# Patient Record
Sex: Female | Born: 1945 | Race: White | Hispanic: No | Smoking: Former smoker
Health system: Southern US, Community
[De-identification: ages and names within clinical notes are randomized; demographics above are authoritative.]

## PROBLEM LIST (undated history)

## (undated) DIAGNOSIS — S2249XA Multiple fractures of ribs, unspecified side, initial encounter for closed fracture: Secondary | ICD-10-CM

## (undated) DIAGNOSIS — C4370 Malignant melanoma of unspecified lower limb, including hip: Secondary | ICD-10-CM

## (undated) DIAGNOSIS — J302 Other seasonal allergic rhinitis: Secondary | ICD-10-CM

## (undated) DIAGNOSIS — Z9289 Personal history of other medical treatment: Secondary | ICD-10-CM

## (undated) DIAGNOSIS — L659 Nonscarring hair loss, unspecified: Secondary | ICD-10-CM

## (undated) DIAGNOSIS — H8110 Benign paroxysmal vertigo, unspecified ear: Secondary | ICD-10-CM

## (undated) DIAGNOSIS — R112 Nausea with vomiting, unspecified: Secondary | ICD-10-CM

## (undated) DIAGNOSIS — Z87442 Personal history of urinary calculi: Secondary | ICD-10-CM

## (undated) DIAGNOSIS — M1990 Unspecified osteoarthritis, unspecified site: Secondary | ICD-10-CM

## (undated) DIAGNOSIS — I340 Nonrheumatic mitral (valve) insufficiency: Secondary | ICD-10-CM

## (undated) DIAGNOSIS — G629 Polyneuropathy, unspecified: Secondary | ICD-10-CM

## (undated) DIAGNOSIS — T7840XA Allergy, unspecified, initial encounter: Secondary | ICD-10-CM

## (undated) DIAGNOSIS — M8580 Other specified disorders of bone density and structure, unspecified site: Secondary | ICD-10-CM

## (undated) DIAGNOSIS — I480 Paroxysmal atrial fibrillation: Secondary | ICD-10-CM

## (undated) DIAGNOSIS — M810 Age-related osteoporosis without current pathological fracture: Secondary | ICD-10-CM

## (undated) DIAGNOSIS — T8859XA Other complications of anesthesia, initial encounter: Secondary | ICD-10-CM

## (undated) DIAGNOSIS — B009 Herpesviral infection, unspecified: Secondary | ICD-10-CM

## (undated) DIAGNOSIS — F3281 Premenstrual dysphoric disorder: Secondary | ICD-10-CM

## (undated) DIAGNOSIS — F419 Anxiety disorder, unspecified: Secondary | ICD-10-CM

## (undated) DIAGNOSIS — N94819 Vulvodynia, unspecified: Secondary | ICD-10-CM

## (undated) DIAGNOSIS — I341 Nonrheumatic mitral (valve) prolapse: Secondary | ICD-10-CM

## (undated) DIAGNOSIS — Z9889 Other specified postprocedural states: Secondary | ICD-10-CM

## (undated) DIAGNOSIS — K219 Gastro-esophageal reflux disease without esophagitis: Secondary | ICD-10-CM

## (undated) DIAGNOSIS — N301 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: Secondary | ICD-10-CM

## (undated) HISTORY — DX: Polyneuropathy, unspecified: G62.9

## (undated) HISTORY — PX: FOREHEAD RECONSTRUCTION: SHX429

## (undated) HISTORY — PX: POSTERIOR CERVICAL FUSION/FORAMINOTOMY: SHX5038

## (undated) HISTORY — DX: Premenstrual dysphoric disorder: F32.81

## (undated) HISTORY — DX: Nonrheumatic mitral (valve) prolapse: I34.1

## (undated) HISTORY — DX: Gastro-esophageal reflux disease without esophagitis: K21.9

## (undated) HISTORY — DX: Benign paroxysmal vertigo, unspecified ear: H81.10

## (undated) HISTORY — DX: Herpesviral infection, unspecified: B00.9

## (undated) HISTORY — DX: Malignant melanoma of unspecified lower limb, including hip: C43.70

## (undated) HISTORY — DX: Anxiety disorder, unspecified: F41.9

## (undated) HISTORY — DX: Multiple fractures of ribs, unspecified side, initial encounter for closed fracture: S22.49XA

## (undated) HISTORY — DX: Nonscarring hair loss, unspecified: L65.9

## (undated) HISTORY — PX: SPINAL FUSION: SHX223

## (undated) HISTORY — DX: Nonrheumatic mitral (valve) insufficiency: I34.0

## (undated) HISTORY — PX: BREAST SURGERY: SHX581

## (undated) HISTORY — DX: Allergy, unspecified, initial encounter: T78.40XA

## (undated) HISTORY — PX: INCONTINENCE SURGERY: SHX676

## (undated) HISTORY — PX: BUNIONECTOMY: SHX129

## (undated) HISTORY — DX: 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: N30.10

## (undated) HISTORY — DX: Other specified disorders of bone density and structure, unspecified site: M85.80

## (undated) HISTORY — DX: Age-related osteoporosis without current pathological fracture: M81.0

## (undated) HISTORY — PX: BACK SURGERY: SHX140

## (undated) HISTORY — DX: Paroxysmal atrial fibrillation: I48.0

## (undated) HISTORY — PX: TUBAL LIGATION: SHX77

## (undated) HISTORY — PX: DILATION AND CURETTAGE OF UTERUS: SHX78

## (undated) HISTORY — DX: Vulvodynia, unspecified: N94.819

## (undated) HISTORY — PX: ANTERIOR CERVICAL DECOMP/DISCECTOMY FUSION: SHX1161

## (undated) HISTORY — PX: CYSTOSCOPY W/ STONE MANIPULATION: SHX1427

## (undated) HISTORY — PX: VAGINAL HYSTERECTOMY: SUR661

---

## 1997-11-15 ENCOUNTER — Other Ambulatory Visit: Admission: RE | Admit: 1997-11-15 | Discharge: 1997-11-15 | Payer: Self-pay | Admitting: *Deleted

## 1998-12-14 ENCOUNTER — Other Ambulatory Visit: Admission: RE | Admit: 1998-12-14 | Discharge: 1998-12-14 | Payer: Self-pay | Admitting: Obstetrics and Gynecology

## 1999-07-10 ENCOUNTER — Emergency Department (HOSPITAL_COMMUNITY): Admission: EM | Admit: 1999-07-10 | Discharge: 1999-07-10 | Payer: Self-pay

## 1999-07-19 ENCOUNTER — Ambulatory Visit (HOSPITAL_COMMUNITY): Admission: RE | Admit: 1999-07-19 | Discharge: 1999-07-19 | Payer: Self-pay | Admitting: *Deleted

## 1999-07-19 ENCOUNTER — Encounter: Payer: Self-pay | Admitting: *Deleted

## 1999-10-24 ENCOUNTER — Encounter: Payer: Self-pay | Admitting: *Deleted

## 1999-10-24 ENCOUNTER — Encounter: Admission: RE | Admit: 1999-10-24 | Discharge: 1999-10-24 | Payer: Self-pay | Admitting: *Deleted

## 1999-11-14 ENCOUNTER — Other Ambulatory Visit: Admission: RE | Admit: 1999-11-14 | Discharge: 1999-11-14 | Payer: Self-pay | Admitting: Obstetrics and Gynecology

## 1999-12-13 ENCOUNTER — Encounter: Admission: RE | Admit: 1999-12-13 | Discharge: 2000-03-12 | Payer: Self-pay | Admitting: Anesthesiology

## 2001-05-24 ENCOUNTER — Ambulatory Visit (HOSPITAL_COMMUNITY): Admission: RE | Admit: 2001-05-24 | Discharge: 2001-05-24 | Payer: Self-pay | Admitting: Gastroenterology

## 2001-10-26 ENCOUNTER — Other Ambulatory Visit: Admission: RE | Admit: 2001-10-26 | Discharge: 2001-10-26 | Payer: Self-pay | Admitting: Obstetrics and Gynecology

## 2002-01-22 ENCOUNTER — Emergency Department (HOSPITAL_COMMUNITY): Admission: EM | Admit: 2002-01-22 | Discharge: 2002-01-22 | Payer: Self-pay | Admitting: Emergency Medicine

## 2002-05-05 ENCOUNTER — Inpatient Hospital Stay (HOSPITAL_COMMUNITY): Admission: RE | Admit: 2002-05-05 | Discharge: 2002-05-07 | Payer: Self-pay | Admitting: Orthopaedic Surgery

## 2002-05-05 ENCOUNTER — Encounter: Payer: Self-pay | Admitting: Orthopaedic Surgery

## 2002-05-07 ENCOUNTER — Encounter: Payer: Self-pay | Admitting: Orthopaedic Surgery

## 2003-11-08 ENCOUNTER — Ambulatory Visit (HOSPITAL_BASED_OUTPATIENT_CLINIC_OR_DEPARTMENT_OTHER): Admission: RE | Admit: 2003-11-08 | Discharge: 2003-11-08 | Payer: Self-pay | Admitting: *Deleted

## 2003-11-08 ENCOUNTER — Ambulatory Visit (HOSPITAL_COMMUNITY): Admission: RE | Admit: 2003-11-08 | Discharge: 2003-11-08 | Payer: Self-pay | Admitting: *Deleted

## 2003-11-08 ENCOUNTER — Encounter (INDEPENDENT_AMBULATORY_CARE_PROVIDER_SITE_OTHER): Payer: Self-pay | Admitting: Specialist

## 2003-12-06 ENCOUNTER — Other Ambulatory Visit: Admission: RE | Admit: 2003-12-06 | Discharge: 2003-12-06 | Payer: Self-pay | Admitting: Family Medicine

## 2004-06-12 ENCOUNTER — Ambulatory Visit (HOSPITAL_COMMUNITY): Admission: RE | Admit: 2004-06-12 | Discharge: 2004-06-12 | Payer: Self-pay | Admitting: Family Medicine

## 2004-07-09 ENCOUNTER — Ambulatory Visit (HOSPITAL_COMMUNITY): Admission: RE | Admit: 2004-07-09 | Discharge: 2004-07-09 | Payer: Self-pay | Admitting: Family Medicine

## 2004-07-16 ENCOUNTER — Encounter: Admission: RE | Admit: 2004-07-16 | Discharge: 2004-07-16 | Payer: Self-pay | Admitting: Cardiology

## 2004-07-22 ENCOUNTER — Ambulatory Visit: Payer: Self-pay | Admitting: Internal Medicine

## 2004-10-22 ENCOUNTER — Ambulatory Visit: Payer: Self-pay | Admitting: Internal Medicine

## 2005-01-20 ENCOUNTER — Ambulatory Visit: Payer: Self-pay | Admitting: Internal Medicine

## 2005-03-18 ENCOUNTER — Ambulatory Visit (HOSPITAL_COMMUNITY): Admission: RE | Admit: 2005-03-18 | Discharge: 2005-03-18 | Payer: Self-pay | Admitting: Neurosurgery

## 2005-04-15 ENCOUNTER — Encounter (INDEPENDENT_AMBULATORY_CARE_PROVIDER_SITE_OTHER): Payer: Self-pay | Admitting: *Deleted

## 2005-04-15 ENCOUNTER — Ambulatory Visit (HOSPITAL_COMMUNITY): Admission: RE | Admit: 2005-04-15 | Discharge: 2005-04-15 | Payer: Self-pay | Admitting: Neurosurgery

## 2005-04-16 ENCOUNTER — Ambulatory Visit: Payer: Self-pay | Admitting: Internal Medicine

## 2005-12-08 ENCOUNTER — Other Ambulatory Visit: Admission: RE | Admit: 2005-12-08 | Discharge: 2005-12-08 | Payer: Self-pay | Admitting: Family Medicine

## 2006-03-12 ENCOUNTER — Encounter: Admission: RE | Admit: 2006-03-12 | Discharge: 2006-03-12 | Payer: Self-pay | Admitting: *Deleted

## 2006-09-06 ENCOUNTER — Emergency Department (HOSPITAL_COMMUNITY): Admission: EM | Admit: 2006-09-06 | Discharge: 2006-09-06 | Payer: Self-pay | Admitting: Family Medicine

## 2006-11-06 ENCOUNTER — Emergency Department (HOSPITAL_COMMUNITY): Admission: EM | Admit: 2006-11-06 | Discharge: 2006-11-06 | Payer: Self-pay | Admitting: Emergency Medicine

## 2006-11-19 ENCOUNTER — Ambulatory Visit (HOSPITAL_COMMUNITY): Admission: RE | Admit: 2006-11-19 | Discharge: 2006-11-19 | Payer: Self-pay | Admitting: Orthopaedic Surgery

## 2006-11-26 ENCOUNTER — Ambulatory Visit (HOSPITAL_COMMUNITY): Admission: RE | Admit: 2006-11-26 | Discharge: 2006-11-26 | Payer: Self-pay | Admitting: Orthopaedic Surgery

## 2006-12-09 ENCOUNTER — Encounter: Admission: RE | Admit: 2006-12-09 | Discharge: 2007-01-14 | Payer: Self-pay | Admitting: Orthopaedic Surgery

## 2007-01-27 ENCOUNTER — Ambulatory Visit: Payer: Self-pay | Admitting: Vascular Surgery

## 2007-01-27 ENCOUNTER — Emergency Department (HOSPITAL_COMMUNITY): Admission: EM | Admit: 2007-01-27 | Discharge: 2007-01-27 | Payer: Self-pay | Admitting: Emergency Medicine

## 2007-01-27 ENCOUNTER — Encounter (INDEPENDENT_AMBULATORY_CARE_PROVIDER_SITE_OTHER): Payer: Self-pay | Admitting: Emergency Medicine

## 2007-02-04 ENCOUNTER — Ambulatory Visit (HOSPITAL_COMMUNITY): Admission: RE | Admit: 2007-02-04 | Discharge: 2007-02-04 | Payer: Self-pay | Admitting: Orthopaedic Surgery

## 2007-02-12 ENCOUNTER — Encounter: Admission: RE | Admit: 2007-02-12 | Discharge: 2007-02-12 | Payer: Self-pay | Admitting: Orthopaedic Surgery

## 2007-04-30 ENCOUNTER — Emergency Department (HOSPITAL_COMMUNITY): Admission: EM | Admit: 2007-04-30 | Discharge: 2007-04-30 | Payer: Self-pay | Admitting: Family Medicine

## 2007-05-21 ENCOUNTER — Ambulatory Visit (HOSPITAL_BASED_OUTPATIENT_CLINIC_OR_DEPARTMENT_OTHER): Admission: RE | Admit: 2007-05-21 | Discharge: 2007-05-21 | Payer: Self-pay | Admitting: Urology

## 2007-05-21 ENCOUNTER — Encounter (INDEPENDENT_AMBULATORY_CARE_PROVIDER_SITE_OTHER): Payer: Self-pay | Admitting: Urology

## 2007-06-11 ENCOUNTER — Encounter: Admission: RE | Admit: 2007-06-11 | Discharge: 2007-06-11 | Payer: Self-pay | Admitting: Neurosurgery

## 2007-06-14 ENCOUNTER — Emergency Department (HOSPITAL_COMMUNITY): Admission: EM | Admit: 2007-06-14 | Discharge: 2007-06-14 | Payer: Self-pay | Admitting: Emergency Medicine

## 2007-08-30 ENCOUNTER — Other Ambulatory Visit: Admission: RE | Admit: 2007-08-30 | Discharge: 2007-08-30 | Payer: Self-pay | Admitting: Gynecology

## 2007-10-27 ENCOUNTER — Ambulatory Visit (HOSPITAL_BASED_OUTPATIENT_CLINIC_OR_DEPARTMENT_OTHER): Admission: RE | Admit: 2007-10-27 | Discharge: 2007-10-27 | Payer: Self-pay | Admitting: Urology

## 2008-03-03 ENCOUNTER — Ambulatory Visit (HOSPITAL_COMMUNITY): Admission: RE | Admit: 2008-03-03 | Discharge: 2008-03-03 | Payer: Self-pay | Admitting: Neurosurgery

## 2008-03-08 ENCOUNTER — Ambulatory Visit (HOSPITAL_COMMUNITY): Admission: RE | Admit: 2008-03-08 | Discharge: 2008-03-08 | Payer: Self-pay | Admitting: Neurosurgery

## 2008-03-17 ENCOUNTER — Ambulatory Visit: Payer: Self-pay | Admitting: Gynecology

## 2008-04-07 ENCOUNTER — Ambulatory Visit (HOSPITAL_COMMUNITY): Admission: RE | Admit: 2008-04-07 | Discharge: 2008-04-08 | Payer: Self-pay | Admitting: Neurosurgery

## 2008-05-09 ENCOUNTER — Encounter: Admission: RE | Admit: 2008-05-09 | Discharge: 2008-05-09 | Payer: Self-pay | Admitting: Neurosurgery

## 2008-05-11 ENCOUNTER — Ambulatory Visit (HOSPITAL_COMMUNITY): Admission: RE | Admit: 2008-05-11 | Discharge: 2008-05-11 | Payer: Self-pay | Admitting: Neurosurgery

## 2008-06-27 ENCOUNTER — Encounter: Admission: RE | Admit: 2008-06-27 | Discharge: 2008-07-05 | Payer: Self-pay | Admitting: Neurosurgery

## 2008-07-04 ENCOUNTER — Ambulatory Visit: Payer: Self-pay | Admitting: Gynecology

## 2008-07-10 ENCOUNTER — Encounter: Admission: RE | Admit: 2008-07-10 | Discharge: 2008-07-31 | Payer: Self-pay | Admitting: Neurosurgery

## 2008-07-25 ENCOUNTER — Ambulatory Visit: Payer: Self-pay | Admitting: Gynecology

## 2008-08-08 ENCOUNTER — Ambulatory Visit: Payer: Self-pay | Admitting: Gynecology

## 2008-12-12 ENCOUNTER — Emergency Department (HOSPITAL_COMMUNITY): Admission: EM | Admit: 2008-12-12 | Discharge: 2008-12-12 | Payer: Self-pay | Admitting: Family Medicine

## 2008-12-15 ENCOUNTER — Ambulatory Visit (HOSPITAL_COMMUNITY): Admission: RE | Admit: 2008-12-15 | Discharge: 2008-12-15 | Payer: Self-pay | Admitting: Neurosurgery

## 2008-12-20 ENCOUNTER — Encounter: Admission: RE | Admit: 2008-12-20 | Discharge: 2008-12-20 | Payer: Self-pay | Admitting: Family Medicine

## 2009-01-04 ENCOUNTER — Other Ambulatory Visit: Admission: RE | Admit: 2009-01-04 | Discharge: 2009-01-04 | Payer: Self-pay | Admitting: Gynecology

## 2009-01-04 ENCOUNTER — Ambulatory Visit: Payer: Self-pay | Admitting: Gynecology

## 2009-01-04 ENCOUNTER — Encounter: Payer: Self-pay | Admitting: Gynecology

## 2009-01-05 ENCOUNTER — Ambulatory Visit (HOSPITAL_COMMUNITY): Admission: RE | Admit: 2009-01-05 | Discharge: 2009-01-05 | Payer: Self-pay | Admitting: Neurosurgery

## 2009-01-11 ENCOUNTER — Ambulatory Visit: Payer: Self-pay | Admitting: Women's Health

## 2009-02-22 ENCOUNTER — Ambulatory Visit: Payer: Self-pay | Admitting: Physical Medicine & Rehabilitation

## 2009-03-23 ENCOUNTER — Ambulatory Visit: Payer: Self-pay | Admitting: Gynecology

## 2009-03-27 ENCOUNTER — Encounter
Admission: RE | Admit: 2009-03-27 | Discharge: 2009-06-25 | Payer: Self-pay | Admitting: Physical Medicine & Rehabilitation

## 2009-03-30 ENCOUNTER — Ambulatory Visit: Payer: Self-pay | Admitting: Physical Medicine & Rehabilitation

## 2009-04-05 ENCOUNTER — Ambulatory Visit: Payer: Self-pay | Admitting: Physical Medicine & Rehabilitation

## 2009-04-10 ENCOUNTER — Encounter
Admission: RE | Admit: 2009-04-10 | Discharge: 2009-05-07 | Payer: Self-pay | Admitting: Physical Medicine & Rehabilitation

## 2009-05-03 ENCOUNTER — Ambulatory Visit: Payer: Self-pay | Admitting: Physical Medicine & Rehabilitation

## 2009-05-14 ENCOUNTER — Ambulatory Visit: Payer: Self-pay | Admitting: Physical Medicine & Rehabilitation

## 2009-05-29 ENCOUNTER — Ambulatory Visit: Payer: Self-pay | Admitting: Physical Medicine & Rehabilitation

## 2009-05-30 ENCOUNTER — Ambulatory Visit: Payer: Self-pay | Admitting: Gynecology

## 2009-07-09 ENCOUNTER — Ambulatory Visit: Payer: Self-pay | Admitting: Gynecology

## 2009-07-23 DIAGNOSIS — M5412 Radiculopathy, cervical region: Secondary | ICD-10-CM | POA: Insufficient documentation

## 2009-07-23 DIAGNOSIS — M4120 Other idiopathic scoliosis, site unspecified: Secondary | ICD-10-CM | POA: Insufficient documentation

## 2009-08-09 DIAGNOSIS — N390 Urinary tract infection, site not specified: Secondary | ICD-10-CM | POA: Insufficient documentation

## 2009-08-09 DIAGNOSIS — N200 Calculus of kidney: Secondary | ICD-10-CM | POA: Insufficient documentation

## 2009-10-23 DIAGNOSIS — A6004 Herpesviral vulvovaginitis: Secondary | ICD-10-CM | POA: Insufficient documentation

## 2009-11-01 DIAGNOSIS — A6000 Herpesviral infection of urogenital system, unspecified: Secondary | ICD-10-CM | POA: Insufficient documentation

## 2009-11-01 DIAGNOSIS — B009 Herpesviral infection, unspecified: Secondary | ICD-10-CM | POA: Insufficient documentation

## 2009-12-04 ENCOUNTER — Ambulatory Visit: Payer: Self-pay | Admitting: Gynecology

## 2009-12-26 ENCOUNTER — Ambulatory Visit: Payer: Self-pay | Admitting: Gynecology

## 2010-01-14 ENCOUNTER — Other Ambulatory Visit: Admission: RE | Admit: 2010-01-14 | Discharge: 2010-01-14 | Payer: Self-pay | Admitting: Gynecology

## 2010-01-14 ENCOUNTER — Ambulatory Visit: Payer: Self-pay | Admitting: Gynecology

## 2010-01-25 DIAGNOSIS — D485 Neoplasm of uncertain behavior of skin: Secondary | ICD-10-CM | POA: Insufficient documentation

## 2010-01-25 DIAGNOSIS — M899 Disorder of bone, unspecified: Secondary | ICD-10-CM | POA: Insufficient documentation

## 2010-01-28 DIAGNOSIS — E559 Vitamin D deficiency, unspecified: Secondary | ICD-10-CM | POA: Insufficient documentation

## 2010-02-05 DIAGNOSIS — R5383 Other fatigue: Secondary | ICD-10-CM | POA: Insufficient documentation

## 2010-02-05 DIAGNOSIS — R5381 Other malaise: Secondary | ICD-10-CM | POA: Insufficient documentation

## 2010-02-16 ENCOUNTER — Emergency Department (HOSPITAL_COMMUNITY): Admission: EM | Admit: 2010-02-16 | Discharge: 2010-02-16 | Payer: Self-pay | Admitting: Family Medicine

## 2010-03-04 DIAGNOSIS — F419 Anxiety disorder, unspecified: Secondary | ICD-10-CM | POA: Insufficient documentation

## 2010-03-04 DIAGNOSIS — F32A Depression, unspecified: Secondary | ICD-10-CM | POA: Insufficient documentation

## 2010-03-14 ENCOUNTER — Ambulatory Visit: Payer: Self-pay | Admitting: Gynecology

## 2010-03-25 ENCOUNTER — Ambulatory Visit: Payer: Self-pay | Admitting: Gynecology

## 2010-04-09 DIAGNOSIS — N94819 Vulvodynia, unspecified: Secondary | ICD-10-CM | POA: Insufficient documentation

## 2010-04-25 DIAGNOSIS — J309 Allergic rhinitis, unspecified: Secondary | ICD-10-CM | POA: Insufficient documentation

## 2010-05-23 ENCOUNTER — Ambulatory Visit: Payer: Self-pay | Admitting: Gynecology

## 2010-07-05 ENCOUNTER — Ambulatory Visit: Admit: 2010-07-05 | Payer: Self-pay | Admitting: Gynecology

## 2010-07-07 DIAGNOSIS — I480 Paroxysmal atrial fibrillation: Secondary | ICD-10-CM

## 2010-07-07 HISTORY — PX: SHOULDER ARTHROSCOPY W/ ROTATOR CUFF REPAIR: SHX2400

## 2010-07-07 HISTORY — DX: Paroxysmal atrial fibrillation: I48.0

## 2010-07-28 ENCOUNTER — Encounter: Payer: Self-pay | Admitting: Neurosurgery

## 2010-07-28 ENCOUNTER — Encounter: Payer: Self-pay | Admitting: Orthopaedic Surgery

## 2010-07-29 ENCOUNTER — Encounter: Payer: Self-pay | Admitting: Neurosurgery

## 2010-07-29 ENCOUNTER — Encounter: Payer: Self-pay | Admitting: Orthopedic Surgery

## 2010-08-13 ENCOUNTER — Ambulatory Visit
Admission: RE | Admit: 2010-08-13 | Discharge: 2010-08-13 | Disposition: A | Discharge status: Home / Self Care | Payer: PRIVATE HEALTH INSURANCE | Source: Ambulatory Visit | Attending: Cardiology | Admitting: Cardiology

## 2010-08-13 ENCOUNTER — Other Ambulatory Visit: Payer: Self-pay | Admitting: Cardiology

## 2010-08-13 DIAGNOSIS — R0789 Other chest pain: Secondary | ICD-10-CM

## 2010-11-19 NOTE — Op Note (Signed)
NAMEVIANKA, [name]                   ACCOUNT NO.:  [id number]   MEDICAL RECORD NO.:  [id number]          PATIENT TYPE:  AMB   LOCATION:  [facility]                         FACILITY:  [facility]   PHYSICIAN:  Valetta Fuller, M.D.  DATE OF BIRTH:  1945/12/02   DATE OF PROCEDURE:  10/27/2007  DATE OF DISCHARGE:                               OPERATIVE REPORT   PREOPERATIVE DIAGNOSES:  1. Questionable right distal ureteral calculus.  2. Chronic pelvic pain, rule out interstitial cystitis.   POSTOPERATIVE DIAGNOSES:  1. No ureteral stone noted.  2. 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   PROCEDURE PERFORMED:  Cystoscopy, right retrograde pyelogram, right  ureteroscopy with cystoscopy and hydraulic over distention of the  bladder and insertion of Foley catheter.   SURGEON:  Barron Alvine, M.D.   Threasa HeadsBufford Buttner.   ANESTHESIA:  General.   INDICATIONS:  [name] is a 65 year old female.  She has previously been  under the care of Dr. Earlene Plater.  She has had a history of nephrolithiasis  and came to see me for the first time recently.  Over the past several  months, she has had a total of six CT scans.  She had been having some  right-sided abdominal discomfort and her most recent two CT scans had  suggested a probable continued presence of a 2 mm distal ureteral  calculus.  She had been scheduled to have surgery by Dr. Earlene Plater, but  elected to postpone that surgery.  She came in to see me.  At that point  she was still having some intermittent lingering right-sided abdominal  discomfort, certainly potentially consistent with some intermittent  colic.  She had also had a longstanding history of chronic pelvic pain  along with irritative voiding symptoms and certainly had had  questionable diagnosis of interstitial cystitis in the past.  At that  point it was difficult to know whether the stone was still present or  not.  We felt that given her sixth recent CT scans it would be prudent  to just determine  definitively whether a stone was present or not and at  this point to go ahead and manage it definitively.  We also suggested  repeat assessment for interstitial cystitis with hydraulic over  distention of the bladder for both diagnostic and therapeutic purposes.  She accepted that recommendation and actually wanted to proceed with  definitive assessment.  The patient appeared to understand the  advantages, disadvantages, potential complications of this approach.  A  full informed consent was obtained.   TECHNIQUE AND FINDINGS:  The patient was brought to the operating room.  She had successful induction of general anesthesia and was placed in the  lithotomy position.  With full relaxation, one could appreciate that she  did indeed have a grade 3 cystocele with what appeared to be fairly  significant vaginal thinning.  Cystoscopy showed some lateral  displacement of her ureteral orifices, but no other bladder pathology.  Retrograde pyelogram was done with fluoroscopic assessment and an 8-  French cone-tip catheter.  We did not see any evidence of obstruction in  the ureter and collecting system appeared to be nonobstructing and not  particularly dilated.  Because of the small size of that stone, however,  I elected to place a guidewire up to the right renal pelvis and the  distal ureter was easily engaged with the mini ureteroscope.  We  performed ureteroscopy on the distal third of the ureter, saw no  evidence of stone at this time.  The patient was not felt to require a  double-J stent due to the atraumatic nature of the ureteroscopy.  We  went ahead then with hydraulic over distention of the bladder.  After  approximately 1500 mL was noted in her bladder, there appeared to be a  restart of the flow of irrigant.  Careful cystoscopy showed some mucosal  separation above the trigone near the posterior right side of the  bladder.  For that reason, hydraulic over distention was stopped and  the  bladder was drained.  Her capacity was over 1000 mL.  She did have some  mild to moderate glomerular hemorrhaging.  Once her bladder was  completely evacuated, the suprapubic area was carefully palpated.  There  did not appear to be any fullness and we did not feel that the patient  had a significant amount of extravasation of any of the irrigant fluid.  Given the mucosal separation however, we felt it prudent to leave a  foley catheter for a period of time to allow that to re-epithelialize  and to prevent any extravasation of urine out into the retroperitoneal  space.  I did not feel a cystogram would be really likely to change my  management since I was going to go ahead with a Foley catheter on a  temporary basis.  A catheter was placed and urine was light pink in  color.  She was brought to recovery room in stable condition without any  other obvious problems.           ______________________________  Valetta Fuller, M.D.  Electronically Signed     DSG/MEDQ  D:  10/28/2007  T:  10/28/2007  Job:  [id number]

## 2010-11-19 NOTE — Op Note (Signed)
NAMEWANITA, DERENZO                   ACCOUNT NO.:  [id number]   MEDICAL RECORD NO.:  [id number]          PATIENT TYPE:  INP   LOCATION:  3534                         FACILITY:  [facility]   PHYSICIAN:  Sherilyn Cooter A. Pool, M.D.    DATE OF BIRTH:  03-02-1946   DATE OF PROCEDURE:  04/07/2008  DATE OF DISCHARGE:                               OPERATIVE REPORT   PREOPERATIVE DIAGNOSIS:  C6-C7 pseudoarthrosis with intractable neck  pain.   POSTOPERATIVE DIAGNOSIS:  C6-C7 pseudoarthrosis with intractable neck  pain.   PROCEDURE:  C6-C7 posterior cervical fusion with nonsegmental lateral  mass and pedicle screw fixation and interspinous wiring with local  autografting, bone graft extender, bone morphogenic protein, and  bilateral C6-C7 decompressive foraminotomies.   SURGEON:  Kathaleen Maser. Pool, MD   ASSISTANT:  Donalee Citrin, MD   ANESTHESIA:  General endotracheal.   INDICATIONS:  Ms. Bickle is a 65 year old female status post previous C5-  C6 and C6-C7 anterior cervical diskectomy and fusion by another  physician.  The patient has persistent neck pain failing conservative  management.  Workup demonstrates evidence of a significant  pseudoarthrosis at C6-C7.  The patient appears to be solidly fused at C5-  C6.  Plan is for posterior cervical fusion at C6-C7.   OPERATION:  The patient was brought to the operating room and placed on  the operating table in a supine position.  After adequate level of  anesthesia was achieved, the patient was placed prone onto a bolster  with her head fixed in neutral head position using a Mayfield pin  headrest.  The patient's posterior cervical region was prepped and  draped sterilely.  A #10 blade was used to make a curvilinear incision  extending from C5 down to C7.  This was carried down sharply in midline.  A subperiosteal dissection was performed exposing the lamina and facet  joints of C5, C6, and C7.  Deep self-retaining retractor was placed.  Intraoperative  fluoroscopy was used and levels were confirmed.  Laminotomies at C6-C7 were then performed bilaterally for identification  and decompression of the C7 nerve roots.  Once the C7 nerve roots were  decompressed, the pedicles of C7 were identified bilaterally to ease in  later pedicle screw placement.  Lateral most entry points at C6 were  then identified bilaterally.  Pilot holes were drilled bilaterally.  The  pilot hole on the left side broke out laterally and could not be  salvaged.  Since the patient was already fused at C5-C6, it was decided  to take the screw up to C5 at this level.  A pilot hole was placed at  C5.  This was then drilled approximately 20 degrees cephalad and 20  degrees laterally.  A 14-mm vertex screw was then placed at C5 on the  left.  A 14-mm screw was placed at C6 on the right.  The pedicles were  then identified at C7.  These were then drilled under direct  visualization of the pedicle.  Each pedicle tract was probed and found  to be solid within bone.  The 16-mm vertex screws were placed  bilaterally at C7 within the pedicle.  There was no evidence of pedicle  breach or other complication.  Interspinous wiring using a Atlas cable  was then performed at C6 and C7.  This was done by making a hole through  the spinous processes using a towel clip, threading the wires and then  compressing the wire and locking in place.  The lamina and facet joints  were then decorticated using high-speed drill.  Morselized autograft was  packed posterolaterally for later fusion.  Bone morphogenic protein  mixed with a mosaic bone sponge matrix was then placed bilaterally.  Wound was then inspected.  Hemostasis was good.  Final images revealed  good position of bone grafts and hardware with normal alignment of  spine.  The wound was  then closed in layers with Vicryl sutures.  Steri-Strips and sterile  dressings were applied.  There were no complications.  The patient  tolerated the  procedure well and she returned to the recovery room for  postoperative care.           ______________________________  Kathaleen Maser. Pool, M.D.     HAP/MEDQ  D:  04/07/2008  T:  04/07/2008  Job:  [id number]

## 2010-11-19 NOTE — Op Note (Signed)
NAMEBRITTYN, [name]                   ACCOUNT NO.:  [id number]   MEDICAL RECORD NO.:  [id number]          PATIENT TYPE:  AMB   LOCATION:  [facility]                         FACILITY:  [facility]   PHYSICIAN:  Ronald L. Earlene Plater, M.D.  DATE OF BIRTH:  19-Jan-1946   DATE OF PROCEDURE:  05/21/2007  DATE OF DISCHARGE:                               OPERATIVE REPORT   DIAGNOSIS:  Questionable interstitial cystitis.   OPERATIVE PROCEDURE:  Cysto, hydraulic bladder distention and biopsy.   SURGEON:  Laurin Coder, MD   ANESTHESIA:  LMA.   ESTIMATED BLOOD LOSS:  Negligible.   TUBES:  None.   COMPLICATIONS:  None.   TOTAL CAPACITY:  Was 1000 mL at 80 cm of water.  There were no  significant glomerulations seen.   INDICATIONS FOR PROCEDURE:  [name] is a very nice 65 year old white female  who has had urgency and frequency and suprapubic pain and vaginal pain  for some time.  She has recently diagnosed with herpes simplex virus but  with the continued bladder pain we felt that interstitial cystitis  certainly was a possibility.  After understanding the risks, benefits  and alternatives she elected to proceed with above diagnostic and  therapeutic procedure.   PROCEDURE IN DETAIL:  The patient was placed in supine position and  after proper LMA anesthesia was placed in the dorsal lithotomy position,  prepped and draped with Betadine in a sterile fashion.  Cystourethroscopy was performed with a 22.5 French Olympus panendoscope  utilizing a 12 and 70 degree lenses.  The bladder was carefully  inspected and noted to be without lesions.  Efflux of clear urine was  noted with normally placed ureteral orifices bilaterally.  The bladder  was subsequently distended to 80 cm of water pressure.  The total  capacity was just over 1000 mL.  Reintubation of the bladder revealed  some diffuse inflammation but no frank Hunner's ulcers, cracks or  glomerulations were noted.  Biopsies were taken from the posterior  midline and submitted for pathology.  The base was cauterized with  Bugbee coagulation cautery.  No other lesions were noted to be present.  The bladder was drained, panendoscope was removed.  The specimens were  submitted to pathology.      Ronald L. Earlene Plater, M.D.  Electronically Signed    RLD/MEDQ  D:  05/21/2007  T:  05/22/2007  Job:  [id number]

## 2010-11-20 ENCOUNTER — Ambulatory Visit
Admission: RE | Admit: 2010-11-20 | Discharge: 2010-11-20 | Disposition: A | Discharge status: Home / Self Care | Payer: PRIVATE HEALTH INSURANCE | Source: Ambulatory Visit | Attending: Neurosurgery | Admitting: Neurosurgery

## 2010-11-20 ENCOUNTER — Other Ambulatory Visit: Payer: Self-pay | Admitting: Neurosurgery

## 2010-11-20 DIAGNOSIS — M542 Cervicalgia: Secondary | ICD-10-CM

## 2010-11-22 NOTE — H&P (Signed)
NAME:  [name], [name]                             ACCOUNT NO.:  [id number]   MEDICAL RECORD NO.:  [id number]                   PATIENT TYPE:  INP   LOCATION:  0444                                 FACILITY:  [facility]   PHYSICIAN:  Sharolyn Douglas, M.D.                     DATE OF BIRTH:  03/17/1946   DATE OF ADMISSION:  05/05/2002  DATE OF DISCHARGE:  05/07/2002                                HISTORY & PHYSICAL   CHIEF COMPLAINT:  Neck and right upper extremity pain.   HISTORY OF PRESENT ILLNESS:  The patient is a 65 year old female with neck  and right arm pain and numbness since July 2003.  She has failed numerous  methods of conservative management.  The pain is to the point that it is  near constant in nature.  She is unable to perform her activities of daily  living.  Her pain is severe, keeps her up at night.  Risks/benefits of  surgery were discussed with the patient by Dr. Sharolyn Douglas.  She had been  given understanding and opted to proceed.   ALLERGIES:  PENICILLIN causing nausea, vomiting, tachycardia.  IV DYE causes  hives.  MORPHINE causes depression.  ERYTHROMYCIN causes nausea, vomiting,  tachycardia.   MEDICATIONS:  1. Lexapro 10 mg p.o. q.d.  2. Fosamax 70 mg per week.  3. Lorazepam p.r.n.   PAST MEDICAL HISTORY:  1. 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  2. Anxiety.  3. Mitral valve prolapse.  4. Osteoarthritis.   PAST SURGICAL HISTORY:  1. Hysterectomy.  2. Breast biopsy which was benign.  3. Bunion surgery.  4. Uterine mass, removed.   SOCIAL HISTORY:  The patient denies tobacco use.  Denies alcohol use.  She  is divorced.  She works as an Environmental health practitioner.  She has one 32-year-  old son.  Postoperatively her sister as well as her son are going to help  her.   FAMILY HISTORY:  Mother deceased in 56s secondary to coronary artery  disease, MI.  Father deceased age 81 with diabetes and hypertension.   REVIEW OF SYSTEMS:  The patient denies any fevers, chills, sweats,  bleeding  tendencies.  CNS:  Denies blurred vision, double vision, seizures, headache,  paralysis.  CARDIOVASCULAR:  Denies chest pain, angina, orthopnea,  claudication, palpitations.  PULMONARY:  Denies shortness of breath,  productive cough, or hemoptysis.  GASTROINTESTINAL:  Has nausea, vomiting,  constipation, diarrhea, melena, bloody stools.  GENITOURINARY:  Denies  dysuria, hematuria, or discharge.  MUSCULOSKELETAL:  As per HPI.   PHYSICAL EXAMINATION:  VITAL SIGNS:  Blood pressure 114/72, respirations 16  and unlabored, pulse 80 and regular.  GENERAL:  The patient is a 65 year old white female who is alert and  oriented, in no acute distress, well-nourished, well groomed.  The patient  is stated age.  Pleasant, cooperative to examination.  HEENT:  Normocephalic, atraumatic.  Pupils are equal, round, and reactive to  light.  Extraocular movements are intact.  Nares patent.  Pharynx clear.  NECK:  Soft to palpation.  No bruits appreciated.  No lymphadenopathy or  thyromegaly noted.  CHEST:  Clear to auscultation bilaterally.  No rales, rhonchi, strider,  wheezes, or friction rubs.  BREASTS:  Not pertinent.  Not performed.  HEART:  S1, S2.  Regular rate and rhythm.  No murmur, rub, or gallop noted.  ABDOMEN:  Soft to palpation.  Positive bowel sounds.  Nontender,  nondistended.  No organomegaly noted.  GENITOURINARY:  Not pertinent.  Not performed.  EXTREMITIES:  The patient walks with a normal gait.  She is able to heel and  toe walk.  Balance is normal.  Romberg is negative.  Sensation is decreased  in the right C6-7 distribution.  Strength is 4/5 in wrist extensors,  gastrocs, __________  flexors, and grip.  Left strength is 5/5.  Reflexes  are 2+ at the biceps bilaterally, 2+ at the brachioradialis bilaterally, 1+  in the right triceps, and 2+ in the left triceps.  Babinski, clonus and  Hoffmann are negative.  Impingement test is negative for shoulder; Spurling  maneuver is  positive for right shoulder and arm pain.  Adson's maneuver was  negative.  SKIN:  Intact without any lesions or rashes.   LABORATORIES:  X-rays show a loss of normal cervical lordosis, degenerative  changes most at C5-6 and C6-7.  MRI of cervical spine shows moderate sized  posterolateral and medial foraminal disk protrusion on the right C6-7 and  small __________  disk protrusion on the left at C5-6.   IMPRESSION:  1. Herniated nucleus pulposus at C5-6 and C6-7.  2. Mitral valve prolapse.  3. Anxiety.   PLAN:  1. Admit to [facility] May 05, 2002 for __________  C5-6, C6-7     to be done by Dr. Sharolyn Douglas.  2. The patient's primary care physician is Dr. Gerri Spore.     Verlin Fester, P.A.                       Sharolyn Douglas, M.D.    CM/MEDQ  D:  05/09/2002  T:  05/09/2002  Job:  [id number]

## 2010-11-22 NOTE — Procedures (Signed)
[facility]  Patient:    [name], [name]                            MRN: [id number] Proc. Date: 01/02/00 Adm. Date:  [id number] Attending:  Thyra Breed CC:         Lillia Dallas. Murray Hodgkins, M.D.                           Procedure Report  PROCEDURE:  Lumbar epidural steroid injection.  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  INTERVAL HISTORY:  The patient noted partial improvement after her SI joint injection. She continues to have pain that radiates out into her hip region. It is brought on by walking an improved by rest. She continues on her Fosamax, serrefine, Advil and ______.  PHYSICAL EXAMINATION:  VITAL SIGNS:  Blood pressure is 102/56, heart rate is 78, respiratory rate 16, O2 saturations 96% an pain level is 6/10.  Deep tendon reflexes were negative with negative straight leg raise signs. Motor is 5/5 with symmetric bulk and tone. Sensory is grossly intact. Her back demonstrated some mild scoliosis in the thoracolumbar region. Straight leg raise signs are negative.  DESCRIPTION OF PROCEDURE:  After informed consent was obtained, the patient was placed in the sitting position and monitored. The patients back was prepped with Betadine x 3. A skin wheal was raised at the L4-5 interspace with 1 percent lidocaine. A 20 gauge Tuohy needle was introduced to the lumbar epidural space to loss of resistance to preservative free normal saline. There was no cerebrospinal fluid nor blood. 80 mg of Medrol and 10 ml of preservative free normal saline was very slowly injected. The needle was flushed with preservative free normal saline and removed intact.  CONDITION POST PROCEDURE:  Stable.  DISCHARGE INSTRUCTIONS:  Resume previous diet. Limitations in activities pe instruction sheet. Continue on current medications. The patient plans to follow-up with Dr. Murray Hodgkins. He requested 1 injection today. She apparently is headed for physical  therapy after this. She is aware that it may take up to 2-5 days before she gets a response to the epidural if she gets a response. DD:  01/02/00 TD:  01/03/00 Job: [id number]

## 2010-11-27 ENCOUNTER — Other Ambulatory Visit: Payer: Self-pay | Admitting: Neurosurgery

## 2010-11-27 DIAGNOSIS — M545 Low back pain, unspecified: Secondary | ICD-10-CM

## 2010-11-27 DIAGNOSIS — M542 Cervicalgia: Secondary | ICD-10-CM

## 2010-11-30 ENCOUNTER — Ambulatory Visit
Admission: RE | Admit: 2010-11-30 | Discharge: 2010-11-30 | Disposition: A | Discharge status: Home / Self Care | Payer: PRIVATE HEALTH INSURANCE | Source: Ambulatory Visit | Attending: Neurosurgery | Admitting: Neurosurgery

## 2010-11-30 DIAGNOSIS — M545 Low back pain, unspecified: Secondary | ICD-10-CM

## 2010-11-30 DIAGNOSIS — M542 Cervicalgia: Secondary | ICD-10-CM

## 2010-12-06 ENCOUNTER — Other Ambulatory Visit: Payer: PRIVATE HEALTH INSURANCE

## 2010-12-17 ENCOUNTER — Other Ambulatory Visit: Payer: Self-pay | Admitting: Orthopaedic Surgery

## 2010-12-17 DIAGNOSIS — M25511 Pain in right shoulder: Secondary | ICD-10-CM

## 2010-12-21 ENCOUNTER — Ambulatory Visit
Admission: RE | Admit: 2010-12-21 | Discharge: 2010-12-21 | Disposition: A | Discharge status: Home / Self Care | Payer: PRIVATE HEALTH INSURANCE | Source: Ambulatory Visit | Attending: Orthopaedic Surgery | Admitting: Orthopaedic Surgery

## 2010-12-21 DIAGNOSIS — M25511 Pain in right shoulder: Secondary | ICD-10-CM

## 2011-01-17 ENCOUNTER — Other Ambulatory Visit: Payer: Self-pay | Admitting: Dermatology

## 2011-01-21 ENCOUNTER — Other Ambulatory Visit (HOSPITAL_COMMUNITY)
Admission: RE | Admit: 2011-01-21 | Discharge: 2011-01-21 | Disposition: A | Discharge status: Home / Self Care | Payer: PRIVATE HEALTH INSURANCE | Source: Ambulatory Visit | Attending: Gynecology | Admitting: Gynecology

## 2011-01-21 ENCOUNTER — Encounter (INDEPENDENT_AMBULATORY_CARE_PROVIDER_SITE_OTHER): Payer: PRIVATE HEALTH INSURANCE | Admitting: Gynecology

## 2011-01-21 ENCOUNTER — Other Ambulatory Visit: Payer: Self-pay | Admitting: Gynecology

## 2011-01-21 DIAGNOSIS — Z124 Encounter for screening for malignant neoplasm of cervix: Secondary | ICD-10-CM | POA: Insufficient documentation

## 2011-01-21 DIAGNOSIS — M949 Disorder of cartilage, unspecified: Secondary | ICD-10-CM

## 2011-01-21 DIAGNOSIS — E559 Vitamin D deficiency, unspecified: Secondary | ICD-10-CM

## 2011-01-21 DIAGNOSIS — M899 Disorder of bone, unspecified: Secondary | ICD-10-CM

## 2011-01-21 DIAGNOSIS — Z01419 Encounter for gynecological examination (general) (routine) without abnormal findings: Secondary | ICD-10-CM

## 2011-01-22 DIAGNOSIS — Z1211 Encounter for screening for malignant neoplasm of colon: Secondary | ICD-10-CM

## 2011-02-24 ENCOUNTER — Ambulatory Visit: Payer: PRIVATE HEALTH INSURANCE | Admitting: Gynecology

## 2011-03-25 ENCOUNTER — Other Ambulatory Visit: Payer: Self-pay | Admitting: Gynecology

## 2011-03-27 ENCOUNTER — Ambulatory Visit: Payer: PRIVATE HEALTH INSURANCE | Admitting: Physical Medicine & Rehabilitation

## 2011-04-01 LAB — POCT HEMOGLOBIN-HEMACUE
Hemoglobin: 14.4
Operator id: 114531

## 2011-04-07 LAB — CBC
HCT: 42.2
HCT: 39.1
Hemoglobin: 14
Hemoglobin: 13.4
MCHC: 33.2
MCHC: 34.4
MCV: 95
MCV: 94.1
Platelets: 191
Platelets: 194
RBC: 4.45
RBC: 4.15
RDW: 12.3
RDW: 12.5
WBC: 5.9
WBC: 4.8

## 2011-04-07 LAB — BASIC METABOLIC PANEL
BUN: 11
CO2: 30
Calcium: 9.4
Chloride: 105
Creatinine, Ser: 0.58
GFR calc Af Amer: >60
GFR calc non Af Amer: >60
Glucose, Bld: 94
Potassium: 4.2
Sodium: 142

## 2011-04-07 LAB — DIFFERENTIAL
Basophils Absolute: 0
Basophils Relative: 1
Eosinophils Absolute: 0.1
Eosinophils Relative: 3
Lymphocytes Relative: 23
Lymphs Abs: 1.1
Monocytes Absolute: 0.4
Monocytes Relative: 8
Neutro Abs: 3.2
Neutrophils Relative %: 66

## 2011-04-07 LAB — TYPE AND SCREEN
ABO/RH(D): B POS
Antibody Screen: NEGATIVE

## 2011-04-07 LAB — ABO/RH: ABO/RH(D): B POS

## 2011-04-09 DIAGNOSIS — N301 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: Secondary | ICD-10-CM | POA: Insufficient documentation

## 2011-04-14 LAB — DIFFERENTIAL
Basophils Absolute: 0
Basophils Relative: 0
Eosinophils Absolute: 0 — ABNORMAL LOW
Eosinophils Relative: 1
Lymphocytes Relative: 19
Lymphs Abs: 0.8
Monocytes Absolute: 0.3
Monocytes Relative: 6
Neutro Abs: 3.4
Neutrophils Relative %: 74

## 2011-04-14 LAB — COMPREHENSIVE METABOLIC PANEL
ALT: 22
AST: 27
Albumin: 3.7
Alkaline Phosphatase: 62
BUN: 8
CO2: 28
Calcium: 9.2
Chloride: 106
Creatinine, Ser: 0.63
GFR calc Af Amer: >60
GFR calc non Af Amer: >60
Glucose, Bld: 87
Potassium: 3.7
Sodium: 140
Total Bilirubin: 0.8
Total Protein: 6.3

## 2011-04-14 LAB — POCT CARDIAC MARKERS
CKMB, poc: 1.3
CKMB, poc: <1 — ABNORMAL LOW
Myoglobin, poc: 58.9
Myoglobin, poc: 75.4
Operator id: 4001
Operator id: 4295
Troponin i, poc: <0.05
Troponin i, poc: <0.05

## 2011-04-14 LAB — CBC
HCT: 37.7
Hemoglobin: 13.2
MCHC: 35.1
MCV: 91.7
Platelets: 204
RBC: 4.11
RDW: 12.3
WBC: 4.5

## 2011-04-14 LAB — D-DIMER, QUANTITATIVE: D-Dimer, Quant: 0.71 — ABNORMAL HIGH

## 2011-04-14 LAB — LIPASE, BLOOD: Lipase: 43

## 2011-04-15 LAB — POCT HEMOGLOBIN-HEMACUE
Hemoglobin: 12.5
Operator id: 268271

## 2011-04-21 LAB — POCT CARDIAC MARKERS
CKMB, poc: 2
Myoglobin, poc: 64.9
Operator id: 198171
Troponin i, poc: <0.05

## 2011-04-21 LAB — CBC
HCT: 40.2
Hemoglobin: 13.7
MCHC: 34
MCV: 91.2
Platelets: 179
RBC: 4.41
RDW: 13
WBC: 4.2

## 2011-04-21 LAB — COMPREHENSIVE METABOLIC PANEL
ALT: 22
AST: 31
Albumin: 4
Alkaline Phosphatase: 53
BUN: 13
CO2: 27
Calcium: 9.5
Chloride: 108
Creatinine, Ser: 0.6
GFR calc Af Amer: >60
GFR calc non Af Amer: >60
Glucose, Bld: 92
Potassium: 3.9
Sodium: 144
Total Bilirubin: 0.9
Total Protein: 6.7

## 2011-04-21 LAB — DIFFERENTIAL
Basophils Absolute: 0
Basophils Relative: 0
Eosinophils Absolute: 0.1
Eosinophils Relative: 2
Lymphocytes Relative: 36
Lymphs Abs: 1.5
Monocytes Absolute: 0.4
Monocytes Relative: 8
Neutro Abs: 2.2
Neutrophils Relative %: 53

## 2011-04-21 LAB — CK: Total CK: 75

## 2011-04-21 LAB — D-DIMER, QUANTITATIVE: D-Dimer, Quant: 0.49 — ABNORMAL HIGH

## 2011-04-24 ENCOUNTER — Emergency Department (HOSPITAL_COMMUNITY): Payer: No Typology Code available for payment source

## 2011-04-24 ENCOUNTER — Other Ambulatory Visit: Payer: Self-pay | Admitting: *Deleted

## 2011-04-24 ENCOUNTER — Emergency Department (HOSPITAL_COMMUNITY)
Admission: EM | Admit: 2011-04-24 | Discharge: 2011-04-25 | Disposition: A | Discharge status: Home / Self Care | Payer: No Typology Code available for payment source | Attending: Emergency Medicine | Admitting: Emergency Medicine

## 2011-04-24 DIAGNOSIS — I4891 Unspecified atrial fibrillation: Secondary | ICD-10-CM | POA: Insufficient documentation

## 2011-04-24 DIAGNOSIS — I491 Atrial premature depolarization: Secondary | ICD-10-CM | POA: Insufficient documentation

## 2011-04-24 DIAGNOSIS — R921 Mammographic calcification found on diagnostic imaging of breast: Secondary | ICD-10-CM

## 2011-04-24 DIAGNOSIS — R0789 Other chest pain: Secondary | ICD-10-CM | POA: Insufficient documentation

## 2011-04-24 DIAGNOSIS — N301 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: Secondary | ICD-10-CM | POA: Insufficient documentation

## 2011-04-24 DIAGNOSIS — K219 Gastro-esophageal reflux disease without esophagitis: Secondary | ICD-10-CM | POA: Insufficient documentation

## 2011-04-24 LAB — BASIC METABOLIC PANEL
BUN: 17 mg/dL (ref 6–23)
CO2: 28 mEq/L (ref 19–32)
Calcium: 9.2 mg/dL (ref 8.4–10.5)
Chloride: 107 mEq/L (ref 96–112)
Creatinine, Ser: 0.61 mg/dL (ref 0.50–1.10)
GFR calc Af Amer: >90 mL/min (ref >90)
GFR calc non Af Amer: >90 mL/min (ref >90)
Glucose, Bld: 119 mg/dL — ABNORMAL HIGH (ref 70–99)
Potassium: 3.4 mEq/L — ABNORMAL LOW (ref 3.5–5.1)
Sodium: 142 mEq/L (ref 135–145)

## 2011-04-24 LAB — POCT I-STAT TROPONIN I
Troponin i, poc: 0 ng/mL (ref 0.00–0.08)
Troponin i, poc: 0.01 ng/mL (ref 0.00–0.08)

## 2011-04-24 LAB — CBC
HCT: 35.4 % — ABNORMAL LOW (ref 36.0–46.0)
Hemoglobin: 11.9 g/dL — ABNORMAL LOW (ref 12.0–15.0)
MCH: 30.7 pg (ref 26.0–34.0)
MCHC: 33.6 g/dL (ref 30.0–36.0)
MCV: 91.2 fL (ref 78.0–100.0)
Platelets: 152 10*3/uL (ref 150–400)
RBC: 3.88 MIL/uL (ref 3.87–5.11)
RDW: 12.3 % (ref 11.5–15.5)
WBC: 4.1 10*3/uL (ref 4.0–10.5)

## 2011-04-24 LAB — PROTIME-INR
INR: 0.93 (ref 0.00–1.49)
Prothrombin Time: 12.7 seconds (ref 11.6–15.2)

## 2011-04-24 LAB — APTT: aPTT: 33 seconds (ref 24–37)

## 2011-04-24 LAB — D-DIMER, QUANTITATIVE: D-Dimer, Quant: 0.59 ug/mL-FEU — ABNORMAL HIGH (ref 0.00–0.48)

## 2011-04-25 ENCOUNTER — Emergency Department (HOSPITAL_COMMUNITY): Payer: No Typology Code available for payment source

## 2011-04-25 MED ORDER — TECHNETIUM TO 99M ALBUMIN AGGREGATED
6.0000 | Freq: Once | INTRAVENOUS | Status: AC | PRN
  Administered 2011-04-25: 6 via INTRAVENOUS

## 2011-04-25 MED ORDER — XENON XE 133 GAS
10.0000 | GAS_FOR_INHALATION | Freq: Once | RESPIRATORY_TRACT | Status: AC | PRN
  Administered 2011-04-25: 10 via RESPIRATORY_TRACT

## 2011-04-29 ENCOUNTER — Other Ambulatory Visit: Payer: Self-pay | Admitting: *Deleted

## 2011-04-29 ENCOUNTER — Other Ambulatory Visit: Payer: Self-pay | Admitting: Radiology

## 2011-04-29 DIAGNOSIS — R920 Mammographic microcalcification found on diagnostic imaging of breast: Secondary | ICD-10-CM

## 2011-05-01 ENCOUNTER — Other Ambulatory Visit: Payer: Self-pay | Admitting: Gynecology

## 2011-05-01 DIAGNOSIS — R920 Mammographic microcalcification found on diagnostic imaging of breast: Secondary | ICD-10-CM

## 2011-05-01 DIAGNOSIS — R921 Mammographic calcification found on diagnostic imaging of breast: Secondary | ICD-10-CM

## 2011-05-14 ENCOUNTER — Other Ambulatory Visit: Payer: Self-pay | Admitting: *Deleted

## 2011-05-14 DIAGNOSIS — Z09 Encounter for follow-up examination after completed treatment for conditions other than malignant neoplasm: Secondary | ICD-10-CM

## 2011-10-30 ENCOUNTER — Other Ambulatory Visit: Payer: Self-pay | Admitting: Gynecology

## 2011-10-30 DIAGNOSIS — Z09 Encounter for follow-up examination after completed treatment for conditions other than malignant neoplasm: Secondary | ICD-10-CM

## 2011-11-06 ENCOUNTER — Ambulatory Visit (INDEPENDENT_AMBULATORY_CARE_PROVIDER_SITE_OTHER): Payer: Medicare Other | Admitting: Family Medicine

## 2011-11-06 ENCOUNTER — Encounter: Payer: Self-pay | Admitting: Family Medicine

## 2011-11-06 VITALS — BP 110/80 | HR 71 | Temp 98.4°F | Ht 66.5 in | Wt 127.8 lb

## 2011-11-06 DIAGNOSIS — G629 Polyneuropathy, unspecified: Secondary | ICD-10-CM

## 2011-11-06 DIAGNOSIS — J309 Allergic rhinitis, unspecified: Secondary | ICD-10-CM

## 2011-11-06 DIAGNOSIS — C439 Malignant melanoma of skin, unspecified: Secondary | ICD-10-CM | POA: Insufficient documentation

## 2011-11-06 DIAGNOSIS — G4762 Sleep related leg cramps: Secondary | ICD-10-CM

## 2011-11-06 DIAGNOSIS — G609 Hereditary and idiopathic neuropathy, unspecified: Secondary | ICD-10-CM

## 2011-11-06 DIAGNOSIS — N301 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: Secondary | ICD-10-CM | POA: Insufficient documentation

## 2011-11-06 DIAGNOSIS — Z8679 Personal history of other diseases of the circulatory system: Secondary | ICD-10-CM

## 2011-11-06 DIAGNOSIS — K219 Gastro-esophageal reflux disease without esophagitis: Secondary | ICD-10-CM

## 2011-11-06 DIAGNOSIS — J302 Other seasonal allergic rhinitis: Secondary | ICD-10-CM

## 2011-11-06 DIAGNOSIS — Z136 Encounter for screening for cardiovascular disorders: Secondary | ICD-10-CM

## 2011-11-06 DIAGNOSIS — Z981 Arthrodesis status: Secondary | ICD-10-CM | POA: Insufficient documentation

## 2011-11-06 HISTORY — DX: 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: N30.10

## 2011-11-06 HISTORY — DX: Polyneuropathy, unspecified: G62.9

## 2011-11-06 LAB — LIPID PANEL
Cholesterol: 224 mg/dL — ABNORMAL HIGH (ref 0–200)
HDL: 98.9 mg/dL (ref >39.00)
Total CHOL/HDL Ratio: 2
Triglycerides: 75 mg/dL (ref 0.0–149.0)
VLDL: 15 mg/dL (ref 0.0–40.0)

## 2011-11-06 LAB — BASIC METABOLIC PANEL
BUN: 19 mg/dL (ref 6–23)
CO2: 28 mEq/L (ref 19–32)
Calcium: 9.4 mg/dL (ref 8.4–10.5)
Chloride: 107 mEq/L (ref 96–112)
Creatinine, Ser: 0.6 mg/dL (ref 0.4–1.2)
GFR: 105.9 mL/min (ref >60.00)
Glucose, Bld: 86 mg/dL (ref 70–99)
Potassium: 3.9 mEq/L (ref 3.5–5.1)
Sodium: 143 mEq/L (ref 135–145)

## 2011-11-06 LAB — CBC WITH DIFFERENTIAL/PLATELET
Basophils Absolute: 0 10*3/uL (ref 0.0–0.1)
Basophils Relative: 0.5 % (ref 0.0–3.0)
Eosinophils Absolute: 0.1 10*3/uL (ref 0.0–0.7)
Eosinophils Relative: 1.1 % (ref 0.0–5.0)
HCT: 38.8 % (ref 36.0–46.0)
Hemoglobin: 13.2 g/dL (ref 12.0–15.0)
Lymphocytes Relative: 23.8 % (ref 12.0–46.0)
Lymphs Abs: 1.3 10*3/uL (ref 0.7–4.0)
MCHC: 34.1 g/dL (ref 30.0–36.0)
MCV: 93.4 fl (ref 78.0–100.0)
Monocytes Absolute: 0.5 10*3/uL (ref 0.1–1.0)
Monocytes Relative: 8.6 % (ref 3.0–12.0)
Neutro Abs: 3.6 10*3/uL (ref 1.4–7.7)
Neutrophils Relative %: 66 % (ref 43.0–77.0)
Platelets: 189 10*3/uL (ref 150.0–400.0)
RBC: 4.16 Mil/uL (ref 3.87–5.11)
RDW: 14.2 % (ref 11.5–14.6)
WBC: 5.5 10*3/uL (ref 4.5–10.5)

## 2011-11-06 LAB — HEPATIC FUNCTION PANEL
ALT: 23 U/L (ref 0–35)
AST: 23 U/L (ref 0–37)
Albumin: 3.9 g/dL (ref 3.5–5.2)
Alkaline Phosphatase: 55 U/L (ref 39–117)
Bilirubin, Direct: 0.1 mg/dL (ref 0.0–0.3)
Total Bilirubin: 1 mg/dL (ref 0.3–1.2)
Total Protein: 6.4 g/dL (ref 6.0–8.3)

## 2011-11-06 LAB — LDL CHOLESTEROL, DIRECT: Direct LDL: 123.2 mg/dL

## 2011-11-06 LAB — TSH: TSH: 1.84 u[IU]/mL (ref 0.35–5.50)

## 2011-11-06 NOTE — Progress Notes (Signed)
  Subjective:    Patient ID: [name], female    DOB: 10/10/1945, 66 y.o.   MRN: [id number]  HPI New to establish.  Previous MD- Co[facility] Deboraha Sprang [facility]) GYN- Tonye PearsonDutch Quint (cervical spinal fusion)  Chronic back pain- Has had back injections which in turn causes palpitations.  Has been to pain management, chiropractor, PT. Neuro- [facility], [place] (Neuropathy)  On Neurontin, has difficulty tolerating drugs, is attempting to increase dose. Derm- Lomax (Melonoma R ankle, follows yearly) Uro- Evans (IC, kidney stones)  Previous on Elmiron, no longer taking.  Follows w/ Dr Logan Bores yearly Cards- Donnie Aho (Afib x1 episode 2011, has had normal cardiac w/u and no recurrent sxs, on low dose ASA due to intolerance of 2 different meds) GI- Medoff (GERD, NSAID induced ulcer, previously on Nexium, now on Tagamet)  Seasonal allergies- started nasonex and claritin  Nocturnal leg cramps- severe, started 6 weeks ago.  Affecting calves, toes bilaterally.  Waking her 6-8x/night.   Review of Systems For ROS see HPI     Objective:   Physical Exam  Vitals reviewed. Constitutional: She is oriented to person, place, and time. She appears well-developed and well-nourished. No distress.  HENT:  Head: Normocephalic and atraumatic.  Eyes: Conjunctivae and EOM are normal. Pupils are equal, round, and reactive to light.  Neck: Normal range of motion. Neck supple. No thyromegaly present.  Cardiovascular: Normal rate, regular rhythm, normal heart sounds and intact distal pulses.   No murmur heard. Pulmonary/Chest: Effort normal and breath sounds normal. No respiratory distress.  Abdominal: Soft. She exhibits no distension. There is no tenderness.  Musculoskeletal: She exhibits no edema.  Lymphadenopathy:    She has no cervical adenopathy.  Neurological: She is alert and oriented to person, place, and time.  Skin: Skin is warm and dry.  Psychiatric: She has a normal mood and affect. Her behavior is  normal.          Assessment & Plan:

## 2011-11-06 NOTE — Patient Instructions (Signed)
Schedule your complete physical at your convenience- you can eat prior to this Increase your water intake for the leg cramps.  Also consider increasing potassium You look great!  Keep up the good work! Any questions or concerns, call me! Welcome!  We're glad to have you!!!

## 2011-11-07 ENCOUNTER — Encounter: Payer: Self-pay | Admitting: *Deleted

## 2011-11-11 NOTE — Assessment & Plan Note (Signed)
New.  Increase fluid intake.  Check K+.  Will follow.

## 2011-11-11 NOTE — Assessment & Plan Note (Signed)
New to provider.  Following w/ Neurosurg.  Pain meds prn.

## 2011-11-11 NOTE — Assessment & Plan Note (Signed)
New to provider.  1 episode.  Follows w/ Dr Donnie Aho.  Maintained on ASA.  No recurrence.

## 2011-11-11 NOTE — Assessment & Plan Note (Signed)
New to provider.  Getting regular skin checks w/ Dr Nicholas Lose.

## 2011-11-11 NOTE — Assessment & Plan Note (Signed)
New to provider.  Following regularly w/ urology.  Controlling w/ IC diet

## 2011-11-11 NOTE — Assessment & Plan Note (Signed)
New to provider.  Following w/ Dr Kinnie Scales.  Avoiding NSAIDs, watching diet.  Controlled w/ PPI.

## 2011-11-11 NOTE — Assessment & Plan Note (Signed)
New to provider.  Following w/ neuro.  On neurontin.  Will assist as able.

## 2011-11-11 NOTE — Assessment & Plan Note (Signed)
New to provider, chronic for pt.  Continue nasal steroid and OTC antihistamine.

## 2011-11-17 ENCOUNTER — Ambulatory Visit (INDEPENDENT_AMBULATORY_CARE_PROVIDER_SITE_OTHER): Payer: Medicare Other | Admitting: Family Medicine

## 2011-11-17 ENCOUNTER — Encounter: Payer: Self-pay | Admitting: Family Medicine

## 2011-11-17 DIAGNOSIS — M79604 Pain in right leg: Secondary | ICD-10-CM

## 2011-11-17 DIAGNOSIS — Z0000 Encounter for general adult medical examination without abnormal findings: Secondary | ICD-10-CM

## 2011-11-17 DIAGNOSIS — M79605 Pain in left leg: Secondary | ICD-10-CM | POA: Insufficient documentation

## 2011-11-17 DIAGNOSIS — M79609 Pain in unspecified limb: Secondary | ICD-10-CM

## 2011-11-17 MED ORDER — CLONAZEPAM 0.5 MG PO TABS: 0.5000 mg | ORAL_TABLET | Freq: Two times a day (BID) | ORAL | Status: DC | PRN

## 2011-11-17 NOTE — Progress Notes (Signed)
  Subjective:    Patient ID: [name], female    DOB: 1945-11-06, 66 y.o.   MRN: [id number]  HPI Here today for CPE.  Risk Factors: Leg pain- hx of similar, occurs intermittently, neuro told her that this was likely due to bad back.  Took 1/2 hydrocodone w/ some relief.  Pain is bilateral, entire leg.  Pain is described as an 'aching pain' Physical Activity: walking regularly Fall Risk: low risk, very steady on feet Depression: no current sxs Hearing: normal to conversational and whispered voice ADL's: independent Cognitive: normal linear thought process, memory and attention intact Home Safety: safe at home Height, Weight, BMI, Visual Acuity: see vitals, vision corrected to 20/20 w/ glasses Counseling: UTD on GYN, GI Labs Ordered: See A&P Care Plan: See A&P    Review of Systems Patient reports no vision/ hearing changes, adenopathy,fever, weight change,  persistant/recurrent hoarseness , swallowing issues, chest pain, palpitations, edema, persistant/recurrent cough, hemoptysis, dyspnea (rest/exertional/paroxysmal nocturnal), gastrointestinal bleeding (melena, rectal bleeding), abdominal pain, significant heartburn, bowel changes, GU symptoms (dysuria, hematuria, incontinence), Gyn symptoms (abnormal  bleeding, pain),  syncope, focal weakness, memory loss, skin/hair/nail changes, abnormal bruising or bleeding, anxiety, or depression.     Objective:   Physical Exam General Appearance:    Alert, cooperative, no distress, appears stated age  Head:    Normocephalic, without obvious abnormality, atraumatic  Eyes:    PERRL, conjunctiva/corneas clear, EOM's intact, fundi    benign, both eyes  Ears:    Normal TM's and external ear canals, both ears  Nose:   Nares normal, septum midline, mucosa normal, no drainage    or sinus tenderness  Throat:   Lips, mucosa, and tongue normal; teeth and gums normal  Neck:   Supple, symmetrical, trachea midline, no adenopathy;    Thyroid: no  enlargement/tenderness/nodules  Back:     Symmetric, no curvature, ROM normal, no CVA tenderness  Lungs:     Clear to auscultation bilaterally, respirations unlabored  Chest Wall:    No tenderness or deformity   Heart:    Regular rate and rhythm, S1 and S2 normal, no murmur, rub   or gallop  Breast Exam:    Deferred to GYN  Abdomen:     Soft, non-tender, bowel sounds active all four quadrants,    no masses, no organomegaly  Genitalia:    Deferred to GYN  Rectal:    Extremities:   Extremities normal, atraumatic, no cyanosis or edema  Pulses:   2+ and symmetric all extremities  Skin:   Skin color, texture, turgor normal, no rashes or lesions  Lymph nodes:   Cervical, supraclavicular, and axillary nodes normal  Neurologic:   CNII-XII intact, normal strength, sensation and reflexes    throughout         Assessment & Plan:

## 2011-11-17 NOTE — Patient Instructions (Signed)
Follow up in 1 year or as needed Continue the hydrocodone as needed for your back pain We'll call you with your ortho appt Keep up the good work- you look great! Call with any questions or concerns Hang in there!!!

## 2011-11-18 ENCOUNTER — Telehealth: Payer: Self-pay | Admitting: Family Medicine

## 2011-11-18 NOTE — Telephone Encounter (Signed)
Please correct in pt's chart

## 2011-11-18 NOTE — Telephone Encounter (Signed)
Caller: [name]/Patient; PCP: Sheliah Hatch.; CB#: [phone]; Call regarding Wants To Correct Information On EMR; She reports not all her meds are on the EMR:  She no longer takes Elmiron or Peridex; she does take Vitamin B complex and Calcium OTC meds.  Allergies should include  X-ray contrast/dye (states worst allergy), Latex and Septra/Sulfa, Macrodantin can be tolerated. The others are correct.  Information noted and sent to office for follow up per PCP Calls, No Triage  protocol.

## 2011-11-19 NOTE — Telephone Encounter (Signed)
Pt returned call and clarified to remove Macrodantin from her allergy list, repeated updated list to pt and she stated "that is a correct list" Updated dosage amount for Calcium, pt understood that the list will update in 24-48 hours, pt understood, placed changes in chart

## 2011-11-19 NOTE — Telephone Encounter (Signed)
.  left message to have patient return my call to clarify information

## 2011-11-22 NOTE — Assessment & Plan Note (Signed)
New.  Pt has hx of 'bad back'.  Since sxs are bilateral, it is likely originating in the spine.  Refer to ortho.  Continue hydrocodone prn.  Pt expressed understanding and is in agreement w/ plan.

## 2011-11-22 NOTE — Assessment & Plan Note (Signed)
Pt's PE WNL.  UTD on health maintenance.  Reviewed labs from last visit.  Anticipatory guidance provided.

## 2012-02-18 ENCOUNTER — Encounter: Payer: Self-pay | Admitting: Gynecology

## 2012-02-18 ENCOUNTER — Ambulatory Visit (INDEPENDENT_AMBULATORY_CARE_PROVIDER_SITE_OTHER): Payer: Medicare Other | Admitting: Gynecology

## 2012-02-18 VITALS — BP 108/66

## 2012-02-18 DIAGNOSIS — M8580 Other specified disorders of bone density and structure, unspecified site: Secondary | ICD-10-CM

## 2012-02-18 DIAGNOSIS — Z780 Asymptomatic menopausal state: Secondary | ICD-10-CM

## 2012-02-18 DIAGNOSIS — Z01419 Encounter for gynecological examination (general) (routine) without abnormal findings: Secondary | ICD-10-CM

## 2012-02-18 DIAGNOSIS — N94819 Vulvodynia, unspecified: Secondary | ICD-10-CM

## 2012-02-18 DIAGNOSIS — N201 Calculus of ureter: Secondary | ICD-10-CM | POA: Insufficient documentation

## 2012-02-18 DIAGNOSIS — F419 Anxiety disorder, unspecified: Secondary | ICD-10-CM | POA: Insufficient documentation

## 2012-02-18 DIAGNOSIS — B009 Herpesviral infection, unspecified: Secondary | ICD-10-CM | POA: Insufficient documentation

## 2012-02-18 HISTORY — DX: Vulvodynia, unspecified: N94.819

## 2012-02-18 HISTORY — DX: Other specified disorders of bone density and structure, unspecified site: M85.80

## 2012-02-18 NOTE — Progress Notes (Signed)
[name] 02/21/1946 [id number]   History:    66 y.o.  for annual gyn exam with no complaints today. Patient does have a history of osteopenia and normal Frax indices 2 years ago. She is only taking vitamin D 1000 units daily and not taking any calcium yet. 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 She states it has been over 20 years since she had an abnormal Pap smear. Over 20 years ago she had cryotherapy of her cervix. 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 Neurontin 100 mg daily has helped with the ball a D&E as well. Her last colonoscopy was normal in 2010. She has history of interstitial cystitis has been followed by Dr. Eudelia Bunch at [facility] which she sees yearly. She also sees Dr. Nicholas Lose dermatologist twice a year due to the fact the patient's had history in the past of melanoma. Patient's last mammogram October 2012 she had stereotactic biopsy of the left breast with the following result:   HYALINIZED FIBROADENOMA WITH CALCIFICATIONS. - THERE IS NO EVIDENCE OF MALIGNANCY.  Past medical history,surgical history, family history and social history were all reviewed and documented in the EPIC chart.  Gynecologic History No LMP recorded. Patient has had a hysterectomy. Contraception: none Last Pap: 2012. Results were: normal Last mammogram: 2012. Results were: See above  Obstetric History OB History    Grav Para Term Preterm Abortions TAB SAB Ect Mult Living   1 1 1       1      # Outc Date GA Lbr Len/2nd Wgt Sex Del Anes PTL Lv   1 TRM     M SVD  No Yes       ROS: A ROS was performed and pertinent positives and negatives are included in the history.  GENERAL: No fevers or chills. HEENT: No change in vision, no earache, sore throat or sinus congestion. NECK: No pain or stiffness. CARDIOVASCULAR: No chest pain or pressure. No palpitations. PULMONARY: No shortness of breath, cough or wheeze. GASTROINTESTINAL: No  abdominal pain, nausea, vomiting or diarrhea, melena or bright red blood per rectum. GENITOURINARY: No urinary frequency, urgency, hesitancy or dysuria. MUSCULOSKELETAL: No joint or muscle pain, no back pain, no recent trauma. DERMATOLOGIC: No rash, no itching, no lesions. ENDOCRINE: No polyuria, polydipsia, no heat or cold intolerance. No recent change in weight. HEMATOLOGICAL: No anemia or easy bruising or bleeding. NEUROLOGIC: No headache, seizures, numbness, tingling or weakness. PSYCHIATRIC: No depression, no loss of interest in normal activity or change in sleep pattern.     Exam: chaperone present  BP 108/66  There is no height or weight on file to calculate BMI.  General appearance : Well developed well nourished female. No acute distress HEENT: Neck supple, trachea midline, no carotid bruits, no thyroidmegaly Lungs: Clear to auscultation, no rhonchi or wheezes, or rib retractions  Heart: Regular rate and rhythm, no murmurs or gallops Breast:Examined in sitting and supine position were symmetrical in appearance, no palpable masses or tenderness,  no skin retraction, no nipple inversion, no nipple discharge, no skin discoloration, no axillary or supraclavicular lymphadenopathy Abdomen: no palpable masses or tenderness, no rebound or guarding Extremities: no edema or skin discoloration or tenderness  Pelvic:  Bartholin, Urethra, Skene Glands: Within normal limits             Vagina: No gross lesions or discharge, atrophy  Cervix: Absent  Uterus  absent Adnexa  Without masses or tenderness  Anus and perineum  normal   Rectovaginal  normal sphincter tone without palpated masses or tenderness             Hemoccult card provided     Assessment/Plan:  66 y.o. 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 New Pap smear screening guidelines discussed. Since his been over 20 years since patient had any abnormal Pap smear and has had a hysterectomy she will  no longer need them. She will schedule a bone density study. I've asked her to start taking Caltrate plus once a day along with one vitamin D 3 cholecalciferol 1000 units daily. She is exercising regularly now. Hemoccult cards provided her to submit to the office for testing. Patient to schedule her next mammogram for October of this year.   Ok Edwards MD, 4:26 PM 02/18/2012

## 2012-02-18 NOTE — Patient Instructions (Addendum)

## 2012-02-20 ENCOUNTER — Encounter: Payer: Medicare Other | Admitting: Gynecology

## 2012-02-25 ENCOUNTER — Encounter: Payer: Self-pay | Admitting: Gynecology

## 2012-03-09 ENCOUNTER — Telehealth: Payer: Self-pay | Admitting: Family Medicine

## 2012-03-09 NOTE — Telephone Encounter (Signed)
Patient would like to be seen for Anxiety this week if possible, we are no longer allowed to use same day appts per mgmt., please advise where you would like her on your schedule Thanks

## 2012-03-09 NOTE — Telephone Encounter (Signed)
Advised pt of appt time on VM

## 2012-03-09 NOTE — Telephone Encounter (Signed)
11:30 tomorrow

## 2012-03-10 ENCOUNTER — Encounter: Payer: Self-pay | Admitting: Family Medicine

## 2012-03-10 ENCOUNTER — Ambulatory Visit (INDEPENDENT_AMBULATORY_CARE_PROVIDER_SITE_OTHER): Payer: Medicare Other | Admitting: Family Medicine

## 2012-03-10 VITALS — BP 112/77 | HR 92 | Temp 98.0°F | Ht 66.5 in | Wt 125.9 lb

## 2012-03-10 DIAGNOSIS — F411 Generalized anxiety disorder: Secondary | ICD-10-CM

## 2012-03-10 DIAGNOSIS — F419 Anxiety disorder, unspecified: Secondary | ICD-10-CM

## 2012-03-10 MED ORDER — CITALOPRAM HYDROBROMIDE 10 MG PO TABS: 10.0000 mg | ORAL_TABLET | Freq: Every day | ORAL | Status: DC

## 2012-03-10 NOTE — Patient Instructions (Addendum)
Follow up in 1 month to recheck mood Start the Celexa daily to take the edge off If you feel that it makes you sleepy- which it shouldn't- try taking it at night Call with any questions or concerns Hang in there!  You have a lot going on!!!

## 2012-03-10 NOTE — Assessment & Plan Note (Signed)
Deteriorated due to recent life stressors.  Start low dose SSRI as pt has difficulty w/ med side effects.  Reassured her that she can continue Klonopin prn.  Reviewed supportive care and red flags that should prompt return.  Pt expressed understanding and is in agreement w/ plan.

## 2012-03-10 NOTE — Progress Notes (Signed)
  Subjective:    Patient ID: [name], female    DOB: September 29, 1945, 66 y.o.   MRN: [id number]  HPI Anxiety- pt reports she's 'always been a worrier'.  Lately has been under a lot of stress w/ son's issues.  Reports that she is having GERD that wakes her up at night and 'i think i'm having a heart attack'.  i saw Dr Donnie Aho and he started her on Pepcid 1 week ago and Metoprolol for rapid heart rate.  Too early to tell if sxs are improving.  Is aware that she needs to sell her house next year but isn't sure where she's going to go.  Has noticed some increased hair loss.  More anxious than usual.  Reports poor sleep- waking 6-8 times/night due to IC.  Has previously been on Prozac for '2 week PMS cycle' that occurs monthly.  Side effect of med included increased irritability, fatigue, 'zombie like' effect.   Review of Systems For ROS see HPI     Objective:   Physical Exam  Vitals reviewed. Constitutional: She is oriented to person, place, and time. She appears well-developed and well-nourished. No distress.  Neurological: She is alert and oriented to person, place, and time.  Skin: Skin is warm and dry.  Psychiatric: She has a normal mood and affect. Her behavior is normal. Thought content normal.          Assessment & Plan:

## 2012-03-11 ENCOUNTER — Ambulatory Visit: Payer: Medicare Other | Admitting: Family Medicine

## 2012-03-24 ENCOUNTER — Telehealth: Payer: Self-pay | Admitting: Family Medicine

## 2012-03-24 NOTE — Telephone Encounter (Signed)
Lm on triage line 342pm  Has been seeing dermatology due to hair loss, pt wants to know if labs from CPE included Thyroid, Iron, testosterone. CB# [phone]

## 2012-03-25 NOTE — Telephone Encounter (Signed)
Can switch to Zoloft 50mg .  Take 1/2 tab x2 weeks and then whole tab. Derm can order labs to assess her hair loss- in men, it's typically high testosterone that causes hair loss

## 2012-03-25 NOTE — Telephone Encounter (Signed)
Advised that the testosterone was NOT drawn, the TSH was normal as well as the iron, wants to ask if a low testosterone in women can cause hairloss? Wanted to inform MD Tabori that the celexa makes her aggravated and irritated and she stopped taking a week ago, but also notes a lot of stress currently

## 2012-03-25 NOTE — Telephone Encounter (Signed)
Spoke to pt to advise results/instructions. Pt understood. Pt declined medication, noted no resolve with paxil, prozac, nor xanax, mailed pt list of pysch information per MD Tabori verbal instructions, pt understood and clarified address, she will advise derm about labs

## 2012-04-06 ENCOUNTER — Other Ambulatory Visit: Payer: Self-pay | Admitting: *Deleted

## 2012-04-06 DIAGNOSIS — R921 Mammographic calcification found on diagnostic imaging of breast: Secondary | ICD-10-CM

## 2012-04-09 ENCOUNTER — Ambulatory Visit: Payer: Medicare Other | Admitting: Family Medicine

## 2012-04-12 ENCOUNTER — Ambulatory Visit (INDEPENDENT_AMBULATORY_CARE_PROVIDER_SITE_OTHER): Payer: Medicare Other | Admitting: Family Medicine

## 2012-04-12 ENCOUNTER — Encounter: Payer: Self-pay | Admitting: Family Medicine

## 2012-04-12 VITALS — BP 121/81 | HR 77 | Temp 97.7°F | Ht 66.75 in | Wt 127.2 lb

## 2012-04-12 DIAGNOSIS — F419 Anxiety disorder, unspecified: Secondary | ICD-10-CM

## 2012-04-12 DIAGNOSIS — L659 Nonscarring hair loss, unspecified: Secondary | ICD-10-CM | POA: Insufficient documentation

## 2012-04-12 DIAGNOSIS — F411 Generalized anxiety disorder: Secondary | ICD-10-CM

## 2012-04-12 HISTORY — DX: Nonscarring hair loss, unspecified: L65.9

## 2012-04-12 NOTE — Assessment & Plan Note (Signed)
Unchanged.  Pt unable to tolerate Celexa and was unwilling to try other SSRIs due to previous side effects/intolerances.  Pt is considering counseling- I encouraged this.  Names and #s provided.  Will follow.

## 2012-04-12 NOTE — Assessment & Plan Note (Signed)
New to provider.  Most likely due to physical and emotional stress but pt seems to feel this is related to her valtrex.  Discussed that she doesn't need daily valtrex and that if she has an outbreak we can tx prn.  Will follow.

## 2012-04-12 NOTE — Patient Instructions (Addendum)
I think you look great! The hairloss is NOT noticeable and should improve STOP the Valtrex Consider counseling for the anxiety- I think it will help Hang in there!!

## 2012-04-12 NOTE — Progress Notes (Signed)
  Subjective:    Patient ID: [name], female    DOB: 09-Aug-1945, 66 y.o.   MRN: [id number]  HPI Anxiety- pt was unable to tolerate Celexa, declined switching to Zoloft.  Has been very emotional about her hair loss, recent levels of pain.  Has previously done counseling w/ some results- has not called to set up appt.  Hair loss- pt fears that her meds are causing this, stopped Valtrex recently b/c she feels this is the cause.  Has derm ([facility]).  Still having some hair loss w/ combing but feels this has slowed from previous.  Was under a lot of physical stress- kidney stone, IC flare, spinal fusion difficulty.   Review of Systems For ROS see HPI     Objective:   Physical Exam  Vitals reviewed. Constitutional: She is oriented to person, place, and time. She appears well-developed and well-nourished. No distress.  HENT:  Head: Normocephalic and atraumatic.       No obvious hair loss noted  Neurological: She is alert and oriented to person, place, and time.  Skin: Skin is warm and dry. No rash noted.  Psychiatric: Her behavior is normal. Judgment and thought content normal.       Mildly anxious          Assessment & Plan:

## 2012-04-15 ENCOUNTER — Other Ambulatory Visit: Payer: Self-pay | Admitting: Neurosurgery

## 2012-04-15 DIAGNOSIS — M792 Neuralgia and neuritis, unspecified: Secondary | ICD-10-CM

## 2012-04-20 ENCOUNTER — Ambulatory Visit (INDEPENDENT_AMBULATORY_CARE_PROVIDER_SITE_OTHER): Payer: Medicare Other

## 2012-04-20 DIAGNOSIS — M949 Disorder of cartilage, unspecified: Secondary | ICD-10-CM

## 2012-04-20 DIAGNOSIS — M8580 Other specified disorders of bone density and structure, unspecified site: Secondary | ICD-10-CM

## 2012-04-20 DIAGNOSIS — Z780 Asymptomatic menopausal state: Secondary | ICD-10-CM

## 2012-04-20 DIAGNOSIS — M899 Disorder of bone, unspecified: Secondary | ICD-10-CM

## 2012-04-21 ENCOUNTER — Ambulatory Visit
Admission: RE | Admit: 2012-04-21 | Discharge: 2012-04-21 | Disposition: A | Discharge status: Home / Self Care | Payer: Medicare Other | Source: Ambulatory Visit | Attending: Neurosurgery | Admitting: Neurosurgery

## 2012-04-21 DIAGNOSIS — M792 Neuralgia and neuritis, unspecified: Secondary | ICD-10-CM

## 2012-04-22 ENCOUNTER — Other Ambulatory Visit: Payer: Self-pay | Admitting: *Deleted

## 2012-04-22 ENCOUNTER — Telehealth: Payer: Self-pay | Admitting: *Deleted

## 2012-04-22 DIAGNOSIS — M949 Disorder of cartilage, unspecified: Secondary | ICD-10-CM

## 2012-04-22 DIAGNOSIS — M899 Disorder of bone, unspecified: Secondary | ICD-10-CM

## 2012-04-22 NOTE — Telephone Encounter (Signed)
Pt had questions regarding recent bone density results. All questions answered.

## 2012-04-26 ENCOUNTER — Other Ambulatory Visit: Payer: Self-pay | Admitting: Anesthesiology

## 2012-04-26 ENCOUNTER — Other Ambulatory Visit: Payer: Self-pay | Admitting: Gynecology

## 2012-04-26 DIAGNOSIS — Z1211 Encounter for screening for malignant neoplasm of colon: Secondary | ICD-10-CM

## 2012-04-28 ENCOUNTER — Other Ambulatory Visit: Payer: Self-pay | Admitting: Gynecology

## 2012-04-28 DIAGNOSIS — R921 Mammographic calcification found on diagnostic imaging of breast: Secondary | ICD-10-CM

## 2012-05-12 ENCOUNTER — Ambulatory Visit: Payer: Medicare Other | Admitting: Family Medicine

## 2012-05-13 ENCOUNTER — Telehealth: Payer: Self-pay | Admitting: Family Medicine

## 2012-05-13 NOTE — Telephone Encounter (Signed)
Noted  

## 2012-05-13 NOTE — Telephone Encounter (Signed)
Caller: Hargun/Patient; Patient Name: [name]; PCP: Sheliah Hatch.; Best Callback Phone Number: [phone]; Reason for call: Other  Jhanae states she had called earlier to to cancel appointment. States she has " a runned down feeling", upset stomach, Low back pain. No flu like symptoms. States she has these symptoms when she has a kidney stone. Has put in a call to Urologist and wants to speak with them first. Will call back if unable to see Urologist.

## 2012-05-14 ENCOUNTER — Ambulatory Visit: Payer: Medicare Other | Admitting: Family Medicine

## 2012-05-14 ENCOUNTER — Ambulatory Visit (INDEPENDENT_AMBULATORY_CARE_PROVIDER_SITE_OTHER): Payer: Medicare Other | Admitting: Family Medicine

## 2012-05-14 VITALS — BP 124/70 | HR 80 | Temp 98.1°F | Resp 16 | Ht 67.0 in | Wt 129.4 lb

## 2012-05-14 DIAGNOSIS — H6120 Impacted cerumen, unspecified ear: Secondary | ICD-10-CM

## 2012-05-14 DIAGNOSIS — R42 Dizziness and giddiness: Secondary | ICD-10-CM

## 2012-05-14 DIAGNOSIS — R110 Nausea: Secondary | ICD-10-CM

## 2012-05-14 DIAGNOSIS — N390 Urinary tract infection, site not specified: Secondary | ICD-10-CM

## 2012-05-14 LAB — POCT CBC
Granulocyte percent: 57.6 %G (ref 37–80)
HCT, POC: 43.9 % (ref 37.7–47.9)
Hemoglobin: 13.6 g/dL (ref 12.2–16.2)
Lymph, poc: 2.2 (ref 0.6–3.4)
MCH, POC: 30.6 pg (ref 27–31.2)
MCHC: 31 g/dL — AB (ref 31.8–35.4)
MCV: 98.8 fL — AB (ref 80–97)
MID (cbc): 0.4 (ref 0–0.9)
MPV: 8.2 fL (ref 0–99.8)
POC Granulocyte: 3.5 (ref 2–6.9)
POC LYMPH PERCENT: 36.4 %L (ref 10–50)
POC MID %: 6 %M (ref 0–12)
Platelet Count, POC: 224 10*3/uL (ref 142–424)
RBC: 4.44 M/uL (ref 4.04–5.48)
RDW, POC: 12.1 %
WBC: 6 10*3/uL (ref 4.6–10.2)

## 2012-05-14 LAB — POCT URINALYSIS DIPSTICK
Bilirubin, UA: NEGATIVE
Blood, UA: NEGATIVE
Glucose, UA: NEGATIVE
Ketones, UA: NEGATIVE
Nitrite, UA: NEGATIVE
Protein, UA: NEGATIVE
Spec Grav, UA: 1.02
Urobilinogen, UA: 0.2
pH, UA: 5.5

## 2012-05-14 LAB — POCT UA - MICROSCOPIC ONLY
Bacteria, U Microscopic: NEGATIVE
Crystals, Ur, HPF, POC: NEGATIVE
Epithelial cells, urine per micros: NEGATIVE
Mucus, UA: NEGATIVE
Yeast, UA: NEGATIVE

## 2012-05-14 MED ORDER — CIPROFLOXACIN HCL 250 MG PO TABS: 250.0000 mg | ORAL_TABLET | Freq: Two times a day (BID) | ORAL | Status: DC

## 2012-05-14 NOTE — Progress Notes (Signed)
[facility]:  Office Visit  Chief Complaint:  Chief Complaint  Patient presents with  . Abdominal Pain    upset stomach nausea fatigue some dizziness x 1 week    HPI: [name] is a 66 y.o. female who complains of  Nausea, quesy feeling in her stomach, dizziness and elevated BP was high as 155-159. She felt dizzy regardless of position, sitting, standing, laying down"Not feeling well". Has herpes. Her cat has been sick, she has been stress and she had a small outbreak of HSV and started Valtrex 500 mg a day . The sxs started around the same time she started taking the Valtrex. Still has a small area of HSV outbreak but she can't take the Valtrex anymore, stopped yesterday. Denies fevers, chills, UTI sxs, CP, SOB  Past Medical History  Diagnosis Date  . Vaginal delivery     ONE NSVD  . Vulvodynia   . PMDD (premenstrual dysphoric disorder)   . IC (intermittent claudication)   . Atrial fibrillation   . Melanoma of ankle   . Rotator cuff tear   . Melanoma   . Kidney stone   . Heart disease     noted A Fib once  . GERD (gastroesophageal reflux disease)   . Herpes    Past Surgical History  Procedure Date  . Vaginal hysterectomy     TVH  . Back surgery     DISC FUSION L5,L6,L7  . Bunionectomy   . Head & neck skin lesion excisional biopsy     melanoma  . Bladder surgery   . Rotator cuff repair 2012    RIGHT  . Breast surgery     BREAST BIOPSY--RIGHT BENIGN   History   Social History  . Marital Status: Divorced    Spouse Name: N/A    Number of Children: N/A  . Years of Education: N/A   Social History Main Topics  . Smoking status: Never Smoker   . Smokeless tobacco: Not on file  . Alcohol Use: No  . Drug Use: No  . Sexually Active: Not on file   Other Topics Concern  . Not on file   Social History Narrative  . No narrative on file   Family History  Problem Relation Age of Onset  . Diabetes Father   . Hyperlipidemia Sister   . Heart  disease Sister   . Stroke Sister   . Diabetes Brother   . Hyperlipidemia Sister   . Heart disease Sister   . Arthritis Mother   . Cancer Mother   . Heart disease Mother    Allergies  Allergen Reactions  . Erythromycin   . Latex   . Other     IV CONTRAST /"DYE".  . Septra (Bactrim)   . Sulfa Antibiotics    Prior to Admission medications   Medication Sig Start Date End Date Taking? Authorizing Provider  aspirin 81 MG tablet Take 81 mg by mouth daily.     Yes Historical Provider, MD  b complex vitamins tablet Take 1 tablet by mouth daily.   Yes Historical Provider, MD  calcium carbonate (OS-CAL) 600 MG TABS Take 1,200 mg by mouth daily.   Yes Historical Provider, MD  cholecalciferol (VITAMIN D) 1000 UNITS tablet Take 1,000 Units by mouth daily.   Yes Historical Provider, MD  clonazePAM (KLONOPIN) 0.5 MG tablet Take 1 tablet (0.5 mg total) by mouth 2 (two) times daily as needed. 11/17/11 03/19/13 Yes Sheliah Hatch, MD  famotidine (PEPCID) 20 MG tablet  03/03/12  Yes Historical Provider, MD  fluticasone (FLONASE) 50 MCG/ACT nasal spray  10/13/11  Yes Historical Provider, MD  gabapentin (NEURONTIN) 100 MG capsule TAKE ONE CAPSULE BY MOUTH EVERY DAY 03/25/11  Yes Ok Edwards, MD  HYDROcodone-acetaminophen [facility]) 5-325 MG per tablet  09/18/11  Yes Historical Provider, MD  valACYclovir (VALTREX) 500 MG tablet Take 500 mg by mouth 2 (two) times daily.   Yes Historical Provider, MD  citalopram (CELEXA) 10 MG tablet Take 1 tablet (10 mg total) by mouth daily. 03/10/12 03/10/13  Sheliah Hatch, MD  hydrocortisone 2.5 % cream  02/06/12   Historical Provider, MD  loratadine (CLARITIN) 10 MG tablet Take 10 mg by mouth daily.    Historical Provider, MD  metoprolol tartrate (LOPRESSOR) 25 MG tablet Take 25 mg by mouth as needed. Takes PRN for heart racing episodes per Cards MD 03/03/12   Historical Provider, MD  oxybutynin (DITROPAN) 5 MG tablet  04/08/12   Historical Provider, MD    triamterene-hydrochlorothiazide (MAXZIDE) 75-50 MG per tablet  04/07/12   Historical Provider, MD  Vitamin E 16109 UNITS CREA Apply topically.      Historical Provider, MD     ROS: The patient denies fevers, chills, night sweats, unintentional weight loss, chest pain, palpitations, wheezing, dyspnea on exertion, dysuria, hematuria, melena, numbness, weakness, or tingling.   All other systems have been reviewed and were otherwise negative with the exception of those mentioned in the HPI and as above.    PHYSICAL EXAM: Filed Vitals:   05/14/12 1800  BP: 124/70  Pulse: 80  Temp: 98.1 F (36.7 C)  Resp: 16   Filed Vitals:   05/14/12 1800  Height: 5\' 7"  (1.702 m)  Weight: 129 lb 6.4 oz (58.695 kg)   Body mass index is 20.27 kg/(m^2).  General: Alert, no acute distress HEENT:  Normocephalic, atraumatic, oropharynx patent. TM not visualized, full of wax Cardiovascular:  Regular rate and rhythm, no rubs murmurs or gallops.  No Carotid bruits, radial pulse intact. No pedal edema.  Respiratory: Clear to auscultation bilaterally.  No wheezes, rales, or rhonchi.  No cyanosis, no use of accessory musculature GI: No organomegaly, abdomen is soft and non-tender, positive bowel sounds.  No masses. Skin: No rashes. Neurologic: Facial musculature symmetric. Psychiatric: Patient is appropriate throughout our interaction. Lymphatic: No cervical lymphadenopathy Musculoskeletal: Gait intact.   LABS: Results for orders placed in visit on 05/14/12  POCT CBC      Component Value Range   WBC 6.0  4.6 - 10.2 K/uL   Lymph, poc 2.2  0.6 - 3.4   POC LYMPH PERCENT 36.4  10 - 50 %L   MID (cbc) 0.4  0 - 0.9   POC MID % 6.0  0 - 12 %M   POC Granulocyte 3.5  2 - 6.9   Granulocyte percent 57.6  37 - 80 %G   RBC 4.44  4.04 - 5.48 M/uL   Hemoglobin 13.6  12.2 - 16.2 g/dL   HCT, POC 60.4  54.0 - 47.9 %   MCV 98.8 (*) 80 - 97 fL   MCH, POC 30.6  27 - 31.2 pg   MCHC 31.0 (*) 31.8 - 35.4 g/dL   RDW, POC  98.1     Platelet Count, POC 224  142 - 424 K/uL   MPV 8.2  0 - 99.8 fL  POCT UA - MICROSCOPIC ONLY      Component Value Range  WBC, Ur, HPF, POC 0-2     RBC, urine, microscopic 0-1     Bacteria, U Microscopic neg     Mucus, UA neg     Epithelial cells, urine per micros neg     Crystals, Ur, HPF, POC neg     Casts, Ur, LPF, POC renal tubular     Yeast, UA neg    POCT URINALYSIS DIPSTICK      Component Value Range   Color, UA yellow     Clarity, UA clear     Glucose, UA neg     Bilirubin, UA neg     Ketones, UA neg     Spec Grav, UA 1.020     Blood, UA neg     pH, UA 5.5     Protein, UA neg     Urobilinogen, UA 0.2     Nitrite, UA neg     Leukocytes, UA Trace       EKG/XRAY:   Primary read interpreted by Dr. Conley Rolls at [facility].   ASSESSMENT/PLAN: Encounter Diagnoses  Name Primary?  . Dizziness Yes  . Cerumen impaction   . Nausea   . UTI (lower urinary tract infection)    Rx Cipro 250 mg BID x 3 days Cerumen disimpaction Urine cx pending F/u prn    Yezenia Fredrick PHUONG, DO 05/15/2012 10:06 AM

## 2012-05-16 LAB — URINE CULTURE
Colony Count: NO GROWTH
Organism ID, Bacteria: NO GROWTH

## 2012-05-17 ENCOUNTER — Telehealth: Payer: Self-pay

## 2012-05-17 NOTE — Telephone Encounter (Signed)
Message copied by Johnnette Litter on Mon May 17, 2012  5:38 PM ------      Message from: LE, [place] P      Created: Mon May 17, 2012  1:15 PM      Regarding: Labs       Patient urine cx was negative. Continue with medication. I hope she feels better. It is most likely the Valtrex then. If she has same or  problems then she can call me or follow-up.

## 2012-05-17 NOTE — Telephone Encounter (Signed)
Pt notified. Pt still a little dizzy but not as bad, but still not feeling right. Advised to RTC. Also still having urinary sxs. Is going to see the Urologist on Wed and will RTC here after to see Dr. Conley Rolls

## 2012-05-19 ENCOUNTER — Ambulatory Visit (INDEPENDENT_AMBULATORY_CARE_PROVIDER_SITE_OTHER): Payer: Medicare Other | Admitting: Family Medicine

## 2012-05-19 ENCOUNTER — Encounter: Payer: Self-pay | Admitting: Family Medicine

## 2012-05-19 ENCOUNTER — Ambulatory Visit: Payer: Medicare Other | Admitting: Family Medicine

## 2012-05-19 VITALS — BP 98/52 | HR 84 | Temp 96.8°F | Wt 128.0 lb

## 2012-05-19 DIAGNOSIS — M545 Low back pain, unspecified: Secondary | ICD-10-CM

## 2012-05-19 DIAGNOSIS — M549 Dorsalgia, unspecified: Secondary | ICD-10-CM

## 2012-05-19 DIAGNOSIS — J329 Chronic sinusitis, unspecified: Secondary | ICD-10-CM | POA: Insufficient documentation

## 2012-05-19 LAB — POCT URINALYSIS DIPSTICK
Bilirubin, UA: NEGATIVE
Blood, UA: NEGATIVE
Glucose, UA: NEGATIVE
Ketones, UA: NEGATIVE
Leukocytes, UA: NEGATIVE
Nitrite, UA: NEGATIVE
Protein, UA: NEGATIVE
Spec Grav, UA: <=1.005
Urobilinogen, UA: 0.2
pH, UA: 5

## 2012-05-19 MED ORDER — PREDNISONE 10 MG PO TABS: ORAL_TABLET | ORAL | Status: DC

## 2012-05-19 MED ORDER — CYCLOBENZAPRINE HCL 5 MG PO TABS: 5.0000 mg | ORAL_TABLET | Freq: Three times a day (TID) | ORAL | Status: DC | PRN

## 2012-05-19 NOTE — Progress Notes (Signed)
  Subjective:    Patient ID: [name], female    DOB: [birth year], 67 y.o.   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  Started Valtrex x10 days.  Developed dizziness- but not vertigo.  Went to [place] on 11/8 and dx'd w/ UTI.  tx'd w/ 3 days of Cipro.  Still having back pain- L sided.  Pain is worse w/ sitting and lying, improves w/ standing.  No radiation of pain- localized to lumbar spine.  Pain w/ flexion/extension.  No changes in activity level.  + nasal congestion- taking allergy med and using Nasonex prn.  Finds some relief of dizziness w/ nasal steroid use.  + HAs, facial pressure, ear fullness.  Has not felt well x3 weeks.   Review of Systems For ROS see HPI    Objective:   Physical Exam  Vitals reviewed. Constitutional: She is oriented to person, place, and time. She appears well-developed and well-nourished. No distress.  HENT:  Head: Normocephalic and atraumatic.  Right Ear: Tympanic membrane normal.  Left Ear: Tympanic membrane normal.  Nose: Mucosal edema and rhinorrhea present. Right sinus exhibits maxillary sinus tenderness (minimal). Right sinus exhibits no frontal sinus tenderness. Left sinus exhibits maxillary sinus tenderness (minimal). Left sinus exhibits no frontal sinus tenderness.  Mouth/Throat: Mucous membranes are normal. Posterior oropharyngeal erythema (w/ PND) present.  Eyes: Conjunctivae normal and EOM are normal. Pupils are equal, round, and reactive to light.  Neck: Normal range of motion. Neck supple.  Cardiovascular: Normal rate, regular rhythm and normal heart sounds.   Pulmonary/Chest: Effort normal and breath sounds normal. No respiratory distress. She has no wheezes. She has no rales.  Musculoskeletal:       + L paraspinal spasm/tenderness + L SLR at 75 degrees Good flexion + pain w/ back extension  Lymphadenopathy:    She has no cervical adenopathy.  Neurological: She is alert and oriented to  person, place, and time. She has normal reflexes. No cranial nerve deficit. Coordination normal.  Psychiatric:       anxious          Assessment & Plan:

## 2012-05-19 NOTE — Patient Instructions (Addendum)
You appear to have a lot of sinus inflammation but not current infection Start the Prednisone as directed- take w/ food DO NOT take any additional advil, aleve, ibuprofen, motrin, etc while on the Prednisone Use the flexeril (muscle relaxer) as needed for back spasm- start w/ 1/2 tab and increase as needed.  This may make you drowsy Use a heating pad for pain relief If no improvement in pain or dizziness- please call Hang in there!!!

## 2012-05-20 ENCOUNTER — Ambulatory Visit: Payer: Medicare Other | Admitting: Family Medicine

## 2012-05-20 NOTE — Assessment & Plan Note (Addendum)
New.  Pt w/ evidence of L lumbar paraspinal spasm.  No evidence of UTI or kidney stone.  Start prednisone and low dose muscle relaxer.  Heat prn.  Reviewed supportive care and red flags that should prompt return.  Pt expressed understanding and is in agreement w/ plan.

## 2012-05-20 NOTE — Assessment & Plan Note (Signed)
New.  Pt w/ evidence of inflammation but not infxn.  No abx.  Prednisone should improve inflammation.  Continue nasal spray and allergy meds.  This is likely cause of pt's malaise and mild dizziness.  Will follow.

## 2012-05-21 ENCOUNTER — Telehealth: Payer: Self-pay

## 2012-05-21 NOTE — Telephone Encounter (Signed)
Initiated and was approved for Prior authorization. Cyclobenzaprine 5 mg tab   take 1 tab po x3  Daily prn for muscle spasms.  Confirmation# [id number]

## 2012-05-28 ENCOUNTER — Telehealth: Payer: Self-pay | Admitting: Family Medicine

## 2012-05-28 MED ORDER — AMOXICILLIN 875 MG PO TABS: 875.0000 mg | ORAL_TABLET | Freq: Two times a day (BID) | ORAL | Status: DC

## 2012-05-28 NOTE — Telephone Encounter (Signed)
Rx sent, Pt aware. 

## 2012-05-28 NOTE — Telephone Encounter (Signed)
Pt states she is still sick and would like an antibiotic. She has finished the prednisone, but is still having some dizziness and congestion. [facility] uses [facility] on [address].

## 2012-05-28 NOTE — Telephone Encounter (Signed)
Ok for Amoxicillin 875mg  bid x10 days, no refills

## 2012-06-21 ENCOUNTER — Ambulatory Visit: Payer: Medicare Other | Admitting: Family Medicine

## 2012-06-22 ENCOUNTER — Encounter: Payer: Self-pay | Admitting: Lab

## 2012-06-23 ENCOUNTER — Encounter: Payer: Self-pay | Admitting: Family Medicine

## 2012-06-23 ENCOUNTER — Ambulatory Visit (INDEPENDENT_AMBULATORY_CARE_PROVIDER_SITE_OTHER): Payer: Medicare Other | Admitting: Family Medicine

## 2012-06-23 ENCOUNTER — Ambulatory Visit: Payer: Medicare Other | Admitting: Family Medicine

## 2012-06-23 VITALS — BP 106/70 | HR 78 | Temp 98.1°F | Ht 66.75 in | Wt 131.2 lb

## 2012-06-23 DIAGNOSIS — F411 Generalized anxiety disorder: Secondary | ICD-10-CM

## 2012-06-23 DIAGNOSIS — J309 Allergic rhinitis, unspecified: Secondary | ICD-10-CM

## 2012-06-23 DIAGNOSIS — F419 Anxiety disorder, unspecified: Secondary | ICD-10-CM

## 2012-06-23 DIAGNOSIS — J302 Other seasonal allergic rhinitis: Secondary | ICD-10-CM

## 2012-06-23 MED ORDER — CLONAZEPAM 0.5 MG PO TABS: 0.5000 mg | ORAL_TABLET | Freq: Two times a day (BID) | ORAL | Status: DC | PRN

## 2012-06-23 NOTE — Progress Notes (Signed)
  Subjective:    Patient ID: [name], female    DOB: 1946-01-21, 66 y.o.   MRN: [id number]  HPI Dizziness- frequent problem for pt, 'i don't feel right'.  Pt took abx and prednisone for sinus infxn/inflammation and had some improvement.  Continues to feel 'off balance'.  sxs started 8-9 days ago.  No nausea.  No vertigo.  No visual changes.  Not using flonase regularly.  Not taking claritin regularly.  No ear pain.  PMS- ongoing problem for pt, has discussed this w/ GYN.  Has tried taking prozac for the 2 weeks of sxs and 'that didn't work'.  Has used hormone gel w/out relief.  Reports 10mg  celexa that i previously prescribed was 'too much' and 'i don't like taking meds like that'.   Review of Systems For ROS see HPI     Objective:   Physical Exam  Vitals reviewed. Constitutional: She appears well-developed and well-nourished. No distress.  HENT:  Head: Normocephalic and atraumatic.  Right Ear: Tympanic membrane normal.  Left Ear: Tympanic membrane normal.  Nose: Mucosal edema and rhinorrhea present. Right sinus exhibits no maxillary sinus tenderness and no frontal sinus tenderness. Left sinus exhibits no maxillary sinus tenderness and no frontal sinus tenderness.  Mouth/Throat: Mucous membranes are normal. Posterior oropharyngeal erythema (w/ PND) present.  Eyes: Conjunctivae normal and EOM are normal. Pupils are equal, round, and reactive to light.  Neck: Normal range of motion. Neck supple.  Cardiovascular: Normal rate, regular rhythm and normal heart sounds.   Pulmonary/Chest: Effort normal and breath sounds normal. No respiratory distress. She has no wheezes. She has no rales.  Lymphadenopathy:    She has no cervical adenopathy.  Psychiatric:       anxious          Assessment & Plan:

## 2012-06-23 NOTE — Assessment & Plan Note (Signed)
Unchanged.  Discussed that pt has not given SSRIs adequate chance to work and that idea of taking SSRI for 2 weeks out of every month is not medically sound as it takes longer than that for med to build up in system and become effective.  Again suggested daily controller med to manage her 'PMS symptoms'.  Pt willing to try 1/2 dose celexa.  Will follow closely.

## 2012-06-23 NOTE — Patient Instructions (Addendum)
Start the Claritin daily, use the Flonase daily for next few weeks Drink plenty of fluids Start a 1/2 tab of Celexa daily for the PMS symptoms Call with any questions or concerns Happy Holidays!!!

## 2012-06-23 NOTE — Assessment & Plan Note (Signed)
Unchanged.  Pt continues to have rhinitis and sinus inflammation b/c she is not taking her medications regularly.  Her head congestion is contributing to her sense of being 'off balance'.  Stressed regular use of meds and if sxs still don't improve will refer to neuro for dizziness evaluation.  Pt expressed understanding and is in agreement w/ plan.

## 2012-06-25 ENCOUNTER — Telehealth: Payer: Self-pay | Admitting: Family Medicine

## 2012-06-25 NOTE — Telephone Encounter (Signed)
[facility] but pt stated she wants sent to [facility]-added to demographics--please call pt when complete so she can pick up  Also verified with patient that she uses all 3-pharmacy's listed in chart and she does price shop  Cb# [phone]

## 2012-06-25 NOTE — Telephone Encounter (Signed)
Rx was faxed to [facility], and spoke with the pt and informed her rx will be sent.//AB/CMA

## 2012-07-01 ENCOUNTER — Ambulatory Visit (INDEPENDENT_AMBULATORY_CARE_PROVIDER_SITE_OTHER): Payer: Medicare Other | Admitting: Family Medicine

## 2012-07-01 VITALS — BP 110/70 | Temp 97.7°F | Ht 67.0 in | Wt 131.0 lb

## 2012-07-01 DIAGNOSIS — R300 Dysuria: Secondary | ICD-10-CM

## 2012-07-01 LAB — POCT URINALYSIS DIPSTICK
Bilirubin, UA: NEGATIVE
Blood, UA: NEGATIVE
Glucose, UA: NEGATIVE
Ketones, UA: NEGATIVE
Leukocytes, UA: NEGATIVE
Nitrite, UA: NEGATIVE
Protein, UA: NEGATIVE
Spec Grav, UA: 1.025
Urobilinogen, UA: 0.2
pH, UA: 5.5

## 2012-07-01 NOTE — Progress Notes (Signed)
Chief Complaint  Patient presents with  . Urinary Tract Infection    HPI: Acute visit for ? UTI: -could not get in at her PCP's office -started: 3- days ago -symptoms: burning with urination, urinary frequency and urgency - some low back pain (but has low back pain), also has a head cold -denies: fevers, NVD, pelvic pain, hematuria, flank pain -hx of: vulvodynia, IC and kidney stone - she often has a hard time separating symptoms -Per [facility] similar symptoms one month ago tx with cipro and she reports this took care of it - of note culture was neg -Sees Dr. Logan Bores for her IC -sometimes has these symptoms with stress too - has been under a lot of stress -wanted to check urine today  ROS: See pertinent positives and negatives per HPI.  Past Medical History  Diagnosis Date  . Vaginal delivery     ONE NSVD  . Vulvodynia   . PMDD (premenstrual dysphoric disorder)   . IC (intermittent claudication)   . Atrial fibrillation   . Melanoma of ankle   . Rotator cuff tear   . Melanoma   . Kidney stone   . Heart disease     noted A Fib once  . GERD (gastroesophageal reflux disease)   . Herpes     Family History  Problem Relation Age of Onset  . Diabetes Father   . Hyperlipidemia Sister   . Heart disease Sister   . Stroke Sister   . Diabetes Brother   . Hyperlipidemia Sister   . Heart disease Sister   . Arthritis Mother   . Cancer Mother   . Heart disease Mother     History   Social History  . Marital Status: Divorced    Spouse Name: N/A    Number of Children: N/A  . Years of Education: N/A   Social History Main Topics  . Smoking status: Never Smoker   . Smokeless tobacco: Not on file  . Alcohol Use: No  . Drug Use: No  . Sexually Active: Not on file   Other Topics Concern  . Not on file   Social History Narrative  . No narrative on file    Current outpatient prescriptions:amoxicillin (AMOXIL) 875 MG tablet, Take 1 tablet (875 mg total) by mouth 2 (two) times  daily. For 10 days, Disp: 20 tablet, Rfl: 0;  aspirin 81 MG tablet, Take 81 mg by mouth daily.  , Disp: , Rfl: ;  b complex vitamins tablet, Take 1 tablet by mouth daily., Disp: , Rfl: ;  calcium carbonate (OS-CAL) 600 MG TABS, Take 1,200 mg by mouth daily., Disp: , Rfl:  cholecalciferol (VITAMIN D) 1000 UNITS tablet, Take 2,000 Units by mouth daily. , Disp: , Rfl: ;  ciprofloxacin (CIPRO) 250 MG tablet, Take 1 tablet (250 mg total) by mouth 2 (two) times daily., Disp: 6 tablet, Rfl: 0;  citalopram (CELEXA) 10 MG tablet, Take 1 tablet (10 mg total) by mouth daily., Disp: 30 tablet, Rfl: 6 clonazePAM (KLONOPIN) 0.5 MG tablet, Take 1 tablet (0.5 mg total) by mouth 2 (two) times daily as needed., Disp: 60 tablet, Rfl: 1;  cyclobenzaprine (FLEXERIL) 5 MG tablet, Take 1 tablet (5 mg total) by mouth 3 (three) times daily as needed for muscle spasms., Disp: 45 tablet, Rfl: 1;  famotidine (PEPCID) 20 MG tablet, , Disp: , Rfl: ;  fluticasone (FLONASE) 50 MCG/ACT nasal spray, , Disp: , Rfl:  gabapentin (NEURONTIN) 100 MG capsule, TAKE ONE CAPSULE BY MOUTH  EVERY DAY, Disp: 30 capsule, Rfl: 5;  HYDROcodone-acetaminophen (NORCO) 5-325 MG per tablet, , Disp: , Rfl: ;  hydrocortisone 2.5 % cream, , Disp: , Rfl: ;  loratadine (CLARITIN) 10 MG tablet, Take 10 mg by mouth daily., Disp: , Rfl: ;  metoprolol tartrate (LOPRESSOR) 25 MG tablet, Take 25 mg by mouth as needed. Takes PRN for heart racing episodes per Cards MD, Disp: , Rfl:  oxybutynin (DITROPAN) 5 MG tablet, , Disp: , Rfl: ;  predniSONE (DELTASONE) 10 MG tablet, 3 tabs x3 days and then 2 tabs x3 days and then 1 tab x3 days.  Take w/ food., Disp: 18 tablet, Rfl: 0;  triamterene-hydrochlorothiazide (MAXZIDE) 75-50 MG per tablet, , Disp: , Rfl: ;  valACYclovir (VALTREX) 500 MG tablet, Take 500 mg by mouth 2 (two) times daily., Disp: , Rfl: ;  Vitamin E 16109 UNITS CREA, Apply topically.  , Disp: , Rfl:   EXAM:  Filed Vitals:   07/01/12 1513  BP: 110/70  Temp: 97.7  F (36.5 C)    Body mass index is 20.52 kg/(m^2).  GENERAL: vitals reviewed and listed above, alert, oriented, appears well hydrated and in no acute distress  HEENT: atraumatic, conjunttiva clear, no obvious abnormalities on inspection of external nose and ears  NECK: no obvious masses on inspection  LUNGS: clear to auscultation bilaterally, no wheezes, rales or rhonchi, good air movement  CV: HRRR, no peripheral edema  ABD: soft, NTTP, no CVA TTP  MS: moves all extremities without noticeable abnormality, R lower lumbar paraspinal muscle TTP  PSYCH: pleasant and cooperative, no obvious depression or anxiety  ASSESSMENT AND PLAN:  Discussed the following assessment and plan:  1. Dysuria  Culture, Urine, POCT urinalysis dipstick  -urine dip normal -will check urine culture - if no infection advised she follow up with her IC doc for the urinary symptoms and PCP for her chronic back pain -Patient advised to return or notify a doctor immediately if symptoms worsen or persist or new concerns arise.  Patient Instructions  -drink plenty of water and fluids  -We have ordered labs or studies at this visit. It can take up to 1-2 weeks for results and processing. We will contact you with instructions IF your results are abnormal. Normal results will be released to your [facility]. If you have not heard from [place] or can not find your results in [facility] in 2 weeks please contact our office.  -follow up with Dr. Logan Bores and/or your PCP if continued symptoms     Krishav Mamone, Dahlia Client R.

## 2012-07-01 NOTE — Patient Instructions (Signed)
-  drink plenty of water and fluids  -We have ordered labs or studies at this visit. It can take up to 1-2 weeks for results and processing. We will contact you with instructions IF your results are abnormal. Normal results will be released to your [facility]. If you have not heard from [place] or can not find your results in [facility] in 2 weeks please contact our office.  -follow up with Dr. Logan Bores and/or your PCP if continued symptoms

## 2012-07-03 LAB — URINE CULTURE
Colony Count: NO GROWTH
Organism ID, Bacteria: NO GROWTH

## 2012-07-08 ENCOUNTER — Telehealth: Payer: Self-pay

## 2012-07-08 NOTE — Telephone Encounter (Signed)
Pt called and wanted her u/a and culture results.  Pt given Dr. Elmyra Ricks recommendations.  Pt states she understands.

## 2012-08-18 ENCOUNTER — Telehealth: Payer: Self-pay | Admitting: Radiology

## 2012-08-18 ENCOUNTER — Ambulatory Visit (INDEPENDENT_AMBULATORY_CARE_PROVIDER_SITE_OTHER): Payer: Medicare Other | Admitting: Family Medicine

## 2012-08-18 VITALS — BP 118/70 | HR 93 | Temp 97.5°F | Resp 18 | Ht 66.0 in | Wt 129.2 lb

## 2012-08-18 DIAGNOSIS — R209 Unspecified disturbances of skin sensation: Secondary | ICD-10-CM

## 2012-08-18 DIAGNOSIS — K219 Gastro-esophageal reflux disease without esophagitis: Secondary | ICD-10-CM

## 2012-08-18 DIAGNOSIS — F411 Generalized anxiety disorder: Secondary | ICD-10-CM

## 2012-08-18 DIAGNOSIS — R200 Anesthesia of skin: Secondary | ICD-10-CM

## 2012-08-18 MED ORDER — CLONAZEPAM 0.5 MG PO TABS: 0.5000 mg | ORAL_TABLET | Freq: Two times a day (BID) | ORAL | Status: DC | PRN

## 2012-08-18 NOTE — Telephone Encounter (Signed)
Patient c/o lip numb/ not sure if it is swollen. I have advised her to come in now. I am concerned she may be having an allergic reaction to something, and this could be severe. She is coming in now. Andris Brothers

## 2012-08-18 NOTE — Progress Notes (Signed)
[facility]:  Office Visit  Chief Complaint:  Chief Complaint  Patient presents with  . Gastrophageal Reflux  . Numbness    lower lip    HPI: [name] is a 67 y.o. female who complains of  Something fuzzy in the back of throat, reflux only in back of throat, does not have it near abdomen, just in throat x several days. She also woke up this morning with lower lip numbness, no other sxs besides those two things. Denies CP, palpitations, SOB/DOE, jaw numbness/tingling, HA, vision changes, aysymmetric weakness. She was ready to get B12 injection, no new foods, tomatoes. See's Dr. Arlyn Leak for A. Fib. Has triedtums and nexium.  Has been taking Tums, Maalox equivalent, Nexium. Was on Pepcid. She has been very stressed recently due to the loss of her dog, he died after being with her for 10 years. She has had a "terrible" time with it. She has tried to take her Klonopin and that seems to help, breaks it in half each time. She has had to take more of it. She is "not doing weel"with the loss of her dog. She denies having any SI/HI. She denies any new rashes. There is no pain with her lip numbness. No burning. She does not have any facial rashes, burning, pain.   Past Medical History  Diagnosis Date  . Vaginal delivery     ONE NSVD  . Vulvodynia   . PMDD (premenstrual dysphoric disorder)   . IC (intermittent claudication)   . Atrial fibrillation   . Melanoma of ankle   . Rotator cuff tear   . Melanoma   . Kidney stone   . Heart disease     noted A Fib once  . GERD (gastroesophageal reflux disease)   . Herpes    Past Surgical History  Procedure Laterality Date  . Vaginal hysterectomy      TVH  . Back surgery      DISC FUSION L5,L6,L7  . Bunionectomy    . Head & neck skin lesion excisional biopsy      melanoma  . Bladder surgery    . Rotator cuff repair  2012    RIGHT  . Breast surgery      BREAST BIOPSY--RIGHT BENIGN   History   Social History  . Marital  Status: Divorced    Spouse Name: N/A    Number of Children: N/A  . Years of Education: N/A   Social History Main Topics  . Smoking status: Never Smoker   . Smokeless tobacco: None  . Alcohol Use: No  . Drug Use: No  . Sexually Active: None   Other Topics Concern  . None   Social History Narrative  . None   Family History  Problem Relation Age of Onset  . Diabetes Father   . Hyperlipidemia Sister   . Heart disease Sister   . Stroke Sister   . Diabetes Brother   . Hyperlipidemia Sister   . Heart disease Sister   . Arthritis Mother   . Cancer Mother   . Heart disease Mother    Allergies  Allergen Reactions  . Erythromycin   . Latex   . Other     IV CONTRAST /"DYE".  . Septra (Bactrim)   . Sulfa Antibiotics    Prior to Admission medications   Medication Sig Start Date End Date Taking? Authorizing Provider  aspirin 81 MG tablet Take 81 mg by mouth daily.  Yes Historical Provider, MD  b complex vitamins tablet Take 1 tablet by mouth daily.   Yes Historical Provider, MD  calcium carbonate (OS-CAL) 600 MG TABS Take 1,200 mg by mouth daily.   Yes Historical Provider, MD  cholecalciferol (VITAMIN D) 1000 UNITS tablet Take 2,000 Units by mouth daily.    Yes Historical Provider, MD  clonazePAM (KLONOPIN) 0.5 MG tablet Take 1 tablet (0.5 mg total) by mouth 2 (two) times daily as needed. 06/23/12 10/24/13 Yes Sheliah Hatch, MD  famotidine (PEPCID) 20 MG tablet  03/03/12  Yes Historical Provider, MD  fluticasone (FLONASE) 50 MCG/ACT nasal spray  10/13/11  Yes Historical Provider, MD  gabapentin (NEURONTIN) 100 MG capsule TAKE ONE CAPSULE BY MOUTH EVERY DAY 03/25/11  Yes Ok Edwards, MD  metoprolol tartrate (LOPRESSOR) 25 MG tablet Take 25 mg by mouth as needed. Takes PRN for heart racing episodes per Cards MD 03/03/12  Yes Historical Provider, MD  Vitamin E 16109 UNITS CREA Apply topically.     Yes Historical Provider, MD  amoxicillin (AMOXIL) 875 MG tablet Take 1 tablet  (875 mg total) by mouth 2 (two) times daily. For 10 days 05/28/12   Sheliah Hatch, MD  ciprofloxacin (CIPRO) 250 MG tablet Take 1 tablet (250 mg total) by mouth 2 (two) times daily. 05/14/12   Rashia Mckesson P Conner Muegge, DO  citalopram (CELEXA) 10 MG tablet Take 1 tablet (10 mg total) by mouth daily. 03/10/12 03/10/13  Sheliah Hatch, MD  cyclobenzaprine (FLEXERIL) 5 MG tablet Take 1 tablet (5 mg total) by mouth 3 (three) times daily as needed for muscle spasms. 05/19/12   Sheliah Hatch, MD  HYDROcodone-acetaminophen [facility]) 5-325 MG per tablet  09/18/11   Historical Provider, MD  hydrocortisone 2.5 % cream  02/06/12   Historical Provider, MD  loratadine (CLARITIN) 10 MG tablet Take 10 mg by mouth daily.    Historical Provider, MD  oxybutynin (DITROPAN) 5 MG tablet  04/08/12   Historical Provider, MD  predniSONE (DELTASONE) 10 MG tablet 3 tabs x3 days and then 2 tabs x3 days and then 1 tab x3 days.  Take w/ food. 05/19/12   Sheliah Hatch, MD  triamterene-hydrochlorothiazide [facility]) 75-50 MG per tablet  04/07/12   Historical Provider, MD  valACYclovir (VALTREX) 500 MG tablet Take 500 mg by mouth 2 (two) times daily.    Historical Provider, MD     ROS: The patient denies fevers, chills, night sweats, unintentional weight loss, chest pain, palpitations, wheezing, dyspnea on exertion, nausea, vomiting, abdominal pain, dysuria, hematuria, melena.  All other systems have been reviewed and were otherwise negative with the exception of those mentioned in the HPI and as above.    PHYSICAL EXAM: Filed Vitals:   08/18/12 1352  BP: 118/70  Pulse: 93  Temp: 97.5 F (36.4 C)  Resp: 18   Filed Vitals:   08/18/12 1352  Height: 5\' 6"  (1.676 m)  Weight: 129 lb 3.2 oz (58.605 kg)   Body mass index is 20.86 kg/(m^2).  General: Alert, mild distress, tearfilled eyes when speaking about her dog HEENT:  Normocephalic, atraumatic, oropharynx patent. No exudates. No e/o HSV or thrush.  Cardiovascular:  Regular  rate and rhythm, no rubs murmurs or gallops.  No Carotid bruits, radial pulse intact. No pedal edema.  Respiratory: Clear to auscultation bilaterally.  No wheezes, rales, or rhonchi.  No cyanosis, no use of accessory musculature GI: No organomegaly, abdomen is soft and non-tender, positive bowel sounds.  No masses.  Skin: No rashes. Neurologic: Facial musculature symmetric. Psychiatric: Patient is appropriate throughout our interaction. She is appropriately stress/sad due to loss of dog Lymphatic: No cervical lymphadenopathy Musculoskeletal: Gait intact.   LABS:    EKG/XRAY:   Primary read interpreted by Dr. Conley Rolls at [facility].   ASSESSMENT/PLAN: Encounter Diagnoses  Name Primary?  . Numbness of lip Yes  . Generalized anxiety disorder   . GERD (gastroesophageal reflux disease)    I have asked Mrs Bouvier to monitor her sxs. She has Nexium 20 mg right now and she can take taht BID for the next 3 days, then trasnition to the prilosec samples that I gave her 20 mg daily. If she continues to have reflux sxs in back of throat then I may refer her to GI doctor. I do not see any e/o thrush, HSV or anything obvious in her oral mucosa and OP. She is under a lot of stress and I think her reflux is acting up We will also monitor her numbness in the lower lip, it has only been since this morning. She denies having CP, SOB, jaw numbness/tingling/pain radiating to shoulder. Perhaps this is stress related as well. I have advised her to moniotr sxs, take her Klonopin as scheduled rather than prn until she feels better, refilled her rx for 60pills no refills. She has another full #60 at home. She will keep my rx for when she needs to refill it later on.  I have asked her to call me in 3 days to see how she is doing.    Klea Nall PHUONG, DO 08/20/2012 10:49 AM

## 2012-08-18 NOTE — Patient Instructions (Signed)
Stress Stress-related medical problems are becoming increasingly common. The body has a built-in physical response to stressful situations. Faced with pressure, challenge or danger, we need to react quickly. Our bodies release hormones such as cortisol and adrenaline to help do this. These hormones are part of the "fight or flight" response and affect the metabolic rate, heart rate and blood pressure, resulting in a heightened, stressed state that prepares the body for optimum performance in dealing with a stressful situation. It is likely that early man required these mechanisms to stay alive, but usually modern stresses do not call for this, and the same hormones released in today's world can damage health and reduce coping ability. CAUSES  Pressure to perform at work, at school or in sports.  Threats of physical violence.  Money worries.  Arguments.  Family conflicts.  Divorce or separation from significant other.  Bereavement.  New job or unemployment.  Changes in location.  Alcohol or drug abuse. SOMETIMES, THERE IS NO PARTICULAR REASON FOR DEVELOPING STRESS. Almost all people are at risk of being stressed at some time in their lives. It is important to know that some stress is temporary and some is long term.  Temporary stress will go away when a situation is resolved. Most people can cope with short periods of stress, and it can often be relieved by relaxing, taking a walk, chatting through issues with friends, or having a good night's sleep.  Chronic (long-term, continuous) stress is much harder to deal with. It can be psychologically and emotionally damaging. It can be harmful both for an individual and for friends and family. SYMPTOMS Everyone reacts to stress differently. There are some common effects that help [place] recognize it. In times of extreme stress, people may:  Shake uncontrollably.  Breathe faster and deeper than normal (hyperventilate).  Vomit.  For people  with asthma, stress can trigger an attack.  For some people, stress may trigger migraine headaches, ulcers, and body pain. PHYSICAL EFFECTS OF STRESS MAY INCLUDE:  Loss of energy.  Skin problems.  Aches and pains resulting from tense muscles, including neck ache, backache and tension headaches.  Increased pain from arthritis and other conditions.  Irregular heart beat (palpitations).  Periods of irritability or anger.  Apathy or depression.  Anxiety (feeling uptight or worrying).  Unusual behavior.  Loss of appetite.  Comfort eating.  Lack of concentration.  Loss of, or decreased, sex-drive.  Increased smoking, drinking, or recreational drug use.  For women, missed periods.  Ulcers, joint pain, and muscle pain. Post-traumatic stress is the stress caused by any serious accident, strong emotional damage, or extremely difficult or violent experience such as rape or war. Post-traumatic stress victims can experience mixtures of emotions such as fear, shame, depression, guilt or anger. It may include recurrent memories or images that may be haunting. These feelings can last for weeks, months or even years after the traumatic event that triggered them. Specialized treatment, possibly with medicines and psychological therapies, is available. If stress is causing physical symptoms, severe distress or making it difficult for you to function as normal, it is worth seeing your caregiver. It is important to remember that although stress is a usual part of life, extreme or prolonged stress can lead to other illnesses that will need treatment. It is better to visit a doctor sooner rather than later. Stress has been linked to the development of high blood pressure and heart disease, as well as insomnia and depression. There is no diagnostic test for  stress since everyone reacts to it differently. But a caregiver will be able to spot the physical symptoms, such  as:  Headaches.  Shingles.  Ulcers. Emotional distress such as intense worry, low mood or irritability should be detected when the doctor asks pertinent questions to identify any underlying problems that might be the cause. In case there are physical reasons for the symptoms, the doctor may also want to do some tests to exclude certain conditions. If you feel that you are suffering from stress, try to identify the aspects of your life that are causing it. Sometimes you may not be able to change or avoid them, but even a small change can have a positive ripple effect. A simple lifestyle change can make all the difference. STRATEGIES THAT CAN HELP DEAL WITH STRESS:  Delegating or sharing responsibilities.  Avoiding confrontations.  Learning to be more assertive.  Regular exercise.  Avoid using alcohol or street drugs to cope.  Eating a healthy, balanced diet, rich in fruit and vegetables and proteins.  Finding humor or absurdity in stressful situations.  Never taking on more than you know you can handle comfortably.  Organizing your time better to get as much done as possible.  Talking to friends or family and sharing your thoughts and fears.  Listening to music or relaxation tapes.  Tensing and then relaxing your muscles, starting at the toes and working up to the head and neck. If you think that you would benefit from help, either in identifying the things that are causing your stress or in learning techniques to help you relax, see a caregiver who is capable of helping you with this. Rather than relying on medications, it is usually better to try and identify the things in your life that are causing stress and try to deal with them. There are many techniques of managing stress including counseling, psychotherapy, aromatherapy, yoga, and exercise. Your caregiver can help you determine what is best for you. Document Released: 09/13/2002 Document Revised: 09/15/2011 Document  Reviewed: 08/10/2007 [facility] Patient Information 2013 [place], [place]. Diet for Gastroesophageal Reflux Disease, Adult Reflux (acid reflux) is when acid from your stomach flows up into the esophagus. When acid comes in contact with the esophagus, the acid causes irritation and soreness (inflammation) in the esophagus. When reflux happens often or so severely that it causes damage to the esophagus, it is called gastroesophageal reflux disease (GERD). Nutrition therapy can help ease the discomfort of GERD. FOODS OR DRINKS TO AVOID OR LIMIT  Smoking or chewing tobacco. Nicotine is one of the most potent stimulants to acid production in the gastrointestinal tract.  Caffeinated and decaffeinated coffee and black tea.  Regular or low-calorie carbonated beverages or energy drinks (caffeine-free carbonated beverages are allowed).   Strong spices, such as black pepper, white pepper, red pepper, cayenne, curry powder, and chili powder.  Peppermint or spearmint.  Chocolate.  High-fat foods, including meats and fried foods. Extra added fats including oils, butter, salad dressings, and nuts. Limit these to less than 8 tsp per day.  Fruits and vegetables if they are not tolerated, such as citrus fruits or tomatoes.  Alcohol.  Any food that seems to aggravate your condition. If you have questions regarding your diet, call your caregiver or a registered dietitian. OTHER THINGS THAT MAY HELP GERD INCLUDE:   Eating your meals slowly, in a relaxed setting.  Eating 5 to 6 small meals per day instead of 3 large meals.  Eliminating food for a period of time  if it causes distress.  Not lying down until 3 hours after eating a meal.  Keeping the head of your bed raised 6 to 9 inches (15 to 23 cm) by using a foam wedge or blocks under the legs of the bed. Lying flat may make symptoms worse.  Being physically active. Weight loss may be helpful in reducing reflux in overweight or obese adults.  Wear  loose fitting clothing EXAMPLE MEAL PLAN This meal plan is approximately 2,000 calories based on https://www.bernard.org/ meal planning guidelines. Breakfast   cup cooked oatmeal.  1 cup strawberries.  1 cup low-fat milk.  1 oz almonds. Snack  1 cup cucumber slices.  6 oz yogurt (made from low-fat or fat-free milk). Lunch  2 slice whole-wheat bread.  2 oz sliced [place].  2 tsp mayonnaise.  1 cup blueberries.  1 cup snap peas. Snack  6 whole-wheat crackers.  1 oz string cheese. Dinner   cup brown rice.  1 cup mixed veggies.  1 tsp olive oil.  3 oz grilled fish. Document Released: 06/23/2005 Document Revised: 09/15/2011 Document Reviewed: 05/09/2011 [facility] Patient Information 2013 [place], [place].

## 2012-08-19 NOTE — Telephone Encounter (Signed)
Agree with advice given.  Patient came in for evaluation.

## 2012-08-31 ENCOUNTER — Ambulatory Visit (INDEPENDENT_AMBULATORY_CARE_PROVIDER_SITE_OTHER): Payer: Medicare Other | Admitting: Emergency Medicine

## 2012-08-31 VITALS — BP 119/73 | HR 84 | Temp 98.5°F | Resp 16 | Ht 66.0 in | Wt 128.0 lb

## 2012-08-31 DIAGNOSIS — K219 Gastro-esophageal reflux disease without esophagitis: Secondary | ICD-10-CM

## 2012-08-31 DIAGNOSIS — J018 Other acute sinusitis: Secondary | ICD-10-CM

## 2012-08-31 MED ORDER — SUCRALFATE 1 G PO TABS: 1.0000 g | ORAL_TABLET | Freq: Four times a day (QID) | ORAL | Status: DC

## 2012-08-31 MED ORDER — AMOXICILLIN-POT CLAVULANATE 875-125 MG PO TABS: 1.0000 | ORAL_TABLET | Freq: Two times a day (BID) | ORAL | Status: DC

## 2012-08-31 MED ORDER — DEXLANSOPRAZOLE 30 MG PO CPDR: 30.0000 mg | DELAYED_RELEASE_CAPSULE | Freq: Every day | ORAL | Status: DC

## 2012-08-31 NOTE — Patient Instructions (Addendum)

## 2012-08-31 NOTE — Progress Notes (Signed)
[facility] [address] [place] [address] [phone]  Date:  08/31/2012   Name:  [name]   DOB:  18-Jan-1946   MRN:  [id number]  PCP:  Neena Rhymes, MD    Chief Complaint: Facial Pain, Otalgia and Gastrophageal Reflux   History of Present Illness:  [name] is a 67 y.o. very pleasant female patient who presents with the following:  History of GERD with incomplete control of symptoms with prevacid.  Says still has frequent heart burn.  No waterbrash.  Now has new symptoms of sore throat and sinus pressure and frequent post nasal drainage.  Malaise and myalgias and fatigue.  No fever or chills.  No cough or shortness of breath or wheezing.    Patient Active Problem List  Diagnosis  . Nocturnal leg cramps  . Seasonal allergic rhinitis  . Peripheral neuropathy  . S/P cervical spinal fusion  . GERD (gastroesophageal reflux disease)  . Interstitial cystitis  . Melanoma  . History of atrial fibrillation  . General medical examination  . Bilateral leg pain  . Recurrent HSV (herpes simplex virus)  . Vulvodynia  . Anxiety  . Osteopenia  . Ureteral stone  . Hair loss  . Sinusitis  . Lumbar back pain    Past Medical History  Diagnosis Date  . Vaginal delivery     ONE NSVD  . Vulvodynia   . PMDD (premenstrual dysphoric disorder)   . IC (intermittent claudication)   . Atrial fibrillation   . Melanoma of ankle   . Rotator cuff tear   . Melanoma   . Kidney stone   . Heart disease     noted A Fib once  . GERD (gastroesophageal reflux disease)   . Herpes     Past Surgical History  Procedure Laterality Date  . Vaginal hysterectomy      TVH  . Back surgery      DISC FUSION L5,L6,L7  . Bunionectomy    . Head & neck skin lesion excisional biopsy      melanoma  . Bladder surgery    . Rotator cuff repair  2012    RIGHT  . Breast surgery      BREAST BIOPSY--RIGHT BENIGN    History  Substance Use Topics  . Smoking status: Never  Smoker   . Smokeless tobacco: Not on file  . Alcohol Use: No    Family History  Problem Relation Age of Onset  . Diabetes Father   . Hyperlipidemia Sister   . Heart disease Sister   . Stroke Sister   . Diabetes Brother   . Hyperlipidemia Sister   . Heart disease Sister   . Arthritis Mother   . Cancer Mother   . Heart disease Mother   . Diabetes Brother     Allergies  Allergen Reactions  . Erythromycin   . Latex   . Other     IV CONTRAST /"DYE".  . Septra (Bactrim)   . Sulfa Antibiotics     Medication list has been reviewed and updated.  Current Outpatient Prescriptions on File Prior to Visit  Medication Sig Dispense Refill  . aspirin 81 MG tablet Take 81 mg by mouth daily.        [name] b complex vitamins tablet Take 1 tablet by mouth daily.      . calcium carbonate (OS-CAL) 600 MG TABS Take 1,200 mg by mouth daily.      . cholecalciferol (VITAMIN  D) 1000 UNITS tablet Take 2,000 Units by mouth daily.       . clonazePAM (KLONOPIN) 0.5 MG tablet Take 1 tablet (0.5 mg total) by mouth 2 (two) times daily as needed.  60 tablet  0  . fluticasone (FLONASE) 50 MCG/ACT nasal spray       . gabapentin (NEURONTIN) 100 MG capsule TAKE ONE CAPSULE BY MOUTH EVERY DAY  30 capsule  5  . HYDROcodone-acetaminophen (NORCO) 5-325 MG per tablet       . loratadine (CLARITIN) 10 MG tablet Take 10 mg by mouth daily.      . valACYclovir (VALTREX) 500 MG tablet Take 500 mg by mouth 2 (two) times daily.      [name] amoxicillin (AMOXIL) 875 MG tablet Take 1 tablet (875 mg total) by mouth 2 (two) times daily. For 10 days  20 tablet  0  . ciprofloxacin (CIPRO) 250 MG tablet Take 1 tablet (250 mg total) by mouth 2 (two) times daily.  6 tablet  0  . citalopram (CELEXA) 10 MG tablet Take 1 tablet (10 mg total) by mouth daily.  30 tablet  6  . cyclobenzaprine (FLEXERIL) 5 MG tablet Take 1 tablet (5 mg total) by mouth 3 (three) times daily as needed for muscle spasms.  45 tablet  1  . famotidine (PEPCID) 20 MG  tablet       . hydrocortisone 2.5 % cream       . metoprolol tartrate (LOPRESSOR) 25 MG tablet Take 25 mg by mouth as needed. Takes PRN for heart racing episodes per Cards MD      . oxybutynin (DITROPAN) 5 MG tablet       . predniSONE (DELTASONE) 10 MG tablet 3 tabs x3 days and then 2 tabs x3 days and then 1 tab x3 days.  Take w/ food.  18 tablet  0  . triamterene-hydrochlorothiazide (MAXZIDE) 75-50 MG per tablet       . Vitamin E 40981 UNITS CREA Apply topically.         No current facility-administered medications on file prior to visit.    Review of Systems:  As per HPI, otherwise negative.    Physical Examination: Filed Vitals:   08/31/12 1928  BP: 119/73  Pulse: 84  Temp: 98.5 F (36.9 C)  Resp: 16   Filed Vitals:   08/31/12 1928  Height: 5\' 6"  (1.676 m)  Weight: 128 lb (58.06 kg)   Body mass index is 20.67 kg/(m^2). Ideal Body Weight: Weight in (lb) to have BMI = 25: 154.6  GEN: WDWN, NAD, Non-toxic, A & O x 3 HEENT: Atraumatic, Normocephalic. Neck supple. No masses, No LAD. Ears and Nose: No external deformity. CV: RRR, No M/G/R. No JVD. No thrill. No extra heart sounds. PULM: CTA B, no wheezes, crackles, rhonchi. No retractions. No resp. distress. No accessory muscle use. ABD: S, NT, ND, +BS. No rebound. No HSM. EXTR: No c/c/e NEURO Normal gait.  PSYCH: Normally interactive. Conversant. Not depressed or anxious appearing.  Calm demeanor.    Assessment and Plan: Sinusitis augmentin gerd h pylori carafate dexilant  [name], MD

## 2012-09-02 LAB — H. PYLORI ANTIBODY, IGG: H Pylori IgG: 0.47 {ISR}

## 2012-09-03 ENCOUNTER — Telehealth: Payer: Self-pay | Admitting: Radiology

## 2012-09-03 ENCOUNTER — Telehealth: Payer: Self-pay

## 2012-09-03 MED ORDER — AMOXICILLIN 875 MG PO TABS: 875.0000 mg | ORAL_TABLET | Freq: Two times a day (BID) | ORAL | Status: DC

## 2012-09-03 NOTE — Telephone Encounter (Signed)
Patient advised.    agrees to plan

## 2012-09-03 NOTE — Telephone Encounter (Signed)
D/C Augmentin due to diarrhea and abdominal pain.  Has tolerated Amoxicillin previously (last in 05/2012).  Meds ordered this encounter  Medications  . amoxicillin (AMOXIL) 875 MG tablet    Sig: Take 1 tablet (875 mg total) by mouth 2 (two) times daily. For 10 days    Dispense:  20 tablet    Refill:  0    Order Specific Question:  Supervising Provider    Answer:  DOOLITTLE, ROBERT P [[id number]]

## 2012-09-03 NOTE — Telephone Encounter (Signed)
Please advise if we can change to Amox, from Augmentin has sinus infection

## 2012-09-03 NOTE — Telephone Encounter (Signed)
Patient advised of normal h pylori results.

## 2012-09-03 NOTE — Telephone Encounter (Signed)
PT WOULD LIKE TO SPEAK WITH SOMEONE REGARDING THE MEDS. STATES SHE IS HAVING DIARRHEA AND STOMACH PAINS. PLEASE CALL [phone]

## 2012-09-13 ENCOUNTER — Encounter: Payer: Self-pay | Admitting: Family Medicine

## 2012-09-13 ENCOUNTER — Ambulatory Visit (INDEPENDENT_AMBULATORY_CARE_PROVIDER_SITE_OTHER): Payer: Medicare Other | Admitting: Family Medicine

## 2012-09-13 VITALS — BP 100/70 | HR 80 | Temp 98.3°F | Ht 67.0 in | Wt 131.4 lb

## 2012-09-13 DIAGNOSIS — J329 Chronic sinusitis, unspecified: Secondary | ICD-10-CM

## 2012-09-13 MED ORDER — FLUCONAZOLE 150 MG PO TABS: 150.0000 mg | ORAL_TABLET | Freq: Once | ORAL | Status: DC

## 2012-09-13 MED ORDER — DOXYCYCLINE HYCLATE 100 MG PO TABS: 100.0000 mg | ORAL_TABLET | Freq: Two times a day (BID) | ORAL | Status: DC

## 2012-09-13 NOTE — Patient Instructions (Addendum)
This is a sinus infection on the L, the ear looks good Start the Doxycycline twice daily- take w/ food Use the Flonase every day!  2 sprays each nostril Drink plenty of fluids REST! Hang in there!!!

## 2012-09-13 NOTE — Progress Notes (Signed)
  Subjective:    Patient ID: [name], female    DOB: [birth year], 67 y.o.   MRN: [id number]  HPI Seen by UC on 2/25 and dx'd w/ sinusitis.  Completed course of abx (amox).  Pt reports sxs initially started w/ burning in back of throat.  UC told her to stop Nexium, start Prilosec.  After a week, didn't feel any better.  Went back to UC, switched to Dexilant and started on Carafate.  Has appt upcoming w/ Dr Kinnie Scales.  Throat is improved.  Had stress test last week- normal- b/c she confused her GERD w/ chest pain.  'the reason i'm here today is b/c i'm now having pain in my L ear'.  No fevers.  No drainage.  Using Flonase 'occasionally'.  Not currently taking Claritin- thought this was causing palpitations.  + nasal congestion.  L sided maxillary pain.   Review of Systems For ROS see HPI     Objective:   Physical Exam  Vitals reviewed. Constitutional: She appears well-developed and well-nourished. No distress.  HENT:  Head: Normocephalic and atraumatic.  Right Ear: Tympanic membrane normal.  Left Ear: Tympanic membrane normal.  Nose: Mucosal edema and rhinorrhea present. Right sinus exhibits no maxillary sinus tenderness and no frontal sinus tenderness. Left sinus exhibits maxillary sinus tenderness. Left sinus exhibits no frontal sinus tenderness.  Mouth/Throat: Uvula is midline and mucous membranes are normal. Posterior oropharyngeal erythema present. No oropharyngeal exudate.  Eyes: Conjunctivae and EOM are normal. Pupils are equal, round, and reactive to light.  Neck: Normal range of motion. Neck supple.  Cardiovascular: Normal rate, regular rhythm and normal heart sounds.   Pulmonary/Chest: Effort normal and breath sounds normal. No respiratory distress. She has no wheezes.  Lymphadenopathy:    She has no cervical adenopathy.          Assessment & Plan:

## 2012-09-14 ENCOUNTER — Telehealth: Payer: Self-pay | Admitting: Family Medicine

## 2012-09-14 NOTE — Assessment & Plan Note (Signed)
Pt's sxs and PE consistent w/ L maxillary sinus infxn.  Start doxy due to pt's recent Amox use and multiple allergies.  Suspect pt's recurrent sinus infxns are due to untreated nasal allergies.  Stressed importance of using allergy medication regularly.  Reviewed supportive care and red flags that should prompt return.  Pt expressed understanding and is in agreement w/ plan.

## 2012-09-14 NOTE — Telephone Encounter (Signed)
i apologize- she did have a sinus infxn but her ear pain was due to the sinus pressure and untreated allergies.  So she should take the Doxy as directed and the remainder of the advice remains the same

## 2012-09-14 NOTE — Telephone Encounter (Signed)
Patient states that she was seen yesterday regarding a sinus infection. She says that she has taken two antiobiotics and would like to know when she should start feeling better. Also, she says that she still has ear pain and wants to know if there is an ear drop she could use.

## 2012-09-14 NOTE — Telephone Encounter (Signed)
Pt did not have an infxn at the time of her exam yesterday.  She had untreated allergies.  The allergy season is about to get very bad and she needs to use the meds regularly for at least 1 week until she notes improvement.  There is no ear drop available to help w/ her current sxs.

## 2012-09-14 NOTE — Telephone Encounter (Signed)
Please advise on the note.//AB/CMA 

## 2012-09-15 NOTE — Telephone Encounter (Signed)
Spoke with the pt and informed her that she does have and sinus infection and her ear pain was due to the sinus pressure and untreated allergies.   And she does not need any ear drops to help with her current sxs's.  She is to take the Doxy as directed,and use the meds regularly for at least 1 week until she notes improvement.  Pt stated she stopped her allergy meds b/c it caused her heart to beat faster.   The pt stated that she will try the allergy meds again and she how she will do, but if she has any problems she will let us know.//AB/CMA

## 2012-09-15 NOTE — Telephone Encounter (Signed)
LM @ (2:58pm) asking the pt to RTC.//AB/CMA

## 2012-09-20 ENCOUNTER — Telehealth: Payer: Self-pay | Admitting: Family Medicine

## 2012-09-20 DIAGNOSIS — J329 Chronic sinusitis, unspecified: Secondary | ICD-10-CM

## 2012-09-20 NOTE — Telephone Encounter (Signed)
Rather than additional abx- needs to see ENT for complete evaluation.

## 2012-09-20 NOTE — Telephone Encounter (Signed)
Left message to call office

## 2012-09-20 NOTE — Telephone Encounter (Signed)
Please advise 

## 2012-09-20 NOTE — Telephone Encounter (Signed)
Discuss with patient  

## 2012-09-20 NOTE — Telephone Encounter (Signed)
Patient states that she is on her 2nd round of antibiotics for a sinus infection and ear pain. She says that she does not feel any better and her ear pain is now worse. She would like to know what else Dr. Beverely Low recommends she do.

## 2012-10-27 ENCOUNTER — Encounter: Payer: Self-pay | Admitting: Family Medicine

## 2012-10-27 ENCOUNTER — Ambulatory Visit (INDEPENDENT_AMBULATORY_CARE_PROVIDER_SITE_OTHER): Payer: Medicare Other | Admitting: Family Medicine

## 2012-10-27 ENCOUNTER — Encounter: Payer: Self-pay | Admitting: Lab

## 2012-10-27 VITALS — BP 122/72 | HR 101 | Temp 98.1°F | Ht 67.0 in | Wt 128.8 lb

## 2012-10-27 DIAGNOSIS — J309 Allergic rhinitis, unspecified: Secondary | ICD-10-CM

## 2012-10-27 DIAGNOSIS — J302 Other seasonal allergic rhinitis: Secondary | ICD-10-CM

## 2012-10-27 DIAGNOSIS — F411 Generalized anxiety disorder: Secondary | ICD-10-CM

## 2012-10-27 DIAGNOSIS — F419 Anxiety disorder, unspecified: Secondary | ICD-10-CM

## 2012-10-27 NOTE — Patient Instructions (Addendum)
We'll call you with your allergy appt Continue the Flonase daily Drink plenty of fluids to wash the post-nasal drip off your throat Switch to Zyrtec in case you have built up a resistance to Claritin REST! Hang in there!

## 2012-10-27 NOTE — Progress Notes (Signed)
  Subjective:    Patient ID: [name], female    DOB: 05-Aug-1945, 67 y.o.   MRN: [id number]  HPI Sinus issue- ongoing problem for pt, had 3 rounds of abx.  Saw Dr Pollyann Kennedy at ENT.  Was told she told she did not have a sinus infxn and thought ear pain was TMJ.  Recommended dental appt.  Pt went to dentist, had xrays and was given mouth guards.  Continues to have severe ear pain on L.  Using Flonase and taking Claritin daily.  Has tried Zyrtec previously w/out relief.  Esophageal thrush- dx'd on endoscopy, tx'd w/ diflucan and protonix.  Reports she has 'very sore throat'.  Has f/u w/ GI next week.   Review of Systems For ROS see HPI     Objective:   Physical Exam  Vitals reviewed. Constitutional: She appears well-developed and well-nourished. No distress.  HENT:  Head: Normocephalic and atraumatic.  Right Ear: Tympanic membrane normal.  Left Ear: Tympanic membrane normal.  Nose: Mucosal edema and rhinorrhea present. Right sinus exhibits no maxillary sinus tenderness and no frontal sinus tenderness. Left sinus exhibits no maxillary sinus tenderness and no frontal sinus tenderness.  Mouth/Throat: Mucous membranes are normal. Posterior oropharyngeal erythema (w/ PND) present.  Eyes: Conjunctivae and EOM are normal. Pupils are equal, round, and reactive to light.  Neck: Normal range of motion. Neck supple.  Cardiovascular: Normal rate, regular rhythm and normal heart sounds.   Pulmonary/Chest: Effort normal and breath sounds normal. No respiratory distress. She has no wheezes. She has no rales.  Lymphadenopathy:    She has no cervical adenopathy.  Psychiatric:  anxious          Assessment & Plan:

## 2012-10-28 ENCOUNTER — Telehealth: Payer: Self-pay | Admitting: Internal Medicine

## 2012-10-28 ENCOUNTER — Telehealth: Payer: Self-pay | Admitting: Family Medicine

## 2012-10-28 NOTE — Telephone Encounter (Signed)
Patient called stating she is more congested than yesterday and she cannot hear out of either of her ears. She would like to know if there is something else she can do or another med she can take. Please advise.

## 2012-10-28 NOTE — Telephone Encounter (Signed)
I spoke with Florentina Addison and ok to use 11am slot tomorrow. Mary at Dr. Wille Glaser office is aware. Carron Curie, CMA

## 2012-10-28 NOTE — Telephone Encounter (Signed)
Pt notified that she needs to wait until her appt tomorrow and advise them of her symptoms. Also needs to give the Allegra time to take effect.

## 2012-10-28 NOTE — Telephone Encounter (Signed)
She just started the Allegra and has appt w/ allergy in AM.  Needs to wait until this appt for further recommendations.  There's nothing additional to be done at this time.

## 2012-10-28 NOTE — Telephone Encounter (Signed)
Pt was called back and stated that she is still having no relief. States she stopped by the outpatient pharmacy and picked up Allegra last night. Pt is scheduled for tomorrow with an allergist.

## 2012-10-29 ENCOUNTER — Other Ambulatory Visit: Payer: Medicare Other

## 2012-10-29 ENCOUNTER — Encounter: Payer: Self-pay | Admitting: Internal Medicine

## 2012-10-29 ENCOUNTER — Ambulatory Visit (INDEPENDENT_AMBULATORY_CARE_PROVIDER_SITE_OTHER): Payer: Medicare Other | Admitting: Internal Medicine

## 2012-10-29 VITALS — BP 108/60 | HR 100 | Ht 66.0 in | Wt 128.4 lb

## 2012-10-29 DIAGNOSIS — J329 Chronic sinusitis, unspecified: Secondary | ICD-10-CM

## 2012-10-29 DIAGNOSIS — H9209 Otalgia, unspecified ear: Secondary | ICD-10-CM

## 2012-10-29 DIAGNOSIS — H9203 Otalgia, bilateral: Secondary | ICD-10-CM

## 2012-10-29 DIAGNOSIS — J309 Allergic rhinitis, unspecified: Secondary | ICD-10-CM

## 2012-10-29 DIAGNOSIS — J302 Other seasonal allergic rhinitis: Secondary | ICD-10-CM

## 2012-10-29 NOTE — Patient Instructions (Addendum)
Sample Dymista nasal spray   Try 1-2 puffs each nostril once daily at bedtime  Try this instead of Nasonex  Order- lab Allergy profile  Order- CT maxillofacial- not limited. Need to include inner ear and eustachian tubes   Dx chronic sinusitis, otalgia,

## 2012-10-29 NOTE — Assessment & Plan Note (Signed)
Deteriorated.  Pt w/out evidence of current infxn.  Has seen ENT and they also felt that she was not having sinus issues.  No evidence of ear pathology on their exam.  Pt developed esophageal thrush due to multiple rounds of abx- no abx today.  Encouraged continued use of allergy meds and suggested switching OTC antihistamines in case of tolerance.  Will refer to allergist at this time b/c I have exhausted all other treatment options.  Pt expressed understanding and is in agreement w/ plan.

## 2012-10-29 NOTE — Progress Notes (Signed)
10/29/12- 58 yoF Remote smoker referred courtesy of Dr Levander Campion allergies since January-Left ear pain and down neck-seen ENT as well. She complains of burning in the throat. Pain behind  Left ear. This started as a feeling of maxillary sinus pressure in mid January and got gradually worse. Not relieved by 3 rounds of antibiotics. ENT/ Dr Pollyann Kennedy found no evidence of sinus disease and attributed her pain to either Cervical spine DGD or to TMJ. She has had no ENT surgery but has had a history of seasonal allergic rhinitis. 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 Still hurts some to swallow but not to chew. Dental bite guard did not help. Eyes water. Has had GI evaluation by Dr.Medof with upper endoscopy and biopsy Describes waking with frontal headache each morning and feels puffy around the eyes, left greater than right. Hx cervical spine disc surgery twice with anterior and posterior approaches. History of melanoma resected from right ankle.  Prior to Admission medications   Medication Sig Start Date End Date Taking? Authorizing Provider  aspirin 81 MG tablet Take 81 mg by mouth daily.     Yes Historical Provider, MD  b complex vitamins tablet Take 1 tablet by mouth daily.   Yes Historical Provider, MD  calcium carbonate (OS-CAL) 600 MG TABS Take 1,200 mg by mouth daily.   Yes Historical Provider, MD  cholecalciferol (VITAMIN D) 1000 UNITS tablet Take 2,000 Units by mouth daily.    Yes Historical Provider, MD  clonazePAM (KLONOPIN) 0.5 MG tablet Take 1 tablet (0.5 mg total) by mouth 2 (two) times daily as needed. 08/18/12 12/19/13 Yes Thao P Le, DO  Dexlansoprazole 30 MG capsule Take 1 capsule (30 mg total) by mouth daily. 08/31/12  Yes Phillips Odor, MD  fluticasone [facility]) 50 MCG/ACT nasal spray  10/13/11  Yes Historical Provider, MD  gabapentin (NEURONTIN) 100 MG capsule TAKE ONE CAPSULE BY MOUTH EVERY DAY 03/25/11  Yes Ok Edwards, MD  HYDROcodone-acetaminophen [facility]) 5-325 MG per  tablet  09/18/11  Yes Historical Provider, MD  loratadine (CLARITIN) 10 MG tablet Take 10 mg by mouth daily.   Yes Historical Provider, MD  Lysine 500 MG TABS Take 1 tablet by mouth daily.   Yes Historical Provider, MD  metoprolol tartrate (LOPRESSOR) 25 MG tablet Take 25 mg by mouth as needed. Takes PRN for heart racing episodes per Cards MD 03/03/12  Yes Historical Provider, MD  pantoprazole (PROTONIX) 40 MG tablet Take 40 mg by mouth daily.   Yes Historical Provider, MD  Probiotic Product (PROBIOTIC DAILY PO) Take 1 capsule by mouth daily.   Yes Historical Provider, MD  valACYclovir (VALTREX) 500 MG tablet Take 500 mg by mouth 2 (two) times daily.   Yes Historical Provider, MD  antipyrine-benzocaine [facility]) otic solution 2-4 drops into the affected ear 2-3 times daily as needed 11/02/12   Waymon Budge, MD  azelastine (ASTELIN) 137 MCG/SPRAY nasal spray 1-2 sprays in each nostril twice a day 11/03/12   Waymon Budge, MD  Azelastine-Fluticasone [facility]) 137-50 MCG/ACT SUSP Place 1-2 sprays into the nose at bedtime. 11/02/12   Waymon Budge, MD  famotidine (PEPCID) 20 MG tablet  03/03/12   Historical Provider, MD   Past Medical History  Diagnosis Date  . Vaginal delivery     ONE NSVD  . Vulvodynia   . PMDD (premenstrual dysphoric disorder)   . IC (intermittent claudication)   . Atrial fibrillation   . Melanoma of ankle   . Rotator cuff tear   .  Melanoma   . Kidney stone   . Heart disease     noted A Fib once  . GERD (gastroesophageal reflux disease)   . Herpes    Past Surgical History  Procedure Laterality Date  . Vaginal hysterectomy      TVH  . Back surgery      DISC FUSION L5,L6,L7  . Bunionectomy    . Head & neck skin lesion excisional biopsy      melanoma  . Bladder surgery    . Rotator cuff repair  2012    RIGHT  . Breast surgery      BREAST BIOPSY--RIGHT BENIGN   Family History  Problem Relation Age of Onset  . Diabetes Father   . Hyperlipidemia Sister   .  Heart disease Sister   . Stroke Sister   . Diabetes Brother   . Hyperlipidemia Sister   . Heart disease Sister   . Arthritis Mother   . Cancer Mother     uterus  . Heart disease Mother   . Diabetes Brother    History   Social History  . Marital Status: Divorced    Spouse Name: N/A    Number of Children: N/A  . Years of Education: N/A   Occupational History  . Not on file.   Social History Main Topics  . Smoking status: Former Smoker -- 0.25 packs/day for 14 years    Types: Cigarettes  . Smokeless tobacco: Not on file  . Alcohol Use: No  . Drug Use: No  . Sexually Active: Not on file   Other Topics Concern  . Not on file   Social History Narrative  . No narrative on file   ROS-see HPI Constitutional:   No-   weight loss, night sweats, fevers, chills, fatigue, lassitude. HEENT:   + headaches, +difficulty swallowing, No-tooth/dental problems, +sore throat,       No-  sneezing, itching, ear ache, +nasal congestion, post nasal drip,  CV:  No-   chest pain, orthopnea, PND, swelling in lower extremities, anasarca,                                  dizziness, palpitations Resp: No-   shortness of breath with exertion or at rest.              No-   productive cough,  No non-productive cough,  No- coughing up of blood.              No-   change in color of mucus.  No- wheezing.   Skin: No-   rash or lesions. GI:  No-   heartburn, indigestion, abdominal pain, nausea, vomiting, diarrhea,                 change in bowel habits, loss of appetite GU: No-   dysuria, change in color of urine, no urgency or frequency.  No- flank pain. MS:  No-   joint pain or swelling.  No- decreased range of motion.  No- back pain. Neuro-     nothing unusual Psych:  No- change in mood or affect. No depression or anxiety.  No memory loss.  OBJ- Physical Exam General- Alert, Oriented, Affect-appropriate, Distress- none acute. Trim. Skin- rash-none, lesions- none, excoriation- none Lymphadenopathy-  none Head- atraumatic            Eyes- Gross vision intact, PERRLA, conjunctivae and secretions clear  Ears- cerumen R, L side normal.            Nose- Clear, no-Septal dev, mucus, polyps, erosion, perforation             Throat- Mallampati III , mucosa+red , drainage- none, tonsils- atrophic. Own teeth Neck- flexible , trachea midline, no stridor , thyroid nl, carotid no bruit Chest - symmetrical excursion , unlabored           Heart/CV- RRR , no murmur , no gallop  , no rub, nl s1 s2                           - JVD- none , edema- none, stasis changes- none, varices- none           Lung- clear to P&A, wheeze- none, cough- none , dullness-none, rub- none           Chest wall-  Abd- tender-no, distended-no, bowel sounds-present, HSM- no Br/ Gen/ Rectal- Not done, not indicated Extrem- cyanosis- none, clubbing, none, atrophy- none, strength- nl Neuro- grossly intact to observation

## 2012-11-01 ENCOUNTER — Ambulatory Visit (INDEPENDENT_AMBULATORY_CARE_PROVIDER_SITE_OTHER)
Admission: RE | Admit: 2012-11-01 | Discharge: 2012-11-01 | Disposition: A | Discharge status: Home / Self Care | Payer: Medicare Other | Source: Ambulatory Visit | Attending: Internal Medicine | Admitting: Internal Medicine

## 2012-11-01 DIAGNOSIS — J329 Chronic sinusitis, unspecified: Secondary | ICD-10-CM

## 2012-11-01 DIAGNOSIS — H9209 Otalgia, unspecified ear: Secondary | ICD-10-CM

## 2012-11-01 DIAGNOSIS — H9203 Otalgia, bilateral: Secondary | ICD-10-CM

## 2012-11-01 LAB — ALLERGY FULL PROFILE
Allergen, D pternoyssinus,d7: <0.1 kU/L
Allergen,Goose feathers, e70: <0.1 kU/L
Alternaria Alternata: <0.1 kU/L
Aspergillus fumigatus, m3: <0.1 kU/L
Bahia Grass: <0.1 kU/L
Bermuda Grass: <0.1 kU/L
Box Elder IgE: <0.1 kU/L
Candida Albicans: <0.1 kU/L
Cat Dander: <0.1 kU/L
Common Ragweed: <0.1 kU/L
Curvularia lunata: <0.1 kU/L
D. farinae: <0.1 kU/L
Dog Dander: <0.1 kU/L
Elm IgE: <0.1 kU/L
Fescue: <0.1 kU/L
G005 Rye, Perennial: <0.1 kU/L
G009 Red Top: <0.1 kU/L
Goldenrod: <0.1 kU/L
Helminthosporium halodes: <0.1 kU/L
House Dust Hollister: <0.1 kU/L
IgE (Immunoglobulin E), Serum: 12.2 IU/mL (ref 0.0–180.0)
Lamb's Quarters: <0.1 kU/L
Oak: <0.1 kU/L
Plantain: <0.1 kU/L
Stemphylium Botryosum: <0.1 kU/L
Sycamore Tree: <0.1 kU/L
Timothy Grass: <0.1 kU/L

## 2012-11-02 ENCOUNTER — Telehealth: Payer: Self-pay | Admitting: Internal Medicine

## 2012-11-02 MED ORDER — ANTIPYRINE-BENZOCAINE 5.4-1.4 % OT SOLN: OTIC | Status: DC

## 2012-11-02 MED ORDER — AZELASTINE-FLUTICASONE 137-50 MCG/ACT NA SUSP: 1.0000 | Freq: Every day | NASAL | Status: DC

## 2012-11-02 NOTE — Progress Notes (Signed)
Quick Note:  Pt aware of results via Mindy through phone note 11-02-12. ______ 

## 2012-11-02 NOTE — Progress Notes (Signed)
Quick Note:  Pt aware of results via Mindy through phone note 11-02-12. ______

## 2012-11-02 NOTE — Telephone Encounter (Signed)
Notes Recorded by Waymon Budge, MD on 11/01/2012 at 1:13 PM Allergy profile does not show elevated allergy antibodies. We will discuss at next ov. Notes Recorded by Waymon Budge, MD on 11/01/2012 at 5:06 PM CT max fac- Looks normal, except for one small cell of fluid in the mastoid area of the skull on the left. It might possibly hurt, but not expected ---  I spoke with patient about results and she verbalized understanding and had no questions. Pt does c/o Left ear feels stopped up, is painful, hurts inside ear, hearing is loss, sore throat. Has been to ENT said the ear looked fine. She could either have TMJ. She went to the dentists did not find anything. Please advise Dr. Maple Hudson thanks Last OV 10/29/12 Pending 12/06/12 Allergies  Allergen Reactions  . Contrast Media (Iodinated Diagnostic Agents)   . Erythromycin   . Latex   . Other     IV CONTRAST /"DYE".  . Septra (Bactrim)   . Sulfa Antibiotics

## 2012-11-02 NOTE — Telephone Encounter (Signed)
Called spoke with patient Advised of CY's recs as stated below Pt okay with these recmmendations and verbalized her understanding Also, pt stated that the dymista given at the 4.25.14 ov works very well for her and would like a prescription Per CY: this is fine.  If insurance will not pay for the Dymista, will need to supplement with astelin and flonase Patient is aware the 2 rx's have been sent to her verified pharmacy and will contact the office if her insurance will not pay for the Dymista (she will check the price before picking up the rx)

## 2012-11-02 NOTE — Telephone Encounter (Signed)
Offer Rx Auralgan(antipyrine/ benzocaine otic) (A-B otic) 1 vial- 2-4 drops in ear, 2-3 times daily as needed

## 2012-11-03 ENCOUNTER — Telehealth: Payer: Self-pay | Admitting: Internal Medicine

## 2012-11-03 MED ORDER — AZELASTINE HCL 0.1 % NA SOLN: NASAL | Status: DC

## 2012-11-03 NOTE — Telephone Encounter (Signed)
Per CY-lets see if RX for "a-B otic" generic would work (same directions) and okay to Rx Flonase #1 1-2 sprays in each nostril QHS with prn refill and Astelin #1 1-2 sprays in each nostril BID prn with prn refills.

## 2012-11-03 NOTE — Telephone Encounter (Signed)
I called pt pharmacy. W/o insurance the ear drops will be $15 as it would not give them an alternative.  I called and made pt aware of this. She was fine with this. Also she stated she had plenty of flonase at home she would just need the astelin sent. Nothing further was needed

## 2012-11-03 NOTE — Telephone Encounter (Signed)
Spoke with pt She states dymista and auralgan gtts were denied by [facility] She is requesting alternatives, but the pharm would not provide any Dymista sample works well- could we call in the 2 meds that make up dymista? Please advise on alternatives thanks! Allergies  Allergen Reactions  . Contrast Media (Iodinated Diagnostic Agents)   . Erythromycin   . Latex   . Other     IV CONTRAST /"DYE".  . Septra (Bactrim)   . Sulfa Antibiotics   ;ler

## 2012-11-07 NOTE — Assessment & Plan Note (Signed)
Unclear she is describing discomfort related to her sinuses. Plan-CT of sinuses to include ears for basis of left ear pain.

## 2012-11-07 NOTE — Assessment & Plan Note (Signed)
He is a history suggesting seasonal allergic rhinitis in the past. Not clear the this is causing the discomfort she describes now as a pain in the left ear extending down the left side of her neck eustachian tube area Plan-plan allergy profile. Tried changing Nasonex to Dymista to include antihistamine component

## 2012-11-20 ENCOUNTER — Ambulatory Visit (INDEPENDENT_AMBULATORY_CARE_PROVIDER_SITE_OTHER): Payer: Medicare Other | Admitting: Emergency Medicine

## 2012-11-20 VITALS — BP 142/74 | HR 100 | Temp 97.7°F | Resp 18 | Ht 66.0 in | Wt 128.0 lb

## 2012-11-20 DIAGNOSIS — M26609 Unspecified temporomandibular joint disorder, unspecified side: Secondary | ICD-10-CM

## 2012-11-20 DIAGNOSIS — M542 Cervicalgia: Secondary | ICD-10-CM

## 2012-11-20 NOTE — Progress Notes (Signed)
[facility] [address] [place] [address] [phone]  Date:  11/20/2012   Name:  [name]   DOB:  31-Oct-1945   MRN:  [id number]  PCP:  Neena Rhymes, MD    Chief Complaint: Headache, Otalgia and Jaw Pain   History of Present Illness:  [name] is a 67 y.o. very pleasant female patient who presents with the following:  Has numerous complaints and concerns that have driven her from work early. She noted an elevated blood pressure and came here for evaluation.  Concerned that the pain in her left ear and jaw was related to her heart, since her pressure is elevated.  She denies chest pain, shortness of breath, palpitations.  Treated for TMJ by dentist, seen by ENT who did  Ct that was not diagnostic.  She is complaining today of pain in her left ear and jaw that is same as she has been experiencing previously.  Denies nasal congestion or drainage, fever or chills, cough or coryza.  No nausea or vomiting.  She has no good days and almost always bad days.  She cannot describe how she feels when she feels "bad".  No improvement with over the counter medications or other home remedies. Denies other complaint or health concern today. Rejects taking SSRI as she has never done well on them and has been fatigued while taking them.  Patient Active Problem List   Diagnosis Date Noted  . Sinusitis 05/19/2012  . Lumbar back pain 05/19/2012  . Hair loss 04/12/2012  . Recurrent HSV (herpes simplex virus) 02/18/2012  . 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 Anxiety 02/18/2012  . Osteopenia 02/18/2012  . Ureteral stone 02/18/2012  . General medical examination 11/17/2011  . Bilateral leg pain 11/17/2011  . Nocturnal leg cramps 11/06/2011  . Seasonal allergic rhinitis 11/06/2011  . Peripheral neuropathy 11/06/2011  . S/P cervical spinal fusion 11/06/2011  . GERD (gastroesophageal reflux disease) 11/06/2011  . Interstitial cystitis 11/06/2011  . Melanoma 11/06/2011  . History  of atrial fibrillation 11/06/2011    Past Medical History  Diagnosis Date  . Vaginal delivery     ONE NSVD  . Vulvodynia   . PMDD (premenstrual dysphoric disorder)   . IC (intermittent claudication)   . Atrial fibrillation   . Melanoma of ankle   . Rotator cuff tear   . Melanoma   . Kidney stone   . Heart disease     noted A Fib once  . GERD (gastroesophageal reflux disease)   . Herpes     Past Surgical History  Procedure Laterality Date  . Vaginal hysterectomy      TVH  . Back surgery      DISC FUSION L5,L6,L7  . Bunionectomy    . Head & neck skin lesion excisional biopsy      melanoma  . Bladder surgery    . Rotator cuff repair  2012    RIGHT  . Breast surgery      BREAST BIOPSY--RIGHT BENIGN    History  Substance Use Topics  . Smoking status: Former Smoker -- 0.25 packs/day for 14 years    Types: Cigarettes  . Smokeless tobacco: Not on file  . Alcohol Use: No    Family History  Problem Relation Age of Onset  . Diabetes Father   . Hyperlipidemia Sister   . Heart disease Sister   . Stroke Sister   . Diabetes Brother   . Hyperlipidemia Sister   .  Heart disease Sister   . Arthritis Mother   . Cancer Mother     uterus  . Heart disease Mother   . Diabetes Brother     Allergies  Allergen Reactions  . Contrast Media (Iodinated Diagnostic Agents)   . Erythromycin   . Latex   . Other     IV CONTRAST /"DYE".  . Septra (Bactrim)   . Sulfa Antibiotics     Medication list has been reviewed and updated.  Current Outpatient Prescriptions on File Prior to Visit  Medication Sig Dispense Refill  . antipyrine-benzocaine (AURALGAN) otic solution 2-4 drops into the affected ear 2-3 times daily as needed  10 mL  0  . aspirin 81 MG tablet Take 81 mg by mouth daily.        [name] azelastine (ASTELIN) 137 MCG/SPRAY nasal spray 1-2 sprays in each nostril twice a day  30 mL  prn  . Azelastine-Fluticasone (DYMISTA) 137-50 MCG/ACT SUSP Place 1-2 sprays into the nose at  bedtime.  23 g  3  . b complex vitamins tablet Take 1 tablet by mouth daily.      . calcium carbonate (OS-CAL) 600 MG TABS Take 1,200 mg by mouth daily.      . cholecalciferol (VITAMIN D) 1000 UNITS tablet Take 2,000 Units by mouth daily.       . clonazePAM (KLONOPIN) 0.5 MG tablet Take 1 tablet (0.5 mg total) by mouth 2 (two) times daily as needed.  60 tablet  0  . Dexlansoprazole 30 MG capsule Take 1 capsule (30 mg total) by mouth daily.  30 capsule  2  . famotidine (PEPCID) 20 MG tablet       . fluticasone (FLONASE) 50 MCG/ACT nasal spray       . gabapentin (NEURONTIN) 100 MG capsule TAKE ONE CAPSULE BY MOUTH EVERY DAY  30 capsule  5  . HYDROcodone-acetaminophen (NORCO) 5-325 MG per tablet       . loratadine (CLARITIN) 10 MG tablet Take 10 mg by mouth daily.      [name] Lysine 500 MG TABS Take 1 tablet by mouth daily.      . metoprolol tartrate (LOPRESSOR) 25 MG tablet Take 25 mg by mouth as needed. Takes PRN for heart racing episodes per Cards MD      . pantoprazole (PROTONIX) 40 MG tablet Take 40 mg by mouth daily.      . Probiotic Product (PROBIOTIC DAILY PO) Take 1 capsule by mouth daily.      . valACYclovir (VALTREX) 500 MG tablet Take 500 mg by mouth 2 (two) times daily.       No current facility-administered medications on file prior to visit.    Review of Systems:  As per HPI, otherwise negative.    Physical Examination: Filed Vitals:   11/20/12 1638  BP: 142/74  Pulse: 100  Temp: 97.7 F (36.5 C)  Resp: 18   Filed Vitals:   11/20/12 1638  Height: 5\' 6"  (1.676 m)  Weight: 128 lb (58.06 kg)   Body mass index is 20.67 kg/(m^2). Ideal Body Weight: Weight in (lb) to have BMI = 25: 154.6   GEN: WDWN, NAD, Non-toxic, A & O x 3  Tearful and upset HEENT: Atraumatic, Normocephalic. Neck supple. No masses, No LAD.  Tender left TMJ Ears and Nose: No external deformity. CV: RRR, No M/G/R. No JVD. No thrill. No extra heart sounds. PULM: CTA B, no wheezes, crackles, rhonchi. No  retractions. No resp. distress. No accessory muscle  use. ABD: S, NT, ND, +BS. No rebound. No HSM. EXTR: No c/c/e NEURO Normal gait.  PSYCH: Normally interactive. Conversant. Not depressed or anxious appearing.  Calm demeanor.    Assessment and Plan: Otalgia TMJ dysfunction Offered SSRI and refused   Signed,  Phillips Odor, MD   EKG negative

## 2012-11-21 ENCOUNTER — Telehealth: Payer: Self-pay

## 2012-11-21 NOTE — Telephone Encounter (Signed)
Dr.Anderson, Pt would like to speak to you regarding her blood pressure. Best# [phone]

## 2012-11-22 NOTE — Telephone Encounter (Signed)
Called her to advise her BP was 142/74 she states has been higher recently.

## 2012-11-26 ENCOUNTER — Ambulatory Visit (INDEPENDENT_AMBULATORY_CARE_PROVIDER_SITE_OTHER): Payer: Medicare Other | Admitting: Family Medicine

## 2012-11-26 ENCOUNTER — Encounter: Payer: Self-pay | Admitting: Family Medicine

## 2012-11-26 VITALS — BP 124/76 | HR 102 | Temp 98.0°F | Resp 16 | Wt 127.1 lb

## 2012-11-26 DIAGNOSIS — F411 Generalized anxiety disorder: Secondary | ICD-10-CM

## 2012-11-26 DIAGNOSIS — J309 Allergic rhinitis, unspecified: Secondary | ICD-10-CM

## 2012-11-26 DIAGNOSIS — J302 Other seasonal allergic rhinitis: Secondary | ICD-10-CM

## 2012-11-26 DIAGNOSIS — F419 Anxiety disorder, unspecified: Secondary | ICD-10-CM

## 2012-11-26 NOTE — Progress Notes (Signed)
  Subjective:    Patient ID: [name], female    DOB: [birth year], 67 y.o.   MRN: [id number]  HPI Pt reports she was feeling poorly last Saturday while at work.  BP was reading '150 something'.  Went to [facility].  Pt has seen Allergist, ENT, Dentist for ongoing jaw/ear pain/HA.  [facility] thought it was TMJ and also anxiety/depression and wanted to start her on meds.  Pt got angry and refused.  Pt reports having some current HA/discomfort behind L ear.  Pt also having upset stomach x1-2 weeks.  Reports mental 'fog'.  Denies sinus pain/pressure.  Has had persistent sore throat since endoscopy.  No fevers.   Review of Systems For ROS see HPI     Objective:   Physical Exam  Vitals reviewed. Constitutional: She appears well-developed and well-nourished. No distress.  HENT:  Head: Normocephalic and atraumatic.  Right Ear: Tympanic membrane normal.  Left Ear: Tympanic membrane normal.  Nose: Mucosal edema and rhinorrhea present. Right sinus exhibits no maxillary sinus tenderness and no frontal sinus tenderness. Left sinus exhibits no maxillary sinus tenderness and no frontal sinus tenderness.  Mouth/Throat: Mucous membranes are normal. Posterior oropharyngeal erythema (w/ PND) present.  Eyes: Conjunctivae and EOM are normal. Pupils are equal, round, and reactive to light.  Neck: Normal range of motion. Neck supple.  Cardiovascular: Normal rate, regular rhythm, normal heart sounds and intact distal pulses.   Pulmonary/Chest: Effort normal and breath sounds normal. No respiratory distress. She has no wheezes. She has no rales.  Lymphadenopathy:    She has no cervical adenopathy.  Psychiatric:  Anxious, bordering on tearful          Assessment & Plan:

## 2012-11-26 NOTE — Patient Instructions (Addendum)
Your physical exam looks good- this is good news! Continue the allergy pill and the nasal spray daily Clonazepam can cause mental clouding/fog- consider cutting back Drink plenty of fluids REST! Hang in there!!!

## 2012-11-30 ENCOUNTER — Telehealth: Payer: Self-pay | Admitting: General Practice

## 2012-11-30 DIAGNOSIS — F411 Generalized anxiety disorder: Secondary | ICD-10-CM

## 2012-11-30 NOTE — Assessment & Plan Note (Signed)
Unchanged.  No evidence of infxn on PE.  No cause for pt's L ear pain on PE.  Pt has seen multiple specialist for this and no cause has been found.  Discussed that this may be physical manifestation of her depression/anxiety.  Pt not interested in that explanation.

## 2012-11-30 NOTE — Assessment & Plan Note (Signed)
Deteriorating.  Agree w/ UC MD that a lot of pt's vague and migrating complaints are likely physical manifestations of her depression/anxiety.  Pt unwilling to accept this explanation.  Pt almost disappointed that multiple specialists have not found anything wrong w/ her.  Discussed that her regular use of benzos can contribute to the mental clouding she complains of.  Also recommended daily SSRI- pt refuses.  Will continue to follow.

## 2012-11-30 NOTE — Telephone Encounter (Signed)
Pt called very tearful on the phone. States that she is in need of something very low dose for her anxiety. Per last OV note this was discussed. Pt states that she just needs something to help her get through these medical issues.

## 2012-12-01 MED ORDER — CLONAZEPAM 0.5 MG PO TABS: 0.5000 mg | ORAL_TABLET | Freq: Two times a day (BID) | ORAL | Status: DC | PRN

## 2012-12-01 MED ORDER — FLUOXETINE HCL 10 MG PO CAPS: 10.0000 mg | ORAL_CAPSULE | Freq: Every day | ORAL | Status: DC

## 2012-12-01 NOTE — Telephone Encounter (Signed)
Rx, Controlled substance agreement placed up front for pick up. Pt notified.

## 2012-12-01 NOTE — Telephone Encounter (Signed)
Ok for #30 of Klonopin- needs controlled substance agreement and UDS Must start 10mg  Prozac daily to better control depression and anxiety, #30, 3 refills.  Should strongly consider calling psychiatrist

## 2012-12-06 ENCOUNTER — Ambulatory Visit: Payer: Medicare Other | Admitting: Internal Medicine

## 2012-12-09 ENCOUNTER — Telehealth: Payer: Self-pay | Admitting: Family Medicine

## 2012-12-09 MED ORDER — CITALOPRAM HYDROBROMIDE 10 MG PO TABS: 10.0000 mg | ORAL_TABLET | Freq: Every day | ORAL | Status: DC

## 2012-12-09 NOTE — Telephone Encounter (Signed)
Med filled and pt notified.  

## 2012-12-09 NOTE — Telephone Encounter (Signed)
Stopped Prozac- start Celexa 10mg  daily.  #30, 3 refills

## 2012-12-09 NOTE — Telephone Encounter (Signed)
Patient called in stating that she has concerns about the new medication she is taking. She says that Prozac is making her have awful nightmares at night and she feels "drugged" during the day. Patient is wanting to know if there is another medication that she could try for anxiety?

## 2012-12-09 NOTE — Telephone Encounter (Signed)
Patient called back stating that she was previously on another medication for anxiety and would like to try that med again.

## 2012-12-16 ENCOUNTER — Encounter: Payer: Medicare Other | Admitting: Family Medicine

## 2012-12-17 ENCOUNTER — Encounter: Payer: Self-pay | Admitting: Family Medicine

## 2012-12-25 ENCOUNTER — Ambulatory Visit (INDEPENDENT_AMBULATORY_CARE_PROVIDER_SITE_OTHER): Payer: Medicare Other | Admitting: Emergency Medicine

## 2012-12-25 ENCOUNTER — Telehealth: Payer: Self-pay

## 2012-12-25 VITALS — BP 120/70 | HR 100 | Temp 97.7°F | Resp 16 | Ht 66.5 in | Wt 125.4 lb

## 2012-12-25 DIAGNOSIS — R079 Chest pain, unspecified: Secondary | ICD-10-CM

## 2012-12-25 DIAGNOSIS — F411 Generalized anxiety disorder: Secondary | ICD-10-CM

## 2012-12-25 DIAGNOSIS — R5381 Other malaise: Secondary | ICD-10-CM

## 2012-12-25 DIAGNOSIS — R5383 Other fatigue: Secondary | ICD-10-CM

## 2012-12-25 DIAGNOSIS — I471 Supraventricular tachycardia: Secondary | ICD-10-CM

## 2012-12-25 DIAGNOSIS — R42 Dizziness and giddiness: Secondary | ICD-10-CM

## 2012-12-25 LAB — POCT CBC
Granulocyte percent: 79.1 %G (ref 37–80)
HCT, POC: 45.6 % (ref 37.7–47.9)
Hemoglobin: 14.3 g/dL (ref 12.2–16.2)
Lymph, poc: 1.1 (ref 0.6–3.4)
MCH, POC: 31.9 pg — AB (ref 27–31.2)
MCHC: 31.4 g/dL — AB (ref 31.8–35.4)
MCV: 101.8 fL — AB (ref 80–97)
MID (cbc): 0.4 (ref 0–0.9)
MPV: 7.9 fL (ref 0–99.8)
POC Granulocyte: 5.5 (ref 2–6.9)
POC LYMPH PERCENT: 15.7 %L (ref 10–50)
POC MID %: 5.2 %M (ref 0–12)
Platelet Count, POC: 219 10*3/uL (ref 142–424)
RBC: 4.48 M/uL (ref 4.04–5.48)
RDW, POC: 15 %
WBC: 7 10*3/uL (ref 4.6–10.2)

## 2012-12-25 LAB — COMPREHENSIVE METABOLIC PANEL
ALT: 17 U/L (ref 0–35)
AST: 21 U/L (ref 0–37)
Albumin: 4.2 g/dL (ref 3.5–5.2)
Alkaline Phosphatase: 63 U/L (ref 39–117)
BUN: 15 mg/dL (ref 6–23)
CO2: 27 mEq/L (ref 19–32)
Calcium: 9.5 mg/dL (ref 8.4–10.5)
Chloride: 107 mEq/L (ref 96–112)
Creat: 0.81 mg/dL (ref 0.50–1.10)
Glucose, Bld: 107 mg/dL — ABNORMAL HIGH (ref 70–99)
Potassium: 4.2 mEq/L (ref 3.5–5.3)
Sodium: 143 mEq/L (ref 135–145)
Total Bilirubin: 0.6 mg/dL (ref 0.3–1.2)
Total Protein: 6.8 g/dL (ref 6.0–8.3)

## 2012-12-25 LAB — POCT SEDIMENTATION RATE: POCT SED RATE: 25 mm/hr — AB (ref 0–22)

## 2012-12-25 LAB — TSH: TSH: 1.43 u[IU]/mL (ref 0.350–4.500)

## 2012-12-25 LAB — T4, FREE: Free T4: 1.17 ng/dL (ref 0.80–1.80)

## 2012-12-25 NOTE — Telephone Encounter (Signed)
PATIENT does not have enough clonazePAM (KLONOPIN) 0.5 MG tablet To take two a day.  Needs a new script   [facility]   [phone]

## 2012-12-25 NOTE — Progress Notes (Signed)
Subjective:    Patient ID: [name], female    DOB: [birth year], 67 y.o.   MRN: [id number]  HPI presents today with weakness in hands yesterday. Has a little bit of a tremble in both hands. Does have hx of carpal tunnel. Slept in wrist braces last night and doesn't feel the tremble  as bad in her hands today. Legs feel heavy and and weak. Does have hx of neuropathy but this is different. Over all feels not in present. Does have a lot of stress  Trying to sale house. and thinks it might be anxiety.  Had an endoscopy in March and has still been having sore throat. She has been d/c from surgeon, Dr. Kinnie Scales, and when she called about her sxs he said to take Ranitidine for 2 weeks and then follow up. Biopsy from endoscopy was normal. He doesn't think she has reflux. January had to have pet put to sleep and was upset over that. Was seen by Cardiologist, Dr. Donnie Aho, stress test this current year and that was normal.  She feels off balance. She cries a lot. She has tried Celexa, Prozac, and Cymbalta but wasn't able to take these because they make her feel so tired, drugged, and feels like she cant do anything. Has been having dizziness at times. She just feels and knows something is not right.  Started the Cymbalta Monday night and hasn't been able to take it since then she has a neurologist she sees in [place]. She also states she has been to her ENT doctor. She also has a dizziness funny sensation in her head. She denies any focal weakness of her arms or legs but feels weak all over.[name]   PCP Dr. Beverely Low  Review of Systems     Objective:   Physical Exam HEENT exam reveals a frail female who is not in any distress. Throat is normal. There is no thyromegaly. Chest was clear to auscultation and percussion. There is a scar present over the posterior C-spine heart exam reveals a 2/6 short systolic murmur at the lower left sternal border abdomen is soft nontender. Neuro is without focal signs. Deep tendon  reflexes are symmetrical motor sensory exam is normal as are the cranial nerves.  EKG normal Results for orders placed in visit on 12/25/12  POCT CBC      Result Value Range   WBC 7.0  4.6 - 10.2 K/uL   Lymph, poc 1.1  0.6 - 3.4   POC LYMPH PERCENT 15.7  10 - 50 %L   MID (cbc) 0.4  0 - 0.9   POC MID % 5.2  0 - 12 %M   POC Granulocyte 5.5  2 - 6.9   Granulocyte percent 79.1  37 - 80 %G   RBC 4.48  4.04 - 5.48 M/uL   Hemoglobin 14.3  12.2 - 16.2 g/dL   HCT, POC 40.9  81.1 - 47.9 %   MCV 101.8 (*) 80 - 97 fL   MCH, POC 31.9 (*) 27 - 31.2 pg   MCHC 31.4 (*) 31.8 - 35.4 g/dL   RDW, POC 91.4     Platelet Count, POC 219  142 - 424 K/uL   MPV 7.9  0 - 99.8 fL       Assessment & Plan:  Patient does appear nervous and anxious. He has not responded well to SSRIs or the Cymbalta which has made her feel worse. She has Klonopin at home so we'll take 0.5 twice a day.  I done a cemented and a thyroid panel she's had some dizziness and feelings of fatigue we'll check a CT head

## 2012-12-26 NOTE — Telephone Encounter (Signed)
Please advise if ok to send new rx.

## 2012-12-26 NOTE — Telephone Encounter (Signed)
Okay to call in new prescription for kinase up and so she can take the medication twice a day

## 2012-12-27 MED ORDER — CLONAZEPAM 0.5 MG PO TABS: 0.5000 mg | ORAL_TABLET | Freq: Two times a day (BID) | ORAL | Status: DC | PRN

## 2012-12-27 NOTE — Telephone Encounter (Signed)
Called in.

## 2012-12-28 ENCOUNTER — Other Ambulatory Visit: Payer: Medicare Other

## 2012-12-28 ENCOUNTER — Ambulatory Visit
Admission: RE | Admit: 2012-12-28 | Discharge: 2012-12-28 | Disposition: A | Discharge status: Home / Self Care | Payer: Medicare Other | Source: Ambulatory Visit | Attending: Emergency Medicine | Admitting: Emergency Medicine

## 2012-12-28 ENCOUNTER — Other Ambulatory Visit: Payer: Self-pay | Admitting: Radiology

## 2012-12-28 DIAGNOSIS — R42 Dizziness and giddiness: Secondary | ICD-10-CM

## 2013-02-04 ENCOUNTER — Encounter: Payer: Medicare Other | Admitting: Family Medicine

## 2013-02-22 ENCOUNTER — Encounter: Payer: Self-pay | Admitting: Gynecology

## 2013-03-01 ENCOUNTER — Encounter: Payer: Self-pay | Admitting: Cardiology

## 2013-03-01 NOTE — Progress Notes (Unsigned)
Patient ID: [name], female   DOB: 07-07-1946, 67 y.o.   MRN: [id number]   Regina, [name]  Date of visit:  03/01/2013 DOB:  12-Nov-1945    Age:  67 yrs. Medical record number:  [id number]     Account number:  [id number] Primary Care Provider: Neena Rhymes ____________________________ CURRENT DIAGNOSES  1. Abnormal EKG  2. Palpitations  3. Panic Disorder  4. Mitral Valve-Prolapse  5. Arrhythmia-Atrial Fibrillation ____________________________ ALLERGIES  Cipro, Intolerance-unknown  Diltiazem, Intolerance-unknown  Duloxetine, Intolerance-unknown  Erythromycin Base, Intolerance-unknown  FOSAMAX, Intolerance-unknown  IVP Dye, Iodine Containing, Hives and/or rash  Latex, Intolerance-unknown  Metoprolol, Lethargy  Penicillins, Intolerance-unknown  Sulfa (Sulfonamides), Intolerance-unknown ____________________________ MEDICATIONS  1. gabapentin 100 mg Capsule, QHS  2. Vitamin D 1,000 unit Tablet, 1 p.o. daily  3. Vicodin 5-500 mg Tablet, PRN  4. vitamin B complex 100 #3-herbs 100 mg Tablet, 1 p.o. daily  5. aspirin 81 mg tablet, chewable, 1 p.o. daily  6. Prelief 65 mg tablet, PRN  7. acetaminophen 500 mg capsule, PRN  8. Tums 200 mg calcium (500 mg) tablet, chewable, PRN  9. metoprolol tartrate 25 mg tablet, PRN  10. lysine 500 mg tablet, 1 p.o. daily  11. ranitidine HCl 150 mg tablet, 1 p.o. daily  12. Vitamin B Complex tablet, 1 p.o. daily  13. Benadryl 25 mg capsule, 2 qhs  14. Advil 200 mg tablet, PRN ____________________________ CHIEF COMPLAINTS  Followup of Arrhythmia-Atrial Fibrillation ____________________________ HISTORY OF PRESENT ILLNESS  Patient seen for cardiac followup. Since she was last seen she has been doing quite well with very minimal palpitations. She has not had to use metoprolol. She is no longer having chest pain and is not really having dyspnea. Her major complaint is low back pain. She is considerably less anxious than she has been previously. She was  able to sell her home and is currently living with her son. 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 She is trying to get some exercise. She denies PND, orthopnea or edema. ____________________________ PAST HISTORY  Past Medical Illnesses:  depression, GERD, lumbar disc disease, osteoporosis, nephrolithiasis, history of pneumonia, panic attacks, anxiety, cervical disc disease, history of melanoma, interstitial cystitis;  Cardiovascular Illnesses:  history of mitral valve prolapse, atrial fibrillation;  Surgical Procedures:  hysterectomy, breast biopsy, melanoma rt foot, ant cervical neck fusion, bunion surgery, bony cyst forehead, posterior cervical fusion 10/09, bladder surgery, right rotator cuff surgery August 2012;  Cardiology Procedures-Invasive:  no previous interventional or invasive cardiology procedures;  Cardiology Procedures-Noninvasive:  treadmill cardiolite July 2008, event monitor February 2012, echocardiogram March 2012, treadmill Myoview March 2014;  LVEF of 82% documented via nuclear study on 09/09/2012,   ____________________________ CARDIO-PULMONARY TEST DATES EKG Date:  09/07/2012;  Holter/Event Monitor Date: 08/13/2010;  Nuclear Study Date:  09/09/2012;  Echocardiography Date: 09/18/2010;  Chest Xray Date: 08/13/2010;  CT Scan Date:  06/13/2004   ____________________________ FAMILY HISTORY Brother -- Diabetes mellitus Brother -- Diabetes mellitus Brother -- Brother alive and well Father -- Diabetes mellitus, Deceased Mother -- Cancer, Deceased Sister -- Sister alive and well Sister -- Sister alive and well ____________________________ SOCIAL HISTORY Alcohol Use:  does not use alcohol;  Smoking:  used to smoke but quit 1984;  Diet:  regular diet;  Lifestyle:  divorced and 1 son;  Exercise:  no regular exercise;  Occupation:  [employer];  Residence:   lives alone;   ____________________________ REVIEW OF SYSTEMS General:  fatigue Eyes: denies diplopia, history of glaucoma or visual problems. Ears, Nose, Throat, Mouth:  tinnitus Cardiovascular:  please review HPI Genitourinary-Female: nocturia Musculoskeletal:  chronic low back pain Neurological:  headaches, dizziness Psychiatric:  panic disorder  ____________________________ PHYSICAL EXAMINATION VITAL SIGNS  Blood Pressure:  124/70 Sitting, Right arm, regular cuff   Pulse:  82/min. Weight:  127.00 lbs. Height:  68"BMI: 19  Constitutional:  pleasant white female, in no acute distress Skin:  warm and dry to touch, no apparent skin lesions, or masses noted. Head:  normocephalic, normal hair pattern, no masses or tenderness ENT:  ears, nose and throat reveal no gross abnormalities.  Dentition good. Neck:  supple, no masses, thyromegaly, JVD. Carotid pulses are full and equal bilaterally without bruits. Chest:  normal symmetry, clear to auscultation and percussion. Cardiac:  regular rhythm, normal S1 and S2, No S3 or S4, no murmurs, gallops or rubs detected. Peripheral Pulses:  the femoral,dorsalis pedis, and posterior tibial pulses are full and equal bilaterally with no bruits auscultated. Neurological:  no gross motor or sensory deficits noted, affect appropriate, oriented x3. ____________________________ MOST RECENT LIPID PANEL 08/13/10  CHOL TOTL 202 mg/dl, LDL 98 NM, HDL 90 mg/dl, TRIGLYCER 68 mg/dl and CHOL/HDL 2.2 (Calc) ____________________________ IMPRESSIONS/PLAN  1. Mitral valve prolapse 2. Panic disorder 3. Significant improvement in chest pain since here  Recommendations:  She had a previous Myoview study in March that showed no significant ischemia. She is no longer having significant palpitations and her panic symptoms appear to be significantly improved. I will plan to see her again in one year but asked her to call us if she has recurrent  problems. ____________________________ TODAYS ORDERS  1. Return Visit: 1 year                       ____________________________ Cardiology Physician:  Darden Palmer MD [facility]

## 2013-03-24 ENCOUNTER — Encounter: Payer: Self-pay | Admitting: Family Medicine

## 2013-03-24 ENCOUNTER — Other Ambulatory Visit: Payer: Self-pay | Admitting: Dermatology

## 2013-03-24 ENCOUNTER — Ambulatory Visit (INDEPENDENT_AMBULATORY_CARE_PROVIDER_SITE_OTHER): Payer: Medicare Other | Admitting: Family Medicine

## 2013-03-24 VITALS — BP 120/80 | HR 94 | Temp 97.5°F | Wt 124.4 lb

## 2013-03-24 DIAGNOSIS — K219 Gastro-esophageal reflux disease without esophagitis: Secondary | ICD-10-CM

## 2013-03-24 MED ORDER — PANTOPRAZOLE SODIUM 40 MG PO TBEC: 40.0000 mg | DELAYED_RELEASE_TABLET | Freq: Every day | ORAL | Status: DC

## 2013-03-24 NOTE — Progress Notes (Signed)
  Subjective:    Patient ID: [name], female    DOB: 03/02/1946, 67 y.o.   MRN: [id number]  HPI 'off and on for at least 3 yrs but we've never talked about it'- has seen cardiology and had work up done (normal stress test).  Hx of Afib.  Pt reports she 'can feel it coming'  Pt has seen GI and had endoscopy w/ bx for suspected GERD.  Episodes last 2-5 minutes.  sxs improve w/ Tums/Antacids.  Pain is described as a 'cramp' rising in the center of the chest.  Will experience pain when rising out of recliner.  Currently on Ranitidine daily.   Review of Systems For ROS see HPI     Objective:   Physical Exam  Vitals reviewed. Constitutional: She is oriented to person, place, and time. She appears well-developed and well-nourished. No distress.  HENT:  Head: Normocephalic and atraumatic.  Eyes: Conjunctivae and EOM are normal. Pupils are equal, round, and reactive to light.  Neck: Normal range of motion. Neck supple. No thyromegaly present.  Cardiovascular: Normal rate, regular rhythm, normal heart sounds and intact distal pulses.   No murmur heard. Pulmonary/Chest: Effort normal and breath sounds normal. No respiratory distress.  Abdominal: Soft. She exhibits no distension. There is no tenderness.  Musculoskeletal: She exhibits no edema.  Lymphadenopathy:    She has no cervical adenopathy.  Neurological: She is alert and oriented to person, place, and time.  Skin: Skin is warm and dry.  Psychiatric: She has a normal mood and affect. Her behavior is normal.          Assessment & Plan:

## 2013-03-24 NOTE — Assessment & Plan Note (Signed)
Deteriorated.  Pt's sxs consistent w/ GERD and in setting of normal cardiac w/u are most likely cause of pt's chronic chest pain.  Stop H2 blocker in favor of stronger PPI.  Reviewed lifestyle modifications.  Will follow.

## 2013-03-24 NOTE — Patient Instructions (Addendum)
The symptoms you are describing are reflux/heartburn Start the Protonix daily to decrease acid Try and avoid eating prior to bed or lying down Call with any questions or concerns Hang in there!!!

## 2013-05-02 ENCOUNTER — Encounter: Payer: Medicare Other | Admitting: Gynecology

## 2013-05-09 LAB — HM MAMMOGRAPHY

## 2013-05-10 ENCOUNTER — Encounter: Payer: Self-pay | Admitting: Gynecology

## 2013-05-12 ENCOUNTER — Other Ambulatory Visit: Payer: Self-pay | Admitting: Family Medicine

## 2013-05-16 ENCOUNTER — Ambulatory Visit (INDEPENDENT_AMBULATORY_CARE_PROVIDER_SITE_OTHER): Payer: Medicare Other | Admitting: Gynecology

## 2013-05-16 ENCOUNTER — Encounter: Payer: Self-pay | Admitting: Gynecology

## 2013-05-16 VITALS — BP 114/70 | Ht 66.0 in | Wt 130.0 lb

## 2013-05-16 DIAGNOSIS — N94819 Vulvodynia, unspecified: Secondary | ICD-10-CM

## 2013-05-16 DIAGNOSIS — R29898 Other symptoms and signs involving the musculoskeletal system: Secondary | ICD-10-CM

## 2013-05-16 DIAGNOSIS — M8580 Other specified disorders of bone density and structure, unspecified site: Secondary | ICD-10-CM

## 2013-05-16 DIAGNOSIS — M899 Disorder of bone, unspecified: Secondary | ICD-10-CM

## 2013-05-16 DIAGNOSIS — Z1159 Encounter for screening for other viral diseases: Secondary | ICD-10-CM

## 2013-05-16 DIAGNOSIS — M6289 Other specified disorders of muscle: Secondary | ICD-10-CM

## 2013-05-16 DIAGNOSIS — B009 Herpesviral infection, unspecified: Secondary | ICD-10-CM

## 2013-05-16 DIAGNOSIS — M949 Disorder of cartilage, unspecified: Secondary | ICD-10-CM

## 2013-05-16 NOTE — Progress Notes (Addendum)
[name] 08-Nov-1945 [id number]   History:    67 y.o.  for annual gyn exam who's been complaining of tiredness and fatigue. Dr. Chanetta Marshall is her  PCP which she scheduled to see in the next few weeks. Patient does have history of osteopenia but normal Frax analysis. Last bone density study done in 2013. Patient with normal colonoscopy in 2010. 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 She states it has been over 20 years since she had an abnormal Pap smear. Over 20 years ago she had cryotherapy of her cervix. 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 Neurontin 100 mg daily has helped with per vulvodynia. Her last colonoscopy was normal in 2010. She has history of interstitial cystitis has been followed by Dr. Eudelia Bunch at [facility] which she sees yearly. She also sees Dr. Nicholas Lose dermatologist twice a year due to the fact the patient's had history in the past of melanoma. Patient had a normal mammogram in November 2014 but in 2012 she had stereotactic biopsy of the left breast with the following result:  HYALINIZED FIBROADENOMA WITH CALCIFICATIONS.  - THERE IS NO EVIDENCE OF MALIGNANCY.  Patient does her breast exams every month.  Past medical history,surgical history, family history and social history were all reviewed and documented in the EPIC chart.  Gynecologic History No LMP recorded. Patient has had a hysterectomy. Contraception: status post hysterectomy Last Pap: 2012. Results were: normal Last mammogram: 2014. Results were: normal  Obstetric History OB History  Gravida Para Term Preterm AB SAB TAB Ectopic Multiple Living  1 1 1       1     # Outcome Date GA Lbr Len/2nd Weight Sex Delivery Anes PTL Lv  1 TRM     M SVD  N Y       ROS: A ROS was performed and pertinent positives and negatives are included in the history.  GENERAL: No fevers or chills. HEENT: No change in vision, no earache, sore throat or sinus  congestion. NECK: No pain or stiffness. CARDIOVASCULAR: No chest pain or pressure. No palpitations. PULMONARY: No shortness of breath, cough or wheeze. GASTROINTESTINAL: No abdominal pain, nausea, vomiting or diarrhea, melena or bright red blood per rectum. GENITOURINARY: No urinary frequency, urgency, hesitancy or dysuria. MUSCULOSKELETAL: No joint or muscle pain, no back pain, no recent trauma. DERMATOLOGIC: No rash, no itching, no lesions. ENDOCRINE: No polyuria, polydipsia, no heat or cold intolerance. No recent change in weight. HEMATOLOGICAL: No anemia or easy bruising or bleeding. NEUROLOGIC: No headache, seizures, numbness, tingling or weakness. PSYCHIATRIC: No depression, no loss of interest in normal activity or change in sleep pattern.     Exam: chaperone present  BP 114/70  Ht 5\' 6"  (1.676 m)  Wt 130 lb (58.968 kg)  BMI 20.99 kg/m2  Body mass index is 20.99 kg/(m^2).  General appearance : Well developed well nourished female. No acute distress HEENT: Neck supple, trachea midline, no carotid bruits, no thyroidmegaly Lungs: Clear to auscultation, no rhonchi or wheezes, or rib retractions  Heart: Regular rate and rhythm, no murmurs or gallops Breast:Examined in sitting and supine position were symmetrical in appearance, no palpable masses or tenderness,  no skin retraction, no nipple inversion, no nipple discharge, no skin discoloration, no axillary or supraclavicular lymphadenopathy Abdomen: no palpable masses or tenderness, no rebound or guarding Extremities: no edema or skin discoloration or tenderness  Pelvic:  Bartholin, Urethra, Skene Glands:  Within normal limits             Vagina: No gross lesions or discharge  Cervix: absence  Uterus  Absent,  Adnexa  Without masses or tenderness  Anus and perineum  normal   Rectovaginal  normal sphincter tone without palpated masses or tenderness             Hemoccult PCP provides     Assessment/Plan:  67 y.o. 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 She is complaining of coronary Similac of energy perhaps may be attributed to the Neurontin. I have asked her to decrease it from 100 mg to 50 mg daily and monitor symptoms. Her PCP will be drawing  her lab work in the next few weeks but we will check her vitamin D level today. She is taking her calcium and vitamin D twice a day. Patient's flu vaccine is up to date. We will also check her hepatitis C:  New CDC guidelines is recommending patients be tested once in her lifetime for hepatitis C antibody who were born between [birth year] through 1965. This was discussed with the patient today and has agreed to be tested today.  Pap smear not done today in accordance to the new guidelines  . Note: This dictation was prepared with  Dragon/digital dictation along withSmart phrase technology. Any transcriptional errors that result from this process are unintentional.   Ok Edwards MD, 3:06 PM 05/16/2013

## 2013-05-17 ENCOUNTER — Encounter: Payer: Self-pay | Admitting: Family Medicine

## 2013-05-17 ENCOUNTER — Telehealth: Payer: Self-pay | Admitting: *Deleted

## 2013-05-17 LAB — VITAMIN D 25 HYDROXY (VIT D DEFICIENCY, FRACTURES): Vit D, 25-Hydroxy: 43 ng/mL (ref 30–89)

## 2013-05-17 LAB — HEPATITIS C ANTIBODY: HCV Ab: NEGATIVE

## 2013-05-17 NOTE — Telephone Encounter (Signed)
Pt had blood drawn at [facility] on 05/16/13 noticed small hematoma at site, pt will use some ice compression to relieve the swelling, pt will call back if this does not resolve swelling.

## 2013-05-19 ENCOUNTER — Ambulatory Visit: Payer: Medicare Other | Admitting: Family Medicine

## 2013-05-20 ENCOUNTER — Telehealth: Payer: Self-pay | Admitting: *Deleted

## 2013-05-20 NOTE — Telephone Encounter (Signed)
Pt informed with lab results 05/16/13.

## 2013-06-06 ENCOUNTER — Telehealth: Payer: Self-pay | Admitting: *Deleted

## 2013-06-06 NOTE — Telephone Encounter (Signed)
If she is having any form of vasomotor symptoms along with her ability or mood swing a low dose estrogen patch to help with these symptoms may be worth a trial. As long as she continues to do her monthly breast exams and her mammograms are up-to-date. I would like her to come to the office so we can discuss different regimens and I can give her some samples to help her get started as well. We need to discuss the potential risk although small of breast cancer.

## 2013-06-06 NOTE — Telephone Encounter (Signed)
Pt was seen at annual on 05/16/13 mention to you about feeling fatigue, pt asked if starting on estrogen patch would help with that? Pt said about 2 weeks out of every month she feels PMS symptoms, no bleeding, tired and irritable. If estrogen patch would be option, pt would like Rx. Please advise

## 2013-06-07 ENCOUNTER — Telehealth: Payer: Self-pay

## 2013-06-07 NOTE — Telephone Encounter (Signed)
Pt informed with the below note, pt will make appointment.

## 2013-06-07 NOTE — Telephone Encounter (Signed)
Medication and allergies: reviewed and updated  90 day supply/mail order: na Local pharmacy: [facility]   Immunizations due:  UTD  A/P:   No changes to FH or PSH Gyn--Dr Fernandez--2014 Bone Density--04/2012--osteopenia CCS--02/2009--next due 2020 MMG--05/2013--neg--[facility]  Flu vaccine--04/2013 Tdap--07/2009 PNA--08/2009 Shingles vaccine--2012  To Discuss with Provider: Reflux Swelling in ankles especially right A-Fib question

## 2013-06-08 ENCOUNTER — Ambulatory Visit (INDEPENDENT_AMBULATORY_CARE_PROVIDER_SITE_OTHER): Payer: Medicare Other | Admitting: Family Medicine

## 2013-06-08 ENCOUNTER — Encounter: Payer: Self-pay | Admitting: Family Medicine

## 2013-06-08 VITALS — BP 120/82 | HR 83 | Temp 98.4°F | Resp 16 | Ht 66.5 in | Wt 131.4 lb

## 2013-06-08 DIAGNOSIS — K219 Gastro-esophageal reflux disease without esophagitis: Secondary | ICD-10-CM

## 2013-06-08 DIAGNOSIS — H8110 Benign paroxysmal vertigo, unspecified ear: Secondary | ICD-10-CM

## 2013-06-08 DIAGNOSIS — M8580 Other specified disorders of bone density and structure, unspecified site: Secondary | ICD-10-CM

## 2013-06-08 DIAGNOSIS — M899 Disorder of bone, unspecified: Secondary | ICD-10-CM

## 2013-06-08 DIAGNOSIS — M949 Disorder of cartilage, unspecified: Secondary | ICD-10-CM

## 2013-06-08 DIAGNOSIS — E785 Hyperlipidemia, unspecified: Secondary | ICD-10-CM | POA: Insufficient documentation

## 2013-06-08 DIAGNOSIS — Z0000 Encounter for general adult medical examination without abnormal findings: Secondary | ICD-10-CM

## 2013-06-08 HISTORY — DX: Benign paroxysmal vertigo, unspecified ear: H81.10

## 2013-06-08 LAB — HEPATIC FUNCTION PANEL
ALT: 27 U/L (ref 0–35)
AST: 28 U/L (ref 0–37)
Albumin: 4 g/dL (ref 3.5–5.2)
Alkaline Phosphatase: 59 U/L (ref 39–117)
Bilirubin, Direct: 0 mg/dL (ref 0.0–0.3)
Total Bilirubin: 0.6 mg/dL (ref 0.3–1.2)
Total Protein: 7 g/dL (ref 6.0–8.3)

## 2013-06-08 LAB — LIPID PANEL
Cholesterol: 238 mg/dL — ABNORMAL HIGH (ref 0–200)
HDL: 88.9 mg/dL (ref >39.00)
Total CHOL/HDL Ratio: 3
Triglycerides: 82 mg/dL (ref 0.0–149.0)
VLDL: 16.4 mg/dL (ref 0.0–40.0)

## 2013-06-08 LAB — CBC WITH DIFFERENTIAL/PLATELET
Basophils Absolute: 0 10*3/uL (ref 0.0–0.1)
Basophils Relative: 0.3 % (ref 0.0–3.0)
Eosinophils Absolute: 0.1 10*3/uL (ref 0.0–0.7)
Eosinophils Relative: 1.4 % (ref 0.0–5.0)
HCT: 39.5 % (ref 36.0–46.0)
Hemoglobin: 13.3 g/dL (ref 12.0–15.0)
Lymphocytes Relative: 20.1 % (ref 12.0–46.0)
Lymphs Abs: 1.2 10*3/uL (ref 0.7–4.0)
MCHC: 33.6 g/dL (ref 30.0–36.0)
MCV: 93.7 fl (ref 78.0–100.0)
Monocytes Absolute: 0.4 10*3/uL (ref 0.1–1.0)
Monocytes Relative: 7.4 % (ref 3.0–12.0)
Neutro Abs: 4.1 10*3/uL (ref 1.4–7.7)
Neutrophils Relative %: 70.8 % (ref 43.0–77.0)
Platelets: 241 10*3/uL (ref 150.0–400.0)
RBC: 4.22 Mil/uL (ref 3.87–5.11)
RDW: 12.9 % (ref 11.5–14.6)
WBC: 5.8 10*3/uL (ref 4.5–10.5)

## 2013-06-08 LAB — TSH: TSH: 2.21 u[IU]/mL (ref 0.35–5.50)

## 2013-06-08 LAB — LDL CHOLESTEROL, DIRECT: Direct LDL: 136.5 mg/dL

## 2013-06-08 MED ORDER — MECLIZINE HCL 25 MG PO TABS: 25.0000 mg | ORAL_TABLET | Freq: Three times a day (TID) | ORAL | Status: DC | PRN

## 2013-06-08 NOTE — Patient Instructions (Addendum)
Follow up in 1 year or as needed We'll notify you of your lab results and make any changes if needed Take the Meclizine as needed for dizziness Do the Epley Maneuver to desensitize your inner ear canals The swelling in your ankle and foot is due to a combination of age, previous surgery, and being on your feet Call with any questions or concerns Happy Holidays!!!

## 2013-06-08 NOTE — Progress Notes (Signed)
   Subjective:    Patient ID: [name], female    DOB: 24-Feb-1946, 67 y.o.   MRN: [id number]  HPI Pre visit review using our clinic review tool, if applicable. No additional management support is needed unless otherwise documented below in the visit note.  Here today for CPE.  Risk Factors: GERD- chronic problem, on protonix.  sxs have improved.  Pt is concerned about taking med long term. Osteopenia- chronic problem, on Ca and Vit D.  UTD on DEXA.   Vertigo- dizziness w/ turning head, intermittent x4 months Physical Activity: used to walk regularly, no recent formal activity Fall Risk: low Depression: no current sxs, hx of anxiety Hearing: normal to conversational tones, decreased to whispered voice ADL's: independent Cognitive: normal linear thought process, memory and attention intact Home Safety: pt feels safe at home, lives w/ son Height, Weight, BMI, Visual Acuity: see vitals, vision corrected to 20/20 w/ glasses Counseling: UTD on colonoscopy, mammo, DEXA, pap. Labs Ordered: See A&P Care Plan: See A&P   Review of Systems Patient reports no vision/ hearing changes, adenopathy, fever, weight change,  persistant/recurrent hoarseness , swallowing issues, chest pain, palpitations, persistant/recurrent cough, hemoptysis, dyspnea (rest/exertional/paroxysmal nocturnal), gastrointestinal bleeding (melena, rectal bleeding), abdominal pain, significant heartburn, bowel changes, GU symptoms (dysuria, hematuria, incontinence), Gyn symptoms (abnormal  bleeding, pain),  syncope, focal weakness, memory loss, numbness & tingling, skin/hair/nail changes, abnormal bruising or bleeding, or depression.  Edema- R foot at site of prior melanoma     Objective:   Physical Exam General Appearance:    Alert, cooperative, no distress, appears stated age  Head:    Normocephalic, without obvious abnormality, atraumatic  Eyes:    PERRL, conjunctiva/corneas clear, EOM's intact, fundi    benign, both  eyes  Ears:    Normal TM's and external ear canals, both ears  Nose:   Nares normal, septum midline, mucosa normal, no drainage    or sinus tenderness  Throat:   Lips, mucosa, and tongue normal; teeth and gums normal  Neck:   Supple, symmetrical, trachea midline, no adenopathy;    Thyroid: no enlargement/tenderness/nodules  Back:     Symmetric, no curvature, ROM normal, no CVA tenderness  Lungs:     Clear to auscultation bilaterally, respirations unlabored  Chest Wall:    No tenderness or deformity   Heart:    Regular rate and rhythm, S1 and S2 normal, no murmur, rub   or gallop  Breast Exam:    Deferred to GYN  Abdomen:     Soft, non-tender, bowel sounds active all four quadrants,    no masses, no organomegaly  Genitalia:    Deferred to GYN  Rectal:    Extremities:   Extremities normal, atraumatic, no cyanosis, mild R lateral foot edema at site of previous surgery  Pulses:   2+ and symmetric all extremities  Skin:   Skin color, texture, turgor normal, no rashes or lesions  Lymph nodes:   Cervical, supraclavicular, and axillary nodes normal  Neurologic:   CNII-XII intact, normal strength, sensation and reflexes    throughout          Assessment & Plan:

## 2013-06-09 ENCOUNTER — Encounter: Payer: Self-pay | Admitting: General Practice

## 2013-06-09 LAB — BASIC METABOLIC PANEL
BUN: 12 mg/dL (ref 6–23)
CO2: 27 mEq/L (ref 19–32)
Calcium: 9.1 mg/dL (ref 8.4–10.5)
Chloride: 106 mEq/L (ref 96–112)
Creatinine, Ser: 0.6 mg/dL (ref 0.4–1.2)
GFR: 109.6 mL/min (ref >60.00)
Glucose, Bld: 84 mg/dL (ref 70–99)
Potassium: 3.7 mEq/L (ref 3.5–5.1)
Sodium: 142 mEq/L (ref 135–145)

## 2013-06-10 ENCOUNTER — Telehealth: Payer: Self-pay | Admitting: Family Medicine

## 2013-06-10 NOTE — Telephone Encounter (Signed)
Patient is calling with questions about her lab results she received in the mail. Please advise.

## 2013-06-10 NOTE — Telephone Encounter (Signed)
Discussed labs with pt. Pt stated an understanding.

## 2013-06-12 LAB — VITAMIN D 1,25 DIHYDROXY
Vitamin D 1, 25 (OH)2 Total: 44 pg/mL (ref 18–72)
Vitamin D2 1, 25 (OH)2: <8 pg/mL
Vitamin D3 1, 25 (OH)2: 44 pg/mL

## 2013-06-12 NOTE — Assessment & Plan Note (Signed)
New.  Pt's sxs consistent w/ BPPV.  Start meclizine prn.  Handout on modified [place] provided.  Reviewed supportive care and red flags that should prompt return.  Pt expressed understanding and is in agreement w/ plan.

## 2013-06-12 NOTE — Assessment & Plan Note (Signed)
Chronic problem.  Well controlled on Protonix.  Reassurance provided on safety of meds.

## 2013-06-12 NOTE — Assessment & Plan Note (Signed)
Chronic problem.  Attempting to control w/ diet and exercise.  Check labs.  Start meds prn. 

## 2013-06-12 NOTE — Assessment & Plan Note (Signed)
Chronic problem.  UTD on DEXA.  Check Vit D.  Continue Ca + Vit D

## 2013-06-12 NOTE — Assessment & Plan Note (Signed)
Pt's PE WNL.  UTD on health maintenance.  Check labs.  Anticipatory guidance provided.  

## 2013-06-13 ENCOUNTER — Encounter: Payer: Self-pay | Admitting: *Deleted

## 2013-07-02 ENCOUNTER — Ambulatory Visit (INDEPENDENT_AMBULATORY_CARE_PROVIDER_SITE_OTHER): Payer: Medicare Other | Admitting: Emergency Medicine

## 2013-07-02 VITALS — BP 130/64 | HR 73 | Temp 97.8°F | Resp 16 | Ht 67.0 in | Wt 133.0 lb

## 2013-07-02 DIAGNOSIS — R300 Dysuria: Secondary | ICD-10-CM

## 2013-07-02 DIAGNOSIS — N301 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: Secondary | ICD-10-CM

## 2013-07-02 LAB — POCT UA - MICROSCOPIC ONLY
Casts, Ur, LPF, POC: NEGATIVE
Crystals, Ur, HPF, POC: NEGATIVE
Mucus, UA: NEGATIVE
Yeast, UA: NEGATIVE

## 2013-07-02 LAB — POCT URINALYSIS DIPSTICK
Bilirubin, UA: NEGATIVE
Glucose, UA: NEGATIVE
Ketones, UA: NEGATIVE
Nitrite, UA: NEGATIVE
Protein, UA: NEGATIVE
Spec Grav, UA: <=1.005
Urobilinogen, UA: 0.2
pH, UA: 5.5

## 2013-07-02 NOTE — Progress Notes (Signed)
Subjective:   Patient ID: [name], female    DOB: 05-28-1946, 67 y.o.   MRN: [id number]  HPI  This chart was scribed for Collene Gobble, MD, by Ellin Mayhew, ED Scribe. This patient was seen in room 08 and the patient's care was started at 10:58 AM.  HPI Comments: [name] is a 67 y.o. female who presents to the [facility] complaining of a UTI that began during the first week of Decemeber. She reports taking two different antibiotics for this; Erythromycin followed by Keflex with no relief. During her second flare up, her provider indicated a possibility of an infection due to E Coli and prescribed Keflex. She finished her last dose three days ago; however, reports no improvement. The patient reports having initially feeling better prior to starting the antibiotics, and feeling worse with the taking of medication. She denies any fever or pain. Patient has a history of intermittent claudication and PMDD.   Her gynecologist is Dr. Hyman Hopes.  Past Medical History  Diagnosis Date  . Vaginal delivery     ONE NSVD  . Vulvodynia   . PMDD (premenstrual dysphoric disorder)   . IC (intermittent claudication)   . Atrial fibrillation   . Melanoma of ankle   . Rotator cuff tear   . Melanoma   . Kidney stone   . Heart disease     noted A Fib once  . GERD (gastroesophageal reflux disease)   . Herpes   . Allergy   . Anxiety     Past Surgical History  Procedure Laterality Date  . Vaginal hysterectomy      TVH  . Back surgery      DISC FUSION L5,L6,L7  . Bunionectomy    . Head & neck skin lesion excisional biopsy      melanoma  . Bladder surgery    . Rotator cuff repair  2012    RIGHT  . Breast surgery      BREAST BIOPSY--RIGHT BENIGN    Family History  Problem Relation Age of Onset  . Diabetes Father   . Hyperlipidemia Sister   . Heart disease Sister   . Stroke Sister   . Diabetes Brother   . Hyperlipidemia Sister   . Heart disease Sister   . Arthritis  Mother   . Cancer Mother     uterus  . Heart disease Mother   . Diabetes Brother   . Heart Problems Brother     History   Social History  . Marital Status: Divorced    Spouse Name: N/A    Number of Children: N/A  . Years of Education: N/A   Occupational History  . Not on file.   Social History Main Topics  . Smoking status: Former Smoker -- 0.25 packs/day for 14 years    Types: Cigarettes  . Smokeless tobacco: Not on file  . Alcohol Use: No  . Drug Use: No  . Sexual Activity: Not on file   Other Topics Concern  . Not on file   Social History Narrative  . No narrative on file    Allergies  Allergen Reactions  . Contrast Media [Iodinated Diagnostic Agents]   . Erythromycin   . Latex   . Levofloxacin Nausea And Vomiting  . Other     IV CONTRAST /"DYE".  . Septra [Bactrim]   . Sulfa Antibiotics     Patient Active Problem List   Diagnosis Date Noted  . Routine  general medical examination at a health care facility 06/08/2013  . Other and unspecified hyperlipidemia 06/08/2013  . Benign paroxysmal positional vertigo 06/08/2013  . Sinusitis 05/19/2012  . Lumbar back pain 05/19/2012  . Hair loss 04/12/2012  . Recurrent HSV (herpes simplex virus) 02/18/2012  . 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 Anxiety 02/18/2012  . Osteopenia 02/18/2012  . Ureteral stone 02/18/2012  . Bilateral leg pain 11/17/2011  . Nocturnal leg cramps 11/06/2011  . Seasonal allergic rhinitis 11/06/2011  . Peripheral neuropathy 11/06/2011  . S/P cervical spinal fusion 11/06/2011  . GERD (gastroesophageal reflux disease) 11/06/2011  . Interstitial cystitis 11/06/2011  . Melanoma 11/06/2011  . History of atrial fibrillation 11/06/2011    Results for orders placed in visit on 07/02/13  POCT UA - MICROSCOPIC ONLY      Result Value Range   WBC, Ur, HPF, POC 2-5     RBC, urine, microscopic 0-2     Bacteria, U Microscopic trace     Mucus, UA neg     Epithelial cells, urine per micros 1-3      Crystals, Ur, HPF, POC neg     Casts, Ur, LPF, POC neg     Yeast, UA neg    POCT URINALYSIS DIPSTICK      Result Value Range   Color, UA yellow     Clarity, UA clear     Glucose, UA neg     Bilirubin, UA neg     Ketones, UA neg     Spec Grav, UA <=1.005     Blood, UA trace     pH, UA 5.5     Protein, UA neg     Urobilinogen, UA 0.2     Nitrite, UA neg     Leukocytes, UA small (1+)      1. Dysuria    No orders of the defined types were placed in this encounter.    BP 130/64  Pulse 73  Temp(Src) 97.8 F (36.6 C) (Oral)  Resp 16  Ht 5\' 7"  (1.702 m)  Wt 133 lb (60.328 kg)  BMI 20.83 kg/m2  SpO2 99%  Review of Systems  Constitutional: Negative for fever and chills.  HENT: Negative for congestion and sore throat.   Respiratory: Negative for cough and shortness of breath.   Cardiovascular: Negative for chest pain and palpitations.  Gastrointestinal: Negative for nausea and vomiting.  Genitourinary: Positive for dysuria.       UTI  Neurological: Negative for weakness.   A complete 10 system review of systems was obtained and all systems are negative except as noted in the HPI and PMH.   Objective:  Physical Exam  CONSTITUTIONAL: Well developed/well nourished HEAD: Normocephalic/atraumatic EYES: EOMI/PERRL ENMT: Mucous membranes moist NECK: supple no meningeal signs SPINE:entire spine nontender CV: S1/S2 noted, no murmurs/rubs/gallops noted LUNGS: Lungs are clear to auscultation bilaterally, no apparent distress ABDOMEN: Pressure felt upon palpation of abdomen. soft, nontender, no rebound or guarding GU deferred.: NEURO: Pt is awake/alert, moves all extremitiesx4 EXTREMITIES: pulses normal, full ROM SKIN: warm, color normal PSYCH: no abnormalities of mood noted Results for orders placed in visit on 07/02/13  POCT UA - MICROSCOPIC ONLY      Result Value Range   WBC, Ur, HPF, POC 2-5     RBC, urine, microscopic 0-2     Bacteria, U Microscopic trace     Mucus,  UA neg     Epithelial cells, urine per micros 1-3     Crystals, Ur, HPF, POC  neg     Casts, Ur, LPF, POC neg     Yeast, UA neg    POCT URINALYSIS DIPSTICK      Result Value Range   Color, UA yellow     Clarity, UA clear     Glucose, UA neg     Bilirubin, UA neg     Ketones, UA neg     Spec Grav, UA <=1.005     Blood, UA trace     pH, UA 5.5     Protein, UA neg     Urobilinogen, UA 0.2     Nitrite, UA neg     Leukocytes, UA small (1+)     Assessment & Plan:   Patient has rare whites and reds in her urine. Her last culture was negative. I did repeat a culture today. I did not put her on antibiotics. I advised her to call her urologist Dr. Logan Bores who is at [place] who is the doctor in charge of treating her interstitial cystitis I personally performed the services described in this documentation, which was scribed in my presence. The recorded information has been reviewed and is accurate.

## 2013-07-03 LAB — URINE CULTURE
Colony Count: NO GROWTH
Organism ID, Bacteria: NO GROWTH

## 2013-07-07 DIAGNOSIS — S2249XA Multiple fractures of ribs, unspecified side, initial encounter for closed fracture: Secondary | ICD-10-CM

## 2013-07-07 HISTORY — DX: Multiple fractures of ribs, unspecified side, initial encounter for closed fracture: S22.49XA

## 2013-07-09 DIAGNOSIS — J069 Acute upper respiratory infection, unspecified: Secondary | ICD-10-CM | POA: Diagnosis not present

## 2013-07-09 DIAGNOSIS — R6889 Other general symptoms and signs: Secondary | ICD-10-CM | POA: Diagnosis not present

## 2013-07-13 ENCOUNTER — Ambulatory Visit: Payer: Medicare Other | Admitting: Gynecology

## 2013-07-14 DIAGNOSIS — H43819 Vitreous degeneration, unspecified eye: Secondary | ICD-10-CM | POA: Diagnosis not present

## 2013-07-14 DIAGNOSIS — H35379 Puckering of macula, unspecified eye: Secondary | ICD-10-CM | POA: Diagnosis not present

## 2013-07-14 DIAGNOSIS — H2510 Age-related nuclear cataract, unspecified eye: Secondary | ICD-10-CM | POA: Diagnosis not present

## 2013-07-20 ENCOUNTER — Telehealth: Payer: Self-pay | Admitting: Family Medicine

## 2013-07-20 MED ORDER — PANTOPRAZOLE SODIUM 40 MG PO TBEC: 40.0000 mg | DELAYED_RELEASE_TABLET | Freq: Every day | ORAL | Status: DC

## 2013-07-20 NOTE — Telephone Encounter (Signed)
Med filled.  

## 2013-07-20 NOTE — Telephone Encounter (Signed)
Patient is calling to have her pantoprazole (PROTONIX) 40 MG rx switched to [facility] in [place] on [address]. Please advise.

## 2013-07-22 DIAGNOSIS — J310 Chronic rhinitis: Secondary | ICD-10-CM | POA: Diagnosis not present

## 2013-07-22 DIAGNOSIS — R040 Epistaxis: Secondary | ICD-10-CM | POA: Diagnosis not present

## 2013-07-27 DIAGNOSIS — R5383 Other fatigue: Secondary | ICD-10-CM | POA: Diagnosis not present

## 2013-07-27 DIAGNOSIS — R5381 Other malaise: Secondary | ICD-10-CM | POA: Diagnosis not present

## 2013-07-27 DIAGNOSIS — R82998 Other abnormal findings in urine: Secondary | ICD-10-CM | POA: Diagnosis not present

## 2013-07-27 DIAGNOSIS — R0602 Shortness of breath: Secondary | ICD-10-CM | POA: Diagnosis not present

## 2013-07-27 DIAGNOSIS — J069 Acute upper respiratory infection, unspecified: Secondary | ICD-10-CM | POA: Diagnosis not present

## 2013-07-28 ENCOUNTER — Ambulatory Visit: Payer: Medicare Other | Admitting: Family

## 2013-07-28 ENCOUNTER — Ambulatory Visit: Payer: Medicare Other | Admitting: Family Medicine

## 2013-07-28 DIAGNOSIS — M79609 Pain in unspecified limb: Secondary | ICD-10-CM | POA: Diagnosis not present

## 2013-07-31 DIAGNOSIS — J019 Acute sinusitis, unspecified: Secondary | ICD-10-CM | POA: Diagnosis not present

## 2013-08-01 ENCOUNTER — Ambulatory Visit (INDEPENDENT_AMBULATORY_CARE_PROVIDER_SITE_OTHER): Payer: Medicare Other | Admitting: Family Medicine

## 2013-08-01 ENCOUNTER — Encounter: Payer: Self-pay | Admitting: Family Medicine

## 2013-08-01 VITALS — BP 110/74 | HR 93 | Temp 97.6°F | Resp 15 | Wt 130.5 lb

## 2013-08-01 DIAGNOSIS — J329 Chronic sinusitis, unspecified: Secondary | ICD-10-CM

## 2013-08-01 NOTE — Assessment & Plan Note (Signed)
Recurrent issue for pt.  She has seen multiple MDs- including ENT specialists for similar sxs in the past.  Reviewed 3 separate UC notes and their tx plans.  Pt just started Doxycycline- her 3rd abx.  Encouraged her to proceed w/ tx plan as outlined and if no improvement to call ENT for evaluation.  Add Coricidin HBP for congestion.  Will follow.

## 2013-08-01 NOTE — Patient Instructions (Addendum)
Follow up as needed Continue the Doxycycline as directed- take w/ food Drink plenty of fluids Mucinex DM to thin your congestion and suppress your cough Coricidin HBP for your congestion REST! If no improvement, please call your ENT Call with any questions or concerns Hang in there!  We've been seeing a lot of this!

## 2013-08-01 NOTE — Progress Notes (Signed)
Pre visit review using our clinic review tool, if applicable. No additional management support is needed unless otherwise documented below in the visit note. 

## 2013-08-01 NOTE — Progress Notes (Signed)
   Subjective:    Patient ID: [name], female    DOB: 1945/12/17, 68 y.o.   MRN: [id number]  HPI URI- sxs started earlier this month and has been seen at [facility] x3.  1st seen 1/03, then 1/21, and then yesterday 1/25.  Pt was initially seen and started on Ceftin, then Zpack, now yesterday, started Doxy.  Also on Flonase, cough syrup.  Has had normal flu test and CXR.  Pt reports 'i can't breathe and I have this glob down the back of my throat'.  Taking Mucinex daily, claritin.  Pt has not started nasal steroid b/c she fears nose bleeds.  Using vaporizer nightly.   Review of Systems For ROS see HPI     Objective:   Physical Exam  Vitals reviewed. Constitutional: She appears well-developed and well-nourished. No distress.  HENT:  Head: Normocephalic and atraumatic.  Right Ear: Tympanic membrane normal.  Left Ear: Tympanic membrane normal.  Nose: Mucosal edema and rhinorrhea present. Right sinus exhibits maxillary sinus tenderness and frontal sinus tenderness. Left sinus exhibits maxillary sinus tenderness and frontal sinus tenderness.  Mouth/Throat: Uvula is midline and mucous membranes are normal. Posterior oropharyngeal erythema present. No oropharyngeal exudate.  Eyes: Conjunctivae and EOM are normal. Pupils are equal, round, and reactive to light.  Neck: Normal range of motion. Neck supple.  Cardiovascular: Normal rate, regular rhythm and normal heart sounds.   Pulmonary/Chest: Effort normal and breath sounds normal. No respiratory distress. She has no wheezes.  Lymphadenopathy:    She has no cervical adenopathy.          Assessment & Plan:

## 2013-08-04 DIAGNOSIS — J329 Chronic sinusitis, unspecified: Secondary | ICD-10-CM | POA: Diagnosis not present

## 2013-08-04 DIAGNOSIS — J309 Allergic rhinitis, unspecified: Secondary | ICD-10-CM | POA: Diagnosis not present

## 2013-08-05 DIAGNOSIS — J329 Chronic sinusitis, unspecified: Secondary | ICD-10-CM | POA: Diagnosis not present

## 2013-08-05 DIAGNOSIS — J328 Other chronic sinusitis: Secondary | ICD-10-CM | POA: Diagnosis not present

## 2013-08-17 DIAGNOSIS — G5610 Other lesions of median nerve, unspecified upper limb: Secondary | ICD-10-CM | POA: Diagnosis not present

## 2013-08-17 DIAGNOSIS — M5412 Radiculopathy, cervical region: Secondary | ICD-10-CM | POA: Diagnosis not present

## 2013-08-17 DIAGNOSIS — G609 Hereditary and idiopathic neuropathy, unspecified: Secondary | ICD-10-CM | POA: Diagnosis not present

## 2013-08-17 DIAGNOSIS — IMO0002 Reserved for concepts with insufficient information to code with codable children: Secondary | ICD-10-CM | POA: Diagnosis not present

## 2013-08-17 DIAGNOSIS — M461 Sacroiliitis, not elsewhere classified: Secondary | ICD-10-CM | POA: Diagnosis not present

## 2013-08-17 DIAGNOSIS — G500 Trigeminal neuralgia: Secondary | ICD-10-CM | POA: Diagnosis not present

## 2013-08-22 DIAGNOSIS — Z79899 Other long term (current) drug therapy: Secondary | ICD-10-CM | POA: Diagnosis not present

## 2013-08-22 DIAGNOSIS — Z882 Allergy status to sulfonamides status: Secondary | ICD-10-CM | POA: Diagnosis not present

## 2013-08-22 DIAGNOSIS — F329 Major depressive disorder, single episode, unspecified: Secondary | ICD-10-CM | POA: Diagnosis not present

## 2013-08-22 DIAGNOSIS — Z883 Allergy status to other anti-infective agents status: Secondary | ICD-10-CM | POA: Diagnosis not present

## 2013-08-22 DIAGNOSIS — R071 Chest pain on breathing: Secondary | ICD-10-CM | POA: Diagnosis not present

## 2013-08-22 DIAGNOSIS — K219 Gastro-esophageal reflux disease without esophagitis: Secondary | ICD-10-CM | POA: Diagnosis not present

## 2013-08-22 DIAGNOSIS — F3289 Other specified depressive episodes: Secondary | ICD-10-CM | POA: Diagnosis not present

## 2013-08-22 DIAGNOSIS — F17200 Nicotine dependence, unspecified, uncomplicated: Secondary | ICD-10-CM | POA: Diagnosis not present

## 2013-08-22 DIAGNOSIS — R079 Chest pain, unspecified: Secondary | ICD-10-CM | POA: Diagnosis not present

## 2013-08-22 DIAGNOSIS — I4891 Unspecified atrial fibrillation: Secondary | ICD-10-CM | POA: Diagnosis not present

## 2013-08-22 DIAGNOSIS — M549 Dorsalgia, unspecified: Secondary | ICD-10-CM | POA: Diagnosis not present

## 2013-08-22 DIAGNOSIS — G589 Mononeuropathy, unspecified: Secondary | ICD-10-CM | POA: Diagnosis not present

## 2013-08-22 DIAGNOSIS — Z7982 Long term (current) use of aspirin: Secondary | ICD-10-CM | POA: Diagnosis not present

## 2013-08-22 DIAGNOSIS — Z91040 Latex allergy status: Secondary | ICD-10-CM | POA: Diagnosis not present

## 2013-09-06 DIAGNOSIS — R42 Dizziness and giddiness: Secondary | ICD-10-CM | POA: Diagnosis not present

## 2013-09-06 DIAGNOSIS — R55 Syncope and collapse: Secondary | ICD-10-CM | POA: Diagnosis not present

## 2013-09-13 DIAGNOSIS — H6120 Impacted cerumen, unspecified ear: Secondary | ICD-10-CM | POA: Diagnosis not present

## 2013-09-13 DIAGNOSIS — G8929 Other chronic pain: Secondary | ICD-10-CM | POA: Diagnosis not present

## 2013-09-13 DIAGNOSIS — M4120 Other idiopathic scoliosis, site unspecified: Secondary | ICD-10-CM | POA: Diagnosis not present

## 2013-09-13 DIAGNOSIS — R42 Dizziness and giddiness: Secondary | ICD-10-CM | POA: Diagnosis not present

## 2013-09-13 DIAGNOSIS — M25579 Pain in unspecified ankle and joints of unspecified foot: Secondary | ICD-10-CM | POA: Diagnosis not present

## 2013-09-19 DIAGNOSIS — M2180 Other specified acquired deformities of unspecified limb: Secondary | ICD-10-CM | POA: Diagnosis not present

## 2013-09-19 DIAGNOSIS — M25519 Pain in unspecified shoulder: Secondary | ICD-10-CM | POA: Diagnosis not present

## 2013-09-22 DIAGNOSIS — G609 Hereditary and idiopathic neuropathy, unspecified: Secondary | ICD-10-CM | POA: Diagnosis not present

## 2013-09-22 DIAGNOSIS — M5412 Radiculopathy, cervical region: Secondary | ICD-10-CM | POA: Diagnosis not present

## 2013-09-22 DIAGNOSIS — IMO0002 Reserved for concepts with insufficient information to code with codable children: Secondary | ICD-10-CM | POA: Diagnosis not present

## 2013-09-22 DIAGNOSIS — G5610 Other lesions of median nerve, unspecified upper limb: Secondary | ICD-10-CM | POA: Diagnosis not present

## 2013-09-27 DIAGNOSIS — M4120 Other idiopathic scoliosis, site unspecified: Secondary | ICD-10-CM | POA: Diagnosis not present

## 2013-10-04 DIAGNOSIS — G894 Chronic pain syndrome: Secondary | ICD-10-CM | POA: Diagnosis not present

## 2013-10-04 DIAGNOSIS — M25519 Pain in unspecified shoulder: Secondary | ICD-10-CM | POA: Diagnosis not present

## 2013-10-04 DIAGNOSIS — R42 Dizziness and giddiness: Secondary | ICD-10-CM | POA: Diagnosis not present

## 2013-10-04 DIAGNOSIS — M4120 Other idiopathic scoliosis, site unspecified: Secondary | ICD-10-CM | POA: Diagnosis not present

## 2013-10-05 DIAGNOSIS — D692 Other nonthrombocytopenic purpura: Secondary | ICD-10-CM | POA: Diagnosis not present

## 2013-10-05 DIAGNOSIS — D239 Other benign neoplasm of skin, unspecified: Secondary | ICD-10-CM | POA: Diagnosis not present

## 2013-10-05 DIAGNOSIS — Z85820 Personal history of malignant melanoma of skin: Secondary | ICD-10-CM | POA: Diagnosis not present

## 2013-10-05 DIAGNOSIS — D1801 Hemangioma of skin and subcutaneous tissue: Secondary | ICD-10-CM | POA: Diagnosis not present

## 2013-10-05 DIAGNOSIS — I789 Disease of capillaries, unspecified: Secondary | ICD-10-CM | POA: Diagnosis not present

## 2013-10-05 DIAGNOSIS — L819 Disorder of pigmentation, unspecified: Secondary | ICD-10-CM | POA: Diagnosis not present

## 2013-10-05 DIAGNOSIS — L820 Inflamed seborrheic keratosis: Secondary | ICD-10-CM | POA: Diagnosis not present

## 2013-10-05 DIAGNOSIS — L821 Other seborrheic keratosis: Secondary | ICD-10-CM | POA: Diagnosis not present

## 2013-10-18 DIAGNOSIS — M4120 Other idiopathic scoliosis, site unspecified: Secondary | ICD-10-CM | POA: Diagnosis not present

## 2013-10-20 DIAGNOSIS — M25519 Pain in unspecified shoulder: Secondary | ICD-10-CM | POA: Diagnosis not present

## 2013-10-20 DIAGNOSIS — IMO0001 Reserved for inherently not codable concepts without codable children: Secondary | ICD-10-CM | POA: Diagnosis not present

## 2013-10-25 DIAGNOSIS — M542 Cervicalgia: Secondary | ICD-10-CM | POA: Diagnosis not present

## 2013-10-25 DIAGNOSIS — R42 Dizziness and giddiness: Secondary | ICD-10-CM | POA: Diagnosis not present

## 2013-10-25 DIAGNOSIS — M4120 Other idiopathic scoliosis, site unspecified: Secondary | ICD-10-CM | POA: Diagnosis not present

## 2013-10-26 DIAGNOSIS — M25519 Pain in unspecified shoulder: Secondary | ICD-10-CM | POA: Diagnosis not present

## 2013-10-26 DIAGNOSIS — IMO0001 Reserved for inherently not codable concepts without codable children: Secondary | ICD-10-CM | POA: Diagnosis not present

## 2013-10-27 DIAGNOSIS — H903 Sensorineural hearing loss, bilateral: Secondary | ICD-10-CM | POA: Diagnosis not present

## 2013-10-27 DIAGNOSIS — J329 Chronic sinusitis, unspecified: Secondary | ICD-10-CM | POA: Diagnosis not present

## 2013-10-27 DIAGNOSIS — H6980 Other specified disorders of Eustachian tube, unspecified ear: Secondary | ICD-10-CM | POA: Diagnosis not present

## 2013-10-29 DIAGNOSIS — S59919A Unspecified injury of unspecified forearm, initial encounter: Secondary | ICD-10-CM | POA: Diagnosis not present

## 2013-10-29 DIAGNOSIS — S79919A Unspecified injury of unspecified hip, initial encounter: Secondary | ICD-10-CM | POA: Diagnosis not present

## 2013-10-29 DIAGNOSIS — IMO0002 Reserved for concepts with insufficient information to code with codable children: Secondary | ICD-10-CM | POA: Diagnosis not present

## 2013-10-29 DIAGNOSIS — S6990XA Unspecified injury of unspecified wrist, hand and finger(s), initial encounter: Secondary | ICD-10-CM | POA: Diagnosis not present

## 2013-10-29 DIAGNOSIS — S59909A Unspecified injury of unspecified elbow, initial encounter: Secondary | ICD-10-CM | POA: Diagnosis not present

## 2013-10-29 DIAGNOSIS — S79929A Unspecified injury of unspecified thigh, initial encounter: Secondary | ICD-10-CM | POA: Diagnosis not present

## 2013-11-01 DIAGNOSIS — M25519 Pain in unspecified shoulder: Secondary | ICD-10-CM | POA: Diagnosis not present

## 2013-11-01 DIAGNOSIS — IMO0001 Reserved for inherently not codable concepts without codable children: Secondary | ICD-10-CM | POA: Diagnosis not present

## 2013-11-03 DIAGNOSIS — R5381 Other malaise: Secondary | ICD-10-CM | POA: Diagnosis not present

## 2013-11-03 DIAGNOSIS — R5383 Other fatigue: Secondary | ICD-10-CM | POA: Diagnosis not present

## 2013-11-03 DIAGNOSIS — R42 Dizziness and giddiness: Secondary | ICD-10-CM | POA: Diagnosis not present

## 2013-11-03 DIAGNOSIS — IMO0001 Reserved for inherently not codable concepts without codable children: Secondary | ICD-10-CM | POA: Diagnosis not present

## 2013-11-03 DIAGNOSIS — M4120 Other idiopathic scoliosis, site unspecified: Secondary | ICD-10-CM | POA: Diagnosis not present

## 2013-11-07 DIAGNOSIS — IMO0001 Reserved for inherently not codable concepts without codable children: Secondary | ICD-10-CM | POA: Diagnosis not present

## 2013-11-07 DIAGNOSIS — M25519 Pain in unspecified shoulder: Secondary | ICD-10-CM | POA: Diagnosis not present

## 2013-11-11 DIAGNOSIS — IMO0001 Reserved for inherently not codable concepts without codable children: Secondary | ICD-10-CM | POA: Diagnosis not present

## 2013-11-11 DIAGNOSIS — M25519 Pain in unspecified shoulder: Secondary | ICD-10-CM | POA: Diagnosis not present

## 2013-11-14 DIAGNOSIS — M25519 Pain in unspecified shoulder: Secondary | ICD-10-CM | POA: Diagnosis not present

## 2013-11-14 DIAGNOSIS — IMO0001 Reserved for inherently not codable concepts without codable children: Secondary | ICD-10-CM | POA: Diagnosis not present

## 2013-11-16 DIAGNOSIS — M531 Cervicobrachial syndrome: Secondary | ICD-10-CM | POA: Diagnosis not present

## 2013-11-16 DIAGNOSIS — M5412 Radiculopathy, cervical region: Secondary | ICD-10-CM | POA: Diagnosis not present

## 2013-11-16 DIAGNOSIS — G5610 Other lesions of median nerve, unspecified upper limb: Secondary | ICD-10-CM | POA: Diagnosis not present

## 2013-11-16 DIAGNOSIS — IMO0002 Reserved for concepts with insufficient information to code with codable children: Secondary | ICD-10-CM | POA: Diagnosis not present

## 2013-11-17 DIAGNOSIS — Z87891 Personal history of nicotine dependence: Secondary | ICD-10-CM | POA: Diagnosis not present

## 2013-11-17 DIAGNOSIS — Z7982 Long term (current) use of aspirin: Secondary | ICD-10-CM | POA: Diagnosis not present

## 2013-11-17 DIAGNOSIS — Z889 Allergy status to unspecified drugs, medicaments and biological substances status: Secondary | ICD-10-CM | POA: Diagnosis not present

## 2013-11-17 DIAGNOSIS — Z883 Allergy status to other anti-infective agents status: Secondary | ICD-10-CM | POA: Diagnosis not present

## 2013-11-17 DIAGNOSIS — G609 Hereditary and idiopathic neuropathy, unspecified: Secondary | ICD-10-CM | POA: Diagnosis not present

## 2013-11-17 DIAGNOSIS — R29898 Other symptoms and signs involving the musculoskeletal system: Secondary | ICD-10-CM | POA: Diagnosis not present

## 2013-11-17 DIAGNOSIS — R42 Dizziness and giddiness: Secondary | ICD-10-CM | POA: Diagnosis not present

## 2013-11-17 DIAGNOSIS — I4891 Unspecified atrial fibrillation: Secondary | ICD-10-CM | POA: Diagnosis not present

## 2013-11-17 DIAGNOSIS — G589 Mononeuropathy, unspecified: Secondary | ICD-10-CM | POA: Diagnosis not present

## 2013-11-17 DIAGNOSIS — Z79899 Other long term (current) drug therapy: Secondary | ICD-10-CM | POA: Diagnosis not present

## 2013-11-17 DIAGNOSIS — Z91040 Latex allergy status: Secondary | ICD-10-CM | POA: Diagnosis not present

## 2013-11-17 DIAGNOSIS — K219 Gastro-esophageal reflux disease without esophagitis: Secondary | ICD-10-CM | POA: Diagnosis not present

## 2013-11-17 DIAGNOSIS — F3289 Other specified depressive episodes: Secondary | ICD-10-CM | POA: Diagnosis not present

## 2013-11-17 DIAGNOSIS — F329 Major depressive disorder, single episode, unspecified: Secondary | ICD-10-CM | POA: Diagnosis not present

## 2013-11-17 DIAGNOSIS — R269 Unspecified abnormalities of gait and mobility: Secondary | ICD-10-CM | POA: Diagnosis not present

## 2013-11-17 DIAGNOSIS — Z882 Allergy status to sulfonamides status: Secondary | ICD-10-CM | POA: Diagnosis not present

## 2013-11-21 DIAGNOSIS — T1490XA Injury, unspecified, initial encounter: Secondary | ICD-10-CM | POA: Diagnosis not present

## 2013-11-21 DIAGNOSIS — S62639A Displaced fracture of distal phalanx of unspecified finger, initial encounter for closed fracture: Secondary | ICD-10-CM | POA: Diagnosis not present

## 2013-11-21 DIAGNOSIS — S62609A Fracture of unspecified phalanx of unspecified finger, initial encounter for closed fracture: Secondary | ICD-10-CM | POA: Diagnosis not present

## 2013-11-22 DIAGNOSIS — S62639A Displaced fracture of distal phalanx of unspecified finger, initial encounter for closed fracture: Secondary | ICD-10-CM | POA: Diagnosis not present

## 2013-11-30 DIAGNOSIS — IMO0001 Reserved for inherently not codable concepts without codable children: Secondary | ICD-10-CM | POA: Diagnosis not present

## 2013-11-30 DIAGNOSIS — M25519 Pain in unspecified shoulder: Secondary | ICD-10-CM | POA: Diagnosis not present

## 2013-12-05 DIAGNOSIS — N390 Urinary tract infection, site not specified: Secondary | ICD-10-CM | POA: Diagnosis not present

## 2013-12-07 DIAGNOSIS — M25519 Pain in unspecified shoulder: Secondary | ICD-10-CM | POA: Diagnosis not present

## 2013-12-07 DIAGNOSIS — IMO0001 Reserved for inherently not codable concepts without codable children: Secondary | ICD-10-CM | POA: Diagnosis not present

## 2013-12-08 DIAGNOSIS — H35379 Puckering of macula, unspecified eye: Secondary | ICD-10-CM | POA: Diagnosis not present

## 2013-12-08 DIAGNOSIS — H2510 Age-related nuclear cataract, unspecified eye: Secondary | ICD-10-CM | POA: Diagnosis not present

## 2013-12-08 DIAGNOSIS — H43819 Vitreous degeneration, unspecified eye: Secondary | ICD-10-CM | POA: Diagnosis not present

## 2013-12-13 DIAGNOSIS — S62639A Displaced fracture of distal phalanx of unspecified finger, initial encounter for closed fracture: Secondary | ICD-10-CM | POA: Diagnosis not present

## 2013-12-13 DIAGNOSIS — IMO0001 Reserved for inherently not codable concepts without codable children: Secondary | ICD-10-CM | POA: Diagnosis not present

## 2013-12-13 DIAGNOSIS — M25519 Pain in unspecified shoulder: Secondary | ICD-10-CM | POA: Diagnosis not present

## 2013-12-14 DIAGNOSIS — IMO0002 Reserved for concepts with insufficient information to code with codable children: Secondary | ICD-10-CM | POA: Diagnosis not present

## 2013-12-14 DIAGNOSIS — M531 Cervicobrachial syndrome: Secondary | ICD-10-CM | POA: Diagnosis not present

## 2013-12-14 DIAGNOSIS — G609 Hereditary and idiopathic neuropathy, unspecified: Secondary | ICD-10-CM | POA: Diagnosis not present

## 2013-12-14 DIAGNOSIS — G5610 Other lesions of median nerve, unspecified upper limb: Secondary | ICD-10-CM | POA: Diagnosis not present

## 2013-12-14 DIAGNOSIS — M5412 Radiculopathy, cervical region: Secondary | ICD-10-CM | POA: Diagnosis not present

## 2013-12-27 DIAGNOSIS — Z23 Encounter for immunization: Secondary | ICD-10-CM | POA: Diagnosis not present

## 2014-01-09 DIAGNOSIS — F411 Generalized anxiety disorder: Secondary | ICD-10-CM | POA: Diagnosis not present

## 2014-01-09 DIAGNOSIS — IMO0001 Reserved for inherently not codable concepts without codable children: Secondary | ICD-10-CM | POA: Diagnosis not present

## 2014-01-19 DIAGNOSIS — M4180 Other forms of scoliosis, site unspecified: Secondary | ICD-10-CM | POA: Diagnosis not present

## 2014-01-23 DIAGNOSIS — IMO0001 Reserved for inherently not codable concepts without codable children: Secondary | ICD-10-CM | POA: Diagnosis not present

## 2014-01-23 DIAGNOSIS — F411 Generalized anxiety disorder: Secondary | ICD-10-CM | POA: Diagnosis not present

## 2014-01-24 DIAGNOSIS — R351 Nocturia: Secondary | ICD-10-CM | POA: Diagnosis not present

## 2014-01-24 DIAGNOSIS — R350 Frequency of micturition: Secondary | ICD-10-CM | POA: Diagnosis not present

## 2014-01-24 DIAGNOSIS — N301 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: Secondary | ICD-10-CM | POA: Diagnosis not present

## 2014-02-02 ENCOUNTER — Encounter: Payer: Self-pay | Admitting: Gynecology

## 2014-02-02 ENCOUNTER — Ambulatory Visit (INDEPENDENT_AMBULATORY_CARE_PROVIDER_SITE_OTHER): Payer: Medicare Other | Admitting: Gynecology

## 2014-02-02 ENCOUNTER — Other Ambulatory Visit: Payer: Self-pay

## 2014-02-02 VITALS — BP 140/80

## 2014-02-02 DIAGNOSIS — N94819 Vulvodynia, unspecified: Secondary | ICD-10-CM

## 2014-02-02 DIAGNOSIS — A609 Anogenital herpesviral infection, unspecified: Secondary | ICD-10-CM

## 2014-02-02 DIAGNOSIS — B009 Herpesviral infection, unspecified: Secondary | ICD-10-CM | POA: Diagnosis not present

## 2014-02-02 MED ORDER — ESTRADIOL 10 MCG VA TABS: ORAL_TABLET | VAGINAL | Status: DC

## 2014-02-02 MED ORDER — VALACYCLOVIR HCL 1 G PO TABS: 1000.0000 mg | ORAL_TABLET | Freq: Two times a day (BID) | ORAL | Status: DC

## 2014-02-02 NOTE — Progress Notes (Signed)
   68 year old with long-standing history of vulvodynia this in the process of having surgery for scoliosis whereby they're planning on putting [name] rods in September and she was beginning to experience once again the burning and tingling in the vagina at times. She is being followed by Dr. Amalia Hailey at [facility] who is treating her for interstitial cystitis. She had been onNeurontin 100 mg daily has helped with per vulvodynia. But stopped it last year. She occasionally has HSV outbreaks but very infrequently and she is not on a suppressive therapy. She was concerned that recently she felt something about a week ago on external genitalia.  Exam: Bartholin urethra Skene glands within normal limits The inferior portion of the left labia majora appears to be a crusted area which appears to be detail and have a herpes infection healing. No other lesions were noted and the external genitalia. She was not tender on any other area. Speculum exam was otherwise unremarkable, with the exception of the vaginal atrophy.  Assessment/plan: #1 vaginal atrophy I have recommended that she go on Vagifem 10 mcg twice a week risks benefits and pros and cons were discussed.  #2 she'll be placed on Valtrex 1 g twice a day for 7 days for her HSV infection #3 if after her surgery and after having been on the Vagifem she continues to have the discomfort and pain on her vulva like she had before we will place her back on Neurontin but at half the dose since she stated that it made her feel funny.

## 2014-02-03 ENCOUNTER — Telehealth: Payer: Self-pay | Admitting: *Deleted

## 2014-02-03 NOTE — Telephone Encounter (Signed)
PA form filled out for vagifmen 10 mcg twice weekly faxed to [facility], will wait for response.

## 2014-02-07 NOTE — Telephone Encounter (Signed)
Pt medication for vagifmen 10 mcg was denied by [facility] stating pt will need to try other medications such as premarin vaginal cream. Please advise

## 2014-02-08 ENCOUNTER — Other Ambulatory Visit: Payer: Self-pay | Admitting: Gynecology

## 2014-02-08 MED ORDER — NONFORMULARY OR COMPOUNDED ITEM: Status: DC

## 2014-02-08 NOTE — Telephone Encounter (Signed)
Just ask her which estrogen cream she has tolerated. I was in the impression that the primary and caused her irritation in the past. If not we'll let me know which estrogen products vaginally she has tolerated the best

## 2014-02-08 NOTE — Telephone Encounter (Signed)
Pt said that with the vagifem you wanted her to take only until 03/07/14? Pt is having surgery per note on 02/02/14 "process of having surgery for scoliosis whereby they're planning on putting [name] rods in September and she was beginning to experience once again the burning and tingling in the vagina at times. Is this the same with the cream? Please advise

## 2014-02-08 NOTE — Telephone Encounter (Signed)
Please call in the following prescriptions the compounding pharmacy [facility]) but I believe she will have to come by and picked up the prescription.  Estradiol 0.02% 1 mL prefilled applicators Apply intravaginally twice a week  3 month supply with 4 refills  Explained to patient average cost is less than $50 every 3 months

## 2014-02-08 NOTE — Telephone Encounter (Signed)
Pt informed, rx called in 

## 2014-02-08 NOTE — Telephone Encounter (Signed)
Yes, she does not need  To  Interrupt it.

## 2014-02-08 NOTE — Telephone Encounter (Signed)
Pt is fine taking estradiol 0.02% cream her only question is do you want her to take it up until her surgery?

## 2014-02-09 ENCOUNTER — Telehealth: Payer: Self-pay

## 2014-02-09 NOTE — Telephone Encounter (Signed)
Insurance had denied Vagifem tabs and because of that Estradiol Vag cream was prescribed.  Patient said she doesn't want to use the Est cream as she understood that it gets in the system and can affect her breasts.  She said she understood that the Vagifem just stayed in the vaginal area and was not absorbed.  Therefore, she called the ins co and was told the reason it was denied is because no medical necessity was indicated.  Patient said she is "high risk for breast cancer because I have very dense breasts and have had to have several biopsies over the years".  That is why she does not want to use the estradiol vag cream.  She wants Anderson Malta to call the ins co and tell them the above reason for medical necessity.  OK?

## 2014-02-09 NOTE — Telephone Encounter (Signed)
Patient with history of vulvodynia (severe atrophic vaginitis) will benefit from a low-dose vaginal estrogen twice a week with minimal or no risk for breast cancer according to studies

## 2014-02-13 ENCOUNTER — Encounter: Payer: Self-pay | Admitting: Cardiology

## 2014-02-13 ENCOUNTER — Encounter: Payer: Self-pay | Admitting: *Deleted

## 2014-02-13 DIAGNOSIS — R002 Palpitations: Secondary | ICD-10-CM | POA: Diagnosis not present

## 2014-02-13 DIAGNOSIS — I4891 Unspecified atrial fibrillation: Secondary | ICD-10-CM | POA: Diagnosis not present

## 2014-02-13 DIAGNOSIS — Z01810 Encounter for preprocedural cardiovascular examination: Secondary | ICD-10-CM | POA: Diagnosis not present

## 2014-02-13 DIAGNOSIS — F410 Panic disorder [episodic paroxysmal anxiety] without agoraphobia: Secondary | ICD-10-CM | POA: Diagnosis not present

## 2014-02-13 DIAGNOSIS — I059 Rheumatic mitral valve disease, unspecified: Secondary | ICD-10-CM | POA: Diagnosis not present

## 2014-02-13 DIAGNOSIS — R9431 Abnormal electrocardiogram [ECG] [EKG]: Secondary | ICD-10-CM | POA: Diagnosis not present

## 2014-02-13 NOTE — Progress Notes (Signed)
Patient ID: [name], female   DOB: 09-08-1945, 68 y.o.   MRN: [id number]   Regina, [name]  Date of visit:  02/13/2014 DOB:  [birth year]    Age:  68 yrs. Medical record number:  [id number]     Account number:  [id number] Primary Care Provider: Horald [name] ____________________________ CURRENT DIAGNOSES  1. Abnormal EKG  2. Panic disorder [episodic paroxysmal anxiety] without agoraphobia  3. Palpitations  4. Mitral Valve-Prolapse  5. Arrhythmia-Atrial Fibrillation ____________________________ ALLERGIES  Cipro, Intolerance-unknown  Diltiazem, Intolerance-unknown  Duloxetine, Intolerance-unknown  Erythromycin Base, Intolerance-unknown  FOSAMAX, Intolerance-unknown  IVP Dye, Iodine Containing, Hives and/or rash  Latex, Intolerance-unknown  Metoprolol, Lethargy  Penicillins, Intolerance-unknown  Sulfa (Sulfonamides), Intolerance-unknown ____________________________ MEDICATIONS  1. Vitamin D 1,000 unit Tablet, 1 p.o. daily  2. aspirin 81 mg tablet, chewable, 1 p.o. daily  3. Prelief 65 mg tablet, PRN  4. acetaminophen 500 mg capsule, PRN  5. Tums 200 mg calcium (500 mg) tablet, chewable, PRN  6. lysine 500 mg tablet, 1 p.o. daily  7. Vitamin B Complex tablet, 1 p.o. daily  8. Advil 200 mg tablet, PRN  9. loratadine 10 mg tablet, 1 p.o. daily  10. valacyclovir 1 gram tablet, 1 p.o. daily  11. oxycodone 5 mg tablet, PRN  12. clonazepam 0.5 mg tablet, PRN  13. metoprolol tartrate 25 mg tablet, PRN ____________________________ CHIEF COMPLAINTS  Followup of Arrhythmia-Atrial Fibrillation  Preop cardiac eval ____________________________ HISTORY OF PRESENT ILLNESS Patient seen for annual followup and for preoperative cardiac risk assessment. The patient has been doing well this past year. She has been told that she has been undergoing extensive spinal procedure and because she has been anxious about this has begun to have elevated pulse as well as blood pressure. She denies angina but  notes occasional episodes of palpitations. In the past we have tried to get her word the monitor but the patches break around so much that she cannot wear one. She had a myocardial perfusion scan done a year ago that was negative for ischemia. She denies PND, orthopnea or claudication. She was in sinus rhythm today. As noted above she does have a known history of a panic disorder and is quite anxious about her overall condition. ____________________________ PAST HISTORY  Past Medical Illnesses:  depression, GERD, lumbar disc disease, osteoporosis, nephrolithiasis, history of pneumonia, panic attacks, anxiety, cervical disc disease, history of melanoma, interstitial cystitis;  Cardiovascular Illnesses:  history of mitral valve prolapse, atrial fibrillation;  Surgical Procedures:  hysterectomy, breast biopsy, melanoma rt foot, ant cervical neck fusion, bunion surgery, bony cyst forehead, posterior cervical fusion 10/09, bladder surgery, right rotator cuff surgery August 2012;  Cardiology Procedures-Invasive:  no previous interventional or invasive cardiology procedures;  Cardiology Procedures-Noninvasive:  treadmill cardiolite July 2008, event monitor February 2012, echocardiogram March 2012, treadmill Myoview March 2014;  LVEF of 82% documented via nuclear study on 09/09/2012,   ____________________________ CARDIO-PULMONARY TEST DATES EKG Date:  02/13/2014;  Holter/Event Monitor Date: 08/13/2010;  Nuclear Study Date:  09/09/2012;  Echocardiography Date: 09/18/2010;  Chest Xray Date: 08/13/2010;  CT Scan Date:  06/13/2004   ____________________________ FAMILY HISTORY Brother -- Brother dead, Coronary Artery Disease, Diabetes mellitus, Coronary artery bypass grafting Brother -- Brother alive with problem Brother -- Brother alive and well Father -- Father dead, Diabetes mellitus, Deceased Mother -- Mother dead, Cancer, Deceased Sister -- Sister alive and well Sister -- Sister alive with problem,  CVA Sister -- Sister alive and well Sister -- Sister alive and well ____________________________  SOCIAL HISTORY Alcohol Use:  does not use alcohol;  Smoking:  used to smoke but quit 1984;  Diet:  regular diet;  Lifestyle:  divorced and 1 son;  Exercise:  no regular exercise;  Residence:  lives alone;   ____________________________ REVIEW OF SYSTEMS General:  fatigue Eyes: denies diplopia, history of glaucoma or visual problems. Ears, Nose, Throat, Mouth:  tinnitus Cardiovascular:  please review HPI Genitourinary-Female: nocturia, interstitial cystitis Musculoskeletal:  chronic low back pain Neurological:  headaches, dizziness, peripheral neuropathy Psychiatric:  panic disorder  ____________________________ PHYSICAL EXAMINATION VITAL SIGNS  Blood Pressure:  138/80 Sitting, Left arm, regular cuff  , 132/78 Standing, Left arm and regular cuff   Pulse:  82/min. Weight:  130.00 lbs. Height:  68"BMI: 20  Constitutional:  pleasant white female, in no acute distress, anxious Skin:  warm and dry to touch, no apparent skin lesions, or masses noted. Head:  normocephalic, normal hair pattern, no masses or tenderness ENT:  ears, nose and throat reveal no gross abnormalities.  Dentition good. Neck:  supple, no masses, thyromegaly, JVD. Carotid pulses are full and equal bilaterally without bruits. Chest:  normal symmetry, clear to auscultation. Cardiac:  regular rhythm, normal S1 and S2, No S3 or S4, no murmurs, gallops or rubs detected. Peripheral Pulses:  the femoral,dorsalis pedis, and posterior tibial pulses are full and equal bilaterally with no bruits auscultated. Neurological:  no gross motor or sensory deficits noted, affect appropriate, oriented x3. ____________________________ MOST RECENT LIPID PANEL 08/13/10  CHOL TOTL 202 mg/dl, LDL 98 NM, HDL 90 mg/dl, TRIGLYCER 68 mg/dl and CHOL/HDL 2.2 (Calc) ____________________________ IMPRESSIONS/PLAN 1. From a cardiovascular viewpoint it is  acceptable to proceed with the planned spinal surgery. Her cardiac risk should be average for her age. She should be monitored on telemetry postoperatively. She may have had some atrial arrhythmias in the remote past but we have not been able to demonstrate any recently. 2. History of mitral valve prolapse 3. History of paroxysmal atrial fibrillation  Recommendations:  Followup in one year. May proceed with surgery. EKG is normal today. ____________________________ TODAYS ORDERS  1. 12 Lead EKG: Today  2. Return Visit: 1 year                       ____________________________ Cardiology Physician:  Kerry Hough MD [facility]

## 2014-02-13 NOTE — Telephone Encounter (Signed)
Letter faxed to [facility] for appeal. Will wait for response.

## 2014-02-14 ENCOUNTER — Telehealth: Payer: Self-pay | Admitting: *Deleted

## 2014-02-14 MED ORDER — CLOBETASOL PROPIONATE 0.05 % EX CREA: 1.0000 "application " | TOPICAL_CREAM | Freq: Two times a day (BID) | CUTANEOUS | Status: DC

## 2014-02-14 NOTE — Telephone Encounter (Signed)
Pt said the spot as noted on OV 02/02/14 "The inferior portion of the left labia majora appears to be a crusted area which appears to be detail and have a herpes infection healing". States valtrex 1,000 is not working and still red. Please advise

## 2014-02-14 NOTE — Telephone Encounter (Signed)
I. goal cortisone cream. You can call in clobetasol 0.05% to apply twice a day for 7-10 days. If no improvement she will need to return to the office for possible biopsy

## 2014-02-14 NOTE — Telephone Encounter (Signed)
Pt informed with the below note, rx sent. 

## 2014-02-21 NOTE — Telephone Encounter (Signed)
vagifem 10 mcg approved by [employer]

## 2014-02-23 ENCOUNTER — Telehealth: Payer: Self-pay

## 2014-02-23 NOTE — Telephone Encounter (Signed)
Patient called stating the place she has on her vulva has not improved and she said Dr. Moshe Salisbury had recommend she come for office visit for possible biopsy. She already has appointment scheduled for tomorrow.  Not sure why she called me but I did confirm with her that she had done exactly what Dr. Moshe Salisbury recommended.

## 2014-02-24 ENCOUNTER — Encounter: Payer: Self-pay | Admitting: Gynecology

## 2014-02-24 ENCOUNTER — Ambulatory Visit (INDEPENDENT_AMBULATORY_CARE_PROVIDER_SITE_OTHER): Payer: Medicare Other | Admitting: Gynecology

## 2014-02-24 VITALS — BP 148/84 | HR 82

## 2014-02-24 DIAGNOSIS — K6289 Other specified diseases of anus and rectum: Secondary | ICD-10-CM | POA: Diagnosis not present

## 2014-02-24 MED ORDER — IMIQUIMOD 5 % EX CREA: TOPICAL_CREAM | CUTANEOUS | Status: DC

## 2014-02-26 NOTE — Progress Notes (Signed)
   Patient is a 68 year old with long-standing history of Vulvodynia who in the next couple weeks is going to be undergoing surgery for scoliosis whereby they're planning on putting [name] rods in September and she was beginning to experience once again the burning and tingling in the vagina at times. She is being followed by Dr. Amalia Hailey at [facility] who is treating her for interstitial cystitis. She had been onNeurontin 100 mg daily has helped with per vulvodynia. But stopped it last year. She occasionally has HSV outbreaks but very infrequently and she is not on a suppressive therapy. She was concerned that recently she felt something about a week ago on external genitalia. On the visit of July 30 of this year in the office she was noted to have vaginal atrophy and she was started on Vagifem 10 mcg twice a week. Due to the cost we placed her on estradiol 0.02% to apply intravaginally twice a week. She was started on Valtrex 1 g daily for 7 days for what appeared to be an HSV outbreak of the external genitalia. We have held off on starting her back on Neurontin because she did not like the way it made her feel and we were going to wait until after her back surgery.  She presented to the office with an irritated area perirectally. She denied any rectal incontinence/soilage. She does not use any Tylox or perfumes in that area. And she is not sexually active.  Exam: Bartholin urethra Skene glands with atrophic changes perineal body no lesions perirectal raw areas but no fissure or any external hemorrhoid no leukoplakia or any Discolored lesions. Just appears raw and irritated. Vagina: Atrophic changes were noted. But much better than last time since she has started estrogen vaginally.  Assessment/plan: Patient with perirectal irritation as to some form of contact dermatitis. She will eliminate any tight undergarments. She will change detergent. She will finish her clobetasol topical that she will  apply twice a day for one more week. She was reminded to use vaginal estrogen twice a week but also to apply some to the external genitalia for her vulvar atrophy.

## 2014-02-28 DIAGNOSIS — L0100 Impetigo, unspecified: Secondary | ICD-10-CM | POA: Diagnosis not present

## 2014-03-07 DIAGNOSIS — M4120 Other idiopathic scoliosis, site unspecified: Secondary | ICD-10-CM | POA: Diagnosis not present

## 2014-03-07 DIAGNOSIS — R82998 Other abnormal findings in urine: Secondary | ICD-10-CM | POA: Diagnosis not present

## 2014-03-09 DIAGNOSIS — H938X9 Other specified disorders of ear, unspecified ear: Secondary | ICD-10-CM | POA: Diagnosis not present

## 2014-03-09 DIAGNOSIS — K2100 Gastro-esophageal reflux disease with esophagitis, without bleeding: Secondary | ICD-10-CM | POA: Diagnosis not present

## 2014-03-09 DIAGNOSIS — H6120 Impacted cerumen, unspecified ear: Secondary | ICD-10-CM | POA: Diagnosis not present

## 2014-03-14 DIAGNOSIS — Z87891 Personal history of nicotine dependence: Secondary | ICD-10-CM | POA: Diagnosis not present

## 2014-03-14 DIAGNOSIS — N301 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: Secondary | ICD-10-CM | POA: Diagnosis not present

## 2014-03-14 DIAGNOSIS — R109 Unspecified abdominal pain: Secondary | ICD-10-CM | POA: Diagnosis not present

## 2014-03-14 DIAGNOSIS — Z87442 Personal history of urinary calculi: Secondary | ICD-10-CM | POA: Diagnosis not present

## 2014-03-14 DIAGNOSIS — R209 Unspecified disturbances of skin sensation: Secondary | ICD-10-CM | POA: Diagnosis not present

## 2014-03-14 DIAGNOSIS — M549 Dorsalgia, unspecified: Secondary | ICD-10-CM | POA: Diagnosis not present

## 2014-03-14 DIAGNOSIS — R1031 Right lower quadrant pain: Secondary | ICD-10-CM | POA: Diagnosis not present

## 2014-03-14 DIAGNOSIS — R82998 Other abnormal findings in urine: Secondary | ICD-10-CM | POA: Diagnosis not present

## 2014-03-14 DIAGNOSIS — R350 Frequency of micturition: Secondary | ICD-10-CM | POA: Diagnosis not present

## 2014-03-14 DIAGNOSIS — N390 Urinary tract infection, site not specified: Secondary | ICD-10-CM | POA: Diagnosis not present

## 2014-03-14 DIAGNOSIS — G8929 Other chronic pain: Secondary | ICD-10-CM | POA: Diagnosis not present

## 2014-03-14 DIAGNOSIS — R300 Dysuria: Secondary | ICD-10-CM | POA: Diagnosis not present

## 2014-03-14 DIAGNOSIS — N200 Calculus of kidney: Secondary | ICD-10-CM | POA: Diagnosis not present

## 2014-03-14 DIAGNOSIS — Z79899 Other long term (current) drug therapy: Secondary | ICD-10-CM | POA: Diagnosis not present

## 2014-03-14 DIAGNOSIS — R351 Nocturia: Secondary | ICD-10-CM | POA: Diagnosis not present

## 2014-03-15 DIAGNOSIS — N200 Calculus of kidney: Secondary | ICD-10-CM | POA: Diagnosis not present

## 2014-03-27 DIAGNOSIS — R351 Nocturia: Secondary | ICD-10-CM | POA: Diagnosis not present

## 2014-03-27 DIAGNOSIS — R300 Dysuria: Secondary | ICD-10-CM | POA: Diagnosis not present

## 2014-03-27 DIAGNOSIS — N301 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: Secondary | ICD-10-CM | POA: Diagnosis not present

## 2014-03-27 DIAGNOSIS — N390 Urinary tract infection, site not specified: Secondary | ICD-10-CM | POA: Diagnosis not present

## 2014-03-27 DIAGNOSIS — R350 Frequency of micturition: Secondary | ICD-10-CM | POA: Diagnosis not present

## 2014-03-29 DIAGNOSIS — L820 Inflamed seborrheic keratosis: Secondary | ICD-10-CM | POA: Diagnosis not present

## 2014-03-29 DIAGNOSIS — L821 Other seborrheic keratosis: Secondary | ICD-10-CM | POA: Diagnosis not present

## 2014-03-29 DIAGNOSIS — Z85820 Personal history of malignant melanoma of skin: Secondary | ICD-10-CM | POA: Diagnosis not present

## 2014-03-29 DIAGNOSIS — B009 Herpesviral infection, unspecified: Secondary | ICD-10-CM | POA: Diagnosis not present

## 2014-04-07 DIAGNOSIS — N390 Urinary tract infection, site not specified: Secondary | ICD-10-CM | POA: Diagnosis not present

## 2014-04-12 ENCOUNTER — Other Ambulatory Visit: Payer: Self-pay | Admitting: Gynecology

## 2014-04-12 DIAGNOSIS — R350 Frequency of micturition: Secondary | ICD-10-CM | POA: Diagnosis not present

## 2014-04-12 DIAGNOSIS — M25552 Pain in left hip: Secondary | ICD-10-CM | POA: Diagnosis not present

## 2014-04-12 DIAGNOSIS — M160 Bilateral primary osteoarthritis of hip: Secondary | ICD-10-CM | POA: Diagnosis not present

## 2014-04-12 DIAGNOSIS — S7002XA Contusion of left hip, initial encounter: Secondary | ICD-10-CM | POA: Diagnosis not present

## 2014-04-12 DIAGNOSIS — R300 Dysuria: Secondary | ICD-10-CM | POA: Diagnosis not present

## 2014-04-12 DIAGNOSIS — M8580 Other specified disorders of bone density and structure, unspecified site: Secondary | ICD-10-CM

## 2014-04-14 DIAGNOSIS — R0781 Pleurodynia: Secondary | ICD-10-CM | POA: Diagnosis not present

## 2014-04-14 DIAGNOSIS — R1012 Left upper quadrant pain: Secondary | ICD-10-CM | POA: Diagnosis not present

## 2014-04-14 DIAGNOSIS — K5900 Constipation, unspecified: Secondary | ICD-10-CM | POA: Diagnosis not present

## 2014-04-17 ENCOUNTER — Telehealth: Payer: Self-pay | Admitting: *Deleted

## 2014-04-17 NOTE — Telephone Encounter (Signed)
FYI Pt called stating she is going to stop for now taking the estradiol 0.02% vaginal cream, she continues to have recurrent UTI and was told by Dr.Evan ([facility]) told her best to stop for now. Pt asked me to relay this to you.

## 2014-04-20 DIAGNOSIS — R350 Frequency of micturition: Secondary | ICD-10-CM | POA: Diagnosis not present

## 2014-04-27 ENCOUNTER — Ambulatory Visit (INDEPENDENT_AMBULATORY_CARE_PROVIDER_SITE_OTHER): Payer: Medicare Other

## 2014-04-27 DIAGNOSIS — M8588 Other specified disorders of bone density and structure, other site: Secondary | ICD-10-CM | POA: Diagnosis not present

## 2014-04-27 DIAGNOSIS — M8580 Other specified disorders of bone density and structure, unspecified site: Secondary | ICD-10-CM

## 2014-04-27 DIAGNOSIS — Z23 Encounter for immunization: Secondary | ICD-10-CM | POA: Diagnosis not present

## 2014-04-28 DIAGNOSIS — R1032 Left lower quadrant pain: Secondary | ICD-10-CM | POA: Diagnosis not present

## 2014-04-28 DIAGNOSIS — K5900 Constipation, unspecified: Secondary | ICD-10-CM | POA: Diagnosis not present

## 2014-05-03 DIAGNOSIS — K5900 Constipation, unspecified: Secondary | ICD-10-CM | POA: Diagnosis not present

## 2014-05-03 DIAGNOSIS — Z7189 Other specified counseling: Secondary | ICD-10-CM | POA: Diagnosis not present

## 2014-05-04 ENCOUNTER — Other Ambulatory Visit: Payer: Self-pay | Admitting: *Deleted

## 2014-05-04 DIAGNOSIS — M8580 Other specified disorders of bone density and structure, unspecified site: Secondary | ICD-10-CM

## 2014-05-08 ENCOUNTER — Encounter: Payer: Self-pay | Admitting: Gynecology

## 2014-05-09 DIAGNOSIS — R300 Dysuria: Secondary | ICD-10-CM | POA: Diagnosis not present

## 2014-05-10 ENCOUNTER — Other Ambulatory Visit: Payer: Medicare Other

## 2014-05-10 DIAGNOSIS — M8580 Other specified disorders of bone density and structure, unspecified site: Secondary | ICD-10-CM | POA: Diagnosis not present

## 2014-05-10 DIAGNOSIS — Z1231 Encounter for screening mammogram for malignant neoplasm of breast: Secondary | ICD-10-CM | POA: Diagnosis not present

## 2014-05-11 ENCOUNTER — Other Ambulatory Visit: Payer: Self-pay | Admitting: Gynecology

## 2014-05-11 DIAGNOSIS — E349 Endocrine disorder, unspecified: Secondary | ICD-10-CM

## 2014-05-11 LAB — PTH, INTACT AND CALCIUM
Calcium: 9.3 mg/dL (ref 8.4–10.5)
PTH: 80 pg/mL — ABNORMAL HIGH (ref 14–64)

## 2014-05-11 LAB — VITAMIN D 25 HYDROXY (VIT D DEFICIENCY, FRACTURES): Vit D, 25-Hydroxy: 45 ng/mL (ref 30–89)

## 2014-05-12 ENCOUNTER — Encounter: Payer: Self-pay | Admitting: Gynecology

## 2014-05-12 DIAGNOSIS — M545 Low back pain: Secondary | ICD-10-CM | POA: Diagnosis not present

## 2014-05-12 DIAGNOSIS — F411 Generalized anxiety disorder: Secondary | ICD-10-CM | POA: Diagnosis not present

## 2014-05-12 DIAGNOSIS — E785 Hyperlipidemia, unspecified: Secondary | ICD-10-CM | POA: Diagnosis not present

## 2014-05-12 DIAGNOSIS — N301 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: Secondary | ICD-10-CM | POA: Diagnosis not present

## 2014-05-17 DIAGNOSIS — Z79899 Other long term (current) drug therapy: Secondary | ICD-10-CM | POA: Diagnosis not present

## 2014-05-17 DIAGNOSIS — K5900 Constipation, unspecified: Secondary | ICD-10-CM | POA: Diagnosis not present

## 2014-05-17 DIAGNOSIS — E785 Hyperlipidemia, unspecified: Secondary | ICD-10-CM | POA: Diagnosis not present

## 2014-05-17 DIAGNOSIS — R1032 Left lower quadrant pain: Secondary | ICD-10-CM | POA: Diagnosis not present

## 2014-05-22 ENCOUNTER — Other Ambulatory Visit: Payer: Medicare Other

## 2014-05-22 DIAGNOSIS — E349 Endocrine disorder, unspecified: Secondary | ICD-10-CM | POA: Diagnosis not present

## 2014-05-23 LAB — PARATHYROID HORMONE, INTACT (NO CA): PTH: 67 pg/mL — ABNORMAL HIGH (ref 14–64)

## 2014-05-24 ENCOUNTER — Other Ambulatory Visit: Payer: Self-pay | Admitting: Gynecology

## 2014-05-24 DIAGNOSIS — E349 Endocrine disorder, unspecified: Secondary | ICD-10-CM

## 2014-05-25 DIAGNOSIS — J069 Acute upper respiratory infection, unspecified: Secondary | ICD-10-CM | POA: Diagnosis not present

## 2014-05-25 DIAGNOSIS — H938X3 Other specified disorders of ear, bilateral: Secondary | ICD-10-CM | POA: Diagnosis not present

## 2014-05-25 DIAGNOSIS — J387 Other diseases of larynx: Secondary | ICD-10-CM | POA: Diagnosis not present

## 2014-05-26 DIAGNOSIS — J069 Acute upper respiratory infection, unspecified: Secondary | ICD-10-CM | POA: Diagnosis not present

## 2014-05-26 DIAGNOSIS — J309 Allergic rhinitis, unspecified: Secondary | ICD-10-CM | POA: Diagnosis not present

## 2014-05-27 ENCOUNTER — Telehealth: Payer: Self-pay | Admitting: Physician Assistant

## 2014-05-27 DIAGNOSIS — Z8679 Personal history of other diseases of the circulatory system: Secondary | ICD-10-CM

## 2014-05-27 MED ORDER — METOPROLOL TARTRATE 25 MG PO TABS: 25.0000 mg | ORAL_TABLET | ORAL | Status: DC | PRN

## 2014-05-27 NOTE — Telephone Encounter (Signed)
Pt called because she has URI and is concerned about what she can take if she has rapid or irregular HR.  She has been started on steroids and antibiotics.  She has metoprolol 25 mg tablets.   Advised her she could take the metoprolol 25 mg BID as needed.   She was in agreement with this as a plan of care.

## 2014-05-27 NOTE — Telephone Encounter (Signed)
Patient called back stating she did not have any Lopressor.  7 Rx for 60 tablets with no refills to her pharmacy.

## 2014-05-29 DIAGNOSIS — I499 Cardiac arrhythmia, unspecified: Secondary | ICD-10-CM | POA: Diagnosis not present

## 2014-06-05 DIAGNOSIS — H938X3 Other specified disorders of ear, bilateral: Secondary | ICD-10-CM | POA: Diagnosis not present

## 2014-06-05 DIAGNOSIS — J069 Acute upper respiratory infection, unspecified: Secondary | ICD-10-CM | POA: Diagnosis not present

## 2014-06-05 DIAGNOSIS — J309 Allergic rhinitis, unspecified: Secondary | ICD-10-CM | POA: Diagnosis not present

## 2014-06-09 DIAGNOSIS — J9690 Respiratory failure, unspecified, unspecified whether with hypoxia or hypercapnia: Secondary | ICD-10-CM | POA: Diagnosis not present

## 2014-06-09 DIAGNOSIS — G629 Polyneuropathy, unspecified: Secondary | ICD-10-CM | POA: Diagnosis not present

## 2014-06-09 DIAGNOSIS — M6281 Muscle weakness (generalized): Secondary | ICD-10-CM | POA: Diagnosis not present

## 2014-06-09 DIAGNOSIS — R079 Chest pain, unspecified: Secondary | ICD-10-CM | POA: Diagnosis not present

## 2014-06-09 DIAGNOSIS — R531 Weakness: Secondary | ICD-10-CM | POA: Diagnosis not present

## 2014-06-09 DIAGNOSIS — R2681 Unsteadiness on feet: Secondary | ICD-10-CM | POA: Diagnosis not present

## 2014-06-09 DIAGNOSIS — R633 Feeding difficulties: Secondary | ICD-10-CM | POA: Diagnosis present

## 2014-06-09 DIAGNOSIS — G8929 Other chronic pain: Secondary | ICD-10-CM | POA: Diagnosis not present

## 2014-06-09 DIAGNOSIS — M4325 Fusion of spine, thoracolumbar region: Secondary | ICD-10-CM | POA: Diagnosis not present

## 2014-06-09 DIAGNOSIS — Z4782 Encounter for orthopedic aftercare following scoliosis surgery: Secondary | ICD-10-CM | POA: Diagnosis not present

## 2014-06-09 DIAGNOSIS — M4125 Other idiopathic scoliosis, thoracolumbar region: Secondary | ICD-10-CM | POA: Diagnosis not present

## 2014-06-09 DIAGNOSIS — M4180 Other forms of scoliosis, site unspecified: Secondary | ICD-10-CM | POA: Diagnosis present

## 2014-06-09 DIAGNOSIS — Z7409 Other reduced mobility: Secondary | ICD-10-CM | POA: Diagnosis not present

## 2014-06-09 DIAGNOSIS — I341 Nonrheumatic mitral (valve) prolapse: Secondary | ICD-10-CM | POA: Diagnosis present

## 2014-06-09 DIAGNOSIS — Z85820 Personal history of malignant melanoma of skin: Secondary | ICD-10-CM | POA: Diagnosis not present

## 2014-06-09 DIAGNOSIS — R9431 Abnormal electrocardiogram [ECG] [EKG]: Secondary | ICD-10-CM | POA: Diagnosis not present

## 2014-06-09 DIAGNOSIS — M549 Dorsalgia, unspecified: Secondary | ICD-10-CM | POA: Diagnosis not present

## 2014-06-09 DIAGNOSIS — M1990 Unspecified osteoarthritis, unspecified site: Secondary | ICD-10-CM | POA: Diagnosis present

## 2014-06-09 DIAGNOSIS — I4891 Unspecified atrial fibrillation: Secondary | ICD-10-CM | POA: Diagnosis present

## 2014-06-09 DIAGNOSIS — R570 Cardiogenic shock: Secondary | ICD-10-CM | POA: Diagnosis not present

## 2014-06-09 DIAGNOSIS — K5900 Constipation, unspecified: Secondary | ICD-10-CM | POA: Diagnosis present

## 2014-06-09 DIAGNOSIS — K219 Gastro-esophageal reflux disease without esophagitis: Secondary | ICD-10-CM | POA: Diagnosis not present

## 2014-06-09 DIAGNOSIS — R488 Other symbolic dysfunctions: Secondary | ICD-10-CM | POA: Diagnosis not present

## 2014-06-09 DIAGNOSIS — R52 Pain, unspecified: Secondary | ICD-10-CM | POA: Diagnosis not present

## 2014-06-09 DIAGNOSIS — T8119XA Other postprocedural shock, initial encounter: Secondary | ICD-10-CM | POA: Diagnosis not present

## 2014-06-09 DIAGNOSIS — Z87442 Personal history of urinary calculi: Secondary | ICD-10-CM | POA: Diagnosis not present

## 2014-06-09 DIAGNOSIS — R1310 Dysphagia, unspecified: Secondary | ICD-10-CM | POA: Diagnosis not present

## 2014-06-09 DIAGNOSIS — N301 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: Secondary | ICD-10-CM | POA: Diagnosis not present

## 2014-06-09 DIAGNOSIS — F419 Anxiety disorder, unspecified: Secondary | ICD-10-CM | POA: Diagnosis present

## 2014-06-09 DIAGNOSIS — M419 Scoliosis, unspecified: Secondary | ICD-10-CM | POA: Diagnosis not present

## 2014-06-09 DIAGNOSIS — Z87891 Personal history of nicotine dependence: Secondary | ICD-10-CM | POA: Diagnosis not present

## 2014-06-09 DIAGNOSIS — K224 Dyskinesia of esophagus: Secondary | ICD-10-CM | POA: Diagnosis present

## 2014-06-09 DIAGNOSIS — B961 Klebsiella pneumoniae [K. pneumoniae] as the cause of diseases classified elsewhere: Secondary | ICD-10-CM | POA: Diagnosis present

## 2014-06-19 DIAGNOSIS — Z4782 Encounter for orthopedic aftercare following scoliosis surgery: Secondary | ICD-10-CM | POA: Diagnosis not present

## 2014-06-19 DIAGNOSIS — I479 Paroxysmal tachycardia, unspecified: Secondary | ICD-10-CM | POA: Diagnosis not present

## 2014-06-19 DIAGNOSIS — G8929 Other chronic pain: Secondary | ICD-10-CM | POA: Diagnosis not present

## 2014-06-19 DIAGNOSIS — B029 Zoster without complications: Secondary | ICD-10-CM | POA: Insufficient documentation

## 2014-06-19 DIAGNOSIS — F329 Major depressive disorder, single episode, unspecified: Secondary | ICD-10-CM | POA: Insufficient documentation

## 2014-06-19 DIAGNOSIS — I4891 Unspecified atrial fibrillation: Secondary | ICD-10-CM | POA: Diagnosis not present

## 2014-06-19 DIAGNOSIS — D519 Vitamin B12 deficiency anemia, unspecified: Secondary | ICD-10-CM | POA: Diagnosis not present

## 2014-06-19 DIAGNOSIS — M1990 Unspecified osteoarthritis, unspecified site: Secondary | ICD-10-CM | POA: Insufficient documentation

## 2014-06-19 DIAGNOSIS — R262 Difficulty in walking, not elsewhere classified: Secondary | ICD-10-CM | POA: Diagnosis not present

## 2014-06-19 DIAGNOSIS — M6281 Muscle weakness (generalized): Secondary | ICD-10-CM | POA: Diagnosis not present

## 2014-06-19 DIAGNOSIS — R531 Weakness: Secondary | ICD-10-CM | POA: Diagnosis not present

## 2014-06-19 DIAGNOSIS — M419 Scoliosis, unspecified: Secondary | ICD-10-CM | POA: Diagnosis not present

## 2014-06-19 DIAGNOSIS — E639 Nutritional deficiency, unspecified: Secondary | ICD-10-CM | POA: Diagnosis not present

## 2014-06-19 DIAGNOSIS — R52 Pain, unspecified: Secondary | ICD-10-CM | POA: Diagnosis not present

## 2014-06-19 DIAGNOSIS — F419 Anxiety disorder, unspecified: Secondary | ICD-10-CM | POA: Diagnosis not present

## 2014-06-19 DIAGNOSIS — R2681 Unsteadiness on feet: Secondary | ICD-10-CM | POA: Diagnosis not present

## 2014-06-19 DIAGNOSIS — M4320 Fusion of spine, site unspecified: Secondary | ICD-10-CM | POA: Diagnosis not present

## 2014-06-19 DIAGNOSIS — R351 Nocturia: Secondary | ICD-10-CM | POA: Diagnosis not present

## 2014-06-19 DIAGNOSIS — R350 Frequency of micturition: Secondary | ICD-10-CM | POA: Diagnosis not present

## 2014-06-19 DIAGNOSIS — G629 Polyneuropathy, unspecified: Secondary | ICD-10-CM | POA: Insufficient documentation

## 2014-06-19 DIAGNOSIS — Z7409 Other reduced mobility: Secondary | ICD-10-CM | POA: Diagnosis not present

## 2014-06-19 DIAGNOSIS — M546 Pain in thoracic spine: Secondary | ICD-10-CM | POA: Diagnosis not present

## 2014-06-19 DIAGNOSIS — R339 Retention of urine, unspecified: Secondary | ICD-10-CM | POA: Diagnosis not present

## 2014-06-19 DIAGNOSIS — R109 Unspecified abdominal pain: Secondary | ICD-10-CM | POA: Diagnosis not present

## 2014-06-19 DIAGNOSIS — H43813 Vitreous degeneration, bilateral: Secondary | ICD-10-CM | POA: Insufficient documentation

## 2014-06-19 DIAGNOSIS — R488 Other symbolic dysfunctions: Secondary | ICD-10-CM | POA: Diagnosis not present

## 2014-06-19 DIAGNOSIS — I499 Cardiac arrhythmia, unspecified: Secondary | ICD-10-CM | POA: Diagnosis not present

## 2014-06-19 DIAGNOSIS — E876 Hypokalemia: Secondary | ICD-10-CM | POA: Diagnosis present

## 2014-06-19 DIAGNOSIS — N301 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: Secondary | ICD-10-CM | POA: Diagnosis not present

## 2014-06-19 DIAGNOSIS — S329XXA Fracture of unspecified parts of lumbosacral spine and pelvis, initial encounter for closed fracture: Secondary | ICD-10-CM | POA: Diagnosis not present

## 2014-06-19 DIAGNOSIS — K5900 Constipation, unspecified: Secondary | ICD-10-CM | POA: Diagnosis not present

## 2014-06-19 DIAGNOSIS — R079 Chest pain, unspecified: Secondary | ICD-10-CM | POA: Diagnosis not present

## 2014-06-19 DIAGNOSIS — H2510 Age-related nuclear cataract, unspecified eye: Secondary | ICD-10-CM | POA: Insufficient documentation

## 2014-06-19 DIAGNOSIS — M545 Low back pain: Secondary | ICD-10-CM | POA: Diagnosis not present

## 2014-06-20 DIAGNOSIS — I479 Paroxysmal tachycardia, unspecified: Secondary | ICD-10-CM | POA: Diagnosis not present

## 2014-06-20 DIAGNOSIS — S329XXA Fracture of unspecified parts of lumbosacral spine and pelvis, initial encounter for closed fracture: Secondary | ICD-10-CM | POA: Diagnosis not present

## 2014-06-20 DIAGNOSIS — K5900 Constipation, unspecified: Secondary | ICD-10-CM | POA: Diagnosis not present

## 2014-06-20 DIAGNOSIS — M545 Low back pain: Secondary | ICD-10-CM | POA: Diagnosis not present

## 2014-06-21 DIAGNOSIS — K5900 Constipation, unspecified: Secondary | ICD-10-CM | POA: Diagnosis not present

## 2014-06-21 DIAGNOSIS — M419 Scoliosis, unspecified: Secondary | ICD-10-CM | POA: Diagnosis not present

## 2014-06-21 DIAGNOSIS — M545 Low back pain: Secondary | ICD-10-CM | POA: Diagnosis not present

## 2014-06-21 DIAGNOSIS — E876 Hypokalemia: Secondary | ICD-10-CM | POA: Diagnosis not present

## 2014-06-23 DIAGNOSIS — M545 Low back pain: Secondary | ICD-10-CM | POA: Diagnosis not present

## 2014-06-23 DIAGNOSIS — M546 Pain in thoracic spine: Secondary | ICD-10-CM | POA: Diagnosis not present

## 2014-06-23 DIAGNOSIS — R262 Difficulty in walking, not elsewhere classified: Secondary | ICD-10-CM | POA: Diagnosis not present

## 2014-06-26 DIAGNOSIS — M545 Low back pain: Secondary | ICD-10-CM | POA: Diagnosis not present

## 2014-06-26 DIAGNOSIS — R262 Difficulty in walking, not elsewhere classified: Secondary | ICD-10-CM | POA: Diagnosis not present

## 2014-06-26 DIAGNOSIS — M6281 Muscle weakness (generalized): Secondary | ICD-10-CM | POA: Diagnosis not present

## 2014-06-27 DIAGNOSIS — R350 Frequency of micturition: Secondary | ICD-10-CM | POA: Diagnosis not present

## 2014-06-27 DIAGNOSIS — R351 Nocturia: Secondary | ICD-10-CM | POA: Diagnosis not present

## 2014-06-27 DIAGNOSIS — N301 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: Secondary | ICD-10-CM | POA: Diagnosis not present

## 2014-06-27 DIAGNOSIS — R339 Retention of urine, unspecified: Secondary | ICD-10-CM | POA: Diagnosis not present

## 2014-06-29 DIAGNOSIS — M545 Low back pain: Secondary | ICD-10-CM | POA: Diagnosis not present

## 2014-06-29 DIAGNOSIS — M546 Pain in thoracic spine: Secondary | ICD-10-CM | POA: Diagnosis not present

## 2014-07-04 DIAGNOSIS — F419 Anxiety disorder, unspecified: Secondary | ICD-10-CM | POA: Diagnosis not present

## 2014-07-04 DIAGNOSIS — M4320 Fusion of spine, site unspecified: Secondary | ICD-10-CM | POA: Diagnosis not present

## 2014-07-04 DIAGNOSIS — I479 Paroxysmal tachycardia, unspecified: Secondary | ICD-10-CM | POA: Diagnosis not present

## 2014-07-07 HISTORY — PX: HARDWARE REMOVAL: SHX979

## 2014-07-07 HISTORY — PX: COSMETIC SURGERY: SHX468

## 2014-07-10 DIAGNOSIS — K5730 Diverticulosis of large intestine without perforation or abscess without bleeding: Secondary | ICD-10-CM | POA: Diagnosis not present

## 2014-07-10 DIAGNOSIS — S2242XA Multiple fractures of ribs, left side, initial encounter for closed fracture: Secondary | ICD-10-CM | POA: Diagnosis not present

## 2014-07-10 DIAGNOSIS — X58XXXA Exposure to other specified factors, initial encounter: Secondary | ICD-10-CM | POA: Diagnosis not present

## 2014-07-10 DIAGNOSIS — S2243XA Multiple fractures of ribs, bilateral, initial encounter for closed fracture: Secondary | ICD-10-CM | POA: Diagnosis not present

## 2014-07-10 DIAGNOSIS — Y846 Urinary catheterization as the cause of abnormal reaction of the patient, or of later complication, without mention of misadventure at the time of the procedure: Secondary | ICD-10-CM | POA: Diagnosis not present

## 2014-07-10 DIAGNOSIS — M549 Dorsalgia, unspecified: Secondary | ICD-10-CM | POA: Diagnosis not present

## 2014-07-10 DIAGNOSIS — N390 Urinary tract infection, site not specified: Secondary | ICD-10-CM | POA: Diagnosis not present

## 2014-07-10 DIAGNOSIS — E785 Hyperlipidemia, unspecified: Secondary | ICD-10-CM | POA: Diagnosis not present

## 2014-07-10 DIAGNOSIS — R072 Precordial pain: Secondary | ICD-10-CM | POA: Diagnosis not present

## 2014-07-10 DIAGNOSIS — B9689 Other specified bacterial agents as the cause of diseases classified elsewhere: Secondary | ICD-10-CM | POA: Diagnosis not present

## 2014-07-10 DIAGNOSIS — T83098A Other mechanical complication of other indwelling urethral catheter, initial encounter: Secondary | ICD-10-CM | POA: Diagnosis not present

## 2014-07-10 DIAGNOSIS — S2241XA Multiple fractures of ribs, right side, initial encounter for closed fracture: Secondary | ICD-10-CM | POA: Diagnosis not present

## 2014-07-10 DIAGNOSIS — I4891 Unspecified atrial fibrillation: Secondary | ICD-10-CM | POA: Diagnosis not present

## 2014-07-10 DIAGNOSIS — F329 Major depressive disorder, single episode, unspecified: Secondary | ICD-10-CM | POA: Diagnosis not present

## 2014-07-10 DIAGNOSIS — Z981 Arthrodesis status: Secondary | ICD-10-CM | POA: Diagnosis not present

## 2014-07-10 DIAGNOSIS — Z87891 Personal history of nicotine dependence: Secondary | ICD-10-CM | POA: Diagnosis not present

## 2014-07-10 DIAGNOSIS — M4854XA Collapsed vertebra, not elsewhere classified, thoracic region, initial encounter for fracture: Secondary | ICD-10-CM | POA: Diagnosis not present

## 2014-07-10 DIAGNOSIS — G629 Polyneuropathy, unspecified: Secondary | ICD-10-CM | POA: Diagnosis not present

## 2014-07-10 DIAGNOSIS — T8351XA Infection and inflammatory reaction due to indwelling urinary catheter, initial encounter: Secondary | ICD-10-CM | POA: Diagnosis not present

## 2014-07-10 DIAGNOSIS — J90 Pleural effusion, not elsewhere classified: Secondary | ICD-10-CM | POA: Diagnosis not present

## 2014-07-10 DIAGNOSIS — S22048A Other fracture of fourth thoracic vertebra, initial encounter for closed fracture: Secondary | ICD-10-CM | POA: Diagnosis not present

## 2014-07-10 DIAGNOSIS — R1310 Dysphagia, unspecified: Secondary | ICD-10-CM | POA: Diagnosis not present

## 2014-07-10 DIAGNOSIS — R748 Abnormal levels of other serum enzymes: Secondary | ICD-10-CM | POA: Diagnosis not present

## 2014-07-10 DIAGNOSIS — R609 Edema, unspecified: Secondary | ICD-10-CM | POA: Diagnosis not present

## 2014-07-10 DIAGNOSIS — M542 Cervicalgia: Secondary | ICD-10-CM | POA: Diagnosis not present

## 2014-07-10 DIAGNOSIS — M5489 Other dorsalgia: Secondary | ICD-10-CM | POA: Diagnosis not present

## 2014-07-10 DIAGNOSIS — S2249XA Multiple fractures of ribs, unspecified side, initial encounter for closed fracture: Secondary | ICD-10-CM | POA: Diagnosis not present

## 2014-07-11 DIAGNOSIS — F411 Generalized anxiety disorder: Secondary | ICD-10-CM | POA: Diagnosis not present

## 2014-07-11 DIAGNOSIS — X58XXXA Exposure to other specified factors, initial encounter: Secondary | ICD-10-CM | POA: Diagnosis not present

## 2014-07-11 DIAGNOSIS — S2243XA Multiple fractures of ribs, bilateral, initial encounter for closed fracture: Secondary | ICD-10-CM | POA: Diagnosis not present

## 2014-07-11 DIAGNOSIS — R748 Abnormal levels of other serum enzymes: Secondary | ICD-10-CM | POA: Diagnosis not present

## 2014-07-11 DIAGNOSIS — K219 Gastro-esophageal reflux disease without esophagitis: Secondary | ICD-10-CM | POA: Diagnosis not present

## 2014-07-11 DIAGNOSIS — R109 Unspecified abdominal pain: Secondary | ICD-10-CM | POA: Diagnosis not present

## 2014-07-11 DIAGNOSIS — G629 Polyneuropathy, unspecified: Secondary | ICD-10-CM | POA: Insufficient documentation

## 2014-07-11 DIAGNOSIS — R072 Precordial pain: Secondary | ICD-10-CM | POA: Diagnosis not present

## 2014-07-11 DIAGNOSIS — J90 Pleural effusion, not elsewhere classified: Secondary | ICD-10-CM | POA: Diagnosis not present

## 2014-07-12 DIAGNOSIS — I340 Nonrheumatic mitral (valve) insufficiency: Secondary | ICD-10-CM | POA: Diagnosis not present

## 2014-07-12 DIAGNOSIS — R079 Chest pain, unspecified: Secondary | ICD-10-CM | POA: Diagnosis not present

## 2014-07-12 DIAGNOSIS — K219 Gastro-esophageal reflux disease without esophagitis: Secondary | ICD-10-CM | POA: Diagnosis not present

## 2014-07-12 DIAGNOSIS — F411 Generalized anxiety disorder: Secondary | ICD-10-CM | POA: Diagnosis not present

## 2014-07-12 DIAGNOSIS — I5030 Unspecified diastolic (congestive) heart failure: Secondary | ICD-10-CM | POA: Diagnosis not present

## 2014-07-13 DIAGNOSIS — R000 Tachycardia, unspecified: Secondary | ICD-10-CM | POA: Diagnosis not present

## 2014-07-13 DIAGNOSIS — D62 Acute posthemorrhagic anemia: Secondary | ICD-10-CM | POA: Diagnosis not present

## 2014-07-13 DIAGNOSIS — Z87891 Personal history of nicotine dependence: Secondary | ICD-10-CM | POA: Diagnosis not present

## 2014-07-13 DIAGNOSIS — M4325 Fusion of spine, thoracolumbar region: Secondary | ICD-10-CM | POA: Diagnosis not present

## 2014-07-13 DIAGNOSIS — S2241XA Multiple fractures of ribs, right side, initial encounter for closed fracture: Secondary | ICD-10-CM | POA: Diagnosis not present

## 2014-07-13 DIAGNOSIS — M40294 Other kyphosis, thoracic region: Secondary | ICD-10-CM | POA: Diagnosis present

## 2014-07-13 DIAGNOSIS — I480 Paroxysmal atrial fibrillation: Secondary | ICD-10-CM | POA: Diagnosis present

## 2014-07-13 DIAGNOSIS — Z9109 Other allergy status, other than to drugs and biological substances: Secondary | ICD-10-CM | POA: Diagnosis not present

## 2014-07-13 DIAGNOSIS — Z9889 Other specified postprocedural states: Secondary | ICD-10-CM | POA: Diagnosis not present

## 2014-07-13 DIAGNOSIS — R1310 Dysphagia, unspecified: Secondary | ICD-10-CM | POA: Diagnosis present

## 2014-07-13 DIAGNOSIS — E785 Hyperlipidemia, unspecified: Secondary | ICD-10-CM | POA: Diagnosis present

## 2014-07-13 DIAGNOSIS — Z981 Arthrodesis status: Secondary | ICD-10-CM | POA: Diagnosis not present

## 2014-07-13 DIAGNOSIS — F411 Generalized anxiety disorder: Secondary | ICD-10-CM | POA: Diagnosis not present

## 2014-07-13 DIAGNOSIS — H8110 Benign paroxysmal vertigo, unspecified ear: Secondary | ICD-10-CM | POA: Diagnosis present

## 2014-07-13 DIAGNOSIS — F419 Anxiety disorder, unspecified: Secondary | ICD-10-CM | POA: Diagnosis not present

## 2014-07-13 DIAGNOSIS — R32 Unspecified urinary incontinence: Secondary | ICD-10-CM | POA: Diagnosis not present

## 2014-07-13 DIAGNOSIS — R52 Pain, unspecified: Secondary | ICD-10-CM | POA: Diagnosis not present

## 2014-07-13 DIAGNOSIS — I4891 Unspecified atrial fibrillation: Secondary | ICD-10-CM | POA: Diagnosis present

## 2014-07-13 DIAGNOSIS — F329 Major depressive disorder, single episode, unspecified: Secondary | ICD-10-CM | POA: Diagnosis present

## 2014-07-13 DIAGNOSIS — S2249XA Multiple fractures of ribs, unspecified side, initial encounter for closed fracture: Secondary | ICD-10-CM | POA: Diagnosis not present

## 2014-07-13 DIAGNOSIS — G629 Polyneuropathy, unspecified: Secondary | ICD-10-CM | POA: Diagnosis present

## 2014-07-13 DIAGNOSIS — I341 Nonrheumatic mitral (valve) prolapse: Secondary | ICD-10-CM | POA: Diagnosis present

## 2014-07-13 DIAGNOSIS — S2242XA Multiple fractures of ribs, left side, initial encounter for closed fracture: Secondary | ICD-10-CM | POA: Diagnosis not present

## 2014-07-13 DIAGNOSIS — K5900 Constipation, unspecified: Secondary | ICD-10-CM | POA: Diagnosis not present

## 2014-07-13 DIAGNOSIS — G8929 Other chronic pain: Secondary | ICD-10-CM | POA: Diagnosis not present

## 2014-07-13 DIAGNOSIS — G9009 Other idiopathic peripheral autonomic neuropathy: Secondary | ICD-10-CM | POA: Diagnosis not present

## 2014-07-13 DIAGNOSIS — R339 Retention of urine, unspecified: Secondary | ICD-10-CM | POA: Diagnosis present

## 2014-07-13 DIAGNOSIS — N201 Calculus of ureter: Secondary | ICD-10-CM | POA: Diagnosis not present

## 2014-07-13 DIAGNOSIS — K219 Gastro-esophageal reflux disease without esophagitis: Secondary | ICD-10-CM | POA: Diagnosis not present

## 2014-07-13 DIAGNOSIS — I340 Nonrheumatic mitral (valve) insufficiency: Secondary | ICD-10-CM | POA: Diagnosis not present

## 2014-07-13 DIAGNOSIS — J329 Chronic sinusitis, unspecified: Secondary | ICD-10-CM | POA: Diagnosis present

## 2014-07-13 DIAGNOSIS — M545 Low back pain: Secondary | ICD-10-CM | POA: Diagnosis not present

## 2014-07-13 DIAGNOSIS — R609 Edema, unspecified: Secondary | ICD-10-CM | POA: Diagnosis not present

## 2014-07-13 DIAGNOSIS — Z7409 Other reduced mobility: Secondary | ICD-10-CM | POA: Diagnosis not present

## 2014-07-13 DIAGNOSIS — N390 Urinary tract infection, site not specified: Secondary | ICD-10-CM | POA: Diagnosis not present

## 2014-07-13 DIAGNOSIS — J302 Other seasonal allergic rhinitis: Secondary | ICD-10-CM | POA: Diagnosis not present

## 2014-07-13 DIAGNOSIS — M549 Dorsalgia, unspecified: Secondary | ICD-10-CM | POA: Diagnosis not present

## 2014-07-14 ENCOUNTER — Encounter: Payer: Self-pay | Admitting: Adult Health

## 2014-07-14 ENCOUNTER — Non-Acute Institutional Stay (SKILLED_NURSING_FACILITY): Payer: Medicare Other | Admitting: Adult Health

## 2014-07-14 DIAGNOSIS — F419 Anxiety disorder, unspecified: Secondary | ICD-10-CM | POA: Diagnosis not present

## 2014-07-14 DIAGNOSIS — J302 Other seasonal allergic rhinitis: Secondary | ICD-10-CM

## 2014-07-14 DIAGNOSIS — N201 Calculus of ureter: Secondary | ICD-10-CM | POA: Diagnosis not present

## 2014-07-14 DIAGNOSIS — K219 Gastro-esophageal reflux disease without esophagitis: Secondary | ICD-10-CM | POA: Diagnosis not present

## 2014-07-14 DIAGNOSIS — K5900 Constipation, unspecified: Secondary | ICD-10-CM | POA: Insufficient documentation

## 2014-07-14 DIAGNOSIS — M545 Low back pain: Secondary | ICD-10-CM

## 2014-07-14 DIAGNOSIS — Z981 Arthrodesis status: Secondary | ICD-10-CM

## 2014-07-14 DIAGNOSIS — N390 Urinary tract infection, site not specified: Secondary | ICD-10-CM

## 2014-07-14 MED ORDER — METHOCARBAMOL 500 MG PO TABS: 500.0000 mg | ORAL_TABLET | Freq: Three times a day (TID) | ORAL | Status: DC

## 2014-07-14 MED ORDER — OXYCODONE HCL 5 MG PO TABA: 5.0000 mg | ORAL_TABLET | ORAL | Status: DC

## 2014-07-14 NOTE — Progress Notes (Signed)
Patient ID: [name], female   DOB: 07-14-1945, 70 y.o.   MRN: [id number]  [facility]     Allergies  Allergen Reactions  . Contrast Media [Iodinated Diagnostic Agents]   . Erythromycin   . Latex   . Levofloxacin Nausea And Vomiting  . Other     IV CONTRAST /"DYE".  . Septra [Bactrim]   . Sulfa Antibiotics        Chief Complaint  Patient presents with  . Hospitalization Follow-up    HPI:  She has had a spinal fusion in Dec 2015. She did go home after her surgery. She did return back to the hospital with chest pain and uti. She was found to have multiple bilateral rib fractures. She has a foley; however; it is somewhat unclear as to why  She has a foley in place; she is due to got urology at [facility] for follow up and foley removal. She is having pain issues. She has chronic back pain and with the fractures the pain is presently worse. She is not getting adequate relief at this time. She is here for short term rehab and will return back home.      Past Medical History  Diagnosis Date  . Vaginal delivery     ONE NSVD  . Vulvodynia   . PMDD (premenstrual dysphoric disorder)   . IC (intermittent claudication)   . Atrial fibrillation   . Melanoma of ankle   . Rotator cuff tear   . Melanoma   . Kidney stone   . Heart disease     noted A Fib once  . GERD (gastroesophageal reflux disease)   . Herpes   . Allergy   . Anxiety   . Benign paroxysmal positional vertigo 06/08/2013  . Peripheral neuropathy 11/06/2011  . Hair loss 04/12/2012  . Osteopenia 02/18/2012  . 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 Interstitial cystitis 11/06/2011  . Fracture of multiple ribs     Past Surgical History  Procedure Laterality Date  . Vaginal hysterectomy      TVH  . Back surgery      DISC FUSION L5,L6,L7  . Bunionectomy    . Head & neck skin lesion excisional biopsy      melanoma  . Bladder surgery    . Rotator cuff repair  2012    RIGHT  . Breast surgery      BREAST BIOPSY--RIGHT BENIGN  .  Spinal fusion  dec 2015     VITAL SIGNS BP 108/74 mmHg  Pulse 87  Ht _0  (1.702 m)  Wt 132 lb (59.875 kg)  BMI 20.67 kg/m2   Outpatient Encounter Prescriptions as of 07/14/2014  Medication Sig  . acetaminophen (TYLENOL) 500 MG tablet Take 1,000 mg by mouth every 12 (twelve) hours.  . calcium carbonate (OS-CAL) 600 MG TABS tablet Take 600 mg by mouth daily with breakfast.  . Calcium Glycerophosphate 340 (65-50) MG (CA-P) TABS Take 1 tablet by mouth daily.  . cefpodoxime (VANTIN) 200 MG tablet Take 200 mg by mouth daily.  . cholecalciferol (VITAMIN D) 1000 UNITS tablet Take 1,000 Units by mouth daily.  . clonazePAM (KLONOPIN) 0.5 MG tablet Take 0.25 mg by mouth 2 (two) times daily as needed for anxiety.  . Cyanocobalamin 1000 MCG/ML KIT Inject 1 mL as directed every 30 (thirty) days.  . fexofenadine (ALLEGRA) 180 MG tablet Take 180 mg by mouth daily.  . fluticasone (FLONASE) 50 MCG/ACT nasal spray Place 2 sprays into both nostrils daily.  [name] oxyCODONE (  OXY IR/ROXICODONE) 5 MG immediate release tablet Take 5 mg by mouth every 4 (four) hours as needed for severe pain.  . OxyCODONE (OXYCONTIN) 20 mg T12A 12 hr tablet Take 20 mg by mouth every 12 (twelve) hours.  . pantoprazole (PROTONIX) 20 MG tablet Take 20 mg by mouth daily.     SIGNIFICANT DIAGNOSTIC EXAMS  07-10-14 ct of abdomen and pelvis: small right pleural effusion. No acute findings in the abdomen or pelvis  07-11-14: ekg: sinus tachycardia with first degree av block   07-11-14: ct of chest: multiple bilateral rib fractures. Several of these appear to have subtle callus formation any be subacute. No  visualized pneumothorax. Small bilateral pleural effusions. Fluid within the esophagus which can be seen in the gastroesophageal reflux of possible in complete esophageal clearance. Small bilateral pulmonary nodules measuring upwards of 3 mm.   07-11-14: chest x-ray: no plain radiographic of acute cardiopulmonary disease. Hyperinflation.  Multiple rib fractures which are in comparison to 05-03-14. Many of these appear acute or subacute.   07-13-14: TEE: ef 60-65%; with normal left ventricular wall thickness and wall motion.   LABS REVIEWED:   07-11-14: wbc 4.8; hgb 11.9; hct 38.5; mcv 94 ;plt 241 glucose 95; bun 10; creat 0.56; k+3.2; na++144; alk phos 223; ast 10; ast 22; t bili 0.42; albumin 3.4; hgb a1c 4.9  07-12-14: wbc 3.0; hgb 10.4; hct 32.6; mcv 94; plt 186  glucose 102; bun 12; creat 0.48 ;k+4.1; na++143 mag 1.9  07-13-13: wbc 4.4; hgb 11.6; hct 37.2; mcv 94; plt 246;  glucose 93; bun 8; creat 0.48; k+4.2; na++143     Review of Systems  Constitutional: Positive for malaise/fatigue.  Respiratory: Negative for cough and shortness of breath.   Cardiovascular: Positive for chest pain. Negative for palpitations and leg swelling.       From rib fractures   Gastrointestinal: Negative for heartburn, abdominal pain and constipation.  Musculoskeletal: Positive for myalgias, back pain, joint pain and neck pain.  Psychiatric/Behavioral: Negative for depression. The patient is nervous/anxious.      Physical Exam  Constitutional: She is oriented to person, place, and time. She appears distressed.  Thin is mildly distressed   Neck: Neck supple. No JVD present. No thyromegaly present.  Cardiovascular: Normal rate, regular rhythm and intact distal pulses.   Respiratory: Effort normal and breath sounds normal. No respiratory distress.  GI: Soft. Bowel sounds are normal. She exhibits no distension.  Musculoskeletal: She exhibits no edema.  Has limited range of motion in neck.  Is able to move all extremities States has difficulty holding head upright at times  Has kyphosis   Neurological: She is alert and oriented to person, place, and time.  Skin: Skin is warm and dry. She is not diaphoretic.  Incision line on back without signs of infection present. Has hematoma on upper incisional area        ASSESSMENT/ PLAN:  1.  Chronic back pain: her pain is not being adequately relieved is 8-9/10: will continue oxycontin 20 mg twice daily tylenol 1 gm twice daily will change the oxycodone to 5 mg every 4 hours routinely and tid prn for break through pain. Will being robaxin 500 mg every 8 hours. Will continue therapy as directed and will monitor her status.   2. multiple rib fractures: will continue oxycontin 20 mg twice daily and will change her oxycodone to 5 mg every 4 hours routinely with 5 mg tid for break through pain; will monitor her status for  signs of distress  3.  Allergic rhinitis: will continue allegra 180 mg daily and flonase daily   4. Anxiety: will continue klonopin 0.25 mg twice daily as needed; if her pain is better managed; hopefully her anxiety will come under better control as well  5. UTI: she presently has a foley; is followed by urology at [facility]; si due to be seen on Monday will continue vantin 200 mg daily will monitor  6. Gerd: will continue protonix 20 mg daily   7. Constipation: will continue miralax daily    Time spent with patient one hour   Ok Edwards NP [facility]  Contact [phone] Monday through Friday 8am- 5pm  After hours call [phone]

## 2014-07-17 DIAGNOSIS — R339 Retention of urine, unspecified: Secondary | ICD-10-CM | POA: Diagnosis not present

## 2014-07-20 DIAGNOSIS — R000 Tachycardia, unspecified: Secondary | ICD-10-CM | POA: Diagnosis not present

## 2014-07-20 DIAGNOSIS — R339 Retention of urine, unspecified: Secondary | ICD-10-CM | POA: Diagnosis present

## 2014-07-20 DIAGNOSIS — Z7409 Other reduced mobility: Secondary | ICD-10-CM | POA: Diagnosis not present

## 2014-07-20 DIAGNOSIS — D62 Acute posthemorrhagic anemia: Secondary | ICD-10-CM | POA: Diagnosis not present

## 2014-07-20 DIAGNOSIS — I480 Paroxysmal atrial fibrillation: Secondary | ICD-10-CM | POA: Diagnosis present

## 2014-07-20 DIAGNOSIS — Z87891 Personal history of nicotine dependence: Secondary | ICD-10-CM | POA: Diagnosis not present

## 2014-07-20 DIAGNOSIS — F419 Anxiety disorder, unspecified: Secondary | ICD-10-CM | POA: Diagnosis present

## 2014-07-20 DIAGNOSIS — Z9889 Other specified postprocedural states: Secondary | ICD-10-CM | POA: Diagnosis not present

## 2014-07-20 DIAGNOSIS — R609 Edema, unspecified: Secondary | ICD-10-CM | POA: Diagnosis not present

## 2014-07-20 DIAGNOSIS — M549 Dorsalgia, unspecified: Secondary | ICD-10-CM | POA: Diagnosis not present

## 2014-07-20 DIAGNOSIS — M40203 Unspecified kyphosis, cervicothoracic region: Secondary | ICD-10-CM | POA: Diagnosis not present

## 2014-07-20 DIAGNOSIS — R1310 Dysphagia, unspecified: Secondary | ICD-10-CM | POA: Diagnosis present

## 2014-07-20 DIAGNOSIS — Z981 Arthrodesis status: Secondary | ICD-10-CM | POA: Diagnosis not present

## 2014-07-20 DIAGNOSIS — K219 Gastro-esophageal reflux disease without esophagitis: Secondary | ICD-10-CM | POA: Diagnosis present

## 2014-07-20 DIAGNOSIS — F329 Major depressive disorder, single episode, unspecified: Secondary | ICD-10-CM | POA: Diagnosis present

## 2014-07-20 DIAGNOSIS — M963 Postlaminectomy kyphosis: Secondary | ICD-10-CM | POA: Diagnosis not present

## 2014-07-20 DIAGNOSIS — M40204 Unspecified kyphosis, thoracic region: Secondary | ICD-10-CM | POA: Diagnosis not present

## 2014-07-20 DIAGNOSIS — M40294 Other kyphosis, thoracic region: Secondary | ICD-10-CM | POA: Diagnosis present

## 2014-07-20 DIAGNOSIS — G8929 Other chronic pain: Secondary | ICD-10-CM | POA: Diagnosis not present

## 2014-08-01 DIAGNOSIS — M1990 Unspecified osteoarthritis, unspecified site: Secondary | ICD-10-CM | POA: Diagnosis not present

## 2014-08-01 DIAGNOSIS — Z4789 Encounter for other orthopedic aftercare: Secondary | ICD-10-CM | POA: Diagnosis not present

## 2014-08-01 DIAGNOSIS — G8929 Other chronic pain: Secondary | ICD-10-CM | POA: Diagnosis not present

## 2014-08-01 DIAGNOSIS — M40204 Unspecified kyphosis, thoracic region: Secondary | ICD-10-CM | POA: Diagnosis not present

## 2014-08-01 DIAGNOSIS — G629 Polyneuropathy, unspecified: Secondary | ICD-10-CM | POA: Diagnosis not present

## 2014-08-01 DIAGNOSIS — F419 Anxiety disorder, unspecified: Secondary | ICD-10-CM | POA: Diagnosis not present

## 2014-08-02 DIAGNOSIS — Z883 Allergy status to other anti-infective agents status: Secondary | ICD-10-CM | POA: Diagnosis not present

## 2014-08-02 DIAGNOSIS — Z881 Allergy status to other antibiotic agents status: Secondary | ICD-10-CM | POA: Diagnosis not present

## 2014-08-02 DIAGNOSIS — R600 Localized edema: Secondary | ICD-10-CM | POA: Diagnosis not present

## 2014-08-02 DIAGNOSIS — Z9109 Other allergy status, other than to drugs and biological substances: Secondary | ICD-10-CM | POA: Diagnosis not present

## 2014-08-02 DIAGNOSIS — Z79899 Other long term (current) drug therapy: Secondary | ICD-10-CM | POA: Diagnosis not present

## 2014-08-02 DIAGNOSIS — G629 Polyneuropathy, unspecified: Secondary | ICD-10-CM | POA: Diagnosis not present

## 2014-08-02 DIAGNOSIS — Z91040 Latex allergy status: Secondary | ICD-10-CM | POA: Diagnosis not present

## 2014-08-02 DIAGNOSIS — I4891 Unspecified atrial fibrillation: Secondary | ICD-10-CM | POA: Diagnosis not present

## 2014-08-02 DIAGNOSIS — Z91041 Radiographic dye allergy status: Secondary | ICD-10-CM | POA: Diagnosis not present

## 2014-08-02 DIAGNOSIS — Z882 Allergy status to sulfonamides status: Secondary | ICD-10-CM | POA: Diagnosis not present

## 2014-08-02 DIAGNOSIS — Z87891 Personal history of nicotine dependence: Secondary | ICD-10-CM | POA: Diagnosis not present

## 2014-08-02 DIAGNOSIS — Z886 Allergy status to analgesic agent status: Secondary | ICD-10-CM | POA: Diagnosis not present

## 2014-08-03 DIAGNOSIS — Z4789 Encounter for other orthopedic aftercare: Secondary | ICD-10-CM | POA: Diagnosis not present

## 2014-08-03 DIAGNOSIS — M1990 Unspecified osteoarthritis, unspecified site: Secondary | ICD-10-CM | POA: Diagnosis not present

## 2014-08-03 DIAGNOSIS — F419 Anxiety disorder, unspecified: Secondary | ICD-10-CM | POA: Diagnosis not present

## 2014-08-03 DIAGNOSIS — G629 Polyneuropathy, unspecified: Secondary | ICD-10-CM | POA: Diagnosis not present

## 2014-08-03 DIAGNOSIS — G8929 Other chronic pain: Secondary | ICD-10-CM | POA: Diagnosis not present

## 2014-08-03 DIAGNOSIS — M40204 Unspecified kyphosis, thoracic region: Secondary | ICD-10-CM | POA: Diagnosis not present

## 2014-08-06 ENCOUNTER — Emergency Department (HOSPITAL_COMMUNITY): Payer: Medicare Other

## 2014-08-06 ENCOUNTER — Inpatient Hospital Stay (HOSPITAL_COMMUNITY)
Admission: EM | Admit: 2014-08-06 | Discharge: 2014-08-09 | DRG: 948 | Disposition: A | Discharge status: Home / Self Care | Payer: Medicare Other | Attending: Internal Medicine | Admitting: Internal Medicine

## 2014-08-06 ENCOUNTER — Encounter (HOSPITAL_COMMUNITY): Payer: Self-pay | Admitting: *Deleted

## 2014-08-06 DIAGNOSIS — H8110 Benign paroxysmal vertigo, unspecified ear: Secondary | ICD-10-CM | POA: Diagnosis present

## 2014-08-06 DIAGNOSIS — G629 Polyneuropathy, unspecified: Secondary | ICD-10-CM | POA: Diagnosis not present

## 2014-08-06 DIAGNOSIS — R8290 Unspecified abnormal findings in urine: Secondary | ICD-10-CM | POA: Diagnosis not present

## 2014-08-06 DIAGNOSIS — N943 Premenstrual tension syndrome: Secondary | ICD-10-CM | POA: Diagnosis present

## 2014-08-06 DIAGNOSIS — Z981 Arthrodesis status: Secondary | ICD-10-CM | POA: Diagnosis not present

## 2014-08-06 DIAGNOSIS — J9811 Atelectasis: Secondary | ICD-10-CM | POA: Diagnosis not present

## 2014-08-06 DIAGNOSIS — I482 Chronic atrial fibrillation: Secondary | ICD-10-CM | POA: Diagnosis present

## 2014-08-06 DIAGNOSIS — Z881 Allergy status to other antibiotic agents status: Secondary | ICD-10-CM

## 2014-08-06 DIAGNOSIS — J90 Pleural effusion, not elsewhere classified: Secondary | ICD-10-CM | POA: Diagnosis not present

## 2014-08-06 DIAGNOSIS — Z888 Allergy status to other drugs, medicaments and biological substances status: Secondary | ICD-10-CM | POA: Diagnosis not present

## 2014-08-06 DIAGNOSIS — I739 Peripheral vascular disease, unspecified: Secondary | ICD-10-CM | POA: Diagnosis present

## 2014-08-06 DIAGNOSIS — S2243XA Multiple fractures of ribs, bilateral, initial encounter for closed fracture: Secondary | ICD-10-CM | POA: Diagnosis present

## 2014-08-06 DIAGNOSIS — Z90710 Acquired absence of both cervix and uterus: Secondary | ICD-10-CM | POA: Diagnosis not present

## 2014-08-06 DIAGNOSIS — Z87891 Personal history of nicotine dependence: Secondary | ICD-10-CM

## 2014-08-06 DIAGNOSIS — Z87442 Personal history of urinary calculi: Secondary | ICD-10-CM

## 2014-08-06 DIAGNOSIS — S2243XD Multiple fractures of ribs, bilateral, subsequent encounter for fracture with routine healing: Secondary | ICD-10-CM | POA: Diagnosis not present

## 2014-08-06 DIAGNOSIS — Z91041 Radiographic dye allergy status: Secondary | ICD-10-CM

## 2014-08-06 DIAGNOSIS — E876 Hypokalemia: Secondary | ICD-10-CM | POA: Diagnosis present

## 2014-08-06 DIAGNOSIS — I480 Paroxysmal atrial fibrillation: Secondary | ICD-10-CM | POA: Diagnosis present

## 2014-08-06 DIAGNOSIS — N94819 Vulvodynia, unspecified: Secondary | ICD-10-CM | POA: Diagnosis present

## 2014-08-06 DIAGNOSIS — Z882 Allergy status to sulfonamides status: Secondary | ICD-10-CM

## 2014-08-06 DIAGNOSIS — D509 Iron deficiency anemia, unspecified: Secondary | ICD-10-CM | POA: Diagnosis not present

## 2014-08-06 DIAGNOSIS — M7989 Other specified soft tissue disorders: Secondary | ICD-10-CM | POA: Diagnosis not present

## 2014-08-06 DIAGNOSIS — M545 Low back pain, unspecified: Secondary | ICD-10-CM | POA: Diagnosis present

## 2014-08-06 DIAGNOSIS — M8580 Other specified disorders of bone density and structure, unspecified site: Secondary | ICD-10-CM | POA: Diagnosis present

## 2014-08-06 DIAGNOSIS — R079 Chest pain, unspecified: Secondary | ICD-10-CM | POA: Diagnosis not present

## 2014-08-06 DIAGNOSIS — D519 Vitamin B12 deficiency anemia, unspecified: Secondary | ICD-10-CM | POA: Diagnosis present

## 2014-08-06 DIAGNOSIS — S2239XA Fracture of one rib, unspecified side, initial encounter for closed fracture: Secondary | ICD-10-CM

## 2014-08-06 DIAGNOSIS — R600 Localized edema: Principal | ICD-10-CM | POA: Diagnosis present

## 2014-08-06 DIAGNOSIS — R0602 Shortness of breath: Secondary | ICD-10-CM | POA: Diagnosis present

## 2014-08-06 DIAGNOSIS — D649 Anemia, unspecified: Secondary | ICD-10-CM

## 2014-08-06 DIAGNOSIS — S2249XA Multiple fractures of ribs, unspecified side, initial encounter for closed fracture: Secondary | ICD-10-CM | POA: Diagnosis present

## 2014-08-06 DIAGNOSIS — F419 Anxiety disorder, unspecified: Secondary | ICD-10-CM | POA: Diagnosis present

## 2014-08-06 DIAGNOSIS — Z85820 Personal history of malignant melanoma of skin: Secondary | ICD-10-CM | POA: Diagnosis not present

## 2014-08-06 DIAGNOSIS — R791 Abnormal coagulation profile: Secondary | ICD-10-CM | POA: Diagnosis not present

## 2014-08-06 DIAGNOSIS — R030 Elevated blood-pressure reading, without diagnosis of hypertension: Secondary | ICD-10-CM | POA: Diagnosis present

## 2014-08-06 DIAGNOSIS — D62 Acute posthemorrhagic anemia: Secondary | ICD-10-CM | POA: Diagnosis present

## 2014-08-06 DIAGNOSIS — I4891 Unspecified atrial fibrillation: Secondary | ICD-10-CM | POA: Diagnosis present

## 2014-08-06 DIAGNOSIS — R0600 Dyspnea, unspecified: Secondary | ICD-10-CM

## 2014-08-06 DIAGNOSIS — R000 Tachycardia, unspecified: Secondary | ICD-10-CM

## 2014-08-06 DIAGNOSIS — K219 Gastro-esophageal reflux disease without esophagitis: Secondary | ICD-10-CM | POA: Diagnosis present

## 2014-08-06 LAB — CBC WITH DIFFERENTIAL/PLATELET
Basophils Absolute: 0 10*3/uL (ref 0.0–0.1)
Basophils Relative: 0 % (ref 0–1)
Eosinophils Absolute: 0.1 10*3/uL (ref 0.0–0.7)
Eosinophils Relative: 1 % (ref 0–5)
HCT: 28.6 % — ABNORMAL LOW (ref 36.0–46.0)
Hemoglobin: 9 g/dL — ABNORMAL LOW (ref 12.0–15.0)
Lymphocytes Relative: 15 % (ref 12–46)
Lymphs Abs: 0.9 10*3/uL (ref 0.7–4.0)
MCH: 27.8 pg (ref 26.0–34.0)
MCHC: 31.5 g/dL (ref 30.0–36.0)
MCV: 88.3 fL (ref 78.0–100.0)
Monocytes Absolute: 0.4 10*3/uL (ref 0.1–1.0)
Monocytes Relative: 7 % (ref 3–12)
Neutro Abs: 4.6 10*3/uL (ref 1.7–7.7)
Neutrophils Relative %: 77 % (ref 43–77)
Platelets: 327 10*3/uL (ref 150–400)
RBC: 3.24 MIL/uL — ABNORMAL LOW (ref 3.87–5.11)
RDW: 15 % (ref 11.5–15.5)
WBC: 5.9 10*3/uL (ref 4.0–10.5)

## 2014-08-06 LAB — TROPONIN I: Troponin I: 0.03 ng/mL (ref <0.031)

## 2014-08-06 LAB — BASIC METABOLIC PANEL
Anion gap: 10 (ref 5–15)
BUN: 8 mg/dL (ref 6–23)
CO2: 26 mmol/L (ref 19–32)
Calcium: 8.2 mg/dL — ABNORMAL LOW (ref 8.4–10.5)
Chloride: 103 mmol/L (ref 96–112)
Creatinine, Ser: 0.52 mg/dL (ref 0.50–1.10)
GFR calc Af Amer: >90 mL/min (ref >90)
GFR calc non Af Amer: >90 mL/min (ref >90)
Glucose, Bld: 94 mg/dL (ref 70–99)
Potassium: 3 mmol/L — ABNORMAL LOW (ref 3.5–5.1)
Sodium: 139 mmol/L (ref 135–145)

## 2014-08-06 LAB — D-DIMER, QUANTITATIVE: D-Dimer, Quant: 6.06 ug/mL-FEU — ABNORMAL HIGH (ref 0.00–0.48)

## 2014-08-06 LAB — URINALYSIS, ROUTINE W REFLEX MICROSCOPIC
Bilirubin Urine: NEGATIVE
Glucose, UA: NEGATIVE mg/dL
Hgb urine dipstick: NEGATIVE
Ketones, ur: NEGATIVE mg/dL
Leukocytes, UA: NEGATIVE
Nitrite: NEGATIVE
Protein, ur: NEGATIVE mg/dL
Specific Gravity, Urine: 1.006 (ref 1.005–1.030)
Urobilinogen, UA: 0.2 mg/dL (ref 0.0–1.0)
pH: 7.5 (ref 5.0–8.0)

## 2014-08-06 LAB — I-STAT TROPONIN, ED: Troponin i, poc: 0.02 ng/mL (ref 0.00–0.08)

## 2014-08-06 LAB — TSH: TSH: 2.381 u[IU]/mL (ref 0.350–4.500)

## 2014-08-06 LAB — BRAIN NATRIURETIC PEPTIDE: B Natriuretic Peptide: 133.3 pg/mL — ABNORMAL HIGH (ref 0.0–100.0)

## 2014-08-06 MED ORDER — CARVEDILOL 3.125 MG PO TABS
3.1250 mg | ORAL_TABLET | Freq: Two times a day (BID) | ORAL | Status: DC
  Administered 2014-08-07 – 2014-08-09 (×5): 3.125 mg via ORAL
  Filled 2014-08-06 (×7): qty 1

## 2014-08-06 MED ORDER — FUROSEMIDE 10 MG/ML IJ SOLN
40.0000 mg | Freq: Every day | INTRAMUSCULAR | Status: DC
Start: 2014-08-06 — End: 2014-08-07
  Administered 2014-08-06 – 2014-08-07 (×2): 40 mg via INTRAVENOUS
  Filled 2014-08-06 (×2): qty 4

## 2014-08-06 MED ORDER — SODIUM CHLORIDE 0.9 % IJ SOLN
3.0000 mL | Freq: Two times a day (BID) | INTRAMUSCULAR | Status: DC
  Administered 2014-08-07 – 2014-08-08 (×2): 3 mL via INTRAVENOUS

## 2014-08-06 MED ORDER — SODIUM CHLORIDE 0.9 % IJ SOLN: 3.0000 mL | INTRAMUSCULAR | Status: DC | PRN

## 2014-08-06 MED ORDER — POTASSIUM CHLORIDE CRYS ER 20 MEQ PO TBCR
40.0000 meq | EXTENDED_RELEASE_TABLET | Freq: Once | ORAL | Status: AC
  Administered 2014-08-06: 40 meq via ORAL
  Filled 2014-08-06: qty 2

## 2014-08-06 MED ORDER — SODIUM CHLORIDE 0.9 % IJ SOLN
3.0000 mL | Freq: Two times a day (BID) | INTRAMUSCULAR | Status: DC
  Administered 2014-08-07 – 2014-08-09 (×4): 3 mL via INTRAVENOUS

## 2014-08-06 MED ORDER — FUROSEMIDE 10 MG/ML IJ SOLN
40.0000 mg | Freq: Once | INTRAMUSCULAR | Status: AC
  Administered 2014-08-06: 40 mg via INTRAVENOUS
  Filled 2014-08-06: qty 4

## 2014-08-06 MED ORDER — POLYETHYLENE GLYCOL 3350 17 G PO PACK
17.0000 g | PACK | ORAL | Status: DC
  Administered 2014-08-06 – 2014-08-08 (×2): 17 g via ORAL
  Filled 2014-08-06 (×2): qty 1

## 2014-08-06 MED ORDER — SODIUM CHLORIDE 0.9 % IV SOLN
250.0000 mL | INTRAVENOUS | Status: DC | PRN
  Administered 2014-08-08: 250 mL via INTRAVENOUS

## 2014-08-06 MED ORDER — OXYCODONE HCL 5 MG PO TABS
2.5000 mg | ORAL_TABLET | ORAL | Status: DC | PRN
  Administered 2014-08-07 – 2014-08-09 (×9): 2.5 mg via ORAL
  Filled 2014-08-06 (×10): qty 1

## 2014-08-06 MED ORDER — PANTOPRAZOLE SODIUM 20 MG PO TBEC
20.0000 mg | DELAYED_RELEASE_TABLET | Freq: Every day | ORAL | Status: DC
  Administered 2014-08-06 – 2014-08-09 (×4): 20 mg via ORAL
  Filled 2014-08-06 (×4): qty 1

## 2014-08-06 MED ORDER — ACETAMINOPHEN 325 MG PO TABS
650.0000 mg | ORAL_TABLET | Freq: Four times a day (QID) | ORAL | Status: DC | PRN
  Administered 2014-08-08 – 2014-08-09 (×2): 650 mg via ORAL
  Filled 2014-08-06 (×2): qty 2

## 2014-08-06 MED ORDER — ACETAMINOPHEN 650 MG RE SUPP: 650.0000 mg | Freq: Four times a day (QID) | RECTAL | Status: DC | PRN

## 2014-08-06 MED ORDER — CLONAZEPAM 0.5 MG PO TABS
0.2500 mg | ORAL_TABLET | Freq: Two times a day (BID) | ORAL | Status: DC | PRN
  Administered 2014-08-07 – 2014-08-09 (×5): 0.25 mg via ORAL
  Filled 2014-08-06 (×6): qty 1

## 2014-08-06 MED ORDER — TAMSULOSIN HCL 0.4 MG PO CAPS
0.4000 mg | ORAL_CAPSULE | Freq: Every day | ORAL | Status: DC
  Administered 2014-08-07 – 2014-08-09 (×3): 0.4 mg via ORAL
  Filled 2014-08-06 (×3): qty 1

## 2014-08-06 NOTE — ED Provider Notes (Signed)
CSN: [id number]     Arrival date & time 08/06/14  1716 History   First MD Initiated Contact with Patient 08/06/14 1731     Chief Complaint  Patient presents with  . Leg Swelling  . Shortness of Breath    Patient is a 69 y.o. female presenting with shortness of breath. The history is provided by the patient and a relative.  Shortness of Breath Severity:  Moderate Onset quality:  Gradual Timing:  Constant Progression:  Worsening Chronicity:  New Relieved by:  Rest Exacerbated by: movement. Associated symptoms: neck pain   Associated symptoms: no chest pain, no fever and no vomiting   Patient presents with shortness of breath and LE edema, worse over past several days She reports recent complicated spinal/scoliosis surgery, one was performed in December, the other performed in January.  She reports she has LE edema during this period She reports increasing SOB She also reports continued neck and back pain No fever No vomiting She denies known h/o CAD/CHF  Past Medical History  Diagnosis Date  . Vaginal delivery     ONE NSVD  . Vulvodynia   . PMDD (premenstrual dysphoric disorder)   . IC (intermittent claudication)   . Atrial fibrillation   . Melanoma of ankle   . Rotator cuff tear   . Melanoma   . Kidney stone   . Heart disease     noted A Fib once  . GERD (gastroesophageal reflux disease)   . Herpes   . Allergy   . Anxiety   . Benign paroxysmal positional vertigo 06/08/2013  . Peripheral neuropathy 11/06/2011  . Hair loss 04/12/2012  . Osteopenia 02/18/2012  . 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 Interstitial cystitis 11/06/2011  . Fracture of multiple ribs    Past Surgical History  Procedure Laterality Date  . Vaginal hysterectomy      TVH  . Back surgery      DISC FUSION L5,L6,L7  . Bunionectomy    . Head & neck skin lesion excisional biopsy      melanoma  . Bladder surgery    . Rotator cuff repair  2012    RIGHT  . Breast surgery      BREAST BIOPSY--RIGHT BENIGN  .  Spinal fusion  dec 2015    Family History  Problem Relation Age of Onset  . Diabetes Father   . Hyperlipidemia Sister   . Heart disease Sister   . Stroke Sister   . Diabetes Brother   . Hyperlipidemia Sister   . Heart disease Sister   . Arthritis Mother   . Cancer Mother     uterus  . Heart disease Mother   . Diabetes Brother   . Heart Problems Brother    History  Substance Use Topics  . Smoking status: Former Smoker -- 0.25 packs/day for 14 years    Types: Cigarettes  . Smokeless tobacco: Not on file  . Alcohol Use: No   OB History    Gravida Para Term Preterm AB TAB SAB Ectopic Multiple Living   '1 1 1       1     ' Review of Systems  Constitutional: Positive for fatigue. Negative for fever.  Respiratory: Positive for shortness of breath.   Cardiovascular: Negative for chest pain.  Gastrointestinal: Negative for vomiting.  Musculoskeletal: Positive for neck pain.  All other systems reviewed and are negative.     Allergies  Contrast media; Erythromycin; Latex; Levofloxacin; Other; Septra; and Sulfa antibiotics  Home Medications  Prior to Admission medications   Medication Sig Start Date End Date Taking? Authorizing Provider  acetaminophen (TYLENOL) 500 MG tablet Take 1,000 mg by mouth every 12 (twelve) hours.    Historical Provider, MD  calcium carbonate (OS-CAL) 600 MG TABS tablet Take 600 mg by mouth daily with breakfast.    Historical Provider, MD  Calcium Glycerophosphate 340 (65-50) MG (CA-P) TABS Take 1 tablet by mouth daily.    Historical Provider, MD  cefpodoxime (VANTIN) 200 MG tablet Take 200 mg by mouth daily.    Historical Provider, MD  cholecalciferol (VITAMIN D) 1000 UNITS tablet Take 1,000 Units by mouth daily.    Historical Provider, MD  clonazePAM (KLONOPIN) 0.5 MG tablet Take 0.25 mg by mouth 2 (two) times daily as needed for anxiety.    Historical Provider, MD  Cyanocobalamin 1000 MCG/ML KIT Inject 1 mL as directed every 30 (thirty) days.     Historical Provider, MD  fexofenadine (ALLEGRA) 180 MG tablet Take 180 mg by mouth daily.    Historical Provider, MD  fluticasone (FLONASE) 50 MCG/ACT nasal spray Place 2 sprays into both nostrils daily.    Historical Provider, MD  methocarbamol (ROBAXIN) 500 MG tablet Take 1 tablet (500 mg total) by mouth 3 (three) times daily. 07/14/14   Gerlene Fee, NP  OxyCODONE (OXYCONTIN) 20 mg T12A 12 hr tablet Take 20 mg by mouth every 12 (twelve) hours.    Historical Provider, MD  OxyCODONE HCl, Abuse Deter, 5 MG TABA Take 5 mg by mouth every 4 (four) hours. Along with tid prn 07/14/14   Gerlene Fee, NP  pantoprazole (PROTONIX) 20 MG tablet Take 20 mg by mouth daily.    Historical Provider, MD  polyethylene glycol (MIRALAX / GLYCOLAX) packet Take 17 g by mouth daily.    Historical Provider, MD   BP 134/79 mmHg  Pulse 89  Temp(Src) 98.1 F (36.7 C) (Oral)  Resp 23  Ht '5\' 7"'  (1.702 m)  Wt 137 lb (62.143 kg)  BMI 21.45 kg/m2  SpO2 100% Physical Exam CONSTITUTIONAL: frail, elderly, HEAD: Normocephalic/atraumatic EYES: EOMI/PERRL ENMT: Mucous membranes moist NECK: cervical collar in place SPINE/BACK:well healing incision noted to back CV: S1/S2 noted, murmur noted LUNGS: tachypneic, coarse BS noted bilaterally ABDOMEN: soft, nontender, no rebound or guarding, bowel sounds noted throughout abdomen GU:no cva tenderness NEURO: Pt is awake/alert/appropriate, moves all extremitiesx4.   EXTREMITIES: pulses normal/equal, full ROM. 3+ pitting edema to bilateral LE SKIN: warm, color normal PSYCH: anxious  ED Course  Procedures   6:46 PM Pt with recent spinal/scoliosis surgery (Y7-X4) that was complicated by rib fractures and urinary retention She presents with continued LE edema and dyspnea Per care everywhere - patient with recent negative DVT study (1/27) and had echo earlier in the month (1/7) (EF at 60%) but it doesn't appear an echo was done after most recent surgery Labs pending at this  time 8:52 PM Will admit for further workup - would benefit from repeat echo to evaluate for any possible CHF I doubt PE as recent DVT study negative Her work of breathing may be due to subacute rib fractures  D/w Dr Maudie Mercury will admit for observation/monitoring Lasix ordered for diuresis  Labs Review Labs Reviewed  BASIC METABOLIC PANEL - Abnormal; Notable for the following:    Potassium 3.0 (*)    Calcium 8.2 (*)    All other components within normal limits  CBC WITH DIFFERENTIAL/PLATELET - Abnormal; Notable for the following:    RBC 3.24 (*)  Hemoglobin 9.0 (*)    HCT 28.6 (*)    All other components within normal limits  BRAIN NATRIURETIC PEPTIDE - Abnormal; Notable for the following:    B Natriuretic Peptide 133.3 (*)    All other components within normal limits  I-STAT TROPOININ, ED    Imaging Review Dg Chest Portable 1 View  08/06/2014   CLINICAL DATA:  Status post surgery for scoliosis 06/09/2014 and 07/14/2014. Shortness of breath and lower extremity swelling.  EXAM: PORTABLE CHEST - 1 VIEW  COMPARISON:  Single view of the chest 04/24/2011.  FINDINGS: New extensive spinal stabilization hardware is in place. Surgical staples are identified in the midline. There are small bilateral pleural effusions and basilar atelectasis. No pneumothorax is identified. Multiple rib fractures are seen including the right fourth through eighth and left fourth and fifth ribs. The fractures appear acute or subacute.  IMPRESSION: Bilateral acute or subacute rib fractures. Negative for pneumothorax.  Small bilateral pleural effusions and mild basilar atelectasis.   Electronically Signed   By: Inge Rise M.D.   On: 08/06/2014 18:01     EKG Interpretation   Date/Time:  Sunday August 06 2014 17:54:06 EST Ventricular Rate:  101 PR Interval:  125 QRS Duration: 78 QT Interval:  372 QTC Calculation: 482 R Axis:   71 Text Interpretation:  Sinus tachycardia Atrial premature complex artifact   noted Confirmed by Christy Gentles  MD, Elenore Rota ([id number]) on 08/06/2014 6:11:28 PM     Medications  furosemide (LASIX) injection 40 mg (40 mg Intravenous Given 08/06/14 1950)    MDM   Final diagnoses:  Dyspnea  Bilateral lower extremity edema  Rib fractures, unspecified laterality, closed, initial encounter    Nursing notes including past medical history and social history reviewed and considered in documentation xrays/imaging reviewed by myself and considered during evaluation Previous records reviewed and considered Labs/vital reviewed myself and considered during evaluation     Sharyon Cable, MD 08/06/14 2054

## 2014-08-06 NOTE — ED Notes (Signed)
Ambulated to bathroom at this time.  Good steady gait noted.

## 2014-08-06 NOTE — H&P (Addendum)
[name] is an 69 y.o. female.    Pcp:   ? [place] (cardiologist)  Chief Complaint: edema HPI: 69 yo female with hx of afib (chads2 score =2), apparently c//o lower ext swelling x 1 week, since discharge from [facility].  + slight weight gain, + dyspnea, denies fever, chills, cough, pnd, orthopnea.  Pt presented to ED tonight due to her lower ext edema worsening.  CXR negative for CHF.  Trop i negative.  Pt noted to have significant anemia.  Pt noted to be tachycardic.  Pt will be admitted for iv lasix for edema. And evaluation of tachycardia.   Past Medical History  Diagnosis Date  . Vaginal delivery     ONE NSVD  . Vulvodynia   . PMDD (premenstrual dysphoric disorder)   . IC (intermittent claudication)   . Atrial fibrillation   . Melanoma of ankle   . Rotator cuff tear   . Melanoma   . Kidney stone   . Heart disease     noted A Fib once  . GERD (gastroesophageal reflux disease)   . Herpes   . Allergy   . Anxiety   . Benign paroxysmal positional vertigo 06/08/2013  . Peripheral neuropathy 11/06/2011  . Hair loss 04/12/2012  . Osteopenia 02/18/2012  . 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 Interstitial cystitis 11/06/2011  . Fracture of multiple ribs     Past Surgical History  Procedure Laterality Date  . Vaginal hysterectomy      TVH  . Back surgery      DISC FUSION L5,L6,L7  . Bunionectomy    . Head & neck skin lesion excisional biopsy      melanoma  . Bladder surgery      Bladder sling  . Rotator cuff repair  2012    RIGHT  . Breast surgery      BREAST BIOPSY--RIGHT BENIGN  . Spinal fusion  dec 2015     Family History  Problem Relation Age of Onset  . Diabetes Father   . Hyperlipidemia Sister   . Heart disease Sister   . Stroke Sister   . Diabetes Brother   . Hyperlipidemia Sister   . Heart disease Sister   . Arthritis Mother   . Cancer Mother     uterus  . Heart disease Mother   . Diabetes Brother   . Heart Problems Brother    Social History:   reports that she quit smoking about 30 years ago. Her smoking use included Cigarettes. She has a 3.5 pack-year smoking history. She does not have any smokeless tobacco history on file. She reports that she does not drink alcohol or use illicit drugs.  Allergies:  Allergies  Allergen Reactions  . Contrast Media [Iodinated Diagnostic Agents]     unknown  . Erythromycin     unknown  . Latex     hives  . Levofloxacin Nausea And Vomiting  . Other     IV CONTRAST /"DYE".  . Septra [Bactrim]     Hives   . Sulfa Antibiotics     rash   Medications reviewed   (Not in a hospital admission)  Results for orders placed or performed during the hospital encounter of 08/06/14 (from the past 48 hour(s))  Basic metabolic panel     Status: Abnormal   Collection Time: 08/06/14  6:08 PM  Result Value Ref Range   Sodium 139 135 - 145 mmol/L   Potassium 3.0 (L) 3.5 - 5.1 mmol/L  Chloride 103 96 - 112 mmol/L   CO2 26 19 - 32 mmol/L   Glucose, Bld 94 70 - 99 mg/dL   BUN 8 6 - 23 mg/dL   Creatinine, Ser 0.52 0.50 - 1.10 mg/dL   Calcium 8.2 (L) 8.4 - 10.5 mg/dL   GFR calc non Af Amer >90 >90 mL/min   GFR calc Af Amer >90 >90 mL/min    Comment: (NOTE) The eGFR has been calculated using the CKD EPI equation. This calculation has not been validated in all clinical situations. eGFR's persistently <90 mL/min signify possible Chronic Kidney Disease.    Anion gap 10 5 - 15  CBC with Differential/Platelet     Status: Abnormal   Collection Time: 08/06/14  6:08 PM  Result Value Ref Range   WBC 5.9 4.0 - 10.5 K/uL   RBC 3.24 (L) 3.87 - 5.11 MIL/uL   Hemoglobin 9.0 (L) 12.0 - 15.0 g/dL   HCT 28.6 (L) 36.0 - 46.0 %   MCV 88.3 78.0 - 100.0 fL   MCH 27.8 26.0 - 34.0 pg   MCHC 31.5 30.0 - 36.0 g/dL   RDW 15.0 11.5 - 15.5 %   Platelets 327 150 - 400 K/uL   Neutrophils Relative % 77 43 - 77 %   Neutro Abs 4.6 1.7 - 7.7 K/uL   Lymphocytes Relative 15 12 - 46 %   Lymphs Abs 0.9 0.7 - 4.0 K/uL    Monocytes Relative 7 3 - 12 %   Monocytes Absolute 0.4 0.1 - 1.0 K/uL   Eosinophils Relative 1 0 - 5 %   Eosinophils Absolute 0.1 0.0 - 0.7 K/uL   Basophils Relative 0 0 - 1 %   Basophils Absolute 0.0 0.0 - 0.1 K/uL  Brain natriuretic peptide     Status: Abnormal   Collection Time: 08/06/14  6:08 PM  Result Value Ref Range   B Natriuretic Peptide 133.3 (H) 0.0 - 100.0 pg/mL  I-stat troponin, ED     Status: None   Collection Time: 08/06/14  6:36 PM  Result Value Ref Range   Troponin i, poc 0.02 0.00 - 0.08 ng/mL   Comment 3            Comment: Due to the release kinetics of cTnI, a negative result within the first hours of the onset of symptoms does not rule out myocardial infarction with certainty. If myocardial infarction is still suspected, repeat the test at appropriate intervals.    Dg Chest Portable 1 View  08/06/2014   CLINICAL DATA:  Status post surgery for scoliosis 06/09/2014 and 07/14/2014. Shortness of breath and lower extremity swelling.  EXAM: PORTABLE CHEST - 1 VIEW  COMPARISON:  Single view of the chest 04/24/2011.  FINDINGS: New extensive spinal stabilization hardware is in place. Surgical staples are identified in the midline. There are small bilateral pleural effusions and basilar atelectasis. No pneumothorax is identified. Multiple rib fractures are seen including the right fourth through eighth and left fourth and fifth ribs. The fractures appear acute or subacute.  IMPRESSION: Bilateral acute or subacute rib fractures. Negative for pneumothorax.  Small bilateral pleural effusions and mild basilar atelectasis.   Electronically Signed   By: Inge Rise M.D.   On: 08/06/2014 18:01    Review of Systems  Constitutional: Negative for fever, chills, weight loss, malaise/fatigue and diaphoresis.  HENT: Negative for congestion, ear discharge, ear pain, hearing loss, nosebleeds, sore throat and tinnitus.   Eyes: Negative for blurred vision, double vision,  photophobia,  pain, discharge and redness.  Respiratory: Positive for shortness of breath. Negative for cough, hemoptysis, sputum production, wheezing and stridor.   Cardiovascular: Positive for leg swelling. Negative for chest pain, palpitations, orthopnea, claudication and PND.  Gastrointestinal: Negative for heartburn, nausea, vomiting, abdominal pain, diarrhea, constipation, blood in stool and melena.  Genitourinary: Negative for dysuria, urgency, frequency, hematuria and flank pain.  Musculoskeletal: Positive for back pain. Negative for myalgias, joint pain, falls and neck pain.  Skin: Negative for itching and rash.  Neurological: Negative for dizziness, tingling, tremors, speech change, focal weakness, seizures, loss of consciousness, weakness and headaches.  Endo/Heme/Allergies: Negative for environmental allergies and polydipsia. Does not bruise/bleed easily.  Psychiatric/Behavioral: Negative for depression, suicidal ideas, hallucinations, memory loss and substance abuse. The patient is not nervous/anxious and does not have insomnia.     Blood pressure 143/89, pulse 105, temperature 98.1 F (36.7 C), temperature source Oral, resp. rate 23, height '5\' 7"'  (1.702 m), weight 62.143 kg (137 lb), SpO2 100 %. Physical Exam  Constitutional: She appears well-developed and well-nourished.  HENT:  Head: Normocephalic and atraumatic.  Eyes: Conjunctivae and EOM are normal. Pupils are equal, round, and reactive to light.  Neck: Normal range of motion. Neck supple. No JVD present. No tracheal deviation present. No thyromegaly present.  Cardiovascular:  Tachy s1, s2, 2/6 sem apex  Respiratory: Effort normal and breath sounds normal. No respiratory distress. She has no wheezes. She has no rales. She exhibits no tenderness.  GI: Soft. Bowel sounds are normal. She exhibits no distension and no mass. There is no tenderness. There is no rebound and no guarding.  Musculoskeletal: Normal range of motion. She exhibits  edema. She exhibits no tenderness.  Lymphadenopathy:    She has no cervical adenopathy.  Neurological: She is alert. She has normal reflexes. She displays normal reflexes. No cranial nerve deficit. She exhibits normal muscle tone. Coordination normal.  Pt able to walk by herself w/ assistance  Skin: Skin is warm and dry. No rash noted. No erythema. No pallor.  Staples on the back side of neck  Psychiatric: She has a normal mood and affect. Her behavior is normal. Judgment and thought content normal.     Assessment/Plan LE edema Check ua, check lft, check tsh Check ultrasound r/o DVT Check cardiac echo Lasix 36m iv qday  Tachycardia Cycle cardiac markers Check tsh Check cardiac echo VQ scan  Start carvedilol 3.1269mpo bid  Elevated bp Start carvedilol 3.12518mo bid  Pafib (CHADS2vasc=2) Lovenox 1mg70m [place] x1  Dyspnea  Check d dimer  If positive VQ scan in am  Hypokalemia kcl 40me39m x1 Check cmp in am  Anemia Check cbc in am Check iron studies, b12, folate, consider esr, spep, upep  Recent neck surgery Neck brace when out of bed please  Cipriano Millikan, Jani Gravel/2016, 8:43 PM

## 2014-08-06 NOTE — ED Notes (Signed)
Pt states she had surgery for her scoliosis on December 4, and January 8. Since the surgeries she reports bilateral leg swelling, shortness of breath. Pt presents with bilateral 3+ edema from knees down. Pt speaking in full and complete sentences, pulse ox 100% on RA. Pt has aspen collar placed due to surgery. States she takes it off during showers. Pt states surgery was performed at [facility]. Pt also reports broken ribs per Dr Shelda Pal from a XR performed this morning. Pt appears to be very anxious stating "I don't want to die" and "nobody at [facility] cares about me."

## 2014-08-06 NOTE — ED Notes (Signed)
Ambulated to bathroom without difficulty.  Good steady gait noted. Encouraged to call for assistance as needed,

## 2014-08-07 ENCOUNTER — Encounter (HOSPITAL_COMMUNITY): Payer: Self-pay | Admitting: Internal Medicine

## 2014-08-07 ENCOUNTER — Inpatient Hospital Stay (HOSPITAL_COMMUNITY): Payer: Medicare Other

## 2014-08-07 DIAGNOSIS — I480 Paroxysmal atrial fibrillation: Secondary | ICD-10-CM | POA: Diagnosis present

## 2014-08-07 DIAGNOSIS — I482 Chronic atrial fibrillation: Secondary | ICD-10-CM

## 2014-08-07 DIAGNOSIS — R000 Tachycardia, unspecified: Secondary | ICD-10-CM | POA: Insufficient documentation

## 2014-08-07 DIAGNOSIS — S2249XA Multiple fractures of ribs, unspecified side, initial encounter for closed fracture: Secondary | ICD-10-CM | POA: Insufficient documentation

## 2014-08-07 DIAGNOSIS — Z981 Arthrodesis status: Secondary | ICD-10-CM

## 2014-08-07 DIAGNOSIS — S2239XA Fracture of one rib, unspecified side, initial encounter for closed fracture: Secondary | ICD-10-CM

## 2014-08-07 LAB — COMPREHENSIVE METABOLIC PANEL
ALT: 9 U/L (ref 0–35)
AST: 22 U/L (ref 0–37)
Albumin: 2.4 g/dL — ABNORMAL LOW (ref 3.5–5.2)
Alkaline Phosphatase: 120 U/L — ABNORMAL HIGH (ref 39–117)
Anion gap: 7 (ref 5–15)
BUN: 6 mg/dL (ref 6–23)
CO2: 31 mmol/L (ref 19–32)
Calcium: 8 mg/dL — ABNORMAL LOW (ref 8.4–10.5)
Chloride: 103 mmol/L (ref 96–112)
Creatinine, Ser: 0.56 mg/dL (ref 0.50–1.10)
GFR calc Af Amer: >90 mL/min (ref >90)
GFR calc non Af Amer: >90 mL/min (ref >90)
Glucose, Bld: 88 mg/dL (ref 70–99)
Potassium: 3.2 mmol/L — ABNORMAL LOW (ref 3.5–5.1)
Sodium: 141 mmol/L (ref 135–145)
Total Bilirubin: 0.9 mg/dL (ref 0.3–1.2)
Total Protein: 5 g/dL — ABNORMAL LOW (ref 6.0–8.3)

## 2014-08-07 LAB — CBC
HCT: 27.1 % — ABNORMAL LOW (ref 36.0–46.0)
Hemoglobin: 8.5 g/dL — ABNORMAL LOW (ref 12.0–15.0)
MCH: 27.5 pg (ref 26.0–34.0)
MCHC: 31.4 g/dL (ref 30.0–36.0)
MCV: 87.7 fL (ref 78.0–100.0)
Platelets: 297 10*3/uL (ref 150–400)
RBC: 3.09 MIL/uL — ABNORMAL LOW (ref 3.87–5.11)
RDW: 15 % (ref 11.5–15.5)
WBC: 4.9 10*3/uL (ref 4.0–10.5)

## 2014-08-07 LAB — IRON AND TIBC
Iron: 22 ug/dL — ABNORMAL LOW (ref 42–145)
Saturation Ratios: 12 % — ABNORMAL LOW (ref 20–55)
TIBC: 177 ug/dL — ABNORMAL LOW (ref 250–470)
UIBC: 155 ug/dL (ref 125–400)

## 2014-08-07 LAB — BRAIN NATRIURETIC PEPTIDE: B Natriuretic Peptide: 72.6 pg/mL (ref 0.0–100.0)

## 2014-08-07 LAB — TROPONIN I
Troponin I: <0.03 ng/mL (ref <0.031)
Troponin I: 0.03 ng/mL (ref <0.031)

## 2014-08-07 LAB — FERRITIN: Ferritin: 194 ng/mL (ref 10–291)

## 2014-08-07 LAB — MRSA PCR SCREENING: MRSA by PCR: NEGATIVE

## 2014-08-07 LAB — MAGNESIUM: Magnesium: 1.9 mg/dL (ref 1.5–2.5)

## 2014-08-07 LAB — VITAMIN B12: Vitamin B-12: 401 pg/mL (ref 211–911)

## 2014-08-07 MED ORDER — FUROSEMIDE 10 MG/ML IJ SOLN
40.0000 mg | Freq: Two times a day (BID) | INTRAMUSCULAR | Status: DC
  Administered 2014-08-07 – 2014-08-09 (×4): 40 mg via INTRAVENOUS
  Filled 2014-08-07 (×5): qty 4

## 2014-08-07 MED ORDER — POTASSIUM CHLORIDE CRYS ER 20 MEQ PO TBCR
40.0000 meq | EXTENDED_RELEASE_TABLET | ORAL | Status: AC
  Administered 2014-08-07 (×2): 40 meq via ORAL
  Filled 2014-08-07 (×2): qty 2

## 2014-08-07 MED ORDER — TECHNETIUM TC 99M DIETHYLENETRIAME-PENTAACETIC ACID: 40.0000 | Freq: Once | INTRAVENOUS | Status: AC | PRN

## 2014-08-07 MED ORDER — TECHNETIUM TO 99M ALBUMIN AGGREGATED
6.0000 | Freq: Once | INTRAVENOUS | Status: AC | PRN
  Administered 2014-08-07: 6 via INTRAVENOUS

## 2014-08-07 MED ORDER — ENOXAPARIN SODIUM 40 MG/0.4ML ~~LOC~~ SOLN
40.0000 mg | SUBCUTANEOUS | Status: DC
  Administered 2014-08-07 – 2014-08-08 (×2): 40 mg via SUBCUTANEOUS
  Filled 2014-08-07 (×3): qty 0.4

## 2014-08-07 NOTE — Progress Notes (Signed)
[facility] PROGRESS NOTE  RONISHA HERRINGSHAW HAL:[id number] DOB: 26-Jan-1946 DOA: 08/06/2014 PCP: [name], MD  Assessment/Plan: #1 bilateral lower extremity edema  Questionable etiology. ??? secondary to volume resuscitation during recent surgery and transfusion of packed red blood cells as her hemoglobin had dropped to 6.8 during her prior hospitalization at [facility] vs 3rd spacing with low albumin. Patient's LFTs are within normal limits and out if this is secondary to liver disease. Patient's renal function is normal and urinalysis is negative for proteinuria. Patient is not on any calcium channel blockers. Cardiac enzymes negative 3. Pro BNP was 133.3. 2-D echo is currently pending. Continue Lasix 40 mg IV every 12 hours. Strict I's and O's. Daily weights. Lower extremity Dopplers pending to rule out DVT. Follow.  #2 dyspnea Questionable etiology. Likely secondary to subacute rib fractures. Chest x-ray was negative for any acute infiltrate. Patient however did have a elevated d-dimer and due to contrast allergy VQ scan has been ordered. VQ scan was done and is negative for PE. Patient with clinical improvement. Incentive spirometry. Follow.  #3 tachycardia Patient with history of atrial fibrillation. EKG with normal sinus rhythm. Cardiac markers negative 3. 2-D echo is pending. VQ scan negative for PE. TSH is within normal limits at 2.381. Tachycardia has improved. Continue current dose of Coreg.  #4 anemia/acute blood loss anemia Patient with recent surgery with recent acute blood loss anemia during her prior hospitalization at [facility]. Patient's hemoglobin over the dropped as low as 6.8. Patient with no overt bleeding. Patient's hemoglobin is currently at 8.5. Anemia panel is pending. Follow H&H.  #5 elevated blood pressure Continue Coreg.  #6 history of atrial fibrillation Chads2VASC score is 2. Continue Coreg for rate control. Will hold off on aspirin for now  secondary to recent spinal fusion surgery.  #7 hypokalemia Replete. Check a magnesium level.  #8 status post spinal surgery Stable. Continue cervical collar. Outpatient follow-up.  #9 bilateral rib fractures Pain management. Incentive spirometry.  #10 prophylaxis Lovenox for DVT prophylaxis.   Code Status: Full Family Communication: Updated patient and assist at bedside. Disposition Plan: Home when medically stable and edema is resolved.   Consultants:  None  Procedures:  Chest x-ray 08/06/2014  V/Q scan 08/07/2014  Antibiotics:  None  HPI/Subjective: Patient denies any shortness of breath. Patient denies any chest pain. Patient states sensation of palpitations has resolved.  Objective: Filed Vitals:   08/07/14 0919  BP: 115/66  Pulse: 87  Temp: 97.3 F (36.3 C)  Resp: 18    Intake/Output Summary (Last 24 hours) at 08/07/14 1650 Last data filed at 08/07/14 1253  Gross per 24 hour  Intake    240 ml  Output   4665 ml  Net  -4425 ml   Filed Weights   08/06/14 1720 08/06/14 2118  Weight: 62.143 kg (137 lb) 61.145 kg (134 lb 12.8 oz)    Exam:   General:  NAD. Cervical collar in place.  Cardiovascular: Regular rate rhythm no murmurs rubs or gallops.  Respiratory: Clear to auscultation in the anterior lung fields.  Abdomen: Soft, nontender, nondistended, positive bowel sounds.  Musculoskeletal: No clubbing no cyanosis. 3+ bilateral lower extremity edema.  Data Reviewed: Basic Metabolic Panel:  Recent Labs Lab 08/06/14 1808 08/07/14 0340 08/07/14 0842  NA 139 141  --   K 3.0* 3.2*  --   CL 103 103  --   CO2 26 31  --   GLUCOSE 94 88  --   BUN 8  6  --   CREATININE 0.52 0.56  --   CALCIUM 8.2* 8.0*  --   MG  --   --  1.9   Liver Function Tests:  Recent Labs Lab 08/07/14 0340  AST 22  ALT 9  ALKPHOS 120*  BILITOT 0.9  PROT 5.0*  ALBUMIN 2.4*   No results for input(s): LIPASE, AMYLASE in the last 168 hours. No results for  input(s): AMMONIA in the last 168 hours. CBC:  Recent Labs Lab 08/06/14 1808 08/07/14 0340  WBC 5.9 4.9  NEUTROABS 4.6  --   HGB 9.0* 8.5*  HCT 28.6* 27.1*  MCV 88.3 87.7  PLT 327 297   Cardiac Enzymes:  Recent Labs Lab 08/06/14 2135 08/07/14 0340 08/07/14 0842  TROPONINI 0.03 <0.03 0.03   BNP (last 3 results)  Recent Labs  08/06/14 1808  BNP 133.3*    ProBNP (last 3 results) No results for input(s): PROBNP in the last 8760 hours.  CBG: No results for input(s): GLUCAP in the last 168 hours.  Recent Results (from the past 240 hour(s))  MRSA PCR Screening     Status: None   Collection Time: 08/06/14 11:31 PM  Result Value Ref Range Status   MRSA by PCR NEGATIVE NEGATIVE Final    Comment:        The GeneXpert MRSA Assay (FDA approved for NASAL specimens only), is one component of a comprehensive MRSA colonization surveillance program. It is not intended to diagnose MRSA infection nor to guide or monitor treatment for MRSA infections.      Studies: Nm Pulmonary Perf And Vent  08/07/2014   CLINICAL DATA:  69 year old with chest pain and positive D-dimer test.  EXAM: NUCLEAR MEDICINE VENTILATION - PERFUSION LUNG SCAN  TECHNIQUE: Ventilation images were obtained in multiple projections using inhaled aerosol technetium 99 M DTPA. Perfusion images were obtained in multiple projections after intravenous injection of Tc-42m MAA.  RADIOPHARMACEUTICALS:  40 mCi Tc-56m DTPA aerosol and 6 mCi Tc-81m MAA  COMPARISON:  Chest radiograph 08/06/2014 and VQ scan and 04/25/2011  FINDINGS: Ventilation: Patchy uptake in the lower lungs without a large focal defect.  Perfusion: No wedge shaped peripheral perfusion defects to suggest acute pulmonary embolism.  IMPRESSION: No evidence for a pulmonary embolism.   Electronically Signed   By: Markus Daft M.D.   On: 08/07/2014 15:26   Dg Chest Portable 1 View  08/06/2014   CLINICAL DATA:  Status post surgery for scoliosis 06/09/2014 and  07/14/2014. Shortness of breath and lower extremity swelling.  EXAM: PORTABLE CHEST - 1 VIEW  COMPARISON:  Single view of the chest 04/24/2011.  FINDINGS: New extensive spinal stabilization hardware is in place. Surgical staples are identified in the midline. There are small bilateral pleural effusions and basilar atelectasis. No pneumothorax is identified. Multiple rib fractures are seen including the right fourth through eighth and left fourth and fifth ribs. The fractures appear acute or subacute.  IMPRESSION: Bilateral acute or subacute rib fractures. Negative for pneumothorax.  Small bilateral pleural effusions and mild basilar atelectasis.   Electronically Signed   By: Inge Rise M.D.   On: 08/06/2014 18:01    Scheduled Meds: . carvedilol  3.125 mg Oral BID WC  . enoxaparin (LOVENOX) injection  40 mg Subcutaneous Q24H  . furosemide  40 mg Intravenous BID  . pantoprazole  20 mg Oral Daily  . polyethylene glycol  17 g Oral QODAY  . sodium chloride  3 mL Intravenous Q12H  . sodium chloride  3 mL Intravenous Q12H  . tamsulosin  0.4 mg Oral QPC supper   Continuous Infusions:   Principal Problem:   Bilateral lower extremity edema Active Problems:   S/P cervical spinal fusion   GERD (gastroesophageal reflux disease)   Lumbar back pain   Dyspnea   Anemia   Hypokalemia   Chronic a-fib   Fracture of multiple ribs    Time spent: 36 minutes    Bernyce Brimley M.D. [facility] Pager [phone]. If 7PM-7AM, please contact night-coverage at [email or web address], password [facility] 08/07/2014, 4:50 PM  LOS: 1 day

## 2014-08-08 DIAGNOSIS — R0600 Dyspnea, unspecified: Secondary | ICD-10-CM

## 2014-08-08 DIAGNOSIS — D509 Iron deficiency anemia, unspecified: Secondary | ICD-10-CM

## 2014-08-08 DIAGNOSIS — R600 Localized edema: Secondary | ICD-10-CM

## 2014-08-08 LAB — FOLATE RBC
Folate, Hemolysate: 444.7 ng/mL
Folate, RBC: 1672 ng/mL (ref >498)
Hematocrit: 26.6 % — ABNORMAL LOW (ref 34.0–46.6)

## 2014-08-08 LAB — CBC
HCT: 25.5 % — ABNORMAL LOW (ref 36.0–46.0)
Hemoglobin: 8 g/dL — ABNORMAL LOW (ref 12.0–15.0)
MCH: 27.9 pg (ref 26.0–34.0)
MCHC: 31.4 g/dL (ref 30.0–36.0)
MCV: 88.9 fL (ref 78.0–100.0)
Platelets: 289 10*3/uL (ref 150–400)
RBC: 2.87 MIL/uL — ABNORMAL LOW (ref 3.87–5.11)
RDW: 15.1 % (ref 11.5–15.5)
WBC: 3.8 10*3/uL — ABNORMAL LOW (ref 4.0–10.5)

## 2014-08-08 LAB — BASIC METABOLIC PANEL
Anion gap: 4 — ABNORMAL LOW (ref 5–15)
BUN: 9 mg/dL (ref 6–23)
CO2: 32 mmol/L (ref 19–32)
Calcium: 8 mg/dL — ABNORMAL LOW (ref 8.4–10.5)
Chloride: 103 mmol/L (ref 96–112)
Creatinine, Ser: 0.56 mg/dL (ref 0.50–1.10)
GFR calc Af Amer: >90 mL/min (ref >90)
GFR calc non Af Amer: >90 mL/min (ref >90)
Glucose, Bld: 93 mg/dL (ref 70–99)
Potassium: 3.9 mmol/L (ref 3.5–5.1)
Sodium: 139 mmol/L (ref 135–145)

## 2014-08-08 MED ORDER — POLYSACCHARIDE IRON COMPLEX 150 MG PO CAPS
150.0000 mg | ORAL_CAPSULE | Freq: Every day | ORAL | Status: DC
  Administered 2014-08-09: 150 mg via ORAL
  Filled 2014-08-08: qty 1

## 2014-08-08 MED ORDER — POLYSACCHARIDE IRON COMPLEX 150 MG PO CAPS: 150.0000 mg | ORAL_CAPSULE | Freq: Every day | ORAL | Status: DC

## 2014-08-08 MED ORDER — SODIUM CHLORIDE 0.9 % IV SOLN
500.0000 mg | Freq: Every day | INTRAVENOUS | Status: AC
  Administered 2014-08-08 – 2014-08-09 (×2): 500 mg via INTRAVENOUS
  Filled 2014-08-08 (×3): qty 10

## 2014-08-08 MED ORDER — SODIUM CHLORIDE 0.9 % IV SOLN
25.0000 mg | Freq: Once | INTRAVENOUS | Status: AC
  Administered 2014-08-08: 25 mg via INTRAVENOUS
  Filled 2014-08-08: qty 0.5

## 2014-08-08 NOTE — Progress Notes (Signed)
MEDICATION RELATED CONSULT NOTE - INITIAL   Pharmacy Consult for IV Iron Indication: low iron stores  Allergies  Allergen Reactions  . Contrast Media [Iodinated Diagnostic Agents]     unknown  . Erythromycin     unknown  . Latex     hives  . Levofloxacin Nausea And Vomiting  . Other     IV CONTRAST /"DYE".  . Septra [Bactrim]     Hives   . Sulfa Antibiotics     rash    Patient Measurements: Height: 5\' 7"  (170.2 cm) Weight: 134 lb 12.8 oz (61.145 kg) IBW/kg (Calculated) : 61.6  Vital Signs: Temp: 98.7 F (37.1 C) (02/02 0500) Temp Source: Oral (02/02 0500) BP: 101/56 mmHg (02/02 0838) Pulse Rate: 93 (02/02 0838) Intake/Output from previous day: 02/01 0701 - 02/02 0700 In: 240 [P.O.:240] Out: 2490 [Urine:2490] Intake/Output from this shift: Total I/O In: 75 [I.V.:25; IV Piggyback:50] Out: 850 [Urine:850]  Labs:  Recent Labs  08/06/14 1808 08/07/14 0340 08/07/14 0842 08/08/14 0526  WBC 5.9 4.9  --  3.8*  HGB 9.0* 8.5*  --  8.0*  HCT 28.6* 27.1*  --  25.5*  PLT 327 297  --  289  CREATININE 0.52 0.56  --  0.56  MG  --   --  1.9  --   ALBUMIN  --  2.4*  --   --   PROT  --  5.0*  --   --   AST  --  22  --   --   ALT  --  9  --   --   ALKPHOS  --  120*  --   --   BILITOT  --  0.9  --   --    Estimated Creatinine Clearance: 63.1 mL/min (by C-G formula based on Cr of 0.56).  Medical History: Past Medical History  Diagnosis Date  . Vaginal delivery     ONE NSVD  . Vulvodynia   . PMDD (premenstrual dysphoric disorder)   . IC (intermittent claudication)   . Atrial fibrillation   . Melanoma of ankle   . Rotator cuff tear   . Melanoma   . Kidney stone   . Heart disease     noted A Fib once  . GERD (gastroesophageal reflux disease)   . Herpes   . Allergy   . Anxiety   . Benign paroxysmal positional vertigo 06/08/2013  . Peripheral neuropathy 11/06/2011  . Hair loss 04/12/2012  . Osteopenia 02/18/2012  . 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 Interstitial cystitis  11/06/2011  . Fracture of multiple ribs   . Chronic a-fib 08/07/2014    Assessment:   Anemia, Hgb recently 6.8 at [facility], 1 unit PRBCs given. Hgb 9.0 on admit to [facility] on 08/06/14 pm; trended down to 8.0 today.  No overt bleeding.   Anemia panel done 08/07/14: Fe 22, t-sat 12.   Estimated iron deficit ~209-425-0020 mg, for target Hgb 12-14.   RN reports pain with IV infusions, including saline. No hypersensitivity reactions to Infed test dose.  IV site to be changed.  Goal of Therapy:   iron replenishment  Hgb > 12.0  Plan:   Infed 25 mg IV test dose. Completed, tolerated.  Infed 500 mg IV x 2 doses. One this afternoon, one on 08/09/14 in the morning.  Consider oral iron supplement prn laxative at discharge.  Arty Baumgartner, [place] Pager: [phone] 08/08/2014,12:05 PM

## 2014-08-08 NOTE — Progress Notes (Signed)
Bilateral lower extremity venous duplex completed:  No evidence of DVT, superficial thrombosis, or Baker's cyst.   

## 2014-08-08 NOTE — Progress Notes (Signed)
[facility] PROGRESS NOTE  [name] PPI:[id number] DOB: 23-Jul-1945 DOA: 08/06/2014 PCP: [name], MD  Assessment/Plan: #1 bilateral lower extremity edema  Questionable etiology. ??? secondary to volume resuscitation during recent surgery and transfusion of packed red blood cells as her hemoglobin had dropped to 6.8 during her prior hospitalization at [facility] vs 3rd spacing with low albumin. Patient's LFTs are within normal limits and lower extremity edema is not secondary to liver disease. Patient's renal function is normal and urinalysis is negative for proteinuria. Patient is not on any calcium channel blockers. Cardiac enzymes negative 3. Pro BNP is 72.6 from 133.3, on admission. 2-D echo is currently pending. I/O = -2.25 L over the past 24 hours. Weight today is 61.15 kg from 62.14 kg yesterday. Continue Lasix 40 mg IV every 12 hours. Strict I's and O's. Daily weights. Lower extremity Dopplers negative for DVT. Follow.  #2 dyspnea Questionable etiology. Likely secondary to subacute rib fractures. Clinical improvement. Chest x-ray was negative for any acute infiltrate. Patient however did have a elevated d-dimer and due to contrast allergy VQ scan has been ordered. VQ scan was done and is negative for PE. Lower extremity Dopplers negative for DVT. Continue incentive spirometry, pain management. Follow.  #3 tachycardia Patient with history of atrial fibrillation. EKG with normal sinus rhythm. Cardiac markers negative 3. 2-D echo is pending. VQ scan negative for PE. TSH is within normal limits at 2.381. Tachycardia has improved. Continue current dose of Coreg.  #4 iron deficiency anemia/acute blood loss anemia Patient with recent surgery with recent acute blood loss anemia during her prior hospitalization at [facility]. Patient's hemoglobin during her prior hospitalization dropped as low as 6.8. Patient with no overt bleeding. Patient's hemoglobin is currently at  8.0. Anemia panel is consistent with iron deficiency anemia. We'll give a dose of IV iron. Place on oral iron supplementation. Follow H&H.  #5 elevated blood pressure Continue Coreg.  #6 history of atrial fibrillation Chads2VASC score is 2. Continue Coreg for rate control. Will hold off on aspirin for now secondary to recent spinal fusion surgery.  #7 hypokalemia Repleted. Magnesium level was 1.9.   #8 status post spinal surgery Stable. Continue cervical collar. Outpatient follow-up.  #9 bilateral rib fractures Pain management. Incentive spirometry.  #10 prophylaxis Lovenox for DVT prophylaxis.   Code Status: Full Family Communication: Updated patient and sister at bedside. Disposition Plan: Home when medically stable and edema is resolved.   Consultants:  None  Procedures:  Chest x-ray 08/06/2014  V/Q scan 08/07/2014  Lower extremity Dopplers 08/08/2014  Antibiotics:  None  HPI/Subjective: Patient denies any shortness of breath. Patient denies any chest pain. Patient states sensation of palpitations has resolved. Patient feels some improvement in lower extremity edema.  Objective: Filed Vitals:   08/08/14 0838  BP: 101/56  Pulse: 93  Temp:   Resp:     Intake/Output Summary (Last 24 hours) at 08/08/14 1226 Last data filed at 08/08/14 1103  Gross per 24 hour  Intake    315 ml  Output   2440 ml  Net  -2125 ml   Filed Weights   08/06/14 1720 08/06/14 2118  Weight: 62.143 kg (137 lb) 61.145 kg (134 lb 12.8 oz)    Exam:   General:  NAD. Cervical collar in place.  Cardiovascular: Regular rate rhythm no murmurs rubs or gallops.  Respiratory: Clear to auscultation in the anterior lung fields.  Abdomen: Soft, nontender, nondistended, positive bowel sounds.  Musculoskeletal: No clubbing no cyanosis.  2+ bilateral lower extremity edema.  Data Reviewed: Basic Metabolic Panel:  Recent Labs Lab 08/06/14 1808 08/07/14 0340 08/07/14 0842  08/08/14 0526  NA 139 141  --  139  K 3.0* 3.2*  --  3.9  CL 103 103  --  103  CO2 26 31  --  32  GLUCOSE 94 88  --  93  BUN 8 6  --  9  CREATININE 0.52 0.56  --  0.56  CALCIUM 8.2* 8.0*  --  8.0*  MG  --   --  1.9  --    Liver Function Tests:  Recent Labs Lab 08/07/14 0340  AST 22  ALT 9  ALKPHOS 120*  BILITOT 0.9  PROT 5.0*  ALBUMIN 2.4*   No results for input(s): LIPASE, AMYLASE in the last 168 hours. No results for input(s): AMMONIA in the last 168 hours. CBC:  Recent Labs Lab 08/06/14 1808 08/07/14 0340 08/08/14 0526  WBC 5.9 4.9 3.8*  NEUTROABS 4.6  --   --   HGB 9.0* 8.5* 8.0*  HCT 28.6* 27.1* 25.5*  MCV 88.3 87.7 88.9  PLT 327 297 289   Cardiac Enzymes:  Recent Labs Lab 08/06/14 2135 08/07/14 0340 08/07/14 0842  TROPONINI 0.03 <0.03 0.03   BNP (last 3 results)  Recent Labs  08/06/14 1808 08/07/14 0830  BNP 133.3* 72.6    ProBNP (last 3 results) No results for input(s): PROBNP in the last 8760 hours.  CBG: No results for input(s): GLUCAP in the last 168 hours.  Recent Results (from the past 240 hour(s))  MRSA PCR Screening     Status: None   Collection Time: 08/06/14 11:31 PM  Result Value Ref Range Status   MRSA by PCR NEGATIVE NEGATIVE Final    Comment:        The GeneXpert MRSA Assay (FDA approved for NASAL specimens only), is one component of a comprehensive MRSA colonization surveillance program. It is not intended to diagnose MRSA infection nor to guide or monitor treatment for MRSA infections.      Studies: Nm Pulmonary Perf And Vent  08/07/2014   CLINICAL DATA:  69 year old with chest pain and positive D-dimer test.  EXAM: NUCLEAR MEDICINE VENTILATION - PERFUSION LUNG SCAN  TECHNIQUE: Ventilation images were obtained in multiple projections using inhaled aerosol technetium 99 M DTPA. Perfusion images were obtained in multiple projections after intravenous injection of Tc-11m MAA.  RADIOPHARMACEUTICALS:  40 mCi Tc-22m  DTPA aerosol and 6 mCi Tc-87m MAA  COMPARISON:  Chest radiograph 08/06/2014 and VQ scan and 04/25/2011  FINDINGS: Ventilation: Patchy uptake in the lower lungs without a large focal defect.  Perfusion: No wedge shaped peripheral perfusion defects to suggest acute pulmonary embolism.  IMPRESSION: No evidence for a pulmonary embolism.   Electronically Signed   By: Markus Daft M.D.   On: 08/07/2014 15:26   Dg Chest Portable 1 View  08/06/2014   CLINICAL DATA:  Status post surgery for scoliosis 06/09/2014 and 07/14/2014. Shortness of breath and lower extremity swelling.  EXAM: PORTABLE CHEST - 1 VIEW  COMPARISON:  Single view of the chest 04/24/2011.  FINDINGS: New extensive spinal stabilization hardware is in place. Surgical staples are identified in the midline. There are small bilateral pleural effusions and basilar atelectasis. No pneumothorax is identified. Multiple rib fractures are seen including the right fourth through eighth and left fourth and fifth ribs. The fractures appear acute or subacute.  IMPRESSION: Bilateral acute or subacute rib fractures. Negative for pneumothorax.  Small bilateral pleural effusions and mild basilar atelectasis.   Electronically Signed   By: Inge Rise M.D.   On: 08/06/2014 18:01    Scheduled Meds: . carvedilol  3.125 mg Oral BID WC  . enoxaparin (LOVENOX) injection  40 mg Subcutaneous Q24H  . furosemide  40 mg Intravenous BID  . iron dextran (INFED/DEXFERRUM) infusion  500 mg Intravenous Daily  . pantoprazole  20 mg Oral Daily  . polyethylene glycol  17 g Oral QODAY  . sodium chloride  3 mL Intravenous Q12H  . sodium chloride  3 mL Intravenous Q12H  . tamsulosin  0.4 mg Oral QPC supper   Continuous Infusions:   Principal Problem:   Bilateral lower extremity edema Active Problems:   S/P cervical spinal fusion   GERD (gastroesophageal reflux disease)   Lumbar back pain   Dyspnea   Anemia   Hypokalemia   Chronic a-fib   Fracture of multiple ribs    Rib fractures   Tachycardia    Time spent: 31 minutes    Jahkari Maclin M.D. [facility] Pager [phone]. If 7PM-7AM, please contact night-coverage at [email or web address], password [facility] 08/08/2014, 12:26 PM  LOS: 2 days

## 2014-08-08 NOTE — Progress Notes (Signed)
  Echocardiogram 2D Echocardiogram has been performed.  [name] 08/08/2014, 1:04 PM

## 2014-08-09 LAB — CBC
HCT: 29.1 % — ABNORMAL LOW (ref 36.0–46.0)
Hemoglobin: 9.2 g/dL — ABNORMAL LOW (ref 12.0–15.0)
MCH: 28.5 pg (ref 26.0–34.0)
MCHC: 31.6 g/dL (ref 30.0–36.0)
MCV: 90.1 fL (ref 78.0–100.0)
Platelets: 321 10*3/uL (ref 150–400)
RBC: 3.23 MIL/uL — ABNORMAL LOW (ref 3.87–5.11)
RDW: 15 % (ref 11.5–15.5)
WBC: 3.6 10*3/uL — ABNORMAL LOW (ref 4.0–10.5)

## 2014-08-09 LAB — BASIC METABOLIC PANEL
Anion gap: 7 (ref 5–15)
BUN: 10 mg/dL (ref 6–23)
CO2: 30 mmol/L (ref 19–32)
Calcium: 8.4 mg/dL (ref 8.4–10.5)
Chloride: 102 mmol/L (ref 96–112)
Creatinine, Ser: 0.57 mg/dL (ref 0.50–1.10)
GFR calc Af Amer: >90 mL/min (ref >90)
GFR calc non Af Amer: >90 mL/min (ref >90)
Glucose, Bld: 100 mg/dL — ABNORMAL HIGH (ref 70–99)
Potassium: 4 mmol/L (ref 3.5–5.1)
Sodium: 139 mmol/L (ref 135–145)

## 2014-08-09 LAB — TROPONIN I: Troponin I: <0.03 ng/mL (ref <0.031)

## 2014-08-09 MED ORDER — OXYCODONE HCL 5 MG PO TABS
2.5000 mg | ORAL_TABLET | Freq: Once | ORAL | Status: AC
  Administered 2014-08-09: 2.5 mg via ORAL
  Filled 2014-08-09: qty 1

## 2014-08-09 MED ORDER — LORAZEPAM 2 MG/ML IJ SOLN
0.5000 mg | Freq: Once | INTRAMUSCULAR | Status: AC
  Administered 2014-08-09: 0.5 mg via INTRAVENOUS
  Filled 2014-08-09: qty 1

## 2014-08-09 MED ORDER — PANTOPRAZOLE SODIUM 40 MG PO TBEC: 40.0000 mg | DELAYED_RELEASE_TABLET | Freq: Every day | ORAL | Status: DC

## 2014-08-09 MED ORDER — POTASSIUM CHLORIDE ER 20 MEQ PO TBCR: 10.0000 meq | EXTENDED_RELEASE_TABLET | Freq: Every day | ORAL | Status: DC

## 2014-08-09 MED ORDER — FUROSEMIDE 40 MG PO TABS: 40.0000 mg | ORAL_TABLET | Freq: Every day | ORAL | Status: DC

## 2014-08-09 MED ORDER — GI COCKTAIL ~~LOC~~
30.0000 mL | Freq: Once | ORAL | Status: AC
  Administered 2014-08-09: 30 mL via ORAL
  Filled 2014-08-09: qty 30

## 2014-08-09 MED ORDER — CARVEDILOL 3.125 MG PO TABS: 3.1250 mg | ORAL_TABLET | Freq: Two times a day (BID) | ORAL | Status: DC

## 2014-08-09 MED ORDER — POLYSACCHARIDE IRON COMPLEX 150 MG PO CAPS: 150.0000 mg | ORAL_CAPSULE | Freq: Every day | ORAL | Status: DC

## 2014-08-09 NOTE — Evaluation (Signed)
Occupational Therapy Evaluation and Discharge Patient Details Name: [name] MRN: [id number] DOB: [birth year] Today's Date: 08/09/2014    History of Present Illness Pt is a 69 yo female with hx of afib, apparently c/o lower ext swelling x1 week, since discharge from [facility] for cervical/thoracic fusion. Pt reports weight gain and DOE.Pt presented to ED tonight due to her lower ext edema worsening. Pt noted to have significant anemia and to be tachycardic.   Clinical Impression   This 69 yo female admitted with above presents to acute OT at a Mod I level and actually has 24/7 S at home if needed. No further OT needs identified, we will sign off.    Follow Up Recommendations  No OT follow up    Equipment Recommendations  None recommended by OT       Precautions / Restrictions Precautions Precautions: Fall;Cervical Required Braces or Orthoses: Cervical Brace Cervical Brace: Hard collar (on at all times per pt) Restrictions Weight Bearing Restrictions: No      Mobility Bed Mobility Overal bed mobility: Modified Independent                Transfers Overall transfer level: Modified independent                         ADL Overall ADL's : Modified independent                                                       Pertinent Vitals/Pain Pain Assessment: No/denies pain     Hand Dominance Right   Extremity/Trunk Assessment Upper Extremity Assessment Upper Extremity Assessment: Overall WFL for tasks assessed           Communication Communication Communication: No difficulties   Cognition Arousal/Alertness: Awake/alert Behavior During Therapy: WFL for tasks assessed/performed Overall Cognitive Status: Within Functional Limits for tasks assessed                                Home Living Family/patient expects to be discharged to:: Private residence Living Arrangements: Other relatives (sister and son) Available  Help at Discharge: Family;Available 24 hours/day Type of Home: House Home Access: Stairs to enter [employer] of Steps: 4 Entrance Stairs-Rails: Right Home Layout: Two level;Able to live on main level with bedroom/bathroom     Bathroom Shower/Tub: Tub/shower unit;Curtain Shower/tub characteristics: Architectural technologist: Standard     Home Equipment: Environmental consultant - 2 wheels;Bedside commode;Shower seat          Prior Functioning/Environment Level of Independence: Independent        Comments: Pt states that she has DME from her surgeries to use at home, but hasn't used them yet.    OT Diagnosis: Generalized weakness         OT Goals(Current goals can be found in the care plan section) Acute Rehab OT Goals Patient Stated Goal: home today  OT Frequency:                End of Session Equipment Utilized During Treatment: Cervical collar  Activity Tolerance: Patient tolerated treatment well Patient left: in bed;with call bell/phone within reach   Time: 1209-1229 OT Time Calculation (min): 20 min Charges:  OT General Charges $OT Visit: 1 Procedure OT  Evaluation $Initial OT Evaluation Tier I: 1 Procedure  Almon Register [phone] 08/09/2014, 1:09 PM

## 2014-08-09 NOTE — Progress Notes (Signed)
Discharge instructions and medications discussed with patient.  All questions answered.  

## 2014-08-09 NOTE — Progress Notes (Signed)
Follow-up:  Notified by RN that pt c/o 10/10 CP. 12-lead EKG obtained but did not cross over into EPIC. [name] RN reports pt has been quite anxious all shift and felt current CP likely related to anxiety as well. She has had minimal improvement w/ her po Klonopin. At bedside pt noted in NAD. She does appear anxious and is very talkative regarding her recent surgeries and her hope of going home tomorrow, etc. There is no SOB or diaphoresis. Pt states the pain was burning in nature, did not radiate and very was c/w the pain she has w/ GERD. However she states it has never been that severe nor has it lasted that long. Current pain 4/10 after GI cocktail. BBS CTA. VSS. EKG reveals SR w/ rate of 92 and PAC's. No acute changes. Recent cardiac enzymes have been negative x 3, V-Q scan neg for PE and TSH wnl.  Assessment/Plan: 1. Chest pain: Improved w/ GI cocktail. Likely GI source w/ associated anxiety. Will give small dose of IV Ativan and repeat Oxy IR 2.5 mg po. Will add troponin x 1.  Will continue to monitor closely on telemetry.   Jeryl Columbia, NP-C [facility] Pager [phone]

## 2014-08-09 NOTE — Discharge Summary (Signed)
Physician Discharge Summary  [name] MRN: [id number] DOB/AGE: May 27, 1946 69 y.o.  PCP: [name], MD   Admit date: 08/06/2014 Discharge date: 08/09/2014  Discharge Diagnoses:      Bilateral lower extremity edema Active Problems:   S/P cervical spinal fusion   GERD (gastroesophageal reflux disease)   Lumbar back pain   Dyspnea   Anemia   Hypokalemia   Chronic a-fib   Fracture of multiple ribs   Rib fractures   Tachycardia  Follow up recommendations Follow up with PCP  Follow up cbc and cmp in one week     Medication List    TAKE these medications        acetaminophen 500 MG tablet  Commonly known as:  TYLENOL  Take 1,000 mg by mouth every 6 (six) hours as needed for mild pain.     carvedilol 3.125 MG tablet  Commonly known as:  COREG  Take 1 tablet (3.125 mg total) by mouth 2 (two) times daily with a meal.     clonazePAM 0.5 MG tablet  Commonly known as:  KLONOPIN  Take 0.25 mg by mouth 2 (two) times daily as needed for anxiety.     furosemide 40 MG tablet  Commonly known as:  LASIX  Take 1 tablet (40 mg total) by mouth daily.     iron polysaccharides 150 MG capsule  Commonly known as:  NIFEREX  Take 1 capsule (150 mg total) by mouth daily.     methocarbamol 500 MG tablet  Commonly known as:  ROBAXIN  Take 1 tablet (500 mg total) by mouth 3 (three) times daily.     oxycodone 5 MG capsule  Commonly known as:  OXY-IR  Take 2.5 mg by mouth every 4 (four) hours as needed.     OxyCODONE HCl (Abuse Deter) 5 MG Taba  Take 5 mg by mouth every 4 (four) hours. Along with tid prn     pantoprazole 40 MG tablet  Commonly known as:  PROTONIX  Take 1 tablet (40 mg total) by mouth daily.     polyethylene glycol packet  Commonly known as:  MIRALAX / GLYCOLAX  Take 17 g by mouth every other day.     Potassium Chloride ER 20 MEQ Tbcr  Take 10 mEq by mouth daily.     tamsulosin 0.4 MG Caps capsule  Commonly known as:  FLOMAX  Take 0.4 mg by  mouth.        Discharge Condition:    Disposition: 01-Home or Self Care   Consults: *none   Significant Diagnostic Studies: Nm Pulmonary Perf And Vent  08/07/2014   CLINICAL DATA:  69 year old with chest pain and positive D-dimer test.  EXAM: NUCLEAR MEDICINE VENTILATION - PERFUSION LUNG SCAN  TECHNIQUE: Ventilation images were obtained in multiple projections using inhaled aerosol technetium 99 M DTPA. Perfusion images were obtained in multiple projections after intravenous injection of Tc-43mMAA.  RADIOPHARMACEUTICALS:  40 mCi Tc-931mTPA aerosol and 6 mCi Tc-99107mA  COMPARISON:  Chest radiograph 08/06/2014 and VQ scan and 04/25/2011  FINDINGS: Ventilation: Patchy uptake in the lower lungs without a large focal defect.  Perfusion: No wedge shaped peripheral perfusion defects to suggest acute pulmonary embolism.  IMPRESSION: No evidence for a pulmonary embolism.   Electronically Signed   By: AdaMarkus DaftD.   On: 08/07/2014 15:26   Dg Chest Portable 1 View  08/06/2014   CLINICAL DATA:  Status post surgery for scoliosis 06/09/2014 and 07/14/2014. Shortness  of breath and lower extremity swelling.  EXAM: PORTABLE CHEST - 1 VIEW  COMPARISON:  Single view of the chest 04/24/2011.  FINDINGS: New extensive spinal stabilization hardware is in place. Surgical staples are identified in the midline. There are small bilateral pleural effusions and basilar atelectasis. No pneumothorax is identified. Multiple rib fractures are seen including the right fourth through eighth and left fourth and fifth ribs. The fractures appear acute or subacute.  IMPRESSION: Bilateral acute or subacute rib fractures. Negative for pneumothorax.  Small bilateral pleural effusions and mild basilar atelectasis.   Electronically Signed   By: Inge Rise M.D.   On: 08/06/2014 18:01    ECHO=pending     Microbiology: Recent Results (from the past 240 hour(s))  MRSA PCR Screening     Status: None   Collection Time: 08/06/14  11:31 PM  Result Value Ref Range Status   MRSA by PCR NEGATIVE NEGATIVE Final    Comment:        The GeneXpert MRSA Assay (FDA approved for NASAL specimens only), is one component of a comprehensive MRSA colonization surveillance program. It is not intended to diagnose MRSA infection nor to guide or monitor treatment for MRSA infections.      Labs: Results for orders placed or performed during the hospital encounter of 08/06/14 (from the past 48 hour(s))  CBC     Status: Abnormal   Collection Time: 08/08/14  5:26 AM  Result Value Ref Range   WBC 3.8 (L) 4.0 - 10.5 K/uL   RBC 2.87 (L) 3.87 - 5.11 MIL/uL   Hemoglobin 8.0 (L) 12.0 - 15.0 g/dL   HCT 25.5 (L) 36.0 - 46.0 %   MCV 88.9 78.0 - 100.0 fL   MCH 27.9 26.0 - 34.0 pg   MCHC 31.4 30.0 - 36.0 g/dL   RDW 15.1 11.5 - 15.5 %   Platelets 289 150 - 400 K/uL  Basic metabolic panel     Status: Abnormal   Collection Time: 08/08/14  5:26 AM  Result Value Ref Range   Sodium 139 135 - 145 mmol/L   Potassium 3.9 3.5 - 5.1 mmol/L   Chloride 103 96 - 112 mmol/L   CO2 32 19 - 32 mmol/L   Glucose, Bld 93 70 - 99 mg/dL   BUN 9 6 - 23 mg/dL   Creatinine, Ser 0.56 0.50 - 1.10 mg/dL   Calcium 8.0 (L) 8.4 - 10.5 mg/dL   GFR calc non Af Amer >90 >90 mL/min   GFR calc Af Amer >90 >90 mL/min    Comment: (NOTE) The eGFR has been calculated using the CKD EPI equation. This calculation has not been validated in all clinical situations. eGFR's persistently <90 mL/min signify possible Chronic Kidney Disease.    Anion gap 4 (L) 5 - 15  CBC     Status: Abnormal   Collection Time: 08/09/14  7:32 AM  Result Value Ref Range   WBC 3.6 (L) 4.0 - 10.5 K/uL   RBC 3.23 (L) 3.87 - 5.11 MIL/uL   Hemoglobin 9.2 (L) 12.0 - 15.0 g/dL   HCT 29.1 (L) 36.0 - 46.0 %   MCV 90.1 78.0 - 100.0 fL   MCH 28.5 26.0 - 34.0 pg   MCHC 31.6 30.0 - 36.0 g/dL   RDW 15.0 11.5 - 15.5 %   Platelets 321 150 - 400 K/uL  Basic metabolic panel     Status: Abnormal    Collection Time: 08/09/14  7:32 AM  Result Value Ref  Range   Sodium 139 135 - 145 mmol/L   Potassium 4.0 3.5 - 5.1 mmol/L   Chloride 102 96 - 112 mmol/L   CO2 30 19 - 32 mmol/L   Glucose, Bld 100 (H) 70 - 99 mg/dL   BUN 10 6 - 23 mg/dL   Creatinine, Ser 0.57 0.50 - 1.10 mg/dL   Calcium 8.4 8.4 - 10.5 mg/dL   GFR calc non Af Amer >90 >90 mL/min   GFR calc Af Amer >90 >90 mL/min    Comment: (NOTE) The eGFR has been calculated using the CKD EPI equation. This calculation has not been validated in all clinical situations. eGFR's persistently <90 mL/min signify possible Chronic Kidney Disease.    Anion gap 7 5 - 15  Troponin I     Status: None   Collection Time: 08/09/14  7:32 AM  Result Value Ref Range   Troponin I <0.03 <0.031 ng/mL    Comment:        NO INDICATION OF MYOCARDIAL INJURY.      HPI :69 yo female with hx of afib (chads2 score =2), apparently c//o lower ext swelling x 1 week, since discharge from [facility] + slight weight gain, + dyspnea, denies fever, chills, cough, pnd, orthopnea. Pt presented to ED tonight due to her lower ext edema worsening. CXR negative for CHF. Trop i negative. Pt noted to have significant anemia. Pt noted to be tachycardic. Pt will be admitted for iv lasix for edema. And evaluation of tachycardia  HOSPITAL COURSE:  #1 bilateral lower extremity edema  Multifactorial  secondary to volume resuscitation during recent surgery and transfusion of packed red blood cells as her hemoglobin had dropped to 6.8 during her prior hospitalization at [facility] vs 3rd spacing with low albumin 2.4 . Patient's LFTs are within normal limits and lower extremity edema is not secondary to liver disease. Patient's renal function is normal and urinalysis is negative for proteinuria. Patient is not on any calcium channel blockers. Cardiac enzymes negative 3.  Pro BNP is 72.6 from 133.3, on admission . 2-D echo is currently pending.DC home today if  negative Received  Lasix 40 mg IV every 12 hours.  Swelling improved Lower extremity Dopplers negative for DVT.  Dc home with lasix 40 mg PO  Also low dose beta blocker for tachycardia   #2 dyspnea Questionable etiology. Likely secondary to subacute rib fractures. Clinical improvement. Chest x-ray was negative for any acute infiltrate. Patient however did have a elevated d-dimer and due to contrast allergy  VQ scan was done and is negative for PE. Lower extremity Dopplers negative for DVT. Continue incentive spirometry, pain management. Follow.  #3 tachycardia Patient with history of atrial fibrillation. EKG with normal sinus rhythm. Cardiac markers negative 3. 2-D echo is pending. VQ scan negative for PE. TSH is within normal limits at 2.381. Tachycardia has improved. Continue current dose of Coreg.  #4 iron deficiency anemia/acute blood loss anemia Patient with recent surgery with recent acute blood loss anemia during her prior hospitalization at [facility]. Patient's hemoglobin during her prior hospitalization dropped as low as 6.8. Patient with no overt bleeding. Patient's hemoglobin is currently at 8.0. Anemia panel is consistent with iron deficiency anemia.given  a dose of IV iron. Place on oral iron supplementation. Follow CBC   #5 elevated blood pressure Continue Coreg.  #6 history of atrial fibrillation Chads2VASC score is 2. Continue Coreg for rate control. Will hold off on aspirin for now secondary to recent spinal fusion  surgery.  #7 hypokalemia Repleted. Magnesium level was 1.9.   #8 status post spinal surgery Stable. Continue cervical collar. Outpatient follow-up. Home HH PT   #9 bilateral rib fractures Continue Pain regimen from home . Incentive spirometry.  #10 prophylaxis Lovenox for DVT prophylaxis.   Code Status: Full   Discharge Exam:    Blood pressure 100/49, pulse 99, temperature 98.5 F (36.9 C), temperature source Oral, resp. rate 18, height  5' 7" (1.702 m), weight 60.9 kg (134 lb 4.2 oz), SpO2 98 %.  General: NAD. Cervical collar in place.  Cardiovascular: Regular rate rhythm no murmurs rubs or gallops.  Respiratory: Clear to auscultation in the anterior lung fields.  Abdomen: Soft, nontender, nondistended, positive bowel sounds.  Musculoskeletal: No clubbing no cyanosis. 2+ bilateral lower extremity edema.        Discharge Instructions    Diet - low sodium heart healthy    Complete by:  As directed      Increase activity slowly    Complete by:  As directed            Follow-up Information    Follow up with [name], MD. Schedule an appointment as soon as possible for a visit in 1 week.   Specialty:  Family Medicine   Contact information:   [place] [address] [phone]       Signed: Reyne [name] 08/09/2014, 1:03 PM

## 2014-08-09 NOTE — Progress Notes (Signed)
Utilization Review Completed.[name] T2/09/2014  

## 2014-08-09 NOTE — Evaluation (Signed)
Physical Therapy Evaluation Patient Details Name: [name] MRN: [id number] DOB: [birth year] Today's Date: 08/09/2014   History of Present Illness  Pt is a 68 yo female with hx of afib, apparently c/o lower ext swelling x1 week, since discharge from [facility] for cervical/thoracic fusion. Pt reports weight gain and DOE.Pt presented to ED tonight due to her lower ext edema worsening. Pt noted to have significant anemia and to be tachycardic.  Clinical Impression  Pt admitted with above diagnosis. Pt currently with functional limitations due to the deficits listed below (see PT Problem List). At the time of PT eval pt was able to perform transfers and ambulation with supervision to min assist for balance. Pt will benefit from skilled PT to increase their independence and safety with mobility to allow discharge to the venue listed below. Pt reports that she already has HHPT services set up from surgery through [facility].     Follow Up Recommendations Home health PT;Supervision for mobility/OOB    Equipment Recommendations  None recommended by PT    Recommendations for Other Services       Precautions / Restrictions Precautions Precautions: Fall;Cervical Required Braces or Orthoses: Cervical Brace Cervical Brace: Hard collar;Other (comment) (OOB - per nursing pt manages cervical collar on her own.) Restrictions Weight Bearing Restrictions: No      Mobility  Bed Mobility               General bed mobility comments: Pt sitting up in the recliner upon PT arrival.   Transfers Overall transfer level: Needs assistance Equipment used: None Transfers: Sit to/from Stand Sit to Stand: Supervision         General transfer comment: Pt able to stand from recliner and [facility] without assistance. Supervision for safety as pt appears slightly unsteady with initial stand each time.   Ambulation/Gait Ambulation/Gait assistance: Min assist Ambulation Distance (Feet): 100  Feet Assistive device: 1 person hand held assist Gait Pattern/deviations: Step-through pattern;Decreased stride length;Trunk flexed;Narrow base of support Gait velocity: Decreased Gait velocity interpretation: Below normal speed for age/gender General Gait Details: Pt was able to ambulate with HHA for balance. Pt fatigues fairly quickly and asks to return to the room after ~50 feet.   Stairs            Wheelchair Mobility    Modified Rankin (Stroke Patients Only)       Balance Overall balance assessment: Needs assistance Sitting-balance support: Feet supported;No upper extremity supported Sitting balance-Leahy Scale: Good     Standing balance support: No upper extremity supported;During functional activity Standing balance-Leahy Scale: Fair                               Pertinent Vitals/Pain Pain Assessment: No/denies pain    Home Living Family/patient expects to be discharged to:: Private residence Living Arrangements: Other relatives (Sister and son) Available Help at Discharge: Family;Available 24 hours/day Type of Home: House Home Access: Stairs to enter Entrance Stairs-Rails: Right Entrance Stairs-Number of Steps: 4 Home Layout: Two level;Able to live on main level with bedroom/bathroom Home Equipment: Gilford Rile - 2 wheels;Bedside commode;Shower seat      Prior Function Level of Independence: Independent         Comments: Pt states that she has DME from her surgeries, however does not use them at home.      Hand Dominance   Dominant Hand: Right    Extremity/Trunk Assessment   Upper  Extremity Assessment: Defer to OT evaluation           Lower Extremity Assessment: Generalized weakness      Cervical / Trunk Assessment: Kyphotic;Other exceptions  Communication   Communication: No difficulties  Cognition Arousal/Alertness: Awake/alert Behavior During Therapy: WFL for tasks assessed/performed Overall Cognitive Status: Within  Functional Limits for tasks assessed                      General Comments      Exercises        Assessment/Plan    PT Assessment Patient needs continued PT services  PT Diagnosis Difficulty walking;Generalized weakness   PT Problem List Decreased strength;Decreased range of motion;Decreased activity tolerance;Decreased balance;Decreased mobility;Decreased knowledge of use of DME;Decreased safety awareness;Decreased knowledge of precautions;Pain  PT Treatment Interventions DME instruction;Gait training;Stair training;Functional mobility training;Therapeutic exercise;Therapeutic activities;Neuromuscular re-education;Patient/family education   PT Goals (Current goals can be found in the Care Plan section) Acute Rehab PT Goals Patient Stated Goal: Home today PT Goal Formulation: With patient Time For Goal Achievement: 08/16/14 Potential to Achieve Goals: Good    Frequency Min 3X/week   Barriers to discharge        Co-evaluation               End of Session Equipment Utilized During Treatment: Cervical collar Activity Tolerance: Patient limited by fatigue Patient left: in chair;with call bell/phone within reach Nurse Communication: Mobility status         Time: 0800-0825 PT Time Calculation (min) (ACUTE ONLY): 25 min   Charges:   PT Evaluation $Initial PT Evaluation Tier I: 1 Procedure PT Treatments $Gait Training: 8-22 mins   PT G Codes:        Rolinda Roan 2014-08-27, 9:46 AM  Rolinda Roan, PT, DPT Acute Rehabilitation Services Pager: [phone]

## 2014-08-10 ENCOUNTER — Telehealth: Payer: Self-pay | Admitting: Family Medicine

## 2014-08-10 NOTE — Telephone Encounter (Signed)
Please advise, pt last seen you 08/01/13. Pt was recently seen in hospital on 08-06-14 and was admitted.

## 2014-08-10 NOTE — Telephone Encounter (Signed)
Admit date: 08/06/2014 Discharge date: 08/09/2014  Reason for admission:  Dyspnea, BLE edema, Rib Fx  Follow up recommendations Follow up with PCP  Follow up cbc and cmp in one week  Transition Care Management Follow-up Telephone Call  How have you been since you were released from the hospital? Pt states that she's in a lot of pain, especially in the back of her head and neck at incision site. Pain medication is not helping much.  Rated pain 10/10.  Incision intact.  Wearing neck brace.  Patient was strongly encouraged to call neurosurgeon, Dr. Atilano Ina ([facility]) for pain management.  Pt stated understanding and agreed.  She also has a follow up appt with neurosurgeon on Monday, 08/14/14.  She also continues to have some swelling in her ankles, but edema is much improved.  She is taking Lasix as prescribed.  She was encouraged to move legs frequently and increase activity (walk) slowly.  Pt also complains of feeling tired.  She lays mostly in bed during the day. Therefore patient was again encouraged to increase activity, but increase slowly and rest as needed.  She was also advised to eat and drink plenty of fluids.  She stated understanding and agreed.  Otherwise, patient is doing well.  No complaints of fever, chest pain, shortness of breath, or elevated HR.     Do you understand why you were in the hospital? yes   Do you understand the discharge instructions? yes  Items Reviewed:  Medications reviewed: yes  Allergies reviewed: yes  Dietary changes reviewed: yes, low sodium heart health diet  Referrals reviewed: yes   Functional Questionnaire:   Activities of Daily Living (ADLs):   She states they are independent in the following: n/a States they require assistance with the following: ambulation, bathing and hygiene, feeding, continence, grooming, toileting and dressing---sisters assist with care   Any transportation issues/concerns?: no   Any  patient concerns? yes, pain management   Confirmed importance and date/time of follow-up visits scheduled: yes   Confirmed with patient if condition begins to worsen call PCP or go to the ER: yes  Hospital follow up appointment scheduled for Friday, 08/18/14 @ 10:30 am with Dr. [name].

## 2014-08-10 NOTE — Care Management Note (Signed)
CARE MANAGEMENT NOTE 08/10/2014  Patient:  GLENNDA, WEATHERHOLTZ   Account Number:  [id number]  Date Initiated:  08/08/2014  Documentation initiated by:  Dusti Tetro  Subjective/Objective Assessment:   CM following for progression and d/c planning.     Action/Plan:   08/09/2014 Met with pt to resume [facility] services, however pt unable to recall the name of her current [facility] agency.  08/10/2014 Pt called this CM with phone # for [facility] agency. Please see below.   Anticipated DC Date:  08/09/2014   Anticipated DC Plan:  [place]         Choice offered to / List presented to:          [facility] arranged  HH-2 PT  HH-3 OT      Status of service:  Completed, signed off Medicare Important Message given?  NA - LOS <3 / Initial given by admissions (If response is "NO", the following Medicare IM given date fields will be blank) Date Medicare IM given:   Medicare IM given by:   Date Additional Medicare IM given:   Additional Medicare IM given by:    Discharge Disposition:    Per UR Regulation:    If discussed at Long Length of Stay Meetings, dates discussed:    Comments:  08/10/2014 Pt unable to provide CM with name of [place] agency in order to resume [facility] services on 08/09/2014 prior to d/c. Pt called this CM with name of [place] agency, [facility] and phone number [phone]. This CM contacted that agency and faxed d/c summery and orders to resume [facility] services to [phone].  CRoyal RN MPH, case manager, [phone]

## 2014-08-10 NOTE — Telephone Encounter (Signed)
First available is a couple months out. Do you want patient to be seen sooner? If so, when can I schedule patient to be seen?

## 2014-08-10 NOTE — Telephone Encounter (Signed)
Ok to re-establish 

## 2014-08-10 NOTE — Telephone Encounter (Signed)
Caller name: Karla Relation to pt: self  Call back number: [phone] Pharmacy:  Reason for call:   Patient states that she used to be a patient of Dr. Virgil Benedict but went elsewhere for care. Now, the dr that she is currently seeing has moved and would like to know if she can re-establish with Dr. [name].

## 2014-08-14 DIAGNOSIS — Z4802 Encounter for removal of sutures: Secondary | ICD-10-CM | POA: Diagnosis not present

## 2014-08-17 DIAGNOSIS — M40204 Unspecified kyphosis, thoracic region: Secondary | ICD-10-CM | POA: Diagnosis not present

## 2014-08-17 DIAGNOSIS — Z4789 Encounter for other orthopedic aftercare: Secondary | ICD-10-CM | POA: Diagnosis not present

## 2014-08-17 DIAGNOSIS — F419 Anxiety disorder, unspecified: Secondary | ICD-10-CM | POA: Diagnosis not present

## 2014-08-17 DIAGNOSIS — G629 Polyneuropathy, unspecified: Secondary | ICD-10-CM | POA: Diagnosis not present

## 2014-08-17 DIAGNOSIS — G8929 Other chronic pain: Secondary | ICD-10-CM | POA: Diagnosis not present

## 2014-08-17 DIAGNOSIS — M1990 Unspecified osteoarthritis, unspecified site: Secondary | ICD-10-CM | POA: Diagnosis not present

## 2014-08-18 ENCOUNTER — Ambulatory Visit (INDEPENDENT_AMBULATORY_CARE_PROVIDER_SITE_OTHER): Payer: Medicare Other | Admitting: Family Medicine

## 2014-08-18 ENCOUNTER — Encounter: Payer: Self-pay | Admitting: Family Medicine

## 2014-08-18 ENCOUNTER — Telehealth: Payer: Self-pay | Admitting: Family Medicine

## 2014-08-18 VITALS — BP 104/80 | HR 115 | Temp 97.9°F | Resp 16 | Wt 120.2 lb

## 2014-08-18 DIAGNOSIS — Z4789 Encounter for other orthopedic aftercare: Secondary | ICD-10-CM | POA: Diagnosis not present

## 2014-08-18 DIAGNOSIS — F419 Anxiety disorder, unspecified: Secondary | ICD-10-CM

## 2014-08-18 DIAGNOSIS — R600 Localized edema: Secondary | ICD-10-CM

## 2014-08-18 DIAGNOSIS — G8929 Other chronic pain: Secondary | ICD-10-CM | POA: Diagnosis not present

## 2014-08-18 DIAGNOSIS — G629 Polyneuropathy, unspecified: Secondary | ICD-10-CM | POA: Diagnosis not present

## 2014-08-18 DIAGNOSIS — Z981 Arthrodesis status: Secondary | ICD-10-CM | POA: Diagnosis not present

## 2014-08-18 DIAGNOSIS — D509 Iron deficiency anemia, unspecified: Secondary | ICD-10-CM | POA: Diagnosis not present

## 2014-08-18 DIAGNOSIS — M40204 Unspecified kyphosis, thoracic region: Secondary | ICD-10-CM | POA: Diagnosis not present

## 2014-08-18 DIAGNOSIS — M1990 Unspecified osteoarthritis, unspecified site: Secondary | ICD-10-CM | POA: Diagnosis not present

## 2014-08-18 LAB — HEPATIC FUNCTION PANEL
ALT: 8 U/L (ref 0–35)
AST: 19 U/L (ref 0–37)
Albumin: 3.6 g/dL (ref 3.5–5.2)
Alkaline Phosphatase: 142 U/L — ABNORMAL HIGH (ref 39–117)
Bilirubin, Direct: 0.1 mg/dL (ref 0.0–0.3)
Total Bilirubin: 0.4 mg/dL (ref 0.2–1.2)
Total Protein: 7.3 g/dL (ref 6.0–8.3)

## 2014-08-18 LAB — BASIC METABOLIC PANEL
BUN: 16 mg/dL (ref 6–23)
CO2: 33 mEq/L — ABNORMAL HIGH (ref 19–32)
Calcium: 9.6 mg/dL (ref 8.4–10.5)
Chloride: 100 mEq/L (ref 96–112)
Creatinine, Ser: 0.61 mg/dL (ref 0.40–1.20)
GFR: 103.05 mL/min (ref >60.00)
Glucose, Bld: 101 mg/dL — ABNORMAL HIGH (ref 70–99)
Potassium: 4.3 mEq/L (ref 3.5–5.1)
Sodium: 137 mEq/L (ref 135–145)

## 2014-08-18 LAB — CBC WITH DIFFERENTIAL/PLATELET
Basophils Absolute: 0 10*3/uL (ref 0.0–0.1)
Basophils Relative: 0.4 % (ref 0.0–3.0)
Eosinophils Absolute: 0 10*3/uL (ref 0.0–0.7)
Eosinophils Relative: 0.7 % (ref 0.0–5.0)
HCT: 31.2 % — ABNORMAL LOW (ref 36.0–46.0)
Hemoglobin: 10.2 g/dL — ABNORMAL LOW (ref 12.0–15.0)
Lymphocytes Relative: 26.2 % (ref 12.0–46.0)
Lymphs Abs: 1.1 10*3/uL (ref 0.7–4.0)
MCHC: 32.8 g/dL (ref 30.0–36.0)
MCV: 87.6 fl (ref 78.0–100.0)
Monocytes Absolute: 0.3 10*3/uL (ref 0.1–1.0)
Monocytes Relative: 7.3 % (ref 3.0–12.0)
Neutro Abs: 2.9 10*3/uL (ref 1.4–7.7)
Neutrophils Relative %: 65.4 % (ref 43.0–77.0)
Platelets: 400 10*3/uL (ref 150.0–400.0)
RBC: 3.56 Mil/uL — ABNORMAL LOW (ref 3.87–5.11)
RDW: 17.3 % — ABNORMAL HIGH (ref 11.5–15.5)
WBC: 4.4 10*3/uL (ref 4.0–10.5)

## 2014-08-18 MED ORDER — SERTRALINE HCL 25 MG PO TABS: 25.0000 mg | ORAL_TABLET | Freq: Every day | ORAL | Status: DC

## 2014-08-18 MED ORDER — POTASSIUM CHLORIDE ER 20 MEQ PO TBCR: 10.0000 meq | EXTENDED_RELEASE_TABLET | Freq: Every day | ORAL | Status: DC

## 2014-08-18 MED ORDER — POLYSACCHARIDE IRON COMPLEX 150 MG PO CAPS: 150.0000 mg | ORAL_CAPSULE | Freq: Every day | ORAL | Status: DC

## 2014-08-18 NOTE — Progress Notes (Signed)
Pre visit review using our clinic review tool, if applicable. No additional management support is needed unless otherwise documented below in the visit note. 

## 2014-08-18 NOTE — Assessment & Plan Note (Signed)
Noted during recent hospitalization.  Pt has received multiple transfusions during her recent surgeries.  Suspect this is contributing to her edema.  Repeat CBC today.  Continue iron for now.  Will follow.

## 2014-08-18 NOTE — Progress Notes (Signed)
   Subjective:    Patient ID: [name], female    DOB: [birth year], 69 y.o.   MRN: [id number]  [facility] f/u- pt was admitted on 1/31 and d/c'd on 2/3 due to worsening LE edema after neck surgery.  Had complete w/u which showed normal BNP, troponin, EF on ECHO, LE doppler.  Labs showed she was anemic w/ low albumin which suggests 3rd spacing.  Pt had multiple transfusions during her recent surgeries which could also point to volume overload.  Pt reports leg edema has improved since d/c but ankle and foot edema continues.  Was started on iron and Lasix.  Pt has started protein shakes daily.  Pt is starting PT.  'i can't relax'.  On Klonopin w/o improvement.  Hx of anxiety.  On Oxycodone for pain.   Review of Systems For ROS see HPI   Reviewed meds, allergies, problem list, and PMH in chart     Objective:   Physical Exam  Constitutional: She is oriented to person, place, and time.  Thin, frail appearing  HENT:  Head: Normocephalic and atraumatic.  Neck:  Neck brace in place  Cardiovascular: Normal rate, regular rhythm and intact distal pulses.   Hyperdynamic heart sounds  Pulmonary/Chest: Effort normal and breath sounds normal. No respiratory distress. She has no wheezes. She has no rales.  Musculoskeletal: She exhibits edema (1-2+ edema of feet bilaterally, no lower leg edema).  Neurological: She is alert and oriented to person, place, and time.  Skin: Skin is warm and dry.  Psychiatric:  anxious  Vitals reviewed.         Assessment & Plan:

## 2014-08-18 NOTE — Patient Instructions (Signed)
Follow up in 4-6 weeks to recheck anxiety We'll notify you of your lab results and make any changes if needed Continue the daily Lasix (fluid pill), increase your water intake and elevate your legs Make sure you are eating regularly- high protein if possible (shakes are a great way to do this) Start the Zoloft daily for the anxiety Call your surgeon about pain control Call with any questions or concerns Hang in there!!

## 2014-08-18 NOTE — Assessment & Plan Note (Signed)
New to provider.  Suspect pt's edema is due to low protein stores, anemia, and recent aggressive IVF during multiple procedures.  Encouraged increased protein in diet, continued use of lasix, elevation as able.  Will repeat labs today and adjust diuretics prn.

## 2014-08-18 NOTE — Telephone Encounter (Signed)
Pt informed that she needed to take all of her medications, would be notified if any doses change.

## 2014-08-18 NOTE — Assessment & Plan Note (Signed)
Severe.  Pt has struggled w/ this previously and has been on multiple medications.  Not currently on SSRI for daily control- relying solely on Klonopin.  Again reviewed the dangers of this.  Will start low dose Zoloft and titrate as needed.  Will follow closely.

## 2014-08-18 NOTE — Assessment & Plan Note (Signed)
New to provider.  Encouraged pt to discuss her pain management w/ surgeon.

## 2014-08-18 NOTE — Telephone Encounter (Signed)
Caller name: Waynette Relation to pt: self  Call back number: [phone]  Pharmacy:  Reason for call:   Patient states that she already took the fluid pill as discussed from earlier visit. Does she needs to hold off on everything else until labs come back in???

## 2014-08-21 ENCOUNTER — Other Ambulatory Visit (INDEPENDENT_AMBULATORY_CARE_PROVIDER_SITE_OTHER): Payer: Medicare Other

## 2014-08-21 DIAGNOSIS — R748 Abnormal levels of other serum enzymes: Secondary | ICD-10-CM | POA: Diagnosis not present

## 2014-08-21 LAB — GAMMA GT: GGT: 14 U/L (ref 7–51)

## 2014-08-21 NOTE — Telephone Encounter (Signed)
Hx.  Recent neck surgery and hospitalization, anxiety, and iron deficiency anemia  C/O:  Pt states she feels like she has been ran over by a [employer] truck.  She feels very tired and drained.  States she has a hard time sitting up for very long due to neck brace.  Also, yesterday and today she felt nauseated and is not sure if it is coming from the medications she's taking (oxycodone, iron, potassium).  She also stated that all of her "heart test" came back good and she wanted to know if she should continue taking her carvedilol as prescribed.  She said that she cut back on the medication thinking that it would help her to feel better.     Advice:  Pt was advised to continue taking ALL medications as prescribed and not to make any adjustments without consent from the provider.  She was also advised to eat more especially when taking medications as this will help with the nausea as well as provide her with the nutrients needed for energy.  She was encouraged to increase activity, but to increase slowly and take frequent rest breaks as needed.  We also discussed her labs.  She was made aware that her labs showed improvement and that Dr. [name] does not want to make any changes at this time.    Pt stated understanding.  No further questions or concerns were voiced.  She was encouraged to call with further concerns.  Pt agreed she would.

## 2014-08-21 NOTE — Telephone Encounter (Signed)
Caller name: Micaila, Ziemba Relation to pt: self  Call back number: [phone]   Reason for call:  Pt in need of clinical advice pt states she is [place] and tired and doesn't know if its the pottassium or iron or oxcodyone. Pt states she is drained and inquiring if she should continue the pulse rate medication. Pt states she doesnt feel like getting out of bed.

## 2014-08-21 NOTE — Telephone Encounter (Signed)
Agree w/ advice given 

## 2014-08-22 DIAGNOSIS — F419 Anxiety disorder, unspecified: Secondary | ICD-10-CM | POA: Diagnosis not present

## 2014-08-22 DIAGNOSIS — M40204 Unspecified kyphosis, thoracic region: Secondary | ICD-10-CM | POA: Diagnosis not present

## 2014-08-22 DIAGNOSIS — Z4789 Encounter for other orthopedic aftercare: Secondary | ICD-10-CM | POA: Diagnosis not present

## 2014-08-22 DIAGNOSIS — M1990 Unspecified osteoarthritis, unspecified site: Secondary | ICD-10-CM | POA: Diagnosis not present

## 2014-08-22 DIAGNOSIS — G8929 Other chronic pain: Secondary | ICD-10-CM | POA: Diagnosis not present

## 2014-08-22 DIAGNOSIS — G629 Polyneuropathy, unspecified: Secondary | ICD-10-CM | POA: Diagnosis not present

## 2014-08-22 NOTE — Telephone Encounter (Signed)
Patient called back with same concerns as before. She states that she has decreased appetite, fatigue, and anxiety. Best # [phone]

## 2014-08-23 MED ORDER — ONDANSETRON HCL 4 MG PO TABS: 4.0000 mg | ORAL_TABLET | Freq: Three times a day (TID) | ORAL | Status: DC | PRN

## 2014-08-23 NOTE — Telephone Encounter (Signed)
Ok for Zofran 4mg  TID PRN, #45, no refills The energy level will take time to rebound.  There is nothing extra to do at this time

## 2014-08-23 NOTE — Telephone Encounter (Signed)
Caller name: [name], [place] Relation to pt: self  Call back number: [phone]   Reason for call:  Pt states she feels nausea and fatigue in need of clinical advice.

## 2014-08-23 NOTE — Telephone Encounter (Signed)
Rx sent to pharmacy.  Pt made aware.  She was also made aware that it will take time for energy level to increase and that Dr. [name] does not recommend pt do anything different at this time.

## 2014-08-23 NOTE — Telephone Encounter (Signed)
Pt states she's is eating more, but eating only 1/2 of what she normally would eat, she's drinking Ensure, increasing her activity and laying in bed less, and her VSS.  Pt would like something for the nausea and wants to know if there is anything else she can do to increase her energy level.  Please advise.

## 2014-08-24 DIAGNOSIS — G8929 Other chronic pain: Secondary | ICD-10-CM | POA: Diagnosis not present

## 2014-08-24 DIAGNOSIS — M40204 Unspecified kyphosis, thoracic region: Secondary | ICD-10-CM | POA: Diagnosis not present

## 2014-08-24 DIAGNOSIS — G629 Polyneuropathy, unspecified: Secondary | ICD-10-CM | POA: Diagnosis not present

## 2014-08-24 DIAGNOSIS — Z4789 Encounter for other orthopedic aftercare: Secondary | ICD-10-CM | POA: Diagnosis not present

## 2014-08-24 DIAGNOSIS — F419 Anxiety disorder, unspecified: Secondary | ICD-10-CM | POA: Diagnosis not present

## 2014-08-24 DIAGNOSIS — M1990 Unspecified osteoarthritis, unspecified site: Secondary | ICD-10-CM | POA: Diagnosis not present

## 2014-08-25 DIAGNOSIS — M1990 Unspecified osteoarthritis, unspecified site: Secondary | ICD-10-CM | POA: Diagnosis not present

## 2014-08-25 DIAGNOSIS — G629 Polyneuropathy, unspecified: Secondary | ICD-10-CM | POA: Diagnosis not present

## 2014-08-25 DIAGNOSIS — M40204 Unspecified kyphosis, thoracic region: Secondary | ICD-10-CM | POA: Diagnosis not present

## 2014-08-25 DIAGNOSIS — F419 Anxiety disorder, unspecified: Secondary | ICD-10-CM | POA: Diagnosis not present

## 2014-08-25 DIAGNOSIS — G8929 Other chronic pain: Secondary | ICD-10-CM | POA: Diagnosis not present

## 2014-08-25 DIAGNOSIS — Z4789 Encounter for other orthopedic aftercare: Secondary | ICD-10-CM | POA: Diagnosis not present

## 2014-08-28 DIAGNOSIS — G8929 Other chronic pain: Secondary | ICD-10-CM | POA: Diagnosis not present

## 2014-08-28 DIAGNOSIS — G629 Polyneuropathy, unspecified: Secondary | ICD-10-CM | POA: Diagnosis not present

## 2014-08-28 DIAGNOSIS — M1990 Unspecified osteoarthritis, unspecified site: Secondary | ICD-10-CM | POA: Diagnosis not present

## 2014-08-28 DIAGNOSIS — F419 Anxiety disorder, unspecified: Secondary | ICD-10-CM | POA: Diagnosis not present

## 2014-08-28 DIAGNOSIS — M40204 Unspecified kyphosis, thoracic region: Secondary | ICD-10-CM | POA: Diagnosis not present

## 2014-08-28 DIAGNOSIS — Z4789 Encounter for other orthopedic aftercare: Secondary | ICD-10-CM | POA: Diagnosis not present

## 2014-08-30 ENCOUNTER — Telehealth: Payer: Self-pay | Admitting: Family Medicine

## 2014-08-30 NOTE — Telephone Encounter (Signed)
Caller name:Dorette Luciana Axe Relationship to patient:self Can be [phone] Pharmacy: [facility] [address] [place]  Reason for call: PT has appt tomorrow but having severe loose stools- states no fever but not able to stay out of the bathroom. Taking pepto - not working. Severe fatigue due to this, but states no fever. Wanting to see what she can take before appt tomorrow.

## 2014-08-30 NOTE — Telephone Encounter (Signed)
Ok for immodium to slow stools.

## 2014-08-30 NOTE — Telephone Encounter (Signed)
Spoke with pt and advised of the recommendation. Pt states that she is able to eat and drink.

## 2014-08-31 ENCOUNTER — Ambulatory Visit: Payer: Medicare Other | Admitting: Family Medicine

## 2014-09-01 ENCOUNTER — Encounter: Payer: Self-pay | Admitting: *Deleted

## 2014-09-01 ENCOUNTER — Ambulatory Visit (INDEPENDENT_AMBULATORY_CARE_PROVIDER_SITE_OTHER): Payer: Medicare Other | Admitting: Family Medicine

## 2014-09-01 ENCOUNTER — Encounter: Payer: Self-pay | Admitting: Family Medicine

## 2014-09-01 VITALS — BP 105/67 | HR 65 | Temp 98.1°F | Resp 18 | Ht 67.0 in | Wt 119.0 lb

## 2014-09-01 DIAGNOSIS — R197 Diarrhea, unspecified: Secondary | ICD-10-CM | POA: Diagnosis not present

## 2014-09-01 DIAGNOSIS — F419 Anxiety disorder, unspecified: Secondary | ICD-10-CM | POA: Diagnosis not present

## 2014-09-01 LAB — CBC WITH DIFFERENTIAL/PLATELET
Basophils Absolute: 0 10*3/uL (ref 0.0–0.1)
Basophils Relative: 0.4 % (ref 0.0–3.0)
Eosinophils Absolute: 0.1 10*3/uL (ref 0.0–0.7)
Eosinophils Relative: 1.8 % (ref 0.0–5.0)
HCT: 34.2 % — ABNORMAL LOW (ref 36.0–46.0)
Hemoglobin: 11.2 g/dL — ABNORMAL LOW (ref 12.0–15.0)
Lymphocytes Relative: 30.8 % (ref 12.0–46.0)
Lymphs Abs: 2.1 10*3/uL (ref 0.7–4.0)
MCHC: 32.8 g/dL (ref 30.0–36.0)
MCV: 85.5 fl (ref 78.0–100.0)
Monocytes Absolute: 0.5 10*3/uL (ref 0.1–1.0)
Monocytes Relative: 7 % (ref 3.0–12.0)
Neutro Abs: 4 10*3/uL (ref 1.4–7.7)
Neutrophils Relative %: 60 % (ref 43.0–77.0)
Platelets: 274 10*3/uL (ref 150.0–400.0)
RBC: 4 Mil/uL (ref 3.87–5.11)
RDW: 16.6 % — ABNORMAL HIGH (ref 11.5–15.5)
WBC: 6.7 10*3/uL (ref 4.0–10.5)

## 2014-09-01 LAB — BASIC METABOLIC PANEL
BUN: 15 mg/dL (ref 6–23)
CO2: 30 mEq/L (ref 19–32)
Calcium: 9.7 mg/dL (ref 8.4–10.5)
Chloride: 102 mEq/L (ref 96–112)
Creatinine, Ser: 0.64 mg/dL (ref 0.40–1.20)
GFR: 97.48 mL/min (ref >60.00)
Glucose, Bld: 100 mg/dL — ABNORMAL HIGH (ref 70–99)
Potassium: 4.7 mEq/L (ref 3.5–5.1)
Sodium: 138 mEq/L (ref 135–145)

## 2014-09-01 LAB — HEPATIC FUNCTION PANEL
ALT: 6 U/L (ref 0–35)
AST: 15 U/L (ref 0–37)
Albumin: 3.8 g/dL (ref 3.5–5.2)
Alkaline Phosphatase: 117 U/L (ref 39–117)
Bilirubin, Direct: 0.1 mg/dL (ref 0.0–0.3)
Total Bilirubin: 0.5 mg/dL (ref 0.2–1.2)
Total Protein: 7.1 g/dL (ref 6.0–8.3)

## 2014-09-01 MED ORDER — CLONAZEPAM 0.5 MG PO TABS: 0.2500 mg | ORAL_TABLET | Freq: Two times a day (BID) | ORAL | Status: DC | PRN

## 2014-09-01 NOTE — Progress Notes (Signed)
   Subjective:    Patient ID: [name], female    DOB: 18-Sep-1945, 70 y.o.   MRN: [id number]  HPI Anxiety- chronic problem, was started on Zoloft last visit, has taken for 11 days.  Pt continues to c/o of excessive fatigue.  Pt is on iron and K+.    Diarrhea- sxs started 3 days ago.  Started Immodium 2 days ago w/ improvement.  No vomiting.  No fevers.  No abdominal pain.  'i'm just so tired'.  Pt was recently hospitalized w/ major neck surgery.   Review of Systems For ROS see HPI     Objective:   Physical Exam  Constitutional: She is oriented to person, place, and time. She appears well-developed and well-nourished. No distress.  HENT:  Head: Normocephalic and atraumatic.  MMM  Neck: Neck supple.  Cardiovascular: Normal rate, regular rhythm and intact distal pulses.   Pulmonary/Chest: Effort normal and breath sounds normal. No respiratory distress. She has no wheezes. She has no rales.  Abdominal: Soft. She exhibits no distension. There is no tenderness. There is no rebound.  Hyperactive BS  Lymphadenopathy:    She has no cervical adenopathy.  Neurological: She is alert and oriented to person, place, and time.  Skin: Skin is warm and dry.  Psychiatric:  anxious  Vitals reviewed.         Assessment & Plan:

## 2014-09-01 NOTE — Patient Instructions (Signed)
Follow up in 6-8 weeks to recheck symptoms We'll notify you of your lab results and make any changes if needed Continue the Zoloft daily Use the Klonopin only as needed Allow yourself time to rest but try and increase your activity level at least weekly- this will take time! Continue Immodium as needed Drink plenty of fluids Call with any questions or concerns Hang in there!

## 2014-09-01 NOTE — Progress Notes (Signed)
Pre visit review using our clinic review tool, if applicable. No additional management support is needed unless otherwise documented below in the visit note. 

## 2014-09-03 NOTE — Assessment & Plan Note (Signed)
Unchanged.  Again explained to pt and her sister that this is her biggest issue right now and unless it is better controlled, it will interfere w/ her recovery.  Refill provided on pt's Klonopin.  Stressed need for her to adjust her expectations of her recovery b/c neurosurg told her it would take up to 12 months to return to baseline and pt is <8 weeks out.  Will continue Zoloft at present dose since she has only taken 11 days of meds.  Will follow closely.

## 2014-09-03 NOTE — Assessment & Plan Note (Signed)
New.  Due to her recent hospitalization, will r/o C diff.  Start Immodium prn.  Reviewed supportive care.  Reviewed supportive care and red flags that should prompt return.  Pt expressed understanding and is in agreement w/ plan.

## 2014-09-04 ENCOUNTER — Telehealth: Payer: Self-pay | Admitting: Family Medicine

## 2014-09-04 ENCOUNTER — Other Ambulatory Visit: Payer: Self-pay | Admitting: Family Medicine

## 2014-09-04 DIAGNOSIS — R197 Diarrhea, unspecified: Secondary | ICD-10-CM | POA: Diagnosis not present

## 2014-09-04 MED ORDER — PANTOPRAZOLE SODIUM 40 MG PO TBEC: 40.0000 mg | DELAYED_RELEASE_TABLET | Freq: Two times a day (BID) | ORAL | Status: DC | PRN

## 2014-09-04 NOTE — Telephone Encounter (Signed)
Pt notified, and new rx sent to pharmacy that was written bid prn

## 2014-09-04 NOTE — Addendum Note (Signed)
Addended by: Peggyann Shoals on: 09/04/2014 11:09 AM   Modules accepted: Orders

## 2014-09-04 NOTE — Telephone Encounter (Signed)
Caller name:Avanthika Luciana Axe Relationship to patient:Self Can be reached:[phone] Pharmacy:  Reason for call: PT still having loose stools- states someone will be bringing stool sample today for her. States that over the weekend she has had bad reflux- every time she eats pain and burning.

## 2014-09-04 NOTE — Telephone Encounter (Signed)
Is she still taking the Protonix daily?  If yes, can increase to twice daily.  Waiting on stool study results

## 2014-09-05 ENCOUNTER — Telehealth: Payer: Self-pay

## 2014-09-05 DIAGNOSIS — Z4789 Encounter for other orthopedic aftercare: Secondary | ICD-10-CM | POA: Diagnosis not present

## 2014-09-05 DIAGNOSIS — G8929 Other chronic pain: Secondary | ICD-10-CM | POA: Diagnosis not present

## 2014-09-05 DIAGNOSIS — M40204 Unspecified kyphosis, thoracic region: Secondary | ICD-10-CM | POA: Diagnosis not present

## 2014-09-05 DIAGNOSIS — F419 Anxiety disorder, unspecified: Secondary | ICD-10-CM | POA: Diagnosis not present

## 2014-09-05 DIAGNOSIS — G629 Polyneuropathy, unspecified: Secondary | ICD-10-CM | POA: Diagnosis not present

## 2014-09-05 DIAGNOSIS — M1990 Unspecified osteoarthritis, unspecified site: Secondary | ICD-10-CM | POA: Diagnosis not present

## 2014-09-05 LAB — C. DIFFICILE GDH AND TOXIN A/B
C. difficile GDH: DETECTED — AB
C. difficile Toxin A/B: NOT DETECTED

## 2014-09-05 LAB — CLOSTRIDIUM DIFFICILE BY PCR: Toxigenic C. Difficile by PCR: DETECTED — CR

## 2014-09-05 MED ORDER — METRONIDAZOLE 500 MG PO TABS: 500.0000 mg | ORAL_TABLET | Freq: Three times a day (TID) | ORAL | Status: DC

## 2014-09-05 NOTE — Telephone Encounter (Signed)
Pt needs to start Flagyl 500mg  TID x10 days, #30, no refills to treat her C diff diarrhea.  Please call pt and send script

## 2014-09-05 NOTE — Telephone Encounter (Signed)
[name] from [facility] called to report the following the lab:  C. Diff detected.

## 2014-09-05 NOTE — Telephone Encounter (Addendum)
Pt notified and made aware. She was also advised of the measure to take to avoid the spread of C. Diff (hand washing and disinfect bathroom and kitchen surfaces with Clorox bleach).  Pt stated understanding and agreed with plan.  Rx sent to pharmacy.

## 2014-09-06 DIAGNOSIS — G629 Polyneuropathy, unspecified: Secondary | ICD-10-CM | POA: Diagnosis not present

## 2014-09-06 DIAGNOSIS — M1990 Unspecified osteoarthritis, unspecified site: Secondary | ICD-10-CM | POA: Diagnosis not present

## 2014-09-06 DIAGNOSIS — G8929 Other chronic pain: Secondary | ICD-10-CM | POA: Diagnosis not present

## 2014-09-06 DIAGNOSIS — Z4789 Encounter for other orthopedic aftercare: Secondary | ICD-10-CM | POA: Diagnosis not present

## 2014-09-06 DIAGNOSIS — M40204 Unspecified kyphosis, thoracic region: Secondary | ICD-10-CM | POA: Diagnosis not present

## 2014-09-06 DIAGNOSIS — F419 Anxiety disorder, unspecified: Secondary | ICD-10-CM | POA: Diagnosis not present

## 2014-09-07 ENCOUNTER — Telehealth: Payer: Self-pay | Admitting: Family Medicine

## 2014-09-07 DIAGNOSIS — Z4789 Encounter for other orthopedic aftercare: Secondary | ICD-10-CM | POA: Diagnosis not present

## 2014-09-07 NOTE — Telephone Encounter (Signed)
The medicine she thinks is causing a headache she has a heavy film in her mouth and she has no appetite left.

## 2014-09-07 NOTE — Telephone Encounter (Signed)
Pt.notified

## 2014-09-07 NOTE — Telephone Encounter (Signed)
This medication may cause nausea and decreased appetite but is necessary to treat her current C diff infection.  Increase fluid intake to help w/ headaches and film in mouth.

## 2014-09-08 ENCOUNTER — Ambulatory Visit: Payer: Medicare Other | Admitting: Family Medicine

## 2014-09-08 ENCOUNTER — Telehealth: Payer: Self-pay | Admitting: Family Medicine

## 2014-09-08 DIAGNOSIS — F419 Anxiety disorder, unspecified: Secondary | ICD-10-CM | POA: Diagnosis not present

## 2014-09-08 DIAGNOSIS — G629 Polyneuropathy, unspecified: Secondary | ICD-10-CM | POA: Diagnosis not present

## 2014-09-08 DIAGNOSIS — G8929 Other chronic pain: Secondary | ICD-10-CM | POA: Diagnosis not present

## 2014-09-08 DIAGNOSIS — M40204 Unspecified kyphosis, thoracic region: Secondary | ICD-10-CM | POA: Diagnosis not present

## 2014-09-08 DIAGNOSIS — M1990 Unspecified osteoarthritis, unspecified site: Secondary | ICD-10-CM | POA: Diagnosis not present

## 2014-09-08 DIAGNOSIS — Z4789 Encounter for other orthopedic aftercare: Secondary | ICD-10-CM | POA: Diagnosis not present

## 2014-09-08 NOTE — Telephone Encounter (Signed)
Her abd pain is most likely due to the C diff infection although Flagyl can contribute.  Drink plenty of fluids, eat as she is able.  If concerns for dehydration or weakness, will need to go to ER for evaluation and fluids

## 2014-09-08 NOTE — Telephone Encounter (Signed)
Caller name: Gracilyn Relation to pt: self Call back number: [phone] Pharmacy:  Reason for call:   Patient cancelled appointment for this morning stating that she didn't feel well enough to come in. She says that her stomach is hurting really bad and wanted to know if this could be a side effect to metronidazole?

## 2014-09-08 NOTE — Telephone Encounter (Signed)
Notified patient.  She states understanding, and shensays that the abdominal pain is tolerable to her-she just wanted to know if it was common.  She states she is drinking plenty of fluids but has gone to the restroom several times this morning.  Notified patient that if she feels weak, dizzy, shaky, dry mouth, etc to go to the ED.  Patient expressed understanding and agreed.

## 2014-09-11 ENCOUNTER — Encounter: Payer: Self-pay | Admitting: Family Medicine

## 2014-09-11 ENCOUNTER — Ambulatory Visit (INDEPENDENT_AMBULATORY_CARE_PROVIDER_SITE_OTHER): Payer: Medicare Other | Admitting: Family Medicine

## 2014-09-11 VITALS — BP 120/78 | HR 103 | Temp 98.0°F | Resp 16 | Wt 123.1 lb

## 2014-09-11 DIAGNOSIS — L89109 Pressure ulcer of unspecified part of back, unspecified stage: Secondary | ICD-10-CM | POA: Insufficient documentation

## 2014-09-11 DIAGNOSIS — A0472 Enterocolitis due to Clostridium difficile, not specified as recurrent: Secondary | ICD-10-CM | POA: Insufficient documentation

## 2014-09-11 DIAGNOSIS — A047 Enterocolitis due to Clostridium difficile: Secondary | ICD-10-CM

## 2014-09-11 DIAGNOSIS — L89101 Pressure ulcer of unspecified part of back, stage 1: Secondary | ICD-10-CM | POA: Diagnosis not present

## 2014-09-11 NOTE — Progress Notes (Signed)
Pre visit review using our clinic review tool, if applicable. No additional management support is needed unless otherwise documented below in the visit note. 

## 2014-09-11 NOTE — Patient Instructions (Signed)
Follow up in 2 weeks to recheck the area on your back This is the start of a pressure sore and you need to make sure that you are not leaning back against your R shoulder blade (try and frequently reposition yourself and if you must lean back, lean on the L shoulder) Please have home health look at the area so that they are able to monitor it in between our visits and help you with your positioning STOP the iron until further notice Call with any questions or concerns Hang in there!

## 2014-09-11 NOTE — Assessment & Plan Note (Signed)
New.  Suspect this is due to pt's limited mobility and regular positioning against her R shoulder blade.  Pt has no feeling in the area which further complicates the area b/c she cannot feel if the area is worsening or uncomfortable.  Pt to call back w/ name of home health agency so we can alert them to the problem and they can help her work on positioning.  Reviewed supportive care and red flags that should prompt return.  Pt expressed understanding and is in agreement w/ plan.

## 2014-09-11 NOTE — Progress Notes (Signed)
   Subjective:    Patient ID: [name], female    DOB: [birth year], 69 y.o.   MRN: [id number]  HPI Red spot- pt called the surgeon and they did not feel that area on back was surgical in nature.  Pt is unable to determine if area is painful b/c 'that's where the nerve was cut'.  Pt reports it has darkened in color.    C diff- pt reports that stools are less in volume and frequency.  Pt reports the Flagyl causes nausea.  Pt has hx of diarrhea on iron.  Asking if she can hold for now.  Review of Systems For ROS see HPI     Objective:   Physical Exam  Constitutional: She is oriented to person, place, and time.  Frail, older woman w/ neck brace in place  Neck:  Neck brace in place  Abdominal: Soft. Bowel sounds are normal. She exhibits no distension. There is no tenderness. There is no rebound.  Neurological: She is alert and oriented to person, place, and time.  Skin: Skin is warm and dry. There is erythema (oblong shaped area of bruising surrounding by erythematous margin just R of spine- no evidence of cellulitis but area is consistent w/ early stage I pressure sore ).  Psychiatric:  Tearful, anxious  Vitals reviewed.         Assessment & Plan:

## 2014-09-11 NOTE — Assessment & Plan Note (Signed)
New.  Pt is taking the Flagyl w/ a lot of prodding and coaxing on our part.  Again explained that nausea w/ flagyl is not uncommon.  Encouraged her to hold her iron supplement while on the flagyl as this is likely compounding her side effects.  Pt expressed understanding.

## 2014-09-12 ENCOUNTER — Ambulatory Visit: Payer: Medicare Other | Admitting: Family Medicine

## 2014-09-12 ENCOUNTER — Telehealth: Payer: Self-pay

## 2014-09-12 ENCOUNTER — Other Ambulatory Visit: Payer: Self-pay | Admitting: Family Medicine

## 2014-09-12 DIAGNOSIS — L89101 Pressure ulcer of unspecified part of back, stage 1: Secondary | ICD-10-CM

## 2014-09-12 DIAGNOSIS — G629 Polyneuropathy, unspecified: Secondary | ICD-10-CM | POA: Diagnosis not present

## 2014-09-12 DIAGNOSIS — M1990 Unspecified osteoarthritis, unspecified site: Secondary | ICD-10-CM | POA: Diagnosis not present

## 2014-09-12 DIAGNOSIS — M40204 Unspecified kyphosis, thoracic region: Secondary | ICD-10-CM | POA: Diagnosis not present

## 2014-09-12 DIAGNOSIS — Z4789 Encounter for other orthopedic aftercare: Secondary | ICD-10-CM | POA: Diagnosis not present

## 2014-09-12 DIAGNOSIS — F419 Anxiety disorder, unspecified: Secondary | ICD-10-CM | POA: Diagnosis not present

## 2014-09-12 DIAGNOSIS — G8929 Other chronic pain: Secondary | ICD-10-CM | POA: Diagnosis not present

## 2014-09-12 NOTE — Telephone Encounter (Signed)
Pressure sore of upper back - [name], MD at 09/11/2014 5:08 PM     Status: Written Related Problem: Pressure sore of upper back   Expand All Collapse All   New. Suspect this is due to pt's limited mobility and regular positioning against her R shoulder blade. Pt has no feeling in the area which further complicates the area b/c she cannot feel if the area is worsening or uncomfortable. Pt to call back w/ name of home health agency so we can alert them to the problem and they can help her work on positioning. Reviewed supportive care and red flags that should prompt return. Pt expressed understanding and is in agreement w/ plan.        Spoke with patient and she was unsure of the name of the facility, but gave the following number:  [phone].

## 2014-09-12 NOTE — Telephone Encounter (Signed)
External referral placed for nursing services with [facility].  Information routed to [employer].

## 2014-09-12 NOTE — Telephone Encounter (Signed)
Order faxed.

## 2014-09-12 NOTE — Telephone Encounter (Signed)
Called number given. The name of the facility is [facility].  Spoke with staff there who verified that they are caring for patient.  Spoke with staff member and made her aware of stage 1 pressure sore on patient's upper back.  Staff stated that an order for nursing was needed and asked that a referral be placed.  She also asked that in the order we include nursing working with patient on positioning to prevent further breakdown.  Order is to be faxed to [phone].

## 2014-09-13 DIAGNOSIS — Z4789 Encounter for other orthopedic aftercare: Secondary | ICD-10-CM | POA: Diagnosis not present

## 2014-09-13 DIAGNOSIS — G629 Polyneuropathy, unspecified: Secondary | ICD-10-CM | POA: Diagnosis not present

## 2014-09-13 DIAGNOSIS — M1990 Unspecified osteoarthritis, unspecified site: Secondary | ICD-10-CM | POA: Diagnosis not present

## 2014-09-13 DIAGNOSIS — F419 Anxiety disorder, unspecified: Secondary | ICD-10-CM | POA: Diagnosis not present

## 2014-09-13 DIAGNOSIS — M40204 Unspecified kyphosis, thoracic region: Secondary | ICD-10-CM | POA: Diagnosis not present

## 2014-09-13 DIAGNOSIS — G8929 Other chronic pain: Secondary | ICD-10-CM | POA: Diagnosis not present

## 2014-09-14 ENCOUNTER — Ambulatory Visit: Payer: Medicare Other | Admitting: Family Medicine

## 2014-09-14 DIAGNOSIS — Z4889 Encounter for other specified surgical aftercare: Secondary | ICD-10-CM | POA: Diagnosis not present

## 2014-09-20 ENCOUNTER — Telehealth: Payer: Self-pay | Admitting: Family Medicine

## 2014-09-20 DIAGNOSIS — F419 Anxiety disorder, unspecified: Secondary | ICD-10-CM | POA: Diagnosis not present

## 2014-09-20 DIAGNOSIS — G8929 Other chronic pain: Secondary | ICD-10-CM | POA: Diagnosis not present

## 2014-09-20 DIAGNOSIS — Z4789 Encounter for other orthopedic aftercare: Secondary | ICD-10-CM | POA: Diagnosis not present

## 2014-09-20 DIAGNOSIS — L89119 Pressure ulcer of right upper back, unspecified stage: Secondary | ICD-10-CM | POA: Diagnosis not present

## 2014-09-20 DIAGNOSIS — Z85820 Personal history of malignant melanoma of skin: Secondary | ICD-10-CM | POA: Diagnosis not present

## 2014-09-20 DIAGNOSIS — B078 Other viral warts: Secondary | ICD-10-CM | POA: Diagnosis not present

## 2014-09-20 DIAGNOSIS — M1990 Unspecified osteoarthritis, unspecified site: Secondary | ICD-10-CM | POA: Diagnosis not present

## 2014-09-20 DIAGNOSIS — G629 Polyneuropathy, unspecified: Secondary | ICD-10-CM | POA: Diagnosis not present

## 2014-09-20 DIAGNOSIS — M40204 Unspecified kyphosis, thoracic region: Secondary | ICD-10-CM | POA: Diagnosis not present

## 2014-09-20 NOTE — Telephone Encounter (Signed)
Pt has concerns about her blood pressure. States since home health nurses have been coming out, she's noticed that her blood pressure have been running low (90s/60s; today it was 96/64).  She shared that while in the hospital she was started on Carvedilol 1 tablet twice a day for her increased heart rate.  Pt states that instead of helping her heart rate, which is consistently staying in the 90's, it is lowering her blood pressure.   She also complains of feeling tired and wants to know if there is anything she can do.  Spoke with Dr. [name].  Her recommendation was for patient to take Carvedilol 1/2 tablet twice a day for 1-2 days. If blood pressure remain below 100, hold medication.  Called and left a detailed message on patient's phone stating the same.  Pt was encouraged to call back with questions or concerns.

## 2014-09-20 NOTE — Telephone Encounter (Signed)
Caller name:Mungin Adrine Relation to UY:QIHK Call back number:[phone] Pharmacy:  Reason for call: pt would like for you to call her back states she has questions about her medication that is taking.

## 2014-09-22 ENCOUNTER — Telehealth: Payer: Self-pay | Admitting: Family Medicine

## 2014-09-22 NOTE — Telephone Encounter (Signed)
Notified patient, who stated that she was familiar with Miralax and will try this.  eal

## 2014-09-22 NOTE — Telephone Encounter (Signed)
Miralax

## 2014-09-22 NOTE — Telephone Encounter (Signed)
Caller name: Hoorain Relation to pt: self Call back number: [phone] Pharmacy:  Reason for call:   Patient states that her surgeon put her on tramadol and it is giving her constipation and wants to know what she should take for this.

## 2014-09-27 DIAGNOSIS — Z4789 Encounter for other orthopedic aftercare: Secondary | ICD-10-CM | POA: Diagnosis not present

## 2014-09-27 DIAGNOSIS — G8929 Other chronic pain: Secondary | ICD-10-CM | POA: Diagnosis not present

## 2014-09-27 DIAGNOSIS — M40204 Unspecified kyphosis, thoracic region: Secondary | ICD-10-CM | POA: Diagnosis not present

## 2014-09-27 DIAGNOSIS — M1990 Unspecified osteoarthritis, unspecified site: Secondary | ICD-10-CM | POA: Diagnosis not present

## 2014-09-27 DIAGNOSIS — F419 Anxiety disorder, unspecified: Secondary | ICD-10-CM | POA: Diagnosis not present

## 2014-09-27 DIAGNOSIS — G629 Polyneuropathy, unspecified: Secondary | ICD-10-CM | POA: Diagnosis not present

## 2014-09-27 NOTE — Telephone Encounter (Signed)
Pt notified and she expressed an understanding.

## 2014-09-27 NOTE — Telephone Encounter (Signed)
BP and HR are ok on 1/2 tab twice daily.  No changes at this time

## 2014-09-27 NOTE — Telephone Encounter (Signed)
Patient calling back stating that her bp is 140/80. Pulse 101. Patient states that she is taking 1/2 a carvedilol twice daily.  Best # [phone]

## 2014-09-29 DIAGNOSIS — M40204 Unspecified kyphosis, thoracic region: Secondary | ICD-10-CM | POA: Diagnosis not present

## 2014-09-29 DIAGNOSIS — F419 Anxiety disorder, unspecified: Secondary | ICD-10-CM | POA: Diagnosis not present

## 2014-09-29 DIAGNOSIS — Z4789 Encounter for other orthopedic aftercare: Secondary | ICD-10-CM | POA: Diagnosis not present

## 2014-09-29 DIAGNOSIS — G629 Polyneuropathy, unspecified: Secondary | ICD-10-CM | POA: Diagnosis not present

## 2014-09-29 DIAGNOSIS — G8929 Other chronic pain: Secondary | ICD-10-CM | POA: Diagnosis not present

## 2014-09-29 DIAGNOSIS — M1990 Unspecified osteoarthritis, unspecified site: Secondary | ICD-10-CM | POA: Diagnosis not present

## 2014-10-03 DIAGNOSIS — R339 Retention of urine, unspecified: Secondary | ICD-10-CM | POA: Diagnosis not present

## 2014-10-03 DIAGNOSIS — N9489 Other specified conditions associated with female genital organs and menstrual cycle: Secondary | ICD-10-CM | POA: Diagnosis not present

## 2014-10-03 DIAGNOSIS — R399 Unspecified symptoms and signs involving the genitourinary system: Secondary | ICD-10-CM | POA: Diagnosis not present

## 2014-10-03 DIAGNOSIS — N301 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: Secondary | ICD-10-CM | POA: Diagnosis not present

## 2014-10-03 DIAGNOSIS — Z09 Encounter for follow-up examination after completed treatment for conditions other than malignant neoplasm: Secondary | ICD-10-CM | POA: Diagnosis not present

## 2014-10-04 ENCOUNTER — Ambulatory Visit: Payer: Medicare Other | Admitting: Family Medicine

## 2014-10-16 ENCOUNTER — Ambulatory Visit: Payer: Medicare Other | Admitting: Family Medicine

## 2014-10-19 ENCOUNTER — Encounter: Payer: Self-pay | Admitting: Family Medicine

## 2014-10-19 ENCOUNTER — Telehealth: Payer: Self-pay | Admitting: General Practice

## 2014-10-19 ENCOUNTER — Ambulatory Visit (INDEPENDENT_AMBULATORY_CARE_PROVIDER_SITE_OTHER): Payer: Medicare Other | Admitting: Family Medicine

## 2014-10-19 VITALS — BP 118/80 | HR 119 | Temp 97.9°F | Resp 16 | Wt 118.4 lb

## 2014-10-19 DIAGNOSIS — L89101 Pressure ulcer of unspecified part of back, stage 1: Secondary | ICD-10-CM

## 2014-10-19 DIAGNOSIS — E46 Unspecified protein-calorie malnutrition: Secondary | ICD-10-CM | POA: Diagnosis not present

## 2014-10-19 DIAGNOSIS — E43 Unspecified severe protein-calorie malnutrition: Secondary | ICD-10-CM | POA: Insufficient documentation

## 2014-10-19 DIAGNOSIS — F419 Anxiety disorder, unspecified: Secondary | ICD-10-CM | POA: Diagnosis not present

## 2014-10-19 DIAGNOSIS — Z981 Arthrodesis status: Secondary | ICD-10-CM

## 2014-10-19 NOTE — Assessment & Plan Note (Signed)
Chronic problem.  Stopped Zoloft due to side effects.  Pt reports doing better since she started phone counseling.  Remains very anxious- now feels like she made the wrong decision to have surgery.  Tried to reassure her that the post-op period can be very difficult.  Will follow.

## 2014-10-19 NOTE — Progress Notes (Signed)
Pre visit review using our clinic review tool, if applicable. No additional management support is needed unless otherwise documented below in the visit note. 

## 2014-10-19 NOTE — Assessment & Plan Note (Signed)
Pt continues to lose weight.  Suspect this is due in part to her anxiety.  Stressed need to eat regularly and include protein shakes daily.  Will follow closely.

## 2014-10-19 NOTE — Telephone Encounter (Signed)
Called pt to find out if she could possibly be here at 1pm for her appointment. We had a  Cancellation and pt usually requires 30 minutes for appointments.

## 2014-10-19 NOTE — Telephone Encounter (Signed)
Pt came in for 1pm appt.

## 2014-10-19 NOTE — Progress Notes (Signed)
   Subjective:    Patient ID: [name], female    DOB: 1945/07/22, 69 y.o.   MRN: [id number]  HPI Pressure sore on back- pt had stage 1 pressure sore on R side of thoracic spine  Anxiety- chronic problem, has been talking w/ counselor by phone.  Stopped the Zoloft- 'it made me more agitated'.  Pt is interested in face to face counseling once she is able to drive again.  Pt reports fatigue is better, energy is improving.  S/p neurosurgery- pt reports ongoing pain, knot on back of R neck.  'they won't let me come in'.  Pt has next f/u scheduled 5/14.  Pt reports she is 'begging' to come in and they keep telling her no.  Pt is seeing Dr Atilano Ina.  Weight loss- pt has lost an additional 5 lbs.  Pt reports she was 130lbs prior to surgery.  Pt is still drinking protein shakes.   Review of Systems For ROS see HPI     Objective:   Physical Exam  Constitutional: She is oriented to person, place, and time. No distress.  Thin, almost cachetic appearing  HENT:  Head: Normocephalic and atraumatic.  Neck:  Pt w/ multiple bony/hardware protrusions on back/neck Pt's L strap and trapezius muscles more prominent than on R  Musculoskeletal: She exhibits no edema.  Neurological: She is alert and oriented to person, place, and time.  Skin: Skin is warm and dry.  Pt w/ scab over center of scarred stage 1 pressure sore just R of thoracic spine  Psychiatric:  Anxious, tearful  Vitals reviewed.         Assessment & Plan:

## 2014-10-19 NOTE — Assessment & Plan Note (Signed)
Ongoing issue.  Pt now w/ central scab but surrounding area has healed w/ scarring present.  Not TTP.  No drainage or signs of infxn.  Encouraged pt to keep pressure off of this area.

## 2014-10-19 NOTE — Assessment & Plan Note (Signed)
[name] has multiple bony/hardware protrusions.  Having considerable pain.  Reports she has called surgeon multiple times and has not been able to get an appt.  She goes so far as to say she was begging.  We called [name] today and [name] now has appt for next week.  [name] appreciative.

## 2014-10-19 NOTE — Patient Instructions (Addendum)
Follow up in 1 month to recheck weight loss You have an appt w/ [name] on 4/21 at 8:30am Make sure you are eating regularly- high protein, high fat diet- including protein shakes Continue to keep pressure off the R side of your back to allow the pressure sore to heal Continue your counseling- this is VERY important for your well being Call with any questions or concerns Hang in there!!

## 2014-10-26 DIAGNOSIS — Z981 Arthrodesis status: Secondary | ICD-10-CM | POA: Diagnosis not present

## 2014-10-26 DIAGNOSIS — M419 Scoliosis, unspecified: Secondary | ICD-10-CM | POA: Diagnosis not present

## 2014-10-26 DIAGNOSIS — Z9889 Other specified postprocedural states: Secondary | ICD-10-CM | POA: Diagnosis not present

## 2014-10-26 DIAGNOSIS — Z09 Encounter for follow-up examination after completed treatment for conditions other than malignant neoplasm: Secondary | ICD-10-CM | POA: Diagnosis not present

## 2014-10-26 DIAGNOSIS — Z4789 Encounter for other orthopedic aftercare: Secondary | ICD-10-CM | POA: Diagnosis not present

## 2014-10-26 DIAGNOSIS — Z8739 Personal history of other diseases of the musculoskeletal system and connective tissue: Secondary | ICD-10-CM | POA: Diagnosis not present

## 2014-10-29 ENCOUNTER — Other Ambulatory Visit: Payer: Self-pay | Admitting: Internal Medicine

## 2014-11-02 ENCOUNTER — Telehealth: Payer: Self-pay | Admitting: Family Medicine

## 2014-11-02 NOTE — Telephone Encounter (Signed)
Relation to pt: self  Call back number:[phone]   Reason for call:  Pt in need of clinical advice regarding carvedilol (COREG). States she does not know if she should be taking medication.

## 2014-11-02 NOTE — Telephone Encounter (Signed)
Spoke with pt and advised that she needed to remain on 1/2 tablet twice daily. Chart updated to reflect

## 2014-11-02 NOTE — Telephone Encounter (Signed)
Yes.  Continue medication.

## 2014-11-03 ENCOUNTER — Encounter: Payer: Self-pay | Admitting: Cardiology

## 2014-11-03 DIAGNOSIS — F410 Panic disorder [episodic paroxysmal anxiety] without agoraphobia: Secondary | ICD-10-CM | POA: Diagnosis not present

## 2014-11-03 DIAGNOSIS — I480 Paroxysmal atrial fibrillation: Secondary | ICD-10-CM | POA: Diagnosis not present

## 2014-11-03 DIAGNOSIS — R600 Localized edema: Secondary | ICD-10-CM | POA: Diagnosis not present

## 2014-11-03 DIAGNOSIS — R002 Palpitations: Secondary | ICD-10-CM | POA: Diagnosis not present

## 2014-11-03 DIAGNOSIS — I348 Other nonrheumatic mitral valve disorders: Secondary | ICD-10-CM | POA: Diagnosis not present

## 2014-11-03 NOTE — Progress Notes (Signed)
Patient ID: [name], female   DOB: [birth year], 69 y.o.   MRN: [id number]   [name], [name]  Date of visit:  11/03/2014 DOB:  [birth year]    Age:  69 yrs. Medical record number:  [id number]     Account number:  [id number] Primary Care Provider: [name] ____________________________ CURRENT DIAGNOSES  1. Abnormal electrocardiogram [ECG] [EKG]  2. Panic disorder [episodic paroxysmal anxiety] without agoraphobia  3. Other nonrheumatic mitral valve disorders  4. Palpitations  5. Paroxysmal atrial fibrillation  6. Edema  7. Chronic venous insufficiency ____________________________ ALLERGIES  Cipro, Intolerance-unknown  Diltiazem, Intolerance-unknown  Duloxetine, Intolerance-unknown  Erythromycin Base, Intolerance-unknown  FOSAMAX, Intolerance-unknown  IVP Dye, Iodine Containing, Hives and/or rash  Latex, Intolerance-unknown  Metoprolol, Lethargy  Penicillins, Intolerance-unknown  Sulfa (Sulfonamides), Intolerance-unknown ____________________________ MEDICATIONS  1. Vitamin D 1,000 unit Tablet, 1 p.o. daily  2. Prelief 65 mg tablet, PRN  3. acetaminophen 500 mg capsule, PRN  4. Tums 200 mg calcium (500 mg) tablet, chewable, PRN  5. loratadine 10 mg tablet, 1 p.o. daily  6. valacyclovir 1 gram tablet, 1 p.o. daily  7. clonazepam 0.5 mg tablet, PRN  8. metoprolol tartrate 25 mg tablet, PRN  9. tramadol 50 mg tablet, PRN  10. Probiotic 10 billion cell capsule, 1 p.o. daily  11. furosemide 40 mg tablet, PRN  12. carvedilol 3.125 mg tablet, 1/2 tab b.i.d.  13. pantoprazole DR 40 mg granules delayed-release for susp in packet, 1 p.o. daily ____________________________ CHIEF COMPLAINTS  Followup of palpitations, recent edema ____________________________ HISTORY OF PRESENT ILLNESS Patient seen for cardiac followup. She had extensive spinal surgery at [facility] to correct for scoliosis last fall. 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 She was hospitalized in February of this year with significant edema and swelling. She had negative venous Dopplers and had an echocardiogram that showed normal cardiac function. She has been slowly recovering from her surgery but has been depressed and states that she cries every day. She hasn't really had any shortness of breath or chest pain. During that hospitalization she was placed on carvedilol for intermittent tachycardia. She remains anxious and stressed. In the past she has had some paroxysmal atrial arrhythmias but none recently and did not have any atrial fibrillation when she was in the hospital this time.  1. Recent extensive spinal surgery 2. Edema due to chronic venous stasis as well as volume overload from transfusions during the surgery 3. History of atrial arrhythmias 4. History of panic disorder  Recommendations:  Extensive hospital records were reviewed. Recent echocardiogram showed good LV function and the edema likely was noncardiac. She continues to have evidence of chronic venous stasis. We discussed this and she may use p.r.n. metoprolol if needed. Followup in one year. ____________________________ PAST HISTORY  Past Medical Illnesses:  depression, GERD, lumbar disc disease, osteoporosis, nephrolithiasis, history of pneumonia, panic attacks, anxiety, cervical disc disease, history of melanoma, interstitial cystitis;  Cardiovascular Illnesses:  history of mitral valve prolapse, atrial fibrillation;  Surgical Procedures:  hysterectomy, breast biopsy, melanoma rt foot, ant cervical neck fusion, bunion surgery, bony cyst forehead, posterior cervical fusion 10/09, bladder surgery, right rotator cuff surgery August 2012, extensive spal and nek surgery fall 2015 [facility];  NYHA Classification:  I;  Canadian Angina Classification:  Class 0: Asymptomatic;  Cardiology Procedures-Invasive:  no previous  interventional or invasive cardiology procedures;  Cardiology Procedures-Noninvasive:  treadmill cardiolite  July 2008, event monitor February 2012, echocardiogram March 2012, treadmill Myoview March 2014, echocardiogram February 2016;  LVEF of 55% documented via echocardiogram on 08/08/2014,   ____________________________ CARDIO-PULMONARY TEST DATES EKG Date:  02/13/2014;  Holter/Event Monitor Date: 08/13/2010;  Nuclear Study Date:  09/09/2012;  Echocardiography Date: 08/08/2014;  Chest Xray Date: 08/07/2014;  CT Scan Date:  06/13/2004   ____________________________ FAMILY HISTORY Brother -- Brother dead, Coronary Artery Disease, Diabetes mellitus, Coronary artery bypass grafting Brother -- Brother alive with problem Brother -- Brother alive and well Father -- Father dead, Diabetes mellitus, Deceased Mother -- Mother dead, Cancer, Deceased Sister -- Sister alive and well Sister -- Sister alive with problem, CVA Sister -- Sister alive and well Sister -- Sister alive and well ____________________________ SOCIAL HISTORY Alcohol Use:  does not use alcohol;  Smoking:  used to smoke but quit 1984;  Diet:  regular diet;  Lifestyle:  divorced and 1 son;  Exercise:  no regular exercise;  Residence:  lives alone;   ____________________________ REVIEW OF SYSTEMS General:  fatigue, weight loss of approximately 10 lbs Eyes: denies diplopia, history of glaucoma or visual problems. Ears, Nose, Throat, Mouth:  tinnitus Respiratory: denies dyspnea, cough, wheezing or hemoptysis. Cardiovascular:  please review HPI Genitourinary-Female: nocturia, interstitial cystitis Musculoskeletal:  chronic low back pain Neurological:  headaches, dizziness, peripheral neuropathy, waekness of legs Psychiatric:  panic disorder  ____________________________ PHYSICAL EXAMINATION VITAL SIGNS  Blood Pressure:  114/80 Sitting, Left arm, regular cuff  , 104/80 Standing, Left arm and regular cuff   Pulse:  80/min. Weight:  121.00  lbs. Height:  68"BMI: 18  Constitutional:  pleasant thin white female, in no acute distress, anxious Skin:  warm and dry to touch, no apparent skin lesions, or masses noted. Head:  normocephalic, normal hair pattern, no masses or tenderness ENT:  ears, nose and throat reveal no gross abnormalities.  Dentition good. Neck:  supple, no masses, thyromegaly, JVD. Carotid pulses are full and equal bilaterally without bruits. Chest:  normal symmetry, clear to auscultation. Cardiac:  regular rhythm, normal S1 and S2, No S3 or S4, no murmurs, gallops or rubs detected. Peripheral Pulses:  the femoral,dorsalis pedis, and posterior tibial pulses are full and equal bilaterally with no bruits auscultated. Extremities & Back:  spine and neck straight with extensive scar entire length,, bilateral venous insufficiency changes present Neurological:  no gross motor or sensory deficits noted, affect appropriate, oriented x3. ____________________________ MOST RECENT LIPID PANEL 08/13/10  CHOL TOTL 202 mg/dl, LDL 98 NM, HDL 90 mg/dl, TRIGLYCER 68 mg/dl and CHOL/HDL 2.2 (Calc) ____________________________ IMPRESSIONS/PLAN   (Enter Doctor Dictated Impressions Here) ____________________________ TODAYS ORDERS  1. Return Visit: 1 year  2. 12 Lead EKG: 1 year                       ____________________________ Cardiology Physician:  Kerry Hough MD [facility] [place]

## 2014-11-06 ENCOUNTER — Ambulatory Visit: Payer: Medicare Other | Admitting: Family Medicine

## 2014-11-07 DIAGNOSIS — Z9889 Other specified postprocedural states: Secondary | ICD-10-CM | POA: Diagnosis not present

## 2014-11-08 ENCOUNTER — Ambulatory Visit: Payer: Medicare Other | Admitting: Family Medicine

## 2014-11-10 DIAGNOSIS — Z9889 Other specified postprocedural states: Secondary | ICD-10-CM | POA: Diagnosis not present

## 2014-11-15 DIAGNOSIS — M40204 Unspecified kyphosis, thoracic region: Secondary | ICD-10-CM | POA: Diagnosis not present

## 2014-11-15 DIAGNOSIS — M8580 Other specified disorders of bone density and structure, unspecified site: Secondary | ICD-10-CM | POA: Diagnosis not present

## 2014-11-15 DIAGNOSIS — M549 Dorsalgia, unspecified: Secondary | ICD-10-CM | POA: Diagnosis not present

## 2014-11-15 DIAGNOSIS — M4056 Lordosis, unspecified, lumbar region: Secondary | ICD-10-CM | POA: Diagnosis not present

## 2014-11-15 DIAGNOSIS — Z981 Arthrodesis status: Secondary | ICD-10-CM | POA: Diagnosis not present

## 2014-11-15 DIAGNOSIS — R197 Diarrhea, unspecified: Secondary | ICD-10-CM | POA: Diagnosis not present

## 2014-11-15 DIAGNOSIS — Z9889 Other specified postprocedural states: Secondary | ICD-10-CM | POA: Diagnosis not present

## 2014-11-16 DIAGNOSIS — Z9889 Other specified postprocedural states: Secondary | ICD-10-CM | POA: Diagnosis not present

## 2014-11-17 ENCOUNTER — Other Ambulatory Visit: Payer: Self-pay | Admitting: General Practice

## 2014-11-17 ENCOUNTER — Ambulatory Visit (INDEPENDENT_AMBULATORY_CARE_PROVIDER_SITE_OTHER): Payer: Medicare Other | Admitting: Family Medicine

## 2014-11-17 ENCOUNTER — Encounter: Payer: Self-pay | Admitting: Family Medicine

## 2014-11-17 VITALS — BP 110/80 | HR 102 | Temp 97.0°F | Resp 16 | Wt 115.4 lb

## 2014-11-17 DIAGNOSIS — R140 Abdominal distension (gaseous): Secondary | ICD-10-CM

## 2014-11-17 DIAGNOSIS — Z981 Arthrodesis status: Secondary | ICD-10-CM

## 2014-11-17 DIAGNOSIS — F419 Anxiety disorder, unspecified: Secondary | ICD-10-CM

## 2014-11-17 DIAGNOSIS — E46 Unspecified protein-calorie malnutrition: Secondary | ICD-10-CM | POA: Diagnosis not present

## 2014-11-17 MED ORDER — CLONAZEPAM 0.5 MG PO TABS: 0.2500 mg | ORAL_TABLET | Freq: Two times a day (BID) | ORAL | Status: DC | PRN

## 2014-11-17 NOTE — Progress Notes (Signed)
   Subjective:    Patient ID: [name], female    DOB: 01-11-1946, 69 y.o.   MRN: [id number]  HPI S/p cervical fusion- pt has seen surgeon x2 for 'knots' in back and neck.  Pt is now in PT.  Pt continues to have severe pain at site of lumps.  Was seen yesterday and had xrays done.  Very anxious about results.  GI issue- pt saw Dr Earlean Shawl, now on Align and Entero-gram packs.  GI re-sent C Diff cx.  IBS was also on differential.  Weight loss- pt reports that she was 119 lbs at Dr [facility] office earlier this week.  Is very upset today that scale says 115 lbs.  Pt reports she is eating regularly- 2 eggs, toast, sandwich, and Reese's cup so far today.   Review of Systems For ROS see HPI     Objective:   Physical Exam  Constitutional: She is oriented to person, place, and time.  Thin, frail appearing  HENT:  Head: Normocephalic and atraumatic.  Eyes: Conjunctivae and EOM are normal. Pupils are equal, round, and reactive to light.  Cardiovascular: Normal rate, regular rhythm, normal heart sounds and intact distal pulses.   Pulmonary/Chest: Effort normal and breath sounds normal. No respiratory distress. She has no wheezes. She has no rales.  Abdominal: Soft. Bowel sounds are normal. She exhibits no distension. There is no tenderness. There is no rebound.  Musculoskeletal: She exhibits no edema.  Lymphadenopathy:    She has no cervical adenopathy.  Neurological: She is alert and oriented to person, place, and time.  Skin: Skin is warm and dry.  Psychiatric:  Very anxious, intermittently tearful  Vitals reviewed.         Assessment & Plan:

## 2014-11-17 NOTE — Patient Instructions (Signed)
Schedule an appt w/ counselor- Marya Amsler [phone] Follow up w/ Dr Earlean Shawl as recommended Continue to eat regularly- high protein, supplemental shakes Continue the Clonazepam as needed for anxiety Call with any questions or concerns Hang in there!!

## 2014-11-19 NOTE — Assessment & Plan Note (Signed)
Pt continues to struggle w/ her recent surgery and sequela.  Has xray results pending w/ neuro surgery to determine what the bone like protrusions in her back are- bone vs hardware.  Pt is appropriately anxious about this as she doesn't feel she can undergo another surgery.  Will continue to follow and offer my support.

## 2014-11-19 NOTE — Assessment & Plan Note (Signed)
New to provider.  Not apparent on exam today but pt reports this is a problem.  Has seen GI and repeat C Diff is pending.  Pt now on probiotics daily.  Suspect she has component of IBS.  Encouraged her to f/u w/ GI as scheduled.  Will follow along.

## 2014-11-19 NOTE — Assessment & Plan Note (Signed)
Unchanged.  Still a large problem for pt.  Refill on Clonazepam provided.  Pt has stopped or 'had a reaction to' every SSRI we have tried to control her sxs.  Strongly encouraged her to start counseling for her ongoing issues as I feel her anxiety is now impacting her healing and overall well being.  Pt is in agreement.  Will follow.

## 2014-11-19 NOTE — Assessment & Plan Note (Signed)
Pt has lost more weight despite 'regular' eating.  Again stressed need for at least 3 meals daily w/ snacks.  Reviewed need for increased protein to assist w/ healing.  Will continue to follow.

## 2014-11-20 ENCOUNTER — Ambulatory Visit: Payer: Medicare Other | Admitting: Family Medicine

## 2014-11-20 DIAGNOSIS — Z9889 Other specified postprocedural states: Secondary | ICD-10-CM | POA: Diagnosis not present

## 2014-11-22 DIAGNOSIS — R197 Diarrhea, unspecified: Secondary | ICD-10-CM | POA: Diagnosis not present

## 2014-11-27 DIAGNOSIS — Z9889 Other specified postprocedural states: Secondary | ICD-10-CM | POA: Diagnosis not present

## 2014-11-30 DIAGNOSIS — Z981 Arthrodesis status: Secondary | ICD-10-CM | POA: Diagnosis not present

## 2014-11-30 DIAGNOSIS — G894 Chronic pain syndrome: Secondary | ICD-10-CM | POA: Diagnosis not present

## 2014-11-30 DIAGNOSIS — M6248 Contracture of muscle, other site: Secondary | ICD-10-CM | POA: Diagnosis not present

## 2014-11-30 DIAGNOSIS — M542 Cervicalgia: Secondary | ICD-10-CM | POA: Diagnosis not present

## 2014-12-01 DIAGNOSIS — A047 Enterocolitis due to Clostridium difficile: Secondary | ICD-10-CM | POA: Diagnosis not present

## 2014-12-03 ENCOUNTER — Other Ambulatory Visit: Payer: Self-pay | Admitting: Family Medicine

## 2014-12-05 NOTE — Telephone Encounter (Signed)
Med filled.  

## 2014-12-06 DIAGNOSIS — T2123XS Burn of second degree of upper back, sequela: Secondary | ICD-10-CM | POA: Diagnosis not present

## 2014-12-06 DIAGNOSIS — Z85820 Personal history of malignant melanoma of skin: Secondary | ICD-10-CM | POA: Diagnosis not present

## 2014-12-07 ENCOUNTER — Ambulatory Visit: Payer: Medicare Other | Admitting: Licensed Clinical Social Worker

## 2014-12-07 DIAGNOSIS — Z9889 Other specified postprocedural states: Secondary | ICD-10-CM | POA: Diagnosis not present

## 2014-12-07 DIAGNOSIS — M40294 Other kyphosis, thoracic region: Secondary | ICD-10-CM | POA: Diagnosis not present

## 2014-12-07 DIAGNOSIS — T84498A Other mechanical complication of other internal orthopedic devices, implants and grafts, initial encounter: Secondary | ICD-10-CM | POA: Diagnosis not present

## 2014-12-13 ENCOUNTER — Other Ambulatory Visit: Payer: Self-pay | Admitting: Internal Medicine

## 2014-12-13 DIAGNOSIS — Z9889 Other specified postprocedural states: Secondary | ICD-10-CM | POA: Diagnosis not present

## 2014-12-18 ENCOUNTER — Other Ambulatory Visit: Payer: Self-pay | Admitting: General Practice

## 2014-12-18 DIAGNOSIS — Z9889 Other specified postprocedural states: Secondary | ICD-10-CM | POA: Diagnosis not present

## 2014-12-18 MED ORDER — CARVEDILOL 3.125 MG PO TABS: ORAL_TABLET | ORAL | Status: DC

## 2014-12-22 DIAGNOSIS — M542 Cervicalgia: Secondary | ICD-10-CM | POA: Diagnosis not present

## 2014-12-25 DIAGNOSIS — Z9889 Other specified postprocedural states: Secondary | ICD-10-CM | POA: Diagnosis not present

## 2014-12-28 DIAGNOSIS — Z9889 Other specified postprocedural states: Secondary | ICD-10-CM | POA: Diagnosis not present

## 2015-01-01 DIAGNOSIS — R143 Flatulence: Secondary | ICD-10-CM | POA: Diagnosis not present

## 2015-01-01 DIAGNOSIS — R634 Abnormal weight loss: Secondary | ICD-10-CM | POA: Diagnosis not present

## 2015-01-04 ENCOUNTER — Encounter: Payer: Self-pay | Admitting: Family Medicine

## 2015-01-04 ENCOUNTER — Ambulatory Visit (INDEPENDENT_AMBULATORY_CARE_PROVIDER_SITE_OTHER): Payer: Medicare Other | Admitting: Family Medicine

## 2015-01-04 VITALS — BP 130/82 | HR 98 | Temp 97.9°F | Resp 16 | Wt 119.4 lb

## 2015-01-04 DIAGNOSIS — Z9889 Other specified postprocedural states: Secondary | ICD-10-CM | POA: Diagnosis not present

## 2015-01-04 DIAGNOSIS — R300 Dysuria: Secondary | ICD-10-CM | POA: Diagnosis not present

## 2015-01-04 DIAGNOSIS — R319 Hematuria, unspecified: Secondary | ICD-10-CM | POA: Diagnosis not present

## 2015-01-04 DIAGNOSIS — N390 Urinary tract infection, site not specified: Secondary | ICD-10-CM | POA: Diagnosis not present

## 2015-01-04 DIAGNOSIS — R82998 Other abnormal findings in urine: Secondary | ICD-10-CM

## 2015-01-04 DIAGNOSIS — A047 Enterocolitis due to Clostridium difficile: Secondary | ICD-10-CM

## 2015-01-04 DIAGNOSIS — A0472 Enterocolitis due to Clostridium difficile, not specified as recurrent: Secondary | ICD-10-CM | POA: Insufficient documentation

## 2015-01-04 LAB — POCT URINALYSIS DIPSTICK
Bilirubin, UA: NEGATIVE
Glucose, UA: NEGATIVE
Ketones, UA: NEGATIVE
Nitrite, UA: NEGATIVE
Protein, UA: NEGATIVE
Spec Grav, UA: >=1.03
Urobilinogen, UA: NEGATIVE
pH, UA: 6

## 2015-01-04 NOTE — Assessment & Plan Note (Signed)
New.  Based on pt's sxs, it is difficult to determine whether sxs are due to IC flare or true infxn.  Given that sxs are currently mild, will await cx results prior to starting abx given her recent C diff.  Pt expressed understanding and is in agreement w/ plan.

## 2015-01-04 NOTE — Assessment & Plan Note (Signed)
New.  Pt has been following w/ Dr Earlean Shawl due to her ongoing diarrhea.  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  Encouraged pt to f/u w/ GI as scheduled to determine if there has been any improvement and to have her questions answered.  Pt expressed understanding and is in agreement w/ plan.

## 2015-01-04 NOTE — Patient Instructions (Signed)
Follow up as aneeded Increase your water intake We'll notify you of your culture results and determine if antibiotics are required Make sure to address your concerns w/ Dr Earlean Shawl so he can run any tests that are needed Call with any questions or concerns Happy 4th of July!!!

## 2015-01-04 NOTE — Progress Notes (Signed)
Pre visit review using our clinic review tool, if applicable. No additional management support is needed unless otherwise documented below in the visit note. 

## 2015-01-04 NOTE — Progress Notes (Signed)
   Subjective:    Patient ID: [name], female    DOB: [birth year], 69 y.o.   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  Mild increase in urinary frequency.  'i've got bubbles in my urine'.  No N/V, fevers, chills.  C diff- ongoing issue for pt.  Following w/ Dr Earlean Shawl.  Just completed another round of abx.  GI is now concerned about pancreatic insufficiency.  Currently on samples of Zenpep.  Pt to f/u w/ GI in 1 month.   Review of Systems For ROS see HPI     Objective:   Physical Exam  Constitutional: She is oriented to person, place, and time. She appears well-developed. No distress.  Very thin  HENT:  Head: Normocephalic and atraumatic.  Cardiovascular: Normal rate, regular rhythm and intact distal pulses.   Pulmonary/Chest: Effort normal and breath sounds normal. No respiratory distress. She has no wheezes. She has no rales.  Abdominal: Soft. Bowel sounds are normal. She exhibits no distension. There is no tenderness. There is no rebound and no guarding.  Neurological: She is alert and oriented to person, place, and time.  Skin: Skin is warm and dry.  Psychiatric:  anxious  Vitals reviewed.         Assessment & Plan:

## 2015-01-05 DIAGNOSIS — D2271 Melanocytic nevi of right lower limb, including hip: Secondary | ICD-10-CM | POA: Diagnosis not present

## 2015-01-05 DIAGNOSIS — D2272 Melanocytic nevi of left lower limb, including hip: Secondary | ICD-10-CM | POA: Diagnosis not present

## 2015-01-05 DIAGNOSIS — D225 Melanocytic nevi of trunk: Secondary | ICD-10-CM | POA: Diagnosis not present

## 2015-01-05 DIAGNOSIS — L821 Other seborrheic keratosis: Secondary | ICD-10-CM | POA: Diagnosis not present

## 2015-01-05 DIAGNOSIS — D2261 Melanocytic nevi of right upper limb, including shoulder: Secondary | ICD-10-CM | POA: Diagnosis not present

## 2015-01-05 DIAGNOSIS — Z85820 Personal history of malignant melanoma of skin: Secondary | ICD-10-CM | POA: Diagnosis not present

## 2015-01-05 DIAGNOSIS — L814 Other melanin hyperpigmentation: Secondary | ICD-10-CM | POA: Diagnosis not present

## 2015-01-05 DIAGNOSIS — D1801 Hemangioma of skin and subcutaneous tissue: Secondary | ICD-10-CM | POA: Diagnosis not present

## 2015-01-05 DIAGNOSIS — L570 Actinic keratosis: Secondary | ICD-10-CM | POA: Diagnosis not present

## 2015-01-05 DIAGNOSIS — L905 Scar conditions and fibrosis of skin: Secondary | ICD-10-CM | POA: Diagnosis not present

## 2015-01-08 LAB — URINE CULTURE: Colony Count: 35000

## 2015-01-10 ENCOUNTER — Telehealth: Payer: Self-pay | Admitting: Family Medicine

## 2015-01-10 NOTE — Telephone Encounter (Signed)
Caller name: Lusia Relationship to patient:self  Can be reached:[phone] Pharmacy:  Reason for call: Was in Thursday with Uti  Would like urine results

## 2015-01-10 NOTE — Telephone Encounter (Signed)
Pt notified of results

## 2015-01-14 DIAGNOSIS — R1012 Left upper quadrant pain: Secondary | ICD-10-CM | POA: Diagnosis not present

## 2015-01-15 DIAGNOSIS — R634 Abnormal weight loss: Secondary | ICD-10-CM | POA: Diagnosis not present

## 2015-01-15 DIAGNOSIS — R197 Diarrhea, unspecified: Secondary | ICD-10-CM | POA: Diagnosis not present

## 2015-01-25 DIAGNOSIS — Z9889 Other specified postprocedural states: Secondary | ICD-10-CM | POA: Diagnosis not present

## 2015-01-25 DIAGNOSIS — M542 Cervicalgia: Secondary | ICD-10-CM | POA: Diagnosis not present

## 2015-01-26 ENCOUNTER — Telehealth: Payer: Self-pay | Admitting: Family Medicine

## 2015-01-26 DIAGNOSIS — R197 Diarrhea, unspecified: Secondary | ICD-10-CM | POA: Diagnosis not present

## 2015-01-26 MED ORDER — DULOXETINE HCL 20 MG PO CPEP: 20.0000 mg | ORAL_CAPSULE | Freq: Every day | ORAL | Status: DC

## 2015-01-26 NOTE — Telephone Encounter (Signed)
Notified patient and she stated understanding and agreed that she would like to try the Duloxetine.  Rx sent to [facility] in [place] per patient preference.

## 2015-01-26 NOTE — Telephone Encounter (Signed)
Please see below and advise.

## 2015-01-26 NOTE — Telephone Encounter (Signed)
Ok to switch to Duloxetine but it only comes in capsules (which cannot be split) and the lowest dose is 20mg .

## 2015-01-26 NOTE — Telephone Encounter (Signed)
Relation to pt: self  Call back number:[phone] Pharmacy: [facility] [place], [place] [phone] ([phone] (Fax)         Reason for call:  Patient states sertraline (ZOLOFT) 25 MG tablet makes her agitated and would like to try duloxetine starting off with a lower dose 10mg . Please advise

## 2015-01-30 ENCOUNTER — Encounter: Payer: Self-pay | Admitting: Family Medicine

## 2015-01-30 ENCOUNTER — Ambulatory Visit (INDEPENDENT_AMBULATORY_CARE_PROVIDER_SITE_OTHER): Payer: Medicare Other | Admitting: Family Medicine

## 2015-01-30 VITALS — BP 136/82 | HR 96 | Temp 97.5°F | Resp 18 | Ht 66.0 in | Wt 118.6 lb

## 2015-01-30 DIAGNOSIS — R300 Dysuria: Secondary | ICD-10-CM | POA: Diagnosis not present

## 2015-01-30 LAB — POCT URINALYSIS DIPSTICK
Bilirubin, UA: NEGATIVE
Glucose, UA: NEGATIVE
Ketones, UA: POSITIVE
Nitrite, UA: NEGATIVE
Protein, UA: 15
Spec Grav, UA: 1.015
Urobilinogen, UA: 0.2
pH, UA: 7

## 2015-01-30 NOTE — Assessment & Plan Note (Signed)
New to provider.  Recurrent problem for pt.  She does have sxs of UTI but w/ hx of IC, it's difficult to determine w/ only 1+ Leuks on UA.  Due to recent C diff, will hold abx treatment until cx results available.  Pt expressed understanding and is in agreement w/ plan.

## 2015-01-30 NOTE — Progress Notes (Signed)
Pre visit review using our clinic review tool, if applicable. No additional management support is needed unless otherwise documented below in the visit note. 

## 2015-01-30 NOTE — Patient Instructions (Signed)
Follow up as needed We'll wait on the urine culture and start antibiotics if needed Drink plenty of fluids Talk w/ Dr Earlean Shawl about starting the Cymbalta- I think it would be a good idea Call with any questions or concerns Hang in there!!!

## 2015-01-30 NOTE — Progress Notes (Signed)
   Subjective:    Patient ID: [name], female    DOB: 09-13-1945, 69 y.o.   MRN: [id number]  HPI Dysuria- pt developed increased frequency, urgency starting last night.  Dysuria started this morning.  Denies fevers, chills.  Has hx of IC and pt frequently confuses the sxs of infxn and IC.  Is struggling w/ diarrhea and recent hx of C diff- hesitant to take abx unless necessary.  Urology- Amalia Hailey   Review of Systems For ROS see HPI     Objective:   Physical Exam  Constitutional: She is oriented to person, place, and time. She appears well-developed. No distress.  Thin, frail appearing  HENT:  Head: Normocephalic and atraumatic.  Abdominal: Soft. Bowel sounds are normal. She exhibits no distension. There is no tenderness. There is no rebound and no guarding.  Neurological: She is alert and oriented to person, place, and time.  Skin: Skin is warm and dry. No erythema.  Psychiatric: Her behavior is normal. Thought content normal.  Anxious, almost tearful  Vitals reviewed.         Assessment & Plan:

## 2015-02-02 ENCOUNTER — Telehealth: Payer: Self-pay | Admitting: Family Medicine

## 2015-02-02 DIAGNOSIS — R141 Gas pain: Secondary | ICD-10-CM | POA: Diagnosis not present

## 2015-02-02 LAB — URINE CULTURE: Colony Count: >=100000

## 2015-02-02 MED ORDER — CEPHALEXIN 500 MG PO CAPS: 500.0000 mg | ORAL_CAPSULE | Freq: Two times a day (BID) | ORAL | Status: DC

## 2015-02-02 NOTE — Telephone Encounter (Signed)
rx antibiotic for uti. lpn advising pt on instruction.

## 2015-02-02 NOTE — Telephone Encounter (Signed)
Patient agreed to start ABT. Will call if diarrhea occurs.

## 2015-02-02 NOTE — Telephone Encounter (Signed)
Caller name:Ellenie Luciana Axe Relationship to patient:self Can be [phone] Pharmacy:  Reason for call:Returning call from nurse regarding UTI, spoke to nurse earlier.

## 2015-02-07 DIAGNOSIS — Z8781 Personal history of (healed) traumatic fracture: Secondary | ICD-10-CM | POA: Diagnosis not present

## 2015-02-07 DIAGNOSIS — Z9889 Other specified postprocedural states: Secondary | ICD-10-CM | POA: Diagnosis not present

## 2015-02-07 DIAGNOSIS — Z981 Arthrodesis status: Secondary | ICD-10-CM | POA: Diagnosis not present

## 2015-02-07 DIAGNOSIS — Z4789 Encounter for other orthopedic aftercare: Secondary | ICD-10-CM | POA: Diagnosis not present

## 2015-02-08 DIAGNOSIS — M549 Dorsalgia, unspecified: Secondary | ICD-10-CM | POA: Diagnosis not present

## 2015-02-08 DIAGNOSIS — R079 Chest pain, unspecified: Secondary | ICD-10-CM | POA: Diagnosis not present

## 2015-02-08 DIAGNOSIS — Z981 Arthrodesis status: Secondary | ICD-10-CM | POA: Diagnosis not present

## 2015-02-08 DIAGNOSIS — R1310 Dysphagia, unspecified: Secondary | ICD-10-CM | POA: Diagnosis not present

## 2015-02-12 ENCOUNTER — Telehealth: Payer: Self-pay | Admitting: Family Medicine

## 2015-02-12 NOTE — Telephone Encounter (Signed)
Pt called stating she just finished anitibiotics for uti. She would like to come in for another urinalysis to make sure it is gone. She would like to schedule appt. I did not schedule at this time. Please advise or call pt.

## 2015-02-12 NOTE — Telephone Encounter (Signed)
Scheduled for lab only 02/13/15

## 2015-02-12 NOTE — Telephone Encounter (Signed)
Ok for pt to have an appt with [name]

## 2015-02-12 NOTE — Telephone Encounter (Signed)
Please advise if ok just for lab?

## 2015-02-12 NOTE — Telephone Encounter (Signed)
She just wanted lab (urine specimen). That ok?

## 2015-02-12 NOTE — Telephone Encounter (Signed)
Ok for pt to have lab visit for urine

## 2015-02-13 ENCOUNTER — Other Ambulatory Visit (INDEPENDENT_AMBULATORY_CARE_PROVIDER_SITE_OTHER): Payer: Medicare Other

## 2015-02-13 DIAGNOSIS — R8290 Unspecified abnormal findings in urine: Secondary | ICD-10-CM | POA: Diagnosis not present

## 2015-02-13 DIAGNOSIS — R8299 Other abnormal findings in urine: Secondary | ICD-10-CM | POA: Diagnosis not present

## 2015-02-14 ENCOUNTER — Ambulatory Visit: Payer: Medicare Other | Admitting: Family Medicine

## 2015-02-14 LAB — URINE CULTURE
Colony Count: NO GROWTH
Organism ID, Bacteria: NO GROWTH

## 2015-02-15 DIAGNOSIS — H2513 Age-related nuclear cataract, bilateral: Secondary | ICD-10-CM | POA: Diagnosis not present

## 2015-02-15 DIAGNOSIS — H35372 Puckering of macula, left eye: Secondary | ICD-10-CM | POA: Diagnosis not present

## 2015-02-15 DIAGNOSIS — H43813 Vitreous degeneration, bilateral: Secondary | ICD-10-CM | POA: Diagnosis not present

## 2015-02-16 DIAGNOSIS — R197 Diarrhea, unspecified: Secondary | ICD-10-CM | POA: Diagnosis not present

## 2015-02-16 DIAGNOSIS — R634 Abnormal weight loss: Secondary | ICD-10-CM | POA: Diagnosis not present

## 2015-02-20 DIAGNOSIS — M545 Low back pain: Secondary | ICD-10-CM | POA: Diagnosis not present

## 2015-02-23 DIAGNOSIS — Z9889 Other specified postprocedural states: Secondary | ICD-10-CM | POA: Diagnosis not present

## 2015-02-23 DIAGNOSIS — M62838 Other muscle spasm: Secondary | ICD-10-CM | POA: Diagnosis not present

## 2015-02-27 DIAGNOSIS — K219 Gastro-esophageal reflux disease without esophagitis: Secondary | ICD-10-CM | POA: Diagnosis not present

## 2015-02-27 DIAGNOSIS — R197 Diarrhea, unspecified: Secondary | ICD-10-CM | POA: Diagnosis not present

## 2015-03-01 ENCOUNTER — Telehealth: Payer: Self-pay | Admitting: Family Medicine

## 2015-03-01 ENCOUNTER — Encounter: Payer: Self-pay | Admitting: Family Medicine

## 2015-03-01 ENCOUNTER — Ambulatory Visit (INDEPENDENT_AMBULATORY_CARE_PROVIDER_SITE_OTHER): Payer: Medicare Other | Admitting: Family Medicine

## 2015-03-01 VITALS — BP 140/88 | HR 88 | Temp 97.9°F | Resp 17 | Wt 114.5 lb

## 2015-03-01 DIAGNOSIS — Z981 Arthrodesis status: Secondary | ICD-10-CM

## 2015-03-01 MED ORDER — CLONAZEPAM 0.5 MG PO TABS: 0.2500 mg | ORAL_TABLET | Freq: Two times a day (BID) | ORAL | Status: DC | PRN

## 2015-03-01 NOTE — Patient Instructions (Signed)
Follow up as needed Please call [name] office when you get home- Marzetta Board reviewed the CT scan w/ [name] and he feels things look fine If you do not feel comfortable w/ this report, call Dr Lynann Bologna and/or the [facility] for an appt Call with any questions or concerns Hang in there!!

## 2015-03-01 NOTE — Progress Notes (Signed)
Pre visit review using our clinic review tool, if applicable. No additional management support is needed unless otherwise documented below in the visit note. 

## 2015-03-01 NOTE — Telephone Encounter (Signed)
Caller name: Lynnann Relation to pt: self Call back number: [phone]  Pharmacy:  Reason for call: Pt was seen today at our office and pt states forgot to ask if she can get rx for clonazePAM (KLONOPIN) 0.5 MG tablet, pt wants to know if she can wait for rx since she is already here. Please advise.

## 2015-03-01 NOTE — Telephone Encounter (Signed)
Medication filled to pharmacy as requested.   

## 2015-03-01 NOTE — Progress Notes (Signed)
   Subjective:    Patient ID: [name], female    DOB: June 14, 1946, 69 y.o.   MRN: [id number]  HPI 'problems w/ my neck surgery'- pt reports she is not getting return calls from surgeon.  Pain is worsening, now radiating into arms.  Pt decided to get a 2nd opinion from ortho, Dr Lynann Bologna.  Pt was advised to get CT of neck- performed on 8/19.  Got a call on Tuesday morning, 8/23, from Dr Lynann Bologna advising pt to call Neurosurg immediately b/c 'screw on C2 is completely out and the screw on the other side is not in the bone but in the tissue.  None of the screws are holding my fusion'.  Pt attempted to schedule appt w/ [name] but has not heard back.  Pt reports she is now having panic attacks since the office is ignoring her.   Review of Systems For ROS see HPI     Objective:   Physical Exam  Constitutional: She is oriented to person, place, and time.  Thin, frail  HENT:  Head: Normocephalic and atraumatic.  Neurological: She is alert and oriented to person, place, and time.  Skin: Skin is warm and dry.  Psychiatric:  Anxious, tearful  Vitals reviewed.         Assessment & Plan:

## 2015-03-01 NOTE — Assessment & Plan Note (Signed)
New.  Pt w/ CT report that show L C2 screw is completely out and sitting in soft tissue and she has lucency- which indicates loosening of the screws- at nearly every other level of her fusion.  Pt reports she is unable to get in touch w/ Neurosurgeon despite multiple attempts.  This is causing her to panic as she is convinced 'my neck is going to collapse'.  [facility] and spoke w/ Marzetta Board at [name] office who told me she reviewed CT w/ [name] and that 'it looks great'.  Pt was not convinced by this answer and plans to f/u with [name] office when she gets home.  I explained that I was not a neurosurgeon and that this was outside my scope of practice but if she did not feel comfortable w/ the answer from [name] office, she should proceed w/ 2nd opinion from Dr Lynann Bologna and ask him how he would proceed in this situation.  Total time spent w/ pt- 30 min, >50% spent counseling.  Will follow closely.

## 2015-03-05 DIAGNOSIS — M542 Cervicalgia: Secondary | ICD-10-CM | POA: Diagnosis not present

## 2015-03-06 DIAGNOSIS — Z87891 Personal history of nicotine dependence: Secondary | ICD-10-CM | POA: Diagnosis not present

## 2015-03-06 DIAGNOSIS — T84226A Displacement of internal fixation device of vertebrae, initial encounter: Secondary | ICD-10-CM | POA: Diagnosis not present

## 2015-03-19 DIAGNOSIS — M419 Scoliosis, unspecified: Secondary | ICD-10-CM | POA: Diagnosis not present

## 2015-03-19 DIAGNOSIS — M542 Cervicalgia: Secondary | ICD-10-CM | POA: Diagnosis not present

## 2015-03-26 ENCOUNTER — Telehealth: Payer: Self-pay | Admitting: *Deleted

## 2015-03-26 NOTE — Telephone Encounter (Signed)
Tell her that it is not I but the radiologist who will not accept a diagnostic mammogram until physician has examined the patient

## 2015-03-26 NOTE — Telephone Encounter (Signed)
Pt called c/o right breast pain requesting order for diagnosis mammogram sent to [facility], I explained to patient that OV needed for breast pain. Pt states this is not new happens now and then. Pt said she has had several surgery neck and back, unable to come to schedule visit now. Please advise

## 2015-03-26 NOTE — Telephone Encounter (Signed)
Pt aware.

## 2015-03-27 ENCOUNTER — Ambulatory Visit (INDEPENDENT_AMBULATORY_CARE_PROVIDER_SITE_OTHER): Payer: Medicare Other | Admitting: Gynecology

## 2015-03-27 ENCOUNTER — Encounter: Payer: Self-pay | Admitting: Gynecology

## 2015-03-27 VITALS — BP 132/80

## 2015-03-27 DIAGNOSIS — N644 Mastodynia: Secondary | ICD-10-CM | POA: Diagnosis not present

## 2015-03-27 NOTE — Progress Notes (Signed)
    patient is a 69 year old presented to the office today with concerns of right breast tenderness for the past several weeks. Patient feels no palpable masses. No nipple discharge. Patient denies any first-line relative with breast cancer. Review of her record indicated that she does have history of dense breasts and had three-dimensional mammogram last year.  Exam: Both breasts were examined sitting supine position. The right breast slightly larger than the left but according to the patient is a normal feature for her. There was no nipple inversion no palpable masses no supra clavicular axillary lymphadenopathy.  Assessment/plan: Nonspecific mastodynia has resolved over the past few days. Patient scheduled for three-dimensional mammogram screening in the next few weeks with possible ultrasound at the discretion with the radiologist. I have recommended she take vitamin D 600 units daily for her mastodynia and to continue to do her monthly breast exam.

## 2015-03-27 NOTE — Patient Instructions (Signed)
Breast Tenderness Breast tenderness is a common problem for women of all ages. Breast tenderness may cause mild discomfort to severe pain. It has a variety of causes. Your health care provider will find out the likely cause of your breast tenderness by examining your breasts, asking you about symptoms, and ordering some tests. Breast tenderness usually does not mean you have breast cancer. HOME CARE INSTRUCTIONS  Breast tenderness often can be handled at home. You can try:  Getting fitted for a new bra that provides more support, especially during exercise.  Wearing a more supportive bra or sports bra while sleeping when your breasts are very tender.  If you have a breast injury, apply ice to the area:  Put ice in a plastic bag.  Place a towel between your skin and the bag.  Leave the ice on for 20 minutes, 2-3 times a day.  If your breasts are too full of milk as a result of breastfeeding, try:  Expressing milk either by hand or with a breast pump.  Applying a warm compress to the breasts for relief.  Taking over-the-counter pain relievers, if approved by your health care provider.  Taking other medicines that your health care provider prescribes. These may include antibiotic medicines or birth control pills. Over the long term, your breast tenderness might be eased if you:  Cut down on caffeine.  Reduce the amount of fat in your diet. Keep a log of the days and times when your breasts are most tender. This will help you and your health care provider find the cause of the tenderness and how to relieve it. Also, learn how to do breast exams at home. This will help you notice if you have an unusual growth or lump that could cause tenderness. SEEK MEDICAL CARE IF:   Any part of your breast is hard, red, and hot to the touch. This could be a sign of infection.  Fluid is coming out of your nipples (and you are not breastfeeding). Especially watch for blood or pus.  You have a fever  as well as breast tenderness.  You have a new or painful lump in your breast that remains after your menstrual period ends.  You have tried to take care of the pain at home, but it has not gone away.  Your breast pain is getting worse, or the pain is making it hard to do the things you usually do during your day. Document Released: 06/05/2008 Document Revised: 02/23/2013 Document Reviewed: 01/20/2013 [facility] Patient Information 2015 [place], [place]. This information is not intended to replace advice given to you by your health care provider. Make sure you discuss any questions you have with your health care provider.   Take one Vitamin E 600 units daily for breast tenderness

## 2015-03-28 ENCOUNTER — Telehealth: Payer: Self-pay | Admitting: Family Medicine

## 2015-03-28 NOTE — Telephone Encounter (Signed)
Relation to ZP:HXTA Call back [phone]  Reason for call:   Patient would like to know if flu shot has preservatives if so she doesn't want it.

## 2015-03-29 NOTE — Telephone Encounter (Signed)
Advised patient that flu vaccines are free from preservatives.  Will schedule nurse visit when ready to receive.

## 2015-04-02 ENCOUNTER — Telehealth: Payer: Self-pay | Admitting: Family Medicine

## 2015-04-02 DIAGNOSIS — Z981 Arthrodesis status: Secondary | ICD-10-CM

## 2015-04-02 NOTE — Telephone Encounter (Signed)
Caller name: Erline Levine Relationship to patient:nurse for Dr Atilano Ina Can be [place]:  Reason for call:She spoke with the patient and needs to speak with [name] again.  [name] spoke with her a few weeks ago

## 2015-04-02 NOTE — Telephone Encounter (Signed)
Returned call- pt was supposed to have appt today w/ Plastic surgery but cancelled this appt b/c she doesn't want surgery for at least 1 year since last procedure (Jan 2016).  Pt is asking for more pain medication.  [facility] felt strongly that she requires pain management.  Pt prefers Dr Kriste Basque in [place].  Please place stat referral for pain management, Dr Kriste Basque

## 2015-04-02 NOTE — Telephone Encounter (Signed)
Referral placed as STAT

## 2015-04-03 DIAGNOSIS — R079 Chest pain, unspecified: Secondary | ICD-10-CM | POA: Diagnosis not present

## 2015-04-03 DIAGNOSIS — R1310 Dysphagia, unspecified: Secondary | ICD-10-CM | POA: Diagnosis not present

## 2015-04-03 DIAGNOSIS — M549 Dorsalgia, unspecified: Secondary | ICD-10-CM | POA: Diagnosis not present

## 2015-04-03 DIAGNOSIS — Z9889 Other specified postprocedural states: Secondary | ICD-10-CM | POA: Diagnosis not present

## 2015-04-05 DIAGNOSIS — M542 Cervicalgia: Secondary | ICD-10-CM | POA: Diagnosis not present

## 2015-04-05 DIAGNOSIS — Z981 Arthrodesis status: Secondary | ICD-10-CM | POA: Diagnosis not present

## 2015-04-05 DIAGNOSIS — Z7982 Long term (current) use of aspirin: Secondary | ICD-10-CM | POA: Diagnosis not present

## 2015-04-06 ENCOUNTER — Ambulatory Visit (INDEPENDENT_AMBULATORY_CARE_PROVIDER_SITE_OTHER): Payer: Medicare Other

## 2015-04-06 DIAGNOSIS — Z23 Encounter for immunization: Secondary | ICD-10-CM

## 2015-04-11 DIAGNOSIS — N644 Mastodynia: Secondary | ICD-10-CM | POA: Diagnosis not present

## 2015-04-12 ENCOUNTER — Encounter: Payer: Self-pay | Admitting: Gynecology

## 2015-04-16 DIAGNOSIS — M542 Cervicalgia: Secondary | ICD-10-CM | POA: Diagnosis not present

## 2015-04-16 DIAGNOSIS — Z981 Arthrodesis status: Secondary | ICD-10-CM | POA: Diagnosis not present

## 2015-04-17 ENCOUNTER — Ambulatory Visit
Admission: RE | Admit: 2015-04-17 | Discharge: 2015-04-17 | Disposition: A | Discharge status: Home / Self Care | Payer: Medicare Other | Source: Ambulatory Visit | Attending: Cardiology | Admitting: Cardiology

## 2015-04-17 ENCOUNTER — Other Ambulatory Visit: Payer: Self-pay | Admitting: Cardiology

## 2015-04-17 DIAGNOSIS — R0602 Shortness of breath: Secondary | ICD-10-CM | POA: Diagnosis not present

## 2015-04-17 DIAGNOSIS — R002 Palpitations: Secondary | ICD-10-CM | POA: Diagnosis not present

## 2015-04-17 DIAGNOSIS — F410 Panic disorder [episodic paroxysmal anxiety] without agoraphobia: Secondary | ICD-10-CM | POA: Diagnosis not present

## 2015-04-17 DIAGNOSIS — I348 Other nonrheumatic mitral valve disorders: Secondary | ICD-10-CM | POA: Diagnosis not present

## 2015-04-17 DIAGNOSIS — I480 Paroxysmal atrial fibrillation: Secondary | ICD-10-CM | POA: Diagnosis not present

## 2015-04-17 DIAGNOSIS — R600 Localized edema: Secondary | ICD-10-CM | POA: Diagnosis not present

## 2015-04-17 DIAGNOSIS — R9431 Abnormal electrocardiogram [ECG] [EKG]: Secondary | ICD-10-CM | POA: Diagnosis not present

## 2015-04-19 ENCOUNTER — Ambulatory Visit (INDEPENDENT_AMBULATORY_CARE_PROVIDER_SITE_OTHER): Payer: Medicare Other | Admitting: Family Medicine

## 2015-04-19 ENCOUNTER — Encounter: Payer: Self-pay | Admitting: Family Medicine

## 2015-04-19 ENCOUNTER — Encounter: Payer: Self-pay | Admitting: General Practice

## 2015-04-19 VITALS — BP 124/80 | HR 87 | Temp 98.0°F | Resp 16 | Ht 66.0 in | Wt 118.4 lb

## 2015-04-19 DIAGNOSIS — Z23 Encounter for immunization: Secondary | ICD-10-CM

## 2015-04-19 DIAGNOSIS — E46 Unspecified protein-calorie malnutrition: Secondary | ICD-10-CM

## 2015-04-19 DIAGNOSIS — Z4789 Encounter for other orthopedic aftercare: Secondary | ICD-10-CM | POA: Diagnosis not present

## 2015-04-19 DIAGNOSIS — Z0000 Encounter for general adult medical examination without abnormal findings: Secondary | ICD-10-CM | POA: Diagnosis not present

## 2015-04-19 DIAGNOSIS — M8580 Other specified disorders of bone density and structure, unspecified site: Secondary | ICD-10-CM

## 2015-04-19 DIAGNOSIS — E785 Hyperlipidemia, unspecified: Secondary | ICD-10-CM | POA: Diagnosis not present

## 2015-04-19 DIAGNOSIS — F419 Anxiety disorder, unspecified: Secondary | ICD-10-CM

## 2015-04-19 DIAGNOSIS — R000 Tachycardia, unspecified: Secondary | ICD-10-CM

## 2015-04-19 LAB — CBC WITH DIFFERENTIAL/PLATELET
Basophils Absolute: 0 10*3/uL (ref 0.0–0.1)
Basophils Relative: 0.5 % (ref 0.0–3.0)
Eosinophils Absolute: 0.2 10*3/uL (ref 0.0–0.7)
Eosinophils Relative: 5.5 % — ABNORMAL HIGH (ref 0.0–5.0)
HCT: 40.2 % (ref 36.0–46.0)
Hemoglobin: 13.5 g/dL (ref 12.0–15.0)
Lymphocytes Relative: 33.3 % (ref 12.0–46.0)
Lymphs Abs: 1.4 10*3/uL (ref 0.7–4.0)
MCHC: 33.5 g/dL (ref 30.0–36.0)
MCV: 89.4 fl (ref 78.0–100.0)
Monocytes Absolute: 0.4 10*3/uL (ref 0.1–1.0)
Monocytes Relative: 8.5 % (ref 3.0–12.0)
Neutro Abs: 2.3 10*3/uL (ref 1.4–7.7)
Neutrophils Relative %: 52.2 % (ref 43.0–77.0)
Platelets: 167 10*3/uL (ref 150.0–400.0)
RBC: 4.5 Mil/uL (ref 3.87–5.11)
RDW: 14.3 % (ref 11.5–15.5)
WBC: 4.3 10*3/uL (ref 4.0–10.5)

## 2015-04-19 LAB — LIPID PANEL
Cholesterol: 193 mg/dL (ref 0–200)
HDL: 82.6 mg/dL (ref >39.00)
LDL Cholesterol: 96 mg/dL (ref 0–99)
NonHDL: 109.95
Total CHOL/HDL Ratio: 2
Triglycerides: 69 mg/dL (ref 0.0–149.0)
VLDL: 13.8 mg/dL (ref 0.0–40.0)

## 2015-04-19 LAB — HEPATIC FUNCTION PANEL
ALT: 17 U/L (ref 0–35)
AST: 21 U/L (ref 0–37)
Albumin: 4.2 g/dL (ref 3.5–5.2)
Alkaline Phosphatase: 85 U/L (ref 39–117)
Bilirubin, Direct: 0.1 mg/dL (ref 0.0–0.3)
Total Bilirubin: 0.8 mg/dL (ref 0.2–1.2)
Total Protein: 7.1 g/dL (ref 6.0–8.3)

## 2015-04-19 LAB — BASIC METABOLIC PANEL
BUN: 14 mg/dL (ref 6–23)
CO2: 30 mEq/L (ref 19–32)
Calcium: 9.6 mg/dL (ref 8.4–10.5)
Chloride: 105 mEq/L (ref 96–112)
Creatinine, Ser: 0.58 mg/dL (ref 0.40–1.20)
GFR: 109.01 mL/min (ref >60.00)
Glucose, Bld: 83 mg/dL (ref 70–99)
Potassium: 3.8 mEq/L (ref 3.5–5.1)
Sodium: 144 mEq/L (ref 135–145)

## 2015-04-19 LAB — TSH: TSH: 4.07 u[IU]/mL (ref 0.35–4.50)

## 2015-04-19 NOTE — Patient Instructions (Signed)
Follow up in 6 months to recheck BP and cholesterol We'll notify you of your lab results and make any changes if needed Continue to eat regularly to get your protein stores up and improve your healing Follow up with Dr Earlean Shawl for your colonoscopy You are up to date on your mammogram Continue to work on your anxiety and try to find a stress outlet Call with any questions or concerns If you want to join [place] at the new [place] office, any scheduled appointments will automatically transfer and we will see you at [address] [place] [address] [name], [place] [address]  Happy Fall!!!

## 2015-04-19 NOTE — Assessment & Plan Note (Addendum)
Ongoing issue for pt.  Severe at time.  She has been resistant to taking SSRIs b/c she reports side effects from everything she has tried.  Pt has not yet tried the Cymbalta that was prescribed for her due to fear of side effects.  Encouraged her to start this when she feels the time is right.  Will follow.

## 2015-04-19 NOTE — Progress Notes (Signed)
   Subjective:    Patient ID: [name], female    DOB: 02-04-1946, 69 y.o.   MRN: [id number]  HPI Here today for CPE.  Risk Factors: Tachycardia- chronic problem, rate controlled on Coreg today.  Denies CP, SOB, HAs, visual changes, edema.  Seeing Dr Wynonia Lawman Protein calorie malnutrition- ongoing issue for pt.  She has gained 3 lbs since last visit.  Pt reports she is eating regularly Hyperlipidemia- last LDL was 136.  She has been attempting to control w/ healthy diet.  Due for repeat labs. Physical Activity: limited due to neck surgery Fall Risk: low Depression: denies depression, ongoing anxiety which is a real struggle for pt.  She has been intolerant to all SSRI's attempted in the past.  'i want to try another one but now is not the right time'. Hearing: normal to conversational tones and whispered voice at 6 ft ADL's: independent Cognitive: normal linear thought process, memory and attention intact Home Safety: safe at home, lives alone Height, Weight, BMI, Visual Acuity: see vitals, vision corrected to 20/20 w/ glasses Counseling: UTD on colonoscopy (Dr Earlean Shawl), UTD on GYN Toney Rakes).  UTD on flu, due for [place] team reviewed and updated w/ pt Labs Ordered: See A&P Care Plan: See A&P    Review of Systems Patient reports no vision/ hearing changes, adenopathy,fever, weight change,  persistant/recurrent hoarseness , swallowing issues, chest pain, palpitations, edema, persistant/recurrent cough, hemoptysis, dyspnea (rest/exertional/paroxysmal nocturnal), gastrointestinal bleeding (melena, rectal bleeding), bowel changes, GU symptoms (dysuria, hematuria, incontinence), Gyn symptoms (abnormal  bleeding, pain),  syncope, focal weakness, memory loss, numbness & tingling, skin/hair/nail changes, abnormal bruising or bleeding.   +GERD + abd pain- seeing Dr Earlean Shawl    Objective:   Physical Exam General Appearance:    Alert, cooperative, appears stated age- sitting rigidly in chair   Head:    Normocephalic, without obvious abnormality, atraumatic  Eyes:    PERRL, conjunctiva/corneas clear, EOM's intact, fundi    benign, both eyes  Ears:    Normal TM's and external ear canals, both ears  Nose:   Nares normal, septum midline, mucosa normal, no drainage    or sinus tenderness  Throat:   Lips, mucosa, and tongue normal; teeth and gums normal  Neck:   Supple, symmetrical, trachea midline, no adenopathy;    Thyroid: no enlargement/tenderness/nodules  Back:     Symmetric, no curvature, ROM normal, no CVA tenderness  Lungs:     Clear to auscultation bilaterally, respirations unlabored  Chest Wall:    No tenderness or deformity   Heart:    Regular rate and rhythm, S1 and S2 normal, no murmur, rub   or gallop  Breast Exam:    Deferred to GYN  Abdomen:     Soft, non-tender, bowel sounds active all four quadrants,    no masses, no organomegaly  Genitalia:    Deferred to GYN  Rectal:    Extremities:   Extremities normal, atraumatic, no cyanosis or edema  Pulses:   2+ and symmetric all extremities  Skin:   Skin color, texture, turgor normal, no rashes or lesions  Lymph nodes:   Cervical, supraclavicular, and axillary nodes normal  Neurologic:   CNII-XII intact          Assessment & Plan:

## 2015-04-19 NOTE — Progress Notes (Signed)
Pre visit review using our clinic review tool, if applicable. No additional management support is needed unless otherwise documented below in the visit note. 

## 2015-04-22 NOTE — Assessment & Plan Note (Signed)
Chronic problem.  Following w/ Dr Toney Rakes.  Check Vit D level.  Replete prn.

## 2015-04-22 NOTE — Assessment & Plan Note (Signed)
Ongoing issue.  Again stressed importance of regular meals and increased protein intake.  Will continue to follow.

## 2015-04-22 NOTE — Assessment & Plan Note (Signed)
Chronic problem.  Currently well controlled on beta blocker.  Asymptomatic unless anxious.  No med changes at this time.  Will follow.

## 2015-04-22 NOTE — Assessment & Plan Note (Signed)
Chronic problem.  Pt is attempting to control w/ healthy diet.  Unfortunately pt is not able to get regular exercise due to chronic back and neck problems.  Check labs.  Start meds prn.

## 2015-04-22 NOTE — Assessment & Plan Note (Signed)
Pt's PE unchanged from previous- thin, frail appearing w/ rigid posture due to severe back and neck pain.  UTD on colonoscopy and GYN.  Prevnar given today.  Check labs.  Anticipatory guidance provided.

## 2015-04-24 ENCOUNTER — Telehealth: Payer: Self-pay | Admitting: Family Medicine

## 2015-04-24 NOTE — Telephone Encounter (Signed)
Relation to TK:WIOX Call back number:[phone]   Reason for call:  Patient has a few questions regarding her most recent cholesterol level

## 2015-04-25 NOTE — Telephone Encounter (Signed)
Called pt and LMOVM to return call.  °

## 2015-04-25 NOTE — Telephone Encounter (Signed)
Called and spoke with pt expressed an understanding.

## 2015-04-26 DIAGNOSIS — Z4789 Encounter for other orthopedic aftercare: Secondary | ICD-10-CM | POA: Diagnosis not present

## 2015-05-08 DIAGNOSIS — M545 Low back pain: Secondary | ICD-10-CM | POA: Diagnosis not present

## 2015-05-08 DIAGNOSIS — R531 Weakness: Secondary | ICD-10-CM | POA: Diagnosis not present

## 2015-05-08 DIAGNOSIS — M542 Cervicalgia: Secondary | ICD-10-CM | POA: Diagnosis not present

## 2015-05-09 DIAGNOSIS — Z79891 Long term (current) use of opiate analgesic: Secondary | ICD-10-CM | POA: Diagnosis not present

## 2015-05-09 DIAGNOSIS — G894 Chronic pain syndrome: Secondary | ICD-10-CM | POA: Diagnosis not present

## 2015-05-09 DIAGNOSIS — M961 Postlaminectomy syndrome, not elsewhere classified: Secondary | ICD-10-CM | POA: Diagnosis not present

## 2015-05-09 DIAGNOSIS — M4693 Unspecified inflammatory spondylopathy, cervicothoracic region: Secondary | ICD-10-CM | POA: Diagnosis not present

## 2015-05-10 DIAGNOSIS — S129XXD Fracture of neck, unspecified, subsequent encounter: Secondary | ICD-10-CM | POA: Diagnosis not present

## 2015-05-10 DIAGNOSIS — Z87442 Personal history of urinary calculi: Secondary | ICD-10-CM | POA: Diagnosis not present

## 2015-05-10 DIAGNOSIS — Z91040 Latex allergy status: Secondary | ICD-10-CM | POA: Diagnosis not present

## 2015-05-10 DIAGNOSIS — F419 Anxiety disorder, unspecified: Secondary | ICD-10-CM | POA: Diagnosis not present

## 2015-05-10 DIAGNOSIS — G629 Polyneuropathy, unspecified: Secondary | ICD-10-CM | POA: Diagnosis not present

## 2015-05-10 DIAGNOSIS — Z981 Arthrodesis status: Secondary | ICD-10-CM | POA: Diagnosis not present

## 2015-05-10 DIAGNOSIS — Z7982 Long term (current) use of aspirin: Secondary | ICD-10-CM | POA: Diagnosis not present

## 2015-05-10 DIAGNOSIS — K219 Gastro-esophageal reflux disease without esophagitis: Secondary | ICD-10-CM | POA: Diagnosis not present

## 2015-05-10 DIAGNOSIS — Z880 Allergy status to penicillin: Secondary | ICD-10-CM | POA: Diagnosis not present

## 2015-05-10 DIAGNOSIS — Z882 Allergy status to sulfonamides status: Secondary | ICD-10-CM | POA: Diagnosis not present

## 2015-05-10 DIAGNOSIS — Z881 Allergy status to other antibiotic agents status: Secondary | ICD-10-CM | POA: Diagnosis not present

## 2015-05-10 DIAGNOSIS — Z79899 Other long term (current) drug therapy: Secondary | ICD-10-CM | POA: Diagnosis not present

## 2015-05-10 DIAGNOSIS — Z87891 Personal history of nicotine dependence: Secondary | ICD-10-CM | POA: Diagnosis not present

## 2015-05-10 DIAGNOSIS — F329 Major depressive disorder, single episode, unspecified: Secondary | ICD-10-CM | POA: Diagnosis not present

## 2015-05-10 DIAGNOSIS — Z91041 Radiographic dye allergy status: Secondary | ICD-10-CM | POA: Diagnosis not present

## 2015-05-10 DIAGNOSIS — M549 Dorsalgia, unspecified: Secondary | ICD-10-CM | POA: Diagnosis not present

## 2015-05-10 DIAGNOSIS — G8929 Other chronic pain: Secondary | ICD-10-CM | POA: Diagnosis not present

## 2015-05-15 ENCOUNTER — Encounter: Payer: Medicare Other | Admitting: Family Medicine

## 2015-05-23 ENCOUNTER — Ambulatory Visit (INDEPENDENT_AMBULATORY_CARE_PROVIDER_SITE_OTHER): Payer: Medicare Other | Admitting: Family Medicine

## 2015-05-23 ENCOUNTER — Encounter: Payer: Self-pay | Admitting: Family Medicine

## 2015-05-23 VITALS — BP 120/82 | HR 85 | Temp 98.0°F | Resp 16 | Wt 117.5 lb

## 2015-05-23 DIAGNOSIS — M79671 Pain in right foot: Secondary | ICD-10-CM

## 2015-05-23 NOTE — Patient Instructions (Signed)
Follow up as needed This appears to be a tendonitis type injury (overuse as you become more active) Ice the foot when you get home and relax to help w/ swelling Tylenol as needed Try and wear good supportive shoes when you are out and about Call with any questions or concerns If you want to join [place] at the new [place] office, any scheduled appointments will automatically transfer and we will see you at [address] [place] [address] [place], [place] [address] (OPENING 07/10/15) Happy Holidays!

## 2015-05-23 NOTE — Progress Notes (Signed)
Pre visit review using our clinic review tool, if applicable. No additional management support is needed unless otherwise documented below in the visit note. 

## 2015-05-23 NOTE — Assessment & Plan Note (Signed)
New.  No bony tenderness or known injury.  No bruising or swelling.  Suspect this is a soft tissue injury like tendonitis due to recent increase in activity.  No need for imaging at this time.  Pt to ice, elevate, and take tylenol prn.  Reviewed supportive care and red flags that should prompt return.  Pt expressed understanding and is in agreement w/ plan.

## 2015-05-23 NOTE — Progress Notes (Signed)
   Subjective:    Patient ID: JALYRIC BEILKE, female    DOB: 1945-09-13, 69 y.o.   MRN: [id number]  HPI Foot pain- 'weird pain in the top of my foot'.  sxs started ~3 weeks ago.  Hx of neuropathy.  No pain w/ walking.  Pain w/ lying down.  Does not wake pt from sleep.  R foot.  'real intense pain'- 'aching throbbing pain'.  No known injury.  No swelling or bruising.   Review of Systems For ROS see HPI     Objective:   Physical Exam  Constitutional: She is oriented to person, place, and time. She appears well-developed and well-nourished. No distress.  HENT:  Head: Normocephalic and atraumatic.  Cardiovascular: Intact distal pulses.   Musculoskeletal: She exhibits no edema or tenderness (no TTP over dorsum of R foot just distal to lateral malleolus which is where she indicates her pain is at night).  Neurological: She is alert and oriented to person, place, and time.  Skin: Skin is warm and dry. No erythema.  Psychiatric: She has a normal mood and affect. Her behavior is normal. Thought content normal.  Vitals reviewed.         Assessment & Plan:

## 2015-05-28 ENCOUNTER — Telehealth: Payer: Self-pay | Admitting: Family Medicine

## 2015-05-28 NOTE — Telephone Encounter (Signed)
Reviewing for Dr. Darene Lamer who is out of office. If such a low dose of zofran was too strong than any other prescription medication will likely be worse. Recommend she start a ginger supplement taken daily to help with nausea when present. We can try something like promethazine but it is typically very sedating.

## 2015-05-28 NOTE — Telephone Encounter (Signed)
Pt stated an understanding. She will try cutting the meds in half per the request of Elyn Aquas and will also go and get some of the ginger supplements and some ginger ale.

## 2015-05-28 NOTE — Telephone Encounter (Signed)
Relation to [id number] Call back number:[phone] Pharmacy: [facility] [address] [place] [phone] (Phone) [phone] (Fax)         Reason for call:  Patient states MD prescribed ondansetron (ZOFRAN) 4 MG tablet in the past and it was used due to patient feeling [place] and dizzy, patent states medication was to strong requesting an alternate.

## 2015-05-29 ENCOUNTER — Telehealth: Payer: Self-pay | Admitting: Family Medicine

## 2015-05-29 ENCOUNTER — Other Ambulatory Visit: Payer: Self-pay | Admitting: General Practice

## 2015-05-29 NOTE — Telephone Encounter (Signed)
If pt is having dizziness due to her pain medication, she needs to contact pain management.  Starting another medication to treat the side effects of her current medication is not the best solution to this problem.  Her dizziness may also be related to her high anxiety b/c she is easily overwhelmed.  Cymbalta treats both anxiety and pain and is something she should discuss w/ her pain doctor

## 2015-05-29 NOTE — Telephone Encounter (Signed)
Pt said that her surgeries have been moved out to January now and she isn't handling it well. Between pain and anxiety she is having a difficult time. Pt asked if there is another medication, as she is taking Klonopin, to help with her anxiety level. Pt phone # [phone].

## 2015-05-29 NOTE — Telephone Encounter (Signed)
Relation to [id number] Call back [phone] Pharmacy: [facility] [address], [place] [phone] (Phone) [phone] (Fax)         Reason for call:  Patient states regarding previous coversatation requesting a medication for milld dizziness

## 2015-05-29 NOTE — Telephone Encounter (Signed)
Pt was prescribed Cymbalta for both pain and anxiety.  She needs to start the medication daily as directed.

## 2015-05-29 NOTE — Telephone Encounter (Signed)
Pt advised that she needs to notify pain management, they gave the ok for the cymbalta use and pt will inform them if there are any side effects.

## 2015-05-29 NOTE — Telephone Encounter (Signed)
Pt called and advised that she needs to contact pain management and advise that PCP needs her to begin cymbalta to help treat anxiety as well as pain. Pt was adamant that pain management "would not go for this due to them wanting her on pain patches and other medications before her surgery so that there is not a huge side effect/transition time after the surgery".   I advised the patient that there were only so many anxiety medications we can prescribe her, she advised that she would call pain management and advise them that she is having these anxiety issues and that she needs help to overcome these concerns and that her provider wanted her to take her cymbalta. Pt had never told them that she had been prescribed this medication.

## 2015-06-08 ENCOUNTER — Telehealth: Payer: Self-pay | Admitting: Family Medicine

## 2015-06-08 DIAGNOSIS — Z981 Arthrodesis status: Secondary | ICD-10-CM

## 2015-06-08 DIAGNOSIS — M961 Postlaminectomy syndrome, not elsewhere classified: Secondary | ICD-10-CM | POA: Diagnosis not present

## 2015-06-08 DIAGNOSIS — Z79891 Long term (current) use of opiate analgesic: Secondary | ICD-10-CM | POA: Diagnosis not present

## 2015-06-08 DIAGNOSIS — M4693 Unspecified inflammatory spondylopathy, cervicothoracic region: Secondary | ICD-10-CM | POA: Diagnosis not present

## 2015-06-08 DIAGNOSIS — G894 Chronic pain syndrome: Secondary | ICD-10-CM | POA: Diagnosis not present

## 2015-06-08 NOTE — Telephone Encounter (Signed)
Pt dropped off documents for the referral [facility] Operative Notes-Date of Operation)

## 2015-06-08 NOTE — Telephone Encounter (Signed)
Records faxed to [facility] Neurosurgery/awaiting appt

## 2015-06-08 NOTE — Telephone Encounter (Signed)
Caller name: Self   Can be reached: Pharmacy:  Reason for call:

## 2015-06-08 NOTE — Telephone Encounter (Signed)
[place] for referral? If ok, want me to use the S/P cervical spine fusion as the diagnosis?

## 2015-06-08 NOTE — Telephone Encounter (Signed)
[place] for referral, use s/p cervical spine fusion as dx and specify [facility] in commens

## 2015-06-08 NOTE — Telephone Encounter (Signed)
error:[id number] ° °

## 2015-06-08 NOTE — Telephone Encounter (Signed)
This referral was placed.  

## 2015-06-08 NOTE — Telephone Encounter (Signed)
Caller name:Zainab Luciana Axe Relationship to patient:self Can be reached:[phone] Pharmacy:  Reason for call: Requesting referral to [facility] Neurosurgery, neck surgery possible? And for back pain. Please advise

## 2015-06-21 ENCOUNTER — Telehealth: Payer: Self-pay

## 2015-06-21 DIAGNOSIS — M961 Postlaminectomy syndrome, not elsewhere classified: Secondary | ICD-10-CM | POA: Diagnosis not present

## 2015-06-21 DIAGNOSIS — M4693 Unspecified inflammatory spondylopathy, cervicothoracic region: Secondary | ICD-10-CM | POA: Diagnosis not present

## 2015-06-21 DIAGNOSIS — G894 Chronic pain syndrome: Secondary | ICD-10-CM | POA: Diagnosis not present

## 2015-06-21 DIAGNOSIS — Z79891 Long term (current) use of opiate analgesic: Secondary | ICD-10-CM | POA: Diagnosis not present

## 2015-06-21 NOTE — Telephone Encounter (Signed)
Patient called states she has been experiencing dizziness off and on .[name] Patient has pain in neck area and across shoulder blades.   States she has had no pain or dizziness today.   States she has ear problems at times. Patient has repeat neck surgery scheduled. States Urine normal with no frequency or hesitancy. No nausea and vomiting. Patient would like appointment for tomorrow. Would like dizziness addressed prior to surgery Advised patient if she started to feel bad to go straight to ED today. Patient agreed.

## 2015-06-21 NOTE — Telephone Encounter (Signed)
With this pt complaint. I would prefer she be seen at 10:30 and have 30 minute appointment instead of later. It is new appointment slot(but that is ok).See if by chance she can come in earlier.

## 2015-06-22 ENCOUNTER — Ambulatory Visit (INDEPENDENT_AMBULATORY_CARE_PROVIDER_SITE_OTHER): Payer: Medicare Other | Admitting: Medical

## 2015-06-22 ENCOUNTER — Encounter: Payer: Self-pay | Admitting: Medical

## 2015-06-22 VITALS — BP 118/80 | HR 94 | Temp 97.9°F | Resp 16 | Ht 66.0 in | Wt 118.0 lb

## 2015-06-22 DIAGNOSIS — R42 Dizziness and giddiness: Secondary | ICD-10-CM

## 2015-06-22 LAB — COMPREHENSIVE METABOLIC PANEL
ALT: 14 U/L (ref 0–35)
AST: 20 U/L (ref 0–37)
Albumin: 4.3 g/dL (ref 3.5–5.2)
Alkaline Phosphatase: 85 U/L (ref 39–117)
BUN: 12 mg/dL (ref 6–23)
CO2: 30 mEq/L (ref 19–32)
Calcium: 9.5 mg/dL (ref 8.4–10.5)
Chloride: 106 mEq/L (ref 96–112)
Creatinine, Ser: 0.65 mg/dL (ref 0.40–1.20)
GFR: 95.53 mL/min (ref >60.00)
Glucose, Bld: 92 mg/dL (ref 70–99)
Potassium: 4.2 mEq/L (ref 3.5–5.1)
Sodium: 141 mEq/L (ref 135–145)
Total Bilirubin: 0.7 mg/dL (ref 0.2–1.2)
Total Protein: 7.2 g/dL (ref 6.0–8.3)

## 2015-06-22 LAB — CBC WITH DIFFERENTIAL/PLATELET
Basophils Absolute: 0 10*3/uL (ref 0.0–0.1)
Basophils Relative: 0.5 % (ref 0.0–3.0)
Eosinophils Absolute: 0.2 10*3/uL (ref 0.0–0.7)
Eosinophils Relative: 4.4 % (ref 0.0–5.0)
HCT: 41.5 % (ref 36.0–46.0)
Hemoglobin: 13.8 g/dL (ref 12.0–15.0)
Lymphocytes Relative: 34.4 % (ref 12.0–46.0)
Lymphs Abs: 1.7 10*3/uL (ref 0.7–4.0)
MCHC: 33.2 g/dL (ref 30.0–36.0)
MCV: 92.1 fl (ref 78.0–100.0)
Monocytes Absolute: 0.4 10*3/uL (ref 0.1–1.0)
Monocytes Relative: 8 % (ref 3.0–12.0)
Neutro Abs: 2.6 10*3/uL (ref 1.4–7.7)
Neutrophils Relative %: 52.7 % (ref 43.0–77.0)
Platelets: 187 10*3/uL (ref 150.0–400.0)
RBC: 4.5 Mil/uL (ref 3.87–5.11)
RDW: 13.7 % (ref 11.5–15.5)
WBC: 4.9 10*3/uL (ref 4.0–10.5)

## 2015-06-22 MED ORDER — MECLIZINE HCL 12.5 MG PO TABS: 12.5000 mg | ORAL_TABLET | Freq: Three times a day (TID) | ORAL | Status: DC | PRN

## 2015-06-22 NOTE — Progress Notes (Signed)
Pre visit review using our clinic review tool, if applicable. No additional management support is needed unless otherwise documented below in the visit note. 

## 2015-06-22 NOTE — Telephone Encounter (Signed)
Patient has an appointment this morning 06/22/15.

## 2015-06-22 NOTE — Progress Notes (Signed)
   Subjective:    Patient ID: [name], female    DOB: 1945/11/08, 69 y.o.   MRN: [id number]  HPI   Pt in not dizzy presently. She has been intermittently dizzy. Pt states upcoming surgery for her neck(date undetermined).She had neck surgery one year ago.   Pt has been in a lot of pain. Pt states has been on 4 different type of drugs. Pt currently on liquid oxycodone.   Pt thinks she may have vertigo. She has history of this. Pt described dizziness as mild moderate 2 days ago. She layed down got up 2 hours later and was resolved. She states faint history of intermittent dizziness in the past. Pt states last 2 wks only 2 events of dizziness. Room is not spinning. No gross motor or sensory function deficits. When she changes positions no dizziness. No gross motor or sensory function deficits.  Pt does states naroctics in past made her feel nausea or oversedated.      Review of Systems  Constitutional: Negative for fever, chills and fatigue.  Eyes: Negative for photophobia and visual disturbance.  Respiratory: Negative for cough, choking, shortness of breath and wheezing.   Cardiovascular: Negative for chest pain and palpitations.  Gastrointestinal: Negative for nausea, vomiting and abdominal pain.  Musculoskeletal: Positive for neck pain. Negative for back pain.       Hx of chronic neck pain.  Skin: Negative for rash.  Neurological: Positive for dizziness. Negative for tremors, seizures, syncope, facial asymmetry, speech difficulty, numbness and headaches.       Not now but see hpi.  Hematological: Negative for adenopathy. Does not bruise/bleed easily.  Psychiatric/Behavioral: Negative for behavioral problems and confusion.       Objective:   Physical Exam  General Mental Status- Alert. General Appearance- Not in acute distress.   Skin General: Color- Normal Color. Moisture- Normal Moisture.  Neck Carotid Arteries- Normal color. Moisture- Normal Moisture. No carotid  bruits. No JVD. Sx changes to neck.  Chest and Lung Exam Auscultation: Breath Sounds:-Normal.  Cardiovascular Auscultation:Rythm- Regular. Murmurs & Other Heart Sounds:Auscultation of the heart reveals- No Murmurs.  Abdomen Inspection:-Inspeection Normal. Palpation/Percussion:Note:No mass. Palpation and Percussion of the abdomen reveal- Non Tender, Non Distended + BS, no rebound or guarding.    Neurologic Cranial Nerve exam:- CN III-XII intact(No nystagmus), symmetric smile.  Finger to Nose:- Normal/Intact Strength:- 5/5 equal and symmetric strength both upper and lower extremities.  Supine to seated position no dizzinees(no symptoms today)      Assessment & Plan:  Your dizziness is rare and sounds transient. May be associated to pain medication. You are concerned for other causes and we can get a cbc and cmp today.  I want you to call pain MD and see if they think med causing dizziness. See if they make adjustment.  I will rx dramamine tablet for dizziness. Use if dizziness that last more than 5 minutes. If dizziness with neurologic signs or symptoms as discussed then ED evaluation.

## 2015-06-22 NOTE — Patient Instructions (Addendum)
Your dizziness is rare and sounds transient. May be associated to pain medication. You are concerned for other causes and we can get a cbc and cmp today.  I want you to call pain MD and see if they think med causing dizziness. See if they make adjustment.  I will rx dramamine tablet for dizziness. Use if dizziness that last more than 5 minutes. If dizziness with neurologic signs or symptoms as discussed then ED evaluation.  Follow up in 7 days or as needed

## 2015-06-25 ENCOUNTER — Other Ambulatory Visit: Payer: Self-pay | Admitting: Family Medicine

## 2015-06-25 ENCOUNTER — Telehealth: Payer: Self-pay | Admitting: Family Medicine

## 2015-06-25 NOTE — Telephone Encounter (Signed)
Medication filled to pharmacy as requested.   

## 2015-06-25 NOTE — Telephone Encounter (Signed)
Relation to [id number] Call back number:[phone] Pharmacy: -[facility], [place] [phone] (Phone) [phone] (Fax)         Reason for call:  Patient inquiring if PCP would like her to continue to taking carvedilol (COREG) 3.125 MG tablet.patient states pharmcy informed patient medication is in need of PA

## 2015-06-26 ENCOUNTER — Telehealth: Payer: Self-pay | Admitting: Family Medicine

## 2015-06-26 DIAGNOSIS — R300 Dysuria: Secondary | ICD-10-CM

## 2015-06-26 NOTE — Telephone Encounter (Signed)
Caller name: Self   Can be reached: [phone]  Pharmacy:   [facility] [place], [place] [phone] (Phone) [phone] (Fax)         Reason for call: Patient thinks she may have a UTI and would like to go to the lab for a Urinalysis if possible.

## 2015-06-27 ENCOUNTER — Other Ambulatory Visit (INDEPENDENT_AMBULATORY_CARE_PROVIDER_SITE_OTHER): Payer: Medicare Other

## 2015-06-27 ENCOUNTER — Telehealth: Payer: Self-pay | Admitting: Family Medicine

## 2015-06-27 DIAGNOSIS — R8290 Unspecified abnormal findings in urine: Secondary | ICD-10-CM

## 2015-06-27 DIAGNOSIS — R8299 Other abnormal findings in urine: Secondary | ICD-10-CM | POA: Diagnosis not present

## 2015-06-27 LAB — POCT URINALYSIS DIPSTICK
Bilirubin, UA: NEGATIVE
Blood, UA: NEGATIVE
Glucose, UA: NEGATIVE
Ketones, UA: NEGATIVE
Nitrite, UA: NEGATIVE
Protein, UA: NEGATIVE
Spec Grav, UA: 1.025
Urobilinogen, UA: 0.2
pH, UA: 6

## 2015-06-27 NOTE — Telephone Encounter (Signed)
Ok for lab appt for UA and reflex culture

## 2015-06-27 NOTE — Telephone Encounter (Signed)
Pt's urine showed trace leuks but no obvious infection.  Will await culture prior to starting abx to make sure that treatment is warranted.

## 2015-06-27 NOTE — Telephone Encounter (Signed)
Caller name:Kirst, Mikaili J Relation to [id number] Call back number:[phone]   Reason for call:  Patient inquiring about urine results, patient worried about results and not having medication prior to the holiday, please advise

## 2015-06-27 NOTE — Telephone Encounter (Signed)
Ok for lab appt.  

## 2015-06-27 NOTE — Telephone Encounter (Signed)
Called and gave pt PCP recommendations. She stated an understanding.

## 2015-06-27 NOTE — Telephone Encounter (Signed)
Have you received the results?

## 2015-06-27 NOTE — Telephone Encounter (Signed)
Orders placed. Can you call and schedule pt a lab appt

## 2015-06-28 LAB — URINE CULTURE
Colony Count: NO GROWTH
Organism ID, Bacteria: NO GROWTH

## 2015-07-06 ENCOUNTER — Encounter: Payer: Medicare Other | Admitting: Family Medicine

## 2015-07-12 DIAGNOSIS — Z79891 Long term (current) use of opiate analgesic: Secondary | ICD-10-CM | POA: Diagnosis not present

## 2015-07-12 DIAGNOSIS — M961 Postlaminectomy syndrome, not elsewhere classified: Secondary | ICD-10-CM | POA: Diagnosis not present

## 2015-07-12 DIAGNOSIS — G894 Chronic pain syndrome: Secondary | ICD-10-CM | POA: Diagnosis not present

## 2015-07-12 DIAGNOSIS — M4693 Unspecified inflammatory spondylopathy, cervicothoracic region: Secondary | ICD-10-CM | POA: Diagnosis not present

## 2015-07-24 DIAGNOSIS — M47816 Spondylosis without myelopathy or radiculopathy, lumbar region: Secondary | ICD-10-CM | POA: Diagnosis not present

## 2015-07-24 DIAGNOSIS — M960 Pseudarthrosis after fusion or arthrodesis: Secondary | ICD-10-CM | POA: Diagnosis not present

## 2015-07-24 DIAGNOSIS — M47814 Spondylosis without myelopathy or radiculopathy, thoracic region: Secondary | ICD-10-CM | POA: Diagnosis not present

## 2015-07-24 DIAGNOSIS — M47812 Spondylosis without myelopathy or radiculopathy, cervical region: Secondary | ICD-10-CM | POA: Diagnosis not present

## 2015-07-24 DIAGNOSIS — Z981 Arthrodesis status: Secondary | ICD-10-CM | POA: Diagnosis not present

## 2015-07-25 ENCOUNTER — Ambulatory Visit (INDEPENDENT_AMBULATORY_CARE_PROVIDER_SITE_OTHER): Payer: Medicare Other | Admitting: Gynecology

## 2015-07-25 ENCOUNTER — Encounter: Payer: Self-pay | Admitting: Gynecology

## 2015-07-25 VITALS — BP 136/78 | Ht 66.0 in | Wt 118.0 lb

## 2015-07-25 DIAGNOSIS — M8580 Other specified disorders of bone density and structure, unspecified site: Secondary | ICD-10-CM | POA: Diagnosis not present

## 2015-07-25 DIAGNOSIS — N952 Postmenopausal atrophic vaginitis: Secondary | ICD-10-CM

## 2015-07-25 DIAGNOSIS — Z01419 Encounter for gynecological examination (general) (routine) without abnormal findings: Secondary | ICD-10-CM | POA: Diagnosis not present

## 2015-07-25 DIAGNOSIS — M899 Disorder of bone, unspecified: Secondary | ICD-10-CM | POA: Diagnosis not present

## 2015-07-25 MED ORDER — IBANDRONATE SODIUM 150 MG PO TABS: 150.0000 mg | ORAL_TABLET | ORAL | Status: DC

## 2015-07-25 NOTE — Progress Notes (Signed)
[name] [birth year] [id number]   History:    70 y.o.  for annual gyn exam with past history of spinal fusion as well as placement of [name] rods it is in the processing of having further neck surgery the first week of February. Her PCP is Dr. [name] who is been doing her blood work. Patient does have history of osteopenia when her bone density study from 2015 were reviewed with the patient her lowest T score was at the left femoral neck with a value of -1.5 with a 10 year hip fracture risk of 3.8%. She had been asked to return for blood work in consultation and has not done so. Patient with no previous history of any abnormal Pap smear. She had a mammogram and ultrasound in 2016 which was normal.Patient with normal colonoscopy in 2010. 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 She states it has been over 20 years since she had an abnormal Pap smear. Over 20 years ago she had cryotherapy of her cervix. 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 She has been followed by urologist Dr. Lawrence Santiago at [facility] for her interstitial cystitis which she sees once a year.she also being followed by Dr. Ubaldo Glassing dermatologist twice a year because her past history of melanoma.   Past medical history,surgical history, family history and social history were all reviewed and documented in the EPIC chart.  Gynecologic History No LMP recorded. Patient has had a hysterectomy. Contraception: status post hysterectomy Last Pap: 2012. Results were: normal Last mammogram: 2016. Results were: normal  Obstetric History OB History  Gravida Para Term Preterm AB SAB TAB Ectopic Multiple Living  1 1 1       1     # Outcome Date GA Lbr Len/2nd Weight Sex Delivery Anes PTL Lv  1 Term     M Vag-Spont  N Y       ROS: A ROS was performed and pertinent positives and negatives are included in the history.  GENERAL: No fevers or chills. HEENT: No change in vision, no  earache, sore throat or sinus congestion. NECK: No pain or stiffness. CARDIOVASCULAR: No chest pain or pressure. No palpitations. PULMONARY: No shortness of breath, cough or wheeze. GASTROINTESTINAL: No abdominal pain, nausea, vomiting or diarrhea, melena or bright red blood per rectum. GENITOURINARY: No urinary frequency, urgency, hesitancy or dysuria. MUSCULOSKELETAL: No joint or muscle pain, no back pain, no recent trauma. DERMATOLOGIC: No rash, no itching, no lesions. ENDOCRINE: No polyuria, polydipsia, no heat or cold intolerance. No recent change in weight. HEMATOLOGICAL: No anemia or easy bruising or bleeding. NEUROLOGIC: No headache, seizures, numbness, tingling or weakness. PSYCHIATRIC: No depression, no loss of interest in normal activity or change in sleep pattern.     Exam: chaperone present  BP 136/78 mmHg  Ht 5\' 6"  (1.676 m)  Wt 118 lb (53.524 kg)  BMI 19.05 kg/m2  Body mass index is 19.05 kg/(m^2).  General appearance : Well developed well nourished female. No acute distress HEENT: Eyes: no retinal hemorrhage or exudates,  Neck supple, trachea midline, no carotid bruits, no thyroidmegaly Lungs: Clear to auscultation, no rhonchi or wheezes, or rib retractions  Heart: Regular rate and rhythm, no murmurs or gallops Breast:Examined in sitting and supine position were symmetrical in appearance, no palpable masses or tenderness,  no skin retraction, no nipple inversion, no nipple discharge, no skin discoloration, no axillary or supraclavicular lymphadenopathy Abdomen: no palpable  masses or tenderness, no rebound or guarding Extremities: no edema or skin discoloration or tenderness  Pelvic:  Bartholin, Urethra, Skene Glands: Within normal limits             Vagina: No gross lesions or discharge, atrophic changes  Cervix: absent  Uterus absent  Adnexa  Without masses or tenderness  Anus and perineum  normal   Rectovaginal  normal sphincter tone without palpated masses or  tenderness             Hemoccult PCP provides     Assessment/Plan:  70 y.o. female for annual exam with evidence of osteopenia increase fracture risk at the hip based on Frax analysis. Her AP spine bone density T score was -2.4. I am concerned of not starting her on any medication until her next bone density study which is due in October. We are going to start her on Boniva which we'll work in the spine and then wait to see the results of the bone density study in October this year. If she gets into the osteoporotic range with in place her on IV Reclast or Prolia  To better address as well as her hips. 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 We are going to check her vitamin D level. Her recent calcium level was normal. Pap smear not done today. All her vaccines are up-to-date.   Terrance Mass MD, 12:51 PM 07/25/2015

## 2015-07-25 NOTE — Patient Instructions (Addendum)
Zoledronic Acid injection (Paget's Disease, Osteoporosis) What is this medicine? ZOLEDRONIC ACID (ZOE le dron ik AS id) lowers the amount of calcium loss from bone. It is used to treat Paget's disease and osteoporosis in women. This medicine may be used for other purposes; ask your health care provider or pharmacist if you have questions. What should I tell my health care provider before I take this medicine? They need to know if you have any of these conditions: -aspirin-sensitive asthma -cancer, especially if you are receiving medicines used to treat cancer -dental disease or wear dentures -infection -kidney disease -low levels of calcium in the blood -past surgery on the parathyroid gland or intestines -receiving corticosteroids like dexamethasone or prednisone -an unusual or allergic reaction to zoledronic acid, other medicines, foods, dyes, or preservatives -pregnant or trying to get pregnant -breast-feeding How should I use this medicine? This medicine is for infusion into a vein. It is given by a health care professional in a hospital or clinic setting. Talk to your pediatrician regarding the use of this medicine in children. This medicine is not approved for use in children. Overdosage: If you think you have taken too much of this medicine contact a poison control center or emergency room at once. NOTE: This medicine is only for you. Do not share this medicine with others. What if I miss a dose? It is important not to miss your dose. Call your doctor or health care professional if you are unable to keep an appointment. What may interact with this medicine? -certain antibiotics given by injection -NSAIDs, medicines for pain and inflammation, like ibuprofen or naproxen -some diuretics like bumetanide, furosemide -teriparatide This list may not describe all possible interactions. Give your health care provider a list of all the medicines, herbs, non-prescription drugs, or dietary  supplements you use. Also tell them if you smoke, drink alcohol, or use illegal drugs. Some items may interact with your medicine. What should I watch for while using this medicine? Visit your doctor or health care professional for regular checkups. It may be some time before you see the benefit from this medicine. Do not stop taking your medicine unless your doctor tells you to. Your doctor may order blood tests or other tests to see how you are doing. Women should inform their doctor if they wish to become pregnant or think they might be pregnant. There is a potential for serious side effects to an unborn child. Talk to your health care professional or pharmacist for more information. You should make sure that you get enough calcium and vitamin D while you are taking this medicine. Discuss the foods you eat and the vitamins you take with your health care professional. Some people who take this medicine have severe bone, joint, and/or muscle pain. This medicine may also increase your risk for jaw problems or a broken thigh bone. Tell your doctor right away if you have severe pain in your jaw, bones, joints, or muscles. Tell your doctor if you have any pain that does not go away or that gets worse. Tell your dentist and dental surgeon that you are taking this medicine. You should not have major dental surgery while on this medicine. See your dentist to have a dental exam and fix any dental problems before starting this medicine. Take good care of your teeth while on this medicine. Make sure you see your dentist for regular follow-up appointments. What side effects may I notice from receiving this medicine? Side effects that you should  report to your doctor or health care professional as soon as possible: -allergic reactions like skin rash, itching or hives, swelling of the face, lips, or tongue -anxiety, confusion, or depression -breathing problems -changes in vision -eye pain -feeling faint or  lightheaded, falls -jaw pain, especially after dental work -mouth sores -muscle cramps, stiffness, or weakness -redness, blistering, peeling or loosening of the skin, including inside the mouth -trouble passing urine or change in the amount of urine Side effects that usually do not require medical attention (report to your doctor or health care professional if they continue or are bothersome): -bone, joint, or muscle pain -constipation -diarrhea -fever -hair loss -irritation at site where injected -loss of appetite -nausea, vomiting -stomach upset -trouble sleeping -trouble swallowing -weak or tired This list may not describe all possible side effects. Call your doctor for medical advice about side effects. You may report side effects to FDA at 1-800-FDA-1088. Where should I keep my medicine? This drug is given in a hospital or clinic and will not be stored at home. NOTE: This sheet is a summary. It may not cover all possible information. If you have questions about this medicine, talk to your doctor, pharmacist, or health care provider.    2016, Elsevier/Gold Standard. (2013-11-19 14:19:57) Denosumab injection What is this medicine? DENOSUMAB (den oh sue mab) slows bone breakdown. Prolia is used to treat osteoporosis in women after menopause and in men. Delton See is used to prevent bone fractures and other bone problems caused by cancer bone metastases. Delton See is also used to treat giant cell tumor of the bone. This medicine may be used for other purposes; ask your health care provider or pharmacist if you have questions. What should I tell my health care provider before I take this medicine? They need to know if you have any of these conditions: -dental disease -eczema -infection or history of infections -kidney disease or on dialysis -low blood calcium or vitamin D -malabsorption syndrome -scheduled to have surgery or tooth extraction -taking medicine that contains  denosumab -thyroid or parathyroid disease -an unusual reaction to denosumab, other medicines, foods, dyes, or preservatives -pregnant or trying to get pregnant -breast-feeding How should I use this medicine? This medicine is for injection under the skin. It is given by a health care professional in a hospital or clinic setting. If you are getting Prolia, a special MedGuide will be given to you by the pharmacist with each prescription and refill. Be sure to read this information carefully each time. For Prolia, talk to your pediatrician regarding the use of this medicine in children. Special care may be needed. For Delton See, talk to your pediatrician regarding the use of this medicine in children. While this drug may be prescribed for children as young as 13 years for selected conditions, precautions do apply. Overdosage: If you think you have taken too much of this medicine contact a poison control center or emergency room at once. NOTE: This medicine is only for you. Do not share this medicine with others. What if I miss a dose? It is important not to miss your dose. Call your doctor or health care professional if you are unable to keep an appointment. What may interact with this medicine? Do not take this medicine with any of the following medications: -other medicines containing denosumab This medicine may also interact with the following medications: -medicines that suppress the immune system -medicines that treat cancer -steroid medicines like prednisone or cortisone This list may not describe all possible interactions.  Give your health care provider a list of all the medicines, herbs, non-prescription drugs, or dietary supplements you use. Also tell them if you smoke, drink alcohol, or use illegal drugs. Some items may interact with your medicine. What should I watch for while using this medicine? Visit your doctor or health care professional for regular checks on your progress. Your doctor  or health care professional may order blood tests and other tests to see how you are doing. Call your doctor or health care professional if you get a cold or other infection while receiving this medicine. Do not treat yourself. This medicine may decrease your body's ability to fight infection. You should make sure you get enough calcium and vitamin D while you are taking this medicine, unless your doctor tells you not to. Discuss the foods you eat and the vitamins you take with your health care professional. See your dentist regularly. Brush and floss your teeth as directed. Before you have any dental work done, tell your dentist you are receiving this medicine. Do not become pregnant while taking this medicine or for 5 months after stopping it. Women should inform their doctor if they wish to become pregnant or think they might be pregnant. There is a potential for serious side effects to an unborn child. Talk to your health care professional or pharmacist for more information. What side effects may I notice from receiving this medicine? Side effects that you should report to your doctor or health care professional as soon as possible: -allergic reactions like skin rash, itching or hives, swelling of the face, lips, or tongue -breathing problems -chest pain -fast, irregular heartbeat -feeling faint or lightheaded, falls -fever, chills, or any other sign of infection -muscle spasms, tightening, or twitches -numbness or tingling -skin blisters or bumps, or is dry, peels, or red -slow healing or unexplained pain in the mouth or jaw -unusual bleeding or bruising Side effects that usually do not require medical attention (Report these to your doctor or health care professional if they continue or are bothersome.): -muscle pain -stomach upset, gas This list may not describe all possible side effects. Call your doctor for medical advice about side effects. You may report side effects to FDA at  1-800-FDA-1088. Where should I keep my medicine? This medicine is only given in a clinic, doctor's office, or other health care setting and will not be stored at home. NOTE: This sheet is a summary. It may not cover all possible information. If you have questions about this medicine, talk to your doctor, pharmacist, or health care provider.    2016, Elsevier/Gold Standard. (2011-12-22 12:37:47) Ibandronate injection What is this medicine? IBANDRONATE (i BAN droh nate) slows calcium loss from bones. It is used to treat osteoporosis in women past the age of menopause. This medicine may be used for other purposes; ask your health care provider or pharmacist if you have questions. What should I tell my health care provider before I take this medicine? They need to know if you have any of these conditions: -dental disease -kidney disease -low levels of calcium in the blood -low levels of vitamin D in the blood -an unusual or allergic reaction to ibandronate, other medicines, foods, dyes, or preservatives -pregnant or trying to get pregnant -breast-feeding How should I use this medicine? This medicine is for injection into a vein. It is given by a health care professional in a hospital or clinic setting. Talk to your pediatrician regarding the use of this medicine in children.  Special care may be needed. Overdosage: If you think you have taken too much of this medicine contact a poison control center or emergency room at once. NOTE: This medicine is only for you. Do not share this medicine with others. What if I miss a dose? It is important not to miss your dose. Call your doctor or health care professional if you are unable to keep an appointment. What may interact with this medicine? -teriparatide This list may not describe all possible interactions. Give your health care provider a list of all the medicines, herbs, non-prescription drugs, or dietary supplements you use. Also tell them if  you smoke, drink alcohol, or use illegal drugs. Some items may interact with your medicine. What should I watch for while using this medicine? Visit your doctor or health care professional for regular check ups. It may be some time before you see the benefit from this medicine. Do not stop taking your medicine except on your doctor's advice. Your doctor or health care professional may order blood tests and other tests to see how you are doing. You should make sure you get enough calcium and vitamin D while you are taking this medicine, unless your doctor tells you not to. Discuss the foods you eat and the vitamins you take with your health care professional. Some people who take this medicine have severe bone, joint, and/or muscle pain. This medicine may also increase your risk for a broken thigh bone. Tell your doctor right away if you have pain in your upper leg or groin. Tell your doctor if you have any pain that does not go away or that gets worse. What side effects may I notice from receiving this medicine? Side effects that you should report to your doctor or health care professional as soon as possible: -allergic reactions such as skin rash or itching, hives, swelling of the face, lips, throat, or tongue -changes in vision -chest pain -fever, flu-like symptoms -heartburn or stomach pain -jaw pain, especially after dental work Side effects that usually do not require medical attention (report to your doctor or health care professional if they continue or are bothersome): -bone, muscle or joint pain -diarrhea or constipation -eye pain or itching -headache -irritation at site where injected -nausea This list may not describe all possible side effects. Call your doctor for medical advice about side effects. You may report side effects to FDA at 1-800-FDA-1088. Where should I keep my medicine? This drug is given in a hospital or clinic and will not be stored at home. NOTE: This sheet is a  summary. It may not cover all possible information. If you have questions about this medicine, talk to your doctor, pharmacist, or health care provider.    2016, Elsevier/Gold Standard. (2010-12-20 09:02:20)

## 2015-07-26 ENCOUNTER — Other Ambulatory Visit: Payer: Self-pay | Admitting: Gynecology

## 2015-07-26 DIAGNOSIS — E559 Vitamin D deficiency, unspecified: Secondary | ICD-10-CM

## 2015-07-26 LAB — VITAMIN D 25 HYDROXY (VIT D DEFICIENCY, FRACTURES): Vit D, 25-Hydroxy: 27 ng/mL — ABNORMAL LOW (ref 30–100)

## 2015-07-26 MED ORDER — VITAMIN D (ERGOCALCIFEROL) 1.25 MG (50000 UNIT) PO CAPS: 50000.0000 [IU] | ORAL_CAPSULE | ORAL | Status: DC

## 2015-08-07 ENCOUNTER — Telehealth: Payer: Self-pay | Admitting: *Deleted

## 2015-08-07 NOTE — Telephone Encounter (Signed)
Pt called to confirm she was taking the correct amount of Vitamin D 5000 units, pt was informed yes for 12 weeks and then after she will start with Vitamin D3 2000 units

## 2015-08-08 DIAGNOSIS — T84226A Displacement of internal fixation device of vertebrae, initial encounter: Secondary | ICD-10-CM | POA: Diagnosis not present

## 2015-08-08 DIAGNOSIS — R9431 Abnormal electrocardiogram [ECG] [EKG]: Secondary | ICD-10-CM | POA: Diagnosis not present

## 2015-08-08 DIAGNOSIS — G4700 Insomnia, unspecified: Secondary | ICD-10-CM | POA: Diagnosis present

## 2015-08-08 DIAGNOSIS — M545 Low back pain: Secondary | ICD-10-CM | POA: Diagnosis present

## 2015-08-08 DIAGNOSIS — M960 Pseudarthrosis after fusion or arthrodesis: Secondary | ICD-10-CM | POA: Diagnosis not present

## 2015-08-08 DIAGNOSIS — K219 Gastro-esophageal reflux disease without esophagitis: Secondary | ICD-10-CM | POA: Diagnosis present

## 2015-08-08 DIAGNOSIS — Z7982 Long term (current) use of aspirin: Secondary | ICD-10-CM | POA: Diagnosis not present

## 2015-08-08 DIAGNOSIS — G8929 Other chronic pain: Secondary | ICD-10-CM | POA: Diagnosis present

## 2015-08-08 DIAGNOSIS — Z79899 Other long term (current) drug therapy: Secondary | ICD-10-CM | POA: Diagnosis not present

## 2015-08-08 DIAGNOSIS — Z882 Allergy status to sulfonamides status: Secondary | ICD-10-CM | POA: Diagnosis not present

## 2015-08-08 DIAGNOSIS — T8484XA Pain due to internal orthopedic prosthetic devices, implants and grafts, initial encounter: Secondary | ICD-10-CM | POA: Diagnosis not present

## 2015-08-08 DIAGNOSIS — E559 Vitamin D deficiency, unspecified: Secondary | ICD-10-CM | POA: Diagnosis present

## 2015-08-08 DIAGNOSIS — I4891 Unspecified atrial fibrillation: Secondary | ICD-10-CM | POA: Diagnosis not present

## 2015-08-08 DIAGNOSIS — Z881 Allergy status to other antibiotic agents status: Secondary | ICD-10-CM | POA: Diagnosis not present

## 2015-08-08 DIAGNOSIS — I480 Paroxysmal atrial fibrillation: Secondary | ICD-10-CM | POA: Diagnosis present

## 2015-08-08 DIAGNOSIS — Z888 Allergy status to other drugs, medicaments and biological substances status: Secondary | ICD-10-CM | POA: Diagnosis not present

## 2015-08-08 DIAGNOSIS — Z91040 Latex allergy status: Secondary | ICD-10-CM | POA: Diagnosis not present

## 2015-08-08 DIAGNOSIS — Z981 Arthrodesis status: Secondary | ICD-10-CM | POA: Diagnosis not present

## 2015-08-08 DIAGNOSIS — Z87891 Personal history of nicotine dependence: Secondary | ICD-10-CM | POA: Diagnosis not present

## 2015-08-08 DIAGNOSIS — G609 Hereditary and idiopathic neuropathy, unspecified: Secondary | ICD-10-CM | POA: Diagnosis present

## 2015-08-08 DIAGNOSIS — F411 Generalized anxiety disorder: Secondary | ICD-10-CM | POA: Diagnosis present

## 2015-08-08 DIAGNOSIS — Z91041 Radiographic dye allergy status: Secondary | ICD-10-CM | POA: Diagnosis not present

## 2015-08-08 DIAGNOSIS — Z79891 Long term (current) use of opiate analgesic: Secondary | ICD-10-CM | POA: Diagnosis not present

## 2015-08-08 DIAGNOSIS — I10 Essential (primary) hypertension: Secondary | ICD-10-CM | POA: Diagnosis not present

## 2015-08-08 DIAGNOSIS — Z472 Encounter for removal of internal fixation device: Secondary | ICD-10-CM | POA: Diagnosis not present

## 2015-08-15 ENCOUNTER — Telehealth: Payer: Self-pay | Admitting: Family Medicine

## 2015-08-15 NOTE — Telephone Encounter (Signed)
Pt says that she had a surgery on Feb 1. She have swollen ankles. Pt says that PCP prescribed  pt something for fluid. (Lacet, pt thinks) she would like to know if pcp could prescribe or if an appt is needed?   CB: [phone]

## 2015-08-15 NOTE — Telephone Encounter (Signed)
Admitted: 08/08/15 Discharged:  08/12/15  [facility] follow up scheduled Friday, 08/17/15 @ 11 am with Dr. [name].    Transition Care Management Follow-up Telephone Call  How have you been since you were released from the hospital? Pt states she has been in a lot of pain since surgery, pain medication has been helpful with managing pain.  Self-managing drains.  Son there to assist as needed.  Denies chest pain,shortness of breath, increased heart rate, n/v, abdominal pain, fever, chills, confusion, lightheadedness/dizziness.  BM: regular.    Only concern:  Swelling in ankles and feet.     Do you understand why you were in the hospital? yes   Do you understand the discharge instructions? yes  Items Reviewed:  Medications reviewed: yes  Allergies reviewed: yes  Dietary changes reviewed: yes  Referrals reviewed: yes   Functional Questionnaire:   Activities of Daily Living (ADLs):   She states they are independent in the following: ambulation, bathing and hygiene, feeding, continence, grooming, toileting and dressing States they require assistance with the following: Son there to help as needed   Any transportation issues/concerns?: no, son will bringing patient to her appt.     Any patient concerns?  See above.     Confirmed importance and date/time of follow-up visits scheduled: no   Confirmed with patient if condition begins to worsen call PCP or go to the ER: yes

## 2015-08-15 NOTE — Telephone Encounter (Signed)
Spoke with patient regarding refill. Advised that Dr. [name] would probably need to see her for [facility] FU . Was not sure if you all had her on the list. So I advised her she would be getting a call back after I spoke with sup.

## 2015-08-17 ENCOUNTER — Ambulatory Visit (INDEPENDENT_AMBULATORY_CARE_PROVIDER_SITE_OTHER): Payer: Medicare Other | Admitting: Family Medicine

## 2015-08-17 ENCOUNTER — Encounter: Payer: Self-pay | Admitting: Family Medicine

## 2015-08-17 VITALS — BP 120/80 | HR 97 | Temp 98.4°F | Ht 66.0 in | Wt 124.0 lb

## 2015-08-17 DIAGNOSIS — R600 Localized edema: Secondary | ICD-10-CM | POA: Diagnosis not present

## 2015-08-17 DIAGNOSIS — Z981 Arthrodesis status: Secondary | ICD-10-CM

## 2015-08-17 LAB — BASIC METABOLIC PANEL
BUN: 9 mg/dL (ref 6–23)
CO2: 33 mEq/L — ABNORMAL HIGH (ref 19–32)
Calcium: 9.4 mg/dL (ref 8.4–10.5)
Chloride: 106 mEq/L (ref 96–112)
Creatinine, Ser: 0.59 mg/dL (ref 0.40–1.20)
GFR: 106.78 mL/min (ref >60.00)
Glucose, Bld: 94 mg/dL (ref 70–99)
Potassium: 4.1 mEq/L (ref 3.5–5.1)
Sodium: 143 mEq/L (ref 135–145)

## 2015-08-17 MED ORDER — FUROSEMIDE 20 MG PO TABS: 20.0000 mg | ORAL_TABLET | Freq: Every day | ORAL | Status: DC

## 2015-08-17 MED ORDER — CLONAZEPAM 0.5 MG PO TABS: 0.2500 mg | ORAL_TABLET | Freq: Two times a day (BID) | ORAL | Status: DC | PRN

## 2015-08-17 NOTE — Patient Instructions (Signed)
Follow up in 1-2 weeks to recheck swelling and lab results Start the lasix once daily to improve the swelling Drink plenty of water (which sounds counterintuitive when there is swelling!) Call with any questions or concerns Hang in there!  You look great!!!

## 2015-08-17 NOTE — Progress Notes (Signed)
   Subjective:    Patient ID: [name], female    DOB: [birth year], 70 y.o.   MRN: [id number]  HPI Hospital f/u- pt is here today w/ son after hospitalization 2/1-5 for neck surgery revision.  Pt has wound vac on R upper back- has f/u scheduled for 2/13.  Pt has 2 drains in place- she is managing her drains.  Pt has neck and shoulder pain.  Pt is complaining of LE edema bilaterally.  Pt is very worried about the swelling.  Denies SOB, CP.  No swelling of hands.   Review of Systems For ROS see HPI     Objective:   Physical Exam  Constitutional: She is oriented to person, place, and time. She appears well-developed and well-nourished. No distress.  HENT:  Head: Normocephalic and atraumatic.  Eyes: Conjunctivae and EOM are normal. Pupils are equal, round, and reactive to light.  Neck: No thyromegaly present.  Large midline incision w/ well healing sutures  Cardiovascular: Normal rate, regular rhythm, normal heart sounds and intact distal pulses.   No murmur heard. Pulmonary/Chest: Effort normal and breath sounds normal. No respiratory distress.  Abdominal: Soft. She exhibits no distension. There is no tenderness.  Musculoskeletal: She exhibits edema (1-2+ pitting edema of ankles and feet).  Lymphadenopathy:    She has no cervical adenopathy.  Neurological: She is alert and oriented to person, place, and time.  Skin: Skin is warm and dry.  Psychiatric: She has a normal mood and affect. Her behavior is normal.  Vitals reviewed.         Assessment & Plan:

## 2015-08-17 NOTE — Progress Notes (Signed)
Pre visit review using our clinic review tool, if applicable. No additional management support is needed unless otherwise documented below in the visit note. 

## 2015-08-19 NOTE — Assessment & Plan Note (Signed)
New.  Occuring in post-op setting.  Suspect that pt had a lot of IVF while hospitalized and due to her relative immobility she is having a difficult time diuresing.  No evidence of CHF on exam.  Start lasix.  Check BMP today.  Will follow.  Pt expressed understanding and is in agreement w/ plan.

## 2015-08-20 ENCOUNTER — Encounter: Payer: Self-pay | Admitting: General Practice

## 2015-08-23 ENCOUNTER — Encounter: Payer: Self-pay | Admitting: Family Medicine

## 2015-08-23 ENCOUNTER — Ambulatory Visit (INDEPENDENT_AMBULATORY_CARE_PROVIDER_SITE_OTHER): Payer: Medicare Other | Admitting: Family Medicine

## 2015-08-23 VITALS — BP 110/80 | HR 106 | Temp 97.9°F | Ht 66.0 in | Wt 120.6 lb

## 2015-08-23 DIAGNOSIS — H6120 Impacted cerumen, unspecified ear: Secondary | ICD-10-CM | POA: Insufficient documentation

## 2015-08-23 DIAGNOSIS — H6121 Impacted cerumen, right ear: Secondary | ICD-10-CM | POA: Diagnosis not present

## 2015-08-23 DIAGNOSIS — H918X1 Other specified hearing loss, right ear: Secondary | ICD-10-CM | POA: Diagnosis not present

## 2015-08-23 MED ORDER — FLUTICASONE PROPIONATE 50 MCG/ACT NA SUSP: 2.0000 | Freq: Every day | NASAL | Status: DC

## 2015-08-23 NOTE — Assessment & Plan Note (Signed)
New to provider, pt has hx of similar that needed ENT removal.  Cerumen successfully removed w/ curette in office today.  Pt tolerated w/o difficulty.  Hearing improved immediately.  Will add Flonase to pt's daily Claritin.  Pt expressed understanding and is in agreement w/ plan.

## 2015-08-23 NOTE — Progress Notes (Signed)
   Subjective:    Patient ID: [name], female    DOB: 29-Oct-1945, 70 y.o.   MRN: [id number]  HPI URI- pt reports she had decreased hearing in R ear, 'it feels like my head is underwater'.  Denies sneezing.  + runny nose.  'yesterday I felt real bad'.  No fever.  Denies sinus pain/pressure.  No cough.  + sick contacts.   Review of Systems For ROS see HPI     Objective:   Physical Exam  Constitutional: She appears well-developed and well-nourished. No distress.  HENT:  Head: Normocephalic and atraumatic.  Right Ear: Tympanic membrane normal.  Left Ear: Tympanic membrane normal.  Nose: Mucosal edema and rhinorrhea present. Right sinus exhibits no maxillary sinus tenderness and no frontal sinus tenderness. Left sinus exhibits no maxillary sinus tenderness and no frontal sinus tenderness.  Mouth/Throat: Mucous membranes are normal. Posterior oropharyngeal erythema (w/ PND) present.  R TM initially obscured by cerumen.  Successfully removed via curette.  Pt tolerated w/o difficulty.  Eyes: Conjunctivae and EOM are normal. Pupils are equal, round, and reactive to light.  Neck: Normal range of motion. Neck supple.  Cardiovascular: Normal rate, regular rhythm and normal heart sounds.   Pulmonary/Chest: Effort normal and breath sounds normal. No respiratory distress. She has no wheezes. She has no rales.  Lymphadenopathy:    She has no cervical adenopathy.  Vitals reviewed.         Assessment & Plan:

## 2015-08-23 NOTE — Patient Instructions (Signed)
Follow up as needed Continue the Loratadine daily for allergies Start the Flonase- 2 sprays each nostril daily Drink plenty of fluids Call with any questions or concerns Hang in there!!!

## 2015-08-23 NOTE — Progress Notes (Signed)
Pre visit review using our clinic review tool, if applicable. No additional management support is needed unless otherwise documented below in the visit note. 

## 2015-08-30 ENCOUNTER — Telehealth: Payer: Self-pay | Admitting: Family Medicine

## 2015-08-30 NOTE — Telephone Encounter (Signed)
Let message on patients answering machine that she does not have appointment scheduled until 10/2015.

## 2015-08-30 NOTE — Telephone Encounter (Signed)
Pt requesting a call back from [facility] directly. Pt has called in 3 days straight to confirm if she has an appt. Confirmed that pt doesn't have appt. Pt cancelled an appt due to no longer needing it on 2/21. But she would like to make sure that PCP doesn't still want her to come in instead because pt says that she keeps getting appt notifications. (Not showing that pt should be receiving notifications.)  Please FU. I will be happy to reschedule if needed.

## 2015-08-31 ENCOUNTER — Ambulatory Visit: Payer: Medicare Other | Admitting: Family Medicine

## 2015-09-10 DIAGNOSIS — M961 Postlaminectomy syndrome, not elsewhere classified: Secondary | ICD-10-CM | POA: Diagnosis not present

## 2015-09-10 DIAGNOSIS — Z79891 Long term (current) use of opiate analgesic: Secondary | ICD-10-CM | POA: Diagnosis not present

## 2015-09-10 DIAGNOSIS — M4693 Unspecified inflammatory spondylopathy, cervicothoracic region: Secondary | ICD-10-CM | POA: Diagnosis not present

## 2015-09-10 DIAGNOSIS — G894 Chronic pain syndrome: Secondary | ICD-10-CM | POA: Diagnosis not present

## 2015-09-24 ENCOUNTER — Ambulatory Visit: Payer: Medicare Other | Admitting: Family Medicine

## 2015-09-25 ENCOUNTER — Ambulatory Visit: Payer: Medicare Other | Admitting: Family Medicine

## 2015-09-27 ENCOUNTER — Telehealth: Payer: Self-pay | Admitting: Family Medicine

## 2015-09-27 MED ORDER — ONDANSETRON HCL 4 MG PO TABS: 4.0000 mg | ORAL_TABLET | Freq: Three times a day (TID) | ORAL | Status: DC | PRN

## 2015-09-27 NOTE — Telephone Encounter (Signed)
Medication filled to pharmacy as requested.   

## 2015-09-27 NOTE — Telephone Encounter (Signed)
Pt had spine surgery and is taking liquid oxycodone. It is making her nauseous. She is asking if Dr. [name] can call in something to help with the nausea today. She will still f/u with Dr. [name] on Monday. Pt phone# [phone].  Pharmacy: [facility] [place], [place]

## 2015-09-27 NOTE — Telephone Encounter (Signed)
Ok for Zofran 4mg  TID prn, #20

## 2015-10-01 ENCOUNTER — Ambulatory Visit (INDEPENDENT_AMBULATORY_CARE_PROVIDER_SITE_OTHER): Payer: Medicare Other | Admitting: Family Medicine

## 2015-10-01 ENCOUNTER — Encounter: Payer: Self-pay | Admitting: Family Medicine

## 2015-10-01 VITALS — BP 126/81 | HR 89 | Temp 98.0°F | Resp 16 | Ht 66.0 in | Wt 117.2 lb

## 2015-10-01 DIAGNOSIS — I480 Paroxysmal atrial fibrillation: Secondary | ICD-10-CM | POA: Diagnosis not present

## 2015-10-01 DIAGNOSIS — E538 Deficiency of other specified B group vitamins: Secondary | ICD-10-CM | POA: Insufficient documentation

## 2015-10-01 DIAGNOSIS — E785 Hyperlipidemia, unspecified: Secondary | ICD-10-CM

## 2015-10-01 LAB — LIPID PANEL
Cholesterol: 211 mg/dL — ABNORMAL HIGH (ref 0–200)
HDL: 95.1 mg/dL (ref >39.00)
LDL Cholesterol: 102 mg/dL — ABNORMAL HIGH (ref 0–99)
NonHDL: 115.55
Total CHOL/HDL Ratio: 2
Triglycerides: 67 mg/dL (ref 0.0–149.0)
VLDL: 13.4 mg/dL (ref 0.0–40.0)

## 2015-10-01 LAB — HEPATIC FUNCTION PANEL
ALT: 13 U/L (ref 0–35)
AST: 20 U/L (ref 0–37)
Albumin: 4.6 g/dL (ref 3.5–5.2)
Alkaline Phosphatase: 92 U/L (ref 39–117)
Bilirubin, Direct: 0.1 mg/dL (ref 0.0–0.3)
Total Bilirubin: 0.7 mg/dL (ref 0.2–1.2)
Total Protein: 7.6 g/dL (ref 6.0–8.3)

## 2015-10-01 LAB — BASIC METABOLIC PANEL
BUN: 15 mg/dL (ref 6–23)
CO2: 31 mEq/L (ref 19–32)
Calcium: 9.8 mg/dL (ref 8.4–10.5)
Chloride: 106 mEq/L (ref 96–112)
Creatinine, Ser: 0.67 mg/dL (ref 0.40–1.20)
GFR: 92.18 mL/min (ref >60.00)
Glucose, Bld: 96 mg/dL (ref 70–99)
Potassium: 4.5 mEq/L (ref 3.5–5.1)
Sodium: 143 mEq/L (ref 135–145)

## 2015-10-01 LAB — CBC WITH DIFFERENTIAL/PLATELET
Basophils Absolute: 0 10*3/uL (ref 0.0–0.1)
Basophils Relative: 0.6 % (ref 0.0–3.0)
Eosinophils Absolute: 0.2 10*3/uL (ref 0.0–0.7)
Eosinophils Relative: 3.6 % (ref 0.0–5.0)
HCT: 42.7 % (ref 36.0–46.0)
Hemoglobin: 14.1 g/dL (ref 12.0–15.0)
Lymphocytes Relative: 28.5 % (ref 12.0–46.0)
Lymphs Abs: 1.4 10*3/uL (ref 0.7–4.0)
MCHC: 32.9 g/dL (ref 30.0–36.0)
MCV: 90.2 fl (ref 78.0–100.0)
Monocytes Absolute: 0.3 10*3/uL (ref 0.1–1.0)
Monocytes Relative: 7.3 % (ref 3.0–12.0)
Neutro Abs: 2.9 10*3/uL (ref 1.4–7.7)
Neutrophils Relative %: 60 % (ref 43.0–77.0)
Platelets: 207 10*3/uL (ref 150.0–400.0)
RBC: 4.73 Mil/uL (ref 3.87–5.11)
RDW: 13.4 % (ref 11.5–15.5)
WBC: 4.8 10*3/uL (ref 4.0–10.5)

## 2015-10-01 LAB — TSH: TSH: 2.41 u[IU]/mL (ref 0.35–4.50)

## 2015-10-01 LAB — VITAMIN B12: Vitamin B-12: 275 pg/mL (ref 211–911)

## 2015-10-01 NOTE — Assessment & Plan Note (Signed)
Ongoing issue for pt.  Currently asymptomatic w/ exception of fatigue.  Rate controlled and regular today.  Check labs.  Will continue to follow along.

## 2015-10-01 NOTE — Assessment & Plan Note (Signed)
Chronic problem.  Not currently on statin- attempting to control w/ healthy diet.  Check labs.  Adjust tx plan prn.

## 2015-10-01 NOTE — Progress Notes (Signed)
   Subjective:    Patient ID: [name], female    DOB: October 20, 1945, 70 y.o.   MRN: [id number]  HPI Hyperlipidemia- chronic problem.  Attempting to control w/ healthy diet.  Not currently on medication.  Denies abd pain.  + N/V due to pain meds.  PAF- ongoing issue, followed by Dr Wynonia Lawman.  Currently on Coreg, Lasix, ASA.  Denies palpitations.  + fatigue.  No SOB  B12 deficiency- pt has hx of B12 injxns.  Has not been receiving these recently and wonders if a deficiency could be contributing to her fatigue   Review of Systems For ROS see HPI     Objective:   Physical Exam  Constitutional: She is oriented to person, place, and time. No distress.  Very thin, frail  HENT:  Head: Normocephalic and atraumatic.  Eyes: Conjunctivae and EOM are normal. Pupils are equal, round, and reactive to light.  Neck: Normal range of motion. Neck supple. No thyromegaly present.  Cardiovascular: Normal rate, regular rhythm, normal heart sounds and intact distal pulses.   No murmur heard. Pulmonary/Chest: Effort normal and breath sounds normal. No respiratory distress.  Abdominal: Soft. She exhibits no distension. There is no tenderness.  Musculoskeletal: She exhibits no edema.  Lymphadenopathy:    She has no cervical adenopathy.  Neurological: She is alert and oriented to person, place, and time.  Skin: Skin is warm and dry.  Psychiatric: She has a normal mood and affect. Her behavior is normal.  Vitals reviewed.         Assessment & Plan:

## 2015-10-01 NOTE — Progress Notes (Signed)
Pre visit review using our clinic review tool, if applicable. No additional management support is needed unless otherwise documented below in the visit note. 

## 2015-10-01 NOTE — Assessment & Plan Note (Signed)
New.  Pt reports she was previously getting B12 injxns and is wondering if she needs to resume these.  Will check labs and restart injxns prn.  Pt expressed understanding and is in agreement w/ plan.

## 2015-10-01 NOTE — Patient Instructions (Signed)
Schedule your complete physical in 6 months We'll notify you of your lab results and make any changes if needed Continue to eat regularly- you need your strength! Call with any questions or concerns [place]!!!

## 2015-10-02 ENCOUNTER — Encounter: Payer: Self-pay | Admitting: General Practice

## 2015-10-05 ENCOUNTER — Telehealth: Payer: Self-pay | Admitting: Family Medicine

## 2015-10-05 NOTE — Telephone Encounter (Signed)
Called patient with lab results. Advised letter also mailed.

## 2015-10-05 NOTE — Telephone Encounter (Signed)
Pt calling for lab results.

## 2015-10-09 DIAGNOSIS — N301 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: Secondary | ICD-10-CM | POA: Diagnosis not present

## 2015-10-18 ENCOUNTER — Ambulatory Visit: Payer: Medicare Other | Admitting: Family Medicine

## 2015-10-22 ENCOUNTER — Other Ambulatory Visit: Payer: Self-pay | Admitting: Family Medicine

## 2015-10-22 ENCOUNTER — Ambulatory Visit: Payer: Medicare Other | Admitting: Family Medicine

## 2015-10-22 NOTE — Telephone Encounter (Signed)
Medication filled to pharmacy as requested.   

## 2015-10-25 DIAGNOSIS — Z87891 Personal history of nicotine dependence: Secondary | ICD-10-CM | POA: Diagnosis not present

## 2015-10-25 DIAGNOSIS — Z881 Allergy status to other antibiotic agents status: Secondary | ICD-10-CM | POA: Diagnosis not present

## 2015-10-25 DIAGNOSIS — Z882 Allergy status to sulfonamides status: Secondary | ICD-10-CM | POA: Diagnosis not present

## 2015-10-25 DIAGNOSIS — Z885 Allergy status to narcotic agent status: Secondary | ICD-10-CM | POA: Diagnosis not present

## 2015-10-25 DIAGNOSIS — Z981 Arthrodesis status: Secondary | ICD-10-CM | POA: Diagnosis not present

## 2015-10-25 DIAGNOSIS — M549 Dorsalgia, unspecified: Secondary | ICD-10-CM | POA: Diagnosis not present

## 2015-10-26 ENCOUNTER — Other Ambulatory Visit: Payer: Medicare Other

## 2015-10-26 DIAGNOSIS — E559 Vitamin D deficiency, unspecified: Secondary | ICD-10-CM | POA: Diagnosis not present

## 2015-10-26 DIAGNOSIS — Z85820 Personal history of malignant melanoma of skin: Secondary | ICD-10-CM | POA: Diagnosis not present

## 2015-10-26 DIAGNOSIS — L84 Corns and callosities: Secondary | ICD-10-CM | POA: Diagnosis not present

## 2015-10-27 LAB — VITAMIN D 25 HYDROXY (VIT D DEFICIENCY, FRACTURES): Vit D, 25-Hydroxy: 46 ng/mL (ref 30–100)

## 2015-11-08 DIAGNOSIS — G894 Chronic pain syndrome: Secondary | ICD-10-CM | POA: Diagnosis not present

## 2015-11-08 DIAGNOSIS — M961 Postlaminectomy syndrome, not elsewhere classified: Secondary | ICD-10-CM | POA: Diagnosis not present

## 2015-11-08 DIAGNOSIS — M4693 Unspecified inflammatory spondylopathy, cervicothoracic region: Secondary | ICD-10-CM | POA: Diagnosis not present

## 2015-11-08 DIAGNOSIS — Z79891 Long term (current) use of opiate analgesic: Secondary | ICD-10-CM | POA: Diagnosis not present

## 2015-11-09 ENCOUNTER — Telehealth: Payer: Self-pay

## 2015-11-09 ENCOUNTER — Telehealth: Payer: Self-pay | Admitting: Family Medicine

## 2015-11-09 NOTE — Telephone Encounter (Signed)
Patient just wanted to check on Vit D result rechecked end of April 2017. Informed normal result.  She wanted to confirm she should now take Vit D3 2000 units daily and per Dr Moshe Salisbury note on 07/2015 Vit D level I confirmed that with her.

## 2015-11-09 NOTE — Telephone Encounter (Signed)
Patient notified of PCP recommendations and is agreement and expresses an understanding.  She states that she uses 1/2 tablet twice daily. Pt was advised to cut back to 1/2 tablet for a few days and then she can stop.

## 2015-11-09 NOTE — Telephone Encounter (Signed)
Pt states that she

## 2015-11-09 NOTE — Telephone Encounter (Signed)
She has been on Klonopin for quite awhile so she may have a difficult time just stopping it- feeling dizzy, shaking, tremor of hand, mild nausea.  It would be best to decrease it to once daily for a few days and then stop it

## 2015-11-09 NOTE — Telephone Encounter (Signed)
Pt states that her pain management Dr told her to stop taking klonopin due to taking oxycodone. Pt asking if there will be any side effects to look out for.

## 2015-11-14 ENCOUNTER — Telehealth: Payer: Self-pay | Admitting: Family Medicine

## 2015-11-14 MED ORDER — BUSPIRONE HCL 15 MG PO TABS: ORAL_TABLET | ORAL | Status: DC

## 2015-11-14 NOTE — Telephone Encounter (Signed)
Pt states that she is having really bad anxiety and needs an Rx that doesn't have any benzodiazepines in it due to taking oxycodone.

## 2015-11-14 NOTE — Telephone Encounter (Signed)
Can start Buspar 15mg - 1/2 tab twice daily x2 weeks and then increase to 1 full tab twice daily

## 2015-11-14 NOTE — Telephone Encounter (Signed)
Patient notified of PCP recommendations and is agreement and expresses an understanding.  

## 2015-11-15 ENCOUNTER — Telehealth: Payer: Self-pay | Admitting: Family Medicine

## 2015-11-15 NOTE — Telephone Encounter (Signed)
Taylor for pt to take 1/2 Klonopin as needed

## 2015-11-15 NOTE — Telephone Encounter (Signed)
Pt states that the buspar, pt states that it makes her depression and anxiety so much worse and asking if 1/2 of the klonopin would be ok to take with her pain meds.

## 2015-11-16 ENCOUNTER — Encounter: Payer: Self-pay | Admitting: Family Medicine

## 2015-11-16 ENCOUNTER — Ambulatory Visit (INDEPENDENT_AMBULATORY_CARE_PROVIDER_SITE_OTHER): Payer: Medicare Other | Admitting: Family Medicine

## 2015-11-16 VITALS — BP 122/80 | HR 98 | Temp 98.1°F | Resp 16 | Ht 66.0 in | Wt 117.5 lb

## 2015-11-16 DIAGNOSIS — H6123 Impacted cerumen, bilateral: Secondary | ICD-10-CM

## 2015-11-16 DIAGNOSIS — H918X3 Other specified hearing loss, bilateral: Secondary | ICD-10-CM | POA: Diagnosis not present

## 2015-11-16 DIAGNOSIS — F419 Anxiety disorder, unspecified: Secondary | ICD-10-CM

## 2015-11-16 DIAGNOSIS — R42 Dizziness and giddiness: Secondary | ICD-10-CM | POA: Diagnosis not present

## 2015-11-16 NOTE — Assessment & Plan Note (Signed)
Ongoing issue for pt.  She has struggled for years with this and she has not been able to tolerate SSRIs, Wellbutrin, or Buspar.  I understand why her pain management doctor wants her to stop the benzos but I don't see that she will be able to wean off this- we have tried multiple times in the past.  I will be happy to discuss this w/ her pain management doctor.  Pt expressed understanding and is in agreement w/ plan.

## 2015-11-16 NOTE — Telephone Encounter (Signed)
Patient notified of PCP recommendations and is agreement and expresses an understanding. Will discuss further with PCP at office today.

## 2015-11-16 NOTE — Assessment & Plan Note (Signed)
New.  Suspect this is due to her pain medication and possibly her wax buildup.  No red flags on exam today.  Encouraged increased hydration, changing positions slowly.  Reviewed supportive care and red flags that should prompt return.  Pt expressed understanding and is in agreement w/ plan.

## 2015-11-16 NOTE — Assessment & Plan Note (Signed)
Recurring problem for pt.  Hearing immediately approved w/ wax removal and dizziness also improved.  Will follow.

## 2015-11-16 NOTE — Progress Notes (Signed)
   Subjective:    Patient ID: [name], female    DOB: [birth year], 70 y.o.   MRN: [id number]  HPI Anxiety- chronic problem, pt was told by pain management to stop her Clonazepam.  This has been a long term medication for pt.  Every daily medication we have tried for pt has 'worsened' her anxiety.  She has been intolerant to SSRIs and now Buspar.  Pt took 1/2 Klonopin last night and again this AM and feels 'much better'.  Dizziness- 'i have these real bad dizzy spells'.  + nausea.  Denies vertigo, 'i feel off balance'.  Has sensation of feeling 'underwater' like she did when her ear was blocked w/ wax.  Pt feels she needs to sit or lie down every time she stands up.  Pt is taking Oxy liquid and fears this is contributing.   Review of Systems For ROS see HPI     Objective:   Physical Exam  Constitutional: She is oriented to person, place, and time. She appears well-developed. No distress.  HENT:  Head: Normocephalic and atraumatic.  TMs obscured by cerumen bilaterally- removed in office w/ curette and alligator forceps w/ instantaneous improvement of dizziness.  TMs WNL bilaterally  Eyes: Conjunctivae and EOM are normal. Pupils are equal, round, and reactive to light.  No nystagmus  Cardiovascular: Normal rate, regular rhythm, normal heart sounds and intact distal pulses.   Pulmonary/Chest: Effort normal and breath sounds normal. No respiratory distress. She has no wheezes. She has no rales.  Musculoskeletal: She exhibits no edema or tenderness.  Neurological: She is alert and oriented to person, place, and time. No cranial nerve deficit. Coordination normal.  Skin: Skin is warm and dry.  Psychiatric: She has a normal mood and affect. Her behavior is normal. Thought content normal.  Vitals reviewed.         Assessment & Plan:

## 2015-11-16 NOTE — Patient Instructions (Signed)
Follow up as needed Continue the 1/2 tab of the Klonopin twice daily for the anxiety STOP the Buspar For the dizziness- drink plenty of fluids, change positions slowly The dizziness may have been wax or medication related but there was nothing concerning on exam Call with any questions or concerns Hang in there!

## 2015-11-16 NOTE — Progress Notes (Signed)
Pre visit review using our clinic review tool, if applicable. No additional management support is needed unless otherwise documented below in the visit note. 

## 2015-11-19 DIAGNOSIS — G8928 Other chronic postprocedural pain: Secondary | ICD-10-CM | POA: Diagnosis not present

## 2015-11-20 DIAGNOSIS — G8928 Other chronic postprocedural pain: Secondary | ICD-10-CM | POA: Diagnosis not present

## 2015-11-22 DIAGNOSIS — G8928 Other chronic postprocedural pain: Secondary | ICD-10-CM | POA: Diagnosis not present

## 2015-11-23 DIAGNOSIS — R0602 Shortness of breath: Secondary | ICD-10-CM | POA: Diagnosis not present

## 2015-11-23 DIAGNOSIS — I348 Other nonrheumatic mitral valve disorders: Secondary | ICD-10-CM | POA: Diagnosis not present

## 2015-11-23 DIAGNOSIS — I480 Paroxysmal atrial fibrillation: Secondary | ICD-10-CM | POA: Diagnosis not present

## 2015-11-23 DIAGNOSIS — F410 Panic disorder [episodic paroxysmal anxiety] without agoraphobia: Secondary | ICD-10-CM | POA: Diagnosis not present

## 2015-11-23 DIAGNOSIS — R002 Palpitations: Secondary | ICD-10-CM | POA: Diagnosis not present

## 2015-11-26 DIAGNOSIS — G8928 Other chronic postprocedural pain: Secondary | ICD-10-CM | POA: Diagnosis not present

## 2015-11-28 DIAGNOSIS — G8928 Other chronic postprocedural pain: Secondary | ICD-10-CM | POA: Diagnosis not present

## 2015-11-29 ENCOUNTER — Telehealth: Payer: Self-pay | Admitting: Family Medicine

## 2015-11-29 DIAGNOSIS — G5603 Carpal tunnel syndrome, bilateral upper limbs: Secondary | ICD-10-CM | POA: Diagnosis not present

## 2015-11-29 DIAGNOSIS — R202 Paresthesia of skin: Secondary | ICD-10-CM | POA: Diagnosis not present

## 2015-11-29 DIAGNOSIS — M5481 Occipital neuralgia: Secondary | ICD-10-CM | POA: Diagnosis not present

## 2015-11-29 DIAGNOSIS — M461 Sacroiliitis, not elsewhere classified: Secondary | ICD-10-CM | POA: Diagnosis not present

## 2015-11-29 DIAGNOSIS — G500 Trigeminal neuralgia: Secondary | ICD-10-CM | POA: Diagnosis not present

## 2015-11-29 DIAGNOSIS — E538 Deficiency of other specified B group vitamins: Secondary | ICD-10-CM | POA: Diagnosis not present

## 2015-11-29 DIAGNOSIS — G609 Hereditary and idiopathic neuropathy, unspecified: Secondary | ICD-10-CM | POA: Diagnosis not present

## 2015-11-29 DIAGNOSIS — E531 Pyridoxine deficiency: Secondary | ICD-10-CM | POA: Diagnosis not present

## 2015-11-29 DIAGNOSIS — M5412 Radiculopathy, cervical region: Secondary | ICD-10-CM | POA: Diagnosis not present

## 2015-11-29 DIAGNOSIS — R270 Ataxia, unspecified: Secondary | ICD-10-CM | POA: Diagnosis not present

## 2015-11-29 DIAGNOSIS — R634 Abnormal weight loss: Secondary | ICD-10-CM | POA: Diagnosis not present

## 2015-11-29 DIAGNOSIS — M5416 Radiculopathy, lumbar region: Secondary | ICD-10-CM | POA: Diagnosis not present

## 2015-11-29 DIAGNOSIS — G603 Idiopathic progressive neuropathy: Secondary | ICD-10-CM | POA: Diagnosis not present

## 2015-11-29 NOTE — Telephone Encounter (Signed)
Yes- please take the Lyrica as directed

## 2015-11-29 NOTE — Telephone Encounter (Signed)
Patient notified of PCP recommendations and is agreement and expresses an understanding.  

## 2015-11-29 NOTE — Telephone Encounter (Signed)
Pt states that her Neurologist has prescribed lyrica for her and asking with all other meds she is taking is this ok and will there be any affects.

## 2015-12-07 ENCOUNTER — Telehealth: Payer: Self-pay | Admitting: Family Medicine

## 2015-12-07 NOTE — Telephone Encounter (Signed)
I would not recommend this until she clears it w/ Neurosurgery as she has hardware in her neck and they use magnetic fields in this treatment

## 2015-12-07 NOTE — Telephone Encounter (Signed)
Patient notified of PCP recommendations and is agreement and expresses an understanding.  Will call neurosurgery and have them give the approval.

## 2015-12-07 NOTE — Telephone Encounter (Signed)
Pt asking what [name] thinks of pt attending [facility] for treating her anxiety & depression. This is offered through [facility].

## 2015-12-10 DIAGNOSIS — M542 Cervicalgia: Secondary | ICD-10-CM | POA: Diagnosis not present

## 2015-12-17 DIAGNOSIS — G8928 Other chronic postprocedural pain: Secondary | ICD-10-CM | POA: Diagnosis not present

## 2015-12-19 ENCOUNTER — Telehealth: Payer: Self-pay | Admitting: Family Medicine

## 2015-12-19 NOTE — Telephone Encounter (Signed)
If she has left messages w/ his office, this should be sufficient for him to call back and get her scheduled.  Due to the amount of hardware in her neck, I don't feel comfortable ordering imaging and I don't feel comfortable telling him to order it as this would be telling him how to do his job.  She should try and get an appt w/ him for ongoing management

## 2015-12-19 NOTE — Telephone Encounter (Signed)
Patient notified of PCP recommendations and is agreement and expresses an understanding.  

## 2015-12-19 NOTE — Telephone Encounter (Signed)
Pt states that she has been trying to get in touch with her neurosurgeon [name] office for days with no luck. Pt states that she is having neck pain and asking for more imaging to be done. Pt is calling us to see if [name] would call [name] office to expedite a call back to pt for scheduling an appt

## 2015-12-24 ENCOUNTER — Telehealth: Payer: Self-pay | Admitting: *Deleted

## 2015-12-24 DIAGNOSIS — G8928 Other chronic postprocedural pain: Secondary | ICD-10-CM | POA: Diagnosis not present

## 2015-12-24 NOTE — Telephone Encounter (Signed)
Chlorine can definitely be a contributing factor especially people with sensitive skin.She could put on a small amount (thin film) vaseline over a pad underneath her bathing suit to see if this helps.

## 2015-12-24 NOTE — Telephone Encounter (Signed)
Pt has neck pain her "pain management MD" recommended she do aquatic physical therapy was going 3 times a week, now only once weekly. Pt said the therapy is helping with pain, but has noticed vaginal burning after, she is thinking that the pool water could be causing her Interstitial cystitis to flare up. Pt has be doing well with no symptoms at all until therapy. Pt asked if it could be related to the chlorine and chemical in pools? Pt would like your thoughts about this. Please advise

## 2015-12-25 ENCOUNTER — Telehealth: Payer: Self-pay | Admitting: Family Medicine

## 2015-12-25 DIAGNOSIS — F4323 Adjustment disorder with mixed anxiety and depressed mood: Secondary | ICD-10-CM

## 2015-12-25 NOTE — Telephone Encounter (Signed)
Spoke with pt and she advised that she called [facility] in [place] and they advised pt she would need a referral. Referral placed.

## 2015-12-25 NOTE — Telephone Encounter (Signed)
Pt informed with the below note. 

## 2015-12-25 NOTE — Telephone Encounter (Signed)
Pt asking for a referral to [facility] patient for anxiety.

## 2015-12-26 ENCOUNTER — Other Ambulatory Visit: Payer: Self-pay | Admitting: Medical

## 2015-12-28 ENCOUNTER — Ambulatory Visit (INDEPENDENT_AMBULATORY_CARE_PROVIDER_SITE_OTHER): Payer: Medicare Other | Admitting: Podiatry

## 2015-12-28 ENCOUNTER — Encounter: Payer: Self-pay | Admitting: Podiatry

## 2015-12-28 ENCOUNTER — Telehealth: Payer: Self-pay | Admitting: *Deleted

## 2015-12-28 ENCOUNTER — Ambulatory Visit (INDEPENDENT_AMBULATORY_CARE_PROVIDER_SITE_OTHER): Payer: Medicare Other

## 2015-12-28 VITALS — BP 116/67 | HR 84 | Resp 16

## 2015-12-28 DIAGNOSIS — L84 Corns and callosities: Secondary | ICD-10-CM

## 2015-12-28 DIAGNOSIS — M79673 Pain in unspecified foot: Secondary | ICD-10-CM

## 2015-12-28 DIAGNOSIS — M779 Enthesopathy, unspecified: Secondary | ICD-10-CM | POA: Diagnosis not present

## 2015-12-28 MED ORDER — TRIAMCINOLONE ACETONIDE 10 MG/ML IJ SUSP
10.0000 mg | Freq: Once | INTRAMUSCULAR | Status: AC
  Administered 2015-12-28: 10 mg

## 2015-12-28 NOTE — Telephone Encounter (Signed)
Pt states she had a procedure by Dr.Regal today and does water PT and wanted to know if she would be able to do so the 1st of the week.  I told pt she should be able to do the PT as long as she had no broken skin or bleeding to the area.  Pt states understanding.

## 2015-12-28 NOTE — Progress Notes (Signed)
   Subjective:    Patient ID: [name], female    DOB: October 27, 1945, 70 y.o.   MRN: [id number]  HPI  Chief Complaint  Patient presents with  . Foot Pain    Sub 1st and 5th MPJ bilateral - right over left, tender for several months, callused areas, Dr. Ubaldo Glassing sanded them down but didn't help, back fusion she's not able to bend over to treat them herself       Review of Systems  HENT: Positive for hearing loss, sinus pressure and tinnitus.   Respiratory: Positive for apnea.   Cardiovascular: Positive for palpitations and leg swelling.  Neurological: Positive for dizziness.  Psychiatric/Behavioral: The patient is nervous/anxious.   All other systems reviewed and are negative.      Objective:   Physical Exam        Assessment & Plan:

## 2015-12-30 NOTE — Progress Notes (Signed)
Subjective:     Patient ID: [name], female   DOB: 06/25/1946, 70 y.o.   MRN: [id number]  HPI patient presents stating I have had a lot of pain on the bottom of my right foot over left foot and the lesions have gotten thicker especially on the outside and I feel like there is fluid in   Review of Systems  All other systems reviewed and are negative.      Objective:   Physical Exam  Constitutional: She is oriented to person, place, and time.  Cardiovascular: Intact distal pulses.   Musculoskeletal: Normal range of motion.  Neurological: She is oriented to person, place, and time.  Skin: Skin is warm.  Nursing note and vitals reviewed.  Neurovascular status intact muscle strength adequate range of motion within normal limits with patient noted to have inflammation and pain in the plantar aspect right foot around the fifth MPJ with fluid buildup and lesion formation first and fifth metatarsal and fifth metatarsal of the other foot. Patient's found to have good digital perfusion and is well oriented 3     Assessment:     Inflammatory capsulitis fifth MPJ right with fluid buildup and lesion formations and adjacent metatarsals    Plan:     H&P and x-rays reviewed with patient. Careful capsular injection administered right 3 mg dexamethasone Kenalog 5 mg Xylocaine and debrided lesions fully and discussed not going barefoot and wearing padding and the possibility long-term of osteotomy or met head resection  Report indicated that there is some enlargement around the fifth MPJ right over left

## 2016-01-01 DIAGNOSIS — G8928 Other chronic postprocedural pain: Secondary | ICD-10-CM | POA: Diagnosis not present

## 2016-01-03 ENCOUNTER — Ambulatory Visit: Payer: Medicare Other | Admitting: Podiatry

## 2016-01-10 DIAGNOSIS — R1013 Epigastric pain: Secondary | ICD-10-CM | POA: Diagnosis not present

## 2016-01-11 ENCOUNTER — Ambulatory Visit (INDEPENDENT_AMBULATORY_CARE_PROVIDER_SITE_OTHER): Payer: Medicare Other | Admitting: Psychiatry

## 2016-01-11 ENCOUNTER — Encounter (HOSPITAL_COMMUNITY): Payer: Self-pay | Admitting: Psychiatry

## 2016-01-11 VITALS — BP 122/78 | HR 78 | Ht 66.0 in | Wt 118.0 lb

## 2016-01-11 DIAGNOSIS — F411 Generalized anxiety disorder: Secondary | ICD-10-CM | POA: Diagnosis not present

## 2016-01-11 DIAGNOSIS — F0630 Mood disorder due to known physiological condition, unspecified: Secondary | ICD-10-CM | POA: Diagnosis not present

## 2016-01-11 DIAGNOSIS — F410 Panic disorder [episodic paroxysmal anxiety] without agoraphobia: Secondary | ICD-10-CM

## 2016-01-11 DIAGNOSIS — R52 Pain, unspecified: Secondary | ICD-10-CM

## 2016-01-11 MED ORDER — ESCITALOPRAM OXALATE 5 MG PO TABS: 5.0000 mg | ORAL_TABLET | Freq: Every day | ORAL | Status: DC
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
## 2016-01-14 ENCOUNTER — Telehealth (HOSPITAL_COMMUNITY): Payer: Self-pay | Admitting: Psychiatry

## 2016-01-14 DIAGNOSIS — M545 Low back pain: Secondary | ICD-10-CM | POA: Diagnosis not present

## 2016-01-14 DIAGNOSIS — M5031 Other cervical disc degeneration,  high cervical region: Secondary | ICD-10-CM | POA: Diagnosis not present

## 2016-01-14 DIAGNOSIS — Z981 Arthrodesis status: Secondary | ICD-10-CM | POA: Diagnosis not present

## 2016-01-14 DIAGNOSIS — G8929 Other chronic pain: Secondary | ICD-10-CM | POA: Diagnosis not present

## 2016-01-14 DIAGNOSIS — M542 Cervicalgia: Secondary | ICD-10-CM | POA: Diagnosis not present

## 2016-01-14 NOTE — Telephone Encounter (Signed)
Would recommend give some more time and dose is low. Monitor side effects or concerns and call back in a week or earlier if needed unless have to go to ED for urgent concerns.

## 2016-01-14 NOTE — Telephone Encounter (Signed)
Pt express concerns about Lexapro 5mg . Pt states medication is not agreeing with her and would like to try another medication. Pt has tried Cymbalta, Prozac and Lexapro in the past. Please advise.

## 2016-01-14 NOTE — Telephone Encounter (Signed)
Return telephone call to pt. Pt states she would like to try another medication. Pt states she had an adverse reaction when she took Lexapro. Informed pt, Dr.Akhtar would like for her to give the medication more time to work, pt refused. Offered pt an earlier appt to discuss medication changes with provider. Pt is schedule for a f/u appt on 01/15/16.

## 2016-01-15 ENCOUNTER — Ambulatory Visit (INDEPENDENT_AMBULATORY_CARE_PROVIDER_SITE_OTHER): Payer: Medicare Other | Admitting: Psychiatry

## 2016-01-15 ENCOUNTER — Encounter (HOSPITAL_COMMUNITY): Payer: Self-pay | Admitting: Psychiatry

## 2016-01-15 VITALS — BP 128/84 | HR 94 | Ht 66.0 in | Wt 118.0 lb

## 2016-01-15 DIAGNOSIS — R52 Pain, unspecified: Secondary | ICD-10-CM

## 2016-01-15 DIAGNOSIS — F0630 Mood disorder due to known physiological condition, unspecified: Secondary | ICD-10-CM | POA: Diagnosis not present

## 2016-01-15 DIAGNOSIS — F410 Panic disorder [episodic paroxysmal anxiety] without agoraphobia: Secondary | ICD-10-CM | POA: Diagnosis not present

## 2016-01-15 DIAGNOSIS — F411 Generalized anxiety disorder: Secondary | ICD-10-CM

## 2016-01-15 NOTE — Telephone Encounter (Signed)
Patient seen today. See notes

## 2016-01-15 NOTE — Progress Notes (Signed)
Patient ID: [name], female   DOB: 03/31/1946, 70 y.o.   MRN: [id number]  [facility]  Outpatient Follow up visit  Patient Identification: [name] MRN:  [id number] Date of Evaluation:  01/15/2016 Referral Source: Primary care Chief Complaint:   Chief Complaint    Medication Problem     Visit Diagnosis:    ICD-9-CM ICD-10-CM   1. 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 GAD (generalized anxiety disorder) 300.02 F41.1   3. Pain 780.96 R52   4. Panic attacks 300.01 F41.0     History of Present Illness:  70 years old currently single Caucasian female initially  referred for management of possible depression, anxiety she suffers from pain because of spinal fusion surgeries and history of scoliosis.  Last visit we started Lexapro 5 mg for depression and anxiety. She was unable to tolerate it. Patient says does not want to be on medication she wants to focus on her pain and she is getting a pain medication appointment may start on a muscle relaxant or talk about Botox injections Says down the road she wants to get off of the pain medication as well as she is worried that the Klonopin may be stopped in case he continues oxycodone by pain doctor. Remains anxious about that but for now klonopine only helps. Other medications were not able to tolerate including buspar.  She suffers from pain, thinks about plan patient has major immobile to certain extent and also frustrated No paranoia, mania, psychosis Aggravating factor: pain, scoliosis, surgeries. Stopped walking in 2014 when things got worse. Prior to that she was on pain injections.  Modifying factor: son, pet cat.   Associated Signs/Symptoms: Depression Symptoms:  Sad and withdrawn. Due to pain (Hypo) Manic Symptoms:  Distractibility, Anxiety Symptoms:  Excessive Worry, Panic Symptoms, Psychotic Symptoms:  denies PTSD Symptoms: NA   Substance Abuse History in the last 12 months:  No.  Consequences of  Substance Abuse: NA  Past Medical History:  Past Medical History  Diagnosis Date  . Vaginal delivery     ONE NSVD  . Vulvodynia   . PMDD (premenstrual dysphoric disorder)   . IC (intermittent claudication) ([place])   . Atrial fibrillation ([place])   . 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 Rotator cuff tear   . Melanoma ([place])   . Kidney stone   . Heart disease     noted A Fib once  . GERD (gastroesophageal reflux disease)   . Herpes   . Allergy   . Anxiety   . Benign paroxysmal positional vertigo 06/08/2013  . Peripheral neuropathy ([place]) 11/06/2011  . Hair loss 04/12/2012  . Osteopenia 02/18/2012  . 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 Interstitial cystitis 11/06/2011  . Fracture of multiple ribs   . Chronic a-fib ([place]) 08/07/2014    Past Surgical History  Procedure Laterality Date  . Vaginal hysterectomy      TVH  . Back surgery      DISC FUSION L5,L6,L7  . Bunionectomy    . Head & neck skin lesion excisional biopsy      melanoma  . Bladder surgery      Bladder sling  . Rotator cuff repair  2012    RIGHT  . Breast surgery      BREAST BIOPSY--RIGHT BENIGN  . Spinal fusion  dec 2015   . Neck fusion      Family Psychiatric History: brother, sister has depression  Family History:  Family History  Problem  Relation Age of Onset  . Diabetes Father   . Hyperlipidemia Sister   . Heart disease Sister   . Stroke Sister   . Diabetes Brother   . Hyperlipidemia Sister   . Heart disease Sister   . Arthritis Mother   . Cancer Mother     uterus  . Heart disease Mother   . Diabetes Brother   . Heart Problems Brother     Social History:   Social History   Social History  . Marital Status: Divorced    Spouse Name: N/A  . Number of Children: N/A  . Years of Education: N/A   Social History Main Topics  . Smoking status: Former Smoker -- 0.25 packs/day for 14 years    Types: Cigarettes    Quit date: 07/07/1984  . Smokeless tobacco: None  . Alcohol Use: No  . Drug Use: No  . Sexual Activity:  Not Asked   Other Topics Concern  . None   Social History Narrative   Allergies:   Allergies  Allergen Reactions  . Contrast Media [Iodinated Diagnostic Agents] Hives    unknown  . Erythromycin     unknown  . Latex     hives  . Levofloxacin Nausea And Vomiting  . Other     IV CONTRAST /"DYE".  . Oxycodone     Other reaction(s): Delusions (intolerance)  . Septra [Bactrim]     Hives   . Sulfa Antibiotics     rash  . Morphine Anxiety    Metabolic Disorder Labs: No results found for: HGBA1C, MPG No results found for: PROLACTIN Lab Results  Component Value Date   CHOL 211* 10/01/2015   TRIG 67.0 10/01/2015   HDL 95.10 10/01/2015   CHOLHDL 2 10/01/2015   VLDL 13.4 10/01/2015   LDLCALC 102* 10/01/2015   LDLCALC 96 04/19/2015     Current Medications: Current Outpatient Prescriptions  Medication Sig Dispense Refill  . acetaminophen (TYLENOL) 500 MG tablet Take 1,000 mg by mouth every 6 (six) hours as needed for mild pain.     [name] aspirin 81 MG tablet Take 81 mg by mouth daily.    . Calcium-Vitamin D-Vitamin K (VIACTIV PO) Take by mouth.    . carvedilol (COREG) 3.125 MG tablet TAKE ONE-HALF TABLET BY MOUTH TWICE DAILY WITH MEALS. 30 tablet 6  . Cholecalciferol (VITAMIN D3) 1000 UNITS CAPS Take by mouth.    . clonazePAM (KLONOPIN) 0.5 MG tablet Take 0.5 tablets (0.25 mg total) by mouth 2 (two) times daily as needed for anxiety. 60 tablet 1  . escitalopram (LEXAPRO) 5 MG tablet Take 1 tablet (5 mg total) by mouth daily. 30 tablet 0  . famotidine (PEPCID) 40 MG tablet Take 40 mg by mouth daily.    . fluticasone (FLONASE) 50 MCG/ACT nasal spray Place 2 sprays into both nostrils daily. 16 g 6  . furosemide (LASIX) 20 MG tablet Take 1 tablet (20 mg total) by mouth daily. 30 tablet 1  . loratadine (CLARITIN) 10 MG tablet Take 10 mg by mouth daily.    . meclizine (ANTIVERT) 12.5 MG tablet TAKE ONE TABLET BY MOUTH THREE TIMES DAILY AS NEEDED FOR DIZZINESS 15 tablet 0  .  metoprolol tartrate (LOPRESSOR) 25 MG tablet     . ondansetron (ZOFRAN) 4 MG tablet TAKE ONE TABLET BY MOUTH EVERY 8 HOURS AS NEEDED FOR NAUSEA OR VOMITING 20 tablet 3  . oxyCODONE (ROXICODONE) 5 MG/5ML solution     . polyethylene glycol powder (GLYCOLAX/MIRALAX) powder     .  Probiotic Product (RESTORA PO) Take by mouth.    . valACYclovir (VALTREX) 500 MG tablet      No current facility-administered medications for this visit.    Neurologic: Headache: No Seizure: No Paresthesias:Yes  Musculoskeletal: Strength & Muscle Tone: within normal limits Gait & Station: stiff Patient leans: Front  Psychiatric Specialty Exam: Review of Systems  Constitutional: Negative for fever.  Cardiovascular: Negative for chest pain.  Musculoskeletal: Positive for back pain and neck pain.  Skin: Negative for rash.  Psychiatric/Behavioral: Positive for depression. Negative for suicidal ideas. The patient is nervous/anxious.     Blood pressure 128/84, pulse 94, height 5\' 6"  (1.676 m), weight 118 lb (53.524 kg), SpO2 96 %.Body mass index is 19.05 kg/(m^2).  General Appearance: Casual  Eye Contact:  Fair  Speech:  Normal Rate  Volume:  Normal  Mood:  Anxious   Affect:  constricted  Thought Process:  Goal Directed  Orientation:  Full (Time, Place, and Person)  Thought Content:  Rumination  Suicidal Thoughts:  No  Homicidal Thoughts:  No  Memory:  Immediate;   Fair Recent;   Fair  Judgement:  Fair  Insight:  Shallow  Psychomotor Activity:  Decreased  Concentration:  Concentration: Fair and Attention Span: Fair  Recall:  [employer] of Knowledge:Fair  Language: Fair  Akathisia:  Negative  Handed:  Right  AIMS (if indicated):    Assets:  Desire for Improvement  ADL's:  Intact  Cognition: WNL  Sleep:  Variable     Treatment Plan Summary: Medication management and Plan as follows   Mood disorder possible secondary to [facility] / Pain:  Does not want to be on mood stabilizer or SSRI for now till  her pain is resolved. We will stop lexapro.  She may consider muscle relaxer started by ED doctor  Has good support from her son.  As of now she is wanting to continue Klonopin 0.5 mg half tablet twice a day as it helps anxiety and she feels panicky when thinks of stopping it She'll follow up with her pain medication clinic in regarding to his ongoing use along with oxycodone before not possible Recommend to keep appointment for therapy   GAD: lexapro as above  Pain: follow up with Dr. Selinda Orion Provider psycho therapy more than 50% of time spent in counseling and coordination of care including patient education. Reviewed sleep hygiene Patient to call 911 or report local emergency room for any urgent concerns or suicidal thoughts Follow up in 1 month or earlier if needed.   Merian Capron, MD 7/11/201710:49 AM

## 2016-01-16 DIAGNOSIS — G8928 Other chronic postprocedural pain: Secondary | ICD-10-CM | POA: Diagnosis not present

## 2016-01-21 DIAGNOSIS — G8928 Other chronic postprocedural pain: Secondary | ICD-10-CM | POA: Diagnosis not present

## 2016-01-24 ENCOUNTER — Other Ambulatory Visit: Payer: Self-pay | Admitting: Family Medicine

## 2016-01-24 NOTE — Telephone Encounter (Signed)
Medication filled to pharmacy as requested.   

## 2016-01-28 DIAGNOSIS — M4693 Unspecified inflammatory spondylopathy, cervicothoracic region: Secondary | ICD-10-CM | POA: Diagnosis not present

## 2016-01-28 DIAGNOSIS — Z79891 Long term (current) use of opiate analgesic: Secondary | ICD-10-CM | POA: Diagnosis not present

## 2016-01-28 DIAGNOSIS — M961 Postlaminectomy syndrome, not elsewhere classified: Secondary | ICD-10-CM | POA: Diagnosis not present

## 2016-01-28 DIAGNOSIS — G894 Chronic pain syndrome: Secondary | ICD-10-CM | POA: Diagnosis not present

## 2016-02-02 DIAGNOSIS — M25521 Pain in right elbow: Secondary | ICD-10-CM | POA: Diagnosis not present

## 2016-02-05 ENCOUNTER — Ambulatory Visit (HOSPITAL_COMMUNITY): Payer: Self-pay | Admitting: Psychiatry

## 2016-02-11 ENCOUNTER — Telehealth: Payer: Self-pay | Admitting: Emergency Medicine

## 2016-02-11 ENCOUNTER — Other Ambulatory Visit: Payer: Self-pay | Admitting: Family Medicine

## 2016-02-11 NOTE — Telephone Encounter (Signed)
Last OV 11/16/15 Clonazepam last filled 08/17/15 #60 with 1

## 2016-02-11 NOTE — Telephone Encounter (Signed)
Patient called about refill of the clonazepam for anxiety. Patient states she has seen the psychiatrist who started her on Lexapro. She is not currently taking at this time. She is currently taking Oxycodone and was advised not to take anti-depressant with medication. She has pending appt with Behavioral health in [place] on 02/12/16. She has pending appointment with [name] on Wed for acute visit. Please advise

## 2016-02-11 NOTE — Telephone Encounter (Signed)
Medication filled to pharmacy as requested.   

## 2016-02-11 NOTE — Telephone Encounter (Signed)
It is the Clonazepam/Oxy combo that is dangerous, NOT the lexapro.  I have had difficulty w/ psychiatrists feeling like we are undermining them and their treatment plans and I will not fill the Clonazepam at this time.  She will need to get it from their office

## 2016-02-11 NOTE — Telephone Encounter (Signed)
Patient notified of PCP recommendations and is agreement and expresses an understanding.  

## 2016-02-12 ENCOUNTER — Ambulatory Visit (INDEPENDENT_AMBULATORY_CARE_PROVIDER_SITE_OTHER): Payer: Medicare Other | Admitting: Licensed Clinical Social Worker

## 2016-02-12 DIAGNOSIS — F4323 Adjustment disorder with mixed anxiety and depressed mood: Secondary | ICD-10-CM

## 2016-02-12 DIAGNOSIS — G8929 Other chronic pain: Secondary | ICD-10-CM

## 2016-02-13 ENCOUNTER — Ambulatory Visit (INDEPENDENT_AMBULATORY_CARE_PROVIDER_SITE_OTHER): Payer: Medicare Other | Admitting: Family Medicine

## 2016-02-13 ENCOUNTER — Encounter: Payer: Self-pay | Admitting: Family Medicine

## 2016-02-13 ENCOUNTER — Encounter: Payer: Self-pay | Admitting: General Practice

## 2016-02-13 VITALS — BP 122/76 | HR 76 | Temp 98.1°F | Resp 16 | Ht 66.0 in | Wt 120.5 lb

## 2016-02-13 DIAGNOSIS — F419 Anxiety disorder, unspecified: Secondary | ICD-10-CM

## 2016-02-13 DIAGNOSIS — F329 Major depressive disorder, single episode, unspecified: Secondary | ICD-10-CM | POA: Insufficient documentation

## 2016-02-13 DIAGNOSIS — G8929 Other chronic pain: Secondary | ICD-10-CM | POA: Insufficient documentation

## 2016-02-13 MED ORDER — CLONAZEPAM 0.5 MG PO TABS: 0.2500 mg | ORAL_TABLET | Freq: Two times a day (BID) | ORAL | 1 refills | Status: DC | PRN

## 2016-02-13 NOTE — Progress Notes (Signed)
Comprehensive Clinical Assessment (CCA) Note  02/13/2016 [name] [id number]  Visit Diagnosis:      ICD-9-CM ICD-10-CM   1. Adjustment disorder with mixed anxiety and depressed mood 309.28 F43.23   2. Chronic pain 338.29 G89.29       CCA Part One  Part One has been completed on paper by the patient.  (See scanned document in Chart Review)  CCA Part Two A  Intake/Chief Complaint:  CCA Intake With Chief Complaint CCA Part Two Date: 02/12/16 CCA Part Two Time: 1359 Chief Complaint/Presenting Problem: Having trouble adjusting to emotional changes following a number of back and neck surgeries which started in 2015.  Reports "I have a constant fear that my neck is going to collapse."   Patients Currently Reported Symptoms/Problems: Has been taking Klonopin for anxiety- half a tablet in AM and half a tablet at night.  Worried because doctors are not willing to continue to prescribe it.  Says it does provide some relief.  Has been going to a pain clinic where she is prescribed oxycodone.  Reports "I hate being on it."  They have recommended she try injections to manage her pain.  Has had to limit activities due to chronic pain.  Not engaging in social activities as much as she used to.     Individual's Strengths: "I never give up."  Has been told she is "strong" and "dependable"  Has some friends and they visit sometimes Individual's Preferences: "I want to figure out how to manage this anxiety so I can regain the ability to do some of the things I used to do." Type of Services Patient Feels Are Needed: Therapy and medication management  Mental Health Symptoms Depression:  Depression: Increase/decrease in appetite, Difficulty Concentrating, Fatigue, Hopelessness, Sleep (too much or little)  Mania:  Mania: N/A  Anxiety:   Anxiety: Tension, Worrying, Sleep, Restlessness, Irritability, Fatigue, Difficulty concentrating  Psychosis:  Psychosis: N/A  Trauma:  Trauma: N/A  Obsessions:  Obsessions:  N/A  Compulsions:  Compulsions: N/A  Inattention:  Inattention: N/A  Hyperactivity/Impulsivity:  Hyperactivity/Impulsivity: N/A  Oppositional/Defiant Behaviors:  Oppositional/Defiant Behaviors: N/A  Borderline Personality:  Emotional Irregularity: N/A  Other Mood/Personality Symptoms:      Mental Status Exam Appearance and self-care  Stature:  Stature: Average  Weight:  Weight: Thin  Clothing:  Clothing: Neat/clean  Grooming:  Grooming: Normal  Cosmetic use:  Cosmetic Use: Age appropriate  Posture/gait:  Posture/Gait: Rigid  Motor activity:  Motor Activity: Not Remarkable  Sensorium  Attention:  Attention: Normal  Concentration:  Concentration: Normal  Orientation:  Orientation: X5  Recall/memory:  Recall/Memory: Normal  Affect and Mood  Affect:  Affect: Appropriate  Mood:  Mood: Anxious  Relating  Eye contact:  Eye Contact: Normal  Facial expression:  Facial Expression: Responsive  Attitude toward examiner:  Attitude Toward Examiner: Cooperative  Thought and Language  Speech flow: Speech Flow: Normal  Thought content:  Thought Content: Appropriate to mood and circumstances  Preoccupation:  Preoccupations: Somatic  Hallucinations:     Organization:     Transport planner of Knowledge:  Fund of Knowledge: Average  Intelligence:  Intelligence: Average  Abstraction:     Judgement:  Judgement: Normal  Reality Testing:  Reality Testing: Adequate  Insight:  Insight: Good  Decision Making:  Decision Making: Normal  Social Functioning  Social Maturity:  Social Maturity: Responsible  Social Judgement:  Social Judgement: Normal  Stress  Stressors:  Stressors: Illness  Coping Ability:  Coping Ability: Overwhelmed  Skill Deficits:     Supports:      Family and Psychosocial History: Family history Marital status: Divorced Divorced, when?: At age 89  Had been married since age 37  Never remarried Does patient have children?: Yes How many children?: 1 How is  patient's relationship with their children?: Son, Shanon Brow 11-08-44)  "We are a lot alike.  He has a big heart.  Doesn't put enough time into his own life."  They live together.  Childhood History:  Childhood History By whom was/is the patient raised?: Both parents Additional childhood history information: My childhood was very hard.  Father was a Ecologist.  We were very poor.  I had a wonderful mother.  I had a dad who liked to drink.  He could be cruel when he was drunk.  I wish I had known him better. Patient's description of current relationship with people who raised him/her: Mother passed away in Nov 08, 1997.  "I miss her to this day"  Father passed away in 41.   Does patient have siblings?: Yes Number of Siblings: 8 Description of patient's current relationship with siblings: Sisters live in [place], [place], and [place].  Another sister in [place].  "I lost two brothers in the past 3 years."  One brother is still living.  Siblings have been very supportive.    Did patient suffer any verbal/emotional/physical/sexual abuse as a child?: No Did patient suffer from severe childhood neglect?: No Has patient ever been sexually abused/assaulted/raped as an adolescent or adult?: No Was the patient ever a victim of a crime or a disaster?: No Witnessed domestic violence?: No Has patient been effected by domestic violence as an adult?: No  CCA Part Two B  Employment/Work Situation: Employment / Work Copywriter, advertising Employment situation: Retired Archivist job has been impacted by current illness: No  Education: Education Did Teacher, adult education From [employer]?: Yes Did Physicist, medical?: Yes What Type of College Degree Do you Have?: None, but took some classes at [employer] Did You Have Any Difficulty At [employer]?: No  Religion: Religion/Spirituality Are You A Religious Person?: Yes (Has only been able to attend church once since her surgeries) What is Your Religious Affiliation?:  Protestant  Leisure/Recreation: Leisure / Recreation Leisure and Hobbies: Likes to [employer], read, garden, feed the birds  Able to do some light chores around the house.  Exercise/Diet: Exercise/Diet Do You Exercise?: No (Used to walk every day) Have You Gained or Lost A Significant Amount of Weight in the Past Six Months?: No Do You Follow a Special Diet?: No Do You Have Any Trouble Sleeping?: Yes Explanation of Sleeping Difficulties: Mind races, chronic pain contributes to difficulties falling asleep  CCA Part Two C  Alcohol/Drug Use: Alcohol / Drug Use History of alcohol / drug use?: No history of alcohol / drug abuse                      CCA Part Three  ASAM's:  Six Dimensions of Multidimensional Assessment  Dimension 1:  Acute Intoxication and/or Withdrawal Potential:     Dimension 2:  Biomedical Conditions and Complications:     Dimension 3:  Emotional, Behavioral, or Cognitive Conditions and Complications:     Dimension 4:  Readiness to Change:     Dimension 5:  Relapse, Continued use, or Continued Problem Potential:     Dimension 6:  Recovery/Living Environment:      Substance use Disorder (SUD)    Social Function:  Social Functioning Social  Maturity: Responsible Social Judgement: Normal  Stress:  Stress Stressors: Illness Coping Ability: Overwhelmed Patient Takes Medications The Way The Doctor Instructed?: Yes  Risk Assessment- Self-Harm Potential: Risk Assessment For Self-Harm Potential Thoughts of Self-Harm: No current thoughts  Risk Assessment -Dangerous to Others Potential: Risk Assessment For Dangerous to Others Potential Method: No Plan Additional Comments for Danger to Others Potential: Denies history of harm to others  DSM5 Diagnoses: Patient Active Problem List   Diagnosis Date Noted  . Adjustment disorder with mixed anxiety and depressed mood 02/13/2016  . Chronic pain 02/13/2016  . Dizziness 11/16/2015  . B12 deficiency 10/01/2015   . Hearing loss due to cerumen impaction 08/23/2015  . Foot pain, right 05/23/2015  . Hyperlipidemia 04/19/2015  . Mastodynia, female 03/27/2015  . Dysuria 01/04/2015  . Enteritis due to Clostridium difficile 01/04/2015  . Gaseous abdominal distention 11/17/2014  . Protein-calorie malnutrition ([place]) 10/19/2014  . Fracture of multiple ribs 08/07/2014  . Paroxysmal atrial fibrillation (H[facility])   . Tachycardia   . Dyspnea 08/06/2014  . Anemia 08/06/2014  . Constipation 07/14/2014  . Lumbar back pain 05/19/2012  . Anxiety 02/18/2012  . Seasonal allergic rhinitis 11/06/2011  . S/P cervical spinal fusion 11/06/2011  . GERD (gastroesophageal reflux disease) 11/06/2011      Recommendations for Services/Supports/Treatments: Recommendations for Services/Supports/Treatments Recommendations For Services/Supports/Treatments: Individual Therapy    Garnette Scheuermann

## 2016-02-13 NOTE — Assessment & Plan Note (Signed)
Ongoing issue for pt.  She is now seeing [place] but reports she was not able to tolerate the Lexapro- 'I tried'.  She started therapy yesterday.  She indicates that psych will not fill her Klonopin script b/c they did not initiate it.  She inquired about this x2 per her report.  She is absolutely correct that she cannot stop this cold [place] b/c she has been on this for years and runs the risk of seizures.  Will refill prescription but cautioned her that this medication can be very dangerous when used w/ narcotic pain medication and she should not take more than 1/2 tab or more frequently than prescribed.  Pt expressed understanding and is in agreement w/ plan.

## 2016-02-13 NOTE — Progress Notes (Signed)
   Subjective:    Patient ID: [name], female    DOB: 10/28/1945, 70 y.o.   MRN: [id number]  HPI Anxiety- chronic problem.  Pt has been taking Klonopin 1/2 tab twice daily.  Pt reports that psych told her yesterday that they would not fill this prescription b/c they did not initiate it.  Pt took last pill yesterday and is fearful of stopping cold [place].  Pt reports she inquired at behavioral health x2 about what to do about the Clonazepam and she was told that they would not fill it b/c it came from me.  Review of Systems For ROS see HPI     Objective:   Physical Exam  Constitutional: She is oriented to person, place, and time. She appears well-developed. No distress.  Thin, frail appearing  Neurological: She is alert and oriented to person, place, and time.  Skin: Skin is warm and dry.  Psychiatric: She has a normal mood and affect. Her behavior is normal. Thought content normal.  Vitals reviewed.         Assessment & Plan:

## 2016-02-13 NOTE — Patient Instructions (Signed)
Follow up as needed Restart the Clonazepam 1/2 tab twice daily.  DO NOT take more than prescribed or more often than indicated b/c this can be dangerous when used in combination w/ pain medication Continue to work with [employer] in your counseling- I think this is a great idea Call with any questions or concerns Hang in there!!!

## 2016-02-13 NOTE — Progress Notes (Signed)
Pre visit review using our clinic review tool, if applicable. No additional management support is needed unless otherwise documented below in the visit note. 

## 2016-02-16 DIAGNOSIS — M5134 Other intervertebral disc degeneration, thoracic region: Secondary | ICD-10-CM | POA: Diagnosis not present

## 2016-02-16 DIAGNOSIS — M542 Cervicalgia: Secondary | ICD-10-CM | POA: Diagnosis not present

## 2016-02-16 DIAGNOSIS — M546 Pain in thoracic spine: Secondary | ICD-10-CM | POA: Diagnosis not present

## 2016-02-16 DIAGNOSIS — M5031 Other cervical disc degeneration,  high cervical region: Secondary | ICD-10-CM | POA: Diagnosis not present

## 2016-02-19 DIAGNOSIS — M791 Myalgia: Secondary | ICD-10-CM | POA: Diagnosis not present

## 2016-02-19 DIAGNOSIS — G894 Chronic pain syndrome: Secondary | ICD-10-CM | POA: Diagnosis not present

## 2016-02-19 DIAGNOSIS — M961 Postlaminectomy syndrome, not elsewhere classified: Secondary | ICD-10-CM | POA: Diagnosis not present

## 2016-02-19 DIAGNOSIS — M4693 Unspecified inflammatory spondylopathy, cervicothoracic region: Secondary | ICD-10-CM | POA: Diagnosis not present

## 2016-02-25 ENCOUNTER — Ambulatory Visit (HOSPITAL_COMMUNITY): Payer: Self-pay | Admitting: Licensed Clinical Social Worker

## 2016-03-01 DIAGNOSIS — M542 Cervicalgia: Secondary | ICD-10-CM | POA: Diagnosis not present

## 2016-03-06 DIAGNOSIS — D0461 Carcinoma in situ of skin of right upper limb, including shoulder: Secondary | ICD-10-CM | POA: Diagnosis not present

## 2016-03-06 DIAGNOSIS — D0462 Carcinoma in situ of skin of left upper limb, including shoulder: Secondary | ICD-10-CM | POA: Diagnosis not present

## 2016-03-06 DIAGNOSIS — Z85820 Personal history of malignant melanoma of skin: Secondary | ICD-10-CM | POA: Diagnosis not present

## 2016-03-06 DIAGNOSIS — L821 Other seborrheic keratosis: Secondary | ICD-10-CM | POA: Diagnosis not present

## 2016-03-06 DIAGNOSIS — D1801 Hemangioma of skin and subcutaneous tissue: Secondary | ICD-10-CM | POA: Diagnosis not present

## 2016-03-06 DIAGNOSIS — L820 Inflamed seborrheic keratosis: Secondary | ICD-10-CM | POA: Diagnosis not present

## 2016-03-06 DIAGNOSIS — L814 Other melanin hyperpigmentation: Secondary | ICD-10-CM | POA: Diagnosis not present

## 2016-03-06 DIAGNOSIS — D485 Neoplasm of uncertain behavior of skin: Secondary | ICD-10-CM | POA: Diagnosis not present

## 2016-03-07 DIAGNOSIS — F064 Anxiety disorder due to known physiological condition: Secondary | ICD-10-CM | POA: Diagnosis not present

## 2016-03-11 ENCOUNTER — Ambulatory Visit (HOSPITAL_COMMUNITY): Payer: Self-pay | Admitting: Psychiatry

## 2016-03-12 ENCOUNTER — Ambulatory Visit (HOSPITAL_COMMUNITY): Payer: Self-pay | Admitting: Licensed Clinical Social Worker

## 2016-03-20 DIAGNOSIS — F064 Anxiety disorder due to known physiological condition: Secondary | ICD-10-CM | POA: Diagnosis not present

## 2016-03-25 DIAGNOSIS — G894 Chronic pain syndrome: Secondary | ICD-10-CM | POA: Diagnosis not present

## 2016-03-25 DIAGNOSIS — M791 Myalgia: Secondary | ICD-10-CM | POA: Diagnosis not present

## 2016-03-27 DIAGNOSIS — F064 Anxiety disorder due to known physiological condition: Secondary | ICD-10-CM | POA: Diagnosis not present

## 2016-03-28 ENCOUNTER — Encounter: Payer: Self-pay | Admitting: Podiatry

## 2016-03-28 ENCOUNTER — Ambulatory Visit (INDEPENDENT_AMBULATORY_CARE_PROVIDER_SITE_OTHER): Payer: Medicare Other | Admitting: Podiatry

## 2016-03-28 DIAGNOSIS — L84 Corns and callosities: Secondary | ICD-10-CM | POA: Diagnosis not present

## 2016-03-28 DIAGNOSIS — M21622 Bunionette of left foot: Secondary | ICD-10-CM

## 2016-03-28 DIAGNOSIS — D0461 Carcinoma in situ of skin of right upper limb, including shoulder: Secondary | ICD-10-CM | POA: Diagnosis not present

## 2016-03-28 DIAGNOSIS — Z85820 Personal history of malignant melanoma of skin: Secondary | ICD-10-CM | POA: Diagnosis not present

## 2016-03-31 NOTE — Progress Notes (Signed)
Subjective:     Patient ID: [name], female   DOB: [birth year], 70 y.o.   MRN: [id number]  HPI patient presents with chronic lesion plantar aspect left fifth metatarsal that's increasingly tender and responds well to trimming but for short periods of time   Review of Systems     Objective:   Physical Exam Neurovascular status intact muscle strength adequate range of motion within normal limits with patient found to have inflammatory keratotic lesion metatarsal fifth left with plantar flexion of the metatarsal and pain    Assessment:     Chronic inflammatory lesion sub-fifth metatarsal left    Plan:     Reviewed Taylor's bunion consideration long-term for metatarsal head resection. Today I went ahead did deep debridement and will reappoint and we will decide how longer able to gain relief for

## 2016-04-04 DIAGNOSIS — T84216A Breakdown (mechanical) of internal fixation device of vertebrae, initial encounter: Secondary | ICD-10-CM | POA: Diagnosis not present

## 2016-04-04 DIAGNOSIS — Z981 Arthrodesis status: Secondary | ICD-10-CM | POA: Diagnosis not present

## 2016-04-04 DIAGNOSIS — M79601 Pain in right arm: Secondary | ICD-10-CM | POA: Diagnosis not present

## 2016-04-04 DIAGNOSIS — M542 Cervicalgia: Secondary | ICD-10-CM | POA: Diagnosis not present

## 2016-04-04 DIAGNOSIS — M546 Pain in thoracic spine: Secondary | ICD-10-CM | POA: Diagnosis not present

## 2016-04-10 DIAGNOSIS — M542 Cervicalgia: Secondary | ICD-10-CM | POA: Diagnosis not present

## 2016-04-14 ENCOUNTER — Other Ambulatory Visit: Payer: Self-pay | Admitting: *Deleted

## 2016-04-14 DIAGNOSIS — M8580 Other specified disorders of bone density and structure, unspecified site: Secondary | ICD-10-CM

## 2016-04-14 LAB — HM MAMMOGRAPHY: HM Mammogram: NORMAL (ref 0–4)

## 2016-04-15 ENCOUNTER — Encounter: Payer: Self-pay | Admitting: Gynecology

## 2016-04-15 DIAGNOSIS — Z1231 Encounter for screening mammogram for malignant neoplasm of breast: Secondary | ICD-10-CM | POA: Diagnosis not present

## 2016-04-17 DIAGNOSIS — M542 Cervicalgia: Secondary | ICD-10-CM | POA: Diagnosis not present

## 2016-04-21 ENCOUNTER — Ambulatory Visit (INDEPENDENT_AMBULATORY_CARE_PROVIDER_SITE_OTHER): Payer: Medicare Other | Admitting: Family Medicine

## 2016-04-21 ENCOUNTER — Encounter: Payer: Self-pay | Admitting: Family Medicine

## 2016-04-21 VITALS — BP 118/64 | HR 75 | Temp 97.9°F | Resp 17 | Ht 66.0 in | Wt 121.0 lb

## 2016-04-21 DIAGNOSIS — R8299 Other abnormal findings in urine: Secondary | ICD-10-CM | POA: Diagnosis not present

## 2016-04-21 DIAGNOSIS — Z0000 Encounter for general adult medical examination without abnormal findings: Secondary | ICD-10-CM

## 2016-04-21 DIAGNOSIS — E440 Moderate protein-calorie malnutrition: Secondary | ICD-10-CM

## 2016-04-21 DIAGNOSIS — E785 Hyperlipidemia, unspecified: Secondary | ICD-10-CM | POA: Diagnosis not present

## 2016-04-21 DIAGNOSIS — F419 Anxiety disorder, unspecified: Secondary | ICD-10-CM

## 2016-04-21 DIAGNOSIS — I480 Paroxysmal atrial fibrillation: Secondary | ICD-10-CM

## 2016-04-21 DIAGNOSIS — R350 Frequency of micturition: Secondary | ICD-10-CM

## 2016-04-21 DIAGNOSIS — R82998 Other abnormal findings in urine: Secondary | ICD-10-CM

## 2016-04-21 LAB — CBC WITH DIFFERENTIAL/PLATELET
Basophils Absolute: 0 10*3/uL (ref 0.0–0.1)
Basophils Relative: 0.6 % (ref 0.0–3.0)
Eosinophils Absolute: 0.2 10*3/uL (ref 0.0–0.7)
Eosinophils Relative: 4.5 % (ref 0.0–5.0)
HCT: 39.3 % (ref 36.0–46.0)
Hemoglobin: 13.4 g/dL (ref 12.0–15.0)
Lymphocytes Relative: 26 % (ref 12.0–46.0)
Lymphs Abs: 1.1 10*3/uL (ref 0.7–4.0)
MCHC: 34.2 g/dL (ref 30.0–36.0)
MCV: 91.5 fl (ref 78.0–100.0)
Monocytes Absolute: 0.3 10*3/uL (ref 0.1–1.0)
Monocytes Relative: 8 % (ref 3.0–12.0)
Neutro Abs: 2.6 10*3/uL (ref 1.4–7.7)
Neutrophils Relative %: 60.9 % (ref 43.0–77.0)
Platelets: 175 10*3/uL (ref 150.0–400.0)
RBC: 4.3 Mil/uL (ref 3.87–5.11)
RDW: 13.7 % (ref 11.5–15.5)
WBC: 4.3 10*3/uL (ref 4.0–10.5)

## 2016-04-21 LAB — POCT URINALYSIS DIPSTICK
Bilirubin, UA: NEGATIVE
Glucose, UA: NEGATIVE
Ketones, UA: NEGATIVE
Nitrite, UA: NEGATIVE
Protein, UA: NEGATIVE
Spec Grav, UA: 1.01
Urobilinogen, UA: 0.2
pH, UA: 6

## 2016-04-21 LAB — BASIC METABOLIC PANEL
BUN: 15 mg/dL (ref 6–23)
CO2: 31 mEq/L (ref 19–32)
Calcium: 9.5 mg/dL (ref 8.4–10.5)
Chloride: 105 mEq/L (ref 96–112)
Creatinine, Ser: 0.6 mg/dL (ref 0.40–1.20)
GFR: 104.53 mL/min (ref >60.00)
Glucose, Bld: 83 mg/dL (ref 70–99)
Potassium: 4.3 mEq/L (ref 3.5–5.1)
Sodium: 143 mEq/L (ref 135–145)

## 2016-04-21 LAB — HEPATIC FUNCTION PANEL
ALT: 13 U/L (ref 0–35)
AST: 18 U/L (ref 0–37)
Albumin: 4.5 g/dL (ref 3.5–5.2)
Alkaline Phosphatase: 67 U/L (ref 39–117)
Bilirubin, Direct: 0.2 mg/dL (ref 0.0–0.3)
Total Bilirubin: 0.9 mg/dL (ref 0.2–1.2)
Total Protein: 7 g/dL (ref 6.0–8.3)

## 2016-04-21 LAB — LIPID PANEL
Cholesterol: 214 mg/dL — ABNORMAL HIGH (ref 0–200)
HDL: 86.6 mg/dL (ref >39.00)
LDL Cholesterol: 111 mg/dL — ABNORMAL HIGH (ref 0–99)
NonHDL: 127.49
Total CHOL/HDL Ratio: 2
Triglycerides: 81 mg/dL (ref 0.0–149.0)
VLDL: 16.2 mg/dL (ref 0.0–40.0)

## 2016-04-21 LAB — TSH: TSH: 2.16 u[IU]/mL (ref 0.35–4.50)

## 2016-04-21 NOTE — Assessment & Plan Note (Signed)
Pt's PE unchanged from previous.  UTD on colonoscopy, mammo.  No need for pap.  Written screening schedule updated and given to pt.  Pt declined flu shot today- plans to reschedule.  Check labs.  Anticipatory guidance provided.

## 2016-04-21 NOTE — Progress Notes (Signed)
   Subjective:    Patient ID: [name], female    DOB: 06-29-1946, 70 y.o.   MRN: [id number]  HPI Here today for CPE.  Risk Factors: Afib- chronic problem, following w/ Cards- Dr Wynonia Lawman.  On ASA for anticoagulation.  On beta blocker- Coreg.  Has a script for Metoprolol to use PRN palpitations.  Takes Lasix prn edema Denies CP, SOB, palpitations, edema. Hyperlipidemia- chronic problem but this is misleading b/c her HDL has been very high in the past.  Denies abd pain, N/V, myalgias Protein Calorie Malnutrition- pt's weight is stable at 121 but unfortunately she has not gained Urine frequency- pt is concerned she has UTI Physical Activity: limited due to extensive back and neck surgery.  Recently started PT. Fall Risk: low Depression: ongoing issue w/ both depression and anxiety.  Now seeing psych and in counseling.  On Lexapro. Hearing: decreased to conversational tones and whispered voice ADL's: independent Cognitive: normal linear thought process, memory and attention intact Home Safety: safe at home, lives alone Height, Weight, BMI, Visual Acuity: see vitals, vision corrected to 20/20 w/ glasses Counseling: UTD on colonoscopy (Dr Earlean Shawl- due 2020), UTD on mammo (done last week).  Declines flu today  Care team reviewed and updated Labs Ordered: See A&P Care Plan: See A&P    Review of Systems Patient reports no vision/ hearing changes, adenopathy,fever, weight change,  persistant/recurrent hoarseness , swallowing issues, chest pain, palpitations, edema, persistant/recurrent cough, hemoptysis, dyspnea (rest/exertional/paroxysmal nocturnal), gastrointestinal bleeding (melena, rectal bleeding), abdominal pain, significant heartburn, bowel changes, GU symptoms (dysuria, hematuria, incontinence), Gyn symptoms (abnormal  bleeding, pain),  syncope, focal weakness, memory loss, skin/hair/nail changes, abnormal bruising or bleeding.   + nosebleeds in winter + chronic pain + neuropathy s/p  back surgery     Objective:   Physical Exam General Appearance:    Alert, cooperative, no distress, appears stated age, very thin  Head:    Normocephalic, without obvious abnormality, atraumatic  Eyes:    PERRL, conjunctiva/corneas clear, EOM's intact, fundi    benign, both eyes  Ears:    Normal TM's and external ear canals, both ears  Nose:   Nares normal, septum midline, mucosa normal, no drainage    or sinus tenderness  Throat:   Lips, mucosa, and tongue normal; teeth and gums normal  Neck:   Symmetrical, trachea midline, no adenopathy;    Thyroid: no enlargement/tenderness/nodules  Back:     Symmetric, no curvature, ROM normal, no CVA tenderness  Lungs:     Clear to auscultation bilaterally, respirations unlabored  Chest Wall:    No tenderness or deformity   Heart:    Regular rate and rhythm, S1 and S2 normal, no murmur, rub   or gallop  Breast Exam:    Deferred to GYN  Abdomen:     Soft, non-tender, bowel sounds active all four quadrants,    no masses, no organomegaly  Genitalia:    Deferred to GYN  Rectal:    Extremities:   Extremities normal, atraumatic, no cyanosis or edema  Pulses:   2+ and symmetric all extremities  Skin:   Skin color, texture, turgor normal, no rashes or lesions  Lymph nodes:   Cervical, supraclavicular, and axillary nodes normal  Neurologic:   CNII-XII intact          Assessment & Plan:

## 2016-04-21 NOTE — Assessment & Plan Note (Signed)
Chronic problem.  Not currently on statin.  Total cholesterol is misleading due to very high HDL.  Check labs.  Adjust tx plan prn.

## 2016-04-21 NOTE — Assessment & Plan Note (Signed)
Ongoing issue for pt.  She remains very thin.  States she is eating regularly.  Weight is stable.  Encouraged her to drink protein shakes for added nutrition.  Will follow.

## 2016-04-21 NOTE — Assessment & Plan Note (Signed)
Chronic problem.  Normal S1/S2 today.  Asymptomatic.  Continues to follow w/ Dr Wynonia Lawman.  On Coreg BID and will take Metoprolol as needed for palpitations.  Will continue to follow.

## 2016-04-21 NOTE — Assessment & Plan Note (Signed)
Chronic problem.  Pt has finally started to see a psychiatrist to help w/ her ongoing issues.  Currently on SSRI and alprazolam prn.  Will follow along and assist as able but I told her that I would no longer prescribe benzos or other psychiatric medications since she has a specialist for this.  Pt expressed understanding and is in agreement w/ plan.

## 2016-04-21 NOTE — Patient Instructions (Addendum)
Follow up in 6 months to recheck BP and cholesterol Schedule a nurse visit for your flu shot We'll notify you of your lab results and make any changes if needed Once we have your urine culture results, we'll determine if we need to start antibiotics Continue to make sure you are eating regularly Exercise as you are able You are up to date on colonoscopy w/ Dr Earlean Shawl, mammo, and immunizations- yay!!! Call with any questions or concerns Hang in there!!!

## 2016-04-21 NOTE — Progress Notes (Signed)
Pre visit review using our clinic review tool, if applicable. No additional management support is needed unless otherwise documented below in the visit note. 

## 2016-04-22 ENCOUNTER — Encounter: Payer: Self-pay | Admitting: General Practice

## 2016-04-22 LAB — URINE CULTURE

## 2016-04-24 DIAGNOSIS — Z87891 Personal history of nicotine dependence: Secondary | ICD-10-CM | POA: Diagnosis not present

## 2016-04-24 DIAGNOSIS — M549 Dorsalgia, unspecified: Secondary | ICD-10-CM | POA: Diagnosis not present

## 2016-04-24 DIAGNOSIS — M25522 Pain in left elbow: Secondary | ICD-10-CM | POA: Diagnosis not present

## 2016-04-24 DIAGNOSIS — G8929 Other chronic pain: Secondary | ICD-10-CM | POA: Diagnosis not present

## 2016-04-24 DIAGNOSIS — Z9889 Other specified postprocedural states: Secondary | ICD-10-CM | POA: Diagnosis not present

## 2016-04-24 DIAGNOSIS — M545 Low back pain: Secondary | ICD-10-CM | POA: Diagnosis not present

## 2016-04-24 DIAGNOSIS — M25511 Pain in right shoulder: Secondary | ICD-10-CM | POA: Diagnosis not present

## 2016-04-24 DIAGNOSIS — F418 Other specified anxiety disorders: Secondary | ICD-10-CM | POA: Diagnosis not present

## 2016-04-24 DIAGNOSIS — M25512 Pain in left shoulder: Secondary | ICD-10-CM | POA: Diagnosis not present

## 2016-04-24 DIAGNOSIS — Z981 Arthrodesis status: Secondary | ICD-10-CM | POA: Diagnosis not present

## 2016-04-29 DIAGNOSIS — M542 Cervicalgia: Secondary | ICD-10-CM | POA: Diagnosis not present

## 2016-05-01 DIAGNOSIS — Z79891 Long term (current) use of opiate analgesic: Secondary | ICD-10-CM | POA: Diagnosis not present

## 2016-05-01 DIAGNOSIS — M961 Postlaminectomy syndrome, not elsewhere classified: Secondary | ICD-10-CM | POA: Diagnosis not present

## 2016-05-01 DIAGNOSIS — G894 Chronic pain syndrome: Secondary | ICD-10-CM | POA: Diagnosis not present

## 2016-05-01 DIAGNOSIS — M4693 Unspecified inflammatory spondylopathy, cervicothoracic region: Secondary | ICD-10-CM | POA: Diagnosis not present

## 2016-05-02 DIAGNOSIS — Z23 Encounter for immunization: Secondary | ICD-10-CM | POA: Diagnosis not present

## 2016-05-06 DIAGNOSIS — Z79899 Other long term (current) drug therapy: Secondary | ICD-10-CM | POA: Diagnosis not present

## 2016-05-06 DIAGNOSIS — Z883 Allergy status to other anti-infective agents status: Secondary | ICD-10-CM | POA: Diagnosis not present

## 2016-05-06 DIAGNOSIS — I4891 Unspecified atrial fibrillation: Secondary | ICD-10-CM | POA: Diagnosis not present

## 2016-05-06 DIAGNOSIS — G609 Hereditary and idiopathic neuropathy, unspecified: Secondary | ICD-10-CM | POA: Diagnosis not present

## 2016-05-06 DIAGNOSIS — F411 Generalized anxiety disorder: Secondary | ICD-10-CM | POA: Diagnosis not present

## 2016-05-06 DIAGNOSIS — K219 Gastro-esophageal reflux disease without esophagitis: Secondary | ICD-10-CM | POA: Diagnosis not present

## 2016-05-06 DIAGNOSIS — Z882 Allergy status to sulfonamides status: Secondary | ICD-10-CM | POA: Diagnosis not present

## 2016-05-06 DIAGNOSIS — R002 Palpitations: Secondary | ICD-10-CM | POA: Diagnosis not present

## 2016-05-06 DIAGNOSIS — Z886 Allergy status to analgesic agent status: Secondary | ICD-10-CM | POA: Diagnosis not present

## 2016-05-06 DIAGNOSIS — Z91040 Latex allergy status: Secondary | ICD-10-CM | POA: Diagnosis not present

## 2016-05-06 DIAGNOSIS — F418 Other specified anxiety disorders: Secondary | ICD-10-CM | POA: Diagnosis not present

## 2016-05-06 DIAGNOSIS — F329 Major depressive disorder, single episode, unspecified: Secondary | ICD-10-CM | POA: Diagnosis not present

## 2016-05-06 DIAGNOSIS — Z87891 Personal history of nicotine dependence: Secondary | ICD-10-CM | POA: Diagnosis not present

## 2016-05-06 DIAGNOSIS — I088 Other rheumatic multiple valve diseases: Secondary | ICD-10-CM | POA: Diagnosis not present

## 2016-05-06 DIAGNOSIS — I480 Paroxysmal atrial fibrillation: Secondary | ICD-10-CM | POA: Diagnosis not present

## 2016-05-06 DIAGNOSIS — R000 Tachycardia, unspecified: Secondary | ICD-10-CM | POA: Diagnosis not present

## 2016-05-06 DIAGNOSIS — F17210 Nicotine dependence, cigarettes, uncomplicated: Secondary | ICD-10-CM | POA: Diagnosis not present

## 2016-05-06 DIAGNOSIS — Z681 Body mass index (BMI) 19 or less, adult: Secondary | ICD-10-CM | POA: Diagnosis not present

## 2016-05-06 DIAGNOSIS — I340 Nonrheumatic mitral (valve) insufficiency: Secondary | ICD-10-CM | POA: Diagnosis not present

## 2016-05-06 DIAGNOSIS — M549 Dorsalgia, unspecified: Secondary | ICD-10-CM | POA: Diagnosis not present

## 2016-05-06 DIAGNOSIS — R079 Chest pain, unspecified: Secondary | ICD-10-CM | POA: Diagnosis not present

## 2016-05-06 DIAGNOSIS — E46 Unspecified protein-calorie malnutrition: Secondary | ICD-10-CM | POA: Diagnosis not present

## 2016-05-06 DIAGNOSIS — Z7982 Long term (current) use of aspirin: Secondary | ICD-10-CM | POA: Diagnosis not present

## 2016-05-06 DIAGNOSIS — E785 Hyperlipidemia, unspecified: Secondary | ICD-10-CM | POA: Diagnosis not present

## 2016-05-06 DIAGNOSIS — I517 Cardiomegaly: Secondary | ICD-10-CM | POA: Diagnosis not present

## 2016-05-07 DIAGNOSIS — F411 Generalized anxiety disorder: Secondary | ICD-10-CM | POA: Diagnosis not present

## 2016-05-07 DIAGNOSIS — I4891 Unspecified atrial fibrillation: Secondary | ICD-10-CM | POA: Diagnosis not present

## 2016-05-07 DIAGNOSIS — E46 Unspecified protein-calorie malnutrition: Secondary | ICD-10-CM | POA: Diagnosis not present

## 2016-05-07 DIAGNOSIS — E785 Hyperlipidemia, unspecified: Secondary | ICD-10-CM | POA: Diagnosis not present

## 2016-05-07 DIAGNOSIS — I340 Nonrheumatic mitral (valve) insufficiency: Secondary | ICD-10-CM | POA: Diagnosis not present

## 2016-05-09 ENCOUNTER — Telehealth: Payer: Self-pay | Admitting: Physician Assistant

## 2016-05-09 ENCOUNTER — Telehealth: Payer: Self-pay | Admitting: Internal Medicine

## 2016-05-09 NOTE — Telephone Encounter (Signed)
Spoke to patient. She will be a new patient to our practice and has just been released from hospital ([facility] in [place]). She was diagnosed w new atrial fibrillation and started on Xarelto. Also changed from coreg to diltiazem.  She is set up to see Rhonda on Monday the 6th (60 min appt).   Notes she's been on both since hospitalization, encountered no issues, but had bleeding symptoms 2 days ago (nosebleed) and last night (gums).  We discussed these, and she notes nosebleed was controlled within 20-25 mins, & was a slow flow of blood. She pinched nose & it eventually resolved. The gum bleeding was minimal and occurred with brushing her teeth. She has an upcoming dental appt for cleaning, and I gave recommendations regarding medication continuation for this.  Advised likely we would not interrupt Xarelto. I informed her that blood thinners across the board carry inherent bleed risks but that we usually weigh the risks vs benefits of this therapy. Advised that I am unsure if switching the medication will help, and unsure if advised to hold med. Informed her I would route for review and follow up with her today. Pt was agreeable to this. She spoke regarding her anxiety and states this was alleviated with our conversation.  Length of call ~15 mins.

## 2016-05-09 NOTE — Telephone Encounter (Signed)
Would advise to continue without interruption.  Flossing regularly will help with the gum bleeding.  Can use nasal saline for a few days to keep sinuses from drying out if nosebleeds return.  Can review with [name] on Monday or go to ER if bleeding persistent

## 2016-05-09 NOTE — Telephone Encounter (Signed)
Recommendations communicated to patient, who voiced understanding and thanks. 

## 2016-05-09 NOTE — Telephone Encounter (Signed)
Poke with patient re: afib.  I reiterated earlier discussion (see messages).  Overall she feels OK, just that her heart is out of rhythm.  Advised to continue medications as prescribed, call if any worsening symptoms, and follow up as scheduled monday

## 2016-05-09 NOTE — Telephone Encounter (Signed)
Agree 

## 2016-05-09 NOTE — Telephone Encounter (Signed)
Pt is currently taking a blood thinner medication started on 05/07/16. Since then she had a nose bleed and last night when brushing her teeth her gums started to bleed. She is worried and wanted to see if she can be seen today.

## 2016-05-10 DIAGNOSIS — M25511 Pain in right shoulder: Secondary | ICD-10-CM | POA: Diagnosis not present

## 2016-05-12 ENCOUNTER — Telehealth: Payer: Self-pay | Admitting: Physician Assistant

## 2016-05-12 ENCOUNTER — Ambulatory Visit (INDEPENDENT_AMBULATORY_CARE_PROVIDER_SITE_OTHER): Payer: Medicare Other | Admitting: Physician Assistant

## 2016-05-12 ENCOUNTER — Encounter: Payer: Self-pay | Admitting: Physician Assistant

## 2016-05-12 VITALS — BP 122/80 | HR 73 | Ht 66.0 in | Wt 122.4 lb

## 2016-05-12 DIAGNOSIS — I480 Paroxysmal atrial fibrillation: Secondary | ICD-10-CM | POA: Diagnosis not present

## 2016-05-12 DIAGNOSIS — Z7901 Long term (current) use of anticoagulants: Secondary | ICD-10-CM

## 2016-05-12 NOTE — Patient Instructions (Signed)
Medication Instructions:  NO CHANGES  If you need a refill on your cardiac medications before your next appointment, please call your pharmacy.   Labwork: NO NEW ORDERS LAST THYROID TEST WAS NORMAL   Follow-Up: Your physician recommends that you schedule a follow-up appointment in: 3 MONTHS WITH DR Oran Rein   Any Other Special Instructions Will Be Listed Below (If Applicable). AVOID CAFFEINE AND MULTI-SYMPTOM COLD RELIEVERS WITH DECONGESTANTS  USE SALINE NASAL SPRAY TWICE DAILY

## 2016-05-12 NOTE — Telephone Encounter (Signed)
Returned call to patient she stated she saw Rosaria Ferries PA this morning.Stated she wants to make sure Diltiazem will control her fast heart beat.Stated she is very anxious.Patient reassured.Advised to monitor heart rate and B/P.Stated she was told Dr.Tilley probably will not be back in 12/17.Appointment scheduled with Rosaria Ferries PA 06/09/16 at 11:00 am.Advised to bring B/P and pulse readings to appointment.Advised to call sooner if needed.

## 2016-05-12 NOTE — Progress Notes (Signed)
Cardiology Office Note   Date:  05/12/2016   ID:  [name], [name] 1946/04/03, MRN [id number]  PCP:  [name], MD  Cardiologist:  Dr Johny Blamer, PA-C   Chief Complaint  Patient presents with  . Hospitalization Follow-up    pt when to [facility] hospital for Afib    History of Present Illness: [name] is a 70 y.o. female with a history of PAF previously followed by Dr Wynonia Lawman (10/2014), BPPV, anxiety and panic d/o, intermittent claudication w/ no imaging results in system, nl MV 2014, EF nl by echo 2016.   Recent admit to [facility] in [place], placed on Xarelto and Dilt, Coreg and ASA d/c'd. Pt has Lopressor to take prn, but has not taken it.  [name] presents for follow up of her atrial fibrillation. Her sister is here with her today.   [name] has a list of questions and concerns. Among them (15 total) 1. Concerns about bleeding risk - addressed 2. She had a nosebleed, when to come to hospital - only if you cannot get it stopped 3. Should she come to the hospital if she goes back into afib? Only if you feel bad or your heart rate is too high/low, less than 110 is ok 4. Ok to get teeth cleaned? Yes 5. Ok to climb stairs? Yes 6. Ok to do PT? Yes 7. Is there an antidote for Xarelto? No 8. Can she take a lower dose of Xarelto? No 9. Can she have regular coffee? Best to stick to decaf 10. What can she take for pain? Oxycodone ok? Tylenol or oxy is ok, no NSAIDS 11. If they want to stop the Xarelto for a procedure, is that ok? Yes, but wait till you have been in SR for a month.  She has not had Palpitations or felt like she was back in atrial fibrillation. She has not had chest pain or shortness of breath. She had a mild nosebleed that stopped after a few minutes and her gums bled a little bit when she was brushing her teeth once. No other bleeding issues or incidents.  She is trying to eat a little more as she is supposed to gain about 10 pounds. She  admits that anxiety is a problem for her. She is on some medication for this, and will continue it. She is concerned about driving longer distances because of her musculoskeletal issues. She feels the [facility] offices easier for her to get to, but Dr. Wynonia Lawman does not come here. She is advised to discuss this with him.   Past Medical History:  Diagnosis Date  . Allergy   . Anxiety   . Benign paroxysmal positional vertigo 06/08/2013  . Chronic a-fib ([place]) 08/07/2014  . Fracture of multiple ribs   . GERD (gastroesophageal reflux disease)   . Hair loss 04/12/2012  . Herpes   . IC (intermittent claudication) ([place])   . Interstitial cystitis 11/06/2011  . Kidney stone   . Melanoma ([place])   . 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 Osteopenia 02/18/2012  . PAF (paroxysmal atrial fibrillation) ([place]) 2012  . Peripheral neuropathy ([place]) 11/06/2011  . PMDD (premenstrual dysphoric disorder)   . Rotator cuff tear   . Vaginal delivery    ONE NSVD  . 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 BACK SURGERY     DISC FUSION L5,L6,L7  . BLADDER SURGERY     Bladder sling  .  BREAST SURGERY     BREAST BIOPSY--RIGHT BENIGN  . BUNIONECTOMY    . HEAD & NECK SKIN LESION EXCISIONAL BIOPSY     melanoma  . NECK FUSION    . ROTATOR CUFF REPAIR  2012   RIGHT  . SPINAL FUSION  dec 2015   . VAGINAL HYSTERECTOMY     [facility]    Current Outpatient Prescriptions  Medication Sig Dispense Refill  . acetaminophen (TYLENOL) 500 MG tablet Take 1,000 mg by mouth every 6 (six) hours as needed for mild pain.     . Calcium-Vitamin D-Vitamin K (VIACTIV PO) Take by mouth.    . Cholecalciferol (VITAMIN D3) 2000 units capsule Take 1 capsule by mouth daily.     . clonazePAM (KLONOPIN) 0.5 MG tablet Take 0.5 tablets (0.25 mg total) by mouth 2 (two) times daily as needed for anxiety. 60 tablet 1  . diltiazem (CARDIZEM CD) 120 MG 24 hr capsule Take 1 capsule by mouth daily.    [name] escitalopram (LEXAPRO) 5 MG  tablet Take 1 tablet (5 mg total) by mouth daily. 30 tablet 0  . fluticasone (FLONASE) 50 MCG/ACT nasal spray Place 2 sprays into both nostrils daily. 16 g 6  . furosemide (LASIX) 20 MG tablet Take 1 tablet (20 mg total) by mouth daily. 30 tablet 1  . loratadine (CLARITIN) 10 MG tablet Take 10 mg by mouth daily as needed.     . meclizine (ANTIVERT) 12.5 MG tablet TAKE ONE TABLET BY MOUTH THREE TIMES DAILY AS NEEDED FOR DIZZINESS 15 tablet 0  . metoprolol tartrate (LOPRESSOR) 25 MG tablet     . ondansetron (ZOFRAN) 4 MG tablet TAKE ONE TABLET BY MOUTH EVERY 8 HOURS AS NEEDED FOR NAUSEA OR VOMITING 20 tablet 3  . oxyCODONE (ROXICODONE) 5 MG/5ML solution 5-10 mls every 4-6 hours    . polyethylene glycol powder (GLYCOLAX/MIRALAX) powder     . Probiotic Product (RESTORA PO) Take by mouth.    . rivaroxaban (XARELTO) 20 MG TABS tablet Take 1 tablet by mouth daily.    . valACYclovir (VALTREX) 500 MG tablet as needed.      No current facility-administered medications for this visit.     Allergies:   Contrast media [iodinated diagnostic agents]; Erythromycin; Latex; Levofloxacin; Other; Oxycodone; Septra [bactrim]; Sulfa antibiotics; and Morphine    Social History:  The patient  reports that she quit smoking about 31 years ago. Her smoking use included Cigarettes. She has a 3.50 pack-year smoking history. She has never used smokeless tobacco. She reports that she does not drink alcohol or use drugs.   Family History:  The patient's family history includes Arthritis in her mother; Cancer in her mother; Diabetes in her brother, brother, and father; Heart Problems in her brother; Heart disease in her mother, sister, and sister; Hyperlipidemia in her sister and sister; Stroke in her sister.    ROS:  Please see the history of present illness. All other systems are reviewed and negative.    PHYSICAL EXAM: VS:  BP 122/80   Pulse 73   Ht 5\' 6"  (1.676 m)   Wt 122 lb 6.4 oz (55.5 kg)   BMI 19.76 kg/m  ,  BMI Body mass index is 19.76 kg/m. GEN: Well nourished, well developed, female in no acute distress  HEENT: normal for age  Neck: no JVD, no carotid bruit, no masses Cardiac: RRR; 2/6 murmur, no rubs, or gallops Respiratory: Decreased breath sounds bases but clear to auscultation bilaterally, normal work of breathing  GI: soft, nontender, nondistended, + BS Regina: no deformity or atrophy; no edema; distal pulses are 2+ in all 4 extremities   Skin: warm and dry, no rash Neuro:  Strength and sensation are intact Psych: euthymic mood, full affect   EKG:  EKG is ordered today. The ekg ordered today demonstrates sinus rhythm, rate 72 bpm, intervals within normal limits, no acute ischemic changes.  Previous cardiac testing:  1. Event monitor February 2012  2. Echocardiogram 08/2014 - Left ventricle: The cavity size was normal. Systolic function wasnormal. The estimated ejection fraction was in the range of 50%to 55%. Wall motion was normal; there were no regional wallmotion abnormalities. - Pericardium, extracardiac: A trivial pericardial effusion wasidentified. 3. Treadmill Myoview March 2014;  LVEF of 82%, no ischemia  Recent Labs: 04/21/2016: ALT 13; BUN 15; Creatinine, Ser 0.60; Hemoglobin 13.4; Platelets 175.0; Potassium 4.3; Sodium 143; TSH 2.16    Lipid Panel    Component Value Date/Time   CHOL 214 (H) 04/21/2016 0947   TRIG 81.0 04/21/2016 0947   HDL 86.60 04/21/2016 0947   CHOLHDL 2 04/21/2016 0947   VLDL 16.2 04/21/2016 0947   LDLCALC 111 (H) 04/21/2016 0947   LDLDIRECT 136.5 06/08/2013 1049     Wt Readings from Last 3 Encounters:  05/12/16 122 lb 6.4 oz (55.5 kg)  04/21/16 121 lb (54.9 kg)  02/13/16 120 lb 8 oz (54.7 kg)     Other studies Reviewed: Additional studies/ records that were reviewed today include: Hospital records, Office notes and other available records.  ASSESSMENT AND PLAN:  1.  PAF: See list of questions and concerns above. If she has had any  additional atrial fibrillation, she is not aware of it. She is compliant with the diltiazem. No med changes. Her heart rate and blood pressure well controlled. She has metoprolol tablets to take on a when necessary basis.  2. Chronic anticoagulation: She has had some minor bleeding issues, but in general is tolerating the Xarelto well. We discussed the risks/benefit ratio several different times during her visit, she is to continue to monitor herself for symptoms and let us know if she has any issues or concerns.   Current medicines are reviewed at length with the patient today.  The patient has concerns regarding medicines. Concerns were addressed  The following changes have been made:  no change  Labs/ tests ordered today include:   Orders Placed This Encounter  Procedures  . EKG 12-Lead     Disposition:   FU with Dr. Wynonia Lawman  Signed, Rosaria Ferries, PA-C  05/12/2016 11:42 AM    [place] Phone: [phone]; Fax: [phone]  This note was written with the assistance of speech recognition software. Please excuse any transcriptional errors.

## 2016-05-12 NOTE — Telephone Encounter (Signed)
New message   Pt needs to be called back  Thinks she in afib since appt this am's appt

## 2016-05-13 ENCOUNTER — Telehealth: Payer: Self-pay | Admitting: Physician Assistant

## 2016-05-13 MED ORDER — METOPROLOL TARTRATE 25 MG PO TABS: 25.0000 mg | ORAL_TABLET | Freq: Two times a day (BID) | ORAL | 11 refills | Status: DC

## 2016-05-13 NOTE — Telephone Encounter (Signed)
New message       Pt c/o BP issue: STAT if pt c/o blurred vision, one-sided weakness or slurred speech  1. What are your last 5 BP readings? 144/85 HR 89, 158/87 HR 89, 160/84 HR 89 2. Are you having any other symptoms (ex. Dizziness, headache, blurred vision, passed out)? Fast heart rate 3. What is your BP issue?  Pt states her bp is high.  Pt said she had a cortisone shot sat am, could her bp be elevated because of the cortisone shot?

## 2016-05-13 NOTE — Telephone Encounter (Signed)
Spoke to patient and communicated recommendations.   We discussed metoprolol usage instructions, as well as potential side effects in detail. She's aware that she may feel more sluggish or fatigued when starting this medication but to call if she has other unexpected problems. At pt request Rx sent as fill later. I did confirm instructions on cutting pills for the first few days.  Patient will call back with her BP readings early next week and we will reassess on whether she should move to higher dose at that time. She voiced thanks for call & understanding of all advice given. No further questions at this time. Patient was specific to mention her thanks for the prompt follow up.

## 2016-05-13 NOTE — Telephone Encounter (Signed)
Please let her know she should start taking the metoprolol 25 mg, 1/2 tab bid. Previous dose was prn only for palpitations or elevated HR. If this dose is not high enough, after a few days, take 25 mg bid. Still ok to take an extra 1/2 to 1 tablet daily as needed for elevated BP/HR. Please send in new rx as 1 tablet bid Thank you

## 2016-05-13 NOTE — Telephone Encounter (Signed)
Pt of Dr. Wynonia Lawman Seen by Rosaria Ferries yesterday.  Returned call. States a fib is 'keeping me nervous' States her palpitations are very obvious when she lies on her back, has trouble sleeping supine because of this.  'i'm not relaxing any'. She's currently on diltiazem 120mg . NOT taking metoprolol, though it is on her med list (seeking clarification on this because instructions for use are not indicated).  notes prior to the diltiazem, when she was on carvedilol, she felt her BP was better controlled.  she notes high BPs last night and this AM. [id number] systolic. She has checked these repeatedly this AM - 6-7x. I had a lengthy conversation with her about limiting freq of checks to 1-2 times daily. She will plan to follow BPs in this manner for several days, and get back to [place] on her trends  she notes "fast HR" in 80s, 90s, "as high as 100" when she is checking her BP. she acknowledges anxiety.   I have given reassurance where able. Patient really wants to know if the diltiazem dose should be increased, as it's been a week since she's been on it and she feels it should be working. Optionally, asks if she should be taking additional medication.  I've let her know I would ask for review of this by [facility].

## 2016-05-16 ENCOUNTER — Telehealth: Payer: Self-pay | Admitting: Physician Assistant

## 2016-05-16 NOTE — Telephone Encounter (Signed)
Advised ok to take Claritin plain

## 2016-05-16 NOTE — Telephone Encounter (Signed)
Pt has atrial fib,she wants to know if she can take the generic for Claritin for her sinus?

## 2016-05-19 ENCOUNTER — Telehealth: Payer: Self-pay | Admitting: Physician Assistant

## 2016-05-19 NOTE — Telephone Encounter (Signed)
Left msg for patient to call & noted I will try to call back later. She was to report BP readings after starting metoprolol last week.

## 2016-05-19 NOTE — Telephone Encounter (Signed)
Returned call and discussed recent BP trends. She notes she is taking the metoprolol at 12.5mg  BID and for last week BPs have been varying between 0000000 systolic. She had one systolic reading of [id number]. Her diastolic has been very consistent at 70-77. HR has been similarly consistent running in upper 60s/low 70s. She was concerned about variability. I gave her some instruction regarding natural variation of BPs, encouraged her to reduce frequency of checks (she had 3 readings from this AM) and discussed keeping a journal of daily or BID readings to review a couple times a month.  I congratulated patient and gave her encouragement that these numbers look very good. She notes she is also experiencing noticeable reduction in palpitations. Informed her I think she can probably stay on current dose of diltiazem and metoprolol but that I would certainly let Suanne Marker review recent trends at her request, and see if any further recommendations are advised at this time. Patient voiced thanks and understanding.

## 2016-05-19 NOTE — Telephone Encounter (Signed)
New Message  Pt voiced nurse Ovid Curd is suppose to call pt back the beginning of this week.  Please f/u with pt

## 2016-05-19 NOTE — Telephone Encounter (Signed)
No med changes. Her BP variation is well within normal diurnal changes we expect during a 24 hour period. Thanks

## 2016-05-20 DIAGNOSIS — M542 Cervicalgia: Secondary | ICD-10-CM | POA: Diagnosis not present

## 2016-05-22 ENCOUNTER — Other Ambulatory Visit: Payer: Self-pay | Admitting: Gynecology

## 2016-05-22 ENCOUNTER — Telehealth: Payer: Self-pay | Admitting: Physician Assistant

## 2016-05-22 ENCOUNTER — Encounter: Payer: Self-pay | Admitting: Gynecology

## 2016-05-22 ENCOUNTER — Ambulatory Visit (INDEPENDENT_AMBULATORY_CARE_PROVIDER_SITE_OTHER): Payer: Medicare Other

## 2016-05-22 DIAGNOSIS — M810 Age-related osteoporosis without current pathological fracture: Secondary | ICD-10-CM

## 2016-05-22 DIAGNOSIS — M8580 Other specified disorders of bone density and structure, unspecified site: Secondary | ICD-10-CM

## 2016-05-22 NOTE — Telephone Encounter (Signed)
Pt is calling for Ovid Curd, stating since she spoke with him on Monday she is still experiencing rapid heart beat she would like to talk about her current medications.

## 2016-05-22 NOTE — Telephone Encounter (Signed)
Sounds exactly right.

## 2016-05-22 NOTE — Telephone Encounter (Signed)
Spoke to patient. She doesn't report rapid rate but still notes her palpitations are evident.  Her BPs & HR consistent as reported earlier in the week. (see 05/19/16 note)  She asked if she should go ahead and increase her metoprolol to 25mg  BID (she's currently taking 1/2 tablets, 12.5mg  BID). Rhonda's original instruction for dosing was to start at the 1/2 tablet and move up to whole tablet after a few days.Pt reports BPs & HR are stable so I advised to go ahead and do so. Patient will plan to call back Monday. I advised that if she still has concerns at that time I think it would be reasonable to see APP for visit. Pt agreeable w this plan and voiced thanks for call. No further needs identified at this time.

## 2016-05-23 ENCOUNTER — Encounter: Payer: Self-pay | Admitting: Family Medicine

## 2016-05-23 ENCOUNTER — Ambulatory Visit (INDEPENDENT_AMBULATORY_CARE_PROVIDER_SITE_OTHER): Payer: Medicare Other | Admitting: Family Medicine

## 2016-05-23 VITALS — BP 124/81 | HR 70 | Temp 98.1°F | Resp 16 | Ht 66.0 in | Wt 121.4 lb

## 2016-05-23 DIAGNOSIS — F419 Anxiety disorder, unspecified: Secondary | ICD-10-CM

## 2016-05-23 DIAGNOSIS — G8928 Other chronic postprocedural pain: Secondary | ICD-10-CM | POA: Diagnosis not present

## 2016-05-23 DIAGNOSIS — I480 Paroxysmal atrial fibrillation: Secondary | ICD-10-CM | POA: Diagnosis not present

## 2016-05-23 MED ORDER — ESCITALOPRAM OXALATE 5 MG PO TABS: 5.0000 mg | ORAL_TABLET | Freq: Every day | ORAL | 3 refills | Status: DC

## 2016-05-23 NOTE — Progress Notes (Signed)
Pre visit review using our clinic review tool, if applicable. No additional management support is needed unless otherwise documented below in the visit note. 

## 2016-05-23 NOTE — Assessment & Plan Note (Signed)
Ongoing issue due to failed neck and back surgeries.  Seeing pain management who told her she would not be able to take opiates while on benzos.  Psych was in agreement and stopped her Clonazepam.  Pt is begging for Clonazepam today- told her in no uncertain terms that I would not fill it and that I am in agreement w/ her other providers that she finally needs a better way to manage her anxiety (we have been round and round on this issue over the years).  Will continue to follow and assist as able

## 2016-05-23 NOTE — Progress Notes (Signed)
   Subjective:    Patient ID: [name], female    DOB: [birth year], 70 y.o.   MRN: [id number]  HPI Hospital f/u- pt was admitted 10/31-11/1 at [facility] for Afib.  They stopped her Coreg and started her on Dilt and Xarelto.  Her ECHO showed moderate tricuspid and mitral regurg.  Has been seeing Rosaria Ferries PA-C at [facility] since her d/c b/c Dr Wynonia Lawman has been out on medical leave.   Metoprolol was increased to 1 tab BID.  On Diltiazem every morning and Xarelto at 5pm.  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  Is seeing pain management.  [name] told her 'to gain 20 lbs' which she says, 'isn't going to happen'.  Pt is tearful, reports she's not able to 'do anything'.  Pt has seen 2 counselors and a psychiatrist- was given Lexapro daily but she stopped the medication. Continues to take clonopin prn (but she says psych will not give that to her)   Review of Systems For ROS see HPI     Objective:   Physical Exam  Constitutional: She is oriented to person, place, and time. She appears well-developed. No distress.  Very thin, older woman  HENT:  Head: Normocephalic and atraumatic.  Eyes: Conjunctivae and EOM are normal. Pupils are equal, round, and reactive to light.  Neurological: She is alert and oriented to person, place, and time.  Skin: Skin is warm and dry.  Psychiatric: Her behavior is normal. Thought content normal.  Very anxious, tearful throughout exam, almost begging for refill on Klonopin  Vitals reviewed.         Assessment & Plan:

## 2016-05-23 NOTE — Assessment & Plan Note (Signed)
Recurrent problem for pt.  Following w/ cardiology.  Reassured her that I am in agreement w/ Dilt and Xarelto in addition to Metoprolol twice daily.  Told her that she's in good hands with her current providers as this has driven her anxiety quite high.  Will follow along and assist as able.

## 2016-05-23 NOTE — Patient Instructions (Signed)
Follow up as scheduled/needed Please call [facility] and schedule 2 appts- 1 with a psychiatrist for medication management and 1 with a counselor to help you adapt to your new normal RESTART the Lexapro 5mg  until you see the psychiatrist I agree with everything that cardiology is doing and you are in good hands! Continue to work on your pain control w/ pain management Call with any questions or concerns Hang in there!  You can do this!!!

## 2016-05-23 NOTE — Assessment & Plan Note (Signed)
Deteriorated.  Pt's recurrence of Afib has sent her anxiety sky rocketing.  She never followed up w/ psych when they told her she would not be able to continue her Clonazepam.  She stopped her Lexapro months ago and has not been to counseling recently.  Stressed that she needed to give the treatment plan a chance b/c her anxiety has been terrible for years- despite use of Clonazepam and it was time for something different.  Told her I would not prescribe additional Clonazepam but would restart the Lexapro while she was waiting on her psych appt.  Will follow along and assist as able.

## 2016-05-26 ENCOUNTER — Telehealth: Payer: Self-pay | Admitting: Family Medicine

## 2016-05-26 NOTE — Telephone Encounter (Signed)
Called and left a detailed message to inform patient that she must TAKE the lexapro until she is seen with Behavioral health on 12/6.

## 2016-05-26 NOTE — Telephone Encounter (Signed)
Pt calling in to let KT know that pt called [facility] and is not able to get an appt with them until 12/6. Pt was told by them to wait until her appt before starting the lexapro so pt is waiting.

## 2016-05-27 ENCOUNTER — Encounter: Payer: Self-pay | Admitting: Physician Assistant

## 2016-05-27 DIAGNOSIS — M542 Cervicalgia: Secondary | ICD-10-CM | POA: Diagnosis not present

## 2016-05-27 NOTE — Progress Notes (Signed)
Cardiology Office Note   Date:  05/28/2016   ID:  [name], [name] 14, 1947, MRN [id number]  PCP:  [name], MD  Cardiologist:  Dr Johny Blamer, PA-C 05/12/2016  Chief Complaint  Patient presents with  . Follow-up    no chest pain, no sob.  . Dizziness    due to medication    History of Present Illness: [name] is a 70 y.o. female with a history of Atrial fib on Xarelto, Cardizem and metoprolol. Hx anxiety, GERD  11/17, seen by [name] and was having problems with atrial fib>>appt made. Atrial fib increases her anxiety, she was to restart Lexapro until seen at [facility] 12/06  [name] presents for evaluation of her palpitations.   She has been told to get counseling for her anxiety. She has appointments with [facility] in [place].   She feels that something is not right when she first gets up in the morning. She will hurry to eat so that she can take her pills. She does not think she is out of rhythm. She will then lie down and relax. She will feel better in a little while. This happens virtually every day.   She has not had palpitations at the severity which resulted in hospitalization 10/31 since then. She did have palpitations when she took the Lexapro one day. She did not seek help, has not had the Lexapro since then.   She dislikes the medication because it makes her tired. Sometimes she feels like a Zombie. She has not missed any doses of the Cardizem or metoprolol.   She is compliant with the Xarelto, not having any bleeding issues.    Past Medical History:  Diagnosis Date  . Allergy   . Anxiety   . Benign paroxysmal positional vertigo 06/08/2013  . Chronic a-fib ([place]) 08/07/2014  . Fracture of multiple ribs   . GERD (gastroesophageal reflux disease)   . Hair loss 04/12/2012  . Herpes   . IC (intermittent claudication) ([place])   . Interstitial cystitis 11/06/2011  . Kidney stone   . 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 Osteopenia 02/18/2012  .  Osteoporosis   . PAF (paroxysmal atrial fibrillation) ([place]) 2012  . Peripheral neuropathy ([place]) 11/06/2011  . PMDD (premenstrual dysphoric disorder)   . Rotator cuff tear   . Vaginal delivery    ONE NSVD  . 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 BACK SURGERY     DISC FUSION L5,L6,L7  . BLADDER SURGERY     Bladder sling  . BREAST SURGERY     BREAST BIOPSY--RIGHT BENIGN  . BUNIONECTOMY    . HEAD & NECK SKIN LESION EXCISIONAL BIOPSY     melanoma  . NECK FUSION    . ROTATOR CUFF REPAIR  2012   RIGHT  . SPINAL FUSION  dec 2015   . VAGINAL HYSTERECTOMY     TVH    Medication Sig  . acetaminophen (TYLENOL) 500 MG tablet Take 1,000 mg by mouth every 6 (six) hours as needed for mild pain.   . Calcium-Vitamin D-Vitamin K (VIACTIV PO) Take by mouth.  . Cholecalciferol (VITAMIN D3) 2000 units capsule Take 1 capsule by mouth daily.   . clonazePAM (KLONOPIN) 0.5 MG tablet Take 0.5 tablets (0.25 mg total) by mouth 2 (two) times daily as needed for anxiety.  [name] diltiazem (CARDIZEM CD) 120 MG 24 hr capsule Take 1 capsule by  mouth daily.  [name] escitalopram (LEXAPRO) 5 MG tablet Take 1 tablet (5 mg total) by mouth daily.  . fluticasone (FLONASE) 50 MCG/ACT nasal spray Place 2 sprays into both nostrils daily.  . furosemide (LASIX) 20 MG tablet Take 1 tablet (20 mg total) by mouth daily.  [name] loratadine (CLARITIN) 10 MG tablet Take 10 mg by mouth daily as needed.   . meclizine (ANTIVERT) 12.5 MG tablet TAKE ONE TABLET BY MOUTH THREE TIMES DAILY AS NEEDED FOR DIZZINESS  . metoprolol tartrate (LOPRESSOR) 25 MG tablet Take 1 tablet (25 mg total) by mouth 2 (two) times daily.  . ondansetron (ZOFRAN) 4 MG tablet TAKE ONE TABLET BY MOUTH EVERY 8 HOURS AS NEEDED FOR NAUSEA OR VOMITING  . oxyCODONE (ROXICODONE) 5 MG/5ML solution 5-10 mls every 4-6 hours  . polyethylene glycol powder (GLYCOLAX/MIRALAX) powder   . Probiotic Product (RESTORA PO) Take by mouth.  . rivaroxaban  (XARELTO) 20 MG TABS tablet Take 1 tablet by mouth daily.  . valACYclovir (VALTREX) 500 MG tablet as needed.    No current facility-administered medications for this visit.     Allergies:   Contrast media [iodinated diagnostic agents]; Erythromycin; Latex; Levofloxacin; Other; Oxycodone; Septra [bactrim]; Sulfa antibiotics; and Morphine    Social History:  The patient  reports that she quit smoking about 31 years ago. Her smoking use included Cigarettes. She has a 3.50 pack-year smoking history. She has never used smokeless tobacco. She reports that she does not drink alcohol or use drugs.   Family History:  The patient's family history includes Arthritis in her mother; Cancer in her mother; Diabetes in her brother, brother, and father; Heart Problems in her brother; Heart disease in her mother, sister, and sister; Hyperlipidemia in her sister and sister; Stroke in her sister.    ROS:  Please see the history of present illness. All other systems are reviewed and negative.    PHYSICAL EXAM: VS:  BP 120/74   Pulse 76   Ht 5\' 6"  (1.676 m)   Wt 120 lb 12.8 oz (54.8 kg)   BMI 19.50 kg/m  , BMI Body mass index is 19.5 kg/m. GEN: Well nourished, well developed, female in no acute distress  HEENT: normal for age  Neck: no JVD, no carotid bruit, no masses Cardiac: RRR; 2/6 murmur, no rubs, or gallops Respiratory:  clear to auscultation bilaterally, normal work of breathing GI: soft, nontender, nondistended, + BS MS: no deformity or atrophy; no edema; distal pulses are 2+ in all 4 extremities   Skin: warm and dry, no rash Neuro:  Strength and sensation are intact Psych: euthymic mood, full affect   EKG:  EKG is ordered today. The ekg ordered today demonstrates SR, no acute ischemic changes, normal intervals   Recent Labs: 04/21/2016: ALT 13; BUN 15; Creatinine, Ser 0.60; Hemoglobin 13.4; Platelets 175.0; Potassium 4.3; Sodium 143; TSH 2.16    Lipid Panel    Component Value  Date/Time   CHOL 214 (H) 04/21/2016 0947   TRIG 81.0 04/21/2016 0947   HDL 86.60 04/21/2016 0947   CHOLHDL 2 04/21/2016 0947   VLDL 16.2 04/21/2016 0947   LDLCALC 111 (H) 04/21/2016 0947   LDLDIRECT 136.5 06/08/2013 1049     Wt Readings from Last 3 Encounters:  05/28/16 120 lb 12.8 oz (54.8 kg)  05/23/16 121 lb 6 oz (55.1 kg)  05/12/16 122 lb 6.4 oz (55.5 kg)     Other studies Reviewed: Additional studies/ records that were reviewed today include: previous office  notes and testing.  ASSESSMENT AND PLAN:  1.  PAF: She is having symptoms, but not necessarily have atrial fib. She is to document her BP and HR in the morning with a column for ?irregular and an anxiety rating 0-10. This will help determine if she is having breakthrough fib. OK to take an extra metoprolol tablet as needed for palpitations or high BP. No indication for antiarrhythmic therapy or referral to afib clinic.  2. Chronic anticoagulation: she will pay > $400 for Xarelto in January, then will meet her deductible and go back to $75/month. She has gotten 30 days for free already, the copay card will not help her. She is determined not to miss any doses, sorry to say no programs to help.   3. Anxiety: she is pursuing treatment of this. She had palpitations on Lexapro, no info on whether they were afib or not. Encouraged her to pursue this.   Current medicines are reviewed at length with the patient today.  The patient has concerns regarding medicines. These were addressed.   The following changes have been made: ok to take extra metoprolol prn  Labs/ tests ordered today include: none No orders of the defined types were placed in this encounter.    Disposition:   FU with Dr Wynonia Lawman as scheduled.  Augusto Garbe  05/28/2016 11:48 AM    [facility] Phone: [phone]; Fax: [phone]  This note was written with the assistance of speech recognition software. Please  excuse any transcriptional errors.

## 2016-05-28 ENCOUNTER — Encounter: Payer: Self-pay | Admitting: Physician Assistant

## 2016-05-28 ENCOUNTER — Ambulatory Visit (INDEPENDENT_AMBULATORY_CARE_PROVIDER_SITE_OTHER): Payer: Medicare Other | Admitting: Physician Assistant

## 2016-05-28 VITALS — BP 120/74 | HR 76 | Ht 66.0 in | Wt 120.8 lb

## 2016-05-28 DIAGNOSIS — Z7901 Long term (current) use of anticoagulants: Secondary | ICD-10-CM | POA: Diagnosis not present

## 2016-05-28 DIAGNOSIS — F418 Other specified anxiety disorders: Secondary | ICD-10-CM | POA: Diagnosis not present

## 2016-05-28 DIAGNOSIS — I480 Paroxysmal atrial fibrillation: Secondary | ICD-10-CM

## 2016-05-28 NOTE — Patient Instructions (Signed)
Medication Instructions: Your physician recommends that you continue on your current medications as directed. Please refer to the Current Medication list given to you today.  Follow-Up: Rosaria Ferries, PA-C recommends that you keep your follow-up appointment with Dr. Wynonia Lawman.  Any Other Special Instructions will be listed below:  --Record Blood Pressure/Heart Rate/Rhythm/Anxiety daily. Take your blood pressure first thing in the morning and then one more time during the day  "I am on Xarelto." "I am not going to have a stroke." "Atrial Fibrillation is not going to kill me." "I do not like Atrial Fibrillation, but I am OK."    If you need a refill on your cardiac medications before your next appointment, please call your pharmacy.

## 2016-05-31 ENCOUNTER — Telehealth: Payer: Self-pay | Admitting: Cardiology

## 2016-05-31 NOTE — Telephone Encounter (Signed)
Received a outpatient call from patient. States she is feeling very anxious this morning. Was concerned that this may be her AF, but took vitals, HR-60s, Bp-128/60. Denies any chest pain, dyspnea, n/v or diaphoresis. Does have some palpitations, and took an extra metoprolol for this. Also has klonopin, but did not take any in the past 2 days. Has an appt with [facility] in a couple of days. I instructed that she proceed with taking her home dose of klonopin 0.25mg  and see if this helps with her symptoms today. Pt was agreeable to this and thanked me for the call back.

## 2016-06-01 ENCOUNTER — Telehealth: Payer: Self-pay | Admitting: Cardiology

## 2016-06-01 NOTE — Telephone Encounter (Signed)
Pt called reporting she is concerned about her AF. I talked with the patient yesterday where she reported feeling anxious, but had a stable HR and BP. I suggested that she try to take her home klonopin to help with her symptoms. She called today stating the klonopin helped but she began to feel bad again yesterday evening.   This morning she is feeling ok, but wants to know when she should to the ED. I gave ER precautions regarding symptoms for AF. She reports her HR has maintained in the 70s/80s, and last blood pressure was 133/81. Her next appt is on 12/4 with Rosaria Ferries PA-C. States she is concerned this is to far away. I instructed if she feels well today, to wait and call the office for sooner appt, if symptoms return she should proceed to the ED. She was agreeable to this plan and thanked me for the call back.

## 2016-06-02 ENCOUNTER — Encounter: Payer: Self-pay | Admitting: Physician Assistant

## 2016-06-02 ENCOUNTER — Ambulatory Visit (INDEPENDENT_AMBULATORY_CARE_PROVIDER_SITE_OTHER): Payer: Medicare Other | Admitting: Physician Assistant

## 2016-06-02 ENCOUNTER — Telehealth: Payer: Self-pay | Admitting: Physician Assistant

## 2016-06-02 VITALS — BP 110/62 | HR 73 | Ht 66.0 in | Wt 120.0 lb

## 2016-06-02 DIAGNOSIS — F419 Anxiety disorder, unspecified: Secondary | ICD-10-CM | POA: Diagnosis not present

## 2016-06-02 DIAGNOSIS — I480 Paroxysmal atrial fibrillation: Secondary | ICD-10-CM

## 2016-06-02 DIAGNOSIS — M542 Cervicalgia: Secondary | ICD-10-CM

## 2016-06-02 DIAGNOSIS — G8929 Other chronic pain: Secondary | ICD-10-CM | POA: Diagnosis not present

## 2016-06-02 NOTE — Patient Instructions (Signed)
Medication Instructions:  Your physician recommends that you continue on your current medications as directed. Please refer to the Current Medication list given to you today.  Labwork: None   Testing/Procedures: None   Follow-Up: Your physician recommends that you schedule a follow-up appointment in: [place]  Any Other Special Instructions Will Be Listed Below (If Applicable).     If you need a refill on your cardiac medications before your next appointment, please call your pharmacy.

## 2016-06-02 NOTE — Telephone Encounter (Signed)
Returned call to patient.She stated she has been having Afib for the past 4 days.Stated she took a extra metoprolol and it affected her speech.Stated she just feels bad and would like to be seen.Appointment scheduled with Almyra Deforest PA today at 11:30 am.

## 2016-06-02 NOTE — Telephone Encounter (Signed)
New message    Patient calling C/O Afib every day since last Wednesday.    Patient states she called the on-call APP / no emergency room visit.

## 2016-06-02 NOTE — Progress Notes (Signed)
Cardiology Office Note    Date:  06/02/2016   ID:  Regina, [name] 1946-01-20, MRN [id number]  PCP:  [name], MD  Cardiologist:  Dr. Wynonia Lawman  Chief Complaint  Patient presents with  . Follow-up    seen for Dr. Wynonia Lawman, complain of recurrent AFIB    History of Present Illness:  [name] is a 70 y.o. female with PMH of paroxysmal atrial fibrillation on Xarelto, GERD, anxiety/disorder and intermittent claudication (no imagine results). She was recently admitted on 05/06/2016 at [facility] for recurrent atrial fibrillation. Her carvedilol was stopped and she was started on diltiazem and the Xarelto. Echocardiogram showed moderate tricuspid and mitral regurgitation. She was seen in the cardiology office on 11/6 and most recently 11/22. Also of her symptom seems to be more driven by anxiety. She is constantly wandering when she has moved back into atrial fibrillation. Based on the phone note, she has called after hour answering service several times in the last 3 days complaining of recurrent atrial fibrillation issue, however each time her heart rate was in the 60 to 70s.  She presents today complaining of persistent atrial fibrillation for the past 4 days. She says every time she lay down, she can feel her chest pounding. She denies any obvious chest pain. She denies any shortness breath, dizziness, lower extremity edema, orthopnea or PND. She says the symptoms she had in the last 4 days is different from her previous atrial fibrillation when she feels her heart rate going out of control and running rampant throughout her entire chest. Her EKG today shows normal sinus rhythm. I have reassured the patient. She has been compliant with her Xarelto and has not noticed any significant bleeding. She has brought her blood pressure diary including her heart rate for the past few days, her heart rate has been maintaining in the 70s throughout. Her blood pressure is mildly elevated in the morning,  however after she take her medication, it comes down to the normal range. I do not think she needs an event monitor, she says she has tried an event monitor before however due to allergy toward the adhesive, she developed severe rash and had to be treated by a dermatologist. Her heart rate yesterday suggested more toward sinus rhythm. Her recent multiple visit has been driven largely by anxiety. I have discussed with her pathophysiology of atrial fibrillation in hope to give her a better understanding of atrial fibrillation and make her understand when to seek medical attention. She also has significant amount of cervical pain and stiffness which is followed by her orthopedic surgeon.    Past Medical History:  Diagnosis Date  . Allergy   . Anxiety   . Benign paroxysmal positional vertigo 06/08/2013  . Chronic a-fib ([place]) 08/07/2014  . Fracture of multiple ribs   . GERD (gastroesophageal reflux disease)   . Hair loss 04/12/2012  . Herpes   . IC (intermittent claudication) ([place])   . Interstitial cystitis 11/06/2011  . Kidney stone   . 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 Osteopenia 02/18/2012  . Osteoporosis   . PAF (paroxysmal atrial fibrillation) ([place]) 2012  . Peripheral neuropathy ([place]) 11/06/2011  . PMDD (premenstrual dysphoric disorder)   . Rotator cuff tear   . Vaginal delivery    ONE NSVD  . 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 BACK SURGERY     DISC FUSION L5,L6,L7  . BLADDER SURGERY  Bladder sling  . BREAST SURGERY     BREAST BIOPSY--RIGHT BENIGN  . BUNIONECTOMY    . HEAD & NECK SKIN LESION EXCISIONAL BIOPSY     melanoma  . NECK FUSION    . ROTATOR CUFF REPAIR  2012   RIGHT  . SPINAL FUSION  dec 2015   . VAGINAL HYSTERECTOMY     TVH    Current Medications: Outpatient Medications Prior to Visit  Medication Sig Dispense Refill  . acetaminophen (TYLENOL) 500 MG tablet Take 1,000 mg by mouth every 6 (six) hours as needed for mild pain.       . Calcium-Vitamin D-Vitamin K (VIACTIV PO) Take by mouth.    . clonazePAM (KLONOPIN) 0.5 MG tablet Take 0.5 tablets (0.25 mg total) by mouth 2 (two) times daily as needed for anxiety. 60 tablet 1  . diltiazem (CARDIZEM CD) 120 MG 24 hr capsule Take 1 capsule by mouth daily.    . meclizine (ANTIVERT) 12.5 MG tablet TAKE ONE TABLET BY MOUTH THREE TIMES DAILY AS NEEDED FOR DIZZINESS 15 tablet 0  . metoprolol tartrate (LOPRESSOR) 25 MG tablet Take 1 tablet (25 mg total) by mouth 2 (two) times daily. 60 tablet 11  . ondansetron (ZOFRAN) 4 MG tablet TAKE ONE TABLET BY MOUTH EVERY 8 HOURS AS NEEDED FOR NAUSEA OR VOMITING 20 tablet 3  . oxyCODONE (ROXICODONE) 5 MG/5ML solution 5-10 mls every 4-6 hours    . polyethylene glycol powder (GLYCOLAX/MIRALAX) powder     . Probiotic Product (RESTORA PO) Take by mouth.    . rivaroxaban (XARELTO) 20 MG TABS tablet Take 1 tablet by mouth daily.    . valACYclovir (VALTREX) 500 MG tablet as needed.     . Cholecalciferol (VITAMIN D3) 2000 units capsule Take 1 capsule by mouth daily.     [name] escitalopram (LEXAPRO) 5 MG tablet Take 1 tablet (5 mg total) by mouth daily. 30 tablet 3  . fluticasone (FLONASE) 50 MCG/ACT nasal spray Place 2 sprays into both nostrils daily. 16 g 6  . furosemide (LASIX) 20 MG tablet Take 1 tablet (20 mg total) by mouth daily. 30 tablet 1  . loratadine (CLARITIN) 10 MG tablet Take 10 mg by mouth daily as needed.      No facility-administered medications prior to visit.      Allergies:   Contrast media [iodinated diagnostic agents]; Erythromycin; Latex; Levofloxacin; Other; Oxycodone; Septra [bactrim]; Sulfa antibiotics; and Morphine   Social History   Social History  . Marital status: Divorced    Spouse name: N/A  . Number of children: N/A  . Years of education: N/A   Social History Main Topics  . Smoking status: Former Smoker    Packs/day: 0.25    Years: 14.00    Types: Cigarettes    Quit date: 07/07/1984  . Smokeless tobacco:  Never Used  . Alcohol use No  . Drug use: No  . Sexual activity: Not Asked   Other Topics Concern  . None   Social History Narrative  . None     Family History:  The patient's family history includes Arthritis in her mother; Cancer in her mother; Diabetes in her brother, brother, and father; Heart Problems in her brother; Heart disease in her mother, sister, and sister; Hyperlipidemia in her sister and sister; Stroke in her sister.   ROS:   Please see the history of present illness.    ROS All other systems reviewed and are negative.   PHYSICAL EXAM:   VS:  BP 110/62   Pulse 73   Ht 5\' 6"  (1.676 m)   Wt 120 lb (54.4 kg)   BMI 19.37 kg/m    GEN: Well nourished, well developed, in no acute distress  HEENT: normal  Neck: no JVD, carotid bruits, or masses Cardiac: RRR; no murmurs, rubs, or gallops,no edema  Respiratory:  clear to auscultation bilaterally, normal work of breathing GI: soft, nontender, nondistended, + BS MS: no deformity or atrophy  Skin: warm and dry, no rash Neuro:  Alert and Oriented x 3, Strength and sensation are intact Psych: euthymic mood, full affect  Wt Readings from Last 3 Encounters:  06/02/16 120 lb (54.4 kg)  05/28/16 120 lb 12.8 oz (54.8 kg)  05/23/16 121 lb 6 oz (55.1 kg)      Studies/Labs Reviewed:   EKG:  EKG is ordered today.  The ekg ordered today demonstrates Normal sinus rhythm with poor progression in the anterior lead, otherwise no significant ST-T wave changes.  Recent Labs: 04/21/2016: ALT 13; BUN 15; Creatinine, Ser 0.60; Hemoglobin 13.4; Platelets 175.0; Potassium 4.3; Sodium 143; TSH 2.16   Lipid Panel    Component Value Date/Time   CHOL 214 (H) 04/21/2016 0947   TRIG 81.0 04/21/2016 0947   HDL 86.60 04/21/2016 0947   CHOLHDL 2 04/21/2016 0947   VLDL 16.2 04/21/2016 0947   LDLCALC 111 (H) 04/21/2016 0947   LDLDIRECT 136.5 06/08/2013 1049    Additional studies/ records that were reviewed today include:   Echo  08/08/2014 LV EF: 50% -  55%  - Left ventricle: The cavity size was normal. Systolic function was normal. The estimated ejection fraction was in the range of 50% to 55%. Wall motion was normal; there were no regional wall motion abnormalities. - Pericardium, extracardiac: A trivial pericardial effusion was identified.    ASSESSMENT:    1. Paroxysmal atrial fibrillation (HCC)   2. Neck pain, chronic   3. Anxiety      PLAN:  In order of problems listed above:  1. PAF:  - This patients CHA2DS2-VASc Score and unadjusted Ischemic Stroke Rate (% per year) is equal to 2.2 % stroke rate/year from a score of 2  Above score calculated as 1 point each if present [CHF, HTN, DM, Vascular=MI/PAD/Aortic Plaque, Age if 65-74, or Female] Above score calculated as 2 points each if present [Age > 75, or Stroke/TIA/TE]   - Despite having intermittent  pounding sensation in her chest for 4 days, there is no obvious indication that she has had multiple recurrent atrial fibrillation. Her symptom is different from her previous episode of atrial fibrillation. Her heart rate has maintaining in the 70s based on blood pressure measurement and heart rate measurement recorded in her diary. At this point, I do not think we need to order any cardiac event monitor. I have reassured the patient, she has a follow-up with Suanne Marker next week, I will delay this for 2 more weeks since she was added on and was seen by me today. After which, she will follow-up with Dr. Wynonia Lawman once he returned for his medical leave. I have instructed her to take an additional half a tablet of metoprolol if she does have recurrent tachycardia with heart rate of greater than 100 at rest.  2. Anxiety: Likely driving her recurrent issue, I had a long discussion with her regarding pathophysiology of atrial fibrillation, regarding risk of stroke and uncontrolled heart rate. I told her that her risk of stroke is relatively low given the fact  that  she is on Xarelto, and told her to focus on heart rate. I think having her focus on a single thing instead of multiple things would be more beneficial to her treatment. I have discussed with her that sometimes we leave people in permanent atrial fibrillation as long as they have heart rate under control and is on appropriate systemic anticoagulation.    Medication Adjustments/Labs and Tests Ordered: Current medicines are reviewed at length with the patient today.  Concerns regarding medicines are outlined above.  Medication changes, Labs and Tests ordered today are listed in the Patient Instructions below. Patient Instructions  Medication Instructions:  Your physician recommends that you continue on your current medications as directed. Please refer to the Current Medication list given to you today.  Labwork: None   Testing/Procedures: None   Follow-Up: Your physician recommends that you schedule a follow-up appointment in: [place]  Any Other Special Instructions Will Be Listed Below (If Applicable).     If you need a refill on your cardiac medications before your next appointment, please call your pharmacy.     Hilbert Corrigan, [place]  06/02/2016 12:48 PM    [facility] [address], [place], [place]  [address] Phone: [phone]; Fax: [phone]

## 2016-06-03 DIAGNOSIS — R002 Palpitations: Secondary | ICD-10-CM | POA: Diagnosis not present

## 2016-06-03 DIAGNOSIS — F410 Panic disorder [episodic paroxysmal anxiety] without agoraphobia: Secondary | ICD-10-CM | POA: Diagnosis not present

## 2016-06-03 DIAGNOSIS — I480 Paroxysmal atrial fibrillation: Secondary | ICD-10-CM | POA: Diagnosis not present

## 2016-06-03 DIAGNOSIS — I348 Other nonrheumatic mitral valve disorders: Secondary | ICD-10-CM | POA: Diagnosis not present

## 2016-06-04 DIAGNOSIS — M542 Cervicalgia: Secondary | ICD-10-CM | POA: Diagnosis not present

## 2016-06-05 ENCOUNTER — Encounter (HOSPITAL_COMMUNITY): Payer: Self-pay | Admitting: Psychiatry

## 2016-06-05 ENCOUNTER — Ambulatory Visit (INDEPENDENT_AMBULATORY_CARE_PROVIDER_SITE_OTHER): Payer: Medicare Other | Admitting: Psychiatry

## 2016-06-05 VITALS — BP 120/66 | HR 75 | Resp 16 | Ht 66.0 in | Wt 122.0 lb

## 2016-06-05 DIAGNOSIS — F4323 Adjustment disorder with mixed anxiety and depressed mood: Secondary | ICD-10-CM

## 2016-06-05 DIAGNOSIS — Z79899 Other long term (current) drug therapy: Secondary | ICD-10-CM

## 2016-06-05 DIAGNOSIS — Z9889 Other specified postprocedural states: Secondary | ICD-10-CM

## 2016-06-05 DIAGNOSIS — F0630 Mood disorder due to known physiological condition, unspecified: Secondary | ICD-10-CM | POA: Diagnosis not present

## 2016-06-05 DIAGNOSIS — Z833 Family history of diabetes mellitus: Secondary | ICD-10-CM

## 2016-06-05 DIAGNOSIS — Z823 Family history of stroke: Secondary | ICD-10-CM

## 2016-06-05 DIAGNOSIS — Z8349 Family history of other endocrine, nutritional and metabolic diseases: Secondary | ICD-10-CM

## 2016-06-05 DIAGNOSIS — F411 Generalized anxiety disorder: Secondary | ICD-10-CM

## 2016-06-05 DIAGNOSIS — Z888 Allergy status to other drugs, medicaments and biological substances status: Secondary | ICD-10-CM

## 2016-06-05 DIAGNOSIS — F410 Panic disorder [episodic paroxysmal anxiety] without agoraphobia: Secondary | ICD-10-CM | POA: Diagnosis not present

## 2016-06-05 DIAGNOSIS — Z8249 Family history of ischemic heart disease and other diseases of the circulatory system: Secondary | ICD-10-CM

## 2016-06-05 DIAGNOSIS — F329 Major depressive disorder, single episode, unspecified: Secondary | ICD-10-CM | POA: Diagnosis not present

## 2016-06-05 DIAGNOSIS — Z882 Allergy status to sulfonamides status: Secondary | ICD-10-CM

## 2016-06-05 DIAGNOSIS — Z79891 Long term (current) use of opiate analgesic: Secondary | ICD-10-CM

## 2016-06-05 DIAGNOSIS — G8929 Other chronic pain: Secondary | ICD-10-CM | POA: Diagnosis not present

## 2016-06-05 DIAGNOSIS — Z87891 Personal history of nicotine dependence: Secondary | ICD-10-CM

## 2016-06-05 DIAGNOSIS — Z91040 Latex allergy status: Secondary | ICD-10-CM

## 2016-06-05 DIAGNOSIS — Z8261 Family history of arthritis: Secondary | ICD-10-CM

## 2016-06-05 MED ORDER — CLONAZEPAM 0.5 MG PO TABS: 0.2500 mg | ORAL_TABLET | Freq: Two times a day (BID) | ORAL | 1 refills | Status: DC | PRN

## 2016-06-05 NOTE — Progress Notes (Signed)
Patient ID: [name], female   DOB: 09/24/1945, 70 y.o.   MRN: [id number]  [facility]  Outpatient Follow up visit  Patient Identification: [name] MRN:  [id number] Date of Evaluation:  06/05/2016 Referral Source: Primary care Chief Complaint:   Chief Complaint    Follow-up     Visit Diagnosis:    ICD-9-CM ICD-10-CM   1. 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 GAD (generalized anxiety disorder) 300.02 F41.1   3. Other chronic pain 338.29 G89.29   4. Adjustment disorder with mixed anxiety and depressed mood 309.28 F43.23     History of Present Illness:  70 years old currently single Caucasian female initially  referred for management of possible depression, anxiety she suffers from pain because of spinal fusion surgeries and history of scoliosis.  She recently has been diagnosed with atrial fibrillation was hospitalized because of her heart condition and is now on 2 or 3 different medications. She worries about having another irregular heartbeat or palpitations overall her anxiety level has increased her anxiety has worsened she's not been taking Klonopin since it was stopped by primary care physician considering she is also on pain medications. \She was having a better quality life when she was on Klonopin she must restart that again In regarding to her pain she is on oxycodone She is still trying to adjust to her physical health and her heart condition but is having anxiety related to death Her worries remain excessive and at times more reasonable rather than unreasonable Aggravating factors; pain, scoliosis, surgeries Modifying factors; her son and her pet  associated symptoms: insomnia and pain   Substance Abuse History in the last 12 months:  No.  Consequences of Substance Abuse: NA  Past Medical History:  Past Medical History:  Diagnosis Date  . Allergy   . Anxiety   . Benign paroxysmal positional vertigo 06/08/2013  . Chronic a-fib ([place])  08/07/2014  . Fracture of multiple ribs   . GERD (gastroesophageal reflux disease)   . Hair loss 04/12/2012  . Herpes   . IC (intermittent claudication) ([place])   . Interstitial cystitis 11/06/2011  . Kidney stone   . 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 Osteopenia 02/18/2012  . Osteoporosis   . PAF (paroxysmal atrial fibrillation) ([place]) 2012  . Peripheral neuropathy ([place]) 11/06/2011  . PMDD (premenstrual dysphoric disorder)   . Rotator cuff tear   . Vaginal delivery    ONE NSVD  . 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 BACK SURGERY     DISC FUSION L5,L6,L7  . BLADDER SURGERY     Bladder sling  . BREAST SURGERY     BREAST BIOPSY--RIGHT BENIGN  . BUNIONECTOMY    . HEAD & NECK SKIN LESION EXCISIONAL BIOPSY     melanoma  . NECK FUSION    . ROTATOR CUFF REPAIR  2012   RIGHT  . SPINAL FUSION  dec 2015   . VAGINAL HYSTERECTOMY     TVH    Family Psychiatric History: brother, sister has depression  Family History:  Family History  Problem Relation Age of Onset  . Diabetes Father   . Hyperlipidemia Sister   . Heart disease Sister   . Stroke Sister   . Diabetes Brother   . Hyperlipidemia Sister   . Heart disease Sister   . Arthritis Mother   . Cancer Mother     uterus  . Heart disease Mother   .  Diabetes Brother   . Heart Problems Brother     Social History:   Social History   Social History  . Marital status: Divorced    Spouse name: N/A  . Number of children: N/A  . Years of education: N/A   Social History Main Topics  . Smoking status: Former Smoker    Packs/day: 0.25    Years: 14.00    Types: Cigarettes    Quit date: 07/07/1984  . Smokeless tobacco: Never Used  . Alcohol use No  . Drug use: No  . Sexual activity: Not Asked   Other Topics Concern  . None   Social History Narrative  . None   Allergies:   Allergies  Allergen Reactions  . Contrast Media [Iodinated Diagnostic Agents] Hives    unknown  . Erythromycin      unknown  . Latex     hives  . Levofloxacin Nausea And Vomiting  . Other     IV CONTRAST /"DYE".  . Oxycodone     Other reaction(s): Delusions (intolerance)  . Septra [Bactrim]     Hives   . Sulfa Antibiotics     rash  . Morphine Anxiety    Metabolic Disorder Labs: No results found for: HGBA1C, MPG No results found for: PROLACTIN Lab Results  Component Value Date   CHOL 214 (H) 04/21/2016   TRIG 81.0 04/21/2016   HDL 86.60 04/21/2016   CHOLHDL 2 04/21/2016   VLDL 16.2 04/21/2016   LDLCALC 111 (H) 04/21/2016   LDLCALC 102 (H) 10/01/2015     Current Medications: Current Outpatient Prescriptions  Medication Sig Dispense Refill  . acetaminophen (TYLENOL) 500 MG tablet Take 1,000 mg by mouth every 6 (six) hours as needed for mild pain.     . Calcium-Vitamin D-Vitamin K (VIACTIV PO) Take by mouth.    . clonazePAM (KLONOPIN) 0.5 MG tablet Take 0.5 tablets (0.25 mg total) by mouth 2 (two) times daily as needed for anxiety. 30 tablet 1  . diltiazem (CARDIZEM CD) 120 MG 24 hr capsule Take 1 capsule by mouth daily.    . meclizine (ANTIVERT) 12.5 MG tablet TAKE ONE TABLET BY MOUTH THREE TIMES DAILY AS NEEDED FOR DIZZINESS 15 tablet 0  . metoprolol tartrate (LOPRESSOR) 25 MG tablet Take 1 tablet (25 mg total) by mouth 2 (two) times daily. 60 tablet 11  . ondansetron (ZOFRAN) 4 MG tablet TAKE ONE TABLET BY MOUTH EVERY 8 HOURS AS NEEDED FOR NAUSEA OR VOMITING 20 tablet 3  . oxyCODONE (ROXICODONE) 5 MG/5ML solution 5-10 mls every 4-6 hours    . polyethylene glycol powder (GLYCOLAX/MIRALAX) powder     . Probiotic Product (RESTORA PO) Take by mouth.    . rivaroxaban (XARELTO) 20 MG TABS tablet Take 1 tablet by mouth daily.    . valACYclovir (VALTREX) 500 MG tablet as needed.      No current facility-administered medications for this visit.     Headaches: fluctuates Pain at the back of neck  Psychiatric Specialty Exam: Review of Systems  Constitutional: Negative for fever.   Cardiovascular: Negative for palpitations.  Musculoskeletal: Positive for back pain and neck pain.  Skin: Negative for rash.  Psychiatric/Behavioral: Negative for substance abuse. The patient is nervous/anxious.     Blood pressure 120/66, pulse 75, resp. rate 16, height 5\' 6"  (1.676 m), weight 122 lb (55.3 kg), SpO2 95 %.Body mass index is 19.69 kg/m.  General Appearance: Casual  Eye Contact:  Fair  Speech:  Normal Rate  Volume:  Normal  Mood:  anxious  Affect:  constricted  Thought Process:  Goal Directed  Orientation:  Full (Time, Place, and Person)  Thought Content:  Rumination  Suicidal Thoughts:  No  Homicidal Thoughts:  No  Memory:  Immediate;   Fair Recent;   Fair  Judgement:  Fair  Insight:  Shallow  Psychomotor Activity:  Decreased  Concentration:  Concentration: Fair and Attention Span: Fair  Recall:  [employer] of Knowledge:Fair  Language: Fair  Akathisia:  Negative  Handed:  Right  AIMS (if indicated):    Assets:  Desire for Improvement  ADL's:  Intact  Cognition: WNL  Sleep:  Variable     Treatment Plan Summary: Medication management and Plan as follows   Mood disorder secondary to [place]: adjusting to pain but endorsing more anxiety. Does not want to be on ssri Anxiety and GAD: re start klonopine small dose of 0.5mg  half bid for a better quality of life and her worries effect her heart condition Pain: managed by primary care.  GAD: not on lexapro . Anxiety is worsened. Will re instate klonopine as above More than 50% of 25 minutes spent in counseling and coordination of care including patient education and review of side effects. Supportive therapy regarding her pain and review stressors and discussed distraction techniques FU in 4 weeks or earlier if needed  Merian Capron, MD 11/30/20171:28 PM

## 2016-06-09 ENCOUNTER — Ambulatory Visit: Payer: Medicare Other | Admitting: Physician Assistant

## 2016-06-10 DIAGNOSIS — M542 Cervicalgia: Secondary | ICD-10-CM | POA: Diagnosis not present

## 2016-06-11 ENCOUNTER — Ambulatory Visit (INDEPENDENT_AMBULATORY_CARE_PROVIDER_SITE_OTHER): Payer: Medicare Other | Admitting: Licensed Clinical Social Worker

## 2016-06-11 DIAGNOSIS — F4323 Adjustment disorder with mixed anxiety and depressed mood: Secondary | ICD-10-CM

## 2016-06-11 DIAGNOSIS — G8929 Other chronic pain: Secondary | ICD-10-CM

## 2016-06-12 DIAGNOSIS — Z7901 Long term (current) use of anticoagulants: Secondary | ICD-10-CM | POA: Diagnosis not present

## 2016-06-12 DIAGNOSIS — R002 Palpitations: Secondary | ICD-10-CM | POA: Diagnosis not present

## 2016-06-12 DIAGNOSIS — F410 Panic disorder [episodic paroxysmal anxiety] without agoraphobia: Secondary | ICD-10-CM | POA: Diagnosis not present

## 2016-06-12 DIAGNOSIS — I480 Paroxysmal atrial fibrillation: Secondary | ICD-10-CM | POA: Diagnosis not present

## 2016-06-12 DIAGNOSIS — I348 Other nonrheumatic mitral valve disorders: Secondary | ICD-10-CM | POA: Diagnosis not present

## 2016-06-12 NOTE — Progress Notes (Signed)
   THERAPIST PROGRESS NOTE  Session Time: 11:05-11:55am  Participation Level: Active  Behavioral Response: Well GroomedAlertAnxious and Tearful  Type of Therapy: Individual Therapy  Treatment Goals addressed: Reduce worry and anxiety   Interventions: Treatment planning    Suicidal/Homicidal: Denied both  Therapist Interventions: Collaborated with patient to develop her treatment plan.  Briefly described interventions patient can expect as she participates in therapy. Provided patient with a handout describing how to practice a breathing exercise for relaxation called the 4-7-8 Breath.  Encouraged her to check out the web address on the page which leads to a video of someone demonstrating the technique.  Summary:  Reported that after initialing meeting with this therapy in early August she decided not to schedule therapy appointments because at the time she was having too many doctors appointments during the week.  Indicated she feels as though it is important to get help for her anxiety now because it seems to have worsened.  Noted that in October she was hospitalized for heart problems.  Now on a blood thinner and two heart medications.  She says "They aren't working."  Much of her worries continue to be related to her health.  Developed the following treatment goal: Raigyn will report spending significantly less time worrying and a significant reduction in intensity of anxiety she feels in her body.    Believes that the anxiety that she experiences is intolerable and that there must be something out there that she can take to ease her nerves.  Seems to wants a quick fix to a problem that is not so simple.  Plan: Scheduled to return 12/27.  Will formally teach the breathing exercise and educate patient about anxiety.  Diagnosis: Adjustment disorder with mixed anxiety and depressed mood                         Chronic pain    Armandina Stammer 06/11/2016

## 2016-06-15 ENCOUNTER — Telehealth: Payer: Self-pay | Admitting: Physician Assistant

## 2016-06-15 NOTE — Telephone Encounter (Signed)
Pt w/ hx palpitations and has heart monitor on.  Pt began experiencing dizziness.  BP 126/72, 130/78  HR 66 & 68  Reassured her that her BP and heart rates are normal.  Therefore, do not think the dizziness is coming from her heart.   Pt has Antivert and wants to know if she can take it>>said she could.   She will call Dr Thurman Coyer office tomorrow.  Rosaria Ferries, Hershal Coria 06/15/2016 10:56 AM Beeper [phone]

## 2016-06-23 ENCOUNTER — Ambulatory Visit: Payer: Medicare Other | Admitting: Physician Assistant

## 2016-06-24 ENCOUNTER — Ambulatory Visit (INDEPENDENT_AMBULATORY_CARE_PROVIDER_SITE_OTHER): Payer: Medicare Other | Admitting: Psychiatry

## 2016-06-24 ENCOUNTER — Encounter (HOSPITAL_COMMUNITY): Payer: Self-pay | Admitting: Psychiatry

## 2016-06-24 ENCOUNTER — Ambulatory Visit (HOSPITAL_COMMUNITY): Payer: Self-pay | Admitting: Licensed Clinical Social Worker

## 2016-06-24 VITALS — BP 136/76 | HR 70 | Ht 66.0 in | Wt 124.0 lb

## 2016-06-24 DIAGNOSIS — Z79899 Other long term (current) drug therapy: Secondary | ICD-10-CM

## 2016-06-24 DIAGNOSIS — Z79891 Long term (current) use of opiate analgesic: Secondary | ICD-10-CM

## 2016-06-24 DIAGNOSIS — Z8249 Family history of ischemic heart disease and other diseases of the circulatory system: Secondary | ICD-10-CM

## 2016-06-24 DIAGNOSIS — Z91040 Latex allergy status: Secondary | ICD-10-CM

## 2016-06-24 DIAGNOSIS — Z87891 Personal history of nicotine dependence: Secondary | ICD-10-CM

## 2016-06-24 DIAGNOSIS — Z833 Family history of diabetes mellitus: Secondary | ICD-10-CM

## 2016-06-24 DIAGNOSIS — Z888 Allergy status to other drugs, medicaments and biological substances status: Secondary | ICD-10-CM

## 2016-06-24 DIAGNOSIS — F4323 Adjustment disorder with mixed anxiety and depressed mood: Secondary | ICD-10-CM | POA: Diagnosis not present

## 2016-06-24 DIAGNOSIS — Z882 Allergy status to sulfonamides status: Secondary | ICD-10-CM

## 2016-06-24 DIAGNOSIS — F411 Generalized anxiety disorder: Secondary | ICD-10-CM | POA: Diagnosis not present

## 2016-06-24 DIAGNOSIS — G8929 Other chronic pain: Secondary | ICD-10-CM

## 2016-06-24 DIAGNOSIS — Z9889 Other specified postprocedural states: Secondary | ICD-10-CM | POA: Diagnosis not present

## 2016-06-24 DIAGNOSIS — Z8261 Family history of arthritis: Secondary | ICD-10-CM

## 2016-06-24 DIAGNOSIS — F0630 Mood disorder due to known physiological condition, unspecified: Secondary | ICD-10-CM

## 2016-06-24 DIAGNOSIS — Z8489 Family history of other specified conditions: Secondary | ICD-10-CM

## 2016-06-24 DIAGNOSIS — F410 Panic disorder [episodic paroxysmal anxiety] without agoraphobia: Secondary | ICD-10-CM

## 2016-06-24 DIAGNOSIS — Z823 Family history of stroke: Secondary | ICD-10-CM

## 2016-06-24 MED ORDER — SERTRALINE HCL 25 MG PO TABS: 25.0000 mg | ORAL_TABLET | Freq: Every day | ORAL | 0 refills | Status: DC

## 2016-06-24 NOTE — Progress Notes (Signed)
Patient ID: [name], female   DOB: 1945-12-16, 70 y.o.   MRN: [id number]  [facility]  Outpatient Follow up visit  Patient Identification: [name] MRN:  [id number] Date of Evaluation:  06/24/2016 Referral Source: Primary care Chief Complaint:   Chief Complaint    Follow-up     Visit Diagnosis:    ICD-9-CM ICD-10-CM   1. 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 GAD (generalized anxiety disorder) 300.02 F41.1   3. Other chronic pain 338.29 G89.29   4. Adjustment disorder with mixed anxiety and depressed mood 309.28 F43.23   5. Panic attacks 300.01 F41.0     History of Present Illness:  70 years old currently single Caucasian female initially  referred for management of possible depression, anxiety she suffers from pain because of spinal fusion surgeries and history of scoliosis.  Patient has had worries related to her physical health she has atrial fibrillation the past currently she is on heart monitoring. Last visit we restarted back on Klonopin because without that her worries were getting extensive and considering she has heart condition She has good support of her son she is on pain medication she is to follow with her pain doctor regarding any other thing needed that would help  No psychotic symptoms. Depression still they're relevant to her pain and her physical condition. She has been on different medications in the past but none of them worked. We did Gene testing and shows medication that she has been on before including Lexapro were okay related to her genetic  interaction  Aggravating factors; pain, scoliosis, surgeries Modifying factors; her son and her pet  associated symptoms: insomnia and pain   Substance Abuse History in the last 12 months:  No.  Consequences of Substance Abuse: NA  Past Medical History:  Past Medical History:  Diagnosis Date  . Allergy   . Anxiety   . Benign paroxysmal positional vertigo 06/08/2013  . Chronic a-fib  ([place]) 08/07/2014  . Fracture of multiple ribs   . GERD (gastroesophageal reflux disease)   . Hair loss 04/12/2012  . Herpes   . IC (intermittent claudication) ([place])   . Interstitial cystitis 11/06/2011  . Kidney stone   . 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 Osteopenia 02/18/2012  . Osteoporosis   . PAF (paroxysmal atrial fibrillation) ([place]) 2012  . Peripheral neuropathy ([place]) 11/06/2011  . PMDD (premenstrual dysphoric disorder)   . Rotator cuff tear   . Vaginal delivery    ONE NSVD  . 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 BACK SURGERY     DISC FUSION L5,L6,L7  . BLADDER SURGERY     Bladder sling  . BREAST SURGERY     BREAST BIOPSY--RIGHT BENIGN  . BUNIONECTOMY    . HEAD & NECK SKIN LESION EXCISIONAL BIOPSY     melanoma  . NECK FUSION    . ROTATOR CUFF REPAIR  2012   RIGHT  . SPINAL FUSION  dec 2015   . VAGINAL HYSTERECTOMY     TVH    Family Psychiatric History: brother, sister has depression  Family History:  Family History  Problem Relation Age of Onset  . Diabetes Father   . Hyperlipidemia Sister   . Heart disease Sister   . Stroke Sister   . Diabetes Brother   . Hyperlipidemia Sister   . Heart disease Sister   . Arthritis Mother   . Cancer Mother  uterus  . Heart disease Mother   . Diabetes Brother   . Heart Problems Brother     Social History:   Social History   Social History  . Marital status: Divorced    Spouse name: N/A  . Number of children: N/A  . Years of education: N/A   Social History Main Topics  . Smoking status: Former Smoker    Packs/day: 0.25    Years: 14.00    Types: Cigarettes    Quit date: 07/07/1984  . Smokeless tobacco: Never Used  . Alcohol use No  . Drug use: No  . Sexual activity: Not Asked   Other Topics Concern  . None   Social History Narrative  . None   Allergies:   Allergies  Allergen Reactions  . Contrast Media [Iodinated Diagnostic Agents] Hives    unknown  . Erythromycin      unknown  . Latex     hives  . Levofloxacin Nausea And Vomiting  . Other     IV CONTRAST /"DYE".  . Oxycodone     Other reaction(s): Delusions (intolerance)  . Septra [Bactrim]     Hives   . Sulfa Antibiotics     rash  . Morphine Anxiety    Metabolic Disorder Labs: No results found for: HGBA1C, MPG No results found for: PROLACTIN Lab Results  Component Value Date   CHOL 214 (H) 04/21/2016   TRIG 81.0 04/21/2016   HDL 86.60 04/21/2016   CHOLHDL 2 04/21/2016   VLDL 16.2 04/21/2016   LDLCALC 111 (H) 04/21/2016   LDLCALC 102 (H) 10/01/2015     Current Medications: Current Outpatient Prescriptions  Medication Sig Dispense Refill  . acetaminophen (TYLENOL) 500 MG tablet Take 1,000 mg by mouth every 6 (six) hours as needed for mild pain.     . Calcium-Vitamin D-Vitamin K (VIACTIV PO) Take by mouth.    . clonazePAM (KLONOPIN) 0.5 MG tablet Take 0.5 tablets (0.25 mg total) by mouth 2 (two) times daily as needed for anxiety. 30 tablet 1  . diltiazem (CARDIZEM CD) 120 MG 24 hr capsule Take 1 capsule by mouth daily.    . meclizine (ANTIVERT) 12.5 MG tablet TAKE ONE TABLET BY MOUTH THREE TIMES DAILY AS NEEDED FOR DIZZINESS 15 tablet 0  . metoprolol tartrate (LOPRESSOR) 25 MG tablet Take 1 tablet (25 mg total) by mouth 2 (two) times daily. 60 tablet 11  . ondansetron (ZOFRAN) 4 MG tablet TAKE ONE TABLET BY MOUTH EVERY 8 HOURS AS NEEDED FOR NAUSEA OR VOMITING 20 tablet 3  . oxyCODONE (ROXICODONE) 5 MG/5ML solution 5-10 mls every 4-6 hours    . polyethylene glycol powder (GLYCOLAX/MIRALAX) powder     . Probiotic Product (RESTORA PO) Take by mouth.    . rivaroxaban (XARELTO) 20 MG TABS tablet Take 1 tablet by mouth daily.    . valACYclovir (VALTREX) 500 MG tablet as needed.     . sertraline (ZOLOFT) 25 MG tablet Take 1 tablet (25 mg total) by mouth daily. 30 tablet 0   No current facility-administered medications for this visit.     Headaches: fluctuates Pain at the back of  neck  Psychiatric Specialty Exam: Review of Systems  Constitutional: Negative for fever.  Cardiovascular: Negative for chest pain.  Musculoskeletal: Positive for back pain and neck pain.  Skin: Negative for rash.  Psychiatric/Behavioral: Positive for depression. Negative for substance abuse. The patient is nervous/anxious.     Blood pressure 136/76, pulse 70, height 5\' 6"  (1.676 m),  weight 124 lb (56.2 kg), SpO2 93 %.Body mass index is 20.01 kg/m.  General Appearance: Casual  Eye Contact:  Fair  Speech:  Normal Rate  Volume:  Normal  Mood:  Somewhat anxious  Affect:  constricted  Thought Process:  Goal Directed  Orientation:  Full (Time, Place, and Person)  Thought Content:  Rumination  Suicidal Thoughts:  No  Homicidal Thoughts:  No  Memory:  Immediate;   Fair Recent;   Fair  Judgement:  Fair  Insight:  Shallow  Psychomotor Activity:  Decreased  Concentration:  Concentration: Fair and Attention Span: Fair  Recall:  [employer] of Knowledge:Fair  Language: Fair  Akathisia:  Negative  Handed:  Right  AIMS (if indicated):    Assets:  Desire for Improvement  ADL's:  Intact  Cognition: WNL  Sleep:  Variable     Treatment Plan Summary: Medication management and Plan as follows   Mood disorder sec to [place]: cotninue klonopine to ease with the anxiety. Add zoloft small dose 25mg  qd Anxiety disorder; continue klonopine and zoloft Pain: managed by primary care.  More than 50% 25 minutes spent in counseling and coordination of care including medication education abuse side effects and review of psychosocial stressors including her physical health and her worries to develop coping skills and possible continued therapy Review side effects related to Zoloft. Follow up in 1 month or earlier if needed  Merian Capron, MD 12/19/20179:45 AM

## 2016-06-26 ENCOUNTER — Ambulatory Visit (HOSPITAL_COMMUNITY): Payer: Self-pay | Admitting: Psychiatry

## 2016-06-26 DIAGNOSIS — I4891 Unspecified atrial fibrillation: Secondary | ICD-10-CM | POA: Diagnosis not present

## 2016-06-26 DIAGNOSIS — Z79899 Other long term (current) drug therapy: Secondary | ICD-10-CM | POA: Diagnosis not present

## 2016-06-26 DIAGNOSIS — Z91048 Other nonmedicinal substance allergy status: Secondary | ICD-10-CM | POA: Diagnosis not present

## 2016-06-26 DIAGNOSIS — Z91041 Radiographic dye allergy status: Secondary | ICD-10-CM | POA: Diagnosis not present

## 2016-06-26 DIAGNOSIS — Z4789 Encounter for other orthopedic aftercare: Secondary | ICD-10-CM | POA: Diagnosis not present

## 2016-06-26 DIAGNOSIS — M5412 Radiculopathy, cervical region: Secondary | ICD-10-CM | POA: Diagnosis not present

## 2016-06-26 DIAGNOSIS — Z981 Arthrodesis status: Secondary | ICD-10-CM | POA: Diagnosis not present

## 2016-06-26 DIAGNOSIS — Z87891 Personal history of nicotine dependence: Secondary | ICD-10-CM | POA: Diagnosis not present

## 2016-06-26 DIAGNOSIS — Z885 Allergy status to narcotic agent status: Secondary | ICD-10-CM | POA: Diagnosis not present

## 2016-06-26 DIAGNOSIS — Z91040 Latex allergy status: Secondary | ICD-10-CM | POA: Diagnosis not present

## 2016-06-26 DIAGNOSIS — Z882 Allergy status to sulfonamides status: Secondary | ICD-10-CM | POA: Diagnosis not present

## 2016-06-26 DIAGNOSIS — Z888 Allergy status to other drugs, medicaments and biological substances status: Secondary | ICD-10-CM | POA: Diagnosis not present

## 2016-06-26 DIAGNOSIS — Z881 Allergy status to other antibiotic agents status: Secondary | ICD-10-CM | POA: Diagnosis not present

## 2016-06-26 DIAGNOSIS — G8918 Other acute postprocedural pain: Secondary | ICD-10-CM | POA: Diagnosis not present

## 2016-07-02 ENCOUNTER — Ambulatory Visit (INDEPENDENT_AMBULATORY_CARE_PROVIDER_SITE_OTHER): Payer: Medicare Other | Admitting: Licensed Clinical Social Worker

## 2016-07-02 DIAGNOSIS — F411 Generalized anxiety disorder: Secondary | ICD-10-CM | POA: Diagnosis not present

## 2016-07-02 DIAGNOSIS — G8929 Other chronic pain: Secondary | ICD-10-CM

## 2016-07-02 NOTE — Progress Notes (Signed)
   THERAPIST PROGRESS NOTE  Session Time: 11:05-12:00pm  Participation Level: Active  Behavioral Response: Well GroomedAlertAnxious   Type of Therapy: Individual Therapy  Treatment Goals addressed: Reduce worry and anxiety   Interventions: Psycho-ed about anxiety    Suicidal/Homicidal: Denied both  Therapist Interventions:  Educated patient about fight or flight and how it is activated whenever a person thinks there is a potential threat.  Explained that this happens whether the threat is real or not.  Emphasized that panic symptoms are not dangerous.   Discussed how patient is adjusting to a new normal when it comes to how her body functions and therefore she needs to adjust her thinking about herself.  Acknowledged she is in a process of discovering what she is still capable of doing.  Discussed how fear keeps her from engaging in activities.          Summary: Reported that since starting Lexapro about a week ago her mood has been "more down" and she has had an increase in crying spells.  Hoping these side effects lessen over time.   Met with her back surgeon last week.  He has told her he has "done all that he can do" for her and encourages her to "think positive" and push herself to stay active.   Was not familiar with the concept of fight or flight.  Acknowledged that it is hard for her to differentiate between legitimate health issues and bodily reactions due to anxiety.           Plan: Scheduled to return in mid-January.  Will formally teach the 4-7-8 breathing exercise.   Diagnosis:     Adjustment disorder with mixed anxiety and depressed mood                         Chronic pain    Armandina Stammer 07/02/2016

## 2016-07-10 DIAGNOSIS — Z79891 Long term (current) use of opiate analgesic: Secondary | ICD-10-CM | POA: Diagnosis not present

## 2016-07-10 DIAGNOSIS — M961 Postlaminectomy syndrome, not elsewhere classified: Secondary | ICD-10-CM | POA: Diagnosis not present

## 2016-07-10 DIAGNOSIS — G894 Chronic pain syndrome: Secondary | ICD-10-CM | POA: Diagnosis not present

## 2016-07-10 DIAGNOSIS — M4693 Unspecified inflammatory spondylopathy, cervicothoracic region: Secondary | ICD-10-CM | POA: Diagnosis not present

## 2016-07-14 ENCOUNTER — Ambulatory Visit (HOSPITAL_COMMUNITY): Payer: Self-pay | Admitting: Psychiatry

## 2016-07-14 DIAGNOSIS — Z981 Arthrodesis status: Secondary | ICD-10-CM | POA: Diagnosis not present

## 2016-07-14 DIAGNOSIS — M542 Cervicalgia: Secondary | ICD-10-CM | POA: Diagnosis not present

## 2016-07-15 DIAGNOSIS — R002 Palpitations: Secondary | ICD-10-CM | POA: Diagnosis not present

## 2016-07-15 DIAGNOSIS — I348 Other nonrheumatic mitral valve disorders: Secondary | ICD-10-CM | POA: Diagnosis not present

## 2016-07-15 DIAGNOSIS — Z7901 Long term (current) use of anticoagulants: Secondary | ICD-10-CM | POA: Diagnosis not present

## 2016-07-15 DIAGNOSIS — I480 Paroxysmal atrial fibrillation: Secondary | ICD-10-CM | POA: Diagnosis not present

## 2016-07-15 DIAGNOSIS — F410 Panic disorder [episodic paroxysmal anxiety] without agoraphobia: Secondary | ICD-10-CM | POA: Diagnosis not present

## 2016-07-22 ENCOUNTER — Encounter (HOSPITAL_COMMUNITY): Payer: Self-pay | Admitting: Psychiatry

## 2016-07-22 ENCOUNTER — Ambulatory Visit (INDEPENDENT_AMBULATORY_CARE_PROVIDER_SITE_OTHER): Payer: Medicare Other | Admitting: Psychiatry

## 2016-07-22 DIAGNOSIS — Z823 Family history of stroke: Secondary | ICD-10-CM

## 2016-07-22 DIAGNOSIS — F411 Generalized anxiety disorder: Secondary | ICD-10-CM | POA: Diagnosis not present

## 2016-07-22 DIAGNOSIS — F410 Panic disorder [episodic paroxysmal anxiety] without agoraphobia: Secondary | ICD-10-CM | POA: Diagnosis not present

## 2016-07-22 DIAGNOSIS — F0630 Mood disorder due to known physiological condition, unspecified: Secondary | ICD-10-CM | POA: Diagnosis not present

## 2016-07-22 DIAGNOSIS — Z808 Family history of malignant neoplasm of other organs or systems: Secondary | ICD-10-CM

## 2016-07-22 DIAGNOSIS — Z79899 Other long term (current) drug therapy: Secondary | ICD-10-CM

## 2016-07-22 DIAGNOSIS — Z79891 Long term (current) use of opiate analgesic: Secondary | ICD-10-CM

## 2016-07-22 DIAGNOSIS — Z833 Family history of diabetes mellitus: Secondary | ICD-10-CM | POA: Diagnosis not present

## 2016-07-22 DIAGNOSIS — Z87891 Personal history of nicotine dependence: Secondary | ICD-10-CM

## 2016-07-22 DIAGNOSIS — G8929 Other chronic pain: Secondary | ICD-10-CM

## 2016-07-22 DIAGNOSIS — Z888 Allergy status to other drugs, medicaments and biological substances status: Secondary | ICD-10-CM

## 2016-07-22 DIAGNOSIS — Z8249 Family history of ischemic heart disease and other diseases of the circulatory system: Secondary | ICD-10-CM

## 2016-07-22 DIAGNOSIS — Z882 Allergy status to sulfonamides status: Secondary | ICD-10-CM | POA: Diagnosis not present

## 2016-07-22 DIAGNOSIS — Z8261 Family history of arthritis: Secondary | ICD-10-CM

## 2016-07-22 DIAGNOSIS — Z9889 Other specified postprocedural states: Secondary | ICD-10-CM | POA: Diagnosis not present

## 2016-07-22 DIAGNOSIS — Z91040 Latex allergy status: Secondary | ICD-10-CM

## 2016-07-22 MED ORDER — SERTRALINE HCL 50 MG PO TABS: 50.0000 mg | ORAL_TABLET | Freq: Every day | ORAL | 1 refills | Status: DC

## 2016-07-22 MED ORDER — CLONAZEPAM 0.5 MG PO TABS: 0.2500 mg | ORAL_TABLET | Freq: Two times a day (BID) | ORAL | 1 refills | Status: DC | PRN

## 2016-07-22 NOTE — Progress Notes (Signed)
Patient ID: [name], female   DOB: 1945-10-09, 71 y.o.   MRN: [id number]  [facility]  Outpatient Follow up visit  Patient Identification: [name] MRN:  [id number] Date of Evaluation:  07/22/2016 Referral Source: Primary care Chief Complaint:   Chief Complaint    Follow-up     Visit Diagnosis:    ICD-9-CM ICD-10-CM   1. 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 GAD (generalized anxiety disorder) 300.02 F41.1   3. Other chronic pain 338.29 G89.29   4. Panic attacks 300.01 F41.0     History of Present Illness:  71 years old currently single Caucasian female initially  referred for management of possible depression, anxiety she suffers from pain because of spinal fusion surgeries and history of scoliosis.  Patient continues to have worries related to her physical health and pain that keeps her limited and anxious. She has atrial fibrillation and her heart monitoring was also done she will contact with the cardiologist if there is any other concern. Zoloft has helped someone anxiety along with the Klonopin combination but she was to increase it she remains anxious and at times feeling lonely and depressed   She has good support of her son she is on pain medication she is to follow with her pain doctor regarding any other thing needed that would help  No psychotic symptoms. Depression still they're relevant to her pain and her physical condition. She has been on different medications in the past but none of them worked. We did Gene testing and shows medication that she has been on before including Lexapro were okay related to her genetic  interaction  Aggravating factors;pain, scoliosis Modifying factors; her son and her pet  associated symptoms: insomnia and pain   Substance Abuse History in the last 12 months:  No.  Consequences of Substance Abuse: NA  Past Medical History:  Past Medical History:  Diagnosis Date  . Allergy   . Anxiety   . Benign  paroxysmal positional vertigo 06/08/2013  . Chronic a-fib ([place]) 08/07/2014  . Fracture of multiple ribs   . GERD (gastroesophageal reflux disease)   . Hair loss 04/12/2012  . Herpes   . IC (intermittent claudication) ([place])   . Interstitial cystitis 11/06/2011  . Kidney stone   . 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 Osteopenia 02/18/2012  . Osteoporosis   . PAF (paroxysmal atrial fibrillation) ([place]) 2012  . Peripheral neuropathy ([place]) 11/06/2011  . PMDD (premenstrual dysphoric disorder)   . Rotator cuff tear   . Vaginal delivery    ONE NSVD  . 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 BACK SURGERY     DISC FUSION L5,L6,L7  . BLADDER SURGERY     Bladder sling  . BREAST SURGERY     BREAST BIOPSY--RIGHT BENIGN  . BUNIONECTOMY    . HEAD & NECK SKIN LESION EXCISIONAL BIOPSY     melanoma  . NECK FUSION    . ROTATOR CUFF REPAIR  2012   RIGHT  . SPINAL FUSION  dec 2015   . VAGINAL HYSTERECTOMY     TVH    Family Psychiatric History: brother, sister has depression  Family History:  Family History  Problem Relation Age of Onset  . Diabetes Father   . Hyperlipidemia Sister   . Heart disease Sister   . Stroke Sister   . Diabetes Brother   . Hyperlipidemia Sister   . Heart disease Sister   .  Arthritis Mother   . Cancer Mother     uterus  . Heart disease Mother   . Diabetes Brother   . Heart Problems Brother     Social History:   Social History   Social History  . Marital status: Divorced    Spouse name: N/A  . Number of children: N/A  . Years of education: N/A   Social History Main Topics  . Smoking status: Former Smoker    Packs/day: 0.25    Years: 14.00    Types: Cigarettes    Quit date: 07/07/1984  . Smokeless tobacco: Never Used  . Alcohol use No  . Drug use: No  . Sexual activity: Not Asked   Other Topics Concern  . None   Social History Narrative  . None   Allergies:   Allergies  Allergen Reactions  . Contrast Media  [Iodinated Diagnostic Agents] Hives    unknown  . Erythromycin     unknown  . Latex     hives  . Levofloxacin Nausea And Vomiting  . Other     IV CONTRAST /"DYE".  . Oxycodone     Other reaction(s): Delusions (intolerance)  . Septra [Bactrim]     Hives   . Sulfa Antibiotics     rash  . Morphine Anxiety    Metabolic Disorder Labs: No results found for: HGBA1C, MPG No results found for: PROLACTIN Lab Results  Component Value Date   CHOL 214 (H) 04/21/2016   TRIG 81.0 04/21/2016   HDL 86.60 04/21/2016   CHOLHDL 2 04/21/2016   VLDL 16.2 04/21/2016   LDLCALC 111 (H) 04/21/2016   LDLCALC 102 (H) 10/01/2015     Current Medications: Current Outpatient Prescriptions  Medication Sig Dispense Refill  . acetaminophen (TYLENOL) 500 MG tablet Take 1,000 mg by mouth every 6 (six) hours as needed for mild pain.     . Calcium-Vitamin D-Vitamin K (VIACTIV PO) Take by mouth.    . clonazePAM (KLONOPIN) 0.5 MG tablet Take 0.5 tablets (0.25 mg total) by mouth 2 (two) times daily as needed for anxiety. 30 tablet 1  . diltiazem (CARDIZEM CD) 120 MG 24 hr capsule Take 1 capsule by mouth daily.    . meclizine (ANTIVERT) 12.5 MG tablet TAKE ONE TABLET BY MOUTH THREE TIMES DAILY AS NEEDED FOR DIZZINESS 15 tablet 0  . metoprolol tartrate (LOPRESSOR) 25 MG tablet Take 1 tablet (25 mg total) by mouth 2 (two) times daily. 60 tablet 11  . ondansetron (ZOFRAN) 4 MG tablet TAKE ONE TABLET BY MOUTH EVERY 8 HOURS AS NEEDED FOR NAUSEA OR VOMITING 20 tablet 3  . oxyCODONE (ROXICODONE) 5 MG/5ML solution 5-10 mls every 4-6 hours    . polyethylene glycol powder (GLYCOLAX/MIRALAX) powder     . Probiotic Product (RESTORA PO) Take by mouth.    . rivaroxaban (XARELTO) 20 MG TABS tablet Take 1 tablet by mouth daily.    . sertraline (ZOLOFT) 50 MG tablet Take 1 tablet (50 mg total) by mouth daily. 30 tablet 1  . valACYclovir (VALTREX) 500 MG tablet as needed.      No current facility-administered medications for  this visit.     Headaches: fluctuates Pain at the back of neck  Psychiatric Specialty Exam: Review of Systems  Constitutional: Negative for fever.  Gastrointestinal: Negative for nausea.  Musculoskeletal: Positive for back pain and neck pain.  Skin: Negative for rash.  Psychiatric/Behavioral: Positive for depression. Negative for substance abuse. The patient is nervous/anxious.  There were no vitals taken for this visit.There is no height or weight on file to calculate BMI.  General Appearance: Casual  Eye Contact:  Fair  Speech:  Normal Rate  Volume:  Normal  Mood: anxious  Affect:  constricted  Thought Process:  Goal Directed  Orientation:  Full (Time, Place, and Person)  Thought Content:  Rumination  Suicidal Thoughts:  No  Homicidal Thoughts:  No  Memory:  Immediate;   Fair Recent;   Fair  Judgement:  Fair  Insight:  Shallow  Psychomotor Activity:  Decreased  Concentration:  Concentration: Fair and Attention Span: Fair  Recall:  [employer] of Knowledge:Fair  Language: Fair  Akathisia:  Negative  Handed:  Right  AIMS (if indicated):    Assets:  Desire for Improvement  ADL's:  Intact  Cognition: WNL  Sleep:  Variable     Treatment Plan Summary: Medication management and Plan as follows   Mood disorder sec to [place]: fluctutaes: cotninue klonopine to ease with the anxiety. Increase zoloft 50mg  qd  Anxiety disorder; increase zoloft 50mg . Continue klonopine prn Prescriptions sent and printed.  Pain: managed by primary care.  More than 50% of 25 minutes spent in counseling and coordination of care including physical education collaboration. Review of side effects and Cortisporin therapy in regarding to her pain and questioned regarding to her coping skills and to keep herself busy   Merian Capron, MD 1/16/201810:45 AM

## 2016-07-24 ENCOUNTER — Ambulatory Visit (HOSPITAL_COMMUNITY): Payer: Self-pay | Admitting: Licensed Clinical Social Worker

## 2016-07-30 ENCOUNTER — Telehealth (HOSPITAL_COMMUNITY): Payer: Self-pay | Admitting: Psychiatry

## 2016-07-30 NOTE — Telephone Encounter (Signed)
Pt is feeling more depressed than she was feeling since she started taking the zoloft rx. Pt request to stop taking it or request another medication.   Please advise.

## 2016-07-31 NOTE — Telephone Encounter (Signed)
Can stop medication and call in few days if depression better or worse. Report  ED if urgent or suicidal concerns.

## 2016-08-01 DIAGNOSIS — M810 Age-related osteoporosis without current pathological fracture: Secondary | ICD-10-CM | POA: Diagnosis not present

## 2016-08-01 DIAGNOSIS — Z87442 Personal history of urinary calculi: Secondary | ICD-10-CM | POA: Diagnosis not present

## 2016-08-01 DIAGNOSIS — Z780 Asymptomatic menopausal state: Secondary | ICD-10-CM | POA: Diagnosis not present

## 2016-08-01 DIAGNOSIS — K219 Gastro-esophageal reflux disease without esophagitis: Secondary | ICD-10-CM | POA: Diagnosis not present

## 2016-08-01 DIAGNOSIS — Z9181 History of falling: Secondary | ICD-10-CM | POA: Diagnosis not present

## 2016-08-01 DIAGNOSIS — Z981 Arthrodesis status: Secondary | ICD-10-CM | POA: Diagnosis not present

## 2016-08-01 DIAGNOSIS — M4120 Other idiopathic scoliosis, site unspecified: Secondary | ICD-10-CM | POA: Diagnosis not present

## 2016-08-01 DIAGNOSIS — I480 Paroxysmal atrial fibrillation: Secondary | ICD-10-CM | POA: Diagnosis not present

## 2016-08-01 DIAGNOSIS — G609 Hereditary and idiopathic neuropathy, unspecified: Secondary | ICD-10-CM | POA: Diagnosis not present

## 2016-08-04 NOTE — Telephone Encounter (Signed)
Return telephone call to patient. Pt states her depression has leveled off and she is feeling better. Per Dr. De Nurse, please informed pt she may stop medication if depression is better or worse. Pt decline to stop medication and would like to wait until next apt to discuss medication changes with provider. Nothing further is needed at this time.

## 2016-08-05 ENCOUNTER — Ambulatory Visit (HOSPITAL_COMMUNITY): Payer: Self-pay | Admitting: Licensed Clinical Social Worker

## 2016-08-18 DIAGNOSIS — G894 Chronic pain syndrome: Secondary | ICD-10-CM | POA: Diagnosis not present

## 2016-08-18 DIAGNOSIS — M961 Postlaminectomy syndrome, not elsewhere classified: Secondary | ICD-10-CM | POA: Diagnosis not present

## 2016-08-18 DIAGNOSIS — M4693 Unspecified inflammatory spondylopathy, cervicothoracic region: Secondary | ICD-10-CM | POA: Diagnosis not present

## 2016-08-18 DIAGNOSIS — Z79891 Long term (current) use of opiate analgesic: Secondary | ICD-10-CM | POA: Diagnosis not present

## 2016-08-20 DIAGNOSIS — D692 Other nonthrombocytopenic purpura: Secondary | ICD-10-CM | POA: Diagnosis not present

## 2016-08-20 DIAGNOSIS — Z85828 Personal history of other malignant neoplasm of skin: Secondary | ICD-10-CM | POA: Diagnosis not present

## 2016-08-20 DIAGNOSIS — Z85820 Personal history of malignant melanoma of skin: Secondary | ICD-10-CM | POA: Diagnosis not present

## 2016-08-20 DIAGNOSIS — L820 Inflamed seborrheic keratosis: Secondary | ICD-10-CM | POA: Diagnosis not present

## 2016-08-22 ENCOUNTER — Ambulatory Visit (HOSPITAL_COMMUNITY): Payer: Self-pay | Admitting: Licensed Clinical Social Worker

## 2016-09-01 ENCOUNTER — Encounter (HOSPITAL_COMMUNITY): Payer: Self-pay | Admitting: Psychiatry

## 2016-09-01 ENCOUNTER — Ambulatory Visit (INDEPENDENT_AMBULATORY_CARE_PROVIDER_SITE_OTHER): Payer: Medicare Other | Admitting: Psychiatry

## 2016-09-01 VITALS — BP 122/80 | HR 67 | Resp 16 | Ht 66.0 in | Wt 126.0 lb

## 2016-09-01 DIAGNOSIS — G8929 Other chronic pain: Secondary | ICD-10-CM | POA: Diagnosis not present

## 2016-09-01 DIAGNOSIS — F411 Generalized anxiety disorder: Secondary | ICD-10-CM | POA: Diagnosis not present

## 2016-09-01 DIAGNOSIS — F0630 Mood disorder due to known physiological condition, unspecified: Secondary | ICD-10-CM | POA: Diagnosis not present

## 2016-09-01 DIAGNOSIS — Z79899 Other long term (current) drug therapy: Secondary | ICD-10-CM

## 2016-09-01 DIAGNOSIS — Z79891 Long term (current) use of opiate analgesic: Secondary | ICD-10-CM

## 2016-09-01 DIAGNOSIS — F410 Panic disorder [episodic paroxysmal anxiety] without agoraphobia: Secondary | ICD-10-CM

## 2016-09-01 DIAGNOSIS — Z87891 Personal history of nicotine dependence: Secondary | ICD-10-CM | POA: Diagnosis not present

## 2016-09-01 DIAGNOSIS — Z888 Allergy status to other drugs, medicaments and biological substances status: Secondary | ICD-10-CM | POA: Diagnosis not present

## 2016-09-01 DIAGNOSIS — Z882 Allergy status to sulfonamides status: Secondary | ICD-10-CM | POA: Diagnosis not present

## 2016-09-01 NOTE — Progress Notes (Signed)
Patient ID: [name], female   DOB: [birth year], 71 y.o.   MRN: [id number]  [facility]  Outpatient Follow up visit  Patient Identification: [name] MRN:  [id number] Date of Evaluation:  09/01/2016 Referral Source: Primary care Chief Complaint:   Chief Complaint    Follow-up     Visit Diagnosis:    ICD-9-CM ICD-10-CM   1. 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 GAD (generalized anxiety disorder) 300.02 F41.1   3. Other chronic pain 338.29 G89.29   4. Panic attacks 300.01 F41.0     History of Present Illness:  71 years old currently single Caucasian female initially  referred for management of possible depression, anxiety she suffers from pain because of spinal fusion surgeries and history of scoliosis.  Patient states the Zoloft seemingly is not helping much and she understands she had a lot of physical condition also she is putting an injection in her neck and the back for her pain there are some other changes in the treatment modality for pain. She remains positive but thinks she wants to do it without the medication because her anxiety and depression is more so related with her physical condition and pain and she does have a good support from her son.   She continues klonopine for anxiety and has atrial fibrillation.   No psychotic symptoms.  Aggravating factors;pain, scoliosis Modifying factors; her son and her pet  associated symptoms: insomnia and pain   Substance Abuse History in the last 12 months:  No.  Consequences of Substance Abuse: NA  Past Medical History:  Past Medical History:  Diagnosis Date  . Allergy   . Anxiety   . Benign paroxysmal positional vertigo 06/08/2013  . Chronic a-fib ([place]) 08/07/2014  . Fracture of multiple ribs   . GERD (gastroesophageal reflux disease)   . Hair loss 04/12/2012  . Herpes   . IC (intermittent claudication) ([place])   . Interstitial cystitis 11/06/2011  . Kidney stone   . 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  Osteopenia 02/18/2012  . Osteoporosis   . PAF (paroxysmal atrial fibrillation) ([place]) 2012  . Peripheral neuropathy ([place]) 11/06/2011  . PMDD (premenstrual dysphoric disorder)   . Rotator cuff tear   . Vaginal delivery    ONE NSVD  . 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 BACK SURGERY     DISC FUSION L5,L6,L7  . BLADDER SURGERY     Bladder sling  . BREAST SURGERY     BREAST BIOPSY--RIGHT BENIGN  . BUNIONECTOMY    . HEAD & NECK SKIN LESION EXCISIONAL BIOPSY     melanoma  . NECK FUSION    . ROTATOR CUFF REPAIR  2012   RIGHT  . SPINAL FUSION  dec 2015   . VAGINAL HYSTERECTOMY     TVH    Family Psychiatric History: brother, sister has depression  Family History:  Family History  Problem Relation Age of Onset  . Diabetes Father   . Hyperlipidemia Sister   . Heart disease Sister   . Stroke Sister   . Diabetes Brother   . Hyperlipidemia Sister   . Heart disease Sister   . Arthritis Mother   . Cancer Mother     uterus  . Heart disease Mother   . Diabetes Brother   . Heart Problems Brother     Social History:   Social History   Social History  . Marital status: Divorced  Spouse name: N/A  . Number of children: N/A  . Years of education: N/A   Social History Main Topics  . Smoking status: Former Smoker    Packs/day: 0.25    Years: 14.00    Types: Cigarettes    Quit date: 07/07/1984  . Smokeless tobacco: Never Used  . Alcohol use No  . Drug use: No  . Sexual activity: Not Asked   Other Topics Concern  . None   Social History Narrative  . None   Allergies:   Allergies  Allergen Reactions  . Contrast Media [Iodinated Diagnostic Agents] Hives    unknown  . Erythromycin     unknown  . Latex     hives  . Levofloxacin Nausea And Vomiting  . Other     IV CONTRAST /"DYE".  . Oxycodone     Other reaction(s): Delusions (intolerance)  . Septra [Bactrim]     Hives   . Sulfa Antibiotics     rash  . Morphine Anxiety     Metabolic Disorder Labs: No results found for: HGBA1C, MPG No results found for: PROLACTIN Lab Results  Component Value Date   CHOL 214 (H) 04/21/2016   TRIG 81.0 04/21/2016   HDL 86.60 04/21/2016   CHOLHDL 2 04/21/2016   VLDL 16.2 04/21/2016   LDLCALC 111 (H) 04/21/2016   LDLCALC 102 (H) 10/01/2015     Current Medications: Current Outpatient Prescriptions  Medication Sig Dispense Refill  . acetaminophen (TYLENOL) 500 MG tablet Take 1,000 mg by mouth every 6 (six) hours as needed for mild pain.     . Calcium-Vitamin D-Vitamin K (VIACTIV PO) Take by mouth.    . clonazePAM (KLONOPIN) 0.5 MG tablet Take 0.5 tablets (0.25 mg total) by mouth 2 (two) times daily as needed for anxiety. 30 tablet 1  . diltiazem (CARDIZEM CD) 120 MG 24 hr capsule Take 1 capsule by mouth daily.    . meclizine (ANTIVERT) 12.5 MG tablet TAKE ONE TABLET BY MOUTH THREE TIMES DAILY AS NEEDED FOR DIZZINESS 15 tablet 0  . metoprolol tartrate (LOPRESSOR) 25 MG tablet Take 1 tablet (25 mg total) by mouth 2 (two) times daily. 60 tablet 11  . ondansetron (ZOFRAN) 4 MG tablet TAKE ONE TABLET BY MOUTH EVERY 8 HOURS AS NEEDED FOR NAUSEA OR VOMITING 20 tablet 3  . oxyCODONE (ROXICODONE) 5 MG/5ML solution 5-10 mls every 4-6 hours    . polyethylene glycol powder (GLYCOLAX/MIRALAX) powder     . Probiotic Product (RESTORA PO) Take by mouth.    . rivaroxaban (XARELTO) 20 MG TABS tablet Take 1 tablet by mouth daily.    . valACYclovir (VALTREX) 500 MG tablet as needed.      No current facility-administered medications for this visit.     Headaches: fluctuates Pain at the back of neck  Psychiatric Specialty Exam: Review of Systems  Constitutional: Negative for fever.  Cardiovascular: Negative for chest pain.  Gastrointestinal: Negative for nausea.  Musculoskeletal: Positive for back pain and neck pain.  Skin: Negative for rash.  Psychiatric/Behavioral: Positive for depression. Negative for substance abuse.     Blood pressure 122/80, pulse 67, resp. rate 16, height 5\' 6"  (1.676 m), weight 126 lb (57.2 kg), SpO2 94 %.Body mass index is 20.34 kg/m.  General Appearance: Casual  Eye Contact:  Fair  Speech:  Normal Rate  Volume:  Normal  Mood: stressed   Affect:  reactive  Thought Process:  Goal Directed  Orientation:  Full (Time, Place, and Person)  Thought  Content:  Rumination  Suicidal Thoughts:  No  Homicidal Thoughts:  No  Memory:  Immediate;   Fair Recent;   Fair  Judgement:  Fair  Insight:  Shallow  Psychomotor Activity:  Decreased  Concentration:  Concentration: Fair and Attention Span: Fair  Recall:  [employer] of Knowledge:Fair  Language: Fair  Akathisia:  Negative  Handed:  Right  AIMS (if indicated):    Assets:  Desire for Improvement  ADL's:  Intact  Cognition: WNL  Sleep:  Variable     Treatment Plan Summary: Medication management and Plan as follows   Mood disorder sec to [place]: fluctutaes:does not want to be on anti depressant. Will taper down zoloft in next 3 days and stop.  Anxiety disorder; flucutates depending upon physical condition. Wants to be off zoloft. Can continue klonopine small dose.   Pain: managed by primary care.  Patient to follow with pain management.  Will follow up 2 months. Come earlier if wants to be back on some meds for depression. Says she plans small trips.  Keeping positive and will let us know for early visit.    Merian Capron, MD 2/26/20181:22 PM

## 2016-09-02 DIAGNOSIS — Z90710 Acquired absence of both cervix and uterus: Secondary | ICD-10-CM | POA: Diagnosis not present

## 2016-09-02 DIAGNOSIS — F329 Major depressive disorder, single episode, unspecified: Secondary | ICD-10-CM | POA: Diagnosis not present

## 2016-09-02 DIAGNOSIS — I4891 Unspecified atrial fibrillation: Secondary | ICD-10-CM | POA: Diagnosis not present

## 2016-09-02 DIAGNOSIS — Z7982 Long term (current) use of aspirin: Secondary | ICD-10-CM | POA: Diagnosis not present

## 2016-09-02 DIAGNOSIS — H8110 Benign paroxysmal vertigo, unspecified ear: Secondary | ICD-10-CM | POA: Diagnosis not present

## 2016-09-02 DIAGNOSIS — K219 Gastro-esophageal reflux disease without esophagitis: Secondary | ICD-10-CM | POA: Diagnosis not present

## 2016-09-02 DIAGNOSIS — G629 Polyneuropathy, unspecified: Secondary | ICD-10-CM | POA: Diagnosis not present

## 2016-09-02 DIAGNOSIS — Z87891 Personal history of nicotine dependence: Secondary | ICD-10-CM | POA: Diagnosis not present

## 2016-09-02 DIAGNOSIS — T84098A Other mechanical complication of other internal joint prosthesis, initial encounter: Secondary | ICD-10-CM | POA: Diagnosis not present

## 2016-09-02 DIAGNOSIS — M79652 Pain in left thigh: Secondary | ICD-10-CM | POA: Diagnosis not present

## 2016-09-02 DIAGNOSIS — Z885 Allergy status to narcotic agent status: Secondary | ICD-10-CM | POA: Diagnosis not present

## 2016-09-02 DIAGNOSIS — Z981 Arthrodesis status: Secondary | ICD-10-CM | POA: Diagnosis not present

## 2016-09-02 DIAGNOSIS — Z91041 Radiographic dye allergy status: Secondary | ICD-10-CM | POA: Diagnosis not present

## 2016-09-02 DIAGNOSIS — Z7901 Long term (current) use of anticoagulants: Secondary | ICD-10-CM | POA: Diagnosis not present

## 2016-09-02 DIAGNOSIS — T84296A Other mechanical complication of internal fixation device of vertebrae, initial encounter: Secondary | ICD-10-CM | POA: Diagnosis not present

## 2016-09-02 DIAGNOSIS — Z881 Allergy status to other antibiotic agents status: Secondary | ICD-10-CM | POA: Diagnosis not present

## 2016-09-02 DIAGNOSIS — Z882 Allergy status to sulfonamides status: Secondary | ICD-10-CM | POA: Diagnosis not present

## 2016-09-02 DIAGNOSIS — M5136 Other intervertebral disc degeneration, lumbar region: Secondary | ICD-10-CM | POA: Diagnosis not present

## 2016-09-02 DIAGNOSIS — M545 Low back pain: Secondary | ICD-10-CM | POA: Diagnosis not present

## 2016-09-03 DIAGNOSIS — S22040D Wedge compression fracture of fourth thoracic vertebra, subsequent encounter for fracture with routine healing: Secondary | ICD-10-CM | POA: Diagnosis not present

## 2016-09-03 DIAGNOSIS — S2243XD Multiple fractures of ribs, bilateral, subsequent encounter for fracture with routine healing: Secondary | ICD-10-CM | POA: Diagnosis not present

## 2016-09-03 DIAGNOSIS — T84216A Breakdown (mechanical) of internal fixation device of vertebrae, initial encounter: Secondary | ICD-10-CM | POA: Diagnosis not present

## 2016-09-03 DIAGNOSIS — W19XXXA Unspecified fall, initial encounter: Secondary | ICD-10-CM | POA: Diagnosis not present

## 2016-09-03 DIAGNOSIS — Z981 Arthrodesis status: Secondary | ICD-10-CM | POA: Diagnosis not present

## 2016-09-03 DIAGNOSIS — M438X4 Other specified deforming dorsopathies, thoracic region: Secondary | ICD-10-CM | POA: Diagnosis not present

## 2016-09-03 DIAGNOSIS — Z87891 Personal history of nicotine dependence: Secondary | ICD-10-CM | POA: Diagnosis not present

## 2016-09-03 DIAGNOSIS — S22030D Wedge compression fracture of third thoracic vertebra, subsequent encounter for fracture with routine healing: Secondary | ICD-10-CM | POA: Diagnosis not present

## 2016-09-04 DIAGNOSIS — W19XXXA Unspecified fall, initial encounter: Secondary | ICD-10-CM | POA: Diagnosis not present

## 2016-09-04 DIAGNOSIS — M545 Low back pain: Secondary | ICD-10-CM | POA: Diagnosis not present

## 2016-09-06 DIAGNOSIS — M8588 Other specified disorders of bone density and structure, other site: Secondary | ICD-10-CM | POA: Diagnosis not present

## 2016-09-06 DIAGNOSIS — Z981 Arthrodesis status: Secondary | ICD-10-CM | POA: Diagnosis not present

## 2016-09-06 DIAGNOSIS — F419 Anxiety disorder, unspecified: Secondary | ICD-10-CM | POA: Diagnosis not present

## 2016-09-06 DIAGNOSIS — G8929 Other chronic pain: Secondary | ICD-10-CM | POA: Diagnosis not present

## 2016-09-06 DIAGNOSIS — T84216A Breakdown (mechanical) of internal fixation device of vertebrae, initial encounter: Secondary | ICD-10-CM | POA: Diagnosis not present

## 2016-09-06 DIAGNOSIS — K219 Gastro-esophageal reflux disease without esophagitis: Secondary | ICD-10-CM | POA: Diagnosis not present

## 2016-09-06 DIAGNOSIS — M533 Sacrococcygeal disorders, not elsewhere classified: Secondary | ICD-10-CM | POA: Diagnosis not present

## 2016-09-06 DIAGNOSIS — M4316 Spondylolisthesis, lumbar region: Secondary | ICD-10-CM | POA: Diagnosis not present

## 2016-09-06 DIAGNOSIS — M545 Low back pain: Secondary | ICD-10-CM | POA: Diagnosis not present

## 2016-09-06 DIAGNOSIS — M8580 Other specified disorders of bone density and structure, unspecified site: Secondary | ICD-10-CM | POA: Diagnosis not present

## 2016-09-06 DIAGNOSIS — M5440 Lumbago with sciatica, unspecified side: Secondary | ICD-10-CM | POA: Diagnosis not present

## 2016-09-06 DIAGNOSIS — R9431 Abnormal electrocardiogram [ECG] [EKG]: Secondary | ICD-10-CM | POA: Diagnosis not present

## 2016-09-06 DIAGNOSIS — M5382 Other specified dorsopathies, cervical region: Secondary | ICD-10-CM | POA: Diagnosis not present

## 2016-09-06 DIAGNOSIS — I480 Paroxysmal atrial fibrillation: Secondary | ICD-10-CM | POA: Diagnosis not present

## 2016-09-06 DIAGNOSIS — M5441 Lumbago with sciatica, right side: Secondary | ICD-10-CM | POA: Diagnosis not present

## 2016-09-06 DIAGNOSIS — M542 Cervicalgia: Secondary | ICD-10-CM | POA: Diagnosis not present

## 2016-09-06 DIAGNOSIS — N301 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: Secondary | ICD-10-CM | POA: Diagnosis not present

## 2016-09-06 DIAGNOSIS — M5442 Lumbago with sciatica, left side: Secondary | ICD-10-CM | POA: Diagnosis not present

## 2016-09-06 DIAGNOSIS — G629 Polyneuropathy, unspecified: Secondary | ICD-10-CM | POA: Diagnosis not present

## 2016-09-07 DIAGNOSIS — M8580 Other specified disorders of bone density and structure, unspecified site: Secondary | ICD-10-CM | POA: Diagnosis not present

## 2016-09-07 DIAGNOSIS — M4312 Spondylolisthesis, cervical region: Secondary | ICD-10-CM | POA: Diagnosis not present

## 2016-09-07 DIAGNOSIS — G8929 Other chronic pain: Secondary | ICD-10-CM | POA: Diagnosis not present

## 2016-09-07 DIAGNOSIS — K219 Gastro-esophageal reflux disease without esophagitis: Secondary | ICD-10-CM | POA: Diagnosis not present

## 2016-09-07 DIAGNOSIS — I480 Paroxysmal atrial fibrillation: Secondary | ICD-10-CM | POA: Diagnosis not present

## 2016-09-07 DIAGNOSIS — F419 Anxiety disorder, unspecified: Secondary | ICD-10-CM | POA: Diagnosis not present

## 2016-09-07 DIAGNOSIS — G629 Polyneuropathy, unspecified: Secondary | ICD-10-CM | POA: Diagnosis not present

## 2016-09-07 DIAGNOSIS — Z981 Arthrodesis status: Secondary | ICD-10-CM | POA: Diagnosis not present

## 2016-09-07 DIAGNOSIS — N301 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: Secondary | ICD-10-CM | POA: Diagnosis not present

## 2016-09-07 DIAGNOSIS — S22030D Wedge compression fracture of third thoracic vertebra, subsequent encounter for fracture with routine healing: Secondary | ICD-10-CM | POA: Diagnosis not present

## 2016-09-07 DIAGNOSIS — M545 Low back pain: Secondary | ICD-10-CM | POA: Diagnosis not present

## 2016-09-07 DIAGNOSIS — S22040D Wedge compression fracture of fourth thoracic vertebra, subsequent encounter for fracture with routine healing: Secondary | ICD-10-CM | POA: Diagnosis not present

## 2016-09-07 DIAGNOSIS — S2242XD Multiple fractures of ribs, left side, subsequent encounter for fracture with routine healing: Secondary | ICD-10-CM | POA: Diagnosis not present

## 2016-09-08 DIAGNOSIS — M545 Low back pain: Secondary | ICD-10-CM | POA: Diagnosis not present

## 2016-09-08 DIAGNOSIS — N301 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: Secondary | ICD-10-CM | POA: Diagnosis not present

## 2016-09-08 DIAGNOSIS — F419 Anxiety disorder, unspecified: Secondary | ICD-10-CM | POA: Diagnosis not present

## 2016-09-08 DIAGNOSIS — G629 Polyneuropathy, unspecified: Secondary | ICD-10-CM | POA: Diagnosis not present

## 2016-09-08 DIAGNOSIS — G8929 Other chronic pain: Secondary | ICD-10-CM | POA: Diagnosis not present

## 2016-09-08 DIAGNOSIS — K219 Gastro-esophageal reflux disease without esophagitis: Secondary | ICD-10-CM | POA: Diagnosis not present

## 2016-09-08 DIAGNOSIS — I480 Paroxysmal atrial fibrillation: Secondary | ICD-10-CM | POA: Diagnosis not present

## 2016-09-09 DIAGNOSIS — M545 Low back pain: Secondary | ICD-10-CM | POA: Diagnosis not present

## 2016-09-09 DIAGNOSIS — F419 Anxiety disorder, unspecified: Secondary | ICD-10-CM | POA: Diagnosis not present

## 2016-09-09 DIAGNOSIS — K219 Gastro-esophageal reflux disease without esophagitis: Secondary | ICD-10-CM | POA: Diagnosis not present

## 2016-09-09 DIAGNOSIS — I480 Paroxysmal atrial fibrillation: Secondary | ICD-10-CM | POA: Diagnosis not present

## 2016-09-09 DIAGNOSIS — G8929 Other chronic pain: Secondary | ICD-10-CM | POA: Diagnosis not present

## 2016-09-09 DIAGNOSIS — Z7901 Long term (current) use of anticoagulants: Secondary | ICD-10-CM | POA: Diagnosis not present

## 2016-09-09 DIAGNOSIS — G629 Polyneuropathy, unspecified: Secondary | ICD-10-CM | POA: Diagnosis not present

## 2016-09-10 ENCOUNTER — Ambulatory Visit (HOSPITAL_COMMUNITY): Payer: Self-pay | Admitting: Psychiatry

## 2016-09-12 ENCOUNTER — Telehealth: Payer: Self-pay | Admitting: *Deleted

## 2016-09-12 NOTE — Telephone Encounter (Signed)
noted 

## 2016-09-12 NOTE — Telephone Encounter (Addendum)
[facility] called and stated that patient has refused home health.    They are required to let the physician know when a patient refuses a referral.

## 2016-09-15 ENCOUNTER — Ambulatory Visit (HOSPITAL_COMMUNITY): Payer: Self-pay | Admitting: Licensed Clinical Social Worker

## 2016-09-23 DIAGNOSIS — M79605 Pain in left leg: Secondary | ICD-10-CM | POA: Diagnosis not present

## 2016-09-23 DIAGNOSIS — Z79891 Long term (current) use of opiate analgesic: Secondary | ICD-10-CM | POA: Diagnosis not present

## 2016-09-23 DIAGNOSIS — M545 Low back pain: Secondary | ICD-10-CM | POA: Diagnosis not present

## 2016-09-23 DIAGNOSIS — M960 Pseudarthrosis after fusion or arthrodesis: Secondary | ICD-10-CM | POA: Diagnosis not present

## 2016-09-23 DIAGNOSIS — T84216A Breakdown (mechanical) of internal fixation device of vertebrae, initial encounter: Secondary | ICD-10-CM | POA: Diagnosis not present

## 2016-09-23 DIAGNOSIS — M79604 Pain in right leg: Secondary | ICD-10-CM | POA: Diagnosis not present

## 2016-09-23 DIAGNOSIS — Z981 Arthrodesis status: Secondary | ICD-10-CM | POA: Diagnosis not present

## 2016-10-01 DIAGNOSIS — E785 Hyperlipidemia, unspecified: Secondary | ICD-10-CM | POA: Diagnosis present

## 2016-10-01 DIAGNOSIS — M542 Cervicalgia: Secondary | ICD-10-CM | POA: Diagnosis present

## 2016-10-01 DIAGNOSIS — I10 Essential (primary) hypertension: Secondary | ICD-10-CM | POA: Diagnosis present

## 2016-10-01 DIAGNOSIS — I272 Pulmonary hypertension, unspecified: Secondary | ICD-10-CM | POA: Diagnosis present

## 2016-10-01 DIAGNOSIS — M960 Pseudarthrosis after fusion or arthrodesis: Secondary | ICD-10-CM | POA: Diagnosis not present

## 2016-10-01 DIAGNOSIS — N301 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: Secondary | ICD-10-CM | POA: Diagnosis not present

## 2016-10-01 DIAGNOSIS — R2689 Other abnormalities of gait and mobility: Secondary | ICD-10-CM | POA: Diagnosis not present

## 2016-10-01 DIAGNOSIS — S32009K Unspecified fracture of unspecified lumbar vertebra, subsequent encounter for fracture with nonunion: Secondary | ICD-10-CM | POA: Diagnosis not present

## 2016-10-01 DIAGNOSIS — F411 Generalized anxiety disorder: Secondary | ICD-10-CM | POA: Diagnosis present

## 2016-10-01 DIAGNOSIS — I480 Paroxysmal atrial fibrillation: Secondary | ICD-10-CM | POA: Diagnosis present

## 2016-10-01 DIAGNOSIS — G8929 Other chronic pain: Secondary | ICD-10-CM | POA: Diagnosis present

## 2016-10-01 DIAGNOSIS — M8588 Other specified disorders of bone density and structure, other site: Secondary | ICD-10-CM | POA: Diagnosis not present

## 2016-10-01 DIAGNOSIS — Z981 Arthrodesis status: Secondary | ICD-10-CM | POA: Diagnosis not present

## 2016-10-09 DIAGNOSIS — Z4789 Encounter for other orthopedic aftercare: Secondary | ICD-10-CM | POA: Diagnosis not present

## 2016-10-09 DIAGNOSIS — G609 Hereditary and idiopathic neuropathy, unspecified: Secondary | ICD-10-CM | POA: Diagnosis not present

## 2016-10-09 DIAGNOSIS — M4120 Other idiopathic scoliosis, site unspecified: Secondary | ICD-10-CM | POA: Diagnosis not present

## 2016-10-09 DIAGNOSIS — I480 Paroxysmal atrial fibrillation: Secondary | ICD-10-CM | POA: Diagnosis not present

## 2016-10-09 DIAGNOSIS — M1990 Unspecified osteoarthritis, unspecified site: Secondary | ICD-10-CM | POA: Diagnosis not present

## 2016-10-09 DIAGNOSIS — I272 Pulmonary hypertension, unspecified: Secondary | ICD-10-CM | POA: Diagnosis not present

## 2016-10-13 DIAGNOSIS — M1990 Unspecified osteoarthritis, unspecified site: Secondary | ICD-10-CM | POA: Diagnosis not present

## 2016-10-13 DIAGNOSIS — I480 Paroxysmal atrial fibrillation: Secondary | ICD-10-CM | POA: Diagnosis not present

## 2016-10-13 DIAGNOSIS — Z4789 Encounter for other orthopedic aftercare: Secondary | ICD-10-CM | POA: Diagnosis not present

## 2016-10-13 DIAGNOSIS — I272 Pulmonary hypertension, unspecified: Secondary | ICD-10-CM | POA: Diagnosis not present

## 2016-10-13 DIAGNOSIS — G609 Hereditary and idiopathic neuropathy, unspecified: Secondary | ICD-10-CM | POA: Diagnosis not present

## 2016-10-13 DIAGNOSIS — M4120 Other idiopathic scoliosis, site unspecified: Secondary | ICD-10-CM | POA: Diagnosis not present

## 2016-10-15 DIAGNOSIS — Z981 Arthrodesis status: Secondary | ICD-10-CM | POA: Diagnosis not present

## 2016-10-15 DIAGNOSIS — Z4789 Encounter for other orthopedic aftercare: Secondary | ICD-10-CM | POA: Diagnosis not present

## 2016-10-16 DIAGNOSIS — Z4789 Encounter for other orthopedic aftercare: Secondary | ICD-10-CM | POA: Diagnosis not present

## 2016-10-16 DIAGNOSIS — M4120 Other idiopathic scoliosis, site unspecified: Secondary | ICD-10-CM | POA: Diagnosis not present

## 2016-10-16 DIAGNOSIS — I272 Pulmonary hypertension, unspecified: Secondary | ICD-10-CM | POA: Diagnosis not present

## 2016-10-16 DIAGNOSIS — I480 Paroxysmal atrial fibrillation: Secondary | ICD-10-CM | POA: Diagnosis not present

## 2016-10-16 DIAGNOSIS — G609 Hereditary and idiopathic neuropathy, unspecified: Secondary | ICD-10-CM | POA: Diagnosis not present

## 2016-10-16 DIAGNOSIS — M1990 Unspecified osteoarthritis, unspecified site: Secondary | ICD-10-CM | POA: Diagnosis not present

## 2016-10-20 DIAGNOSIS — M4120 Other idiopathic scoliosis, site unspecified: Secondary | ICD-10-CM | POA: Diagnosis not present

## 2016-10-20 DIAGNOSIS — G609 Hereditary and idiopathic neuropathy, unspecified: Secondary | ICD-10-CM | POA: Diagnosis not present

## 2016-10-20 DIAGNOSIS — I480 Paroxysmal atrial fibrillation: Secondary | ICD-10-CM | POA: Diagnosis not present

## 2016-10-20 DIAGNOSIS — Z4789 Encounter for other orthopedic aftercare: Secondary | ICD-10-CM | POA: Diagnosis not present

## 2016-10-20 DIAGNOSIS — I272 Pulmonary hypertension, unspecified: Secondary | ICD-10-CM | POA: Diagnosis not present

## 2016-10-20 DIAGNOSIS — M1990 Unspecified osteoarthritis, unspecified site: Secondary | ICD-10-CM | POA: Diagnosis not present

## 2016-10-22 DIAGNOSIS — Z4789 Encounter for other orthopedic aftercare: Secondary | ICD-10-CM | POA: Diagnosis not present

## 2016-10-22 DIAGNOSIS — I480 Paroxysmal atrial fibrillation: Secondary | ICD-10-CM | POA: Diagnosis not present

## 2016-10-22 DIAGNOSIS — M4120 Other idiopathic scoliosis, site unspecified: Secondary | ICD-10-CM | POA: Diagnosis not present

## 2016-10-22 DIAGNOSIS — I272 Pulmonary hypertension, unspecified: Secondary | ICD-10-CM | POA: Diagnosis not present

## 2016-10-22 DIAGNOSIS — M1990 Unspecified osteoarthritis, unspecified site: Secondary | ICD-10-CM | POA: Diagnosis not present

## 2016-10-22 DIAGNOSIS — G609 Hereditary and idiopathic neuropathy, unspecified: Secondary | ICD-10-CM | POA: Diagnosis not present

## 2016-10-28 DIAGNOSIS — M1990 Unspecified osteoarthritis, unspecified site: Secondary | ICD-10-CM | POA: Diagnosis not present

## 2016-10-28 DIAGNOSIS — M4120 Other idiopathic scoliosis, site unspecified: Secondary | ICD-10-CM | POA: Diagnosis not present

## 2016-10-28 DIAGNOSIS — I272 Pulmonary hypertension, unspecified: Secondary | ICD-10-CM | POA: Diagnosis not present

## 2016-10-28 DIAGNOSIS — Z4789 Encounter for other orthopedic aftercare: Secondary | ICD-10-CM | POA: Diagnosis not present

## 2016-10-28 DIAGNOSIS — I480 Paroxysmal atrial fibrillation: Secondary | ICD-10-CM | POA: Diagnosis not present

## 2016-10-28 DIAGNOSIS — G609 Hereditary and idiopathic neuropathy, unspecified: Secondary | ICD-10-CM | POA: Diagnosis not present

## 2016-11-04 DIAGNOSIS — Z4789 Encounter for other orthopedic aftercare: Secondary | ICD-10-CM | POA: Diagnosis not present

## 2016-11-04 DIAGNOSIS — I272 Pulmonary hypertension, unspecified: Secondary | ICD-10-CM | POA: Diagnosis not present

## 2016-11-04 DIAGNOSIS — M1990 Unspecified osteoarthritis, unspecified site: Secondary | ICD-10-CM | POA: Diagnosis not present

## 2016-11-04 DIAGNOSIS — I480 Paroxysmal atrial fibrillation: Secondary | ICD-10-CM | POA: Diagnosis not present

## 2016-11-04 DIAGNOSIS — G609 Hereditary and idiopathic neuropathy, unspecified: Secondary | ICD-10-CM | POA: Diagnosis not present

## 2016-11-04 DIAGNOSIS — M4120 Other idiopathic scoliosis, site unspecified: Secondary | ICD-10-CM | POA: Diagnosis not present

## 2016-11-06 DIAGNOSIS — Z4789 Encounter for other orthopedic aftercare: Secondary | ICD-10-CM | POA: Diagnosis not present

## 2016-11-06 DIAGNOSIS — G609 Hereditary and idiopathic neuropathy, unspecified: Secondary | ICD-10-CM | POA: Diagnosis not present

## 2016-11-06 DIAGNOSIS — M4120 Other idiopathic scoliosis, site unspecified: Secondary | ICD-10-CM | POA: Diagnosis not present

## 2016-11-06 DIAGNOSIS — I272 Pulmonary hypertension, unspecified: Secondary | ICD-10-CM | POA: Diagnosis not present

## 2016-11-06 DIAGNOSIS — I480 Paroxysmal atrial fibrillation: Secondary | ICD-10-CM | POA: Diagnosis not present

## 2016-11-06 DIAGNOSIS — M1990 Unspecified osteoarthritis, unspecified site: Secondary | ICD-10-CM | POA: Diagnosis not present

## 2016-11-11 DIAGNOSIS — I272 Pulmonary hypertension, unspecified: Secondary | ICD-10-CM | POA: Diagnosis not present

## 2016-11-11 DIAGNOSIS — M1990 Unspecified osteoarthritis, unspecified site: Secondary | ICD-10-CM | POA: Diagnosis not present

## 2016-11-11 DIAGNOSIS — Z4789 Encounter for other orthopedic aftercare: Secondary | ICD-10-CM | POA: Diagnosis not present

## 2016-11-11 DIAGNOSIS — I480 Paroxysmal atrial fibrillation: Secondary | ICD-10-CM | POA: Diagnosis not present

## 2016-11-11 DIAGNOSIS — M4120 Other idiopathic scoliosis, site unspecified: Secondary | ICD-10-CM | POA: Diagnosis not present

## 2016-11-11 DIAGNOSIS — G609 Hereditary and idiopathic neuropathy, unspecified: Secondary | ICD-10-CM | POA: Diagnosis not present

## 2016-11-13 DIAGNOSIS — M1990 Unspecified osteoarthritis, unspecified site: Secondary | ICD-10-CM | POA: Diagnosis not present

## 2016-11-13 DIAGNOSIS — Z4789 Encounter for other orthopedic aftercare: Secondary | ICD-10-CM | POA: Diagnosis not present

## 2016-11-13 DIAGNOSIS — G609 Hereditary and idiopathic neuropathy, unspecified: Secondary | ICD-10-CM | POA: Diagnosis not present

## 2016-11-13 DIAGNOSIS — I480 Paroxysmal atrial fibrillation: Secondary | ICD-10-CM | POA: Diagnosis not present

## 2016-11-13 DIAGNOSIS — I272 Pulmonary hypertension, unspecified: Secondary | ICD-10-CM | POA: Diagnosis not present

## 2016-11-13 DIAGNOSIS — M4120 Other idiopathic scoliosis, site unspecified: Secondary | ICD-10-CM | POA: Diagnosis not present

## 2016-11-14 DIAGNOSIS — Z4789 Encounter for other orthopedic aftercare: Secondary | ICD-10-CM | POA: Diagnosis not present

## 2016-11-14 DIAGNOSIS — S22040D Wedge compression fracture of fourth thoracic vertebra, subsequent encounter for fracture with routine healing: Secondary | ICD-10-CM | POA: Diagnosis not present

## 2016-11-14 DIAGNOSIS — M7989 Other specified soft tissue disorders: Secondary | ICD-10-CM | POA: Diagnosis not present

## 2016-11-14 DIAGNOSIS — M79605 Pain in left leg: Secondary | ICD-10-CM | POA: Diagnosis not present

## 2016-11-14 DIAGNOSIS — T84216A Breakdown (mechanical) of internal fixation device of vertebrae, initial encounter: Secondary | ICD-10-CM | POA: Diagnosis not present

## 2016-11-14 DIAGNOSIS — M545 Low back pain: Secondary | ICD-10-CM | POA: Diagnosis not present

## 2016-11-14 DIAGNOSIS — M8580 Other specified disorders of bone density and structure, unspecified site: Secondary | ICD-10-CM | POA: Diagnosis not present

## 2016-11-14 DIAGNOSIS — Z981 Arthrodesis status: Secondary | ICD-10-CM | POA: Diagnosis not present

## 2016-11-18 ENCOUNTER — Ambulatory Visit (HOSPITAL_COMMUNITY): Payer: Self-pay | Admitting: Psychiatry

## 2016-11-18 DIAGNOSIS — Z4789 Encounter for other orthopedic aftercare: Secondary | ICD-10-CM | POA: Diagnosis not present

## 2016-11-18 DIAGNOSIS — M4120 Other idiopathic scoliosis, site unspecified: Secondary | ICD-10-CM | POA: Diagnosis not present

## 2016-11-18 DIAGNOSIS — I272 Pulmonary hypertension, unspecified: Secondary | ICD-10-CM | POA: Diagnosis not present

## 2016-11-18 DIAGNOSIS — I480 Paroxysmal atrial fibrillation: Secondary | ICD-10-CM | POA: Diagnosis not present

## 2016-11-18 DIAGNOSIS — M1990 Unspecified osteoarthritis, unspecified site: Secondary | ICD-10-CM | POA: Diagnosis not present

## 2016-11-18 DIAGNOSIS — G609 Hereditary and idiopathic neuropathy, unspecified: Secondary | ICD-10-CM | POA: Diagnosis not present

## 2016-11-19 ENCOUNTER — Encounter: Payer: Self-pay | Admitting: Gynecology

## 2016-11-19 DIAGNOSIS — M79605 Pain in left leg: Secondary | ICD-10-CM | POA: Diagnosis not present

## 2016-11-20 DIAGNOSIS — I272 Pulmonary hypertension, unspecified: Secondary | ICD-10-CM | POA: Diagnosis not present

## 2016-11-20 DIAGNOSIS — M4120 Other idiopathic scoliosis, site unspecified: Secondary | ICD-10-CM | POA: Diagnosis not present

## 2016-11-20 DIAGNOSIS — M1990 Unspecified osteoarthritis, unspecified site: Secondary | ICD-10-CM | POA: Diagnosis not present

## 2016-11-20 DIAGNOSIS — Z4789 Encounter for other orthopedic aftercare: Secondary | ICD-10-CM | POA: Diagnosis not present

## 2016-11-20 DIAGNOSIS — G609 Hereditary and idiopathic neuropathy, unspecified: Secondary | ICD-10-CM | POA: Diagnosis not present

## 2016-11-20 DIAGNOSIS — I480 Paroxysmal atrial fibrillation: Secondary | ICD-10-CM | POA: Diagnosis not present

## 2016-11-27 DIAGNOSIS — M1990 Unspecified osteoarthritis, unspecified site: Secondary | ICD-10-CM | POA: Diagnosis not present

## 2016-11-27 DIAGNOSIS — G609 Hereditary and idiopathic neuropathy, unspecified: Secondary | ICD-10-CM | POA: Diagnosis not present

## 2016-11-27 DIAGNOSIS — Z4789 Encounter for other orthopedic aftercare: Secondary | ICD-10-CM | POA: Diagnosis not present

## 2016-11-27 DIAGNOSIS — M4120 Other idiopathic scoliosis, site unspecified: Secondary | ICD-10-CM | POA: Diagnosis not present

## 2016-11-27 DIAGNOSIS — I272 Pulmonary hypertension, unspecified: Secondary | ICD-10-CM | POA: Diagnosis not present

## 2016-11-27 DIAGNOSIS — I480 Paroxysmal atrial fibrillation: Secondary | ICD-10-CM | POA: Diagnosis not present

## 2016-12-02 DIAGNOSIS — M4120 Other idiopathic scoliosis, site unspecified: Secondary | ICD-10-CM | POA: Diagnosis not present

## 2016-12-02 DIAGNOSIS — G609 Hereditary and idiopathic neuropathy, unspecified: Secondary | ICD-10-CM | POA: Diagnosis not present

## 2016-12-02 DIAGNOSIS — I272 Pulmonary hypertension, unspecified: Secondary | ICD-10-CM | POA: Diagnosis not present

## 2016-12-02 DIAGNOSIS — M1990 Unspecified osteoarthritis, unspecified site: Secondary | ICD-10-CM | POA: Diagnosis not present

## 2016-12-02 DIAGNOSIS — Z4789 Encounter for other orthopedic aftercare: Secondary | ICD-10-CM | POA: Diagnosis not present

## 2016-12-02 DIAGNOSIS — I480 Paroxysmal atrial fibrillation: Secondary | ICD-10-CM | POA: Diagnosis not present

## 2016-12-03 DIAGNOSIS — M961 Postlaminectomy syndrome, not elsewhere classified: Secondary | ICD-10-CM | POA: Diagnosis not present

## 2016-12-03 DIAGNOSIS — M4693 Unspecified inflammatory spondylopathy, cervicothoracic region: Secondary | ICD-10-CM | POA: Diagnosis not present

## 2016-12-03 DIAGNOSIS — Z79891 Long term (current) use of opiate analgesic: Secondary | ICD-10-CM | POA: Diagnosis not present

## 2016-12-03 DIAGNOSIS — G894 Chronic pain syndrome: Secondary | ICD-10-CM | POA: Diagnosis not present

## 2016-12-04 DIAGNOSIS — Z4789 Encounter for other orthopedic aftercare: Secondary | ICD-10-CM | POA: Diagnosis not present

## 2016-12-04 DIAGNOSIS — I480 Paroxysmal atrial fibrillation: Secondary | ICD-10-CM | POA: Diagnosis not present

## 2016-12-04 DIAGNOSIS — I272 Pulmonary hypertension, unspecified: Secondary | ICD-10-CM | POA: Diagnosis not present

## 2016-12-04 DIAGNOSIS — G609 Hereditary and idiopathic neuropathy, unspecified: Secondary | ICD-10-CM | POA: Diagnosis not present

## 2016-12-04 DIAGNOSIS — M4120 Other idiopathic scoliosis, site unspecified: Secondary | ICD-10-CM | POA: Diagnosis not present

## 2016-12-04 DIAGNOSIS — M1990 Unspecified osteoarthritis, unspecified site: Secondary | ICD-10-CM | POA: Diagnosis not present

## 2016-12-11 DIAGNOSIS — Z4789 Encounter for other orthopedic aftercare: Secondary | ICD-10-CM | POA: Diagnosis not present

## 2016-12-11 DIAGNOSIS — Z8739 Personal history of other diseases of the musculoskeletal system and connective tissue: Secondary | ICD-10-CM | POA: Diagnosis not present

## 2016-12-11 DIAGNOSIS — Z87891 Personal history of nicotine dependence: Secondary | ICD-10-CM | POA: Diagnosis not present

## 2016-12-11 DIAGNOSIS — Z882 Allergy status to sulfonamides status: Secondary | ICD-10-CM | POA: Diagnosis not present

## 2016-12-11 DIAGNOSIS — Z91040 Latex allergy status: Secondary | ICD-10-CM | POA: Diagnosis not present

## 2016-12-11 DIAGNOSIS — Z881 Allergy status to other antibiotic agents status: Secondary | ICD-10-CM | POA: Diagnosis not present

## 2016-12-11 DIAGNOSIS — I4891 Unspecified atrial fibrillation: Secondary | ICD-10-CM | POA: Diagnosis not present

## 2016-12-11 DIAGNOSIS — Z885 Allergy status to narcotic agent status: Secondary | ICD-10-CM | POA: Diagnosis not present

## 2016-12-11 DIAGNOSIS — Z7901 Long term (current) use of anticoagulants: Secondary | ICD-10-CM | POA: Diagnosis not present

## 2016-12-11 DIAGNOSIS — Z981 Arthrodesis status: Secondary | ICD-10-CM | POA: Diagnosis not present

## 2016-12-11 DIAGNOSIS — Z888 Allergy status to other drugs, medicaments and biological substances status: Secondary | ICD-10-CM | POA: Diagnosis not present

## 2016-12-11 DIAGNOSIS — Z886 Allergy status to analgesic agent status: Secondary | ICD-10-CM | POA: Diagnosis not present

## 2016-12-17 DIAGNOSIS — M545 Low back pain: Secondary | ICD-10-CM | POA: Diagnosis not present

## 2016-12-22 DIAGNOSIS — M545 Low back pain: Secondary | ICD-10-CM | POA: Diagnosis not present

## 2016-12-24 DIAGNOSIS — M545 Low back pain: Secondary | ICD-10-CM | POA: Diagnosis not present

## 2016-12-30 DIAGNOSIS — M545 Low back pain: Secondary | ICD-10-CM | POA: Diagnosis not present

## 2017-01-01 DIAGNOSIS — M545 Low back pain: Secondary | ICD-10-CM | POA: Diagnosis not present

## 2017-01-06 DIAGNOSIS — I480 Paroxysmal atrial fibrillation: Secondary | ICD-10-CM | POA: Diagnosis not present

## 2017-01-06 DIAGNOSIS — M810 Age-related osteoporosis without current pathological fracture: Secondary | ICD-10-CM | POA: Diagnosis not present

## 2017-01-06 DIAGNOSIS — F411 Generalized anxiety disorder: Secondary | ICD-10-CM | POA: Diagnosis not present

## 2017-01-06 DIAGNOSIS — G609 Hereditary and idiopathic neuropathy, unspecified: Secondary | ICD-10-CM | POA: Diagnosis not present

## 2017-01-06 DIAGNOSIS — Z87442 Personal history of urinary calculi: Secondary | ICD-10-CM | POA: Diagnosis not present

## 2017-01-06 DIAGNOSIS — Z981 Arthrodesis status: Secondary | ICD-10-CM | POA: Diagnosis not present

## 2017-01-06 DIAGNOSIS — K219 Gastro-esophageal reflux disease without esophagitis: Secondary | ICD-10-CM | POA: Diagnosis not present

## 2017-01-08 DIAGNOSIS — M8008XD Age-related osteoporosis with current pathological fracture, vertebra(e), subsequent encounter for fracture with routine healing: Secondary | ICD-10-CM | POA: Diagnosis not present

## 2017-01-08 DIAGNOSIS — M8580 Other specified disorders of bone density and structure, unspecified site: Secondary | ICD-10-CM | POA: Diagnosis not present

## 2017-01-08 DIAGNOSIS — T84216A Breakdown (mechanical) of internal fixation device of vertebrae, initial encounter: Secondary | ICD-10-CM | POA: Diagnosis not present

## 2017-01-08 DIAGNOSIS — Z981 Arthrodesis status: Secondary | ICD-10-CM | POA: Diagnosis not present

## 2017-01-08 DIAGNOSIS — Z4789 Encounter for other orthopedic aftercare: Secondary | ICD-10-CM | POA: Diagnosis not present

## 2017-01-08 DIAGNOSIS — M8088XD Other osteoporosis with current pathological fracture, vertebra(e), subsequent encounter for fracture with routine healing: Secondary | ICD-10-CM | POA: Diagnosis not present

## 2017-01-08 DIAGNOSIS — M419 Scoliosis, unspecified: Secondary | ICD-10-CM | POA: Diagnosis not present

## 2017-01-13 DIAGNOSIS — I480 Paroxysmal atrial fibrillation: Secondary | ICD-10-CM | POA: Diagnosis not present

## 2017-01-13 DIAGNOSIS — F410 Panic disorder [episodic paroxysmal anxiety] without agoraphobia: Secondary | ICD-10-CM | POA: Diagnosis not present

## 2017-01-13 DIAGNOSIS — R002 Palpitations: Secondary | ICD-10-CM | POA: Diagnosis not present

## 2017-01-13 DIAGNOSIS — I348 Other nonrheumatic mitral valve disorders: Secondary | ICD-10-CM | POA: Diagnosis not present

## 2017-01-13 DIAGNOSIS — Z7901 Long term (current) use of anticoagulants: Secondary | ICD-10-CM | POA: Diagnosis not present

## 2017-01-22 DIAGNOSIS — M542 Cervicalgia: Secondary | ICD-10-CM | POA: Diagnosis not present

## 2017-01-22 DIAGNOSIS — Z9889 Other specified postprocedural states: Secondary | ICD-10-CM | POA: Diagnosis not present

## 2017-01-22 DIAGNOSIS — M25511 Pain in right shoulder: Secondary | ICD-10-CM | POA: Diagnosis not present

## 2017-01-22 DIAGNOSIS — M8008XD Age-related osteoporosis with current pathological fracture, vertebra(e), subsequent encounter for fracture with routine healing: Secondary | ICD-10-CM | POA: Diagnosis not present

## 2017-01-22 DIAGNOSIS — Z87310 Personal history of (healed) osteoporosis fracture: Secondary | ICD-10-CM | POA: Diagnosis not present

## 2017-01-22 DIAGNOSIS — M4153 Other secondary scoliosis, cervicothoracic region: Secondary | ICD-10-CM | POA: Diagnosis not present

## 2017-01-22 DIAGNOSIS — Z981 Arthrodesis status: Secondary | ICD-10-CM | POA: Diagnosis not present

## 2017-01-22 DIAGNOSIS — Z4789 Encounter for other orthopedic aftercare: Secondary | ICD-10-CM | POA: Diagnosis not present

## 2017-01-22 DIAGNOSIS — M25512 Pain in left shoulder: Secondary | ICD-10-CM | POA: Diagnosis not present

## 2017-01-29 DIAGNOSIS — Z981 Arthrodesis status: Secondary | ICD-10-CM | POA: Diagnosis not present

## 2017-01-29 DIAGNOSIS — M438X9 Other specified deforming dorsopathies, site unspecified: Secondary | ICD-10-CM | POA: Diagnosis not present

## 2017-01-29 DIAGNOSIS — Z13828 Encounter for screening for other musculoskeletal disorder: Secondary | ICD-10-CM | POA: Diagnosis not present

## 2017-02-04 DIAGNOSIS — M961 Postlaminectomy syndrome, not elsewhere classified: Secondary | ICD-10-CM | POA: Diagnosis not present

## 2017-02-04 DIAGNOSIS — Z79891 Long term (current) use of opiate analgesic: Secondary | ICD-10-CM | POA: Diagnosis not present

## 2017-02-04 DIAGNOSIS — M4693 Unspecified inflammatory spondylopathy, cervicothoracic region: Secondary | ICD-10-CM | POA: Diagnosis not present

## 2017-02-04 DIAGNOSIS — G894 Chronic pain syndrome: Secondary | ICD-10-CM | POA: Diagnosis not present

## 2017-02-05 ENCOUNTER — Telehealth: Payer: Self-pay | Admitting: *Deleted

## 2017-02-05 DIAGNOSIS — H2513 Age-related nuclear cataract, bilateral: Secondary | ICD-10-CM | POA: Diagnosis not present

## 2017-02-05 DIAGNOSIS — H35372 Puckering of macula, left eye: Secondary | ICD-10-CM | POA: Diagnosis not present

## 2017-02-05 DIAGNOSIS — H43813 Vitreous degeneration, bilateral: Secondary | ICD-10-CM | POA: Diagnosis not present

## 2017-02-05 NOTE — Telephone Encounter (Signed)
Pt informed

## 2017-02-05 NOTE — Telephone Encounter (Signed)
Pt called c/o breast discomfort states she right/left both have slight discomfort, I advised pt OV for beast exam, but pt said she can't come in for visit, due to post surgery and finding someone to bring her, she can't drive herself due lumbar surgery, pt very tearful on phone with her health and with your retirement. Please advise

## 2017-02-05 NOTE — Telephone Encounter (Signed)
Tell her that I looked at her mammogram from October 2017 and it was normal. I would recommend taking vitamin E6 100 units daily also cut down on her caffeine-containing products and apply moist warm towel compresses to alleviate discomfort until she can come to the office to be seen

## 2017-02-06 ENCOUNTER — Ambulatory Visit (INDEPENDENT_AMBULATORY_CARE_PROVIDER_SITE_OTHER): Payer: Medicare Other | Admitting: Gynecology

## 2017-02-06 ENCOUNTER — Encounter: Payer: Self-pay | Admitting: Gynecology

## 2017-02-06 VITALS — BP 98/62 | Wt 121.2 lb

## 2017-02-06 DIAGNOSIS — N644 Mastodynia: Secondary | ICD-10-CM

## 2017-02-06 NOTE — Patient Instructions (Signed)

## 2017-02-06 NOTE — Progress Notes (Signed)
  71 year old patient presented to the office today complaining of bilateral breast tenderness. She denied any recent trauma. Her right breast is more tender than her left. Patient on no hormone replacement therapy. Patient has not palpated any masses. Patient's previous mammograms been normal. Patient with no first line relative with breast cancer. Her last mammogram was less than a year ago.  Exam: Both breasts were examined sitting supine position both breasts were symmetrical in appearance no skin discoloration or nipple inversion no palpable masses or tenderness and no supraclavicular lymphadenopathy.  Assessment/plan: Patient is scheduled for diagnostic mammogram of both breasts because of the bilateral breast tenderness in the upper outer quadrant of both breasts although no masses were palpated. She was instructed to refrain from caffeine-containing products and reminded she is an annual GYN exam sometime this year.

## 2017-02-12 ENCOUNTER — Encounter: Payer: Self-pay | Admitting: Gynecology

## 2017-02-12 DIAGNOSIS — N644 Mastodynia: Secondary | ICD-10-CM | POA: Diagnosis not present

## 2017-02-18 ENCOUNTER — Encounter: Payer: Self-pay | Admitting: Family Medicine

## 2017-02-18 ENCOUNTER — Ambulatory Visit (INDEPENDENT_AMBULATORY_CARE_PROVIDER_SITE_OTHER): Payer: Medicare Other | Admitting: Family Medicine

## 2017-02-18 VITALS — BP 124/72 | HR 80 | Temp 97.8°F | Resp 16 | Ht 66.0 in | Wt 121.0 lb

## 2017-02-18 DIAGNOSIS — E440 Moderate protein-calorie malnutrition: Secondary | ICD-10-CM | POA: Diagnosis not present

## 2017-02-18 DIAGNOSIS — R5383 Other fatigue: Secondary | ICD-10-CM | POA: Insufficient documentation

## 2017-02-18 DIAGNOSIS — F4323 Adjustment disorder with mixed anxiety and depressed mood: Secondary | ICD-10-CM | POA: Diagnosis not present

## 2017-02-18 DIAGNOSIS — I480 Paroxysmal atrial fibrillation: Secondary | ICD-10-CM | POA: Diagnosis not present

## 2017-02-18 DIAGNOSIS — E538 Deficiency of other specified B group vitamins: Secondary | ICD-10-CM

## 2017-02-18 DIAGNOSIS — H6123 Impacted cerumen, bilateral: Secondary | ICD-10-CM

## 2017-02-18 DIAGNOSIS — Z79891 Long term (current) use of opiate analgesic: Secondary | ICD-10-CM | POA: Diagnosis not present

## 2017-02-18 LAB — BASIC METABOLIC PANEL
BUN: 17 mg/dL (ref 6–23)
CO2: 29 mEq/L (ref 19–32)
Calcium: 8.9 mg/dL (ref 8.4–10.5)
Chloride: 105 mEq/L (ref 96–112)
Creatinine, Ser: 0.6 mg/dL (ref 0.40–1.20)
GFR: 104.28 mL/min (ref >60.00)
Glucose, Bld: 96 mg/dL (ref 70–99)
Potassium: 4.2 mEq/L (ref 3.5–5.1)
Sodium: 140 mEq/L (ref 135–145)

## 2017-02-18 LAB — CBC WITH DIFFERENTIAL/PLATELET
Basophils Absolute: 0 10*3/uL (ref 0.0–0.1)
Basophils Relative: 0.6 % (ref 0.0–3.0)
Eosinophils Absolute: 0.2 10*3/uL (ref 0.0–0.7)
Eosinophils Relative: 3.7 % (ref 0.0–5.0)
HCT: 39.1 % (ref 36.0–46.0)
Hemoglobin: 13 g/dL (ref 12.0–15.0)
Lymphocytes Relative: 21.6 % (ref 12.0–46.0)
Lymphs Abs: 1.1 10*3/uL (ref 0.7–4.0)
MCHC: 33.2 g/dL (ref 30.0–36.0)
MCV: 88.9 fl (ref 78.0–100.0)
Monocytes Absolute: 0.4 10*3/uL (ref 0.1–1.0)
Monocytes Relative: 7.4 % (ref 3.0–12.0)
Neutro Abs: 3.3 10*3/uL (ref 1.4–7.7)
Neutrophils Relative %: 66.7 % (ref 43.0–77.0)
Platelets: 193 10*3/uL (ref 150.0–400.0)
RBC: 4.4 Mil/uL (ref 3.87–5.11)
RDW: 15.8 % — ABNORMAL HIGH (ref 11.5–15.5)
WBC: 5 10*3/uL (ref 4.0–10.5)

## 2017-02-18 LAB — B12 AND FOLATE PANEL
Folate: 20.7 ng/mL (ref >5.9)
Vitamin B-12: 218 pg/mL (ref 211–911)

## 2017-02-18 LAB — HEPATIC FUNCTION PANEL
ALT: 9 U/L (ref 0–35)
AST: 16 U/L (ref 0–37)
Albumin: 4.4 g/dL (ref 3.5–5.2)
Alkaline Phosphatase: 89 U/L (ref 39–117)
Bilirubin, Direct: 0.1 mg/dL (ref 0.0–0.3)
Total Bilirubin: 0.7 mg/dL (ref 0.2–1.2)
Total Protein: 6.2 g/dL (ref 6.0–8.3)

## 2017-02-18 LAB — TSH: TSH: 1.97 u[IU]/mL (ref 0.35–4.50)

## 2017-02-18 NOTE — Progress Notes (Signed)
Pre visit review using our clinic review tool, if applicable. No additional management support is needed unless otherwise documented below in the visit note. 

## 2017-02-18 NOTE — Patient Instructions (Signed)
Follow up as needed We'll notify you of your lab results and make any changes if needed Please make sure you are taking time for you- you need to be in a good state of mind going into your next surgery Please consider counseling to help you get through this Call with any questions or concerns Hang in there and GOOD LUCK!!!

## 2017-02-18 NOTE — Progress Notes (Signed)
   Subjective:    Patient ID: [name], female    DOB: March 27, 1946, 71 y.o.   MRN: [id number]  HPI 'i'm not really good'- pt is 'headed for my 2nd surgery' this year.  They are planning to replace hardware from neck to pelvis.  Pt had 2 rods in lower back break in January.  She had surgery in March to correct this issue.  Pt is now only able to walk short distances and requires a walker to do so.  Pt reports 'feeling real fatigued'.  Recently switched from liquid oxycodone to tablet formation and has noted increased side effects- some lightheadedness, nausea.  Family is going to help financially to get back on liquid meds.  She is having a lot of family stressors- son's girlfriend has moved in which makes pt very uncomfortable.  Bilateral hearing loss due to cerumen impaction- pt wants wax removed   Review of Systems For ROS see HPI     Objective:   Physical Exam  Constitutional: She is oriented to person, place, and time.  Frail elderly woman  HENT:  Head: Normocephalic and atraumatic.  Bilateral cerumen impaction w/ dry wax.  Removed w/ curette.  TMs WNL.  L ear bleeding at opening of EAC due to adhered wax but canal and TM WNL  Neck: No thyromegaly present.  Cardiovascular: Normal rate, regular rhythm and normal heart sounds.   Pulmonary/Chest: [place] normal and breath sounds normal. No respiratory distress. She has no wheezes. She has no rales.  Lymphadenopathy:    She has no cervical adenopathy.  Neurological: She is alert and oriented to person, place, and time.  Skin: Skin is warm and dry.  Psychiatric: Her behavior is normal.  Tearful, anxious, rapid speech, tangential thought process  Vitals reviewed.         Assessment & Plan:

## 2017-02-19 ENCOUNTER — Telehealth: Payer: Self-pay | Admitting: Family Medicine

## 2017-02-19 ENCOUNTER — Encounter: Payer: Self-pay | Admitting: General Practice

## 2017-02-19 NOTE — Telephone Encounter (Signed)
Spoke with patient, reports when she puts kleenex in her ear there continues to be blood but not a large amt. Experienced ear pain this morning, but has improved. Denies hearing loss. Advised to not place anything in her ear and call for an appointment in the morning if bleeding has not resolved. Patient verbalized understanding.

## 2017-02-19 NOTE — Telephone Encounter (Signed)
[place] Patient Name: [name] DOB: 05/10/1946 Initial Comment Caller states, she had ear left ear bleeding from wax cleaning- blood on pillow case. On blood thinner- not heavy bleeding. Nurse Assessment Nurse: Markus Daft, RN, Sherre Poot Date/Time (Eastern Time): 02/19/2017 8:28:26 AM Confirm and document reason for call. If symptomatic, describe symptoms. ---Caller states that she had ear left ear bleeding from wax cleaning at MD office yesterday afternoon. It was bleeding before she left the office. The AM there was blood on pillow case. On blood thinner - bright red blood, and not heavy bleeding. Does the patient have any new or worsening symptoms? ---Yes Will a triage be completed? ---Yes Related visit to physician within the last 2 weeks? ---Yes Does the PT have any chronic conditions? (i.e. diabetes, asthma, etc.) ---Yes List chronic conditions. ---On Xarelto - Atrial fibrillation, chronic back problems - 6 back surgeries Is this a behavioral health or substance abuse call? ---No Guidelines Guideline Title Affirmed Question Affirmed Notes Ear Injury [1] Few drops of blood AND [2] follows ear exam at [place] office Final Disposition User [place], RN, [place] Comments RN explained that she may have a little more bleeding d/t being on blood thinner. Caller was nervous to try Olive oil once. RN discouraged using a Qtip to see how much blood. MD call patient or do you want to see her back today? Please call. Disagree/Comply: Comply

## 2017-02-19 NOTE — Telephone Encounter (Signed)
Reviewed Note. Discussed with PCP -- Dr. [name].  No need for appointment today if no other symptoms present.  Bleeding should stop soon. Do not place any other objects deep into the ear. Can place cotton ball gently into the ear to catch any further blood/drainage. If not resolving today, have her come see [place] in office.

## 2017-02-19 NOTE — Telephone Encounter (Signed)
Ear wax was very dry when removed yesterday and there was some irritation and bleeding at the opening of the ear canal.  This will continue to ooze due to her blood thinners but there is no cause for concern- the ear drum and canal looked great yesterday.

## 2017-02-19 NOTE — Telephone Encounter (Signed)
Patient returning [name]'s call.  Requesting a call back from [name] when she is available.  Tel # [phone].

## 2017-02-19 NOTE — Telephone Encounter (Signed)
LM advising patient bleeding should stop and not place anything into the ear. Advised to use cotton ball and call if bleeding has not resolved. Requested call back.

## 2017-02-20 ENCOUNTER — Telehealth: Payer: Self-pay | Admitting: Family Medicine

## 2017-02-20 NOTE — Assessment & Plan Note (Signed)
Recurrent problem for pt.  Suspect this episode is due to her ongoing anxiety and depression but will check labs to r/o metabolic cause.  Address any abnormality if present.  Pt expressed understanding and is in agreement w/ plan.

## 2017-02-20 NOTE — Assessment & Plan Note (Signed)
New.  Able to successfully remove wax bilaterally w/ curette.  Some oozing of blood at opening to EAC due to dry, adherent wax.  Explained this and appropriate tx to pt.  Pt expressed understanding and is in agreement w/ plan.  Hearing improved immediately.

## 2017-02-20 NOTE — Assessment & Plan Note (Signed)
Chronic problem.  Rate controlled and on anticoagulation.  Following w/ Dr Wynonia Lawman.

## 2017-02-20 NOTE — Telephone Encounter (Signed)
Pt would like a call back regarding the bleeding that is still coming from her ears.

## 2017-02-20 NOTE — Assessment & Plan Note (Signed)
Ongoing issue for pt.  Again stressed need for adequate calorie and protein intake in order for her to heal after surgery.  Will follow.

## 2017-02-20 NOTE — Telephone Encounter (Signed)
Pt advised per PCP recommendations. to use a damp cotton ball to remove the caked blood from her ear. Pt advised that the bleeding should eventually stop. Pt stated an understanding.

## 2017-02-20 NOTE — Assessment & Plan Note (Signed)
Check labs to determine if this is cause of her fatigue but I suspect this is due to her anxiety and depression.  Replete prn.

## 2017-02-20 NOTE — Assessment & Plan Note (Signed)
Deteriorated due to issues w/ her son and him moving his girlfriend into the house they share.  'I don't believe in it'.  She refuses medication for anxiety and depression so strongly recommended she pursue counseling.  Pt to consider this.

## 2017-03-02 DIAGNOSIS — F419 Anxiety disorder, unspecified: Secondary | ICD-10-CM | POA: Diagnosis not present

## 2017-03-02 DIAGNOSIS — M960 Pseudarthrosis after fusion or arthrodesis: Secondary | ICD-10-CM | POA: Diagnosis not present

## 2017-03-02 DIAGNOSIS — Z87891 Personal history of nicotine dependence: Secondary | ICD-10-CM | POA: Diagnosis not present

## 2017-03-02 DIAGNOSIS — K219 Gastro-esophageal reflux disease without esophagitis: Secondary | ICD-10-CM | POA: Diagnosis not present

## 2017-03-02 DIAGNOSIS — Z7901 Long term (current) use of anticoagulants: Secondary | ICD-10-CM | POA: Diagnosis not present

## 2017-03-02 DIAGNOSIS — T84216A Breakdown (mechanical) of internal fixation device of vertebrae, initial encounter: Secondary | ICD-10-CM | POA: Diagnosis not present

## 2017-03-02 DIAGNOSIS — M40203 Unspecified kyphosis, cervicothoracic region: Secondary | ICD-10-CM | POA: Diagnosis not present

## 2017-03-02 DIAGNOSIS — M8008XS Age-related osteoporosis with current pathological fracture, vertebra(e), sequela: Secondary | ICD-10-CM | POA: Diagnosis not present

## 2017-03-02 DIAGNOSIS — Z472 Encounter for removal of internal fixation device: Secondary | ICD-10-CM | POA: Diagnosis not present

## 2017-03-02 DIAGNOSIS — G8929 Other chronic pain: Secondary | ICD-10-CM | POA: Diagnosis not present

## 2017-03-02 DIAGNOSIS — I081 Rheumatic disorders of both mitral and tricuspid valves: Secondary | ICD-10-CM | POA: Diagnosis not present

## 2017-03-02 DIAGNOSIS — I4891 Unspecified atrial fibrillation: Secondary | ICD-10-CM | POA: Diagnosis not present

## 2017-03-18 DIAGNOSIS — M960 Pseudarthrosis after fusion or arthrodesis: Secondary | ICD-10-CM | POA: Diagnosis present

## 2017-03-18 DIAGNOSIS — Z960 Presence of urogenital implants: Secondary | ICD-10-CM | POA: Diagnosis not present

## 2017-03-18 DIAGNOSIS — Z472 Encounter for removal of internal fixation device: Secondary | ICD-10-CM | POA: Diagnosis not present

## 2017-03-18 DIAGNOSIS — M810 Age-related osteoporosis without current pathological fracture: Secondary | ICD-10-CM | POA: Diagnosis not present

## 2017-03-18 DIAGNOSIS — M419 Scoliosis, unspecified: Secondary | ICD-10-CM | POA: Diagnosis not present

## 2017-03-18 DIAGNOSIS — B009 Herpesviral infection, unspecified: Secondary | ICD-10-CM | POA: Diagnosis not present

## 2017-03-18 DIAGNOSIS — T84216A Breakdown (mechanical) of internal fixation device of vertebrae, initial encounter: Secondary | ICD-10-CM | POA: Diagnosis not present

## 2017-03-18 DIAGNOSIS — I081 Rheumatic disorders of both mitral and tricuspid valves: Secondary | ICD-10-CM | POA: Diagnosis not present

## 2017-03-18 DIAGNOSIS — F419 Anxiety disorder, unspecified: Secondary | ICD-10-CM | POA: Diagnosis not present

## 2017-03-18 DIAGNOSIS — M963 Postlaminectomy kyphosis: Secondary | ICD-10-CM | POA: Diagnosis not present

## 2017-03-18 DIAGNOSIS — M4312 Spondylolisthesis, cervical region: Secondary | ICD-10-CM | POA: Diagnosis not present

## 2017-03-18 DIAGNOSIS — M8580 Other specified disorders of bone density and structure, unspecified site: Secondary | ICD-10-CM | POA: Diagnosis not present

## 2017-03-18 DIAGNOSIS — I4891 Unspecified atrial fibrillation: Secondary | ICD-10-CM | POA: Diagnosis not present

## 2017-03-18 DIAGNOSIS — D696 Thrombocytopenia, unspecified: Secondary | ICD-10-CM | POA: Diagnosis not present

## 2017-03-18 DIAGNOSIS — Z4782 Encounter for orthopedic aftercare following scoliosis surgery: Secondary | ICD-10-CM | POA: Diagnosis not present

## 2017-03-18 DIAGNOSIS — G9529 Other cord compression: Secondary | ICD-10-CM | POA: Diagnosis not present

## 2017-03-18 DIAGNOSIS — Z87891 Personal history of nicotine dependence: Secondary | ICD-10-CM | POA: Diagnosis not present

## 2017-03-18 DIAGNOSIS — Z981 Arthrodesis status: Secondary | ICD-10-CM | POA: Diagnosis not present

## 2017-03-18 DIAGNOSIS — G8929 Other chronic pain: Secondary | ICD-10-CM | POA: Diagnosis not present

## 2017-03-18 DIAGNOSIS — Z7901 Long term (current) use of anticoagulants: Secondary | ICD-10-CM | POA: Diagnosis not present

## 2017-03-18 DIAGNOSIS — M8008XS Age-related osteoporosis with current pathological fracture, vertebra(e), sequela: Secondary | ICD-10-CM | POA: Diagnosis present

## 2017-03-18 DIAGNOSIS — K219 Gastro-esophageal reflux disease without esophagitis: Secondary | ICD-10-CM | POA: Diagnosis not present

## 2017-03-18 DIAGNOSIS — N390 Urinary tract infection, site not specified: Secondary | ICD-10-CM | POA: Diagnosis not present

## 2017-03-18 DIAGNOSIS — M40203 Unspecified kyphosis, cervicothoracic region: Secondary | ICD-10-CM | POA: Diagnosis present

## 2017-03-18 DIAGNOSIS — D649 Anemia, unspecified: Secondary | ICD-10-CM | POA: Diagnosis not present

## 2017-03-25 ENCOUNTER — Telehealth: Payer: Self-pay | Admitting: Family Medicine

## 2017-03-25 DIAGNOSIS — G8918 Other acute postprocedural pain: Secondary | ICD-10-CM | POA: Diagnosis not present

## 2017-03-25 DIAGNOSIS — Z7901 Long term (current) use of anticoagulants: Secondary | ICD-10-CM | POA: Diagnosis not present

## 2017-03-25 DIAGNOSIS — R52 Pain, unspecified: Secondary | ICD-10-CM | POA: Diagnosis not present

## 2017-03-25 DIAGNOSIS — M4180 Other forms of scoliosis, site unspecified: Secondary | ICD-10-CM | POA: Diagnosis not present

## 2017-03-25 DIAGNOSIS — G8929 Other chronic pain: Secondary | ICD-10-CM | POA: Diagnosis not present

## 2017-03-25 DIAGNOSIS — M545 Low back pain: Secondary | ICD-10-CM | POA: Diagnosis not present

## 2017-03-25 DIAGNOSIS — M5489 Other dorsalgia: Secondary | ICD-10-CM | POA: Diagnosis not present

## 2017-03-25 DIAGNOSIS — I4891 Unspecified atrial fibrillation: Secondary | ICD-10-CM | POA: Diagnosis not present

## 2017-03-25 DIAGNOSIS — Z7409 Other reduced mobility: Secondary | ICD-10-CM | POA: Diagnosis not present

## 2017-03-25 DIAGNOSIS — Z609 Problem related to social environment, unspecified: Secondary | ICD-10-CM | POA: Diagnosis not present

## 2017-03-25 DIAGNOSIS — R3915 Urgency of urination: Secondary | ICD-10-CM | POA: Diagnosis not present

## 2017-03-25 NOTE — Telephone Encounter (Signed)
Sister is with pt at [facility], sister states that pt has had back surgery and is needing a referral for Rehab for when she is discharged. Sister would like referral sent to [facility] [address] [place], [place] [address]. Sister would like a call back when this referral has been sent. I advise her that a follow up appt with KT may be needed first.

## 2017-03-25 NOTE — Telephone Encounter (Signed)
Rehab referral would need to come from operating surgeon

## 2017-03-25 NOTE — Telephone Encounter (Signed)
I am NOT able to refer for rehab placement.  I would have the family ask the ER to consult a social worker to help them with placement as this is not something I am able to do

## 2017-03-25 NOTE — Telephone Encounter (Signed)
Spoke with pt sister. Pt was discharged from the hospital from her surgery. Pt was in such severe pain she went to the ER today. Sister is advising that the ER will not release her without this referral, however since the surgeon discharged her to go home he will not write this referral.   I advised pt sister of this. She wanted to know if she could just bring sister here for an appt for pain follow up and then have T[facility]i write referral?

## 2017-03-25 NOTE — Telephone Encounter (Signed)
Patient's sister called back wanting to know if we could do this.  Sister states that they are currently in the ER, and they will not let her go home, but told her she needed a referral from PCP for facility listed below.  Sister is anxious to get this going as patient cannot be sent home.

## 2017-03-25 NOTE — Telephone Encounter (Signed)
Called to inform pt sister again. No answer on phone.

## 2017-03-26 DIAGNOSIS — R488 Other symbolic dysfunctions: Secondary | ICD-10-CM | POA: Diagnosis not present

## 2017-03-26 DIAGNOSIS — M549 Dorsalgia, unspecified: Secondary | ICD-10-CM | POA: Diagnosis not present

## 2017-03-26 DIAGNOSIS — R339 Retention of urine, unspecified: Secondary | ICD-10-CM | POA: Diagnosis not present

## 2017-03-26 DIAGNOSIS — R040 Epistaxis: Secondary | ICD-10-CM | POA: Diagnosis not present

## 2017-03-26 DIAGNOSIS — G8929 Other chronic pain: Secondary | ICD-10-CM | POA: Diagnosis not present

## 2017-03-26 DIAGNOSIS — M545 Low back pain: Secondary | ICD-10-CM | POA: Diagnosis not present

## 2017-03-26 DIAGNOSIS — M6281 Muscle weakness (generalized): Secondary | ICD-10-CM | POA: Diagnosis not present

## 2017-03-26 DIAGNOSIS — Z5189 Encounter for other specified aftercare: Secondary | ICD-10-CM | POA: Diagnosis not present

## 2017-03-26 DIAGNOSIS — M5080 Other cervical disc disorders, unspecified cervical region: Secondary | ICD-10-CM | POA: Diagnosis not present

## 2017-03-26 DIAGNOSIS — Z7901 Long term (current) use of anticoagulants: Secondary | ICD-10-CM | POA: Diagnosis not present

## 2017-03-26 DIAGNOSIS — Z4789 Encounter for other orthopedic aftercare: Secondary | ICD-10-CM | POA: Diagnosis not present

## 2017-03-26 DIAGNOSIS — R52 Pain, unspecified: Secondary | ICD-10-CM | POA: Diagnosis not present

## 2017-03-26 DIAGNOSIS — R2689 Other abnormalities of gait and mobility: Secondary | ICD-10-CM | POA: Diagnosis not present

## 2017-03-26 DIAGNOSIS — G8918 Other acute postprocedural pain: Secondary | ICD-10-CM | POA: Diagnosis not present

## 2017-03-26 DIAGNOSIS — M4180 Other forms of scoliosis, site unspecified: Secondary | ICD-10-CM | POA: Diagnosis not present

## 2017-03-26 DIAGNOSIS — R609 Edema, unspecified: Secondary | ICD-10-CM | POA: Diagnosis not present

## 2017-03-26 DIAGNOSIS — R8290 Unspecified abnormal findings in urine: Secondary | ICD-10-CM | POA: Diagnosis not present

## 2017-03-26 DIAGNOSIS — I4891 Unspecified atrial fibrillation: Secondary | ICD-10-CM | POA: Diagnosis not present

## 2017-03-26 DIAGNOSIS — G609 Hereditary and idiopathic neuropathy, unspecified: Secondary | ICD-10-CM | POA: Diagnosis not present

## 2017-03-26 DIAGNOSIS — R5381 Other malaise: Secondary | ICD-10-CM | POA: Diagnosis not present

## 2017-03-27 DIAGNOSIS — R040 Epistaxis: Secondary | ICD-10-CM | POA: Diagnosis not present

## 2017-03-30 DIAGNOSIS — R2689 Other abnormalities of gait and mobility: Secondary | ICD-10-CM | POA: Diagnosis not present

## 2017-03-31 DIAGNOSIS — R8290 Unspecified abnormal findings in urine: Secondary | ICD-10-CM | POA: Diagnosis not present

## 2017-03-31 DIAGNOSIS — R609 Edema, unspecified: Secondary | ICD-10-CM | POA: Diagnosis not present

## 2017-03-31 DIAGNOSIS — R339 Retention of urine, unspecified: Secondary | ICD-10-CM | POA: Diagnosis not present

## 2017-04-08 DIAGNOSIS — R079 Chest pain, unspecified: Secondary | ICD-10-CM | POA: Diagnosis not present

## 2017-04-08 DIAGNOSIS — R600 Localized edema: Secondary | ICD-10-CM | POA: Diagnosis not present

## 2017-04-08 DIAGNOSIS — M7989 Other specified soft tissue disorders: Secondary | ICD-10-CM | POA: Diagnosis not present

## 2017-04-08 DIAGNOSIS — Z79899 Other long term (current) drug therapy: Secondary | ICD-10-CM | POA: Diagnosis not present

## 2017-04-08 DIAGNOSIS — Z91040 Latex allergy status: Secondary | ICD-10-CM | POA: Diagnosis not present

## 2017-04-08 DIAGNOSIS — N390 Urinary tract infection, site not specified: Secondary | ICD-10-CM | POA: Diagnosis not present

## 2017-04-08 DIAGNOSIS — Z7901 Long term (current) use of anticoagulants: Secondary | ICD-10-CM | POA: Diagnosis not present

## 2017-04-08 DIAGNOSIS — Z881 Allergy status to other antibiotic agents status: Secondary | ICD-10-CM | POA: Diagnosis not present

## 2017-04-08 DIAGNOSIS — M542 Cervicalgia: Secondary | ICD-10-CM | POA: Diagnosis not present

## 2017-04-08 DIAGNOSIS — R791 Abnormal coagulation profile: Secondary | ICD-10-CM | POA: Diagnosis not present

## 2017-04-08 DIAGNOSIS — I480 Paroxysmal atrial fibrillation: Secondary | ICD-10-CM | POA: Diagnosis not present

## 2017-04-08 DIAGNOSIS — I340 Nonrheumatic mitral (valve) insufficiency: Secondary | ICD-10-CM | POA: Diagnosis not present

## 2017-04-08 DIAGNOSIS — Z882 Allergy status to sulfonamides status: Secondary | ICD-10-CM | POA: Diagnosis not present

## 2017-04-08 DIAGNOSIS — Z885 Allergy status to narcotic agent status: Secondary | ICD-10-CM | POA: Diagnosis not present

## 2017-04-08 DIAGNOSIS — R410 Disorientation, unspecified: Secondary | ICD-10-CM | POA: Diagnosis not present

## 2017-04-08 DIAGNOSIS — Z91048 Other nonmedicinal substance allergy status: Secondary | ICD-10-CM | POA: Diagnosis not present

## 2017-04-08 DIAGNOSIS — E785 Hyperlipidemia, unspecified: Secondary | ICD-10-CM | POA: Diagnosis not present

## 2017-04-08 DIAGNOSIS — Z9889 Other specified postprocedural states: Secondary | ICD-10-CM | POA: Diagnosis not present

## 2017-04-08 DIAGNOSIS — F411 Generalized anxiety disorder: Secondary | ICD-10-CM | POA: Diagnosis not present

## 2017-04-08 DIAGNOSIS — Z7982 Long term (current) use of aspirin: Secondary | ICD-10-CM | POA: Diagnosis not present

## 2017-04-08 DIAGNOSIS — R0789 Other chest pain: Secondary | ICD-10-CM | POA: Diagnosis not present

## 2017-04-08 DIAGNOSIS — M549 Dorsalgia, unspecified: Secondary | ICD-10-CM | POA: Diagnosis not present

## 2017-04-08 DIAGNOSIS — Z87891 Personal history of nicotine dependence: Secondary | ICD-10-CM | POA: Diagnosis not present

## 2017-04-08 DIAGNOSIS — M4120 Other idiopathic scoliosis, site unspecified: Secondary | ICD-10-CM | POA: Diagnosis not present

## 2017-04-08 DIAGNOSIS — Z91041 Radiographic dye allergy status: Secondary | ICD-10-CM | POA: Diagnosis not present

## 2017-04-08 DIAGNOSIS — F339 Major depressive disorder, recurrent, unspecified: Secondary | ICD-10-CM | POA: Diagnosis not present

## 2017-04-09 DIAGNOSIS — I081 Rheumatic disorders of both mitral and tricuspid valves: Secondary | ICD-10-CM | POA: Diagnosis not present

## 2017-04-09 DIAGNOSIS — F339 Major depressive disorder, recurrent, unspecified: Secondary | ICD-10-CM | POA: Diagnosis not present

## 2017-04-09 DIAGNOSIS — M4120 Other idiopathic scoliosis, site unspecified: Secondary | ICD-10-CM | POA: Diagnosis not present

## 2017-04-09 DIAGNOSIS — R791 Abnormal coagulation profile: Secondary | ICD-10-CM | POA: Diagnosis not present

## 2017-04-09 DIAGNOSIS — Z7901 Long term (current) use of anticoagulants: Secondary | ICD-10-CM | POA: Diagnosis not present

## 2017-04-09 DIAGNOSIS — N390 Urinary tract infection, site not specified: Secondary | ICD-10-CM | POA: Diagnosis not present

## 2017-04-09 DIAGNOSIS — M7989 Other specified soft tissue disorders: Secondary | ICD-10-CM | POA: Diagnosis not present

## 2017-04-09 DIAGNOSIS — R410 Disorientation, unspecified: Secondary | ICD-10-CM | POA: Diagnosis not present

## 2017-04-09 DIAGNOSIS — I480 Paroxysmal atrial fibrillation: Secondary | ICD-10-CM | POA: Diagnosis not present

## 2017-04-09 DIAGNOSIS — R079 Chest pain, unspecified: Secondary | ICD-10-CM | POA: Diagnosis not present

## 2017-04-09 DIAGNOSIS — I340 Nonrheumatic mitral (valve) insufficiency: Secondary | ICD-10-CM | POA: Diagnosis not present

## 2017-04-09 DIAGNOSIS — R7989 Other specified abnormal findings of blood chemistry: Secondary | ICD-10-CM | POA: Diagnosis not present

## 2017-04-09 DIAGNOSIS — E785 Hyperlipidemia, unspecified: Secondary | ICD-10-CM | POA: Diagnosis not present

## 2017-04-09 DIAGNOSIS — R0789 Other chest pain: Secondary | ICD-10-CM | POA: Diagnosis not present

## 2017-04-09 DIAGNOSIS — F411 Generalized anxiety disorder: Secondary | ICD-10-CM | POA: Diagnosis not present

## 2017-04-10 DIAGNOSIS — E785 Hyperlipidemia, unspecified: Secondary | ICD-10-CM | POA: Diagnosis not present

## 2017-04-10 DIAGNOSIS — R7989 Other specified abnormal findings of blood chemistry: Secondary | ICD-10-CM | POA: Diagnosis not present

## 2017-04-10 DIAGNOSIS — R079 Chest pain, unspecified: Secondary | ICD-10-CM | POA: Diagnosis not present

## 2017-04-10 DIAGNOSIS — R410 Disorientation, unspecified: Secondary | ICD-10-CM | POA: Diagnosis not present

## 2017-04-10 DIAGNOSIS — I480 Paroxysmal atrial fibrillation: Secondary | ICD-10-CM | POA: Diagnosis not present

## 2017-04-10 DIAGNOSIS — M4120 Other idiopathic scoliosis, site unspecified: Secondary | ICD-10-CM | POA: Diagnosis not present

## 2017-04-10 DIAGNOSIS — I340 Nonrheumatic mitral (valve) insufficiency: Secondary | ICD-10-CM | POA: Diagnosis not present

## 2017-04-14 DIAGNOSIS — R339 Retention of urine, unspecified: Secondary | ICD-10-CM | POA: Diagnosis not present

## 2017-04-14 DIAGNOSIS — R8290 Unspecified abnormal findings in urine: Secondary | ICD-10-CM | POA: Diagnosis not present

## 2017-04-15 ENCOUNTER — Telehealth: Payer: Self-pay | Admitting: *Deleted

## 2017-04-15 DIAGNOSIS — G609 Hereditary and idiopathic neuropathy, unspecified: Secondary | ICD-10-CM | POA: Diagnosis not present

## 2017-04-15 DIAGNOSIS — T84296D Other mechanical complication of internal fixation device of vertebrae, subsequent encounter: Secondary | ICD-10-CM | POA: Diagnosis not present

## 2017-04-15 DIAGNOSIS — G8929 Other chronic pain: Secondary | ICD-10-CM | POA: Diagnosis not present

## 2017-04-15 DIAGNOSIS — I480 Paroxysmal atrial fibrillation: Secondary | ICD-10-CM | POA: Diagnosis not present

## 2017-04-15 DIAGNOSIS — M1990 Unspecified osteoarthritis, unspecified site: Secondary | ICD-10-CM | POA: Diagnosis not present

## 2017-04-15 DIAGNOSIS — M4120 Other idiopathic scoliosis, site unspecified: Secondary | ICD-10-CM | POA: Diagnosis not present

## 2017-04-15 NOTE — Telephone Encounter (Signed)
[name] from [facility] calling for PT, OT & Nursing orders. Nursing 1x a week for 8 weeks PT/OT 1x week for 1 week  Number to call back for verbal orders:  [phone]

## 2017-04-15 NOTE — Telephone Encounter (Signed)
Verbal ok given.

## 2017-04-16 DIAGNOSIS — Z7901 Long term (current) use of anticoagulants: Secondary | ICD-10-CM | POA: Diagnosis not present

## 2017-04-16 DIAGNOSIS — F410 Panic disorder [episodic paroxysmal anxiety] without agoraphobia: Secondary | ICD-10-CM | POA: Diagnosis not present

## 2017-04-16 DIAGNOSIS — I480 Paroxysmal atrial fibrillation: Secondary | ICD-10-CM | POA: Diagnosis not present

## 2017-04-16 DIAGNOSIS — R609 Edema, unspecified: Secondary | ICD-10-CM | POA: Diagnosis not present

## 2017-04-16 DIAGNOSIS — I348 Other nonrheumatic mitral valve disorders: Secondary | ICD-10-CM | POA: Diagnosis not present

## 2017-04-16 DIAGNOSIS — R0602 Shortness of breath: Secondary | ICD-10-CM | POA: Diagnosis not present

## 2017-04-20 ENCOUNTER — Other Ambulatory Visit: Payer: Self-pay | Admitting: Cardiology

## 2017-04-20 DIAGNOSIS — I480 Paroxysmal atrial fibrillation: Secondary | ICD-10-CM | POA: Diagnosis not present

## 2017-04-20 DIAGNOSIS — T84296D Other mechanical complication of internal fixation device of vertebrae, subsequent encounter: Secondary | ICD-10-CM | POA: Diagnosis not present

## 2017-04-20 DIAGNOSIS — R609 Edema, unspecified: Secondary | ICD-10-CM

## 2017-04-20 DIAGNOSIS — M1990 Unspecified osteoarthritis, unspecified site: Secondary | ICD-10-CM | POA: Diagnosis not present

## 2017-04-20 DIAGNOSIS — G8929 Other chronic pain: Secondary | ICD-10-CM | POA: Diagnosis not present

## 2017-04-20 DIAGNOSIS — G609 Hereditary and idiopathic neuropathy, unspecified: Secondary | ICD-10-CM | POA: Diagnosis not present

## 2017-04-20 DIAGNOSIS — M4120 Other idiopathic scoliosis, site unspecified: Secondary | ICD-10-CM | POA: Diagnosis not present

## 2017-04-21 ENCOUNTER — Ambulatory Visit (HOSPITAL_COMMUNITY)
Admission: RE | Admit: 2017-04-21 | Discharge: 2017-04-21 | Disposition: A | Discharge status: Home / Self Care | Payer: Medicare Other | Source: Ambulatory Visit | Attending: Vascular Surgery | Admitting: Vascular Surgery

## 2017-04-21 DIAGNOSIS — R609 Edema, unspecified: Secondary | ICD-10-CM | POA: Diagnosis not present

## 2017-04-24 ENCOUNTER — Telehealth: Payer: Self-pay | Admitting: Family Medicine

## 2017-04-24 NOTE — Telephone Encounter (Signed)
Noted  

## 2017-04-24 NOTE — Telephone Encounter (Signed)
Caller with [facility] states that OT will begin on 10/22.

## 2017-04-27 ENCOUNTER — Telehealth: Payer: Self-pay | Admitting: Family Medicine

## 2017-04-27 NOTE — Telephone Encounter (Signed)
Caller with [facility] home [facility] asking for verbal orders for PT 1 x a wk for 1 wk and then 2 x a wk for 7 wks.

## 2017-04-27 NOTE — Telephone Encounter (Signed)
Verbal ok given.

## 2017-04-28 ENCOUNTER — Telehealth: Payer: Self-pay | Admitting: Family Medicine

## 2017-04-28 DIAGNOSIS — R339 Retention of urine, unspecified: Secondary | ICD-10-CM | POA: Diagnosis not present

## 2017-04-28 DIAGNOSIS — N200 Calculus of kidney: Secondary | ICD-10-CM | POA: Diagnosis not present

## 2017-04-28 NOTE — Telephone Encounter (Signed)
[name] with [facility] calling to notify pcp that he was unable to complete occupation therapy evaluation scheduled for yesterday(04/27/17).  Patient was attempted to schedule an doctor's appt.  Patient did not call him back and he was unsuccessful with reach patient when he attempted to call her.  He is planning to see patient again tomorrow(04/29/17).

## 2017-04-29 ENCOUNTER — Telehealth: Payer: Self-pay | Admitting: Family Medicine

## 2017-04-29 DIAGNOSIS — M1990 Unspecified osteoarthritis, unspecified site: Secondary | ICD-10-CM | POA: Diagnosis not present

## 2017-04-29 DIAGNOSIS — M4120 Other idiopathic scoliosis, site unspecified: Secondary | ICD-10-CM | POA: Diagnosis not present

## 2017-04-29 DIAGNOSIS — G8929 Other chronic pain: Secondary | ICD-10-CM | POA: Diagnosis not present

## 2017-04-29 DIAGNOSIS — T84296D Other mechanical complication of internal fixation device of vertebrae, subsequent encounter: Secondary | ICD-10-CM | POA: Diagnosis not present

## 2017-04-29 DIAGNOSIS — I480 Paroxysmal atrial fibrillation: Secondary | ICD-10-CM | POA: Diagnosis not present

## 2017-04-29 DIAGNOSIS — G609 Hereditary and idiopathic neuropathy, unspecified: Secondary | ICD-10-CM | POA: Diagnosis not present

## 2017-04-29 NOTE — Telephone Encounter (Signed)
Fyi.

## 2017-04-29 NOTE — Telephone Encounter (Signed)
noted 

## 2017-04-29 NOTE — Telephone Encounter (Signed)
FYI

## 2017-04-29 NOTE — Telephone Encounter (Signed)
Goney with [facility] calling to notify pcp he evaluated patient today.  Patient is at baseline and comfortable with current set up.  Per patient she does not feels she needs PT or OT services at this time.

## 2017-04-30 DIAGNOSIS — I480 Paroxysmal atrial fibrillation: Secondary | ICD-10-CM | POA: Diagnosis not present

## 2017-04-30 DIAGNOSIS — G609 Hereditary and idiopathic neuropathy, unspecified: Secondary | ICD-10-CM | POA: Diagnosis not present

## 2017-04-30 DIAGNOSIS — M1990 Unspecified osteoarthritis, unspecified site: Secondary | ICD-10-CM | POA: Diagnosis not present

## 2017-04-30 DIAGNOSIS — T84296D Other mechanical complication of internal fixation device of vertebrae, subsequent encounter: Secondary | ICD-10-CM | POA: Diagnosis not present

## 2017-04-30 DIAGNOSIS — M4120 Other idiopathic scoliosis, site unspecified: Secondary | ICD-10-CM | POA: Diagnosis not present

## 2017-04-30 DIAGNOSIS — G8929 Other chronic pain: Secondary | ICD-10-CM | POA: Diagnosis not present

## 2017-05-01 DIAGNOSIS — M8580 Other specified disorders of bone density and structure, unspecified site: Secondary | ICD-10-CM | POA: Diagnosis not present

## 2017-05-01 DIAGNOSIS — Z4789 Encounter for other orthopedic aftercare: Secondary | ICD-10-CM | POA: Diagnosis not present

## 2017-05-01 DIAGNOSIS — Z981 Arthrodesis status: Secondary | ICD-10-CM | POA: Diagnosis not present

## 2017-05-02 DIAGNOSIS — Z23 Encounter for immunization: Secondary | ICD-10-CM | POA: Diagnosis not present

## 2017-05-04 DIAGNOSIS — M4120 Other idiopathic scoliosis, site unspecified: Secondary | ICD-10-CM | POA: Diagnosis not present

## 2017-05-06 DIAGNOSIS — M1990 Unspecified osteoarthritis, unspecified site: Secondary | ICD-10-CM | POA: Diagnosis not present

## 2017-05-06 DIAGNOSIS — T84296D Other mechanical complication of internal fixation device of vertebrae, subsequent encounter: Secondary | ICD-10-CM | POA: Diagnosis not present

## 2017-05-06 DIAGNOSIS — G609 Hereditary and idiopathic neuropathy, unspecified: Secondary | ICD-10-CM | POA: Diagnosis not present

## 2017-05-06 DIAGNOSIS — G8929 Other chronic pain: Secondary | ICD-10-CM | POA: Diagnosis not present

## 2017-05-06 DIAGNOSIS — M4120 Other idiopathic scoliosis, site unspecified: Secondary | ICD-10-CM | POA: Diagnosis not present

## 2017-05-06 DIAGNOSIS — I480 Paroxysmal atrial fibrillation: Secondary | ICD-10-CM | POA: Diagnosis not present

## 2017-05-07 DIAGNOSIS — T84296D Other mechanical complication of internal fixation device of vertebrae, subsequent encounter: Secondary | ICD-10-CM | POA: Diagnosis not present

## 2017-05-07 DIAGNOSIS — M1990 Unspecified osteoarthritis, unspecified site: Secondary | ICD-10-CM | POA: Diagnosis not present

## 2017-05-07 DIAGNOSIS — G8929 Other chronic pain: Secondary | ICD-10-CM | POA: Diagnosis not present

## 2017-05-07 DIAGNOSIS — G609 Hereditary and idiopathic neuropathy, unspecified: Secondary | ICD-10-CM | POA: Diagnosis not present

## 2017-05-07 DIAGNOSIS — I480 Paroxysmal atrial fibrillation: Secondary | ICD-10-CM | POA: Diagnosis not present

## 2017-05-07 DIAGNOSIS — M4120 Other idiopathic scoliosis, site unspecified: Secondary | ICD-10-CM | POA: Diagnosis not present

## 2017-05-08 DIAGNOSIS — R0602 Shortness of breath: Secondary | ICD-10-CM | POA: Diagnosis not present

## 2017-05-08 DIAGNOSIS — I480 Paroxysmal atrial fibrillation: Secondary | ICD-10-CM | POA: Diagnosis not present

## 2017-05-08 DIAGNOSIS — L570 Actinic keratosis: Secondary | ICD-10-CM | POA: Diagnosis not present

## 2017-05-08 DIAGNOSIS — Z7901 Long term (current) use of anticoagulants: Secondary | ICD-10-CM | POA: Diagnosis not present

## 2017-05-08 DIAGNOSIS — L821 Other seborrheic keratosis: Secondary | ICD-10-CM | POA: Diagnosis not present

## 2017-05-08 DIAGNOSIS — D692 Other nonthrombocytopenic purpura: Secondary | ICD-10-CM | POA: Diagnosis not present

## 2017-05-08 DIAGNOSIS — D1801 Hemangioma of skin and subcutaneous tissue: Secondary | ICD-10-CM | POA: Diagnosis not present

## 2017-05-08 DIAGNOSIS — F410 Panic disorder [episodic paroxysmal anxiety] without agoraphobia: Secondary | ICD-10-CM | POA: Diagnosis not present

## 2017-05-08 DIAGNOSIS — R609 Edema, unspecified: Secondary | ICD-10-CM | POA: Diagnosis not present

## 2017-05-08 DIAGNOSIS — I348 Other nonrheumatic mitral valve disorders: Secondary | ICD-10-CM | POA: Diagnosis not present

## 2017-05-08 DIAGNOSIS — Z85820 Personal history of malignant melanoma of skin: Secondary | ICD-10-CM | POA: Diagnosis not present

## 2017-05-08 DIAGNOSIS — L814 Other melanin hyperpigmentation: Secondary | ICD-10-CM | POA: Diagnosis not present

## 2017-05-12 DIAGNOSIS — M4120 Other idiopathic scoliosis, site unspecified: Secondary | ICD-10-CM | POA: Diagnosis not present

## 2017-05-12 DIAGNOSIS — T84296D Other mechanical complication of internal fixation device of vertebrae, subsequent encounter: Secondary | ICD-10-CM | POA: Diagnosis not present

## 2017-05-12 DIAGNOSIS — M1990 Unspecified osteoarthritis, unspecified site: Secondary | ICD-10-CM | POA: Diagnosis not present

## 2017-05-12 DIAGNOSIS — G8929 Other chronic pain: Secondary | ICD-10-CM | POA: Diagnosis not present

## 2017-05-12 DIAGNOSIS — G609 Hereditary and idiopathic neuropathy, unspecified: Secondary | ICD-10-CM | POA: Diagnosis not present

## 2017-05-12 DIAGNOSIS — I480 Paroxysmal atrial fibrillation: Secondary | ICD-10-CM | POA: Diagnosis not present

## 2017-05-14 DIAGNOSIS — I480 Paroxysmal atrial fibrillation: Secondary | ICD-10-CM | POA: Diagnosis not present

## 2017-05-14 DIAGNOSIS — G8929 Other chronic pain: Secondary | ICD-10-CM | POA: Diagnosis not present

## 2017-05-14 DIAGNOSIS — M1990 Unspecified osteoarthritis, unspecified site: Secondary | ICD-10-CM | POA: Diagnosis not present

## 2017-05-14 DIAGNOSIS — G609 Hereditary and idiopathic neuropathy, unspecified: Secondary | ICD-10-CM | POA: Diagnosis not present

## 2017-05-14 DIAGNOSIS — M4120 Other idiopathic scoliosis, site unspecified: Secondary | ICD-10-CM | POA: Diagnosis not present

## 2017-05-14 DIAGNOSIS — T84296D Other mechanical complication of internal fixation device of vertebrae, subsequent encounter: Secondary | ICD-10-CM | POA: Diagnosis not present

## 2017-05-22 DIAGNOSIS — I480 Paroxysmal atrial fibrillation: Secondary | ICD-10-CM | POA: Diagnosis not present

## 2017-05-22 DIAGNOSIS — M4120 Other idiopathic scoliosis, site unspecified: Secondary | ICD-10-CM | POA: Diagnosis not present

## 2017-05-22 DIAGNOSIS — G609 Hereditary and idiopathic neuropathy, unspecified: Secondary | ICD-10-CM | POA: Diagnosis not present

## 2017-05-22 DIAGNOSIS — T84296D Other mechanical complication of internal fixation device of vertebrae, subsequent encounter: Secondary | ICD-10-CM | POA: Diagnosis not present

## 2017-05-22 DIAGNOSIS — M1990 Unspecified osteoarthritis, unspecified site: Secondary | ICD-10-CM | POA: Diagnosis not present

## 2017-05-22 DIAGNOSIS — G8929 Other chronic pain: Secondary | ICD-10-CM | POA: Diagnosis not present

## 2017-05-25 DIAGNOSIS — G609 Hereditary and idiopathic neuropathy, unspecified: Secondary | ICD-10-CM | POA: Diagnosis not present

## 2017-05-25 DIAGNOSIS — T84296D Other mechanical complication of internal fixation device of vertebrae, subsequent encounter: Secondary | ICD-10-CM | POA: Diagnosis not present

## 2017-05-25 DIAGNOSIS — G8929 Other chronic pain: Secondary | ICD-10-CM | POA: Diagnosis not present

## 2017-05-25 DIAGNOSIS — M1990 Unspecified osteoarthritis, unspecified site: Secondary | ICD-10-CM | POA: Diagnosis not present

## 2017-05-25 DIAGNOSIS — I480 Paroxysmal atrial fibrillation: Secondary | ICD-10-CM | POA: Diagnosis not present

## 2017-05-25 DIAGNOSIS — M4120 Other idiopathic scoliosis, site unspecified: Secondary | ICD-10-CM | POA: Diagnosis not present

## 2017-05-29 DIAGNOSIS — T84296D Other mechanical complication of internal fixation device of vertebrae, subsequent encounter: Secondary | ICD-10-CM | POA: Diagnosis not present

## 2017-05-29 DIAGNOSIS — G609 Hereditary and idiopathic neuropathy, unspecified: Secondary | ICD-10-CM | POA: Diagnosis not present

## 2017-05-29 DIAGNOSIS — G8929 Other chronic pain: Secondary | ICD-10-CM | POA: Diagnosis not present

## 2017-05-29 DIAGNOSIS — M4120 Other idiopathic scoliosis, site unspecified: Secondary | ICD-10-CM | POA: Diagnosis not present

## 2017-05-29 DIAGNOSIS — M1990 Unspecified osteoarthritis, unspecified site: Secondary | ICD-10-CM | POA: Diagnosis not present

## 2017-05-29 DIAGNOSIS — I480 Paroxysmal atrial fibrillation: Secondary | ICD-10-CM | POA: Diagnosis not present

## 2017-06-01 DIAGNOSIS — I480 Paroxysmal atrial fibrillation: Secondary | ICD-10-CM | POA: Diagnosis not present

## 2017-06-01 DIAGNOSIS — T84296D Other mechanical complication of internal fixation device of vertebrae, subsequent encounter: Secondary | ICD-10-CM | POA: Diagnosis not present

## 2017-06-01 DIAGNOSIS — G8929 Other chronic pain: Secondary | ICD-10-CM | POA: Diagnosis not present

## 2017-06-01 DIAGNOSIS — G609 Hereditary and idiopathic neuropathy, unspecified: Secondary | ICD-10-CM | POA: Diagnosis not present

## 2017-06-01 DIAGNOSIS — M4120 Other idiopathic scoliosis, site unspecified: Secondary | ICD-10-CM | POA: Diagnosis not present

## 2017-06-01 DIAGNOSIS — M1990 Unspecified osteoarthritis, unspecified site: Secondary | ICD-10-CM | POA: Diagnosis not present

## 2017-06-02 DIAGNOSIS — M4120 Other idiopathic scoliosis, site unspecified: Secondary | ICD-10-CM | POA: Diagnosis not present

## 2017-06-02 DIAGNOSIS — M1990 Unspecified osteoarthritis, unspecified site: Secondary | ICD-10-CM | POA: Diagnosis not present

## 2017-06-02 DIAGNOSIS — I480 Paroxysmal atrial fibrillation: Secondary | ICD-10-CM | POA: Diagnosis not present

## 2017-06-02 DIAGNOSIS — G8929 Other chronic pain: Secondary | ICD-10-CM | POA: Diagnosis not present

## 2017-06-02 DIAGNOSIS — T84296D Other mechanical complication of internal fixation device of vertebrae, subsequent encounter: Secondary | ICD-10-CM | POA: Diagnosis not present

## 2017-06-02 DIAGNOSIS — G609 Hereditary and idiopathic neuropathy, unspecified: Secondary | ICD-10-CM | POA: Diagnosis not present

## 2017-06-03 DIAGNOSIS — M961 Postlaminectomy syndrome, not elsewhere classified: Secondary | ICD-10-CM | POA: Diagnosis not present

## 2017-06-03 DIAGNOSIS — Z79891 Long term (current) use of opiate analgesic: Secondary | ICD-10-CM | POA: Diagnosis not present

## 2017-06-03 DIAGNOSIS — G894 Chronic pain syndrome: Secondary | ICD-10-CM | POA: Diagnosis not present

## 2017-06-03 DIAGNOSIS — M4693 Unspecified inflammatory spondylopathy, cervicothoracic region: Secondary | ICD-10-CM | POA: Diagnosis not present

## 2017-06-04 DIAGNOSIS — M542 Cervicalgia: Secondary | ICD-10-CM | POA: Diagnosis not present

## 2017-06-04 DIAGNOSIS — Z4789 Encounter for other orthopedic aftercare: Secondary | ICD-10-CM | POA: Diagnosis not present

## 2017-06-04 DIAGNOSIS — M79604 Pain in right leg: Secondary | ICD-10-CM | POA: Diagnosis not present

## 2017-06-04 DIAGNOSIS — M25519 Pain in unspecified shoulder: Secondary | ICD-10-CM | POA: Diagnosis not present

## 2017-06-08 DIAGNOSIS — M1990 Unspecified osteoarthritis, unspecified site: Secondary | ICD-10-CM | POA: Diagnosis not present

## 2017-06-08 DIAGNOSIS — M4120 Other idiopathic scoliosis, site unspecified: Secondary | ICD-10-CM | POA: Diagnosis not present

## 2017-06-08 DIAGNOSIS — I480 Paroxysmal atrial fibrillation: Secondary | ICD-10-CM | POA: Diagnosis not present

## 2017-06-08 DIAGNOSIS — T84296D Other mechanical complication of internal fixation device of vertebrae, subsequent encounter: Secondary | ICD-10-CM | POA: Diagnosis not present

## 2017-06-08 DIAGNOSIS — G8929 Other chronic pain: Secondary | ICD-10-CM | POA: Diagnosis not present

## 2017-06-08 DIAGNOSIS — G609 Hereditary and idiopathic neuropathy, unspecified: Secondary | ICD-10-CM | POA: Diagnosis not present

## 2017-07-09 ENCOUNTER — Encounter: Payer: Self-pay | Admitting: Family Medicine

## 2017-07-09 ENCOUNTER — Other Ambulatory Visit: Payer: Self-pay

## 2017-07-09 ENCOUNTER — Ambulatory Visit (INDEPENDENT_AMBULATORY_CARE_PROVIDER_SITE_OTHER): Payer: Medicare Other | Admitting: Family Medicine

## 2017-07-09 VITALS — BP 102/70 | HR 70 | Temp 97.9°F | Resp 17 | Ht 68.0 in | Wt 123.0 lb

## 2017-07-09 DIAGNOSIS — H6123 Impacted cerumen, bilateral: Secondary | ICD-10-CM

## 2017-07-09 DIAGNOSIS — G8929 Other chronic pain: Secondary | ICD-10-CM | POA: Diagnosis not present

## 2017-07-09 DIAGNOSIS — R42 Dizziness and giddiness: Secondary | ICD-10-CM

## 2017-07-09 MED ORDER — ONDANSETRON HCL 4 MG PO TABS: 4.0000 mg | ORAL_TABLET | Freq: Three times a day (TID) | ORAL | 0 refills | Status: DC | PRN

## 2017-07-09 MED ORDER — MECLIZINE HCL 25 MG PO TABS: 25.0000 mg | ORAL_TABLET | Freq: Three times a day (TID) | ORAL | 0 refills | Status: DC | PRN

## 2017-07-09 NOTE — Patient Instructions (Signed)
Follow up as needed or as scheduled Your dizziness and nausea is due to your vertigo Use the Meclizine as needed for the dizziness and the Zofran as needed for nausea Drink plenty of fluids Change positions slowly to allow yourself time to adjust Use peroxide in your ears to soften the wax- if no improvement, please let us know so we can schedule a wax removal visit Ask your pain management doctor about adding Lyrica to help w/ the pain in your head Call with any questions or concerns DON'T GIVE UP!!!

## 2017-07-09 NOTE — Assessment & Plan Note (Signed)
Pt has bilateral cerumen impaction which has muffled her hearing.  Due to her vertigo will not irrigate her ears today and advised hydrogen peroxide drops in ears.  Pt expressed understanding and is in agreement w/ plan.

## 2017-07-09 NOTE — Progress Notes (Signed)
   Subjective:    Patient ID: [name], female    DOB: 06-17-1946, 72 y.o.   MRN: [id number]  HPI 'I just feel fatigued and light headed'- pt reports it came on 'all of a sudden'.  Pt has had multiple neck and back surgeries (most recently 03/18/17) and deals w/ chronic pain.  She reports since 12/27 she has been excessively fatigued and nauseated.  'I just want to lay my head down'.  Pt has been taking 8.19ml of oxydone every 4 hrs (from pain management).  Feels most recent refill is 'stronger' than previous medication.  Pt feels that the 'room is about to swirl'.  Has sense of being about to fall when she gets up.  Pt reports that she has severe pain in her head since surgery and was told that this was due to nerve regeneration.  Is not on gabapentin nor Lyrica.  Pt has hx of vertigo and reports this feels similar.  'my head feels stopped up'.  Dizziness improves when lying down or reclining in chair.  Worsens w/ movement.   Review of Systems For ROS see HPI     Objective:   Physical Exam  Constitutional: She is oriented to person, place, and time. She appears well-developed. No distress.  HENT:  Head: Normocephalic and atraumatic.  Mouth/Throat: Uvula is midline and mucous membranes are normal.  TMs obscured by wax bilaterally No TTP over sinuses Minimal nasal congestion  Eyes: Conjunctivae and EOM are normal. Pupils are equal, round, and reactive to light.  2-3 beats of horizontal nystagmus when looking L  Neck: Normal range of motion. Neck supple.  Cardiovascular: Normal rate, regular rhythm, normal heart sounds and intact distal pulses.  Pulmonary/Chest: Effort normal and breath sounds normal. No respiratory distress. She has no wheezes. She has no rales.  Musculoskeletal: She exhibits no edema.  Lymphadenopathy:    She has no cervical adenopathy.  Neurological: She is alert and oriented to person, place, and time. She has normal reflexes. No cranial nerve deficit.  Skin: Skin is  warm and dry.  Psychiatric: Her behavior is normal. Thought content normal.  anxious  Vitals reviewed.         Assessment & Plan:

## 2017-07-09 NOTE — Assessment & Plan Note (Signed)
Ongoing issue for pt after multiple back and neck surgeries.  Seeing Pain Management and is on regularly scheduled narcotics.  She keeps describing a nerve burning pain in head and neck.  Encouraged her to inquire about gabapentin or Lyrica to improve pain.  Will follow along.

## 2017-07-09 NOTE — Assessment & Plan Note (Signed)
Pt's sxs and PE are consistent w/ vertigo.  She has a hx of this and reports it feels similar.  Restart Meclizine and Zofran.  Reassurance provided.  Reviewed supportive care and red flags that should prompt return.  Pt expressed understanding and is in agreement w/ plan.

## 2017-07-30 DIAGNOSIS — Z79891 Long term (current) use of opiate analgesic: Secondary | ICD-10-CM | POA: Diagnosis not present

## 2017-07-30 DIAGNOSIS — G894 Chronic pain syndrome: Secondary | ICD-10-CM | POA: Diagnosis not present

## 2017-07-30 DIAGNOSIS — M4693 Unspecified inflammatory spondylopathy, cervicothoracic region: Secondary | ICD-10-CM | POA: Diagnosis not present

## 2017-07-30 DIAGNOSIS — M961 Postlaminectomy syndrome, not elsewhere classified: Secondary | ICD-10-CM | POA: Diagnosis not present

## 2017-08-03 DIAGNOSIS — S2249XD Multiple fractures of ribs, unspecified side, subsequent encounter for fracture with routine healing: Secondary | ICD-10-CM | POA: Diagnosis not present

## 2017-08-03 DIAGNOSIS — Z981 Arthrodesis status: Secondary | ICD-10-CM | POA: Diagnosis not present

## 2017-08-03 DIAGNOSIS — M4186 Other forms of scoliosis, lumbar region: Secondary | ICD-10-CM | POA: Diagnosis not present

## 2017-08-03 DIAGNOSIS — M8580 Other specified disorders of bone density and structure, unspecified site: Secondary | ICD-10-CM | POA: Diagnosis not present

## 2017-08-03 DIAGNOSIS — M5136 Other intervertebral disc degeneration, lumbar region: Secondary | ICD-10-CM | POA: Diagnosis not present

## 2017-08-03 DIAGNOSIS — M549 Dorsalgia, unspecified: Secondary | ICD-10-CM | POA: Diagnosis not present

## 2017-08-03 DIAGNOSIS — M8588 Other specified disorders of bone density and structure, other site: Secondary | ICD-10-CM | POA: Diagnosis not present

## 2017-08-14 ENCOUNTER — Encounter: Payer: Self-pay | Admitting: Podiatry

## 2017-08-14 ENCOUNTER — Ambulatory Visit (INDEPENDENT_AMBULATORY_CARE_PROVIDER_SITE_OTHER): Payer: Medicare Other

## 2017-08-14 ENCOUNTER — Ambulatory Visit (INDEPENDENT_AMBULATORY_CARE_PROVIDER_SITE_OTHER): Payer: Medicare Other | Admitting: Podiatry

## 2017-08-14 ENCOUNTER — Other Ambulatory Visit: Payer: Self-pay | Admitting: Podiatry

## 2017-08-14 DIAGNOSIS — L84 Corns and callosities: Secondary | ICD-10-CM

## 2017-08-14 DIAGNOSIS — M79671 Pain in right foot: Secondary | ICD-10-CM

## 2017-08-14 DIAGNOSIS — D689 Coagulation defect, unspecified: Secondary | ICD-10-CM | POA: Diagnosis not present

## 2017-08-14 DIAGNOSIS — M79672 Pain in left foot: Secondary | ICD-10-CM

## 2017-08-14 DIAGNOSIS — M779 Enthesopathy, unspecified: Secondary | ICD-10-CM

## 2017-08-14 MED ORDER — TRIAMCINOLONE ACETONIDE 10 MG/ML IJ SUSP
10.0000 mg | Freq: Once | INTRAMUSCULAR | Status: AC
  Administered 2017-08-14: 10 mg

## 2017-08-16 NOTE — Progress Notes (Signed)
Subjective:   Patient ID: [name], female   DOB: 72 y.o.   MRN: [id number]   HPI Patient presents with severe keratotic lesions plantar aspect right and left foot first fifth metatarsal with the right fifth metatarsal being very inflamed with fluid buildup and very painful for the patient   ROS      Objective:  Physical Exam  Neurovascular status intact with patient on blood thinner at the current time.  Patient is noted to have inflammatory capsulitis fifth MPJ right and keratotic lesions first MPJ right and first and fifth metatarsal right that are painful.     Assessment:  At risk patient with inflammatory capsulitis fifth MPJ right with lesion formation first bilateral fifth left     Plan:  Careful injection of the plantar capsule administered right 3 mg dexamethasone Kenalog after appropriate sterile prep of the area.  I then debrided all lesions with no iatrogenic bleeding and will be seen back and may require orthotics or more aggressive treatment if symptoms were to persist or not get better

## 2017-09-14 DIAGNOSIS — R0602 Shortness of breath: Secondary | ICD-10-CM | POA: Diagnosis not present

## 2017-09-14 DIAGNOSIS — I480 Paroxysmal atrial fibrillation: Secondary | ICD-10-CM | POA: Diagnosis not present

## 2017-09-14 DIAGNOSIS — Z7901 Long term (current) use of anticoagulants: Secondary | ICD-10-CM | POA: Diagnosis not present

## 2017-09-14 DIAGNOSIS — F410 Panic disorder [episodic paroxysmal anxiety] without agoraphobia: Secondary | ICD-10-CM | POA: Diagnosis not present

## 2017-09-14 DIAGNOSIS — I349 Nonrheumatic mitral valve disorder, unspecified: Secondary | ICD-10-CM | POA: Diagnosis not present

## 2017-09-14 DIAGNOSIS — I348 Other nonrheumatic mitral valve disorders: Secondary | ICD-10-CM | POA: Diagnosis not present

## 2017-09-22 NOTE — Progress Notes (Addendum)
Subjective:   [name] is a 72 y.o. female who presents for Medicare Annual (Subsequent) preventive examination.  Review of Systems:  No ROS.  Medicare Wellness Visit. Additional risk factors are reflected in the social history.  Cardiac Risk Factors include: advanced age (>29men, >67 women);family history of premature cardiovascular disease   Sleep patterns: Sleeps 6 hours.  Home Safety/Smoke Alarms: Feels safe in home. Smoke alarms in place.  Living environment; residence and Firearm Safety: Son lives with patient in 2 story home. Has "stair climber" installed.  Seat Belt Safety/Bike Helmet: Wears seat belt.   Female:   Pap-N/A       Mammo-02/12/2017, benign.        Dexa scan-05/22/2016, Osteoporosis.         CCS-Colonoscopy 02/05/2009, normal. Recall 5 years, [place].        Objective:     Vitals: BP 110/60 (BP Location: Left Arm, Patient Position: Sitting, Cuff Size: Normal)   Pulse 77   Temp 97.7 F (36.5 C) (Temporal)   Resp 16   Ht 5\' 8"  (1.727 m)   Wt 123 lb 9.6 oz (56.1 kg)   SpO2 98%   BMI 18.79 kg/m   Body mass index is 18.79 kg/m.  Advanced Directives 09/23/2017 08/06/2014  Does Patient Have a Medical Advance Directive? No No  Would patient like information on creating a medical advance directive? Yes (MAU/Ambulatory/Procedural Areas - Information given) No - patient declined information    Tobacco Social History   Tobacco Use  Smoking Status Former Smoker  . Packs/day: 0.25  . Years: 14.00  . Pack years: 3.50  . Types: Cigarettes  . Last attempt to quit: 07/07/1984  . Years since quitting: 33.2  Smokeless Tobacco Never Used     Counseling given: Not Answered    Past Medical History:  Diagnosis Date  . Allergy   . Anxiety   . Benign paroxysmal positional vertigo 06/08/2013  . Chronic a-fib ([place]) 08/07/2014  . Fracture of multiple ribs   . GERD (gastroesophageal reflux disease)   . Hair loss 04/12/2012  . Herpes   . IC (intermittent  claudication) ([place])   . Interstitial cystitis 11/06/2011  . Kidney stone   . 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 Osteopenia 02/18/2012  . Osteoporosis   . PAF (paroxysmal atrial fibrillation) ([place]) 2012  . Peripheral neuropathy 11/06/2011  . PMDD (premenstrual dysphoric disorder)   . Rotator cuff tear   . Vaginal delivery    ONE NSVD  . 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 BACK SURGERY     DISC FUSION L5,L6,L7  . BLADDER SURGERY     Bladder sling  . BREAST SURGERY     BREAST BIOPSY--RIGHT BENIGN  . BUNIONECTOMY    . HEAD & NECK SKIN LESION EXCISIONAL BIOPSY     melanoma  . NECK FUSION    . ROTATOR CUFF REPAIR  2012   RIGHT  . SPINAL FUSION  dec 2015   . VAGINAL HYSTERECTOMY     TVH   Family History  Problem Relation Age of Onset  . Diabetes Father   . Hyperlipidemia Sister   . Heart disease Sister   . Stroke Sister   . Diabetes Brother   . Hyperlipidemia Sister   . Heart disease Sister   . Arthritis Mother   . Cancer Mother        uterus  . Heart disease Mother   . Diabetes  Brother   . Heart Problems Brother    Social History   Socioeconomic History  . Marital status: Divorced    Spouse name: None  . Number of children: None  . Years of education: None  . Highest education level: None  Social Needs  . Financial resource strain: None  . Food insecurity - worry: None  . Food insecurity - inability: None  . Transportation needs - medical: None  . Transportation needs - non-medical: None  Occupational History  . None  Tobacco Use  . Smoking status: Former Smoker    Packs/day: 0.25    Years: 14.00    Pack years: 3.50    Types: Cigarettes    Last attempt to quit: 07/07/1984    Years since quitting: 33.2  . Smokeless tobacco: Never Used  Substance and Sexual Activity  . Alcohol use: No    Alcohol/week: 0.0 oz  . Drug use: No  . Sexual activity: None  Other Topics Concern  . None  Social History Narrative  . None     Outpatient Encounter Medications as of 09/23/2017  Medication Sig  . acetaminophen (TYLENOL) 500 MG tablet Take 1,000 mg by mouth every 6 (six) hours as needed for mild pain.   . Calcium-Vitamin D-Vitamin K (VIACTIV PO) Take by mouth.  . diltiazem (CARDIZEM CD) 120 MG 24 hr capsule Take 1 capsule by mouth daily.  . meclizine (ANTIVERT) 25 MG tablet Take 1 tablet (25 mg total) by mouth 3 (three) times daily as needed for dizziness.  . metoprolol tartrate (LOPRESSOR) 25 MG tablet Take 1 tablet (25 mg total) by mouth 2 (two) times daily.  . ondansetron (ZOFRAN) 4 MG tablet Take 1 tablet (4 mg total) by mouth every 8 (eight) hours as needed for nausea or vomiting.  [name] oxyCODONE (ROXICODONE) 5 MG/5ML solution 5-10 mls every 4-6 hours  . polyethylene glycol powder (GLYCOLAX/MIRALAX) powder   . rivaroxaban (XARELTO) 20 MG TABS tablet Take 1 tablet by mouth daily.  . valACYclovir (VALTREX) 500 MG tablet as needed.   . [DISCONTINUED] sertraline (ZOLOFT) 50 MG tablet Take 1 tablet (50 mg total) by mouth daily.   No facility-administered encounter medications on file as of 09/23/2017.     Activities of Daily Living In your present state of health, do you have any difficulty performing the following activities: 09/23/2017 09/23/2017  Hearing? N N  Vision? N N  Difficulty concentrating or making decisions? N N  Walking or climbing stairs? N Y  Dressing or bathing? N N  Doing errands, shopping? N N  Preparing Food and eating ? - N  Using the Toilet? - N  In the past six months, have you accidently leaked urine? - N  Do you have problems with loss of bowel control? - N  Managing your Medications? - N  Managing your Finances? - N  Housekeeping or managing your Housekeeping? - N  Some recent data might be hidden    Patient Care Team: [name], MD as PCP - General (Family Medicine) Domingo Pulse, MD as Consulting Physician (Urology) Richmond Campbell, MD as Consulting Physician  (Gastroenterology) Atilano Ina, MD as Referring Physician (Neurosurgery) Terrance Mass, MD as Consulting Physician (Gynecology) Jacolyn Reedy, MD as Consulting Physician (Cardiology) Kriste Basque, MD as Referring Physician (Pain Medicine) [facility] Rolm Bookbinder, MD as Consulting Physician (Dermatology)    Assessment:   This is a routine wellness examination for [place].  Exercise Activities and Dietary recommendations  Current Exercise Habits: Home exercise routine, Type of exercise: walking, Exercise limited by: orthopedic condition(s)   Diet (meal preparation, eat out, water intake, caffeinated beverages, dairy products, fruits and vegetables): Drinks water.   Breakfast: Eggs, toast, bacon/sausage, cereal/fruit Lunch: sandwich  Dinner: protein and vegetables.      Goals    . Patient Stated     Walk better and improve balance.        Fall Risk Fall Risk  09/23/2017 09/23/2017 04/21/2016 08/17/2015 04/19/2015  Falls in the past year? No No No No No     Depression Screen PHQ 2/9 Scores 09/23/2017 09/23/2017 07/09/2017 02/18/2017  PHQ - 2 Score 0 0 0 1  PHQ- 9 Score 0 - 0 2  Exception Documentation - - - -  Some encounter information is confidential and restricted. Go to Review Flowsheets activity to see all data.     Cognitive Function       Ad8 score reviewed for issues:  Issues making decisions: no  Less interest in hobbies / activities: no  Repeats questions, stories (family complaining): no  Trouble using ordinary gadgets (microwave, computer, phone): no  Forgets the month or year: no  Mismanaging finances: no  Remembering appts:no  Daily problems with thinking and/or memory: no Ad8 score is=0     Immunization History  Administered Date(s) Administered  . DTaP 07/07/2009  . Influenza Split 03/07/2012  . Influenza, High Dose Seasonal PF 05/02/2016  . Influenza,inj,Quad PF,6+ Mos 06/08/2013, 04/06/2015,  05/02/2017  . Pneumococcal Conjugate-13 04/19/2015  . Pneumococcal Polysaccharide-23 08/07/2009, 12/27/2013  . Zoster 07/07/2010    Screening Tests Health Maintenance  Topic Date Due  . MAMMOGRAM  02/12/2018  . COLONOSCOPY  02/06/2019  . TETANUS/TDAP  07/08/2019  . INFLUENZA VACCINE  Completed  . DEXA SCAN  Completed  . Hepatitis C Screening  Completed  . PNA vac Low Risk Adult  Completed        Plan:    Continue doing brain stimulating activities (puzzles, reading, adult coloring books, staying active) to keep memory sharp.   Bring a copy of your living will and/or healthcare power of attorney to your next office visit.  I have personally reviewed and noted the following in the patient's chart:   . Medical and social history . Use of alcohol, tobacco or illicit drugs  . Current medications and supplements . Functional ability and status . Nutritional status . Physical activity . Advanced directives . List of other physicians . Hospitalizations, surgeries, and ER visits in previous 12 months . Vitals . Screenings to include cognitive, depression, and falls . Referrals and appointments  In addition, I have reviewed and discussed with patient certain preventive protocols, quality metrics, and best practice recommendations. A written personalized care plan for preventive services as well as general preventive health recommendations were provided to patient.     Gerilyn Nestle, RN  09/23/2017  Reviewed documentation provided by RN and agree w/ above.  [name], MD

## 2017-09-23 ENCOUNTER — Other Ambulatory Visit: Payer: Self-pay

## 2017-09-23 ENCOUNTER — Ambulatory Visit (INDEPENDENT_AMBULATORY_CARE_PROVIDER_SITE_OTHER): Payer: Medicare Other | Admitting: Family Medicine

## 2017-09-23 ENCOUNTER — Encounter: Payer: Self-pay | Admitting: Family Medicine

## 2017-09-23 ENCOUNTER — Ambulatory Visit (INDEPENDENT_AMBULATORY_CARE_PROVIDER_SITE_OTHER): Payer: Medicare Other

## 2017-09-23 VITALS — BP 110/60 | HR 77 | Temp 97.7°F | Resp 16 | Ht 68.0 in | Wt 123.6 lb

## 2017-09-23 VITALS — BP 110/60 | HR 77 | Temp 97.7°F | Resp 16 | Ht 68.0 in | Wt 123.4 lb

## 2017-09-23 DIAGNOSIS — F329 Major depressive disorder, single episode, unspecified: Secondary | ICD-10-CM | POA: Diagnosis not present

## 2017-09-23 DIAGNOSIS — G8929 Other chronic pain: Secondary | ICD-10-CM

## 2017-09-23 DIAGNOSIS — E440 Moderate protein-calorie malnutrition: Secondary | ICD-10-CM

## 2017-09-23 DIAGNOSIS — E785 Hyperlipidemia, unspecified: Secondary | ICD-10-CM

## 2017-09-23 DIAGNOSIS — Z0000 Encounter for general adult medical examination without abnormal findings: Secondary | ICD-10-CM

## 2017-09-23 DIAGNOSIS — F419 Anxiety disorder, unspecified: Secondary | ICD-10-CM

## 2017-09-23 DIAGNOSIS — I480 Paroxysmal atrial fibrillation: Secondary | ICD-10-CM

## 2017-09-23 DIAGNOSIS — F32A Depression, unspecified: Secondary | ICD-10-CM

## 2017-09-23 DIAGNOSIS — M810 Age-related osteoporosis without current pathological fracture: Secondary | ICD-10-CM

## 2017-09-23 LAB — VITAMIN D 25 HYDROXY (VIT D DEFICIENCY, FRACTURES): VITD: 27.58 ng/mL — ABNORMAL LOW (ref 30.00–100.00)

## 2017-09-23 LAB — CBC WITH DIFFERENTIAL/PLATELET
Basophils Absolute: 0 10*3/uL (ref 0.0–0.1)
Basophils Relative: 0.7 % (ref 0.0–3.0)
Eosinophils Absolute: 0.3 10*3/uL (ref 0.0–0.7)
Eosinophils Relative: 6.1 % — ABNORMAL HIGH (ref 0.0–5.0)
HCT: 39.6 % (ref 36.0–46.0)
Hemoglobin: 13.2 g/dL (ref 12.0–15.0)
Lymphocytes Relative: 24.1 % (ref 12.0–46.0)
Lymphs Abs: 1.1 10*3/uL (ref 0.7–4.0)
MCHC: 33.3 g/dL (ref 30.0–36.0)
MCV: 90 fl (ref 78.0–100.0)
Monocytes Absolute: 0.3 10*3/uL (ref 0.1–1.0)
Monocytes Relative: 7.6 % (ref 3.0–12.0)
Neutro Abs: 2.8 10*3/uL (ref 1.4–7.7)
Neutrophils Relative %: 61.5 % (ref 43.0–77.0)
Platelets: 168 10*3/uL (ref 150.0–400.0)
RBC: 4.4 Mil/uL (ref 3.87–5.11)
RDW: 14.9 % (ref 11.5–15.5)
WBC: 4.6 10*3/uL (ref 4.0–10.5)

## 2017-09-23 LAB — TSH: TSH: 2.7 u[IU]/mL (ref 0.35–4.50)

## 2017-09-23 NOTE — Assessment & Plan Note (Signed)
Following w/ pain management and remains on chronic Oxycodone.  Agree w/ pain management doctor that she needs PT to improve her stamina and mobility.  Told her this very plainly.

## 2017-09-23 NOTE — Patient Instructions (Signed)
Follow up in 1 year or as needed We'll notify you of your lab results and make any changes if needed I strongly encourage you to start physical therapy to work on your stamina Make sure you are drinking at least 1 protein shake daily Call with any questions or concerns [place]!!!

## 2017-09-23 NOTE — Patient Instructions (Addendum)
Continue doing brain stimulating activities (puzzles, reading, adult coloring books, staying active) to keep memory sharp.   Bring a copy of your living will and/or healthcare power of attorney to your next office visit.   Health Maintenance, Female Adopting a healthy lifestyle and getting preventive care can go a long way to promote health and wellness. Talk with your health care provider about what schedule of regular examinations is right for you. This is a good chance for you to check in with your provider about disease prevention and staying healthy. In between checkups, there are plenty of things you can do on your own. Experts have done a lot of research about which lifestyle changes and preventive measures are most likely to keep you healthy. Ask your health care provider for more information. Weight and diet Eat a healthy diet  Be sure to include plenty of vegetables, fruits, low-fat dairy products, and lean protein.  Do not eat a lot of foods high in solid fats, added sugars, or salt.  Get regular exercise. This is one of the most important things you can do for your health. ? Most adults should exercise for at least 150 minutes each week. The exercise should increase your heart rate and make you sweat (moderate-intensity exercise). ? Most adults should also do strengthening exercises at least twice a week. This is in addition to the moderate-intensity exercise.  Maintain a healthy weight  Body mass index (BMI) is a measurement that can be used to identify possible weight problems. It estimates body fat based on height and weight. Your health care provider can help determine your BMI and help you achieve or maintain a healthy weight.  For females 20 years of age and older: ? A BMI below 18.5 is considered underweight. ? A BMI of 18.5 to 24.9 is normal. ? A BMI of 25 to 29.9 is considered overweight. ? A BMI of 30 and above is considered obese.  Watch levels of cholesterol and  blood lipids  You should start having your blood tested for lipids and cholesterol at 73 years of age, then have this test every 5 years.  You may need to have your cholesterol levels checked more often if: ? Your lipid or cholesterol levels are high. ? You are older than 73 years of age. ? You are at high risk for heart disease.  Cancer screening Lung Cancer  Lung cancer screening is recommended for adults 55-80 years old who are at high risk for lung cancer because of a history of smoking.  A yearly low-dose CT scan of the lungs is recommended for people who: ? Currently smoke. ? Have quit within the past 15 years. ? Have at least a 30-pack-year history of smoking. A pack year is smoking an average of one pack of cigarettes a day for 1 year.  Yearly screening should continue until it has been 15 years since you quit.  Yearly screening should stop if you develop a health problem that would prevent you from having lung cancer treatment.  Breast Cancer  Practice breast self-awareness. This means understanding how your breasts normally appear and feel.  It also means doing regular breast self-exams. Let your health care provider know about any changes, no matter how small.  If you are in your 20s or 30s, you should have a clinical breast exam (CBE) by a health care provider every 1-3 years as part of a regular health exam.  If you are 40 or older, have a CBE   year. Also consider having a breast X-ray (mammogram) every year.  If you have a family history of breast cancer, talk to your health care provider about genetic screening.  If you are at high risk for breast cancer, talk to your health care provider about having an MRI and a mammogram every year.  Breast cancer gene (BRCA) assessment is recommended for women who have family members with BRCA-related cancers. BRCA-related cancers include: ? Breast. ? Ovarian. ? Tubal. ? Peritoneal cancers.  Results of the assessment  will determine the need for genetic counseling and BRCA1 and BRCA2 testing.  Cervical Cancer Your health care provider may recommend that you be screened regularly for cancer of the pelvic organs (ovaries, uterus, and vagina). This screening involves a pelvic examination, including checking for microscopic changes to the surface of your cervix (Pap test). You may be encouraged to have this screening done every 3 years, beginning at age 67.  For women ages 29-65, health care providers may recommend pelvic exams and Pap testing every 3 years, or they may recommend the Pap and pelvic exam, combined with testing for human papilloma virus (HPV), every 5 years. Some types of HPV increase your risk of cervical cancer. Testing for HPV may also be done on women of any age with unclear Pap test results.  Other health care providers may not recommend any screening for nonpregnant women who are considered low risk for pelvic cancer and who do not have symptoms. Ask your health care provider if a screening pelvic exam is right for you.  If you have had past treatment for cervical cancer or a condition that could lead to cancer, you need Pap tests and screening for cancer for at least 20 years after your treatment. If Pap tests have been discontinued, your risk factors (such as having a new sexual partner) need to be reassessed to determine if screening should resume. Some women have medical problems that increase the chance of getting cervical cancer. In these cases, your health care provider may recommend more frequent screening and Pap tests.  Colorectal Cancer  This type of cancer can be detected and often prevented.  Routine colorectal cancer screening usually begins at 73 years of age and continues through 73 years of age.  Your health care provider may recommend screening at an earlier age if you have risk factors for colon cancer.  Your health care provider may also recommend using home test kits to  check for hidden blood in the stool.  A small camera at the end of a tube can be used to examine your colon directly (sigmoidoscopy or colonoscopy). This is done to check for the earliest forms of colorectal cancer.  Routine screening usually begins at age 45.  Direct examination of the colon should be repeated every 5-10 years through 73 years of age. However, you may need to be screened more often if early forms of precancerous polyps or small growths are found.  Skin Cancer  Check your skin from head to toe regularly.  Tell your health care provider about any new moles or changes in moles, especially if there is a change in a mole's shape or color.  Also tell your health care provider if you have a mole that is larger than the size of a pencil eraser.  Always use sunscreen. Apply sunscreen liberally and repeatedly throughout the day.  Protect yourself by wearing long sleeves, pants, a wide-brimmed hat, and sunglasses whenever you are outside.  Heart disease, diabetes, and  high blood pressure  High blood pressure causes heart disease and increases the risk of stroke. High blood pressure is more likely to develop in: ? People who have blood pressure in the high end of the normal range (130-139/85-89 mm Hg). ? People who are overweight or obese. ? People who are African American.  If you are 18-39 years of age, have your blood pressure checked every 3-5 years. If you are 40 years of age or older, have your blood pressure checked every year. You should have your blood pressure measured twice-once when you are at a hospital or clinic, and once when you are not at a hospital or clinic. Record the average of the two measurements. To check your blood pressure when you are not at a hospital or clinic, you can use: ? An automated blood pressure machine at a pharmacy. ? A home blood pressure monitor.  If you are between 55 years and 79 years old, ask your health care provider if you should  take aspirin to prevent strokes.  Have regular diabetes screenings. This involves taking a blood sample to check your fasting blood sugar level. ? If you are at a normal weight and have a low risk for diabetes, have this test once every three years after 73 years of age. ? If you are overweight and have a high risk for diabetes, consider being tested at a younger age or more often. Preventing infection Hepatitis B  If you have a higher risk for hepatitis B, you should be screened for this virus. You are considered at high risk for hepatitis B if: ? You were born in a country where hepatitis B is common. Ask your health care provider which countries are considered high risk. ? Your parents were born in a high-risk country, and you have not been immunized against hepatitis B (hepatitis B vaccine). ? You have HIV or AIDS. ? You use needles to inject street drugs. ? You live with someone who has hepatitis B. ? You have had sex with someone who has hepatitis B. ? You get hemodialysis treatment. ? You take certain medicines for conditions, including cancer, organ transplantation, and autoimmune conditions.  Hepatitis C  Blood testing is recommended for: ? Everyone born from 1945 through 1965. ? Anyone with known risk factors for hepatitis C.  Sexually transmitted infections (STIs)  You should be screened for sexually transmitted infections (STIs) including gonorrhea and chlamydia if: ? You are sexually active and are younger than 73 years of age. ? You are older than 73 years of age and your health care provider tells you that you are at risk for this type of infection. ? Your sexual activity has changed since you were last screened and you are at an increased risk for chlamydia or gonorrhea. Ask your health care provider if you are at risk.  If you do not have HIV, but are at risk, it may be recommended that you take a prescription medicine daily to prevent HIV infection. This is called  pre-exposure prophylaxis (PrEP). You are considered at risk if: ? You are sexually active and do not regularly use condoms or know the HIV status of your partner(s). ? You take drugs by injection. ? You are sexually active with a partner who has HIV.  Talk with your health care provider about whether you are at high risk of being infected with HIV. If you choose to begin PrEP, you should first be tested for HIV. You should then be   tested every 3 months for as long as you are taking PrEP. Pregnancy  If you are premenopausal and you may become pregnant, ask your health care provider about preconception counseling.  If you may become pregnant, take 400 to 800 micrograms (mcg) of folic acid every day.  If you want to prevent pregnancy, talk to your health care provider about birth control (contraception). Osteoporosis and menopause  Osteoporosis is a disease in which the bones lose minerals and strength with aging. This can result in serious bone fractures. Your risk for osteoporosis can be identified using a bone density scan.  If you are 65 years of age or older, or if you are at risk for osteoporosis and fractures, ask your health care provider if you should be screened.  Ask your health care provider whether you should take a calcium or vitamin D supplement to lower your risk for osteoporosis.  Menopause may have certain physical symptoms and risks.  Hormone replacement therapy may reduce some of these symptoms and risks. Talk to your health care provider about whether hormone replacement therapy is right for you. Follow these instructions at home:  Schedule regular health, dental, and eye exams.  Stay current with your immunizations.  Do not use any tobacco products including cigarettes, chewing tobacco, or electronic cigarettes.  If you are pregnant, do not drink alcohol.  If you are breastfeeding, limit how much and how often you drink alcohol.  Limit alcohol intake to no more  than 1 drink per day for nonpregnant women. One drink equals 12 ounces of beer, 5 ounces of wine, or 1 ounces of hard liquor.  Do not use street drugs.  Do not share needles.  Ask your health care provider for help if you need support or information about quitting drugs.  Tell your health care provider if you often feel depressed.  Tell your health care provider if you have ever been abused or do not feel safe at home. This information is not intended to replace advice given to you by your health care provider. Make sure you discuss any questions you have with your health care provider. Document Released: 01/06/2011 Document Revised: 11/29/2015 Document Reviewed: 03/27/2015 Elsevier Interactive Patient Education  2018 [employer]  

## 2017-09-23 NOTE — Assessment & Plan Note (Signed)
Chronic problem.  Controlled with diet.  Check labs and start tx prn.

## 2017-09-23 NOTE — Assessment & Plan Note (Signed)
Again reviewed need for daily Protein shakes.  Check LFTs to assess albumin.

## 2017-09-23 NOTE — Assessment & Plan Note (Addendum)
Chronic problem.  Pt has 'failed' every SSRI I have tried her on but admits that she never gives them a chance b/c of her anxiety and fear of side effects.  She says she wants to 'participate in life' but then states that getting up and getting ready 3x/week for PT is 'too much'.  Told her that she is sending mixed messages.  Again, encouraged her to do PT to have some sort of structure in her day.  Will continue to follow.

## 2017-09-23 NOTE — Assessment & Plan Note (Signed)
UTD on DEXA w/ GYN.  Check Vit D level and replete prn.

## 2017-09-23 NOTE — Assessment & Plan Note (Signed)
Chronic problem, following w/ Dr Wynonia Lawman.  On Dilt and Metoprolol for rate control.  Xarelto for anticoagulation.  Currently reg S1/S2.  Will follow along and assist as able

## 2017-09-23 NOTE — Progress Notes (Signed)
   Subjective:    Patient ID: [name], female    DOB: 03-20-1946, 72 y.o.   MRN: [id number]  HPI Afib- chronic problem, on Metoprolol 25mg  BID and Diltiazem 120mg  daily w/ good rate control.  On Xarelto.  Continues to see Dr Wynonia Lawman, 'everything's good'.  Cards is encouraging her to exercise.    Protein Calorie Malnutrition- weight is stable.  Pt has not been drinking her protein shakes but decided yesterday that she wants to commit to 1 or 2 protein shakes daily.  She has been trying to eat 'a lot of chicken'.  She continues to complain of fatigue.  Chronic pain- pt is seeing pain management and continues to take Oxycodone.  Pain management is recommending physical therapy to improve pain and stamina.  Pt is balking at idea of PT b/c 'it's a chore to get ready'.  She is sending mixed messages b/c she says she wants to be able to be up and about and 'participate in life' but then she doesn't want to get up and get ready.  Hyperlipidemia- pt is attempting to control w/ diet.     Review of Systems For ROS see HPI     Objective:   Physical Exam  Constitutional: She is oriented to person, place, and time. She appears well-developed and well-nourished. No distress.  HENT:  Head: Normocephalic and atraumatic.  Eyes: Conjunctivae and EOM are normal. Pupils are equal, round, and reactive to light.  Neck: Normal range of motion. Neck supple. No thyromegaly present.  Cardiovascular: Normal rate, regular rhythm and intact distal pulses.  Murmur (I/VI SEM) heard. Pulmonary/Chest: Effort normal and breath sounds normal. No respiratory distress.  Abdominal: Soft. She exhibits no distension. There is no tenderness.  Musculoskeletal: She exhibits no edema.  Lymphadenopathy:    She has no cervical adenopathy.  Neurological: She is alert and oriented to person, place, and time.  Skin: Skin is warm and dry.  Psychiatric: Her behavior is normal.  Anxious, tangential thought process  Vitals  reviewed.         Assessment & Plan:

## 2017-09-24 LAB — LIPID PANEL
Cholesterol: 237 mg/dL — ABNORMAL HIGH (ref 0–200)
HDL: 100.9 mg/dL (ref >39.00)
LDL Cholesterol: 116 mg/dL — ABNORMAL HIGH (ref 0–99)
NonHDL: 136.08
Total CHOL/HDL Ratio: 2
Triglycerides: 101 mg/dL (ref 0.0–149.0)
VLDL: 20.2 mg/dL (ref 0.0–40.0)

## 2017-09-24 LAB — BASIC METABOLIC PANEL
BUN: 24 mg/dL — ABNORMAL HIGH (ref 6–23)
CO2: 29 mEq/L (ref 19–32)
Calcium: 10.1 mg/dL (ref 8.4–10.5)
Chloride: 105 mEq/L (ref 96–112)
Creatinine, Ser: 0.69 mg/dL (ref 0.40–1.20)
GFR: 88.6 mL/min (ref >60.00)
Glucose, Bld: 103 mg/dL — ABNORMAL HIGH (ref 70–99)
Potassium: 4.2 mEq/L (ref 3.5–5.1)
Sodium: 143 mEq/L (ref 135–145)

## 2017-09-24 LAB — HEPATIC FUNCTION PANEL
ALT: 10 U/L (ref 0–35)
AST: 20 U/L (ref 0–37)
Albumin: 4.7 g/dL (ref 3.5–5.2)
Alkaline Phosphatase: 76 U/L (ref 39–117)
Bilirubin, Direct: 0.1 mg/dL (ref 0.0–0.3)
Total Bilirubin: 0.3 mg/dL (ref 0.2–1.2)
Total Protein: 7 g/dL (ref 6.0–8.3)

## 2017-09-25 ENCOUNTER — Other Ambulatory Visit: Payer: Medicare Other

## 2017-09-25 ENCOUNTER — Other Ambulatory Visit: Payer: Self-pay

## 2017-09-25 MED ORDER — VITAMIN D (ERGOCALCIFEROL) 1.25 MG (50000 UNIT) PO CAPS: 50000.0000 [IU] | ORAL_CAPSULE | ORAL | 0 refills | Status: DC

## 2017-09-28 DIAGNOSIS — G894 Chronic pain syndrome: Secondary | ICD-10-CM | POA: Diagnosis not present

## 2017-09-28 DIAGNOSIS — M961 Postlaminectomy syndrome, not elsewhere classified: Secondary | ICD-10-CM | POA: Diagnosis not present

## 2017-09-28 DIAGNOSIS — Z79891 Long term (current) use of opiate analgesic: Secondary | ICD-10-CM | POA: Diagnosis not present

## 2017-09-28 DIAGNOSIS — M4693 Unspecified inflammatory spondylopathy, cervicothoracic region: Secondary | ICD-10-CM | POA: Diagnosis not present

## 2017-10-05 DIAGNOSIS — F411 Generalized anxiety disorder: Secondary | ICD-10-CM | POA: Diagnosis not present

## 2017-10-05 DIAGNOSIS — K219 Gastro-esophageal reflux disease without esophagitis: Secondary | ICD-10-CM | POA: Diagnosis not present

## 2017-10-05 DIAGNOSIS — M810 Age-related osteoporosis without current pathological fracture: Secondary | ICD-10-CM | POA: Diagnosis not present

## 2017-10-05 DIAGNOSIS — I480 Paroxysmal atrial fibrillation: Secondary | ICD-10-CM | POA: Diagnosis not present

## 2017-10-05 DIAGNOSIS — G894 Chronic pain syndrome: Secondary | ICD-10-CM | POA: Diagnosis not present

## 2017-10-05 DIAGNOSIS — F4323 Adjustment disorder with mixed anxiety and depressed mood: Secondary | ICD-10-CM | POA: Diagnosis not present

## 2017-10-05 DIAGNOSIS — Z981 Arthrodesis status: Secondary | ICD-10-CM | POA: Diagnosis not present

## 2017-10-05 DIAGNOSIS — N301 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: Secondary | ICD-10-CM | POA: Diagnosis not present

## 2017-10-08 DIAGNOSIS — B078 Other viral warts: Secondary | ICD-10-CM | POA: Diagnosis not present

## 2017-10-08 DIAGNOSIS — L308 Other specified dermatitis: Secondary | ICD-10-CM | POA: Diagnosis not present

## 2017-10-08 DIAGNOSIS — L304 Erythema intertrigo: Secondary | ICD-10-CM | POA: Diagnosis not present

## 2017-10-08 DIAGNOSIS — Z85820 Personal history of malignant melanoma of skin: Secondary | ICD-10-CM | POA: Diagnosis not present

## 2017-10-08 DIAGNOSIS — L821 Other seborrheic keratosis: Secondary | ICD-10-CM | POA: Diagnosis not present

## 2017-10-12 ENCOUNTER — Ambulatory Visit (INDEPENDENT_AMBULATORY_CARE_PROVIDER_SITE_OTHER): Payer: Medicare Other | Admitting: Obstetrics & Gynecology

## 2017-10-12 ENCOUNTER — Encounter: Payer: Self-pay | Admitting: Obstetrics & Gynecology

## 2017-10-12 ENCOUNTER — Telehealth: Payer: Self-pay | Admitting: *Deleted

## 2017-10-12 VITALS — BP 130/80

## 2017-10-12 DIAGNOSIS — N644 Mastodynia: Secondary | ICD-10-CM

## 2017-10-12 DIAGNOSIS — N6310 Unspecified lump in the right breast, unspecified quadrant: Secondary | ICD-10-CM | POA: Diagnosis not present

## 2017-10-12 NOTE — Patient Instructions (Signed)
1. Lump of right breast Tender ridge of the right lower inner quadrant of the breast.  Appears benign on exam, but will further investigate with a right diagnostic mammogram and ultrasound with Dr. Isaiah Blakes.  2. Pain of right breast As above.  [name], it was a pleasure meeting you today!  You will receive a phone call shortly to organize a right diagnostic mammogram and ultrasound.  I will see you soon for your annual gynecologic exam.

## 2017-10-12 NOTE — Telephone Encounter (Signed)
Pt scheduled on 10/15/17 @ 1:30pm at [facility], order faxed [phone], pt informed.

## 2017-10-12 NOTE — Progress Notes (Signed)
    [name] 1946/01/09 [id number]        72 y.o.  G1P1001   RP: Rt breast tender ridge at lower inner quadrant  HPI: Patient presented in August 2018 for bilateral breast pain.  She has a breast exam with Dr. Toney Rakes and bilateral diagnostic 3D mammography with Dr. Isaiah Blakes.  The bilateral diagnostic mammography August 2018 were benign.  Since then, patient reports having breast tenderness on and off.  She is being treated for fungal infections below both breasts by her dermatologist.  She noticed a tender ridge at the lower inner aspect of her right breast recently for which she is presenting today.  No skin changes otherwise or nipple discharge.   OB History  Gravida Para Term Preterm AB Living  1 1 1     1   SAB TAB Ectopic Multiple Live Births          1    # Outcome Date GA Lbr Len/2nd Weight Sex Delivery Anes PTL Lv  1 Term     M Vag-Spont  N LIV    Past medical history,surgical history, problem list, medications, allergies, family history and social history were all reviewed and documented in the EPIC chart.   Directed ROS with pertinent positives and negatives documented in the history of present illness/assessment and plan.  Exam:  Vitals:   10/12/17 1008  BP: 130/80   General appearance:  Normal  Breast exam: Left breast with no nodule or mass.  No tenderness.  Skin normal.  No nipple discharge.  No left axillary node felt.  Right breast with no nodule or mass.  Increase in density in a ridge shape at the inner lower right breast which is mildly tender (The left breast actually feels somewhat similar, but the ridge is a little smaller and not tender).  No skin change.  No nipple discharge.  No right axillary lymph node felt.   Assessment/Plan:  72 y.o. G1P1001   1. Lump of right breast Tender ridge of the right lower inner quadrant of the breast.  Appears benign on exam, but will further investigate with a right diagnostic mammogram and ultrasound with Dr.  Isaiah Blakes.  2. Pain of right breast As above.  Counseling on above issues and coordination of care more than 50% for 15 minutes.  Princess Bruins MD, 10:15 AM 10/12/2017

## 2017-10-12 NOTE — Telephone Encounter (Signed)
-----   Message from Princess Bruins, MD sent at 10/12/2017 10:26 AM EDT ----- Regarding: Refer for Rt Dx mammo/US Rt breast tender ridge at lower inner quadrant.  Please schedule with Dr Isaiah Blakes herself.

## 2017-10-14 DIAGNOSIS — M5412 Radiculopathy, cervical region: Secondary | ICD-10-CM | POA: Diagnosis not present

## 2017-10-14 DIAGNOSIS — G5603 Carpal tunnel syndrome, bilateral upper limbs: Secondary | ICD-10-CM | POA: Diagnosis not present

## 2017-10-14 DIAGNOSIS — G500 Trigeminal neuralgia: Secondary | ICD-10-CM | POA: Diagnosis not present

## 2017-10-14 DIAGNOSIS — M5481 Occipital neuralgia: Secondary | ICD-10-CM | POA: Diagnosis not present

## 2017-10-14 DIAGNOSIS — M5417 Radiculopathy, lumbosacral region: Secondary | ICD-10-CM | POA: Diagnosis not present

## 2017-10-14 DIAGNOSIS — G603 Idiopathic progressive neuropathy: Secondary | ICD-10-CM | POA: Diagnosis not present

## 2017-10-15 DIAGNOSIS — R921 Mammographic calcification found on diagnostic imaging of breast: Secondary | ICD-10-CM | POA: Diagnosis not present

## 2017-10-15 DIAGNOSIS — R922 Inconclusive mammogram: Secondary | ICD-10-CM | POA: Diagnosis not present

## 2017-10-15 DIAGNOSIS — R928 Other abnormal and inconclusive findings on diagnostic imaging of breast: Secondary | ICD-10-CM | POA: Diagnosis not present

## 2017-10-28 DIAGNOSIS — G894 Chronic pain syndrome: Secondary | ICD-10-CM | POA: Diagnosis not present

## 2017-10-28 DIAGNOSIS — M961 Postlaminectomy syndrome, not elsewhere classified: Secondary | ICD-10-CM | POA: Diagnosis not present

## 2017-10-28 DIAGNOSIS — M4693 Unspecified inflammatory spondylopathy, cervicothoracic region: Secondary | ICD-10-CM | POA: Diagnosis not present

## 2017-11-04 DIAGNOSIS — M961 Postlaminectomy syndrome, not elsewhere classified: Secondary | ICD-10-CM | POA: Diagnosis not present

## 2017-11-04 DIAGNOSIS — G894 Chronic pain syndrome: Secondary | ICD-10-CM | POA: Diagnosis not present

## 2017-11-04 DIAGNOSIS — M4693 Unspecified inflammatory spondylopathy, cervicothoracic region: Secondary | ICD-10-CM | POA: Diagnosis not present

## 2017-11-05 DIAGNOSIS — M961 Postlaminectomy syndrome, not elsewhere classified: Secondary | ICD-10-CM | POA: Diagnosis not present

## 2017-11-05 DIAGNOSIS — G894 Chronic pain syndrome: Secondary | ICD-10-CM | POA: Diagnosis not present

## 2017-11-05 DIAGNOSIS — M4693 Unspecified inflammatory spondylopathy, cervicothoracic region: Secondary | ICD-10-CM | POA: Diagnosis not present

## 2017-11-10 DIAGNOSIS — M961 Postlaminectomy syndrome, not elsewhere classified: Secondary | ICD-10-CM | POA: Diagnosis not present

## 2017-11-10 DIAGNOSIS — G894 Chronic pain syndrome: Secondary | ICD-10-CM | POA: Diagnosis not present

## 2017-11-10 DIAGNOSIS — M4693 Unspecified inflammatory spondylopathy, cervicothoracic region: Secondary | ICD-10-CM | POA: Diagnosis not present

## 2017-11-19 DIAGNOSIS — M961 Postlaminectomy syndrome, not elsewhere classified: Secondary | ICD-10-CM | POA: Diagnosis not present

## 2017-11-19 DIAGNOSIS — G894 Chronic pain syndrome: Secondary | ICD-10-CM | POA: Diagnosis not present

## 2017-11-19 DIAGNOSIS — M4693 Unspecified inflammatory spondylopathy, cervicothoracic region: Secondary | ICD-10-CM | POA: Diagnosis not present

## 2017-11-23 DIAGNOSIS — G894 Chronic pain syndrome: Secondary | ICD-10-CM | POA: Diagnosis not present

## 2017-11-23 DIAGNOSIS — Z79891 Long term (current) use of opiate analgesic: Secondary | ICD-10-CM | POA: Diagnosis not present

## 2017-11-23 DIAGNOSIS — M4693 Unspecified inflammatory spondylopathy, cervicothoracic region: Secondary | ICD-10-CM | POA: Diagnosis not present

## 2017-11-23 DIAGNOSIS — M961 Postlaminectomy syndrome, not elsewhere classified: Secondary | ICD-10-CM | POA: Diagnosis not present

## 2017-11-24 ENCOUNTER — Other Ambulatory Visit: Payer: Self-pay

## 2017-11-24 ENCOUNTER — Encounter: Payer: Self-pay | Admitting: Family Medicine

## 2017-11-24 ENCOUNTER — Ambulatory Visit (INDEPENDENT_AMBULATORY_CARE_PROVIDER_SITE_OTHER): Payer: Medicare Other | Admitting: Family Medicine

## 2017-11-24 VITALS — BP 110/72 | HR 78 | Temp 98.0°F | Resp 16 | Ht 68.0 in | Wt 123.5 lb

## 2017-11-24 DIAGNOSIS — F419 Anxiety disorder, unspecified: Secondary | ICD-10-CM

## 2017-11-24 DIAGNOSIS — F32A Depression, unspecified: Secondary | ICD-10-CM

## 2017-11-24 DIAGNOSIS — F329 Major depressive disorder, single episode, unspecified: Secondary | ICD-10-CM

## 2017-11-24 DIAGNOSIS — L03116 Cellulitis of left lower limb: Secondary | ICD-10-CM | POA: Diagnosis not present

## 2017-11-24 MED ORDER — DOXYCYCLINE HYCLATE 100 MG PO TABS: 100.0000 mg | ORAL_TABLET | Freq: Two times a day (BID) | ORAL | 0 refills | Status: DC

## 2017-11-24 MED ORDER — DULOXETINE HCL 20 MG PO CPEP: 20.0000 mg | ORAL_CAPSULE | Freq: Every day | ORAL | 3 refills | Status: DC

## 2017-11-24 NOTE — Patient Instructions (Addendum)
Follow up in 6 weeks to recheck anxiety START the Doxycycline as directed- twice daily w/ food Apply warm compresses START the Cymbalta once daily in the AM- start after you finish the Doxycycline Call with any questions or concerns Hang in there!!!

## 2017-11-24 NOTE — Assessment & Plan Note (Signed)
Deteriorated.  Pt's anxiety is worse than usual.  She admits that she has not given the previous meds a fair shot.  I asking to try Cymbalta for both the mood and the pain indication.  Instructed her not to start this until after she has finished her Doxycycline.  Pt expressed understanding and is in agreement w/ plan.  Will follow closely.

## 2017-11-24 NOTE — Progress Notes (Signed)
   Subjective:    Patient ID: [name], female    DOB: 1945/12/09, 72 y.o.   MRN: [id number]  HPI Leg bump- L leg, posterior thigh, noticed last night, 'I feel a big lump underneath the skin and it's red'.  Pt reports 'extra pain' in L leg recently.  Some itching, TTP.  No drainage that she is aware of.  No known bite or sting but she has been in the yard.  Anxiety- pt feels she needs something for her ongoing anxiety.  She's been 'more on edge' than usual.  She has 'tried and failed so many' meds in the past but she acknowledges that she has not given them a fair shot.  Would like to try a SNRI for both mood and pain.   Review of Systems For ROS see HPI     Objective:   Physical Exam  Constitutional: She is oriented to person, place, and time. She appears well-developed. No distress.  Cardiovascular: Intact distal pulses.  Neurological: She is alert and oriented to person, place, and time.  Skin: Skin is warm and dry. There is erythema (3 inch ring of redness surrounding 0.5" area of induration, TTP).  Psychiatric: She has a normal mood and affect. Her behavior is normal. Thought content normal.  Vitals reviewed.         Assessment & Plan:  Cellulitis- new.  Pt has cellulitis surrounding central area of induration but no abscess or fluid collection to drain.  Start Doxy.  Reviewed supportive care and red flags that should prompt return.  Pt expressed understanding and is in agreement w/ plan.

## 2017-12-01 ENCOUNTER — Telehealth: Payer: Self-pay | Admitting: *Deleted

## 2017-12-01 MED ORDER — NYSTATIN POWD: 0 refills | Status: DC

## 2017-12-01 NOTE — Telephone Encounter (Signed)
Agree, work-in on 5/30th.  Nystatin powder prescription to pharmacy.  Also try warm soaks, then dry well.  Call back if fever.

## 2017-12-01 NOTE — Telephone Encounter (Signed)
Patient had diagnostic mammogram and ultrasound of right breast on 10/15/17 at [place]. She called today c/o burning and pain in right breast and states the right breat appears to be larger than the left breast ( has always been larger) but now seem much bigger. I have placed the [facility] report on your desk to review. Patient what to do next regarding pain/burning in right breast? I offered her to come back in for another breast exam. Please advise

## 2017-12-01 NOTE — Telephone Encounter (Signed)
Patient informed, states she can't come on 12/03/17, she has here post op back appointment. Rx sent for nystatin.

## 2017-12-03 DIAGNOSIS — S32050D Wedge compression fracture of fifth lumbar vertebra, subsequent encounter for fracture with routine healing: Secondary | ICD-10-CM | POA: Diagnosis not present

## 2017-12-03 DIAGNOSIS — Z981 Arthrodesis status: Secondary | ICD-10-CM | POA: Diagnosis not present

## 2017-12-03 DIAGNOSIS — G90523 Complex regional pain syndrome I of lower limb, bilateral: Secondary | ICD-10-CM | POA: Diagnosis not present

## 2017-12-03 DIAGNOSIS — S2231XD Fracture of one rib, right side, subsequent encounter for fracture with routine healing: Secondary | ICD-10-CM | POA: Diagnosis not present

## 2017-12-03 DIAGNOSIS — M8580 Other specified disorders of bone density and structure, unspecified site: Secondary | ICD-10-CM | POA: Diagnosis not present

## 2017-12-03 DIAGNOSIS — M419 Scoliosis, unspecified: Secondary | ICD-10-CM | POA: Diagnosis not present

## 2017-12-03 DIAGNOSIS — S32040D Wedge compression fracture of fourth lumbar vertebra, subsequent encounter for fracture with routine healing: Secondary | ICD-10-CM | POA: Diagnosis not present

## 2017-12-03 DIAGNOSIS — Z4789 Encounter for other orthopedic aftercare: Secondary | ICD-10-CM | POA: Diagnosis not present

## 2017-12-08 ENCOUNTER — Ambulatory Visit (INDEPENDENT_AMBULATORY_CARE_PROVIDER_SITE_OTHER): Payer: Medicare Other | Admitting: Obstetrics & Gynecology

## 2017-12-08 ENCOUNTER — Encounter: Payer: Self-pay | Admitting: Obstetrics & Gynecology

## 2017-12-08 VITALS — BP 124/76

## 2017-12-08 DIAGNOSIS — B372 Candidiasis of skin and nail: Secondary | ICD-10-CM | POA: Diagnosis not present

## 2017-12-08 DIAGNOSIS — N644 Mastodynia: Secondary | ICD-10-CM | POA: Diagnosis not present

## 2017-12-08 MED ORDER — NYSTATIN POWD: 3 refills | Status: DC

## 2017-12-08 NOTE — Progress Notes (Signed)
    [name] 06/06/1946 [id number]        72 y.o.  G1P1001   RP: F/U Right Breast pain/itchiness  HPI:  Felt a Rt lower breast tender ridge and had itching under the Rt breast in 10/2017.  Resolved itchiness under the Rt breast with Nystatin powder.  Dx Rt Mammo/US negative 10/2017.   OB History  Gravida Para Term Preterm AB Living  1 1 1     1   SAB TAB Ectopic Multiple Live Births          1    # Outcome Date GA Lbr Len/2nd Weight Sex Delivery Anes PTL Lv  1 Term     M Vag-Spont  N LIV    Past medical history,surgical history, problem list, medications, allergies, family history and social history were all reviewed and documented in the EPIC chart.   Directed ROS with pertinent positives and negatives documented in the history of present illness/assessment and plan.  Exam:  There were no vitals filed for this visit. General appearance:  Normal  Breast exam: Right and left breast normal.  Resolved erythema and irritation although the right breast.   Assessment/Plan:  72 y.o. G1P1001   1. Yeast infection of the skin Patient is very satisfied with nystatin powder using now as needed although the breasts.  Currently resolved symptoms.  Nystatin powder represcribed.  2. Pain of right breast Right diagnostic mammogram and ultrasound negative in April 2019.  Patient reassured.  Other orders - Nystatin POWD; Apply small amount to affected area.  Counseling on above issues and coordination of care more than 50% for 15 minutes.  Princess Bruins MD, 12:33 PM 12/08/2017

## 2017-12-10 ENCOUNTER — Encounter: Payer: Self-pay | Admitting: Obstetrics & Gynecology

## 2017-12-10 NOTE — Patient Instructions (Signed)
1. Yeast infection of the skin Patient is very satisfied with nystatin powder using now as needed although the breasts.  Currently resolved symptoms.  Nystatin powder represcribed.  2. Pain of right breast Right diagnostic mammogram and ultrasound negative in April 2019.  Patient reassured.  Other orders - Nystatin POWD; Apply small amount to affected area.  Maryann Alar, good seeing you today!

## 2017-12-16 DIAGNOSIS — M542 Cervicalgia: Secondary | ICD-10-CM | POA: Diagnosis not present

## 2017-12-16 DIAGNOSIS — G5603 Carpal tunnel syndrome, bilateral upper limbs: Secondary | ICD-10-CM | POA: Diagnosis not present

## 2017-12-16 DIAGNOSIS — M5481 Occipital neuralgia: Secondary | ICD-10-CM | POA: Diagnosis not present

## 2017-12-16 DIAGNOSIS — M5416 Radiculopathy, lumbar region: Secondary | ICD-10-CM | POA: Diagnosis not present

## 2017-12-16 DIAGNOSIS — R270 Ataxia, unspecified: Secondary | ICD-10-CM | POA: Diagnosis not present

## 2017-12-21 ENCOUNTER — Ambulatory Visit (INDEPENDENT_AMBULATORY_CARE_PROVIDER_SITE_OTHER): Payer: Medicare Other | Admitting: Podiatry

## 2017-12-21 ENCOUNTER — Encounter: Payer: Self-pay | Admitting: Podiatry

## 2017-12-21 DIAGNOSIS — L84 Corns and callosities: Secondary | ICD-10-CM | POA: Diagnosis not present

## 2017-12-21 DIAGNOSIS — D689 Coagulation defect, unspecified: Secondary | ICD-10-CM

## 2017-12-21 DIAGNOSIS — M7751 Other enthesopathy of right foot: Secondary | ICD-10-CM

## 2017-12-21 DIAGNOSIS — M779 Enthesopathy, unspecified: Secondary | ICD-10-CM

## 2017-12-21 MED ORDER — TRIAMCINOLONE ACETONIDE 10 MG/ML IJ SUSP
10.0000 mg | Freq: Once | INTRAMUSCULAR | Status: AC
  Administered 2017-12-21: 10 mg

## 2017-12-22 ENCOUNTER — Other Ambulatory Visit: Payer: Self-pay

## 2017-12-22 ENCOUNTER — Encounter: Payer: Self-pay | Admitting: Family Medicine

## 2017-12-22 ENCOUNTER — Ambulatory Visit (INDEPENDENT_AMBULATORY_CARE_PROVIDER_SITE_OTHER): Payer: Medicare Other | Admitting: Family Medicine

## 2017-12-22 VITALS — BP 122/80 | HR 75 | Temp 98.0°F | Resp 15 | Ht 68.0 in | Wt 122.4 lb

## 2017-12-22 DIAGNOSIS — N301 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: Secondary | ICD-10-CM | POA: Diagnosis not present

## 2017-12-22 DIAGNOSIS — R399 Unspecified symptoms and signs involving the genitourinary system: Secondary | ICD-10-CM

## 2017-12-22 LAB — POCT URINALYSIS DIPSTICK
Bilirubin, UA: NEGATIVE
Glucose, UA: NEGATIVE
Ketones, UA: NEGATIVE
Leukocytes, UA: NEGATIVE
Nitrite, UA: NEGATIVE
Protein, UA: NEGATIVE
Spec Grav, UA: >=1.03 — AB (ref 1.010–1.025)
Urobilinogen, UA: 0.2 E.U./dL
pH, UA: 6 (ref 5.0–8.0)

## 2017-12-22 NOTE — Progress Notes (Signed)
Subjective   CC:  Chief Complaint  Patient presents with  . Urinary Frequency    Started a few days ago, has had multiple UTI's and Kidney stones    HPI: [name] is a 72 y.o. female who presents to the office today to address the problems listed above in the chief complaint.  Patient reports 2 day history of mild increase in pelvic pressure and urinary frequency w/o dysuria or gross hematuria. Has history of interstitial cystitis.  She denies vaginal symptoms including vaginal discharge or pelvic pain. No associated fevers , n, v, or flank pain. Today she is feeling better.   Assessment  1. Interstitial cystitis   2. UTI symptoms      Plan   Urine dip is dry but no sign of infection. Suspect mild IC flare. Push fluids, cranberry. Seems to be improving. F/u if doesn't.   Follow up: No follow-ups on file.  Orders Placed This Encounter  Procedures  . POCT Urinalysis Dipstick   No orders of the defined types were placed in this encounter.     I reviewed the patients updated PMH, FH, and SocHx.    Patient Active Problem List   Diagnosis Date Noted  . Osteoporosis 09/23/2017  . Fatigue 02/18/2017  . Physical exam 04/21/2016  . Anxiety and depression 02/13/2016  . Chronic pain 02/13/2016  . Vertigo 11/16/2015  . B12 deficiency 10/01/2015  . Hearing loss due to cerumen impaction 08/23/2015  . Foot pain, right 05/23/2015  . Hyperlipidemia 04/19/2015  . Protein-calorie malnutrition ([place]) 10/19/2014  . Fracture of multiple ribs 08/07/2014  . Paroxysmal atrial fibrillation (HCC)   . Tachycardia   . Anemia 08/06/2014  . Constipation 07/14/2014  . Lumbar back pain 05/19/2012  . Seasonal allergic rhinitis 11/06/2011  . S/P cervical spinal fusion 11/06/2011  . GERD (gastroesophageal reflux disease) 11/06/2011   Current Meds  Medication Sig  . acetaminophen (TYLENOL) 500 MG tablet Take 1,000 mg by mouth every 6 (six) hours as needed for mild pain.   . Calcium-Vitamin  D-Vitamin K (VIACTIV PO) Take by mouth.  . diltiazem (CARDIZEM CD) 120 MG 24 hr capsule Take 1 capsule by mouth daily.  . meclizine (ANTIVERT) 25 MG tablet Take 1 tablet (25 mg total) by mouth 3 (three) times daily as needed for dizziness.  . metoprolol tartrate (LOPRESSOR) 25 MG tablet Take 1 tablet (25 mg total) by mouth 2 (two) times daily.  [name] Nystatin POWD Apply small amount to affected area.  . ondansetron (ZOFRAN) 4 MG tablet Take 1 tablet (4 mg total) by mouth every 8 (eight) hours as needed for nausea or vomiting.  [name] oxyCODONE (ROXICODONE) 5 MG/5ML solution 5-10 mls every 4-6 hours  . polyethylene glycol powder (GLYCOLAX/MIRALAX) powder   . rivaroxaban (XARELTO) 20 MG TABS tablet Take 1 tablet by mouth daily.  . valACYclovir (VALTREX) 500 MG tablet as needed.     Review of Systems: Cardiovascular: negative for chest pain Respiratory: negative for SOB or persistent cough Gastrointestinal: negative for abdominal pain Constitutional: Negative for fever malaise or anorexia  Objective  Vitals: BP 122/80   Pulse 75   Temp 98 F (36.7 C) (Oral)   Resp 15   Ht 5\' 8"  (1.727 m)   Wt 122 lb 6.4 oz (55.5 kg)   SpO2 95%   BMI 18.61 kg/m  General: no acute distress  Psych:  Alert and oriented, normal mood and affect Gastrointestinal: soft, flat abdomen,no palpable masses,NO CVAT, no suprapubic ttp  w/o rebound or guarding Skin:  Warm, no rashes Neurologic:   Mental status is normal. normal gait Office Visit on 12/22/2017  Component Date Value Ref Range Status  . Color, UA 12/22/2017 yellow   Final  . Clarity, UA 12/22/2017 clear   Final  . Glucose, UA 12/22/2017 Negative  Negative Final  . Bilirubin, UA 12/22/2017 negative   Final  . Ketones, UA 12/22/2017 negative   Final  . Spec Grav, UA 12/22/2017 >=1.030* 1.010 - 1.025 Final  . Blood, UA 12/22/2017 Trace   Final  . pH, UA 12/22/2017 6.0  5.0 - 8.0 Final  . Protein, UA 12/22/2017 Negative  Negative Final  . Urobilinogen, UA  12/22/2017 0.2  0.2 or 1.0 E.U./dL Final  . Nitrite, UA 12/22/2017 negative   Final  . Leukocytes, UA 12/22/2017 Negative  Negative Final    Commons side effects, risks, benefits, and alternatives for medications and treatment plan prescribed today were discussed, and the patient expressed understanding of the given instructions. Patient is instructed to call or message via MyChart if he/she has any questions or concerns regarding our treatment plan. No barriers to understanding were identified. We discussed Red Flag symptoms and signs in detail. Patient expressed understanding regarding what to do in case of urgent or emergency type symptoms.   Medication list was reconciled, printed and provided to the patient in AVS. Patient instructions and summary information was reviewed with the patient as documented in the AVS. This note was prepared with assistance of Dragon voice recognition software. Occasional wrong-word or sound-a-like substitutions may have occurred due to the inherent limitations of voice recognition software

## 2017-12-22 NOTE — Patient Instructions (Signed)
Please follow up if symptoms do not improve or as needed.   Drink plenty of water and start drinking cranberry juice or AZO cranberry supplements. You should do fine. Let us know if things change.

## 2017-12-23 NOTE — Progress Notes (Signed)
Subjective:   Patient ID: [name], female   DOB: 72 y.o.   MRN: [id number]   HPI Patient presents with painful lesions plantar aspect both feet that she cannot walk on and has fluid buildup around the fifth MPJ right that is become increasingly tender.  Patient states that it makes it hard to walk at times   ROS      Objective:  Physical Exam  Neurovascular status intact with inflammation fluid around the fifth MPJ right with keratotic lesion sub-fifth metatarsal bilateral with patient who is currently on blood thinner and cannot trim these herself     Assessment:  Inflammatory capsulitis plantar fifth MPJ right with keratotic lesion of the sub-fifth metatarsal head bilateral     Plan:  Injected the plantar capsule right 3 mg Kenalog 5 mg Xylocaine and did deep debridement of both lesions with no iatrogenic bleeding and will be seen back as needed and did discuss possibility of orthotics reviewed a softer type shoe material for her to use at this time

## 2017-12-25 ENCOUNTER — Telehealth: Payer: Self-pay | Admitting: Podiatry

## 2017-12-25 NOTE — Telephone Encounter (Signed)
Left message informing pt 1/4 cup epsom salt in 1 qt of warm water for 20 minutes a day or 2 should help with the tenderness. I informed pt the calloused skin presses on the soft uncalloused skin beneath, making the areas tender, then when the callous is shaved down the skin beneath is still tender and may need a day or two to desensitize, to wear padded bottomed shoes would help.

## 2017-12-25 NOTE — Telephone Encounter (Signed)
I'm a pt of Dr. Mellody Drown and I saw him this past Monday, 17 June. I come in periodically for my feet for things like calluses. He removed a place on Monday. I wanted to know if it would be okay to soak my foot in water or soapy water or whatever? For some reason, the foot is a whole lot more tinder and sore this time after the procedure. I did walk on it more and the bandage came off earlier so I put another bandage on. I don't know if that made my foot more tinder or not. If you could give me a call back at [phone]. Thank you so much and have a great day.

## 2017-12-31 ENCOUNTER — Telehealth: Payer: Self-pay | Admitting: Family Medicine

## 2017-12-31 NOTE — Telephone Encounter (Signed)
Copied from [place] [id number]. Topic: Quick Communication - See Telephone Encounter >> Dec 31, 2017 12:17 PM Vernona Rieger wrote: CRM for notification. See Telephone encounter for: 12/31/17.  Patient would like to know about taking a drug called Amitrittyline with the condition that she has ( Afib ). She wants to see if this will work for her anxiety. Please advise. She said that two different pharmacists told her not to take it, she wants Dr Virgil Benedict opinion.

## 2017-12-31 NOTE — Telephone Encounter (Signed)
Please advise 

## 2017-12-31 NOTE — Telephone Encounter (Signed)
Pt made aware and stated an understanding. She will call cardiology to discuss.

## 2017-12-31 NOTE — Telephone Encounter (Signed)
Pt needs to ask Cardiology prior to taking this

## 2018-01-04 ENCOUNTER — Telehealth: Payer: Self-pay | Admitting: Podiatry

## 2018-01-04 NOTE — Telephone Encounter (Signed)
Patient says she has been soaking as recommended and its not helping, still very sore. I recommended she get some type of padding for the ball of her foot to help cushion. She wanted to know if we had something here and wanted to come by and get something from [place] instead of looking for padding in the pharmacy. I told her we did have some metatarsal gel cushions she could try. She said that she may be by today, if not tomorrow.

## 2018-01-04 NOTE — Telephone Encounter (Signed)
I saw Dr. Paulla Dolly on 17 June for a callus. I called back one time and I did what you told me to do about soaking my foot. It is still very painful walking, especially walking barefoot. If I no shoes on I can hardly walk on that right foot. I wanted to call back to see if there is anything else I can do. I would appreciate a call back at [phone]. Thank you.

## 2018-01-05 ENCOUNTER — Ambulatory Visit: Payer: Medicare Other | Admitting: Family Medicine

## 2018-01-08 DIAGNOSIS — M4693 Unspecified inflammatory spondylopathy, cervicothoracic region: Secondary | ICD-10-CM | POA: Diagnosis not present

## 2018-01-08 DIAGNOSIS — G894 Chronic pain syndrome: Secondary | ICD-10-CM | POA: Diagnosis not present

## 2018-01-08 DIAGNOSIS — M961 Postlaminectomy syndrome, not elsewhere classified: Secondary | ICD-10-CM | POA: Diagnosis not present

## 2018-01-14 ENCOUNTER — Telehealth: Payer: Self-pay | Admitting: Podiatry

## 2018-01-14 NOTE — Telephone Encounter (Signed)
Pt states she is having more pain on the bottom of the foot at the little toe, after shaving the callous this time, soaks and padding have not helped. I offered pt an appt, and transferred to schedulers.

## 2018-01-14 NOTE — Telephone Encounter (Signed)
I saw Dr. Paulla Dolly on 17 June and he worked on my foot. I've called a couple of times and I've also come by and pick up some padding to go over the foot. I wanted him to know that I'm still having a lot of problems, I have a lot of pain, in fact I think its worse now than when I came in to see him. I'm wondering if I don't need to have him look at it again due to the fact of not being able to walk on it very well and the pain not seeming to be getting any better. If you could give me a call back at [phone]. Thank you.

## 2018-01-15 ENCOUNTER — Encounter: Payer: Self-pay | Admitting: Podiatry

## 2018-01-15 ENCOUNTER — Ambulatory Visit (INDEPENDENT_AMBULATORY_CARE_PROVIDER_SITE_OTHER): Payer: Medicare Other | Admitting: Podiatry

## 2018-01-15 DIAGNOSIS — M21621 Bunionette of right foot: Secondary | ICD-10-CM

## 2018-01-15 DIAGNOSIS — D689 Coagulation defect, unspecified: Secondary | ICD-10-CM

## 2018-01-15 DIAGNOSIS — L84 Corns and callosities: Secondary | ICD-10-CM | POA: Diagnosis not present

## 2018-01-15 NOTE — Progress Notes (Signed)
Subjective:   Patient ID: [name], female   DOB: 72 y.o.   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  I did careful debridement of tissue with patient being on blood thinner which is a high risk factor.  I then discussed that this may require surgery and I educated her on tailor's bunion deformity and fifth metatarsal head resection which may be necessary

## 2018-02-05 ENCOUNTER — Other Ambulatory Visit: Payer: Self-pay

## 2018-02-05 ENCOUNTER — Ambulatory Visit: Payer: Self-pay | Admitting: *Deleted

## 2018-02-05 ENCOUNTER — Ambulatory Visit (INDEPENDENT_AMBULATORY_CARE_PROVIDER_SITE_OTHER): Payer: Medicare Other | Admitting: Physician Assistant

## 2018-02-05 ENCOUNTER — Encounter: Payer: Self-pay | Admitting: Physician Assistant

## 2018-02-05 VITALS — BP 104/70 | HR 75 | Temp 98.1°F | Resp 16 | Ht 68.0 in | Wt 125.4 lb

## 2018-02-05 DIAGNOSIS — R229 Localized swelling, mass and lump, unspecified: Secondary | ICD-10-CM

## 2018-02-05 NOTE — Patient Instructions (Addendum)
It is hard to tell exactly what the issues is -- there could be a small spider bite but I see no sign of reaction. There is likely a little hematoma formed underneath the area.  Please keep the area iced and elevated. Tylenol for pain if needed. Keep a close eye on the area. If you notice any redness, hardness or increased tenderness, or development of fever, please be seen at [facility] or ER this weekend for treatment.    Spider Bite Spider bites are not common. Most spider bites do not cause serious problems. There are only a few types of spider bites that can cause serious health problems. Follow these instructions at home: Medicine  Take or apply over-the-counter and prescription medicines only as told by your doctor.  If you were given an antibiotic medicine, take or apply it as told by your doctor. Do not stop using the antibiotic even if your condition improves. General instructions  Do not scratch the bite area.  Keep the bite area clean and dry. Wash the bite area with soap and water every day as told by your doctor.  If directed, apply ice to the bite area. ? Put ice in a plastic bag. ? Place a towel between your skin and the bag. ? Leave the ice on for 20 minutes, 2-3 times per day.  Raise (elevate) the affected area above the level of your heart while you are sitting or lying down, if this is possible.  Keep all follow-up visits as told by your doctor. This is important. Contact a doctor if:  Your bite does not get better after 3 days.  Your bite turns black or purple.  Near the bite, you have: ? Redness. ? Swelling (inflammation). ? Pain that is getting worse. Get help right away if:  You get shortness of breath or chest pain.  You have fluid, blood, or pus coming from the bite area.  You have muscle cramps or painful muscle spasms.  You have stomach (abdominal) pain.  You feel sick to your stomach (nauseous) or you throw up (vomit).  You feel more tired or  sleepy than you normally do. This information is not intended to replace advice given to you by your health care provider. Make sure you discuss any questions you have with your health care provider. Document Released: 07/26/2010 Document Revised: 02/18/2016 Document Reviewed: 11/08/2014 Elsevier Interactive Patient Education  2018 [employer].   Hematoma A hematoma is a collection of blood. The collection of blood can turn into a hard, painful lump under the skin. Your skin may turn blue or yellow if the hematoma is close to the surface of the skin. Most hematomas get better in a few days to weeks. Some hematomas are serious and need medical care. Hematomas can be very small or very big. Follow these instructions at home:  Apply ice to the injured area: ? Put ice in a plastic bag. ? Place a towel between your skin and the bag. ? Leave the ice on for 20 minutes, 2-3 times a day for the first 1 to 2 days.  After the first 2 days, switch to using warm packs on the injured area.  Raise (elevate) the injured area to lessen pain and puffiness (swelling). You may also wrap the area with an elastic bandage. Make sure the bandage is not wrapped too tight.  If you have a painful hematoma on your leg or foot, you may use crutches for a couple days.  Only  take medicines as told by your doctor. Get help right away if:  Your pain gets worse.  Your pain is not controlled with medicine.  You have a fever.  Your puffiness gets worse.  Your skin turns more blue or yellow.  Your skin over the hematoma breaks or starts bleeding.  Your hematoma is in your chest or belly (abdomen) and you are short of breath, feel weak, or have a change in consciousness.  Your hematoma is on your scalp and you have a headache that gets worse or a change in alertness or consciousness. This information is not intended to replace advice given to you by your health care provider. Make sure you discuss any questions  you have with your health care provider. Document Released: 07/31/2004 Document Revised: 11/29/2015 Document Reviewed: 12/01/2012 Elsevier Interactive Patient Education  2017 [employer].

## 2018-02-05 NOTE — Telephone Encounter (Signed)
FYI

## 2018-02-05 NOTE — Progress Notes (Signed)
Patient presents to clinic today c/o bump underneath the skin of her R forearm, noted yesterday that is somewhat tender to touch. Denies fever, chills, malaise or fatigue. Denies generalized arm swelling. Denies known trauma or injury. Is concerned about spider bite but does not recall history of sting or bite. Swelling is improved today compared to yesterday per patient.   Past Medical History:  Diagnosis Date  . Allergy   . Anxiety   . Benign paroxysmal positional vertigo 06/08/2013  . Chronic a-fib ([place]) 08/07/2014  . Fracture of multiple ribs   . GERD (gastroesophageal reflux disease)   . Hair loss 04/12/2012  . Herpes   . IC (intermittent claudication) ([place])   . Interstitial cystitis 11/06/2011  . Kidney stone   . 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 Osteopenia 02/18/2012  . Osteoporosis   . PAF (paroxysmal atrial fibrillation) ([place]) 2012  . Peripheral neuropathy 11/06/2011  . PMDD (premenstrual dysphoric disorder)   . Rotator cuff tear   . Vaginal delivery    ONE NSVD  . 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 acetaminophen (TYLENOL) 500 MG tablet Take 1,000 mg by mouth every 6 (six) hours as needed for mild pain.     . Calcium-Vitamin D-Vitamin K (VIACTIV PO) Take by mouth.    . cyclobenzaprine (FLEXERIL) 10 MG tablet TAKE 1 TABLET BY MOUTH THREE TIMES DAILY AS NEEDED FOR UP TO 10 DAYS FOR MUSCLE SPASM  5  . diltiazem (CARDIZEM CD) 120 MG 24 hr capsule Take 1 capsule by mouth daily.    . meclizine (ANTIVERT) 25 MG tablet Take 1 tablet (25 mg total) by mouth 3 (three) times daily as needed for dizziness. 45 tablet 0  . metoprolol tartrate (LOPRESSOR) 25 MG tablet Take 1 tablet (25 mg total) by mouth 2 (two) times daily. 60 tablet 11  . Nystatin POWD Apply small amount to affected area. 1 Bottle 3  . ondansetron (ZOFRAN) 4 MG tablet Take 1 tablet (4 mg total) by mouth every 8 (eight) hours as needed for nausea or  vomiting. 30 tablet 0  . oxyCODONE (ROXICODONE) 5 MG/5ML solution 5-10 mls every 4-6 hours    . polyethylene glycol powder (GLYCOLAX/MIRALAX) powder     . rivaroxaban (XARELTO) 20 MG TABS tablet Take 1 tablet by mouth daily.    . Teriparatide, Recombinant, (FORTEO) 600 MCG/2.4ML SOLN INJECT 0.08ML (20MCG) UNDER THE SKIN ONCE DAILY    . valACYclovir (VALTREX) 500 MG tablet as needed.     [name] amitriptyline (ELAVIL) 25 MG tablet TK 1 TO 2 TS PO QHS  6  . DULoxetine (CYMBALTA) 20 MG capsule Take 1 capsule (20 mg total) by mouth daily. (Patient not taking: Reported on 02/05/2018) 30 capsule 3  . [DISCONTINUED] sertraline (ZOLOFT) 50 MG tablet Take 1 tablet (50 mg total) by mouth daily. 30 tablet 1   No current facility-administered medications on file prior to visit.     Allergies  Allergen Reactions  . Contrast Media [Iodinated Diagnostic Agents] Hives    unknown  . Cymbalta [Duloxetine Hcl]     Severe diarrhea and upset stomach  . Erythromycin     unknown  . Latex     hives  . Levofloxacin Nausea And Vomiting  . Other     IV CONTRAST /"DYE".  . Oxycodone     Other reaction(s): Delusions (intolerance)  . Septra [Bactrim]     Hives   .  Sulfa Antibiotics     rash  . Morphine Anxiety    Family History  Problem Relation Age of Onset  . Diabetes Father   . Hyperlipidemia Sister   . Heart disease Sister   . Stroke Sister   . Diabetes Brother   . Hyperlipidemia Sister   . Heart disease Sister   . Arthritis Mother   . Cancer Mother        uterus  . Heart disease Mother   . Diabetes Brother   . Heart Problems Brother     Social History   Socioeconomic History  . Marital status: Divorced    Spouse name: Not on file  . Number of children: Not on file  . Years of education: Not on file  . Highest education level: Not on file  Occupational History  . Not on file  Social Needs  . Financial resource strain: Not on file  . Food insecurity:    Worry: Not on file    Inability:  Not on file  . Transportation needs:    Medical: Not on file    Non-medical: Not on file  Tobacco Use  . Smoking status: Former Smoker    Packs/day: 0.25    Years: 14.00    Pack years: 3.50    Types: Cigarettes    Last attempt to quit: 07/07/1984    Years since quitting: 33.6  . Smokeless tobacco: Never Used  Substance and Sexual Activity  . Alcohol use: No    Alcohol/week: 0.0 oz  . Drug use: No  . Sexual activity: Not on file  Lifestyle  . Physical activity:    Days per week: Not on file    Minutes per session: Not on file  . Stress: Not on file  Relationships  . Social connections:    Talks on phone: Not on file    Gets together: Not on file    Attends religious service: Not on file    Active member of club or organization: Not on file    Attends meetings of clubs or organizations: Not on file    Relationship status: Not on file  Other Topics Concern  . Not on file  Social History Narrative  . Not on file   Review of Systems - See HPI.  All other ROS are negative.  BP 104/70   Pulse 75   Temp 98.1 F (36.7 C) (Oral)   Resp 16   Ht 5\' 8"  (1.727 m)   Wt 125 lb 6.4 oz (56.9 kg)   SpO2 95%   BMI 19.07 kg/m   Physical Exam  Constitutional: She appears well-developed and well-nourished.  HENT:  Head: Normocephalic and atraumatic.  Cardiovascular: Normal rate, regular rhythm, normal heart sounds and intact distal pulses.  Pulmonary/Chest: Effort normal.  Skin:     Psychiatric: She has a normal mood and affect.  Vitals reviewed.   Recent Results (from the past 2160 hour(s))  POCT Urinalysis Dipstick     Status: Abnormal   Collection Time: 12/22/17  2:40 PM  Result Value Ref Range   Color, UA yellow    Clarity, UA clear    Glucose, UA Negative Negative   Bilirubin, UA negative    Ketones, UA negative    Spec Grav, UA >=1.030 (A) 1.010 - 1.025   Blood, UA Trace    pH, UA 6.0 5.0 - 8.0   Protein, UA Negative Negative   Urobilinogen, UA 0.2 0.2 or 1.0  E.U./dL  Nitrite, UA negative    Leukocytes, UA Negative Negative   Appearance     Odor      Assessment/Plan: 1. Subcutaneous mass Seems most consistent with a hematoma. She has concern for spider bite. There is no visualized fang marks, erythema or necrosis. Discussed with her that some smaller spiders may bite without noted fang marks but thankfully there is no sign of infection. Recommended monitoring of the area. Ice and elevation recommended. Symptoms should slowly improve and resolve from this point forward. Strict ER precautions reviewed.    Leeanne Rio, PA-C

## 2018-02-05 NOTE — Telephone Encounter (Signed)
Pt has a swollen right arm between the elbow and the wrist. She discovered it last night. She put ice on it and it helped some. She feels like  the swelling has increased and states now about the size of an egg and looks slightly bluish not red. She does not not know if it is a spider or insect bite. She remembers seeing spiders in her home but usually dead.  Appointment scheduled per protocol for this morning. Pt voiced understanding. Will route to [facility] at [facility].  Reason for Disposition . [1] Red area or streak [2] large (> 2 in. or 5 cm)    The area is size of an egg and slightly blue, tender to touch.  Answer Assessment - Initial Assessment Questions 1. ONSET: "When did the swelling start?" (e.g., minutes, hours, days)     Last night 2. LOCATION: "What part of the arm is swollen?"  "Are both arms swollen or just one arm?"     Between the elbow and the wrist on the right arm 3. SEVERITY: "How bad is the swelling?" (e.g., localized; mild, moderate, severe)   - LOCALIZED: Small area of puffiness or swelling on just one arm   - JOINT SWELLING: Swelling of one joint   - MILD: Puffiness or swelling of hand   - MODERATE: Puffiness or swollen feeling of entire arm    - SEVERE: All of arm looks swollen; pitting edema     localized 4. REDNESS: "Does the swelling look red or infected?"     Discolored (bluish) 5. PAIN: "Is the swelling painful to touch?" If so, ask: "How painful is it?"   (Scale 1-10; mild, moderate or severe)     Tender to touch, burning 6. FEVER: "Do you have a fever?" If so, ask: "What is it, how was it measured, and when did it start?"      no 7. CAUSE: "What do you think is causing the arm swelling?"     Not sure, possibly bite from insect 8. MEDICAL HISTORY: "Do you have a history of heart failure, kidney disease, liver failure, or cancer?"     Hx of melanoma 9. RECURRENT SYMPTOM: "Have you had arm swelling before?" If so, ask: "When was the last time?" "What  happened that time?"     no 10. OTHER SYMPTOMS: "Do you have any other symptoms?" (e.g., chest pain, difficulty breathing)       no  Protocols used: ARM SWELLING AND EDEMA-A-AH

## 2018-02-06 ENCOUNTER — Emergency Department (INDEPENDENT_AMBULATORY_CARE_PROVIDER_SITE_OTHER): Payer: Medicare Other

## 2018-02-06 ENCOUNTER — Encounter: Payer: Self-pay | Admitting: Emergency Medicine

## 2018-02-06 ENCOUNTER — Emergency Department (INDEPENDENT_AMBULATORY_CARE_PROVIDER_SITE_OTHER)
Admission: EM | Admit: 2018-02-06 | Discharge: 2018-02-06 | Disposition: A | Discharge status: Home / Self Care | Payer: Medicare Other | Source: Home / Self Care | Attending: Emergency Medicine | Admitting: Emergency Medicine

## 2018-02-06 DIAGNOSIS — M7989 Other specified soft tissue disorders: Secondary | ICD-10-CM | POA: Diagnosis not present

## 2018-02-06 DIAGNOSIS — M79631 Pain in right forearm: Secondary | ICD-10-CM

## 2018-02-06 DIAGNOSIS — S59911A Unspecified injury of right forearm, initial encounter: Secondary | ICD-10-CM | POA: Diagnosis not present

## 2018-02-06 DIAGNOSIS — T148XXA Other injury of unspecified body region, initial encounter: Secondary | ICD-10-CM | POA: Diagnosis not present

## 2018-02-06 NOTE — ED Triage Notes (Signed)
Pt c/o insect bite to right forearm, she noticed it yesterday but states worse today. Painful and swollen.

## 2018-02-06 NOTE — ED Provider Notes (Signed)
[name]    CSN: [id number] Arrival date & time: 02/06/18  1135     History   Chief Complaint No chief complaint on file. Patient enters with a tender swollen area right forearm.  2 days ago she inadvertently struck the side of a door.  The next day she noticed a swollen area over the forearm.  She does not know of a insect bite to her forearm.  Of note she is currently on Xarelto.  Back in  HPI NIDIA GROGAN is a 72 y.o. female.   HPI  Past Medical History:  Diagnosis Date  . Allergy   . Anxiety   . Benign paroxysmal positional vertigo 06/08/2013  . Chronic a-fib ([place]) 08/07/2014  . Fracture of multiple ribs   . GERD (gastroesophageal reflux disease)   . Hair loss 04/12/2012  . Herpes   . IC (intermittent claudication) ([place])   . Interstitial cystitis 11/06/2011  . Kidney stone   . 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 Osteopenia 02/18/2012  . Osteoporosis   . PAF (paroxysmal atrial fibrillation) ([place]) 2012  . Peripheral neuropathy 11/06/2011  . PMDD (premenstrual dysphoric disorder)   . Rotator cuff tear   . Vaginal delivery    ONE NSVD  . 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 Osteoporosis 09/23/2017  . Fatigue 02/18/2017  . Physical exam 04/21/2016  . Anxiety and depression 02/13/2016  . Chronic pain 02/13/2016  . Vertigo 11/16/2015  . B12 deficiency 10/01/2015  . Hearing loss due to cerumen impaction 08/23/2015  . Foot pain, right 05/23/2015  . Hyperlipidemia 04/19/2015  . Protein-calorie malnutrition ([place]) 10/19/2014  . Fracture of multiple ribs 08/07/2014  . Paroxysmal atrial fibrillation ([facility])   . Tachycardia   . Anemia 08/06/2014  . Constipation 07/14/2014  . Lumbar back pain 05/19/2012  . Seasonal allergic rhinitis 11/06/2011  . S/P cervical spinal fusion 11/06/2011  . GERD (gastroesophageal reflux disease) 11/06/2011    Past Surgical History:  Procedure Laterality Date  . BACK SURGERY     DISC FUSION  L5,L6,L7  . BLADDER SURGERY     Bladder sling  . BREAST SURGERY     BREAST BIOPSY--RIGHT BENIGN  . BUNIONECTOMY    . HEAD & NECK SKIN LESION EXCISIONAL BIOPSY     melanoma  . NECK FUSION    . ROTATOR CUFF REPAIR  2012   RIGHT  . SPINAL FUSION  dec 2015   . VAGINAL HYSTERECTOMY     TVH    OB History    Gravida  1   Para  1   Term  1   Preterm      AB      Living  1     SAB      TAB      Ectopic      Multiple      Live Births  1            Home Medications    Prior to Admission medications   Medication Sig Start Date End Date Taking? Authorizing Provider  acetaminophen (TYLENOL) 500 MG tablet Take 1,000 mg by mouth every 6 (six) hours as needed for mild pain.     [provider]  amitriptyline (ELAVIL) 25 MG tablet TK 1 TO 2 TS PO QHS 12/22/17   [provider]  Calcium-Vitamin D-Vitamin K (VIACTIV PO) Take by mouth.    [provider]  cyclobenzaprine (FLEXERIL) 10 MG tablet TAKE 1 TABLET BY MOUTH THREE TIMES DAILY AS NEEDED FOR UP TO 10 DAYS FOR MUSCLE SPASM 12/10/17   [provider]  diltiazem (CARDIZEM CD) 120 MG 24 hr capsule Take 1 capsule by mouth daily. 05/08/16 02/05/18  [provider]  DULoxetine (CYMBALTA) 20 MG capsule Take 1 capsule (20 mg total) by mouth daily. Patient not taking: Reported on 02/05/2018 11/24/17   [name], MD  meclizine (ANTIVERT) 25 MG tablet Take 1 tablet (25 mg total) by mouth 3 (three) times daily as needed for dizziness. 07/09/17   [name], MD  metoprolol tartrate (LOPRESSOR) 25 MG tablet Take 1 tablet (25 mg total) by mouth 2 (two) times daily. 05/13/16   Barrett, Evelene Croon, PA-C  Nystatin POWD Apply small amount to affected area. 12/08/17   Princess Bruins, MD  ondansetron (ZOFRAN) 4 MG tablet Take 1 tablet (4 mg total) by mouth every 8 (eight) hours as needed for nausea or vomiting. 07/09/17   [name], MD  oxyCODONE (ROXICODONE) 5 MG/5ML solution 5-10  mls every 4-6 hours 10/31/15   [provider]  polyethylene glycol powder (GLYCOLAX/MIRALAX) powder  08/22/14   [provider]  rivaroxaban (XARELTO) 20 MG TABS tablet Take 1 tablet by mouth daily. 05/07/16   [provider]  Teriparatide, Recombinant, (FORTEO) 600 MCG/2.4ML SOLN INJECT 0.08ML [facility]) UNDER THE SKIN ONCE DAILY 01/01/18   [provider]  valACYclovir (VALTREX) 500 MG tablet as needed.  04/16/15   [provider]  sertraline (ZOLOFT) 50 MG tablet Take 1 tablet (50 mg total) by mouth daily. 07/22/16 09/01/16  Merian Capron, MD    Family History Family History  Problem Relation Age of Onset  . Diabetes Father   . Hyperlipidemia Sister   . Heart disease Sister   . Stroke Sister   . Diabetes Brother   . Hyperlipidemia Sister   . Heart disease Sister   . Arthritis Mother   . Cancer Mother        uterus  . Heart disease Mother   . Diabetes Brother   . Heart Problems Brother     Social History Social History   Tobacco Use  . Smoking status: Former Smoker    Packs/day: 0.25    Years: 14.00    Pack years: 3.50    Types: Cigarettes    Last attempt to quit: 07/07/1984    Years since quitting: 33.6  . Smokeless tobacco: Never Used  Substance Use Topics  . Alcohol use: No    Alcohol/week: 0.0 oz  . Drug use: No     Allergies   Contrast media [iodinated diagnostic agents]; Cymbalta [duloxetine hcl]; Erythromycin; Latex; Levofloxacin; Other; Oxycodone; Septra [bactrim]; Sulfa antibiotics; and Morphine   Review of Systems Review of Systems  Constitutional: Negative.   Musculoskeletal: Positive for joint swelling.  Hematological: Bruises/bleeds easily.     Physical Exam Triage Vital Signs ED Triage Vitals  Enc Vitals Group     BP 02/06/18 1254 138/70     Pulse Rate 02/06/18 1254 70     Resp --      Temp 02/06/18 1254 97.8 F (36.6 C)     Temp Source 02/06/18 1254 Oral     SpO2 --      Weight --      Height --       Head Circumference --      Peak Flow --      Pain  Score 02/06/18 1255 2     Pain Loc --      Pain Edu? --      Excl. in [place]? --    No data found.  Updated Vital Signs BP 138/70 (BP Location: Right Arm)   Pulse 70   Temp 97.8 F (36.6 C) (Oral)   Visual Acuity Right Eye Distance:   Left Eye Distance:   Bilateral Distance:    Right Eye Near:   Left Eye Near:    Bilateral Near:     Physical Exam  Musculoskeletal:  2 x 3 cm hematoma over the proximal right forearm.  Neurovascular to the right wrist and hand is intact.     UC Treatments / Results  Labs (all labs ordered are listed, but only abnormal results are displayed) Labs Reviewed - No data to display  EKG None  Radiology Dg Forearm Right  Result Date: 02/06/2018 CLINICAL DATA:  Fall 2 days ago with right forearm pain, initial encounter EXAM: RIGHT FOREARM - 2 VIEW COMPARISON:  None. FINDINGS: No acute fracture or dislocation is noted. Mild soft tissue swelling is noted proximally consistent with the recent injury. IMPRESSION: Soft tissue swelling without acute bony abnormality. Electronically Signed   By: Inez Catalina M.D.   On: 02/06/2018 13:27    Procedures Procedures (including critical care time)  Medications Ordered in UC Medications - No data to display  Initial Impression / Assessment and Plan / UC Course  I have reviewed the triage vital signs and the nursing notes.  Pertinent labs & imaging results that were available during my care of the patient were reviewed by me and considered in my medical decision making (see chart for details). Patient has a hematoma over her right forearm.  She is on Xarelto and had trauma to her forearm 2 days ago.  She is on Xarelto which increased her bleeding.  This should resolve over the next few weeks.     Final Clinical Impressions(s) / UC Diagnoses   Final diagnoses:  Hematoma     Discharge Instructions     Apply ice as needed to your hematoma. This  area will show more bruising. This should resolve over the next few weeks.    ED Prescriptions    None     Controlled Substance Prescriptions [place] Controlled Substance Registry consulted? Not Applicable   Darlyne Russian, MD 02/06/18 1341

## 2018-02-06 NOTE — Discharge Instructions (Signed)
Apply ice as needed to your hematoma. This area will show more bruising. This should resolve over the next few weeks.

## 2018-02-09 ENCOUNTER — Encounter: Payer: Self-pay | Admitting: Cardiology

## 2018-02-09 DIAGNOSIS — Z7901 Long term (current) use of anticoagulants: Secondary | ICD-10-CM | POA: Diagnosis not present

## 2018-02-09 DIAGNOSIS — I4891 Unspecified atrial fibrillation: Secondary | ICD-10-CM | POA: Diagnosis not present

## 2018-02-09 DIAGNOSIS — I349 Nonrheumatic mitral valve disorder, unspecified: Secondary | ICD-10-CM | POA: Diagnosis not present

## 2018-02-09 DIAGNOSIS — R079 Chest pain, unspecified: Secondary | ICD-10-CM | POA: Diagnosis not present

## 2018-02-09 DIAGNOSIS — S40021A Contusion of right upper arm, initial encounter: Secondary | ICD-10-CM | POA: Diagnosis not present

## 2018-02-09 DIAGNOSIS — R0789 Other chest pain: Secondary | ICD-10-CM | POA: Diagnosis not present

## 2018-02-09 DIAGNOSIS — Z79899 Other long term (current) drug therapy: Secondary | ICD-10-CM | POA: Diagnosis not present

## 2018-02-09 DIAGNOSIS — F419 Anxiety disorder, unspecified: Secondary | ICD-10-CM | POA: Diagnosis not present

## 2018-02-09 DIAGNOSIS — I348 Other nonrheumatic mitral valve disorders: Secondary | ICD-10-CM | POA: Diagnosis not present

## 2018-02-09 DIAGNOSIS — Z794 Long term (current) use of insulin: Secondary | ICD-10-CM | POA: Diagnosis not present

## 2018-02-09 DIAGNOSIS — G629 Polyneuropathy, unspecified: Secondary | ICD-10-CM | POA: Diagnosis not present

## 2018-02-09 DIAGNOSIS — Z981 Arthrodesis status: Secondary | ICD-10-CM | POA: Diagnosis not present

## 2018-02-09 DIAGNOSIS — Z888 Allergy status to other drugs, medicaments and biological substances status: Secondary | ICD-10-CM | POA: Diagnosis not present

## 2018-02-09 DIAGNOSIS — Z91040 Latex allergy status: Secondary | ICD-10-CM | POA: Diagnosis not present

## 2018-02-09 DIAGNOSIS — Z882 Allergy status to sulfonamides status: Secondary | ICD-10-CM | POA: Diagnosis not present

## 2018-02-09 DIAGNOSIS — I480 Paroxysmal atrial fibrillation: Secondary | ICD-10-CM | POA: Diagnosis not present

## 2018-02-09 DIAGNOSIS — R55 Syncope and collapse: Secondary | ICD-10-CM | POA: Diagnosis not present

## 2018-02-09 DIAGNOSIS — F410 Panic disorder [episodic paroxysmal anxiety] without agoraphobia: Secondary | ICD-10-CM | POA: Diagnosis not present

## 2018-02-09 DIAGNOSIS — Z7982 Long term (current) use of aspirin: Secondary | ICD-10-CM | POA: Diagnosis not present

## 2018-02-15 DIAGNOSIS — Z79891 Long term (current) use of opiate analgesic: Secondary | ICD-10-CM | POA: Diagnosis not present

## 2018-02-15 DIAGNOSIS — G894 Chronic pain syndrome: Secondary | ICD-10-CM | POA: Diagnosis not present

## 2018-02-15 DIAGNOSIS — M4693 Unspecified inflammatory spondylopathy, cervicothoracic region: Secondary | ICD-10-CM | POA: Diagnosis not present

## 2018-02-15 DIAGNOSIS — M961 Postlaminectomy syndrome, not elsewhere classified: Secondary | ICD-10-CM | POA: Diagnosis not present

## 2018-02-18 DIAGNOSIS — M5412 Radiculopathy, cervical region: Secondary | ICD-10-CM | POA: Diagnosis not present

## 2018-02-18 DIAGNOSIS — G603 Idiopathic progressive neuropathy: Secondary | ICD-10-CM | POA: Diagnosis not present

## 2018-02-18 DIAGNOSIS — M5416 Radiculopathy, lumbar region: Secondary | ICD-10-CM | POA: Diagnosis not present

## 2018-02-18 DIAGNOSIS — R202 Paresthesia of skin: Secondary | ICD-10-CM | POA: Diagnosis not present

## 2018-02-25 DIAGNOSIS — H6123 Impacted cerumen, bilateral: Secondary | ICD-10-CM | POA: Diagnosis not present

## 2018-02-25 DIAGNOSIS — H6980 Other specified disorders of Eustachian tube, unspecified ear: Secondary | ICD-10-CM | POA: Diagnosis not present

## 2018-03-03 ENCOUNTER — Encounter: Payer: Self-pay | Admitting: Obstetrics & Gynecology

## 2018-03-03 DIAGNOSIS — Z1231 Encounter for screening mammogram for malignant neoplasm of breast: Secondary | ICD-10-CM | POA: Diagnosis not present

## 2018-03-03 LAB — HM MAMMOGRAPHY

## 2018-03-13 ENCOUNTER — Telehealth: Payer: Self-pay | Admitting: Cardiology

## 2018-03-13 DIAGNOSIS — Z881 Allergy status to other antibiotic agents status: Secondary | ICD-10-CM | POA: Diagnosis not present

## 2018-03-13 DIAGNOSIS — Z79899 Other long term (current) drug therapy: Secondary | ICD-10-CM | POA: Diagnosis not present

## 2018-03-13 DIAGNOSIS — Z87891 Personal history of nicotine dependence: Secondary | ICD-10-CM | POA: Diagnosis not present

## 2018-03-13 DIAGNOSIS — Z7982 Long term (current) use of aspirin: Secondary | ICD-10-CM | POA: Diagnosis not present

## 2018-03-13 DIAGNOSIS — F329 Major depressive disorder, single episode, unspecified: Secondary | ICD-10-CM | POA: Diagnosis not present

## 2018-03-13 DIAGNOSIS — R9431 Abnormal electrocardiogram [ECG] [EKG]: Secondary | ICD-10-CM | POA: Diagnosis not present

## 2018-03-13 DIAGNOSIS — I482 Chronic atrial fibrillation: Secondary | ICD-10-CM | POA: Diagnosis not present

## 2018-03-13 DIAGNOSIS — Z91041 Radiographic dye allergy status: Secondary | ICD-10-CM | POA: Diagnosis not present

## 2018-03-13 DIAGNOSIS — Z91048 Other nonmedicinal substance allergy status: Secondary | ICD-10-CM | POA: Diagnosis not present

## 2018-03-13 DIAGNOSIS — Z9889 Other specified postprocedural states: Secondary | ICD-10-CM | POA: Diagnosis not present

## 2018-03-13 DIAGNOSIS — Z882 Allergy status to sulfonamides status: Secondary | ICD-10-CM | POA: Diagnosis not present

## 2018-03-13 DIAGNOSIS — Z886 Allergy status to analgesic agent status: Secondary | ICD-10-CM | POA: Diagnosis not present

## 2018-03-13 DIAGNOSIS — I481 Persistent atrial fibrillation: Secondary | ICD-10-CM | POA: Diagnosis not present

## 2018-03-13 DIAGNOSIS — Z85820 Personal history of malignant melanoma of skin: Secondary | ICD-10-CM | POA: Diagnosis not present

## 2018-03-13 DIAGNOSIS — R000 Tachycardia, unspecified: Secondary | ICD-10-CM | POA: Diagnosis not present

## 2018-03-13 DIAGNOSIS — Z91040 Latex allergy status: Secondary | ICD-10-CM | POA: Diagnosis not present

## 2018-03-13 DIAGNOSIS — R002 Palpitations: Secondary | ICD-10-CM | POA: Diagnosis not present

## 2018-03-13 NOTE — Telephone Encounter (Signed)
Pt back in a fib, no chest pain or SOB.  She does have flecanide - asked her to take at 1000 if still in a fib - once only. Ok to stay at home unless chest pain or SOB.  Then she should go to ER will send to Dr. Wynonia Lawman.

## 2018-03-15 ENCOUNTER — Telehealth: Payer: Self-pay | Admitting: Physician Assistant

## 2018-03-15 DIAGNOSIS — Z888 Allergy status to other drugs, medicaments and biological substances status: Secondary | ICD-10-CM | POA: Diagnosis not present

## 2018-03-15 DIAGNOSIS — Z7982 Long term (current) use of aspirin: Secondary | ICD-10-CM | POA: Diagnosis not present

## 2018-03-15 DIAGNOSIS — R531 Weakness: Secondary | ICD-10-CM | POA: Diagnosis not present

## 2018-03-15 DIAGNOSIS — Z885 Allergy status to narcotic agent status: Secondary | ICD-10-CM | POA: Diagnosis not present

## 2018-03-15 DIAGNOSIS — Z87891 Personal history of nicotine dependence: Secondary | ICD-10-CM | POA: Diagnosis not present

## 2018-03-15 DIAGNOSIS — I482 Chronic atrial fibrillation: Secondary | ICD-10-CM | POA: Diagnosis not present

## 2018-03-15 DIAGNOSIS — K219 Gastro-esophageal reflux disease without esophagitis: Secondary | ICD-10-CM | POA: Diagnosis not present

## 2018-03-15 DIAGNOSIS — Z881 Allergy status to other antibiotic agents status: Secondary | ICD-10-CM | POA: Diagnosis not present

## 2018-03-15 DIAGNOSIS — Z79899 Other long term (current) drug therapy: Secondary | ICD-10-CM | POA: Diagnosis not present

## 2018-03-15 DIAGNOSIS — G629 Polyneuropathy, unspecified: Secondary | ICD-10-CM | POA: Diagnosis not present

## 2018-03-15 DIAGNOSIS — R0602 Shortness of breath: Secondary | ICD-10-CM | POA: Diagnosis not present

## 2018-03-15 DIAGNOSIS — R002 Palpitations: Secondary | ICD-10-CM | POA: Diagnosis not present

## 2018-03-15 NOTE — Telephone Encounter (Signed)
Did not need this encounter °

## 2018-03-16 ENCOUNTER — Ambulatory Visit (INDEPENDENT_AMBULATORY_CARE_PROVIDER_SITE_OTHER): Payer: Medicare Other | Admitting: Cardiovascular Disease

## 2018-03-16 ENCOUNTER — Encounter: Payer: Self-pay | Admitting: Cardiovascular Disease

## 2018-03-16 ENCOUNTER — Telehealth: Payer: Self-pay | Admitting: Nurse Practitioner

## 2018-03-16 DIAGNOSIS — I480 Paroxysmal atrial fibrillation: Secondary | ICD-10-CM

## 2018-03-16 NOTE — Patient Instructions (Addendum)
Medication Instructions:  Your physician recommends that you continue on your current medications as directed. Please refer to the Current Medication list given to you today.   Labwork: none  Testing/Procedures: none  Follow-Up: Follow up with Dr. Gwenlyn Found as needed.   You have been referred to A. Fib clinic - scheduled for 03/19/2018 2 11:30am   Any Other Special Instructions Will Be Listed Below (If Applicable).     If you need a refill on your cardiac medications before your next appointment, please call your pharmacy.

## 2018-03-16 NOTE — Telephone Encounter (Signed)
   Pt called this evening due to ongoing AFib.  She notes palpitations and just can't get comfortable but is not having chest pain, dyspnea, or presyncope.  She has taken her daily dilt and a dose of metoprolol at about 4pm.  She has pill in the pocket flecainide available.  I rec that she may take flecainide x 1 now.  If she remains in afib in 2 hrs, she should take another 12.5 of metoprolol and if she feels stable, may take the other 12.5 an hour later.  If she remains uncomfortable, she may need to come back to the ED for admission and possible DCCV. She has afib clinic f/u on 9/13, but is concerned that she will be too uncomfortable to wait until then.   Murray Hodgkins, NP 03/16/2018, 7:24 PM

## 2018-03-16 NOTE — Assessment & Plan Note (Signed)
[name] is seen as an add-on today after presenting to the ER in [place] for PAF with RVR.  She has had 3 episodes since August 6.  She does have flecainide as "pill in the pocket" which she has not taken.  She is on Xarelto oral anticoagulation.  She does have a history of mitral valve prolapse.  She is under a lot of stress at home and lives alone.  She is on diltiazem and metoprolol for rate control.  Today she is in A. fib with a heart rate of 112 although her blood pressure is somewhat soft 98/64 without room to increase this.  And refer her to our A. fib clinic and have them address the possibility of A. fib ablation.

## 2018-03-16 NOTE — Progress Notes (Signed)
03/16/2018 [name]   [birth year]  [id number]  Primary Physician [name], MD Primary Cardiologist: Lorretta Harp MD Garret Reddish, [place], [place]  HPI:  [name] is a 72 y.o. thin and frail appearing divorced Caucasian female mother of 1 child who is a patient of Dr. Wynonia Lawman and Tabori's.  She was referred by the ER for recent episode of A. fib with RVR.  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  She has no other cardiac risk factors.  She lives alone.  She is had PAF on Xarelto and negative chronotropic agents.  She is had 3 episodes of PAF with RVR since August 6.   Current Meds  Medication Sig  . acetaminophen (TYLENOL) 500 MG tablet Take 1,000 mg by mouth every 6 (six) hours as needed for mild pain.   . Calcium-Vitamin D-Vitamin K (VIACTIV PO) Take by mouth.  . cyclobenzaprine (FLEXERIL) 10 MG tablet TAKE 1 TABLET BY MOUTH THREE TIMES DAILY AS NEEDED FOR UP TO 10 DAYS FOR MUSCLE SPASM  . diltiazem (CARDIZEM CD) 120 MG 24 hr capsule Take 120 mg by mouth daily.  . meclizine (ANTIVERT) 25 MG tablet Take 1 tablet (25 mg total) by mouth 3 (three) times daily as needed for dizziness.  . metoprolol tartrate (LOPRESSOR) 25 MG tablet Take 1 tablet (25 mg total) by mouth 2 (two) times daily.  [name] Nystatin POWD Apply small amount to affected area.  . ondansetron (ZOFRAN) 4 MG tablet Take 1 tablet (4 mg total) by mouth every 8 (eight) hours as needed for nausea or vomiting.  [name] oxyCODONE (ROXICODONE) 5 MG/5ML solution 5-10 mls every 4-6 hours  . polyethylene glycol powder (GLYCOLAX/MIRALAX) powder   . rivaroxaban (XARELTO) 20 MG TABS tablet Take 1 tablet by mouth daily.  . Teriparatide, Recombinant, (FORTEO) 600 MCG/2.4ML SOLN INJECT 0.08ML (20MCG) UNDER THE SKIN ONCE DAILY  . valACYclovir (VALTREX) 500 MG tablet as needed.      Allergies  Allergen Reactions  . Contrast Media [Iodinated Diagnostic Agents] Hives    unknown   . Cymbalta [Duloxetine Hcl]     Severe diarrhea and upset stomach  . Erythromycin     unknown  . Latex     hives  . Levofloxacin Nausea And Vomiting  . Other     IV CONTRAST /"DYE".  . Oxycodone     Other reaction(s): Delusions (intolerance)  . Septra [Bactrim]     Hives   . Sulfa Antibiotics     rash  . Morphine Anxiety    Social History   Socioeconomic History  . Marital status: Divorced    Spouse name: Not on file  . Number of children: Not on file  . Years of education: Not on file  . Highest education level: Not on file  Occupational History  . Not on file  Social Needs  . Financial resource strain: Not on file  . Food insecurity:    Worry: Not on file    Inability: Not on file  . Transportation needs:    Medical: Not on file    Non-medical: Not on file  Tobacco Use  . Smoking status: Former Smoker    Packs/day: 0.25    Years: 14.00    Pack years: 3.50    Types: Cigarettes    Last attempt to quit: 07/07/1984    Years since quitting: 33.7  . Smokeless tobacco: Never Used  Substance  and Sexual Activity  . Alcohol use: No    Alcohol/week: 0.0 standard drinks  . Drug use: No  . Sexual activity: Not on file  Lifestyle  . Physical activity:    Days per week: Not on file    Minutes per session: Not on file  . Stress: Not on file  Relationships  . Social connections:    Talks on phone: Not on file    Gets together: Not on file    Attends religious service: Not on file    Active member of club or organization: Not on file    Attends meetings of clubs or organizations: Not on file    Relationship status: Not on file  . Intimate partner violence:    Fear of current or ex partner: Not on file    Emotionally abused: Not on file    Physically abused: Not on file    Forced sexual activity: Not on file  Other Topics Concern  . Not on file  Social History Narrative  . Not on file     Review of Systems: General: negative for chills, fever, night sweats or  weight changes.  Cardiovascular: negative for chest pain, dyspnea on exertion, edema, orthopnea, palpitations, paroxysmal nocturnal dyspnea or shortness of breath Dermatological: negative for rash Respiratory: negative for cough or wheezing Urologic: negative for hematuria Abdominal: negative for nausea, vomiting, diarrhea, bright red blood per rectum, melena, or hematemesis Neurologic: negative for visual changes, syncope, or dizziness All other systems reviewed and are otherwise negative except as noted above.    Blood pressure 98/64, pulse (!) 112, height 5\' 6"  (1.676 m), weight 124 lb 6.4 oz (56.4 kg).  General appearance: alert and no distress Neck: no adenopathy, no JVD, supple, symmetrical, trachea midline, thyroid not enlarged, symmetric, no tenderness/mass/nodules and Soft left carotid bruit Lungs: clear to auscultation bilaterally Heart: irregularly irregular rhythm Extremities: extremities normal, atraumatic, no cyanosis or edema Pulses: 2+ and symmetric Skin: Skin color, texture, turgor normal. No rashes or lesions Neurologic: Alert and oriented X 3, normal strength and tone. Normal symmetric reflexes. Normal coordination and gait  EKG atrial fibrillation with a ventricular response of 112, nonspecific ST and T wave changes and low limb voltage.  I personally reviewed this EKG.  ASSESSMENT AND PLAN:   Paroxysmal atrial fibrillation ([place]) Ms. Kosa is seen as an add-on today after presenting to the ER in [place] for PAF with RVR.  She has had 3 episodes since August 6.  She does have flecainide as "pill in the pocket" which she has not taken.  She is on Xarelto oral anticoagulation.  She does have a history of mitral valve prolapse.  She is under a lot of stress at home and lives alone.  She is on diltiazem and metoprolol for rate control.  Today she is in A. fib with a heart rate of 112 although her blood pressure is somewhat soft 98/64 without room to increase this.  And  refer her to our A. fib clinic and have them address the possibility of A. fib ablation.      Lorretta Harp MD FACP,FACC,FAHA, [facility] 03/16/2018 3:55 PM

## 2018-03-18 ENCOUNTER — Other Ambulatory Visit: Payer: Self-pay | Admitting: General Practice

## 2018-03-19 ENCOUNTER — Ambulatory Visit (HOSPITAL_COMMUNITY)
Admission: RE | Admit: 2018-03-19 | Discharge: 2018-03-19 | Disposition: A | Discharge status: Home / Self Care | Payer: Medicare Other | Source: Ambulatory Visit | Attending: Nurse Practitioner | Admitting: Nurse Practitioner

## 2018-03-19 ENCOUNTER — Encounter (HOSPITAL_COMMUNITY): Payer: Self-pay | Admitting: Nurse Practitioner

## 2018-03-19 VITALS — BP 110/62 | HR 120

## 2018-03-19 DIAGNOSIS — Z91040 Latex allergy status: Secondary | ICD-10-CM | POA: Insufficient documentation

## 2018-03-19 DIAGNOSIS — Z8781 Personal history of (healed) traumatic fracture: Secondary | ICD-10-CM | POA: Diagnosis not present

## 2018-03-19 DIAGNOSIS — Z91041 Radiographic dye allergy status: Secondary | ICD-10-CM | POA: Insufficient documentation

## 2018-03-19 DIAGNOSIS — Z981 Arthrodesis status: Secondary | ICD-10-CM | POA: Insufficient documentation

## 2018-03-19 DIAGNOSIS — I480 Paroxysmal atrial fibrillation: Secondary | ICD-10-CM | POA: Diagnosis not present

## 2018-03-19 DIAGNOSIS — Z881 Allergy status to other antibiotic agents status: Secondary | ICD-10-CM | POA: Insufficient documentation

## 2018-03-19 DIAGNOSIS — I481 Persistent atrial fibrillation: Secondary | ICD-10-CM | POA: Diagnosis not present

## 2018-03-19 DIAGNOSIS — Z888 Allergy status to other drugs, medicaments and biological substances status: Secondary | ICD-10-CM | POA: Diagnosis not present

## 2018-03-19 DIAGNOSIS — Z882 Allergy status to sulfonamides status: Secondary | ICD-10-CM | POA: Insufficient documentation

## 2018-03-19 DIAGNOSIS — Z79899 Other long term (current) drug therapy: Secondary | ICD-10-CM | POA: Insufficient documentation

## 2018-03-19 DIAGNOSIS — K219 Gastro-esophageal reflux disease without esophagitis: Secondary | ICD-10-CM | POA: Diagnosis not present

## 2018-03-19 DIAGNOSIS — G629 Polyneuropathy, unspecified: Secondary | ICD-10-CM | POA: Diagnosis not present

## 2018-03-19 DIAGNOSIS — Z7901 Long term (current) use of anticoagulants: Secondary | ICD-10-CM | POA: Diagnosis not present

## 2018-03-19 DIAGNOSIS — Z885 Allergy status to narcotic agent status: Secondary | ICD-10-CM | POA: Insufficient documentation

## 2018-03-19 DIAGNOSIS — Z87891 Personal history of nicotine dependence: Secondary | ICD-10-CM | POA: Diagnosis not present

## 2018-03-19 DIAGNOSIS — Z85820 Personal history of malignant melanoma of skin: Secondary | ICD-10-CM | POA: Diagnosis not present

## 2018-03-19 DIAGNOSIS — Z87442 Personal history of urinary calculi: Secondary | ICD-10-CM | POA: Insufficient documentation

## 2018-03-19 DIAGNOSIS — Z90710 Acquired absence of both cervix and uterus: Secondary | ICD-10-CM | POA: Diagnosis not present

## 2018-03-19 NOTE — Patient Instructions (Signed)
Start Flecainide 50mg twice a day 

## 2018-03-19 NOTE — Progress Notes (Signed)
Primary Care Physician: [name], MD Referring Physician: Dr. Gwenlyn Found Cardiologist: Dr. Allen Derry [name] is a 72 y.o. female with a h/o anxiety, paroxysmal afib, pt of Dr. Wynonia Lawman that is in the afib clinic, on referral of Dr. Gwenlyn Found for recent frequent ER visits to the [facility] ER for afib episodes. She tried to get a f/u with Dr. Wynonia Lawman but was  not able to get f/u until 10/4. She is in afib today. On last visit,with Dr. Wynonia Lawman, he gave her  flecainide 50 mg one tab only, if she felt she was going to go into afib. She has taken 3 tabs over the last few days without any improvement. She lives by herself and the afib causes her much anxiety as she feels very weak in afib. 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  Today, she denies symptoms of palpitations, chest pain, shortness of breath, orthopnea, PND, lower extremity edema, dizziness, presyncope, syncope, or neurologic sequela. The patient is tolerating medications without difficulties and is otherwise without complaint today.   Past Medical History:  Diagnosis Date  . Allergy   . Anxiety   . Benign paroxysmal positional vertigo 06/08/2013  . Chronic a-fib ([place]) 08/07/2014  . Fracture of multiple ribs   . GERD (gastroesophageal reflux disease)   . Hair loss 04/12/2012  . Herpes   . IC (intermittent claudication) ([place])   . Interstitial cystitis 11/06/2011  . Kidney stone   . 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 Osteopenia 02/18/2012  . Osteoporosis   . PAF (paroxysmal atrial fibrillation) ([place]) 2012  . Peripheral neuropathy 11/06/2011  . PMDD (premenstrual dysphoric disorder)   . Rotator cuff tear   . Vaginal delivery    ONE NSVD  . 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 BACK SURGERY     DISC FUSION L5,L6,L7  . BLADDER SURGERY     Bladder sling  . BREAST SURGERY     BREAST BIOPSY--RIGHT BENIGN  . BUNIONECTOMY    . HEAD & NECK SKIN LESION EXCISIONAL BIOPSY     melanoma  .  NECK FUSION    . ROTATOR CUFF REPAIR  2012   RIGHT  . SPINAL FUSION  dec 2015   . VAGINAL HYSTERECTOMY     TVH    Current Outpatient Medications  Medication Sig Dispense Refill  . acetaminophen (TYLENOL) 500 MG tablet Take 1,000 mg by mouth every 6 (six) hours as needed for mild pain.     . cyclobenzaprine (FLEXERIL) 10 MG tablet TAKE 1 TABLET BY MOUTH THREE TIMES DAILY AS NEEDED FOR UP TO 10 DAYS FOR MUSCLE SPASM  5  . diltiazem (CARDIZEM CD) 120 MG 24 hr capsule Take 120 mg by mouth daily.    . flecainide (TAMBOCOR) 50 MG tablet Take 50 mg by mouth 2 (two) times daily.    [name] gabapentin (NEURONTIN) 100 MG capsule Take 100 mg by mouth at bedtime.    . meclizine (ANTIVERT) 25 MG tablet Take 1 tablet (25 mg total) by mouth 3 (three) times daily as needed for dizziness. 45 tablet 0  . metoprolol tartrate (LOPRESSOR) 25 MG tablet Take 1 tablet (25 mg total) by mouth 2 (two) times daily. 60 tablet 11  . ondansetron (ZOFRAN) 4 MG tablet Take 1 tablet (4 mg total) by mouth every 8 (eight) hours as needed for nausea or vomiting. 30 tablet 0  . oxyCODONE (ROXICODONE) 5 MG/5ML solution 5-10  mls every 4-6 hours    . polyethylene glycol powder (GLYCOLAX/MIRALAX) powder     . rivaroxaban (XARELTO) 20 MG TABS tablet Take 1 tablet by mouth daily.    . Teriparatide, Recombinant, (FORTEO) 600 MCG/2.4ML SOLN INJECT 0.08ML (20MCG) UNDER THE SKIN ONCE DAILY    . valACYclovir (VALTREX) 500 MG tablet as needed.     . Calcium-Vitamin D-Vitamin K (VIACTIV PO) Take by mouth.    . Nystatin POWD Apply small amount to affected area. 1 Bottle 3   No current facility-administered medications for this encounter.     Allergies  Allergen Reactions  . Contrast Media [Iodinated Diagnostic Agents] Hives    unknown  . Cymbalta [Duloxetine Hcl]     Severe diarrhea and upset stomach  . Erythromycin     unknown  . Latex     hives  . Levofloxacin Nausea And Vomiting  . Other     IV CONTRAST /"DYE".  . Oxycodone      Other reaction(s): Delusions (intolerance)  . Septra [Bactrim]     Hives   . Sulfa Antibiotics     rash  . Morphine Anxiety    Social History   Socioeconomic History  . Marital status: Divorced    Spouse name: Not on file  . Number of children: Not on file  . Years of education: Not on file  . Highest education level: Not on file  Occupational History  . Not on file  Social Needs  . Financial resource strain: Not on file  . Food insecurity:    Worry: Not on file    Inability: Not on file  . Transportation needs:    Medical: Not on file    Non-medical: Not on file  Tobacco Use  . Smoking status: Former Smoker    Packs/day: 0.25    Years: 14.00    Pack years: 3.50    Types: Cigarettes    Last attempt to quit: 07/07/1984    Years since quitting: 33.7  . Smokeless tobacco: Never Used  Substance and Sexual Activity  . Alcohol use: No    Alcohol/week: 0.0 standard drinks  . Drug use: No  . Sexual activity: Not on file  Lifestyle  . Physical activity:    Days per week: Not on file    Minutes per session: Not on file  . Stress: Not on file  Relationships  . Social connections:    Talks on phone: Not on file    Gets together: Not on file    Attends religious service: Not on file    Active member of club or organization: Not on file    Attends meetings of clubs or organizations: Not on file    Relationship status: Not on file  . Intimate partner violence:    Fear of current or ex partner: Not on file    Emotionally abused: Not on file    Physically abused: Not on file    Forced sexual activity: Not on file  Other Topics Concern  . Not on file  Social History Narrative  . Not on file    Family History  Problem Relation Age of Onset  . Diabetes Father   . Hyperlipidemia Sister   . Heart disease Sister   . Stroke Sister   . Diabetes Brother   . Hyperlipidemia Sister   . Heart disease Sister   . Arthritis Mother   . Cancer Mother        uterus  . Heart  disease Mother   . Diabetes Brother   . Heart Problems Brother     ROS- All systems are reviewed and negative except as per the HPI above  Physical Exam: Vitals:   03/19/18 1131  BP: 110/62  Pulse: (!) 120   Wt Readings from Last 3 Encounters:  03/16/18 56.4 kg  02/05/18 56.9 kg  12/22/17 55.5 kg    Labs: Lab Results  Component Value Date   NA 143 09/23/2017   K 4.2 09/23/2017   CL 105 09/23/2017   CO2 29 09/23/2017   GLUCOSE 103 (H) 09/23/2017   BUN 24 (H) 09/23/2017   CREATININE 0.69 09/23/2017   CALCIUM 10.1 09/23/2017   MG 1.9 08/07/2014   Lab Results  Component Value Date   INR 0.93 04/24/2011   Lab Results  Component Value Date   CHOL 237 (H) 09/23/2017   HDL 100.90 09/23/2017   LDLCALC 116 (H) 09/23/2017   TRIG 101.0 09/23/2017     GEN- The patient is well appearing, alert and oriented x 3 today.   Head- normocephalic, atraumatic Eyes-  Sclera clear, conjunctiva pink Ears- hearing intact Oropharynx- clear Neck- supple, no JVP Lymph- no cervical lymphadenopathy Lungs- Clear to ausculation bilaterally, normal work of breathing Heart- Rapid irregular rate and rhythm, no murmurs, rubs or gallops, PMI not laterally displaced GI- soft, NT, ND, + BS Extremities- no clubbing, cyanosis, or edema MS- no significant deformity or atrophy Skin- no rash or lesion Psych- euthymic mood, full affect Neuro- strength and sensation are intact  EKG-afib at 120 bpm, qrs int 68 ms, qtc 443 ms Echo March 2019-done in Dr. 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 Persistent symptomatic  afib  Reassured pt  General education re afib Will continue with Cardizem and BB at current doses, with soft BP's hesitate  to increase rate control Discussed with Dr. Rayann Heman  He recommended flecainide 50 mg bid and recheck early next week, if still in afib and no significant interval changes increase to 100 mg bid and plan for  cardioversion She states no missed doses of xarelto Pt stases no h/o CAD and had a stress test within the last few years at low risk, I cannot see this in epic  Question if MVP with MOD MR nay be contributing to afib burden/symptoms and may need further evaluation   I will see back Monday F/u with Dr. Wynonia Lawman 10/4  Geroge Baseman. Damareon Lanni, [facility] [address] [place], [place] [address] [phone]

## 2018-03-20 ENCOUNTER — Telehealth: Payer: Self-pay | Admitting: Physician Assistant

## 2018-03-20 NOTE — Telephone Encounter (Signed)
Paged by answering service. She wants to take her metoprolol and Flecainide about 1 hour aprt. Advised its okay. She had mild nausea after taking her morning meds. Now resolved. Encouraged to continue to take medications and follow up in afib clinic as schedule Monday.

## 2018-03-21 ENCOUNTER — Telehealth: Payer: Self-pay | Admitting: Physician Assistant

## 2018-03-21 NOTE — Telephone Encounter (Signed)
Paged by answering service.  Patient had sudden onset shortness of breath this morning at 6:30 AM.  Later had headache.  No chest pain, palpitation, dizziness, syncope or melena.  She did not think she was in A. fib.  However, she has not checked her pulse or blood pressure.  Her symptoms lasted for about 45 minutes with self resolution after taking Tylenol.  During my conversation she was back to her baseline.  Encouraged to continue to take her medication as advised.  If recurrent symptoms, check pulse and blood pressure>> she will call me.  IF she has severe symptomatic atrial fibrillation>> she will call EMS or go to ER. She is agree with plan.

## 2018-03-22 ENCOUNTER — Encounter: Payer: Medicare Other | Admitting: Obstetrics & Gynecology

## 2018-03-22 ENCOUNTER — Ambulatory Visit (HOSPITAL_COMMUNITY)
Admission: RE | Admit: 2018-03-22 | Discharge: 2018-03-22 | Disposition: A | Discharge status: Home / Self Care | Payer: Medicare Other | Source: Ambulatory Visit | Attending: Nurse Practitioner | Admitting: Nurse Practitioner

## 2018-03-22 VITALS — BP 144/72 | HR 73

## 2018-03-22 DIAGNOSIS — G629 Polyneuropathy, unspecified: Secondary | ICD-10-CM | POA: Insufficient documentation

## 2018-03-22 DIAGNOSIS — F419 Anxiety disorder, unspecified: Secondary | ICD-10-CM | POA: Insufficient documentation

## 2018-03-22 DIAGNOSIS — Z888 Allergy status to other drugs, medicaments and biological substances status: Secondary | ICD-10-CM | POA: Diagnosis not present

## 2018-03-22 DIAGNOSIS — Z87891 Personal history of nicotine dependence: Secondary | ICD-10-CM | POA: Insufficient documentation

## 2018-03-22 DIAGNOSIS — K219 Gastro-esophageal reflux disease without esophagitis: Secondary | ICD-10-CM | POA: Insufficient documentation

## 2018-03-22 DIAGNOSIS — Z882 Allergy status to sulfonamides status: Secondary | ICD-10-CM | POA: Insufficient documentation

## 2018-03-22 DIAGNOSIS — I480 Paroxysmal atrial fibrillation: Secondary | ICD-10-CM | POA: Diagnosis not present

## 2018-03-22 DIAGNOSIS — Z885 Allergy status to narcotic agent status: Secondary | ICD-10-CM | POA: Diagnosis not present

## 2018-03-22 DIAGNOSIS — Z7901 Long term (current) use of anticoagulants: Secondary | ICD-10-CM | POA: Insufficient documentation

## 2018-03-22 DIAGNOSIS — R9431 Abnormal electrocardiogram [ECG] [EKG]: Secondary | ICD-10-CM | POA: Diagnosis not present

## 2018-03-22 DIAGNOSIS — Z79899 Other long term (current) drug therapy: Secondary | ICD-10-CM | POA: Diagnosis not present

## 2018-03-22 DIAGNOSIS — Z87442 Personal history of urinary calculi: Secondary | ICD-10-CM | POA: Diagnosis not present

## 2018-03-22 NOTE — Progress Notes (Signed)
Pt in for repeat EKG after starting Flecainide.  Pt discontinued the medication Sunday after having difficulty breathing.  To be reviewed by Roderic Palau, NP

## 2018-03-22 NOTE — Progress Notes (Signed)
Pt in for repeat EKG after starting Flecainide.  Pt discontinued the medication Sunday after having difficulty breathing.  To be reviewed by Roderic Palau, NP. Please see office note from today.

## 2018-03-22 NOTE — Progress Notes (Signed)
Pt in for repeat EKG after starting Flecainide.  Pt discontinued the medication Sunday after having difficulty breathing.  To be reviewed by Roderic Palau, NP   Primary Care Physician: [name], MD Referring Physician: Dr. Gwenlyn Found Cardiologist: Dr. Allen Derry [name] is a 72 y.o. female with a h/o anxiety, paroxysmal afib, pt of Dr. Wynonia Lawman that is in the afib clinic, on referral of Dr. Gwenlyn Found for recent frequent ER visits to the [facility] ER for afib episodes. She tried to get a f/u with Dr. Wynonia Lawman but was  not able to get f/u until 10/4. She is in afib today. On last visit,with Dr. Wynonia Lawman, he gave her  flecainide 50 mg one tab only, if she felt she was going to go into afib. She has taken 3 tabs over the last few days without any improvement. She lives by herself and the afib causes her much anxiety as she feels very weak in afib. 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  She was started on flecainide 50 mg bid and asked to  come back to the office today for EKG on flecainide.  Pt stopped the med yesterday as she said it made her have some itching under her breast, shortness of breath and H/A. She feels better off drug. She is in SR today but pt wasn't exactly sure she was in rhythm. She does want to get on a drug to keep her in rhythm as it is making her extremely worried about going into afib again.  Today, she denies symptoms of palpitations, chest pain, shortness of breath, orthopnea, PND, lower extremity edema, dizziness, presyncope, syncope, or neurologic sequela. The patient is tolerating medications without difficulties and is otherwise without complaint today.   Past Medical History:  Diagnosis Date  . Allergy   . Anxiety   . Benign paroxysmal positional vertigo 06/08/2013  . Chronic a-fib ([place]) 08/07/2014  . Fracture of multiple ribs   . GERD (gastroesophageal reflux disease)   . Hair loss 04/12/2012  . Herpes   . IC (intermittent claudication) ([place])   .  Interstitial cystitis 11/06/2011  . Kidney stone   . 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 Osteopenia 02/18/2012  . Osteoporosis   . PAF (paroxysmal atrial fibrillation) ([place]) 2012  . Peripheral neuropathy 11/06/2011  . PMDD (premenstrual dysphoric disorder)   . Rotator cuff tear   . Vaginal delivery    ONE NSVD  . 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 BACK SURGERY     DISC FUSION L5,L6,L7  . BLADDER SURGERY     Bladder sling  . BREAST SURGERY     BREAST BIOPSY--RIGHT BENIGN  . BUNIONECTOMY    . HEAD & NECK SKIN LESION EXCISIONAL BIOPSY     melanoma  . NECK FUSION    . ROTATOR CUFF REPAIR  2012   RIGHT  . SPINAL FUSION  dec 2015   . VAGINAL HYSTERECTOMY     TVH    Current Outpatient Medications  Medication Sig Dispense Refill  . acetaminophen (TYLENOL) 500 MG tablet Take 1,000 mg by mouth every 6 (six) hours as needed for mild pain.     . Calcium-Vitamin D-Vitamin K (VIACTIV PO) Take by mouth.    . cyclobenzaprine (FLEXERIL) 10 MG tablet TAKE 1 TABLET BY MOUTH THREE TIMES DAILY AS NEEDED FOR UP TO 10 DAYS FOR MUSCLE SPASM  5  . diltiazem (CARDIZEM CD) 120 MG 24  hr capsule Take 120 mg by mouth daily.    . flecainide (TAMBOCOR) 50 MG tablet Take 50 mg by mouth 2 (two) times daily.    [name] gabapentin (NEURONTIN) 100 MG capsule Take 100 mg by mouth at bedtime.    . meclizine (ANTIVERT) 25 MG tablet Take 1 tablet (25 mg total) by mouth 3 (three) times daily as needed for dizziness. 45 tablet 0  . metoprolol tartrate (LOPRESSOR) 25 MG tablet Take 1 tablet (25 mg total) by mouth 2 (two) times daily. 60 tablet 11  . Nystatin POWD Apply small amount to affected area. 1 Bottle 3  . ondansetron (ZOFRAN) 4 MG tablet Take 1 tablet (4 mg total) by mouth every 8 (eight) hours as needed for nausea or vomiting. 30 tablet 0  . oxyCODONE (ROXICODONE) 5 MG/5ML solution 5-10 mls every 4-6 hours    . polyethylene glycol powder (GLYCOLAX/MIRALAX) powder     . rivaroxaban  (XARELTO) 20 MG TABS tablet Take 1 tablet by mouth daily.    . Teriparatide, Recombinant, (FORTEO) 600 MCG/2.4ML SOLN INJECT 0.08ML (20MCG) UNDER THE SKIN ONCE DAILY    . valACYclovir (VALTREX) 500 MG tablet as needed.      No current facility-administered medications for this encounter.     Allergies  Allergen Reactions  . Contrast Media [Iodinated Diagnostic Agents] Hives    unknown  . Cymbalta [Duloxetine Hcl]     Severe diarrhea and upset stomach  . Erythromycin     unknown  . Latex     hives  . Levofloxacin Nausea And Vomiting  . Other     IV CONTRAST /"DYE".  . Oxycodone     Other reaction(s): Delusions (intolerance)  . Septra [Bactrim]     Hives   . Sulfa Antibiotics     rash  . Morphine Anxiety    Social History   Socioeconomic History  . Marital status: Divorced    Spouse name: Not on file  . Number of children: Not on file  . Years of education: Not on file  . Highest education level: Not on file  Occupational History  . Not on file  Social Needs  . Financial resource strain: Not on file  . Food insecurity:    Worry: Not on file    Inability: Not on file  . Transportation needs:    Medical: Not on file    Non-medical: Not on file  Tobacco Use  . Smoking status: Former Smoker    Packs/day: 0.25    Years: 14.00    Pack years: 3.50    Types: Cigarettes    Last attempt to quit: 07/07/1984    Years since quitting: 33.7  . Smokeless tobacco: Never Used  Substance and Sexual Activity  . Alcohol use: No    Alcohol/week: 0.0 standard drinks  . Drug use: No  . Sexual activity: Not on file  Lifestyle  . Physical activity:    Days per week: Not on file    Minutes per session: Not on file  . Stress: Not on file  Relationships  . Social connections:    Talks on phone: Not on file    Gets together: Not on file    Attends religious service: Not on file    Active member of club or organization: Not on file    Attends meetings of clubs or organizations:  Not on file    Relationship status: Not on file  . Intimate partner violence:    Fear of  current or ex partner: Not on file    Emotionally abused: Not on file    Physically abused: Not on file    Forced sexual activity: Not on file  Other Topics Concern  . Not on file  Social History Narrative  . Not on file    Family History  Problem Relation Age of Onset  . Diabetes Father   . Hyperlipidemia Sister   . Heart disease Sister   . Stroke Sister   . Diabetes Brother   . Hyperlipidemia Sister   . Heart disease Sister   . Arthritis Mother   . Cancer Mother        uterus  . Heart disease Mother   . Diabetes Brother   . Heart Problems Brother     ROS- All systems are reviewed and negative except as per the HPI above  Physical Exam: Vitals:   03/22/18 1435  BP: (!) 144/72  Pulse: 73   Wt Readings from Last 3 Encounters:  03/16/18 56.4 kg  02/05/18 56.9 kg  12/22/17 55.5 kg    Labs: Lab Results  Component Value Date   NA 143 09/23/2017   K 4.2 09/23/2017   CL 105 09/23/2017   CO2 29 09/23/2017   GLUCOSE 103 (H) 09/23/2017   BUN 24 (H) 09/23/2017   CREATININE 0.69 09/23/2017   CALCIUM 10.1 09/23/2017   MG 1.9 08/07/2014   Lab Results  Component Value Date   INR 0.93 04/24/2011   Lab Results  Component Value Date   CHOL 237 (H) 09/23/2017   HDL 100.90 09/23/2017   LDLCALC 116 (H) 09/23/2017   TRIG 101.0 09/23/2017     GEN- The patient is well appearing, alert and oriented x 3 today.   Head- normocephalic, atraumatic Eyes-  Sclera clear, conjunctiva pink Ears- hearing intact Oropharynx- clear Neck- supple, no JVP Lymph- no cervical lymphadenopathy Lungs- Clear to ausculation bilaterally, normal work of breathing Heart- Rapid irregular rate and rhythm, no murmurs, rubs or gallops, PMI not laterally displaced GI- soft, NT, ND, + BS Extremities- no clubbing, cyanosis, or edema MS- no significant deformity or atrophy Skin- no rash or lesion Psych-  euthymic mood, full affect Neuro- strength and sensation are intact  EKG-afib at 120 bpm, qrs int 68 ms, qtc 443 ms Echo March 2019-done in Dr. 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 Persistent symptomatic  afib  Pt did not tolerate flecainide,stopped yesterday, not sure if all of her side effects were true or anxiety driven She does want to stay in SR and wants to purse another antiarrythmic I discussed amiodarone but I feel her anxiety will predispose her to be symptomatic with perceived side effects I also discussed tikosyn and she will check on cost of drug and if she wants to pursue She states no missed doses of xarelto Question if MVP with MOD MR nay be contributing to afib burden/symptoms and may need further evaluation   I will see back depending on what drug pt wants to pursue F/u with Dr. Wynonia Lawman 10/4  Geroge Baseman. Adasia Hoar, [facility] [address] [place], [place] [address] [phone]

## 2018-03-23 ENCOUNTER — Other Ambulatory Visit (HOSPITAL_COMMUNITY): Payer: Self-pay | Admitting: *Deleted

## 2018-03-23 ENCOUNTER — Telehealth: Payer: Self-pay | Admitting: Pharmacist

## 2018-03-23 NOTE — Telephone Encounter (Signed)
Medication list reviewed in anticipation of upcoming Tikosyn initiation. Patient is not taking any contraindicated or QTc prolonging medications.   Patient is anticoagulated on Xarelto 20mg  daily on the appropriate dose. Please ensure that patient has not missed any anticoagulation doses in the 3 weeks prior to Tikosyn initiation. Please clarify that pt has been taking Xarelto with the largest meal of the day for optimal absorption and efficacy as his current medication instructions do not state so.  Patient will need to be counseled to avoid use of Benadryl while on Tikosyn and in the 2-3 days prior to Tikosyn initiation.

## 2018-03-29 ENCOUNTER — Other Ambulatory Visit: Payer: Self-pay

## 2018-03-29 ENCOUNTER — Inpatient Hospital Stay (HOSPITAL_COMMUNITY)
Admission: AD | Admit: 2018-03-29 | Discharge: 2018-04-01 | DRG: 309 | Disposition: A | Discharge status: Home / Self Care | Payer: Medicare Other | Source: Ambulatory Visit | Attending: Internal Medicine | Admitting: Internal Medicine

## 2018-03-29 ENCOUNTER — Ambulatory Visit (HOSPITAL_COMMUNITY)
Admission: RE | Admit: 2018-03-29 | Discharge: 2018-03-29 | Disposition: A | Discharge status: Home / Self Care | Payer: Medicare Other | Source: Ambulatory Visit | Attending: Nurse Practitioner | Admitting: Nurse Practitioner

## 2018-03-29 ENCOUNTER — Encounter (HOSPITAL_COMMUNITY): Payer: Self-pay | Admitting: Nurse Practitioner

## 2018-03-29 VITALS — BP 138/74 | HR 72 | Ht 66.0 in | Wt 126.0 lb

## 2018-03-29 DIAGNOSIS — I480 Paroxysmal atrial fibrillation: Secondary | ICD-10-CM

## 2018-03-29 DIAGNOSIS — F419 Anxiety disorder, unspecified: Secondary | ICD-10-CM | POA: Diagnosis present

## 2018-03-29 DIAGNOSIS — Z823 Family history of stroke: Secondary | ICD-10-CM

## 2018-03-29 DIAGNOSIS — Z79899 Other long term (current) drug therapy: Secondary | ICD-10-CM

## 2018-03-29 DIAGNOSIS — Z8619 Personal history of other infectious and parasitic diseases: Secondary | ICD-10-CM

## 2018-03-29 DIAGNOSIS — I340 Nonrheumatic mitral (valve) insufficiency: Secondary | ICD-10-CM | POA: Diagnosis not present

## 2018-03-29 DIAGNOSIS — Z79891 Long term (current) use of opiate analgesic: Secondary | ICD-10-CM

## 2018-03-29 DIAGNOSIS — I4891 Unspecified atrial fibrillation: Secondary | ICD-10-CM | POA: Diagnosis not present

## 2018-03-29 DIAGNOSIS — Z888 Allergy status to other drugs, medicaments and biological substances status: Secondary | ICD-10-CM

## 2018-03-29 DIAGNOSIS — Z833 Family history of diabetes mellitus: Secondary | ICD-10-CM

## 2018-03-29 DIAGNOSIS — K219 Gastro-esophageal reflux disease without esophagitis: Secondary | ICD-10-CM | POA: Diagnosis not present

## 2018-03-29 DIAGNOSIS — N390 Urinary tract infection, site not specified: Secondary | ICD-10-CM | POA: Diagnosis not present

## 2018-03-29 DIAGNOSIS — Z90710 Acquired absence of both cervix and uterus: Secondary | ICD-10-CM | POA: Diagnosis not present

## 2018-03-29 DIAGNOSIS — Z791 Long term (current) use of non-steroidal anti-inflammatories (NSAID): Secondary | ICD-10-CM | POA: Diagnosis not present

## 2018-03-29 DIAGNOSIS — Z885 Allergy status to narcotic agent status: Secondary | ICD-10-CM

## 2018-03-29 DIAGNOSIS — Z87891 Personal history of nicotine dependence: Secondary | ICD-10-CM | POA: Diagnosis not present

## 2018-03-29 DIAGNOSIS — Z87442 Personal history of urinary calculi: Secondary | ICD-10-CM

## 2018-03-29 DIAGNOSIS — Z809 Family history of malignant neoplasm, unspecified: Secondary | ICD-10-CM

## 2018-03-29 DIAGNOSIS — R350 Frequency of micturition: Secondary | ICD-10-CM | POA: Diagnosis present

## 2018-03-29 DIAGNOSIS — Z8261 Family history of arthritis: Secondary | ICD-10-CM

## 2018-03-29 DIAGNOSIS — Z5181 Encounter for therapeutic drug level monitoring: Secondary | ICD-10-CM

## 2018-03-29 DIAGNOSIS — Z7901 Long term (current) use of anticoagulants: Secondary | ICD-10-CM

## 2018-03-29 DIAGNOSIS — G8929 Other chronic pain: Secondary | ICD-10-CM | POA: Diagnosis present

## 2018-03-29 DIAGNOSIS — Z981 Arthrodesis status: Secondary | ICD-10-CM

## 2018-03-29 DIAGNOSIS — H8110 Benign paroxysmal vertigo, unspecified ear: Secondary | ICD-10-CM | POA: Diagnosis present

## 2018-03-29 DIAGNOSIS — Z886 Allergy status to analgesic agent status: Secondary | ICD-10-CM

## 2018-03-29 DIAGNOSIS — Z91040 Latex allergy status: Secondary | ICD-10-CM

## 2018-03-29 DIAGNOSIS — Z8249 Family history of ischemic heart disease and other diseases of the circulatory system: Secondary | ICD-10-CM

## 2018-03-29 DIAGNOSIS — M810 Age-related osteoporosis without current pathological fracture: Secondary | ICD-10-CM | POA: Diagnosis not present

## 2018-03-29 DIAGNOSIS — Z9889 Other specified postprocedural states: Secondary | ICD-10-CM | POA: Diagnosis not present

## 2018-03-29 DIAGNOSIS — G629 Polyneuropathy, unspecified: Secondary | ICD-10-CM | POA: Diagnosis present

## 2018-03-29 DIAGNOSIS — Z881 Allergy status to other antibiotic agents status: Secondary | ICD-10-CM

## 2018-03-29 DIAGNOSIS — Z91041 Radiographic dye allergy status: Secondary | ICD-10-CM

## 2018-03-29 DIAGNOSIS — M549 Dorsalgia, unspecified: Secondary | ICD-10-CM | POA: Diagnosis not present

## 2018-03-29 DIAGNOSIS — Z85820 Personal history of malignant melanoma of skin: Secondary | ICD-10-CM

## 2018-03-29 DIAGNOSIS — Z882 Allergy status to sulfonamides status: Secondary | ICD-10-CM

## 2018-03-29 DIAGNOSIS — Z8349 Family history of other endocrine, nutritional and metabolic diseases: Secondary | ICD-10-CM

## 2018-03-29 HISTORY — DX: Nausea with vomiting, unspecified: R11.2

## 2018-03-29 HISTORY — DX: Nausea with vomiting, unspecified: Z98.890

## 2018-03-29 HISTORY — DX: Personal history of other medical treatment: Z92.89

## 2018-03-29 HISTORY — DX: Unspecified osteoarthritis, unspecified site: M19.90

## 2018-03-29 HISTORY — DX: Personal history of urinary calculi: Z87.442

## 2018-03-29 HISTORY — DX: Other seasonal allergic rhinitis: J30.2

## 2018-03-29 LAB — BASIC METABOLIC PANEL
Anion gap: 11 (ref 5–15)
BUN: 14 mg/dL (ref 8–23)
CO2: 26 mmol/L (ref 22–32)
Calcium: 9.4 mg/dL (ref 8.9–10.3)
Chloride: 106 mmol/L (ref 98–111)
Creatinine, Ser: 0.67 mg/dL (ref 0.44–1.00)
GFR calc Af Amer: >60 mL/min (ref >60)
GFR calc non Af Amer: >60 mL/min (ref >60)
Glucose, Bld: 91 mg/dL (ref 70–99)
Potassium: 3.9 mmol/L (ref 3.5–5.1)
Sodium: 143 mmol/L (ref 135–145)

## 2018-03-29 LAB — POTASSIUM: Potassium: 4.8 mmol/L (ref 3.5–5.1)

## 2018-03-29 LAB — MAGNESIUM: Magnesium: 2.1 mg/dL (ref 1.7–2.4)

## 2018-03-29 MED ORDER — POTASSIUM CHLORIDE CRYS ER 20 MEQ PO TBCR
40.0000 meq | EXTENDED_RELEASE_TABLET | Freq: Once | ORAL | Status: AC
  Administered 2018-03-29: 40 meq via ORAL
  Filled 2018-03-29: qty 2

## 2018-03-29 MED ORDER — GABAPENTIN 100 MG PO CAPS
100.0000 mg | ORAL_CAPSULE | Freq: Every day | ORAL | Status: DC
  Administered 2018-03-29 – 2018-03-31 (×3): 100 mg via ORAL
  Filled 2018-03-29 (×3): qty 1

## 2018-03-29 MED ORDER — CYCLOBENZAPRINE HCL 10 MG PO TABS: 10.0000 mg | ORAL_TABLET | Freq: Three times a day (TID) | ORAL | Status: DC | PRN

## 2018-03-29 MED ORDER — OXYCODONE HCL 5 MG/5ML PO SOLN
6.5000 mg | ORAL | Status: DC | PRN
  Administered 2018-03-29 – 2018-04-01 (×15): 6.5 mg via ORAL
  Filled 2018-03-29 (×15): qty 10

## 2018-03-29 MED ORDER — SODIUM CHLORIDE 0.9% FLUSH: 3.0000 mL | INTRAVENOUS | Status: DC | PRN

## 2018-03-29 MED ORDER — METOPROLOL TARTRATE 25 MG PO TABS
25.0000 mg | ORAL_TABLET | Freq: Two times a day (BID) | ORAL | Status: DC
  Administered 2018-03-29 – 2018-04-01 (×6): 25 mg via ORAL
  Filled 2018-03-29 (×6): qty 1

## 2018-03-29 MED ORDER — SODIUM CHLORIDE 0.9% FLUSH
3.0000 mL | Freq: Two times a day (BID) | INTRAVENOUS | Status: DC
  Administered 2018-03-29 – 2018-04-01 (×7): 3 mL via INTRAVENOUS

## 2018-03-29 MED ORDER — DOFETILIDE 500 MCG PO CAPS
500.0000 ug | ORAL_CAPSULE | Freq: Two times a day (BID) | ORAL | Status: DC
  Administered 2018-03-29: 500 ug via ORAL
  Filled 2018-03-29: qty 1

## 2018-03-29 MED ORDER — SODIUM CHLORIDE 0.9 % IV SOLN: 250.0000 mL | INTRAVENOUS | Status: DC | PRN

## 2018-03-29 MED ORDER — RIVAROXABAN 20 MG PO TABS
20.0000 mg | ORAL_TABLET | Freq: Every day | ORAL | Status: DC
  Administered 2018-03-29 – 2018-03-31 (×3): 20 mg via ORAL
  Filled 2018-03-29 (×3): qty 1

## 2018-03-29 MED ORDER — DOFETILIDE 500 MCG PO CAPS: 500.0000 ug | ORAL_CAPSULE | Freq: Two times a day (BID) | ORAL | Status: DC

## 2018-03-29 MED ORDER — DILTIAZEM HCL ER COATED BEADS 120 MG PO CP24
120.0000 mg | ORAL_CAPSULE | Freq: Every day | ORAL | Status: DC
  Administered 2018-03-29 – 2018-04-01 (×4): 120 mg via ORAL
  Filled 2018-03-29 (×4): qty 1

## 2018-03-29 MED ORDER — OXYCODONE HCL 5 MG/5ML PO SOLN: 5.0000 mg | ORAL | Status: DC | PRN

## 2018-03-29 MED ORDER — TERIPARATIDE (RECOMBINANT) 600 MCG/2.4ML ~~LOC~~ SOLN: 20.0000 ug | Freq: Every evening | SUBCUTANEOUS | Status: DC

## 2018-03-29 MED ORDER — ACETAMINOPHEN 500 MG PO TABS: 1000.0000 mg | ORAL_TABLET | Freq: Four times a day (QID) | ORAL | Status: DC | PRN

## 2018-03-29 NOTE — Progress Notes (Signed)
Primary Care Physician: [name], MD Referring Physician: Dr. Gwenlyn Found Cardiologist: Dr. Allen Derry [name] is a 72 y.o. female with a h/o anxiety, paroxysmal afib, pt of Dr. Wynonia Lawman that is in the afib clinic, on referral of Dr. Gwenlyn Found for recent frequent ER visits to the [facility] ER for afib episodes ( 3 visits in a 3-4 week period of time). She tried to get a f/u with Dr. Wynonia Lawman but was  not able to get f/u until 10/4. She is in afib today. On last visit,with Dr. Wynonia Lawman, he gave her  flecainide 50 mg one tab only, if she felt she was going to go into afib. She has taken 3 tabs over the last few days without any improvement. She lives by herself and the afib causes her much anxiety as she feels very weak in afib. 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  She was started on flecainide 50 mg bid and asked to  come back to the office today for EKG on flecainide.  Pt stopped the med yesterday as she said it made her have some itching under her breast, shortness of breath and H/A. She feels better off drug. She is in SR today but pt wasn't exactly sure she was in rhythm. She does want to get on a drug to keep her in rhythm as it is making her extremely worried about going into afib again.   F/u in afib clinic, 9/23. I discussed antiarrythmic's with pt but she felt that she would not tolerate amiodarone  with possible side effect profile. Cost would make multaq cost prohibitive, therefore, she had decided to try tikosyn. Pt meets patient assistance guidelines. She is not taking any qtc prolonging drugs.No benadryl use. She is in SR today.   Today, she denies symptoms of palpitations, chest pain, shortness of breath, orthopnea, PND, lower extremity edema, dizziness, presyncope, syncope, or neurologic sequela. The patient is tolerating medications without difficulties and is otherwise without complaint today.   Past Medical History:  Diagnosis Date  . Allergy   . Anxiety   .  Benign paroxysmal positional vertigo 06/08/2013  . Chronic a-fib ([place]) 08/07/2014  . Fracture of multiple ribs   . GERD (gastroesophageal reflux disease)   . Hair loss 04/12/2012  . Herpes   . IC (intermittent claudication) ([place])   . Interstitial cystitis 11/06/2011  . Kidney stone   . 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 Osteopenia 02/18/2012  . Osteoporosis   . PAF (paroxysmal atrial fibrillation) ([place]) 2012  . Peripheral neuropathy 11/06/2011  . PMDD (premenstrual dysphoric disorder)   . Rotator cuff tear   . Vaginal delivery    ONE NSVD  . 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 BACK SURGERY     DISC FUSION L5,L6,L7  . BLADDER SURGERY     Bladder sling  . BREAST SURGERY     BREAST BIOPSY--RIGHT BENIGN  . BUNIONECTOMY    . HEAD & NECK SKIN LESION EXCISIONAL BIOPSY     melanoma  . NECK FUSION    . ROTATOR CUFF REPAIR  2012   RIGHT  . SPINAL FUSION  dec 2015   . VAGINAL HYSTERECTOMY     TVH    Current Outpatient Medications  Medication Sig Dispense Refill  . acetaminophen (TYLENOL) 500 MG tablet Take 1,000 mg by mouth every 6 (six) hours as needed for mild pain.     [name]  Calcium Carbonate Antacid (TUMS CHEWY BITES PO) Take by mouth.    . Calcium-Vitamin D-Vitamin K (VIACTIV PO) Take by mouth.    . diltiazem (CARDIZEM CD) 120 MG 24 hr capsule Take 120 mg by mouth daily.    [name] gabapentin (NEURONTIN) 100 MG capsule Take 100 mg by mouth at bedtime.    . metoprolol tartrate (LOPRESSOR) 25 MG tablet Take 1 tablet (25 mg total) by mouth 2 (two) times daily. 60 tablet 11  . Nystatin POWD Apply small amount to affected area. 1 Bottle 3  . oxyCODONE (ROXICODONE) 5 MG/5ML solution 5-10 mls every 4-6 hours    . polyethylene glycol powder (GLYCOLAX/MIRALAX) powder     . rivaroxaban (XARELTO) 20 MG TABS tablet Take 1 tablet by mouth daily.    . Teriparatide, Recombinant, (FORTEO) 600 MCG/2.4ML SOLN INJECT 0.08ML (20MCG) UNDER THE SKIN ONCE DAILY    . valACYclovir  (VALTREX) 500 MG tablet as needed.     . cyclobenzaprine (FLEXERIL) 10 MG tablet TAKE 1 TABLET BY MOUTH THREE TIMES DAILY AS NEEDED FOR UP TO 10 DAYS FOR MUSCLE SPASM  5  . meclizine (ANTIVERT) 25 MG tablet Take 1 tablet (25 mg total) by mouth 3 (three) times daily as needed for dizziness. (Patient not taking: Reported on 03/29/2018) 45 tablet 0   No current facility-administered medications for this encounter.     Allergies  Allergen Reactions  . Contrast Media [Iodinated Diagnostic Agents] Hives    unknown  . Cymbalta [Duloxetine Hcl]     Severe diarrhea and upset stomach  . Erythromycin     unknown  . Latex     hives  . Levofloxacin Nausea And Vomiting  . Other     IV CONTRAST /"DYE".  . Oxycodone     Other reaction(s): Delusions (intolerance)  PILLS ONLY  . Septra [Bactrim]     Hives   . Sulfa Antibiotics     rash  . Morphine Anxiety    Social History   Socioeconomic History  . Marital status: Divorced    Spouse name: Not on file  . Number of children: Not on file  . Years of education: Not on file  . Highest education level: Not on file  Occupational History  . Not on file  Social Needs  . Financial resource strain: Not on file  . Food insecurity:    Worry: Not on file    Inability: Not on file  . Transportation needs:    Medical: Not on file    Non-medical: Not on file  Tobacco Use  . Smoking status: Former Smoker    Packs/day: 0.25    Years: 14.00    Pack years: 3.50    Types: Cigarettes    Last attempt to quit: 07/07/1984    Years since quitting: 33.7  . Smokeless tobacco: Never Used  Substance and Sexual Activity  . Alcohol use: No    Alcohol/week: 0.0 standard drinks  . Drug use: No  . Sexual activity: Not on file  Lifestyle  . Physical activity:    Days per week: Not on file    Minutes per session: Not on file  . Stress: Not on file  Relationships  . Social connections:    Talks on phone: Not on file    Gets together: Not on file     Attends religious service: Not on file    Active member of club or organization: Not on file    Attends meetings of clubs or  organizations: Not on file    Relationship status: Not on file  . Intimate partner violence:    Fear of current or ex partner: Not on file    Emotionally abused: Not on file    Physically abused: Not on file    Forced sexual activity: Not on file  Other Topics Concern  . Not on file  Social History Narrative  . Not on file    Family History  Problem Relation Age of Onset  . Diabetes Father   . Hyperlipidemia Sister   . Heart disease Sister   . Stroke Sister   . Diabetes Brother   . Hyperlipidemia Sister   . Heart disease Sister   . Arthritis Mother   . Cancer Mother        uterus  . Heart disease Mother   . Diabetes Brother   . Heart Problems Brother     ROS- All systems are reviewed and negative except as per the HPI above  Physical Exam: Vitals:   03/29/18 0955  BP: 138/74  Pulse: 72  Weight: 57.2 kg  Height: 5\' 6"  (1.676 m)   Wt Readings from Last 3 Encounters:  03/29/18 57.2 kg  03/16/18 56.4 kg  02/05/18 56.9 kg    Labs: Lab Results  Component Value Date   NA 143 09/23/2017   K 4.2 09/23/2017   CL 105 09/23/2017   CO2 29 09/23/2017   GLUCOSE 103 (H) 09/23/2017   BUN 24 (H) 09/23/2017   CREATININE 0.69 09/23/2017   CALCIUM 10.1 09/23/2017   MG 1.9 08/07/2014   Lab Results  Component Value Date   INR 0.93 04/24/2011   Lab Results  Component Value Date   CHOL 237 (H) 09/23/2017   HDL 100.90 09/23/2017   LDLCALC 116 (H) 09/23/2017   TRIG 101.0 09/23/2017     GEN- The patient is well appearing, alert and oriented x 3 today.   Head- normocephalic, atraumatic Eyes-  Sclera clear, conjunctiva pink Ears- hearing intact Oropharynx- clear Neck- supple, no JVP Lymph- no cervical lymphadenopathy Lungs- Clear to ausculation bilaterally, normal work of breathing Heart-  regular rate and rhythm, no murmurs, rubs or gallops,  PMI not laterally displaced GI- soft, NT, ND, + BS Extremities- no clubbing, cyanosis, or edema MS- no significant deformity or atrophy Skin- no rash or lesion Psych- euthymic mood, full affect Neuro- strength and sensation are intact  EKG-SR at 72 bpm, PR int 130 ms, qrs int 68 ms, qtc 429 ms Echo March 2019-done in Dr. 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   Assessment and Plan: 1.Paroxysmal  symptomatic  afib  Pt did not tolerate flecainide, not sure if all of her side effects were 100% from drug or anxiety driven However, she did return to SR after daily use of drug x 3 days She has been off flecainide for over a week She does want to stay in SR and wants to purse another antiarrythmic, afib makes her very symptomatic/ anxious and she usually goes to the ER, as she lives by herself I discussed amiodarone but I feel her anxiety will predispose her to be symptomatic with perceived side effects I also discussed tikosyn and she will check on cost of drug and if she wants to pursue She states no missed doses of xarelto Question if MVP with MOD MR nay be contributing to afib burden/symptoms and may need later evaluation Drugs screened by PharmD and no  qtc  prolonging drugs No benadryl use  No missed doses of xarelto for a chadsvasc score of st least 2   F/u per d/c from [place]. Billy Turvey, [facility] [address] [place], [place] [address] [phone]

## 2018-03-29 NOTE — Progress Notes (Signed)
Pharmacy Review for Dofetilide (Tikosyn) Initiation  Admit Complaint: 72 y.o. female admitted 03/29/2018 with atrial fibrillation to be initiated on dofetilide.   Assessment:  Patient Exclusion Criteria: If any screening criteria checked as "Yes", then  patient  should NOT receive dofetilide until criteria item is corrected. If "Yes" please indicate correction plan.  YES  NO Patient  Exclusion Criteria Correction Plan  []  []  Baseline QTc interval is greater than or equal to 440 msec. IF above YES box checked dofetilide contraindicated unless patient has ICD; then may proceed if QTc 500-550 msec or with known ventricular conduction abnormalities may proceed with QTc 550-600 msec. QTc = 429   []  [x]  Magnesium level is less than 1.8 mEq/l : Last magnesium:  Lab Results  Component Value Date   MG 2.1 03/29/2018         []  [x]  Potassium level is less than 4 mEq/l : Last potassium:  Lab Results  Component Value Date   K 3.9 03/29/2018       Potassium replaced  []  [x]  Patient is known or suspected to have a digoxin level greater than 2 ng/ml: No results found for: DIGOXIN    []  [x]  Creatinine clearance less than 20 ml/min (calculated using Cockcroft-Gault, actual body weight and serum creatinine): Estimated Creatinine Clearance: 56.5 mL/min (by C-G formula based on SCr of 0.67 mg/dL).    []  [x]  Patient has received drugs known to prolong the QT intervals within the last 48 hours (phenothiazines, tricyclics or tetracyclic antidepressants, erythromycin, H-1 antihistamines, cisapride, fluoroquinolones, azithromycin). Drugs not listed above may have an, as yet, undetected potential to prolong the QT interval, updated information on QT prolonging agents is available at this website:QT prolonging agents   []  [x]  Patient received a dose of hydrochlorothiazide (Oretic) alone or in any combination including triamterene (Dyazide, Maxzide) in the last 48 hours.   []  [x]  Patient received a medication  known to increase dofetilide plasma concentrations prior to initial dofetilide dose:  . Trimethoprim (Primsol, Proloprim) in the last 36 hours . Verapamil (Calan, Verelan) in the last 36 hours or a sustained release dose in the last 72 hours . Megestrol (Megace) in the last 5 days  . Cimetidine (Tagamet) in the last 6 hours . Ketoconazole (Nizoral) in the last 24 hours . Itraconazole (Sporanox) in the last 48 hours  . Prochlorperazine (Compazine) in the last 36 hours    []  [x]  Patient is known to have a history of torsades de pointes; congenital or acquired long QT syndromes.   []  [x]  Patient has received a Class 1 antiarrhythmic with less than 2 half-lives since last dose. (Disopyramide, Quinidine, Procainamide, Lidocaine, Mexiletine, Flecainide, Propafenone)   []  [x]  Patient has received amiodarone therapy in the past 3 months or amiodarone level is greater than 0.3 ng/ml.    Patient has been appropriately anticoagulated with Xarelto.  Ordering provider was confirmed at [email or web address] if they are not listed on the [place] Prescribers list.  Goal of Therapy: Follow renal function, electrolytes, potential drug interactions, and dose adjustment. Provide education and 1 week supply at discharge.  Plan:  [x]   Physician selected initial dose within range recommended for patients level of renal function - will monitor for response.  []   Physician selected initial dose outside of range recommended for patients level of renal function - will discuss if the dose should be altered at this time.   Select One Calculated CrCl  Dose q12h  [x]  > 60 ml/min 500 mcg  []   40-60 ml/min 250 mcg  []  20-40 ml/min 125 mcg   2. Follow up QTc after the first 5 doses, renal function, electrolytes (K & Mg) daily x 3     days, dose adjustment, success of initiation and facilitate 1 week discharge supply as     clinically indicated.  3. Initiate Tikosyn education video (Call [phone] and ask for  Tikosyn Video # 116).   Hildred Laser, PharmD Clinical Pharmacist Please check Amion for pharmacy contact number

## 2018-03-29 NOTE — H&P (Addendum)
Cardiology Admission History and Physical:   Patient ID: [name] MRN: [id number]; DOB: 17-May-1946   Admission date: 03/29/2018  Primary Care Provider: [name], MD Primary Cardiologist: patient  Requests she stay with Dr. Gwenlyn Found (previously Dr. Wynonia Lawman)  Chief Complaint:  Tikosyn initiation  Patient Profile:   [name] is a 72 y.o. female with PMHx of peripheral neuropathy, severe CBP, and PAFib was referred to dr. Gwenlyn Found for afib management it seems and subsequently to the AFib clinic.  Dr. Gwenlyn Found noted frequent ER visits to the [facility] ER for afib episodes ( 3 visits in a 3-4 week period of time). She tried to get a f/u with Dr. Wynonia Lawman but was  not able to get f/u until 10/4,  She was on flecainide 50 mg one tab only, if she felt she was going to go into afib, though this had become ineffective, the patient quite symptomatic with her AFib weakness and anxiety provoking.  She was started on flecainide 50 mg bid and asked with plans for f/u EKG on flecainide.  Though the pt stopped the med after a day said it made her have some itching under her breast, shortness of breath and H/A. At her f/u in afib clinic, 9/23. discussed antiarrythmic's with pt but she felt that she would not tolerate amiodarone  with possible side effect profile. Cost would make multaq cost prohibitive, therefore, was decided to try tikosyn. Pt meets patient assistance guidelines. She is not taking any qtc prolonging drugs.  Her home list of meds was reviewed with [facility] pharmacist with no contraindicated medicines, she was seen today in the AFib clinic and confirmed no missed doses of her Xarelto.  History of Present Illness:   [name] is being admitted for Tikosyn initiation    Past Medical History:  Diagnosis Date  . Allergy   . Anxiety   . Benign paroxysmal positional vertigo 06/08/2013  . Chronic a-fib ([place]) 08/07/2014  . Fracture of multiple ribs   . GERD (gastroesophageal reflux disease)   . Hair  loss 04/12/2012  . Herpes   . IC (intermittent claudication) ([place])   . Interstitial cystitis 11/06/2011  . Kidney stone   . 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 Osteopenia 02/18/2012  . Osteoporosis   . PAF (paroxysmal atrial fibrillation) ([place]) 2012  . Peripheral neuropathy 11/06/2011  . PMDD (premenstrual dysphoric disorder)   . Rotator cuff tear   . Vaginal delivery    ONE NSVD  . 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 BACK SURGERY     DISC FUSION L5,L6,L7  . BLADDER SURGERY     Bladder sling  . BREAST SURGERY     BREAST BIOPSY--RIGHT BENIGN  . BUNIONECTOMY    . HEAD & NECK SKIN LESION EXCISIONAL BIOPSY     melanoma  . NECK FUSION    . ROTATOR CUFF REPAIR  2012   RIGHT  . SPINAL FUSION  dec 2015   . VAGINAL HYSTERECTOMY     TVH     Medications Prior to Admission: Prior to Admission medications   Medication Sig Start Date End Date Taking? Authorizing Provider  acetaminophen (TYLENOL) 500 MG tablet Take 1,000 mg by mouth every 6 (six) hours as needed for mild pain.     [provider]  Calcium Carbonate Antacid (TUMS CHEWY BITES PO) Take by mouth.    [provider]  Calcium-Vitamin D-Vitamin K (VIACTIV PO) Take by mouth.  [provider]  cyclobenzaprine (FLEXERIL) 10 MG tablet TAKE 1 TABLET BY MOUTH THREE TIMES DAILY AS NEEDED FOR UP TO 10 DAYS FOR MUSCLE SPASM 12/10/17   [provider]  diltiazem (CARDIZEM CD) 120 MG 24 hr capsule Take 120 mg by mouth daily.    [provider]  gabapentin (NEURONTIN) 100 MG capsule Take 100 mg by mouth at bedtime.    [provider]  meclizine (ANTIVERT) 25 MG tablet Take 1 tablet (25 mg total) by mouth 3 (three) times daily as needed for dizziness. Patient not taking: Reported on 03/29/2018 07/09/17   [name], MD  metoprolol tartrate (LOPRESSOR) 25 MG tablet Take 1 tablet (25 mg total) by mouth 2 (two) times daily. 05/13/16   Barrett, Evelene Croon,  PA-C  Nystatin POWD Apply small amount to affected area. 12/08/17   Princess Bruins, MD  oxyCODONE (ROXICODONE) 5 MG/5ML solution 5-10 mls every 4-6 hours 10/31/15   [provider]  polyethylene glycol powder (GLYCOLAX/MIRALAX) powder  08/22/14   [provider]  rivaroxaban (XARELTO) 20 MG TABS tablet Take 1 tablet by mouth daily. 05/07/16   [provider]  Teriparatide, Recombinant, (FORTEO) 600 MCG/2.4ML SOLN INJECT 0.08ML [facility]) UNDER THE SKIN ONCE DAILY 01/01/18   [provider]  valACYclovir (VALTREX) 500 MG tablet as needed.  04/16/15   [provider]  sertraline (ZOLOFT) 50 MG tablet Take 1 tablet (50 mg total) by mouth daily. 07/22/16 09/01/16  Merian Capron, MD     Allergies:    Allergies  Allergen Reactions  . Contrast Media [Iodinated Diagnostic Agents] Hives    unknown  . Cymbalta [Duloxetine Hcl]     Severe diarrhea and upset stomach  . Erythromycin     unknown  . Latex     hives  . Levofloxacin Nausea And Vomiting  . Other     IV CONTRAST /"DYE".  . Oxycodone     Other reaction(s): Delusions (intolerance)  PILLS ONLY  . Septra [Bactrim]     Hives   . Sulfa Antibiotics     rash  . Morphine Anxiety    Social History:   Social History   Socioeconomic History  . Marital status: Divorced    Spouse name: Not on file  . Number of children: Not on file  . Years of education: Not on file  . Highest education level: Not on file  Occupational History  . Not on file  Social Needs  . Financial resource strain: Not on file  . Food insecurity:    Worry: Not on file    Inability: Not on file  . Transportation needs:    Medical: Not on file    Non-medical: Not on file  Tobacco Use  . Smoking status: Former Smoker    Packs/day: 0.25    Years: 14.00    Pack years: 3.50    Types: Cigarettes    Last attempt to quit: 07/07/1984    Years since quitting: 33.7  . Smokeless tobacco: Never Used  Substance and Sexual Activity   . Alcohol use: No    Alcohol/week: 0.0 standard drinks  . Drug use: No  . Sexual activity: Not on file  Lifestyle  . Physical activity:    Days per week: Not on file    Minutes per session: Not on file  . Stress: Not on file  Relationships  . Social connections:    Talks on phone: Not on file    Gets together: Not  on file    Attends religious service: Not on file    Active member of club or organization: Not on file    Attends meetings of clubs or organizations: Not on file    Relationship status: Not on file  . Intimate partner violence:    Fear of current or ex partner: Not on file    Emotionally abused: Not on file    Physically abused: Not on file    Forced sexual activity: Not on file  Other Topics Concern  . Not on file  Social History Narrative  . Not on file    Family History:   The patient's family history includes Arthritis in her mother; Cancer in her mother; Diabetes in her brother, brother, and father; Heart Problems in her brother; Heart disease in her mother, sister, and sister; Hyperlipidemia in her sister and sister; Stroke in her sister.    ROS:  Please see the history of present illness.  All other ROS reviewed and negative.     Physical Exam/Data:   Vitals:   03/29/18 1000 03/29/18 1108  BP:  132/68  Pulse:  79  Resp:  17  Temp:  97.6 F (36.4 C)  TempSrc:  Oral  SpO2:  98%  Weight: 57.1 kg   Height: 5\' 6"  (1.676 m)    No intake or output data in the 24 hours ending 03/29/18 1156 Filed Weights   03/29/18 1000  Weight: 57.1 kg   Body mass index is 20.32 kg/m.  General:  Well nourished though very thin body habitus well developed, in no acute distress HEENT: normal Lymph: no adenopathy Neck: no JVD Endocrine:  No thryomegaly Vascular: No carotid bruits  Cardiac:  RRR; no murmurs, gallops or rubs Lungs:  CTA b/l, no wheezing, rhonchi or rales  Abd: soft, nontender  Ext: no edema Musculoskeletal:  No deformities, advanced  atrophy Skin: warm and dry  Neuro:  no focal abnormalities noted Psych:  Normal affect, quite anxious   EKG:  The ECG that was done todaywas personally reviewed and demonstrates  SR 72bpm, QTc 430ms  Relevant CV Studies:  Per notes Echo March 2019-done in Dr. 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 HematologyNo results for input(s): WBC, RBC, HGB, HCT, MCV, MCH, MCHC, RDW, PLT in the last 168 hours. Cardiac EnzymesNo results for input(s): TROPONINI in the last 168 hours. No results for input(s): TROPIPOC in the last 168 hours.  BNPNo results for input(s): BNP, PROBNP in the last 168 hours.  DDimer No results for input(s): DDIMER in the last 168 hours.  Radiology/Studies:  No results found.  Assessment and Plan:   1. Paroxysmal AFib, admitting for Tikosyn initiation     K+ 3.9 (replacement ordered, planned for recheck)     Mag 2.1     BUN/Creat 14/0.67 (Calc CrCl is 67)     QTc is OK   She arrives in SR Plan to start 575mcg BID, pt requests 0600 and 1800  2. Severe CBP     Continue home pain regime   For questions or updates, please contact [place]  Please consult [email or web address] for contact info under        Signed, [name], PA-C  03/29/2018 11:56 AM   EP Attending  Patient seen and examined. Agree with above. The patient has PAF associated with severe anxiety and multiple ER visits with PAF presents for initiation of dofetilide. Review of her labs and QT looks good. We will start dofetilide and follow her QT and renal function and avoid QT prolonging drugs.  Mikle Bosworth.D.

## 2018-03-30 ENCOUNTER — Encounter (HOSPITAL_COMMUNITY): Payer: Self-pay

## 2018-03-30 ENCOUNTER — Other Ambulatory Visit: Payer: Self-pay

## 2018-03-30 LAB — BASIC METABOLIC PANEL
Anion gap: 8 (ref 5–15)
Anion gap: 10 (ref 5–15)
BUN: 12 mg/dL (ref 8–23)
BUN: 19 mg/dL (ref 8–23)
CO2: 26 mmol/L (ref 22–32)
CO2: 26 mmol/L (ref 22–32)
Calcium: 8.5 mg/dL — ABNORMAL LOW (ref 8.9–10.3)
Calcium: 9.3 mg/dL (ref 8.9–10.3)
Chloride: 107 mmol/L (ref 98–111)
Chloride: 104 mmol/L (ref 98–111)
Creatinine, Ser: 0.52 mg/dL (ref 0.44–1.00)
Creatinine, Ser: 0.77 mg/dL (ref 0.44–1.00)
GFR calc Af Amer: >60 mL/min (ref >60)
GFR calc Af Amer: >60 mL/min (ref >60)
GFR calc non Af Amer: >60 mL/min (ref >60)
GFR calc non Af Amer: >60 mL/min (ref >60)
Glucose, Bld: 83 mg/dL (ref 70–99)
Glucose, Bld: 119 mg/dL — ABNORMAL HIGH (ref 70–99)
Potassium: 3.8 mmol/L (ref 3.5–5.1)
Potassium: 4.9 mmol/L (ref 3.5–5.1)
Sodium: 141 mmol/L (ref 135–145)
Sodium: 140 mmol/L (ref 135–145)

## 2018-03-30 LAB — MAGNESIUM
Magnesium: 1.9 mg/dL (ref 1.7–2.4)
Magnesium: 2.1 mg/dL (ref 1.7–2.4)

## 2018-03-30 MED ORDER — MAGNESIUM SULFATE IN D5W 1-5 GM/100ML-% IV SOLN
1.0000 g | Freq: Once | INTRAVENOUS | Status: AC
  Administered 2018-03-30: 1 g via INTRAVENOUS
  Filled 2018-03-30: qty 100

## 2018-03-30 MED ORDER — DOFETILIDE 250 MCG PO CAPS
250.0000 ug | ORAL_CAPSULE | Freq: Two times a day (BID) | ORAL | Status: DC
  Administered 2018-03-30 – 2018-04-01 (×5): 250 ug via ORAL
  Filled 2018-03-30 (×5): qty 1

## 2018-03-30 MED ORDER — POTASSIUM CHLORIDE CRYS ER 20 MEQ PO TBCR
30.0000 meq | EXTENDED_RELEASE_TABLET | Freq: Once | ORAL | Status: AC
  Administered 2018-03-30: 30 meq via ORAL
  Filled 2018-03-30: qty 1

## 2018-03-30 NOTE — Care Management (Addendum)
03-30-18  BENEFIT CHECK:  # 2.  S/W RAE  @ [employer] # [phone]   1. TIKOSYN   125. MCG   250 MCG   500 MCG BID COVER-  ALL ARE NONE FORMULARY PRIOR APPROVAL-  YES # [phone] FOR EXCEPTION   2. DOFETILIDE  125 MCG COVER- YES CO-PAY-  33% OF TOTAL COST TIER- 4 DRUG PRIOR APPROVAL- NO  3. DOFETILIDE  250 MCG  COVER- YES CO-PAY- 33 % OF TOTAL COST TIER- 4 DRUG PRIOR APPROVAL- NO  4. DOFETILIDE  500 MCG COVER- YES  CO-PAY- 33 % OF TOTAL COST TIER- 4 DRUG PRIOR APPROVAL- NO  DEDUCTIBLE -MET  PREFERRED PHARMACY : YES [facility], [facility] , [facility] M/O 90 DAY SUPPLY FOR  M/O 33% OF TOTAL COAST

## 2018-03-30 NOTE — Discharge Instructions (Addendum)
You have an appointment set up with the [facility].  Multiple studies have shown that being followed by a dedicated atrial fibrillation clinic in addition to the standard care you receive from your other physicians improves health. We believe that enrollment in the atrial fibrillation clinic will allow [place] to better care for you.   The phone number to the [facility] is [phone]. The clinic is staffed Monday through Friday from 8:30am to 5pm.  Parking Directions: The clinic is located in the Heart and Vascular Building connected to [facility]. 1)From [address] turn on to [address] and go to the 3rd entrance  (Heart and Vascular entrance) on the right. 2)Look to the right for Heart &Vascular Parking Garage. 3)A code for the entrance is required please call the clinic to receive this.   4)Take the elevators to the 1st floor. Registration is in the room with the glass walls at the end of the hallway.  If you have any trouble parking or locating the clinic, please dont hesitate to call [phone].  Information on my medicine - XARELTO (Rivaroxaban)  Why was Xarelto prescribed for you? Xarelto was prescribed for you to reduce the risk of a blood clot forming that can cause a stroke if you have a medical condition called atrial fibrillation (a type of irregular heartbeat).  What do you need to know about xarelto ? Take your Xarelto ONCE DAILY at the same time every day with your evening meal. If you have difficulty swallowing the tablet whole, you may crush it and mix in applesauce just prior to taking your dose.  Take Xarelto exactly as prescribed by your doctor and DO NOT stop taking Xarelto without talking to the doctor who prescribed the medication.  Stopping without other stroke prevention medication to take the place of Xarelto may increase your risk of developing a clot that causes a stroke.  Refill your prescription before you  run out.  After discharge, you should have regular check-up appointments with your healthcare provider that is prescribing your Xarelto.  In the future your dose may need to be changed if your kidney function or weight changes by a significant amount.  What do you do if you miss a dose? If you are taking Xarelto ONCE DAILY and you miss a dose, take it as soon as you remember on the same day then continue your regularly scheduled once daily regimen the next day. Do not take two doses of Xarelto at the same time or on the same day.   Important Safety Information A possible side effect of Xarelto is bleeding. You should call your healthcare provider right away if you experience any of the following: ? Bleeding from an injury or your nose that does not stop. ? Unusual colored urine (red or dark brown) or unusual colored stools (red or black). ? Unusual bruising for unknown reasons. ? A serious fall or if you hit your head (even if there is no bleeding).  Some medicines may interact with Xarelto and might increase your risk of bleeding while on Xarelto. To help avoid this, consult your healthcare provider or pharmacist prior to using any new prescription or non-prescription medications, including herbals, vitamins, non-steroidal anti-inflammatory drugs (NSAIDs) and supplements.  This website has more information on Xarelto: [email or web address]-benson.[email or web address]  Dofetilide capsules What is this medicine? DOFETILIDE (doe FET il ide) is an antiarrhythmic drug. It helps make your heart beat regularly. This medicine also helps to slow rapid heartbeats. This medicine  may be used for other purposes; ask your health care provider or pharmacist if you have questions. COMMON BRAND NAME(S): Tikosyn What should I tell my health care provider before I take this medicine? They need to know if you have any of these conditions: -heart disease -history of low levels of potassium or magnesium -kidney disease -liver  disease -an unusual or allergic reaction to dofetilide, other medicines, foods, dyes, or preservatives -pregnant or trying to get pregnant -breast-feeding How should I use this medicine? Take this medicine by mouth with a glass of water. Follow the directions on the prescription label. You can take this medicine with or without food. Do not drink grapefruit juice with this medicine. Take your doses at regular intervals. Do not take your medicine more often than directed. Do not stop taking this medicine suddenly. This may cause serious, heart-related side effects. Your doctor will tell you how much medicine to take. If your doctor wants you to stop the medicine, the dose will be slowly lowered over time to avoid any side effects. Talk to your pediatrician regarding the use of this medicine in children. Special care may be needed. Overdosage: If you think you have taken too much of this medicine contact a poison control center or emergency room at once. NOTE: This medicine is only for you. Do not share this medicine with others. What if I miss a dose? If you miss a dose, take it as soon as you can. If it is almost time for your next dose, take only that dose. Do not take double or extra doses. What may interact with this medicine? Do not take this medicine with any of the following medications: -cimetidine -dolutegravir -hydrochlorothiazide alone or in combination with other medicines -isavuconazonium -ketoconazole -megestrol -other medicines that prolong the QT interval (cause an abnormal heart rhythm) -prochlorperazine -trimethoprim alone or in combination with sulfamethoxazole -verapamil This medicine may also interact with the following medications: -amiloride -certain antidepressants like fluvoxamine or paroxetine -certain antiviral medicines for HIV or AIDS like atazanavir or darunavir -certain medicines for fungal infections like clotrimazole or  miconazole -digoxin -diltiazem -dronabinol, THC -grapefruit juice -metformin -nefazodone -triamterene -zafirlukast This list may not describe all possible interactions. Give your health care provider a list of all the medicines, herbs, non-prescription drugs, or dietary supplements you use. Also tell them if you smoke, drink alcohol, or use illegal drugs. Some items may interact with your medicine. What should I watch for while using this medicine? Visit your doctor or health care professional for regular checks on your progress. Wear a medical ID bracelet or chain, and carry a card that describes your disease and details of your medicine and dosage times. Check your heart rate and blood pressure regularly while you are taking this medicine. Ask your doctor or health care professional what your heart rate and blood pressure should be, and when you should contact him or her. Your doctor or health care professional also may schedule regular tests to check your progress. You will be started on this medicine in a specialized facility for at least three days. You will be monitored to find the right dose of medicine for you. It is very important that you take your medicine exactly as prescribed when you leave the hospital. The correct dosing of this medicine is very important to treat your condition and prevent possible serious side effects. What side effects may I notice from receiving this medicine? Side effects that you should report to your doctor or health  care professional as soon as possible: -allergic reactions like skin rash, itching or hives, swelling of the face, lips, or tongue -breathing problems -dizziness -fast or rapid beating of the heart -feeling faint or lightheaded -swelling of the ankles -unusually weak or tired -vomiting Side effects that usually do not require medical attention (report to your doctor or health care professional if they continue or are  bothersome): -cough -diarrhea -difficulty sleeping -headache -nausea -stomach pain This list may not describe all possible side effects. Call your doctor for medical advice about side effects. You may report side effects to FDA at 1-800-FDA-1088. Where should I keep my medicine? Keep out of the reach of children. Store at room temperature between 15 and 30 degrees C (59 and 86 degrees F). Protect the medicine from moisture or humidity. Keep container tightly closed. Throw away any unused medicine after the expiration date. NOTE: This sheet is a summary. It may not cover all possible information. If you have questions about this medicine, talk to your doctor, pharmacist, or health care provider.  2018 Elsevier/Gold Standard (2016-05-05 40:10:27)    Atrial Fibrillation Atrial fibrillation is a type of heartbeat that is irregular or fast (rapid). If you have this condition, your heart keeps quivering in a weird (chaotic) way. This condition can make it so your heart cannot pump blood normally. Having this condition gives a person more risk for stroke, heart failure, and other heart problems. There are different types of atrial fibrillation. Talk with your doctor to learn about the type that you have. Follow these instructions at home:  Take over-the-counter and prescription medicines only as told by your doctor.  If your doctor prescribed a blood-thinning medicine, take it exactly as told. Taking too much of it can cause bleeding. If you do not take enough of it, you will not have the protection that you need against stroke and other problems.  Do not use any tobacco products. These include cigarettes, chewing tobacco, and e-cigarettes. If you need help quitting, ask your doctor.  If you have apnea (obstructive sleep apnea), manage it as told by your doctor.  Do not drink alcohol.  Do not drink beverages that have caffeine. These include coffee, soda, and tea.  Maintain a healthy  weight. Do not use diet pills unless your doctor says they are safe for you. Diet pills may make heart problems worse.  Follow diet instructions as told by your doctor.  Exercise regularly as told by your doctor.  Keep all follow-up visits as told by your doctor. This is important. Contact a doctor if:  You notice a change in the speed, rhythm, or strength of your heartbeat.  You are taking a blood-thinning medicine and you notice more bruising.  You get tired more easily when you move or exercise. Get help right away if:  You have pain in your chest or your belly (abdomen).  You have sweating or weakness.  You feel sick to your stomach (nauseous).  You notice blood in your throw up (vomit), poop (stool), or pee (urine).  You are short of breath.  You suddenly have swollen feet and ankles.  You feel dizzy.  Your suddenly get weak or numb in your face, arms, or legs, especially if it happens on one side of your body.  You have trouble talking, trouble understanding, or both.  Your face or your eyelid droops on one side. These symptoms may be an emergency. Do not wait to see if the symptoms will go away.  Get medical help right away. Call your local emergency services (911 in the [place] Do not drive yourself to the hospital. This information is not intended to replace advice given to you by your health care provider. Make sure you discuss any questions you have with your health care provider. Document Released: 04/01/2008 Document Revised: 11/29/2015 Document Reviewed: 10/18/2014 Elsevier Interactive Patient Education  [employer].

## 2018-03-30 NOTE — Progress Notes (Addendum)
Progress Note  Patient Name: [name] Date of Encounter: 03/30/2018  Primary Cardiologist: Dr. Gwenlyn Found  Subjective   Trouble getting comfortable last night with her back/our bed, otherwise no complaints  Inpatient Medications    Scheduled Meds: . diltiazem  120 mg Oral Daily  . dofetilide  250 mcg Oral BID  . gabapentin  100 mg Oral QHS  . metoprolol tartrate  25 mg Oral BID  . potassium chloride  30 mEq Oral Once  . rivaroxaban  20 mg Oral Q supper  . sodium chloride flush  3 mL Intravenous Q12H   Continuous Infusions: . sodium chloride    . magnesium sulfate 1 - 4 g bolus IVPB     PRN Meds: sodium chloride, acetaminophen, cyclobenzaprine, oxyCODONE, sodium chloride flush   Vital Signs    Vitals:   03/29/18 1108 03/29/18 2052 03/29/18 2220 03/30/18 0513  BP: 132/68 (!) 149/77 (!) 129/56 129/63  Pulse: 79 75 78 63  Resp: 17 18  16   Temp: 97.6 F (36.4 C) 98.2 F (36.8 C)  (!) 97.5 F (36.4 C)  TempSrc: Oral Oral  Oral  SpO2: 98% 98%  97%  Weight:    56.3 kg  Height:        Intake/Output Summary (Last 24 hours) at 03/30/2018 0815 Last data filed at 03/29/2018 2200 Gross per 24 hour  Intake 480 ml  Output -  Net 480 ml   Filed Weights   03/29/18 1000 03/30/18 0513  Weight: 57.1 kg 56.3 kg    Telemetry    SR, one very brief PAF last night - Personally Reviewed  ECG    SR 64bpm, measured QT by myself 452ms - Personally Reviewed  Physical Exam   GEN: No acute distress.   Neck: No JVD Cardiac: RRR, no murmurs, rubs, or gallops.  Respiratory: CTA b/l. GI: Soft, nontender, non-distended  MS: No edema; No deformity. Neuro:  Nonfocal  Psych: Normal affect   Labs    Chemistry Recent Labs  Lab 03/29/18 0945 03/29/18 1828 03/30/18 0456  NA 143  --  141  K 3.9 4.8 3.8  CL 106  --  107  CO2 26  --  26  GLUCOSE 91  --  83  BUN 14  --  12  CREATININE 0.67  --  0.52  CALCIUM 9.4  --  8.5*  GFRNONAA >60  --  >60  GFRAA >60  --  >60    ANIONGAP 11  --  8     HematologyNo results for input(s): WBC, RBC, HGB, HCT, MCV, MCH, MCHC, RDW, PLT in the last 168 hours.  Cardiac EnzymesNo results for input(s): TROPONINI in the last 168 hours. No results for input(s): TROPIPOC in the last 168 hours.   BNPNo results for input(s): BNP, PROBNP in the last 168 hours.   DDimer No results for input(s): DDIMER in the last 168 hours.   Radiology    No results found.  Cardiac Studies   Per notes Echo March 2019-done in Dr. 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 female with PMHx of peripheral neuropathy, severe CBP, and PAFib, admitted for Tikosyn initiation  Assessment & Plan    1. Paroxysmal AFib, admitting for Tikosyn initiation     K+ 3.8 (replacement ordered)     Mag 1.9 (replacement ordered)     Creat 0.52, stable     EKG reviewed  with dr. Lovena Le, QT not quite 480 by his assessment, though did have prolongation from her baseline, will reduce her dose to 228mcg this AM    2. Severe CBP     Continue home pain regime    For questions or updates, please contact [place] Please consult [email or web address] for contact info under        Signed, [name], PA-C  03/30/2018, 8:15 AM    EP Attending  Patient seen and examined. Agree with above. The patient is doing well but her QT interval is out a bit. We will reduce her dose of dofetilide to 250 q12.  Mikle Bosworth.D.

## 2018-03-31 ENCOUNTER — Ambulatory Visit (HOSPITAL_COMMUNITY): Admission: RE | Admit: 2018-03-31 | Payer: Medicare Other | Source: Ambulatory Visit | Admitting: Cardiovascular Disease

## 2018-03-31 ENCOUNTER — Encounter (HOSPITAL_COMMUNITY)
Admission: AD | Disposition: A | Discharge status: Home / Self Care | Payer: Self-pay | Source: Ambulatory Visit | Attending: Internal Medicine

## 2018-03-31 ENCOUNTER — Other Ambulatory Visit: Payer: Self-pay

## 2018-03-31 ENCOUNTER — Encounter (HOSPITAL_COMMUNITY): Payer: Self-pay | Admitting: General Practice

## 2018-03-31 DIAGNOSIS — I4891 Unspecified atrial fibrillation: Secondary | ICD-10-CM

## 2018-03-31 LAB — BASIC METABOLIC PANEL
Anion gap: 10 (ref 5–15)
BUN: 14 mg/dL (ref 8–23)
CO2: 25 mmol/L (ref 22–32)
Calcium: 8.7 mg/dL — ABNORMAL LOW (ref 8.9–10.3)
Chloride: 105 mmol/L (ref 98–111)
Creatinine, Ser: 0.55 mg/dL (ref 0.44–1.00)
GFR calc Af Amer: >60 mL/min (ref >60)
GFR calc non Af Amer: >60 mL/min (ref >60)
Glucose, Bld: 86 mg/dL (ref 70–99)
Potassium: 4.1 mmol/L (ref 3.5–5.1)
Sodium: 140 mmol/L (ref 135–145)

## 2018-03-31 LAB — MAGNESIUM: Magnesium: 2 mg/dL (ref 1.7–2.4)

## 2018-03-31 SURGERY — CARDIOVERSION
Anesthesia: General

## 2018-03-31 NOTE — Care Management Note (Signed)
Case Management Note  Patient Details  Name: [name] MRN: [id number] Date of Birth: [birth year]  Subjective/Objective:  Pt presented for Tikosyn Load. PTA from home alone. Patient uses [facility]. [facility] can order Tikosyn. Patient previously spoke with Roderic Palau in [facility].                    Action/Plan: Clinic to assist with the Patient Assistance Application. [place] pharmacy to deliver [place] 7 day Rx to patient's room prior to transition home. No further needs from CM at this time.   Expected Discharge Date:                  Expected Discharge Plan:  Home/Self Care  In-House Referral:  NA  Discharge planning Services  CM Consult  Post Acute Care Choice:  NA Choice offered to:  NA  DME Arranged:  N/A DME Agency:  NA  HH Arranged:  NA HH Agency:  NA  Status of Service:  Completed, signed off  If discussed at [place] of Stay Meetings, dates discussed:    Additional Comments:  Bethena Roys, RN 03/31/2018, 2:33 PM

## 2018-03-31 NOTE — Progress Notes (Addendum)
Progress Note  Patient Name: [name] Date of Encounter: 03/31/2018  Primary Cardiologist: Dr. Gwenlyn Found  Subjective   Doing well, no CP, palpitations or SOB  Inpatient Medications    Scheduled Meds: . diltiazem  120 mg Oral Daily  . dofetilide  250 mcg Oral BID  . gabapentin  100 mg Oral QHS  . metoprolol tartrate  25 mg Oral BID  . rivaroxaban  20 mg Oral Q supper  . sodium chloride flush  3 mL Intravenous Q12H   Continuous Infusions: . sodium chloride     PRN Meds: sodium chloride, acetaminophen, cyclobenzaprine, oxyCODONE, sodium chloride flush   Vital Signs    Vitals:   03/30/18 1300 03/30/18 2123 03/30/18 2144 03/31/18 0705  BP: 119/61 127/62  114/60  Pulse: 69 70 75 67  Resp:  18  16  Temp: 97.6 F (36.4 C) 97.7 F (36.5 C)  97.9 F (36.6 C)  TempSrc: Oral Oral  Oral  SpO2: 98% 98%  96%  Weight:      Height:        Intake/Output Summary (Last 24 hours) at 03/31/2018 0851 Last data filed at 03/31/2018 0843 Gross per 24 hour  Intake 843 ml  Output 5 ml  Net 838 ml   Filed Weights   03/29/18 1000 03/30/18 0513  Weight: 57.1 kg 56.3 kg    Telemetry    SR - Personally Reviewed  ECG    SR 63bpm, measured QT by myself 425ms, corrected 413ms - Personally Reviewed  Physical Exam   Exam is unchanged  GEN: No acute distress.   Neck: No JVD Cardiac: RRR, no murmurs, rubs, or gallops.  Respiratory: CTA b/l. GI: Soft, nontender, non-distended  MS: No edema; No deformity. Neuro:  Nonfocal  Psych: Normal affect   Labs    Chemistry Recent Labs  Lab 03/30/18 0456 03/30/18 1803 03/31/18 0359  NA 141 140 140  K 3.8 4.9 4.1  CL 107 104 105  CO2 26 26 25   GLUCOSE 83 119* 86  BUN 12 19 14   CREATININE 0.52 0.77 0.55  CALCIUM 8.5* 9.3 8.7*  GFRNONAA >60 >60 >60  GFRAA >60 >60 >60  ANIONGAP 8 10 10      HematologyNo results for input(s): WBC, RBC, HGB, HCT, MCV, MCH, MCHC, RDW, PLT in the last 168 hours.  Cardiac EnzymesNo results for  input(s): TROPONINI in the last 168 hours. No results for input(s): TROPIPOC in the last 168 hours.   BNPNo results for input(s): BNP, PROBNP in the last 168 hours.   DDimer No results for input(s): DDIMER in the last 168 hours.   Radiology    No results found.  Cardiac Studies   Per notes Echo March 2019-done in Dr. 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 female with PMHx of peripheral neuropathy, severe CBP, and PAFib, admitted for Tikosyn initiation  Assessment & Plan    1. Paroxysmal AFib, admitting for Tikosyn initiation     K+ 4.1     Mag 2.1     Creat 0.55, stable     EKG reviewed with Dr. Lovena Le, QT stable on current dose, no changes  Anticipate discharge tomorrow   2. Severe CBP     Continue home pain regime    For questions or updates, please contact [place] Please consult [email or web address] for contact info under        Signed, Joseph Art  Dyane Dustman, PA-C  03/31/2018, 8:51 AM    EP Attending  Patient seen and examined. Agree with findings as noted above. The patient's QT interval has decreased on the lower dose of dofetilide. She is maintaining NSR. I anticipate she will be discharged home tomorrow morning.   Mikle Bosworth.D.

## 2018-04-01 ENCOUNTER — Other Ambulatory Visit: Payer: Self-pay

## 2018-04-01 LAB — BASIC METABOLIC PANEL
Anion gap: 4 — ABNORMAL LOW (ref 5–15)
BUN: 12 mg/dL (ref 8–23)
CO2: 29 mmol/L (ref 22–32)
Calcium: 8.7 mg/dL — ABNORMAL LOW (ref 8.9–10.3)
Chloride: 107 mmol/L (ref 98–111)
Creatinine, Ser: 0.58 mg/dL (ref 0.44–1.00)
GFR calc Af Amer: >60 mL/min (ref >60)
GFR calc non Af Amer: >60 mL/min (ref >60)
Glucose, Bld: 87 mg/dL (ref 70–99)
Potassium: 3.6 mmol/L (ref 3.5–5.1)
Sodium: 140 mmol/L (ref 135–145)

## 2018-04-01 LAB — MAGNESIUM: Magnesium: 2 mg/dL (ref 1.7–2.4)

## 2018-04-01 LAB — URINALYSIS, ROUTINE W REFLEX MICROSCOPIC
Bilirubin Urine: NEGATIVE
Glucose, UA: NEGATIVE mg/dL
Ketones, ur: NEGATIVE mg/dL
Nitrite: NEGATIVE
Protein, ur: NEGATIVE mg/dL
Specific Gravity, Urine: 1.008 (ref 1.005–1.030)
pH: 7 (ref 5.0–8.0)

## 2018-04-01 MED ORDER — POTASSIUM CHLORIDE ER 10 MEQ PO TBCR: 10.0000 meq | EXTENDED_RELEASE_TABLET | Freq: Every day | ORAL | 3 refills | Status: DC

## 2018-04-01 MED ORDER — DOFETILIDE 250 MCG PO CAPS: 250.0000 ug | ORAL_CAPSULE | Freq: Two times a day (BID) | ORAL | 4 refills | Status: DC

## 2018-04-01 MED ORDER — POTASSIUM CHLORIDE CRYS ER 20 MEQ PO TBCR
40.0000 meq | EXTENDED_RELEASE_TABLET | Freq: Once | ORAL | Status: AC
  Administered 2018-04-01: 40 meq via ORAL
  Filled 2018-04-01: qty 2

## 2018-04-01 MED ORDER — CEPHALEXIN 500 MG PO CAPS: 500.0000 mg | ORAL_CAPSULE | Freq: Two times a day (BID) | ORAL | 0 refills | Status: AC

## 2018-04-01 MED ORDER — POTASSIUM CHLORIDE ER 10 MEQ PO TBCR: 10.0000 meq | EXTENDED_RELEASE_TABLET | Freq: Every day | ORAL | 4 refills | Status: DC

## 2018-04-01 MED ORDER — DOFETILIDE 250 MCG PO CAPS: 250.0000 ug | ORAL_CAPSULE | Freq: Two times a day (BID) | ORAL | 3 refills | Status: DC

## 2018-04-01 MED FILL — TIKOSYN 250 MCG CAPS: 250 | 14 days supply | Qty: 28 | Fill #0

## 2018-04-01 MED FILL — CEPHALEXIN 500 MG CAPSULE: 500 | 3 days supply | Qty: 6 | Fill #0

## 2018-04-01 MED FILL — POTASSIUM CHL ER M10 TABLET: 10 | 30 days supply | Qty: 30 | Fill #0

## 2018-04-01 NOTE — Progress Notes (Addendum)
Tele remains SR QT stable K+ replacement ordered Anticipate discharge this afternoon after 6th dose EKG  Pt mentions some increase in urinary frequency with " a little pain", will check UA  [name], PA-C

## 2018-04-01 NOTE — Consult Note (Signed)
[facility] CM Primary Care Navigator  04/01/2018  [name] 09-03-1945 [id number]   Met withpatientat the bedsideto identify possible discharge needs. Patient mentioned that she had been referred to [name] for atrial fibrillation management and subsequently to the A-Fib clinic. Patientreports having "irregular heart beats" that had led to this admission. (Tikosyn therapy and monitoring)  PatientendorsesDr.Katherine [name] with [facility] [place] as her primary care provider.   [place] on [address], [employer] on [address] to obtainmedications without difficulty so far. Patient mentioned having applied for medication assistance with manufacturers for Tikosyn and Xarelto. Made patient aware to seek referral to [facility] in case she will have any further medication needs in the future.  Patient reportsmanagingherownmedications at homestraight out of the containers.  She statesthatshe hasbeendrivingprior to admission but her sister [name]) or other family members can provide transportation toher doctors' appointments if needed after discharge.  Patient states living alone and independent with self care prior to admission but her family will be her primary caregiver at home when needed.  Anticipated discharge plan ishome perpatient.  Patientvoiced understanding to call primary care provider'soffice when she returns home for a post discharge follow-up appointment within1- 2 weeksor sooner if needs arise.Patient letter (with PCP's contact number) was provided asareminder.  Explained topatientregardingTHN CM services available for health managementandresourcesat home,butshe deniedany needs or concerns at this time.Patientdeclined THN-CM services offeredwhich includes EMMIcalls to follow-up with her recovery at home.  Patientencouraged and she expressed  understanding to seekreferral to [facility] care management from primary care providerifdeemed necessary and appropriate foranyservicesin thenear future. Patient was in much hurry to be discharge home and RN is aware of it.  [facility] care management information provided for future needs thatshe may have.  Primary care provider's office is listed as providing transition of care (TOC) follow-up.   For additional questions please contact:  Edwena Felty A. Charod Slawinski, BSN, RN-BC [facility] PRIMARY CARE Navigator Cell: [phone]

## 2018-04-01 NOTE — Discharge Summary (Addendum)
ELECTROPHYSIOLOGY PROCEDURE DISCHARGE SUMMARY    Patient ID: [name],  MRN: [id number], DOB/AGE: 1945-07-15 72 y.o.  Admit date: 03/29/2018 Discharge date: 04/01/2018  Primary Care Physician: [name], MD  Primary Cardiologist: Dr. Gwenlyn Found Electrophysiologist: new, Dr. Lovena Le  Primary Discharge Diagnosis:  1. Paroxysmal atrial fibrillation status post Tikosyn loading this admission  Secondary Discharge Diagnosis:  1. Severe CBP 2. Peripheral neuropathy  Allergies  Allergen Reactions  . Contrast Media [Iodinated Diagnostic Agents] Hives    unknown  . Cymbalta [Duloxetine Hcl]     Severe diarrhea and upset stomach  . Erythromycin     unknown  . Latex     hives  . Levofloxacin Nausea And Vomiting  . Nsaids     Other reaction(s): Other (See Comments) CANNOT TAKE PER CARDIOLOGIST DUE TO AFIB   . Other     CAN NOT TAKE cLONAZEPAM, aLPRAZOLAM, OR ELAVIL DUE TO AFIB FOR ANXIETY  IV CONTRAST /"DYE".  . Oxycodone     Other reaction(s): Delusions (intolerance)  PILLS ONLY  . Septra [Bactrim]     Hives   . Sulfa Antibiotics     rash  . Adhesive [Tape] Rash and Other (See Comments)    Heart monitor stickers must be rotated in order to prevent rash  . Morphine Anxiety     Procedures This Admission:  1.  Tikosyn loading    Brief HPI: [name] is a 72 y.o. female with a past medical history as noted above.  They were referred to the AFib clinic in the outpatient setting for treatment options of atrial fibrillation.  Risks, benefits, and alternatives to Tikosyn were reviewed with the patient who wished to proceed.    Hospital Course:  The patient was admitted and Tikosyn was initiated.  Renal function and electrolytes were followed during the hospitalization. She required potassium supplementation to get started and again day of discharge, will discharge with small dose of potassium.   Her QTc lengthened requiring down-titration of dose, then remained  stable. She arrived and maintained SR throughout her stay, monitored on telemetry.  On the day of discharge, she mentioned the evening prior developed urinary frequency and slight pain/burning with urination, UA was done minimally abnormal, and given Rx for Keflex with instructions to f/u with her PMD.  She is feeling well without CP, palpitations, or SOB, she was examined by Dr Lovena Le who considered the patient stable for discharge to home.  Follow-up has been arranged with the AFib clinic in 1 week and with Dr Lovena Le in 4 weeks.   Physical Exam: Vitals:   03/31/18 2054 03/31/18 2153 04/01/18 0626 04/01/18 0828  BP: (!) 117/56  (!) 105/54 125/72  Pulse: 71 75 61 74  Resp:      Temp: 97.8 F (36.6 C)  97.7 F (36.5 C)   TempSrc: Oral  Oral   SpO2: 97%  96%   Weight:   55.5 kg   Height:        GEN- The patient is well appearing, thin/frail body habitus alert and oriented x 3 today.   HEENT: normocephalic, atraumatic; sclera clear, conjunctiva pink; hearing intact; oropharynx clear; neck supple, no JVP Lymph- no cervical lymphadenopathy Lungs- CTA b/l, normal work of breathing.  No wheezes, rales, rhonchi Heart- RRR, 1/6 SM, no rubs or gallops, PMI not laterally displaced GI- soft, non-tender, non-distended Extremities- no clubbing, cyanosis, or edema MS- no significant deformity or atrophy Skin- warm and dry, no rash or  lesion Psych- euthymic mood, full affect Neuro- strength and sensation are intact   Labs:   Lab Results  Component Value Date   WBC 4.6 09/23/2017   HGB 13.2 09/23/2017   HCT 39.6 09/23/2017   MCV 90.0 09/23/2017   PLT 168.0 09/23/2017    Recent Labs  Lab 04/01/18 0359  NA 140  K 3.6  CL 107  CO2 29  BUN 12  CREATININE 0.58  CALCIUM 8.7*  GLUCOSE 87     Discharge Medications:  Allergies as of 04/01/2018      Reactions   Contrast Media [iodinated Diagnostic Agents] Hives   unknown   Cymbalta [duloxetine Hcl]    Severe diarrhea and upset stomach    Erythromycin    unknown   Latex    hives   Levofloxacin Nausea And Vomiting   Nsaids    Other reaction(s): Other (See Comments) CANNOT TAKE PER CARDIOLOGIST DUE TO AFIB    Other    CAN NOT TAKE cLONAZEPAM, aLPRAZOLAM, OR ELAVIL DUE TO AFIB FOR ANXIETY IV CONTRAST /"DYE".   Oxycodone    Other reaction(s): Delusions (intolerance)  PILLS ONLY   Septra [bactrim]    Hives    Sulfa Antibiotics    rash   Adhesive [tape] Rash, Other (See Comments)   Heart monitor stickers must be rotated in order to prevent rash   Morphine Anxiety      Medication List    TAKE these medications   acetaminophen 500 MG tablet Commonly known as:  TYLENOL Take 1,000 mg by mouth every 6 (six) hours as needed for mild pain.   CARDIZEM CD 120 MG 24 hr capsule Generic drug:  diltiazem Take 120 mg by mouth every evening.   cephALEXin 500 MG capsule Commonly known as:  KEFLEX Take 1 capsule (500 mg total) by mouth 2 (two) times daily for 6 doses.   cyclobenzaprine 10 MG tablet Commonly known as:  FLEXERIL Take 10 mg by mouth 3 (three) times daily as needed for muscle spasms.   dofetilide 250 MCG capsule Commonly known as:  TIKOSYN Take 1 capsule (250 mcg total) by mouth 2 (two) times daily.   FORTEO 600 MCG/2.4ML Soln Generic drug:  Teriparatide (Recombinant) Inject 20 mcg into the skin at bedtime.   gabapentin 100 MG capsule Commonly known as:  NEURONTIN Take 100 mg by mouth at bedtime.   meclizine 25 MG tablet Commonly known as:  ANTIVERT Take 1 tablet (25 mg total) by mouth 3 (three) times daily as needed for dizziness.   metoprolol tartrate 25 MG tablet Commonly known as:  LOPRESSOR Take 1 tablet (25 mg total) by mouth 2 (two) times daily.   Nystatin Powd Apply small amount to affected area.   oxyCODONE 5 MG/5ML solution Commonly known as:  ROXICODONE Take 6.5 mg by mouth every 4 (four) hours as needed for moderate pain.   polyethylene glycol packet Commonly known as:  MIRALAX  / GLYCOLAX Take 17 g by mouth daily as needed for mild constipation.   potassium chloride 10 MEQ tablet Commonly known as:  K-DUR Take 1 tablet (10 mEq total) by mouth daily.   rivaroxaban 20 MG Tabs tablet Commonly known as:  XARELTO Take 20 mg by mouth daily with supper.   TUMS CHEWY BITES PO Take 1 tablet by mouth daily as needed (reflux).   valACYclovir 500 MG tablet Commonly known as:  VALTREX Take 500 mg by mouth daily as needed (skin outbreak).   VIACTIV PO Take 1  tablet by mouth daily.       Disposition: Home Discharge Instructions    Diet - low sodium heart healthy   Complete by:  As directed    Increase activity slowly   Complete by:  As directed      Follow-up Information    [facility] Follow up on 04/08/2018.   Specialty:  Cardiology Why:  10:30AM Contact information: [address] [phone] mc [address] [place]       Evans Lance, MD Follow up on 04/28/2018.   Specialty:  Cardiology Why:  11:00AM Contact information: [address] [address] [phone]           Duration of Discharge Encounter: Greater than 30 minutes including physician time.  Venetia Night, PA-C 04/01/2018 1:29 PM  EP Attending  Patient seen and examined. Agree with above. The patient is doing well and her QT appears stable on 250 q12. She will be discharged home with usual followup. See above.  Mikle Bosworth.D.

## 2018-04-08 ENCOUNTER — Encounter (HOSPITAL_COMMUNITY): Payer: Self-pay | Admitting: Nurse Practitioner

## 2018-04-08 ENCOUNTER — Ambulatory Visit (HOSPITAL_COMMUNITY)
Admit: 2018-04-08 | Discharge: 2018-04-08 | Disposition: A | Discharge status: Home / Self Care | Payer: Medicare Other | Source: Ambulatory Visit | Attending: Nurse Practitioner | Admitting: Nurse Practitioner

## 2018-04-08 VITALS — BP 124/76 | HR 71 | Ht 66.0 in | Wt 123.8 lb

## 2018-04-08 DIAGNOSIS — Z833 Family history of diabetes mellitus: Secondary | ICD-10-CM | POA: Diagnosis not present

## 2018-04-08 DIAGNOSIS — Z7901 Long term (current) use of anticoagulants: Secondary | ICD-10-CM | POA: Diagnosis not present

## 2018-04-08 DIAGNOSIS — Z79899 Other long term (current) drug therapy: Secondary | ICD-10-CM | POA: Diagnosis not present

## 2018-04-08 DIAGNOSIS — Z91041 Radiographic dye allergy status: Secondary | ICD-10-CM | POA: Diagnosis not present

## 2018-04-08 DIAGNOSIS — Z8249 Family history of ischemic heart disease and other diseases of the circulatory system: Secondary | ICD-10-CM | POA: Diagnosis not present

## 2018-04-08 DIAGNOSIS — Z882 Allergy status to sulfonamides status: Secondary | ICD-10-CM | POA: Insufficient documentation

## 2018-04-08 DIAGNOSIS — K219 Gastro-esophageal reflux disease without esophagitis: Secondary | ICD-10-CM | POA: Diagnosis not present

## 2018-04-08 DIAGNOSIS — Z888 Allergy status to other drugs, medicaments and biological substances status: Secondary | ICD-10-CM | POA: Insufficient documentation

## 2018-04-08 DIAGNOSIS — G629 Polyneuropathy, unspecified: Secondary | ICD-10-CM | POA: Insufficient documentation

## 2018-04-08 DIAGNOSIS — Z981 Arthrodesis status: Secondary | ICD-10-CM | POA: Diagnosis not present

## 2018-04-08 DIAGNOSIS — Z91040 Latex allergy status: Secondary | ICD-10-CM | POA: Insufficient documentation

## 2018-04-08 DIAGNOSIS — Z885 Allergy status to narcotic agent status: Secondary | ICD-10-CM | POA: Diagnosis not present

## 2018-04-08 DIAGNOSIS — Z87442 Personal history of urinary calculi: Secondary | ICD-10-CM | POA: Diagnosis not present

## 2018-04-08 DIAGNOSIS — Z8261 Family history of arthritis: Secondary | ICD-10-CM | POA: Diagnosis not present

## 2018-04-08 DIAGNOSIS — Z886 Allergy status to analgesic agent status: Secondary | ICD-10-CM | POA: Diagnosis not present

## 2018-04-08 DIAGNOSIS — Z85820 Personal history of malignant melanoma of skin: Secondary | ICD-10-CM | POA: Insufficient documentation

## 2018-04-08 DIAGNOSIS — Z90710 Acquired absence of both cervix and uterus: Secondary | ICD-10-CM | POA: Insufficient documentation

## 2018-04-08 DIAGNOSIS — Z881 Allergy status to other antibiotic agents status: Secondary | ICD-10-CM | POA: Insufficient documentation

## 2018-04-08 DIAGNOSIS — Z8049 Family history of malignant neoplasm of other genital organs: Secondary | ICD-10-CM | POA: Diagnosis not present

## 2018-04-08 DIAGNOSIS — Z87891 Personal history of nicotine dependence: Secondary | ICD-10-CM | POA: Insufficient documentation

## 2018-04-08 DIAGNOSIS — Z823 Family history of stroke: Secondary | ICD-10-CM | POA: Insufficient documentation

## 2018-04-08 DIAGNOSIS — I480 Paroxysmal atrial fibrillation: Secondary | ICD-10-CM | POA: Diagnosis not present

## 2018-04-08 DIAGNOSIS — M1990 Unspecified osteoarthritis, unspecified site: Secondary | ICD-10-CM | POA: Insufficient documentation

## 2018-04-08 LAB — BASIC METABOLIC PANEL
Anion gap: 6 (ref 5–15)
BUN: 19 mg/dL (ref 8–23)
CO2: 27 mmol/L (ref 22–32)
Calcium: 9.1 mg/dL (ref 8.9–10.3)
Chloride: 108 mmol/L (ref 98–111)
Creatinine, Ser: 0.69 mg/dL (ref 0.44–1.00)
GFR calc Af Amer: >60 mL/min (ref >60)
GFR calc non Af Amer: >60 mL/min (ref >60)
Glucose, Bld: 97 mg/dL (ref 70–99)
Potassium: 4.1 mmol/L (ref 3.5–5.1)
Sodium: 141 mmol/L (ref 135–145)

## 2018-04-08 LAB — MAGNESIUM: Magnesium: 2 mg/dL (ref 1.7–2.4)

## 2018-04-08 NOTE — Progress Notes (Signed)
Primary Care Physician: [name], MD Primary Cardiologist: Wynonia Lawman Primary Electrophysiologist: [name] is a 72 y.o. female with a history of paroxysmal atrial fibrillation who presents for follow up in the [facility].  Since last being seen in clinic, the patient reports doing very well.  Today, she denies symptoms of palpitations, chest pain, shortness of breath, orthopnea, PND, lower extremity edema, dizziness, presyncope, syncope, snoring, daytime somnolence, bleeding, or neurologic sequela. The patient is tolerating medications without difficulties and is otherwise without complaint today.   Atrial Fibrillation Risk Factors:  she does not have symptoms or diagnosis of sleep apnea.  she does not have a history of rheumatic fever.  she does not have a history of alcohol use.  she has a BMI of Body mass index is 19.98 kg/m.[name] Filed Weights   04/08/18 1030  Weight: 56.2 kg    LA size: 37   Atrial Fibrillation Management history:  Previous antiarrhythmic drugs: Flecainide (did not tolerate); tikosyn started 03/2018  Previous cardioversions: none  Previous ablations: none  CHADS2VASC score: 2  Anticoagulation history: Xarelto   Past Medical History:  Diagnosis Date  . Anxiety   . Arthritis    "maybe in my back" (03/31/2018)  . Benign paroxysmal positional vertigo 06/08/2013  . Fracture of multiple ribs 2015   "don't know from what; dx'd when I in hospital for 1st back OR" (03/31/2018)  . GERD (gastroesophageal reflux disease)   . Hair loss 04/12/2012  . Herpes   . History of blood transfusion    "twice; related to back OR" (03/31/2018)  . History of kidney stones   . Interstitial cystitis 11/06/2011  . 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 Osteopenia 02/18/2012  . Osteoporosis   . PAF (paroxysmal atrial fibrillation) ([place]) 2012  . Peripheral neuropathy 11/06/2011  . PMDD (premenstrual dysphoric disorder)   .  Seasonal allergies   . Vaginal delivery    ONE NSVD  . 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 ANTERIOR CERVICAL DECOMP/DISCECTOMY FUSION  ~ 2003  . BACK SURGERY    . BREAST SURGERY     BREAST BIOPSY--RIGHT BENIGN  . BUNIONECTOMY Bilateral   . COSMETIC SURGERY  2016   "back of my neck; related to earlier fusion"  . CYSTOSCOPY W/ STONE MANIPULATION  "several times"  . DILATION AND CURETTAGE OF UTERUS    . FOREHEAD RECONSTRUCTION Right    "removed bone protruding out of my forehead"  . HARDWARE REMOVAL  2016   "related to neck OR"  . INCONTINENCE SURGERY    . POSTERIOR CERVICAL FUSION/FORAMINOTOMY  ~ 2008; 2015  . SHOULDER ARTHROSCOPY W/ ROTATOR CUFF REPAIR Right 2012  . SPINAL FUSION  06/2014 - 2018 X ?7   "scoliosis; my entire back"  . TUBAL LIGATION    . VAGINAL HYSTERECTOMY     TV[facility]    Current Outpatient Medications  Medication Sig Dispense Refill  . acetaminophen (TYLENOL) 500 MG tablet Take 1,000 mg by mouth every 6 (six) hours as needed for mild pain.     . Calcium Carbonate Antacid (TUMS CHEWY BITES PO) Take 1 tablet by mouth daily as needed (reflux).     . Calcium-Vitamin D-Vitamin K (VIACTIV PO) Take 1 tablet by mouth daily.     . cyclobenzaprine (FLEXERIL) 10 MG tablet Take 10 mg by mouth 3 (three) times daily as needed for muscle spasms.  5  . diltiazem (CARDIZEM CD) 120 MG 24 hr capsule Take 120 mg by mouth every evening.     . dofetilide (TIKOSYN) 250 MCG capsule Take 1 capsule (250 mcg total) by mouth 2 (two) times daily. 60 capsule 4  . gabapentin (NEURONTIN) 100 MG capsule Take 100 mg by mouth at bedtime.    . metoprolol tartrate (LOPRESSOR) 25 MG tablet Take 1 tablet (25 mg total) by mouth 2 (two) times daily. 60 tablet 11  . oxyCODONE (ROXICODONE) 5 MG/5ML solution Take 6.5 mg by mouth every 4 (four) hours as needed for moderate pain.     . polyethylene glycol (MIRALAX / GLYCOLAX) packet Take 17 g by mouth daily as needed  for mild constipation.    . potassium chloride (K-DUR) 10 MEQ tablet Take 1 tablet (10 mEq total) by mouth daily. 30 tablet 4  . rivaroxaban (XARELTO) 20 MG TABS tablet Take 20 mg by mouth daily with supper.     . Teriparatide, Recombinant, (FORTEO) 600 MCG/2.4ML SOLN Inject 20 mcg into the skin at bedtime.     . valACYclovir (VALTREX) 500 MG tablet Take 500 mg by mouth daily as needed (skin outbreak).     . meclizine (ANTIVERT) 25 MG tablet Take 1 tablet (25 mg total) by mouth 3 (three) times daily as needed for dizziness. (Patient not taking: Reported on 04/08/2018) 45 tablet 0  . Nystatin POWD Apply small amount to affected area. (Patient not taking: Reported on 04/08/2018) 1 Bottle 3   No current facility-administered medications for this encounter.     Allergies  Allergen Reactions  . Contrast Media [Iodinated Diagnostic Agents] Hives    unknown  . Cymbalta [Duloxetine Hcl]     Severe diarrhea and upset stomach  . Erythromycin     unknown  . Latex     hives  . Levofloxacin Nausea And Vomiting  . Nsaids     Other reaction(s): Other (See Comments) CANNOT TAKE PER CARDIOLOGIST DUE TO AFIB   . Other     CAN NOT TAKE cLONAZEPAM, aLPRAZOLAM, OR ELAVIL DUE TO AFIB FOR ANXIETY  IV CONTRAST /"DYE".  . Oxycodone     Other reaction(s): Delusions (intolerance)  PILLS ONLY  . Septra [Bactrim]     Hives   . Sulfa Antibiotics     rash  . Adhesive [Tape] Rash and Other (See Comments)    Heart monitor stickers must be rotated in order to prevent rash  . Morphine Anxiety    Social History   Socioeconomic History  . Marital status: Divorced    Spouse name: Not on file  . Number of children: Not on file  . Years of education: Not on file  . Highest education level: Not on file  Occupational History  . Not on file  Social Needs  . Financial resource strain: Not on file  . Food insecurity:    Worry: Not on file    Inability: Not on file  . Transportation needs:    Medical: Not on  file    Non-medical: Not on file  Tobacco Use  . Smoking status: Former Smoker    Packs/day: 0.10    Years: 14.00    Pack years: 1.40    Types: Cigarettes  . Smokeless tobacco: Never Used  . Tobacco comment: 03/31/2018 "quit ~ 1980; someday smoker when I did smoke; never addicted"  Substance and Sexual Activity  . Alcohol use: Never    Frequency: Never  . Drug use: Yes  Types: Oxycodone    Comment: 03/31/2018 "for chronic neck and back pain"  . Sexual activity: Not Currently  Lifestyle  . Physical activity:    Days per week: Not on file    Minutes per session: Not on file  . Stress: Not on file  Relationships  . Social connections:    Talks on phone: Not on file    Gets together: Not on file    Attends religious service: Not on file    Active member of club or organization: Not on file    Attends meetings of clubs or organizations: Not on file    Relationship status: Not on file  . Intimate partner violence:    Fear of current or ex partner: Not on file    Emotionally abused: Not on file    Physically abused: Not on file    Forced sexual activity: Not on file  Other Topics Concern  . Not on file  Social History Narrative  . Not on file    Family History  Problem Relation Age of Onset  . Diabetes Father   . Hyperlipidemia Sister   . Heart disease Sister   . Stroke Sister   . Diabetes Brother   . Hyperlipidemia Sister   . Heart disease Sister   . Arthritis Mother   . Cancer Mother        uterus  . Heart disease Mother   . Diabetes Brother   . Heart Problems Brother     ROS- All systems are reviewed and negative except as per the HPI above.  Physical Exam: Vitals:   04/08/18 1030  BP: 124/76  Pulse: 71  Weight: 56.2 kg  Height: 5\' 6"  (1.676 m)    GEN- The patient is elderly and thin appearing, alert and oriented x 3 today.   Head- normocephalic, atraumatic Eyes-  Sclera clear, conjunctiva pink Ears- hearing intact Oropharynx- clear Neck- supple    Lungs- Clear to ausculation bilaterally, normal work of breathing Heart- Regular rate and rhythm  GI- soft, NT, ND, + BS Extremities- no clubbing, cyanosis, or edema MS- no significant deformity or atrophy Skin- no rash or lesion Psych- euthymic mood, full affect Neuro- strength and sensation are intact  Wt Readings from Last 3 Encounters:  04/08/18 56.2 kg  04/01/18 55.5 kg  03/29/18 57.2 kg    EKG today demonstrates sinus rhythm, rate 71, QTc 422msec  Epic records are reviewed at length today  Assessment and Plan:  1. Paroxysmal atrial fibrillation Doing well on Tikosyn BMET, Mg today QTc stable Continue Xarelto for CHADS2VASC of 2 We reviewed timing of taking Tiksoyn and potential drug interactions today  Follow up with Dr Lovena Le end of October, AF clinic 07/2018   Chanetta Marshall, NP 04/08/2018 11:17 AM

## 2018-04-12 ENCOUNTER — Telehealth (HOSPITAL_COMMUNITY): Payer: Self-pay | Admitting: *Deleted

## 2018-04-12 ENCOUNTER — Ambulatory Visit (INDEPENDENT_AMBULATORY_CARE_PROVIDER_SITE_OTHER): Payer: Medicare Other | Admitting: Family Medicine

## 2018-04-12 ENCOUNTER — Encounter: Payer: Self-pay | Admitting: Family Medicine

## 2018-04-12 ENCOUNTER — Other Ambulatory Visit: Payer: Self-pay

## 2018-04-12 VITALS — BP 106/72 | HR 68 | Temp 98.1°F | Resp 16 | Ht 66.0 in | Wt 126.0 lb

## 2018-04-12 DIAGNOSIS — F329 Major depressive disorder, single episode, unspecified: Secondary | ICD-10-CM

## 2018-04-12 DIAGNOSIS — I480 Paroxysmal atrial fibrillation: Secondary | ICD-10-CM

## 2018-04-12 DIAGNOSIS — F419 Anxiety disorder, unspecified: Secondary | ICD-10-CM

## 2018-04-12 DIAGNOSIS — N390 Urinary tract infection, site not specified: Secondary | ICD-10-CM

## 2018-04-12 DIAGNOSIS — F32A Depression, unspecified: Secondary | ICD-10-CM

## 2018-04-12 LAB — POCT URINALYSIS DIPSTICK
Bilirubin, UA: NEGATIVE
Glucose, UA: NEGATIVE
Ketones, UA: NEGATIVE
Leukocytes, UA: NEGATIVE
Nitrite, UA: NEGATIVE
Protein, UA: NEGATIVE
Spec Grav, UA: >=1.03 — AB (ref 1.010–1.025)
Urobilinogen, UA: 0.2 E.U./dL
pH, UA: 5.5 (ref 5.0–8.0)

## 2018-04-12 NOTE — Telephone Encounter (Signed)
Patient approved for patient assistance for tikosyn through 07/06/18. Patient ID is [id number]

## 2018-04-12 NOTE — Progress Notes (Signed)
   Subjective:    Patient ID: [name], female    DOB: Nov 04, 1945, 72 y.o.   MRN: [id number]  HPI Hospital f/u- pt was admitted 9/23-9/26 for Tikosyn loading due to paroxysmal Afib.  She initially was hypokalemic and was this was repleted.  Labs done at [facility] on 10/3 in Afib clinic were WNL.  Her initial dose of Tikosyn was lowered due to prolonged QTc.  This was also normal at Afib clinic last week.  Pt reports she was in Afib last night, 'it wasn't as bad'.    UTI- pt had abnormal UA in hospital and was tx'd w/ Keflex.  No culture was sent.  Pt reports she has been 'housebound' for the last 4 yrs due to neck pain and complications from her surgeries.  Pt reports her Afib 'terrorizes me'.  'I have no support system whatsoever'.  Pt has tried therapy, medications in the past.  'I don't know how to get it together anymore'.  Son left in March and barely communicates w/ pt.  'i'm not depressed b/c i'm depressed, i'm depressed b/c of my life'.      Review of Systems For ROS see HPI     Objective:   Physical Exam  Constitutional: She is oriented to person, place, and time.  Frail, elderly woman  HENT:  Head: Normocephalic and atraumatic.  Neurological: She is alert and oriented to person, place, and time.  Skin: Skin is warm and dry.  Psychiatric: Her behavior is normal. Thought content normal.  Very anxious and depressed Pt able to contract for safety  Vitals reviewed.         Assessment & Plan:  UTI- repeat UA and send for cx if needed to determine if infxn present.

## 2018-04-12 NOTE — Patient Instructions (Signed)
The most important next steps is finding you the right person to help you work through this. Please call [facility] at [phone] and/or [facility] at [phone] for an appt ASAP.  Both of these are in [place] and offer both counseling and medication management Call with any questions or concerns Hang in there!

## 2018-04-12 NOTE — Assessment & Plan Note (Signed)
Pt is now on Tikosyn and following w/ [facility] rather than Dr Wynonia Lawman.  Has upcoming appt in Afib clinic.

## 2018-04-12 NOTE — Addendum Note (Signed)
Addended by: Katina Dung on: 04/12/2018 01:20 PM   Modules accepted: Orders

## 2018-04-12 NOTE — Assessment & Plan Note (Signed)
Deteriorated.  Pt spent nearly the entirety of her appt talking about her anxiety and depression.  Her son moved out in March and rarely talks w/ her, 'he had enough'.  She reports she is 'housebound' due to her chronic back and neck pain.  She no longer goes to church, refuses to let others drive her or participate in activities.  We have dealt with her severe anxiety for years- trying medications, referring to counseling and psychiatry.  She stopped her medications and did not follow up with mental health providers.  She is able to contract for safety.  We discussed senior centers, activities, ways to get involved- pt refuses all of them.  Names and numbers of local mental health providers given.  Will follow.

## 2018-04-14 LAB — URINE CULTURE
MICRO NUMBER:: 91202650
SPECIMEN QUALITY:: ADEQUATE

## 2018-04-15 ENCOUNTER — Other Ambulatory Visit: Payer: Self-pay | Admitting: General Practice

## 2018-04-15 MED ORDER — AMOXICILLIN 500 MG PO CAPS: 500.0000 mg | ORAL_CAPSULE | Freq: Two times a day (BID) | ORAL | 0 refills | Status: DC

## 2018-04-15 NOTE — Progress Notes (Signed)
Venetia Night J  Date of visit:  02/09/2018 DOB:  [birth year]    Age:  72 yrs. Medical record number:  [id number]     Account number:  [id number] Primary Care Provider: [name] ____________________________ CURRENT DIAGNOSES  1. Panic disorder [episodic paroxysmal anxiety] without agoraphobia  2. Other nonrheumatic mitral valve disorders  3. Paroxysmal atrial fibrillation  4. Long term (current) use of anticoagulants  5. Nonrheumatic mitral valve disorder, unspecified ____________________________ ALLERGIES  Cipro, Intolerance-unknown  Diltiazem, Intolerance-unknown  Duloxetine, Intolerance-unknown  Erythromycin Base, Intolerance-unknown  FOSAMAX, Intolerance-unknown  IVP Dye, Iodine Containing, Hives and/or rash  Latex, Intolerance-unknown  Levofloxacin, Severe nausea & vomiting  Metoprolol, Lethargy  Metrizamide, Rash  Penicillins, Intolerance-unknown  Sulfa (Sulfonamides), Intolerance-unknown ____________________________ MEDICATIONS  1. Cardizem CD 120 mg capsule,extended release, 1 p.o. daily  2. flecainide 50 mg tablet, Take as directed  3. Forteo 20 mcg/dose (600 mcg/2.4 mL) subcutaneous pen injector, Daily  4. metoprolol tartrate 25 mg tablet, Take one tablet twice a day  5. oxycodone 5 mg/5 mL oral solution, PRN  6. Tums 200 mg calcium (500 mg) tablet, chewable, PRN  7. Tylenol Extra Strength 500 mg tablet, PRN  8. valacyclovir 500 mg tablet, PRN  9. Viactiv 500 mg-500 unit-40 mcg chewable tablet, 1 p.o. daily  10. Vitamin D3 2,000 unit tablet, 1 p.o. daily  11. Xarelto 20 mg tablet, 1 p.o. daily ____________________________ CHIEF COMPLAINTS  Followup of Paroxysmal atrial fibrillation ____________________________ HISTORY OF PRESENT ILLNESS Patient seen early for evaluation of atrial fibrillation. She has been stressed out an anxious and has panic disorder. She also has paroxysmal atrial fibrillation. She is Astra anxiolytics for per primary care doctor who will give  them to hurt because she is on oxycodone. She called here while back asking about amitriptyline but I suggested that she not take that. She awoke last night as panicked and filter heart rhythm and eventually went to the emergency room and [facility]. I reviewed the records from there and she evidently was in atrial fibrillation and reportedly converted to sinus rhythm but discharge and she wanted to be seen here today. She is asking what can be given for pain as well as or nerves and panic disorder. We discussed this intensively. She was in sinus rhythm with PACs today. She is tired and complains of pain involving or neck and hurt back. She has no chest pain and remains quite anxious. ____________________________ PAST HISTORY  Past Medical Illnesses:  depression, GERD, lumbar disc disease, osteoporosis, nephrolithiasis, history of pneumonia, panic attacks, anxiety, cervical disc disease, history of melanoma, interstitial cystitis;  Cardiovascular Illnesses:  history of mitral valve prolapse, atrial fibrillation;  Surgical Procedures:  hysterectomy, breast biopsy, melanoma rt foot, ant cervical neck fusion, bunion surgery, bony cyst forehead, posterior cervical fusion 10/09, bladder surgery, right rotator cuff surgery August 2012, extensive spinal and neck surgery fall 2015 [facility], lumbar laminectomy March 2018 to repair broken cage, repeat surgery on back and neck Sseptember 2018;  NYHA Classification:  I;  Canadian Angina Classification:  Class 0: Asymptomatic;  Cardiology Procedures-Invasive:  no previous interventional or invasive cardiology procedures;  Cardiology Procedures-Noninvasive:  treadmill cardiolite July 2008, event monitor February 2012, echocardiogram March 2012, treadmill Myoview March 2014, echocardiogram February 2016, echocardiogram March 2019;  LVEF of 60% documented via echocardiogram on 09/14/2017,   ____________________________ CARDIO-PULMONARY TEST DATES EKG Date:  02/09/2018;   Holter/Event Monitor Date: 08/13/2010;  Nuclear Study Date:  09/09/2012;  Echocardiography Date: 09/14/2017;  Chest Xray Date: 08/07/2014;  CT Scan  Date:  06/13/2004   ____________________________ FAMILY HISTORY Brother -- Brother dead, Coronary Artery Disease, Diabetes mellitus, Coronary artery bypass grafting Brother -- Brother alive with problem Brother -- Brother alive and well Father -- Father dead, Diabetes mellitus, Deceased Mother -- Mother dead, Cancer, Hypertension, Deceased Sister -- Sister alive and well Sister -- Sister alive with problem, CVA Sister -- Sister alive and well Sister -- Sister alive and well ____________________________ SOCIAL HISTORY Alcohol Use:  does not use alcohol;  Smoking:  used to smoke but quit 1984;  Diet:  regular diet;  Lifestyle:  divorced and 1 son;  Exercise:  no regular exercise;  Residence:  lives alone;   ____________________________ REVIEW OF SYSTEMS General:  malaise and fatigue Ears, Nose, Throat, Mouth:  tinnitus Respiratory: denies dyspnea, cough, wheezing or hemoptysis. Cardiovascular:  please review HPI Genitourinary-Female: nocturia, interstitial cystitisNeurological:  headaches, peripheral neuropathy Psychiatric:  panic disorder  ____________________________ PHYSICAL EXAMINATION VITAL SIGNS  Blood Pressure:  106/60 Sitting, Left arm, regular cuff  , 100/60 Standing, Left arm and regular cuff   Pulse:  76/min. Weight:  125.00 lbs. Height:  68.00"BMI: 19  Constitutional:  pleasant thin white female, in no acute distress Skin:  warm and dry to touch, no apparent skin lesions, or masses noted. Head:  normocephalic, normal hair pattern, no masses or tenderness Neck:  supple, no masses, thyromegaly, JVD. Carotid pulses are full and equal bilaterally without bruits. Chest:  normal symmetry, clear to auscultation. Cardiac:  regular rhythm, normal S1 and S2, No S3 or S4, no murmurs, gallops or rubs detected. Peripheral Pulses:  the  femoral,dorsalis pedis, and posterior tibial pulses are full and equal bilaterally with no bruits auscultated. Extremities & Back:  spine and neck straight with extensive scar entire length, bilateral venous insufficiency changes present, No edema Neurological:  no gross motor or sensory deficits noted, affect appropriate, oriented x3. ____________________________ MOST RECENT LIPID PANEL 04/21/16  CHOL TOTL 214 mg/dl, LDL 111 NM, HDL 86.6 mg/dl, TRIGLYCER 81 mg/dl, CHOL/HDL 2 (Calc) and VLDL 16.2 ____________________________ IMPRESSIONS/PLAN  1. Paroxysmal atrial fibrillation currently in sinus rhythm with PACs on EKG today 2. Mitral valve prolapse and mitral regurgitation 3. Chronic pain syndrome 4. Panic disorder  Recommendations:  Twelve-lead EKG shows sinus rhythm with PACs. I recommended that she use flecainide 50 mg as well as an extra metoprolol if she has recurrent atrial fibrillation. She has been very anxious about her overall situation. We again talked about atrial fibrillation reasons to go to the emergency room and what to do to try to stay out of the emergency room. I told her I really could not help her with medicines for anxiety or pain and recommended followup with her primary care physician. Keep regular appointment. ____________________________ TODAYS ORDERS  1. 12 Lead EKG: Today  2. Return Visit: keep appt                       ____________________________ Cardiology Physician:  Kerry Hough MD [facility]

## 2018-04-27 ENCOUNTER — Telehealth: Payer: Self-pay

## 2018-04-27 NOTE — Telephone Encounter (Signed)
   [facility] Pre-operative Risk Assessment    Request for surgical clearance:  1. What type of surgery is being performed? Gingival Graft.. Periodontal    2. When is this surgery scheduled? TBD   3. What type of clearance is required (medical clearance vs. Pharmacy clearance to hold med vs. Both)? Pharmacy  4. Are there any medications that need to be held prior to surgery and how long?  Xarelto?   5. Practice name and name of physician performing surgery? Dr. Mikey Bussing.. [facility]   6. What is your office phone number [phone]    7.   What is your office fax number [phone]  8.   Anesthesia type (None, local, MAC, general) ?

## 2018-04-28 ENCOUNTER — Ambulatory Visit: Payer: Self-pay | Admitting: Internal Medicine

## 2018-04-28 NOTE — Telephone Encounter (Signed)
Pt takes Xarelto for afib with CHADS2VASc score of 2 (age, sex). Renal function is normal. She was chemically cardioverted with Tikosyn, first dose given on 03/29/18. Ok to hold Xarelto for 1 day if needed prior to dental work, however would ensure that procedure is next week at the earliest due to recent chemical cardioversion (she is just at the 4 week mark today assuming the first dose of Tikosyn cardioverted her, which is usually not the case).

## 2018-05-01 ENCOUNTER — Other Ambulatory Visit: Payer: Self-pay | Admitting: Endocrinology

## 2018-05-06 ENCOUNTER — Ambulatory Visit (INDEPENDENT_AMBULATORY_CARE_PROVIDER_SITE_OTHER): Payer: Medicare Other | Admitting: Internal Medicine

## 2018-05-06 ENCOUNTER — Encounter: Payer: Self-pay | Admitting: Internal Medicine

## 2018-05-06 VITALS — BP 130/88 | HR 67 | Ht 66.0 in | Wt 128.0 lb

## 2018-05-06 DIAGNOSIS — R350 Frequency of micturition: Secondary | ICD-10-CM | POA: Diagnosis not present

## 2018-05-06 DIAGNOSIS — I480 Paroxysmal atrial fibrillation: Secondary | ICD-10-CM | POA: Diagnosis not present

## 2018-05-06 DIAGNOSIS — N3001 Acute cystitis with hematuria: Secondary | ICD-10-CM | POA: Diagnosis not present

## 2018-05-06 MED ORDER — FUROSEMIDE 20 MG PO TABS: 20.0000 mg | ORAL_TABLET | Freq: Every day | ORAL | 3 refills | Status: DC

## 2018-05-06 NOTE — Patient Instructions (Addendum)
Medication Instructions:  Your physician has recommended you make the following change in your medication:   1.  Start taking lasix 20 mg---Take one tablet by mouth daily  If you need a refill on your cardiac medications before your next appointment, please call your pharmacy.   Lab work: None ordered.  If you have labs (blood work) drawn today and your tests are completely normal, you will receive your results only by: [name] MyChart Message (if you have MyChart) OR . A paper copy in the mail If you have any lab test that is abnormal or we need to change your treatment, we will call you to review the results.  Testing/Procedures: None ordered.  Follow-Up:  Your physician wants you to follow-up in: 3 months with Dr. Lovena Le.      At [facility], you and your health needs are our priority.  As part of our continuing mission to provide you with exceptional heart care, we have created designated Provider Care Teams.  These Care Teams include your primary Cardiologist (physician) and Advanced Practice Providers (APPs -  Physician Assistants and Nurse Practitioners) who all work together to provide you with the care you need, when you need it. Dennis Bast may see Cristopher Peru, MD or one of the following Advanced Practice Providers on your designated Care Team:   . Chanetta Marshall, NP . [name], PA-C  Any Other Special Instructions Will Be Listed Below (If Applicable).

## 2018-05-06 NOTE — Progress Notes (Signed)
HPI [name] returns today for ongoing evaluation and management of her atrial fib. She was hospitalized for initiation of dofetilide. She has had some episodes of palpitations. Her QT interval has been ok on 250 q12 of dofetilide. She notes that she has been more sob. She feels occaisional palpitations. She has had a headache and has chronic back pain from her scoliosis. She denies peripheral edema. Allergies  Allergen Reactions  . Contrast Media [Iodinated Diagnostic Agents] Hives    unknown  . Cymbalta [Duloxetine Hcl]     Severe diarrhea and upset stomach  . Erythromycin     unknown  . Latex     hives  . Levofloxacin Nausea And Vomiting  . Nsaids     Other reaction(s): Other (See Comments) CANNOT TAKE PER CARDIOLOGIST DUE TO AFIB   . Other     CAN NOT TAKE cLONAZEPAM, aLPRAZOLAM, OR ELAVIL DUE TO AFIB FOR ANXIETY  IV CONTRAST /"DYE".  . Oxycodone     Other reaction(s): Delusions (intolerance)  PILLS ONLY  . Septra [Bactrim]     Hives   . Sulfa Antibiotics     rash  . Adhesive [Tape] Rash and Other (See Comments)    Heart monitor stickers must be rotated in order to prevent rash  . Morphine Anxiety     Current Outpatient Medications  Medication Sig Dispense Refill  . acetaminophen (TYLENOL) 500 MG tablet Take 1,000 mg by mouth every 6 (six) hours as needed for mild pain.     [name] amoxicillin (AMOXIL) 500 MG capsule Take 1 capsule (500 mg total) by mouth 2 (two) times daily. 20 capsule 0  . Calcium Carbonate Antacid (TUMS CHEWY BITES PO) Take 1 tablet by mouth daily as needed (reflux).     . Calcium-Vitamin D-Vitamin K (VIACTIV PO) Take 1 tablet by mouth daily.     . cyclobenzaprine (FLEXERIL) 10 MG tablet Take 10 mg by mouth 3 (three) times daily as needed for muscle spasms.   5  . diltiazem (CARDIZEM CD) 120 MG 24 hr capsule Take 120 mg by mouth every evening.     . dofetilide (TIKOSYN) 250 MCG capsule Take 1 capsule (250 mcg total) by mouth 2 (two) times daily. 60  capsule 4  . gabapentin (NEURONTIN) 100 MG capsule Take 100 mg by mouth at bedtime.    . meclizine (ANTIVERT) 25 MG tablet Take 1 tablet (25 mg total) by mouth 3 (three) times daily as needed for dizziness. 45 tablet 0  . metoprolol tartrate (LOPRESSOR) 25 MG tablet Take 1 tablet (25 mg total) by mouth 2 (two) times daily. 60 tablet 11  . Nystatin POWD Apply small amount to affected area. 1 Bottle 3  . oxyCODONE (ROXICODONE) 5 MG/5ML solution Take 6.5 mg by mouth every 4 (four) hours as needed for moderate pain.     . polyethylene glycol (MIRALAX / GLYCOLAX) packet Take 17 g by mouth daily as needed for mild constipation.    . potassium chloride (K-DUR) 10 MEQ tablet Take 1 tablet (10 mEq total) by mouth daily. 30 tablet 4  . rivaroxaban (XARELTO) 20 MG TABS tablet Take 20 mg by mouth daily with supper.     . Teriparatide, Recombinant, (FORTEO) 600 MCG/2.4ML SOLN Inject 20 mcg into the skin at bedtime.     . valACYclovir (VALTREX) 500 MG tablet Take 500 mg by mouth daily as needed (skin outbreak).     . furosemide (LASIX) 20 MG tablet Take 1 tablet (  20 mg total) by mouth daily. 90 tablet 3   No current facility-administered medications for this visit.      Past Medical History:  Diagnosis Date  . Anxiety   . Arthritis    "maybe in my back" (03/31/2018)  . Benign paroxysmal positional vertigo 06/08/2013  . Fracture of multiple ribs 2015   "don't know from what; dx'd when I in hospital for 1st back OR" (03/31/2018)  . GERD (gastroesophageal reflux disease)   . Hair loss 04/12/2012  . Herpes   . History of blood transfusion    "twice; related to back OR" (03/31/2018)  . History of kidney stones   . Interstitial cystitis 11/06/2011  . 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 Osteopenia 02/18/2012  . Osteoporosis   . PAF (paroxysmal atrial fibrillation) ([place]) 2012  . Peripheral neuropathy 11/06/2011  . PMDD (premenstrual dysphoric disorder)   . Seasonal allergies   . Vaginal delivery     ONE NSVD  . 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   Past Surgical History:  Procedure Laterality Date  . ANTERIOR CERVICAL DECOMP/DISCECTOMY FUSION  ~ 2003  . BACK SURGERY    . BREAST SURGERY     BREAST BIOPSY--RIGHT BENIGN  . BUNIONECTOMY Bilateral   . COSMETIC SURGERY  2016   "back of my neck; related to earlier fusion"  . CYSTOSCOPY W/ STONE MANIPULATION  "several times"  . DILATION AND CURETTAGE OF UTERUS    . FOREHEAD RECONSTRUCTION Right    "removed bone protruding out of my forehead"  . HARDWARE REMOVAL  2016   "related to neck OR"  . INCONTINENCE SURGERY    . POSTERIOR CERVICAL FUSION/FORAMINOTOMY  ~ 2008; 2015  . SHOULDER ARTHROSCOPY W/ ROTATOR CUFF REPAIR Right 2012  . SPINAL FUSION  06/2014 - 2018 X ?7   "scoliosis; my entire back"  . TUBAL LIGATION    . VAGINAL HYSTERECTOMY     TVH     Family History  Problem Relation Age of Onset  . Diabetes Father   . Hyperlipidemia Sister   . Heart disease Sister   . Stroke Sister   . Diabetes Brother   . Hyperlipidemia Sister   . Heart disease Sister   . Arthritis Mother   . Cancer Mother        uterus  . Heart disease Mother   . Diabetes Brother   . Heart Problems Brother      Social History   Socioeconomic History  . Marital status: Divorced    Spouse name: Not on file  . Number of children: Not on file  . Years of education: Not on file  . Highest education level: Not on file  Occupational History  . Not on file  Social Needs  . Financial resource strain: Not on file  . Food insecurity:    Worry: Not on file    Inability: Not on file  . Transportation needs:    Medical: Not on file    Non-medical: Not on file  Tobacco Use  . Smoking status: Former Smoker    Packs/day: 0.10    Years: 14.00    Pack years: 1.40    Types: Cigarettes  . Smokeless tobacco: Never Used  . Tobacco comment: 03/31/2018 "quit ~ 1980; someday smoker when I did  smoke; never addicted"  Substance and Sexual Activity  . Alcohol use: Never  Frequency: Never  . Drug use: Yes    Types: Oxycodone    Comment: 03/31/2018 "for chronic neck and back pain"  . Sexual activity: Not Currently  Lifestyle  . Physical activity:    Days per week: Not on file    Minutes per session: Not on file  . Stress: Not on file  Relationships  . Social connections:    Talks on phone: Not on file    Gets together: Not on file    Attends religious service: Not on file    Active member of club or organization: Not on file    Attends meetings of clubs or organizations: Not on file    Relationship status: Not on file  . Intimate partner violence:    Fear of current or ex partner: Not on file    Emotionally abused: Not on file    Physically abused: Not on file    Forced sexual activity: Not on file  Other Topics Concern  . Not on file  Social History Narrative  . Not on file     BP 130/88   Pulse 67   Ht 5\' 6"  (1.676 m)   Wt 128 lb (58.1 kg)   SpO2 98%   BMI 20.66 kg/m   Physical Exam:  Well appearing 73 yo woman, NAD HEENT: Unremarkable Neck:  6 cm JVD, no thyromegally Lymphatics:  No adenopathy Back:  No CVA tenderness Lungs:  Clear with no wheezes HEART:  Regular rate rhythm, MR murmurs, no rubs, no clicks Abd:  soft, positive bowel sounds, no organomegally, no rebound, no guarding Ext:  2 plus pulses, no edema, no cyanosis, no clubbing Skin:  No rashes no nodules Neuro:  CN II through XII intact, motor grossly intact  EKG - nsr with pac's   Assess/Plan: 1. Persistent atrial fib - she is in NSR today with PAC's. Low voltage p waves. She will continue her dofetilide 2. Sob - this is multifactorial. However she has MR on exam.  3. Back pain - she is following up with her pain specialist.   Mikle Bosworth.D.

## 2018-05-07 ENCOUNTER — Encounter: Payer: Self-pay | Admitting: Family Medicine

## 2018-05-07 ENCOUNTER — Other Ambulatory Visit: Payer: Self-pay

## 2018-05-07 ENCOUNTER — Ambulatory Visit (INDEPENDENT_AMBULATORY_CARE_PROVIDER_SITE_OTHER): Payer: Medicare Other | Admitting: Family Medicine

## 2018-05-07 VITALS — BP 120/70 | HR 69 | Temp 97.9°F | Resp 16 | Ht 66.0 in | Wt 122.0 lb

## 2018-05-07 DIAGNOSIS — R319 Hematuria, unspecified: Secondary | ICD-10-CM | POA: Diagnosis not present

## 2018-05-07 DIAGNOSIS — R300 Dysuria: Secondary | ICD-10-CM | POA: Diagnosis not present

## 2018-05-07 DIAGNOSIS — N390 Urinary tract infection, site not specified: Secondary | ICD-10-CM

## 2018-05-07 LAB — POCT URINALYSIS DIPSTICK
Bilirubin, UA: NEGATIVE
Glucose, UA: NEGATIVE
Ketones, UA: NEGATIVE
Leukocytes, UA: NEGATIVE
Nitrite, UA: NEGATIVE
Protein, UA: NEGATIVE
Spec Grav, UA: 1.02 (ref 1.010–1.025)
Urobilinogen, UA: 0.2 E.U./dL
pH, UA: 6 (ref 5.0–8.0)

## 2018-05-07 NOTE — Progress Notes (Signed)
   Subjective:    Patient ID: [name], female    DOB: 1946-06-04, 72 y.o.   MRN: [id number]  HPI Urinary frequency- pt reports she completed 10 day course of abx (Amoxicillin).  Finished abx 2 weeks ago.  She does not feel that sxs resolved.  She is having suprapubic pressure- 'a little bit'.  Having to get out of bed more frequently to urinate.  Mild burning after urination.  No fevers.  No N/V.  Went to [facility] in [place] for same sxs yesterday and was prescribed Omnicef 300mg  BID x5 days.   Review of Systems For ROS see HPI     Objective:   Physical Exam  Constitutional: She is oriented to person, place, and time. She appears well-developed and well-nourished. No distress.  HENT:  Head: Normocephalic and atraumatic.  Abdominal: Soft. She exhibits no distension. There is tenderness (mild suprapubic but no CVA tenderness ).  Neurological: She is alert and oriented to person, place, and time.  Skin: Skin is warm and dry.  Vitals reviewed.         Assessment & Plan:  UTI- pt is concerned that her previous infxn did not resolve.  She is again having frequency and suprapubic pressure.  1+ blood on UA.  Given her multiple allergies and previous resistant UTI, will hold on abx until cx results available.

## 2018-05-07 NOTE — Patient Instructions (Signed)
Follow up as needed or as scheduled We'll call you with your culture results and determine if antibiotics are needed Start your fluid pill as recommended by Dr Lovena Le Drink plenty of fluids Call with any questions or concerns Hang in there!

## 2018-05-08 DIAGNOSIS — Z23 Encounter for immunization: Secondary | ICD-10-CM | POA: Diagnosis not present

## 2018-05-09 LAB — URINE CULTURE
MICRO NUMBER:: 91318215
Result:: NO GROWTH
SPECIMEN QUALITY:: ADEQUATE

## 2018-05-10 DIAGNOSIS — G894 Chronic pain syndrome: Secondary | ICD-10-CM | POA: Diagnosis not present

## 2018-05-10 DIAGNOSIS — M961 Postlaminectomy syndrome, not elsewhere classified: Secondary | ICD-10-CM | POA: Diagnosis not present

## 2018-05-10 DIAGNOSIS — Z79891 Long term (current) use of opiate analgesic: Secondary | ICD-10-CM | POA: Diagnosis not present

## 2018-05-10 DIAGNOSIS — M4693 Unspecified inflammatory spondylopathy, cervicothoracic region: Secondary | ICD-10-CM | POA: Diagnosis not present

## 2018-05-11 ENCOUNTER — Telehealth: Payer: Self-pay | Admitting: Internal Medicine

## 2018-05-11 NOTE — Telephone Encounter (Signed)
Patient has questions about medication she is on, she did not want to tell me. She wants to speak directly to the nurse. All she said was it has to do with the fluid pill and the potassium pill she is on.

## 2018-05-11 NOTE — Telephone Encounter (Signed)
Pt was calling to see how long needed to stay on Lasix and Potassium.Per pt feels fine and breathing is better Encouraged pt to monitor weight, B/P, and salt intake.  Pt will monitor and will call office  if has any problems .Will forward to Dr Lovena Le for review and recommendations ./cy

## 2018-05-20 DIAGNOSIS — M5412 Radiculopathy, cervical region: Secondary | ICD-10-CM | POA: Diagnosis not present

## 2018-05-20 DIAGNOSIS — G8929 Other chronic pain: Secondary | ICD-10-CM | POA: Diagnosis not present

## 2018-05-20 DIAGNOSIS — Z8781 Personal history of (healed) traumatic fracture: Secondary | ICD-10-CM | POA: Diagnosis not present

## 2018-05-20 DIAGNOSIS — M79605 Pain in left leg: Secondary | ICD-10-CM | POA: Diagnosis not present

## 2018-05-20 DIAGNOSIS — R262 Difficulty in walking, not elsewhere classified: Secondary | ICD-10-CM | POA: Diagnosis not present

## 2018-05-20 DIAGNOSIS — Z981 Arthrodesis status: Secondary | ICD-10-CM | POA: Diagnosis not present

## 2018-05-20 DIAGNOSIS — M79604 Pain in right leg: Secondary | ICD-10-CM | POA: Diagnosis not present

## 2018-05-20 DIAGNOSIS — M545 Low back pain: Secondary | ICD-10-CM | POA: Diagnosis not present

## 2018-05-20 DIAGNOSIS — M8588 Other specified disorders of bone density and structure, other site: Secondary | ICD-10-CM | POA: Diagnosis not present

## 2018-05-26 ENCOUNTER — Telehealth: Payer: Self-pay | Admitting: Internal Medicine

## 2018-05-26 NOTE — Telephone Encounter (Signed)
Left detailed message per DPR.  Advised Dr. Lovena Le would like Pt to continue lasix and potassium at this time.  Advised Pt she would likely always be on potassium as long as she is on dofetilide-explained need high potassium on this drug.  Advised she could discuss further at her f/u visit with Roderic Palau at the [facility].

## 2018-05-26 NOTE — Telephone Encounter (Signed)
Returned call to Pt.  Clarified how she should take her Xarelto (actual day and times).  Advised her dentist should advise her when she should restart after her procedure.  Discussed lasix, advised Dr. Lovena Le thinks she should continue taking.  Per Pt should had stopped for a few days but she will restart.  All Pt questions answered.  Pt thankful for return call.

## 2018-05-26 NOTE — Telephone Encounter (Signed)
New Message   Patient is calling to see when she should hold the Xarelto for a dental procedure. She states that he told her when but she wants to clarify.

## 2018-06-09 DIAGNOSIS — D2271 Melanocytic nevi of right lower limb, including hip: Secondary | ICD-10-CM | POA: Diagnosis not present

## 2018-06-09 DIAGNOSIS — L821 Other seborrheic keratosis: Secondary | ICD-10-CM | POA: Diagnosis not present

## 2018-06-09 DIAGNOSIS — D2272 Melanocytic nevi of left lower limb, including hip: Secondary | ICD-10-CM | POA: Diagnosis not present

## 2018-06-09 DIAGNOSIS — B009 Herpesviral infection, unspecified: Secondary | ICD-10-CM | POA: Diagnosis not present

## 2018-06-09 DIAGNOSIS — B351 Tinea unguium: Secondary | ICD-10-CM | POA: Diagnosis not present

## 2018-06-09 DIAGNOSIS — Z85820 Personal history of malignant melanoma of skin: Secondary | ICD-10-CM | POA: Diagnosis not present

## 2018-06-09 DIAGNOSIS — L820 Inflamed seborrheic keratosis: Secondary | ICD-10-CM | POA: Diagnosis not present

## 2018-06-09 DIAGNOSIS — L814 Other melanin hyperpigmentation: Secondary | ICD-10-CM | POA: Diagnosis not present

## 2018-06-11 ENCOUNTER — Other Ambulatory Visit (HOSPITAL_COMMUNITY): Payer: Self-pay | Admitting: *Deleted

## 2018-06-11 MED ORDER — DOFETILIDE 250 MCG PO CAPS: 250.0000 ug | ORAL_CAPSULE | Freq: Two times a day (BID) | ORAL | 3 refills | Status: DC

## 2018-06-15 ENCOUNTER — Telehealth: Payer: Self-pay | Admitting: General Practice

## 2018-06-15 ENCOUNTER — Telehealth (HOSPITAL_COMMUNITY): Payer: Self-pay | Admitting: *Deleted

## 2018-06-15 MED ORDER — BUSPIRONE HCL 5 MG PO TABS: 5.0000 mg | ORAL_TABLET | Freq: Two times a day (BID) | ORAL | 3 refills | Status: DC

## 2018-06-15 NOTE — Telephone Encounter (Signed)
Called and advised pt of PCP recommendations. Pt stated an understanding and medication filled to local pharmacy.

## 2018-06-15 NOTE — Telephone Encounter (Signed)
Buspar doesn't interact w/ Tikosyn.  We can start at 5mg  BID and increase as needed for better control.  However, I still recommend that she find a psychiatrist as this will be important going forward

## 2018-06-15 NOTE — Telephone Encounter (Signed)
Patient called in stating she is just not feeling well - having lots of issues with anxiety. Endorses feeling some palpations/ irregular heart rates with shortness of breath intermittently but the more I speak with patient seems to stem from times of extreme stress or anxiety.. Discussed with [name] pt may also try increasing metoprolol to 1 and 1/2 tab in the morning if symptoms persist. Pt would like to talk with PCP first before changing metoprolol. Her BP was 121/67 HR 78. Pt will let us know if she increases her metoprolol.  Encouraged patient to talk with PCP in regards to medication for anxiety to see if this will help with her symptoms. Reminded pt to ensure anxiety medication does not prolong QT due to tikosyn.

## 2018-06-15 NOTE — Addendum Note (Signed)
Addended by: Davis Gourd on: 06/15/2018 12:06 PM   Modules accepted: Orders

## 2018-06-15 NOTE — Telephone Encounter (Signed)
Copied from [place] [id number]. Topic: General - Other >> Jun 15, 2018  9:12 AM Oneta Rack wrote: Relation to pt: self  Call back number: [phone] Pharmacy: [facility], [place] [phone] (Phone) [phone] (Fax)  Reason for call:  Patient states PCP and her have discussed ANTIDEPRESSANTS and counseling, patient been unsuccessful finding a counselor and would like to PCP to prescribed ANTIDEPRESSANTS that doesn't conflict with dofetilide (TIKOSYN) 250 MCG capsule, please advise

## 2018-06-18 ENCOUNTER — Telehealth: Payer: Self-pay | Admitting: Internal Medicine

## 2018-06-18 NOTE — Telephone Encounter (Signed)
New message   Patient calling the office for samples of medication:   1.  What medication and dosage are you requesting samples for? rivaroxaban (XARELTO) 20 MG TABS tablet  2.  Are you currently out of this medication? No

## 2018-06-18 NOTE — Telephone Encounter (Signed)
I dont see where Dr Lovena Le refills this for the patient. Please advise. Thanks, MI

## 2018-06-21 ENCOUNTER — Telehealth: Payer: Self-pay | Admitting: Physician Assistant

## 2018-06-21 NOTE — Telephone Encounter (Signed)
[name] has been dealing with a lot of anxiety issue and inquiring if she can take her klonipin and whether this will interact with tikosyn. I did not find specific interaction between these 2 medications and I advised her to discuss with her PCP regarding the anxiety.   Hilbert Corrigan PA Pager: [phone]

## 2018-06-21 NOTE — Telephone Encounter (Signed)
Spoke with patient and she is currently taking Xarelto 20 mg daily. She does get it through patient assistance however she has to meet a deductible the beginning of the year. She is hoping to get samples for 2 weeks and will fill in January. We are currently out of Xarelto 20 mg will forward to [name] to see if [facility] office has any.

## 2018-06-21 NOTE — Telephone Encounter (Signed)
Left Xarelto samples for Pt to pick up at [address] office.  Pt indicates understanding.

## 2018-06-21 NOTE — Telephone Encounter (Signed)
Follow up   Pt requesting samples of Xarelto pt sees [facility]  Patient calling the office for samples of medication:   1.  What medication and dosage are you requesting samples for? rivaroxaban (XARELTO) 20 MG TABS tablet  2.  Are you currently out of this medication?

## 2018-06-22 ENCOUNTER — Ambulatory Visit (HOSPITAL_COMMUNITY)
Admission: RE | Admit: 2018-06-22 | Discharge: 2018-06-22 | Disposition: A | Discharge status: Home / Self Care | Payer: Medicare Other | Source: Ambulatory Visit | Attending: Nurse Practitioner | Admitting: Nurse Practitioner

## 2018-06-22 ENCOUNTER — Encounter (HOSPITAL_COMMUNITY): Payer: Self-pay | Admitting: Nurse Practitioner

## 2018-06-22 VITALS — BP 128/70 | HR 78 | Ht 66.0 in | Wt 126.0 lb

## 2018-06-22 DIAGNOSIS — Z888 Allergy status to other drugs, medicaments and biological substances status: Secondary | ICD-10-CM | POA: Insufficient documentation

## 2018-06-22 DIAGNOSIS — R0600 Dyspnea, unspecified: Secondary | ICD-10-CM | POA: Diagnosis not present

## 2018-06-22 DIAGNOSIS — K219 Gastro-esophageal reflux disease without esophagitis: Secondary | ICD-10-CM | POA: Insufficient documentation

## 2018-06-22 DIAGNOSIS — Z79899 Other long term (current) drug therapy: Secondary | ICD-10-CM | POA: Diagnosis not present

## 2018-06-22 DIAGNOSIS — Z9889 Other specified postprocedural states: Secondary | ICD-10-CM | POA: Insufficient documentation

## 2018-06-22 DIAGNOSIS — Z833 Family history of diabetes mellitus: Secondary | ICD-10-CM | POA: Diagnosis not present

## 2018-06-22 DIAGNOSIS — Z7901 Long term (current) use of anticoagulants: Secondary | ICD-10-CM | POA: Insufficient documentation

## 2018-06-22 DIAGNOSIS — Z90710 Acquired absence of both cervix and uterus: Secondary | ICD-10-CM | POA: Diagnosis not present

## 2018-06-22 DIAGNOSIS — R0602 Shortness of breath: Secondary | ICD-10-CM | POA: Diagnosis not present

## 2018-06-22 DIAGNOSIS — Z823 Family history of stroke: Secondary | ICD-10-CM | POA: Insufficient documentation

## 2018-06-22 DIAGNOSIS — Z882 Allergy status to sulfonamides status: Secondary | ICD-10-CM | POA: Diagnosis not present

## 2018-06-22 DIAGNOSIS — F418 Other specified anxiety disorders: Secondary | ICD-10-CM

## 2018-06-22 DIAGNOSIS — Z8249 Family history of ischemic heart disease and other diseases of the circulatory system: Secondary | ICD-10-CM | POA: Diagnosis not present

## 2018-06-22 DIAGNOSIS — Z87891 Personal history of nicotine dependence: Secondary | ICD-10-CM | POA: Insufficient documentation

## 2018-06-22 DIAGNOSIS — Z881 Allergy status to other antibiotic agents status: Secondary | ICD-10-CM | POA: Insufficient documentation

## 2018-06-22 DIAGNOSIS — F410 Panic disorder [episodic paroxysmal anxiety] without agoraphobia: Secondary | ICD-10-CM | POA: Insufficient documentation

## 2018-06-22 DIAGNOSIS — F419 Anxiety disorder, unspecified: Secondary | ICD-10-CM | POA: Diagnosis not present

## 2018-06-22 DIAGNOSIS — I480 Paroxysmal atrial fibrillation: Secondary | ICD-10-CM | POA: Insufficient documentation

## 2018-06-22 DIAGNOSIS — Z886 Allergy status to analgesic agent status: Secondary | ICD-10-CM | POA: Insufficient documentation

## 2018-06-22 DIAGNOSIS — Z981 Arthrodesis status: Secondary | ICD-10-CM | POA: Insufficient documentation

## 2018-06-22 DIAGNOSIS — Z9851 Tubal ligation status: Secondary | ICD-10-CM | POA: Insufficient documentation

## 2018-06-22 DIAGNOSIS — Z885 Allergy status to narcotic agent status: Secondary | ICD-10-CM | POA: Insufficient documentation

## 2018-06-22 NOTE — Progress Notes (Signed)
Primary Care Physician: [name], MD Primary Cardiologist: Wynonia Lawman Primary Electrophysiologist: [name] is a 72 y.o. female with a history of paroxysmal atrial fibrillation who presents for follow up in the [facility]," I htought I was going to die last night." Pt has h/o anxiety/panic disorder and has become increasingly  anxious, having called our office 1-2x a day all last week for random issues she was worried about. Yesterday, she felt that there was something wrong with her heart or her lungs, " I thought I was going to die"  and could not sleep for the second night in a row. She called the MD on call and she was advised to take 1/2 tab of an old clonazepam she had on hand. She was able to go to sleep  then. Was recently started on Buspar, but does not feel that it is helping. States, " I need a pill or something to get out of this hole." She discussed with her PCP re seeing a counselor but has had some ill fitting counselors in the past and is leary about seeing a new one.  Since  being started on tikosyn, the pt is staying in [place] but is now wondering if [name] is contributing to her issues. She states that she has had chronic shortness of breath for years. She describes that it was hard to bring the air up from the base of her lungs. Very shaky and emotional  today with occasional tearing up.    Today, she denies symptoms of palpitations, chest pain,   orthopnea, PND, lower extremity edema, dizziness, presyncope, syncope, snoring, daytime somnolence, bleeding, or neurologic sequela. The patient is tolerating medications without difficulties and is otherwise without complaint today.   Atrial Fibrillation Risk Factors:  she does not have symptoms or diagnosis of sleep apnea.  she does not have a history of rheumatic fever.  she does not have a history of alcohol use.  she has a BMI of Body mass index is 20.34 kg/m.[name] Filed Weights   06/22/18 1354  Weight: 57.2 kg    LA size: 37   Atrial Fibrillation Management history:  Previous antiarrhythmic drugs: Flecainide (did not tolerate); tikosyn started 03/2018  Previous cardioversions: none  Previous ablations: none  CHADS2VASC score: 2  Anticoagulation history: Xarelto   Past Medical History:  Diagnosis Date  . Anxiety   . Arthritis    "maybe in my back" (03/31/2018)  . Benign paroxysmal positional vertigo 06/08/2013  . Fracture of multiple ribs 2015   "don't know from what; dx'd when I in hospital for 1st back OR" (03/31/2018)  . GERD (gastroesophageal reflux disease)   . Hair loss 04/12/2012  . Herpes   . History of blood transfusion    "twice; related to back OR" (03/31/2018)  . History of kidney stones   . Interstitial cystitis 11/06/2011  . 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 Osteopenia 02/18/2012  . Osteoporosis   . PAF (paroxysmal atrial fibrillation) ([place]) 2012  . Peripheral neuropathy 11/06/2011  . PMDD (premenstrual dysphoric disorder)   . Seasonal allergies   . Vaginal delivery    ONE NSVD  . 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 ANTERIOR CERVICAL DECOMP/DISCECTOMY FUSION  ~ 2003  . BACK SURGERY    . BREAST SURGERY     BREAST BIOPSY--RIGHT BENIGN  . BUNIONECTOMY Bilateral   . COSMETIC SURGERY  2016   "  back of my neck; related to earlier fusion"  . CYSTOSCOPY W/ STONE MANIPULATION  "several times"  . DILATION AND CURETTAGE OF UTERUS    . FOREHEAD RECONSTRUCTION Right    "removed bone protruding out of my forehead"  . HARDWARE REMOVAL  2016   "related to neck OR"  . INCONTINENCE SURGERY    . POSTERIOR CERVICAL FUSION/FORAMINOTOMY  ~ 2008; 2015  . SHOULDER ARTHROSCOPY W/ ROTATOR CUFF REPAIR Right 2012  . SPINAL FUSION  06/2014 - 2018 X ?7   "scoliosis; my entire back"  . TUBAL LIGATION    . VAGINAL HYSTERECTOMY     TV[facility]    Current Outpatient Medications  Medication Sig Dispense Refill    . acetaminophen (TYLENOL) 500 MG tablet Take 1,000 mg by mouth every 6 (six) hours as needed for mild pain.     . busPIRone (BUSPAR) 5 MG tablet Take 1 tablet (5 mg total) by mouth 2 (two) times daily. (Patient taking differently: Take 2.5 mg by mouth 2 (two) times daily. ) 60 tablet 3  . Calcium Carbonate Antacid (TUMS CHEWY BITES PO) Take 1 tablet by mouth daily as needed (reflux).     . Calcium-Vitamin D-Vitamin K (VIACTIV PO) Take 1 tablet by mouth daily.     [name] diltiazem (CARDIZEM CD) 120 MG 24 hr capsule Take 120 mg by mouth every evening.     . dofetilide (TIKOSYN) 250 MCG capsule Take 1 capsule (250 mcg total) by mouth 2 (two) times daily. 180 capsule 3  . furosemide (LASIX) 20 MG tablet Take 1 tablet (20 mg total) by mouth daily. 90 tablet 3  . gabapentin (NEURONTIN) 100 MG capsule Take 100 mg by mouth at bedtime.    . metoprolol tartrate (LOPRESSOR) 25 MG tablet Take 1 tablet (25 mg total) by mouth 2 (two) times daily. 60 tablet 11  . oxyCODONE (ROXICODONE) 5 MG/5ML solution Take 6.5 mg by mouth every 4 (four) hours as needed for moderate pain.     . polyethylene glycol (MIRALAX / GLYCOLAX) packet Take 17 g by mouth daily as needed for mild constipation.    . potassium chloride (K-DUR) 10 MEQ tablet Take 1 tablet (10 mEq total) by mouth daily. 30 tablet 4  . rivaroxaban (XARELTO) 20 MG TABS tablet Take 20 mg by mouth daily with supper.     . Teriparatide, Recombinant, (FORTEO) 600 MCG/2.4ML SOLN Inject 20 mcg into the skin at bedtime.     . valACYclovir (VALTREX) 500 MG tablet Take 500 mg by mouth daily as needed (skin outbreak).     . cyclobenzaprine (FLEXERIL) 10 MG tablet Take 10 mg by mouth 3 (three) times daily as needed for muscle spasms.   5  . meclizine (ANTIVERT) 25 MG tablet Take 1 tablet (25 mg total) by mouth 3 (three) times daily as needed for dizziness. (Patient not taking: Reported on 06/22/2018) 45 tablet 0  . Nystatin POWD Apply small amount to affected area. (Patient not  taking: Reported on 06/22/2018) 1 Bottle 3   No current facility-administered medications for this encounter.     Allergies  Allergen Reactions  . Contrast Media [Iodinated Diagnostic Agents] Hives    unknown  . Cymbalta [Duloxetine Hcl]     Severe diarrhea and upset stomach  . Erythromycin     unknown  . Latex     hives  . Levofloxacin Nausea And Vomiting  . Nsaids     Other reaction(s): Other (See Comments) CANNOT TAKE PER CARDIOLOGIST DUE TO  AFIB   . Other     CAN NOT TAKE cLONAZEPAM, aLPRAZOLAM, OR ELAVIL DUE TO AFIB FOR ANXIETY  IV CONTRAST /"DYE".  . Oxycodone     Other reaction(s): Delusions (intolerance)  PILLS ONLY  . Septra [Bactrim]     Hives   . Sulfa Antibiotics     rash  . Adhesive [Tape] Rash and Other (See Comments)    Heart monitor stickers must be rotated in order to prevent rash  . Morphine Anxiety    Social History   Socioeconomic History  . Marital status: Divorced    Spouse name: Not on file  . Number of children: Not on file  . Years of education: Not on file  . Highest education level: Not on file  Occupational History  . Not on file  Social Needs  . Financial resource strain: Not on file  . Food insecurity:    Worry: Not on file    Inability: Not on file  . Transportation needs:    Medical: Not on file    Non-medical: Not on file  Tobacco Use  . Smoking status: Former Smoker    Packs/day: 0.10    Years: 14.00    Pack years: 1.40    Types: Cigarettes  . Smokeless tobacco: Never Used  . Tobacco comment: 03/31/2018 "quit ~ 1980; someday smoker when I did smoke; never addicted"  Substance and Sexual Activity  . Alcohol use: Never    Frequency: Never  . Drug use: Yes    Types: Oxycodone    Comment: 03/31/2018 "for chronic neck and back pain"  . Sexual activity: Not Currently  Lifestyle  . Physical activity:    Days per week: Not on file    Minutes per session: Not on file  . Stress: Not on file  Relationships  . Social  connections:    Talks on phone: Not on file    Gets together: Not on file    Attends religious service: Not on file    Active member of club or organization: Not on file    Attends meetings of clubs or organizations: Not on file    Relationship status: Not on file  . Intimate partner violence:    Fear of current or ex partner: Not on file    Emotionally abused: Not on file    Physically abused: Not on file    Forced sexual activity: Not on file  Other Topics Concern  . Not on file  Social History Narrative  . Not on file    Family History  Problem Relation Age of Onset  . Diabetes Father   . Hyperlipidemia Sister   . Heart disease Sister   . Stroke Sister   . Diabetes Brother   . Hyperlipidemia Sister   . Heart disease Sister   . Arthritis Mother   . Cancer Mother        uterus  . Heart disease Mother   . Diabetes Brother   . Heart Problems Brother     ROS- All systems are reviewed and negative except as per the HPI above.  Physical Exam: Vitals:   06/22/18 1354  BP: 128/70  Pulse: 78  Weight: 57.2 kg  Height: 5\' 6"  (1.676 m)    GEN- The patient is elderly and thin appearing, alert and oriented x 3 today.   Head- normocephalic, atraumatic Eyes-  Sclera clear, conjunctiva pink Ears- hearing intact Oropharynx- clear Neck- supple  Lungs- Clear to ausculation bilaterally, normal work  of breathing Heart- Regular rate and rhythm  GI- soft, NT, ND, + BS Extremities- no clubbing, cyanosis, or edema MS- no significant deformity or atrophy Skin- no rash or lesion Psych- euthymic mood, full affect Neuro- strength and sensation are intact  Wt Readings from Last 3 Encounters:  06/22/18 57.2 kg  05/07/18 55.3 kg  05/06/18 58.1 kg    EKG today demonstrates sinus rhythm, rate 78, QTc 453 msec  Epic records are reviewed at length today  Assessment and Plan:  1. Paroxysmal atrial fibrillation Doing well on Tikosyn, staying in SR QTc stable Continue Xarelto  for CHADS2VASC of 2  2. H/o anxiety/panic disorder This appears to be escalated lately with pt reporting that she needs some help" a pill, to get her out of this hole" She seems to be overly concerned about the health of her lungs PO is 96% on RA Will obtain a CXR as she says this would put her mind at ease to know that this is normal, will notify results when   available    I will send my note to Dr. [name] to f/u on her anxiety/ and she was encouraged to make an appointment   Roderic Palau, NP 06/22/2018 2:19 PM

## 2018-06-24 DIAGNOSIS — M542 Cervicalgia: Secondary | ICD-10-CM | POA: Diagnosis not present

## 2018-06-24 DIAGNOSIS — R202 Paresthesia of skin: Secondary | ICD-10-CM | POA: Diagnosis not present

## 2018-06-24 DIAGNOSIS — G603 Idiopathic progressive neuropathy: Secondary | ICD-10-CM | POA: Diagnosis not present

## 2018-06-24 DIAGNOSIS — R270 Ataxia, unspecified: Secondary | ICD-10-CM | POA: Diagnosis not present

## 2018-06-24 DIAGNOSIS — R201 Hypoesthesia of skin: Secondary | ICD-10-CM | POA: Diagnosis not present

## 2018-07-01 ENCOUNTER — Other Ambulatory Visit: Payer: Self-pay

## 2018-07-01 ENCOUNTER — Encounter: Payer: Self-pay | Admitting: Family Medicine

## 2018-07-01 ENCOUNTER — Ambulatory Visit (INDEPENDENT_AMBULATORY_CARE_PROVIDER_SITE_OTHER): Payer: Medicare Other | Admitting: Family Medicine

## 2018-07-01 VITALS — BP 112/68 | HR 62 | Temp 98.2°F | Resp 17 | Ht 66.0 in | Wt 124.0 lb

## 2018-07-01 DIAGNOSIS — F32A Depression, unspecified: Secondary | ICD-10-CM

## 2018-07-01 DIAGNOSIS — F329 Major depressive disorder, single episode, unspecified: Secondary | ICD-10-CM

## 2018-07-01 DIAGNOSIS — F419 Anxiety disorder, unspecified: Secondary | ICD-10-CM

## 2018-07-01 MED ORDER — CLONAZEPAM 0.5 MG PO TABS: 0.5000 mg | ORAL_TABLET | Freq: Two times a day (BID) | ORAL | 0 refills | Status: DC | PRN

## 2018-07-01 NOTE — Progress Notes (Signed)
   Subjective:    Patient ID: [name], female    DOB: 1945/11/09, 72 y.o.   MRN: [id number]  HPI Anxiety- pt was seen by Cards on 12/17 and at the time was very anxious.  She was calling Cards 1-2x/day regularly at that point w/ multiple health concerns.  She was particularly worried about her breathing- had a stable CXR which helped temporarily reassure her.  Pt was started on Buspar 12/10 for severe anxiety- 2.5mg  BID.  Pt is tolerating this w/o difficulty.  Pt is interested in increasing Buspar to 5mg  twice daily to improve her anxiety.  She is still considering seeing Psych- as has been recommended multiple times.  Pt reports she wakes up and 'wonder if i'm going to make it through the day'- feels these thoughts may be pain related, financial stress, family issues.  Pt is asking to restart Clonazepam as needed for panicked moments.   Review of Systems For ROS see HPI     Objective:   Physical Exam Vitals signs reviewed.  Constitutional:      General: She is not in acute distress.    Appearance: Normal appearance.  Skin:    General: Skin is warm and dry.     Coloration: Skin is not jaundiced or pale.  Neurological:     General: No focal deficit present.     Mental Status: She is alert and oriented to person, place, and time. Mental status is at baseline.  Psychiatric:        Behavior: Behavior normal.        Thought Content: Thought content normal.     Comments: Anxious but this is unchanged from previous           Assessment & Plan:

## 2018-07-01 NOTE — Patient Instructions (Signed)
Follow up in 1 month or as needed to recheck anxiety START the 5mg  Buspar tab twice daily to improve anxiety USE the Clonazepam as needed for those panicked moments- but this is NOT to take regularly Complete the paperwork for the Patient Assistance Program through [place]- this goes directly to them and not through [facility] (but Cardiology may have to sign or provide a prescription) Still consider seeing a psychiatrist to help w/ anxiety.  This would be very helpful! Call with any questions or concerns Happy New Year!

## 2018-07-01 NOTE — Assessment & Plan Note (Signed)
Ongoing issue for pt.  Deteriorated recently w/ preoccupation w/ her health.  She was calling cardiology 1-2x/day w/ the feeling of 'I'm not going to make it through the day'.  Since she is on Tikosyn she is not able to take SSRI.  We started Buspar over the phone to improve anxiety.  Given the extremely low dose and short duration of use, difficult to assess of this has helped.  Will increase to 1 tab BID and again strongly encouraged her to see psychiatrist and start counseling.  Will follow.

## 2018-07-15 ENCOUNTER — Telehealth: Payer: Self-pay | Admitting: *Deleted

## 2018-07-15 NOTE — Telephone Encounter (Signed)
Patient called and left message in triage asking for a call back regarding breast issues. I called and received voicemail left message.

## 2018-07-16 ENCOUNTER — Other Ambulatory Visit: Payer: Self-pay | Admitting: Cardiology

## 2018-07-16 MED ORDER — RIVAROXABAN 20 MG PO TABS: 20.0000 mg | ORAL_TABLET | Freq: Every day | ORAL | 2 refills | Status: DC

## 2018-07-16 NOTE — Telephone Encounter (Signed)
° ° °  1. Which medications need to be refilled? (please list name of each medication and dose if known) Xarelto 20mg  once daily  2. Which pharmacy/location (including street and city if local pharmacy) is medication to be sent to? [facility]   3. Do they need a 30 day or 90 day supply? [place]

## 2018-07-19 ENCOUNTER — Ambulatory Visit (INDEPENDENT_AMBULATORY_CARE_PROVIDER_SITE_OTHER): Payer: Medicare Other | Admitting: Gynecology

## 2018-07-19 ENCOUNTER — Encounter: Payer: Self-pay | Admitting: Gynecology

## 2018-07-19 VITALS — BP 118/78

## 2018-07-19 DIAGNOSIS — N644 Mastodynia: Secondary | ICD-10-CM

## 2018-07-19 NOTE — Patient Instructions (Signed)
Office will call to arrange for the MRI of the breast.

## 2018-07-19 NOTE — Progress Notes (Signed)
    [name] 05-Sep-1945 [id number]        73 y.o.  G1P1001 presents complaining of persistent right-sided breast pain.  Saw Dr Dellis Filbert 10/2017 with right-sided breast pain as well as a perceived nodularity of the breast.  Had follow-up ultrasound and diagnostic mammogram which were negative.  Had follow-up screening mammogram in August which was negative.  The breast tissue was noted to be dense, category D at that time.  The nodularity has resolved to the patient's self breast exams but she persists to have right-sided tenderness.  Past medical history,surgical history, problem list, medications, allergies, family history and social history were all reviewed and documented in the EPIC chart.  Directed ROS with pertinent positives and negatives documented in the history of present illness/assessment and plan.  Exam: Caryn Bee assistant Vitals:   07/19/18 1127  BP: 118/78   General appearance:  Normal Both breasts examined lying and sitting without masses, retractions, discharge, adenopathy.  The area of tenderness the patient's pointing to is in the inferior portion of the right breast.  Assessment/Plan:  73 y.o. G1P1001 with persistent right breast tenderness.  Negative mammogram and ultrasound.  Dense breasts on mammography category D noted.  Recommended proceeding with MRI to rule out underlying pathology.  Currently taking Tylenol for the pain and applying heat.  Assuming MRI is negative she will continue with symptomatic treatment of her discomfort.    Anastasio Auerbach MD, 11:49 AM 07/19/2018

## 2018-07-21 ENCOUNTER — Ambulatory Visit: Payer: Self-pay

## 2018-07-21 ENCOUNTER — Telehealth: Payer: Self-pay | Admitting: *Deleted

## 2018-07-21 DIAGNOSIS — B351 Tinea unguium: Secondary | ICD-10-CM | POA: Diagnosis not present

## 2018-07-21 DIAGNOSIS — N644 Mastodynia: Secondary | ICD-10-CM

## 2018-07-21 NOTE — Telephone Encounter (Signed)
Dr.Fontaine patient called and spoke with [facility] imaging to schedule MRI of breast, however she has extreme anxiety about having imaging due to health issues. patient said can't take contrast( and breast MRI are scheduled w/wo contrast) due to severe hives and made her whole body muscles tight, she has heart problems A-fib takes Tikosyn 250 mg, has trouble with knees, back, neck and was told by [facility] she will need to be in specific positions to have imaging.   Patient did not schedule imaging and asked what she should do?

## 2018-07-21 NOTE — Telephone Encounter (Signed)
Patient called regarding scheduling MRI on note 07/19/18 "persistent right breast tenderness.  Negative mammogram and ultrasound.  Dense breasts on mammography category D noted.  Recommended proceeding with MRI to rule out underlying pathology.  Currently taking Tylenol for the pain and applying heat.  Assuming MRI is negative she will continue with symptomatic treatment of her discomfort."  Order placed at [facility] patient will call to schedule.

## 2018-07-21 NOTE — Telephone Encounter (Signed)
She would need to clear the use of contrast or not using contrast with the radiologist.  I am unsure whether the what they used for MRIs is an issue.  As far as all the other issues if she feels she cannot tolerate the MRI then we really have no other choice but to follow her for now and accept the risk of a missed diagnoses.

## 2018-07-21 NOTE — Telephone Encounter (Signed)
Returned call to patient who states that she has a red face every morning.  She also has watery eyes.  She has just notice this red face which she states looks like a sunburn the past couple mornings.  She denies fever or pain. It does not itch. She states her eyes have been watery for a long time. When questioned pt states that she can not use soaps with perfume but she recently bought a new face soap that she has been using every night.  She states that it does have some scent to it.  Per protocol pt will stop using the new soap and go back to her old facial soap. Home care advice read to patient. Pt verbalized understanding of all instructions.  She will call back if symptoms do not subside in the next few days. Reason for Disposition . Mild localized rash  Answer Assessment - Initial Assessment Questions 1. APPEARANCE of RASH: "Describe the rash."      Red like a sunburn 2. LOCATION: "Where is the rash located?"      Face just in the morning 3. NUMBER: "How many spots are there?"      cheeks 4. SIZE: "How big are the spots?" (Inches, centimeters or compare to size of a coin)      Rash all over redness 5. ONSET: "When did the rash start?"      Unsure may be a new soap 6. ITCHING: "Does the rash itch?" If so, ask: "How bad is the itch?"  (Scale 1-10; or mild, moderate, severe)     no 7. PAIN: "Does the rash hurt?" If so, ask: "How bad is the pain?"  (Scale 1-10; or mild, moderate, severe)     no 8. OTHER SYMPTOMS: "Do you have any other symptoms?" (e.g., fever)     Runny nose watery eyes 9. PREGNANCY: "Is there any chance you are pregnant?" "When was your last menstrual period?"     n/a  Protocols used: RASH OR REDNESS - LOCALIZED-A-AH

## 2018-07-22 NOTE — Telephone Encounter (Signed)
Yes okay to have the diagnostic mammogram and ultrasound although she already had these within the past year for similar complaints.

## 2018-07-22 NOTE — Telephone Encounter (Signed)
Janett Billow informed with the below, she will call patient to schedule and fax order for Dr. Phineas Real to sign

## 2018-07-22 NOTE — Telephone Encounter (Signed)
Patient informed with the below note, she is going to check with PCP and cardiologist and let me know if she wants to proceed with scheduling.

## 2018-07-22 NOTE — Telephone Encounter (Signed)
Dr. Phineas Real patient called at [facility] to make Dr.Bertrand aware of MRI recommendations. The patient coordinator Janett Billow called asking if patient can have right diag.mammogram and right breast ultrasound?

## 2018-07-26 ENCOUNTER — Encounter (HOSPITAL_COMMUNITY): Payer: Self-pay | Admitting: Nurse Practitioner

## 2018-07-26 ENCOUNTER — Other Ambulatory Visit (HOSPITAL_COMMUNITY): Payer: Self-pay | Admitting: *Deleted

## 2018-07-26 ENCOUNTER — Ambulatory Visit (HOSPITAL_COMMUNITY)
Admission: RE | Admit: 2018-07-26 | Discharge: 2018-07-26 | Disposition: A | Discharge status: Home / Self Care | Payer: Medicare Other | Source: Ambulatory Visit | Attending: Nurse Practitioner | Admitting: Nurse Practitioner

## 2018-07-26 VITALS — BP 136/82 | HR 75 | Ht 66.0 in | Wt 123.0 lb

## 2018-07-26 DIAGNOSIS — Z886 Allergy status to analgesic agent status: Secondary | ICD-10-CM | POA: Diagnosis not present

## 2018-07-26 DIAGNOSIS — Z79899 Other long term (current) drug therapy: Secondary | ICD-10-CM | POA: Diagnosis not present

## 2018-07-26 DIAGNOSIS — M1990 Unspecified osteoarthritis, unspecified site: Secondary | ICD-10-CM | POA: Insufficient documentation

## 2018-07-26 DIAGNOSIS — Z888 Allergy status to other drugs, medicaments and biological substances status: Secondary | ICD-10-CM | POA: Insufficient documentation

## 2018-07-26 DIAGNOSIS — Z882 Allergy status to sulfonamides status: Secondary | ICD-10-CM | POA: Diagnosis not present

## 2018-07-26 DIAGNOSIS — Z91040 Latex allergy status: Secondary | ICD-10-CM | POA: Insufficient documentation

## 2018-07-26 DIAGNOSIS — Z881 Allergy status to other antibiotic agents status: Secondary | ICD-10-CM | POA: Insufficient documentation

## 2018-07-26 DIAGNOSIS — Z91041 Radiographic dye allergy status: Secondary | ICD-10-CM | POA: Diagnosis not present

## 2018-07-26 DIAGNOSIS — F410 Panic disorder [episodic paroxysmal anxiety] without agoraphobia: Secondary | ICD-10-CM | POA: Diagnosis not present

## 2018-07-26 DIAGNOSIS — Z885 Allergy status to narcotic agent status: Secondary | ICD-10-CM | POA: Diagnosis not present

## 2018-07-26 DIAGNOSIS — F419 Anxiety disorder, unspecified: Secondary | ICD-10-CM | POA: Insufficient documentation

## 2018-07-26 DIAGNOSIS — Z8249 Family history of ischemic heart disease and other diseases of the circulatory system: Secondary | ICD-10-CM | POA: Diagnosis not present

## 2018-07-26 DIAGNOSIS — I480 Paroxysmal atrial fibrillation: Secondary | ICD-10-CM

## 2018-07-26 DIAGNOSIS — Z7901 Long term (current) use of anticoagulants: Secondary | ICD-10-CM | POA: Diagnosis not present

## 2018-07-26 DIAGNOSIS — Z87891 Personal history of nicotine dependence: Secondary | ICD-10-CM | POA: Diagnosis not present

## 2018-07-26 DIAGNOSIS — Z833 Family history of diabetes mellitus: Secondary | ICD-10-CM | POA: Insufficient documentation

## 2018-07-26 DIAGNOSIS — G629 Polyneuropathy, unspecified: Secondary | ICD-10-CM | POA: Insufficient documentation

## 2018-07-26 LAB — MAGNESIUM: Magnesium: 1.8 mg/dL (ref 1.7–2.4)

## 2018-07-26 LAB — BASIC METABOLIC PANEL
Anion gap: 7 (ref 5–15)
BUN: 16 mg/dL (ref 8–23)
CO2: 30 mmol/L (ref 22–32)
Calcium: 9.7 mg/dL (ref 8.9–10.3)
Chloride: 106 mmol/L (ref 98–111)
Creatinine, Ser: 0.85 mg/dL (ref 0.44–1.00)
GFR calc Af Amer: >60 mL/min (ref >60)
GFR calc non Af Amer: >60 mL/min (ref >60)
Glucose, Bld: 107 mg/dL — ABNORMAL HIGH (ref 70–99)
Potassium: 3.9 mmol/L (ref 3.5–5.1)
Sodium: 143 mmol/L (ref 135–145)

## 2018-07-26 MED ORDER — MAGNESIUM 200 MG PO TABS: 200.0000 mg | ORAL_TABLET | Freq: Every day | ORAL | Status: DC

## 2018-07-26 NOTE — Progress Notes (Addendum)
Primary Care Physician: [name], MD Primary Cardiologist: Wynonia Lawman Primary Electrophysiologist: [name] is a 73 y.o. female with a history of paroxysmal atrial fibrillation who presents for follow up in the [facility]," I htought I was going to die last night." Pt has h/o anxiety/panic disorder and has become increasingly  anxious, having called our office 1-2x a day all last week for random issues she was worried about. Yesterday, she felt that there was something wrong with her heart or her lungs, " I thought I was going to die"  and could not sleep for the second night in a row. She called the MD on call and she was advised to take 1/2 tab of an old clonazepam she had on hand. She was able to go to sleep  then. Was recently started on Buspar, but does not feel that it is helping. States, " I need a pill or something to get out of this hole." She discussed with her PCP re seeing a counselor but has had some ill fitting counselors in the past and is leary about seeing a new one.  Since  being started on tikosyn, the pt is staying in [place] but is now wondering if [name] is contributing to her issues. She states that she has had chronic shortness of breath for years. She describes that it was hard to bring the air up from the base of her lungs. Very shaky and emotional  today with occasional tearing up.  F/u in afib clinic, 1/20,75 for Tikosyn surveillance. She is in a happier frame of mind today, feels better. Has not noted any afib. Taking Tikosyn consistently.     Today, she denies symptoms of palpitations, chest pain,   orthopnea, PND, lower extremity edema, dizziness, presyncope, syncope, snoring, daytime somnolence, bleeding, or neurologic sequela. The patient is tolerating medications without difficulties and is otherwise without complaint today.   Atrial Fibrillation Risk Factors:  she does not have symptoms or diagnosis of sleep apnea.  she  does not have a history of rheumatic fever.  she does not have a history of alcohol use.  she has a BMI of Body mass index is 19.85 kg/m.[name] Filed Weights   07/26/18 1049  Weight: 55.8 kg    LA size: 37   Atrial Fibrillation Management history:  Previous antiarrhythmic drugs: Flecainide (did not tolerate); tikosyn started 03/2018  Previous cardioversions: none  Previous ablations: none  CHADS2VASC score: 2  Anticoagulation history: Xarelto   Past Medical History:  Diagnosis Date  . Anxiety   . Arthritis    "maybe in my back" (03/31/2018)  . Benign paroxysmal positional vertigo 06/08/2013  . Fracture of multiple ribs 2015   "don't know from what; dx'd when I in hospital for 1st back OR" (03/31/2018)  . GERD (gastroesophageal reflux disease)   . Hair loss 04/12/2012  . Herpes   . History of blood transfusion    "twice; related to back OR" (03/31/2018)  . History of kidney stones   . Interstitial cystitis 11/06/2011  . 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 Osteopenia 02/18/2012  . Osteoporosis   . PAF (paroxysmal atrial fibrillation) ([place]) 2012  . Peripheral neuropathy 11/06/2011  . PMDD (premenstrual dysphoric disorder)   . Seasonal allergies   . Vaginal delivery    ONE NSVD  . 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 ANTERIOR CERVICAL DECOMP/DISCECTOMY FUSION  ~  2003  . BACK SURGERY    . BREAST SURGERY     BREAST BIOPSY--RIGHT BENIGN  . BUNIONECTOMY Bilateral   . COSMETIC SURGERY  2016   "back of my neck; related to earlier fusion"  . CYSTOSCOPY W/ STONE MANIPULATION  "several times"  . DILATION AND CURETTAGE OF UTERUS    . FOREHEAD RECONSTRUCTION Right    "removed bone protruding out of my forehead"  . HARDWARE REMOVAL  2016   "related to neck OR"  . INCONTINENCE SURGERY    . POSTERIOR CERVICAL FUSION/FORAMINOTOMY  ~ 2008; 2015  . SHOULDER ARTHROSCOPY W/ ROTATOR CUFF REPAIR Right 2012  . SPINAL FUSION  06/2014 - 2018  X ?7   "scoliosis; my entire back"  . TUBAL LIGATION    . VAGINAL HYSTERECTOMY     TVH    Current Outpatient Medications  Medication Sig Dispense Refill  . acetaminophen (TYLENOL) 500 MG tablet Take 1,000 mg by mouth every 6 (six) hours as needed for mild pain.     . busPIRone (BUSPAR) 5 MG tablet Take 1 tablet (5 mg total) by mouth 2 (two) times daily. (Patient taking differently: Take 2.5 mg by mouth 2 (two) times daily. ) 60 tablet 3  . Calcium Carbonate Antacid (TUMS CHEWY BITES PO) Take 1 tablet by mouth daily as needed (reflux).     . Calcium-Vitamin D-Vitamin K (VIACTIV PO) Take 1 tablet by mouth daily.     . cyclobenzaprine (FLEXERIL) 10 MG tablet Take 10 mg by mouth 3 (three) times daily as needed for muscle spasms.   5  . diltiazem (CARDIZEM CD) 120 MG 24 hr capsule Take 120 mg by mouth every evening.     . dofetilide (TIKOSYN) 250 MCG capsule Take 1 capsule (250 mcg total) by mouth 2 (two) times daily. 180 capsule 3  . furosemide (LASIX) 20 MG tablet Take 1 tablet (20 mg total) by mouth daily. 90 tablet 3  . gabapentin (NEURONTIN) 100 MG capsule Take 100 mg by mouth at bedtime.    . meclizine (ANTIVERT) 25 MG tablet Take 1 tablet (25 mg total) by mouth 3 (three) times daily as needed for dizziness. 45 tablet 0  . metoprolol tartrate (LOPRESSOR) 25 MG tablet Take 1 tablet (25 mg total) by mouth 2 (two) times daily. 60 tablet 11  . Nystatin POWD Apply small amount to affected area. 1 Bottle 3  . oxyCODONE (ROXICODONE) 5 MG/5ML solution Take 6.5 mg by mouth every 4 (four) hours as needed for moderate pain.     . polyethylene glycol (MIRALAX / GLYCOLAX) packet Take 17 g by mouth daily as needed for mild constipation.    . potassium chloride (K-DUR) 10 MEQ tablet Take 1 tablet (10 mEq total) by mouth daily. 30 tablet 4  . rivaroxaban (XARELTO) 20 MG TABS tablet Take 1 tablet (20 mg total) by mouth daily with supper. 90 tablet 2  . Teriparatide, Recombinant, (FORTEO) 600 MCG/2.4ML SOLN  Inject 20 mcg into the skin at bedtime.     . clonazePAM (KLONOPIN) 0.5 MG tablet Take 1 tablet (0.5 mg total) by mouth 2 (two) times daily as needed for anxiety. (Patient not taking: Reported on 07/26/2018) 30 tablet 0  . valACYclovir (VALTREX) 1000 MG tablet TK 2 TS PO TONIGHT AND 2 TS TOMORROW MORNING UTD  0   No current facility-administered medications for this encounter.     Allergies  Allergen Reactions  . Contrast Media [Iodinated Diagnostic Agents] Hives    unknown  .  Cymbalta [Duloxetine Hcl]     Severe diarrhea and upset stomach  . Erythromycin     unknown  . Latex     hives  . Levofloxacin Nausea And Vomiting  . Nsaids     Other reaction(s): Other (See Comments) CANNOT TAKE PER CARDIOLOGIST DUE TO AFIB   . Other     CAN NOT TAKE , aLPRAZOLAM, OR ELAVIL DUE TO AFIB FOR ANXIETY  IV CONTRAST /"DYE".  . Oxycodone     Other reaction(s): Delusions (intolerance)  PILLS ONLY  . Septra [Bactrim]     Hives   . Sulfa Antibiotics     rash  . Adhesive [Tape] Rash and Other (See Comments)    Heart monitor stickers must be rotated in order to prevent rash  . Morphine Anxiety    Social History   Socioeconomic History  . Marital status: Divorced    Spouse name: Not on file  . Number of children: Not on file  . Years of education: Not on file  . Highest education level: Not on file  Occupational History  . Not on file  Social Needs  . Financial resource strain: Not on file  . Food insecurity:    Worry: Not on file    Inability: Not on file  . Transportation needs:    Medical: Not on file    Non-medical: Not on file  Tobacco Use  . Smoking status: Former Smoker    Packs/day: 0.10    Years: 14.00    Pack years: 1.40    Types: Cigarettes  . Smokeless tobacco: Never Used  . Tobacco comment: 03/31/2018 "quit ~ 1980; someday smoker when I did smoke; never addicted"  Substance and Sexual Activity  . Alcohol use: Never    Frequency: Never  . Drug use: Yes    Types:  Oxycodone    Comment: 03/31/2018 "for chronic neck and back pain"  . Sexual activity: Not Currently  Lifestyle  . Physical activity:    Days per week: Not on file    Minutes per session: Not on file  . Stress: Not on file  Relationships  . Social connections:    Talks on phone: Not on file    Gets together: Not on file    Attends religious service: Not on file    Active member of club or organization: Not on file    Attends meetings of clubs or organizations: Not on file    Relationship status: Not on file  . Intimate partner violence:    Fear of current or ex partner: Not on file    Emotionally abused: Not on file    Physically abused: Not on file    Forced sexual activity: Not on file  Other Topics Concern  . Not on file  Social History Narrative  . Not on file    Family History  Problem Relation Age of Onset  . Diabetes Father   . Hyperlipidemia Sister   . Heart disease Sister   . Stroke Sister   . Diabetes Brother   . Hyperlipidemia Sister   . Heart disease Sister   . Arthritis Mother   . Cancer Mother        uterus  . Heart disease Mother   . Diabetes Brother   . Heart Problems Brother     ROS- All systems are reviewed and negative except as per the HPI above.  Physical Exam: Vitals:   07/26/18 1049  BP: 136/82  Pulse: 75  SpO2: 97%  Weight: 55.8 kg  Height: 5\' 6"  (1.676 m)    GEN- The patient is elderly and thin appearing, alert and oriented x 3 today.   Head- normocephalic, atraumatic Eyes-  Sclera clear, conjunctiva pink Ears- hearing intact Oropharynx- clear Neck- supple  Lungs- Clear to ausculation bilaterally, normal work of breathing Heart- Regular rate and rhythm  GI- soft, NT, ND, + BS Extremities- no clubbing, cyanosis, or edema MS- no significant deformity or atrophy Skin- no rash or lesion Psych- euthymic mood, full affect Neuro- strength and sensation are intact  Wt Readings from Last 3 Encounters:  07/26/18 55.8 kg  07/01/18  56.2 kg  06/22/18 57.2 kg    EKG today demonstrates sinus rhythm, rate 75, QTc 448 msec  Epic records are reviewed at length today  Assessment and Plan:  1. Paroxysmal atrial fibrillation Doing well on Tikosyn, staying in SR QTc stable Continue Xarelto for CHADS2VASC of 2 Bmet/mag today  2. H/o anxiety/panic disorder Appears to in a better frame of mind today,not as anxious, sees PCP again 1/31.  F/u with Dr. Lovena Le as scheduled 2/4 afib clinic in 4 months for continued Tikosyn surveillance   [place]. Zykiria Bruening, [facility] [address] [place], [place] [address] [phone]

## 2018-07-29 DIAGNOSIS — M4693 Unspecified inflammatory spondylopathy, cervicothoracic region: Secondary | ICD-10-CM | POA: Diagnosis not present

## 2018-07-29 DIAGNOSIS — G894 Chronic pain syndrome: Secondary | ICD-10-CM | POA: Diagnosis not present

## 2018-07-29 DIAGNOSIS — M961 Postlaminectomy syndrome, not elsewhere classified: Secondary | ICD-10-CM | POA: Diagnosis not present

## 2018-07-29 DIAGNOSIS — Z79891 Long term (current) use of opiate analgesic: Secondary | ICD-10-CM | POA: Diagnosis not present

## 2018-08-02 ENCOUNTER — Telehealth (HOSPITAL_COMMUNITY): Payer: Self-pay | Admitting: *Deleted

## 2018-08-02 NOTE — Telephone Encounter (Signed)
Pt.notified

## 2018-08-02 NOTE — Telephone Encounter (Signed)
-----   Message from Leeroy Bock, [facility] sent at 08/02/2018 12:44 PM EST ----- Regarding: RE: drug drug interaction check Hi Estill Bamberg,  The topical formulation of this is ok to use and will not interfere with her other medications.  Thanks, Jinny Blossom ----- Message ----- From: Iona Hansen, CMA Sent: 08/02/2018  12:23 PM EST To: Erskine Emery, RPH, Leeroy Bock, [facility] Subject: drug drug interaction check                    Pt would like to know if it is safe for her to use Rx Jublia for toenail  fungus.  It is a paint on medication that goes on the nail and under.  Pt will need to use this for 7 months.  She is on tikosyn and the oral anti fungal is contraindicated.   Thank you  Estill Bamberg

## 2018-08-03 ENCOUNTER — Ambulatory Visit: Payer: Self-pay | Admitting: Family Medicine

## 2018-08-04 ENCOUNTER — Telehealth (HOSPITAL_COMMUNITY): Payer: Self-pay | Admitting: *Deleted

## 2018-08-04 NOTE — Telephone Encounter (Signed)
Pt notified of pharmacy recommendations.

## 2018-08-04 NOTE — Telephone Encounter (Signed)
-----   Message from Leeroy Bock, [facility] sent at 08/04/2018  1:02 PM EST ----- Regarding: RE: drug-drug interaction Hi Estill Bamberg,  She can use Ciclopirox with her cardiac medications including Tikosyn and Xarelto. Topical medications tend to have low rates of systemic absorption.  Thanks, Jinny Blossom ----- Message ----- From: Iona Hansen, CMA Sent: 08/04/2018  12:36 PM EST To: Leeroy Bock, RPH Subject: drug-drug interaction                          Frazier Butt, this patient had been cleared to use Jublia with her Tikosyn, but her insurance denied the med and now want to know if she can use Ciclopirox instead.  She of course is concerned about if it interacts with her tikosyn or blood thinner.  Could you please advise?    Thank you  Estill Bamberg

## 2018-08-06 ENCOUNTER — Ambulatory Visit (INDEPENDENT_AMBULATORY_CARE_PROVIDER_SITE_OTHER): Payer: Medicare Other | Admitting: Family Medicine

## 2018-08-06 ENCOUNTER — Other Ambulatory Visit: Payer: Self-pay

## 2018-08-06 ENCOUNTER — Encounter: Payer: Self-pay | Admitting: Family Medicine

## 2018-08-06 VITALS — BP 116/70 | HR 73 | Temp 97.7°F | Resp 14 | Ht 66.0 in | Wt 123.0 lb

## 2018-08-06 DIAGNOSIS — F419 Anxiety disorder, unspecified: Secondary | ICD-10-CM

## 2018-08-06 DIAGNOSIS — F329 Major depressive disorder, single episode, unspecified: Secondary | ICD-10-CM | POA: Diagnosis not present

## 2018-08-06 DIAGNOSIS — F32A Depression, unspecified: Secondary | ICD-10-CM

## 2018-08-06 MED ORDER — BUSPIRONE HCL 5 MG PO TABS: 2.5000 mg | ORAL_TABLET | Freq: Three times a day (TID) | ORAL | 3 refills | Status: DC

## 2018-08-06 NOTE — Assessment & Plan Note (Addendum)
Pt's current anxiety is not about her health but her son who has moved away.  She and son were previously living together and she was very dependent on him.  He married very quickly and moved away.  Pt has not had any contact with him- he will not answer the house phone, no longer has a cell phone, has not written.  Pt feels 'he is upset with himself for what he has done' and that he will be returning.  I asked how she knew this or why she thought this- 'mother's intuition'.  This seems delusionally optimistic as they have not had any contact in 10 months.  But she remains steadfast in her belief that he is unhappy in his current situation and he will be returning home.  Again stressed that pt needs both counseling and psychiatric care- #s provided.  Since she is not able to tolerate 5mg  of Buspar will increase dosing frequency to TID.  Pt expressed understanding and is in agreement w/ plan. Total time spent w/ pt 30 minutes, >50% spent counseling.

## 2018-08-06 NOTE — Patient Instructions (Signed)
Follow up in 3 months to recheck cholesterol, mood TAKE the Buspar 1/2 tab 3x/day to help w/ the anxiety Please call [place] at [phone] and/or [place] at [phone] and schedule an appt for counseling/psychiatry Call with any questions or concerns Happy [birth year] Birthday! Hang in there!!

## 2018-08-06 NOTE — Progress Notes (Signed)
   Subjective:    Patient ID: [name], female    DOB: 1946-04-08, 73 y.o.   MRN: [id number]  HPI Anxiety- ongoing issue for pt.  At last visit she was to increase Buspar to 5mg  BID to improve her anxiety.  She reports she was unable to tolerate a whole pill b/c 'of the physical effects'.  She is still taking 1/2 tab BID.  She was also told to establish w/ psychiatry and a counselor.  Pt is now interested in counseling but has not pursued it.  '75% of my anxiety is about my son'- he left and has not been in contact since moving'.   Review of Systems For ROS see HPI     Objective:   Physical Exam Vitals signs reviewed.  Constitutional:      General: She is not in acute distress.    Appearance: She is not ill-appearing.     Comments: Thin, frail appearing  HENT:     Head: Normocephalic and atraumatic.  Skin:    General: Skin is warm and dry.  Neurological:     General: No focal deficit present.     Mental Status: She is alert and oriented to person, place, and time.  Psychiatric:        Behavior: Behavior normal.     Comments: Very anxious, tangential thought process           Assessment & Plan:

## 2018-08-10 ENCOUNTER — Ambulatory Visit (INDEPENDENT_AMBULATORY_CARE_PROVIDER_SITE_OTHER): Payer: Medicare Other | Admitting: Internal Medicine

## 2018-08-10 ENCOUNTER — Encounter: Payer: Self-pay | Admitting: Internal Medicine

## 2018-08-10 VITALS — BP 122/62 | HR 76 | Ht 66.0 in | Wt 123.0 lb

## 2018-08-10 DIAGNOSIS — I480 Paroxysmal atrial fibrillation: Secondary | ICD-10-CM | POA: Diagnosis not present

## 2018-08-10 DIAGNOSIS — R0600 Dyspnea, unspecified: Secondary | ICD-10-CM

## 2018-08-10 NOTE — Progress Notes (Signed)
HPI [name] returns today for ongoing evaluation and management of her atrial fib. She was hospitalized for initiation of dofetilide. She has had some episodes of palpitations. Her QT interval has been ok on 250 q12 of dofetilide. When I saw her last, she c/o dyspnea with exertion. We prescribed some lasix. She has improved in the interim and she has minimal palpitations and no recurrent symptomatic atrial fib on dofetilide.  Allergies  Allergen Reactions  . Contrast Media [Iodinated Diagnostic Agents] Hives    unknown  . Cymbalta [Duloxetine Hcl]     Severe diarrhea and upset stomach  . Erythromycin     unknown  . Latex     hives  . Levofloxacin Nausea And Vomiting  . Nsaids     Other reaction(s): Other (See Comments) CANNOT TAKE PER CARDIOLOGIST DUE TO AFIB   . Other     CAN NOT TAKE , aLPRAZOLAM, OR ELAVIL DUE TO AFIB FOR ANXIETY  IV CONTRAST /"DYE".  . Oxycodone     Other reaction(s): Delusions (intolerance)  PILLS ONLY  . Septra [Bactrim]     Hives   . Sulfa Antibiotics     rash  . Adhesive [Tape] Rash and Other (See Comments)    Heart monitor stickers must be rotated in order to prevent rash  . Morphine Anxiety     Current Outpatient Medications  Medication Sig Dispense Refill  . acetaminophen (TYLENOL) 500 MG tablet Take 1,000 mg by mouth every 6 (six) hours as needed for mild pain.     . busPIRone (BUSPAR) 5 MG tablet Take 0.5 tablets (2.5 mg total) by mouth 3 (three) times daily. 60 tablet 3  . Calcium Carbonate Antacid (TUMS CHEWY BITES PO) Take 1 tablet by mouth daily as needed (reflux).     . Calcium Citrate-Vitamin D (CALCIUM + D PO) Take 1 tablet by mouth daily.    . clonazePAM (KLONOPIN) 0.5 MG tablet Take 1 tablet (0.5 mg total) by mouth 2 (two) times daily as needed for anxiety. 30 tablet 0  . cyclobenzaprine (FLEXERIL) 10 MG tablet Take 10 mg by mouth 3 (three) times daily as needed for muscle spasms.   5  . diltiazem (CARDIZEM CD) 120 MG 24 hr  capsule Take 120 mg by mouth every evening.     . dofetilide (TIKOSYN) 250 MCG capsule Take 1 capsule (250 mcg total) by mouth 2 (two) times daily. 180 capsule 3  . furosemide (LASIX) 20 MG tablet Take 1 tablet (20 mg total) by mouth daily. 90 tablet 3  . gabapentin (NEURONTIN) 100 MG capsule Take 100 mg by mouth at bedtime.    . Magnesium 200 MG TABS Take 1 tablet (200 mg total) by mouth daily. 30 each   . meclizine (ANTIVERT) 25 MG tablet Take 1 tablet (25 mg total) by mouth 3 (three) times daily as needed for dizziness. 45 tablet 0  . metoprolol tartrate (LOPRESSOR) 25 MG tablet Take 1 tablet (25 mg total) by mouth 2 (two) times daily. 60 tablet 11  . Nystatin POWD Apply small amount to affected area. 1 Bottle 3  . oxyCODONE (ROXICODONE) 5 MG/5ML solution Take 6.5 mg by mouth every 4 (four) hours as needed for moderate pain.     . polyethylene glycol (MIRALAX / GLYCOLAX) packet Take 17 g by mouth daily as needed for mild constipation.    . potassium chloride (K-DUR) 10 MEQ tablet Take 1 tablet (10 mEq total) by mouth daily. 30 tablet  4  . rivaroxaban (XARELTO) 20 MG TABS tablet Take 1 tablet (20 mg total) by mouth daily with supper. 90 tablet 2  . Teriparatide, Recombinant, (FORTEO) 600 MCG/2.4ML SOLN Inject 20 mcg into the skin at bedtime.     . valACYclovir (VALTREX) 1000 MG tablet TK 2 TS PO TONIGHT AND 2 TS TOMORROW MORNING UTD  0   No current facility-administered medications for this visit.      Past Medical History:  Diagnosis Date  . Anxiety   . Arthritis    "maybe in my back" (03/31/2018)  . Benign paroxysmal positional vertigo 06/08/2013  . Fracture of multiple ribs 2015   "don't know from what; dx'd when I in hospital for 1st back OR" (03/31/2018)  . GERD (gastroesophageal reflux disease)   . Hair loss 04/12/2012  . Herpes   . History of blood transfusion    "twice; related to back OR" (03/31/2018)  . History of kidney stones   . Interstitial cystitis 11/06/2011  . Melanoma  of ankle ([place]) ~ 2003   "right"  . Osteopenia 02/18/2012  . Osteoporosis   . PAF (paroxysmal atrial fibrillation) ([place]) 2012  . Peripheral neuropathy 11/06/2011  . PMDD (premenstrual dysphoric disorder)   . Seasonal allergies   . Vaginal delivery    ONE NSVD  . 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   Past Surgical History:  Procedure Laterality Date  . ANTERIOR CERVICAL DECOMP/DISCECTOMY FUSION  ~ 2003  . BACK SURGERY    . BREAST SURGERY     BREAST BIOPSY--RIGHT BENIGN  . BUNIONECTOMY Bilateral   . COSMETIC SURGERY  2016   "back of my neck; related to earlier fusion"  . CYSTOSCOPY W/ STONE MANIPULATION  "several times"  . DILATION AND CURETTAGE OF UTERUS    . FOREHEAD RECONSTRUCTION Right    "removed bone protruding out of my forehead"  . HARDWARE REMOVAL  2016   "related to neck OR"  . INCONTINENCE SURGERY    . POSTERIOR CERVICAL FUSION/FORAMINOTOMY  ~ 2008; 2015  . SHOULDER ARTHROSCOPY W/ ROTATOR CUFF REPAIR Right 2012  . SPINAL FUSION  06/2014 - 2018 X ?7   "scoliosis; my entire back"  . TUBAL LIGATION    . VAGINAL HYSTERECTOMY     TVH     Family History  Problem Relation Age of Onset  . Diabetes Father   . Hyperlipidemia Sister   . Heart disease Sister   . Stroke Sister   . Diabetes Brother   . Hyperlipidemia Sister   . Heart disease Sister   . Arthritis Mother   . Cancer Mother        uterus  . Heart disease Mother   . Diabetes Brother   . Heart Problems Brother      Social History   Socioeconomic History  . Marital status: Divorced    Spouse name: Not on file  . Number of children: Not on file  . Years of education: Not on file  . Highest education level: Not on file  Occupational History  . Not on file  Social Needs  . Financial resource strain: Not on file  . Food insecurity:    Worry: Not on file    Inability: Not on file  . Transportation needs:    Medical: Not on file     Non-medical: Not on file  Tobacco Use  . Smoking status: Former Smoker  Packs/day: 0.10    Years: 14.00    Pack years: 1.40    Types: Cigarettes  . Smokeless tobacco: Never Used  . Tobacco comment: 03/31/2018 "quit ~ 1980; someday smoker when I did smoke; never addicted"  Substance and Sexual Activity  . Alcohol use: Never    Frequency: Never  . Drug use: Yes    Types: Oxycodone    Comment: 03/31/2018 "for chronic neck and back pain"  . Sexual activity: Not Currently  Lifestyle  . Physical activity:    Days per week: Not on file    Minutes per session: Not on file  . Stress: Not on file  Relationships  . Social connections:    Talks on phone: Not on file    Gets together: Not on file    Attends religious service: Not on file    Active member of club or organization: Not on file    Attends meetings of clubs or organizations: Not on file    Relationship status: Not on file  . Intimate partner violence:    Fear of current or ex partner: Not on file    Emotionally abused: Not on file    Physically abused: Not on file    Forced sexual activity: Not on file  Other Topics Concern  . Not on file  Social History Narrative  . Not on file     BP 122/62   Pulse 76   Ht 5\' 6"  (1.676 m)   Wt 123 lb (55.8 kg)   SpO2 96%   BMI 19.85 kg/m   Physical Exam:  Well appearing NAD HEENT: Unremarkable Neck:  No JVD, no thyromegally Lymphatics:  No adenopathy Back:  No CVA tenderness Lungs:  Clear with no wheezes HEART:  Regular rate rhythm, no murmurs, no rubs, no clicks Abd:  soft, positive bowel sounds, no organomegally, no rebound, no guarding Ext:  2 plus pulses, no edema, no cyanosis, no clubbing Skin:  No rashes no nodules Neuro:  CN II through XII intact, motor grossly intact  DEVICE  Normal device function.  See PaceArt for details.   Assess/Plan: 1. PAF - she is maintaining NSR nicely. She will continue dofetilide. 2. Sinus node dysfunction - she is asymptomatic  at this point.  3. 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 I asked her to continue the lasix and reduce her salt intake.  Cristopher Peru

## 2018-08-10 NOTE — Patient Instructions (Addendum)
Medication Instructions:  Your physician recommends that you continue on your current medications as directed. Please refer to the Current Medication list given to you today.  Labwork: None ordered.  Testing/Procedures: None ordered.  Follow-Up: Your physician wants you to follow-up in: 6 months with [name] in [facility].   You will receive a reminder letter in the mail two months in advance. If you don't receive a letter, please call our office to schedule the follow-up appointment.   Any Other Special Instructions Will Be Listed Below (If Applicable).  If you need a refill on your cardiac medications before your next appointment, please call your pharmacy.

## 2018-08-12 ENCOUNTER — Encounter: Payer: Self-pay | Admitting: Gynecology

## 2018-08-12 DIAGNOSIS — N644 Mastodynia: Secondary | ICD-10-CM | POA: Diagnosis not present

## 2018-08-12 LAB — HM MAMMOGRAPHY

## 2018-08-17 ENCOUNTER — Encounter: Payer: Self-pay | Admitting: General Practice

## 2018-08-25 ENCOUNTER — Ambulatory Visit (INDEPENDENT_AMBULATORY_CARE_PROVIDER_SITE_OTHER): Payer: Medicare Other | Admitting: Physician Assistant

## 2018-08-25 ENCOUNTER — Encounter: Payer: Self-pay | Admitting: Physician Assistant

## 2018-08-25 ENCOUNTER — Other Ambulatory Visit: Payer: Self-pay

## 2018-08-25 ENCOUNTER — Ambulatory Visit: Payer: Self-pay | Admitting: *Deleted

## 2018-08-25 VITALS — BP 112/70 | HR 79 | Temp 97.5°F | Resp 14 | Ht 66.0 in | Wt 122.0 lb

## 2018-08-25 DIAGNOSIS — I480 Paroxysmal atrial fibrillation: Secondary | ICD-10-CM

## 2018-08-25 DIAGNOSIS — R232 Flushing: Secondary | ICD-10-CM

## 2018-08-25 LAB — CBC WITH DIFFERENTIAL/PLATELET
Basophils Absolute: 0 10*3/uL (ref 0.0–0.1)
Basophils Relative: 0.4 % (ref 0.0–3.0)
Eosinophils Absolute: 0.2 10*3/uL (ref 0.0–0.7)
Eosinophils Relative: 2.4 % (ref 0.0–5.0)
HCT: 39.1 % (ref 36.0–46.0)
Hemoglobin: 13.2 g/dL (ref 12.0–15.0)
Lymphocytes Relative: 20.6 % (ref 12.0–46.0)
Lymphs Abs: 1.4 10*3/uL (ref 0.7–4.0)
MCHC: 33.7 g/dL (ref 30.0–36.0)
MCV: 92.5 fl (ref 78.0–100.0)
Monocytes Absolute: 0.6 10*3/uL (ref 0.1–1.0)
Monocytes Relative: 8.2 % (ref 3.0–12.0)
Neutro Abs: 4.7 10*3/uL (ref 1.4–7.7)
Neutrophils Relative %: 68.4 % (ref 43.0–77.0)
Platelets: 207 10*3/uL (ref 150.0–400.0)
RBC: 4.22 Mil/uL (ref 3.87–5.11)
RDW: 14.4 % (ref 11.5–15.5)
WBC: 6.9 10*3/uL (ref 4.0–10.5)

## 2018-08-25 LAB — BASIC METABOLIC PANEL
BUN: 16 mg/dL (ref 6–23)
CO2: 27 mEq/L (ref 19–32)
Calcium: 9.5 mg/dL (ref 8.4–10.5)
Chloride: 104 mEq/L (ref 96–112)
Creatinine, Ser: 0.59 mg/dL (ref 0.40–1.20)
GFR: 99.62 mL/min (ref >60.00)
Glucose, Bld: 75 mg/dL (ref 70–99)
Potassium: 4 mEq/L (ref 3.5–5.1)
Sodium: 142 mEq/L (ref 135–145)

## 2018-08-25 LAB — TSH: TSH: 2.49 u[IU]/mL (ref 0.35–4.50)

## 2018-08-25 NOTE — Progress Notes (Signed)
Patient presents to clinic today c/o 2 recent issues that are concerning her.   (1) Patient endorses noting redness of face at night before bedtime and sometimes early in the morning. Denies itching or pain, just noting redness. Notes that she notes this after she has washed her makeup off. Endorses using the same makeup for several years. Denies change in any soaps, lotions or detergents. Uses hot water to wash her face. Denies any symptoms presently.   (2) Patient endorses a couple of episodes over the past week where she wakes up in the middle of the night feeling flushed and with racing heart. Patient denies chest pain, lightheadedness, dizziness, vision changes or frequent headaches. Patient has not checked her BP or pulse during these episodes. Notes they last for 10-15 minutes and resolve. Patient with history of atrial fibrillation, currently on a regimen of Tikosyn and followed by EP (Dr. Lovena Le). Denies any noted racing heart or flushing throughout the day . Denies caffeine intake. Is trying to keep well-hydrated..   Past Medical History:  Diagnosis Date  . Anxiety   . Arthritis    "maybe in my back" (03/31/2018)  . Benign paroxysmal positional vertigo 06/08/2013  . Fracture of multiple ribs 2015   "don't know from what; dx'd when I in hospital for 1st back OR" (03/31/2018)  . GERD (gastroesophageal reflux disease)   . Hair loss 04/12/2012  . Herpes   . History of blood transfusion    "twice; related to back OR" (03/31/2018)  . History of kidney stones   . Interstitial cystitis 11/06/2011  . 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 Osteopenia 02/18/2012  . Osteoporosis   . PAF (paroxysmal atrial fibrillation) ([place]) 2012  . Peripheral neuropathy 11/06/2011  . PMDD (premenstrual dysphoric disorder)   . Seasonal allergies   . Vaginal delivery    ONE NSVD  . 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  acetaminophen (TYLENOL) 500 MG tablet Take 1,000 mg by mouth every 6 (six) hours as needed for mild pain.     . busPIRone (BUSPAR) 5 MG tablet Take 0.5 tablets (2.5 mg total) by mouth 3 (three) times daily. 60 tablet 3  . Calcium Carbonate Antacid (TUMS CHEWY BITES PO) Take 1 tablet by mouth daily as needed (reflux).     . Calcium Citrate-Vitamin D (CALCIUM + D PO) Take 1 tablet by mouth daily.    . clonazePAM (KLONOPIN) 0.5 MG tablet Take 1 tablet (0.5 mg total) by mouth 2 (two) times daily as needed for anxiety. 30 tablet 0  . cyclobenzaprine (FLEXERIL) 10 MG tablet Take 10 mg by mouth 3 (three) times daily as needed for muscle spasms.   5  . diltiazem (CARDIZEM CD) 120 MG 24 hr capsule Take 120 mg by mouth every evening.     . dofetilide (TIKOSYN) 250 MCG capsule Take 1 capsule (250 mcg total) by mouth 2 (two) times daily. 180 capsule 3  . furosemide (LASIX) 20 MG tablet Take 1 tablet (20 mg total) by mouth daily. 90 tablet 3  . gabapentin (NEURONTIN) 100 MG capsule Take 100 mg by mouth at bedtime.    . Magnesium 200 MG TABS Take 1 tablet (200 mg total) by mouth daily. 30 each   . meclizine (ANTIVERT) 25 MG tablet Take 1 tablet (25 mg total) by mouth 3 (three) times daily as needed for dizziness. 45 tablet 0  .  metoprolol tartrate (LOPRESSOR) 25 MG tablet Take 1 tablet (25 mg total) by mouth 2 (two) times daily. 60 tablet 11  . Nystatin POWD Apply small amount to affected area. 1 Bottle 3  . oxyCODONE (ROXICODONE) 5 MG/5ML solution Take 6.5 mg by mouth every 4 (four) hours as needed for moderate pain.     . polyethylene glycol (MIRALAX / GLYCOLAX) packet Take 17 g by mouth daily as needed for mild constipation.    . potassium chloride (K-DUR) 10 MEQ tablet Take 1 tablet (10 mEq total) by mouth daily. 30 tablet 4  . rivaroxaban (XARELTO) 20 MG TABS tablet Take 1 tablet (20 mg total) by mouth daily with supper. 90 tablet 2  . Teriparatide, Recombinant, (FORTEO) 600 MCG/2.4ML SOLN Inject 20 mcg  into the skin at bedtime.     . valACYclovir (VALTREX) 1000 MG tablet TK 2 TS PO TONIGHT AND 2 TS TOMORROW MORNING UTD  0   No current facility-administered medications on file prior to visit.     Allergies  Allergen Reactions  . Contrast Media [Iodinated Diagnostic Agents] Hives    unknown  . Cymbalta [Duloxetine Hcl]     Severe diarrhea and upset stomach  . Erythromycin     unknown  . Latex     hives  . Levofloxacin Nausea And Vomiting  . Nsaids     Other reaction(s): Other (See Comments) CANNOT TAKE PER CARDIOLOGIST DUE TO AFIB   . Other     CAN NOT TAKE , aLPRAZOLAM, OR ELAVIL DUE TO AFIB FOR ANXIETY  IV CONTRAST /"DYE".  . Oxycodone     Other reaction(s): Delusions (intolerance)  PILLS ONLY  . Septra [Bactrim]     Hives   . Sulfa Antibiotics     rash  . Adhesive [Tape] Rash and Other (See Comments)    Heart monitor stickers must be rotated in order to prevent rash  . Morphine Anxiety    Family History  Problem Relation Age of Onset  . Diabetes Father   . Hyperlipidemia Sister   . Heart disease Sister   . Stroke Sister   . Diabetes Brother   . Hyperlipidemia Sister   . Heart disease Sister   . Arthritis Mother   . Cancer Mother        uterus  . Heart disease Mother   . Diabetes Brother   . Heart Problems Brother     Social History   Socioeconomic History  . Marital status: Divorced    Spouse name: Not on file  . Number of children: Not on file  . Years of education: Not on file  . Highest education level: Not on file  Occupational History  . Not on file  Social Needs  . Financial resource strain: Not on file  . Food insecurity:    Worry: Not on file    Inability: Not on file  . Transportation needs:    Medical: Not on file    Non-medical: Not on file  Tobacco Use  . Smoking status: Former Smoker    Packs/day: 0.10    Years: 14.00    Pack years: 1.40    Types: Cigarettes  . Smokeless tobacco: Never Used  . Tobacco comment: 03/31/2018  "quit ~ 1980; someday smoker when I did smoke; never addicted"  Substance and Sexual Activity  . Alcohol use: Never    Frequency: Never  . Drug use: Yes    Types: Oxycodone    Comment: 03/31/2018 "for chronic neck  and back pain"  . Sexual activity: Not Currently  Lifestyle  . Physical activity:    Days per week: Not on file    Minutes per session: Not on file  . Stress: Not on file  Relationships  . Social connections:    Talks on phone: Not on file    Gets together: Not on file    Attends religious service: Not on file    Active member of club or organization: Not on file    Attends meetings of clubs or organizations: Not on file    Relationship status: Not on file  Other Topics Concern  . Not on file  Social History Narrative  . Not on file    Review of Systems - See HPI.  All other ROS are negative.  BP 112/70   Pulse 79   Temp (!) 97.5 F (36.4 C) (Oral)   Resp 14   Ht 5\' 6"  (1.676 m)   Wt 122 lb (55.3 kg)   SpO2 98%   BMI 19.69 kg/m   Physical Exam Vitals signs reviewed.  Constitutional:      Appearance: Normal appearance.  HENT:     Head: Normocephalic and atraumatic.     Right Ear: Tympanic membrane normal.     Left Ear: Tympanic membrane normal.     Nose: Nose normal.     Mouth/Throat:     Mouth: Mucous membranes are moist.  Eyes:     Conjunctiva/sclera: Conjunctivae normal.  Neck:     Musculoskeletal: Normal range of motion and neck supple.  Cardiovascular:     Rate and Rhythm: Normal rate and regular rhythm.     Heart sounds: Normal heart sounds.  Skin:    Comments: No noted rash or flushing of face, trunk or extremities  Neurological:     General: No focal deficit present.     Mental Status: She is alert and oriented to person, place, and time.  Psychiatric:        Mood and Affect: Mood normal.     Recent Results (from the past 2160 hour(s))  Basic metabolic panel     Status: Abnormal   Collection Time: 07/26/18 11:13 AM  Result Value  Ref Range   Sodium 143 135 - 145 mmol/L   Potassium 3.9 3.5 - 5.1 mmol/L   Chloride 106 98 - 111 mmol/L   CO2 30 22 - 32 mmol/L   Glucose, Bld 107 (H) 70 - 99 mg/dL   BUN 16 8 - 23 mg/dL   Creatinine, Ser 0.85 0.44 - 1.00 mg/dL   Calcium 9.7 8.9 - 10.3 mg/dL   GFR calc non Af Amer >60 >60 mL/min   GFR calc Af Amer >60 >60 mL/min   Anion gap 7 5 - 15    Comment: Performed at [facility], [place] [address] [place], [place] [address]  Magnesium     Status: None   Collection Time: 07/26/18 11:13 AM  Result Value Ref Range   Magnesium 1.8 1.7 - 2.4 mg/dL    Comment: Performed at [facility] [address] [place], [place] [address]  HM MAMMOGRAPHY     Status: None   Collection Time: 08/12/18 12:00 AM  Result Value Ref Range   HM Mammogram 0-4 Bi-Rad 0-4 Bi-Rad, Self Reported Normal    Comment: bi-rads 0  HM MAMMOGRAPHY     Status: None   Collection Time: 08/12/18 12:00 AM  Result Value Ref Range   HM  Mammogram 0-4 Bi-Rad 0-4 Bi-Rad, Self Reported Normal    Comment: Bi-rads 1    Assessment/Plan: 1. Paroxysmal atrial fibrillation (HCC) Concern these episodes at night are her slipping back into an arrhythmia. NSR today in office. Vitals stable. No caffeine or alcohol. Continue medications as directed. Will check TSH today and have her schedule follow-up with Dr. Lovena Le as she will likely need repeat Holter study. - TSH  2. Facial flushing Based on descriptors, question if this is a intolerance to her makeup that she has developed or solely consequence of hot water use and scrubbing her face. No true rash or hives per patient. Will have her cut back on temp of water used when washing/showering. Hold off on her makeup for the next week. Will monitor symptoms with this. Labs today. - CBC w/Diff - Basic metabolic panel - TSH   Leeanne Rio, PA-C

## 2018-08-25 NOTE — Patient Instructions (Signed)
Please go to the lab today for blood work.  I will call you with your results. We will alter treatment regimen(s) if indicated by your results.   I want you to avoid use of your makeup for the next few days to see if the facial irritation calms down. Ok to continue a gentle soap like Dove or Cetaphil for cleansing. Cut back on hot water use for this.   For the other symptoms, I am going to work on getting you set back up with Cardiology to further assess as I am concerned that you may be going back into a fib at night. We will see what labs say first.  Continue medications as directed.

## 2018-08-25 NOTE — Telephone Encounter (Signed)
Contacted pt to discuss symptoms; she says that she has been experiencing  redness in her face that happens in the mornings and at night she has spoken with a nurse before even though she changed soaps (going on for several weeks) her cheeks and entire face are affected; she also complains of having episodes of flushing and hot/cold flashes; the pt says that she is on a liquid oxycodone medicine for her back and neck pain and she wonders if she is having a reaction to the medication; the pt also wanted to know if that could be a reason for her having episodes of feeling flushed and hot then cold for at least 2-3 weeks; the pt says that it has worsened over the past week and it wakes it up from her sleep; she says that she medicine for 3 years; the pt says that there is no correlation with these symptoms and taking her medication, but her symptoms get better as the day progresses; the pt says that she has had no energy, and not feeling well for the past 5-7 days; recommendations made per nurse triage protocol; the pt would like to see [name] today but she has no availability; spoke with Joellen Jersey in regards to scheduling pt; pt offered and accepted appointment with Raiford Noble, [place], [facility], 08/25/2018 at 1030; she verbalized understanding; will route to office for notification. Reason for Disposition . Localized rash present > 7 days  Answer Assessment - Initial Assessment Questions 1. APPEARANCE of RASH: "Describe the rash."      red 2. LOCATION: "Where is the rash located?"      Face and cheeks 3. NUMBER: "How many spots are there?"      To many to count 4. SIZE: "How big are the spots?" (Inches, centimeters or compare to size of a coin)      Entire face and cheeks covered 5. ONSET: "When did the rash start?"      2-3 weeks ago 6. ITCHING: "Does the rash itch?" If so, ask: "How bad is the itch?"  (Scale 1-10; or mild, moderate, severe)     no 7. PAIN: "Does the rash hurt?" If so, ask: "How  bad is the pain?"  (Scale 1-10; or mild, moderate, severe)     no 8. OTHER SYMPTOMS: "Do you have any other symptoms?" (e.g., fever)     Feels like flushed; also has episodes of feeling "hot and cold, like can't regulate body temperature"; "no energy, not feeling well for 5-7 days" 9. PREGNANCY: "Is there any chance you are pregnant?" "When was your last menstrual period?"     no  Protocols used: RASH OR REDNESS - LOCALIZED-A-AH

## 2018-08-26 DIAGNOSIS — J309 Allergic rhinitis, unspecified: Secondary | ICD-10-CM | POA: Diagnosis not present

## 2018-08-26 DIAGNOSIS — H608X9 Other otitis externa, unspecified ear: Secondary | ICD-10-CM | POA: Diagnosis not present

## 2018-08-26 DIAGNOSIS — H6120 Impacted cerumen, unspecified ear: Secondary | ICD-10-CM | POA: Diagnosis not present

## 2018-08-26 DIAGNOSIS — R040 Epistaxis: Secondary | ICD-10-CM | POA: Diagnosis not present

## 2018-09-01 ENCOUNTER — Ambulatory Visit: Payer: Self-pay | Admitting: *Deleted

## 2018-09-01 ENCOUNTER — Other Ambulatory Visit: Payer: Self-pay | Admitting: Physician Assistant

## 2018-09-01 DIAGNOSIS — R232 Flushing: Secondary | ICD-10-CM

## 2018-09-01 DIAGNOSIS — I480 Paroxysmal atrial fibrillation: Secondary | ICD-10-CM

## 2018-09-01 DIAGNOSIS — R002 Palpitations: Secondary | ICD-10-CM

## 2018-09-01 NOTE — Telephone Encounter (Signed)
I agree. Patient please make her aware of Dr. Virgil Benedict input as well

## 2018-09-01 NOTE — Telephone Encounter (Signed)
Patina called and informed pt.

## 2018-09-01 NOTE — Telephone Encounter (Signed)
As we have previously discussed, now that she is on Tikosyn I am not able to prescribe her anything else for her anxiety.  I have strongly recommended that she see Psychiatry as they need to address her ongoing anxiety as this is negatively impacting her health.  Cody and I discussed her visit and her palpitations are NOT from a skin issue- her skin redness was from her makeup and scrubbing her face.  This has nothing to do w/ her palpitations for which he told her to follow up w/ Cardiology since she has Afib and is waking up w/ palpitations.  She can follow w/ Dr Wynonia Lawman OR Afib clinic- she needs to see Cardiology, it doesn't matter which provider.

## 2018-09-01 NOTE — Telephone Encounter (Signed)
Pt calling back. States she is still concerned about scheduling appt with Dr. Lovena Le vs. [facility].  States she had called earlier and spoke to nurse at office who gave her message from [place]. States symptoms "May be from a skin issue."  Advised pt to follow PCPs recommendation and see Dr. Lovena Le for further assessment as advised. Also states she discussed following issue with nurse this am. Did not see note regarding conversation..... Pt doesn't feel  (BUSPAR) 5 MG is helping her anxiety at all. States she feels more anxious since starting medication and is crying more often. Pt questioning "If  this is normal, if she should stop the medication or try a different medication. Pt can be reached at [phone]  Reason for Disposition . [1] Caller requesting NON-URGENT health information AND [2] PCP's office is the best resource  Answer Assessment - Initial Assessment Questions 1. REASON FOR CALL or QUESTION: "What is your reason for calling today?" or "How can I best help you?" or "What question do you have that I can help answer?"     Multiple questions regarding appts with cardiology.  Protocols used: INFORMATION ONLY CALL-A-AH

## 2018-09-03 ENCOUNTER — Ambulatory Visit: Payer: Self-pay | Admitting: *Deleted

## 2018-09-03 NOTE — Telephone Encounter (Signed)
Summary: medication question    busPIRone (BUSPAR) 5 MG tablet [[id number]] pt called and stated that she would like to know if she still needs to take this medication because she thinks that it is making her anxiety worse. Please advise      Patient has stopped medication the beginning of the week- and she reports she is crying less. Patient does not want to continue and she will await PCP response.  Patient is trying to get appointment with councilor. Patient is looking for psychiatrist that can prescribe medication. Patient is working on appointment in April. Patient wants PCP to know that she is really trying to find a councilor.  Reason for Disposition . Caller has NON-URGENT medication question about med that PCP prescribed and triager unable to answer question    Patient has discontinued medication due to increased symptoms- she states she feels better off of it.  Answer Assessment - Initial Assessment Questions 1. SYMPTOMS: "Do you have any symptoms?"     crying more, increased anxiety, irritable  2. SEVERITY: If symptoms are present, ask "Are they mild, moderate or severe?"     Severe- not better at all.  Protocols used: MEDICATION QUESTION CALL-A-AH

## 2018-09-05 NOTE — Telephone Encounter (Signed)
Ok to stop medication (she has already done so)

## 2018-09-06 NOTE — Telephone Encounter (Signed)
Patient notified of PCP recommendations and is agreement and expresses an understanding.   Ok for PEC to Discuss results / PCP recommendations / Schedule patient.   

## 2018-09-17 ENCOUNTER — Other Ambulatory Visit: Payer: Self-pay

## 2018-09-17 ENCOUNTER — Encounter: Payer: Self-pay | Admitting: Internal Medicine

## 2018-09-17 ENCOUNTER — Ambulatory Visit (INDEPENDENT_AMBULATORY_CARE_PROVIDER_SITE_OTHER): Payer: Medicare Other | Admitting: Internal Medicine

## 2018-09-17 VITALS — BP 136/80 | HR 79 | Ht 66.0 in | Wt 124.0 lb

## 2018-09-17 DIAGNOSIS — I480 Paroxysmal atrial fibrillation: Secondary | ICD-10-CM | POA: Diagnosis not present

## 2018-09-17 DIAGNOSIS — Z79899 Other long term (current) drug therapy: Secondary | ICD-10-CM | POA: Diagnosis not present

## 2018-09-17 DIAGNOSIS — Z5181 Encounter for therapeutic drug level monitoring: Secondary | ICD-10-CM | POA: Diagnosis not present

## 2018-09-17 NOTE — Patient Instructions (Signed)
Medication Instructions:  Your physician recommends that you continue on your current medications as directed. Please refer to the Current Medication list given to you today.  Labwork: None ordered.  Testing/Procedures: None ordered.  Follow-Up: Your physician wants you to follow-up in: as previously advised (6 months with [name]).  A recall has been entered.  Any Other Special Instructions Will Be Listed Below (If Applicable).  If you need a refill on your cardiac medications before your next appointment, please call your pharmacy.

## 2018-09-17 NOTE — Progress Notes (Signed)
HPI [name] is referred today for evaluation of facial flushing. She is a pleasant, anxious 73 yo woman with PAF who was started on dofetilide several months ago. She has maintained NSR very nicely. The patient denies medical non-compliance and has not missed or dofetilide. No palpitations. She was in her primary MD's office and saw PA Raiford Noble with complaints of facial flushing. She is referred for additional evaluation. She notes that at night when she sleeps she gets hot and get up and goes to the bathroom sweating and when she comes back to bed she is cold. When she awakens in the morning and looks in the mirror she has some redness on her face. No other symptoms. No fever or chills. Allergies  Allergen Reactions  . Contrast Media [Iodinated Diagnostic Agents] Hives    unknown  . Cymbalta [Duloxetine Hcl]     Severe diarrhea and upset stomach  . Erythromycin     unknown  . Latex     hives  . Levofloxacin Nausea And Vomiting  . Nsaids     Other reaction(s): Other (See Comments) CANNOT TAKE PER CARDIOLOGIST DUE TO AFIB   . Other     CAN NOT TAKE , aLPRAZOLAM, OR ELAVIL DUE TO AFIB FOR ANXIETY  IV CONTRAST /"DYE".  . Oxycodone     Other reaction(s): Delusions (intolerance)  PILLS ONLY  . Septra [Bactrim]     Hives   . Sulfa Antibiotics     rash  . Adhesive [Tape] Rash and Other (See Comments)    Heart monitor stickers must be rotated in order to prevent rash  . Morphine Anxiety     Current Outpatient Medications  Medication Sig Dispense Refill  . acetaminophen (TYLENOL) 500 MG tablet Take 1,000 mg by mouth every 6 (six) hours as needed for mild pain.     . busPIRone (BUSPAR) 5 MG tablet Take 0.5 tablets (2.5 mg total) by mouth 3 (three) times daily. 60 tablet 3  . Calcium Carbonate Antacid (TUMS CHEWY BITES PO) Take 1 tablet by mouth daily as needed (reflux).     . Calcium Citrate-Vitamin D (CALCIUM + D PO) Take 1 tablet by mouth daily.    . clonazePAM  (KLONOPIN) 0.5 MG tablet Take 1 tablet (0.5 mg total) by mouth 2 (two) times daily as needed for anxiety. 30 tablet 0  . cyclobenzaprine (FLEXERIL) 10 MG tablet Take 10 mg by mouth 3 (three) times daily as needed for muscle spasms.   5  . diltiazem (CARDIZEM CD) 120 MG 24 hr capsule Take 120 mg by mouth every evening.     . dofetilide (TIKOSYN) 250 MCG capsule Take 1 capsule (250 mcg total) by mouth 2 (two) times daily. 180 capsule 3  . furosemide (LASIX) 20 MG tablet Take 1 tablet (20 mg total) by mouth daily. 90 tablet 3  . gabapentin (NEURONTIN) 100 MG capsule Take 100 mg by mouth at bedtime.    . Magnesium 200 MG TABS Take 1 tablet (200 mg total) by mouth daily. 30 each   . meclizine (ANTIVERT) 25 MG tablet Take 1 tablet (25 mg total) by mouth 3 (three) times daily as needed for dizziness. 45 tablet 0  . metoprolol tartrate (LOPRESSOR) 25 MG tablet Take 1 tablet (25 mg total) by mouth 2 (two) times daily. 60 tablet 11  . Nystatin POWD Apply small amount to affected area. 1 Bottle 3  . oxyCODONE (ROXICODONE) 5 MG/5ML solution Take 6.5 mg by  mouth every 4 (four) hours as needed for moderate pain.     . polyethylene glycol (MIRALAX / GLYCOLAX) packet Take 17 g by mouth daily as needed for mild constipation.    . potassium chloride (K-DUR) 10 MEQ tablet Take 1 tablet (10 mEq total) by mouth daily. 30 tablet 4  . rivaroxaban (XARELTO) 20 MG TABS tablet Take 1 tablet (20 mg total) by mouth daily with supper. 90 tablet 2  . Teriparatide, Recombinant, (FORTEO) 600 MCG/2.4ML SOLN Inject 20 mcg into the skin at bedtime.     . valACYclovir (VALTREX) 1000 MG tablet TK 2 TS PO TONIGHT AND 2 TS TOMORROW MORNING UTD  0   No current facility-administered medications for this visit.      Past Medical History:  Diagnosis Date  . Anxiety   . Arthritis    "maybe in my back" (03/31/2018)  . Benign paroxysmal positional vertigo 06/08/2013  . Fracture of multiple ribs 2015   "don't know from what; dx'd when I  in hospital for 1st back OR" (03/31/2018)  . GERD (gastroesophageal reflux disease)   . Hair loss 04/12/2012  . Herpes   . History of blood transfusion    "twice; related to back OR" (03/31/2018)  . History of kidney stones   . Interstitial cystitis 11/06/2011  . 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 Osteopenia 02/18/2012  . Osteoporosis   . PAF (paroxysmal atrial fibrillation) ([place]) 2012  . Peripheral neuropathy 11/06/2011  . PMDD (premenstrual dysphoric disorder)   . Seasonal allergies   . Vaginal delivery    ONE NSVD  . 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   Past Surgical History:  Procedure Laterality Date  . ANTERIOR CERVICAL DECOMP/DISCECTOMY FUSION  ~ 2003  . BACK SURGERY    . BREAST SURGERY     BREAST BIOPSY--RIGHT BENIGN  . BUNIONECTOMY Bilateral   . COSMETIC SURGERY  2016   "back of my neck; related to earlier fusion"  . CYSTOSCOPY W/ STONE MANIPULATION  "several times"  . DILATION AND CURETTAGE OF UTERUS    . FOREHEAD RECONSTRUCTION Right    "removed bone protruding out of my forehead"  . HARDWARE REMOVAL  2016   "related to neck OR"  . INCONTINENCE SURGERY    . POSTERIOR CERVICAL FUSION/FORAMINOTOMY  ~ 2008; 2015  . SHOULDER ARTHROSCOPY W/ ROTATOR CUFF REPAIR Right 2012  . SPINAL FUSION  06/2014 - 2018 X ?7   "scoliosis; my entire back"  . TUBAL LIGATION    . VAGINAL HYSTERECTOMY     TV[facility]     Family History  Problem Relation Age of Onset  . Diabetes Father   . Hyperlipidemia Sister   . Heart disease Sister   . Stroke Sister   . Diabetes Brother   . Hyperlipidemia Sister   . Heart disease Sister   . Arthritis Mother   . Cancer Mother        uterus  . Heart disease Mother   . Diabetes Brother   . Heart Problems Brother      Social History   Socioeconomic History  . Marital status: Divorced    Spouse name: Not on file  . Number of children: Not on file  . Years of education: Not  on file  . Highest education level: Not on file  Occupational History  . Not on file  Social Needs  . Financial  resource strain: Not on file  . Food insecurity:    Worry: Not on file    Inability: Not on file  . Transportation needs:    Medical: Not on file    Non-medical: Not on file  Tobacco Use  . Smoking status: Former Smoker    Packs/day: 0.10    Years: 14.00    Pack years: 1.40    Types: Cigarettes  . Smokeless tobacco: Never Used  . Tobacco comment: 03/31/2018 "quit ~ 1980; someday smoker when I did smoke; never addicted"  Substance and Sexual Activity  . Alcohol use: Never    Frequency: Never  . Drug use: Yes    Types: Oxycodone    Comment: 03/31/2018 "for chronic neck and back pain"  . Sexual activity: Not Currently  Lifestyle  . Physical activity:    Days per week: Not on file    Minutes per session: Not on file  . Stress: Not on file  Relationships  . Social connections:    Talks on phone: Not on file    Gets together: Not on file    Attends religious service: Not on file    Active member of club or organization: Not on file    Attends meetings of clubs or organizations: Not on file    Relationship status: Not on file  . Intimate partner violence:    Fear of current or ex partner: Not on file    Emotionally abused: Not on file    Physically abused: Not on file    Forced sexual activity: Not on file  Other Topics Concern  . Not on file  Social History Narrative  . Not on file     BP 136/80   Pulse 79   Ht 5\' 6"  (1.676 m)   Wt 124 lb (56.2 kg)   SpO2 96%   BMI 20.01 kg/m   Physical Exam:  Well appearing NAD HEENT: Unremarkable Neck:  No JVD, no thyromegally Lymphatics:  No adenopathy Back:  No CVA tenderness Lungs:  Clear with no wheezes HEART:  Regular rate rhythm, no murmurs, no rubs, no clicks Abd:  soft, positive bowel sounds, no organomegally, no rebound, no guarding Ext:  2 plus pulses, no edema, no cyanosis, no clubbing Skin:  No  rashes no nodules Neuro:  CN II through XII intact, motor grossly intact  DEVICE  Normal device function.  See PaceArt for details.   Assess/Plan: 1. PAF - she is maintaining NSR on dofetilide. She will continue 2. HTN - her blood pressure is up a little. We discussed taking her meds and suggested she take both her calcium channel blocker and beta blocker in the morning.  3. Anxiety - I tried to reassure the patient.  4. Facial flushing - currently it has resolved. I would not do any additional workup at this time.   Mikle Bosworth.D.

## 2018-10-01 DIAGNOSIS — M4693 Unspecified inflammatory spondylopathy, cervicothoracic region: Secondary | ICD-10-CM | POA: Diagnosis not present

## 2018-10-01 DIAGNOSIS — M961 Postlaminectomy syndrome, not elsewhere classified: Secondary | ICD-10-CM | POA: Diagnosis not present

## 2018-10-01 DIAGNOSIS — G894 Chronic pain syndrome: Secondary | ICD-10-CM | POA: Diagnosis not present

## 2018-10-21 ENCOUNTER — Ambulatory Visit: Payer: Self-pay

## 2018-10-21 ENCOUNTER — Telehealth: Payer: Self-pay | Admitting: Family Medicine

## 2018-10-21 ENCOUNTER — Ambulatory Visit (INDEPENDENT_AMBULATORY_CARE_PROVIDER_SITE_OTHER): Payer: Medicare Other | Admitting: Family Medicine

## 2018-10-21 ENCOUNTER — Encounter: Payer: Self-pay | Admitting: Family Medicine

## 2018-10-21 ENCOUNTER — Other Ambulatory Visit: Payer: Self-pay

## 2018-10-21 DIAGNOSIS — F419 Anxiety disorder, unspecified: Secondary | ICD-10-CM | POA: Diagnosis not present

## 2018-10-21 DIAGNOSIS — F329 Major depressive disorder, single episode, unspecified: Secondary | ICD-10-CM

## 2018-10-21 DIAGNOSIS — F32A Depression, unspecified: Secondary | ICD-10-CM

## 2018-10-21 MED ORDER — CLONAZEPAM 0.5 MG PO TABS: 0.5000 mg | ORAL_TABLET | Freq: Two times a day (BID) | ORAL | 1 refills | Status: DC | PRN

## 2018-10-21 NOTE — Telephone Encounter (Signed)
Pt called stating that she has felt SOB.  She states that she finds it difficult to breath through a mask she wears when she is outside. Pt was assured that this was a normal sensation when wearing a mask.  She went on to report that she has Afib and has been experiencing breathing difficulties since starting heart medications.  She states that she feels SOB at night and day. She reports no cough. She sleeps on extra pillows for back and neck problems. She denies other symptoms. No edema. She states she has allergies that cause her a runny nose and watery eyes.  She has had allergy symptoms for several months. She is worried and tries not to watch TV to often. She states that last night the SOB woke her from sleep. Per protocol call was transferred to office for appointment scheduling.  Reason for Disposition . [1] MODERATE longstanding difficulty breathing (e.g., speaks in phrases, SOB even at rest, pulse 100-120) AND [2] SAME as normal  Answer Assessment - Initial Assessment Questions 1. RESPIRATORY STATUS: "Describe your breathing?" (e.g., wheezing, shortness of breath, unable to speak, severe coughing)      SOB 2. ONSET: "When did this breathing problem begin?"      2 days ago 3. PATTERN "Does the difficult breathing come and go, or has it been constant since it started?"     Comes and goes 4. SEVERITY: "How bad is your breathing?" (e.g., mild, moderate, severe)    - MILD: No SOB at rest, mild SOB with walking, speaks normally in sentences, can lay down, no retractions, pulse < 100.    - MODERATE: SOB at rest, SOB with minimal exertion and prefers to sit, cannot lie down flat, speaks in phrases, mild retractions, audible wheezing, pulse 100-120.    - SEVERE: Very SOB at rest, speaks in single words, struggling to breathe, sitting hunched forward, retractions, pulse > 120      With rest 5. RECURRENT SYMPTOM: "Have you had difficulty breathing before?" If so, ask: "When was the last time?" and  "What happened that time?"      When first started on heart medications and since Afib 6. CARDIAC HISTORY: "Do you have any history of heart disease?" (e.g., heart attack, angina, bypass surgery, angioplasty)      Afib, mitral valve prolapse 7. LUNG HISTORY: "Do you have any history of lung disease?"  (e.g., pulmonary embolus, asthma, emphysema)    no 8. CAUSE: "What do you think is causing the breathing problem?"    Unsure possibly her heart medication 9. OTHER SYMPTOMS: "Do you have any other symptoms? (e.g., dizziness, runny nose, cough, chest pain, fever)     Allergies eyes water and runny nose for several months 10. PREGNANCY: "Is there any chance you are pregnant?" "When was your last menstrual period?"      N/A 11. TRAVEL: "Have you traveled out of the country in the last month?" (e.g., travel history, exposures)      No  Protocols used: BREATHING DIFFICULTY-A-AH

## 2018-10-21 NOTE — Progress Notes (Signed)
Virtual Visit via Telephone Note  I connected with [name] on 10/21/18 at  3:40 PM EDT by telephone and verified that I am speaking with the correct person using two identifiers.   I discussed the limitations, risks, security and privacy concerns of performing an evaluation and management service by telephone and the availability of in person appointments. I also discussed with the patient that there may be a patient responsible charge related to this service. The patient expressed understanding and agreed to proceed.  Pt is at home and I am in the office  History of Present Illness: SOB- pt reports this has worsened in the last week.  'like a smothering feeling'.  Increased stress w/ COVID situation and her baseline stress level/anxiety is quite high.  Pt has had intermittent SOB for quite some time- had CXR for this in December.  CXR was unremarkable at that time.  Breathing issues are intermittent/episodic.  SOB relieved w/ Oxy or Clonazepam.  Has not scheduled w/ psychiatrist or therapist as recommended.  'I would give anything for a pill to help w/ my anxiety'.   Observations/Objective: Pt is able to speak clearly, coherently without shortness of breath or increased work of breathing.  Thought process is linear.  Mood is appropriate.   Assessment and Plan: Anxiety- deteriorated in setting of COVID crisis and it was not well controlled previously.  After multiple SSRIs, Wellbutrin, Buspar nothing has worked for pt 'the way Klonopin does'.  Suspect her SOB is all anxiety related as she is followed by a cardiologist and had a normal CXR in December and her sxs are relieved w/ Oxy or Klonopin.  Restart Clonazepam at a low dose to improve anxiety and monitor for sxs improvement.  Pt is to call counselor tomorrow and schedule appt.  Pt expressed understanding and is in agreement w/ plan.   Follow Up Instructions: 2 weeks or PRN   I discussed the assessment and treatment plan with the  patient. The patient was provided an opportunity to ask questions and all were answered. The patient agreed with the plan and demonstrated an understanding of the instructions.   The patient was advised to call back or seek an in-person evaluation if the symptoms worsen or if the condition fails to improve as anticipated.  I provided 23 minutes of non-face-to-face time during this encounter.   [name], MD

## 2018-10-21 NOTE — Telephone Encounter (Signed)
Copied from [place] [id number]. Topic: Quick Communication - See Telephone Encounter >> Oct 21, 2018  6:05 PM Ivar Drape wrote: CRM for notification. See Telephone encounter for: 10/21/18. The patient would like her clonazePAM (KLONOPIN) 0.5 MG prescription pulled from [facility] and sent to [facility], [address] [place], [place]  [place]: [phone].

## 2018-10-22 ENCOUNTER — Other Ambulatory Visit: Payer: Self-pay

## 2018-10-22 ENCOUNTER — Other Ambulatory Visit (HOSPITAL_COMMUNITY): Payer: Self-pay | Admitting: Psychiatry

## 2018-10-22 NOTE — Telephone Encounter (Signed)
Last refill: Last OV:10/21/18

## 2018-10-25 ENCOUNTER — Telehealth: Payer: Self-pay | Admitting: *Deleted

## 2018-10-25 ENCOUNTER — Telehealth (HOSPITAL_COMMUNITY): Payer: Self-pay | Admitting: *Deleted

## 2018-10-25 NOTE — Telephone Encounter (Signed)
Patient called in very tearful and anxious stating she had her worst episode of afib since being on tikosyn yesterday evening. She did not take her BP/HR as she was terrified of the results and knew she couldn't go to the ER if they were bad. She did take an extra 1/2 of metoprolol and an extra klonopin and the episode finally subsided. Educated pt on necessity to take her bp/hr when in afib prior to taking extra medications. Also discussed when to proceed to ER and that the ER is still an option of medical emergencies. Pt would like to go ahead and consult with [name] regarding possibility of ablation in the future. She feels like if she has a plan in place for afib treatment this may lessen her anxiety regarding afib. Encouraged pt to follow up with counselor as her PCP had recommended. Pt has a tremendous amount of anxiety related to the possibility of breakthrough afib. Encouraged pt to use her resources of afib clinic/after hours physician with afib episodes to help reduce her anxiety related to afib. Pt felt better after the call and I have reached out to [facility], RN to get consult with [name] via virtual visit set up in the next few weeks. Pt in agreement will call if issues.

## 2018-10-25 NOTE — Telephone Encounter (Signed)
Scheduled pt for virtual OV w/ [place] Thursday, 4/23, per referral from AFib clinic. Pt agreeable to virtual visit.     Virtual Visit Pre-Appointment Phone Call  Steps For Call:  1. Confirm consent - "In the setting of the current Covid19 crisis, you are scheduled for a (phone or video) visit with your provider on (date) at (time).  Just as we do with many in-office visits, in order for you to participate in this visit, we must obtain consent.  If you'd like, I can send this to your mychart (if signed up) or email for you to review.  Otherwise, I can obtain your verbal consent now.  All virtual visits are billed to your insurance company just like a normal visit would be.  By agreeing to a virtual visit, we'd like you to understand that the technology does not allow for your provider to perform an examination, and thus may limit your provider's ability to fully assess your condition. If your provider identifies any concerns that need to be evaluated in person, we will make arrangements to do so.  Finally, though the technology is pretty good, we cannot assure that it will always work on either your or our end, and in the setting of a video visit, we may have to convert it to a phone-only visit.  In either situation, we cannot ensure that we have a secure connection.  Are you willing to proceed?" STAFF: Did the patient verbally acknowledge consent to telehealth visit? Document YES/NO here: YES  2. Confirm the BEST phone number to call the day of the visit by including in appointment notes  3. Give patient instructions for MyChart download to smartphone OR Doximity/[email or web address] as below if video visit (depending on what platform provider is using)  4. Confirm that appointment type is correct in Epic appointment notes (VIDEO vs PHONE)  5. Advise patient to be prepared with their blood pressure, heart rate, weight, any heart rhythm information, their current medicines, and a piece of paper and pen handy  for any instructions they may receive the day of their visit  6. Inform patient they will receive a phone call 15 minutes prior to their appointment time (may be from unknown caller ID) so they should be prepared to answer    TELEPHONE CALL NOTE  [name] has been deemed a candidate for a follow-up tele-health visit to limit community exposure during the Covid-19 pandemic. I spoke with the patient via phone to ensure availability of phone/video source, confirm preferred email & phone number, and discuss instructions and expectations.  I reminded [name] to be prepared with any vital sign and/or heart rhythm information that could potentially be obtained via home monitoring, at the time of her visit. I reminded [name] to expect a phone call prior to her visit.  Stanton Kidney, RN 10/25/2018 3:42 PM   INSTRUCTIONS FOR DOWNLOADING THE MYCHART APP TO SMARTPHONE  - The patient must first make sure to have activated MyChart and know their login information - If Apple, go to [employer] and type in MyChart in the search bar and download the app. If Android, ask patient to go to [employer] and type in [place] in the search bar and download the app. The app is free but as with any other app downloads, their phone may require them to verify saved payment information or Apple/Android password.  - The patient will need to then log into the app with their MyChart  username and password, and select [facility] as their healthcare provider to link the account. When it is time for your visit, go to the MyChart app, find appointments, and click Begin Video Visit. Be sure to Select Allow for your device to access the Microphone and Camera for your visit. You will then be connected, and your provider will be with you shortly.  **If they have any issues connecting, or need assistance please contact MyChart service desk [phone])**  **If using a computer, in order to ensure the  best quality for their visit they will need to use either of the following Internet Browsers: [employer], or Google Chrome**  IF USING DOXIMITY or DOXY.ME - The patient will receive a link just prior to their visit by text.     FULL LENGTH CONSENT FOR TELE-HEALTH VISIT   I hereby voluntarily request, consent and authorize [facility] and its employed or contracted physicians, physician assistants, nurse practitioners or other licensed health care professionals (the Practitioner), to provide me with telemedicine health care services (the "Services") as deemed necessary by the treating Practitioner. I acknowledge and consent to receive the Services by the Practitioner via telemedicine. I understand that the telemedicine visit will involve communicating with the Practitioner through live audiovisual communication technology and the disclosure of certain medical information by electronic transmission. I acknowledge that I have been given the opportunity to request an in-person assessment or other available alternative prior to the telemedicine visit and am voluntarily participating in the telemedicine visit.  I understand that I have the right to withhold or withdraw my consent to the use of telemedicine in the course of my care at any time, without affecting my right to future care or treatment, and that the Practitioner or I may terminate the telemedicine visit at any time. I understand that I have the right to inspect all information obtained and/or recorded in the course of the telemedicine visit and may receive copies of available information for a reasonable fee.  I understand that some of the potential risks of receiving the Services via telemedicine include:  [name] Delay or interruption in medical evaluation due to technological equipment failure or disruption; . Information transmitted may not be sufficient (e.g. poor resolution of images) to allow for appropriate medical decision making by the  Practitioner; and/or  . In rare instances, security protocols could fail, causing a breach of personal health information.  Furthermore, I acknowledge that it is my responsibility to provide information about my medical history, conditions and care that is complete and accurate to the best of my ability. I acknowledge that Practitioner's advice, recommendations, and/or decision may be based on factors not within their control, such as incomplete or inaccurate data provided by me or distortions of diagnostic images or specimens that may result from electronic transmissions. I understand that the practice of medicine is not an exact science and that Practitioner makes no warranties or guarantees regarding treatment outcomes. I acknowledge that I will receive a copy of this consent concurrently upon execution via email to the email address I last provided but may also request a printed copy by calling the office of [facility].    I understand that my insurance will be billed for this visit.   I have read or had this consent read to me. . I understand the contents of this consent, which adequately explains the benefits and risks of the Services being provided via telemedicine.  . I have been provided ample opportunity to  ask questions regarding this consent and the Services and have had my questions answered to my satisfaction. . I give my informed consent for the services to be provided through the use of telemedicine in my medical care  By participating in this telemedicine visit I agree to the above.

## 2018-10-28 ENCOUNTER — Other Ambulatory Visit: Payer: Self-pay

## 2018-10-28 ENCOUNTER — Encounter: Payer: Self-pay | Admitting: Cardiology

## 2018-10-28 ENCOUNTER — Telehealth (INDEPENDENT_AMBULATORY_CARE_PROVIDER_SITE_OTHER): Payer: Medicare Other | Admitting: Cardiology

## 2018-10-28 ENCOUNTER — Telehealth: Payer: Self-pay | Admitting: Cardiology

## 2018-10-28 DIAGNOSIS — I480 Paroxysmal atrial fibrillation: Secondary | ICD-10-CM

## 2018-10-28 NOTE — Progress Notes (Signed)
Electrophysiology TeleHealth Note   Due to national recommendations of social distancing due to COVID 19, an audio/video telehealth visit is felt to be most appropriate for this patient at this time.  See Epic message for the patient's consent to telehealth for [facility].   Date:  10/28/2018   ID:  Regina, [name] 13-Jan-1946, MRN [id number]  Location: patient's home  Provider location: [address], [place]  Evaluation Performed: Follow-up visit  PCP:  [name], MD  Cardiologist:  Lovena Le Electrophysiologist: Lovena Le,  Dr Curt Bears  Chief Complaint:  Atrial fibrillation  History of Present Illness:    [name] is a 73 y.o. female who presents via audio/video conferencing for a telehealth visit today.  Since last being seen in our clinic, the patient reports doing very well.  Today, she denies symptoms of palpitations, chest pain, shortness of breath,  lower extremity edema, dizziness, presyncope, or syncope.  The patient is otherwise without complaint today.  The patient denies symptoms of fevers, chills, cough, or new SOB worrisome for COVID 19.  [name] is a 73 y.o. female who is being seen today for the evaluation of atrial fibrillation at the request of Cristopher Peru.  She has a history of anxiety and panic disorder as well as atrial fibrillation.  She is currently on dofetilide.  Today, denies symptoms of palpitations, chest pain, shortness of breath, orthopnea, PND, lower extremity edema, claudication, dizziness, presyncope, syncope, bleeding, or neurologic sequela. The patient is tolerating medications without difficulties. She continues to have issues with atrial fibrillation.  She had atrial fibrillation on Sunday which lasted a few hours.  She took an extra dose of metoprolol which improved her symptoms.  She has had a few episodes similar to this since starting her dofetilide.  She is quite anxious about going into atrial fibrillation.    Past Medical History:  Diagnosis Date  . Anxiety   . Arthritis    "maybe in my back" (03/31/2018)  . Benign paroxysmal positional vertigo 06/08/2013  . Fracture of multiple ribs 2015   "don't know from what; dx'd when I in hospital for 1st back OR" (03/31/2018)  . GERD (gastroesophageal reflux disease)   . Hair loss 04/12/2012  . Herpes   . History of blood transfusion    "twice; related to back OR" (03/31/2018)  . History of kidney stones   . Interstitial cystitis 11/06/2011  . 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 Osteopenia 02/18/2012  . Osteoporosis   . PAF (paroxysmal atrial fibrillation) ([place]) 2012  . Peripheral neuropathy 11/06/2011  . PMDD (premenstrual dysphoric disorder)   . Seasonal allergies   . Vaginal delivery    ONE NSVD  . 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 ANTERIOR CERVICAL DECOMP/DISCECTOMY FUSION  ~ 2003  . BACK SURGERY    . BREAST SURGERY     BREAST BIOPSY--RIGHT BENIGN  . BUNIONECTOMY Bilateral   . COSMETIC SURGERY  2016   "back of my neck; related to earlier fusion"  . CYSTOSCOPY W/ STONE MANIPULATION  "several times"  . DILATION AND CURETTAGE OF UTERUS    . FOREHEAD RECONSTRUCTION Right    "removed bone protruding out of my forehead"  . HARDWARE REMOVAL  2016   "related to neck OR"  . INCONTINENCE SURGERY    . POSTERIOR CERVICAL FUSION/FORAMINOTOMY  ~ 2008; 2015  . SHOULDER ARTHROSCOPY W/ ROTATOR CUFF  REPAIR Right 2012  . SPINAL FUSION  06/2014 - 2018 X ?7   "scoliosis; my entire back"  . TUBAL LIGATION    . VAGINAL HYSTERECTOMY     TV[facility]    Current Outpatient Medications  Medication Sig Dispense Refill  . acetaminophen (TYLENOL) 500 MG tablet Take 1,000 mg by mouth every 6 (six) hours as needed for mild pain.     . Calcium Carbonate Antacid (TUMS CHEWY BITES PO) Take 1 tablet by mouth daily as needed (reflux).     . Calcium Citrate-Vitamin D (CALCIUM + D PO) Take 1 tablet by mouth daily.    .  clonazePAM (KLONOPIN) 0.5 MG tablet Take 1 tablet (0.5 mg total) by mouth 2 (two) times daily as needed for anxiety. 60 tablet 1  . cyclobenzaprine (FLEXERIL) 10 MG tablet Take 10 mg by mouth 3 (three) times daily as needed for muscle spasms.   5  . diltiazem (CARDIZEM CD) 120 MG 24 hr capsule Take 120 mg by mouth every evening.     . dofetilide (TIKOSYN) 250 MCG capsule Take 1 capsule (250 mcg total) by mouth 2 (two) times daily. 180 capsule 3  . furosemide (LASIX) 20 MG tablet Take 1 tablet (20 mg total) by mouth daily. 90 tablet 3  . gabapentin (NEURONTIN) 100 MG capsule Take 100 mg by mouth at bedtime.    . Magnesium 200 MG TABS Take 1 tablet (200 mg total) by mouth daily. 30 each   . meclizine (ANTIVERT) 25 MG tablet Take 1 tablet (25 mg total) by mouth 3 (three) times daily as needed for dizziness. 45 tablet 0  . metoprolol tartrate (LOPRESSOR) 25 MG tablet Take 1 tablet (25 mg total) by mouth 2 (two) times daily. 60 tablet 11  . Nystatin POWD Apply small amount to affected area. 1 Bottle 3  . oxyCODONE (ROXICODONE) 5 MG/5ML solution Take 6.5 mg by mouth every 4 (four) hours as needed for moderate pain.     . polyethylene glycol (MIRALAX / GLYCOLAX) packet Take 17 g by mouth daily as needed for mild constipation.    . potassium chloride (K-DUR) 10 MEQ tablet Take 1 tablet (10 mEq total) by mouth daily. 30 tablet 4  . rivaroxaban (XARELTO) 20 MG TABS tablet Take 1 tablet (20 mg total) by mouth daily with supper. 90 tablet 2  . Teriparatide, Recombinant, (FORTEO) 600 MCG/2.4ML SOLN Inject 20 mcg into the skin at bedtime.     . valACYclovir (VALTREX) 1000 MG tablet TK 2 TS PO TONIGHT AND 2 TS TOMORROW MORNING UTD  0   No current facility-administered medications for this visit.     Allergies:   Contrast media [iodinated diagnostic agents]; Cymbalta [duloxetine hcl]; Erythromycin; Latex; Levofloxacin; Nsaids; Other; Oxycodone; Septra [bactrim]; Sulfa antibiotics; Adhesive [tape]; and Morphine    Social History:  The patient  reports that she has quit smoking. Her smoking use included cigarettes. She has a 1.40 pack-year smoking history. She has never used smokeless tobacco. She reports current drug use. Drug: Oxycodone. She reports that she does not drink alcohol.   Family History:  The patient's  family history includes Arthritis in her mother; Cancer in her mother; Diabetes in her brother, brother, and father; Heart Problems in her brother; Heart disease in her mother, sister, and sister; Hyperlipidemia in her sister and sister; Stroke in her sister.   ROS:  Please see the history of present illness.   All other systems are personally reviewed and negative.  Exam:    Vital Signs:  BP (!) 150/80   Well appearing, alert and conversant, regular work of breathing,  good skin color Eyes- anicteric, neuro- grossly intact, skin- no apparent rash or lesions or cyanosis, mouth- oral mucosa is pink   Labs/Other Tests and Data Reviewed:    Recent Labs: 07/26/2018: Magnesium 1.8 08/25/2018: BUN 16; Creatinine, Ser 0.59; Hemoglobin 13.2; Platelets 207.0; Potassium 4.0; Sodium 142; TSH 2.49   Wt Readings from Last 3 Encounters:  09/17/18 124 lb (56.2 kg)  08/25/18 122 lb (55.3 kg)  08/10/18 123 lb (55.8 kg)     Other studies personally reviewed: Additional studies/ records that were reviewed today include: TTE 09/16/2017  Review of the above records today demonstrates:   Ejection fraction 60% Moderate mitral regurgitation Mild to moderate tricuspid regurgitation Normal-sized left atrium  ECG 07/26/2018 personally reviewed Sinus rhythm   ASSESSMENT & PLAN:    1.  Paroxysmal atrial fibrillation: Currently on dofetilide and Xarelto.  She continues to have episodes of atrial fibrillation on dofetilide.  At this point she would prefer to have ablation performed.  Risks and benefits were discussed and include bleeding, tamponade, heart block, stroke, damage surrounding organs.  She  understands these risks and is agreed to the procedure.  This patients CHA2DS2-VASc Score and unadjusted Ischemic Stroke Rate (% per year) is equal to 3.2 % stroke rate/year from a score of 3  Above score calculated as 1 point each if present [CHF, HTN, DM, Vascular=MI/PAD/Aortic Plaque, Age if 65-74, or Female] Above score calculated as 2 points each if present [Age > 75, or Stroke/TIA/TE]  2.  Hypertension: Elevated today but is usually well controlled.  No changes.   COVID 19 screen The patient denies symptoms of COVID 19 at this time.  The importance of social distancing was discussed today.  Follow-up: 3 months   Current medicines are reviewed at length with the patient today.   The patient does not have concerns regarding her medicines.  The following changes were made today:  none  Labs/ tests ordered today include:  No orders of the defined types were placed in this encounter.  Case discussed with primary cardiology  Patient Risk:  after full review of this patients clinical status, I feel that they are at moderate risk at this time.  Today, I have spent 18 minutes with the patient with telehealth technology discussing atrial fibrillation.    Signed, Ghazi Rumpf Meredith Leeds, MD  10/28/2018 9:43 AM     [facility] [address] [place] [address] [phone] (office) [phone] (fax)

## 2018-10-28 NOTE — Telephone Encounter (Signed)
Discussed questions with pt. Alleviated any concerns. She wanted to make sure she could still have an ablation w/ hx MVP. Pt thanks me for the call back.

## 2018-10-28 NOTE — Telephone Encounter (Signed)
° °  Patient has questions regarding mitral valve prolapse diagnosis and ablation

## 2018-11-01 ENCOUNTER — Ambulatory Visit: Payer: Self-pay | Admitting: Family Medicine

## 2018-11-03 ENCOUNTER — Encounter: Payer: Medicare Other | Admitting: Obstetrics & Gynecology

## 2018-11-18 DIAGNOSIS — Z981 Arthrodesis status: Secondary | ICD-10-CM | POA: Diagnosis not present

## 2018-11-18 DIAGNOSIS — G894 Chronic pain syndrome: Secondary | ICD-10-CM | POA: Diagnosis not present

## 2018-11-19 ENCOUNTER — Telehealth: Payer: Self-pay | Admitting: *Deleted

## 2018-11-19 NOTE — Telephone Encounter (Signed)
-----   Message from Iona Hansen, [place] sent at 11/19/2018 10:12 AM EDT ----- Venetia Night would like a face to face appt with [name] to discuss ablation and what needs to be done before ablation.  Could you please call and schedule her?   Thank you  Estill Bamberg

## 2018-11-19 NOTE — Telephone Encounter (Signed)
lmtcb

## 2018-11-19 NOTE — Telephone Encounter (Signed)
Patient returned your call.

## 2018-11-19 NOTE — Telephone Encounter (Signed)
Spoke with pt and made her aware that Sherri, RN is off for the rest of the day.  Advised I will send message and have her follow up when she returns to office.  Pt appreciative for call.

## 2018-11-22 ENCOUNTER — Ambulatory Visit (HOSPITAL_COMMUNITY): Payer: Self-pay | Admitting: Nurse Practitioner

## 2018-11-22 NOTE — Telephone Encounter (Signed)
Follow Up:     Returning  Your call from Friday.

## 2018-11-23 ENCOUNTER — Telehealth: Payer: Self-pay | Admitting: Cardiology

## 2018-11-23 ENCOUNTER — Other Ambulatory Visit: Payer: Self-pay | Admitting: Internal Medicine

## 2018-11-23 MED ORDER — POTASSIUM CHLORIDE ER 10 MEQ PO TBCR: 10.0000 meq | EXTENDED_RELEASE_TABLET | Freq: Every day | ORAL | 3 refills | Status: DC

## 2018-11-23 NOTE — Telephone Encounter (Signed)
Encounter not needed

## 2018-11-23 NOTE — Telephone Encounter (Signed)
Follow up:   Patient returning a call back. She would like to speak with Sherri.

## 2018-11-23 NOTE — Telephone Encounter (Signed)
Pt's medication was sent to pt's pharmacy as requested. Confirmation received.  °

## 2018-11-23 NOTE — Telephone Encounter (Signed)
°*  STAT* If patient is at the pharmacy, call can be transferred to refill team.   1. Which medications need to be refilled? (please list name of each medication and dose if known) Potassium 2. Which pharmacy/location (including street and city if local pharmacy) is medication to be sent to? [facility] [address] [facility]  3. Do they need a 30 day or 90 day supply? patient Not sure

## 2018-11-25 NOTE — Telephone Encounter (Signed)
Pt would like face to face with [name] to discuss ablation further.  She doesn't want f/u to be virtual again. Pt scheduled for 7/6 in [facility] office.  She understands I have made notation that appt needs to be in-person in-office visit. Discussed if still having to change to virtual at that time we will make arrangement at [place]/[facility] office when in-person visit can be scheduled. Pt agreeable to plan.

## 2018-11-26 ENCOUNTER — Encounter: Payer: Self-pay | Admitting: Family Medicine

## 2018-11-26 ENCOUNTER — Other Ambulatory Visit: Payer: Self-pay

## 2018-11-26 ENCOUNTER — Ambulatory Visit (INDEPENDENT_AMBULATORY_CARE_PROVIDER_SITE_OTHER): Payer: Medicare Other | Admitting: Family Medicine

## 2018-11-26 VITALS — Ht 66.0 in | Wt 124.0 lb

## 2018-11-26 DIAGNOSIS — E785 Hyperlipidemia, unspecified: Secondary | ICD-10-CM | POA: Diagnosis not present

## 2018-11-26 DIAGNOSIS — F329 Major depressive disorder, single episode, unspecified: Secondary | ICD-10-CM | POA: Diagnosis not present

## 2018-11-26 DIAGNOSIS — R350 Frequency of micturition: Secondary | ICD-10-CM | POA: Diagnosis not present

## 2018-11-26 DIAGNOSIS — F419 Anxiety disorder, unspecified: Secondary | ICD-10-CM

## 2018-11-26 DIAGNOSIS — F32A Depression, unspecified: Secondary | ICD-10-CM

## 2018-11-26 NOTE — Progress Notes (Signed)
I have discussed the procedure for the virtual visit with the patient who has given consent to proceed with assessment and treatment.   Danaye Sobh, CMA     

## 2018-11-26 NOTE — Progress Notes (Signed)
Virtual Visit via Video   I connected with patient on 11/26/18 at 11:00 AM EDT by a video enabled telemedicine application and verified that I am speaking with the correct person using two identifiers.  Location patient: Home Location provider: [facility], Office Persons participating in the virtual visit: Patient, Provider, [place] Romelle Starcher D)  I discussed the limitations of evaluation and management by telemedicine and the availability of in person appointments. The patient expressed understanding and agreed to proceed.  Subjective:   HPI:   Hyperlipidemia- pt has hx of elevated cholesterol but this is mostly due to high HDL.  Pt is unable to exercise.    ? UTI- on Sunday pt reports having to get up multiple times which is unusual for her.  'a little burning', 'it was just different'.  Has not had sxs since Sunday night.  'it's like it went away'.  Has chronic constipation.  Anxiety- unchanged.  Pt has still not sought out counseling.    ROS:   See pertinent positives and negatives per HPI.  Patient Active Problem List   Diagnosis Date Noted  . Visit for monitoring Tikosyn therapy 03/29/2018  . Osteoporosis 09/23/2017  . Fatigue 02/18/2017  . Physical exam 04/21/2016  . Anxiety and depression 02/13/2016  . Chronic pain 02/13/2016  . Vertigo 11/16/2015  . B12 deficiency 10/01/2015  . Hearing loss due to cerumen impaction 08/23/2015  . Foot pain, right 05/23/2015  . Hyperlipidemia 04/19/2015  . Protein-calorie malnutrition ([place]) 10/19/2014  . Fracture of multiple ribs 08/07/2014  . Paroxysmal atrial fibrillation ([facility])   . Tachycardia   . Anemia 08/06/2014  . Constipation 07/14/2014  . Lumbar back pain 05/19/2012  . Seasonal allergic rhinitis 11/06/2011  . S/P cervical spinal fusion 11/06/2011  . GERD (gastroesophageal reflux disease) 11/06/2011    Social History   Tobacco Use  . Smoking status: Former Smoker    Packs/day: 0.10    Years: 14.00    Pack  years: 1.40    Types: Cigarettes  . Smokeless tobacco: Never Used  . Tobacco comment: 03/31/2018 "quit ~ 1980; someday smoker when I did smoke; never addicted"  Substance Use Topics  . Alcohol use: Never    Frequency: Never    Current Outpatient Medications:  .  acetaminophen (TYLENOL) 500 MG tablet, Take 1,000 mg by mouth every 6 (six) hours as needed for mild pain. , Disp: , Rfl:  .  Calcium Carbonate Antacid (TUMS CHEWY BITES PO), Take 1 tablet by mouth daily as needed (reflux). , Disp: , Rfl:  .  Calcium Citrate-Vitamin D (CALCIUM + D PO), Take 1 tablet by mouth daily., Disp: , Rfl:  .  clonazePAM (KLONOPIN) 0.5 MG tablet, Take 1 tablet (0.5 mg total) by mouth 2 (two) times daily as needed for anxiety., Disp: 60 tablet, Rfl: 1 .  cyclobenzaprine (FLEXERIL) 10 MG tablet, Take 10 mg by mouth 3 (three) times daily as needed for muscle spasms. , Disp: , Rfl: 5 .  diltiazem (CARDIZEM CD) 120 MG 24 hr capsule, Take 120 mg by mouth every evening. , Disp: , Rfl:  .  dofetilide (TIKOSYN) 250 MCG capsule, Take 1 capsule (250 mcg total) by mouth 2 (two) times daily., Disp: 180 capsule, Rfl: 3 .  furosemide (LASIX) 20 MG tablet, Take 1 tablet (20 mg total) by mouth daily., Disp: 90 tablet, Rfl: 3 .  gabapentin (NEURONTIN) 100 MG capsule, Take 100 mg by mouth at bedtime., Disp: , Rfl:  .  Magnesium 200 MG  TABS, Take 1 tablet (200 mg total) by mouth daily., Disp: 30 each, Rfl:  .  meclizine (ANTIVERT) 25 MG tablet, Take 1 tablet (25 mg total) by mouth 3 (three) times daily as needed for dizziness., Disp: 45 tablet, Rfl: 0 .  metoprolol tartrate (LOPRESSOR) 25 MG tablet, Take 1 tablet (25 mg total) by mouth 2 (two) times daily., Disp: 60 tablet, Rfl: 11 .  Nystatin POWD, Apply small amount to affected area., Disp: 1 Bottle, Rfl: 3 .  oxyCODONE (ROXICODONE) 5 MG/5ML solution, Take 6.5 mg by mouth every 4 (four) hours as needed for moderate pain. , Disp: , Rfl:  .  polyethylene glycol (MIRALAX / GLYCOLAX)  packet, Take 17 g by mouth daily as needed for mild constipation., Disp: , Rfl:  .  potassium chloride (K-DUR) 10 MEQ tablet, Take 1 tablet (10 mEq total) by mouth daily., Disp: 90 tablet, Rfl: 3 .  rivaroxaban (XARELTO) 20 MG TABS tablet, Take 1 tablet (20 mg total) by mouth daily with supper., Disp: 90 tablet, Rfl: 2 .  valACYclovir (VALTREX) 1000 MG tablet, TK 2 TS PO TONIGHT AND 2 TS TOMORROW MORNING UTD, Disp: , Rfl: 0 .  Teriparatide, Recombinant, (FORTEO) 600 MCG/2.4ML SOLN, Inject 20 mcg into the skin at bedtime. , Disp: , Rfl:   Allergies  Allergen Reactions  . Contrast Media [Iodinated Diagnostic Agents] Hives    unknown  . Cymbalta [Duloxetine Hcl]     Severe diarrhea and upset stomach  . Erythromycin     unknown  . Latex     hives  . Levofloxacin Nausea And Vomiting  . Nsaids     Other reaction(s): Other (See Comments) CANNOT TAKE PER CARDIOLOGIST DUE TO AFIB   . Other     CAN NOT TAKE , aLPRAZOLAM, OR ELAVIL DUE TO AFIB FOR ANXIETY  IV CONTRAST /"DYE".  . Oxycodone     Other reaction(s): Delusions (intolerance)  PILLS ONLY  . Septra [Bactrim]     Hives   . Sulfa Antibiotics     rash  . Adhesive [Tape] Rash and Other (See Comments)    Heart monitor stickers must be rotated in order to prevent rash  . Morphine Anxiety    Objective:   Ht 5\' 6"  (1.676 m)   Wt 124 lb (56.2 kg)   BMI 20.01 kg/m  Pt is able to speak clearly, coherently without shortness of breath or increased work of breathing.  Thought process is linear.  Mood is appropriate.   Assessment and Plan:   Anxiety- unchanged.  Pt has been intolerant to multiple SSRIs, Wellbutrin, Buspar in the past.  Continues to take Benzos PRN.  Has not started counseling as recommended.  Hyperlipidemia- chronic issue.  Pt not comfortable coming in for labs at this time.  Will check at future visits  Urinary frequency- currently asymptomatic.  Discussed the impact that constipation can have on bladder function.   Pt feels this may have been the case.  Encouraged her to monitor sxs and notify me if things change.   [name], MD 11/26/2018  Total time spent w/ pt 23 minutes

## 2018-12-01 ENCOUNTER — Ambulatory Visit: Payer: Medicare Other

## 2018-12-03 ENCOUNTER — Telehealth (HOSPITAL_COMMUNITY): Payer: Self-pay | Admitting: *Deleted

## 2018-12-03 NOTE — Telephone Encounter (Addendum)
Pt calls in with multiple complaints including chest pressure, upper back pain, indigestion and is concerned "it could be her heart." Pt has been having issues with severe constipation which is requiring magnesium citrate at times and wants to make sure her electrolytes are still acceptable for tikosyn. Worried she has not been assessed other than virtual visits for several months. Would like an EKG/labs to confirm some of her symptoms are not heart related. Appt made for Monday.

## 2018-12-06 ENCOUNTER — Encounter (HOSPITAL_COMMUNITY): Payer: Self-pay | Admitting: Physician Assistant

## 2018-12-06 ENCOUNTER — Other Ambulatory Visit: Payer: Self-pay

## 2018-12-06 ENCOUNTER — Ambulatory Visit (HOSPITAL_COMMUNITY)
Admission: RE | Admit: 2018-12-06 | Discharge: 2018-12-06 | Disposition: A | Discharge status: Home / Self Care | Payer: Medicare Other | Source: Ambulatory Visit | Attending: Physician Assistant | Admitting: Physician Assistant

## 2018-12-06 VITALS — BP 132/78 | HR 88 | Ht 66.0 in | Wt 128.6 lb

## 2018-12-06 DIAGNOSIS — Z8249 Family history of ischemic heart disease and other diseases of the circulatory system: Secondary | ICD-10-CM | POA: Diagnosis not present

## 2018-12-06 DIAGNOSIS — Z79899 Other long term (current) drug therapy: Secondary | ICD-10-CM | POA: Diagnosis not present

## 2018-12-06 DIAGNOSIS — Z886 Allergy status to analgesic agent status: Secondary | ICD-10-CM | POA: Insufficient documentation

## 2018-12-06 DIAGNOSIS — Z888 Allergy status to other drugs, medicaments and biological substances status: Secondary | ICD-10-CM | POA: Insufficient documentation

## 2018-12-06 DIAGNOSIS — F419 Anxiety disorder, unspecified: Secondary | ICD-10-CM | POA: Diagnosis not present

## 2018-12-06 DIAGNOSIS — Z87891 Personal history of nicotine dependence: Secondary | ICD-10-CM | POA: Diagnosis not present

## 2018-12-06 DIAGNOSIS — Z87442 Personal history of urinary calculi: Secondary | ICD-10-CM | POA: Diagnosis not present

## 2018-12-06 DIAGNOSIS — Z85820 Personal history of malignant melanoma of skin: Secondary | ICD-10-CM | POA: Insufficient documentation

## 2018-12-06 DIAGNOSIS — Z885 Allergy status to narcotic agent status: Secondary | ICD-10-CM | POA: Insufficient documentation

## 2018-12-06 DIAGNOSIS — Z7901 Long term (current) use of anticoagulants: Secondary | ICD-10-CM | POA: Insufficient documentation

## 2018-12-06 DIAGNOSIS — I480 Paroxysmal atrial fibrillation: Secondary | ICD-10-CM | POA: Insufficient documentation

## 2018-12-06 DIAGNOSIS — I10 Essential (primary) hypertension: Secondary | ICD-10-CM | POA: Insufficient documentation

## 2018-12-06 DIAGNOSIS — Z881 Allergy status to other antibiotic agents status: Secondary | ICD-10-CM | POA: Diagnosis not present

## 2018-12-06 DIAGNOSIS — Z882 Allergy status to sulfonamides status: Secondary | ICD-10-CM | POA: Insufficient documentation

## 2018-12-06 LAB — BASIC METABOLIC PANEL
Anion gap: 6 (ref 5–15)
BUN: 23 mg/dL (ref 8–23)
CO2: 28 mmol/L (ref 22–32)
Calcium: 9.3 mg/dL (ref 8.9–10.3)
Chloride: 107 mmol/L (ref 98–111)
Creatinine, Ser: 0.63 mg/dL (ref 0.44–1.00)
GFR calc Af Amer: >60 mL/min (ref >60)
GFR calc non Af Amer: >60 mL/min (ref >60)
Glucose, Bld: 95 mg/dL (ref 70–99)
Potassium: 4.1 mmol/L (ref 3.5–5.1)
Sodium: 141 mmol/L (ref 135–145)

## 2018-12-06 LAB — MAGNESIUM: Magnesium: 2 mg/dL (ref 1.7–2.4)

## 2018-12-06 NOTE — Progress Notes (Signed)
Primary Care Physician: [name], MD Primary Cardiologist: Wynonia Lawman Primary Electrophysiologist: Jordan Likes [name] is a 73 y.o. female with a history of paroxysmal atrial fibrillation who presents for follow up in the [facility]," I htought I was going to die last night." Pt has h/o anxiety/panic disorder and has become increasingly  anxious, having called our office 1-2x a day all last week for random issues she was worried about. Yesterday, she felt that there was something wrong with her heart or her lungs, " I thought I was going to die"  and could not sleep for the second night in a row. She called the MD on call and she was advised to take 1/2 tab of an old clonazepam she had on hand. She was able to go to sleep  then. Was recently started on Buspar, but does not feel that it is helping. States, " I need a pill or something to get out of this hole." She discussed with her PCP re seeing a counselor but has had some ill fitting counselors in the past and is leary about seeing a new one.  Since  being started on tikosyn, the pt is staying in [place] but is now wondering if [name] is contributing to her issues. She states that she has had chronic shortness of breath for years. She describes that it was hard to bring the air up from the base of her lungs. Very shaky and emotional  today with occasional tearing up.  F/u in afib clinic, 1/20,75 for Tikosyn surveillance. She is in a happier frame of mind today, feels better. Has not noted any afib. Taking Tikosyn consistently.   F/u AF clinic 12/06/18. Patient reports that she has done reasonably well since her last visit. She has only had one brief episode of afib. She does report that she has had some epigastric pain related to food and position and relieved with Tums. No exertional CP or SOB.     Today, she denies symptoms of palpitations, orthopnea, PND, lower extremity edema, dizziness, presyncope, syncope,  snoring, daytime somnolence, bleeding, or neurologic sequela. The patient is tolerating medications without difficulties and is otherwise without complaint today.   Atrial Fibrillation Risk Factors:  she does not have symptoms or diagnosis of sleep apnea. she does not have a history of rheumatic fever. she does not have a history of alcohol use.  she has a BMI of Body mass index is 20.76 kg/m.[name] Filed Weights   12/06/18 1503  Weight: 58.3 kg     Atrial Fibrillation Management history:  Previous antiarrhythmic drugs: Flecainide (did not tolerate); tikosyn started 03/2018 Previous cardioversions: none Previous ablations: none CHADS2VASC score: 3 Anticoagulation history: Xarelto   Past Medical History:  Diagnosis Date  . Anxiety   . Arthritis    "maybe in my back" (03/31/2018)  . Benign paroxysmal positional vertigo 06/08/2013  . Fracture of multiple ribs 2015   "don't know from what; dx'd when I in hospital for 1st back OR" (03/31/2018)  . GERD (gastroesophageal reflux disease)   . Hair loss 04/12/2012  . Herpes   . History of blood transfusion    "twice; related to back OR" (03/31/2018)  . History of kidney stones   . Interstitial cystitis 11/06/2011  . 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 Osteopenia 02/18/2012  . Osteoporosis   . PAF (paroxysmal atrial fibrillation) ([place]) 2012  . Peripheral neuropathy 11/06/2011  . PMDD (premenstrual  dysphoric disorder)   . Seasonal allergies   . Vaginal delivery    ONE NSVD  . 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 ANTERIOR CERVICAL DECOMP/DISCECTOMY FUSION  ~ 2003  . BACK SURGERY    . BREAST SURGERY     BREAST BIOPSY--RIGHT BENIGN  . BUNIONECTOMY Bilateral   . COSMETIC SURGERY  2016   "back of my neck; related to earlier fusion"  . CYSTOSCOPY W/ STONE MANIPULATION  "several times"  . DILATION AND CURETTAGE OF UTERUS    . FOREHEAD RECONSTRUCTION Right    "removed bone protruding out of  my forehead"  . HARDWARE REMOVAL  2016   "related to neck OR"  . INCONTINENCE SURGERY    . POSTERIOR CERVICAL FUSION/FORAMINOTOMY  ~ 2008; 2015  . SHOULDER ARTHROSCOPY W/ ROTATOR CUFF REPAIR Right 2012  . SPINAL FUSION  06/2014 - 2018 X ?7   "scoliosis; my entire back"  . TUBAL LIGATION    . VAGINAL HYSTERECTOMY     TV[facility]    Current Outpatient Medications  Medication Sig Dispense Refill  . acetaminophen (TYLENOL) 500 MG tablet Take 1,000 mg by mouth every 6 (six) hours as needed for mild pain.     . Calcium Carbonate Antacid (TUMS CHEWY BITES PO) Take 1 tablet by mouth daily as needed (reflux).     . Calcium Citrate-Vitamin D (CALCIUM + D PO) Take 1 tablet by mouth daily.    . clonazePAM (KLONOPIN) 0.5 MG tablet Take 1 tablet (0.5 mg total) by mouth 2 (two) times daily as needed for anxiety. 60 tablet 1  . cyclobenzaprine (FLEXERIL) 10 MG tablet Take 10 mg by mouth 3 (three) times daily as needed for muscle spasms.   5  . diltiazem (CARDIZEM CD) 120 MG 24 hr capsule Take 120 mg by mouth every evening.     . dofetilide (TIKOSYN) 250 MCG capsule Take 1 capsule (250 mcg total) by mouth 2 (two) times daily. 180 capsule 3  . furosemide (LASIX) 20 MG tablet Take 1 tablet (20 mg total) by mouth daily. 90 tablet 3  . gabapentin (NEURONTIN) 100 MG capsule Take 100 mg by mouth at bedtime.    . Magnesium 200 MG TABS Take 1 tablet (200 mg total) by mouth daily. 30 each   . meclizine (ANTIVERT) 25 MG tablet Take 1 tablet (25 mg total) by mouth 3 (three) times daily as needed for dizziness. 45 tablet 0  . metoprolol tartrate (LOPRESSOR) 25 MG tablet Take 1 tablet (25 mg total) by mouth 2 (two) times daily. 60 tablet 11  . Nystatin POWD Apply small amount to affected area. 1 Bottle 3  . oxyCODONE (ROXICODONE) 5 MG/5ML solution Take 6.5 mg by mouth every 4 (four) hours as needed for moderate pain.     . polyethylene glycol (MIRALAX / GLYCOLAX) packet Take 17 g by mouth daily as needed for mild  constipation.    . potassium chloride (K-DUR) 10 MEQ tablet Take 1 tablet (10 mEq total) by mouth daily. 90 tablet 3  . rivaroxaban (XARELTO) 20 MG TABS tablet Take 1 tablet (20 mg total) by mouth daily with supper. 90 tablet 2  . Teriparatide, Recombinant, (FORTEO) 600 MCG/2.4ML SOLN Inject 20 mcg into the skin at bedtime.     . valACYclovir (VALTREX) 1000 MG tablet TK 2 TS PO TONIGHT AND 2 TS TOMORROW MORNING UTD  0   No current facility-administered medications for this encounter.     Allergies  Allergen Reactions  . Contrast Media [Iodinated Diagnostic Agents] Hives    unknown  . Cymbalta [Duloxetine Hcl]     Severe diarrhea and upset stomach  . Erythromycin     unknown  . Latex     hives  . Levofloxacin Nausea And Vomiting  . Nsaids     Other reaction(s): Other (See Comments) CANNOT TAKE PER CARDIOLOGIST DUE TO AFIB   . Other     CAN NOT TAKE , aLPRAZOLAM, OR ELAVIL DUE TO AFIB FOR ANXIETY  IV CONTRAST /"DYE".  . Oxycodone     Other reaction(s): Delusions (intolerance)  PILLS ONLY  . Septra [Bactrim]     Hives   . Sulfa Antibiotics     rash  . Adhesive [Tape] Rash and Other (See Comments)    Heart monitor stickers must be rotated in order to prevent rash  . Morphine Anxiety    Social History   Socioeconomic History  . Marital status: Divorced    Spouse name: Not on file  . Number of children: Not on file  . Years of education: Not on file  . Highest education level: Not on file  Occupational History  . Not on file  Social Needs  . Financial resource strain: Not on file  . Food insecurity:    Worry: Not on file    Inability: Not on file  . Transportation needs:    Medical: Not on file    Non-medical: Not on file  Tobacco Use  . Smoking status: Former Smoker    Packs/day: 0.10    Years: 14.00    Pack years: 1.40    Types: Cigarettes  . Smokeless tobacco: Never Used  . Tobacco comment: 03/31/2018 "quit ~ 1980; someday smoker when I did smoke; never  addicted"  Substance and Sexual Activity  . Alcohol use: Never    Frequency: Never  . Drug use: Yes    Types: Oxycodone    Comment: 03/31/2018 "for chronic neck and back pain"  . Sexual activity: Not Currently  Lifestyle  . Physical activity:    Days per week: Not on file    Minutes per session: Not on file  . Stress: Not on file  Relationships  . Social connections:    Talks on phone: Not on file    Gets together: Not on file    Attends religious service: Not on file    Active member of club or organization: Not on file    Attends meetings of clubs or organizations: Not on file    Relationship status: Not on file  . Intimate partner violence:    Fear of current or ex partner: Not on file    Emotionally abused: Not on file    Physically abused: Not on file    Forced sexual activity: Not on file  Other Topics Concern  . Not on file  Social History Narrative  . Not on file    Family History  Problem Relation Age of Onset  . Diabetes Father   . Hyperlipidemia Sister   . Heart disease Sister   . Stroke Sister   . Diabetes Brother   . Hyperlipidemia Sister   . Heart disease Sister   . Arthritis Mother   . Cancer Mother        uterus  . Heart disease Mother   . Diabetes Brother   . Heart Problems Brother     ROS- All systems are reviewed and negative except as per the HPI  above.  Physical Exam: Vitals:   12/06/18 1503  BP: 132/78  Pulse: 88  Weight: 58.3 kg  Height: 5\' 6"  (1.676 m)   GEN- The patient is well appearing, alert and oriented x 3 today.   HEENT-head normocephalic, atraumatic, sclera clear, conjunctiva pink, hearing intact, trachea midline. Lungs- Clear to ausculation bilaterally, normal work of breathing Heart- Regular rate and rhythm, no murmurs, rubs or gallops  GI- soft, NT, ND, + BS Extremities- no clubbing, cyanosis, or edema MS- no significant deformity or atrophy Skin- no rash or lesion Psych- euthymic mood, full affect Neuro- strength  and sensation are intact   Wt Readings from Last 3 Encounters:  12/06/18 58.3 kg  11/26/18 56.2 kg  09/17/18 56.2 kg    EKG today demonstrates SR HR 88 PR 160, QRS 70, QTc 452  Epic records are reviewed at length today  Assessment and Plan:  1. Paroxysmal atrial fibrillation Doing well on Tikosyn, appears to be maintaining SR. QTc stable Continue dofetilide 250 mcg BID Continue Xarelto 20 mg daily. Bmet/mag today  This patients CHA2DS2-VASc Score and unadjusted Ischemic Stroke Rate (% per year) is equal to 2.2 % stroke rate/year from a score of 2  Above score calculated as 1 point each if present [CHF, HTN, DM, Vascular=MI/PAD/Aortic Plaque, Age if 65-74, or Female] Above score calculated as 2 points each if present [Age > 75, or Stroke/TIA/TE]   2. Epigastric/back discomfort Very atypical pain, unlikely cardiac. Patient reports she has a gastroenterologist who she will follow up with. ER precautions given.  3. HTN Stable, no changes today.   F/u with Dr Curt Bears as scheduled.   [facility] [address] [place], [place] [address] [phone]

## 2018-12-09 DIAGNOSIS — R42 Dizziness and giddiness: Secondary | ICD-10-CM | POA: Diagnosis not present

## 2018-12-09 DIAGNOSIS — G603 Idiopathic progressive neuropathy: Secondary | ICD-10-CM | POA: Diagnosis not present

## 2018-12-09 DIAGNOSIS — M5481 Occipital neuralgia: Secondary | ICD-10-CM | POA: Diagnosis not present

## 2018-12-09 DIAGNOSIS — M461 Sacroiliitis, not elsewhere classified: Secondary | ICD-10-CM | POA: Diagnosis not present

## 2018-12-13 ENCOUNTER — Other Ambulatory Visit: Payer: Self-pay

## 2018-12-13 ENCOUNTER — Ambulatory Visit (INDEPENDENT_AMBULATORY_CARE_PROVIDER_SITE_OTHER): Payer: Medicare Other | Admitting: Obstetrics & Gynecology

## 2018-12-13 ENCOUNTER — Encounter: Payer: Self-pay | Admitting: Obstetrics & Gynecology

## 2018-12-13 VITALS — BP 138/86 | Ht 64.5 in | Wt 124.0 lb

## 2018-12-13 DIAGNOSIS — Z90710 Acquired absence of both cervix and uterus: Secondary | ICD-10-CM

## 2018-12-13 DIAGNOSIS — Z01419 Encounter for gynecological examination (general) (routine) without abnormal findings: Secondary | ICD-10-CM | POA: Diagnosis not present

## 2018-12-13 DIAGNOSIS — Z780 Asymptomatic menopausal state: Secondary | ICD-10-CM

## 2018-12-13 DIAGNOSIS — M8589 Other specified disorders of bone density and structure, multiple sites: Secondary | ICD-10-CM

## 2018-12-13 NOTE — Patient Instructions (Signed)
1. Well female exam with routine gynecological exam Gynecologic exam status post total hysterectomy and menopause.  No indication for further Pap test.  Breast exam normal.  We will repeat a screening mammogram in August 2020.  No further colonoscopy.  Health labs with family physician.  Good body mass index at 20.96.  Continue with regular walking and healthy nutrition.  2. S/P total hysterectomy  3. Postmenopause Well on no hormone replacement therapy.  Still experiencing cyclic mood swings and breast tenderness with water retention monthly.  4. Osteopenia of multiple sites Continue with vitamin D supplements and calcium intake of 1200 mg daily.  Recommend walking regularly.  We will repeat a bone density in the near future at the bone clinic.  [name], it was a pleasure seeing you today!

## 2018-12-13 NOTE — Progress Notes (Signed)
[name] 1945/07/10 [id number]   History:    73 y.o. G1P1L1 Divorced  RP:  Established patient presenting for annual gyn exam   HPI: H/O Total Hysterectomy.  Menopause, well on no HRT.  No pelvic pain.  Abstinent.  Urine/BMs wnl.  Breasts normal.  BMI 20.96.  Walking regularly.  Fasting Health Labs with Fam MD.  Chronic pain, h/o cervical spine fusion.  Past medical history,surgical history, family history and social history were all reviewed and documented in the EPIC chart.  Gynecologic History No LMP recorded. Patient has had a hysterectomy. Contraception: status post hysterectomy Last Pap: 2012.  Results were: Negative Last mammogram:  02/2018. Results were: normal.  08/2018 Rt Dx mammo/US negative Bone Density: 05/2016 Osteopenia T-Score -2.3.  Rx with [facility] x 2 yrs.  Will repeat a BD now at [facility]. Colonoscopy: 2010 Dr Earlean Shawl.  No further Colono.  Obstetric History OB History  Gravida Para Term Preterm AB Living  1 1 1     1   SAB TAB Ectopic Multiple Live Births          1    # Outcome Date GA Lbr Len/2nd Weight Sex Delivery Anes PTL Lv  1 Term     M Vag-Spont  N LIV     ROS: A ROS was performed and pertinent positives and negatives are included in the history.  GENERAL: No fevers or chills. HEENT: No change in vision, no earache, sore throat or sinus congestion. NECK: No pain or stiffness. CARDIOVASCULAR: No chest pain or pressure. No palpitations. PULMONARY: No shortness of breath, cough or wheeze. GASTROINTESTINAL: No abdominal pain, nausea, vomiting or diarrhea, melena or bright red blood per rectum. GENITOURINARY: No urinary frequency, urgency, hesitancy or dysuria. MUSCULOSKELETAL: No joint or muscle pain, no back pain, no recent trauma. DERMATOLOGIC: No rash, no itching, no lesions. ENDOCRINE: No polyuria, polydipsia, no heat or cold intolerance. No recent change in weight. HEMATOLOGICAL: No anemia or easy bruising or bleeding. NEUROLOGIC: No headache,  seizures, numbness, tingling or weakness. PSYCHIATRIC: No depression, no loss of interest in normal activity or change in sleep pattern.     Exam:   BP 138/86   Ht 5' 4.5" (1.638 m)   Wt 124 lb (56.2 kg)   BMI 20.96 kg/m   Body mass index is 20.96 kg/m.  General appearance : Well developed well nourished female. No acute distress HEENT: Eyes: no retinal hemorrhage or exudates,  Neck supple, trachea midline, no carotid bruits, no thyroidmegaly Lungs: Clear to auscultation, no rhonchi or wheezes, or rib retractions  Heart: Regular rate and rhythm, no murmurs or gallops Breast:Examined in sitting and supine position were symmetrical in appearance, no palpable masses or tenderness,  no skin retraction, no nipple inversion, no nipple discharge, no skin discoloration, no axillary or supraclavicular lymphadenopathy Abdomen: no palpable masses or tenderness, no rebound or guarding Extremities: no edema or skin discoloration or tenderness  Pelvic: Vulva: Normal             Vagina: No gross lesions or discharge  Cervix/Uteurs absent  Adnexa  Without masses or tenderness  Anus: Normal   Assessment/Plan:  73 y.o. female for annual exam   1. Well female exam with routine gynecological exam Gynecologic exam status post total hysterectomy and menopause.  No indication for further Pap test.  Breast exam normal.  We will repeat a screening mammogram in August 2020.  No further colonoscopy.  Health labs with family physician.  Good body  mass index at 20.96.  Continue with regular walking and healthy nutrition.  2. S/P total hysterectomy  3. Postmenopause Well on no hormone replacement therapy.  Still experiencing cyclic mood swings and breast tenderness with water retention monthly.  4. Osteopenia of multiple sites Continue with vitamin D supplements and calcium intake of 1200 mg daily.  Recommend walking regularly.  We will repeat a bone density in the near future at the bone clinic.   Princess Bruins MD, 10:22 AM 12/13/2018

## 2018-12-14 ENCOUNTER — Other Ambulatory Visit: Payer: Self-pay | Admitting: *Deleted

## 2018-12-14 DIAGNOSIS — M8589 Other specified disorders of bone density and structure, multiple sites: Secondary | ICD-10-CM

## 2018-12-20 ENCOUNTER — Other Ambulatory Visit: Payer: Self-pay

## 2018-12-21 ENCOUNTER — Other Ambulatory Visit: Payer: Self-pay | Admitting: Obstetrics & Gynecology

## 2018-12-21 ENCOUNTER — Ambulatory Visit (INDEPENDENT_AMBULATORY_CARE_PROVIDER_SITE_OTHER): Payer: Medicare Other

## 2018-12-21 DIAGNOSIS — Z780 Asymptomatic menopausal state: Secondary | ICD-10-CM

## 2018-12-21 DIAGNOSIS — M8589 Other specified disorders of bone density and structure, multiple sites: Secondary | ICD-10-CM

## 2018-12-23 DIAGNOSIS — Z981 Arthrodesis status: Secondary | ICD-10-CM | POA: Diagnosis not present

## 2018-12-23 DIAGNOSIS — Z9889 Other specified postprocedural states: Secondary | ICD-10-CM | POA: Diagnosis not present

## 2018-12-23 DIAGNOSIS — M79604 Pain in right leg: Secondary | ICD-10-CM | POA: Diagnosis not present

## 2018-12-23 DIAGNOSIS — Z8739 Personal history of other diseases of the musculoskeletal system and connective tissue: Secondary | ICD-10-CM | POA: Diagnosis not present

## 2018-12-23 DIAGNOSIS — M542 Cervicalgia: Secondary | ICD-10-CM | POA: Diagnosis not present

## 2018-12-23 DIAGNOSIS — M79605 Pain in left leg: Secondary | ICD-10-CM | POA: Diagnosis not present

## 2018-12-23 DIAGNOSIS — Z4789 Encounter for other orthopedic aftercare: Secondary | ICD-10-CM | POA: Diagnosis not present

## 2018-12-23 DIAGNOSIS — M898X1 Other specified disorders of bone, shoulder: Secondary | ICD-10-CM | POA: Diagnosis not present

## 2018-12-30 ENCOUNTER — Telehealth: Payer: Self-pay | Admitting: Cardiology

## 2018-12-30 MED ORDER — METOPROLOL TARTRATE 25 MG PO TABS: 25.0000 mg | ORAL_TABLET | Freq: Two times a day (BID) | ORAL | 1 refills | Status: DC

## 2018-12-30 NOTE — Telephone Encounter (Signed)
Refill for metoprolol tartrate sent to the [facility] in [place] as requested. Patient will need to be scheduled for a follow up appointment before further refills will be provided.

## 2018-12-30 NOTE — Telephone Encounter (Signed)
°*  STAT* If patient is at the pharmacy, call can be transferred to refill team.   1. Which medications need to be refilled? (please list name of each medication and dose if known) Metoprolol tartrate 25mg  twice daily  2. Which pharmacy/location (including street and city if local pharmacy) is medication to be sent to? [place] [address] [place]  3. Do they need a 30 day or 90 day supply? [place]

## 2019-01-03 ENCOUNTER — Telehealth: Payer: Self-pay | Admitting: *Deleted

## 2019-01-03 DIAGNOSIS — G629 Polyneuropathy, unspecified: Secondary | ICD-10-CM

## 2019-01-03 NOTE — Telephone Encounter (Signed)
Referral placed.

## 2019-01-03 NOTE — Addendum Note (Signed)
Addended by: Davis Gourd on: 01/03/2019 01:53 PM   Modules accepted: Orders

## 2019-01-03 NOTE — Telephone Encounter (Signed)
Ok for new referral?

## 2019-01-03 NOTE — Telephone Encounter (Signed)
Ok for referral?

## 2019-01-03 NOTE — Telephone Encounter (Signed)
Patient called in and said that she is still having issues with Neuropathy. She is not sure if she is talked to Dr. [name] about it.  She has a Garment/textile technologist but wants to change and see someone closer to home.  The new office is wanting a referral.   Would like  Referral sent to:  [facility] in [place].  Fax# [phone]

## 2019-01-04 DIAGNOSIS — K219 Gastro-esophageal reflux disease without esophagitis: Secondary | ICD-10-CM | POA: Diagnosis not present

## 2019-01-04 DIAGNOSIS — Z882 Allergy status to sulfonamides status: Secondary | ICD-10-CM | POA: Diagnosis not present

## 2019-01-04 DIAGNOSIS — I4891 Unspecified atrial fibrillation: Secondary | ICD-10-CM | POA: Diagnosis not present

## 2019-01-04 DIAGNOSIS — G629 Polyneuropathy, unspecified: Secondary | ICD-10-CM | POA: Diagnosis not present

## 2019-01-04 DIAGNOSIS — M79605 Pain in left leg: Secondary | ICD-10-CM | POA: Diagnosis not present

## 2019-01-04 DIAGNOSIS — Z91041 Radiographic dye allergy status: Secondary | ICD-10-CM | POA: Diagnosis not present

## 2019-01-04 DIAGNOSIS — Z91040 Latex allergy status: Secondary | ICD-10-CM | POA: Diagnosis not present

## 2019-01-04 DIAGNOSIS — Z794 Long term (current) use of insulin: Secondary | ICD-10-CM | POA: Diagnosis not present

## 2019-01-04 DIAGNOSIS — M79604 Pain in right leg: Secondary | ICD-10-CM | POA: Diagnosis not present

## 2019-01-04 DIAGNOSIS — Z79899 Other long term (current) drug therapy: Secondary | ICD-10-CM | POA: Diagnosis not present

## 2019-01-04 DIAGNOSIS — Z888 Allergy status to other drugs, medicaments and biological substances status: Secondary | ICD-10-CM | POA: Diagnosis not present

## 2019-01-04 DIAGNOSIS — Z87891 Personal history of nicotine dependence: Secondary | ICD-10-CM | POA: Diagnosis not present

## 2019-01-04 MED ORDER — SODIUM CHLORIDE 0.9 % IV SOLN
10.00 | INTRAVENOUS | Status: DC
Start: ? — End: 2019-01-04

## 2019-01-04 MED ORDER — GENERIC EXTERNAL MEDICATION
Status: DC
Start: ? — End: 2019-01-04

## 2019-01-06 ENCOUNTER — Other Ambulatory Visit: Payer: Self-pay | Admitting: Cardiology

## 2019-01-06 MED ORDER — DILTIAZEM HCL ER COATED BEADS 120 MG PO CP24: 120.0000 mg | ORAL_CAPSULE | Freq: Every evening | ORAL | 0 refills | Status: DC

## 2019-01-06 MED ORDER — METOPROLOL TARTRATE 25 MG PO TABS: 25.0000 mg | ORAL_TABLET | Freq: Two times a day (BID) | ORAL | 0 refills | Status: DC

## 2019-01-06 NOTE — Telephone Encounter (Addendum)
°*  STAT* If patient is at the pharmacy, call can be transferred to refill team.   1. Which medications need to be refilled? (please list name of each medication and dose if known)  diltiazem (CARDIZEM CD) 120 MG 24 hr capsule metoprolol tartrate (LOPRESSOR) 25 MG tablet     2. Which pharmacy/location (including street and city if local pharmacy) is medication to be sent to?  [facility] [address] [place] - [place] [phone] (Phone) [phone] (Fax)    3. Do they need a 30 day or 90 day supply? 90 day

## 2019-01-06 NOTE — Telephone Encounter (Signed)
Diltiazem and Metoprolol 2 week refill sent to [facility] in [place]. No additional refills will be provided until seen by MD.

## 2019-01-10 ENCOUNTER — Encounter: Payer: Self-pay | Admitting: *Deleted

## 2019-01-10 ENCOUNTER — Ambulatory Visit: Payer: Medicare Other | Admitting: Cardiology

## 2019-01-10 ENCOUNTER — Telehealth: Payer: Self-pay | Admitting: Cardiology

## 2019-01-10 NOTE — Telephone Encounter (Signed)

## 2019-01-11 ENCOUNTER — Other Ambulatory Visit: Payer: Self-pay

## 2019-01-11 ENCOUNTER — Encounter: Payer: Self-pay | Admitting: Cardiology

## 2019-01-11 ENCOUNTER — Ambulatory Visit (INDEPENDENT_AMBULATORY_CARE_PROVIDER_SITE_OTHER): Payer: Medicare Other | Admitting: Cardiology

## 2019-01-11 VITALS — BP 120/66 | HR 83 | Ht 64.5 in | Wt 123.0 lb

## 2019-01-11 DIAGNOSIS — I480 Paroxysmal atrial fibrillation: Secondary | ICD-10-CM

## 2019-01-11 MED ORDER — METOPROLOL TARTRATE 25 MG PO TABS: 25.0000 mg | ORAL_TABLET | Freq: Two times a day (BID) | ORAL | 1 refills | Status: DC

## 2019-01-11 MED ORDER — RIVAROXABAN 20 MG PO TABS: 20.0000 mg | ORAL_TABLET | Freq: Every day | ORAL | 3 refills | Status: DC

## 2019-01-11 NOTE — Patient Instructions (Addendum)
Medication Instructions:  Your physician recommends that you continue on your current medications as directed. Please refer to the Current Medication list given to you today.  * If you need a refill on your cardiac medications before your next appointment, please call your pharmacy.   Labwork: None ordered  Testing/Procedures: None ordered  Follow-Up: Your physician recommends that you schedule a follow-up appointment in: 3 months with [name].  Thank you for choosing [facility]!!   Trinidad Curet, RN [phone]  Any Other Special Instructions Will Be Listed Below (If Applicable).   Cardiac Ablation Cardiac ablation is a procedure to disable (ablate) a small amount of heart tissue in very specific places. The heart has many electrical connections. Sometimes these connections are abnormal and can cause the heart to beat very fast or irregularly. Ablating some of the problem areas can improve the heart rhythm or return it to normal. Ablation may be done for people who:  Have Wolff-Parkinson-White syndrome.  Have fast heart rhythms (tachycardia).  Have taken medicines for an abnormal heart rhythm (arrhythmia) that were not effective or caused side effects.  Have a high-risk heartbeat that may be life-threatening. During the procedure, a small incision is made in the neck or the groin, and a long, thin, flexible tube (catheter) is inserted into the incision and moved to the heart. Small devices (electrodes) on the tip of the catheter will send out electrical currents. A type of X-ray (fluoroscopy) will be used to help guide the catheter and to provide images of the heart. Tell a health care provider about:  Any allergies you have.  All medicines you are taking, including vitamins, herbs, eye drops, creams, and over-the-counter medicines.  Any problems you or family members have had with anesthetic medicines.  Any blood disorders you have.  Any surgeries you have  had.  Any medical conditions you have, such as kidney failure.  Whether you are pregnant or may be pregnant. What are the risks? Generally, this is a safe procedure. However, problems may occur, including:  Infection.  Bruising and bleeding at the catheter insertion site.  Bleeding into the chest, especially into the sac that surrounds the heart. This is a serious complication.  Stroke or blood clots.  Damage to other structures or organs.  Allergic reaction to medicines or dyes.  Need for a permanent pacemaker if the normal electrical system is damaged. A pacemaker is a small computer that sends electrical signals to the heart and helps your heart beat normally.  The procedure not being fully effective. This may not be recognized until months later. Repeat ablation procedures are sometimes required. What happens before the procedure?  Follow instructions from your health care provider about eating or drinking restrictions.  Ask your health care provider about: ? Changing or stopping your regular medicines. This is especially important if you are taking diabetes medicines or blood thinners. ? Taking medicines such as aspirin and ibuprofen. These medicines can thin your blood. Do not take these medicines before your procedure if your health care provider instructs you not to.  Plan to have someone take you home from the hospital or clinic.  If you will be going home right after the procedure, plan to have someone with you for 24 hours. What happens during the procedure?  To lower your risk of infection: ? Your health care team will wash or sanitize their hands. ? Your skin will be washed with soap. ? Hair may be removed from the incision area.  An IV tube will be inserted into one of your veins.  You will be given a medicine to help you relax (sedative).  The skin on your neck or groin will be numbed.  An incision will be made in your neck or your groin.  A needle will  be inserted through the incision and into a large vein in your neck or groin.  A catheter will be inserted into the needle and moved to your heart.  Dye may be injected through the catheter to help your surgeon see the area of the heart that needs treatment.  Electrical currents will be sent from the catheter to ablate heart tissue in desired areas. There are three types of energy that may be used to ablate heart tissue: ? Heat (radiofrequency energy). ? Laser energy. ? Extreme cold (cryoablation).  When the necessary tissue has been ablated, the catheter will be removed.  Pressure will be held on the catheter insertion area to prevent excessive bleeding.  A bandage (dressing) will be placed over the catheter insertion area. The procedure may vary among health care providers and hospitals. What happens after the procedure?  Your blood pressure, heart rate, breathing rate, and blood oxygen level will be monitored until the medicines you were given have worn off.  Your catheter insertion area will be monitored for bleeding. You will need to lie still for a few hours to ensure that you do not bleed from the catheter insertion area.  Do not drive for 24 hours or as long as directed by your health care provider. Summary  Cardiac ablation is a procedure to disable (ablate) a small amount of heart tissue in very specific places. Ablating some of the problem areas can improve the heart rhythm or return it to normal.  During the procedure, electrical currents will be sent from the catheter to ablate heart tissue in desired areas. This information is not intended to replace advice given to you by your health care provider. Make sure you discuss any questions you have with your health care provider. Document Released: 11/09/2008 Document Revised: 12/14/2017 Document Reviewed: 05/12/2016 Elsevier Patient Education  2020 [employer].

## 2019-01-11 NOTE — Progress Notes (Signed)
Electrophysiology Office Note   Date:  01/11/2019   ID:  Regina, [name] 23-May-1946, MRN [id number]  PCP:  [name], MD  Cardiologist:  Lovena Le Primary Electrophysiologist: Gaye Alken, MD    No chief complaint on file.    History of Present Illness: [name] is a 73 y.o. female who is being seen today for the evaluation of atrial fibrillation at the request of Tabori, [name], MD. Presenting today for electrophysiology evaluation.  She has a history of anxiety and panic disorder as well as atrial fibrillation.  She is currently on dofetilide.  She does continue though to have brief episodes of atrial fibrillation.  Today, she denies symptoms of palpitations, chest pain, shortness of breath, orthopnea, PND, lower extremity edema, claudication, dizziness, presyncope, syncope, bleeding, or neurologic sequela. The patient is tolerating medications without difficulties.  She continues to have episodes of atrial fibrillation.  Up into the last 2 weeks, she had been having multiple episodes a week that have been lasting a few hours at a time.  She feels that stress and anxiety is a trigger for her atrial fibrillation.  Over the last 2 weeks, she is only had one episode.  She is currently feeling well today.   Past Medical History:  Diagnosis Date  . Anxiety   . Arthritis    "maybe in my back" (03/31/2018)  . Benign paroxysmal positional vertigo 06/08/2013  . Fracture of multiple ribs 2015   "don't know from what; dx'd when I in hospital for 1st back OR" (03/31/2018)  . GERD (gastroesophageal reflux disease)   . Hair loss 04/12/2012  . Herpes   . History of blood transfusion    "twice; related to back OR" (03/31/2018)  . History of kidney stones   . Interstitial cystitis 11/06/2011  . 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 Osteopenia 02/18/2012  . Osteoporosis   . PAF (paroxysmal atrial fibrillation) ([place]) 2012  . Peripheral neuropathy 11/06/2011  .  PMDD (premenstrual dysphoric disorder)   . Seasonal allergies   . Vaginal delivery    ONE NSVD  . 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 ANTERIOR CERVICAL DECOMP/DISCECTOMY FUSION  ~ 2003  . BACK SURGERY    . BREAST SURGERY     BREAST BIOPSY--RIGHT BENIGN  . BUNIONECTOMY Bilateral   . COSMETIC SURGERY  2016   "back of my neck; related to earlier fusion"  . CYSTOSCOPY W/ STONE MANIPULATION  "several times"  . DILATION AND CURETTAGE OF UTERUS    . FOREHEAD RECONSTRUCTION Right    "removed bone protruding out of my forehead"  . HARDWARE REMOVAL  2016   "related to neck OR"  . INCONTINENCE SURGERY    . POSTERIOR CERVICAL FUSION/FORAMINOTOMY  ~ 2008; 2015  . SHOULDER ARTHROSCOPY W/ ROTATOR CUFF REPAIR Right 2012  . SPINAL FUSION  06/2014 - 2018 X ?7   "scoliosis; my entire back"  . TUBAL LIGATION    . VAGINAL HYSTERECTOMY     TV[facility]     Current Outpatient Medications  Medication Sig Dispense Refill  . acetaminophen (TYLENOL) 500 MG tablet Take 1,000 mg by mouth every 6 (six) hours as needed for mild pain.     . Calcium Carbonate Antacid (TUMS CHEWY BITES PO) Take 1 tablet by mouth daily as needed (reflux).     . Calcium Citrate-Vitamin D (CALCIUM + D PO) Take 1 tablet by mouth daily.    [name]  clonazePAM (KLONOPIN) 0.5 MG tablet Take 1 tablet (0.5 mg total) by mouth 2 (two) times daily as needed for anxiety. 60 tablet 1  . cyclobenzaprine (FLEXERIL) 10 MG tablet Take 10 mg by mouth 3 (three) times daily as needed for muscle spasms.   5  . diltiazem (CARDIZEM CD) 120 MG 24 hr capsule Take 1 capsule (120 mg total) by mouth every evening. **NO FURTHER REFILLS UNTIL SEEN BY MD** 15 capsule 0  . dofetilide (TIKOSYN) 250 MCG capsule Take 1 capsule (250 mcg total) by mouth 2 (two) times daily. 180 capsule 3  . furosemide (LASIX) 20 MG tablet Take 1 tablet (20 mg total) by mouth daily. 90 tablet 3  . gabapentin (NEURONTIN) 100 MG capsule Take 100 mg by mouth  at bedtime.    . Magnesium 200 MG TABS Take 1 tablet (200 mg total) by mouth daily. 30 each   . meclizine (ANTIVERT) 25 MG tablet Take 1 tablet (25 mg total) by mouth 3 (three) times daily as needed for dizziness. 45 tablet 0  . metoprolol tartrate (LOPRESSOR) 25 MG tablet Take 1 tablet (25 mg total) by mouth 2 (two) times daily. **NO REFILLS UNTIL SEEN BY MD"" 30 tablet 0  . Nystatin POWD Apply small amount to affected area. 1 Bottle 3  . oxyCODONE (ROXICODONE) 5 MG/5ML solution Take 6.5 mg by mouth every 4 (four) hours as needed for moderate pain.     . polyethylene glycol (MIRALAX / GLYCOLAX) packet Take 17 g by mouth daily as needed for mild constipation.    . potassium chloride (K-DUR) 10 MEQ tablet Take 1 tablet (10 mEq total) by mouth daily. 90 tablet 3  . rivaroxaban (XARELTO) 20 MG TABS tablet Take 1 tablet (20 mg total) by mouth daily with supper. 90 tablet 2  . valACYclovir (VALTREX) 1000 MG tablet TK 2 TS PO TONIGHT AND 2 TS TOMORROW MORNING UTD  0   No current facility-administered medications for this visit.     Allergies:   Buprenorphine, Contrast media [iodinated diagnostic agents], Cymbalta [duloxetine hcl], Erythromycin, Hydrocodone, Hydromorphone, Latex, Levofloxacin, Nsaids, Nucynta  [tapentadol hcl], Other, Oxycodone, Pentazocine, Septra [bactrim], Sulfa antibiotics, Adhesive [tape], and Morphine   Social History:  The patient  reports that she has quit smoking. Her smoking use included cigarettes. She has a 1.40 pack-year smoking history. She has never used smokeless tobacco. She reports current drug use. Drug: Oxycodone. She reports that she does not drink alcohol.   Family History:  The patient's family history includes Arthritis in her mother; Diabetes in her brother, brother, and father; Heart Problems in her brother; Heart disease in her mother, sister, and sister; Hyperlipidemia in her sister and sister; Stroke in her sister; Uterine cancer in her mother.    ROS:   Please see the history of present illness.   Otherwise, review of systems is positive for none.   All other systems are reviewed and negative.    PHYSICAL EXAM: VS:  BP 120/66   Pulse 83   Ht 5' 4.5" (1.638 m)   Wt 123 lb (55.8 kg)   BMI 20.79 kg/m  , BMI Body mass index is 20.79 kg/m. GEN: Well nourished, well developed, in no acute distress  HEENT: normal  Neck: no JVD, carotid bruits, or masses Cardiac: RRR; no murmurs, rubs, or gallops,no edema  Respiratory:  clear to auscultation bilaterally, normal work of breathing GI: soft, nontender, nondistended, + BS MS: no deformity or atrophy  Skin: warm and dry Neuro:  Strength and sensation are intact Psych: euthymic mood, full affect  EKG:  EKG is ordered today. Personal review of the ekg ordered shows sinus rhythm  Recent Labs: 08/25/2018: Hemoglobin 13.2; Platelets 207.0; TSH 2.49 12/06/2018: BUN 23; Creatinine, Ser 0.63; Magnesium 2.0; Potassium 4.1; Sodium 141    Lipid Panel     Component Value Date/Time   CHOL 237 (H) 09/23/2017 1420   TRIG 101.0 09/23/2017 1420   HDL 100.90 09/23/2017 1420   CHOLHDL 2 09/23/2017 1420   VLDL 20.2 09/23/2017 1420   LDLCALC 116 (H) 09/23/2017 1420   LDLDIRECT 136.5 06/08/2013 1049     Wt Readings from Last 3 Encounters:  01/11/19 123 lb (55.8 kg)  12/13/18 124 lb (56.2 kg)  12/06/18 128 lb 9.6 oz (58.3 kg)      Other studies Reviewed: Additional studies/ records that were reviewed today include: TTE 09/14/2017 Review of the above records today demonstrates: Ejection fraction 95% Grade 2 diastolic dysfunction Mild to moderate tricuspid regurgitation Normal-sized left atrium   ASSESSMENT AND PLAN:  1.  Paroxysmal atrial fibrillation: Currently on dofetilide and Xarelto.  He is been feeling well over the past few weeks, though she has had atrial fibrillation despite dofetilide.  Since she has been feeling well, she is not quite ready for ablation at this time.  I Jayli Fogleman see her  back in 3 months for further discussions.  This patients CHA2DS2-VASc Score and unadjusted Ischemic Stroke Rate (% per year) is equal to 2.2 % stroke rate/year from a score of 2  Above score calculated as 1 point each if present [CHF, HTN, DM, Vascular=MI/PAD/Aortic Plaque, Age if 65-74, or Female] Above score calculated as 2 points each if present [Age > 75, or Stroke/TIA/TE]    2.  Hypertension: Currently well controlled    Current medicines are reviewed at length with the patient today.   The patient does not have concerns regarding her medicines.  The following changes were made today:  none  Labs/ tests ordered today include:  Orders Placed This Encounter  Procedures  . EKG 12-Lead     Disposition:   FU with Allean Montfort 3 months  Signed, Kaylee Wombles Meredith Leeds, MD  01/11/2019 2:00 PM     [address] [place] [id number] [phone] (office) [phone] (fax)

## 2019-01-13 DIAGNOSIS — L570 Actinic keratosis: Secondary | ICD-10-CM | POA: Diagnosis not present

## 2019-01-13 DIAGNOSIS — L821 Other seborrheic keratosis: Secondary | ICD-10-CM | POA: Diagnosis not present

## 2019-01-13 DIAGNOSIS — B078 Other viral warts: Secondary | ICD-10-CM | POA: Diagnosis not present

## 2019-01-13 DIAGNOSIS — Z85820 Personal history of malignant melanoma of skin: Secondary | ICD-10-CM | POA: Diagnosis not present

## 2019-01-13 DIAGNOSIS — L820 Inflamed seborrheic keratosis: Secondary | ICD-10-CM | POA: Diagnosis not present

## 2019-01-13 DIAGNOSIS — D485 Neoplasm of uncertain behavior of skin: Secondary | ICD-10-CM | POA: Diagnosis not present

## 2019-01-17 DIAGNOSIS — F4323 Adjustment disorder with mixed anxiety and depressed mood: Secondary | ICD-10-CM | POA: Diagnosis not present

## 2019-01-18 ENCOUNTER — Telehealth: Payer: Self-pay | Admitting: *Deleted

## 2019-01-18 NOTE — Telephone Encounter (Signed)
Please schedule a Televisit.  It will be more complete and interactive.

## 2019-01-18 NOTE — Telephone Encounter (Signed)
Patient was informed with Dexa results noted below:  Bone Density Osteoporosis with lowest T-Score at the Left UD Forearm at -2.9 (Left 1/3 forearm -2.6). Recommend medical treatment. Continue Vitamin D supplements, Ca++ total intake of 1200 mg daily and regular weightbearing physical activities. Repeat Bone Density in 2 years. Please fax the report to Catalina Pizza, PA at [facility]. Fax [phone], Phone #[phone].    She would has couple of questions:  1. Is the Osteoporosis only in her forearm? 2 any improved from previous dexa? 3. What medications would you recommend?

## 2019-01-19 NOTE — Telephone Encounter (Signed)
Left detailed message on phone.

## 2019-01-20 DIAGNOSIS — M5417 Radiculopathy, lumbosacral region: Secondary | ICD-10-CM | POA: Diagnosis not present

## 2019-01-20 DIAGNOSIS — M1991 Primary osteoarthritis, unspecified site: Secondary | ICD-10-CM | POA: Diagnosis not present

## 2019-01-20 DIAGNOSIS — G603 Idiopathic progressive neuropathy: Secondary | ICD-10-CM | POA: Diagnosis not present

## 2019-01-20 DIAGNOSIS — M5412 Radiculopathy, cervical region: Secondary | ICD-10-CM | POA: Diagnosis not present

## 2019-01-20 DIAGNOSIS — G5601 Carpal tunnel syndrome, right upper limb: Secondary | ICD-10-CM | POA: Diagnosis not present

## 2019-01-20 DIAGNOSIS — R260 Ataxic gait: Secondary | ICD-10-CM | POA: Diagnosis not present

## 2019-01-20 DIAGNOSIS — R202 Paresthesia of skin: Secondary | ICD-10-CM | POA: Diagnosis not present

## 2019-01-24 ENCOUNTER — Telehealth (HOSPITAL_COMMUNITY): Payer: Self-pay | Admitting: *Deleted

## 2019-01-24 DIAGNOSIS — M961 Postlaminectomy syndrome, not elsewhere classified: Secondary | ICD-10-CM | POA: Diagnosis not present

## 2019-01-24 DIAGNOSIS — M4693 Unspecified inflammatory spondylopathy, cervicothoracic region: Secondary | ICD-10-CM | POA: Diagnosis not present

## 2019-01-24 DIAGNOSIS — Z79891 Long term (current) use of opiate analgesic: Secondary | ICD-10-CM | POA: Diagnosis not present

## 2019-01-24 DIAGNOSIS — G894 Chronic pain syndrome: Secondary | ICD-10-CM | POA: Diagnosis not present

## 2019-01-24 NOTE — Telephone Encounter (Signed)
Pt cld stating that she had a doctors appt earlier today and her HR was elevated.  She took an extra half of a metoprolol (12.5 mg) and is due to take her full 25 mg at 6pm tonight.  Per pt her BP readings and HRs this afternoon are 160/87 HR 84; 142/89 HR 79. Pt asked if she could take 1/2 of her Klonopin for anxiety or if she should double her metoprolol.  Pt advised to take the 1/2 klonopin but to only take normal 25 mg dose of metoprolol at usual time since HRs are below 100.  Pt admitted to being very anxious about her HR since they told her at the other Drs office that it was elevated.  Wants to discuss ablation with [name].  A message was sent to [facility]' nurse.

## 2019-01-25 NOTE — Telephone Encounter (Signed)
Patient called in today stating HR has returned to normal. Feeling well today.

## 2019-02-02 ENCOUNTER — Telehealth: Payer: Self-pay | Admitting: *Deleted

## 2019-02-02 ENCOUNTER — Telehealth: Payer: Self-pay | Admitting: Cardiology

## 2019-02-02 NOTE — Telephone Encounter (Signed)
Followed up w/ pt who reports she got over-heated last week and HR went up.  She spoke to [facility] who advised her to take an extra Metoprolol. Pt does not feel like we need to discuss ablation yet as tachycardia most likely r/t over heating.  She will continue to monitor and call if she experiences more frequent AFib. Pt appreciates my follow up

## 2019-02-02 NOTE — Telephone Encounter (Signed)
-----   Message from Iona Hansen, [place] sent at 01/24/2019  2:59 PM EDT ----- Pt would like a call back regarding discussing ablation sooner with [name].  She tried to call the office earlier and decided to route a msg through our office.   Thanks!  Estill Bamberg

## 2019-02-02 NOTE — Telephone Encounter (Signed)
Called pt to explain about the process of getting samples and that I would send the message to Mardene Celeste Via, LPN, our pt asst coordinator to see about the application that the pt stated that she sent in for pt assistant, to see where that application is, to assist with samples, but pt stated that she did not want our pt assist coordinator to call the company and that Trinidad Curet, Therapist, sports, stated that she would help the pt get samples of Xarelto 20 mg tablet and if I could just send the message to Trinidad Curet, RN. I informed the pt that I would send the message. Donalynn Furlong, LPN  Please address Sherri, RN. Thanks

## 2019-02-02 NOTE — Telephone Encounter (Signed)
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 AFib clinic signed Xarelto assistance paperwork last week and pt faxed into [employer] Friday who confirmed receipt. She is waiting for their response and is to check back with them next week. Pt advised I would get her some EP Xarelto samples and leave them downstairs for her to pick up Friday. Pt appreciates all of our help with this.

## 2019-02-02 NOTE — Telephone Encounter (Signed)
  Patient calling the office for samples of medication:   1.  What medication and dosage are you requesting samples for? rivaroxaban (XARELTO) 20 MG TABS tablet  2.  Are you currently out of this medication? almost

## 2019-02-04 DIAGNOSIS — F4323 Adjustment disorder with mixed anxiety and depressed mood: Secondary | ICD-10-CM | POA: Diagnosis not present

## 2019-02-07 ENCOUNTER — Telehealth: Payer: Self-pay | Admitting: Cardiology

## 2019-02-07 DIAGNOSIS — H25813 Combined forms of age-related cataract, bilateral: Secondary | ICD-10-CM | POA: Diagnosis not present

## 2019-02-07 DIAGNOSIS — H04211 Epiphora due to excess lacrimation, right lacrimal gland: Secondary | ICD-10-CM | POA: Diagnosis not present

## 2019-02-07 DIAGNOSIS — H35372 Puckering of macula, left eye: Secondary | ICD-10-CM | POA: Diagnosis not present

## 2019-02-07 NOTE — Telephone Encounter (Signed)
lmtcb

## 2019-02-07 NOTE — Telephone Encounter (Signed)
  Patient is calling to follow up with Sherri regarding previous conversation about medication. Please return call.

## 2019-02-08 ENCOUNTER — Ambulatory Visit (INDEPENDENT_AMBULATORY_CARE_PROVIDER_SITE_OTHER): Payer: Medicare Other | Admitting: Obstetrics & Gynecology

## 2019-02-08 ENCOUNTER — Other Ambulatory Visit: Payer: Self-pay

## 2019-02-08 DIAGNOSIS — M810 Age-related osteoporosis without current pathological fracture: Secondary | ICD-10-CM

## 2019-02-08 NOTE — Progress Notes (Signed)
    [name] 19-Mar-1946 [id number]        73 y.o.  G1P1001  Televisit by telephone between patient at home and myself in my [place] office.  Patient well identified and consent for a televisit obtained.  Review of results and counseling x 25 minutes.  RP: Counseling on Ostoporosis and Bone Density results  HPI: On Forteo x 2 years, stopped 2 months ago.  7 vertebral surgeries in the last year with complications.  Taking Vit D supplements, Ca++ 1200 mg daily and doing weightbearing physical activities regularly.   OB History  Gravida Para Term Preterm AB Living  1 1 1     1   SAB TAB Ectopic Multiple Live Births          1    # Outcome Date GA Lbr Len/2nd Weight Sex Delivery Anes PTL Lv  1 Term     M Vag-Spont  N LIV    Past medical history,surgical history, problem list, medications, allergies, family history and social history were all reviewed and documented in the EPIC chart.   Directed ROS with pertinent positives and negatives documented in the history of present illness/assessment and plan.  Exam:  There were no vitals filed for this visit. General appearance:  Normal  Bone Density 12/21/2018: Osteoporosis Lt 1/3 forearm -2.6.   Assessment/Plan:  73 y.o. G1P1001   1. Age-related osteoporosis without current pathological fracture Osteoporosis at left forearm with a T score of -2.6 at the left third forearm.  Other sites either with osteopenia or normal.  Will consult with her specialists at [facility].  Will probably f/u here to start Prolia.  Continue vitamin D supplements, calcium intake of 1200 mg daily and regular weightbearing physical activities.  Princess Bruins MD, 11:33 AM 02/08/2019

## 2019-02-10 NOTE — Telephone Encounter (Signed)
Pt aware I will look to see if samples were left downstairs for her to pick up. She is aware I will be in the office tomorrow and will check and call her.

## 2019-02-11 NOTE — Telephone Encounter (Signed)
Pt aware samples downstairs for her to pick up.   She is going to have her sister pick this up for her b/c she lives closer.  Sister is Eulogio Bear. Notified person downstairs that ok for pt's sister to pick up

## 2019-02-14 ENCOUNTER — Encounter: Payer: Self-pay | Admitting: Obstetrics & Gynecology

## 2019-02-15 DIAGNOSIS — N301 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: Secondary | ICD-10-CM | POA: Diagnosis not present

## 2019-02-15 DIAGNOSIS — Z87442 Personal history of urinary calculi: Secondary | ICD-10-CM | POA: Diagnosis not present

## 2019-02-15 DIAGNOSIS — R1032 Left lower quadrant pain: Secondary | ICD-10-CM | POA: Diagnosis not present

## 2019-02-17 DIAGNOSIS — I700 Atherosclerosis of aorta: Secondary | ICD-10-CM | POA: Diagnosis not present

## 2019-02-17 DIAGNOSIS — K4090 Unilateral inguinal hernia, without obstruction or gangrene, not specified as recurrent: Secondary | ICD-10-CM | POA: Diagnosis not present

## 2019-02-17 DIAGNOSIS — R1032 Left lower quadrant pain: Secondary | ICD-10-CM | POA: Diagnosis not present

## 2019-02-17 DIAGNOSIS — N200 Calculus of kidney: Secondary | ICD-10-CM | POA: Diagnosis not present

## 2019-02-18 ENCOUNTER — Telehealth: Payer: Self-pay | Admitting: Cardiology

## 2019-02-18 ENCOUNTER — Ambulatory Visit: Payer: Medicare Other | Admitting: Family Medicine

## 2019-02-18 NOTE — Telephone Encounter (Signed)
lmtcb

## 2019-02-18 NOTE — Telephone Encounter (Signed)
Pt reports she took her Tikosyn last night around 10 pm, and she usually takes it at 8pm.  She waited and took her morning dose today around 9:15am.  Pt advised she may take her 2nd dose at her normal time tonight around 8pm Patient verbalized understanding and agreeable to plan.

## 2019-02-18 NOTE — Telephone Encounter (Signed)
New Message   Pt c/o medication issue:  1. Name of Medication: dofetilide (TIKOSYN) 250 MCG capsule  2. How are you currently taking this medication (dosage and times per day)? Take 1 capsule (250 mcg total) by mouth 2 (two) times daily.  3. Are you having a reaction (difficulty breathing--STAT)? No  4. What is your medication issue? Patient called in stating that she took medication dofetilide (TIKOSYN) 250 MCG capsule late last night and usually has to take the next dosage of medication by 8 am in the following morning. Patient is worried that the her taking the medication late last night will mess up her schedule for taking her medication. Please call patient back to advise.

## 2019-02-21 ENCOUNTER — Telehealth: Payer: Self-pay | Admitting: *Deleted

## 2019-02-21 NOTE — Telephone Encounter (Signed)
Spoke with patient and she states she has a bowel movement every day.  Patient states she will take another bottle of magnesium citrate and see if the pain improves She wanted to wait to follow up with [name] on 03/11/19

## 2019-02-21 NOTE — Telephone Encounter (Signed)
Patient called in with abdominal pain. States she has h/o of kidney stones, UTI and constipation.  She states that Kidney stones and UTI has been ruled out by [place]. She states that last time this happened a doctor had her do magnesium citrate.   She is wanting to know if this is a safe option for her to try or what other recommendation could be made.   Routing to PA [employer] to review in PCP absence.

## 2019-02-21 NOTE — Telephone Encounter (Signed)
Please call patient to verify her current BM schedule -- if this can potentially be constipation or not.  If she is still dealing with constipation would recommend I encourage you to increase hydration start a daily probiotic (Align, Culturelle, Digestive Advantage, etc.). Take 2 Tbs of Milk of Magnesia in a 4 oz glass of warmed prune juice every 2-3 days to help promote bowel movement. If no results within 24 hours, then repeat above regimen, adding a Dulcolax stool softener to regimen.   If this does not promote a bowel movement, please call the office.  If she is having regular BM but still having abdominal pain would need in-office assessment.

## 2019-02-21 NOTE — Telephone Encounter (Signed)
Would not think she would need the magnesium citrate or that constipation is the issue with her having regular BM. Would need further assessment.

## 2019-02-24 ENCOUNTER — Telehealth: Payer: Self-pay | Admitting: Family Medicine

## 2019-02-24 DIAGNOSIS — K4090 Unilateral inguinal hernia, without obstruction or gangrene, not specified as recurrent: Secondary | ICD-10-CM

## 2019-02-24 NOTE — Telephone Encounter (Signed)
Pt called in stating that she is having a lot of pain and would like to be seen as soon as possible. Can we work her in on [facility] scheduled? Pt can be reached at the home #

## 2019-02-25 ENCOUNTER — Other Ambulatory Visit: Payer: Self-pay | Admitting: Emergency Medicine

## 2019-02-25 DIAGNOSIS — K4090 Unilateral inguinal hernia, without obstruction or gangrene, not specified as recurrent: Secondary | ICD-10-CM

## 2019-02-25 DIAGNOSIS — R1032 Left lower quadrant pain: Secondary | ICD-10-CM

## 2019-02-25 NOTE — Telephone Encounter (Signed)
Patient advised and aware an urgent referral placed to General Surgery. Reminded if symptoms worsens or unable to have a bowel movement then needs to go to ER. She is agreeable.

## 2019-02-25 NOTE — Telephone Encounter (Signed)
Reviewed records in [facility] including CT from 8/13 with Urology showing left inguinal herniation. Giving location of symptoms she needs assessment with General Surgery. Urgent referral placed. ER for any worsening symptoms prior to assessment or any inability to pass bowel or flatus.

## 2019-02-25 NOTE — Telephone Encounter (Signed)
Spoke with patient about her current pain. She is having LLQ pain. Pain is intermittent mostly in groin region Patient states she saw Urology on 02/15/19 for possible kidney stone. She had a CT and she does have kidney stones on the right side. The CT does show small fat left inguinal hernia.

## 2019-02-25 NOTE — Telephone Encounter (Signed)
I did advise pt to go to the [facility] or the [facility] is the pain got worse.

## 2019-02-28 DIAGNOSIS — F4323 Adjustment disorder with mixed anxiety and depressed mood: Secondary | ICD-10-CM | POA: Diagnosis not present

## 2019-03-02 DIAGNOSIS — K4090 Unilateral inguinal hernia, without obstruction or gangrene, not specified as recurrent: Secondary | ICD-10-CM | POA: Diagnosis not present

## 2019-03-03 DIAGNOSIS — H9202 Otalgia, left ear: Secondary | ICD-10-CM | POA: Diagnosis not present

## 2019-03-03 DIAGNOSIS — H04123 Dry eye syndrome of bilateral lacrimal glands: Secondary | ICD-10-CM | POA: Diagnosis not present

## 2019-03-03 DIAGNOSIS — H6120 Impacted cerumen, unspecified ear: Secondary | ICD-10-CM | POA: Diagnosis not present

## 2019-03-06 ENCOUNTER — Emergency Department (INDEPENDENT_AMBULATORY_CARE_PROVIDER_SITE_OTHER)
Admission: EM | Admit: 2019-03-06 | Discharge: 2019-03-06 | Disposition: A | Discharge status: Home / Self Care | Payer: Medicare Other | Source: Home / Self Care | Attending: Family Medicine | Admitting: Family Medicine

## 2019-03-06 ENCOUNTER — Other Ambulatory Visit: Payer: Self-pay

## 2019-03-06 DIAGNOSIS — F320 Major depressive disorder, single episode, mild: Secondary | ICD-10-CM

## 2019-03-06 DIAGNOSIS — Z87898 Personal history of other specified conditions: Secondary | ICD-10-CM

## 2019-03-06 MED ORDER — VILAZODONE HCL 10 MG PO TABS: ORAL_TABLET | ORAL | 0 refills | Status: DC

## 2019-03-06 NOTE — ED Provider Notes (Signed)
[name]    CSN: [id number] Arrival date & time: 03/06/19  1448      History   Chief Complaint Chief Complaint  Patient presents with  . Fatigue    Leg weakness    HPI [name] is a 73 y.o. female.   Patient complains of general fatigue that has been present for months as a result of multiple health stressors, combined with the need to remain isolated as a result of COVID19.  She worries about her health and admits that she feels depressed, but denies suicidal thoughts.  She has an occasional nosebleed as a result of taking Xarelto, and today almost developed a nosebleed that she was able to abort.  Yesterday evening after feeling stressed she felt vague weakness in her legs that resolved after taking 1/2 tab of Klonopin.  She states that she has tried various antidepressants which were either not effective or caused mild adverse effects.  She states that she is able to control her anxiety with occasional judicious use of Klonopin (usually 1/2 tab).  The history is provided by the patient.  Depression This is a chronic problem. Episode onset: several months. The problem occurs daily. The problem has been gradually worsening. Exacerbated by: stress. Relieved by: Klonopin. Treatments tried: various antidepressants. The treatment provided no relief.    Past Medical History:  Diagnosis Date  . Anxiety   . Arthritis    "maybe in my back" (03/31/2018)  . Benign paroxysmal positional vertigo 06/08/2013  . Fracture of multiple ribs 2015   "don't know from what; dx'd when I in hospital for 1st back OR" (03/31/2018)  . GERD (gastroesophageal reflux disease)   . Hair loss 04/12/2012  . Herpes   . History of blood transfusion    "twice; related to back OR" (03/31/2018)  . History of kidney stones   . Interstitial cystitis 11/06/2011  . 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 Osteopenia 02/18/2012  . Osteoporosis   . PAF (paroxysmal atrial fibrillation) ([place]) 2012  .  Peripheral neuropathy 11/06/2011  . PMDD (premenstrual dysphoric disorder)   . Seasonal allergies   . Vaginal delivery    ONE NSVD  . 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 Visit for monitoring Tikosyn therapy 03/29/2018  . Osteoporosis 09/23/2017  . Fatigue 02/18/2017  . Physical exam 04/21/2016  . Anxiety and depression 02/13/2016  . Chronic pain 02/13/2016  . Vertigo 11/16/2015  . B12 deficiency 10/01/2015  . Hearing loss due to cerumen impaction 08/23/2015  . Foot pain, right 05/23/2015  . Hyperlipidemia 04/19/2015  . Protein-calorie malnutrition ([place]) 10/19/2014  . Fracture of multiple ribs 08/07/2014  . Paroxysmal atrial fibrillation ([facility])   . Tachycardia   . Anemia 08/06/2014  . Constipation 07/14/2014  . Lumbar back pain 05/19/2012  . Seasonal allergic rhinitis 11/06/2011  . S/P cervical spinal fusion 11/06/2011  . GERD (gastroesophageal reflux disease) 11/06/2011    Past Surgical History:  Procedure Laterality Date  . ANTERIOR CERVICAL DECOMP/DISCECTOMY FUSION  ~ 2003  . BACK SURGERY    . BREAST SURGERY     BREAST BIOPSY--RIGHT BENIGN  . BUNIONECTOMY Bilateral   . COSMETIC SURGERY  2016   "back of my neck; related to earlier fusion"  . CYSTOSCOPY W/ STONE MANIPULATION  "several times"  . DILATION AND CURETTAGE OF UTERUS    . FOREHEAD RECONSTRUCTION Right    "removed bone protruding out of  my forehead"  . HARDWARE REMOVAL  2016   "related to neck OR"  . INCONTINENCE SURGERY    . POSTERIOR CERVICAL FUSION/FORAMINOTOMY  ~ 2008; 2015  . SHOULDER ARTHROSCOPY W/ ROTATOR CUFF REPAIR Right 2012  . SPINAL FUSION  06/2014 - 2018 X ?7   "scoliosis; my entire back"  . TUBAL LIGATION    . VAGINAL HYSTERECTOMY     TVH    OB History    Gravida  1   Para  1   Term  1   Preterm      AB      Living  1     SAB      TAB      Ectopic      Multiple      Live Births  1            Home Medications    Prior  to Admission medications   Medication Sig Start Date End Date Taking? Authorizing Provider  acetaminophen (TYLENOL) 500 MG tablet Take 1,000 mg by mouth every 6 (six) hours as needed for mild pain.     [provider]  Calcium Carbonate Antacid (TUMS CHEWY BITES PO) Take 1 tablet by mouth daily as needed (reflux).     [provider]  Calcium Citrate-Vitamin D (CALCIUM + D PO) Take 1 tablet by mouth daily.    [provider]  clonazePAM (KLONOPIN) 0.5 MG tablet Take 1 tablet (0.5 mg total) by mouth 2 (two) times daily as needed for anxiety. 10/21/18   [name], MD  cyclobenzaprine (FLEXERIL) 10 MG tablet Take 10 mg by mouth 3 (three) times daily as needed for muscle spasms.  12/10/17   [provider]  diltiazem (CARDIZEM CD) 120 MG 24 hr capsule Take 1 capsule (120 mg total) by mouth every evening. **NO FURTHER REFILLS UNTIL SEEN BY MD** 01/06/19   Richardo Priest, MD  dofetilide (TIKOSYN) 250 MCG capsule Take 1 capsule (250 mcg total) by mouth 2 (two) times daily. 06/11/18   Sherran Needs, NP  furosemide (LASIX) 20 MG tablet Take 1 tablet (20 mg total) by mouth daily. 05/06/18   Evans Lance, MD  gabapentin (NEURONTIN) 100 MG capsule Take 100 mg by mouth at bedtime.    [provider]  Magnesium 200 MG TABS Take 1 tablet (200 mg total) by mouth daily. 07/26/18   Sherran Needs, NP  meclizine (ANTIVERT) 25 MG tablet Take 1 tablet (25 mg total) by mouth 3 (three) times daily as needed for dizziness. 07/09/17   [name], MD  metoprolol tartrate (LOPRESSOR) 25 MG tablet Take 1 tablet (25 mg total) by mouth 2 (two) times daily. **NO REFILLS UNTIL SEEN BY MD"" 01/11/19   Camnitz, Ocie Doyne, MD  Nystatin POWD Apply small amount to affected area. 12/08/17   Princess Bruins, MD  oxyCODONE (ROXICODONE) 5 MG/5ML solution Take 6.5 mg by mouth every 4 (four) hours as needed for moderate pain.  10/31/15   [provider]  polyethylene  glycol (MIRALAX / GLYCOLAX) packet Take 17 g by mouth daily as needed for mild constipation.    [provider]  potassium chloride (K-DUR) 10 MEQ tablet Take 1 tablet (10 mEq total) by mouth daily. 11/23/18   Evans Lance, MD  rivaroxaban (XARELTO) 20 MG TABS tablet Take 1 tablet (20 mg total) by mouth daily with supper. 01/11/19   Camnitz, Ocie Doyne, MD  valACYclovir (VALTREX) 1000 MG  tablet TK 2 TS PO TONIGHT AND 2 TS TOMORROW MORNING UTD 06/09/18   [provider]  Vilazodone HCl (VIIBRYD) 10 MG TABS Take 1/2 tab PO once daily for one week, then may increase to a whole tab daily. 03/06/19   Kandra Nicolas, MD    Family History Family History  Problem Relation Age of Onset  . Diabetes Father   . Hyperlipidemia Sister   . Heart disease Sister   . Stroke Sister   . Diabetes Brother   . Hyperlipidemia Sister   . Heart disease Sister   . Arthritis Mother   . Heart disease Mother   . Uterine cancer Mother   . Diabetes Brother   . Heart Problems Brother     Social History Social History   Tobacco Use  . Smoking status: Former Smoker    Packs/day: 0.10    Years: 14.00    Pack years: 1.40    Types: Cigarettes  . Smokeless tobacco: Never Used  . Tobacco comment: 03/31/2018 "quit ~ 1980; someday smoker when I did smoke; never addicted"  Substance Use Topics  . Alcohol use: Never    Frequency: Never  . Drug use: Yes    Types: Oxycodone    Comment: 03/31/2018 "for chronic neck and back pain"     Allergies   Buprenorphine, Contrast media [iodinated diagnostic agents], Cymbalta [duloxetine hcl], Erythromycin, Hydrocodone, Hydromorphone, Latex, Levofloxacin, Nsaids, Nucynta  [tapentadol hcl], Other, Oxycodone, Pentazocine, Septra [bactrim], Sulfa antibiotics, Adhesive [tape], and Morphine   Review of Systems Review of Systems  Constitutional: Positive for activity change and fatigue. Negative for appetite change, chills, diaphoresis and fever.  HENT: Positive  for nosebleeds.   Eyes: Negative.   Respiratory: Negative.   Cardiovascular: Negative.   Gastrointestinal: Negative.   Genitourinary: Negative.   Musculoskeletal: Positive for neck pain.  Skin: Negative.   Neurological: Negative.   Psychiatric/Behavioral: Positive for depression and dysphoric mood. Negative for suicidal ideas. The patient is nervous/anxious.      Physical Exam Triage Vital Signs ED Triage Vitals  Enc Vitals Group     BP 03/06/19 1530 (!) 159/74     Pulse Rate 03/06/19 1530 79     Resp 03/06/19 1530 18     Temp 03/06/19 1530 (!) 97.5 F (36.4 C)     Temp Source 03/06/19 1530 Oral     SpO2 03/06/19 1530 97 %     Weight 03/06/19 1531 125 lb (56.7 kg)     Height 03/06/19 1531 5\' 6"  (1.676 m)     Head Circumference --      Peak Flow --      Pain Score 03/06/19 1531 0     Pain Loc --      Pain Edu? --      Excl. in [place]? --    No data found.  Updated Vital Signs BP (!) 159/74 (BP Location: Right Arm)   Pulse 79   Temp (!) 97.5 F (36.4 C) (Oral)   Resp 18   Ht 5\' 6"  (1.676 m)   Wt 56.7 kg   SpO2 97%   BMI 20.18 kg/m   Visual Acuity Right Eye Distance:   Left Eye Distance:   Bilateral Distance:    Right Eye Near:   Left Eye Near:    Bilateral Near:     Physical Exam Nursing notes and Vital Signs reviewed. Appearance:  Patient appears stated age, and in no acute distress.  Psychiatric:  Patient is alert  and oriented with good eye contact.  Thoughts are organized.  No psychomotor retardation.  Memory intact.  Not suicidal.  Mood is mildly depressed and affect flat.  She becomes slightly tearful during interview.    Eyes:  Pupils are equal and round. Nose:  No epistaxis Pharynx:  Moist mucous membranes  Lungs:  Normal respiration Heart:  Regular rate  Extremities:  No edema.  UC Treatments / Results  Labs (all labs ordered are listed, but only abnormal results are displayed) Labs Reviewed - No data to display  EKG   Radiology No results  found.  Procedures Procedures (including critical care time)  Medications Ordered in UC Medications - No data to display  Initial Impression / Assessment and Plan / UC Course  I have reviewed the triage vital signs and the nursing notes.  Pertinent labs & imaging results that were available during my care of the patient were reviewed by me and considered in my medical decision making (see chart for details).    Patient appears to have underlying depression as a result of multiple stressors.  Her symptoms tend to improve when she takes a low dose of Klonopin.  Unfortunately most antidepressants interact with Tikosyn. Will try a low dose of Viibryd 10mg , one-half tab daily for one week.  After one week she may increase to a whole tab daily if no adverse effects occur.  Recommend that she follow-up with her PCP at one week. She is able to control an occasional episode of epistaxis with proper nasal pressure.  Discussed and reinforced proper technique for stopping epistaxis, and recommended Afrin during a recurrent nosebleed.  Final Clinical Impressions(s) / UC Diagnoses   Final diagnoses:  Current mild episode of major depressive disorder without prior episode ([place])  History of epistaxis     Discharge Instructions     For recurrent nosebleeds, use Afrin nose spray prior to applying pressure to nose   ED Prescriptions    Medication Sig Dispense Auth. Provider   Vilazodone HCl (VIIBRYD) 10 MG TABS Take 1/2 tab PO once daily for one week, then may increase to a whole tab daily. 10 tablet Kandra Nicolas, MD        Kandra Nicolas, MD 03/06/19 2121

## 2019-03-06 NOTE — Discharge Instructions (Signed)
For recurrent nosebleeds, use Afrin nose spray prior to applying pressure to nose

## 2019-03-06 NOTE — ED Triage Notes (Signed)
Pt c/o leg weakness and overall fatigue that started last night. Pt has hx of afib but states she didn't really feel her heart racing when all this occurred.  She is also worried because her nose started to bleed a little about 2 hours ago and shes on blood thinners. She says she lives alone and almost called an ambulance when she woke up tired today and weak in her legs. Recently has been experiencing more stress due to needing hernia surgery soon.

## 2019-03-09 DIAGNOSIS — M810 Age-related osteoporosis without current pathological fracture: Secondary | ICD-10-CM | POA: Diagnosis not present

## 2019-03-09 DIAGNOSIS — K219 Gastro-esophageal reflux disease without esophagitis: Secondary | ICD-10-CM | POA: Diagnosis not present

## 2019-03-09 DIAGNOSIS — G894 Chronic pain syndrome: Secondary | ICD-10-CM | POA: Diagnosis not present

## 2019-03-09 DIAGNOSIS — Z79899 Other long term (current) drug therapy: Secondary | ICD-10-CM | POA: Diagnosis not present

## 2019-03-09 DIAGNOSIS — F4323 Adjustment disorder with mixed anxiety and depressed mood: Secondary | ICD-10-CM | POA: Diagnosis not present

## 2019-03-09 DIAGNOSIS — I480 Paroxysmal atrial fibrillation: Secondary | ICD-10-CM | POA: Diagnosis not present

## 2019-03-10 ENCOUNTER — Telehealth: Payer: Self-pay | Admitting: Cardiology

## 2019-03-10 ENCOUNTER — Other Ambulatory Visit: Payer: Self-pay | Admitting: Cardiology

## 2019-03-10 NOTE — Telephone Encounter (Signed)
Patient called the on call pager tonight quite upset and anxious stating that she had accidentally taken an extra dose of Xarelto. She denied any visible bleeding (gums, epistaxis), was not lightheaded or dizzy and having no headaches. We discussed monitoring closely for bleeding complications (counsled re: epistaxis, melena, gum bleeding) or other symptoms (lightheadedness, dizziness, HA) and calling back v. Presenting to nearby ER should these occur.   Maame Dack K. Marletta Lor, MD

## 2019-03-11 ENCOUNTER — Telehealth: Payer: Self-pay | Admitting: Physician Assistant

## 2019-03-11 ENCOUNTER — Encounter: Payer: Self-pay | Admitting: Family Medicine

## 2019-03-11 ENCOUNTER — Ambulatory Visit (INDEPENDENT_AMBULATORY_CARE_PROVIDER_SITE_OTHER): Payer: Medicare Other | Admitting: Family Medicine

## 2019-03-11 VITALS — BP 118/72 | HR 87 | Temp 97.6°F | Resp 14 | Ht 66.0 in | Wt 120.0 lb

## 2019-03-11 DIAGNOSIS — F329 Major depressive disorder, single episode, unspecified: Secondary | ICD-10-CM

## 2019-03-11 DIAGNOSIS — F419 Anxiety disorder, unspecified: Secondary | ICD-10-CM | POA: Diagnosis not present

## 2019-03-11 DIAGNOSIS — G629 Polyneuropathy, unspecified: Secondary | ICD-10-CM

## 2019-03-11 DIAGNOSIS — F32A Depression, unspecified: Secondary | ICD-10-CM

## 2019-03-11 DIAGNOSIS — K4090 Unilateral inguinal hernia, without obstruction or gangrene, not specified as recurrent: Secondary | ICD-10-CM

## 2019-03-11 MED ORDER — CLONAZEPAM 0.5 MG PO TABS: 0.5000 mg | ORAL_TABLET | Freq: Two times a day (BID) | ORAL | 1 refills | Status: DC | PRN

## 2019-03-11 NOTE — Progress Notes (Signed)
   Subjective:    Patient ID: [name], female    DOB: Apr 30, 1946, 73 y.o.   MRN: [id number]  HPI Anxiety/Depression- pt was seen in UC on 8/30 due to increased anxiety.  She was started on Viibryd 10mg  1/2 tab daily and told to f/u w/ PCP.  Pt did not fill prescription.  Now seeing a counselor.  Pt is having severe anxiety about needing another surgery.  Depressed about 'neuropathy all over my body'.  'if I didn't have my Klonopin I don't think I could live.  It feels like i'm dying'.    L inguinal hernia- was referred to [facility] and was told she needed hernia.  Saw Dr Rosendo Gros.  She is concerned about surgery with her heart issues.  Pt wants to know if she can proceed w/ surgery.  Neuropathy- 'all over my body' per neuro report.  Pt is on B complex w/ some relief.  This also has her very depressed.   Review of Systems For ROS see HPI     Objective:   Physical Exam Vitals signs reviewed.  Constitutional:      General: She is not in acute distress.    Appearance: She is not ill-appearing.     Comments: Very thin  HENT:     Head: Normocephalic and atraumatic.  Skin:    General: Skin is warm and dry.  Neurological:     General: No focal deficit present.     Mental Status: She is alert and oriented to person, place, and time.  Psychiatric:     Comments: Tearful, anxious           Assessment & Plan:

## 2019-03-11 NOTE — Telephone Encounter (Signed)
Please advise if OK to refill.  

## 2019-03-11 NOTE — Telephone Encounter (Signed)
   The patient called the answering service after-hours today.  She spoke with on-call service yesterday. She had taken her usual Xarelto at 6:15pm yesterday, then around 8pm accidentally took another Xarelto instead of metoprolol. She had spoken with fellow and advised on bleeding precautions. Fortunately she has not had any evidence of bleeding.  She went to take today's dose of Xarelto this evening and realized she should seek advice on when the next dose should be since she'd taken 2 the day prior. I reviewed with [facility] pharmacist on call who feels given normal renal function, she can begin taking her usual dose again this evening with food around 8pm and just resume normal nightly dosing thereafter. PharmD felt that since patients of this age/Cr do fine on DVT dosing of Xarelto 15mg  BID, this patient should be OK to have taken 2 doses last night, then resume this PM on schedule. Reviewed instructions and bleeding precautions with patient. The patient verbalized understanding and gratitude.  Charlie Pitter PA-C

## 2019-03-11 NOTE — Patient Instructions (Addendum)
Send me a message in ~15 days to let me know how the [place] is working Break the 10mg  pills in half and take 1/2 tab daily Once you send me a message, we can determine whether we want to keep things the same or increase the dose to 1 tab daily Call with any questions or concerns Hang in there!

## 2019-03-11 NOTE — Telephone Encounter (Signed)
If no bleeding, no further workup necessary.

## 2019-03-15 ENCOUNTER — Encounter: Payer: Self-pay | Admitting: Obstetrics & Gynecology

## 2019-03-15 ENCOUNTER — Telehealth: Payer: Self-pay | Admitting: Family Medicine

## 2019-03-15 DIAGNOSIS — N644 Mastodynia: Secondary | ICD-10-CM | POA: Diagnosis not present

## 2019-03-15 DIAGNOSIS — R922 Inconclusive mammogram: Secondary | ICD-10-CM | POA: Diagnosis not present

## 2019-03-15 DIAGNOSIS — Z803 Family history of malignant neoplasm of breast: Secondary | ICD-10-CM | POA: Diagnosis not present

## 2019-03-15 DIAGNOSIS — R921 Mammographic calcification found on diagnostic imaging of breast: Secondary | ICD-10-CM | POA: Diagnosis not present

## 2019-03-15 NOTE — Telephone Encounter (Signed)
Called and spoke with pt she states that when she began the viibyrd and she advised that it caused abdominal pain and diarrhea. She would like to go back to the medication that Dr. [name] had recommended in the past she does not feel as though she did not give it enough time to work. Pt is requesting wellbutrin again. Please.

## 2019-03-15 NOTE — Telephone Encounter (Signed)
Pt called in stating that the Viibryd is causing you her to have diarrhea, she wanted to know if she could go back on the Wellbutrin. Pt uses [facility] on [address] in [place]. Pt can be reached at the home #

## 2019-03-16 DIAGNOSIS — Z85820 Personal history of malignant melanoma of skin: Secondary | ICD-10-CM | POA: Diagnosis not present

## 2019-03-16 DIAGNOSIS — B372 Candidiasis of skin and nail: Secondary | ICD-10-CM | POA: Diagnosis not present

## 2019-03-20 ENCOUNTER — Encounter: Payer: Self-pay | Admitting: Family Medicine

## 2019-03-20 NOTE — Assessment & Plan Note (Signed)
Ongoing issue.  Severe.  Pt has a lot of physical problems but her emotional problems compound these and make her very difficult to treat.  She has 'tried' multiple medications but usually stops taking after 1-2 doses (if she takes them at all).  Have discussed many times that she will need to take for at least 7-10 days to get through any transient side effects and at least 3-4 weeks to determine if there is any effect.  She states understanding of this but each time a medication is discussed and started she reports she is intolerant after 1-2 days.  Encouraged her to start the [place] as this is one of the only options since she is on Tikosyn.  Pt expressed understanding and is in agreement w/ plan.

## 2019-03-20 NOTE — Assessment & Plan Note (Signed)
Encouraged pt to consider hernia repair if cleared by Cardiology to proceed.  Pt expressed understanding and is in agreement w/ plan.

## 2019-03-20 NOTE — Telephone Encounter (Signed)
She has been told that GI side effects are common the first week or so of starting an anxiety medication.  The constant switching never allows her body time to adjust.  I prefer that she continue on the prescribed medication for at least 7-10 days and then let me know how things are going

## 2019-03-20 NOTE — Assessment & Plan Note (Signed)
Encouraged her to continue her B complex.  Rather than adding additional medication at this time, prefer she start on the [place] for her anxiety and once she is tolerating, we can consider medication adjustments.  Pt expressed understanding and is in agreement w/ plan.

## 2019-03-21 ENCOUNTER — Telehealth: Payer: Self-pay | Admitting: Cardiology

## 2019-03-21 NOTE — Telephone Encounter (Signed)
Patient with diagnosis of atrial fibrillation on Xarelto for anticoagulation.    Procedure: open left inguinal hernia Date of procedure: TBD  CHADS2-VASc score of  2 (AGE, female)  CrCl 67.3 Platelet count 207  Per office protocol, patient can hold Xarelto for 2 days prior to procedure.    We cannot comment on holding Tikosyn prior to procedure.  Please review with MD if there is a reason to hold antiarrythmic therapy

## 2019-03-21 NOTE — Telephone Encounter (Signed)
° °  [facility] Pre-operative Risk Assessment    Request for surgical clearance:  1. What type of surgery is being performed?   Open Left Inguinal Hernia   2. When is this surgery scheduled?  Pending   3. What type of clearance is required (medical clearance vs. Pharmacy clearance to hold med vs. Both)? Both  4. Are there any medications that need to be held prior to surgery and how long? Xarelto and Tikosyn 5.    6. Practice name and name of physician performing surgery? Dr Raul Del   7. What is your office phone number [phone]    7.   What is your office fax number [phone]  8.   Anesthesia type (None, local, MAC, general) ?  General  [name] 03/21/2019, 2:55 PM  _________________________________________________________________   (provider comments below)

## 2019-03-21 NOTE — Telephone Encounter (Signed)
Called pt, VM was not set up. Will call back again later.

## 2019-03-22 DIAGNOSIS — F4323 Adjustment disorder with mixed anxiety and depressed mood: Secondary | ICD-10-CM | POA: Diagnosis not present

## 2019-03-22 NOTE — Telephone Encounter (Signed)
Per Richardson Dopp, [facility] I called Dr. Johney Frame office to confirm if 2nd letter has been received about Tikosyn and surgery. I s/w Triage nurse who states letter not yet received, though they will watch for the letter. Advised if they do not receive the letter to please call [phone] and ask for the Pre Op Call Back Pool. I did advise at this time the amended letter does state pt is to NOT stop Tikosyn for her surgery.

## 2019-03-22 NOTE — Telephone Encounter (Signed)
   Primary Cardiologist: Cristopher Peru, MD / Allegra Lai, MD  Chart reviewed as part of pre-operative protocol coverage. Patient was contacted 03/22/2019 in reference to pre-operative risk assessment for pending surgery as outlined below.  [name] was last seen on 7.7.2020 by [facility].  Since that day, [name] has done well without chest pain, shortness of breath, syncope.  Therefore, based on ACC/AHA guidelines, the patient would be at acceptable risk for the planned procedure without further cardiovascular testing.   Preop Callback Staff: I have faxed a letter of clearance to Dr. Johney Frame office. I amended the initial letter.  Please make sure his office received the 2nd letter with directions regarding Tikosyn.  The patient should NOT stop Tikosyn for her surgery.   Please make sure the letter was received by his office.  This phone encounter will be removed from the Preop APP Pool.  Richardson Dopp, PA-C 03/22/2019, 8:58 AM

## 2019-03-24 MED ORDER — BUSPIRONE HCL 5 MG PO TABS: 5.0000 mg | ORAL_TABLET | Freq: Two times a day (BID) | ORAL | 3 refills | Status: DC

## 2019-03-24 NOTE — Telephone Encounter (Signed)
I do not show that she has ever been prescribed Wellbutrin.  The closest medication in both alphabet and sound is Buspar.  Please clarify w/ pt

## 2019-03-24 NOTE — Telephone Encounter (Signed)
Pt called in asking for a call back pt can be reached at [phone]

## 2019-03-24 NOTE — Addendum Note (Signed)
Addended by: Davis Gourd on: 03/24/2019 02:59 PM   Modules accepted: Orders

## 2019-03-24 NOTE — Telephone Encounter (Signed)
Ok to refill Buspar on med list

## 2019-03-24 NOTE — Telephone Encounter (Signed)
Pt apologized. She did mean buspar.

## 2019-03-24 NOTE — Telephone Encounter (Signed)
Medication filled to pharmacy as requested.   

## 2019-03-24 NOTE — Telephone Encounter (Signed)
Called and spoke with pt. She had stopped the medication as soon as she had reaction 2 weeks ago. Pt would like to restart the wellbutrin at a "real low dose"  Best pharmacy is [facility] [address] in [place].

## 2019-03-25 DIAGNOSIS — Z981 Arthrodesis status: Secondary | ICD-10-CM | POA: Diagnosis not present

## 2019-03-25 DIAGNOSIS — M545 Low back pain: Secondary | ICD-10-CM | POA: Diagnosis not present

## 2019-03-25 DIAGNOSIS — Z23 Encounter for immunization: Secondary | ICD-10-CM | POA: Diagnosis not present

## 2019-03-28 DIAGNOSIS — Z79891 Long term (current) use of opiate analgesic: Secondary | ICD-10-CM | POA: Diagnosis not present

## 2019-03-28 DIAGNOSIS — M4693 Unspecified inflammatory spondylopathy, cervicothoracic region: Secondary | ICD-10-CM | POA: Diagnosis not present

## 2019-03-28 DIAGNOSIS — G894 Chronic pain syndrome: Secondary | ICD-10-CM | POA: Diagnosis not present

## 2019-03-28 DIAGNOSIS — M961 Postlaminectomy syndrome, not elsewhere classified: Secondary | ICD-10-CM | POA: Diagnosis not present

## 2019-04-12 ENCOUNTER — Other Ambulatory Visit: Payer: Self-pay

## 2019-04-12 ENCOUNTER — Emergency Department (INDEPENDENT_AMBULATORY_CARE_PROVIDER_SITE_OTHER): Payer: Medicare Other

## 2019-04-12 ENCOUNTER — Encounter: Payer: Self-pay | Admitting: Emergency Medicine

## 2019-04-12 ENCOUNTER — Emergency Department (INDEPENDENT_AMBULATORY_CARE_PROVIDER_SITE_OTHER)
Admission: EM | Admit: 2019-04-12 | Discharge: 2019-04-12 | Disposition: A | Discharge status: Home / Self Care | Payer: Medicare Other | Source: Home / Self Care | Attending: Emergency Medicine | Admitting: Emergency Medicine

## 2019-04-12 DIAGNOSIS — M546 Pain in thoracic spine: Secondary | ICD-10-CM

## 2019-04-12 DIAGNOSIS — M419 Scoliosis, unspecified: Secondary | ICD-10-CM | POA: Diagnosis not present

## 2019-04-12 DIAGNOSIS — R0789 Other chest pain: Secondary | ICD-10-CM

## 2019-04-12 DIAGNOSIS — K219 Gastro-esophageal reflux disease without esophagitis: Secondary | ICD-10-CM | POA: Diagnosis not present

## 2019-04-12 MED ORDER — FAMOTIDINE 20 MG PO TABS: 20.0000 mg | ORAL_TABLET | Freq: Every day | ORAL | 0 refills | Status: DC

## 2019-04-12 NOTE — ED Triage Notes (Signed)
Patient has had substernal pain and back pain which is exacerbation of chronic pain; it is burning sensation and wonders if it is her GERD.

## 2019-04-12 NOTE — ED Provider Notes (Signed)
[name]    CSN: [id number] Arrival date & time: 04/12/19  1033      History   Chief Complaint Chief Complaint  Patient presents with  . Chest Pain  . Back Pain    HPI [name] is a 73 y.o. female.   HPI Patient presents with 4-day history of chest and back pain.  The pain is localized in her thoracic spine area but also over the sternum.  She has not had any increasing shortness of breath.  The pain is in her back and extends into both shoulders.  It is present all of the time.  She has a history of previous back surgery and history of osteoporosis.  She has had a previous fusion.  She has a history of a significant cardiac murmur as well as atrial fibrillation currently on anticoagulation.  She has had no calf pain. Past Medical History:  Diagnosis Date  . Anxiety   . Arthritis    "maybe in my back" (03/31/2018)  . Benign paroxysmal positional vertigo 06/08/2013  . Fracture of multiple ribs 2015   "don't know from what; dx'd when I in hospital for 1st back OR" (03/31/2018)  . GERD (gastroesophageal reflux disease)   . Hair loss 04/12/2012  . Herpes   . History of blood transfusion    "twice; related to back OR" (03/31/2018)  . History of kidney stones   . Interstitial cystitis 11/06/2011  . 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 Osteopenia 02/18/2012  . Osteoporosis   . PAF (paroxysmal atrial fibrillation) ([place]) 2012  . Peripheral neuropathy 11/06/2011  . PMDD (premenstrual dysphoric disorder)   . Seasonal allergies   . Vaginal delivery    ONE NSVD  . 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 Left inguinal hernia 03/11/2019  . Visit for monitoring Tikosyn therapy 03/29/2018  . Osteoporosis 09/23/2017  . Fatigue 02/18/2017  . Physical exam 04/21/2016  . Anxiety and depression 02/13/2016  . Chronic pain 02/13/2016  . Vertigo 11/16/2015  . B12 deficiency 10/01/2015  . Hearing loss due to cerumen impaction  08/23/2015  . Foot pain, right 05/23/2015  . Hyperlipidemia 04/19/2015  . Protein-calorie malnutrition ([place]) 10/19/2014  . Fracture of multiple ribs 08/07/2014  . Paroxysmal atrial fibrillation ([facility])   . Tachycardia   . Anemia 08/06/2014  . Constipation 07/14/2014  . Lumbar back pain 05/19/2012  . Seasonal allergic rhinitis 11/06/2011  . Neuropathy 11/06/2011  . S/P cervical spinal fusion 11/06/2011  . GERD (gastroesophageal reflux disease) 11/06/2011    Past Surgical History:  Procedure Laterality Date  . ANTERIOR CERVICAL DECOMP/DISCECTOMY FUSION  ~ 2003  . BACK SURGERY    . BREAST SURGERY     BREAST BIOPSY--RIGHT BENIGN  . BUNIONECTOMY Bilateral   . COSMETIC SURGERY  2016   "back of my neck; related to earlier fusion"  . CYSTOSCOPY W/ STONE MANIPULATION  "several times"  . DILATION AND CURETTAGE OF UTERUS    . FOREHEAD RECONSTRUCTION Right    "removed bone protruding out of my forehead"  . HARDWARE REMOVAL  2016   "related to neck OR"  . INCONTINENCE SURGERY    . POSTERIOR CERVICAL FUSION/FORAMINOTOMY  ~ 2008; 2015  . SHOULDER ARTHROSCOPY W/ ROTATOR CUFF REPAIR Right 2012  . SPINAL FUSION  06/2014 - 2018 X ?7   "scoliosis; my entire back"  . TUBAL LIGATION    . VAGINAL HYSTERECTOMY  TVH    OB History    Gravida  1   Para  1   Term  1   Preterm      AB      Living  1     SAB      TAB      Ectopic      Multiple      Live Births  1            Home Medications    Prior to Admission medications   Medication Sig Start Date End Date Taking? Authorizing Provider  acetaminophen (TYLENOL) 500 MG tablet Take 1,000 mg by mouth every 6 (six) hours as needed for mild pain.     [provider]  busPIRone (BUSPAR) 5 MG tablet Take 1 tablet (5 mg total) by mouth 2 (two) times daily. 03/24/19   [name], MD  Calcium Carbonate Antacid (TUMS CHEWY BITES PO) Take 1 tablet by mouth daily as needed (reflux).     [provider]   Calcium Citrate-Vitamin D (CALCIUM + D PO) Take 1 tablet by mouth daily.    [provider]  CARTIA XT 120 MG 24 hr capsule TAKE 1 CAPSULE BY MOUTH ONCE DAILY IN THE EVENING ** NO FURTHER REFILLS UNTIL SEEN BY MD** 03/11/19   Camnitz, Ocie Doyne, MD  clonazePAM (KLONOPIN) 0.5 MG tablet Take 1 tablet (0.5 mg total) by mouth 2 (two) times daily as needed for anxiety. 03/11/19   [name], MD  cyclobenzaprine (FLEXERIL) 10 MG tablet Take 10 mg by mouth 3 (three) times daily as needed for muscle spasms.  12/10/17   [provider]  dofetilide (TIKOSYN) 250 MCG capsule Take 1 capsule (250 mcg total) by mouth 2 (two) times daily. 06/11/18   Sherran Needs, NP  famotidine (PEPCID) 20 MG tablet Take 1 tablet (20 mg total) by mouth daily. 04/12/19   Darlyne Russian, MD  furosemide (LASIX) 20 MG tablet Take 1 tablet (20 mg total) by mouth daily. 05/06/18   Evans Lance, MD  gabapentin (NEURONTIN) 100 MG capsule Take 100 mg by mouth at bedtime.    [provider]  Magnesium 200 MG TABS Take 1 tablet (200 mg total) by mouth daily. 07/26/18   Sherran Needs, NP  meclizine (ANTIVERT) 25 MG tablet Take 1 tablet (25 mg total) by mouth 3 (three) times daily as needed for dizziness. 07/09/17   [name], MD  metoprolol tartrate (LOPRESSOR) 25 MG tablet Take 1 tablet (25 mg total) by mouth 2 (two) times daily. **NO REFILLS UNTIL SEEN BY MD"" 01/11/19   Camnitz, Ocie Doyne, MD  Nystatin POWD Apply small amount to affected area. 12/08/17   Princess Bruins, MD  oxyCODONE (ROXICODONE) 5 MG/5ML solution Take 6.5 mg by mouth every 4 (four) hours as needed for moderate pain.  10/31/15   [provider]  polyethylene glycol (MIRALAX / GLYCOLAX) packet Take 17 g by mouth daily as needed for mild constipation.    [provider]  potassium chloride (K-DUR) 10 MEQ tablet Take 1 tablet (10 mEq total) by mouth daily. 11/23/18   Evans Lance, MD  rivaroxaban (XARELTO) 20  MG TABS tablet Take 1 tablet (20 mg total) by mouth daily with supper. 01/11/19   Camnitz, Will Hassell Done, MD  valACYclovir (VALTREX) 1000 MG tablet TK 2 TS PO TONIGHT AND 2 TS TOMORROW MORNING UTD 06/09/18   [provider]  Vilazodone HCl (VIIBRYD)  10 MG TABS Take 1/2 tab PO once daily for one week, then may increase to a whole tab daily. 03/06/19   Kandra Nicolas, MD    Family History Family History  Problem Relation Age of Onset  . Diabetes Father   . Hyperlipidemia Sister   . Heart disease Sister   . Stroke Sister   . Diabetes Brother   . Hyperlipidemia Sister   . Heart disease Sister   . Arthritis Mother   . Heart disease Mother   . Uterine cancer Mother   . Diabetes Brother   . Heart Problems Brother     Social History Social History   Tobacco Use  . Smoking status: Former Smoker    Packs/day: 0.10    Years: 14.00    Pack years: 1.40    Types: Cigarettes  . Smokeless tobacco: Never Used  . Tobacco comment: 03/31/2018 "quit ~ 1980; someday smoker when I did smoke; never addicted"  Substance Use Topics  . Alcohol use: Never    Frequency: Never  . Drug use: Yes    Types: Oxycodone    Comment: 03/31/2018 "for chronic neck and back pain"     Allergies   Buprenorphine, Contrast media [iodinated diagnostic agents], Cymbalta [duloxetine hcl], Erythromycin, Hydrocodone, Hydromorphone, Iodine, Latex, Levofloxacin, Metrizamide, Nsaids, Nucynta  [tapentadol hcl], Other, Oxycodone, Pentazocine, Septra [bactrim], Sulfa antibiotics, Sulfasalazine, Adhesive [tape], and Morphine   Review of Systems Review of Systems  Constitutional: Negative.   HENT: Negative.   Respiratory: Negative for cough and shortness of breath.   Cardiovascular: Positive for chest pain. Negative for leg swelling.       History of atrial fib  Gastrointestinal: Negative.   Musculoskeletal:       She has significant musculoskeletal pain.  She is on chronic pain medications.     Physical Exam  Triage Vital Signs ED Triage Vitals  Enc Vitals Group     BP 04/12/19 1040 (!) 156/90     Pulse Rate 04/12/19 1040 85     Resp 04/12/19 1040 18     Temp 04/12/19 1040 97.8 F (36.6 C)     Temp Source 04/12/19 1040 Oral     SpO2 04/12/19 1040 100 %     Weight 04/12/19 1057 121 lb (54.9 kg)     Height 04/12/19 1057 5\' 6"  (1.676 m)     Head Circumference --      Peak Flow --      Pain Score 04/12/19 1055 6     Pain Loc --      Pain Edu? --      Excl. in [place]? --    No data found.  Updated Vital Signs BP (!) 156/90 (BP Location: Right Arm)   Pulse 85   Temp 97.8 F (36.6 C) (Oral)   Resp 18   Ht 5\' 6"  (1.676 m)   Wt 54.9 kg   SpO2 100%   BMI 19.53 kg/m   Visual Acuity Right Eye Distance:   Left Eye Distance:   Bilateral Distance:    Right Eye Near:   Left Eye Near:    Bilateral Near:     Physical Exam Constitutional:      Comments: Frail woman who is not in any distress.  HENT:     Head: Normocephalic.  Neck:     Comments: There is a webbed neck deformity. Cardiovascular:     Comments: Cardiac reveals a regular rate and rhythm.  There is a 3/6 systolic  murmur heard at the apex of the heart also heard posteriorly. Pulmonary:     Effort: Pulmonary effort is normal.     Breath sounds: Normal breath sounds.  Chest:     Chest wall: Tenderness present.  Abdominal:     Palpations: Abdomen is soft.  Skin:    Comments: Dorsalis pedis pulses are 2+      UC Treatments / Results  Labs (all labs ordered are listed, but only abnormal results are displayed) Labs Reviewed - No data to display  EKG Sinus arrhythmia.  No A. fib detected.  Minor ST-T changes no significant change from previous EKG.  Radiology Dg Chest 2 View  Result Date: 04/12/2019 CLINICAL DATA:  Pt with severe Periscapular back pain and hx A-fib. Hx scoliosis and surg with hardware. Ex-smokerPeriscapular back pain. History atrial fibrillation EXAM: CHEST - 2 VIEW COMPARISON:  Radiograph 12 17 19   FINDINGS: Posterior thoracolumbar fusion. No acute loss vertebral body height or disc height. Normal cardiac silhouette. Mild apical nodularity is favored benign. No change from prior. No pleural fluid. No pneumothorax. IMPRESSION: 1. No acute cardiopulmonary findings. 2. Posterior thoracolumbar fusion. Electronically Signed   By: Suzy Bouchard M.D.   On: 04/12/2019 12:02   Dg Thoracic Spine 2 View  Result Date: 04/12/2019 CLINICAL DATA:  Severe back pain. History of prior surgery for scoliosis. EXAM: THORACIC SPINE 2 VIEWS COMPARISON:  PA and lateral chest 06/22/2018. FINDINGS: The patient is status post fusion throughout cervical spine into the lumbar spine below the inferior margin of the films. Hardware is intact without complicating feature. No fracture or malalignment is seen. No focal bone lesion is identified. Paraspinous structures demonstrate remote right rib fractures. IMPRESSION: No acute abnormality. Status post fusion throughout the cervical spine, thoracic spine and into the lumbar spine. Electronically Signed   By: Inge Rise M.D.   On: 04/12/2019 12:03    Procedures Procedures (including critical care time)  Medications Ordered in UC Medications - No data to display  Initial Impression / Assessment and Plan / UC Course  I have reviewed the triage vital signs and the nursing notes. I suspect this is musculoskeletal pain.  EKG is unchanged from previous.  She is in sinus rhythm.  She does have poor R wave progression across the precordium.  There is a mild sinus Arrhythmia but otherwise unremarkable.  No significant change from previous.  Exam is more consistent with a musculoskeletal pain.  We will proceed with chest x-ray as well as thoracic spine films.  These films did not reveal any acute abnormality.  Patient has a total spinal fusion involving cervical thoracic and lumbar spine.  We will add Pepcid 20 mg 1 a day to see if that might help some.  She will check with the  pharmacist regarding any potential interactions with Tikosyn. Pertinent labs & imaging results that were available during my care of the patient were reviewed by me and considered in my medical decision making (see chart for details).      Final Clinical Impressions(s) / UC Diagnoses   Final diagnoses:  Chest wall pain  Gastroesophageal reflux disease, unspecified whether esophagitis present     Discharge Instructions     Take your current medications. Add Pepcid 1 a day. Follow-up with your family doctor and cardiologist as already scheduled.    ED Prescriptions    Medication Sig Dispense Auth. Provider   famotidine (PEPCID) 20 MG tablet Take 1 tablet (20 mg total) by mouth daily. [place]  tablet Darlyne Russian, MD     PDMP not reviewed this encounter.   Darlyne Russian, MD 04/12/19 [phone]

## 2019-04-12 NOTE — Discharge Instructions (Signed)
Take your current medications. Add Pepcid 1 a day. Follow-up with your family doctor and cardiologist as already scheduled.

## 2019-04-14 ENCOUNTER — Encounter: Payer: Self-pay | Admitting: Gynecology

## 2019-04-25 ENCOUNTER — Other Ambulatory Visit: Payer: Self-pay

## 2019-04-25 ENCOUNTER — Ambulatory Visit (INDEPENDENT_AMBULATORY_CARE_PROVIDER_SITE_OTHER): Payer: Medicare Other | Admitting: Cardiology

## 2019-04-25 ENCOUNTER — Encounter: Payer: Self-pay | Admitting: Cardiology

## 2019-04-25 VITALS — BP 112/68 | HR 86 | Ht 66.0 in | Wt 124.0 lb

## 2019-04-25 DIAGNOSIS — I340 Nonrheumatic mitral (valve) insufficiency: Secondary | ICD-10-CM

## 2019-04-25 DIAGNOSIS — I480 Paroxysmal atrial fibrillation: Secondary | ICD-10-CM

## 2019-04-25 MED ORDER — DILTIAZEM HCL ER COATED BEADS 180 MG PO CP24: 180.0000 mg | ORAL_CAPSULE | Freq: Every day | ORAL | 3 refills | Status: DC

## 2019-04-25 NOTE — Patient Instructions (Addendum)
Medication Instructions:  Your physician has recommended you make the following change in your medication:  1. STOP Metoprolol Tartrate (Lopressor) 2. INCREASE Diltiazem to 180 mg once daily  * If you need a refill on your cardiac medications before your next appointment, please call your pharmacy.   Labwork: None ordered  Testing/Procedures: Your physician has requested that you have an echocardiogram in 3 months - before your follow up appointment with [name]. Echocardiography is a painless test that uses sound waves to create images of your heart. It provides your doctor with information about the size and shape of your heart and how well your heart's chambers and valves are working. This procedure takes approximately one hour. There are no restrictions for this procedure.  Follow-Up: You are scheduled for 3 month follow up on 08/01/2019 @ 11:45 am with [name].    Thank you for choosing [facility]!!   Trinidad Curet, RN [phone]

## 2019-04-25 NOTE — Progress Notes (Signed)
Electrophysiology Office Note   Date:  04/25/2019   ID:  Regina, [name] 03/23/1946, MRN [id number]  PCP:  [name], MD  Cardiologist:  Lovena Le Primary Electrophysiologist: Gaye Alken, MD    No chief complaint on file.    History of Present Illness: [name] is a 73 y.o. female who is being seen today for the evaluation of atrial fibrillation at the request of Tabori, [name], MD. Presenting today for electrophysiology evaluation.  She has severe panic and anxiety disorder.  She is currently on dofetilide.  She is have been having breakthrough episodes of atrial fibrillation.  Today, denies symptoms of palpitations, chest pain, shortness of breath, orthopnea, PND, lower extremity edema, claudication, dizziness, presyncope, syncope, bleeding, or neurologic sequela. The patient is tolerating medications without difficulties.  She gets symptoms of atrial fibrillation when she gets fatigued or anxious.  She is also having fatigue on a daily basis aside from that.  She feels that this may be due to medications.   Past Medical History:  Diagnosis Date  . Anxiety   . Arthritis    "maybe in my back" (03/31/2018)  . Benign paroxysmal positional vertigo 06/08/2013  . Fracture of multiple ribs 2015   "don't know from what; dx'd when I in hospital for 1st back OR" (03/31/2018)  . GERD (gastroesophageal reflux disease)   . Hair loss 04/12/2012  . Herpes   . History of blood transfusion    "twice; related to back OR" (03/31/2018)  . History of kidney stones   . Interstitial cystitis 11/06/2011  . 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 Osteopenia 02/18/2012  . Osteoporosis   . PAF (paroxysmal atrial fibrillation) ([place]) 2012  . Peripheral neuropathy 11/06/2011  . PMDD (premenstrual dysphoric disorder)   . Seasonal allergies   . Vaginal delivery    ONE NSVD  . 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 ANTERIOR  CERVICAL DECOMP/DISCECTOMY FUSION  ~ 2003  . BACK SURGERY    . BREAST SURGERY     BREAST BIOPSY--RIGHT BENIGN  . BUNIONECTOMY Bilateral   . COSMETIC SURGERY  2016   "back of my neck; related to earlier fusion"  . CYSTOSCOPY W/ STONE MANIPULATION  "several times"  . DILATION AND CURETTAGE OF UTERUS    . FOREHEAD RECONSTRUCTION Right    "removed bone protruding out of my forehead"  . HARDWARE REMOVAL  2016   "related to neck OR"  . INCONTINENCE SURGERY    . POSTERIOR CERVICAL FUSION/FORAMINOTOMY  ~ 2008; 2015  . SHOULDER ARTHROSCOPY W/ ROTATOR CUFF REPAIR Right 2012  . SPINAL FUSION  06/2014 - 2018 X ?7   "scoliosis; my entire back"  . TUBAL LIGATION    . VAGINAL HYSTERECTOMY     TVH     Current Outpatient Medications  Medication Sig Dispense Refill  . acetaminophen (TYLENOL) 500 MG tablet Take 1,000 mg by mouth every 6 (six) hours as needed for mild pain.     . busPIRone (BUSPAR) 5 MG tablet Take 1 tablet (5 mg total) by mouth 2 (two) times daily. 60 tablet 3  . Calcium Carbonate Antacid (TUMS CHEWY BITES PO) Take 1 tablet by mouth daily as needed (reflux).     . Calcium Citrate-Vitamin D (CALCIUM + D PO) Take 1 tablet by mouth daily.    . clonazePAM (KLONOPIN) 0.5 MG tablet Take 1 tablet (0.5 mg total) by mouth 2 (  two) times daily as needed for anxiety. 60 tablet 1  . cyclobenzaprine (FLEXERIL) 10 MG tablet Take 10 mg by mouth 3 (three) times daily as needed for muscle spasms.   5  . dofetilide (TIKOSYN) 250 MCG capsule Take 1 capsule (250 mcg total) by mouth 2 (two) times daily. 180 capsule 3  . famotidine (PEPCID) 20 MG tablet Take 1 tablet (20 mg total) by mouth daily. 30 tablet 0  . furosemide (LASIX) 20 MG tablet Take 1 tablet (20 mg total) by mouth daily. 90 tablet 3  . gabapentin (NEURONTIN) 100 MG capsule Take 100 mg by mouth at bedtime.    . Magnesium 200 MG TABS Take 1 tablet (200 mg total) by mouth daily. 30 each   . meclizine (ANTIVERT) 25 MG tablet Take 1 tablet (25  mg total) by mouth 3 (three) times daily as needed for dizziness. 45 tablet 0  . Nystatin POWD Apply small amount to affected area. 1 Bottle 3  . oxyCODONE (ROXICODONE) 5 MG/5ML solution Take 6.5 mg by mouth every 4 (four) hours as needed for moderate pain.     . polyethylene glycol (MIRALAX / GLYCOLAX) packet Take 17 g by mouth daily as needed for mild constipation.    . potassium chloride (K-DUR) 10 MEQ tablet Take 1 tablet (10 mEq total) by mouth daily. 90 tablet 3  . rivaroxaban (XARELTO) 20 MG TABS tablet Take 1 tablet (20 mg total) by mouth daily with supper. 30 tablet 3  . valACYclovir (VALTREX) 1000 MG tablet TK 2 TS PO TONIGHT AND 2 TS TOMORROW MORNING UTD  0  . [START ON 04/26/2019] diltiazem (CARDIZEM CD) 180 MG 24 hr capsule Take 1 capsule (180 mg total) by mouth daily. 30 capsule 3   No current facility-administered medications for this visit.     Allergies:   Buprenorphine, Contrast media [iodinated diagnostic agents], Cymbalta [duloxetine hcl], Erythromycin, Hydrocodone, Hydromorphone, Iodine, Latex, Levofloxacin, Metrizamide, Nsaids, Nucynta  [tapentadol hcl], Other, Oxycodone, Pentazocine, Septra [bactrim], Sulfa antibiotics, Sulfasalazine, Adhesive [tape], and Morphine   Social History:  The patient  reports that she has quit smoking. Her smoking use included cigarettes. She has a 1.40 pack-year smoking history. She has never used smokeless tobacco. She reports current drug use. Drug: Oxycodone. She reports that she does not drink alcohol.   Family History:  The patient's family history includes Arthritis in her mother; Diabetes in her brother, brother, and father; Heart Problems in her brother; Heart disease in her mother, sister, and sister; Hyperlipidemia in her sister and sister; Stroke in her sister; Uterine cancer in her mother.    ROS:  Please see the history of present illness.   Otherwise, review of systems is positive for none.   All other systems are reviewed and  negative.   PHYSICAL EXAM: VS:  BP 112/68   Pulse 86   Ht 5\' 6"  (1.676 m)   Wt 124 lb (56.2 kg)   SpO2 96%   BMI 20.01 kg/m  , BMI Body mass index is 20.01 kg/m. GEN: Well nourished, well developed, in no acute distress  HEENT: normal  Neck: no JVD, carotid bruits, or masses Cardiac: RRR; 2 out of 6 systolic murmur at the apex Respiratory:  clear to auscultation bilaterally, normal work of breathing GI: soft, nontender, nondistended, + BS MS: no deformity or atrophy  Skin: warm and dry Neuro:  Strength and sensation are intact Psych: euthymic mood, full affect  EKG:  EKG is not ordered today. Personal review  of the ekg ordered 04/12/19 shows sinus rhythm, rate 79, QTc 465 ms  Recent Labs: 08/25/2018: Hemoglobin 13.2; Platelets 207.0; TSH 2.49 12/06/2018: BUN 23; Creatinine, Ser 0.63; Magnesium 2.0; Potassium 4.1; Sodium 141    Lipid Panel     Component Value Date/Time   CHOL 237 (H) 09/23/2017 1420   TRIG 101.0 09/23/2017 1420   HDL 100.90 09/23/2017 1420   CHOLHDL 2 09/23/2017 1420   VLDL 20.2 09/23/2017 1420   LDLCALC 116 (H) 09/23/2017 1420   LDLDIRECT 136.5 06/08/2013 1049     Wt Readings from Last 3 Encounters:  04/25/19 124 lb (56.2 kg)  04/12/19 121 lb (54.9 kg)  03/11/19 120 lb (54.4 kg)      Other studies Reviewed: Additional studies/ records that were reviewed today include: TTE 09/14/2017 Review of the above records today demonstrates: Ejection fraction [id number] Grade 2 diastolic dysfunction Noted, grade 2 mitral regurgitation.  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  Eccentric wall impinging MR jet color flow area. Mild to moderate tricuspid regurgitation Normal-sized left atrium   ASSESSMENT AND PLAN:  1.  Paroxysmal atrial fibrillation: Currently on dofetilide and Xarelto.  She has had minimal atrial fibrillation since last being seen.  She has having some weakness and fatigue.  Due to that, we Khylah Kendra stop her metoprolol and increase diltiazem  to 180 mg.  She Lajarvis Italiano continue to consider atrial fibrillation ablation.  This patients CHA2DS2-VASc Score and unadjusted Ischemic Stroke Rate (% per year) is equal to 3.2 % stroke rate/year from a score of 3  Above score calculated as 1 point each if present [CHF, HTN, DM, Vascular=MI/PAD/Aortic Plaque, Age if 65-74, or Female] Above score calculated as 2 points each if present [Age > 75, or Stroke/TIA/TE]   2.  Hypertension: Currently well controlled  3.  Moderate mitral regurgitation: Merlinda Wrubel likely need a repeat echo in the next 6 months for reevaluation.  She would likely be a candidate for minimally invasive mitral valve surgery along with maze if she requires an operation.  Current medicines are reviewed at length with the patient today.   The patient does not have concerns regarding her medicines.  The following changes were made today: Stop metoprolol, increase diltiazem  Labs/ tests ordered today include:  Orders Placed This Encounter  Procedures  . ECHOCARDIOGRAM COMPLETE     Disposition:   FU with Verita Kuroda 3 months  Signed, Mikaeel Petrow Meredith Leeds, MD  04/25/2019 1:40 PM     [address] [place] [place] [address] [phone] (office) [phone] (fax)

## 2019-04-26 ENCOUNTER — Telehealth: Payer: Self-pay | Admitting: Physician Assistant

## 2019-04-26 NOTE — Telephone Encounter (Signed)
Pt called because she took her first dose of Cardizem CD 180 mg qd this pm. Normally takes it at bedtime.   After she took the pill, she felt a little light-headed. Kind of a head rush. No presyncope or syncope. No palpitations.   Wants to know if this is normal for Cardizem.  Advised her that it is possible to have various side effects when you increase the dose of medication.  I offered her the option of going back to previous doses and contacting [name], or continuing the current dose of Cardizem without metoprolol to see if her body gets used to it in a week or 2.  She greatly prefers to continue the higher dose of Cardizem and stay off the metoprolol.  She understands that after she takes the Cardizem she needs to make sure that she can rest, and is not going to be going anywhere until her body gets used to it.  If she is still having significant side effects in a week or 2, recontact [place].  She is in agreement with this as a plan of care.  Rosaria Ferries, PA-C 04/26/2019 7:59 PM [facility] [phone]

## 2019-04-27 ENCOUNTER — Telehealth: Payer: Self-pay | Admitting: Cardiology

## 2019-04-27 NOTE — Telephone Encounter (Signed)
Patient has question about medication change that was made.

## 2019-04-27 NOTE — Telephone Encounter (Signed)
I spoke with pt who reports since placing call she has spoken with [facility] clinic.  She has no additional questions and will continue taking increased dose of Cardizem.

## 2019-04-27 NOTE — Telephone Encounter (Signed)
Patient called to AF clinic asking questions regarding recent medication changes. Worried her HR is in the low 90s since the change from metoprolol to solely cardizem daily. Pt started new regimen yesterday - had some dizziness/head rushing after the cardizem 180mg  dose last night. Pt will try new regimen for a few more days will move the cardizem dose to bedtime instead of supper time. She will call back if she decides to go back on metoprolol maybe at the 12.5mg  dose. Encouraged pt to minimize vital checks to just twice a day instead of frequently unless change in symptoms as this seems to increase her anxiety level/HRs. Pt in agreement. Will let Sherri, RN aware of patients concerns as well.

## 2019-05-02 ENCOUNTER — Telehealth: Payer: Self-pay

## 2019-05-02 NOTE — Telephone Encounter (Signed)
Pt called and asked for a refill on Pepcid, explained that Dr Everlene Farrier is not scheduled for several weeks, to follow up with PCP.  Pt has an appointment with PCP in a couple of weeks.  Offered to get the medication OTC, but wanted a script for it.  Will follow up with PCP.

## 2019-05-05 ENCOUNTER — Encounter: Payer: Medicare Other | Admitting: Family Medicine

## 2019-05-05 ENCOUNTER — Telehealth: Payer: Self-pay | Admitting: Cardiology

## 2019-05-05 MED ORDER — DILTIAZEM HCL ER COATED BEADS 240 MG PO CP24: 240.0000 mg | ORAL_CAPSULE | Freq: Every day | ORAL | 3 refills | Status: DC

## 2019-05-05 MED ORDER — METOPROLOL TARTRATE 25 MG PO TABS: 25.0000 mg | ORAL_TABLET | Freq: Two times a day (BID) | ORAL | 3 refills | Status: DC | PRN

## 2019-05-05 NOTE — Telephone Encounter (Signed)
Pt reports she is feeling much improved since stopping the Metoprolol (Lopressor), "I have had more energy this past week than I have in a long time".  She does however report she is still having issues w/ elevated HR.  The highest is 105, but still made her feel bad. BP also elevated at 168/74. She is taking her Diltiazem in the evening (currently taking Eliquis at 6pm, Diltaizem at [place] at 8pm).  Advised that she may take it with her Tikosyn around 8pm or move it to bedtime if needed.  Reviewed w/ Camnitz. Advised to increase Diltiazem to 240 mg once daily & take Lopressor 12.5-25 mg daily as needed for elevated HRs. Patient verbalized understanding and agreeable to plan.

## 2019-05-05 NOTE — Telephone Encounter (Signed)
Patient c/o Palpitations:  High priority if patient c/o lightheadedness, shortness of breath, or chest pain  1) How long have you had palpitations/irregular HR/ Afib? Are you having the symptoms now? Started last night, not in Aib now- last night she had to call the doctor on call, he told her to call and talk to the nurse this morning  2) Are you currently experiencing lightheadedness, SOB or CP? no  3) Do you have a history of afib (atrial fibrillation) or irregular heart rhythm? yes*  4) Have you checked your BP or HR? (document readings if available):   5) Are you experiencing any other symptoms? no

## 2019-05-07 ENCOUNTER — Telehealth: Payer: Self-pay | Admitting: Student

## 2019-05-07 NOTE — Telephone Encounter (Signed)
   Patient called Answering Service with question about medications. Home Diltiazem was recently increased from 120mg  daily to 240mg  daily. Patient has trouble swallowing large pills and states she had difficulty swallowing the 240mg  capsule yesterday. She still has 120mg  capsules at home and is wondering if she can take 2 of these instead for same total dose of 240mg . Assured patient that that is fine to do. Patient voiced understanding and thanked me for calling.

## 2019-05-12 ENCOUNTER — Encounter: Payer: Medicare Other | Admitting: Family Medicine

## 2019-05-13 DIAGNOSIS — H04123 Dry eye syndrome of bilateral lacrimal glands: Secondary | ICD-10-CM | POA: Diagnosis not present

## 2019-05-13 DIAGNOSIS — H04223 Epiphora due to insufficient drainage, bilateral lacrimal glands: Secondary | ICD-10-CM | POA: Diagnosis not present

## 2019-05-13 DIAGNOSIS — H019 Unspecified inflammation of eyelid: Secondary | ICD-10-CM | POA: Diagnosis not present

## 2019-05-13 DIAGNOSIS — H04553 Acquired stenosis of bilateral nasolacrimal duct: Secondary | ICD-10-CM | POA: Diagnosis not present

## 2019-05-13 DIAGNOSIS — H0235 Blepharochalasis left lower eyelid: Secondary | ICD-10-CM | POA: Diagnosis not present

## 2019-05-13 DIAGNOSIS — H02423 Myogenic ptosis of bilateral eyelids: Secondary | ICD-10-CM | POA: Diagnosis not present

## 2019-05-13 DIAGNOSIS — H0232 Blepharochalasis right lower eyelid: Secondary | ICD-10-CM | POA: Diagnosis not present

## 2019-05-17 ENCOUNTER — Telehealth: Payer: Self-pay | Admitting: Cardiology

## 2019-05-17 ENCOUNTER — Other Ambulatory Visit (HOSPITAL_COMMUNITY): Payer: Self-pay | Admitting: *Deleted

## 2019-05-17 MED ORDER — METOPROLOL SUCCINATE ER 25 MG PO TB24: 12.5000 mg | ORAL_TABLET | Freq: Every day | ORAL | 3 refills | Status: DC

## 2019-05-17 NOTE — Telephone Encounter (Signed)
Pt reports having to take Lopressor once daily PRN for the past few afternoons d/t AFib.  She does report some recent stress and not sleeping well.  Advised that this may be cause of recent AFib episodes. Advised to continue to monitor for now and call office if she begins to notice more frequent occurrences of AFib that is not controlled and she is having to take Lopressor PRN more often.  Aware if more frequent then need to re-address medications. Patient verbalized understanding and agreeable to plan.

## 2019-05-17 NOTE — Telephone Encounter (Signed)
Patient called in just very concerned regarding her increase in AF burden. Seems to be pretty much every afternoon she starts feeling the palpitations and this makes her anxiety high/bp elevate.  Discussed with [name] - will try adding metoprolol succinate 12.5mg  once a day opposite of cardizem dose. Pt will try this and see if improvement without added fatigue. Rx sent to pharmacy of choice.

## 2019-05-17 NOTE — Telephone Encounter (Signed)
New message   Pt c/o medication issue:  1. Name of Medication:metoprolol tartrate (LOPRESSOR) 25 MG tablet   diltiazem (CARDIZEM CD) 240 MG 24 hr capsule  2. How are you currently taking this medication (dosage and times per day)? As written  3. Are you having a reaction (difficulty breathing--STAT)?no   4. What is your medication issue? Patient has questions about this medication. Please call to discuss.

## 2019-05-18 ENCOUNTER — Other Ambulatory Visit: Payer: Self-pay

## 2019-05-18 ENCOUNTER — Encounter (HOSPITAL_COMMUNITY): Payer: Self-pay | Admitting: Nurse Practitioner

## 2019-05-18 ENCOUNTER — Ambulatory Visit (HOSPITAL_COMMUNITY)
Admission: RE | Admit: 2019-05-18 | Discharge: 2019-05-18 | Disposition: A | Discharge status: Home / Self Care | Payer: Medicare Other | Source: Ambulatory Visit | Attending: Nurse Practitioner | Admitting: Nurse Practitioner

## 2019-05-18 VITALS — BP 146/56 | HR 87 | Ht 66.0 in | Wt 125.0 lb

## 2019-05-18 DIAGNOSIS — Z888 Allergy status to other drugs, medicaments and biological substances status: Secondary | ICD-10-CM | POA: Diagnosis not present

## 2019-05-18 DIAGNOSIS — Z91040 Latex allergy status: Secondary | ICD-10-CM | POA: Insufficient documentation

## 2019-05-18 DIAGNOSIS — Z79899 Other long term (current) drug therapy: Secondary | ICD-10-CM | POA: Diagnosis not present

## 2019-05-18 DIAGNOSIS — M5412 Radiculopathy, cervical region: Secondary | ICD-10-CM | POA: Diagnosis not present

## 2019-05-18 DIAGNOSIS — Z87442 Personal history of urinary calculi: Secondary | ICD-10-CM | POA: Diagnosis not present

## 2019-05-18 DIAGNOSIS — G894 Chronic pain syndrome: Secondary | ICD-10-CM | POA: Diagnosis not present

## 2019-05-18 DIAGNOSIS — Z823 Family history of stroke: Secondary | ICD-10-CM | POA: Diagnosis not present

## 2019-05-18 DIAGNOSIS — Z981 Arthrodesis status: Secondary | ICD-10-CM | POA: Diagnosis not present

## 2019-05-18 DIAGNOSIS — K219 Gastro-esophageal reflux disease without esophagitis: Secondary | ICD-10-CM | POA: Insufficient documentation

## 2019-05-18 DIAGNOSIS — F419 Anxiety disorder, unspecified: Secondary | ICD-10-CM | POA: Diagnosis not present

## 2019-05-18 DIAGNOSIS — Z7901 Long term (current) use of anticoagulants: Secondary | ICD-10-CM | POA: Diagnosis not present

## 2019-05-18 DIAGNOSIS — Z8249 Family history of ischemic heart disease and other diseases of the circulatory system: Secondary | ICD-10-CM | POA: Insufficient documentation

## 2019-05-18 DIAGNOSIS — Z91041 Radiographic dye allergy status: Secondary | ICD-10-CM | POA: Diagnosis not present

## 2019-05-18 DIAGNOSIS — Z87891 Personal history of nicotine dependence: Secondary | ICD-10-CM | POA: Diagnosis not present

## 2019-05-18 DIAGNOSIS — D6869 Other thrombophilia: Secondary | ICD-10-CM | POA: Diagnosis not present

## 2019-05-18 DIAGNOSIS — Z885 Allergy status to narcotic agent status: Secondary | ICD-10-CM | POA: Insufficient documentation

## 2019-05-18 DIAGNOSIS — M4693 Unspecified inflammatory spondylopathy, cervicothoracic region: Secondary | ICD-10-CM | POA: Diagnosis not present

## 2019-05-18 DIAGNOSIS — G629 Polyneuropathy, unspecified: Secondary | ICD-10-CM | POA: Diagnosis not present

## 2019-05-18 DIAGNOSIS — R002 Palpitations: Secondary | ICD-10-CM | POA: Insufficient documentation

## 2019-05-18 DIAGNOSIS — I480 Paroxysmal atrial fibrillation: Secondary | ICD-10-CM

## 2019-05-18 DIAGNOSIS — Z85820 Personal history of malignant melanoma of skin: Secondary | ICD-10-CM | POA: Diagnosis not present

## 2019-05-18 DIAGNOSIS — Z882 Allergy status to sulfonamides status: Secondary | ICD-10-CM | POA: Diagnosis not present

## 2019-05-18 DIAGNOSIS — M961 Postlaminectomy syndrome, not elsewhere classified: Secondary | ICD-10-CM | POA: Diagnosis not present

## 2019-05-18 DIAGNOSIS — Z79891 Long term (current) use of opiate analgesic: Secondary | ICD-10-CM | POA: Diagnosis not present

## 2019-05-18 DIAGNOSIS — Z833 Family history of diabetes mellitus: Secondary | ICD-10-CM | POA: Diagnosis not present

## 2019-05-18 DIAGNOSIS — Z881 Allergy status to other antibiotic agents status: Secondary | ICD-10-CM | POA: Diagnosis not present

## 2019-05-18 LAB — BASIC METABOLIC PANEL
Anion gap: 13 (ref 5–15)
BUN: 10 mg/dL (ref 8–23)
CO2: 25 mmol/L (ref 22–32)
Calcium: 9.1 mg/dL (ref 8.9–10.3)
Chloride: 103 mmol/L (ref 98–111)
Creatinine, Ser: 0.65 mg/dL (ref 0.44–1.00)
GFR calc Af Amer: >60 mL/min (ref >60)
GFR calc non Af Amer: >60 mL/min (ref >60)
Glucose, Bld: 105 mg/dL — ABNORMAL HIGH (ref 70–99)
Potassium: 4 mmol/L (ref 3.5–5.1)
Sodium: 141 mmol/L (ref 135–145)

## 2019-05-18 LAB — MAGNESIUM: Magnesium: 2.2 mg/dL (ref 1.7–2.4)

## 2019-05-18 NOTE — Progress Notes (Signed)
Primary Care Physician: [name], MD Referring Physician: Dr. Jordan Likes [name] is a 73 y.o. female with a h/o paroxysmal afib that is in the afib clinic for reports of increased HR in the 90's since she was changed from  metoprolol for fatigue to daily Cardizem per [name].She dose feel less fatigue off bid metoprolol tartrae. Pt is very anxious and will check her BP/HR several times a day. She  is in SR today. I did just add 12.5 of metoprolol succinate yesterday as pt takes cardizem in the evenings and felt she needed a little extra something to tide her over. She has a lot of chronic pain  with her hardware in her neck after surgery to treat her scoliosis. She continues on dofetilide 250 mcg bid and xarelto 20 mg daily for a CHA2DS2VASc score of 3.   Today, she denies symptoms of   chest pain, shortness of breath, orthopnea, PND, lower extremity edema, dizziness, presyncope, syncope, or neurologic sequela. The patient is tolerating medications without difficulties and is otherwise without complaint today.   Past Medical History:  Diagnosis Date  . Anxiety   . Arthritis    "maybe in my back" (03/31/2018)  . Benign paroxysmal positional vertigo 06/08/2013  . Fracture of multiple ribs 2015   "don't know from what; dx'd when I in hospital for 1st back OR" (03/31/2018)  . GERD (gastroesophageal reflux disease)   . Hair loss 04/12/2012  . Herpes   . History of blood transfusion    "twice; related to back OR" (03/31/2018)  . History of kidney stones   . Interstitial cystitis 11/06/2011  . 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 Osteopenia 02/18/2012  . Osteoporosis   . PAF (paroxysmal atrial fibrillation) ([place]) 2012  . Peripheral neuropathy 11/06/2011  . PMDD (premenstrual dysphoric disorder)   . Seasonal allergies   . Vaginal delivery    ONE NSVD  . 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 ANTERIOR CERVICAL DECOMP/DISCECTOMY  FUSION  ~ 2003  . BACK SURGERY    . BREAST SURGERY     BREAST BIOPSY--RIGHT BENIGN  . BUNIONECTOMY Bilateral   . COSMETIC SURGERY  2016   "back of my neck; related to earlier fusion"  . CYSTOSCOPY W/ STONE MANIPULATION  "several times"  . DILATION AND CURETTAGE OF UTERUS    . FOREHEAD RECONSTRUCTION Right    "removed bone protruding out of my forehead"  . HARDWARE REMOVAL  2016   "related to neck OR"  . INCONTINENCE SURGERY    . POSTERIOR CERVICAL FUSION/FORAMINOTOMY  ~ 2008; 2015  . SHOULDER ARTHROSCOPY W/ ROTATOR CUFF REPAIR Right 2012  . SPINAL FUSION  06/2014 - 2018 X ?7   "scoliosis; my entire back"  . TUBAL LIGATION    . VAGINAL HYSTERECTOMY     [facility]    Current Outpatient Medications  Medication Sig Dispense Refill  . acetaminophen (TYLENOL) 500 MG tablet Take 1,000 mg by mouth every 6 (six) hours as needed for mild pain.     . busPIRone (BUSPAR) 5 MG tablet Take 1 tablet (5 mg total) by mouth 2 (two) times daily. 60 tablet 3  . Calcium Carbonate Antacid (TUMS CHEWY BITES PO) Take 1 tablet by mouth daily as needed (reflux).     . Calcium Citrate-Vitamin D (CALCIUM + D PO) Take 1 tablet by mouth daily.    . ciclopirox (PENLAC) 8 %  solution ciclopirox 8 % topical solution    . clonazePAM (KLONOPIN) 0.5 MG tablet Take 1 tablet (0.5 mg total) by mouth 2 (two) times daily as needed for anxiety. 60 tablet 1  . cyclobenzaprine (FLEXERIL) 10 MG tablet Take 10 mg by mouth 3 (three) times daily as needed for muscle spasms.   5  . diltiazem (CARDIZEM CD) 240 MG 24 hr capsule Take 1 capsule (240 mg total) by mouth daily. 30 capsule 3  . dofetilide (TIKOSYN) 250 MCG capsule Take 1 capsule (250 mcg total) by mouth 2 (two) times daily. 180 capsule 3  . famotidine (PEPCID) 20 MG tablet Take 1 tablet (20 mg total) by mouth daily. 30 tablet 0  . furosemide (LASIX) 20 MG tablet Take 1 tablet (20 mg total) by mouth daily. 90 tablet 3  . gabapentin (NEURONTIN) 100 MG capsule Take 100 mg by mouth  at bedtime.    . influenza vaccine (FLUCELVAX QUADRIVALENT) 0.5 ML injection Flucelvax Quad 2020-2021 (PF) 60 mcg (15 mcg x 4)/0.5 mL IM syringe  PHARMACY ADMINISTERED    . ketoconazole (NIZORAL) 2 % cream ketoconazole 2 % topical cream  APP EXT AA BID    . lactulose (CONSTULOSE) 10 GM/15ML solution Constulose 10 gram/15 mL oral solution  TAKE 15 TO 30 ML BY MOUTH ONCE DAILY    . Magnesium 200 MG TABS Take 1 tablet (200 mg total) by mouth daily. 30 each   . meclizine (ANTIVERT) 25 MG tablet Take 1 tablet (25 mg total) by mouth 3 (three) times daily as needed for dizziness. 45 tablet 0  . metoprolol succinate (TOPROL XL) 25 MG 24 hr tablet Take 0.5 tablets (12.5 mg total) by mouth daily. 30 tablet 3  . Nystatin POWD Apply small amount to affected area. 1 Bottle 3  . oxyCODONE (ROXICODONE) 5 MG/5ML solution Take 6.5 mg by mouth every 4 (four) hours as needed for moderate pain.     . polyethylene glycol (MIRALAX / GLYCOLAX) packet Take 17 g by mouth daily as needed for mild constipation.    . potassium chloride (K-DUR) 10 MEQ tablet Take 1 tablet (10 mEq total) by mouth daily. 90 tablet 3  . rivaroxaban (XARELTO) 20 MG TABS tablet Take 1 tablet (20 mg total) by mouth daily with supper. 30 tablet 3  . valACYclovir (VALTREX) 1000 MG tablet TK 2 TS PO TONIGHT AND 2 TS TOMORROW MORNING UTD  0  . Vilazodone HCl (VIIBRYD) 10 MG TABS Viibryd 10 mg tablet  TAKE 1 2 (ONE HALF) TABLET BY MOUTH ONCE DAILY FOR 7 DAYS THEN INCREASE TO 1 WHOLE TABLET ONCE DAILY     No current facility-administered medications for this encounter.     Allergies  Allergen Reactions  . Buprenorphine     Other reaction(s): nausea, sedation and adhesive reaction  . Contrast Media [Iodinated Diagnostic Agents] Hives    unknown  . Cymbalta [Duloxetine Hcl]     Severe diarrhea and upset stomach  . Erythromycin     unknown  . Hydrocodone     Other reaction(s): sedation and nausea  . Hydromorphone     Other reaction(s):  sedation and nausea  . Iodine   . Latex     hives Other reaction(s): hives and sores  . Levofloxacin Nausea And Vomiting  . Metrizamide   . Nsaids     Other reaction(s): Other (See Comments) CANNOT TAKE PER CARDIOLOGIST DUE TO AFIB   . Nucynta  [Tapentadol Hcl]     Other reaction(s):  nausea and sedation  . Other     CAN NOT TAKE , aLPRAZOLAM, OR ELAVIL DUE TO AFIB FOR ANXIETY  IV CONTRAST /"DYE".  . Oxycodone     Other reaction(s): Delusions (intolerance)  PILLS ONLY Other reaction(s): sedation and nausea  . Pentazocine     Other reaction(s): Unknown  . Septra [Bactrim]     Hives   . Sulfa Antibiotics     rash  . Sulfasalazine   . Adhesive [Tape] Rash and Other (See Comments)    Heart monitor stickers must be rotated in order to prevent rash  . Morphine Anxiety    Other reaction(s): sedation and nausea    Social History   Socioeconomic History  . Marital status: Divorced    Spouse name: Not on file  . Number of children: Not on file  . Years of education: Not on file  . Highest education level: Not on file  Occupational History  . Not on file  Social Needs  . Financial resource strain: Not on file  . Food insecurity    Worry: Not on file    Inability: Not on file  . Transportation needs    Medical: Not on file    Non-medical: Not on file  Tobacco Use  . Smoking status: Former Smoker    Packs/day: 0.10    Years: 14.00    Pack years: 1.40    Types: Cigarettes  . Smokeless tobacco: Never Used  . Tobacco comment: 03/31/2018 "quit ~ 1980; someday smoker when I did smoke; never addicted"  Substance and Sexual Activity  . Alcohol use: Never    Frequency: Never  . Drug use: Yes    Types: Oxycodone    Comment: 03/31/2018 "for chronic neck and back pain"  . Sexual activity: Not Currently  Lifestyle  . Physical activity    Days per week: Not on file    Minutes per session: Not on file  . Stress: Not on file  Relationships  . Social Herbalist on  phone: Not on file    Gets together: Not on file    Attends religious service: Not on file    Active member of club or organization: Not on file    Attends meetings of clubs or organizations: Not on file    Relationship status: Not on file  . Intimate partner violence    Fear of current or ex partner: Not on file    Emotionally abused: Not on file    Physically abused: Not on file    Forced sexual activity: Not on file  Other Topics Concern  . Not on file  Social History Narrative  . Not on file    Family History  Problem Relation Age of Onset  . Diabetes Father   . Hyperlipidemia Sister   . Heart disease Sister   . Stroke Sister   . Diabetes Brother   . Hyperlipidemia Sister   . Heart disease Sister   . Arthritis Mother   . Heart disease Mother   . Uterine cancer Mother   . Diabetes Brother   . Heart Problems Brother     ROS- All systems are reviewed and negative except as per the HPI above  Physical Exam: Vitals:   05/18/19 1528  BP: (!) 146/56  Pulse: 87  Weight: 56.7 kg  Height: 5\' 6"  (1.676 m)   Wt Readings from Last 3 Encounters:  05/18/19 56.7 kg  04/25/19 56.2 kg  04/12/19 54.9 kg  Labs: Lab Results  Component Value Date   NA 141 12/06/2018   K 4.1 12/06/2018   CL 107 12/06/2018   CO2 28 12/06/2018   GLUCOSE 95 12/06/2018   BUN 23 12/06/2018   CREATININE 0.63 12/06/2018   CALCIUM 9.3 12/06/2018   MG 2.0 12/06/2018   Lab Results  Component Value Date   INR 0.93 04/24/2011   Lab Results  Component Value Date   CHOL 237 (H) 09/23/2017   HDL 100.90 09/23/2017   LDLCALC 116 (H) 09/23/2017   TRIG 101.0 09/23/2017     GEN- The patient is well appearing, alert and oriented x 3 today.   Head- normocephalic, atraumatic Eyes-  Sclera clear, conjunctiva pink Ears- hearing intact Oropharynx- clear Neck- supple, no JVP Lymph- no cervical lymphadenopathy Lungs- Clear to ausculation bilaterally, normal work of breathing Heart- Regular rate  and rhythm, no murmurs, rubs or gallops, PMI not laterally displaced GI- soft, NT, ND, + BS Extremities- no clubbing, cyanosis, or edema MS- no significant deformity or atrophy Skin- no rash or lesion Psych- euthymic mood, full affect Neuro- strength and sensation are intact  EKG-Sinus rhythm at 87 bpm, pr int 122 bpm, qrs int 86 bpm, qtc 457 ms  Epic records reviewed  Assessment and Plan:  1. Palpitations/Afib I am not sure what pt is feeling so will place a one week ZIO patch to further assess Continue 12.5 metoprolol succinate qd and cardizem 240 mg q pm Continue dofetilide 250 mcg bid Bmet/mag today  2. CHA2DS2VASc score of 3 Continue xarelto 20 mg daily  I will contact pt when results of monitor are seen  Butch Penny C. Cameran Ahmed, [facility] [address] [place], [place] [address] [phone]

## 2019-05-26 DIAGNOSIS — R201 Hypoesthesia of skin: Secondary | ICD-10-CM | POA: Diagnosis not present

## 2019-05-26 DIAGNOSIS — M5412 Radiculopathy, cervical region: Secondary | ICD-10-CM | POA: Diagnosis not present

## 2019-05-26 DIAGNOSIS — M5481 Occipital neuralgia: Secondary | ICD-10-CM | POA: Diagnosis not present

## 2019-05-26 DIAGNOSIS — M542 Cervicalgia: Secondary | ICD-10-CM | POA: Diagnosis not present

## 2019-05-27 ENCOUNTER — Encounter: Payer: Medicare Other | Admitting: Family Medicine

## 2019-06-03 DIAGNOSIS — I480 Paroxysmal atrial fibrillation: Secondary | ICD-10-CM | POA: Diagnosis not present

## 2019-06-06 ENCOUNTER — Telehealth (HOSPITAL_COMMUNITY): Payer: Self-pay | Admitting: *Deleted

## 2019-06-06 DIAGNOSIS — M961 Postlaminectomy syndrome, not elsewhere classified: Secondary | ICD-10-CM | POA: Diagnosis not present

## 2019-06-06 DIAGNOSIS — Z981 Arthrodesis status: Secondary | ICD-10-CM | POA: Diagnosis not present

## 2019-06-06 DIAGNOSIS — M4693 Unspecified inflammatory spondylopathy, cervicothoracic region: Secondary | ICD-10-CM | POA: Diagnosis not present

## 2019-06-06 DIAGNOSIS — M5412 Radiculopathy, cervical region: Secondary | ICD-10-CM | POA: Diagnosis not present

## 2019-06-06 DIAGNOSIS — G894 Chronic pain syndrome: Secondary | ICD-10-CM | POA: Diagnosis not present

## 2019-06-06 MED ORDER — METOPROLOL SUCCINATE ER 25 MG PO TB24: 12.5000 mg | ORAL_TABLET | Freq: Two times a day (BID) | ORAL | 3 refills | Status: DC

## 2019-06-06 NOTE — Telephone Encounter (Signed)
-----   Message from Sherran Needs, NP sent at 06/06/2019 12:48 PM EST ----- Please let pt know no afib on monitor. Has many short little fast runs,  longest 15 secs. She may want to increase  BB  to 25 mg daily

## 2019-06-06 NOTE — Telephone Encounter (Signed)
Pt is going to increase her metoprolol to 12.5mg  BID. She will call if issues.

## 2019-06-06 NOTE — Addendum Note (Signed)
Encounter addended by: Juluis Mire, RN on: 06/06/2019 10:23 AM  Actions taken: Imaging Exam ended

## 2019-06-09 ENCOUNTER — Ambulatory Visit: Payer: Medicare Other | Admitting: Cardiology

## 2019-06-15 DIAGNOSIS — D1801 Hemangioma of skin and subcutaneous tissue: Secondary | ICD-10-CM | POA: Diagnosis not present

## 2019-06-15 DIAGNOSIS — L820 Inflamed seborrheic keratosis: Secondary | ICD-10-CM | POA: Diagnosis not present

## 2019-06-15 DIAGNOSIS — I8391 Asymptomatic varicose veins of right lower extremity: Secondary | ICD-10-CM | POA: Diagnosis not present

## 2019-06-15 DIAGNOSIS — L814 Other melanin hyperpigmentation: Secondary | ICD-10-CM | POA: Diagnosis not present

## 2019-06-15 DIAGNOSIS — Z85820 Personal history of malignant melanoma of skin: Secondary | ICD-10-CM | POA: Diagnosis not present

## 2019-06-15 DIAGNOSIS — L821 Other seborrheic keratosis: Secondary | ICD-10-CM | POA: Diagnosis not present

## 2019-06-15 DIAGNOSIS — D692 Other nonthrombocytopenic purpura: Secondary | ICD-10-CM | POA: Diagnosis not present

## 2019-06-27 ENCOUNTER — Encounter: Payer: Self-pay | Admitting: Emergency Medicine

## 2019-06-27 ENCOUNTER — Emergency Department (INDEPENDENT_AMBULATORY_CARE_PROVIDER_SITE_OTHER)
Admission: EM | Admit: 2019-06-27 | Discharge: 2019-06-27 | Disposition: A | Discharge status: Home / Self Care | Payer: Medicare Other | Source: Home / Self Care | Attending: Emergency Medicine | Admitting: Emergency Medicine

## 2019-06-27 ENCOUNTER — Encounter: Payer: Self-pay | Admitting: Cardiology

## 2019-06-27 DIAGNOSIS — M792 Neuralgia and neuritis, unspecified: Secondary | ICD-10-CM

## 2019-06-27 DIAGNOSIS — S161XXA Strain of muscle, fascia and tendon at neck level, initial encounter: Secondary | ICD-10-CM | POA: Diagnosis not present

## 2019-06-27 DIAGNOSIS — M25512 Pain in left shoulder: Secondary | ICD-10-CM

## 2019-06-27 DIAGNOSIS — S46912A Strain of unspecified muscle, fascia and tendon at shoulder and upper arm level, left arm, initial encounter: Secondary | ICD-10-CM

## 2019-06-27 NOTE — Discharge Instructions (Addendum)
Apply heat to the area of pain. Consider treatment with acupuncture. Massage the area of discomfort. Proceed with diagnostic studies of your heart which have already been ordered.

## 2019-06-27 NOTE — ED Provider Notes (Signed)
[name]    CSN: [id number] Arrival date & time: 06/27/19  1259      History   Chief Complaint Chief Complaint  Patient presents with  . Shoulder Pain    HPI [name] is a 73 y.o. female.   HPI Patient states that yesterday [name] may have overused her left arm while cooking.  Today [name] began experiencing severe pain along her left medial shoulder.  [name] has had this pain before.  [name] has had no substernal pain.  [name] took a pain pill as well as Pepcid but has continued to have discomfort.  [name] enters today for evaluation.  Of note [name] has had a fusion of her entire back.  [name] does have a pain management physician as well as being seen in the A. fib clinic at [facility]. Past Medical History:  Diagnosis Date  . Anxiety   . Arthritis    "maybe in my back" (03/31/2018)  . Benign paroxysmal positional vertigo 06/08/2013  . Fracture of multiple ribs 2015   "don't know from what; dx'd when I in hospital for 1st back OR" (03/31/2018)  . GERD (gastroesophageal reflux disease)   . Hair loss 04/12/2012  . Herpes   . History of blood transfusion    "twice; related to back OR" (03/31/2018)  . History of kidney stones   . Interstitial cystitis 11/06/2011  . 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 Osteopenia 02/18/2012  . Osteoporosis   . PAF (paroxysmal atrial fibrillation) ([place]) 2012  . Peripheral neuropathy 11/06/2011  . PMDD (premenstrual dysphoric disorder)   . Seasonal allergies   . Vaginal delivery    ONE NSVD  . 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 Left inguinal hernia 03/11/2019  . Visit for monitoring Tikosyn therapy 03/29/2018  . Osteoporosis 09/23/2017  . Fatigue 02/18/2017  . Physical exam 04/21/2016  . Anxiety and depression 02/13/2016  . Chronic pain 02/13/2016  . Vertigo 11/16/2015  . B12 deficiency 10/01/2015  . Hearing loss due to cerumen impaction 08/23/2015  . Foot pain, right 05/23/2015  . Hyperlipidemia  04/19/2015  . Protein-calorie malnutrition ([place]) 10/19/2014  . Fracture of multiple ribs 08/07/2014  . Paroxysmal atrial fibrillation ([facility])   . Tachycardia   . Anemia 08/06/2014  . Constipation 07/14/2014  . Lumbar back pain 05/19/2012  . Seasonal allergic rhinitis 11/06/2011  . Neuropathy 11/06/2011  . S/P cervical spinal fusion 11/06/2011  . GERD (gastroesophageal reflux disease) 11/06/2011    Past Surgical History:  Procedure Laterality Date  . ANTERIOR CERVICAL DECOMP/DISCECTOMY FUSION  ~ 2003  . BACK SURGERY    . BREAST SURGERY     BREAST BIOPSY--RIGHT BENIGN  . BUNIONECTOMY Bilateral   . COSMETIC SURGERY  2016   "back of my neck; related to earlier fusion"  . CYSTOSCOPY W/ STONE MANIPULATION  "several times"  . DILATION AND CURETTAGE OF UTERUS    . FOREHEAD RECONSTRUCTION Right    "removed bone protruding out of my forehead"  . HARDWARE REMOVAL  2016   "related to neck OR"  . INCONTINENCE SURGERY    . POSTERIOR CERVICAL FUSION/FORAMINOTOMY  ~ 2008; 2015  . SHOULDER ARTHROSCOPY W/ ROTATOR CUFF REPAIR Right 2012  . SPINAL FUSION  06/2014 - 2018 X ?7   "scoliosis; my entire back"  . TUBAL LIGATION    . VAGINAL HYSTERECTOMY     TVH    OB History  Gravida  1   Para  1   Term  1   Preterm      AB      Living  1     SAB      TAB      Ectopic      Multiple      Live Births  1            Home Medications    Prior to Admission medications   Medication Sig Start Date End Date Taking? Authorizing Provider  acetaminophen (TYLENOL) 500 MG tablet Take 1,000 mg by mouth every 6 (six) hours as needed for mild pain.     [provider]  busPIRone (BUSPAR) 5 MG tablet Take 1 tablet (5 mg total) by mouth 2 (two) times daily. 03/24/19   [name], MD  Calcium Carbonate Antacid (TUMS CHEWY BITES PO) Take 1 tablet by mouth daily as needed (reflux).     [provider]  Calcium Citrate-Vitamin D (CALCIUM + D PO) Take 1 tablet by  mouth daily.    [provider]  ciclopirox (PENLAC) 8 % solution ciclopirox 8 % topical solution    [provider]  clonazePAM (KLONOPIN) 0.5 MG tablet Take 1 tablet (0.5 mg total) by mouth 2 (two) times daily as needed for anxiety. 03/11/19   [name], MD  cyclobenzaprine (FLEXERIL) 10 MG tablet Take 10 mg by mouth 3 (three) times daily as needed for muscle spasms.  12/10/17   [provider]  diltiazem (CARDIZEM CD) 240 MG 24 hr capsule Take 1 capsule (240 mg total) by mouth daily. 05/05/19   Camnitz, Ocie Doyne, MD  dofetilide (TIKOSYN) 250 MCG capsule Take 1 capsule (250 mcg total) by mouth 2 (two) times daily. 06/11/18   Sherran Needs, NP  famotidine (PEPCID) 20 MG tablet Take 1 tablet (20 mg total) by mouth daily. 04/12/19   Darlyne Russian, MD  furosemide (LASIX) 20 MG tablet Take 1 tablet (20 mg total) by mouth daily. 05/06/18   Evans Lance, MD  gabapentin (NEURONTIN) 100 MG capsule Take 100 mg by mouth at bedtime.    [provider]  influenza vaccine (FLUCELVAX QUADRIVALENT) 0.5 ML injection Flucelvax Quad 2020-2021 (PF) 60 mcg (15 mcg x 4)/0.5 mL IM syringe  PHARMACY ADMINISTERED    [provider]  ketoconazole (NIZORAL) 2 % cream ketoconazole 2 % topical cream  APP EXT AA BID    [provider]  lactulose (CONSTULOSE) 10 GM/15ML solution Constulose 10 gram/15 mL oral solution  TAKE 15 TO 30 ML BY MOUTH ONCE DAILY    [provider]  Magnesium 200 MG TABS Take 1 tablet (200 mg total) by mouth daily. 07/26/18   Sherran Needs, NP  meclizine (ANTIVERT) 25 MG tablet Take 1 tablet (25 mg total) by mouth 3 (three) times daily as needed for dizziness. 07/09/17   [name], MD  metoprolol succinate (TOPROL XL) 25 MG 24 hr tablet Take 0.5 tablets (12.5 mg total) by mouth 2 (two) times daily. 06/06/19   Sherran Needs, NP  Nystatin POWD Apply small amount to affected area. 12/08/17   Princess Bruins, MD   oxyCODONE (ROXICODONE) 5 MG/5ML solution Take 6.5 mg by mouth every 4 (four) hours as needed for moderate pain.  10/31/15   [provider]  polyethylene glycol (MIRALAX / GLYCOLAX) packet Take 17 g by mouth daily as needed for mild constipation.    [provider]  potassium chloride (K-DUR) 10 MEQ tablet Take 1 tablet (10 mEq total) by mouth daily. 11/23/18   Evans Lance, MD  rivaroxaban (XARELTO) 20 MG TABS tablet Take 1 tablet (20 mg total) by mouth daily with supper. 01/11/19   Camnitz, Will Hassell Done, MD  valACYclovir (VALTREX) 1000 MG tablet TK 2 TS PO TONIGHT AND 2 TS TOMORROW MORNING UTD 06/09/18   [provider]  Vilazodone HCl (VIIBRYD) 10 MG TABS Viibryd 10 mg tablet  TAKE 1 2 (ONE HALF) TABLET BY MOUTH ONCE DAILY FOR 7 DAYS THEN INCREASE TO 1 WHOLE TABLET ONCE DAILY    [provider]    Family History Family History  Problem Relation Age of Onset  . Diabetes Father   . Hyperlipidemia Sister   . Heart disease Sister   . Stroke Sister   . Diabetes Brother   . Hyperlipidemia Sister   . Heart disease Sister   . Arthritis Mother   . Heart disease Mother   . Uterine cancer Mother   . Diabetes Brother   . Heart Problems Brother     Social History Social History   Tobacco Use  . Smoking status: Former Smoker    Packs/day: 0.10    Years: 14.00    Pack years: 1.40    Types: Cigarettes  . Smokeless tobacco: Never Used  . Tobacco comment: 03/31/2018 "quit ~ 1980; someday smoker when I did smoke; never addicted"  Substance Use Topics  . Alcohol use: Never  . Drug use: Yes    Types: Oxycodone    Comment: 03/31/2018 "for chronic neck and back pain"     Allergies   Buprenorphine, Contrast media [iodinated diagnostic agents], Cymbalta [duloxetine hcl], Erythromycin, Hydrocodone, Hydromorphone, Iodine, Latex, Levofloxacin, Metrizamide, Nsaids, Nucynta  [tapentadol hcl], Other, Oxycodone, Pentazocine, Septra [bactrim], Sulfa antibiotics,  Sulfasalazine, Adhesive [tape], and Morphine   Review of Systems Review of Systems  Constitutional: Negative.   Respiratory: Negative.   Cardiovascular:       [name] has a history of atrial fib but no substernal pain.  Gastrointestinal: Negative.   Genitourinary: Negative.   Musculoskeletal:       [name] has severe pain along the left shoulder blade     Physical Exam Triage Vital Signs ED Triage Vitals  Enc Vitals Group     BP 06/27/19 1306 130/84     Pulse Rate 06/27/19 1306 78     Resp --      Temp 06/27/19 1306 97.9 F (36.6 C)     Temp Source 06/27/19 1306 Oral     SpO2 06/27/19 1306 98 %     Weight --      Height --      Head Circumference --      Peak Flow --      Pain Score 06/27/19 1304 9     Pain Loc --      Pain Edu? --      Excl. in [place]? --    No data found.  Updated Vital Signs BP 130/84 (BP Location: Right Arm)   Pulse 78   Temp 97.9 F (36.6 C) (Oral)   SpO2 98%   Visual Acuity Right Eye Distance:   Left Eye Distance:   Bilateral Distance:    Right Eye Near:   Left Eye Near:    Bilateral Near:     Physical Exam Constitutional:      Appearance: Normal appearance.  Neck:     Comments: There  is a surgical scar which extends from the base of the skull to the lower lumbar spine.  There is also a scar left anterior portion of the neck.  There is decreased range of motion of the neck.  There is significant tenderness along the medial scapula. Cardiovascular:     Rate and Rhythm: Normal rate and regular rhythm.     Heart sounds: Murmur present.  Pulmonary:     Effort: Pulmonary effort is normal.     Breath sounds: Normal breath sounds.  Neurological:     Mental Status: [name] is alert.      UC Treatments / Results  Labs (all labs ordered are listed, but only abnormal results are displayed) Labs Reviewed - No data to display  EKG Normal sinus rhythm no acute changes  Radiology No results found.  Procedures Procedures (including critical  care time)  Medications Ordered in UC Medications - No data to display  Initial Impression / Assessment and Plan / UC Course  I have reviewed the triage vital signs and the nursing notes. I did not repeat x-rays or change medications at the present time.  I told her to use heat and massage to the area and applied a TENS unit.  I told her I thought [name] would benefit for consideration of acupuncture to alleviate this pain.  Films were done of the neck and shoulder in October.  So they were not repeated today.  I do think acupuncture would be a great alternative. Pertinent labs & imaging results that were available during my care of the patient were reviewed by me and considered in my medical decision making (see chart for details).      Final Clinical Impressions(s) / UC Diagnoses   Final diagnoses:  Strain of left shoulder, initial encounter  Acute strain of neck muscle, initial encounter  Neuralgia     Discharge Instructions     Apply heat to the area of pain. Consider treatment with acupuncture. Massage the area of discomfort. Proceed with diagnostic studies of your heart which have already been ordered.    ED Prescriptions    None     PDMP not reviewed this encounter.   Darlyne Russian, MD 06/27/19 1440

## 2019-06-27 NOTE — Telephone Encounter (Signed)
New message:      Patient husband call stating that patient need a appt soon. Please call patient husband back.

## 2019-06-27 NOTE — ED Triage Notes (Signed)
Pt states she woke up with a pain in her shoulder that radiates to her chest. She took her reflux meds and pain medication with no relief.

## 2019-06-27 NOTE — Telephone Encounter (Addendum)
Incorrect chart.  Will error this and place in correct pt chart.  Will notify supervisor of this issue.

## 2019-06-28 NOTE — Telephone Encounter (Signed)
This encounter was created in error - please disregard.

## 2019-07-04 ENCOUNTER — Telehealth: Payer: Self-pay

## 2019-07-04 NOTE — Telephone Encounter (Signed)
Patient has been seen by urgent care at [facility] in [place] twice. States that she is having pain between her shoulders. Has had several f/u appts with cardiology and her surgeon over the past several months. Scheduled patient for appt on Monday.

## 2019-07-05 NOTE — Telephone Encounter (Signed)
I can see patient but given her neck and back problems, this is likely an issue that needs to be addressed by her back/neck specialist/surgeon

## 2019-07-07 ENCOUNTER — Encounter: Payer: Self-pay | Admitting: Family Medicine

## 2019-07-11 ENCOUNTER — Ambulatory Visit: Payer: Medicare Other | Admitting: Family Medicine

## 2019-07-16 DIAGNOSIS — Z87891 Personal history of nicotine dependence: Secondary | ICD-10-CM | POA: Diagnosis not present

## 2019-07-16 DIAGNOSIS — G629 Polyneuropathy, unspecified: Secondary | ICD-10-CM | POA: Diagnosis not present

## 2019-07-16 DIAGNOSIS — Z7982 Long term (current) use of aspirin: Secondary | ICD-10-CM | POA: Diagnosis not present

## 2019-07-16 DIAGNOSIS — Z794 Long term (current) use of insulin: Secondary | ICD-10-CM | POA: Diagnosis not present

## 2019-07-16 DIAGNOSIS — R457 State of emotional shock and stress, unspecified: Secondary | ICD-10-CM | POA: Diagnosis not present

## 2019-07-16 DIAGNOSIS — Z7901 Long term (current) use of anticoagulants: Secondary | ICD-10-CM | POA: Diagnosis not present

## 2019-07-16 DIAGNOSIS — R000 Tachycardia, unspecified: Secondary | ICD-10-CM | POA: Diagnosis not present

## 2019-07-16 DIAGNOSIS — F329 Major depressive disorder, single episode, unspecified: Secondary | ICD-10-CM | POA: Diagnosis not present

## 2019-07-16 DIAGNOSIS — K219 Gastro-esophageal reflux disease without esophagitis: Secondary | ICD-10-CM | POA: Diagnosis not present

## 2019-07-16 DIAGNOSIS — R040 Epistaxis: Secondary | ICD-10-CM | POA: Diagnosis not present

## 2019-07-16 DIAGNOSIS — Z79899 Other long term (current) drug therapy: Secondary | ICD-10-CM | POA: Diagnosis not present

## 2019-07-17 ENCOUNTER — Telehealth: Payer: Self-pay | Admitting: Physician Assistant

## 2019-07-17 NOTE — Telephone Encounter (Signed)
Pt called to review her instructions from [facility] ER following nose bleed.  I reviewed the EDP note which instructed her to hold Xarelto for 3 days.  We reviewed using humidification, saline nasal spray, Afrin twice daily, and restarting her Xarelto on Wednesday.  She understood the plan.

## 2019-07-20 DIAGNOSIS — R040 Epistaxis: Secondary | ICD-10-CM | POA: Diagnosis not present

## 2019-07-22 ENCOUNTER — Telehealth (HOSPITAL_COMMUNITY): Payer: Self-pay | Admitting: *Deleted

## 2019-07-22 NOTE — Telephone Encounter (Signed)
Patient wanted to inform she did go to ENT for nose bleed - and they were able to cauterize and has restarted xarelto.

## 2019-07-25 ENCOUNTER — Ambulatory Visit (HOSPITAL_COMMUNITY): Payer: Medicare Other | Attending: Cardiovascular Disease

## 2019-07-25 ENCOUNTER — Other Ambulatory Visit: Payer: Self-pay

## 2019-07-25 DIAGNOSIS — I480 Paroxysmal atrial fibrillation: Secondary | ICD-10-CM

## 2019-07-25 DIAGNOSIS — I340 Nonrheumatic mitral (valve) insufficiency: Secondary | ICD-10-CM | POA: Diagnosis not present

## 2019-07-29 DIAGNOSIS — H93299 Other abnormal auditory perceptions, unspecified ear: Secondary | ICD-10-CM | POA: Diagnosis not present

## 2019-07-29 DIAGNOSIS — R0981 Nasal congestion: Secondary | ICD-10-CM | POA: Diagnosis not present

## 2019-08-01 ENCOUNTER — Encounter: Payer: Self-pay | Admitting: Cardiology

## 2019-08-01 ENCOUNTER — Other Ambulatory Visit (HOSPITAL_COMMUNITY): Payer: Self-pay | Admitting: *Deleted

## 2019-08-01 ENCOUNTER — Other Ambulatory Visit: Payer: Self-pay

## 2019-08-01 ENCOUNTER — Ambulatory Visit (INDEPENDENT_AMBULATORY_CARE_PROVIDER_SITE_OTHER): Payer: Medicare Other | Admitting: Cardiology

## 2019-08-01 VITALS — BP 122/68 | HR 77 | Ht 66.0 in | Wt 124.8 lb

## 2019-08-01 DIAGNOSIS — I480 Paroxysmal atrial fibrillation: Secondary | ICD-10-CM | POA: Diagnosis not present

## 2019-08-01 MED ORDER — FUROSEMIDE 20 MG PO TABS: 20.0000 mg | ORAL_TABLET | Freq: Every day | ORAL | 2 refills | Status: DC | PRN

## 2019-08-01 MED ORDER — DOFETILIDE 250 MCG PO CAPS: 250.0000 ug | ORAL_CAPSULE | Freq: Two times a day (BID) | ORAL | 3 refills | Status: DC

## 2019-08-01 MED ORDER — DILTIAZEM HCL ER COATED BEADS 300 MG PO CP24: 300.0000 mg | ORAL_CAPSULE | Freq: Every day | ORAL | 6 refills | Status: DC

## 2019-08-01 NOTE — Progress Notes (Signed)
Electrophysiology Office Note   Date:  08/02/2019   ID:  Regina, [name] September 09, 1945, MRN [id number]  PCP:  [name], MD  Cardiologist:  Lovena Le Primary Electrophysiologist: Gaye Alken, MD    No chief complaint on file.    History of Present Illness: [name] is a 74 y.o. female who is being seen today for the evaluation of atrial fibrillation at the request of [name] [name], MD. Presenting today for electrophysiology evaluation.  She has severe panic and anxiety disorder.  She is currently on dofetilide.  She is have been having breakthrough episodes of atrial fibrillation.  Today, denies symptoms of palpitations, chest pain, shortness of breath, orthopnea, PND, lower extremity edema, claudication, dizziness, presyncope, syncope, bleeding, or neurologic sequela. The patient is tolerating medications without difficulties.  He is overall feeling well.  She is noted no further episodes of atrial fibrillation.  She does continue to have significant issues with anxiety that are worsened by the coronavirus pandemic.   Past Medical History:  Diagnosis Date  . Anxiety   . Arthritis    "maybe in my back" (03/31/2018)  . Benign paroxysmal positional vertigo 06/08/2013  . Fracture of multiple ribs 2015   "don't know from what; dx'd when I in hospital for 1st back OR" (03/31/2018)  . GERD (gastroesophageal reflux disease)   . Hair loss 04/12/2012  . Herpes   . History of blood transfusion    "twice; related to back OR" (03/31/2018)  . History of kidney stones   . Interstitial cystitis 11/06/2011  . Melanoma of ankle ([place]) ~ 2003   "right"  . Osteopenia 02/18/2012  . Osteoporosis   . PAF (paroxysmal atrial fibrillation) ([place]) 2012  . Peripheral neuropathy 11/06/2011  . PMDD (premenstrual dysphoric disorder)   . Seasonal allergies   . Vaginal delivery    ONE NSVD  . 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 ANTERIOR  CERVICAL DECOMP/DISCECTOMY FUSION  ~ 2003  . BACK SURGERY    . BREAST SURGERY     BREAST BIOPSY--RIGHT BENIGN  . BUNIONECTOMY Bilateral   . COSMETIC SURGERY  2016   "back of my neck; related to earlier fusion"  . CYSTOSCOPY W/ STONE MANIPULATION  "several times"  . DILATION AND CURETTAGE OF UTERUS    . FOREHEAD RECONSTRUCTION Right    "removed bone protruding out of my forehead"  . HARDWARE REMOVAL  2016   "related to neck OR"  . INCONTINENCE SURGERY    . POSTERIOR CERVICAL FUSION/FORAMINOTOMY  ~ 2008; 2015  . SHOULDER ARTHROSCOPY W/ ROTATOR CUFF REPAIR Right 2012  . SPINAL FUSION  06/2014 - 2018 X ?7   "scoliosis; my entire back"  . TUBAL LIGATION    . VAGINAL HYSTERECTOMY     TVH     Current Outpatient Medications  Medication Sig Dispense Refill  . acetaminophen (TYLENOL) 500 MG tablet Take 1,000 mg by mouth every 6 (six) hours as needed for mild pain.     . Calcium Carbonate Antacid (TUMS CHEWY BITES PO) Take 1 tablet by mouth daily as needed (reflux).     . Calcium Citrate-Vitamin D (CALCIUM + D PO) Take 1 tablet by mouth daily.    . clonazePAM (KLONOPIN) 0.5 MG tablet Take 1 tablet (0.5 mg total) by mouth 2 (two) times daily as needed for anxiety. 60 tablet 1  . cyclobenzaprine (FLEXERIL) 10 MG tablet Take 10 mg by mouth 3 (three) times  daily as needed for muscle spasms.   5  . dofetilide (TIKOSYN) 250 MCG capsule Take 1 capsule (250 mcg total) by mouth 2 (two) times daily. 180 capsule 3  . famotidine (PEPCID) 20 MG tablet Take 1 tablet (20 mg total) by mouth daily. 30 tablet 0  . gabapentin (NEURONTIN) 100 MG capsule Take 300 mg by mouth at bedtime.     [name] ketoconazole (NIZORAL) 2 % cream ketoconazole 2 % topical cream  APP EXT AA BID    . lactulose (CONSTULOSE) 10 GM/15ML solution Constulose 10 gram/15 mL oral solution  TAKE 15 TO 30 ML BY MOUTH ONCE DAILY    . Magnesium 200 MG TABS Take 1 tablet (200 mg total) by mouth daily. 30 each   . meclizine (ANTIVERT) 25 MG tablet  Take 1 tablet (25 mg total) by mouth 3 (three) times daily as needed for dizziness. 45 tablet 0  . Nystatin POWD Apply small amount to affected area. 1 Bottle 3  . oxyCODONE (ROXICODONE) 5 MG/5ML solution Take 6.5 mg by mouth every 4 (four) hours as needed for moderate pain.     . polyethylene glycol (MIRALAX / GLYCOLAX) packet Take 17 g by mouth daily as needed for mild constipation.    . potassium chloride (K-DUR) 10 MEQ tablet Take 1 tablet (10 mEq total) by mouth daily. 90 tablet 3  . rivaroxaban (XARELTO) 20 MG TABS tablet Take 1 tablet (20 mg total) by mouth daily with supper. 30 tablet 3  . valACYclovir (VALTREX) 1000 MG tablet TK 2 TS PO TONIGHT AND 2 TS TOMORROW MORNING UTD  0  . Vilazodone HCl (VIIBRYD) 10 MG TABS Viibryd 10 mg tablet  TAKE 1 2 (ONE HALF) TABLET BY MOUTH ONCE DAILY FOR 7 DAYS THEN INCREASE TO 1 WHOLE TABLET ONCE DAILY    . diltiazem (CARDIZEM CD) 300 MG 24 hr capsule Take 1 capsule (300 mg total) by mouth daily. 30 capsule 6  . furosemide (LASIX) 20 MG tablet Take 1 tablet (20 mg total) by mouth daily as needed for fluid or edema. 30 tablet 2   No current facility-administered medications for this visit.    Allergies:   Buprenorphine, Contrast media [iodinated diagnostic agents], Cymbalta [duloxetine hcl], Erythromycin, Hydrocodone, Hydromorphone, Iodine, Latex, Levofloxacin, Metrizamide, Nsaids, Nucynta  [tapentadol hcl], Other, Oxycodone, Pentazocine, Septra [bactrim], Sulfa antibiotics, Sulfasalazine, Adhesive [tape], and Morphine   Social History:  The patient  reports that she has quit smoking. Her smoking use included cigarettes. She has a 1.40 pack-year smoking history. She has never used smokeless tobacco. She reports current drug use. Drug: Oxycodone. She reports that she does not drink alcohol.   Family History:  The patient's family history includes Arthritis in her mother; Diabetes in her brother, brother, and father; Heart Problems in her brother; Heart  disease in her mother, sister, and sister; Hyperlipidemia in her sister and sister; Stroke in her sister; Uterine cancer in her mother.    ROS:  Please see the history of present illness.   Otherwise, review of systems is positive for none.   All other systems are reviewed and negative.   PHYSICAL EXAM: VS:  BP 122/68   Pulse 77   Ht 5\' 6"  (1.676 m)   Wt 124 lb 12.8 oz (56.6 kg)   SpO2 97%   BMI 20.14 kg/m  , BMI Body mass index is 20.14 kg/m. GEN: Well nourished, well developed, in no acute distress  HEENT: normal  Neck: no JVD, carotid bruits, or  masses Cardiac: RRR; 2 out of 6 systolic murmur throughout, no rubs, or gallops,no edema  Respiratory:  clear to auscultation bilaterally, normal work of breathing GI: soft, nontender, nondistended, + BS MS: no deformity or atrophy  Skin: warm and dry Neuro:  Strength and sensation are intact Psych: euthymic mood, full affect  EKG:  EKG is ordered today. Personal review of the ekg ordered shows sinus rhythm, rate 77  Recent Labs: 08/25/2018: Hemoglobin 13.2; Platelets 207.0; TSH 2.49 05/18/2019: BUN 10; Creatinine, Ser 0.65; Magnesium 2.2; Potassium 4.0; Sodium 141    Lipid Panel     Component Value Date/Time   CHOL 237 (H) 09/23/2017 1420   TRIG 101.0 09/23/2017 1420   HDL 100.90 09/23/2017 1420   CHOLHDL 2 09/23/2017 1420   VLDL 20.2 09/23/2017 1420   LDLCALC 116 (H) 09/23/2017 1420   LDLDIRECT 136.5 06/08/2013 1049     Wt Readings from Last 3 Encounters:  08/01/19 124 lb 12.8 oz (56.6 kg)  05/18/19 125 lb (56.7 kg)  04/25/19 124 lb (56.2 kg)      Other studies Reviewed: Additional studies/ records that were reviewed today include: TTE 07/25/2019 Review of the above records today demonstrates:  1. Left ventricular ejection fraction, by visual estimation, is 60 to 65%. The left ventricle has normal function. There is no left ventricular hypertrophy.  2. The left ventricle has no regional wall motion abnormalities.   3. Global right ventricle has normal systolic function.The right ventricular size is normal. No increase in right ventricular wall thickness.  4. Left atrial size was mildly dilated.  5. Right atrial size was normal.  6. The mitral valve is myxomatous. Moderate mitral valve regurgitation. No evidence of mitral stenosis.  7. Bi leaflet prolapse with myxomatous leaflets. Worse in anterior leaflet Posteriorly directed jet poorly characterized by color flow but at least moderate. Can consider TEE if clinically indicated to further characterize.  8. The tricuspid valve is normal in structure.  9. The tricuspid valve is normal in structure. Tricuspid valve regurgitation is mild. 10. The aortic valve is tricuspid. Aortic valve regurgitation is mild. Mild aortic valve sclerosis without stenosis. 11. Pulmonic regurgitation is mild. 12. The pulmonic valve was normal in structure. Pulmonic valve regurgitation is mild. 13. Mildly elevated pulmonary artery systolic pressure. 14. The inferior vena cava is normal in size with greater than 50% respiratory variability, suggesting right atrial pressure of 3 mmHg.   ASSESSMENT AND PLAN:  1.  Paroxysmal atrial fibrillation: Currently on dofetilide and Xarelto.  CHA2DS2-VASc of 3.  Fortunately she remains in normal rhythm.  No changes.   2.  Hypertension: Currently well controlled  3.  Moderate mitral regurgitation: She is not having symptoms of shortness of breath.  She is having some mild lower extremity edema.  Despite that, due to her normal-sized left atrium I do not feel that TEE is necessary at this time.  We Noboru Bidinger discuss this with the reading cardiologist of her most recent echo.  Current medicines are reviewed at length with the patient today.   The patient does not have concerns regarding her medicines.  The following changes were made today: Stop metoprolol, increase diltiazem  Labs/ tests ordered today include:  Orders Placed This Encounter    Procedures  . EKG 12-Lead     Disposition:   FU with Marelyn Rouser 6 months  Signed, Emmerson Shuffield Meredith Leeds, MD  08/02/2019 8:02 AM     [facility] [address] [place] [address] (  [phone] (office) [phone] (fax)

## 2019-08-01 NOTE — Patient Instructions (Addendum)
Medication Instructions:  Your physician has recommended you make the following change in your medication:  1. STOP Metoprolol 2. INCREASE Diltiazem to 300 mg once daily  * If you need a refill on your cardiac medications before your next appointment, please call your pharmacy.   Labwork: None ordered  Testing/Procedures: None ordered  Follow-Up: At [facility], you and your health needs are our priority.  As part of our continuing mission to provide you with exceptional heart care, we have created designated Provider Care Teams.  These Care Teams include your primary Cardiologist (physician) and Advanced Practice Providers (APPs -  Physician Assistants and Nurse Practitioners) who all work together to provide you with the care you need, when you need it.  You will need a follow up appointment in 6 months.  Please call our office 2 months in advance to schedule this appointment.  You may see Dr Curt Bears or one of the following Advanced Practice Providers on your designated Care Team:    Chanetta Marshall, NP  [name], PA-C  Oda Kilts, [place]   Thank you for choosing [facility]!!   Trinidad Curet, RN [phone]  Any Other Special Instructions Will Be Listed Below (If Applicable).

## 2019-08-02 ENCOUNTER — Encounter: Payer: Self-pay | Admitting: Family Medicine

## 2019-08-02 ENCOUNTER — Telehealth: Payer: Self-pay | Admitting: *Deleted

## 2019-08-02 ENCOUNTER — Other Ambulatory Visit: Payer: Self-pay

## 2019-08-02 ENCOUNTER — Ambulatory Visit (INDEPENDENT_AMBULATORY_CARE_PROVIDER_SITE_OTHER): Payer: Medicare Other | Admitting: Family Medicine

## 2019-08-02 DIAGNOSIS — F419 Anxiety disorder, unspecified: Secondary | ICD-10-CM

## 2019-08-02 DIAGNOSIS — Z7189 Other specified counseling: Secondary | ICD-10-CM | POA: Diagnosis not present

## 2019-08-02 DIAGNOSIS — F329 Major depressive disorder, single episode, unspecified: Secondary | ICD-10-CM | POA: Diagnosis not present

## 2019-08-02 DIAGNOSIS — I480 Paroxysmal atrial fibrillation: Secondary | ICD-10-CM

## 2019-08-02 DIAGNOSIS — F32A Depression, unspecified: Secondary | ICD-10-CM

## 2019-08-02 DIAGNOSIS — Z7185 Encounter for immunization safety counseling: Secondary | ICD-10-CM

## 2019-08-02 MED ORDER — CLONAZEPAM 0.5 MG PO TABS: 0.5000 mg | ORAL_TABLET | Freq: Two times a day (BID) | ORAL | 1 refills | Status: DC | PRN

## 2019-08-02 NOTE — Progress Notes (Signed)
I have discussed the procedure for the virtual visit with the patient who has given consent to proceed with assessment and treatment.   Pt unable to obtain vitals.   Nial Hawe L Dallis Darden, CMA     

## 2019-08-02 NOTE — Progress Notes (Signed)
Virtual Visit via Video   I connected with patient on 08/02/19 at  9:30 AM EST by a video enabled telemedicine application and verified that I am speaking with the correct person using two identifiers.  Location patient: Home Location provider: Acupuncturist, Office Persons participating in the virtual visit: Patient, Provider, [place] (Jess B)  I discussed the limitations of evaluation and management by telemedicine and the availability of in person appointments. The patient expressed understanding and agreed to proceed.  Subjective:   HPI:  Cardiology concerns- Metoprolol was stopped yesterday due to fatigue and hair loss.  Diltiazem was increased.  She is now concerned that her Afib will worsen w/o Metoprolol.  Had ECHO that showed moderate mitral valve regurgitation  Anxiety- 'I don't think I could survive this without the Klonopin'.  Anxiety is worse due to isolation due to COVID.  Pt did counseling this summer but didn't feel like it was the right fit.  Is looking to restart counseling and has phones calls out to various people.  COVID vaccine counseling- pt is very apprehensive about vaccine.  ROS:   See pertinent positives and negatives per HPI.  Patient Active Problem List   Diagnosis Date Noted  . Left inguinal hernia 03/11/2019  . Visit for monitoring Tikosyn therapy 03/29/2018  . Osteoporosis 09/23/2017  . Fatigue 02/18/2017  . Physical exam 04/21/2016  . Anxiety and depression 02/13/2016  . Chronic pain 02/13/2016  . Vertigo 11/16/2015  . B12 deficiency 10/01/2015  . Hearing loss due to cerumen impaction 08/23/2015  . Foot pain, right 05/23/2015  . Hyperlipidemia 04/19/2015  . Protein-calorie malnutrition ([place]) 10/19/2014  . Fracture of multiple ribs 08/07/2014  . Paroxysmal atrial fibrillation ([facility])   . Tachycardia   . Anemia 08/06/2014  . Constipation 07/14/2014  . Lumbar back pain 05/19/2012  . Seasonal allergic rhinitis 11/06/2011  . Neuropathy  11/06/2011  . S/P cervical spinal fusion 11/06/2011  . GERD (gastroesophageal reflux disease) 11/06/2011    Social History   Tobacco Use  . Smoking status: Former Smoker    Packs/day: 0.10    Years: 14.00    Pack years: 1.40    Types: Cigarettes  . Smokeless tobacco: Never Used  . Tobacco comment: 03/31/2018 "quit ~ 1980; someday smoker when I did smoke; never addicted"  Substance Use Topics  . Alcohol use: Never    Current Outpatient Medications:  .  acetaminophen (TYLENOL) 500 MG tablet, Take 1,000 mg by mouth every 6 (six) hours as needed for mild pain. , Disp: , Rfl:  .  Calcium Carbonate Antacid (TUMS CHEWY BITES PO), Take 1 tablet by mouth daily as needed (reflux). , Disp: , Rfl:  .  Calcium Citrate-Vitamin D (CALCIUM + D PO), Take 1 tablet by mouth daily., Disp: , Rfl:  .  clonazePAM (KLONOPIN) 0.5 MG tablet, Take 1 tablet (0.5 mg total) by mouth 2 (two) times daily as needed for anxiety., Disp: 60 tablet, Rfl: 1 .  cyclobenzaprine (FLEXERIL) 10 MG tablet, Take 10 mg by mouth 3 (three) times daily as needed for muscle spasms. , Disp: , Rfl: 5 .  diltiazem (CARDIZEM CD) 300 MG 24 hr capsule, Take 1 capsule (300 mg total) by mouth daily., Disp: 30 capsule, Rfl: 6 .  dofetilide (TIKOSYN) 250 MCG capsule, Take 1 capsule (250 mcg total) by mouth 2 (two) times daily., Disp: 180 capsule, Rfl: 3 .  famotidine (PEPCID) 20 MG tablet, Take 1 tablet (20 mg total) by mouth daily., Disp: 30 tablet,  Rfl: 0 .  furosemide (LASIX) 20 MG tablet, Take 1 tablet (20 mg total) by mouth daily as needed for fluid or edema., Disp: 30 tablet, Rfl: 2 .  gabapentin (NEURONTIN) 100 MG capsule, Take 300 mg by mouth at bedtime. , Disp: , Rfl:  .  ketoconazole (NIZORAL) 2 % cream, ketoconazole 2 % topical cream  APP EXT AA BID, Disp: , Rfl:  .  lactulose (CONSTULOSE) 10 GM/15ML solution, Constulose 10 gram/15 mL oral solution  TAKE 15 TO 30 ML BY MOUTH ONCE DAILY, Disp: , Rfl:  .  Magnesium 200 MG TABS, Take 1  tablet (200 mg total) by mouth daily., Disp: 30 each, Rfl:  .  meclizine (ANTIVERT) 25 MG tablet, Take 1 tablet (25 mg total) by mouth 3 (three) times daily as needed for dizziness., Disp: 45 tablet, Rfl: 0 .  Nystatin POWD, Apply small amount to affected area., Disp: 1 Bottle, Rfl: 3 .  oxyCODONE (ROXICODONE) 5 MG/5ML solution, Take 6.5 mg by mouth every 4 (four) hours as needed for moderate pain. , Disp: , Rfl:  .  polyethylene glycol (MIRALAX / GLYCOLAX) packet, Take 17 g by mouth daily as needed for mild constipation., Disp: , Rfl:  .  potassium chloride (K-DUR) 10 MEQ tablet, Take 1 tablet (10 mEq total) by mouth daily., Disp: 90 tablet, Rfl: 3 .  rivaroxaban (XARELTO) 20 MG TABS tablet, Take 1 tablet (20 mg total) by mouth daily with supper., Disp: 30 tablet, Rfl: 3 .  valACYclovir (VALTREX) 1000 MG tablet, TK 2 TS PO TONIGHT AND 2 TS TOMORROW MORNING UTD, Disp: , Rfl: 0 .  Vilazodone HCl (VIIBRYD) 10 MG TABS, Viibryd 10 mg tablet  TAKE 1 2 (ONE HALF) TABLET BY MOUTH ONCE DAILY FOR 7 DAYS THEN INCREASE TO 1 WHOLE TABLET ONCE DAILY, Disp: , Rfl:   Allergies  Allergen Reactions  . Buprenorphine     Other reaction(s): nausea, sedation and adhesive reaction  . Contrast Media [Iodinated Diagnostic Agents] Hives    unknown  . Cymbalta [Duloxetine Hcl]     Severe diarrhea and upset stomach  . Erythromycin     unknown  . Hydrocodone     Other reaction(s): sedation and nausea  . Hydromorphone     Other reaction(s): sedation and nausea  . Iodine   . Latex     hives Other reaction(s): hives and sores  . Levofloxacin Nausea And Vomiting  . Metrizamide   . Nsaids     Other reaction(s): Other (See Comments) CANNOT TAKE PER CARDIOLOGIST DUE TO AFIB   . Nucynta  [Tapentadol Hcl]     Other reaction(s): nausea and sedation  . Other     CAN NOT TAKE , aLPRAZOLAM, OR ELAVIL DUE TO AFIB FOR ANXIETY  IV CONTRAST /"DYE".  . Oxycodone     Other reaction(s): Delusions (intolerance)  PILLS  ONLY Other reaction(s): sedation and nausea  . Pentazocine     Other reaction(s): Unknown  . Septra [Bactrim]     Hives   . Sulfa Antibiotics     rash  . Sulfasalazine   . Adhesive [Tape] Rash and Other (See Comments)    Heart monitor stickers must be rotated in order to prevent rash  . Morphine Anxiety    Other reaction(s): sedation and nausea    Objective:   There were no vitals taken for this visit.  AAOx3 Pt is able to speak clearly, coherently without shortness of breath or increased work of breathing. Thought process is  tangential.  Mood is anxious.  Intermittently tearful   Assessment and Plan:   Afib- pt is very anxious about the changes cardiology made to her medication.  She has not discussed her concerns with them but wants to know what I think.  I told her that stopping Metoprolol due to hair loss and increased fatigue was a good choice.  Explained that Diltiazem is also a medication used to control heart rate and increasing this in the setting of stopping metoprolol is very reasonable.  Again encouraged her to discuss these things w/ her specialists- particularly if she has concerns or doesn't understand why changes are being made.  Explained that people are less likely to comply w/ changes if they don't understand why they are being made and that compliance with treatment plans is important.  Anxiety- ongoing issue and again pt's biggest hindrance at this time.  She again started crying and saying she 'just couldn't make it' without her Klonopin.  She does this nearly every visit which I find odd, bc we have not discussed stopping her Klonopin for some time.  I have tried nearly every SSRI, Wellbutrin, Buspar and pt has anxiety about taking them or 'side effects' from each.  Now that she is on Tikosyn, we are extremely limited in tx options.  Told her that at this time, I had no plans to stop her Klonopin.  Did discuss that her pain management doctors want her to avoid benzos  and opiates together but that was a universal recommendation and not specific to her (she felt she was being targeted).  Total time spent on today's visit was 42 minutes and greater than 50% of time spent counseling on medications, treatment plans, vaccine recommendations.  COVID vaccine counseling- strongly encouraged pt to get the vaccine.  Reviewed that her previous 'reactions' were expected immune responses after vaccine administration and not actually side effects or reactions.     [name], MD 08/02/2019

## 2019-08-02 NOTE — Telephone Encounter (Signed)
Pt requesting Rx Pepcid  Pt stated aware Rx is OTC. With Rx the cost of medication is lower  As insurance will cover

## 2019-08-04 ENCOUNTER — Telehealth: Payer: Self-pay | Admitting: Cardiology

## 2019-08-04 ENCOUNTER — Telehealth (HOSPITAL_COMMUNITY): Payer: Self-pay | Admitting: *Deleted

## 2019-08-04 MED ORDER — CARVEDILOL 3.125 MG PO TABS: 3.1250 mg | ORAL_TABLET | Freq: Two times a day (BID) | ORAL | 3 refills | Status: DC

## 2019-08-04 MED ORDER — DILTIAZEM HCL ER COATED BEADS 240 MG PO CP24: 240.0000 mg | ORAL_CAPSULE | Freq: Every day | ORAL | 3 refills | Status: DC

## 2019-08-04 NOTE — Telephone Encounter (Signed)
Questions answered.

## 2019-08-04 NOTE — Telephone Encounter (Signed)
New Message    Pt is calling and is asking for Sherri to call her back, she says it is in regards to her last visit. She states she doesn't remember everything that was gone over and has questions.    Please call

## 2019-08-04 NOTE — Telephone Encounter (Signed)
Pt called in stating since [name] changed her medication to stop metoprolol due to hair loss she is having issues with her heart "doing funny things" in the morning. She experienced this same feeling the last time she stopped metoprolol but it resolved when metoprolol was restarted. Pt unsure of what to do b/c she doesn't want to lose her hair but cannot take the funny feeling in the heart. Discussed with Pharm-D and [name] - will try coreg 3.125mg  BID and decrease cardizem to 240mg  (pt reported having issues swallowing 300mg  dose). Pt in agreement.

## 2019-08-08 ENCOUNTER — Telehealth: Payer: Self-pay | Admitting: *Deleted

## 2019-08-08 MED ORDER — FAMOTIDINE 20 MG PO TABS: 20.0000 mg | ORAL_TABLET | Freq: Every day | ORAL | 1 refills | Status: DC

## 2019-08-08 NOTE — Telephone Encounter (Signed)
Called and informed pt. She stated an understanding.

## 2019-08-08 NOTE — Telephone Encounter (Signed)
Pt will like information about Covi-19 Vaccination  Pt has multiple allergic reaction, her main concern is been allergic to Sulfa an  IV Dye  Stated want to make an appointment for the vaccination if is ok for her to do so been allergic.

## 2019-08-08 NOTE — Telephone Encounter (Signed)
This was discussed during her visit.  There is no sulfa or IV dye as part of the vaccine.  She should get the vaccine in order to protect herself from the potentially deadly COVID virus

## 2019-08-08 NOTE — Addendum Note (Signed)
Addended by: [name] on: 08/08/2019 07:53 AM   Modules accepted: Orders

## 2019-08-08 NOTE — Telephone Encounter (Signed)
Please advise 

## 2019-08-08 NOTE — Telephone Encounter (Signed)
Prescription sent to local pharmacy

## 2019-08-11 ENCOUNTER — Telehealth: Payer: Self-pay | Admitting: *Deleted

## 2019-08-11 NOTE — Telephone Encounter (Signed)
Pt made aware and agreeable to plan.

## 2019-08-11 NOTE — Telephone Encounter (Signed)
-----   Message from Will Meredith Leeds, MD sent at 08/11/2019  8:01 AM EST ----- Regarding: RE: ?? TEE TEE not urgent. Would see back in 6 months and will likely order at that time. ----- Message ----- From: Stanton Kidney, RN Sent: 08/09/2019   4:35 PM EST To: Constance Haw, MD Subject: ?? TEE                                         Does she need TEE?

## 2019-08-17 ENCOUNTER — Telehealth (HOSPITAL_COMMUNITY): Payer: Self-pay | Admitting: *Deleted

## 2019-08-17 MED ORDER — METOPROLOL SUCCINATE ER 25 MG PO TB24: 12.5000 mg | ORAL_TABLET | Freq: Two times a day (BID) | ORAL | Status: DC

## 2019-08-17 NOTE — Telephone Encounter (Signed)
Patient called in stating she would like to go back on metoprolol despite the hair loss as this keeps her heart the best regulated (which reduces her anxiety).  She is taking biotin daily OTC to help with hair loss. She will stop coreg today and resume metoprolol succinate 12.5mg  BID tonight. She will continue cardizem at 240mg  once a day. Pt in agreement with this plan.

## 2019-08-19 ENCOUNTER — Encounter (HOSPITAL_COMMUNITY): Payer: Self-pay

## 2019-08-19 ENCOUNTER — Other Ambulatory Visit: Payer: Self-pay

## 2019-08-19 ENCOUNTER — Encounter (HOSPITAL_COMMUNITY): Payer: Self-pay | Admitting: Physician Assistant

## 2019-08-19 ENCOUNTER — Ambulatory Visit (HOSPITAL_BASED_OUTPATIENT_CLINIC_OR_DEPARTMENT_OTHER)
Admission: RE | Admit: 2019-08-19 | Discharge: 2019-08-19 | Disposition: A | Discharge status: Home / Self Care | Payer: Medicare Other | Source: Ambulatory Visit | Attending: Physician Assistant | Admitting: Physician Assistant

## 2019-08-19 ENCOUNTER — Ambulatory Visit (HOSPITAL_COMMUNITY)
Admission: EM | Admit: 2019-08-19 | Discharge: 2019-08-19 | Disposition: A | Discharge status: Home / Self Care | Payer: Medicare Other | Attending: Emergency Medicine | Admitting: Emergency Medicine

## 2019-08-19 VITALS — BP 128/70 | HR 85 | Ht 66.0 in | Wt 125.2 lb

## 2019-08-19 DIAGNOSIS — N390 Urinary tract infection, site not specified: Secondary | ICD-10-CM | POA: Diagnosis not present

## 2019-08-19 DIAGNOSIS — I480 Paroxysmal atrial fibrillation: Secondary | ICD-10-CM

## 2019-08-19 DIAGNOSIS — D6869 Other thrombophilia: Secondary | ICD-10-CM

## 2019-08-19 MED ORDER — CEPHALEXIN 500 MG PO CAPS: 500.0000 mg | ORAL_CAPSULE | Freq: Four times a day (QID) | ORAL | 0 refills | Status: AC

## 2019-08-19 NOTE — Progress Notes (Signed)
Primary Care Physician: [name], MD Referring Physician: Dr. Jordan Likes [name] is a 74 y.o. female with a h/o paroxysmal afib that presents for follow up in the AF clinic. Patient recently had complaints of hair loss and so her BB was stopped and diltiazem increased. However, she began having increased palpitations. Patient was started on Coreg but she did not tolerate this well. Ultimately, she was restarted on metoprolol. She continues on dofetilide 250 mcg bid and xarelto 20 mg daily for a CHA2DS2VASc score of 3. Patient reports that overall she has done well since resuming metoprolol. At times of increased stress, she does have some palpitations which quickly subside.   Today, she denies symptoms of chest pain, shortness of breath, orthopnea, PND, lower extremity edema, dizziness, presyncope, syncope, or neurologic sequela. The patient is tolerating medications without difficulties and is otherwise without complaint today.   Past Medical History:  Diagnosis Date  . Anxiety   . Arthritis    "maybe in my back" (03/31/2018)  . Benign paroxysmal positional vertigo 06/08/2013  . Fracture of multiple ribs 2015   "don't know from what; dx'd when I in hospital for 1st back OR" (03/31/2018)  . GERD (gastroesophageal reflux disease)   . Hair loss 04/12/2012  . Herpes   . History of blood transfusion    "twice; related to back OR" (03/31/2018)  . History of kidney stones   . Interstitial cystitis 11/06/2011  . 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 Osteopenia 02/18/2012  . Osteoporosis   . PAF (paroxysmal atrial fibrillation) ([place]) 2012  . Peripheral neuropathy 11/06/2011  . PMDD (premenstrual dysphoric disorder)   . Seasonal allergies   . Vaginal delivery    ONE NSVD  . 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 ANTERIOR CERVICAL DECOMP/DISCECTOMY FUSION  ~ 2003  . BACK SURGERY    . BREAST SURGERY     BREAST BIOPSY--RIGHT BENIGN  .  BUNIONECTOMY Bilateral   . COSMETIC SURGERY  2016   "back of my neck; related to earlier fusion"  . CYSTOSCOPY W/ STONE MANIPULATION  "several times"  . DILATION AND CURETTAGE OF UTERUS    . FOREHEAD RECONSTRUCTION Right    "removed bone protruding out of my forehead"  . HARDWARE REMOVAL  2016   "related to neck OR"  . INCONTINENCE SURGERY    . POSTERIOR CERVICAL FUSION/FORAMINOTOMY  ~ 2008; 2015  . SHOULDER ARTHROSCOPY W/ ROTATOR CUFF REPAIR Right 2012  . SPINAL FUSION  06/2014 - 2018 X ?7   "scoliosis; my entire back"  . TUBAL LIGATION    . VAGINAL HYSTERECTOMY     [facility]    Current Outpatient Medications  Medication Sig Dispense Refill  . acetaminophen (TYLENOL) 500 MG tablet Take 1,000 mg by mouth every 6 (six) hours as needed for mild pain.     . Calcium Carbonate Antacid (TUMS CHEWY BITES PO) Take 1 tablet by mouth daily as needed (reflux).     . Calcium Citrate-Vitamin D (CALCIUM + D PO) Take 1 tablet by mouth daily.    . clonazePAM (KLONOPIN) 0.5 MG tablet Take 1 tablet (0.5 mg total) by mouth 2 (two) times daily as needed for anxiety. 60 tablet 1  . cyclobenzaprine (FLEXERIL) 10 MG tablet Take 10 mg by mouth 3 (three) times daily as needed for muscle spasms.   5  . diltiazem (CARDIZEM CD) 240 MG 24 hr capsule Take  1 capsule (240 mg total) by mouth daily. 30 capsule 3  . dofetilide (TIKOSYN) 250 MCG capsule Take 1 capsule (250 mcg total) by mouth 2 (two) times daily. 180 capsule 3  . famotidine (PEPCID) 20 MG tablet Take 1 tablet (20 mg total) by mouth daily. 90 tablet 1  . furosemide (LASIX) 20 MG tablet Take 1 tablet (20 mg total) by mouth daily as needed for fluid or edema. 30 tablet 2  . gabapentin (NEURONTIN) 100 MG capsule Take 300 mg by mouth at bedtime.     [name] ketoconazole (NIZORAL) 2 % cream ketoconazole 2 % topical cream  APP EXT AA BID    . lactulose (CONSTULOSE) 10 GM/15ML solution Constulose 10 gram/15 mL oral solution  TAKE 15 TO 30 ML BY MOUTH ONCE DAILY    .  Magnesium 200 MG TABS Take 1 tablet (200 mg total) by mouth daily. 30 each   . meclizine (ANTIVERT) 25 MG tablet Take 1 tablet (25 mg total) by mouth 3 (three) times daily as needed for dizziness. 45 tablet 0  . metoprolol succinate (TOPROL XL) 25 MG 24 hr tablet Take 0.5 tablets (12.5 mg total) by mouth 2 (two) times daily.    [name] Nystatin POWD Apply small amount to affected area. 1 Bottle 3  . oxyCODONE (ROXICODONE) 5 MG/5ML solution Take 6.5 mg by mouth every 4 (four) hours as needed for moderate pain.     . polyethylene glycol (MIRALAX / GLYCOLAX) packet Take 17 g by mouth daily as needed for mild constipation.    . potassium chloride (K-DUR) 10 MEQ tablet Take 1 tablet (10 mEq total) by mouth daily. 90 tablet 3  . rivaroxaban (XARELTO) 20 MG TABS tablet Take 1 tablet (20 mg total) by mouth daily with supper. 30 tablet 3  . valACYclovir (VALTREX) 1000 MG tablet TK 2 TS PO TONIGHT AND 2 TS TOMORROW MORNING UTD  0  . Vilazodone HCl (VIIBRYD) 10 MG TABS Viibryd 10 mg tablet  TAKE 1 2 (ONE HALF) TABLET BY MOUTH ONCE DAILY FOR 7 DAYS THEN INCREASE TO 1 WHOLE TABLET ONCE DAILY     No current facility-administered medications for this visit.    Allergies  Allergen Reactions  . Buprenorphine     Other reaction(s): nausea, sedation and adhesive reaction  . Contrast Media [Iodinated Diagnostic Agents] Hives    unknown  . Cymbalta [Duloxetine Hcl]     Severe diarrhea and upset stomach  . Erythromycin     unknown  . Hydrocodone     Other reaction(s): sedation and nausea  . Hydromorphone     Other reaction(s): sedation and nausea  . Iodine   . Latex     hives Other reaction(s): hives and sores  . Levofloxacin Nausea And Vomiting  . Metrizamide   . Nsaids     Other reaction(s): Other (See Comments) CANNOT TAKE PER CARDIOLOGIST DUE TO AFIB   . Nucynta  [Tapentadol Hcl]     Other reaction(s): nausea and sedation  . Other     CAN NOT TAKE , aLPRAZOLAM, OR ELAVIL DUE TO AFIB FOR ANXIETY   IV CONTRAST /"DYE".  . Oxycodone     Other reaction(s): Delusions (intolerance)  PILLS ONLY Other reaction(s): sedation and nausea  . Pentazocine     Other reaction(s): Unknown  . Septra [Bactrim]     Hives   . Sulfa Antibiotics     rash  . Sulfasalazine   . Adhesive [Tape] Rash and Other (See Comments)  Heart monitor stickers must be rotated in order to prevent rash  . Morphine Anxiety    Other reaction(s): sedation and nausea    Social History   Socioeconomic History  . Marital status: Divorced    Spouse name: Not on file  . Number of children: Not on file  . Years of education: Not on file  . Highest education level: Not on file  Occupational History  . Not on file  Tobacco Use  . Smoking status: Former Smoker    Packs/day: 0.10    Years: 14.00    Pack years: 1.40    Types: Cigarettes  . Smokeless tobacco: Never Used  . Tobacco comment: 03/31/2018 "quit ~ 1980; someday smoker when I did smoke; never addicted"  Substance and Sexual Activity  . Alcohol use: Never  . Drug use: Yes    Types: Oxycodone    Comment: 03/31/2018 "for chronic neck and back pain"  . Sexual activity: Not Currently  Other Topics Concern  . Not on file  Social History Narrative  . Not on file   Social Determinants of Health   Financial Resource Strain:   . Difficulty of Paying Living Expenses: Not on file  Food Insecurity:   . Worried About Charity fundraiser in the Last Year: Not on file  . Ran Out of Food in the Last Year: Not on file  Transportation Needs:   . Lack of Transportation (Medical): Not on file  . Lack of Transportation (Non-Medical): Not on file  Physical Activity:   . Days of Exercise per Week: Not on file  . Minutes of Exercise per Session: Not on file  Stress:   . Feeling of Stress : Not on file  Social Connections:   . Frequency of Communication with Friends and Family: Not on file  . Frequency of Social Gatherings with Friends and Family: Not on file  .  Attends Religious Services: Not on file  . Active Member of Clubs or Organizations: Not on file  . Attends Archivist Meetings: Not on file  . Marital Status: Not on file  Intimate Partner Violence:   . Fear of Current or Ex-Partner: Not on file  . Emotionally Abused: Not on file  . Physically Abused: Not on file  . Sexually Abused: Not on file    Family History  Problem Relation Age of Onset  . Diabetes Father   . Hyperlipidemia Sister   . Heart disease Sister   . Stroke Sister   . Diabetes Brother   . Hyperlipidemia Sister   . Heart disease Sister   . Arthritis Mother   . Heart disease Mother   . Uterine cancer Mother   . Diabetes Brother   . Heart Problems Brother     ROS- All systems are reviewed and negative except as per the HPI above  Physical Exam: There were no vitals filed for this visit. Wt Readings from Last 3 Encounters:  08/01/19 124 lb 12.8 oz (56.6 kg)  05/18/19 125 lb (56.7 kg)  04/25/19 124 lb (56.2 kg)    Labs: Lab Results  Component Value Date   NA 141 05/18/2019   K 4.0 05/18/2019   CL 103 05/18/2019   CO2 25 05/18/2019   GLUCOSE 105 (H) 05/18/2019   BUN 10 05/18/2019   CREATININE 0.65 05/18/2019   CALCIUM 9.1 05/18/2019   MG 2.2 05/18/2019   Lab Results  Component Value Date   INR 0.93 04/24/2011   Lab Results  Component Value Date   CHOL 237 (H) 09/23/2017   HDL 100.90 09/23/2017   LDLCALC 116 (H) 09/23/2017   TRIG 101.0 09/23/2017    GEN- The patient is well appearing elderly female, alert and oriented x 3 today.   HEENT-head normocephalic, atraumatic, sclera clear, conjunctiva pink, hearing intact, trachea midline. Lungs- Clear to ausculation bilaterally, normal work of breathing Heart- Regular rate and rhythm, no rubs or gallops. 2/6 systolic murmur GI- soft, NT, ND, + BS Extremities- no clubbing, cyanosis, or edema MS- no significant deformity or atrophy Skin- no rash or lesion Psych- euthymic mood, full affect  Neuro- strength and sensation are intact   EKG- SR HR 85, PR 160, QRS 70, QTc 466  Epic records reviewed  Assessment and Plan:  1. Paroxysmal Afib Patient appears to be maintaining SR. Continue Toprol 12.5 mg daily Continue Cardizem 240 mg daily Continue dofetilide 250 mcg bid, QT stable.  2. CHA2DS2VASc score of 3 Continue Xarelto 20 mg daily.   Follow up with Dr Curt Bears per recall.   [facility] [address] [place], [place] [address] [phone]

## 2019-08-19 NOTE — ED Provider Notes (Signed)
[place]    CSN: [id number] Arrival date & time: 08/19/19  1220      History   Chief Complaint Chief Complaint  Patient presents with  . Urinary Tract Infection    HPI [name] is a 74 y.o. female.   [name] presents with complaints of burning with urination as well as frequency. Started approximately 3 days ago. No blood in urine. No fevers. No new back pain. No abdominal pain. No gi symptoms. Has had similar in the past with UTI.    ROS per HPI, negative if not otherwise mentioned.      Past Medical History:  Diagnosis Date  . Anxiety   . Arthritis    "maybe in my back" (03/31/2018)  . Benign paroxysmal positional vertigo 06/08/2013  . Fracture of multiple ribs 2015   "don't know from what; dx'd when I in hospital for 1st back OR" (03/31/2018)  . GERD (gastroesophageal reflux disease)   . Hair loss 04/12/2012  . Herpes   . History of blood transfusion    "twice; related to back OR" (03/31/2018)  . History of kidney stones   . Interstitial cystitis 11/06/2011  . 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 Osteopenia 02/18/2012  . Osteoporosis   . PAF (paroxysmal atrial fibrillation) ([place]) 2012  . Peripheral neuropathy 11/06/2011  . PMDD (premenstrual dysphoric disorder)   . Seasonal allergies   . Vaginal delivery    ONE NSVD  . 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 Secondary hypercoagulable state ([place]) 08/19/2019  . Left inguinal hernia 03/11/2019  . Visit for monitoring Tikosyn therapy 03/29/2018  . Osteoporosis 09/23/2017  . Fatigue 02/18/2017  . Physical exam 04/21/2016  . Anxiety and depression 02/13/2016  . Chronic pain 02/13/2016  . Vertigo 11/16/2015  . B12 deficiency 10/01/2015  . Hearing loss due to cerumen impaction 08/23/2015  . Foot pain, right 05/23/2015  . Hyperlipidemia 04/19/2015  . Protein-calorie malnutrition ([place]) 10/19/2014  . Fracture of multiple ribs 08/07/2014  .  Paroxysmal atrial fibrillation ([facility])   . Tachycardia   . Anemia 08/06/2014  . Constipation 07/14/2014  . Lumbar back pain 05/19/2012  . Seasonal allergic rhinitis 11/06/2011  . Neuropathy 11/06/2011  . S/P cervical spinal fusion 11/06/2011  . GERD (gastroesophageal reflux disease) 11/06/2011    Past Surgical History:  Procedure Laterality Date  . ANTERIOR CERVICAL DECOMP/DISCECTOMY FUSION  ~ 2003  . BACK SURGERY    . BREAST SURGERY     BREAST BIOPSY--RIGHT BENIGN  . BUNIONECTOMY Bilateral   . COSMETIC SURGERY  2016   "back of my neck; related to earlier fusion"  . CYSTOSCOPY W/ STONE MANIPULATION  "several times"  . DILATION AND CURETTAGE OF UTERUS    . FOREHEAD RECONSTRUCTION Right    "removed bone protruding out of my forehead"  . HARDWARE REMOVAL  2016   "related to neck OR"  . INCONTINENCE SURGERY    . POSTERIOR CERVICAL FUSION/FORAMINOTOMY  ~ 2008; 2015  . SHOULDER ARTHROSCOPY W/ ROTATOR CUFF REPAIR Right 2012  . SPINAL FUSION  06/2014 - 2018 X ?7   "scoliosis; my entire back"  . TUBAL LIGATION    . VAGINAL HYSTERECTOMY     TVH    OB History    Gravida  1   Para  1   Term  1   Preterm      AB  Living  1     SAB      TAB      Ectopic      Multiple      Live Births  1            Home Medications    Prior to Admission medications   Medication Sig Start Date End Date Taking? Authorizing Provider  acetaminophen (TYLENOL) 500 MG tablet Take 1,000 mg by mouth every 6 (six) hours as needed for mild pain.     [provider]  Calcium Carbonate Antacid (TUMS CHEWY BITES PO) Take 1 tablet by mouth daily as needed (reflux).     [provider]  Calcium Citrate-Vitamin D (CALCIUM + D PO) Take 1 tablet by mouth daily.    [provider]  cephALEXin (KEFLEX) 500 MG capsule Take 1 capsule (500 mg total) by mouth 4 (four) times daily for 7 days. 08/19/19 08/26/19  Zigmund Gottron, NP  clonazePAM (KLONOPIN) 0.5 MG tablet Take  1 tablet (0.5 mg total) by mouth 2 (two) times daily as needed for anxiety. 08/02/19   [name], MD  cyclobenzaprine (FLEXERIL) 10 MG tablet Take 10 mg by mouth 3 (three) times daily as needed for muscle spasms.  12/10/17   [provider]  diltiazem (CARDIZEM CD) 240 MG 24 hr capsule Take 1 capsule (240 mg total) by mouth daily. 08/04/19   Sherran Needs, NP  dofetilide (TIKOSYN) 250 MCG capsule Take 1 capsule (250 mcg total) by mouth 2 (two) times daily. 08/01/19   Sherran Needs, NP  famotidine (PEPCID) 20 MG tablet Take 1 tablet (20 mg total) by mouth daily. 08/08/19   [name], MD  furosemide (LASIX) 20 MG tablet Take 1 tablet (20 mg total) by mouth daily as needed for fluid or edema. 08/01/19   Camnitz, Ocie Doyne, MD  gabapentin (NEURONTIN) 100 MG capsule Take 300 mg by mouth at bedtime.     [provider]  ketoconazole (NIZORAL) 2 % cream ketoconazole 2 % topical cream  APP EXT AA BID    [provider]  lactulose (CONSTULOSE) 10 GM/15ML solution Constulose 10 gram/15 mL oral solution  TAKE 15 TO 30 ML BY MOUTH ONCE DAILY    [provider]  Magnesium 200 MG TABS Take 1 tablet (200 mg total) by mouth daily. 07/26/18   Sherran Needs, NP  meclizine (ANTIVERT) 25 MG tablet Take 1 tablet (25 mg total) by mouth 3 (three) times daily as needed for dizziness. 07/09/17   [name], MD  metoprolol succinate (TOPROL XL) 25 MG 24 hr tablet Take 0.5 tablets (12.5 mg total) by mouth 2 (two) times daily. 08/17/19   Sherran Needs, NP  Nystatin POWD Apply small amount to affected area. 12/08/17   Princess Bruins, MD  oxyCODONE (ROXICODONE) 5 MG/5ML solution Take 6.5 mg by mouth every 4 (four) hours as needed for moderate pain.  10/31/15   [provider]  polyethylene glycol (MIRALAX / GLYCOLAX) packet Take 17 g by mouth daily as needed for mild constipation.    [provider]  potassium chloride (K-DUR) 10 MEQ tablet  Take 1 tablet (10 mEq total) by mouth daily. 11/23/18   Evans Lance, MD  rivaroxaban (XARELTO) 20 MG TABS tablet Take 1 tablet (20 mg total) by mouth daily with supper. 01/11/19   Camnitz, Will Hassell Done, MD  valACYclovir (VALTREX) 1000 MG tablet TK 2 TS PO TONIGHT AND 2 TS  TOMORROW MORNING UTD 06/09/18   [provider]  Vilazodone HCl (VIIBRYD) 10 MG TABS Viibryd 10 mg tablet  TAKE 1 2 (ONE HALF) TABLET BY MOUTH ONCE DAILY FOR 7 DAYS THEN INCREASE TO 1 WHOLE TABLET ONCE DAILY    [provider]    Family History Family History  Problem Relation Age of Onset  . Diabetes Father   . Hyperlipidemia Sister   . Heart disease Sister   . Stroke Sister   . Diabetes Brother   . Hyperlipidemia Sister   . Heart disease Sister   . Arthritis Mother   . Heart disease Mother   . Uterine cancer Mother   . Diabetes Brother   . Heart Problems Brother     Social History Social History   Tobacco Use  . Smoking status: Former Smoker    Packs/day: 0.10    Years: 14.00    Pack years: 1.40    Types: Cigarettes  . Smokeless tobacco: Never Used  . Tobacco comment: 03/31/2018 "quit ~ 1980; someday smoker when I did smoke; never addicted"  Substance Use Topics  . Alcohol use: Never  . Drug use: Yes    Types: Oxycodone    Comment: 03/31/2018 "for chronic neck and back pain"     Allergies   Buprenorphine, Contrast media [iodinated diagnostic agents], Cymbalta [duloxetine hcl], Erythromycin, Hydrocodone, Hydromorphone, Iodine, Latex, Levofloxacin, Metrizamide, Nsaids, Nucynta  [tapentadol hcl], Other, Oxycodone, Pentazocine, Septra [bactrim], Sulfa antibiotics, Sulfasalazine, Adhesive [tape], and Morphine   Review of Systems Review of Systems   Physical Exam Triage Vital Signs ED Triage Vitals  Enc Vitals Group     BP 08/19/19 1300 134/63     Pulse Rate 08/19/19 1300 74     Resp 08/19/19 1300 18     Temp 08/19/19 1300 98 F (36.7 C)     Temp Source 08/19/19 1300 Oral      SpO2 08/19/19 1300 98 %     Weight 08/19/19 1259 124 lb (56.2 kg)     Height --      Head Circumference --      Peak Flow --      Pain Score 08/19/19 1257 0     Pain Loc --      Pain Edu? --      Excl. in [place]? --    No data found.  Updated Vital Signs BP 134/63 (BP Location: Right Arm)   Pulse 74   Temp 98 F (36.7 C) (Oral)   Resp 18   Wt 124 lb (56.2 kg)   SpO2 98%   BMI 20.01 kg/m    Physical Exam Constitutional:      General: [name] is not in acute distress.    Appearance: [name] is well-developed.  Cardiovascular:     Rate and Rhythm: Normal rate.  Pulmonary:     Effort: Pulmonary effort is normal.  Abdominal:     Tenderness: There is no abdominal tenderness. There is no right CVA tenderness or left CVA tenderness.  Skin:    General: Skin is warm and dry.  Neurological:     Mental Status: [name] is alert and oriented to person, place, and time.      UC Treatments / Results  Labs (all labs ordered are listed, but only abnormal results are displayed) Labs Reviewed  URINE CULTURE    EKG   Radiology No results found.  Procedures Procedures (including critical care time)  Medications Ordered in UC Medications - No data to  display  Initial Impression / Assessment and Plan / UC Course  I have reviewed the triage vital signs and the nursing notes.  Pertinent labs & imaging results that were available during my care of the patient were reviewed by me and considered in my medical decision making (see chart for details).     Urine dip grossly abnormal so automatic machine kicked out sample. Consistent with uti with culture to confirm. Antibiotics initiated. Return precautions provided. Patient verbalized understanding and agreeable to plan.   Final Clinical Impressions(s) / UC Diagnoses   Final diagnoses:  Lower urinary tract infectious disease     Discharge Instructions     Drink plenty of water to empty bladder regularly. Avoid alcohol and caffeine as  these may irritate the bladder.  Complete course of antibiotics.  I have sent your urine to be cultured and would call you if we need to adjust your medications.     ED Prescriptions    Medication Sig Dispense Auth. Provider   cephALEXin (KEFLEX) 500 MG capsule Take 1 capsule (500 mg total) by mouth 4 (four) times daily for 7 days. 28 capsule Zigmund Gottron, NP     PDMP not reviewed this encounter.   Zigmund Gottron, NP 08/19/19 [phone]

## 2019-08-19 NOTE — ED Triage Notes (Signed)
Pt is here with a UTI that started 3 days ago, pt has NOT taken any meds to relieve discomfort. She constantly has the urge to urinate with burning sensation.

## 2019-08-19 NOTE — ED Notes (Signed)
Urinalysis testing exceeded the limitations of the [employer] capabilities. Notified provider. Urine culture ordered 

## 2019-08-19 NOTE — Discharge Instructions (Signed)
Drink plenty of water to empty bladder regularly. Avoid alcohol and caffeine as these may irritate the bladder.  Complete course of antibiotics.  I have sent your urine to be cultured and would call you if we need to adjust your medications.

## 2019-08-21 LAB — URINE CULTURE: Culture: >=100000 — AB

## 2019-08-28 ENCOUNTER — Other Ambulatory Visit: Payer: Self-pay | Admitting: Nurse Practitioner

## 2019-08-29 NOTE — Telephone Encounter (Signed)
Prescription refill request for Xarelto received.   Last office visit: 08/19/2019, [place] Weight: 56.2kg Age: 74 y.o. Scr: 0.65, 05/18/2019 CrCl: 66 ml/min   Prescription refill sent.

## 2019-08-31 ENCOUNTER — Emergency Department (INDEPENDENT_AMBULATORY_CARE_PROVIDER_SITE_OTHER)
Admission: EM | Admit: 2019-08-31 | Discharge: 2019-08-31 | Disposition: A | Discharge status: Home / Self Care | Payer: Medicare Other | Source: Home / Self Care

## 2019-08-31 ENCOUNTER — Other Ambulatory Visit: Payer: Self-pay

## 2019-08-31 ENCOUNTER — Encounter: Payer: Self-pay | Admitting: Emergency Medicine

## 2019-08-31 DIAGNOSIS — R300 Dysuria: Secondary | ICD-10-CM | POA: Diagnosis not present

## 2019-08-31 DIAGNOSIS — N390 Urinary tract infection, site not specified: Secondary | ICD-10-CM | POA: Diagnosis not present

## 2019-08-31 LAB — POCT URINALYSIS DIP (MANUAL ENTRY)
Bilirubin, UA: NEGATIVE
Glucose, UA: NEGATIVE mg/dL
Ketones, POC UA: NEGATIVE mg/dL
Nitrite, UA: NEGATIVE
Protein Ur, POC: NEGATIVE mg/dL
Spec Grav, UA: 1.025 (ref 1.010–1.025)
Urobilinogen, UA: 0.2 E.U./dL
pH, UA: 5.5 (ref 5.0–8.0)

## 2019-08-31 MED ORDER — CIPROFLOXACIN HCL 250 MG PO TABS: 250.0000 mg | ORAL_TABLET | Freq: Two times a day (BID) | ORAL | 0 refills | Status: DC

## 2019-08-31 NOTE — ED Triage Notes (Signed)
C/o continued dysuria and fatigue Seen at [facility] in [place] 2 weeks ago for dysuria per pt

## 2019-08-31 NOTE — ED Provider Notes (Signed)
[name]    CSN: [id number] Arrival date & time: 08/31/19  1854      History   Chief Complaint No chief complaint on file.   HPI [name] is a 74 y.o. female.  Patient was seen on February 12 with symptoms of lower urinary tract infection.  Culture at that time showed greater than 100,000 E. coli.  She was given Keflex which showed an intermediate sensitivity.  She says that she still feels not quite right with some dysuria and tiredness.   HPI  Past Medical History:  Diagnosis Date  . Anxiety   . Arthritis    "maybe in my back" (03/31/2018)  . Benign paroxysmal positional vertigo 06/08/2013  . Fracture of multiple ribs 2015   "don't know from what; dx'd when I in hospital for 1st back OR" (03/31/2018)  . GERD (gastroesophageal reflux disease)   . Hair loss 04/12/2012  . Herpes   . History of blood transfusion    "twice; related to back OR" (03/31/2018)  . History of kidney stones   . Interstitial cystitis 11/06/2011  . 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 Osteopenia 02/18/2012  . Osteoporosis   . PAF (paroxysmal atrial fibrillation) ([place]) 2012  . Peripheral neuropathy 11/06/2011  . PMDD (premenstrual dysphoric disorder)   . Seasonal allergies   . Vaginal delivery    ONE NSVD  . 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 Secondary hypercoagulable state ([place]) 08/19/2019  . Left inguinal hernia 03/11/2019  . Visit for monitoring Tikosyn therapy 03/29/2018  . Osteoporosis 09/23/2017  . Fatigue 02/18/2017  . Physical exam 04/21/2016  . Anxiety and depression 02/13/2016  . Chronic pain 02/13/2016  . Vertigo 11/16/2015  . B12 deficiency 10/01/2015  . Hearing loss due to cerumen impaction 08/23/2015  . Foot pain, right 05/23/2015  . Hyperlipidemia 04/19/2015  . Protein-calorie malnutrition ([place]) 10/19/2014  . Fracture of multiple ribs 08/07/2014  . Paroxysmal atrial fibrillation ([facility])   . Tachycardia   .  Anemia 08/06/2014  . Constipation 07/14/2014  . Lumbar back pain 05/19/2012  . Seasonal allergic rhinitis 11/06/2011  . Neuropathy 11/06/2011  . S/P cervical spinal fusion 11/06/2011  . GERD (gastroesophageal reflux disease) 11/06/2011    Past Surgical History:  Procedure Laterality Date  . ANTERIOR CERVICAL DECOMP/DISCECTOMY FUSION  ~ 2003  . BACK SURGERY    . BREAST SURGERY     BREAST BIOPSY--RIGHT BENIGN  . BUNIONECTOMY Bilateral   . COSMETIC SURGERY  2016   "back of my neck; related to earlier fusion"  . CYSTOSCOPY W/ STONE MANIPULATION  "several times"  . DILATION AND CURETTAGE OF UTERUS    . FOREHEAD RECONSTRUCTION Right    "removed bone protruding out of my forehead"  . HARDWARE REMOVAL  2016   "related to neck OR"  . INCONTINENCE SURGERY    . POSTERIOR CERVICAL FUSION/FORAMINOTOMY  ~ 2008; 2015  . SHOULDER ARTHROSCOPY W/ ROTATOR CUFF REPAIR Right 2012  . SPINAL FUSION  06/2014 - 2018 X ?7   "scoliosis; my entire back"  . TUBAL LIGATION    . VAGINAL HYSTERECTOMY     TVH    OB History    Gravida  1   Para  1   Term  1   Preterm      AB      Living  1     SAB  TAB      Ectopic      Multiple      Live Births  1            Home Medications    Prior to Admission medications   Medication Sig Start Date End Date Taking? Authorizing Provider  clonazePAM (KLONOPIN) 0.5 MG tablet Take 1 tablet (0.5 mg total) by mouth 2 (two) times daily as needed for anxiety. 08/02/19  Yes [name], MD  diltiazem (CARDIZEM CD) 240 MG 24 hr capsule Take 1 capsule (240 mg total) by mouth daily. 08/04/19  Yes Sherran Needs, NP  dofetilide (TIKOSYN) 250 MCG capsule Take 1 capsule (250 mcg total) by mouth 2 (two) times daily. 08/01/19  Yes Sherran Needs, NP  famotidine (PEPCID) 20 MG tablet Take 1 tablet (20 mg total) by mouth daily. 08/08/19  Yes [name], MD  furosemide (LASIX) 20 MG tablet Take 1 tablet (20 mg total) by mouth daily as  needed for fluid or edema. 08/01/19  Yes Camnitz, Will Hassell Done, MD  gabapentin (NEURONTIN) 100 MG capsule Take 300 mg by mouth at bedtime.    Yes [provider]  lactulose (CONSTULOSE) 10 GM/15ML solution Constulose 10 gram/15 mL oral solution  TAKE 15 TO 30 ML BY MOUTH ONCE DAILY   Yes [provider]  Magnesium 200 MG TABS Take 1 tablet (200 mg total) by mouth daily. 07/26/18  Yes Sherran Needs, NP  meclizine (ANTIVERT) 25 MG tablet Take 1 tablet (25 mg total) by mouth 3 (three) times daily as needed for dizziness. 07/09/17  Yes [name], MD  metoprolol succinate (TOPROL XL) 25 MG 24 hr tablet Take 0.5 tablets (12.5 mg total) by mouth 2 (two) times daily. 08/17/19  Yes Sherran Needs, NP  oxyCODONE (ROXICODONE) 5 MG/5ML solution Take 6.5 mg by mouth every 4 (four) hours as needed for moderate pain.  10/31/15  Yes [provider]  polyethylene glycol (MIRALAX / GLYCOLAX) packet Take 17 g by mouth daily as needed for mild constipation.   Yes [provider]  potassium chloride (K-DUR) 10 MEQ tablet Take 1 tablet (10 mEq total) by mouth daily. 11/23/18  Yes Evans Lance, MD  XARELTO 20 MG TABS tablet TAKE 1 TABLET BY MOUTH ONCE DAILY WITH SUPPER 08/29/19  Yes Sherran Needs, NP  acetaminophen (TYLENOL) 500 MG tablet Take 1,000 mg by mouth every 6 (six) hours as needed for mild pain.     [provider]  Calcium Carbonate Antacid (TUMS CHEWY BITES PO) Take 1 tablet by mouth daily as needed (reflux).     [provider]  Calcium Citrate-Vitamin D (CALCIUM + D PO) Take 1 tablet by mouth daily.    [provider]  ciprofloxacin (CIPRO) 250 MG tablet Take 1 tablet (250 mg total) by mouth every 12 (twelve) hours. 08/31/19   Wardell Honour, MD  cyclobenzaprine (FLEXERIL) 10 MG tablet Take 10 mg by mouth 3 (three) times daily as needed for muscle spasms.  12/10/17   [provider]  ketoconazole (NIZORAL) 2 % cream  ketoconazole 2 % topical cream  APP EXT AA BID    [provider]  Nystatin POWD Apply small amount to affected area. 12/08/17   Princess Bruins, MD  valACYclovir (VALTREX) 1000 MG tablet TK 2 TS PO TONIGHT AND 2 TS TOMORROW MORNING UTD 06/09/18   [provider]  Vilazodone HCl (VIIBRYD) 10 MG TABS Viibryd 10 mg  tablet  TAKE 1 2 (ONE HALF) TABLET BY MOUTH ONCE DAILY FOR 7 DAYS THEN INCREASE TO 1 WHOLE TABLET ONCE DAILY    [provider]    Family History Family History  Problem Relation Age of Onset  . Diabetes Father   . Hyperlipidemia Sister   . Heart disease Sister   . Stroke Sister   . Diabetes Brother   . Hyperlipidemia Sister   . Heart disease Sister   . Arthritis Mother   . Heart disease Mother   . Uterine cancer Mother   . Diabetes Brother   . Heart Problems Brother     Social History Social History   Tobacco Use  . Smoking status: Former Smoker    Packs/day: 0.10    Years: 14.00    Pack years: 1.40    Types: Cigarettes  . Smokeless tobacco: Never Used  . Tobacco comment: 03/31/2018 "quit ~ 1980; someday smoker when I did smoke; never addicted"  Substance Use Topics  . Alcohol use: Never  . Drug use: Yes    Types: Oxycodone    Comment: 03/31/2018 "for chronic neck and back pain"     Allergies   Buprenorphine, Contrast media [iodinated diagnostic agents], Cymbalta [duloxetine hcl], Erythromycin, Hydrocodone, Hydromorphone, Iodine, Latex, Levofloxacin, Metrizamide, Nsaids, Nucynta  [tapentadol hcl], Other, Oxycodone, Pentazocine, Septra [bactrim], Sulfa antibiotics, Sulfasalazine, Adhesive [tape], and Morphine   Review of Systems Review of Systems  Constitutional: Positive for fatigue.  Genitourinary: Positive for dysuria.  All other systems reviewed and are negative.    Physical Exam Triage Vital Signs ED Triage Vitals  Enc Vitals Group     BP 08/31/19 1910 132/78     Pulse Rate 08/31/19 1910 88     Resp 08/31/19 1910 18       Temp 08/31/19 1910 98.7 F (37.1 C)     Temp Source 08/31/19 1910 Oral     SpO2 08/31/19 1910 97 %     Weight 08/31/19 1912 123 lb 14.4 oz (56.2 kg)     Height 08/31/19 1912 5\' 6"  (1.676 m)     Head Circumference --      Peak Flow --      Pain Score 08/31/19 1911 3     Pain Loc --      Pain Edu? --      Excl. in [place]? --    No data found.  Updated Vital Signs BP 132/78 (BP Location: Right Arm)   Pulse 88   Temp 98.7 F (37.1 C) (Oral)   Resp 18   Ht 5\' 6"  (1.676 m)   Wt 56.2 kg   SpO2 97%   BMI 20.00 kg/m   Visual Acuity Right Eye Distance:   Left Eye Distance:   Bilateral Distance:    Right Eye Near:   Left Eye Near:    Bilateral Near:     Physical Exam Vitals and nursing note reviewed.  Constitutional:      Appearance: Normal appearance.  Cardiovascular:     Rate and Rhythm: Normal rate and regular rhythm.     Comments: History of atrial fib Abdominal:     Comments: No CVA tenderness  Neurological:     Mental Status: She is alert.      UC Treatments / Results  Labs (all labs ordered are listed, but only abnormal results are displayed) Labs Reviewed  POCT URINALYSIS DIP (MANUAL ENTRY) - Abnormal; Notable for the following components:      Result Value  Blood, UA trace-lysed (*)    Leukocytes, UA Small (1+) (*)    All other components within normal limits  URINE CULTURE   Urine does show small amount of leukocytes as well as red blood cells EKG   Radiology No results found.  Procedures Procedures (including critical care time)  Medications Ordered in UC Medications - No data to display  Initial Impression / Assessment and Plan / UC Course  I have reviewed the triage vital signs and the nursing notes.  Pertinent labs & imaging results that were available during my care of the patient were reviewed by me and considered in my medical decision making (see chart for details).     UTI, mild.  Will reculture but treat with Cipro 250 twice  daily for 5 days Final Clinical Impressions(s) / UC Diagnoses   Final diagnoses:  Dysuria  Lower urinary tract infection     Discharge Instructions     Be sure to drink plenty of water. Take Cipro with food   ED Prescriptions    Medication Sig Dispense Auth. Provider   ciprofloxacin (CIPRO) 250 MG tablet Take 1 tablet (250 mg total) by mouth every 12 (twelve) hours. 10 tablet Wardell Honour, MD     PDMP not reviewed this encounter.   Wardell Honour, MD 08/31/19 1946

## 2019-08-31 NOTE — Discharge Instructions (Signed)
Be sure to drink plenty of water. Take Cipro with food

## 2019-09-02 LAB — URINE CULTURE
MICRO NUMBER:: 10184251
SPECIMEN QUALITY:: ADEQUATE

## 2019-09-05 ENCOUNTER — Encounter (HOSPITAL_COMMUNITY): Payer: Self-pay

## 2019-09-05 ENCOUNTER — Telehealth (HOSPITAL_COMMUNITY): Payer: Self-pay | Admitting: Emergency Medicine

## 2019-09-05 NOTE — Telephone Encounter (Signed)
Per Dr. Mannie Stabile, if no improvement, send keflex BID x5 days. Attempted to reach patient. No answer at this time. Voicemail left.

## 2019-09-06 DIAGNOSIS — F322 Major depressive disorder, single episode, severe without psychotic features: Secondary | ICD-10-CM | POA: Diagnosis not present

## 2019-09-07 ENCOUNTER — Telehealth: Payer: Self-pay

## 2019-09-07 NOTE — Telephone Encounter (Signed)
Patient called in stating she was seen at [facility] at [facility] in [place] for an unresolved UTI. States that she was prescribed a 5 day dose of CIPRO. She is extremely concerned that this medication has had a permanent effect on her body in regards to her being on TIKOSYN.  She has called both the pharmacy and the [place] clinic, both stating that she should be fine and that since she has finished the prescription already, there really isn't anything that can be done.  Patient states that the provider at [facility] stated that PCP had previously prescribed CIPRO to patient prior to writing prescription. Patient states that she also received her second COVID vaccine on 09/01/19, second dose of CIPRO on the same day.  Patient is wanting to know what PCP recommends

## 2019-09-07 NOTE — Telephone Encounter (Signed)
Patient notified of PCP recommendations and is agreement and expresses an understanding.  

## 2019-09-07 NOTE — Telephone Encounter (Signed)
There is nothing to be done and no cause for concern.  Cipro at that dose is appropriate and given her many allergies, they may have felt their hands were tied as to what she could take.  I would not worry about it at this point

## 2019-09-08 ENCOUNTER — Other Ambulatory Visit: Payer: Self-pay

## 2019-09-08 ENCOUNTER — Emergency Department (INDEPENDENT_AMBULATORY_CARE_PROVIDER_SITE_OTHER)
Admission: EM | Admit: 2019-09-08 | Discharge: 2019-09-08 | Disposition: A | Discharge status: Home / Self Care | Payer: Medicare Other | Source: Home / Self Care | Attending: Family Medicine | Admitting: Family Medicine

## 2019-09-08 DIAGNOSIS — M4693 Unspecified inflammatory spondylopathy, cervicothoracic region: Secondary | ICD-10-CM | POA: Diagnosis not present

## 2019-09-08 DIAGNOSIS — F419 Anxiety disorder, unspecified: Secondary | ICD-10-CM

## 2019-09-08 DIAGNOSIS — G894 Chronic pain syndrome: Secondary | ICD-10-CM | POA: Diagnosis not present

## 2019-09-08 DIAGNOSIS — Z79891 Long term (current) use of opiate analgesic: Secondary | ICD-10-CM | POA: Diagnosis not present

## 2019-09-08 DIAGNOSIS — R5383 Other fatigue: Secondary | ICD-10-CM

## 2019-09-08 DIAGNOSIS — M5412 Radiculopathy, cervical region: Secondary | ICD-10-CM | POA: Diagnosis not present

## 2019-09-08 DIAGNOSIS — M961 Postlaminectomy syndrome, not elsewhere classified: Secondary | ICD-10-CM | POA: Diagnosis not present

## 2019-09-08 DIAGNOSIS — G500 Trigeminal neuralgia: Secondary | ICD-10-CM | POA: Diagnosis not present

## 2019-09-08 LAB — POCT CBC W AUTO DIFF (K'VILLE URGENT CARE)

## 2019-09-08 NOTE — ED Triage Notes (Signed)
Patient presents to Urgent Care with complaints of generally feeling unwell since leaving the pain clinic earlier today. Patient reports she was on Cipro for a UTI recently, has weakened legs but she has neuropathy baseline. Patient has also had an intermittent headache but thinks that was from the cipro. Patient is on Tikosyn and was told by her cardiologist that she should not have been put on cipro due to possible medication reaction and now she is worried there could be something wrong with her heart and she would like her vitals checked. Patient concerned whether or not she is "just having anxiety or a stroke".

## 2019-09-09 LAB — COMPLETE METABOLIC PANEL WITH GFR
AG Ratio: 1.7 (calc) (ref 1.0–2.5)
ALT: 16 U/L (ref 6–29)
AST: 25 U/L (ref 10–35)
Albumin: 4.2 g/dL (ref 3.6–5.1)
Alkaline phosphatase (APISO): 65 U/L (ref 37–153)
BUN: 16 mg/dL (ref 7–25)
CO2: 26 mmol/L (ref 20–32)
Calcium: 9.1 mg/dL (ref 8.6–10.4)
Chloride: 103 mmol/L (ref 98–110)
Creat: 0.6 mg/dL (ref 0.60–0.93)
GFR, Est African American: 103 mL/min/{1.73_m2} (ref >=60)
GFR, Est Non African American: 89 mL/min/{1.73_m2} (ref >=60)
Globulin: 2.5 g/dL (calc) (ref 1.9–3.7)
Glucose, Bld: 90 mg/dL (ref 65–99)
Potassium: 4.2 mmol/L (ref 3.5–5.3)
Sodium: 139 mmol/L (ref 135–146)
Total Bilirubin: 0.5 mg/dL (ref 0.2–1.2)
Total Protein: 6.7 g/dL (ref 6.1–8.1)

## 2019-09-09 LAB — TSH: TSH: 2.61 mIU/L (ref 0.40–4.50)

## 2019-09-12 NOTE — ED Provider Notes (Signed)
[name]    CSN: [id number] Arrival date & time: 09/08/19  1425      History   Chief Complaint Chief Complaint  Patient presents with  . Anxiety    HPI [name] is a 74 y.o. female.   Patient has a history of chronic pain.  She states that she has not felt well since leaving pain clinic earlier today.  Patient was treated for a UTI about one week ago with Cipro, and also received a COVID19 vaccination on the same day.  She finished Cipro 5 days ago with resolution of UTI symptoms.  She complains of persistent fatigue, and occasional mild dizziness.  She has anxiety and worries about a possible stroke.  The history is provided by the patient.    Past Medical History:  Diagnosis Date  . Anxiety   . Arthritis    "maybe in my back" (03/31/2018)  . Benign paroxysmal positional vertigo 06/08/2013  . Fracture of multiple ribs 2015   "don't know from what; dx'd when I in hospital for 1st back OR" (03/31/2018)  . GERD (gastroesophageal reflux disease)   . Hair loss 04/12/2012  . Herpes   . History of blood transfusion    "twice; related to back OR" (03/31/2018)  . History of kidney stones   . Interstitial cystitis 11/06/2011  . 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 Osteopenia 02/18/2012  . Osteoporosis   . PAF (paroxysmal atrial fibrillation) ([place]) 2012  . Peripheral neuropathy 11/06/2011  . PMDD (premenstrual dysphoric disorder)   . Seasonal allergies   . Vaginal delivery    ONE NSVD  . 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 Secondary hypercoagulable state ([place]) 08/19/2019  . Left inguinal hernia 03/11/2019  . Visit for monitoring Tikosyn therapy 03/29/2018  . Osteoporosis 09/23/2017  . Fatigue 02/18/2017  . Physical exam 04/21/2016  . Anxiety and depression 02/13/2016  . Chronic pain 02/13/2016  . Vertigo 11/16/2015  . B12 deficiency 10/01/2015  . Hearing loss due to cerumen impaction 08/23/2015  . Foot  pain, right 05/23/2015  . Hyperlipidemia 04/19/2015  . Protein-calorie malnutrition ([place]) 10/19/2014  . Fracture of multiple ribs 08/07/2014  . Paroxysmal atrial fibrillation ([facility])   . Tachycardia   . Anemia 08/06/2014  . Constipation 07/14/2014  . Lumbar back pain 05/19/2012  . Seasonal allergic rhinitis 11/06/2011  . Neuropathy 11/06/2011  . S/P cervical spinal fusion 11/06/2011  . GERD (gastroesophageal reflux disease) 11/06/2011    Past Surgical History:  Procedure Laterality Date  . ANTERIOR CERVICAL DECOMP/DISCECTOMY FUSION  ~ 2003  . BACK SURGERY    . BREAST SURGERY     BREAST BIOPSY--RIGHT BENIGN  . BUNIONECTOMY Bilateral   . COSMETIC SURGERY  2016   "back of my neck; related to earlier fusion"  . CYSTOSCOPY W/ STONE MANIPULATION  "several times"  . DILATION AND CURETTAGE OF UTERUS    . FOREHEAD RECONSTRUCTION Right    "removed bone protruding out of my forehead"  . HARDWARE REMOVAL  2016   "related to neck OR"  . INCONTINENCE SURGERY    . POSTERIOR CERVICAL FUSION/FORAMINOTOMY  ~ 2008; 2015  . SHOULDER ARTHROSCOPY W/ ROTATOR CUFF REPAIR Right 2012  . SPINAL FUSION  06/2014 - 2018 X ?7   "scoliosis; my entire back"  . TUBAL LIGATION    . VAGINAL HYSTERECTOMY     TVH    OB History  Gravida  1   Para  1   Term  1   Preterm      AB      Living  1     SAB      TAB      Ectopic      Multiple      Live Births  1            Home Medications    Prior to Admission medications   Medication Sig Start Date End Date Taking? Authorizing Provider  diltiazem (CARDIZEM CD) 240 MG 24 hr capsule Take 1 capsule (240 mg total) by mouth daily. 08/04/19  Yes Sherran Needs, NP  dofetilide (TIKOSYN) 250 MCG capsule Take 1 capsule (250 mcg total) by mouth 2 (two) times daily. 08/01/19  Yes Sherran Needs, NP  metoprolol succinate (TOPROL XL) 25 MG 24 hr tablet Take 0.5 tablets (12.5 mg total) by mouth 2 (two) times daily. 08/17/19  Yes Sherran Needs,  NP  acetaminophen (TYLENOL) 500 MG tablet Take 1,000 mg by mouth every 6 (six) hours as needed for mild pain.     [provider]  Calcium Carbonate Antacid (TUMS CHEWY BITES PO) Take 1 tablet by mouth daily as needed (reflux).     [provider]  Calcium Citrate-Vitamin D (CALCIUM + D PO) Take 1 tablet by mouth daily.    [provider]  clonazePAM (KLONOPIN) 0.5 MG tablet Take 1 tablet (0.5 mg total) by mouth 2 (two) times daily as needed for anxiety. 08/02/19   [name], MD  cyclobenzaprine (FLEXERIL) 10 MG tablet Take 10 mg by mouth 3 (three) times daily as needed for muscle spasms.  12/10/17   [provider]  famotidine (PEPCID) 20 MG tablet Take 1 tablet (20 mg total) by mouth daily. 08/08/19   [name], MD  furosemide (LASIX) 20 MG tablet Take 1 tablet (20 mg total) by mouth daily as needed for fluid or edema. 08/01/19   Camnitz, Ocie Doyne, MD  gabapentin (NEURONTIN) 100 MG capsule Take 300 mg by mouth at bedtime.     [provider]  ketoconazole (NIZORAL) 2 % cream ketoconazole 2 % topical cream  APP EXT AA BID    [provider]  lactulose (CONSTULOSE) 10 GM/15ML solution Constulose 10 gram/15 mL oral solution  TAKE 15 TO 30 ML BY MOUTH ONCE DAILY    [provider]  Magnesium 200 MG TABS Take 1 tablet (200 mg total) by mouth daily. 07/26/18   Sherran Needs, NP  meclizine (ANTIVERT) 25 MG tablet Take 1 tablet (25 mg total) by mouth 3 (three) times daily as needed for dizziness. 07/09/17   [name], MD  Nystatin POWD Apply small amount to affected area. 12/08/17   Princess Bruins, MD  oxyCODONE (ROXICODONE) 5 MG/5ML solution Take 6.5 mg by mouth every 4 (four) hours as needed for moderate pain.  10/31/15   [provider]  polyethylene glycol (MIRALAX / GLYCOLAX) packet Take 17 g by mouth daily as needed for mild constipation.    [provider]  potassium chloride (K-DUR) 10 MEQ  tablet Take 1 tablet (10 mEq total) by mouth daily. 11/23/18   Evans Lance, MD  valACYclovir (VALTREX) 1000 MG tablet TK 2 TS PO TONIGHT AND 2 TS TOMORROW MORNING UTD 06/09/18   [provider]  Vilazodone HCl (VIIBRYD) 10 MG TABS Viibryd 10 mg tablet  TAKE 1 2 (ONE HALF)  TABLET BY MOUTH ONCE DAILY FOR 7 DAYS THEN INCREASE TO 1 WHOLE TABLET ONCE DAILY    [provider]  XARELTO 20 MG TABS tablet TAKE 1 TABLET BY MOUTH ONCE DAILY WITH SUPPER 08/29/19   Sherran Needs, NP    Family History Family History  Problem Relation Age of Onset  . Diabetes Father   . Hyperlipidemia Sister   . Heart disease Sister   . Stroke Sister   . Diabetes Brother   . Hyperlipidemia Sister   . Heart disease Sister   . Arthritis Mother   . Heart disease Mother   . Uterine cancer Mother   . Diabetes Brother   . Heart Problems Brother     Social History Social History   Tobacco Use  . Smoking status: Former Smoker    Packs/day: 0.10    Years: 14.00    Pack years: 1.40    Types: Cigarettes  . Smokeless tobacco: Never Used  . Tobacco comment: 03/31/2018 "quit ~ 1980; someday smoker when I did smoke; never addicted"  Substance Use Topics  . Alcohol use: Never  . Drug use: Yes    Types: Oxycodone    Comment: 03/31/2018 "for chronic neck and back pain"     Allergies   Buprenorphine, Contrast media [iodinated diagnostic agents], Cymbalta [duloxetine hcl], Erythromycin, Hydrocodone, Hydromorphone, Iodine, Latex, Levofloxacin, Metrizamide, Nsaids, Nucynta  [tapentadol hcl], Other, Oxycodone, Pentazocine, Septra [bactrim], Sulfa antibiotics, Sulfasalazine, Adhesive [tape], and Morphine   Review of Systems Review of Systems No sore throat No cough No pleuritic pain No wheezing No nasal congestion No post-nasal drainage No sinus pain/pressure No itchy/red eyes No earache No hemoptysis No SOB No fever/chills No nausea No vomiting No abdominal pain No diarrhea No urinary  symptoms No skin rash + fatigue No myalgias No headache    Physical Exam Triage Vital Signs ED Triage Vitals  Enc Vitals Group     BP 09/08/19 1441 (!) 145/77     Pulse Rate 09/08/19 1441 82     Resp 09/08/19 1441 20     Temp 09/08/19 1441 98.9 F (37.2 C)     Temp Source 09/08/19 1441 Oral     SpO2 09/08/19 1441 98 %     Weight 09/08/19 1438 123 lb 14.4 oz (56.2 kg)     Height 09/08/19 1438 5\' 6"  (1.676 m)     Head Circumference --      Peak Flow --      Pain Score 09/08/19 1437 6     Pain Loc --      Pain Edu? --      Excl. in [place]? --    No data found.  Updated Vital Signs BP (!) 145/77 (BP Location: Right Arm)   Pulse 82   Temp 98.9 F (37.2 C) (Oral)   Resp 20   Ht 5\' 6"  (1.676 m)   Wt 56.2 kg   SpO2 98%   BMI 20.00 kg/m   Visual Acuity Right Eye Distance:   Left Eye Distance:   Bilateral Distance:    Right Eye Near:   Left Eye Near:    Bilateral Near:     Physical Exam Nursing notes and Vital Signs reviewed. Appearance:  Patient appears stated age, and in no acute distress.   Psychiatric:  Patient is alert and oriented with good eye contact.  Thoughts are organized.  No psychomotor retardation.  Memory intact.  Not suicidal  Eyes:  Pupils are equal, round, and reactive  to light and accomodation.  Extraocular movement is intact.  Conjunctivae are not inflamed   Pharynx:  Normal; moist mucous membranes  Neck:  Supple.  No adenopathy or thyromegaly. Lungs:  Clear to auscultation.  Breath sounds are equal.  Moving air well. Heart:  Regular rate and rhythm without murmurs, rubs, or gallops.  Abdomen:  Nontender without masses or hepatosplenomegaly.  Bowel sounds are present.  No CVA or flank tenderness.  Extremities:  No edema.  Skin:  No rash present.  Psychiatric:  Patient is alert and oriented with good eye contact.  Thoughts are organized.  No psychomotor retardation.  Memory intact.  Not suicidal    Neurologic:  Cranial nerves 2 through 12 are  normal.  Patellar and achilles reflexes are normal.  Cerebellar function is intact (finger-to-nose and rapid alternating hand movement).  Gait and station are normal.    UC Treatments / Results  Labs (all labs ordered are listed, but only abnormal results are displayed) Labs Reviewed  TSH  COMPLETE METABOLIC PANEL WITH GFR  POCT CBC W AUTO DIFF ([facility]):  WBC 6.4; LY 22.7; MO 7.5; GR 69.8; Hgb 12.8; Platelets 212     EKG   Radiology No results found.  Procedures Procedures (including critical care time)  Medications Ordered in UC Medications - No data to display  Initial Impression / Assessment and Plan / UC Course  I have reviewed the triage vital signs and the nursing notes.  Pertinent labs & imaging results that were available during my care of the patient were reviewed by me and considered in my medical decision making (see chart for details).    Exam benign.  Normal CBC reassuring. Check CMP and TSH Followup with Family Doctor.  Final Clinical Impressions(s) / UC Diagnoses   Final diagnoses:  Fatigue, unspecified type  Anxiety disorder, unspecified type   Discharge Instructions   None    ED Prescriptions    None        Kandra Nicolas, MD 09/12/19 [phone]

## 2019-09-13 ENCOUNTER — Telehealth: Payer: Self-pay | Admitting: Family Medicine

## 2019-09-13 NOTE — Telephone Encounter (Signed)
Patient called in and was requesting to leave a urine sample to make sure her UTI is cleared up. She was treated at [facility] urgent care on 09/08/19 and 08/31/19. She was told that she would need an appointment but she insisted that she would be able to leave a sample. Patient was notified that provider was out of the office today and she verbalized understanding.

## 2019-09-14 ENCOUNTER — Other Ambulatory Visit: Payer: Self-pay | Admitting: Family Medicine

## 2019-09-14 ENCOUNTER — Other Ambulatory Visit: Payer: Self-pay

## 2019-09-14 ENCOUNTER — Ambulatory Visit (INDEPENDENT_AMBULATORY_CARE_PROVIDER_SITE_OTHER): Payer: Medicare Other

## 2019-09-14 DIAGNOSIS — N390 Urinary tract infection, site not specified: Secondary | ICD-10-CM | POA: Diagnosis not present

## 2019-09-14 DIAGNOSIS — R350 Frequency of micturition: Secondary | ICD-10-CM

## 2019-09-14 DIAGNOSIS — R300 Dysuria: Secondary | ICD-10-CM | POA: Diagnosis not present

## 2019-09-14 LAB — POCT URINALYSIS DIPSTICK
Bilirubin, UA: NEGATIVE
Blood, UA: NEGATIVE
Glucose, UA: NEGATIVE
Ketones, UA: NEGATIVE
Nitrite, UA: NEGATIVE
Protein, UA: NEGATIVE
Spec Grav, UA: 1.015 (ref 1.010–1.025)
Urobilinogen, UA: 0.2 E.U./dL
pH, UA: 6.5 (ref 5.0–8.0)

## 2019-09-14 NOTE — Telephone Encounter (Signed)
Can we schedule pt a lab visit and I will place orders

## 2019-09-14 NOTE — Telephone Encounter (Signed)
Pt would need to schedule a lab visit for UA

## 2019-09-14 NOTE — Telephone Encounter (Signed)
Pt has been scheduled.  °

## 2019-09-14 NOTE — Addendum Note (Signed)
Addended by: Fritz Pickerel on: 09/14/2019 03:58 PM   Modules accepted: Orders

## 2019-09-14 NOTE — Telephone Encounter (Signed)
Please advise 

## 2019-09-16 LAB — URINE CULTURE
MICRO NUMBER:: 10236483
SPECIMEN QUALITY:: ADEQUATE

## 2019-09-19 ENCOUNTER — Telehealth: Payer: Self-pay | Admitting: Family Medicine

## 2019-09-19 ENCOUNTER — Other Ambulatory Visit: Payer: Self-pay | Admitting: General Practice

## 2019-09-19 MED ORDER — NITROFURANTOIN MONOHYD MACRO 100 MG PO CAPS: 100.0000 mg | ORAL_CAPSULE | Freq: Two times a day (BID) | ORAL | 0 refills | Status: DC

## 2019-09-19 NOTE — Telephone Encounter (Signed)
Please advise 

## 2019-09-19 NOTE — Telephone Encounter (Signed)
If not changed frequently, yes they can be a problem

## 2019-09-19 NOTE — Telephone Encounter (Signed)
Called and informed pt that the medication was filled to pharmacy. She was just worried that there would be a reaction with medication such as the Cipro was.

## 2019-09-19 NOTE — Telephone Encounter (Signed)
Pt called back wanting to ask Dr. [name] if panty liners can contribute to UTIs. Please advise.

## 2019-09-19 NOTE — Telephone Encounter (Signed)
There was no interaction listed in the database.  She should be fine to take this medication

## 2019-09-19 NOTE — Telephone Encounter (Signed)
Pt called requesting for nurse to call her. Pt is asking about lab results and if medication was sent to pharmacy for UTI. Please advise.

## 2019-09-19 NOTE — Telephone Encounter (Signed)
Pt made aware

## 2019-09-21 DIAGNOSIS — M810 Age-related osteoporosis without current pathological fracture: Secondary | ICD-10-CM | POA: Diagnosis not present

## 2019-09-22 ENCOUNTER — Telehealth: Payer: Self-pay | Admitting: Family Medicine

## 2019-09-22 DIAGNOSIS — N390 Urinary tract infection, site not specified: Secondary | ICD-10-CM

## 2019-09-22 NOTE — Telephone Encounter (Signed)
Please advise 

## 2019-09-22 NOTE — Telephone Encounter (Signed)
[place] for nurse visit for UA and culture

## 2019-09-22 NOTE — Telephone Encounter (Signed)
Pt scheduled and labs ordered 

## 2019-09-22 NOTE — Addendum Note (Signed)
Addended by: Davis Gourd on: 09/22/2019 03:58 PM   Modules accepted: Orders

## 2019-09-22 NOTE — Telephone Encounter (Signed)
Pt called in stating that she will complete the Macrobid on Sunday. She wanted to know if she could come in Monday to have her urine reck. Please advise. Pt can be reached at the home #

## 2019-09-26 ENCOUNTER — Ambulatory Visit (INDEPENDENT_AMBULATORY_CARE_PROVIDER_SITE_OTHER): Payer: Medicare Other

## 2019-09-26 ENCOUNTER — Other Ambulatory Visit: Payer: Self-pay

## 2019-09-26 DIAGNOSIS — N390 Urinary tract infection, site not specified: Secondary | ICD-10-CM | POA: Diagnosis not present

## 2019-09-26 DIAGNOSIS — F322 Major depressive disorder, single episode, severe without psychotic features: Secondary | ICD-10-CM | POA: Diagnosis not present

## 2019-09-26 LAB — POCT URINALYSIS DIPSTICK
Bilirubin, UA: NEGATIVE
Blood, UA: NEGATIVE
Glucose, UA: NEGATIVE
Ketones, UA: NEGATIVE
Leukocytes, UA: NEGATIVE
Nitrite, UA: NEGATIVE
Protein, UA: NEGATIVE
Spec Grav, UA: 1.01 (ref 1.010–1.025)
Urobilinogen, UA: 0.2 E.U./dL
pH, UA: 6 (ref 5.0–8.0)

## 2019-09-27 LAB — URINE CULTURE
MICRO NUMBER:: 10277467
Result:: NO GROWTH
SPECIMEN QUALITY:: ADEQUATE

## 2019-09-28 DIAGNOSIS — H6040 Cholesteatoma of external ear, unspecified ear: Secondary | ICD-10-CM | POA: Diagnosis not present

## 2019-09-28 DIAGNOSIS — H6123 Impacted cerumen, bilateral: Secondary | ICD-10-CM | POA: Diagnosis not present

## 2019-09-28 DIAGNOSIS — J309 Allergic rhinitis, unspecified: Secondary | ICD-10-CM | POA: Diagnosis not present

## 2019-09-29 DIAGNOSIS — R201 Hypoesthesia of skin: Secondary | ICD-10-CM | POA: Diagnosis not present

## 2019-09-29 DIAGNOSIS — M5412 Radiculopathy, cervical region: Secondary | ICD-10-CM | POA: Diagnosis not present

## 2019-09-29 DIAGNOSIS — M542 Cervicalgia: Secondary | ICD-10-CM | POA: Diagnosis not present

## 2019-09-29 DIAGNOSIS — M5481 Occipital neuralgia: Secondary | ICD-10-CM | POA: Diagnosis not present

## 2019-10-03 ENCOUNTER — Telehealth: Payer: Self-pay | Admitting: Cardiology

## 2019-10-03 NOTE — Telephone Encounter (Signed)
Pt reports "swelling on/around ankle bones".  States right greater than left.  Weight fluctuates from 123 - 126 lb. She did not take Lasix recently d/t UTI and 3 different abx.  She has been taking Lasix again for about a week now, but she does not go to the bathroom much". Denies any other symptoms such as CP, fatigue, SOB, dizziness.  States she does had "neuropathy real bad" and wants to know if this swelling is heart or neuropathy related.  Pt aware that per [name] last office note she had some LEE then and was going to discuss her echo findings (mitral regurg) with the reader of there echo. Will forward to him for review of his last office note and advisement. Pt aware I will follow up once reviewed by [name]. Pt agreeable to plan.

## 2019-10-03 NOTE — Telephone Encounter (Signed)
Pt c/o swelling: STAT is pt has developed SOB within 24 hours  1) How much weight have you gained and in what time span? 2 or 3 pounds fluctuating  2) If swelling, where is the swelling located? Ankles, more in right than left  3) Are you currently taking a fluid pill? yes  4) Are you currently SOB? no  5) Do you have a log of your daily weights (if so, list)? States it ranges from 123lbs up to 126lbs  6) Have you gained 3 pounds in a day or 5 pounds in a week? no  7) Have you traveled recently? No   Patient states her ankles have stayed swollen even when taking her fluid pills. She states she also does not go to the bathroom much when she is on the fluid pills. Please advise.

## 2019-10-04 ENCOUNTER — Telehealth (HOSPITAL_COMMUNITY): Payer: Self-pay | Admitting: *Deleted

## 2019-10-04 NOTE — Telephone Encounter (Signed)
Patient has contniued issues with LLE swelling. Been back on her lasix for 1 week but hasn't resolved. Per [name] increase lasix to 30mg  a day for the next 5 days and then return to 20mg  a day. Pt in agreement. Pt knows [name] is discussing LLE swelling with [name] and will still receive a call from their office for next steps.

## 2019-10-05 ENCOUNTER — Ambulatory Visit: Payer: Medicare Other

## 2019-10-05 NOTE — Patient Instructions (Signed)
Visit Information  Goals Addressed   None     Ms. Mcdermitt was given information about Chronic Care Management services today including:  1. CCM service includes personalized support from designated clinical staff supervised by her physician, including individualized plan of care and coordination with other care providers 2. 24/7 contact phone numbers for assistance for urgent and routine care needs. 3. Standard insurance, coinsurance, copays and deductibles apply for chronic care management only during months in which we provide at least 20 minutes of these services. Most insurances cover these services at 100%, however patients may be responsible for any copay, coinsurance and/or deductible if applicable. This service may help you avoid the need for more expensive face-to-face services. 4. Only one practitioner may furnish and bill the service in a calendar month. 5. The patient may stop CCM services at any time (effective at the end of the month) by phone call to the office staff.  Patient agreed to services and verbal consent obtained.   The patient verbalized understanding of instructions provided today and agreed to receive a mailed copy of patient instruction and/or educational materials. Telephone follow up appointment with pharmacy team member scheduled for:  SIGNATURE

## 2019-10-05 NOTE — Telephone Encounter (Signed)
Patient needs TEE to further evaluate mitral valve.

## 2019-10-05 NOTE — Progress Notes (Unsigned)
Chronic Care Management Pharmacy  Name: ALEXANDER CRITCHLEY  MRN: [id number] DOB: 04-Jun-1946  Initial Planning Appointment: Pt canceled <24 hr notice  Chief Complaint/ HPI  [name],  74 y.o. , female presents for their Initial CCM visit with the clinical pharmacist via telephone due to COVID-19 Pandemic.  PCP : [name], MD  Their chronic conditions include: AFIB, HTN  Office Visits: -1/26(PCP): metoprolol stopped due to fatigue and hair loss, diltiazam increased to 300 mg once daily. COVID vaccine counseling  Consult Visit: -1/25 ([name], cardio): described in 1/26 PCP OV; f/u in 6 months need to call 2 months in advance   Medications: Outpatient Encounter Medications as of 10/05/2019  Medication Sig  . acetaminophen (TYLENOL) 500 MG tablet Take 1,000 mg by mouth every 6 (six) hours as needed for mild pain.   . Calcium Carbonate Antacid (TUMS CHEWY BITES PO) Take 1 tablet by mouth daily as needed (reflux).   . Calcium Citrate-Vitamin D (CALCIUM + D PO) Take 1 tablet by mouth daily.  . clonazePAM (KLONOPIN) 0.5 MG tablet Take 1 tablet (0.5 mg total) by mouth 2 (two) times daily as needed for anxiety.  . cyclobenzaprine (FLEXERIL) 10 MG tablet Take 10 mg by mouth 3 (three) times daily as needed for muscle spasms.   [name] diltiazem (CARDIZEM CD) 240 MG 24 hr capsule Take 1 capsule (240 mg total) by mouth daily.  [name] dofetilide (TIKOSYN) 250 MCG capsule Take 1 capsule (250 mcg total) by mouth 2 (two) times daily.  . famotidine (PEPCID) 20 MG tablet Take 1 tablet (20 mg total) by mouth daily.  . furosemide (LASIX) 20 MG tablet Take 1 tablet (20 mg total) by mouth daily as needed for fluid or edema.  . gabapentin (NEURONTIN) 100 MG capsule Take 300 mg by mouth at bedtime.   [name] ketoconazole (NIZORAL) 2 % cream ketoconazole 2 % topical cream  APP EXT AA BID  . lactulose (CONSTULOSE) 10 GM/15ML solution Constulose 10 gram/15 mL oral solution  TAKE 15 TO 30 ML BY MOUTH ONCE DAILY    . Magnesium 200 MG TABS Take 1 tablet (200 mg total) by mouth daily.  . meclizine (ANTIVERT) 25 MG tablet Take 1 tablet (25 mg total) by mouth 3 (three) times daily as needed for dizziness.  . metoprolol succinate (TOPROL XL) 25 MG 24 hr tablet Take 0.5 tablets (12.5 mg total) by mouth 2 (two) times daily.  . nitrofurantoin, macrocrystal-monohydrate, (MACROBID) 100 MG capsule Take 1 capsule (100 mg total) by mouth 2 (two) times daily.  [name] Nystatin POWD Apply small amount to affected area.  [name] oxyCODONE (ROXICODONE) 5 MG/5ML solution Take 6.5 mg by mouth every 4 (four) hours as needed for moderate pain.   . polyethylene glycol (MIRALAX / GLYCOLAX) packet Take 17 g by mouth daily as needed for mild constipation.  . potassium chloride (K-DUR) 10 MEQ tablet Take 1 tablet (10 mEq total) by mouth daily.  . valACYclovir (VALTREX) 1000 MG tablet TK 2 TS PO TONIGHT AND 2 TS TOMORROW MORNING UTD  . Vilazodone HCl (VIIBRYD) 10 MG TABS Viibryd 10 mg tablet  TAKE 1 2 (ONE HALF) TABLET BY MOUTH ONCE DAILY FOR 7 DAYS THEN INCREASE TO 1 WHOLE TABLET ONCE DAILY  . XARELTO 20 MG TABS tablet TAKE 1 TABLET BY MOUTH ONCE DAILY WITH SUPPER   No facility-administered encounter medications on file as of 10/05/2019.     Current Diagnosis/Assessment:  Goals Addressed   None  AFIB   Patient is currently rate controlled. Patient has failed these meds in past:  Patient is currently controlled on the following medications:   We discussed:     Plan  Continue current medications   and  Hypertension   BP today is:  <140/90  Office blood pressures are  BP Readings from Last 3 Encounters:  09/08/19 (!) 145/77  08/31/19 132/78  08/19/19 134/63    Patient has failed these meds in the past:  Patient checks BP at home    Patient home BP readings are ranging:  We discussed    Plan  Continue current medications

## 2019-10-05 NOTE — Telephone Encounter (Signed)
Spoke to pt. Aware to continue w/ directions given to her yesterday by [facility], per [name]. Pt will update me next week on her status. Patient verbalized understanding and agreeable to plan.

## 2019-10-05 NOTE — Telephone Encounter (Signed)
Juluis Mire, RN at 10/04/2019 4:35 PM  Status: Signed    Patient has contniued issues with LLE swelling. Been back on her lasix for 1 week but hasn't resolved. Per [name] increase lasix to 30mg  a day for the next 5 days and then return to 20mg  a day. Pt in agreement. Pt knows [name] is discussing LLE swelling with [name] and will still receive a call from their office for next steps.     Encounter MyChart Messages  No messages in this encounter

## 2019-10-10 NOTE — Telephone Encounter (Signed)
Kanecia is calling back per Sherri Price's request to update her on what has been going on for the past 5 days. Please advise.

## 2019-10-11 NOTE — Telephone Encounter (Signed)
Patient calling back.   °

## 2019-10-12 DIAGNOSIS — F322 Major depressive disorder, single episode, severe without psychotic features: Secondary | ICD-10-CM | POA: Diagnosis not present

## 2019-10-12 NOTE — Telephone Encounter (Signed)
lmtcb

## 2019-10-12 NOTE — Telephone Encounter (Signed)
Spoke to pt, spent 25 min on phone with her. She is very anxious.  She is upset and apprehensive of what to do. She reports that she took the Lasix as prescribed, and also continued taking 1 tablet for the past 3 days because she wasn't sure what she should do. She reports the swelling has improved some, but still present.  Swelling located more on the top of her foot.  Right foot tends to swell more than the left.   She wonders if this could be joint related, not heart related. She wonders if this could be neuropathy related, not heart related.  She has "neuropathy very bad in my legs".  She just wants to make sure it isn't her heart, if not she will move on with neurology to discuss further. She also has a hernia that will need repair to, along w/ daily chronic pain -- so these health issues don't help her anxiety.  Pt would like to know if she should be taking daily Lasix.  Aware I will discuss w/ Camnitz but he may have neurology be the guide if this is related to neuropathy and not heart. Pt aware I will also discuss the degree of mitral valve change since last echo w/ Dr. Wynonia Lawman in 2019.  Aware that will probably arrange TEE for this summer to further evaluate.  She is agreeable if [name] recommends. Aware I will call her tomorrow once I further discuss w/ Camnitz. Aware I will call her tomorrow so that she does not have to wait until next week as I am not in the office Friday. Pt thanks me for speaking with her and listening.

## 2019-10-13 ENCOUNTER — Telehealth: Payer: Self-pay | Admitting: Family Medicine

## 2019-10-13 MED ORDER — FUROSEMIDE 20 MG PO TABS: 30.0000 mg | ORAL_TABLET | Freq: Every day | ORAL | 3 refills | Status: DC

## 2019-10-13 NOTE — Addendum Note (Signed)
Addended by: Stanton Kidney on: 10/13/2019 04:14 PM   Modules accepted: Orders

## 2019-10-13 NOTE — Telephone Encounter (Signed)
Pt is waiting on Cardiology to return a call to her to discuss next steps. Will call back if she still needs an appt. Stated an understanding to symptoms that should warrant being seen this weekend.

## 2019-10-13 NOTE — Telephone Encounter (Signed)
Spoke with patient. She states she was still waiting for Cardiology to call her back. Scheduled patient with PCP on 10/18/19 and gave patient PCP recommendations if her condition should worsen. Patient voiced understanding.

## 2019-10-13 NOTE — Telephone Encounter (Signed)
Pt called in stating that she is having some swelling in her feet and ankles. She states that she doesn't think it is heart related, she has been talking to her cardiologist about this. She wanted Dr. [name] to know what was going on and to see what her thoughts were. I did offer to schedule a Video visit with her but she states that she would like to speak to a nurse or come in the office for an appt.  Please advise if she can come in next week for in office visit or if she needs to be seen sooner for this.

## 2019-10-13 NOTE — Telephone Encounter (Signed)
Please advise 

## 2019-10-13 NOTE — Telephone Encounter (Signed)
Spoke w/ pt for 26 minutes. Pt informed ok to take Lasix 30 mg daily for swelling, per [name]. Rx sent to [facility] per request. Explained that this swelling may be related to the increasing Diltiazem dosing since swelling seems to have started around the time of increase. Aware I will call her in several weeks to arrange a TEE for May. She is agreeable to plan

## 2019-10-13 NOTE — Telephone Encounter (Signed)
She can come in for an appt next week to be seen but I would definitely have her talk to Cardiology given her Afib (which can cause swelling).  She needs to limit her sodium and increase her water.  She needs to elevate her legs when sitting.  If symptoms change or worsen (CP, SOB) she needs to be seen over the weekend

## 2019-10-18 ENCOUNTER — Other Ambulatory Visit: Payer: Self-pay

## 2019-10-18 ENCOUNTER — Ambulatory Visit (INDEPENDENT_AMBULATORY_CARE_PROVIDER_SITE_OTHER): Payer: Medicare Other | Admitting: Family Medicine

## 2019-10-18 ENCOUNTER — Encounter: Payer: Self-pay | Admitting: Family Medicine

## 2019-10-18 VITALS — BP 121/71 | HR 69 | Temp 97.5°F | Resp 16 | Ht 66.0 in | Wt 125.1 lb

## 2019-10-18 DIAGNOSIS — F32A Depression, unspecified: Secondary | ICD-10-CM

## 2019-10-18 DIAGNOSIS — M25471 Effusion, right ankle: Secondary | ICD-10-CM | POA: Diagnosis not present

## 2019-10-18 DIAGNOSIS — G629 Polyneuropathy, unspecified: Secondary | ICD-10-CM

## 2019-10-18 DIAGNOSIS — F329 Major depressive disorder, single episode, unspecified: Secondary | ICD-10-CM | POA: Diagnosis not present

## 2019-10-18 DIAGNOSIS — F419 Anxiety disorder, unspecified: Secondary | ICD-10-CM | POA: Diagnosis not present

## 2019-10-18 DIAGNOSIS — I480 Paroxysmal atrial fibrillation: Secondary | ICD-10-CM

## 2019-10-18 MED ORDER — CLONAZEPAM 0.5 MG PO TABS: 0.5000 mg | ORAL_TABLET | Freq: Two times a day (BID) | ORAL | 1 refills | Status: DC | PRN

## 2019-10-18 NOTE — Patient Instructions (Signed)
Follow up as needed or as scheduled We'll call you with your Orthopedic and Neuro referrals No med changes at this time Call with any questions or concerns Hang in there!!

## 2019-10-18 NOTE — Progress Notes (Signed)
   Subjective:    Patient ID: [name], female    DOB: 04-19-1946, 74 y.o.   MRN: [id number]  HPI Leg swelling- pt reports worsening swelling recently.  R>L.  Known Afib.  Sees cardiology regularly.  Last ECHO showed 'a leaky valve'.  Has TEE upcoming.  Denies CP, SOB.  Given the asymmetry, cards isn't convinced this is all Afib related.  Pt would like additional work up to assess her R ankle.  Neuropathy- following w/ [facility], Dr Trula Ore.  Doesn't feel that he explains things to her and she doesn't get her questions answered.  Currently on Gabapentin.  Already has pain management doctor.  Wants to know if there are additional medications or treatments that would improve her sxs.  Anxiety- pt was unable to tolerate Viibryd.  Remains in counseling.  Wants to make sure she is able to continue her Klonopin.  Reports 'this is the only way I make it through'.   Review of Systems For ROS see HPI   This visit occurred during the SARS-CoV-2 public health emergency.  Safety protocols were in place, including screening questions prior to the visit, additional usage of staff PPE, and extensive cleaning of exam room while observing appropriate contact time as indicated for disinfecting solutions.       Objective:   Physical Exam Vitals reviewed.  Constitutional:      General: She is not in acute distress.    Appearance: She is well-developed.  HENT:     Head: Normocephalic and atraumatic.  Eyes:     Conjunctiva/sclera: Conjunctivae normal.     Pupils: Pupils are equal, round, and reactive to light.  Neck:     Thyroid: No thyromegaly.  Cardiovascular:     Rate and Rhythm: Normal rate and regular rhythm.     Heart sounds: Murmur (II/VI SEM) present.  Pulmonary:     Effort: Pulmonary effort is normal. No respiratory distress.     Breath sounds: Normal breath sounds.  Abdominal:     General: There is no distension.     Palpations: Abdomen is soft.     Tenderness: There is no abdominal  tenderness.  Musculoskeletal:     Cervical back: Normal range of motion and neck supple.     Right lower leg: Edema (1+ pitting edema of R lateral ankle) present.     Left lower leg: Edema present.  Lymphadenopathy:     Cervical: No cervical adenopathy.  Skin:    General: Skin is warm and dry.  Neurological:     Mental Status: She is alert and oriented to person, place, and time.  Psychiatric:        Behavior: Behavior normal.           Assessment & Plan:  R ankle swelling- new.  Localized to lateral foot and ankle.  Denies pain.  No known injury.  Would like referral to ortho for complete evaluation.  Referral placed.

## 2019-10-18 NOTE — Assessment & Plan Note (Signed)
Pt reports worsening intermittent leg swelling.  Has upcoming TEE to better assess valves.  Following w/ Cards regularly.

## 2019-10-18 NOTE — Assessment & Plan Note (Signed)
Ongoing issue.  Pt is in counseling to try and work through some of her anxiety.  She has been intolerant to every medication we tried.  She is again tearful and fearful that we will take away her Klonopin.  Reassured her that this is not the case.  Refill provided.

## 2019-10-18 NOTE — Assessment & Plan Note (Signed)
Ongoing issue for pt.  On gabapentin.  Sees Neuro and Pain Management.  She feels both are difficult to talk to and don't take the time to listen or answer her questions.  She fears that if she tries to talk to them about her neuropathy they will change her entire regimen.  She is looking for a 2nd opinion.  Referral placed.

## 2019-10-19 ENCOUNTER — Telehealth: Payer: Self-pay | Admitting: Cardiology

## 2019-10-19 DIAGNOSIS — F322 Major depressive disorder, single episode, severe without psychotic features: Secondary | ICD-10-CM | POA: Diagnosis not present

## 2019-10-19 NOTE — Telephone Encounter (Signed)
Patient needs to have hernia repair surgery. She has a consultation to discuss the surgery on 04-27.   She would like to have her TEE done before her hernia surgery.   I advised patient that [name] would be in touch shortly to schedule her procedure.  I assured her that we would do our best to have this scheduled prior to her hernia surgery which has not been scheduled yet.

## 2019-10-19 NOTE — Telephone Encounter (Signed)
Patient needs to have Hernia Surgery. She has a consultation to discuss the surgery on 04-27. She would like to have her Heart procedure done before her Hernia surgery. She would like to speak with Sherri about scheduling the heart procedure.

## 2019-10-20 ENCOUNTER — Ambulatory Visit (INDEPENDENT_AMBULATORY_CARE_PROVIDER_SITE_OTHER): Payer: Medicare Other

## 2019-10-20 ENCOUNTER — Ambulatory Visit: Payer: Self-pay

## 2019-10-20 ENCOUNTER — Ambulatory Visit (INDEPENDENT_AMBULATORY_CARE_PROVIDER_SITE_OTHER): Payer: Medicare Other | Admitting: Orthopaedic Surgery

## 2019-10-20 ENCOUNTER — Other Ambulatory Visit: Payer: Self-pay

## 2019-10-20 ENCOUNTER — Encounter: Payer: Self-pay | Admitting: Orthopaedic Surgery

## 2019-10-20 VITALS — Ht 66.0 in | Wt 123.0 lb

## 2019-10-20 DIAGNOSIS — M25571 Pain in right ankle and joints of right foot: Secondary | ICD-10-CM

## 2019-10-20 DIAGNOSIS — M25472 Effusion, left ankle: Secondary | ICD-10-CM | POA: Insufficient documentation

## 2019-10-20 DIAGNOSIS — M25473 Effusion, unspecified ankle: Secondary | ICD-10-CM

## 2019-10-20 DIAGNOSIS — M25471 Effusion, right ankle: Secondary | ICD-10-CM | POA: Diagnosis not present

## 2019-10-20 NOTE — Progress Notes (Signed)
Office Visit Note   Patient: [name]           Date of Birth: [birth year]           MRN: [id number] Visit Date: 10/20/2019              Requested by: [name], MD [address] [place],  [place] [address] PCP: [name], MD   Assessment & Plan: Visit Diagnoses:  1. Swelling of ankle, unspecified laterality   2. Ankle swelling, left   3. Ankle swelling, right     Plan: #1: At this time our plan would be to consider use of TED hose during the day and take them off at night. #2: If desired a consultation with vascular and vein would be indicated for their evaluation of the venous and arterial systems. #3: At this time we feel that her swelling is not orthopedically related.  Follow-Up Instructions: Return in about 3 weeks (around 11/10/2019), or if symptoms worsen or fail to improve.   Orders:  Orders Placed This Encounter  Procedures  . XR Foot 2 Views Right  . XR Foot Complete Left  . XR Ankle Complete Left  . XR Ankle Complete Right   No orders of the defined types were placed in this encounter.     Procedures: No procedures performed   Clinical Data: No additional findings.   Subjective: Chief Complaint  Patient presents with  . Right Ankle - Pain   HPI: Patient presents today for bilateral ankle swelling. She said that the right ankle swells more the the left. She sees a cardiologist A-Fib and is on a fluid pill. She states that they wanted her to be evaluated to make sure it was due to her heart. She has no pain in her ankles but she does have neuropathy. She said that her swelling stays at the level of her ankle and below. It improves with elevation of the ankles but not completely though.   Review of Systems  Constitutional: Negative for fatigue.  HENT: Negative for ear pain.   Eyes: Negative for pain.  Respiratory: Positive for shortness of breath.   Cardiovascular: Negative for leg swelling.  Gastrointestinal: Positive for  constipation. Negative for diarrhea.  Endocrine: Negative for cold intolerance and heat intolerance.  Genitourinary: Negative for difficulty urinating.  Musculoskeletal: Positive for joint swelling.  Skin: Negative for rash.  Allergic/Immunologic: Negative for food allergies.  Neurological: Negative for weakness.  Hematological: Does not bruise/bleed easily.  Psychiatric/Behavioral: Negative for sleep disturbance.     Objective: Vital Signs: Ht '5\' 6"'  (1.676 m)   Wt 123 lb (55.8 kg)   BMI 19.85 kg/m   Physical Exam Constitutional:      Appearance: She is well-developed.  Eyes:     Pupils: Pupils are equal, round, and reactive to light.  Pulmonary:     Effort: Pulmonary effort is normal.  Skin:    General: Skin is warm and dry.  Neurological:     Mental Status: She is alert and oriented to person, place, and time.  Psychiatric:        Behavior: Behavior normal.     Ortho Exam  The right lateral ankle has 1-2 plus pitting edema, left lateral ankle has trace to  1 plus.   She is not painful to palpation or with ankle motion or foot motion.. She lacks eversion of ankle bilateral.  Cavus foot bilaterally    Specialty Comments:  No  specialty comments available.  Imaging: XR Ankle Complete Left  Result Date: 10/20/2019 Three-view x-rays of the left ankle reveals some mild degenerative changes in the tib talar joint.  No obvious fractures or dislocation noted.  XR Foot Complete Left  Result Date: 10/20/2019 X-rays of the left foot reveals hallux valgus deformities noted.  No occult pathology other than some degenerative changes.  These are more diffuse .   XR Ankle Complete Right  Result Date: 10/20/2019 X-ray of the ankle today reveals some mild degenerative changes in the midfoot but nothing pronounced.  No obvious fractures noted.  XR Foot 2 Views Right  Result Date: 10/20/2019 X-rays of the right foot reveals hallux valgus deformities noted.  No occult pathology  other than some degenerative changes.  These are more diffuse and noted.    PMFS History: Current Outpatient Medications  Medication Sig Dispense Refill  . acetaminophen (TYLENOL) 500 MG tablet Take 1,000 mg by mouth every 6 (six) hours as needed for mild pain.     . Calcium Carbonate Antacid (TUMS CHEWY BITES PO) Take 1 tablet by mouth daily as needed (reflux).     . Calcium Citrate-Vitamin D (CALCIUM + D PO) Take 1 tablet by mouth daily.    . clonazePAM (KLONOPIN) 0.5 MG tablet Take 1 tablet (0.5 mg total) by mouth 2 (two) times daily as needed for anxiety. 60 tablet 1  . cyclobenzaprine (FLEXERIL) 10 MG tablet Take 10 mg by mouth 3 (three) times daily as needed for muscle spasms.   5  . diltiazem (CARDIZEM CD) 240 MG 24 hr capsule Take 1 capsule (240 mg total) by mouth daily. 30 capsule 3  . dofetilide (TIKOSYN) 250 MCG capsule Take 1 capsule (250 mcg total) by mouth 2 (two) times daily. 180 capsule 3  . famotidine (PEPCID) 20 MG tablet Take 1 tablet (20 mg total) by mouth daily. 90 tablet 1  . furosemide (LASIX) 20 MG tablet Take 1.5 tablets (30 mg total) by mouth daily. 45 tablet 3  . gabapentin (NEURONTIN) 100 MG capsule Take 300 mg by mouth at bedtime.     [name] ketoconazole (NIZORAL) 2 % cream ketoconazole 2 % topical cream  APP EXT AA BID    . lactulose (CONSTULOSE) 10 GM/15ML solution Constulose 10 gram/15 mL oral solution  TAKE 15 TO 30 ML BY MOUTH ONCE DAILY    . levocetirizine (XYZAL) 5 MG tablet Take 5 mg by mouth daily.    . Magnesium 200 MG TABS Take 1 tablet (200 mg total) by mouth daily. 30 each   . meclizine (ANTIVERT) 25 MG tablet Take 1 tablet (25 mg total) by mouth 3 (three) times daily as needed for dizziness. 45 tablet 0  . metoprolol succinate (TOPROL XL) 25 MG 24 hr tablet Take 0.5 tablets (12.5 mg total) by mouth 2 (two) times daily.    [name] NARCAN 4 MG/0.1ML LIQD nasal spray kit 1 spray once.    . nitrofurantoin, macrocrystal-monohydrate, (MACROBID) 100 MG capsule Take  1 capsule (100 mg total) by mouth 2 (two) times daily. (Patient not taking: Reported on 10/20/2019) 14 capsule 0  . Nystatin POWD Apply small amount to affected area. 1 Bottle 3  . oxyCODONE (ROXICODONE) 5 MG/5ML solution Take 6.5 mg by mouth every 4 (four) hours as needed for moderate pain.     . polyethylene glycol (MIRALAX / GLYCOLAX) packet Take 17 g by mouth daily as needed for mild constipation.    . potassium chloride (K-DUR) 10 MEQ tablet  Take 1 tablet (10 mEq total) by mouth daily. 90 tablet 3  . valACYclovir (VALTREX) 500 MG tablet Take 500 mg by mouth daily.    Alveda Reasons 20 MG TABS tablet TAKE 1 TABLET BY MOUTH ONCE DAILY WITH SUPPER 30 tablet 5   No current facility-administered medications for this visit.     Patient Active Problem List   Diagnosis Date Noted  . Ankle swelling, left 10/20/2019  . Ankle swelling, right 10/20/2019  . Secondary hypercoagulable state ([facility]) 08/19/2019  . Left inguinal hernia 03/11/2019  . Visit for monitoring Tikosyn therapy 03/29/2018  . Osteoporosis 09/23/2017  . Fatigue 02/18/2017  . Physical exam 04/21/2016  . Anxiety and depression 02/13/2016  . Chronic pain 02/13/2016  . Vertigo 11/16/2015  . B12 deficiency 10/01/2015  . Hearing loss due to cerumen impaction 08/23/2015  . Foot pain, right 05/23/2015  . Hyperlipidemia 04/19/2015  . Protein-calorie malnutrition ([facility]) 10/19/2014  . Fracture of multiple ribs 08/07/2014  . Paroxysmal atrial fibrillation ([facility])   . Tachycardia   . Anemia 08/06/2014  . Constipation 07/14/2014  . Lumbar back pain 05/19/2012  . Seasonal allergic rhinitis 11/06/2011  . Neuropathy 11/06/2011  . S/P cervical spinal fusion 11/06/2011  . GERD (gastroesophageal reflux disease) 11/06/2011   Past Medical History:  Diagnosis Date  . Anxiety   . Arthritis    "maybe in my back" (03/31/2018)  . Benign paroxysmal positional vertigo 06/08/2013  . Fracture of multiple ribs 2015   "don't know from what; dx'd when I  in hospital for 1st back OR" (03/31/2018)  . GERD (gastroesophageal reflux disease)   . Hair loss 04/12/2012  . Herpes   . History of blood transfusion    "twice; related to back OR" (03/31/2018)  . History of kidney stones   . Interstitial cystitis 11/06/2011  . 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 Osteopenia 02/18/2012  . Osteoporosis   . PAF (paroxysmal atrial fibrillation) ([facility]) 2012  . Peripheral neuropathy 11/06/2011  . PMDD (premenstrual dysphoric disorder)   . Seasonal allergies   . Vaginal delivery    ONE NSVD  . 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 Diabetes Father   . Hyperlipidemia Sister   . Heart disease Sister   . Stroke Sister   . Diabetes Brother   . Hyperlipidemia Sister   . Heart disease Sister   . Arthritis Mother   . Heart disease Mother   . Uterine cancer Mother   . Diabetes Brother   . Heart Problems Brother     Past Surgical History:  Procedure Laterality Date  . ANTERIOR CERVICAL DECOMP/DISCECTOMY FUSION  ~ 2003  . BACK SURGERY    . BREAST SURGERY     BREAST BIOPSY--RIGHT BENIGN  . BUNIONECTOMY Bilateral   . COSMETIC SURGERY  2016   "back of my neck; related to earlier fusion"  . CYSTOSCOPY W/ STONE MANIPULATION  "several times"  . DILATION AND CURETTAGE OF UTERUS    . FOREHEAD RECONSTRUCTION Right    "removed bone protruding out of my forehead"  . HARDWARE REMOVAL  2016   "related to neck OR"  . INCONTINENCE SURGERY    . POSTERIOR CERVICAL FUSION/FORAMINOTOMY  ~ 2008; 2015  . SHOULDER ARTHROSCOPY W/ ROTATOR CUFF REPAIR Right 2012  . SPINAL FUSION  06/2014 - 2018 X ?7   "scoliosis; my entire back"  . TUBAL LIGATION    . VAGINAL HYSTERECTOMY  [facility]   Social History   Occupational History  . Not on file  Tobacco Use  . Smoking status: Former Smoker    Packs/day: 0.10    Years: 14.00    Pack years: 1.40    Types: Cigarettes  . Smokeless tobacco: Never Used  . Tobacco comment: 03/31/2018 "quit  ~ 1980; someday smoker when I did smoke; never addicted"  Substance and Sexual Activity  . Alcohol use: Never  . Drug use: Yes    Types: Oxycodone    Comment: 03/31/2018 "for chronic neck and back pain"  . Sexual activity: Not Currently

## 2019-10-21 ENCOUNTER — Telehealth: Payer: Self-pay | Admitting: Orthopaedic Surgery

## 2019-10-21 NOTE — Telephone Encounter (Signed)
Patient called advised she picked up the compression stockings yesterday and because of her back she can not bend over to pull them up. Patient said she tried but couldn't do it. Patient said she can't even put socks on. Patient said she has had several surgeries on her neck and back. The number to contact patient is [phone]

## 2019-10-21 NOTE — Telephone Encounter (Signed)
Please see below and advise.

## 2019-10-24 NOTE — Telephone Encounter (Signed)
Called and relayed information to patient.

## 2019-10-24 NOTE — Telephone Encounter (Signed)
Please call [name] and tell her the stockings will obviously not work if she has a difficulty applying them-have her keep checking with her primary care re the fluid meds

## 2019-10-28 NOTE — Telephone Encounter (Signed)
Left detailed message informing pt that I would be in touch to arrange TEE

## 2019-11-01 DIAGNOSIS — K4090 Unilateral inguinal hernia, without obstruction or gangrene, not specified as recurrent: Secondary | ICD-10-CM | POA: Diagnosis not present

## 2019-11-04 ENCOUNTER — Telehealth: Payer: Self-pay | Admitting: *Deleted

## 2019-11-04 NOTE — Telephone Encounter (Signed)
Made pt aware that I would follow up next week to arrange date for TEE week of May 10th/17th.  She reports no scheduling conflicts for that week. She will have someone to stay with her that night of procedure (and to take her/drive her home post procedure). Reports she met w/ hernia surgeon and they have decided to hold off on any surgery at this time. "Feels like ok right now and not needed if infrequent issues. If things get worse or flare up then will readdress". Aware I will call next week.  Agreeable to plan

## 2019-11-07 ENCOUNTER — Other Ambulatory Visit (HOSPITAL_COMMUNITY): Payer: Self-pay | Admitting: *Deleted

## 2019-11-07 MED ORDER — DILTIAZEM HCL ER COATED BEADS 240 MG PO CP24: 240.0000 mg | ORAL_CAPSULE | Freq: Every day | ORAL | 6 refills | Status: DC

## 2019-11-10 ENCOUNTER — Ambulatory Visit: Payer: Medicare Other | Admitting: Orthopaedic Surgery

## 2019-11-11 DIAGNOSIS — B078 Other viral warts: Secondary | ICD-10-CM | POA: Diagnosis not present

## 2019-11-11 DIAGNOSIS — D485 Neoplasm of uncertain behavior of skin: Secondary | ICD-10-CM | POA: Diagnosis not present

## 2019-11-11 DIAGNOSIS — L820 Inflamed seborrheic keratosis: Secondary | ICD-10-CM | POA: Diagnosis not present

## 2019-11-11 DIAGNOSIS — L821 Other seborrheic keratosis: Secondary | ICD-10-CM | POA: Diagnosis not present

## 2019-11-11 DIAGNOSIS — Z85820 Personal history of malignant melanoma of skin: Secondary | ICD-10-CM | POA: Diagnosis not present

## 2019-11-15 DIAGNOSIS — F322 Major depressive disorder, single episode, severe without psychotic features: Secondary | ICD-10-CM | POA: Diagnosis not present

## 2019-11-15 NOTE — Telephone Encounter (Signed)
Reviewed procedure instructions with patient. Covid screening scheduled for Saturday, instructions for screening reviewed w/ pt.  TEE instructions reviewed w/ pt.  Aware to arrive to [facility] at 7:30 am on 5/17. Patient verbalized understanding and agreeable to plan.

## 2019-11-17 ENCOUNTER — Telehealth: Payer: Self-pay | Admitting: Cardiovascular Disease

## 2019-11-17 NOTE — Telephone Encounter (Signed)
New Message  Patient is calling in to get information about her procedure on Monday. Please give patient a call to discuss.

## 2019-11-17 NOTE — Telephone Encounter (Signed)
Returned pt call. She explained that she had not heard from the hospital yet and had some questions. Informed that she may have 1 visitor accompany her to TEE, visitor will have to wait in the waiting area. She appreciates the return call/information.

## 2019-11-18 ENCOUNTER — Other Ambulatory Visit: Payer: Self-pay

## 2019-11-19 ENCOUNTER — Other Ambulatory Visit (HOSPITAL_COMMUNITY)
Admission: RE | Admit: 2019-11-19 | Discharge: 2019-11-19 | Disposition: A | Discharge status: Home / Self Care | Payer: Medicare Other | Source: Ambulatory Visit | Attending: Cardiovascular Disease | Admitting: Cardiovascular Disease

## 2019-11-19 DIAGNOSIS — Z20822 Contact with and (suspected) exposure to covid-19: Secondary | ICD-10-CM | POA: Insufficient documentation

## 2019-11-19 DIAGNOSIS — Z01812 Encounter for preprocedural laboratory examination: Secondary | ICD-10-CM | POA: Diagnosis not present

## 2019-11-19 LAB — SARS CORONAVIRUS 2 (TAT 6-24 HRS): SARS Coronavirus 2: NEGATIVE

## 2019-11-20 NOTE — Anesthesia Preprocedure Evaluation (Addendum)
Anesthesia Evaluation  Patient identified by MRN, date of birth, ID band Patient awake    Reviewed: Allergy & Precautions, H&P , NPO status , Patient's Chart, lab work & pertinent test results  Airway Mallampati: II  TM Distance: >3 FB Neck ROM: Limited    Dental no notable dental hx. (+) Teeth Intact, Dental Advisory Given   Pulmonary neg pulmonary ROS, former smoker,    Pulmonary exam normal breath sounds clear to auscultation       Cardiovascular Exercise Tolerance: Good Pt. on home beta blockers + dysrhythmias Atrial Fibrillation + Valvular Problems/Murmurs MR  Rhythm:Regular Rate:Normal     Neuro/Psych Anxiety Depression negative neurological ROS     GI/Hepatic Neg liver ROS, GERD  Medicated,  Endo/Other  negative endocrine ROS  Renal/GU negative Renal ROS  negative genitourinary   Musculoskeletal  (+) Arthritis , Osteoarthritis,    Abdominal   Peds  Hematology  (+) Blood dyscrasia, anemia ,   Anesthesia Other Findings   Reproductive/Obstetrics negative OB ROS                            Anesthesia Physical Anesthesia Plan  ASA: III  Anesthesia Plan: MAC   Post-op Pain Management:    Induction: Intravenous  PONV Risk Score and Plan: 2 and Propofol infusion and Treatment may vary due to age or medical condition  Airway Management Planned: Nasal Cannula  Additional Equipment:   Intra-op Plan:   Post-operative Plan:   Informed Consent: I have reviewed the patients History and Physical, chart, labs and discussed the procedure including the risks, benefits and alternatives for the proposed anesthesia with the patient or authorized representative who has indicated his/her understanding and acceptance.     Dental advisory given  Plan Discussed with: CRNA  Anesthesia Plan Comments:         Anesthesia Quick Evaluation

## 2019-11-21 ENCOUNTER — Ambulatory Visit (HOSPITAL_COMMUNITY): Payer: Medicare Other | Admitting: Anesthesiology

## 2019-11-21 ENCOUNTER — Other Ambulatory Visit: Payer: Self-pay

## 2019-11-21 ENCOUNTER — Ambulatory Visit (HOSPITAL_COMMUNITY)
Admission: RE | Admit: 2019-11-21 | Discharge: 2019-11-21 | Disposition: A | Discharge status: Home / Self Care | Payer: Medicare Other | Attending: Cardiovascular Disease | Admitting: Cardiovascular Disease

## 2019-11-21 ENCOUNTER — Ambulatory Visit (HOSPITAL_COMMUNITY): Payer: Medicare Other

## 2019-11-21 ENCOUNTER — Encounter (HOSPITAL_COMMUNITY)
Admission: RE | Disposition: A | Discharge status: Home / Self Care | Payer: Self-pay | Source: Home / Self Care | Attending: Cardiovascular Disease

## 2019-11-21 ENCOUNTER — Telehealth: Payer: Self-pay | Admitting: Cardiology

## 2019-11-21 DIAGNOSIS — I341 Nonrheumatic mitral (valve) prolapse: Secondary | ICD-10-CM | POA: Diagnosis not present

## 2019-11-21 DIAGNOSIS — I480 Paroxysmal atrial fibrillation: Secondary | ICD-10-CM | POA: Diagnosis not present

## 2019-11-21 DIAGNOSIS — E785 Hyperlipidemia, unspecified: Secondary | ICD-10-CM | POA: Diagnosis not present

## 2019-11-21 DIAGNOSIS — F418 Other specified anxiety disorders: Secondary | ICD-10-CM | POA: Diagnosis not present

## 2019-11-21 DIAGNOSIS — I340 Nonrheumatic mitral (valve) insufficiency: Secondary | ICD-10-CM | POA: Diagnosis not present

## 2019-11-21 HISTORY — PX: TEE WITHOUT CARDIOVERSION: SHX5443

## 2019-11-21 SURGERY — ECHOCARDIOGRAM, TRANSESOPHAGEAL
Anesthesia: Monitor Anesthesia Care

## 2019-11-21 MED ORDER — PROPOFOL 10 MG/ML IV BOLUS
INTRAVENOUS | Status: DC | PRN
  Administered 2019-11-21: 30 mg via INTRAVENOUS
  Administered 2019-11-21: 20 mg via INTRAVENOUS

## 2019-11-21 MED ORDER — PROPOFOL 500 MG/50ML IV EMUL
INTRAVENOUS | Status: DC | PRN
  Administered 2019-11-21: 100 ug/kg/min via INTRAVENOUS

## 2019-11-21 MED ORDER — SODIUM CHLORIDE 0.9 % IV SOLN: INTRAVENOUS | Status: DC | PRN

## 2019-11-21 MED ORDER — LIDOCAINE 2% (20 MG/ML) 5 ML SYRINGE
INTRAMUSCULAR | Status: DC | PRN
  Administered 2019-11-21: 40 mg via INTRAVENOUS

## 2019-11-21 MED ORDER — SODIUM CHLORIDE 0.9 % IV SOLN: INTRAVENOUS | Status: DC

## 2019-11-21 NOTE — Telephone Encounter (Signed)
Pt had a TEE this morning and called the office in tears she was very overwhelmed about her results and told she will be needing to see a Psychologist, sport and exercise. I helped her to relax and explained her results to her and advised her to try and relax the rest of the day today and to hydrate well until the sedation meds out of her system. I reassured her that Dr. Johnsie Cancel will reach out to Dr. Ricard Dillon and his [facility] will be contacting her with an appt/ plan for what to do next.   By the end of the call the pt sounded much more relaxed and was appreciate of the time I spent with her on the phone. She will call back if she develops any further questions or concerns but for now will rest and eat soft foods/hydrate well as tolerated after having her TEE.

## 2019-11-21 NOTE — Anesthesia Postprocedure Evaluation (Signed)
Anesthesia Post Note  Patient: [name]  Procedure(s) Performed: TRANSESOPHAGEAL ECHOCARDIOGRAM (TEE) (N/A )     Patient location during evaluation: Endoscopy Anesthesia Type: MAC Level of consciousness: awake and alert Pain management: pain level controlled Vital Signs Assessment: post-procedure vital signs reviewed and stable Respiratory status: spontaneous breathing, nonlabored ventilation and respiratory function stable Cardiovascular status: stable and blood pressure returned to baseline Postop Assessment: no apparent nausea or vomiting Anesthetic complications: no    Last Vitals:  Vitals:   11/21/19 0850 11/21/19 0858  BP: (!) 112/43 (!) 130/56  Pulse: 76 73  Resp: (!) 22 16  Temp:    SpO2: 98% 99%    Last Pain:  Vitals:   11/21/19 0840  TempSrc: Oral  PainSc: 0-No pain                 Anye Brose,W. EDMOND

## 2019-11-21 NOTE — Telephone Encounter (Signed)
Patient calling stating she just the left hospital and had a TEE done. She states she needs to speak with a nurse as soon as possible to discuss her questions.

## 2019-11-21 NOTE — H&P (Signed)
Physician History and Physical     Patient ID: [name] MRN: [id number] DOB/AGE: 1945/12/13 74 y.o. Admit date: 11/21/2019  Primary Care Physician: [name], MD Primary Cardiologist: Curt Bears  Active Problems:   * No active hospital problems. *   HPI:  74 y.o. with moderate MR by TTE referred for TEE. She has some difficulty swallowing with no previous abdominal surgery Previous dental proceedures and multiple neck fusion proceedures. Had her take tikosyn this am Risks discussed including esophageal injury aspiration intubation Willing to proceed will do with aneshesia  Review of systems complete and found to be negative unless listed above   Past Medical History:  Diagnosis Date  . Anxiety   . Arthritis    "maybe in my back" (03/31/2018)  . Benign paroxysmal positional vertigo 06/08/2013  . Fracture of multiple ribs 2015   "don't know from what; dx'd when I in hospital for 1st back OR" (03/31/2018)  . GERD (gastroesophageal reflux disease)   . Hair loss 04/12/2012  . Herpes   . History of blood transfusion    "twice; related to back OR" (03/31/2018)  . History of kidney stones   . Interstitial cystitis 11/06/2011  . 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 Osteopenia 02/18/2012  . Osteoporosis   . PAF (paroxysmal atrial fibrillation) ([place]) 2012  . Peripheral neuropathy 11/06/2011  . PMDD (premenstrual dysphoric disorder)   . Seasonal allergies   . Vaginal delivery    ONE NSVD  . 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 Diabetes Father   . Hyperlipidemia Sister   . Heart disease Sister   . Stroke Sister   . Diabetes Brother   . Hyperlipidemia Sister   . Heart disease Sister   . Arthritis Mother   . Heart disease Mother   . Uterine cancer Mother   . Diabetes Brother   . Heart Problems Brother     Social History   Socioeconomic History  . Marital status: Divorced    Spouse name: Not on file  . Number of children: Not  on file  . Years of education: Not on file  . Highest education level: Not on file  Occupational History  . Not on file  Tobacco Use  . Smoking status: Former Smoker    Packs/day: 0.10    Years: 14.00    Pack years: 1.40    Types: Cigarettes  . Smokeless tobacco: Never Used  . Tobacco comment: 03/31/2018 "quit ~ 1980; someday smoker when I did smoke; never addicted"  Substance and Sexual Activity  . Alcohol use: Never  . Drug use: Yes    Types: Oxycodone    Comment: 03/31/2018 "for chronic neck and back pain"  . Sexual activity: Not Currently  Other Topics Concern  . Not on file  Social History Narrative  . Not on file   Social Determinants of Health   Financial Resource Strain:   . Difficulty of Paying Living Expenses:   Food Insecurity:   . Worried About Charity fundraiser in the Last Year:   . Arboriculturist in the Last Year:   Transportation Needs:   . Film/video editor (Medical):   [name] Lack of Transportation (Non-Medical):   Physical Activity:   . Days of Exercise per Week:   . Minutes of Exercise per Session:   Stress:   . Feeling of Stress :   Social Connections:   .  Frequency of Communication with Friends and Family:   . Frequency of Social Gatherings with Friends and Family:   . Attends Religious Services:   . Active Member of Clubs or Organizations:   . Attends Archivist Meetings:   [name] Marital Status:   Intimate Partner Violence:   . Fear of Current or Ex-Partner:   . Emotionally Abused:   [name] Physically Abused:   . Sexually Abused:     Past Surgical History:  Procedure Laterality Date  . ANTERIOR CERVICAL DECOMP/DISCECTOMY FUSION  ~ 2003  . BACK SURGERY    . BREAST SURGERY     BREAST BIOPSY--RIGHT BENIGN  . BUNIONECTOMY Bilateral   . COSMETIC SURGERY  2016   "back of my neck; related to earlier fusion"  . CYSTOSCOPY W/ STONE MANIPULATION  "several times"  . DILATION AND CURETTAGE OF UTERUS    . FOREHEAD RECONSTRUCTION Right     "removed bone protruding out of my forehead"  . HARDWARE REMOVAL  2016   "related to neck OR"  . INCONTINENCE SURGERY    . POSTERIOR CERVICAL FUSION/FORAMINOTOMY  ~ 2008; 2015  . SHOULDER ARTHROSCOPY W/ ROTATOR CUFF REPAIR Right 2012  . SPINAL FUSION  06/2014 - 2018 X ?7   "scoliosis; my entire back"  . TUBAL LIGATION    . VAGINAL HYSTERECTOMY     TV[facility]     Medications Prior to Admission  Medication Sig Dispense Refill Last Dose  . acetaminophen (TYLENOL) 500 MG tablet Take 500 mg by mouth every 6 (six) hours as needed for moderate pain.    11/20/2019 at Unknown time  . b complex vitamins tablet Take 1 tablet by mouth daily.   11/20/2019 at Unknown time  . Biotin 10 MG CAPS Take 10 mg by mouth daily.   11/20/2019 at Unknown time  . Calcium Carbonate Antacid (TUMS CHEWY BITES PO) Take 1 tablet by mouth daily as needed (reflux).    11/20/2019 at Unknown time  . Calcium Citrate-Vitamin D (CALCIUM + D PO) Take 1 tablet by mouth daily.   11/20/2019 at Unknown time  . Carboxymethylcellul-Glycerin (LUBRICATING EYE DROPS OP) Place 1 drop into both eyes daily as needed (dry eyes).   Past Month at Unknown time  . cholecalciferol (VITAMIN D3) 25 MCG (1000 UNIT) tablet Take 1,000 Units by mouth daily.   11/20/2019 at Unknown time  . clonazePAM (KLONOPIN) 0.5 MG tablet Take 1 tablet (0.5 mg total) by mouth 2 (two) times daily as needed for anxiety. (Patient taking differently: Take 0.25 mg by mouth daily as needed for anxiety. ) 60 tablet 1 11/20/2019 at Unknown time  . cyclobenzaprine (FLEXERIL) 10 MG tablet Take 10 mg by mouth at bedtime as needed for muscle spasms.   5 Past Week at Unknown time  . diltiazem (CARDIZEM CD) 240 MG 24 hr capsule Take 1 capsule (240 mg total) by mouth daily. (Patient taking differently: Take 240 mg by mouth at bedtime. ) 30 capsule 6 11/20/2019 at Unknown time  . dofetilide (TIKOSYN) 250 MCG capsule Take 1 capsule (250 mcg total) by mouth 2 (two) times daily. 180 capsule 3 11/20/2019  at Unknown time  . famotidine (PEPCID) 20 MG tablet Take 1 tablet (20 mg total) by mouth daily. (Patient taking differently: Take 20 mg by mouth daily as needed for heartburn. ) 90 tablet 1 Past Week at Unknown time  . fluticasone (FLONASE) 50 MCG/ACT nasal spray Place 1 spray into both nostrils daily as needed for allergies or rhinitis.  Past Week at Unknown time  . furosemide (LASIX) 20 MG tablet Take 1.5 tablets (30 mg total) by mouth daily. 45 tablet 3 11/20/2019 at Unknown time  . gabapentin (NEURONTIN) 100 MG capsule Take 100 mg by mouth 2 (two) times daily.    11/20/2019 at Unknown time  . lactulose (CONSTULOSE) 10 GM/15ML solution Take 20 g by mouth daily as needed for moderate constipation.    Past Month at Unknown time  . levocetirizine (XYZAL) 5 MG tablet Take 5 mg by mouth daily.   11/20/2019 at Unknown time  . Magnesium 250 MG TABS Take 250 mg by mouth daily.   11/20/2019 at Unknown time  . meclizine (ANTIVERT) 25 MG tablet Take 1 tablet (25 mg total) by mouth 3 (three) times daily as needed for dizziness. 45 tablet 0 Past Week at Unknown time  . metoprolol succinate (TOPROL XL) 25 MG 24 hr tablet Take 0.5 tablets (12.5 mg total) by mouth 2 (two) times daily.   11/20/2019 at Unknown time  . Nystatin POWD Apply small amount to affected area. (Patient taking differently: Apply 1 application topically daily as needed (rash). ) 1 Bottle 3 Past Month at Unknown time  . oxyCODONE (ROXICODONE) 5 MG/5ML solution Take 8 mg by mouth every 4 (four) hours as needed for moderate pain.    11/21/2019 at Unknown time  . polyethylene glycol (MIRALAX / GLYCOLAX) packet Take 17 g by mouth daily as needed for mild constipation.   11/21/2019 at Unknown time  . potassium chloride (K-DUR) 10 MEQ tablet Take 1 tablet (10 mEq total) by mouth daily. 90 tablet 3 11/20/2019 at Unknown time  . sodium chloride (OCEAN) 0.65 % SOLN nasal spray Place 1 spray into both nostrils as needed for congestion (nose bleeds).   11/20/2019  at Unknown time  . XARELTO 20 MG TABS tablet TAKE 1 TABLET BY MOUTH ONCE DAILY WITH SUPPER (Patient taking differently: Take 20 mg by mouth daily with supper. ) 30 tablet 5 11/20/2019 at Unknown time  . denosumab (PROLIA) 60 MG/ML SOSY injection Inject 60 mg into the skin every 6 (six) months.   More than a month at Unknown time  . Magnesium 200 MG TABS Take 1 tablet (200 mg total) by mouth daily. (Patient not taking: Reported on 11/14/2019) 30 each  Not Taking at Unknown time  . nitrofurantoin, macrocrystal-monohydrate, (MACROBID) 100 MG capsule Take 1 capsule (100 mg total) by mouth 2 (two) times daily. (Patient not taking: Reported on 10/20/2019) 14 capsule 0 Completed Course at Unknown time  . valACYclovir (VALTREX) 500 MG tablet Take 500 mg by mouth daily as needed (breakouts).    More than a month at Unknown time    Physical Exam: Blood pressure (!) 117/53, pulse 68, temperature 98.7 F (37.1 C), temperature source Oral, resp. rate 16, SpO2 100 %.Affect appropriate Thin nervous female  HEENT: normal Neck fused and stiff  JVP normal no bruits no thyromegaly Lungs clear with no wheezing and good diaphragmatic motion Heart:  S1/S2 MR murmur, no rub, gallop or click PMI normal Abdomen: benighn, BS positve, no tenderness, no AAA no bruit.  No HSM or HJR Distal pulses intact with no bruits No edema Neuro non-focal Skin warm and dry No muscular weakness  No current facility-administered medications on file prior to encounter.   Current Outpatient Medications on File Prior to Encounter  Medication Sig Dispense Refill  . acetaminophen (TYLENOL) 500 MG tablet Take 500 mg by mouth every 6 (six) hours as needed  for moderate pain.     [name] b complex vitamins tablet Take 1 tablet by mouth daily.    . Biotin 10 MG CAPS Take 10 mg by mouth daily.    . Calcium Carbonate Antacid (TUMS CHEWY BITES PO) Take 1 tablet by mouth daily as needed (reflux).     . Calcium Citrate-Vitamin D (CALCIUM + D PO)  Take 1 tablet by mouth daily.    . Carboxymethylcellul-Glycerin (LUBRICATING EYE DROPS OP) Place 1 drop into both eyes daily as needed (dry eyes).    . cholecalciferol (VITAMIN D3) 25 MCG (1000 UNIT) tablet Take 1,000 Units by mouth daily.    . clonazePAM (KLONOPIN) 0.5 MG tablet Take 1 tablet (0.5 mg total) by mouth 2 (two) times daily as needed for anxiety. (Patient taking differently: Take 0.25 mg by mouth daily as needed for anxiety. ) 60 tablet 1  . cyclobenzaprine (FLEXERIL) 10 MG tablet Take 10 mg by mouth at bedtime as needed for muscle spasms.   5  . diltiazem (CARDIZEM CD) 240 MG 24 hr capsule Take 1 capsule (240 mg total) by mouth daily. (Patient taking differently: Take 240 mg by mouth at bedtime. ) 30 capsule 6  . dofetilide (TIKOSYN) 250 MCG capsule Take 1 capsule (250 mcg total) by mouth 2 (two) times daily. 180 capsule 3  . famotidine (PEPCID) 20 MG tablet Take 1 tablet (20 mg total) by mouth daily. (Patient taking differently: Take 20 mg by mouth daily as needed for heartburn. ) 90 tablet 1  . fluticasone (FLONASE) 50 MCG/ACT nasal spray Place 1 spray into both nostrils daily as needed for allergies or rhinitis.    . furosemide (LASIX) 20 MG tablet Take 1.5 tablets (30 mg total) by mouth daily. 45 tablet 3  . gabapentin (NEURONTIN) 100 MG capsule Take 100 mg by mouth 2 (two) times daily.     [name] lactulose (CONSTULOSE) 10 GM/15ML solution Take 20 g by mouth daily as needed for moderate constipation.     [name] levocetirizine (XYZAL) 5 MG tablet Take 5 mg by mouth daily.    . Magnesium 250 MG TABS Take 250 mg by mouth daily.    . meclizine (ANTIVERT) 25 MG tablet Take 1 tablet (25 mg total) by mouth 3 (three) times daily as needed for dizziness. 45 tablet 0  . metoprolol succinate (TOPROL XL) 25 MG 24 hr tablet Take 0.5 tablets (12.5 mg total) by mouth 2 (two) times daily.    [name] Nystatin POWD Apply small amount to affected area. (Patient taking differently: Apply 1 application topically daily  as needed (rash). ) 1 Bottle 3  . oxyCODONE (ROXICODONE) 5 MG/5ML solution Take 8 mg by mouth every 4 (four) hours as needed for moderate pain.     . polyethylene glycol (MIRALAX / GLYCOLAX) packet Take 17 g by mouth daily as needed for mild constipation.    . potassium chloride (K-DUR) 10 MEQ tablet Take 1 tablet (10 mEq total) by mouth daily. 90 tablet 3  . sodium chloride (OCEAN) 0.65 % SOLN nasal spray Place 1 spray into both nostrils as needed for congestion (nose bleeds).    Alveda Reasons 20 MG TABS tablet TAKE 1 TABLET BY MOUTH ONCE DAILY WITH SUPPER (Patient taking differently: Take 20 mg by mouth daily with supper. ) 30 tablet 5  . denosumab (PROLIA) 60 MG/ML SOSY injection Inject 60 mg into the skin every 6 (six) months.    . Magnesium 200 MG TABS Take 1 tablet (200 mg  total) by mouth daily. (Patient not taking: Reported on 11/14/2019) 30 each   . nitrofurantoin, macrocrystal-monohydrate, (MACROBID) 100 MG capsule Take 1 capsule (100 mg total) by mouth 2 (two) times daily. (Patient not taking: Reported on 10/20/2019) 14 capsule 0  . valACYclovir (VALTREX) 500 MG tablet Take 500 mg by mouth daily as needed (breakouts).       Labs:   Lab Results  Component Value Date   WBC 6.9 08/25/2018   HGB 13.2 08/25/2018   HCT 39.1 08/25/2018   MCV 92.5 08/25/2018   PLT 207.0 08/25/2018   No results for input(s): NA, K, CL, CO2, BUN, CREATININE, CALCIUM, PROT, BILITOT, ALKPHOS, ALT, AST, GLUCOSE in the last 168 hours.  Invalid input(s): LABALBU Lab Results  Component Value Date   CKTOTAL 75 01/27/2007   TROPONINI <0.03 08/09/2014   TROPONINI 0.03 08/07/2014   TROPONINI <0.03 08/07/2014     Lab Results  Component Value Date   CHOL 237 (H) 09/23/2017   CHOL 214 (H) 04/21/2016   CHOL 211 (H) 10/01/2015   Lab Results  Component Value Date   HDL 100.90 09/23/2017   HDL 86.60 04/21/2016   HDL 95.10 10/01/2015   Lab Results  Component Value Date   LDLCALC 116 (H) 09/23/2017   LDLCALC  111 (H) 04/21/2016   LDLCALC 102 (H) 10/01/2015   Lab Results  Component Value Date   TRIG 101.0 09/23/2017   TRIG 81.0 04/21/2016   TRIG 67.0 10/01/2015   Lab Results  Component Value Date   CHOLHDL 2 09/23/2017   CHOLHDL 2 04/21/2016   CHOLHDL 2 10/01/2015   Lab Results  Component Value Date   LDLDIRECT 136.5 06/08/2013   LDLDIRECT 123.2 11/06/2011       Radiology: No results found.    ASSESSMENT AND PLAN:   1. MR:  Moderate by TTE . TEE today to further assess   Signed: Collier Salina Nishan5/17/2021, 7:57 AM

## 2019-11-21 NOTE — Telephone Encounter (Signed)
Returned call to patient and explained that [name] is off this afternoon and offered to help her. She states she forgot to ask [name] if she could take furosemide today since she did not take it this morning. I advised her that she may resume the furosemide tomorrow since it is so late in the day and she takes furosemide for ankle swelling. She says she has swelling in her upper lip and asks if this is coming from the mouth guard that was placed prior to her TEE which I confirmed. I advised that the swelling should subside over the next few days. She asks if she can take 1/2 of a clonazepam and I advised that she may want to try to go to sleep without taking it since she had sedation earlier today and admits she did not get much sleep last night. She agrees she will only take clonazepam if she is unable to sleep tonight. She thanked me for answering her questions.

## 2019-11-21 NOTE — CV Procedure (Signed)
TEE: Sedation Propofol  Very severe MR posteriorly directed. Flail A3 segment of the anterior leaflet PISA Radius 1.4 and diastolic predominance in PV flow  EF 60% Mild TR Normal AV Normal RV No effusion  No ASD/PFO  Patient will be referred for CVTS consult with Dr Noralee Stain MD [facility]

## 2019-11-21 NOTE — Discharge Instructions (Signed)

## 2019-11-21 NOTE — Anesthesia Procedure Notes (Signed)
Procedure Name: MAC Date/Time: 11/21/2019 8:02 AM Performed by: Leonor Liv, CRNA Pre-anesthesia Checklist: Patient identified, Emergency Drugs available, Suction available, Patient being monitored and Timeout performed Patient Re-evaluated:Patient Re-evaluated prior to induction Oxygen Delivery Method: Nasal cannula Airway Equipment and Method: Bite block Placement Confirmation: positive ETCO2 Dental Injury: Teeth and Oropharynx as per pre-operative assessment

## 2019-11-21 NOTE — Transfer of Care (Signed)
Immediate Anesthesia Transfer of Care Note  Patient: [name]  Procedure(s) Performed: TRANSESOPHAGEAL ECHOCARDIOGRAM (TEE) (N/A )  Patient Location: Endoscopy Unit  Anesthesia Type:MAC  Level of Consciousness: awake, alert  and oriented  Airway & Oxygen Therapy: Patient Spontanous Breathing  Post-op Assessment: Report given to RN, Post -op Vital signs reviewed and stable and Patient moving all extremities X 4  Post vital signs: Reviewed and stable  Last Vitals:  Vitals Value Taken Time  BP 103/36 11/21/19 0838  Temp    Pulse 65 11/21/19 0843  Resp 20 11/21/19 0843  SpO2 97 % 11/21/19 0843  Vitals shown include unvalidated device data.  Last Pain:  Vitals:   11/21/19 0713  TempSrc: Oral  PainSc: 8          Complications: No apparent anesthesia complications

## 2019-11-21 NOTE — Telephone Encounter (Signed)
Follow up  Pt is calling back and would like to speak with Nurse Lelon Frohlich, she said she has some question  Please call

## 2019-11-22 NOTE — Telephone Encounter (Signed)
Returned pt call, spent 28 minutes on phone answering questions. Pt is VERY anxious about news she received yesterday post TEE.   Pt aware that I will reach out to [facility] to ensure referral has been placed and see when appt can be scheduled to discuss options. Pt asks if she should continue her normal daily activities -- pt advised to continue.   Aware I will let her know what [facility] office says. Pt appreciative of plan.

## 2019-11-22 NOTE — Telephone Encounter (Signed)
Follow up     Pt is calling to speak with Lelon Frohlich. She asked if Lelon Frohlich is not available, if [name] can call her.  She is very concerned about her TEE results and is wondering when she will see a Doctor regarding this.  She says she has some unanswered questions    Please call

## 2019-11-23 ENCOUNTER — Encounter: Payer: Self-pay | Admitting: Thoracic Surgery (Cardiothoracic Vascular Surgery)

## 2019-11-23 NOTE — Telephone Encounter (Signed)
Called and asked to speak with [facility]

## 2019-11-24 ENCOUNTER — Telehealth: Payer: Self-pay | Admitting: Cardiology

## 2019-11-24 ENCOUNTER — Ambulatory Visit (INDEPENDENT_AMBULATORY_CARE_PROVIDER_SITE_OTHER): Payer: Medicare Other | Admitting: Student

## 2019-11-24 ENCOUNTER — Telehealth: Payer: Self-pay | Admitting: Student

## 2019-11-24 ENCOUNTER — Encounter: Payer: Self-pay | Admitting: Student

## 2019-11-24 ENCOUNTER — Other Ambulatory Visit: Payer: Self-pay

## 2019-11-24 VITALS — BP 120/64 | HR 73 | Ht 66.0 in | Wt 126.8 lb

## 2019-11-24 DIAGNOSIS — I480 Paroxysmal atrial fibrillation: Secondary | ICD-10-CM | POA: Diagnosis not present

## 2019-11-24 DIAGNOSIS — Z91041 Radiographic dye allergy status: Secondary | ICD-10-CM

## 2019-11-24 DIAGNOSIS — Z01812 Encounter for preprocedural laboratory examination: Secondary | ICD-10-CM | POA: Diagnosis not present

## 2019-11-24 DIAGNOSIS — I340 Nonrheumatic mitral (valve) insufficiency: Secondary | ICD-10-CM

## 2019-11-24 LAB — CBC
Hematocrit: 36.5 % (ref 34.0–46.6)
Hemoglobin: 12.4 g/dL (ref 11.1–15.9)
MCH: 30.5 pg (ref 26.6–33.0)
MCHC: 34 g/dL (ref 31.5–35.7)
MCV: 90 fL (ref 79–97)
Platelets: 175 10*3/uL (ref 150–450)
RBC: 4.07 x10E6/uL (ref 3.77–5.28)
RDW: 12.4 % (ref 11.7–15.4)
WBC: 6.1 10*3/uL (ref 3.4–10.8)

## 2019-11-24 LAB — BASIC METABOLIC PANEL
BUN/Creatinine Ratio: 23 (ref 12–28)
BUN: 17 mg/dL (ref 8–27)
CO2: 28 mmol/L (ref 20–29)
Calcium: 9.2 mg/dL (ref 8.7–10.3)
Chloride: 100 mmol/L (ref 96–106)
Creatinine, Ser: 0.73 mg/dL (ref 0.57–1.00)
GFR calc Af Amer: 93 mL/min/{1.73_m2} (ref >59)
GFR calc non Af Amer: 81 mL/min/{1.73_m2} (ref >59)
Glucose: 93 mg/dL (ref 65–99)
Potassium: 4.3 mmol/L (ref 3.5–5.2)
Sodium: 142 mmol/L (ref 134–144)

## 2019-11-24 MED ORDER — PREDNISONE 50 MG PO TABS
ORAL_TABLET | ORAL | 0 refills | Status: DC
Start: 2019-11-24 — End: 2019-12-19

## 2019-11-24 NOTE — Patient Instructions (Addendum)
Medication Instructions:  none *If you need a refill on your cardiac medications before your next appointment, please call your pharmacy*   Lab Work:  TODAY CBC BMET If you have labs (blood work) drawn today and your tests are completely normal, you will receive your results only by: [name] MyChart Message (if you have MyChart) OR . A paper copy in the mail If you have any lab test that is abnormal or we need to change your treatment, we will call you to review the results.   Testing/Procedures: Your physician has requested that you have a cardiac catheterization. Cardiac catheterization is used to diagnose and/or treat various heart conditions. Doctors may recommend this procedure for a number of different reasons. The most common reason is to evaluate chest pain. Chest pain can be a symptom of coronary artery disease (CAD), and cardiac catheterization can show whether plaque is narrowing or blocking your heart's arteries. This procedure is also used to evaluate the valves, as well as measure the blood flow and oxygen levels in different parts of your heart. For further information please visit [email or web address]. Please follow instruction sheet, as given.     Follow-Up: To Be Determined  At [facility], you and your health needs are our priority.  As part of our continuing mission to provide you with exceptional heart care, we have created designated Provider Care Teams.  These Care Teams include your primary Cardiologist (physician) and Advanced Practice Providers (APPs -  Physician Assistants and Nurse Practitioners) who all work together to provide you with the care you need, when you need it.       Other Instructions    [place] OFFICE [place], [place] [address] Dept: [phone] Loc: [place]  11/24/2019  You are scheduled for a Cardiac Catheterization  on Friday, May 28 with Dr. Lauree Chandler.  1. Please arrive at the [facility] (Main Entrance A) at [facility]: [address] [place], [place] [address] at 7:00 AM (This time is two hours before your procedure to ensure your preparation). Free valet parking service is available.   Special note: Every effort is made to have your procedure done on time. Please understand that emergencies sometimes delay scheduled procedures.  2. Diet: Do not eat solid foods after midnight.    3. Labs: DONE 5/20  4. Medication instructions in preparation for your procedure:     TAKE FINAL DOSE OF XARELTO ON Tuesday;  DO NOT TAKE Wednesday or Thursday       DO NOT TAKE LASIX MORNING OF PROCEDURE (Friday)      TAKE 1 ASPIRIN 81 mg:   Friday morning   Contrast Allergy: Yes, Please take Prednisone 50mg  by mouth at:  Thirteen hours prior to cath 8:00pm on Thursday Seven hours prior to cath 2:00am on Friday   And prior to leaving home Friday morning please take last dose of Prednisone 50mg  and Benadryl 50mg  by mouth.    5. Plan for one night stay--bring personal belongings. 6. Bring a current list of your medications and current insurance cards. 7. You MUST have a responsible person to drive you home. 8. Someone MUST be with you the first 24 hours after you arrive home or your discharge will be delayed. 9. Please wear clothes that are easy to get on and off and wear slip-on shoes.  Thank you for allowing [place] to care for you!   --  [facility]

## 2019-11-24 NOTE — Telephone Encounter (Signed)
I spoke to the patient who would like her sister to accompany her at the visit with [facility] 5/20.  Since we are discussing heart cath, I approved.

## 2019-11-24 NOTE — Telephone Encounter (Signed)
Pt tearful on the phone. Spent 20 minutes speaking with her. She is extremely nervous and worried.  Attempted to alleviate as much concern as I could. Aware that she will need to wait until she sees [facility] to get valve questions answered -- ex: she was asking about % chances of surviving.  Advised her to write down questions, between now and then, that she wants to ask and take them w/ her to consult. Patient verbalized understanding and agreeable to plan.

## 2019-11-24 NOTE — Progress Notes (Signed)
PCP:  [name], MD Primary Cardiologist: No primary care provider on file. Electrophysiologist: Will Meredith Leeds, MD   [name] is a 74 y.o. female seen today for Will Meredith Leeds, MD for routine electrophysiology followup.  Since last being seen in our clinic the patient reports doing about the same. She is very anxious about upcoming procedure(s) and new diagnosis of severe MR. She has intermittent SOB with daily activities. She has upper shoulder pain bilaterally that she suspects is due to numerous neck and back surgeries, but now worries there could be a cardiac component. She has occasional lightheadedness with rapid standing. She denies syncope or near syncope.   Past Medical History:  Diagnosis Date  . Anxiety   . Arthritis    "maybe in my back" (03/31/2018)  . Benign paroxysmal positional vertigo 06/08/2013  . Fracture of multiple ribs 2015   "don't know from what; dx'd when I in hospital for 1st back OR" (03/31/2018)  . GERD (gastroesophageal reflux disease)   . Hair loss 04/12/2012  . Herpes   . History of blood transfusion    "twice; related to back OR" (03/31/2018)  . History of kidney stones   . Interstitial cystitis 11/06/2011  . 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 Osteopenia 02/18/2012  . Osteoporosis   . PAF (paroxysmal atrial fibrillation) ([place]) 2012  . Peripheral neuropathy 11/06/2011  . PMDD (premenstrual dysphoric disorder)   . Seasonal allergies   . Vaginal delivery    ONE NSVD  . 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 ANTERIOR CERVICAL DECOMP/DISCECTOMY FUSION  ~ 2003  . BACK SURGERY    . BREAST SURGERY     BREAST BIOPSY--RIGHT BENIGN  . BUNIONECTOMY Bilateral   . COSMETIC SURGERY  2016   "back of my neck; related to earlier fusion"  . CYSTOSCOPY W/ STONE MANIPULATION  "several times"  . DILATION AND CURETTAGE OF UTERUS    . FOREHEAD RECONSTRUCTION Right    "removed bone protruding out of my  forehead"  . HARDWARE REMOVAL  2016   "related to neck OR"  . INCONTINENCE SURGERY    . POSTERIOR CERVICAL FUSION/FORAMINOTOMY  ~ 2008; 2015  . SHOULDER ARTHROSCOPY W/ ROTATOR CUFF REPAIR Right 2012  . SPINAL FUSION  06/2014 - 2018 X ?7   "scoliosis; my entire back"  . TEE WITHOUT CARDIOVERSION N/A 11/21/2019   Procedure: TRANSESOPHAGEAL ECHOCARDIOGRAM (TEE);  Surgeon: Josue Hector, MD;  [facility] ENDOSCOPY;  Service: Cardiovascular;  Laterality: N/A;  . TUBAL LIGATION    . VAGINAL HYSTERECTOMY     TVH    Current Outpatient Medications  Medication Sig Dispense Refill  . acetaminophen (TYLENOL) 500 MG tablet Take 500 mg by mouth every 6 (six) hours as needed for moderate pain.     [name] b complex vitamins tablet Take 1 tablet by mouth daily.    . Biotin 10 MG CAPS Take 10 mg by mouth daily.    . Calcium Carbonate Antacid (TUMS CHEWY BITES PO) Take 1 tablet by mouth daily as needed (reflux).     . Calcium Citrate-Vitamin D (CALCIUM + D PO) Take 1 tablet by mouth daily.    . Carboxymethylcellul-Glycerin (LUBRICATING EYE DROPS OP) Place 1 drop into both eyes daily as needed (dry eyes).    . cholecalciferol (VITAMIN D3) 25 MCG (1000 UNIT) tablet Take 1,000 Units by mouth daily.    . clonazePAM (KLONOPIN) 0.5 MG  tablet Take 0.5 mg by mouth daily as needed for anxiety.    . cyclobenzaprine (FLEXERIL) 10 MG tablet Take 10 mg by mouth at bedtime as needed for muscle spasms.   5  . denosumab (PROLIA) 60 MG/ML SOSY injection Inject 60 mg into the skin every 6 (six) months.    . diltiazem (CARDIZEM CD) 240 MG 24 hr capsule Take 1 capsule (240 mg total) by mouth daily. 30 capsule 6  . dofetilide (TIKOSYN) 250 MCG capsule Take 1 capsule (250 mcg total) by mouth 2 (two) times daily. 180 capsule 3  . famotidine (PEPCID) 20 MG tablet Take 1 tablet (20 mg total) by mouth daily. 90 tablet 1  . fluticasone (FLONASE) 50 MCG/ACT nasal spray Place 1 spray into both nostrils daily as needed for allergies or  rhinitis.    . furosemide (LASIX) 20 MG tablet Take 1.5 tablets (30 mg total) by mouth daily. 45 tablet 3  . gabapentin (NEURONTIN) 100 MG capsule Take 100 mg by mouth 2 (two) times daily.     [name] lactulose (CONSTULOSE) 10 GM/15ML solution Take 20 g by mouth daily as needed for moderate constipation.     [name] levocetirizine (XYZAL) 5 MG tablet Take 5 mg by mouth daily.    . Magnesium 250 MG TABS Take 250 mg by mouth daily.    . meclizine (ANTIVERT) 25 MG tablet Take 1 tablet (25 mg total) by mouth 3 (three) times daily as needed for dizziness. 45 tablet 0  . metoprolol succinate (TOPROL XL) 25 MG 24 hr tablet Take 0.5 tablets (12.5 mg total) by mouth 2 (two) times daily.    [name] Nystatin POWD Apply small amount to affected area. 1 Bottle 3  . oxyCODONE (ROXICODONE) 5 MG/5ML solution Take 8 mg by mouth every 4 (four) hours as needed for moderate pain.     . polyethylene glycol (MIRALAX / GLYCOLAX) packet Take 17 g by mouth daily as needed for mild constipation.    . potassium chloride (K-DUR) 10 MEQ tablet Take 1 tablet (10 mEq total) by mouth daily. 90 tablet 3  . sodium chloride (OCEAN) 0.65 % SOLN nasal spray Place 1 spray into both nostrils as needed for congestion (nose bleeds).    . valACYclovir (VALTREX) 500 MG tablet Take 500 mg by mouth daily as needed (breakouts).     Alveda Reasons 20 MG TABS tablet TAKE 1 TABLET BY MOUTH ONCE DAILY WITH SUPPER 30 tablet 5   No current facility-administered medications for this visit.    Allergies  Allergen Reactions  . Buprenorphine     nausea, sedation and adhesive reaction  . Contrast Media [Iodinated Diagnostic Agents] Hives  . Cymbalta [Duloxetine Hcl]     Severe diarrhea and upset stomach  . Erythromycin Nausea And Vomiting    Dizziness   . Hydrocodone     Sedation,dizziness, and nausea  . Hydromorphone     Sedation,dizziness, and nausea  . Iodine     unknown  . Latex      hives and sores  . Levofloxacin Nausea And Vomiting  . Metrizamide  Hives  . Nsaids     CANNOT TAKE PER CARDIOLOGIST DUE TO AFIB   . Nucynta [Tapentadol Hcl]     nausea and sedation  . Other     CAN NOT TAKE , aLPRAZOLAM, OR ELAVIL DUE TO AFIB FOR ANXIETY  IV CONTRAST /"DYE".  . Oxycodone     Delusions (intolerance)  PILLS ONLY sedation, dizziness, and nausea  .  Pentazocine     Unknown  . Septra [Bactrim]     Hives   . Sulfa Antibiotics Hives    rash  . Sulfasalazine Hives  . Adhesive [Tape] Rash and Other (See Comments)    Heart monitor stickers must be rotated in order to prevent rash  . Morphine Anxiety    sedation and nausea    Social History   Socioeconomic History  . Marital status: Divorced    Spouse name: Not on file  . Number of children: Not on file  . Years of education: Not on file  . Highest education level: Not on file  Occupational History  . Not on file  Tobacco Use  . Smoking status: Former Smoker    Packs/day: 0.10    Years: 14.00    Pack years: 1.40    Types: Cigarettes  . Smokeless tobacco: Never Used  . Tobacco comment: 03/31/2018 "quit ~ 1980; someday smoker when I did smoke; never addicted"  Substance and Sexual Activity  . Alcohol use: Never  . Drug use: Yes    Types: Oxycodone    Comment: 03/31/2018 "for chronic neck and back pain"  . Sexual activity: Not Currently  Other Topics Concern  . Not on file  Social History Narrative  . Not on file   Social Determinants of Health   Financial Resource Strain:   . Difficulty of Paying Living Expenses:   Food Insecurity:   . Worried About Charity fundraiser in the Last Year:   . Arboriculturist in the Last Year:   Transportation Needs:   . Film/video editor (Medical):   [name] Lack of Transportation (Non-Medical):   Physical Activity:   . Days of Exercise per Week:   . Minutes of Exercise per Session:   Stress:   . Feeling of Stress :   Social Connections:   . Frequency of Communication with Friends and Family:   . Frequency of Social Gatherings with  Friends and Family:   . Attends Religious Services:   . Active Member of Clubs or Organizations:   . Attends Archivist Meetings:   [name] Marital Status:   Intimate Partner Violence:   . Fear of Current or Ex-Partner:   . Emotionally Abused:   [name] Physically Abused:   . Sexually Abused:      Review of Systems: General: No chills, fever, night sweats or weight changes  Cardiovascular:  No chest pain, dyspnea on exertion, edema, orthopnea, palpitations, paroxysmal nocturnal dyspnea Dermatological: No rash, lesions or masses Respiratory: No cough, dyspnea Urologic: No hematuria, dysuria Abdominal: No nausea, vomiting, diarrhea, bright red blood per rectum, melena, or hematemesis Neurologic: No visual changes, weakness, changes in mental status All other systems reviewed and are otherwise negative except as noted above.  Physical Exam: Vitals:   11/24/19 1035  BP: 120/64  Pulse: 73  SpO2: 97%  Weight: 126 lb 12.8 oz (57.5 kg)  Height: 5\' 6"  (1.676 m)    GEN- The patient is well appearing, alert and oriented x 3 today.   HEENT: normocephalic, atraumatic; sclera clear, conjunctiva pink; hearing intact; oropharynx clear; neck supple, no JVP Lymph- no cervical lymphadenopathy Lungs- Clear to ausculation bilaterally, normal work of breathing.  No wheezes, rales, rhonchi Heart- Regular rate and rhythm, no murmurs, rubs or gallops, PMI not laterally displaced GI- soft, non-tender, non-distended, bowel sounds present, no hepatosplenomegaly Extremities- no clubbing, cyanosis, or edema; DP/PT/radial pulses 2+ bilaterally MS- no significant deformity or atrophy  Skin- warm and dry, no rash or lesion Psych- euthymic mood, full affect Neuro- strength and sensation are intact  EKG is ordered. Personal review of EKG from today shows NSR at 73 bpm with normal intervals. Stable QTc on tikosyn.  Additional studies reviewed include: Previous EP office notes, recent labwork, recent TEE.    Assessment and Plan:  1. Severe Mitral Regurgitation By TEE 11/21/2019 To see Dr. Roxy Manns next week For [facility] with Dr. Julianne Handler next week. We discussed the risks and benefits at length today. The patient understands that risks included but are not limited to stroke (1 in 1000), death (1 in 63), kidney failure [usually temporary] (1 in 500), bleeding (1 in 200), allergic reaction [possibly serious] (1 in 200).  The patient understands and agrees to proceed.   2. Paroxysmal AF Remains in NSR on Tikosyn 250 mcg BID Continue Xarelto for CHA2DS2VASC of 3  Will hold Xarelto 48 hours prior to cath.   3. HTN Stable on current regimen.    4. Contrast Dye allergy Pt given instructions for prednisone/benadryl regimen leading up to procedure.   EP follow up 6 months if no issues. We will be available as needed during her surgical process. Further follow up pending cath and TCTS consultation.   Shirley Friar, PA-C  11/24/19 10:48 AM

## 2019-11-24 NOTE — Telephone Encounter (Signed)
   Pt called, she she wanted to speak with [name] nurse she said she's upset about surgery and have a quick question.

## 2019-11-24 NOTE — Telephone Encounter (Signed)
Jakaya is calling requesting her sister attend her appointment today at 10:55 so she can help her remember all the information given about her needing open heart surgery. Please advise.

## 2019-11-25 ENCOUNTER — Encounter: Payer: Self-pay | Admitting: Thoracic Surgery (Cardiothoracic Vascular Surgery)

## 2019-11-25 ENCOUNTER — Other Ambulatory Visit: Payer: Self-pay | Admitting: Thoracic Surgery (Cardiothoracic Vascular Surgery)

## 2019-11-25 ENCOUNTER — Institutional Professional Consult (permissible substitution) (INDEPENDENT_AMBULATORY_CARE_PROVIDER_SITE_OTHER): Payer: Medicare Other | Admitting: Thoracic Surgery (Cardiothoracic Vascular Surgery)

## 2019-11-25 VITALS — BP 134/72 | HR 80 | Temp 98.1°F | Resp 20 | Ht 66.0 in | Wt 123.5 lb

## 2019-11-25 DIAGNOSIS — I480 Paroxysmal atrial fibrillation: Secondary | ICD-10-CM

## 2019-11-25 DIAGNOSIS — I341 Nonrheumatic mitral (valve) prolapse: Secondary | ICD-10-CM | POA: Insufficient documentation

## 2019-11-25 DIAGNOSIS — I340 Nonrheumatic mitral (valve) insufficiency: Secondary | ICD-10-CM

## 2019-11-25 NOTE — Patient Instructions (Addendum)
Continue all previous medications without any changes at this time  

## 2019-11-25 NOTE — H&P (View-Only) (Signed)
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  The left ventricle appears a little dilated but there appears to be normal left ventricular systolic function.  I agree the patient would best be treated with mitral valve repair and she would likely benefit from concomitant Maze procedure.  However, risks associated with surgery may be somewhat elevated in this patient because of severe physical deconditioning, limited mobility, severe anxiety, and chronic pain.  Whether or not she might be  candidate for minimally invasive approach may be affected by the patient's severe scoliosis and possible significant chest wall deformity.  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   Plan:  The patient and her sister were counseled at length regarding the indications, risks and potential benefits of mitral valve repair.  The rationale for elective surgery has been explained, including a comparison between surgery and continued medical therapy with close follow-up.  Alternative approaches such as conventional surgical mitral valve repair with or without use of minimally-invasive techniques, percutaneous edge-to-edge Mitraclip repair, and continued medical therapy without intervention were compared and contrasted at length.  The risks associated with conventional surgery were discussed in detail, as were expectations for post-operative convalescence, and why I might be reluctant to consider this patient a candidate for conventional surgery.  Issues specific to Mitraclip repair were discussed including questions about long term freedom from persistent or recurrent mitral regurgitation, the potential for device migration or embolization, and other technical complications related to the procedure itself.  The patient seems emotionally uncertain as to whether or not she could proceed with elective surgery because of the trauma she attributes to her multiple previous spine surgeries and their associated complications.  As a next step the patient has been scheduled for left and right heart catheterization sometime next week.  She will also undergo CT angiography to evaluate the feasibility of peripheral cannulation for surgery.  Pulmonary function test will be obtained and the patient will undergo formal physical therapy evaluation to include 6-minute walk test and frailty testing.  Finally, her TEE will be reviewed by a  multidisciplinary team of specialists to discuss treatment options further, including whether or not the patient's valve could be treated using transcatheter edge-to-edge repair.  All of her questions have been addressed.  Patient will return to our office in approximately 2 weeks to discuss the results of these tests and potentially decide on a strategy for her long-term management.   I spent in excess of 90 minutes during the conduct of this office consultation and >50% of this time involved direct face-to-face encounter with the patient for counseling and/or coordination of their care.    Valentina Gu. Roxy Manns, MD 11/25/2019 11:03 AM

## 2019-11-25 NOTE — Progress Notes (Signed)
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 likely benefit from concomitant Maze procedure.  However, risks associated with surgery may be somewhat elevated in this patient because of severe physical deconditioning, limited mobility, severe anxiety, and chronic pain.  Whether or not she might be  candidate for minimally invasive approach may be affected by the patient's severe scoliosis and possible significant chest wall deformity.  Any type of surgical intervention could be associated with an acute exacerbation of the patient's chronic pain related  to her spine.   Plan:  The patient and her sister were counseled at length regarding the indications, risks and potential benefits of mitral valve repair.  The rationale for elective surgery has been explained, including a comparison between surgery and continued medical therapy with close follow-up.  Alternative approaches such as conventional surgical mitral valve repair with or without use of minimally-invasive techniques, percutaneous edge-to-edge Mitraclip repair, and continued medical therapy without intervention were compared and contrasted at length.  The risks associated with conventional surgery were discussed in detail, as were expectations for post-operative convalescence, and why I might be reluctant to consider this patient a candidate for conventional surgery.  Issues specific to Mitraclip repair were discussed including questions about long term freedom from persistent or recurrent mitral regurgitation, the potential for device migration or embolization, and other technical complications related to the procedure itself.  The patient seems emotionally uncertain as to whether or not she could proceed with elective surgery because of the trauma she attributes to her multiple previous spine surgeries and their associated complications.  As a next step the patient has been scheduled for left and right heart catheterization sometime next week.  She will also undergo CT angiography to evaluate the feasibility of peripheral cannulation for surgery.  Pulmonary function test will be obtained and the patient will undergo formal physical therapy evaluation to include 6-minute walk test and frailty testing.  Finally, her TEE will be reviewed by a  multidisciplinary team of specialists to discuss treatment options further, including whether or not the patient's valve could be treated using transcatheter edge-to-edge repair.  All of her questions have been addressed.  Patient will return to our office in approximately 2 weeks to discuss the results of these tests and potentially decide on a strategy for her long-term management.   I spent in excess of 90 minutes during the conduct of this office consultation and >50% of this time involved direct face-to-face encounter with the patient for counseling and/or coordination of their care.    Valentina Gu. Roxy Manns, MD 11/25/2019 11:03 AM

## 2019-11-28 ENCOUNTER — Telehealth: Payer: Self-pay

## 2019-11-28 MED ORDER — DIPHENHYDRAMINE HCL 50 MG PO TABS
ORAL_TABLET | ORAL | 0 refills | Status: DC
Start: 2019-11-28 — End: 2019-12-19

## 2019-11-28 MED ORDER — PREDNISONE 50 MG PO TABS
ORAL_TABLET | ORAL | 0 refills | Status: DC
Start: 2019-11-28 — End: 2019-12-02

## 2019-11-28 NOTE — Telephone Encounter (Addendum)
Patient advised to premedicate before CT scans 12/15/19 due to contrast dye allergy, per Dr. Roxy Manns.  Patient called and had a lot of questions in regards to her upcoming appointments.  All questions answered.  Advised patient to contact the office if she has any further questions.  Patient acknowledged receipt.  Sent in Prednisone and Benadryl per protocol for patient to preferred pharmacy.

## 2019-11-29 ENCOUNTER — Other Ambulatory Visit (HOSPITAL_COMMUNITY)
Admission: RE | Admit: 2019-11-29 | Discharge: 2019-11-29 | Disposition: A | Discharge status: Home / Self Care | Payer: Medicare Other | Source: Ambulatory Visit | Attending: Cardiovascular Disease | Admitting: Cardiovascular Disease

## 2019-11-29 DIAGNOSIS — Z20822 Contact with and (suspected) exposure to covid-19: Secondary | ICD-10-CM | POA: Diagnosis not present

## 2019-11-29 DIAGNOSIS — Z01812 Encounter for preprocedural laboratory examination: Secondary | ICD-10-CM | POA: Diagnosis not present

## 2019-11-29 LAB — SARS CORONAVIRUS 2 (TAT 6-24 HRS): SARS Coronavirus 2: NEGATIVE

## 2019-12-01 ENCOUNTER — Ambulatory Visit (HOSPITAL_COMMUNITY)
Admission: RE | Admit: 2019-12-01 | Discharge: 2019-12-01 | Disposition: A | Discharge status: Home / Self Care | Payer: Medicare Other | Source: Ambulatory Visit | Attending: Thoracic Surgery (Cardiothoracic Vascular Surgery) | Admitting: Thoracic Surgery (Cardiothoracic Vascular Surgery)

## 2019-12-01 ENCOUNTER — Other Ambulatory Visit: Payer: Self-pay

## 2019-12-01 ENCOUNTER — Telehealth: Payer: Self-pay | Admitting: *Deleted

## 2019-12-01 ENCOUNTER — Encounter: Payer: Medicare Other | Admitting: Thoracic Surgery (Cardiothoracic Vascular Surgery)

## 2019-12-01 DIAGNOSIS — I340 Nonrheumatic mitral (valve) insufficiency: Secondary | ICD-10-CM | POA: Diagnosis not present

## 2019-12-01 DIAGNOSIS — J988 Other specified respiratory disorders: Secondary | ICD-10-CM | POA: Insufficient documentation

## 2019-12-01 LAB — PULMONARY FUNCTION TEST
DL/VA % pred: 105 %
DL/VA: 4.26 ml/min/mmHg/L
DLCO cor % pred: 71 %
DLCO cor: 14.56 ml/min/mmHg
DLCO unc % pred: 68 %
DLCO unc: 14.09 ml/min/mmHg
FEF 25-75 Pre: 1.14 L/sec
FEF2575-%Pred-Pre: 64 %
FEV1-%Pred-Pre: 68 %
FEV1-Pre: 1.57 L
FEV1FVC-%Pred-Pre: 98 %
FEV6-%Pred-Pre: 71 %
FEV6-Pre: 2.09 L
FEV6FVC-%Pred-Pre: 102 %
FVC-%Pred-Pre: 69 %
FVC-Pre: 2.13 L
Pre FEV1/FVC ratio: 74 %
Pre FEV6/FVC Ratio: 98 %
RV % pred: 140 %
RV: 3.35 L
TLC % pred: 100 %
TLC: 5.36 L

## 2019-12-01 MED ORDER — ALBUTEROL SULFATE (2.5 MG/3ML) 0.083% IN NEBU: 2.5000 mg | INHALATION_SOLUTION | Freq: Once | RESPIRATORY_TRACT | Status: DC

## 2019-12-01 NOTE — Telephone Encounter (Addendum)
Pt contacted pre-catheterization scheduled at [facility] for: Friday Dec 02, 2019 9 AM Verified arrival time and place: [facility]) at: 7 AM   No solid food after midnight prior to cath, clear liquids until 5 AM day of procedure. Contrast allergy: yes -13 hour Prednisone and Benadryl Prep: 12/01/19 Prednisone 50 mg 8 PM 12/02/19 Prednisone 50 mg 2 AM 12/02/19 Prednisone 50 mg and Benadryl 50 mg just prior to leaving for hospital. Pt advised not to drive to hospital.  Hold: Xarelto-none 11/30/19 until post procedure. Lasix/KCl-AM of procedure  Except hold medications AM meds can be  taken pre-cath with sip of water including: ASA 81 mg-pt states she has taken and tolerated aspirin in the past.   Confirmed patient has responsible adult to drive home post procedure and observe 24 hours after arriving home: yes   You are allowed ONE visitor in the waiting room during your procedure. Both you and your visitor must wear masks.      COVID-19 Pre-Screening Questions:  . In the past 7 to 10 days have you had a cough,  shortness of breath, headache, congestion, fever (100 or greater) body aches, chills, sore throat, or sudden loss of taste or sense of smell? Shortness of breath, not new . Have you been around anyone with known Covid 19 in the past 7 to 10 days? no . Have you been around anyone who is awaiting Covid 19 test results in the past 7 to 10 days? no . Have you been around anyone who has mentioned symptoms of Covid 19 within the past 7 to 10 days? no  Reviewed procedure/mask/visitor instructions, COVID-19 screening questions with patient.

## 2019-12-02 ENCOUNTER — Encounter (HOSPITAL_COMMUNITY)
Admission: RE | Disposition: A | Discharge status: Home / Self Care | Payer: Medicare Other | Source: Home / Self Care | Attending: Cardiovascular Disease

## 2019-12-02 ENCOUNTER — Ambulatory Visit (HOSPITAL_COMMUNITY)
Admission: RE | Admit: 2019-12-02 | Discharge: 2019-12-02 | Disposition: A | Discharge status: Home / Self Care | Payer: Medicare Other | Attending: Cardiovascular Disease | Admitting: Cardiovascular Disease

## 2019-12-02 DIAGNOSIS — G8929 Other chronic pain: Secondary | ICD-10-CM | POA: Insufficient documentation

## 2019-12-02 DIAGNOSIS — I340 Nonrheumatic mitral (valve) insufficiency: Secondary | ICD-10-CM | POA: Diagnosis not present

## 2019-12-02 DIAGNOSIS — F419 Anxiety disorder, unspecified: Secondary | ICD-10-CM | POA: Diagnosis not present

## 2019-12-02 DIAGNOSIS — Z7901 Long term (current) use of anticoagulants: Secondary | ICD-10-CM | POA: Insufficient documentation

## 2019-12-02 DIAGNOSIS — I480 Paroxysmal atrial fibrillation: Secondary | ICD-10-CM | POA: Diagnosis not present

## 2019-12-02 DIAGNOSIS — Z79899 Other long term (current) drug therapy: Secondary | ICD-10-CM | POA: Insufficient documentation

## 2019-12-02 DIAGNOSIS — G629 Polyneuropathy, unspecified: Secondary | ICD-10-CM | POA: Insufficient documentation

## 2019-12-02 DIAGNOSIS — M810 Age-related osteoporosis without current pathological fracture: Secondary | ICD-10-CM | POA: Insufficient documentation

## 2019-12-02 DIAGNOSIS — M419 Scoliosis, unspecified: Secondary | ICD-10-CM | POA: Diagnosis not present

## 2019-12-02 DIAGNOSIS — Z87891 Personal history of nicotine dependence: Secondary | ICD-10-CM | POA: Diagnosis not present

## 2019-12-02 DIAGNOSIS — K219 Gastro-esophageal reflux disease without esophagitis: Secondary | ICD-10-CM | POA: Diagnosis not present

## 2019-12-02 DIAGNOSIS — I2510 Atherosclerotic heart disease of native coronary artery without angina pectoris: Secondary | ICD-10-CM | POA: Diagnosis not present

## 2019-12-02 HISTORY — PX: RIGHT/LEFT HEART CATH AND CORONARY ANGIOGRAPHY: CATH118266

## 2019-12-02 LAB — POCT I-STAT 7, (LYTES, BLD GAS, ICA,H+H)
Acid-Base Excess: 5 mmol/L — ABNORMAL HIGH (ref 0.0–2.0)
Bicarbonate: 30.7 mmol/L — ABNORMAL HIGH (ref 20.0–28.0)
Calcium, Ion: 1.15 mmol/L (ref 1.15–1.40)
HCT: 34 % — ABNORMAL LOW (ref 36.0–46.0)
Hemoglobin: 11.6 g/dL — ABNORMAL LOW (ref 12.0–15.0)
O2 Saturation: 99 %
Potassium: 3.3 mmol/L — ABNORMAL LOW (ref 3.5–5.1)
Sodium: 143 mmol/L (ref 135–145)
TCO2: 32 mmol/L (ref 22–32)
pCO2 arterial: 48.4 mmHg — ABNORMAL HIGH (ref 32.0–48.0)
pH, Arterial: 7.409 (ref 7.350–7.450)
pO2, Arterial: 142 mmHg — ABNORMAL HIGH (ref 83.0–108.0)

## 2019-12-02 LAB — POCT I-STAT EG7
Acid-Base Excess: 7 mmol/L — ABNORMAL HIGH (ref 0.0–2.0)
Bicarbonate: 33.1 mmol/L — ABNORMAL HIGH (ref 20.0–28.0)
Calcium, Ion: 1.18 mmol/L (ref 1.15–1.40)
HCT: 35 % — ABNORMAL LOW (ref 36.0–46.0)
Hemoglobin: 11.9 g/dL — ABNORMAL LOW (ref 12.0–15.0)
O2 Saturation: 78 %
Potassium: 3.4 mmol/L — ABNORMAL LOW (ref 3.5–5.1)
Sodium: 142 mmol/L (ref 135–145)
TCO2: 35 mmol/L — ABNORMAL HIGH (ref 22–32)
pCO2, Ven: 51.3 mmHg (ref 44.0–60.0)
pH, Ven: 7.418 (ref 7.250–7.430)
pO2, Ven: 43 mmHg (ref 32.0–45.0)

## 2019-12-02 SURGERY — RIGHT/LEFT HEART CATH AND CORONARY ANGIOGRAPHY
Anesthesia: LOCAL

## 2019-12-02 MED ORDER — SODIUM CHLORIDE 0.9 % IV SOLN: 250.0000 mL | INTRAVENOUS | Status: DC | PRN

## 2019-12-02 MED ORDER — ASPIRIN 81 MG PO CHEW: 81.0000 mg | CHEWABLE_TABLET | ORAL | Status: DC

## 2019-12-02 MED ORDER — ONDANSETRON HCL 4 MG/2ML IJ SOLN: 4.0000 mg | Freq: Four times a day (QID) | INTRAMUSCULAR | Status: DC | PRN

## 2019-12-02 MED ORDER — LIDOCAINE HCL (PF) 1 % IJ SOLN
INTRAMUSCULAR | Status: AC
  Filled 2019-12-02: qty 30

## 2019-12-02 MED ORDER — VERAPAMIL HCL 2.5 MG/ML IV SOLN
INTRAVENOUS | Status: DC | PRN
  Administered 2019-12-02: 10 mL via INTRA_ARTERIAL

## 2019-12-02 MED ORDER — FENTANYL CITRATE (PF) 100 MCG/2ML IJ SOLN
INTRAMUSCULAR | Status: DC | PRN
  Administered 2019-12-02: 50 ug via INTRAVENOUS

## 2019-12-02 MED ORDER — SODIUM CHLORIDE 0.9% FLUSH: 3.0000 mL | Freq: Two times a day (BID) | INTRAVENOUS | Status: DC

## 2019-12-02 MED ORDER — LIDOCAINE HCL (PF) 1 % IJ SOLN
INTRAMUSCULAR | Status: DC | PRN
  Administered 2019-12-02: 5 mL

## 2019-12-02 MED ORDER — ACETAMINOPHEN 325 MG PO TABS: 650.0000 mg | ORAL_TABLET | ORAL | Status: DC | PRN

## 2019-12-02 MED ORDER — SODIUM CHLORIDE 0.9 % WEIGHT BASED INFUSION: 1.0000 mL/kg/h | INTRAVENOUS | Status: DC

## 2019-12-02 MED ORDER — MIDAZOLAM HCL 2 MG/2ML IJ SOLN
INTRAMUSCULAR | Status: AC
  Filled 2019-12-02: qty 2

## 2019-12-02 MED ORDER — FENTANYL CITRATE (PF) 100 MCG/2ML IJ SOLN
INTRAMUSCULAR | Status: AC
  Filled 2019-12-02: qty 2

## 2019-12-02 MED ORDER — SODIUM CHLORIDE 0.9 % WEIGHT BASED INFUSION
3.0000 mL/kg/h | INTRAVENOUS | Status: AC
  Administered 2019-12-02: 3 mL/kg/h via INTRAVENOUS

## 2019-12-02 MED ORDER — SODIUM CHLORIDE 0.9% FLUSH: 3.0000 mL | INTRAVENOUS | Status: DC | PRN

## 2019-12-02 MED ORDER — HEPARIN SODIUM (PORCINE) 1000 UNIT/ML IJ SOLN
INTRAMUSCULAR | Status: AC
  Filled 2019-12-02: qty 1

## 2019-12-02 MED ORDER — HEPARIN (PORCINE) IN NACL 1000-0.9 UT/500ML-% IV SOLN
INTRAVENOUS | Status: DC | PRN
  Administered 2019-12-02 (×2): 500 mL

## 2019-12-02 MED ORDER — VERAPAMIL HCL 2.5 MG/ML IV SOLN
INTRAVENOUS | Status: AC
  Filled 2019-12-02: qty 2

## 2019-12-02 MED ORDER — HEPARIN (PORCINE) IN NACL 1000-0.9 UT/500ML-% IV SOLN
INTRAVENOUS | Status: AC
  Filled 2019-12-02: qty 1000

## 2019-12-02 MED ORDER — SODIUM CHLORIDE 0.9 % IV SOLN: INTRAVENOUS | Status: AC

## 2019-12-02 MED ORDER — HYDRALAZINE HCL 20 MG/ML IJ SOLN: 10.0000 mg | INTRAMUSCULAR | Status: DC | PRN

## 2019-12-02 MED ORDER — LABETALOL HCL 5 MG/ML IV SOLN: 10.0000 mg | INTRAVENOUS | Status: DC | PRN

## 2019-12-02 MED ORDER — HEPARIN SODIUM (PORCINE) 1000 UNIT/ML IJ SOLN
INTRAMUSCULAR | Status: DC | PRN
  Administered 2019-12-02: 3000 [IU] via INTRAVENOUS

## 2019-12-02 MED ORDER — MIDAZOLAM HCL 2 MG/2ML IJ SOLN
INTRAMUSCULAR | Status: DC | PRN
  Administered 2019-12-02: 2 mg via INTRAVENOUS

## 2019-12-02 MED ORDER — IOHEXOL 350 MG/ML SOLN
INTRAVENOUS | Status: DC | PRN
  Administered 2019-12-02: 70 mL

## 2019-12-02 SURGICAL SUPPLY — 11 items

## 2019-12-02 NOTE — Interval H&P Note (Signed)
History and Physical Interval Note:  12/02/2019 8:37 AM  [name]  has presented today for surgery, with the diagnosis of mv insufficiency.  The various methods of treatment have been discussed with the patient and family. After consideration of risks, benefits and other options for treatment, the patient has consented to  Procedure(s): RIGHT/LEFT HEART CATH AND CORONARY ANGIOGRAPHY (N/A) as a surgical intervention.  The patient's history has been reviewed, patient examined, no change in status, stable for surgery.  I have reviewed the patient's chart and labs.  Questions were answered to the patient's satisfaction.    Cath Lab Visit (complete for each Cath Lab visit)  Clinical Evaluation Leading to the Procedure:   ACS: No.  Non-ACS:    Anginal Classification: No Symptoms  Anti-ischemic medical therapy: Maximal Therapy (2 or more classes of medications)  Non-Invasive Test Results: No non-invasive testing performed  Prior CABG: No previous CABG        Lauree Chandler

## 2019-12-02 NOTE — Discharge Instructions (Signed)
DRINK PLENTY OF FLUIDS FOR THE NEXT 2-3 DAYS.  KEEP ARM ELEVATED THE REMAINDER OF THE DAY.  Radial Site Care  This sheet gives you information about how to care for yourself after your procedure. Your health care provider may also give you more specific instructions. If you have problems or questions, contact your health care provider. What can I expect after the procedure? After the procedure, it is common to have:  Bruising and tenderness at the catheter insertion area. Follow these instructions at home: Medicines  Take over-the-counter and prescription medicines only as told by your health care provider. Insertion site care 1. Follow instructions from your health care provider about how to take care of your insertion site. Make sure you: ? Wash your hands with soap and water before you change your bandage (dressing). If soap and water are not available, use hand sanitizer. ? Change your dressing as told by your health care provider. 2. Check your insertion site every day for signs of infection. Check for: ? Redness, swelling, or pain. ? Fluid or blood. ? Pus or a bad smell. ? Warmth. 3. Do not take baths, swim, or use a hot tub for 5 days. 4. You may shower 24-48 hours after the procedure. ? Remove the dressing and gently wash the site with plain soap and water. ? Pat the area dry with a clean towel. ? Do not rub the site. That could cause bleeding. 5. Do not apply powder or lotion to the site. Activity  1. For 24 hours after the procedure, or as directed by your health care provider: ? Do not flex or bend the affected arm. ? Do not push or pull heavy objects with the affected arm. ? Do not drive yourself home from the hospital or clinic. You may drive 24 hours after the procedure. ? Do not operate machinery or power tools. 2. Do not push, pull or lift anything that is heavier than 10 lb for 5 days. 3. Ask your health care provider when it is okay to: ? Return to work or  school. ? Resume usual physical activities or sports. ? Resume sexual activity. General instructions  If the catheter site starts to bleed, raise your arm and put firm pressure on the site. If the bleeding does not stop, get help right away. This is a medical emergency.  If you went home on the same day as your procedure, a responsible adult should be with you for the first 24 hours after you arrive home.  Keep all follow-up visits as told by your health care provider. This is important. Contact a health care provider if:  You have a fever.  You have redness, swelling, or yellow drainage around your insertion site. Get help right away if:  You have unusual pain at the radial site.  The catheter insertion area swells very fast.  The insertion area is bleeding, and the bleeding does not stop when you hold steady pressure on the area.  Your arm or hand becomes pale, cool, tingly, or numb. These symptoms may represent a serious problem that is an emergency. Do not wait to see if the symptoms will go away. Get medical help right away. Call your local emergency services (911 in the [place] Do not drive yourself to the hospital. Summary  After the procedure, it is common to have bruising and tenderness at the site.  Follow instructions from your health care provider about how to take care of your radial site wound. Check   the wound every day for signs of infection.  Do not push, pull or lift anything that is heavier than 10 lb for 5 days.  This information is not intended to replace advice given to you by your health care provider. Make sure you discuss any questions you have with your health care provider. Document Revised: 07/29/2017 Document Reviewed: 07/29/2017 Elsevier Patient Education  2020 [employer] Inc[employer] 

## 2019-12-04 ENCOUNTER — Telehealth: Payer: Self-pay | Admitting: Physician Assistant

## 2019-12-04 NOTE — Telephone Encounter (Signed)
Paged by answering service.  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  Yesterday she was dizzy which is resolved.  Today's he is feeling more lethargic and not having any energy.  No chest pain, shortness of breath, dizziness, orthopnea, PND, syncope, lower extremity edema or melena.  She does not think that she is in atrial fibrillation.  She takes gabapentin for neuropathy and chronic pain medication oxycodone.  Never had lethargic issue in past.  She was previously medicated with prednisone and Benadryl prior to her cath but she did not have any symptoms yesterday.  Weather is cold today in 31s.  No strokelike symptoms. 1st BP 126/71 HR 99. 2nd - 132/70 HR 89. She does not think she is afib despite slight variation in HR but highly suspected.   I have advised her to rest and drink some coffee/tea.  Limit taking oxycodone and avoid Klonopin.  If symptoms worsen she will come to ER for further evaluation. She is agree with plan.

## 2019-12-06 ENCOUNTER — Telehealth: Payer: Self-pay

## 2019-12-06 NOTE — Telephone Encounter (Signed)
Patient called asking for instructions on how to take her 13 hour prep for her two CT scans on Thursday, December 15, 2019, with the first scan at 10:00a.m.  I informed her to take Prednisone 50mg  PO 12/14/19 @ 2200 and 12/15/19 @ 0400 and 1000.  Benadryl 50mg  PO 12/15/19 @ 1000.  I did clarify with her she may take all of her medications that morning and she may eat breakfast as long as she is done eating by 0700.

## 2019-12-07 ENCOUNTER — Telehealth: Payer: Self-pay | Admitting: Family Medicine

## 2019-12-07 NOTE — Telephone Encounter (Signed)
Spoke with patient she stated she was having some heart issues and did not feel like doing her AWV at this time and would like a call back next month

## 2019-12-08 ENCOUNTER — Telehealth (HOSPITAL_COMMUNITY): Payer: Self-pay | Admitting: *Deleted

## 2019-12-08 NOTE — Telephone Encounter (Signed)
Patient approved for patient assistance for Xarelto through 07/06/20. [id number]

## 2019-12-12 ENCOUNTER — Other Ambulatory Visit: Payer: Self-pay

## 2019-12-12 ENCOUNTER — Encounter: Payer: Self-pay | Admitting: Physical Therapy

## 2019-12-12 ENCOUNTER — Ambulatory Visit: Payer: Medicare Other | Attending: Thoracic Surgery (Cardiothoracic Vascular Surgery) | Admitting: Physical Therapy

## 2019-12-12 DIAGNOSIS — R2689 Other abnormalities of gait and mobility: Secondary | ICD-10-CM | POA: Diagnosis present

## 2019-12-12 NOTE — Therapy (Signed)
[place], [place], [address] Phone: [phone]   Fax:  [phone]  Physical Therapy Heart Evaluation  Patient Details  Name: [name] MRN: [id number] Date of Birth: 07-06-1946 Referring Provider (PT): Rexene Alberts, MD   Encounter Date: 12/12/2019  PT End of Session - 12/12/19 1310    Visit Number  1    Number of Visits  1    Authorization Type  MCR    PT Start Time  1216    PT Stop Time  1300    PT Time Calculation (min)  44 min    Equipment Utilized During Treatment  Gait belt    Activity Tolerance  Patient tolerated treatment well;Patient limited by fatigue    Behavior During Therapy  [facility] for tasks assessed/performed       Past Medical History:  Diagnosis Date  . Anxiety   . Arthritis    "maybe in my back" (03/31/2018)  . Benign paroxysmal positional vertigo 06/08/2013  . Fracture of multiple ribs 2015   "don't know from what; dx'd when I in hospital for 1st back OR" (03/31/2018)  . GERD (gastroesophageal reflux disease)   . Hair loss 04/12/2012  . Herpes   . History of blood transfusion    "twice; related to back OR" (03/31/2018)  . History of kidney stones   . Interstitial cystitis 11/06/2011  . 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 Mitral regurgitation   . Osteopenia 02/18/2012  . Osteoporosis   . PAF (paroxysmal atrial fibrillation) ([place]) 2012  . Peripheral neuropathy 11/06/2011  . PMDD (premenstrual dysphoric disorder)   . Seasonal allergies   . Vaginal delivery    ONE NSVD  . 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 ANTERIOR CERVICAL DECOMP/DISCECTOMY FUSION  ~ 2003  . BACK SURGERY    . BREAST SURGERY     BREAST BIOPSY--RIGHT BENIGN  . BUNIONECTOMY Bilateral   . COSMETIC SURGERY  2016   "back of my neck; related to earlier fusion"  . CYSTOSCOPY W/ STONE MANIPULATION  "several times"  . DILATION AND CURETTAGE OF UTERUS    .  FOREHEAD RECONSTRUCTION Right    "removed bone protruding out of my forehead"  . HARDWARE REMOVAL  2016   "related to neck OR"  . INCONTINENCE SURGERY    . POSTERIOR CERVICAL FUSION/FORAMINOTOMY  ~ 2008; 2015  . RIGHT/LEFT HEART CATH AND CORONARY ANGIOGRAPHY N/A 12/02/2019   Procedure: RIGHT/LEFT HEART CATH AND CORONARY ANGIOGRAPHY;  Surgeon: Burnell Blanks, MD;  Location: [facility];  Service: Cardiovascular;  Laterality: N/A;  . SHOULDER ARTHROSCOPY W/ ROTATOR CUFF REPAIR Right 2012  . SPINAL FUSION  06/2014 - 2018 X ?7   "scoliosis; my entire back"  . TEE WITHOUT CARDIOVERSION N/A 11/21/2019   Procedure: TRANSESOPHAGEAL ECHOCARDIOGRAM (TEE);  Surgeon: Josue Hector, MD;  Location: [facility] ENDOSCOPY;  Service: Cardiovascular;  Laterality: N/A;  . TUBAL LIGATION    . VAGINAL HYSTERECTOMY     TVH    There were no vitals filed for this visit.   Subjective Assessment - 12/12/19 1220    Subjective  Patient report she feels short-winded and breathing has been difficult with activity, which came on about 3 years ago. She also notes that she has had 7 surgeries on back and has been fused from her neck down to her pelvis which limits her mobility.    Pertinent  History  History of mitral regurgitation, recurrent paroxysmal atrial fibrillation on long-term anticoagulation, severe scoliosis for which she has undergone numerous surgical procedures, chronic pain on long-term oral narcotics, anxiety, osteoporosis, degenerative arthritis, GE reflux disease, and peripheral neuropathy    Limitations  Walking    How long can you sit comfortably?  20-30 minutes    How long can you stand comfortably?  5-10 minutes    How long can you walk comfortably?  5-10 minutes    Patient Stated Goals  Get heart better    Currently in Pain?  Yes    Pain Score  7     Pain Location  Neck    Pain Orientation  Right;Left    Pain Descriptors / Indicators  Sharp;Throbbing;Aching;Stabbing    Pain Type   Chronic pain    Pain Radiating Towards  top of back and shoulder [name]    Pain Onset  More than a month ago    Pain Frequency  Constant    Aggravating Factors   Housework or more strenuous work/exercise, sitting extended periods, walking extended periods    Pain Relieving Factors  Medication    Effect of Pain on Daily Activities  Patient limited with mobility         [facility] PT Assessment - 12/12/19 0001      Assessment   Medical Diagnosis  Severe mitral regurgitation    Referring Provider (PT)  Rexene Alberts, MD    Onset Date/Surgical Date  --   approximately 3 years ago   Hand Dominance  Right    Next MD Visit  12/19/2019    Prior Therapy  Yes      Precautions   Precautions  None      Restrictions   Weight Bearing Restrictions  No      Balance Screen   Has the patient fallen in the past 6 months  No    Has the patient had a decrease in activity level because of a fear of falling?   No    Is the patient reluctant to leave their home because of a fear of falling?   No      Home Environment   Living Environment  Private residence    Living Arrangements  Alone    Type of [place] to enter    Entrance Stairs-Number of Steps  3-4    Entrance Stairs-Rails  Right    Home Layout  Two level    Economy - single point    Additional Comments  Chair lift inside the home      Prior Function   Level of [place]   patient does have people to perform heavy housework   Vocation  Retired    Leisure  Midwife   Overall Cognitive Status  Within Functional Limits for tasks assessed      Observation/Other Assessments   Observations  Patient appears in no apparent distress    Focus on Therapeutic Outcomes (FOTO)   NA      Posture/Postural Control   Posture Comments  Patient exhibits rounded shoulder posture      ROM / Strength   AROM / PROM / Strength  AROM;Strength      AROM   Overall AROM Comments  AROM  of UE and LE grossly WFL, spinal movement limited secondary to fusion      Strength  Overall Strength Comments  Strength of UE and LE grossly 4/5 MMT throughout    Strength Assessment Site  Hand    Right/Left hand  Right;Left    Right Hand Grip (lbs)  35    Left Hand Grip (lbs)  20      Transfers   Transfers  Independent with all Transfers      Ambulation/Gait   Ambulation/Gait  Yes    Ambulation/Gait Assistance  7: Independent    Gait Comments  Decreased step length, crouched posture, slight forward trunk lean, decreased hip extension       [facility] Pre-Surgical Assessment - 12/12/19 0001    5 Meter Walk Test- trial 1  5 sec    5 Meter Walk Test- trial 2  6 sec.     5 Meter Walk Test- trial 3  5 sec.    5 meter walk test average  5.33 sec    4 Stage Balance Test tolerated for:   10 sec.    4 Stage Balance Test Position  2    Sit To Stand Test- trial 1  38 sec.    Comment  hands on top of thighs for assistance    ADL/IADL Independent with:  Bathing;Dressing;Meal prep;Finances    ADL/IADL Needs Assistance with:  Valla Leaver work    ADL/IADL Fraility Index  Vulnerable    Other comment  6/12 Roda Shutters Score    6 Minute Walk- Baseline  yes    BP (mmHg)  129/63    HR (bpm)  78    02 Sat (%RA)  95 %    Modified Borg Scale for Dyspnea  2- Mild shortness of breath    Perceived Rate of Exertion (Borg)  8-    6 Minute Walk Post Test  yes    BP (mmHg)  131/72    HR (bpm)  81    02 Sat (%RA)  97 %    Modified Borg Scale for Dyspnea  5- Strong or hard breathing    Perceived Rate of Exertion (Borg)  15- Hard    Aerobic Endurance Distance Walked  790                Objective measurements completed on examination: See above findings.              PT Education - 12/12/19 1309    Education Details  Improving posture with shoulder blade squeezes, performing sit<>stand throughout day and going up stairs to improve LE strength    Person(s) Educated  Patient    Methods   Explanation    Comprehension  Verbalized understanding                  Plan - 12/12/19 1311    Clinical Impression Statement  See below.    Personal Factors and Comorbidities  Age;Fitness;Time since onset of injury/illness/exacerbation;Comorbidity 3+    Comorbidities  History of mitral regurgitation, recurrent paroxysmal atrial fibrillation on long-term anticoagulation, severe scoliosis for which she has undergone numerous surgical procedures, chronic pain on long-term oral narcotics, anxiety, osteoporosis, degenerative arthritis, GE reflux disease, and peripheral neuropathy    Examination-Activity Limitations  Locomotion Level;Stairs;Stand    Examination-Participation Restrictions  Meal Prep;Cleaning;Community Activity;Shop;Yard Work;Laundry    Stability/Clinical Decision Making  Stable/Uncomplicated    Clinical Decision Making  Low    Rehab Potential  Good    PT Frequency  One time visit    PT Treatment/Interventions  ADLs/Self Care Home Management;Gait training;Stair training;Functional mobility training;Therapeutic activities;Therapeutic  exercise;Balance training;Neuromuscular re-education;Patient/family education;Passive range of motion;Energy conservation    PT Next Visit Plan  NA    PT Home Exercise Plan  NA    Consulted and Agree with Plan of Care  Patient       Clinical Impression Statement: Pt is a 74 yo female presenting to OP PT for evaluation prior to possible TAVR surgery due to severe aortic stenosis. Pt reports onset of shortness of breath with activity approximately 3 years ago. Symptoms are limiting walking and mobility. Pt presents with good ROM and gross strength impairment, good balance and is not at high fall risk 4 stage balance test, good walking speed and poor aerobic endurance per 6 minute walk test. Pt ambulated a total of 790 feet in 6 minute walk. Shortness of breath and RPE increased significantly with 6 minute walk test. Based on the Short Physical  Performance Battery, patient has a frailty rating of 6/12 with </= 5/12 considered frail.    Patient demonstrates the following deficits and impairments:  Abnormal gait, Difficulty walking, Decreased range of motion, Decreased strength, Postural dysfunction, Decreased balance, Decreased activity tolerance, Pain, Decreased endurance, Cardiopulmonary status limiting activity  Visit Diagnosis: Other abnormalities of gait and mobility     Problem List Patient Active Problem List   Diagnosis Date Noted  . Severe mitral regurgitation   . 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 Ankle swelling, left 10/20/2019  . Ankle swelling, right 10/20/2019  . Secondary hypercoagulable state ([place]) 08/19/2019  . Left inguinal hernia 03/11/2019  . Visit for monitoring Tikosyn therapy 03/29/2018  . Osteoporosis 09/23/2017  . Fatigue 02/18/2017  . Physical exam 04/21/2016  . Anxiety and depression 02/13/2016  . Chronic pain 02/13/2016  . Vertigo 11/16/2015  . B12 deficiency 10/01/2015  . Hearing loss due to cerumen impaction 08/23/2015  . Foot pain, right 05/23/2015  . Hyperlipidemia 04/19/2015  . Protein-calorie malnutrition ([place]) 10/19/2014  . Fracture of multiple ribs 08/07/2014  . Paroxysmal atrial fibrillation ([facility])   . Tachycardia   . Anemia 08/06/2014  . Constipation 07/14/2014  . Lumbar back pain 05/19/2012  . Seasonal allergic rhinitis 11/06/2011  . Neuropathy 11/06/2011  . S/P cervical spinal fusion 11/06/2011  . GERD (gastroesophageal reflux disease) 11/06/2011    [name], PT, DPT, LAT, ATC 12/12/19  1:16 PM Phone: [phone] Fax: [place] [facility] [address] [place], [place], [address] Phone: [phone]   Fax:  [phone]  Name: [name] MRN: [id number] Date of Birth: 1945/07/20

## 2019-12-15 ENCOUNTER — Ambulatory Visit
Admission: RE | Admit: 2019-12-15 | Discharge: 2019-12-15 | Disposition: A | Discharge status: Home / Self Care | Payer: Medicare Other | Source: Ambulatory Visit | Attending: Thoracic Surgery (Cardiothoracic Vascular Surgery) | Admitting: Thoracic Surgery (Cardiothoracic Vascular Surgery)

## 2019-12-15 DIAGNOSIS — I340 Nonrheumatic mitral (valve) insufficiency: Secondary | ICD-10-CM

## 2019-12-15 MED ORDER — IOPAMIDOL (ISOVUE-370) INJECTION 76%
75.0000 mL | Freq: Once | INTRAVENOUS | Status: AC | PRN
  Administered 2019-12-15: 75 mL via INTRAVENOUS

## 2019-12-19 ENCOUNTER — Other Ambulatory Visit: Payer: Self-pay | Admitting: *Deleted

## 2019-12-19 ENCOUNTER — Other Ambulatory Visit: Payer: Self-pay

## 2019-12-19 ENCOUNTER — Ambulatory Visit (INDEPENDENT_AMBULATORY_CARE_PROVIDER_SITE_OTHER): Payer: Medicare Other | Admitting: Thoracic Surgery (Cardiothoracic Vascular Surgery)

## 2019-12-19 VITALS — BP 128/66 | HR 85 | Temp 97.3°F | Resp 16 | Ht 66.0 in | Wt 123.5 lb

## 2019-12-19 DIAGNOSIS — I480 Paroxysmal atrial fibrillation: Secondary | ICD-10-CM

## 2019-12-19 DIAGNOSIS — I340 Nonrheumatic mitral (valve) insufficiency: Secondary | ICD-10-CM | POA: Diagnosis not present

## 2019-12-19 NOTE — Progress Notes (Signed)
[address]       [place] [address]             [phone]     CARDIOTHORACIC SURGERY OFFICE NOTE  Referring Provider is Josue Hector, MD Primary Cardiologist is No primary care provider on file. PCP is [name], MD   HPI:  Patient is a 74 year old female with history of mitral regurgitation, recurrent paroxysmal atrial fibrillation on long-term anticoagulation, severe scoliosis for which she has undergone numerous surgical procedures, chronic pain on long-term oral narcotics, anxiety, osteoporosis, degenerative arthritis, GE reflux disease, and peripheral neuropathy who returns to the office today to further discuss treatment options for management of mitral valve prolapse with severe symptomatic primary mitral regurgitation.  She was originally seen in consultation on Nov 25, 2019.  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  Left ventricle is somewhat dilated.  She would best be treated with mitral valve repair and Maze procedure, although risks of surgery would likely be somewhat elevated because of the patient's severe physical deconditioning, limited mobility, severe anxiety, and chronic pain.  Since she was seen at that time she underwent, diagnostic cardiac catheterization, formal physical therapy evaluation, pulmonary function testing, and CT angiography.  Catheterization revealed minimal nonobstructive coronary artery disease and only mildly elevated right heart pressures.  On physical therapy evaluation the patient was noted to have good walking speed but poor aerobic endurance during 6-minute walk test with primary limitations of exertional shortness of breath.  She had a frailty rating of 6/12 with less than or equal to 5/12 considered frail.  She had abnormal gait, difficulty walking, decreased range of  motion, decreased strength, decreased balance, and decreased endurance.  Pulmonary function testing revealed only mild obstructive disease, normal lung capacity, and minimal decrease in diffusion capacity at baseline.  CT angiography revealed no contraindications to peripheral cannulation for surgery, normal AP diameter of the chest and only mild leftward displacement of the heart due to her scoliosis and spinal anatomy.  She returns to the office today with her sister for office consultation follow-up visit today.  She reports no new problems or complaints over the past few weeks.  She seems much better today in terms of chronic anxiety and concerns about her underlying heart disease.   Current Outpatient Medications  Medication Sig Dispense Refill  . acetaminophen (TYLENOL) 500 MG tablet Take 500 mg by mouth every 6 (six) hours as needed for moderate pain.     [name] b complex vitamins tablet Take 1 tablet by mouth daily.    . Biotin 10 MG CAPS Take 10 mg by mouth daily.    . Calcium Carbonate Antacid (TUMS CHEWY BITES PO) Take 1 tablet by mouth daily as needed (reflux).     . Calcium Citrate-Vitamin D (CALCIUM + D PO) Take 1 tablet by mouth daily.    . Carboxymethylcellul-Glycerin (LUBRICATING EYE DROPS OP) Place 1 drop into both eyes daily as needed (dry eyes).    . cholecalciferol (VITAMIN D3) 25 MCG (1000 UNIT) tablet Take 1,000 Units by mouth daily.    . clonazePAM (KLONOPIN) 0.5 MG tablet Take 0.5 mg by mouth daily as needed for anxiety.    . cyclobenzaprine (FLEXERIL) 10 MG tablet Take 10 mg by mouth at bedtime as needed for muscle spasms.   5  . denosumab (PROLIA) 60  MG/ML SOSY injection Inject 60 mg into the skin every 6 (six) months.    . diltiazem (CARDIZEM CD) 240 MG 24 hr capsule Take 1 capsule (240 mg total) by mouth daily. 30 capsule 6  . dofetilide (TIKOSYN) 250 MCG capsule Take 1 capsule (250 mcg total) by mouth 2 (two) times daily. 180 capsule 3  . famotidine (PEPCID) 20 MG tablet  Take 1 tablet (20 mg total) by mouth daily. 90 tablet 1  . fluticasone (FLONASE) 50 MCG/ACT nasal spray Place 1 spray into both nostrils daily as needed for allergies or rhinitis.    . furosemide (LASIX) 20 MG tablet Take 1.5 tablets (30 mg total) by mouth daily. 45 tablet 3  . gabapentin (NEURONTIN) 100 MG capsule Take 100 mg by mouth 2 (two) times daily.     [name] lactulose (CONSTULOSE) 10 GM/15ML solution Take 20 g by mouth daily as needed for moderate constipation.     [name] levocetirizine (XYZAL) 5 MG tablet Take 5 mg by mouth daily.    . Magnesium 250 MG TABS Take 250 mg by mouth daily.    . meclizine (ANTIVERT) 25 MG tablet Take 1 tablet (25 mg total) by mouth 3 (three) times daily as needed for dizziness. 45 tablet 0  . metoprolol succinate (TOPROL XL) 25 MG 24 hr tablet Take 0.5 tablets (12.5 mg total) by mouth 2 (two) times daily.    [name] Nystatin POWD Apply small amount to affected area. 1 Bottle 3  . oxyCODONE (ROXICODONE) 5 MG/5ML solution Take 8 mg by mouth every 4 (four) hours as needed for moderate pain.     . polyethylene glycol (MIRALAX / GLYCOLAX) packet Take 17 g by mouth daily as needed for mild constipation.    . potassium chloride (K-DUR) 10 MEQ tablet Take 1 tablet (10 mEq total) by mouth daily. 90 tablet 3  . sodium chloride (OCEAN) 0.65 % SOLN nasal spray Place 1 spray into both nostrils as needed for congestion (nose bleeds).    Alveda Reasons 20 MG TABS tablet TAKE 1 TABLET BY MOUTH ONCE DAILY WITH SUPPER 30 tablet 5  . valACYclovir (VALTREX) 500 MG tablet Take 500 mg by mouth daily as needed (breakouts).      No current facility-administered medications for this visit.      Physical Exam:   BP 128/66 (BP Location: Left Arm, Patient Position: Sitting, Cuff Size: Normal)   Pulse 85   Temp (!) 97.3 F (36.3 C)   Resp 16   Ht 5\' 6"  (1.676 m)   Wt 123 lb 8 oz (56 kg)   SpO2 95% Comment: RA  BMI 19.93 kg/m   General:  Somewhat frail and weak but otherwise  well-appearing  Chest:   Clear to auscultation  CV:   Regular rate and rhythm with prominent holosystolic murmur  Incisions:  n/a  Abdomen:  Soft nontender  Extremities:  Warm and well-perfused, no edema  Diagnostic Tests:  RIGHT/LEFT HEART CATH AND CORONARY ANGIOGRAPHY  Conclusion    Prox RCA lesion is 20% stenosed.  Dist LAD lesion is 20% stenosed.   1. Mild non-obstructive CAD  Recommendation: Continue planning for mitral valve repair.    Recommendations  Antiplatelet/Anticoag Continue planning for mitral valve repair. Resume Xarelto tomorrow if no bleeding from cath sites.  Surgeon Notes    11/21/2019 8:47 AM CV Procedure signed by Josue Hector, MD  Indications  Severe mitral regurgitation [I34.0 (ICD-10-CM)]  Procedural Details  Technical Details Indication: Severe mitral regurgitation. Planning  for mitral valve repair.   Procedure: The risks, benefits, complications, treatment options, and expected outcomes were discussed with the patient. The patient and/or family concurred with the proposed plan, giving informed consent. The patient was brought to the cath lab after IV hydration was given. The patient was sedated with Versed and Fentanyl. The IV catheter present in the right antecubital vein was changed for a 5 [place] sheath. Right heart catheterization performed with a balloon tipped catheter. The right wrist was prepped and draped in a sterile fashion. 1% lidocaine was used for local anesthesia. Using the modified Seldinger access technique, a 5 French sheath was placed in the right radial artery. 3 mg Verapamil was given through the sheath. 3000 units IV heparin was given. Standard diagnostic catheters were used to perform selective coronary angiography. LV pressures measured with the JR4 catheter. The sheath was removed from the right radial artery and a Terumo hemostasis band was applied at the arteriotomy site on the right wrist.    Estimated blood loss <50  mL.   During this procedure medications were administered to achieve and maintain moderate conscious sedation while the patient's heart rate, blood pressure, and oxygen saturation were continuously monitored and I was present face-to-face 100% of this time.  Medications (Filter: Administrations occurring from [phone] to 0934 on 12/02/19) Heparin (Porcine) in NaCl 1000-0.9 UT/500ML-% SOLN (mL) Total volume:  1,000 mL Date/Time  Rate/Dose/Volume Action  12/02/19 0857  500 mL Given  0857  500 mL Given    fentaNYL (SUBLIMAZE) injection (mcg) Total dose:  50 mcg Date/Time  Rate/Dose/Volume Action  12/02/19 0903  50 mcg Given    midazolam (VERSED) injection (mg) Total dose:  2 mg Date/Time  Rate/Dose/Volume Action  12/02/19 0903  2 mg Given    lidocaine (PF) (XYLOCAINE) 1 % injection (mL) Total volume:  5 mL Date/Time  Rate/Dose/Volume Action  12/02/19 0915  5 mL Given    Radial Cocktail/Verapamil only (mL) Total volume:  10 mL Date/Time  Rate/Dose/Volume Action  12/02/19 0916  10 mL Given    heparin sodium (porcine) injection (Units) Total dose:  3,000 Units Date/Time  Rate/Dose/Volume Action  12/02/19 0920  3,000 Units Given    iohexol (OMNIPAQUE) 350 MG/ML injection (mL) Total volume:  70 mL Date/Time  Rate/Dose/Volume Action  12/02/19 0930  70 mL Given    Sedation Time  Sedation Time Physician-1: 25 minutes 2 seconds  Radiation/Fluoro  Fluoro time: 3.6 (min) DAP: 62229 (mGycm2) Cumulative Air Kerma: 798 (mGy)  Complications  Complications documented before study signed (12/02/2019 9:40 AM)   RIGHT/LEFT HEART CATH AND CORONARY ANGIOGRAPHY  None Documented by Burnell Blanks, MD 12/02/2019 9:35 AM  Date Found: 12/02/2019  Time Range: Intraprocedure      Coronary Findings  Diagnostic Dominance: Right Left Anterior Descending  Vessel is large.  Dist LAD lesion is 20% stenosed.  Left Circumflex  Vessel is small.  Right Coronary Artery  Vessel is  large.  Prox RCA lesion is 20% stenosed.  Intervention  No interventions have been documented. Coronary Diagrams  Diagnostic Dominance: Right  Intervention  Implants   No implant documentation for this case.  Syngo Images  Show images for CARDIAC CATHETERIZATION Images on Long Term Storage  Show images for Ross, Hefferan to Procedure Log  Procedure Log    Hemo Data   Most Recent Value  Fick Cardiac Output 5.84 L/min  Fick Cardiac Output Index 3.58 (L/min)/BSA  RA A Wave 2 mmHg  RA V  Wave 1 mmHg  RA Mean 0 mmHg  RV Systolic Pressure 29 mmHg  RV Diastolic Pressure -2 mmHg  RV EDP 3 mmHg  PA Systolic Pressure 32 mmHg  PA Diastolic Pressure 10 mmHg  PA Mean 21 mmHg  PW A Wave 22 mmHg  PW V Wave 20 mmHg  PW Mean 13 mmHg  AO Systolic Pressure 542 mmHg  AO Diastolic Pressure 61 mmHg  AO Mean 88 mmHg  LV Systolic Pressure 706 mmHg  LV Diastolic Pressure 1 mmHg  LV EDP 9 mmHg  AOp Systolic Pressure 237 mmHg  AOp Diastolic Pressure 66 mmHg  AOp Mean Pressure 93 mmHg  LVp Systolic Pressure 628 mmHg  LVp Diastolic Pressure 0 mmHg  LVp EDP Pressure 7 mmHg  QP/QS 1  TPVR Index 5.86 HRUI       CT ANGIOGRAPHY CHEST, ABDOMEN AND PELVIS  TECHNIQUE: Non-contrast CT of the chest was initially obtained.  Multidetector CT imaging through the chest, abdomen and pelvis was performed using the standard protocol during bolus administration of intravenous contrast. Multiplanar reconstructed images and MIPs were obtained and reviewed to evaluate the vascular anatomy.  CONTRAST:  26mL ISOVUE-370 IOPAMIDOL (ISOVUE-370) INJECTION 76%  COMPARISON:  CT abdomen pelvis, 02/20/2011  FINDINGS: CTA CHEST FINDINGS  Cardiovascular: Preferential opacification of the thoracic aorta. Normal contour and caliber of the thoracic aorta. No evidence of aneurysm or dissection. Minimal atherosclerosis of the arch. Mild cardiomegaly with enlargement of the left atrium. No  pericardial effusion.  Mediastinum/Nodes: No enlarged mediastinal, hilar, or axillary lymph nodes. Thyroid gland, trachea, and esophagus demonstrate no significant findings.  Lungs/Pleura: Clustered centrilobular nodular opacities and small consolidations of the right middle lobe and lingula. No pleural effusion or pneumothorax.  Musculoskeletal: No chest wall abnormality. Complete posterior thoracolumbar fusion.  Review of the MIP images confirms the above findings.  CTA ABDOMEN AND PELVIS FINDINGS  VASCULAR  Normal contour and caliber of the abdominal aorta. No evidence of aortic dissection, aneurysm, or other acute aortic pathology. Incidental note of a small inferior pole accessory left renal artery, with otherwise standard branching pattern. Minimal, scattered aortic atherosclerosis.  Review of the MIP images confirms the above findings.  NON-VASCULAR  Hepatobiliary: No solid liver abnormality is seen. No gallstones, gallbladder wall thickening, or biliary dilatation.  Pancreas: Unremarkable. No pancreatic ductal dilatation or surrounding inflammatory changes.  Spleen: Normal in size without significant abnormality.  Adrenals/Urinary Tract: Adrenal glands are unremarkable. Kidneys are normal, without renal calculi, solid lesion, or hydronephrosis. Bladder is unremarkable.  Stomach/Bowel: Stomach is within normal limits. Appendix appears normal. No evidence of bowel wall thickening, distention, or inflammatory changes.  Lymphatic: No enlarged abdominal or pelvic lymph nodes.  Reproductive: Status post hysterectomy.  Other: No abdominal wall hernia or abnormality. No abdominopelvic ascites.  Musculoskeletal: No acute osseous findings.  Review of the MIP images confirms the above findings.  IMPRESSION: 1. Normal contour and caliber of the thoracic and abdominal aorta. No evidence of aortic dissection, aneurysm, or other acute  aortic pathology. By report, patient has a history of aortic dissection, which is not evident on this examination. Comparison to prior imaging demonstrating this dissection may be helpful to assess for interval change. 2. Minimal, scattered aortic atherosclerosis. Aortic Atherosclerosis (ICD10-I70.0). 3. Clustered centrilobular nodular opacities and small consolidations of the right middle lobe and lingula, consistent with atypical infection, particularly atypical mycobacterium. 4. Cardiomegaly with enlargement of the left atrium. 5. Complete thoracolumbar fusion.   Electronically Signed   By: Dorna Bloom.D.  On: 12/15/2019 12:03    Impression:  Patient has mitral valve prolapse with stage D severe symptomatic primary mitral regurgitation and recurrent paroxysmal atrial fibrillation.  She describes a long history of worsening symptoms of exertional shortness of breath, fatigue, lower extremity edema and episodes of intermittent "smothering" consistent with chronic diastolic congestive heart failure, New York Heart Association functional class III.  She also has frequent palpitations and increased shortness of breath associated with episodes of recurrent paroxysmal atrial fibrillation.  I have personally reviewed the patient's recent transthoracic and transesophageal echocardiograms, diagnostic cardiac catheterization, pulmonary function tests, and CT angiograms. 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  The left ventricle appears a little dilated but there appears to be normal left ventricular systolic function.    Diagnostic cardiac catheterization reveals minimal nonobstructive coronary artery disease and only mildly elevated right heart pressures.  CT angiography reveals no contraindications to  peripheral cannulation for surgery, and chest wall anatomy does not appear to be prohibitive to minimally invasive approach.  Pulmonary function testing revealed only mild obstructive disease with normal total lung capacity and minimal reduction in diffusion capacity.  The patient was limited by exertional shortness of breath on 6-minute walk testing and did appear somewhat frail with formal physical therapy evaluation.   Plan:  The patient and her sister were again counseled at length regarding the indications, risks and potential benefits of mitral valve repair.  The rationale for elective surgery has been explained, including a comparison between surgery and continued medical therapy with close follow-up.  Alternative approaches such as conventional surgical mitral valve repair with or without use of minimally-invasive techniques, percutaneous edge-to-edge Mitraclip repair, and continued medical therapy without intervention were compared and contrasted at length.  The risks associated with conventional surgery were discussed in detail, as were expectations for post-operative convalescence, and why her recovery might be somewhat slow following conventional surgery.  The relative risks and benefits of performing a maze procedure at the time of their surgery was discussed at length, including the expected likelihood of long term freedom from recurrent symptomatic atrial fibrillation and/or atrial flutter.  Issues specific to Mitraclip repair were discussed including questions about long term freedom from persistent or recurrent mitral regurgitation, the potential for device migration or embolization, and other technical complications related to the procedure itself.  The patient desires to proceed with conventional surgical intervention for mitral valve repair and Maze procedure, preferably via minithoracotomy approach.  We plan to proceed with surgery on January 17, 2020.  The patient has been instructed to  stop taking Xarelto 5 days prior to surgery.  The patient understands and accepts all potential risks of surgery including but not limited to risk of death, stroke or other neurologic complication, myocardial infarction, congestive heart failure, respiratory failure, renal failure, bleeding requiring transfusion and/or reexploration, arrhythmia, infection or other wound complications, pneumonia, pleural and/or pericardial effusion, pulmonary embolus, aortic dissection or other major vascular complication, or delayed complications related to valve repair or replacement including but not limited to structural valve deterioration and failure, thrombosis, embolization, endocarditis, or paravalvular leak.  Specific risks potentially related to the minimally-invasive approach were discussed at length, including but not limited to risk of conversion to full or partial sternotomy, aortic dissection or other major vascular complication, unilateral acute lung injury or pulmonary edema, phrenic nerve dysfunction or paralysis,  rib fracture, chronic pain, lung hernia, or lymphocele. All of their questions have been answered.    I spent in excess of 30 minutes during the conduct of this office consultation and >50% of this time involved direct face-to-face encounter with the patient for counseling and/or coordination of their care.    Valentina Gu. Roxy Manns, MD 12/19/2019 10:13 AM

## 2019-12-19 NOTE — Patient Instructions (Signed)
Stop taking Xarelto 5 days prior to surgery on January 12, 2020  Continue taking all other medications without change through the day before surgery.  Make sure to bring all of your medications with you when you come for your Pre-Admission Testing appointment at [facility] Short-Stay Department.  Have nothing to eat or drink after midnight the night before surgery.  On the morning of surgery take only pepcid and Toprol-XL with a sip of water.  At your appointment for Pre-Admission Testing at the [facility] Short-Stay Department you will be asked to sign permission forms for your upcoming surgery.  By definition your signature on these forms implies that you and/or your designee provide full informed consent for your planned surgical procedure(s), that alternative treatment options have been discussed, that you understand and accept any and all potential risks, and that you have some understanding of what to expect for your post-operative convalescence.  For any major cardiac surgical procedure potential operative risks include but are not limited to at least some risk of death, stroke or other neurologic complication, myocardial infarction, congestive heart failure, respiratory failure, renal failure, bleeding requiring blood transfusion and/or reexploration, irregular heart rhythm, heart block or bradycardia requiring permanent pacemaker, pneumonia, pericardial effusion, pleural effusion, wound infection, pulmonary embolus or other thromboembolic complication, chronic pain, or other complications related to the specific procedure(s) performed.  Please call to schedule a follow-up appointment in our office prior to surgery if you have any unresolved questions about your planned surgical procedure, the associated risks, alternative treatment options, and/or expectations for your post-operative recovery.

## 2019-12-20 ENCOUNTER — Encounter: Payer: Self-pay | Admitting: *Deleted

## 2019-12-20 NOTE — Progress Notes (Deleted)
[facility]    Provider:  Dr Jaynee Eagles Requesting Provider: [name], MD Primary Care Provider:  [name], MD  CC:  ***  HPI:  [name] is a 74 y.o. female here as requested by [name], MD for neuropathy. PMHx neuropathy and she has been following with [facility] neurology Dr. Dolores Patty does not feel as though he explains things to her and she does not get her questions answered.  Currently on gabapentin, already has a pain management doctor, wants to know if there are additional medications or treatment that might improve her symptoms.  Reviewed notes, labs and imaging from outside physicians, which showed ***  Review of Systems: Patient complains of symptoms per HPI as well as the following symptoms ***. Pertinent negatives and positives per HPI. All others negative.   Social History   Socioeconomic History  . Marital status: Divorced    Spouse name: Not on file  . Number of children: Not on file  . Years of education: Not on file  . Highest education level: Not on file  Occupational History  . Not on file  Tobacco Use  . Smoking status: Former Smoker    Packs/day: 0.10    Years: 14.00    Pack years: 1.40    Types: Cigarettes  . Smokeless tobacco: Never Used  . Tobacco comment: 03/31/2018 "quit ~ 1980; someday smoker when I did smoke; never addicted"  Vaping Use  . Vaping Use: Never used  Substance and Sexual Activity  . Alcohol use: Never  . Drug use: Yes    Types: Oxycodone    Comment: 03/31/2018 "for chronic neck and back pain"  . Sexual activity: Not Currently  Other Topics Concern  . Not on file  Social History Narrative  . Not on file   Social Determinants of Health   Financial Resource Strain:   . Difficulty of Paying Living Expenses:   Food Insecurity:   . Worried About Charity fundraiser in the Last Year:   . Arboriculturist in the Last Year:   Transportation Needs:   . Film/video editor (Medical):    [name] Lack of Transportation (Non-Medical):   Physical Activity:   . Days of Exercise per Week:   . Minutes of Exercise per Session:   Stress:   . Feeling of Stress :   Social Connections:   . Frequency of Communication with Friends and Family:   . Frequency of Social Gatherings with Friends and Family:   . Attends Religious Services:   . Active Member of Clubs or Organizations:   . Attends Archivist Meetings:   [name] Marital Status:   Intimate Partner Violence:   . Fear of Current or Ex-Partner:   . Emotionally Abused:   [name] Physically Abused:   . Sexually Abused:     Family History  Problem Relation Age of Onset  . Diabetes Father   . Hyperlipidemia Sister   . Heart disease Sister   . Stroke Sister   . Diabetes Brother   . Hyperlipidemia Sister   . Heart disease Sister   . Arthritis Mother   . Heart disease Mother   . Uterine cancer Mother   . Diabetes Brother   . Heart Problems Brother     Past Medical History:  Diagnosis Date  . Anxiety   . Arthritis    "maybe in my back" (03/31/2018)  . Benign paroxysmal positional vertigo 06/08/2013  . Fracture of  multiple ribs 2015   "don't know from what; dx'd when I in hospital for 1st back OR" (03/31/2018)  . GERD (gastroesophageal reflux disease)   . Hair loss 04/12/2012  . Herpes   . History of blood transfusion    "twice; related to back OR" (03/31/2018)  . History of kidney stones   . Interstitial cystitis 11/06/2011  . 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 Mitral regurgitation   . Osteopenia 02/18/2012  . Osteoporosis   . PAF (paroxysmal atrial fibrillation) ([place]) 2012  . Peripheral neuropathy 11/06/2011  . PMDD (premenstrual dysphoric disorder)   . Seasonal allergies   . Vaginal delivery    ONE NSVD  . 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 Severe mitral regurgitation   . 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 Ankle swelling, left 10/20/2019  . Ankle  swelling, right 10/20/2019  . Secondary hypercoagulable state ([place]) 08/19/2019  . Left inguinal hernia 03/11/2019  . Visit for monitoring Tikosyn therapy 03/29/2018  . Osteoporosis 09/23/2017  . Fatigue 02/18/2017  . Physical exam 04/21/2016  . Anxiety and depression 02/13/2016  . Chronic pain 02/13/2016  . Vertigo 11/16/2015  . B12 deficiency 10/01/2015  . Hearing loss due to cerumen impaction 08/23/2015  . Foot pain, right 05/23/2015  . Hyperlipidemia 04/19/2015  . Protein-calorie malnutrition ([place]) 10/19/2014  . Fracture of multiple ribs 08/07/2014  . Paroxysmal atrial fibrillation ([facility])   . Tachycardia   . Anemia 08/06/2014  . Constipation 07/14/2014  . Lumbar back pain 05/19/2012  . Seasonal allergic rhinitis 11/06/2011  . Neuropathy 11/06/2011  . S/P cervical spinal fusion 11/06/2011  . GERD (gastroesophageal reflux disease) 11/06/2011    Past Surgical History:  Procedure Laterality Date  . ANTERIOR CERVICAL DECOMP/DISCECTOMY FUSION  ~ 2003  . BACK SURGERY    . BREAST SURGERY     BREAST BIOPSY--RIGHT BENIGN  . BUNIONECTOMY Bilateral   . COSMETIC SURGERY  2016   "back of my neck; related to earlier fusion"  . CYSTOSCOPY W/ STONE MANIPULATION  "several times"  . DILATION AND CURETTAGE OF UTERUS    . FOREHEAD RECONSTRUCTION Right    "removed bone protruding out of my forehead"  . HARDWARE REMOVAL  2016   "related to neck OR"  . INCONTINENCE SURGERY    . POSTERIOR CERVICAL FUSION/FORAMINOTOMY  ~ 2008; 2015  . RIGHT/LEFT HEART CATH AND CORONARY ANGIOGRAPHY N/A 12/02/2019   Procedure: RIGHT/LEFT HEART CATH AND CORONARY ANGIOGRAPHY;  Surgeon: Burnell Blanks, MD;  Location: [place] [facility];  Service: Cardiovascular;  Laterality: N/A;  . SHOULDER ARTHROSCOPY W/ ROTATOR CUFF REPAIR Right 2012  . SPINAL FUSION  06/2014 - 2018 X ?7   "scoliosis; my entire back"  . TEE WITHOUT CARDIOVERSION N/A 11/21/2019   Procedure: TRANSESOPHAGEAL ECHOCARDIOGRAM (TEE);   Surgeon: Josue Hector, MD;  Location: [facility] END[facility]COPY;  Service: Cardiovascular;  Laterality: N/A;  . TUBAL LIGATION    . VAGINAL HYSTERECTOMY     TVH    Current Outpatient Medications  Medication Sig Dispense Refill  . acetaminophen (TYLENOL) 500 MG tablet Take 500 mg by mouth every 6 (six) hours as needed for moderate pain.     [name] b complex vitamins tablet Take 1 tablet by mouth daily.    . Biotin 10 MG CAPS Take 10 mg by mouth daily.    . Calcium Carbonate Antacid (TUMS CHEWY BITES PO) Take 1 tablet by  mouth daily as needed (reflux).     . Calcium Citrate-Vitamin D (CALCIUM + D PO) Take 1 tablet by mouth daily.    . Carboxymethylcellul-Glycerin (LUBRICATING EYE DROPS OP) Place 1 drop into both eyes daily as needed (dry eyes).    . cholecalciferol (VITAMIN D3) 25 MCG (1000 UNIT) tablet Take 1,000 Units by mouth daily.    . clonazePAM (KLONOPIN) 0.5 MG tablet Take 0.5 mg by mouth daily as needed for anxiety.    . cyclobenzaprine (FLEXERIL) 10 MG tablet Take 10 mg by mouth at bedtime as needed for muscle spasms.   5  . denosumab (PROLIA) 60 MG/ML SOSY injection Inject 60 mg into the skin every 6 (six) months.    . diltiazem (CARDIZEM CD) 240 MG 24 hr capsule Take 1 capsule (240 mg total) by mouth daily. 30 capsule 6  . dofetilide (TIKOSYN) 250 MCG capsule Take 1 capsule (250 mcg total) by mouth 2 (two) times daily. 180 capsule 3  . famotidine (PEPCID) 20 MG tablet Take 1 tablet (20 mg total) by mouth daily. 90 tablet 1  . fluticasone (FLONASE) 50 MCG/ACT nasal spray Place 1 spray into both nostrils daily as needed for allergies or rhinitis.    . furosemide (LASIX) 20 MG tablet Take 1.5 tablets (30 mg total) by mouth daily. 45 tablet 3  . gabapentin (NEURONTIN) 100 MG capsule Take 100 mg by mouth 2 (two) times daily.     [name] lactulose (CONSTULOSE) 10 GM/15ML solution Take 20 g by mouth daily as needed for moderate constipation.     [name] levocetirizine (XYZAL) 5 MG tablet Take 5 mg by mouth daily.     . Magnesium 250 MG TABS Take 250 mg by mouth daily.    . meclizine (ANTIVERT) 25 MG tablet Take 1 tablet (25 mg total) by mouth 3 (three) times daily as needed for dizziness. 45 tablet 0  . metoprolol succinate (TOPROL XL) 25 MG 24 hr tablet Take 0.5 tablets (12.5 mg total) by mouth 2 (two) times daily.    [name] Nystatin POWD Apply small amount to affected area. 1 Bottle 3  . oxyCODONE (ROXICODONE) 5 MG/5ML solution Take 8 mg by mouth every 4 (four) hours as needed for moderate pain.     . polyethylene glycol (MIRALAX / GLYCOLAX) packet Take 17 g by mouth daily as needed for mild constipation.    . potassium chloride (K-DUR) 10 MEQ tablet Take 1 tablet (10 mEq total) by mouth daily. 90 tablet 3  . sodium chloride (OCEAN) 0.65 % SOLN nasal spray Place 1 spray into both nostrils as needed for congestion (nose bleeds).    . valACYclovir (VALTREX) 500 MG tablet Take 500 mg by mouth daily as needed (breakouts).     Alveda Reasons 20 MG TABS tablet TAKE 1 TABLET BY MOUTH ONCE DAILY WITH SUPPER 30 tablet 5   No current facility-administered medications for this visit.    Allergies as of 12/21/2019 - Review Complete 12/19/2019  Allergen Reaction Noted  . Buprenorphine Nausea Only and Other (See Comments) 10/01/2018  . Contrast media [iodinated diagnostic agents] Hives 10/29/2012  . Cymbalta [duloxetine hcl]  12/22/2017  . Erythromycin Nausea And Vomiting 01/09/2011  . Hydrocodone  10/01/2018  . Hydromorphone  10/01/2018  . Latex  01/09/2011  . Levofloxacin Nausea And Vomiting 06/07/2013  . Metrizamide Hives 06/19/2014  . Nsaids  09/12/2016  . Nucynta [tapentadol hcl]  10/01/2018  . Other  01/09/2011  . Oxycodone  08/15/2015  . Pentazocine  10/01/2018  . Septra [bactrim]  01/09/2011  . Sulfa antibiotics Hives 01/09/2011  . Sulfasalazine Hives 06/19/2014  . Adhesive [tape] Rash and Other (See Comments) 03/29/2018  . Morphine Anxiety 08/15/2015    Vitals: There were no vitals taken for this  visit. Last Weight:  Wt Readings from Last 1 Encounters:  12/19/19 123 lb 8 oz (56 kg)   Last Height:   Ht Readings from Last 1 Encounters:  12/19/19 5\' 6"  (1.676 m)     Physical exam: Exam: Gen: NAD, conversant, well nourised, obese, well groomed                     CV: RRR, no MRG. No Carotid Bruits. No peripheral edema, warm, nontender Eyes: Conjunctivae clear without exudates or hemorrhage  Neuro: Detailed Neurologic Exam  Speech:    Speech is normal; fluent and spontaneous with normal comprehension.  Cognition:    The patient is oriented to person, place, and time;     recent and remote memory intact;     language fluent;     normal attention, concentration,     fund of knowledge Cranial Nerves:    The pupils are equal, round, and reactive to light. The fundi are normal and spontaneous venous pulsations are present. Visual fields are full to finger confrontation. Extraocular movements are intact. Trigeminal sensation is intact and the muscles of mastication are normal. The face is symmetric. The palate elevates in the midline. Hearing intact. Voice is normal. Shoulder shrug is normal. The tongue has normal motion without fasciculations.   Coordination:    Normal finger to nose and heel to shin. Normal rapid alternating movements.   Gait:    Heel-toe and tandem gait are normal.   Motor Observation:    No asymmetry, no atrophy, and no involuntary movements noted. Tone:    Normal muscle tone.    Posture:    Posture is normal. normal erect    Strength:    Strength is V/V in the upper and lower limbs.      Sensation: intact to LT     Reflex Exam:  DTR's:    Deep tendon reflexes in the upper and lower extremities are normal bilaterally.   Toes:    The toes are downgoing bilaterally.   Clonus:    Clonus is absent.    Assessment/Plan:    No orders of the defined types were placed in this encounter.  No orders of the defined types were placed in this  encounter.   Cc: [name], MD,  [name], MD  Sarina Ill, MD  [facility] [address] [place], [place] [address]  Phone [phone] Fax [phone]

## 2019-12-21 ENCOUNTER — Ambulatory Visit: Payer: Medicare Other | Admitting: Neurology

## 2019-12-26 DIAGNOSIS — M4693 Unspecified inflammatory spondylopathy, cervicothoracic region: Secondary | ICD-10-CM | POA: Diagnosis not present

## 2019-12-26 DIAGNOSIS — Z79891 Long term (current) use of opiate analgesic: Secondary | ICD-10-CM | POA: Diagnosis not present

## 2019-12-26 DIAGNOSIS — G894 Chronic pain syndrome: Secondary | ICD-10-CM | POA: Diagnosis not present

## 2019-12-26 DIAGNOSIS — G500 Trigeminal neuralgia: Secondary | ICD-10-CM | POA: Diagnosis not present

## 2019-12-26 DIAGNOSIS — M961 Postlaminectomy syndrome, not elsewhere classified: Secondary | ICD-10-CM | POA: Diagnosis not present

## 2019-12-26 DIAGNOSIS — M5412 Radiculopathy, cervical region: Secondary | ICD-10-CM | POA: Diagnosis not present

## 2020-01-09 ENCOUNTER — Other Ambulatory Visit (HOSPITAL_COMMUNITY): Payer: Self-pay | Admitting: Nurse Practitioner

## 2020-01-12 NOTE — Pre-Procedure Instructions (Signed)
Your procedure is scheduled on Tuesday, July 13th.  Report to [facility] Main Entrance "A" at 5:30 A.M., and check in at the Admitting office.  Call this number if you have problems the morning of surgery:  [phone]  Call [phone] if you have any questions prior to your surgery date Monday-Friday 8am-4pm    Remember:  Do not eat or drink after midnight the night before your surgery    Take these medicines the morning of surgery with A SIP OF WATER  diltiazem (CARDIZEM CD) dofetilide (TIKOSYN)  metoprolol succinate (TOPROL-XL) oxyCODONE (ROXICODONE)-as needed levocetirizine (XYZAL)-as needed famotidine (PEPCID) -as needed fluticasone (FLONASE)-as needed gabapentin (NEURONTIN)-as needed acetaminophen (TYLENOL)-as needed Eye drops-as needed clonazePAM (KLONOPIN) -as needed valACYclovir (VALTREX) -as needed Nasal spray-as needed  Follow your surgeon's instructions on when to stop/resume XARELTO.  If no instructions were given by your surgeon then you will need to call the office to get those instructions.    As of today, STOP taking any Aspirin (unless otherwise instructed by your surgeon) Aleve, Naproxen, Ibuprofen, Motrin, Advil, Goody's, BC's, all herbal medications, fish oil, and all vitamins.                     Do not wear jewelry, make up, or nail polish            Do not wear lotions, powders, perfumes, or deodorant.            Do not shave 48 hours prior to surgery.            Do not bring valuables to the hospital.            [facility] is not responsible for any belongings or valuables.  Do NOT Smoke (Tobacco/Vaping) or drink Alcohol 24 hours prior to your procedure If you use a CPAP at night, you may bring all equipment for your overnight stay.   Contacts, glasses, dentures or bridgework may not be worn into surgery.      For patients admitted to the hospital, discharge time will be determined by your treatment team.   Patients discharged the day of  surgery will not be allowed to drive home, and someone needs to stay with them for 24 hours.    Special instructions:   [facility]- Preparing For Surgery  Before surgery, you can play an important role. Because skin is not sterile, your skin needs to be as free of germs as possible. You can reduce the number of germs on your skin by washing with CHG (chlorahexidine gluconate) Soap before surgery.  CHG is an antiseptic cleaner which kills germs and bonds with the skin to continue killing germs even after washing.    Oral Hygiene is also important to reduce your risk of infection.  Remember - BRUSH YOUR TEETH THE MORNING OF SURGERY WITH YOUR REGULAR TOOTHPASTE  Please do not use if you have an allergy to CHG or antibacterial soaps. If your skin becomes reddened/irritated stop using the CHG.  Do not shave (including legs and underarms) for at least 48 hours prior to first CHG shower. It is OK to shave your face.  Please follow these instructions carefully.   1. Shower the NIGHT BEFORE SURGERY and the MORNING OF SURGERY with CHG Soap.   2. If you chose to wash your hair, wash your hair first as usual with your normal shampoo.  3. After you shampoo, rinse your hair and body thoroughly to remove the shampoo.  4.  Use CHG as you would any other liquid soap. You can apply CHG directly to the skin and wash gently with a scrungie or a clean washcloth.   5. Apply the CHG Soap to your body ONLY FROM THE NECK DOWN.  Do not use on open wounds or open sores. Avoid contact with your eyes, ears, mouth and genitals (private parts). Wash Face and genitals (private parts)  with your normal soap.   6. Wash thoroughly, paying special attention to the area where your surgery will be performed.  7. Thoroughly rinse your body with warm water from the neck down.  8. DO NOT shower/wash with your normal soap after using and rinsing off the CHG Soap.  9. Pat yourself dry with a CLEAN TOWEL.  10. Wear CLEAN  PAJAMAS to bed the night before surgery  11. Place CLEAN SHEETS on your bed the night of your first shower and DO NOT SLEEP WITH PETS.   Day of Surgery: Wear Clean/Comfortable clothing the morning of surgery Do not apply any deodorants/lotions.   Remember to brush your teeth WITH YOUR REGULAR TOOTHPASTE.   Please read over the following fact sheets that you were given.

## 2020-01-13 ENCOUNTER — Ambulatory Visit (HOSPITAL_COMMUNITY)
Admission: RE | Admit: 2020-01-13 | Discharge: 2020-01-13 | Disposition: A | Discharge status: Home / Self Care | Payer: Medicare Other | Source: Ambulatory Visit | Attending: Thoracic Surgery (Cardiothoracic Vascular Surgery) | Admitting: Thoracic Surgery (Cardiothoracic Vascular Surgery)

## 2020-01-13 ENCOUNTER — Encounter (HOSPITAL_COMMUNITY)
Admission: RE | Admit: 2020-01-13 | Discharge: 2020-01-13 | Disposition: A | Discharge status: Home / Self Care | Payer: Medicare Other | Source: Ambulatory Visit | Attending: Thoracic Surgery (Cardiothoracic Vascular Surgery) | Admitting: Thoracic Surgery (Cardiothoracic Vascular Surgery)

## 2020-01-13 ENCOUNTER — Other Ambulatory Visit: Payer: Self-pay

## 2020-01-13 ENCOUNTER — Other Ambulatory Visit (HOSPITAL_COMMUNITY)
Admission: RE | Admit: 2020-01-13 | Discharge: 2020-01-13 | Disposition: A | Discharge status: Home / Self Care | Payer: Medicare Other | Source: Ambulatory Visit | Attending: Thoracic Surgery (Cardiothoracic Vascular Surgery) | Admitting: Thoracic Surgery (Cardiothoracic Vascular Surgery)

## 2020-01-13 ENCOUNTER — Encounter (HOSPITAL_COMMUNITY): Payer: Self-pay

## 2020-01-13 DIAGNOSIS — J449 Chronic obstructive pulmonary disease, unspecified: Secondary | ICD-10-CM | POA: Insufficient documentation

## 2020-01-13 DIAGNOSIS — Z20822 Contact with and (suspected) exposure to covid-19: Secondary | ICD-10-CM | POA: Diagnosis not present

## 2020-01-13 DIAGNOSIS — Z01818 Encounter for other preprocedural examination: Secondary | ICD-10-CM | POA: Insufficient documentation

## 2020-01-13 DIAGNOSIS — I480 Paroxysmal atrial fibrillation: Secondary | ICD-10-CM

## 2020-01-13 DIAGNOSIS — I340 Nonrheumatic mitral (valve) insufficiency: Secondary | ICD-10-CM

## 2020-01-13 HISTORY — DX: Other complications of anesthesia, initial encounter: T88.59XA

## 2020-01-13 LAB — URINALYSIS, ROUTINE W REFLEX MICROSCOPIC
Bilirubin Urine: NEGATIVE
Glucose, UA: NEGATIVE mg/dL
Hgb urine dipstick: NEGATIVE
Ketones, ur: NEGATIVE mg/dL
Nitrite: NEGATIVE
Protein, ur: NEGATIVE mg/dL
Specific Gravity, Urine: 1.018 (ref 1.005–1.030)
pH: 6 (ref 5.0–8.0)

## 2020-01-13 LAB — COMPREHENSIVE METABOLIC PANEL
ALT: 20 U/L (ref 0–44)
AST: 27 U/L (ref 15–41)
Albumin: 4.4 g/dL (ref 3.5–5.0)
Alkaline Phosphatase: 54 U/L (ref 38–126)
Anion gap: 10 (ref 5–15)
BUN: 24 mg/dL — ABNORMAL HIGH (ref 8–23)
CO2: 29 mmol/L (ref 22–32)
Calcium: 9.1 mg/dL (ref 8.9–10.3)
Chloride: 102 mmol/L (ref 98–111)
Creatinine, Ser: 0.76 mg/dL (ref 0.44–1.00)
GFR calc Af Amer: >60 mL/min (ref >60)
GFR calc non Af Amer: >60 mL/min (ref >60)
Glucose, Bld: 95 mg/dL (ref 70–99)
Potassium: 3.9 mmol/L (ref 3.5–5.1)
Sodium: 141 mmol/L (ref 135–145)
Total Bilirubin: 0.8 mg/dL (ref 0.3–1.2)
Total Protein: 7 g/dL (ref 6.5–8.1)

## 2020-01-13 LAB — CBC
HCT: 42.7 % (ref 36.0–46.0)
Hemoglobin: 13.5 g/dL (ref 12.0–15.0)
MCH: 30.3 pg (ref 26.0–34.0)
MCHC: 31.6 g/dL (ref 30.0–36.0)
MCV: 95.7 fL (ref 80.0–100.0)
Platelets: 202 10*3/uL (ref 150–400)
RBC: 4.46 MIL/uL (ref 3.87–5.11)
RDW: 13 % (ref 11.5–15.5)
WBC: 6.2 10*3/uL (ref 4.0–10.5)
nRBC: 0 % (ref 0.0–0.2)

## 2020-01-13 LAB — APTT: aPTT: 35 seconds (ref 24–36)

## 2020-01-13 LAB — PROTIME-INR
INR: 1 (ref 0.8–1.2)
Prothrombin Time: 12.4 seconds (ref 11.4–15.2)

## 2020-01-13 LAB — SURGICAL PCR SCREEN
MRSA, PCR: NEGATIVE
Staphylococcus aureus: NEGATIVE

## 2020-01-13 LAB — SARS CORONAVIRUS 2 (TAT 6-24 HRS): SARS Coronavirus 2: NEGATIVE

## 2020-01-13 LAB — HEMOGLOBIN A1C
Hgb A1c MFr Bld: 5.3 % (ref 4.8–5.6)
Mean Plasma Glucose: 105.41 mg/dL

## 2020-01-13 NOTE — Progress Notes (Signed)
PCP - Dr. [name] Cardiologist - [name]  Chest x-ray - 01/13/20 EKG - 01/13/20 Stress Test -2014? Records do not appear to be in Epic. Done by Dr. Wynonia Lawman  ECHO - 11/21/19 Cardiac Cath - 12/02/19  Sleep Study - denies CPAP - denies  Blood Thinner Instructions:Xarelto; hold 5 days pre-op. LD 01/11/20 Aspirin Instructions:n/a  COVID TEST- [facility] 01/13/20 before PAT appointment.    Anesthesia review: Yes, heart history.   Patient denies shortness of breath, fever, cough and chest pain at PAT appointment   All instructions explained to the patient, with a verbal understanding of the material. Patient agrees to go over the instructions while at home for a better understanding. Patient also instructed to self quarantine after being tested for COVID-19. The opportunity to ask questions was provided.    Coronavirus Screening  Have you experienced the following symptoms:  Cough yes/no: No Fever (>100.51F)  yes/no: No Runny nose yes/no: No Sore throat yes/no: No Difficulty breathing/shortness of breath  yes/no: No  Have you or a family member traveled in the last 14 days and where? yes/no: No   If the patient indicates "YES" to the above questions, their PAT will be rescheduled to limit the exposure to others and, the surgeon will be notified. THE PATIENT WILL NEED TO BE ASYMPTOMATIC FOR 14 DAYS.   If the patient is not experiencing any of these symptoms, the PAT nurse will instruct them to NOT bring anyone with them to their appointment since they may have these symptoms or traveled as well.   Please remind your patients and families that hospital visitation restrictions are in effect and the importance of the restrictions.

## 2020-01-13 NOTE — Progress Notes (Signed)
Pre-CABG (MVR) Dopplers completed. Refer to "CV Proc" under chart review to view preliminary results.  01/13/2020 10:56 AM Kelby Aline., MHA, RVT, RDCS, RDMS

## 2020-01-16 ENCOUNTER — Ambulatory Visit (INDEPENDENT_AMBULATORY_CARE_PROVIDER_SITE_OTHER): Payer: Medicare Other | Admitting: Thoracic Surgery (Cardiothoracic Vascular Surgery)

## 2020-01-16 ENCOUNTER — Encounter: Payer: Self-pay | Admitting: Thoracic Surgery (Cardiothoracic Vascular Surgery)

## 2020-01-16 ENCOUNTER — Other Ambulatory Visit: Payer: Self-pay

## 2020-01-16 VITALS — BP 144/69 | HR 83 | Temp 97.3°F | Resp 18 | Ht 66.0 in | Wt 126.6 lb

## 2020-01-16 DIAGNOSIS — I340 Nonrheumatic mitral (valve) insufficiency: Secondary | ICD-10-CM | POA: Diagnosis not present

## 2020-01-16 DIAGNOSIS — I480 Paroxysmal atrial fibrillation: Secondary | ICD-10-CM

## 2020-01-16 MED ORDER — SODIUM CHLORIDE 0.9 % IV SOLN
INTRAVENOUS | Status: DC
  Filled 2020-01-16: qty 30

## 2020-01-16 MED ORDER — MANNITOL 20 % IV SOLN
INTRAVENOUS | Status: DC
  Filled 2020-01-16: qty 13

## 2020-01-16 MED ORDER — TRANEXAMIC ACID 1000 MG/10ML IV SOLN
1.5000 mg/kg/h | INTRAVENOUS | Status: AC
  Administered 2020-01-17: 1.5 mg/kg/h via INTRAVENOUS
  Filled 2020-01-16: qty 25

## 2020-01-16 MED ORDER — MILRINONE LACTATE IN DEXTROSE 20-5 MG/100ML-% IV SOLN
0.3000 ug/kg/min | INTRAVENOUS | Status: AC
  Administered 2020-01-17: .375 ug/kg/min via INTRAVENOUS
  Filled 2020-01-16: qty 100

## 2020-01-16 MED ORDER — INSULIN REGULAR(HUMAN) IN NACL 100-0.9 UT/100ML-% IV SOLN
INTRAVENOUS | Status: AC
  Administered 2020-01-17: .6 [IU]/h via INTRAVENOUS
  Filled 2020-01-16: qty 100

## 2020-01-16 MED ORDER — DEXMEDETOMIDINE HCL IN NACL 400 MCG/100ML IV SOLN
0.1000 ug/kg/h | INTRAVENOUS | Status: AC
  Administered 2020-01-17: .7 ug/kg/h via INTRAVENOUS
  Filled 2020-01-16: qty 100

## 2020-01-16 MED ORDER — VANCOMYCIN HCL 1250 MG/250ML IV SOLN
1250.0000 mg | INTRAVENOUS | Status: AC
  Administered 2020-01-17: 1250 mg via INTRAVENOUS
  Filled 2020-01-16: qty 250

## 2020-01-16 MED ORDER — EPINEPHRINE HCL 5 MG/250ML IV SOLN IN NS
0.0000 ug/min | INTRAVENOUS | Status: DC
  Filled 2020-01-16: qty 250

## 2020-01-16 MED ORDER — TRANEXAMIC ACID (OHS) PUMP PRIME SOLUTION
2.0000 mg/kg | INTRAVENOUS | Status: DC
  Filled 2020-01-16: qty 1.14

## 2020-01-16 MED ORDER — TRANEXAMIC ACID (OHS) BOLUS VIA INFUSION
15.0000 mg/kg | INTRAVENOUS | Status: AC
  Administered 2020-01-17: 853.5 mg via INTRAVENOUS
  Filled 2020-01-16: qty 854

## 2020-01-16 MED ORDER — NITROGLYCERIN IN D5W 200-5 MCG/ML-% IV SOLN
2.0000 ug/min | INTRAVENOUS | Status: DC
  Filled 2020-01-16: qty 250

## 2020-01-16 MED ORDER — VANCOMYCIN HCL 1000 MG IV SOLR
INTRAVENOUS | Status: DC
  Filled 2020-01-16: qty 1000

## 2020-01-16 MED ORDER — PHENYLEPHRINE HCL-NACL 20-0.9 MG/250ML-% IV SOLN
30.0000 ug/min | INTRAVENOUS | Status: AC
  Administered 2020-01-17: 20 ug/min via INTRAVENOUS
  Filled 2020-01-16: qty 250

## 2020-01-16 MED ORDER — PLASMA-LYTE 148 IV SOLN
INTRAVENOUS | Status: DC
  Filled 2020-01-16: qty 2.5

## 2020-01-16 MED ORDER — POTASSIUM CHLORIDE 2 MEQ/ML IV SOLN
80.0000 meq | INTRAVENOUS | Status: DC
  Filled 2020-01-16: qty 40

## 2020-01-16 MED ORDER — GLUTARALDEHYDE 0.625% SOAKING SOLUTION
TOPICAL | Status: DC
  Filled 2020-01-16: qty 50

## 2020-01-16 MED ORDER — SODIUM CHLORIDE 0.9 % IV SOLN
1.5000 g | INTRAVENOUS | Status: AC
  Administered 2020-01-17: 1.5 g via INTRAVENOUS
  Filled 2020-01-16 (×2): qty 1.5

## 2020-01-16 MED ORDER — NOREPINEPHRINE 4 MG/250ML-% IV SOLN
0.0000 ug/min | INTRAVENOUS | Status: DC
  Filled 2020-01-16: qty 250

## 2020-01-16 MED ORDER — SODIUM CHLORIDE 0.9 % IV SOLN
750.0000 mg | INTRAVENOUS | Status: AC
  Administered 2020-01-17: 750 mg via INTRAVENOUS
  Filled 2020-01-16: qty 750

## 2020-01-16 NOTE — Progress Notes (Signed)
[address]       [place] [address]             [phone]     CARDIOTHORACIC SURGERY OFFICE NOTE  Referring Provider is Josue Hector, MD Primary Cardiologist is No primary care provider on file. PCP is [name], MD   HPI:  Patient is a 74 year old female with history of mitral regurgitation, recurrent paroxysmal atrial fibrillation on long-term anticoagulation, severe scoliosis for which she has undergone numerous surgical procedures, chronic pain on long-term oral narcotics, anxiety, osteoporosis, degenerative arthritis, GE reflux disease, and peripheral neuropathy who returns to the office today to further discuss treatment options for management of mitral valve prolapse with severe symptomatic primary mitral regurgitation.  She was originally seen in consultation on Nov 25, 2019 and she was seen more recently on December 19, 2019 at which time we made tentative plans for surgery.  She returns to our office today for final follow-up prior to surgery tomorrow.  She reports no new problems or complaints other than the fact that he has been quite anxious over the past week.  This has generated a headache as well as some increase in her chronic lower extremity pain.  She also notes that she is getting short of breath more easily and she cannot lay flat in bed.  She has had some increased lower extremity edema.   Current Outpatient Medications  Medication Sig Dispense Refill  . acetaminophen (TYLENOL) 500 MG tablet Take 500 mg by mouth every 6 (six) hours as needed for moderate pain.     [name] b complex vitamins tablet Take 1 tablet by mouth daily.    . Biotin 10 MG CAPS Take 10 mg by mouth daily.    . Calcium Carbonate Antacid (TUMS CHEWY BITES PO) Take 1 tablet by mouth daily as needed (reflux).     . Calcium Citrate-Vitamin D (CALCIUM + D PO) Take 1 tablet by mouth daily.    . Carboxymethylcellul-Glycerin (LUBRICATING EYE DROPS OP) Place 1 drop into both  eyes daily as needed (dry eyes).    . cholecalciferol (VITAMIN D3) 25 MCG (1000 UNIT) tablet Take 1,000 Units by mouth daily.    . clonazePAM (KLONOPIN) 0.5 MG tablet Take 0.5 mg by mouth daily as needed for anxiety.    . cyclobenzaprine (FLEXERIL) 10 MG tablet Take 10 mg by mouth at bedtime as needed for muscle spasms.   5  . denosumab (PROLIA) 60 MG/ML SOSY injection Inject 60 mg into the skin every 6 (six) months.    . diltiazem (CARDIZEM CD) 240 MG 24 hr capsule Take 1 capsule (240 mg total) by mouth daily. (Patient taking differently: Take 240 mg by mouth daily with supper. ) 30 capsule 6  . dofetilide (TIKOSYN) 250 MCG capsule Take 1 capsule (250 mcg total) by mouth 2 (two) times daily. 180 capsule 3  . famotidine (PEPCID) 20 MG tablet Take 1 tablet (20 mg total) by mouth daily. (Patient taking differently: Take 20 mg by mouth daily as needed for heartburn or indigestion. ) 90 tablet 1  . fluticasone (FLONASE) 50 MCG/ACT nasal spray Place 1 spray into both nostrils daily as needed for allergies or rhinitis.    . furosemide (LASIX) 20 MG tablet Take 1.5 tablets (30 mg total) by mouth daily. 45 tablet 3  . gabapentin (NEURONTIN) 100 MG capsule Take 100 mg by mouth 3 (three) times daily as needed (pain.).     [name]  lactulose (CONSTULOSE) 10 GM/15ML solution Take 20 g by mouth daily as needed for moderate constipation (constipation.).     [name] levocetirizine (XYZAL) 5 MG tablet Take 5 mg by mouth daily as needed for allergies.     . Magnesium 250 MG TABS Take 250 mg by mouth daily.    . meclizine (ANTIVERT) 25 MG tablet Take 1 tablet (25 mg total) by mouth 3 (three) times daily as needed for dizziness. 45 tablet 0  . metoprolol succinate (TOPROL-XL) 25 MG 24 hr tablet Take 1/2 (one-half) tablet by mouth twice daily 60 tablet 3  . Nystatin POWD Apply small amount to affected area. (Patient taking differently: Apply 1 application topically 2 (two) times daily as needed (skin irritation (under breasts)). ) 1  Bottle 3  . oxyCODONE (ROXICODONE) 5 MG/5ML solution Take 8 mg by mouth every 4 (four) hours as needed for moderate pain.     . polyethylene glycol (MIRALAX / GLYCOLAX) packet Take 17 g by mouth daily as needed for mild constipation.    . potassium chloride (K-DUR) 10 MEQ tablet Take 1 tablet (10 mEq total) by mouth daily. 90 tablet 3  . sodium chloride (OCEAN) 0.65 % SOLN nasal spray Place 1 spray into both nostrils as needed for congestion (nose bleeds).    . valACYclovir (VALTREX) 500 MG tablet Take 500 mg by mouth daily as needed (breakouts).     Alveda Reasons 20 MG TABS tablet TAKE 1 TABLET BY MOUTH ONCE DAILY WITH SUPPER (Patient not taking: Reported on 01/16/2020) 30 tablet 5   No current facility-administered medications for this visit.   Facility-Administered Medications Ordered in Other Visits  Medication Dose Route Frequency Provider Last Rate Last Admin  . [START ON 01/17/2020] cefUROXime (ZINACEF) 1.5 g in sodium chloride 0.9 % 100 mL IVPB  1.5 g Intravenous To OR Rexene Alberts, MD      . Derrill Memo ON 01/17/2020] cefUROXime (ZINACEF) 750 mg in sodium chloride 0.9 % 100 mL IVPB  750 mg Intravenous To OR Rexene Alberts, MD      . Derrill Memo ON 01/17/2020] dexmedetomidine (PRECEDEX) 400 MCG/100ML (4 mcg/mL) infusion  0.1-0.7 mcg/kg/hr Intravenous To OR Rexene Alberts, MD      . Derrill Memo ON 01/17/2020] EPINEPHrine (ADRENALIN) 4 mg in NS 250 mL (0.016 mg/mL) premix infusion  0-10 mcg/min Intravenous To OR Rexene Alberts, MD      . Derrill Memo ON 01/17/2020] glutaraldehyde 0.625% cardiac soaking solution   Topical To OR Rexene Alberts, MD      . Derrill Memo ON 01/17/2020] heparin 30,000 units/NS 1000 mL solution for CELLSAVER   Other To OR Rexene Alberts, MD      . Derrill Memo ON 01/17/2020] heparin sodium (porcine) 2,500 Units, papaverine 30 mg in electrolyte-148 (PLASMALYTE-148) 500 mL irrigation   Irrigation To OR Rexene Alberts, MD      . Derrill Memo ON 01/17/2020] insulin regular, human (MYXREDLIN) 100 units/  100 mL infusion   Intravenous To OR Rexene Alberts, MD      . Derrill Memo ON 01/17/2020] [facility] Blood Cardioplegia vial (lidocaine/magnesium/mannitol 0.26g-4g-6.4g)   Intracoronary To OR Rexene Alberts, MD      . Derrill Memo ON 01/17/2020] milrinone (PRIMACOR) 20 MG/100 ML (0.2 mg/mL) infusion  0.3 mcg/kg/min Intravenous To OR Rexene Alberts, MD      . Derrill Memo ON 01/17/2020] nitroGLYCERIN 50 mg in dextrose 5 % 250 mL (0.2 mg/mL) infusion  2-200 mcg/min Intravenous To OR Rexene Alberts, MD      . [  START ON 01/17/2020] norepinephrine (LEVOPHED) 4mg  in 239mL premix infusion  0-40 mcg/min Intravenous To OR Rexene Alberts, MD      . Derrill Memo ON 01/17/2020] phenylephrine (NEOSYNEPHRINE) 20-0.9 MG/250ML-% infusion  30-200 mcg/min Intravenous To OR Rexene Alberts, MD      . Derrill Memo ON 01/17/2020] potassium chloride injection 80 mEq  80 mEq Other To OR Rexene Alberts, MD      . Derrill Memo ON 01/17/2020] tranexamic acid (CYKLOKAPRON) 2,500 mg in sodium chloride 0.9 % 250 mL (10 mg/mL) infusion  1.5 mg/kg/hr Intravenous To OR Rexene Alberts, MD      . Derrill Memo ON 01/17/2020] tranexamic acid (CYKLOKAPRON) bolus via infusion - over 30 minutes 853.5 mg  15 mg/kg Intravenous To OR Rexene Alberts, MD      . Derrill Memo ON 01/17/2020] tranexamic acid (CYKLOKAPRON) pump prime solution 114 mg  2 mg/kg Intracatheter To OR Rexene Alberts, MD      . Derrill Memo ON 01/17/2020] vancomycin (VANCOCIN) 1,000 mg in sodium chloride 0.9 % 1,000 mL irrigation   Irrigation To OR Rexene Alberts, MD      . Derrill Memo ON 01/17/2020] vancomycin (VANCOREADY) IVPB 1250 mg/250 mL  1,250 mg Intravenous To OR Rexene Alberts, MD          Physical Exam:   BP (!) 144/69 (BP Location: Right Arm, Patient Position: Sitting, Cuff Size: Normal)   Pulse 83   Temp (!) 97.3 F (36.3 C)   Resp 18   Ht 5\' 6"  (1.676 m)   Wt 126 lb 9.6 oz (57.4 kg)   SpO2 94% Comment: RA  BMI 20.43 kg/m   General:  Anxious but well-appearing  Chest:   Clear to auscultation  with symmetrical breath sounds  CV:   Regular rate and rhythm with prominent holosystolic murmur  Incisions:  n/a  Abdomen:  Soft nontender  Extremities:  Warm and well-perfused with trace lower extremity edema  Diagnostic Tests:  CHEST - 2 VIEW  COMPARISON:  04/12/2019  FINDINGS: There is hyperinflation of the lungs compatible with COPD. Heart and mediastinal contours are within normal limits. No focal opacities or effusions. No acute bony abnormality. Posterior spinal rods and fusion noted throughout the thoracic spine.  IMPRESSION: COPD.  No active disease.   Electronically Signed   By: Rolm Baptise M.D.   On: 01/14/2020 16:02   Impression:  Patient has mitral valve prolapse with stage D severe symptomatic primary mitral regurgitation and recurrent paroxysmal atrial fibrillation. She describes a long history of worsening symptoms of exertional shortness of breath, fatigue, lower extremity edema and episodes of intermittent "smothering". Symptoms have reportedly gotten a bit worse over the past week or 2, consistent with acute exacerbation of chronic diastolic congestive heart failure, New York Heart Association functional class IIIb.  She also has frequent palpitations and increased shortness of breath associated with episodes ofrecurrent paroxysmal atrial fibrillation.  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 The left ventricle appears a little dilated but there appears to be normal left ventricular systolic function.   Diagnostic cardiac catheterization reveals minimal nonobstructive coronary artery disease  and only mildly elevated right heart pressures.  CT angiography reveals no contraindications to peripheral cannulation for surgery, and  chest wall anatomy does not appear to be prohibitive to minimally invasive approach.  Pulmonary function testing revealed only mild obstructive disease with normal total lung capacity and minimal reduction in diffusion capacity.  The patient was limited by exertional shortness of breath on 6-minute walk testing and did appear somewhat frail with formal physical therapy evaluation.   Plan:  The patientand her sister were againcounseled at length regarding the indications, risks and potential benefits of mitral valve repair. The rationale for elective surgery has been explained, including a comparison between surgery and continued medical therapy with close follow-up.Alternative approaches such as conventional surgical mitral valve repair with or without use ofminimally-invasive techniques, percutaneous edge-to-edge Mitraclip repair, and continued medical therapy without intervention were compared and contrasted at length. The risks associated with conventional surgery were discussed in detail, as were expectations for post-operative convalescence, and why her recovery might be somewhat slow following conventional surgery. The relative risks and benefits of performing a maze procedure at the time of their surgery was discussed at length, including the expected likelihood of long term freedom from recurrent symptomatic atrial fibrillation and/or atrial flutter.  Issues specific to Mitraclip repair were discussed including questions about long term freedom from persistent or recurrent mitral regurgitation, the potential for device migration or embolization, and other technical complications related to the procedure itself.   The patient understands and accepts all potential risks of surgery including but not limited to risk of death, stroke or other neurologic  complication, myocardial infarction, congestive heart failure, respiratory failure, renal failure, bleeding requiring transfusion and/or reexploration, arrhythmia, infection or other wound complications, pneumonia, pleural and/or pericardial effusion, pulmonary embolus, aortic dissection or other major vascular complication, or delayed complications related to valve repair or replacement including but not limited to structural valve deterioration and failure, thrombosis, embolization, endocarditis, or paravalvular leak.  Specific risks potentially related to the minimally-invasive approach were discussed at length, including but not limited to risk of conversion to full or partial sternotomy, aortic dissection or other major vascular complication, unilateral acute lung injury or pulmonary edema, phrenic nerve dysfunction or paralysis, rib fracture, chronic pain, lung hernia, or lymphocele. All of their questions have been answered.    I spent in excess of 15 minutes during the conduct of this office consultation and >50% of this time involved direct face-to-face encounter with the patient for counseling and/or coordination of their care.    Valentina Gu. Roxy Manns, MD 01/16/2020 1:00 PM

## 2020-01-16 NOTE — Patient Instructions (Signed)
Do not take Xarelto  Continue taking all other medications without change through the day before surgery.  Make sure to bring all of your medications with you when you come for your Pre-Admission Testing appointment at [facility] Short-Stay Department.  Have nothing to eat or drink after midnight the night before surgery.  On the morning of surgery take only Pepcid and Toprol XL with a sip of water.

## 2020-01-16 NOTE — H&P (Signed)
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 valve  regurgitation is mild.   Aorta: The aortic root is normal in size and structure.   IAS/Shunts: No atrial level shunt detected by color flow Doppler.       MR Peak grad:  110.7 mmHg  MR Mean grad:  67.0 mmHg  MR Vmax:    526.00 cm/s  MR Vmean:    380.0 cm/s  MR PISA:    12.32 cm  MR PISA Radius: 1.40 cm   [name] MD  Electronically signed by [name] MD  Signature Date/Time: 11/21/2019/8:44:08 AM     RIGHT/LEFT HEART CATH AND  CORONARY ANGIOGRAPHY  Conclusion    Prox RCA lesion is 20% stenosed.  Dist LAD lesion is 20% stenosed.  1. Mild non-obstructive CAD  Recommendation: Continue planning for mitral valve repair.    Recommendations  Antiplatelet/Anticoag Continue planning for mitral valve repair. Resume Xarelto tomorrow if no bleeding from cath sites.  Surgeon Notes    11/21/2019 8:47 AM CV Procedure signed by Josue Hector, MD  Indications  Severe mitral regurgitation [I34.0 (ICD-10-CM)]  Procedural Details  Technical Details Indication: Severe mitral regurgitation. Planning for mitral valve repair.   Procedure: The risks, benefits, complications, treatment options, and expected outcomes were discussed with the patient. The patient and/or family concurred with the proposed plan, giving informed consent. The patient was brought to the cath lab after IV hydration was given. The patient was sedated with Versed and Fentanyl. The IV catheter present in the right antecubital vein was changed for a 5 [place] sheath. Right heart catheterization performed with a balloon tipped catheter. The right wrist was prepped and draped in a sterile fashion. 1% lidocaine was used for local anesthesia. Using the modified Seldinger access technique, a 5 French sheath was placed in the right radial artery. 3 mg Verapamil was given through the sheath. 3000 units IV heparin was given. Standard diagnostic catheters were used to perform selective coronary angiography. LV pressures measured with the JR4 catheter. The sheath was removed from the right radial artery and a Terumo hemostasis band was applied at the  arteriotomy site on the right wrist.    Estimated blood loss <50 mL.   During this procedure medications were administered to achieve and maintain moderate conscious sedation while the patient's heart rate, blood pressure, and oxygen saturation were continuously monitored and I was present face-to-face 100% of this time.  Medications (Filter: Administrations occurring from [phone] to 0934 on 12/02/19) Heparin (Porcine) in NaCl 1000-0.9 UT/500ML-% SOLN (mL) Total volume:  1,000 mL Date/Time  Rate/Dose/Volume Action  12/02/19 0857  500 mL Given  0857  500 mL Given    fentaNYL (SUBLIMAZE) injection (mcg) Total dose:  50 mcg Date/Time  Rate/Dose/Volume Action  12/02/19 0903  50 mcg Given    midazolam (VERSED) injection (mg) Total dose:  2 mg Date/Time  Rate/Dose/Volume Action  12/02/19 0903  2 mg Given    lidocaine (PF) (XYLOCAINE) 1 % injection (mL) Total volume:  5 mL Date/Time  Rate/Dose/Volume Action  12/02/19 0915  5 mL Given    Radial Cocktail/Verapamil only (mL) Total volume:  10 mL Date/Time  Rate/Dose/Volume Action  12/02/19 0916  10 mL Given    heparin sodium (porcine) injection (Units) Total dose:  3,000 Units Date/Time  Rate/Dose/Volume Action  12/02/19 0920  3,000 Units Given    iohexol (OMNIPAQUE) 350 MG/ML injection (mL) Total volume:  70 mL Date/Time  Rate/Dose/Volume Action  12/02/19 0930  70 mL Given    Sedation Time  Sedation Time Physician-1: 25 minutes 2 seconds  Radiation/Fluoro  Fluoro time: 3.6 (min) DAP: 96045 (mGycm2) Cumulative Air Kerma: 409 (mGy)  Complications  Complications documented before study signed (12/02/2019 9:40 AM)   RIGHT/LEFT HEART CATH AND CORONARY ANGIOGRAPHY  None Documented by Burnell Blanks, MD 12/02/2019 9:35 AM  Date Found: 12/02/2019  Time Range: Intraprocedure      Coronary Findings  Diagnostic Dominance: Right Left Anterior Descending  Vessel is large.  Dist LAD lesion is  20% stenosed.  Left Circumflex  Vessel is small.  Right Coronary Artery  Vessel is large.  Prox RCA  lesion is 20% stenosed.  Intervention  No interventions have been documented. Coronary Diagrams  Diagnostic Dominance: Right  Intervention  Implants      No implant documentation for this case.  Syngo Images  Show images for CARDIAC CATHETERIZATION Images on Long Term Storage  Show images for Shaun, Zuccaro to Procedure Log  Procedure Log    Hemo Data   Most Recent Value  Fick Cardiac Output 5.84 L/min  Fick Cardiac Output Index 3.58 (L/min)/BSA  RA A Wave 2 mmHg  RA V Wave 1 mmHg  RA Mean 0 mmHg  RV Systolic Pressure 29 mmHg  RV Diastolic Pressure -2 mmHg  RV EDP 3 mmHg  PA Systolic Pressure 32 mmHg  PA Diastolic Pressure 10 mmHg  PA Mean 21 mmHg  PW A Wave 22 mmHg  PW V Wave 20 mmHg  PW Mean 13 mmHg  AO Systolic Pressure 106 mmHg  AO Diastolic Pressure 61 mmHg  AO Mean 88 mmHg  LV Systolic Pressure 269 mmHg  LV Diastolic Pressure 1 mmHg  LV EDP 9 mmHg  AOp Systolic Pressure 485 mmHg  AOp Diastolic Pressure 66 mmHg  AOp Mean Pressure 93 mmHg  LVp Systolic Pressure 462 mmHg  LVp Diastolic Pressure 0 mmHg  LVp EDP Pressure 7 mmHg  QP/QS 1  TPVR Index 5.86 HRUI       CT ANGIOGRAPHY CHEST, ABDOMEN AND PELVIS  TECHNIQUE: Non-contrast CT of the chest was initially obtained.  Multidetector CT imaging through the chest, abdomen and pelvis was performed using the standard protocol during bolus administration of intravenous contrast. Multiplanar reconstructed images and MIPs were obtained and reviewed to evaluate the vascular anatomy.  CONTRAST: 56mL ISOVUE-370 IOPAMIDOL (ISOVUE-370) INJECTION 76%  COMPARISON: CT abdomen pelvis, 02/20/2011  FINDINGS: CTA CHEST FINDINGS  Cardiovascular: Preferential opacification of the thoracic aorta. Normal contour and caliber of the thoracic aorta. No evidence of aneurysm or  dissection. Minimal atherosclerosis of the arch. Mild cardiomegaly with enlargement of the left atrium. No pericardial effusion.  Mediastinum/Nodes: No enlarged mediastinal, hilar, or axillary lymph nodes. Thyroid gland, trachea, and esophagus demonstrate no significant findings.  Lungs/Pleura: Clustered centrilobular nodular opacities and small consolidations of the right middle lobe and lingula. No pleural effusion or pneumothorax.  Musculoskeletal: No chest wall abnormality. Complete posterior thoracolumbar fusion.  Review of the MIP images confirms the above findings.  CTA ABDOMEN AND PELVIS FINDINGS  VASCULAR  Normal contour and caliber of the abdominal aorta. No evidence of aortic dissection, aneurysm, or other acute aortic pathology. Incidental note of a small inferior pole accessory left renal artery, with otherwise standard branching pattern. Minimal, scattered aortic atherosclerosis.  Review of the MIP images confirms the above findings.  NON-VASCULAR  Hepatobiliary: No solid liver abnormality is seen. No gallstones, gallbladder wall thickening, or biliary dilatation.  Pancreas: Unremarkable. No pancreatic ductal dilatation or surrounding inflammatory changes.  Spleen: Normal in size without significant abnormality.  Adrenals/Urinary Tract: Adrenal glands are unremarkable. Kidneys are normal, without renal calculi, solid lesion, or hydronephrosis. Bladder is unremarkable.  Stomach/Bowel: Stomach is within normal limits. Appendix appears normal. No evidence of bowel wall thickening, distention, or inflammatory changes.  Lymphatic: No enlarged abdominal or pelvic lymph nodes.  Reproductive: Status post hysterectomy.  Other: No abdominal wall hernia or abnormality. No abdominopelvic ascites.  Musculoskeletal: No acute osseous findings.  Review of the MIP images confirms the above findings.  IMPRESSION: 1. Normal contour and caliber  of the thoracic and abdominal aorta. No evidence of aortic  dissection, aneurysm, or other acute aortic pathology. By report, patient has a history of aortic dissection, which is not evident on this examination. Comparison to prior imaging demonstrating this dissection may be helpful to assess for interval change. 2. Minimal, scattered aortic atherosclerosis. Aortic Atherosclerosis (ICD10-I70.0). 3. Clustered centrilobular nodular opacities and small consolidations of the right middle lobe and lingula, consistent with atypical infection, particularly atypical mycobacterium. 4. Cardiomegaly with enlargement of the left atrium. 5. Complete thoracolumbar fusion.   Electronically Signed By: Eddie Candle M.D. On: 12/15/2019 12:03     Impression:  Patient has mitral valve prolapse with stage D severe symptomatic primary mitral regurgitation and recurrent paroxysmal atrial fibrillation. She describes a long history of worsening symptoms of exertional shortness of breath, fatigue, lower extremity edema and episodes of intermittent "smothering". Symptoms have reportedly gotten a bit worse over the past week or 2, consistent with acute exacerbation of chronic diastolic congestive heart failure, New York Heart Association functional class IIIb.  She also has frequent palpitations and increased shortness of breath associated with episodes ofrecurrent paroxysmal atrial fibrillation.  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 The left ventricle appears a little dilated but there appears to be normal left ventricular systolic  function.Diagnostic cardiac catheterization reveals minimal nonobstructive coronary artery disease and only mildly elevated right heart pressures.CT angiography reveals no contraindications to peripheral cannulation for surgery, and chest wall anatomy does not appear to be prohibitive to minimally invasive approach. Pulmonary function testing revealed only mild obstructive disease with normal total lung capacity and minimal reduction in diffusion capacity. The patient was limited by exertional shortness of breath on 6-minute walk testing and did appear somewhat frail with formal physical therapy evaluation.   Plan:  The patientand her sister wereagaincounseled at length regarding the indications, risks and potential benefits of mitral valve repair. The rationale for elective surgery has been explained, including a comparison between surgery and continued medical therapy with close follow-up.Alternative approaches such as conventional surgical mitral valve repair with or without use ofminimally-invasive techniques, percutaneous edge-to-edge Mitraclip repair, and continued medical therapy without intervention were compared and contrasted at length. The risks associated with conventional surgery were discussed in detail, as were expectations for post-operative convalescence, and whyher recovery might be somewhat slow followingconventional surgery. The relative risks and benefits of performing a maze procedure at the time of their surgery was discussed at length, including the expected likelihood of long term freedom from recurrent symptomatic atrial fibrillation and/or atrial flutter.Issues specific to Mitraclip repair were discussed including questions about long term freedom from persistent or recurrent mitral regurgitation, the potential for device migration or embolization, and other technical complications related to the procedure itself.   The patient understands and accepts all  potential risks of surgery including but not limited to risk of death, stroke or other neurologic complication, myocardial infarction, congestive heart failure, respiratory failure, renal failure, bleeding requiring transfusion and/or reexploration, arrhythmia, infection or other wound complications, pneumonia, pleural and/or pericardial effusion, pulmonary embolus, aortic dissection or other major vascular complication, or delayed complications related to valve repair or replacement including but not limited to structural valve deterioration and failure, thrombosis, embolization, endocarditis, or paravalvular leak. Specific risks potentially related to the minimally-invasive approach were discussed at length, including  but not limited to risk of conversion to full or partial sternotomy, aortic dissection or other major vascular complication, unilateral acute lung injury or pulmonary edema, phrenic nerve dysfunction or paralysis, rib fracture, chronic pain, lung hernia, or lymphocele. All of their questions have been answered.     Valentina Gu. Roxy Manns, MD 01/16/2020 1:00 PM

## 2020-01-17 ENCOUNTER — Inpatient Hospital Stay (HOSPITAL_COMMUNITY): Payer: Medicare Other

## 2020-01-17 ENCOUNTER — Inpatient Hospital Stay (HOSPITAL_COMMUNITY): Payer: Medicare Other | Admitting: Vascular Surgery

## 2020-01-17 ENCOUNTER — Encounter (HOSPITAL_COMMUNITY): Payer: Self-pay | Admitting: Thoracic Surgery (Cardiothoracic Vascular Surgery)

## 2020-01-17 ENCOUNTER — Encounter (HOSPITAL_COMMUNITY)
Admission: RE | Disposition: A | Discharge status: Home Health | Payer: Self-pay | Source: Home / Self Care | Attending: Thoracic Surgery (Cardiothoracic Vascular Surgery)

## 2020-01-17 ENCOUNTER — Inpatient Hospital Stay (HOSPITAL_COMMUNITY)
Admission: RE | Admit: 2020-01-17 | Discharge: 2020-02-02 | DRG: 220 | Disposition: A | Discharge status: Home Health | Payer: Medicare Other | Attending: Thoracic Surgery (Cardiothoracic Vascular Surgery) | Admitting: Thoracic Surgery (Cardiothoracic Vascular Surgery)

## 2020-01-17 DIAGNOSIS — G629 Polyneuropathy, unspecified: Secondary | ICD-10-CM | POA: Diagnosis not present

## 2020-01-17 DIAGNOSIS — R000 Tachycardia, unspecified: Secondary | ICD-10-CM | POA: Diagnosis not present

## 2020-01-17 DIAGNOSIS — Z881 Allergy status to other antibiotic agents status: Secondary | ICD-10-CM

## 2020-01-17 DIAGNOSIS — Z981 Arthrodesis status: Secondary | ICD-10-CM | POA: Diagnosis not present

## 2020-01-17 DIAGNOSIS — Z833 Family history of diabetes mellitus: Secondary | ICD-10-CM | POA: Diagnosis not present

## 2020-01-17 DIAGNOSIS — Z83438 Family history of other disorder of lipoprotein metabolism and other lipidemia: Secondary | ICD-10-CM | POA: Diagnosis not present

## 2020-01-17 DIAGNOSIS — M810 Age-related osteoporosis without current pathological fracture: Secondary | ICD-10-CM | POA: Diagnosis present

## 2020-01-17 DIAGNOSIS — I2510 Atherosclerotic heart disease of native coronary artery without angina pectoris: Secondary | ICD-10-CM | POA: Diagnosis present

## 2020-01-17 DIAGNOSIS — Z91041 Radiographic dye allergy status: Secondary | ICD-10-CM

## 2020-01-17 DIAGNOSIS — E8770 Fluid overload, unspecified: Secondary | ICD-10-CM | POA: Diagnosis not present

## 2020-01-17 DIAGNOSIS — Z886 Allergy status to analgesic agent status: Secondary | ICD-10-CM | POA: Diagnosis not present

## 2020-01-17 DIAGNOSIS — I9751 Accidental puncture and laceration of a circulatory system organ or structure during a circulatory system procedure: Secondary | ICD-10-CM | POA: Diagnosis not present

## 2020-01-17 DIAGNOSIS — I4819 Other persistent atrial fibrillation: Secondary | ICD-10-CM | POA: Diagnosis present

## 2020-01-17 DIAGNOSIS — I083 Combined rheumatic disorders of mitral, aortic and tricuspid valves: Principal | ICD-10-CM | POA: Diagnosis present

## 2020-01-17 DIAGNOSIS — D62 Acute posthemorrhagic anemia: Secondary | ICD-10-CM | POA: Diagnosis not present

## 2020-01-17 DIAGNOSIS — Z8249 Family history of ischemic heart disease and other diseases of the circulatory system: Secondary | ICD-10-CM | POA: Diagnosis not present

## 2020-01-17 DIAGNOSIS — Z8261 Family history of arthritis: Secondary | ICD-10-CM

## 2020-01-17 DIAGNOSIS — Z87891 Personal history of nicotine dependence: Secondary | ICD-10-CM | POA: Diagnosis not present

## 2020-01-17 DIAGNOSIS — K219 Gastro-esophageal reflux disease without esophagitis: Secondary | ICD-10-CM | POA: Diagnosis present

## 2020-01-17 DIAGNOSIS — M1990 Unspecified osteoarthritis, unspecified site: Secondary | ICD-10-CM | POA: Diagnosis present

## 2020-01-17 DIAGNOSIS — I959 Hypotension, unspecified: Secondary | ICD-10-CM | POA: Diagnosis not present

## 2020-01-17 DIAGNOSIS — Z79899 Other long term (current) drug therapy: Secondary | ICD-10-CM

## 2020-01-17 DIAGNOSIS — I480 Paroxysmal atrial fibrillation: Secondary | ICD-10-CM | POA: Diagnosis not present

## 2020-01-17 DIAGNOSIS — Y713 Surgical instruments, materials and cardiovascular devices (including sutures) associated with adverse incidents: Secondary | ICD-10-CM | POA: Diagnosis not present

## 2020-01-17 DIAGNOSIS — I10 Essential (primary) hypertension: Secondary | ICD-10-CM | POA: Diagnosis not present

## 2020-01-17 DIAGNOSIS — Z885 Allergy status to narcotic agent status: Secondary | ICD-10-CM

## 2020-01-17 DIAGNOSIS — F1120 Opioid dependence, uncomplicated: Secondary | ICD-10-CM | POA: Diagnosis present

## 2020-01-17 DIAGNOSIS — F419 Anxiety disorder, unspecified: Secondary | ICD-10-CM | POA: Diagnosis present

## 2020-01-17 DIAGNOSIS — M549 Dorsalgia, unspecified: Secondary | ICD-10-CM | POA: Diagnosis not present

## 2020-01-17 DIAGNOSIS — Z8049 Family history of malignant neoplasm of other genital organs: Secondary | ICD-10-CM

## 2020-01-17 DIAGNOSIS — E876 Hypokalemia: Secondary | ICD-10-CM | POA: Diagnosis not present

## 2020-01-17 DIAGNOSIS — I340 Nonrheumatic mitral (valve) insufficiency: Secondary | ICD-10-CM | POA: Diagnosis not present

## 2020-01-17 DIAGNOSIS — Z7901 Long term (current) use of anticoagulants: Secondary | ICD-10-CM

## 2020-01-17 DIAGNOSIS — Z823 Family history of stroke: Secondary | ICD-10-CM

## 2020-01-17 DIAGNOSIS — J9811 Atelectasis: Secondary | ICD-10-CM | POA: Diagnosis not present

## 2020-01-17 DIAGNOSIS — D689 Coagulation defect, unspecified: Secondary | ICD-10-CM | POA: Diagnosis not present

## 2020-01-17 DIAGNOSIS — Z882 Allergy status to sulfonamides status: Secondary | ICD-10-CM

## 2020-01-17 DIAGNOSIS — G8929 Other chronic pain: Secondary | ICD-10-CM | POA: Diagnosis not present

## 2020-01-17 DIAGNOSIS — Z91040 Latex allergy status: Secondary | ICD-10-CM | POA: Diagnosis not present

## 2020-01-17 DIAGNOSIS — S2692XA Laceration of heart, unspecified with or without hemopericardium, initial encounter: Secondary | ICD-10-CM | POA: Diagnosis not present

## 2020-01-17 DIAGNOSIS — Y658 Other specified misadventures during surgical and medical care: Secondary | ICD-10-CM | POA: Diagnosis not present

## 2020-01-17 DIAGNOSIS — D519 Vitamin B12 deficiency anemia, unspecified: Secondary | ICD-10-CM | POA: Diagnosis present

## 2020-01-17 DIAGNOSIS — Z9889 Other specified postprocedural states: Secondary | ICD-10-CM

## 2020-01-17 DIAGNOSIS — I482 Chronic atrial fibrillation, unspecified: Secondary | ICD-10-CM | POA: Diagnosis not present

## 2020-01-17 DIAGNOSIS — Z888 Allergy status to other drugs, medicaments and biological substances status: Secondary | ICD-10-CM

## 2020-01-17 DIAGNOSIS — R5381 Other malaise: Secondary | ICD-10-CM | POA: Diagnosis not present

## 2020-01-17 DIAGNOSIS — I4891 Unspecified atrial fibrillation: Secondary | ICD-10-CM | POA: Diagnosis not present

## 2020-01-17 DIAGNOSIS — J90 Pleural effusion, not elsewhere classified: Secondary | ICD-10-CM | POA: Diagnosis not present

## 2020-01-17 DIAGNOSIS — F32A Depression, unspecified: Secondary | ICD-10-CM | POA: Diagnosis present

## 2020-01-17 DIAGNOSIS — S2231XA Fracture of one rib, right side, initial encounter for closed fracture: Secondary | ICD-10-CM | POA: Diagnosis not present

## 2020-01-17 DIAGNOSIS — I4892 Unspecified atrial flutter: Secondary | ICD-10-CM | POA: Diagnosis not present

## 2020-01-17 DIAGNOSIS — R079 Chest pain, unspecified: Secondary | ICD-10-CM | POA: Diagnosis not present

## 2020-01-17 DIAGNOSIS — E785 Hyperlipidemia, unspecified: Secondary | ICD-10-CM | POA: Diagnosis not present

## 2020-01-17 DIAGNOSIS — Z8679 Personal history of other diseases of the circulatory system: Secondary | ICD-10-CM

## 2020-01-17 HISTORY — PX: MITRAL VALVE REPAIR: SHX2039

## 2020-01-17 HISTORY — PX: TEE WITHOUT CARDIOVERSION: SHX5443

## 2020-01-17 HISTORY — DX: Other specified postprocedural states: Z98.890

## 2020-01-17 HISTORY — PX: MINIMALLY INVASIVE MAZE PROCEDURE: SHX6244

## 2020-01-17 HISTORY — DX: Personal history of other diseases of the circulatory system: Z86.79

## 2020-01-17 LAB — POCT I-STAT 7, (LYTES, BLD GAS, ICA,H+H)
Acid-base deficit: 1 mmol/L (ref 0.0–2.0)
Bicarbonate: 25.3 mmol/L (ref 20.0–28.0)
Calcium, Ion: 1.1 mmol/L — ABNORMAL LOW (ref 1.15–1.40)
HCT: 19 % — ABNORMAL LOW (ref 36.0–46.0)
Hemoglobin: 6.5 g/dL — CL (ref 12.0–15.0)
O2 Saturation: 98 %
Patient temperature: 33.3
Potassium: 3.2 mmol/L — ABNORMAL LOW (ref 3.5–5.1)
Sodium: 147 mmol/L — ABNORMAL HIGH (ref 135–145)
TCO2: 27 mmol/L (ref 22–32)
pCO2 arterial: 41.8 mmHg (ref 32.0–48.0)
pH, Arterial: 7.372 (ref 7.350–7.450)
pO2, Arterial: 98 mmHg (ref 83.0–108.0)

## 2020-01-17 LAB — HEMOGLOBIN AND HEMATOCRIT, BLOOD
HCT: 22.6 % — ABNORMAL LOW (ref 36.0–46.0)
HCT: 23.5 % — ABNORMAL LOW (ref 36.0–46.0)
HCT: 23.9 % — ABNORMAL LOW (ref 36.0–46.0)
Hemoglobin: 7.3 g/dL — ABNORMAL LOW (ref 12.0–15.0)
Hemoglobin: 7.6 g/dL — ABNORMAL LOW (ref 12.0–15.0)
Hemoglobin: 7.9 g/dL — ABNORMAL LOW (ref 12.0–15.0)

## 2020-01-17 LAB — CBC
HCT: 24.3 % — ABNORMAL LOW (ref 36.0–46.0)
HCT: 21.5 % — ABNORMAL LOW (ref 36.0–46.0)
HCT: 25.5 % — ABNORMAL LOW (ref 36.0–46.0)
Hemoglobin: 7.9 g/dL — ABNORMAL LOW (ref 12.0–15.0)
Hemoglobin: 7 g/dL — ABNORMAL LOW (ref 12.0–15.0)
Hemoglobin: 8.3 g/dL — ABNORMAL LOW (ref 12.0–15.0)
MCH: 30 pg (ref 26.0–34.0)
MCH: 29.9 pg (ref 26.0–34.0)
MCH: 29.4 pg (ref 26.0–34.0)
MCHC: 32.5 g/dL (ref 30.0–36.0)
MCHC: 32.6 g/dL (ref 30.0–36.0)
MCHC: 32.5 g/dL (ref 30.0–36.0)
MCV: 92.4 fL (ref 80.0–100.0)
MCV: 91.9 fL (ref 80.0–100.0)
MCV: 90.4 fL (ref 80.0–100.0)
Platelets: 84 10*3/uL — ABNORMAL LOW (ref 150–400)
Platelets: 101 10*3/uL — ABNORMAL LOW (ref 150–400)
Platelets: 192 10*3/uL (ref 150–400)
RBC: 2.63 MIL/uL — ABNORMAL LOW (ref 3.87–5.11)
RBC: 2.34 MIL/uL — ABNORMAL LOW (ref 3.87–5.11)
RBC: 2.82 MIL/uL — ABNORMAL LOW (ref 3.87–5.11)
RDW: 14.3 % (ref 11.5–15.5)
RDW: 14.4 % (ref 11.5–15.5)
RDW: 14.1 % (ref 11.5–15.5)
WBC: 7.4 10*3/uL (ref 4.0–10.5)
WBC: 8.1 10*3/uL (ref 4.0–10.5)
WBC: 7 10*3/uL (ref 4.0–10.5)
nRBC: 0 % (ref 0.0–0.2)
nRBC: 0 % (ref 0.0–0.2)
nRBC: 0 % (ref 0.0–0.2)

## 2020-01-17 LAB — GLUCOSE, CAPILLARY
Glucose-Capillary: 104 mg/dL — ABNORMAL HIGH (ref 70–99)
Glucose-Capillary: 112 mg/dL — ABNORMAL HIGH (ref 70–99)
Glucose-Capillary: 114 mg/dL — ABNORMAL HIGH (ref 70–99)
Glucose-Capillary: 150 mg/dL — ABNORMAL HIGH (ref 70–99)

## 2020-01-17 LAB — PREPARE RBC (CROSSMATCH)

## 2020-01-17 LAB — PLATELET COUNT
Platelets: 109 10*3/uL — ABNORMAL LOW (ref 150–400)
Platelets: 85 10*3/uL — ABNORMAL LOW (ref 150–400)

## 2020-01-17 LAB — PROTIME-INR
INR: 1 (ref 0.8–1.2)
INR: 1.8 — ABNORMAL HIGH (ref 0.8–1.2)
INR: 1.9 — ABNORMAL HIGH (ref 0.8–1.2)
Prothrombin Time: 12.7 seconds (ref 11.4–15.2)
Prothrombin Time: 20.2 seconds — ABNORMAL HIGH (ref 11.4–15.2)
Prothrombin Time: 21.2 seconds — ABNORMAL HIGH (ref 11.4–15.2)

## 2020-01-17 LAB — BASIC METABOLIC PANEL
Anion gap: 9 (ref 5–15)
BUN: 10 mg/dL (ref 8–23)
CO2: 23 mmol/L (ref 22–32)
Calcium: 6.7 mg/dL — ABNORMAL LOW (ref 8.9–10.3)
Chloride: 111 mmol/L (ref 98–111)
Creatinine, Ser: 0.56 mg/dL (ref 0.44–1.00)
GFR calc Af Amer: >60 mL/min (ref >60)
GFR calc non Af Amer: >60 mL/min (ref >60)
Glucose, Bld: 173 mg/dL — ABNORMAL HIGH (ref 70–99)
Potassium: 3.9 mmol/L (ref 3.5–5.1)
Sodium: 143 mmol/L (ref 135–145)

## 2020-01-17 LAB — FIBRINOGEN
Fibrinogen: 166 mg/dL — ABNORMAL LOW (ref 210–475)
Fibrinogen: 185 mg/dL — ABNORMAL LOW (ref 210–475)

## 2020-01-17 LAB — BLOOD GAS, ARTERIAL
Acid-Base Excess: 3.2 mmol/L — ABNORMAL HIGH (ref 0.0–2.0)
Bicarbonate: 27.5 mmol/L (ref 20.0–28.0)
FIO2: 21
O2 Saturation: 98.2 %
Patient temperature: 37
pCO2 arterial: 44 mmHg (ref 32.0–48.0)
pH, Arterial: 7.413 (ref 7.350–7.450)
pO2, Arterial: 105 mmHg (ref 83.0–108.0)

## 2020-01-17 LAB — APTT
aPTT: 49 seconds — ABNORMAL HIGH (ref 24–36)
aPTT: 55 seconds — ABNORMAL HIGH (ref 24–36)

## 2020-01-17 LAB — MAGNESIUM: Magnesium: 3.1 mg/dL — ABNORMAL HIGH (ref 1.7–2.4)

## 2020-01-17 SURGERY — REPAIR, MITRAL VALVE, MINIMALLY INVASIVE
Anesthesia: General | Site: Chest | Laterality: Right

## 2020-01-17 MED ORDER — MIDAZOLAM HCL (PF) 10 MG/2ML IJ SOLN
INTRAMUSCULAR | Status: AC
  Filled 2020-01-17: qty 2

## 2020-01-17 MED ORDER — LACTATED RINGERS IV SOLN: INTRAVENOUS | Status: DC

## 2020-01-17 MED ORDER — MAGNESIUM SULFATE 4 GM/100ML IV SOLN
INTRAVENOUS | Status: AC
  Administered 2020-01-17: 4 g via INTRAVENOUS
  Filled 2020-01-17: qty 100

## 2020-01-17 MED ORDER — FENTANYL CITRATE (PF) 250 MCG/5ML IJ SOLN
INTRAMUSCULAR | Status: DC | PRN
  Administered 2020-01-17: 50 ug via INTRAVENOUS
  Administered 2020-01-17: 150 ug via INTRAVENOUS
  Administered 2020-01-17: 250 ug via INTRAVENOUS
  Administered 2020-01-17: 150 ug via INTRAVENOUS
  Administered 2020-01-17: 50 ug via INTRAVENOUS
  Administered 2020-01-17: 100 ug via INTRAVENOUS
  Administered 2020-01-17: 150 ug via INTRAVENOUS
  Administered 2020-01-17: 100 ug via INTRAVENOUS

## 2020-01-17 MED ORDER — BISACODYL 10 MG RE SUPP: 10.0000 mg | Freq: Every day | RECTAL | Status: DC

## 2020-01-17 MED ORDER — FENTANYL CITRATE (PF) 100 MCG/2ML IJ SOLN
50.0000 ug | INTRAMUSCULAR | Status: DC | PRN
  Administered 2020-01-18: 50 ug via INTRAVENOUS
  Administered 2020-01-18 (×6): 100 ug via INTRAVENOUS
  Administered 2020-01-19 (×2): 50 ug via INTRAVENOUS
  Administered 2020-01-19: 100 ug via INTRAVENOUS
  Administered 2020-01-20: 50 ug via INTRAVENOUS
  Administered 2020-01-22 – 2020-01-25 (×15): 100 ug via INTRAVENOUS
  Filled 2020-01-17 (×28): qty 2

## 2020-01-17 MED ORDER — MILRINONE LACTATE IN DEXTROSE 20-5 MG/100ML-% IV SOLN
0.0000 ug/kg/min | INTRAVENOUS | Status: DC
  Administered 2020-01-18: 0.25 ug/kg/min via INTRAVENOUS
  Administered 2020-01-18: 0.375 ug/kg/min via INTRAVENOUS
  Administered 2020-01-19: 0.25 ug/kg/min via INTRAVENOUS
  Filled 2020-01-17 (×3): qty 100

## 2020-01-17 MED ORDER — FAMOTIDINE IN NACL 20-0.9 MG/50ML-% IV SOLN
INTRAVENOUS | Status: AC
  Administered 2020-01-17: 20 mg via INTRAVENOUS
  Filled 2020-01-17: qty 50

## 2020-01-17 MED ORDER — CHLORHEXIDINE GLUCONATE 0.12 % MT SOLN
15.0000 mL | OROMUCOSAL | Status: AC
  Administered 2020-01-17: 15 mL via OROMUCOSAL

## 2020-01-17 MED ORDER — POTASSIUM CHLORIDE 10 MEQ/50ML IV SOLN
10.0000 meq | INTRAVENOUS | Status: AC
  Administered 2020-01-17 (×3): 10 meq via INTRAVENOUS

## 2020-01-17 MED ORDER — ONDANSETRON HCL 4 MG/2ML IJ SOLN
4.0000 mg | Freq: Four times a day (QID) | INTRAMUSCULAR | Status: DC | PRN
  Administered 2020-01-18 – 2020-01-27 (×6): 4 mg via INTRAVENOUS
  Filled 2020-01-17 (×7): qty 2

## 2020-01-17 MED ORDER — ALBUMIN HUMAN 5 % IV SOLN
INTRAVENOUS | Status: DC | PRN
Start: 2020-01-17 — End: 2020-01-17

## 2020-01-17 MED ORDER — BISACODYL 5 MG PO TBEC
10.0000 mg | DELAYED_RELEASE_TABLET | Freq: Every day | ORAL | Status: DC
  Administered 2020-01-18 – 2020-01-24 (×7): 10 mg via ORAL
  Filled 2020-01-17 (×7): qty 2

## 2020-01-17 MED ORDER — MAGNESIUM SULFATE 4 GM/100ML IV SOLN: 4.0000 g | Freq: Once | INTRAVENOUS | Status: AC

## 2020-01-17 MED ORDER — SODIUM CHLORIDE (PF) 0.9 % IJ SOLN
OROMUCOSAL | Status: DC | PRN
  Administered 2020-01-17 (×5): 4 mL via TOPICAL

## 2020-01-17 MED ORDER — HEPARIN SODIUM (PORCINE) 1000 UNIT/ML IJ SOLN
INTRAMUSCULAR | Status: DC | PRN
  Administered 2020-01-17: 20000 [IU] via INTRAVENOUS

## 2020-01-17 MED ORDER — CHLORHEXIDINE GLUCONATE 0.12 % MT SOLN
OROMUCOSAL | Status: AC
  Administered 2020-01-17: 15 mL via OROMUCOSAL
  Filled 2020-01-17: qty 15

## 2020-01-17 MED ORDER — EPHEDRINE 5 MG/ML INJ
INTRAVENOUS | Status: AC
  Filled 2020-01-17: qty 10

## 2020-01-17 MED ORDER — ACETAMINOPHEN 650 MG RE SUPP
650.0000 mg | Freq: Once | RECTAL | Status: AC
  Administered 2020-01-17: 650 mg via RECTAL

## 2020-01-17 MED ORDER — ACETAMINOPHEN 160 MG/5ML PO SOLN: 650.0000 mg | Freq: Once | ORAL | Status: AC

## 2020-01-17 MED ORDER — SODIUM CHLORIDE 0.9% FLUSH
10.0000 mL | Freq: Two times a day (BID) | INTRAVENOUS | Status: DC
  Administered 2020-01-17 – 2020-01-29 (×9): 10 mL

## 2020-01-17 MED ORDER — CALCIUM CHLORIDE 10 % IV SOLN
INTRAVENOUS | Status: DC | PRN
Start: 2020-01-17 — End: 2020-01-17
  Administered 2020-01-17 (×10): 100 mg via INTRAVENOUS

## 2020-01-17 MED ORDER — CHLORHEXIDINE GLUCONATE 4 % EX LIQD: 30.0000 mL | CUTANEOUS | Status: DC

## 2020-01-17 MED ORDER — INSULIN ASPART 100 UNIT/ML ~~LOC~~ SOLN
0.0000 [IU] | SUBCUTANEOUS | Status: DC
  Administered 2020-01-17 – 2020-01-18 (×2): 2 [IU] via SUBCUTANEOUS

## 2020-01-17 MED ORDER — NITROGLYCERIN IN D5W 200-5 MCG/ML-% IV SOLN: 0.0000 ug/min | INTRAVENOUS | Status: DC

## 2020-01-17 MED ORDER — METOPROLOL TARTRATE 5 MG/5ML IV SOLN
2.5000 mg | INTRAVENOUS | Status: DC | PRN
  Administered 2020-01-23 – 2020-01-28 (×5): 5 mg via INTRAVENOUS
  Filled 2020-01-17 (×8): qty 5

## 2020-01-17 MED ORDER — CHLORHEXIDINE GLUCONATE CLOTH 2 % EX PADS
6.0000 | MEDICATED_PAD | Freq: Every day | CUTANEOUS | Status: DC
  Administered 2020-01-18 – 2020-01-30 (×8): 6 via TOPICAL

## 2020-01-17 MED ORDER — METOPROLOL TARTRATE 12.5 MG HALF TABLET
ORAL_TABLET | ORAL | Status: AC
  Administered 2020-01-17: 12.5 mg via ORAL
  Filled 2020-01-17: qty 1

## 2020-01-17 MED ORDER — PROPOFOL 10 MG/ML IV BOLUS
INTRAVENOUS | Status: DC | PRN
  Administered 2020-01-17: 30 mg via INTRAVENOUS
  Administered 2020-01-17: 60 mg via INTRAVENOUS

## 2020-01-17 MED ORDER — VANCOMYCIN HCL 1000 MG IV SOLR
INTRAVENOUS | Status: DC | PRN
  Administered 2020-01-17: 1000 mL

## 2020-01-17 MED ORDER — ASPIRIN 81 MG PO CHEW: 324.0000 mg | CHEWABLE_TABLET | Freq: Every day | ORAL | Status: DC

## 2020-01-17 MED ORDER — PHENYLEPHRINE 40 MCG/ML (10ML) SYRINGE FOR IV PUSH (FOR BLOOD PRESSURE SUPPORT)
PREFILLED_SYRINGE | INTRAVENOUS | Status: AC
  Filled 2020-01-17: qty 10

## 2020-01-17 MED ORDER — FAMOTIDINE IN NACL 20-0.9 MG/50ML-% IV SOLN
20.0000 mg | Freq: Two times a day (BID) | INTRAVENOUS | Status: AC
  Administered 2020-01-17: 20 mg via INTRAVENOUS
  Filled 2020-01-17: qty 50

## 2020-01-17 MED ORDER — ACETAMINOPHEN 500 MG PO TABS
1000.0000 mg | ORAL_TABLET | Freq: Four times a day (QID) | ORAL | Status: AC
  Administered 2020-01-18 – 2020-01-22 (×20): 1000 mg via ORAL
  Filled 2020-01-17 (×19): qty 2

## 2020-01-17 MED ORDER — DOCUSATE SODIUM 100 MG PO CAPS
200.0000 mg | ORAL_CAPSULE | Freq: Every day | ORAL | Status: DC
  Administered 2020-01-18 – 2020-01-24 (×7): 200 mg via ORAL
  Filled 2020-01-17 (×7): qty 2

## 2020-01-17 MED ORDER — CHLORHEXIDINE GLUCONATE 0.12% ORAL RINSE (MEDLINE KIT)
15.0000 mL | Freq: Two times a day (BID) | OROMUCOSAL | Status: DC
  Administered 2020-01-17: 15 mL via OROMUCOSAL

## 2020-01-17 MED ORDER — ORAL CARE MOUTH RINSE
15.0000 mL | OROMUCOSAL | Status: DC
  Administered 2020-01-17 (×2): 15 mL via OROMUCOSAL

## 2020-01-17 MED ORDER — ROCURONIUM BROMIDE 10 MG/ML (PF) SYRINGE
PREFILLED_SYRINGE | INTRAVENOUS | Status: DC | PRN
  Administered 2020-01-17: 20 mg via INTRAVENOUS
  Administered 2020-01-17: 80 mg via INTRAVENOUS
  Administered 2020-01-17 (×2): 50 mg via INTRAVENOUS

## 2020-01-17 MED ORDER — PHENYLEPHRINE HCL (PRESSORS) 10 MG/ML IV SOLN
INTRAVENOUS | Status: DC | PRN
Start: 2020-01-17 — End: 2020-01-17
  Administered 2020-01-17: 80 ug via INTRAVENOUS
  Administered 2020-01-17: 120 ug via INTRAVENOUS
  Administered 2020-01-17: 40 ug via INTRAVENOUS
  Administered 2020-01-17: 120 ug via INTRAVENOUS
  Administered 2020-01-17: 20 ug via INTRAVENOUS

## 2020-01-17 MED ORDER — SODIUM CHLORIDE 0.9% IV SOLUTION: Freq: Once | INTRAVENOUS | Status: AC

## 2020-01-17 MED ORDER — ARTIFICIAL TEARS OPHTHALMIC OINT
TOPICAL_OINTMENT | OPHTHALMIC | Status: DC | PRN
  Administered 2020-01-17: 1 via OPHTHALMIC

## 2020-01-17 MED ORDER — SODIUM CHLORIDE 0.45 % IV SOLN: INTRAVENOUS | Status: DC | PRN

## 2020-01-17 MED ORDER — VANCOMYCIN HCL IN DEXTROSE 1-5 GM/200ML-% IV SOLN
1000.0000 mg | Freq: Once | INTRAVENOUS | Status: AC
  Administered 2020-01-17: 1000 mg via INTRAVENOUS
  Filled 2020-01-17: qty 200

## 2020-01-17 MED ORDER — SODIUM CHLORIDE 0.9 % IV SOLN: INTRAVENOUS | Status: DC

## 2020-01-17 MED ORDER — PANTOPRAZOLE SODIUM 40 MG PO TBEC
40.0000 mg | DELAYED_RELEASE_TABLET | Freq: Every day | ORAL | Status: DC
  Administered 2020-01-19 – 2020-02-02 (×15): 40 mg via ORAL
  Filled 2020-01-17 (×15): qty 1

## 2020-01-17 MED ORDER — PHENYLEPHRINE HCL-NACL 20-0.9 MG/250ML-% IV SOLN
0.0000 ug/min | INTRAVENOUS | Status: DC
  Administered 2020-01-17 – 2020-01-18 (×2): 45 ug/min via INTRAVENOUS
  Administered 2020-01-18: 55 ug/min via INTRAVENOUS
  Administered 2020-01-18: 45 ug/min via INTRAVENOUS
  Administered 2020-01-19: 30 ug/min via INTRAVENOUS
  Filled 2020-01-17 (×5): qty 250

## 2020-01-17 MED ORDER — SODIUM CHLORIDE 0.9 % IV SOLN: INTRAVENOUS | Status: DC | PRN

## 2020-01-17 MED ORDER — SODIUM CHLORIDE 0.9 % IR SOLN
Status: DC | PRN
  Administered 2020-01-17 (×2): 3000 mL

## 2020-01-17 MED ORDER — LIDOCAINE 2% (20 MG/ML) 5 ML SYRINGE
INTRAMUSCULAR | Status: DC | PRN
  Administered 2020-01-17: 60 mg via INTRAVENOUS

## 2020-01-17 MED ORDER — MIDAZOLAM HCL 2 MG/2ML IJ SOLN: 2.0000 mg | INTRAMUSCULAR | Status: DC | PRN

## 2020-01-17 MED ORDER — PROPOFOL 10 MG/ML IV BOLUS
INTRAVENOUS | Status: AC
  Filled 2020-01-17: qty 20

## 2020-01-17 MED ORDER — INSULIN REGULAR(HUMAN) IN NACL 100-0.9 UT/100ML-% IV SOLN: INTRAVENOUS | Status: DC

## 2020-01-17 MED ORDER — SODIUM CHLORIDE 0.9 % IV SOLN
INTRAVENOUS | Status: DC | PRN
  Administered 2020-01-17 (×2): 1000 mL

## 2020-01-17 MED ORDER — CHLORHEXIDINE GLUCONATE 0.12 % MT SOLN: 15.0000 mL | Freq: Once | OROMUCOSAL | Status: AC

## 2020-01-17 MED ORDER — SODIUM CHLORIDE 0.9% FLUSH
3.0000 mL | Freq: Two times a day (BID) | INTRAVENOUS | Status: DC
  Administered 2020-01-18 – 2020-02-02 (×25): 3 mL via INTRAVENOUS

## 2020-01-17 MED ORDER — METOPROLOL TARTRATE 12.5 MG HALF TABLET: 12.5000 mg | ORAL_TABLET | Freq: Once | ORAL | Status: AC

## 2020-01-17 MED ORDER — BUPIVACAINE LIPOSOME 1.3 % IJ SUSP
INTRAMUSCULAR | Status: DC | PRN
  Administered 2020-01-17: 50 mL

## 2020-01-17 MED ORDER — SODIUM CHLORIDE 0.9% FLUSH
3.0000 mL | INTRAVENOUS | Status: DC | PRN
  Administered 2020-01-23 – 2020-02-01 (×3): 3 mL via INTRAVENOUS

## 2020-01-17 MED ORDER — FENTANYL CITRATE (PF) 250 MCG/5ML IJ SOLN
INTRAMUSCULAR | Status: AC
  Filled 2020-01-17: qty 25

## 2020-01-17 MED ORDER — SODIUM CHLORIDE 0.9% FLUSH
10.0000 mL | INTRAVENOUS | Status: DC | PRN
  Administered 2020-01-19: 10 mL

## 2020-01-17 MED ORDER — SODIUM CHLORIDE 0.9 % IV SOLN
1.5000 g | Freq: Two times a day (BID) | INTRAVENOUS | Status: AC
  Administered 2020-01-17 – 2020-01-19 (×4): 1.5 g via INTRAVENOUS
  Filled 2020-01-17 (×4): qty 1.5

## 2020-01-17 MED ORDER — ALBUMIN HUMAN 5 % IV SOLN
250.0000 mL | INTRAVENOUS | Status: AC | PRN
  Administered 2020-01-17 – 2020-01-18 (×4): 12.5 g via INTRAVENOUS
  Filled 2020-01-17: qty 250

## 2020-01-17 MED ORDER — BUPIVACAINE HCL (PF) 0.5 % IJ SOLN
INTRAMUSCULAR | Status: AC
  Filled 2020-01-17: qty 30

## 2020-01-17 MED ORDER — LIDOCAINE 2% (20 MG/ML) 5 ML SYRINGE
INTRAMUSCULAR | Status: AC
  Filled 2020-01-17: qty 5

## 2020-01-17 MED ORDER — ARTIFICIAL TEARS OPHTHALMIC OINT
TOPICAL_OINTMENT | OPHTHALMIC | Status: AC
  Filled 2020-01-17: qty 3.5

## 2020-01-17 MED ORDER — ROCURONIUM BROMIDE 10 MG/ML (PF) SYRINGE
PREFILLED_SYRINGE | INTRAVENOUS | Status: AC
  Filled 2020-01-17: qty 10

## 2020-01-17 MED ORDER — LACTATED RINGERS IV SOLN
INTRAVENOUS | Status: DC | PRN
Start: 2020-01-17 — End: 2020-01-17

## 2020-01-17 MED ORDER — ACETAMINOPHEN 160 MG/5ML PO SOLN: 1000.0000 mg | Freq: Four times a day (QID) | ORAL | Status: DC

## 2020-01-17 MED ORDER — DEXMEDETOMIDINE HCL IN NACL 400 MCG/100ML IV SOLN
0.0000 ug/kg/h | INTRAVENOUS | Status: DC
  Administered 2020-01-17: 0.6 ug/kg/h via INTRAVENOUS
  Administered 2020-01-19: 0.5 ug/kg/h via INTRAVENOUS
  Filled 2020-01-17 (×2): qty 100

## 2020-01-17 MED ORDER — SODIUM CHLORIDE 0.9 % IV SOLN
250.0000 mL | INTRAVENOUS | Status: DC
  Administered 2020-01-25: 250 mL via INTRAVENOUS

## 2020-01-17 MED ORDER — 0.9 % SODIUM CHLORIDE (POUR BTL) OPTIME
TOPICAL | Status: DC | PRN
  Administered 2020-01-17: 4000 mL

## 2020-01-17 MED ORDER — DEXTROSE 50 % IV SOLN: 0.0000 mL | INTRAVENOUS | Status: DC | PRN

## 2020-01-17 MED ORDER — LACTATED RINGERS IV SOLN: 500.0000 mL | Freq: Once | INTRAVENOUS | Status: DC | PRN

## 2020-01-17 MED ORDER — MIDAZOLAM HCL 5 MG/5ML IJ SOLN
INTRAMUSCULAR | Status: DC | PRN
  Administered 2020-01-17 (×2): 2 mg via INTRAVENOUS
  Administered 2020-01-17 (×2): 1 mg via INTRAVENOUS

## 2020-01-17 MED ORDER — PLASMA-LYTE 148 IV SOLN
INTRAVENOUS | Status: DC | PRN
  Administered 2020-01-17: 500 mL via INTRAVASCULAR

## 2020-01-17 MED ORDER — ASPIRIN EC 325 MG PO TBEC
325.0000 mg | DELAYED_RELEASE_TABLET | Freq: Every day | ORAL | Status: DC
  Administered 2020-01-18: 325 mg via ORAL
  Filled 2020-01-17: qty 1

## 2020-01-17 MED ORDER — PROTAMINE SULFATE 10 MG/ML IV SOLN
INTRAVENOUS | Status: DC | PRN
  Administered 2020-01-17: 200 mg via INTRAVENOUS

## 2020-01-17 SURGICAL SUPPLY — 144 items
ADAPTER CARDIO PERF ANTE/RETRO (ADAPTER) ×3 IMPLANT
ADH SKN CLS APL DERMABOND .7 (GAUZE/BANDAGES/DRESSINGS) ×2
ADPR PRFSN 84XANTGRD RTRGD (ADAPTER) ×2
ARTICLIP LAA PROCLIP II 45 (Clip) ×3 IMPLANT
BAG DECANTER FOR FLEXI CONT (MISCELLANEOUS) ×6 IMPLANT
BLADE CLIPPER SURG (BLADE) ×2 IMPLANT
BLADE STERNUM SYSTEM 6 (BLADE) ×1 IMPLANT
BLADE SURG 11 STRL SS (BLADE) ×3 IMPLANT
CANISTER SUCT 3000ML PPV (MISCELLANEOUS) ×5 IMPLANT
CANNULA ADULT BIO-MEDICUS 15FR (CANNULA) ×1 IMPLANT
CANNULA AORTIC ROOT 9FR (CANNULA) ×1 IMPLANT
CANNULA FEM VENOUS REMOTE 22FR (CANNULA) ×1 IMPLANT
CANNULA FEMORAL ART 14 SM (MISCELLANEOUS) ×3 IMPLANT
CANNULA GUNDRY RCSP 15FR (MISCELLANEOUS) ×3 IMPLANT
CANNULA OPTISITE PERFUSION 16F (CANNULA) ×1 IMPLANT
CANNULA OPTISITE PERFUSION 18F (CANNULA) IMPLANT
CANNULA SUMP PERICARDIAL (CANNULA) ×6 IMPLANT
CARDIOBLATE CARDIAC ABLATION (MISCELLANEOUS)
CATH CPB KIT OWEN (MISCELLANEOUS) IMPLANT
CATH KIT ON-Q SILVERSOAK 5 (CATHETERS) IMPLANT
CATH KIT ON-Q SILVERSOAK 5IN (CATHETERS) IMPLANT
CATH THORACIC 36FR (CATHETERS) ×1 IMPLANT
CELLS DAT CNTRL 66122 CELL SVR (MISCELLANEOUS) ×2 IMPLANT
CLAMP OLL ABLATION (MISCELLANEOUS) ×1 IMPLANT
CNTNR URN SCR LID CUP LEK RST (MISCELLANEOUS) ×2 IMPLANT
CONN ST 1/4X3/8  BEN (MISCELLANEOUS) ×9
CONN ST 1/4X3/8 BEN (MISCELLANEOUS) ×4 IMPLANT
CONNECTOR 1/2X3/8X1/2 3 WAY (MISCELLANEOUS) ×3
CONNECTOR 1/2X3/8X1/2 3WAY (MISCELLANEOUS) ×2 IMPLANT
CONT SPEC 4OZ STRL OR WHT (MISCELLANEOUS)
COVER BACK TABLE 24X17X13 BIG (DRAPES) ×3 IMPLANT
COVER PROBE W GEL 5X96 (DRAPES) ×3 IMPLANT
DERMABOND ADVANCED (GAUZE/BANDAGES/DRESSINGS) ×1
DERMABOND ADVANCED .7 DNX12 (GAUZE/BANDAGES/DRESSINGS) ×4 IMPLANT
DEVICE ATRICLIP LAA PRCLPII 45 (Clip) ×2 IMPLANT
DEVICE CARDIOBLATE CARDIAC ABL (MISCELLANEOUS) IMPLANT
DEVICE CLOSURE PERCLS PRGLD 6F (VASCULAR PRODUCTS) ×8 IMPLANT
DEVICE SUT CK QUICK LOAD INDV (Prosthesis & Implant Heart) ×3 IMPLANT
DEVICE SUT CK QUICK LOAD MINI (Prosthesis & Implant Heart) ×1 IMPLANT
DEVICE TROCAR PUNCTURE CLOSURE (ENDOMECHANICALS) ×3 IMPLANT
DRAIN CHANNEL 28F RND 3/8 FF (WOUND CARE) ×2 IMPLANT
DRAIN CHANNEL 32F RND 10.7 FF (WOUND CARE) ×6 IMPLANT
DRAPE C-ARM 42X72 X-RAY (DRAPES) ×3 IMPLANT
DRAPE CV SPLIT W-CLR ANES SCRN (DRAPES) ×3 IMPLANT
DRAPE INCISE IOBAN 66X45 STRL (DRAPES) ×9 IMPLANT
DRAPE PERI GROIN 82X75IN TIB (DRAPES) ×3 IMPLANT
DRAPE SLUSH/WARMER DISC (DRAPES) ×3 IMPLANT
DRSG AQUACEL AG ADV 3.5X 6 (GAUZE/BANDAGES/DRESSINGS) ×1 IMPLANT
DRSG AQUACEL AG ADV 3.5X14 (GAUZE/BANDAGES/DRESSINGS) ×1 IMPLANT
DRSG COVADERM 4X8 (GAUZE/BANDAGES/DRESSINGS) ×3 IMPLANT
ELECT BLADE 6.5 EXT (BLADE) ×3 IMPLANT
ELECT REM PT RETURN 9FT ADLT (ELECTROSURGICAL) ×6
ELECTRODE REM PT RTRN 9FT ADLT (ELECTROSURGICAL) ×4 IMPLANT
FELT TEFLON 1X6 (MISCELLANEOUS) ×4 IMPLANT
FEMORAL VENOUS CANN RAP (CANNULA) IMPLANT
FIBERTAPE STERNAL CLSR 2 36IN (SUTURE) ×3 IMPLANT
FIBERTAPE STERNAL CLSR 2X36 (SUTURE) ×4 IMPLANT
GAUZE SPONGE 4X4 12PLY STRL (GAUZE/BANDAGES/DRESSINGS) ×3 IMPLANT
GAUZE SPONGE 4X4 12PLY STRL LF (GAUZE/BANDAGES/DRESSINGS) ×3 IMPLANT
GLOVE BIOGEL PI IND STRL 6 (GLOVE) IMPLANT
GLOVE BIOGEL PI IND STRL 6.5 (GLOVE) IMPLANT
GLOVE BIOGEL PI INDICATOR 6 (GLOVE) ×2
GLOVE BIOGEL PI INDICATOR 6.5 (GLOVE) ×2
GLOVE ORTHO TXT STRL SZ7.5 (GLOVE) ×9 IMPLANT
GLOVE SURG SS PI 6.0 STRL IVOR (GLOVE) ×3 IMPLANT
GLOVE SURG SS PI 6.5 STRL IVOR (GLOVE) ×4 IMPLANT
GLOVE SURG SS PI 7.5 STRL IVOR (GLOVE) ×3 IMPLANT
GOWN STRL REUS W/ TWL LRG LVL3 (GOWN DISPOSABLE) ×8 IMPLANT
GOWN STRL REUS W/TWL LRG LVL3 (GOWN DISPOSABLE) ×30
GRASPER SUT TROCAR 14GX15 (MISCELLANEOUS) ×3 IMPLANT
HEMOSTAT POWDER SURGIFOAM 1G (HEMOSTASIS) ×5 IMPLANT
INSERT FOGARTY XLG (MISCELLANEOUS) ×1 IMPLANT
IV NS 1000ML (IV SOLUTION) ×6
IV NS 1000ML BAXH (IV SOLUTION) IMPLANT
IV NS IRRIG 3000ML ARTHROMATIC (IV SOLUTION) ×2 IMPLANT
KIT BASIN OR (CUSTOM PROCEDURE TRAY) ×3 IMPLANT
KIT DILATOR VASC 18G NDL (KITS) ×3 IMPLANT
KIT DRAINAGE VACCUM ASSIST (KITS) ×1 IMPLANT
KIT SUCTION CATH 14FR (SUCTIONS) ×3 IMPLANT
KIT SUT CK MINI COMBO 4X17 (Prosthesis & Implant Heart) ×1 IMPLANT
KIT TURNOVER KIT B (KITS) ×3 IMPLANT
LEAD PACING MYOCARDI (MISCELLANEOUS) ×3 IMPLANT
LINE VENT (MISCELLANEOUS) ×1 IMPLANT
NDL AORTIC ROOT 14G 7F (CATHETERS) ×2 IMPLANT
NDL SUT PASSING CERCLAG MED (SUTURE) IMPLANT
NDL SUT PASSING CERCLAGE MED (SUTURE) ×3
NEEDLE AORTIC ROOT 14G 7F (CATHETERS) ×3 IMPLANT
NEEDLE SUT PASSING CERCLAG MED (SUTURE) ×2 IMPLANT
NS IRRIG 1000ML POUR BTL (IV SOLUTION) ×14 IMPLANT
PACK E MIN INVASIVE VALVE (SUTURE) ×3 IMPLANT
PACK OPEN HEART (CUSTOM PROCEDURE TRAY) ×3 IMPLANT
PAD ARMBOARD 7.5X6 YLW CONV (MISCELLANEOUS) ×6 IMPLANT
PAD ELECT DEFIB RADIOL ZOLL (MISCELLANEOUS) ×3 IMPLANT
PERCLOSE PROGLIDE 6F (VASCULAR PRODUCTS) ×12
POSITIONER HEAD DONUT 9IN (MISCELLANEOUS) ×3 IMPLANT
PROBE CRYO2-ABLATION MALLABLE (MISCELLANEOUS) ×1 IMPLANT
RETRACTOR WND ALEXIS 18 MED (MISCELLANEOUS) ×2 IMPLANT
RING MITRAL MEMO 4D 32 (Prosthesis & Implant Heart) ×1 IMPLANT
RTRCTR WOUND ALEXIS 18CM MED (MISCELLANEOUS) ×3
SET CANNULATION TOURNIQUET (MISCELLANEOUS) ×3 IMPLANT
SET CARDIOPLEGIA MPS 5001102 (MISCELLANEOUS) ×1 IMPLANT
SET IRRIG TUBING LAPAROSCOPIC (IRRIGATION / IRRIGATOR) ×3 IMPLANT
SET MICROPUNCTURE 5F STIFF (MISCELLANEOUS) ×3 IMPLANT
SHEATH PINNACLE 8F 10CM (SHEATH) ×9 IMPLANT
SIZER CHORD-X CHORDAL CXCS (SIZER) ×1 IMPLANT
SOL ANTI FOG 6CC (MISCELLANEOUS) ×2 IMPLANT
SOLUTION ANTI FOG 6CC (MISCELLANEOUS) ×1
SPONGE LAP 18X18 RF (DISPOSABLE) ×3 IMPLANT
SUT BONE WAX W31G (SUTURE) ×3 IMPLANT
SUT ETHIBOND (SUTURE) ×2 IMPLANT
SUT ETHIBOND 2 0 SH (SUTURE) ×1 IMPLANT
SUT ETHIBOND 2-0 RB-1 WHT (SUTURE) ×2 IMPLANT
SUT ETHIBOND NAB MH 2-0 36IN (SUTURE) ×12 IMPLANT
SUT ETHIBOND X763 2 0 SH 1 (SUTURE) ×3 IMPLANT
SUT GORETEX CV 4 TH 22 36 (SUTURE) IMPLANT
SUT GORETEX CV4 TH-18 (SUTURE) IMPLANT
SUT MNCRL AB 3-0 PS2 18 (SUTURE) ×2 IMPLANT
SUT PDS AB 1 CTX 36 (SUTURE) ×2 IMPLANT
SUT PROLENE 3 0 SH DA (SUTURE) ×1 IMPLANT
SUT PROLENE 3 0 SH1 36 (SUTURE) ×14 IMPLANT
SUT PROLENE 4 0 RB 1 (SUTURE) ×15
SUT PROLENE 4 0 SH DA (SUTURE) ×2 IMPLANT
SUT PROLENE 4-0 RB1 .5 CRCL 36 (SUTURE) IMPLANT
SUT PTFE CHORD X 20MM (SUTURE) ×1 IMPLANT
SUT PTFE CHORD X 24MM (SUTURE) ×2 IMPLANT
SUT SILK  1 MH (SUTURE) ×6
SUT SILK 1 MH (SUTURE) IMPLANT
SUT VIC AB 2-0 CTX 27 (SUTURE) ×2 IMPLANT
SYSTEM SAHARA CHEST DRAIN ATS (WOUND CARE) ×6 IMPLANT
TAPE PAPER 3X10 WHT MICROPORE (GAUZE/BANDAGES/DRESSINGS) ×1 IMPLANT
TOWEL GREEN STERILE (TOWEL DISPOSABLE) ×3 IMPLANT
TOWEL GREEN STERILE FF (TOWEL DISPOSABLE) ×3 IMPLANT
TRAY FOL W/BAG SLVR 16FR STRL (SET/KITS/TRAYS/PACK) IMPLANT
TRAY FOLEY SLVR 16FR TEMP STAT (SET/KITS/TRAYS/PACK) ×2 IMPLANT
TRAY FOLEY W/BAG SLVR 16FR LF (SET/KITS/TRAYS/PACK) ×3
TROCAR XCEL BLADELESS 5X75MML (TROCAR) ×3 IMPLANT
TROCAR XCEL NON-BLD 11X100MML (ENDOMECHANICALS) ×6 IMPLANT
TUBE CONNECTING 20X1/4 (TUBING) ×1 IMPLANT
TUBE SUCT INTRACARD DLP 20F (MISCELLANEOUS) ×3 IMPLANT
TUNNELER SHEATH ON-Q 11GX8 DSP (PAIN MANAGEMENT) IMPLANT
UNDERPAD 30X36 HEAVY ABSORB (UNDERPADS AND DIAPERS) ×3 IMPLANT
WATER STERILE IRR 1000ML POUR (IV SOLUTION) ×6 IMPLANT
WIRE EMERALD 3MM-J .035X150CM (WIRE) ×3 IMPLANT
YANKAUER SUCT BULB TIP NO VENT (SUCTIONS) ×1 IMPLANT

## 2020-01-17 NOTE — Anesthesia Procedure Notes (Signed)
Procedure Name: Intubation Date/Time: 01/17/2020 8:10 AM Performed by: Lance Coon, CRNA Pre-anesthesia Checklist: Patient identified, Emergency Drugs available, Suction available, Patient being monitored and Timeout performed Patient Re-evaluated:Patient Re-evaluated prior to induction Oxygen Delivery Method: Circle system utilized Preoxygenation: Pre-oxygenation with 100% oxygen Induction Type: IV induction Ventilation: Mask ventilation without difficulty Laryngoscope Size: Glidescope and 3 Grade View: Grade I Endobronchial tube: Left, Double lumen EBT, EBT position confirmed by auscultation and EBT position confirmed by fiberoptic bronchoscope and 35 Fr Number of attempts: 1 Airway Equipment and Method: Video-laryngoscopy Placement Confirmation: ETT inserted through vocal cords under direct vision,  positive ETCO2 and breath sounds checked- equal and bilateral Tube secured with: Tape Dental Injury: Teeth and Oropharynx as per pre-operative assessment

## 2020-01-17 NOTE — Brief Op Note (Signed)
01/17/2020  1:16 PM  PATIENT:  [name]  74 y.o. female  PRE-OPERATIVE DIAGNOSIS:  MITRAL REGURGITATION ATRIAL FIBRILLATION  POST-OPERATIVE DIAGNOSIS:  MITRAL REGURGITATION ATRIAL FIBRILLATION  PROCEDURE:  Procedure(s):  MINIMALLY INVASIVE MITRAL VALVE REPAIR  -Ring Annuloplasty with a 32 mm Sorin Memo 4D Ring -Placement of Neo Chords via Chord X system x 6 pairs  MINIMALLY INVASIVE MAZE PROCEDURE  -Complete Bi-Atrial Lesion set with Radiofrequency Ablation and Cryothermy -Clipping of LA Appendage  MEDIAN STERNOTOMY - Repair of Left Ventricle Free Wall  TRANSESOPHAGEAL ECHOCARDIOGRAM (TEE) (N/A)  SURGEON:  Surgeon(s) and Role:    Rexene Alberts, MD - Primary  PHYSICIAN ASSISTANT: Erin Barrett PA-C  ANESTHESIA:   general  EBL:  550 mL  BLOOD ADMINISTERED: PRBC and CELLSAVER  DRAINS: Right pleural chest tubes   LOCAL MEDICATIONS USED:  NONE  SPECIMEN:  No Specimen  DISPOSITION OF SPECIMEN:  N/A  COUNTS:  YES  TOURNIQUET:  * No tourniquets in log *  DICTATION: .Dragon Dictation  PLAN OF CARE: Admit to inpatient   PATIENT DISPOSITION:  ICU - intubated and hemodynamically stable.   Delay start of Pharmacological VTE agent (>24hrs) due to surgical blood loss or risk of bleeding: yes

## 2020-01-17 NOTE — Interval H&P Note (Signed)
History and Physical Interval Note:  01/17/2020 5:39 AM  [name]  has presented today for surgery, with the diagnosis of MR AFIB.  The various methods of treatment have been discussed with the patient and family. After consideration of risks, benefits and other options for treatment, the patient has consented to  Procedure(s): MINIMALLY INVASIVE MITRAL VALVE REPAIR (MVR) (Right) MINIMALLY INVASIVE MAZE PROCEDURE (N/A) TRANSESOPHAGEAL ECHOCARDIOGRAM (TEE) (N/A) as a surgical intervention.  The patient's history has been reviewed, patient examined, no change in status, stable for surgery.  I have reviewed the patient's chart and labs.  Questions were answered to the patient's satisfaction.     Rexene Alberts

## 2020-01-17 NOTE — Anesthesia Procedure Notes (Signed)
Central Venous Catheter Insertion Performed by: Roberts Gaudy, MD, anesthesiologist Start/End7/13/2021 7:00 AM, 01/17/2020 7:10 AM Patient location: Pre-op. Preanesthetic checklist: patient identified, IV checked, site marked, risks and benefits discussed, surgical consent, monitors and equipment checked, pre-op evaluation, timeout performed and anesthesia consent Lidocaine 1% used for infiltration and patient sedated Hand hygiene performed  and maximum sterile barriers used  Catheter size: 9 Fr Sheath introducer Procedure performed using ultrasound guided technique. Ultrasound Notes:anatomy identified, needle tip was noted to be adjacent to the nerve/plexus identified, no ultrasound evidence of intravascular and/or intraneural injection and image(s) printed for medical record Attempts: 1 Following insertion, line sutured and dressing applied. Post procedure assessment: blood return through all ports, free fluid flow and no air  Patient tolerated the procedure well with no immediate complications.

## 2020-01-17 NOTE — OR Nursing (Signed)
Dr. Roxy Manns notified of pt's latex allergy.  Dr. Roxy Manns stated he would wear latex gloves for the surgery.

## 2020-01-17 NOTE — Transfer of Care (Signed)
Immediate Anesthesia Transfer of Care Note  Patient: [name]  Procedure(s) Performed: MINIMALLY INVASIVE MITRAL VALVE REPAIR (MVR) USING MEMO 4D 32MM (Right Chest) MINIMALLY INVASIVE MAZE PROCEDURE (N/A ) TRANSESOPHAGEAL ECHOCARDIOGRAM (TEE) (N/A )  Patient Location: SICU  Anesthesia Type:General  Level of Consciousness: patient cooperative and Patient remains intubated per anesthesia plan  Airway & Oxygen Therapy: Patient remains intubated per anesthesia plan and Patient placed on Ventilator (see vital sign flow sheet for setting)  Post-op Assessment: Report given to RN and Post -op Vital signs reviewed and stable  Post vital signs: Reviewed and stable  Last Vitals:  Vitals Value Taken Time  BP    Temp    Pulse    Resp    SpO2      Last Pain:  Vitals:   01/17/20 0616  PainSc: 6       Patients Stated Pain Goal: 4 (97/53/00 5110)  Complications: No complications documented.

## 2020-01-17 NOTE — Progress Notes (Signed)
[facility] BRIEF SICU PROGRESS NOTE  Day of Surgery  S/P Procedure(s) (LRB): MINIMALLY INVASIVE MITRAL VALVE REPAIR (MVR) USING MEMO 4D 32MM (Right) MINIMALLY INVASIVE MAZE PROCEDURE (N/A) TRANSESOPHAGEAL ECHOCARDIOGRAM (TEE) (N/A)   Sedated on vent Sinus brady - AAI paced w/ stable hemodynamics on Milrinone 0.375 PA pressures low Chest tube output trending down but still 200 mL last hour Hgb 7.0 Platelet count 101k INR up 1.9 aPTT 55  Plan: Transfuse PRBCs for acute blood loss anemia.  Transfuse platelets, FFP and cryo for coagulopathy.  Rexene Alberts, MD 01/17/2020 7:07 PM

## 2020-01-17 NOTE — Anesthesia Procedure Notes (Signed)
Arterial Line Insertion Start/End7/13/2021 6:40 AM, 01/17/2020 7:00 AM Performed by: Lance Coon, CRNA, CRNA  Preanesthetic checklist: patient identified, IV checked, site marked, risks and benefits discussed, surgical consent, monitors and equipment checked, pre-op evaluation, timeout performed and anesthesia consent Lidocaine 1% used for infiltration Left, radial was placed Catheter size: 20 G Hand hygiene performed , maximum sterile barriers used  and Seldinger technique used  Attempts: 2 Procedure performed without using ultrasound guided technique. Following insertion, dressing applied and Biopatch. Post procedure assessment: normal and unchanged  Patient tolerated the procedure well with no immediate complications.

## 2020-01-17 NOTE — Op Note (Signed)
CARDIOTHORACIC SURGERY OPERATIVE NOTE  Date of Procedure:   01/17/2020  Preoperative Diagnosis:    Severe Mitral Regurgitation  Recurrent Persistent Atrial Fibrillation  Postoperative Diagnosis: Same  Procedure:    Minimally-invasive Mitral Valve Repair  Complex valvuloplasty including artificial Gore-tex neochord replacement x 12  Sorin Memo 4D Ring Annuloplasty (size 27mm, catalog #[id number], serial [id number])  Median sternotomy   Minimally-invasive Maze Procedure  Complete bilateral atrial lesion set using cryothermy and bipolar radiofrequency ablation  Clipping of Left Atrial Appendage (Atricure left atrial clip, size 45 mm)   Median Sternotomy for Repair of Left Ventricular Free-wall Laceration   Surgeon: Valentina Gu. Roxy Manns, MD  Assistant: Ellwood Handler, PA-C  Anesthesia: Laurie Panda, MD  Operative Findings:  Fibroelastic deficiency type myxomatous degenerative disease  Multiple ruptured primary chordae tendinae to A2 and A3 segment of anterior leaflet  Type II dysfunction severe mitral regurgitation  Normal left ventricular systolic function  No residual mitral regurgitation after successful valve repair  Laceration of anterolateral free-wall of left ventricle requiring median sternotomy for direct repair                    BRIEF CLINICAL NOTE AND INDICATIONS FOR SURGERY  Patient is a 74 year old female with history of mitral regurgitation, recurrent paroxysmal atrial fibrillation on long-term anticoagulation, severe scoliosis for which she has undergone numerous surgical procedures, chronic pain on long-term oral narcotics, anxiety, osteoporosis, degenerative arthritis, GE reflux disease, and peripheral neuropathy who has been referred for surgical consultation to discuss treatment options for management of mitral valve prolapse with severe symptomatic primary mitral regurgitation.  Patient states that she has known of presence of a loud heart  murmur for more than 15 years. For many years she was followed by Dr. Wynonia Lawman. Approximately 5 or 6 years ago she first began to develop symptomatic paroxysmal atrial fibrillation. She has been on long-term anticoagulation using Xarelto. For the last few years she has been followed in the atrial fibrillation clinic. She has been seen as an outpatient by Dr. Gwenlyn Found, Dr. Lovena Le, and more recently [name]. Approximately 2 years ago she was started on Tikosyn. She continues to have recurrent palpitations and episodes of paroxysmal atrial fibrillation although she has never required DC cardioversion. She has a long history of exertional shortness of breath and fatigue. Symptoms of shortness of breath have gotten worse and she now reports frequent episodes of feeling as though she is "smothering". Several months ago she was started on oral Lasix for lower extremity edema. Transthoracic echocardiogram performed July 25, 2019 revealed normal left ventricular systolic function with mitral valve prolapse and at least moderate mitral regurgitation. She underwent TEE Nov 21, 2019 which confirmed the presence of mitral valve prolapse with a large flail segment felt to be the A3 portion of the anterior leaflet with severe mitral regurgitation. Urgent cardiothoracic surgical consultation was requested.  The patient has been seen in consultation and counseled at length regarding the indications, risks and potential benefits of surgery.  All questions have been answered, and the patient provides full informed consent for the operation as described.    DETAILS OF THE OPERATIVE PROCEDURE  Preparation:  The patient is brought to the operating room on the above mentioned date and central monitoring was established by the anesthesia team including placement of Swan-Ganz catheter through the left internal jugular vein.  A radial arterial line is placed. The patient is placed in the supine position on the  operating table.  Intravenous antibiotics are administered.  General endotracheal anesthesia is induced uneventfully. The patient is initially intubated using a dual lumen endotracheal tube.  A Foley catheter is placed.  Baseline transesophageal echocardiogram was performed.  Findings were notable for severe mitral valve prolapse with an obvious flail segment involving the A3 segment of the anterior leaflet.  There are clear ruptured primary chordae tendinae.  There is severe mitral regurgitation with flow reversal in the pulmonary veins.  Left ventricular function appears normal in the setting of severe mitral regurgitation.  There is mild central aortic insufficiency.  The right ventricle is mildly dilated.  There is moderate tricuspid regurgitation.  The patient is carefully positioned on the operating table in the supine position with arms at her side.  Padding is utilized to support her head and neck.   The patient's right neck, chest, abdomen, both groins, and both lower extremities are prepared and draped in a sterile manner. A time out procedure is performed.   Percutaneous Vascular Access:  Percutaneous arterial and venous access were obtained on the right side.  Using ultrasound guidance the right common femoral vein was cannulated using the Seldinger technique and a pair of Perclose vascular closure devises were placed at opposing 30 degree angles in the femoral vein, after which time an 8 French sheath inserted.  The right common femoral artery was cannulated using a micropuncture wire and sheath.  A pair of Perclose vascular closure devices were placed at opposing 30 degree angles in the femoral artery, and a 8 French sheath inserted.  The right internal jugular vein was cannulated  using ultrasound guidance and an 8 French sheath inserted.     Surgical Approach:  A right miniature anterolateral thoracotomy incision is performed. The incision is placed just lateral to and superior to the  right nipple. The pectoralis major muscle is retracted medially and completely preserved. The right pleural space is entered through the 3rd intercostal space. A soft tissue retractor is placed.  Two 11 mm ports are placed through separate stab incisions inferiorly. The right pleural space is insufflated continuously with carbon dioxide gas through the posterior port during the remainder of the operation.  A longitudinal incision is made in the pericardium 3 cm anterior to the phrenic nerve and silk traction sutures are placed on either side of the incision for exposure.   Extracorporeal Cardiopulmonary Bypass and Myocardial Protection:   The patient was heparinized systemically.  The right common femoral vein is cannulated through the venous sheath and a guidewire advanced into the right atrium using TEE guidance.  The femoral vein cannulated using a 22 Fr long femoral venous cannula.  The right common femoral artery is cannulated through the arterial sheath and a guidewire advanced into the descending thoracic aorta using TEE guidance.  Femoral artery is cannulated with a 16 French femoral arterial cannula.  The right internal jugular vein is cannulated through the venous sheath and a guidewire advanced into the right atrium.  The internal jugular vein is cannulated using a 14 French pediatric femoral venous cannula.   Adequate heparinization is verified.   The entire pre-bypass portion of the operation was notable for stable hemodynamics.  Cardiopulmonary bypass was begun.  Vacuum assist venous drainage is utilized. The incision in the pericardium is extended in both directions. Venous drainage and exposure are notably excellent.    Clipping of Left Atrial Appendage:  The left atrial appendage is obliterated using an Atricure left atrial appendage clip (Atriclip Pro245, size 35mm).  The clip is applied under  thoracoscopic visualization posterior to the aorta and pulmonary artery through the oblique  sinus.  The clip was applied prior to application of the aortic crossclamp, with transesophageal echocardiographic confirmation that the clip satisfactorily obliterates the appendage.   Myocardial Protection:  A retrograde cardioplegia cannula is placed through the right atrium into the coronary sinus using transesophageal echocardiogram guidance.  An antegrade cardioplegia cannula is placed in the ascending aorta.  The patient is cooled to 32C systemic temperature.  The aortic cross clamp is applied and cardioplegia is delivered initially in an antegrade fashion through the aortic root using modified [place] cold blood cardioplegia (Kennestone blood cardioplegia protocol).   The initial cardioplegic arrest is rapid with early diastolic arrest.  Repeat doses of cardioplegia are administered at 90 minutes and every 30 minutes thereafter through the coronary sinus catheter in order to maintain completely flat electrocardiogram.  Myocardial protection was felt to be excellent.   Maze Procedure (left atrial lesion set):  Following placement of the aortic crossclamp and the administration of the initial arresting dose of cardioplegia, Waterston's groove is dissected.  A left atriotomy incision was performed through the interatrial groove and extended partially across the back wall of the left atrium after opening the oblique sinus inferiorly.  The mitral valve and floor of the left atrium are exposed using a self-retaining retractor.    The Atricure CryoICE nitrous oxide cryothermy system is utilized for all cryothermy ablation lesions using 3 minute duration.  The AtriCure Synergy bipolar radiofrequency ablation clamp is utilized for all bipolar radiofrequency ablation lesions.  The left atrial lesion set of the Cox maze IV procedure is performed.  The entire left sided lesion set is completed using cryothermy.  A cryothermy lesion is placed along the endocardial surface of the left atrium from the caudad  apex of the atriotomy incision across the posterior wall of the left atrium onto the posterior mitral annulus.  A mirror image lesion along the epicardial surface is then performed with the probe posterior to the left atrium, crossing over the coronary sinus.  Two lesions are then performed to create a box isolating all of the pulmonary veins from the remainder of the left atrium.  The first lesion is placed from the cephalad apex of the atriotomy incision across the dome of the left atrium to just anterior to the left sided pulmonary veins.  This lesion connects to the base of the left atrial appendage.  The second lesion completes the box from the caudad apex of the atriotomy incision across the back wall of the left atrium to connect with the previous lesion just anterior to the left sided pulmonary veins.     Mitral Valve Repair:  The mitral valve was inspected and notable for fibroelastic deficiency type myxomatous degenerative disease.  There is severe prolapse involving the A2 and A3 segments of the anterior leaflet.  The posterior half of the primary chordae tendon any from the posterior medial papillary muscle are clearly ruptured with severe flail involving the posterior aspect of A2 and all of A3.  Artificial neochord placement was performed using Chord-X multi-strand CV-4 Goretex pre-measured loops.  The appropriate cord length (76mm) was measured from corresponding normal length primary cords from the A1 segment of the anterior leaflet. The papillary muscle suture of a Chord-X multi-strand suture was placed through the head of the posteromedial papillary muscle in a horizontal mattress fashion and tied over Teflon felt pledgets. Each of the three pre-measured loops were then reimplanted into  the free margin of the A2 and A3 segments of the anterior leaflet on the posteromedial side of midline.    Interrupted 2-0 Ethibond horizontal mattress sutures are placed circumferentially around the entire  mitral valve annulus. The sutures will ultimately be utilized for ring annuloplasty, and at this juncture there are utilized to suspend the valve symmetrically.  The valve is tested with saline and appears competent.  The valve is tested with saline and appears reasonably competent even prior to ring annuloplasty.  The valve is sized to accept a 64mm annuloplasty ring based upon the distance between the left and right commissures, the height and the surface area of the anterior leaflet.  A Sorin Memo 4D annuloplasty ring (size 73mm, catalog # [id number], serial # [id number]) is implanted uneventfully.  All ring sutures were secured using a Cor-knot device.  The valve is again tested with saline and remains competent, although there is a tendency for the A2 and A3 segment to override the corresponding P2 and P3 segments of the posterior leaflet.  The papillary muscle suture of a second Chord-X multi-strand suture was placed through the head of the posteromedial papillary muscle in a horizontal mattress fashion and tied over Teflon felt pledgets several millimeters deep to the first Chord-X multi-strand suture. Each of the three pre-measured loops were then reimplanted into the free margin of the A2 and A3 segments of the anterior leaflet on the posteromedial side of midline, so as to further shorten the length of the artificial chords.    The valve is again tested with saline and appears to be perfectly competent with a broad symmetrical line of coaptation of the anterior and posterior leaflet. There is no residual leak and no residual overriding of the repaired anterior leaflet. Rewarming is begun.   Maze Procedure (right atrial lesion set):  A small incision is made in the posterior wall of the right atrium immediately posterior to the fossa ovalis.  The incision is extended a short distance anteriorly along the lateral wall of the right atrium towards the acute margin.  The AtriCure Synergy bipolar  radiofrequency ablation clamp is utilized to create a series of linear lesions in the right atrium.  The first lesion is performed from the posterolateral apex of the right atriotomy incision across the posterior right atrium in a cephalad direction onto the posterior superior vena cava.  This lesion is performed with one limb of the bipolar clamp along the endocardial surface of the right atrium and the other across the epicardial surface and into the left atrium to completely avoid the sinus node activation complex.  A second lesion is placed in the opposite direction from the posterior apex of the right atriotomy incision along the posterolateral wall to reach the lateral aspect of the inferior vena cava. A third lesion is placed from anterior apex of the right atriotomy incision in an oblique orientation towards the acute margin of the heart, with care to stay inferior and well away from the sinus node activation complex region.  The left atriotomy was closed using a 2-layer closure of running 3-0 Prolene suture after placing a sump drain across the mitral valve to serve as a left ventricular vent.  One final dose of warm retrograde "reanimation dose" cardioplegia was administered retrograde through the coronary sinus catheter while all air was evacuated through the aortic root.  The aortic cross clamp was removed after a total cross clamp time of 138 minutes.  The posterolateral right atriotomy incision is closed  with a 2-layer closure of running 3-0 Prolen suture.  The retrograde cardioplegia cannula was removed.  A second small right atriotomy incision is made at the anterior end of the bipolar lesion close to the acute margin.  A fourth bipolar lesion is placed from the anterior apex of this atriotomy incision in an anterior direction to the tip of the right atrial appendage.   Finally, the cryotherapy probe is utilized to complete the right atrial lesion set by placing the probe along the endocardial  surface of the right atrium from the anterior apex of the atriotomy incision to cross the acute margin and reach the tricuspid annulus at the 2:00 position. The second right atriotomy incision is closed with a 2 layer closure of running 4-0 Prolene suture.   Median Sternotomy for Repair of Left Ventricle Free-wall Laceration:  The left ventricular vent and antegrade cardioplegia cannula are removed.  After removal of the left ventricular vent bleeding immediately develops from behind the heart.  Initially the clip around the left atrial appendage is examined, and it appears there may be bleeding from adjacent to the clip.  A pledgeted Prolene sutures placed just beneath the clip but additional bleeding is noted from further around the backside of the heart.  It becomes apparent that median sternotomy will be required for adequate exposure.  A median sternotomy incision was performed.  The pericardium is opened straight anterior and silk sutures are placed in the left side of the pericardium to suspend the heart.  The heart is gently retracted towards the surgeon side of the table and laceration of the anterolateral free wall of the left ventricle is identified.  Because of the precarious location of the laceration close to the coronary arteries, a decision is made to proceed with reapplication of aortic cross-clamp for direct repair under interested heart.  An antegrade cardioplegia cannula is placed directly into the ascending aorta.  The aortic cross-clamp was applied and cold blood cardioplegia administered through the aortic root.  The laceration in the anterolateral wall of the left ventricle is repaired using a series of interrupted 2-0 Ethibond sutures with large Teflon felt pledgets.  Additional pledgeted sutures are placed around the base of the left atrial appendage.  The aortic cross-clamp was removed after a second cross-clamp time of 15 minutes.    Procedure Completion:  The heart begins  to be spontaneously.  The left ventricular free wall repair is carefully examined and appears intact.  The patient is rewarmed to 37C temperature.  Epicardial pacing wires are fixed to the inferior wall of the right ventricule and to the right atrial appendage.  The patient is weaned and disconnected from cardiopulmonary bypass.  The patient's rhythm at separation from bypass was AV paced.  The patient was weaned from bypass  without any inotropic support. Total cardiopulmonary bypass time for the operation was 240 minutes.  Followup transesophageal echocardiogram performed after separation from bypass revealed  a well-seated annuloplasty ring in the mitral position with a normal functioning mitral valve. There was no residual leak.  Left ventricular function was globally decreased in comparison with preoperatively, but there are no regional wall motion abnormalities and no other findings to suggest coronary ischemia.  Milrinone infusion is begun.  The patient is observed for period of time and remains hemodynamically stable.  There is no sign of persistent bleeding from the left atrial appendage or the left ventricular free wall.  The femoral arterial and venous cannulas were removed and all Perclose sutures  secured.  Manual pressure was maintained while Protamine was administered.  The right internal jugular cannula was removed and manual pressure held on the neck and groin for 15 minutes.  Single lung ventilation was begun. The atriotomy closure was inspected for hemostasis.  The right pleural space is irrigated with saline solution and inspected for hemostasis.   The right pleural space was drained using a pair of 32 French Bard drains placed through the port incisions. The miniature thoracotomy incision was closed in multiple layers in routine fashion.   The median sternotomy is inspected for hemostasis.  There is severe coagulopathy.  The patient received a total of 2 packs adult platelets, 2 units  fresh frozen plasma, and 1 pack cryoprecipitate due to coagulopathy and thrombocytopenia after separation from cardiopulmonary bypass and reversal of heparin with protamine.  The mediastinum was drained using 36 French straight chest tube and a 32 French Bard drain.  The sternum was closed using Fibertape cerclage.  The soft tissues anterior to the sternum are closed multiple layers and the skin is closed with subcuticular skin closure.  The post-bypass portion of the operation was notable for stable rhythm and hemodynamics.  The patient received 3 units packed red blood cells during the procedure due to acute blood loss anemia and hemodilution during cardiopulmonary bypass.   Disposition:  The patient tolerated the procedure well.  The patient was reintubated using a single lumen endotracheal tube and subsequently transported to the surgical intensive care unit in stable condition.  All sponge instrument and needle counts are verified correct at completion of the operation.    Valentina Gu. Roxy Manns MD 01/17/2020 4:38 PM

## 2020-01-17 NOTE — Progress Notes (Signed)
1st call to ICU @ 1430 2nd call to ICU @ 1628 Last call to ICU @ 1708

## 2020-01-17 NOTE — OR Nursing (Signed)
Pt moved at end of case from side to side and placed on roller board with assistance of Of 6 people and anesthesia calling the move. Pt was kept in alignment and moved very slowly.

## 2020-01-17 NOTE — Anesthesia Procedure Notes (Addendum)
Central Venous Catheter Insertion Performed by: Roberts Gaudy, MD, anesthesiologist Start/End7/13/2021 7:00 AM, 01/17/2020 7:10 AM Hand hygiene performed , maximum sterile barriers used  and Seldinger technique used Catheter size: 9 Fr Procedure performed without using ultrasound guided technique. Ultrasound Notes:anatomy identified, needle tip was noted to be adjacent to the nerve/plexus identified and no ultrasound evidence of intravascular and/or intraneural injection Attempts: 1 Following insertion, dressing applied, Biopatch and line sutured. Post procedure assessment: blood return through all ports and free fluid flow

## 2020-01-18 ENCOUNTER — Inpatient Hospital Stay (HOSPITAL_COMMUNITY): Payer: Medicare Other

## 2020-01-18 ENCOUNTER — Encounter (HOSPITAL_COMMUNITY): Payer: Self-pay | Admitting: Thoracic Surgery (Cardiothoracic Vascular Surgery)

## 2020-01-18 LAB — POCT I-STAT 7, (LYTES, BLD GAS, ICA,H+H)
Acid-Base Excess: 5 mmol/L — ABNORMAL HIGH (ref 0.0–2.0)
Acid-Base Excess: 2 mmol/L (ref 0.0–2.0)
Acid-Base Excess: 7 mmol/L — ABNORMAL HIGH (ref 0.0–2.0)
Acid-Base Excess: 5 mmol/L — ABNORMAL HIGH (ref 0.0–2.0)
Acid-Base Excess: 2 mmol/L (ref 0.0–2.0)
Acid-base deficit: 3 mmol/L — ABNORMAL HIGH (ref 0.0–2.0)
Acid-base deficit: 5 mmol/L — ABNORMAL HIGH (ref 0.0–2.0)
Acid-base deficit: 1 mmol/L (ref 0.0–2.0)
Bicarbonate: 21.7 mmol/L (ref 20.0–28.0)
Bicarbonate: 22.3 mmol/L (ref 20.0–28.0)
Bicarbonate: 24.8 mmol/L (ref 20.0–28.0)
Bicarbonate: 25.8 mmol/L (ref 20.0–28.0)
Bicarbonate: 29.4 mmol/L — ABNORMAL HIGH (ref 20.0–28.0)
Bicarbonate: 31 mmol/L — ABNORMAL HIGH (ref 20.0–28.0)
Bicarbonate: 31.1 mmol/L — ABNORMAL HIGH (ref 20.0–28.0)
Bicarbonate: 32.1 mmol/L — ABNORMAL HIGH (ref 20.0–28.0)
Calcium, Ion: 0.96 mmol/L — ABNORMAL LOW (ref 1.15–1.40)
Calcium, Ion: 0.98 mmol/L — ABNORMAL LOW (ref 1.15–1.40)
Calcium, Ion: 0.82 mmol/L — CL (ref 1.15–1.40)
Calcium, Ion: 0.87 mmol/L — CL (ref 1.15–1.40)
Calcium, Ion: 0.9 mmol/L — ABNORMAL LOW (ref 1.15–1.40)
Calcium, Ion: 1.04 mmol/L — ABNORMAL LOW (ref 1.15–1.40)
Calcium, Ion: 1.18 mmol/L (ref 1.15–1.40)
Calcium, Ion: 1.21 mmol/L (ref 1.15–1.40)
HCT: 20 % — ABNORMAL LOW (ref 36.0–46.0)
HCT: 20 % — ABNORMAL LOW (ref 36.0–46.0)
HCT: 23 % — ABNORMAL LOW (ref 36.0–46.0)
HCT: 26 % — ABNORMAL LOW (ref 36.0–46.0)
HCT: 26 % — ABNORMAL LOW (ref 36.0–46.0)
HCT: 26 % — ABNORMAL LOW (ref 36.0–46.0)
HCT: 30 % — ABNORMAL LOW (ref 36.0–46.0)
HCT: 32 % — ABNORMAL LOW (ref 36.0–46.0)
Hemoglobin: 6.8 g/dL — CL (ref 12.0–15.0)
Hemoglobin: 6.8 g/dL — CL (ref 12.0–15.0)
Hemoglobin: 10.2 g/dL — ABNORMAL LOW (ref 12.0–15.0)
Hemoglobin: 10.9 g/dL — ABNORMAL LOW (ref 12.0–15.0)
Hemoglobin: 7.8 g/dL — ABNORMAL LOW (ref 12.0–15.0)
Hemoglobin: 8.8 g/dL — ABNORMAL LOW (ref 12.0–15.0)
Hemoglobin: 8.8 g/dL — ABNORMAL LOW (ref 12.0–15.0)
Hemoglobin: 8.8 g/dL — ABNORMAL LOW (ref 12.0–15.0)
O2 Saturation: 93 %
O2 Saturation: 98 %
O2 Saturation: 100 %
O2 Saturation: 100 %
O2 Saturation: 100 %
O2 Saturation: 100 %
O2 Saturation: 100 %
O2 Saturation: 100 %
Patient temperature: 36.4
Patient temperature: 36.6
Potassium: 3.3 mmol/L — ABNORMAL LOW (ref 3.5–5.1)
Potassium: 3.5 mmol/L (ref 3.5–5.1)
Potassium: 3.7 mmol/L (ref 3.5–5.1)
Potassium: 3.8 mmol/L (ref 3.5–5.1)
Potassium: 3.9 mmol/L (ref 3.5–5.1)
Potassium: 3.9 mmol/L (ref 3.5–5.1)
Potassium: 4.2 mmol/L (ref 3.5–5.1)
Potassium: 4.3 mmol/L (ref 3.5–5.1)
Sodium: 145 mmol/L (ref 135–145)
Sodium: 148 mmol/L — ABNORMAL HIGH (ref 135–145)
Sodium: 142 mmol/L (ref 135–145)
Sodium: 143 mmol/L (ref 135–145)
Sodium: 145 mmol/L (ref 135–145)
Sodium: 145 mmol/L (ref 135–145)
Sodium: 146 mmol/L — ABNORMAL HIGH (ref 135–145)
Sodium: 146 mmol/L — ABNORMAL HIGH (ref 135–145)
TCO2: 23 mmol/L (ref 22–32)
TCO2: 23 mmol/L (ref 22–32)
TCO2: 26 mmol/L (ref 22–32)
TCO2: 27 mmol/L (ref 22–32)
TCO2: 31 mmol/L (ref 22–32)
TCO2: 32 mmol/L (ref 22–32)
TCO2: 33 mmol/L — ABNORMAL HIGH (ref 22–32)
TCO2: 34 mmol/L — ABNORMAL HIGH (ref 22–32)
pCO2 arterial: 38.4 mmHg (ref 32.0–48.0)
pCO2 arterial: 51 mmHg — ABNORMAL HIGH (ref 32.0–48.0)
pCO2 arterial: 37.6 mmHg (ref 32.0–48.0)
pCO2 arterial: 41.3 mmHg (ref 32.0–48.0)
pCO2 arterial: 46.5 mmHg (ref 32.0–48.0)
pCO2 arterial: 56 mmHg — ABNORMAL HIGH (ref 32.0–48.0)
pCO2 arterial: 56.5 mmHg — ABNORMAL HIGH (ref 32.0–48.0)
pCO2 arterial: 56.6 mmHg — ABNORMAL HIGH (ref 32.0–48.0)
pH, Arterial: 7.235 — ABNORMAL LOW (ref 7.350–7.450)
pH, Arterial: 7.37 (ref 7.350–7.450)
pH, Arterial: 7.324 — ABNORMAL LOW (ref 7.350–7.450)
pH, Arterial: 7.335 — ABNORMAL LOW (ref 7.350–7.450)
pH, Arterial: 7.351 (ref 7.350–7.450)
pH, Arterial: 7.362 (ref 7.350–7.450)
pH, Arterial: 7.444 (ref 7.350–7.450)
pH, Arterial: 7.485 — ABNORMAL HIGH (ref 7.350–7.450)
pO2, Arterial: 80 mmHg — ABNORMAL LOW (ref 83.0–108.0)
pO2, Arterial: 99 mmHg (ref 83.0–108.0)
pO2, Arterial: 225 mmHg — ABNORMAL HIGH (ref 83.0–108.0)
pO2, Arterial: 342 mmHg — ABNORMAL HIGH (ref 83.0–108.0)
pO2, Arterial: 388 mmHg — ABNORMAL HIGH (ref 83.0–108.0)
pO2, Arterial: 431 mmHg — ABNORMAL HIGH (ref 83.0–108.0)
pO2, Arterial: 538 mmHg — ABNORMAL HIGH (ref 83.0–108.0)
pO2, Arterial: 590 mmHg — ABNORMAL HIGH (ref 83.0–108.0)

## 2020-01-18 LAB — POCT I-STAT, CHEM 8
BUN: 12 mg/dL (ref 8–23)
BUN: 15 mg/dL (ref 8–23)
BUN: 14 mg/dL (ref 8–23)
BUN: 15 mg/dL (ref 8–23)
BUN: 13 mg/dL (ref 8–23)
BUN: 14 mg/dL (ref 8–23)
Calcium, Ion: 0.93 mmol/L — ABNORMAL LOW (ref 1.15–1.40)
Calcium, Ion: 1.21 mmol/L (ref 1.15–1.40)
Calcium, Ion: 1.02 mmol/L — ABNORMAL LOW (ref 1.15–1.40)
Calcium, Ion: 0.86 mmol/L — CL (ref 1.15–1.40)
Calcium, Ion: 0.87 mmol/L — CL (ref 1.15–1.40)
Calcium, Ion: 0.83 mmol/L — CL (ref 1.15–1.40)
Chloride: 102 mmol/L (ref 98–111)
Chloride: 104 mmol/L (ref 98–111)
Chloride: 103 mmol/L (ref 98–111)
Chloride: 104 mmol/L (ref 98–111)
Chloride: 108 mmol/L (ref 98–111)
Chloride: 108 mmol/L (ref 98–111)
Creatinine, Ser: 0.3 mg/dL — ABNORMAL LOW (ref 0.44–1.00)
Creatinine, Ser: 0.4 mg/dL — ABNORMAL LOW (ref 0.44–1.00)
Creatinine, Ser: 0.4 mg/dL — ABNORMAL LOW (ref 0.44–1.00)
Creatinine, Ser: 0.4 mg/dL — ABNORMAL LOW (ref 0.44–1.00)
Creatinine, Ser: 0.3 mg/dL — ABNORMAL LOW (ref 0.44–1.00)
Creatinine, Ser: 0.3 mg/dL — ABNORMAL LOW (ref 0.44–1.00)
Glucose, Bld: 104 mg/dL — ABNORMAL HIGH (ref 70–99)
Glucose, Bld: 118 mg/dL — ABNORMAL HIGH (ref 70–99)
Glucose, Bld: 134 mg/dL — ABNORMAL HIGH (ref 70–99)
Glucose, Bld: 148 mg/dL — ABNORMAL HIGH (ref 70–99)
Glucose, Bld: 151 mg/dL — ABNORMAL HIGH (ref 70–99)
Glucose, Bld: 164 mg/dL — ABNORMAL HIGH (ref 70–99)
HCT: 21 % — ABNORMAL LOW (ref 36.0–46.0)
HCT: 27 % — ABNORMAL LOW (ref 36.0–46.0)
HCT: 23 % — ABNORMAL LOW (ref 36.0–46.0)
HCT: 25 % — ABNORMAL LOW (ref 36.0–46.0)
HCT: 22 % — ABNORMAL LOW (ref 36.0–46.0)
HCT: 26 % — ABNORMAL LOW (ref 36.0–46.0)
Hemoglobin: 7.1 g/dL — ABNORMAL LOW (ref 12.0–15.0)
Hemoglobin: 9.2 g/dL — ABNORMAL LOW (ref 12.0–15.0)
Hemoglobin: 7.8 g/dL — ABNORMAL LOW (ref 12.0–15.0)
Hemoglobin: 8.5 g/dL — ABNORMAL LOW (ref 12.0–15.0)
Hemoglobin: 7.5 g/dL — ABNORMAL LOW (ref 12.0–15.0)
Hemoglobin: 8.8 g/dL — ABNORMAL LOW (ref 12.0–15.0)
Potassium: 3.7 mmol/L (ref 3.5–5.1)
Potassium: 3.9 mmol/L (ref 3.5–5.1)
Potassium: 3.9 mmol/L (ref 3.5–5.1)
Potassium: 4.9 mmol/L (ref 3.5–5.1)
Potassium: 4.2 mmol/L (ref 3.5–5.1)
Potassium: 4.2 mmol/L (ref 3.5–5.1)
Sodium: 142 mmol/L (ref 135–145)
Sodium: 144 mmol/L (ref 135–145)
Sodium: 142 mmol/L (ref 135–145)
Sodium: 143 mmol/L (ref 135–145)
Sodium: 146 mmol/L — ABNORMAL HIGH (ref 135–145)
Sodium: 146 mmol/L — ABNORMAL HIGH (ref 135–145)
TCO2: 27 mmol/L (ref 22–32)
TCO2: 30 mmol/L (ref 22–32)
TCO2: 30 mmol/L (ref 22–32)
TCO2: 31 mmol/L (ref 22–32)
TCO2: 26 mmol/L (ref 22–32)
TCO2: 25 mmol/L (ref 22–32)

## 2020-01-18 LAB — PREPARE PLATELET PHERESIS
Unit division: 0
Unit division: 0
Unit division: 0
Unit division: 0
Unit division: 0

## 2020-01-18 LAB — GLUCOSE, CAPILLARY
Glucose-Capillary: 105 mg/dL — ABNORMAL HIGH (ref 70–99)
Glucose-Capillary: 120 mg/dL — ABNORMAL HIGH (ref 70–99)
Glucose-Capillary: 121 mg/dL — ABNORMAL HIGH (ref 70–99)
Glucose-Capillary: 122 mg/dL — ABNORMAL HIGH (ref 70–99)
Glucose-Capillary: 124 mg/dL — ABNORMAL HIGH (ref 70–99)
Glucose-Capillary: 139 mg/dL — ABNORMAL HIGH (ref 70–99)
Glucose-Capillary: 145 mg/dL — ABNORMAL HIGH (ref 70–99)

## 2020-01-18 LAB — PREPARE FRESH FROZEN PLASMA: Unit division: 0

## 2020-01-18 LAB — BPAM FFP
Blood Product Expiration Date: 202107182359
Blood Product Expiration Date: 202107182359
Blood Product Expiration Date: 202107182359
Blood Product Expiration Date: 202107182359
ISSUE DATE / TIME: 202107131459
ISSUE DATE / TIME: 202107131459
ISSUE DATE / TIME: 202107131932
ISSUE DATE / TIME: 202107131932
Unit Type and Rh: 1700
Unit Type and Rh: 7300
Unit Type and Rh: 7300
Unit Type and Rh: 7300

## 2020-01-18 LAB — BASIC METABOLIC PANEL WITH GFR
Anion gap: 8 (ref 5–15)
BUN: 12 mg/dL (ref 8–23)
CO2: 24 mmol/L (ref 22–32)
Calcium: 6.7 mg/dL — ABNORMAL LOW (ref 8.9–10.3)
Chloride: 111 mmol/L (ref 98–111)
Creatinine, Ser: 0.53 mg/dL (ref 0.44–1.00)
GFR calc Af Amer: >60 mL/min
GFR calc non Af Amer: >60 mL/min
Glucose, Bld: 132 mg/dL — ABNORMAL HIGH (ref 70–99)
Potassium: 3.9 mmol/L (ref 3.5–5.1)
Sodium: 143 mmol/L (ref 135–145)

## 2020-01-18 LAB — CBC
HCT: 24.1 % — ABNORMAL LOW (ref 36.0–46.0)
HCT: 25 % — ABNORMAL LOW (ref 36.0–46.0)
Hemoglobin: 7.9 g/dL — ABNORMAL LOW (ref 12.0–15.0)
Hemoglobin: 8.1 g/dL — ABNORMAL LOW (ref 12.0–15.0)
MCH: 29.9 pg (ref 26.0–34.0)
MCH: 29.5 pg (ref 26.0–34.0)
MCHC: 32.8 g/dL (ref 30.0–36.0)
MCHC: 32.4 g/dL (ref 30.0–36.0)
MCV: 91.3 fL (ref 80.0–100.0)
MCV: 90.9 fL (ref 80.0–100.0)
Platelets: 192 10*3/uL (ref 150–400)
Platelets: 218 10*3/uL (ref 150–400)
RBC: 2.64 MIL/uL — ABNORMAL LOW (ref 3.87–5.11)
RBC: 2.75 MIL/uL — ABNORMAL LOW (ref 3.87–5.11)
RDW: 14.5 % (ref 11.5–15.5)
RDW: 15.1 % (ref 11.5–15.5)
WBC: 7 10*3/uL (ref 4.0–10.5)
WBC: 11.1 10*3/uL — ABNORMAL HIGH (ref 4.0–10.5)
nRBC: 0 % (ref 0.0–0.2)
nRBC: 0 % (ref 0.0–0.2)

## 2020-01-18 LAB — BPAM PLATELET PHERESIS
Blood Product Expiration Date: 202107152359
Blood Product Expiration Date: 202107162359
Blood Product Expiration Date: 202107152359
Blood Product Expiration Date: 202107162359
Blood Product Expiration Date: 202107162359
ISSUE DATE / TIME: 202107131436
ISSUE DATE / TIME: 202107131436
ISSUE DATE / TIME: 202107131718
ISSUE DATE / TIME: 202107131926
ISSUE DATE / TIME: 202107131926
Unit Type and Rh: 6200
Unit Type and Rh: 7300
Unit Type and Rh: 600
Unit Type and Rh: 6200
Unit Type and Rh: 9500

## 2020-01-18 LAB — BASIC METABOLIC PANEL
Anion gap: 10 (ref 5–15)
BUN: 11 mg/dL (ref 8–23)
CO2: 21 mmol/L — ABNORMAL LOW (ref 22–32)
Calcium: 6.8 mg/dL — ABNORMAL LOW (ref 8.9–10.3)
Chloride: 112 mmol/L — ABNORMAL HIGH (ref 98–111)
Creatinine, Ser: 0.62 mg/dL (ref 0.44–1.00)
GFR calc Af Amer: >60 mL/min (ref >60)
GFR calc non Af Amer: >60 mL/min (ref >60)
Glucose, Bld: 155 mg/dL — ABNORMAL HIGH (ref 70–99)
Potassium: 3.7 mmol/L (ref 3.5–5.1)
Sodium: 143 mmol/L (ref 135–145)

## 2020-01-18 LAB — PREPARE CRYOPRECIPITATE
Unit division: 0
Unit division: 0
Unit division: 0

## 2020-01-18 LAB — BPAM CRYOPRECIPITATE
Blood Product Expiration Date: 202107132030
Blood Product Expiration Date: 202107132243
Blood Product Expiration Date: 202107140356
ISSUE DATE / TIME: 202107131449
ISSUE DATE / TIME: 202107131715
ISSUE DATE / TIME: 202107132238
Unit Type and Rh: 5100
Unit Type and Rh: 5100
Unit Type and Rh: 5100

## 2020-01-18 LAB — MAGNESIUM
Magnesium: 2.8 mg/dL — ABNORMAL HIGH (ref 1.7–2.4)
Magnesium: 2.4 mg/dL (ref 1.7–2.4)

## 2020-01-18 MED ORDER — WARFARIN - PHYSICIAN DOSING INPATIENT: Freq: Every day | Status: DC

## 2020-01-18 MED ORDER — INSULIN ASPART 100 UNIT/ML ~~LOC~~ SOLN
0.0000 [IU] | SUBCUTANEOUS | Status: DC
  Administered 2020-01-18 (×2): 2 [IU] via SUBCUTANEOUS

## 2020-01-18 MED ORDER — POTASSIUM CHLORIDE 10 MEQ/50ML IV SOLN
10.0000 meq | INTRAVENOUS | Status: AC
  Administered 2020-01-18 (×3): 10 meq via INTRAVENOUS
  Filled 2020-01-18 (×3): qty 50

## 2020-01-18 MED ORDER — FENTANYL 25 MCG/HR TD PT72
1.0000 | MEDICATED_PATCH | TRANSDERMAL | Status: DC
  Administered 2020-01-18 – 2020-01-21 (×2): 1 via TRANSDERMAL
  Filled 2020-01-18 (×2): qty 1

## 2020-01-18 MED ORDER — OXYCODONE HCL 5 MG/5ML PO SOLN
5.0000 mg | ORAL | Status: DC | PRN
  Administered 2020-01-18 – 2020-01-19 (×5): 10 mg via ORAL
  Filled 2020-01-18 (×5): qty 10

## 2020-01-18 MED ORDER — ENOXAPARIN SODIUM 30 MG/0.3ML ~~LOC~~ SOLN
30.0000 mg | Freq: Every day | SUBCUTANEOUS | Status: DC
  Administered 2020-01-19 – 2020-01-20 (×2): 30 mg via SUBCUTANEOUS
  Filled 2020-01-18 (×2): qty 0.3

## 2020-01-18 MED ORDER — FUROSEMIDE 10 MG/ML IJ SOLN
20.0000 mg | Freq: Four times a day (QID) | INTRAMUSCULAR | Status: AC
  Administered 2020-01-18 – 2020-01-19 (×3): 20 mg via INTRAVENOUS
  Filled 2020-01-18 (×3): qty 2

## 2020-01-18 MED ORDER — WARFARIN SODIUM 2.5 MG PO TABS
2.5000 mg | ORAL_TABLET | Freq: Every day | ORAL | Status: DC
  Administered 2020-01-18 – 2020-01-20 (×3): 2.5 mg via ORAL
  Filled 2020-01-18 (×3): qty 1

## 2020-01-18 MED ORDER — ORAL CARE MOUTH RINSE
15.0000 mL | Freq: Two times a day (BID) | OROMUCOSAL | Status: DC
  Administered 2020-01-18 – 2020-02-01 (×19): 15 mL via OROMUCOSAL
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
## 2020-01-18 NOTE — Progress Notes (Signed)
      [address]       [place] [address]             [phone]       POD # 1 mitral repair, Maze  Sleeping currently  BP (!) 103/52   Pulse 86   Temp 97.7 F (36.5 C) (Oral)   Resp (!) 34   Ht 5\' 6"  (1.676 m)   Wt 66.8 kg   SpO2 94%   BMI 23.76 kg/m  Milrinone 0.25 and neo  Intake/Output Summary (Last 24 hours) at 01/18/2020 1937 Last data filed at 01/18/2020 1857 Gross per 24 hour  Intake 5064.87 ml  Output 3125 ml  Net 1939.87 ml   K= 3.9, creatinine 0.53 HCT= 25 CBG well controlled  Lasix q6  [name] C. Roxan Hockey, MD [facility] [phone]

## 2020-01-18 NOTE — Anesthesia Postprocedure Evaluation (Signed)
Anesthesia Post Note  Patient: [name]  Procedure(s) Performed: MINIMALLY INVASIVE MITRAL VALVE REPAIR (MVR) USING MEMO 4D 32MM (Right Chest) MINIMALLY INVASIVE MAZE PROCEDURE (N/A ) TRANSESOPHAGEAL ECHOCARDIOGRAM (TEE) (N/A )     Patient location during evaluation: SICU Anesthesia Type: General Level of consciousness: sedated Pain management: pain level controlled Vital Signs Assessment: post-procedure vital signs reviewed and stable Respiratory status: patient remains intubated per anesthesia plan Cardiovascular status: stable Postop Assessment: no apparent nausea or vomiting Anesthetic complications: no   No complications documented.  Last Vitals:  Vitals:   01/18/20 1000 01/18/20 1100  BP: (!) 94/56 106/60  Pulse: 86 86  Resp: (!) 40 11  Temp: 36.7 C 36.8 C  SpO2: 93% 94%    Last Pain:  Vitals:   01/18/20 1039  TempSrc:   PainSc: 5                  Tieshia Rettinger

## 2020-01-18 NOTE — Anesthesia Preprocedure Evaluation (Signed)
Anesthesia Evaluation  Patient identified by MRN, date of birth, ID band Patient awake    Reviewed: Allergy & Precautions, NPO status , Patient's Chart, lab work & pertinent test results, reviewed documented beta blocker date and time   History of Anesthesia Complications (+) PONV and history of anesthetic complications  Airway Mallampati: III  TM Distance: <3 FB Neck ROM: Limited    Dental  (+) Dental Advisory Given   Pulmonary neg recent URI, former smoker,    breath sounds clear to auscultation       Cardiovascular hypertension, Pt. on home beta blockers and Pt. on medications + dysrhythmias Atrial Fibrillation + Valvular Problems/Murmurs MR  Rhythm:Irregular + Systolic murmurs  1. Left ventricular ejection fraction, by estimation, is 60 to 65%. The  left ventricle has normal function. The left ventricle has no regional  wall motion abnormalities.  2. Right ventricular systolic function is normal. The right ventricular  size is normal.  3. Left atrial size was mildly dilated. No left atrial/left atrial  appendage thrombus was detected.  4. Flail A3 segment of anterior leaflet with very severe posteriorly  directed MR PISA radius 1.4 and diastolic predominence in PV;s . The  mitral valve is myxomatous. Severe mitral valve regurgitation. No evidence  of mitral stenosis.  5. The aortic valve is tricuspid. Aortic valve regurgitation is not  visualized.     Prox RCA lesion is 20% stenosed.  Dist LAD lesion is 20% stenosed.   1. Mild non-obstructive CAD  Recommendation: Continue planning for mitral valve repair.     Neuro/Psych neg Seizures PSYCHIATRIC DISORDERS Anxiety Depression  Neuromuscular disease    GI/Hepatic Neg liver ROS, GERD  Medicated and Controlled,  Endo/Other  negative endocrine ROS  Renal/GU negative Renal ROS     Musculoskeletal  (+) Arthritis ,   Abdominal   Peds  Hematology  (+)  Blood dyscrasia, anemia ,   Anesthesia Other Findings   Reproductive/Obstetrics                             Anesthesia Physical Anesthesia Plan  ASA: IV  Anesthesia Plan: General   Post-op Pain Management:    Induction: Intravenous  PONV Risk Score and Plan: 4 or greater and Treatment may vary due to age or medical condition  Airway Management Planned: Double Lumen EBT  Additional Equipment: Arterial line, CVP, PA Cath, TEE and Ultrasound Guidance Line Placement  Intra-op Plan:   Post-operative Plan: Possible Post-op intubation/ventilation  Informed Consent: I have reviewed the patients History and Physical, chart, labs and discussed the procedure including the risks, benefits and alternatives for the proposed anesthesia with the patient or authorized representative who has indicated his/her understanding and acceptance.     Dental advisory given  Plan Discussed with: CRNA and Surgeon  Anesthesia Plan Comments:         Anesthesia Quick Evaluation

## 2020-01-18 NOTE — Procedures (Signed)
Extubation Procedure Note  Patient Details:   Name: [name] DOB: 01-16-1946 MRN: [id number]   Airway Documentation:    Vent end date: 01/17/20 Vent end time: 2350   RT extubated patient to 4L [place]. Patient able to get -25 on NIF and VC of 800. Patient able to speak, no stridor noted. Patient in no distress at this time. RN at beside doing IS with patient.  Evaluation  O2 sats: stable throughout Complications: No apparent complications Patient did tolerate procedure well. Bilateral Breath Sounds: Clear, Diminished   Yes  Kelle Darting 01/17/20 2350

## 2020-01-18 NOTE — Progress Notes (Addendum)
[facility] DAILY ICU PROGRESS NOTE                   [address]            [place] [address]          [phone]   1 Day Post-Op Procedure(s) (LRB): MINIMALLY INVASIVE MITRAL VALVE REPAIR (MVR) USING MEMO 4D 32MM (Right) MINIMALLY INVASIVE MAZE PROCEDURE (N/A) TRANSESOPHAGEAL ECHOCARDIOGRAM (TEE) (N/A)  Total Length of Stay:  LOS: 1 day   Subjective:  Patient is asleep,  Just received high dose fentanyl and is on precedex  Objective: Vital signs in last 24 hours: Temp:  [91.8 F (33.2 C)-98.2 F (36.8 C)] 97.9 F (36.6 C) (07/14 0800) Pulse Rate:  [80-88] 86 (07/14 0800) Cardiac Rhythm: Atrial paced (07/14 0400) Resp:  [0-43] 31 (07/14 0800) BP: (83-130)/(48-95) 107/65 (07/14 0800) SpO2:  [82 %-100 %] 97 % (07/14 0800) Arterial Line BP: (87-142)/(46-65) 124/56 (07/14 0800) FiO2 (%):  [40 %-50 %] 40 % (07/13 2320) Weight:  [66.8 kg] 66.8 kg (07/14 0459)  Filed Weights   01/17/20 0606 01/18/20 0459  Weight: 57.4 kg 66.8 kg    Weight change: 9.37 kg   Hemodynamic parameters for last 24 hours: PAP: (24-39)/(9-22) 35/14 CO:  [2.2 L/min-3.9 L/min] 3.9 L/min CI:  [1.3 L/min/m2-2.3 L/min/m2] 2.3 L/min/m2  Intake/Output from previous day: 07/13 0701 - 07/14 0700 In: 9188 [I.V.:3886.4; Blood:4095.6; IV Piggyback:1205.9] Out: 5300 [Urine:2890; Blood:550; Chest Tube:1860]  Intake/Output this shift: Total I/O In: 222.5 [I.V.:140.3; IV Piggyback:82.2] Out: 0   Current Meds: Scheduled Meds: . acetaminophen  1,000 mg Oral Q6H  . aspirin EC  325 mg Oral Daily  . bisacodyl  10 mg Oral Daily   Or  . bisacodyl  10 mg Rectal Daily  . Chlorhexidine Gluconate Cloth  6 each Topical Daily  . docusate sodium  200 mg Oral Daily  . [START ON 01/19/2020] enoxaparin (LOVENOX) injection  30 mg Subcutaneous QHS  . insulin aspart  0-24 Units Subcutaneous Q4H  . mouth rinse  15 mL Mouth Rinse BID  . [START ON 01/19/2020] pantoprazole  40 mg Oral Daily  . sodium  chloride flush  10-40 mL Intracatheter Q12H  . sodium chloride flush  3 mL Intravenous Q12H  . warfarin  2.5 mg Oral q1600  . Warfarin - Physician Dosing Inpatient   Does not apply q1600   Continuous Infusions: . sodium chloride    . cefUROXime (ZINACEF)  IV 1.5 g (01/18/20 0814)  . dexmedetomidine (PRECEDEX) IV infusion 0.2 mcg/kg/hr (01/18/20 0800)  . lactated ringers    . lactated ringers    . milrinone 0.25 mcg/kg/min (01/18/20 0800)  . phenylephrine (NEO-SYNEPHRINE) Adult infusion 50 mcg/min (01/18/20 0800)   PRN Meds:.fentaNYL (SUBLIMAZE) injection, metoprolol tartrate, ondansetron (ZOFRAN) IV, oxyCODONE, sodium chloride flush, sodium chloride flush  General appearance: asleep from pain medication Heart: regular rate and rhythm Lungs: diminished breath sounds bibasilar Abdomen: soft, non-tender; bowel sounds normal; no masses,  no organomegaly Extremities: edema + trace - 1+ Wound: aquacel in place  Lab Results: CBC: Recent Labs    01/17/20 2152 01/17/20 2345 01/18/20 0106 01/18/20 0313  WBC 7.0  --   --  7.0  HGB 8.3*   < > 6.8* 7.9*  HCT 25.5*   < > 20.0* 24.1*  PLT 192  --   --  192   < > = values in this interval not displayed.   BMET:  Recent Labs  01/17/20 2152 01/17/20 2345 01/18/20 0106 01/18/20 0313  NA 143   < > 148* 143  K 3.9   < > 3.3* 3.7  CL 111  --   --  112*  CO2 23  --   --  21*  GLUCOSE 173*  --   --  155*  BUN 10  --   --  11  CREATININE 0.56  --   --  0.62  CALCIUM 6.7*  --   --  6.8*   < > = values in this interval not displayed.    CMET: Lab Results  Component Value Date   WBC 7.0 01/18/2020   HGB 7.9 (L) 01/18/2020   HCT 24.1 (L) 01/18/2020   PLT 192 01/18/2020   GLUCOSE 155 (H) 01/18/2020   CHOL 237 (H) 09/23/2017   TRIG 101.0 09/23/2017   HDL 100.90 09/23/2017   LDLDIRECT 136.5 06/08/2013   LDLCALC 116 (H) 09/23/2017   ALT 20 01/13/2020   AST 27 01/13/2020   NA 143 01/18/2020   K 3.7 01/18/2020   CL 112 (H)  01/18/2020   CREATININE 0.62 01/18/2020   BUN 11 01/18/2020   CO2 21 (L) 01/18/2020   TSH 2.61 09/08/2019   INR 1.9 (H) 01/17/2020   HGBA1C 5.3 01/13/2020      PT/INR:  Recent Labs    01/17/20 1725  LABPROT 21.2*  INR 1.9*   Radiology: [facility] Chest Port 1 View  Result Date: 01/17/2020 CLINICAL DATA:  Mitral valve repair.  Atelectasis. EXAM: PORTABLE CHEST 1 VIEW COMPARISON:  01/13/2020 FINDINGS: Extensive cervicothoracic spinal hardware. Atrial clip noted.  Mitral valve prosthesis noted. Endotracheal tube tip 2.2 cm above the carina. Mediastinal drain in place. Left internal jugular Swan-Ganz catheter tip in the main pulmonary artery. Two right-sided chest tubes are present. A nasogastric tube terminates in the stomach body. No pneumothorax. Low lung volumes with mild interstitial accentuation bilaterally. Subtle interstitial edema not excluded. There is some mild atelectasis at the right lung base. Old right lateral rib deformities noted. Old left rib deformities noted. Bony demineralization. IMPRESSION: 1. Tubes and lines appear satisfactorily positioned. 2. Mild atelectasis at the right lung base. 3. Mild interstitial accentuation could reflect mild interstitial edema. 4. No current pneumothorax identified. Electronically Signed   By: Van Clines M.D.   On: 01/17/2020 18:13     Assessment/Plan: S/P Procedure(s) (LRB): MINIMALLY INVASIVE MITRAL VALVE REPAIR (MVR) USING MEMO 4D 32MM (Right) MINIMALLY INVASIVE MAZE PROCEDURE (N/A) TRANSESOPHAGEAL ECHOCARDIOGRAM (TEE) (N/A)  1. CV- Sinus Bradycardia on Milrinone and Neo-synephrine- wean as tolerated, start coumadin for MV Repair/MAZE procedure 2. Pulm- 1860 cc output, CXR with mild atelectasis bilaterally, leave chest tubes in place 3. Renal- creatinine WNL, weight is elevated, K is at 3.7.Marland KitchenMarland Kitchen defer to diuresis to Dr. Roxy Manns 4. Expected post operative blood loss anemia, Hgb at 7.9, received transfusions overnight 5. Chronic Back  Pain- on Fentanyl and Precedex, will restart home Oxycodone liquid 6. CBGs controlled, continue SSIP for now 7. Dispo- patient in Sinus Bradycardia, wean drips as tolerated, coumadin for MV Repair/MAZE, watch H/H... pain control will likely be an issue, care per Dr. Roxy Manns POD #1 progression orders     Ellwood Handler, PA-C  01/18/2020 8:34 AM    I have seen and examined the patient and agree with the assessment and plan as outlined.  Overall doing well POD1.  Will add fentanyl patch to assist with post op analgesia given her long term opioid dependence.  Rexene Alberts,  MD 01/18/2020 9:24 AM

## 2020-01-18 NOTE — Progress Notes (Signed)
   01/18/20 1100  Clinical Encounter Type  Visited With Patient and family together  Visit Type Initial;Spiritual support  Spiritual Encounters  Spiritual Needs Prayer   Chaplain engaged in initial visit with [name] and her sister, [name].  [name] conveyed that she has been in a lot of pain.  [name] did not expect for her body to have the reaction it has after having a procedure.  She discussed throwing up multiple times and being in pain. Chaplain offered support and prayer over [name] with [name].   Chaplain will continue to follow-up.

## 2020-01-19 ENCOUNTER — Other Ambulatory Visit: Payer: Self-pay

## 2020-01-19 ENCOUNTER — Inpatient Hospital Stay (HOSPITAL_COMMUNITY): Payer: Medicare Other

## 2020-01-19 DIAGNOSIS — Z9889 Other specified postprocedural states: Secondary | ICD-10-CM

## 2020-01-19 DIAGNOSIS — I340 Nonrheumatic mitral (valve) insufficiency: Secondary | ICD-10-CM

## 2020-01-19 LAB — POCT I-STAT, CHEM 8
BUN: 18 mg/dL (ref 8–23)
Calcium, Ion: 0.96 mmol/L — ABNORMAL LOW (ref 1.15–1.40)
Chloride: 104 mmol/L (ref 98–111)
Creatinine, Ser: 0.6 mg/dL (ref 0.44–1.00)
Glucose, Bld: 121 mg/dL — ABNORMAL HIGH (ref 70–99)
HCT: 23 % — ABNORMAL LOW (ref 36.0–46.0)
Hemoglobin: 7.8 g/dL — ABNORMAL LOW (ref 12.0–15.0)
Potassium: 4.3 mmol/L (ref 3.5–5.1)
Sodium: 142 mmol/L (ref 135–145)
TCO2: 27 mmol/L (ref 22–32)

## 2020-01-19 LAB — BASIC METABOLIC PANEL
Anion gap: 8 (ref 5–15)
Anion gap: 6 (ref 5–15)
BUN: 12 mg/dL (ref 8–23)
BUN: 15 mg/dL (ref 8–23)
CO2: 25 mmol/L (ref 22–32)
CO2: 24 mmol/L (ref 22–32)
Calcium: 6.5 mg/dL — ABNORMAL LOW (ref 8.9–10.3)
Calcium: 6.4 mg/dL — CL (ref 8.9–10.3)
Chloride: 108 mmol/L (ref 98–111)
Chloride: 110 mmol/L (ref 98–111)
Creatinine, Ser: 0.69 mg/dL (ref 0.44–1.00)
Creatinine, Ser: 0.69 mg/dL (ref 0.44–1.00)
GFR calc Af Amer: >60 mL/min (ref >60)
GFR calc Af Amer: >60 mL/min (ref >60)
GFR calc non Af Amer: >60 mL/min (ref >60)
GFR calc non Af Amer: >60 mL/min (ref >60)
Glucose, Bld: 116 mg/dL — ABNORMAL HIGH (ref 70–99)
Glucose, Bld: 141 mg/dL — ABNORMAL HIGH (ref 70–99)
Potassium: 3.5 mmol/L (ref 3.5–5.1)
Potassium: 4.2 mmol/L (ref 3.5–5.1)
Sodium: 141 mmol/L (ref 135–145)
Sodium: 140 mmol/L (ref 135–145)

## 2020-01-19 LAB — CBC
HCT: 23 % — ABNORMAL LOW (ref 36.0–46.0)
HCT: 28.3 % — ABNORMAL LOW (ref 36.0–46.0)
Hemoglobin: 7.4 g/dL — ABNORMAL LOW (ref 12.0–15.0)
Hemoglobin: 8.8 g/dL — ABNORMAL LOW (ref 12.0–15.0)
MCH: 29.1 pg (ref 26.0–34.0)
MCH: 26.3 pg (ref 26.0–34.0)
MCHC: 32.2 g/dL (ref 30.0–36.0)
MCHC: 31.1 g/dL (ref 30.0–36.0)
MCV: 90.6 fL (ref 80.0–100.0)
MCV: 84.5 fL (ref 80.0–100.0)
Platelets: 191 10*3/uL (ref 150–400)
Platelets: 167 10*3/uL (ref 150–400)
RBC: 2.54 MIL/uL — ABNORMAL LOW (ref 3.87–5.11)
RBC: 3.35 MIL/uL — ABNORMAL LOW (ref 3.87–5.11)
RDW: 15.1 % (ref 11.5–15.5)
RDW: 22.4 % — ABNORMAL HIGH (ref 11.5–15.5)
WBC: 10.8 10*3/uL — ABNORMAL HIGH (ref 4.0–10.5)
WBC: 9.9 10*3/uL (ref 4.0–10.5)
nRBC: 0 % (ref 0.0–0.2)
nRBC: 0 % (ref 0.0–0.2)

## 2020-01-19 LAB — PROTIME-INR
INR: 1.5 — ABNORMAL HIGH (ref 0.8–1.2)
Prothrombin Time: 17.4 seconds — ABNORMAL HIGH (ref 11.4–15.2)

## 2020-01-19 LAB — COOXEMETRY PANEL
Carboxyhemoglobin: 1.1 % (ref 0.5–1.5)
Methemoglobin: 0.6 % (ref 0.0–1.5)
O2 Saturation: 64.8 %
Total hemoglobin: 7.6 g/dL — ABNORMAL LOW (ref 12.0–16.0)

## 2020-01-19 LAB — GLUCOSE, CAPILLARY
Glucose-Capillary: 85 mg/dL (ref 70–99)
Glucose-Capillary: 111 mg/dL — ABNORMAL HIGH (ref 70–99)
Glucose-Capillary: 115 mg/dL — ABNORMAL HIGH (ref 70–99)
Glucose-Capillary: 99 mg/dL (ref 70–99)
Glucose-Capillary: 114 mg/dL — ABNORMAL HIGH (ref 70–99)

## 2020-01-19 LAB — MAGNESIUM: Magnesium: 2.3 mg/dL (ref 1.7–2.4)

## 2020-01-19 LAB — ECHO INTRAOPERATIVE TEE
AV Mean grad: 2 mmHg
Height: 66 in
LVOT diameter: 20 mm
Weight: 2024.7 oz

## 2020-01-19 LAB — PREPARE RBC (CROSSMATCH)

## 2020-01-19 MED ORDER — ~~LOC~~ CARDIAC SURGERY, PATIENT & FAMILY EDUCATION: Freq: Once | Status: AC

## 2020-01-19 MED ORDER — SODIUM CHLORIDE 0.9 % IV SOLN
1.0000 g | Freq: Once | INTRAVENOUS | Status: AC
  Administered 2020-01-20: 1 g via INTRAVENOUS
  Filled 2020-01-19: qty 10

## 2020-01-19 MED ORDER — OXYCODONE HCL 5 MG/5ML PO SOLN
8.0000 mg | ORAL | Status: DC | PRN
  Administered 2020-01-19 – 2020-01-28 (×40): 8 mg via ORAL
  Filled 2020-01-19 (×43): qty 10

## 2020-01-19 MED ORDER — SODIUM CHLORIDE 0.9% IV SOLUTION: Freq: Once | INTRAVENOUS | Status: AC

## 2020-01-19 MED ORDER — CYCLOBENZAPRINE HCL 10 MG PO TABS
10.0000 mg | ORAL_TABLET | Freq: Every evening | ORAL | Status: DC | PRN
  Administered 2020-01-19 – 2020-02-02 (×4): 10 mg via ORAL
  Filled 2020-01-19 (×7): qty 1

## 2020-01-19 MED ORDER — CLONAZEPAM 0.5 MG PO TABS
0.5000 mg | ORAL_TABLET | Freq: Two times a day (BID) | ORAL | Status: DC
  Administered 2020-01-19 – 2020-02-02 (×29): 0.5 mg via ORAL
  Filled 2020-01-19 (×29): qty 1

## 2020-01-19 MED ORDER — POTASSIUM CHLORIDE 10 MEQ/50ML IV SOLN
10.0000 meq | INTRAVENOUS | Status: AC
  Administered 2020-01-19 (×3): 10 meq via INTRAVENOUS
  Filled 2020-01-19 (×3): qty 50

## 2020-01-19 MED ORDER — MAGNESIUM OXIDE 400 (241.3 MG) MG PO TABS
200.0000 mg | ORAL_TABLET | Freq: Every day | ORAL | Status: DC
  Administered 2020-01-19 – 2020-02-02 (×15): 200 mg via ORAL
  Filled 2020-01-19 (×15): qty 1

## 2020-01-19 MED ORDER — ALBUMIN HUMAN 5 % IV SOLN
12.5000 g | Freq: Once | INTRAVENOUS | Status: AC
  Administered 2020-01-20: 12.5 g via INTRAVENOUS
  Filled 2020-01-19: qty 250

## 2020-01-19 MED ORDER — DOFETILIDE 250 MCG PO CAPS
250.0000 ug | ORAL_CAPSULE | Freq: Two times a day (BID) | ORAL | Status: DC
  Administered 2020-01-19 – 2020-01-27 (×15): 250 ug via ORAL
  Filled 2020-01-19 (×20): qty 1

## 2020-01-19 MED ORDER — POTASSIUM CHLORIDE CRYS ER 20 MEQ PO TBCR
20.0000 meq | EXTENDED_RELEASE_TABLET | Freq: Once | ORAL | Status: AC
  Administered 2020-01-19: 20 meq via ORAL
  Filled 2020-01-19: qty 1

## 2020-01-19 MED ORDER — METOCLOPRAMIDE HCL 5 MG/ML IJ SOLN
10.0000 mg | Freq: Four times a day (QID) | INTRAMUSCULAR | Status: AC
  Administered 2020-01-19 (×2): 10 mg via INTRAVENOUS
  Filled 2020-01-19 (×2): qty 2

## 2020-01-19 MED ORDER — KETOROLAC TROMETHAMINE 15 MG/ML IJ SOLN
15.0000 mg | Freq: Four times a day (QID) | INTRAMUSCULAR | Status: AC
  Administered 2020-01-19 – 2020-01-20 (×5): 15 mg via INTRAVENOUS
  Filled 2020-01-19 (×5): qty 1

## 2020-01-19 MED ORDER — ASPIRIN EC 81 MG PO TBEC
81.0000 mg | DELAYED_RELEASE_TABLET | Freq: Every day | ORAL | Status: DC
  Administered 2020-01-19 – 2020-01-24 (×6): 81 mg via ORAL
  Filled 2020-01-19 (×6): qty 1

## 2020-01-19 MED ORDER — POTASSIUM CHLORIDE 10 MEQ/50ML IV SOLN
10.0000 meq | INTRAVENOUS | Status: AC
  Administered 2020-01-19 (×3): 10 meq via INTRAVENOUS
  Filled 2020-01-19 (×2): qty 50

## 2020-01-19 MED ORDER — POTASSIUM CHLORIDE 10 MEQ/50ML IV SOLN
10.0000 meq | INTRAVENOUS | Status: DC
  Filled 2020-01-19: qty 50

## 2020-01-19 MED ORDER — FUROSEMIDE 10 MG/ML IJ SOLN
20.0000 mg | Freq: Four times a day (QID) | INTRAMUSCULAR | Status: AC
  Administered 2020-01-19 – 2020-01-20 (×3): 20 mg via INTRAVENOUS
  Filled 2020-01-19 (×3): qty 2

## 2020-01-19 MED ORDER — GABAPENTIN 100 MG PO CAPS
100.0000 mg | ORAL_CAPSULE | Freq: Three times a day (TID) | ORAL | Status: DC | PRN
  Administered 2020-01-19 – 2020-02-01 (×17): 100 mg via ORAL
  Filled 2020-01-19 (×17): qty 1

## 2020-01-19 MED FILL — Potassium Chloride Inj 2 mEq/ML: INTRAVENOUS | Qty: 40 | Status: AC

## 2020-01-19 MED FILL — Lidocaine HCl Local Preservative Free (PF) Inj 2%: INTRAMUSCULAR | Qty: 15 | Status: AC

## 2020-01-19 MED FILL — Heparin Sodium (Porcine) Inj 1000 Unit/ML: INTRAMUSCULAR | Qty: 30 | Status: AC

## 2020-01-19 NOTE — Plan of Care (Signed)
Progressing well but struggles with anxiety and pain (chronic back pain and sternotomy/chest tube site discomfort), responds to emotional support and encouragement, fentanyl patch, prn pain meds, precedex gtt overnight, now d/c'ed. Received zofran IV x 2, prn oxy po, fentanyl IV, hot packs.   Hgb 7.4, infusing PRBC x 1 per order.   K = 3.5, replacing IV d/t nausea.  Neo gtt for BP control, titrated throughout night. Milrinone gtt decreased this am per order.   Foley, diuresing with good output after lasix push. Turning q 2 hr for comfort and skin integrity.   Problem: Education: Goal: Knowledge of General Education information will improve Description: Including pain rating scale, medication(s)/side effects and non-pharmacologic comfort measures Outcome: Progressing   Problem: Health Behavior/Discharge Planning: Goal: Ability to manage health-related needs will improve Outcome: Progressing   Problem: Clinical Measurements: Goal: Ability to maintain clinical measurements within normal limits will improve Outcome: Progressing Goal: Will remain free from infection Outcome: Progressing Goal: Diagnostic test results will improve Outcome: Progressing Goal: Respiratory complications will improve Outcome: Progressing Goal: Cardiovascular complication will be avoided Outcome: Progressing   Problem: Elimination: Goal: Will not experience complications related to bowel motility Outcome: Progressing Goal: Will not experience complications related to urinary retention Outcome: Progressing   Problem: Safety: Goal: Ability to remain free from injury will improve Outcome: Progressing   Problem: Skin Integrity: Goal: Risk for impaired skin integrity will decrease Outcome: Progressing

## 2020-01-19 NOTE — Progress Notes (Addendum)
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  Having a lot of pain, this morning mostly in the epigastric area.  She states she is not getting any relief from her current pain medications.  She also is experiencing some nausea.  Objective: Vital signs in last 24 hours: Temp:  [97.7 F (36.5 C)-98.8 F (37.1 C)] 98.1 F (36.7 C) (07/15 0710) Pulse Rate:  [70-92] 72 (07/15 0700) Cardiac Rhythm: Atrial paced (07/15 0400) Resp:  [11-40] 26 (07/15 0700) BP: (84-121)/(43-101) 102/53 (07/15 0710) SpO2:  [90 %-98 %] 92 % (07/15 0700) Arterial Line BP: (93-136)/(34-57) 120/47 (07/15 0710) Weight:  [67.5 kg] 67.5 kg (07/15 0545)  Filed Weights   01/17/20 0606 01/18/20 0459 01/19/20 0545  Weight: 57.4 kg 66.8 kg 67.5 kg    Weight change: 0.731 kg   Hemodynamic parameters for last 24 hours: PAP: (30-35)/(12-15) 31/12  Intake/Output from previous day: 07/14 0701 - 07/15 0700 In: 1702.4 [P.O.:180; I.V.:1190.1; IV Piggyback:332.2] Out: 5361 [Urine:2535; Chest Tube:640]  Intake/Output this shift: Total I/O In: 4.4 [I.V.:4.4] Out: -   Current Meds: Scheduled Meds: . acetaminophen  1,000 mg Oral Q6H  . aspirin EC  81 mg Oral Daily  . bisacodyl  10 mg Oral Daily   Or  . bisacodyl  10 mg Rectal Daily  . Chlorhexidine Gluconate Cloth  6 each Topical Daily  . clonazePAM  0.5 mg Oral BID  . docusate sodium  200 mg Oral Daily  . enoxaparin (LOVENOX) injection  30 mg Subcutaneous QHS  . fentaNYL  1 patch Transdermal Q72H  . insulin aspart  0-24 Units Subcutaneous Q4H  . magnesium oxide  200 mg Oral Daily  . mouth rinse  15 mL Mouth Rinse  BID  . metoCLOPramide (REGLAN) injection  10 mg Intravenous Q6H  . pantoprazole  40 mg Oral Daily  . sodium chloride flush  10-40 mL Intracatheter Q12H  . sodium chloride flush  3 mL Intravenous Q12H  . warfarin  2.5 mg Oral q1600  . Warfarin - Physician Dosing Inpatient   Does not apply q1600   Continuous Infusions: . sodium chloride    . lactated ringers    . lactated ringers 10 mL/hr at 01/19/20 0700  . milrinone 0.2 mcg/kg/min (01/19/20 0700)  . phenylephrine (NEO-SYNEPHRINE) Adult infusion 70 mcg/min (01/19/20 0705)  . potassium chloride 10 mEq (01/19/20 0705)  . potassium chloride     PRN Meds:.cyclobenzaprine, fentaNYL (SUBLIMAZE) injection, gabapentin, metoprolol tartrate, ondansetron (ZOFRAN) IV, oxyCODONE, sodium chloride flush, sodium chloride flush  General appearance: alert, cooperative and no distress Heart: regular rate and rhythm Lungs: diminished breath sounds bibasilar Abdomen: soft, non-tender; bowel sounds normal; no masses,  no organomegaly Extremities: edema trace Wound: aquacel remains in place  Lab Results: CBC: Recent Labs    01/18/20 1742 01/19/20 0409  WBC 11.1* 10.8*  HGB 8.1* 7.4*  HCT 25.0* 23.0*  PLT 218 191   BMET:  Recent Labs    01/18/20 1742 01/19/20 0409  NA 143 141  K 3.9 3.5  CL 111 108  CO2 24 25  GLUCOSE 132* 116*  BUN 12 12  CREATININE 0.53 0.69  CALCIUM 6.7* 6.5*    CMET: Lab Results  Component Value Date   WBC 10.8 (H) 01/19/2020   HGB 7.4 (L) 01/19/2020   HCT 23.0 (L) 01/19/2020   PLT 191 01/19/2020   GLUCOSE 116 (H) 01/19/2020   CHOL 237 (H) 09/23/2017   TRIG 101.0 09/23/2017   HDL 100.90 09/23/2017   LDLDIRECT 136.5 06/08/2013   LDLCALC 116 (H) 09/23/2017   ALT 20 01/13/2020   AST 27 01/13/2020   NA 141 01/19/2020   K 3.5 01/19/2020   CL 108 01/19/2020   CREATININE 0.69 01/19/2020   BUN 12 01/19/2020   CO2 25 01/19/2020   TSH 2.61 09/08/2019   INR 1.5 (H) 01/19/2020   HGBA1C 5.3 01/13/2020       PT/INR:  Recent Labs    01/19/20 0409  LABPROT 17.4*  INR 1.5*   Radiology: No results found.   Assessment/Plan: S/P Procedure(s) (LRB): MINIMALLY INVASIVE MITRAL VALVE REPAIR (MVR) USING MEMO 4D 32MM (Right) MINIMALLY INVASIVE MAZE PROCEDURE (N/A) TRANSESOPHAGEAL ECHOCARDIOGRAM (TEE) (N/A)  1. CV- NSR, BP remains labile on Neo-synephrine, Mirlinone.. wean as hemodynamics able 2. INR at 1.5, continue low dose coumadin for MV Repair 3. Pulm- CT output 640 cc, leave in place today... CXR is free from pneumothorax, some atelectasis 4. Renal- creatinine WNL, K is low, will supplement 5. Expected post operative blood loss anemia, Hgb at 7.4, blood has been ordered, unit is transfusing 6. Chronic Back Pain- on Fentanyl patch, IV Fentanyl push, and oxycodone 7. CBGs controlled, continue SSIP 8. Dispo- patient stable, wean Neo and Milrinone as tolerated, INR at 1.5, continue coumadin for MV Repair, supplement K, transfuse for low hemoglobin, pain control remains an issue on fentanyl patch, IV fentanyl and oxycodone, pain should improve with mobility and chest tubes being removed, continue current care     Ellwood Handler, PA-C 01/19/2020 7:59 AM    I have seen and examined the patient and agree with the assessment and plan as outlined.  Chronic anxiety and pain understandably exacerbated by surgery.  Otherwise doing remarkably well clinically.  Will transfuse for expected acute blood loss anemia.  Mobilize as much as possible for expected post op atelectasis.  Continue diuresis for expected post op volume excess.  Maintaining NSR/AAI paced rhythm.  Restart Tikosyn.   Rexene Alberts, MD 01/19/2020 8:27 AM

## 2020-01-19 NOTE — Progress Notes (Signed)
CRITICAL VALUE ALERT  Critical Value:  Ca++ 6.4  Date & Time Notied:  01/19/20 1655   Provider Notified: Roxy Manns MD    Orders Received/Actions taken: No new orders   Kathleene Hazel RN

## 2020-01-19 NOTE — Progress Notes (Signed)
Echo order placed.

## 2020-01-19 NOTE — Progress Notes (Signed)
CTS  Was up in chair today Stable BP with paced HR  Blood pressure (!) 130/55, pulse 79, temperature 97.7 F (36.5 C), temperature source Oral, resp. rate (!) 28, height 5\' 6"  (1.676 m), weight 67.5 kg, SpO2 95 %.

## 2020-01-19 NOTE — Progress Notes (Addendum)
Pharmacy: Dofetilide (Tikosyn) - Initial Consult Assessment and Electrolyte Replacement  Pharmacy consulted to assist in monitoring and replacing electrolytes in this 74 y.o. female admitted on 01/17/2020 undergoing dofetilide re-initiation. First dofetilide dose: 250 mcg  Labs:    Component Value Date/Time   K 4.2 01/19/2020 1535   MG 2.3 01/19/2020 1535     Plan: Potassium: K >/= 4: Appropriate to initiate Tikosyn, no replacement needed  ; however, patient to be on Lasix 20mg  IV Q6H x3 doses so will give KCL 37mEq PO x 1 tonight  Magnesium: Mg >2: Appropriate to initiate Tikosyn, no replacement needed     Thank you for allowing pharmacy to participate in this patient's care   Toney Lizaola D. Mina Marble, PharmD, BCPS, [place] 01/19/2020, 5:40 PM

## 2020-01-19 NOTE — Progress Notes (Signed)
   01/19/20 1100  Clinical Encounter Type  Visited With Patient and family together  Visit Type Follow-up  Spiritual Encounters  Spiritual Needs Prayer;Emotional   Chaplain engaged in follow-up visit with [name] and her sister, [name].  [name] voiced still feeling a lot of pain.  While offering the ministries of presence and listening, chaplain also worked to offer [name] some celebrations in the midst of that pain.  [name] has voiced wanted to live and get better and not be in so much pain.  Chaplain affirmed those words. [name] and [name] prayed together uplifting [name]'s requests.

## 2020-01-19 NOTE — Progress Notes (Signed)
Pharmacy: Dofetilide (Tikosyn) - Initial Consult Assessment and Electrolyte Replacement  Pharmacy consulted to assist in monitoring and replacing electrolytes in this 74 y.o. female admitted on 01/17/2020 undergoing dofetilide re-initiation. First dofetilide dose: 250 mcg  Assessment:  Patient Exclusion Criteria: If any screening criteria checked as "Yes", then  patient  should NOT receive dofetilide until criteria item is corrected.  If "Yes" please indicate correction plan.  YES  NO Patient  Exclusion Criteria Correction Plan   []   [x]   Baseline QTc interval is greater than or equal to 440 msec. IF above YES box checked dofetilide contraindicated unless patient has ICD; then may proceed if QTc 500-550 msec or with known ventricular conduction abnormalities may proceed with QTc 550-600 msec. QTc = 385 msec    []   [x]   Patient is known or suspected to have a digoxin level greater than 2 ng/ml: No results found for: DIGOXIN     []   [x]   Creatinine clearance less than 20 ml/min (calculated using Cockcroft-Gault, actual body weight and serum creatinine): Estimated Creatinine Clearance: 56.9 mL/min (by C-G formula based on SCr of 0.69 mg/dL).     [x]   []  Patient has received drugs known to prolong the QT intervals within the last 48 hours (phenothiazines, tricyclics or tetracyclic antidepressants, erythromycin, H-1 antihistamines, cisapride, fluoroquinolones, azithromycin). Drugs not listed above may have an, as yet, undetected potential to prolong the QT interval, updated information on QT prolonging agents is available at this website:QT prolonging agents or www.crediblemeds.org Reglan stopping prior to Tikosyn initiation given QTc prolongation risk.   []   [x]   Patient received a dose of hydrochlorothiazide (Oretic) alone or in any combination including triamterene (Dyazide, Maxzide) in the last 48 hours.    []   [x]  Patient received a medication known to increase dofetilide plasma  concentrations prior to initial dofetilide dose:  . Trimethoprim (Primsol, Proloprim) in the last 36 hours . Verapamil (Calan, Verelan) in the last 36 hours or a sustained release dose in the last 72 hours . Megestrol (Megace) in the last 5 days  . Cimetidine (Tagamet) in the last 6 hours . Ketoconazole (Nizoral) in the last 24 hours . Itraconazole (Sporanox) in the last 48 hours  . Prochlorperazine (Compazine) in the last 36 hours     []   [x]   Patient is known to have a history of torsades de pointes; congenital or acquired long QT syndromes.    []   [x]   Patient has received a Class 1 antiarrhythmic with less than 2 half-lives since last dose. (Disopyramide, Quinidine, Procainamide, Lidocaine, Mexiletine, Flecainide, Propafenone)    []   [x]   Patient has received amiodarone therapy in the past 3 months or amiodarone level is greater than 0.3 ng/ml.    Patient has been appropriately anticoagulated with warfarin.  Labs:    Component Value Date/Time   K 3.5 01/19/2020 0409   MG 2.4 01/18/2020 1742     Plan: Potassium: Plan for recheck of potassium  Magnesium: Plan for recheck of magnesium   Thank you for allowing pharmacy to participate in this patient's care   Richardine Service, PharmD PGY2 Cardiology Pharmacy Resident Phone: [phone] 01/19/2020  3:33 PM  Please check [email or web address] for unit-specific pharmacy phone numbers.

## 2020-01-20 ENCOUNTER — Inpatient Hospital Stay (HOSPITAL_COMMUNITY): Payer: Medicare Other

## 2020-01-20 LAB — PROTIME-INR
INR: 1.7 — ABNORMAL HIGH (ref 0.8–1.2)
Prothrombin Time: 19.7 seconds — ABNORMAL HIGH (ref 11.4–15.2)

## 2020-01-20 LAB — COOXEMETRY PANEL
Carboxyhemoglobin: 1.6 % — ABNORMAL HIGH (ref 0.5–1.5)
Methemoglobin: 1.4 % (ref 0.0–1.5)
O2 Saturation: 76.5 %
Total hemoglobin: 8.6 g/dL — ABNORMAL LOW (ref 12.0–16.0)

## 2020-01-20 LAB — CBC
HCT: 27.7 % — ABNORMAL LOW (ref 36.0–46.0)
Hemoglobin: 8.6 g/dL — ABNORMAL LOW (ref 12.0–15.0)
MCH: 26.8 pg (ref 26.0–34.0)
MCHC: 31 g/dL (ref 30.0–36.0)
MCV: 86.3 fL (ref 80.0–100.0)
Platelets: 165 10*3/uL (ref 150–400)
RBC: 3.21 MIL/uL — ABNORMAL LOW (ref 3.87–5.11)
RDW: 22.6 % — ABNORMAL HIGH (ref 11.5–15.5)
WBC: 9.3 10*3/uL (ref 4.0–10.5)
nRBC: 0 % (ref 0.0–0.2)

## 2020-01-20 LAB — GLUCOSE, CAPILLARY
Glucose-Capillary: 105 mg/dL — ABNORMAL HIGH (ref 70–99)
Glucose-Capillary: 112 mg/dL — ABNORMAL HIGH (ref 70–99)
Glucose-Capillary: 112 mg/dL — ABNORMAL HIGH (ref 70–99)
Glucose-Capillary: 130 mg/dL — ABNORMAL HIGH (ref 70–99)
Glucose-Capillary: 80 mg/dL (ref 70–99)
Glucose-Capillary: 90 mg/dL (ref 70–99)
Glucose-Capillary: 98 mg/dL (ref 70–99)

## 2020-01-20 LAB — BASIC METABOLIC PANEL
Anion gap: 8 (ref 5–15)
BUN: 17 mg/dL (ref 8–23)
CO2: 24 mmol/L (ref 22–32)
Calcium: 7.3 mg/dL — ABNORMAL LOW (ref 8.9–10.3)
Chloride: 107 mmol/L (ref 98–111)
Creatinine, Ser: 0.76 mg/dL (ref 0.44–1.00)
GFR calc Af Amer: >60 mL/min (ref >60)
GFR calc non Af Amer: >60 mL/min (ref >60)
Glucose, Bld: 110 mg/dL — ABNORMAL HIGH (ref 70–99)
Potassium: 4.4 mmol/L (ref 3.5–5.1)
Sodium: 139 mmol/L (ref 135–145)

## 2020-01-20 LAB — MAGNESIUM: Magnesium: 2.4 mg/dL (ref 1.7–2.4)

## 2020-01-20 MED ORDER — POTASSIUM CHLORIDE CRYS ER 20 MEQ PO TBCR
20.0000 meq | EXTENDED_RELEASE_TABLET | Freq: Once | ORAL | Status: AC
  Administered 2020-01-20: 20 meq via ORAL
  Filled 2020-01-20: qty 1

## 2020-01-20 MED ORDER — FUROSEMIDE 10 MG/ML IJ SOLN
20.0000 mg | Freq: Four times a day (QID) | INTRAMUSCULAR | Status: AC
  Administered 2020-01-20 (×3): 20 mg via INTRAVENOUS
  Filled 2020-01-20 (×3): qty 2

## 2020-01-20 MED FILL — Electrolyte-R (PH 7.4) Solution: INTRAVENOUS | Qty: 4000 | Status: AC

## 2020-01-20 MED FILL — Potassium Chloride Inj 2 mEq/ML: INTRAVENOUS | Qty: 20 | Status: AC

## 2020-01-20 MED FILL — Sodium Chloride IV Soln 0.9%: INTRAVENOUS | Qty: 4000 | Status: AC

## 2020-01-20 MED FILL — Calcium Chloride Inj 10%: INTRAVENOUS | Qty: 10 | Status: AC

## 2020-01-20 MED FILL — Albumin, Human Inj 5%: INTRAVENOUS | Qty: 500 | Status: AC

## 2020-01-20 MED FILL — Lidocaine HCl Local Preservative Free (PF) Inj 2%: INTRAMUSCULAR | Qty: 15 | Status: AC

## 2020-01-20 NOTE — Progress Notes (Addendum)
TCTS DAILY ICU PROGRESS NOTE                   [address]            [place], [address]          [phone]   3 Days Post-Op Procedure(s) (LRB): MINIMALLY INVASIVE MITRAL VALVE REPAIR (MVR) USING MEMO 4D 32MM (Right) MINIMALLY INVASIVE MAZE PROCEDURE (N/A) TRANSESOPHAGEAL ECHOCARDIOGRAM (TEE) (N/A)  Total Length of Stay:  LOS: 3 days   Subjective:  Patient up in chair.  Complaining that she wants to be back in bed.  She also states she still feels short of breath and thinks the surgery didn't work.  She has not yet ambulated and per nursing she is reluctant to move around much, however she was like this at baseline.    Objective: Vital signs in last 24 hours: Temp:  [97.5 F (36.4 C)-98.4 F (36.9 C)] 98.4 F (36.9 C) (07/16 0700) Pulse Rate:  [78-92] 82 (07/16 0700) Cardiac Rhythm: A-V Sequential paced (07/16 0400) Resp:  [13-33] 13 (07/16 0700) BP: (94-141)/(45-85) 118/53 (07/16 0700) SpO2:  [80 %-100 %] 100 % (07/16 0700) Arterial Line BP: (92-172)/(44-85) 130/52 (07/16 0700) Weight:  [66 kg] 66 kg (07/16 0500)  Filed Weights   01/18/20 0459 01/19/20 0545 01/20/20 0500  Weight: 66.8 kg 67.5 kg 66 kg    Weight change: -1.547 kg   Intake/Output from previous day: 07/15 0701 - 07/16 0700 In: 985.6 [I.V.:224.2; Blood:315; IV Piggyback:446.4] Out: 1480 [Urine:850; Chest Tube:630]  Current Meds: Scheduled Meds: . acetaminophen  1,000 mg Oral Q6H  . aspirin EC  81 mg Oral Daily  . bisacodyl  10 mg Oral Daily   Or  . bisacodyl  10 mg Rectal Daily  . Chlorhexidine Gluconate Cloth  6 each Topical Daily  . clonazePAM  0.5 mg Oral BID  . docusate sodium  200 mg Oral Daily  . dofetilide  250 mcg Oral BID  . enoxaparin (LOVENOX) injection  30 mg Subcutaneous QHS  . fentaNYL  1 patch Transdermal Q72H  . insulin aspart  0-24 Units Subcutaneous Q4H  . ketorolac  15 mg Intravenous Q6H  . magnesium oxide  200 mg Oral Daily  . mouth rinse  15 mL Mouth  Rinse BID  . pantoprazole  40 mg Oral Daily  . sodium chloride flush  10-40 mL Intracatheter Q12H  . sodium chloride flush  3 mL Intravenous Q12H  . warfarin  2.5 mg Oral q1600  . Warfarin - Physician Dosing Inpatient   Does not apply q1600   Continuous Infusions: . sodium chloride    . lactated ringers    . lactated ringers Stopped (01/20/20 0128)  . milrinone 0.2 mcg/kg/min (01/20/20 0700)  . phenylephrine (NEO-SYNEPHRINE) Adult infusion Stopped (01/19/20 1046)   PRN Meds:.cyclobenzaprine, fentaNYL (SUBLIMAZE) injection, gabapentin, metoprolol tartrate, ondansetron (ZOFRAN) IV, oxyCODONE, sodium chloride flush, sodium chloride flush  General appearance: alert, cooperative and no distress Heart: regular rate and rhythm Lungs: clear to auscultation bilaterally Abdomen: soft, non-tender; bowel sounds normal; no masses,  no organomegaly Extremities: edema trace  Wound: clean and dry  Lab Results: CBC: Recent Labs    01/19/20 1535 01/19/20 1535 01/19/20 2206 01/20/20 0436  WBC 9.9  --   --  9.3  HGB 8.8*   < > 7.8* 8.6*  HCT 28.3*   < > 23.0* 27.7*  PLT 167  --   --  165   < > =  values in this interval not displayed.   BMET:  Recent Labs    01/19/20 1535 01/19/20 1535 01/19/20 2206 01/20/20 0436  NA 140   < > 142 139  K 4.2   < > 4.3 4.4  CL 110   < > 104 107  CO2 24  --   --  24  GLUCOSE 141*   < > 121* 110*  BUN 15   < > 18 17  CREATININE 0.69   < > 0.60 0.76  CALCIUM 6.4*  --   --  7.3*   < > = values in this interval not displayed.    CMET: Lab Results  Component Value Date   WBC 9.3 01/20/2020   HGB 8.6 (L) 01/20/2020   HCT 27.7 (L) 01/20/2020   PLT 165 01/20/2020   GLUCOSE 110 (H) 01/20/2020   CHOL 237 (H) 09/23/2017   TRIG 101.0 09/23/2017   HDL 100.90 09/23/2017   LDLDIRECT 136.5 06/08/2013   LDLCALC 116 (H) 09/23/2017   ALT 20 01/13/2020   AST 27 01/13/2020   NA 139 01/20/2020   K 4.4 01/20/2020   CL 107 01/20/2020   CREATININE 0.76  01/20/2020   BUN 17 01/20/2020   CO2 24 01/20/2020   TSH 2.61 09/08/2019   INR 1.7 (H) 01/20/2020   HGBA1C 5.3 01/13/2020      PT/INR:  Recent Labs    01/20/20 0436  LABPROT 19.7*  INR 1.7*   Radiology: No results found.   Assessment/Plan: S/P Procedure(s) (LRB): MINIMALLY INVASIVE MITRAL VALVE REPAIR (MVR) USING MEMO 4D 32MM (Right) MINIMALLY INVASIVE MAZE PROCEDURE (N/A) TRANSESOPHAGEAL ECHOCARDIOGRAM (TEE) (N/A)  1. CV- off neo, weaning Milrinone as hemodynamics allow, restarted on Tikosyn 2. INR 1.7, continue coumadin for MV Repair 3. Pulm- CT output 630 cc since surgery, will remain in place today, CXR w/o significant pleural effusions, pneumothorax 4. Renal- creatinine WNL, remains volume overloaded, has completed IV lasix doses ordered by Dr. Roxy Manns, K at 4.4 5. Expected post operative blood loss anemia, Hgb up to 8.6 after transfusion 6. Deconditioning- patient wasn't very active at baseline due to extensive back problems/surgeries... this has been exacerbated by surgery, patient will require a lot of motivation to be out of bed and ambulating.. I have placed PT consult to aid with patients debility and recovery, I suspect she will require SNF placement at discharge 7. CBGs remains controlled, she is not a diabetic, will d/c SSIP 8. dispo- patient stable, needs to ambulate have obtained PT consult, weaning milrinone as hemodynamics allow, coumadin for MV repair, continue current care  Ellwood Handler, PA-C  01/20/2020 8:10 AM    I have seen and examined the patient and agree with the assessment and plan as outlined.  Clinically doing quite well.  Maintaining NSR w/ stable BP and breathing comfortably w/ O2 sats 99-100% on 4 L/min.  Needs a tremendous amount of encouragement and reassurance.  Mobilize.  Diuresis.  D/C mediastinal tubes but leave pleural tubes until output decreases further.  D/C Aline, central line and Foley catheter.  PT consult.   Rexene Alberts,  MD 01/20/2020 8:32 AM

## 2020-01-20 NOTE — Progress Notes (Signed)
Pharmacy: Dofetilide (Tikosyn) - Initial Consult Assessment and Electrolyte Replacement  Pharmacy consulted to assist in monitoring and replacing electrolytes in this 74 y.o. female admitted on 01/17/2020 undergoing dofetilide re-initiation. First dofetilide dose: 250 mcg  Labs:    Component Value Date/Time   K 4.4 01/20/2020 0436   MG 2.4 01/20/2020 0436     Plan: Potassium: K >/= 4: Appropriate to initiate Tikosyn, no replacement needed  ; however, patient to be on Lasix 20mg  IV Q6H x3 doses again today, so will give KCL 53mEq PO x 1 today  Magnesium: Mg >2: Appropriate to initiate Tikosyn, no replacement needed     Thank you for allowing pharmacy to participate in this patient's care   Richardine Service, PharmD PGY2 Cardiology Pharmacy Resident Phone: [phone] 01/20/2020  10:16 AM  Please check [email or web address] for unit-specific pharmacy phone numbers.

## 2020-01-20 NOTE — Evaluation (Signed)
Physical Therapy Evaluation Patient Details Name: [name] MRN: [id number] DOB: 01-01-1946 Today's Date: 01/20/2020   History of Present Illness  On 01/17/20 pt underwent minimall invasive MVR, minimally invasive Maze procedure. Then required median sternotomy for repair of lt ventricular free-wall laceration. PMH - mitral regurgitation,afib, severe scoliosis for which she has undergone numerous surgical procedures, chronic pain on long-term oral narcotics, anxiety, osteoporosis, degenerative arthritis, and peripheral neuropathy  Clinical Impression  Pt presents to PT with decr mobility after surgery. Pt motivated to participate with PT and has made arrangements with family for them to provide 24 hour assistance after dc. Expect she will make good progress.     Follow Up Recommendations Home health PT;Supervision/Assistance - 24 hour    Equipment Recommendations  Other (comment) (rollator)    Recommendations for Other Services       Precautions / Restrictions Precautions Precautions: Sternal;Fall      Mobility  Bed Mobility Overal bed mobility: Needs Assistance Bed Mobility: Supine to Sit     Supine to sit: Min assist;+2 for safety/equipment     General bed mobility comments: Assist to elevate trunk into sitting  Transfers Overall transfer level: Needs assistance Equipment used: 4-wheeled walker Transfers: Sit to/from Stand Sit to Stand: Min assist;+2 safety/equipment         General transfer comment: Assist to bring hips up and for stability  Ambulation/Gait Ambulation/Gait assistance: Min assist Gait Distance (Feet): 80 Feet Assistive device: 4-wheeled walker Gait Pattern/deviations: Step-through pattern;Decreased step length - right;Decreased step length - left;Drifts right/left Gait velocity: decr Gait velocity interpretation: <1.31 ft/sec, indicative of household ambulator General Gait Details: Assist for balance.   Stairs            Wheelchair  Mobility    Modified Rankin (Stroke Patients Only)       Balance Overall balance assessment: Needs assistance Sitting-balance support: No upper extremity supported;Feet supported Sitting balance-Leahy Scale: Fair     Standing balance support: Bilateral upper extremity supported Standing balance-Leahy Scale: Poor Standing balance comment: rollator and min guard for static standing                             Pertinent Vitals/Pain Pain Assessment: Faces Faces Pain Scale: Hurts a little bit Pain Location: back, chest Pain Descriptors / Indicators: Grimacing Pain Intervention(s): Limited activity within patient's tolerance    Home Living Family/patient expects to be discharged to:: Private residence Living Arrangements: Alone Available Help at Discharge: Family;Available 24 hours/day Type of Home: House Home Access: Stairs to enter Entrance Stairs-Rails: Right Entrance Stairs-Number of Steps: 3 Home Layout: Two level;Able to live on main level with bedroom/bathroom Home Equipment: Kasandra Knudsen - single point;Other (comment) (walking poles)      Prior Function Level of Independence: Independent               Hand Dominance   Dominant Hand: Right    Extremity/Trunk Assessment   Upper Extremity Assessment Upper Extremity Assessment: Defer to OT evaluation    Lower Extremity Assessment Lower Extremity Assessment: Generalized weakness    Cervical / Trunk Assessment Cervical / Trunk Assessment: Kyphotic;Other exceptions (multiple spinal surgeries for scoliosis)  Communication   Communication: No difficulties  Cognition Arousal/Alertness: Awake/alert Behavior During Therapy: WFL for tasks assessed/performed Overall Cognitive Status: Within Functional Limits for tasks assessed  General Comments General comments (skin integrity, edema, etc.): Pt on 4L of O2. VSS    Exercises     Assessment/Plan     PT Assessment Patient needs continued PT services  PT Problem List Decreased strength;Decreased activity tolerance;Decreased balance;Decreased mobility;Decreased knowledge of precautions       PT Treatment Interventions DME instruction;Gait training;Stair training;Functional mobility training;Therapeutic activities;Therapeutic exercise;Balance training;Patient/family education    PT Goals (Current goals can be found in the Care Plan section)  Acute Rehab PT Goals Patient Stated Goal: return home PT Goal Formulation: With patient Time For Goal Achievement: 02/03/20 Potential to Achieve Goals: Good    Frequency Min 3X/week   Barriers to discharge        Co-evaluation               AM-PAC PT "6 Clicks" Mobility  Outcome Measure Help needed turning from your back to your side while in a flat bed without using bedrails?: A Little Help needed moving from lying on your back to sitting on the side of a flat bed without using bedrails?: A Little Help needed moving to and from a bed to a chair (including a wheelchair)?: A Little Help needed standing up from a chair using your arms (e.g., wheelchair or bedside chair)?: A Little Help needed to walk in hospital room?: A Little Help needed climbing 3-5 steps with a railing? : A Lot 6 Click Score: 17    End of Session Equipment Utilized During Treatment: Gait belt;Oxygen Activity Tolerance: Patient tolerated treatment well Patient left: in chair;with call bell/phone within reach Nurse Communication: Mobility status PT Visit Diagnosis: Other abnormalities of gait and mobility (R26.89);Muscle weakness (generalized) (M62.81)    Time: 1829-9371 PT Time Calculation (min) (ACUTE ONLY): 32 min   Charges:   PT Evaluation $PT Eval Moderate Complexity: 1 Mod PT Treatments $Gait Training: 8-22 mins        [place] Pager [phone] Office Sun 01/20/2020, 4:09  PM

## 2020-01-20 NOTE — Progress Notes (Signed)
TCTS BRIEF SICU PROGRESS NOTE  3 Days Post-Op  S/P Procedure(s) (LRB): MINIMALLY INVASIVE MITRAL VALVE REPAIR (MVR) USING MEMO 4D 32MM (Right) MINIMALLY INVASIVE MAZE PROCEDURE (N/A) TRANSESOPHAGEAL ECHOCARDIOGRAM (TEE) (N/A)   Doing fairly well today.  Ambulated short distance in hall NSR w/ stable BP Breathing comfortably w/ O2 sats 100% Diuresing fairly well  Plan: Continue current plan  Rexene Alberts, MD 01/20/2020 5:27 PM

## 2020-01-21 LAB — BASIC METABOLIC PANEL
Anion gap: 8 (ref 5–15)
BUN: 16 mg/dL (ref 8–23)
CO2: 26 mmol/L (ref 22–32)
Calcium: 6.9 mg/dL — ABNORMAL LOW (ref 8.9–10.3)
Chloride: 109 mmol/L (ref 98–111)
Creatinine, Ser: 0.68 mg/dL (ref 0.44–1.00)
GFR calc Af Amer: >60 mL/min (ref >60)
GFR calc non Af Amer: >60 mL/min (ref >60)
Glucose, Bld: 91 mg/dL (ref 70–99)
Potassium: 4 mmol/L (ref 3.5–5.1)
Sodium: 143 mmol/L (ref 135–145)

## 2020-01-21 LAB — TYPE AND SCREEN
ABO/RH(D): B POS
Antibody Screen: NEGATIVE
Unit division: 0
Unit division: 0
Unit division: 0
Unit division: 0
Unit division: 0
Unit division: 0
Unit division: 0
Unit division: 0

## 2020-01-21 LAB — GLUCOSE, CAPILLARY
Glucose-Capillary: 72 mg/dL (ref 70–99)
Glucose-Capillary: 101 mg/dL — ABNORMAL HIGH (ref 70–99)

## 2020-01-21 LAB — CBC
HCT: 26.3 % — ABNORMAL LOW (ref 36.0–46.0)
Hemoglobin: 8.2 g/dL — ABNORMAL LOW (ref 12.0–15.0)
MCH: 27.4 pg (ref 26.0–34.0)
MCHC: 31.2 g/dL (ref 30.0–36.0)
MCV: 88 fL (ref 80.0–100.0)
Platelets: 123 10*3/uL — ABNORMAL LOW (ref 150–400)
RBC: 2.99 MIL/uL — ABNORMAL LOW (ref 3.87–5.11)
RDW: 22.4 % — ABNORMAL HIGH (ref 11.5–15.5)
WBC: 5.1 10*3/uL (ref 4.0–10.5)
nRBC: 0 % (ref 0.0–0.2)

## 2020-01-21 LAB — BPAM RBC
Blood Product Expiration Date: 202108122359
Blood Product Expiration Date: 202108142359
Blood Product Expiration Date: 202108162359
Blood Product Expiration Date: 202108172359
Blood Product Expiration Date: 202108172359
Blood Product Expiration Date: 202108172359
Blood Product Expiration Date: 202108172359
Blood Product Expiration Date: 202108172359
ISSUE DATE / TIME: 202107131015
ISSUE DATE / TIME: 202107131015
ISSUE DATE / TIME: 202107131345
ISSUE DATE / TIME: 202107131929
ISSUE DATE / TIME: 202107131929
ISSUE DATE / TIME: 202107150629
Unit Type and Rh: 7300
Unit Type and Rh: 7300
Unit Type and Rh: 7300
Unit Type and Rh: 7300
Unit Type and Rh: 7300
Unit Type and Rh: 7300
Unit Type and Rh: 7300
Unit Type and Rh: 7300

## 2020-01-21 LAB — PROTIME-INR
INR: 2.4 — ABNORMAL HIGH (ref 0.8–1.2)
Prothrombin Time: 25.3 seconds — ABNORMAL HIGH (ref 11.4–15.2)

## 2020-01-21 LAB — MAGNESIUM: Magnesium: 2 mg/dL (ref 1.7–2.4)

## 2020-01-21 MED ORDER — FE FUMARATE-B12-VIT C-FA-IFC PO CAPS
1.0000 | ORAL_CAPSULE | Freq: Every day | ORAL | Status: DC
  Administered 2020-01-21 – 2020-02-02 (×13): 1 via ORAL
  Filled 2020-01-21 (×13): qty 1

## 2020-01-21 MED ORDER — POTASSIUM CHLORIDE CRYS ER 20 MEQ PO TBCR
40.0000 meq | EXTENDED_RELEASE_TABLET | Freq: Once | ORAL | Status: AC
  Administered 2020-01-21: 40 meq via ORAL
  Filled 2020-01-21: qty 2

## 2020-01-21 MED ORDER — MAGNESIUM SULFATE 2 GM/50ML IV SOLN
2.0000 g | Freq: Once | INTRAVENOUS | Status: AC
  Administered 2020-01-21: 2 g via INTRAVENOUS
  Filled 2020-01-21: qty 50

## 2020-01-21 MED ORDER — FUROSEMIDE 10 MG/ML IJ SOLN
40.0000 mg | Freq: Two times a day (BID) | INTRAMUSCULAR | Status: AC
  Administered 2020-01-21 – 2020-01-22 (×4): 40 mg via INTRAVENOUS
  Filled 2020-01-21 (×4): qty 4

## 2020-01-21 NOTE — Progress Notes (Signed)
TCTS BRIEF SICU PROGRESS NOTE  4 Days Post-Op  S/P Procedure(s) (LRB): MINIMALLY INVASIVE MITRAL VALVE REPAIR (MVR) USING MEMO 4D 32MM (Right) MINIMALLY INVASIVE MAZE PROCEDURE (N/A) TRANSESOPHAGEAL ECHOCARDIOGRAM (TEE) (N/A)   Stable day Did well w/ ambulation  Plan: Continue current plan  Rexene Alberts, MD 01/21/2020 5:28 PM

## 2020-01-21 NOTE — Progress Notes (Signed)
Pharmacy: Dofetilide (Tikosyn) - Initial Consult Assessment and Electrolyte Replacement  Pharmacy consulted to assist in monitoring and replacing electrolytes in this 74 y.o. female admitted on 01/17/2020 undergoing dofetilide re-initiation. First dofetilide dose: 250 mcg  Labs:    Component Value Date/Time   K 4.0 01/21/2020 0304   MG 2.0 01/21/2020 0304     Plan: Potassium: K >/= 4: Appropriate to initiate Tikosyn, no replacement needed  ; however, patient to be on Lasix 40mg  IV BID today, so will give KCL 77mEq PO x 1 today  Magnesium: Mg 1.8-2: Give Mg 2 gm IV x1 to prevent Mg from dropping below 1.8 - do not need to recheck Mg. Appropriate to initiate Tikosyn   Thank you for allowing pharmacy to participate in this patient's care   Richardine Service, PharmD PGY2 Cardiology Pharmacy Resident Phone: [phone] 01/21/2020  10:15 AM  Please check [email or web address] for unit-specific pharmacy phone numbers.

## 2020-01-21 NOTE — Progress Notes (Signed)
      [address]       [place] [address]             [phone]        CARDIOTHORACIC SURGERY PROGRESS NOTE   R4 Days Post-Op Procedure(s) (LRB): MINIMALLY INVASIVE MITRAL VALVE REPAIR (MVR) USING MEMO 4D 32MM (Right) MINIMALLY INVASIVE MAZE PROCEDURE (N/A) TRANSESOPHAGEAL ECHOCARDIOGRAM (TEE) (N/A)  Subjective: Actually reports feeling better.  Much more comfortable.  Sitting up eating lunch  Objective: Vital signs: BP Readings from Last 1 Encounters:  01/21/20 (!) 100/52   Pulse Readings from Last 1 Encounters:  01/21/20 81   Resp Readings from Last 1 Encounters:  01/21/20 (!) 27   Temp Readings from Last 1 Encounters:  01/21/20 98.1 F (36.7 C)    Hemodynamics:    Physical Exam:  Rhythm:   sinus  Breath sounds: clear  Heart sounds:  RRR w/out murmur  Incisions:  Clean and dry  Abdomen:  Soft, non-distended, non-tender  Extremities:  Warm, well-perfused  Chest tubes:  low volume thin serosanguinous output, no air leak    Intake/Output from previous day: 07/16 0701 - 07/17 0700 In: 484.8 [P.O.:480; I.V.:4.8] Out: 2485 [Urine:2175; Chest Tube:310] Intake/Output this shift: Total I/O In: 120 [P.O.:120] Out: 290 [Urine:120; Chest Tube:170]  Lab Results:  CBC: Recent Labs    01/20/20 0436 01/21/20 0304  WBC 9.3 5.1  HGB 8.6* 8.2*  HCT 27.7* 26.3*  PLT 165 123*    BMET:  Recent Labs    01/20/20 0436 01/21/20 0304  NA 139 143  K 4.4 4.0  CL 107 109  CO2 24 26  GLUCOSE 110* 91  BUN 17 16  CREATININE 0.76 0.68  CALCIUM 7.3* 6.9*     PT/INR:   Recent Labs    01/21/20 0304  LABPROT 25.3*  INR 2.4*    CBG (last 3)  Recent Labs    01/20/20 2312 01/21/20 0308 01/21/20 0637  GLUCAP 105* 72 101*    ABG    Component Value Date/Time   PHART 7.235 (L) 01/18/2020 0106   PCO2ART 51.0 (H) 01/18/2020 0106   PO2ART 80 (L) 01/18/2020 0106   HCO3 21.7 01/18/2020 0106   TCO2 27 01/19/2020 2206   ACIDBASEDEF 5.0  (H) 01/18/2020 0106   O2SAT 76.5 01/20/2020 0436    CXR: n/a  Assessment/Plan: S/P Procedure(s) (LRB): MINIMALLY INVASIVE MITRAL VALVE REPAIR (MVR) USING MEMO 4D 32MM (Right) MINIMALLY INVASIVE MAZE PROCEDURE (N/A) TRANSESOPHAGEAL ECHOCARDIOGRAM (TEE) (N/A)  Doing well POD4 Maintaining NSR w/ stable BP Breathing comfortably on room air Appetite improving Expected post op acute blood loss anemia, stable Expected post op volume excess, weight down but still reportedly 7 kg > preop, UOP adequate Expected post op atelectasis, mild   D/C Pleural Tubes  Mobilize, PT  Diuresis  Hold Coumadin tonight  Transfer 4E  Rexene Alberts, MD 01/21/2020 11:28 AM

## 2020-01-21 NOTE — Progress Notes (Signed)
Patient placed back in bed at 0900. Pacing wires removed. Tips intact and patient declined any discomfort. Remained on bedrest for two hours. q31min VS stable. Only 40mL of CT drainage in 2 hours. CT then removed. Vaseline and gauze dressing applied. q54min VS stable. See flowsheet for details.

## 2020-01-22 ENCOUNTER — Inpatient Hospital Stay (HOSPITAL_COMMUNITY): Payer: Medicare Other

## 2020-01-22 LAB — BASIC METABOLIC PANEL
Anion gap: 9 (ref 5–15)
BUN: 15 mg/dL (ref 8–23)
CO2: 29 mmol/L (ref 22–32)
Calcium: 7.4 mg/dL — ABNORMAL LOW (ref 8.9–10.3)
Chloride: 102 mmol/L (ref 98–111)
Creatinine, Ser: 0.56 mg/dL (ref 0.44–1.00)
GFR calc Af Amer: >60 mL/min (ref >60)
GFR calc non Af Amer: >60 mL/min (ref >60)
Glucose, Bld: 136 mg/dL — ABNORMAL HIGH (ref 70–99)
Potassium: 5.1 mmol/L (ref 3.5–5.1)
Sodium: 140 mmol/L (ref 135–145)

## 2020-01-22 LAB — PROTIME-INR
INR: 1.7 — ABNORMAL HIGH (ref 0.8–1.2)
Prothrombin Time: 19.4 seconds — ABNORMAL HIGH (ref 11.4–15.2)

## 2020-01-22 LAB — MAGNESIUM: Magnesium: 2.5 mg/dL — ABNORMAL HIGH (ref 1.7–2.4)

## 2020-01-22 MED ORDER — MAGIC MOUTHWASH
5.0000 mL | Freq: Three times a day (TID) | ORAL | Status: DC | PRN
  Administered 2020-01-22: 5 mL via ORAL
  Filled 2020-01-22 (×2): qty 5

## 2020-01-22 MED ORDER — WARFARIN SODIUM 2 MG PO TABS
2.0000 mg | ORAL_TABLET | Freq: Every day | ORAL | Status: DC
  Administered 2020-01-22: 2 mg via ORAL
  Filled 2020-01-22: qty 1

## 2020-01-22 MED ORDER — MENTHOL 3 MG MT LOZG
1.0000 | LOZENGE | OROMUCOSAL | Status: DC | PRN
  Administered 2020-01-25: 3 mg via ORAL
  Filled 2020-01-22: qty 9

## 2020-01-22 NOTE — Evaluation (Signed)
Occupational Therapy Evaluation Patient Details Name: [name] MRN: [id number] DOB: 02/06/1946 Today's Date: 01/22/2020    History of Present Illness On 01/17/20 pt underwent minimall invasive MVR, minimally invasive Maze procedure. Then required median sternotomy for repair of lt ventricular free-wall laceration. PMH - mitral regurgitation,afib, severe scoliosis for which she has undergone numerous surgical procedures, chronic pain on long-term oral narcotics, anxiety, osteoporosis, degenerative arthritis, and peripheral neuropathy   Clinical Impression   Pt PTA: Pt was independent prior and often tearful when talking about her current status. Pt currently limited by decreased activity tolerance, decreased strength, decreased ability to care for self and decreased knowledge of precautions. Pt performing standing at sink ~3 mins before requiring seated rest break. Pt reported dizziness in sitting/standing: Pt with +orthostatic BP: sitting 134/54 at 93% O2; standing BP:101/64 111 BPM and >90% O2. Pt on 1 L O2 ranging from 88-90%; pt left on 1L O2 >94% at rest. Pt requiring cues for sternal precautions. Pt would greatly benefit from continued OT skilled services for ADL, mobility and safety. OT following acutely.     Follow Up Recommendations  Home health OT;Supervision/Assistance - 24 hour (If pt does not improve, SNF would be appropriate)    Equipment Recommendations  3 in 1 bedside commode    Recommendations for Other Services       Precautions / Restrictions Precautions Precautions: Sternal;Fall Precaution Comments: verbal discussion of sternal precautions; requiring cues Restrictions Weight Bearing Restrictions: Yes Other Position/Activity Restrictions: sternal precautions      Mobility Bed Mobility Overal bed mobility: Needs Assistance Bed Mobility: Supine to Sit     Supine to sit: Min assist     General bed mobility comments: Assist to elevate trunk into  sitting  Transfers Overall transfer level: Needs assistance Equipment used: Rolling walker (2 wheeled) Transfers: Sit to/from Stand Sit to Stand: Min assist;From elevated surface         General transfer comment: Assist to bring hips up and for stability    Balance Overall balance assessment: Needs assistance Sitting-balance support: No upper extremity supported;Feet supported Sitting balance-Leahy Scale: Fair     Standing balance support: Bilateral upper extremity supported Standing balance-Leahy Scale: Poor Standing balance comment: pt use of support for light grooming at sink                           ADL either performed or assessed with clinical judgement   ADL Overall ADL's : Needs assistance/impaired Eating/Feeding: Set up;Sitting;Bed level   Grooming: Min guard;Standing Grooming Details (indicate cue type and reason): ~3 mins at sink before requiring seated rest break Upper Body Bathing: Minimal assistance;Standing   Lower Body Bathing: Moderate assistance;Sitting/lateral leans;Sit to/from stand   Upper Body Dressing : Minimal assistance;Standing   Lower Body Dressing: Moderate assistance;Cueing for safety;Sitting/lateral leans;Sit to/from stand   Toilet Transfer: Minimal assistance;Ambulation;RW   Toileting- Clothing Manipulation and Hygiene: Minimal assistance;Sitting/lateral lean;Sit to/from stand;With adaptive equipment Toileting - Clothing Manipulation Details (indicate cue type and reason): unable to let go of RW      Functional mobility during ADLs: Minimal assistance;Cueing for safety;Rolling walker General ADL Comments: Pt limited by decreased activity tolerance, decreased strength, decreased ability to care for self and decreased knowledge of precautions.     Vision Baseline Vision/History: Wears glasses Wears Glasses: At all times Patient Visual Report: No change from baseline Vision Assessment?: No apparent visual deficits      Perception  Praxis      Pertinent Vitals/Pain Pain Assessment: Faces Faces Pain Scale: Hurts a little bit Pain Location: back, chest Pain Descriptors / Indicators: Grimacing     Hand Dominance Right   Extremity/Trunk Assessment Upper Extremity Assessment Upper Extremity Assessment: Generalized weakness   Lower Extremity Assessment Lower Extremity Assessment: Defer to PT evaluation;Generalized weakness   Cervical / Trunk Assessment Cervical / Trunk Assessment: Kyphotic;Other exceptions (multiple spinal surgeries for scoliosis) Cervical / Trunk Exceptions: multiple spinal surgeris   Communication Communication Communication: No difficulties   Cognition Arousal/Alertness: Awake/alert Behavior During Therapy: WFL for tasks assessed/performed Overall Cognitive Status: Within Functional Limits for tasks assessed                                     General Comments  Pt with +orthostatic BP: sitting 134/54 at 93% O2; standing BP:101/64 111 BPM and >90% O2. Pt on 1 L O2 ranging from 88-90%; pt left on 1L O2 >94% at rest.    Exercises     Shoulder Instructions      Home Living Family/patient expects to be discharged to:: Private residence Living Arrangements: Alone Available Help at Discharge: Family;Available 24 hours/day Type of Home: House Home Access: Stairs to enter [employer] of Steps: 3 Entrance Stairs-Rails: Right Home Layout: Two level;Able to live on main level with bedroom/bathroom     Bathroom Shower/Tub: Teacher, early years/pre: Standard     Home Equipment: Cane - single point;Other (comment) (walking poles)          Prior Functioning/Environment Level of Independence: Independent                 OT Problem List: Decreased strength;Decreased range of motion;Decreased activity tolerance;Impaired balance (sitting and/or standing);Decreased safety awareness;Pain;Increased edema;Impaired UE functional  use;Decreased knowledge of use of DME or AE;Cardiopulmonary status limiting activity;Decreased knowledge of precautions      OT Treatment/Interventions: Self-care/ADL training;Therapeutic exercise;Energy conservation;Manual therapy;Therapeutic activities;Patient/family education;Balance training;DME and/or AE instruction    OT Goals(Current goals can be found in the care plan section) Acute Rehab OT Goals Patient Stated Goal: return home OT Goal Formulation: With patient Time For Goal Achievement: 02/05/20 Potential to Achieve Goals: Good ADL Goals Pt Will Perform Lower Body Dressing: (P) with supervision;sitting/lateral leans;sit to/from stand Pt Will Transfer to Toilet: (P) with supervision;ambulating;bedside commode Pt/caregiver will Perform Home Exercise Program: (P) Increased strength;Both right and left upper extremity Additional ADL Goal #1: (P) Pt will state sternal precautions with no verbal cues to assist in order to ensure safety for d/c home. Additional ADL Goal #2: (P) Pt will increase to supervisionA for OOB ADL tasks with O2 sats >90%.  OT Frequency: Min 2X/week   Barriers to D/C:            Co-evaluation              AM-PAC OT "6 Clicks" Daily Activity     Outcome Measure Help from another person eating meals?: None Help from another person taking care of personal grooming?: A Little Help from another person toileting, which includes using toliet, bedpan, or urinal?: A Lot Help from another person bathing (including washing, rinsing, drying)?: A Lot Help from another person to put on and taking off regular upper body clothing?: A Little Help from another person to put on and taking off regular lower body clothing?: A Lot 6 Click Score: 16   End  of Session Equipment Utilized During Treatment: Gait belt;Rolling walker;Oxygen Nurse Communication: Mobility status  Activity Tolerance: Patient limited by fatigue;Patient limited by lethargy;Patient limited by  pain Patient left: in bed;with call bell/phone within reach;with bed alarm set  OT Visit Diagnosis: Unsteadiness on feet (R26.81);Muscle weakness (generalized) (M62.81);Pain Pain - part of body:  (sternum)                Time: 5883-2549 OT Time Calculation (min): 39 min Charges:  OT General Charges $OT Visit: 1 Visit OT Evaluation $OT Eval Moderate Complexity: 1 Mod OT Treatments $Self Care/Home Management : 8-22 mins $Therapeutic Activity: 8-22 mins  Jefferey Pica, OTR/L Acute Rehabilitation Services Pager: [phone] Office: [phone]   Addam Goeller C 01/22/2020, 4:26 PM

## 2020-01-22 NOTE — Progress Notes (Signed)
Mobility Specialist - Progress Note   01/22/20 1434  Mobility  Activity Ambulated in room  Level of Assistance Moderate assist, patient does 50-74%  Assistive Device Front wheel walker  Distance Ambulated (ft) 40 ft  Mobility Response Tolerated well  Mobility performed by Mobility specialist  $Mobility charge 1 Mobility    Pre-mobility: 84 HR, 94% SpO2 During mobility: 104 HR Post-mobility: 92 HR, 94% SpO2  Pt c/o constant pain around her chest incision site that remained unchanged while ambulating. She voiced that she is very fearful of death and began crying about the amount of pain she is in; she did this both before and after walking. RN notified.   Pricilla Handler Mobility Specialist Mobility Specialist Phone: [phone]

## 2020-01-22 NOTE — Progress Notes (Signed)
Pharmacy: Dofetilide (Tikosyn) - Follow Up Consult Assessment and Electrolyte Replacement  Pharmacy consulted to assist in monitoring and replacing electrolytes in this 74 y.o. female admitted on 01/17/2020 undergoing dofetilide re-initiation. First dofetilide dose: 250 mcg  Labs:    Component Value Date/Time   K 5.1 01/22/2020 0841   MG 2.5 (H) 01/22/2020 0841     Plan: Potassium: K >/= 4: Appropriate to initiate Tikosyn, no replacement needed    Magnesium: Mg >2: Appropriate to initiate Tikosyn, no replacement needed     Thank you for allowing pharmacy to participate in this patient's care   Romilda Garret, PharmD PGY1 Acute Care Pharmacy Resident Phone: [phone] 01/22/2020 9:38 AM  Please check [email or web address] for unit specific pharmacy phone numbers.

## 2020-01-22 NOTE — Progress Notes (Addendum)
      [address]       [place] [address]             [phone]      5 Days Post-Op Procedure(s) (LRB): MINIMALLY INVASIVE MITRAL VALVE REPAIR (MVR) USING MEMO 4D 32MM (Right) MINIMALLY INVASIVE MAZE PROCEDURE (N/A) TRANSESOPHAGEAL ECHOCARDIOGRAM (TEE) (N/A) Subjective: Tongue is red and beefy this morning. Patient complaining of burning in her mouth.   Objective: Vital signs in last 24 hours: Temp:  [97.6 F (36.4 C)-98.3 F (36.8 C)] 97.6 F (36.4 C) (07/18 0751) Pulse Rate:  [75-100] 100 (07/18 0751) Cardiac Rhythm: Sinus tachycardia (07/18 0857) Resp:  [16-28] 17 (07/18 0751) BP: (94-131)/(40-86) 131/67 (07/18 0751) SpO2:  [91 %-100 %] 98 % (07/18 0751) Weight:  [65.9 kg] 65.9 kg (07/17 2212)     Intake/Output from previous day: 07/17 0701 - 07/18 0700 In: 689.7 [P.O.:360; IV Piggyback:79.7] Out: 6962 [Urine:1620; Chest Tube:200] Intake/Output this shift: No intake/output data recorded.  General appearance: alert, cooperative and no distress Heart: regular rate and rhythm, S1, S2 normal, no murmur, click, rub or gallop Lungs: clear to auscultation bilaterally Abdomen: soft, non-tender; bowel sounds normal; no masses,  no organomegaly Extremities: extremities normal, atraumatic, no cyanosis or edema Wound: clean and dry  Lab Results: Recent Labs    01/20/20 0436 01/21/20 0304  WBC 9.3 5.1  HGB 8.6* 8.2*  HCT 27.7* 26.3*  PLT 165 123*   BMET:  Recent Labs    01/21/20 0304 01/22/20 0841  NA 143 140  K 4.0 5.1  CL 109 102  CO2 26 29  GLUCOSE 91 136*  BUN 16 15  CREATININE 0.68 0.56  CALCIUM 6.9* 7.4*    PT/INR:  Recent Labs    01/22/20 0841  LABPROT 19.4*  INR 1.7*   ABG    Component Value Date/Time   PHART 7.235 (L) 01/18/2020 0106   HCO3 21.7 01/18/2020 0106   TCO2 27 01/19/2020 2206   ACIDBASEDEF 5.0 (H) 01/18/2020 0106   O2SAT 76.5 01/20/2020 0436   CBG (last 3)  Recent Labs    01/20/20 2312 01/21/20 0308  01/21/20 0637  GLUCAP 105* 72 101*    Assessment/Plan: S/P Procedure(s) (LRB): MINIMALLY INVASIVE MITRAL VALVE REPAIR (MVR) USING MEMO 4D 32MM (Right) MINIMALLY INVASIVE MAZE PROCEDURE (N/A) TRANSESOPHAGEAL ECHOCARDIOGRAM (TEE) (N/A)  1. CV-sinus tachycardia. INR 1.7, coumadin held last night, will give her 2mg  tonight. On Tikosyn per pharmacy. 2. Pulm-tolerating 2L [place] with good oxygen support. Continue to wean as able. CXR with small bilateral pleural effusions.  3. Renal-creatinine 0.56, potassium borderline high, will trend.  4. Endo-blood glucose well controlled. 5. H and H 8.2/26.3, expected acute blood loss anemia  Plan: Continue diuresis for fluid overload. Ordered magic mouth wash for burning in her mouth. Red and beefy-could be thrush. Continue post-op care. Working on pain control.    LOS: 5 days    Elgie Collard 01/22/2020   I have seen and examined the patient and agree with the assessment and plan as outlined.  Rexene Alberts, MD 01/22/2020 12:59 PM

## 2020-01-23 LAB — MAGNESIUM: Magnesium: 2.2 mg/dL (ref 1.7–2.4)

## 2020-01-23 LAB — BASIC METABOLIC PANEL
Anion gap: 12 (ref 5–15)
BUN: 12 mg/dL (ref 8–23)
CO2: 27 mmol/L (ref 22–32)
Calcium: 7.4 mg/dL — ABNORMAL LOW (ref 8.9–10.3)
Chloride: 100 mmol/L (ref 98–111)
Creatinine, Ser: 0.7 mg/dL (ref 0.44–1.00)
GFR calc Af Amer: >60 mL/min (ref >60)
GFR calc non Af Amer: >60 mL/min (ref >60)
Glucose, Bld: 102 mg/dL — ABNORMAL HIGH (ref 70–99)
Potassium: 4.4 mmol/L (ref 3.5–5.1)
Sodium: 139 mmol/L (ref 135–145)

## 2020-01-23 LAB — PROTIME-INR
INR: 1.5 — ABNORMAL HIGH (ref 0.8–1.2)
Prothrombin Time: 18 seconds — ABNORMAL HIGH (ref 11.4–15.2)

## 2020-01-23 LAB — CBC
HCT: 35 % — ABNORMAL LOW (ref 36.0–46.0)
Hemoglobin: 10.8 g/dL — ABNORMAL LOW (ref 12.0–15.0)
MCH: 27.6 pg (ref 26.0–34.0)
MCHC: 30.9 g/dL (ref 30.0–36.0)
MCV: 89.3 fL (ref 80.0–100.0)
Platelets: 185 10*3/uL (ref 150–400)
RBC: 3.92 MIL/uL (ref 3.87–5.11)
RDW: 22.2 % — ABNORMAL HIGH (ref 11.5–15.5)
WBC: 8.9 10*3/uL (ref 4.0–10.5)
nRBC: 0 % (ref 0.0–0.2)

## 2020-01-23 MED ORDER — WARFARIN SODIUM 4 MG PO TABS
4.0000 mg | ORAL_TABLET | Freq: Every day | ORAL | Status: DC
  Administered 2020-01-23: 4 mg via ORAL
  Filled 2020-01-23: qty 1

## 2020-01-23 MED ORDER — FUROSEMIDE 10 MG/ML IJ SOLN
40.0000 mg | Freq: Two times a day (BID) | INTRAMUSCULAR | Status: AC
  Administered 2020-01-23 (×2): 40 mg via INTRAVENOUS
  Filled 2020-01-23 (×2): qty 4

## 2020-01-23 MED ORDER — METOPROLOL SUCCINATE ER 25 MG PO TB24
12.5000 mg | ORAL_TABLET | Freq: Two times a day (BID) | ORAL | Status: DC
  Administered 2020-01-23 (×2): 12.5 mg via ORAL
  Filled 2020-01-23 (×2): qty 1

## 2020-01-23 NOTE — Progress Notes (Addendum)
Pt experienced large amount of serosanguious from R side of chest tube incision. Placed new dressing x2 on pt's site. Pt stable. PA notified and aware. Kept changing dressing per PA. Will continue to monitor the pt.   Lavenia Atlas, RN

## 2020-01-23 NOTE — Progress Notes (Addendum)
Mobility Specialist: Progress Note    01/23/20 1642  Mobility  Activity Ambulated in hall  Level of Assistance Contact guard assist, steadying assist  Assistive Device Front wheel walker  Distance Ambulated (ft) 60 ft  Mobility Response Tolerated poorly  Mobility performed by Mobility specialist  $Mobility charge 1 Mobility   Pre-Mobility: 117 HR, 87/73 BP, 92% SpO2 Post-Mobility: 125 HR, 131/69 BP  After returning from walk pt experienced sustained tachycardia w/ HR increasing to 155. Pt's HR is ranging from high 130s to mid 140s while pt is laying in bed. Pt expressed that she felt like something was wrong, nurse notified.   [facility] Mobility Specialist

## 2020-01-23 NOTE — Progress Notes (Signed)
CARDIAC REHAB PHASE I   PRE:  Rate/Rhythm: 107 ST  BP:  Supine: 123/67  Sitting:   Standing:    SaO2: 97%RA  MODE:  Ambulation: 40 ft   POST:  Rate/Rhythm: 125 ST  BP:  Supine: 152/66  Sitting:   Standing:    SaO2: 97%RA 1345-1436 Pt assisted to bathroom and then she walked 40 ft on RA with gait belt use, rolling walker and asst x 1. Emotional support given and encouragement. Pt did not cry. Requested pain medication and I notified RN. Put bed alarm back on. Gave pt ice cream as she did not eat much of her lunch and requested it. NT put purewick back on. Pt has call light.   Graylon Good, RN BSN  01/23/2020 2:31 PM

## 2020-01-23 NOTE — Progress Notes (Addendum)
TCTS BRIEF PROGRESS NOTE  6 Days Post-Op  S/P Procedure(s) (LRB): MINIMALLY INVASIVE MITRAL VALVE REPAIR (MVR) USING MEMO 4D 32MM (Right) MINIMALLY INVASIVE MAZE PROCEDURE (N/A) TRANSESOPHAGEAL ECHOCARDIOGRAM (TEE) (N/A)   Patient developed rapid Afib this afternoon for the first time since surgery BP stable  Plan: Will try IV metoprolol.  If rapid Afib persists will plan IV diltiazem and NOT utilize amiodarone because patient is already on Tikosyn and has been intolerant of amiodarone in the past.  Rexene Alberts, MD 01/23/2020 5:24 PM

## 2020-01-23 NOTE — Progress Notes (Addendum)
[address]       [place] [address]             [phone]      6 Days Post-Op Procedure(s) (LRB): MINIMALLY INVASIVE MITRAL VALVE REPAIR (MVR) USING MEMO 4D 32MM (Right) MINIMALLY INVASIVE MAZE PROCEDURE (N/A) TRANSESOPHAGEAL ECHOCARDIOGRAM (TEE) (N/A) Subjective: C/o of a lot of pain and is very anxious  Objective: Vital signs in last 24 hours: Temp:  [97.6 F (36.4 C)-99 F (37.2 C)] 99 F (37.2 C) (07/19 0727) Pulse Rate:  [98-109] 109 (07/19 0727) Cardiac Rhythm: Sinus tachycardia (07/19 0727) Resp:  [17-29] 19 (07/19 0802) BP: (118-147)/(56-77) 147/64 (07/19 0727) SpO2:  [90 %-96 %] 92 % (07/19 0727)  Hemodynamic parameters for last 24 hours:    Intake/Output from previous day: 07/18 0701 - 07/19 0700 In: 110 [P.O.:110] Out: 250 [Urine:250] Intake/Output this shift: No intake/output data recorded.  General appearance: alert, cooperative and very anxious Heart: regular rate and rhythm and tachy Lungs: min dim in the bases Abdomen: benign Extremities: + edema Wound: incis healing well  Lab Results: Recent Labs    01/21/20 0304  WBC 5.1  HGB 8.2*  HCT 26.3*  PLT 123*   BMET:  Recent Labs    01/21/20 0304 01/22/20 0841  NA 143 140  K 4.0 5.1  CL 109 102  CO2 26 29  GLUCOSE 91 136*  BUN 16 15  CREATININE 0.68 0.56  CALCIUM 6.9* 7.4*    PT/INR:  Recent Labs    01/22/20 0841  LABPROT 19.4*  INR 1.7*   ABG    Component Value Date/Time   PHART 7.235 (L) 01/18/2020 0106   HCO3 21.7 01/18/2020 0106   TCO2 27 01/19/2020 2206   ACIDBASEDEF 5.0 (H) 01/18/2020 0106   O2SAT 76.5 01/20/2020 0436   CBG (last 3)  Recent Labs    01/20/20 2312 01/21/20 0308 01/21/20 0637  GLUCAP 105* 72 101*    Meds Scheduled Meds: . aspirin EC  81 mg Oral Daily  . bisacodyl  10 mg Oral Daily   Or  . bisacodyl  10 mg Rectal Daily  . Chlorhexidine Gluconate Cloth  6 each Topical Daily  . clonazePAM  0.5 mg Oral BID  .  docusate sodium  200 mg Oral Daily  . dofetilide  250 mcg Oral BID  . ferrous [id number] C-folic acid  1 capsule Oral Q breakfast  . magnesium oxide  200 mg Oral Daily  . mouth rinse  15 mL Mouth Rinse BID  . pantoprazole  40 mg Oral Daily  . sodium chloride flush  10-40 mL Intracatheter Q12H  . sodium chloride flush  3 mL Intravenous Q12H  . warfarin  2 mg Oral q1600  . Warfarin - Physician Dosing Inpatient   Does not apply q1600   Continuous Infusions: . sodium chloride    . lactated ringers    . lactated ringers Stopped (01/20/20 0128)   PRN Meds:.cyclobenzaprine, fentaNYL (SUBLIMAZE) injection, gabapentin, magic mouthwash, menthol-cetylpyridinium, metoprolol tartrate, ondansetron (ZOFRAN) IV, oxyCODONE, sodium chloride flush, sodium chloride flush  Xrays DG Chest Port 1 View  Result Date: 01/22/2020 CLINICAL DATA:  Follow-up mitral valve repair. EXAM: PORTABLE CHEST 1 VIEW COMPARISON:  Yesterday. FINDINGS: The previously seen 2 right chest tubes are no longer demonstrated. Small to moderate-sized bilateral pleural effusions with little change. Stable enlarged cardiac silhouette, prosthetic mitral valve, left atrial clip and extensive spinal fixation hardware. No significant change in probable atelectasis at  both lung bases. Diffuse osteopenia and old/subacute right rib fractures. IMPRESSION: Stable small to moderate-sized bilateral pleural effusions with associated bibasilar atelectasis, with little change. Electronically Signed   By: Claudie Revering M.D.   On: 01/22/2020 15:56    Assessment/Plan: S/P Procedure(s) (LRB): MINIMALLY INVASIVE MITRAL VALVE REPAIR (MVR) USING MEMO 4D 32MM (Right) MINIMALLY INVASIVE MAZE PROCEDURE (N/A) TRANSESOPHAGEAL ECHOCARDIOGRAM (TEE) (N/A)   1 doing well overall, anxiety and pain playing a role in recovery, cont current meds but may need to adjust 2 Tachy, conts Tikosyn- anxiousness/pain contributing. BP a bit variable but fairly well  controlled. Check 12 lead 3 sats ok on RA 4 labs pending, just drawn 5 BS well controlled, Hg A1C 5.3 6 rehab and pulm toilet- routine 7 cont IV lasix for volume overload   LOS: 6 days    John Giovanni PA-C Pager [phone] 01/23/2020   I have seen and examined the patient and agree with the assessment and plan as outlined.  Clinically doing well.  Anxiety and issues with chronic pain remain a barrier, but were expected.  Restart home dose beta blocker.  Will need to start looking into plans for hospital d/c  Rexene Alberts, MD 01/23/2020 8:50 AM

## 2020-01-23 NOTE — Progress Notes (Signed)
Pt's rhythm changing and HR>150s.Obtained EKG and placed pt's in the chart. EKG result confirmed Afib with RVR. Pt stable. Notified MD and called rapid response team. Lopressor 5 mg given for pt per MD order. HR came back to <130s. Will keep monitor the pt. MD aware of the pt's changing rhythm.   Lavenia Atlas, RN

## 2020-01-23 NOTE — Significant Event (Signed)
Rapid Response Event Note  Overview: Time Called: 1702 Arrival Time: 1708 Event Type: Cardiac Afib RVR, HR 130-150bpm  Pt currently on Tikosyn and Lopressor  Initial Focused Assessment: Pt lying in bed. Awake, alert. Able to follow commands. JVD noted. Lung sounds are clear, diminished. Heart rate is irregular. Pulses palpable. Pt appears anxious regarding heart rate and states she "doesn't want to die". Reassurance and emotional support provided. Pt endorses chest discomfort, non-radiating. Pt endorses back pain.    VS: 137/79 (93), HR 133, RR 19, SpO2 98% on room air  Interventions: -No intervention from RRRN -Encouraged administration of PRN IV Lopressor as ordered  Plan of Care (if not transferred): -Reassess pt pain following intervention -Continuous telemetry and pulse oximetry monitoring -Notify provider if afib rvr persists  Call rapid response for additional needs  Event Summary: Name of Physician Notified: Dr. Roxy Manns at 1705 (Notified by primary RN) Outcome: Stayed in room and stabalized Event End Time: [place]

## 2020-01-23 NOTE — Progress Notes (Signed)
   01/23/20 1300  Clinical Encounter Type  Visited With Patient  Visit Type Follow-up  Referral From Patient;Nurse  Consult/Referral To Chaplain  Spiritual Encounters  Spiritual Needs Emotional  Stress Factors  Patient Stress Factors Health changes   Chaplain engaged in follow-up visit with [name].  [name] expressed that she is still in a lot of pain but was also able to voice that physicians are stating that she is doing well and getting better.  Chaplain affirmed this progress.  Chaplain worked to [employer] affirm her progress as well to highlight some things worth celebrating.  [name] has also been able to declare: "I want to live.  I want to get better.  I want to go home.  I want to go be with my son."  [name] has urged [name] to keep declaring those words over herself. Chaplain recognizes that [name] begins to worry and then become anxious.  Chaplain is working to help [name] affirm the progress she has made and the good things happening around her.    [name] also shared with chaplain how important it is for her to be surrounded by those that are able to provide care for her that is patient, kind and reassuring.  Chaplain assesses that her anxiety and worries tend to subside when she is spoken to in a way that exudes care and reassurance.    Chaplain will continue to follow-up.

## 2020-01-23 NOTE — Progress Notes (Signed)
Physical Therapy Treatment Patient Details Name: [name] MRN: [id number] DOB: 20-Jan-1946 Today's Date: 01/23/2020    History of Present Illness On 01/17/20 pt underwent minimall invasive MVR, minimally invasive Maze procedure. Then required median sternotomy for repair of lt ventricular free-wall laceration. PMH - mitral regurgitation,afib, severe scoliosis for which she has undergone numerous surgical procedures, chronic pain on long-term oral narcotics, anxiety, osteoporosis, degenerative arthritis, and peripheral neuropathy    PT Comments    Pt in chair stating fatigue and desire to return to bed. Pt able to walk short distance with assist for pericare after toileting. Pt with pacing wire dressing saturated and falling off with pt returned to bed, assist with cleaning and RN present to change dressing and assess pt. PT with HR 130 with limited gait and reports pain and anxiety. Mobility limited due to all above with pt and sister educated for precautions with handout provided. Will continue to follow.     Follow Up Recommendations  Home health PT;Supervision/Assistance - 24 hour     Equipment Recommendations  Other (comment) (rollator)    Recommendations for Other Services       Precautions / Restrictions Precautions Precautions: Sternal;Fall Precaution Booklet Issued: Yes (comment) Precaution Comments: verbal discussion of sternal precautions; requiring cues    Mobility  Bed Mobility   Bed Mobility: Sit to Supine     Supine to sit: Min assist     General bed mobility comments: assist to bring legs to surface, cues for sequence and increased time  Transfers Overall transfer level: Needs assistance   Transfers: Sit to/from Stand Sit to Stand: Min assist         General transfer comment: cues for hand placement and precautions. STanding from chair x 2 and toilet x 1  Ambulation/Gait Ambulation/Gait assistance: Min guard Gait Distance (Feet): 40 Feet Assistive  device: Rolling walker (2 wheeled) Gait Pattern/deviations: Step-through pattern;Decreased stride length;Trunk flexed   Gait velocity interpretation: 1.31 - 2.62 ft/sec, indicative of limited community ambulator General Gait Details: cues for proximity to RW. pt walked 40' then 15'   Stairs             Wheelchair Mobility    Modified Rankin (Stroke Patients Only)       Balance Overall balance assessment: Needs assistance Sitting-balance support: No upper extremity supported Sitting balance-Leahy Scale: Fair     Standing balance support: Bilateral upper extremity supported Standing balance-Leahy Scale: Poor Standing balance comment: UE support in standing                            Cognition Arousal/Alertness: Awake/alert Behavior During Therapy: Flat affect;Anxious Overall Cognitive Status: Impaired/Different from baseline Area of Impairment: Memory                     Memory: Decreased recall of precautions                Exercises      General Comments        Pertinent Vitals/Pain Faces Pain Scale: Hurts whole lot Pain Location: right chest and back Pain Descriptors / Indicators: Guarding;Aching;Constant;Grimacing Pain Intervention(s): Limited activity within patient's tolerance;Monitored during session;Premedicated before session;Patient requesting pain meds-RN notified;Repositioned    Home Living                      Prior Function            PT  Goals (current goals can now be found in the care plan section) Progress towards PT goals: Progressing toward goals    Frequency    Min 3X/week      PT Plan Current plan remains appropriate    Co-evaluation              AM-PAC PT "6 Clicks" Mobility   Outcome Measure  Help needed turning from your back to your side while in a flat bed without using bedrails?: A Little Help needed moving from lying on your back to sitting on the side of a flat bed  without using bedrails?: A Little Help needed moving to and from a bed to a chair (including a wheelchair)?: A Little Help needed standing up from a chair using your arms (e.g., wheelchair or bedside chair)?: A Little Help needed to walk in hospital room?: A Little Help needed climbing 3-5 steps with a railing? : A Lot 6 Click Score: 17    End of Session Equipment Utilized During Treatment: Gait belt Activity Tolerance: Patient limited by pain Patient left: in bed;with call bell/phone within reach;with nursing/sitter in room;with family/visitor present Nurse Communication: Mobility status;Precautions PT Visit Diagnosis: Other abnormalities of gait and mobility (R26.89);Muscle weakness (generalized) (M62.81)     Time: 1164-3539 PT Time Calculation (min) (ACUTE ONLY): 25 min  Charges:  $Gait Training: 8-22 mins $Therapeutic Activity: 8-22 mins                     Audon Heymann P, PT Acute Rehabilitation Services Pager: [phone] Office: [place] 01/23/2020, 9:59 AM

## 2020-01-24 DIAGNOSIS — I480 Paroxysmal atrial fibrillation: Secondary | ICD-10-CM

## 2020-01-24 DIAGNOSIS — Z9889 Other specified postprocedural states: Secondary | ICD-10-CM

## 2020-01-24 LAB — OCCULT BLOOD X 1 CARD TO LAB, STOOL: Fecal Occult Bld: POSITIVE — AB

## 2020-01-24 LAB — CBC
HCT: 32.9 % — ABNORMAL LOW (ref 36.0–46.0)
Hemoglobin: 10 g/dL — ABNORMAL LOW (ref 12.0–15.0)
MCH: 26.6 pg (ref 26.0–34.0)
MCHC: 30.4 g/dL (ref 30.0–36.0)
MCV: 87.5 fL (ref 80.0–100.0)
Platelets: 145 10*3/uL — ABNORMAL LOW (ref 150–400)
RBC: 3.76 MIL/uL — ABNORMAL LOW (ref 3.87–5.11)
RDW: 22.2 % — ABNORMAL HIGH (ref 11.5–15.5)
WBC: 7.7 10*3/uL (ref 4.0–10.5)
nRBC: 0 % (ref 0.0–0.2)

## 2020-01-24 LAB — BASIC METABOLIC PANEL
Anion gap: 11 (ref 5–15)
BUN: 12 mg/dL (ref 8–23)
CO2: 29 mmol/L (ref 22–32)
Calcium: 7.3 mg/dL — ABNORMAL LOW (ref 8.9–10.3)
Chloride: 100 mmol/L (ref 98–111)
Creatinine, Ser: 0.74 mg/dL (ref 0.44–1.00)
GFR calc Af Amer: >60 mL/min (ref >60)
GFR calc non Af Amer: >60 mL/min (ref >60)
Glucose, Bld: 90 mg/dL (ref 70–99)
Potassium: 3.8 mmol/L (ref 3.5–5.1)
Sodium: 140 mmol/L (ref 135–145)

## 2020-01-24 LAB — HEMOGLOBIN AND HEMATOCRIT, BLOOD
HCT: 34.3 % — ABNORMAL LOW (ref 36.0–46.0)
Hemoglobin: 10.9 g/dL — ABNORMAL LOW (ref 12.0–15.0)

## 2020-01-24 LAB — PROTIME-INR
INR: 2.5 — ABNORMAL HIGH (ref 0.8–1.2)
Prothrombin Time: 25.9 seconds — ABNORMAL HIGH (ref 11.4–15.2)

## 2020-01-24 LAB — MAGNESIUM: Magnesium: 2 mg/dL (ref 1.7–2.4)

## 2020-01-24 MED ORDER — METOPROLOL TARTRATE 25 MG PO TABS
25.0000 mg | ORAL_TABLET | Freq: Two times a day (BID) | ORAL | Status: DC
  Administered 2020-01-24 – 2020-01-25 (×4): 25 mg via ORAL
  Filled 2020-01-24 (×4): qty 1

## 2020-01-24 MED ORDER — WARFARIN SODIUM 2 MG PO TABS: 2.0000 mg | ORAL_TABLET | Freq: Every day | ORAL | Status: DC

## 2020-01-24 MED ORDER — POTASSIUM CHLORIDE CRYS ER 20 MEQ PO TBCR
40.0000 meq | EXTENDED_RELEASE_TABLET | Freq: Once | ORAL | Status: AC
  Administered 2020-01-24: 40 meq via ORAL
  Filled 2020-01-24: qty 2

## 2020-01-24 MED ORDER — MAGNESIUM SULFATE 2 GM/50ML IV SOLN
2.0000 g | Freq: Once | INTRAVENOUS | Status: AC
  Administered 2020-01-24: 2 g via INTRAVENOUS
  Filled 2020-01-24: qty 50

## 2020-01-24 MED ORDER — WARFARIN SODIUM 4 MG PO TABS: 4.0000 mg | ORAL_TABLET | Freq: Every day | ORAL | Status: DC

## 2020-01-24 NOTE — Progress Notes (Addendum)
      [address]       [place] [address]             [phone]       Diarrhea this afternoon that is dark with a metallic odor. Will order a stat occult stool to evaluate for blood in her stool. Her INR is 2.5 with 2mg  of coumadin ordered for tonight. If her stool is positive for blood, we would obviously hold her coumadin.    Nicholes Rough, PA-C   Will hold Coumadin tonight.  Stop stool softeners.  Rexene Alberts, MD 01/24/2020 4:48 PM

## 2020-01-24 NOTE — Progress Notes (Signed)
Mobility Specialist: Progress Note   01/24/20 1726  Mobility  Activity Refused mobility  Mobility performed by Mobility specialist   Pt refused mobility and said that she has had a rough Shirline Kendle. Pt c/o CP she rated a 10/10 and said she has been having some stomach issues.   [facility] Asusena Sigley Mobility Specialist

## 2020-01-24 NOTE — Progress Notes (Addendum)
      [address]       [place] [address]             [phone]      7 Days Post-Op Procedure(s) (LRB): MINIMALLY INVASIVE MITRAL VALVE REPAIR (MVR) USING MEMO 4D 32MM (Right) MINIMALLY INVASIVE MAZE PROCEDURE (N/A) TRANSESOPHAGEAL ECHOCARDIOGRAM (TEE) (N/A) Subjective: Only complaint this morning is incisional chest pain.   Objective: Vital signs in last 24 hours: Temp:  [97.6 F (36.4 C)-99.2 F (37.3 C)] 99 F (37.2 C) (07/20 0825) Pulse Rate:  [96-125] 110 (07/20 0825) Cardiac Rhythm: Sinus tachycardia (07/20 0831) Resp:  [20-22] 22 (07/20 0825) BP: (109-137)/(63-89) 118/66 (07/20 0825) SpO2:  [94 %-100 %] 96 % (07/20 0825)     Intake/Output from previous day: 07/19 0701 - 07/20 0700 In: 960 [P.O.:960] Out: 2100 [Urine:2100] Intake/Output this shift: No intake/output data recorded.  General appearance: alert, cooperative and no distress Heart: sinus tachycardia Lungs: clear to auscultation bilaterally Abdomen: soft, non-tender; bowel sounds normal; no masses,  no organomegaly Extremities: extremities normal, atraumatic, no cyanosis or edema Wound: clean and dry  Lab Results: Recent Labs    01/23/20 0825 01/24/20 0356  WBC 8.9 7.7  HGB 10.8* 10.0*  HCT 35.0* 32.9*  PLT 185 145*   BMET:  Recent Labs    01/23/20 0825 01/24/20 0356  NA 139 140  K 4.4 3.8  CL 100 100  CO2 27 29  GLUCOSE 102* 90  BUN 12 12  CREATININE 0.70 0.74  CALCIUM 7.4* 7.3*    PT/INR:  Recent Labs    01/24/20 0356  LABPROT 25.9*  INR 2.5*   ABG    Component Value Date/Time   PHART 7.235 (L) 01/18/2020 0106   HCO3 21.7 01/18/2020 0106   TCO2 27 01/19/2020 2206   ACIDBASEDEF 5.0 (H) 01/18/2020 0106   O2SAT 76.5 01/20/2020 0436   CBG (last 3)  No results for input(s): GLUCAP in the last 72 hours.  Assessment/Plan: S/P Procedure(s) (LRB): MINIMALLY INVASIVE MITRAL VALVE REPAIR (MVR) USING MEMO 4D 32MM (Right) MINIMALLY INVASIVE MAZE  PROCEDURE (N/A) TRANSESOPHAGEAL ECHOCARDIOGRAM (TEE) (N/A)  1. CV- sinus tachycardia, rate 118. BP well controlled. Continue Tikosyn and metoprolol 25mg  BID. No Amio due to intolerance.  2. Pulm- tolerating room air with good oxygen saturation 3. Renal-creatinine 0.74, electrolytes okay Potassium 3.8, Mag 2.0 4. H and H 10.0/32.9, expected acute blood loss anemia 5. Endo-blood glucose well controlled 6. INR bumped to  2.5. On 4mg  of coumadin daily. I'll decrease dose to 2mg  tonight.    Plan: Might be able to increase metoprolol this morning for better HR control. PT saw yesterday and recommended home health PT and 24 hour supervision/assistance. Continue to ambulate in the halls and use incentive spirometer hourly.     LOS: 7 days    Elgie Collard 01/24/2020  I have seen and examined the patient and agree with the assessment and plan as outlined.  Back in NSR and still somewhat elevated HR.  Will increase metoprolol to 25 mg bid.  Rexene Alberts, MD 01/24/2020 1:43 PM

## 2020-01-24 NOTE — Consult Note (Addendum)
ELECTROPHYSIOLOGY CONSULT NOTE    Patient ID: [name] MRN: [id number], DOB/AGE: 75-May-1947 75 y.o.  Admit date: 01/17/2020 Date of Consult: 01/24/2020  Primary Physician: [name], MD Primary Cardiologist: No primary care provider on file.  Electrophysiologist: [name]   Referring Provider: Dr. Roxy Manns  Patient Profile: [name] is a 74 y.o. female with a history of history of mitral regurgitation, recurrent paroxysmal atrial fibrillation on long-term anticoagulation, severe scoliosis for which she has undergone numerous surgical procedures, chronic pain on long-term oral narcotics, anxiety, osteoporosis, degenerative arthritis, GE reflux disease, and peripheral neuropathy seen for atrial fibrillation by request of Dr. Roxy Manns.  HPI:  [name] is a 74 y.o. female with medical history above. Previously followed by [name] for paroxysmal AF and managed on tikosyn and Xarelto.   Pt presented 01/17/2020 for planned minimally invasive mitral valve repair and minimally invasive maze procedure.  Post operative course has gone generally well, but complicated by AF with RVR. EP asked to see for recommendations as she is on tikosyn. She is nearing discharge home.   She has no specific complaint currently, but seems generally overwhelmed with everything that has been going on. She has been up in chair today. She has had some upset stomachs secondary to stool softeners.    Past Medical History:  Diagnosis Date  . Anxiety   . Arthritis    "maybe in my back" (03/31/2018)  . Benign paroxysmal positional vertigo 06/08/2013  . Complication of anesthesia   . Fracture of multiple ribs 2015   "don't know from what; dx'd when I in hospital for 1st back OR" (03/31/2018)  . GERD (gastroesophageal reflux disease)   . Hair loss 04/12/2012  . Herpes   . History of blood transfusion    "twice; related to back OR" (03/31/2018)  . History of kidney stones   . Interstitial cystitis  11/06/2011  . 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 Mitral regurgitation   . Osteopenia 02/18/2012  . Osteoporosis   . PAF (paroxysmal atrial fibrillation) ([place]) 2012  . Peripheral neuropathy 11/06/2011  . PMDD (premenstrual dysphoric disorder)   . PONV (postoperative nausea and vomiting)    nausea, vomiting, hives and dizziness   . 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/2020   Complete bilateral atrial lesion set using cryothermy and bipolar radiofrequency ablation with clipping of LA appendage via right mini-thoracotomy approach  . 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 Seasonal allergies   . Vaginal delivery    ONE NSVD  . 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 ANTERIOR CERVICAL DECOMP/DISCECTOMY FUSION  ~ 2003  . BACK SURGERY    . BREAST SURGERY     BREAST BIOPSY--RIGHT BENIGN  . BUNIONECTOMY Bilateral   . COSMETIC SURGERY  2016   "back of my neck; related to earlier fusion"  . CYSTOSCOPY W/ STONE MANIPULATION  "several times"  . DILATION AND CURETTAGE OF UTERUS    . FOREHEAD RECONSTRUCTION Right    "removed bone protruding out of my forehead"  . HARDWARE REMOVAL  2016   "related to neck OR"  . INCONTINENCE SURGERY    . MINIMALLY INVASIVE MAZE PROCEDURE N/A 01/17/2020   Procedure: MINIMALLY INVASIVE MAZE PROCEDURE;  Surgeon: Rexene Alberts, MD;  Location: [facility];  Service:  Open Heart Surgery;  Laterality: N/A;  . MITRAL VALVE REPAIR Right 01/17/2020   Procedure: MINIMALLY INVASIVE MITRAL VALVE REPAIR (MVR) USING MEMO 4D 32MM;  Surgeon: Rexene Alberts, MD;  Location: [facility];  Service: Open Heart Surgery;  Laterality: Right;  . POSTERIOR CERVICAL FUSION/FORAMINOTOMY  ~ 2008; 2015  . RIGHT/LEFT HEART CATH AND CORONARY ANGIOGRAPHY N/A 12/02/2019   Procedure: RIGHT/LEFT HEART CATH AND CORONARY  ANGIOGRAPHY;  Surgeon: Burnell Blanks, MD;  Location: [facility];  Service: Cardiovascular;  Laterality: N/A;  . SHOULDER ARTHROSCOPY W/ ROTATOR CUFF REPAIR Right 2012  . SPINAL FUSION  06/2014 - 2018 X ?7   "scoliosis; my entire back"  . TEE WITHOUT CARDIOVERSION N/A 11/21/2019   Procedure: TRANSESOPHAGEAL ECHOCARDIOGRAM (TEE);  Surgeon: Josue Hector, MD;  Location: [facility] ENDOSCOPY;  Service: Cardiovascular;  Laterality: N/A;  . TEE WITHOUT CARDIOVERSION N/A 01/17/2020   Procedure: TRANSESOPHAGEAL ECHOCARDIOGRAM (TEE);  Surgeon: Rexene Alberts, MD;  Location: [facility];  Service: Open Heart Surgery;  Laterality: N/A;  . TUBAL LIGATION    . VAGINAL HYSTERECTOMY     TVH     Medications Prior to Admission  Medication Sig Dispense Refill Last Dose  . acetaminophen (TYLENOL) 500 MG tablet Take 500 mg by mouth every 6 (six) hours as needed for moderate pain.    01/16/2020 at Unknown time  . b complex vitamins tablet Take 1 tablet by mouth daily.   Past Week at Unknown time  . Biotin 10 MG CAPS Take 10 mg by mouth daily.   Past Week at Unknown time  . Calcium Carbonate Antacid (TUMS CHEWY BITES PO) Take 1 tablet by mouth daily as needed (reflux).    Past Week at Unknown time  . Calcium Citrate-Vitamin D (CALCIUM + D PO) Take 1 tablet by mouth daily.   Past Week at Unknown time  . Carboxymethylcellul-Glycerin (LUBRICATING EYE DROPS OP) Place 1 drop into both eyes daily as needed (dry eyes).   Past Month at Unknown time  . cholecalciferol (VITAMIN D3) 25 MCG (1000 UNIT) tablet Take 1,000 Units by mouth daily.   Past Week at Unknown time  . clonazePAM (KLONOPIN) 0.5 MG tablet Take 0.5 mg by mouth daily as needed for anxiety.   01/17/2020 at [place]  . cyclobenzaprine (FLEXERIL) 10 MG tablet Take 10 mg by mouth at bedtime as needed for muscle spasms.   5 Past Month at Unknown time  . diltiazem (CARDIZEM CD) 240 MG 24 hr capsule Take 1 capsule (240 mg total) by mouth daily. (Patient taking  differently: Take 240 mg by mouth daily with supper. ) 30 capsule 6 01/16/2020 at Unknown time  . dofetilide (TIKOSYN) 250 MCG capsule Take 1 capsule (250 mcg total) by mouth 2 (two) times daily. 180 capsule 3 01/16/2020 at Unknown time  . famotidine (PEPCID) 20 MG tablet Take 1 tablet (20 mg total) by mouth daily. (Patient taking differently: Take 20 mg by mouth daily as needed for heartburn or indigestion. ) 90 tablet 1 Past Month at Unknown time  . fluticasone (FLONASE) 50 MCG/ACT nasal spray Place 1 spray into both nostrils daily as needed for allergies or rhinitis.   01/16/2020 at Unknown time  . furosemide (LASIX) 20 MG tablet Take 1.5 tablets (30 mg total) by mouth daily. 45 tablet 3 Past Week at Unknown time  . gabapentin (NEURONTIN) 100 MG capsule Take 100 mg by mouth 3 (three) times daily as needed (pain.).    01/16/2020 at Unknown time  .  lactulose (CONSTULOSE) 10 GM/15ML solution Take 20 g by mouth daily as needed for moderate constipation (constipation.).    Past Week at Unknown time  . levocetirizine (XYZAL) 5 MG tablet Take 5 mg by mouth daily as needed for allergies.    Past Week at Unknown time  . Magnesium 250 MG TABS Take 250 mg by mouth daily.   Past Week at Unknown time  . meclizine (ANTIVERT) 25 MG tablet Take 1 tablet (25 mg total) by mouth 3 (three) times daily as needed for dizziness. 45 tablet 0 Past Week at Unknown time  . metoprolol succinate (TOPROL-XL) 25 MG 24 hr tablet Take 1/2 (one-half) tablet by mouth twice daily 60 tablet 3 01/16/2020 at Unknown time  . Nystatin POWD Apply small amount to affected area. (Patient taking differently: Apply 1 application topically 2 (two) times daily as needed (skin irritation (under breasts)). ) 1 Bottle 3   . oxyCODONE (ROXICODONE) 5 MG/5ML solution Take 8 mg by mouth every 4 (four) hours as needed for moderate pain.    01/17/2020 at [facility]  . polyethylene glycol (MIRALAX / GLYCOLAX) packet Take 17 g by mouth daily as needed for mild  constipation.   Past Week at Unknown time  . potassium chloride (K-DUR) 10 MEQ tablet Take 1 tablet (10 mEq total) by mouth daily. 90 tablet 3 Past Week at Unknown time  . sodium chloride (OCEAN) 0.65 % SOLN nasal spray Place 1 spray into both nostrils as needed for congestion (nose bleeds).   Past Week at Unknown time  . XARELTO 20 MG TABS tablet TAKE 1 TABLET BY MOUTH ONCE DAILY WITH SUPPER (Patient not taking: Reported on 01/16/2020) 30 tablet 5 01/12/2020  . denosumab (PROLIA) 60 MG/ML SOSY injection Inject 60 mg into the skin every 6 (six) months.   More than a month at Unknown time  . valACYclovir (VALTREX) 500 MG tablet Take 500 mg by mouth daily as needed (breakouts).    More than a month at Unknown time    Inpatient Medications:  . aspirin EC  81 mg Oral Daily  . bisacodyl  10 mg Oral Daily   Or  . bisacodyl  10 mg Rectal Daily  . Chlorhexidine Gluconate Cloth  6 each Topical Daily  . clonazePAM  0.5 mg Oral BID  . docusate sodium  200 mg Oral Daily  . dofetilide  250 mcg Oral BID  . ferrous [id number] C-folic acid  1 capsule Oral Q breakfast  . magnesium oxide  200 mg Oral Daily  . mouth rinse  15 mL Mouth Rinse BID  . metoprolol tartrate  25 mg Oral BID  . pantoprazole  40 mg Oral Daily  . potassium chloride  40 mEq Oral Once  . sodium chloride flush  10-40 mL Intracatheter Q12H  . sodium chloride flush  3 mL Intravenous Q12H  . [START ON 01/25/2020] warfarin  4 mg Oral q1600  . Warfarin - Physician Dosing Inpatient   Does not apply q1600    Allergies:  Allergies  Allergen Reactions  . Buprenorphine Nausea Only and Other (See Comments)    sedation and adhesive reaction  . Contrast Media [Iodinated Diagnostic Agents] Hives  . Cymbalta [Duloxetine Hcl]     Severe diarrhea and upset stomach  . Erythromycin Nausea And Vomiting    Dizziness   . Hydrocodone     Sedation,dizziness, and nausea  . Hydromorphone     Sedation,dizziness, and nausea  . Latex      hives  and sores  . Levofloxacin Nausea And Vomiting  . Metrizamide Hives  . Nsaids     CANNOT TAKE PER CARDIOLOGIST DUE TO AFIB   . Nucynta [Tapentadol Hcl]     nausea and sedation  . Other     CAN NOT TAKE , aLPRAZOLAM, OR ELAVIL DUE TO AFIB FOR ANXIETY  IV CONTRAST /"DYE".  . Oxycodone     Delusions (intolerance)  PILLS ONLY sedation, dizziness, nausea,  and itching  . Pentazocine     Unknown  . Septra [Bactrim]     Hives   . Sulfa Antibiotics Hives    rash  . Sulfasalazine Hives  . Adhesive [Tape] Rash and Other (See Comments)    Heart monitor stickers must be rotated in order to prevent rash  . Morphine Anxiety    sedation and nausea    Social History   Socioeconomic History  . Marital status: Divorced    Spouse name: Not on file  . Number of children: Not on file  . Years of education: Not on file  . Highest education level: Not on file  Occupational History  . Not on file  Tobacco Use  . Smoking status: Former Smoker    Packs/day: 0.10    Years: 14.00    Pack years: 1.40    Types: Cigarettes  . Smokeless tobacco: Never Used  . Tobacco comment: 03/31/2018 "quit ~ 1980; someday smoker when I did smoke; never addicted"  Vaping Use  . Vaping Use: Never used  Substance and Sexual Activity  . Alcohol use: Never  . Drug use: Yes    Types: Oxycodone, Benzodiazepines    Comment: 03/31/2018 "for chronic neck and back pain", takes Klonopin at times.   . Sexual activity: Not Currently  Other Topics Concern  . Not on file  Social History Narrative  . Not on file   Social Determinants of Health   Financial Resource Strain:   . Difficulty of Paying Living Expenses:   Food Insecurity:   . Worried About Charity fundraiser in the Last Year:   . Arboriculturist in the Last Year:   Transportation Needs:   . Film/video editor (Medical):   [name] Lack of Transportation (Non-Medical):   Physical Activity:   . Days of Exercise per Week:   . Minutes of Exercise per Session:    Stress:   . Feeling of Stress :   Social Connections:   . Frequency of Communication with Friends and Family:   . Frequency of Social Gatherings with Friends and Family:   . Attends Religious Services:   . Active Member of Clubs or Organizations:   . Attends Archivist Meetings:   [name] Marital Status:   Intimate Partner Violence:   . Fear of Current or Ex-Partner:   . Emotionally Abused:   [name] Physically Abused:   . Sexually Abused:      Family History  Problem Relation Age of Onset  . Diabetes Father   . Hyperlipidemia Sister   . Heart disease Sister   . Stroke Sister   . Diabetes Brother   . Hyperlipidemia Sister   . Heart disease Sister   . Arthritis Mother   . Heart disease Mother   . Uterine cancer Mother   . Diabetes Brother   . Heart Problems Brother      Review of Systems: All other systems reviewed and are otherwise negative except as noted above.  Physical Exam: Vitals:  01/23/20 1934 01/24/20 0001 01/24/20 0321 01/24/20 0825  BP: 109/64 114/63 122/68 118/66  Pulse: (!) 106 96 (!) 103 (!) 110  Resp: (!) 22 (!) 22 20 (!) 22  Temp: 98.2 F (36.8 C) 98.5 F (36.9 C) 97.6 F (36.4 C) 99 F (37.2 C)  TempSrc: Oral Oral Oral Oral  SpO2: 94% 99% 97% 96%  Weight:      Height:        GEN- The patient is elderly appearing, alert and oriented x 3 today.   HEENT: normocephalic, atraumatic; sclera clear, conjunctiva pink; hearing intact; oropharynx clear; neck supple Lungs- Clear to ausculation bilaterally, normal work of breathing.  No wheezes, rales, rhonchi Heart- Regular and somewhat tachy rhythm, no murmurs, rubs or gallops GI- soft, non-tender, non-distended, bowel sounds present Extremities- no clubbing, cyanosis, or edema; DP/PT/radial pulses 2+ bilaterally MS- no significant deformity or atrophy Skin- warm and dry, no rash or lesion Psych- euthymic mood, full affect Neuro- strength and sensation are intact  Labs:   Lab Results    Component Value Date   WBC 7.7 01/24/2020   HGB 10.0 (L) 01/24/2020   HCT 32.9 (L) 01/24/2020   MCV 87.5 01/24/2020   PLT 145 (L) 01/24/2020    Recent Labs  Lab 01/24/20 0356  NA 140  K 3.8  CL 100  CO2 29  BUN 12  CREATININE 0.74  CALCIUM 7.3*  GLUCOSE 90      Radiology/Studies: DG Chest 2 View  Result Date: 01/14/2020 CLINICAL DATA:  Preop mitral valve repair EXAM: CHEST - 2 VIEW COMPARISON:  04/12/2019 FINDINGS: There is hyperinflation of the lungs compatible with COPD. Heart and mediastinal contours are within normal limits. No focal opacities or effusions. No acute bony abnormality. Posterior spinal rods and fusion noted throughout the thoracic spine. IMPRESSION: COPD.  No active disease. Electronically Signed   By: Rolm Baptise M.D.   On: 01/14/2020 16:02   DG Chest Port 1 View  Result Date: 01/22/2020 CLINICAL DATA:  Follow-up mitral valve repair. EXAM: PORTABLE CHEST 1 VIEW COMPARISON:  Yesterday. FINDINGS: The previously seen 2 right chest tubes are no longer demonstrated. Small to moderate-sized bilateral pleural effusions with little change. Stable enlarged cardiac silhouette, prosthetic mitral valve, left atrial clip and extensive spinal fixation hardware. No significant change in probable atelectasis at both lung bases. Diffuse osteopenia and old/subacute right rib fractures. IMPRESSION: Stable small to moderate-sized bilateral pleural effusions with associated bibasilar atelectasis, with little change. Electronically Signed   By: Claudie Revering M.D.   On: 01/22/2020 15:56   DG Chest Port 1 View  Result Date: 01/20/2020 CLINICAL DATA:  Chest tube, status post open heart surgery EXAM: PORTABLE CHEST 1 VIEW COMPARISON:  01/19/2020 FINDINGS: No significant interval change in AP portable chest radiograph. Right-sided chest and mediastinal drainage tubes remain in position. No significant pneumothorax. Small, layering bilateral pleural effusions. Mitral valve prosthesis.  Extensive, partially imaged posterior spinal fusion hardware. IMPRESSION: 1. No significant interval change in AP portable chest radiograph. Right-sided chest and mediastinal drainage tubes remain in position. No significant pneumothorax. 2.  Small pleural effusions. Electronically Signed   By: Eddie Candle M.D.   On: 01/20/2020 09:43   DG Chest Port 1 View  Result Date: 01/19/2020 CLINICAL DATA:  Status post mitral valve repair. EXAM: PORTABLE CHEST 1 VIEW COMPARISON:  January 18, 2020. FINDINGS: Stable cardiomediastinal silhouette. Stable 2 right-sided chest tubes are noted. No pneumothorax is noted. Stable bibasilar atelectasis and small effusions are noted. Bony  thorax is unremarkable. IMPRESSION: Stable 2 right-sided chest tubes without pneumothorax. Stable bibasilar atelectasis and small effusions are noted. Electronically Signed   By: Marijo Conception M.D.   On: 01/19/2020 08:18   DG Chest Port 1 View  Result Date: 01/18/2020 CLINICAL DATA:  Status post mitral valve repair. EXAM: PORTABLE CHEST 1 VIEW COMPARISON:  January 17, 2020. FINDINGS: Stable cardiomediastinal silhouette. Stable position of 2 right-sided chest tubes. No pneumothorax is noted. Endotracheal tube has been removed. Left internal jugular Swan-Ganz catheter is unchanged in position. Mild bibasilar subsegmental atelectasis is noted with probable small pleural effusions. Bony thorax is unremarkable. IMPRESSION: Stable position of 2 right-sided chest tubes without pneumothorax. Mild bibasilar subsegmental atelectasis is noted with probable small pleural effusions. Electronically Signed   By: Marijo Conception M.D.   On: 01/18/2020 08:35   DG Chest Port 1 View  Result Date: 01/17/2020 CLINICAL DATA:  Mitral valve repair.  Atelectasis. EXAM: PORTABLE CHEST 1 VIEW COMPARISON:  01/13/2020 FINDINGS: Extensive cervicothoracic spinal hardware. Atrial clip noted.  Mitral valve prosthesis noted. Endotracheal tube tip 2.2 cm above the carina.  Mediastinal drain in place. Left internal jugular Swan-Ganz catheter tip in the main pulmonary artery. Two right-sided chest tubes are present. A nasogastric tube terminates in the stomach body. No pneumothorax. Low lung volumes with mild interstitial accentuation bilaterally. Subtle interstitial edema not excluded. There is some mild atelectasis at the right lung base. Old right lateral rib deformities noted. Old left rib deformities noted. Bony demineralization. IMPRESSION: 1. Tubes and lines appear satisfactorily positioned. 2. Mild atelectasis at the right lung base. 3. Mild interstitial accentuation could reflect mild interstitial edema. 4. No current pneumothorax identified. Electronically Signed   By: Van Clines M.D.   On: 01/17/2020 18:13   ECHO INTRAOPERATIVE TEE  Result Date: 01/19/2020 .  Left ventricle: Normal cavity size and wall thickness. .  Left atrium: Systolic flow reversal in a pulmonary vein. Left atrial appendage filling and emptying velocities are decreased. .  Aortic valve: No stenosis. Mild regurgitation with a centrally directed jet. .  Mitral valve:  Ruptured chordae causing severe anterior leaflet incompetence. Severe regurgitation. There is  prolapse of the medial segment (A3) of the anterior mitral leaflet. Flail portion of the anterior leaflet involving the medial segment. The annulus is mildy. .  Right ventricle:  Normal cavity size and ejection fraction. .  Tricuspid valve: Moderate regurgitation. The tricuspid valve regurgitation jet is central. .  Pulmonic valve: Mild regurgitation.   VAS US DOPPLER PRE CABG  Result Date: 01/13/2020 PREOPERATIVE VASCULAR EVALUATION  Indications:      Pre-MVR evaluation. Comparison Study: No prior study Performing Technologist: Maudry Mayhew MHA, RVT, RDCS, RDMS  Examination Guidelines: A complete evaluation includes B-mode imaging, spectral Doppler, color Doppler, and power Doppler as needed of all accessible portions of each  vessel. Bilateral testing is considered an integral part of a complete examination. Limited examinations for reoccurring indications may be performed as noted.  Right Carotid Findings: +----------+--------+--------+--------+--------+--------+           PSV cm/sEDV cm/sStenosisDescribeComments +----------+--------+--------+--------+--------+--------+ CCA Prox  94      19                               +----------+--------+--------+--------+--------+--------+ CCA Distal62      17                               +----------+--------+--------+--------+--------+--------+  ICA Prox  57      12                               +----------+--------+--------+--------+--------+--------+ ICA Distal83      23                               +----------+--------+--------+--------+--------+--------+ ECA       65      6                                +----------+--------+--------+--------+--------+--------+ Portions of this table do not appear on this page. +----------+--------+-------+----------------+------------+           PSV cm/sEDV cmsDescribe        Arm Pressure +----------+--------+-------+----------------+------------+ Subclavian82             Multiphasic, WNL             +----------+--------+-------+----------------+------------+ +---------+--------+--+--------+--+---------+ VertebralPSV cm/s42EDV cm/s11Antegrade +---------+--------+--+--------+--+---------+ Left Carotid Findings: +----------+--------+--------+--------+--------+--------+           PSV cm/sEDV cm/sStenosisDescribeComments +----------+--------+--------+--------+--------+--------+ CCA Prox  89      20                               +----------+--------+--------+--------+--------+--------+ CCA Distal82      22                               +----------+--------+--------+--------+--------+--------+ ICA Prox  41      14                                +----------+--------+--------+--------+--------+--------+ ICA Distal96      33                               +----------+--------+--------+--------+--------+--------+ ECA       63      10                               +----------+--------+--------+--------+--------+--------+ +----------+--------+--------+----------------+------------+ SubclavianPSV cm/sEDV cm/sDescribe        Arm Pressure +----------+--------+--------+----------------+------------+           110             Multiphasic, WNL             +----------+--------+--------+----------------+------------+ +---------+--------+--+--------+--+---------+ VertebralPSV cm/s60EDV cm/s21Antegrade +---------+--------+--+--------+--+---------+   Right Doppler Findings: +--------+--------+-----+---------+--------+ Site    PressureIndexDoppler  Comments +--------+--------+-----+---------+--------+ [id number]          triphasic         +--------+--------+-----+---------+--------+ Radial               triphasic         +--------+--------+-----+---------+--------+ Ulnar                triphasic         +--------+--------+-----+---------+--------+  Left Doppler Findings: +--------+--------+-----+---------+--------+ Site    PressureIndexDoppler  Comments +--------+--------+-----+---------+--------+ [id number]          triphasic         +--------+--------+-----+---------+--------+ Radial  triphasic         +--------+--------+-----+---------+--------+ Ulnar                triphasic         +--------+--------+-----+---------+--------+  Summary: Right Carotid: The extracranial vessels were near-normal with only minimal wall                thickening or plaque. Left Carotid: The extracranial vessels were near-normal with only minimal wall               thickening or plaque. Vertebrals: Bilateral vertebral arteries demonstrate antegrade flow. Right Palmar Arch: Doppler waveforms decrease  >50% with right radial compression. Doppler waveforms decrease >50% with right ulnar compression.  Left Palmar Arch: Doppler waveforms decrease <50% w left radial compression. Doppler waveforms decrease <50% with left ulnar compression.  Electronically signed by Harold Barban MD on 01/13/2020 at 5:19:21 PM.    Final     EKG: EKG this am appears to show atrial flutter at 112 bpm, but p waves very small, Najir Roop review with [name] (personally reviewed)  TELEMETRY: Rates in 90-100s this am, p waves difficult to appreciate. Clearly had AF RVR yesterday evening, but has stabilized (personally reviewed)  Assessment/Plan: 1.  S/p MVR and MAZE procedure (minimally invasive) Overall stable post op course, complicated by AF and RVR  2. Paroxysmal AF with RVR / Atrial flutter Continue tikosyn 250 mcg BID Agree with adding lopressor and titrating as tolerated Suspect she Keijuan Schellhase have less AF as her cardiac irritability post-op continues to lessen.  She appears to now be in atrial flutter.  We Quintez Maselli make close AF clinic follow up for towards the end of next week.  Can consider DCCV at that point if remains out of rhythm.  For questions or updates, please contact [place] Please consult [email or web address] for contact info under Cardiology/STEMI.  Signed, Shirley Friar, PA-C  01/24/2020 9:01 AM      I have seen and examined this patient with Oda Kilts.  Agree with above, note added to reflect my findings.  On exam, tachycardic, no murmurs.  Patient is now status post mitral valve repair with maze for atrial fibrillation and mitral regurgitation.  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  She has been put back on her Tikosyn as well as started on metoprolol.  Her blood pressure has been stable.  Her heart rate is well controlled.  We Hero Kulish arrange for follow-up in atrial fibrillation clinic next week when she is discharged.  At that point, they can discuss whether or not  cardioversion is a reasonable option for her.  We Khyri Hinzman continue with her current medications.  Robbin Escher M. Federico Maiorino MD 01/24/2020 4:19 PM

## 2020-01-24 NOTE — Care Management Important Message (Signed)
Important Message  Patient Details  Name: [name] MRN: [id number] Date of Birth: 1946-03-13   Medicare Important Message Given:  Yes     Shelda Altes 01/24/2020, 10:56 AM

## 2020-01-24 NOTE — Progress Notes (Signed)
CARDIAC REHAB PHASE I   PRE:  Rate/Rhythm: 98 SR  BP:  Supine:   Sitting: 97/52  Standing:    SaO2: 94%RA  MODE:  Ambulation: 75 ft   POST:  Rate/Rhythm: 106 ST  To bathroom 0951-1033 Pt walked 75 ft on RA with rolling walker and gait belt use and asst x 1. Needed much encouragement and emotional support to keep from getting tearful. Tolerated walk well and remained in NSR. To bathroom and then back to recliner. Sister in room. Pt encouraged to sit up until after lunch.      Graylon Good, RN BSN  01/24/2020 10:28 AM

## 2020-01-24 NOTE — Progress Notes (Signed)
Pharmacy: Dofetilide (Tikosyn) - Follow Up Assessment and Electrolyte Replacement  Pharmacy consulted to assist in monitoring and replacing electrolytes in this 74 y.o. female admitted on 01/17/2020 undergoing dofetilide re-initiation. First dofetilide dose: 250 mcg  Labs:    Component Value Date/Time   K 3.8 01/24/2020 0356   MG 2.0 01/24/2020 0356     Plan: Potassium: K 3.8-3.9:  Give KCl 40 mEq po x1   Magnesium: Mg 1.8-2: Give Mg 2 gm IV x1     Thank you for allowing pharmacy to participate in this patient's care  Anette Guarneri, PharmD 01/24/2020  8:38 AM

## 2020-01-25 LAB — CBC
HCT: 32 % — ABNORMAL LOW (ref 36.0–46.0)
Hemoglobin: 9.7 g/dL — ABNORMAL LOW (ref 12.0–15.0)
MCH: 26.8 pg (ref 26.0–34.0)
MCHC: 30.3 g/dL (ref 30.0–36.0)
MCV: 88.4 fL (ref 80.0–100.0)
Platelets: 156 10*3/uL (ref 150–400)
RBC: 3.62 MIL/uL — ABNORMAL LOW (ref 3.87–5.11)
RDW: 22.1 % — ABNORMAL HIGH (ref 11.5–15.5)
WBC: 7.9 10*3/uL (ref 4.0–10.5)
nRBC: 0 % (ref 0.0–0.2)

## 2020-01-25 LAB — BASIC METABOLIC PANEL
Anion gap: 7 (ref 5–15)
BUN: 14 mg/dL (ref 8–23)
CO2: 30 mmol/L (ref 22–32)
Calcium: 7.5 mg/dL — ABNORMAL LOW (ref 8.9–10.3)
Chloride: 102 mmol/L (ref 98–111)
Creatinine, Ser: 0.67 mg/dL (ref 0.44–1.00)
GFR calc Af Amer: >60 mL/min (ref >60)
GFR calc non Af Amer: >60 mL/min (ref >60)
Glucose, Bld: 103 mg/dL — ABNORMAL HIGH (ref 70–99)
Potassium: 4.3 mmol/L (ref 3.5–5.1)
Sodium: 139 mmol/L (ref 135–145)

## 2020-01-25 LAB — PROTIME-INR
INR: 2.4 — ABNORMAL HIGH (ref 0.8–1.2)
INR: 2.4 — ABNORMAL HIGH (ref 0.8–1.2)
Prothrombin Time: 25.7 seconds — ABNORMAL HIGH (ref 11.4–15.2)
Prothrombin Time: 25.3 seconds — ABNORMAL HIGH (ref 11.4–15.2)

## 2020-01-25 LAB — MAGNESIUM: Magnesium: 2.3 mg/dL (ref 1.7–2.4)

## 2020-01-25 MED ORDER — METOPROLOL TARTRATE 50 MG PO TABS
50.0000 mg | ORAL_TABLET | Freq: Two times a day (BID) | ORAL | Status: DC
  Administered 2020-01-25 – 2020-02-02 (×17): 50 mg via ORAL
  Filled 2020-01-25 (×17): qty 1

## 2020-01-25 MED ORDER — METOPROLOL TARTRATE 5 MG/5ML IV SOLN
10.0000 mg | Freq: Once | INTRAVENOUS | Status: AC
  Administered 2020-01-25: 10 mg via INTRAVENOUS

## 2020-01-25 MED ORDER — SODIUM CHLORIDE 0.9 % IV BOLUS
250.0000 mL | Freq: Once | INTRAVENOUS | Status: AC
  Administered 2020-01-25: 250 mL via INTRAVENOUS

## 2020-01-25 MED ORDER — FENTANYL 25 MCG/HR TD PT72
1.0000 | MEDICATED_PATCH | TRANSDERMAL | Status: DC
  Administered 2020-01-25 – 2020-01-31 (×3): 1 via TRANSDERMAL
  Filled 2020-01-25 (×4): qty 1

## 2020-01-25 NOTE — Significant Event (Signed)
Rapid Response Event Note  Overview: Called by RN, pt in Afib RVR with rates up to 150s and hypotensive with BP 77/64 (69). Primary team notified  Initial Focused Assessment: On arrival, pt resting in bed A&O, appears anxious stating "I dont feel right". Afib on monitor with rate 120-150. BP 89/72 (79), RR 22, spO2 96% on 2L [place].   Interventions: Primary team at bedside 560ml bolus 10mg  Lopressor IV EKG 2nd PIV started   Plan of Care (if not transferred): Continue to monitor pt HR and BP. Watch pain levels and BP (Fentanyl makes pt hypotensive per PA). Pt may need cardioversion but BP is concern for sedation at this time. RN instructed to call with any changes or concerns. Will continue to follow along.   VS at end of event: HR 133, BP 114/78 (86), RR 25, spO2 99%  Event Summary:   Called at  1103   Event ended at [address]

## 2020-01-25 NOTE — Progress Notes (Signed)
Pharmacy: Dofetilide (Tikosyn) - Follow Up Assessment and Electrolyte Replacement  Pharmacy consulted to assist in monitoring and replacing electrolytes in this 74 y.o. female admitted on 01/17/2020 undergoing dofetilide re-initiation. First dofetilide dose: 250mcg  Labs:    Component Value Date/Time   K 4.3 01/25/2020 0512   MG 2.3 01/25/2020 8648     Plan: Potassium: K >/= 4: No additional supplementation needed  Magnesium: Mg > 2: No additional supplementation needed  Thank you for allowing pharmacy to participate in this patient's care   Amedeo Plenty 01/25/2020  8:55 AM

## 2020-01-25 NOTE — TOC Initial Note (Signed)
Transition of Care (TOC) - Initial/Assessment Note  Valentina Gu, BSN Transitions of Care Unit 4E- RN Case Manager See Treatment Team for direct phone #    Patient Details  Name: [name] MRN: [id number] Date of Birth: 08-Nov-1945  Transition of Care [facility]) CM/SW Contact:    Dawayne Patricia, RN Phone Number: 01/25/2020, 3:15 PM  Clinical Narrative:                 Pt s/p MVR and MAZE with post op afib- plan for TEE/cardioversion in AM- from home alone- orders placed for HHRN/PT- CM spoke with pt at bedside regarding transition plans- per pt she has sisters/family that will be taking turns staying with her post discharge- discussed orders for [facility] services- however pt wants to defer [facility] services until later - as she reports she has things she needs to take care of first and appointments she needs to get behind her before she can think about [facility] visits. She reports she has had HH in past and understands what the [facility] services are all about. List provided for [facility] choice- Per CMS guidelines from medicare.gov website with star ratings (copy placed in shadow chart)- she reports she has used [facility] in past.  As this time pt is deferring [facility] referral and states she will f/u with PCP at a later date if she decides she wants to do the [facility] services. Explained to pt that her PCP would need to order new [place] orders post discharge when she is ready and make the referral to her preferred agency. Pt voiced understanding. TOC will continue to follow should pt change her mind and decide she would like [facility] referral prior to discharge.   Expected Discharge Plan: [place] Barriers to Discharge: Continued Medical Work up   Patient Goals and CMS Choice Patient states their goals for this hospitalization and ongoing recovery are:: Just want to get home and get this behind me CMS Medicare.gov Compare Post Acute Care list provided to:: Patient Choice offered to / list presented to :  Patient  Expected Discharge Plan and Services Expected Discharge Plan: [place]   Discharge Planning Services: CM Consult Post Acute Care Choice: [place] arrangements for the past 2 months: Single Family Home                           HH Arranged: RN, PT, Patient Refused HH          Prior Living Arrangements/Services Living arrangements for the past 2 months: Single Family Home Lives with:: Self (will have sisters and family taking turns staying with her post discharge) Patient language and need for interpreter reviewed:: Yes Do you feel safe going back to the place where you live?: Yes      Need for Family Participation in Patient Care: Yes (Comment) Care giver support system in place?: Yes (comment) Current home services: DME Criminal Activity/Legal Involvement Pertinent to Current Situation/Hospitalization: No - Comment as needed  Activities of Daily Living Home Assistive Devices/Equipment: Eyeglasses, Dentures (specify type), Other (Comment) ADL Screening (condition at time of admission) Patient's cognitive ability adequate to safely complete daily activities?: Yes Is the patient deaf or have difficulty hearing?: Yes Does the patient have difficulty seeing, even when wearing glasses/contacts?: No Does the patient have difficulty concentrating, remembering, or making decisions?: No Patient able to express need for assistance with ADLs?: Yes Does the patient have  difficulty dressing or bathing?: No Independently performs ADLs?: Yes (appropriate for developmental age) Does the patient have difficulty walking or climbing stairs?: No Weakness of Legs: Both Weakness of Arms/Hands: Both  Permission Sought/Granted Permission sought to share information with : Facility Art therapist granted to share information with : No              Emotional Assessment Appearance:: Appears stated age Attitude/Demeanor/Rapport:  Engaged Affect (typically observed): Appropriate, Overwhelmed Orientation: : Oriented to Self, Oriented to Place, Oriented to  Time, Oriented to Situation Alcohol / Substance Use: Not Applicable Psych Involvement: No (comment)  Admission diagnosis:  S/P mitral valve repair [O37.290] Patient Active Problem List   Diagnosis Date Noted  . S/P mitral valve repair + maze procedure 01/17/2020  . 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/2020  . Severe mitral regurgitation   . 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 Ankle swelling, left 10/20/2019  . Ankle swelling, right 10/20/2019  . Secondary hypercoagulable state ([place]) 08/19/2019  . Left inguinal hernia 03/11/2019  . Visit for monitoring Tikosyn therapy 03/29/2018  . Osteoporosis 09/23/2017  . Fatigue 02/18/2017  . Physical exam 04/21/2016  . Anxiety and depression 02/13/2016  . Chronic pain 02/13/2016  . Vertigo 11/16/2015  . B12 deficiency 10/01/2015  . Hearing loss due to cerumen impaction 08/23/2015  . Foot pain, right 05/23/2015  . Hyperlipidemia 04/19/2015  . Protein-calorie malnutrition ([place]) 10/19/2014  . Fracture of multiple ribs 08/07/2014  . Paroxysmal atrial fibrillation (HCC)   . Tachycardia   . Anemia 08/06/2014  . Constipation 07/14/2014  . Lumbar back pain 05/19/2012  . Seasonal allergic rhinitis 11/06/2011  . Neuropathy 11/06/2011  . S/P cervical spinal fusion 11/06/2011  . GERD (gastroesophageal reflux disease) 11/06/2011   PCP:  [name], MD Pharmacy:   [facility] [address] [place] [address] Phone: [phone] Fax: [phone]  [facility] [place], [place] [phone] [place] [address] Phone: [phone] Fax: [phone]     Social Determinants of Health (SDOH) Interventions    Readmission Risk Interventions No flowsheet  data found.

## 2020-01-25 NOTE — Progress Notes (Signed)
29 Pt having rhythm issues. Will continue to follow. Graylon Good RN BSN 01/25/2020 11:15 AM

## 2020-01-25 NOTE — Progress Notes (Signed)
   Saw patient earlier this am, noted to have increased HRs with incisional pain. rates 110-120s at the time. Had planned to increase BB starting with evening dose.   Pt now having HRs in the 140-150s with hypotension down into the 70s.   She is on Tikosyn 250 mcg and has been previously intolerant to amiodarone.   She may ultimately require DCCV with TEE given recent surgery and subtherapeutic INR  but will need to get her pressure up for anyone to feel comfortable sedating her.   Discussed with [name]; Will also discuss with Dr. Caryl Comes.   Legrand Como [address] [place], PA-C  01/25/2020 11:08 AM

## 2020-01-25 NOTE — H&P (View-Only) (Signed)
   Pt feeling better currently. HRs 120s and BPs have improved.   Discussed plan with patient for TEE/DCCV in the am.   [facility] has been requested to perform a transesophageal echocardiogram on 01/26/2020 for persistent atrial fibrillation along with direct current cardioversion.  After careful review of history and examination, the risks and benefits of transesophageal echocardiogram have been explained including risks of esophageal damage, perforation (1:10,000 risk), bleeding, pharyngeal hematoma as well as other potential complications associated with conscious sedation including aspiration, arrhythmia, respiratory failure and death. Alternatives to treatment were discussed, questions were answered. Patient is willing to proceed.   Shirley Friar, PA-C 01/25/2020 2:34 PM

## 2020-01-25 NOTE — Progress Notes (Addendum)
      [address]       [place] [address]             [phone]       Episode of Afib with RVR today, HR in the 150s and BP 72/60 . 10mg  of IV lopressor ordered. Bolus of 537ml total of normal saline. Joesph July and myself at the bedside. Patient very anxious and not feeling well.   After fluid and medication administration, HR remained elevated but improved at 120s-130s. BP improved to 114/78 (86). Patient was a little calmer and resting comfortably.    Plan: TEE/DCCV tomorrow at 8am. NPO after midnight. Continue to monitor vitals closely. Dr. Roxy Manns aware.   Nicholes Rough, PA-C

## 2020-01-25 NOTE — Consult Note (Signed)
   [facility] Inpatient Consult   01/25/2020  [name] 12-02-1945 [id number]   [place] Organization [ACO] Patient:  Medicare NextGen   Patient screened for high risk score for unplanned readmission score and for length of stay hospitalizations to check for potential [facility] Management service needs and barriers to care for transition.  Review of patient's medical record reveals patient is having cardiac rhythm issues this morning.  Review of PT/OT notes for recommendations dependent on progress with 24 hour care being the transition goal.   Primary Care Provider is [name] [name], MD this provider is listed to provide the transition of care [TOC] for post hospital follow up.  Plan:  Continue to follow progress and disposition to assess for post hospital care management needs.    Please place a [facility] Care Management consult as appropriate and for questions contact:   [name], RN BSN [facility] Liaison  [phone] business mobile phone Toll free office [phone]  Fax number: [phone] [email or web address]

## 2020-01-25 NOTE — Progress Notes (Signed)
Occupational Therapy Treatment Patient Details Name: [name] MRN: [id number] DOB: 06-07-1946 Today's Date: 01/25/2020    History of present illness On 01/17/20 pt underwent minimall invasive MVR, minimally invasive Maze procedure. Then required median sternotomy for repair of lt ventricular free-wall laceration. PMH - mitral regurgitation,afib, severe scoliosis for which she has undergone numerous surgical procedures, chronic pain on long-term oral narcotics, anxiety, osteoporosis, degenerative arthritis, and peripheral neuropathy   OT comments  Patient met seated EOB nearing tears with perseveration on receiving pain medication from nursing. RN notified with plans to administer medication. With some coaxing/encouragement, patient in agreement with participation with focus on therapeutic exercise seated EOB while maintaining sternal precautions, functional transfers, and functional mobility within patients tolerance. Patient would benefit from continued acute OT services in prep for d/c to next level of care with recommendation for HHOT vs. SNF rehab pending patient progress.    Follow Up Recommendations  Home health OT;Supervision/Assistance - 24 hour    Equipment Recommendations  3 in 1 bedside commode    Recommendations for Other Services      Precautions / Restrictions Precautions Precautions: Sternal;Fall Precaution Booklet Issued: Yes (comment) Precaution Comments: verbal discussion of sternal precautions; requiring cues Restrictions Weight Bearing Restrictions: Yes Other Position/Activity Restrictions: Sternal precautions       Mobility Bed Mobility                  Transfers Overall transfer level: Needs assistance Equipment used: Rolling walker (2 wheeled) Transfers: Sit to/from Stand Sit to Stand: Min assist (Light min A progressing toward Min guard)         General transfer comment: Sit to stand from EOB with heart pillow    Balance Overall balance  assessment: Needs assistance Sitting-balance support: No upper extremity supported Sitting balance-Leahy Scale: Fair     Standing balance support: Bilateral upper extremity supported Standing balance-Leahy Scale: Poor                             ADL either performed or assessed with clinical judgement   ADL                                               Vision       Perception     Praxis      Cognition Arousal/Alertness: Awake/alert Behavior During Therapy: Flat affect;Anxious Overall Cognitive Status: Impaired/Different from baseline Area of Impairment: Memory                     Memory: Decreased recall of precautions                  Exercises Exercises: General Upper Extremity General Exercises - Upper Extremity Shoulder Flexion: AROM;10 reps;Seated Shoulder Extension: AROM;10 reps;Seated Shoulder ABduction: AROM;10 reps;Seated Shoulder ADduction: AROM;10 reps;Seated Elbow Flexion: AROM;10 reps;Seated Elbow Extension: AROM;10 reps;Seated Wrist Flexion: AROM;10 reps;Seated Wrist Extension: AROM;10 reps;Seated   Shoulder Instructions       General Comments Patient very anxious about medication and nearing tears throughout session.     Pertinent Vitals/ Pain       Pain Assessment: Faces Faces Pain Scale: Hurts even more Pain Location: right chest and back Pain Descriptors / Indicators: Guarding;Aching;Constant;Grimacing Pain Intervention(s): Monitored during session;Limited activity within patient's tolerance  Home Living  Prior Functioning/Environment              Frequency  Min 2X/week        Progress Toward Goals  OT Goals(current goals can now be found in the care plan section)  Progress towards OT goals: Progressing toward goals  Acute Rehab OT Goals Patient Stated Goal: To get pain medication OT Goal Formulation: With  patient Time For Goal Achievement: 02/05/20 Potential to Achieve Goals: Good ADL Goals Pt Will Perform Lower Body Dressing: with supervision;sitting/lateral leans;sit to/from stand Pt Will Transfer to Toilet: with supervision;ambulating;bedside commode Pt/caregiver will Perform Home Exercise Program: Increased strength;Both right and left upper extremity Additional ADL Goal #1: Pt will state sternal precautions with no verbal cues to assist in order to ensure safety for d/c home. Additional ADL Goal #2: Pt will increase to supervisionA for OOB ADL tasks with O2 sats >90%.  Plan Discharge plan remains appropriate    Co-evaluation                 AM-PAC OT "6 Clicks" Daily Activity     Outcome Measure   Help from another person eating meals?: None Help from another person taking care of personal grooming?: A Little Help from another person toileting, which includes using toliet, bedpan, or urinal?: A Lot Help from another person bathing (including washing, rinsing, drying)?: A Lot Help from another person to put on and taking off regular upper body clothing?: A Little Help from another person to put on and taking off regular lower body clothing?: A Lot 6 Click Score: 16    End of Session Equipment Utilized During Treatment: Gait belt;Rolling walker  OT Visit Diagnosis: Unsteadiness on feet (R26.81);Muscle weakness (generalized) (M62.81);Pain   Activity Tolerance Patient tolerated treatment well   Patient Left in chair;with call bell/phone within reach;with chair alarm set   Nurse Communication Patient requests pain meds        Time: 1062-6948 OT Time Calculation (min): 23 min  Charges: OT General Charges $OT Visit: 1 Visit OT Treatments $Therapeutic Activity: 8-22 mins $Therapeutic Exercise: 8-22 mins  Dez Stauffer H. OTR/L Supplemental OT, Department of rehab services [phone]   Cree Kunert R. H.  01/25/2020, 11:22 AM

## 2020-01-25 NOTE — Progress Notes (Addendum)
[address]       [place], [address]             [phone]      8 Days Post-Op Procedure(s) (LRB): MINIMALLY INVASIVE MITRAL VALVE REPAIR (MVR) USING MEMO 4D 32MM (Right) MINIMALLY INVASIVE MAZE PROCEDURE (N/A) TRANSESOPHAGEAL ECHOCARDIOGRAM (TEE) (N/A) Subjective: Anxious and in pain this morning. She is having 10/10 incisional chest pain. She also is asking for her IV metoprolol for her HR.   Objective: Vital signs in last 24 hours: Temp:  [97.6 F (36.4 C)-98.2 F (36.8 C)] 98.2 F (36.8 C) (07/21 0858) Pulse Rate:  [60-135] 135 (07/21 0858) Cardiac Rhythm: Atrial fibrillation (07/21 0706) Resp:  [16-20] 16 (07/21 0858) BP: (103-127)/(58-93) 106/93 (07/21 0858) SpO2:  [94 %-100 %] 94 % (07/21 0858)    Intake/Output from previous day: 07/20 0701 - 07/21 0700 In: 530 [P.O.:480; IV Piggyback:50] Out: 250 [Urine:250] Intake/Output this shift: Total I/O In: 200 [P.O.:200] Out: -   General appearance: alert, cooperative and mild distress Heart: sinus tachycardia Lungs: clear to auscultation bilaterally Abdomen: soft, non-tender; bowel sounds normal; no masses,  no organomegaly Extremities: extremities normal, atraumatic, no cyanosis or edema Wound: clean and dry  Lab Results: Recent Labs    01/24/20 0356 01/24/20 0356 01/24/20 1756 01/25/20 0512  WBC 7.7  --   --  7.9  HGB 10.0*   < > 10.9* 9.7*  HCT 32.9*   < > 34.3* 32.0*  PLT 145*  --   --  156   < > = values in this interval not displayed.   BMET:  Recent Labs    01/24/20 0356 01/25/20 0512  NA 140 139  K 3.8 4.3  CL 100 102  CO2 29 30  GLUCOSE 90 103*  BUN 12 14  CREATININE 0.74 0.67  CALCIUM 7.3* 7.5*    PT/INR:  Recent Labs    01/25/20 0512  LABPROT 25.7*  INR 2.4*   ABG    Component Value Date/Time   PHART 7.235 (L) 01/18/2020 0106   HCO3 21.7 01/18/2020 0106   TCO2 27 01/19/2020 2206   ACIDBASEDEF 5.0 (H) 01/18/2020 0106   O2SAT 76.5 01/20/2020 0436     CBG (last 3)  No results for input(s): GLUCAP in the last 72 hours.  Assessment/Plan: S/P Procedure(s) (LRB): MINIMALLY INVASIVE MITRAL VALVE REPAIR (MVR) USING MEMO 4D 32MM (Right) MINIMALLY INVASIVE MAZE PROCEDURE (N/A) TRANSESOPHAGEAL ECHOCARDIOGRAM (TEE) (N/A)  1. CV- sinus tachycardia, rate 130s.  EP following. Will give Metoprolol 5mg  IV this morning. BP well controlled. Continue Tikosyn and metoprolol 25mg  BID. No Amio due to intolerance.  2. Pulm- tolerating room air with good oxygen saturation 3. Renal-creatinine 0.67, electrolytes okay Potassium 4.3, Mag 2.3 4. H and H 9.7/32.0, expected acute blood loss anemia 5. Endo-blood glucose well controlled 6. INR 2.4. Coumadin on hold due to + fecal occult blood 7. Having a lot of pain this morning. 10/10. IV Fentanyl ordered and PO oxycodone.   Plan: Will continue to follow H and H. Holding coumadin. May need to get GI involved but stable right now. Working on pain control this morning with IV and PO medication. Receiving 5mg  IV metoprolol for her rate.    LOS: 8 days    Elgie Collard 01/25/2020   I have seen and examined the patient and agree with the assessment and plan as outlined.  Still in Afib but overall feels better than yesterday.  Hgb stable.  Extra dose metoprolol given IV.  Continue Tikosyn and metoprolol 25 bid.  Rexene Alberts, MD 01/25/2020

## 2020-01-25 NOTE — Progress Notes (Signed)
   Pt feeling better currently. HRs 120s and BPs have improved.   Discussed plan with patient for TEE/DCCV in the am.   [facility] has been requested to perform a transesophageal echocardiogram on 01/26/2020 for persistent atrial fibrillation along with direct current cardioversion.  After careful review of history and examination, the risks and benefits of transesophageal echocardiogram have been explained including risks of esophageal damage, perforation (1:10,000 risk), bleeding, pharyngeal hematoma as well as other potential complications associated with conscious sedation including aspiration, arrhythmia, respiratory failure and death. Alternatives to treatment were discussed, questions were answered. Patient is willing to proceed.   Shirley Friar, PA-C 01/25/2020 2:34 PM

## 2020-01-25 NOTE — Progress Notes (Signed)
Physical Therapy Treatment Patient Details Name: [name] MRN: [id number] DOB: [birth year] Today's Date: 01/25/2020    History of Present Illness On 01/17/20 pt underwent minimall invasive MVR, minimally invasive Maze procedure. Then required median sternotomy for repair of lt ventricular free-wall laceration. PMH - mitral regurgitation,afib, severe scoliosis for which she has undergone numerous surgical procedures, chronic pain on long-term oral narcotics, anxiety, osteoporosis, degenerative arthritis, and peripheral neuropathy    PT Comments    Pt was seen for completion of  ROM to legs, with observation of pulses and BP with guidance from nursing.  Noted that when her pulse was 165, pt was measured to have BP of 86/60.  Reported to nursing, pt is assisted to bed with pain complaints in chest.  Nursing was called to assist with meds.  Follow acutely to progress her mobility and strength as tolerated.  Follow Up Recommendations  Home health PT;Supervision/Assistance - 24 hour     Equipment Recommendations  Other (comment)    Recommendations for Other Services       Precautions / Restrictions Precautions Precautions: Sternal;Fall Precaution Booklet Issued: Yes (comment) Precaution Comments: pt does not verbaliize all precautions Restrictions Weight Bearing Restrictions: Yes Other Position/Activity Restrictions: Sternal precautions    Mobility  Bed Mobility Overal bed mobility: Needs Assistance Bed Mobility: Sit to Supine       Sit to supine: Min assist   General bed mobility comments: helping to get legs onto bed  Transfers                 General transfer comment: declined OOB  Ambulation/Gait                 Stairs             Wheelchair Mobility    Modified Rankin (Stroke Patients Only)       Balance Overall balance assessment: Needs assistance Sitting-balance support: Bilateral upper extremity supported;Feet supported Sitting  balance-Leahy Scale: Fair                                      Cognition Arousal/Alertness: Awake/alert Behavior During Therapy: Flat affect;Anxious Overall Cognitive Status: Impaired/Different from baseline Area of Impairment: Awareness;Memory;Attention                   Current Attention Level: Selective Memory: Decreased short-term memory     Awareness: Intellectual   General Comments: pt is in pain and is having low BP with elevated HR      Exercises General Exercises - Lower Extremity Ankle Circles/Pumps: AROM;10 reps Long Arc Quad: AROM;10 reps Heel Slides: AROM;10 reps Hip ABduction/ADduction: AROM;10 reps Hip Flexion/Marching: AROM;10 reps    General Comments General comments (skin integrity, edema, etc.): fearful of pain, noting chest pain which was immediately reported to nursing      Pertinent Vitals/Pain Pain Assessment: Faces Faces Pain Scale: Hurts even more Pain Location: surgery sites Pain Descriptors / Indicators: Grimacing;Guarding Pain Intervention(s): Limited activity within patient's tolerance;Monitored during session;Premedicated before session;Repositioned    Home Living                      Prior Function            PT Goals (current goals can now be found in the care plan section) Acute Rehab PT Goals Patient Stated Goal: Manage pain complaints Progress towards PT goals: Progressing  toward goals    Frequency    Min 3X/week      PT Plan Current plan remains appropriate    Co-evaluation              AM-PAC PT "6 Clicks" Mobility   Outcome Measure  Help needed turning from your back to your side while in a flat bed without using bedrails?: A Little Help needed moving from lying on your back to sitting on the side of a flat bed without using bedrails?: A Little Help needed moving to and from a bed to a chair (including a wheelchair)?: A Little Help needed standing up from a chair using your  arms (e.g., wheelchair or bedside chair)?: A Little Help needed to walk in hospital room?: A Little Help needed climbing 3-5 steps with a railing? : A Lot 6 Click Score: 17    End of Session   Activity Tolerance: Patient limited by pain Patient left: in bed;with call bell/phone within reach;with bed alarm set Nurse Communication: Mobility status PT Visit Diagnosis: Other abnormalities of gait and mobility (R26.89);Muscle weakness (generalized) (M62.81)     Time: 2482-5003 PT Time Calculation (min) (ACUTE ONLY): 27 min  Charges:  $Therapeutic Exercise: 8-22 mins $Therapeutic Activity: 8-22 mins                  Ramond Dial 01/25/2020, 8:57 PM  Mee Hives, PT MS Acute Rehab Dept. Number: [place] and [place]

## 2020-01-25 NOTE — Progress Notes (Signed)
0245-patient was in SR but now in Afib rate 120-130 on CCM 0257-Metoprolol Tartrate 5mg  IVP given for Afib 0310-Dr. Servando Snare paged 0313-Dr. Servando Snare returned page and orders received to give Lopressor 25mg  po 0326-Lopressor given per Servando Snare orders

## 2020-01-25 NOTE — Progress Notes (Signed)
Pt HR increased 130-150 BP 76/54  Tessa PA notified  new order IV bolus 552ml started. Charge RN notified along with rapid. 2nd IV started. New order 10 mg of metoprolol IV given now @1135  HR130 BP 103/77 (84) RR21 SPO@ 90 NC1L.. Will continue to monitor. Tessa at bedside.    Phoebe Sharps, RN

## 2020-01-26 ENCOUNTER — Inpatient Hospital Stay (HOSPITAL_COMMUNITY): Payer: Medicare Other | Admitting: Certified Registered"

## 2020-01-26 ENCOUNTER — Inpatient Hospital Stay (HOSPITAL_COMMUNITY): Payer: Medicare Other

## 2020-01-26 ENCOUNTER — Encounter (HOSPITAL_COMMUNITY)
Admission: RE | Disposition: A | Discharge status: Home Health | Payer: Self-pay | Source: Home / Self Care | Attending: Thoracic Surgery (Cardiothoracic Vascular Surgery)

## 2020-01-26 ENCOUNTER — Encounter (HOSPITAL_COMMUNITY): Payer: Self-pay | Admitting: Thoracic Surgery (Cardiothoracic Vascular Surgery)

## 2020-01-26 DIAGNOSIS — I4891 Unspecified atrial fibrillation: Secondary | ICD-10-CM

## 2020-01-26 HISTORY — PX: CARDIOVERSION: SHX1299

## 2020-01-26 HISTORY — PX: TEE WITHOUT CARDIOVERSION: SHX5443

## 2020-01-26 LAB — BASIC METABOLIC PANEL
Anion gap: 11 (ref 5–15)
BUN: 15 mg/dL (ref 8–23)
CO2: 25 mmol/L (ref 22–32)
Calcium: 7.4 mg/dL — ABNORMAL LOW (ref 8.9–10.3)
Chloride: 103 mmol/L (ref 98–111)
Creatinine, Ser: 0.76 mg/dL (ref 0.44–1.00)
GFR calc Af Amer: >60 mL/min (ref >60)
GFR calc non Af Amer: >60 mL/min (ref >60)
Glucose, Bld: 90 mg/dL (ref 70–99)
Potassium: 4.4 mmol/L (ref 3.5–5.1)
Sodium: 139 mmol/L (ref 135–145)

## 2020-01-26 LAB — CBC
HCT: 34.7 % — ABNORMAL LOW (ref 36.0–46.0)
Hemoglobin: 10.6 g/dL — ABNORMAL LOW (ref 12.0–15.0)
MCH: 27.5 pg (ref 26.0–34.0)
MCHC: 30.5 g/dL (ref 30.0–36.0)
MCV: 89.9 fL (ref 80.0–100.0)
Platelets: 219 10*3/uL (ref 150–400)
RBC: 3.86 MIL/uL — ABNORMAL LOW (ref 3.87–5.11)
RDW: 21.7 % — ABNORMAL HIGH (ref 11.5–15.5)
WBC: 10.2 10*3/uL (ref 4.0–10.5)
nRBC: 0 % (ref 0.0–0.2)

## 2020-01-26 LAB — MAGNESIUM: Magnesium: 2.3 mg/dL (ref 1.7–2.4)

## 2020-01-26 LAB — PROTIME-INR
INR: 2.2 — ABNORMAL HIGH (ref 0.8–1.2)
Prothrombin Time: 23.3 seconds — ABNORMAL HIGH (ref 11.4–15.2)

## 2020-01-26 SURGERY — ECHOCARDIOGRAM, TRANSESOPHAGEAL
Anesthesia: Monitor Anesthesia Care

## 2020-01-26 MED ORDER — WARFARIN SODIUM 2 MG PO TABS
2.0000 mg | ORAL_TABLET | Freq: Every day | ORAL | Status: DC
  Administered 2020-01-26 – 2020-01-29 (×4): 2 mg via ORAL
  Filled 2020-01-26 (×4): qty 1

## 2020-01-26 MED ORDER — BUTAMBEN-TETRACAINE-BENZOCAINE 2-2-14 % EX AERO
INHALATION_SPRAY | CUTANEOUS | Status: DC | PRN
  Administered 2020-01-26: 1 via TOPICAL

## 2020-01-26 MED ORDER — FUROSEMIDE 10 MG/ML IJ SOLN
40.0000 mg | Freq: Once | INTRAMUSCULAR | Status: AC
  Administered 2020-01-26: 40 mg via INTRAVENOUS
  Filled 2020-01-26: qty 4

## 2020-01-26 MED ORDER — PROPOFOL 10 MG/ML IV BOLUS
INTRAVENOUS | Status: DC | PRN
  Administered 2020-01-26: 30 mg via INTRAVENOUS
  Administered 2020-01-26: 20 mg via INTRAVENOUS
  Administered 2020-01-26: 30 mg via INTRAVENOUS

## 2020-01-26 MED ORDER — LACTATED RINGERS IV SOLN
INTRAVENOUS | Status: AC | PRN
  Administered 2020-01-26: 1000 mL via INTRAVENOUS

## 2020-01-26 MED ORDER — MUSCLE RUB 10-15 % EX CREA
TOPICAL_CREAM | CUTANEOUS | Status: DC | PRN
  Administered 2020-01-28: 1 via TOPICAL
  Filled 2020-01-26 (×2): qty 85

## 2020-01-26 MED ORDER — PHENYLEPHRINE 40 MCG/ML (10ML) SYRINGE FOR IV PUSH (FOR BLOOD PRESSURE SUPPORT)
PREFILLED_SYRINGE | INTRAVENOUS | Status: DC | PRN
  Administered 2020-01-26 (×2): 80 ug via INTRAVENOUS
  Administered 2020-01-26 (×3): 120 ug via INTRAVENOUS

## 2020-01-26 MED ORDER — PROPOFOL 500 MG/50ML IV EMUL
INTRAVENOUS | Status: DC | PRN
  Administered 2020-01-26: 100 ug/kg/min via INTRAVENOUS

## 2020-01-26 NOTE — Anesthesia Postprocedure Evaluation (Signed)
Anesthesia Post Note  Patient: [name]  Procedure(s) Performed: TRANSESOPHAGEAL ECHOCARDIOGRAM (TEE) (N/A ) CARDIOVERSION (N/A )     Patient location during evaluation: Endoscopy Anesthesia Type: MAC Level of consciousness: awake and alert Pain management: pain level controlled Vital Signs Assessment: post-procedure vital signs reviewed and stable Respiratory status: spontaneous breathing, nonlabored ventilation and respiratory function stable Cardiovascular status: blood pressure returned to baseline and stable Postop Assessment: no apparent nausea or vomiting Anesthetic complications: no   No complications documented.  Last Vitals:  Vitals:   01/26/20 1030 01/26/20 1135  BP: (!) 114/54 (!) 136/54  Pulse:  103  Resp: 20 21  Temp:  36.5 C  SpO2:  100%    Last Pain:  Vitals:   01/26/20 1135  TempSrc: Oral  PainSc:                  Lidia Collum

## 2020-01-26 NOTE — Progress Notes (Signed)
MEW Score has been yellow due to irregular HR, EKG 12 leads showed accerlarated  junctional rhythm, HR  110-125 depended on her ambulation. BP 99/50- 124/86 mmHg, RR 20-24, on 1 LPM of O2 NCL for comfort, Spo2 97-100%. Remained afebrile. Pain managed and tolerated well.   NPO AMN, consent signed and ready for TEE and cardioversion today at 8 am. Will continue to monitor.  Kennyth Lose, RN

## 2020-01-26 NOTE — Progress Notes (Signed)
Pharmacy: Dofetilide (Tikosyn) - Follow Up Assessment and Electrolyte Replacement  Pharmacy consulted to assist in monitoring and replacing electrolytes in this 74 y.o. female admitted on 01/17/2020 undergoing dofetilide re-initiation. First dofetilide dose: 249mcg  Labs:    Component Value Date/Time   K 4.4 01/26/2020 0348   MG 2.3 01/26/2020 0348     Plan: Potassium: K >/= 4: No additional supplementation needed  Magnesium: Mg > 2: No additional supplementation needed   Thank you for allowing pharmacy to participate in this patient's care   Amedeo Plenty 01/26/2020  10:04 AM

## 2020-01-26 NOTE — Progress Notes (Signed)
Physical Therapy Treatment Patient Details Name: [name] MRN: [id number] DOB: 1946/06/30 Today's Date: 01/26/2020    History of Present Illness Pt is a 74 y.o. female admitted 01/17/20 for minimally invasive MVR and maze procedure; required median sternotomy for repair of L ventricular free-wall laceration. Episode of afib with RVR and hypotension 7/21. S/p TEE, cardioversion 7/22. PMH includes severe scoliosis (s/p procedures), chronic pain, osteoporosis, arthritis, peripheral neuropathy, anxiety.   PT Comments    Pt slowly progressing with mobility. Today's session focused on transfer training and standing activity, pt requiring frequent cues to maintain sternal precautions; requires up to minA for mobility. Pt remains anxious regarding pain and mobility, but able to be redirected. Pt reports she will have necessary physical assist available from family upon return home. Continue to recommend HHPT services.  SpO2 95% on 2L O2, down to 83% on RA Resting BP 124/51, post-ambulation BP 115/67; HR 99   Follow Up Recommendations  Home health PT;Supervision/Assistance - 24 hour     Equipment Recommendations  Other (comment) (rollator)    Recommendations for Other Services       Precautions / Restrictions Precautions Precautions: Sternal;Fall Restrictions Other Position/Activity Restrictions: Sternal precautions    Mobility  Bed Mobility Overal bed mobility: Needs Assistance Bed Mobility: Supine to Sit     Supine to sit: Supervision;HOB elevated     General bed mobility comments: Use of bed rail with cues to maintain sternal precautions  Transfers Overall transfer level: Needs assistance Equipment used: Rolling walker (2 wheeled) Transfers: Sit to/from Stand Sit to Stand: Min assist         General transfer comment: MinA to assist trunk elevation, pt pulling with BUEs on RW requiring cues for sternal precautions  Ambulation/Gait Ambulation/Gait assistance: Min  guard Gait Distance (Feet): 64 Feet Assistive device: Rolling walker (2 wheeled) Gait Pattern/deviations: Step-through pattern;Decreased stride length;Trunk flexed Gait velocity: Decreased   General Gait Details: Amb 12' + 33' with RW and close min guard, 1x seated rest break to "get my bearings"; SpO2 95% on 2L O2, down to 83% on RA; HR 99   Stairs             Wheelchair Mobility    Modified Rankin (Stroke Patients Only)       Balance Overall balance assessment: Needs assistance Sitting-balance support: Bilateral upper extremity supported;Feet supported Sitting balance-Leahy Scale: Fair       Standing balance-Leahy Scale: Poor Standing balance comment: Reliant on UE support                            Cognition Arousal/Alertness: Awake/alert Behavior During Therapy: WFL for tasks assessed/performed;Anxious Overall Cognitive Status: No family/caregiver present to determine baseline cognitive functioning Area of Impairment: Orientation;Attention;Memory;Following commands;Safety/judgement;Awareness;Problem solving                 Orientation Level: Disoriented to;Situation Current Attention Level: Selective Memory: Decreased short-term memory;Decreased recall of precautions Following Commands: Follows one step commands consistently;Follows multi-step commands inconsistently Safety/Judgement: Decreased awareness of deficits Awareness: Emergent Problem Solving: Requires verbal cues General Comments: Pt initially asking, "are you here to take me (to cardioversion)" - reoriented she had procedure this morning; pt then able to tell me correct date, etc. Frequent cues for sternal precautions. Pt internally distracted and seems anxious regarding movement and vitals. Frequent reassurance and redirection to task      Exercises      General Comments General comments (skin integrity,  edema, etc.): Anxious regarding pain, but able to be redirected with  frequent encouragement. SpO2 95% on 2L O2, down to 83% on RA; HR 99; resting BP 124/51, post-session BP 115/67      Pertinent Vitals/Pain Pain Assessment: Faces Faces Pain Scale: Hurts little more Pain Location: R shoulder Pain Descriptors / Indicators: Grimacing;Guarding Pain Intervention(s): Monitored during session;Premedicated before session;Heat applied    Home Living                      Prior Function            PT Goals (current goals can now be found in the care plan section) Progress towards PT goals: Progressing toward goals    Frequency    Min 3X/week      PT Plan Current plan remains appropriate    Co-evaluation              AM-PAC PT "6 Clicks" Mobility   Outcome Measure  Help needed turning from your back to your side while in a flat bed without using bedrails?: A Little Help needed moving from lying on your back to sitting on the side of a flat bed without using bedrails?: A Little Help needed moving to and from a bed to a chair (including a wheelchair)?: A Little Help needed standing up from a chair using your arms (e.g., wheelchair or bedside chair)?: A Little Help needed to walk in hospital room?: A Little Help needed climbing 3-5 steps with a railing? : A Little 6 Click Score: 18    End of Session Equipment Utilized During Treatment: Gait belt Activity Tolerance: Patient tolerated treatment well Patient left: in chair;with call bell/phone within reach;with chair alarm set Nurse Communication: Mobility status PT Visit Diagnosis: Other abnormalities of gait and mobility (R26.89);Muscle weakness (generalized) (M62.81)     Time: 3810-1751 PT Time Calculation (min) (ACUTE ONLY): 24 min  Charges:  $Therapeutic Exercise: 8-22 mins $Therapeutic Activity: 8-22 mins                    Mabeline Caras, PT, DPT Acute Rehabilitation Services  Pager [phone] Office [place] 01/26/2020, 5:45 PM

## 2020-01-26 NOTE — Progress Notes (Signed)
  Echocardiogram Echocardiogram Transesophageal has been performed.  [name] 01/26/2020, 9:22 AM

## 2020-01-26 NOTE — Progress Notes (Addendum)
      [address]       [place] [address]             [phone]      Day of Surgery Procedure(s) (LRB): TRANSESOPHAGEAL ECHOCARDIOGRAM (TEE) (N/A) CARDIOVERSION (N/A) Subjective: Having a lot of pain this morning.   Objective: Vital signs in last 24 hours: Temp:  [97.8 F (36.6 C)-99.5 F (37.5 C)] 97.8 F (36.6 C) (07/22 0940) Pulse Rate:  [65-143] 91 (07/22 0925) Cardiac Rhythm: Atrial fibrillation (07/21 1946) Resp:  [17-30] 26 (07/22 0940) BP: (70-125)/(24-93) 121/63 (07/22 0940) SpO2:  [91 %-100 %] 94 % (07/22 0940) Weight:  [62 kg] 62 kg (07/22 6553)     Intake/Output from previous day: 07/21 0701 - 07/22 0700 In: 950 [P.O.:450; I.V.:500] Out: -  Intake/Output this shift: Total I/O In: 400 [I.V.:400] Out: -   General appearance: alert, cooperative and no distress Heart: sinus tachycardia Lungs: clear to auscultation bilaterally Abdomen: soft, non-tender; bowel sounds normal; no masses,  no organomegaly Extremities: 1-2+ pitting edema Wound: clean and dry  Lab Results: Recent Labs    01/25/20 0512 01/26/20 0348  WBC 7.9 10.2  HGB 9.7* 10.6*  HCT 32.0* 34.7*  PLT 156 219   BMET:  Recent Labs    01/25/20 0512 01/26/20 0348  NA 139 139  K 4.3 4.4  CL 102 103  CO2 30 25  GLUCOSE 103* 90  BUN 14 15  CREATININE 0.67 0.76  CALCIUM 7.5* 7.4*    PT/INR:  Recent Labs    01/26/20 0348  LABPROT 23.3*  INR 2.2*   ABG    Component Value Date/Time   PHART 7.235 (L) 01/18/2020 0106   HCO3 21.7 01/18/2020 0106   TCO2 27 01/19/2020 2206   ACIDBASEDEF 5.0 (H) 01/18/2020 0106   O2SAT 76.5 01/20/2020 0436   CBG (last 3)  No results for input(s): GLUCAP in the last 72 hours.  Assessment/Plan: S/P Procedure(s) (LRB): TRANSESOPHAGEAL ECHOCARDIOGRAM (TEE) (N/A) CARDIOVERSION (N/A)  1. S/p cardioversion this morning. Sinus tachycardia, rate in the 110s. BP stable. Continue Tikosyn and metoprolol 50mg  BID. 2. Pulm-  tolerating room air with good oxygen saturation 3. Renal-creatinine 0.76, electrolytes okay Potassium 4.4, Mag 2.3. Fluid overload. BP has been too low to give lasix.  4. Endo-blood glucose well controlled 5. INR 2.2. H and H stable at 10.6/34.7, 2mg  coumadin ordered for tonight. 6.  Having a lot of pain this morning. 10/10. Fentanyl patch ordered, oxycodone 8mg  oral liquid q 4 hours PRN, no IV Fentanyl due to hypotension.   Plan: Working on pain control this morning. She is very uncomfortable. OOB to chair. Ordered a heating pad for her shoulder.     LOS: 9 days    [name] 01/26/2020   I have seen and examined the patient and agree with the assessment and plan as outlined.  [name] is doing much better and maintaining NSR this evening.  In good spirits.  Possibly ready for d/c home 2-3 days if satisfactory arrangements for home health PT and nursing in place.  Discussed with patient and her sister at bedside.  Regina Alberts, MD 01/26/2020 5:42 PM

## 2020-01-26 NOTE — Transfer of Care (Signed)
Immediate Anesthesia Transfer of Care Note  Patient: [name]  Procedure(s) Performed: TRANSESOPHAGEAL ECHOCARDIOGRAM (TEE) (N/A ) CARDIOVERSION (N/A )  Patient Location: Endoscopy Unit  Anesthesia Type:General  Level of Consciousness: drowsy and patient cooperative  Airway & Oxygen Therapy: Patient Spontanous Breathing and Patient connected to nasal cannula oxygen  Post-op Assessment: Report given to RN, Post -op Vital signs reviewed and stable and Patient moving all extremities  Post vital signs: Reviewed and stable  Last Vitals:  Vitals Value Taken Time  BP    Temp    Pulse    Resp 31 01/26/20 0910  SpO2    Vitals shown include unvalidated device data.  Last Pain:  Vitals:   01/26/20 0747  TempSrc: Oral  PainSc: 0-No pain      Patients Stated Pain Goal: 2 (69/86/14 8307)  Complications: No complications documented.

## 2020-01-26 NOTE — Anesthesia Preprocedure Evaluation (Signed)
Anesthesia Evaluation  Patient identified by MRN, date of birth, ID band Patient awake    Reviewed: Allergy & Precautions, NPO status , Patient's Chart, lab work & pertinent test results, reviewed documented beta blocker date and time   History of Anesthesia Complications (+) PONV and history of anesthetic complications  Airway Mallampati: III  TM Distance: <3 FB Neck ROM: Limited    Dental   Pulmonary neg recent URI, former smoker,    Pulmonary exam normal        Cardiovascular hypertension, Pt. on home beta blockers and Pt. on medications + dysrhythmias Atrial Fibrillation + Valvular Problems/Murmurs (s/p mini mitral 01/17/20) MR  Rhythm:Irregular Rate:Normal + Systolic murmurs  1. Left ventricular ejection fraction, by estimation, is 60 to 65%. The  left ventricle has normal function. The left ventricle has no regional  wall motion abnormalities.  2. Right ventricular systolic function is normal. The right ventricular  size is normal.  3. Left atrial size was mildly dilated. No left atrial/left atrial  appendage thrombus was detected.  4. Flail A3 segment of anterior leaflet with very severe posteriorly  directed MR PISA radius 1.4 and diastolic predominence in PV;s . The  mitral valve is myxomatous. Severe mitral valve regurgitation. No evidence  of mitral stenosis.  5. The aortic valve is tricuspid. Aortic valve regurgitation is not  visualized.     Prox RCA lesion is 20% stenosed.  Dist LAD lesion is 20% stenosed.   1. Mild non-obstructive CAD  Recommendation: Continue planning for mitral valve repair.     Neuro/Psych neg Seizures PSYCHIATRIC DISORDERS Anxiety Depression  Neuromuscular disease    GI/Hepatic Neg liver ROS, GERD  Medicated and Controlled,  Endo/Other  negative endocrine ROS  Renal/GU negative Renal ROS     Musculoskeletal  (+) Arthritis ,   Abdominal   Peds  Hematology  (+)  Blood dyscrasia, anemia ,   Anesthesia Other Findings   Reproductive/Obstetrics                             Anesthesia Physical  Anesthesia Plan  ASA: IV  Anesthesia Plan: MAC   Post-op Pain Management:    Induction: Intravenous  PONV Risk Score and Plan: 3 and Propofol infusion, TIVA and Treatment may vary due to age or medical condition  Airway Management Planned: Natural Airway, Nasal Cannula and Simple Face Mask  Additional Equipment: None  Intra-op Plan:   Post-operative Plan:   Informed Consent: I have reviewed the patients History and Physical, chart, labs and discussed the procedure including the risks, benefits and alternatives for the proposed anesthesia with the patient or authorized representative who has indicated his/her understanding and acceptance.       Plan Discussed with:   Anesthesia Plan Comments:         Anesthesia Quick Evaluation

## 2020-01-26 NOTE — CV Procedure (Signed)
   TRANSESOPHAGEAL ECHOCARDIOGRAM GUIDED DIRECT CURRENT CARDIOVERSION  NAME:  [name]    MRN: [id number] DOB:  [birth year]    ADMIT DATE: 01/17/2020  INDICATIONS: Symptomatic atrial fibrillation  PROCEDURE:   Informed consent was obtained prior to the procedure. The risks, benefits and alternatives for the procedure were discussed and the patient comprehended these risks.  Risks include, but are not limited to, cough, sore throat, vomiting, nausea, somnolence, esophageal and stomach trauma or perforation, bleeding, low blood pressure, aspiration, pneumonia, infection, trauma to the teeth and death.    After a procedural time-out, the oropharynx was anesthetized and the patient was sedated by the anesthesia service. The transesophageal probe was inserted in the esophagus and stomach without difficulty and multiple views were obtained. Anesthesia was monitored by Dr. Kerin Perna.   COMPLICATIONS:    Complications: No complications Patient tolerated procedure well.  KEY FINDINGS:  1. S/p MV repair (34 mm annuloplasty) without evidence of regurgitation.  2. LAA clipping without residual connection to the LA. 3. Normal LVEF 60%. 4. Full Report to follow.   CARDIOVERSION:     Indications:  Symptomatic Atrial Fibrillation  Procedure Details:  Once the TEE was complete, the patient had the defibrillator pads placed in the anterior and posterior position. Once an appropriate level of sedation was confirmed, the patient was cardioverted x 1 with 200J of biphasic synchronized energy.  The patient converted to NSR.  There were no apparent complications.  The patient had normal neuro status and respiratory status post procedure with vitals stable as recorded elsewhere.  Adequate airway was maintained throughout and vital signs monitored per protocol.  270 mg propofol given.  L[place] Bells T. Audie Box, [place]  [address] [place], [place] [address] [phone]    8:58 AM

## 2020-01-26 NOTE — Interval H&P Note (Signed)
History and Physical Interval Note:  01/26/2020 7:52 AM  [name]  has presented today for surgery, with the diagnosis of afib.  The various methods of treatment have been discussed with the patient and family. After consideration of risks, benefits and other options for treatment, the patient has consented to  Procedure(s): TRANSESOPHAGEAL ECHOCARDIOGRAM (TEE) (N/A) CARDIOVERSION (N/A) as a surgical intervention.  The patient's history has been reviewed, patient examined, no change in status, stable for surgery.  I have reviewed the patient's chart and labs.  Questions were answered to the patient's satisfaction.     S/p MV repair, LAA clipping, and MAZE 7/13 with Dr. Roxy Manns. In Afib with RVR and very symptomatic. INR therapeutic. TEE/DCCV today.   [place] Bells T. Audie Box, [place]  [address] [place], [place] [address] [phone]  7:53 AM

## 2020-01-26 NOTE — Progress Notes (Addendum)
Electrophysiology Rounding Note  Patient Name: [name] Date of Encounter: 01/26/2020  Primary Cardiologist: No primary care provider on file. Electrophysiologist: Jaquille Kau Meredith Leeds, MD   Subjective   The patient is stable this am. Rates remains fast but BP improved. Pt remains very anxious.   Inpatient Medications    Scheduled Meds:  Chlorhexidine Gluconate Cloth  6 each Topical Daily   clonazePAM  0.5 mg Oral BID   dofetilide  250 mcg Oral BID   fentaNYL  1 patch Transdermal Q72H   ferrous [id number] C-folic acid  1 capsule Oral Q breakfast   magnesium oxide  200 mg Oral Daily   mouth rinse  15 mL Mouth Rinse BID   metoprolol tartrate  50 mg Oral BID   pantoprazole  40 mg Oral Daily   sodium chloride flush  10-40 mL Intracatheter Q12H   sodium chloride flush  3 mL Intravenous Q12H   warfarin  2 mg Oral q1600   Warfarin - Physician Dosing Inpatient   Does not apply q1600   Continuous Infusions:  sodium chloride 250 mL (01/25/20 1100)   lactated ringers     lactated ringers Stopped (01/20/20 0128)   PRN Meds: cyclobenzaprine, gabapentin, magic mouthwash, menthol-cetylpyridinium, metoprolol tartrate, ondansetron (ZOFRAN) IV, oxyCODONE, sodium chloride flush, sodium chloride flush   Vital Signs    Vitals:   01/26/20 0400 01/26/20 0500 01/26/20 0600 01/26/20 0633  BP: 104/64 (!) 99/56 (!) 102/50   Pulse: (!) 115 (!) 107 (!) 113 (!) 116  Resp:  20    Temp:      TempSrc:      SpO2: 97% 97%  96%  Weight:    62 kg  Height:        Intake/Output Summary (Last 24 hours) at 01/26/2020 0733 Last data filed at 01/25/2020 1946 Gross per 24 hour  Intake 950 ml  Output --  Net 950 ml   Filed Weights   01/21/20 0500 01/21/20 2212 01/26/20 0633  Weight: 64.3 kg 65.9 kg 62 kg    Physical Exam    GEN- The patient is anxious and fatigue appearing, alert and oriented x 3 today.   Head- normocephalic, atraumatic Eyes-  Sclera clear, conjunctiva pink Ears-  hearing intact Oropharynx- clear Neck- supple Lungs- Clear to ausculation bilaterally, normal work of breathing Heart- Irregular and rapid rate and rhythm, no murmurs, rubs or gallops GI- soft, NT, ND, + BS Extremities- no clubbing, cyanosis, or edema Skin- no rash or lesion Psych- euthymic mood, full affect Neuro- strength and sensation are intact  Labs    CBC Recent Labs    01/25/20 0512 01/26/20 0348  WBC 7.9 10.2  HGB 9.7* 10.6*  HCT 32.0* 34.7*  MCV 88.4 89.9  PLT 156 425   Basic Metabolic Panel Recent Labs    01/25/20 0512 01/26/20 0348  NA 139 139  K 4.3 4.4  CL 102 103  CO2 30 25  GLUCOSE 103* 90  BUN 14 15  CREATININE 0.67 0.76  CALCIUM 7.5* 7.4*  MG 2.3 2.3   Liver Function Tests No results for input(s): AST, ALT, ALKPHOS, BILITOT, PROT, ALBUMIN in the last 72 hours. No results for input(s): LIPASE, AMYLASE in the last 72 hours. Cardiac Enzymes No results for input(s): CKTOTAL, CKMB, CKMBINDEX, TROPONINI in the last 72 hours.   Telemetry    AF with RVR 110-130 (personally reviewed)  Radiology    No results found.  Patient Profile     Regina Metsker  [name] is a 74 y.o. female with a history of history of mitral regurgitation, recurrent paroxysmal atrial fibrillation on long-term anticoagulation, severe scoliosis for which she has undergone numerous surgical procedures, chronic pain on long-term oral narcotics, anxiety, osteoporosis, degenerative arthritis, GE reflux disease, and peripheral neuropathy seen for atrial fibrillation by request of Dr. Roxy Manns.  Assessment & Plan    1.  S/p MVR and MAZE procedure (minimally invasive) Post op course complicated by AF and RVR and post op anemia.    2. Paroxysmal AF with RVR / Atrial flutter Continue tikosyn 250 mcg BID Continue lopressor.  With rapid rates and intermittent Hypotension, plan for TEE/DCCV this am.  Yamari Ventola still plan close AF clinic follow up.   3. Post op anemia Confounded by dark stools and +  FOBT which has since resolved.  Her INR is therapeutic and she is ordered for coumadin tonight, personally discussed with Dr. Roxy Manns OK to proceed with TEE/DCCV  For questions or updates, please contact [facility] Please consult [email or web address] for contact info under Cardiology/STEMI.  Signed, Shirley Friar, PA-C  01/26/2020, 7:33 AM   I have seen and examined this patient with Oda Kilts.  Agree with above, note added to reflect my findings.  On exam, tachycardic, regular.  Patient went into atrial flutter and has had rapid rates with low blood pressure.  She got fluid bolus yesterday due to hypotension.  Is now status post TEE and cardioversion and in normal rhythm.  Would aggressively diuresis as this Lita Flynn likely help keep her in normal rhythm.  We Chanel Mckesson continue Tikosyn and anticoagulation with Coumadin.  Danyelle Brookover M. Kasidi Shanker MD 01/26/2020 11:19 AM

## 2020-01-26 NOTE — Anesthesia Preprocedure Evaluation (Deleted)
Anesthesia Evaluation  Patient identified by MRN, date of birth, ID band Patient awake    Reviewed: Allergy & Precautions, NPO status , Patient's Chart, lab work & pertinent test results, reviewed documented beta blocker date and time   History of Anesthesia Complications (+) PONV  Airway Mallampati: II  TM Distance: >3 FB Neck ROM: Limited    Dental  (+) Teeth Intact, Dental Advisory Given   Pulmonary former smoker,           Cardiovascular      Neuro/Psych    GI/Hepatic   Endo/Other    Renal/GU      Musculoskeletal   Abdominal   Peds  Hematology   Anesthesia Other Findings   Reproductive/Obstetrics                             Anesthesia Physical Anesthesia Plan  ASA: III  Anesthesia Plan: General   Post-op Pain Management:    Induction: Intravenous  PONV Risk Score and Plan: 2 and Propofol infusion  Airway Management Planned: Nasal Cannula  Additional Equipment: None  Intra-op Plan:   Post-operative Plan: Extubation in OR  Informed Consent: I have reviewed the patients History and Physical, chart, labs and discussed the procedure including the risks, benefits and alternatives for the proposed anesthesia with the patient or authorized representative who has indicated his/her understanding and acceptance.     Dental advisory given  Plan Discussed with: CRNA, Anesthesiologist and Surgeon  Anesthesia Plan Comments:         Anesthesia Quick Evaluation

## 2020-01-27 ENCOUNTER — Inpatient Hospital Stay: Payer: Self-pay

## 2020-01-27 ENCOUNTER — Encounter (HOSPITAL_COMMUNITY): Payer: Self-pay | Admitting: Cardiovascular Disease

## 2020-01-27 LAB — BASIC METABOLIC PANEL
Anion gap: 11 (ref 5–15)
BUN: 16 mg/dL (ref 8–23)
CO2: 30 mmol/L (ref 22–32)
Calcium: 7.6 mg/dL — ABNORMAL LOW (ref 8.9–10.3)
Chloride: 100 mmol/L (ref 98–111)
Creatinine, Ser: 0.66 mg/dL (ref 0.44–1.00)
GFR calc Af Amer: >60 mL/min (ref >60)
GFR calc non Af Amer: >60 mL/min (ref >60)
Glucose, Bld: 111 mg/dL — ABNORMAL HIGH (ref 70–99)
Potassium: 4.4 mmol/L (ref 3.5–5.1)
Sodium: 141 mmol/L (ref 135–145)

## 2020-01-27 LAB — PROTIME-INR
INR: 2.4 — ABNORMAL HIGH (ref 0.8–1.2)
Prothrombin Time: 25.4 seconds — ABNORMAL HIGH (ref 11.4–15.2)

## 2020-01-27 LAB — MAGNESIUM: Magnesium: 2.4 mg/dL (ref 1.7–2.4)

## 2020-01-27 LAB — TSH: TSH: 2.818 u[IU]/mL (ref 0.350–4.500)

## 2020-01-27 MED ORDER — FUROSEMIDE 10 MG/ML IJ SOLN
60.0000 mg | Freq: Two times a day (BID) | INTRAMUSCULAR | Status: DC
  Administered 2020-01-27 – 2020-01-29 (×6): 60 mg via INTRAVENOUS
  Filled 2020-01-27 (×6): qty 6

## 2020-01-27 MED ORDER — AMIODARONE LOAD VIA INFUSION
150.0000 mg | Freq: Once | INTRAVENOUS | Status: AC
  Administered 2020-01-27: 150 mg via INTRAVENOUS
  Filled 2020-01-27: qty 83.34

## 2020-01-27 MED ORDER — METOPROLOL TARTRATE 5 MG/5ML IV SOLN
5.0000 mg | Freq: Once | INTRAVENOUS | Status: AC
  Administered 2020-01-27: 5 mg via INTRAVENOUS

## 2020-01-27 MED ORDER — AMIODARONE HCL 200 MG PO TABS
400.0000 mg | ORAL_TABLET | Freq: Two times a day (BID) | ORAL | Status: DC
  Administered 2020-01-27: 400 mg via ORAL
  Filled 2020-01-27: qty 2

## 2020-01-27 MED ORDER — AMIODARONE HCL IN DEXTROSE 360-4.14 MG/200ML-% IV SOLN
60.0000 mg/h | INTRAVENOUS | Status: AC
  Administered 2020-01-27: 60 mg/h via INTRAVENOUS
  Filled 2020-01-27: qty 200

## 2020-01-27 MED ORDER — AMIODARONE HCL IN DEXTROSE 360-4.14 MG/200ML-% IV SOLN: 30.0000 mg/h | INTRAVENOUS | Status: AC

## 2020-01-27 NOTE — Progress Notes (Addendum)
[address]       [place] [address]             [phone]      1 Day Post-Op Procedure(s) (LRB): TRANSESOPHAGEAL ECHOCARDIOGRAM (TEE) (N/A) CARDIOVERSION (N/A) Subjective:  back in afib with RVR, EP to initiate IV amiodarone, will order PICC line placement  Objective: Vital signs in last 24 hours: Temp:  [97.7 F (36.5 C)-99 F (37.2 C)] 98.3 F (36.8 C) (07/23 0502) Pulse Rate:  [31-118] 118 (07/23 0502) Cardiac Rhythm: Atrial fibrillation (07/23 0808) Resp:  [18-40] 25 (07/23 0502) BP: (92-136)/(27-75) 109/75 (07/23 0502) SpO2:  [92 %-100 %] 95 % (07/23 0502) Weight:  [60.2 kg] 60.2 kg (07/23 0424)  Hemodynamic parameters for last 24 hours:    Intake/Output from previous day: 07/22 0701 - 07/23 0700 In: 400 [I.V.:400] Out: -  Intake/Output this shift: No intake/output data recorded.  General appearance: alert, cooperative and no distress Heart: irregularly irregular rhythm and tachy + JVP increased Lungs: dim in bases Abdomen: benign Extremities: + bilat pitting edema Wound: incis healing well  Lab Results: Recent Labs    01/25/20 0512 01/26/20 0348  WBC 7.9 10.2  HGB 9.7* 10.6*  HCT 32.0* 34.7*  PLT 156 219   BMET:  Recent Labs    01/26/20 0348 01/27/20 0413  NA 139 141  K 4.4 4.4  CL 103 100  CO2 25 30  GLUCOSE 90 111*  BUN 15 16  CREATININE 0.76 0.66  CALCIUM 7.4* 7.6*    PT/INR:  Recent Labs    01/27/20 0413  LABPROT 25.4*  INR 2.4*   ABG    Component Value Date/Time   PHART 7.235 (L) 01/18/2020 0106   HCO3 21.7 01/18/2020 0106   TCO2 27 01/19/2020 2206   ACIDBASEDEF 5.0 (H) 01/18/2020 0106   O2SAT 76.5 01/20/2020 0436   CBG (last 3)  No results for input(s): GLUCAP in the last 72 hours.  Meds Scheduled Meds: . Chlorhexidine Gluconate Cloth  6 each Topical Daily  . clonazePAM  0.5 mg Oral BID  . dofetilide  250 mcg Oral BID  . fentaNYL  1 patch Transdermal Q72H  . ferrous [id number]  C-folic acid  1 capsule Oral Q breakfast  . magnesium oxide  200 mg Oral Daily  . mouth rinse  15 mL Mouth Rinse BID  . metoprolol tartrate  50 mg Oral BID  . pantoprazole  40 mg Oral Daily  . sodium chloride flush  10-40 mL Intracatheter Q12H  . sodium chloride flush  3 mL Intravenous Q12H  . warfarin  2 mg Oral q1600  . Warfarin - Physician Dosing Inpatient   Does not apply q1600   Continuous Infusions: . sodium chloride 250 mL (01/25/20 1100)  . lactated ringers    . lactated ringers Stopped (01/20/20 0128)   PRN Meds:.cyclobenzaprine, gabapentin, magic mouthwash, menthol-cetylpyridinium, metoprolol tartrate, Muscle Rub, ondansetron (ZOFRAN) IV, oxyCODONE, sodium chloride flush, sodium chloride flush  Xrays ECHO TEE  Result Date: 01/26/2020    TRANSESOPHOGEAL ECHO REPORT   Patient Name:   [name] Date of Exam: 01/26/2020 Medical Rec #:  [id number]   Height:       66.0 in Accession #:    [id number]  Weight:       136.7 lb Date of Birth:  Apr 27, 1946   BSA:          1.701 m Patient Age:    74 years  BP:           118/66 mmHg Patient Gender: F           HR:           123 bpm. Exam Location:  Inpatient Procedure: TEE-Intraopertive and 3D Echo Indications:     atrial fibrillation  History:         Patient has prior history of Echocardiogram examinations, most                  recent 11/21/2019. Arrythmias:Atrial Fibrillation.                   Mitral Valve: 32 mm prosthetic annuloplasty ring valve is                  present in the mitral position. Procedure Date: 01/17/2020.  Sonographer:     Johny Chess Referring Phys:  [id number] Shirley Friar Diagnosing Phys: Eleonore Chiquito MD PROCEDURE: After discussion of the risks and benefits of a TEE, an informed consent was obtained from the patient. TEE procedure time was 20 minutes. The transesophogeal probe was passed without difficulty through the esophogus of the patient. Imaged were obtained with the patient in a left lateral decubitus  position. Local oropharyngeal anesthetic was provided with Cetacaine. Sedation performed by different physician. The patient was monitored while under deep sedation. Anesthestetic sedation was provided intravenously by Anesthesiology: 270mg  of Propofol. Image quality was excellent. The patient's vital signs; including heart rate, blood pressure, and oxygen saturation; remained stable throughout the procedure. The patient developed no complications during the procedure. A successful direct current cardioversion was performed at 200 joules with 1 attempt. IMPRESSIONS  1. Left ventricular ejection fraction, by estimation, is 55 to 60%. The left ventricle has normal function. The left ventricle has no regional wall motion abnormalities.  2. Right ventricular systolic function is normal. The right ventricular size is normal.  3. S/p surgical LAA clipping. No residual connection noted with the LA. No LA thrombus. No left atrial/left atrial appendage thrombus was detected.  4. 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 No residual MR. MVA by direct 3D MPR assessment 2.6 cm2. The mitral valve has been repaired/replaced. No evidence of mitral valve regurgitation. No evidence of mitral stenosis. The mean mitral valve gradient is 4.0 mmHg with average heart rate of 111 bpm. There is a 32 mm prosthetic annuloplasty ring present in the mitral position. Procedure Date: 01/17/2020.  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  6. The aortic valve is tricuspid. Aortic valve regurgitation is not visualized. No aortic stenosis is present. Conclusion(s)/Recommendation(s): No LA/LAA thrombus identified. Successful cardioversion performed with restoration of normal sinus rhythm. FINDINGS  Left Ventricle: Left ventricular ejection fraction, by estimation, is 55 to 60%. The left ventricle has normal function. The left ventricle has no regional wall motion abnormalities. The left ventricular internal cavity size was normal in size. There is  no  left ventricular hypertrophy. Right Ventricle: The right ventricular size is normal. No increase in right ventricular wall thickness. Right ventricular systolic function is normal. Left Atrium: S/p surgical LAA clipping. No residual connection noted with the LA. No LA thrombus. Left atrial size was normal in size. No left atrial/left atrial appendage thrombus was detected. Right Atrium: Right atrial size was normal in size. Pericardium: Trivial pericardial effusion is present. Mitral Valve: S/p MV repair with 32 mm annuloplasty ring. No residual MR. MVA by direct 3D MPR assessment 2.6 cm2. The mitral  valve has been repaired/replaced. No evidence of mitral valve regurgitation. There is a 32 mm prosthetic annuloplasty ring present in the mitral position. Procedure Date: 01/17/2020. No evidence of mitral valve stenosis. The mean mitral valve gradient is 4.0 mmHg with average heart rate of 111 bpm. Tricuspid Valve: The tricuspid valve is myxomatous. Tricuspid valve regurgitation is mild . No evidence of tricuspid stenosis. Aortic Valve: The aortic valve is tricuspid. Aortic valve regurgitation is not visualized. No aortic stenosis is present. Pulmonic Valve: The pulmonic valve was grossly normal. Pulmonic valve regurgitation is mild. No evidence of pulmonic stenosis. Aorta: The aortic root, ascending aorta, aortic arch and descending aorta are all structurally normal, with no evidence of dilitation or obstruction. Venous: The left upper pulmonary vein, left lower pulmonary vein and right upper pulmonary vein are normal. IAS/Shunts: There is redundancy of the interatrial septum. No atrial level shunt detected by color flow Doppler. Additional Comments: There is a small pleural effusion in the left lateral region.   AORTA Ao Root diam: 3.20 cm Ao Asc diam:  2.80 cm MITRAL VALVE           TRICUSPID VALVE MV Mean grad: 4.0 mmHg TR Peak grad:   25.0 mmHg                        TR Vmax:        250.00 cm/s Eleonore Chiquito MD  Electronically signed by Eleonore Chiquito MD Signature Date/Time: 01/26/2020/11:22:07 AM    Final     Assessment/Plan: S/P Procedure(s) (LRB): TRANSESOPHAGEAL ECHOCARDIOGRAM (TEE) (N/A) CARDIOVERSION (N/A)  1 back in afib, cardioversion yesterday. EP to start amiodarone and d/c tikosyn. Placed order for PICC which will be safer for infusion .  2 volume overload- EP has also increase lasix dose 3 creat in normal range 4 INR 2.4 which is pretty stable- cont current dose 5 pain and anxiety cont to limit some progress   LOS: 10 days    John Giovanni PA-C Pager [phone] 01/27/2020   I have seen and examined the patient and agree with the assessment and plan as outlined.  Stop Tikosyn and start amiodarone.  Rexene Alberts, MD 01/27/2020 3:17 PM

## 2020-01-27 NOTE — TOC Progression Note (Signed)
Transition of Care (TOC) - Progression Note  Marvetta Gibbons RN, BSN Transitions of Care Unit 4E- RN Case Manager See Treatment Team for direct phone #    Patient Details  Name: [name] MRN: [id number] Date of Birth: [birth year]  Transition of Care [facility]) CM/SW Contact  Dahlia Client Romeo Rabon, RN Phone Number: 01/27/2020, 12:22 PM  Clinical Narrative:    Pt s/p TEE cardioversion yesterday for afib, back in afib today. Sister at bedside- Eulogio Bear. Follow up done with pt and sister regarding orders for HHRN/PT- pt reluctantly agreed at this time with sister encouragement to go ahead and have [place] referral made- reviewed [facility] list with pt and sister- per pt she has used [facility] in past- and would like them again- sister request to be primary contact for [facility] scheduling- Eulogio Bear- [phone], sister to be staying with pt post discharge to assist.  Pt is agreeable at this time to a [facility] referral- [facility] is the agency of choice.  Call made to [facility] with [facility] for HHRN/PT referral- referral has been accepted.  TOC to continue to follow for any further transition needs.   Expected Discharge Plan: [place] Barriers to Discharge: Continued Medical Work up  Expected Discharge Plan and Services Expected Discharge Plan: [place]   Discharge Planning Services: CM Consult Post Acute Care Choice: [place] arrangements for the past 2 months: Single Family Home                           HH Arranged: RN, PT [facility] Agency: [place] ([facility]) Date [place]: 01/27/20 Time [place]: 1130 Representative spoke with at [place]: [place] ([place]) Interventions    Readmission Risk Interventions No flowsheet data found.

## 2020-01-27 NOTE — Care Management Important Message (Signed)
Important Message  Patient Details  Name: [name] MRN: [id number] Date of Birth: [birth year]   Medicare Important Message Given:  Yes     Shelda Altes 01/27/2020, 9:15 AM

## 2020-01-27 NOTE — Progress Notes (Signed)
Physical Therapy Treatment Patient Details Name: [name] MRN: [id number] DOB: Aug 29, 1945 Today's Date: 01/27/2020    History of Present Illness Pt is a 74 y.o. female admitted 01/17/20 for minimally invasive MVR and maze procedure; required median sternotomy for repair of L ventricular free-wall laceration. Episode of afib with RVR and hypotension 7/21. S/p TEE, cardioversion 7/22. PMH includes severe scoliosis (s/p procedures), chronic pain, osteoporosis, arthritis, peripheral neuropathy, anxiety.    PT Comments    Pt in afib rhythm this am, RN aware and approved pt mobility with PT. Pt required min assist overall for bed mobility, transfers, and short-distance ambulation. Pt with poor safety awareness and recall of sternal precautions this day, requiring multimodal cuing for application of precautions during mobility. Pt also with difficulty using the rollator safely, unsure if this is the appropriate AD for her post-acutely. Pt's sister present in room, concerned about pt being back in afib rhythm with HR ranging from 120s-145 bpm during session. PT feels pt may need ST-SNF post-acutely if mobility and safety awareness does not improve, will continue to follow and assess.    Follow Up Recommendations  Home health PT;Supervision/Assistance - 24 hour (vs SNF pending pt progress)     Equipment Recommendations  Other (comment) (rollator)    Recommendations for Other Services       Precautions / Restrictions Precautions Precautions: Sternal;Fall Precaution Booklet Issued: Yes (comment) Precaution Comments: Reviewed "move in the tube", appropriate hand placement especially when rising/sitting, moving from sidelying to sit. Pt with no recall of precautions from yesterday Restrictions Weight Bearing Restrictions: No    Mobility  Bed Mobility Overal bed mobility: Needs Assistance Bed Mobility: Rolling;Sidelying to Sit Rolling: Min guard Sidelying to sit: Min assist;HOB elevated        General bed mobility comments: Min assist for trunk elevation and scooting to EOB, verbal cuing for "move in the tube", pushing up to sit with use of elbow close to body.  Transfers Overall transfer level: Needs assistance Equipment used: 4-wheeled walker Transfers: Sit to/from Stand Sit to Stand: Min assist         General transfer comment: Min assist for power up, rise to full standing, and steadying. Pt with posterior leaning initially, with x1 LOB posteriorly requiring PT assist to correct. Pt quickly and unexpectedly sat back down due to fatigue, so STS x2 at EOB. Verbal cuing for placing hands on knees when rising/sitting for sternal protection.  Ambulation/Gait Ambulation/Gait assistance: Min assist Gait Distance (Feet): 15 Feet   Gait Pattern/deviations: Step-through pattern;Decreased stride length;Trunk flexed Gait velocity: decr   General Gait Details: Min assist to steady, physically maneuver rollator. SpO2 88-89% on RA and in afib, pt placed pt on 2LO2 during mobility. Pt in afib rhythm, HR 120s-145 bpm. Distance limited due to HR.   Stairs             Wheelchair Mobility    Modified Rankin (Stroke Patients Only)       Balance Overall balance assessment: Needs assistance Sitting-balance support: Feet supported Sitting balance-Leahy Scale: Fair Sitting balance - Comments: able to sit EOB without PT assist   Standing balance support: Bilateral upper extremity supported Standing balance-Leahy Scale: Poor Standing balance comment: Reliant on UE support                            Cognition Arousal/Alertness: Awake/alert Behavior During Therapy: WFL for tasks assessed/performed;Anxious Overall Cognitive Status: Impaired/Different from baseline Area of  Impairment: Attention;Memory;Following commands                   Current Attention Level: Sustained Memory: Decreased short-term memory;Decreased recall of precautions Following  Commands: Follows one step commands with increased time Safety/Judgement: Decreased awareness of safety;Decreased awareness of deficits Awareness: Emergent Problem Solving: Requires verbal cues;Requires tactile cues;Difficulty sequencing;Decreased initiation General Comments: Pt with anxiety limiting focus on task at hand, requires repeated verbal and tactile cuing for safe hand placement during mobility to maintain sternal precautions. Pt with no recall of sternal precautions from session yesterday, pt states "no, I don't think so" when asked if she had precautions. Poor safety awareness, moving to sitting quickly and without explanation when standing at EOB.      Exercises Other Exercises Other Exercises: Pt and family education: sternal precautions with mobility, use of rollator (locking/unlocking). Pt's sister [name] present for session    General Comments        Pertinent Vitals/Pain Pain Assessment: Faces Faces Pain Scale: Hurts little more Pain Location: R chest and shoulder from procedure Pain Descriptors / Indicators: Grimacing;Guarding Pain Intervention(s): Limited activity within patient's tolerance;Monitored during session;Repositioned    Home Living                      Prior Function            PT Goals (current goals can now be found in the care plan section) Acute Rehab PT Goals PT Goal Formulation: With patient Time For Goal Achievement: 02/03/20 Potential to Achieve Goals: Good Progress towards PT goals: Progressing toward goals    Frequency    Min 3X/week      PT Plan Current plan remains appropriate    Co-evaluation              AM-PAC PT "6 Clicks" Mobility   Outcome Measure  Help needed turning from your back to your side while in a flat bed without using bedrails?: A Little Help needed moving from lying on your back to sitting on the side of a flat bed without using bedrails?: A Little Help needed moving to and from a bed to a  chair (including a wheelchair)?: A Little Help needed standing up from a chair using your arms (e.g., wheelchair or bedside chair)?: A Little Help needed to walk in hospital room?: A Little Help needed climbing 3-5 steps with a railing? : A Lot 6 Click Score: 17    End of Session Equipment Utilized During Treatment: Gait belt Activity Tolerance: Patient limited by fatigue;Treatment limited secondary to medical complications (Comment) (afib with high HR) Patient left: in chair;with call bell/phone within reach;with family/visitor present Nurse Communication: Mobility status PT Visit Diagnosis: Other abnormalities of gait and mobility (R26.89);Muscle weakness (generalized) (M62.81)     Time: 5625-6389 PT Time Calculation (min) (ACUTE ONLY): 21 min  Charges:  $Therapeutic Activity: 8-22 mins                    Ajanee Buren E, PT Acute Rehabilitation Services Pager [phone]  Office [phone]   Janiah Devinney D Makeda Peeks 01/27/2020, 10:30 AM

## 2020-01-27 NOTE — Progress Notes (Addendum)
Electrophysiology Rounding Note  Patient Name: [name] Date of Encounter: 01/27/2020  Primary Cardiologist: No primary care provider on file. Electrophysiologist: Merton Wadlow Meredith Leeds, MD   Subjective   The patient remains tired and anxious. Having incisional and shoulder pain. well today.  At this time, the patient denies chest pain, shortness of breath, or any new concerns.  Inpatient Medications    Scheduled Meds:  amiodarone  150 mg Intravenous Once   Chlorhexidine Gluconate Cloth  6 each Topical Daily   clonazePAM  0.5 mg Oral BID   fentaNYL  1 patch Transdermal Q72H   ferrous [id number] C-folic acid  1 capsule Oral Q breakfast   furosemide  60 mg Intravenous BID   magnesium oxide  200 mg Oral Daily   mouth rinse  15 mL Mouth Rinse BID   metoprolol tartrate  50 mg Oral BID   pantoprazole  40 mg Oral Daily   sodium chloride flush  10-40 mL Intracatheter Q12H   sodium chloride flush  3 mL Intravenous Q12H   warfarin  2 mg Oral q1600   Warfarin - Physician Dosing Inpatient   Does not apply q1600   Continuous Infusions:  sodium chloride 250 mL (01/25/20 1100)   amiodarone 60 mg/hr (01/27/20 0953)   Followed by   amiodarone     lactated ringers     lactated ringers Stopped (01/20/20 0128)   PRN Meds: cyclobenzaprine, gabapentin, magic mouthwash, menthol-cetylpyridinium, metoprolol tartrate, Muscle Rub, ondansetron (ZOFRAN) IV, oxyCODONE, sodium chloride flush, sodium chloride flush   Vital Signs    Vitals:   01/27/20 0447 01/27/20 0500 01/27/20 0501 01/27/20 0502  BP: (!) 112/43 109/75 109/75 109/75  Pulse:  (!) 31 (!) 118 (!) 118  Resp: (!) 36 (!) 30 20 (!) 25  Temp:   98.3 F (36.8 C) 98.3 F (36.8 C)  TempSrc:   Oral Oral  SpO2:  93% 94% 95%  Weight:      Height:       No intake or output data in the 24 hours ending 01/27/20 0959 Filed Weights   01/21/20 2212 01/26/20 0633 01/27/20 0424  Weight: 65.9 kg 62 kg 60.2 kg    Physical Exam      GEN- The patient is fatigued and anxious, alert and oriented x 3 today.   Head- normocephalic, atraumatic Eyes-  Sclera clear, conjunctiva pink Ears- hearing intact Oropharynx- clear Neck- supple Lungs- Clear to ausculation bilaterally, normal work of breathing Heart- Rapid and irregular rate and rhythm, no murmurs, rubs or gallops GI- soft, NT, ND, + BS Extremities- no clubbing, cyanosis, or edema Skin- no rash or lesion Psych- euthymic mood, full affect Neuro- strength and sensation are intact  Labs    CBC Recent Labs    01/25/20 0512 01/26/20 0348  WBC 7.9 10.2  HGB 9.7* 10.6*  HCT 32.0* 34.7*  MCV 88.4 89.9  PLT 156 416   Basic Metabolic Panel Recent Labs    01/26/20 0348 01/27/20 0413  NA 139 141  K 4.4 4.4  CL 103 100  CO2 25 30  GLUCOSE 90 111*  BUN 15 16  CREATININE 0.76 0.66  CALCIUM 7.4* 7.6*  MG 2.3 2.4   Liver Function Tests No results for input(s): AST, ALT, ALKPHOS, BILITOT, PROT, ALBUMIN in the last 72 hours. No results for input(s): LIPASE, AMYLASE in the last 72 hours. Cardiac Enzymes No results for input(s): CKTOTAL, CKMB, CKMBINDEX, TROPONINI in the last 72 hours.   Telemetry  More AF with RVR overnight, currently rates in 130-140s (personally reviewed)  Radiology    ECHO TEE  Result Date: 01/26/2020    TRANSESOPHOGEAL ECHO REPORT   Patient Name:   [name] Date of Exam: 01/26/2020 Medical Rec #:  [id number]   Height:       66.0 in Accession #:    [id number]  Weight:       136.7 lb Date of Birth:  [birth year]   BSA:          1.701 m Patient Age:    74 years    BP:           118/66 mmHg Patient Gender: F           HR:           123 bpm. Exam Location:  Inpatient Procedure: TEE-Intraopertive and 3D Echo Indications:     atrial fibrillation  History:         Patient has prior history of Echocardiogram examinations, most                  recent 11/21/2019. Arrythmias:Atrial Fibrillation.                   Mitral Valve: 32 mm prosthetic  annuloplasty ring valve is                  present in the mitral position. Procedure Date: 01/17/2020.  Sonographer:     Johny Chess Referring Phys:  [id number] Shirley Friar Diagnosing Phys: Eleonore Chiquito MD PROCEDURE: After discussion of the risks and benefits of a TEE, an informed consent was obtained from the patient. TEE procedure time was 20 minutes. The transesophogeal probe was passed without difficulty through the esophogus of the patient. Imaged were obtained with the patient in a left lateral decubitus position. Local oropharyngeal anesthetic was provided with Cetacaine. Sedation performed by different physician. The patient was monitored while under deep sedation. Anesthestetic sedation was provided intravenously by Anesthesiology: 270mg  of Propofol. Image quality was excellent. The patient's vital signs; including heart rate, blood pressure, and oxygen saturation; remained stable throughout the procedure. The patient developed no complications during the procedure. A successful direct current cardioversion was performed at 200 joules with 1 attempt. IMPRESSIONS  1. Left ventricular ejection fraction, by estimation, is 55 to 60%. The left ventricle has normal function. The left ventricle has no regional wall motion abnormalities.  2. Right ventricular systolic function is normal. The right ventricular size is normal.  3. S/p surgical LAA clipping. No residual connection noted with the LA. No LA thrombus. No left atrial/left atrial appendage thrombus was detected.  4. 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 No residual MR. MVA by direct 3D MPR assessment 2.6 cm2. The mitral valve has been repaired/replaced. No evidence of mitral valve regurgitation. No evidence of mitral stenosis. The mean mitral valve gradient is 4.0 mmHg with average heart rate of 111 bpm. There is a 32 mm prosthetic annuloplasty ring present in the mitral position. Procedure Date: 01/17/2020.  5. The tricuspid valve is  myxomatous.  6. The aortic valve is tricuspid. Aortic valve regurgitation is not visualized. No aortic stenosis is present. Conclusion(s)/Recommendation(s): No LA/LAA thrombus identified. Successful cardioversion performed with restoration of normal sinus rhythm. FINDINGS  Left Ventricle: Left ventricular ejection fraction, by estimation, is 55 to 60%. The left ventricle has normal function. The left ventricle has no regional wall motion abnormalities. The left ventricular internal cavity  size was normal in size. There is  no left ventricular hypertrophy. Right Ventricle: The right ventricular size is normal. No increase in right ventricular wall thickness. Right ventricular systolic function is normal. Left Atrium: S/p surgical LAA clipping. No residual connection noted with the LA. No LA thrombus. Left atrial size was normal in size. No left atrial/left atrial appendage thrombus was detected. Right Atrium: Right atrial size was normal in size. Pericardium: Trivial pericardial effusion is present. Mitral Valve: S/p MV repair with 32 mm annuloplasty ring. No residual MR. MVA by direct 3D MPR assessment 2.6 cm2. The mitral valve has been repaired/replaced. No evidence of mitral valve regurgitation. There is a 32 mm prosthetic annuloplasty ring present in the mitral position. Procedure Date: 01/17/2020. No evidence of mitral valve stenosis. The mean mitral valve gradient is 4.0 mmHg with average heart rate of 111 bpm. Tricuspid Valve: The tricuspid valve is myxomatous. Tricuspid valve regurgitation is mild . No evidence of tricuspid stenosis. Aortic Valve: The aortic valve is tricuspid. Aortic valve regurgitation is not visualized. No aortic stenosis is present. Pulmonic Valve: The pulmonic valve was grossly normal. Pulmonic valve regurgitation is mild. No evidence of pulmonic stenosis. Aorta: The aortic root, ascending aorta, aortic arch and descending aorta are all structurally normal, with no evidence of dilitation  or obstruction. Venous: The left upper pulmonary vein, left lower pulmonary vein and right upper pulmonary vein are normal. IAS/Shunts: There is redundancy of the interatrial septum. No atrial level shunt detected by color flow Doppler. Additional Comments: There is a small pleural effusion in the left lateral region.   AORTA Ao Root diam: 3.20 cm Ao Asc diam:  2.80 cm MITRAL VALVE           TRICUSPID VALVE MV Mean grad: 4.0 mmHg TR Peak grad:   25.0 mmHg                        TR Vmax:        250.00 cm/s Eleonore Chiquito MD Electronically signed by Eleonore Chiquito MD Signature Date/Time: 01/26/2020/11:22:07 AM    Final    [place] EKG SITE RITE  Result Date: 01/27/2020 If Site Rite image not attached, placement could not be confirmed due to current cardiac rhythm.   Patient Profile     [name] is a 74 y.o. female with a history of history of mitral regurgitation, recurrent paroxysmal atrial fibrillation on long-term anticoagulation, severe scoliosis for which she has undergone numerous surgical procedures, chronic pain on long-term oral narcotics, anxiety, osteoporosis, degenerative arthritis, GE reflux disease, and peripheral neuropathy seen for atrial fibrillation by request of Dr. Roxy Manns.  Assessment & Plan    1.  S/p MVR and MAZE procedure (minimally invasive) Post op course complicated by AF and RVR and post op anemia.  Continue diuresis. Trevelle Mcgurn increase IV lasix to 60 mg BID.     2. Paroxysmal AF with RVR / Atrial flutter She has now had recurrent AF with RVR overnight s/p TEE/DCCV 01/26/2020. Reviewed patient chart and she has not been on amiodarone prior, but was concerned over side effects. We discussed and she is willing to try "anything to help" at this point.  We Giles Currie stop tikosyn and start IV amiodarone. OK for central access if TCTS would like.  Continue lopressor.  Abednego Yeates still plan close AF clinic follow up.    3. Post op anemia Confounded by dark stools and + FOBT This has resolved and  her  INR remains therapeutic.   Ethel Veronica transition to amiodarone and continue IV diuresis. If rates slow can go home on po amio after it is loaded. If remains unstable would re-attempt DCCV next week on amiodarone.   For questions or updates, please contact [facility] Please consult [email or web address] for contact info under Cardiology/STEMI.  Signed, Shirley Friar, PA-C  01/27/2020, 9:59 AM   I have seen and examined this patient with Oda Kilts.  Agree with above, note added to reflect my findings.  On exam, irregular, no murmurs. Patient had cardioversion yesterday but unfortunately back into AF this AM. Have stopped tikosyn and started amiodarone. Has converted on IV amiodarone. Florrie Ramires give 30 mg/hr in the next 6 hours then switch her to PO 400 mg BID.    Chantille Navarrete M. Vence Lalor MD 01/27/2020 10:47 AM

## 2020-01-27 NOTE — Progress Notes (Signed)
Amiodarone drip started per order Bp 89-16 systolic. Barrington Ellison PA-C paged and order received to 30mg /hr and continue to monitor.  Clyde Canterbury, RN

## 2020-01-27 NOTE — Progress Notes (Signed)
Pharmacy: Dofetilide (Tikosyn) - Follow Up Assessment and Electrolyte Replacement  Pharmacy consulted to assist in monitoring and replacing electrolytes in this 74 y.o. female admitted on 01/17/2020 undergoing dofetilide re-initiation  Labs:    Component Value Date/Time   K 4.4 01/27/2020 0413   MG 2.4 01/27/2020 0413     Plan: Potassium: K >/= 4: No additional supplementation needed  Magnesium: Mg > 2: No additional supplementation needed   Thank you Anette Guarneri, PharmD  01/27/2020  9:18 AM

## 2020-01-27 NOTE — Progress Notes (Addendum)
Confirmed with Barrington Ellison PA-C to stop amiodarone drip and pt will receive amiodarone PO at 2200 tonight. Pt still in NSR t this time. Will continue to monitor.  Clyde Canterbury, RN

## 2020-01-27 NOTE — Progress Notes (Signed)
  Pt converted to sinus brady -> sinus after amiodarone bolus and amiodarone gtt was started.   Noted to have hypotension in 70s.    Decrease amiodarone gtt to 30 mg/hr. Will finish current bag of amiodarone and plan on starting 400 mg BID po tonight.   Hold lasix this am until her pressure improves.   Pressure gradually improving with decreased rate amiodarone.   Discussed all above with [name].   Legrand Como [address] [place], PA-C  01/27/2020 10:58 AM

## 2020-01-27 NOTE — Progress Notes (Signed)
Patient in A fib in the 130-150's. Extremely anxious, in 10/10 pain. 5mg  IV Metoprolol given. HR still 120-140. EKG completed. BP 112/43. MD paged. Order received for an additional dose of 5mg  IV Metoprolol. BP 109/75. HR 117-120, previous baseline 110's. Patient resting comfortably.

## 2020-01-28 LAB — PROTIME-INR
INR: 2.3 — ABNORMAL HIGH (ref 0.8–1.2)
Prothrombin Time: 24.6 seconds — ABNORMAL HIGH (ref 11.4–15.2)

## 2020-01-28 LAB — MAGNESIUM: Magnesium: 1.9 mg/dL (ref 1.7–2.4)

## 2020-01-28 MED ORDER — DIGOXIN 125 MCG PO TABS: 0.2500 mg | ORAL_TABLET | Freq: Three times a day (TID) | ORAL | Status: DC

## 2020-01-28 MED ORDER — AMIODARONE HCL 200 MG PO TABS
400.0000 mg | ORAL_TABLET | Freq: Three times a day (TID) | ORAL | Status: DC
  Administered 2020-01-28 – 2020-02-02 (×16): 400 mg via ORAL
  Filled 2020-01-28 (×15): qty 2

## 2020-01-28 NOTE — Plan of Care (Signed)
Continue to monitor

## 2020-01-28 NOTE — Progress Notes (Addendum)
Electrophysiology Rounding Note  Patient Name: [name] Date of Encounter: 01/28/2020  Primary Cardiologist: No primary care provider on file. Electrophysiologist: Will Meredith Leeds, MD  Patient Profile     [name] is a 74 y.o. female with a history of history of mitral regurgitation, recurrent paroxysmal atrial fibrillation on long-term anticoagulation, severe scoliosis for which she has undergone numerous surgical procedures, chronic pain on long-term oral narcotics, anxiety, osteoporosis, degenerative arthritis, GE reflux disease, and peripheral neuropathy seen for atrial fibrillation following minimally invasive mitral valve repair and MAZE.  Complicated by left ventricular free wall laceration requiring an additional median sternotomy Previously on dofetilide.  2/2 breakthrough>> amio transiently in sinus and now back in A. fib  Subjective   Pleuritic chest pain largely unaware of her atrial fibrillation; i.e. no palpitations but quite short of breath  Inpatient Medications    Scheduled Meds: . amiodarone  400 mg Oral BID  . Chlorhexidine Gluconate Cloth  6 each Topical Daily  . clonazePAM  0.5 mg Oral BID  . fentaNYL  1 patch Transdermal Q72H  . ferrous [id number] C-folic acid  1 capsule Oral Q breakfast  . furosemide  60 mg Intravenous BID  . magnesium oxide  200 mg Oral Daily  . mouth rinse  15 mL Mouth Rinse BID  . metoprolol tartrate  50 mg Oral BID  . pantoprazole  40 mg Oral Daily  . sodium chloride flush  10-40 mL Intracatheter Q12H  . sodium chloride flush  3 mL Intravenous Q12H  . warfarin  2 mg Oral q1600  . Warfarin - Physician Dosing Inpatient   Does not apply q1600   Continuous Infusions: . sodium chloride 250 mL (01/25/20 1100)  . lactated ringers    . lactated ringers Stopped (01/20/20 0128)   PRN Meds: cyclobenzaprine, gabapentin, magic mouthwash, menthol-cetylpyridinium, metoprolol tartrate, Muscle Rub, ondansetron (ZOFRAN) IV,  oxyCODONE, sodium chloride flush, sodium chloride flush   Vital Signs    Vitals:   01/27/20 1940 01/27/20 2310 01/28/20 0222 01/28/20 0313  BP: (!) 111/43 (!) 89/61  (!) 105/62  Pulse: 100 84 91 96  Resp: 20 17 20 20   Temp: 98.3 F (36.8 C) 98.6 F (37 C)  (!) 97.5 F (36.4 C)  TempSrc: Oral Oral  Oral  SpO2: 99% 97% 97% 92%  Weight:   59.7 kg   Height:        Intake/Output Summary (Last 24 hours) at 01/28/2020 0802 Last data filed at 01/28/2020 0402 Gross per 24 hour  Intake 541.49 ml  Output 1400 ml  Net -858.51 ml   Filed Weights   01/26/20 0633 01/27/20 0424 01/28/20 0222  Weight: 62 kg 60.2 kg 59.7 kg    Physical Exam     Well developed and nourished modestly uncomfortable but no distress HENT normal Neck supple    Coarse breath sounds Rapid and irregular rate and rhythm Abd-soft with active BS No Clubbing cyanosis edema Skin-warm and dry A & Oriented  Grossly normal sensory and motor function     Labs    CBC Recent Labs    01/26/20 0348  WBC 10.2  HGB 10.6*  HCT 34.7*  MCV 89.9  PLT 035   Basic Metabolic Panel Recent Labs    01/26/20 0348 01/26/20 0348 01/27/20 0413 01/28/20 0432  NA 139  --  141  --   K 4.4  --  4.4  --   CL 103  --  100  --  CO2 25  --  30  --   GLUCOSE 90  --  111*  --   BUN 15  --  16  --   CREATININE 0.76  --  0.66  --   CALCIUM 7.4*  --  7.6*  --   MG 2.3   < > 2.4 1.9   < > = values in this interval not displayed.   Liver Function Tests No results for input(s): AST, ALT, ALKPHOS, BILITOT, PROT, ALBUMIN in the last 72 hours. No results for input(s): LIPASE, AMYLASE in the last 72 hours. Cardiac Enzymes No results for input(s): CKTOTAL, CKMB, CKMBINDEX, TROPONINI in the last 72 hours.   Telemetry    Personally reviewed demonstrated atrial fibrillation at variable rates  Radiology      Assessment & Plan    1.  S/p MVR and MAZE procedure (minimally invasive) complicated by left free wall laceration  requiring an additional median sternotomy; postoperative EF 55%      2. Paroxysmal AF with RVR / Atrial flutter   Patient is struggled with recurrent atrial arrhythmias.  Dofetilide was discontinued and amiodarone initiated.  We will continue oral loading; tolerating 400 twice daily so increased to 400 3 times daily   blood pressure is low precluding up titration of adjunctive rate control with beta-blockers.  W continue metoprolol.  Signed, Virl Axe, MD  01/28/2020, 8:02 AM

## 2020-01-28 NOTE — Progress Notes (Signed)
CARDIAC REHAB PHASE I   PRE:  Rate/Rhythm: 98 Afib  BP:  Supine: 98/58  Sitting: 91/66  Standing: 97/69   SaO2: 95 2L 86 room air  MODE:  Ambulation: 24  Ft in room   POST:  Rate/Rhythm: 116 Afib  BP:  Supine:   Sitting: 101/63  Standing:    SaO2: 93 2L 1330-1410 Assisted X 2 used walker, gait belt and O2 2L to ambulate. Pt c/o of constant pain in back  and with incision under left breast. Pt was able to get to standing position on her own. She was only able to walk 24 feet inside of room. Removed O2 prior to walk room air saturation dropped to 86%, so ambulated with O2. Pt to recliner after walk with call light in reach. Pt is very deconditioned and needs much encouragement to walk. This was our second attempt today to get her to ambulate.  Rodney Langton RN 01/28/2020 2:11 PM

## 2020-01-28 NOTE — Progress Notes (Signed)
Physical Therapy Treatment Patient Details Name: [name] MRN: [id number] DOB: 18-May-1946 Today's Date: 01/28/2020    History of Present Illness Pt is a 74 y.o. female admitted 01/17/20 for minimally invasive MVR and maze procedure; required median sternotomy for repair of L ventricular free-wall laceration. Episode of afib with RVR and hypotension 7/21. S/p TEE, cardioversion 7/22. PMH includes severe scoliosis (s/p procedures), chronic pain, osteoporosis, arthritis, peripheral neuropathy, anxiety.    PT Comments    RN reports pt had pain medication prior and had slept. Pt's sister requesting pain medication, however pt somewhat groggy and RN deferred administering pain meds until after pt worked with therapy. Pt supine in bed with Cardiac rehab taking orthostatic vitals. Pt agreeable to ambulation, but with c/o back and shoulder pain. Pt limited in safe mobility by decreased safety awareness particularly with sternal precautions, as well as decreased BP, increased HR and increased O2 demand (see  Cardiac Rehab notes) in presence of decreased strength balance and endurance. Pt requires min A for bed mobility, transfers and ambulation of 12 feet in room before feeling dizzy and request to return to recliner. D/c plans remain appropriate as long as cardiopulmonary response to activity is resolved. PT will continue to follow acutely.     Follow Up Recommendations  Home health PT;Supervision/Assistance - 24 hour (vs SNF pending pt progress)     Equipment Recommendations  Other (comment) (rollator)       Precautions / Restrictions Precautions Precautions: Sternal;Fall Precaution Booklet Issued: Yes (comment) Precaution Comments: Reviewed "move in the tube", appropriate hand placement especially when rising/sitting, moving from sidelying to sit. Pt with no recall of precautions from yesterday Restrictions Weight Bearing Restrictions: No Other Position/Activity Restrictions: Sternal  precautions    Mobility  Bed Mobility Overal bed mobility: Needs Assistance Bed Mobility: Rolling;Sidelying to Sit Rolling: Min guard Sidelying to sit: Min assist;HOB elevated       General bed mobility comments: min A for trunk elevation multimodal cues for hand placement for scooting to EoB  Transfers Overall transfer level: Needs assistance Equipment used: Rolling walker (2 wheeled) Transfers: Sit to/from Stand Sit to Stand: Min assist         General transfer comment: min A for power up and steadying, pt with increased posterior lean on EoB, requires assist to bring CoG over BoS   Ambulation/Gait Ambulation/Gait assistance: Min assist   Assistive device: Rolling walker (2 wheeled) Gait Pattern/deviations: Step-through pattern;Decreased stride length;Trunk flexed Gait velocity: decr   General Gait Details: Min assist to steady, physically maneuver rollator. SpO2 88-89% on RA and in afib, pt placed pt on 2LO2 during mobility. Pt in afib rhythm, HR 120s-145 bpm. Distance limited due to HR.       Balance Overall balance assessment: Needs assistance Sitting-balance support: Feet supported Sitting balance-Leahy Scale: Fair Sitting balance - Comments: able to sit EOB without PT assist   Standing balance support: Bilateral upper extremity supported Standing balance-Leahy Scale: Poor Standing balance comment: Reliant on UE support                            Cognition Arousal/Alertness: Awake/alert Behavior During Therapy: WFL for tasks assessed/performed;Anxious Overall Cognitive Status: Impaired/Different from baseline Area of Impairment: Attention;Memory;Following commands                 Orientation Level: Disoriented to;Situation Current Attention Level: Sustained Memory: Decreased short-term memory;Decreased recall of precautions Following Commands: Follows one step commands with  increased time Safety/Judgement: Decreased awareness of  safety;Decreased awareness of deficits Awareness: Emergent Problem Solving: Requires verbal cues;Requires tactile cues;Difficulty sequencing;Decreased initiation General Comments: pt continues to be anxious about falling and dying, requiring increased multimodal cuing for all tasks          General Comments General comments (skin integrity, edema, etc.): cardiac rehab present during session, assisted in collecting orthostatic BP data, please see their notes, BP low but not orthostatic, pt on 2L O2 via [place] with SaO2 in 90s, on RA SaO2 dropped to mid 80s, ambulated on 2L and able to maintain SaO2 >90%, max HR with ambulation 123 bpm, pt sister in room throughout session       Pertinent Vitals/Pain Pain Assessment: Faces Faces Pain Scale: Hurts little more Pain Location: R chest and shoulder from procedure Pain Descriptors / Indicators: Grimacing;Guarding Pain Intervention(s): Limited activity within patient's tolerance;Monitored during session;Repositioned           PT Goals (current goals can now be found in the care plan section) Acute Rehab PT Goals Patient Stated Goal: Manage pain complaints PT Goal Formulation: With patient Time For Goal Achievement: 02/03/20 Potential to Achieve Goals: Good Progress towards PT goals: Progressing toward goals    Frequency    Min 3X/week      PT Plan Current plan remains appropriate       AM-PAC PT "6 Clicks" Mobility   Outcome Measure  Help needed turning from your back to your side while in a flat bed without using bedrails?: A Little Help needed moving from lying on your back to sitting on the side of a flat bed without using bedrails?: A Little Help needed moving to and from a bed to a chair (including a wheelchair)?: A Little Help needed standing up from a chair using your arms (e.g., wheelchair or bedside chair)?: A Little Help needed to walk in hospital room?: A Little Help needed climbing 3-5 steps with a railing? : A Lot 6  Click Score: 17    End of Session Equipment Utilized During Treatment: Gait belt;Oxygen Activity Tolerance: Patient limited by pain;Other (comment) (complaints of dizziness) Patient left: in chair;with call bell/phone within reach;with family/visitor present Nurse Communication: Mobility status PT Visit Diagnosis: Other abnormalities of gait and mobility (R26.89);Muscle weakness (generalized) (M62.81)     Time: 3818-2993 PT Time Calculation (min) (ACUTE ONLY): 20 min  Charges:  $Gait Training: 8-22 mins                     Chazlyn Cude B. Migdalia Dk PT, DPT Acute Rehabilitation Services Pager [phone] Office [phone]    [place] 01/28/2020, 3:40 PM

## 2020-01-28 NOTE — Progress Notes (Addendum)
Patient up to bedside commode. Patient then ambulated in the room with walker about 20 ft. And to the chair.  HR 120's -140's. BP 107/64.  O2 dropped to low 80's without O2.  Patient placed back on 4 liters O2 with saturation back up to 94%. Patient appears to be in no distress and no complaints. Will continue to monitor.

## 2020-01-28 NOTE — Progress Notes (Addendum)
[address]       [place] [address]             [phone]      2 Days Post-Op Procedure(s) (LRB): TRANSESOPHAGEAL ECHOCARDIOGRAM (TEE) (N/A) CARDIOVERSION (N/A) Subjective: Anxious but stable Some afib/sinus tachy  Objective: Vital signs in last 24 hours: Temp:  [97.5 F (36.4 C)-98.6 F (37 C)] 98.1 F (36.7 C) (07/24 0810) Pulse Rate:  [76-113] 113 (07/24 0810) Cardiac Rhythm: Normal sinus rhythm (07/24 0223) Resp:  [17-20] 19 (07/24 0810) BP: (89-111)/(34-72) 99/67 (07/24 0810) SpO2:  [92 %-100 %] 97 % (07/24 0810) Weight:  [59.7 kg] 59.7 kg (07/24 0222)  Hemodynamic parameters for last 24 hours:    Intake/Output from previous day: 07/23 0701 - 07/24 0700 In: 781.5 [P.O.:600; I.V.:181.5] Out: 1400 [Urine:1400] Intake/Output this shift: No intake/output data recorded.  General appearance: alert, cooperative, no distress and anxious Heart: regular rate and rhythm and tachy Lungs: fairly clear  Abdomen: benign Extremities: + LE edema Wound: incis ealing well (healing)  Lab Results: Recent Labs    01/26/20 0348  WBC 10.2  HGB 10.6*  HCT 34.7*  PLT 219   BMET:  Recent Labs    01/26/20 0348 01/27/20 0413  NA 139 141  K 4.4 4.4  CL 103 100  CO2 25 30  GLUCOSE 90 111*  BUN 15 16  CREATININE 0.76 0.66  CALCIUM 7.4* 7.6*    PT/INR:  Recent Labs    01/28/20 0432  LABPROT 24.6*  INR 2.3*   ABG    Component Value Date/Time   PHART 7.235 (L) 01/18/2020 0106   HCO3 21.7 01/18/2020 0106   TCO2 27 01/19/2020 2206   ACIDBASEDEF 5.0 (H) 01/18/2020 0106   O2SAT 76.5 01/20/2020 0436   CBG (last 3)  No results for input(s): GLUCAP in the last 72 hours.  Meds Scheduled Meds: . amiodarone  400 mg Oral BID  . Chlorhexidine Gluconate Cloth  6 each Topical Daily  . clonazePAM  0.5 mg Oral BID  . fentaNYL  1 patch Transdermal Q72H  . ferrous [id number] C-folic acid  1 capsule Oral Q breakfast  . furosemide  60  mg Intravenous BID  . magnesium oxide  200 mg Oral Daily  . mouth rinse  15 mL Mouth Rinse BID  . metoprolol tartrate  50 mg Oral BID  . pantoprazole  40 mg Oral Daily  . sodium chloride flush  10-40 mL Intracatheter Q12H  . sodium chloride flush  3 mL Intravenous Q12H  . warfarin  2 mg Oral q1600  . Warfarin - Physician Dosing Inpatient   Does not apply q1600   Continuous Infusions: . sodium chloride 250 mL (01/25/20 1100)  . lactated ringers    . lactated ringers Stopped (01/20/20 0128)   PRN Meds:.cyclobenzaprine, gabapentin, magic mouthwash, menthol-cetylpyridinium, metoprolol tartrate, Muscle Rub, ondansetron (ZOFRAN) IV, oxyCODONE, sodium chloride flush, sodium chloride flush  Xrays ECHO TEE  Result Date: 01/26/2020    TRANSESOPHOGEAL ECHO REPORT   Patient Name:   [name] Date of Exam: 01/26/2020 Medical Rec #:  [id number]   Height:       66.0 in Accession #:    [id number]  Weight:       136.7 lb Date of Birth:  1946-02-27   BSA:          1.701 m Patient Age:    74 years    BP:  118/66 mmHg Patient Gender: F           HR:           123 bpm. Exam Location:  Inpatient Procedure: TEE-Intraopertive and 3D Echo Indications:     atrial fibrillation  History:         Patient has prior history of Echocardiogram examinations, most                  recent 11/21/2019. Arrythmias:Atrial Fibrillation.                   Mitral Valve: 32 mm prosthetic annuloplasty ring valve is                  present in the mitral position. Procedure Date: 01/17/2020.  Sonographer:     Johny Chess Referring Phys:  [id number] Shirley Friar Diagnosing Phys: Eleonore Chiquito MD PROCEDURE: After discussion of the risks and benefits of a TEE, an informed consent was obtained from the patient. TEE procedure time was 20 minutes. The transesophogeal probe was passed without difficulty through the esophogus of the patient. Imaged were obtained with the patient in a left lateral decubitus position. Local  oropharyngeal anesthetic was provided with Cetacaine. Sedation performed by different physician. The patient was monitored while under deep sedation. Anesthestetic sedation was provided intravenously by Anesthesiology: 270mg  of Propofol. Image quality was excellent. The patient's vital signs; including heart rate, blood pressure, and oxygen saturation; remained stable throughout the procedure. The patient developed no complications during the procedure. A successful direct current cardioversion was performed at 200 joules with 1 attempt. IMPRESSIONS  1. Left ventricular ejection fraction, by estimation, is 55 to 60%. The left ventricle has normal function. The left ventricle has no regional wall motion abnormalities.  2. Right ventricular systolic function is normal. The right ventricular size is normal.  3. S/p surgical LAA clipping. No residual connection noted with the LA. No LA thrombus. No left atrial/left atrial appendage thrombus was detected.  4. 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 No residual MR. MVA by direct 3D MPR assessment 2.6 cm2. The mitral valve has been repaired/replaced. No evidence of mitral valve regurgitation. No evidence of mitral stenosis. The mean mitral valve gradient is 4.0 mmHg with average heart rate of 111 bpm. There is a 32 mm prosthetic annuloplasty ring present in the mitral position. Procedure Date: 01/17/2020.  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  6. The aortic valve is tricuspid. Aortic valve regurgitation is not visualized. No aortic stenosis is present. Conclusion(s)/Recommendation(s): No LA/LAA thrombus identified. Successful cardioversion performed with restoration of normal sinus rhythm. FINDINGS  Left Ventricle: Left ventricular ejection fraction, by estimation, is 55 to 60%. The left ventricle has normal function. The left ventricle has no regional wall motion abnormalities. The left ventricular internal cavity size was normal in size. There is  no left ventricular  hypertrophy. Right Ventricle: The right ventricular size is normal. No increase in right ventricular wall thickness. Right ventricular systolic function is normal. Left Atrium: S/p surgical LAA clipping. No residual connection noted with the LA. No LA thrombus. Left atrial size was normal in size. No left atrial/left atrial appendage thrombus was detected. Right Atrium: Right atrial size was normal in size. Pericardium: Trivial pericardial effusion is present. Mitral Valve: S/p MV repair with 32 mm annuloplasty ring. No residual MR. MVA by direct 3D MPR assessment 2.6 cm2. The mitral valve has been repaired/replaced. No evidence of mitral valve regurgitation. There  is a 32 mm prosthetic annuloplasty ring present in the mitral position. Procedure Date: 01/17/2020. No evidence of mitral valve stenosis. The mean mitral valve gradient is 4.0 mmHg with average heart rate of 111 bpm. Tricuspid Valve: The tricuspid valve is myxomatous. Tricuspid valve regurgitation is mild . No evidence of tricuspid stenosis. Aortic Valve: The aortic valve is tricuspid. Aortic valve regurgitation is not visualized. No aortic stenosis is present. Pulmonic Valve: The pulmonic valve was grossly normal. Pulmonic valve regurgitation is mild. No evidence of pulmonic stenosis. Aorta: The aortic root, ascending aorta, aortic arch and descending aorta are all structurally normal, with no evidence of dilitation or obstruction. Venous: The left upper pulmonary vein, left lower pulmonary vein and right upper pulmonary vein are normal. IAS/Shunts: There is redundancy of the interatrial septum. No atrial level shunt detected by color flow Doppler. Additional Comments: There is a small pleural effusion in the left lateral region.   AORTA Ao Root diam: 3.20 cm Ao Asc diam:  2.80 cm MITRAL VALVE           TRICUSPID VALVE MV Mean grad: 4.0 mmHg TR Peak grad:   25.0 mmHg                        TR Vmax:        250.00 cm/s Eleonore Chiquito MD Electronically signed  by Eleonore Chiquito MD Signature Date/Time: 01/26/2020/11:22:07 AM    Final    [place] EKG SITE RITE  Result Date: 01/27/2020 If Site Rite image not attached, placement could not be confirmed due to current cardiac rhythm.   Assessment/Plan: S/P Procedure(s) (LRB): TRANSESOPHAGEAL ECHOCARDIOGRAM (TEE) (N/A) CARDIOVERSION (N/A)  1 sinus tachy currently 1 teens, on po amiodarone, metoprolol- management being guided per EP  2 some hypotension- limiting diuretic use for volume overload 3 INR stable at 2.3, cont coumadin 4 cont to  replace Mg++ for 1.9 5 cont to push rehab/pulm toilet as able 6 sats good on 2 liters- wean off as able    LOS: 11 days    John Giovanni PA-C Pager [phone] 01/28/2020  In afib now 90-100  inr 2.3 on coumadin Slow progress with PT I have seen and examined [name] and agree with the above assessment  and plan.  Grace Isaac MD Beeper [phone] Office [phone] 01/28/2020 1:05 PM

## 2020-01-29 LAB — PROTIME-INR
INR: 2.5 — ABNORMAL HIGH (ref 0.8–1.2)
Prothrombin Time: 26.2 seconds — ABNORMAL HIGH (ref 11.4–15.2)

## 2020-01-29 LAB — MAGNESIUM: Magnesium: 1.9 mg/dL (ref 1.7–2.4)

## 2020-01-29 MED ORDER — OXYCODONE HCL 5 MG/5ML PO SOLN
5.0000 mg | ORAL | Status: DC | PRN
  Administered 2020-01-29 – 2020-02-02 (×18): 5 mg via ORAL
  Filled 2020-01-29 (×18): qty 5

## 2020-01-29 NOTE — Progress Notes (Signed)
Electrophysiology Rounding Note  Patient Name: [name] Date of Encounter: 01/29/2020  Primary Cardiologist: No primary care provider on file. Electrophysiologist: Will Meredith Leeds, MD  Patient Profile     [name] is a 74 y.o. female with a history of history of mitral regurgitation, recurrent paroxysmal atrial fibrillation on long-term anticoagulation, severe scoliosis for which she has undergone numerous surgical procedures, chronic pain on long-term oral narcotics, anxiety, osteoporosis, degenerative arthritis, GE reflux disease, and peripheral neuropathy seen for atrial fibrillation following minimally invasive mitral valve repair and MAZE.  Complicated by left ventricular free wall laceration requiring an additional median sternotomy LV function normal Previously on dofetilide.  2/2 breakthrough>> amio transiently in sinus and now back in A. fib  Subjective   Feels lousy.  Poorly able to characterize that.  Denies shortness of breath.  But some chest pain. Inpatient Medications    Scheduled Meds: . amiodarone  400 mg Oral Q8H  . Chlorhexidine Gluconate Cloth  6 each Topical Daily  . clonazePAM  0.5 mg Oral BID  . fentaNYL  1 patch Transdermal Q72H  . ferrous [id number] C-folic acid  1 capsule Oral Q breakfast  . furosemide  60 mg Intravenous BID  . magnesium oxide  200 mg Oral Daily  . mouth rinse  15 mL Mouth Rinse BID  . metoprolol tartrate  50 mg Oral BID  . pantoprazole  40 mg Oral Daily  . sodium chloride flush  10-40 mL Intracatheter Q12H  . sodium chloride flush  3 mL Intravenous Q12H  . warfarin  2 mg Oral q1600  . Warfarin - Physician Dosing Inpatient   Does not apply q1600   Continuous Infusions: . sodium chloride 250 mL (01/25/20 1100)  . lactated ringers    . lactated ringers Stopped (01/20/20 0128)   PRN Meds: cyclobenzaprine, gabapentin, magic mouthwash, menthol-cetylpyridinium, metoprolol tartrate, Muscle Rub, ondansetron (ZOFRAN)  IV, oxyCODONE, sodium chloride flush, sodium chloride flush   Vital Signs    Vitals:   01/29/20 0355 01/29/20 0825 01/29/20 1015 01/29/20 1110  BP: (!) 98/63 (!) 95/52  (!) 95/59  Pulse: (!) 107 (!) 120 98 95  Resp: 19 (!) 34 18 20  Temp: 97.6 F (36.4 C) 98 F (36.7 C)  (!) 96.4 F (35.8 C)  TempSrc: Oral Oral  Oral  SpO2: 100% 100% 97% 98%  Weight: 61.7 kg     Height:        Intake/Output Summary (Last 24 hours) at 01/29/2020 1459 Last data filed at 01/29/2020 1221 Gross per 24 hour  Intake 120 ml  Output 701 ml  Net -581 ml   Filed Weights   01/27/20 0424 01/28/20 0222 01/29/20 0355  Weight: 60.2 kg 59.7 kg 61.7 kg    Physical Exam    Cachectic and thin female in mild respiratory distress wearing oxygen  HENT normal Neck supple with JVP  Clear Rapid and irregular rate and rhythm, no murmurs or gallops Abd-soft with active BS No Clubbing cyanosis edema Skin-warm and dry A    Grossly normal sensory and motor function  Asks the same questions over and over and over again.  Repeats the answer and then reiterates the question     Labs    CBC No results for input(s): WBC, NEUTROABS, HGB, HCT, MCV, PLT in the last 72 hours. Basic Metabolic Panel Recent Labs    01/27/20 0413 01/27/20 0413 01/28/20 0432 01/29/20 0357  NA 141  --   --   --  K 4.4  --   --   --   CL 100  --   --   --   CO2 30  --   --   --   GLUCOSE 111*  --   --   --   BUN 16  --   --   --   CREATININE 0.66  --   --   --   CALCIUM 7.6*  --   --   --   MG 2.4   < > 1.9 1.9   < > = values in this interval not displayed.   Liver Function Tests No results for input(s): AST, ALT, ALKPHOS, BILITOT, PROT, ALBUMIN in the last 72 hours. No results for input(s): LIPASE, AMYLASE in the last 72 hours. Cardiac Enzymes No results for input(s): CKTOTAL, CKMB, CKMBINDEX, TROPONINI in the last 72 hours.   Telemetry    Personally reviewed demonstrated atrial fibrillation at variable  rates  Radiology      Assessment & Plan    S/p MVR and MAZE procedure (minimally invasive) complicated by left free wall laceration requiring an additional median sternotomy; postoperative EF 55%    Paroxysmal AF with RVR / Atrial flutter  Hypotension  Confusion   Amiodarone started 7/23.  Would anticipate cardioversion Tuesday 7/27.  If she were to revert to atrial fibrillation thereafter, would initiate digoxin for augmented rate control with anticipated repeat cardioversion about 2 weeks later.  Low blood pressure limits other AV nodal blocking agents.  Confusion persists   Signed, Virl Axe, MD  01/29/2020, 2:59 PM

## 2020-01-29 NOTE — Plan of Care (Signed)
Continue to monitor

## 2020-01-29 NOTE — Progress Notes (Addendum)
[address]       [place] [address]             [phone]      3 Days Post-Op Procedure(s) (LRB): TRANSESOPHAGEAL ECHOCARDIOGRAM (TEE) (N/A) CARDIOVERSION (N/A) Subjective: Feels a little better this am, had a "bad night" with pain  Objective: Vital signs in last 24 hours: Temp:  [97.6 F (36.4 C)-99.7 F (37.6 C)] 98 F (36.7 C) (07/25 0825) Pulse Rate:  [97-128] 120 (07/25 0825) Cardiac Rhythm: Atrial fibrillation;Bundle branch block (07/25 0840) Resp:  [18-34] 34 (07/25 0825) BP: (90-107)/(52-70) 95/52 (07/25 0825) SpO2:  [94 %-100 %] 100 % (07/25 0825) Weight:  [61.7 kg] 61.7 kg (07/25 0355)  Hemodynamic parameters for last 24 hours:    Intake/Output from previous day: 07/24 0701 - 07/25 0700 In: 320 [P.O.:320] Out: 850 [Urine:850] Intake/Output this shift: Total I/O In: -  Out: 300 [Urine:300]  General appearance: alert, cooperative and no distress Heart: irregularly irregular rhythm Lungs: fairly clear Abdomen: benign Extremities: + edema Wound: incis healing well  Lab Results: No results for input(s): WBC, HGB, HCT, PLT in the last 72 hours. BMET:  Recent Labs    01/27/20 0413  NA 141  K 4.4  CL 100  CO2 30  GLUCOSE 111*  BUN 16  CREATININE 0.66  CALCIUM 7.6*    PT/INR:  Recent Labs    01/29/20 0357  LABPROT 26.2*  INR 2.5*   ABG    Component Value Date/Time   PHART 7.235 (L) 01/18/2020 0106   HCO3 21.7 01/18/2020 0106   TCO2 27 01/19/2020 2206   ACIDBASEDEF 5.0 (H) 01/18/2020 0106   O2SAT 76.5 01/20/2020 0436   CBG (last 3)  No results for input(s): GLUCAP in the last 72 hours.  Meds Scheduled Meds: . amiodarone  400 mg Oral Q8H  . Chlorhexidine Gluconate Cloth  6 each Topical Daily  . clonazePAM  0.5 mg Oral BID  . fentaNYL  1 patch Transdermal Q72H  . ferrous [id number] C-folic acid  1 capsule Oral Q breakfast  . furosemide  60 mg Intravenous BID  . magnesium oxide  200 mg Oral Daily   . mouth rinse  15 mL Mouth Rinse BID  . metoprolol tartrate  50 mg Oral BID  . pantoprazole  40 mg Oral Daily  . sodium chloride flush  10-40 mL Intracatheter Q12H  . sodium chloride flush  3 mL Intravenous Q12H  . warfarin  2 mg Oral q1600  . Warfarin - Physician Dosing Inpatient   Does not apply q1600   Continuous Infusions: . sodium chloride 250 mL (01/25/20 1100)  . lactated ringers    . lactated ringers Stopped (01/20/20 0128)   PRN Meds:.cyclobenzaprine, gabapentin, magic mouthwash, menthol-cetylpyridinium, metoprolol tartrate, Muscle Rub, ondansetron (ZOFRAN) IV, oxyCODONE, sodium chloride flush, sodium chloride flush  Xrays [place] EKG SITE RITE  Result Date: 01/27/2020 If Site Rite image not attached, placement could not be confirmed due to current cardiac rhythm.   Assessment/Plan: S/P Procedure(s) (LRB): TRANSESOPHAGEAL ECHOCARDIOGRAM (TEE) (N/A) CARDIOVERSION (N/A)  1 stable in afib with RVR, conts po amio load, INR is therapeutic(2.5) 2 SBP runs low 90-100's 3 diuresing ok, some volume overload  4 tmax 99.7 5 replacing MG++ 6 sats good on 3 liters- wean as able, routine pulm toilet 7 push therapies as able- remains very weak 8 repeat labs in am  LOS: 12 days    John Giovanni PA-C Pager [phone] 01/29/2020  Up in chair, nursing reports walked some this am I have seen and examined [name] and agree with the above assessment  and plan.  Grace Isaac MD Beeper [phone] Office [phone] 01/29/2020 2:12 PM

## 2020-01-30 LAB — BASIC METABOLIC PANEL
Anion gap: 11 (ref 5–15)
BUN: 16 mg/dL (ref 8–23)
CO2: 36 mmol/L — ABNORMAL HIGH (ref 22–32)
Calcium: 7.7 mg/dL — ABNORMAL LOW (ref 8.9–10.3)
Chloride: 91 mmol/L — ABNORMAL LOW (ref 98–111)
Creatinine, Ser: 0.79 mg/dL (ref 0.44–1.00)
GFR calc Af Amer: >60 mL/min (ref >60)
GFR calc non Af Amer: >60 mL/min (ref >60)
Glucose, Bld: 110 mg/dL — ABNORMAL HIGH (ref 70–99)
Potassium: 3.4 mmol/L — ABNORMAL LOW (ref 3.5–5.1)
Sodium: 138 mmol/L (ref 135–145)

## 2020-01-30 LAB — PROTIME-INR
INR: 2.8 — ABNORMAL HIGH (ref 0.8–1.2)
Prothrombin Time: 28.7 seconds — ABNORMAL HIGH (ref 11.4–15.2)

## 2020-01-30 LAB — CBC
HCT: 30.6 % — ABNORMAL LOW (ref 36.0–46.0)
Hemoglobin: 9.3 g/dL — ABNORMAL LOW (ref 12.0–15.0)
MCH: 26.8 pg (ref 26.0–34.0)
MCHC: 30.4 g/dL (ref 30.0–36.0)
MCV: 88.2 fL (ref 80.0–100.0)
Platelets: 278 10*3/uL (ref 150–400)
RBC: 3.47 MIL/uL — ABNORMAL LOW (ref 3.87–5.11)
RDW: 21 % — ABNORMAL HIGH (ref 11.5–15.5)
WBC: 10.9 10*3/uL — ABNORMAL HIGH (ref 4.0–10.5)
nRBC: 0 % (ref 0.0–0.2)

## 2020-01-30 LAB — MAGNESIUM: Magnesium: 1.7 mg/dL (ref 1.7–2.4)

## 2020-01-30 MED ORDER — POTASSIUM CHLORIDE CRYS ER 20 MEQ PO TBCR
40.0000 meq | EXTENDED_RELEASE_TABLET | Freq: Once | ORAL | Status: AC
  Administered 2020-01-30: 40 meq via ORAL
  Filled 2020-01-30: qty 2

## 2020-01-30 MED ORDER — POTASSIUM CHLORIDE CRYS ER 20 MEQ PO TBCR
60.0000 meq | EXTENDED_RELEASE_TABLET | Freq: Once | ORAL | Status: AC
  Administered 2020-01-30: 60 meq via ORAL
  Filled 2020-01-30: qty 3

## 2020-01-30 MED ORDER — POTASSIUM CHLORIDE CRYS ER 20 MEQ PO TBCR
20.0000 meq | EXTENDED_RELEASE_TABLET | Freq: Every day | ORAL | Status: DC
  Administered 2020-01-31 – 2020-02-02 (×3): 20 meq via ORAL
  Filled 2020-01-30 (×3): qty 1

## 2020-01-30 MED ORDER — FUROSEMIDE 40 MG PO TABS
40.0000 mg | ORAL_TABLET | Freq: Every day | ORAL | Status: DC
  Administered 2020-01-30 – 2020-02-02 (×4): 40 mg via ORAL
  Filled 2020-01-30 (×4): qty 1

## 2020-01-30 MED ORDER — MAGNESIUM SULFATE 4 GM/100ML IV SOLN
4.0000 g | Freq: Once | INTRAVENOUS | Status: AC
  Administered 2020-01-30: 4 g via INTRAVENOUS
  Filled 2020-01-30: qty 100

## 2020-01-30 MED ORDER — WARFARIN SODIUM 1 MG PO TABS
1.0000 mg | ORAL_TABLET | Freq: Every day | ORAL | Status: DC
  Administered 2020-01-30: 1 mg via ORAL
  Filled 2020-01-30: qty 1

## 2020-01-30 NOTE — Progress Notes (Signed)
Pt ambulated approximately 159ft with one assist and front wheel walker on 2L O2.

## 2020-01-30 NOTE — Progress Notes (Signed)
Occupational Therapy Treatment Patient Details Name: [name] MRN: [id number] DOB: [birth year] Today's Date: 01/30/2020    History of present illness Pt is a 74 y.o. female admitted 01/17/20 for minimally invasive MVR and maze procedure; required median sternotomy for repair of L ventricular free-wall laceration. Episode of afib with RVR and hypotension 7/21. S/p TEE, cardioversion 7/22. PMH includes severe scoliosis (s/p procedures), chronic pain, osteoporosis, arthritis, peripheral neuropathy, anxiety.   OT comments  Pt seated EOB upon arrival having just completed hallway distance mobility with RN assist, agreeable to working with OT. Pt performing ADL seated EOB with setup/supervision given increased time/cues to perform. Pt requiring minA for functional transfers including sit<>stand and bed mobility. She continues to have poor recall of sternal precautions with OT providing education/review throughout session. VSS throughout. Feel current POC remains appropriate pending pt has available 24hr assist given current cognitive level and decreased carryover of sternal precautions. Will continue to follow while pt remains acutely admitted to progress towards established OT goals.   Follow Up Recommendations  Home health OT;Supervision/Assistance - 24 hour (pending progress and availability of 24hr)    Equipment Recommendations  3 in 1 bedside commode          Precautions / Restrictions Precautions Precautions: Sternal;Fall Precaution Comments: verbally reviewed/educated during session Restrictions Other Position/Activity Restrictions: Sternal precautions       Mobility Bed Mobility Overal bed mobility: Needs Assistance Bed Mobility: Rolling;Sidelying to Sit;Sit to Sidelying Rolling: Min guard Sidelying to sit: Min assist;HOB elevated     Sit to sidelying: Min assist General bed mobility comments: assist for trunk elevation and for LEs when returning to bed   Transfers Overall  transfer level: Needs assistance Equipment used: Rolling walker (2 wheeled);1 person hand held assist Transfers: Sit to/from Stand Sit to Stand: Min assist         General transfer comment: min A for power up and steadying, VCs for safe hand placement with transitions     Balance Overall balance assessment: Needs assistance Sitting-balance support: Feet supported Sitting balance-Leahy Scale: Fair Sitting balance - Comments: able to sit EOB with supervision   Standing balance support: Bilateral upper extremity supported Standing balance-Leahy Scale: Poor Standing balance comment: Reliant on UE support                           ADL either performed or assessed with clinical judgement   ADL Overall ADL's : Needs assistance/impaired     Grooming: Oral care;Set up;Supervision/safety;Sitting Grooming Details (indicate cue type and reason): seated EOB as pt just completed hallway mobility task with RN and pt fatigued - pt requiring increased time to perform and intermittent cues to ensure proper carryover                             Functional mobility during ADLs: Minimal assistance;Cueing for safety                         Cognition Arousal/Alertness: Awake/alert Behavior During Therapy: [facility] for tasks assessed/performed;Anxious Overall Cognitive Status: Impaired/Different from baseline Area of Impairment: Attention;Memory;Following commands                   Current Attention Level: Sustained Memory: Decreased short-term memory;Decreased recall of precautions Following Commands: Follows one step commands consistently;Follows one step commands with increased time Safety/Judgement: Decreased awareness of safety;Decreased awareness of deficits  Awareness: Emergent Problem Solving: Requires verbal cues General Comments: continues to have decreased recall of sternal precautions, cues/encouragement to proceed through oral care task         Exercises     Shoulder Instructions       General Comments VSS on 3L, HR up to 110 with activity    Pertinent Vitals/ Pain       Pain Assessment: Faces Faces Pain Scale: Hurts little more Pain Location: reports "chronic pain" Pain Descriptors / Indicators: Discomfort Pain Intervention(s): Monitored during session;Repositioned  Home Living                                          Prior Functioning/Environment              Frequency  Min 2X/week        Progress Toward Goals  OT Goals(current goals can now be found in the care plan section)  Progress towards OT goals: Progressing toward goals  Acute Rehab OT Goals Patient Stated Goal: Manage pain complaints OT Goal Formulation: With patient Time For Goal Achievement: 02/05/20 Potential to Achieve Goals: Good ADL Goals Pt Will Perform Lower Body Dressing: with supervision;sitting/lateral leans;sit to/from stand Pt Will Transfer to Toilet: with supervision;ambulating;bedside commode Pt/caregiver will Perform Home Exercise Program: Increased strength;Both right and left upper extremity Additional ADL Goal #1: Pt will state sternal precautions with no verbal cues to assist in order to ensure safety for d/c home. Additional ADL Goal #2: Pt will increase to supervisionA for OOB ADL tasks with O2 sats >90%.  Plan Discharge plan remains appropriate    Co-evaluation                 AM-PAC OT "6 Clicks" Daily Activity     Outcome Measure   Help from another person eating meals?: None Help from another person taking care of personal grooming?: A Little Help from another person toileting, which includes using toliet, bedpan, or urinal?: A Lot Help from another person bathing (including washing, rinsing, drying)?: A Lot Help from another person to put on and taking off regular upper body clothing?: A Little Help from another person to put on and taking off regular lower body clothing?: A Lot 6  Click Score: 16    End of Session Equipment Utilized During Treatment: Oxygen  OT Visit Diagnosis: Unsteadiness on feet (R26.81);Muscle weakness (generalized) (M62.81);Pain Pain - part of body:  (sternum, generalized )   Activity Tolerance Patient tolerated treatment well   Patient Left in bed;with call bell/phone within reach;with bed alarm set   Nurse Communication Mobility status        Time: 6720-9470 OT Time Calculation (min): 28 min  Charges: OT General Charges $OT Visit: 1 Visit OT Treatments $Self Care/Home Management : 23-37 mins  Lou Cal, OT Acute Rehabilitation Services Pager [phone] Office [phone]    Raymondo Band 01/30/2020, 4:15 PM

## 2020-01-30 NOTE — Progress Notes (Addendum)
      [address]       [place] [address]             [phone]      4 Days Post-Op Procedure(s) (LRB): TRANSESOPHAGEAL ECHOCARDIOGRAM (TEE) (N/A) CARDIOVERSION (N/A)   Subjective:  No new complaints.  Continues to feel lousy and has a lot of pain.  She is not ambulating.  Objective: Vital signs in last 24 hours: Temp:  [96.4 F (35.8 C)-98.5 F (36.9 C)] 97.7 F (36.5 C) (07/26 0624) Pulse Rate:  [95-120] 97 (07/25 2330) Cardiac Rhythm: Atrial fibrillation (07/26 0410) Resp:  [18-34] 20 (07/26 0624) BP: (95-114)/(52-71) 108/71 (07/25 2330) SpO2:  [94 %-100 %] 95 % (07/25 2330) Weight:  [57.1 kg] 57.1 kg (07/26 0500)  Intake/Output from previous day: 07/25 0701 - 07/26 0700 In: 440 [P.O.:440] Out: 1351 [Urine:1350; Stool:1]  General appearance: alert and no distress Heart: irregularly irregular rhythm Lungs: clear to auscultation bilaterally Abdomen: soft, non-tender; bowel sounds normal; no masses,  no organomegaly Extremities: edema trace Wound: clean and dryt  Lab Results: Recent Labs    01/30/20 0209  WBC 10.9*  HGB 9.3*  HCT 30.6*  PLT 278   BMET:  Recent Labs    01/30/20 0209  NA 138  K 3.4*  CL 91*  CO2 36*  GLUCOSE 110*  BUN 16  CREATININE 0.79  CALCIUM 7.7*    PT/INR:  Recent Labs    01/30/20 0209  LABPROT 28.7*  INR 2.8*   ABG    Component Value Date/Time   PHART 7.235 (L) 01/18/2020 0106   HCO3 21.7 01/18/2020 0106   TCO2 27 01/19/2020 2206   ACIDBASEDEF 5.0 (H) 01/18/2020 0106   O2SAT 76.5 01/20/2020 0436   CBG (last 3)  No results for input(s): GLUCAP in the last 72 hours.  Assessment/Plan: S/P Procedure(s) (LRB): TRANSESOPHAGEAL ECHOCARDIOGRAM (TEE) (N/A) CARDIOVERSION (N/A)  1. CV- Atrial Fibrillation, rates in the 110s- continue Amiodarone, Lopressor, EP is following, planning for cardioversion tomorrow 2. INR 2.8, continue coumadin at 2 mg daily 3. Renal- creatinine has been WNL, remains  volume overloaded continue IV diuretics at 60 mg BID 4. Hypokalemia, mild at 3.4. will increase potassium supplementation 5. Expected post operative anemia, Hgb at 9.3, continue iron 6. Deconditioning- present at baseline, surgery has exacerbated pain, patient requires a lot of encouragement, needs aggressive PT, likely SNF at discharge 7. Dispo- patient stable, remains in rate controlled A. Fib, INR is at 2.8, for cardioversion tomorrow, continue IV diuretics, supplement potassium, continue PT/OT as able   LOS: 13 days    Ellwood Handler, PA-C 01/30/2020    I have seen and examined the patient and agree with the assessment and plan as outlined.  For repeat DCCV in am tomorrow.  Discussed with sister at bedside.  At this time it remains unclear whether or not patient's family remains willing to have her come home with home health PT and nursing care.  I do not expect her current complaints or level of anxiety to improve much in the near future.  Rexene Alberts, MD 01/30/2020 9:05 AM

## 2020-01-30 NOTE — Progress Notes (Addendum)
Progress Note  Patient Name: [name] Date of Encounter: 01/30/2020  [facility] Cardiologist: [name]  Subjective   "I feels so weak and tired, I am scared my heart is just going to stop"  Inpatient Medications    Scheduled Meds:  amiodarone  400 mg Oral Q8H   Chlorhexidine Gluconate Cloth  6 each Topical Daily   clonazePAM  0.5 mg Oral BID   fentaNYL  1 patch Transdermal Q72H   ferrous [id number] C-folic acid  1 capsule Oral Q breakfast   furosemide  40 mg Oral Daily   magnesium oxide  200 mg Oral Daily   mouth rinse  15 mL Mouth Rinse BID   metoprolol tartrate  50 mg Oral BID   pantoprazole  40 mg Oral Daily   [START ON 01/31/2020] potassium chloride  20 mEq Oral Daily   potassium chloride  40 mEq Oral Once   potassium chloride  60 mEq Oral Once   sodium chloride flush  10-40 mL Intracatheter Q12H   sodium chloride flush  3 mL Intravenous Q12H   warfarin  1 mg Oral q1600   Warfarin - Physician Dosing Inpatient   Does not apply q1600   Continuous Infusions:  sodium chloride 250 mL (01/25/20 1100)   lactated ringers     lactated ringers Stopped (01/20/20 0128)   magnesium sulfate bolus IVPB     PRN Meds: cyclobenzaprine, gabapentin, magic mouthwash, menthol-cetylpyridinium, metoprolol tartrate, Muscle Rub, ondansetron (ZOFRAN) IV, oxyCODONE, sodium chloride flush, sodium chloride flush   Vital Signs    Vitals:   01/30/20 0200 01/30/20 0500 01/30/20 0624 01/30/20 0809  BP:    111/81  Pulse:    (!) 114  Resp: 20  20 20   Temp:   97.7 F (36.5 C) 98.1 F (36.7 C)  TempSrc:   Oral Oral  SpO2:    99%  Weight:  57.1 kg    Height:        Intake/Output Summary (Last 24 hours) at 01/30/2020 0909 Last data filed at 01/30/2020 4193 Gross per 24 hour  Intake 320 ml  Output 1051 ml  Net -731 ml   Last 3 Weights 01/30/2020 01/29/2020 01/28/2020  Weight (lbs) 125 lb 12.8 oz 136 lb 0.4 oz 131 lb 9.8 oz  Weight (kg) 57.063 kg 61.7 kg 59.7 kg  Some  encounter information is confidential and restricted. Go to Review Flowsheets activity to see all data.      Telemetry    AFib 90's-120's - Personally Reviewed  ECG    No new EKGs - Personally Reviewed  Physical Exam   GEN: No acute physical distress, though anxious  Neck: No JVD Cardiac: irreg-irreg, tcahycardic, no murmurs, rubs, or gallops.  Respiratory: decreased at the bases GI: Soft, nontender, non-distended  MS: No edema; No deformity. Neuro:  Nonfocal  Psych: anxious (reportedly at or about her baseline) Thoracotomy is clean and dry  Labs    High Sensitivity Troponin:  No results for input(s): TROPONINIHS in the last 720 hours.    Chemistry Recent Labs  Lab 01/26/20 0348 01/27/20 0413 01/30/20 0209  NA 139 141 138  K 4.4 4.4 3.4*  CL 103 100 91*  CO2 25 30 36*  GLUCOSE 90 111* 110*  BUN 15 16 16   CREATININE 0.76 0.66 0.79  CALCIUM 7.4* 7.6* 7.7*  GFRNONAA >60 >60 >60  GFRAA >60 >60 >60  ANIONGAP 11 11 11      Hematology Recent Labs  Lab 01/25/20 [phone]  01/26/20 0348 01/30/20 0209  WBC 7.9 10.2 10.9*  RBC 3.62* 3.86* 3.47*  HGB 9.7* 10.6* 9.3*  HCT 32.0* 34.7* 30.6*  MCV 88.4 89.9 88.2  MCH 26.8 27.5 26.8  MCHC 30.3 30.5 30.4  RDW 22.1* 21.7* 21.0*  PLT 156 219 278    BNPNo results for input(s): BNP, PROBNP in the last 168 hours.   DDimer No results for input(s): DDIMER in the last 168 hours.   Radiology    No results found.  Cardiac Studies    01/26/2020; TTE IMPRESSIONS  1. Left ventricular ejection fraction, by estimation, is 55 to 60%. The  left ventricle has normal function. The left ventricle has no regional  wall motion abnormalities.   2. Right ventricular systolic function is normal. The right ventricular  size is normal.   3. S/p surgical LAA clipping. No residual connection noted with the LA.  No LA thrombus. No left atrial/left atrial appendage thrombus was  detected.   4. 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 No  residual MR. MVA by  direct 3D MPR assessment 2.6 cm2. The mitral valve has been  repaired/replaced. No evidence of mitral valve regurgitation. No evidence  of mitral stenosis. The mean mitral valve  gradient is 4.0 mmHg with average heart rate of 111 bpm. There is a 32 mm  prosthetic annuloplasty ring present in the mitral position. Procedure  Date: 01/17/2020.   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   6. The aortic valve is tricuspid. Aortic valve regurgitation is not  visualized. No aortic stenosis is present.    12/02/2019; LHC Prox RCA lesion is 20% stenosed. Dist LAD lesion is 20% stenosed.   1. Mild non-obstructive CAD   Recommendation: Continue planning for mitral valve repair.    Patient Profile     74 y.o. female w/PMHx of severe scoliosis with numerous prior surgeries (on chronic pain meds), PAFib and MR, anxiety  Admitted to [facility] 01/17/2020 for minimally invasive MVR/MAZE and LAA clipping, complicated by left ventricular free wall laceration requiring an additional median sternotomy post op developed PAFib w/RVR despite her Tikosyn (on out patient) metoprolol added   TEE/DCCV 01/26/2020 to SR (INR 2.2) 01/27/2020 > AFib w/RVR tikosyn stopped > amiodarone > SR 01/28/2020 > Afib transitioned to PO and increased to 400mg  TID  Assessment & Plan    1. Paroxysmal AFib      CHA2DS2Vasc is 3, on warfarin   Post op struggling with recurrent PAfib despite meds, DCCV Patient very anxious (though as well at baseline), feels pretty awful not sure how much AFib plays into this, she is aware of her heart fast.  She has had therapeutic INR's since her TEE/DCCV Planned for DCCV tomorrow AM   [name] has seen and examined the patient this AM and discussed plan with her, she is agreeable  Continue with CTS service Is addressing her diuresis and lytes    For questions or updates, please contact [facility] Please consult [email or web address] for contact info under          Signed, [name], PA-C  01/30/2020, 9:09 AM    I have seen and examined this patient with [name].  Agree with above, note added to reflect my findings.  On exam, tachycardic, irregular, 1+ lower extremity edema.  Patient remains tachycardic.  We Warwick Nick continue amiodarone today.  She does continue to feel weak and fatigued.  I do feel that she would likely need skilled nursing on her discharge.  We Cheyeanne Roadcap plan for cardioversion tomorrow.  Would keep her n.p.o. after midnight.  If she goes back into atrial fibrillation, Sharline Lehane work on a rate control strategy.  She may need digoxin.  Latiya Navia M. Candido Flott MD 01/30/2020 10:26 AM

## 2020-01-30 NOTE — Progress Notes (Signed)
CARDIAC REHAB PHASE I   PRE:  Rate/Rhythm: 119-122 afib  BP:  Supine:   Sitting: 111/66  Standing:    SaO2: 97% 3L  MODE:  Ambulation: 140 ft   POST:  Rate/Rhythm: 133 afib  BP:  Supine:   Sitting: 120/63  Standing:    SaO2: 94% 3L 0841-0905 Pt walked 140 ft on 3L with rollator, gait belt use, and asst x 2. Pt given encouragement and emotional support. Did not need to sit to rest until back in room to recliner. HR to 133. Sister in room. Pt requested pain med. Notified RN. Encouraged IS.   Graylon Good, RN BSN  01/30/2020 9:02 AM

## 2020-01-31 ENCOUNTER — Encounter (HOSPITAL_COMMUNITY)
Admission: RE | Disposition: A | Discharge status: Home Health | Payer: Self-pay | Source: Home / Self Care | Attending: Thoracic Surgery (Cardiothoracic Vascular Surgery)

## 2020-01-31 LAB — MAGNESIUM: Magnesium: 2.6 mg/dL — ABNORMAL HIGH (ref 1.7–2.4)

## 2020-01-31 LAB — PROTIME-INR
INR: 4 — ABNORMAL HIGH (ref 0.8–1.2)
Prothrombin Time: 37.6 seconds — ABNORMAL HIGH (ref 11.4–15.2)

## 2020-01-31 SURGERY — CARDIOVERSION
Anesthesia: General

## 2020-01-31 NOTE — Progress Notes (Signed)
Pt has converted to normal sinus rhythm. Heart rate at 75.  Will continue to monitor.  Lupita Dawn, RN

## 2020-01-31 NOTE — Progress Notes (Signed)
Physical Therapy Treatment Patient Details Name: [name] MRN: [id number] DOB: 1945-08-14 Today's Date: 01/31/2020    History of Present Illness Pt is a 74 y.o. female admitted 01/17/20 for minimally invasive MVR and maze procedure; required median sternotomy for repair of L ventricular free-wall laceration. Episode of afib with RVR and hypotension 7/21. S/p TEE, cardioversion 7/22. PMH includes severe scoliosis (s/p procedures), chronic pain, osteoporosis, arthritis, peripheral neuropathy, anxiety.    PT Comments    Pt pleasant and reports being happy to be in NSR. Pt with very fluctuant SpO2 on RA sitting EOb ranged from 84-96% with good pleth. Applied 1L throughout mobility with Sats 90-99% and cues for breathing technique. Pt continues to require cues and assist for all mobility to maintain precautions and she reports family will be assisting at D/C. Pt educated for continued need to maximize distance and adherence to precautions.   BP 99/54 (67) pre gait  122/46 (67) post gait   Follow Up Recommendations  Home health PT;Supervision/Assistance - 24 hour     Equipment Recommendations  Other (comment) (rollator)    Recommendations for Other Services       Precautions / Restrictions Precautions Precautions: Sternal;Fall Precaution Comments: verbally reviewed/educated during session, watch HR and O2    Mobility  Bed Mobility Overal bed mobility: Needs Assistance Bed Mobility: Rolling;Sidelying to Sit Rolling: Min guard Sidelying to sit: Min assist       General bed mobility comments: cues for sequence to maintain precautions with min assist to elevate trunk from surface  Transfers Overall transfer level: Needs assistance   Transfers: Sit to/from Stand Sit to Stand: Min assist         General transfer comment: min A for power up and steadying, VCs for safe hand placement with transitions   Ambulation/Gait Ambulation/Gait assistance: Min assist Gait Distance  (Feet): 90 Feet Assistive device: Rolling walker (2 wheeled) Gait Pattern/deviations: Step-through pattern;Decreased stride length;Trunk flexed   Gait velocity interpretation: <1.8 ft/sec, indicate of risk for recurrent falls General Gait Details: guarding for safety with assist to direct RW at times. Pt on 1L throughout gait with sats 90-99%. HR 75-90   Stairs Stairs: Yes Stairs assistance: Min guard Stair Management: Step to pattern;Forwards;One rail Right Number of Stairs: 3 General stair comments: cues for hand placement, precautions and safety. Pt ascending forward and descending sideways with bil hands on rail   Wheelchair Mobility    Modified Rankin (Stroke Patients Only)       Balance Overall balance assessment: Needs assistance   Sitting balance-Leahy Scale: Fair Sitting balance - Comments: able to sit EOB with supervision   Standing balance support: Bilateral upper extremity supported Standing balance-Leahy Scale: Poor Standing balance comment: Reliant on UE support                            Cognition Arousal/Alertness: Awake/alert Behavior During Therapy: WFL for tasks assessed/performed;Anxious Overall Cognitive Status: Impaired/Different from baseline Area of Impairment: Attention;Memory;Following commands                 Orientation Level: Disoriented to;Time Current Attention Level: Sustained Memory: Decreased short-term memory;Decreased recall of precautions Following Commands: Follows one step commands consistently;Follows one step commands with increased time Safety/Judgement: Decreased awareness of safety;Decreased awareness of deficits   Problem Solving: Requires verbal cues General Comments: continues to have decreased recall of sternal precautions with mod cues to maintain      Exercises General Exercises -  Lower Extremity Long Arc Quad: AROM;20 reps Hip ABduction/ADduction: AAROM;Both;Seated;10 reps Hip Flexion/Marching:  AROM;Both;15 reps;Seated    General Comments        Pertinent Vitals/Pain Pain Score: 4  Pain Location: back pain Pain Descriptors / Indicators: Discomfort;Aching Pain Intervention(s): Limited activity within patient's tolerance;Repositioned    Home Living                      Prior Function            PT Goals (current goals can now be found in the care plan section) Progress towards PT goals: Progressing toward goals    Frequency    Min 3X/week      PT Plan Current plan remains appropriate    Co-evaluation              AM-PAC PT "6 Clicks" Mobility   Outcome Measure  Help needed turning from your back to your side while in a flat bed without using bedrails?: A Little Help needed moving from lying on your back to sitting on the side of a flat bed without using bedrails?: A Little Help needed moving to and from a bed to a chair (including a wheelchair)?: A Little Help needed standing up from a chair using your arms (e.g., wheelchair or bedside chair)?: A Little Help needed to walk in hospital room?: A Little Help needed climbing 3-5 steps with a railing? : A Little 6 Click Score: 18    End of Session Equipment Utilized During Treatment: Gait belt;Oxygen Activity Tolerance: Patient tolerated treatment well Patient left: in chair;with call bell/phone within reach;with chair alarm set Nurse Communication: Mobility status PT Visit Diagnosis: Other abnormalities of gait and mobility (R26.89);Muscle weakness (generalized) (M62.81)     Time: 6222-9798 PT Time Calculation (min) (ACUTE ONLY): 28 min  Charges:  $Gait Training: 8-22 mins $Therapeutic Activity: 8-22 mins                     Johan Antonacci P, PT Acute Rehabilitation Services Pager: [phone] Office: [place] 01/31/2020, 1:38 PM

## 2020-01-31 NOTE — Progress Notes (Signed)
Inpatient Rehab Admissions:  Inpatient Rehab Consult received.  I met with patient at the bedside for rehabilitation assessment and to discuss goals and expectations of an inpatient rehab admission.  She is somewhat lethargic and potentially with some mild confusion.  Will speak to her sister, [name], to discuss available help after discharge from CIR.   Signed: Shann Medal, PT, DPT Admissions Coordinator [phone] 01/31/20  3:10 PM

## 2020-01-31 NOTE — Progress Notes (Addendum)
      [address]       [place] [address]             [phone]      5 Days Post-Op Procedure(s) (LRB): TRANSESOPHAGEAL ECHOCARDIOGRAM (TEE) (N/A) CARDIOVERSION (N/A) Subjective: Feels okay this morning. She is thirsty and asking for water. She is having pain and requesting pain medication.   Objective: Vital signs in last 24 hours: Temp:  [97.6 F (36.4 C)-98.6 F (37 C)] 98.1 F (36.7 C) (07/27 0750) Pulse Rate:  [74-117] 85 (07/27 0750) Cardiac Rhythm: Atrial fibrillation (07/26 1900) Resp:  [17-28] 20 (07/27 0750) BP: (100-123)/(44-66) 116/55 (07/27 0750) SpO2:  [94 %-100 %] 95 % (07/27 0750) Weight:  [55.7 kg] 55.7 kg (07/27 0108)     Intake/Output from previous day: 07/26 0701 - 07/27 0700 In: 440 [P.O.:440] Out: 825 [Urine:825] Intake/Output this shift: No intake/output data recorded.  General appearance: alert, cooperative and no distress Heart: regular rate and rhythm, S1, S2 normal, no murmur, click, rub or gallop Lungs: clear to auscultation bilaterally Abdomen: soft, non-tender; bowel sounds normal; no masses,  no organomegaly Extremities: extremities normal, atraumatic, no cyanosis or edema Wound: clean and dry  Lab Results: Recent Labs    01/30/20 0209  WBC 10.9*  HGB 9.3*  HCT 30.6*  PLT 278   BMET:  Recent Labs    01/30/20 0209  NA 138  K 3.4*  CL 91*  CO2 36*  GLUCOSE 110*  BUN 16  CREATININE 0.79  CALCIUM 7.7*    PT/INR:  Recent Labs    01/31/20 0544  LABPROT 37.6*  INR 4.0*   ABG    Component Value Date/Time   PHART 7.235 (L) 01/18/2020 0106   HCO3 21.7 01/18/2020 0106   TCO2 27 01/19/2020 2206   ACIDBASEDEF 5.0 (H) 01/18/2020 0106   O2SAT 76.5 01/20/2020 0436   CBG (last 3)  No results for input(s): GLUCAP in the last 72 hours.  Assessment/Plan: S/P Procedure(s) (LRB): TRANSESOPHAGEAL ECHOCARDIOGRAM (TEE) (N/A) CARDIOVERSION (N/A)  1. CV- NSR in the 90s this morning- continue Amiodarone  TID, Lopressor, EP is following 2. INR 4.0, she got only 1mg  of coumadin yesterday. Will hold dose today.  3. Renal- creatinine 0.79, remains volume overloaded continue lasix 40mg  daily PO 4. Hypokalemia, mild at 3.4. getting potassium supplementation. 5. Expected post operative anemia, Hgb at 9.3/30.6, continue iron  Plan: Pain control continues to be an issue but improving. Family requesting CIR, but if she doesn't qualify she will likely need SNF. She needs assistance getting from bed to chair. Continue PT/OT.  Close follow-up with EP for afib.   LOS: 14 days    Elgie Collard 01/31/2020   I have seen and examined the patient and agree with the assessment and plan as outlined.  Spontaneously converted into NSR overnight - repeat DCCV cancelled.  INR up further - hold Coumadin tonight.  Rexene Alberts, MD 01/31/2020 1:57 PM

## 2020-01-31 NOTE — Progress Notes (Signed)
Cardioversion removed Endoscopy schedule 01/31/20 for patient converted to sinus rhythm per [name] progress note this AM.

## 2020-01-31 NOTE — Progress Notes (Signed)
EKG done this AM.  Shows normal sinus rhythm.

## 2020-01-31 NOTE — Care Management Important Message (Signed)
Important Message  Patient Details  Name: AHLAYAH TARKOWSKI MRN: [id number] Date of Birth: 1945-09-14   Medicare Important Message Given:  Yes     Shelda Altes 01/31/2020, 10:37 AM

## 2020-01-31 NOTE — Progress Notes (Signed)
CARDIAC REHAB PHASE I   PRE:  Rate/Rhythm: 82 SR  BP:  Supine:   Sitting: 110/60  Standing:    SaO2: 87% RA ear and 84% on finger  MODE:  Ambulation: 108 ft   POST:  Rate/Rhythm: 80 SR  BP:  Supine:   Sitting: 108/57  Standing:    SaO2: 100% 2L  0851-0930 Pt on RA when I entered and sats below 90%. Tried ear and fingers and both below 90%. Put on 2L for walk and left on 2L as sats much better. Pt happy she did not have to have DCCV but she is sleepy from meds. Pt walked 108 ft on 2L with gait belt use, rollator and asst x 1. Encouraged pt to stay in middle of hallway and to stay close to rollator and to take bigger steps. Sat once halfway to rest. To recliner after walk with sister in room. Left on 2L since pt sleepy and sats decreased on RA. Encouraged walks with MT and PT.   Graylon Good, RN BSN  01/31/2020 9:25 AM

## 2020-01-31 NOTE — Progress Notes (Addendum)
Pt's 02 sat occasionally dropped down to below 90% at rest. Put pt on 2L 02 nasal canula. Pt's 02 sat >90%. Will continue to monitor the pt.  Lavenia Atlas, RN     Updated: [id number] was able to wean pt off the oxygen. Pt's 02 sat >90%  Lavenia Atlas, RN

## 2020-01-31 NOTE — Progress Notes (Addendum)
Progress Note  Patient Name: [name] Date of Encounter: 01/31/2020  [facility] Cardiologist: Dr. Curt [name]  Subjective   Feeling a little better  Inpatient Medications    Scheduled Meds:  amiodarone  400 mg Oral Q8H   Chlorhexidine Gluconate Cloth  6 each Topical Daily   clonazePAM  0.5 mg Oral BID   fentaNYL  1 patch Transdermal Q72H   ferrous [id number] C-folic acid  1 capsule Oral Q breakfast   furosemide  40 mg Oral Daily   magnesium oxide  200 mg Oral Daily   mouth rinse  15 mL Mouth Rinse BID   metoprolol tartrate  50 mg Oral BID   pantoprazole  40 mg Oral Daily   potassium chloride  20 mEq Oral Daily   sodium chloride flush  10-40 mL Intracatheter Q12H   sodium chloride flush  3 mL Intravenous Q12H   warfarin  1 mg Oral q1600   Warfarin - Physician Dosing Inpatient   Does not apply q1600   Continuous Infusions:  sodium chloride 250 mL (01/25/20 1100)   lactated ringers     lactated ringers Stopped (01/20/20 0128)   PRN Meds: cyclobenzaprine, gabapentin, magic mouthwash, menthol-cetylpyridinium, metoprolol tartrate, Muscle Rub, ondansetron (ZOFRAN) IV, oxyCODONE, sodium chloride flush, sodium chloride flush   Vital Signs    Vitals:   01/30/20 2248 01/30/20 2314 01/31/20 0108 01/31/20 0334  BP: (!) 120/61 114/66  (!) 123/62  Pulse: 104 104 74 88  Resp: 23 22 22 20   Temp:  98.4 F (36.9 C)  97.6 F (36.4 C)  TempSrc:  Oral  Oral  SpO2: 94% 100% 100% 97%  Weight:   55.7 kg   Height:        Intake/Output Summary (Last 24 hours) at 01/31/2020 0739 Last data filed at 01/31/2020 0600 Gross per 24 hour  Intake 440 ml  Output 825 ml  Net -385 ml   Last 3 Weights 01/31/2020 01/30/2020 01/29/2020  Weight (lbs) 122 lb 12.7 oz 125 lb 12.8 oz 136 lb 0.4 oz  Weight (kg) 55.7 kg 57.063 kg 61.7 kg  Some encounter information is confidential and restricted. Go to Review Flowsheets activity to see all data.      Telemetry    SR 80's - Personally  Reviewed  ECG   SR 79bpm - Personally Reviewed  Physical Exam   The patient was examined by Dr. Curt [name] GEN: No acute physical distress, though anxious  Neck: No JVD Cardiac: RRR, no murmurs, rubs, or gallops.  Respiratory: decreased at the bases GI: Soft, nontender, non-distended  MS: trace-1+ edema; No deformity. Neuro:  Nonfocal  Psych: less anxious (reportedly at or about her baseline) Thoracotomy is clean and dry  Labs    High Sensitivity Troponin:  No results for input(s): TROPONINIHS in the last 720 hours.    Chemistry Recent Labs  Lab 01/26/20 0348 01/27/20 0413 01/30/20 0209  NA 139 141 138  K 4.4 4.4 3.4*  CL 103 100 91*  CO2 25 30 36*  GLUCOSE 90 111* 110*  BUN 15 16 16   CREATININE 0.76 0.66 0.79  CALCIUM 7.4* 7.6* 7.7*  GFRNONAA >60 >60 >60  GFRAA >60 >60 >60  ANIONGAP 11 11 11      Hematology Recent Labs  Lab 01/25/20 0512 01/26/20 0348 01/30/20 0209  WBC 7.9 10.2 10.9*  RBC 3.62* 3.86* 3.47*  HGB 9.7* 10.6* 9.3*  HCT 32.0* 34.7* 30.6*  MCV 88.4 89.9 88.2  MCH 26.8 27.5 26.8  MCHC 30.3 30.5 30.4  RDW 22.1* 21.7* 21.0*  PLT 156 219 278    BNPNo results for input(s): BNP, PROBNP in the last 168 hours.   DDimer No results for input(s): DDIMER in the last 168 hours.   Radiology    No results found.  Cardiac Studies    01/26/2020; TTE IMPRESSIONS  1. Left ventricular ejection fraction, by estimation, is 55 to 60%. The  left ventricle has normal function. The left ventricle has no regional  wall motion abnormalities.   2. Right ventricular systolic function is normal. The right ventricular  size is normal.   3. S/p surgical LAA clipping. No residual connection noted with the LA.  No LA thrombus. No left atrial/left atrial appendage thrombus was  detected.   4. 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 No residual MR. MVA by  direct 3D MPR assessment 2.6 cm2. The mitral valve has been  repaired/replaced. No evidence of mitral  valve regurgitation. No evidence  of mitral stenosis. The mean mitral valve  gradient is 4.0 mmHg with average heart rate of 111 bpm. There is a 32 mm  prosthetic annuloplasty ring present in the mitral position. Procedure  Date: 01/17/2020.   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   6. The aortic valve is tricuspid. Aortic valve regurgitation is not  visualized. No aortic stenosis is present.    12/02/2019; LHC Prox RCA lesion is 20% stenosed. Dist LAD lesion is 20% stenosed.   1. Mild non-obstructive CAD   Recommendation: Continue planning for mitral valve repair.    Patient Profile     74 y.o. female w/PMHx of severe scoliosis with numerous prior surgeries (on chronic pain meds), PAFib and MR, anxiety  Admitted to [facility] 01/17/2020 for minimally invasive MVR/MAZE and LAA clipping, complicated by left ventricular free wall laceration requiring an additional median sternotomy post op developed PAFib w/RVR despite her Tikosyn (on out patient) metoprolol added   TEE/DCCV 01/26/2020 to SR (INR 2.2) 01/27/2020 > AFib w/RVR tikosyn stopped > amiodarone > SR 01/28/2020 > Afib transitioned to PO and increased to 400mg  TID  Assessment & Plan    1. Paroxysmal AFib      CHA2DS2Vasc is 3, on warfarin   Post op struggling with recurrent PAfib despite meds, DCCV Patient very anxious (though as well at baseline), feels pretty awful not sure how much AFib plays into this, she is aware of her heart fast.  She converted to SR  Continue amiodarone 400mg  TID and her metoprolol 50mg  BID AFib clinic follow up for 2 weeks [name] be arrange to follow rhythm and start down-titration of her amio.  EP service [name] sign off though remain available, please recall if needed    For questions or updates, please contact [place] Please consult [email or web address] for contact info under        Signed, Regina [place], PA-C  01/31/2020, 7:39 AM    I have seen and examined this patient with [name].   Agree with above, note added to reflect my findings.  On exam, RRR, no murmurs, 1-2+ edema. Converted to sinus last night. Would continue amiodarone 400 mg TID for 2 weeks total. [name] make follow up in AF clinic in 2 weeks to discuss further. Austen Wygant need continued diuresis and PT/OT.    Tyrae Alcoser M. Camrin Lapre MD 01/31/2020 7:46 AM

## 2020-02-01 ENCOUNTER — Inpatient Hospital Stay (HOSPITAL_COMMUNITY): Payer: Medicare Other

## 2020-02-01 LAB — CBC
HCT: 28.2 % — ABNORMAL LOW (ref 36.0–46.0)
Hemoglobin: 8.4 g/dL — ABNORMAL LOW (ref 12.0–15.0)
MCH: 26.5 pg (ref 26.0–34.0)
MCHC: 29.8 g/dL — ABNORMAL LOW (ref 30.0–36.0)
MCV: 89 fL (ref 80.0–100.0)
Platelets: 264 10*3/uL (ref 150–400)
RBC: 3.17 MIL/uL — ABNORMAL LOW (ref 3.87–5.11)
RDW: 20.8 % — ABNORMAL HIGH (ref 11.5–15.5)
WBC: 9 10*3/uL (ref 4.0–10.5)
nRBC: 0 % (ref 0.0–0.2)

## 2020-02-01 LAB — BASIC METABOLIC PANEL
Anion gap: 8 (ref 5–15)
BUN: 13 mg/dL (ref 8–23)
CO2: 35 mmol/L — ABNORMAL HIGH (ref 22–32)
Calcium: 7.7 mg/dL — ABNORMAL LOW (ref 8.9–10.3)
Chloride: 95 mmol/L — ABNORMAL LOW (ref 98–111)
Creatinine, Ser: 0.7 mg/dL (ref 0.44–1.00)
GFR calc Af Amer: >60 mL/min (ref >60)
GFR calc non Af Amer: >60 mL/min (ref >60)
Glucose, Bld: 103 mg/dL — ABNORMAL HIGH (ref 70–99)
Potassium: 4.8 mmol/L (ref 3.5–5.1)
Sodium: 138 mmol/L (ref 135–145)

## 2020-02-01 LAB — PROTIME-INR
INR: 5.6 (ref 0.8–1.2)
Prothrombin Time: 49 seconds — ABNORMAL HIGH (ref 11.4–15.2)

## 2020-02-01 LAB — MAGNESIUM: Magnesium: 2.4 mg/dL (ref 1.7–2.4)

## 2020-02-01 MED ORDER — BOOST PLUS PO LIQD
237.0000 mL | Freq: Three times a day (TID) | ORAL | Status: DC
  Administered 2020-02-02: 237 mL via ORAL
  Filled 2020-02-01 (×4): qty 237

## 2020-02-01 MED ORDER — PHYTONADIONE 5 MG PO TABS
5.0000 mg | ORAL_TABLET | Freq: Once | ORAL | Status: AC
  Administered 2020-02-01: 5 mg via ORAL
  Filled 2020-02-01: qty 1

## 2020-02-01 NOTE — Progress Notes (Signed)
Telemetry reviewed Maintaining SR 60's-80's Follow up is in place with gen cardiology and Afib clinic (if needed after her cardiology visit)  Please recall if needed.  [name], PA-C
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
## 2020-02-01 NOTE — Progress Notes (Addendum)
[address]       [place] [address]             [phone]      6 Days Post-Op Procedure(s) (LRB): TRANSESOPHAGEAL ECHOCARDIOGRAM (TEE) (N/A) CARDIOVERSION (N/A)   Subjective:  Patient without new complaints.  Continues to complain of pain.  Patient states family is providing care at discharge.  She doesn't wish to go to a facility.  Objective: Vital signs in last 24 hours: Temp:  [97.3 F (36.3 C)-98.3 F (36.8 C)] 97.3 F (36.3 C) (07/28 0300) Pulse Rate:  [47-97] 47 (07/28 0319) Cardiac Rhythm: Normal sinus rhythm (07/27 1947) Resp:  [19-21] 21 (07/28 0319) BP: (91-130)/(46-66) 118/66 (07/28 0300) SpO2:  [91 %-100 %] 98 % (07/28 0319) Weight:  [55.5 kg] 55.5 kg (07/28 0319)  Intake/Output from previous day: 07/27 0701 - 07/28 0700 In: 480 [P.O.:480] Out: -   General appearance: alert, cooperative and no distress Heart: regular rate and rhythm Lungs: clear to auscultation bilaterally Abdomen: soft, non-tender; bowel sounds normal; no masses,  no organomegaly Extremities: extremities normal, atraumatic, no cyanosis or edema Wound: clean and dry  Lab Results: Recent Labs    01/30/20 0209 02/01/20 0503  WBC 10.9* 9.0  HGB 9.3* 8.4*  HCT 30.6* 28.2*  PLT 278 264   BMET:  Recent Labs    01/30/20 0209 02/01/20 0503  NA 138 138  K 3.4* 4.8  CL 91* 95*  CO2 36* 35*  GLUCOSE 110* 103*  BUN 16 13  CREATININE 0.79 0.70  CALCIUM 7.7* 7.7*    PT/INR:  Recent Labs    02/01/20 0503  LABPROT 49.0*  INR 5.6*   ABG    Component Value Date/Time   PHART 7.235 (L) 01/18/2020 0106   HCO3 21.7 01/18/2020 0106   TCO2 27 01/19/2020 2206   ACIDBASEDEF 5.0 (H) 01/18/2020 0106   O2SAT 76.5 01/20/2020 0436   CBG (last 3)  No results for input(s): GLUCAP in the last 72 hours.  Assessment/Plan: S/P Procedure(s) (LRB): TRANSESOPHAGEAL ECHOCARDIOGRAM (TEE) (N/A) CARDIOVERSION (N/A)  1. CV- H/O A. Fib, currently in NSR- didn't require  cardioversion yesterday, EP is following.. on Amiodarone 2. INR 5.6, supratherapeutic, nursing gave Vitamin K as ordered by Dr. Roxy Manns, no coumadin this evening 3. Pulm- no acute issues, require oxygen at times with ambulation, but I suspect this is related to deconditioning and pain in chest attributing to not fully taking deep breaths, sats are good this morning, off oxyen 4. Hypokalemia- resolved 5. Deconditioning- needs home health, PT-- orders have been placed 6. dispo- patient stable, will arrange home needs, patients anxiety and pain remain unchanged, INR reversal with Vitamin K as ordered, patient asking when she can go home.   LOS: 15 days    Ellwood Handler, PA-C  02/01/2020   I have seen and examined the patient and agree with the assessment and plan as outlined.  Continues to maintain sinus rhythm.  Progressive increase in INR likely due to recent addition of amiodarone w/ subsequent interaction with Coumadin.  Will give 5 mg vitamin K today and recheck INR tomorrow.  Patient would likely benefit from short stay on CIR service for more aggressive physical rehab.  Discussed w/ patient's sisters who plan to take her home with home-health PT and nursing care when patient is able to physically get around well enough for them to manage.  They are very supportive and do not wish for her to go to  a SNF under any circumstances.  Rexene Alberts, MD 02/01/2020 7:45 AM

## 2020-02-01 NOTE — Progress Notes (Addendum)
Occupational Therapy Treatment Patient Details Name: [name] MRN: [id number] DOB: 10/16/1945 Today's Date: 02/01/2020    History of present illness Pt is a 74 y.o. female admitted 01/17/20 for minimally invasive MVR and maze procedure; required median sternotomy for repair of L ventricular free-wall laceration. Episode of afib with RVR and hypotension 7/21. S/p TEE, cardioversion 7/22. PMH includes severe scoliosis (s/p procedures), chronic pain, osteoporosis, arthritis, peripheral neuropathy, anxiety.   OT comments  Pt making gradual progress towards OT goals. She continues to require at least minA for sit<>stand transfers, able to perform room level mobility with close minguard assist. Pt engaging in seated and standing ADL tasks at sink, transitioning to sitting due to fatigue. Given fatigue with activity pt requiring increased time for all ADL/mobility completion. She reports she has AE which she typically uses for LB ADL at home. Trialled pt on RA with SpO2 decreasing to 88% with initial mobility into bathroom, use of 1L O2 for remainder of session and SpO2 maintaining >94%. Additional vitals stable. Feel pt is appropriate to return home with [facility] services pending her family is able to provide hands on assist/supervision given pt's cognitive deficits and decreased recall of sternal precautions. If family feels they are unable to provide necessary supervision she may benefit from additional [place] rehab services prior to return home. Will continue to follow acutely.   Follow Up Recommendations  Home health OT;Supervision/Assistance - 24 hour (pending progress, may benefit from ST rehab)    Equipment Recommendations  3 in 1 bedside commode          Precautions / Restrictions Precautions Precautions: Sternal;Fall Precaution Comments: verbally reviewed/educated during session, watch HR and O2 Restrictions Weight Bearing Restrictions: No       Mobility Bed Mobility Overal bed mobility:  Needs Assistance Bed Mobility: Rolling;Sidelying to Sit;Sit to Sidelying Rolling: Supervision Sidelying to sit: Min guard     Sit to sidelying: Min guard General bed mobility comments: use of bed rail and cues to initiate and properly sequence through task to ensure carryover of precautions; HOB slightly elevated   Transfers Overall transfer level: Needs assistance Equipment used: Rolling walker (2 wheeled) Transfers: Sit to/from Stand Sit to Stand: Min assist;Min guard         General transfer comment: pt requiring boosting assist from EOB and from toilet, VCs for safe hand placement, able to stand with minguard assist from [facility] in front of sink    Balance Overall balance assessment: Needs assistance Sitting-balance support: Feet supported Sitting balance-Leahy Scale: Fair Sitting balance - Comments: able to sit EOB with supervision   Standing balance support: Bilateral upper extremity supported;Single extremity supported;During functional activity Standing balance-Leahy Scale: Poor Standing balance comment: Reliant on UE support                           ADL either performed or assessed with clinical judgement   ADL Overall ADL's : Needs assistance/impaired     Grooming: Wash/dry face;Oral care;Brushing hair;Min guard;Standing;Set up;Sitting Grooming Details (indicate cue type and reason): attempted to have pt perform in standing, pt initating oral care in standing but due to fatigue needing to sit for completion of additional activities                Lower Body Dressing Details (indicate cue type and reason): pt reports she typically uses sock aide and reacher for LB dressing ADL  Toilet Transfer: Min guard;Minimal assistance;Ambulation;Regular Glass blower/designer Details (  indicate cue type and reason): minA for sit<>stand         Functional mobility during ADLs: Min guard;Minimal assistance;Rolling walker       Vision       Perception      Praxis      Cognition Arousal/Alertness: Awake/alert Behavior During Therapy: WFL for tasks assessed/performed;Anxious Overall Cognitive Status: Impaired/Different from baseline Area of Impairment: Attention;Memory;Following commands                 Orientation Level: Disoriented to;Time Current Attention Level: Sustained Memory: Decreased short-term memory;Decreased recall of precautions Following Commands: Follows one step commands consistently;Follows one step commands with increased time Safety/Judgement: Decreased awareness of safety;Decreased awareness of deficits   Problem Solving: Requires verbal cues General Comments: pt with some ability to recall sternal precautions ("stay in the tube"), forgot that her sister had already been in to visit today        Exercises     Shoulder Instructions       General Comments      Pertinent Vitals/ Pain       Pain Assessment: Faces Faces Pain Scale: Hurts a little bit Pain Location: back pain Pain Descriptors / Indicators: Discomfort;Aching Pain Intervention(s): Monitored during session;Repositioned  Home Living                                          Prior Functioning/Environment              Frequency  Min 2X/week        Progress Toward Goals  OT Goals(current goals can now be found in the care plan section)  Progress towards OT goals: Progressing toward goals  Acute Rehab OT Goals Patient Stated Goal: to get back to her baseline  OT Goal Formulation: With patient Time For Goal Achievement: 02/05/20 Potential to Achieve Goals: Good ADL Goals Pt Will Perform Lower Body Dressing: with supervision;sitting/lateral leans;sit to/from stand Pt Will Transfer to Toilet: with supervision;ambulating;bedside commode Pt/caregiver will Perform Home Exercise Program: Increased strength;Both right and left upper extremity Additional ADL Goal #1: Pt will state sternal precautions with no verbal  cues to assist in order to ensure safety for d/c home. Additional ADL Goal #2: Pt will increase to supervisionA for OOB ADL tasks with O2 sats >90%.  Plan Discharge plan remains appropriate    Co-evaluation                 AM-PAC OT "6 Clicks" Daily Activity     Outcome Measure   Help from another person eating meals?: None Help from another person taking care of personal grooming?: A Little Help from another person toileting, which includes using toliet, bedpan, or urinal?: A Lot Help from another person bathing (including washing, rinsing, drying)?: A Lot Help from another person to put on and taking off regular upper body clothing?: A Little Help from another person to put on and taking off regular lower body clothing?: A Lot 6 Click Score: 16    End of Session Equipment Utilized During Treatment: Gait belt;Rolling walker;Oxygen  OT Visit Diagnosis: Unsteadiness on feet (R26.81);Muscle weakness (generalized) (M62.81);Pain   Activity Tolerance Patient tolerated treatment well   Patient Left in bed;with call bell/phone within reach;with bed alarm set   Nurse Communication Mobility status        Time: 4132-4401 OT Time Calculation (min): 40 min  Charges:  OT General Charges $OT Visit: 1 Visit OT Treatments $Self Care/Home Management : 38-52 mins  Lou Cal, OT Acute Rehabilitation Services Pager [phone] Office [phone]    Raymondo Band 02/01/2020, 5:11 PM

## 2020-02-01 NOTE — Progress Notes (Addendum)
Inpatient Rehab Admissions Coordinator:   [id number]: FYTWK with OT who feels pt would be appropriate for either CIR to maximize independence, or HH (if she chooses) with support from family.  Note INR level today >5 and will not be medically ready for CIR.  Will continue to follow day-by-day to assess for candidacy.    Met with pt and her sisters at the bedside to discuss CIR.  We discussed pt current level of function may be too high for CIR (therapy recommendations have been consistently for home health).  Will see how she does with therapy today, and f/u with family later this afternoon.    Shann Medal, PT, DPT Admissions Coordinator [phone] 02/01/20  10:24 AM

## 2020-02-01 NOTE — Progress Notes (Signed)
CARDIAC REHAB PHASE I   PRE:  Rate/Rhythm: 102 ST       IN bathroom   MODE:  Ambulation: 68 ft   POST:  Rate/Rhythm: 112 ST  BP:  Supine:   Sitting: 129/59  Standing:    SaO2: 91%RA 0820-0850 Pt in bathroom when we arrived to walk with her.  Assisted pt to get cleaned and wash hands. Then we walked short distance of 68 ft on RA with rolling walker, gait belt use, and asst x 2. Tried to get RA sats but would not register in hallway. Checked sats once pt back in recliner and at 91%. Pt c/o weakness and being tired so walk cut short. Pt stated she was tired from going to [place] and using bathroom. Sister in room. Encouraged her to walk with MT later.   Graylon Good, RN BSN  02/01/2020 8:48 AM

## 2020-02-02 ENCOUNTER — Encounter (HOSPITAL_COMMUNITY): Payer: Self-pay | Admitting: Physical Medicine and Rehabilitation

## 2020-02-02 ENCOUNTER — Inpatient Hospital Stay (HOSPITAL_COMMUNITY)
Admission: RE | Admit: 2020-02-02 | Discharge: 2020-02-08 | DRG: 945 | Disposition: A | Discharge status: Home Health | Payer: Medicare Other | Source: Intra-hospital | Attending: Physical Medicine and Rehabilitation | Admitting: Physical Medicine and Rehabilitation

## 2020-02-02 DIAGNOSIS — Z833 Family history of diabetes mellitus: Secondary | ICD-10-CM | POA: Diagnosis not present

## 2020-02-02 DIAGNOSIS — F419 Anxiety disorder, unspecified: Secondary | ICD-10-CM | POA: Diagnosis present

## 2020-02-02 DIAGNOSIS — Z8249 Family history of ischemic heart disease and other diseases of the circulatory system: Secondary | ICD-10-CM

## 2020-02-02 DIAGNOSIS — D62 Acute posthemorrhagic anemia: Secondary | ICD-10-CM | POA: Diagnosis present

## 2020-02-02 DIAGNOSIS — Z885 Allergy status to narcotic agent status: Secondary | ICD-10-CM

## 2020-02-02 DIAGNOSIS — Z91041 Radiographic dye allergy status: Secondary | ICD-10-CM | POA: Diagnosis not present

## 2020-02-02 DIAGNOSIS — Z881 Allergy status to other antibiotic agents status: Secondary | ICD-10-CM | POA: Diagnosis not present

## 2020-02-02 DIAGNOSIS — R1310 Dysphagia, unspecified: Secondary | ICD-10-CM | POA: Diagnosis present

## 2020-02-02 DIAGNOSIS — M549 Dorsalgia, unspecified: Secondary | ICD-10-CM | POA: Diagnosis present

## 2020-02-02 DIAGNOSIS — Z886 Allergy status to analgesic agent status: Secondary | ICD-10-CM

## 2020-02-02 DIAGNOSIS — Z823 Family history of stroke: Secondary | ICD-10-CM | POA: Diagnosis not present

## 2020-02-02 DIAGNOSIS — Z87891 Personal history of nicotine dependence: Secondary | ICD-10-CM | POA: Diagnosis not present

## 2020-02-02 DIAGNOSIS — Z79899 Other long term (current) drug therapy: Secondary | ICD-10-CM

## 2020-02-02 DIAGNOSIS — Z8049 Family history of malignant neoplasm of other genital organs: Secondary | ICD-10-CM | POA: Diagnosis not present

## 2020-02-02 DIAGNOSIS — G8929 Other chronic pain: Secondary | ICD-10-CM | POA: Diagnosis present

## 2020-02-02 DIAGNOSIS — R5381 Other malaise: Principal | ICD-10-CM | POA: Diagnosis present

## 2020-02-02 DIAGNOSIS — Z85820 Personal history of malignant melanoma of skin: Secondary | ICD-10-CM

## 2020-02-02 DIAGNOSIS — Z8261 Family history of arthritis: Secondary | ICD-10-CM

## 2020-02-02 DIAGNOSIS — I480 Paroxysmal atrial fibrillation: Secondary | ICD-10-CM | POA: Diagnosis present

## 2020-02-02 DIAGNOSIS — M810 Age-related osteoporosis without current pathological fracture: Secondary | ICD-10-CM | POA: Diagnosis present

## 2020-02-02 DIAGNOSIS — I4892 Unspecified atrial flutter: Secondary | ICD-10-CM | POA: Diagnosis present

## 2020-02-02 DIAGNOSIS — Z7982 Long term (current) use of aspirin: Secondary | ICD-10-CM

## 2020-02-02 DIAGNOSIS — Z7901 Long term (current) use of anticoagulants: Secondary | ICD-10-CM

## 2020-02-02 DIAGNOSIS — K219 Gastro-esophageal reflux disease without esophagitis: Secondary | ICD-10-CM | POA: Diagnosis present

## 2020-02-02 DIAGNOSIS — Z91040 Latex allergy status: Secondary | ICD-10-CM

## 2020-02-02 DIAGNOSIS — E8770 Fluid overload, unspecified: Secondary | ICD-10-CM | POA: Diagnosis present

## 2020-02-02 DIAGNOSIS — Z981 Arthrodesis status: Secondary | ICD-10-CM | POA: Diagnosis not present

## 2020-02-02 DIAGNOSIS — G609 Hereditary and idiopathic neuropathy, unspecified: Secondary | ICD-10-CM

## 2020-02-02 DIAGNOSIS — K5900 Constipation, unspecified: Secondary | ICD-10-CM | POA: Diagnosis present

## 2020-02-02 DIAGNOSIS — Z83438 Family history of other disorder of lipoprotein metabolism and other lipidemia: Secondary | ICD-10-CM | POA: Diagnosis not present

## 2020-02-02 DIAGNOSIS — Z91048 Other nonmedicinal substance allergy status: Secondary | ICD-10-CM

## 2020-02-02 DIAGNOSIS — G629 Polyneuropathy, unspecified: Secondary | ICD-10-CM | POA: Diagnosis present

## 2020-02-02 DIAGNOSIS — Z882 Allergy status to sulfonamides status: Secondary | ICD-10-CM

## 2020-02-02 DIAGNOSIS — I4891 Unspecified atrial fibrillation: Secondary | ICD-10-CM | POA: Diagnosis present

## 2020-02-02 DIAGNOSIS — E538 Deficiency of other specified B group vitamins: Secondary | ICD-10-CM | POA: Diagnosis present

## 2020-02-02 DIAGNOSIS — F32A Depression, unspecified: Secondary | ICD-10-CM | POA: Diagnosis present

## 2020-02-02 DIAGNOSIS — D519 Vitamin B12 deficiency anemia, unspecified: Secondary | ICD-10-CM | POA: Diagnosis present

## 2020-02-02 DIAGNOSIS — F329 Major depressive disorder, single episode, unspecified: Secondary | ICD-10-CM | POA: Diagnosis present

## 2020-02-02 LAB — BASIC METABOLIC PANEL
Anion gap: 9 (ref 5–15)
BUN: 13 mg/dL (ref 8–23)
CO2: 35 mmol/L — ABNORMAL HIGH (ref 22–32)
Calcium: 8.4 mg/dL — ABNORMAL LOW (ref 8.9–10.3)
Chloride: 95 mmol/L — ABNORMAL LOW (ref 98–111)
Creatinine, Ser: 0.77 mg/dL (ref 0.44–1.00)
GFR calc Af Amer: >60 mL/min (ref >60)
GFR calc non Af Amer: >60 mL/min (ref >60)
Glucose, Bld: 124 mg/dL — ABNORMAL HIGH (ref 70–99)
Potassium: 5 mmol/L (ref 3.5–5.1)
Sodium: 139 mmol/L (ref 135–145)

## 2020-02-02 LAB — MAGNESIUM: Magnesium: 2.1 mg/dL (ref 1.7–2.4)

## 2020-02-02 LAB — PROTIME-INR
INR: 1.5 — ABNORMAL HIGH (ref 0.8–1.2)
Prothrombin Time: 17.8 seconds — ABNORMAL HIGH (ref 11.4–15.2)

## 2020-02-02 MED ORDER — WARFARIN SODIUM 1 MG PO TABS: 1.0000 mg | ORAL_TABLET | Freq: Every day | ORAL | Status: DC

## 2020-02-02 MED ORDER — CLONAZEPAM 0.5 MG PO TABS: 0.5000 mg | ORAL_TABLET | Freq: Two times a day (BID) | ORAL | Status: DC

## 2020-02-02 MED ORDER — CLONAZEPAM 0.5 MG PO TABS
0.5000 mg | ORAL_TABLET | Freq: Two times a day (BID) | ORAL | Status: DC | PRN
  Administered 2020-02-02 – 2020-02-03 (×2): 0.5 mg via ORAL
  Filled 2020-02-02 (×3): qty 1

## 2020-02-02 MED ORDER — MENTHOL 3 MG MT LOZG: 1.0000 | LOZENGE | OROMUCOSAL | Status: DC | PRN

## 2020-02-02 MED ORDER — ASPIRIN EC 81 MG PO TBEC
81.0000 mg | DELAYED_RELEASE_TABLET | Freq: Every day | ORAL | 2 refills | Status: DC
Start: 2020-02-02 — End: 2020-04-16

## 2020-02-02 MED ORDER — MUSCLE RUB 10-15 % EX CREA
TOPICAL_CREAM | CUTANEOUS | Status: DC | PRN
  Filled 2020-02-02: qty 85

## 2020-02-02 MED ORDER — ACETAMINOPHEN 325 MG PO TABS
325.0000 mg | ORAL_TABLET | ORAL | Status: DC | PRN
  Administered 2020-02-04 – 2020-02-08 (×7): 650 mg via ORAL
  Filled 2020-02-02 (×9): qty 2

## 2020-02-02 MED ORDER — AMIODARONE HCL 400 MG PO TABS: 400.0000 mg | ORAL_TABLET | Freq: Three times a day (TID) | ORAL | Status: DC

## 2020-02-02 MED ORDER — NYSTATIN POWD: 1.0000 "application " | Freq: Two times a day (BID) | Status: DC | PRN

## 2020-02-02 MED ORDER — GUAIFENESIN-DM 100-10 MG/5ML PO SYRP
5.0000 mL | ORAL_SOLUTION | Freq: Four times a day (QID) | ORAL | Status: DC | PRN
  Administered 2020-02-07: 10 mL via ORAL
  Filled 2020-02-02: qty 10

## 2020-02-02 MED ORDER — METOPROLOL TARTRATE 50 MG PO TABS: 50.0000 mg | ORAL_TABLET | Freq: Two times a day (BID) | ORAL | Status: DC

## 2020-02-02 MED ORDER — MAGNESIUM OXIDE 400 (241.3 MG) MG PO TABS
200.0000 mg | ORAL_TABLET | Freq: Every day | ORAL | Status: DC
  Administered 2020-02-03 – 2020-02-08 (×6): 200 mg via ORAL
  Filled 2020-02-02 (×6): qty 1

## 2020-02-02 MED ORDER — POLYVINYL ALCOHOL 1.4 % OP SOLN
1.0000 [drp] | OPHTHALMIC | Status: DC | PRN
  Filled 2020-02-02: qty 15

## 2020-02-02 MED ORDER — BISACODYL 10 MG RE SUPP: 10.0000 mg | Freq: Every day | RECTAL | Status: DC | PRN

## 2020-02-02 MED ORDER — ADULT MULTIVITAMIN W/MINERALS CH
1.0000 | ORAL_TABLET | Freq: Every day | ORAL | Status: DC
  Administered 2020-02-02 – 2020-02-08 (×8): 1 via ORAL
  Filled 2020-02-02 (×8): qty 1

## 2020-02-02 MED ORDER — WARFARIN - PHARMACIST DOSING INPATIENT: Freq: Every day | Status: DC

## 2020-02-02 MED ORDER — CHLORHEXIDINE GLUCONATE CLOTH 2 % EX PADS
6.0000 | MEDICATED_PAD | Freq: Every day | CUTANEOUS | Status: DC
  Administered 2020-02-03: 6 via TOPICAL

## 2020-02-02 MED ORDER — CARBOXYMETHYLCELLUL-GLYCERIN 0.5-0.9 % OP SOLN: 1.0000 [drp] | Freq: Every day | OPHTHALMIC | Status: DC | PRN

## 2020-02-02 MED ORDER — B COMPLEX-C PO TABS
1.0000 | ORAL_TABLET | Freq: Every day | ORAL | Status: DC
  Administered 2020-02-03 – 2020-02-08 (×6): 1 via ORAL
  Filled 2020-02-02 (×6): qty 1

## 2020-02-02 MED ORDER — MAGIC MOUTHWASH
5.0000 mL | Freq: Three times a day (TID) | ORAL | Status: DC | PRN
  Filled 2020-02-02: qty 5

## 2020-02-02 MED ORDER — FLEET ENEMA 7-19 GM/118ML RE ENEM: 1.0000 | ENEMA | Freq: Once | RECTAL | Status: DC | PRN

## 2020-02-02 MED ORDER — PROCHLORPERAZINE 25 MG RE SUPP: 12.5000 mg | Freq: Four times a day (QID) | RECTAL | Status: DC | PRN

## 2020-02-02 MED ORDER — GABAPENTIN 100 MG PO CAPS
100.0000 mg | ORAL_CAPSULE | Freq: Three times a day (TID) | ORAL | Status: DC
  Administered 2020-02-02 – 2020-02-08 (×18): 100 mg via ORAL
  Filled 2020-02-02 (×17): qty 1

## 2020-02-02 MED ORDER — CYCLOBENZAPRINE HCL 10 MG PO TABS: 10.0000 mg | ORAL_TABLET | Freq: Every evening | ORAL | Status: DC | PRN

## 2020-02-02 MED ORDER — METOPROLOL TARTRATE 50 MG PO TABS
50.0000 mg | ORAL_TABLET | Freq: Two times a day (BID) | ORAL | Status: DC
  Administered 2020-02-02 – 2020-02-08 (×12): 50 mg via ORAL
  Filled 2020-02-02 (×12): qty 1

## 2020-02-02 MED ORDER — FENTANYL 25 MCG/HR TD PT72
1.0000 | MEDICATED_PATCH | TRANSDERMAL | Status: DC
  Administered 2020-02-03: 1 via TRANSDERMAL
  Filled 2020-02-02 (×2): qty 1

## 2020-02-02 MED ORDER — PROSOURCE PLUS PO LIQD
30.0000 mL | Freq: Two times a day (BID) | ORAL | Status: DC
  Administered 2020-02-02 – 2020-02-07 (×6): 30 mL via ORAL
  Filled 2020-02-02 (×8): qty 30

## 2020-02-02 MED ORDER — FUROSEMIDE 40 MG PO TABS
40.0000 mg | ORAL_TABLET | Freq: Every day | ORAL | Status: DC
  Administered 2020-02-03 – 2020-02-08 (×6): 40 mg via ORAL
  Filled 2020-02-02 (×6): qty 1

## 2020-02-02 MED ORDER — AMIODARONE HCL 200 MG PO TABS
400.0000 mg | ORAL_TABLET | Freq: Three times a day (TID) | ORAL | Status: DC
  Administered 2020-02-02 – 2020-02-08 (×17): 400 mg via ORAL
  Filled 2020-02-02 (×17): qty 2

## 2020-02-02 MED ORDER — POLYETHYLENE GLYCOL 3350 17 G PO PACK
17.0000 g | PACK | Freq: Every day | ORAL | Status: DC | PRN
  Administered 2020-02-08: 17 g via ORAL
  Filled 2020-02-02 (×2): qty 1

## 2020-02-02 MED ORDER — B COMPLEX PO TABS: 1.0000 | ORAL_TABLET | Freq: Every day | ORAL | Status: DC

## 2020-02-02 MED ORDER — ALUM & MAG HYDROXIDE-SIMETH 200-200-20 MG/5ML PO SUSP: 30.0000 mL | ORAL | Status: DC | PRN

## 2020-02-02 MED ORDER — MELATONIN 3 MG PO TABS: 3.0000 mg | ORAL_TABLET | Freq: Every evening | ORAL | Status: DC | PRN

## 2020-02-02 MED ORDER — FE FUMARATE-B12-VIT C-FA-IFC PO CAPS
1.0000 | ORAL_CAPSULE | Freq: Two times a day (BID) | ORAL | Status: DC
  Administered 2020-02-02 – 2020-02-08 (×12): 1 via ORAL
  Filled 2020-02-02 (×12): qty 1

## 2020-02-02 MED ORDER — CALCIUM-VITAMIN D-VITAMIN K 500-1000-40 MG-UNT-MCG PO CHEW: 1.0000 | CHEWABLE_TABLET | Freq: Every day | ORAL | Status: DC

## 2020-02-02 MED ORDER — PROCHLORPERAZINE MALEATE 5 MG PO TABS: 5.0000 mg | ORAL_TABLET | Freq: Four times a day (QID) | ORAL | Status: DC | PRN

## 2020-02-02 MED ORDER — DIPHENHYDRAMINE HCL 12.5 MG/5ML PO ELIX: 12.5000 mg | ORAL_SOLUTION | Freq: Four times a day (QID) | ORAL | Status: DC | PRN

## 2020-02-02 MED ORDER — WARFARIN SODIUM 1 MG PO TABS
1.0000 mg | ORAL_TABLET | Freq: Every day | ORAL | Status: DC
  Filled 2020-02-02: qty 1

## 2020-02-02 MED ORDER — PROCHLORPERAZINE EDISYLATE 10 MG/2ML IJ SOLN: 5.0000 mg | Freq: Four times a day (QID) | INTRAMUSCULAR | Status: DC | PRN

## 2020-02-02 MED ORDER — POTASSIUM CHLORIDE CRYS ER 20 MEQ PO TBCR
20.0000 meq | EXTENDED_RELEASE_TABLET | Freq: Every day | ORAL | Status: DC
  Administered 2020-02-03 – 2020-02-08 (×6): 20 meq via ORAL
  Filled 2020-02-02 (×7): qty 1

## 2020-02-02 MED ORDER — PANTOPRAZOLE SODIUM 40 MG PO TBEC
40.0000 mg | DELAYED_RELEASE_TABLET | Freq: Every day | ORAL | Status: DC
  Administered 2020-02-03 – 2020-02-08 (×6): 40 mg via ORAL
  Filled 2020-02-02 (×6): qty 1

## 2020-02-02 MED ORDER — NYSTATIN 100000 UNIT/GM EX POWD
Freq: Two times a day (BID) | CUTANEOUS | Status: DC | PRN
  Filled 2020-02-02: qty 15

## 2020-02-02 MED ORDER — BOOST PLUS PO LIQD
237.0000 mL | Freq: Three times a day (TID) | ORAL | Status: DC
  Administered 2020-02-02 – 2020-02-08 (×15): 237 mL via ORAL
  Filled 2020-02-02 (×18): qty 237

## 2020-02-02 MED ORDER — WARFARIN SODIUM 1 MG PO TABS
1.0000 mg | ORAL_TABLET | Freq: Once | ORAL | Status: AC
  Administered 2020-02-02: 1 mg via ORAL
  Filled 2020-02-02: qty 1

## 2020-02-02 MED ORDER — WARFARIN - PHYSICIAN DOSING INPATIENT: Freq: Every day | Status: DC

## 2020-02-02 MED ORDER — CALCIUM CARBONATE-VITAMIN D 500-200 MG-UNIT PO TABS
1.0000 | ORAL_TABLET | Freq: Every day | ORAL | Status: DC
  Administered 2020-02-03 – 2020-02-08 (×6): 1 via ORAL
  Filled 2020-02-02 (×6): qty 1

## 2020-02-02 MED ORDER — FUROSEMIDE 40 MG PO TABS: 40.0000 mg | ORAL_TABLET | Freq: Every day | ORAL | 0 refills | Status: DC

## 2020-02-02 MED ORDER — OXYCODONE HCL 5 MG/5ML PO SOLN
5.0000 mg | ORAL | Status: DC | PRN
  Administered 2020-02-02 – 2020-02-08 (×22): 5 mg via ORAL
  Filled 2020-02-02 (×23): qty 5

## 2020-02-02 NOTE — Progress Notes (Signed)
PMR Admission Coordinator Pre-Admission Assessment   Patient: [name] is an 74 y.o., female MRN: [id number] DOB: [birth year] Height: 5' 6" (167.6 cm) Weight: 55.4 kg   Insurance Information HMO:     PPO:      PCP:      IPA:      80/20:      OTHER:  PRIMARY: Medicare A and B      Policy#: [id number]      Subscriber: pt CM Name:       Phone#:      Fax#:  Pre-Cert#: verified Civil engineer, contracting:  Benefits:  Phone #:      Name:  Eff. Date: 07/07/09 A and B     Deduct: $1484      Out of Pocket Max: n/a      Life Max: n/a CIR: 100%      SNF: 20 full days  Outpatient: 80%     Co-Pay: 20% Home Health: 100%      Co-Pay:  DME: 80%     Co-Pay: 20% Providers: pt choice  SECONDARY: [employer]      Policy#: [id number]     Phone#: [phone]   Financial Counselor:       Phone#:    The "Data Collection Information Summary" for patients in Inpatient Rehabilitation Facilities with attached "Privacy Act [place] Records" was provided and verbally reviewed with: Patient and Family   Emergency Contact Information         Contact Information     Name Relation Home Work Mobile    El [place] Sister [phone]   [phone]         Current Medical History  Patient Admitting Diagnosis: functional decline follow mitral valve replacement and maze procedure    History of Present Illness: [name] is a 74 year old female with history of HTN, BPPV, anxiety d/o, chronic pain, symptomatic PAF with SOB and recent work up showing MVP with large flail segment and severe regurgitation. She was admitted on 01/17/20 for median sternotomy, minimally invasive MVR and Maze procedure by Dr. Roxy Manns. Post op course complicated by  Volume overload requiring neo and Milrinone. ABLA with heme positive stools--H/H monitored and stable.  AFib with RVR/Flutter despite Tikosyn and Cardizem added for rate control. She has had hypotension therefore [name] consulted for assistance and patient underwent DCCV by Dr.  Audie Box on 07/22. She continues to have pleuritic chest pain and went back in RVR on 07/23 and IV amiodarone and milirinone added with conversion back to NSR.  Continue amio TID and lopressor for rate control.  She developed supra-therapeutic INR (5.6_ and was treated with Vitamin K with f/u PT/INR 1.5.  Resumed coumadin at 1 mg daily.  INR goal 2.5-3.5.  Therapy consults were completed and pt demonstrated functional decline due to prolonged hospitalization.  MD requested evaluation for CIR.     Patient's medical record from [facility] has been reviewed by the rehabilitation admission coordinator and physician.   Past Medical History      Past Medical History:  Diagnosis Date  . Anxiety    . Arthritis      "maybe in my back" (03/31/2018)  . Benign paroxysmal positional vertigo 06/08/2013  . Complication of anesthesia    . Fracture of multiple ribs 2015    "don't know from what; dx'd when I in hospital for 1st back OR" (03/31/2018)  . GERD (gastroesophageal reflux disease)    . Hair  loss 04/12/2012  . Herpes    . History of blood transfusion      "twice; related to back OR" (03/31/2018)  . History of kidney stones    . Interstitial cystitis 11/06/2011  . 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 Mitral regurgitation    . Osteopenia 02/18/2012  . Osteoporosis    . PAF (paroxysmal atrial fibrillation) ([place]) 2012  . Peripheral neuropathy 11/06/2011  . PMDD (premenstrual dysphoric disorder)    . PONV (postoperative nausea and vomiting)      nausea, vomiting, hives and dizziness   . 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/2020    Complete bilateral atrial lesion set using cryothermy and bipolar radiofrequency ablation with clipping of LA appendage via right mini-thoracotomy approach  . 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    Complex valvuloplasty including artificial Gore-tex neochord placement x12 with 66m Sorin Memo 4D ring annuloplasty  . Seasonal allergies    . Vaginal  delivery      ONE NSVD  . 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   Prior Rehab/Hospitalizations Has the patient had prior rehab or hospitalizations prior to admission? Yes   Has the patient had major surgery during 100 days prior to admission? Yes              Current Medications   Current Facility-Administered Medications:  .  amiodarone (PACERONE) tablet 400 mg, 400 mg, Oral, Q8H, KDeboraha Sprang MD, 400 mg at 02/02/20 0653 .  Chlorhexidine Gluconate Cloth 2 % PADS 6 each, 6 each, Topical, Daily, O'Neal, WCassie Freer MD, 6 each at 01/30/20 1221 .  clonazePAM (KLONOPIN) tablet 0.5 mg, 0.5 mg, Oral, BID, O'Neal, WCassie Freer MD, 0.5 mg at 02/02/20 1023 .  cyclobenzaprine (FLEXERIL) tablet 10 mg, 10 mg, Oral, QHS PRN, O'Neal, WCassie Freer MD, 10 mg at 02/02/20 0218 .  fentaNYL (DURAGESIC) 25 MCG/HR 1 patch, 1 patch, Transdermal, Q72H, O'Neal, WCassie Freer MD, 1 patch at 01/31/20 2228 .  ferrous [id number]-folic acid (TRINSICON / FOLTRIN) capsule 1 capsule, 1 capsule, Oral, Q breakfast, O'Neal, WCassie Freer MD, 1 capsule at 02/02/20 1022 .  furosemide (LASIX) tablet 40 mg, 40 mg, Oral, Daily, ORexene Alberts MD, 40 mg at 02/02/20 1023 .  gabapentin (NEURONTIN) capsule 100 mg, 100 mg, Oral, TID PRN, OGeralynn Rile MD, 100 mg at 02/01/20 1532 .  lactose free nutrition (BOOST PLUS) liquid 237 mL, 237 mL, Oral, TID WC, ORexene Alberts MD .  magic mouthwash, 5 mL, Oral, TID PRN, OAudie Box WCassie Freer MD, 5 mL at 01/22/20 1340 .  magnesium oxide (MAG-OX) tablet 200 mg, 200 mg, Oral, Daily, O'Neal, WCassie Freer MD, 200 mg at 02/02/20 1022 .  MEDLINE mouth rinse, 15 mL, Mouth Rinse, BID, O'Neal,  WCassie Freer MD, 15 mL at 02/01/20 2255 .  menthol-cetylpyridinium (CEPACOL) lozenge 3 mg, 1 lozenge, Oral, PRN, O'Neal, WCassie Freer MD, 3 mg at 01/25/20 0909 .  metoprolol tartrate (LOPRESSOR) injection 2.5-5 mg, 2.5-5 mg, Intravenous, Q2H PRN, O'Neal, WCassie Freer MD, 5 mg at 01/28/20 2044 .  metoprolol tartrate (LOPRESSOR) tablet 50 mg, 50 mg, Oral, BID, O'Neal, WCassie Freer MD, 50 mg at 02/02/20 1023 .  Muscle Rub CREA, , Topical, PRN, Nani Skillern, PA-C, 1 application at 53/97/67 1119 .  ondansetron (ZOFRAN) injection 4 mg, 4 mg, Intravenous, Q6H PRN, O'Neal, Cassie Freer, MD, 4 mg at 01/27/20 1053 .  oxyCODONE (ROXICODONE) 5 MG/5ML solution 5 mg, 5 mg, Oral, Q4H PRN, Gold, Wayne E, PA-C, 5 mg at 02/02/20 1024 .  pantoprazole (PROTONIX) EC tablet 40 mg, 40 mg, Oral, Daily, O'Neal, Cassie Freer, MD, 40 mg at 02/02/20 1023 .  potassium chloride SA (KLOR-CON) CR tablet 20 mEq, 20 mEq, Oral, Daily, Rexene Alberts, MD, 20 mEq at 02/02/20 1022 .  sodium chloride flush (NS) 0.9 % injection 3 mL, 3 mL, Intravenous, Q12H, O'Neal, Cassie Freer, MD, 3 mL at 02/02/20 1030 .  sodium chloride flush (NS) 0.9 % injection 3 mL, 3 mL, Intravenous, PRN, O'Neal, Cassie Freer, MD, 3 mL at 02/01/20 2255 .  warfarin (COUMADIN) tablet 1 mg, 1 mg, Oral, q1600, Rexene Alberts, MD .  Warfarin - Physician Dosing Inpatient, , Does not apply, q1600, O'Neal, Cassie Freer, MD, Given at 01/23/20 1538   Patients Current Diet:     Diet Order                      Diet heart healthy/carb modified Room service appropriate? No; Fluid consistency: Thin  Diet effective now                      Precautions / Restrictions Precautions Precautions: Sternal, Fall Precaution Booklet Issued: Yes (comment) Precaution Comments: verbally reviewed/educated during session, watch HR and O2 Restrictions Weight Bearing Restrictions: No Other Position/Activity Restrictions: Sternal precautions    Has the  patient had 2 or more falls or a fall with injury in the past year? Yes   Prior Activity Level Limited Community (1-2x/wk): still driving prior to admit, not using ambulatory aid, but was using AE for ADLs due to prior history of spinal surgeries   Prior Functional Level Self Care: Did the patient need help bathing, dressing, using the toilet or eating? Independent   Indoor Mobility: Did the patient need assistance with walking from room to room (with or without device)? Independent   Stairs: Did the patient need assistance with internal or external stairs (with or without device)? Independent   Functional Cognition: Did the patient need help planning regular tasks such as shopping or remembering to take medications? Independent   Home Assistive Devices / Equipment Home Assistive Devices/Equipment: Eyeglasses, Dentures (specify type), Other (Comment) Home Equipment: Cane - single point, Other (comment) (walking poles)   Prior Device Use: Indicate devices/aids used by the patient prior to current illness, exacerbation or injury? None of the above   Current Functional Level Cognition   Overall Cognitive Status: Impaired/Different from baseline Current Attention Level: Sustained Orientation Level: Oriented X4 Following Commands: Follows one step commands consistently, Follows one step commands with increased time Safety/Judgement: Decreased awareness of safety, Decreased awareness of deficits General Comments: pt with some ability to recall sternal precautions ("stay in the tube"), forgot that her sister had already been in to visit today    Extremity Assessment (includes Sensation/Coordination)   Upper Extremity Assessment: Generalized weakness  Lower Extremity Assessment: Defer to PT evaluation, Generalized weakness     ADLs   Overall ADL's : Needs assistance/impaired Eating/Feeding: Set up, Sitting, Bed level Grooming: Wash/dry face, Oral care, Brushing hair, Min guard, Standing,  Set up, Sitting Grooming Details (indicate cue type and reason): attempted to  have pt perform in standing, pt initating oral care in standing but due to fatigue needing to sit for completion of additional activities  Upper Body Bathing: Minimal assistance, Standing Lower Body Bathing: Moderate assistance, Sitting/lateral leans, Sit to/from stand Upper Body Dressing : Minimal assistance, Standing Lower Body Dressing: Moderate assistance, Cueing for safety, Sitting/lateral leans, Sit to/from stand Lower Body Dressing Details (indicate cue type and reason): pt reports she typically uses sock aide and reacher for LB dressing ADL  Toilet Transfer: Min guard, Minimal assistance, Ambulation, Regular Toilet Toilet Transfer Details (indicate cue type and reason): minA for sit<>stand Toileting- Clothing Manipulation and Hygiene: Minimal assistance, Sitting/lateral lean, Sit to/from stand, With adaptive equipment Toileting - Clothing Manipulation Details (indicate cue type and reason): unable to let go of RW  Functional mobility during ADLs: Min guard, Minimal assistance, Rolling walker General ADL Comments: Pt limited by decreased activity tolerance, decreased strength, decreased ability to care for self and decreased knowledge of precautions.     Mobility   Overal bed mobility: Needs Assistance Bed Mobility: Rolling, Sidelying to Sit, Sit to Sidelying Rolling: Supervision Sidelying to sit: Min guard Supine to sit: Supervision, HOB elevated Sit to supine: Min assist Sit to sidelying: Min guard General bed mobility comments: use of bed rail and cues to initiate and properly sequence through task to ensure carryover of precautions; HOB slightly elevated      Transfers   Overall transfer level: Needs assistance Equipment used: Rolling walker (2 wheeled) Transfers: Sit to/from Stand Sit to Stand: Min assist, Min guard General transfer comment: pt requiring boosting assist from EOB and from toilet, VCs  for safe hand placement, able to stand with minguard assist from [facility] in front of sink     Ambulation / Gait / Stairs / Wheelchair Mobility   Ambulation/Gait Ambulation/Gait assistance: Herbalist (Feet): 90 Feet Assistive device: Rolling walker (2 wheeled) Gait Pattern/deviations: Step-through pattern, Decreased stride length, Trunk flexed General Gait Details: guarding for safety with assist to direct RW at times. Pt on 1L throughout gait with sats 90-99%. HR 75-90 Gait velocity: decr Gait velocity interpretation: <1.8 ft/sec, indicate of risk for recurrent falls Stairs: Yes Stairs assistance: Min guard Stair Management: Step to pattern, Forwards, One rail Right Number of Stairs: 3 General stair comments: cues for hand placement, precautions and safety. Pt ascending forward and descending sideways with bil hands on rail     Posture / Balance Dynamic Sitting Balance Sitting balance - Comments: able to sit EOB with supervision Balance Overall balance assessment: Needs assistance Sitting-balance support: Feet supported Sitting balance-Leahy Scale: Fair Sitting balance - Comments: able to sit EOB with supervision Standing balance support: Bilateral upper extremity supported, Single extremity supported, During functional activity Standing balance-Leahy Scale: Poor Standing balance comment: Reliant on UE support     Special needs/care consideration Skin sternal incision, generalized bruising, Behavioral consideration anxiety and Designated visitor [name] and [name] (sisters)    Previous Home Environment (from acute therapy documentation) Living Arrangements: Alone Available Help at Discharge: Family, Available 24 hours/day Type of Home: House Home Layout: Two level, Able to live on main level with bedroom/bathroom Home Access: Stairs to enter Entrance Stairs-Rails: Right Entrance Stairs-Number of Steps: 3 Bathroom Shower/Tub: Chiropodist: Standard Home  Care Services: No   Discharge Living Setting Plans for Discharge Living Setting: Patient's home Type of Home at Discharge: House Discharge Home Layout: Able to live on main level with bedroom/bathroom Discharge Home Access: Stairs to enter Entrance  Stairs-Rails: Right Entrance Stairs-Number of Steps: 3 Discharge Bathroom Shower/Tub: Tub/shower unit Discharge Bathroom Toilet: Standard Discharge Bathroom Accessibility: Yes How Accessible: Accessible via walker Does the patient have any problems obtaining your medications?: No   Social/Family/Support Systems Anticipated Caregiver: sisters Eulogio Bear and [name] Anticipated Caregiver's Contact Information: [name] [phone] Ability/Limitations of Caregiver: up to min assist Caregiver Availability: 24/7 Discharge Plan Discussed with Primary Caregiver: Yes Is Caregiver In Agreement with Plan?: Yes Does Caregiver/Family have Issues with Lodging/Transportation while Pt is in Rehab?: No   Goals Patient/Family Goal for Rehab: PT/OT mod I Expected length of stay: 4-6 days Pt/Family Agrees to Admission and willing to participate: Yes Program Orientation Provided & Reviewed with Pt/Caregiver Including Roles  & Responsibilities: Yes   Decrease burden of Care through IP rehab admission: n/a   Possible need for SNF placement upon discharge: Not anticipated.    Patient Condition: I have reviewed medical records from [facility], spoken with CM, and patient and family member. I met with patient at the bedside for inpatient rehabilitation assessment.  Patient will benefit from ongoing PT and OT, can actively participate in 3 hours of therapy a day 5 days of the week, and can make measurable gains during the admission.  Patient will also benefit from the coordinated team approach during an Inpatient Acute Rehabilitation admission.  The patient will receive intensive therapy as well as Rehabilitation physician, nursing, social worker, and care  management interventions.  Due to safety, skin/wound care, disease management, medication administration, pain management and patient education the patient requires 24 hour a day rehabilitation nursing.  The patient is currently min assist to min guard with mobility and basic ADLs.  Discharge setting and therapy post discharge at home with home health is anticipated.  Patient has agreed to participate in the Acute Inpatient Rehabilitation Program and will admit today.   Preadmission Screen Completed By:  Michel Santee, PT, DPT 02/02/2020 11:00 AM ______________________________________________________________________   Discussed status with Dr. Ranell Patrick on 02/02/20  at 11:09 AM  and received approval for admission today.   Admission Coordinator:  Michel Santee, PT, DPT time 11:09 AM Sudie Grumbling 02/02/20     Assessment/Plan: Diagnosis: Debility s/p mitral valve repair 1. Does the need for close, 24 hr/day Medical supervision in concert with the patient's rehab needs make it unreasonable for this patient to be served in a less intensive setting? Yes 2. Co-Morbidities requiring supervision/potential complications: Elevated INR, GERD, anemia, paroxysmal atrial fibrillation, anxiety, depression, chronic pain, severe mitral regurgitation 3. Due to bladder management, bowel management, safety, skin/wound care, disease management, medication administration, pain management and patient education, does the patient require 24 hr/day rehab nursing? Yes 4. Does the patient require coordinated care of a physician, rehab nurse, PT, OT to address physical and functional deficits in the context of the above medical diagnosis(es)? Yes Addressing deficits in the following areas: balance, endurance, locomotion, strength, transferring, bowel/bladder control, bathing, dressing, feeding, grooming, toileting, cognition, speech, language, swallowing and psychosocial support 5. Can the patient actively participate in an intensive  therapy program of at least 3 hrs of therapy 5 days a week? Yes 6. The potential for patient to make measurable gains while on inpatient rehab is excellent 7. Anticipated functional outcomes upon discharge from inpatient rehab: modified independent PT, modified independent OT, independent SLP 8. Estimated rehab length of stay to reach the above functional goals is: 5-7 days 9. Anticipated discharge destination: Home 10. Overall Rehab/Functional Prognosis: excellent     MD Signature:  Leeroy Cha, MD

## 2020-02-02 NOTE — Progress Notes (Signed)
CARDIAC REHAB PHASE I   PRE:  Rate/Rhythm: 94 SR  BP:  Sitting: 121/67      SaO2: 95 RA  MODE:  Ambulation: 140 ft   POST:  Rate/Rhythm: 105 ST  BP:  Sitting: 118/71    SaO2: 90 RA  Pt ambulated 126ft in hallway assist of one with gait belt and rollator. Pt in better spirits today, stating she feels stronger. Pt returned to chair, bedside table and call light within reach. Pt requesting rolling walker, CM made aware. Will continue to follow.  [phone] Regina Falco, RN BSN 02/02/2020 9:40 AM
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
## 2020-02-02 NOTE — Progress Notes (Addendum)
Report given to RN on [facility]. Two midsternal sutures and two sutures under right breast removed. Patient tolerated well. Patient transferred to [facility] at this time.

## 2020-02-02 NOTE — Progress Notes (Addendum)
      [address]       [place] [address]             [phone]      7 Days Post-Op Procedure(s) (LRB): TRANSESOPHAGEAL ECHOCARDIOGRAM (TEE) (N/A) CARDIOVERSION (N/A)   Subjective:  No new complaints.  Pain remains unchanged.  Objective: Vital signs in last 24 hours: Temp:  [97.4 F (36.3 C)-98.4 F (36.9 C)] 98.4 F (36.9 C) (07/29 0309) Pulse Rate:  [75-145] 87 (07/29 0309) Cardiac Rhythm: Normal sinus rhythm (07/28 1900) Resp:  [19-31] 22 (07/29 0309) BP: (93-120)/(42-67) 103/46 (07/29 0309) SpO2:  [85 %-99 %] 99 % (07/29 0309) Weight:  [55.4 kg] 55.4 kg (07/29 0500)  Intake/Output from previous day: 07/28 0701 - 07/29 0700 In: -  Out: 200 [Urine:200]  General appearance: alert, cooperative and no distress Heart: regular rate and rhythm Lungs: clear to auscultation bilaterally Abdomen: soft, non-tender; bowel sounds normal; no masses,  no organomegaly Extremities: extremities normal, atraumatic, no cyanosis or edema Wound: clean and dry  Lab Results: Recent Labs    02/01/20 0503  WBC 9.0  HGB 8.4*  HCT 28.2*  PLT 264   BMET:  Recent Labs    02/01/20 0503 02/02/20 0252  NA 138 139  K 4.8 5.0  CL 95* 95*  CO2 35* 35*  GLUCOSE 103* 124*  BUN 13 13  CREATININE 0.70 0.77  CALCIUM 7.7* 8.4*    PT/INR:  Recent Labs    02/02/20 0252  LABPROT 17.8*  INR 1.5*   ABG    Component Value Date/Time   PHART 7.235 (L) 01/18/2020 0106   HCO3 21.7 01/18/2020 0106   TCO2 27 01/19/2020 2206   ACIDBASEDEF 5.0 (H) 01/18/2020 0106   O2SAT 76.5 01/20/2020 0436   CBG (last 3)  No results for input(s): GLUCAP in the last 72 hours.  Assessment/Plan: S/P Procedure(s) (LRB): TRANSESOPHAGEAL ECHOCARDIOGRAM (TEE) (N/A) CARDIOVERSION (N/A)  1. CV- PAF, maintaining NSR- continue Amiodarone 2. INR 1.5 after vitamin K yesterday, will resume coumadin at 1 mg daily 3. Pulm- no acute issues, off oxygen, continue IS 4. Renal- creatinine WNL,  K is at 5.0, will stop potassium supplement 5. Deconditioning- patient being evaluated for CIR... if not a candidate will be going home with H/H in care of her family 13. Dispo- patient medically stable, resume coumadin, patient is medically stable for discharge to CIR vs. H/H   LOS: 16 days    Ellwood Handler, PA-C 02/02/2020   Chart reviewed, patient examined, agree with above. INR down to 1.5 after vit K yesterday. Continuing Coumadin at lower dose. Goal INR 2-2.5.  She is probably appropriate for CIR or home with [place] therapies. She would like to go home with family if that is a safe option. I think she is medically stable for either one. Discussed with patient and sister at bedside. They are thinking about it.

## 2020-02-02 NOTE — Progress Notes (Signed)
Inpatient Rehabilitation Medication Review by a Pharmacist  A complete drug regimen review was completed for this patient to identify any potential clinically significant medication issues.  Clinically significant medication issues were identified:  yes   Type of Medication Issue Identified Description of Issue Urgent (address now) Non-Urgent (address on AM team rounds) Plan Plan Accepted by Provider? (Yes / No / Pending AM Rounds)  Drug Interaction(s) (clinically significant)       Duplicate Therapy       Allergy       No Medication Administration End Date       Incorrect Dose  - potassium dose 81meq vs 2meq (consider holding potassium as K level is 5.0 on 7/29) - clarify amiodarone TID vs BID (in d/c summary as TID and ordered as TID, but CIR H&P states BID) Non-urgent Contact provider on AM rounds pending  Additional Drug Therapy Needed  - need to resume aspirin 81mg . Per d/c summary "She can be taken off ASA once her INR is above 2.0." INR on 7/29 is 1.5. Non-urgent Contact provider on AM rounds pending  Other  - clarify INR goal. Per d/c summary "goal INR range 2.5-3.5" but CIR H&P states "INR goal 2-2.5" - clarify if warfarin dosing per physician or per pharmacy (CIR H&P states "pharmacy to assist with management" of warfarin, but no warfarin consult entered Non-urgent Contact provider on AM rounds pending     Pharmacist comments: several nonurgent medication issues identified. Pharmacy will contact CIR provider on rounds and clarify above findings.  Time spent performing this drug regimen review (minutes):  20 mins    Brendolyn Patty, PharmD Clinical Pharmacist  02/02/2020   4:53 PM   Please check AMION for all [facility] phone numbers After 10:00 PM, call the [facility] [phone]

## 2020-02-02 NOTE — Progress Notes (Addendum)
ANTICOAGULATION CONSULT NOTE - Initial Consult  Pharmacy Consult for Warfarin Indication: Atrial Fibrillation, Mitral Valve Repair  Allergies  Allergen Reactions  . Buprenorphine Nausea Only and Other (See Comments)    sedation and adhesive reaction  . Contrast Media [Iodinated Diagnostic Agents] Hives  . Cymbalta [Duloxetine Hcl]     Severe diarrhea and upset stomach  . Erythromycin Nausea And Vomiting    Dizziness   . Hydrocodone     Sedation,dizziness, and nausea  . Hydromorphone     Sedation,dizziness, and nausea  . Latex      hives and sores  . Levofloxacin Nausea And Vomiting  . Metrizamide Hives  . Nsaids     CANNOT TAKE PER CARDIOLOGIST DUE TO AFIB   . Nucynta [Tapentadol Hcl]     nausea and sedation  . Other     CAN NOT TAKE , aLPRAZOLAM, OR ELAVIL DUE TO AFIB FOR ANXIETY  IV CONTRAST /"DYE".  . Oxycodone     Delusions (intolerance)  PILLS ONLY sedation, dizziness, nausea,  and itching  . Pentazocine     Unknown  . Septra [Bactrim]     Hives   . Sulfa Antibiotics Hives    rash  . Sulfasalazine Hives  . Adhesive [Tape] Rash and Other (See Comments)    Heart monitor stickers must be rotated in order to prevent rash  . Morphine Anxiety    sedation and nausea    Patient Measurements: Total Body Weight: 55.4 kg Height: 66 inches  Vital Signs: Temp: 98.4 F (36.9 C) (07/29 1256) Temp Source: Oral (07/29 1256) BP: 131/65 (07/29 1426) Pulse Rate: 81 (07/29 1426)  Labs: Recent Labs    01/31/20 0544 02/01/20 0503 02/02/20 0252  HGB  --  8.4*  --   HCT  --  28.2*  --   PLT  --  264  --   LABPROT 37.6* 49.0* 17.8*  INR 4.0* 5.6* 1.5*  CREATININE  --  0.70 0.77    Estimated Creatinine Clearance: 53.1 mL/min (by C-G formula based on SCr of 0.77 mg/dL).   Medical History: Past Medical History:  Diagnosis Date  . Anxiety   . Arthritis    "maybe in my back" (03/31/2018)  . Benign paroxysmal positional vertigo 06/08/2013  . Complication of  anesthesia   . Fracture of multiple ribs 2015   "don't know from what; dx'd when I in hospital for 1st back OR" (03/31/2018)  . GERD (gastroesophageal reflux disease)   . Hair loss 04/12/2012  . Herpes   . History of blood transfusion    "twice; related to back OR" (03/31/2018)  . History of kidney stones   . Interstitial cystitis 11/06/2011  . 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 Mitral regurgitation   . Osteopenia 02/18/2012  . Osteoporosis   . PAF (paroxysmal atrial fibrillation) ([place]) 2012  . Peripheral neuropathy 11/06/2011  . PMDD (premenstrual dysphoric disorder)   . PONV (postoperative nausea and vomiting)    nausea, vomiting, hives and dizziness   . 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/2020   Complete bilateral atrial lesion set using cryothermy and bipolar radiofrequency ablation with clipping of LA appendage via right mini-thoracotomy approach  . 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 Seasonal allergies   . Vaginal delivery    ONE NSVD  . 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 She was started on warfarin for mitral valve repair, which was interrupted due to dark stools/FOBT positive. Warfarin was restarted at 2 mg daily, and INR trended up to 5.6 yesterday, for which pt rec'd vitamin K. Follow up INR was 1.5 and she was resumed on warfarin 1 mg po daily (goal range 2.5-3.5, per Dr. Roxy Manns discharge summary). Pharmacy was consulted by Rehab Medicine to dose warfarin.  H/H 8.4/28.2/plt 264; INR 1.5 today. Per rehab RN, no bleeding issues observed. Patient is on amiodarone, which likely contributed to elevated INR. Pt is on heart-healthy diet.  Goal of Therapy:  INR: 2.5-3.5 Monitor platelets by  anticoagulation protocol: Yes   Plan:  Warfarin 1 mg PO X 1 today Monitor daily INR, CBC Monitor for signs/symptoms of bleeding  Gillermina Hu, PharmD, BCPS, [facility] Clinical Pharmacist 02/02/2020,4:35 PM

## 2020-02-02 NOTE — Progress Notes (Signed)
Patient had a hard time swallowing big pills with apple sauce. Coughing noted.  Per patient she takes meds with apple sauce.

## 2020-02-02 NOTE — TOC Transition Note (Signed)
Transition of Care (TOC) - CM/SW Discharge Note Marvetta Gibbons RN, BSN Transitions of Care Unit 4E- RN Case Manager See Treatment Team for direct phone #    Patient Details  Name: [name] MRN: [id number] Date of Birth: February 11, 1946  Transition of Care [facility] CM/SW Contact:  Dawayne Patricia, RN Phone Number: 02/02/2020, 3:25 PM   Clinical Narrative:    Pt and sister have elected to accept a bed offer for [facility] and pt will transition to [facility] rehab instead of going home with [facility]. Bed available today per [facility] with CIR.  D/C order has been placed and pt to transition later today to [facility] rehab, will alert Butch Penny with [facility] of transition at this time-   Final next level of care: [place] Barriers to Discharge: Barriers Resolved   Patient Goals and CMS Choice Patient states their goals for this hospitalization and ongoing recovery are:: Just want to get home and get this behind me CMS Medicare.gov Compare Post Acute Care list provided to:: Patient Choice offered to / list presented to : Sibling  Discharge Placement                [facility]        Discharge Plan and Services   Discharge Planning Services: CM Consult Post Acute Care Choice: Home Health                    HH Arranged: RN, PT [facility] Agency: [place] ([facility]) Date [place]: 01/27/20 Time [place]: 1130 Representative spoke with at [place]: [place] ([place]) Interventions     Readmission Risk Interventions No flowsheet data found.

## 2020-02-02 NOTE — Research (Signed)
TRAC-AF Registry Informed Consent   Subject Name: [name]  Subject met inclusion and exclusion criteria.  The informed consent form, registry requirements and expectations were reviewed with the subject and questions and concerns were addressed prior to the signing of the consent form.  The subject verbalized understanding of the registry requirements.  The subject agreed to participate in the [place] registry and signed the informed consent.  The informed consent was obtained prior to performance of any protocol-specific procedures for the subject.  A copy of the signed informed consent was given to the subject and a copy was placed in the subject's medical record.  [name] 02/02/2020, 1:17 PM

## 2020-02-02 NOTE — Progress Notes (Signed)
Inpatient Rehab Admissions Coordinator:   I have a bed available and pt and family in agreement for short stay on CIR prior to returning home.  Erin Barrett, PA-C, in agreement.  Will let TOC team know.   Shann Medal, PT, DPT Admissions Coordinator [phone] 02/02/20  10:46 AM

## 2020-02-02 NOTE — PMR Pre-admission (Signed)
PMR Admission Coordinator Pre-Admission Assessment   Patient: [name] is an 74 y.o., female MRN: [id number] DOB: 12/02/1945 Height: 5' 6" (167.6 cm) Weight: 55.4 kg   Insurance Information HMO:     PPO:      PCP:      IPA:      80/20:      OTHER:  PRIMARY: Medicare A and B      Policy#: [id number]      Subscriber: pt CM Name:       Phone#:      Fax#:  Pre-Cert#: verified online      Employer:  Benefits:  Phone #:      Name:  Eff. Date: 07/07/09 A and B     Deduct: $1484      Out of Pocket Max: n/a      Life Max: n/a CIR: 100%      SNF: 20 full days  Outpatient: 80%     Co-Pay: 20% Home Health: 100%      Co-Pay:  DME: 80%     Co-Pay: 20% Providers: pt choice  SECONDARY: A[employer]      Policy#: [id number]     Phone#: [phone]   Financial Counselor:       Phone#:    The "Data Collection Information Summary" for patients in Inpatient Rehabilitation Facilities with attached "Privacy Act Statement-Health Care Records" was provided and verbally reviewed with: Patient and Family   Emergency Contact Information         Contact Information     Name Relation Home Work Mobile    Brown,Jo Sister [phone]   [phone]         Current Medical History  Patient Admitting Diagnosis: functional decline follow mitral valve replacement and maze procedure    History of Present Illness: [name] is a 74 year old female with history of HTN, BPPV, anxiety d/o, chronic pain, symptomatic PAF with SOB and recent work up showing MVP with large flail segment and severe regurgitation. She was admitted on 01/17/20 for median sternotomy, minimally invasive MVR and Maze procedure by Dr. Owen. Post op course complicated by  Volume overload requiring neo and Milrinone. ABLA with heme positive stools--H/H monitored and stable.  AFib with RVR/Flutter despite Tikosyn and Cardizem added for rate control. She has had hypotension therefore Dr. Camnitz consulted for assistance and patient underwent DCCV by Dr.  O'Neal on 07/22. She continues to have pleuritic chest pain and went back in RVR on 07/23 and IV amiodarone and milirinone added with conversion back to NSR.  Continue amio TID and lopressor for rate control.  She developed supra-therapeutic INR (5.6_ and was treated with Vitamin K with f/u PT/INR 1.5.  Resumed coumadin at 1 mg daily.  INR goal 2.5-3.5.  Therapy consults were completed and pt demonstrated functional decline due to prolonged hospitalization.  MD requested evaluation for CIR.     Patient's medical record from [facility] has been reviewed by the rehabilitation admission coordinator and physician.   Past Medical History      Past Medical History:  Diagnosis Date  . Anxiety    . Arthritis      "maybe in my back" (03/31/2018)  . Benign paroxysmal positional vertigo 06/08/2013  . Complication of anesthesia    . Fracture of multiple ribs 2015    "don't know from what; dx'd when I in hospital for 1st back OR" (03/31/2018)  . GERD (gastroesophageal reflux disease)    . Hair   loss 04/12/2012  . Herpes    . History of blood transfusion      "twice; related to back OR" (03/31/2018)  . History of kidney stones    . Interstitial cystitis 11/06/2011  . Melanoma of ankle (HCC) ~ 2003    "right"  . Mitral regurgitation    . Osteopenia 02/18/2012  . Osteoporosis    . PAF (paroxysmal atrial fibrillation) (HCC) 2012  . Peripheral neuropathy 11/06/2011  . PMDD (premenstrual dysphoric disorder)    . PONV (postoperative nausea and vomiting)      nausea, vomiting, hives and dizziness   . 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/2020    Complete bilateral atrial lesion set using cryothermy and bipolar radiofrequency ablation with clipping of LA appendage via right mini-thoracotomy approach  . 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 Seasonal allergies    . Vaginal  delivery      ONE NSVD  . 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   Prior Rehab/Hospitalizations Has the patient had prior rehab or hospitalizations prior to admission? Yes   Has the patient had major surgery during 100 days prior to admission? Yes              Current Medications   Current Facility-Administered Medications:  .  amiodarone (PACERONE) tablet 400 mg, 400 mg, Oral, Q8H, Klein, Steven C, MD, 400 mg at 02/02/20 0653 .  Chlorhexidine Gluconate Cloth 2 % PADS 6 each, 6 each, Topical, Daily, O'Neal, [place] Thomas, MD, 6 each at 01/30/20 1221 .  clonazePAM (KLONOPIN) tablet 0.5 mg, 0.5 mg, Oral, BID, O'Neal, [place] Thomas, MD, 0.5 mg at 02/02/20 1023 .  cyclobenzaprine (FLEXERIL) tablet 10 mg, 10 mg, Oral, QHS PRN, O'Neal, [place] Thomas, MD, 10 mg at 02/02/20 0218 .  fentaNYL (DURAGESIC) 25 MCG/HR 1 patch, 1 patch, Transdermal, Q72H, O'Neal, [place] Thomas, MD, 1 patch at 01/31/20 2228 .  ferrous fumarate-b12-vitamic C-folic acid (TRINSICON / FOLTRIN) capsule 1 capsule, 1 capsule, Oral, Q breakfast, O'Neal, [place]rest[place] Thomas, MD, 1 capsule at 02/02/20 1022 .  furosemide (LASIX) tablet 40 mg, 40 mg, Oral, Daily, Owen, Clarence H, MD, 40 mg at 02/02/20 1023 .  gabapentin (NEURONTIN) capsule 100 mg, 100 mg, Oral, TID PRN, O'Neal, [place] Thomas, MD, 100 mg at 02/01/20 1532 .  lactose free nutrition (BOOST PLUS) liquid 237 mL, 237 mL, Oral, TID WC, Owen, Clarence H, MD .  magic mouthwash, 5 mL, Oral, TID PRN, O'Neal, [place] Thomas, MD, 5 mL at 01/22/20 1340 .  magnesium oxide (MAG-OX) tablet 200 mg, 200 mg, Oral, Daily, O'Neal, [place] Thomas, MD, 200 mg at 02/02/20 1022 .  MEDLINE mouth rinse, 15 mL, Mouth Rinse, BID, O'Neal,  [place] Thomas, MD, 15 mL at 02/01/20 2255 .  menthol-cetylpyridinium (CEPACOL) lozenge 3 mg, 1 lozenge, Oral, PRN, O'Neal, [place] Thomas, MD, 3 mg at 01/25/20 0909 .  metoprolol tartrate (LOPRESSOR) injection 2.5-5 mg, 2.5-5 mg, Intravenous, Q2H PRN, O'Neal, [place] Thomas, MD, 5 mg at 01/28/20 2044 .  metoprolol tartrate (LOPRESSOR) tablet 50 mg, 50 mg, Oral, BID, O'Neal,  Thomas, MD, 50 mg at 02/02/20 1023 .    Muscle Rub CREA, , Topical, PRN, Zimmerman, Donielle M, PA-C, 1 application at 01/28/20 1119 .  ondansetron (ZOFRAN) injection 4 mg, 4 mg, Intravenous, Q6H PRN, O'Neal, [place] Thomas, MD, 4 mg at 01/27/20 1053 .  oxyCODONE (ROXICODONE) 5 MG/5ML solution 5 mg, 5 mg, Oral, Q4H PRN, Gold, Wayne E, PA-C, 5 mg at 02/02/20 1024 .  pantoprazole (PROTONIX) EC tablet 40 mg, 40 mg, Oral, Daily, O'Neal, [place] Thomas, MD, 40 mg at 02/02/20 1023 .  potassium chloride SA (KLOR-CON) CR tablet 20 mEq, 20 mEq, Oral, Daily, Owen, Clarence H, MD, 20 mEq at 02/02/20 1022 .  sodium chloride flush (NS) 0.9 % injection 3 mL, 3 mL, Intravenous, Q12H, O'Neal, [place] Thomas, MD, 3 mL at 02/02/20 1030 .  sodium chloride flush (NS) 0.9 % injection 3 mL, 3 mL, Intravenous, PRN, O'Neal, [place] Thomas, MD, 3 mL at 02/01/20 2255 .  warfarin (COUMADIN) tablet 1 mg, 1 mg, Oral, q1600, Owen, Clarence H, MD .  Warfarin - Physician Dosing Inpatient, , Does not apply, q1600, O'Neal, [place] Thomas, MD, Given at 01/23/20 1538   Patients Current Diet:     Diet Order                      Diet heart healthy/carb modified Room service appropriate? No; Fluid consistency: Thin  Diet effective now                      Precautions / Restrictions Precautions Precautions: Sternal, Fall Precaution Booklet Issued: Yes (comment) Precaution Comments: verbally reviewed/educated during session, watch HR and O2 Restrictions Weight Bearing Restrictions: No Other Position/Activity Restrictions: Sternal precautions    Has the  patient had 2 or more falls or a fall with injury in the past year? Yes   Prior Activity Level Limited Community (1-2x/wk): still driving prior to admit, not using ambulatory aid, but was using AE for ADLs due to prior history of spinal surgeries   Prior Functional Level Self Care: Did the patient need help bathing, dressing, using the toilet or eating? Independent   Indoor Mobility: Did the patient need assistance with walking from room to room (with or without device)? Independent   Stairs: Did the patient need assistance with internal or external stairs (with or without device)? Independent   Functional Cognition: Did the patient need help planning regular tasks such as shopping or remembering to take medications? Independent   Home Assistive Devices / Equipment Home Assistive Devices/Equipment: Eyeglasses, Dentures (specify type), Other (Comment) Home Equipment: Cane - single point, Other (comment) (walking poles)   Prior Device Use: Indicate devices/aids used by the patient prior to current illness, exacerbation or injury? None of the above   Current Functional Level Cognition   Overall Cognitive Status: Impaired/Different from baseline Current Attention Level: Sustained Orientation Level: Oriented X4 Following Commands: Follows one step commands consistently, Follows one step commands with increased time Safety/Judgement: Decreased awareness of safety, Decreased awareness of deficits General Comments: pt with some ability to recall sternal precautions ("stay in the tube"), forgot that her sister had already been in to visit today    Extremity Assessment (includes Sensation/Coordination)   Upper Extremity Assessment: Generalized weakness  Lower Extremity Assessment: Defer to PT evaluation, Generalized weakness     ADLs   Overall ADL's : Needs assistance/impaired Eating/Feeding: Set up, Sitting, Bed level Grooming: Wash/dry face, Oral care, Brushing hair, Min guard, Standing,  Set up, Sitting Grooming Details (indicate cue type and reason): attempted to   have pt perform in standing, pt initating oral care in standing but due to fatigue needing to sit for completion of additional activities  Upper Body Bathing: Minimal assistance, Standing Lower Body Bathing: Moderate assistance, Sitting/lateral leans, Sit to/from stand Upper Body Dressing : Minimal assistance, Standing Lower Body Dressing: Moderate assistance, Cueing for safety, Sitting/lateral leans, Sit to/from stand Lower Body Dressing Details (indicate cue type and reason): pt reports she typically uses sock aide and reacher for LB dressing ADL  Toilet Transfer: Min guard, Minimal assistance, Ambulation, Regular Toilet Toilet Transfer Details (indicate cue type and reason): minA for sit<>stand Toileting- Clothing Manipulation and Hygiene: Minimal assistance, Sitting/lateral lean, Sit to/from stand, With adaptive equipment Toileting - Clothing Manipulation Details (indicate cue type and reason): unable to let go of RW  Functional mobility during ADLs: Min guard, Minimal assistance, Rolling walker General ADL Comments: Pt limited by decreased activity tolerance, decreased strength, decreased ability to care for self and decreased knowledge of precautions.     Mobility   Overal bed mobility: Needs Assistance Bed Mobility: Rolling, Sidelying to Sit, Sit to Sidelying Rolling: Supervision Sidelying to sit: Min guard Supine to sit: Supervision, HOB elevated Sit to supine: Min assist Sit to sidelying: Min guard General bed mobility comments: use of bed rail and cues to initiate and properly sequence through task to ensure carryover of precautions; HOB slightly elevated      Transfers   Overall transfer level: Needs assistance Equipment used: Rolling walker (2 wheeled) Transfers: Sit to/from Stand Sit to Stand: Min assist, Min guard General transfer comment: pt requiring boosting assist from EOB and from toilet, VCs  for safe hand placement, able to stand with minguard assist from BSC in front of sink     Ambulation / Gait / Stairs / Wheelchair Mobility   Ambulation/Gait Ambulation/Gait assistance: Min assist Gait Distance (Feet): 90 Feet Assistive device: Rolling walker (2 wheeled) Gait Pattern/deviations: Step-through pattern, Decreased stride length, Trunk flexed General Gait Details: guarding for safety with assist to direct RW at times. Pt on 1L throughout gait with sats 90-99%. HR 75-90 Gait velocity: decr Gait velocity interpretation: <1.8 ft/sec, indicate of risk for recurrent falls Stairs: Yes Stairs assistance: Min guard Stair Management: Step to pattern, Forwards, One rail Right Number of Stairs: 3 General stair comments: cues for hand placement, precautions and safety. Pt ascending forward and descending sideways with bil hands on rail     Posture / Balance Dynamic Sitting Balance Sitting balance - Comments: able to sit EOB with supervision Balance Overall balance assessment: Needs assistance Sitting-balance support: Feet supported Sitting balance-Leahy Scale: Fair Sitting balance - Comments: able to sit EOB with supervision Standing balance support: Bilateral upper extremity supported, Single extremity supported, During functional activity Standing balance-Leahy Scale: Poor Standing balance comment: Reliant on UE support     Special needs/care consideration Skin sternal incision, generalized bruising, Behavioral consideration anxiety and Designated visitor Jo and Peggy (sisters)    Previous Home Environment (from acute therapy documentation) Living Arrangements: Alone Available Help at Discharge: Family, Available 24 hours/day Type of Home: House Home Layout: Two level, Able to live on main level with bedroom/bathroom Home Access: Stairs to enter Entrance Stairs-Rails: Right Entrance Stairs-Number of Steps: 3 Bathroom Shower/Tub: Tub/shower unit Bathroom Toilet: Standard Home  Care Services: No   Discharge Living Setting Plans for Discharge Living Setting: Patient's home Type of Home at Discharge: House Discharge Home Layout: Able to live on main level with bedroom/bathroom Discharge Home Access: Stairs to enter Entrance   Stairs-Rails: Right Entrance Stairs-Number of Steps: 3 Discharge Bathroom Shower/Tub: Tub/shower unit Discharge Bathroom Toilet: Standard Discharge Bathroom Accessibility: Yes How Accessible: Accessible via walker Does the patient have any problems obtaining your medications?: No   Social/Family/Support Systems Anticipated Caregiver: sisters Jo Brown and Peggy Anticipated Caregiver's Contact Information: Jo [phone] Ability/Limitations of Caregiver: up to min assist Caregiver Availability: 24/7 Discharge Plan Discussed with Primary Caregiver: Yes Is Caregiver In Agreement with Plan?: Yes Does Caregiver/Family have Issues with Lodging/Transportation while Pt is in Rehab?: No   Goals Patient/Family Goal for Rehab: PT/OT mod I Expected length of stay: 4-6 days Pt/Family Agrees to Admission and willing to participate: Yes Program Orientation Provided & Reviewed with Pt/Caregiver Including Roles  & Responsibilities: Yes   Decrease burden of Care through IP rehab admission: n/a   Possible need for SNF placement upon discharge: Not anticipated.    Patient Condition: I have reviewed medical records from  Hospital, spoken with CM, and patient and family member. I met with patient at the bedside for inpatient rehabilitation assessment.  Patient will benefit from ongoing PT and OT, can actively participate in 3 hours of therapy a day 5 days of the week, and can make measurable gains during the admission.  Patient will also benefit from the coordinated team approach during an Inpatient Acute Rehabilitation admission.  The patient will receive intensive therapy as well as Rehabilitation physician, nursing, social worker, and care  management interventions.  Due to safety, skin/wound care, disease management, medication administration, pain management and patient education the patient requires 24 hour a day rehabilitation nursing.  The patient is currently min assist to min guard with mobility and basic ADLs.  Discharge setting and therapy post discharge at home with home health is anticipated.  Patient has agreed to participate in the Acute Inpatient Rehabilitation Program and will admit today.   Preadmission Screen Completed By:  Caitlin E Warren, PT, DPT 02/02/2020 11:00 AM ______________________________________________________________________   Discussed status with Dr. Alisan Dokes on 02/02/20  at 11:09 AM  and received approval for admission today.   Admission Coordinator:  Caitlin E Warren, PT, DPT time 11:09 AM /Date 02/02/20     Assessment/Plan: Diagnosis: Debility s/p mitral valve repair 1. Does the need for close, 24 hr/day Medical supervision in concert with the patient's rehab needs make it unreasonable for this patient to be served in a less intensive setting? Yes 2. Co-Morbidities requiring supervision/potential complications: Elevated INR, GERD, anemia, paroxysmal atrial fibrillation, anxiety, depression, chronic pain, severe mitral regurgitation 3. Due to bladder management, bowel management, safety, skin/wound care, disease management, medication administration, pain management and patient education, does the patient require 24 hr/day rehab nursing? Yes 4. Does the patient require coordinated care of a physician, rehab nurse, PT, OT to address physical and functional deficits in the context of the above medical diagnosis(es)? Yes Addressing deficits in the following areas: balance, endurance, locomotion, strength, transferring, bowel/bladder control, bathing, dressing, feeding, grooming, toileting, cognition, speech, language, swallowing and psychosocial support 5. Can the patient actively participate in an intensive  therapy program of at least 3 hrs of therapy 5 days a week? Yes 6. The potential for patient to make measurable gains while on inpatient rehab is excellent 7. Anticipated functional outcomes upon discharge from inpatient rehab: modified independent PT, modified independent OT, independent SLP 8. Estimated rehab length of stay to reach the above functional goals is: 5-7 days 9. Anticipated discharge destination: Home 10. Overall Rehab/Functional Prognosis: excellent     MD Signature: 

## 2020-02-02 NOTE — Discharge Instructions (Signed)
1. Please stop ASA once INR is 2.0 2. Please contact [facility] on [address] for coumadin follow up once you are discharged from CIR.[name] your appointment should be made 48 hours after discharge      Discharge Instructions:  1. You may shower, please wash incisions daily with soap and water and keep dry.  If you wish to cover wounds with dressing you may do so but please keep clean and change daily.  No tub baths or swimming until incisions have completely healed.  If your incisions become red or develop any drainage please call our office at [phone]  2. No Driving until cleared by Dr. Guy Sandifer office and you are no longer using narcotic pain medications  3. Monitor your weight daily.. Please use the same scale and weigh at same time... If you gain 5-10 lbs in 48 hours with associated lower extremity swelling, please contact our office at [phone]  4. Fever of 101.5 for at least 24 hours with no source, please contact our office at [phone]  5. Activity- up as tolerated, please walk at least 3 times per day.  Avoid strenuous activity, no lifting, pushing, or pulling with your arms over 8-10 lbs for a minimum of 6 weeks  6. If any questions or concerns arise, please do not hesitate to contact our office at [phone]  Information on my medicine - Coumadin   (Warfarin)  This medication education was reviewed with me or my healthcare representative as part of my discharge preparation.  The pharmacist that spoke with me during my hospital stay was:  Romilda Garret, [facility]  Why was Coumadin prescribed for you? Coumadin was prescribed for you because you have a blood clot or a medical condition that can cause an increased risk of forming blood clots. Blood clots can cause serious health problems by blocking the flow of blood to the heart, lung, or brain. Coumadin can prevent harmful blood clots from forming. As a reminder your indication for Coumadin is:   Blood Clot Prevention  After Heart Valve Surgery   What test will check on my response to Coumadin? While on Coumadin (warfarin) you will need to have an INR test regularly to ensure that your dose is keeping you in the desired range. The INR (international normalized ratio) number is calculated from the result of the laboratory test called prothrombin time (PT).  If an INR APPOINTMENT HAS NOT ALREADY BEEN MADE FOR YOU please schedule an appointment to have this lab work done by your health care provider within 7 days. Your INR goal is usually a number between:  2 to 3 or your provider may give you a more narrow range like 2-2.5.  Ask your health care provider during an office visit what your goal INR is.  What  do you need to  know  About  COUMADIN? Take Coumadin (warfarin) exactly as prescribed by your healthcare provider about the same time each day.  DO NOT stop taking without talking to the doctor who prescribed the medication.  Stopping without other blood clot prevention medication to take the place of Coumadin may increase your risk of developing a new clot or stroke.  Get refills before you run out.  What do you do if you miss a dose? If you miss a dose, take it as soon as you remember on the same day then continue your regularly scheduled regimen the next day.  Do not take two doses of Coumadin at the same time.  Important Safety Information A possible side effect of Coumadin (Warfarin) is an increased risk of bleeding. You should call your healthcare provider right away if you experience any of the following: ? Bleeding from an injury or your nose that does not stop. ? Unusual colored urine (red or dark brown) or unusual colored stools (red or black). ? Unusual bruising for unknown reasons. ? A serious fall or if you hit your head (even if there is no bleeding).  Some foods or medicines interact with Coumadin (warfarin) and might alter your response to warfarin. To help avoid this: ? Eat a balanced diet,  maintaining a consistent amount of Vitamin K. ? Notify your provider about major diet changes you plan to make. ? Avoid alcohol or limit your intake to 1 drink for women and 2 drinks for men per day. (1 drink is 5 oz. wine, 12 oz. beer, or 1.5 oz. liquor.)  Make sure that ANY health care provider who prescribes medication for you knows that you are taking Coumadin (warfarin).  Also make sure the healthcare provider who is monitoring your Coumadin knows when you have started a new medication including herbals and non-prescription products.  Coumadin (Warfarin)  Major Drug Interactions  Increased Warfarin Effect Decreased Warfarin Effect  Alcohol (large quantities) Antibiotics (esp. Septra/Bactrim, Flagyl, Cipro) Amiodarone (Cordarone) Aspirin (ASA) Cimetidine (Tagamet) Megestrol (Megace) NSAIDs (ibuprofen, naproxen, etc.) Piroxicam (Feldene) Propafenone (Rythmol SR) Propranolol (Inderal) Isoniazid (INH) Posaconazole (Noxafil) Barbiturates (Phenobarbital) Carbamazepine (Tegretol) Chlordiazepoxide (Librium) Cholestyramine (Questran) Griseofulvin Oral Contraceptives Rifampin Sucralfate (Carafate) Vitamin K   Coumadin (Warfarin) Major Herbal Interactions  Increased Warfarin Effect Decreased Warfarin Effect  Garlic Ginseng Ginkgo biloba Coenzyme Q10 Green tea St. John's wort    Coumadin (Warfarin) FOOD Interactions  Eat a consistent number of servings per week of foods HIGH in Vitamin K (1 serving =  cup)  Collards (cooked, or boiled & drained) Kale (cooked, or boiled & drained) Mustard greens (cooked, or boiled & drained) Parsley *serving size only =  cup Spinach (cooked, or boiled & drained) Swiss chard (cooked, or boiled & drained) Turnip greens (cooked, or boiled & drained)  Eat a consistent number of servings per week of foods MEDIUM-HIGH in Vitamin K (1 serving = 1 cup)  Asparagus (cooked, or boiled & drained) Broccoli (cooked, boiled & drained, or raw &  chopped) Brussel sprouts (cooked, or boiled & drained) *serving size only =  cup Lettuce, raw (green leaf, endive, romaine) Spinach, raw Turnip greens, raw & chopped   These websites have more information on Coumadin (warfarin):  FailFactory.se; VeganReport.com.au;

## 2020-02-02 NOTE — Plan of Care (Signed)
Continue to monitor

## 2020-02-02 NOTE — Progress Notes (Signed)
OT progress note  Patient reports using reacher a lot at home due to back hx, able to doff socks and min cues for safety/technique to don/doff underwear. Review set up of sock aid as patient states she hasn't used recently, required min A to slide over heels due to B foot edema. Decreased cognition + problem solving requiring cue to put sock on device before attempting to use. Patient mod A to stand from recliner with cues for using momentum, min A for safety to stand from commode over toilet and hand held assist x1 for ambulation in room. Patient and sister report she is going to CIR today to maximize safety and independence before returning home, agree with this recommendation.     02/02/20 1100  OT Visit Information  Last OT Received On 02/02/20  Assistance Needed +1  History of Present Illness Pt is a 74 y.o. female admitted 01/17/20 for minimally invasive MVR and maze procedure; required median sternotomy for repair of L ventricular free-wall laceration. Episode of afib with RVR and hypotension 7/21. S/p TEE, cardioversion 7/22. PMH includes severe scoliosis (s/p procedures), chronic pain, osteoporosis, arthritis, peripheral neuropathy, anxiety.  Precautions  Precautions Sternal;Fall  Precaution Booklet Issued Yes (comment)  Precaution Comments verbally reviewed/educated during session, watch HR and O2  Pain Assessment  Pain Assessment Faces  Faces Pain Scale 2  Pain Location back pain  Pain Descriptors / Indicators Discomfort;Aching  Pain Intervention(s) Monitored during session  Cognition  Arousal/Alertness Awake/alert  Behavior During Therapy WFL for tasks assessed/performed  Overall Cognitive Status Impaired/Different from baseline  Area of Impairment Memory;Safety/judgement;Problem solving  Memory Decreased short-term memory;Decreased recall of precautions  Safety/Judgement Decreased awareness of safety;Decreased awareness of deficits  Problem Solving Requires verbal cues   General Comments patient requires multiple cues not to push from chair reviewed using momentum to stand from chair + toilet, patient attempted to use sock aid before she placed sock on device  ADL  Overall ADL's  Needs assistance/impaired  Grooming Wash/dry hands;Min guard;Standing  Lower Body Dressing Minimal assistance;With adaptive equipment;Cueing for sequencing;Sitting/lateral leans  Lower Body Dressing Details (indicate cue type and reason) patient demo use of reacher to doff socks and don/doff underwear with min cues for technique, min A use of sock aid due to swollen feet+difficulty sliding sock aid around heels  Toilet Transfer Minimal assistance;Ambulation;Comfort height toilet;Grab bars;Cueing for safety;Cueing for sequencing  Toilet Transfer Details (indicate cue type and reason) min A for safety, increased ease standing from commode over toilet vs recliner  Toileting- Clothing Manipulation and Hygiene Min guard;Sit to/from stand  Toileting - Clothing Manipulation Details (indicate cue type and reason) for balance  Functional mobility during ADLs Minimal assistance;Cueing for safety (hand held assist x1)  Bed Mobility  General bed mobility comments OOB in recliner  Balance  Overall balance assessment Needs assistance  Sitting-balance support Feet supported  Sitting balance-Leahy Scale Fair  Standing balance support Single extremity supported;During functional activity  Standing balance-Leahy Scale Fair  Standing balance comment can wash hands at sink side with min G assist  Transfers  Overall transfer level Needs assistance  Equipment used 1 person hand held assist  Transfers Sit to/from Stand  Sit to Stand Min assist;Mod assist  General transfer comment mod A to boost up from recliner with cues for using momentum vs pushing on chair arms, min A for safety standing from commode over toilet  General Comments  General comments (skin integrity, edema, etc.) O2 remain stable on  room air note HR  up to 132 with transfer to toilet  General Exercises - Lower Extremity  Ankle Circles/Pumps AROM;10 reps  Other Exercises  Other Exercises educate patient perform ankle pumps throughout the day to assist with B edema in feet  OT - End of Session  Activity Tolerance Patient tolerated treatment well  Patient left in chair;with call bell/phone within reach;with family/visitor present  Nurse Communication Mobility status  OT Assessment/Plan  OT Plan Discharge plan needs to be updated  OT Visit Diagnosis Unsteadiness on feet (R26.81);Muscle weakness (generalized) (M62.81);Pain  Pain - part of body  (back)  OT Frequency (ACUTE ONLY) Min 2X/week  Follow Up Recommendations CIR  OT Equipment 3 in 1 bedside commode  AM-PAC OT "6 Clicks" Daily Activity Outcome Measure (Version 2)  Help from another person eating meals? 4  Help from another person taking care of personal grooming? 3  Help from another person toileting, which includes using toliet, bedpan, or urinal? 3  Help from another person bathing (including washing, rinsing, drying)? 2  Help from another person to put on and taking off regular upper body clothing? 3  Help from another person to put on and taking off regular lower body clothing? 3  6 Click Score 18  OT Goal Progression  Progress towards OT goals Progressing toward goals  Acute Rehab OT Goals  Patient Stated Goal to get back to her baseline   OT Goal Formulation With patient  Time For Goal Achievement 02/05/20  Potential to Achieve Goals Good  ADL Goals  Pt Will Perform Lower Body Dressing with supervision;sitting/lateral leans;sit to/from stand  Pt Will Transfer to Toilet with supervision;ambulating;bedside commode  Pt/caregiver will Perform Home Exercise Program Increased strength;Both right and left upper extremity  Additional ADL Goal #1 Pt will state sternal precautions with no verbal cues to assist in order to ensure safety for d/c home.  Additional  ADL Goal #2 Pt will increase to supervisionA for OOB ADL tasks with O2 sats >90%.  OT Time Calculation  OT Start Time (ACUTE ONLY) 1044  OT Stop Time (ACUTE ONLY) 1109  OT Time Calculation (min) 25 min  OT General Charges  $OT Visit 1 Visit  OT Treatments  $Self Care/Home Management  23-37 mins   Delbert Phenix OT OT office: [phone]

## 2020-02-02 NOTE — Progress Notes (Signed)
CARDIAC REHAB PHASE I   D/c education completed with pt and sister. Encourage continued IS use, walks, and sternal precautions. Reviewed site care and importance of monitoring incision daily. Reinforced restrictions and exercise guidelines. Will refer to [place] Rufina Falco, RN BSN 02/02/2020 12:03 PM
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
## 2020-02-02 NOTE — Progress Notes (Signed)
Patient to rehab per wheelchair accompanied by RN and NT. Patient is confused x3 alert to person ,  Place and situation. Oriented to unit set up. Fall precaution bundle explained and signed by patient.

## 2020-02-03 ENCOUNTER — Other Ambulatory Visit: Payer: Self-pay

## 2020-02-03 ENCOUNTER — Encounter (HOSPITAL_COMMUNITY): Payer: Medicare Other | Admitting: Nurse Practitioner

## 2020-02-03 ENCOUNTER — Inpatient Hospital Stay (HOSPITAL_COMMUNITY): Payer: Medicare Other | Admitting: Physical Therapy

## 2020-02-03 ENCOUNTER — Inpatient Hospital Stay (HOSPITAL_COMMUNITY): Payer: Medicare Other | Admitting: Occupational Therapy

## 2020-02-03 ENCOUNTER — Inpatient Hospital Stay (HOSPITAL_COMMUNITY): Payer: Medicare Other

## 2020-02-03 DIAGNOSIS — R5381 Other malaise: Principal | ICD-10-CM

## 2020-02-03 LAB — COMPREHENSIVE METABOLIC PANEL
ALT: 20 U/L (ref 0–44)
AST: 27 U/L (ref 15–41)
Albumin: 2.3 g/dL — ABNORMAL LOW (ref 3.5–5.0)
Alkaline Phosphatase: 72 U/L (ref 38–126)
Anion gap: 8 (ref 5–15)
BUN: 11 mg/dL (ref 8–23)
CO2: 35 mmol/L — ABNORMAL HIGH (ref 22–32)
Calcium: 8.1 mg/dL — ABNORMAL LOW (ref 8.9–10.3)
Chloride: 95 mmol/L — ABNORMAL LOW (ref 98–111)
Creatinine, Ser: 0.71 mg/dL (ref 0.44–1.00)
GFR calc Af Amer: >60 mL/min (ref >60)
GFR calc non Af Amer: >60 mL/min (ref >60)
Glucose, Bld: 97 mg/dL (ref 70–99)
Potassium: 4.6 mmol/L (ref 3.5–5.1)
Sodium: 138 mmol/L (ref 135–145)
Total Bilirubin: 1 mg/dL (ref 0.3–1.2)
Total Protein: 5.4 g/dL — ABNORMAL LOW (ref 6.5–8.1)

## 2020-02-03 LAB — CBC WITH DIFFERENTIAL/PLATELET
Abs Immature Granulocytes: 0.05 10*3/uL (ref 0.00–0.07)
Basophils Absolute: 0 10*3/uL (ref 0.0–0.1)
Basophils Relative: 0 %
Eosinophils Absolute: 0 10*3/uL (ref 0.0–0.5)
Eosinophils Relative: 1 %
HCT: 27.7 % — ABNORMAL LOW (ref 36.0–46.0)
Hemoglobin: 8.3 g/dL — ABNORMAL LOW (ref 12.0–15.0)
Immature Granulocytes: 1 %
Lymphocytes Relative: 6 %
Lymphs Abs: 0.4 10*3/uL — ABNORMAL LOW (ref 0.7–4.0)
MCH: 26.3 pg (ref 26.0–34.0)
MCHC: 30 g/dL (ref 30.0–36.0)
MCV: 87.9 fL (ref 80.0–100.0)
Monocytes Absolute: 0.6 10*3/uL (ref 0.1–1.0)
Monocytes Relative: 7 %
Neutro Abs: 6.9 10*3/uL (ref 1.7–7.7)
Neutrophils Relative %: 85 %
Platelets: 260 10*3/uL (ref 150–400)
RBC: 3.15 MIL/uL — ABNORMAL LOW (ref 3.87–5.11)
RDW: 20.7 % — ABNORMAL HIGH (ref 11.5–15.5)
WBC: 8 10*3/uL (ref 4.0–10.5)
nRBC: 0 % (ref 0.0–0.2)

## 2020-02-03 LAB — TROPONIN I (HIGH SENSITIVITY)
Troponin I (High Sensitivity): 95 ng/L — ABNORMAL HIGH (ref <18)
Troponin I (High Sensitivity): 79 ng/L — ABNORMAL HIGH (ref <18)

## 2020-02-03 LAB — MAGNESIUM: Magnesium: 1.9 mg/dL (ref 1.7–2.4)

## 2020-02-03 LAB — PROTIME-INR
INR: 1.4 — ABNORMAL HIGH (ref 0.8–1.2)
Prothrombin Time: 17.1 seconds — ABNORMAL HIGH (ref 11.4–15.2)

## 2020-02-03 MED ORDER — CLONAZEPAM 0.25 MG PO TBDP
0.2500 mg | ORAL_TABLET | Freq: Two times a day (BID) | ORAL | Status: DC | PRN
  Administered 2020-02-04 – 2020-02-08 (×4): 0.25 mg via ORAL
  Filled 2020-02-03 (×4): qty 1

## 2020-02-03 MED ORDER — ASPIRIN EC 81 MG PO TBEC
81.0000 mg | DELAYED_RELEASE_TABLET | Freq: Every day | ORAL | Status: DC
  Administered 2020-02-03 – 2020-02-08 (×6): 81 mg via ORAL
  Filled 2020-02-03 (×7): qty 1

## 2020-02-03 MED ORDER — CLONAZEPAM 0.5 MG PO TABS: 0.2500 mg | ORAL_TABLET | Freq: Two times a day (BID) | ORAL | Status: DC | PRN

## 2020-02-03 MED ORDER — WARFARIN SODIUM 2 MG PO TABS
2.0000 mg | ORAL_TABLET | Freq: Once | ORAL | Status: AC
  Administered 2020-02-03: 2 mg via ORAL
  Filled 2020-02-03: qty 1

## 2020-02-03 NOTE — Progress Notes (Signed)
Asked to evaluate today's EKGs for QT and amiodarone. Manually measured QT is 400-411ms w/QTc 450-474ms.  No adjustments to amiodarone at this time QT appears stable I note that she had T changes from the 1st EKG done this AM Spoke with P. Love, PA-C, pt has been hemodynamically stable with no new complaints and she had been in communication with attending cardiology team. She will double check with the attending cardiology team.  [name], PA-C

## 2020-02-03 NOTE — Plan of Care (Signed)
  Problem: Consults Goal: RH GENERAL PATIENT EDUCATION Description: See Patient Education module for education specifics. Outcome: Progressing Goal: Skin Care Protocol Initiated - if Braden Score 18 or less Description: If consults are not indicated, leave blank or document N/A Outcome: Progressing Goal: Nutrition Consult-if indicated Outcome: Progressing   Problem: RH BOWEL ELIMINATION Goal: RH STG MANAGE BOWEL WITH ASSISTANCE Description: STG Manage Bowel with moderate Assistance. Outcome: Progressing Goal: RH STG MANAGE BOWEL W/MEDICATION W/ASSISTANCE Description: STG Manage Bowel with Medication with  moderate Assistance. Outcome: Progressing   Problem: RH BLADDER ELIMINATION Goal: RH STG MANAGE BLADDER WITH ASSISTANCE Description: STG Manage Bladder With  moderate Assistance Outcome: Progressing Goal: RH STG MANAGE BLADDER WITH MEDICATION WITH ASSISTANCE Description: STG Manage Bladder With Medication With Assistance. Outcome: Progressing   Problem: RH SKIN INTEGRITY Goal: RH STG SKIN FREE OF INFECTION/BREAKDOWN Description: Skin free fronm skin infection entire stay on rehab Outcome: Progressing Goal: RH STG ABLE TO PERFORM INCISION/WOUND CARE W/ASSISTANCE Description: STG Able To Perform Incision/Wound Care With Assistance. Outcome: Progressing   Problem: RH SAFETY Goal: RH STG ADHERE TO SAFETY PRECAUTIONS W/ASSISTANCE/DEVICE Description: STG Adhere to Safety Precautions With mod  Assistance/Device. Outcome: Progressing   Problem: RH PAIN MANAGEMENT Goal: RH STG PAIN MANAGED AT OR BELOW PT'S PAIN GOAL Description: Pain less than 2 Outcome: Progressing   Problem: RH KNOWLEDGE DEFICIT GENERAL Goal: RH STG INCREASE KNOWLEDGE OF SELF CARE AFTER HOSPITALIZATION Description: Moderate assistance Outcome: Progressing

## 2020-02-03 NOTE — Progress Notes (Signed)
EKG has been reviewed by Dr. Harrell Gave and cardiology PA. Patient had a recent cath that was unremarkable. To order troponin to check for elevation--> if positive they will trend it out. They will look out for results of labs and follow up as indicated.

## 2020-02-03 NOTE — Progress Notes (Signed)
Patient in room anxious appearing with DOE but reports that she feels SOB. NO chest pain reported. She reports that SOB is similar to what has been going and no worse. Lung exam unchanged but noted to be in A fib with HR in low 100s. Her sister came in during exam and feels that rehab is going to be too much for her. Patient has had high levels of anxiety since transfers and sister feels that this environment is detrimental to her. Will have CM follow up. EKG ordered and cardiology paged.

## 2020-02-03 NOTE — Progress Notes (Addendum)
Discussed patient with Ellwood Handler, PA for CVTS --patient exteremly anxious and frail at at baseline. Cognitive issues are not new. Clarified INR goal (different from progress note v/s DC summary)--she relayed that standard INR goal for MVR is  2.5-3.5. She will check on the patient tomorrow and have Dr. Roxy Manns follow up on Monday.   Contacted EP regarding pharm recommendations for amiodarone dosing.

## 2020-02-03 NOTE — Progress Notes (Signed)
Patient information reviewed and entered into [facility] System by Becky Daevon Holdren, PPS coordinator. Information including medical coding, function ability, and quality indicators will be reviewed and updated through discharge.   

## 2020-02-03 NOTE — Evaluation (Signed)
Physical Therapy Assessment and Plan  Patient Details  Name: [name] MRN: [id number] Date of Birth: 18-Aug-1945  PT Diagnosis: Abnormal posture, Difficulty walking and Muscle weakness Rehab Potential: Good ELOS: 7-10 days   Today's Date: 02/03/2020 PT Individual Time: 0916-1000 PT Individual Time Calculation (min): 44 min   Missed time 16 min  Hospital Problem: Principal Problem:   Debility   Past Medical History:  Past Medical History:  Diagnosis Date  . Anxiety   . Arthritis    "maybe in my back" (03/31/2018)  . Benign paroxysmal positional vertigo 06/08/2013  . Complication of anesthesia   . Fracture of multiple ribs 2015   "don't know from what; dx'd when I in hospital for 1st back OR" (03/31/2018)  . GERD (gastroesophageal reflux disease)   . Hair loss 04/12/2012  . Herpes   . History of blood transfusion    "twice; related to back OR" (03/31/2018)  . History of kidney stones   . Interstitial cystitis 11/06/2011  . 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 Mitral regurgitation   . Osteopenia 02/18/2012  . Osteoporosis   . PAF (paroxysmal atrial fibrillation) ([place]) 2012  . Peripheral neuropathy 11/06/2011  . PMDD (premenstrual dysphoric disorder)   . PONV (postoperative nausea and vomiting)    nausea, vomiting, hives and dizziness   . 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/2020   Complete bilateral atrial lesion set using cryothermy and bipolar radiofrequency ablation with clipping of LA appendage via right mini-thoracotomy approach  . 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 Seasonal allergies   . Vaginal delivery    ONE NSVD  . 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 ANTERIOR CERVICAL DECOMP/DISCECTOMY FUSION  ~ 2003  . BACK SURGERY    . BREAST SURGERY     BREAST BIOPSY--RIGHT  BENIGN  . BUNIONECTOMY Bilateral   . CARDIOVERSION N/A 01/26/2020   Procedure: CARDIOVERSION;  Surgeon: OGeralynn Rile MD;  [facility]  Service: Cardiovascular;  Laterality: N/A;  . COSMETIC SURGERY  2016   "back of my neck; related to earlier fusion"  . CYSTOSCOPY W/ STONE MANIPULATION  "several times"  . DILATION AND CURETTAGE OF UTERUS    . FOREHEAD RECONSTRUCTION Right    "removed bone protruding out of my forehead"  . HARDWARE REMOVAL  2016   "related to neck OR"  . INCONTINENCE SURGERY    . MINIMALLY INVASIVE MAZE PROCEDURE N/A 01/17/2020   Procedure: MINIMALLY INVASIVE MAZE PROCEDURE;  Surgeon: ORexene Alberts MD;  [facility]  Service: Open Heart Surgery;  Laterality: N/A;  . MITRAL VALVE REPAIR Right 01/17/2020   Procedure: MINIMALLY INVASIVE MITRAL VALVE REPAIR (MVR) USING MEMO 4D 32MM;  Surgeon: ORexene Alberts MD;  [facility]  Service: Open Heart Surgery;  Laterality: Right;  . POSTERIOR CERVICAL FUSION/FORAMINOTOMY  ~ 2008; 2015  . RIGHT/LEFT HEART CATH AND CORONARY ANGIOGRAPHY N/A 12/02/2019   Procedure: RIGHT/LEFT HEART CATH AND CORONARY ANGIOGRAPHY;  Surgeon: MBurnell Blanks MD;  [facility];  Service: Cardiovascular;  Laterality: N/A;  . SHOULDER ARTHROSCOPY W/ ROTATOR CUFF REPAIR Right 2012  . SPINAL FUSION  06/2014 - 2018 X ?7   "scoliosis; my entire back"  . TEE WITHOUT CARDIOVERSION N/A 11/21/2019   Procedure: TRANSESOPHAGEAL ECHOCARDIOGRAM (TEE);  Surgeon: NJohnsie Cancel  Wallis Bamberg, MD;  Location: [facility];  Service: Cardiovascular;  Laterality: N/A;  . TEE WITHOUT CARDIOVERSION N/A 01/17/2020   Procedure: TRANSESOPHAGEAL ECHOCARDIOGRAM (TEE);  Surgeon: Rexene Alberts, MD;  Location: [facility];  Service: Open Heart Surgery;  Laterality: N/A;  . TEE WITHOUT CARDIOVERSION N/A 01/26/2020   Procedure: TRANSESOPHAGEAL ECHOCARDIOGRAM (TEE);  Surgeon: Geralynn Rile, MD;  Location: [facility];  Service: Cardiovascular;   Laterality: N/A;  . TUBAL LIGATION    . VAGINAL HYSTERECTOMY     TVH    Assessment & Plan Clinical Impression: [name] is a 74 year old female with history of HTN, BPPV, anxiety d/o, chronic pain, symptomatic PAF with SOB and recent work up showing MVP with large flail segment and severe regurgitation. She was admitted on 01/17/20 for median sternotomy, minimally invasive MVR and Maze procedure by Dr. Roxy Manns. Post op course complicated by  Volume overload requiring neo and Milrinone. ABLA with heme positive stools--H/H monitored and stable.  AFib with RVR/Flutter despite Tikosyn and Cardizem added for rate control. She has also had issues with pain control requiring IV fentanyl-->transitioned to fentanyl patch with oxycodone prn.   She has had hypotension therefore Cardizem d/c and [name] consulted for assistance. She underwent DCCV by Dr. Audie Box on 07/22. She went back in RVR on 07/23--Tikosyn discontinued and she was loaded with IV amiodarone and transitioned to  amiodarone 400 mg bid and metoprolol for rate control. EP has signed off with recommendations to follow up in Afib clinic for amio taper. She continues to have limitations due to pleuritic chest pain, anxiety, hypoxia requiring supplemental oxygen as well as debility. CIR recommended due to functional decline.   Patient currently requires min with mobility secondary to decreased cardiorespiratoy endurance, decreased coordination and decreased standing balance, decreased postural control and difficulty maintaining precautions.  Prior to hospitalization, patient was independent  with mobility and lived with Alone in a House home.  Home access is 3Stairs to enter.  Patient will benefit from skilled PT intervention to maximize safe functional mobility, minimize fall risk and decrease caregiver burden for planned discharge home with 24 hour supervision.  Anticipate patient will benefit from follow up [place] at discharge.  PT - End of  Session Activity Tolerance: Tolerates 10 - 20 min activity with multiple rests Endurance Deficit: Yes PT Assessment Rehab Potential (ACUTE/IP ONLY): Good PT Barriers to Discharge: Home environment access/layout PT Patient demonstrates impairments in the following area(s): Balance;Safety;Endurance;Motor PT Transfers Functional Problem(s): Bed Mobility;Bed to Chair;Car;Furniture PT Locomotion Functional Problem(s): Ambulation PT Plan PT Intensity: Minimum of 1-2 x/day ,45 to 90 minutes PT Frequency: 5 out of 7 days PT Duration Estimated Length of Stay: 7-10 days PT Treatment/Interventions: Ambulation/gait training;Community reintegration;Neuromuscular re-education;Stair training;UE/LE Strength taining/ROM;Balance/vestibular training;Discharge planning;Therapeutic Activities;UE/LE Coordination activities;Patient/family education;Therapeutic Exercise PT Transfers Anticipated Outcome(s): Mod I PT Locomotion Anticipated Outcome(s): Mod I w/ LRAD PT Recommendation Follow Up Recommendations: Home health PT Patient destination: Home Equipment Recommended: To be determined   PT Evaluation Precautions/Restrictions Precautions Precautions: Sternal;Fall Restrictions Other Position/Activity Restrictions: Sternal precautions General Chart Reviewed: Yes PT Amount of Missed Time (min): 16 Minutes PT Missed Treatment Reason: Other (Comment) (per PA, limit participation d/t need for cardiology this AM.) Family/Caregiver Present: Yes Vital SignsOxygen Therapy O2 Device: Nasal Cannula Pain   Home Living/Prior Functioning Home Living Available Help at Discharge: Family;Available 24 hours/day Type of Home: House Home Access: Stairs to enter [employer] of Steps: 3 Entrance Stairs-Rails: Right Home Layout: Two level;Able to live on  main level with bedroom/bathroom Alternate Level Stairs-Number of Steps: has stair glide. Bathroom Shower/Tub: Chiropodist: Standard   Lives With: Alone Prior Function Level of Independence: Independent with gait;Independent with transfers;Independent with basic ADLs  Able to Take Stairs?: No Vision/Perception     Cognition Overall Cognitive Status: Impaired/Different from baseline Orientation Level: Oriented to person;Oriented to place;Oriented to situation Attention: Focused;Sustained Focused Attention: Appears intact Sustained Attention: Impaired Memory: Appears intact Sensation Sensation Light Touch: Appears Intact Coordination Gross Motor Movements are Fluid and Coordinated: Yes Motor  Motor Motor: Within Functional Limits   Trunk/Postural Assessment     Balance Balance Balance Assessed: No Dynamic Sitting Balance Sitting balance - Comments: able to sit EOB with supervision Extremity Assessment      RLE Assessment RLE Assessment: Within Functional Limits General Strength Comments: at least 3+/5, although NT d/t recent surgery. LLE Assessment LLE Assessment: Within Functional Limits General Strength Comments: at least 3+/5, although NT d/t recent surgery.  Care Tool Care Tool Bed Mobility Roll left and right activity   Roll left and right assist level: Contact Guard/Touching assist    Sit to lying activity   Sit to lying assist level: Minimal Assistance - Patient > 75%    Lying to sitting edge of bed activity   Lying to sitting edge of bed assist level: Minimal Assistance - Patient > 75%     Care Tool Transfers Sit to stand transfer   Sit to stand assist level: Moderate Assistance - Patient 50 - 74%    Chair/bed transfer Chair/bed transfer activity did not occur: Safety/medical concerns       Psychologist, counselling transfer activity did not occur: Safety/medical concerns (room eval only per PA.)        Care Tool Locomotion Ambulation   Assist level: Minimal Assistance - Patient > 75% Assistive device: Walker-rolling Max distance: 10 (around bed only.)  Walk  10 feet activity   Assist level: Minimal Assistance - Patient > 75% Assistive device: Walker-rolling   Walk 50 feet with 2 turns activity Walk 50 feet with 2 turns activity did not occur: Safety/medical concerns (no out of room per PA)      Walk 150 feet activity Walk 150 feet activity did not occur: Safety/medical concerns (no out of room per PA.)      Walk 10 feet on uneven surfaces activity Walk 10 feet on uneven surfaces activity did not occur: Safety/medical concerns      Stairs Stair activity did not occur: Safety/medical concerns        Walk up/down 1 step activity Walk up/down 1 step or curb (drop down) activity did not occur: Safety/medical concerns     Walk up/down 4 steps activity did not occuR: Safety/medical concerns  Walk up/down 4 steps activity      Walk up/down 12 steps activity Walk up/down 12 steps activity did not occur: Safety/medical concerns      Pick up small objects from floor Pick up small object from the floor (from standing position) activity did not occur: Safety/medical concerns (sternal precautions.)      Wheelchair Will patient use wheelchair at discharge?: No Type of Wheelchair: Manual Wheelchair activity did not occur: Safety/medical concerns      Wheel 50 feet with 2 turns activity Wheelchair 50 feet with 2 turns activity did not occur: Safety/medical concerns    Wheel 150 feet activity Wheelchair 150 feet activity did not occur: Safety/medical concerns  Refer to Care Plan for Long Term Goals  SHORT TERM GOAL WEEK 1    Recommendations for other services: Neuropsych  Skilled Therapeutic Intervention Evaluation completed (see details above and below) with education on PT POC and goals and individual treatment initiated with focus on endurance, gait and transfers.  Pt presents sitting in recliner, and c/o fatigue.  Per discussion w/ PA, perform only room therapy until seen by cardiology.  Pt agreed to participate w/ therapy, to  include transfer out of chair amb to bed and return to supine position.  Pt did perform LE there ex to tolerance.  Bed alarm on and all needs in reach.  Family member in room throughout session.  Mobility Bed Mobility Bed Mobility: Rolling Right;Rolling Left;Sit to Sidelying Right Rolling Right: Minimal Assistance - Patient > 75% Rolling Left: Minimal Assistance - Patient > 75% Sit to Sidelying Right: Minimal Assistance - Patient > 75% Transfers Transfers: Sit to Stand Sit to Stand: Minimal Assistance - Patient > 75% Transfer (Assistive device): Rolling walker Locomotion  Gait Ambulation: Yes Gait Assistance: Minimal Assistance - Patient > 75% Gait Distance (Feet): 10 Feet Assistive device: Rolling walker Gait Assistance Details: Verbal cues for precautions/safety Gait Gait: Yes Gait Pattern: Step-through pattern Gait velocity: decr Wheelchair Mobility Wheelchair Mobility: No   Discharge Criteria: Patient will be discharged from PT if patient refuses treatment 3 consecutive times without medical reason, if treatment goals not met, if there is a change in medical status, if patient makes no progress towards goals or if patient is discharged from hospital.  The above assessment, treatment plan, treatment alternatives and goals were discussed and mutually agreed upon: by patient and by family  Ladoris Gene 02/03/2020, 1:05 PM

## 2020-02-03 NOTE — Progress Notes (Signed)
Physical Therapy Session Note  Patient Details  Name: [name] MRN: [id number] Date of Birth: [birth year]  Today's Date: 02/03/2020 PT Individual Time: 8159-4707 PT Individual Time Calculation (min): 90mn   Short Term Goals: Week 1:  PT Short Term Goal 1 (Week 1): = LTG due to LOS  Skilled Therapeutic Interventions/Progress Updates:   Pt received supine in bed and agreeable to PT at bed level. Pt noted to be on 2L/min O2, SpO2 99% throughout session. PT instructed pt in supine therex: SAQ, heel slide, hip abduction( level 2 tband) ankle PF(level 2 tband) isometric hip adduction, bridge. Each completed x 10-12 with cues for decreased speed, full ROM, and symmetry R and L. Pt reports mild SOB following therex but no change in vital signs. Pt repeated asking "whats wrong with me" and PT educated pt on heeling process following heart surgery and expected fatigue and mild SOB with exertion. Poor retention of information noted. Pt left supine in bed with call bell in reach and all needs. Met     Therapy Documentation Precautions:  Precautions Precautions: Sternal, Fall Restrictions Weight Bearing Restrictions: No Other Position/Activity Restrictions: Sternal precautions \Vital Signs: Therapy Vitals Pulse Rate: 81 Resp: 20 BP: (!) 127/63 Patient Position (if appropriate): Lying Oxygen Therapy SpO2: 98 % O2 Device: Nasal Cannula Pain:    denies  Therapy/Group: Individual Therapy  ALorie Phenix7/30/2021, 4:26 PM

## 2020-02-03 NOTE — Progress Notes (Signed)
EKG reviewed by cardiology and felt to be in sinus. She recommended giving lopressor time to work and repeat EKG in a hour. Went and relayed information to patient and sister who are calmer at this moment. Patient reports that her breathing is "better now". Sister now seems more open to therapy-wants to make sure that her sister will not be pushed while she is here.

## 2020-02-03 NOTE — Progress Notes (Signed)
Occupational Therapy Session Note  Patient Details  Name: [name] MRN: [id number] Date of Birth: [birth year]  Today's Date: 02/03/2020 OT Individual Time: 1345-1415 OT Individual Time Calculation (min): 30 min  and Today's Date: 02/03/2020 OT Missed Time: 15 Minutes Missed Time Reason: Patient fatigue   Short Term Goals: Week 1:     Skilled Therapeutic Interventions/Progress Updates:    Pt asleep in bed upon arrival with daughter present.  Pt easily aroused. Pt with difficulty keeping eyes open and frequently dozed off while engaged in conversation.  Discussed therapy schedules with daughter and assured pt's daughter that schedules would be modified as needed to accommodate pt's ability to actively participate. In therapy. Discussed role of OT. Pt remained in bed with all needs within reach.  Daughter stated that pt would have 24 hour assistance at discharge.  Therapy Documentation Precautions:  Precautions Precautions: Sternal, Fall Restrictions Weight Bearing Restrictions: No Other Position/Activity Restrictions: Sternal precautions General: General Chart Reviewed: Yes OT Amount of Missed Time: 15 Minutes  Vital Signs: Oxygen Therapy O2 Device: Nasal Cannula Pain:  Pt c/o chest soreness from surgery; repositioned   Therapy/Group: Individual Therapy  Leroy Libman 02/03/2020, 2:48 PM

## 2020-02-03 NOTE — Evaluation (Signed)
Occupational Therapy Assessment and Plan  Patient Details  Name: [name] MRN: [id number] Date of Birth: 20-Apr-1946  OT Diagnosis: acute pain and muscle weakness (generalized) Rehab Potential: Rehab Potential (ACUTE ONLY): Good ELOS: ~7-10 days   Today's Date: 02/03/2020 OT Individual Time: 1100-1200 OT Individual Time Calculation (min): 60 min     Hospital Problem: Principal Problem:   Debility   Past Medical History:  Past Medical History:  Diagnosis Date  . Anxiety   . Arthritis    "maybe in my back" (03/31/2018)  . Benign paroxysmal positional vertigo 06/08/2013  . Complication of anesthesia   . Fracture of multiple ribs 2015   "don't know from what; dx'd when I in hospital for 1st back OR" (03/31/2018)  . GERD (gastroesophageal reflux disease)   . Hair loss 04/12/2012  . Herpes   . History of blood transfusion    "twice; related to back OR" (03/31/2018)  . History of kidney stones   . Interstitial cystitis 11/06/2011  . 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 Mitral regurgitation   . Osteopenia 02/18/2012  . Osteoporosis   . PAF (paroxysmal atrial fibrillation) ([place]) 2012  . Peripheral neuropathy 11/06/2011  . PMDD (premenstrual dysphoric disorder)   . PONV (postoperative nausea and vomiting)    nausea, vomiting, hives and dizziness   . 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/2020   Complete bilateral atrial lesion set using cryothermy and bipolar radiofrequency ablation with clipping of LA appendage via right mini-thoracotomy approach  . 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   Complex valvuloplasty including artificial Gore-tex neochord placement x12 with 23m Sorin Memo 4D ring annuloplasty  . Seasonal allergies   . Vaginal delivery    ONE NSVD  . 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 ANTERIOR CERVICAL DECOMP/DISCECTOMY FUSION  ~ 2003  . BACK SURGERY    . BREAST SURGERY     BREAST  BIOPSY--RIGHT BENIGN  . BUNIONECTOMY Bilateral   . CARDIOVERSION N/A 01/26/2020   Procedure: CARDIOVERSION;  Surgeon: OGeralynn Rile MD;  [place] [facility]  Service: Cardiovascular;  Laterality: N/A;  . COSMETIC SURGERY  2016   "back of my neck; related to earlier fusion"  . CYSTOSCOPY W/ STONE MANIPULATION  "several times"  . DILATION AND CURETTAGE OF UTERUS    . FOREHEAD RECONSTRUCTION Right    "removed bone protruding out of my forehead"  . HARDWARE REMOVAL  2016   "related to neck OR"  . INCONTINENCE SURGERY    . MINIMALLY INVASIVE MAZE PROCEDURE N/A 01/17/2020   Procedure: MINIMALLY INVASIVE MAZE PROCEDURE;  Surgeon: ORexene Alberts MD;  [place] [facility]  Service: Open Heart Surgery;  Laterality: N/A;  . MITRAL VALVE REPAIR Right 01/17/2020   Procedure: MINIMALLY INVASIVE MITRAL VALVE REPAIR (MVR) USING MEMO 4D 32MM;  Surgeon: ORexene Alberts MD;  [place] [facility]  Service: Open Heart Surgery;  Laterality: Right;  . POSTERIOR CERVICAL FUSION/FORAMINOTOMY  ~ 2008; 2015  . RIGHT/LEFT HEART CATH AND CORONARY ANGIOGRAPHY N/A 12/02/2019   Procedure: RIGHT/LEFT HEART CATH AND CORONARY ANGIOGRAPHY;  Surgeon: MBurnell Blanks MD;  [place] [facility];  Service: Cardiovascular;  Laterality: N/A;  . SHOULDER ARTHROSCOPY W/ ROTATOR CUFF REPAIR Right 2012  . SPINAL FUSION  06/2014 - 2018 X ?7   "scoliosis; my entire back"  . TEE WITHOUT CARDIOVERSION N/A 11/21/2019   Procedure: TRANSESOPHAGEAL ECHOCARDIOGRAM (TEE);  Surgeon: NJohnsie Cancel  Wallis Bamberg, MD;  Location: [facility] ENDOSCOPY;  Service: Cardiovascular;  Laterality: N/A;  . TEE WITHOUT CARDIOVERSION N/A 01/17/2020   Procedure: TRANSESOPHAGEAL ECHOCARDIOGRAM (TEE);  Surgeon: Rexene Alberts, MD;  Location: [place];  Service: Open Heart Surgery;  Laterality: N/A;  . TEE WITHOUT CARDIOVERSION N/A 01/26/2020   Procedure: TRANSESOPHAGEAL ECHOCARDIOGRAM (TEE);  Surgeon: Geralynn Rile, MD;  Location: [place];  Service:  Cardiovascular;  Laterality: N/A;  . TUBAL LIGATION    . VAGINAL HYSTERECTOMY     TVH    Assessment & Plan Clinical Impression: Patient is a 74 y.o. year old female history of HTN, BPPV, anxiety d/o, chronic pain, symptomatic PAF with SOB and recent work up showing MVP with large flail segment and severe regurgitation. She was admitted on 01/17/20 for median sternotomy, minimally invasive MVR and Maze procedure by Dr. Roxy Manns. Post op course complicated by  Volume overload requiring neo and Milrinone. ABLA with heme positive stools--H/H monitored and stable.  AFib with RVR/Flutter despite Tikosyn and Cardizem added for rate control. She has also had issues with pain control requiring IV fentanyl-->transitioned to fentanyl patch with oxycodone prn.   She has had hypotension therefore Cardizem d/c and [name] consulted for assistance. She underwent DCCV by Dr. Audie Box on 07/22. She went back in RVR on 07/23--Tikosyn discontinued and she was loaded with IV amiodarone and transitioned to  amiodarone 400 mg bid and metoprolol for rate control. EP has signed off with recommendations to follow up in Afib clinic for amio taper. She continues to have limitations due to pleuritic chest pain, anxiety, hypoxia requiring supplemental oxygen as well as debility  Patient transferred to CIR on 02/02/2020 .    Patient currently requires min to mod A without AE with basic self-care skills and min A for basic mobility secondary to muscle weakness, decreased cardiorespiratoy endurance and decreased standing balance, decreased balance strategies and difficulty maintaining precautions.  Prior to hospitalization, patient could complete ADL with modified independent .  Patient will benefit from skilled intervention to decrease level of assist with basic self-care skills and increase independence with basic self-care skills prior to discharge home with care partner.  Anticipate patient will require intermittent supervision and  follow up home health.  OT - End of Session Activity Tolerance: Tolerates 30+ min activity with multiple rests Endurance Deficit: Yes OT Assessment Rehab Potential (ACUTE ONLY): Good OT Patient demonstrates impairments in the following area(s): Balance;Skin Integrity;Endurance;Edema;Safety OT Basic ADL's Functional Problem(s): Grooming;Bathing;Dressing;Toileting OT Transfers Functional Problem(s): Toilet;Tub/Shower OT Additional Impairment(s): None OT Plan OT Intensity: Minimum of 1-2 x/day, 45 to 90 minutes OT Frequency: 5 out of 7 days OT Duration/Estimated Length of Stay: ~7-10 days OT Treatment/Interventions: Balance/vestibular training;Discharge planning;Pain management;Self Care/advanced ADL retraining;Therapeutic Activities;UE/LE Coordination activities;Cognitive remediation/compensation;Disease mangement/prevention;Functional mobility training;Patient/family education;Skin care/wound managment;Therapeutic Exercise;Community reintegration;DME/adaptive equipment instruction;Neuromuscular re-education;Psychosocial support;UE/LE Strength taining/ROM OT Self Feeding Anticipated Outcome(s): n/a OT Basic Self-Care Anticipated Outcome(s): mod I OT Toileting Anticipated Outcome(s): mod I OT Bathroom Transfers Anticipated Outcome(s): mod I OT Recommendation Recommendations for Other Services: Speech consult Patient destination: Home Follow Up Recommendations: Home health OT Equipment Recommended: To be determined   OT Evaluation Precautions/Restrictions  Precautions Precautions: Sternal;Fall Restrictions Weight Bearing Restrictions: No Other Position/Activity Restrictions: Sternal precautions General Chart Reviewed: Yes OT Amount of Missed Time: 15 Minutes PT Missed Treatment Reason: Other (Comment) (per PA, limit participation d/t need for cardiology this AM.) Family/Caregiver Present: Yes (sister [name]) Vital Signs Oxygen Therapy O2 Device: Nasal Cannula Pain  no c/o pain  Home Living/Prior Functioning Home Living Family/patient expects to be discharged to:: Private residence Living Arrangements: Alone Available Help at Discharge: Family, Available 24 hours/day Type of Home: House Home Access: Stairs to enter [employer] of Steps: 3 Entrance Stairs-Rails: Right Home Layout: Two level, Able to live on main level with bedroom/bathroom Alternate Level Stairs-Number of Steps: has stair glide. Bathroom Shower/Tub: Public librarian, Multimedia programmer: Standard  Lives With: Alone Prior Function Level of Independence: Independent with gait, Independent with transfers, Independent with basic ADLs  Able to Take Stairs?: No Vision Baseline Vision/History: Wears glasses Wears Glasses: At all times Patient Visual Report: No change from baseline Perception   WFL   Praxis  WFL Cognition Overall Cognitive Status: Impaired/Different from baseline Arousal/Alertness: Awake/alert Orientation Level: Person;Place;Situation Person: Oriented Place: Oriented Situation: Oriented Year: 2021 Month: July Day of Week: Correct Memory: Appears intact Immediate Memory Recall:  (0/3) Memory Recall Sock: Not able to recall Memory Recall Blue: Not able to recall Memory Recall Bed: Not able to recall Attention: Focused;Sustained Focused Attention: Appears intact Sustained Attention: Appears intact Awareness: Appears intact Safety/Judgment: Impaired Sensation Sensation Light Touch: Appears Intact Proprioception: Appears Intact Coordination Gross Motor Movements are Fluid and Coordinated: No Fine Motor Movements are Fluid and Coordinated: Yes Motor  Motor Motor: Within Functional Limits Motor - Skilled Clinical Observations: generalized weakness  Trunk/Postural Assessment  Cervical Assessment Cervical Assessment:  (limited ROM due to previous sx) Thoracic Assessment Thoracic Assessment:  (limited rotation) Lumbar Assessment Lumbar  Assessment: Within Functional Limits Postural Control Postural Control: Deficits on evaluation Righting Reactions: delayed  Balance Balance Balance Assessed: Yes Static Sitting Balance Static Sitting - Level of Assistance: 6: Modified independent (Device/Increase time) Dynamic Sitting Balance Dynamic Sitting - Level of Assistance: 5: Stand by assistance Sitting balance - Comments: able to sit EOB with supervision Static Standing Balance Static Standing - Level of Assistance: 4: Min assist Extremity/Trunk Assessment RUE Assessment RUE Assessment: Within Functional Limits    Care Tool Care Tool Self Care Eating   Eating Assist Level: Set up assist    Oral Care    Oral Care Assist Level: Set up assist    Bathing   Body parts bathed by patient: Right arm;Left arm;Chest;Abdomen;Front perineal area;Buttocks;Right upper leg;Left upper leg;Face Body parts bathed by helper: Right lower leg;Left lower leg   Assist Level: Minimal Assistance - Patient > 75%    Upper Body Dressing(including orthotics)   What is the patient wearing?: Pull over shirt   Assist Level: Minimal Assistance - Patient > 75%    Lower Body Dressing (excluding footwear)   What is the patient wearing?: Pants;Underwear/pull up Assist for lower body dressing: Moderate Assistance - Patient 50 - 74%    Putting on/Taking off footwear   What is the patient wearing?: Non-skid slipper socks Assist for footwear: Maximal Assistance - Patient 25 - 49%       Care Tool Toileting Toileting activity   Assist for toileting: Contact Guard/Touching assist     Care Tool Bed Mobility Roll left and right activity   Roll left and right assist level: Contact Guard/Touching assist    Sit to lying activity   Sit to lying assist level: Minimal Assistance - Patient > 75%    Lying to sitting edge of bed activity   Lying to sitting edge of bed assist level: Minimal Assistance - Patient > 75%     Care Tool Transfers Sit to  stand transfer   Sit to stand assist level: Minimal  Assistance - Patient > 75%    Chair/bed transfer   Chair/bed transfer assist level: Minimal Assistance - Patient > 75%     Toilet transfer   Assist Level: Minimal Assistance - Patient > 75%     Care Tool Cognition Expression of Ideas and Wants Expression of Ideas and Wants: Some difficulty - exhibits some difficulty with expressing needs and ideas (e.g, some words or finishing thoughts) or speech is not clear   Understanding Verbal and Non-Verbal Content Understanding Verbal and Non-Verbal Content: Usually understands - understands most conversations, but misses some part/intent of message. Requires cues at times to understand   Memory/Recall Ability *first 3 days only Memory/Recall Ability *first 3 days only: Current season;Location of own room;That he or she is in a hospital/hospital unit    Refer to Care Plan for [place] 1 OT Short Term Goal 1 (Week 1): LTG=STG  Recommendations for other services: Neuropsych   Skilled Therapeutic Intervention 1:1 Ot eval initiated with OT purpose, role and goals discussed with pt and pt's sister, [name]. Discussed with PA about eval and able to precede safely. Focused on functional ambulation around the room with RW with min a with cues for sternal precautions and at a slow rate. Pt showered A for standing balance and washing feet. Pt usually uses AE for LB due to previous back sx. Pt performed dressed in a chair at the sink. Again usually uses reacher for LB dressing so A for threading pants and donning socks. Pt reports fatigue but grateful for shower. Pt ambulated to recliner (across room) with HHA with min A to further challenge balance and promote upright posture.   Left resting in the recliner.    ADL ADL Eating: Set up Grooming: Setup Upper Body Bathing: Setup Where Assessed-Upper Body Bathing: Shower Lower Body Bathing: Minimal assistance Where  Assessed-Lower Body Bathing: Shower Upper Body Dressing: Minimal assistance Where Assessed-Upper Body Dressing: Sitting at sink Lower Body Dressing: Moderate assistance Where Assessed-Lower Body Dressing: Sitting at sink;Standing at sink Toileting: Minimal assistance Toilet Transfer: Minimal assistance Toilet Transfer Method: Ambulating Toilet Transfer Equipment: Drop arm bedside commode;Grab bars Social research officer, government: Minimal assistance Social research officer, government Method: Heritage manager: Transfer tub bench Mobility  Bed Mobility Bed Mobility: Rolling Right;Rolling Left;Sit to Sidelying Right Rolling Right: Minimal Assistance - Patient > 75% Rolling Left: Minimal Assistance - Patient > 75% Sit to Sidelying Right: Minimal Assistance - Patient > 75% Transfers Sit to Stand: Minimal Assistance - Patient > 75%   Discharge Criteria: Patient will be discharged from OT if patient refuses treatment 3 consecutive times without medical reason, if treatment goals not met, if there is a change in medical status, if patient makes no progress towards goals or if patient is discharged from hospital.  The above assessment, treatment plan, treatment alternatives and goals were discussed and mutually agreed upon: by patient and by family  Nicoletta Ba 02/03/2020, 3:02 PM

## 2020-02-03 NOTE — Progress Notes (Signed)
[employer] for Warfarin Indication: Atrial Fibrillation, Mitral Valve Repair  Patient Measurements: Total Body Weight: 55.4 kg Height: 66 inches  Vital Signs: Temp: 98.5 F (36.9 C) (07/30 0438) Temp Source: Oral (07/30 0438) BP: 120/55 (07/30 0438) Pulse Rate: 82 (07/30 0438)  Labs: Recent Labs    02/01/20 0503 02/02/20 0252 02/03/20 0702  HGB 8.4*  --  8.3*  HCT 28.2*  --  27.7*  PLT 264  --  260  LABPROT 49.0* 17.8* 17.1*  INR 5.6* 1.5* 1.4*  CREATININE 0.70 0.77 0.71    Estimated Creatinine Clearance: 56.7 mL/min (by C-G formula based on SCr of 0.71 mg/dL).   Assessment: 74 yr old female admitted to CIR from acute care; S/P minimally invasive mitral valve repair, complete MAZE procedure and sternotomy on 01/17/20. She was started on warfarin for mitral valve repair, which was interrupted due to dark stools/FOBT positive. Warfarin was restarted at 2 mg daily, and INR trended up to 5.6 yesterday, for which pt received vitamin K. Follow up INR was 1.5 and she was resumed on warfarin 1 mg po daily (goal range 2.5-3.5, per Dr. Roxy Manns discharge summary). Pharmacy was consulted by Rehab Medicine to dose warfarin.  H/H 8.3/27.7/plt 260; INR 1.4 today. No bleeding issues noted. Patient is on amiodarone, which likely contributed to elevated INR. Pt is on heart-healthy diet. Due to vit K given, will give slightly higher dose today.   Goal of Therapy:  INR: 2.5-3.5 Monitor platelets by anticoagulation protocol: Yes   Plan:  Warfarin 2 mg PO X 1 today Monitor daily INR, CBC Monitor for signs/symptoms of bleeding  Rebbeca Paul, PharmD PGY1 Pharmacy Resident 02/03/2020 10:11 AM  Please check [email or web address] for unit-specific pharmacy phone numbers.

## 2020-02-03 NOTE — Progress Notes (Signed)
PHYSICAL MEDICINE & REHABILITATION PROGRESS NOTE   Subjective/Complaints:  Initially, pt very anxious and c/o SOB which she admits is like it's been since she was admitted for heart surgery.   Satting 100% on 2L and was satting 95% on RA per RN.   Said doesn't feel good, but again, admits no difference than last few days.   Per sister, used klonopin at home at tiny doses, rarely, and would like dose decreased.  Also wants it prn, not scheduled.   Per RN, desatted some with gait, but not at rest- PT didn't tell RN or myself to what level pt desatted- so not clear if  <90% or not- asked them to use P2 if necessary, but not if sats stayed >93%.  Also spoke to Cards- rate was low 100s- felt to be in sinus rhythm- f/u EKG was the same, per Cards.- so no consult was felt to be needed.   Pt did well in initial PT session.    ROS:  Pt denies SOB after seen the 2nd time- more relaxed, comfortable, abd pain, CP, N/V/C/D, and vision changes   Objective:   No results found. Recent Labs    02/01/20 0503 02/03/20 0702  WBC 9.0 8.0  HGB 8.4* 8.3*  HCT 28.2* 27.7*  PLT 264 260   Recent Labs    02/02/20 0252 02/03/20 0702  NA 139 138  K 5.0 4.6  CL 95* 95*  CO2 35* 35*  GLUCOSE 124* 97  BUN 13 11  CREATININE 0.77 0.71  CALCIUM 8.4* 8.1*    Intake/Output Summary (Last 24 hours) at 02/03/2020 1235 Last data filed at 02/03/2020 0823 Gross per 24 hour  Intake 240 ml  Output -  Net 240 ml     Physical Exam: Vital Signs Blood pressure (!) 120/55, pulse 82, temperature 98.5 F (36.9 C), temperature source Oral, resp. rate 17, height 5\' 6"  (1.676 m), weight 59.1 kg, SpO2 95 %.  General: pt sitting up in bedside chair, very anxious, initially seen without sister, then seen again- wa sin bed- with sister at bedside s/p klonopin- much calmer, NAD   HEENT: wearing O2 by [place]- sats 100% on 2L- 95% on RA Heart: mildly tachycardic- Regular rhythm with occ extra beat  heard Pulmonary: good air movement B/L- a few coarse breath sounds heard, but no rhonchi or wheezes heard Abdomen: Soft, NT, ND, (+)BS  Extremities: No clubbing, cyanosis, or edema. Pulses are 2+ Skin:  Sternal incision C/D/I- looks good Neurological:     Mental Status: She is alert and oriented to person, place, and time.     Comments: anxious- but much calmer and flat on 2nd visit- difficulty taking protein supplement- swallowing- a few coughs Some difficulty following manual muscle testing but appears to be 4/5 strength throughout    Assessment/Plan: 1. Functional deficits secondary to debility due to MAZE procedure which require 3+ hours per day of interdisciplinary therapy in a comprehensive inpatient rehab setting.  Physiatrist is providing close team supervision and 24 hour management of active medical problems listed below.  Physiatrist and rehab team continue to assess barriers to discharge/monitor patient progress toward functional and medical goals  Care Tool:  Bathing              Bathing assist       Upper Body Dressing/Undressing Upper body dressing        Upper body assist      Lower Body Dressing/Undressing Lower body dressing  Lower body assist       Toileting Toileting    Toileting assist       Transfers Chair/bed transfer  Transfers assist  Chair/bed transfer activity did not occur: Safety/medical concerns        Locomotion Ambulation   Ambulation assist      Assist level: Minimal Assistance - Patient > 75% Assistive device: Walker-rolling Max distance: 10 (around bed only.)   Walk 10 feet activity   Assist     Assist level: Minimal Assistance - Patient > 75% Assistive device: Walker-rolling   Walk 50 feet activity   Assist Walk 50 feet with 2 turns activity did not occur: Safety/medical concerns (no out of room per PA)         Walk 150 feet activity   Assist Walk 150 feet activity did not  occur: Safety/medical concerns (no out of room per PA.)         Walk 10 feet on uneven surface  activity   Assist Walk 10 feet on uneven surfaces activity did not occur: Safety/medical concerns         Wheelchair     Assist Will patient use wheelchair at discharge?: No Type of Wheelchair: Manual Wheelchair activity did not occur: Safety/medical concerns         Wheelchair 50 feet with 2 turns activity    Assist    Wheelchair 50 feet with 2 turns activity did not occur: Safety/medical concerns       Wheelchair 150 feet activity     Assist  Wheelchair 150 feet activity did not occur: Safety/medical concerns       Blood pressure (!) 120/55, pulse 82, temperature 98.5 F (36.9 C), temperature source Oral, resp. rate 17, height 5\' 6"  (1.676 m), weight 59.1 kg, SpO2 95 %.  Medical Problem List and Plan: 1.  Impaired mobility and ADLs secondary to debility s/p mitral valve repair and Maze procedure             -patient may shower but incision must be covered             -ELOS/Goals: 5-7 days modI 2.  PAF/MVR/Antithrombotics: -DVT/anticoagulation:  Pharmaceutical: Now on Coumadin--INR subtherapeutic 5.6-->1.5 after Vitamin K on 7/28. Pharmacy to assist with management--INR goal 2-2.5  7/30- INR 1.4- s/p Vit K- will work with pharmacy on dosing.              -antiplatelet therapy: N/A 3. Chronic back pain/Pain Management: Fentanyl 25 mcg/hr with Oxycodone prn. Flexeril prn for spasms. Will schedule gabapentin tid instead of prn.  4. Mood: Team to provide ego support.              -antipsychotic agents: N/a 5. Neuropsych: This patient is intermitently capable of making decisions on her own behalf. 6. Skin/Wound Care: Routine pressure relief measures. Encourage protein supplements.  7. Fluids/Electrolytes/Nutrition: Monitor I/O. Check lytes in am.  8. Fluid overload: On Lasix daily--add low dose potassium to prevent intermittent hypokalemia.  Monitor for signs  of overload, HH diet and check daily weights.    Filed Weights   02/02/20 1600 02/03/20 0500  Weight: 57.4 kg 59.1 kg   7/30- up 2.5 kg, however different bed/scale- will monitor- K+ 4.6- stable on meds; Mg 1.9- good level 9. ABLA: H/H trending down 10.6-->9.3--> 8.4-->9.3.  Monitor for signs of bleeding. Increase iron supplement to bid.  Will order stool guaiacs. Recheck CBC in am.   7/30- Hb stable at 8.3- no changes- con't  to monitor 10.  A fib with RVR: Monitor HR tid--continue Lopressor 50 mg bid with amiodarone 400 mg bid. Well controlled  7/30- rate ~ 100 but regular rhythm- per EKG- was checked 2x this AM Hypocalcemia:  Ionized calcium 0.96.  Improved. Will add oral supplement.  7/30- Ca 8.1- will con't Ca supplements 11. Anxiety disorder: Change Klonopin to PRN as it is oversedating her as per sister- she takes PRN at home  7/30- change klonopin to prn and 0.25 mg BID prn- needs some anxiety meds so will not d/c. 12. Dysphagia  7/30- will order SLP for swallowing due to difficulties taking protein supplement per RN and PT/OT this AM. Is ordered    LOS: 1 days A FACE TO FACE EVALUATION WAS PERFORMED  [name] 02/03/2020, 12:35 PM

## 2020-02-03 NOTE — Progress Notes (Signed)
Contacted [name] [facility] to review EKG regarding T wave changes. Looked at past few EKGs and note change in T-waves--cardiology will follow up. Patient without chest pain/pressure this am or with therapy so far.

## 2020-02-03 NOTE — Progress Notes (Signed)
Inpatient Rehabilitation  Patient information reviewed and entered into eRehab system by Dayja Loveridge M. Kaydi Kley, M.A., CCC/SLP, PPS Coordinator.  Information including medical coding, functional ability and quality indicators will be reviewed and updated through discharge.    

## 2020-02-03 NOTE — Progress Notes (Signed)
Troponin trending down. Patient had a cardiac catheterization in May 2021 which showed minimal disease. Unless she is symptomatic with chest pain or shortness of breath, no further workup is needed for the EKG change. Please call cardiology if further assistance is needed.

## 2020-02-04 ENCOUNTER — Inpatient Hospital Stay (HOSPITAL_COMMUNITY): Payer: Medicare Other

## 2020-02-04 ENCOUNTER — Inpatient Hospital Stay (HOSPITAL_COMMUNITY): Payer: Medicare Other | Admitting: Speech Pathology

## 2020-02-04 LAB — PROTIME-INR
INR: 1.4 — ABNORMAL HIGH (ref 0.8–1.2)
Prothrombin Time: 16.9 seconds — ABNORMAL HIGH (ref 11.4–15.2)

## 2020-02-04 LAB — OCCULT BLOOD X 1 CARD TO LAB, STOOL: Fecal Occult Bld: NEGATIVE

## 2020-02-04 MED ORDER — WARFARIN SODIUM 2 MG PO TABS
2.0000 mg | ORAL_TABLET | Freq: Once | ORAL | Status: AC
  Administered 2020-02-04: 2 mg via ORAL
  Filled 2020-02-04: qty 1

## 2020-02-04 NOTE — Progress Notes (Signed)
Occupational Therapy Session Note  Patient Details  Name: [name] MRN: [id number] Date of Birth: 06-07-1946  Today's Date: 02/04/2020 OT Individual Time: 0700-0758 OT Individual Time Calculation (min): 58 min    Short Term Goals: Week 1:  OT Short Term Goal 1 (Week 1): LTG=STG  Skilled Therapeutic Interventions/Progress Updates:    1:1. Pt received in bed agreeable to OT finishing breakfast. Pt declines bathing this date, but agreeable to dressing. Pt completes supine>sitting EOB with HOB elevated with S using bed rail and CGA SPT to w/c from EOB with RW. Pt completes grooming seated at sink with set up. Pt reporting already toileted this session. Pt changes clothing with AE provided as pt uses this at home (7 back surgeries prior AE training) with CGA only for standing balance and min cuing for AE technique. O2 checked after dressing and 90-93 on RA with pt requesting to reapply O2 for SOB (mild)  Pt combs hair and OT braids. Pt applies make up seated for energy conservation. Exited session with pt seated in bed exit alarm on and call light inr each  Therapy Documentation Precautions:  Precautions Precautions: Sternal, Fall Restrictions Weight Bearing Restrictions: No Other Position/Activity Restrictions: Sternal precautions General:   Vital Signs:   Pain:   ADL: ADL Eating: Set up Grooming: Setup Upper Body Bathing: Setup Where Assessed-Upper Body Bathing: Shower Lower Body Bathing: Minimal assistance Where Assessed-Lower Body Bathing: Shower Upper Body Dressing: Minimal assistance Where Assessed-Upper Body Dressing: Sitting at sink Lower Body Dressing: Moderate assistance Where Assessed-Lower Body Dressing: Sitting at sink, Standing at sink Toileting: Minimal assistance Toilet Transfer: Minimal assistance Toilet Transfer Method: Insurance claims handler Equipment: Drop arm bedside commode, Grab bars Social research officer, government: Minimal assistance Financial planner Method: Heritage manager: Nurse, learning disability    Praxis   Exercises:   Other Treatments:     Therapy/Group: Individual Therapy  Tonny Branch 02/04/2020, 7:11 AM

## 2020-02-04 NOTE — Progress Notes (Addendum)
[facility] for Warfarin Indication: Atrial Fibrillation, Mitral Valve Repair  Patient Measurements: Total Body Weight: 55.4 kg Height: 66 inches  Vital Signs: Temp: 98.8 F (37.1 C) (07/31 0303) BP: 118/60 (07/31 0303) Pulse Rate: 90 (07/31 0303)  Labs: Recent Labs    02/02/20 0252 02/03/20 0702 02/03/20 1544 02/03/20 1749 02/04/20 0508  HGB  --  8.3*  --   --   --   HCT  --  27.7*  --   --   --   PLT  --  260  --   --   --   LABPROT 17.8* 17.1*  --   --  16.9*  INR 1.5* 1.4*  --   --  1.4*  CREATININE 0.77 0.71  --   --   --   TROPONINIHS  --   --  95* 79*  --     Estimated Creatinine Clearance: 54.8 mL/min (by C-G formula based on SCr of 0.71 mg/dL).   Assessment: 74 yr old female admitted to CIR from acute care; S/P minimally invasive mitral valve repair, complete MAZE procedure and sternotomy on 01/17/20. She was started on warfarin for mitral valve repair on 7/14, which was interrupted due to dark stools/FOBT positive. Warfarin was restarted at 2 mg daily on 7/22, and INR trended up to 5.6 on 7/28, for which pt received vitamin K. Follow up INR was 1.5 and she was resumed on warfarin 1 mg po daily (goal range 2.5-3.5, per Dr. Roxy Manns discharge summary). Pharmacy was consulted by Rehab Medicine to dose warfarin.  7/31 AM Update: INR is subtherapeutic today at 1.4, no change from yesterday's INR. Most recent H/H is low but stable. Plt wnl. No s/sx bleeding noted.   Patient recently started amiodarone on 7/23, which potentially contributed to elevated INR although interaction typically not observed for weeks-months. Patient likely more sensitive to warfarin, will continue to conservatively dose to avoid supertherapeutic levels w/ recent surgery.  Goal of Therapy:  INR: 2.5-3.5 Monitor platelets by anticoagulation protocol: Yes   Plan:  Warfarin 2 mg PO X 1 today Monitor daily INR, CBC Monitor for signs/symptoms of bleeding  Fara Olden, PharmD PGY-1 Pharmacy Resident 02/04/2020 9:46 AM  Please check [email or web address] for unit-specific pharmacy phone numbers.

## 2020-02-04 NOTE — Progress Notes (Addendum)
Physical Therapy Session Note  Patient Details  Name: [name] MRN: [id number] Date of Birth: 1946/06/05  Today's Date: 02/04/2020 PT Individual Time: 0915-1000, 1300-1400 PT Individual Time Calculation (min): 45 min , 60 min  Short Term Goals: Week 1:  PT Short Term Goal 1 (Week 1): = LTG due to LOS  Skilled Therapeutic Interventions/Progress Updates:  tx 1:  Pt resting in bed; she stated that she had a bit of surgical chest pain, unrated. .   Therapeutic exercises performed with LEs to increase strength for functional mobility: in supine-20 x 1 each:  alternating ankle pumps, bil adductor squeezes; 2 x 10 R/L straight leg raises, bil bridging with adductor squeezes for core activation. Cues throughout for counting aloud to avoid Valsalva, slowing down, and eccentric control.  Supine with head raised> sitting with mod assist and max cues to observe sternal precautions.  Max cues for hand placement for sit> stand to RW, mod assist.  Gait training on level tile x 100' with multiple turns, CGA; x 20' in room with CGA and RW to recliner.  From raised bed with arms across chest, sit> stand x 5 with supervision.  From lowered bed (as per home ) sit> stand with min assist, cues for hand placement.  At end of session, pt resting in recliner with needs at hand and seat belt alarm set. Sister present.  tx 2:  Pt sitting up in recliner, finishing lunch.  She reported HA 4/10, premedicated.  Sit> stand with min/mod assist.  Pt benefits from cues to scoot to the edge of the seat, place hands on knees, rock and count to 3 aloud as she stands.  Gait training as above, x 110' over level tile, RW.  Gait up/down (8) 4" high steps, bil rails, CGA.   Simulated car transfer with RW, min/mod assist , min cues to observe sternal precautions.  Pt remembered hand placement spontaneously for sit> stand 1/5 trials.  W/c> bed with max cues, mod assist to stand.  Sit> supine with extra time, supervision  with HOB raised slightly.    At end of session, pt resting in bed with needs at hand and bed alarm set.     Therapy Documentation Precautions:  Precautions Precautions: Sternal, Fall Restrictions Weight Bearing Restrictions: No Other Position/Activity Restrictions: Sternal precautions          Therapy/Group: Individual Therapy  Loraina Stauffer 02/04/2020, 10:12 AM

## 2020-02-04 NOTE — Evaluation (Signed)
Speech Language Pathology Assessment and Plan  Patient Details  Name: [name] MRN: [id number] Date of Birth: [birth year]  SLP Diagnosis: Dysphagia  Rehab Potential: Good ELOS: 1-2 additional visits    Today's Date: 02/04/2020 SLP Individual Time: 7106-2694 SLP Individual Time Calculation (min): 25 min   Hospital Problem: Principal Problem:   Debility  Past Medical History:  Past Medical History:  Diagnosis Date  . Anxiety   . Arthritis    "maybe in my back" (03/31/2018)  . Benign paroxysmal positional vertigo 06/08/2013  . Complication of anesthesia   . Fracture of multiple ribs 2015   "don't know from what; dx'd when I in hospital for 1st back OR" (03/31/2018)  . GERD (gastroesophageal reflux disease)   . Hair loss 04/12/2012  . Herpes   . History of blood transfusion    "twice; related to back OR" (03/31/2018)  . History of kidney stones   . Interstitial cystitis 11/06/2011  . 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 Mitral regurgitation   . Osteopenia 02/18/2012  . Osteoporosis   . PAF (paroxysmal atrial fibrillation) ([place]) 2012  . Peripheral neuropathy 11/06/2011  . PMDD (premenstrual dysphoric disorder)   . PONV (postoperative nausea and vomiting)    nausea, vomiting, hives and dizziness   . 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/2020   Complete bilateral atrial lesion set using cryothermy and bipolar radiofrequency ablation with clipping of LA appendage via right mini-thoracotomy approach  . 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   Complex valvuloplasty including artificial Gore-tex neochord placement x12 with 48m Sorin Memo 4D ring annuloplasty  . Seasonal allergies   . Vaginal delivery    ONE NSVD  . 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 ANTERIOR CERVICAL DECOMP/DISCECTOMY FUSION  ~ 2003  . BACK SURGERY    . BREAST SURGERY     BREAST BIOPSY--RIGHT BENIGN  . BUNIONECTOMY Bilateral   .  CARDIOVERSION N/A 01/26/2020   Procedure: CARDIOVERSION;  Surgeon: OGeralynn Rile MD;  [facility]  Service: Cardiovascular;  Laterality: N/A;  . COSMETIC SURGERY  2016   "back of my neck; related to earlier fusion"  . CYSTOSCOPY W/ STONE MANIPULATION  "several times"  . DILATION AND CURETTAGE OF UTERUS    . FOREHEAD RECONSTRUCTION Right    "removed bone protruding out of my forehead"  . HARDWARE REMOVAL  2016   "related to neck OR"  . INCONTINENCE SURGERY    . MINIMALLY INVASIVE MAZE PROCEDURE N/A 01/17/2020   Procedure: MINIMALLY INVASIVE MAZE PROCEDURE;  Surgeon: ORexene Alberts MD;  [facility]  Service: Open Heart Surgery;  Laterality: N/A;  . MITRAL VALVE REPAIR Right 01/17/2020   Procedure: MINIMALLY INVASIVE MITRAL VALVE REPAIR (MVR) USING MEMO 4D 32MM;  Surgeon: ORexene Alberts MD;  [facility]  Service: Open Heart Surgery;  Laterality: Right;  . POSTERIOR CERVICAL FUSION/FORAMINOTOMY  ~ 2008; 2015  . RIGHT/LEFT HEART CATH AND CORONARY ANGIOGRAPHY N/A 12/02/2019   Procedure: RIGHT/LEFT HEART CATH AND CORONARY ANGIOGRAPHY;  Surgeon: MBurnell Blanks MD;  [facility];  Service: Cardiovascular;  Laterality: N/A;  . SHOULDER ARTHROSCOPY W/ ROTATOR CUFF REPAIR Right 2012  . SPINAL FUSION  06/2014 - 2018 X ?7   "scoliosis; my entire back"  . TEE WITHOUT CARDIOVERSION N/A 11/21/2019   Procedure: TRANSESOPHAGEAL ECHOCARDIOGRAM (TEE);  Surgeon: NJosue Hector MD;  [facility];  Service: Cardiovascular;  Laterality: N/A;  . TEE WITHOUT CARDIOVERSION N/A 01/17/2020   Procedure: TRANSESOPHAGEAL ECHOCARDIOGRAM (TEE);  Surgeon: Rexene Alberts, MD;  Location: [facility];  Service: Open Heart Surgery;  Laterality: N/A;  . TEE WITHOUT CARDIOVERSION N/A 01/26/2020   Procedure: TRANSESOPHAGEAL ECHOCARDIOGRAM (TEE);  Surgeon: Geralynn Rile, MD;  Location: [facility];  Service: Cardiovascular;  Laterality: N/A;  . TUBAL LIGATION     . VAGINAL HYSTERECTOMY     TVH    Assessment / Plan / Recommendation Clinical Impression   [name] is a 74 year old female with history of HTN, BPPV, anxiety d/o, chronic pain, symptomatic PAF with SOB and recent work up showing MVP with large flail segment and severe regurgitation. She was admitted on 01/17/20 for median sternotomy, minimally invasive MVR and Maze procedure by Dr. Roxy Manns. Post op course complicated by  Volume overload requiring neo and Milrinone. ABLA with heme positive stools--H/H monitored and stable.  AFib with RVR/Flutter despite Tikosyn and Cardizem added for rate control. She has also had issues with pain control requiring IV fentanyl-->transitioned to fentanyl patch with oxycodone prn.   She has had hypotension therefore Cardizem d/c and [name] consulted for assistance. She underwent DCCV by Dr. Audie Box on 07/22. She went back in RVR on 07/23--Tikosyn discontinued and she was loaded with IV amiodarone and transitioned to  amiodarone 400 mg bid and metoprolol for rate control. EP has signed off with recommendations to follow up in Afib clinic for amio taper. She continues to have limitations due to pleuritic chest pain, anxiety, hypoxia requiring supplemental oxygen as well as debility. CIR recommended due to functional decline.  SLP evaluation was completed on 02/04/20 due to reports from staff members of pt having difficulty swallowing.  Pt presents with no overt difficulty with presentations of solids, purees, and liquid textures. Pt's oral phase was efficient (although pt reports she is a typically a slow eater) for containing and clearing boluses from the oral cavity.  No overt s/s of aspiration were evident across any consistencies.  No complaints of globus sensation or other esophageal symptoms although pt endorses history of reflux which she feels is well managed with OTC Pepcid and Tums as needed.  Given reports of more significant difficulty during meals likely in  the setting of debility, I feel that 1-2 additional sessions for monitoring and education on swallowing precautions during more dynamic contexts such as meals is warranted.    Skilled Therapeutic Interventions          Bedside swallow evaluation completed with results and recommendations reviewed with family.     SLP Assessment  Patient will need skilled Speech Lanaguage Pathology Services during CIR admission    Recommendations  SLP Diet Recommendations: Age appropriate regular solids;Thin Liquid Administration via: Cup;Straw Medication Administration: Whole meds with puree Supervision: Patient able to self feed Compensations: Slow rate;Small sips/bites Postural Changes and/or Swallow Maneuvers: Out of bed for meals;Seated upright 90 degrees;Upright 30-60 min after meal Oral Care Recommendations: Oral care BID Patient destination: Home Follow up Recommendations: None Equipment Recommended: None recommended by SLP    SLP Frequency 1 to 3 out of 7 days   SLP Duration  SLP Intensity  SLP Treatment/Interventions 1-2 additional visits  Minumum of 1-2 x/day, 30 to 90 minutes  Dysphagia/aspiration precaution training;Cueing hierarchy;Patient/family education    Pain Pain Assessment Pain Scale: 0-10 Pain Score: 0-No pain  Prior Functioning Cognitive/Linguistic Baseline: Within functional limits Type of Home: House  Lives With:  Alone Available Help at Discharge: Family;Available 24 hours/day  SLP Evaluation Cognition Overall Cognitive Status: Within Functional Limits for tasks assessed  Comprehension   Expression   Oral Motor    Care Tool Care Tool Cognition Expression of Ideas and Wants Expression of Ideas and Wants: Without difficulty (complex and basic) - expresses complex messages without difficulty and with speech that is clear and easy to understand   Understanding Verbal and Non-Verbal Content Understanding Verbal and Non-Verbal Content: Understands (complex and  basic) - clear comprehension without cues or repetitions   Memory/Recall Ability *first 3 days only Memory/Recall Ability *first 3 days only: Current season;Location of own room;That he or she is in a hospital/hospital unit     PMSV Assessment  PMSV Trial    Bedside Swallowing Assessment General Previous Swallow Assessment: none on record Diet Prior to this Study: Regular;Thin liquids Temperature Spikes Noted: No Respiratory Status: Room air Behavior/Cognition: Alert;Cooperative Self-Feeding Abilities: Able to feed self Patient Positioning: Upright in chair/Tumbleform Baseline Vocal Quality: Normal Volitional Cough: Strong (guarded due to rib pain) Volitional Swallow: Able to elicit  Oral Care Assessment   Ice Chips   Thin Liquid Thin Liquid: Within functional limits Nectar Thick   Honey Thick   Puree Puree: Within functional limits Solid Solid: Within functional limits BSE Assessment Risk for Aspiration Impact on safety and function: Mild aspiration risk Other Related Risk Factors: History of GERD;Deconditioning  Short Term Goals: Week 1: SLP Short Term Goal 1 (Week 1): STG=LTG due to anticipated short length of stay for SLP  Refer to Care Plan for Long Term Goals  Recommendations for other services: None   Discharge Criteria: Patient will be discharged from SLP if patient refuses treatment 3 consecutive times without medical reason, if treatment goals not met, if there is a change in medical status, if patient makes no progress towards goals or if patient is discharged from hospital.  The above assessment, treatment plan, treatment alternatives and goals were discussed and mutually agreed upon: by patient  Emilio Math 02/04/2020, 12:32 PM

## 2020-02-04 NOTE — Progress Notes (Signed)
[facility] PHYSICAL MEDICINE & REHABILITATION PROGRESS NOTE   Subjective/Complaints:   Pt reports feeling better overall this AM- does note, still feels a little SOB, however.  Overall, feels good, walking by herself, didn't call nurse, from bathroom back to bed- sister in room.   Off O2- troponin trending down, per Cards, and no need to pursue further.   Ate "great".   ROS:  Pt denies SOB, abd pain, CP, N/V/C/D, and vision changes  Objective:   No results found. Recent Labs    02/03/20 0702  WBC 8.0  HGB 8.3*  HCT 27.7*  PLT 260   Recent Labs    02/02/20 0252 02/03/20 0702  NA 139 138  K 5.0 4.6  CL 95* 95*  CO2 35* 35*  GLUCOSE 124* 97  BUN 13 11  CREATININE 0.77 0.71  CALCIUM 8.4* 8.1*    Intake/Output Summary (Last 24 hours) at 02/04/2020 1510 Last data filed at 02/04/2020 0905 Gross per 24 hour  Intake 240 ml  Output --  Net 240 ml     Physical Exam: Vital Signs Blood pressure (!) 118/60, pulse 90, temperature 98.8 F (37.1 C), resp. rate 18, height 5\' 6"  (1.676 m), weight 57.1 kg, SpO2 93 %.  General: pt walking with RW from bathroom to bed; bright affect, no O2, sister in room, NAD HEENT: Off O2 Heart: RRR- no Afib heard Pulmonary: CTA B/L- no W/R/R- good air movement Abdomen: Soft, NT, ND, (+)BS   Extremities: No clubbing, cyanosis, or edema. Pulses are 2+ Skin:  Sternal incision C/D/I- looks good- no drainage Neurological:     Mental Status: She is alert, MUCH less anxious  Some difficulty following manual muscle testing but appears to be 4/5 strength throughout    Assessment/Plan: 1. Functional deficits secondary to debility due to MAZE procedure which require 3+ hours per day of interdisciplinary therapy in a comprehensive inpatient rehab setting.  Physiatrist is providing close team supervision and 24 hour management of active medical problems listed below.  Physiatrist and rehab team continue to assess barriers to discharge/monitor  patient progress toward functional and medical goals  Care Tool:  Bathing    Body parts bathed by patient: Right arm, Left arm, Chest, Abdomen, Front perineal area, Buttocks, Right upper leg, Left upper leg, Face   Body parts bathed by helper: Right lower leg, Left lower leg     Bathing assist Assist Level: Minimal Assistance - Patient > 75%     Upper Body Dressing/Undressing Upper body dressing   What is the patient wearing?: Hospital gown only    Upper body assist Assist Level: Minimal Assistance - Patient > 75%    Lower Body Dressing/Undressing Lower body dressing      What is the patient wearing?: Underwear/pull up, Skirt     Lower body assist Assist for lower body dressing: Moderate Assistance - Patient 50 - 74%     Toileting Toileting    Toileting assist Assist for toileting: Contact Guard/Touching assist     Transfers Chair/bed transfer  Transfers assist  Chair/bed transfer activity did not occur: Safety/medical concerns  Chair/bed transfer assist level: Minimal Assistance - Patient > 75%     Locomotion Ambulation   Ambulation assist      Assist level: Contact Guard/Touching assist Assistive device: Walker-rolling Max distance: 100   Walk 10 feet activity   Assist     Assist level: Minimal Assistance - Patient > 75% Assistive device: Walker-rolling   Walk 50 feet activity  Assist Walk 50 feet with 2 turns activity did not occur: Safety/medical concerns (no out of room per PA)  Assist level: Contact Guard/Touching assist      Walk 150 feet activity   Assist Walk 150 feet activity did not occur: Safety/medical concerns (no out of room per PA.)         Walk 10 feet on uneven surface  activity   Assist Walk 10 feet on uneven surfaces activity did not occur: Safety/medical concerns         Wheelchair     Assist Will patient use wheelchair at discharge?: No Type of Wheelchair: Manual Wheelchair activity did not  occur: Safety/medical concerns         Wheelchair 50 feet with 2 turns activity    Assist    Wheelchair 50 feet with 2 turns activity did not occur: Safety/medical concerns       Wheelchair 150 feet activity     Assist  Wheelchair 150 feet activity did not occur: Safety/medical concerns       Blood pressure (!) 118/60, pulse 90, temperature 98.8 F (37.1 C), resp. rate 18, height 5\' 6"  (1.676 m), weight 57.1 kg, SpO2 93 %.  Medical Problem List and Plan: 1.  Impaired mobility and ADLs secondary to debility s/p mitral valve repair and Maze procedure             -patient may shower but incision must be covered             -ELOS/Goals: 5-7 days modI 2.  PAF/MVR/Antithrombotics: -DVT/anticoagulation:  Pharmaceutical: Now on Coumadin--INR subtherapeutic 5.6-->1.5 after Vitamin K on 7/28. Pharmacy to assist with management--INR goal 2-2.5  7/30- INR 1.4- s/p Vit K- will work with pharmacy on dosing.   7/31- per pharmacy- Vit K resistance with coumadin- 2 mg again today             -antiplatelet therapy: N/A 3. Chronic back pain/Pain Management: Fentanyl 25 mcg/hr with Oxycodone prn. Flexeril prn for spasms. Will schedule gabapentin tid instead of prn.  4. Mood: Team to provide ego support.              -antipsychotic agents: N/a 5. Neuropsych: This patient is intermitently capable of making decisions on her own behalf. 6. Skin/Wound Care: Routine pressure relief measures. Encourage protein supplements.  7. Fluids/Electrolytes/Nutrition: Monitor I/O. Check lytes in am.  8. Fluid overload: On Lasix daily--add low dose potassium to prevent intermittent hypokalemia.  Monitor for signs of overload, HH diet and check daily weights.    Filed Weights   02/02/20 1600 02/03/20 0500 02/04/20 0500  Weight: 57.4 kg 59.1 kg 57.1 kg   7/30- up 2.5 kg, however different bed/scale- will monitor- K+ 4.6- stable on meds; Mg 1.9- good level  7/31 - weight down to 57.1 kg 9. ABLA: H/H  trending down 10.6-->9.3--> 8.4-->9.3.  Monitor for signs of bleeding. Increase iron supplement to bid.  Will order stool guaiacs. Recheck CBC in am.   7/30- Hb stable at 8.3- no changes- con't to monitor 10.  A fib with RVR: Monitor HR tid--continue Lopressor 50 mg bid with amiodarone 400 mg bid. Well controlled  7/30- rate ~ 100 but regular rhythm- per EKG- was checked 2x this AM  7/31- Troponins trending down- Cards doesn't see  A need to see her in f/u unless new Sx's occur.  Hypocalcemia:  Ionized calcium 0.96.  Improved. Will add oral supplement.  7/30- Ca 8.1- will con't Ca supplements 11. Anxiety  disorder: Change Klonopin to PRN as it is oversedating her as per sister- she takes PRN at home  7/30- change klonopin to prn and 0.25 mg BID prn- needs some anxiety meds so will not d/c. 12. Dysphagia  7/30- will order SLP for swallowing due to difficulties taking protein supplement per RN and PT/OT this AM. Is ordered   7/31- 1-2 more SLP sessions just to verify pt doing ok- notes is slow eater  LOS: 2 days A FACE TO FACE EVALUATION WAS PERFORMED  [name] 02/04/2020, 3:10 PM

## 2020-02-05 ENCOUNTER — Encounter: Payer: Self-pay | Admitting: Physician Assistant

## 2020-02-05 LAB — PROTIME-INR
INR: 1.6 — ABNORMAL HIGH (ref 0.8–1.2)
Prothrombin Time: 18.8 seconds — ABNORMAL HIGH (ref 11.4–15.2)

## 2020-02-05 MED ORDER — WARFARIN SODIUM 1 MG PO TABS
1.0000 mg | ORAL_TABLET | Freq: Once | ORAL | Status: AC
  Administered 2020-02-05: 1 mg via ORAL
  Filled 2020-02-05: qty 1

## 2020-02-05 NOTE — Progress Notes (Signed)
PHYSICAL MEDICINE & REHABILITATION PROGRESS NOTE   Subjective/Complaints:   Pt reports she is feeling good- wondering what I thought- thinks she's working well with therapy- still having soreness in chest incision, but no SOB complained about today- no O2 currently- said last night was not a good night- didn't explain why.  LBM last night- sister at bedside- no concerns.   Asking when can go home.   ROS:  Pt denies SOB, abd pain, CP, N/V/C/D, and vision changes  Objective:   No results found. Recent Labs    02/03/20 0702  WBC 8.0  HGB 8.3*  HCT 27.7*  PLT 260   Recent Labs    02/03/20 0702  NA 138  K 4.6  CL 95*  CO2 35*  GLUCOSE 97  BUN 11  CREATININE 0.71  CALCIUM 8.1*    Intake/Output Summary (Last 24 hours) at 02/05/2020 1500 Last data filed at 02/04/2020 1845 Gross per 24 hour  Intake 120 ml  Output -  Net 120 ml     Physical Exam: Vital Signs Blood pressure (!) 101/60, pulse 72, temperature 98.2 F (36.8 C), temperature source Oral, resp. rate 21, height 5\' 6"  (1.676 m), weight 59.1 kg, SpO2 100 %.  General: pt sitting EOB in room, more appropriate, other sister in room, NAD HEENT: Off O2- sitting on bed, not wearing Heart: RRR- no afib heard today Pulmonary: CTA B/L- no W/R/R- good air movement Abdomen: Soft, NT, ND, (+)BS   Extremities: No clubbing, cyanosis, or edema. Pulses are 2+ Skin:  Sternal incision C/D/I- looks good- healing well Neurological:     Mental Status: She is alert, MUCH less anxious- better again today- needed a lot of reassurance doing well  Some difficulty following manual muscle testing but appears to be 4/5 strength throughout    Assessment/Plan: 1. Functional deficits secondary to debility due to MAZE procedure which require 3+ hours per day of interdisciplinary therapy in a comprehensive inpatient rehab setting.  Physiatrist is providing close team supervision and 24 hour management of active medical  problems listed below.  Physiatrist and rehab team continue to assess barriers to discharge/monitor patient progress toward functional and medical goals  Care Tool:  Bathing    Body parts bathed by patient: Right arm, Left arm, Chest, Abdomen, Front perineal area, Buttocks, Right upper leg, Left upper leg, Face   Body parts bathed by helper: Right lower leg, Left lower leg     Bathing assist Assist Level: Minimal Assistance - Patient > 75%     Upper Body Dressing/Undressing Upper body dressing   What is the patient wearing?: Pull over shirt    Upper body assist Assist Level: Minimal Assistance - Patient > 75%    Lower Body Dressing/Undressing Lower body dressing      What is the patient wearing?: Pants, Underwear/pull up     Lower body assist Assist for lower body dressing: Moderate Assistance - Patient 50 - 74%     Toileting Toileting    Toileting assist Assist for toileting: Minimal Assistance - Patient > 75%     Transfers Chair/bed transfer  Transfers assist  Chair/bed transfer activity did not occur: Safety/medical concerns  Chair/bed transfer assist level: Minimal Assistance - Patient > 75%     Locomotion Ambulation   Ambulation assist      Assist level: Contact Guard/Touching assist Assistive device: Walker-rolling Max distance: 100   Walk 10 feet activity   Assist     Assist level: Minimal Assistance -  Patient > 75% Assistive device: Walker-rolling   Walk 50 feet activity   Assist Walk 50 feet with 2 turns activity did not occur: Safety/medical concerns (no out of room per PA)  Assist level: Contact Guard/Touching assist      Walk 150 feet activity   Assist Walk 150 feet activity did not occur: Safety/medical concerns (no out of room per PA.)         Walk 10 feet on uneven surface  activity   Assist Walk 10 feet on uneven surfaces activity did not occur: Safety/medical concerns         Wheelchair     Assist  Will patient use wheelchair at discharge?: No Type of Wheelchair: Manual Wheelchair activity did not occur: Safety/medical concerns         Wheelchair 50 feet with 2 turns activity    Assist    Wheelchair 50 feet with 2 turns activity did not occur: Safety/medical concerns       Wheelchair 150 feet activity     Assist  Wheelchair 150 feet activity did not occur: Safety/medical concerns       Blood pressure (!) 101/60, pulse 72, temperature 98.2 F (36.8 C), temperature source Oral, resp. rate 21, height 5\' 6"  (1.676 m), weight 59.1 kg, SpO2 100 %.  Medical Problem List and Plan: 1.  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              -ELOS/Goals: 5-7 days modI 2.  PAF/MVR/Antithrombotics: -DVT/anticoagulation:  Pharmaceutical: Now on Coumadin--INR subtherapeutic 5.6-->1.5 after Vitamin K on 7/28. Pharmacy to assist with management--INR goal 2-2.5  7/30- INR 1.4- s/p Vit K- will work with pharmacy on dosing.   7/31- per pharmacy- Vit K resistance with coumadin- 2 mg again today  8/1- INR up to 1.6- con't per pharmacy             -antiplatelet therapy: N/A 3. Chronic back pain/Pain Management: Fentanyl 25 mcg/hr with Oxycodone prn. Flexeril prn for spasms. Will schedule gabapentin tid instead of prn.  4. Mood: Team to provide ego support.              -antipsychotic agents: N/a 5. Neuropsych: This patient is intermitently capable of making decisions on her own behalf. 6. Skin/Wound Care: Routine pressure relief measures. Encourage protein supplements.  7. Fluids/Electrolytes/Nutrition: Monitor I/O. Check lytes in am.  8. Fluid overload: On Lasix daily--add low dose potassium to prevent intermittent hypokalemia.  Monitor for signs of overload, HH diet and check  daily weights.    Filed Weights   02/03/20 0500 02/04/20 0500 02/05/20 0330  Weight: 59.1 kg 57.1 kg 59.1 kg   7/30- up 2.5 kg, however different bed/scale- will monitor- K+ 4.6- stable on meds; Mg 1.9- good level  7/31 - weight down to 57.1 kg  8/1- Weight back to 59.1 kg- same weight 2 days ago 9. ABLA: H/H trending down 10.6-->9.3--> 8.4-->9.3.  Monitor for signs of bleeding. Increase iron supplement to bid.  Will order stool guaiacs. Recheck CBC in am.   7/30- Hb stable at 8.3- no changes- con't to monitor 10.  A fib with RVR: Monitor HR tid--continue Lopressor 50 mg bid with amiodarone 400 mg  bid. Well controlled  7/30- rate ~ 100 but regular rhythm- per EKG- was checked 2x this AM  7/31- Troponins trending down- Cards doesn't see  A need to see her in f/u unless new Sx's occur.   8/1- rate 72 this AM- doing well- no Afib Hypocalcemia:  Ionized calcium 0.96.  Improved. Will add oral supplement.  7/30- Ca 8.1- will con't Ca supplements 11. Anxiety disorder: Change Klonopin to PRN as it is oversedating her as per sister- she takes PRN at home  7/30- change klonopin to prn and 0.25 mg BID prn- needs some anxiety meds so will not d/c.  8/1- anxiety improved- still needsa lot of reassurance, but now about d/c date and how she's doing.  12. Dysphagia  7/30- will order SLP for swallowing due to difficulties taking protein supplement per RN and PT/OT this AM. Is ordered   7/31- 1-2 more SLP sessions just to verify pt doing ok- notes is slow eater  LOS: 3 days A FACE TO FACE EVALUATION WAS PERFORMED  Keiran Gaffey 02/05/2020, 3:00 PM

## 2020-02-05 NOTE — Progress Notes (Signed)
[facility] for Warfarin Indication: Atrial Fibrillation, Mitral Valve Repair  Patient Measurements: Total Body Weight: 55.4 kg Height: 66 inches  Vital Signs: Temp: 98.2 F (36.8 C) (08/01 0330) Temp Source: Oral (08/01 0330) BP: 101/60 (08/01 0330) Pulse Rate: 72 (08/01 0330)  Labs: Recent Labs    02/03/20 0702 02/03/20 1544 02/03/20 1749 02/04/20 0508 02/05/20 0532  HGB 8.3*  --   --   --   --   HCT 27.7*  --   --   --   --   PLT 260  --   --   --   --   LABPROT 17.1*  --   --  16.9* 18.8*  INR 1.4*  --   --  1.4* 1.6*  CREATININE 0.71  --   --   --   --   TROPONINIHS  --  95* 79*  --   --     Estimated Creatinine Clearance: 56.7 mL/min (by C-G formula based on SCr of 0.71 mg/dL).   Assessment: 74 yr old female admitted to CIR from acute care; S/P minimally invasive mitral valve repair, complete MAZE procedure and sternotomy on 01/17/20. She was started on warfarin for mitral valve repair on 7/14, which was interrupted due to dark stools/FOBT positive. Warfarin was restarted at 2 mg daily on 7/22, and INR trended up to 5.6 on 7/28, for which pt received vitamin K. Follow up INR was 1.5 and she was resumed on warfarin 1 mg po daily (goal range 2.5-3.5, per Dr. Roxy Manns discharge summary). Pharmacy was consulted by Rehab Medicine to dose warfarin.  8/1 AM Update: INR is subtherapeutic today at 1.6, trending up from yesterday. Most recent H/H is low but stable. Plt wnl. No s/sx bleeding noted.  Patient recently started amiodarone on 7/23, which potentially contributed to elevated INR although interaction typically not observed for weeks-months. Patient likely more sensitive to warfarin, will continue to conservatively dose to avoid supertherapeutic levels w/ recent surgery.  Goal of Therapy:  INR: 2.5-3.5 Monitor platelets by anticoagulation protocol: Yes   Plan:  Warfarin 1 mg PO X 1 today Monitor daily INR, CBC Monitor for  signs/symptoms of bleeding  Fara Olden, PharmD PGY-1 Pharmacy Resident 02/05/2020 8:54 AM  Please check [email or web address] for unit-specific pharmacy phone numbers.

## 2020-02-05 NOTE — Progress Notes (Signed)
      [address]       [place], [address]             [phone]      Kyleena J Austin [phone]    Spoke with Ms. Lobb today in [place].  She states she is doing okay.  She states they worked her hard yesterday with 4 sessions.  She does feel like she is getting stronger.  She seems in better spirits as well.  Gen: no apparent distress Heart: RRR Lungs: CTA bilaterally Incisions: C/D/I  A/P:  1. S/P MI MV Repair- doing well, progressing and working with PT 2. Chronic Pain/anxiety issues stable- patient seems in better spirits today 3. Baseline Cognitive status stable 4. INR 1.6, would continue coumadin slowly  Ellwood Handler, PA-C

## 2020-02-05 NOTE — IPOC Note (Addendum)
Overall Plan of Care [facility]) Patient Details Name: ASHANTAE PANGALLO MRN: [id number] DOB: 07-Sep-1945  Admitting Diagnosis: [facility] Problems: Principal Problem:   Debility     Functional Problem List: Nursing Behavior, Bladder, Edema, Endurance, Medication Management, Pain, Perception, Safety, Skin Integrity  PT Balance, Safety, Endurance, Motor  OT Balance, Skin Integrity, Endurance, Edema, Safety  SLP Nutrition  TR         Basic ADL's: OT Grooming, Bathing, Dressing, Toileting     Advanced  ADL's: OT       Transfers: PT Bed Mobility, Bed to Chair, Car, Manufacturing systems engineer, Metallurgist: PT Ambulation     Additional Impairments: OT None  SLP Swallowing      TR      Anticipated Outcomes Item Anticipated Outcome  Self Feeding n/a  Swallowing  mod I   Basic self-care  mod I  Toileting  mod I   Bathroom Transfers mod I  Bowel/Bladder  mod I  Transfers  Mod I  Locomotion  Mod I w/ LRAD  Communication     Cognition     Pain  pain less than2  Safety/Judgment  modI   Therapy Plan: PT Intensity: Minimum of 1-2 x/day ,45 to 90 minutes PT Frequency: 5 out of 7 days PT Duration Estimated Length of Stay: 7-10 days OT Intensity: Minimum of 1-2 x/day, 45 to 90 minutes OT Frequency: 5 out of 7 days OT Duration/Estimated Length of Stay: ~7-10 days SLP Intensity: Minumum of 1-2 x/day, 30 to 90 minutes SLP Frequency: 1 to 3 out of 7 days SLP Duration/Estimated Length of Stay: 1-2 additional visits   Due to the current state of emergency, patients may not be receiving their 3-hours of Medicare-mandated therapy.   Team Interventions: Nursing Interventions Patient/Family Education, Pain Management, Bladder Management, Disease Management/Prevention, Skin Care/Wound Management, Cognitive Remediation/Compensation, Medication Management  PT interventions Ambulation/gait training, Community reintegration, Neuromuscular re-education, Stair  training, UE/LE Strength taining/ROM, Training and development officer, Discharge planning, Therapeutic Activities, UE/LE Coordination activities, Patient/family education, Therapeutic Exercise  OT Interventions Balance/vestibular training, Discharge planning, Pain management, Self Care/advanced ADL retraining, Therapeutic Activities, UE/LE Coordination activities, Cognitive remediation/compensation, Disease mangement/prevention, Functional mobility training, Patient/family education, Skin care/wound managment, Therapeutic Exercise, Community reintegration, Engineer, drilling, Neuromuscular re-education, Psychosocial support, UE/LE Strength taining/ROM  SLP Interventions Dysphagia/aspiration precaution training, English as a second language teacher, Patient/family education  TR Interventions    SW/CM Interventions Discharge Planning, Psychosocial Support, Patient/Family Education   Barriers to Discharge MD  Home enviroment access/loayout, Wound care, Lack of/limited family support, Weight, Weight bearing restrictions and Behavior  Nursing Other (comments)    PT Home environment access/layout    OT      SLP      SW       Team Discharge Planning: Destination: PT-Home ,OT- Home , SLP-Home Projected Follow-up: PT-Home health PT, OT-  Home health OT, SLP-None Projected Equipment Needs: PT-To be determined, OT- To be determined, SLP-None recommended by SLP Equipment Details: PT- , OT-  Patient/family involved in discharge planning: PT- Family member/caregiver, Patient,  OT- Patient, SLP-Patient  MD ELOS: 7-10 days per therapy Medical Rehab Prognosis:  Good Assessment: Pt is a 74 yr old female with significant anxiety s/p MAZE prcedure And mitral valve repair with sternal precautions- on coumadin- INR up to 1.6; had Afib with RVR- out of Afib, rate well controlled currently; consulted SLP for dysphagia self reported by pt/family.   Goals mod I by d/c.    See Team Conference  Notes for weekly updates  to the plan of care

## 2020-02-06 ENCOUNTER — Inpatient Hospital Stay (HOSPITAL_COMMUNITY): Payer: Medicare Other | Admitting: Physical Therapy

## 2020-02-06 ENCOUNTER — Inpatient Hospital Stay (HOSPITAL_COMMUNITY): Payer: Medicare Other | Admitting: Occupational Therapy

## 2020-02-06 ENCOUNTER — Inpatient Hospital Stay (HOSPITAL_COMMUNITY): Payer: Medicare Other

## 2020-02-06 LAB — CBC WITH DIFFERENTIAL/PLATELET
Abs Immature Granulocytes: 0.05 10*3/uL (ref 0.00–0.07)
Basophils Absolute: 0 10*3/uL (ref 0.0–0.1)
Basophils Relative: 0 %
Eosinophils Absolute: 0.1 10*3/uL (ref 0.0–0.5)
Eosinophils Relative: 2 %
HCT: 33 % — ABNORMAL LOW (ref 36.0–46.0)
Hemoglobin: 9.7 g/dL — ABNORMAL LOW (ref 12.0–15.0)
Immature Granulocytes: 1 %
Lymphocytes Relative: 10 %
Lymphs Abs: 0.7 10*3/uL (ref 0.7–4.0)
MCH: 25.9 pg — ABNORMAL LOW (ref 26.0–34.0)
MCHC: 29.4 g/dL — ABNORMAL LOW (ref 30.0–36.0)
MCV: 88 fL (ref 80.0–100.0)
Monocytes Absolute: 0.5 10*3/uL (ref 0.1–1.0)
Monocytes Relative: 8 %
Neutro Abs: 5.5 10*3/uL (ref 1.7–7.7)
Neutrophils Relative %: 79 %
Platelets: 355 10*3/uL (ref 150–400)
RBC: 3.75 MIL/uL — ABNORMAL LOW (ref 3.87–5.11)
RDW: 20 % — ABNORMAL HIGH (ref 11.5–15.5)
WBC: 6.8 10*3/uL (ref 4.0–10.5)
nRBC: 0 % (ref 0.0–0.2)

## 2020-02-06 LAB — BASIC METABOLIC PANEL
Anion gap: 9 (ref 5–15)
BUN: 12 mg/dL (ref 8–23)
CO2: 34 mmol/L — ABNORMAL HIGH (ref 22–32)
Calcium: 8.9 mg/dL (ref 8.9–10.3)
Chloride: 96 mmol/L — ABNORMAL LOW (ref 98–111)
Creatinine, Ser: 0.76 mg/dL (ref 0.44–1.00)
GFR calc Af Amer: >60 mL/min (ref >60)
GFR calc non Af Amer: >60 mL/min (ref >60)
Glucose, Bld: 107 mg/dL — ABNORMAL HIGH (ref 70–99)
Potassium: 4.4 mmol/L (ref 3.5–5.1)
Sodium: 139 mmol/L (ref 135–145)

## 2020-02-06 LAB — PROTIME-INR
INR: 1.7 — ABNORMAL HIGH (ref 0.8–1.2)
Prothrombin Time: 19.7 seconds — ABNORMAL HIGH (ref 11.4–15.2)

## 2020-02-06 MED ORDER — WARFARIN SODIUM 1 MG PO TABS
1.5000 mg | ORAL_TABLET | Freq: Once | ORAL | Status: DC
  Filled 2020-02-06: qty 1

## 2020-02-06 MED ORDER — FUROSEMIDE 10 MG/ML IJ SOLN
40.0000 mg | Freq: Once | INTRAMUSCULAR | Status: AC
  Administered 2020-02-06: 40 mg via INTRAVENOUS
  Filled 2020-02-06: qty 4

## 2020-02-06 MED ORDER — ENSURE ENLIVE PO LIQD
237.0000 mL | Freq: Two times a day (BID) | ORAL | Status: DC
  Administered 2020-02-07 – 2020-02-08 (×3): 237 mL via ORAL

## 2020-02-06 MED ORDER — WARFARIN SODIUM 2 MG PO TABS
2.0000 mg | ORAL_TABLET | Freq: Once | ORAL | Status: AC
  Administered 2020-02-06: 2 mg via ORAL
  Filled 2020-02-06: qty 1

## 2020-02-06 NOTE — Progress Notes (Signed)
[facility] PHYSICAL MEDICINE & REHABILITATION PROGRESS NOTE   Subjective/Complaints:  Pleased with plan for DC Wednesday, Sister [name] is at bedside Discussed plan for Lovenox to Coumadin bridge and IV Lasix today.  Labs excellent  ROS: Pt denies SOB, abd pain, CP, N/V/C/D, and vision changes  Objective:   No results found. Recent Labs    02/06/20 0554  WBC 6.8  HGB 9.7*  HCT 33.0*  PLT 355   Recent Labs    02/06/20 0554  NA 139  K 4.4  CL 96*  CO2 34*  GLUCOSE 107*  BUN 12  CREATININE 0.76  CALCIUM 8.9    Intake/Output Summary (Last 24 hours) at 02/06/2020 1036 Last data filed at 02/06/2020 0730 Gross per 24 hour  Intake 120 ml  Output --  Net 120 ml     Physical Exam: Vital Signs Blood pressure (!) 134/67, pulse 82, temperature 98.2 F (36.8 C), temperature source Oral, resp. rate 20, height 5\' 6"  (1.676 m), weight 58.4 kg, SpO2 99 %. General: Alert and oriented x 3, No apparent distress HEENT: Head is normocephalic, atraumatic, PERRLA, EOMI, sclera anicteric, oral mucosa pink and moist, dentition intact, ext ear canals clear,  Neck: Supple without JVD or lymphadenopathy Heart: Reg rate and rhythm. No murmurs rubs or gallops Chest: CTA bilaterally without wheezes, rales, or rhonchi; no distress Abdomen: Soft, non-tender, non-distended, bowel sounds positive. Extremities: No clubbing, cyanosis, or edema. Pulses are 2+ Skin: Sternal incision C/D/I- looks good- healing well Neuro: Pt is cognitively appropriate with normal insight, memory, and awareness. Cranial nerves 2-12 are intact. Sensory exam is normal. Reflexes are 2+ in all 4's. Fine motor coordination is intact. No tremors. Motor function is grossly 4/5.  Musculoskeletal: Full ROM, No pain with AROM or PROM in the neck, trunk, or extremities. Posture appropriate Psych: Pt's affect is appropriate. Pt is cooperative   Assessment/Plan: 1. Functional deficits secondary to debility due to MAZE procedure  which require 3+ hours per day of interdisciplinary therapy in a comprehensive inpatient rehab setting.  Physiatrist is providing close team supervision and 24 hour management of active medical problems listed below.  Physiatrist and rehab team continue to assess barriers to discharge/monitor patient progress toward functional and medical goals  Care Tool:  Bathing    Body parts bathed by patient: Right arm, Left arm, Chest, Abdomen, Front perineal area, Buttocks, Right upper leg, Left upper leg, Face   Body parts bathed by helper: Right lower leg, Left lower leg     Bathing assist Assist Level: Minimal Assistance - Patient > 75%     Upper Body Dressing/Undressing Upper body dressing   What is the patient wearing?: Pull over shirt    Upper body assist Assist Level: Minimal Assistance - Patient > 75%    Lower Body Dressing/Undressing Lower body dressing      What is the patient wearing?: Pants, Underwear/pull up     Lower body assist Assist for lower body dressing: Minimal Assistance - Patient > 75%     Toileting Toileting    Toileting assist Assist for toileting: Supervision/Verbal cueing     Transfers Chair/bed transfer  Transfers assist  Chair/bed transfer activity did not occur: Safety/medical concerns  Chair/bed transfer assist level: Minimal Assistance - Patient > 75%     Locomotion Ambulation   Ambulation assist      Assist level: Contact Guard/Touching assist Assistive device: Walker-rolling Max distance: 100   Walk 10 feet activity   Assist  Assist level: Minimal Assistance - Patient > 75% Assistive device: Walker-rolling   Walk 50 feet activity   Assist Walk 50 feet with 2 turns activity did not occur: Safety/medical concerns (no out of room per PA)  Assist level: Contact Guard/Touching assist      Walk 150 feet activity   Assist Walk 150 feet activity did not occur: Safety/medical concerns (no out of room per PA.)          Walk 10 feet on uneven surface  activity   Assist Walk 10 feet on uneven surfaces activity did not occur: Safety/medical concerns         Wheelchair     Assist Will patient use wheelchair at discharge?: No Type of Wheelchair: Manual Wheelchair activity did not occur: Safety/medical concerns         Wheelchair 50 feet with 2 turns activity    Assist    Wheelchair 50 feet with 2 turns activity did not occur: Safety/medical concerns       Wheelchair 150 feet activity     Assist  Wheelchair 150 feet activity did not occur: Safety/medical concerns       Blood pressure (!) 134/67, pulse 82, temperature 98.2 F (36.8 C), temperature source Oral, resp. rate 20, height 5\' 6"  (1.676 m), weight 58.4 kg, SpO2 99 %.  Medical Problem List and Plan: 1.  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  PAF/MVR/Antithrombotics: -DVT/anticoagulation:  Pharmaceutical: Now on Coumadin--INR subtherapeutic 5.6-->1.5 after Vitamin K on 7/28. Pharmacy to assist with management--INR goal 2-2.5  8/2: INR 1.7             -antiplatelet therapy: N/A 3. Chronic back pain/Pain Management: Fentanyl 25 mcg/hr with Oxycodone prn. Flexeril prn for spasms. Will schedule gabapentin tid instead of prn. Well controlled 4. Mood: Team to provide ego support.              -antipsychotic agents: N/a 5. Neuropsych: This patient is intermitently capable of making decisions on her own behalf. 6. Skin/Wound Care: Routine pressure relief measures. Encourage protein supplements.  7. Fluids/Electrolytes/Nutrition: Monitor I/O. Check lytes in am.  8. Fluid overload: On Lasix daily--add low dose potassium to prevent intermittent hypokalemia.  Monitor for signs of overload, HH diet and check daily weights.    Filed Weights   02/04/20  0500 02/05/20 0330 02/06/20 0408  Weight: 57.1 kg 59.1 kg 58.4 kg   8/2: weight downtrending. Can give IV lasix today given higher BP 9. ABLA: H/H trending down 10.6-->9.3--> 8.4-->9.3.  Monitor for signs of bleeding. Increase iron supplement to bid.  Will order stool guaiacs. Recheck CBC in am.   7/30- Hb stable at 8.3- no changes- con't to monitor 10.  A fib with RVR: Monitor HR tid--continue Lopressor 50 mg bid with amiodarone 400 mg bid. Well controlled  7/30- rate ~ 100 but regular rhythm- per EKG- was checked 2x this AM  7/31- Troponins trending down- Cards doesn't see  A need to see her in f/u unless new Sx's occur.   8/1- rate 72 this AM- doing well- no Afib Hypocalcemia:  Ionized calcium 0.96.  Improved. Will add oral supplement.  7/30- Ca 8.1- will con't Ca supplements 11. Anxiety disorder:  Change Klonopin to PRN as it is oversedating her as per sister- she takes PRN at home  7/30- change klonopin to prn and 0.25 mg BID prn- needs some anxiety meds so will not d/c.  8/1- anxiety improved- still needsa lot of reassurance, but now about d/c date and how she's doing.  12. Dysphagia  7/30- will order SLP for swallowing due to difficulties taking protein supplement per RN and PT/OT this AM. Is ordered   7/31- 1-2 more SLP sessions just to verify pt doing ok- notes is slow eater  LOS: 4 days A FACE TO FACE EVALUATION WAS PERFORMED  Clide Deutscher Amiaya Mcneeley 02/06/2020, 10:36 AM

## 2020-02-06 NOTE — Progress Notes (Signed)
Occupational Therapy Session Note  Patient Details  Name: [name] MRN: [id number] Date of Birth: Dec 15, 1945  Today's Date: 02/06/2020 OT Individual Time: 1300-1400 OT Individual Time Calculation (min): 60 min    Short Term Goals: Week 1:  OT Short Term Goal 1 (Week 1): LTG=STG  Skilled Therapeutic Interventions/Progress Updates:    Pt in wheelchair at start of session with her sister present.  She was agreeable to completion of therapy, but stated that she was fatigued.  Therapist took her down to the dayroom where she transferred to the [facility] for endurance building.  Min assist needed for sit to stand from the wheelchair following her sternal precautions.  She was able to complete 3 intervals of 1.5, 2, and 3 mins at resistance level of 40 cm/sec.  Next, she worked on sit to stand from the wheelchair as well as short distance functional mobility with use of the RW for support.  Min assist was needed for sit to stand again, with min guard for balance with mobility once standing.  Endurance level is still limited at this time with O2 sats remaining at 92% or better on room air and HR at 68 BPM.  Discussed home setup with pt and DME needs.  After discussion she voiced the need for a 3:1 for her upstairs bathroom to assist with sit to stand as this toilet is regular height and she has a stair lift, so she may be up there at times.  Will put in request for this item with SW.  Finished session with return to the room and pt remaining up in the wheelchair.  NT notified of pt's request to go to the bathroom as well.   Therapy Documentation Precautions:  Precautions Precautions: Sternal, Fall Restrictions Weight Bearing Restrictions: No Other Position/Activity Restrictions: Sternal precautions  Pain: Pain Assessment Pain Scale: 0-10 Pain Score: 5  ADL: See Care Tool Section for some details of mobility and selfcare  Therapy/Group: Individual Therapy  Almer Littleton OTR/L 02/06/2020, 4:03  PM

## 2020-02-06 NOTE — Progress Notes (Signed)
Physical Therapy Session Note  Patient Details  Name: [name] MRN: [id number] Date of Birth: 06-13-1946  Today's Date: 02/06/2020 PT Individual Time: 0800-0905 PT Individual Time Calculation (min): 65 min   Short Term Goals: Week 1:  PT Short Term Goal 1 (Week 1): = LTG due to LOS  Skilled Therapeutic Interventions/Progress Updates:     Patient in recliner with her sister in the room upon PT arrival. Patient alert and agreeable to PT session. Patient denied pain during session. Patient requested to discuss d/c plan at beginning of session. Expressed that she felt that her previous back surgery had prepared her for handling mobility at home and would like to d/c home asap. Discussed barriers to d/c and sternal precautions, patient able to recall 1/3 at beginning of session. Reviewed sternal precautions with patient. Patient's sister reports that she will be a main caregiver at d/c and that 2 other family members would be able to provide assist when patient d/c from hospital and 2 more family members would be coming in at the end of the week to assist. Patient will have 24/7 assist at home as soon as she is ready for d/c. Patient's sister agreeable to participating in hands on training throughout session.  Therapeutic Activity: Bed Mobility: Patient performed supine to/from sit with supervision on a regular bed. Provided verbal cues for maintaining sternal precautions when sitting up by performing supine>side-lying>sitting. Transfers: Patient performed sit to/from stand from hospital recliner, w/c, ADL bed, and standard arm chair with min A with use of RW. Provided verbal cues for forward weight shift and placing hands on thighs with light touch when standing or sitting to maintain sternal precautions. Discussed home furniture and educated on sitting on more elevated surfaces to reduce level of assist with transfers at this time. Patient and her sister stated understanding. Patient sister provided  assist x3 with safe guarding/assist technique. Encouraged her sister to cue the patient on hand placement prior to standing/sitting.  Patient performed a simulated low SUV height (RAV 4) car transfer with min A-CGA using RW. Provided cues for safe technique.   Gait Training:  Patient ambulated 25 feet x3 over various household surfaces, tile/carpet, to simulate home ambulation using RW with CGA-close supervision. Ambulated with decreased gait speed, decreased step length and height, increased B hip and knee flexion in stance, forward trunk lean, and downward head gaze. Provided verbal cues for light touch on RW, erect posture, looking ahead, and increased step height for safety. Educated on pulling up throw rugs and cords to avoid tripping hazards at home. Patient with flip-flops in the room, recommended use of tennis shoes or slip ons with a heel for improved safety with ambulation, patient reports that she has several pairs of appropriate shoes at home.  Patient ascended/descended 4 steps x2 using R rail with CGA. Performed step-to gait pattern leading with R while ascending and L while descending. Provided cues for technique and sequencing. Patient's sister following PT's demonstration on first trial and providing safe guarding during second trial.  Wheelchair Mobility:  Patient was transported in the w/c with total A throughout session for energy conservation and time management.  Patient required increased time and rest breaks throughout session due to fatigue and mild SOB with activity. Provided education during rest breaks. Educated patient and her sister on fall risk/prevention, home modifications to prevent falls, and activation of emergency services in the event of a fall. Discussed recommendations for HHPT and patient reports that she has a RW  and manual w/c from her previous back surgery. Patient and her sister demonstrated good recall of education and safety with mobility throughout session.  Will discuss d/c plan with lead therapists for d/c in the next 2-3 days.   Patient in bed at end of session with breaks locked, bed alarm set, and all needs within reach.    Therapy Documentation Precautions:  Precautions Precautions: Sternal, Fall Restrictions Weight Bearing Restrictions: No Other Position/Activity Restrictions: Sternal precautions    Therapy/Group: Individual Therapy  [name] PT, DPT  02/06/2020, 4:10 PM

## 2020-02-06 NOTE — Progress Notes (Signed)
Occupational Therapy Session Note  Patient Details  Name: [name] MRN: [id number] Date of Birth: 1945/11/23  Today's Date: 02/06/2020 OT Individual Time: 0930-1015 OT Individual Time Calculation (min): 45 min  and Today's Date: 02/06/2020 OT Missed Time: 15 Minutes Missed Time Reason: Patient fatigue   Short Term Goals: Week 1:  OT Short Term Goal 1 (Week 1): LTG=STG  Skilled Therapeutic Interventions/Progress Updates:    Pt resting in bed upon arrival with sister, [name], present.  Pt agreeable to therapy but stated she was still tired from earlier PT session. Pt and sister inquired about discharge.  OTA had previously discussed with PT regarding discharge.  If medically cleared, Wednesday (8/4) is target discharge date. Pt and sister in agreement.  Discussed DME needs. Pt currently has a shower chair and does not need a BSC (has raised tolet at home). Pt will not have to step over ledge into shower. OT intervention with focus on bed moblity, sit<>stand, standing balance, functional transfers, and functional amb with RW to prepare for discharge home Wednesday.  Pt practiced toilet transfers and shower seat transfers.  Pt initially performed at supervision level but required min A as she became more fatigued.  Pt returned to room and remained in bed with all needs within reach and bed alarm activated. Pt's sister present.   Therapy Documentation Precautions:  Precautions Precautions: Sternal, Fall Restrictions Weight Bearing Restrictions: No Other Position/Activity Restrictions: Sternal precautions General: General OT Amount of Missed Time: 15 Minutes  Pain: Pt c/o increased sternal pain with activity; RN aware and returned to bed, pt aware that she can have more medications at 10:30  Therapy/Group: Individual Therapy  Leroy Libman 02/06/2020, 10:31 AM

## 2020-02-06 NOTE — Progress Notes (Signed)
ANTICOAGULATION CONSULT NOTE - Follow Up Consult  Pharmacy Consult for Warfarin Indication: atrial fibrillation and s/p mitral valve reparir  Allergies  Allergen Reactions  . Buprenorphine Nausea Only and Other (See Comments)    sedation and adhesive reaction  . Contrast Media [Iodinated Diagnostic Agents] Hives  . Cymbalta [Duloxetine Hcl]     Severe diarrhea and upset stomach  . Erythromycin Nausea And Vomiting    Dizziness   . Hydrocodone     Sedation,dizziness, and nausea  . Hydromorphone     Sedation,dizziness, and nausea  . Latex      hives and sores  . Levofloxacin Nausea And Vomiting  . Metrizamide Hives  . Nsaids     CANNOT TAKE PER CARDIOLOGIST DUE TO AFIB   . Nucynta [Tapentadol Hcl]     nausea and sedation  . Other     CAN NOT TAKE , aLPRAZOLAM, OR ELAVIL DUE TO AFIB FOR ANXIETY  IV CONTRAST /"DYE".  . Oxycodone     Delusions (intolerance)  PILLS ONLY sedation, dizziness, nausea,  and itching  . Pentazocine     Unknown  . Septra [Bactrim]     Hives   . Sulfa Antibiotics Hives    rash  . Sulfasalazine Hives  . Adhesive [Tape] Rash and Other (See Comments)    Heart monitor stickers must be rotated in order to prevent rash  . Morphine Anxiety    sedation and nausea    Patient Measurements: Height: 5\' 6"  (167.6 cm) Weight: 58.4 kg (128 lb 12 oz) IBW/kg (Calculated) : 59.3  Vital Signs: Temp: 98.2 F (36.8 C) (08/02 0314) Temp Source: Oral (08/02 0314) BP: 134/67 (08/02 0314) Pulse Rate: 82 (08/02 0314)  Labs: Recent Labs    02/03/20 1544 02/03/20 1749 02/04/20 0508 02/05/20 0532 02/06/20 0554  HGB  --   --   --   --  9.7*  HCT  --   --   --   --  33.0*  PLT  --   --   --   --  355  LABPROT  --   --  16.9* 18.8* 19.7*  INR  --   --  1.4* 1.6* 1.7*  CREATININE  --   --   --   --  0.76  TROPONINIHS 95* 79*  --   --   --     Estimated Creatinine Clearance: 56 mL/min (by C-G formula based on SCr of 0.76 mg/dL).  Assessment:  74 yr old  female admitted to [facility] from acute care; S/P minimally invasive mitral valve repair, complete MAZE procedure and sternotomy on 01/17/20. She was started on warfarin for mitral valve repair on 7/14, which was interrupted due to dark stools/FOBT positive. Warfarin was restarted at 2 mg daily on 7/22, and INR trended up to 5.6 on 7/28, for which pt received vitamin K. Follow up INR was 1.5 and she was resumed on warfarin 1 mg po daily (goal range 2.5-3.5, per Dr. Roxy Manns discharge summary). Pharmacy was consulted by Rehab Medicine to dose warfarin.    INR remains subtherapeutic (1.7), trending up slowly but little change from yesterday (INR 1.6) after decr dose to 1 mg.   Hgb improved 9.7. Amiodarone begun 7/23 and 400 mg TID planned until f/u in [facility] on 8/6 per prior EP plan.  Has had some sensitivity to warfarin doses and likely some influence due to Amiodarone. And also some resistance due to recent Vitamin K dose.      Discussed  with Ellwood Handler, PA-C and confirmed target INR 2.5-3.5, but aiming for lower end. She states that they would not normally bridge with Lovenox, but let INR rise slowly to goal.  Discussed with P. Love, PA-C. Planning discharge on 8/4.  Goal of Therapy:  2.5-3.5, but aiming for lower end Monitor platelets by anticoagulation protocol: Yes   Plan:   Warfarin 2 mg x 1 today.  Daily PT/INR.  Monitor for s/sx bleeding.  Arty Baumgartner, [place] Phone: [phone] 02/06/2020,1:58 PM

## 2020-02-07 ENCOUNTER — Inpatient Hospital Stay (HOSPITAL_COMMUNITY): Payer: Medicare Other | Admitting: Speech Pathology

## 2020-02-07 ENCOUNTER — Ambulatory Visit (HOSPITAL_COMMUNITY): Payer: Medicare Other | Admitting: Physical Therapy

## 2020-02-07 ENCOUNTER — Ambulatory Visit: Payer: Medicare Other | Admitting: Physician Assistant

## 2020-02-07 ENCOUNTER — Inpatient Hospital Stay (HOSPITAL_COMMUNITY): Payer: Medicare Other | Admitting: Physical Therapy

## 2020-02-07 ENCOUNTER — Telehealth: Payer: Self-pay | Admitting: Pharmacist

## 2020-02-07 ENCOUNTER — Encounter (HOSPITAL_COMMUNITY): Payer: Medicare Other

## 2020-02-07 LAB — PROTIME-INR
INR: 1.8 — ABNORMAL HIGH (ref 0.8–1.2)
Prothrombin Time: 20.1 seconds — ABNORMAL HIGH (ref 11.4–15.2)

## 2020-02-07 MED ORDER — WARFARIN SODIUM 2 MG PO TABS
2.0000 mg | ORAL_TABLET | Freq: Once | ORAL | Status: AC
  Administered 2020-02-07: 2 mg via ORAL
  Filled 2020-02-07 (×2): qty 1

## 2020-02-07 MED ORDER — FENTANYL 12 MCG/HR TD PT72
1.0000 | MEDICATED_PATCH | TRANSDERMAL | Status: DC
  Administered 2020-02-07: 1 via TRANSDERMAL
  Filled 2020-02-07: qty 1

## 2020-02-07 NOTE — Progress Notes (Signed)
Patient remains up in chair, with facial grimaces of discomfort, she verbalized that she "need more pain medications and something to make her go to sleep" . Appears very anxious, and somewhat tearful, support provided. Explain and reviewed prior medications administered and frequency of her orders. . States at home she is able to take her medications at different times when she wakes up like this. Again reviewed medications ordered by her MD,and the limited time sleep medications can be administered,indicating she understand. . Patient states she is unable to take Flexeril medication because" it makes her stomach hurts". Provided po Tylenol and assisted back to bed by NT.Made as comfortable as possible, feet elevated on pillows x2 , due to noted swelling of feet. Monitor

## 2020-02-07 NOTE — Patient Care Conference (Signed)
Inpatient RehabilitationTeam Conference and Plan of Care Update Date: 02/07/2020   Time: 11:05 AM    Patient Name: [name]      Medical Record Number: [id number]  Date of Birth: Feb 24, 1946 Sex: Female         Room/Bed: 4W13C/4W13C-01 Payor Info: Payor: MEDICARE / Plan: MEDICARE PART A AND B / Product Type: *No Product type* /    Admit Date/Time:  02/02/2020  3:40 PM  Primary Diagnosis:  [facility] Problems: Principal Problem:   Debility    Expected Discharge Date: Expected Discharge Date: 02/08/20  Team Members Present: Physician leading conference: Dr. Leeroy Cha Care Coodinator Present: Loralee Pacas, LCSWA;Kassie Keng Creig Hines, RN, BSN, [place] Nurse Present: Other (comment) Janeal Holmes, RN) PT Present: Excell Seltzer, PT OT Present: Willeen Cass, OT;Roanna Epley, COTA PPS Coordinator present : Ileana Ladd, Burna Mortimer, SLP     Current Status/Progress Goal Weekly Team Focus  Bowel/Bladder   Patient is continent of bladder and bowel, LBM 02/05/2020, has schedule and prn medicatios  Maintain continence, toileting needs  Assess toileting Q2 hrs/prn, Offer assistance to maintain patient regularity   Swallow/Nutrition/ Hydration   Regular textures with thin liquids, Mod I  Mod I  tolerance of diet and use of swallowing strategies   ADL's   Supervision for UB selfcare with min assist for sit to stand with LB selfcare and for transfers with use of the RW.  Decreased LE strength for sit to stand from wheelchair or lower surfaces without use of the arm rests for support.  currently modified independent  selfcare retraining, balance retraining, transfer training, DME education, pt education, family education   Mobility   CGA to min A overall with RW, gait up to 170' with RW CGA, CGA to min A stairs  mod I  d/c planning, family education   Communication             Safety/Cognition/ Behavioral Observations            Pain   Patient c/o surgical chest  soreness and chronic back pain, rate pain 7-8/10 on pain scale  medicated with PRN Q4hrs,, PRN Tylenol for mild pain ,  maintain pain level < 3  Assess and address pain QS/PRN with follow up   Skin   s/p surgical chest incision healing well, incision CDI no drainage  Promote healing and prevent infection  Assess skin QS/PRN,address new skin /incisional skin breakdown     Team Discussion:  Discharge Planning/Teaching Needs:  D/c to home with support from both sisters in which she will have 24/7 care for the first two weeks, and then will determine amount of care she will need  Family education as recommended. Fam Edu 8/3 1pm-3pm.   Current Update:  None  Current Barriers to Discharge:  Wound care and Constipation  Possible Resolutions to Barriers: Teach family dressing and wound care to sacrum wound, give PRN medication to aid in producing a bowel movement before dc.  Patient on target to meet rehab goals: yes, Contact guard/min assist. Family has been in for family education. Dc'd from SLP, and continent of B/B.  On target for discharge.  *See Care Plan and progress notes for long and short-term goals.   Revisions to Treatment Plan:  none    Medical Summary Current Status: sacral wound, heart rate is well controlled, sternal incision pain, INR 1.8, dysphagia Weekly Focus/Goal: daily dressing change, continue current pain regimen, monitor HR TID and continue afib medications, daily INR checks  with lovenox to coumadin bridge  Barriers to Discharge: Medical stability;Wound care  Barriers to Discharge Comments: Lives alone, atrial fibrillation, INR 1.8, sacral wound Possible Resolutions to Barriers: Caregiver training of sisters, lovenox to heparin bridge   Continued Need for Acute Rehabilitation Level of Care: The patient requires daily medical management by a physician with specialized training in physical medicine and rehabilitation for the following reasons: Direction of a  multidisciplinary physical rehabilitation program to maximize functional independence : Yes Medical management of patient stability for increased activity during participation in an intensive rehabilitation regime.: Yes Analysis of laboratory values and/or radiology reports with any subsequent need for medication adjustment and/or medical intervention. : Yes   I attest that I was present, lead the team conference, and concur with the assessment and plan of the team.   Cristi Loron 02/07/2020, 4:28 PM

## 2020-02-07 NOTE — Progress Notes (Signed)
Physical Therapy Session Note  Patient Details  Name: [name] MRN: [id number] Date of Birth: 04-29-1946  Today's Date: 02/07/2020 PT Individual Time: 0800-0900; 1300-1340 PT Individual Time Calculation (min): 60 min and 40 min  Short Term Goals: Week 1:  PT Short Term Goal 1 (Week 1): = LTG due to LOS  Skilled Therapeutic Interventions/Progress Updates:    Session 1: Pt received seated in bed, agreeable to PT session. Pt reports increase in pain in her BLE, back, and in her sternum due to a coughing fit this AM. Pt able to receive pain medication at beginning of therapy session. Bed mobility mod I with use of bedrail and HOB elevated. Sit to stand with min A to RW with cues to adhere to sternal precautions. Stand pivot transfer to w/c with RW and CGA. Ambulation x 170 ft with RW and CGA, flexed trunk posture. Education with patient about upright posture and decreased pressure through BUE on RW with gait. Reviewed handout of HEP: sit to stand, standing BLE hip abd, hip ext, HS curls, and marches. Pt demonstrates good understanding of HEP performance. Pt requests to return to bed at end of session due to fatigue. Stand pivot transfer w/c to bed with RW and CGA. Sit to semi-reclined in bed at mod I level. Pt left semi-reclined in bed with needs in reach, bed alarm in place, sister present at end of therapy session.  Session 2: Pt received seated in w/c in room with her two sisters present for hands-on family education session. Pt requesting a wheelchair with improved back support. Obtained w/c with low contour backrest for improved patient comfort. Pt's sisters report feeling comfortable with assisting pt with stairs and car transfers and have already completed hands-on practice of assisting pt with these tasks. Reviewed sternal precautions, hand placement and assistance level during sit to stand transfer, how to assist pt with short distance gait with RW, and endurance. Pt and her family demonstrate  good understanding and demonstration of safe transfers and mobility with use of RW. Pt left seated in w/c in room with needs in reach, family present.   Therapy Documentation Precautions:  Precautions Precautions: Sternal, Fall Restrictions Weight Bearing Restrictions: No Other Position/Activity Restrictions: Sternal precautions    Therapy/Group: Individual Therapy   Excell Seltzer, PT, DPT  02/07/2020, 10:25 AM

## 2020-02-07 NOTE — Progress Notes (Signed)
Physical Therapy Discharge Summary  Patient Details  Name: [name] MRN: [id number] Date of Birth: 1946/04/03  Today's Date: 02/07/2020  Patient has met 1 of 9 long term goals due to improved activity tolerance, improved balance, increased strength and ability to compensate for deficits.  Patient to discharge at an ambulatory level [place].   Patient's care partner is independent to provide the necessary physical assistance at discharge. Pt's sisters have completed hands-on family education and are safe to assist pt upon d/c home.  Reasons goals not met: Pt did not meet all goals due to a shortened LOS per pt and family request. Pt will be d/c home at a min A level overall whereas goals were set at mod I to Independent. Pt and her family are aware of the level of assist she will require and are able to safely provide this level of assist to her upon d/c home. If patient had fully completed rehab program she would have met mod I goals.  Recommendation:  Patient will benefit from ongoing skilled PT services in home health setting to continue to advance safe functional mobility, address ongoing impairments in endurance, strength, balance, safety, independence with functional mobility, and minimize fall risk.  Equipment: No equipment provided. Pt already owns all necessary equipment.  Reasons for discharge: discharge from hospital  Patient/family agrees with progress made and goals achieved: Yes  PT Discharge Precautions/Restrictions Precautions Precautions: Sternal;Fall Precaution Comments: verbally reviewed precautions Restrictions Weight Bearing Restrictions: No Other Position/Activity Restrictions: Sternal precautions Vision/Perception  Perception Perception: Within Functional Limits Praxis Praxis: Intact  Cognition Overall Cognitive Status: Within Functional Limits for tasks assessed Arousal/Alertness: Awake/alert Orientation Level: Oriented X4 Attention:  Focused;Sustained Focused Attention: Appears intact Sustained Attention: Appears intact Memory: Appears intact Awareness: Appears intact Problem Solving: Appears intact Safety/Judgment: Appears intact Sensation Sensation Light Touch: Appears Intact (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) Proprioception: Appears Intact Coordination Gross Motor Movements are Fluid and Coordinated: No Fine Motor Movements are Fluid and Coordinated: Yes Coordination and Movement Description: impaired 2/2 pain and generalized weakness Motor  Motor Motor: Within Functional Limits Motor - Skilled Clinical Observations: generalized weakness Motor - Discharge Observations: generalized weakness  Mobility Bed Mobility Bed Mobility: Rolling Right;Rolling Left;Sit to Sidelying Right Rolling Right: Independent with assistive device Rolling Left: Independent with assistive device Sit to Sidelying Right: Independent with assistive device Transfers Transfers: Sit to Stand;Stand Pivot Transfers Sit to Stand: Minimal Assistance - Patient > 75% Stand Pivot Transfers: Contact Guard/Touching assist Transfer (Assistive device): Rolling walker Locomotion  Gait Ambulation: Yes Gait Assistance: Contact Guard/Touching assist Gait Distance (Feet): 170 Feet Assistive device: Rolling walker Gait Assistance Details: Verbal cues for precautions/safety Gait Gait: Yes Gait Pattern: Within Functional Limits Gait Pattern: Trunk flexed Gait velocity: decreased Stairs / Additional Locomotion Stairs: Yes Stairs Assistance: Contact Guard/Touching assist Stair Management Technique: One rail Right;Step to pattern Number of Stairs: 4 Height of Stairs: 6 Wheelchair Mobility Wheelchair Mobility: No  Trunk/Postural Assessment  Cervical Assessment Cervical Assessment: Exceptions to [facility] (limited ROM due to previous surgeries) Thoracic Assessment Thoracic Assessment: Exceptions to [facility] (limited rotation) Lumbar Assessment Lumbar  Assessment: Within Functional Limits Postural Control Postural Control: Deficits on evaluation Righting Reactions: delayed  Balance Balance Balance Assessed: Yes Static Sitting Balance Static Sitting - Level of Assistance: 6: Modified independent (Device/Increase time) Dynamic Sitting Balance Dynamic Sitting - Level of Assistance: 6: Modified independent (Device/Increase time) Static Standing Balance Static Standing - Level of Assistance: 5: Stand by assistance Dynamic Standing Balance Dynamic  Standing - Balance Support: Bilateral upper extremity supported;During functional activity Dynamic Standing - Level of Assistance: 5: Stand by assistance;4: Min assist Extremity Assessment   RLE Assessment RLE Assessment: Within Functional Limits General Strength Comments: 4/5 grossly LLE Assessment LLE Assessment: Within Functional Limits General Strength Comments: 4/5 grossly     Excell Seltzer, PT, DPT 02/07/2020, 1:45 PM

## 2020-02-07 NOTE — Progress Notes (Signed)
Speech Language Pathology Discharge Summary  Patient Details  Name: [name] MRN: [id number] Date of Birth: [birth year]  Today's Date: 02/07/2020 SLP Individual Time: 0655-0720 SLP Individual Time Calculation (min): 25 min   Skilled Therapeutic Interventions:   Skilled treatment session focused on dysphagia goals. SLP facilitated session by providing skilled observation with breakfast meal of regular textures with thin liquids. Patient consumed meal with overt cough X 1, suspect due to talking with a full oral cavity. Patient reports she has been "dealing with this for a long time" and independently verbalized compensatory strategies to alleviate symptoms of a suspected esophageal dysphagia. Suspect patient is at her baseline level of swallowing function, therefore, patient will be discharged from skilled SLP intervention and f/u is not warranted. Patient verbalized understanding and agreement. Patient left upright in recliner with all needs within reach.   Patient has met 3 of 3 long term goals.  Patient to discharge at overall Modified Independent level.   Reasons goals not met: N/A   Clinical Impression/Discharge Summary: Patient has made functional gains and has met 3 of 3 LTGs this admission. Currently, patient is consuming regular textures with thin liquids with minimal overt s/s of aspiration and overall Mod I for use of swallowing compensatory strategies. Patient education is complete and patient will discharge home with assistance from family. Patient is at her baseline level of swallow functioning, therefore, skilled SLP f/u is not warranted at this time.   Care Partner:  Caregiver Able to Provide Assistance: Yes     Recommendation:  None      Equipment: N/A   Reasons for discharge: Treatment goals met   Patient/Family Agrees with Progress Made and Goals Achieved: Yes    [name], [place] 02/07/2020, 9:18 AM

## 2020-02-07 NOTE — Progress Notes (Signed)
Patient assisted up to recliner by NT secondary to discomfort to back, reposition for comfort , medicated earlier for pain

## 2020-02-07 NOTE — Progress Notes (Signed)
[facility] PHYSICAL MEDICINE & REHABILITATION PROGRESS NOTE   Subjective/Complaints: Pleased to go home tomorrow. INR 1.8 today.  Still with lower extremity swelling Has some constipation but she feels this will improve at home.   ROS: Pt denies SOB, abd pain, CP, N/V/C/D, and vision changes  Objective:   No results found. Recent Labs    02/06/20 0554  WBC 6.8  HGB 9.7*  HCT 33.0*  PLT 355   Recent Labs    02/06/20 0554  NA 139  K 4.4  CL 96*  CO2 34*  GLUCOSE 107*  BUN 12  CREATININE 0.76  CALCIUM 8.9    Intake/Output Summary (Last 24 hours) at 02/07/2020 0931 Last data filed at 02/07/2020 0740 Gross per 24 hour  Intake 840 ml  Output 400 ml  Net 440 ml     Physical Exam: Vital Signs Blood pressure (!) 111/54, pulse 82, temperature 98.2 F (36.8 C), temperature source Oral, resp. rate 19, height 5\' 6"  (1.676 m), weight 54.5 kg, SpO2 92 %. General: Alert and oriented x 3, No apparent distress HEENT: Head is normocephalic, atraumatic, PERRLA, EOMI, sclera anicteric, oral mucosa pink and moist, dentition intact, ext ear canals clear,  Neck: Supple without JVD or lymphadenopathy Heart: Reg rate and rhythm. No murmurs rubs or gallops Chest: CTA bilaterally without wheezes, rales, or rhonchi; no distress Abdomen: Soft, non-tender, non-distended, bowel sounds positive. Extremities:Edema improved Skin: Sternal incision C/D/I- looks good- healing well Neuro: Pt is cognitively appropriate with normal insight, memory, and awareness. Cranial nerves 2-12 are intact. Sensory exam is normal. Reflexes are 2+ in all 4's. Fine motor coordination is intact. No tremors. Motor function is grossly 4/5.  Musculoskeletal: Full ROM, No pain with AROM or PROM in the neck, trunk, or extremities. Posture appropriate Psych: Pt's affect is appropriate. Pt is cooperative    Assessment/Plan: 1. Functional deficits secondary to debility due to MAZE procedure which require 3+ hours per  day of interdisciplinary therapy in a comprehensive inpatient rehab setting.  Physiatrist is providing close team supervision and 24 hour management of active medical problems listed below.  Physiatrist and rehab team continue to assess barriers to discharge/monitor patient progress toward functional and medical goals  Care Tool:  Bathing    Body parts bathed by patient: Right arm, Left arm, Chest, Abdomen, Front perineal area, Buttocks, Right upper leg, Left upper leg, Face   Body parts bathed by helper: Right lower leg, Left lower leg     Bathing assist Assist Level: Minimal Assistance - Patient > 75%     Upper Body Dressing/Undressing Upper body dressing   What is the patient wearing?: Pull over shirt    Upper body assist Assist Level: Minimal Assistance - Patient > 75%    Lower Body Dressing/Undressing Lower body dressing      What is the patient wearing?: Pants, Underwear/pull up     Lower body assist Assist for lower body dressing: Minimal Assistance - Patient > 75%     Toileting Toileting    Toileting assist Assist for toileting: Supervision/Verbal cueing     Transfers Chair/bed transfer  Transfers assist  Chair/bed transfer activity did not occur: Safety/medical concerns  Chair/bed transfer assist level: Minimal Assistance - Patient > 75% Chair/bed transfer assistive device: Museum/gallery exhibitions officer assist      Assist level: Contact Guard/Touching assist Assistive device: Walker-rolling Max distance: 25 ft   Walk 10 feet activity   Assist     Assist  level: Contact Guard/Touching assist Assistive device: Walker-rolling   Walk 50 feet activity   Assist Walk 50 feet with 2 turns activity did not occur: Safety/medical concerns (no out of room per PA)  Assist level: Contact Guard/Touching assist      Walk 150 feet activity   Assist Walk 150 feet activity did not occur: Safety/medical concerns (no out of room per  PA.)         Walk 10 feet on uneven surface  activity   Assist Walk 10 feet on uneven surfaces activity did not occur: Safety/medical concerns         Wheelchair     Assist Will patient use wheelchair at discharge?: No Type of Wheelchair: Manual Wheelchair activity did not occur: Safety/medical concerns         Wheelchair 50 feet with 2 turns activity    Assist    Wheelchair 50 feet with 2 turns activity did not occur: Safety/medical concerns       Wheelchair 150 feet activity     Assist  Wheelchair 150 feet activity did not occur: Safety/medical concerns       Blood pressure (!) 111/54, pulse 82, temperature 98.2 F (36.8 C), temperature source Oral, resp. rate 19, height 5\' 6"  (1.676 m), weight 54.5 kg, SpO2 92 %.  Medical Problem List and Plan: 1.  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  PAF/MVR/Antithrombotics: -DVT/anticoagulation:  Pharmaceutical: Now on Coumadin--INR subtherapeutic 5.6-->1.5 after Vitamin K on 7/28. Pharmacy to assist with management--INR goal 2-2.5  8/3: INR 1.8             -antiplatelet therapy: N/A 3. Chronic back pain/Pain Management: Fentanyl 25 mcg/hr with Oxycodone prn. Flexeril prn for spasms. Will schedule gabapentin tid instead of prn. Well controlled 4. Mood: Team to provide ego support.              -antipsychotic agents: N/a 5. Neuropsych: This patient is intermitently capable of making decisions on her own behalf. 6. Skin/Wound Care: Routine pressure relief measures. Encourage protein supplements.  7. Fluids/Electrolytes/Nutrition: Monitor I/O. Check lytes in am.  8. Fluid overload: On Lasix daily--add low dose potassium to prevent intermittent hypokalemia.  Monitor for signs of overload, HH diet and check daily weights.     Filed Weights   02/05/20 0330 02/06/20 0408 02/07/20 0500  Weight: 59.1 kg 58.4 kg 54.5 kg   8/2: weight downtrending. Can give IV lasix today given higher BP  8/3: down 4kg! Edema much improved 9. ABLA: H/H trending down 10.6-->9.3--> 8.4-->9.3.  Monitor for signs of bleeding. Increase iron supplement to bid.  Will order stool guaiacs. Recheck CBC in am.   7/30- Hb stable at 8.3- no changes- con't to monitor 10.  A fib with RVR: Monitor HR tid--continue Lopressor 50 mg bid with amiodarone 400 mg bid. Well controlled  7/30- rate ~ 100 but regular rhythm- per EKG- was checked 2x this AM  7/31- Troponins trending down- Cards doesn't see  A need to see her in f/u unless new Sx's occur.   8/3: rate controlled.  Hypocalcemia:  Ionized calcium 0.96.  Improved. Will add oral supplement.  7/30- Ca 8.1- will con't Ca supplements 11. Anxiety  disorder: Change Klonopin to PRN as it is oversedating her as per sister- she takes PRN at home  7/30- change klonopin to prn and 0.25 mg BID prn- needs some anxiety meds so will not d/c.  8/1- anxiety improved- still needsa lot of reassurance, but now about d/c date and how she's doing.  12. Dysphagia  7/30- will order SLP for swallowing due to difficulties taking protein supplement per RN and PT/OT this AM. Is ordered   8/3: swallowing improving.   LOS: 5 days A FACE TO FACE EVALUATION WAS PERFORMED  Clide Deutscher Kadisha Goodine 02/07/2020, 9:31 AM

## 2020-02-07 NOTE — Progress Notes (Signed)
Patient ID: [name], female   DOB: September 02, 1945, 74 y.o.   MRN: [id number]   SW spoke with [facility] [phone]) to discuss if pt remains active. She is a current patient. SW informed on pt d/c tomorrow. SW faxed HHPT/OT/SN (lab draw-protime on Friday) order to [facility] [phone] .  Loralee Pacas, MSW, [place] Office: [phone] Cell: [phone] Fax: [phone]

## 2020-02-07 NOTE — Progress Notes (Signed)
ANTICOAGULATION CONSULT NOTE - Follow Up Consult  Pharmacy Consult for Warfarin Indication: atrial fibrillation and s/p mitral valve reparir  Allergies  Allergen Reactions  . Buprenorphine Nausea Only and Other (See Comments)    sedation and adhesive reaction  . Contrast Media [Iodinated Diagnostic Agents] Hives  . Cymbalta [Duloxetine Hcl]     Severe diarrhea and upset stomach  . Erythromycin Nausea And Vomiting    Dizziness   . Hydrocodone     Sedation,dizziness, and nausea  . Hydromorphone     Sedation,dizziness, and nausea  . Latex      hives and sores  . Levofloxacin Nausea And Vomiting  . Metrizamide Hives  . Nsaids     CANNOT TAKE PER CARDIOLOGIST DUE TO AFIB   . Nucynta [Tapentadol Hcl]     nausea and sedation  . Other     CAN NOT TAKE , aLPRAZOLAM, OR ELAVIL DUE TO AFIB FOR ANXIETY  IV CONTRAST /"DYE".  . Oxycodone     Delusions (intolerance)  PILLS ONLY sedation, dizziness, nausea,  and itching  . Pentazocine     Unknown  . Septra [Bactrim]     Hives   . Sulfa Antibiotics Hives    rash  . Sulfasalazine Hives  . Adhesive [Tape] Rash and Other (See Comments)    Heart monitor stickers must be rotated in order to prevent rash  . Morphine Anxiety    sedation and nausea    Patient Measurements: Height: 5\' 6"  (167.6 cm) Weight: 54.5 kg (120 lb 2.4 oz) IBW/kg (Calculated) : 59.3  Vital Signs: Temp: 98.2 F (36.8 C) (08/03 0317) Temp Source: Oral (08/03 0317) BP: 111/54 (08/03 0317) Pulse Rate: 82 (08/03 0317)  Labs: Recent Labs    02/05/20 0532 02/06/20 0554 02/07/20 0549  HGB  --  9.7*  --   HCT  --  33.0*  --   PLT  --  355  --   LABPROT 18.8* 19.7* 20.1*  INR 1.6* 1.7* 1.8*  CREATININE  --  0.76  --     Estimated Creatinine Clearance: 52.3 mL/min (by C-G formula based on SCr of 0.76 mg/dL).  Assessment:  74 yr old female admitted to CIR from acute care; S/P minimally invasive mitral valve repair, complete MAZE procedure and sternotomy on  01/17/20. She was started on warfarin for mitral valve repair on 7/14, which was interrupted due to dark stools/FOBT positive. Warfarin was restarted at 2 mg daily on 7/22, and INR trended up to 5.6 on 7/28, for which pt received vitamin K. Follow up INR was 1.5 and she was resumed on warfarin 1 mg po daily (goal range 2.5-3.5, per Dr. Roxy Manns discharge summary). Pharmacy was consulted by Rehab Medicine to dose warfarin.  **Discussed with Erin Barrett, PA-C and confirmed target INR 2.5-3.5, but aiming for lower end. She states that they would not normally bridge with Lovenox, but let INR rise slowly to goal.  Discussed with P. Love, PA-C. Planning discharge on 8/4.    INR remains subtherapeutic (1.8), trending up slowly but little change from yesterday (INR 1.7) after decr dose to 1 mg. Amiodarone begun 7/23 and 400 mg TID planned until f/u in [facility] on 8/6 per prior EP plan.  Has had some sensitivity to warfarin doses and likely some influence due to Amiodarone. And also some resistance due to recent Vitamin K dose.   Goal of Therapy:  2.5-3.5, but aiming for lower end Monitor platelets by anticoagulation protocol: Yes   Plan:  Warfarin 2 mg x 1 today.  Daily PT/INR.  Monitor for s/sx bleeding.  Alanda Slim, PharmD, [facility] Clinical Pharmacist Please see AMION for all Pharmacists' Contact Phone Numbers 02/07/2020, 8:01 AM

## 2020-02-07 NOTE — Progress Notes (Signed)
Occupational Therapy Discharge Summary  Patient Details  Name: [name] MRN: [id number] Date of Birth: 1946/06/09  Patient has met 3 of 9 long term goals due to improved activity tolerance, improved balance and ability to compensate for deficits.  Pt made steady progress with BADLs and functional transfers.  Pt anxious to return home with family support.  Pt's sisters actively participated in therapy and are independent with providing the appropriate level of assistance. Family has provided assistance for pt over the years secondary to multiple back surgeries.Pt requires min A/CGA for bathing/dressing tasks and sit<>stand/functional transfers. Patient to discharge at overall supervision to min A level.  Patient's care partner is independent to provide the necessary physical assistance at discharge.    Reasons goals not met: Pt's goals set at mod I but pt an pt's family choose to leave prior to LOS set. They are available to provide the level of A needed by the pt.  Recommendation:  Patient will benefit from ongoing skilled OT services in home health setting to continue to advance functional skills in the area of BADL and Reduce care partner burden.  Equipment: BSC  Reasons for discharge: discharge from hospital  Patient/family agrees with progress made and goals achieved: Yes  OT Discharge Vision Baseline Vision/History: Wears glasses Wears Glasses: At all times Patient Visual Report: No change from baseline Vision Assessment?: No apparent visual deficits Perception  Perception: Within Functional Limits Praxis Praxis: Intact Cognition Overall Cognitive Status: Within Functional Limits for tasks assessed Arousal/Alertness: Awake/alert Orientation Level: Oriented X4 Attention: Focused;Sustained Focused Attention: Appears intact Sustained Attention: Appears intact Memory: Appears intact Awareness: Appears intact Problem Solving: Appears intact Safety/Judgment: Appears  intact Sensation Sensation Light Touch: Appears Intact Hot/Cold: Appears Intact Proprioception: Appears Intact Stereognosis: Not tested Coordination Gross Motor Movements are Fluid and Coordinated: No Fine Motor Movements are Fluid and Coordinated: Yes Coordination and Movement Description: impaired 2/2 pain and generalized weakness Motor  Motor Motor: Within Functional Limits Motor - Skilled Clinical Observations: generalized weakness Motor - Discharge Observations: generalized weakness Mobility  Bed Mobility Bed Mobility: Rolling Right;Rolling Left;Sit to Sidelying Right Rolling Right: Independent with assistive device Rolling Left: Independent with assistive device Sit to Sidelying Right: Independent with assistive device Transfers Sit to Stand: Minimal Assistance - Patient > 75%  Trunk/Postural Assessment  Cervical Assessment Cervical Assessment:  (limited ROM secondary prior surgeries) Thoracic Assessment Thoracic Assessment:  (limited rotation) Lumbar Assessment Lumbar Assessment: Within Functional Limits Postural Control Postural Control: Deficits on evaluation Righting Reactions: delayed  Balance Balance Balance Assessed: Yes Static Sitting Balance Static Sitting - Level of Assistance: 6: Modified independent (Device/Increase time) Dynamic Sitting Balance Dynamic Sitting - Level of Assistance: 6: Modified independent (Device/Increase time) Static Standing Balance Static Standing - Level of Assistance: 5: Stand by assistance Dynamic Standing Balance Dynamic Standing - Balance Support: Bilateral upper extremity supported;During functional activity Dynamic Standing - Level of Assistance: 5: Stand by assistance;4: Min assist Extremity/Trunk Assessment RUE Assessment RUE Assessment: Within Functional Limits LUE Assessment LUE Assessment: Within Functional Limits LUE Strength Left Hand Grip (lbs): 20   Leroy Libman 02/07/2020, 2:24 PM

## 2020-02-07 NOTE — Progress Notes (Signed)
Occupational Therapy Session Note  Patient Details  Name: SAMYIA MOTTER MRN: [id number] Date of Birth: 29-Apr-1946  Today's Date: 02/07/2020 OT Individual Time: 1345-1415 OT Individual Time Calculation (min): 30 min  and Today's Date: 02/07/2020 OT Missed Time: 15 Minutes Missed Time Reason: Patient fatigue   Short Term Goals: Week 1:  OT Short Term Goal 1 (Week 1): LTG=STG  Skilled Therapeutic Interventions/Progress Updates:    Pt resting in w/c upon arrival.  Pt's sisters present for education. Discussed assist levels, reviewed sternal precautions, DME needs, and home safety.  Pt's sisters independently cued pt regarding sternal precautions during sit<>stand. Pt's sisters provided appropriate level of assistance during transfers. Pt fatigued after a full day of therapy and requested to return to bed. W/c>bed transfer and sit>supine with min A. Pt remained in bed with all needs within reach and bed alarm activated.   Therapy Documentation Precautions:  Precautions Precautions: Sternal, Fall Precaution Comments: verbally reviewed precautions Restrictions Weight Bearing Restrictions: No Other Position/Activity Restrictions: Sternal precautions General: General OT Amount of Missed Time: 15 Minutes   Pain: Pt c/o 4/10 sternal pain; RN aware and repositioned, emotional support   Therapy/Group: Individual Therapy  Leroy Libman 02/07/2020, 2:17 PM

## 2020-02-07 NOTE — Telephone Encounter (Signed)
Received a phone call from inpatient rehab RN that patient will d/c tomorrow. She would like to be followed by our office for warfarin. Is a [name] pt. On DOAC prior to tissue mitral valve. On Amiodarone. Was supratherapeutic and received vit K on 7/28. INR today is 1.8. Patient will have home health set up. Will schedule patient for in office visit for Monday 8/9 for education, but all other visits should be able to be done via home health if she has INR goal 2.5-3.5 but aiming for lower end due to bleeding issues. No lovenox bridge per Erin Barrett.  Pt can be taken off of ASA once INR above 2 per note on 7/29 by Ellwood Handler.

## 2020-02-08 LAB — PROTIME-INR
INR: 2 — ABNORMAL HIGH (ref 0.8–1.2)
Prothrombin Time: 21.8 seconds — ABNORMAL HIGH (ref 11.4–15.2)

## 2020-02-08 MED ORDER — NYSTATIN 100000 UNIT/GM EX POWD: Freq: Two times a day (BID) | CUTANEOUS | 0 refills | Status: AC | PRN

## 2020-02-08 MED ORDER — GABAPENTIN 100 MG PO CAPS: 100.0000 mg | ORAL_CAPSULE | Freq: Three times a day (TID) | ORAL | 0 refills | Status: DC | PRN

## 2020-02-08 MED ORDER — PANTOPRAZOLE SODIUM 40 MG PO TBEC: 40.0000 mg | DELAYED_RELEASE_TABLET | Freq: Every day | ORAL | 0 refills | Status: DC

## 2020-02-08 MED ORDER — AMIODARONE HCL 400 MG PO TABS: 400.0000 mg | ORAL_TABLET | Freq: Three times a day (TID) | ORAL | 0 refills | Status: DC

## 2020-02-08 MED ORDER — METOPROLOL TARTRATE 50 MG PO TABS: 50.0000 mg | ORAL_TABLET | Freq: Two times a day (BID) | ORAL | 0 refills | Status: DC

## 2020-02-08 MED ORDER — OXYCODONE HCL 5 MG/5ML PO SOLN: 4.0000 mg | ORAL | 0 refills | Status: AC | PRN

## 2020-02-08 MED ORDER — WARFARIN SODIUM 1 MG PO TABS: ORAL_TABLET | ORAL | 0 refills | Status: DC

## 2020-02-08 MED ORDER — MELATONIN 3 MG PO TABS: 3.0000 mg | ORAL_TABLET | Freq: Every evening | ORAL | 0 refills | Status: DC | PRN

## 2020-02-08 MED ORDER — POTASSIUM CHLORIDE ER 10 MEQ PO TBCR: 20.0000 meq | EXTENDED_RELEASE_TABLET | Freq: Every day | ORAL | 0 refills | Status: DC

## 2020-02-08 MED ORDER — FE FUMARATE-B12-VIT C-FA-IFC PO CAPS: 1.0000 | ORAL_CAPSULE | Freq: Two times a day (BID) | ORAL | 0 refills | Status: DC

## 2020-02-08 MED ORDER — FUROSEMIDE 40 MG PO TABS: 40.0000 mg | ORAL_TABLET | Freq: Every day | ORAL | 0 refills | Status: DC

## 2020-02-08 MED ORDER — PROSOURCE PLUS PO LIQD: 30.0000 mL | Freq: Two times a day (BID) | ORAL | 0 refills | Status: DC

## 2020-02-08 MED ORDER — MAGNESIUM OXIDE 400 (241.3 MG) MG PO TABS: 200.0000 mg | ORAL_TABLET | Freq: Every day | ORAL | 0 refills | Status: DC

## 2020-02-08 NOTE — Plan of Care (Signed)
Problem: Consults Goal: RH GENERAL PATIENT EDUCATION Description: See Patient Education module for education specifics. 02/08/2020 1306 by Mikki Harbor, RN Outcome: Completed/Met 02/08/2020 1304 by Mikki Harbor, RN Outcome: Adequate for Discharge 02/08/2020 1303 by Mikki Harbor, RN Outcome: Progressing 02/08/2020 1149 by Mikki Harbor, RN Outcome: Adequate for Discharge Goal: Skin Care Protocol Initiated - if Braden Score 18 or less Description: If consults are not indicated, leave blank or document N/A 02/08/2020 1306 by Mikki Harbor, RN Outcome: Completed/Met 02/08/2020 1304 by Mikki Harbor, RN Outcome: Adequate for Discharge 02/08/2020 1303 by Mikki Harbor, RN Outcome: Progressing 02/08/2020 1149 by Mikki Harbor, RN Outcome: Adequate for Discharge Goal: Nutrition Consult-if indicated 02/08/2020 1306 by Mikki Harbor, RN Outcome: Completed/Met 02/08/2020 1304 by Mikki Harbor, RN Outcome: Adequate for Discharge 02/08/2020 1303 by Mikki Harbor, RN Outcome: Progressing 02/08/2020 1149 by Mikki Harbor, RN Outcome: Adequate for Discharge   Problem: RH BOWEL ELIMINATION Goal: RH STG MANAGE BOWEL WITH ASSISTANCE Description: STG Manage Bowel with moderate Assistance. 02/08/2020 1306 by Mikki Harbor, RN Outcome: Completed/Met 02/08/2020 1304 by Mikki Harbor, RN Outcome: Adequate for Discharge 02/08/2020 1303 by Mikki Harbor, RN Outcome: Progressing 02/08/2020 1149 by Mikki Harbor, RN Outcome: Adequate for Discharge Goal: RH STG MANAGE BOWEL W/MEDICATION W/ASSISTANCE Description: STG Manage Bowel with Medication with  moderate Assistance. 02/08/2020 1306 by Mikki Harbor, RN Outcome: Completed/Met 02/08/2020 1304 by Mikki Harbor, RN Outcome: Adequate for Discharge 02/08/2020 1303 by Mikki Harbor, RN Outcome: Progressing 02/08/2020 1149 by Mikki Harbor, RN Outcome: Adequate for Discharge    Problem: RH BLADDER ELIMINATION Goal: RH STG MANAGE BLADDER WITH ASSISTANCE Description: STG Manage Bladder With  moderate Assistance 02/08/2020 1306 by Mikki Harbor, RN Outcome: Completed/Met 02/08/2020 1304 by Mikki Harbor, RN Outcome: Adequate for Discharge 02/08/2020 1303 by Mikki Harbor, RN Outcome: Progressing 02/08/2020 1149 by Mikki Harbor, RN Outcome: Adequate for Discharge Goal: RH STG MANAGE BLADDER WITH MEDICATION WITH ASSISTANCE Description: STG Manage Bladder With Medication With Assistance. 02/08/2020 1306 by Mikki Harbor, RN Outcome: Completed/Met 02/08/2020 1304 by Mikki Harbor, RN Outcome: Adequate for Discharge 02/08/2020 1303 by Mikki Harbor, RN Outcome: Progressing 02/08/2020 1149 by Mikki Harbor, RN Outcome: Adequate for Discharge   Problem: RH SKIN INTEGRITY Goal: RH STG SKIN FREE OF INFECTION/BREAKDOWN Description: Skin free fronm skin infection entire stay on rehab 02/08/2020 1306 by Mikki Harbor, RN Outcome: Completed/Met 02/08/2020 1304 by Mikki Harbor, RN Outcome: Adequate for Discharge 02/08/2020 1303 by Mikki Harbor, RN Outcome: Progressing 02/08/2020 1149 by Mikki Harbor, RN Outcome: Adequate for Discharge Goal: RH STG ABLE TO PERFORM INCISION/WOUND CARE W/ASSISTANCE Description: STG Able To Perform Incision/Wound Care With Assistance. 02/08/2020 1306 by Mikki Harbor, RN Outcome: Completed/Met 02/08/2020 1304 by Mikki Harbor, RN Outcome: Adequate for Discharge 02/08/2020 1303 by Mikki Harbor, RN Outcome: Progressing 02/08/2020 1149 by Mikki Harbor, RN Outcome: Adequate for Discharge   Problem: RH SAFETY Goal: RH STG ADHERE TO SAFETY PRECAUTIONS W/ASSISTANCE/DEVICE Description: STG Adhere to Safety Precautions With mod  Assistance/Device. 02/08/2020 1306 by Mikki Harbor, RN Outcome: Completed/Met 02/08/2020 1304 by Mikki Harbor, RN Outcome: Adequate for  Discharge 02/08/2020 1303 by Mikki Harbor, RN Outcome: Progressing 02/08/2020 1149 by Mikki Harbor, RN Outcome: Adequate for Discharge   Problem: RH PAIN MANAGEMENT Goal: RH STG PAIN MANAGED AT OR BELOW PT'S PAIN GOAL  Description: Pain less than 2 02/08/2020 1306 by Mikki Harbor, RN Outcome: Completed/Met 02/08/2020 1304 by Mikki Harbor, RN Outcome: Adequate for Discharge 02/08/2020 1303 by Mikki Harbor, RN Outcome: Progressing 02/08/2020 1149 by Mikki Harbor, RN Outcome: Adequate for Discharge   Problem: RH KNOWLEDGE DEFICIT GENERAL Goal: RH STG INCREASE KNOWLEDGE OF SELF CARE AFTER HOSPITALIZATION Description: Moderate assistance 02/08/2020 1306 by Mikki Harbor, RN Outcome: Completed/Met 02/08/2020 1304 by Mikki Harbor, RN Outcome: Adequate for Discharge 02/08/2020 1303 by Mikki Harbor, RN Outcome: Progressing 02/08/2020 1149 by Mikki Harbor, RN Outcome: Adequate for Discharge

## 2020-02-08 NOTE — Discharge Summary (Signed)
Physician Discharge Summary  Patient ID: [name] MRN: [id number] DOB/AGE: [birth year] 75 y.o.  Admit date: 02/02/2020 Discharge date: 02/08/2020  Discharge Diagnoses:  Principal Problem:   Debility Active Problems:   Neuropathy   GERD (gastroesophageal reflux disease)   Anemia   Paroxysmal atrial fibrillation (HCC)   B12 deficiency   Anxiety and depression   Chronic pain   Acute blood loss anemia   Discharged Condition: stable  Significant Diagnostic Studies:   Labs:  Basic Metabolic Panel: Recent Labs  Lab 02/03/20 0702 02/06/20 0554  NA 138 139  K 4.6 4.4  CL 95* 96*  CO2 35* 34*  GLUCOSE 97 107*  BUN 11 12  CREATININE 0.71 0.76  CALCIUM 8.1* 8.9  MG 1.9  --     CBC: Recent Labs  Lab 02/03/20 0702 02/06/20 0554  WBC 8.0 6.8  NEUTROABS 6.9 5.5  HGB 8.3* 9.7*  HCT 27.7* 33.0*  MCV 87.9 88.0  PLT 260 355    CBG: No results for input(s): GLUCAP in the last 168 hours.  Brief HPI:   [name] is a 74 y.o. 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  She was admitted on 02/03/2020 for median sternotomy with minimally invasive MVR and maze procedure by Dr. Roxy Manns.  Postop course significant for issues with volume overload, acute blood loss anemia with heme positive stool as well as A. fib with RVR and flutter.   She continued to have problems with RVR and underwent DCCV by Dr. Davina Poke on 07/22 but went back into RVR on 07/23 therefore Tikosyn discontinued and she was loaded with amiodarone 400 mg 3 times daily.  Metoprolol also on board for rate control.  She continued to have limitations due to pleuritic chest pain, anxiety, hypoxia as well as debility.  CIR was recommended due to functional decline.   Hospital Course: [name] was admitted to rehab 02/02/2020 for inpatient therapies to consist of PT and OT at least three hours five days a  week. Past admission physiatrist, therapy team and rehab RN have worked together to provide customized collaborative inpatient rehab.  She was maintained on low-dose aspirin due to subtherapeutic INR of 1.5.  Pharmacy has been assisting with Coumadin management and INR slowly trended up to 2.0.  She was discharged on 2 mg Coumadin alternating with 1 mg and repeat INR to be drawn on 08/6 with results to [facility] Coumadin clinic.Her blood pressures have been monitored on TID basis and has been stable.  Weights were monitored daily have trended down to 120 lbs. She did receive  1 dose of IV Lasix due to peripheral edema with improvement.  Follow up CBC showed ABLA is resolving and no signs of bleeding noted. Sternal incision is C/D/I.  Serial check of electrolytes showed sodium and potassium levels to be within normal limits.  Acute blood loss anemia stable.  Initially she was noted to be highly anxious and her family felt that CIR was too intense after overnight stay.  EKG was done on a.m. of 805 and was negative for A. fib.  She did have some T wave changes but cardiology felt that this was not of significance as patient is symptomatic and recent cardiac cath without significant disease.  Team has provided ego support and her anxiety has improved.  Her activity tolerance and endurance have improved but her length of stay was  cut short due to her request.  She currently requires min assist and her sisters plan on providing assistance after discharge.  She will continue to receive follow-up HHPT, [place] [facility] [place] [facility].    Rehab course: During patient's stay in rehab weekly team conferences were held to monitor patient's progress, set goals and discuss barriers to discharge. At admission, patient required min assist with mobility and min to mod assist with ADL tasks.  She  has had improvement in activity tolerance, balance, postural control as well as ability to compensate for deficits.  She is able to  complete ADL tasks at supevision to min assist level.  She requires min assist with sit to stand transfers and contact-guard assist to ambulate 170 feet with rolling walker.  She requires verbal cues to maintain sternal precautions and for safety. Family education was completed regarding all aspects of safety and care.    Discharge disposition: 01-Home or Self Care  Diet: Heart Healthy  Special Instructions: 1. HHRN to draw protime on 08/05 with results to [facility] coumadin clinic 2. Low dose ASA to be discontinued once INR therapeutic. INR goal 2.5-3.5 range.  3. Family to assist patient with medication management.     Allergies as of 02/08/2020      Reactions   Buprenorphine Nausea Only, Other (See Comments)   sedation and adhesive reaction   Contrast Media [iodinated Diagnostic Agents] Hives   Cymbalta [duloxetine Hcl]    Severe diarrhea and upset stomach   Erythromycin Nausea And Vomiting   Dizziness    Hydrocodone    Sedation,dizziness, and nausea   Hydromorphone    Sedation,dizziness, and nausea   Latex     hives and sores   Levofloxacin Nausea And Vomiting   Metrizamide Hives   Nsaids    CANNOT TAKE PER CARDIOLOGIST DUE TO AFIB    Nucynta [tapentadol Hcl]    nausea and sedation   Other    CAN NOT TAKE , aLPRAZOLAM, OR ELAVIL DUE TO AFIB FOR ANXIETY IV CONTRAST /"DYE".   Oxycodone    Delusions (intolerance)  PILLS ONLY sedation, dizziness, nausea,  and itching   Pentazocine    Unknown   Septra [bactrim]    Hives    Sulfa Antibiotics Hives   rash   Sulfasalazine Hives   Adhesive [tape] Rash, Other (See Comments)   Heart monitor stickers must be rotated in order to prevent rash   Morphine Anxiety   sedation and nausea      Medication List    STOP taking these medications   Biotin 10 MG Caps   cholecalciferol 25 MCG (1000 UNIT) tablet Commonly known as: VITAMIN D3   Magnesium 250 MG Tabs Replaced by: magnesium oxide 400 (241.3 Mg) MG tablet   meclizine 25 MG  tablet Commonly known as: ANTIVERT     TAKE these medications   (feeding supplement) PROSource Plus liquid Take 30 mLs by mouth 2 (two) times daily between meals.   acetaminophen 500 MG tablet Commonly known as: TYLENOL Take 500 mg by mouth every 6 (six) hours as needed for moderate pain.   amiodarone 400 MG tablet Commonly known as: PACERONE Take 1 tablet (400 mg total) by mouth every 8 (eight) hours.   aspirin EC 81 MG tablet Take 1 tablet (81 mg total) by mouth daily. Swallow whole.   b complex vitamins tablet Take 1 tablet by mouth daily.   CALCIUM + D PO Take 1 tablet by mouth daily.  clonazePAM 0.5 MG tablet Commonly known as: KLONOPIN Take 0.5 mg by mouth daily as needed for anxiety.   Constulose 10 GM/15ML solution Generic drug: lactulose Take 20 g by mouth daily as needed for moderate constipation (constipation.).   cyclobenzaprine 10 MG tablet Commonly known as: FLEXERIL Take 10 mg by mouth at bedtime as needed for muscle spasms.   denosumab 60 MG/ML Sosy injection Commonly known as: PROLIA Inject 60 mg into the skin every 6 (six) months.   famotidine 20 MG tablet Commonly known as: PEPCID Take 1 tablet (20 mg total) by mouth daily. What changed:   when to take this  reasons to take this   ferrous [id number] C-folic acid capsule Commonly known as: TRINSICON / FOLTRIN Take 1 capsule by mouth 2 (two) times daily after a meal.   fluticasone 50 MCG/ACT nasal spray Commonly known as: FLONASE Place 1 spray into both nostrils daily as needed for allergies or rhinitis.   furosemide 40 MG tablet Commonly known as: LASIX Take 1 tablet (40 mg total) by mouth daily.   gabapentin 100 MG capsule Commonly known as: NEURONTIN Take 1 capsule (100 mg total) by mouth 3 (three) times daily as needed (pain.).   levocetirizine 5 MG tablet Commonly known as: XYZAL Take 5 mg by mouth daily as needed for allergies.   LUBRICATING EYE DROPS OP Place 1  drop into both eyes daily as needed (dry eyes).   magnesium oxide 400 (241.3 Mg) MG tablet Commonly known as: MAG-OX Take 0.5 tablets (200 mg total) by mouth daily. Replaces: Magnesium 250 MG Tabs   melatonin 3 MG Tabs tablet Take 1 tablet (3 mg total) by mouth at bedtime as needed (insomnia).   metoprolol tartrate 50 MG tablet Commonly known as: LOPRESSOR Take 1 tablet (50 mg total) by mouth 2 (two) times daily.   nystatin powder Commonly known as: MYCOSTATIN/NYSTOP Apply topically 2 (two) times daily as needed (skin irritation (under breasts)).   oxyCODONE 5 MG/5ML solution Commonly known as: ROXICODONE Take 4 mLs (4 mg total) by mouth every 4 (four) hours as needed for moderate pain. What changed: how much to take   pantoprazole 40 MG tablet Commonly known as: PROTONIX Take 1 tablet (40 mg total) by mouth daily.   polyethylene glycol 17 g packet Commonly known as: MIRALAX / GLYCOLAX Take 17 g by mouth daily as needed for mild constipation.   potassium chloride 10 MEQ tablet Commonly known as: KLOR-CON Take 2 tablets (20 mEq total) by mouth daily. What changed: how much to take   sodium chloride 0.65 % Soln nasal spray Commonly known as: OCEAN Place 1 spray into both nostrils as needed for congestion (nose bleeds).   TUMS CHEWY BITES PO Take 1 tablet by mouth daily as needed (reflux).   valACYclovir 500 MG tablet Commonly known as: VALTREX Take 500 mg by mouth daily as needed (breakouts).   warfarin 1 MG tablet Commonly known as: COUMADIN Take as directed. If you are unsure how to take this medication, talk to your nurse or doctor. Original instructions: Use two pills with supper today.Take one pill with supper tomorrow. [facility] PharD to call you with dose on Friday after lab draw. What changed:   how much to take  how to take this  when to take this  additional instructions       Follow-up Information    Lovorn, Jinny Blossom, MD. Call.   Specialty:  Physical Medicine and Rehabilitation Why: as needed Contact information: [address] [place] [address] [phone]        Rexene Alberts, MD Follow up on 02/13/2020.   Specialty: Cardiothoracic Surgery Why: Appointment at 1 pm Contact information: [place] [address] [phone]        [facility] Follow up on 02/13/2020.   Why: Appointment with coumadin clinic at 2:30 pm Contact information: [address] [place] [address] [phone]       [name], MD. Call.   Specialty: Family Medicine Why: for post hospital follow up Contact information: [address] [place] [address] [phone]        Constance Haw, MD .   Specialty: Cardiology Contact information: [address] [place] [address] [phone]               Signed: Bary Leriche 02/09/2020, 4:37 PM

## 2020-02-08 NOTE — Progress Notes (Signed)
Pt iv removed. Education for discharge performed by PA. Pt assisted to pack all belongings and assisted to vehicle by staff.

## 2020-02-08 NOTE — Plan of Care (Signed)
  Problem: Consults Goal: RH GENERAL PATIENT EDUCATION Description: See Patient Education module for education specifics. 02/08/2020 1303 by Mikki Harbor, RN Outcome: Progressing 02/08/2020 1149 by Mikki Harbor, RN Outcome: Adequate for Discharge Goal: Skin Care Protocol Initiated - if Braden Score 18 or less Description: If consults are not indicated, leave blank or document N/A 02/08/2020 1303 by Mikki Harbor, RN Outcome: Progressing 02/08/2020 1149 by Mikki Harbor, RN Outcome: Adequate for Discharge Goal: Nutrition Consult-if indicated 02/08/2020 1303 by Mikki Harbor, RN Outcome: Progressing 02/08/2020 1149 by Mikki Harbor, RN Outcome: Adequate for Discharge   Problem: RH BOWEL ELIMINATION Goal: RH STG MANAGE BOWEL WITH ASSISTANCE Description: STG Manage Bowel with moderate Assistance. 02/08/2020 1303 by Mikki Harbor, RN Outcome: Progressing 02/08/2020 1149 by Mikki Harbor, RN Outcome: Adequate for Discharge Goal: RH STG MANAGE BOWEL W/MEDICATION W/ASSISTANCE Description: STG Manage Bowel with Medication with  moderate Assistance. 02/08/2020 1303 by Mikki Harbor, RN Outcome: Progressing 02/08/2020 1149 by Mikki Harbor, RN Outcome: Adequate for Discharge   Problem: RH BLADDER ELIMINATION Goal: RH STG MANAGE BLADDER WITH ASSISTANCE Description: STG Manage Bladder With  moderate Assistance 02/08/2020 1303 by Mikki Harbor, RN Outcome: Progressing 02/08/2020 1149 by Mikki Harbor, RN Outcome: Adequate for Discharge Goal: RH STG MANAGE BLADDER WITH MEDICATION WITH ASSISTANCE Description: STG Manage Bladder With Medication With Assistance. 02/08/2020 1303 by Mikki Harbor, RN Outcome: Progressing 02/08/2020 1149 by Mikki Harbor, RN Outcome: Adequate for Discharge   Problem: RH SKIN INTEGRITY Goal: RH STG SKIN FREE OF INFECTION/BREAKDOWN Description: Skin free fronm skin infection entire stay on rehab 02/08/2020 1303 by  Mikki Harbor, RN Outcome: Progressing 02/08/2020 1149 by Mikki Harbor, RN Outcome: Adequate for Discharge Goal: RH STG ABLE TO PERFORM INCISION/WOUND CARE W/ASSISTANCE Description: STG Able To Perform Incision/Wound Care With Assistance. 02/08/2020 1303 by Mikki Harbor, RN Outcome: Progressing 02/08/2020 1149 by Mikki Harbor, RN Outcome: Adequate for Discharge   Problem: RH SAFETY Goal: RH STG ADHERE TO SAFETY PRECAUTIONS W/ASSISTANCE/DEVICE Description: STG Adhere to Safety Precautions With mod  Assistance/Device. 02/08/2020 1303 by Mikki Harbor, RN Outcome: Progressing 02/08/2020 1149 by Mikki Harbor, RN Outcome: Adequate for Discharge   Problem: RH PAIN MANAGEMENT Goal: RH STG PAIN MANAGED AT OR BELOW PT'S PAIN GOAL Description: Pain less than 2 02/08/2020 1303 by Mikki Harbor, RN Outcome: Progressing 02/08/2020 1149 by Mikki Harbor, RN Outcome: Adequate for Discharge   Problem: RH KNOWLEDGE DEFICIT GENERAL Goal: RH STG INCREASE KNOWLEDGE OF SELF CARE AFTER HOSPITALIZATION Description: Moderate assistance 02/08/2020 1303 by Mikki Harbor, RN Outcome: Progressing 02/08/2020 1149 by Mikki Harbor, RN Outcome: Adequate for Discharge

## 2020-02-08 NOTE — Progress Notes (Signed)
[facility] PHYSICAL MEDICINE & REHABILITATION PROGRESS NOTE   Subjective/Complaints: DC home today INR is 2.0- will f/u with Coumadin clinic. Discussed plan with patient.  Swelling decreased- will continue on Lasix daily at home. Constipation better controlled with Miralax.  ROS: Pt denies SOB, abd pain, CP, N/V/C/D, and vision changes  Objective:   No results found. Recent Labs    02/06/20 0554  WBC 6.8  HGB 9.7*  HCT 33.0*  PLT 355   Recent Labs    02/06/20 0554  NA 139  K 4.4  CL 96*  CO2 34*  GLUCOSE 107*  BUN 12  CREATININE 0.76  CALCIUM 8.9    Intake/Output Summary (Last 24 hours) at 02/08/2020 0842 Last data filed at 02/08/2020 0743 Gross per 24 hour  Intake 560 ml  Output 500 ml  Net 60 ml     Physical Exam: Vital Signs Blood pressure 127/64, pulse 82, temperature 97.8 F (36.6 C), temperature source Oral, resp. rate 19, height 5\' 6"  (1.676 m), weight 54.5 kg, SpO2 95 %. General: Alert and oriented x 3, No apparent distress HEENT: Head is normocephalic, atraumatic, PERRLA, EOMI, sclera anicteric, oral mucosa pink and moist, dentition intact, ext ear canals clear,  Neck: Supple without JVD or lymphadenopathy Heart: Reg rate and rhythm. No murmurs rubs or gallops Chest: CTA bilaterally without wheezes, rales, or rhonchi; no distress Abdomen: Soft, non-tender, non-distended, bowel sounds positive. Extremities: No clubbing, cyanosis, or edema. Pulses are 2+ Extremities:Edema improved Skin: Sternal incision C/D/I- looks good- healing well Neuro: Pt is cognitively appropriate with normal insight, memory, and awareness. Cranial nerves 2-12 are intact. Sensory exam is normal. Reflexes are 2+ in all 4's. Fine motor coordination is intact. No tremors. Motor function is grossly 4/5.  Musculoskeletal: Full ROM, No pain with AROM or PROM in the neck, trunk, or extremities. Posture appropriate Psych: Pt's affect is appropriate. Pt is  cooperative   Assessment/Plan: 1. Functional deficits secondary to debility due to MAZE procedure which require 3+ hours per day of interdisciplinary therapy in a comprehensive inpatient rehab setting.  Physiatrist is providing close team supervision and 24 hour management of active medical problems listed below.  Physiatrist and rehab team continue to assess barriers to discharge/monitor patient progress toward functional and medical goals  Care Tool:  Bathing    Body parts bathed by patient: Right arm, Left arm, Chest, Abdomen, Front perineal area, Buttocks, Right upper leg, Left upper leg, Face   Body parts bathed by helper: Right lower leg, Left lower leg     Bathing assist Assist Level: Minimal Assistance - Patient > 75%     Upper Body Dressing/Undressing Upper body dressing   What is the patient wearing?: Pull over shirt    Upper body assist Assist Level: Supervision/Verbal cueing    Lower Body Dressing/Undressing Lower body dressing      What is the patient wearing?: Pants, Underwear/pull up     Lower body assist Assist for lower body dressing: Contact Guard/Touching assist     Toileting Toileting    Toileting assist Assist for toileting: Supervision/Verbal cueing     Transfers Chair/bed transfer  Transfers assist  Chair/bed transfer activity did not occur: Safety/medical concerns  Chair/bed transfer assist level: Contact Guard/Touching assist Chair/bed transfer assistive device: Programmer, multimedia   Ambulation assist      Assist level: Contact Guard/Touching assist Assistive device: Walker-rolling Max distance: 170'   Walk 10 feet activity   Assist     Assist  level: Contact Guard/Touching assist Assistive device: Walker-rolling   Walk 50 feet activity   Assist Walk 50 feet with 2 turns activity did not occur: Safety/medical concerns (no out of room per PA)  Assist level: Contact Guard/Touching assist Assistive  device: Walker-rolling    Walk 150 feet activity   Assist Walk 150 feet activity did not occur: Safety/medical concerns (no out of room per PA.)  Assist level: Contact Guard/Touching assist Assistive device: Walker-rolling    Walk 10 feet on uneven surface  activity   Assist Walk 10 feet on uneven surfaces activity did not occur: Safety/medical concerns         Wheelchair     Assist Will patient use wheelchair at discharge?: No Type of Wheelchair: Manual Wheelchair activity did not occur: Safety/medical concerns         Wheelchair 50 feet with 2 turns activity    Assist    Wheelchair 50 feet with 2 turns activity did not occur: Safety/medical concerns       Wheelchair 150 feet activity     Assist  Wheelchair 150 feet activity did not occur: Safety/medical concerns       Blood pressure 127/64, pulse 82, temperature 97.8 F (36.6 C), temperature source Oral, resp. rate 19, height 5\' 6"  (1.676 m), weight 54.5 kg, SpO2 95 %.  Medical Problem List and Plan: 1.  Impaired mobility and ADLs secondary to debility s/p mitral valve repair and Maze procedure             -patient may shower but incision must be covered  DC today              -ELOS/Goals: 5-7 days modI 2.  PAF/MVR/Antithrombotics: -DVT/anticoagulation:  Pharmaceutical: Now on Coumadin--INR subtherapeutic 5.6-->1.5 after Vitamin K on 7/28. Pharmacy to assist with management--INR goal 2-2.5  8/3: INR is 2.0             -antiplatelet therapy: N/A 3. Chronic back pain/Pain Management: Fentanyl 25 mcg/hr with Oxycodone prn. Flexeril prn for spasms. Will schedule gabapentin tid instead of prn. Well controlled 4. Mood: Team to provide ego support.              -antipsychotic agents: N/a 5. Neuropsych: This patient is intermitently capable of making decisions on her own behalf. 6. Skin/Wound Care: Routine pressure relief measures. Encourage protein supplements.  7. Fluids/Electrolytes/Nutrition:  Monitor I/O. Check lytes in am.  8. Fluid overload: On Lasix daily--add low dose potassium to prevent intermittent hypokalemia.  Monitor for signs of overload, HH diet and check daily weights.    Filed Weights   02/05/20 0330 02/06/20 0408 02/07/20 0500  Weight: 59.1 kg 58.4 kg 54.5 kg   8/2: weight downtrending. Can give IV lasix today given higher BP  8/3: down 4kg! Edema much improved 9. ABLA: H/H trending down 10.6-->9.3--> 8.4-->9.3.  Monitor for signs of bleeding. Increase iron supplement to bid.  Will order stool guaiacs. Recheck CBC in am.   7/30- Hb stable at 8.3- no changes- con't to monitor 10.  A fib with RVR: Monitor HR tid--continue Lopressor 50 mg bid with amiodarone 400 mg bid. Well controlled  7/30- rate ~ 100 but regular rhythm- per EKG- was checked 2x this AM  7/31- Troponins trending down- Cards doesn't see  A need to see her in f/u unless new Sx's occur.   8/4: rate controlled Hypocalcemia:  Ionized calcium 0.96.  Improved. Will add oral supplement.  7/30- Ca 8.1- will con't Ca supplements 11.  Anxiety disorder: Change Klonopin to PRN as it is oversedating her as per sister- she takes PRN at home  7/30- change klonopin to prn and 0.25 mg BID prn- needs some anxiety meds so will not d/c.  8/1- anxiety improved- still needsa lot of reassurance, but now about d/c date and how she's doing.  12. Dysphagia  7/30- will order SLP for swallowing due to difficulties taking protein supplement per RN and PT/OT this AM. Is ordered   8/3: swallowing improving.  13. Constipation: improving with Miralax.   >30 minutes spent in discharge of patient including review of medications and follow-up appointments, physical examination, discussed Lasix and decreased swelling, shortness of breath, examined incision which is healing well, discussed plan for coumadin clinic, and in answering all patient's questions  LOS: 6 days A FACE TO [place] 02/08/2020, 8:42 AM

## 2020-02-08 NOTE — Discharge Instructions (Signed)
Inpatient Rehab Discharge Instructions  [name] Discharge date and time:  02/08/20  Activities/Precautions/ Functional Status: Activity: No lifting, driving, or strenuous exercise till cleared by MD. Continue sternal precautions.  Diet: cardiac diet Wound Care: keep wound clean and dry.  Contact Dr. Roxy Manns if you develop any problems with your incision/wound--redness, swelling, increase in pain, drainage or if you develop fever or chills.    Functional status:  ___ No restrictions     ___ Walk up steps independently _X__ 24/7 supervision/assistance   ___ Walk up steps with assistance ___ Intermittent supervision/assistance  ___ Bathe/dress independently ___ Walk with walker     ___ Bathe/dress with assistance ___ Walk Independently    ___ Shower independently ___ Walk with assistance    _X__ Shower with assistance _X__ No alcohol     ___ Return to work/school ________  COMMUNITY REFERRALS UPON DISCHARGE:    Home Health:   PT     OT      RN                  Agency: [facility] Phone: [phone] *Please expect follow-up within 2-3 days to schedule your home visit.If you have not received follow-up, be sure to contact the branch directly.*    Special Instructions: 1. Home Health RN to draw protime on Friday with results to [facility] 2. Continue Sternal precautions till cleared by Dr. Ricard Dillon.  3. Family needs to assist with medication management.   My questions have been answered and I understand these instructions. I will adhere to these goals and the provided educational materials after my discharge from the hospital.  Patient/Caregiver Signature _______________________________ Date __________  Clinician Signature _______________________________________ Date __________  Please bring this form and your medication list with you to all your follow-up doctor's appointments.

## 2020-02-08 NOTE — Progress Notes (Signed)
Inpatient Rehabilitation Care Coordinator  Discharge Note  The overall goal for the admission was met for:   Discharge location: Yes. D/c to home with support from her sisters who will alternate care.   Length of Stay: Yes. 5 days.   Discharge activity level: Yes. Min A.   Home/community participation: Yes. Limited.  Services provided included: MD, RD, PT, OT, SLP, RN, CM, TR, Pharmacy, Neuropsych and SW  Financial Services: Medicare  Follow-up services arranged: Home Health: [facility] for PT/OT/SN (currently active) and Patient/Family request agency HH: [facility], DME: N/a  Comments (or additional information): contact pt [phone] or sister [name] [phone]  Patient/Family verbalized understanding of follow-up arrangements: Yes  Individual responsible for coordination of the follow-up plan: Pt will have assistance with coordinating care needs.   Confirmed correct DME delivered: Rana Snare 02/08/2020    Rana Snare

## 2020-02-08 NOTE — Plan of Care (Signed)
  Problem: Consults Goal: RH GENERAL PATIENT EDUCATION Description: See Patient Education module for education specifics. Outcome: Adequate for Discharge Goal: Skin Care Protocol Initiated - if Braden Score 18 or less Description: If consults are not indicated, leave blank or document N/A Outcome: Adequate for Discharge Goal: Nutrition Consult-if indicated Outcome: Adequate for Discharge   Problem: RH BOWEL ELIMINATION Goal: RH STG MANAGE BOWEL WITH ASSISTANCE Description: STG Manage Bowel with moderate Assistance. Outcome: Adequate for Discharge Goal: RH STG MANAGE BOWEL W/MEDICATION W/ASSISTANCE Description: STG Manage Bowel with Medication with  moderate Assistance. Outcome: Adequate for Discharge   Problem: RH BLADDER ELIMINATION Goal: RH STG MANAGE BLADDER WITH ASSISTANCE Description: STG Manage Bladder With  moderate Assistance Outcome: Adequate for Discharge Goal: RH STG MANAGE BLADDER WITH MEDICATION WITH ASSISTANCE Description: STG Manage Bladder With Medication With Assistance. Outcome: Adequate for Discharge   Problem: RH SKIN INTEGRITY Goal: RH STG SKIN FREE OF INFECTION/BREAKDOWN Description: Skin free fronm skin infection entire stay on rehab Outcome: Adequate for Discharge Goal: RH STG ABLE TO PERFORM INCISION/WOUND CARE W/ASSISTANCE Description: STG Able To Perform Incision/Wound Care With Assistance. Outcome: Adequate for Discharge   Problem: RH SAFETY Goal: RH STG ADHERE TO SAFETY PRECAUTIONS W/ASSISTANCE/DEVICE Description: STG Adhere to Safety Precautions With mod  Assistance/Device. Outcome: Adequate for Discharge   Problem: RH PAIN MANAGEMENT Goal: RH STG PAIN MANAGED AT OR BELOW PT'S PAIN GOAL Description: Pain less than 2 Outcome: Adequate for Discharge   Problem: RH KNOWLEDGE DEFICIT GENERAL Goal: RH STG INCREASE KNOWLEDGE OF SELF CARE AFTER HOSPITALIZATION Description: Moderate assistance Outcome: Adequate for Discharge

## 2020-02-09 DIAGNOSIS — D62 Acute posthemorrhagic anemia: Secondary | ICD-10-CM

## 2020-02-09 DIAGNOSIS — I4891 Unspecified atrial fibrillation: Secondary | ICD-10-CM | POA: Insufficient documentation

## 2020-02-09 DIAGNOSIS — Z7901 Long term (current) use of anticoagulants: Secondary | ICD-10-CM | POA: Insufficient documentation

## 2020-02-09 NOTE — Telephone Encounter (Signed)
Barrett, Lodema Hong, PA-C  Burnetta Kohls, Harlon Flor, RPH-CPP Jalina Blowers,   I clarified with physician last week that INR range is 2.5-3.5, with ideal goal at 3. However this patient is extremely frail, and is okay to be kept closer to 2.5   Thanks   Erin Barrett

## 2020-02-09 NOTE — Telephone Encounter (Signed)
Message also sent to Erin Barrett to clarify INR range as discharge summary mentioned 2.5-3.5, however mitral valve repair INR range is typically 2-3. Looks as though inpatient pharmacy team  Inquired about this as well but I cannot find documentation with follow up clarification.

## 2020-02-09 NOTE — Telephone Encounter (Signed)
Clint- are you going to have this lady's INR checked at the lab tomorrow? There is mention in the discharge paperwork that INR was going to be checked at your appointment

## 2020-02-10 ENCOUNTER — Telehealth: Payer: Self-pay | Admitting: Medical

## 2020-02-10 ENCOUNTER — Telehealth: Payer: Self-pay | Admitting: Cardiology

## 2020-02-10 ENCOUNTER — Other Ambulatory Visit: Payer: Self-pay | Admitting: Thoracic Surgery (Cardiothoracic Vascular Surgery)

## 2020-02-10 ENCOUNTER — Encounter (HOSPITAL_COMMUNITY): Payer: Medicare Other | Admitting: Physician Assistant

## 2020-02-10 ENCOUNTER — Other Ambulatory Visit: Payer: Self-pay

## 2020-02-10 DIAGNOSIS — Z5181 Encounter for therapeutic drug level monitoring: Secondary | ICD-10-CM | POA: Diagnosis not present

## 2020-02-10 DIAGNOSIS — Z79891 Long term (current) use of opiate analgesic: Secondary | ICD-10-CM | POA: Diagnosis not present

## 2020-02-10 DIAGNOSIS — Z7951 Long term (current) use of inhaled steroids: Secondary | ICD-10-CM | POA: Diagnosis not present

## 2020-02-10 DIAGNOSIS — M1990 Unspecified osteoarthritis, unspecified site: Secondary | ICD-10-CM | POA: Diagnosis not present

## 2020-02-10 DIAGNOSIS — I480 Paroxysmal atrial fibrillation: Secondary | ICD-10-CM | POA: Diagnosis not present

## 2020-02-10 DIAGNOSIS — I051 Rheumatic mitral insufficiency: Secondary | ICD-10-CM | POA: Diagnosis not present

## 2020-02-10 DIAGNOSIS — Z952 Presence of prosthetic heart valve: Secondary | ICD-10-CM | POA: Diagnosis not present

## 2020-02-10 DIAGNOSIS — M8580 Other specified disorders of bone density and structure, unspecified site: Secondary | ICD-10-CM | POA: Diagnosis not present

## 2020-02-10 DIAGNOSIS — Z7901 Long term (current) use of anticoagulants: Secondary | ICD-10-CM | POA: Diagnosis not present

## 2020-02-10 DIAGNOSIS — F329 Major depressive disorder, single episode, unspecified: Secondary | ICD-10-CM | POA: Diagnosis not present

## 2020-02-10 DIAGNOSIS — G8929 Other chronic pain: Secondary | ICD-10-CM | POA: Diagnosis not present

## 2020-02-10 DIAGNOSIS — E538 Deficiency of other specified B group vitamins: Secondary | ICD-10-CM | POA: Diagnosis not present

## 2020-02-10 DIAGNOSIS — D62 Acute posthemorrhagic anemia: Secondary | ICD-10-CM | POA: Diagnosis not present

## 2020-02-10 DIAGNOSIS — E785 Hyperlipidemia, unspecified: Secondary | ICD-10-CM | POA: Diagnosis not present

## 2020-02-10 DIAGNOSIS — K219 Gastro-esophageal reflux disease without esophagitis: Secondary | ICD-10-CM | POA: Diagnosis not present

## 2020-02-10 DIAGNOSIS — Z9181 History of falling: Secondary | ICD-10-CM | POA: Diagnosis not present

## 2020-02-10 DIAGNOSIS — G629 Polyneuropathy, unspecified: Secondary | ICD-10-CM | POA: Diagnosis not present

## 2020-02-10 DIAGNOSIS — J309 Allergic rhinitis, unspecified: Secondary | ICD-10-CM | POA: Diagnosis not present

## 2020-02-10 DIAGNOSIS — K5900 Constipation, unspecified: Secondary | ICD-10-CM | POA: Diagnosis not present

## 2020-02-10 DIAGNOSIS — F419 Anxiety disorder, unspecified: Secondary | ICD-10-CM | POA: Diagnosis not present

## 2020-02-10 DIAGNOSIS — Z48812 Encounter for surgical aftercare following surgery on the circulatory system: Secondary | ICD-10-CM | POA: Diagnosis not present

## 2020-02-10 DIAGNOSIS — M4125 Other idiopathic scoliosis, thoracolumbar region: Secondary | ICD-10-CM | POA: Diagnosis not present

## 2020-02-10 DIAGNOSIS — H8110 Benign paroxysmal vertigo, unspecified ear: Secondary | ICD-10-CM | POA: Diagnosis not present

## 2020-02-10 DIAGNOSIS — M810 Age-related osteoporosis without current pathological fracture: Secondary | ICD-10-CM | POA: Diagnosis not present

## 2020-02-10 DIAGNOSIS — I4891 Unspecified atrial fibrillation: Secondary | ICD-10-CM | POA: Diagnosis not present

## 2020-02-10 DIAGNOSIS — Z9889 Other specified postprocedural states: Secondary | ICD-10-CM

## 2020-02-10 DIAGNOSIS — R1310 Dysphagia, unspecified: Secondary | ICD-10-CM | POA: Diagnosis not present

## 2020-02-10 NOTE — Telephone Encounter (Signed)
Returned call to pt, spoke with her and her sister. They stated home health RN checked INR this AM by venopuncture and had to send results to lab since POC machine wasn't working.  [facility] in [place]. They could not see result and had to call [facility] does not have results back since the lab was not marked stat. Office closes in 30 minutes and pt has another INR check scheduled on Monday, so we will not have any INR results to dose her over the weekend.  Called pt back who was upset as the RN told her she would have the labs run stat. She stated she would call [facility] which I agreed she should do. Advised her to take 2 tablets (2mg ) of warfarin today, tomorrow, and Sunday, and will recheck her INR on Monday. She verbalized understanding.

## 2020-02-10 NOTE — Telephone Encounter (Signed)
New message     Home health nurse drew blood this am to check pt coumadin level.  Patient is calling to get coumadin results and dosage for the weekend.  She has an appt on Monday with the coumadin clinic.  Please call before leaving today

## 2020-02-10 NOTE — Consult Note (Signed)
   [facility] CM Inpatient Consult   02/10/2020  [name] [birth year] [id number]   Post hospital follow up on referral from inpatient rehab on 02/08/20.  [name], RN BSN [facility] Liaison  [phone] business mobile phone Toll free office [phone]  Fax number: [phone] [email or web address]

## 2020-02-10 NOTE — Telephone Encounter (Signed)
I was called by [facility] nurse to inform me the patient needed an urgent refill for warfarin. Reported INR 2.8. I called the patient and spoke to her sister, [name], who said they did not need a refill, they had plenty of pills. She was wondering how to take them. I informed them according to today's note it's 1 mg daily. They voiced understanding.   Estefana Taylor Kathlen Mody, PA-C

## 2020-02-10 NOTE — Telephone Encounter (Signed)
Pt missed afib appt today. Will check INR on Monday at her Coumadin appt.

## 2020-02-11 ENCOUNTER — Telehealth: Payer: Self-pay | Admitting: Internal Medicine

## 2020-02-11 NOTE — Telephone Encounter (Signed)
Cardiology Moonlighter Note  Returned page from patient. States she is "feeling sick." Worried that this might be caused by all the new medications she is taking. She is taking a higher dose of metoprolol now than before her recent heart surgery. Now also taking amiodarone 400mg  q8h. Feeling very tired. So tired that "she can hardly hold her head up." Had her blood pressure taken yesterday by home health nurse. Thinks it was 80/60, but doesn't know this for sure. States her blood pressures were very low when she was discharged from hospital too.   Patient also reports dark black tarry stools. Thought this was from the iron supplement that she is taking. Patient taking warfarin. INR today was 2.8 on home monitor.   I recommended patient come to ED for evaluation. She needs to have her vital signs checked and her blood counts checked to make sure she is not having acute/occult GI bleeding. If her blood counts are normal and her blood pressure is reasonable, then her symptoms may be secondary to medications such as her metoprolol.   Patient agrees to come in for evaluation. Prefers to come to urgent care instead of ED. Will go to the [facility] urgent care closest to her house.   Marcie Mowers, MD Cardiology Fellow, PGY-8

## 2020-02-13 ENCOUNTER — Ambulatory Visit: Payer: Self-pay | Admitting: Neurology

## 2020-02-13 ENCOUNTER — Ambulatory Visit
Admission: RE | Admit: 2020-02-13 | Discharge: 2020-02-13 | Disposition: A | Discharge status: Home / Self Care | Payer: Medicare Other | Source: Ambulatory Visit | Attending: Thoracic Surgery (Cardiothoracic Vascular Surgery) | Admitting: Thoracic Surgery (Cardiothoracic Vascular Surgery)

## 2020-02-13 ENCOUNTER — Ambulatory Visit (INDEPENDENT_AMBULATORY_CARE_PROVIDER_SITE_OTHER): Payer: Self-pay | Admitting: Surgical

## 2020-02-13 ENCOUNTER — Other Ambulatory Visit: Payer: Self-pay

## 2020-02-13 ENCOUNTER — Other Ambulatory Visit: Payer: Self-pay | Admitting: *Deleted

## 2020-02-13 ENCOUNTER — Ambulatory Visit (INDEPENDENT_AMBULATORY_CARE_PROVIDER_SITE_OTHER): Payer: Medicare Other | Admitting: *Deleted

## 2020-02-13 VITALS — BP 98/51 | HR 55 | Temp 97.1°F | Resp 16 | Ht 66.0 in | Wt 125.6 lb

## 2020-02-13 DIAGNOSIS — J90 Pleural effusion, not elsewhere classified: Secondary | ICD-10-CM

## 2020-02-13 DIAGNOSIS — Z9889 Other specified postprocedural states: Secondary | ICD-10-CM

## 2020-02-13 DIAGNOSIS — Z8679 Personal history of other diseases of the circulatory system: Secondary | ICD-10-CM | POA: Diagnosis not present

## 2020-02-13 DIAGNOSIS — I4891 Unspecified atrial fibrillation: Secondary | ICD-10-CM

## 2020-02-13 DIAGNOSIS — Z5181 Encounter for therapeutic drug level monitoring: Secondary | ICD-10-CM | POA: Diagnosis not present

## 2020-02-13 DIAGNOSIS — Z7901 Long term (current) use of anticoagulants: Secondary | ICD-10-CM

## 2020-02-13 DIAGNOSIS — R0602 Shortness of breath: Secondary | ICD-10-CM | POA: Diagnosis not present

## 2020-02-13 LAB — POCT INR: INR: 3.9 — AB (ref 2.0–3.0)

## 2020-02-13 NOTE — Progress Notes (Signed)
Pt stated that she was being seen by [facility]. Called [facility] and spoke to Maudie Mercury gave verbal order for pt to have INR checked on 8/16 and to have results called to [phone].   Called and spoke to pt and her sister. Made her aware that home health should check INR on 8/16 and cancelled appointment for pt to have INR checked on 8/16

## 2020-02-13 NOTE — Patient Instructions (Addendum)
Description    Hold Warfarin today, then start taking 1 tablet daily except for 2 tablets on Monday, Wednesday and Fridays. Recheck INR in 1 week. Call coumadin clinic for any changes in medications or upcoming procedures. [phone]   A full discussion of the nature of anticoagulants has been carried out.  A benefit risk analysis has been presented to the patient, so that they understand the justification for choosing anticoagulation at this time. The need for frequent and regular monitoring, precise dosage adjustment and compliance is stressed.  Side effects of potential bleeding are discussed.  The patient should avoid any OTC items containing aspirin or ibuprofen, and should avoid great swings in general diet.  Avoid alcohol consumption.  Call if any signs of abnormal bleeding.

## 2020-02-13 NOTE — Patient Instructions (Signed)
Thoracentesis scheduled 

## 2020-02-13 NOTE — Progress Notes (Signed)
[address]       [place] [address]             [phone]      Sayre J Torbeck Hayward Medical Record #[id number] Date of Birth: 08/08/1945  Referring: Josue Hector, MD Primary Care: [name], MD Primary Cardiologist: No primary care provider on file.   Chief Complaint:   POST OP FOLLOW UP CARDIOTHORACIC SURGERY OPERATIVE NOTE  Date of Procedure:                            01/17/2020  Preoperative Diagnosis:         Severe Mitral Regurgitation  Recurrent Persistent Atrial Fibrillation  Postoperative Diagnosis:    Same  Procedure:        Minimally-invasive Mitral Valve Repair             Complex valvuloplasty including artificial Gore-tex neochord replacement x 12             Sorin Memo 4D Ring Annuloplasty (size 47mm, catalog #[id number], serial [id number])             Median sternotomy   Minimally-invasive Maze Procedure             Complete bilateral atrial lesion set using cryothermy and bipolar radiofrequency ablation             Clipping of Left Atrial Appendage (Atricure left atrial clip, size 45 mm)   Median Sternotomy for Repair of Left Ventricular Free-wall Laceration   Surgeon:        Valentina Gu. Roxy Manns, MD  Assistant:       Ellwood Handler, PA-C  Anesthesia:    Laurie Panda, MD    History of Present Illness:    Patient is a 74 year old female status post the above described procedure seen in the office today in routine postsurgical follow-up.  She continues to have multiple pain control complaints which is a chronic issue.  She is having some shortness of breath.  She also has some lower extremity edema.  She denies fevers or chills.  She does have some fairly persistent nausea and loose stools.  She notes that they are black in color, however she is on iron supplement.  She has had no specific difficulties with her incisions.  She did show some improvement in CIR however is having some difficulty with strength,  energy and mobilization.  Rhythm strip shows atrial fibrillation with a controlled ventricular response.      Past Medical History:  Diagnosis Date  . Anxiety   . Arthritis    "maybe in my back" (03/31/2018)  . Benign paroxysmal positional vertigo 06/08/2013  . Complication of anesthesia   . Fracture of multiple ribs 2015   "don't know from what; dx'd when I in hospital for 1st back OR" (03/31/2018)  . GERD (gastroesophageal reflux disease)   . Hair loss 04/12/2012  . Herpes   . History of blood transfusion    "twice; related to back OR" (03/31/2018)  . History of kidney stones   . Interstitial cystitis 11/06/2011  . 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 Mitral regurgitation   . Osteopenia 02/18/2012  . Osteoporosis   . PAF (paroxysmal atrial fibrillation) ([place]) 2012  . Peripheral neuropathy 11/06/2011  . PMDD (premenstrual dysphoric disorder)   . PONV (postoperative nausea and vomiting)    nausea, vomiting, hives  and dizziness   . 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/2020   Complete bilateral atrial lesion set using cryothermy and bipolar radiofrequency ablation with clipping of LA appendage via right mini-thoracotomy approach  . 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 Seasonal allergies   . Vaginal delivery    ONE NSVD  . 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 Packs/day: 0.10  . Years: 14.00  . Pack years: 1.40  . Types: Cigarettes  Smokeless Tobacco Never Used  Tobacco Comment   03/31/2018 "quit ~ 1980; someday smoker when I did smoke; never addicted"    Social History   Substance and Sexual Activity  Alcohol Use Never     Allergies  Allergen Reactions  . Buprenorphine Nausea Only and Other (See Comments)    sedation and adhesive reaction  . Contrast Media [Iodinated Diagnostic  Agents] Hives  . Cymbalta [Duloxetine Hcl]     Severe diarrhea and upset stomach  . Erythromycin Nausea And Vomiting    Dizziness   . Hydrocodone     Sedation,dizziness, and nausea  . Hydromorphone     Sedation,dizziness, and nausea  . Latex      hives and sores  . Levofloxacin Nausea And Vomiting  . Metrizamide Hives  . Nsaids     CANNOT TAKE PER CARDIOLOGIST DUE TO AFIB   . Nucynta [Tapentadol Hcl]     nausea and sedation  . Other     CAN NOT TAKE , aLPRAZOLAM, OR ELAVIL DUE TO AFIB FOR ANXIETY  IV CONTRAST /"DYE".  . Oxycodone     Delusions (intolerance)  PILLS ONLY sedation, dizziness, nausea,  and itching  . Pentazocine     Unknown  . Septra [Bactrim]     Hives   . Sulfa Antibiotics Hives    rash  . Sulfasalazine Hives  . Adhesive [Tape] Rash and Other (See Comments)    Heart monitor stickers must be rotated in order to prevent rash  . Morphine Anxiety    sedation and nausea    Current Outpatient Medications  Medication Sig Dispense Refill  . acetaminophen (TYLENOL) 500 MG tablet Take 500 mg by mouth every 6 (six) hours as needed for moderate pain.     [name] amiodarone (PACERONE) 400 MG tablet Take 1 tablet (400 mg total) by mouth every 8 (eight) hours. 90 tablet 0  . aspirin EC 81 MG tablet Take 1 tablet (81 mg total) by mouth daily. Swallow whole. 150 tablet 2  . b complex vitamins tablet Take 1 tablet by mouth daily.    . Calcium Carbonate Antacid (TUMS CHEWY BITES PO) Take 1 tablet by mouth daily as needed (reflux).     . Calcium Citrate-Vitamin D (CALCIUM + D PO) Take 1 tablet by mouth daily.    . Carboxymethylcellul-Glycerin (LUBRICATING EYE DROPS OP) Place 1 drop into both eyes daily as needed (dry eyes).    . clonazePAM (KLONOPIN) 0.5 MG tablet Take 0.5 mg by mouth daily as needed for anxiety.    . cyclobenzaprine (FLEXERIL) 10 MG tablet Take 10 mg by mouth at bedtime as needed for muscle spasms.   5  . denosumab (PROLIA) 60 MG/ML SOSY injection Inject 60 mg  into the skin every 6 (six) months.    . famotidine (PEPCID) 20 MG tablet Take 1 tablet (20 mg  total) by mouth daily. (Patient taking differently: Take 20 mg by mouth daily as needed for heartburn or indigestion. ) 90 tablet 1  . ferrous [id number] C-folic acid (TRINSICON / FOLTRIN) capsule Take 1 capsule by mouth 2 (two) times daily after a meal. 60 capsule 0  . fluticasone (FLONASE) 50 MCG/ACT nasal spray Place 1 spray into both nostrils daily as needed for allergies or rhinitis.    . furosemide (LASIX) 40 MG tablet Take 1 tablet (40 mg total) by mouth daily. 30 tablet 0  . gabapentin (NEURONTIN) 100 MG capsule Take 1 capsule (100 mg total) by mouth 3 (three) times daily as needed (pain.). 90 capsule 0  . lactulose (CONSTULOSE) 10 GM/15ML solution Take 20 g by mouth daily as needed for moderate constipation (constipation.).     [name] levocetirizine (XYZAL) 5 MG tablet Take 5 mg by mouth daily as needed for allergies.     . magnesium oxide (MAG-OX) 400 (241.3 Mg) MG tablet Take 0.5 tablets (200 mg total) by mouth daily. 30 tablet 0  . melatonin 3 MG TABS tablet Take 1 tablet (3 mg total) by mouth at bedtime as needed (insomnia). 30 tablet 0  . metoprolol tartrate (LOPRESSOR) 50 MG tablet Take 1 tablet (50 mg total) by mouth 2 (two) times daily. 60 tablet 0  . Nutritional Supplements (,FEEDING SUPPLEMENT, PROSOURCE PLUS) liquid Take 30 mLs by mouth 2 (two) times daily between meals. 887 mL 0  . nystatin (MYCOSTATIN/NYSTOP) powder Apply topically 2 (two) times daily as needed (skin irritation (under breasts)). 15 g 0  . oxyCODONE (ROXICODONE) 5 MG/5ML solution Take 4 mLs (4 mg total) by mouth every 4 (four) hours as needed for moderate pain.  0  . pantoprazole (PROTONIX) 40 MG tablet Take 1 tablet (40 mg total) by mouth daily. 30 tablet 0  . polyethylene glycol (MIRALAX / GLYCOLAX) packet Take 17 g by mouth daily as needed for mild constipation.    . potassium chloride (KLOR-CON) 10 MEQ tablet  Take 2 tablets (20 mEq total) by mouth daily. 60 tablet 0  . sodium chloride (OCEAN) 0.65 % SOLN nasal spray Place 1 spray into both nostrils as needed for congestion (nose bleeds).    . valACYclovir (VALTREX) 500 MG tablet Take 500 mg by mouth daily as needed (breakouts).     . warfarin (COUMADIN) 1 MG tablet Use two pills with supper today.Take one pill with supper tomorrow. [place] PharD to call you with dose on Friday after lab draw. 45 tablet 0   No current facility-administered medications for this visit.       Physical Exam: Ht 5\' 6"  (1.676 m)   BMI 19.39 kg/m   General appearance: alert, cooperative, fatigued and no distress Heart: irregularly irregular rhythm Lungs: Diminished in the right base Abdomen: soft, nontender, and nondistended Extremities: Notable for ankle and pedal edema Wound: Incisions well-healed without evidence of infection   Diagnostic Studies & Laboratory data:     Recent Radiology Findings:   No results found.    Recent Lab Findings: Lab Results  Component Value Date   WBC 6.8 02/06/2020   HGB 9.7 (L) 02/06/2020   HCT 33.0 (L) 02/06/2020   PLT 355 02/06/2020   GLUCOSE 107 (H) 02/06/2020   CHOL 237 (H) 09/23/2017   TRIG 101.0 09/23/2017   HDL 100.90 09/23/2017   LDLDIRECT 136.5 06/08/2013   LDLCALC 116 (H) 09/23/2017   ALT 20 02/03/2020   AST 27 02/03/2020   NA 139 02/06/2020  K 4.4 02/06/2020   CL 96 (L) 02/06/2020   CREATININE 0.76 02/06/2020   BUN 12 02/06/2020   CO2 34 (H) 02/06/2020   TSH 2.818 01/27/2020   INR 2.0 (H) 02/08/2020   HGBA1C 5.3 01/13/2020      Assessment / Plan: Ms. Ground is overall making slow but steady progress.  She is significantly chronically deconditioned which is playing a role in her overall recovery.  I did change some medications from at the time of her discharge from CIR.  We will change amiodarone from 400 mg 3 times daily to once daily.  I will stop her iron at this point.  Most recent hematocrit  was 33 and GI symptoms may be related.  I will also stop her magnesium oxide.  She will continue to get pain medications from her pain specialist.  She is scheduled to see EP today at 2:30 PM.  On chest x-ray she does have a moderate sized right pleural effusion and we will schedule a thoracentesis for this week.  We will see her again in 3 months.      Medication Changes: No orders of the defined types were placed in this encounter.     John Giovanni, PA-C 02/13/2020 1:24 PM

## 2020-02-13 NOTE — Telephone Encounter (Signed)
Cammie Mcgee, RPH  Floyed Masoud, Harlon Flor, RPH-CPP Confirmed with Roxy Manns  MV repair INR 2-3  Only Mechanical MVR 2.5-3.5 just fyi   Pt has INR check today, will target INR range 2-3 as we typically due to valve repair.

## 2020-02-14 ENCOUNTER — Ambulatory Visit (HOSPITAL_COMMUNITY): Admission: RE | Admit: 2020-02-14 | Payer: Medicare Other | Source: Ambulatory Visit

## 2020-02-15 ENCOUNTER — Other Ambulatory Visit (HOSPITAL_COMMUNITY)
Admission: RE | Admit: 2020-02-15 | Discharge: 2020-02-15 | Disposition: A | Discharge status: Home / Self Care | Payer: Medicare Other | Source: Ambulatory Visit | Attending: Thoracic Surgery (Cardiothoracic Vascular Surgery) | Admitting: Thoracic Surgery (Cardiothoracic Vascular Surgery)

## 2020-02-15 DIAGNOSIS — Z20822 Contact with and (suspected) exposure to covid-19: Secondary | ICD-10-CM | POA: Insufficient documentation

## 2020-02-15 DIAGNOSIS — Z01812 Encounter for preprocedural laboratory examination: Secondary | ICD-10-CM | POA: Insufficient documentation

## 2020-02-15 LAB — SARS CORONAVIRUS 2 (TAT 6-24 HRS): SARS Coronavirus 2: NEGATIVE

## 2020-02-15 NOTE — Telephone Encounter (Signed)
Decrease amiodarone to 400 mg BID.

## 2020-02-16 ENCOUNTER — Other Ambulatory Visit: Payer: Self-pay

## 2020-02-16 ENCOUNTER — Ambulatory Visit (HOSPITAL_COMMUNITY)
Admission: RE | Admit: 2020-02-16 | Discharge: 2020-02-16 | Disposition: A | Discharge status: Home / Self Care | Payer: Medicare Other | Source: Ambulatory Visit | Attending: Thoracic Surgery (Cardiothoracic Vascular Surgery) | Admitting: Thoracic Surgery (Cardiothoracic Vascular Surgery)

## 2020-02-16 ENCOUNTER — Other Ambulatory Visit: Payer: Self-pay | Admitting: Thoracic Surgery (Cardiothoracic Vascular Surgery)

## 2020-02-16 DIAGNOSIS — J90 Pleural effusion, not elsewhere classified: Secondary | ICD-10-CM | POA: Insufficient documentation

## 2020-02-16 HISTORY — PX: IR THORACENTESIS ASP PLEURAL SPACE W/IMG GUIDE: IMG5380

## 2020-02-16 MED ORDER — LIDOCAINE HCL 1 % IJ SOLN
INTRAMUSCULAR | Status: AC
  Filled 2020-02-16: qty 20

## 2020-02-16 MED ORDER — LIDOCAINE HCL (PF) 1 % IJ SOLN
INTRAMUSCULAR | Status: DC | PRN
  Administered 2020-02-16: 10 mL

## 2020-02-16 NOTE — Procedures (Signed)
PROCEDURE SUMMARY:  Successful US guided right thoracentesis. Yielded 450 of clear yellow fluid. Pt tolerated procedure well. No immediate complications.  CXR ordered; small loculated pneumothorax in the right lung base. Patient is asymptomatic and ok for discharge home per Dr. Earleen Newport. Patient states she feels like she can breathe better post-procedure. She also states the pain and discomfort she felt in her chest prior to the procedure has decreased. Patient knows to go to the ED if she experiences worsening pain or shortness of breath.   EBL < 5 mL  Theresa Duty, NP 02/16/2020 11:36 AM

## 2020-02-16 NOTE — Telephone Encounter (Signed)
Outreach made to Pt.  Pt's amiodarone was reduced to 400 mg daily on 02/13/20 by TCTS.  Per Pt she is taking amiodarone 400 mg daily.

## 2020-02-17 ENCOUNTER — Other Ambulatory Visit: Payer: Self-pay | Admitting: *Deleted

## 2020-02-17 DIAGNOSIS — I051 Rheumatic mitral insufficiency: Secondary | ICD-10-CM | POA: Diagnosis not present

## 2020-02-17 DIAGNOSIS — F329 Major depressive disorder, single episode, unspecified: Secondary | ICD-10-CM | POA: Diagnosis not present

## 2020-02-17 DIAGNOSIS — F419 Anxiety disorder, unspecified: Secondary | ICD-10-CM | POA: Diagnosis not present

## 2020-02-17 DIAGNOSIS — D62 Acute posthemorrhagic anemia: Secondary | ICD-10-CM | POA: Diagnosis not present

## 2020-02-17 DIAGNOSIS — Z48812 Encounter for surgical aftercare following surgery on the circulatory system: Secondary | ICD-10-CM | POA: Diagnosis not present

## 2020-02-17 DIAGNOSIS — I480 Paroxysmal atrial fibrillation: Secondary | ICD-10-CM | POA: Diagnosis not present

## 2020-02-17 NOTE — Patient Outreach (Signed)
[facility]) Care Management  02/17/2020  [name] 05/27/1946 [id number]  [facility] Telephone Assessment/Screen fo r post hospital complex care referral  Referral Date: 02/10/20 Referral Source:Victoria Doug Sou [facility] RN CM hospital liaison Referral Reason: Please assign to [facility] Coordinator for complex care and disease management follow up calls, medication questions/needs and assess for further needs Insurance: Next[employer] Medicare Last admission was 02/02/20 to 02/08/20  For debility  Neuropathy  Paroxysmal Atrial fibrillation (PAF)   Outreach attempt # 1 successful partially  Patient is able to verify HIPAA, name only today Reviewed and addressed referral to [facility] with patient  [name] informed [facility] RN CM was [facility] today She reports various changes of her medications She reports 2 admissions recently  She reports being tired from a paracentesis today and requested speaking further with [facility] RN CM another day She and [facility] RN CM agreed on a follow up outreach   Social: [name] is a 74 year old female  She has good support of her family to include her sister, [name]     Conditions: Past Medical History:  Diagnosis Date  . Anxiety   . Arthritis    "maybe in my back" (03/31/2018)  . Benign paroxysmal positional vertigo 06/08/2013  . Complication of anesthesia   . Fracture of multiple ribs 2015   "don't know from what; dx'd when I in hospital for 1st back OR" (03/31/2018)  . GERD (gastroesophageal reflux disease)   . Hair loss 04/12/2012  . Herpes   . History of blood transfusion    "twice; related to back OR" (03/31/2018)  . History of kidney stones   . Interstitial cystitis 11/06/2011  . 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 Mitral regurgitation   . Osteopenia 02/18/2012  . Osteoporosis   . PAF (paroxysmal atrial fibrillation) ([place]) 2012  . Peripheral neuropathy 11/06/2011  . PMDD (premenstrual dysphoric disorder)   . PONV  (postoperative nausea and vomiting)    nausea, vomiting, hives and dizziness   . 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/2020   Complete bilateral atrial lesion set using cryothermy and bipolar radiofrequency ablation with clipping of LA appendage via right mini-thoracotomy approach  . 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 Seasonal allergies   . Vaginal delivery    ONE NSVD  . 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 Patient gave verbal consent for services [facility] telephonic RN CM.   Plan: [facility] RN CM will follow up with the patient within 4 business days to attempt to complete [facility] screening for post hospital complex care services for possible care coordination/disease management needs  Lockie Bothun L. Lavina Hamman, RN, BSN, [place] Coordinator Office number [phone] Mobile number [phone]  Main [facility] number [phone] Fax number [phone]

## 2020-02-20 ENCOUNTER — Other Ambulatory Visit: Payer: Self-pay | Admitting: *Deleted

## 2020-02-20 ENCOUNTER — Ambulatory Visit (INDEPENDENT_AMBULATORY_CARE_PROVIDER_SITE_OTHER): Payer: Medicare Other | Admitting: Pharmacist

## 2020-02-20 DIAGNOSIS — I4891 Unspecified atrial fibrillation: Secondary | ICD-10-CM

## 2020-02-20 DIAGNOSIS — F329 Major depressive disorder, single episode, unspecified: Secondary | ICD-10-CM | POA: Diagnosis not present

## 2020-02-20 DIAGNOSIS — Z8679 Personal history of other diseases of the circulatory system: Secondary | ICD-10-CM

## 2020-02-20 DIAGNOSIS — Z48812 Encounter for surgical aftercare following surgery on the circulatory system: Secondary | ICD-10-CM | POA: Diagnosis not present

## 2020-02-20 DIAGNOSIS — Z9889 Other specified postprocedural states: Secondary | ICD-10-CM | POA: Diagnosis not present

## 2020-02-20 DIAGNOSIS — Z7901 Long term (current) use of anticoagulants: Secondary | ICD-10-CM | POA: Diagnosis not present

## 2020-02-20 DIAGNOSIS — D62 Acute posthemorrhagic anemia: Secondary | ICD-10-CM | POA: Diagnosis not present

## 2020-02-20 DIAGNOSIS — I480 Paroxysmal atrial fibrillation: Secondary | ICD-10-CM | POA: Diagnosis not present

## 2020-02-20 DIAGNOSIS — I051 Rheumatic mitral insufficiency: Secondary | ICD-10-CM | POA: Diagnosis not present

## 2020-02-20 DIAGNOSIS — F419 Anxiety disorder, unspecified: Secondary | ICD-10-CM | POA: Diagnosis not present

## 2020-02-20 LAB — POCT INR: INR: 1.9 — AB (ref 2.0–3.0)

## 2020-02-20 NOTE — Patient Outreach (Signed)
[facility]) Care Management  02/20/2020  [name] 1946-06-14 [id number]   [facility] Telephone Assessment/Screen for post hospital complex care referral  Referral Date: 02/10/20 Referral Source:Victoria Doug Sou [facility] RN CM hospital liaison Referral Reason: Please assign to [place] Coordinator for complex care and disease management follow up calls, medication questions/needs and assess for further needs Insurance: NextGen Medicare Last admission was 02/02/20 to 02/08/20  For debility  Neuropathy  Paroxysmal Atrial fibrillation (PAF)   Outreach attempt # 2 successful partially Patient is able to verify HIPAA, name only today Reviewed and addressed referral to [facility] patient-for complex care and disease management follow up calls, medication questions/needs and assess for further needs  Regina Hiltunen informed [facility] RN CM pt reports it is not a good time to complete screening as sh has had a lot of appointments  [facility] RN CM inquired about permission to outreach to her sister, [name] prn but Regina Treese informed [facility] RN CM she preferred for [facility] RN CM to speak with her [facility] RN CM attempted to inquire  About a better time/day to outreach to her She repeated she had been sick x 2  [facility] RN CM acknowledge her hospitalizations and attempt to assess for any transition of care coordination needs but was informed it was not a good time She and [facility] RN CM agreed she would write down [facility] RN CM number for her to complete follow up outreach Teach back method used to confirm Regina Speirs had the correct office number for [facility] RN CM   Social: [name] is a 74 year old female  She has good support of her family to include her sister, Eulogio Bear   Plan [facility] RN CM has made 2 attempts partially to attempt to complete Regina [facility] screening [facility] RN CM will pend Regina Meritt for an outreach within 4-7 business days and pending a possible return call from the patient  [place] L. Lavina Hamman, RN, BSN, [place] Coordinator Office number [phone] Mobile number [phone]  Main [facility] number [phone] Fax number [phone]

## 2020-02-21 ENCOUNTER — Telehealth: Payer: Self-pay | Admitting: Family Medicine

## 2020-02-21 NOTE — Telephone Encounter (Signed)
Called Nelson back and he was calling in regards to [facility] PT original orders given by the hospital.  Verbal ok given.

## 2020-02-21 NOTE — Telephone Encounter (Signed)
Agree w/ orders given

## 2020-02-21 NOTE — Telephone Encounter (Signed)
[facility] called requesting a verbal order for [name] for Home health continue service 2 x a week for 6 weeks, and then 1x a week for two weeks.

## 2020-02-23 DIAGNOSIS — I051 Rheumatic mitral insufficiency: Secondary | ICD-10-CM | POA: Diagnosis not present

## 2020-02-23 DIAGNOSIS — Z48812 Encounter for surgical aftercare following surgery on the circulatory system: Secondary | ICD-10-CM | POA: Diagnosis not present

## 2020-02-23 DIAGNOSIS — D62 Acute posthemorrhagic anemia: Secondary | ICD-10-CM | POA: Diagnosis not present

## 2020-02-23 DIAGNOSIS — F329 Major depressive disorder, single episode, unspecified: Secondary | ICD-10-CM | POA: Diagnosis not present

## 2020-02-23 DIAGNOSIS — F419 Anxiety disorder, unspecified: Secondary | ICD-10-CM | POA: Diagnosis not present

## 2020-02-23 DIAGNOSIS — I480 Paroxysmal atrial fibrillation: Secondary | ICD-10-CM | POA: Diagnosis not present

## 2020-02-24 ENCOUNTER — Telehealth: Payer: Self-pay

## 2020-02-24 NOTE — Telephone Encounter (Signed)
[name] from [facility] called requesting verbal orders for a leave in foam for pressure ulcer on buttocks. Is this ok?

## 2020-02-24 NOTE — Telephone Encounter (Signed)
Yes - thank you

## 2020-02-27 ENCOUNTER — Ambulatory Visit (INDEPENDENT_AMBULATORY_CARE_PROVIDER_SITE_OTHER): Payer: Medicare Other | Admitting: Cardiovascular Disease

## 2020-02-27 ENCOUNTER — Other Ambulatory Visit: Payer: Self-pay | Admitting: *Deleted

## 2020-02-27 ENCOUNTER — Other Ambulatory Visit: Payer: Self-pay

## 2020-02-27 DIAGNOSIS — Z5181 Encounter for therapeutic drug level monitoring: Secondary | ICD-10-CM | POA: Diagnosis not present

## 2020-02-27 DIAGNOSIS — F419 Anxiety disorder, unspecified: Secondary | ICD-10-CM | POA: Diagnosis not present

## 2020-02-27 DIAGNOSIS — I480 Paroxysmal atrial fibrillation: Secondary | ICD-10-CM | POA: Diagnosis not present

## 2020-02-27 DIAGNOSIS — D62 Acute posthemorrhagic anemia: Secondary | ICD-10-CM | POA: Diagnosis not present

## 2020-02-27 DIAGNOSIS — Z48812 Encounter for surgical aftercare following surgery on the circulatory system: Secondary | ICD-10-CM | POA: Diagnosis not present

## 2020-02-27 DIAGNOSIS — I051 Rheumatic mitral insufficiency: Secondary | ICD-10-CM | POA: Diagnosis not present

## 2020-02-27 DIAGNOSIS — F329 Major depressive disorder, single episode, unspecified: Secondary | ICD-10-CM | POA: Diagnosis not present

## 2020-02-27 LAB — POCT INR: INR: 1.7 — AB (ref 2.0–3.0)

## 2020-02-27 MED ORDER — WARFARIN SODIUM 1 MG PO TABS: ORAL_TABLET | ORAL | 0 refills | Status: DC

## 2020-02-27 NOTE — Telephone Encounter (Signed)
[name] has been notified.

## 2020-02-27 NOTE — Patient Instructions (Signed)
Description   Spoke with [name] instructed for pt to take 3 tablets today then start taking 2 tablets daily except for 1 tablet on Sundays, Tuesdays and Thursdays. Recheck INR in 1 week by [facility], Keep Ensure at 1 per day. Call coumadin clinic for any changes in medications or upcoming procedures. [phone]

## 2020-02-27 NOTE — Patient Outreach (Signed)
[place] [name]) Care Management  02/27/2020  [name] 1946/04/05 [id number]  [facility] Telephone Assessment/Screen forpost [name] complex carereferral  Referral Date:02/10/20 Referral Source:[name] [facility] RN CM [name] liaison Referral Reason:Please assign to [place] Coordinator for complex careanddisease management follow up calls, medication questions/needsandassess for further needs Insurance:NextGen Medicare Last admission was 02/02/20 to 02/08/20 For debility Neuropathy Paroxysmal Atrial fibrillation (PAF)   Outreach attempt #3 successful briefly  Patient is able to verify HIPAA, Reviewed and addressed referral to [facility] patient-for complex careanddisease management follow up calls, medication questions/needsandassess for further needs  She is able to inform [facility] RN CM that she has just laid down about 30 minutes prior to [place] outreach as the [facility]) Physical therapy (PT) and nurse have recently visited today She reports getting lots of calls during the day and is tired She requests quick calls for further follow up  [facility] RN CM discussed follow up outreach within the next 7 -14 business days  She was ordered at [facility] discharge [place] PT, OT and RN   Transition of care services noted to be completed by primary care MD office staff- Regina [name] Transition of Care will be completed by primary care provider office who will refer to [facility] care management if needed.  She confirms today she has all her DME, medications and denies medical and social concerns at this time She has not made follow up with [name] at this time but does not want assist from [place] at this time   [name] is Regina 74 year old divorced femalemale who lives alone She has good support of her family to include her sisters, [name] They assist with transportation to medical appointments and medication management She  requires min assist with sit to stand transfers and contact-guard assist to ambulate 170 feet with rolling walker  Conditions: Paroxysmal Atrial fibrillation-02/09/20, 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 01/17/20, neuropathy, fracture of multiple ribs, melanoma of skin, osteoporosis, age related nuclear cataract, anxiety disorder, cardiac arrhythmia, GERD (gastroesophageal reflux disease), hypokalemia, interstitial cystitis, major depressive disorder, chronic pain, scoliosis, genital herpes, herpetic vulvovaginitis, calculus of kidney  DME eyeglasses BP cuff rolling walker prosource plus  appointments 02/29/20 cardiology 03/01/20 Home health services  03/15/20 k [name] phys med 03/26/20 [name] orthopedic surgeon 04/16/20 neurology [name] 1108/21 [name] cardiothoracic 06/21/20 [name] orthopedic 02/07/21 [name] Ophthalmology  Plan [facility] RN CM will outreach within 7-14 business days to [name] for further complex care coordination and disease management MD involvement barriers letter sent  Pt encouraged to return Regina call to [facility] RN CM prn  Goals Addressed              This Visit's Progress     Patient Stated   .  Patient will be able to manage her atrial fibrillation at home (pt-stated)        [name] (see longitudinal plan of care for additional care plan information)   Current Barriers:  [name] Knowledge deficit related to basic atrial fibrillation pathophysiology and self care management . Cognitive Deficits  Case Manager Clinical Goal(s):  [name] Over the next 90 days, patient will verbalize understanding of atrial fibrillation Action Plan and when to call doctor . Over the next 45 days, patient will take all atrial fibrillation mediations as prescribed . Patient will verbalize two symptoms of atrial fibrillation  exacerbation (increase chest pain, irregular heart rate, dizziness, weakness, fainting, struggle to breathe,  confusion, symptoms of stroke) within the next 30 days.    Interventions:  . Basic overview and discussion of pathophysiology of atrial fibrillation reviewed  . Provided written education on heart healthy diet . Reviewed atrial fibrillation Action Plan in depth and provided written copy . Review use of anticoagulant  Patient Self Care Activities:  . Takes atrial fibrillation  Medications as prescribed . Verbalizes understanding of and follows atrial fibrillation  Action Plan . Adheres to low sodium diet  Initial goal documentation        [name] L. Lavina Hamman, RN, BSN, [place] Coordinator Office number [phone] Mobile number [phone]  Main [facility] number [phone] Fax number [phone]

## 2020-02-29 ENCOUNTER — Other Ambulatory Visit: Payer: Self-pay

## 2020-02-29 ENCOUNTER — Other Ambulatory Visit: Payer: Self-pay | Admitting: Nurse Practitioner

## 2020-02-29 ENCOUNTER — Ambulatory Visit (HOSPITAL_COMMUNITY): Payer: Medicare Other | Attending: Cardiology

## 2020-02-29 DIAGNOSIS — I340 Nonrheumatic mitral (valve) insufficiency: Secondary | ICD-10-CM | POA: Diagnosis not present

## 2020-02-29 DIAGNOSIS — Z9889 Other specified postprocedural states: Secondary | ICD-10-CM | POA: Diagnosis not present

## 2020-02-29 LAB — ECHOCARDIOGRAM COMPLETE
Area-P 1/2: 3.77 cm2
S' Lateral: 2.7 cm

## 2020-02-29 MED ORDER — PERFLUTREN LIPID MICROSPHERE
1.0000 mL | INTRAVENOUS | Status: AC | PRN
  Administered 2020-02-29: 1 mL via INTRAVENOUS

## 2020-03-01 ENCOUNTER — Telehealth: Payer: Self-pay | Admitting: Cardiology

## 2020-03-01 NOTE — Telephone Encounter (Signed)
*  STAT* If patient is at the pharmacy, call can be transferred to refill team.   1. Which medications need to be refilled? (please list name of each medication and dose if known)  metoprolol tartrate (LOPRESSOR) 50 MG tablet; pantoprazole (PROTONIX) 40 MG tablet; PACERONE 200 MG tablet; furosemide (LASIX) 40 MG tablet  2. Which pharmacy/location (including street and city if local pharmacy) is medication to be sent to? [place], [place]  3. Do they need a 30 day or 90 day supply? [place]

## 2020-03-01 NOTE — Telephone Encounter (Signed)
Pt was prescribed these medications in the hospital. Would [name] like to refill these medications? Please address

## 2020-03-02 ENCOUNTER — Other Ambulatory Visit: Payer: Self-pay | Admitting: Nurse Practitioner

## 2020-03-02 NOTE — Telephone Encounter (Signed)
Pt reports that she is "coming along slow" after valve surgery.  Continued issues with decreased stamina, using walker, a lot of leg weakness.  She is getting PT at home BID but improvement is little.  Advised pt to call and speak w/ TCTS to further discuss.  She is agreeable.  The refills she needs is for (states that TCTS told her refills should go through [facility]): Amiodarone 200 mg - taking 2 tablets daily Lopressor 50 mg BID Protonix 40 mg daily  Aware that I will forward to [name] for refill approval of all meds listed above, (being that we typically don't refill Protonix, but under circumstances he may be willing temporarily). Aware I will let her know by next week. Pt agreeable to plan

## 2020-03-02 NOTE — Telephone Encounter (Signed)
Patient states she would like 30 days of the medications instead of 90.

## 2020-03-05 ENCOUNTER — Ambulatory Visit (INDEPENDENT_AMBULATORY_CARE_PROVIDER_SITE_OTHER): Payer: Medicare Other | Admitting: *Deleted

## 2020-03-05 ENCOUNTER — Telehealth: Payer: Self-pay | Admitting: Cardiology

## 2020-03-05 DIAGNOSIS — F419 Anxiety disorder, unspecified: Secondary | ICD-10-CM | POA: Diagnosis not present

## 2020-03-05 DIAGNOSIS — I4891 Unspecified atrial fibrillation: Secondary | ICD-10-CM | POA: Diagnosis not present

## 2020-03-05 DIAGNOSIS — I051 Rheumatic mitral insufficiency: Secondary | ICD-10-CM | POA: Diagnosis not present

## 2020-03-05 DIAGNOSIS — I480 Paroxysmal atrial fibrillation: Secondary | ICD-10-CM | POA: Diagnosis not present

## 2020-03-05 DIAGNOSIS — F329 Major depressive disorder, single episode, unspecified: Secondary | ICD-10-CM | POA: Diagnosis not present

## 2020-03-05 DIAGNOSIS — Z9889 Other specified postprocedural states: Secondary | ICD-10-CM | POA: Diagnosis not present

## 2020-03-05 DIAGNOSIS — Z8679 Personal history of other diseases of the circulatory system: Secondary | ICD-10-CM

## 2020-03-05 DIAGNOSIS — D62 Acute posthemorrhagic anemia: Secondary | ICD-10-CM | POA: Diagnosis not present

## 2020-03-05 DIAGNOSIS — Z7901 Long term (current) use of anticoagulants: Secondary | ICD-10-CM | POA: Diagnosis not present

## 2020-03-05 DIAGNOSIS — Z48812 Encounter for surgical aftercare following surgery on the circulatory system: Secondary | ICD-10-CM | POA: Diagnosis not present

## 2020-03-05 LAB — POCT INR: INR: 1.7 — AB (ref 2.0–3.0)

## 2020-03-05 NOTE — Patient Instructions (Addendum)
Description   Spoke with Levada Dy and instructed for pt to take 3 tablets today then start taking 2 tablets daily except for 1 tablet on Sundays and Thursdays. Recheck INR in 1 week by [facility]. Keep Ensure at 1 per day. Call coumadin clinic for any changes in medications or upcoming procedures. [phone]

## 2020-03-05 NOTE — Telephone Encounter (Signed)
New  Message:      Levada Dy from [facility] said she would ,like to talk to [facility] or another nurse if she is not available. She wants to give an update on pt's condition.

## 2020-03-05 NOTE — Telephone Encounter (Signed)
HHPT wanted to update [place] on pt status.  States pt is doing better than pt thinks/feels she is.  They are going to give her "gentle push/encouragement" going forward to try and get her to ambulate more on her own.  Pt weight is stable,  Pt c/o nausea, chest cavity soreness and constipation.  They informed pt that she should take her pain medication when she is feeling discomfort as taking this should aid in reducing discomfort and may allow her to take a deep breath.  They instructed her to continue Miralax to help with her bowels. HH reports inspiratory right base crackles, this is not new, but pt was encouraged to use her incentive spirometer.  Thanked [place] for call/update, she stated she would keep [place] informed of pt status.

## 2020-03-06 ENCOUNTER — Encounter: Payer: Self-pay | Admitting: *Deleted

## 2020-03-06 ENCOUNTER — Other Ambulatory Visit: Payer: Self-pay

## 2020-03-06 ENCOUNTER — Other Ambulatory Visit: Payer: Self-pay | Admitting: *Deleted

## 2020-03-06 MED ORDER — PANTOPRAZOLE SODIUM 40 MG PO TBEC: 40.0000 mg | DELAYED_RELEASE_TABLET | Freq: Every day | ORAL | 1 refills | Status: DC

## 2020-03-06 MED ORDER — METOPROLOL TARTRATE 50 MG PO TABS: 50.0000 mg | ORAL_TABLET | Freq: Two times a day (BID) | ORAL | 3 refills | Status: DC

## 2020-03-06 MED ORDER — AMIODARONE HCL 200 MG PO TABS
400.0000 mg | ORAL_TABLET | Freq: Every day | ORAL | 3 refills | Status: DC
Start: 2020-03-06 — End: 2020-04-03

## 2020-03-06 NOTE — Telephone Encounter (Signed)
Pt states she needs refills on Amiodarone, Lopressor & Protonix.  States she does not need refills on Lasix at this time. [name] agreeable to refilling. Informed that I would send in 30 day supply w/ refills per pt request. Advised to discuss Amiodarone dosing and if she should continue Protonix at her next visit w/ Dr. Roxy Manns s/p MVR follow up.  She is agreeable.

## 2020-03-06 NOTE — Patient Outreach (Signed)
[facility]) Care Management  03/06/2020  [name] [birth year] [id number]   [facility] outreach forpost hospital complex carereferral  [name] is a 74 year old patient who was referred to [facility] on 02/10/20 Referral Source:Victoria Doug Sou [facility] RN CM hospital liaison Referral Reason:Please assign to [place] Coordinator for complex careanddisease management follow up calls, medication questions/needsandassess for further needs Insurance:NextGen Medicare Last admission was 02/02/20 to 02/08/20 For debility Neuropathy Paroxysmal Atrial fibrillation (PAF)   Outreach attempt successful to her home number  In greeting [name] informs [name] RN CM she was lying down.  She is slow to answer the HIPAA questions as she states she gets various calls [facility] RN CM discuss the importance of HIPAA with her and was able to get her to identify herself, (DOB, Address)  She was reminded of the previous outreach calls to her from [place] and the [facility] welcome letter sent to her home [name] RN CM discussed care coordination with her  She spoke with "[name]" at the home at intervals  She states she is doing fair to good. She tells [facility] RN CM about her cardiac history  [facility] RN CM redirects her back to the assessment questions and follow up reasons, coordination of care, pcp f/u appointment, progress with home health [facility]) Physical therapy (PT) and medication questions  PCP follow up  She confirms she has made an appointment to follow up with [name] on 03/13/20. Epic indicates the last office visit was on 12/17/2016 [facility] RN CM discussed the importance of pcp follow up post hospitalization and that Epic indicated the last pcp visit was 12/17/2016 She states she has been seeing her specialists vs pcp  [facility] RN CM discussed pcp important as the gatekeeper of care related to her specialists and assisting with other health care needs specialists can not She voiced understanding   For preventative care  she confirms she had her flu shot and eyes checked last in 2020 She has an ENT she sees periodically who was seen in 2020 She reports recently getting a letter to go to get her mammogram completed  Medications It is noted she recently had Dr Baird Kay to renew a few medications for her  On today she verifies she has all the medications she needs at home to take on a daily basis She denies further questions about medications as indicated in the initial referral  Appetite doing well and reports she is taking in Ensure vs listed prosource plus She denies any falls  Mobility progression She was given encouragement to continue to work with Levada Dy, [facility]) Physical therapy (PT) to improve with mobility  It is noted in [place] updated cardiology office on 03/05/20 that [name] is doing better than she states she is doing     Past Medical History:  Diagnosis Date  . Anxiety   . Arthritis    "maybe in my back" (03/31/2018)  . Benign paroxysmal positional vertigo 06/08/2013  . Complication of anesthesia   . Fracture of multiple ribs 2015   "don't know from what; dx'd when I in hospital for 1st back OR" (03/31/2018)  . GERD (gastroesophageal reflux disease)   . Hair loss 04/12/2012  . Herpes   . History of blood transfusion    "twice; related to back OR" (03/31/2018)  . History of kidney stones   . Interstitial cystitis 11/06/2011  . 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  Mitral regurgitation   . Osteopenia 02/18/2012  . Osteoporosis   . PAF (paroxysmal atrial fibrillation) ([place]) 2012  . Peripheral neuropathy 11/06/2011  . PMDD (premenstrual dysphoric disorder)   . PONV (postoperative nausea and vomiting)    nausea, vomiting, hives and dizziness   . 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/2020   Complete bilateral atrial lesion set using cryothermy and bipolar radiofrequency ablation with clipping of LA appendage via right mini-thoracotomy approach  . S/P mitral  valve repair 01/17/2020   Complex valvuloplasty including artificial Gore-tex neochord placement x12 with 2mm Sorin Memo 4D ring annuloplasty  . Seasonal allergies   . Vaginal delivery    ONE NSVD  . 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  [facility]) Patient will be able to manage her atrial fibrillation at home (pt-stated)   On track     [place] (see longitudinal plan of care for additional care plan information)   Current Barriers:  [name] Knowledge deficit related to basic atrial fibrillation pathophysiology and self care management . Cognitive Deficits  Case Manager Clinical Goal(s):  [name] Over the next 90 days, patient will verbalize understanding of atrial fibrillation Action Plan and when to call doctor 03/06/20 progressing . Over the next 45 days, patient will take all atrial fibrillation mediations as prescribed- 03/06/20 progressing . Patient will verbalize two symptoms of atrial fibrillation exacerbation (increase chest pain, irregular heart rate, dizziness, weakness, fainting, struggle to breathe, confusion, symptoms of stroke) within the next 30 days.  03/06/20 progressing  Interventions:  . Basic overview and discussion of pathophysiology of atrial fibrillation reviewed  . Provided written education on heart healthy diet . Reviewed atrial fibrillation Action Plan in depth and provided written copy . Review use of anticoagulant . Discussed importance of pcp follow up . Discussed THN services again . Discussed preventative care  . Provided encouragement   Patient Self Care Activities:  . Takes atrial fibrillation  Medications as prescribed . Verbalizes understanding of and follows atrial fibrillation  Action Plan . Adheres to low sodium diet . Continue to work with  [facility] PT  . Visit pcp on 03/13/20  Please see past updates related to this goal by clicking on the "Past Updates" button in the selected goal         [place]igh[place] Amadon L. Lavina Hamman, RN, BSN, [place] Coordinator Office number [phone] Mobile number [phone]  Main [facility]N number [phone] Fax number [phone]

## 2020-03-06 NOTE — Telephone Encounter (Signed)
Pt calling again stating that she is almost out of her medications and need a refill on metoprolol, pantoprazole, pacerone and furosemide. Pt states that she called last week and still has not heard anything concerning this matter. Pt would like a call back as soon as possible. Please address

## 2020-03-07 ENCOUNTER — Telehealth: Payer: Self-pay

## 2020-03-07 ENCOUNTER — Other Ambulatory Visit: Payer: Self-pay | Admitting: Physical Medicine and Rehabilitation

## 2020-03-07 ENCOUNTER — Other Ambulatory Visit: Payer: Self-pay | Admitting: Cardiology

## 2020-03-07 MED ORDER — POTASSIUM CHLORIDE ER 10 MEQ PO TBCR: 20.0000 meq | EXTENDED_RELEASE_TABLET | Freq: Every day | ORAL | 9 refills | Status: DC

## 2020-03-07 NOTE — Telephone Encounter (Signed)
Spent 28 minutes on phone w/ pt.  Pt instructed that I spoke w/ Dr. Ricard Dillon office about her Amiodarone & Protonix. Pt made aware that she may decrease her Amiodarone to 200 mg ONCE daily  & that she may stop the Protonix and return to Pepcid. Pt agreeable to plan. Upon reviewing medication list she does not believe she is taking Potassium supplement.  She is going to check w/ pharmacy and let me know.  Advised that if not taking we will send in Rx and get some blood work Artist). Pt will call me and let me know;

## 2020-03-07 NOTE — Telephone Encounter (Signed)
*  STAT* If patient is at the pharmacy, call can be transferred to refill team.   1. Which medications need to be refilled? (please list name of each medication and dose if known) potassium chloride (KLOR-CON) 10 MEQ tablet  2. Which pharmacy/location (including street and city if local pharmacy) is medication to be sent to? [place], [place]  3. Do they need a 30 day or 90 day supply? 30 day

## 2020-03-07 NOTE — Telephone Encounter (Signed)
Pt's medication was sent to pt's pharmacy as requested. Confirmation received.  °

## 2020-03-07 NOTE — Telephone Encounter (Signed)
Pt called asking if she is still taking Tikosyn and Xarelto and requesting an appt to see [place].

## 2020-03-08 DIAGNOSIS — D62 Acute posthemorrhagic anemia: Secondary | ICD-10-CM | POA: Diagnosis not present

## 2020-03-08 DIAGNOSIS — I480 Paroxysmal atrial fibrillation: Secondary | ICD-10-CM | POA: Diagnosis not present

## 2020-03-08 DIAGNOSIS — I051 Rheumatic mitral insufficiency: Secondary | ICD-10-CM | POA: Diagnosis not present

## 2020-03-08 DIAGNOSIS — F419 Anxiety disorder, unspecified: Secondary | ICD-10-CM | POA: Diagnosis not present

## 2020-03-08 DIAGNOSIS — F329 Major depressive disorder, single episode, unspecified: Secondary | ICD-10-CM | POA: Diagnosis not present

## 2020-03-08 DIAGNOSIS — Z48812 Encounter for surgical aftercare following surgery on the circulatory system: Secondary | ICD-10-CM | POA: Diagnosis not present

## 2020-03-08 NOTE — Telephone Encounter (Signed)
Spent 18 minutes on the phone w/ pt and her sister. Pt reports that she has not been taking Potassium.  Aware that Rx was sent in 2 days ago and to have filled and begin taking. Informed pt that she will need BMET follow up lab work.  She has [facility] coming to her home on Tuesday and asks that they draw while she is there.  Informed that will be acceptable if they are able, we are glad to give order for that. Pt and sister appreciate the return call.

## 2020-03-08 NOTE — Telephone Encounter (Signed)
[name] with [name] is requesting to follow up with RN regarding lab order. She would like to confirm the type of order that will be sent. Please return call to discuss.

## 2020-03-08 NOTE — Telephone Encounter (Signed)
    Pt is calling back, she said she need to clarify the dosage of her medication. She said to call her as soon as possible since she needs to know if she taking the right dosage.

## 2020-03-08 NOTE — Telephone Encounter (Signed)
Returned call.  HHRN will obtain BMET next week and fax [place] result.

## 2020-03-09 ENCOUNTER — Other Ambulatory Visit: Payer: Self-pay

## 2020-03-09 ENCOUNTER — Other Ambulatory Visit: Payer: Self-pay | Admitting: Physical Medicine and Rehabilitation

## 2020-03-09 MED ORDER — FUROSEMIDE 40 MG PO TABS: 40.0000 mg | ORAL_TABLET | Freq: Every day | ORAL | 2 refills | Status: DC

## 2020-03-09 NOTE — Telephone Encounter (Signed)
Pt's medication was sent to pt's pharmacy as requested. Confirmation received.  °

## 2020-03-11 DIAGNOSIS — Z7901 Long term (current) use of anticoagulants: Secondary | ICD-10-CM | POA: Diagnosis not present

## 2020-03-11 DIAGNOSIS — D62 Acute posthemorrhagic anemia: Secondary | ICD-10-CM | POA: Diagnosis not present

## 2020-03-11 DIAGNOSIS — F419 Anxiety disorder, unspecified: Secondary | ICD-10-CM | POA: Diagnosis not present

## 2020-03-11 DIAGNOSIS — M8580 Other specified disorders of bone density and structure, unspecified site: Secondary | ICD-10-CM | POA: Diagnosis not present

## 2020-03-11 DIAGNOSIS — Z952 Presence of prosthetic heart valve: Secondary | ICD-10-CM | POA: Diagnosis not present

## 2020-03-11 DIAGNOSIS — Z48812 Encounter for surgical aftercare following surgery on the circulatory system: Secondary | ICD-10-CM | POA: Diagnosis not present

## 2020-03-11 DIAGNOSIS — F32A Depression, unspecified: Secondary | ICD-10-CM | POA: Diagnosis not present

## 2020-03-11 DIAGNOSIS — K219 Gastro-esophageal reflux disease without esophagitis: Secondary | ICD-10-CM | POA: Diagnosis not present

## 2020-03-11 DIAGNOSIS — I480 Paroxysmal atrial fibrillation: Secondary | ICD-10-CM | POA: Diagnosis not present

## 2020-03-11 DIAGNOSIS — K5900 Constipation, unspecified: Secondary | ICD-10-CM | POA: Diagnosis not present

## 2020-03-11 DIAGNOSIS — Z5181 Encounter for therapeutic drug level monitoring: Secondary | ICD-10-CM | POA: Diagnosis not present

## 2020-03-11 DIAGNOSIS — G629 Polyneuropathy, unspecified: Secondary | ICD-10-CM | POA: Diagnosis not present

## 2020-03-11 DIAGNOSIS — M4125 Other idiopathic scoliosis, thoracolumbar region: Secondary | ICD-10-CM | POA: Diagnosis not present

## 2020-03-11 DIAGNOSIS — Z79891 Long term (current) use of opiate analgesic: Secondary | ICD-10-CM | POA: Diagnosis not present

## 2020-03-11 DIAGNOSIS — J309 Allergic rhinitis, unspecified: Secondary | ICD-10-CM | POA: Diagnosis not present

## 2020-03-11 DIAGNOSIS — M810 Age-related osteoporosis without current pathological fracture: Secondary | ICD-10-CM | POA: Diagnosis not present

## 2020-03-11 DIAGNOSIS — Z9181 History of falling: Secondary | ICD-10-CM | POA: Diagnosis not present

## 2020-03-11 DIAGNOSIS — I051 Rheumatic mitral insufficiency: Secondary | ICD-10-CM | POA: Diagnosis not present

## 2020-03-11 DIAGNOSIS — R1310 Dysphagia, unspecified: Secondary | ICD-10-CM | POA: Diagnosis not present

## 2020-03-11 DIAGNOSIS — H8110 Benign paroxysmal vertigo, unspecified ear: Secondary | ICD-10-CM | POA: Diagnosis not present

## 2020-03-11 DIAGNOSIS — Z7951 Long term (current) use of inhaled steroids: Secondary | ICD-10-CM | POA: Diagnosis not present

## 2020-03-11 DIAGNOSIS — G8929 Other chronic pain: Secondary | ICD-10-CM | POA: Diagnosis not present

## 2020-03-11 DIAGNOSIS — F329 Major depressive disorder, single episode, unspecified: Secondary | ICD-10-CM | POA: Diagnosis not present

## 2020-03-11 DIAGNOSIS — E538 Deficiency of other specified B group vitamins: Secondary | ICD-10-CM | POA: Diagnosis not present

## 2020-03-11 DIAGNOSIS — M1990 Unspecified osteoarthritis, unspecified site: Secondary | ICD-10-CM | POA: Diagnosis not present

## 2020-03-11 DIAGNOSIS — E785 Hyperlipidemia, unspecified: Secondary | ICD-10-CM | POA: Diagnosis not present

## 2020-03-13 ENCOUNTER — Inpatient Hospital Stay: Payer: Medicare Other | Admitting: Family Medicine

## 2020-03-13 ENCOUNTER — Ambulatory Visit (INDEPENDENT_AMBULATORY_CARE_PROVIDER_SITE_OTHER): Payer: Medicare Other | Admitting: Pharmacist

## 2020-03-13 DIAGNOSIS — F32A Depression, unspecified: Secondary | ICD-10-CM | POA: Diagnosis not present

## 2020-03-13 DIAGNOSIS — Z9889 Other specified postprocedural states: Secondary | ICD-10-CM

## 2020-03-13 DIAGNOSIS — D62 Acute posthemorrhagic anemia: Secondary | ICD-10-CM | POA: Diagnosis not present

## 2020-03-13 DIAGNOSIS — Z7901 Long term (current) use of anticoagulants: Secondary | ICD-10-CM | POA: Diagnosis not present

## 2020-03-13 DIAGNOSIS — I10 Essential (primary) hypertension: Secondary | ICD-10-CM | POA: Diagnosis not present

## 2020-03-13 DIAGNOSIS — I480 Paroxysmal atrial fibrillation: Secondary | ICD-10-CM | POA: Diagnosis not present

## 2020-03-13 DIAGNOSIS — I4811 Longstanding persistent atrial fibrillation: Secondary | ICD-10-CM

## 2020-03-13 DIAGNOSIS — F329 Major depressive disorder, single episode, unspecified: Secondary | ICD-10-CM | POA: Diagnosis not present

## 2020-03-13 DIAGNOSIS — I051 Rheumatic mitral insufficiency: Secondary | ICD-10-CM | POA: Diagnosis not present

## 2020-03-13 DIAGNOSIS — Z8679 Personal history of other diseases of the circulatory system: Secondary | ICD-10-CM

## 2020-03-13 DIAGNOSIS — Z48812 Encounter for surgical aftercare following surgery on the circulatory system: Secondary | ICD-10-CM | POA: Diagnosis not present

## 2020-03-13 DIAGNOSIS — F419 Anxiety disorder, unspecified: Secondary | ICD-10-CM | POA: Diagnosis not present

## 2020-03-13 LAB — POCT INR: INR: 1.8 — AB (ref 2.0–3.0)

## 2020-03-13 NOTE — Patient Instructions (Signed)
Description   Spoke with Levada Dy and instructed for pt to take 3 tablets today then start taking 2 tablets daily except for 1 tablet on Sundays and Thursdays. Recheck INR in 1 week by [facility]. Keep Ensure at 1 per day. Call coumadin clinic for any changes in medications or upcoming procedures. [phone]

## 2020-03-14 ENCOUNTER — Telehealth: Payer: Self-pay | Admitting: Family Medicine

## 2020-03-14 ENCOUNTER — Other Ambulatory Visit: Payer: Self-pay

## 2020-03-14 ENCOUNTER — Other Ambulatory Visit: Payer: Self-pay | Admitting: *Deleted

## 2020-03-14 DIAGNOSIS — Z823 Family history of stroke: Secondary | ICD-10-CM | POA: Diagnosis not present

## 2020-03-14 DIAGNOSIS — I341 Nonrheumatic mitral (valve) prolapse: Secondary | ICD-10-CM | POA: Diagnosis not present

## 2020-03-14 DIAGNOSIS — Z8249 Family history of ischemic heart disease and other diseases of the circulatory system: Secondary | ICD-10-CM | POA: Diagnosis not present

## 2020-03-14 DIAGNOSIS — I2510 Atherosclerotic heart disease of native coronary artery without angina pectoris: Secondary | ICD-10-CM | POA: Diagnosis not present

## 2020-03-14 DIAGNOSIS — I4891 Unspecified atrial fibrillation: Secondary | ICD-10-CM | POA: Diagnosis not present

## 2020-03-14 DIAGNOSIS — R0602 Shortness of breath: Secondary | ICD-10-CM

## 2020-03-14 DIAGNOSIS — Z8679 Personal history of other diseases of the circulatory system: Secondary | ICD-10-CM | POA: Diagnosis not present

## 2020-03-14 NOTE — Telephone Encounter (Signed)
Picked up from the back, faxed, and sent to scan  

## 2020-03-14 NOTE — Telephone Encounter (Signed)
Form completed and placed in basket  

## 2020-03-14 NOTE — Telephone Encounter (Signed)
Patient called stating she wanted to move her hospital follow up from Friday to Thursday stating that she was in the hospital (had heart surgery) and is now having issues.    Patient stated she was having difficulty breathing.  Stated the last time she had difficulty breathing she had to have an x ray completed and fluid drawn off her lungs.    I asked patient if she had reached out to her surgeon.  Patient stated she had not.    I informed patient I would need to transfer her over to triage, stating they may suggest her to go to the hospital.  Informed patient that certain issues needed to be evaluated at the hospital due to the hospital being better equipped.  Patient stated she did not want to go to the hospital.  Stated she only wanted to see her PCP.    I talked patient into getting triaged.  Patient hung up while on hold in order to call her cardiologist.    Patient called back stating that she is no longer having breathing issues but her cardiologist is sending her for an x ray tomorrow (03/15/20). States her Cardiologist informed her that she is not likely to have fluid on her lungs since her surgery was back on July 13th.    States she is going to keep her appt with Dr. [name] on Friday.    Patient states she will refuse further triage when [facility] tries to reach out to her.

## 2020-03-14 NOTE — Patient Outreach (Signed)
[facility]) Care Management  03/14/2020  [name] 11/21/1945 [id number]   [facility] unsuccessful outreach to [facility] engaged patient for complex care   Mrs [name] is a 74 year old patient who was referred to [facility] on 02/10/20 Referral Source:Victoria Doug Sou [facility] RN CM hospital liaison Referral Reason:Please assign to [place] Coordinator for complex careanddisease management follow up calls, medication questions/needsandassess for further needs Insurance:NextGen Medicare Last admission was 02/02/20 to 02/08/20 For debility Neuropathy Paroxysmal Atrial fibrillation (PAF)  Unsuccessful outreach  No answer. [facility] RN CM left HIPAA [facility] Portability and Accountability Act) compliant voicemail message along with CM's contact info.   Plan: [facility] RN CM scheduled this [facility] engaged patient for another call attempt within 7-10 business days  Clarke Amburn L. Lavina Hamman, RN, BSN, [place] Coordinator Office number [phone] Main [facility] number [phone] Fax number [phone]

## 2020-03-14 NOTE — Telephone Encounter (Signed)
..  Home Health Certification or Plan of Care Tracking   [facility] Agency: [facility]   Order Number:  [id number]  I have attached a charge sheet and placed it in the bin upfront.

## 2020-03-14 NOTE — Telephone Encounter (Signed)
Fyi.

## 2020-03-14 NOTE — Telephone Encounter (Signed)
Paperwork placed in PCP folder for review

## 2020-03-15 ENCOUNTER — Telehealth: Payer: Self-pay

## 2020-03-15 ENCOUNTER — Encounter: Payer: Medicare Other | Admitting: Physical Medicine and Rehabilitation

## 2020-03-15 ENCOUNTER — Other Ambulatory Visit: Payer: Self-pay

## 2020-03-15 ENCOUNTER — Ambulatory Visit
Admission: RE | Admit: 2020-03-15 | Discharge: 2020-03-15 | Disposition: A | Discharge status: Home / Self Care | Payer: Medicare Other | Source: Ambulatory Visit | Attending: Thoracic Surgery (Cardiothoracic Vascular Surgery) | Admitting: Thoracic Surgery (Cardiothoracic Vascular Surgery)

## 2020-03-15 DIAGNOSIS — J90 Pleural effusion, not elsewhere classified: Secondary | ICD-10-CM

## 2020-03-15 DIAGNOSIS — R0602 Shortness of breath: Secondary | ICD-10-CM | POA: Diagnosis not present

## 2020-03-15 NOTE — Telephone Encounter (Signed)
-----   Message from Rexene Alberts, MD sent at 03/15/2020  1:31 PM EDT ----- Regarding: RE: please review CXR from today CXR looks fine Small pleural effusion - not enough to be concerned about at this time Check to see if her weight is up, she has any leg swelling, and whether or not she is currently on a diuretic - she may need one Can get f/u CXR in 3-4 weeks or at the time of her next appt in our office ----- Message ----- From: Marylen Ponto, LPN Sent: 02/10/1958  74:71 AM EDT To: Rexene Alberts, MD Subject: please review CXR from today                   [name] called late yesterday c/o increased SOB and request a CXR. She had the CXR  done this AM, can you please review and advise. She is seeing her PCP today also. Thanks SW

## 2020-03-15 NOTE — Telephone Encounter (Signed)
FYI

## 2020-03-16 ENCOUNTER — Encounter: Payer: Self-pay | Admitting: Family Medicine

## 2020-03-16 ENCOUNTER — Ambulatory Visit (INDEPENDENT_AMBULATORY_CARE_PROVIDER_SITE_OTHER): Payer: Medicare Other | Admitting: Family Medicine

## 2020-03-16 ENCOUNTER — Other Ambulatory Visit: Payer: Self-pay

## 2020-03-16 VITALS — BP 101/59 | HR 69 | Temp 97.9°F | Resp 16 | Ht 66.0 in | Wt 120.5 lb

## 2020-03-16 DIAGNOSIS — R9389 Abnormal findings on diagnostic imaging of other specified body structures: Secondary | ICD-10-CM

## 2020-03-16 DIAGNOSIS — R110 Nausea: Secondary | ICD-10-CM | POA: Diagnosis not present

## 2020-03-16 DIAGNOSIS — Z9889 Other specified postprocedural states: Secondary | ICD-10-CM | POA: Diagnosis not present

## 2020-03-16 DIAGNOSIS — G8929 Other chronic pain: Secondary | ICD-10-CM

## 2020-03-16 DIAGNOSIS — F419 Anxiety disorder, unspecified: Secondary | ICD-10-CM | POA: Diagnosis not present

## 2020-03-16 MED ORDER — CLONAZEPAM 0.5 MG PO TABS: 0.5000 mg | ORAL_TABLET | Freq: Two times a day (BID) | ORAL | 3 refills | Status: DC | PRN

## 2020-03-16 MED ORDER — ONDANSETRON HCL 4 MG PO TABS: 4.0000 mg | ORAL_TABLET | Freq: Three times a day (TID) | ORAL | 1 refills | Status: DC | PRN

## 2020-03-16 NOTE — Patient Instructions (Signed)
Follow up as needed or as scheduled I refilled the Clonazepam for you to use as needed for anxiety Use the Ondansetron (Zofran) as needed for nausea Speak w/ your pain management doctor about alternatives to Oxycodone Continue to follow Dr Guy Sandifer directions Allow yourself time to heal and recover Hang in there!

## 2020-03-16 NOTE — Assessment & Plan Note (Signed)
Ongoing issue for pt.  She again begged me not to stop her Clonazepam.  I again told her that while I don't like her taking this for anxiety her body is physically dependent on it and at this time, I do not intend to stop it.  She states it's the only thing that works for her, but we did discuss that every SSRI or SNRI that she tried she never took more than 1-2 doses despite being told it would take 7-10 days to see if transient side effects improved.  Refill provided.

## 2020-03-16 NOTE — Assessment & Plan Note (Signed)
Pt is physically doing well after her open heart surgery but is again struggling w/ anxiety and depression.  This is an ongoing battle for her and she reports being intolerant to all previous anxiety/depression medications that were tried.  I told her that she seems to be doing quite well for such a big surgery- especially given her age and multiple medical issues.  Encouraged her to continue f/u w/ surgeon and cardiologist.  Pt expressed understanding and is in agreement w/ plan.

## 2020-03-16 NOTE — Progress Notes (Signed)
   Subjective:    Patient ID: [name], female    DOB: 01/10/1946, 74 y.o.   MRN: [id number]  HPI S/p mitral valve repair- pt had surgery w/ Dr Roxy Manns on 7/13.  Pt was d/c'd from rehab on 8/4.  Continues to work w/ [facility] PT.  Pt had a CXR yesterday due to SOB which showed 'Increased fluid (now small to moderate) associated with a loculated right basilar hydropneumothorax with air-fluid levels. Increased overlying opacity may represent fluid, atelectasis, aspiration, and/or pneumonia. CT could further characterize, if clinically indicated.'  Pt was told by Dr Roxy Manns that 'CXR looks fine'.  Pt reports SOB is better today.  Chronic pain- pt has been on Oxycodone for years and has upcoming appt w/ pain management.  She wants to know if there's something else she should take.  Anxiety- chronic problem.  Son is now living with her 'and he keeps me pretty stressed out'.  Has been intolerant to all attempted SSRIs/SNRIs.  She is asking to continue her Clonazepam.  + nausea- often occurs after taking medication.  Also on Oxycodone which causes nausea. Was previously on Zofran.   Review of Systems For ROS see HPI   This visit occurred during the SARS-CoV-2 public health emergency.  Safety protocols were in place, including screening questions prior to the visit, additional usage of staff PPE, and extensive cleaning of exam room while observing appropriate contact time as indicated for disinfecting solutions.       Objective:   Physical Exam Vitals reviewed.  Constitutional:      General: She is not in acute distress.    Appearance: She is well-developed. She is not ill-appearing.  HENT:     Head: Normocephalic and atraumatic.  Eyes:     Conjunctiva/sclera: Conjunctivae normal.     Pupils: Pupils are equal, round, and reactive to light.  Neck:     Thyroid: No thyromegaly.  Cardiovascular:     Rate and Rhythm: Normal rate and regular rhythm.     Heart sounds: Normal heart sounds. No murmur heard.     Pulmonary:     Effort: Pulmonary effort is normal. No respiratory distress.     Comments: Decreased BS at R base Abdominal:     General: There is no distension.     Palpations: Abdomen is soft.     Tenderness: There is no abdominal tenderness.  Musculoskeletal:     Cervical back: Normal range of motion and neck supple.  Lymphadenopathy:     Cervical: No cervical adenopathy.  Skin:    General: Skin is warm and dry.  Neurological:     Mental Status: She is alert and oriented to person, place, and time.  Psychiatric:        Behavior: Behavior normal.     Comments: anxious           Assessment & Plan:  Nausea- likely due to chronic narcotic use.  Refill of Zofran provided  Abnormal CXR- pt has R pleural effusion noted on CXR.  Dr Roxy Manns felt CXR was 'fine' and pt states her shortness of breath is better today.  She does have diminished breath sounds at R base- consistent w/ fluid accumulation- but will defer further workup to Dr Roxy Manns.

## 2020-03-16 NOTE — Assessment & Plan Note (Signed)
Ongoing issue for pt.  Has appt upcoming w/ pain management.  Discussed the possibility of weaning Oxycodone but this would need a formalized plan as her body is physically dependent on opiates at this point.

## 2020-03-19 ENCOUNTER — Ambulatory Visit (INDEPENDENT_AMBULATORY_CARE_PROVIDER_SITE_OTHER): Payer: Medicare Other | Admitting: Pharmacist

## 2020-03-19 ENCOUNTER — Telehealth: Payer: Self-pay | Admitting: Cardiology

## 2020-03-19 DIAGNOSIS — Z7901 Long term (current) use of anticoagulants: Secondary | ICD-10-CM

## 2020-03-19 DIAGNOSIS — I4891 Unspecified atrial fibrillation: Secondary | ICD-10-CM | POA: Diagnosis not present

## 2020-03-19 DIAGNOSIS — I051 Rheumatic mitral insufficiency: Secondary | ICD-10-CM | POA: Diagnosis not present

## 2020-03-19 DIAGNOSIS — F419 Anxiety disorder, unspecified: Secondary | ICD-10-CM | POA: Diagnosis not present

## 2020-03-19 DIAGNOSIS — F329 Major depressive disorder, single episode, unspecified: Secondary | ICD-10-CM | POA: Diagnosis not present

## 2020-03-19 DIAGNOSIS — M5412 Radiculopathy, cervical region: Secondary | ICD-10-CM | POA: Diagnosis not present

## 2020-03-19 DIAGNOSIS — I480 Paroxysmal atrial fibrillation: Secondary | ICD-10-CM | POA: Diagnosis not present

## 2020-03-19 DIAGNOSIS — Z48812 Encounter for surgical aftercare following surgery on the circulatory system: Secondary | ICD-10-CM | POA: Diagnosis not present

## 2020-03-19 DIAGNOSIS — Z79891 Long term (current) use of opiate analgesic: Secondary | ICD-10-CM | POA: Diagnosis not present

## 2020-03-19 DIAGNOSIS — M961 Postlaminectomy syndrome, not elsewhere classified: Secondary | ICD-10-CM | POA: Diagnosis not present

## 2020-03-19 DIAGNOSIS — Z8679 Personal history of other diseases of the circulatory system: Secondary | ICD-10-CM | POA: Diagnosis not present

## 2020-03-19 DIAGNOSIS — M4693 Unspecified inflammatory spondylopathy, cervicothoracic region: Secondary | ICD-10-CM | POA: Diagnosis not present

## 2020-03-19 DIAGNOSIS — F32A Depression, unspecified: Secondary | ICD-10-CM | POA: Diagnosis not present

## 2020-03-19 DIAGNOSIS — G894 Chronic pain syndrome: Secondary | ICD-10-CM | POA: Diagnosis not present

## 2020-03-19 DIAGNOSIS — D62 Acute posthemorrhagic anemia: Secondary | ICD-10-CM | POA: Diagnosis not present

## 2020-03-19 DIAGNOSIS — G500 Trigeminal neuralgia: Secondary | ICD-10-CM | POA: Diagnosis not present

## 2020-03-19 DIAGNOSIS — Z9889 Other specified postprocedural states: Secondary | ICD-10-CM

## 2020-03-19 LAB — POCT INR: INR: 2.3 (ref 2.0–3.0)

## 2020-03-19 NOTE — Telephone Encounter (Signed)
Results noted, see anticoagulation note in Epic.  

## 2020-03-19 NOTE — Telephone Encounter (Signed)
New message:   Otila Kluver from [place] calling to report patient INR came back at 2.3.

## 2020-03-21 ENCOUNTER — Other Ambulatory Visit: Payer: Self-pay | Admitting: *Deleted

## 2020-03-21 ENCOUNTER — Telehealth: Payer: Self-pay | Admitting: Cardiology

## 2020-03-21 DIAGNOSIS — Z48812 Encounter for surgical aftercare following surgery on the circulatory system: Secondary | ICD-10-CM | POA: Diagnosis not present

## 2020-03-21 DIAGNOSIS — I051 Rheumatic mitral insufficiency: Secondary | ICD-10-CM | POA: Diagnosis not present

## 2020-03-21 DIAGNOSIS — D62 Acute posthemorrhagic anemia: Secondary | ICD-10-CM | POA: Diagnosis not present

## 2020-03-21 DIAGNOSIS — F419 Anxiety disorder, unspecified: Secondary | ICD-10-CM | POA: Diagnosis not present

## 2020-03-21 DIAGNOSIS — I480 Paroxysmal atrial fibrillation: Secondary | ICD-10-CM | POA: Diagnosis not present

## 2020-03-21 DIAGNOSIS — F329 Major depressive disorder, single episode, unspecified: Secondary | ICD-10-CM | POA: Diagnosis not present

## 2020-03-21 DIAGNOSIS — F32A Depression, unspecified: Secondary | ICD-10-CM | POA: Diagnosis not present

## 2020-03-21 NOTE — Telephone Encounter (Signed)
Pt c/o medication issue:  1. Name of Medication: potassium chloride (KLOR-CON) 10 MEQ tablet  2. How are you currently taking this medication (dosage and times per day)? Trying to take as instructed  3. Are you having a reaction (difficulty breathing--STAT)? unsure  4. What is your medication issue? Patient needs to speak with Sherri in regards to this medication. She states she is trying to take 2x daily but needs to speak with Sherri about this. Please advise.

## 2020-03-21 NOTE — Telephone Encounter (Signed)
Pt reports ongoing, severe issues with diarrhea/nausea/upset stomach since starting the K+ several weeks ago. Pt advised to hold the medication until hearing back from me.  Aware that I will send to [name] for advisement.  Informed that we do not have the blood work results from [facility] (drawn Monday). Explained that we will await that report to further determine what/if anything needs to be done/changed.  She states that they told her the result was "good". Advised to increase her intake of K+ rich foods. She reports that her swelling has improved greatly. Aware I will call once lab result has been reviewed.

## 2020-03-21 NOTE — Patient Outreach (Signed)
[facility]) Care Management  03/21/2020  [name] May 06, 1946 [id number]    [facility] Unsuccessful outreach   Outreach attempt to the home number [phone] No answer. [facility] RN CM left HIPAA [facility] Portability and Accountability Act) compliant voicemail message along with CM's contact info.   Plan: [facility] RN CM scheduled this patient for another call attempt within 30 business days  Shaunee Mulkern L. Lavina Hamman, RN, BSN, [place] Coordinator Office number [phone] Mobile number [phone]  Main [facility] number [phone] Fax number [phone]

## 2020-03-22 ENCOUNTER — Other Ambulatory Visit: Payer: Self-pay

## 2020-03-23 ENCOUNTER — Encounter
Payer: Medicare Other | Attending: Physical Medicine and Rehabilitation | Admitting: Physical Medicine and Rehabilitation

## 2020-03-23 ENCOUNTER — Other Ambulatory Visit: Payer: Self-pay

## 2020-03-23 ENCOUNTER — Encounter: Payer: Self-pay | Admitting: Physical Medicine and Rehabilitation

## 2020-03-23 VITALS — BP 114/77 | Ht 66.0 in | Wt 120.0 lb

## 2020-03-23 DIAGNOSIS — F419 Anxiety disorder, unspecified: Secondary | ICD-10-CM | POA: Diagnosis not present

## 2020-03-23 DIAGNOSIS — Z9889 Other specified postprocedural states: Secondary | ICD-10-CM

## 2020-03-23 DIAGNOSIS — I051 Rheumatic mitral insufficiency: Secondary | ICD-10-CM | POA: Diagnosis not present

## 2020-03-23 DIAGNOSIS — F32A Depression, unspecified: Secondary | ICD-10-CM | POA: Diagnosis not present

## 2020-03-23 DIAGNOSIS — R5381 Other malaise: Secondary | ICD-10-CM

## 2020-03-23 DIAGNOSIS — F329 Major depressive disorder, single episode, unspecified: Secondary | ICD-10-CM | POA: Diagnosis not present

## 2020-03-23 DIAGNOSIS — D62 Acute posthemorrhagic anemia: Secondary | ICD-10-CM | POA: Diagnosis not present

## 2020-03-23 DIAGNOSIS — I480 Paroxysmal atrial fibrillation: Secondary | ICD-10-CM | POA: Diagnosis not present

## 2020-03-23 DIAGNOSIS — Z48812 Encounter for surgical aftercare following surgery on the circulatory system: Secondary | ICD-10-CM | POA: Diagnosis not present

## 2020-03-23 NOTE — Progress Notes (Signed)
Subjective:    Patient ID: [name], female    DOB: 09-28-1945, 74 y.o.   MRN: [id number]  HPI  Due to national recommendations of social distancing because of COVID 27, an audio/video tele-health visit is felt to be the most appropriate encounter for this patient at this time. See MyChart message from today for the patient's consent to a tele-health encounter with [facility]. This is a follow up tele-visit via Webex. The patient is at home. MD is at office.   [name] is a 74 year old woman who presents for transitional care follow-up after CIR admission for debility following mitral valve repair with Maze procedure.   Her transition home has been ok. She continues to receive home therapy. She feels this has been going well. She is working on leg strengthening, and walking. She is walking well without the walker. She goes out of the house for appointments. She is not able to do shopping or to do the things she loves to do. She does have a handicap placard.   Her average pain is 7/10, and pain right now 5/10. Pain is intermittent, constant, sharp, dull, stabbing, tingling, and aching.  She feels very weak and shortness of breath. She cannot tolerate iron supplements well because of the diarrhea.   He gets constipation from the oxycodone. She has to go back and forth on laxatives. She does like prunes. She benefits from heat packs.    Pain Inventory Average Pain 7 Pain Right Now 5 My pain is intermittent, constant, sharp, burning, dull, stabbing, tingling and aching  In the last 24 hours, has pain interfered with the following? General activity 9 Relation with others 0 Enjoyment of life 7 What TIME of day is your pain at its worst? morning , daytime, evening, night and varies Sleep (in general) Fair  Pain is worse with: walking, sitting, standing and some activites Pain improves with: rest, medication and heat Relief from Meds: at level 6 for  relief.  use a walker how many minutes can you walk? unknown ability to climb steps?  yes do you drive?  no Do you have any goals in this area?  yes  retired I need assistance with the following:  meal prep, household duties, shopping and Family assist. Do you have any goals in this area?  yes  weakness numbness tingling trouble walking anxiety  New Patient  New Patient    Family History  Problem Relation Age of Onset  . Diabetes Father   . Hyperlipidemia Sister   . Heart disease Sister   . Stroke Sister   . Diabetes Brother   . Hyperlipidemia Sister   . Heart disease Sister   . Arthritis Mother   . Heart disease Mother   . Uterine cancer Mother   . Diabetes Brother   . Heart Problems Brother    Social History   Socioeconomic History  . Marital status: Divorced    Spouse name: Not on file  . Number of children: Not on file  . Years of education: Not on file  . Highest education level: 12th grade  Occupational History  . Not on file  Tobacco Use  . Smoking status: Former Smoker    Packs/day: 0.10    Years: 14.00    Pack years: 1.40    Types: Cigarettes  . Smokeless tobacco: Never Used  . Tobacco comment: 03/31/2018 "quit ~ 1980; someday smoker when I did smoke; never addicted"  Vaping Use  . Vaping Use: Never used  Substance and Sexual Activity  . Alcohol use: Never  . Drug use: Yes    Types: Oxycodone, Benzodiazepines    Comment: 03/31/2018 "for chronic neck and back pain", takes Klonopin at times.   . Sexual activity: Not Currently  Other Topics Concern  . Not on file  Social History Narrative  . Not on file   Social Determinants of Health   Financial Resource Strain:   . Difficulty of Paying Living Expenses: Not on file  Food Insecurity: No Food Insecurity  . Worried About Charity fundraiser in the Last Year: Never true  . Ran Out of Food in the Last Year: Never true  Transportation Needs: No Transportation Needs  . Lack of Transportation  (Medical): No  . Lack of Transportation (Non-Medical): No  Physical Activity:   . Days of Exercise per Week: Not on file  . Minutes of Exercise per Session: Not on file  Stress:   . Feeling of Stress : Not on file  Social Connections:   . Frequency of Communication with Friends and Family: Not on file  . Frequency of Social Gatherings with Friends and Family: Not on file  . Attends Religious Services: Not on file  . Active Member of Clubs or Organizations: Not on file  . Attends Archivist Meetings: Not on file  . Marital Status: Not on file   Past Surgical History:  Procedure Laterality Date  . ANTERIOR CERVICAL DECOMP/DISCECTOMY FUSION  ~ 2003  . BACK SURGERY    . BREAST SURGERY     BREAST BIOPSY--RIGHT BENIGN  . BUNIONECTOMY Bilateral   . CARDIOVERSION N/A 01/26/2020   Procedure: CARDIOVERSION;  Surgeon: Geralynn Rile, MD;  Location: [place];  Service: Cardiovascular;  Laterality: N/A;  . COSMETIC SURGERY  2016   "back of my neck; related to earlier fusion"  . CYSTOSCOPY W/ STONE MANIPULATION  "several times"  . DILATION AND CURETTAGE OF UTERUS    . FOREHEAD RECONSTRUCTION Right    "removed bone protruding out of my forehead"  . HARDWARE REMOVAL  2016   "related to neck OR"  . INCONTINENCE SURGERY    . IR THORACENTESIS ASP PLEURAL SPACE W/IMG GUIDE  02/16/2020  . MINIMALLY INVASIVE MAZE PROCEDURE N/A 01/17/2020   Procedure: MINIMALLY INVASIVE MAZE PROCEDURE;  Surgeon: Rexene Alberts, MD;  Location: [place];  Service: Open Heart Surgery;  Laterality: N/A;  . MITRAL VALVE REPAIR Right 01/17/2020   Procedure: MINIMALLY INVASIVE MITRAL VALVE REPAIR (MVR) USING MEMO 4D 32MM;  Surgeon: Rexene Alberts, MD;  Location: [place];  Service: Open Heart Surgery;  Laterality: Right;  . POSTERIOR CERVICAL FUSION/FORAMINOTOMY  ~ 2008; 2015  . RIGHT/LEFT HEART CATH AND CORONARY ANGIOGRAPHY N/A 12/02/2019   Procedure: RIGHT/LEFT HEART CATH AND CORONARY ANGIOGRAPHY;  Surgeon:  Burnell Blanks, MD;  Location: [place] CV LAB;  Service: Cardiovascular;  Laterality: N/A;  . SHOULDER ARTHROSCOPY W/ ROTATOR CUFF REPAIR Right 2012  . SPINAL FUSION  06/2014 - 2018 X ?7   "scoliosis; my entire back"  . TEE WITHOUT CARDIOVERSION N/A 11/21/2019   Procedure: TRANSESOPHAGEAL ECHOCARDIOGRAM (TEE);  Surgeon: Josue Hector, MD;  Location: [facility] ENDOSCOPY;  Service: Cardiovascular;  Laterality: N/A;  . TEE WITHOUT CARDIOVERSION N/A 01/17/2020   Procedure: TRANSESOPHAGEAL ECHOCARDIOGRAM (TEE);  Surgeon: Rexene Alberts, MD;  Location: [place];  Service: Open Heart Surgery;  Laterality: N/A;  . TEE WITHOUT CARDIOVERSION N/A 01/26/2020  Procedure: TRANSESOPHAGEAL ECHOCARDIOGRAM (TEE);  Surgeon: Geralynn Rile, MD;  Location: [place];  Service: Cardiovascular;  Laterality: N/A;  . TUBAL LIGATION    . VAGINAL HYSTERECTOMY     TVH   Past Medical History:  Diagnosis Date  . Anxiety   . Arthritis    "maybe in my back" (03/31/2018)  . Benign paroxysmal positional vertigo 06/08/2013  . Complication of anesthesia   . Fracture of multiple ribs 2015   "don't know from what; dx'd when I in hospital for 1st back OR" (03/31/2018)  . GERD (gastroesophageal reflux disease)   . Hair loss 04/12/2012  . Herpes   . History of blood transfusion    "twice; related to back OR" (03/31/2018)  . History of kidney stones   . Interstitial cystitis 11/06/2011  . 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 Mitral regurgitation   . Osteopenia 02/18/2012  . Osteoporosis   . PAF (paroxysmal atrial fibrillation) ([place]) 2012  . Peripheral neuropathy 11/06/2011  . PMDD (premenstrual dysphoric disorder)   . PONV (postoperative nausea and vomiting)    nausea, vomiting, hives and dizziness   . 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/2020   Complete bilateral atrial lesion set using cryothermy and bipolar radiofrequency ablation with clipping of LA appendage via right mini-thoracotomy  approach  . 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   Complex valvuloplasty including artificial Gore-tex neochord placement x12 with 78mm Sorin Memo 4D ring annuloplasty  . Seasonal allergies   . Vaginal delivery    ONE NSVD  . 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   BP 114/77 Comment: bp reading on 01/04/20  Ht 5\' 6"  (1.676 m)   Wt 120 lb (54.4 kg) Comment: tele vist wt on 01/04/20  BMI 19.37 kg/m   Opioid Risk Score:   Fall Risk Score:  `1  Depression screen PHQ 2/9  Depression screen [facility] 2/9 03/23/2020 03/16/2020 10/18/2019 08/02/2019 03/11/2019 10/21/2018 07/01/2018  Decreased Interest 0 2 0 0 3 0 0  Down, Depressed, Hopeless 0 2 0 0 3 0 0  PHQ - 2 Score 0 4 0 0 6 0 0  Altered sleeping 1 0 - 0 1 0 0  Tired, decreased energy 0 1 - 0 2 0 0  Change in appetite 0 1 - 0 2 0 0  Feeling bad or failure about yourself  0 0 - 0 0 0 0  Trouble concentrating 0 0 - 0 0 0 0  Moving slowly or fidgety/restless 0 0 - 0 0 0 0  Suicidal thoughts 0 0 - 0 0 0 0  PHQ-9 Score 1 6 - 0 11 0 0  Difficult doing work/chores - Somewhat difficult - Not difficult at all Not difficult at all - Not difficult at all  Some recent data might be hidden   Review of Systems  HENT: Negative.   Eyes: Negative.   Respiratory: Negative.   Cardiovascular: Negative.   Gastrointestinal: Positive for constipation.  Endocrine: Negative.   Genitourinary: Negative.   Musculoskeletal: Positive for back pain, gait problem and neck pain. Negative for joint swelling.  Skin: Negative.   Allergic/Immunologic: Negative.   Neurological: Positive for weakness and numbness.  Psychiatric/Behavioral:       Anxiety  All other systems reviewed and are negative.      Objective:   Physical Exam Visit performed via phone.      Assessment & Plan:  [name] is a 74 year old woman who presents for follow-up of debility  after CIR admission  Debility: -Continue RW, home PT and OT. Reviewed notes.   Constipation: -Recommended daily  prunes  Fatigue: -Recommended iron rich foods and discussed options with her -She gets diarrhea with iron supplements  Pain: Continue oxycodone as needed for post-op chest pain and chronic back pain. Discussed use of heat and lidocaine patches.   Anticoagulation: -She has been getting weekly INR checks. She has appropriate follow-ups scheduled.   All questions answered. RTC PRN. 10 minutes spent in discussion of her therapy, her current mobility and limitations, her goals, dietary strategies for her constipation and fatigue, her pain, and confirmation that she is receiving her necessary follow-up medical care.

## 2020-03-26 ENCOUNTER — Ambulatory Visit (INDEPENDENT_AMBULATORY_CARE_PROVIDER_SITE_OTHER): Payer: Medicare Other | Admitting: *Deleted

## 2020-03-26 ENCOUNTER — Other Ambulatory Visit: Payer: Self-pay | Admitting: *Deleted

## 2020-03-26 DIAGNOSIS — Z8679 Personal history of other diseases of the circulatory system: Secondary | ICD-10-CM | POA: Diagnosis not present

## 2020-03-26 DIAGNOSIS — Z7901 Long term (current) use of anticoagulants: Secondary | ICD-10-CM | POA: Diagnosis not present

## 2020-03-26 DIAGNOSIS — F329 Major depressive disorder, single episode, unspecified: Secondary | ICD-10-CM | POA: Diagnosis not present

## 2020-03-26 DIAGNOSIS — M810 Age-related osteoporosis without current pathological fracture: Secondary | ICD-10-CM | POA: Diagnosis not present

## 2020-03-26 DIAGNOSIS — F419 Anxiety disorder, unspecified: Secondary | ICD-10-CM | POA: Diagnosis not present

## 2020-03-26 DIAGNOSIS — F32A Depression, unspecified: Secondary | ICD-10-CM | POA: Diagnosis not present

## 2020-03-26 DIAGNOSIS — I4891 Unspecified atrial fibrillation: Secondary | ICD-10-CM

## 2020-03-26 DIAGNOSIS — I480 Paroxysmal atrial fibrillation: Secondary | ICD-10-CM | POA: Diagnosis not present

## 2020-03-26 DIAGNOSIS — I051 Rheumatic mitral insufficiency: Secondary | ICD-10-CM | POA: Diagnosis not present

## 2020-03-26 DIAGNOSIS — Z9889 Other specified postprocedural states: Secondary | ICD-10-CM

## 2020-03-26 DIAGNOSIS — Z48812 Encounter for surgical aftercare following surgery on the circulatory system: Secondary | ICD-10-CM | POA: Diagnosis not present

## 2020-03-26 DIAGNOSIS — D62 Acute posthemorrhagic anemia: Secondary | ICD-10-CM | POA: Diagnosis not present

## 2020-03-26 LAB — POCT INR: INR: 2.2 (ref 2.0–3.0)

## 2020-03-26 NOTE — Patient Outreach (Signed)
[facility] [facility] Care Management  03/26/2020  [name] Feb 21, 1946 [id number]   [facility] unsuccessful outreach to [facility] engaged patient for complex care   [name] is a 74 year old patient who was referred to [facility] on8/6/21 Referral Source:Victoria Doug Sou [facility] RN CM hospital liaison Referral Reason:Please assign to [place] for complex careanddisease management follow up calls, medication questions/needsandassess for further needs Insurance:NextGen Medicare Last admissionwas 02/02/20 to 02/08/20 For debility Neuropathy Paroxysmal Atrial fibrillation (PAF)  Unsuccessful outreach  No answer. [facility] RN CM left HIPAA [facility] Portability and Accountability Act) compliant voicemail message along with CM's contact info.   Plan: [facility] RN CM sent an unsuccessful outreach letter and scheduled this [name] engaged patient for another call attempt within 7-10 business days  Abron Neddo L. Lavina Hamman, RN, BSN, [place] Coordinator Office number [phone] Main [facility] number [phone] Fax number [phone]

## 2020-03-30 ENCOUNTER — Other Ambulatory Visit: Payer: Self-pay | Admitting: Cardiology

## 2020-03-30 DIAGNOSIS — I051 Rheumatic mitral insufficiency: Secondary | ICD-10-CM | POA: Diagnosis not present

## 2020-03-30 DIAGNOSIS — D62 Acute posthemorrhagic anemia: Secondary | ICD-10-CM | POA: Diagnosis not present

## 2020-03-30 DIAGNOSIS — F329 Major depressive disorder, single episode, unspecified: Secondary | ICD-10-CM | POA: Diagnosis not present

## 2020-03-30 DIAGNOSIS — Z48812 Encounter for surgical aftercare following surgery on the circulatory system: Secondary | ICD-10-CM | POA: Diagnosis not present

## 2020-03-30 DIAGNOSIS — F419 Anxiety disorder, unspecified: Secondary | ICD-10-CM | POA: Diagnosis not present

## 2020-03-30 DIAGNOSIS — F32A Depression, unspecified: Secondary | ICD-10-CM | POA: Diagnosis not present

## 2020-03-30 DIAGNOSIS — I480 Paroxysmal atrial fibrillation: Secondary | ICD-10-CM | POA: Diagnosis not present

## 2020-04-02 ENCOUNTER — Other Ambulatory Visit: Payer: Self-pay | Admitting: *Deleted

## 2020-04-02 ENCOUNTER — Ambulatory Visit (INDEPENDENT_AMBULATORY_CARE_PROVIDER_SITE_OTHER): Payer: Medicare Other | Admitting: Interventional Cardiology

## 2020-04-02 ENCOUNTER — Other Ambulatory Visit: Payer: Self-pay

## 2020-04-02 DIAGNOSIS — I480 Paroxysmal atrial fibrillation: Secondary | ICD-10-CM | POA: Diagnosis not present

## 2020-04-02 DIAGNOSIS — F32A Depression, unspecified: Secondary | ICD-10-CM | POA: Diagnosis not present

## 2020-04-02 DIAGNOSIS — Z5181 Encounter for therapeutic drug level monitoring: Secondary | ICD-10-CM

## 2020-04-02 DIAGNOSIS — F329 Major depressive disorder, single episode, unspecified: Secondary | ICD-10-CM | POA: Diagnosis not present

## 2020-04-02 DIAGNOSIS — F419 Anxiety disorder, unspecified: Secondary | ICD-10-CM | POA: Diagnosis not present

## 2020-04-02 DIAGNOSIS — I051 Rheumatic mitral insufficiency: Secondary | ICD-10-CM | POA: Diagnosis not present

## 2020-04-02 DIAGNOSIS — Z48812 Encounter for surgical aftercare following surgery on the circulatory system: Secondary | ICD-10-CM | POA: Diagnosis not present

## 2020-04-02 DIAGNOSIS — D62 Acute posthemorrhagic anemia: Secondary | ICD-10-CM | POA: Diagnosis not present

## 2020-04-02 LAB — POCT INR: INR: 1.9 — AB (ref 2.0–3.0)

## 2020-04-02 NOTE — Patient Instructions (Signed)
Description   Spoke with Levada Dy from [facility] and instructed for pt to take 3 tablets of warfarin today (3mg ) and then continue taking Warfarin 2 tablets daily except for 1 tablet on Sunday. Recheck INR in 1 week by [facility]. Keep Ensure at 1 per day. Call coumadin clinic for any changes in medications or upcoming procedures. [phone]

## 2020-04-02 NOTE — Patient Outreach (Signed)
[facility]) Care Management  04/02/2020  [name] 1946/04/23 [id number]   [facility] outreach forpost hospital complex carereferral  [name] is a 74 year old patient who was referred to [facility] on 02/10/20 Referral Source:Victoria Doug [facility] RN CM hospital liaison Referral Reason:Please assign to [place] Coordinator for complex careanddisease management follow up calls, medication questions/needsandassess for further needs Insurance:NextGen Medicare Last admission was 02/02/20 to 02/08/20 For debility Neuropathy Paroxysmal Atrial fibrillation (PAF)  Outreach attempt successful to her home number  Patient is able to verify HIPAA ([place]) identifiers Reviewed and addressed the purpose of the follow up call with the patient  Consent: [facility] RN CM reviewed [facility] services with patient. Patient gave verbal consent for services.   Home health Regina Clyne reports she has just finished being visited by her home health staff She reports the home health staff are visiting once a week She reports she has both teleconference and office MD visits  Edema [name] reports she is having some "fluid" and is weighing daily Her weight today was 120 lbs She reports  Her weight has not been more than 3-5 lbs.   Atrial fibrillation During assessment she reports she generally receive most of her information from her MDs and did not have a lot of answers at this time. [facility] RN CM shared with her that it was okay not to know all the answers. She reports not having s/s of Afib she is aware of. [facility] RN CM discussed disease management and learning about atrial fibrillation  Preventive care services Assessment completed and patient reports she has not had her eyes checked but will do so after she "feel better"  [facility] services  [facility] RN CM care coordination and disease management services discussed Discussed the assessment  questions allows [facility] RN CM to determine disease management education needed  Pt assessed to see if she wanted to continue [facility] services and the frequency of services [name] informed [facility] RN CM she preferred outreach q 6 months with [facility] RN CM vs Health Research scientist (medical).  [name] returned a call to [facility] RN CM to updated [facility] RN CM that she prefers [facility] outreach every 3 months vs 6 months. She provided an apology for her statements in the earlier outreach to her  She reports she is "tired" and "my phone is ringing all day"," I sleep well at night" "I sleep at times during the day" [name] reported frequently during the outreach that she had "major surgery", was tired", " I need some time"  [name] was encouraged to get some rest Depression screen [facility] 2/9 04/02/2020 03/23/2020 03/16/2020 10/18/2019 08/02/2019  Decreased Interest 1 0 2 0 0  Down, Depressed, Hopeless 1 0 2 0 0  PHQ - 2 Score 2 0 4 0 0  Altered sleeping 1 1 0 - 0  Tired, decreased energy 1 0 1 - 0  Change in appetite 1 0 1 - 0  Feeling bad or failure about yourself  0 0 0 - 0  Trouble concentrating 0 0 0 - 0  Moving slowly or fidgety/restless 0 0 0 - 0  Suicidal thoughts 0 0 0 - 0  PHQ-9 Score 5 1 6  - 0  Difficult doing work/chores Somewhat difficult - Somewhat difficult - Not difficult at all  Some recent data might be hidden   Anxiety and depression discussed Patient is on treatment plan symptoms  She denies need of [facility] SW services at this time/resources  Plans [facility] RN CM will outreach to [name] as agreed within the next 3 months Pt encouraged to return a call to [facility] RN CM prn Routed note to MD  Goals Addressed              This Visit's Progress     Patient Stated   .  [facility]) Patient will be able to manage her atrial fibrillation at home (pt-stated)   On track     [place] (see longitudinal plan of care for additional care plan information)   Current Barriers:  [name] Knowledge deficit related to basic  atrial fibrillation pathophysiology and self care management . Cognitive Deficits  Case Manager Clinical Goal(s):  [name] Over the next 90 days, patient will verbalize understanding of atrial fibrillation Action Plan and when to call doctor 04/02/20 progressing . Over the next 45 days, patient will take all atrial fibrillation mediations as prescribed- 04/02/20 progressing . Patient will verbalize two symptoms of atrial fibrillation exacerbation (increase chest pain, irregular heart rate, dizziness, weakness, fainting, struggle to breathe, confusion, symptoms of stroke) within the next 30 days. 04/02/20 progressing  Interventions:  . Basic overview and discussion of pathophysiology of atrial fibrillation reviewed  . Provided written education on heart healthy diet . Reviewed atrial fibrillation Action Plan in depth and provided written copy . Review use of anticoagulant . Discussed importance of pcp follow up . Discussed [facility] services and progression . Discussed preventative care  . Provided encouragement  . Afib follow up and preventative assessment  Patient Self Care Activities:  . Takes atrial fibrillation  Medications as prescribed . Verbalizes understanding of and follows atrial fibrillation  Action Plan . Adheres to low sodium diet . Continue to work with [facility] PT  . Visit pcp on 03/13/20  Please see past updates related to this goal by clicking on the "Past Updates" button in the selected goal           Leelyn Jasinski L. Lavina Hamman, RN, BSN, [place] Coordinator Office number [phone] [facility] number [phone] Fax number [phone]

## 2020-04-03 ENCOUNTER — Encounter: Payer: Self-pay | Admitting: Cardiology

## 2020-04-03 ENCOUNTER — Ambulatory Visit (INDEPENDENT_AMBULATORY_CARE_PROVIDER_SITE_OTHER): Payer: Medicare Other | Admitting: Cardiology

## 2020-04-03 VITALS — BP 98/60 | HR 64 | Ht 66.0 in | Wt 119.0 lb

## 2020-04-03 DIAGNOSIS — I4819 Other persistent atrial fibrillation: Secondary | ICD-10-CM | POA: Diagnosis not present

## 2020-04-03 NOTE — Patient Instructions (Signed)
Medication Instructions:  The office will call you with instructions on switching to Xarelto  *If you need a refill on your cardiac medications before your next appointment, please call your pharmacy*   Lab Work: None ordered If you have labs (blood work) drawn today and your tests are completely normal, you will receive your results only by: [name] MyChart Message (if you have MyChart) OR . A paper copy in the mail If you have any lab test that is abnormal or we need to change your treatment, we will call you to review the results.   Testing/Procedures: None ordered   Follow-Up: At [facility], you and your health needs are our priority.  As part of our continuing mission to provide you with exceptional heart care, we have created designated Provider Care Teams.  These Care Teams include your primary Cardiologist (physician) and Advanced Practice Providers (APPs -  Physician Assistants and Nurse Practitioners) who all work together to provide you with the care you need, when you need it.  We recommend signing up for the patient portal called "MyChart".  Sign up information is provided on this After Visit Summary.  MyChart is used to connect with patients for Virtual Visits (Telemedicine).  Patients are able to view lab/test results, encounter notes, upcoming appointments, etc.  Non-urgent messages can be sent to your provider as well.   To learn more about what you can do with MyChart, go to [email or web address].    Your next appointment:   6 month(s)  The format for your next appointment:   In Person  Provider:   Allegra Lai, MD    Thank you for choosing [facility]!!   Trinidad Curet, RN [phone]   Other Instructions

## 2020-04-03 NOTE — Progress Notes (Signed)
Electrophysiology Office Note   Date:  04/03/2020   ID:  [name], [name] 1946-03-18, MRN [id number]  PCP:  [name], MD  Cardiologist:  [name] Primary Electrophysiologist: Regina Alken, MD    No chief complaint on file.    History of Present Illness: [name] is a 74 y.o. female who is being seen today for the evaluation of atrial fibrillation at the request of Regina Riddle [name], MD. Presenting today for electrophysiology evaluation.  She has severe panic and anxiety disorder.  She also has a history of atrial fibrillation and mitral regurgitation.  She has now status post mitral valve replacement.  This was initially planned for a mini mitral valve repair, but due to left ventricular free wall laceration, median sternotomy was performed.  Her hospital course was complicated by the need for median sternotomy.  Postop she had issues with volume overload, acute blood loss anemia from a heme positive stool as well as rapid atrial fibrillation and atrial flutter.  Tikosyn was discontinued and she was started on amiodarone.  Today, denies symptoms of palpitations, chest pain, shortness of breath, orthopnea, PND, lower extremity edema, claudication, dizziness, presyncope, syncope, bleeding, or neurologic sequela. The patient is tolerating medications without difficulties.  Since that time, she complains of shortness of breath and fatigue.  She does say that her symptoms are improving.  She feels that she is smothering.  I told her that it is likely due to the fact that she had a median sternotomy as well as a thoracotomy.  She also has a hydropneumothorax and has plans to see her cardiac surgeon next week.  Past Medical History:  Diagnosis Date  . Anxiety   . Arthritis    "maybe in my back" (03/31/2018)  . Benign paroxysmal positional vertigo 06/08/2013  . Complication of anesthesia   . Fracture of multiple ribs 2015   "don't know from what; dx'd when I in  hospital for 1st back OR" (03/31/2018)  . GERD (gastroesophageal reflux disease)   . Hair loss 04/12/2012  . Herpes   . History of blood transfusion    "twice; related to back OR" (03/31/2018)  . History of kidney stones   . Interstitial cystitis 11/06/2011  . 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 Mitral regurgitation   . Osteopenia 02/18/2012  . Osteoporosis   . PAF (paroxysmal atrial fibrillation) ([place]) 2012  . Peripheral neuropathy 11/06/2011  . PMDD (premenstrual dysphoric disorder)   . PONV (postoperative nausea and vomiting)    nausea, vomiting, hives and dizziness   . 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/2020   Complete bilateral atrial lesion set using cryothermy and bipolar radiofrequency ablation with clipping of LA appendage via right mini-thoracotomy approach  . 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 Seasonal allergies   . Vaginal delivery    ONE NSVD  . 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 ANTERIOR CERVICAL DECOMP/DISCECTOMY FUSION  ~ 2003  . BACK SURGERY    . BREAST SURGERY     BREAST BIOPSY--RIGHT BENIGN  . BUNIONECTOMY Bilateral   . CARDIOVERSION N/A 01/26/2020   Procedure: CARDIOVERSION;  Surgeon: Geralynn Rile, MD;  Location: [place];  Service: Cardiovascular;  Laterality: N/A;  . COSMETIC SURGERY  2016   "back of my neck; related to earlier fusion"  .  CYSTOSCOPY W/ STONE MANIPULATION  "several times"  . DILATION AND CURETTAGE OF UTERUS    . FOREHEAD RECONSTRUCTION Right    "removed bone protruding out of my forehead"  . HARDWARE REMOVAL  2016   "related to neck OR"  . INCONTINENCE SURGERY    . IR THORACENTESIS ASP PLEURAL SPACE W/IMG GUIDE  02/16/2020  . MINIMALLY INVASIVE MAZE PROCEDURE N/A 01/17/2020   Procedure: MINIMALLY INVASIVE MAZE PROCEDURE;  Surgeon: Rexene Alberts,  MD;  Location: [place];  Service: Open Heart Surgery;  Laterality: N/A;  . MITRAL VALVE REPAIR Right 01/17/2020   Procedure: MINIMALLY INVASIVE MITRAL VALVE REPAIR (MVR) USING MEMO 4D 32MM;  Surgeon: Rexene Alberts, MD;  Location: [place];  Service: Open Heart Surgery;  Laterality: Right;  . POSTERIOR CERVICAL FUSION/FORAMINOTOMY  ~ 2008; 2015  . RIGHT/LEFT HEART CATH AND CORONARY ANGIOGRAPHY N/A 12/02/2019   Procedure: RIGHT/LEFT HEART CATH AND CORONARY ANGIOGRAPHY;  Surgeon: Burnell Blanks, MD;  Location: [place] CV LAB;  Service: Cardiovascular;  Laterality: N/A;  . SHOULDER ARTHROSCOPY W/ ROTATOR CUFF REPAIR Right 2012  . SPINAL FUSION  06/2014 - 2018 X ?7   "scoliosis; my entire back"  . TEE WITHOUT CARDIOVERSION N/A 11/21/2019   Procedure: TRANSESOPHAGEAL ECHOCARDIOGRAM (TEE);  Surgeon: Josue Hector, MD;  Location: [facility] ENDOSCOPY;  Service: Cardiovascular;  Laterality: N/A;  . TEE WITHOUT CARDIOVERSION N/A 01/17/2020   Procedure: TRANSESOPHAGEAL ECHOCARDIOGRAM (TEE);  Surgeon: Rexene Alberts, MD;  Location: [place];  Service: Open Heart Surgery;  Laterality: N/A;  . TEE WITHOUT CARDIOVERSION N/A 01/26/2020   Procedure: TRANSESOPHAGEAL ECHOCARDIOGRAM (TEE);  Surgeon: Geralynn Rile, MD;  Location: [place];  Service: Cardiovascular;  Laterality: N/A;  . TUBAL LIGATION    . VAGINAL HYSTERECTOMY     TVH     Current Outpatient Medications  Medication Sig Dispense Refill  . acetaminophen (TYLENOL) 500 MG tablet Take 500 mg by mouth every 6 (six) hours as needed for moderate pain.     [name] amiodarone (PACERONE) 200 MG tablet 200 mg daily.     [name] aspirin EC 81 MG tablet Take 1 tablet (81 mg total) by mouth daily. Swallow whole. 150 tablet 2  . b complex vitamins tablet Take 1 tablet by mouth daily.    . Calcium Carbonate Antacid (TUMS CHEWY BITES PO) Take 1 tablet by mouth daily as needed (reflux).     . Calcium Citrate-Vitamin D (CALCIUM + D PO) Take 1 tablet by mouth daily.     . Carboxymethylcellul-Glycerin (LUBRICATING EYE DROPS OP) Place 1 drop into both eyes daily as needed (dry eyes).    . clonazePAM (KLONOPIN) 0.5 MG tablet Take 1 tablet (0.5 mg total) by mouth 2 (two) times daily as needed for anxiety. 30 tablet 3  . cyclobenzaprine (FLEXERIL) 10 MG tablet Take 10 mg by mouth at bedtime as needed for muscle spasms.   5  . denosumab (PROLIA) 60 MG/ML SOSY injection Inject 60 mg into the skin every 6 (six) months.    . famotidine (PEPCID) 20 MG tablet Take 1 tablet (20 mg total) by mouth daily. (Patient taking differently: Take 20 mg by mouth daily as needed for heartburn or indigestion. ) 90 tablet 1  . ferrous [id number] C-folic acid (TRINSICON / FOLTRIN) capsule Take 1 capsule by mouth 2 (two) times daily after a meal. 60 capsule 0  . fluticasone (FLONASE) 50 MCG/ACT nasal spray Place 1 spray into both nostrils daily as needed for allergies or rhinitis.    [name]  furosemide (LASIX) 40 MG tablet Take 1 tablet (40 mg total) by mouth daily. 90 tablet 2  . gabapentin (NEURONTIN) 100 MG capsule Take 1 capsule (100 mg total) by mouth 3 (three) times daily as needed (pain.). 90 capsule 0  . lactulose (CONSTULOSE) 10 GM/15ML solution Take 20 g by mouth daily as needed for moderate constipation (constipation.).     [name] levocetirizine (XYZAL) 5 MG tablet Take 5 mg by mouth daily as needed for allergies.     . magnesium oxide (MAG-OX) 400 (241.3 Mg) MG tablet Take 0.5 tablets (200 mg total) by mouth daily. 30 tablet 0  . melatonin 3 MG TABS tablet Take 1 tablet (3 mg total) by mouth at bedtime as needed (insomnia). 30 tablet 0  . metoprolol tartrate (LOPRESSOR) 50 MG tablet Take 1 tablet (50 mg total) by mouth 2 (two) times daily. 60 tablet 3  . Nutritional Supplements (,FEEDING SUPPLEMENT, PROSOURCE PLUS) liquid Take 30 mLs by mouth 2 (two) times daily between meals. 887 mL 0  . nystatin (MYCOSTATIN/NYSTOP) powder Apply topically 2 (two) times daily as needed (skin  irritation (under breasts)). 15 g 0  . ondansetron (ZOFRAN) 4 MG tablet Take 1 tablet (4 mg total) by mouth every 8 (eight) hours as needed for nausea or vomiting. 30 tablet 1  . oxyCODONE (ROXICODONE) 5 MG/5ML solution Take 4 mLs (4 mg total) by mouth every 4 (four) hours as needed for moderate pain.  0  . pantoprazole (PROTONIX) 40 MG tablet Take 1 tablet (40 mg total) by mouth daily. 30 tablet 1  . polyethylene glycol (MIRALAX / GLYCOLAX) packet Take 17 g by mouth daily as needed for mild constipation.    . Potassium 99 MG TABS 1 tablet    . potassium chloride (KLOR-CON) 10 MEQ tablet Take 2 tablets (20 mEq total) by mouth daily. 60 tablet 9  . sodium chloride (OCEAN) 0.65 % SOLN nasal spray Place 1 spray into both nostrils as needed for congestion (nose bleeds).    . valACYclovir (VALTREX) 500 MG tablet Take 500 mg by mouth daily as needed (breakouts).     . warfarin (COUMADIN) 1 MG tablet TAKE 1 TO 2 TABLETS BY MOUTH ONCE DAILY AS  DIRECTED  BY  COUMADIN  CLINIC 55 tablet 1   No current facility-administered medications for this visit.    Allergies:   Buprenorphine, Contrast media [iodinated diagnostic agents], Cymbalta [duloxetine hcl], Erythromycin, Hydrocodone, Hydromorphone, Levofloxacin, Metrizamide, Nsaids, Nucynta [tapentadol hcl], Nucynta [tapentadol], Other, Oxycodone, Pentazocine, Septra [bactrim], Sulfa antibiotics, Sulfasalazine, Adhesive [tape], Latex, and Morphine   Social History:  The patient  reports that she has quit smoking. Her smoking use included cigarettes. She has a 1.40 pack-year smoking history. She has never used smokeless tobacco. She reports current drug use. Drugs: Oxycodone and Benzodiazepines. She reports that she does not drink alcohol.   Family History:  The patient's family history includes Arthritis in her mother; Diabetes in her brother, brother, and father; Heart Problems in her brother; Heart disease in her mother, sister, and sister; Hyperlipidemia in  her sister and sister; Stroke in her sister; Uterine cancer in her mother.    ROS:  Please see the history of present illness.   Otherwise, review of systems is positive for none.   All other systems are reviewed and negative.   PHYSICAL EXAM: VS:  BP 98/60   Pulse 64   Ht 5\' 6"  (1.676 m)   Wt 119 lb (54 kg)   SpO2 99%  BMI 19.21 kg/m  , BMI Body mass index is 19.21 kg/m. GEN: Well nourished, well developed, in no acute distress  HEENT: normal  Neck: no JVD, carotid bruits, or masses Cardiac: RRR; no murmurs, rubs, or gallops,no edema  Respiratory:  clear to auscultation bilaterally, normal work of breathing GI: soft, nontender, nondistended, + BS MS: no deformity or atrophy  Skin: warm and dry Neuro:  Strength and sensation are intact Psych: euthymic mood, full affect  EKG:  EKG is ordered today. Personal review of the ekg ordered shows sinus rhythm  Recent Labs: 01/27/2020: TSH 2.818 02/03/2020: ALT 20; Magnesium 1.9 02/06/2020: BUN 12; Creatinine, Ser 0.76; Hemoglobin 9.7; Platelets 355; Potassium 4.4; Sodium 139    Lipid Panel     Component Value Date/Time   CHOL 237 (H) 09/23/2017 1420   TRIG 101.0 09/23/2017 1420   HDL 100.90 09/23/2017 1420   CHOLHDL 2 09/23/2017 1420   VLDL 20.2 09/23/2017 1420   LDLCALC 116 (H) 09/23/2017 1420   LDLDIRECT 136.5 06/08/2013 1049     Wt Readings from Last 3 Encounters:  04/03/20 119 lb (54 kg)  03/23/20 120 lb (54.4 kg)  03/16/20 120 lb 8 oz (54.7 kg)      Other studies Reviewed: Additional studies/ records that were reviewed today include: TTE 07/25/2019 Review of the above records today demonstrates:  1. Left ventricular ejection fraction, by visual estimation, is 60 to 65%. The left ventricle has normal function. There is no left ventricular hypertrophy.  2. The left ventricle has no regional wall motion abnormalities.  3. Global right ventricle has normal systolic function.The right ventricular size is normal. No  increase in right ventricular wall thickness.  4. Left atrial size was mildly dilated.  5. Right atrial size was normal.  6. The mitral valve is myxomatous. Moderate mitral valve regurgitation. No evidence of mitral stenosis.  7. Bi leaflet prolapse with myxomatous leaflets. Worse in anterior leaflet Posteriorly directed jet poorly characterized by color flow but at least moderate. Can consider TEE if clinically indicated to further characterize.  8. The tricuspid valve is normal in structure.  9. The tricuspid valve is normal in structure. Tricuspid valve regurgitation is mild. 10. The aortic valve is tricuspid. Aortic valve regurgitation is mild. Mild aortic valve sclerosis without stenosis. 11. Pulmonic regurgitation is mild. 12. The pulmonic valve was normal in structure. Pulmonic valve regurgitation is mild. 13. Mildly elevated pulmonary artery systolic pressure. 14. The inferior vena cava is normal in size with greater than 50% respiratory variability, suggesting right atrial pressure of 3 mmHg.   ASSESSMENT AND PLAN:  1.  Paroxysmal atrial fibrillation: Currently on amiodarone (high risk medication monitoring) and warfarin.  ECG monitoring for high risk medication.  CHA2DS2-VASc of 3.  She fortunately remains in sinus rhythm.  We Lashawn Orrego continue amiodarone and stop her warfarin.  We Vonnie Ligman start her on Xarelto.   2.  Hypertension: Currently well controlled  3.  Severe mitral regurgitation: Status post mitral valve repair.  Most recent echo shows stable valve.  No changes.  We Shondell Fabel refer her to general cardiology to follow her mitral valve.  Current medicines are reviewed at length with the patient today.   The patient does not have concerns regarding her medicines.  The following changes were made today: Stop warfarin, start Xarelto  Labs/ tests ordered today include:  Orders Placed This Encounter  Procedures  . EKG 12-Lead     Disposition:   FU with Sammantha Mehlhaff 6  months  Signed, Jese Comella Meredith Leeds, MD  04/03/2020 3:22 PM     [place]  [phone] (office) [phone] (fax)

## 2020-04-04 ENCOUNTER — Other Ambulatory Visit: Payer: Self-pay | Admitting: Cardiology

## 2020-04-04 ENCOUNTER — Telehealth: Payer: Self-pay | Admitting: Cardiology

## 2020-04-04 DIAGNOSIS — Z48812 Encounter for surgical aftercare following surgery on the circulatory system: Secondary | ICD-10-CM | POA: Diagnosis not present

## 2020-04-04 DIAGNOSIS — D62 Acute posthemorrhagic anemia: Secondary | ICD-10-CM | POA: Diagnosis not present

## 2020-04-04 DIAGNOSIS — F32A Depression, unspecified: Secondary | ICD-10-CM | POA: Diagnosis not present

## 2020-04-04 DIAGNOSIS — F329 Major depressive disorder, single episode, unspecified: Secondary | ICD-10-CM | POA: Diagnosis not present

## 2020-04-04 DIAGNOSIS — I480 Paroxysmal atrial fibrillation: Secondary | ICD-10-CM | POA: Diagnosis not present

## 2020-04-04 DIAGNOSIS — F419 Anxiety disorder, unspecified: Secondary | ICD-10-CM | POA: Diagnosis not present

## 2020-04-04 DIAGNOSIS — I051 Rheumatic mitral insufficiency: Secondary | ICD-10-CM | POA: Diagnosis not present

## 2020-04-04 MED ORDER — RIVAROXABAN 20 MG PO TABS: 20.0000 mg | ORAL_TABLET | Freq: Every day | ORAL | 6 refills | Status: DC

## 2020-04-04 NOTE — Telephone Encounter (Signed)
Pt advised to start Xarelto tonight at dinner, stop Coumadin. Patient verbalized understanding and agreeable to plan.

## 2020-04-04 NOTE — Telephone Encounter (Signed)
Patient is calling stating that [name] was going to call her yesterday or today to tell her if she was to stop taking her coumadin.  She would like a call to let her know.

## 2020-04-09 ENCOUNTER — Ambulatory Visit (INDEPENDENT_AMBULATORY_CARE_PROVIDER_SITE_OTHER): Payer: Self-pay | Admitting: Thoracic Surgery (Cardiothoracic Vascular Surgery)

## 2020-04-09 ENCOUNTER — Other Ambulatory Visit: Payer: Self-pay

## 2020-04-09 ENCOUNTER — Encounter: Payer: Self-pay | Admitting: Thoracic Surgery (Cardiothoracic Vascular Surgery)

## 2020-04-09 ENCOUNTER — Telehealth: Payer: Self-pay | Admitting: Cardiology

## 2020-04-09 ENCOUNTER — Ambulatory Visit
Admission: RE | Admit: 2020-04-09 | Discharge: 2020-04-09 | Disposition: A | Discharge status: Home / Self Care | Payer: Medicare Other | Source: Ambulatory Visit | Attending: Thoracic Surgery (Cardiothoracic Vascular Surgery) | Admitting: Thoracic Surgery (Cardiothoracic Vascular Surgery)

## 2020-04-09 VITALS — BP 143/80 | HR 80 | Resp 20 | Ht 66.0 in | Wt 119.8 lb

## 2020-04-09 DIAGNOSIS — F329 Major depressive disorder, single episode, unspecified: Secondary | ICD-10-CM | POA: Diagnosis not present

## 2020-04-09 DIAGNOSIS — F32A Depression, unspecified: Secondary | ICD-10-CM | POA: Diagnosis not present

## 2020-04-09 DIAGNOSIS — I517 Cardiomegaly: Secondary | ICD-10-CM | POA: Diagnosis not present

## 2020-04-09 DIAGNOSIS — Z48812 Encounter for surgical aftercare following surgery on the circulatory system: Secondary | ICD-10-CM | POA: Diagnosis not present

## 2020-04-09 DIAGNOSIS — Z981 Arthrodesis status: Secondary | ICD-10-CM | POA: Diagnosis not present

## 2020-04-09 DIAGNOSIS — I480 Paroxysmal atrial fibrillation: Secondary | ICD-10-CM | POA: Diagnosis not present

## 2020-04-09 DIAGNOSIS — J90 Pleural effusion, not elsewhere classified: Secondary | ICD-10-CM

## 2020-04-09 DIAGNOSIS — F419 Anxiety disorder, unspecified: Secondary | ICD-10-CM | POA: Diagnosis not present

## 2020-04-09 DIAGNOSIS — Z8679 Personal history of other diseases of the circulatory system: Secondary | ICD-10-CM

## 2020-04-09 DIAGNOSIS — Z9889 Other specified postprocedural states: Secondary | ICD-10-CM

## 2020-04-09 DIAGNOSIS — D62 Acute posthemorrhagic anemia: Secondary | ICD-10-CM | POA: Diagnosis not present

## 2020-04-09 DIAGNOSIS — I051 Rheumatic mitral insufficiency: Secondary | ICD-10-CM | POA: Diagnosis not present

## 2020-04-09 NOTE — Patient Instructions (Addendum)
Continue all previous medications without any changes at this time  Check your weight on a regular basis and keep a log for your records.  Look for signs of fluid overload such as worsening swelling of your lower legs, increased shortness of breath with activity, and/or a dry nonproductive cough.  Discussed these findings with your cardiologist including whether or not you should adjust your fluid pill dosage (diuretic).  Discuss how long you should remain on amiodarone with [name] and/or Dr. Blima Singer may resume unrestricted physical activity without any particular limitations at this time.  Endocarditis is a potentially serious infection of heart valves or inside lining of the heart.  It occurs more commonly in patients with diseased heart valves (such as patient's with aortic or mitral valve disease) and in patients who have undergone heart valve repair or replacement.  Certain surgical and dental procedures may put you at risk, such as dental cleaning, other dental procedures, or any surgery involving the respiratory, urinary, gastrointestinal tract, gallbladder or prostate gland.   To minimize your chances for develooping endocarditis, maintain good oral health and seek prompt medical attention for any infections involving the mouth, teeth, gums, skin or urinary tract.    Always notify your doctor or dentist about your underlying heart valve condition before having any invasive procedures. You will need to take antibiotics before certain procedures, including all routine dental cleanings or other dental procedures.  Your cardiologist or dentist should prescribe these antibiotics for you to be taken ahead of time.

## 2020-04-09 NOTE — Progress Notes (Signed)
[address]       ,[place] [address]             [phone]     CARDIOTHORACIC SURGERY OFFICE NOTE  Referring Provider is Josue Hector, MD Electrophysiologist is Constance Haw, MD PCP is [name], MD   HPI:  Patient is a 74 year old female with history of mitral regurgitation, recurrent paroxysmal atrial fibrillation on long-term anticoagulation, severe scoliosis for which she has undergone numerous surgical procedures, chronic pain on long-term oral narcotics, severe chronic anxiety and panic disorder, osteoporosis, degenerative arthritis, GE reflux disease, and peripheral neuropathy who returns to the office today for follow-up nearly 3 months status post mitral valve repair and Maze procedure on January 17, 2020.  Her surgical procedure was initially performed via right minithoracotomy approach but converted to median sternotomy due to intraoperative laceration of left ventricular free wall.  Her early postoperative recovery in the hospital was slow and notable for the development of recurrent atrial fibrillation for which she ultimately underwent DC cardioversion.  She later developed symptomatic right pleural effusion for which she underwent therapeutic thoracentesis on February 16, 2020. She was recently seen in follow-up by [name] in the atrial fibrillation clinic where she was maintaining sinus rhythm. Coumadin was stopped at that time and she was placed back on Xarelto for long-term anticoagulation. She returns her office today for routine follow-up. Overall she reports that she feels much better than she did prior to her surgery. However, she still feels limited by symptoms of exertional shortness of breath. She does admit that her breathing is considerably better than it had been in the past, but she wonders whether or not she will always have some degree of shortness of breath. She still has some lower extremity edema. She has not been  checking her weight on a regular basis. She is walking some although she uses a walker for support. She states that her legs feel weak and heavy. She remains very anxious about returning home to more independent lifestyle. She no longer has any pain related to her sternotomy incision although she still has some mild discomfort in the right lateral chest wall related to her minithoracotomy. She states that breathing seems to get worse when she lays flat in bed but she is comfortable sleeping for the most part. She has not had fevers, chills, nor productive cough. Appetite is fair. She has not had any palpitations or other symptoms to suggest a recurrence of atrial fibrillation.  Current Outpatient Medications  Medication Sig Dispense Refill  . acetaminophen (TYLENOL) 500 MG tablet Take 500 mg by mouth every 6 (six) hours as needed for moderate pain.     [name] amiodarone (PACERONE) 200 MG tablet 200 mg daily.     [name] aspirin EC 81 MG tablet Take 1 tablet (81 mg total) by mouth daily. Swallow whole. 150 tablet 2  . b complex vitamins tablet Take 1 tablet by mouth daily.    . Calcium Carbonate Antacid (TUMS CHEWY BITES PO) Take 1 tablet by mouth daily as needed (reflux).     . Calcium Citrate-Vitamin D (CALCIUM + D PO) Take 1 tablet by mouth daily.    . Carboxymethylcellul-Glycerin (LUBRICATING EYE DROPS OP) Place 1 drop into both eyes daily as needed (dry eyes).    . clonazePAM (KLONOPIN) 0.5 MG tablet Take 1 tablet (0.5 mg total) by mouth 2 (two) times daily as needed for anxiety. 30 tablet 3  .  cyclobenzaprine (FLEXERIL) 10 MG tablet Take 10 mg by mouth at bedtime as needed for muscle spasms.   5  . denosumab (PROLIA) 60 MG/ML SOSY injection Inject 60 mg into the skin every 6 (six) months.    . fluticasone (FLONASE) 50 MCG/ACT nasal spray Place 1 spray into both nostrils daily as needed for allergies or rhinitis.    . furosemide (LASIX) 40 MG tablet Take 1 tablet (40 mg total) by mouth daily. 90 tablet 2  .  gabapentin (NEURONTIN) 100 MG capsule Take 1 capsule (100 mg total) by mouth 3 (three) times daily as needed (pain.). 90 capsule 0  . lactulose (CONSTULOSE) 10 GM/15ML solution Take 20 g by mouth daily as needed for moderate constipation (constipation.).     [name] levocetirizine (XYZAL) 5 MG tablet Take 5 mg by mouth daily as needed for allergies.     . melatonin 3 MG TABS tablet Take 1 tablet (3 mg total) by mouth at bedtime as needed (insomnia). 30 tablet 0  . metoprolol tartrate (LOPRESSOR) 50 MG tablet Take 1 tablet (50 mg total) by mouth 2 (two) times daily. 60 tablet 3  . Nutritional Supplements (,FEEDING SUPPLEMENT, PROSOURCE PLUS) liquid Take 30 mLs by mouth 2 (two) times daily between meals. 887 mL 0  . nystatin (MYCOSTATIN/NYSTOP) powder Apply topically 2 (two) times daily as needed (skin irritation (under breasts)). 15 g 0  . ondansetron (ZOFRAN) 4 MG tablet Take 1 tablet (4 mg total) by mouth every 8 (eight) hours as needed for nausea or vomiting. 30 tablet 1  . oxyCODONE (ROXICODONE) 5 MG/5ML solution Take 4 mLs (4 mg total) by mouth every 4 (four) hours as needed for moderate pain.  0  . pantoprazole (PROTONIX) 40 MG tablet Take 1 tablet (40 mg total) by mouth daily. 30 tablet 1  . polyethylene glycol (MIRALAX / GLYCOLAX) packet Take 17 g by mouth daily as needed for mild constipation.    . rivaroxaban (XARELTO) 20 MG TABS tablet Take 1 tablet (20 mg total) by mouth daily with supper. 30 tablet 6  . sodium chloride (OCEAN) 0.65 % SOLN nasal spray Place 1 spray into both nostrils as needed for congestion (nose bleeds).    . valACYclovir (VALTREX) 500 MG tablet Take 500 mg by mouth daily as needed (breakouts).     . famotidine (PEPCID) 20 MG tablet Take 1 tablet (20 mg total) by mouth daily. (Patient not taking: Reported on 04/09/2020) 90 tablet 1  . ferrous [id number] C-folic acid (TRINSICON / FOLTRIN) capsule Take 1 capsule by mouth 2 (two) times daily after a meal. (Patient not  taking: Reported on 04/09/2020) 60 capsule 0  . magnesium oxide (MAG-OX) 400 (241.3 Mg) MG tablet Take 0.5 tablets (200 mg total) by mouth daily. (Patient not taking: Reported on 04/09/2020) 30 tablet 0  . Potassium 99 MG TABS 1 tablet (Patient not taking: Reported on 04/09/2020)    . potassium chloride (KLOR-CON) 10 MEQ tablet Take 2 tablets (20 mEq total) by mouth daily. (Patient not taking: Reported on 04/09/2020) 60 tablet 9   No current facility-administered medications for this visit.      Physical Exam:   BP (!) 143/80   Pulse 80   Resp 20   Ht 5\' 6"  (1.676 m)   Wt 119 lb 12.8 oz (54.3 kg)   SpO2 90% Comment: RA with mask on  BMI 19.34 kg/m   General:  Anxious but well-appearing  Chest:   Clear to auscultation with slightly diminished  breath sounds right lung base  CV:   Regular rate and rhythm without murmur  Incisions:  Completely healed, sternum is stable  Abdomen:  Soft nontender  Extremities:  Warm and well-perfused, mild bilateral lower extremity edema  Diagnostic Tests:  Two channel telemetry rhythm strip demonstrates normal sinus rhythm     ECHOCARDIOGRAM REPORT       Patient Name:  [name]  Date of Exam: 02/29/2020  Medical Rec #: [id number]   Height:    66.0 in  Accession #:  [id number]  Weight:    125.6 lb  Date of Birth: [birth year]   BSA:     1.641 m  Patient Age:  55 years   BP:      98/51 mmHg  Patient Gender: F       HR:      71 bpm.  Exam Location: [facility]   Procedure: 2D Echo, Cardiac Doppler, Color Doppler and Intracardiac       Opacification Agent   Indications:  Z98.89 MV repair    History:    Patient has prior history of Echocardiogram examinations,  most         recent 01/26/2020. Mitral Valve Disease, Arrythmias:Atrial         Fibrillation and Tachycardia.; Risk Factors:Former Smoker  and         Dyslipidemia. Anemia. Maze procedure.            Mitral Valve: 32 mm Sorin Memo 4D ring annuloplasty valve  is         present in the mitral position. Procedure Date: 01/17/20.    Sonographer:  Basilia Jumbo  Referring Phys: [place] Comments: Technically difficult study due to poor echo  windows, suboptimal apical window and suboptimal subcostal window.  IMPRESSIONS    1. There has been no change since the last study on 01/26/2020. Normal  LVEF 60-65% and normal transmitral gradients, mean 4 mmHg.  2. Left ventricular ejection fraction, by estimation, is 60 to 65%. The  left ventricle has normal function. The left ventricle has no regional  wall motion abnormalities. Left ventricular diastolic function could not  be evaluated.  3. Right ventricular systolic function is normal. The right ventricular  size is normal. There is mildly elevated pulmonary artery systolic  pressure. The estimated right ventricular systolic pressure is 99.8 mmHg.  4. The mitral valve has been repaired/replaced. Trivial mitral valve  regurgitation. No evidence of mitral stenosis. The mean mitral valve  gradient is 3.0 mmHg. There is a 32 mm Sorin Memo 4D ring annuloplasty  present in the mitral position. Procedure  Date: 01/17/20.  5. Tricuspid valve regurgitation is mild to moderate.  6. The aortic valve is normal in structure. Aortic valve regurgitation is  trivial. No aortic stenosis is present.  7. The inferior vena cava is normal in size with greater than 50%  respiratory variability, suggesting right atrial pressure of 3 mmHg.   Comparison(s): TEE 01/26/20 EF 55-60%. MV 21mmHg mean PG.   FINDINGS  Left Ventricle: Left ventricular ejection fraction, by estimation, is 60  to 65%. The left ventricle has normal function. The left ventricle has no  regional wall motion abnormalities. Definity contrast agent was given IV  to delineate the left ventricular  endocardial borders. The left  ventricular internal cavity size was normal  in size. There is no left ventricular hypertrophy. Left ventricular  diastolic function could not be evaluated due to mitral  valve repair. Left  ventricular diastolic function could  not be evaluated.   Right Ventricle: The right ventricular size is normal. No increase in  right ventricular wall thickness. Right ventricular systolic function is  normal. There is mildly elevated pulmonary artery systolic pressure. The  tricuspid regurgitant velocity is 2.88  m/s, and with an assumed right atrial pressure of 8 mmHg, the estimated  right ventricular systolic pressure is 37.3 mmHg.   Left Atrium: Left atrial size was normal in size.   Right Atrium: Right atrial size was normal in size.   Pericardium: There is no evidence of pericardial effusion.   Mitral Valve: The mitral valve has been repaired/replaced. Normal mobility  of the mitral valve leaflets. Trivial mitral valve regurgitation. There is  a 32 mm Sorin Memo 4D ring annuloplasty present in the mitral position.  Procedure Date: 01/17/20. No  evidence of mitral valve stenosis. MV peak gradient, 9.9 mmHg. The mean  mitral valve gradient is 3.0 mmHg.   Tricuspid Valve: The tricuspid valve is normal in structure. Tricuspid  valve regurgitation is mild to moderate. No evidence of tricuspid  stenosis.   Aortic Valve: The aortic valve is normal in structure. Aortic valve  regurgitation is trivial. No aortic stenosis is present.   Pulmonic Valve: The pulmonic valve was normal in structure. Pulmonic valve  regurgitation is mild. No evidence of pulmonic stenosis.   Aorta: The aortic root is normal in size and structure.   Venous: The inferior vena cava is normal in size with greater than 50%  respiratory variability, suggesting right atrial pressure of 3 mmHg.   IAS/Shunts: No atrial level shunt detected by color flow Doppler.     LEFT VENTRICLE  PLAX 2D  LVIDd:     4.00 cm  Diastology  LVIDs:     2.70 cm LV e' lateral:  7.13 cm/s  LV PW:     1.20 cm LV E/e' lateral: 22.6  LV IVS:    0.80 cm LV e' medial:  4.13 cm/s  LVOT diam:   2.10 cm LV E/e' medial: 39.0  LV SV:     66  LV SV Index:  40  LVOT Area:   3.46 cm     RIGHT VENTRICLE  RV Basal diam: 3.90 cm  RV S prime:   5.18 cm/s  TAPSE (M-mode): 1.5 cm  RVSP:      41.2 mmHg   LEFT ATRIUM      Index    RIGHT ATRIUM      Index  LA diam:   3.30 cm 2.01 cm/m RA Pressure: 8.00 mmHg  LA Vol (A4C): 27.3 ml 16.64 ml/m RA Area:   16.50 cm                  RA Volume:  40.20 ml 24.50 ml/m  AORTIC VALVE  LVOT Vmax:  90.30 cm/s  LVOT Vmean: 64.150 cm/s  LVOT VTI:  0.191 m    AORTA  Ao Root diam: 3.30 cm  Ao Asc diam: 3.60 cm   MITRAL VALVE        TRICUSPID VALVE               TR Peak grad:  33.2 mmHg  MV Peak grad: 9.9 mmHg   TR Vmax:    288.00 cm/s  MV Mean grad: 3.0 mmHg   Estimated RAP: 8.00 mmHg  MV Vmax:    1.57 m/s   RVSP:      41.2 mmHg  MV Vmean:   72.6 cm/s  MV Decel Time: 201 msec   SHUNTS  MV E velocity: 161.00 cm/s Systemic VTI: 0.19 m  MV A velocity: 32.70 cm/s  Systemic Diam: 2.10 cm  MV E/A ratio: 4.92   Ena Dawley MD  Electronically signed by Ena Dawley MD  Signature Date/Time: 02/29/2020/3:16:13 PM       CHEST - 2 VIEW  COMPARISON:  March 15, 2020.  FINDINGS: Stable cardiomegaly. No pneumothorax is noted. Left lung is clear. Stable mild right pleural effusion is noted with associated right basilar atelectasis or infiltrate. Status post surgical posterior fusion of thoracic spine.  IMPRESSION: Stable mild right pleural effusion with associated right basilar atelectasis or infiltrate.   Electronically Signed   By: Marijo Conception M.D.   On: 04/09/2020 09:50   Impression:  Patient seems to be doing  remarkably well and maintaining sinus rhythm nearly 3 months status post mitral valve repair and Maze procedure. I have personally reviewed her most recent follow-up transthoracic echocardiogram which demonstrates intact mitral valve repair with no significant residual mitral regurgitation and normal left ventricular systolic function. Follow-up chest x-ray performed today demonstrates stable radiographic appearance with trivial residual right pleural effusion.    Plan:  We have not recommended any change the patient's current medications. I think it would be reasonable to consider stopping amiodarone at some point in the not too distant future. However, we will leave this to Dr. Macky Lower discretion. I have encouraged the patient to continue to increase her physical activity without any particular limitations. I have reminded her that it could take many months or as long as a year for her to completely recover. I have also reminded her that she will likely continue to have some symptoms related to chronic diastolic congestive heart failure and she may need to remain on some type of diuretic therapy indefinitely.  The patient has been reminded regarding the importance of dental hygiene and the lifelong need for antibiotic prophylaxis for all dental cleanings and other related invasive procedures. She will continue to follow along intermittently with [name] and Dr. Johnsie Cancel. She will return to our office for routine follow-up next summer, approximately 1 year following her surgery. She will call and return sooner should specific problems or questions arise.     Valentina Gu. Roxy Manns, MD 04/09/2020 10:29 AM

## 2020-04-09 NOTE — Telephone Encounter (Signed)
She is getting in touch with [place] about amiodarone (PACERONE) 200 MG tablet dosage.  Patient went to go see her surgeon Dr. Ricard Dillon today.

## 2020-04-10 ENCOUNTER — Telehealth: Payer: Self-pay | Admitting: Family Medicine

## 2020-04-10 DIAGNOSIS — G8929 Other chronic pain: Secondary | ICD-10-CM | POA: Diagnosis not present

## 2020-04-10 DIAGNOSIS — H8110 Benign paroxysmal vertigo, unspecified ear: Secondary | ICD-10-CM | POA: Diagnosis not present

## 2020-04-10 DIAGNOSIS — K5900 Constipation, unspecified: Secondary | ICD-10-CM | POA: Diagnosis not present

## 2020-04-10 DIAGNOSIS — F329 Major depressive disorder, single episode, unspecified: Secondary | ICD-10-CM | POA: Diagnosis not present

## 2020-04-10 DIAGNOSIS — I051 Rheumatic mitral insufficiency: Secondary | ICD-10-CM | POA: Diagnosis not present

## 2020-04-10 DIAGNOSIS — Z5181 Encounter for therapeutic drug level monitoring: Secondary | ICD-10-CM | POA: Diagnosis not present

## 2020-04-10 DIAGNOSIS — Z48812 Encounter for surgical aftercare following surgery on the circulatory system: Secondary | ICD-10-CM | POA: Diagnosis not present

## 2020-04-10 DIAGNOSIS — M810 Age-related osteoporosis without current pathological fracture: Secondary | ICD-10-CM | POA: Diagnosis not present

## 2020-04-10 DIAGNOSIS — M8580 Other specified disorders of bone density and structure, unspecified site: Secondary | ICD-10-CM | POA: Diagnosis not present

## 2020-04-10 DIAGNOSIS — F32A Depression, unspecified: Secondary | ICD-10-CM | POA: Diagnosis not present

## 2020-04-10 DIAGNOSIS — G629 Polyneuropathy, unspecified: Secondary | ICD-10-CM | POA: Diagnosis not present

## 2020-04-10 DIAGNOSIS — L89322 Pressure ulcer of left buttock, stage 2: Secondary | ICD-10-CM | POA: Diagnosis not present

## 2020-04-10 DIAGNOSIS — Z952 Presence of prosthetic heart valve: Secondary | ICD-10-CM | POA: Diagnosis not present

## 2020-04-10 DIAGNOSIS — Z7901 Long term (current) use of anticoagulants: Secondary | ICD-10-CM | POA: Diagnosis not present

## 2020-04-10 DIAGNOSIS — E785 Hyperlipidemia, unspecified: Secondary | ICD-10-CM | POA: Diagnosis not present

## 2020-04-10 DIAGNOSIS — D62 Acute posthemorrhagic anemia: Secondary | ICD-10-CM | POA: Diagnosis not present

## 2020-04-10 DIAGNOSIS — Z79899 Other long term (current) drug therapy: Secondary | ICD-10-CM | POA: Diagnosis not present

## 2020-04-10 DIAGNOSIS — Z79891 Long term (current) use of opiate analgesic: Secondary | ICD-10-CM | POA: Diagnosis not present

## 2020-04-10 DIAGNOSIS — M1990 Unspecified osteoarthritis, unspecified site: Secondary | ICD-10-CM | POA: Diagnosis not present

## 2020-04-10 DIAGNOSIS — Z7951 Long term (current) use of inhaled steroids: Secondary | ICD-10-CM | POA: Diagnosis not present

## 2020-04-10 DIAGNOSIS — M4125 Other idiopathic scoliosis, thoracolumbar region: Secondary | ICD-10-CM | POA: Diagnosis not present

## 2020-04-10 DIAGNOSIS — E538 Deficiency of other specified B group vitamins: Secondary | ICD-10-CM | POA: Diagnosis not present

## 2020-04-10 DIAGNOSIS — K219 Gastro-esophageal reflux disease without esophagitis: Secondary | ICD-10-CM | POA: Diagnosis not present

## 2020-04-10 DIAGNOSIS — R1310 Dysphagia, unspecified: Secondary | ICD-10-CM | POA: Diagnosis not present

## 2020-04-10 DIAGNOSIS — F419 Anxiety disorder, unspecified: Secondary | ICD-10-CM | POA: Diagnosis not present

## 2020-04-10 DIAGNOSIS — J309 Allergic rhinitis, unspecified: Secondary | ICD-10-CM | POA: Diagnosis not present

## 2020-04-10 DIAGNOSIS — Z9181 History of falling: Secondary | ICD-10-CM | POA: Diagnosis not present

## 2020-04-10 DIAGNOSIS — Z7982 Long term (current) use of aspirin: Secondary | ICD-10-CM | POA: Diagnosis not present

## 2020-04-10 DIAGNOSIS — I480 Paroxysmal atrial fibrillation: Secondary | ICD-10-CM | POA: Diagnosis not present

## 2020-04-10 NOTE — Telephone Encounter (Signed)
Ok for verbal ok

## 2020-04-10 NOTE — Telephone Encounter (Signed)
Verbal ok given.

## 2020-04-10 NOTE — Telephone Encounter (Signed)
Ok for verbal order  °

## 2020-04-10 NOTE — Telephone Encounter (Signed)
Informed pt ok to stop Amiodarone, per [name]. Pt is very nervous about this.  Explained that this can always be restarted if she has issues w/ irregularity.  Advised to contact AFib clinic if she is having Afib and/or HR issues prior to follow up in December. Patient verbalized understanding and agreeable to plan.

## 2020-04-10 NOTE — Telephone Encounter (Signed)
Home Health Verbal Orders  Agency  [facility] Caller:  Levada Dy -  Contact and title  Requesting OT/ PT/ Skilled nursing/ Social Work/ Speech:  Verbal order for nurse visit   Reason for Request:  for pressure ulcers and med management Frequency:  Once a week every week for 4 week , then every other week for a month    HH needs F2F w/in last 30 days

## 2020-04-16 ENCOUNTER — Ambulatory Visit (INDEPENDENT_AMBULATORY_CARE_PROVIDER_SITE_OTHER): Payer: Medicare Other | Admitting: Neurology

## 2020-04-16 ENCOUNTER — Encounter: Payer: Self-pay | Admitting: Neurology

## 2020-04-16 ENCOUNTER — Other Ambulatory Visit: Payer: Self-pay

## 2020-04-16 ENCOUNTER — Telehealth: Payer: Self-pay | Admitting: Family Medicine

## 2020-04-16 VITALS — BP 135/69 | HR 62 | Ht 66.0 in | Wt 121.0 lb

## 2020-04-16 DIAGNOSIS — Z131 Encounter for screening for diabetes mellitus: Secondary | ICD-10-CM

## 2020-04-16 DIAGNOSIS — E531 Pyridoxine deficiency: Secondary | ICD-10-CM

## 2020-04-16 DIAGNOSIS — G629 Polyneuropathy, unspecified: Secondary | ICD-10-CM | POA: Diagnosis not present

## 2020-04-16 DIAGNOSIS — E538 Deficiency of other specified B group vitamins: Secondary | ICD-10-CM | POA: Diagnosis not present

## 2020-04-16 DIAGNOSIS — R7309 Other abnormal glucose: Secondary | ICD-10-CM | POA: Diagnosis not present

## 2020-04-16 NOTE — Progress Notes (Signed)
[facility]    Provider:  Dr Jaynee Eagles Requesting Provider: [name], MD Primary Care Provider:  [name], MD  CC:  Leg pain  HPI:  [name] is a 74 y.o. female here as requested by [name], MD for neuropathy. PMHx status post mitral valve repair, atrial fibrillation status post maze operation, multiple spinal fusions and decompressions. , Peripheral neuropathy, paroxysmal A. fib, osteoporosis, osteopenia, kidney stones, herpes, hair loss, fracture of multiple ribs, benign positional vertigo, arthritis, anxiety, radiculitis, osteoporosis, scoliosis, chronic pain on oxycodone for years and is seen by pain management.  She had open-heart surgery in July. She had an ablation and a valve repair. She had scoliosis and she had the first surgery in 2015 and it di dnot go well. Many things went wrong, the rods had to come out and she had several surgeries afterwards, she had rods that broke, she had plastic surgery to cover the skin in her neck, she has been sick and in pain since 2015. She has been dealing with afib for many years as well. Her legs hurt, she has pain all over, she feels her legs are getting worse, she feels since her surgery she has more pain. She takes oxycodone for the neck and back pain. The pain in her legs is aching, hard for her to walk, her legs feel heavy, usually starts above the knees and aches. She will put heating pads on her legs. She has tried gabapentin and it doesn't for her any good. She used to have shots in her back would help. She feels heavy, aching so badly. She can't flex her feet or wiggle her toes and in her feet and ankles and she has swelling. She is taking a fluid pill. Upper extremities not affected. No other focal neurologic deficits, associated symptoms, inciting events or modifiable factors.  Reviewed notes, labs and imaging from outside physicians, which showed:  CT head 2014: normal for age, reviewed  images.   I reviewed notes from [facility] neurology, patient was seen there for chronic issues including facial pain, low back and neck pain, headaches, peripheral neuropathy.  They have tried Tegretol in the past and multiple other agents for her pain including gabapentin, she could not take Tegretol, she has many drug interactions, she has left facial pain that persists, she is try gabapentin, continued pain in her neck and back, long-term oxycodone, no history of alcohol use or drug use, examinations at neurology office included normal eye exam, lungs, cardiovascular, neurologic exam showed abnormal gait and stance ataxia, intact speech-language and memory, cranial nerves normal, no bulbar dysfunction or weakness, normal bulk and tone, mild to moderate distal more than proximal weakness in the upper and lower extremities, sensory exam with reduced sensation to light touch vibration joint position sense and pinprick in a stocking glove distribution, cerebellar exam was normal, deep tendon reflexes were hypoactive in the upper lower extremities with 1+ charger, Hoffmann's and palmomental responses absent, plantar responses flexor, gait is ataxic.  She was diagnosed with cervical radiculitis, cervicalgia, bilateral occipital neuralgia, paresthesias, polyneuropathy, sacroiliitis, trigeminal neuralgia, lumbar radiculopathy, and osteoarthritis.  She also has TMJ.  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  She had an EMG in 2013 which was stable as compared to 2012.  EMG showed acute on chronic changes of acute ongoing denervation and chronic neurogenic changes of arm and leg.  Patient had a repeat EMG/ncs  In January and August of 2014 with similar findings as 2012, 2013 and 2014.  Repeat EMG in 2015 again with similar findings.  EMG in 2017 again showed chronic multilevel lumbosacral radiculopathy, moderate sensorimotor polyneuropathy, bilateral median mononeuropathies, and again moderate C5-C6 greater than C7 greater than C8 radiculopathy.  EMGs in 2019 showed similar findings, as did EMG in 2020.  EMG in 2020 mostly stable. She has seen neurosurgery in the past as well, Dr. Annette Stable.   MRI of the brain I reviewed report March 12, 2006 showed no acute intracranial abnormality, it does not state that there is any white matter changes, thin cuts through the IACs.  MRI of the cervical spine 2013 reviewed report showed prior fusion at C5-C7 with anterior plate and screws and posterior facet screws connecting bar and cerclage wire.  Also showed prior fusion C5-C6 with broad-based spur greater to the left with mild left-sided spinal stenosis without cord compression.  C6-C7 prior fusion, broad-based spur greater to the right.  Very mild right-sided spinal stenosis without cord compression.  Most recent imaging at [facility]: (06/05/2020: CT cervical spine: Status post anterior fusion and instrumentation from C5  through C7. Posterior fusion and instrumentation from C2 down into the thoracic region. No evidence of pseudarthrosis, hardware failure, or loosening of hardware. 2. No destructive lesion. No spinal or foraminal stenosis.  03/25/2019: XR Thoracic and lumbar spine: 1. Surgical changes of posterior instrumented spinal fusion spanning C2 through the sacrum in addition to C5-C7 ACDF and ALIF at L4-L5 and L5-S1. No evidence for hardware complication. 2. Grossly unchanged height loss associated with remote L4 and L5 vertebral body fractures. No evidence of interval fracture. 3. Mild to moderate multilevel disc space narrowing.)   Review of Systems: Patient complains of symptoms per HPI as well as the following symptoms: chronic pain, neuropathy. Pertinent negatives and positives per HPI. All others negative.   Social History   Socioeconomic History  . Marital status: Divorced    Spouse name: Not on file  . Number of children: Not on file  . Years of education: Not on file  . Highest education level: 12th grade  Occupational History  . Not on file  Tobacco Use  . Smoking status: Former Smoker    Packs/day: 0.10    Years: 14.00    Pack years: 1.40    Types: Cigarettes  . Smokeless tobacco: Never Used  . Tobacco comment: 03/31/2018 "quit ~ 1980; someday smoker when I did smoke; never addicted"  Vaping Use  . Vaping Use: Never used  Substance and Sexual Activity  . Alcohol use: Never  . Drug use: Yes    Types: Oxycodone, Benzodiazepines    Comment: 03/31/2018 "for chronic neck and  back pain", takes Klonopin at times.   . Sexual activity: Not Currently  Other Topics Concern  . Not on file  Social History Narrative  . Not on file   Social Determinants of Health   Financial Resource Strain:   . Difficulty of Paying Living Expenses: Not on file  Food Insecurity: No Food Insecurity  . Worried About Charity fundraiser in the Last Year: Never true  . Ran Out of Food in the Last Year:  Never true  Transportation Needs: No Transportation Needs  . Lack of Transportation (Medical): No  . Lack of Transportation (Non-Medical): No  Physical Activity:   . Days of Exercise per Week: Not on file  . Minutes of Exercise per Session: Not on file  Stress:   . Feeling of Stress : Not on file  Social Connections:   . Frequency of Communication with Friends and Family: Not on file  . Frequency of Social Gatherings with Friends and Family: Not on file  . Attends Religious Services: Not on file  . Active Member of Clubs or Organizations: Not on file  . Attends Archivist Meetings: Not on file  . Marital Status: Not on file  Intimate Partner Violence:   . Fear of Current or Ex-Partner: Not on file  . Emotionally Abused: Not on file  . Physically Abused: Not on file  . Sexually Abused: Not on file    Family History  Problem Relation Age of Onset  . Diabetes Father   . Hyperlipidemia Sister   . Heart disease Sister   . Stroke Sister   . Diabetes Brother   . Hyperlipidemia Sister   . Heart disease Sister   . Arthritis Mother   . Heart disease Mother   . Uterine cancer Mother   . Diabetes Brother   . Heart Problems Brother     Past Medical History:  Diagnosis Date  . Anxiety   . Arthritis    "maybe in my back" (03/31/2018)  . Benign paroxysmal positional vertigo 06/08/2013  . Complication of anesthesia   . Fracture of multiple ribs 2015   "don't know from what; dx'd when I in hospital for 1st back OR" (03/31/2018)  . GERD (gastroesophageal reflux disease)   . Hair loss 04/12/2012  . Herpes   . History of blood transfusion    "twice; related to back OR" (03/31/2018)  . History of kidney stones   . Interstitial cystitis 11/06/2011  . 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 Mitral regurgitation   . Osteopenia 02/18/2012  . Osteoporosis   . PAF (paroxysmal atrial fibrillation) ([place]) 2012  . Peripheral neuropathy 11/06/2011  . PMDD (premenstrual dysphoric  disorder)   . PONV (postoperative nausea and vomiting)    nausea, vomiting, hives and dizziness   . 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/2020   Complete bilateral atrial lesion set using cryothermy and bipolar radiofrequency ablation with clipping of LA appendage via right mini-thoracotomy approach  . 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   Complex valvuloplasty including artificial Gore-tex neochord placement x12 with 66m Sorin Memo 4D ring annuloplasty  . Seasonal allergies   . Vaginal delivery    ONE NSVD  . 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 Atrial fibrillation ([place] 02/09/2020  . Long term (current) use of anticoagulants 02/09/2020  . Acute blood loss anemia 02/09/2020  . Debility 02/02/2020  . S/P mitral valve repair  01/17/2020  . 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/2020  . Severe mitral regurgitation   . 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 Ankle swelling, left 10/20/2019  . Ankle swelling, right 10/20/2019  . Secondary hypercoagulable state ([place]) 08/19/2019  . Left inguinal hernia 03/11/2019  . Visit for monitoring Tikosyn therapy 03/29/2018  . Osteoporosis 09/23/2017  . Fatigue 02/18/2017  . Physical exam 04/21/2016  . Vertigo 11/16/2015  . B12 deficiency 10/01/2015  . Hearing loss due to cerumen impaction 08/23/2015  . Foot pain, right 05/23/2015  . Hyperlipidemia 04/19/2015  . Protein-calorie malnutrition ([place]) 10/19/2014  . Fracture of multiple ribs 08/07/2014  . Tachycardia   . Constipation 07/14/2014  . Vitamin B12 deficiency anemia, unspecified 06/19/2014  . Hypokalemia 06/19/2014  . Unspecified atrial fibrillation ([place]) 06/19/2014  . Other chronic pain 06/19/2014  . Polyneuropathy, unspecified 06/19/2014  . Vitreous degeneration, bilateral 06/19/2014  . Nutritional deficiency, unspecified 06/19/2014  . Nocturia 06/19/2014  . Other symbolic dysfunctions 24/03/7352  . Unsteadiness  on feet 06/19/2014  . Unspecified osteoarthritis, unspecified site 06/19/2014  . Muscle weakness (generalized) 06/19/2014  . Major depressive disorder, single episode, unspecified 06/19/2014  . Frequency of micturition 06/19/2014  . Encounter for orthopedic aftercare following scoliosis surgery 06/19/2014  . Cardiac arrhythmia, unspecified 06/19/2014  . Age-related nuclear cataract, unspecified eye 06/19/2014  . Lumbar back pain 05/19/2012  . Anxiety 02/18/2012  . Ureteric stone 02/18/2012  . Hereditary and idiopathic peripheral neuropathy 11/06/2011  . S/P cervical spinal fusion 11/06/2011  . Gastro-esophageal reflux disease without esophagitis 11/06/2011  . 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 11/06/2011  . Melanoma of skin ([place]) 11/06/2011  . Personal history of other diseases of the circulatory system 11/06/2011  . Allergic rhinitis 04/25/2010  . 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 Generalized anxiety disorder 03/04/2010  . Other malaise and fatigue 02/05/2010  . Vitamin D deficiency 01/28/2010  . Neoplasm of uncertain behavior of skin 01/25/2010  . Genital herpes 11/01/2009  . Herpes simplex virus (HSV) infection 11/01/2009  . Herpetic vulvovaginitis 10/23/2009  . Kidney stone 08/09/2009  . Scoliosis (and kyphoscoliosis), idiopathic 07/23/2009  . Brachial neuritis or radiculitis 07/23/2009    Past Surgical History:  Procedure Laterality Date  . ANTERIOR CERVICAL DECOMP/DISCECTOMY FUSION  ~ 2003  . BACK SURGERY    . BREAST SURGERY     BREAST BIOPSY--RIGHT BENIGN  . BUNIONECTOMY Bilateral   . CARDIOVERSION N/A 01/26/2020   Procedure: CARDIOVERSION;  Surgeon: Geralynn Rile, MD;  Location: [facility];  Service: Cardiovascular;  Laterality: N/A;  . COSMETIC SURGERY  2016   "back of my neck; related to earlier fusion"  . CYSTOSCOPY W/ STONE MANIPULATION  "several times"  . DILATION AND CURETTAGE OF UTERUS    . FOREHEAD RECONSTRUCTION Right     "removed bone protruding out of my forehead"  . HARDWARE REMOVAL  2016   "related to neck OR"  . INCONTINENCE SURGERY    . IR THORACENTESIS ASP PLEURAL SPACE W/IMG GUIDE  02/16/2020  . MINIMALLY INVASIVE MAZE PROCEDURE N/A 01/17/2020   Procedure: MINIMALLY INVASIVE MAZE PROCEDURE;  Surgeon: Rexene Alberts, MD;  Location: [facility];  Service: Open Heart Surgery;  Laterality: N/A;  . MITRAL VALVE REPAIR Right 01/17/2020   Procedure: MINIMALLY INVASIVE MITRAL VALVE REPAIR (MVR) USING MEMO 4D 32MM;  Surgeon: Rexene Alberts, MD;  Location: [facility];  Service: Open Heart Surgery;  Laterality: Right;  . POSTERIOR CERVICAL FUSION/FORAMINOTOMY  ~ 2008; 2015  . RIGHT/LEFT HEART  CATH AND CORONARY ANGIOGRAPHY N/A 12/02/2019   Procedure: RIGHT/LEFT HEART CATH AND CORONARY ANGIOGRAPHY;  Surgeon: Burnell Blanks, MD;  Location: [facility];  Service: Cardiovascular;  Laterality: N/A;  . SHOULDER ARTHROSCOPY W/ ROTATOR CUFF REPAIR Right 2012  . SPINAL FUSION  06/2014 - 2018 X ?7   "scoliosis; my entire back"  . TEE WITHOUT CARDIOVERSION N/A 11/21/2019   Procedure: TRANSESOPHAGEAL ECHOCARDIOGRAM (TEE);  Surgeon: Josue Hector, MD;  Location: [facility];  Service: Cardiovascular;  Laterality: N/A;  . TEE WITHOUT CARDIOVERSION N/A 01/17/2020   Procedure: TRANSESOPHAGEAL ECHOCARDIOGRAM (TEE);  Surgeon: Rexene Alberts, MD;  Location: [facility];  Service: Open Heart Surgery;  Laterality: N/A;  . TEE WITHOUT CARDIOVERSION N/A 01/26/2020   Procedure: TRANSESOPHAGEAL ECHOCARDIOGRAM (TEE);  Surgeon: Geralynn Rile, MD;  Location: [facility];  Service: Cardiovascular;  Laterality: N/A;  . TUBAL LIGATION    . VAGINAL HYSTERECTOMY     TV[facility]    Current Outpatient Medications  Medication Sig Dispense Refill  . acetaminophen (TYLENOL) 500 MG tablet Take 500 mg by mouth every 6 (six) hours as needed for moderate pain.     [name] b complex vitamins tablet Take 1 tablet by mouth daily.    . Calcium Carbonate  Antacid (TUMS CHEWY BITES PO) Take 1 tablet by mouth daily as needed (reflux).     . Calcium Citrate-Vitamin D (CALCIUM + D PO) Take 1 tablet by mouth daily.    . Carboxymethylcellul-Glycerin (LUBRICATING EYE DROPS OP) Place 1 drop into both eyes daily as needed (dry eyes).    . clonazePAM (KLONOPIN) 0.5 MG tablet Take 1 tablet (0.5 mg total) by mouth 2 (two) times daily as needed for anxiety. 30 tablet 3  . cyclobenzaprine (FLEXERIL) 10 MG tablet Take 10 mg by mouth at bedtime as needed for muscle spasms.   5  . denosumab (PROLIA) 60 MG/ML SOSY injection Inject 60 mg into the skin every 6 (six) months.    . fluticasone (FLONASE) 50 MCG/ACT nasal spray Place 1 spray into both nostrils daily as needed for allergies or rhinitis.    . furosemide (LASIX) 40 MG tablet Take 1 tablet (40 mg total) by mouth daily. 90 tablet 2  . lactulose (CONSTULOSE) 10 GM/15ML solution Take 20 g by mouth daily as needed for moderate constipation (constipation.).     [name] melatonin 3 MG TABS tablet Take 1 tablet (3 mg total) by mouth at bedtime as needed (insomnia). 30 tablet 0  . metoprolol tartrate (LOPRESSOR) 50 MG tablet Take 1 tablet (50 mg total) by mouth 2 (two) times daily. 60 tablet 3  . Nutritional Supplements (,FEEDING SUPPLEMENT, PROSOURCE PLUS) liquid Take 30 mLs by mouth 2 (two) times daily between meals. 887 mL 0  . nystatin (MYCOSTATIN/NYSTOP) powder Apply topically 2 (two) times daily as needed (skin irritation (under breasts)). 15 g 0  . oxyCODONE (ROXICODONE) 5 MG/5ML solution Take 4 mLs (4 mg total) by mouth every 4 (four) hours as needed for moderate pain.  0  . polyethylene glycol (MIRALAX / GLYCOLAX) packet Take 17 g by mouth daily as needed for mild constipation.    . rivaroxaban (XARELTO) 20 MG TABS tablet Take 1 tablet (20 mg total) by mouth daily with supper. 30 tablet 6  . sodium chloride (OCEAN) 0.65 % SOLN nasal spray Place 1 spray into both nostrils as needed for congestion (nose bleeds).    .  valACYclovir (VALTREX) 500 MG tablet Take 500 mg by mouth daily as  needed (breakouts).      No current facility-administered medications for this visit.    Allergies as of 04/16/2020 - Review Complete 04/16/2020  Allergen Reaction Noted  . Buprenorphine Nausea Only and Other (See Comments) 10/01/2018  . Contrast media [iodinated diagnostic agents] Hives 10/29/2012  . Cymbalta [duloxetine hcl]  12/22/2017  . Erythromycin Nausea And Vomiting 01/09/2011  . Hydrocodone  10/01/2018  . Hydromorphone  10/01/2018  . Levofloxacin Nausea And Vomiting 06/07/2013  . Metrizamide Hives 06/19/2014  . Nsaids  09/12/2016  . Nucynta [tapentadol hcl]  10/01/2018  . Nucynta [tapentadol] Nausea Only 03/19/2020  . Other  01/09/2011  . Oxycodone  08/15/2015  . Pentazocine  10/01/2018  . Septra [bactrim]  01/09/2011  . Sulfa antibiotics Hives and Nausea Only 01/09/2011  . Sulfasalazine Hives 06/19/2014  . Adhesive [tape] Rash and Other (See Comments) 03/29/2018  . Latex Palpitations 01/09/2011  . Morphine Anxiety 08/15/2015    Vitals: BP 135/69   Pulse 62   Ht _0  (1.676 m)   Wt 121 lb (54.9 kg)   BMI 19.53 kg/m  Last Weight:  Wt Readings from Last 1 Encounters:  04/16/20 121 lb (54.9 kg)   Last Height:   Ht Readings from Last 1 Encounters:  04/16/20 _1  (1.676 m)     Physical exam: Exam: Gen: NAD, thin and frail appearing                     CV: RRR, no MRG. No Carotid Bruits. No peripheral edema, warm, nontender Eyes: Conjunctivae clear without exudates or hemorrhage  Neuro: Detailed Neurologic Exam  Speech:    Speech is normal; fluent and spontaneous with normal comprehension.  Cognition:    The patient is oriented to person, place, and time;     recent and remote memory intact;     language fluent;     normal attention, concentration,     fund of knowledge Cranial Nerves:    The pupils are equal, round, and reactive to light.  Attempted funduscopy could not visualize  due to small pupils. Visual fields are full to finger confrontation. Extraocular movements are intact. Trigeminal sensation is intact and the muscles of mastication are normal. The face is symmetric. The palate elevates in the midline. Hearing intact. Voice is normal. Shoulder shrug is normal. The tongue has normal motion without fasciculations.   Coordination:    No dysmetria or ataxia notes  Gait: Flexion at waist, using a walker, but good stride and stance, not ataxic  Motor Observation:    no involuntary movements noted. Tone:    No ogwheeling  Posture:    Flexed at waist    Strength:    Strength is V/V in the upper and lower limbs.      Sensation: prin prick less in the left foot then the right foot but can feel pin prick sharper in the feet than proximally. Vibration intact in the great toes 5 seconds. Can feel cold in the feet.      Reflex Exam:  DTR's: trace in the AJs, 1+ left ptaellar, trace right Patellar. 2+ biceps.     Toes:    The toes are equiv bilaterally.   Clonus:    Clonus is absent.    Assessment/Plan:  74 y.o. female here as requested by [name], MD for neuropathy. PMHx status post mitral valve repair, atrial fibrillation status post maze operation, peripheral neuropathy, paroxysmal A. fib, multiple spinal fusions and decompression, osteoporosis,  osteopenia, kidney stones, herpes, hair loss, fracture of multiple ribs, benign positional vertigo, arthritis, anxiety, radiculitis, osteoporosis, scoliosis, chronic pain on oxycodone for years and is seen by pain management.She has seen neurosurgery in the past as well as neurosurgery and orthopaedics.  She has diffuse degenerative disease in her neck and in her lumbar spine and had decompression surgery with hardware from C2 to the thoracic region per imaging.  She also has had thoracic compression fractures. She has had extensive spinal fusion surgeries from C2 to her sacrum per ortho notes 03/2020. She  states she is in constant pain and her activities are limited.  Patient is here primarily for chronic leg pain which appears to be more related to her radiculopathy and spinal issues as opposed to distal peripheral polyneuropathy.  She complains more about aching in the legs.  Her distal sensory exam is surprisingly good, she can feel pinprick and cold in the feet, she has reflexes distally, she also has intact vibration.  She has been seen by neurology in the past and had what appears to be at least 6 EMG nerve conductions since 2013 all with very similar findings including cervical and lumbar radiculopathy as well as distal polyneuropathy.  However examination and clinical symptoms today suggest less of a distal polyneuropathy and more related to spinal issues.  And as mentioned, she has had a series of EMG nerve conduction study reports from 2013 of 09/24/2018 with similar findings.    I had a long talk regarding the above with patient, unfortunately her symptoms are chronic, we will test her for several causes of distal peripheral polyneuropathy to complete evaluation.Nothing further from neurology, patient should continue with pain management, unfortunately all her conditions are chronic and have been thoroughly evaluated with neurology and neurosurgery and orthoapedics in the past including imaging, EMG nerve conduction studies.  Orders Placed This Encounter  Procedures  . B12 and Folate Panel  . Methylmalonic acid, serum  . Vitamin B1  . Sjogren's syndrome antibods(ssa + ssb)  . Rheumatoid factor  . Multiple Myeloma Panel (SPEP&IFE w/QIG)  . Vitamin B6  . Hemoglobin A1c  . ANA, IFA (with reflex)    Cc: [name], MD,  [name], MD  Sarina Ill, MD  [facility] [address] [place], [place] [address]  Phone [phone] Fax [phone]  I spent over 110 minutes of face-to-face and non-face-to-face time with patient on the    1. Polyneuropathy   2. B12 deficiency   3. Diabetes mellitus screening   4. Elevated glucose   5. Vitamin B6 deficiency    diagnosis.  This included previsit chart review, lab review, study review, order entry, electronic health record documentation, patient education on the different diagnostic and therapeutic options, counseling and coordination of care, risks and benefits of management, compliance, or risk factor reduction

## 2020-04-16 NOTE — Patient Instructions (Signed)
Blood work   Peripheral Neuropathy Peripheral neuropathy is a type of nerve damage. It affects nerves that carry signals between the spinal cord and the arms, legs, and the rest of the body (peripheral nerves). It does not affect nerves in the spinal cord or brain. In peripheral neuropathy, one nerve or a group of nerves may be damaged. Peripheral neuropathy is a broad category that includes many specific nerve disorders, like diabetic neuropathy, hereditary neuropathy, and carpal tunnel syndrome. What are the causes? This condition may be caused by:  Diabetes. This is the most common cause of peripheral neuropathy.  Nerve injury.  Pressure or stress on a nerve that lasts a long time.  Lack (deficiency) of B vitamins. This can result from alcoholism, poor diet, or a restricted diet.  Infections.  Autoimmune diseases, such as rheumatoid arthritis and systemic lupus erythematosus.  Nerve diseases that are passed from parent to child (inherited).  Some medicines, such as cancer medicines (chemotherapy).  Poisonous (toxic) substances, such as lead and mercury.  Too little blood flowing to the legs.  Kidney disease.  Thyroid disease. In some cases, the cause of this condition is not known. What are the signs or symptoms? Symptoms of this condition depend on which of your nerves is damaged. Common symptoms include:  Loss of feeling (numbness) in the feet, hands, or both.  Tingling in the feet, hands, or both.  Burning pain.  Very sensitive skin.  Weakness.  Not being able to move a part of the body (paralysis).  Muscle twitching.  Clumsiness or poor coordination.  Loss of balance.  Not being able to control your bladder.  Feeling dizzy.  Sexual problems. How is this diagnosed? Diagnosing and finding the cause of peripheral neuropathy can be difficult. Your health care provider will take your medical history and do a physical exam. A neurological exam will also be  done. This involves checking things that are affected by your brain, spinal cord, and nerves (nervous system). For example, your health care provider will check your reflexes, how you move, and what you can feel. You may have other tests, such as:  Blood tests.  Electromyogram (EMG) and nerve conduction tests. These tests check nerve function and how well the nerves are controlling the muscles.  Imaging tests, such as CT scans or MRI to rule out other causes of your symptoms.  Removing a small piece of nerve to be examined in a lab (nerve biopsy). This is rare.  Removing and examining a small amount of the fluid that surrounds the brain and spinal cord (lumbar puncture). This is rare. How is this treated? Treatment for this condition may involve:  Treating the underlying cause of the neuropathy, such as diabetes, kidney disease, or vitamin deficiencies.  Stopping medicines that can cause neuropathy, such as chemotherapy.  Medicine to relieve pain. Medicines may include: ? Prescription or over-the-counter pain medicine. ? Antiseizure medicine. ? Antidepressants. ? Pain-relieving patches that are applied to painful areas of skin.  Surgery to relieve pressure on a nerve or to destroy a nerve that is causing pain.  Physical therapy to help improve movement and balance.  Devices to help you move around (assistive devices). Follow these instructions at home: Medicines  Take over-the-counter and prescription medicines only as told by your health care provider. Do not take any other medicines without first asking your health care provider.  Do not drive or use heavy machinery while taking prescription pain medicine. Lifestyle   Do not use any  products that contain nicotine or tobacco, such as cigarettes and e-cigarettes. Smoking keeps blood from reaching damaged nerves. If you need help quitting, ask your health care provider.  Avoid or limit alcohol. Too much alcohol can cause a  vitamin B deficiency, and vitamin B is needed for healthy nerves.  Eat a healthy diet. This includes: ? Eating foods that are high in fiber, such as fresh fruits and vegetables, whole grains, and beans. ? Limiting foods that are high in fat and processed sugars, such as fried or sweet foods. General instructions   If you have diabetes, work closely with your health care provider to keep your blood sugar under control.  If you have numbness in your feet: ? Check every day for signs of injury or infection. Watch for redness, warmth, and swelling. ? Wear padded socks and comfortable shoes. These help protect your feet.  Develop a good support system. Living with peripheral neuropathy can be stressful. Consider talking with a mental health specialist or joining a support group.  Use assistive devices and attend physical therapy as told by your health care provider. This may include using a walker or a cane.  Keep all follow-up visits as told by your health care provider. This is important. Contact a health care provider if:  You have new signs or symptoms of peripheral neuropathy.  You are struggling emotionally from dealing with peripheral neuropathy.  Your pain is not well-controlled. Get help right away if:  You have an injury or infection that is not healing normally.  You develop new weakness in an arm or leg.  You fall frequently. Summary  Peripheral neuropathy is when the nerves in the arms, or legs are damaged, resulting in numbness, weakness, or pain.  There are many causes of peripheral neuropathy, including diabetes, pinched nerves, vitamin deficiencies, autoimmune disease, and hereditary conditions.  Diagnosing and finding the cause of peripheral neuropathy can be difficult. Your health care provider will take your medical history, do a physical exam, and do tests, including blood tests and nerve function tests.  Treatment involves treating the underlying cause of the  neuropathy and taking medicines to help control pain. Physical therapy and assistive devices may also help. This information is not intended to replace advice given to you by your health care provider. Make sure you discuss any questions you have with your health care provider. Document Revised: 06/05/2017 Document Reviewed: 09/01/2016 Elsevier Patient Education  2020 [employer].

## 2020-04-16 NOTE — Telephone Encounter (Signed)
Order# [id number]

## 2020-04-16 NOTE — Telephone Encounter (Signed)
I have placed a HH plan of care and cert. In the bin upfront.

## 2020-04-16 NOTE — Telephone Encounter (Signed)
Paperwork given to PCP for review.  

## 2020-04-18 DIAGNOSIS — I051 Rheumatic mitral insufficiency: Secondary | ICD-10-CM | POA: Diagnosis not present

## 2020-04-18 DIAGNOSIS — D62 Acute posthemorrhagic anemia: Secondary | ICD-10-CM | POA: Diagnosis not present

## 2020-04-18 DIAGNOSIS — I480 Paroxysmal atrial fibrillation: Secondary | ICD-10-CM | POA: Diagnosis not present

## 2020-04-18 DIAGNOSIS — F419 Anxiety disorder, unspecified: Secondary | ICD-10-CM | POA: Diagnosis not present

## 2020-04-18 DIAGNOSIS — L89322 Pressure ulcer of left buttock, stage 2: Secondary | ICD-10-CM | POA: Diagnosis not present

## 2020-04-18 DIAGNOSIS — F32A Depression, unspecified: Secondary | ICD-10-CM | POA: Diagnosis not present

## 2020-04-19 ENCOUNTER — Telehealth: Payer: Self-pay | Admitting: Cardiology

## 2020-04-19 DIAGNOSIS — K5900 Constipation, unspecified: Secondary | ICD-10-CM

## 2020-04-19 DIAGNOSIS — G8929 Other chronic pain: Secondary | ICD-10-CM | POA: Diagnosis not present

## 2020-04-19 DIAGNOSIS — M810 Age-related osteoporosis without current pathological fracture: Secondary | ICD-10-CM

## 2020-04-19 DIAGNOSIS — I480 Paroxysmal atrial fibrillation: Secondary | ICD-10-CM | POA: Diagnosis not present

## 2020-04-19 DIAGNOSIS — R1310 Dysphagia, unspecified: Secondary | ICD-10-CM

## 2020-04-19 DIAGNOSIS — H8110 Benign paroxysmal vertigo, unspecified ear: Secondary | ICD-10-CM

## 2020-04-19 DIAGNOSIS — Z7901 Long term (current) use of anticoagulants: Secondary | ICD-10-CM

## 2020-04-19 DIAGNOSIS — Z5181 Encounter for therapeutic drug level monitoring: Secondary | ICD-10-CM

## 2020-04-19 DIAGNOSIS — E538 Deficiency of other specified B group vitamins: Secondary | ICD-10-CM | POA: Diagnosis not present

## 2020-04-19 DIAGNOSIS — I051 Rheumatic mitral insufficiency: Secondary | ICD-10-CM | POA: Diagnosis not present

## 2020-04-19 DIAGNOSIS — K219 Gastro-esophageal reflux disease without esophagitis: Secondary | ICD-10-CM | POA: Diagnosis not present

## 2020-04-19 DIAGNOSIS — E785 Hyperlipidemia, unspecified: Secondary | ICD-10-CM

## 2020-04-19 DIAGNOSIS — J309 Allergic rhinitis, unspecified: Secondary | ICD-10-CM

## 2020-04-19 DIAGNOSIS — M1990 Unspecified osteoarthritis, unspecified site: Secondary | ICD-10-CM

## 2020-04-19 DIAGNOSIS — M4125 Other idiopathic scoliosis, thoracolumbar region: Secondary | ICD-10-CM

## 2020-04-19 DIAGNOSIS — Z79899 Other long term (current) drug therapy: Secondary | ICD-10-CM

## 2020-04-19 DIAGNOSIS — L89322 Pressure ulcer of left buttock, stage 2: Secondary | ICD-10-CM | POA: Diagnosis not present

## 2020-04-19 DIAGNOSIS — F419 Anxiety disorder, unspecified: Secondary | ICD-10-CM | POA: Diagnosis not present

## 2020-04-19 DIAGNOSIS — G629 Polyneuropathy, unspecified: Secondary | ICD-10-CM | POA: Diagnosis not present

## 2020-04-19 DIAGNOSIS — Z7982 Long term (current) use of aspirin: Secondary | ICD-10-CM

## 2020-04-19 DIAGNOSIS — Z952 Presence of prosthetic heart valve: Secondary | ICD-10-CM

## 2020-04-19 DIAGNOSIS — Z9181 History of falling: Secondary | ICD-10-CM

## 2020-04-19 DIAGNOSIS — F32A Depression, unspecified: Secondary | ICD-10-CM | POA: Diagnosis not present

## 2020-04-19 DIAGNOSIS — Z79891 Long term (current) use of opiate analgesic: Secondary | ICD-10-CM

## 2020-04-19 DIAGNOSIS — D62 Acute posthemorrhagic anemia: Secondary | ICD-10-CM | POA: Diagnosis not present

## 2020-04-19 NOTE — Telephone Encounter (Signed)
FYI

## 2020-04-19 NOTE — Telephone Encounter (Signed)
° ° °  Pt c/o medication issue:  1. Name of Medication: baby aspirin  2. How are you currently taking this medication (dosage and times per day)?   3. Are you having a reaction (difficulty breathing--STAT)?   4. What is your medication issue? Pt would like to know if she needs to continue taking baby aspirin

## 2020-04-19 NOTE — Telephone Encounter (Signed)
Form completed and placed in basket  

## 2020-04-19 NOTE — Telephone Encounter (Signed)
Plan of care faxed to [phone] - copy sent to scanning

## 2020-04-20 NOTE — Telephone Encounter (Signed)
Advised pt to continue ASA, per [name]. Patient verbalized understanding and agreeable to plan.

## 2020-04-20 NOTE — Telephone Encounter (Signed)
Routing to [name] for advisement.

## 2020-04-21 LAB — FANA STAINING PATTERNS: Homogeneous Pattern: 1:160 {titer} — ABNORMAL HIGH

## 2020-04-21 LAB — B12 AND FOLATE PANEL
Folate: >20 ng/mL (ref >3.0)
Vitamin B-12: 454 pg/mL (ref 232–1245)

## 2020-04-21 LAB — MULTIPLE MYELOMA PANEL, SERUM
Albumin SerPl Elph-Mcnc: 3.4 g/dL (ref 2.9–4.4)
Albumin/Glob SerPl: 1 (ref 0.7–1.7)
Alpha 1: 0.3 g/dL (ref 0.0–0.4)
Alpha2 Glob SerPl Elph-Mcnc: 1 g/dL (ref 0.4–1.0)
B-Globulin SerPl Elph-Mcnc: 0.9 g/dL (ref 0.7–1.3)
Gamma Glob SerPl Elph-Mcnc: 1.2 g/dL (ref 0.4–1.8)
Globulin, Total: 3.5 g/dL (ref 2.2–3.9)
IgA/Immunoglobulin A, Serum: 216 mg/dL (ref 64–422)
IgG (Immunoglobin G), Serum: 1235 mg/dL (ref 586–1602)
IgM (Immunoglobulin M), Srm: 162 mg/dL (ref 26–217)
Total Protein: 6.9 g/dL (ref 6.0–8.5)

## 2020-04-21 LAB — VITAMIN B1: Thiamine: 217.2 nmol/L — ABNORMAL HIGH (ref 66.5–200.0)

## 2020-04-21 LAB — RHEUMATOID FACTOR: Rheumatoid fact SerPl-aCnc: <10 IU/mL (ref 0.0–13.9)

## 2020-04-21 LAB — HEMOGLOBIN A1C
Est. average glucose Bld gHb Est-mCnc: 114 mg/dL
Hgb A1c MFr Bld: 5.6 % (ref 4.8–5.6)

## 2020-04-21 LAB — VITAMIN B6: Vitamin B6: 9.1 ug/L (ref 2.0–32.8)

## 2020-04-21 LAB — METHYLMALONIC ACID, SERUM: Methylmalonic Acid: 159 nmol/L (ref 0–378)

## 2020-04-21 LAB — SJOGREN'S SYNDROME ANTIBODS(SSA + SSB)
ENA SSA (RO) Ab: <0.2 AI (ref 0.0–0.9)
ENA SSB (LA) Ab: <0.2 AI (ref 0.0–0.9)

## 2020-04-21 LAB — ANTINUCLEAR ANTIBODIES, IFA: ANA Titer 1: POSITIVE — AB

## 2020-04-24 DIAGNOSIS — L89322 Pressure ulcer of left buttock, stage 2: Secondary | ICD-10-CM | POA: Diagnosis not present

## 2020-04-24 DIAGNOSIS — I051 Rheumatic mitral insufficiency: Secondary | ICD-10-CM | POA: Diagnosis not present

## 2020-04-24 DIAGNOSIS — F32A Depression, unspecified: Secondary | ICD-10-CM | POA: Diagnosis not present

## 2020-04-24 DIAGNOSIS — F419 Anxiety disorder, unspecified: Secondary | ICD-10-CM | POA: Diagnosis not present

## 2020-04-24 DIAGNOSIS — D62 Acute posthemorrhagic anemia: Secondary | ICD-10-CM | POA: Diagnosis not present

## 2020-04-24 DIAGNOSIS — I480 Paroxysmal atrial fibrillation: Secondary | ICD-10-CM | POA: Diagnosis not present

## 2020-04-30 DIAGNOSIS — F32A Depression, unspecified: Secondary | ICD-10-CM | POA: Diagnosis not present

## 2020-04-30 DIAGNOSIS — D62 Acute posthemorrhagic anemia: Secondary | ICD-10-CM | POA: Diagnosis not present

## 2020-04-30 DIAGNOSIS — L89322 Pressure ulcer of left buttock, stage 2: Secondary | ICD-10-CM | POA: Diagnosis not present

## 2020-04-30 DIAGNOSIS — F419 Anxiety disorder, unspecified: Secondary | ICD-10-CM | POA: Diagnosis not present

## 2020-04-30 DIAGNOSIS — I051 Rheumatic mitral insufficiency: Secondary | ICD-10-CM | POA: Diagnosis not present

## 2020-04-30 DIAGNOSIS — I480 Paroxysmal atrial fibrillation: Secondary | ICD-10-CM | POA: Diagnosis not present

## 2020-05-01 ENCOUNTER — Telehealth: Payer: Self-pay | Admitting: Cardiology

## 2020-05-01 ENCOUNTER — Telehealth: Payer: Self-pay | Admitting: Neurology

## 2020-05-01 DIAGNOSIS — Z9889 Other specified postprocedural states: Secondary | ICD-10-CM

## 2020-05-01 NOTE — Telephone Encounter (Signed)
Pt will have to have a tooth filled at the end of the week. Her Oral Surgeon (Dr. Roxy Manns) said she needs to have an antibiotic before any dental procedure. Her Oral Surgeon  will not call in an antibiotic so she needs Sherri to send an rx to the pharmacy for her. Pt still uses [facility], [place]   Patient also wanted to know if it was safe to have a filling done at this time. She had surgery 3.5 months ago and is not sure if it will be safe to have the procedure done  Her Dentist was Dr. Freda Munro but he has retired. The office will know who treats the patient [phone].

## 2020-05-01 NOTE — Telephone Encounter (Signed)
I called the pt and discussed that her labs looked fine. There were no concerns. The ANA was slightly elevated, however per Dr Jaynee Eagles, a lot of people normally that in their blood and it would have to be a lot higher to worry about. The pt's questions were answered. She is aware nothing further with neurology except for pt to return if symptoms worsen or fail to improve. Pt advised to continue with pain management and primary care. She was made aware that lab results and office note are on mychart if she would like to see them. She verbalized appreciation for the call.    All labs look fine. I am not concerned about the slight increase of ANA, a lot of people normally have that in their blood and it would have to be a lot higher to worry about it. Thanks.  Written by Melvenia Beam, MD on 04/22/2020  9:22 PM EDT

## 2020-05-01 NOTE — Telephone Encounter (Signed)
Pt is asking for a call with the results to her lab work

## 2020-05-02 MED ORDER — AMOXICILLIN 500 MG PO TABS: ORAL_TABLET | ORAL | 6 refills | Status: DC

## 2020-05-02 NOTE — Telephone Encounter (Signed)
Patient states she is returning a call. 

## 2020-05-02 NOTE — Telephone Encounter (Signed)
Spoke to Dr. Angelena Form, pt and dentist office. Pt aware ok to proceed w/ tooth filling on Monday.  Advised pt that she will need to take abx prophylaxis prior to all dental visits.  Aware Rx sent in.  Dental office made aware as well. Aware office will call to arrange establishment w/ general cardiology to follow her valve going forward (aware [name] will not follow her valve).  Pt is agreeable to plan and requests Dr. Johnsie Cancel.

## 2020-05-03 MED ORDER — CEPHALEXIN 500 MG PO CAPS: ORAL_CAPSULE | ORAL | 0 refills | Status: DC

## 2020-05-03 NOTE — Telephone Encounter (Signed)
   Pt c/o medication issue:  1. Name of Medication:   amoxicillin (AMOXIL) 500 MG tablet     2. How are you currently taking this medication (dosage and times per day)?   3. Are you having a reaction (difficulty breathing--STAT)?   4. What is your medication issue? Pt is calling she said the amoxicillin is in the penicillin family and she had problem taking penicillin in the past she wanted to check in with [name] if there's a alternative antibiotics she can get to take before her dentist appt.

## 2020-05-03 NOTE — Telephone Encounter (Signed)
Discussed with PharmD.  Advised to write for Keflex 2 grams PO 1 hour prior to dental procedure.  Prescription sent to pharmacy   Pt notified.

## 2020-05-03 NOTE — Addendum Note (Signed)
Addended by: Willeen Cass A on: 05/03/2020 02:53 PM   Modules accepted: Orders

## 2020-05-04 ENCOUNTER — Telehealth: Payer: Self-pay

## 2020-05-04 NOTE — Progress Notes (Signed)
Subjective:   [name] is a 74 y.o. female who presents for Medicare Annual (Subsequent) preventive examination.  I connected with [place] today by telephone and verified that I am speaking with the correct person using two identifiers. Location patient: home Location provider: work Persons participating in the virtual visit: patient, Marine scientist.    I discussed the limitations, risks, security and privacy concerns of performing an evaluation and management service by telephone and the availability of in person appointments. I also discussed with the patient that there may be a patient responsible charge related to this service. The patient expressed understanding and verbally consented to this telephonic visit.    Interactive audio and video telecommunications were attempted between this provider and patient, however failed, due to patient having technical difficulties OR patient did not have access to video capability.  We continued and completed visit with audio only.  Some vital signs may be absent or patient reported.   Time Spent with patient on telephone encounter: 25 minutes  Review of Systems     Cardiac Risk Factors include: advanced age (>42men, >73 women);dyslipidemia     Objective:    Today's Vitals   05/07/20 1545 05/07/20 1546  Weight: 121 lb (54.9 kg)   Height: 5\' 6"  (1.676 m)   PainSc:  6    Body mass index is 19.53 kg/m.  Advanced Directives 05/07/2020 02/20/2020 02/03/2020 02/02/2020 01/21/2020 01/13/2020 12/12/2019  Does Patient Have a Medical Advance Directive? No No No - No No No  Would patient like information on creating a medical advance directive? No - Patient declined No - Patient declined - No - Patient declined No - Patient declined Yes (MAU/Ambulatory/Procedural Areas - Information given) No - Patient declined    Current Medications (verified) Outpatient Encounter Medications as of 05/07/2020  Medication Sig  . acetaminophen (TYLENOL) 500 MG tablet Take 500  mg by mouth every 6 (six) hours as needed for moderate pain.   [name] b complex vitamins tablet Take 1 tablet by mouth daily.  . Calcium Carbonate Antacid (TUMS CHEWY BITES PO) Take 1 tablet by mouth daily as needed (reflux).   . Calcium Citrate-Vitamin D (CALCIUM + D PO) Take 1 tablet by mouth daily.  . Carboxymethylcellul-Glycerin (LUBRICATING EYE DROPS OP) Place 1 drop into both eyes daily as needed (dry eyes).  . cephALEXin (KEFLEX) 500 MG capsule Take 2 grams (4 tablets) by mouth 1 hour prior to dental procedure  . clonazePAM (KLONOPIN) 0.5 MG tablet Take 1 tablet (0.5 mg total) by mouth 2 (two) times daily as needed for anxiety.  . cyclobenzaprine (FLEXERIL) 10 MG tablet Take 10 mg by mouth at bedtime as needed for muscle spasms.   [name] denosumab (PROLIA) 60 MG/ML SOSY injection Inject 60 mg into the skin every 6 (six) months.  . fluticasone (FLONASE) 50 MCG/ACT nasal spray Place 1 spray into both nostrils daily as needed for allergies or rhinitis.  . furosemide (LASIX) 40 MG tablet Take 1 tablet (40 mg total) by mouth daily.  [name] lactulose (CONSTULOSE) 10 GM/15ML solution Take 20 g by mouth daily as needed for moderate constipation (constipation.).   [name] melatonin 3 MG TABS tablet Take 1 tablet (3 mg total) by mouth at bedtime as needed (insomnia).  . metoprolol tartrate (LOPRESSOR) 50 MG tablet Take 1 tablet (50 mg total) by mouth 2 (two) times daily.  . Nutritional Supplements (,FEEDING SUPPLEMENT, PROSOURCE PLUS) liquid Take 30 mLs by mouth 2 (two) times daily between meals.  . nystatin (  MYCOSTATIN/NYSTOP) powder Apply topically 2 (two) times daily as needed (skin irritation (under breasts)).  [name] oxyCODONE (ROXICODONE) 5 MG/5ML solution Take 4 mLs (4 mg total) by mouth every 4 (four) hours as needed for moderate pain.  . polyethylene glycol (MIRALAX / GLYCOLAX) packet Take 17 g by mouth daily as needed for mild constipation.  . rivaroxaban (XARELTO) 20 MG TABS tablet Take 1 tablet (20 mg total) by  mouth daily with supper.  . sodium chloride (OCEAN) 0.65 % SOLN nasal spray Place 1 spray into both nostrils as needed for congestion (nose bleeds).  . valACYclovir (VALTREX) 500 MG tablet Take 500 mg by mouth daily as needed (breakouts).    No facility-administered encounter medications on file as of 05/07/2020.    Allergies (verified) Buprenorphine, Contrast media [iodinated diagnostic agents], Cymbalta [duloxetine hcl], Erythromycin, Hydrocodone, Hydromorphone, Levofloxacin, Metrizamide, Nsaids, Nucynta [tapentadol hcl], Nucynta [tapentadol], Other, Oxycodone, Pentazocine, Septra [bactrim], Sulfa antibiotics, Sulfasalazine, Adhesive [tape], Latex, and Morphine   History: Past Medical History:  Diagnosis Date  . Anxiety   . Arthritis    "maybe in my back" (03/31/2018)  . Benign paroxysmal positional vertigo 06/08/2013  . Complication of anesthesia   . Fracture of multiple ribs 2015   "don't know from what; dx'd when I in hospital for 1st back OR" (03/31/2018)  . GERD (gastroesophageal reflux disease)   . Hair loss 04/12/2012  . Herpes   . History of blood transfusion    "twice; related to back OR" (03/31/2018)  . History of kidney stones   . Interstitial cystitis 11/06/2011  . 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 Mitral regurgitation   . Osteopenia 02/18/2012  . Osteoporosis   . PAF (paroxysmal atrial fibrillation) ([place]) 2012  . Peripheral neuropathy 11/06/2011  . PMDD (premenstrual dysphoric disorder)   . PONV (postoperative nausea and vomiting)    nausea, vomiting, hives and dizziness   . 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/2020   Complete bilateral atrial lesion set using cryothermy and bipolar radiofrequency ablation with clipping of LA appendage via right mini-thoracotomy approach  . 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 Seasonal allergies   .  Vaginal delivery    ONE NSVD  . 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 ANTERIOR CERVICAL DECOMP/DISCECTOMY FUSION  ~ 2003  . BACK SURGERY    . BREAST SURGERY     BREAST BIOPSY--RIGHT BENIGN  . BUNIONECTOMY Bilateral   . CARDIOVERSION N/A 01/26/2020   Procedure: CARDIOVERSION;  Surgeon: Geralynn Rile, MD;  Location: [facility];  Service: Cardiovascular;  Laterality: N/A;  . COSMETIC SURGERY  2016   "back of my neck; related to earlier fusion"  . CYSTOSCOPY W/ STONE MANIPULATION  "several times"  . DILATION AND CURETTAGE OF UTERUS    . FOREHEAD RECONSTRUCTION Right    "removed bone protruding out of my forehead"  . HARDWARE REMOVAL  2016   "related to neck OR"  . INCONTINENCE SURGERY    . IR THORACENTESIS ASP PLEURAL SPACE W/IMG GUIDE  02/16/2020  . MINIMALLY INVASIVE MAZE PROCEDURE N/A 01/17/2020   Procedure: MINIMALLY INVASIVE MAZE PROCEDURE;  Surgeon: Rexene Alberts, MD;  Location: [facility];  Service: Open Heart Surgery;  Laterality: N/A;  . MITRAL VALVE REPAIR Right 01/17/2020   Procedure: MINIMALLY INVASIVE MITRAL VALVE REPAIR (MVR) USING MEMO 4D 32MM;  Surgeon:  Rexene Alberts, MD;  Location: [place];  Service: Open Heart Surgery;  Laterality: Right;  . POSTERIOR CERVICAL FUSION/FORAMINOTOMY  ~ 2008; 2015  . RIGHT/LEFT HEART CATH AND CORONARY ANGIOGRAPHY N/A 12/02/2019   Procedure: RIGHT/LEFT HEART CATH AND CORONARY ANGIOGRAPHY;  Surgeon: Burnell Blanks, MD;  Location: [facility];  Service: Cardiovascular;  Laterality: N/A;  . SHOULDER ARTHROSCOPY W/ ROTATOR CUFF REPAIR Right 2012  . SPINAL FUSION  06/2014 - 2018 X ?7   "scoliosis; my entire back"  . TEE WITHOUT CARDIOVERSION N/A 11/21/2019   Procedure: TRANSESOPHAGEAL ECHOCARDIOGRAM (TEE);  Surgeon: Josue Hector, MD;  Location: [facility];  Service: Cardiovascular;  Laterality: N/A;  . TEE WITHOUT CARDIOVERSION N/A 01/17/2020   Procedure: TRANSESOPHAGEAL  ECHOCARDIOGRAM (TEE);  Surgeon: Rexene Alberts, MD;  Location: [place];  Service: Open Heart Surgery;  Laterality: N/A;  . TEE WITHOUT CARDIOVERSION N/A 01/26/2020   Procedure: TRANSESOPHAGEAL ECHOCARDIOGRAM (TEE);  Surgeon: Geralynn Rile, MD;  Location: [facility];  Service: Cardiovascular;  Laterality: N/A;  . TUBAL LIGATION    . VAGINAL HYSTERECTOMY     TVH   Family History  Problem Relation Age of Onset  . Diabetes Father   . Hyperlipidemia Sister   . Heart disease Sister   . Stroke Sister   . Diabetes Brother   . Hyperlipidemia Sister   . Heart disease Sister   . Arthritis Mother   . Heart disease Mother   . Uterine cancer Mother   . Diabetes Brother   . Heart Problems Brother    Social History   Socioeconomic History  . Marital status: Divorced    Spouse name: Not on file  . Number of children: Not on file  . Years of education: Not on file  . Highest education level: 12th grade  Occupational History  . Not on file  Tobacco Use  . Smoking status: Former Smoker    Packs/day: 0.10    Years: 14.00    Pack years: 1.40    Types: Cigarettes  . Smokeless tobacco: Never Used  . Tobacco comment: 03/31/2018 "quit ~ 1980; someday smoker when I did smoke; never addicted"  Vaping Use  . Vaping Use: Never used  Substance and Sexual Activity  . Alcohol use: Never  . Drug use: Yes    Types: Oxycodone, Benzodiazepines    Comment: 03/31/2018 "for chronic neck and back pain", takes Klonopin at times.   . Sexual activity: Not Currently  Other Topics Concern  . Not on file  Social History Narrative  . Not on file   Social Determinants of Health   Financial Resource Strain: Low Risk   . Difficulty of Paying Living Expenses: Not hard at all  Food Insecurity: No Food Insecurity  . Worried About Charity fundraiser in the Last Year: Never true  . Ran Out of Food in the Last Year: Never true  Transportation Needs: No Transportation Needs  . Lack of Transportation  (Medical): No  . Lack of Transportation (Non-Medical): No  Physical Activity: Insufficiently Active  . Days of Exercise per Week: 3 days  . Minutes of Exercise per Session: 20 min  Stress: No Stress Concern Present  . Feeling of Stress : Not at all  Social Connections: Moderately Isolated  . Frequency of Communication with Friends and Family: More than three times a week  . Frequency of Social Gatherings with Friends and Family: Once a week  . Attends Religious Services: 1 to 4 times  per year  . Active Member of Clubs or Organizations: No  . Attends Archivist Meetings: Never  . Marital Status: Divorced    Tobacco Counseling Counseling given: Not Answered Comment: 03/31/2018 "quit ~ 1980; someday smoker when I did smoke; never addicted"   Clinical Intake:  Pre-visit preparation completed: Yes  Pain : 0-10 Pain Score: 6  Pain Type: Chronic pain Pain Location: Back Pain Onset: More than a month ago Pain Frequency: Constant     Nutritional Risks: None Diabetes: No  How often do you need to have someone help you when you read instructions, pamphlets, or other written materials from your doctor or pharmacy?: 1 - Never What is the last grade level you completed in school?: 12th grade  Diabetic?No  Interpreter Needed?: No  Information entered by :: Caroleen Hamman LPN   Activities of Daily Living In your present state of health, do you have any difficulty performing the following activities: 05/07/2020 03/16/2020  Hearing? N N  Vision? N N  Difficulty concentrating or making decisions? N N  Walking or climbing stairs? N N  Dressing or bathing? N N  Doing errands, shopping? N N  Preparing Food and eating ? N -  Using the Toilet? N -  In the past six months, have you accidently leaked urine? N -  Do you have problems with loss of bowel control? N -  Managing your Medications? N -  Managing your Finances? N -  Housekeeping or managing your Housekeeping? N -    Some recent data might be hidden    Patient Care Team: [name], MD as PCP - General (Family Medicine) Constance Haw, MD as PCP - Electrophysiology (Cardiology) Domingo Pulse, MD as Consulting Physician (Urology) Richmond Campbell, MD as Consulting Physician (Gastroenterology) Atilano Ina, MD as Referring Physician (Neurosurgery) Terrance Mass, MD (Inactive) as Consulting Physician (Gynecology) Jacolyn Reedy, MD as Consulting Physician (Cardiology) Kriste Basque, MD as Referring Physician (Pain Medicine) [facility] Rolm Bookbinder, MD as Consulting Physician (Dermatology) Manton Kerbs, MD as Consulting Physician (Specialist) Barbaraann Faster, RN as [place] Management Rexene Alberts, MD as Consulting Physician (Cardiothoracic Surgery) Melvenia Beam, MD as Consulting Physician (Neurology) [name], DDS as Consulting Physician (Dentistry)  Indicate any recent Medical Services you may have received from other than [facility] providers in the past year (date may be approximate).     Assessment:   This is a routine wellness examination for [place].  Hearing/Vision screen  Hearing Screening   125Hz  250Hz  500Hz  1000Hz  2000Hz  3000Hz  4000Hz  6000Hz  8000Hz   Right ear:           Left ear:           Vision Screening Comments: Last eye exam-2020 Dr. Jabier Mutton  Dietary issues and exercise activities discussed: Current Exercise Habits: Home exercise routine, Type of exercise: strength training/weights, Time (Minutes): 20, Frequency (Times/Week): 3, Weekly Exercise (Minutes/Week): 60, Intensity: Mild, Exercise limited by: None identified  Goals      Patient Stated   .  [facility]) Patient will be able to manage her atrial fibrillation at home (pt-stated)      [place] (see longitudinal plan of care for additional care plan information)   Current Barriers:  [name] Knowledge deficit related to basic atrial  fibrillation pathophysiology and self care management . Cognitive Deficits  Case Manager Clinical Goal(s):  [name] Over the next 90 days, patient will verbalize understanding of atrial  fibrillation Action Plan and when to call doctor 04/02/20 progressing . Over the next 45 days, patient will take all atrial fibrillation mediations as prescribed- 04/02/20 progressing . Patient will verbalize two symptoms of atrial fibrillation exacerbation (increase chest pain, irregular heart rate, dizziness, weakness, fainting, struggle to breathe, confusion, symptoms of stroke) within the next 30 days. 04/02/20 progressing  Interventions:  . Basic overview and discussion of pathophysiology of atrial fibrillation reviewed  . Provided written education on heart healthy diet . Reviewed atrial fibrillation Action Plan in depth and provided written copy . Review use of anticoagulant . Discussed importance of pcp follow up . Discussed [facility] services and progression . Discussed preventative care  . Provided encouragement  . Afib follow up and preventative assessment  Patient Self Care Activities:  . Takes atrial fibrillation  Medications as prescribed . Verbalizes understanding of and follows atrial fibrillation  Action Plan . Adheres to low sodium diet . Continue to work with [facility]  . Visit pcp on 03/13/20  Please see past updates related to this goal by clicking on the "Past Updates" button in the selected goal        Other   .  Patient Stated      Maintain or improve current helath      Depression Screen PHQ 2/9 Scores 05/07/2020 04/02/2020 03/23/2020 03/16/2020 10/18/2019 08/02/2019 03/11/2019  PHQ - 2 Score 0 2 0 4 0 0 6  PHQ- 9 Score - 5 1 6  - 0 11  Exception Documentation - - - - - - -  Some encounter information is confidential and restricted. Go to Review Flowsheets activity to see all data.    Fall Risk Fall Risk  05/07/2020 03/23/2020 03/16/2020 10/18/2019 08/02/2019  Falls in the past year? 0 0 0 0 0    Number falls in past yr: 0 0 0 0 0  Injury with Fall? 0 0 0 0 0  Risk for fall due to : - - Impaired balance/gait;Impaired mobility - -  Follow up Falls prevention discussed - Falls evaluation completed Falls evaluation completed Falls evaluation completed    Any stairs in or around the home? Yes  If so, are there any without handrails? No  Home free of loose throw rugs in walkways, pet beds, electrical cords, etc? Yes  Adequate lighting in your home to reduce risk of falls? Yes   ASSISTIVE DEVICES UTILIZED TO PREVENT FALLS:  Life alert? No  Use of a cane, walker or w/c? Yes  Grab bars in the bathroom? Yes  Shower chair or bench in shower? No  Elevated toilet seat or a handicapped toilet? No   TIMED UP AND GO:  Was the test performed? No . Phone visit   Cognitive Function:No cognitive impairment noted.        Immunizations Immunization History  Administered Date(s) Administered  . DTaP 07/07/2009  . Influenza Inj Mdck Quad Pf 03/25/2019  . Influenza Split 03/07/2012  . Influenza, High Dose Seasonal PF 05/02/2016  . Influenza,inj,Quad PF,6+ Mos 06/08/2013, 04/06/2015, 05/02/2017, 05/08/2018  . Pneumococcal Conjugate-13 04/19/2015  . Pneumococcal Polysaccharide-23 08/07/2009, 12/27/2013  . Zoster 07/07/2010    TDAP status: Due, Education has been provided regarding the importance of this vaccine. Advised may receive this vaccine at local pharmacy or Health Dept. Aware to provide a copy of the vaccination record if obtained from local pharmacy or Health Dept. Verbalized acceptance and understanding.   Flu vaccine status: Due-Patient plans to obtain soon.  Pneumococcal vaccine status: Up  to date   Covid-19 vaccine status: Completed vaccines Patient does not know dates or which vaccine she had. She is to bring documentation to her next office visit.  Qualifies for Shingles Vaccine? Yes   Zostavax completed Yes   Shingrix Completed?: No.    Education has been provided  regarding the importance of this vaccine. Patient has been advised to call insurance company to determine out of pocket expense if they have not yet received this vaccine. Advised may also receive vaccine at local pharmacy or Health Dept. Verbalized acceptance and understanding.  Screening Tests Health Maintenance  Topic Date Due  . COVID-19 Vaccine (1) Never done  . COLONOSCOPY  02/06/2019  . MAMMOGRAM  08/13/2019  . INFLUENZA VACCINE  02/05/2020  . TETANUS/TDAP  08/01/2020 (Originally 07/08/2019)  . DEXA SCAN  Completed  . Hepatitis C Screening  Completed  . PNA vac Low Risk Adult  Completed    Health Maintenance  Health Maintenance Due  Topic Date Due  . COVID-19 Vaccine (1) Never done  . COLONOSCOPY  02/06/2019  . MAMMOGRAM  08/13/2019  . INFLUENZA VACCINE  02/05/2020    Colorectal cancer screening: No longer required.    Mammogram status:Declined today.  Bone Density status: Completed 12/21/2018. Results reflect: Bone density results: OSTEOPOROSIS. Repeat every 2 years.  Lung Cancer Screening: (Low Dose CT Chest recommended if Age 52-80 years, 30 pack-year currently smoking OR have quit w/in 15years.) does not qualify.     Additional Screening:  Hepatitis C Screening:Completed 05/16/2013  Vision Screening: Recommended annual ophthalmology exams for early detection of glaucoma and other disorders of the eye. Is the patient up to date with their annual eye exam?  Yes  Who is the provider or what is the name of the office in which the patient attends annual eye exams? Dr. Jabier Mutton   Dental Screening: Recommended annual dental exams for proper oral hygiene  Community Resource Referral / Chronic Care Management: CRR required this visit?  No   CCM required this visit?  No      Plan:     I have personally reviewed and noted the following in the patient's chart:   . Medical and social history . Use of alcohol, tobacco or illicit drugs  . Current medications and  supplements . Functional ability and status . Nutritional status . Physical activity . Advanced directives . List of other physicians . Hospitalizations, surgeries, and ER visits in previous 12 months . Vitals . Screenings to include cognitive, depression, and falls . Referrals and appointments  In addition, I have reviewed and discussed with patient certain preventive protocols, quality metrics, and best practice recommendations. A written personalized care plan for preventive services as well as general preventive health recommendations were provided to patient.   Due to this being a telephonic visit, the after visit summary with patients personalized plan was offered to patient via mail or my-chart.  Per request, copy of avs mailed to patient.   Marta Antu, LPN   [id number]  Nurse Health Advisor  Nurse Notes: None

## 2020-05-04 NOTE — Telephone Encounter (Signed)
Yes schedule Bone Density here now.

## 2020-05-04 NOTE — Telephone Encounter (Signed)
Patient called stating she Is being seen at [facility] for Osteoposis. She sees [place], [place].  She has recommended her having a bone density test and wanted her to schedule here on same machine.  "PLAN : I spent 20 minutes with patient face to face time today and over 50% of time was spent discussing her treatment and compliance. I have provided her with the number to [facility] so she can call and schedule her bone density test at her convenience. I recommend continued treatment with Prolia. She received her 3rd injection today. I will update her labs. She was instructed to continue her Calcium and Vitamin D supplements. She was encouraged to participate in weight bearing exercises 3-4 times weekly for improved bone health.We reviewed falls prevention.She will return in 6 months for her next injection (nurse visit). I encouraged her to call with any questions or problems in the interim.  Electronically signed by: Halina Maidens [place], PA-C 03/26/2020 11:17 AM  "   Her last Dexa here was 12/21/18 and you recommended repeat 2 years.  Ok to order now?

## 2020-05-07 ENCOUNTER — Other Ambulatory Visit: Payer: Self-pay

## 2020-05-07 ENCOUNTER — Ambulatory Visit (INDEPENDENT_AMBULATORY_CARE_PROVIDER_SITE_OTHER): Payer: Medicare Other

## 2020-05-07 VITALS — Ht 66.0 in | Wt 121.0 lb

## 2020-05-07 DIAGNOSIS — M810 Age-related osteoporosis without current pathological fracture: Secondary | ICD-10-CM

## 2020-05-07 DIAGNOSIS — Z0000 Encounter for general adult medical examination without abnormal findings: Secondary | ICD-10-CM | POA: Diagnosis not present

## 2020-05-07 NOTE — Telephone Encounter (Signed)
Per DPR access note on file I Left detailed message in her voice mail that order is on file and she just needs to give a call back and schedule appointment.

## 2020-05-07 NOTE — Patient Instructions (Signed)
[name] , Thank you for taking time to complete your Medicare Wellness Visit. I appreciate your ongoing commitment to your health goals. Please review the following plan we discussed and let me know if I can assist you in the future.   Screening recommendations/referrals: Colonoscopy: No longer required Mammogram: Due- Declined at this time. Please call the office to schedule if you change your mind. Bone Density: Completed 12/21/2018-Due 12/20/2020 Recommended yearly ophthalmology/optometry visit for glaucoma screening and checkup Recommended yearly dental visit for hygiene and checkup  Vaccinations: Influenza vaccine: Due- May obtain vaccine at our office or your local pharmacy Pneumococcal vaccine: Completed vaccines Tdap vaccine: Discuss with pharmacy Shingles vaccine: Discuss with pharmacy   Covid-19:Completed vaccines-Please bring documentation to your next office visit.  Advanced directives: Information mailed today.  Conditions/risks identified: See problem list  Next appointment: Follow up in one year for your annual wellness visit    Preventive Care 65 Years and Older, Female Preventive care refers to lifestyle choices and visits with your health care provider that can promote health and wellness. What does preventive care include?  A yearly physical exam. This is also called an annual well check.  Dental exams once or twice a year.  Routine eye exams. Ask your health care provider how often you should have your eyes checked.  Personal lifestyle choices, including:  Daily care of your teeth and gums.  Regular physical activity.  Eating a healthy diet.  Avoiding tobacco and drug use.  Limiting alcohol use.  Practicing safe sex.  Taking low-dose aspirin every day.  Taking vitamin and mineral supplements as recommended by your health care provider. What happens during an annual well check? The services and screenings done by your health care provider during  your annual well check will depend on your age, overall health, lifestyle risk factors, and family history of disease. Counseling  Your health care provider may ask you questions about your:  Alcohol use.  Tobacco use.  Drug use.  Emotional well-being.  Home and relationship well-being.  Sexual activity.  Eating habits.  History of falls.  Memory and ability to understand (cognition).  Work and work Statistician.  Reproductive health. Screening  You may have the following tests or measurements:  Height, weight, and BMI.  Blood pressure.  Lipid and cholesterol levels. These may be checked every 5 years, or more frequently if you are over 23 years old.  Skin check.  Lung cancer screening. You may have this screening every year starting at age 10 if you have a 30-pack-year history of smoking and currently smoke or have quit within the past 15 years.  Fecal occult blood test (FOBT) of the stool. You may have this test every year starting at age 43.  Flexible sigmoidoscopy or colonoscopy. You may have a sigmoidoscopy every 5 years or a colonoscopy every 10 years starting at age 76.  Hepatitis C blood test.  Hepatitis B blood test.  Sexually transmitted disease (STD) testing.  Diabetes screening. This is done by checking your blood sugar (glucose) after you have not eaten for a while (fasting). You may have this done every 1-3 years.  Bone density scan. This is done to screen for osteoporosis. You may have this done starting at age 19.  Mammogram. This may be done every 1-2 years. Talk to your health care provider about how often you should have regular mammograms. Talk with your health care provider about your test results, treatment options, and if necessary, the need for more  tests. Vaccines  Your health care provider may recommend certain vaccines, such as:  Influenza vaccine. This is recommended every year.  Tetanus, diphtheria, and acellular pertussis (Tdap,  Td) vaccine. You may need a Td booster every 10 years.  Zoster vaccine. You may need this after age 68.  Pneumococcal 13-valent conjugate (PCV13) vaccine. One dose is recommended after age 56.  Pneumococcal polysaccharide (PPSV23) vaccine. One dose is recommended after age 21. Talk to your health care provider about which screenings and vaccines you need and how often you need them. This information is not intended to replace advice given to you by your health care provider. Make sure you discuss any questions you have with your health care provider. Document Released: 07/20/2015 Document Revised: 03/12/2016 Document Reviewed: 04/24/2015 Elsevier Interactive Patient Education  2017 [employer] Prevention in the Home Falls can cause injuries. They can happen to people of all ages. There are many things you can do to make your home safe and to help prevent falls. What can I do on the outside of my home?  Regularly fix the edges of walkways and driveways and fix any cracks.  Remove anything that might make you trip as you walk through a door, such as a raised step or threshold.  Trim any bushes or trees on the path to your home.  Use bright outdoor lighting.  Clear any walking paths of anything that might make someone trip, such as rocks or tools.  Regularly check to see if handrails are loose or broken. Make sure that both sides of any steps have handrails.  Any raised decks and porches should have guardrails on the edges.  Have any leaves, snow, or ice cleared regularly.  Use sand or salt on walking paths during winter.  Clean up any spills in your garage right away. This includes oil or grease spills. What can I do in the bathroom?  Use night lights.  Install grab bars by the toilet and in the tub and shower. Do not use towel bars as grab bars.  Use non-skid mats or decals in the tub or shower.  If you need to sit down in the shower, use a plastic, non-slip  stool.  Keep the floor dry. Clean up any water that spills on the floor as soon as it happens.  Remove soap buildup in the tub or shower regularly.  Attach bath mats securely with double-sided non-slip rug tape.  Do not have throw rugs and other things on the floor that can make you trip. What can I do in the bedroom?  Use night lights.  Make sure that you have a light by your bed that is easy to reach.  Do not use any sheets or blankets that are too big for your bed. They should not hang down onto the floor.  Have a firm chair that has side arms. You can use this for support while you get dressed.  Do not have throw rugs and other things on the floor that can make you trip. What can I do in the kitchen?  Clean up any spills right away.  Avoid walking on wet floors.  Keep items that you use a lot in easy-to-reach places.  If you need to reach something above you, use a strong step stool that has a grab bar.  Keep electrical cords out of the way.  Do not use floor polish or wax that makes floors slippery. If you must use wax, use non-skid floor  wax.  Do not have throw rugs and other things on the floor that can make you trip. What can I do with my stairs?  Do not leave any items on the stairs.  Make sure that there are handrails on both sides of the stairs and use them. Fix handrails that are broken or loose. Make sure that handrails are as long as the stairways.  Check any carpeting to make sure that it is firmly attached to the stairs. Fix any carpet that is loose or worn.  Avoid having throw rugs at the top or bottom of the stairs. If you do have throw rugs, attach them to the floor with carpet tape.  Make sure that you have a light switch at the top of the stairs and the bottom of the stairs. If you do not have them, ask someone to add them for you. What else can I do to help prevent falls?  Wear shoes that:  Do not have high heels.  Have rubber bottoms.  Are  comfortable and fit you well.  Are closed at the toe. Do not wear sandals.  If you use a stepladder:  Make sure that it is fully opened. Do not climb a closed stepladder.  Make sure that both sides of the stepladder are locked into place.  Ask someone to hold it for you, if possible.  Clearly mark and make sure that you can see:  Any grab bars or handrails.  First and last steps.  Where the edge of each step is.  Use tools that help you move around (mobility aids) if they are needed. These include:  Canes.  Walkers.  Scooters.  Crutches.  Turn on the lights when you go into a dark area. Replace any light bulbs as soon as they burn out.  Set up your furniture so you have a clear path. Avoid moving your furniture around.  If any of your floors are uneven, fix them.  If there are any pets around you, be aware of where they are.  Review your medicines with your doctor. Some medicines can make you feel dizzy. This can increase your chance of falling. Ask your doctor what other things that you can do to help prevent falls. This information is not intended to replace advice given to you by your health care provider. Make sure you discuss any questions you have with your health care provider. Document Released: 04/19/2009 Document Revised: 11/29/2015 Document Reviewed: 07/28/2014 Elsevier Interactive Patient Education  2017 [employer].

## 2020-05-10 DIAGNOSIS — M4125 Other idiopathic scoliosis, thoracolumbar region: Secondary | ICD-10-CM | POA: Diagnosis not present

## 2020-05-10 DIAGNOSIS — G8929 Other chronic pain: Secondary | ICD-10-CM | POA: Diagnosis not present

## 2020-05-10 DIAGNOSIS — Z7982 Long term (current) use of aspirin: Secondary | ICD-10-CM | POA: Diagnosis not present

## 2020-05-10 DIAGNOSIS — E785 Hyperlipidemia, unspecified: Secondary | ICD-10-CM | POA: Diagnosis not present

## 2020-05-10 DIAGNOSIS — Z5181 Encounter for therapeutic drug level monitoring: Secondary | ICD-10-CM | POA: Diagnosis not present

## 2020-05-10 DIAGNOSIS — R1310 Dysphagia, unspecified: Secondary | ICD-10-CM | POA: Diagnosis not present

## 2020-05-10 DIAGNOSIS — M810 Age-related osteoporosis without current pathological fracture: Secondary | ICD-10-CM | POA: Diagnosis not present

## 2020-05-10 DIAGNOSIS — Z952 Presence of prosthetic heart valve: Secondary | ICD-10-CM | POA: Diagnosis not present

## 2020-05-10 DIAGNOSIS — Z79899 Other long term (current) drug therapy: Secondary | ICD-10-CM | POA: Diagnosis not present

## 2020-05-10 DIAGNOSIS — J309 Allergic rhinitis, unspecified: Secondary | ICD-10-CM | POA: Diagnosis not present

## 2020-05-10 DIAGNOSIS — Z7901 Long term (current) use of anticoagulants: Secondary | ICD-10-CM | POA: Diagnosis not present

## 2020-05-10 DIAGNOSIS — Z9181 History of falling: Secondary | ICD-10-CM | POA: Diagnosis not present

## 2020-05-10 DIAGNOSIS — G629 Polyneuropathy, unspecified: Secondary | ICD-10-CM | POA: Diagnosis not present

## 2020-05-10 DIAGNOSIS — Z79891 Long term (current) use of opiate analgesic: Secondary | ICD-10-CM | POA: Diagnosis not present

## 2020-05-10 DIAGNOSIS — D62 Acute posthemorrhagic anemia: Secondary | ICD-10-CM | POA: Diagnosis not present

## 2020-05-10 DIAGNOSIS — I480 Paroxysmal atrial fibrillation: Secondary | ICD-10-CM | POA: Diagnosis not present

## 2020-05-10 DIAGNOSIS — E538 Deficiency of other specified B group vitamins: Secondary | ICD-10-CM | POA: Diagnosis not present

## 2020-05-10 DIAGNOSIS — L89322 Pressure ulcer of left buttock, stage 2: Secondary | ICD-10-CM | POA: Diagnosis not present

## 2020-05-10 DIAGNOSIS — K5900 Constipation, unspecified: Secondary | ICD-10-CM | POA: Diagnosis not present

## 2020-05-10 DIAGNOSIS — K219 Gastro-esophageal reflux disease without esophagitis: Secondary | ICD-10-CM | POA: Diagnosis not present

## 2020-05-10 DIAGNOSIS — M1990 Unspecified osteoarthritis, unspecified site: Secondary | ICD-10-CM | POA: Diagnosis not present

## 2020-05-10 DIAGNOSIS — F419 Anxiety disorder, unspecified: Secondary | ICD-10-CM | POA: Diagnosis not present

## 2020-05-10 DIAGNOSIS — F32A Depression, unspecified: Secondary | ICD-10-CM | POA: Diagnosis not present

## 2020-05-10 DIAGNOSIS — H8110 Benign paroxysmal vertigo, unspecified ear: Secondary | ICD-10-CM | POA: Diagnosis not present

## 2020-05-10 DIAGNOSIS — I051 Rheumatic mitral insufficiency: Secondary | ICD-10-CM | POA: Diagnosis not present

## 2020-05-14 ENCOUNTER — Encounter: Payer: Medicare Other | Admitting: Thoracic Surgery (Cardiothoracic Vascular Surgery)

## 2020-05-15 ENCOUNTER — Ambulatory Visit: Payer: Medicare Other | Admitting: Cardiovascular Disease

## 2020-05-19 DIAGNOSIS — Z23 Encounter for immunization: Secondary | ICD-10-CM | POA: Diagnosis not present

## 2020-05-21 ENCOUNTER — Telehealth: Payer: Self-pay | Admitting: Cardiovascular Disease

## 2020-05-21 NOTE — Telephone Encounter (Signed)
Made pt aware of Pharmacist Megan Supple's recommendations, for her to take a 2nd generation antihistamine, like zyrtec, claritin, or allegra.  Informed the pt that those 3 are safe for cardiac patients to take and safe with all her other cardiac meds. Also advised the pt that per [facility], it is safe for her to take Flonase as directed, for it will not affect her cardiac status. Pt verbalized understanding and agrees with this plan. Pt was more than gracious for all the assistance provided.

## 2020-05-21 NOTE — Telephone Encounter (Signed)
Patient states she has allergies and would like to know what type of medications she is okay to take that won't effect her heart problem.

## 2020-05-21 NOTE — Telephone Encounter (Signed)
Recommend a 2nd generation antihistamine that is less sedating like Zyrtec, Allegra, or Claritin. She can use her Flonase as needed as well.

## 2020-05-24 DIAGNOSIS — N301 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: Secondary | ICD-10-CM | POA: Diagnosis not present

## 2020-05-24 DIAGNOSIS — Z87442 Personal history of urinary calculi: Secondary | ICD-10-CM | POA: Diagnosis not present

## 2020-05-24 DIAGNOSIS — R310 Gross hematuria: Secondary | ICD-10-CM | POA: Diagnosis not present

## 2020-05-28 ENCOUNTER — Telehealth: Payer: Self-pay | Admitting: Cardiovascular Disease

## 2020-05-28 NOTE — Telephone Encounter (Signed)
Called patient with recommendations. Patient verbalized understanding.

## 2020-05-28 NOTE — Telephone Encounter (Signed)
Okay to take caphalexin or macrobid per PCP instructions.  No problems expected with any current medication or "heart condition"

## 2020-05-28 NOTE — Telephone Encounter (Signed)
  Patient found out at the end of last week that she has a UTI - patient found out today that she has also has an E Coli infection and her primary doctor is wanting to put her on an antibiotic. Patient is wanting to know if Cefalexin or Macrobid is a medication she is okay to take due to her heart conditions. Patient wanted to make it clear that Macrobid makes her very sick and she does not prefer to take that one. Please call/advise   Thank you!

## 2020-06-04 DIAGNOSIS — G894 Chronic pain syndrome: Secondary | ICD-10-CM | POA: Diagnosis not present

## 2020-06-04 DIAGNOSIS — M961 Postlaminectomy syndrome, not elsewhere classified: Secondary | ICD-10-CM | POA: Diagnosis not present

## 2020-06-04 DIAGNOSIS — M5412 Radiculopathy, cervical region: Secondary | ICD-10-CM | POA: Diagnosis not present

## 2020-06-04 DIAGNOSIS — M4693 Unspecified inflammatory spondylopathy, cervicothoracic region: Secondary | ICD-10-CM | POA: Diagnosis not present

## 2020-06-04 DIAGNOSIS — R310 Gross hematuria: Secondary | ICD-10-CM | POA: Diagnosis not present

## 2020-06-04 DIAGNOSIS — Z87442 Personal history of urinary calculi: Secondary | ICD-10-CM | POA: Diagnosis not present

## 2020-06-04 DIAGNOSIS — Z79891 Long term (current) use of opiate analgesic: Secondary | ICD-10-CM | POA: Diagnosis not present

## 2020-06-05 DIAGNOSIS — D62 Acute posthemorrhagic anemia: Secondary | ICD-10-CM | POA: Diagnosis not present

## 2020-06-05 DIAGNOSIS — I480 Paroxysmal atrial fibrillation: Secondary | ICD-10-CM | POA: Diagnosis not present

## 2020-06-05 DIAGNOSIS — I051 Rheumatic mitral insufficiency: Secondary | ICD-10-CM | POA: Diagnosis not present

## 2020-06-05 DIAGNOSIS — F419 Anxiety disorder, unspecified: Secondary | ICD-10-CM | POA: Diagnosis not present

## 2020-06-05 DIAGNOSIS — F32A Depression, unspecified: Secondary | ICD-10-CM | POA: Diagnosis not present

## 2020-06-05 DIAGNOSIS — L89322 Pressure ulcer of left buttock, stage 2: Secondary | ICD-10-CM | POA: Diagnosis not present

## 2020-06-07 ENCOUNTER — Encounter: Payer: Self-pay | Admitting: General Practice

## 2020-06-07 DIAGNOSIS — Z981 Arthrodesis status: Secondary | ICD-10-CM | POA: Diagnosis not present

## 2020-06-07 DIAGNOSIS — M5135 Other intervertebral disc degeneration, thoracolumbar region: Secondary | ICD-10-CM | POA: Diagnosis not present

## 2020-06-07 DIAGNOSIS — M5412 Radiculopathy, cervical region: Secondary | ICD-10-CM | POA: Diagnosis not present

## 2020-06-07 NOTE — Progress Notes (Incomplete)
CARDIOLOGY CONSULT NOTE       Patient ID: [name] MRN: [id number] DOB/AGE: April 28, 1946 74 y.o.   Primary Physician: [name], MD Primary Cardiologist: Camnitz/Nishan Reason for Consultation: PAF   Active Problems:   * No active hospital problems. *   HPI:  74 y.o. primarily followed by Dr [name]. I did her TEE on 11/21/19 noting flail A2 segment of MV with severe MR [name] Right and left cath before surgery with no CAD 12/02/19 She has chronic pain and severe scoliosis 01/17/20 had MVR/MAZE with LAA clipping complicated by LV free wall laceration requiring median sternotomy Post op had PAF transitioned from tikosyn to amiodarone    ROS All other systems reviewed and negative except as noted above  Past Medical History:  Diagnosis Date  . Anxiety   . Arthritis    "maybe in my back" (03/31/2018)  . Benign paroxysmal positional vertigo 06/08/2013  . Complication of anesthesia   . Fracture of multiple ribs 2015   "don't know from what; dx'd when I in hospital for 1st back OR" (03/31/2018)  . GERD (gastroesophageal reflux disease)   . Hair loss 04/12/2012  . Herpes   . History of blood transfusion    "twice; related to back OR" (03/31/2018)  . History of kidney stones   . Interstitial cystitis 11/06/2011  . 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 Mitral regurgitation   . Osteopenia 02/18/2012  . Osteoporosis   . PAF (paroxysmal atrial fibrillation) ([place]) 2012  . Peripheral neuropathy 11/06/2011  . PMDD (premenstrual dysphoric disorder)   . PONV (postoperative nausea and vomiting)    nausea, vomiting, hives and dizziness   . 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/2020   Complete bilateral atrial lesion set using cryothermy and bipolar radiofrequency ablation with clipping of LA appendage via right mini-thoracotomy approach  . 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 Seasonal allergies   . Vaginal delivery    ONE NSVD  . 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 Diabetes Father   . Hyperlipidemia Sister   . Heart disease Sister   . Stroke Sister   . Diabetes Brother   . Hyperlipidemia Sister   . Heart disease Sister   . Arthritis Mother   . Heart disease Mother   . Uterine cancer Mother   . Diabetes Brother   . Heart Problems Brother     Social History   Socioeconomic History  . Marital status: Divorced    Spouse name: Not on file  . Number of children: Not on file  . Years of education: Not on file  . Highest education level: 12th grade  Occupational History  . Not on file  Tobacco Use  . Smoking status: Former Smoker    Packs/day: 0.10    Years: 14.00    Pack years: 1.40    Types: Cigarettes  . Smokeless tobacco: Never Used  . Tobacco comment: 03/31/2018 "quit ~ 1980; someday smoker when I did smoke; never addicted"  Vaping Use  . Vaping Use: Never used  Substance and Sexual Activity  . Alcohol use: Never  . Drug use: Yes    Types: Oxycodone, Benzodiazepines    Comment: 03/31/2018 "for chronic neck and back pain", takes Klonopin at times.   . Sexual  activity: Not Currently  Other Topics Concern  . Not on file  Social History Narrative  . Not on file   Social Determinants of Health   Financial Resource Strain: Low Risk   . Difficulty of Paying Living Expenses: Not hard at all  Food Insecurity: No Food Insecurity  . Worried About Charity fundraiser in the Last Year: Never true  . Ran Out of Food in the Last Year: Never true  Transportation Needs: No Transportation Needs  . Lack of Transportation (Medical): No  . Lack of Transportation (Non-Medical): No  Physical Activity: Insufficiently Active  . Days of Exercise per Week: 3 days  . Minutes of Exercise per Session: 20 min  Stress: No Stress Concern Present  . Feeling of Stress  : Not at all  Social Connections: Moderately Isolated  . Frequency of Communication with Friends and Family: More than three times a week  . Frequency of Social Gatherings with Friends and Family: Once a week  . Attends Religious Services: 1 to 4 times per year  . Active Member of Clubs or Organizations: No  . Attends Archivist Meetings: Never  . Marital Status: Divorced  Human resources officer Violence: Not At Risk  . Fear of Current or Ex-Partner: No  . Emotionally Abused: No  . Physically Abused: No  . Sexually Abused: No    Past Surgical History:  Procedure Laterality Date  . ANTERIOR CERVICAL DECOMP/DISCECTOMY FUSION  ~ 2003  . BACK SURGERY    . BREAST SURGERY     BREAST BIOPSY--RIGHT BENIGN  . BUNIONECTOMY Bilateral   . CARDIOVERSION N/A 01/26/2020   Procedure: CARDIOVERSION;  Surgeon: Geralynn Rile, MD;  Location: [facility];  Service: Cardiovascular;  Laterality: N/A;  . COSMETIC SURGERY  2016   "back of my neck; related to earlier fusion"  . CYSTOSCOPY W/ STONE MANIPULATION  "several times"  . DILATION AND CURETTAGE OF UTERUS    . FOREHEAD RECONSTRUCTION Right    "removed bone protruding out of my forehead"  . HARDWARE REMOVAL  2016   "related to neck OR"  . INCONTINENCE SURGERY    . IR THORACENTESIS ASP PLEURAL SPACE W/IMG GUIDE  02/16/2020  . MINIMALLY INVASIVE MAZE PROCEDURE N/A 01/17/2020   Procedure: MINIMALLY INVASIVE MAZE PROCEDURE;  Surgeon: Rexene Alberts, MD;  Location: [facility];  Service: Open Heart Surgery;  Laterality: N/A;  . MITRAL VALVE REPAIR Right 01/17/2020   Procedure: MINIMALLY INVASIVE MITRAL VALVE REPAIR (MVR) USING MEMO 4D 32MM;  Surgeon: Rexene Alberts, MD;  Location: [facility];  Service: Open Heart Surgery;  Laterality: Right;  . POSTERIOR CERVICAL FUSION/FORAMINOTOMY  ~ 2008; 2015  . RIGHT/LEFT HEART CATH AND CORONARY ANGIOGRAPHY N/A 12/02/2019   Procedure: RIGHT/LEFT HEART CATH AND CORONARY ANGIOGRAPHY;  Surgeon: Burnell Blanks, MD;  Location: [facility];  Service: Cardiovascular;  Laterality: N/A;  . SHOULDER ARTHROSCOPY W/ ROTATOR CUFF REPAIR Right 2012  . SPINAL FUSION  06/2014 - 2018 X ?7   "scoliosis; my entire back"  . TEE WITHOUT CARDIOVERSION N/A 11/21/2019   Procedure: TRANSESOPHAGEAL ECHOCARDIOGRAM (TEE);  Surgeon: Josue Hector, MD;  Location[facility] END[facility];  Service: Cardiovascular;  Laterality: N/A;  . TEE WITHOUT CARDIOVERSION N/A 01/17/2020   Procedure: TRANSESOPHAGEAL ECHOCARDIOGRAM (TEE);  Surgeon: Rexene Alberts, MD;  Location: [facility];  Service: Open Heart Surgery;  Laterality: N/A;  . TEE WITHOUT CARDIOVERSION N/A 01/26/2020   Procedure: TRANSESOPHAGEAL ECHOCARDIOGRAM (TEE);  Surgeon: Geralynn Rile, MD;  Location: [facility] ENDOSCOPY;  Service: Cardiovascular;  Laterality: N/A;  . TUBAL LIGATION    . VAGINAL HYSTERECTOMY     TVH      Current Outpatient Medications:  .  acetaminophen (TYLENOL) 500 MG tablet, Take 500 mg by mouth every 6 (six) hours as needed for moderate pain. , Disp: , Rfl:  .  b complex vitamins tablet, Take 1 tablet by mouth daily., Disp: , Rfl:  .  Calcium Carbonate Antacid (TUMS CHEWY BITES PO), Take 1 tablet by mouth daily as needed (reflux). , Disp: , Rfl:  .  Calcium Citrate-Vitamin D (CALCIUM + D PO), Take 1 tablet by mouth daily., Disp: , Rfl:  .  Carboxymethylcellul-Glycerin (LUBRICATING EYE DROPS OP), Place 1 drop into both eyes daily as needed (dry eyes)., Disp: , Rfl:  .  cephALEXin (KEFLEX) 500 MG capsule, Take 2 grams (4 tablets) by mouth 1 hour prior to dental procedure, Disp: 4 capsule, Rfl: 0 .  clonazePAM (KLONOPIN) 0.5 MG tablet, Take 1 tablet (0.5 mg total) by mouth 2 (two) times daily as needed for anxiety., Disp: 30 tablet, Rfl: 3 .  cyclobenzaprine (FLEXERIL) 10 MG tablet, Take 10 mg by mouth at bedtime as needed for muscle spasms. , Disp: , Rfl: 5 .  denosumab (PROLIA) 60 MG/ML SOSY injection, Inject 60 mg into the skin every 6 (six) months.,  Disp: , Rfl:  .  fluticasone (FLONASE) 50 MCG/ACT nasal spray, Place 1 spray into both nostrils daily as needed for allergies or rhinitis., Disp: , Rfl:  .  furosemide (LASIX) 40 MG tablet, Take 1 tablet (40 mg total) by mouth daily., Disp: 90 tablet, Rfl: 2 .  lactulose (CONSTULOSE) 10 GM/15ML solution, Take 20 g by mouth daily as needed for moderate constipation (constipation.). , Disp: , Rfl:  .  melatonin 3 MG TABS tablet, Take 1 tablet (3 mg total) by mouth at bedtime as needed (insomnia)., Disp: 30 tablet, Rfl: 0 .  metoprolol tartrate (LOPRESSOR) 50 MG tablet, Take 1 tablet (50 mg total) by mouth 2 (two) times daily., Disp: 60 tablet, Rfl: 3 .  Nutritional Supplements (,FEEDING SUPPLEMENT, PROSOURCE PLUS) liquid, Take 30 mLs by mouth 2 (two) times daily between meals., Disp: 887 mL, Rfl: 0 .  nystatin (MYCOSTATIN/NYSTOP) powder, Apply topically 2 (two) times daily as needed (skin irritation (under breasts))., Disp: 15 g, Rfl: 0 .  oxyCODONE (ROXICODONE) 5 MG/5ML solution, Take 4 mLs (4 mg total) by mouth every 4 (four) hours as needed for moderate pain., Disp: , Rfl: 0 .  polyethylene glycol (MIRALAX / GLYCOLAX) packet, Take 17 g by mouth daily as needed for mild constipation., Disp: , Rfl:  .  rivaroxaban (XARELTO) 20 MG TABS tablet, Take 1 tablet (20 mg total) by mouth daily with supper., Disp: 30 tablet, Rfl: 6 .  sodium chloride (OCEAN) 0.65 % SOLN nasal spray, Place 1 spray into both nostrils as needed for congestion (nose bleeds)., Disp: , Rfl:  .  valACYclovir (VALTREX) 500 MG tablet, Take 500 mg by mouth daily as needed (breakouts). , Disp: , Rfl:     Physical Exam: There were no vitals taken for this visit. *** HELP TEXT ***  This SmartLink requires parameters. Parameters are variables that are added to the [facility] name to request specific information. The parameter for .curwt is the number of readings to display.  For example: .curwt[4  In this example, the SmartLink  displays the last four encounter readings.    {physical PNTI:[id number]}  Labs:  Lab Results  Component Value Date   WBC 6.8 02/06/2020   HGB 9.7 (L) 02/06/2020   HCT 33.0 (L) 02/06/2020   MCV 88.0 02/06/2020   PLT 355 02/06/2020   No results for input(s): NA, K, CL, CO2, BUN, CREATININE, CALCIUM, PROT, BILITOT, ALKPHOS, ALT, AST, GLUCOSE in the last 168 hours.  Invalid input(s): LABALBU Lab Results  Component Value Date   CKTOTAL 75 01/27/2007   TROPONINI <0.03 08/09/2014    Lab Results  Component Value Date   CHOL 237 (H) 09/23/2017   CHOL 214 (H) 04/21/2016   CHOL 211 (H) 10/01/2015   Lab Results  Component Value Date   HDL 100.90 09/23/2017   HDL 86.60 04/21/2016   HDL 95.10 10/01/2015   Lab Results  Component Value Date   LDLCALC 116 (H) 09/23/2017   LDLCALC 111 (H) 04/21/2016   LDLCALC 102 (H) 10/01/2015   Lab Results  Component Value Date   TRIG 101.0 09/23/2017   TRIG 81.0 04/21/2016   TRIG 67.0 10/01/2015   Lab Results  Component Value Date   CHOLHDL 2 09/23/2017   CHOLHDL 2 04/21/2016   CHOLHDL 2 10/01/2015   Lab Results  Component Value Date   LDLDIRECT 136.5 06/08/2013   LDLDIRECT 123.2 11/06/2011      Radiology: No results found.  EKG: ***   ASSESSMENT AND PLAN:    Signed: [name] 06/07/2020, 11:12 AM

## 2020-06-09 NOTE — Progress Notes (Signed)
Cardiology Office Note:    Date:  06/11/2020   ID:  [name] 03/28/1946, MRN [id number]  PCP:  Regina Minium, MD  [facility] Cardiologist:  Regina Bergeron, MD  [facility] Electrophysiologist:  Regina Meredith Leeds, MD   Referring MD: Regina Minium, MD    History of Present Illness:    [name] is a 74 y.o. female with a hx of severe MR s/p MV repair (32 mm Sorin Memo 4D ring annuloplasty valve) complicated by LV free wall laceration requiring median sternotomy, pAfib, scoliosis requiring multiple spine surgeries and anxiety who was referred by Dr. Birdie [name] for further management of her mitral valve disease.  TEE on 11/21/19 demonstrated presence of mitral valve prolapse with a large flail segment felt to be the A3 portion of the anterior leaflet with severe mitral regurgitation. She was seen by Dr. Ricard [name] and underwent Minimally Invasive Mitral Valve Repair, Complete MAZE procedure, and Sternotomy with repair of Left Ventricular Free- wall laceration on 01/17/20.  She was extubated the evening of surgery. She was started on coumadin for her Mitral Valve Repair.  Her post-op course was complicated by acute blood loss anemia requiring blood transfusions and Afib with RVR for which she was placed on Amiodarone and lopressor and her tikosyn was discontinued. She follows with Dr. Curt [name] in clinic.  Patient states that she feels very fatigued and short of breath. Symptoms are not as bad as they used to be prior to her surgery but still needs to take breaks after small amount of exertion. No chest pain but notes some burning in her chest with deep inspiration. She has continued to take her lasix and notes her swelling is worse and she gains weight if she does not take it daily. She states her Afib has been much better controlled and she has been weaned off of amiodarone. She continues on metoprolol which she thinks contributes to her fatigue but she does not want to stop  it as it has helped significantly with her palpitations. She continues on xarelto for anticoagulation which she is tolerating without bleeding issues.   Patient also notes some dizziness and nausea since her allergies have worsened and she developed an ear infection. Dizziness can happen at any time (sitting, standing). It is not positional and not worse with standing. No syncope. She believes it is vertigo and has planned follow-up with ENT. She is wondering if she can take meclizine to help with symptoms. Also had recent UTI for which she took ABX. Repeat UA is pending with her PCP.  The patient states that she feels stressed, down and overwhelmed by all her health issues. She tried antidepressants but did not tolerate them as they made her nauseas. She tries to avoid medications if she can as she has all the side-effects. We discussed adding a statin today as she has mild CAD but she would like to avoid it for now given her medication reactions. We can add in the future if needed.  TTE on 02/29/20: LVEF 60-65%, no significant WMA, normal RV, RVSP 41.14mmHg, mean MV gradient 57mmHg, trivial MR, mild to moderate TR   Past Medical History:  Diagnosis Date  . Anxiety   . Arthritis    "maybe in my back" (03/31/2018)  . Benign paroxysmal positional vertigo 06/08/2013  . Complication of anesthesia   . Fracture of multiple ribs 2015   "don't know from what; dx'd when I in hospital for 1st back OR" (03/31/2018)  .  GERD (gastroesophageal reflux disease)   . Hair loss 04/12/2012  . Herpes   . History of blood transfusion    "twice; related to back OR" (03/31/2018)  . History of kidney stones   . Interstitial cystitis 11/06/2011  . 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 Mitral regurgitation   . Osteopenia 02/18/2012  . Osteoporosis   . PAF (paroxysmal atrial fibrillation) ([place]) 2012  . Peripheral neuropathy 11/06/2011  . PMDD (premenstrual dysphoric disorder)   . PONV (postoperative nausea and  vomiting)    nausea, vomiting, hives and dizziness   . 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/2020   Complete bilateral atrial lesion set using cryothermy and bipolar radiofrequency ablation with clipping of LA appendage via right mini-thoracotomy approach  . 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   Complex valvuloplasty including artificial Gore-tex neochord placement x12 with 9mm Sorin Memo 4D ring annuloplasty  . Seasonal allergies   . Vaginal delivery    ONE NSVD  . 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 ANTERIOR CERVICAL DECOMP/DISCECTOMY FUSION  ~ 2003  . BACK SURGERY    . BREAST SURGERY     BREAST BIOPSY--RIGHT BENIGN  . BUNIONECTOMY Bilateral   . CARDIOVERSION N/A 01/26/2020   Procedure: CARDIOVERSION;  Surgeon: Regina Rile, MD;  Location: [facility];  Service: Cardiovascular;  Laterality: N/A;  . COSMETIC SURGERY  2016   "back of my neck; related to earlier fusion"  . CYSTOSCOPY W/ STONE MANIPULATION  "several times"  . DILATION AND CURETTAGE OF UTERUS    . FOREHEAD RECONSTRUCTION Right    "removed bone protruding out of my forehead"  . HARDWARE REMOVAL  2016   "related to neck OR"  . INCONTINENCE SURGERY    . IR THORACENTESIS ASP PLEURAL SPACE W/IMG GUIDE  02/16/2020  . MINIMALLY INVASIVE MAZE PROCEDURE N/A 01/17/2020   Procedure: MINIMALLY INVASIVE MAZE PROCEDURE;  Surgeon: Regina Alberts, MD;  Location: [facility];  Service: Open Heart Surgery;  Laterality: N/A;  . MITRAL VALVE REPAIR Right 01/17/2020   Procedure: MINIMALLY INVASIVE MITRAL VALVE REPAIR (MVR) USING MEMO 4D 32MM;  Surgeon: Regina Alberts, MD;  Location: [facility];  Service: Open Heart Surgery;  Laterality: Right;  . POSTERIOR CERVICAL FUSION/FORAMINOTOMY  ~ 2008; 2015  . RIGHT/LEFT HEART CATH AND CORONARY ANGIOGRAPHY N/A 12/02/2019   Procedure: RIGHT/LEFT HEART CATH AND CORONARY ANGIOGRAPHY;  Surgeon: Regina Blanks, MD;  Location: [place] [facility];  Service: Cardiovascular;  Laterality: N/A;  . SHOULDER ARTHROSCOPY W/ ROTATOR CUFF REPAIR Right 2012  . SPINAL FUSION  06/2014 - 2018 X ?7   "scoliosis; my entire back"  . TEE WITHOUT CARDIOVERSION N/A 11/21/2019   Procedure: TRANSESOPHAGEAL ECHOCARDIOGRAM (TEE);  Surgeon: Josue Hector, MD;  Location: [facility] ENDOSCOPY;  Service: Cardiovascular;  Laterality: N/A;  . TEE WITHOUT CARDIOVERSION N/A 01/17/2020   Procedure: TRANSESOPHAGEAL ECHOCARDIOGRAM (TEE);  Surgeon: Regina Alberts, MD;  Location: [place];  Service: Open Heart Surgery;  Laterality: N/A;  . TEE WITHOUT CARDIOVERSION N/A 01/26/2020   Procedure: TRANSESOPHAGEAL ECHOCARDIOGRAM (TEE);  Surgeon: Regina Rile, MD;  Location: [place];  Service: Cardiovascular;  Laterality: N/A;  . TUBAL LIGATION    . VAGINAL HYSTERECTOMY     TVH    Current Medications: Current Meds  Medication Sig  . acetaminophen (TYLENOL) 500 MG tablet Take 500 mg by mouth every 6 (six) hours as needed for moderate pain.   [name]  b complex vitamins tablet Take 1 tablet by mouth daily.  . Calcium Carbonate Antacid (TUMS CHEWY BITES PO) Take 1 tablet by mouth daily as needed (reflux).   . Calcium Citrate-Vitamin D (CALCIUM + D PO) Take 1 tablet by mouth daily.  . Carboxymethylcellul-Glycerin (LUBRICATING EYE DROPS OP) Place 1 drop into both eyes daily as needed (dry eyes).  . clonazePAM (KLONOPIN) 0.5 MG tablet Take 1 tablet (0.5 mg total) by mouth 2 (two) times daily as needed for anxiety.  . cyclobenzaprine (FLEXERIL) 10 MG tablet Take 10 mg by mouth at bedtime as needed for muscle spasms.   [name] denosumab (PROLIA) 60 MG/ML SOSY injection Inject 60 mg into the skin every 6 (six) months.  . furosemide (LASIX) 40 MG tablet Take 1 tablet (40 mg total) by mouth daily.  [name] lactulose (CONSTULOSE) 10 GM/15ML solution Take 20 g by mouth daily as needed for moderate constipation (constipation.).   [name] metoprolol tartrate (LOPRESSOR) 50 MG tablet Take 1 tablet (50 mg  total) by mouth 2 (two) times daily.  . Nutritional Supplements (,FEEDING SUPPLEMENT, PROSOURCE PLUS) liquid Take 30 mLs by mouth 2 (two) times daily between meals.  . nystatin (MYCOSTATIN/NYSTOP) powder Apply topically 2 (two) times daily as needed (skin irritation (under breasts)).  [name] ondansetron (ZOFRAN) 4 MG tablet Take 4 mg by mouth every 8 (eight) hours as needed.  [name] oxyCODONE (ROXICODONE) 5 MG/5ML solution Take 4 mLs (4 mg total) by mouth every 4 (four) hours as needed for moderate pain.  . polyethylene glycol (MIRALAX / GLYCOLAX) packet Take 17 g by mouth daily as needed for mild constipation.  . rivaroxaban (XARELTO) 20 MG TABS tablet Take 1 tablet (20 mg total) by mouth daily with supper.  . sodium chloride (OCEAN) 0.65 % SOLN nasal spray Place 1 spray into both nostrils as needed for congestion (nose bleeds).  . valACYclovir (VALTREX) 500 MG tablet Take 500 mg by mouth daily as needed (breakouts).      Allergies:   Buprenorphine, Contrast media [iodinated diagnostic agents], Cymbalta [duloxetine hcl], Erythromycin, Hydrocodone, Hydromorphone, Levofloxacin, Metrizamide, Nsaids, Nucynta [tapentadol hcl], Nucynta [tapentadol], Other, Oxycodone, Pentazocine, Septra [bactrim], Sulfa antibiotics, Sulfasalazine, Adhesive [tape], Latex, and Morphine   Social History   Socioeconomic History  . Marital status: Divorced    Spouse name: Not on file  . Number of children: Not on file  . Years of education: Not on file  . Highest education level: 12th grade  Occupational History  . Not on file  Tobacco Use  . Smoking status: Former Smoker    Packs/day: 0.10    Years: 14.00    Pack years: 1.40    Types: Cigarettes  . Smokeless tobacco: Never Used  . Tobacco comment: 03/31/2018 "quit ~ 1980; someday smoker when I did smoke; never addicted"  Vaping Use  . Vaping Use: Never used  Substance and Sexual Activity  . Alcohol use: Never  . Drug use: Yes    Types: Oxycodone, Benzodiazepines     Comment: 03/31/2018 "for chronic neck and back pain", takes Klonopin at times.   . Sexual activity: Not Currently  Other Topics Concern  . Not on file  Social History Narrative  . Not on file   Social Determinants of Health   Financial Resource Strain: Low Risk   . Difficulty of Paying Living Expenses: Not hard at all  Food Insecurity: No Food Insecurity  . Worried About Charity fundraiser in the Last Year: Never true  . Ran Out  of Food in the Last Year: Never true  Transportation Needs: No Transportation Needs  . Lack of Transportation (Medical): No  . Lack of Transportation (Non-Medical): No  Physical Activity: Insufficiently Active  . Days of Exercise per Week: 3 days  . Minutes of Exercise per Session: 20 min  Stress: No Stress Concern Present  . Feeling of Stress : Not at all  Social Connections: Moderately Isolated  . Frequency of Communication with Friends and Family: More than three times a week  . Frequency of Social Gatherings with Friends and Family: Once a week  . Attends Religious Services: 1 to 4 times per year  . Active Member of Clubs or Organizations: No  . Attends Archivist Meetings: Never  . Marital Status: Divorced     Family History: The patient's family history includes Arthritis in her mother; Diabetes in her brother, brother, and father; Heart Problems in her brother; Heart disease in her mother, sister, and sister; Hyperlipidemia in her sister and sister; Stroke in her sister; Uterine cancer in her mother.  ROS:   Please see the history of present illness.    Review of Systems  Constitutional: Positive for malaise/fatigue. Negative for chills and fever.  HENT: Positive for ear pain, hearing loss and sinus pain.   Eyes: Negative for blurred vision.  Respiratory: Positive for shortness of breath.   Cardiovascular: Positive for leg swelling. Negative for chest pain, palpitations, orthopnea, claudication and PND.  Gastrointestinal: Positive  for nausea. Negative for blood in stool.  Genitourinary: Positive for dysuria.  Musculoskeletal: Positive for back pain, joint pain, myalgias and neck pain. Negative for falls.  Skin: Negative for rash.  Neurological: Positive for dizziness. Negative for loss of consciousness.  Psychiatric/Behavioral: Positive for depression.    EKGs/Labs/Other Studies Reviewed:    The following studies were reviewed today: TEE 02-25-2020: IMPRESSIONS  1. Left ventricular ejection fraction, by estimation, is 55 to 60%. The  left ventricle has normal function. The left ventricle has no regional  wall motion abnormalities.  2. Right ventricular systolic function is normal. The right ventricular  size is normal.  3. S/p surgical LAA clipping. No residual connection noted with the LA.  No LA thrombus. No left atrial/left atrial appendage thrombus was  detected.  4. 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 No residual MR. MVA by  direct 3D MPR assessment 2.6 cm2. The mitral valve has been  repaired/replaced. No evidence of mitral valve regurgitation. No evidence  of mitral stenosis. The mean mitral valve  gradient is 4.0 mmHg with average heart rate of 111 bpm. There is a 32 mm  prosthetic annuloplasty ring present in the mitral position. Procedure  Date: 01/17/2020.  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  6. The aortic valve is tricuspid. Aortic valve regurgitation is not  visualized. No aortic stenosis is present.   TTE 02/29/20: IMPRESSIONS  1. There has been no change since the last study on 2020-02-25. Normal  LVEF 60-65% and normal transmitral gradients, mean 4 mmHg.  2. Left ventricular ejection fraction, by estimation, is 60 to 65%. The  left ventricle has normal function. The left ventricle has no regional  wall motion abnormalities. Left ventricular diastolic function could not  be evaluated.  3. Right ventricular systolic function is normal. The right ventricular  size is  normal. There is mildly elevated pulmonary artery systolic  pressure. The estimated right ventricular systolic pressure is 40.1 mmHg.  4. The mitral valve has been repaired/replaced. Trivial  mitral valve  regurgitation. No evidence of mitral stenosis. The mean mitral valve  gradient is 3.0 mmHg. There is a 32 mm Sorin Memo 4D ring annuloplasty  present in the mitral position. Procedure  Date: 01/17/20.  5. Tricuspid valve regurgitation is mild to moderate.  6. The aortic valve is normal in structure. Aortic valve regurgitation is  trivial. No aortic stenosis is present.  7. The inferior vena cava is normal in size with greater than 50%  respiratory variability, suggesting right atrial pressure of 3 mmHg.   RHC/LHC 12/02/19:  Prox RCA lesion is 20% stenosed.  Dist LAD lesion is 20% stenosed.   1. Mild non-obstructive CAD  Recommendation: Continue planning for mitral valve repair.   Most Recent Value  Fick Cardiac Output 5.84 L/min  Fick Cardiac Output Index 3.58 (L/min)/BSA  RA A Wave 2 mmHg  RA V Wave 1 mmHg  RA Mean 0 mmHg  RV Systolic Pressure 29 mmHg  RV Diastolic Pressure -2 mmHg  RV EDP 3 mmHg  PA Systolic Pressure 32 mmHg  PA Diastolic Pressure 10 mmHg  PA Mean 21 mmHg  PW A Wave 22 mmHg  PW V Wave 20 mmHg  PW Mean 13 mmHg  AO Systolic Pressure 161 mmHg  AO Diastolic Pressure 61 mmHg  AO Mean 88 mmHg  LV Systolic Pressure 096 mmHg  LV Diastolic Pressure 1 mmHg  LV EDP 9 mmHg  AOp Systolic Pressure 045 mmHg  AOp Diastolic Pressure 66 mmHg  AOp Mean Pressure 93 mmHg  LVp Systolic Pressure 409 mmHg  LVp Diastolic Pressure 0 mmHg  LVp EDP Pressure 7 mmHg       Recent Labs: 01/27/2020: TSH 2.818 02/03/2020: ALT 20; Magnesium 1.9 02/06/2020: BUN 12; Creatinine, Ser 0.76; Hemoglobin 9.7; Platelets 355; Potassium 4.4; Sodium 139  Recent Lipid Panel    Component Value Date/Time   CHOL 237 (H) 09/23/2017 1420   TRIG 101.0 09/23/2017 1420   HDL 100.90  09/23/2017 1420   CHOLHDL 2 09/23/2017 1420   VLDL 20.2 09/23/2017 1420   LDLCALC 116 (H) 09/23/2017 1420   LDLDIRECT 136.5 06/08/2013 1049     Risk Assessment/Calculations:    CHA2DS2-VASc Score = 3  This indicates a 3.2% annual risk of stroke. The patient's score is based upon: CHF History: 0 HTN History: 0 Diabetes History: 0 Stroke History: 0 Vascular Disease History: 0 Age Score: 2 Gender Score: 1    Physical Exam:    VS:  BP 140/90   Pulse 61   Ht 5\' 6"  (1.676 m)   Wt 120 lb 12.8 oz (54.8 kg)   SpO2 96%   BMI 19.50 kg/m     Wt Readings from Last 3 Encounters:  06/11/20 120 lb 12.8 oz (54.8 kg)  05/07/20 121 lb (54.9 kg)  04/16/20 121 lb (54.9 kg)     GEN: Comfortable, frail HEENT: Normal NECK: No JVD; No carotid bruits CARDIAC: RRR, no murmurs, rubs, gallops. Median sternotomy site c/d/i RESPIRATORY:  Clear to auscultation without rales, wheezing or rhonchi  ABDOMEN: Soft, non-tender, non-distended MUSCULOSKELETAL:  No edema; Has significant scoliosis s/p multiple surgical repairs SKIN: Warm and dry NEUROLOGIC:  Alert and oriented x 3 PSYCHIATRIC:  Normal affect   ASSESSMENT:    1. S/P mitral valve repair   2. Persistent atrial fibrillation ([place])   3. S/P Maze operation for atrial fibrillation   4. Dizziness    PLAN:    In order of problems listed above:  #Severe MR s/p Mitral  Valve Repair: Stable on TTE 02/2020 with LVEF 60-65%, normal RV size and function, RVSP 73mmHg, mean gradient across mitral 28mmHg, trivial MR. Continues to have some shortness of breath with exertion, but overall improved from prior. Likely related to deconditioning with multiple surgeries for her back and recent MVR. No significant CAD on cath. -S/p MV repair 01/2020; trivial MR with mean gradient 9mmHg -Continue lasix 40mg  PO daily -Monitor with serial TTEs every 2-3 years  -Regina need IE prophylaxis for dental procedures  #Afib with RVR CHADs-vasc 3. Managed by Dr.  Curt [name]. -Off amiodarone  -Continue metop tartrate 50mg  BID -Changed from warfarin to xarelto  #Nonobstructive CAD: Pre-operative cath 11/2019 with mild disease (20%) in RCA and LAD. -Patient declined statin therapy for now as she has significant medication reactions and wants to avoiding trying something new.  #Scoliosis: S/p multiple correctional back surgeries. Walks with cane or walker. Feels slightly unsteady on her feet  -Follow-up with primary provider as scheduled -Declined further PT at this time  #Dizziness #Vertigo: Likely vertigo in the setting of an ear infection. Symptoms are non-positional and can come on at any time. -Okay to take meclizine -Follow-up with PCP as scheduled  #Depression: -Continue counseling -Follow-up with PCP as scheduled    Medication Adjustments/Labs and Tests Ordered: Current medicines are reviewed at length with the patient today.  Concerns regarding medicines are outlined above.  No orders of the defined types were placed in this encounter.  No orders of the defined types were placed in this encounter.   Patient Instructions  Medication Instructions:  Your physician recommends that you continue on your current medications as directed. Please refer to the Current Medication list given to you today.  *If you need a refill on your cardiac medications before your next appointment, please call your pharmacy*   Lab Work: none If you have labs (blood work) drawn today and your tests are completely normal, you Regina receive your results only by: [name] MyChart Message (if you have MyChart) OR . A paper copy in the mail If you have any lab test that is abnormal or we need to change your treatment, we Regina call you to review the results.   Testing/Procedures: none   Follow-Up: At [facility], you and your health needs are our priority.  As part of our continuing mission to provide you with exceptional heart care, we have created  designated Provider Care Teams.  These Care Teams include your primary Cardiologist (physician) and Advanced Practice Providers (APPs -  Physician Assistants and Nurse Practitioners) who all work together to provide you with the care you need, when you need it.  We recommend signing up for the patient portal called "MyChart".  Sign up information is provided on this After Visit Summary.  MyChart is used to connect with patients for Virtual Visits (Telemedicine).  Patients are able to view lab/test results, encounter notes, upcoming appointments, etc.  Non-urgent messages can be sent to your provider as well.   To learn more about what you can do with MyChart, go to [email or web address].    Your next appointment:   February 17,2022 at 1:20    The format for your next appointment:   In Person  Provider:   [name], MD   Other Instructions   Dr Johney Frame discussed use of ace wraps for compression with you.  Also can look into lounge doctor for elevation of legs.      Signed, Regina Bergeron, MD  06/11/2020 5:00 PM  [facility]

## 2020-06-11 ENCOUNTER — Encounter: Payer: Self-pay | Admitting: Cardiology

## 2020-06-11 ENCOUNTER — Ambulatory Visit: Payer: Medicare Other | Admitting: Cardiovascular Disease

## 2020-06-11 ENCOUNTER — Ambulatory Visit (INDEPENDENT_AMBULATORY_CARE_PROVIDER_SITE_OTHER): Payer: Medicare Other | Admitting: Cardiology

## 2020-06-11 ENCOUNTER — Other Ambulatory Visit: Payer: Self-pay

## 2020-06-11 VITALS — BP 140/90 | HR 61 | Ht 66.0 in | Wt 120.8 lb

## 2020-06-11 DIAGNOSIS — I4819 Other persistent atrial fibrillation: Secondary | ICD-10-CM

## 2020-06-11 DIAGNOSIS — Z9889 Other specified postprocedural states: Secondary | ICD-10-CM

## 2020-06-11 DIAGNOSIS — Z8679 Personal history of other diseases of the circulatory system: Secondary | ICD-10-CM

## 2020-06-11 DIAGNOSIS — R42 Dizziness and giddiness: Secondary | ICD-10-CM | POA: Diagnosis not present

## 2020-06-11 DIAGNOSIS — R399 Unspecified symptoms and signs involving the genitourinary system: Secondary | ICD-10-CM | POA: Diagnosis not present

## 2020-06-11 NOTE — Patient Instructions (Addendum)
Medication Instructions:  Your physician recommends that you continue on your current medications as directed. Please refer to the Current Medication list given to you today.  *If you need a refill on your cardiac medications before your next appointment, please call your pharmacy*   Lab Work: none If you have labs (blood work) drawn today and your tests are completely normal, you will receive your results only by: [name] MyChart Message (if you have MyChart) OR . A paper copy in the mail If you have any lab test that is abnormal or we need to change your treatment, we will call you to review the results.   Testing/Procedures: none   Follow-Up: At [facility], you and your health needs are our priority.  As part of our continuing mission to provide you with exceptional heart care, we have created designated Provider Care Teams.  These Care Teams include your primary Cardiologist (physician) and Advanced Practice Providers (APPs -  Physician Assistants and Nurse Practitioners) who all work together to provide you with the care you need, when you need it.  We recommend signing up for the patient portal called "MyChart".  Sign up information is provided on this After Visit Summary.  MyChart is used to connect with patients for Virtual Visits (Telemedicine).  Patients are able to view lab/test results, encounter notes, upcoming appointments, etc.  Non-urgent messages can be sent to your provider as well.   To learn more about what you can do with MyChart, go to [email or web address].    Your next appointment:   February 17,2022 at 1:20    The format for your next appointment:   In Person  Provider:   [name], MD   Other Instructions   Dr Johney Frame discussed use of ace wraps for compression with you.  Also can look into lounge doctor for elevation of legs.

## 2020-06-12 ENCOUNTER — Telehealth: Payer: Self-pay | Admitting: Cardiology

## 2020-06-12 NOTE — Telephone Encounter (Signed)
     Pt c/o medication issue:  1. Name of Medication: meclizine  2. How are you currently taking this medication (dosage and times per day)?   3. Are you having a reaction (difficulty breathing--STAT)?   4. What is your medication issue? Pt is calling to follow up if Dr. Johney Frame sent a prescription for her for meclizine

## 2020-06-12 NOTE — Telephone Encounter (Signed)
Spoke with pt and reviewed Dr. Jacolyn Reedy note with her. She is okay to take meclizine and to follow up with her PCP regarding vertigo.  Pt will follow up with PCP for prescription. She had no additional needs.

## 2020-06-14 DIAGNOSIS — L820 Inflamed seborrheic keratosis: Secondary | ICD-10-CM | POA: Diagnosis not present

## 2020-06-14 DIAGNOSIS — L814 Other melanin hyperpigmentation: Secondary | ICD-10-CM | POA: Diagnosis not present

## 2020-06-14 DIAGNOSIS — L821 Other seborrheic keratosis: Secondary | ICD-10-CM | POA: Diagnosis not present

## 2020-06-14 DIAGNOSIS — Z85820 Personal history of malignant melanoma of skin: Secondary | ICD-10-CM | POA: Diagnosis not present

## 2020-06-14 DIAGNOSIS — D692 Other nonthrombocytopenic purpura: Secondary | ICD-10-CM | POA: Diagnosis not present

## 2020-06-15 ENCOUNTER — Other Ambulatory Visit: Payer: Self-pay | Admitting: Cardiology

## 2020-06-19 ENCOUNTER — Telehealth: Payer: Self-pay | Admitting: Cardiology

## 2020-06-19 NOTE — Telephone Encounter (Signed)
Spoke with pt re recent office visit leg edema Reviewed chart and appears pt may use ace wraps for compression and also suggest Lounge Doctor Per pt with discussion was thinking there was something else that may help Reviewed and did not see anything else but wtll forward to Dr Johney Frame for review and recommendations if any ./cy

## 2020-06-19 NOTE — Telephone Encounter (Signed)
Just sent her a message via MyChart with some instructions to help with her swelling other than adjusting her lasix dosing.

## 2020-06-19 NOTE — Telephone Encounter (Signed)
[name] is calling requesting a callback from a nurse in regards to her and Dr. Jacolyn Reedy previous conversation at her last appointment. She states she thought she remembered her discussing something online that gave pointers on how to prevent swelling at night that she advised her to look over and follow, but there is nothing on her AVS regarding it so she is unsure of where to find it. Please advise.

## 2020-07-02 ENCOUNTER — Other Ambulatory Visit: Payer: Self-pay | Admitting: *Deleted

## 2020-07-02 ENCOUNTER — Other Ambulatory Visit: Payer: Self-pay

## 2020-07-02 ENCOUNTER — Encounter: Payer: Self-pay | Admitting: *Deleted
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
## 2020-07-03 ENCOUNTER — Ambulatory Visit (INDEPENDENT_AMBULATORY_CARE_PROVIDER_SITE_OTHER): Payer: Medicare Other

## 2020-07-03 ENCOUNTER — Other Ambulatory Visit: Payer: Self-pay | Admitting: Obstetrics & Gynecology

## 2020-07-03 DIAGNOSIS — Z780 Asymptomatic menopausal state: Secondary | ICD-10-CM

## 2020-07-03 DIAGNOSIS — M810 Age-related osteoporosis without current pathological fracture: Secondary | ICD-10-CM | POA: Diagnosis not present

## 2020-07-05 DIAGNOSIS — L821 Other seborrheic keratosis: Secondary | ICD-10-CM | POA: Diagnosis not present

## 2020-07-05 DIAGNOSIS — L650 Telogen effluvium: Secondary | ICD-10-CM | POA: Diagnosis not present

## 2020-07-05 DIAGNOSIS — L659 Nonscarring hair loss, unspecified: Secondary | ICD-10-CM | POA: Diagnosis not present

## 2020-07-05 DIAGNOSIS — Z79899 Other long term (current) drug therapy: Secondary | ICD-10-CM | POA: Diagnosis not present

## 2020-07-05 DIAGNOSIS — Z85820 Personal history of malignant melanoma of skin: Secondary | ICD-10-CM | POA: Diagnosis not present

## 2020-07-05 DIAGNOSIS — D1723 Benign lipomatous neoplasm of skin and subcutaneous tissue of right leg: Secondary | ICD-10-CM | POA: Diagnosis not present

## 2020-07-10 ENCOUNTER — Telehealth: Payer: Self-pay | Admitting: Family Medicine

## 2020-07-10 ENCOUNTER — Other Ambulatory Visit: Payer: Self-pay

## 2020-07-10 NOTE — Telephone Encounter (Signed)
Patient scheduled for 07/11/20.

## 2020-07-10 NOTE — Telephone Encounter (Signed)
Patient is extremely upset - her hair is falling out due to low thyroid.  Dr. Nicholas Lose the dermatologist called her and told her that her thyroid count is abnormally low.  He gave her the results of her tests and told her to get in touch with Dr. Beverely Low. - Patient states that she needs to speak with someone today.

## 2020-07-10 NOTE — Telephone Encounter (Signed)
Please advise 

## 2020-07-10 NOTE — Telephone Encounter (Signed)
Pt needs to schedule an appt.  She has never had issues w/ her thyroid before so this is a new problem that needs to be addressed.  She can schedule an appt at her convenience

## 2020-07-11 ENCOUNTER — Other Ambulatory Visit: Payer: Self-pay

## 2020-07-11 ENCOUNTER — Ambulatory Visit (INDEPENDENT_AMBULATORY_CARE_PROVIDER_SITE_OTHER): Payer: Medicare Other | Admitting: Family Medicine

## 2020-07-11 ENCOUNTER — Encounter: Payer: Self-pay | Admitting: Family Medicine

## 2020-07-11 ENCOUNTER — Telehealth: Payer: Self-pay | Admitting: Cardiology

## 2020-07-11 VITALS — BP 122/68 | HR 67 | Temp 97.4°F | Resp 16 | Ht 66.0 in | Wt 124.8 lb

## 2020-07-11 DIAGNOSIS — L659 Nonscarring hair loss, unspecified: Secondary | ICD-10-CM

## 2020-07-11 DIAGNOSIS — R42 Dizziness and giddiness: Secondary | ICD-10-CM

## 2020-07-11 DIAGNOSIS — R7989 Other specified abnormal findings of blood chemistry: Secondary | ICD-10-CM | POA: Diagnosis not present

## 2020-07-11 DIAGNOSIS — R350 Frequency of micturition: Secondary | ICD-10-CM

## 2020-07-11 DIAGNOSIS — R110 Nausea: Secondary | ICD-10-CM

## 2020-07-11 LAB — HEPATIC FUNCTION PANEL
ALT: 24 U/L (ref 0–35)
AST: 30 U/L (ref 0–37)
Albumin: 4.3 g/dL (ref 3.5–5.2)
Alkaline Phosphatase: 61 U/L (ref 39–117)
Bilirubin, Direct: 0.1 mg/dL (ref 0.0–0.3)
Total Bilirubin: 0.4 mg/dL (ref 0.2–1.2)
Total Protein: 7.4 g/dL (ref 6.0–8.3)

## 2020-07-11 LAB — CBC WITH DIFFERENTIAL/PLATELET
Basophils Absolute: 0 10*3/uL (ref 0.0–0.1)
Basophils Relative: 0.6 % (ref 0.0–3.0)
Eosinophils Absolute: 0.5 10*3/uL (ref 0.0–0.7)
Eosinophils Relative: 8.2 % — ABNORMAL HIGH (ref 0.0–5.0)
HCT: 36.1 % (ref 36.0–46.0)
Hemoglobin: 11.7 g/dL — ABNORMAL LOW (ref 12.0–15.0)
Lymphocytes Relative: 25.5 % (ref 12.0–46.0)
Lymphs Abs: 1.5 10*3/uL (ref 0.7–4.0)
MCHC: 32.4 g/dL (ref 30.0–36.0)
MCV: 85.5 fl (ref 78.0–100.0)
Monocytes Absolute: 0.6 10*3/uL (ref 0.1–1.0)
Monocytes Relative: 10.2 % (ref 3.0–12.0)
Neutro Abs: 3.2 10*3/uL (ref 1.4–7.7)
Neutrophils Relative %: 55.5 % (ref 43.0–77.0)
Platelets: 192 10*3/uL (ref 150.0–400.0)
RBC: 4.22 Mil/uL (ref 3.87–5.11)
RDW: 18.5 % — ABNORMAL HIGH (ref 11.5–15.5)
WBC: 5.8 10*3/uL (ref 4.0–10.5)

## 2020-07-11 LAB — POCT URINALYSIS DIPSTICK OB
Bilirubin, UA: NEGATIVE
Blood, UA: NEGATIVE
Glucose, UA: NEGATIVE
Ketones, UA: NEGATIVE
Nitrite, UA: POSITIVE
Spec Grav, UA: >=1.03 — AB (ref 1.010–1.025)
Urobilinogen, UA: 0.2 E.U./dL
pH, UA: 6 (ref 5.0–8.0)

## 2020-07-11 LAB — BASIC METABOLIC PANEL
BUN: 23 mg/dL (ref 6–23)
CO2: 37 mEq/L — ABNORMAL HIGH (ref 19–32)
Calcium: 9.7 mg/dL (ref 8.4–10.5)
Chloride: 101 mEq/L (ref 96–112)
Creatinine, Ser: 0.83 mg/dL (ref 0.40–1.20)
GFR: 68.71 mL/min (ref >60.00)
Glucose, Bld: 81 mg/dL (ref 70–99)
Potassium: 4.9 mEq/L (ref 3.5–5.1)
Sodium: 141 mEq/L (ref 135–145)

## 2020-07-11 LAB — TSH: TSH: 47.56 u[IU]/mL — ABNORMAL HIGH (ref 0.35–4.50)

## 2020-07-11 LAB — T4, FREE: Free T4: 0.89 ng/dL (ref 0.60–1.60)

## 2020-07-11 LAB — T3, FREE: T3, Free: 4.7 pg/mL — ABNORMAL HIGH (ref 2.3–4.2)

## 2020-07-11 MED ORDER — LEVOTHYROXINE SODIUM 50 MCG PO TABS: 50.0000 ug | ORAL_TABLET | Freq: Every day | ORAL | 3 refills | Status: DC

## 2020-07-11 MED ORDER — MECLIZINE HCL 25 MG PO TABS: 25.0000 mg | ORAL_TABLET | Freq: Three times a day (TID) | ORAL | 0 refills | Status: DC | PRN

## 2020-07-11 MED ORDER — CEPHALEXIN 500 MG PO CAPS
500.0000 mg | ORAL_CAPSULE | Freq: Three times a day (TID) | ORAL | 0 refills | Status: AC
Start: 2020-07-11 — End: 2020-07-16

## 2020-07-11 NOTE — Patient Instructions (Signed)
Follow up in 1 month to recheck thyroid We'll notify you of your lab results and make any changes if needed START the Levothyroxine once daily.  Take on empty stomach- 30 minutes prior to food or other medications TAKE the Cephalexin 3x/day for the UTI Drink LOTS of fluids Take the Meclizine as needed for dizziness Make sure you tell the ear doctor about the dizziness Call with any questions or concerns Hang in there!!!

## 2020-07-11 NOTE — Telephone Encounter (Signed)
Called pt and left VM for [name] to call back. Typically we only give samples for new starts and patients apply for patient assistance.  I will see if its an issue that she cannot afford the $525 all at once. If it is, I think its reasonable to give 1 month of samples if we have them.

## 2020-07-11 NOTE — Telephone Encounter (Signed)
Pt is aware samples are up front. 

## 2020-07-11 NOTE — Telephone Encounter (Signed)
Patient is calling to follow up. She states she hung up the phone so quickly because she was in the grocery store and she was unable to continue taking while in line. She is requesting a call back to discuss further.

## 2020-07-11 NOTE — Telephone Encounter (Signed)
Leaving pt 2 bottles of Xarelto 20 mg tablets and pt assistance information for pt to apply and pick up at the front desk. Lot# [id number]  eXP: 11/23. FYI

## 2020-07-11 NOTE — Telephone Encounter (Signed)
     Patient calling the office for samples of medication:   1.  What medication and dosage are you requesting samples for? rivaroxaban (XARELTO) 20 MG TABS tablet  2.  Are you currently out of this medication? Almost   Pt said her deductible increased for xarelto $525, she asked for samples until her deductible goes down and can afford getting xarelto

## 2020-07-11 NOTE — Progress Notes (Signed)
Subjective:    Patient ID: [name], female    DOB: 18-Feb-1946, 75 y.o.   MRN: [id number]  HPI Abnormal thyroid- pt saw Derm for recent hair loss and TSH was 61.45.  Last TSH here was 2.818 (01/27/20)  Pt states she has lost 'half of it' in regards to her hair- notes it is dry, brittle, breaking easily.  Cried all weekend about her hair loss.  'I feel terrible'- 'every day I wake up and pray I get through the day.  I really think I'm dying'.  + nausea.  + dizziness- pt reports it can occur w/ standing, with turning her head, 'anything can bring it on and it can happen at any time'.  Pt describes the dizziness as feeling 'seasick'.  Pt reports she lies down and takes a nausea medication and sxs resolve.  Has appt w/ 'ear doctor' next week.  Possible UTI- pt reports urinary frequency.  Pt has a urologist (Dr Logan Bores).  Was treated w/ abx x10-12 days starting prior to Christmas.  Pt is not able to remember what antibiotic.  Notes she also has foul odor to urine.  Denies burning w/ urination.   Review of Systems For ROS see HPI   This visit occurred during the SARS-CoV-2 public health emergency.  Safety protocols were in place, including screening questions prior to the visit, additional usage of staff PPE, and extensive cleaning of exam room while observing appropriate contact time as indicated for disinfecting solutions.       Objective:   Physical Exam Vitals reviewed.  Constitutional:      General: She is not in acute distress.    Comments: Frail, elderly woman  HENT:     Head: Normocephalic and atraumatic.     Comments: Thinning hair Eyes:     Extraocular Movements: Extraocular movements intact.     Pupils: Pupils are equal, round, and reactive to light.  Cardiovascular:     Rate and Rhythm: Normal rate and regular rhythm.     Pulses: Normal pulses.  Pulmonary:     Effort: Pulmonary effort is normal. No respiratory distress.     Breath sounds: Normal breath sounds. No wheezing.   Abdominal:     General: Abdomen is flat.     Palpations: Abdomen is soft.     Tenderness: There is no abdominal tenderness. There is no right CVA tenderness, left CVA tenderness, guarding or rebound.  Musculoskeletal:     Cervical back: Neck supple.     Right lower leg: No edema.     Left lower leg: No edema.  Lymphadenopathy:     Cervical: No cervical adenopathy.  Skin:    General: Skin is warm and dry.  Neurological:     Mental Status: She is alert and oriented to person, place, and time.  Psychiatric:     Comments: Very anxious           Assessment & Plan:  Abnormal TSH- new.  TSH was very high when checked at dermatology.  This is new for pt as it was normal at last check here.  Start Levothyroxine and monitor closely to get this back in range.  Pt expressed understanding and is in agreement w/ plan.   Hair loss- deteriorated.  I suspect the recent worsening of her hair loss is thyroid related.  However we had a long discussion that she has complained of hair loss for years- predating her thyroid issues.  She wants to ensure that by  treating her thyroid we will cure her hair loss.  I told her it should improve but it will not correct all of it since this predated her thyroid.  She finally understood what I was saying.  Will monitor for improvement.  Nausea/dizziness- new.  Pt reports she has appt upcoming w/ ENT to assess her dizziness, which she describes as feeling 'sea sick'.  Her symptoms are likely multifactorial- vertigo, orthostatic hypotension.  Encouraged her to increase her water intake, change positions slowly to allow her body time to adjust, and use the Meclizine as needed.  Pt expressed understanding and is in agreement w/ plan.   Urinary frequency- UA is suspicious for infection.  Start Keflex and await culture results.

## 2020-07-11 NOTE — Telephone Encounter (Signed)
I called the patient and explained that she will have to pay her deducible one way or the other. Getting samples is just avoiding the eneviable. She states she knows she will have to pay it, but she will pay it down with other medications. She has done this in the past. She said if you wont give me samples its fine and hung up.

## 2020-07-13 ENCOUNTER — Telehealth: Payer: Self-pay | Admitting: Family Medicine

## 2020-07-13 LAB — URINE CULTURE
MICRO NUMBER:: 11385335
SPECIMEN QUALITY:: ADEQUATE

## 2020-07-13 NOTE — Telephone Encounter (Signed)
Pt called in stating that she is still having dizziness she wanted to know if Dr. Birdie [name] thought it was something she should be concerned about and be seen before next Wednesday when she goes to see her ENT?  Please advise

## 2020-07-13 NOTE — Telephone Encounter (Signed)
She has had the dizziness for awhile and given the lack of appointments across primary care right now, I think it would be difficult for her to be seen prior to Wednesday.  She needs to drink lots of fluids, change positions slowly to allow herself time to adjust.  If anything changes or worsens she can let us know, but at this time, Wednesday is great

## 2020-07-13 NOTE — Telephone Encounter (Signed)
Please advise 

## 2020-07-13 NOTE — Telephone Encounter (Signed)
Called patient with PCP recommendations. Patient voiced understanding. Patient also asked about urine culture results. Patient informed that we have not received them, but when we do, we will notify her.

## 2020-07-18 DIAGNOSIS — R040 Epistaxis: Secondary | ICD-10-CM | POA: Diagnosis not present

## 2020-07-18 DIAGNOSIS — H6123 Impacted cerumen, bilateral: Secondary | ICD-10-CM | POA: Diagnosis not present

## 2020-07-18 DIAGNOSIS — J300 Vasomotor rhinitis: Secondary | ICD-10-CM | POA: Diagnosis not present

## 2020-07-18 DIAGNOSIS — R42 Dizziness and giddiness: Secondary | ICD-10-CM | POA: Diagnosis not present

## 2020-07-18 DIAGNOSIS — H6040 Cholesteatoma of external ear, unspecified ear: Secondary | ICD-10-CM | POA: Diagnosis not present

## 2020-07-19 ENCOUNTER — Telehealth: Payer: Self-pay | Admitting: Family Medicine

## 2020-07-19 ENCOUNTER — Telehealth: Payer: Self-pay | Admitting: Cardiology

## 2020-07-19 NOTE — Telephone Encounter (Signed)
Patient would like to discuss her dizziness.  Please advise.

## 2020-07-19 NOTE — Telephone Encounter (Signed)
STAT if patient feels like he/she is going to faint   1) Are you dizzy now? No- comes and goes-  Just a mild dizziness- started a few ago  2) Do you feel faint or have you passed out? no  3) Do you have any other symptoms?  No- a little swelling in her ankles and no energy  4) Have you checked your HR and BP (record if available)?  no

## 2020-07-19 NOTE — Telephone Encounter (Signed)
Pt called to report that she has been having dizziness on and off for several weeks... no dyspnea, no palpitations, no congestion/ cough.   Pt denies chest pain and reports only mild edema of her lower extremities.   Pt has h/o ear wax build up and saw her ENT yesterday and he denies she had any problems with her ears. He also noted he did not think it was vertigo.   Pt says it happens at any time and she can be sitting or standing but never when she is laying down.   She saw her PCP/ Dr. [name] recently and was being treated for severe hypothyroidism and UTI. She will be following up with her again in a few weeks.   Pt BP has been consistent with 103/59. She has been eating and drinking as she normally does.   I advised her to continue to hydrate herself well, I talked to her about rising slow from sitting to standing to avoid injury. I will forward to Dr. Johney Frame for her review. Pt to call back if anything changes or worsens.

## 2020-07-23 ENCOUNTER — Telehealth: Payer: Self-pay

## 2020-07-23 NOTE — Telephone Encounter (Signed)
Patient called and stated that she went to the ENT and there were no findings but she she is still having the same issue of dizziness and nausea. Patient stated that she is taking her meds as prescribed but wanted to know your thoughts. She has an appt scheduled with you in Feb for her Thyroid. Please advise

## 2020-07-23 NOTE — Telephone Encounter (Signed)
Called and left vm to return call to office in regards to concerns of dizziness.

## 2020-07-23 NOTE — Telephone Encounter (Signed)
Called and left a message to return call to office to in regards to concerns of dizziness.

## 2020-07-23 NOTE — Telephone Encounter (Signed)
Since ENT doesn't feel there is an issue, I would like to normalize her thyroid prior to referring to neurology.  Hopefully once her thyroid improves, so will the dizziness.

## 2020-07-24 ENCOUNTER — Other Ambulatory Visit: Payer: Self-pay | Admitting: Cardiology

## 2020-07-25 NOTE — Telephone Encounter (Signed)
Patient called the office back yesterday and advised her dizziness is improving. She is taking Levothyroxine 50 mcg daily for the last 2 weeks. I advised patient the medication will take up to 6-8 weeks to see affects in lab tests and symptoms. She has a follow up for recheck in Feb. Patient felt better with reassurance given to patient. Advised patient of PCP recommendations.

## 2020-07-26 ENCOUNTER — Other Ambulatory Visit: Payer: Self-pay | Admitting: Cardiology

## 2020-07-26 ENCOUNTER — Telehealth: Payer: Self-pay | Admitting: Cardiology

## 2020-07-26 MED ORDER — CEPHALEXIN 500 MG PO CAPS: 2000.0000 mg | ORAL_CAPSULE | Freq: Once | ORAL | 6 refills | Status: DC

## 2020-07-26 NOTE — Telephone Encounter (Signed)
Patient states during past appointments, she was advised to contact our office for refills of cephalexin, to be taken prior to having dental work. She states she will be having dental work again soon and she is requesting a refill.   *STAT* If patient is at the pharmacy, call can be transferred to refill team.   1. Which medications need to be refilled? (please list name of each medication and dose if known) cephalexin  2. Which pharmacy/location (including street and city if local pharmacy) is medication to be sent to? [place], [place]  3. Do they need a 30 day or 90 day supply? Temporary supply for dental work

## 2020-07-26 NOTE — Telephone Encounter (Signed)
Pt has not called back here regarding her s/s however she did call Dr Virgil Benedict office as noted in documentation below: July 25, 2020  Leonidas Romberg, CMA   2:18 PM Note Patient called the office back yesterday and advised her dizziness is improving. She is taking Levothyroxine 50 mcg daily for the last 2 weeks. I advised patient the medication will take up to 6-8 weeks to see affects in lab tests and symptoms. She has a follow up for recheck in Feb. Patient felt better with reassurance given to patient. Advised patient of PCP recommendations.     Pt reported her dizziness is improving.  She will keep her upcoming appt with Dr Johney Frame as scheduled.  She will c/b before if necessary.

## 2020-07-26 NOTE — Telephone Encounter (Signed)
Pt is requesting a refill on cephalexin for dental procedure. Would Dr. Johney Frame like to sent in a Rx for this medication? Please address

## 2020-07-26 NOTE — Telephone Encounter (Signed)
Just sent

## 2020-07-27 ENCOUNTER — Telehealth: Payer: Self-pay

## 2020-07-27 ENCOUNTER — Telehealth: Payer: Self-pay | Admitting: Family Medicine

## 2020-07-27 MED ORDER — CEPHALEXIN 500 MG PO CAPS: ORAL_CAPSULE | ORAL | 8 refills | Status: DC

## 2020-07-27 NOTE — Addendum Note (Signed)
Addended by: Harland German A on: 07/27/2020 11:50 AM   Modules accepted: Orders

## 2020-07-27 NOTE — Telephone Encounter (Signed)
Biotin can interfere w/ thyroid hormone.  She needs to hold the Biotin until we get her thyroid straightened out

## 2020-07-27 NOTE — Telephone Encounter (Signed)
Pt called in asking if she should continue to take the Biotin OTC once daily vitamin for hair, nails, and skin along with taking the Levothyroxine.  Please advise and pt can be reached at the home #

## 2020-07-27 NOTE — Telephone Encounter (Signed)
Called and spoke with patient about vitamins. Per Dr. [name], hold the biotin until thyroid is normal. Patient understood.

## 2020-07-27 NOTE — Telephone Encounter (Signed)
Spoke with patient. Patient understood about holding the biotin until thyroid is normal.

## 2020-08-02 ENCOUNTER — Other Ambulatory Visit: Payer: Self-pay

## 2020-08-02 ENCOUNTER — Emergency Department (INDEPENDENT_AMBULATORY_CARE_PROVIDER_SITE_OTHER)
Admission: EM | Admit: 2020-08-02 | Discharge: 2020-08-02 | Disposition: A | Discharge status: Home / Self Care | Payer: Medicare Other | Source: Home / Self Care

## 2020-08-02 DIAGNOSIS — S81811A Laceration without foreign body, right lower leg, initial encounter: Secondary | ICD-10-CM | POA: Diagnosis not present

## 2020-08-02 DIAGNOSIS — Z23 Encounter for immunization: Secondary | ICD-10-CM | POA: Diagnosis not present

## 2020-08-02 DIAGNOSIS — W010XXA Fall on same level from slipping, tripping and stumbling without subsequent striking against object, initial encounter: Secondary | ICD-10-CM

## 2020-08-02 DIAGNOSIS — W2203XA Walked into furniture, initial encounter: Secondary | ICD-10-CM

## 2020-08-02 MED ORDER — LIDOCAINE-EPINEPHRINE-TETRACAINE (LET) TOPICAL GEL
3.0000 mL | Freq: Once | TOPICAL | Status: AC
  Administered 2020-08-02: 3 mL via TOPICAL

## 2020-08-02 MED ORDER — TETANUS-DIPHTH-ACELL PERTUSSIS 5-2.5-18.5 LF-MCG/0.5 IM SUSY
0.5000 mL | PREFILLED_SYRINGE | Freq: Once | INTRAMUSCULAR | Status: AC
  Administered 2020-08-02: 0.5 mL via INTRAMUSCULAR

## 2020-08-02 NOTE — ED Provider Notes (Signed)
[name]    CSN: [id number] Arrival date & time: 08/02/20  1219      History   Chief Complaint Chief Complaint  Patient presents with  . Laceration    RT leg    HPI [name] is a 75 y.o. female.   HPI  [name] is a 75 y.o. female presenting to UC with c/o Right lower leg laceration that occurred around 11:30AM this morning. Pt was in her laundry room and got a plastic stool caught on a sheet, the stool came up and hit her in the leg, causing in a large laceration. Denies hitting her head or other injuries from the incident. Pt is on Xarelto for afib. Pt called EMS who bandage the wound to help stop the bleeding.  Dtap 2011  Past Medical History:  Diagnosis Date  . Anxiety   . Arthritis    "maybe in my back" (03/31/2018)  . Benign paroxysmal positional vertigo 06/08/2013  . Complication of anesthesia   . Fracture of multiple ribs 2015   "don't know from what; dx'd when I in hospital for 1st back OR" (03/31/2018)  . GERD (gastroesophageal reflux disease)   . Hair loss 04/12/2012  . Herpes   . History of blood transfusion    "twice; related to back OR" (03/31/2018)  . History of kidney stones   . Interstitial cystitis 11/06/2011  . 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 Mitral regurgitation   . Osteopenia 02/18/2012  . Osteoporosis   . PAF (paroxysmal atrial fibrillation) ([place]) 2012  . Peripheral neuropathy 11/06/2011  . PMDD (premenstrual dysphoric disorder)   . PONV (postoperative nausea and vomiting)    nausea, vomiting, hives and dizziness   . 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/2020   Complete bilateral atrial lesion set using cryothermy and bipolar radiofrequency ablation with clipping of LA appendage via right mini-thoracotomy approach  . 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 Seasonal allergies   . Vaginal  delivery    ONE NSVD  . 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 Atrial fibrillation ([place]) 02/09/2020  . Long term (current) use of anticoagulants 02/09/2020  . Acute blood loss anemia 02/09/2020  . Debility 02/02/2020  . 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/2020  . Severe mitral regurgitation   . 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 Ankle swelling, left 10/20/2019  . Ankle swelling, right 10/20/2019  . Secondary hypercoagulable state ([place]) 08/19/2019  . Left inguinal hernia 03/11/2019  . Visit for monitoring Tikosyn therapy 03/29/2018  . Osteoporosis 09/23/2017  . Fatigue 02/18/2017  . Physical exam 04/21/2016  . Vertigo 11/16/2015  . B12 deficiency 10/01/2015  . Hearing loss due to cerumen impaction 08/23/2015  . Foot pain, right 05/23/2015  . Hyperlipidemia 04/19/2015  . Protein-calorie malnutrition ([place]) 10/19/2014  . Fracture of multiple ribs 08/07/2014  . Tachycardia   . Constipation 07/14/2014  . Vitamin B12 deficiency anemia, unspecified 06/19/2014  . Hypokalemia 06/19/2014  . Unspecified atrial fibrillation ([place]) 06/19/2014  . Other chronic pain 06/19/2014  . Polyneuropathy, unspecified 06/19/2014  . Zoster without complications [id number]  . Vitreous degeneration, bilateral 06/19/2014  . Nutritional deficiency, unspecified 06/19/2014  . Nocturia 06/19/2014  . Other symbolic dysfunctions [id number]  . Unsteadiness on  feet 06/19/2014  . Unspecified osteoarthritis, unspecified site 06/19/2014  . Muscle weakness (generalized) 06/19/2014  . Major depressive disorder, single episode, unspecified 06/19/2014  . Frequency of micturition 06/19/2014  . Encounter for orthopedic aftercare following scoliosis surgery 06/19/2014  . Cardiac arrhythmia, unspecified 06/19/2014  . Age-related nuclear cataract, unspecified eye 06/19/2014  . Scoliosis, unspecified 06/19/2014  .  Lumbar back pain 05/19/2012  . Anxiety 02/18/2012  . Ureteric stone 02/18/2012  . Hereditary and idiopathic peripheral neuropathy 11/06/2011  . S/P cervical spinal fusion 11/06/2011  . Gastro-esophageal reflux disease without esophagitis 11/06/2011  . 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 11/06/2011  . Melanoma of skin ([facility]) 11/06/2011  . Personal history of other diseases of the circulatory system 11/06/2011  . Allergic rhinitis, unspecified 04/25/2010  . 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 Anxiety disorder, unspecified 03/04/2010  . Other malaise and fatigue 02/05/2010  . Vitamin D deficiency 01/28/2010  . Neoplasm of uncertain behavior of skin 01/25/2010  . Genital herpes 11/01/2009  . Herpes simplex virus (HSV) infection 11/01/2009  . Herpetic vulvovaginitis 10/23/2009  . Kidney stone 08/09/2009  . 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 Brachial neuritis or radiculitis 07/23/2009    Past Surgical History:  Procedure Laterality Date  . ANTERIOR CERVICAL DECOMP/DISCECTOMY FUSION  ~ 2003  . BACK SURGERY    . BREAST SURGERY     BREAST BIOPSY--RIGHT BENIGN  . BUNIONECTOMY Bilateral   . CARDIOVERSION N/A 01/26/2020   Procedure: CARDIOVERSION;  Surgeon: Sande Rives'Neal, Alta Thomas, MD;  [facility];  Service: Cardiovascular;  Laterality: N/A;  . COSMETIC SURGERY  2016   "back of my neck; related to earlier fusion"  . CYSTOSCOPY W/ STONE MANIPULATION  "several times"  . DILATION AND CURETTAGE OF UTERUS    . FOREHEAD RECONSTRUCTION Right    "removed bone protruding out of my forehead"  . HARDWARE REMOVAL  2016   "related to neck OR"  . INCONTINENCE SURGERY    . IR THORACENTESIS ASP PLEURAL SPACE W/IMG GUIDE  02/16/2020  . MINIMALLY INVASIVE MAZE PROCEDURE N/A 01/17/2020   Procedure: MINIMALLY INVASIVE MAZE PROCEDURE;  Surgeon: Purcell Nailswen, Clarence H, MD;  [facility];  Service: Open Heart Surgery;  Laterality: N/A;  . MITRAL VALVE REPAIR Right  01/17/2020   Procedure: MINIMALLY INVASIVE MITRAL VALVE REPAIR (MVR) USING MEMO 4D 32MM;  Surgeon: Purcell Nailswen, Clarence H, MD;  [facility];  Service: Open Heart Surgery;  Laterality: Right;  . POSTERIOR CERVICAL FUSION/FORAMINOTOMY  ~ 2008; 2015  . RIGHT/LEFT HEART CATH AND CORONARY ANGIOGRAPHY N/A 12/02/2019   Procedure: RIGHT/LEFT HEART CATH AND CORONARY ANGIOGRAPHY;  Surgeon: Kathleene HazelMcAlhany, Christopher D, MD;  Location: [facility] INVASIVE CV LAB;  Service: Cardiovascular;  Laterality: N/A;  . SHOULDER ARTHROSCOPY W/ ROTATOR CUFF REPAIR Right 2012  . SPINAL FUSION  06/2014 - 2018 X ?7   "scoliosis; my entire back"  . TEE WITHOUT CARDIOVERSION N/A 11/21/2019   Procedure: TRANSESOPHAGEAL ECHOCARDIOGRAM (TEE);  Surgeon: Wendall StadeNishan, Peter C, MD;  [facility];  Service: Cardiovascular;  Laterality: N/A;  . TEE WITHOUT CARDIOVERSION N/A 01/17/2020   Procedure: TRANSESOPHAGEAL ECHOCARDIOGRAM (TEE);  Surgeon: Purcell Nailswen, Clarence H, MD;  [facility];  Service: Open Heart Surgery;  Laterality: N/A;  . TEE WITHOUT CARDIOVERSION N/A 01/26/2020   Procedure: TRANSESOPHAGEAL ECHOCARDIOGRAM (TEE);  Surgeon: Sande Rives'Neal,  Thomas, MD;  [facility];  Service: Cardiovascular;  Laterality: N/A;  . TUBAL LIGATION    . VAGINAL HYSTERECTOMY     TVH    OB History  Gravida  1   Para  1   Term  1   Preterm      AB      Living  1     SAB      IAB      Ectopic      Multiple      Live Births  1            Home Medications    Prior to Admission medications   Medication Sig Start Date End Date Taking? Authorizing Provider  acetaminophen (TYLENOL) 500 MG tablet Take 500 mg by mouth every 6 (six) hours as needed for moderate pain.     [provider]  b complex vitamins tablet Take 1 tablet by mouth daily.    [provider]  Calcium Carbonate Antacid (TUMS CHEWY BITES PO) Take 1 tablet by mouth daily as needed (reflux).     [provider]  Calcium  Citrate-Vitamin D (CALCIUM + D PO) Take 1 tablet by mouth daily. Patient not taking: Reported on 07/11/2020    [provider]  Carboxymethylcellul-Glycerin (LUBRICATING EYE DROPS OP) Place 1 drop into both eyes daily as needed (dry eyes).    [provider]  cephALEXin (KEFLEX) 500 MG capsule Take 4 pills (2,000 mg) 1 hour prior to all dental visits. 07/27/20   Freada Bergeron, MD  clonazePAM (KLONOPIN) 0.5 MG tablet Take 1 tablet (0.5 mg total) by mouth 2 (two) times daily as needed for anxiety. 03/16/20   [name], MD  cyclobenzaprine (FLEXERIL) 10 MG tablet Take 10 mg by mouth at bedtime as needed for muscle spasms.  12/10/17   [provider]  denosumab (PROLIA) 60 MG/ML SOSY injection Inject 60 mg into the skin every 6 (six) months.    [provider]  furosemide (LASIX) 40 MG tablet Take 1 tablet (40 mg total) by mouth daily. 03/09/20   Camnitz, Will Hassell Done, MD  lactulose (CHRONULAC) 10 GM/15ML solution Take 20 g by mouth daily as needed for moderate constipation (constipation.).     [provider]  levothyroxine (SYNTHROID) 50 MCG tablet Take 1 tablet (50 mcg total) by mouth daily. 07/11/20   [name], MD  meclizine (ANTIVERT) 25 MG tablet Take 1 tablet (25 mg total) by mouth 3 (three) times daily as needed for dizziness. 07/11/20   [name], MD  metoprolol tartrate (LOPRESSOR) 50 MG tablet Take 1 tablet by mouth twice daily 06/15/20   Camnitz, Ocie Doyne, MD  Nutritional Supplements (,FEEDING SUPPLEMENT, PROSOURCE PLUS) liquid Take 30 mLs by mouth 2 (two) times daily between meals. 02/08/20   Love, Ivan Anchors, PA-C  nystatin (MYCOSTATIN/NYSTOP) powder Apply topically 2 (two) times daily as needed (skin irritation (under breasts)). 02/08/20   Love, Ivan Anchors, PA-C  ondansetron (ZOFRAN) 4 MG tablet Take 4 mg by mouth every 8 (eight) hours as needed. 05/22/20   [provider]  oxyCODONE (ROXICODONE) 5 MG/5ML solution  Take 4 mLs (4 mg total) by mouth every 4 (four) hours as needed for moderate pain. 02/08/20   Love, Ivan Anchors, PA-C  polyethylene glycol (MIRALAX / GLYCOLAX) packet Take 17 g by mouth daily as needed for mild constipation.    [provider]  Potassium 99 MG TABS 1 tablet Patient not taking: Reported on 07/11/2020    [provider]  rivaroxaban (XARELTO) 20 MG TABS tablet Take 1 tablet (20 mg total) by mouth daily with supper. 04/04/20   Camnitz,  Will Hassell Done, MD  sodium chloride (OCEAN) 0.65 % SOLN nasal spray Place 1 spray into both nostrils as needed for congestion (nose bleeds).    [provider]  valACYclovir (VALTREX) 500 MG tablet Take 500 mg by mouth daily as needed (breakouts).  07/15/19   [provider]    Family History Family History  Problem Relation Age of Onset  . Diabetes Father   . Hyperlipidemia Sister   . Heart disease Sister   . Stroke Sister   . Diabetes Brother   . Hyperlipidemia Sister   . Heart disease Sister   . Arthritis Mother   . Heart disease Mother   . Uterine cancer Mother   . Diabetes Brother   . Heart Problems Brother     Social History Social History   Tobacco Use  . Smoking status: Former Smoker    Packs/day: 0.10    Years: 14.00    Pack years: 1.40    Types: Cigarettes  . Smokeless tobacco: Never Used  . Tobacco comment: 03/31/2018 "quit ~ 1980; someday smoker when I did smoke; never addicted"  Vaping Use  . Vaping Use: Never used  Substance Use Topics  . Alcohol use: Never  . Drug use: Yes    Types: Oxycodone, Benzodiazepines    Comment: 03/31/2018 "for chronic neck and back pain", takes Klonopin at times.      Allergies   Buprenorphine, Cymbalta [duloxetine hcl], Erythromycin, Hydrocodone, Hydromorphone, Iodinated diagnostic agents, Iodine, Levofloxacin, Metrizamide, Nsaids, Nucynta [tapentadol hcl], Nucynta [tapentadol], Other, Oxycodone, Pentazocine, Septra [bactrim], Sulfasalazine, Adhesive [tape],  Latex, Morphine, and Sulfa antibiotics   Review of Systems Review of Systems  Skin: Positive for color change and wound.  Neurological: Negative for dizziness, light-headedness and headaches.     Physical Exam Triage Vital Signs ED Triage Vitals  Enc Vitals Group     BP 08/02/20 1235 124/77     Pulse Rate 08/02/20 1235 70     Resp 08/02/20 1235 18     Temp 08/02/20 1235 98.2 F (36.8 C)     Temp Source 08/02/20 1235 Oral     SpO2 08/02/20 1235 97 %     Weight --      Height --      Head Circumference --      Peak Flow --      Pain Score 08/02/20 1236 9     Pain Loc --      Pain Edu? --      Excl. in [place]? --    No data found.  Updated Vital Signs BP 124/77 (BP Location: Right Arm)   Pulse 70   Temp 98.2 F (36.8 C) (Oral)   Resp 18   SpO2 97%   Visual Acuity Right Eye Distance:   Left Eye Distance:   Bilateral Distance:    Right Eye Near:   Left Eye Near:    Bilateral Near:     Physical Exam Vitals and nursing note reviewed.  Constitutional:      Appearance: Normal appearance. She is well-developed and well-nourished.  HENT:     Head: Normocephalic and atraumatic.  Eyes:     Extraocular Movements: EOM normal.  Cardiovascular:     Rate and Rhythm: Normal rate.  Pulmonary:     Effort: Pulmonary effort is normal.  Musculoskeletal:        General: Swelling and tenderness present. Normal range of motion.     Cervical back: Normal range of motion.  Skin:  General: Skin is warm and dry.     Findings: Bruising and laceration present.       Neurological:     Mental Status: She is alert and oriented to person, place, and time.  Psychiatric:        Mood and Affect: Mood and affect normal.        Behavior: Behavior normal.      UC Treatments / Results  Labs (all labs ordered are listed, but only abnormal results are displayed) Labs Reviewed - No data to display  EKG   Radiology No results found.  Procedures Laceration Repair  Date/Time:  08/02/2020 4:13 PM Performed by: Noe Gens, PA-C Authorized by: Noe Gens, PA-C   Consent:    Consent obtained:  Verbal   Consent given by:  Patient   Risks, benefits, and alternatives were discussed: yes     Risks discussed:  Infection, pain, poor cosmetic result, poor wound healing, need for additional repair, nerve damage and vascular damage   Alternatives discussed:  No treatment and delayed treatment Universal protocol:    Patient identity confirmed:  Verbally with patient Laceration details:    Location:  Leg   Leg location:  R lower leg   Length (cm):  9   Depth (mm):  4 Pre-procedure details:    Preparation:  Patient was prepped and draped in usual sterile fashion Exploration:    Hemostasis achieved with:  Direct pressure and LET   Wound exploration: wound explored through full range of motion and entire depth of wound visualized     Wound extent: areolar tissue violated     Wound extent: no fascia violation noted, no foreign bodies/material noted, no muscle damage noted, no nerve damage noted, no tendon damage noted, no underlying fracture noted and no vascular damage noted     Contaminated: no   Treatment:    Area cleansed with:  Saline   Amount of cleaning:  Standard   Debridement:  None Skin repair:    Repair method:  Sutures and Steri-Strips   Suture size:  3-0   Suture material:  Prolene   Suture technique:  Simple interrupted   Number of sutures:  8   Number of Steri-Strips:  4 Approximation:    Approximation:  Close Repair type:    Repair type:  Simple Post-procedure details:    Dressing:  Bulky dressing   Procedure completion:  Tolerated   (including critical care time)  Medications Ordered in UC Medications  lidocaine-EPINEPHrine-tetracaine (LET) topical gel (3 mLs Topical Given 08/02/20 1446)  Tdap (BOOSTRIX) injection 0.5 mL (0.5 mLs Intramuscular Given 08/02/20 1448)    Initial Impression / Assessment and Plan / UC Course  I have  reviewed the triage vital signs and the nursing notes.  Pertinent labs & imaging results that were available during my care of the patient were reviewed by me and considered in my medical decision making (see chart for details).    Wound repaired as noted above Tdap updated today F/u in 2-3 days for wound recheck. Sutures to be removed in about 14 days Home care discussed, AVS provided  Final Clinical Impressions(s) / UC Diagnoses   Final diagnoses:  Laceration of right lower leg, initial encounter  Fall from slip, trip, or stumble, initial encounter     Discharge Instructions      You may take your previously prescribed pain medication to help with leg pain. Try to keep your leg elevated to help with pain and  swelling. Keep bandage on for 24-48 hours, then gently remove and replace with a clean dry bandage. You should keep wound covered while it heals to help protect it while it heals. Change the bandage at least once a day after the first bandage. Change more often if it gets wet or dirty.  Return to this urgent care in 2-3 days for a wound recheck to make sure you are healing well and not developing infection.  You will then need to return in 14 days for suture removal.     ED Prescriptions    None     PDMP not reviewed this encounter.   Noe Gens, [place] 08/02/20 1615

## 2020-08-02 NOTE — ED Triage Notes (Signed)
Pt c/o RT lower leg laceration since this morning when she cut it on a plastic stool about 1130 this morning. EMS called to help stop the bleeding because she takes blood thinners. Currently all wrapped up. Pain 9/10

## 2020-08-02 NOTE — Discharge Instructions (Signed)
°  You may take your previously prescribed pain medication to help with leg pain. Try to keep your leg elevated to help with pain and swelling. Keep bandage on for 24-48 hours, then gently remove and replace with a clean dry bandage. You should keep wound covered while it heals to help protect it while it heals. Change the bandage at least once a day after the first bandage. Change more often if it gets wet or dirty.  Return to this urgent care in 2-3 days for a wound recheck to make sure you are healing well and not developing infection.  You will then need to return in 14 days for suture removal.

## 2020-08-03 DIAGNOSIS — Z91040 Latex allergy status: Secondary | ICD-10-CM | POA: Diagnosis not present

## 2020-08-03 DIAGNOSIS — Z882 Allergy status to sulfonamides status: Secondary | ICD-10-CM | POA: Diagnosis not present

## 2020-08-03 DIAGNOSIS — Z91041 Radiographic dye allergy status: Secondary | ICD-10-CM | POA: Diagnosis not present

## 2020-08-03 DIAGNOSIS — I10 Essential (primary) hypertension: Secondary | ICD-10-CM | POA: Diagnosis not present

## 2020-08-03 DIAGNOSIS — S81811D Laceration without foreign body, right lower leg, subsequent encounter: Secondary | ICD-10-CM | POA: Diagnosis not present

## 2020-08-03 DIAGNOSIS — Z885 Allergy status to narcotic agent status: Secondary | ICD-10-CM | POA: Diagnosis not present

## 2020-08-03 DIAGNOSIS — I4891 Unspecified atrial fibrillation: Secondary | ICD-10-CM | POA: Diagnosis not present

## 2020-08-03 DIAGNOSIS — W19XXXA Unspecified fall, initial encounter: Secondary | ICD-10-CM | POA: Diagnosis not present

## 2020-08-03 DIAGNOSIS — Z79899 Other long term (current) drug therapy: Secondary | ICD-10-CM | POA: Diagnosis not present

## 2020-08-03 DIAGNOSIS — Z87891 Personal history of nicotine dependence: Secondary | ICD-10-CM | POA: Diagnosis not present

## 2020-08-03 DIAGNOSIS — R58 Hemorrhage, not elsewhere classified: Secondary | ICD-10-CM | POA: Diagnosis not present

## 2020-08-03 DIAGNOSIS — T8133XD Disruption of traumatic injury wound repair, subsequent encounter: Secondary | ICD-10-CM | POA: Diagnosis not present

## 2020-08-03 DIAGNOSIS — Z7982 Long term (current) use of aspirin: Secondary | ICD-10-CM | POA: Diagnosis not present

## 2020-08-03 DIAGNOSIS — I959 Hypotension, unspecified: Secondary | ICD-10-CM | POA: Diagnosis not present

## 2020-08-03 DIAGNOSIS — F329 Major depressive disorder, single episode, unspecified: Secondary | ICD-10-CM | POA: Diagnosis not present

## 2020-08-03 DIAGNOSIS — Z881 Allergy status to other antibiotic agents status: Secondary | ICD-10-CM | POA: Diagnosis not present

## 2020-08-03 DIAGNOSIS — Z91048 Other nonmedicinal substance allergy status: Secondary | ICD-10-CM | POA: Diagnosis not present

## 2020-08-03 DIAGNOSIS — R52 Pain, unspecified: Secondary | ICD-10-CM | POA: Diagnosis not present

## 2020-08-03 DIAGNOSIS — Z888 Allergy status to other drugs, medicaments and biological substances status: Secondary | ICD-10-CM | POA: Diagnosis not present

## 2020-08-05 ENCOUNTER — Emergency Department (INDEPENDENT_AMBULATORY_CARE_PROVIDER_SITE_OTHER)
Admission: RE | Admit: 2020-08-05 | Discharge: 2020-08-05 | Disposition: A | Discharge status: Home / Self Care | Payer: Medicare Other | Source: Ambulatory Visit | Attending: Family Medicine | Admitting: Family Medicine

## 2020-08-05 ENCOUNTER — Other Ambulatory Visit: Payer: Self-pay

## 2020-08-05 VITALS — BP 116/76 | HR 68 | Temp 98.1°F | Resp 20 | Ht 66.0 in | Wt 123.0 lb

## 2020-08-05 DIAGNOSIS — Z5189 Encounter for other specified aftercare: Secondary | ICD-10-CM

## 2020-08-05 MED ORDER — CEPHALEXIN 500 MG PO CAPS: 500.0000 mg | ORAL_CAPSULE | Freq: Two times a day (BID) | ORAL | 0 refills | Status: DC

## 2020-08-05 NOTE — ED Notes (Signed)
R lower leg wound redressed as per Dr. Francesca Oman order. Pt to change dressing daily and return for suture removal 10 days following 08/01/20. She verbalizes understanding.

## 2020-08-05 NOTE — ED Triage Notes (Signed)
Pt presents to urgent care for wound check of L lower leg. She had an injury w/ laceration on 08/02/20. Tilden Fossa, PA sutured site and wanted pt to return for wound check within 48-72 hours. Pt reports that area began to bleed on 1/28 am; she called EMS and went to [facility] where ER provider assessed wound and re-bandaged. Pt states no more stitches were required.

## 2020-08-05 NOTE — Discharge Instructions (Addendum)
Stitches can come out at 10 days Elevate for pain and swelling Take antibiotic for 5 days

## 2020-08-06 ENCOUNTER — Telehealth: Payer: Self-pay | Admitting: Family Medicine

## 2020-08-06 NOTE — Telephone Encounter (Signed)
Spoke with patient and she was seen at urgent care for a laceration on 1/27 on her leg.  She went back by EMS to the ER because her leg was bleeding so bad and could not stop it.  She followed up with the urgent care yesterday and they rewrapped.  She has been taking Tylenol.  She also stated that she has some oxycodone liquid that she takes for her neck and back pain.  She was concerned of why she is having so much pain and is that normal?  Her pain level is at a 10.  She was put on an antibiotic yesterday.

## 2020-08-06 NOTE — ED Provider Notes (Signed)
[facility]    CSN: [id number] Arrival date & time: 08/05/20  1204      History   Chief Complaint Chief Complaint  Patient presents with  . Wound Check    R lower leg    HPI [name] is a 75 y.o. female.   HPI   Patient is here for wound check.  She has been keeping the Ace wrap on the wound.  She has not had any drainage.  She has some increased redness today.  She states it is very painful.  Chart is reviewed.  She had an initial visit at this office and then a follow-up with the emergency room when she had persistent bleeding.  Past Medical History:  Diagnosis Date  . Anxiety   . Arthritis    "maybe in my back" (03/31/2018)  . Benign paroxysmal positional vertigo 06/08/2013  . Complication of anesthesia   . Fracture of multiple ribs 2015   "don't know from what; dx'd when I in hospital for 1st back OR" (03/31/2018)  . GERD (gastroesophageal reflux disease)   . Hair loss 04/12/2012  . Herpes   . History of blood transfusion    "twice; related to back OR" (03/31/2018)  . History of kidney stones   . Interstitial cystitis 11/06/2011  . 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 Mitral regurgitation   . Osteopenia 02/18/2012  . Osteoporosis   . PAF (paroxysmal atrial fibrillation) ([place]) 2012  . Peripheral neuropathy 11/06/2011  . PMDD (premenstrual dysphoric disorder)   . PONV (postoperative nausea and vomiting)    nausea, vomiting, hives and dizziness   . 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/2020   Complete bilateral atrial lesion set using cryothermy and bipolar radiofrequency ablation with clipping of LA appendage via right mini-thoracotomy approach  . 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 Seasonal allergies   . Vaginal delivery    ONE NSVD  . 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  Atrial fibrillation ([place]) 02/09/2020  . Long term (current) use of anticoagulants 02/09/2020  . Acute blood loss anemia 02/09/2020  . Debility 02/02/2020  . 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/2020  . Severe mitral regurgitation   . 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 Ankle swelling, left 10/20/2019  . Ankle swelling, right 10/20/2019  . Secondary hypercoagulable state ([place]) 08/19/2019  . Left inguinal hernia 03/11/2019  . Visit for monitoring Tikosyn therapy 03/29/2018  . Osteoporosis 09/23/2017  . Fatigue 02/18/2017  . Physical exam 04/21/2016  . Vertigo 11/16/2015  . B12 deficiency 10/01/2015  . Hearing loss due to cerumen impaction 08/23/2015  . Foot pain, right 05/23/2015  . Hyperlipidemia 04/19/2015  . Protein-calorie malnutrition ([place]) 10/19/2014  . Fracture of multiple ribs 08/07/2014  . Tachycardia   . Constipation 07/14/2014  . Vitamin B12 deficiency anemia, unspecified 06/19/2014  . Hypokalemia 06/19/2014  . Unspecified atrial fibrillation ([place]) 06/19/2014  . Other chronic pain 06/19/2014  . Polyneuropathy, unspecified 06/19/2014  . Zoster without complications [id number]  . Vitreous degeneration, bilateral 06/19/2014  . Nutritional deficiency, unspecified 06/19/2014  . Nocturia 06/19/2014  . Other symbolic dysfunctions [id number]  . Unsteadiness on feet 06/19/2014  . Unspecified osteoarthritis, unspecified site 06/19/2014  . Muscle weakness (generalized) 06/19/2014  .  Major depressive disorder, single episode, unspecified 06/19/2014  . Frequency of micturition 06/19/2014  . Encounter for orthopedic aftercare following scoliosis surgery 06/19/2014  . Cardiac arrhythmia, unspecified 06/19/2014  . Age-related nuclear cataract, unspecified eye 06/19/2014  . Scoliosis, unspecified 06/19/2014  . Lumbar back pain 05/19/2012  . Anxiety 02/18/2012  . Ureteric stone 02/18/2012  . Hereditary and idiopathic  peripheral neuropathy 11/06/2011  . S/P cervical spinal fusion 11/06/2011  . Gastro-esophageal reflux disease without esophagitis 11/06/2011  . 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 11/06/2011  . Melanoma of skin ([place]) 11/06/2011  . Personal history of other diseases of the circulatory system 11/06/2011  . Allergic rhinitis, unspecified 04/25/2010  . 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 Anxiety disorder, unspecified 03/04/2010  . Other malaise and fatigue 02/05/2010  . Vitamin D deficiency 01/28/2010  . Neoplasm of uncertain behavior of skin 01/25/2010  . Genital herpes 11/01/2009  . Herpes simplex virus (HSV) infection 11/01/2009  . Herpetic vulvovaginitis 10/23/2009  . Kidney stone 08/09/2009  . 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 Brachial neuritis or radiculitis 07/23/2009    Past Surgical History:  Procedure Laterality Date  . ANTERIOR CERVICAL DECOMP/DISCECTOMY FUSION  ~ 2003  . BACK SURGERY    . BREAST SURGERY     BREAST BIOPSY--RIGHT BENIGN  . BUNIONECTOMY Bilateral   . CARDIOVERSION N/A 01/26/2020   Procedure: CARDIOVERSION;  Surgeon: Geralynn Rile, MD;  Location: [facility];  Service: Cardiovascular;  Laterality: N/A;  . COSMETIC SURGERY  2016   "back of my neck; related to earlier fusion"  . CYSTOSCOPY W/ STONE MANIPULATION  "several times"  . DILATION AND CURETTAGE OF UTERUS    . FOREHEAD RECONSTRUCTION Right    "removed bone protruding out of my forehead"  . HARDWARE REMOVAL  2016   "related to neck OR"  . INCONTINENCE SURGERY    . IR THORACENTESIS ASP PLEURAL SPACE W/IMG GUIDE  02/16/2020  . MINIMALLY INVASIVE MAZE PROCEDURE N/A 01/17/2020   Procedure: MINIMALLY INVASIVE MAZE PROCEDURE;  Surgeon: Rexene Alberts, MD;  Location: [facility];  Service: Open Heart Surgery;  Laterality: N/A;  . MITRAL VALVE REPAIR Right 01/17/2020   Procedure: MINIMALLY INVASIVE MITRAL VALVE REPAIR (MVR) USING MEMO 4D 32MM;  Surgeon: Rexene Alberts,  MD;  Location: [facility];  Service: Open Heart Surgery;  Laterality: Right;  . POSTERIOR CERVICAL FUSION/FORAMINOTOMY  ~ 2008; 2015  . RIGHT/LEFT HEART CATH AND CORONARY ANGIOGRAPHY N/A 12/02/2019   Procedure: RIGHT/LEFT HEART CATH AND CORONARY ANGIOGRAPHY;  Surgeon: Burnell Blanks, MD;  Location: [facility];  Service: Cardiovascular;  Laterality: N/A;  . SHOULDER ARTHROSCOPY W/ ROTATOR CUFF REPAIR Right 2012  . SPINAL FUSION  06/2014 - 2018 X ?7   "scoliosis; my entire back"  . TEE WITHOUT CARDIOVERSION N/A 11/21/2019   Procedure: TRANSESOPHAGEAL ECHOCARDIOGRAM (TEE);  Surgeon: Josue Hector, MD;  Location: [facility] END[facility];  Service: Cardiovascular;  Laterality: N/A;  . TEE WITHOUT CARDIOVERSION N/A 01/17/2020   Procedure: TRANSESOPHAGEAL ECHOCARDIOGRAM (TEE);  Surgeon: Rexene Alberts, MD;  Location: [facility];  Service: Open Heart Surgery;  Laterality: N/A;  . TEE WITHOUT CARDIOVERSION N/A 01/26/2020   Procedure: TRANSESOPHAGEAL ECHOCARDIOGRAM (TEE);  Surgeon: Geralynn Rile, MD;  Location: [facility];  Service: Cardiovascular;  Laterality: N/A;  . TUBAL LIGATION    . VAGINAL HYSTERECTOMY     TVH    OB History    Gravida  1   Para  1   Term  1   Preterm  AB      Living  1     SAB      IAB      Ectopic      Multiple      Live Births  1            Home Medications    Prior to Admission medications   Medication Sig Start Date End Date Taking? Authorizing Provider  cephALEXin (KEFLEX) 500 MG capsule Take 1 capsule (500 mg total) by mouth 2 (two) times daily. 08/05/20  Yes Raylene Everts, MD  acetaminophen (TYLENOL) 500 MG tablet Take 500 mg by mouth every 6 (six) hours as needed for moderate pain.     [provider]  b complex vitamins tablet Take 1 tablet by mouth daily.    [provider]  Calcium Carbonate Antacid (TUMS CHEWY BITES PO) Take 1 tablet by mouth daily as needed (reflux).     [provider]   Calcium Citrate-Vitamin D (CALCIUM + D PO) Take 1 tablet by mouth daily. Patient not taking: Reported on 07/11/2020    [provider]  Carboxymethylcellul-Glycerin (LUBRICATING EYE DROPS OP) Place 1 drop into both eyes daily as needed (dry eyes).    [provider]  clonazePAM (KLONOPIN) 0.5 MG tablet Take 1 tablet (0.5 mg total) by mouth 2 (two) times daily as needed for anxiety. 03/16/20   [name], MD  cyclobenzaprine (FLEXERIL) 10 MG tablet Take 10 mg by mouth at bedtime as needed for muscle spasms.  12/10/17   [provider]  denosumab (PROLIA) 60 MG/ML SOSY injection Inject 60 mg into the skin every 6 (six) months.    [provider]  furosemide (LASIX) 40 MG tablet Take 1 tablet (40 mg total) by mouth daily. 03/09/20   Camnitz, Will Hassell Done, MD  lactulose (CHRONULAC) 10 GM/15ML solution Take 20 g by mouth daily as needed for moderate constipation (constipation.).     [provider]  levothyroxine (SYNTHROID) 50 MCG tablet Take 1 tablet (50 mcg total) by mouth daily. 07/11/20   [name], MD  meclizine (ANTIVERT) 25 MG tablet Take 1 tablet (25 mg total) by mouth 3 (three) times daily as needed for dizziness. 07/11/20   [name], MD  metoprolol tartrate (LOPRESSOR) 50 MG tablet Take 1 tablet by mouth twice daily 06/15/20   Camnitz, Ocie Doyne, MD  Nutritional Supplements (,FEEDING SUPPLEMENT, PROSOURCE PLUS) liquid Take 30 mLs by mouth 2 (two) times daily between meals. 02/08/20   Love, Ivan Anchors, PA-C  nystatin (MYCOSTATIN/NYSTOP) powder Apply topically 2 (two) times daily as needed (skin irritation (under breasts)). 02/08/20   Love, Ivan Anchors, PA-C  ondansetron (ZOFRAN) 4 MG tablet Take 4 mg by mouth every 8 (eight) hours as needed. 05/22/20   [provider]  oxyCODONE (ROXICODONE) 5 MG/5ML solution Take 4 mLs (4 mg total) by mouth every 4 (four) hours as needed for moderate pain. 02/08/20   Love, Ivan Anchors, PA-C   polyethylene glycol (MIRALAX / GLYCOLAX) packet Take 17 g by mouth daily as needed for mild constipation.    [provider]  Potassium 99 MG TABS 1 tablet Patient not taking: Reported on 07/11/2020    [provider]  rivaroxaban (XARELTO) 20 MG TABS tablet Take 1 tablet (20 mg total) by mouth daily with supper. 04/04/20   Camnitz, Ocie Doyne, MD  sodium chloride (OCEAN) 0.65 % SOLN nasal spray Place 1 spray into both nostrils as needed for  congestion (nose bleeds).    [provider]  valACYclovir (VALTREX) 500 MG tablet Take 500 mg by mouth daily as needed (breakouts).  07/15/19   [provider]    Family History Family History  Problem Relation Age of Onset  . Diabetes Father   . Hyperlipidemia Sister   . Heart disease Sister   . Stroke Sister   . Diabetes Brother   . Hyperlipidemia Sister   . Heart disease Sister   . Arthritis Mother   . Heart disease Mother   . Uterine cancer Mother   . Diabetes Brother   . Heart Problems Brother     Social History Social History   Tobacco Use  . Smoking status: Former Smoker    Packs/day: 0.10    Years: 14.00    Pack years: 1.40    Types: Cigarettes  . Smokeless tobacco: Never Used  . Tobacco comment: 03/31/2018 "quit ~ 1980; someday smoker when I did smoke; never addicted"  Vaping Use  . Vaping Use: Never used  Substance Use Topics  . Alcohol use: Never  . Drug use: Yes    Types: Oxycodone, Benzodiazepines    Comment: 03/31/2018 "for chronic neck and back pain", takes Klonopin at times.      Allergies   Buprenorphine, Cymbalta [duloxetine hcl], Erythromycin, Hydrocodone, Hydromorphone, Iodinated diagnostic agents, Iodine, Levofloxacin, Metrizamide, Nsaids, Nucynta [tapentadol hcl], Nucynta [tapentadol], Other, Oxycodone, Pentazocine, Septra [bactrim], Sulfasalazine, Adhesive [tape], Latex, Morphine, and Sulfa antibiotics   Review of Systems Review of Systems See HPI  Physical Exam Triage  Vital Signs ED Triage Vitals  Enc Vitals Group     BP 08/05/20 1348 116/76     Pulse Rate 08/05/20 1348 68     Resp 08/05/20 1348 20     Temp 08/05/20 1348 98.1 F (36.7 C)     Temp Source 08/05/20 1348 Oral     SpO2 08/05/20 1348 96 %     Weight 08/05/20 1346 123 lb (55.8 kg)     Height 08/05/20 1346 5\' 6"  (1.676 m)     Head Circumference --      Peak Flow --      Pain Score 08/05/20 1345 8     Pain Loc --      Pain Edu? --      Excl. in [place]? --    No data found.  Updated Vital Signs BP 116/76 (BP Location: Right Arm)   Pulse 68   Temp 98.1 F (36.7 C) (Oral)   Resp 20   Ht 5\' 6"  (1.676 m)   Wt 55.8 kg   SpO2 96%   BMI 19.85 kg/m   Physical Exam Constitutional:      General: She is not in acute distress.    Appearance: She is well-developed and well-nourished.     Comments: Appears uncomfortable  HENT:     Head: Normocephalic and atraumatic.     Mouth/Throat:     Mouth: Oropharynx is clear and moist.  Eyes:     Conjunctiva/sclera: Conjunctivae normal.     Pupils: Pupils are equal, round, and reactive to light.  Cardiovascular:     Rate and Rhythm: Normal rate.  Pulmonary:     Effort: Pulmonary effort is normal. No respiratory distress.  Abdominal:     General: There is no distension.     Palpations: Abdomen is soft.  Musculoskeletal:        General: No edema. Normal range of motion.     Cervical  back: Normal range of motion.  Skin:    General: Skin is warm and dry.     Comments: Right anterior leg has a V-shaped laceration.  Clean dry and intact.  Sutures in place.  There is some surrounding erythema, especially distally with swelling from the Ace wrap.  The center of the wound is somewhat dusky  Neurological:     Mental Status: She is alert.  Psychiatric:        Behavior: Behavior normal.      UC Treatments / Results  Labs (all labs ordered are listed, but only abnormal results are displayed) Labs Reviewed - No data to  display  EKG   Radiology No results found.  Procedures Procedures (including critical care time)  Medications Ordered in UC Medications - No data to display  Initial Impression / Assessment and Plan / UC Course  I have reviewed the triage vital signs and the nursing notes.  Pertinent labs & imaging results that were available during my care of the patient were reviewed by me and considered in my medical decision making (see chart for details).      Final Clinical Impressions(s) / UC Diagnoses   Final diagnoses:  Visit for wound check     Discharge Instructions     Stitches can come out at 10 days Elevate for pain and swelling Take antibiotic for 5 days   ED Prescriptions    Medication Sig Dispense Auth. Provider   cephALEXin (KEFLEX) 500 MG capsule Take 1 capsule (500 mg total) by mouth 2 (two) times daily. 10 capsule Raylene Everts, MD     PDMP not reviewed this encounter.   Raylene Everts, MD 08/06/20 [phone]

## 2020-08-06 NOTE — Telephone Encounter (Signed)
Called and spoke with patient and per Dr.tabori Pt is to elevate leg w/ice for 15 minutes every hr for pain. Pt understood. No further concerns at this time.

## 2020-08-06 NOTE — Telephone Encounter (Signed)
She should keep the leg elevated, wrapped, ice for 15 minutes every hour as needed for pain and take Oxycodone only if needed.  The pain should improve w/ time

## 2020-08-06 NOTE — Telephone Encounter (Signed)
Patient called stating that she had to get stitches in her leg at urgent care over the weekend.  Patient is concerned that she is still experiencing a lot of pain.  She would like to know what Dr. [name] would recommend.  Please advise.

## 2020-08-09 ENCOUNTER — Other Ambulatory Visit: Payer: Self-pay | Admitting: *Deleted

## 2020-08-09 NOTE — Patient Outreach (Signed)
[facility]) Care Management  08/09/2020  [name] 02/18/1946 [id number]   [facility] warm transfer to [facility] embedded program  [name] is a 75 year old patient who was referred to [facility] on8/6/21 Referral Source:Victoria Doug Sou, [facility] RN CM hospital liaison Referral Reason:Please assign to [place] Coordinator for complex careanddisease management follow up calls, medication questions/needsandassess for further needs Insurance:Next[employer] Medicare Last ED visits 08/05/20 [facility] urgent care - right lower leg wound check 08/03/20 ED laceration of right leg 08/02/20 ED laceration of right leg  Last admissionwas 02/02/20 to 02/08/20 For debility Neuropathy Paroxysmal Atrial fibrillation (PAF)   Warm transfer to [facility] embedded programs Explained the [facility] embedded program and warm transfer to Regina Friedt She agrees to be active with [facility] embedded program telephonically  Voiced medication cost difficulties can afford medicines but reporting [place] cost strain related to annual deductible,  She voiced concerns with medications and some difficulty with bills  Put on xarelto Aware of her annual insurance deductible  [phone] deductible  $500+ in January 2022 on medications Already participates with Wynetta Emery and [place] patient assistance program for xarelto in mid of each year Has already completed a [employer] application first  Aware she has to pay $1000 out of pocket first 6 months before [employer] can assist her  She reports she spoke with her cardiology office  Dr Wynonia Lawman use to give her samples Presently getting half month of Xarelto 14 pills  Has some at her MD office she has not picked up yet   Also take liquid oxycodone and has a cost concern with it She can not take the pill for oxycodone She has called medicare and social security Discussed [facility] services of SW & pharmacy Preferences at this time per pt is to have [facility] SW and & pharmacy  assist if possible resources available she is not aware of    Plan [facility] RN CM will close [facility] complex case after warm transfer to [facility] embedded program  [facility] pharmacy referrals completed Complex [facility] case closure letters sent to patient and MD- assigned embedded [facility] staff provided   Joelene Millin L. Lavina Hamman, RN, BSN, [place] Coordinator Office number [phone] Mobile number [phone]  Main [facility]N number [phone] Fax number [phone]

## 2020-08-09 NOTE — Patient Outreach (Signed)
[facility]) Care Management  08/09/2020  [name] Apr 03, 1946 [id number]  Referral for medication assistance from Jackelyn Poling, RN sent to [place] [facility] Management Assistant [phone]

## 2020-08-12 ENCOUNTER — Encounter: Payer: Self-pay | Admitting: Family Medicine

## 2020-08-12 ENCOUNTER — Emergency Department (INDEPENDENT_AMBULATORY_CARE_PROVIDER_SITE_OTHER): Payer: Medicare Other

## 2020-08-12 ENCOUNTER — Other Ambulatory Visit: Payer: Self-pay

## 2020-08-12 ENCOUNTER — Emergency Department (INDEPENDENT_AMBULATORY_CARE_PROVIDER_SITE_OTHER)
Admission: RE | Admit: 2020-08-12 | Discharge: 2020-08-12 | Disposition: A | Discharge status: Home / Self Care | Payer: Medicare Other | Source: Ambulatory Visit

## 2020-08-12 VITALS — BP 145/83 | HR 66 | Temp 98.0°F | Resp 18 | Ht 66.0 in | Wt 122.8 lb

## 2020-08-12 DIAGNOSIS — L03115 Cellulitis of right lower limb: Secondary | ICD-10-CM

## 2020-08-12 DIAGNOSIS — M7989 Other specified soft tissue disorders: Secondary | ICD-10-CM | POA: Diagnosis not present

## 2020-08-12 DIAGNOSIS — L089 Local infection of the skin and subcutaneous tissue, unspecified: Secondary | ICD-10-CM

## 2020-08-12 DIAGNOSIS — S81811A Laceration without foreign body, right lower leg, initial encounter: Secondary | ICD-10-CM | POA: Diagnosis not present

## 2020-08-12 DIAGNOSIS — T148XXA Other injury of unspecified body region, initial encounter: Secondary | ICD-10-CM

## 2020-08-12 MED ORDER — DOXYCYCLINE HYCLATE 100 MG PO CAPS: 100.0000 mg | ORAL_CAPSULE | Freq: Two times a day (BID) | ORAL | 0 refills | Status: AC

## 2020-08-12 NOTE — Discharge Instructions (Addendum)
  Please take antibiotics as prescribed and be sure to complete entire course even if you start to feel better to ensure infection does not come back; unless another medical provider changes your medication based on their exam.  Try to keep wound covered to help keep protected and clean. You can change the bandage at least once a day.  Follow up Tuesday as previously scheduled and mention your wound. You may need referral to a wound specialist.

## 2020-08-12 NOTE — ED Provider Notes (Signed)
[name]    CSN: [id number] Arrival date & time: 08/12/20  1052      History   Chief Complaint Chief Complaint  Patient presents with  . Suture / Staple Removal  . Wound Check    HPI [name] is a 75 y.o. female.   HPI  [name] is a 75 y.o. female presenting to UC with c/o continued Right lower leg pain and redness after having sutures placed on 08/02/20. Pt initially cut her leg on a plastic stool in her laundry room. Pt concerned she may have a fracture with the continued pain. She was seen in the ED on 08/03/20 due to wound bleeding, she is on Xarelto.  At the ED, they applied quick clot as well as wound glue to the wound.  She was seen at [facility] on 08/05/20 for wound check, started on Keflex due to erythema and pain c/w infection. She has completed the keflex but no improvement but states it has not bled again.  Denies fever, chills, n/v/d.  Past Medical History:  Diagnosis Date  . Anxiety   . Arthritis    "maybe in my back" (03/31/2018)  . Benign paroxysmal positional vertigo 06/08/2013  . Complication of anesthesia   . Fracture of multiple ribs 2015   "don't know from what; dx'd when I in hospital for 1st back OR" (03/31/2018)  . GERD (gastroesophageal reflux disease)   . Hair loss 04/12/2012  . Herpes   . History of blood transfusion    "twice; related to back OR" (03/31/2018)  . History of kidney stones   . Interstitial cystitis 11/06/2011  . 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 Mitral regurgitation   . Osteopenia 02/18/2012  . Osteoporosis   . PAF (paroxysmal atrial fibrillation) ([place]) 2012  . Peripheral neuropathy 11/06/2011  . PMDD (premenstrual dysphoric disorder)   . PONV (postoperative nausea and vomiting)    nausea, vomiting, hives and dizziness   . 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/2020   Complete bilateral atrial lesion set using cryothermy and bipolar radiofrequency ablation with clipping of LA appendage via right  mini-thoracotomy approach  . 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 Seasonal allergies   . Vaginal delivery    ONE NSVD  . 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 Atrial fibrillation ([place]) 02/09/2020  . Long term (current) use of anticoagulants 02/09/2020  . Acute blood loss anemia 02/09/2020  . Debility 02/02/2020  . 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/2020  . Severe mitral regurgitation   . 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 Ankle swelling, left 10/20/2019  . Ankle swelling, right 10/20/2019  . Secondary hypercoagulable state ([place]) 08/19/2019  . Left inguinal hernia 03/11/2019  . Visit for monitoring Tikosyn therapy 03/29/2018  . Osteoporosis 09/23/2017  . Fatigue 02/18/2017  . Physical exam 04/21/2016  . Vertigo 11/16/2015  . B12 deficiency 10/01/2015  . Hearing loss due to cerumen impaction 08/23/2015  . Foot pain, right 05/23/2015  . Hyperlipidemia 04/19/2015  . Protein-calorie malnutrition ([place]) 10/19/2014  . Fracture of multiple ribs 08/07/2014  . Tachycardia   . Constipation 07/14/2014  . Vitamin B12 deficiency anemia, unspecified 06/19/2014  . Hypokalemia 06/19/2014  . Unspecified atrial fibrillation ([place]) 06/19/2014  .  Other chronic pain 06/19/2014  . Polyneuropathy, unspecified 06/19/2014  . Zoster without complications [id number]  . Vitreous degeneration, bilateral 06/19/2014  . Nutritional deficiency, unspecified 06/19/2014  . Nocturia 06/19/2014  . Other symbolic dysfunctions [id number]  . Unsteadiness on feet 06/19/2014  . Unspecified osteoarthritis, unspecified site 06/19/2014  . Muscle weakness (generalized) 06/19/2014  . Major depressive disorder, single episode, unspecified 06/19/2014  . Frequency of micturition 06/19/2014  .  Encounter for orthopedic aftercare following scoliosis surgery 06/19/2014  . Cardiac arrhythmia, unspecified 06/19/2014  . Age-related nuclear cataract, unspecified eye 06/19/2014  . Scoliosis, unspecified 06/19/2014  . Lumbar back pain 05/19/2012  . Anxiety 02/18/2012  . Ureteric stone 02/18/2012  . Hereditary and idiopathic peripheral neuropathy 11/06/2011  . S/P cervical spinal fusion 11/06/2011  . Gastro-esophageal reflux disease without esophagitis 11/06/2011  . 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 11/06/2011  . Melanoma of skin ([place]) 11/06/2011  . Personal history of other diseases of the circulatory system 11/06/2011  . Allergic rhinitis, unspecified 04/25/2010  . 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 Anxiety disorder, unspecified 03/04/2010  . Other malaise and fatigue 02/05/2010  . Vitamin D deficiency 01/28/2010  . Neoplasm of uncertain behavior of skin 01/25/2010  . Genital herpes 11/01/2009  . Herpes simplex virus (HSV) infection 11/01/2009  . Herpetic vulvovaginitis 10/23/2009  . Kidney stone 08/09/2009  . 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 Brachial neuritis or radiculitis 07/23/2009    Past Surgical History:  Procedure Laterality Date  . ANTERIOR CERVICAL DECOMP/DISCECTOMY FUSION  ~ 2003  . BACK SURGERY    . BREAST SURGERY     BREAST BIOPSY--RIGHT BENIGN  . BUNIONECTOMY Bilateral   . CARDIOVERSION N/A 01/26/2020   Procedure: CARDIOVERSION;  Surgeon: Geralynn Rile, MD;  Location: [place];  Service: Cardiovascular;  Laterality: N/A;  . COSMETIC SURGERY  2016   "back of my neck; related to earlier fusion"  . CYSTOSCOPY W/ STONE MANIPULATION  "several times"  . DILATION AND CURETTAGE OF UTERUS    . FOREHEAD RECONSTRUCTION Right    "removed bone protruding out of my forehead"  . HARDWARE REMOVAL  2016   "related to neck OR"  . INCONTINENCE SURGERY    . IR THORACENTESIS ASP PLEURAL SPACE W/IMG GUIDE  02/16/2020  .  MINIMALLY INVASIVE MAZE PROCEDURE N/A 01/17/2020   Procedure: MINIMALLY INVASIVE MAZE PROCEDURE;  Surgeon: Rexene Alberts, MD;  Location: [place];  Service: Open Heart Surgery;  Laterality: N/A;  . MITRAL VALVE REPAIR Right 01/17/2020   Procedure: MINIMALLY INVASIVE MITRAL VALVE REPAIR (MVR) USING MEMO 4D 32MM;  Surgeon: Rexene Alberts, MD;  Location: [place];  Service: Open Heart Surgery;  Laterality: Right;  . POSTERIOR CERVICAL FUSION/FORAMINOTOMY  ~ 2008; 2015  . RIGHT/LEFT HEART CATH AND CORONARY ANGIOGRAPHY N/A 12/02/2019   Procedure: RIGHT/LEFT HEART CATH AND CORONARY ANGIOGRAPHY;  Surgeon: Burnell Blanks, MD;  Location: [place] [facility];  Service: Cardiovascular;  Laterality: N/A;  . SHOULDER ARTHROSCOPY W/ ROTATOR CUFF REPAIR Right 2012  . SPINAL FUSION  06/2014 - 2018 X ?7   "scoliosis; my entire back"  . TEE WITHOUT CARDIOVERSION N/A 11/21/2019   Procedure: TRANSESOPHAGEAL ECHOCARDIOGRAM (TEE);  Surgeon: Josue Hector, MD;  Location[facility];  Service: Cardiovascular;  Laterality: N/A;  . TEE WITHOUT CARDIOVERSION N/A 01/17/2020   Procedure: TRANSESOPHAGEAL ECHOCARDIOGRAM (TEE);  Surgeon: Rexene Alberts, MD;  Location: [place];  Service: Open Heart Surgery;  Laterality: N/A;  . TEE WITHOUT CARDIOVERSION N/A 01/26/2020   Procedure:  TRANSESOPHAGEAL ECHOCARDIOGRAM (TEE);  Surgeon: Geralynn Rile, MD;  Location: [place];  Service: Cardiovascular;  Laterality: N/A;  . TUBAL LIGATION    . VAGINAL HYSTERECTOMY     TVH    OB History    Gravida  1   Para  1   Term  1   Preterm      AB      Living  1     SAB      IAB      Ectopic      Multiple      Live Births  1            Home Medications    Prior to Admission medications   Medication Sig Start Date End Date Taking? Authorizing Provider  doxycycline (VIBRAMYCIN) 100 MG capsule Take 1 capsule (100 mg total) by mouth 2 (two) times daily for 7 days. 08/12/20 08/19/20 Yes Lalani Winkles, Bronwen Betters, PA-C   acetaminophen (TYLENOL) 500 MG tablet Take 500 mg by mouth every 6 (six) hours as needed for moderate pain.     [provider]  b complex vitamins tablet Take 1 tablet by mouth daily.    [provider]  Calcium Carbonate Antacid (TUMS CHEWY BITES PO) Take 1 tablet by mouth daily as needed (reflux).     [provider]  Calcium Citrate-Vitamin D (CALCIUM + D PO) Take 1 tablet by mouth daily. Patient not taking: Reported on 07/11/2020    [provider]  Carboxymethylcellul-Glycerin (LUBRICATING EYE DROPS OP) Place 1 drop into both eyes daily as needed (dry eyes).    [provider]  cephALEXin (KEFLEX) 500 MG capsule Take 1 capsule (500 mg total) by mouth 2 (two) times daily. 08/05/20   Raylene Everts, MD  clonazePAM (KLONOPIN) 0.5 MG tablet Take 1 tablet (0.5 mg total) by mouth 2 (two) times daily as needed for anxiety. 03/16/20   [name], MD  cyclobenzaprine (FLEXERIL) 10 MG tablet Take 10 mg by mouth at bedtime as needed for muscle spasms.  12/10/17   [provider]  denosumab (PROLIA) 60 MG/ML SOSY injection Inject 60 mg into the skin every 6 (six) months.    [provider]  furosemide (LASIX) 40 MG tablet Take 1 tablet (40 mg total) by mouth daily. 03/09/20   Camnitz, Will Hassell Done, MD  lactulose (CHRONULAC) 10 GM/15ML solution Take 20 g by mouth daily as needed for moderate constipation (constipation.).     [provider]  levothyroxine (SYNTHROID) 50 MCG tablet Take 1 tablet (50 mcg total) by mouth daily. 07/11/20   [name], MD  meclizine (ANTIVERT) 25 MG tablet Take 1 tablet (25 mg total) by mouth 3 (three) times daily as needed for dizziness. 07/11/20   [name], MD  metoprolol tartrate (LOPRESSOR) 50 MG tablet Take 1 tablet by mouth twice daily 06/15/20   Camnitz, Ocie Doyne, MD  Nutritional Supplements (,FEEDING SUPPLEMENT, PROSOURCE PLUS) liquid Take 30 mLs by mouth 2 (two) times daily  between meals. 02/08/20   Love, Ivan Anchors, PA-C  nystatin (MYCOSTATIN/NYSTOP) powder Apply topically 2 (two) times daily as needed (skin irritation (under breasts)). 02/08/20   Love, Ivan Anchors, PA-C  ondansetron (ZOFRAN) 4 MG tablet Take 4 mg by mouth every 8 (eight) hours as needed. 05/22/20   [provider]  oxyCODONE (ROXICODONE) 5 MG/5ML solution Take 4 mLs (4 mg total) by mouth every 4 (four) hours as needed for moderate pain. 02/08/20  Love, Pamela S, PA-C  polyethylene glycol (MIRALAX / GLYCOLAX) packet Take 17 g by mouth daily as needed for mild constipation.    [provider]  Potassium 99 MG TABS 1 tablet Patient not taking: Reported on 07/11/2020    [provider]  rivaroxaban (XARELTO) 20 MG TABS tablet Take 1 tablet (20 mg total) by mouth daily with supper. 04/04/20   Camnitz, Ocie Doyne, MD  sodium chloride (OCEAN) 0.65 % SOLN nasal spray Place 1 spray into both nostrils as needed for congestion (nose bleeds).    [provider]  valACYclovir (VALTREX) 500 MG tablet Take 500 mg by mouth daily as needed (breakouts).  07/15/19   [provider]    Family History Family History  Problem Relation Age of Onset  . Diabetes Father   . Hyperlipidemia Sister   . Heart disease Sister   . Stroke Sister   . Diabetes Brother   . Hyperlipidemia Sister   . Heart disease Sister   . Arthritis Mother   . Heart disease Mother   . Uterine cancer Mother   . Diabetes Brother   . Heart Problems Brother     Social History Social History   Tobacco Use  . Smoking status: Former Smoker    Packs/day: 0.10    Years: 14.00    Pack years: 1.40    Types: Cigarettes  . Smokeless tobacco: Never Used  . Tobacco comment: 03/31/2018 "quit ~ 1980; someday smoker when I did smoke; never addicted"  Vaping Use  . Vaping Use: Never used  Substance Use Topics  . Alcohol use: Never  . Drug use: Yes    Types: Oxycodone, Benzodiazepines    Comment: 03/31/2018 "for  chronic neck and back pain", takes Klonopin at times.      Allergies   Buprenorphine, Cymbalta [duloxetine hcl], Erythromycin, Hydrocodone, Hydromorphone, Iodinated diagnostic agents, Iodine, Levofloxacin, Metrizamide, Nsaids, Nucynta [tapentadol hcl], Nucynta [tapentadol], Other, Oxycodone, Pentazocine, Septra [bactrim], Sulfasalazine, Adhesive [tape], Latex, Morphine, and Sulfa antibiotics   Review of Systems Review of Systems  Constitutional: Negative for chills and fever.  Skin: Positive for color change and wound.     Physical Exam Triage Vital Signs ED Triage Vitals  Enc Vitals Group     BP 08/12/20 1109 (!) 145/83     Pulse Rate 08/12/20 1109 66     Resp 08/12/20 1109 18     Temp 08/12/20 1109 98 F (36.7 C)     Temp Source 08/12/20 1109 Oral     SpO2 08/12/20 1109 99 %     Weight 08/12/20 1112 122 lb 12.7 oz (55.7 kg)     Height 08/12/20 1112 5\' 6"  (1.676 m)     Head Circumference --      Peak Flow --      Pain Score 08/12/20 1111 8     Pain Loc --      Pain Edu? --      Excl. in [place]? --    No data found.  Updated Vital Signs BP (!) 145/83 (BP Location: Left Arm)   Pulse 66   Temp 98 F (36.7 C) (Oral)   Resp 18   Ht 5\' 6"  (1.676 m)   Wt 122 lb 12.7 oz (55.7 kg)   SpO2 99%   BMI 19.82 kg/m   Visual Acuity Right Eye Distance:   Left Eye Distance:   Bilateral Distance:    Right Eye Near:   Left Eye Near:    Bilateral  Near:     Physical Exam Vitals and nursing note reviewed.  Constitutional:      Appearance: Normal appearance. She is well-developed and well-nourished.  HENT:     Head: Normocephalic and atraumatic.  Eyes:     Extraocular Movements: EOM normal.  Cardiovascular:     Rate and Rhythm: Normal rate.  Pulmonary:     Effort: Pulmonary effort is normal.  Musculoskeletal:        General: Swelling and tenderness present. Normal range of motion.     Cervical back: Normal range of motion.     Comments: Right lower leg edema, full ROM  knee, ankle and toes. Calf is soft, non-tender. Tenderness to anterior lower leg around the wound.  Skin:    General: Skin is warm and dry.     Comments: Right lower leg, anterior aspect: triangular shaped laceration, sutures and steri-strips with wound glue in place. Surrounding erythema, mild edema, tenderness.   Neurological:     Mental Status: She is alert and oriented to person, place, and time.  Psychiatric:        Mood and Affect: Mood and affect normal.        Behavior: Behavior normal.      UC Treatments / Results  Labs (all labs ordered are listed, but only abnormal results are displayed) Labs Reviewed - No data to display  EKG   Radiology DG Tibia/Fibula Right  Result Date: 08/12/2020 CLINICAL DATA:  Laceration to the right lower extremity 1/27/2. Increased pain in the anterior tibia since. EXAM: RIGHT TIBIA AND FIBULA - 2 VIEW COMPARISON:  Ankle radiographs 10/20/2019 FINDINGS: Pressure dressing is applied to the lower leg, just above the ankle. Proximal tibia and fibular are unremarkable. No distal osseous abnormalities are present. Extensive soft tissue swelling is present about the ankle. No gas is evident in the soft tissues. No significant effusion is present. IMPRESSION: 1. Extensive soft tissue swelling about the ankle. This may represent cellulitis or passive edema. 2. No acute osseous abnormality. 3. No gas in the soft tissues. Electronically Signed   By: San Morelle M.D.   On: 08/12/2020 12:20    Procedures Wound Care  Date/Time: 08/12/2020 1:38 PM Performed by: Noe Gens, PA-C Authorized by: Noe Gens, PA-C   Consent:    Consent obtained:  Verbal   Consent given by:  Patient   Risks, benefits, and alternatives were discussed: yes     Risks discussed:  Bleeding, infection, pain, poor cosmetic result, nerve damage and incomplete drainage   Alternatives discussed:  No treatment and delayed treatment Universal protocol:    Patient identity  confirmed:  Verbally with patient Pre-procedure details:    Preparation: Patient was prepped and draped in usual sterile fashion   Anesthesia:    Anesthesia method:  None Procedure details:    Wound age (days):  10   Wound surface area (sq cm):  8   Debridement performed: Yes     Debridement type: selective     Debridement level: subcutaneous tissue     Debridement mechanism:  Forceps and scissors   Devitalized tissue debrided: clots, necrotic debris and slough     Devitalized tissue debrided comment:  Dried wound glue   Infected skin BSA:  Up to 10% Skin layer closed with:    Dehiscence repair type: simple closure     Wound care performed:  Steri-strips placed and steri-strips removed (four old dirty steri-strips gently removed, 2 clean dry steri-strips applied) Dressing:  Dressing applied:  4x4 and Kerlix   Wrapped with:  Elastic bandage 4 inch Post-procedure details:    Procedure completion:  Tolerated well, no immediate complications   (including critical care time)  Medications Ordered in UC Medications - No data to display  Initial Impression / Assessment and Plan / UC Course  I have reviewed the triage vital signs and the nursing notes.  Pertinent labs & imaging results that were available during my care of the patient were reviewed by me and considered in my medical decision making (see chart for details).    Wound addressed as noted above Encouraged close f/u with PCP on Tuesday. Pt states she has previously scheduled appointment for her thyroid on Tuesday, encouraged to also have wound rechecked. May need referral to wound-care.  Rx: doxycycline  Final Clinical Impressions(s) / UC Diagnoses   Final diagnoses:  Infected laceration  Cellulitis of right lower leg     Discharge Instructions      Please take antibiotics as prescribed and be sure to complete entire course even if you start to feel better to ensure infection does not come back; unless another  medical provider changes your medication based on their exam.  Try to keep wound covered to help keep protected and clean. You can change the bandage at least once a day.  Follow up Tuesday as previously scheduled and mention your wound. You may need referral to a wound specialist.     ED Prescriptions    Medication Sig Dispense Auth. Provider   doxycycline (VIBRAMYCIN) 100 MG capsule Take 1 capsule (100 mg total) by mouth 2 (two) times daily for 7 days. 14 capsule Noe Gens, [place]     PDMP not reviewed this encounter.   Noe Gens, [place] 08/12/20 1346

## 2020-08-12 NOTE — ED Triage Notes (Addendum)
Here for suture removal from RLE  Complicated by clotting glue applied in ER  Redness noted around wound Concerned about possible fracture due to ongoing pain  Will need to be seen by provider

## 2020-08-13 ENCOUNTER — Telehealth: Payer: Self-pay | Admitting: Family Medicine

## 2020-08-13 NOTE — Chronic Care Management (AMB) (Signed)
  Chronic Care Management   Note  08/13/2020 Name: Elaura J Hodgens MRN: [id number] DOB: 09/04/1945  Norma J Sigley is a 75 y.o. year old female who is a primary care patient of Tabori, Katherine E, MD. I reached out to Brecklyn J Schecter by phone today in response to a referral sent by Ms. Prairie J Seifert's PCP, Tabori, Katherine E, MD.   Ms. Dickens was given information about Chronic Care Management services today including:  1. CCM service includes personalized support from designated clinical staff supervised by her physician, including individualized plan of care and coordination with other care providers 2. 24/7 contact phone numbers for assistance for urgent and routine care needs. 3. Service will only be billed when office clinical staff spend 20 minutes or more in a month to coordinate care. 4. Only one practitioner may furnish and bill the service in a calendar month. 5. The patient may stop CCM services at any time (effective at the end of the month) by phone call to the office staff.   Patient wishes to consider information provided and/or speak with a member of the care team before deciding about enrollment in care management services.   Follow up plan:   Tresha Hayes Upstream Scheduler 

## 2020-08-13 NOTE — Progress Notes (Signed)
  Chronic Care Management   Note  08/13/2020 Name: [name] MRN: [id number] DOB: 07-06-1946  [name] is a 75 y.o. year old female who is a primary care patient of [name], [name], MD. I reached out to [name] by phone today in response to a referral sent by [name]'s PCP, [name], MD.   [name] was given information about Chronic Care Management services today including:  1. CCM service includes personalized support from designated clinical staff supervised by her physician, including individualized plan of care and coordination with other care providers 2. 24/7 contact phone numbers for assistance for urgent and routine care needs. 3. Service will only be billed when office clinical staff spend 20 minutes or more in a month to coordinate care. 4. Only one practitioner may furnish and bill the service in a calendar month. 5. The patient may stop CCM services at any time (effective at the end of the month) by phone call to the office staff.   Patient wishes to consider information provided and/or speak with a member of the care team before deciding about enrollment in care management services.   Follow up plan:   Lauretta Grill Upstream Scheduler

## 2020-08-14 ENCOUNTER — Telehealth: Payer: Self-pay | Admitting: Family Medicine

## 2020-08-14 ENCOUNTER — Encounter: Payer: Self-pay | Admitting: Family Medicine

## 2020-08-14 ENCOUNTER — Ambulatory Visit (INDEPENDENT_AMBULATORY_CARE_PROVIDER_SITE_OTHER): Payer: Medicare Other | Admitting: Family Medicine

## 2020-08-14 ENCOUNTER — Other Ambulatory Visit: Payer: Self-pay

## 2020-08-14 VITALS — BP 112/70 | HR 65 | Temp 97.3°F | Resp 19 | Ht 66.0 in | Wt 126.0 lb

## 2020-08-14 DIAGNOSIS — S81811D Laceration without foreign body, right lower leg, subsequent encounter: Secondary | ICD-10-CM

## 2020-08-14 DIAGNOSIS — E039 Hypothyroidism, unspecified: Secondary | ICD-10-CM | POA: Diagnosis not present

## 2020-08-14 LAB — T3, FREE: T3, Free: 2.7 pg/mL (ref 2.3–4.2)

## 2020-08-14 LAB — TSH: TSH: 6.63 u[IU]/mL — ABNORMAL HIGH (ref 0.35–4.50)

## 2020-08-14 LAB — T4, FREE: Free T4: 1.25 ng/dL (ref 0.60–1.60)

## 2020-08-14 NOTE — Patient Instructions (Signed)
Schedule a lab visit in 1 month to recheck thyroid levels We'll call you with your wound care appt Keep area clean and dry.  You can gently wash w/ soap and water and pat dry Take the Doxycycline twice daily- take w/ food Hair only grows 1/2 inch each month- so we have to be patient! Call with any questions or concerns Hang in there!

## 2020-08-14 NOTE — Telephone Encounter (Signed)
Error

## 2020-08-14 NOTE — Progress Notes (Signed)
   Subjective:    Patient ID: [name], female    DOB: 06-May-1946, 76 y.o.   MRN: [id number]  HPI Hypothyroid- 'i've lost half of my hair'.  Is considering getting a wig.  Was started on Levothyroxine 65mcg.  Is again asking if her hair will come back.    Leg laceration- pt cut R lower leg on 1/27.  Pt on Xarelto.  She had to call EMS twice due to excessive bleeding.  Was told on 2/6 she had a wound infection.  Was started on Doxycycline BID.  Reports she has been in pain since her injury.  She hit the front of her leg in the laundry room.   Review of Systems For ROS see HPI   This visit occurred during the SARS-CoV-2 public health emergency.  Safety protocols were in place, including screening questions prior to the visit, additional usage of staff PPE, and extensive cleaning of exam room while observing appropriate contact time as indicated for disinfecting solutions.       Objective:   Physical Exam Vitals reviewed.  Constitutional:      General: She is not in acute distress.    Appearance: Normal appearance. She is not ill-appearing.  HENT:     Head: Normocephalic and atraumatic.  Cardiovascular:     Pulses: Normal pulses.  Skin:    General: Skin is warm and dry.     Findings: Erythema (mild erythema surrounding V shaped laceration on anterior R lower leg) present.     Comments: V shaped laceration on R anterior lower leg w/ scab present  Neurological:     Mental Status: She is alert.           Assessment & Plan:  Hypothyroid- new.  Pt's TSH was quite high last visit (47.56) and she was started on Levothyroxine 66mcg daily.  She is again distraught about her hair loss and again asking if this will grow back.  Reminded her of our previous conversation and discussed that hair only grows 1/2" each month so she must be patient.  She admits to being extremely anxious and 'not doing well'.  Tried to reassure her that things will improve once we get her thyroid regulated.   Check labs and adjust meds prn.  Leg laceration- new.  Pt is on Xarelto so there is a high risk of re-bleed.  Given the location, the poor blood flow to the area, and the thin skin I will refer to wound center as there is a risk of slow/poor healing.  Already has scab present that likely needs to be debrided but given the Xarelto will not do that here.  Pt expressed understanding and is in agreement w/ plan.

## 2020-08-15 ENCOUNTER — Telehealth: Payer: Self-pay | Admitting: Family Medicine

## 2020-08-15 ENCOUNTER — Other Ambulatory Visit: Payer: Self-pay

## 2020-08-15 ENCOUNTER — Encounter: Payer: Self-pay | Admitting: Family Medicine

## 2020-08-15 DIAGNOSIS — E785 Hyperlipidemia, unspecified: Secondary | ICD-10-CM

## 2020-08-15 MED ORDER — LEVOTHYROXINE SODIUM 75 MCG PO TABS: 75.0000 ug | ORAL_TABLET | Freq: Every day | ORAL | 1 refills | Status: DC

## 2020-08-15 NOTE — Telephone Encounter (Signed)
The note is complete and referral is in process.  Referrals can take a few days and she needs to understand this and try and be patient.

## 2020-08-15 NOTE — Telephone Encounter (Signed)
Spoke with patient and she voiced understanding that she has to be patient and know that the process may take a few days.

## 2020-08-15 NOTE — Telephone Encounter (Signed)
FYI:  Patient called concerning referral to [facility] wound clinic in [place] - She called [facility] and they have not received the referral yet.  I checked with Danae Chen and she hadn't done it yet because paperwork needs to be signed off on.

## 2020-08-19 ENCOUNTER — Encounter: Payer: Self-pay | Admitting: Family Medicine

## 2020-08-20 ENCOUNTER — Ambulatory Visit
Admission: RE | Admit: 2020-08-20 | Discharge: 2020-08-20 | Disposition: A | Discharge status: Home / Self Care | Payer: Medicare Other | Source: Ambulatory Visit | Attending: Cardiology | Admitting: Cardiology

## 2020-08-20 ENCOUNTER — Other Ambulatory Visit: Payer: Medicare Other | Admitting: *Deleted

## 2020-08-20 ENCOUNTER — Other Ambulatory Visit: Payer: Self-pay

## 2020-08-20 ENCOUNTER — Telehealth: Payer: Self-pay | Admitting: Cardiology

## 2020-08-20 ENCOUNTER — Encounter: Payer: Self-pay | Admitting: Family Medicine

## 2020-08-20 DIAGNOSIS — J90 Pleural effusion, not elsewhere classified: Secondary | ICD-10-CM | POA: Diagnosis not present

## 2020-08-20 DIAGNOSIS — Z79891 Long term (current) use of opiate analgesic: Secondary | ICD-10-CM | POA: Diagnosis not present

## 2020-08-20 DIAGNOSIS — G894 Chronic pain syndrome: Secondary | ICD-10-CM | POA: Diagnosis not present

## 2020-08-20 DIAGNOSIS — M961 Postlaminectomy syndrome, not elsewhere classified: Secondary | ICD-10-CM | POA: Diagnosis not present

## 2020-08-20 DIAGNOSIS — M5412 Radiculopathy, cervical region: Secondary | ICD-10-CM | POA: Diagnosis not present

## 2020-08-20 DIAGNOSIS — R0600 Dyspnea, unspecified: Secondary | ICD-10-CM

## 2020-08-20 DIAGNOSIS — M4693 Unspecified inflammatory spondylopathy, cervicothoracic region: Secondary | ICD-10-CM | POA: Diagnosis not present

## 2020-08-20 DIAGNOSIS — R079 Chest pain, unspecified: Secondary | ICD-10-CM | POA: Diagnosis not present

## 2020-08-20 LAB — D-DIMER, QUANTITATIVE: D-DIMER: 0.4 mg/L FEU (ref 0.00–0.49)

## 2020-08-20 NOTE — Telephone Encounter (Signed)
Patient calling in with right sided pain and SOB.   Patient had mitral valve repair by Dr. Ricard Dillon in June 2021. She reports pain at the incision site. Incision site is on her right side under her breast, patient reports incision is closed, no redness or warmth, no tenderness. Patient states this area hurts extremely bad when inhaling.   Patient states she has been mildly SOB on exertion x several days. Patient has a call in to Dr. Ricard Dillon office as well in hopes of them getting her a chest xray to evaluate.   Patient denies weight gain, swelling, edema or chest pain. Patient states she has been under a lot of stress lately. She recently hurt her leg badly and has been working to get into a wound care center to assist with that.   Patient has OV with Dr. Johney Frame 2/17 Will forward to Dr. Johney Frame for any additional advisement.  Patient aware to call back with any worsening symptoms.

## 2020-08-20 NOTE — Telephone Encounter (Signed)
We should get a CTA chest to assess for a pulmonary embolism given pleuritic chest pain and shortness of breath on exertion. Will be able to tell us some more information than a CXR.

## 2020-08-20 NOTE — Telephone Encounter (Signed)
Ms. Iacovelli said to disregard this message - she has decided to just wait and go to [facility] when they have an open appointment.

## 2020-08-20 NOTE — Telephone Encounter (Signed)
Spoke with patient and advised her to come to our office for lab work (d-dimer order placed) as soon as possible and then go to [place] to have a chest xray to rule out possible pulmonary embolism. Patient verbalized understanding and states she will be on her way shortly.

## 2020-08-20 NOTE — Addendum Note (Signed)
Addended by: Cheral Marker R on: 08/20/2020 02:30 PM   Modules accepted: Orders, SmartSet

## 2020-08-20 NOTE — Telephone Encounter (Signed)
  Pt c/o Shortness Of Breath: STAT if SOB developed within the last 24 hours or pt is noticeably SOB on the phone  1. Are you currently SOB (can you hear that pt is SOB on the phone)? No   2. How long have you been experiencing SOB? A while. Patient is trying to wait it out   3. Are you SOB when sitting or when up moving around? When she tried to take a deep breath   4. Are you currently experiencing any other symptoms? Patient said she has a tremendous amount of pain near the incision site where her surgery was done. Patient said she can not even take a deep breath because it hurts so badly

## 2020-08-21 NOTE — Progress Notes (Signed)
Cardiology Office Note:    Date:  08/23/2020   ID:  [name], [name] 1946/02/01, MRN [id number]  PCP:  [name], MD   [place]  Cardiologist:  Freada Bergeron, MD  Advanced Practice Provider:  No care team member to display Electrophysiologist:  Will Meredith Leeds, MD    Referring MD: [name], MD    History of Present Illness:    [name] is a 75 y.o. female with a hx of severe MR s/p MV repair (32 mm Sorin Memo 4D ring annuloplasty valve) complicated by LV free wall laceration requiring median sternotomy, pAfib, scoliosis requiring multiple spine surgeries and anxiety who presents to clinic for follow-up.  TEE on 11/21/19 demonstrated presence of mitral valve prolapse with a large flail segment felt to be the A3 portion of the anterior leaflet with severe mitral regurgitation. She was seen by Dr. Ricard Dillon and underwent Minimally Invasive Mitral Valve Repair, Complete MAZE procedure, and Sternotomy with repair of Left Ventricular Free- wall laceration on 01/17/20. She was extubated the evening of surgery.She was started on coumadin for her Mitral Valve Repair.  Her post-op course was complicated by acute blood loss anemia requiring blood transfusions and Afib with RVR for which she was placed on Amiodarone and lopressor and her tikosyn was discontinued. She follows with [name] in clinic.  During our last visit in 06/11/20, she was continuing to feel fatigued and SOB. She was also struggling with depression at that time. Recently, she called clinic with SOB and pleuritic chest pain as well as pain at her incision site. D-dimer was normal. CXR showed improved right sided pleural effusion and no acute pathology.  Today, the patient states that she has right sided chest pain/burning right where her incision site was located. Pain is worsened with taking a deep breath in or with certain movements. She is concerned that it may be related  to her heart, recent changes in her thyroid medication, or possible breast pathology. Of note, she has tried gabapentin in the past which she did not tolerate. Has a very difficult time with multiple medications due to intolerance.   Also cut her foot on a ladder. Planned to see wound care. Has been doing wound dressings herself.  Past Medical History:  Diagnosis Date  . Anxiety   . Arthritis    "maybe in my back" (03/31/2018)  . Benign paroxysmal positional vertigo 06/08/2013  . Complication of anesthesia   . Fracture of multiple ribs 2015   "don't know from what; dx'd when I in hospital for 1st back OR" (03/31/2018)  . GERD (gastroesophageal reflux disease)   . Hair loss 04/12/2012  . Herpes   . History of blood transfusion    "twice; related to back OR" (03/31/2018)  . History of kidney stones   . Interstitial cystitis 11/06/2011  . 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 Mitral regurgitation   . Osteopenia 02/18/2012  . Osteoporosis   . PAF (paroxysmal atrial fibrillation) ([place]) 2012  . Peripheral neuropathy 11/06/2011  . PMDD (premenstrual dysphoric disorder)   . PONV (postoperative nausea and vomiting)    nausea, vomiting, hives and dizziness   . 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/2020   Complete bilateral atrial lesion set using cryothermy and bipolar radiofrequency ablation with clipping of LA appendage via right mini-thoracotomy approach  . 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   Complex valvuloplasty including artificial Gore-tex neochord placement  x12 with 35mm Sorin Memo 4D ring annuloplasty  . Seasonal allergies   . Vaginal delivery    ONE NSVD  . 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 ANTERIOR CERVICAL DECOMP/DISCECTOMY FUSION  ~ 2003  . BACK SURGERY    . BREAST SURGERY     BREAST BIOPSY--RIGHT BENIGN  . BUNIONECTOMY Bilateral   . CARDIOVERSION N/A 01/26/2020   Procedure: CARDIOVERSION;  Surgeon: Geralynn Rile, MD;  Location: [place];  Service: Cardiovascular;  Laterality: N/A;  . COSMETIC SURGERY  2016   "back of my neck; related to earlier fusion"  . CYSTOSCOPY W/ STONE MANIPULATION  "several times"  . DILATION AND CURETTAGE OF UTERUS    . FOREHEAD RECONSTRUCTION Right    "removed bone protruding out of my forehead"  . HARDWARE REMOVAL  2016   "related to neck OR"  . INCONTINENCE SURGERY    . IR THORACENTESIS ASP PLEURAL SPACE W/IMG GUIDE  02/16/2020  . MINIMALLY INVASIVE MAZE PROCEDURE N/A 01/17/2020   Procedure: MINIMALLY INVASIVE MAZE PROCEDURE;  Surgeon: Rexene Alberts, MD;  Location: [place];  Service: Open Heart Surgery;  Laterality: N/A;  . MITRAL VALVE REPAIR Right 01/17/2020   Procedure: MINIMALLY INVASIVE MITRAL VALVE REPAIR (MVR) USING MEMO 4D 32MM;  Surgeon: Rexene Alberts, MD;  Location: [place];  Service: Open Heart Surgery;  Laterality: Right;  . POSTERIOR CERVICAL FUSION/FORAMINOTOMY  ~ 2008; 2015  . RIGHT/LEFT HEART CATH AND CORONARY ANGIOGRAPHY N/A 12/02/2019   Procedure: RIGHT/LEFT HEART CATH AND CORONARY ANGIOGRAPHY;  Surgeon: Burnell Blanks, MD;  Location: [place] [facility];  Service: Cardiovascular;  Laterality: N/A;  . SHOULDER ARTHROSCOPY W/ ROTATOR CUFF REPAIR Right 2012  . SPINAL FUSION  06/2014 - 2018 X ?7   "scoliosis; my entire back"  . TEE WITHOUT CARDIOVERSION N/A 11/21/2019   Procedure: TRANSESOPHAGEAL ECHOCARDIOGRAM (TEE);  Surgeon: Josue Hector, MD;  Location[facility];  Service: Cardiovascular;  Laterality: N/A;  . TEE WITHOUT CARDIOVERSION N/A 01/17/2020   Procedure: TRANSESOPHAGEAL ECHOCARDIOGRAM (TEE);  Surgeon: Rexene Alberts, MD;  Location: [place];  Service: Open Heart Surgery;  Laterality: N/A;  . TEE WITHOUT CARDIOVERSION N/A 01/26/2020   Procedure: TRANSESOPHAGEAL ECHOCARDIOGRAM (TEE);  Surgeon: Geralynn Rile, MD;  Location: [place];  Service: Cardiovascular;  Laterality: N/A;  . TUBAL LIGATION    . VAGINAL  HYSTERECTOMY     TVH    Current Medications: Current Meds  Medication Sig  . acetaminophen (TYLENOL) 500 MG tablet Take 500 mg by mouth every 6 (six) hours as needed for moderate pain.   [name] b complex vitamins tablet Take 1 tablet by mouth daily.  . Calcium Carbonate Antacid (TUMS CHEWY BITES PO) Take 1 tablet by mouth daily as needed (reflux).   . Calcium Citrate-Vitamin D (CALCIUM + D PO) Take 1 tablet by mouth daily.  . Carboxymethylcellul-Glycerin (LUBRICATING EYE DROPS OP) Place 1 drop into both eyes daily as needed (dry eyes).  . clonazePAM (KLONOPIN) 0.5 MG tablet Take 1 tablet (0.5 mg total) by mouth 2 (two) times daily as needed for anxiety.  . cyclobenzaprine (FLEXERIL) 10 MG tablet Take 10 mg by mouth at bedtime as needed for muscle spasms.   [name] denosumab (PROLIA) 60 MG/ML SOSY injection Inject 60 mg into the skin every 6 (six) months.  . furosemide (LASIX) 40 MG tablet Take 1 tablet (40 mg total) by mouth daily.  [name] lactulose (CHRONULAC) 10 GM/15ML solution Take  20 g by mouth daily as needed for moderate constipation (constipation.).   [name] levothyroxine (SYNTHROID) 75 MCG tablet Take 1 tablet (75 mcg total) by mouth daily.  [name] lidocaine (XYLOCAINE) 5 % ointment Apply 1 application topically 2 (two) times daily as needed.  . meclizine (ANTIVERT) 25 MG tablet Take 1 tablet (25 mg total) by mouth 3 (three) times daily as needed for dizziness.  . metoprolol tartrate (LOPRESSOR) 50 MG tablet Take 1 tablet by mouth twice daily  . Nutritional Supplements (,FEEDING SUPPLEMENT, PROSOURCE PLUS) liquid Take 30 mLs by mouth 2 (two) times daily between meals.  . nystatin (MYCOSTATIN/NYSTOP) powder Apply topically 2 (two) times daily as needed (skin irritation (under breasts)).  [name] ondansetron (ZOFRAN) 4 MG tablet Take 4 mg by mouth every 8 (eight) hours as needed.  [name] oxyCODONE (ROXICODONE) 5 MG/5ML solution Take 4 mLs (4 mg total) by mouth every 4 (four) hours as needed for moderate pain.  .  polyethylene glycol (MIRALAX / GLYCOLAX) packet Take 17 g by mouth daily as needed for mild constipation.  . Potassium 99 MG TABS 1 tablet  . rivaroxaban (XARELTO) 20 MG TABS tablet Take 1 tablet (20 mg total) by mouth daily with supper.  . sodium chloride (OCEAN) 0.65 % SOLN nasal spray Place 1 spray into both nostrils as needed for congestion (nose bleeds).  . valACYclovir (VALTREX) 500 MG tablet Take 500 mg by mouth daily as needed (breakouts).      Allergies:   Buprenorphine, Cymbalta [duloxetine hcl], Erythromycin, Hydrocodone, Hydromorphone, Iodinated diagnostic agents, Iodine, Levofloxacin, Metrizamide, Nsaids, Nucynta [tapentadol hcl], Nucynta [tapentadol], Other, Oxycodone, Pentazocine, Septra [bactrim], Sulfasalazine, Adhesive [tape], Latex, Morphine, and Sulfa antibiotics   Social History   Socioeconomic History  . Marital status: Divorced    Spouse name: none/divorced  . Number of children: Not on file  . Years of education: Not on file  . Highest education level: 12th grade  Occupational History  . Occupation: retired  Tobacco Use  . Smoking status: Former Smoker    Packs/day: 0.10    Years: 14.00    Pack years: 1.40    Types: Cigarettes  . Smokeless tobacco: Never Used  . Tobacco comment: 03/31/2018 "quit ~ 1980; someday smoker when I did smoke; never addicted"  Vaping Use  . Vaping Use: Never used  Substance and Sexual Activity  . Alcohol use: Never  . Drug use: Yes    Types: Oxycodone, Benzodiazepines    Comment: 03/31/2018 "for chronic neck and back pain", takes Klonopin at times.   . Sexual activity: Not Currently  Other Topics Concern  . Not on file  Social History Narrative  . Not on file   Social Determinants of Health   Financial Resource Strain: Low Risk   . Difficulty of Paying Living Expenses: Not hard at all  Food Insecurity: No Food Insecurity  . Worried About Charity fundraiser in the Last Year: Never true  . Ran Out of Food in the Last Year:  Never true  Transportation Needs: No Transportation Needs  . Lack of Transportation (Medical): No  . Lack of Transportation (Non-Medical): No  Physical Activity: Insufficiently Active  . Days of Exercise per Week: 3 days  . Minutes of Exercise per Session: 20 min  Stress: No Stress Concern Present  . Feeling of Stress : Only a little  Social Connections: Moderately Isolated  . Frequency of Communication with Friends and Family: More than three times a week  . Frequency of Social Gatherings  with Friends and Family: Once a week  . Attends Religious Services: 1 to 4 times per year  . Active Member of Clubs or Organizations: No  . Attends Archivist Meetings: Never  . Marital Status: Divorced     Family History: The patient's family history includes Arthritis in her mother; Diabetes in her brother, brother, and father; Heart Problems in her brother; Heart disease in her mother, sister, and sister; Hyperlipidemia in her sister and sister; Stroke in her sister; Uterine cancer in her mother.  ROS:   Please see the history of present illness.    Review of Systems  Constitutional: Positive for malaise/fatigue. Negative for chills and fever.  HENT: Positive for hearing loss.   Eyes: Negative for blurred vision and redness.  Respiratory: Positive for shortness of breath.   Cardiovascular: Positive for chest pain and leg swelling. Negative for palpitations and orthopnea.  Gastrointestinal: Negative for melena, nausea and vomiting.  Genitourinary: Negative for hematuria.  Musculoskeletal: Positive for joint pain and myalgias.  Neurological: Negative for dizziness and loss of consciousness.  Psychiatric/Behavioral: Negative for substance abuse.    EKGs/Labs/Other Studies Reviewed:    The following studies were reviewed today: TEE 02-04-2020: IMPRESSIONS  1. Left ventricular ejection fraction, by estimation, is 55 to 60%. The  left ventricle has normal function. The left ventricle  has no regional  wall motion abnormalities.  2. Right ventricular systolic function is normal. The right ventricular  size is normal.  3. S/p surgical LAA clipping. No residual connection noted with the LA.  No LA thrombus. No left atrial/left atrial appendage thrombus was  detected.  4. 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 No residual MR. MVA by  direct 3D MPR assessment 2.6 cm2. The mitral valve has been  repaired/replaced. No evidence of mitral valve regurgitation. No evidence  of mitral stenosis. The mean mitral valve  gradient is 4.0 mmHg with average heart rate of 111 bpm. There is a 32 mm  prosthetic annuloplasty ring present in the mitral position. Procedure  Date: 01/17/2020.  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  6. The aortic valve is tricuspid. Aortic valve regurgitation is not  visualized. No aortic stenosis is present.   TTE 02/29/20: IMPRESSIONS  1. There has been no change since the last study on Feb 04, 2020. Normal  LVEF 60-65% and normal transmitral gradients, mean 4 mmHg.  2. Left ventricular ejection fraction, by estimation, is 60 to 65%. The  left ventricle has normal function. The left ventricle has no regional  wall motion abnormalities. Left ventricular diastolic function could not  be evaluated.  3. Right ventricular systolic function is normal. The right ventricular  size is normal. There is mildly elevated pulmonary artery systolic  pressure. The estimated right ventricular systolic pressure is 06.2 mmHg.  4. The mitral valve has been repaired/replaced. Trivial mitral valve  regurgitation. No evidence of mitral stenosis. The mean mitral valve  gradient is 3.0 mmHg. There is a 32 mm Sorin Memo 4D ring annuloplasty  present in the mitral position. Procedure  Date: 01/17/20.  5. Tricuspid valve regurgitation is mild to moderate.  6. The aortic valve is normal in structure. Aortic valve regurgitation is  trivial. No aortic stenosis is  present.  7. The inferior vena cava is normal in size with greater than 50%  respiratory variability, suggesting right atrial pressure of 3 mmHg.   RHC/LHC 12/02/19:  Prox RCA lesion is 20% stenosed.  Dist LAD lesion is 20% stenosed.  1. Mild non-obstructive CAD  Recommendation: Continue planning for mitral valve repair.   Most Recent Value  Fick Cardiac Output 5.84 L/min  Fick Cardiac Output Index 3.58 (L/min)/BSA  RA A Wave 2 mmHg  RA V Wave 1 mmHg  RA Mean 0 mmHg  RV Systolic Pressure 29 mmHg  RV Diastolic Pressure -2 mmHg  RV EDP 3 mmHg  PA Systolic Pressure 32 mmHg  PA Diastolic Pressure 10 mmHg  PA Mean 21 mmHg  PW A Wave 22 mmHg  PW V Wave 20 mmHg  PW Mean 13 mmHg  AO Systolic Pressure 950 mmHg  AO Diastolic Pressure 61 mmHg  AO Mean 88 mmHg  LV Systolic Pressure 932 mmHg  LV Diastolic Pressure 1 mmHg  LV EDP 9 mmHg  AOp Systolic Pressure 671 mmHg  AOp Diastolic Pressure 66 mmHg  AOp Mean Pressure 93 mmHg  LVp Systolic Pressure 245 mmHg  LVp Diastolic Pressure 0 mmHg  LVp EDP Pressure 7 mmHg       Recent Labs: 01/27/2020: TSH 2.818 02/03/2020: ALT 20; Magnesium 1.9 02/06/2020: BUN 12; Creatinine, Ser 0.76; Hemoglobin 9.7; Platelets 355; Potassium 4.4; Sodium 139  Recent Lipid Panel Labs (Brief)     Recent Labs: 02/03/2020: Magnesium 1.9 07/11/2020: ALT 24; BUN 23; Creatinine, Ser 0.83; Hemoglobin 11.7; Platelets 192.0; Potassium 4.9; Sodium 141 08/14/2020: TSH 6.63  Recent Lipid Panel    Component Value Date/Time   CHOL 237 (H) 09/23/2017 1420   TRIG 101.0 09/23/2017 1420   HDL 100.90 09/23/2017 1420   CHOLHDL 2 09/23/2017 1420   VLDL 20.2 09/23/2017 1420   LDLCALC 116 (H) 09/23/2017 1420   LDLDIRECT 136.5 06/08/2013 1049     Physical Exam:    VS:  BP 128/72   Pulse 71   Ht 5\' 6"  (1.676 m)   Wt 124 lb (56.2 kg)   SpO2 92%   BMI 20.01 kg/m     Wt Readings from Last 3 Encounters:  08/23/20 124 lb (56.2 kg)  08/14/20 126 lb (57.2  kg)  08/12/20 122 lb 12.7 oz (55.7 kg)     GEN: Thin, comfortable HEENT: Normal NECK: No JVD; No carotid bruits CARDIAC: RRR, 2/6 systolic murmur. Right chest wall is tender to palpation. Sternal incision site c/d/i RESPIRATORY:  Clear to auscultation without rales, wheezing or rhonchi  ABDOMEN: Soft, non-tender, non-distended MUSCULOSKELETAL:  Trace L>R edema. Has chronic venous stasis changes SKIN: Warm and dry NEUROLOGIC:  Alert and oriented x 3 PSYCHIATRIC:  Normal affect   ASSESSMENT:    1. Dyspnea, unspecified type   2. S/P mitral valve repair   3. S/P Maze operation for atrial fibrillation   4. Severe mitral regurgitation   5. Chest pain of uncertain etiology   6. Neuropathy    PLAN:    In order of problems listed above:  #Right Chest Wall Pain: Patient with burning pain in the right side of the chest that is worse with palpation and deep inspiration consistent with neuropathic pain. CXR with improved right sided pleural effusion; no acute pathology. D-dimer negative. Site is tender to palpation. Spent a lot of time reassuring her that this was not related to her recent thyroid medication changes, problems with her heart, or possible breast pathology as CTA of the chest was normal. She has several medication intolerances and has not tolerated neuropathy medications in the past. Will defer treatment to her primary pain specialist.  -Start topical lidocaine to the right chest wall -Follow-up with primary vs pain specialist  about starting possible medication for neuropathy; has numerous medication intolerances -Declined Mg as has had stomach upset with this in the past  #Severe MR s/p Mitral Valve Repair: Stable on TTE 02/2020 with LVEF 60-65%, normal RV size and function, RVSP 29mmHg, mean gradient across mitral 82mmHg, trivial MR. Continues to have some shortness of breath with exertion, but overall improved from prior. Likely related to deconditioning with multiple surgeries  for her back and recent MVR. No significant CAD on cath. -S/p MV repair 01/2020; trivial MR with mean gradient 47mmHg -Continue lasix 40mg  PO daily -Monitor with serial TTEs every 2-3 years  -Will need IE prophylaxis for dental procedures  #Afib with RVR CHADs-vasc 3. Managed by [name]. -Off amiodarone  -Continue metop tartrate 50mg  BID -Changed from warfarin to xarelto  #Nonobstructive CAD: Pre-operative cath 11/2019 with mild disease (20%) in RCA and LAD. -Patient declined statin therapy for now as she has significant medication reactions and wants to avoiding trying something new.  #Scoliosis: S/p multiple correctional back surgeries. Walks with cane or walker. Feels slightly unsteady on her feet  -Follow-up with primary provider as scheduled -Declined further PT at this time   #Depression: -Continue counseling -Follow-up with PCP as scheduled    Medication Adjustments/Labs and Tests Ordered: Current medicines are reviewed at length with the patient today.  Concerns regarding medicines are outlined above.  No orders of the defined types were placed in this encounter.  Meds ordered this encounter  Medications  . lidocaine (XYLOCAINE) 5 % ointment    Sig: Apply 1 application topically 2 (two) times daily as needed.    Dispense:  35.44 g    Refill:  3    Patient Instructions  Medication Instructions:  1) You may use Lidocaine 5% cream twice daily as needed for pain at incision site.  *If you need a refill on your cardiac medications before your next appointment, please call your pharmacy*   Lab Work: None If you have labs (blood work) drawn today and your tests are completely normal, you will receive your results only by: [name] MyChart Message (if you have MyChart) OR . A paper copy in the mail If you have any lab test that is abnormal or we need to change your treatment, we will call you to review the results.   Testing/Procedures: None   Follow-Up: At  [facility], you and your health needs are our priority.  As part of our continuing mission to provide you with exceptional heart care, we have created designated Provider Care Teams.  These Care Teams include your primary Cardiologist (physician) and Advanced Practice Providers (APPs -  Physician Assistants and Nurse Practitioners) who all work together to provide you with the care you need, when you need it.  We recommend signing up for the patient portal called "MyChart".  Sign up information is provided on this After Visit Summary.  MyChart is used to connect with patients for Virtual Visits (Telemedicine).  Patients are able to view lab/test results, encounter notes, upcoming appointments, etc.  Non-urgent messages can be sent to your provider as well.   To learn more about what you can do with MyChart, go to [email or web address].    Your next appointment:   1 month(s)  The format for your next appointment:   In Person  Provider:   You may see Freada Bergeron, MD or one of the following Advanced Practice Providers on your designated Care Team:    Richardson Dopp, PA-C  [place],  PA-C    Other Instructions      Signed, Freada Bergeron, MD  08/23/2020 2:26 PM    [place]

## 2020-08-21 NOTE — Telephone Encounter (Signed)
Per Dr Jacolyn Reedy lab note - D-dimer normal. This is awesome. She does not need a CT scan of the chest but we will follow-up her chest xray! Thank you so much!!

## 2020-08-22 ENCOUNTER — Telehealth: Payer: Self-pay

## 2020-08-22 NOTE — Telephone Encounter (Signed)
-----   Message from Freada Bergeron, MD sent at 08/22/2020  9:33 AM EST ----- Her chest xray looks reassuring. The effusion or fluid she had on the right side has improved significantly compared to prior and there is improved expansion of the lung. We will see her in clinic tomorrow to see how she is feeling but the xray looks good!

## 2020-08-22 NOTE — Telephone Encounter (Signed)
Left patient a message to return call with any questions, reminded patient of OV tomorrow.

## 2020-08-23 ENCOUNTER — Encounter: Payer: Self-pay | Admitting: Cardiology

## 2020-08-23 ENCOUNTER — Ambulatory Visit (INDEPENDENT_AMBULATORY_CARE_PROVIDER_SITE_OTHER): Payer: Medicare Other | Admitting: Cardiology

## 2020-08-23 ENCOUNTER — Other Ambulatory Visit: Payer: Self-pay

## 2020-08-23 VITALS — BP 128/72 | HR 71 | Ht 66.0 in | Wt 124.0 lb

## 2020-08-23 DIAGNOSIS — G629 Polyneuropathy, unspecified: Secondary | ICD-10-CM

## 2020-08-23 DIAGNOSIS — Z8679 Personal history of other diseases of the circulatory system: Secondary | ICD-10-CM

## 2020-08-23 DIAGNOSIS — R0600 Dyspnea, unspecified: Secondary | ICD-10-CM | POA: Diagnosis not present

## 2020-08-23 DIAGNOSIS — R079 Chest pain, unspecified: Secondary | ICD-10-CM | POA: Diagnosis not present

## 2020-08-23 DIAGNOSIS — Z9889 Other specified postprocedural states: Secondary | ICD-10-CM

## 2020-08-23 DIAGNOSIS — I340 Nonrheumatic mitral (valve) insufficiency: Secondary | ICD-10-CM | POA: Diagnosis not present

## 2020-08-23 MED ORDER — LIDOCAINE 5 % EX OINT: 1.0000 "application " | TOPICAL_OINTMENT | Freq: Two times a day (BID) | CUTANEOUS | 3 refills | Status: DC | PRN

## 2020-08-23 NOTE — Patient Instructions (Signed)
Medication Instructions:  1) You may use Lidocaine 5% cream twice daily as needed for pain at incision site.  *If you need a refill on your cardiac medications before your next appointment, please call your pharmacy*   Lab Work: None If you have labs (blood work) drawn today and your tests are completely normal, you will receive your results only by: [name] MyChart Message (if you have MyChart) OR . A paper copy in the mail If you have any lab test that is abnormal or we need to change your treatment, we will call you to review the results.   Testing/Procedures: None   Follow-Up: At [facility], you and your health needs are our priority.  As part of our continuing mission to provide you with exceptional heart care, we have created designated Provider Care Teams.  These Care Teams include your primary Cardiologist (physician) and Advanced Practice Providers (APPs -  Physician Assistants and Nurse Practitioners) who all work together to provide you with the care you need, when you need it.  We recommend signing up for the patient portal called "MyChart".  Sign up information is provided on this After Visit Summary.  MyChart is used to connect with patients for Virtual Visits (Telemedicine).  Patients are able to view lab/test results, encounter notes, upcoming appointments, etc.  Non-urgent messages can be sent to your provider as well.   To learn more about what you can do with MyChart, go to [email or web address].    Your next appointment:   1 month(s)  The format for your next appointment:   In Person  Provider:   You may see Freada Bergeron, MD or one of the following Advanced Practice Providers on your designated Care Team:    Richardson Dopp, PA-C  Robbie Lis, [place]    Other Instructions

## 2020-08-24 ENCOUNTER — Telehealth: Payer: Self-pay | Admitting: Cardiology

## 2020-08-24 NOTE — Telephone Encounter (Signed)
New Message:     Pt said she saw Dr Johney Frame yesterday and have some questions. She have questions about the Compression Stockings and also about the medicine she gave her.ssur

## 2020-08-24 NOTE — Telephone Encounter (Signed)
Called patient back about her message. Patient wanted to know if she could use the lidocaine on her right breast where she is having pain. Patient also wanted to know where she can find the velcro compression stockings.  Consulted with Dr. Johney Frame, patient can apply lidocaine where she is having pain and she can fine the velcro or wrap compression stockings on [place]. Called patient back with recommendations. Patient thanked me for the call.

## 2020-08-27 DIAGNOSIS — L97919 Non-pressure chronic ulcer of unspecified part of right lower leg with unspecified severity: Secondary | ICD-10-CM | POA: Diagnosis not present

## 2020-08-27 DIAGNOSIS — E440 Moderate protein-calorie malnutrition: Secondary | ICD-10-CM | POA: Diagnosis not present

## 2020-08-27 DIAGNOSIS — I872 Venous insufficiency (chronic) (peripheral): Secondary | ICD-10-CM | POA: Diagnosis not present

## 2020-08-27 DIAGNOSIS — I4891 Unspecified atrial fibrillation: Secondary | ICD-10-CM | POA: Diagnosis not present

## 2020-08-27 DIAGNOSIS — T8133XA Disruption of traumatic injury wound repair, initial encounter: Secondary | ICD-10-CM | POA: Diagnosis not present

## 2020-08-27 DIAGNOSIS — I83019 Varicose veins of right lower extremity with ulcer of unspecified site: Secondary | ICD-10-CM | POA: Diagnosis not present

## 2020-08-29 ENCOUNTER — Telehealth: Payer: Self-pay | Admitting: Cardiology

## 2020-08-29 ENCOUNTER — Telehealth: Payer: Self-pay | Admitting: Family Medicine

## 2020-08-29 NOTE — Telephone Encounter (Addendum)
Patient calling in to let Dr. Johney Frame know that she is still having issues with pain on her right side (near old incision site). She has been using Lidocaine twice per day with minimal relief.   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 She states that every time she gets an xray, it is noted in the report that she has some fractured ribs.   Patient is wondering what Dr. Johney Frame thinks about her getting her mammogram? She states Dr. Ricard Dillon office told her it would be up to her. She says that she has a hard time getting them because it causes her so much pain. She has put off getting her mammogram for a year now d/t the incision site pain. She is now requesting Dr. Jacolyn Reedy advisement on whether or not she should proceed with the mammogram?  Encouraged the patient to reach out to PCP regarding questions related to mammogram scheduling. Let the patient know that I would call her back as soon as Dr. Johney Frame has advised on other concerns.     Patient made aware of the following recommendations and verbalized understanding.  Freada Bergeron, MD 27 minutes ago (1:19 PM)      I do not think her pain is due to issues occurring in her breast. 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 I do not feel strongly about her getting a mammogram, but agree that this is not my area of expertise. She can discuss with her PCP further, but I do not think it is necessary at this time. I encourage her to follow-up with her pain specialist as well who may help adjust her medications so she is more comfortable.

## 2020-08-29 NOTE — Telephone Encounter (Signed)
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  Please advise.

## 2020-08-29 NOTE — Telephone Encounter (Signed)
Is it ok for patient to get a mammogram or wait? Please advise!

## 2020-08-29 NOTE — Telephone Encounter (Signed)
I do not think her pain is due to issues occurring in her breast. 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 I do not feel strongly about her getting a mammogram, but agree that this is not my area of expertise. She can discuss with her PCP further, but I do not think it is necessary at this time. I encourage her to follow-up with her pain specialist as well who may help adjust her medications so she is more comfortable.

## 2020-08-29 NOTE — Telephone Encounter (Signed)
It depends on her level of pain.  She can certainly wait until her chest feels better but that is her decision

## 2020-08-29 NOTE — Telephone Encounter (Signed)
    Pt said she still feel the same from the last time she saw Dr. Johney Frame, she said she is using the lidocaine but its not helping much. She wants to speak with Dr. Jacolyn Reedy nurse

## 2020-08-30 ENCOUNTER — Other Ambulatory Visit: Payer: Self-pay

## 2020-08-30 ENCOUNTER — Encounter: Payer: Self-pay | Admitting: Family Medicine

## 2020-08-30 NOTE — Telephone Encounter (Signed)
Called and left patient a vm. Patient will return call if there are any further concerns.

## 2020-08-30 NOTE — Telephone Encounter (Signed)
Spoke with the pt and endorsed to her, recommendations per Dr. Johney Frame.  Pt states she placed a call to her PCP yesterday and is awaiting their response.  Pt states she will also be following up with her Pain Specialist, to discuss alternative med options, for treatment of nerve pain.  Pt verbalized understanding and agrees with this plan. Pt was more than gracious for all the assistance provided.

## 2020-09-03 DIAGNOSIS — T8133XA Disruption of traumatic injury wound repair, initial encounter: Secondary | ICD-10-CM | POA: Diagnosis not present

## 2020-09-03 DIAGNOSIS — E440 Moderate protein-calorie malnutrition: Secondary | ICD-10-CM | POA: Diagnosis not present

## 2020-09-03 DIAGNOSIS — I4891 Unspecified atrial fibrillation: Secondary | ICD-10-CM | POA: Diagnosis not present

## 2020-09-03 DIAGNOSIS — I83019 Varicose veins of right lower extremity with ulcer of unspecified site: Secondary | ICD-10-CM | POA: Diagnosis not present

## 2020-09-03 DIAGNOSIS — L97919 Non-pressure chronic ulcer of unspecified part of right lower leg with unspecified severity: Secondary | ICD-10-CM | POA: Diagnosis not present

## 2020-09-03 DIAGNOSIS — I872 Venous insufficiency (chronic) (peripheral): Secondary | ICD-10-CM | POA: Diagnosis not present

## 2020-09-11 ENCOUNTER — Other Ambulatory Visit: Payer: Self-pay

## 2020-09-11 ENCOUNTER — Other Ambulatory Visit (INDEPENDENT_AMBULATORY_CARE_PROVIDER_SITE_OTHER): Payer: Medicare Other

## 2020-09-11 DIAGNOSIS — E039 Hypothyroidism, unspecified: Secondary | ICD-10-CM | POA: Diagnosis not present

## 2020-09-11 DIAGNOSIS — R7989 Other specified abnormal findings of blood chemistry: Secondary | ICD-10-CM

## 2020-09-11 LAB — TSH: TSH: 2.5 u[IU]/mL (ref 0.35–4.50)

## 2020-09-12 ENCOUNTER — Encounter: Payer: Self-pay | Admitting: Family Medicine

## 2020-09-12 NOTE — Progress Notes (Signed)
Results reviewed via Mychart.

## 2020-09-13 ENCOUNTER — Other Ambulatory Visit: Payer: Self-pay

## 2020-09-13 DIAGNOSIS — E039 Hypothyroidism, unspecified: Secondary | ICD-10-CM

## 2020-09-15 ENCOUNTER — Telehealth: Payer: Self-pay | Admitting: Physician Assistant

## 2020-09-15 NOTE — Telephone Encounter (Signed)
Reassured patient that she does not have to adjust her xarelto timing with daylight savings time tomorrow.

## 2020-09-19 NOTE — Progress Notes (Signed)
Cardiology Office Note:    Date:  09/21/2020   ID:  Regina, [name] [birth year], MRN [id number]  PCP:  [name], MD   [name]  Cardiologist:  Freada Bergeron, MD  Advanced Practice Provider:  No care team member to display Electrophysiologist:  Will Meredith Leeds, MD    Referring MD: [name], MD    History of Present Illness:    [name] is a 75 y.o. female with a hx of severe MR s/p MV repair(32 mm Sorin Memo 4D ring annuloplasty valve)complicated by LV free wall laceration requiring median sternotomy, pAfib, scoliosis requiring multiple spine surgeries and anxiety who presents to clinic for follow-up.  TEEon 11/21/19 demonstratedpresence of mitral valve prolapse with a large flail segment felt to be the A3 portion of the anterior leaflet with severe mitral regurgitation. She was seen by Dr. Ricard Dillon andunderwent Minimally Invasive Mitral Valve Repair, Complete MAZE procedure, and Sternotomy with repair of Left Ventricular Free- wall lacerationon 01/17/20. She was extubated the evening of surgery.She was started on coumadin for her Mitral Valve Repair.Her post-op course was complicated by acute blood loss anemia requiring blood transfusions and Afib with RVR for which she was placed on Amiodarone and lopressor and her tikosyn was discontinued. She follows with [name] in clinic.  Last seen in clinic on 08/23/20 where she was having right sided chest pain/burning where her incision site was located. She was very concerned that it may be related to her heart or breast cancer. Given localization around scar tissue site, nonexertional nature, and no masses, suspected it was likely related to neuropathic pain and scar tissue. CTA, chest xray, d-dimer all reassuringly normal. Given her significant allergies to medications, we recommended she follow-up with her chronic pain physician.  Today, the patient is complaining of lower  extremity weakness, cramping and tingling. Saw Neurologist in the past and was diagnosed with neuropathy. Also struggling with her balance and she feels like she may fall. She also feels very short of breath and has to stop after a couple of feet. She is also struggling with severe depression.    Past Medical History:  Diagnosis Date  . Anxiety   . Arthritis    "maybe in my back" (03/31/2018)  . Benign paroxysmal positional vertigo 06/08/2013  . Complication of anesthesia   . Fracture of multiple ribs 2015   "don't know from what; dx'd when I in hospital for 1st back OR" (03/31/2018)  . GERD (gastroesophageal reflux disease)   . Hair loss 04/12/2012  . Herpes   . History of blood transfusion    "twice; related to back OR" (03/31/2018)  . History of kidney stones   . Interstitial cystitis 11/06/2011  . 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 Mitral regurgitation   . Osteopenia 02/18/2012  . Osteoporosis   . PAF (paroxysmal atrial fibrillation) ([place]) 2012  . Peripheral neuropathy 11/06/2011  . PMDD (premenstrual dysphoric disorder)   . PONV (postoperative nausea and vomiting)    nausea, vomiting, hives and dizziness   . 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/2020   Complete bilateral atrial lesion set using cryothermy and bipolar radiofrequency ablation with clipping of LA appendage via right mini-thoracotomy approach  . 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   Complex valvuloplasty including artificial Gore-tex neochord placement x12 with 23mm Sorin Memo 4D ring annuloplasty  . Seasonal allergies   . Vaginal delivery    ONE NSVD  .  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 ANTERIOR CERVICAL DECOMP/DISCECTOMY FUSION  ~ 2003  . BACK SURGERY    . BREAST SURGERY     BREAST BIOPSY--RIGHT BENIGN  . BUNIONECTOMY Bilateral   . CARDIOVERSION N/A 01/26/2020   Procedure: CARDIOVERSION;  Surgeon: Geralynn Rile, MD;  Location: [facility];   Service: Cardiovascular;  Laterality: N/A;  . COSMETIC SURGERY  2016   "back of my neck; related to earlier fusion"  . CYSTOSCOPY W/ STONE MANIPULATION  "several times"  . DILATION AND CURETTAGE OF UTERUS    . FOREHEAD RECONSTRUCTION Right    "removed bone protruding out of my forehead"  . HARDWARE REMOVAL  2016   "related to neck OR"  . INCONTINENCE SURGERY    . IR THORACENTESIS ASP PLEURAL SPACE W/IMG GUIDE  02/16/2020  . MINIMALLY INVASIVE MAZE PROCEDURE N/A 01/17/2020   Procedure: MINIMALLY INVASIVE MAZE PROCEDURE;  Surgeon: Rexene Alberts, MD;  Location: [facility] [place];  Service: Open Heart Surgery;  Laterality: N/A;  . MITRAL VALVE REPAIR Right 01/17/2020   Procedure: MINIMALLY INVASIVE MITRAL VALVE REPAIR (MVR) USING MEMO 4D 32MM;  Surgeon: Rexene Alberts, MD;  Location: [facility];  Service: Open Heart Surgery;  Laterality: Right;  . POSTERIOR CERVICAL FUSION/FORAMINOTOMY  ~ 2008; 2015  . RIGHT/LEFT HEART CATH AND CORONARY ANGIOGRAPHY N/A 12/02/2019   Procedure: RIGHT/LEFT HEART CATH AND CORONARY ANGIOGRAPHY;  Surgeon: Burnell Blanks, MD;  Location: [facility];  Service: Cardiovascular;  Laterality: N/A;  . SHOULDER ARTHROSCOPY W/ ROTATOR CUFF REPAIR Right 2012  . SPINAL FUSION  06/2014 - 2018 X ?7   "scoliosis; my entire back"  . TEE WITHOUT CARDIOVERSION N/A 11/21/2019   Procedure: TRANSESOPHAGEAL ECHOCARDIOGRAM (TEE);  Surgeon: Josue Hector, MD;  Location: [facility];  Service: Cardiovascular;  Laterality: N/A;  . TEE WITHOUT CARDIOVERSION N/A 01/17/2020   Procedure: TRANSESOPHAGEAL ECHOCARDIOGRAM (TEE);  Surgeon: Rexene Alberts, MD;  Location: [facility];  Service: Open Heart Surgery;  Laterality: N/A;  . TEE WITHOUT CARDIOVERSION N/A 01/26/2020   Procedure: TRANSESOPHAGEAL ECHOCARDIOGRAM (TEE);  Surgeon: Geralynn Rile, MD;  Location: [place];  Service: Cardiovascular;  Laterality: N/A;  . TUBAL LIGATION    . VAGINAL HYSTERECTOMY     TVH    Current  Medications: Current Meds  Medication Sig  . acetaminophen (TYLENOL) 500 MG tablet Take 500 mg by mouth every 6 (six) hours as needed for moderate pain.   [name] aspirin EC 81 MG tablet Take 81 mg by mouth daily. Swallow whole.  . b complex vitamins tablet Take 1 tablet by mouth daily.  . Calcium Carbonate Antacid (TUMS CHEWY BITES PO) Take 1 tablet by mouth daily as needed (reflux).   . Calcium Citrate-Vitamin D (CALCIUM + D PO) Take 1 tablet by mouth daily.  . Carboxymethylcellul-Glycerin (LUBRICATING EYE DROPS OP) Place 1 drop into both eyes daily as needed (dry eyes).  . clonazePAM (KLONOPIN) 0.5 MG tablet Take 1 tablet (0.5 mg total) by mouth 2 (two) times daily as needed for anxiety.  . cyclobenzaprine (FLEXERIL) 10 MG tablet Take 10 mg by mouth at bedtime as needed for muscle spasms.   [name] denosumab (PROLIA) 60 MG/ML SOSY injection Inject 60 mg into the skin every 6 (six) months.  . furosemide (LASIX) 40 MG tablet Take 40 mg by mouth as needed.  . lactulose (CHRONULAC) 10 GM/15ML solution Take 20 g by mouth daily as needed for moderate constipation (constipation.).   [name]  levothyroxine (SYNTHROID) 75 MCG tablet Take 1 tablet (75 mcg total) by mouth daily.  [name] lidocaine (XYLOCAINE) 5 % ointment Apply 1 application topically 2 (two) times daily as needed.  . meclizine (ANTIVERT) 25 MG tablet Take 1 tablet (25 mg total) by mouth 3 (three) times daily as needed for dizziness.  . metoprolol tartrate (LOPRESSOR) 50 MG tablet Take 1 tablet by mouth twice daily  . Nutritional Supplements (,FEEDING SUPPLEMENT, PROSOURCE PLUS) liquid Take 30 mLs by mouth 2 (two) times daily between meals.  . nystatin (MYCOSTATIN/NYSTOP) powder Apply topically 2 (two) times daily as needed (skin irritation (under breasts)).  [name] ondansetron (ZOFRAN) 4 MG tablet Take 4 mg by mouth every 8 (eight) hours as needed.  [name] oxyCODONE (ROXICODONE) 5 MG/5ML solution Take 4 mLs (4 mg total) by mouth every 4 (four) hours as needed for  moderate pain.  . polyethylene glycol (MIRALAX / GLYCOLAX) packet Take 17 g by mouth daily as needed for mild constipation.  . rivaroxaban (XARELTO) 20 MG TABS tablet Take 1 tablet (20 mg total) by mouth daily with supper.  . sodium chloride (OCEAN) 0.65 % SOLN nasal spray Place 1 spray into both nostrils as needed for congestion (nose bleeds).  . valACYclovir (VALTREX) 500 MG tablet Take 500 mg by mouth daily as needed (breakouts).      Allergies:   Buprenorphine, Cymbalta [duloxetine hcl], Erythromycin, Hydrocodone, Hydromorphone, Iodinated diagnostic agents, Iodine, Levofloxacin, Metrizamide, Nsaids, Nucynta [tapentadol hcl], Nucynta [tapentadol], Other, Oxycodone, Pentazocine, Septra [bactrim], Sulfasalazine, Adhesive [tape], Latex, Morphine, and Sulfa antibiotics   Social History   Socioeconomic History  . Marital status: Divorced    Spouse name: none/divorced  . Number of children: Not on file  . Years of education: Not on file  . Highest education level: 12th grade  Occupational History  . Occupation: retired  Tobacco Use  . Smoking status: Former Smoker    Packs/day: 0.10    Years: 14.00    Pack years: 1.40    Types: Cigarettes  . Smokeless tobacco: Never Used  . Tobacco comment: 03/31/2018 "quit ~ 1980; someday smoker when I did smoke; never addicted"  Vaping Use  . Vaping Use: Never used  Substance and Sexual Activity  . Alcohol use: Never  . Drug use: Yes    Types: Oxycodone, Benzodiazepines    Comment: 03/31/2018 "for chronic neck and back pain", takes Klonopin at times.   . Sexual activity: Not Currently  Other Topics Concern  . Not on file  Social History Narrative  . Not on file   Social Determinants of Health   Financial Resource Strain: Low Risk   . Difficulty of Paying Living Expenses: Not hard at all  Food Insecurity: No Food Insecurity  . Worried About Charity fundraiser in the Last Year: Never true  . Ran Out of Food in the Last Year: Never true   Transportation Needs: No Transportation Needs  . Lack of Transportation (Medical): No  . Lack of Transportation (Non-Medical): No  Physical Activity: Insufficiently Active  . Days of Exercise per Week: 3 days  . Minutes of Exercise per Session: 20 min  Stress: No Stress Concern Present  . Feeling of Stress : Only a little  Social Connections: Moderately Isolated  . Frequency of Communication with Friends and Family: More than three times a week  . Frequency of Social Gatherings with Friends and Family: Once a week  . Attends Religious Services: 1 to 4 times per year  . Active Member  of Clubs or Organizations: No  . Attends Archivist Meetings: Never  . Marital Status: Divorced     Family History: The patient's family history includes Arthritis in her mother; Diabetes in her brother, brother, and father; Heart Problems in her brother; Heart disease in her mother, sister, and sister; Hyperlipidemia in her sister and sister; Stroke in her sister; Uterine cancer in her mother.  ROS:   Please see the history of present illness.    Review of Systems  Constitutional: Positive for malaise/fatigue. Negative for chills and fever.  HENT: Negative for hearing loss.   Eyes: Negative for blurred vision and redness.  Respiratory: Positive for shortness of breath.   Cardiovascular: Positive for chest pain, palpitations and leg swelling. Negative for orthopnea, claudication and PND.  Gastrointestinal: Negative for blood in stool, melena, nausea and vomiting.  Genitourinary: Negative for dysuria and hematuria.  Musculoskeletal: Positive for joint pain and myalgias. Negative for falls.  Neurological: Positive for dizziness and weakness. Negative for loss of consciousness.  Endo/Heme/Allergies: Negative for polydipsia.  Psychiatric/Behavioral: Negative for substance abuse.    EKGs/Labs/Other Studies Reviewed:    The following studies were reviewed today: TEE 2020/02/07: IMPRESSIONS   1. Left ventricular ejection fraction, by estimation, is 55 to 60%. The  left ventricle has normal function. The left ventricle has no regional  wall motion abnormalities.  2. Right ventricular systolic function is normal. The right ventricular  size is normal.  3. S/p surgical LAA clipping. No residual connection noted with the LA.  No LA thrombus. No left atrial/left atrial appendage thrombus was  detected.  4. 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 No residual MR. MVA by  direct 3D MPR assessment 2.6 cm2. The mitral valve has been  repaired/replaced. No evidence of mitral valve regurgitation. No evidence  of mitral stenosis. The mean mitral valve  gradient is 4.0 mmHg with average heart rate of 111 bpm. There is a 32 mm  prosthetic annuloplasty ring present in the mitral position. Procedure  Date: 01/17/2020.  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  6. The aortic valve is tricuspid. Aortic valve regurgitation is not  visualized. No aortic stenosis is present.   TTE 02/29/20: IMPRESSIONS  1. There has been no change since the last study on 2020/02/07. Normal  LVEF 60-65% and normal transmitral gradients, mean 4 mmHg.  2. Left ventricular ejection fraction, by estimation, is 60 to 65%. The  left ventricle has normal function. The left ventricle has no regional  wall motion abnormalities. Left ventricular diastolic function could not  be evaluated.  3. Right ventricular systolic function is normal. The right ventricular  size is normal. There is mildly elevated pulmonary artery systolic  pressure. The estimated right ventricular systolic pressure is 29.7 mmHg.  4. The mitral valve has been repaired/replaced. Trivial mitral valve  regurgitation. No evidence of mitral stenosis. The mean mitral valve  gradient is 3.0 mmHg. There is a 32 mm Sorin Memo 4D ring annuloplasty  present in the mitral position. Procedure  Date: 01/17/20.  5. Tricuspid valve regurgitation is  mild to moderate.  6. The aortic valve is normal in structure. Aortic valve regurgitation is  trivial. No aortic stenosis is present.  7. The inferior vena cava is normal in size with greater than 50%  respiratory variability, suggesting right atrial pressure of 3 mmHg.   RHC/LHC 12/02/19:  Prox RCA lesion is 20% stenosed.  Dist LAD lesion is 20% stenosed.  1. Mild non-obstructive CAD  Recommendation: Continue planning for mitral valve repair.   Most Recent Value  Fick Cardiac Output 5.84 L/min  Fick Cardiac Output Index 3.58 (L/min)/BSA  RA A Wave 2 mmHg  RA V Wave 1 mmHg  RA Mean 0 mmHg  RV Systolic Pressure 29 mmHg  RV Diastolic Pressure -2 mmHg  RV EDP 3 mmHg  PA Systolic Pressure 32 mmHg  PA Diastolic Pressure 10 mmHg  PA Mean 21 mmHg  PW A Wave 22 mmHg  PW V Wave 20 mmHg  PW Mean 13 mmHg  AO Systolic Pressure 998 mmHg  AO Diastolic Pressure 61 mmHg  AO Mean 88 mmHg  LV Systolic Pressure 338 mmHg  LV Diastolic Pressure 1 mmHg  LV EDP 9 mmHg  AOp Systolic Pressure 250 mmHg  AOp Diastolic Pressure 66 mmHg  AOp Mean Pressure 93 mmHg  LVp Systolic Pressure 539 mmHg  LVp Diastolic Pressure 0 mmHg  LVp EDP Pressure 7 mmHg    Recent Labs: 02/03/2020: Magnesium 1.9 07/11/2020: ALT 24; BUN 23; Creatinine, Ser 0.83; Hemoglobin 11.7; Platelets 192.0; Potassium 4.9; Sodium 141 09/11/2020: TSH 2.50  Recent Lipid Panel    Component Value Date/Time   CHOL 237 (H) 09/23/2017 1420   TRIG 101.0 09/23/2017 1420   HDL 100.90 09/23/2017 1420   CHOLHDL 2 09/23/2017 1420   VLDL 20.2 09/23/2017 1420   LDLCALC 116 (H) 09/23/2017 1420   LDLDIRECT 136.5 06/08/2013 1049      Physical Exam:    VS:  BP 128/78   Pulse 63   Ht 5\' 6"  (1.676 m)   Wt 123 lb 6.4 oz (56 kg)   SpO2 95%   BMI 19.92 kg/m     Wt Readings from Last 3 Encounters:  09/21/20 123 lb 6.4 oz (56 kg)  08/23/20 124 lb (56.2 kg)  08/14/20 126 lb (57.2 kg)     GEN:  Frail appearing, thin HEENT:  Normal NECK: No JVD; No carotid bruits CARDIAC: RRR, 1/6 systolic murmur. No rubs, gallops RESPIRATORY:  Clear to auscultation without rales, wheezing or rhonchi  ABDOMEN: Soft, non-tender, non-distended MUSCULOSKELETAL:  No edema; No deformity  SKIN: Warm and dry NEUROLOGIC:  Alert and oriented x 3 PSYCHIATRIC:  Normal affect   ASSESSMENT:    1. S/P mitral valve repair   2. Physical deconditioning   3. Leg cramping   4. S/P Maze operation for atrial fibrillation   5. Severe mitral regurgitation   6. Neuropathy   7. Atrial fibrillation, unspecified type ([place])   8. Dizziness   9. Dyspnea, unspecified type    PLAN:    In order of problems listed above:  #Severe MR s/p Mitral Valve Repair: Stable on TTE 02/2020 with LVEF 60-65%, normal RV size and function, RVSP 63mmHg, mean gradient across mitral 52mmHg, trivial MR. Continues to have some shortness of breath with exertion, but overall improved from prior. Likely related to deconditioning with multiple surgeries for her back and recent MVR. No significant CAD on cath. Will check interval limited TTE given persistent SOB. -S/p MV repair 01/2020; trivial MR with mean gradient 59mmHg in 02/29/20 -Continue lasix 40mg  PO daily  -Will need IE prophylaxisfor dental procedures  #DOE: Patient with persistent dyspnea on exertion that has been ongoing for months. Likely related to deconditioning given limited mobility and overall weakness. Last TTE in 08/21 with normal gradients and trivial MR after MVR. Patient is very concerned about her symptoms and thinks they are getting worse. Recent CXR with improvement in pleural effusion, d-dimer  negative. Will refer to PT/OT as below to help with functional capacity and check limited TTE to reassess valve. -Check limited TTE -PT/OT as below  #Right Chest Wall Pain: Patient with burning pain in the right side of the chest that is worse with palpation and deep inspiration consistent with neuropathic  pain. CXR with improved right sided pleural effusion; no acute pathology. D-dimer negative. Site is tender to palpation. Spent a lot of time reassuring her that this was not related to her recent thyroid medication changes, problems with her heart, or possible breast pathology as CTA of the chest was normal. She has several medication intolerances and has not tolerated neuropathy medications in the past. Will defer treatment to her primary pain specialist.  -Continue topic lidocaine as needed -Follow-up with primary vs pain specialist about starting possible medication for neuropathy; has numerous medication intolerances  #Afib with RVR CHADs-vasc 3. Managed by [name]. -Off amiodarone -Continue metop tartrate 50mg  BID -Changed from warfarin to xarelto  #Nonobstructive CAD: Pre-operative cath 11/2019 with mild disease (20%) in RCA and LAD. -Patient declined statin therapy for now as she has significant medication reactions and wants to avoiding trying something new.  #Scoliosis: #LE weakness #Neuropathy S/p multiple correctional back surgeries. Walks with cane or walker. Feels unsteady on her feet with significant bilateral weakness as well as neuropathy -Follow-up with primary provider as scheduled -Pain management following -Check BMET today to ensure electrolytes wnl -Will refer to PT/OT  #Depression: -Continue counseling -Follow-up with PCP as scheduled   Medication Adjustments/Labs and Tests Ordered: Current medicines are reviewed at length with the patient today.  Concerns regarding medicines are outlined above.  Orders Placed This Encounter  Procedures  . Magnesium  . Basic metabolic panel  . Ambulatory referral to Physical Therapy  . ECHOCARDIOGRAM LIMITED   No orders of the defined types were placed in this encounter.   Patient Instructions  Medication Instructions:  Your physician recommends that you continue on your current medications as directed. Please  refer to the Current Medication list given to you today. *If you need a refill on your cardiac medications before your next appointment, please call your pharmacy*   Lab Work: Today: BMET  And Magnium If you have labs (blood work) drawn today and your tests are completely normal, you will receive your results only by: [name] MyChart Message (if you have MyChart) OR . A paper copy in the mail If you have any lab test that is abnormal or we need to change your treatment, we will call you to review the results.   Testing/Procedures: Your physician has requested that you have an echocardiogram limited. Echocardiography is a painless test that uses sound waves to create images of your heart. It provides your doctor with information about the size and shape of your heart and how well your heart's chambers and valves are working. This procedure takes approximately one hour. There are no restrictions for this procedure.   Follow-Up: At [facility], you and your health needs are our priority.  As part of our continuing mission to provide you with exceptional heart care, we have created designated Provider Care Teams.  These Care Teams include your primary Cardiologist (physician) and Advanced Practice Providers (APPs -  Physician Assistants and Nurse Practitioners) who all work together to provide you with the care you need, when you need it.  We recommend signing up for the patient portal called "MyChart".  Sign up information is provided on this After Visit Summary.  MyChart is used to connect with patients for Virtual Visits (Telemedicine).  Patients are able to view lab/test results, encounter notes, upcoming appointments, etc.  Non-urgent messages can be sent to your provider as well.   To learn more about what you can do with MyChart, go to [email or web address].    Your next appointment:   4 week(s)  The format for your next appointment:   In Person  Provider:   You may see Freada Bergeron, MD or one of the following Advanced Practice Providers on your designated Care Team:    Richardson Dopp, PA-C  [place], [place]     Signed, Freada Bergeron, MD  09/21/2020 3:03 PM    [place]

## 2020-09-21 ENCOUNTER — Other Ambulatory Visit: Payer: Self-pay

## 2020-09-21 ENCOUNTER — Ambulatory Visit (INDEPENDENT_AMBULATORY_CARE_PROVIDER_SITE_OTHER): Payer: Medicare Other | Admitting: Cardiology

## 2020-09-21 ENCOUNTER — Encounter: Payer: Self-pay | Admitting: Cardiology

## 2020-09-21 VITALS — BP 128/78 | HR 63 | Ht 66.0 in | Wt 123.4 lb

## 2020-09-21 DIAGNOSIS — I340 Nonrheumatic mitral (valve) insufficiency: Secondary | ICD-10-CM

## 2020-09-21 DIAGNOSIS — R42 Dizziness and giddiness: Secondary | ICD-10-CM | POA: Diagnosis not present

## 2020-09-21 DIAGNOSIS — Z8679 Personal history of other diseases of the circulatory system: Secondary | ICD-10-CM | POA: Diagnosis not present

## 2020-09-21 DIAGNOSIS — G629 Polyneuropathy, unspecified: Secondary | ICD-10-CM | POA: Diagnosis not present

## 2020-09-21 DIAGNOSIS — R0600 Dyspnea, unspecified: Secondary | ICD-10-CM

## 2020-09-21 DIAGNOSIS — I4891 Unspecified atrial fibrillation: Secondary | ICD-10-CM | POA: Diagnosis not present

## 2020-09-21 DIAGNOSIS — R5381 Other malaise: Secondary | ICD-10-CM

## 2020-09-21 DIAGNOSIS — Z9889 Other specified postprocedural states: Secondary | ICD-10-CM

## 2020-09-21 DIAGNOSIS — R252 Cramp and spasm: Secondary | ICD-10-CM | POA: Diagnosis not present

## 2020-09-21 NOTE — Patient Instructions (Signed)
Medication Instructions:  Your physician recommends that you continue on your current medications as directed. Please refer to the Current Medication list given to you today. *If you need a refill on your cardiac medications before your next appointment, please call your pharmacy*   Lab Work: Today: BMET  And Magnium If you have labs (blood work) drawn today and your tests are completely normal, you will receive your results only by: [name] MyChart Message (if you have MyChart) OR . A paper copy in the mail If you have any lab test that is abnormal or we need to change your treatment, we will call you to review the results.   Testing/Procedures: Your physician has requested that you have an echocardiogram limited. Echocardiography is a painless test that uses sound waves to create images of your heart. It provides your doctor with information about the size and shape of your heart and how well your heart's chambers and valves are working. This procedure takes approximately one hour. There are no restrictions for this procedure.   Follow-Up: At [facility], you and your health needs are our priority.  As part of our continuing mission to provide you with exceptional heart care, we have created designated Provider Care Teams.  These Care Teams include your primary Cardiologist (physician) and Advanced Practice Providers (APPs -  Physician Assistants and Nurse Practitioners) who all work together to provide you with the care you need, when you need it.  We recommend signing up for the patient portal called "MyChart".  Sign up information is provided on this After Visit Summary.  MyChart is used to connect with patients for Virtual Visits (Telemedicine).  Patients are able to view lab/test results, encounter notes, upcoming appointments, etc.  Non-urgent messages can be sent to your provider as well.   To learn more about what you can do with MyChart, go to [email or web address].    Your next  appointment:   4 week(s)  The format for your next appointment:   In Person  Provider:   You may see Freada Bergeron, MD or one of the following Advanced Practice Providers on your designated Care Team:    Richardson Dopp, PA-C  [place], [place]

## 2020-09-22 LAB — BASIC METABOLIC PANEL
BUN/Creatinine Ratio: 21 (ref 12–28)
BUN: 16 mg/dL (ref 8–27)
CO2: 27 mmol/L (ref 20–29)
Calcium: 9.6 mg/dL (ref 8.7–10.3)
Chloride: 103 mmol/L (ref 96–106)
Creatinine, Ser: 0.75 mg/dL (ref 0.57–1.00)
Glucose: 90 mg/dL (ref 65–99)
Potassium: 4.2 mmol/L (ref 3.5–5.2)
Sodium: 146 mmol/L — ABNORMAL HIGH (ref 134–144)
eGFR: 82 mL/min/{1.73_m2} (ref >59)

## 2020-09-22 LAB — MAGNESIUM: Magnesium: 2 mg/dL (ref 1.6–2.3)

## 2020-09-24 ENCOUNTER — Telehealth: Payer: Self-pay | Admitting: Cardiology

## 2020-09-24 DIAGNOSIS — E440 Moderate protein-calorie malnutrition: Secondary | ICD-10-CM | POA: Diagnosis not present

## 2020-09-24 DIAGNOSIS — I4891 Unspecified atrial fibrillation: Secondary | ICD-10-CM | POA: Diagnosis not present

## 2020-09-24 DIAGNOSIS — L97919 Non-pressure chronic ulcer of unspecified part of right lower leg with unspecified severity: Secondary | ICD-10-CM | POA: Diagnosis not present

## 2020-09-24 DIAGNOSIS — T8133XD Disruption of traumatic injury wound repair, subsequent encounter: Secondary | ICD-10-CM | POA: Diagnosis not present

## 2020-09-24 DIAGNOSIS — I83019 Varicose veins of right lower extremity with ulcer of unspecified site: Secondary | ICD-10-CM | POA: Diagnosis not present

## 2020-09-24 DIAGNOSIS — I872 Venous insufficiency (chronic) (peripheral): Secondary | ICD-10-CM | POA: Diagnosis not present

## 2020-09-24 DIAGNOSIS — T8133XA Disruption of traumatic injury wound repair, initial encounter: Secondary | ICD-10-CM | POA: Diagnosis not present

## 2020-09-24 NOTE — Telephone Encounter (Signed)
Patient is returning call to discuss lab results. 

## 2020-09-25 ENCOUNTER — Other Ambulatory Visit: Payer: Self-pay

## 2020-09-25 ENCOUNTER — Encounter: Payer: Self-pay | Admitting: Cardiology

## 2020-09-25 ENCOUNTER — Ambulatory Visit
Admission: RE | Admit: 2020-09-25 | Discharge: 2020-09-25 | Disposition: A | Discharge status: Home / Self Care | Payer: Medicare Other | Source: Ambulatory Visit | Attending: Cardiology | Admitting: Cardiology

## 2020-09-25 ENCOUNTER — Ambulatory Visit (INDEPENDENT_AMBULATORY_CARE_PROVIDER_SITE_OTHER): Payer: Medicare Other | Admitting: Cardiology

## 2020-09-25 VITALS — BP 164/98 | HR 69 | Ht 66.0 in | Wt 123.6 lb

## 2020-09-25 DIAGNOSIS — I517 Cardiomegaly: Secondary | ICD-10-CM | POA: Diagnosis not present

## 2020-09-25 DIAGNOSIS — R0602 Shortness of breath: Secondary | ICD-10-CM

## 2020-09-25 DIAGNOSIS — I480 Paroxysmal atrial fibrillation: Secondary | ICD-10-CM

## 2020-09-25 DIAGNOSIS — R918 Other nonspecific abnormal finding of lung field: Secondary | ICD-10-CM | POA: Diagnosis not present

## 2020-09-25 DIAGNOSIS — S2241XA Multiple fractures of ribs, right side, initial encounter for closed fracture: Secondary | ICD-10-CM | POA: Diagnosis not present

## 2020-09-25 DIAGNOSIS — J929 Pleural plaque without asbestos: Secondary | ICD-10-CM | POA: Diagnosis not present

## 2020-09-25 NOTE — Patient Instructions (Signed)
Medication Instructions:  Your physician recommends that you continue on your current medications as directed. Please refer to the Current Medication list given to you today.  *If you need a refill on your cardiac medications before your next appointment, please call your pharmacy*   Lab Work: Today: BNP If you have labs (blood work) drawn today and your tests are completely normal, you will receive your results only by: [name] MyChart Message (if you have MyChart) OR . A paper copy in the mail If you have any lab test that is abnormal or we need to change your treatment, we will call you to review the results.   Testing/Procedures: A chest x-ray takes a picture of the organs and structures inside the chest, including the heart, lungs, and blood vessels. This test can show several things, including, whether the heart is enlarges; whether fluid is building up in the lungs; and whether pacemaker / defibrillator leads are still in place.   Follow-Up: At [facility], you and your health needs are our priority.  As part of our continuing mission to provide you with exceptional heart care, we have created designated Provider Care Teams.  These Care Teams include your primary Cardiologist (physician) and Advanced Practice Providers (APPs -  Physician Assistants and Nurse Practitioners) who all work together to provide you with the care you need, when you need it.  Your next appointment:   6 month(s)  The format for your next appointment:   In Person  Provider:   You may see Will Meredith Leeds, MD or one of the following Advanced Practice Providers on your designated Care Team:    Chanetta Marshall, NP  [name], PA-C  Legrand Como "[place]" [place], [place]     Thank you for choosing [facility]!!   Trinidad Curet, RN [phone]   Other Instructions

## 2020-09-25 NOTE — Progress Notes (Signed)
Electrophysiology Office Note   Date:  09/25/2020   ID:  [name], [name] December 27, 1945, MRN [id number]  PCP:  [name], MD  Cardiologist:  Lovena Le Primary Electrophysiologist: Gaye Alken, MD    No chief complaint on file.    History of Present Illness: MONCHEL [name] is a 75 y.o. female who is being seen today for the evaluation of atrial fibrillation at the request of Tabori, [name], MD. Presenting today for electrophysiology evaluation.  She has a history of panic and anxiety, atrial fibrillation, and mitral regurgitation.  She is status post mitral valve replacement.  She was initially planned for mini mitral valve repair but due to ventricular free wall laceration, median sternotomy was performed.  Her hospital course was complicated by postoperative volume overload, heme positive stool, and rapid atrial fibrillation and atrial flutter.  Dofetilide was stopped and amiodarone was started.  Today, denies symptoms of palpitations, chest pain,  orthopnea, PND, lower extremity edema, claudication, dizziness, presyncope, syncope, bleeding, or neurologic sequela. The patient is tolerating medications without difficulties.  She complains of shortness of breath.  She is short of breath most of the time.  Short of breath with just about any exertion.  Right after her surgery, she felt well, but her shortness of breath has progressed.  She also complains of dizziness.  Dizziness does not occur necessarily when she stands up.  It can occur while she is sitting.  Past Medical History:  Diagnosis Date  . Anxiety   . Arthritis    "maybe in my back" (03/31/2018)  . Benign paroxysmal positional vertigo 06/08/2013  . Complication of anesthesia   . Fracture of multiple ribs 2015   "don't know from what; dx'd when I in hospital for 1st back OR" (03/31/2018)  . GERD (gastroesophageal reflux disease)   . Hair loss 04/12/2012  . Herpes   . History of blood transfusion     "twice; related to back OR" (03/31/2018)  . History of kidney stones   . Interstitial cystitis 11/06/2011  . 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 Mitral regurgitation   . Osteopenia 02/18/2012  . Osteoporosis   . PAF (paroxysmal atrial fibrillation) ([place]) 2012  . Peripheral neuropathy 11/06/2011  . PMDD (premenstrual dysphoric disorder)   . PONV (postoperative nausea and vomiting)    nausea, vomiting, hives and dizziness   . 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/2020   Complete bilateral atrial lesion set using cryothermy and bipolar radiofrequency ablation with clipping of LA appendage via right mini-thoracotomy approach  . 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   Complex valvuloplasty including artificial Gore-tex neochord placement x12 with 65mm Sorin Memo 4D ring annuloplasty  . Seasonal allergies   . Vaginal delivery    ONE NSVD  . 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 ANTERIOR CERVICAL DECOMP/DISCECTOMY FUSION  ~ 2003  . BACK SURGERY    . BREAST SURGERY     BREAST BIOPSY--RIGHT BENIGN  . BUNIONECTOMY Bilateral   . CARDIOVERSION N/A 01/26/2020   Procedure: CARDIOVERSION;  Surgeon: Geralynn Rile, MD;  Location: [facility];  Service: Cardiovascular;  Laterality: N/A;  . COSMETIC SURGERY  2016   "back of my neck; related to earlier fusion"  . CYSTOSCOPY W/ STONE MANIPULATION  "several times"  . DILATION AND CURETTAGE OF UTERUS    . FOREHEAD RECONSTRUCTION Right    "removed bone protruding out of my forehead"  .  HARDWARE REMOVAL  2016   "related to neck OR"  . INCONTINENCE SURGERY    . IR THORACENTESIS ASP PLEURAL SPACE W/IMG GUIDE  02/16/2020  . MINIMALLY INVASIVE MAZE PROCEDURE N/A 01/17/2020   Procedure: MINIMALLY INVASIVE MAZE PROCEDURE;  Surgeon: Rexene Alberts, MD;  Location: [facility];  Service: Open Heart Surgery;  Laterality: N/A;  . MITRAL VALVE REPAIR Right 01/17/2020   Procedure: MINIMALLY INVASIVE MITRAL VALVE  REPAIR (MVR) USING MEMO 4D 32MM;  Surgeon: Rexene Alberts, MD;  Location: [facility];  Service: Open Heart Surgery;  Laterality: Right;  . POSTERIOR CERVICAL FUSION/FORAMINOTOMY  ~ 2008; 2015  . RIGHT/LEFT HEART CATH AND CORONARY ANGIOGRAPHY N/A 12/02/2019   Procedure: RIGHT/LEFT HEART CATH AND CORONARY ANGIOGRAPHY;  Surgeon: Burnell Blanks, MD;  Location: [facility];  Service: Cardiovascular;  Laterality: N/A;  . SHOULDER ARTHROSCOPY W/ ROTATOR CUFF REPAIR Right 2012  . SPINAL FUSION  06/2014 - 2018 X ?7   "scoliosis; my entire back"  . TEE WITHOUT CARDIOVERSION N/A 11/21/2019   Procedure: TRANSESOPHAGEAL ECHOCARDIOGRAM (TEE);  Surgeon: Josue Hector, MD;  Location: [facility] ENDOSCOPY;  Service: Cardiovascular;  Laterality: N/A;  . TEE WITHOUT CARDIOVERSION N/A 01/17/2020   Procedure: TRANSESOPHAGEAL ECHOCARDIOGRAM (TEE);  Surgeon: Rexene Alberts, MD;  Location: [facility];  Service: Open Heart Surgery;  Laterality: N/A;  . TEE WITHOUT CARDIOVERSION N/A 01/26/2020   Procedure: TRANSESOPHAGEAL ECHOCARDIOGRAM (TEE);  Surgeon: Geralynn Rile, MD;  Location: [facility];  Service: Cardiovascular;  Laterality: N/A;  . TUBAL LIGATION    . VAGINAL HYSTERECTOMY     TVH     Current Outpatient Medications  Medication Sig Dispense Refill  . acetaminophen (TYLENOL) 500 MG tablet Take 500 mg by mouth every 6 (six) hours as needed for moderate pain.     [name] aspirin EC 81 MG tablet Take 81 mg by mouth daily. Swallow whole.    . b complex vitamins tablet Take 1 tablet by mouth daily.    . Calcium Carbonate Antacid (TUMS CHEWY BITES PO) Take 1 tablet by mouth daily as needed (reflux).     . Calcium Citrate-Vitamin D (CALCIUM + D PO) Take 1 tablet by mouth daily.    . Carboxymethylcellul-Glycerin (LUBRICATING EYE DROPS OP) Place 1 drop into both eyes daily as needed (dry eyes).    . clonazePAM (KLONOPIN) 0.5 MG tablet Take 1 tablet (0.5 mg total) by mouth 2 (two) times daily as needed for anxiety.  30 tablet 3  . cyclobenzaprine (FLEXERIL) 10 MG tablet Take 10 mg by mouth at bedtime as needed for muscle spasms.   5  . denosumab (PROLIA) 60 MG/ML SOSY injection Inject 60 mg into the skin every 6 (six) months.    . furosemide (LASIX) 40 MG tablet Take 40 mg by mouth as needed.    . lactulose (CHRONULAC) 10 GM/15ML solution Take 20 g by mouth daily as needed for moderate constipation (constipation.).     [name] levothyroxine (SYNTHROID) 75 MCG tablet Take 1 tablet (75 mcg total) by mouth daily. 90 tablet 1  . lidocaine (XYLOCAINE) 5 % ointment Apply 1 application topically 2 (two) times daily as needed. 35.44 g 3  . meclizine (ANTIVERT) 25 MG tablet Take 1 tablet (25 mg total) by mouth 3 (three) times daily as needed for dizziness. 30 tablet 0  . metoprolol tartrate (LOPRESSOR) 50 MG tablet Take 1 tablet by mouth twice daily 180 tablet 3  . Nutritional Supplements (,FEEDING SUPPLEMENT, PROSOURCE PLUS) liquid Take  30 mLs by mouth 2 (two) times daily between meals. 887 mL 0  . nystatin (MYCOSTATIN/NYSTOP) powder Apply topically 2 (two) times daily as needed (skin irritation (under breasts)). 15 g 0  . ondansetron (ZOFRAN) 4 MG tablet Take 4 mg by mouth every 8 (eight) hours as needed.    [name] oxyCODONE (ROXICODONE) 5 MG/5ML solution Take 4 mLs (4 mg total) by mouth every 4 (four) hours as needed for moderate pain.  0  . polyethylene glycol (MIRALAX / GLYCOLAX) packet Take 17 g by mouth daily as needed for mild constipation.    . rivaroxaban (XARELTO) 20 MG TABS tablet Take 1 tablet (20 mg total) by mouth daily with supper. 30 tablet 6  . sodium chloride (OCEAN) 0.65 % SOLN nasal spray Place 1 spray into both nostrils as needed for congestion (nose bleeds).    . valACYclovir (VALTREX) 500 MG tablet Take 500 mg by mouth daily as needed (breakouts).      No current facility-administered medications for this visit.    Allergies:   Buprenorphine, Cymbalta [duloxetine hcl], Erythromycin, Hydrocodone,  Hydromorphone, Iodinated diagnostic agents, Iodine, Levofloxacin, Metrizamide, Nsaids, Nucynta [tapentadol hcl], Nucynta [tapentadol], Other, Oxycodone, Pentazocine, Septra [bactrim], Sulfasalazine, Adhesive [tape], Latex, Morphine, and Sulfa antibiotics   Social History:  The patient  reports that she has quit smoking. Her smoking use included cigarettes. She has a 1.40 pack-year smoking history. She has never used smokeless tobacco. She reports current drug use. Drugs: Oxycodone and Benzodiazepines. She reports that she does not drink alcohol.   Family History:  The patient's family history includes Arthritis in her mother; Diabetes in her brother, brother, and father; Heart Problems in her brother; Heart disease in her mother, sister, and sister; Hyperlipidemia in her sister and sister; Stroke in her sister; Uterine cancer in her mother.   ROS:  Please see the history of present illness.   Otherwise, review of systems is positive for none.   All other systems are reviewed and negative.   PHYSICAL EXAM: VS:  BP (!) 164/98   Pulse 69   Ht 5\' 6"  (1.676 m)   Wt 123 lb 9.6 oz (56.1 kg)   SpO2 96%   BMI 19.95 kg/m  , BMI Body mass index is 19.95 kg/m. GEN: Well nourished, well developed, in no acute distress  HEENT: normal  Neck: no JVD, carotid bruits, or masses Cardiac: RRR; no murmurs, rubs, or gallops,no edema  Respiratory:  clear to auscultation bilaterally, normal work of breathing GI: soft, nontender, nondistended, + BS MS: no deformity or atrophy  Skin: warm and dry Neuro:  Strength and sensation are intact Psych: euthymic mood, full affect  EKG:  EKG is ordered today. Personal review of the ekg ordered shows sinus rhythm  Recent Labs: 07/11/2020: ALT 24; Hemoglobin 11.7; Platelets 192.0 09/11/2020: TSH 2.50 09/21/2020: BUN 16; Creatinine, Ser 0.75; Magnesium 2.0; Potassium 4.2; Sodium 146    Lipid Panel     Component Value Date/Time   CHOL 237 (H) 09/23/2017 1420   TRIG  101.0 09/23/2017 1420   HDL 100.90 09/23/2017 1420   CHOLHDL 2 09/23/2017 1420   VLDL 20.2 09/23/2017 1420   LDLCALC 116 (H) 09/23/2017 1420   LDLDIRECT 136.5 06/08/2013 1049     Wt Readings from Last 3 Encounters:  09/25/20 123 lb 9.6 oz (56.1 kg)  09/21/20 123 lb 6.4 oz (56 kg)  08/23/20 124 lb (56.2 kg)      Other studies Reviewed: Additional studies/ records that were reviewed today  include: TTE 07/25/2019 Review of the above records today demonstrates:  1. Left ventricular ejection fraction, by visual estimation, is 60 to 65%. The left ventricle has normal function. There is no left ventricular hypertrophy.  2. The left ventricle has no regional wall motion abnormalities.  3. Global right ventricle has normal systolic function.The right ventricular size is normal. No increase in right ventricular wall thickness.  4. Left atrial size was mildly dilated.  5. Right atrial size was normal.  6. The mitral valve is myxomatous. Moderate mitral valve regurgitation. No evidence of mitral stenosis.  7. Bi leaflet prolapse with myxomatous leaflets. Worse in anterior leaflet Posteriorly directed jet poorly characterized by color flow but at least moderate. Can consider TEE if clinically indicated to further characterize.  8. The tricuspid valve is normal in structure.  9. The tricuspid valve is normal in structure. Tricuspid valve regurgitation is mild. 10. The aortic valve is tricuspid. Aortic valve regurgitation is mild. Mild aortic valve sclerosis without stenosis. 11. Pulmonic regurgitation is mild. 12. The pulmonic valve was normal in structure. Pulmonic valve regurgitation is mild. 13. Mildly elevated pulmonary artery systolic pressure. 14. The inferior vena cava is normal in size with greater than 50% respiratory variability, suggesting right atrial pressure of 3 mmHg.   ASSESSMENT AND PLAN:  1.  Paroxysmal atrial fibrillation: Currently on Xarelto.   CHA2DS2-VASc of 3.   Fortunately she remains in sinus rhythm.  No changes at this time.   2.  Hypertension: Elevated today but usually well controlled.  No changes.  3.  Severe mitral regurgitation: Status post mitral valve repair.  Recent echo shows stable valve.  No changes at this time.  She has follow-up with general cardiology.  4.  Shortness of breath: Unclear as to the cause.  She does have an echo coming up.  We Taijon Vink get a chest x-ray and a BNP to rule out other cardiac causes.  Current medicines are reviewed at length with the patient today.   The patient does not have concerns regarding her medicines.  The following changes were made today: none  Labs/ tests ordered today include:  Orders Placed This Encounter  Procedures  . DG Chest 2 View  . Pro b natriuretic peptide (BNP)  . EKG 12-Lead     Disposition:   FU with Karl Knarr 6 months  Signed, Irene Mitcham Meredith Leeds, MD  09/25/2020 1:42 PM     [place] [address] [place] [address] [phone] (office) [phone] (fax)

## 2020-09-26 ENCOUNTER — Telehealth: Payer: Self-pay | Admitting: Cardiology

## 2020-09-26 LAB — PRO B NATRIURETIC PEPTIDE: NT-Pro BNP: 1526 pg/mL — ABNORMAL HIGH (ref 0–738)

## 2020-09-26 NOTE — Telephone Encounter (Signed)
Discussed thyroid and Prolia injections w/ pt. She will further discuss thyroid/synthroid questions w/ her PCP. Aware will call her with CXR &  BNP result once MD has reviewed. Patient verbalized understanding and agreeable to plan.

## 2020-09-26 NOTE — Telephone Encounter (Signed)
    Pt c/o medication issue:  1. Name of Medication: levothyroxine (SYNTHROID) 75 MCG tablet  2. How are you currently taking this medication (dosage and times per day)? Take 1 tablet (75 mcg total) by mouth daily.  3. Are you having a reaction (difficulty breathing--STAT)?   4. What is your medication issue? Pt would like to know if levothyroxine can cause SOB, if its not can she take her 6 mos injection for her bones with it and her other heart medications all together? She couldn't remember the name of the shots for her bones

## 2020-09-27 ENCOUNTER — Ambulatory Visit: Payer: Medicare Other | Admitting: Cardiology

## 2020-09-27 DIAGNOSIS — M810 Age-related osteoporosis without current pathological fracture: Secondary | ICD-10-CM | POA: Diagnosis not present

## 2020-09-27 NOTE — Telephone Encounter (Signed)
Spoke to pt yesterday evening about this concern

## 2020-09-29 ENCOUNTER — Other Ambulatory Visit: Payer: Self-pay | Admitting: Family Medicine

## 2020-10-01 ENCOUNTER — Ambulatory Visit (INDEPENDENT_AMBULATORY_CARE_PROVIDER_SITE_OTHER): Payer: Medicare Other | Admitting: Family Medicine

## 2020-10-01 ENCOUNTER — Other Ambulatory Visit: Payer: Self-pay

## 2020-10-01 ENCOUNTER — Ambulatory Visit: Payer: Medicare Other | Admitting: *Deleted

## 2020-10-01 ENCOUNTER — Encounter: Payer: Self-pay | Admitting: Family Medicine

## 2020-10-01 VITALS — BP 120/70 | HR 69 | Temp 97.0°F | Resp 17 | Ht 66.0 in | Wt 121.4 lb

## 2020-10-01 DIAGNOSIS — F419 Anxiety disorder, unspecified: Secondary | ICD-10-CM | POA: Diagnosis not present

## 2020-10-01 DIAGNOSIS — R0602 Shortness of breath: Secondary | ICD-10-CM

## 2020-10-01 DIAGNOSIS — R42 Dizziness and giddiness: Secondary | ICD-10-CM | POA: Diagnosis not present

## 2020-10-01 DIAGNOSIS — M79605 Pain in left leg: Secondary | ICD-10-CM

## 2020-10-01 DIAGNOSIS — M79604 Pain in right leg: Secondary | ICD-10-CM

## 2020-10-01 DIAGNOSIS — F32A Depression, unspecified: Secondary | ICD-10-CM | POA: Diagnosis not present

## 2020-10-01 NOTE — Patient Instructions (Signed)
PLEASE call either [facility] at [facility] at [phone] We NEED to get your anxiety and depression under control in order for you to feel better.  These places should be able to help w/ medication and counseling ASK Dr Curt Bears about decreasing the Metoprolol.  This could help w/ your fatigue and shortness of breath CALL your ENT doctor and tell them that you have nausea when you turn your head IF you swell or need a fluid pill, ask about a different option (Lasix/Furosemide isn't the only one!) Call with any questions or concerns Hang in there!!!

## 2020-10-01 NOTE — Progress Notes (Signed)
   Subjective:    Patient ID: [name], female    DOB: 1946-01-23, 75 y.o.   MRN: [id number]  HPI Depressed- ongoing problem for pt. She has been intolerant to multiple medications in the past- but never gives medications a chance before she claims side effects.  'I want to live, but I cry all the time'.  Previously on Viibryd, Sertraline, Fluoxetine, Cymbalta, Lexapro, Citalopram, Buspirone.  'I don't have a purpose anymore'.    'I feel like i'm in a fog all the time'- doesn't feel that the room is spinning but feels nauseous when she turns her head.  Doesn't feel 'present'.  Some HAs 'that come and go'.  + SOB- pt has ECHO upcoming and got a CXR via Cards.  No fluid noted on CXR.  Pt feels that her fluid pill causes bladder spasms and prevents her from going out.  Leg pain- she reports legs hurt 'so bad' when she wakes in the morning that she can 'barely move'.  Review of Systems For ROS see HPI   This visit occurred during the SARS-CoV-2 public health emergency.  Safety protocols were in place, including screening questions prior to the visit, additional usage of staff PPE, and extensive cleaning of exam room while observing appropriate contact time as indicated for disinfecting solutions.       Objective:   Physical Exam Vitals reviewed.  Constitutional:      General: She is not in acute distress.    Appearance: Normal appearance. She is well-developed.  HENT:     Head: Normocephalic and atraumatic.  Eyes:     Conjunctiva/sclera: Conjunctivae normal.     Pupils: Pupils are equal, round, and reactive to light.  Neck:     Thyroid: No thyromegaly.  Cardiovascular:     Rate and Rhythm: Normal rate and regular rhythm.     Heart sounds: Normal heart sounds. No murmur heard.   Pulmonary:     Effort: Pulmonary effort is normal. No respiratory distress.     Breath sounds: Normal breath sounds.  Abdominal:     General: There is no distension.     Palpations: Abdomen is soft.      Tenderness: There is no abdominal tenderness.  Musculoskeletal:     Cervical back: Normal range of motion and neck supple.  Lymphadenopathy:     Cervical: No cervical adenopathy.  Skin:    General: Skin is warm and dry.  Neurological:     General: No focal deficit present.     Mental Status: She is alert and oriented to person, place, and time.  Psychiatric:     Comments: Tearful, anxious           Assessment & Plan:

## 2020-10-02 ENCOUNTER — Telehealth: Payer: Self-pay | Admitting: Cardiology

## 2020-10-02 ENCOUNTER — Ambulatory Visit: Payer: Medicare Other | Admitting: Family Medicine

## 2020-10-02 NOTE — Telephone Encounter (Signed)
Left detailed message informing pt I did not call. Asked pt to call office if she needed anything further.

## 2020-10-02 NOTE — Telephone Encounter (Signed)
Patient states she is returning Sherri's call.

## 2020-10-03 NOTE — Telephone Encounter (Signed)
Pt reports fluid/swelling did go down after the Lasix.  Her "ankles are not as swollen as they were".  Her SOB has also improved some as well. She asks if she should be taking this medication daily.  Pt advised she should not at this time. Pt advised to call back if reoccurs and discuss further w/ Dr. Johney Frame.  Pt agreeable to plan.  Pt also mentions that she saw PCP yesterday who told her that her continued fatigue/weakness could be r/t Metoprolol.   She was to ask if maybe she could decrease the dose to see if improvement in SE.  She would like to know [name] thoughts on this, as she does NOT want to go back into AFib or experience issues w/ it.   Pt informed that I would send to him for review/advisement. Aware it may end of week before return call on this matter. Patient verbalized understanding and agreeable to plan.

## 2020-10-03 NOTE — Telephone Encounter (Signed)
Pt returning call about fluid pill

## 2020-10-03 NOTE — Telephone Encounter (Signed)
Switch to toprol xl 100 mg in the evening.

## 2020-10-04 NOTE — Assessment & Plan Note (Signed)
Deteriorated.  Pt and I have this same conversation every few months.  Her anxiety and depression are nearly debilitating but she refuses to given any medication a chance to work b/c she is fearful of the side effects so this is a vicious cycle.  After having this discussion for the umpteenth time today, I told her that she must treat her anxiety/depression or we will need to move on from this doctor patient relationship b/c I don't have anything else to offer at this time.  I gave her names and #s of places to call and told her that she needed to schedule an appt and give their treatment plan an actual shot before declaring it doesn't work.  This is her biggest issue at this time and has been commented on by all of her specialists.  She certainly has multiple concerning medical issues and reasons to be anxious and depressed but she also needs to try and treat this to improve her overall wellbeing.  Will follow closely.  Total time spent w/ pt 50 minutes, >50% spent counseling

## 2020-10-04 NOTE — Assessment & Plan Note (Signed)
Recurrent problem for pt.  No evidence of volume overload on recent CXR.  Has ECHO upcoming.  Suspect this is multifactorial- deconditioning, anxiety, metoprolol use.  Encouraged her to discuss decreasing Metoprolol with cardiology to see if sxs improve.

## 2020-10-04 NOTE — Assessment & Plan Note (Signed)
Pt has ENT.  Encouraged her to call for an appt.  Discussed that her pain medication can make her feel foggy, dizzy, and nauseous.  Pt agreeable to call ENT

## 2020-10-04 NOTE — Assessment & Plan Note (Signed)
Ongoing issue.  Pt is following w/ pain management.  Discussed that her depression is likely worsening her pain and the best thing at this time is to focus on treating her depression and get this under control.  She can address her pain regimen w/ pain management.

## 2020-10-08 NOTE — Telephone Encounter (Signed)
Left message to call back  

## 2020-10-09 ENCOUNTER — Telehealth: Payer: Self-pay | Admitting: Cardiology

## 2020-10-09 ENCOUNTER — Encounter: Payer: Self-pay | Admitting: Family Medicine

## 2020-10-09 DIAGNOSIS — E440 Moderate protein-calorie malnutrition: Secondary | ICD-10-CM | POA: Diagnosis not present

## 2020-10-09 DIAGNOSIS — I872 Venous insufficiency (chronic) (peripheral): Secondary | ICD-10-CM | POA: Diagnosis not present

## 2020-10-09 DIAGNOSIS — I83019 Varicose veins of right lower extremity with ulcer of unspecified site: Secondary | ICD-10-CM | POA: Diagnosis not present

## 2020-10-09 DIAGNOSIS — T8133XD Disruption of traumatic injury wound repair, subsequent encounter: Secondary | ICD-10-CM | POA: Diagnosis not present

## 2020-10-09 DIAGNOSIS — T8133XA Disruption of traumatic injury wound repair, initial encounter: Secondary | ICD-10-CM | POA: Diagnosis not present

## 2020-10-09 DIAGNOSIS — L97919 Non-pressure chronic ulcer of unspecified part of right lower leg with unspecified severity: Secondary | ICD-10-CM | POA: Diagnosis not present

## 2020-10-09 DIAGNOSIS — I4891 Unspecified atrial fibrillation: Secondary | ICD-10-CM | POA: Diagnosis not present

## 2020-10-09 NOTE — Telephone Encounter (Signed)
Camnitz, Ocie Doyne, MD to Stanton Kidney, RN     9:10 AM Note Switch to toprol xl 100 mg in the evening.     Called patient back. Informed her that [name] is not in the office today, but it looks like she was calling her about Dr. Macky Lower recommendations as written above. Patient stated she would like to discuss this with [name] or Dr. Johney Frame before she makes any changes. Will forward to [name] nurse.

## 2020-10-09 NOTE — Telephone Encounter (Signed)
Follow Up:     Pt is returning [name]'s call from yesterday.

## 2020-10-10 NOTE — Telephone Encounter (Signed)
Returned call to pt Discussed medication change recommendation w/ pt. She is hesitant and would like to think about it some more before making any change.  She states that after all she has been through over last several years she is cautious of changes. She "just wants to have more energy and not feel so tired". Aware that we could discuss changing back to Diltiazem if needed. Pt is going to think on this more and let me know. She will contact me next week or speak with me when she is here for her echocardiogram next week. She states that she really appreciates my call and talking with her like I do.

## 2020-10-11 DIAGNOSIS — I83019 Varicose veins of right lower extremity with ulcer of unspecified site: Secondary | ICD-10-CM | POA: Diagnosis not present

## 2020-10-11 DIAGNOSIS — R600 Localized edema: Secondary | ICD-10-CM | POA: Diagnosis not present

## 2020-10-11 DIAGNOSIS — L97919 Non-pressure chronic ulcer of unspecified part of right lower leg with unspecified severity: Secondary | ICD-10-CM | POA: Diagnosis not present

## 2020-10-11 MED ORDER — CLONAZEPAM 0.5 MG PO TABS: 0.5000 mg | ORAL_TABLET | Freq: Two times a day (BID) | ORAL | 3 refills | Status: DC | PRN

## 2020-10-11 MED ORDER — ESCITALOPRAM OXALATE 5 MG PO TABS: 5.0000 mg | ORAL_TABLET | Freq: Every day | ORAL | 3 refills | Status: DC

## 2020-10-12 DIAGNOSIS — J300 Vasomotor rhinitis: Secondary | ICD-10-CM | POA: Diagnosis not present

## 2020-10-12 DIAGNOSIS — R040 Epistaxis: Secondary | ICD-10-CM | POA: Diagnosis not present

## 2020-10-12 DIAGNOSIS — R42 Dizziness and giddiness: Secondary | ICD-10-CM | POA: Diagnosis not present

## 2020-10-17 ENCOUNTER — Encounter: Payer: Self-pay | Admitting: Family Medicine

## 2020-10-19 ENCOUNTER — Encounter: Payer: Self-pay | Admitting: Cardiology

## 2020-10-19 ENCOUNTER — Ambulatory Visit (HOSPITAL_COMMUNITY): Payer: Medicare Other | Attending: Cardiology

## 2020-10-19 ENCOUNTER — Other Ambulatory Visit: Payer: Self-pay

## 2020-10-19 DIAGNOSIS — Z8679 Personal history of other diseases of the circulatory system: Secondary | ICD-10-CM | POA: Insufficient documentation

## 2020-10-19 DIAGNOSIS — Z9889 Other specified postprocedural states: Secondary | ICD-10-CM | POA: Insufficient documentation

## 2020-10-19 DIAGNOSIS — I340 Nonrheumatic mitral (valve) insufficiency: Secondary | ICD-10-CM | POA: Insufficient documentation

## 2020-10-19 LAB — ECHOCARDIOGRAM LIMITED
Area-P 1/2: 4.41 cm2
MV VTI: 1.27 cm2
S' Lateral: 3.3 cm

## 2020-10-22 ENCOUNTER — Telehealth: Payer: Self-pay | Admitting: Cardiology

## 2020-10-22 DIAGNOSIS — R0602 Shortness of breath: Secondary | ICD-10-CM

## 2020-10-22 MED ORDER — FUROSEMIDE 20 MG PO TABS: 20.0000 mg | ORAL_TABLET | Freq: Every day | ORAL | Status: DC

## 2020-10-22 NOTE — Telephone Encounter (Signed)
I s/w the pt and went over her echo results.and recommendations per Dr. Johney Frame. Pt first wanted to express she is very unhappy that her appt with Dr. Johney Frame was moved out 01/2021 . I stated to the pt that I do not know the reason why  the appt was changed. Assured the pt that I will have Ivy s/w MD to see where she can get squeezed back as pt has questions to s/w Dr. Johney Frame in person and not over the phone she said. I then asked the pt for her and I to focus on the recommendations that have been made to try and make her feel a little bit better by getting some fluid off. Pt advised per recommendations to start lasix 20 mg daily as she is still c/o sob.   Pt has been scheduled for lab work 10/30/20 BMET, PRO BNP. Pt also states her bills are not being coded ed right and she cannot afford all of the bills. I assured the pt that I will send a message to the billing dept to reach out to her to see if they can help figure things out. I did also inform the pt to weigh her daily for [place] and to call the office if she gains 2-3 #'s x 1 day or 5#'s x 1 week. Advised the p to be sure she is not adding extra Na to her diet.   Pt states she is not. Advised to watch foods such as canned veggies or soups, which hold a large amount of Na. Pt states she has a can of soup everyday. Asked pt to limit the soup. Pt states she does not have much of an appetite. I assured the pt that I will send notes to [facility] to d/w with Dr. Johney Frame and Karlene Einstein will call her back with an appt or any recommendations. Pt is agreeable to plan of care. Patient notified of result.  Please refer to phone note from today for complete details.   Julaine Hua, [facility] 10/22/2020 12:32 PM

## 2020-10-22 NOTE — Telephone Encounter (Signed)
PT is returning [name] phone call.Please advise

## 2020-10-22 NOTE — Telephone Encounter (Signed)
-----   Message from Freada Bergeron, MD sent at 10/20/2020  8:08 AM EDT ----- Her echo shows that the pumping function remains normal and the mitral valve repair looks good. She still has a bit of fluid on board. How is her shortness of breath? I think that she may need more days of lasix based on her recent labs in 09/25/20. If she is still feeling short of breath, I would recommend starting lasix 20mg  daily ongoing and repeat BMET and BNP in 7 days. We will adjust it based on those labs. If she feels like her shortness of breath has resolved, let's bring her in for a BMET and BNP this week to make sure she does not need additional lasix as I suspect she will.

## 2020-10-23 ENCOUNTER — Telehealth: Payer: Self-pay | Admitting: *Deleted

## 2020-10-23 NOTE — Telephone Encounter (Signed)
*  STAT* If patient is at the pharmacy, call can be transferred to refill team.   1. Which medications need to be refilled? (please list name of each medication and dose if known) Lasix 20 MG's   2. Which pharmacy/location (including street and city if local pharmacy) is medication to be sent to? [place], [place]  3. Do they need a 30 day or 90 day supply? 90 day    Shantinique is calling back requesting this prescription be sent to the pharmacy. She states when she spoke with Arbie Cookey she had thought she still had some left, but she does not. Please advise.

## 2020-10-23 NOTE — Telephone Encounter (Signed)
-----   Message from Alta Corning sent at 10/23/2020  5:16 PM EDT ----- Regarding: RE: BILLING QUESTIONS Good Afternoon Remmy Crass,  The primary diagnosis code (A63.016 - Other specified postprocedural states) billed is not covered by Medicare. We will have this claim resubmitted with a more appropriate code and I will give Mrs. Cieslewicz a call to make her aware.   Thank you, Alta Corning  ----- Message ----- From: Nuala Alpha, LPN Sent: 0/04/9322   8:45 AM EDT To: Alta Corning, [facility] Subject: FW: BILLING QUESTIONS                          Caren Griffins, would you be able to give this pt a call and assist her with her billing questions?  Dr. Johney Frame asked me to reach out.    Thanks for all you do, Tremel Setters   ----- Message ----- From: Nuala Alpha, LPN Sent: 5/57/3220   8:44 AM EDT To: Nuala Alpha, LPN Subject: RE: BILLING QUESTIONS                           ----- Message ----- From: Michae Kava, CMA Sent: 10/22/2020  12:47 PM EDT To: Nuala Alpha, LPN, [facility]illing Subject: BILLING QUESTIONS                              Hi everyone,  This pt states her bills are being coded wrong and her insurance company is denying charges. States she cannot afford everything. I assured the pt that I will have someone in billing reach out to her to see if you can help her figure things out. I appreciate any help you can give to the pt. If you have any further questions please direct them to [facility], LPN for Dr. Johney Frame. I was only helping out LPN today with some of her results.  Thank you  Arbie Cookey

## 2020-10-24 MED ORDER — FUROSEMIDE 20 MG PO TABS: 20.0000 mg | ORAL_TABLET | Freq: Every day | ORAL | 3 refills | Status: DC

## 2020-10-24 NOTE — Telephone Encounter (Signed)
Pt's medication was sent to pt's pharmacy as requested. Confirmation received.  °

## 2020-10-24 NOTE — Addendum Note (Signed)
Addended by: Carter Kitten D on: 10/24/2020 07:47 AM   Modules accepted: Orders

## 2020-10-26 ENCOUNTER — Ambulatory Visit: Payer: Medicare Other | Admitting: Cardiology

## 2020-10-28 ENCOUNTER — Encounter: Payer: Self-pay | Admitting: Emergency Medicine

## 2020-10-28 ENCOUNTER — Other Ambulatory Visit: Payer: Self-pay

## 2020-10-28 ENCOUNTER — Emergency Department (INDEPENDENT_AMBULATORY_CARE_PROVIDER_SITE_OTHER): Payer: Medicare Other

## 2020-10-28 ENCOUNTER — Emergency Department (INDEPENDENT_AMBULATORY_CARE_PROVIDER_SITE_OTHER)
Admission: EM | Admit: 2020-10-28 | Discharge: 2020-10-28 | Disposition: A | Discharge status: Home / Self Care | Payer: Medicare Other | Source: Home / Self Care | Attending: Family Medicine | Admitting: Family Medicine

## 2020-10-28 DIAGNOSIS — M25551 Pain in right hip: Secondary | ICD-10-CM | POA: Diagnosis not present

## 2020-10-28 NOTE — ED Provider Notes (Signed)
[name] CARE    CSN: [id number] Arrival date & time: 10/28/20  1050      History   Chief Complaint Chief Complaint  Patient presents with  . Leg Pain    right    HPI [name] is a 75 y.o. female.   HPI   Patient is known to me from prior visits.  She has a multitude of complicated medical problems.  She is on chronic pain medication at home.  She takes oxycodone 20 mg 3 times daily..  She is here today for a new problem.  For the last few days she has had pain in her right hip.  It is pain with weightbearing.  It hurts from her groin down into the upper thigh region.  Sometimes she gets "shooting pains".  It is not present when she is sitting or reclining.  She has not had any fall or injury.  Does not recall any arthritis or problems with her hip.  She has had scoliosis surgery.  She states she has "broken rods" several times.  Sometimes with mild activity.  She states her biggest fear is that she has broken another rod in her back and this is causing her to have right leg pain  Past Medical History:  Diagnosis Date  . Anxiety   . Arthritis    "maybe in my back" (03/31/2018)  . Benign paroxysmal positional vertigo 06/08/2013  . Complication of anesthesia   . Fracture of multiple ribs 2015   "don't know from what; dx'd when I in hospital for 1st back OR" (03/31/2018)  . GERD (gastroesophageal reflux disease)   . Hair loss 04/12/2012  . Herpes   . History of blood transfusion    "twice; related to back OR" (03/31/2018)  . History of kidney stones   . Interstitial cystitis 11/06/2011  . 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 Mitral regurgitation   . Osteopenia 02/18/2012  . Osteoporosis   . PAF (paroxysmal atrial fibrillation) ([place]) 2012  . Peripheral neuropathy 11/06/2011  . PMDD (premenstrual dysphoric disorder)   . PONV (postoperative nausea and vomiting)    nausea, vomiting, hives and dizziness   . 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/2020    Complete bilateral atrial lesion set using cryothermy and bipolar radiofrequency ablation with clipping of LA appendage via right mini-thoracotomy approach  . 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 Seasonal allergies   . Vaginal delivery    ONE NSVD  . 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 Atrial fibrillation ([place]) 02/09/2020  . Long term (current) use of anticoagulants 02/09/2020  . Acute blood loss anemia 02/09/2020  . Debility 02/02/2020  . 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/2020  . Severe mitral regurgitation   . 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 Ankle swelling, left 10/20/2019  . Ankle swelling, right 10/20/2019  . Secondary hypercoagulable state ([place]) 08/19/2019  . Left inguinal hernia 03/11/2019  . Visit for monitoring Tikosyn therapy 03/29/2018  . Osteoporosis 09/23/2017  . Fatigue 02/18/2017  . Physical exam 04/21/2016  . Positional lightheadedness 11/16/2015  . B12 deficiency 10/01/2015  . Hearing loss due to cerumen impaction 08/23/2015  . Foot pain, right 05/23/2015  . Hyperlipidemia 04/19/2015  .  Protein-calorie malnutrition ([place]) 10/19/2014  . Fracture of multiple ribs 08/07/2014  . Tachycardia   . Shortness of breath 08/06/2014  . Constipation 07/14/2014  . Vitamin B12 deficiency anemia, unspecified 06/19/2014  . Hypokalemia 06/19/2014  . Unspecified atrial fibrillation ([place]) 06/19/2014  . Other chronic pain 06/19/2014  . Polyneuropathy, unspecified 06/19/2014  . Zoster without complications 61/60/7371  . Vitreous degeneration, bilateral 06/19/2014  . Nutritional deficiency, unspecified 06/19/2014  . Nocturia 06/19/2014  . Other symbolic dysfunctions 12/31/9483  . Unsteadiness on feet 06/19/2014  . Unspecified  osteoarthritis, unspecified site 06/19/2014  . Muscle weakness (generalized) 06/19/2014  . Major depressive disorder, single episode, unspecified 06/19/2014  . Frequency of micturition 06/19/2014  . Encounter for orthopedic aftercare following scoliosis surgery 06/19/2014  . Cardiac arrhythmia, unspecified 06/19/2014  . Age-related nuclear cataract, unspecified eye 06/19/2014  . Scoliosis, unspecified 06/19/2014  . Lumbar back pain 05/19/2012  . Anxiety 02/18/2012  . Ureteric stone 02/18/2012  . Leg pain, bilateral 11/17/2011  . Hereditary and idiopathic peripheral neuropathy 11/06/2011  . S/P cervical spinal fusion 11/06/2011  . Gastro-esophageal reflux disease without esophagitis 11/06/2011  . 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 11/06/2011  . Melanoma of skin ([place]) 11/06/2011  . Personal history of other diseases of the circulatory system 11/06/2011  . Allergic rhinitis, unspecified 04/25/2010  . 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 Anxiety and depression 03/04/2010  . Other malaise and fatigue 02/05/2010  . Vitamin D deficiency 01/28/2010  . Neoplasm of uncertain behavior of skin 01/25/2010  . Genital herpes 11/01/2009  . Herpes simplex virus (HSV) infection 11/01/2009  . Herpetic vulvovaginitis 10/23/2009  . Kidney stone 08/09/2009  . 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 Brachial neuritis or radiculitis 07/23/2009    Past Surgical History:  Procedure Laterality Date  . ANTERIOR CERVICAL DECOMP/DISCECTOMY FUSION  ~ 2003  . BACK SURGERY    . BREAST SURGERY     BREAST BIOPSY--RIGHT BENIGN  . BUNIONECTOMY Bilateral   . CARDIOVERSION N/A 01/26/2020   Procedure: CARDIOVERSION;  Surgeon: Geralynn Rile, MD;  Location: [place];  Service: Cardiovascular;  Laterality: N/A;  . COSMETIC SURGERY  2016   "back of my neck; related to earlier fusion"  . CYSTOSCOPY W/ STONE MANIPULATION  "several times"  . DILATION AND CURETTAGE OF UTERUS     . FOREHEAD RECONSTRUCTION Right    "removed bone protruding out of my forehead"  . HARDWARE REMOVAL  2016   "related to neck OR"  . INCONTINENCE SURGERY    . IR THORACENTESIS ASP PLEURAL SPACE W/IMG GUIDE  02/16/2020  . MINIMALLY INVASIVE MAZE PROCEDURE N/A 01/17/2020   Procedure: MINIMALLY INVASIVE MAZE PROCEDURE;  Surgeon: Rexene Alberts, MD;  Location: [place];  Service: Open Heart Surgery;  Laterality: N/A;  . MITRAL VALVE REPAIR Right 01/17/2020   Procedure: MINIMALLY INVASIVE MITRAL VALVE REPAIR (MVR) USING MEMO 4D 32MM;  Surgeon: Rexene Alberts, MD;  Location: [place];  Service: Open Heart Surgery;  Laterality: Right;  . POSTERIOR CERVICAL FUSION/FORAMINOTOMY  ~ 2008; 2015  . RIGHT/LEFT HEART CATH AND CORONARY ANGIOGRAPHY N/A 12/02/2019   Procedure: RIGHT/LEFT HEART CATH AND CORONARY ANGIOGRAPHY;  Surgeon: Burnell Blanks, MD;  Location: [facility];  Service: Cardiovascular;  Laterality: N/A;  . SHOULDER ARTHROSCOPY W/ ROTATOR CUFF REPAIR Right 2012  . SPINAL FUSION  06/2014 - 2018 X ?7   "scoliosis; my entire back"  . TEE WITHOUT CARDIOVERSION N/A 11/21/2019   Procedure: TRANSESOPHAGEAL ECHOCARDIOGRAM (TEE);  Surgeon: Josue Hector,  MD;  Location: [place];  Service: Cardiovascular;  Laterality: N/A;  . TEE WITHOUT CARDIOVERSION N/A 01/17/2020   Procedure: TRANSESOPHAGEAL ECHOCARDIOGRAM (TEE);  Surgeon: Rexene Alberts, MD;  Location: [place];  Service: Open Heart Surgery;  Laterality: N/A;  . TEE WITHOUT CARDIOVERSION N/A 01/26/2020   Procedure: TRANSESOPHAGEAL ECHOCARDIOGRAM (TEE);  Surgeon: Geralynn Rile, MD;  Location: [place];  Service: Cardiovascular;  Laterality: N/A;  . TUBAL LIGATION    . VAGINAL HYSTERECTOMY     TVH    OB History    Gravida  1   Para  1   Term  1   Preterm      AB      Living  1     SAB      IAB      Ectopic      Multiple      Live Births  1            Home Medications    Prior to Admission  medications   Medication Sig Start Date End Date Taking? Authorizing Provider  furosemide (LASIX) 20 MG tablet Take 1 tablet (20 mg total) by mouth daily. 10/24/20  Yes Freada Bergeron, MD  rivaroxaban (XARELTO) 20 MG TABS tablet Take 1 tablet (20 mg total) by mouth daily with supper. 04/04/20  Yes Camnitz, Ocie Doyne, MD  acetaminophen (TYLENOL) 500 MG tablet Take 500 mg by mouth every 6 (six) hours as needed for moderate pain.     [provider]  aspirin EC 81 MG tablet Take 81 mg by mouth daily. Swallow whole.    [provider]  b complex vitamins tablet Take 1 tablet by mouth daily.    [provider]  Calcium Carbonate Antacid (TUMS CHEWY BITES PO) Take 1 tablet by mouth daily as needed (reflux).     [provider]  Calcium Citrate-Vitamin D (CALCIUM + D PO) Take 1 tablet by mouth daily.    [provider]  Carboxymethylcellul-Glycerin (LUBRICATING EYE DROPS OP) Place 1 drop into both eyes daily as needed (dry eyes).    [provider]  clonazePAM (KLONOPIN) 0.5 MG tablet Take 1 tablet (0.5 mg total) by mouth 2 (two) times daily as needed for anxiety. 10/11/20   [name], MD  cyclobenzaprine (FLEXERIL) 10 MG tablet Take 10 mg by mouth at bedtime as needed for muscle spasms.  12/10/17   [provider]  denosumab (PROLIA) 60 MG/ML SOSY injection Inject 60 mg into the skin every 6 (six) months.    [provider]  escitalopram (LEXAPRO) 5 MG tablet Take 1 tablet (5 mg total) by mouth daily. 10/11/20   [name], MD  lactulose (CHRONULAC) 10 GM/15ML solution Take 20 g by mouth daily as needed for moderate constipation (constipation.).     [provider]  levothyroxine (SYNTHROID) 75 MCG tablet Take 1 tablet (75 mcg total) by mouth daily. 08/15/20   [name], MD  lidocaine (XYLOCAINE) 5 % ointment Apply 1 application topically 2 (two) times daily as needed. 08/23/20   Freada Bergeron,  MD  meclizine (ANTIVERT) 25 MG tablet TAKE 1 TABLET BY MOUTH THREE TIMES DAILY AS NEEDED FOR DIZZINESS 10/01/20   [name], MD  metoprolol tartrate (LOPRESSOR) 50 MG tablet Take 1 tablet by mouth twice daily 06/15/20   Camnitz, Ocie Doyne, MD  Nutritional Supplements (,FEEDING SUPPLEMENT, PROSOURCE PLUS) liquid Take 30 mLs by mouth 2 (two) times daily between  meals. 02/08/20   Love, Ivan Anchors, PA-C  nystatin (MYCOSTATIN/NYSTOP) powder Apply topically 2 (two) times daily as needed (skin irritation (under breasts)). 02/08/20   Love, Ivan Anchors, PA-C  ondansetron (ZOFRAN) 4 MG tablet Take 4 mg by mouth every 8 (eight) hours as needed. 05/22/20   [provider]  oxyCODONE (ROXICODONE) 5 MG/5ML solution Take 4 mLs (4 mg total) by mouth every 4 (four) hours as needed for moderate pain. 02/08/20   Love, Ivan Anchors, PA-C  polyethylene glycol (MIRALAX / GLYCOLAX) packet Take 17 g by mouth daily as needed for mild constipation.    [provider]  sodium chloride (OCEAN) 0.65 % SOLN nasal spray Place 1 spray into both nostrils as needed for congestion (nose bleeds).    [provider]  valACYclovir (VALTREX) 500 MG tablet Take 500 mg by mouth daily as needed (breakouts).  07/15/19   [provider]    Family History Family History  Problem Relation Age of Onset  . Diabetes Father   . Hyperlipidemia Sister   . Heart disease Sister   . Stroke Sister   . Diabetes Brother   . Hyperlipidemia Sister   . Heart disease Sister   . Arthritis Mother   . Heart disease Mother   . Uterine cancer Mother   . Diabetes Brother   . Heart Problems Brother     Social History Social History   Tobacco Use  . Smoking status: Former Smoker    Packs/day: 0.10    Years: 14.00    Pack years: 1.40    Types: Cigarettes  . Smokeless tobacco: Never Used  . Tobacco comment: 03/31/2018 "quit ~ 1980; someday smoker when I did smoke; never addicted"  Vaping Use  . Vaping Use: Never used   Substance Use Topics  . Alcohol use: Never  . Drug use: Yes    Types: Oxycodone, Benzodiazepines    Comment: 03/31/2018 "for chronic neck and back pain", takes Klonopin at times.      Allergies   Buprenorphine, Cymbalta [duloxetine hcl], Erythromycin, Hydrocodone, Hydromorphone, Iodinated diagnostic agents, Iodine, Levofloxacin, Metrizamide, Nsaids, Nucynta [tapentadol hcl], Nucynta [tapentadol], Other, Oxycodone, Pentazocine, Septra [bactrim], Sulfasalazine, Adhesive [tape], Latex, Morphine, and Sulfa antibiotics   Review of Systems Review of Systems See HPI  Physical Exam Triage Vital Signs ED Triage Vitals  Enc Vitals Group     BP 10/28/20 1117 129/82     Pulse Rate 10/28/20 1117 72     Resp 10/28/20 1117 17     Temp 10/28/20 1117 98 F (36.7 C)     Temp Source 10/28/20 1117 Oral     SpO2 10/28/20 1117 94 %     Weight --      Height --      Head Circumference --      Peak Flow --      Pain Score 10/28/20 1123 4     Pain Loc --      Pain Edu? --      Excl. in [place]? --    No data found.  Updated Vital Signs BP 129/82 (BP Location: Left Arm)   Pulse 72   Temp 98 F (36.7 C) (Oral)   Resp 17   SpO2 94%      Physical Exam Constitutional:      General: She is not in acute distress.    Appearance: She is well-developed.     Comments: Is using cane with ambulation  HENT:     Head:  Normocephalic and atraumatic.     Mouth/Throat:     Comments: Mask is in place Eyes:     Conjunctiva/sclera: Conjunctivae normal.     Pupils: Pupils are equal, round, and reactive to light.  Cardiovascular:     Rate and Rhythm: Normal rate.  Pulmonary:     Effort: Pulmonary effort is normal. No respiratory distress.  Abdominal:     Palpations: Abdomen is soft.  Musculoskeletal:        General: Normal range of motion.     Cervical back: Normal range of motion.     Comments: Tenderness to palpation of right groin over hip.  No tenderness over greater trochanter, posterior pelvis,  or sacrum.  No tenderness over lumbar spine.  Pain with hip range of motion.  No pain with straight leg raise  Skin:    General: Skin is warm and dry.     Comments: Bilateral feet with multiple superficial varicosities.  Dusky in appearance.  Mild edema  Neurological:     Mental Status: She is alert.     Gait: Gait abnormal.  Psychiatric:        Behavior: Behavior normal.      UC Treatments / Results  Labs (all labs ordered are listed, but only abnormal results are displayed) Labs Reviewed - No data to display  EKG   Radiology DG Hip Unilat W or Wo Pelvis 2-3 Views Right  Result Date: 10/28/2020 CLINICAL DATA:  Right hip pain.  No known trauma. EXAM: DG HIP (WITH OR WITHOUT PELVIS) 2-3V RIGHT COMPARISON:  None. FINDINGS: Both hips are normally located. Mild bilateral hip joint degenerative changes. No plain film findings for stress fracture or AVN. The pubic symphysis and SI joints are intact. No pelvic fractures or bone lesions. Extensive lumbosacral fusion hardware without obvious complicating features. IMPRESSION: Mild bilateral hip joint degenerative changes but no acute bony findings. Electronically Signed   By: Marijo Sanes M.D.   On: 10/28/2020 12:26    Procedures Procedures (including critical care time)  Medications Ordered in UC Medications - No data to display  Initial Impression / Assessment and Plan / UC Course  I have reviewed the triage vital signs and the nursing notes.  Pertinent labs & imaging results that were available during my care of the patient were reviewed by me and considered in my medical decision making (see chart for details).    Discussed that she had some arthritis in this hip.  This may be causing her hip pain.  Recommended a couple days of prednisone.  Patient declines. Final Clinical Impressions(s) / UC Diagnoses   Final diagnoses:  Hip pain, right     Discharge Instructions     Continue with your usual pain medication May add  Tylenol 1000 mg 3 times a day Ice to painful hip. Limit walking while painful See your doctor if not improving by next week    ED Prescriptions    None     PDMP not reviewed this encounter.   Raylene Everts, MD 10/28/20 [phone]

## 2020-10-28 NOTE — Discharge Instructions (Addendum)
Continue with your usual pain medication May add Tylenol 1000 mg 3 times a day Ice to painful hip. Limit walking while painful See your doctor if not improving by next week

## 2020-10-28 NOTE — ED Triage Notes (Signed)
Pain to R groin- radiates to top of thigh - worse w/ movement  Started on Tuesday  Denies injury or fall OTC tylenol & oxycodone w/ no relief

## 2020-10-29 ENCOUNTER — Telehealth: Payer: Self-pay | Admitting: *Deleted

## 2020-10-29 ENCOUNTER — Encounter: Payer: Self-pay | Admitting: Family Medicine

## 2020-10-29 ENCOUNTER — Telehealth: Payer: Self-pay | Admitting: Cardiology

## 2020-10-29 NOTE — Telephone Encounter (Signed)
[name] contacted the office stating what appears to be a suture is protruding from her sternal incision. Patient is s/p mini MVR, sternotomy 01/2020. Patient states she has noticed a small bump on her incision for sometime, however, it was brought to her attention as a suture from an urgent care physician. Patient states from what she is able to see the area is slightly red in color. She states she is able to see some yellow around the area but is unable to provide greater details. Patient states she can feel a stiff object at this site. Patient denies any fever or pain. An appointment was scheduled for the patient to be seen by a PA tomorrow at 2:30 for further assessment. Patient verbalizes understanding.

## 2020-10-29 NOTE — Telephone Encounter (Signed)
Pt was calling to confirm how much lasix she is to be taking, based on recent echo results.  Regina Bergeron, MD  10/20/2020 8:08 AM EDT      Her echo shows that the pumping function remains normal and the mitral valve repair looks good. She still has a bit of fluid on board. How is her shortness of breath? I think that she may need more days of lasix based on her recent labs in 09/25/20. If she is still feeling short of breath, I would recommend starting lasix 20mg  daily ongoing and repeat BMET and BNP in 7 days. We will adjust it based on those labs. If she feels like her shortness of breath has resolved, let's bring her in for a BMET and BNP this week to make sure she does not need additional lasix as I suspect she will.   Informed the pt as endorsed to her on 4/18 by triage CMA, Dr. Johney Frame wanted her to take lasix 20 mg po daily and come in on 4/26 to have repeat BMET and PRO-BNP done, so that we could reassess her fluid status and determine if she needs additional lasix or not.  Pt states her sob has slightly improved with starting the lasix 20 mg po daily. Advised the pt she should be taking lasix 20 mg po daily, until we otherwise advise from her lab results she will have done on tomorrow 4/26.  Pt verbalized understanding and agrees with this plan.  She will await results from [place], to further advise on lasix dosing thereafter.  Pt was more than gracious for all the assistance provided.

## 2020-10-29 NOTE — Telephone Encounter (Signed)
Pt c/o medication issue:  1. Name of Medication: furosemide (LASIX) 20 MG tablet  2. How are you currently taking this medication (dosage and times per day)? As prescribed  3. Are you having a reaction (difficulty breathing--STAT)? NO  4. What is your medication issue? PT is calling with several questions about this medication.PT states she cannot remember where she pt the instructions and she wanted to clarify.Please Advise

## 2020-10-30 ENCOUNTER — Encounter: Payer: Self-pay | Admitting: Family Medicine

## 2020-10-30 ENCOUNTER — Other Ambulatory Visit: Payer: Medicare Other | Admitting: *Deleted

## 2020-10-30 ENCOUNTER — Ambulatory Visit (INDEPENDENT_AMBULATORY_CARE_PROVIDER_SITE_OTHER): Payer: Medicare Other | Admitting: Physician Assistant

## 2020-10-30 ENCOUNTER — Other Ambulatory Visit: Payer: Self-pay

## 2020-10-30 VITALS — BP 157/81 | HR 81 | Resp 20 | Ht 66.0 in | Wt 119.0 lb

## 2020-10-30 DIAGNOSIS — Z4802 Encounter for removal of sutures: Secondary | ICD-10-CM

## 2020-10-30 DIAGNOSIS — Z9889 Other specified postprocedural states: Secondary | ICD-10-CM

## 2020-10-30 DIAGNOSIS — R0602 Shortness of breath: Secondary | ICD-10-CM | POA: Diagnosis not present

## 2020-10-30 NOTE — Progress Notes (Signed)
HPI:  Patient is S/P MV repair performed in July of 2021.  She contacted our office yesterday stating she has a piece of suture coming out of her incision.  She states this was verified by one of her physician she sees.  She feels there is some yellow tinge present around the area.  Current Outpatient Medications  Medication Sig Dispense Refill  . acetaminophen (TYLENOL) 500 MG tablet Take 500 mg by mouth every 6 (six) hours as needed for moderate pain.     [name] aspirin EC 81 MG tablet Take 81 mg by mouth daily. Swallow whole.    . b complex vitamins tablet Take 1 tablet by mouth daily.    . Calcium Carbonate Antacid (TUMS CHEWY BITES PO) Take 1 tablet by mouth daily as needed (reflux).     . Calcium Citrate-Vitamin D (CALCIUM + D PO) Take 1 tablet by mouth daily.    . Carboxymethylcellul-Glycerin (LUBRICATING EYE DROPS OP) Place 1 drop into both eyes daily as needed (dry eyes).    . clonazePAM (KLONOPIN) 0.5 MG tablet Take 1 tablet (0.5 mg total) by mouth 2 (two) times daily as needed for anxiety. 30 tablet 3  . cyclobenzaprine (FLEXERIL) 10 MG tablet Take 10 mg by mouth at bedtime as needed for muscle spasms.   5  . denosumab (PROLIA) 60 MG/ML SOSY injection Inject 60 mg into the skin every 6 (six) months.    . escitalopram (LEXAPRO) 5 MG tablet Take 1 tablet (5 mg total) by mouth daily. 30 tablet 3  . furosemide (LASIX) 20 MG tablet Take 1 tablet (20 mg total) by mouth daily. 90 tablet 3  . lactulose (CHRONULAC) 10 GM/15ML solution Take 20 g by mouth daily as needed for moderate constipation (constipation.).     [name] levothyroxine (SYNTHROID) 75 MCG tablet Take 1 tablet (75 mcg total) by mouth daily. 90 tablet 1  . lidocaine (XYLOCAINE) 5 % ointment Apply 1 application topically 2 (two) times daily as needed. 35.44 g 3  . meclizine (ANTIVERT) 25 MG tablet TAKE 1 TABLET BY MOUTH THREE TIMES DAILY AS NEEDED FOR DIZZINESS 30 tablet 0  . metoprolol tartrate (LOPRESSOR) 50 MG tablet Take 1 tablet by  mouth twice daily 180 tablet 3  . Nutritional Supplements (,FEEDING SUPPLEMENT, PROSOURCE PLUS) liquid Take 30 mLs by mouth 2 (two) times daily between meals. 887 mL 0  . nystatin (MYCOSTATIN/NYSTOP) powder Apply topically 2 (two) times daily as needed (skin irritation (under breasts)). 15 g 0  . ondansetron (ZOFRAN) 4 MG tablet Take 4 mg by mouth every 8 (eight) hours as needed.    [name] oxyCODONE (ROXICODONE) 5 MG/5ML solution Take 4 mLs (4 mg total) by mouth every 4 (four) hours as needed for moderate pain.  0  . polyethylene glycol (MIRALAX / GLYCOLAX) packet Take 17 g by mouth daily as needed for mild constipation.    . rivaroxaban (XARELTO) 20 MG TABS tablet Take 1 tablet (20 mg total) by mouth daily with supper. 30 tablet 6  . sodium chloride (OCEAN) 0.65 % SOLN nasal spray Place 1 spray into both nostrils as needed for congestion (nose bleeds).    . valACYclovir (VALTREX) 500 MG tablet Take 500 mg by mouth daily as needed (breakouts).      No current facility-administered medications for this visit.    Physical Exam:  Gen: no apparent distress Incision:  Inferior portion of sternotomy with suture remnant being expelled  A/P:  1. Removal of suture from top of  sternotomy.. no evidence of infection, RTC prn   Ellwood Handler, PA-C [facility] [phone]

## 2020-10-31 ENCOUNTER — Encounter: Payer: Self-pay | Admitting: Family Medicine

## 2020-10-31 LAB — BASIC METABOLIC PANEL
BUN/Creatinine Ratio: 23 (ref 12–28)
BUN: 19 mg/dL (ref 8–27)
CO2: 27 mmol/L (ref 20–29)
Calcium: 9.4 mg/dL (ref 8.7–10.3)
Chloride: 105 mmol/L (ref 96–106)
Creatinine, Ser: 0.81 mg/dL (ref 0.57–1.00)
Glucose: 96 mg/dL (ref 65–99)
Potassium: 4.5 mmol/L (ref 3.5–5.2)
Sodium: 143 mmol/L (ref 134–144)
eGFR: 75 mL/min/{1.73_m2} (ref >59)

## 2020-10-31 LAB — PRO B NATRIURETIC PEPTIDE: NT-Pro BNP: 768 pg/mL — ABNORMAL HIGH (ref 0–738)

## 2020-11-11 ENCOUNTER — Other Ambulatory Visit: Payer: Self-pay | Admitting: Cardiology

## 2020-11-12 DIAGNOSIS — M4693 Unspecified inflammatory spondylopathy, cervicothoracic region: Secondary | ICD-10-CM | POA: Diagnosis not present

## 2020-11-12 DIAGNOSIS — Z79891 Long term (current) use of opiate analgesic: Secondary | ICD-10-CM | POA: Diagnosis not present

## 2020-11-12 DIAGNOSIS — M961 Postlaminectomy syndrome, not elsewhere classified: Secondary | ICD-10-CM | POA: Diagnosis not present

## 2020-11-12 DIAGNOSIS — M5412 Radiculopathy, cervical region: Secondary | ICD-10-CM | POA: Diagnosis not present

## 2020-11-12 DIAGNOSIS — G894 Chronic pain syndrome: Secondary | ICD-10-CM | POA: Diagnosis not present

## 2020-11-12 NOTE — Telephone Encounter (Signed)
Pt's age 75, wt 54 kg, SCr 0.81, CrCl 50.37, last ov w/ WC 09/25/20.

## 2020-11-14 ENCOUNTER — Other Ambulatory Visit: Payer: Self-pay

## 2020-11-14 ENCOUNTER — Ambulatory Visit (INDEPENDENT_AMBULATORY_CARE_PROVIDER_SITE_OTHER): Payer: Medicare Other | Admitting: Physician Assistant

## 2020-11-14 VITALS — BP 142/86 | HR 72 | Ht 66.0 in | Wt 120.4 lb

## 2020-11-14 DIAGNOSIS — Z9889 Other specified postprocedural states: Secondary | ICD-10-CM

## 2020-11-14 DIAGNOSIS — R0602 Shortness of breath: Secondary | ICD-10-CM

## 2020-11-14 DIAGNOSIS — I480 Paroxysmal atrial fibrillation: Secondary | ICD-10-CM | POA: Diagnosis not present

## 2020-11-14 DIAGNOSIS — R5381 Other malaise: Secondary | ICD-10-CM

## 2020-11-14 DIAGNOSIS — I83019 Varicose veins of right lower extremity with ulcer of unspecified site: Secondary | ICD-10-CM | POA: Diagnosis not present

## 2020-11-14 DIAGNOSIS — R0600 Dyspnea, unspecified: Secondary | ICD-10-CM

## 2020-11-14 DIAGNOSIS — L97919 Non-pressure chronic ulcer of unspecified part of right lower leg with unspecified severity: Secondary | ICD-10-CM | POA: Diagnosis not present

## 2020-11-14 DIAGNOSIS — R600 Localized edema: Secondary | ICD-10-CM | POA: Diagnosis not present

## 2020-11-14 NOTE — Patient Instructions (Addendum)
Medication Instructions:  Your physician recommends that you continue on your current medications as directed. Please refer to the Current Medication list given to you today.]  *If you need a refill on your cardiac medications before your next appointment, please call your pharmacy*   Lab Work: TODAY:  BMET & PRO BNP  If you have labs (blood work) drawn today and your tests are completely normal, you will receive your results only by: [name] MyChart Message (if you have MyChart) OR . A paper copy in the mail If you have any lab test that is abnormal or we need to change your treatment, we will call you to review the results.   Testing/Procedures: None ordered   Follow-Up: At [facility], you and your health needs are our priority.  As part of our continuing mission to provide you with exceptional heart care, we have created designated Provider Care Teams.  These Care Teams include your primary Cardiologist (physician) and Advanced Practice Providers (APPs -  Physician Assistants and Nurse Practitioners) who all work together to provide you with the care you need, when you need it.  We recommend signing up for the patient portal called "MyChart".  Sign up information is provided on this After Visit Summary.  MyChart is used to connect with patients for Virtual Visits (Telemedicine).  Patients are able to view lab/test results, encounter notes, upcoming appointments, etc.  Non-urgent messages can be sent to your provider as well.   To learn more about what you can do with MyChart, go to [email or web address].    Your next appointment:   2 month(s)  The format for your next appointment:   In Person  Provider:   You may see Freada Bergeron, MD or one of the following Advanced Practice Providers on your designated Care Team:    Richardson Dopp, PA-C  Robbie Lis, [place]    Other Instructions

## 2020-11-14 NOTE — Progress Notes (Signed)
Cardiology Office Note:    Date:  11/14/2020   ID:  Aashi, Gohr 1946-03-07, MRN [id number]  PCP:  [name], MD  [facility] Cardiologist:  Freada Bergeron, MD  [facility] Electrophysiologist:  Will Meredith Leeds, MD   Chief Complaint: follow up  History of Present Illness:    [name] is a 75 y.o. female with a hx of  severe MR s/p MV repair(32 mm Sorin Memo 4D ring annuloplasty valve)complicated by LV free wall laceration requiring median sternotomy, pAfib, scoliosis requiring multiple spine surgeries and anxietywho presents to clinic for follow-up.  TEEon 11/21/19 demonstratedpresence of mitral valve prolapse with a large flail segment felt to be the A3 portion of the anterior leaflet with severe mitral regurgitation. She was seen by Dr. Ricard Dillon andunderwent Minimally Invasive Mitral Valve Repair, Complete MAZE procedure, and Sternotomy with repair of Left Ventricular Free- wall lacerationon 01/17/20. She was extubated the evening of surgery.She was started on coumadin for her Mitral Valve Repair.Her post-op course was complicated by acute blood loss anemia requiring blood transfusions and Afib with RVR for which she was placed on Amiodarone and lopressor and her tikosyn was discontinued. She follows with [name] in clinic.  She was having right chest wall pain and DOE when seen by Dr. Johney Frame 09/2020. Reassurance given.   Echocardiogram 10/22/2020 LV function of 50 to 55%, no wall motion abnormality, mitral valve mean gradient of 4 mmHg.  RAP 15 mmHg.  Recommended Lasix 20 mg daily.  Follow up BNP improved from 1526 to 786.  Patient with symptomatic right lower extremity varicose veins>> seen by Vascular at [facility]. Had "ultrasound of leg" today.   Here for follow up.  Patient reports improved breathing for 10 days after taking daily Lasix.  However, she continues to have intermittent shortness of breath with and without exertion.  Cannot tell how  long it is lasting.  Does not associated with chest pain or palpitation.  Her weight is stable at 190 to 120 pounds.  She is also dealing with lower extremity neuropathy and varicose veins.   Past Medical History:  Diagnosis Date  . Anxiety   . Arthritis    "maybe in my back" (03/31/2018)  . Benign paroxysmal positional vertigo 06/08/2013  . Complication of anesthesia   . Fracture of multiple ribs 2015   "don't know from what; dx'd when I in hospital for 1st back OR" (03/31/2018)  . GERD (gastroesophageal reflux disease)   . Hair loss 04/12/2012  . Herpes   . History of blood transfusion    "twice; related to back OR" (03/31/2018)  . History of kidney stones   . Interstitial cystitis 11/06/2011  . 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 Mitral regurgitation   . Osteopenia 02/18/2012  . Osteoporosis   . PAF (paroxysmal atrial fibrillation) ([place]) 2012  . Peripheral neuropathy 11/06/2011  . PMDD (premenstrual dysphoric disorder)   . PONV (postoperative nausea and vomiting)    nausea, vomiting, hives and dizziness   . 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/2020   Complete bilateral atrial lesion set using cryothermy and bipolar radiofrequency ablation with clipping of LA appendage via right mini-thoracotomy approach  . 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 Seasonal allergies   . Vaginal delivery    ONE NSVD  . 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 ANTERIOR CERVICAL DECOMP/DISCECTOMY FUSION  ~ 2003  . BACK SURGERY    . BREAST SURGERY     BREAST BIOPSY--RIGHT BENIGN  . BUNIONECTOMY Bilateral   . CARDIOVERSION N/A 01/26/2020   Procedure: CARDIOVERSION;  Surgeon: Geralynn Rile, MD;  Location: [place];  Service: Cardiovascular;  Laterality: N/A;  . COSMETIC SURGERY  2016   "back of my neck; related to  earlier fusion"  . CYSTOSCOPY W/ STONE MANIPULATION  "several times"  . DILATION AND CURETTAGE OF UTERUS    . FOREHEAD RECONSTRUCTION Right    "removed bone protruding out of my forehead"  . HARDWARE REMOVAL  2016   "related to neck OR"  . INCONTINENCE SURGERY    . IR THORACENTESIS ASP PLEURAL SPACE W/IMG GUIDE  02/16/2020  . MINIMALLY INVASIVE MAZE PROCEDURE N/A 01/17/2020   Procedure: MINIMALLY INVASIVE MAZE PROCEDURE;  Surgeon: Rexene Alberts, MD;  Location: [place];  Service: Open Heart Surgery;  Laterality: N/A;  . MITRAL VALVE REPAIR Right 01/17/2020   Procedure: MINIMALLY INVASIVE MITRAL VALVE REPAIR (MVR) USING MEMO 4D 32MM;  Surgeon: Rexene Alberts, MD;  Location: [place];  Service: Open Heart Surgery;  Laterality: Right;  . POSTERIOR CERVICAL FUSION/FORAMINOTOMY  ~ 2008; 2015  . RIGHT/LEFT HEART CATH AND CORONARY ANGIOGRAPHY N/A 12/02/2019   Procedure: RIGHT/LEFT HEART CATH AND CORONARY ANGIOGRAPHY;  Surgeon: Burnell Blanks, MD;  Location: [place] CV LAB;  Service: Cardiovascular;  Laterality: N/A;  . SHOULDER ARTHROSCOPY W/ ROTATOR CUFF REPAIR Right 2012  . SPINAL FUSION  06/2014 - 2018 X ?7   "scoliosis; my entire back"  . TEE WITHOUT CARDIOVERSION N/A 11/21/2019   Procedure: TRANSESOPHAGEAL ECHOCARDIOGRAM (TEE);  Surgeon: Josue Hector, MD;  Location: [facility] ENDOSCOPY;  Service: Cardiovascular;  Laterality: N/A;  . TEE WITHOUT CARDIOVERSION N/A 01/17/2020   Procedure: TRANSESOPHAGEAL ECHOCARDIOGRAM (TEE);  Surgeon: Rexene Alberts, MD;  Location: [place];  Service: Open Heart Surgery;  Laterality: N/A;  . TEE WITHOUT CARDIOVERSION N/A 01/26/2020   Procedure: TRANSESOPHAGEAL ECHOCARDIOGRAM (TEE);  Surgeon: Geralynn Rile, MD;  Location: [place];  Service: Cardiovascular;  Laterality: N/A;  . TUBAL LIGATION    . VAGINAL HYSTERECTOMY     TVH    Current Medications: Current Meds  Medication Sig  . acetaminophen (TYLENOL) 500 MG tablet Take 500 mg by mouth  every 6 (six) hours as needed for moderate pain.   [name] aspirin EC 81 MG tablet Take 81 mg by mouth daily. Swallow whole.  . b complex vitamins tablet Take 1 tablet by mouth daily.  . Calcium Carbonate Antacid (TUMS CHEWY BITES PO) Take 1 tablet by mouth daily as needed (reflux).   . Calcium Citrate-Vitamin D (CALCIUM + D PO) Take 1 tablet by mouth daily.  . Carboxymethylcellul-Glycerin (LUBRICATING EYE DROPS OP) Place 1 drop into both eyes daily as needed (dry eyes).  . cyclobenzaprine (FLEXERIL) 10 MG tablet Take 10 mg by mouth at bedtime as needed for muscle spasms.   [name] denosumab (PROLIA) 60 MG/ML SOSY injection Inject 60 mg into the skin every 6 (six) months.  . furosemide (LASIX) 20 MG tablet Take 1 tablet (20 mg total) by mouth daily.  [name] lactulose (CHRONULAC) 10 GM/15ML solution Take 20 g by mouth daily as needed for moderate constipation (constipation.).   [name] levothyroxine (SYNTHROID) 75 MCG tablet Take 1 tablet (75 mcg total) by mouth daily.  [name] lidocaine (XYLOCAINE) 5 % ointment Apply 1 application topically  2 (two) times daily as needed.  . meclizine (ANTIVERT) 25 MG tablet TAKE 1 TABLET BY MOUTH THREE TIMES DAILY AS NEEDED FOR DIZZINESS  . metoprolol tartrate (LOPRESSOR) 50 MG tablet Take 1 tablet by mouth twice daily  . Nutritional Supplements (,FEEDING SUPPLEMENT, PROSOURCE PLUS) liquid Take 30 mLs by mouth 2 (two) times daily between meals.  . nystatin (MYCOSTATIN/NYSTOP) powder Apply topically 2 (two) times daily as needed (skin irritation (under breasts)).  [name] ondansetron (ZOFRAN) 4 MG tablet Take 4 mg by mouth every 8 (eight) hours as needed.  [name] oxyCODONE (ROXICODONE) 5 MG/5ML solution Take 4 mLs (4 mg total) by mouth every 4 (four) hours as needed for moderate pain.  . polyethylene glycol (MIRALAX / GLYCOLAX) packet Take 17 g by mouth daily as needed for mild constipation.  . sodium chloride (OCEAN) 0.65 % SOLN nasal spray Place 1 spray into both nostrils as needed for congestion (nose  bleeds).  . valACYclovir (VALTREX) 500 MG tablet Take 500 mg by mouth daily as needed (breakouts).   Alveda Reasons 20 MG TABS tablet TAKE 1 TABLET BY MOUTH ONCE DAILY WITH SUPPER     Allergies:   Buprenorphine, Cymbalta [duloxetine hcl], Erythromycin, Hydrocodone, Hydromorphone, Iodinated diagnostic agents, Iodine, Levofloxacin, Metrizamide, Nsaids, Nucynta [tapentadol hcl], Nucynta [tapentadol], Other, Oxycodone, Pentazocine, Septra [bactrim], Sulfasalazine, Adhesive [tape], Latex, Morphine, and Sulfa antibiotics   Social History   Socioeconomic History  . Marital status: Divorced    Spouse name: none/divorced  . Number of children: Not on file  . Years of education: Not on file  . Highest education level: 12th grade  Occupational History  . Occupation: retired  Tobacco Use  . Smoking status: Former Smoker    Packs/day: 0.10    Years: 14.00    Pack years: 1.40    Types: Cigarettes  . Smokeless tobacco: Never Used  . Tobacco comment: 03/31/2018 "quit ~ 1980; someday smoker when I did smoke; never addicted"  Vaping Use  . Vaping Use: Never used  Substance and Sexual Activity  . Alcohol use: Never  . Drug use: Yes    Types: Oxycodone, Benzodiazepines    Comment: 03/31/2018 "for chronic neck and back pain", takes Klonopin at times.   . Sexual activity: Not Currently  Other Topics Concern  . Not on file  Social History Narrative  . Not on file   Social Determinants of Health   Financial Resource Strain: Low Risk   . Difficulty of Paying Living Expenses: Not hard at all  Food Insecurity: No Food Insecurity  . Worried About Charity fundraiser in the Last Year: Never true  . Ran Out of Food in the Last Year: Never true  Transportation Needs: No Transportation Needs  . Lack of Transportation (Medical): No  . Lack of Transportation (Non-Medical): No  Physical Activity: Insufficiently Active  . Days of Exercise per Week: 3 days  . Minutes of Exercise per Session: 20 min  Stress:  No Stress Concern Present  . Feeling of Stress : Only a little  Social Connections: Moderately Isolated  . Frequency of Communication with Friends and Family: More than three times a week  . Frequency of Social Gatherings with Friends and Family: Once a week  . Attends Religious Services: 1 to 4 times per year  . Active Member of Clubs or Organizations: No  . Attends Archivist Meetings: Never  . Marital Status: Divorced     Family History: The patient's family history includes Arthritis in her  mother; Diabetes in her brother, brother, and father; Heart Problems in her brother; Heart disease in her mother, sister, and sister; Hyperlipidemia in her sister and sister; Stroke in her sister; Uterine cancer in her mother.   ROS:   Please see the history of present illness.    All other systems reviewed and are negative.   EKGs/Labs/Other Studies Reviewed:    The following studies were reviewed today:  Echo 10/2020 1. Left ventricular ejection fraction, by estimation, is 50 to 55%. The  left ventricle has low normal function. The left ventricle has no regional  wall motion abnormalities.  2. The right ventricular size is mildly enlarged.  3. The mitral valve has been repaired/replaced. Trivial mitral valve  regurgitation. There is a 32 mm sorin Memo 4D prosthetic annuloplasty ring  present in the mitral position. Mean gradient 53mmHg at HR 67bpm. Procedure  Date: 01/17/20.  4. The aortic valve is tricuspid. There is mild calcification of the  aortic valve. There is mild thickening of the aortic valve. Aortic valve  regurgitation is mild. Mild aortic valve sclerosis is present, with no  evidence of aortic valve stenosis.  5. The inferior vena cava is dilated in size with <50% respiratory  variability, suggesting right atrial pressure of 15 mmHg.   Comparison(s): Compared to prior echo on 02/2020, the LVEF is slightly  lower at 50-55% (still within normal range). Mean  gradient across the  mitral valve remains stable at 39mmHg (previoulsy 25mmHg). RAP is now higher  at 53mmHg as well.   EKG:  EKG is not ordered today.   Recent Labs: 07/11/2020: ALT 24; Hemoglobin 11.7; Platelets 192.0 09/11/2020: TSH 2.50 09/21/2020: Magnesium 2.0 10/30/2020: BUN 19; Creatinine, Ser 0.81; NT-Pro BNP 768; Potassium 4.5; Sodium 143  Recent Lipid Panel    Component Value Date/Time   CHOL 237 (H) 09/23/2017 1420   TRIG 101.0 09/23/2017 1420   HDL 100.90 09/23/2017 1420   CHOLHDL 2 09/23/2017 1420   VLDL 20.2 09/23/2017 1420   LDLCALC 116 (H) 09/23/2017 1420   LDLDIRECT 136.5 06/08/2013 1049    Physical Exam:    VS:  BP (!) 142/86   Pulse 72   Ht 5\' 6"  (1.676 m)   Wt 120 lb 6.4 oz (54.6 kg)   SpO2 96%   BMI 19.43 kg/m     Wt Readings from Last 3 Encounters:  11/14/20 120 lb 6.4 oz (54.6 kg)  10/30/20 119 lb (54 kg)  10/01/20 121 lb 6.4 oz (55.1 kg)     GEN: Thin frail female in no acute distress HEENT: Normal NECK: No JVD; No carotid bruits LYMPHATICS: No lymphadenopathy CARDIAC: RRR, no murmurs, rubs, gallops RESPIRATORY:  Clear to auscultation without rales, wheezing or rhonchi  ABDOMEN: Soft, non-tender, non-distended MUSCULOSKELETAL:  No edema; No deformity  SKIN: Warm and dry NEUROLOGIC:  Alert and oriented x 3 PSYCHIATRIC:  Normal affect   ASSESSMENT AND PLAN:    1. Intermittent dyspnea/chronic diastolic heart failure Noted improvement in breathing after taking Lasix daily however symptoms reoccurred.  Weight stable at 119-120lb. Denies associated chest pain or palpitation.  Previously able to tell if going out of rhythm. She is also dealing with lower extremity edema and varicose veins.  Had " ultrasound of leg" this morning.  Reviewed with Dr. Johney Frame.  Unknown etiology of her intermittent dyspnea.  We will get BMP and BNP.  Advised to keep track of symptoms and vitals.  2. Status post mitral valve repair Echocardiogram 10/22/2020 LV function  of  50 to 55%, no wall motion abnormality, mitral valve mean gradient of 4 mmHg.  RAP 15 mmHg  3. PAF - Continue BB and Xarelto. No bleeding issue.   4.  Varicose veins - Followed by vascular   Medication Adjustments/Labs and Tests Ordered: Current medicines are reviewed at length with the patient today.  Concerns regarding medicines are outlined above.  Orders Placed This Encounter  Procedures  . Basic metabolic panel  . Pro b natriuretic peptide (BNP)   No orders of the defined types were placed in this encounter.   Patient Instructions  Medication Instructions:  Your physician recommends that you continue on your current medications as directed. Please refer to the Current Medication list given to you today.]  *If you need a refill on your cardiac medications before your next appointment, please call your pharmacy*   Lab Work: TODAY:  BMET & PRO BNP  If you have labs (blood work) drawn today and your tests are completely normal, you will receive your results only by: [name] MyChart Message (if you have MyChart) OR . A paper copy in the mail If you have any lab test that is abnormal or we need to change your treatment, we will call you to review the results.   Testing/Procedures: None ordered   Follow-Up: At [facility], you and your health needs are our priority.  As part of our continuing mission to provide you with exceptional heart care, we have created designated Provider Care Teams.  These Care Teams include your primary Cardiologist (physician) and Advanced Practice Providers (APPs -  Physician Assistants and Nurse Practitioners) who all work together to provide you with the care you need, when you need it.  We recommend signing up for the patient portal called "MyChart".  Sign up information is provided on this After Visit Summary.  MyChart is used to connect with patients for Virtual Visits (Telemedicine).  Patients are able to view lab/test results, encounter notes,  upcoming appointments, etc.  Non-urgent messages can be sent to your provider as well.   To learn more about what you can do with MyChart, go to [email or web address].    Your next appointment:   2 month(s)  The format for your next appointment:   In Person  Provider:   You may see Freada Bergeron, MD or one of the following Advanced Practice Providers on your designated Care Team:    Richardson Dopp, PA-C  Robbie Lis, PA-C    Other Instructions      Signed, [name], [place]  11/14/2020 4:03 PM    [place]

## 2020-11-15 LAB — BASIC METABOLIC PANEL
BUN/Creatinine Ratio: 27 (ref 12–28)
BUN: 20 mg/dL (ref 8–27)
CO2: 26 mmol/L (ref 20–29)
Calcium: 9.3 mg/dL (ref 8.7–10.3)
Chloride: 103 mmol/L (ref 96–106)
Creatinine, Ser: 0.74 mg/dL (ref 0.57–1.00)
Glucose: 99 mg/dL (ref 65–99)
Potassium: 4.6 mmol/L (ref 3.5–5.2)
Sodium: 142 mmol/L (ref 134–144)
eGFR: 84 mL/min/{1.73_m2} (ref >59)

## 2020-11-15 LAB — PRO B NATRIURETIC PEPTIDE: NT-Pro BNP: 790 pg/mL — ABNORMAL HIGH (ref 0–738)

## 2020-11-16 ENCOUNTER — Telehealth: Payer: Self-pay | Admitting: Cardiology

## 2020-11-16 NOTE — Telephone Encounter (Signed)
Patient is returning call to discuss lab results. 

## 2020-11-16 NOTE — Telephone Encounter (Signed)
Spoke with pt and advised per Bhagat, PA-C, Normal renal function. Fluid marker persistently minimally elevated. Increase lasix to 40mg  qd for 5 days than go back to 20mg  qd. Let us know how you feel while taking higher (40mg ) dose.  Pt verbalizes understanding and agrees with current plan.

## 2020-11-19 DIAGNOSIS — R0602 Shortness of breath: Secondary | ICD-10-CM

## 2020-11-19 DIAGNOSIS — R0600 Dyspnea, unspecified: Secondary | ICD-10-CM

## 2020-11-21 NOTE — Telephone Encounter (Signed)
Freada Bergeron, MD  Venetia Night J 20 hours ago (11:12 AM)   HP   Hello Ms. [name],   I am so sorry this is going on. Vin talked to me about your appointment and I agreed with his management. I was encouraged to see your fluid numbers are getting better. I am not sure what is making your shortness of breath continue/worsen. Are you still taking your xarelto daily? How are your weights and have you been gaining weight? Are you still urinating okay?  We can also refer you to a lung doctor as you are doing better from a fluid standpoint and the heart looks good on the echo in April.   Pt is aware via mychart that referral to [place] was placed in the system and our [place] will be in contact with her, to arrange this new pt appt.  Will send a staff message to [facility] to arrange referral.

## 2020-11-22 DIAGNOSIS — I83813 Varicose veins of bilateral lower extremities with pain: Secondary | ICD-10-CM | POA: Diagnosis not present

## 2020-11-22 DIAGNOSIS — L97919 Non-pressure chronic ulcer of unspecified part of right lower leg with unspecified severity: Secondary | ICD-10-CM | POA: Diagnosis not present

## 2020-11-22 DIAGNOSIS — R600 Localized edema: Secondary | ICD-10-CM | POA: Diagnosis not present

## 2020-11-22 DIAGNOSIS — I83019 Varicose veins of right lower extremity with ulcer of unspecified site: Secondary | ICD-10-CM | POA: Diagnosis not present

## 2020-11-23 NOTE — Telephone Encounter (Signed)
Pt will see Pulmonology on 6/14 at 1130 for new consult appt with Dr Shearon Stalls.  Pt made aware of appt date and time by Pulmonology office.

## 2020-11-23 NOTE — Telephone Encounter (Signed)
Pt is scheduled for new pt appt with Pulmonology on 12/18/20 at 1130 to see Dr. Lenice Llamas. Pt made aware of appt date and time by [place] and Pulmonology office.

## 2020-11-30 ENCOUNTER — Ambulatory Visit: Payer: Medicare Other | Admitting: *Deleted

## 2020-11-30 DIAGNOSIS — J383 Other diseases of vocal cords: Secondary | ICD-10-CM | POA: Diagnosis not present

## 2020-11-30 DIAGNOSIS — R1310 Dysphagia, unspecified: Secondary | ICD-10-CM | POA: Diagnosis not present

## 2020-11-30 DIAGNOSIS — H6123 Impacted cerumen, bilateral: Secondary | ICD-10-CM | POA: Diagnosis not present

## 2020-12-04 DIAGNOSIS — B078 Other viral warts: Secondary | ICD-10-CM | POA: Diagnosis not present

## 2020-12-04 DIAGNOSIS — Z85820 Personal history of malignant melanoma of skin: Secondary | ICD-10-CM | POA: Diagnosis not present

## 2020-12-04 DIAGNOSIS — D485 Neoplasm of uncertain behavior of skin: Secondary | ICD-10-CM | POA: Diagnosis not present

## 2020-12-10 ENCOUNTER — Ambulatory Visit (INDEPENDENT_AMBULATORY_CARE_PROVIDER_SITE_OTHER): Payer: Medicare Other | Admitting: Family Medicine

## 2020-12-10 ENCOUNTER — Telehealth: Payer: Self-pay | Admitting: *Deleted

## 2020-12-10 ENCOUNTER — Encounter: Payer: Self-pay | Admitting: Family Medicine

## 2020-12-10 ENCOUNTER — Other Ambulatory Visit: Payer: Self-pay

## 2020-12-10 VITALS — BP 122/70 | HR 68 | Temp 97.9°F | Resp 17 | Ht 66.0 in | Wt 119.2 lb

## 2020-12-10 DIAGNOSIS — F419 Anxiety disorder, unspecified: Secondary | ICD-10-CM

## 2020-12-10 DIAGNOSIS — E039 Hypothyroidism, unspecified: Secondary | ICD-10-CM | POA: Diagnosis not present

## 2020-12-10 DIAGNOSIS — I480 Paroxysmal atrial fibrillation: Secondary | ICD-10-CM | POA: Diagnosis not present

## 2020-12-10 DIAGNOSIS — E785 Hyperlipidemia, unspecified: Secondary | ICD-10-CM

## 2020-12-10 LAB — CBC WITH DIFFERENTIAL/PLATELET
Basophils Absolute: 0 10*3/uL (ref 0.0–0.1)
Basophils Relative: 0.5 % (ref 0.0–3.0)
Eosinophils Absolute: 0.3 10*3/uL (ref 0.0–0.7)
Eosinophils Relative: 4.9 % (ref 0.0–5.0)
HCT: 38.2 % (ref 36.0–46.0)
Hemoglobin: 12.5 g/dL (ref 12.0–15.0)
Lymphocytes Relative: 30.1 % (ref 12.0–46.0)
Lymphs Abs: 1.8 10*3/uL (ref 0.7–4.0)
MCHC: 32.9 g/dL (ref 30.0–36.0)
MCV: 87.3 fl (ref 78.0–100.0)
Monocytes Absolute: 0.5 10*3/uL (ref 0.1–1.0)
Monocytes Relative: 8.1 % (ref 3.0–12.0)
Neutro Abs: 3.3 10*3/uL (ref 1.4–7.7)
Neutrophils Relative %: 56.4 % (ref 43.0–77.0)
Platelets: 191 10*3/uL (ref 150.0–400.0)
RBC: 4.37 Mil/uL (ref 3.87–5.11)
RDW: 15.2 % (ref 11.5–15.5)
WBC: 5.8 10*3/uL (ref 4.0–10.5)

## 2020-12-10 LAB — TSH: TSH: 0.63 u[IU]/mL (ref 0.35–4.50)

## 2020-12-10 LAB — T4, FREE: Free T4: 1.34 ng/dL (ref 0.60–1.60)

## 2020-12-10 LAB — T3, FREE: T3, Free: 3 pg/mL (ref 2.3–4.2)

## 2020-12-10 NOTE — Progress Notes (Signed)
   Subjective:    Patient ID: [name], female    DOB: July 23, 1945, 75 y.o.   MRN: [id number]  HPI Hypothyroid- ongoing issue for pt.  Currently on Levothyroxine 44mcg daily.  + palpitations due to Afib.  Denies changes to skin/hair/nails.  + fatigue.  Afib- had episode on Saturday night that lasted 3 hrs.  Is on Metoprolol 50mg  BID.  Has not discussed w/ Cards yet.  Anxiety- pt has reached out to a counseling center but has yet to schedule an appt.  'I just feel overloaded with all of this medical stuff'.  Pt's excuse for not scheduling is that she has 9 medical/dental appts this month.  Hyperlipidemia- pt has been trying to control w/o medication.  She has appt upcoming for vascular insufficiency.   Review of Systems For ROS see HPI   This visit occurred during the SARS-CoV-2 public health emergency.  Safety protocols were in place, including screening questions prior to the visit, additional usage of staff PPE, and extensive cleaning of exam room while observing appropriate contact time as indicated for disinfecting solutions.       Objective:   Physical Exam Vitals reviewed.  Constitutional:      General: She is not in acute distress.    Appearance: Normal appearance.  HENT:     Head: Normocephalic and atraumatic.  Cardiovascular:     Rate and Rhythm: Normal rate.  Pulmonary:     Effort: Pulmonary effort is normal.  Musculoskeletal:     Right lower leg: No edema.     Left lower leg: No edema.  Skin:    General: Skin is warm and dry.  Neurological:     General: No focal deficit present.     Mental Status: She is alert and oriented to person, place, and time.  Psychiatric:     Comments: Anxious, nearly tearful Circular thought process           Assessment & Plan:

## 2020-12-10 NOTE — Patient Instructions (Signed)
Follow up in 6 months to recheck cholesterol We'll notify you of your lab results and make any changes if needed Please call and schedule a counseling appt to help lower your stress levels Call with any questions or concerns Hang in there!

## 2020-12-10 NOTE — Telephone Encounter (Signed)
Outpt Rehab Referral Received: Today [name], [name] Kansky, LPN FYI,   Outpatient rehab center in [place] has called patient 3 times and spoke to her. Each time, pt stated that she would call back to schedule appointment. It has been since march and she has not called to schedule appt. Referral has been closed.

## 2020-12-11 DIAGNOSIS — G894 Chronic pain syndrome: Secondary | ICD-10-CM | POA: Diagnosis not present

## 2020-12-11 DIAGNOSIS — Z79891 Long term (current) use of opiate analgesic: Secondary | ICD-10-CM | POA: Diagnosis not present

## 2020-12-11 LAB — HEPATIC FUNCTION PANEL
ALT: 17 U/L (ref 0–35)
AST: 22 U/L (ref 0–37)
Albumin: 4.2 g/dL (ref 3.5–5.2)
Alkaline Phosphatase: 58 U/L (ref 39–117)
Bilirubin, Direct: 0.1 mg/dL (ref 0.0–0.3)
Total Bilirubin: 0.4 mg/dL (ref 0.2–1.2)
Total Protein: 6.9 g/dL (ref 6.0–8.3)

## 2020-12-11 LAB — LIPID PANEL
Cholesterol: 175 mg/dL (ref 0–200)
HDL: 80.4 mg/dL (ref >39.00)
LDL Cholesterol: 79 mg/dL (ref 0–99)
NonHDL: 95.06
Total CHOL/HDL Ratio: 2
Triglycerides: 79 mg/dL (ref 0.0–149.0)
VLDL: 15.8 mg/dL (ref 0.0–40.0)

## 2020-12-11 LAB — BASIC METABOLIC PANEL
BUN: 22 mg/dL (ref 6–23)
CO2: 30 mEq/L (ref 19–32)
Calcium: 9.1 mg/dL (ref 8.4–10.5)
Chloride: 102 mEq/L (ref 96–112)
Creatinine, Ser: 0.78 mg/dL (ref 0.40–1.20)
GFR: 73.81 mL/min (ref >60.00)
Glucose, Bld: 86 mg/dL (ref 70–99)
Potassium: 4.3 mEq/L (ref 3.5–5.1)
Sodium: 142 mEq/L (ref 135–145)

## 2020-12-11 NOTE — Assessment & Plan Note (Signed)
Chronic problem.  Pt is currently asymptomatic w/ exception of ongoing fatigue.  Suspect some of her fatigue is medication related and some is due to her untreated anxiety/depression.  Check labs.  Adjust meds prn

## 2020-12-11 NOTE — Assessment & Plan Note (Signed)
Ongoing issue for pt.  She has been trying to control w/o medication but now there is concern for possible vascular/arterial insufficiency (appt w/ vascular upcoming).  Check labs and start meds if needed.

## 2020-12-11 NOTE — Assessment & Plan Note (Signed)
Ongoing issue for pt.  She still has not scheduled an appt for counseling.  She states that she is overwhelmed with her medical issues so she doesn't have time.  I told her that bc she is overwhelmed, she needs to make time.  Again encouraged her to schedule but I have been doing so for years and have not made any headway.

## 2020-12-11 NOTE — Assessment & Plan Note (Signed)
Continues to have symptomatic afib episodes.  Last occurred on Saturday and lasted 3 hrs.  On Metoprolol 50mg  BID and xarelto daily.  Encouraged her to speak w/ Cardiology about this.

## 2020-12-18 ENCOUNTER — Encounter: Payer: Self-pay | Admitting: Internal Medicine

## 2020-12-18 ENCOUNTER — Other Ambulatory Visit: Payer: Self-pay

## 2020-12-18 ENCOUNTER — Ambulatory Visit (INDEPENDENT_AMBULATORY_CARE_PROVIDER_SITE_OTHER): Payer: Medicare Other | Admitting: Internal Medicine

## 2020-12-18 VITALS — BP 110/68 | HR 65 | Temp 97.2°F | Ht 66.0 in | Wt 118.6 lb

## 2020-12-18 DIAGNOSIS — M4135 Thoracogenic scoliosis, thoracolumbar region: Secondary | ICD-10-CM | POA: Diagnosis not present

## 2020-12-18 DIAGNOSIS — R0602 Shortness of breath: Secondary | ICD-10-CM | POA: Diagnosis not present

## 2020-12-18 DIAGNOSIS — Z9889 Other specified postprocedural states: Secondary | ICD-10-CM | POA: Diagnosis not present

## 2020-12-18 NOTE — Progress Notes (Signed)
[name]    [id number]    1945-08-04  Primary Care Physician:Tabori, [name], MD  Referring Physician: Freada Bergeron, MD [phone] [address] [place],  [place] [address] Reason for Consultation: shortness of breath Date of Consultation: 12/18/2020  Chief complaint:   Chief Complaint  Patient presents with   Consult    Had shortness of breath prior to open heart surgery last July, some improvement after surgery but now worse. Concerned that previous back surgeries could be contributing to her SOB.      HPI: [name] is a 75 y.o. woman with history of mitral regurgitation s/p minimally invasive MV repair and MAZE procedure complicated by LV free wall rupture (repaired by median sternotomy) in April 2021 as well as history of scoliosis with 7 spinal surgeries at age of 53.  She has chronic pain related to her spinal surgeries. She has anxiety and depression related to this.    She is here for new patient evaluation of shortness of breath.  Before her mitral valve repair she had no breathing issues. The issues all started after her surgery. Her breathing initially had improved after her MV repair when she was using incentive spirometer.  Now has gotten worse this last few months. Dyspnea is with exertion, even if she is just walking around in a store. She wonders if her breathing issues are related to her dyspnea, and she has difficulty with walking due to her neuropathy.   She did home PT and rehab for a few weeks, but didn't do cardiac rehab.   No childhood respiratory disease. No asthma, no allergies. She does have chronic watery eyes and runny nose.   She has never been prescribed any breathing treatments or inhalers.   Denies chest pain, she does have palpitations associated with getting upset, stressed or tired.   She cannot identify any remitting factors - usually just sits down and it goes away on its own.   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  Exposures: lives alone at home, can do simple daily activities, but has been needing help with heavy cleaning.  Smoking history: former remote smoking history, no passive smoke exposure   Social History   Occupational History   Occupation: retired  Tobacco Use   Smoking status: Former    Packs/day: 0.10    Years: 14.00    Pack years: 1.40    Types: Cigarettes    Quit date: 1980    Years since quitting: 42.4   Smokeless tobacco: Never   Tobacco comments:    03/31/2018 "quit ~ 1980; someday smoker when I did smoke; never addicted"  Vaping Use   Vaping Use: Never used  Substance and Sexual Activity   Alcohol use: Never   Drug use: Yes    Types: Oxycodone, Benzodiazepines    Comment: 03/31/2018 "for chronic neck and back pain", takes Klonopin at times.    Sexual activity: Not Currently    Relevant family history:  Family History  Problem Relation Age of Onset   Diabetes Father    Hyperlipidemia Sister    Heart disease Sister    Stroke Sister    Diabetes Brother    Hyperlipidemia Sister    Heart disease Sister    Arthritis Mother    Heart disease Mother    Uterine cancer Mother    Diabetes Brother  Heart Problems Brother     Past Medical History:  Diagnosis Date   Allergies    Anxiety    Arthritis    "maybe in my back" (03/31/2018)   Benign paroxysmal positional vertigo 59/56/3875   Complication of anesthesia    Fracture of multiple ribs 2015   "don't know from what; dx'd when I in hospital for 1st back OR" (03/31/2018)   GERD (gastroesophageal reflux disease)    Hair loss 04/12/2012   Herpes    History of blood transfusion    "twice; related to back OR" (03/31/2018)   History of kidney stones    Interstitial cystitis 11/06/2011   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   "right"   Mitral regurgitation    Osteopenia 02/18/2012   Osteoporosis    PAF (paroxysmal  atrial fibrillation) ([facility]) 2012   Peripheral neuropathy 11/06/2011   PMDD (premenstrual dysphoric disorder)    PONV (postoperative nausea and vomiting)    nausea, vomiting, hives and dizziness    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/2020   Complete bilateral atrial lesion set using cryothermy and bipolar radiofrequency ablation with clipping of LA appendage via right mini-thoracotomy approach   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   Complex valvuloplasty including artificial Gore-tex neochord placement x12 with 60mm Sorin Memo 4D ring annuloplasty   Seasonal allergies    Vaginal delivery    ONE NSVD   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    Past Surgical History:  Procedure Laterality Date   ANTERIOR CERVICAL DECOMP/DISCECTOMY FUSION  ~ 2003   BACK SURGERY     BREAST SURGERY     BREAST BIOPSY--RIGHT BENIGN   BUNIONECTOMY Bilateral    CARDIOVERSION N/A 01/26/2020   Procedure: CARDIOVERSION;  Surgeon: Geralynn Rile, MD;  Location: [facility];  Service: Cardiovascular;  Laterality: N/A;   COSMETIC SURGERY  2016   "back of my neck; related to earlier fusion"   [place]  "several times"   [place] Right    "removed bone protruding out of my forehead"   HARDWARE REMOVAL  2016   "related to neck OR"   INCONTINENCE SURGERY     IR THORACENTESIS ASP PLEURAL SPACE W/IMG GUIDE  02/16/2020   MINIMALLY INVASIVE MAZE PROCEDURE N/A 01/17/2020   Procedure: MINIMALLY INVASIVE MAZE PROCEDURE;  Surgeon: Rexene Alberts, MD;  Location: [facility];  Service: Open Heart Surgery;  Laterality: N/A;   MITRAL VALVE REPAIR Right 01/17/2020   Procedure: MINIMALLY INVASIVE MITRAL VALVE REPAIR (MVR) USING MEMO 4D 32MM;  Surgeon: Rexene Alberts, MD;  Location: [facility];  Service: Open Heart Surgery;  Laterality: Right;   POSTERIOR CERVICAL FUSION/FORAMINOTOMY  ~ 2008; 2015   RIGHT/LEFT HEART CATH AND CORONARY ANGIOGRAPHY N/A 12/02/2019    Procedure: RIGHT/LEFT HEART CATH AND CORONARY ANGIOGRAPHY;  Surgeon: Burnell Blanks, MD;  Location: [facility];  Service: Cardiovascular;  Laterality: N/A;   SHOULDER ARTHROSCOPY W/ ROTATOR CUFF REPAIR Right 2012   SPINAL FUSION  06/2014 - 2018 X ?7   "scoliosis; my entire back"   TEE WITHOUT CARDIOVERSION N/A 11/21/2019   Procedure: TRANSESOPHAGEAL ECHOCARDIOGRAM (TEE);  Surgeon: Josue Hector, MD;  Location: [facility];  Service: Cardiovascular;  Laterality: N/A;   TEE WITHOUT CARDIOVERSION N/A 01/17/2020   Procedure: TRANSESOPHAGEAL ECHOCARDIOGRAM (TEE);  Surgeon: Rexene Alberts, MD;  Location: [facility];  Service: Open Heart Surgery;  Laterality: N/A;   TEE WITHOUT CARDIOVERSION N/A 01/26/2020  Procedure: TRANSESOPHAGEAL ECHOCARDIOGRAM (TEE);  Surgeon: Geralynn Rile, MD;  Location: [facility];  Service: Cardiovascular;  Laterality: N/A;   TUBAL LIGATION     VAGINAL HYSTERECTOMY     TVH     Physical Exam: Blood pressure 110/68, pulse 65, temperature (!) 97.2 F (36.2 C), temperature source Temporal, height 5\' 6"  (1.676 m), weight 118 lb 9.6 oz (53.8 kg), SpO2 98 %. Gen:      No acute distress, kyphoscoliosis ENT:  no nasal polyps, mucus membranes moist Lungs:    No increased respiratory effort, symmetric chest wall excursion, clear to auscultation bilaterally, no wheezes or crackles CV:         Regular rate and rhythm; no murmurs, rubs, or gallops.  No pedal edema Abd:      + bowel sounds; soft, non-tender; no distension MSK: no acute synovitis of DIP or PIP joints, no mechanics hands.  Skin:      bilateral venous stasis and varicosities. Neuro: normal speech, no focal facial asymmetry Psych: anxious   Data Reviewed/Medical Decision Making:  Independent interpretation of tests: Imaging:  Review of patient's chest xray March 2022 images revealed hardware rom prior spinal surgeries, with kyphoscoliosis. The patient's images have been independently  reviewed by me.    PFTs: I have personally reviewed the patient's PFTs and no chest wall restriction, no airflow limitation PFT Results Latest Ref Rng & Units 12/01/2019  FVC-Pre L 2.13  FVC-Predicted Pre % 69  Pre FEV1/FVC % % 74  FEV1-Pre L 1.57  FEV1-Predicted Pre % 68  DLCO uncorrected ml/min/mmHg 14.09  DLCO UNC% % 68  DLCO corrected ml/min/mmHg 14.56  DLCO COR %Predicted % 71  DLVA Predicted % 105  TLC L 5.36  TLC % Predicted % 100  RV % Predicted % 140    Labs: Lab Results  Component Value Date   WBC 5.8 12/10/2020   HGB 12.5 12/10/2020   HCT 38.2 12/10/2020   MCV 87.3 12/10/2020   PLT 191.0 12/10/2020   Lab Results  Component Value Date   NA 142 12/10/2020   K 4.3 12/10/2020   CL 102 12/10/2020   CO2 30 12/10/2020      Immunization status:  Immunization History  Administered Date(s) Administered   DTaP 07/07/2009   Influenza Inj Mdck Quad Pf 03/25/2019   Influenza Split 03/07/2012   Influenza, High Dose Seasonal PF 05/02/2016   Influenza,inj,Quad PF,6+ Mos 06/08/2013, 04/06/2015, 05/02/2017, 05/08/2018   PFIZER(Purple Top)SARS-COV-2 Vaccination 08/11/2019, 09/01/2019   Pneumococcal Conjugate-13 04/19/2015   Pneumococcal Polysaccharide-23 08/07/2009, 12/27/2013   Tdap 08/02/2020   Zoster, Live 07/07/2010     I reviewed prior external note(s) from cardiology, PCP, CT surgery.   I reviewed the result(s) of the labs and imaging as noted above.   I have ordered PFT   Assessment:  Shortness of breath Kyphoscoliosis Paroxysmal Atrial Fibrillation   Plan/Recommendations: Ms. Fujita has dyspnea on exertion that has worsened since her major cardiac surgery last year.  Her echo shows minimal valvular disease and preserved ejection fraction in April 2022.  Her PFTs from 2021 were essentially normal, so although chest wall restriction from scoliosis is a possibilty, it seems less likely. Will proceed with PFTs since there has a been a significant change  since last year. I will call her with the results. I think that if these are unchanged I'd recommend cardiac rehab if this is a possibility for her - deconditioning is high on my list of why she might be feeling  short of breath. She has several upcoming appointments including procedure for her venous varicosities. She would like to get "caught up" on her appointments before considering any other therapies. I will contact her with results of the PFTs.   We discussed disease management and progression at length today.     Return to Care: As needed.   Lenice Llamas, MD [facility]  CC: Freada Bergeron, MD

## 2020-12-18 NOTE — Patient Instructions (Signed)
Follow up as needed - I will contact you with results.  Prior to next visit patient should have: Full set of PFTs - 1 hour

## 2020-12-19 DIAGNOSIS — N644 Mastodynia: Secondary | ICD-10-CM | POA: Diagnosis not present

## 2020-12-19 DIAGNOSIS — R922 Inconclusive mammogram: Secondary | ICD-10-CM | POA: Diagnosis not present

## 2020-12-19 LAB — HM MAMMOGRAPHY

## 2020-12-20 ENCOUNTER — Telehealth: Payer: Self-pay | Admitting: Cardiology

## 2020-12-20 NOTE — Telephone Encounter (Signed)
Pt is calling to find out what is a way that she can get the assistance paperwork to them so they can fill it out

## 2020-12-20 NOTE — Telephone Encounter (Signed)
Pt is calling to let [name] and [name] RN know that she will be dropping off [employer] pt assistance forms for Xarelto, off by the office tomorrow, for them to fill out.  Informed the pt that I will make Sherri aware that forms will be dropped off at the front desk tomorrow.  Pt verbalized understanding and agrees with this plan.

## 2020-12-24 ENCOUNTER — Telehealth: Payer: Self-pay

## 2020-12-24 NOTE — Telephone Encounter (Signed)
Patient called to make sure the office did get the fax.  Patient would like to pick up the form as soon as possible. Please let her know when the form is ready to be picked up.

## 2020-12-24 NOTE — Telephone Encounter (Signed)
**Note De-Identified Shailen Thielen Obfuscation** I have completed the MD page of the pts [name] and [name] application and emailed it to Dr [employer] nurse so she can obtain his signature, date it and to call the pt to let her know that it is ready for pick up at the front office at Dr [employer] office at [facility] in [place] or to let me know when its ready for pck up and I will contact the pt.

## 2020-12-24 NOTE — Telephone Encounter (Signed)
**Note De-Identified [name] Obfuscation** The provider page of a [place] pt asst application was faxed to the office with a note attached asking [place] to complete the page, have Dr Curt Bears sign and date it and to then call the pt so she can come to the office to pick up as she is sending her application to [employer] herself.  I have completed the page and emailed it to Dr [employer] nurse so she can obtain his signature, date it, and to either call the pt to advise it is ready fo pick up in the front office at Dr [employer] office in [place] or si she can let me know when it is ready for pick up and I will call the pt to advise.

## 2020-12-24 NOTE — Telephone Encounter (Signed)
MD signature obtained. Will not be back in [place] office until mid week, will notify pt then that signed formed will be at front desk for her to pick up

## 2020-12-26 DIAGNOSIS — R600 Localized edema: Secondary | ICD-10-CM | POA: Diagnosis not present

## 2020-12-26 DIAGNOSIS — I83813 Varicose veins of bilateral lower extremities with pain: Secondary | ICD-10-CM | POA: Diagnosis not present

## 2020-12-26 DIAGNOSIS — I83019 Varicose veins of right lower extremity with ulcer of unspecified site: Secondary | ICD-10-CM | POA: Diagnosis not present

## 2020-12-26 DIAGNOSIS — L97919 Non-pressure chronic ulcer of unspecified part of right lower leg with unspecified severity: Secondary | ICD-10-CM | POA: Diagnosis not present

## 2020-12-28 ENCOUNTER — Other Ambulatory Visit (HOSPITAL_COMMUNITY)
Admission: RE | Admit: 2020-12-28 | Discharge: 2020-12-28 | Disposition: A | Discharge status: Home / Self Care | Payer: Medicare Other | Source: Ambulatory Visit | Attending: Internal Medicine | Admitting: Internal Medicine

## 2020-12-28 DIAGNOSIS — Z20822 Contact with and (suspected) exposure to covid-19: Secondary | ICD-10-CM | POA: Diagnosis not present

## 2020-12-28 DIAGNOSIS — Z01812 Encounter for preprocedural laboratory examination: Secondary | ICD-10-CM | POA: Diagnosis not present

## 2020-12-28 LAB — SARS CORONAVIRUS 2 (TAT 6-24 HRS): SARS Coronavirus 2: NEGATIVE

## 2020-12-28 NOTE — Telephone Encounter (Signed)
Follow Up:     Pt said to please call and let her know if they are ready or not.

## 2020-12-28 NOTE — Telephone Encounter (Signed)
MD signed form. Faxed to [facility] for records. Pt informed letter signed and at front desk for pick up. Pt appreciates our help with this.

## 2020-12-28 NOTE — Telephone Encounter (Signed)
Follow Up:    Pt is calling to see if her form is ready for her to pick up.

## 2021-01-01 ENCOUNTER — Ambulatory Visit (INDEPENDENT_AMBULATORY_CARE_PROVIDER_SITE_OTHER): Payer: Medicare Other | Admitting: Internal Medicine

## 2021-01-01 ENCOUNTER — Other Ambulatory Visit: Payer: Self-pay

## 2021-01-01 DIAGNOSIS — R0602 Shortness of breath: Secondary | ICD-10-CM | POA: Diagnosis not present

## 2021-01-01 NOTE — Progress Notes (Signed)
Full PFT performed today. °

## 2021-01-01 NOTE — Patient Instructions (Signed)
Full PFT performed today. °

## 2021-01-02 ENCOUNTER — Encounter: Payer: Self-pay | Admitting: *Deleted

## 2021-01-03 DIAGNOSIS — Z48811 Encounter for surgical aftercare following surgery on the nervous system: Secondary | ICD-10-CM | POA: Diagnosis not present

## 2021-01-03 DIAGNOSIS — G894 Chronic pain syndrome: Secondary | ICD-10-CM | POA: Diagnosis not present

## 2021-01-03 DIAGNOSIS — M542 Cervicalgia: Secondary | ICD-10-CM | POA: Diagnosis not present

## 2021-01-03 DIAGNOSIS — M5385 Other specified dorsopathies, thoracolumbar region: Secondary | ICD-10-CM | POA: Diagnosis not present

## 2021-01-03 DIAGNOSIS — Z20822 Contact with and (suspected) exposure to covid-19: Secondary | ICD-10-CM | POA: Diagnosis not present

## 2021-01-03 DIAGNOSIS — M8588 Other specified disorders of bone density and structure, other site: Secondary | ICD-10-CM | POA: Diagnosis not present

## 2021-01-03 DIAGNOSIS — M545 Low back pain, unspecified: Secondary | ICD-10-CM | POA: Diagnosis not present

## 2021-01-03 DIAGNOSIS — Z79891 Long term (current) use of opiate analgesic: Secondary | ICD-10-CM | POA: Diagnosis not present

## 2021-01-03 DIAGNOSIS — Z981 Arthrodesis status: Secondary | ICD-10-CM | POA: Diagnosis not present

## 2021-01-03 DIAGNOSIS — Z9689 Presence of other specified functional implants: Secondary | ICD-10-CM | POA: Diagnosis not present

## 2021-01-09 DIAGNOSIS — G894 Chronic pain syndrome: Secondary | ICD-10-CM | POA: Diagnosis not present

## 2021-01-09 DIAGNOSIS — Z981 Arthrodesis status: Secondary | ICD-10-CM | POA: Diagnosis not present

## 2021-01-14 ENCOUNTER — Encounter: Payer: Medicare Other | Admitting: Thoracic Surgery (Cardiothoracic Vascular Surgery)

## 2021-01-14 ENCOUNTER — Telehealth: Payer: Self-pay | Admitting: Internal Medicine

## 2021-01-14 NOTE — Telephone Encounter (Signed)
Pt is calling to get her results from her PFT that was done on (01/01/2021).  Pls regard; [phone]

## 2021-01-14 NOTE — Telephone Encounter (Signed)
Called and spoke with patient who is calling to get her results from her PFT that was done on (01/01/2021).  Dr. Shearon Stalls please advise

## 2021-01-15 ENCOUNTER — Telehealth: Payer: Self-pay | Admitting: Internal Medicine

## 2021-01-15 ENCOUNTER — Telehealth (INDEPENDENT_AMBULATORY_CARE_PROVIDER_SITE_OTHER): Payer: Medicare Other | Admitting: Thoracic Surgery (Cardiothoracic Vascular Surgery)

## 2021-01-15 ENCOUNTER — Encounter: Payer: Self-pay | Admitting: Family Medicine

## 2021-01-15 ENCOUNTER — Other Ambulatory Visit: Payer: Self-pay | Admitting: Family Medicine

## 2021-01-15 ENCOUNTER — Other Ambulatory Visit: Payer: Self-pay

## 2021-01-15 ENCOUNTER — Encounter: Payer: Self-pay | Admitting: Thoracic Surgery (Cardiothoracic Vascular Surgery)

## 2021-01-15 ENCOUNTER — Ambulatory Visit (INDEPENDENT_AMBULATORY_CARE_PROVIDER_SITE_OTHER): Payer: Medicare Other | Admitting: Family Medicine

## 2021-01-15 VITALS — BP 120/70 | HR 71 | Temp 97.4°F | Resp 19 | Ht 66.0 in | Wt 119.2 lb

## 2021-01-15 DIAGNOSIS — Z9889 Other specified postprocedural states: Secondary | ICD-10-CM | POA: Diagnosis not present

## 2021-01-15 DIAGNOSIS — I340 Nonrheumatic mitral (valve) insufficiency: Secondary | ICD-10-CM

## 2021-01-15 DIAGNOSIS — L237 Allergic contact dermatitis due to plants, except food: Secondary | ICD-10-CM | POA: Diagnosis not present

## 2021-01-15 DIAGNOSIS — I5032 Chronic diastolic (congestive) heart failure: Secondary | ICD-10-CM

## 2021-01-15 DIAGNOSIS — E785 Hyperlipidemia, unspecified: Secondary | ICD-10-CM

## 2021-01-15 LAB — PULMONARY FUNCTION TEST
DL/VA % pred: 110 %
DL/VA: 4.48 ml/min/mmHg/L
DLCO cor % pred: 108 %
DLCO cor: 22.12 ml/min/mmHg
DLCO unc % pred: 105 %
DLCO unc: 21.48 ml/min/mmHg
FEF 25-75 Post: 1.52 L/sec
FEF 25-75 Pre: 0.91 L/sec
FEF2575-%Change-Post: 66 %
FEF2575-%Pred-Post: 87 %
FEF2575-%Pred-Pre: 52 %
FEV1-%Change-Post: 18 %
FEV1-%Pred-Post: 59 %
FEV1-%Pred-Pre: 50 %
FEV1-Post: 1.36 L
FEV1-Pre: 1.15 L
FEV1FVC-%Change-Post: 9 %
FEV1FVC-%Pred-Pre: 99 %
FEV6-%Change-Post: 7 %
FEV6-%Pred-Post: 58 %
FEV6-%Pred-Pre: 53 %
FEV6-Post: 1.67 L
FEV6-Pre: 1.55 L
FEV6FVC-%Pred-Post: 105 %
FEV6FVC-%Pred-Pre: 105 %
FVC-%Change-Post: 7 %
FVC-%Pred-Post: 55 %
FVC-%Pred-Pre: 51 %
FVC-Post: 1.67 L
FVC-Pre: 1.55 L
Post FEV1/FVC ratio: 81 %
Post FEV6/FVC ratio: 100 %
Pre FEV1/FVC ratio: 74 %
Pre FEV6/FVC Ratio: 100 %
RV % pred: 131 %
RV: 3.18 L
TLC % pred: 84 %
TLC: 4.5 L

## 2021-01-15 MED ORDER — TRIAMCINOLONE ACETONIDE 0.1 % EX OINT: 1.0000 "application " | TOPICAL_OINTMENT | Freq: Two times a day (BID) | CUTANEOUS | 1 refills | Status: AC

## 2021-01-15 NOTE — Progress Notes (Signed)
[address]       [place] [address]             [place] TELEPHONE VIRTUAL OFFICE NOTE  Referring Provider is Josue Hector, MD Primary Cardiologist is Freada Bergeron, MD PCP is [name], MD   HPI:  I spoke with [name] (DOB Jan 13, 1946 ) via telephone on 01/15/2021 at 3:23 PM and verified that I was speaking with the correct person using more than one form of identification.  We discussed the fact that I was contacting them from my office and they were located at home, as well as the reason(s) for conducting our visit virtually instead of in-person.  The patient expressed understanding the circumstances and agreed to proceed as described.   Patient is a 75 year old female with history of mitral regurgitation, recurrent paroxysmal atrial fibrillation on long-term anticoagulation, chronic diastolic heart failure, severe scoliosis for which she has undergone numerous surgical procedures, chronic pain on long-term oral narcotics, anxiety, osteoporosis, degenerative arthritis, GE reflux disease, peripheral neuropathy, and severe chronic physical deconditioning who underwent mitral valve repair and maze procedure mitral valve repair and Maze procedure on January 17, 2020.  I spoke with the patient over the telephone today at her request because of a variety of questions that she had about her previous surgery.  She tells me that for a while she was having some increased shortness of breath while she was off of Lasix, but this has improved since she resumed Lasix.  She is also recently undergone some pulmonary function tests which looked okay although they did reveal some decrease in her lung volumes.  Her questions primarily related to the fact that she had a small suture removed from her anterior chest several months ago.  She still feels a slight bump where the suture used to be, although there is no associated redness or  drainage.  She also discussed the fact that the ridges on her chest have become more prominent since she has lost weight.     Current Outpatient Medications  Medication Sig Dispense Refill   acetaminophen (TYLENOL) 500 MG tablet Take 500 mg by mouth every 6 (six) hours as needed for moderate pain.      b complex vitamins tablet Take 1 tablet by mouth daily.     Calcium Carbonate Antacid (TUMS CHEWY BITES PO) Take 1 tablet by mouth daily as needed (reflux).      Calcium Citrate-Vitamin D (CALCIUM + D PO) Take 1 tablet by mouth daily.     Carboxymethylcellul-Glycerin (LUBRICATING EYE DROPS OP) Place 1 drop into both eyes daily as needed (dry eyes).     cyclobenzaprine (FLEXERIL) 10 MG tablet Take 10 mg by mouth at bedtime as needed for muscle spasms.   5   denosumab (PROLIA) 60 MG/ML SOSY injection Inject 60 mg into the skin every 6 (six) months.     furosemide (LASIX) 20 MG tablet Take 1 tablet (20 mg total) by mouth daily. 90 tablet 3   levothyroxine (SYNTHROID) 75 MCG tablet Take 1 tablet (75 mcg total) by mouth daily. 90 tablet 1   meclizine (ANTIVERT) 25 MG tablet TAKE 1 TABLET BY MOUTH THREE TIMES DAILY AS NEEDED FOR DIZZINESS 30 tablet 0   metoprolol tartrate (LOPRESSOR) 50 MG tablet Take 1 tablet by mouth twice daily 180 tablet 3   Nutritional Supplements (,FEEDING SUPPLEMENT, PROSOURCE PLUS) liquid Take 30 mLs by mouth  2 (two) times daily between meals. 887 mL 0   nystatin (MYCOSTATIN/NYSTOP) powder Apply topically 2 (two) times daily as needed (skin irritation (under breasts)). 15 g 0   ondansetron (ZOFRAN) 4 MG tablet Take 4 mg by mouth every 8 (eight) hours as needed.     oxyCODONE (ROXICODONE) 5 MG/5ML solution Take 4 mLs (4 mg total) by mouth every 4 (four) hours as needed for moderate pain.  0   polyethylene glycol (MIRALAX / GLYCOLAX) packet Take 17 g by mouth daily as needed for mild constipation.     sodium chloride (OCEAN) 0.65 % SOLN nasal spray Place 1 spray into both nostrils  as needed for congestion (nose bleeds).     triamcinolone ointment (KENALOG) 0.1 % Apply 1 application topically 2 (two) times daily. 90 g 1   valACYclovir (VALTREX) 500 MG tablet Take 500 mg by mouth daily as needed (breakouts).      XARELTO 20 MG TABS tablet TAKE 1 TABLET BY MOUTH ONCE DAILY WITH SUPPER 30 tablet 6   No current facility-administered medications for this visit.     Diagnostic Tests:  ECHOCARDIOGRAM LIMITED REPORT         Patient Name:   [name]   Date of Exam: 10/19/2020  Medical Rec #:  [id number]     Height:       66.0 in  Accession #:    [id number]    Weight:       121.4 lb  Date of Birth:  1946/06/06     BSA:          1.617 m  Patient Age:    75 years      BP:           120/75 mmHg  Patient Gender: F             HR:           71 bpm.  Exam Location:  [address]   Procedure: 2D Echo, Limited Color Doppler and Cardiac Doppler   Indications:    Z98.89 Status post MVR                  Limited evaluation of MVR     History:        Patient has prior history of Echocardiogram examinations.  DCCV.                  Status post MAZE procedure; Mitral Valve Disease. Status  post                  MVR complicated with LV free wall laceration requiring  median                  sternotomy. Status post Gore-tex neochord x 12.                     Mitral Valve: 32 mm sorin Memo 4D prosthetic annuloplasty  ring                  valve is present in the mitral position.     Sonographer:    Lenard Galloway [facility], RDCS  Referring Phys: [id number] [place]     1. Left ventricular ejection fraction, by estimation, is 50 to 55%. The  left ventricle has low normal function. The left ventricle has no regional  wall motion abnormalities.   2. The right ventricular size is mildly enlarged.  3. The mitral valve has been repaired/replaced. Trivial mitral valve  regurgitation. There is a 32 mm sorin Memo 4D prosthetic annuloplasty ring  present in the  mitral position. Mean gradient 44mmHg at HR 67bpm. Procedure  Date: 01/17/20.   4. The aortic valve is tricuspid. There is mild calcification of the  aortic valve. There is mild thickening of the aortic valve. Aortic valve  regurgitation is mild. Mild aortic valve sclerosis is present, with no  evidence of aortic valve stenosis.   5. The inferior vena cava is dilated in size with <50% respiratory  variability, suggesting right atrial pressure of 15 mmHg.   Comparison(s): Compared to prior echo on 02/2020, the LVEF is slightly  lower at 50-55% (still within normal range). Mean gradient across the  mitral valve remains stable at 58mmHg (previoulsy 27mmHg). RAP is now higher  at 49mmHg as well.   FINDINGS   Left Ventricle: Left ventricular ejection fraction, by estimation, is 50  to 55%. The left ventricle has low normal function. The left ventricle has  no regional wall motion abnormalities. The left ventricular internal  cavity size was normal in size.  There is no left ventricular hypertrophy. Abnormal (paradoxical) septal  motion consistent with post-operative status.   Right Ventricle: The right ventricular size is mildly enlarged.   Pericardium: There is no evidence of pericardial effusion.   Mitral Valve: The mitral valve has been repaired/replaced. Trivial mitral  valve regurgitation. There is a 32 mm sorin Memo 4D prosthetic  annuloplasty ring present in the mitral position. MV peak gradient, 7.8  mmHg. The mean mitral valve gradient is 2.5  mmHg.   Tricuspid Valve: The tricuspid valve is normal in structure. Tricuspid  valve regurgitation is mild.   Aortic Valve: The aortic valve is tricuspid. There is mild calcification  of the aortic valve. There is mild thickening of the aortic valve. Aortic  valve regurgitation is mild. Mild aortic valve sclerosis is present, with  no evidence of aortic valve  stenosis.   Pulmonic Valve: The pulmonic valve was normal in structure.  Pulmonic valve  regurgitation is trivial.   Aorta: The aortic root and ascending aorta are structurally normal, with  no evidence of dilitation.   Venous: The inferior vena cava is dilated in size with less than 50%  respiratory variability, suggesting right atrial pressure of 15 mmHg.   IAS/Shunts: No atrial level shunt detected by color flow Doppler.   LEFT VENTRICLE  PLAX 2D  LVIDd:         4.10 cm  LVIDs:         3.30 cm  LV PW:         1.00 cm  LV IVS:        0.80 cm  LVOT diam:     2.00 cm  LV SV:         43  LV SV Index:   27  LVOT Area:     3.14 cm      RIGHT VENTRICLE            IVC  RVSP:           40.2 mmHg  IVC diam: 2.10 cm   LEFT ATRIUM         Index      RIGHT ATRIUM  LA diam:    3.55 cm 2.19 cm/m RA Pressure: 3.00 mmHg   AORTIC VALVE  LVOT Vmax:   69.50 cm/s  LVOT Vmean:  48.700 cm/s  LVOT VTI:    0.138 m     AORTA  Ao Root diam: 3.05 cm  Ao Asc diam:  3.40 cm   MITRAL VALVE                TRICUSPID VALVE  MV Area (PHT): 4.41 cm     TR Peak grad:   37.2 mmHg  MV Area VTI:   1.27 cm     TR Vmax:        305.00 cm/s  MV Peak grad:  7.8 mmHg     Estimated RAP:  3.00 mmHg  MV Mean grad:  2.5 mmHg     RVSP:           40.2 mmHg  MV Vmax:       1.40 m/s  MV Vmean:      74.4 cm/s    SHUNTS  MV Decel Time: 172 msec     Systemic VTI:  0.14 m  MV E velocity: 137.00 cm/s  Systemic Diam: 2.00 cm  MV A velocity: 51.90 cm/s  MV E/A ratio:  2.64   [name] MD  Electronically signed by [name] MD  Signature Date/Time: 10/19/2020/1:58:11 PM       Impression:  Patient seems to be doing well from a cardiac standpoint now more than 1 year status post mitral valve repair and Maze procedure.  She did have increased shortness of breath and fluid overload while she was off of Lasix but it sounds as though she is doing better since Lasix has been restarted.  It is not surprising that she would still have some degree of chronic diastolic heart  failure despite the fact that her valve repair looks good and LV systolic function is normal.   Plan:  I have discussed the results of the patient's multiple postoperative echocardiograms including the one performed most recently last April.  I reassured the patient that from a cardiac standpoint she seems to be doing remarkably well, and it is not surprising that given her history of diastolic heart failure, deconditioning, and frail physical stature that she might continue to require at least low-dose diuretic therapy indefinitely.  I have reassured her that what she describes about the bony prominence along her chest wall is not unexpected.  We did not use sternal wire when we closed her sternum, although she does have fiber tape which is nonabsorbable.  All of the suture material that was used is absorbable.  Prominence of her bones along the anterior chest is absolutely expected given her extremely frail stature and low body weight.  The patient has been reminded regarding the importance of dental hygiene and the lifelong need for antibiotic prophylaxis for all dental cleanings and other related invasive procedures.  The patient will continue to follow-up intermittently with Dr. Johney Frame and call this office in the future only should specific problems or questions arise.   I discussed limitations of evaluation and management via telephone.  The patient was advised to call back for repeat telephone consultation or to seek an in-person evaluation if questions arise or the patient's clinical condition changes in any significant manner.  I spent in excess of 10 minutes of non-face-to-face time during the conduct of this telephone virtual office consultation, including pre-visit review of the patient's records and direct conversation with the patient.      Valentina Gu. Roxy Manns, MD 01/15/2021 3:23 PM

## 2021-01-15 NOTE — Patient Instructions (Signed)
Follow up as needed or as scheduled Apply the Triamcinolone ointment twice daily Please let me know if things are spreading or worsening Call with any questions or concerns Enjoy the rest of your summer!!!

## 2021-01-15 NOTE — Progress Notes (Signed)
   Subjective:    Patient ID: [name], female    DOB: February 05, 1946, 75 y.o.   MRN: [id number]  HPI Rash- 'it's itching me to death'.  L forearm, L iliac crest.  Appeared late last week.  Pt was outside working on her tomato plants but isn't sure if this is what caused her itching or if she was bit by something.  Pt had home sprayed for bugs just in case.     Review of Systems For ROS see HPI   This visit occurred during the SARS-CoV-2 public health emergency.  Safety protocols were in place, including screening questions prior to the visit, additional usage of staff PPE, and extensive cleaning of exam room while observing appropriate contact time as indicated for disinfecting solutions.      Objective:   Physical Exam Vitals reviewed.  Constitutional:      General: She is not in acute distress.    Appearance: She is not ill-appearing.  HENT:     Head: Normocephalic and atraumatic.  Skin:    General: Skin is warm and dry.     Findings: Rash (mildly erythematous rash on L forearm and L iliac crest consistent w/ contact dermatitis.  Not consistent w/ shingles) present.  Neurological:     Mental Status: She is alert and oriented to person, place, and time. Mental status is at baseline.  Psychiatric:     Comments: anxious          Assessment & Plan:  Contact dermatitis- new.  Pt was reassured that this was not consistent w/ shingles (as she fears) and does not appear to be bed bugs or scabies (another concern).  This is consistent w/ plant dermatitis and likely occurred while she was gardening.  Given her underlying Afib and anxiety, will not use systemic steroids but rather topical Triamcinolone since it is a small surface area.  She is to take Claritin or Zyrtec to help w/ itching.  Reviewed supportive care and red flags that should prompt return. Pt expressed understanding and is in agreement w/ plan.

## 2021-01-15 NOTE — Patient Instructions (Signed)
Endocarditis is a potentially serious infection of heart valves or inside lining of the heart.  It occurs more commonly in patients with diseased heart valves (such as patient's with aortic or mitral valve disease) and in patients who have undergone heart valve repair or replacement.  Certain surgical and dental procedures may put you at risk, such as dental cleaning, other dental procedures, or any surgery involving the respiratory, urinary, gastrointestinal tract, gallbladder or prostate gland.   To minimize your chances for develooping endocarditis, maintain good oral health and seek prompt medical attention for any infections involving the mouth, teeth, gums, skin or urinary tract.    Always notify your doctor or dentist about your underlying heart valve condition before having any invasive procedures. You will need to take antibiotics before certain procedures, including all routine dental cleanings or other dental procedures.  Your cardiologist or dentist should prescribe these antibiotics for you to be taken ahead of time.       

## 2021-01-15 NOTE — Telephone Encounter (Signed)
Call made to patient, confirmed DOB. Patient is going to call her insurance and wanted to confirm the medication name. Made aware the medication is Albuterol.   Nothing further needed at this time.

## 2021-01-15 NOTE — Telephone Encounter (Signed)
PFTs showed normal pulmonary function, unchanged from before. NO effect of scoliosis and spine surgery on her lung function. At the most I would offer her an albuterol rescue inhaler if she would like to try that to see if it improves her breathing, to use as needed.

## 2021-01-15 NOTE — Telephone Encounter (Signed)
Called and spoke with Patient. Dr. Shearon Stalls results and recommendations given. Understanding stated. Patient stated she would call back if she decides for albuterol inhaler.  Patient stated she wanted to check on cost with insurance, before requesting albuterol prescription.

## 2021-01-16 DIAGNOSIS — M961 Postlaminectomy syndrome, not elsewhere classified: Secondary | ICD-10-CM | POA: Diagnosis not present

## 2021-01-16 DIAGNOSIS — Z79891 Long term (current) use of opiate analgesic: Secondary | ICD-10-CM | POA: Diagnosis not present

## 2021-01-16 DIAGNOSIS — M5412 Radiculopathy, cervical region: Secondary | ICD-10-CM | POA: Diagnosis not present

## 2021-01-16 DIAGNOSIS — G894 Chronic pain syndrome: Secondary | ICD-10-CM | POA: Diagnosis not present

## 2021-01-16 DIAGNOSIS — M4693 Unspecified inflammatory spondylopathy, cervicothoracic region: Secondary | ICD-10-CM | POA: Diagnosis not present

## 2021-01-17 ENCOUNTER — Encounter: Payer: Self-pay | Admitting: Family Medicine

## 2021-01-17 ENCOUNTER — Emergency Department (INDEPENDENT_AMBULATORY_CARE_PROVIDER_SITE_OTHER)
Admission: EM | Admit: 2021-01-17 | Discharge: 2021-01-17 | Disposition: A | Discharge status: Home / Self Care | Payer: Medicare Other | Source: Home / Self Care

## 2021-01-17 ENCOUNTER — Other Ambulatory Visit: Payer: Self-pay

## 2021-01-17 DIAGNOSIS — R21 Rash and other nonspecific skin eruption: Secondary | ICD-10-CM | POA: Diagnosis not present

## 2021-01-17 DIAGNOSIS — L239 Allergic contact dermatitis, unspecified cause: Secondary | ICD-10-CM

## 2021-01-17 NOTE — ED Provider Notes (Signed)
[name]    CSN: [id number] Arrival date & time: 01/17/21  1510      History   Chief Complaint Chief Complaint  Patient presents with   Rash    HPI [name] is a 75 y.o. female.   HPI  Patient is known to me from prior visits.  She is here today because of a rash.  She states she saw her primary care doctor on 01/15/2021.  Was diagnosed with contact dermatitis.  Was treated with triamcinolone ointment twice a day.  Since then she feels like her rash spread.  In addition to the rash on her forearm, she has rash on her left buttock and upper thigh.  She is very worried that she may have shingles.  She has had shingles before.  She has had shingles vaccination.  She is unable to see the rash on her backside very well also is here for evaluation  Patient is very reluctant to take any oral medications because of her history of multiple drug intolerances and allergies.  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   "right"   Mitral regurgitation    Osteopenia 02/18/2012   Osteoporosis    PAF (paroxysmal atrial fibrillation) ([place]) 2012   Peripheral neuropathy 11/06/2011   PMDD (premenstrual dysphoric disorder)    PONV (postoperative nausea and vomiting)    nausea, vomiting, hives and dizziness    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/2020   Complete bilateral atrial lesion set using cryothermy and bipolar radiofrequency ablation with clipping of LA  appendage via right mini-thoracotomy approach   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   Complex valvuloplasty including artificial Gore-tex neochord placement x12 with 72mm Sorin Memo 4D ring annuloplasty   Seasonal allergies    Vaginal delivery    ONE NSVD   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    Patient Active Problem List   Diagnosis Date Noted   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   Atrial fibrillation ([place]) 02/09/2020   Long term (current) use of anticoagulants 02/09/2020   Acute blood loss anemia 02/09/2020   Debility 02/02/2020   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/2020   Severe mitral regurgitation    Mitral regurgitation due to cusp prolapse    Ankle swelling, left 10/20/2019   Ankle swelling, right 10/20/2019   Secondary hypercoagulable state ([place]) 08/19/2019   Left inguinal hernia 03/11/2019   Visit for monitoring Tikosyn therapy 03/29/2018   Osteoporosis 09/23/2017   Fatigue 02/18/2017   Physical exam 04/21/2016   Positional lightheadedness 11/16/2015   B12 deficiency 10/01/2015   Hearing loss due to cerumen impaction 08/23/2015   Foot pain, right 05/23/2015   Hyperlipidemia 04/19/2015   Protein-calorie malnutrition ([place]) 10/19/2014   Fracture of multiple ribs 08/07/2014   Tachycardia    Shortness of breath 08/06/2014   Constipation 07/14/2014   Vitamin B12 deficiency  anemia, unspecified 06/19/2014   Hypokalemia 06/19/2014   Other chronic pain 06/19/2014   Polyneuropathy, unspecified 06/19/2014   Zoster without complications 38/25/0539   Vitreous degeneration, bilateral 06/19/2014   Nutritional deficiency, unspecified 06/19/2014   Nocturia 76/73/4193   Other symbolic dysfunctions 79/08/4095   Unsteadiness on feet 06/19/2014   Unspecified osteoarthritis, unspecified site 06/19/2014   Muscle weakness (generalized) 06/19/2014   Major depressive disorder, single episode, unspecified 06/19/2014   Frequency of micturition 06/19/2014    Encounter for orthopedic aftercare following scoliosis surgery 06/19/2014   Cardiac arrhythmia, unspecified 06/19/2014   Age-related nuclear cataract, unspecified eye 06/19/2014   Scoliosis, unspecified 06/19/2014   Lumbar back pain 05/19/2012   Anxiety 02/18/2012   Ureteric stone 02/18/2012   Leg pain, bilateral 11/17/2011   Hereditary and idiopathic peripheral neuropathy 11/06/2011   S/P cervical spinal fusion 11/06/2011   Gastro-esophageal reflux disease without esophagitis 11/06/2011   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 11/06/2011   Personal history of other diseases of the circulatory system 11/06/2011   Allergic rhinitis, unspecified 04/25/2010   Vulvodynia, 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   Anxiety and depression 03/04/2010   Other malaise and fatigue 02/05/2010   Vitamin D deficiency 01/28/2010   Neoplasm of uncertain behavior of skin 01/25/2010   Genital herpes 11/01/2009   Herpes simplex virus (HSV) infection 11/01/2009   Herpetic vulvovaginitis 10/23/2009   Kidney stone 08/09/2009   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   Brachial neuritis or radiculitis 07/23/2009    Past Surgical History:  Procedure Laterality Date   ANTERIOR CERVICAL DECOMP/DISCECTOMY FUSION  ~ 2003   BACK SURGERY     BREAST SURGERY     BREAST BIOPSY--RIGHT BENIGN   BUNIONECTOMY Bilateral    CARDIOVERSION N/A 01/26/2020   Procedure: CARDIOVERSION;  Surgeon: Geralynn Rile, MD;  Location: [facility];  Service: Cardiovascular;  Laterality: N/A;   COSMETIC SURGERY  2016   "back of my neck; related to earlier fusion"   [place]  "several times"   [place] Right    "removed bone protruding out of my forehead"   HARDWARE REMOVAL  2016   "related to neck OR"   INCONTINENCE SURGERY     IR THORACENTESIS ASP PLEURAL SPACE W/IMG GUIDE  02/16/2020   MINIMALLY INVASIVE MAZE PROCEDURE N/A 01/17/2020    Procedure: MINIMALLY INVASIVE MAZE PROCEDURE;  Surgeon: Rexene Alberts, MD;  Location: [facility];  Service: Open Heart Surgery;  Laterality: N/A;   MITRAL VALVE REPAIR Right 01/17/2020   Procedure: MINIMALLY INVASIVE MITRAL VALVE REPAIR (MVR) USING MEMO 4D 32MM;  Surgeon: Rexene Alberts, MD;  Location: [place] [facility];  Service: Open Heart Surgery;  Laterality: Right;   POSTERIOR CERVICAL FUSION/FORAMINOTOMY  ~ 2008; 2015   RIGHT/LEFT HEART CATH AND CORONARY ANGIOGRAPHY N/A 12/02/2019   Procedure: RIGHT/LEFT HEART CATH AND CORONARY ANGIOGRAPHY;  Surgeon: Burnell Blanks, MD;  Location: [facility];  Service: Cardiovascular;  Laterality: N/A;   SHOULDER ARTHROSCOPY W/ ROTATOR CUFF REPAIR Right 2012   SPINAL FUSION  06/2014 - 2018 X ?7   "scoliosis; my entire back"   TEE WITHOUT CARDIOVERSION N/A 11/21/2019   Procedure: TRANSESOPHAGEAL ECHOCARDIOGRAM (TEE);  Surgeon: Josue Hector, MD;  [facility];  Service: Cardiovascular;  Laterality: N/A;   TEE WITHOUT CARDIOVERSION N/A 01/17/2020   Procedure: TRANSESOPHAGEAL ECHOCARDIOGRAM (TEE);  Surgeon: Rexene Alberts, MD;  Location: [place];  Service: Open Heart Surgery;  Laterality: N/A;  TEE WITHOUT CARDIOVERSION N/A 01/26/2020   Procedure: TRANSESOPHAGEAL ECHOCARDIOGRAM (TEE);  Surgeon: Geralynn Rile, MD;  Location: [place];  Service: Cardiovascular;  Laterality: N/A;   TUBAL LIGATION     VAGINAL HYSTERECTOMY     TVH    OB History     Gravida  1   Para  1   Term  1   Preterm      AB      Living  1      SAB      IAB      Ectopic      Multiple      Live Births  1            Home Medications    Prior to Admission medications   Medication Sig Start Date End Date Taking? Authorizing Provider  acetaminophen (TYLENOL) 500 MG tablet Take 500 mg by mouth every 6 (six) hours as needed for moderate pain.     [provider]  b complex vitamins tablet Take 1 tablet by mouth daily.    [provider]  Calcium Carbonate Antacid (TUMS CHEWY BITES PO) Take 1 tablet by mouth daily as needed (reflux).     [provider]  Calcium Citrate-Vitamin D (CALCIUM + D PO) Take 1 tablet by mouth daily.    [provider]  Carboxymethylcellul-Glycerin (LUBRICATING EYE DROPS OP) Place 1 drop into both eyes daily as needed (dry eyes).    [provider]  cyclobenzaprine (FLEXERIL) 10 MG tablet Take 10 mg by mouth at bedtime as needed for muscle spasms.  12/10/17   [provider]  denosumab (PROLIA) 60 MG/ML SOSY injection Inject 60 mg into the skin every 6 (six) months.    [provider]  EUTHYROX 75 MCG tablet Take 1 tablet by mouth once daily 01/16/21   [name], MD  furosemide (LASIX) 20 MG tablet Take 1 tablet (20 mg total) by mouth daily. 10/24/20   Freada Bergeron, MD  meclizine (ANTIVERT) 25 MG tablet TAKE 1 TABLET BY MOUTH THREE TIMES DAILY AS NEEDED FOR DIZZINESS 10/01/20   [name], MD  metoprolol tartrate (LOPRESSOR) 50 MG tablet Take 1 tablet by mouth twice daily 06/15/20   Camnitz, Ocie Doyne, MD  Nutritional Supplements (,FEEDING SUPPLEMENT, PROSOURCE PLUS) liquid Take 30 mLs by mouth 2 (two) times daily between meals. 02/08/20   Love, Ivan Anchors, PA-C  nystatin (MYCOSTATIN/NYSTOP) powder Apply topically 2 (two) times daily as needed (skin irritation (under breasts)). 02/08/20   Love, Ivan Anchors, PA-C  ondansetron (ZOFRAN) 4 MG tablet Take 4 mg by mouth every 8 (eight) hours as needed. 05/22/20   [provider]  oxyCODONE (ROXICODONE) 5 MG/5ML solution Take 4 mLs (4 mg total) by mouth every 4 (four) hours as needed for moderate pain. 02/08/20   Love, Ivan Anchors, PA-C  polyethylene glycol (MIRALAX / GLYCOLAX) packet Take 17 g by mouth daily as needed for mild constipation.    [provider]  sodium chloride (OCEAN) 0.65 % SOLN nasal spray Place 1 spray into both nostrils as needed for congestion (nose bleeds).     [provider]  triamcinolone ointment (KENALOG) 0.1 % Apply 1 application topically 2 (two) times daily. 01/15/21 01/15/22  [name], MD  valACYclovir (VALTREX) 500 MG tablet Take 500 mg by mouth daily as needed (breakouts).  07/15/19   [provider]  XARELTO 20 MG TABS tablet TAKE 1 TABLET BY MOUTH  ONCE DAILY WITH SUPPER 11/12/20   Camnitz, Ocie Doyne, MD    Family History Family History  Problem Relation Age of Onset   Diabetes Father    Hyperlipidemia Sister    Heart disease Sister    Stroke Sister    Diabetes Brother    Hyperlipidemia Sister    Heart disease Sister    Arthritis Mother    Heart disease Mother    Uterine cancer Mother    Diabetes Brother    Heart Problems Brother     Social History Social History   Tobacco Use   Smoking status: Former    Packs/day: 0.10    Years: 14.00    Pack years: 1.40    Types: Cigarettes    Quit date: 1980    Years since quitting: 42.5   Smokeless tobacco: Never   Tobacco comments:    03/31/2018 "quit ~ 1980; someday smoker when I did smoke; never addicted"  Vaping Use   Vaping Use: Never used  Substance Use Topics   Alcohol use: Never   Drug use: Yes    Types: Oxycodone, Benzodiazepines    Comment: 03/31/2018 "for chronic neck and back pain", takes Klonopin at times.      Allergies   Buprenorphine, Cymbalta [duloxetine hcl], Erythromycin, Hydrocodone, Hydromorphone, Iodinated diagnostic agents, Iodine, Levofloxacin, Metrizamide, Nsaids, Nucynta [tapentadol hcl], Nucynta [tapentadol], Other, Oxycodone, Pentazocine, Septra [bactrim], Sulfasalazine, Adhesive [tape], Latex, Morphine, and Sulfa antibiotics   Review of Systems Review of Systems See HPI  Physical Exam Triage Vital Signs ED Triage Vitals [01/17/21 1533]  Enc Vitals Group     BP 126/76     Pulse Rate 78     Resp 18     Temp 97.9 F (36.6 C)     Temp Source Oral     SpO2 93 %     Weight      Height      Head Circumference       Peak Flow      Pain Score 2     Pain Loc      Pain Edu?      Excl. in [place]?    No data found.  Updated Vital Signs BP 126/76 (BP Location: Right Arm)   Pulse 78   Temp 97.9 F (36.6 C) (Oral)   Resp 18   SpO2 93%      Physical Exam Constitutional:      General: She is not in acute distress.    Appearance: Normal appearance. She is well-developed.     Comments: Patient is thin.  Slightly hard of hearing.  Normal conversation  HENT:     Head: Normocephalic and atraumatic.     Mouth/Throat:     Comments: Mask is in place Eyes:     Conjunctiva/sclera: Conjunctivae normal.     Pupils: Pupils are equal, round, and reactive to light.  Cardiovascular:     Rate and Rhythm: Normal rate.  Pulmonary:     Effort: Pulmonary effort is normal. No respiratory distress.  Abdominal:     General: There is no distension.     Palpations: Abdomen is soft.  Musculoskeletal:        General: Normal range of motion.     Cervical back: Normal range of motion.  Skin:    General: Skin is warm and dry.     Findings: Rash present.     Comments: There is streaks of vesicular rash on the volar aspect of the left forearm.  They appear as typical contact dermatitis.  No redness tenderness or drainage.  On the left buttock there is a small area of rash that looks like dry skin.  Below this there are some streaks on the upper thigh that look like they could be from bruising.  There is no vesicular or papular appearance  Neurological:     Mental Status: She is alert.   In addition patient has a small stitch protruding from her central sternotomy scar.  I cleaned it with alcohol and took a pair of tweezers to remove this.  It pulled out easily.  Antibiotic ointment placed  UC Treatments / Results  Labs (all labs ordered are listed, but only abnormal results are displayed) Labs Reviewed - No data to display  EKG   Radiology No results found.  Procedures Procedures (including critical care  time)  Medications Ordered in UC Medications - No data to display  Initial Impression / Assessment and Plan / UC Course  I have reviewed the triage vital signs and the nursing notes.  Pertinent labs & imaging results that were available during my care of the patient were reviewed by me and considered in my medical decision making (see chart for details).     Patient was mostly worried she had shingles on her backside.  I told her that this was not looking like shingles.  She has had shingles before and has had shingles vaccination so hopefully will not get it again.  The rash on her forearm does look like a contact dermatitis.  I recommend she continue the triamcinolone. Follow-up with PCP  Final Clinical Impressions(s) / UC Diagnoses   Final diagnoses:  Allergic contact dermatitis, unspecified trigger  Rash     Discharge Instructions      Continue the triamcinolone ointment 2 times a day Cool compresses for itching or discomfort I see no evidence of shingles Call your doctor for follow-up   ED Prescriptions   None    PDMP not reviewed this encounter.   Raylene Everts, MD 01/17/21 [phone]

## 2021-01-17 NOTE — Discharge Instructions (Addendum)
Continue the triamcinolone ointment 2 times a day Cool compresses for itching or discomfort I see no evidence of shingles Call your doctor for follow-up

## 2021-01-17 NOTE — ED Triage Notes (Signed)
Pt c/o rash since middle of last week. Says she was outside working in her garden. Saw pcp yesterday but rash wasn't as bad. Worsening today. Spot on buttocks has shown up today. Was rxd a steroid cream. Hx of shingles.

## 2021-01-18 ENCOUNTER — Ambulatory Visit: Payer: Medicare Other | Admitting: Registered Nurse

## 2021-01-21 NOTE — Progress Notes (Signed)
Cardiology Office Note:    Date:  01/23/2021   ID:  Bayyinah, Dukeman 1946-03-06, MRN [id number]  PCP:  [name], MD   [facility] Providers Cardiologist:  Freada Bergeron, MD Electrophysiologist:  Constance Haw, MD {   Referring MD: [name], MD     History of Present Illness:    [name] is a 75 y.o. female with a hx of severe MR s/p MV repair (32 mm Sorin Memo 4D ring annuloplasty valve) complicated by LV free wall laceration requiring median sternotomy, pAfib, scoliosis requiring multiple spine surgeries and anxiety who presents to clinic for follow-up.   TEE on 11/21/19 demonstrated presence of mitral valve prolapse with a large flail segment felt to be the A3 portion of the anterior leaflet with severe mitral regurgitation. She was seen by Dr. Ricard Dillon and underwent Minimally Invasive Mitral Valve Repair, Complete MAZE procedure, and Sternotomy with repair of Left Ventricular Free- wall laceration on 01/17/20.  She was extubated the evening of surgery. She was started on coumadin for her Mitral Valve Repair.  Her post-op course was complicated by acute blood loss anemia requiring blood transfusions and Afib with RVR for which she was placed on Amiodarone and lopressor and her tikosyn was discontinued. She follows with [name] in clinic.   Seen in clinic 08/23/20 where she was having right sided chest pain/burning where her incision site was located. She was very concerned that it may be related to her heart or breast cancer. Given localization around scar tissue site, nonexertional nature, and no masses, suspected it was likely related to neuropathic pain and scar tissue. CTA, chest xray, d-dimer all reassuringly normal. Given her significant allergies to medications, we recommended she follow-up with her chronic pain physician.  Saw Dr. Roxy Manns on 01/15/21 where she was deemed to be stable from a CV standpoint.  Today, the patient states that she is  overall doing okay. Has been taking the lasix 20mg  daily which has helped maintain euvolemia. Continues to have some shortness of breath intermittently with  "good days and bad days." Continues to struggle with depression as she is in chronic pain. She is planned for intervention for chronic venous stasis changes. This is to be performed at [facility]. She is hoping this will help her edema and leg pain.    Past Medical History:  Diagnosis Date   Allergies    Anxiety    Arthritis    "maybe in my back" (03/31/2018)   Benign paroxysmal positional vertigo 96/78/9381   Complication of anesthesia    Fracture of multiple ribs 2015   "don't know from what; dx'd when I in hospital for 1st back OR" (03/31/2018)   GERD (gastroesophageal reflux disease)    Hair loss 04/12/2012   Herpes    History of blood transfusion    "twice; related to back OR" (03/31/2018)   History of kidney stones    Interstitial cystitis 11/06/2011   Melanoma of ankle ([place]) ~ 2003   "right"   Mitral regurgitation    Osteopenia 02/18/2012   Osteoporosis    PAF (paroxysmal atrial fibrillation) ([place]) 2012   Peripheral neuropathy 11/06/2011   PMDD (premenstrual dysphoric disorder)    PONV (postoperative nausea and vomiting)    nausea, vomiting, hives and dizziness    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/2020   Complete bilateral atrial lesion set using cryothermy and bipolar radiofrequency ablation with clipping of LA appendage via right mini-thoracotomy approach  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   Complex valvuloplasty including artificial Gore-tex neochord placement x12 with 52mm Sorin Memo 4D ring annuloplasty   Seasonal allergies    Vaginal delivery    ONE NSVD   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    Past Surgical History:  Procedure Laterality Date   ANTERIOR CERVICAL DECOMP/DISCECTOMY FUSION  ~ 2003   BACK SURGERY     BREAST SURGERY     BREAST BIOPSY--RIGHT BENIGN   BUNIONECTOMY Bilateral    CARDIOVERSION N/A  01/26/2020   Procedure: CARDIOVERSION;  Surgeon: Geralynn Rile, MD;  Location: [place];  Service: Cardiovascular;  Laterality: N/A;   COSMETIC SURGERY  2016   "back of my neck; related to earlier fusion"   [place]  "several times"   [place] Right    "removed bone protruding out of my forehead"   HARDWARE REMOVAL  2016   "related to neck OR"   INCONTINENCE SURGERY     IR THORACENTESIS ASP PLEURAL SPACE W/IMG GUIDE  02/16/2020   MINIMALLY INVASIVE MAZE PROCEDURE N/A 01/17/2020   Procedure: MINIMALLY INVASIVE MAZE PROCEDURE;  Surgeon: Rexene Alberts, MD;  Location: [place];  Service: Open Heart Surgery;  Laterality: N/A;   MITRAL VALVE REPAIR Right 01/17/2020   Procedure: MINIMALLY INVASIVE MITRAL VALVE REPAIR (MVR) USING MEMO 4D 32MM;  Surgeon: Rexene Alberts, MD;  Location: [place];  Service: Open Heart Surgery;  Laterality: Right;   POSTERIOR CERVICAL FUSION/FORAMINOTOMY  ~ 2008; 2015   RIGHT/LEFT HEART CATH AND CORONARY ANGIOGRAPHY N/A 12/02/2019   Procedure: RIGHT/LEFT HEART CATH AND CORONARY ANGIOGRAPHY;  Surgeon: Burnell Blanks, MD;  Location: [place] CV [facility];  Service: Cardiovascular;  Laterality: N/A;   SHOULDER ARTHROSCOPY W/ ROTATOR CUFF REPAIR Right 2012   SPINAL FUSION  06/2014 - 2018 X ?7   "scoliosis; my entire back"   TEE WITHOUT CARDIOVERSION N/A 11/21/2019   Procedure: TRANSESOPHAGEAL ECHOCARDIOGRAM (TEE);  Surgeon: Josue Hector, MD;  Location: [facility] ENDOSCOPY;  Service: Cardiovascular;  Laterality: N/A;   TEE WITHOUT CARDIOVERSION N/A 01/17/2020   Procedure: TRANSESOPHAGEAL ECHOCARDIOGRAM (TEE);  Surgeon: Rexene Alberts, MD;  Location: [place];  Service: Open Heart Surgery;  Laterality: N/A;   TEE WITHOUT CARDIOVERSION N/A 01/26/2020   Procedure: TRANSESOPHAGEAL ECHOCARDIOGRAM (TEE);  Surgeon: Geralynn Rile, MD;  Location: [place];  Service: Cardiovascular;   Laterality: N/A;   TUBAL LIGATION     VAGINAL HYSTERECTOMY     TVH    Current Medications: Current Meds  Medication Sig   acetaminophen (TYLENOL) 500 MG tablet Take 500 mg by mouth every 6 (six) hours as needed for moderate pain.    b complex vitamins tablet Take 1 tablet by mouth daily.   Calcium Carbonate Antacid (TUMS CHEWY BITES PO) Take 1 tablet by mouth daily as needed (reflux).    Calcium Citrate-Vitamin D (CALCIUM + D PO) Take 1 tablet by mouth daily.   Carboxymethylcellul-Glycerin (LUBRICATING EYE DROPS OP) Place 1 drop into both eyes daily as needed (dry eyes).   cyclobenzaprine (FLEXERIL) 10 MG tablet Take 10 mg by mouth at bedtime as needed for muscle spasms.    denosumab (PROLIA) 60 MG/ML SOSY injection Inject 60 mg into the skin every 6 (six) months.   EUTHYROX 75 MCG tablet Take 1 tablet by mouth once daily   meclizine (ANTIVERT) 25 MG tablet TAKE 1 TABLET BY MOUTH THREE TIMES DAILY AS NEEDED FOR DIZZINESS  Nutritional Supplements (,FEEDING SUPPLEMENT, PROSOURCE PLUS) liquid Take 30 mLs by mouth 2 (two) times daily between meals.   nystatin (MYCOSTATIN/NYSTOP) powder Apply topically 2 (two) times daily as needed (skin irritation (under breasts)).   ondansetron (ZOFRAN) 4 MG tablet Take 4 mg by mouth every 8 (eight) hours as needed.   oxyCODONE (ROXICODONE) 5 MG/5ML solution Take 4 mLs (4 mg total) by mouth every 4 (four) hours as needed for moderate pain.   polyethylene glycol (MIRALAX / GLYCOLAX) packet Take 17 g by mouth daily as needed for mild constipation.   sodium chloride (OCEAN) 0.65 % SOLN nasal spray Place 1 spray into both nostrils as needed for congestion (nose bleeds).   triamcinolone ointment (KENALOG) 0.1 % Apply 1 application topically 2 (two) times daily.   valACYclovir (VALTREX) 500 MG tablet Take 500 mg by mouth daily as needed (breakouts).    [DISCONTINUED] furosemide (LASIX) 20 MG tablet Take 1 tablet (20 mg total) by mouth daily.   [DISCONTINUED]  metoprolol tartrate (LOPRESSOR) 50 MG tablet Take 1 tablet by mouth twice daily   [DISCONTINUED] XARELTO 20 MG TABS tablet TAKE 1 TABLET BY MOUTH ONCE DAILY WITH SUPPER     Allergies:   Buprenorphine, Cymbalta [duloxetine hcl], Erythromycin, Hydrocodone, Hydromorphone, Iodinated diagnostic agents, Iodine, Levofloxacin, Metrizamide, Nsaids, Nucynta [tapentadol hcl], Nucynta [tapentadol], Other, Oxycodone, Pentazocine, Septra [bactrim], Sulfasalazine, Adhesive [tape], Latex, Morphine, and Sulfa antibiotics   Social History   Socioeconomic History   Marital status: Divorced    Spouse name: none/divorced   Number of children: Not on file   Years of education: Not on file   Highest education level: 12th grade  Occupational History   Occupation: retired  Tobacco Use   Smoking status: Former    Packs/day: 0.10    Years: 14.00    Pack years: 1.40    Types: Cigarettes    Quit date: 1980    Years since quitting: 42.5   Smokeless tobacco: Never   Tobacco comments:    03/31/2018 "quit ~ 1980; someday smoker when I did smoke; never addicted"  Vaping Use   Vaping Use: Never used  Substance and Sexual Activity   Alcohol use: Never   Drug use: Yes    Types: Oxycodone, Benzodiazepines    Comment: 03/31/2018 "for chronic neck and back pain", takes Klonopin at times.    Sexual activity: Not Currently  Other Topics Concern   Not on file  Social History Narrative   Lives by herself.    Social Determinants of Health   Financial Resource Strain: Low Risk    Difficulty of Paying Living Expenses: Not hard at all  Food Insecurity: No Food Insecurity   Worried About Charity fundraiser in the Last Year: Never true   [place] in the Last Year: Never true  Transportation Needs: No Transportation Needs   Lack of Transportation (Medical): No   Lack of Transportation (Non-Medical): No  Physical Activity: Insufficiently Active   Days of Exercise per Week: 3 days   Minutes of Exercise per  Session: 20 min  Stress: No Stress Concern Present   Feeling of Stress : Only a little  Social Connections: Moderately Isolated   Frequency of Communication with Friends and Family: More than three times a week   Frequency of Social Gatherings with Friends and Family: Once a week   Attends Religious Services: 1 to 4 times per year   Active Member of [employer] or Organizations: No   Attends Club or  Organization Meetings: Never   Marital Status: Divorced     Family History: The patient's family history includes Arthritis in her mother; Diabetes in her brother, brother, and father; Heart Problems in her brother; Heart disease in her mother, sister, and sister; Hyperlipidemia in her sister and sister; Stroke in her sister; Uterine cancer in her mother.  ROS:   Please see the history of present illness.    Review of Systems  Constitutional:  Positive for malaise/fatigue. Negative for chills and fever.  HENT:  Negative for sore throat.   Eyes:  Negative for blurred vision.  Respiratory:  Positive for shortness of breath.   Cardiovascular:  Positive for leg swelling. Negative for chest pain, palpitations, orthopnea, claudication and PND.  Gastrointestinal:  Negative for nausea and vomiting.  Genitourinary:  Negative for hematuria.  Musculoskeletal:  Positive for back pain, joint pain, myalgias and neck pain.  Neurological:  Positive for weakness. Negative for dizziness and loss of consciousness.  Psychiatric/Behavioral:  Positive for depression.     EKGs/Labs/Other Studies Reviewed:    The following studies were reviewed today: TEE 02-05-2020: IMPRESSIONS   1. Left ventricular ejection fraction, by estimation, is 55 to 60%. The  left ventricle has normal function. The left ventricle has no regional  wall motion abnormalities.   2. Right ventricular systolic function is normal. The right ventricular  size is normal.   3. S/p surgical LAA clipping. No residual connection noted with the LA.   No LA thrombus. No left atrial/left atrial appendage thrombus was  detected.   4. 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 No residual MR. MVA by  direct 3D MPR assessment 2.6 cm2. The mitral valve has been  repaired/replaced. No evidence of mitral valve regurgitation. No evidence  of mitral stenosis. The mean mitral valve  gradient is 4.0 mmHg with average heart rate of 111 bpm. There is a 32 mm  prosthetic annuloplasty ring present in the mitral position. Procedure  Date: 01/17/2020.   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   6. The aortic valve is tricuspid. Aortic valve regurgitation is not  visualized. No aortic stenosis is present.   TTE 02/29/20: IMPRESSIONS   1. There has been no change since the last study on 02/05/2020. Normal  LVEF 60-65% and normal transmitral gradients, mean 4 mmHg.   2. Left ventricular ejection fraction, by estimation, is 60 to 65%. The  left ventricle has normal function. The left ventricle has no regional  wall motion abnormalities. Left ventricular diastolic function could not  be evaluated.   3. Right ventricular systolic function is normal. The right ventricular  size is normal. There is mildly elevated pulmonary artery systolic  pressure. The estimated right ventricular systolic pressure is 47.6 mmHg.   4. The mitral valve has been repaired/replaced. Trivial mitral valve  regurgitation. No evidence of mitral stenosis. The mean mitral valve  gradient is 3.0 mmHg. There is a 32 mm Sorin Memo 4D ring annuloplasty  present in the mitral position. Procedure  Date: 01/17/20.   5. Tricuspid valve regurgitation is mild to moderate.   6. The aortic valve is normal in structure. Aortic valve regurgitation is  trivial. No aortic stenosis is present.   7. The inferior vena cava is normal in size with greater than 50%  respiratory variability, suggesting right atrial pressure of 3 mmHg.   RHC/LHC 12/02/19: Prox RCA lesion is 20% stenosed. Dist LAD lesion  is 20% stenosed.   1. Mild non-obstructive CAD  Recommendation: Continue planning for mitral valve repair.     Most Recent Value  Fick Cardiac Output 5.84 L/min  Fick Cardiac Output Index 3.58 (L/min)/BSA  RA A Wave 2 mmHg  RA V Wave 1 mmHg  RA Mean 0 mmHg  RV Systolic Pressure 29 mmHg  RV Diastolic Pressure -2 mmHg  RV EDP 3 mmHg  PA Systolic Pressure 32 mmHg  PA Diastolic Pressure 10 mmHg  PA Mean 21 mmHg  PW A Wave 22 mmHg  PW V Wave 20 mmHg  PW Mean 13 mmHg  AO Systolic Pressure 253 mmHg  AO Diastolic Pressure 61 mmHg  AO Mean 88 mmHg  LV Systolic Pressure 664 mmHg  LV Diastolic Pressure 1 mmHg  LV EDP 9 mmHg  AOp Systolic Pressure 403 mmHg  AOp Diastolic Pressure 66 mmHg  AOp Mean Pressure 93 mmHg  LVp Systolic Pressure 474 mmHg  LVp Diastolic Pressure 0 mmHg  LVp EDP Pressure 7 mmHg        EKG:  No new tracing today  Recent Labs: 09/21/2020: Magnesium 2.0 11/14/2020: NT-Pro BNP 790 12/10/2020: ALT 17; BUN 22; Creatinine, Ser 0.78; Hemoglobin 12.5; Platelets 191.0; Potassium 4.3; Sodium 142; TSH 0.63  Recent Lipid Panel    Component Value Date/Time   CHOL 175 12/10/2020 1338   TRIG 79.0 12/10/2020 1338   HDL 80.40 12/10/2020 1338   CHOLHDL 2 12/10/2020 1338   VLDL 15.8 12/10/2020 1338   LDLCALC 79 12/10/2020 1338   LDLDIRECT 136.5 06/08/2013 1049     Risk Assessment/Calculations:    CHA2DS2-VASc Score = 3  This indicates a 3.2% annual risk of stroke. The patient's score is based upon: CHF History: No HTN History: No Diabetes History: No Stroke History: No Vascular Disease History: No Age Score: 2 Gender Score: 1        Physical Exam:    VS:  BP (!) 142/88   Pulse 80   Ht 5\' 6"  (1.676 m)   Wt 116 lb 9.6 oz (52.9 kg)   SpO2 97%   BMI 18.82 kg/m     Wt Readings from Last 3 Encounters:  01/23/21 116 lb 9.6 oz (52.9 kg)  01/15/21 119 lb 3.2 oz (54.1 kg)  12/18/20 118 lb 9.6 oz (53.8 kg)     GEN: Elderly frail female, comfortable,  NAD HEENT: Normal NECK: No JVD; No carotid bruits CARDIAC: RRR, 1/6 systolic murmur. No rubs, gallops RESPIRATORY:  Clear to auscultation without rales, wheezing or rhonchi  ABDOMEN: Soft, non-tender, non-distended MUSCULOSKELETAL:  Chronic venous stasis changes, no significant edema SKIN: Warm and dry NEUROLOGIC:  Alert and oriented x 3 PSYCHIATRIC:  Depressed  ASSESSMENT:    1. S/P mitral valve repair   2. SOB (shortness of breath)   3. Shortness of breath   4. Physical deconditioning   5. Severe mitral regurgitation   6. S/P Maze operation for atrial fibrillation   7. Atrial fibrillation, unspecified type ([place])   8. Dyspnea, unspecified type   9. Chronic venous stasis   10. Chronic diastolic heart failure (HCC)    PLAN:    In order of problems listed above:  #Severe MR s/p Mitral Valve Repair: Stable on TTE 02/2020 with LVEF 60-65%, normal RV size and function, RVSP 51mmHg, mean gradient across mitral 80mmHg, trivial MR. Continues to have some shortness of breath with exertion, but overall improved from prior. Likely related to deconditioning with multiple surgeries for her back and recent MVR. No significant CAD on cath. Will check interval  limited TTE given persistent SOB. -S/p MV repair 01/2020; trivial MR with mean gradient 64mmHg in 02/29/20 -Continue lasix 20mg  PO daily -Will need IE prophylaxis for dental procedures   #Chronic Diastolic HF: #DOE: Stable. TTE 10/22/20 with LVEF 50-55%, mean MV gradient 68mmHg, trivial MR. Suspect secondary to deconditioning and some diastolic dysfunction.  -Continue lasix 20mg  daily -Low Na diet   #Right Chest Wall Pain: Improved. Patient with burning pain in the right side of the chest that is worse with palpation and deep inspiration consistent with neuropathic pain. CXR with improved right sided pleural effusion; no acute pathology. D-dimer negative.  -Continue topical lidocaine as needed -Follow-up with primary vs pain specialist  about starting possible medication for neuropathy; has numerous medication intolerances   #Afib with RVR CHADs-vasc 3. Managed by [name]. -Off amiodarone  -Continue metop tartrate 50mg  BID -Changed from warfarin to xarelto   #Nonobstructive CAD: Pre-operative cath 11/2019 with mild disease (20%) in RCA and LAD. -Patient declined statin therapy as she has significant medication reactions and wants to avoiding trying something new   #Scoliosis: #LE weakness #Neuropathy S/p multiple correctional back surgeries. Walks with cane or walker. Feels unsteady on her feet with significant bilateral weakness as well as neuropathy -Follow-up with primary provider as scheduled -Pain management following   #Depression: -Continue counseling -Follow-up with PCP as scheduled  #Chronic venous stasis: Planned for venous intervention. No contraindication for the procedure from the CV standpoint. -Planned for venous intervention for chronic venous stasis changes -Cleared from CV standpoint for the procedure with no further testing needed       Medication Adjustments/Labs and Tests Ordered: Current medicines are reviewed at length with the patient today.  Concerns regarding medicines are outlined above.  No orders of the defined types were placed in this encounter.  Meds ordered this encounter  Medications   rivaroxaban (XARELTO) 20 MG TABS tablet    Sig: Take 1 tablet (20 mg total) by mouth daily with supper.    Dispense:  30 tablet    Refill:  6   metoprolol tartrate (LOPRESSOR) 50 MG tablet    Sig: Take 1 tablet (50 mg total) by mouth 2 (two) times daily.    Dispense:  180 tablet    Refill:  3   furosemide (LASIX) 20 MG tablet    Sig: Take 1 tablet (20 mg total) by mouth daily.    Dispense:  90 tablet    Refill:  3    Patient Instructions  Medication Instructions:   Your physician recommends that you continue on your current medications as directed. Please refer to the Current  Medication list given to you today.  *If you need a refill on your cardiac medications before your next appointment, please call your pharmacy*  Follow-Up: At [facility], you and your health needs are our priority.  As part of our continuing mission to provide you with exceptional heart care, we have created designated Provider Care Teams.  These Care Teams include your primary Cardiologist (physician) and Advanced Practice Providers (APPs -  Physician Assistants and Nurse Practitioners) who all work together to provide you with the care you need, when you need it.  We recommend signing up for the patient portal called "MyChart".  Sign up information is provided on this After Visit Summary.  MyChart is used to connect with patients for Virtual Visits (Telemedicine).  Patients are able to view lab/test results, encounter notes, upcoming appointments, etc.  Non-urgent messages can be sent to  your provider as well.   To learn more about what you can do with MyChart, go to [email or web address].    Your next appointment:   6 month(s)  The format for your next appointment:   In Person  Provider:   You will see one of the following Advanced Practice Providers on your designated Care Team:   Richardson Dopp, PA-C Robbie [place], [place]    Signed, Freada Bergeron, MD  01/23/2021 5:49 PM    [place]

## 2021-01-22 ENCOUNTER — Ambulatory Visit: Payer: Medicare Other | Admitting: Cardiology

## 2021-01-22 DIAGNOSIS — Z888 Allergy status to other drugs, medicaments and biological substances status: Secondary | ICD-10-CM | POA: Diagnosis not present

## 2021-01-22 DIAGNOSIS — Z882 Allergy status to sulfonamides status: Secondary | ICD-10-CM | POA: Diagnosis not present

## 2021-01-22 DIAGNOSIS — Z881 Allergy status to other antibiotic agents status: Secondary | ICD-10-CM | POA: Diagnosis not present

## 2021-01-22 DIAGNOSIS — G894 Chronic pain syndrome: Secondary | ICD-10-CM | POA: Diagnosis not present

## 2021-01-22 DIAGNOSIS — Z886 Allergy status to analgesic agent status: Secondary | ICD-10-CM | POA: Diagnosis not present

## 2021-01-22 DIAGNOSIS — Z885 Allergy status to narcotic agent status: Secondary | ICD-10-CM | POA: Diagnosis not present

## 2021-01-22 DIAGNOSIS — Z9181 History of falling: Secondary | ICD-10-CM | POA: Diagnosis not present

## 2021-01-22 DIAGNOSIS — Z981 Arthrodesis status: Secondary | ICD-10-CM | POA: Diagnosis not present

## 2021-01-22 NOTE — Telephone Encounter (Signed)
**Note De-Identified [name] Obfuscation** Letter received [name] fax from [facility] [employer] [facility] stating that they have approved the pt for asst with her Xarelto until 07/06/2021. Record ID: [id number]  The letter states that they have notified the pt of this approval as well.

## 2021-01-23 ENCOUNTER — Other Ambulatory Visit: Payer: Self-pay

## 2021-01-23 ENCOUNTER — Ambulatory Visit (INDEPENDENT_AMBULATORY_CARE_PROVIDER_SITE_OTHER): Payer: Medicare Other | Admitting: Cardiology

## 2021-01-23 ENCOUNTER — Encounter: Payer: Self-pay | Admitting: Cardiology

## 2021-01-23 VITALS — BP 142/88 | HR 80 | Ht 66.0 in | Wt 116.6 lb

## 2021-01-23 DIAGNOSIS — I878 Other specified disorders of veins: Secondary | ICD-10-CM | POA: Diagnosis not present

## 2021-01-23 DIAGNOSIS — R5381 Other malaise: Secondary | ICD-10-CM

## 2021-01-23 DIAGNOSIS — Z8679 Personal history of other diseases of the circulatory system: Secondary | ICD-10-CM

## 2021-01-23 DIAGNOSIS — R0600 Dyspnea, unspecified: Secondary | ICD-10-CM

## 2021-01-23 DIAGNOSIS — I4891 Unspecified atrial fibrillation: Secondary | ICD-10-CM | POA: Diagnosis not present

## 2021-01-23 DIAGNOSIS — I340 Nonrheumatic mitral (valve) insufficiency: Secondary | ICD-10-CM

## 2021-01-23 DIAGNOSIS — I5032 Chronic diastolic (congestive) heart failure: Secondary | ICD-10-CM

## 2021-01-23 DIAGNOSIS — Z9889 Other specified postprocedural states: Secondary | ICD-10-CM

## 2021-01-23 DIAGNOSIS — R0602 Shortness of breath: Secondary | ICD-10-CM | POA: Diagnosis not present

## 2021-01-23 DIAGNOSIS — Z954 Presence of other heart-valve replacement: Secondary | ICD-10-CM

## 2021-01-23 MED ORDER — METOPROLOL TARTRATE 50 MG PO TABS: 50.0000 mg | ORAL_TABLET | Freq: Two times a day (BID) | ORAL | 3 refills | Status: DC

## 2021-01-23 MED ORDER — FUROSEMIDE 20 MG PO TABS: 20.0000 mg | ORAL_TABLET | Freq: Every day | ORAL | 3 refills | Status: DC

## 2021-01-23 MED ORDER — RIVAROXABAN 20 MG PO TABS
20.0000 mg | ORAL_TABLET | Freq: Every day | ORAL | 6 refills | Status: DC
Start: 2021-01-23 — End: 2021-08-26

## 2021-01-23 NOTE — Patient Instructions (Signed)
Medication Instructions:   Your physician recommends that you continue on your current medications as directed. Please refer to the Current Medication list given to you today.  *If you need a refill on your cardiac medications before your next appointment, please call your pharmacy*  Follow-Up: At [facility], you and your health needs are our priority.  As part of our continuing mission to provide you with exceptional heart care, we have created designated Provider Care Teams.  These Care Teams include your primary Cardiologist (physician) and Advanced Practice Providers (APPs -  Physician Assistants and Nurse Practitioners) who all work together to provide you with the care you need, when you need it.  We recommend signing up for the patient portal called "MyChart".  Sign up information is provided on this After Visit Summary.  MyChart is used to connect with patients for Virtual Visits (Telemedicine).  Patients are able to view lab/test results, encounter notes, upcoming appointments, etc.  Non-urgent messages can be sent to your provider as well.   To learn more about what you can do with MyChart, go to [email or web address].    Your next appointment:   6 month(s)  The format for your next appointment:   In Person  Provider:   You will see one of the following Advanced Practice Providers on your designated Care Team:   Richardson Dopp, PA-C [place], [place]

## 2021-01-28 ENCOUNTER — Other Ambulatory Visit: Payer: Self-pay | Admitting: Family Medicine

## 2021-01-29 DIAGNOSIS — Z20822 Contact with and (suspected) exposure to covid-19: Secondary | ICD-10-CM | POA: Diagnosis not present

## 2021-01-31 ENCOUNTER — Other Ambulatory Visit: Payer: Self-pay

## 2021-01-31 ENCOUNTER — Other Ambulatory Visit: Payer: Self-pay | Admitting: Family Medicine

## 2021-01-31 DIAGNOSIS — R110 Nausea: Secondary | ICD-10-CM

## 2021-01-31 MED ORDER — ONDANSETRON HCL 4 MG PO TABS: 4.0000 mg | ORAL_TABLET | Freq: Three times a day (TID) | ORAL | 0 refills | Status: DC | PRN

## 2021-02-04 DIAGNOSIS — Z20822 Contact with and (suspected) exposure to covid-19: Secondary | ICD-10-CM | POA: Diagnosis not present

## 2021-02-08 DIAGNOSIS — I83811 Varicose veins of right lower extremities with pain: Secondary | ICD-10-CM | POA: Diagnosis not present

## 2021-02-14 DIAGNOSIS — H43813 Vitreous degeneration, bilateral: Secondary | ICD-10-CM | POA: Diagnosis not present

## 2021-02-14 DIAGNOSIS — H35372 Puckering of macula, left eye: Secondary | ICD-10-CM | POA: Diagnosis not present

## 2021-02-14 DIAGNOSIS — H25813 Combined forms of age-related cataract, bilateral: Secondary | ICD-10-CM | POA: Diagnosis not present

## 2021-02-15 IMAGING — CT CT ANGIO CHEST
3 of 8 series · 11 of 36 positions shown · non-contrast
Comparison: CT abdomen pelvis, 02/20/2011

CLINICAL DATA: History of aortic dissection, clinical status change

EXAM:
CT ANGIOGRAPHY CHEST, ABDOMEN AND PELVIS
TECHNIQUE: Non-contrast CT of the chest was initially obtained.

[Series 9: cta thorax 2.00 bv36 s3 axial arterial · axial · arterial · 0.46mm/px · z∈[+1201,+1668]mm · 7 of 314 slices shown]
[im 40/314  lung]
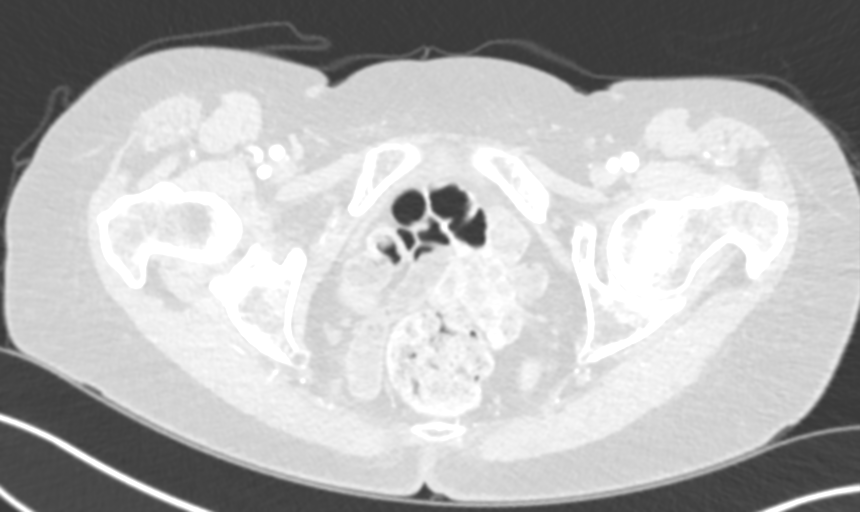
[im 79/314  mediastinal]
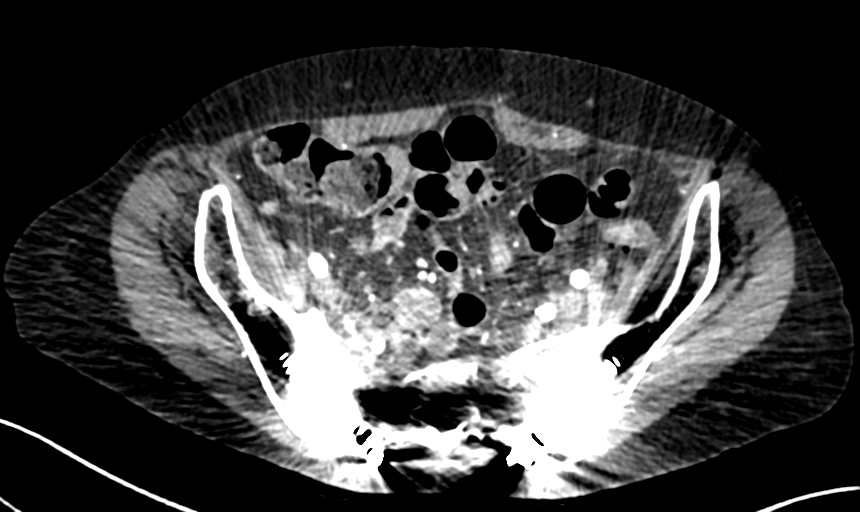
[im 118/314  lung]
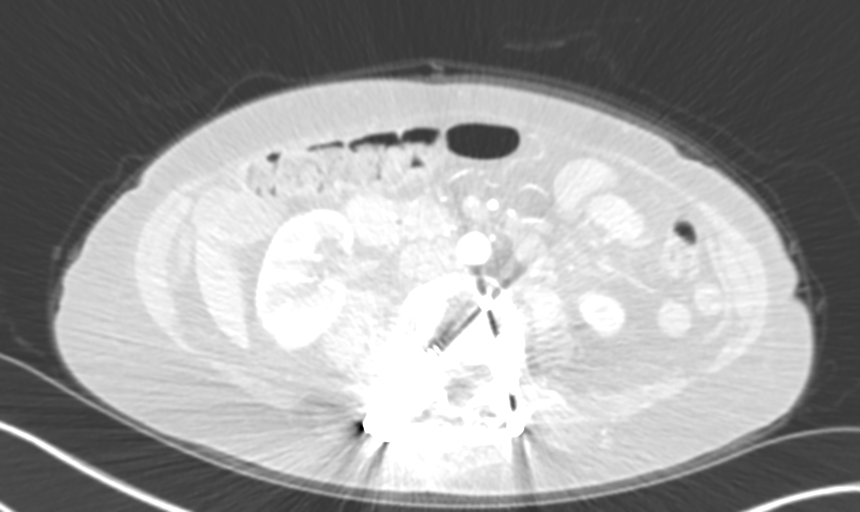
[im 157/314  mediastinal]
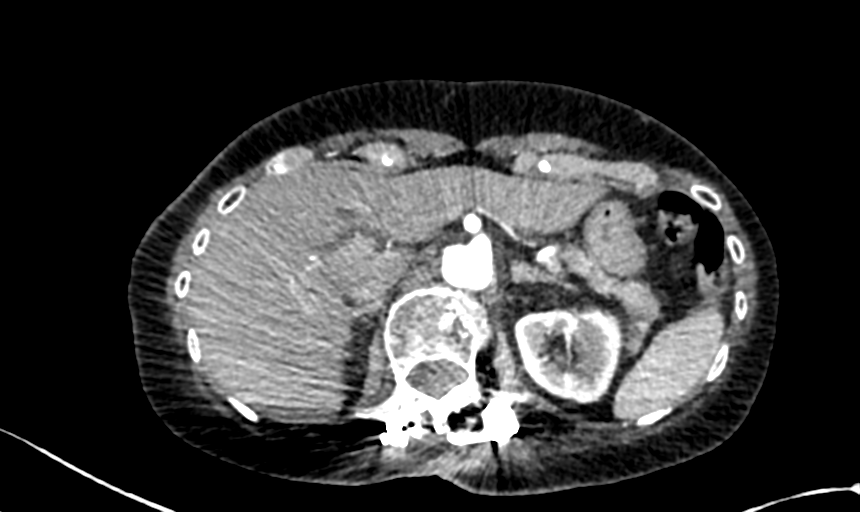
[im 196/314  lung]
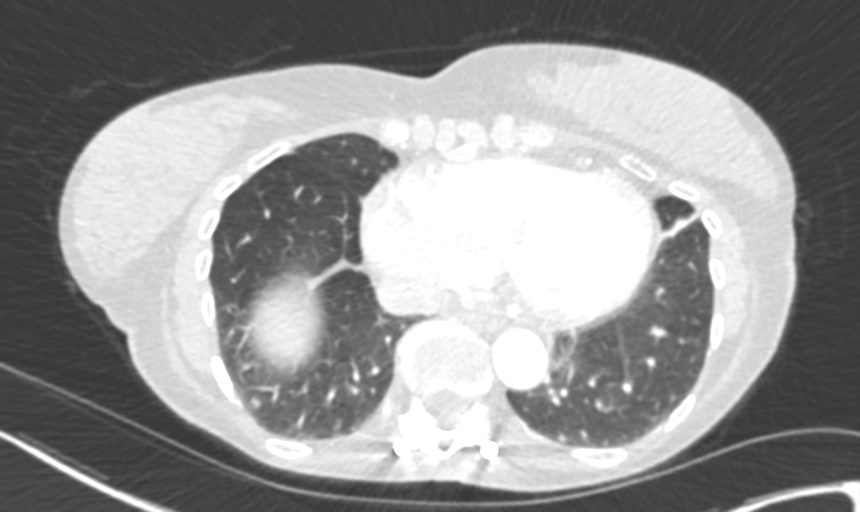
[im 235/314  mediastinal]
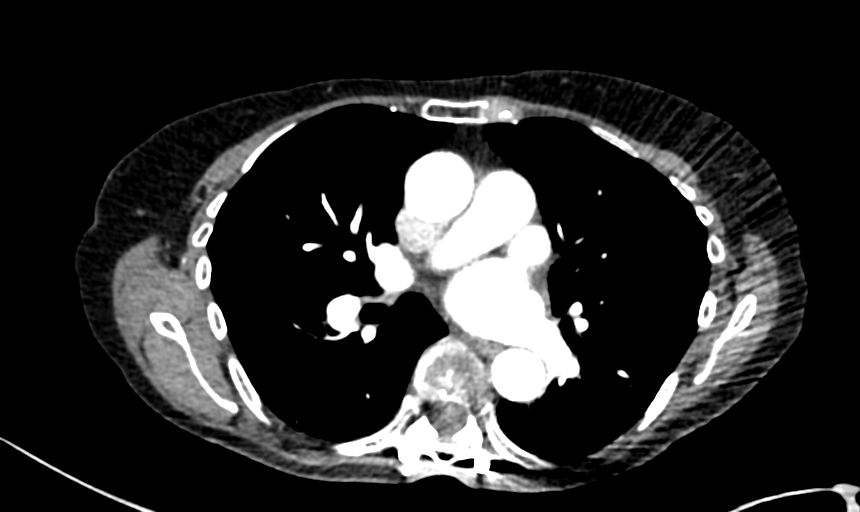
[im 274/314  lung]
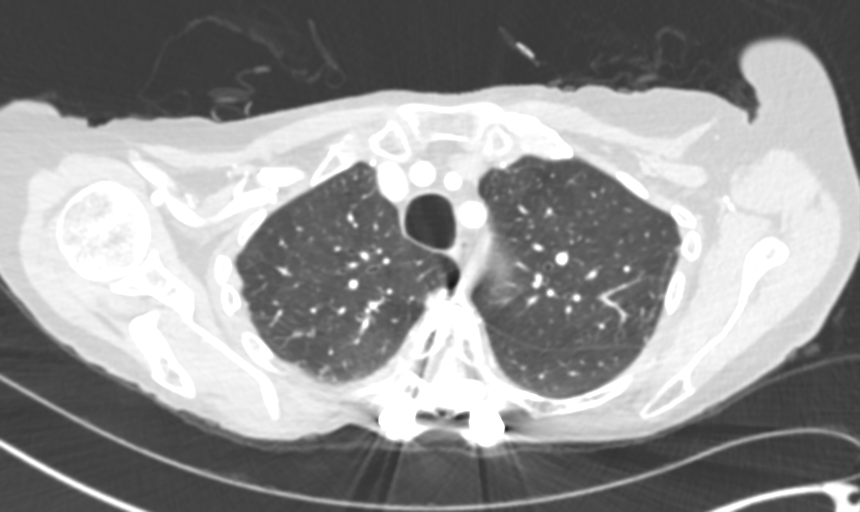

[Series 11: cta thorax 2.00 br60 s3 axial arterial · axial · arterial · 0.46mm/px · z∈[+1201,+1357]mm · 3 of 314 slices shown]
[im 40/314  lung]
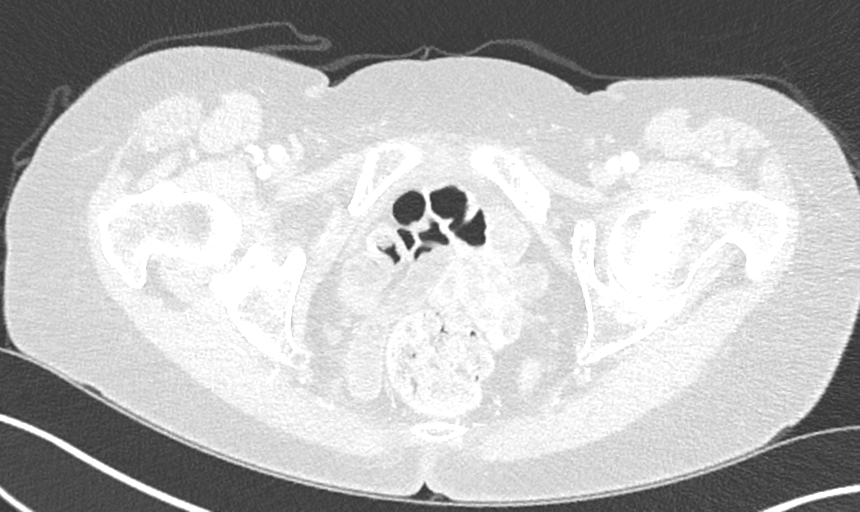
[im 79/314  lung]
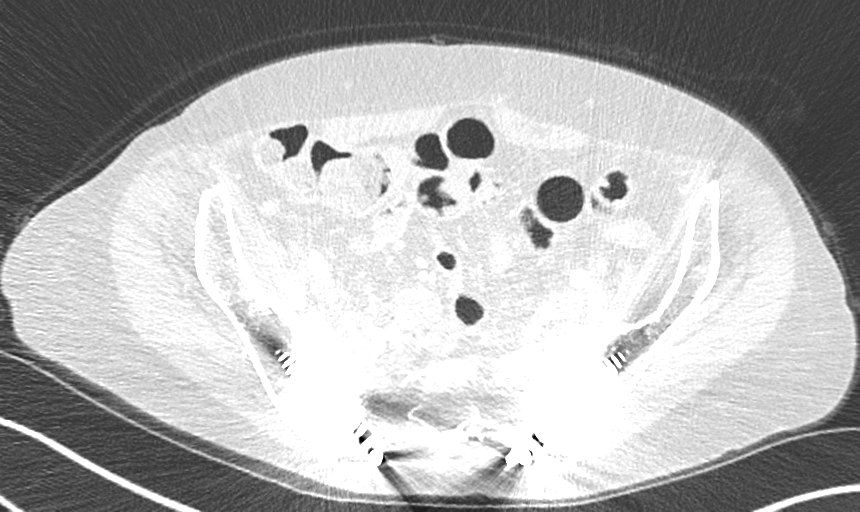
[im 118/314  lung]
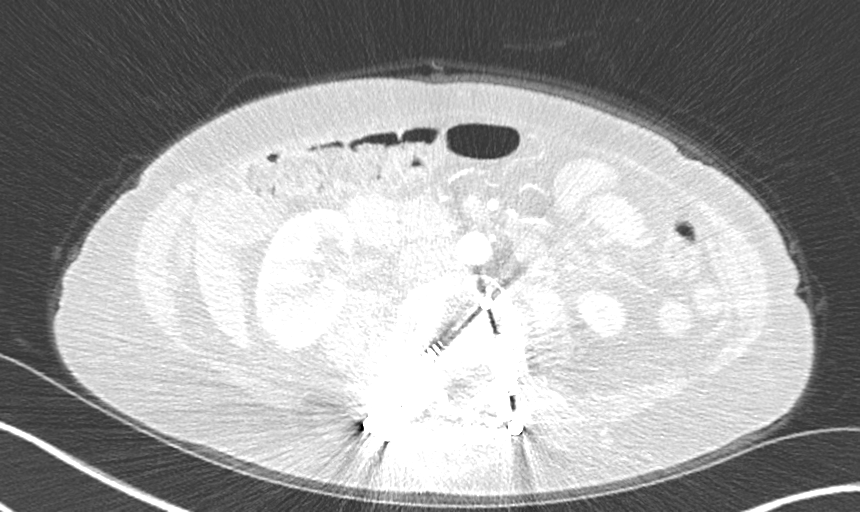

[Series 14: cta thorax 2.00 bv36 s3 cor st · coronal · 0.77mm/px · 1 of 116 slices shown]
[im 58/116  mediastinal]
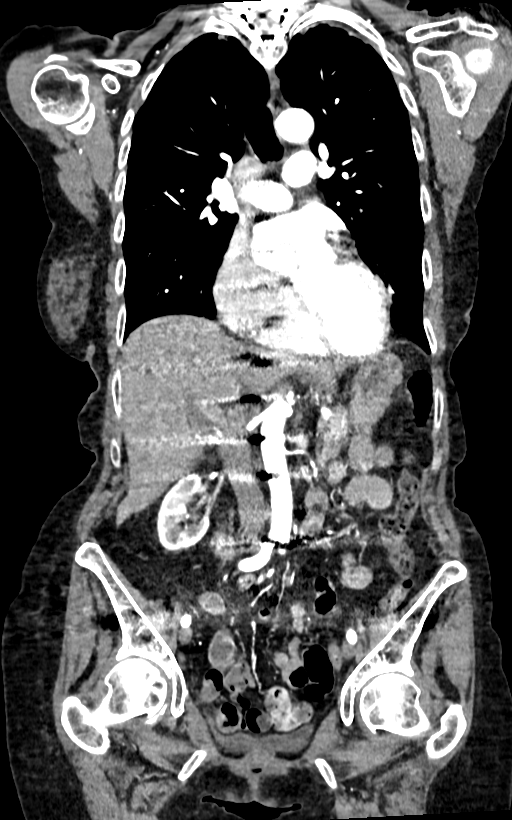

[11 of 36 positions shown; findings below may reference images not displayed]

Multidetector CT imaging through the chest, abdomen and pelvis was
performed using the standard protocol during bolus administration of
intravenous contrast. Multiplanar reconstructed images and MIPs were
obtained and reviewed to evaluate the vascular anatomy.

CONTRAST:  75mL MUHQ61-3X2 IOPAMIDOL (MUHQ61-3X2) INJECTION 76%
FINDINGS: CTA CHEST FINDINGS

Cardiovascular: Preferential opacification of the thoracic aorta.
Normal contour and caliber of the thoracic aorta. No evidence of
aneurysm or dissection. Minimal atherosclerosis of the arch. Mild
cardiomegaly with enlargement of the left atrium. No pericardial
effusion.

Mediastinum/Nodes: No enlarged mediastinal, hilar, or axillary lymph
nodes. Thyroid gland, trachea, and esophagus demonstrate no
significant findings.

Lungs/Pleura: Clustered centrilobular nodular opacities and small
consolidations of the right middle lobe and lingula. No pleural
effusion or pneumothorax.

Musculoskeletal: No chest wall abnormality. Complete posterior
thoracolumbar fusion.

Review of the MIP images confirms the above findings.

CTA ABDOMEN AND PELVIS FINDINGS

VASCULAR

Normal contour and caliber of the abdominal aorta. No evidence of
aortic dissection, aneurysm, or other acute aortic pathology.
Incidental note of a small inferior pole accessory left renal
artery, with otherwise standard branching pattern. Minimal,
scattered aortic atherosclerosis.

Review of the MIP images confirms the above findings.

NON-VASCULAR

Hepatobiliary: No solid liver abnormality is seen. No gallstones,
gallbladder wall thickening, or biliary dilatation.

Pancreas: Unremarkable. No pancreatic ductal dilatation or
surrounding inflammatory changes.

Spleen: Normal in size without significant abnormality.

Adrenals/Urinary Tract: Adrenal glands are unremarkable. Kidneys are
normal, without renal calculi, solid lesion, or hydronephrosis.
Bladder is unremarkable.

Stomach/Bowel: Stomach is within normal limits. Appendix appears
normal. No evidence of bowel wall thickening, distention, or
inflammatory changes.

Lymphatic: No enlarged abdominal or pelvic lymph nodes.

Reproductive: Status post hysterectomy.

Other: No abdominal wall hernia or abnormality. No abdominopelvic
ascites.

Musculoskeletal: No acute osseous findings.

Review of the MIP images confirms the above findings.
IMPRESSION: 1. Normal contour and caliber of the thoracic and abdominal aorta.
No evidence of aortic dissection, aneurysm, or other acute aortic
pathology. By report, patient has a history of aortic dissection,
which is not evident on this examination. Comparison to prior
imaging demonstrating this dissection may be helpful to assess for
interval change.
2. Minimal, scattered aortic atherosclerosis. Aortic Atherosclerosis
([id number]-3B1.1).
3. Clustered centrilobular nodular opacities and small
consolidations of the right middle lobe and lingula, consistent with
atypical infection, particularly atypical mycobacterium.
4. Cardiomegaly with enlargement of the left atrium.
5. Complete thoracolumbar fusion.

## 2021-02-15 IMAGING — CT CT CTA ABD/PEL W/CM AND/OR W/O CM
3 of 8 series · 11 of 36 positions shown · non-contrast
Comparison: CT abdomen pelvis, 02/20/2011

CLINICAL DATA: History of aortic dissection, clinical status change

EXAM:
CT ANGIOGRAPHY CHEST, ABDOMEN AND PELVIS
TECHNIQUE: Non-contrast CT of the chest was initially obtained.

[Series 9: cta thorax 2.00 bv36 s3 axial arterial · axial · arterial · 0.46mm/px · z∈[+1201,+1668]mm · 7 of 314 slices shown]
[im 40/314  lung]
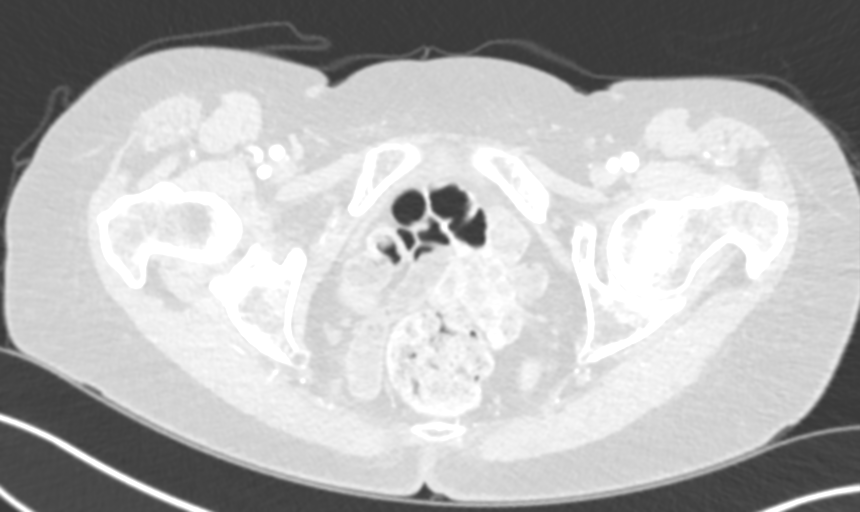
[im 79/314  mediastinal]
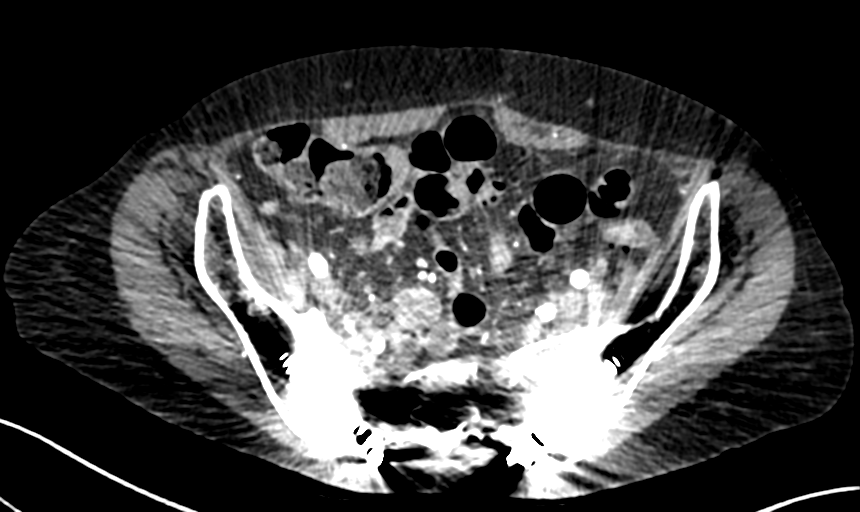
[im 118/314  lung]
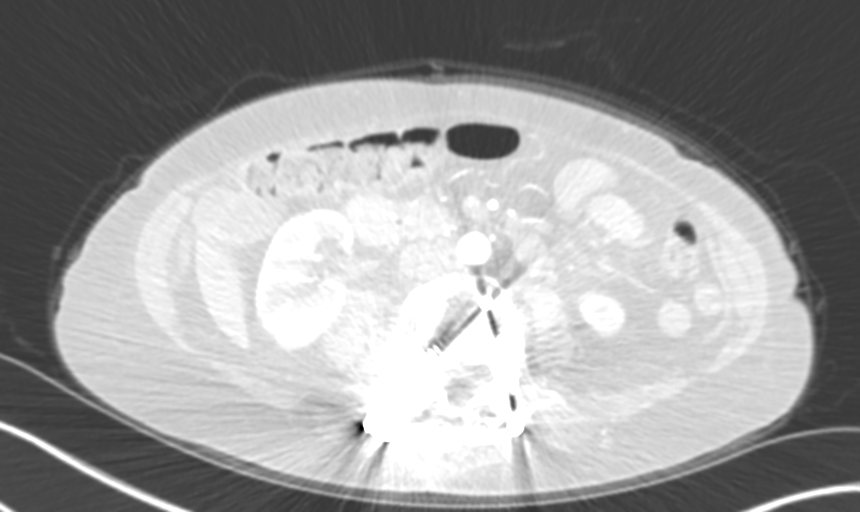
[im 157/314  mediastinal]
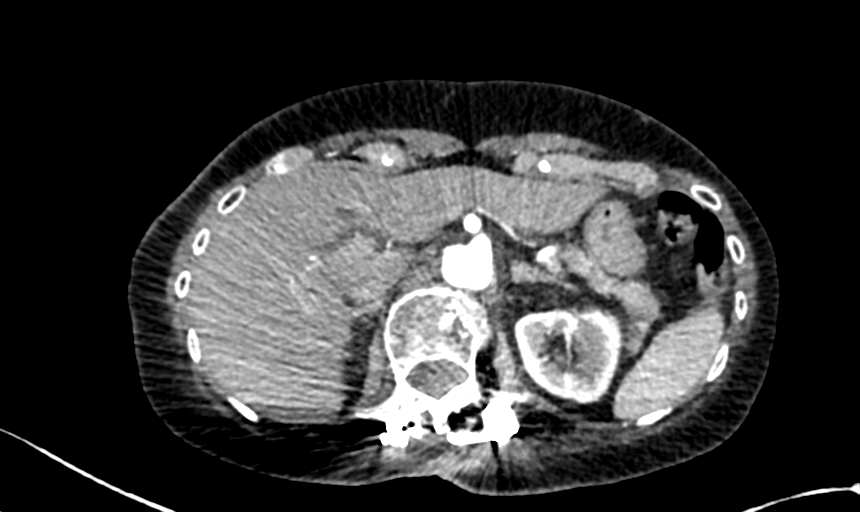
[im 196/314  lung]
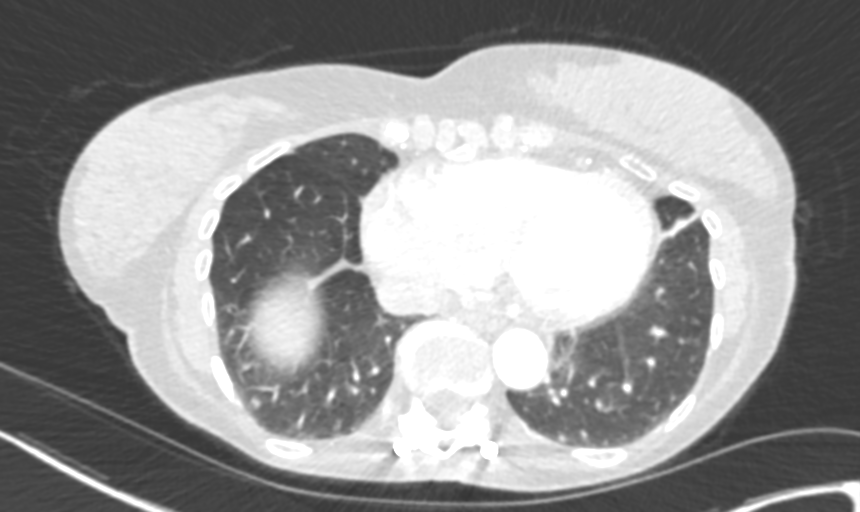
[im 235/314  mediastinal]
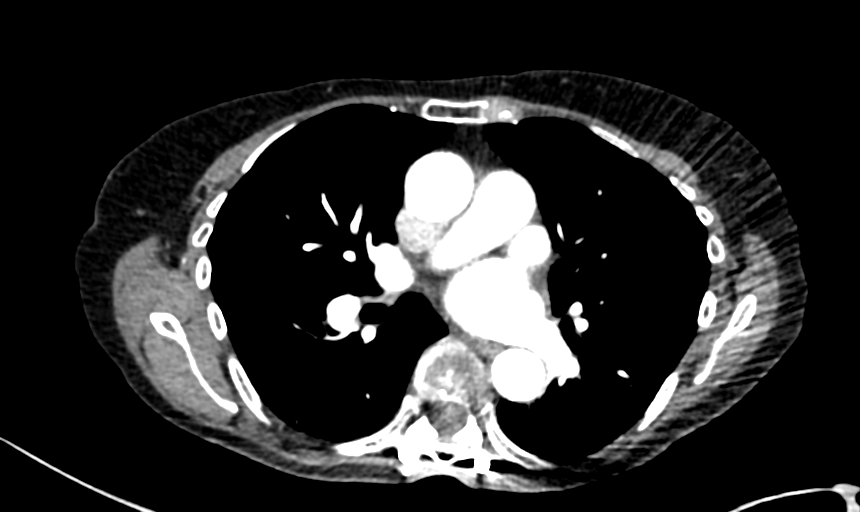
[im 274/314  lung]
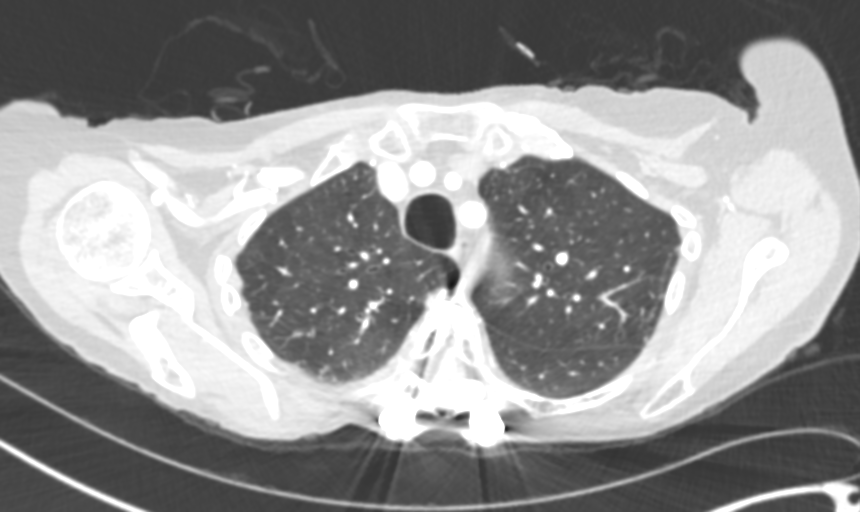

[Series 11: cta thorax 2.00 br60 s3 axial arterial · axial · arterial · 0.46mm/px · z∈[+1201,+1357]mm · 3 of 314 slices shown]
[im 40/314  lung]
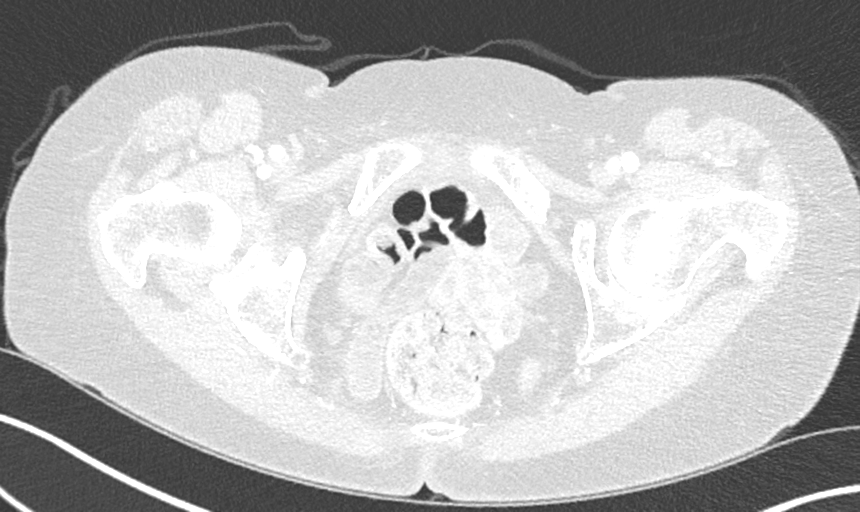
[im 79/314  lung]
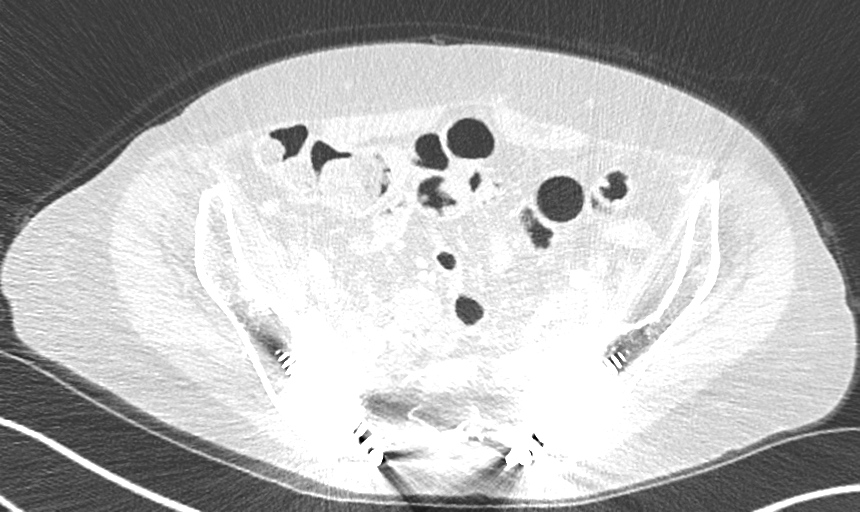
[im 118/314  lung]
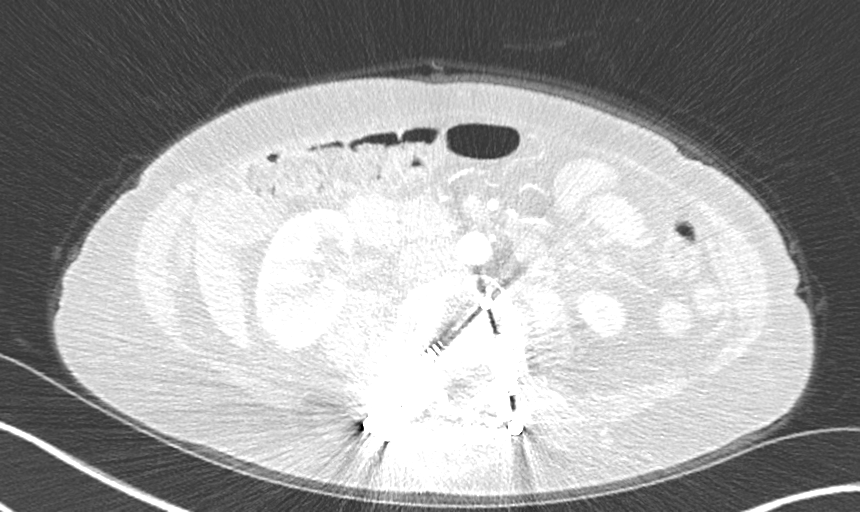

[Series 14: cta thorax 2.00 bv36 s3 cor st · coronal · 0.77mm/px · 1 of 116 slices shown]
[im 58/116  mediastinal]
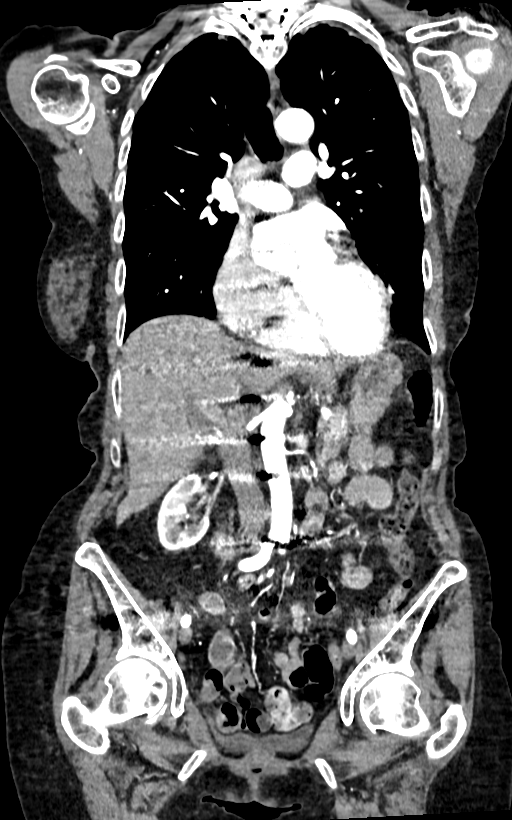

[11 of 36 positions shown; findings below may reference images not displayed]

Multidetector CT imaging through the chest, abdomen and pelvis was
performed using the standard protocol during bolus administration of
intravenous contrast. Multiplanar reconstructed images and MIPs were
obtained and reviewed to evaluate the vascular anatomy.

CONTRAST:  75mL MUHQ61-3X2 IOPAMIDOL (MUHQ61-3X2) INJECTION 76%
FINDINGS: CTA CHEST FINDINGS

Cardiovascular: Preferential opacification of the thoracic aorta.
Normal contour and caliber of the thoracic aorta. No evidence of
aneurysm or dissection. Minimal atherosclerosis of the arch. Mild
cardiomegaly with enlargement of the left atrium. No pericardial
effusion.

Mediastinum/Nodes: No enlarged mediastinal, hilar, or axillary lymph
nodes. Thyroid gland, trachea, and esophagus demonstrate no
significant findings.

Lungs/Pleura: Clustered centrilobular nodular opacities and small
consolidations of the right middle lobe and lingula. No pleural
effusion or pneumothorax.

Musculoskeletal: No chest wall abnormality. Complete posterior
thoracolumbar fusion.

Review of the MIP images confirms the above findings.

CTA ABDOMEN AND PELVIS FINDINGS

VASCULAR

Normal contour and caliber of the abdominal aorta. No evidence of
aortic dissection, aneurysm, or other acute aortic pathology.
Incidental note of a small inferior pole accessory left renal
artery, with otherwise standard branching pattern. Minimal,
scattered aortic atherosclerosis.

Review of the MIP images confirms the above findings.

NON-VASCULAR

Hepatobiliary: No solid liver abnormality is seen. No gallstones,
gallbladder wall thickening, or biliary dilatation.

Pancreas: Unremarkable. No pancreatic ductal dilatation or
surrounding inflammatory changes.

Spleen: Normal in size without significant abnormality.

Adrenals/Urinary Tract: Adrenal glands are unremarkable. Kidneys are
normal, without renal calculi, solid lesion, or hydronephrosis.
Bladder is unremarkable.

Stomach/Bowel: Stomach is within normal limits. Appendix appears
normal. No evidence of bowel wall thickening, distention, or
inflammatory changes.

Lymphatic: No enlarged abdominal or pelvic lymph nodes.

Reproductive: Status post hysterectomy.

Other: No abdominal wall hernia or abnormality. No abdominopelvic
ascites.

Musculoskeletal: No acute osseous findings.

Review of the MIP images confirms the above findings.
IMPRESSION: 1. Normal contour and caliber of the thoracic and abdominal aorta.
No evidence of aortic dissection, aneurysm, or other acute aortic
pathology. By report, patient has a history of aortic dissection,
which is not evident on this examination. Comparison to prior
imaging demonstrating this dissection may be helpful to assess for
interval change.
2. Minimal, scattered aortic atherosclerosis. Aortic Atherosclerosis
(ZPZJD-3B1.1).
3. Clustered centrilobular nodular opacities and small
consolidations of the right middle lobe and lingula, consistent with
atypical infection, particularly atypical mycobacterium.
4. Cardiomegaly with enlargement of the left atrium.
5. Complete thoracolumbar fusion.

## 2021-02-18 DIAGNOSIS — M961 Postlaminectomy syndrome, not elsewhere classified: Secondary | ICD-10-CM | POA: Diagnosis not present

## 2021-02-18 DIAGNOSIS — Z79891 Long term (current) use of opiate analgesic: Secondary | ICD-10-CM | POA: Diagnosis not present

## 2021-02-18 DIAGNOSIS — M4693 Unspecified inflammatory spondylopathy, cervicothoracic region: Secondary | ICD-10-CM | POA: Diagnosis not present

## 2021-02-18 DIAGNOSIS — M5412 Radiculopathy, cervical region: Secondary | ICD-10-CM | POA: Diagnosis not present

## 2021-02-18 DIAGNOSIS — G894 Chronic pain syndrome: Secondary | ICD-10-CM | POA: Diagnosis not present

## 2021-02-26 ENCOUNTER — Encounter: Payer: Self-pay | Admitting: Family Medicine

## 2021-02-27 ENCOUNTER — Telehealth: Payer: Self-pay | Admitting: Cardiology

## 2021-02-27 ENCOUNTER — Encounter: Payer: Self-pay | Admitting: Family Medicine

## 2021-02-27 NOTE — Telephone Encounter (Signed)
New Message:     Patient says she have been suffering with an upset stomach for awhile. She wants to know what can she take over the counter that is safe for her to take with her condition?

## 2021-02-27 NOTE — Telephone Encounter (Signed)
Follow Up:    Patient says it is an upset stomach and diarrhea.

## 2021-02-27 NOTE — Telephone Encounter (Signed)
Pt made aware that per our Pharmacist, she can use Pepto Bismol as needed for a few days. Pt verbalized understanding and agrees with this plan. Pt was more than gracious for all the assistance provided.

## 2021-02-27 NOTE — Telephone Encounter (Signed)
She can use Pepto Bismol as needed for a few days.

## 2021-02-27 NOTE — Telephone Encounter (Signed)
Pt is calling to ask Pharmacist here in the office what she can take OTC for upset stomach and diarrhea, that doesn't interfere with her cardiac meds and history.  Will forward this message to our Pharmacist for further advisement and follow-up with the pt accordingly thereafter.

## 2021-03-04 ENCOUNTER — Telehealth: Payer: Self-pay | Admitting: Family Medicine

## 2021-03-04 NOTE — Telephone Encounter (Signed)
Patient would like to be referred to [facility] - she wants to see him for her neuropathy.

## 2021-03-05 ENCOUNTER — Other Ambulatory Visit: Payer: Self-pay

## 2021-03-05 DIAGNOSIS — G629 Polyneuropathy, unspecified: Secondary | ICD-10-CM

## 2021-03-05 NOTE — Telephone Encounter (Signed)
Referral placed.

## 2021-03-15 DIAGNOSIS — Z20822 Contact with and (suspected) exposure to covid-19: Secondary | ICD-10-CM | POA: Diagnosis not present

## 2021-03-20 DIAGNOSIS — Z20822 Contact with and (suspected) exposure to covid-19: Secondary | ICD-10-CM | POA: Diagnosis not present

## 2021-03-21 ENCOUNTER — Telehealth: Payer: Self-pay | Admitting: Cardiology

## 2021-03-21 DIAGNOSIS — I83819 Varicose veins of unspecified lower extremities with pain: Secondary | ICD-10-CM | POA: Diagnosis not present

## 2021-03-21 DIAGNOSIS — L97919 Non-pressure chronic ulcer of unspecified part of right lower leg with unspecified severity: Secondary | ICD-10-CM | POA: Diagnosis not present

## 2021-03-21 DIAGNOSIS — I83019 Varicose veins of right lower extremity with ulcer of unspecified site: Secondary | ICD-10-CM | POA: Diagnosis not present

## 2021-03-21 NOTE — Telephone Encounter (Signed)
Encounter not needed

## 2021-03-25 ENCOUNTER — Ambulatory Visit (INDEPENDENT_AMBULATORY_CARE_PROVIDER_SITE_OTHER): Payer: Medicare Other | Admitting: Student

## 2021-03-25 ENCOUNTER — Ambulatory Visit: Payer: Medicare Other | Admitting: Physician Assistant

## 2021-03-25 ENCOUNTER — Other Ambulatory Visit: Payer: Self-pay

## 2021-03-25 ENCOUNTER — Encounter: Payer: Self-pay | Admitting: Student

## 2021-03-25 VITALS — BP 124/68 | HR 65 | Ht 66.0 in | Wt 113.8 lb

## 2021-03-25 DIAGNOSIS — I5032 Chronic diastolic (congestive) heart failure: Secondary | ICD-10-CM | POA: Diagnosis not present

## 2021-03-25 DIAGNOSIS — R0600 Dyspnea, unspecified: Secondary | ICD-10-CM

## 2021-03-25 DIAGNOSIS — I4891 Unspecified atrial fibrillation: Secondary | ICD-10-CM

## 2021-03-25 DIAGNOSIS — R0602 Shortness of breath: Secondary | ICD-10-CM | POA: Diagnosis not present

## 2021-03-25 NOTE — Progress Notes (Signed)
PCP:  [name], MD Primary Cardiologist: Freada Bergeron, MD Electrophysiologist: Will Meredith Leeds, MD   [name] is a 75 y.o. female seen today for Will Meredith Leeds, MD for routine electrophysiology followup.  Since last being seen in our clinic the patient reports doing OK overall. She is very anxious about her various conditions and has multiple questions. She has back pain between her shoulder blades, primarily when waking in the am. She has chronic SOB with more than mild exertion. Recent testing has been reassuring. She has a stitch that has poked through on her sternotomy several times prior, and she feels like it is again.   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   "right"   Mitral regurgitation    Osteopenia 02/18/2012   Osteoporosis    PAF (paroxysmal atrial fibrillation) ([place]) 2012   Peripheral neuropathy 11/06/2011   PMDD (premenstrual dysphoric disorder)    PONV (postoperative nausea and vomiting)    nausea, vomiting, hives and dizziness    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/2020   Complete bilateral atrial lesion set using cryothermy and bipolar radiofrequency ablation with clipping of LA appendage via right mini-thoracotomy approach   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   Complex valvuloplasty including artificial Gore-tex neochord placement x12 with 25m Sorin Memo 4D ring annuloplasty   Seasonal allergies    Vaginal delivery     ONE NSVD   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   Past Surgical History:  Procedure Laterality Date   ANTERIOR CERVICAL DECOMP/DISCECTOMY FUSION  ~ 2003   BACK SURGERY     BREAST SURGERY     BREAST BIOPSY--RIGHT BENIGN   BUNIONECTOMY Bilateral    CARDIOVERSION N/A 01/26/2020   Procedure: CARDIOVERSION;  Surgeon: OGeralynn Rile MD;  [facility]  Service: Cardiovascular;  Laterality: N/A;   COSMETIC SURGERY  2016   "back of my neck; [facility] to [facility]   [facility]    "removed bone protruding out of my forehead"   HARDWARE REMOVAL  2016   "related to neck OR"   INCONTINENCE SURGERY     IR THORACENTESIS ASP PLEURAL SPACE W/IMG GUIDE  02/16/2020   MINIMALLY INVASIVE MAZE PROCEDURE N/A 01/17/2020   Procedure: MINIMALLY INVASIVE MAZE PROCEDURE;  Surgeon: ORexene Alberts MD;  [facility]  Service: Open Heart Surgery;  Laterality: N/A;   MITRAL VALVE REPAIR Right 01/17/2020   Procedure: MINIMALLY INVASIVE MITRAL VALVE REPAIR (MVR) USING MEMO 4D 32MM;  Surgeon: ORexene Alberts MD;  [facility]  Service: Open Heart Surgery;  Laterality: Right;   POSTERIOR CERVICAL FUSION/FORAMINOTOMY  ~ 2008; 2015   RIGHT/LEFT HEART CATH AND CORONARY ANGIOGRAPHY N/A 12/02/2019   Procedure: RIGHT/LEFT HEART CATH AND CORONARY ANGIOGRAPHY;  Surgeon: MLauree Chandler  D, MD;  Location: [facility];  Service: Cardiovascular;  Laterality: N/A;   SHOULDER ARTHROSCOPY W/ ROTATOR CUFF REPAIR Right 2012   SPINAL FUSION  06/2014 - 2018 X ?7   "scoliosis; my entire back"   TEE WITHOUT CARDIOVERSION N/A 11/21/2019   Procedure: TRANSESOPHAGEAL ECHOCARDIOGRAM (TEE);  Surgeon: Josue Hector, MD;  Location[facility];  Service: Cardiovascular;  Laterality: N/A;   TEE WITHOUT CARDIOVERSION N/A 01/17/2020   Procedure: TRANSESOPHAGEAL ECHOCARDIOGRAM (TEE);  Surgeon: Rexene Alberts,  MD;  Location: [facility];  Service: Open Heart Surgery;  Laterality: N/A;   TEE WITHOUT CARDIOVERSION N/A 01/26/2020   Procedure: TRANSESOPHAGEAL ECHOCARDIOGRAM (TEE);  Surgeon: Geralynn Rile, MD;  Location: [facility];  Service: Cardiovascular;  Laterality: N/A;   TUBAL LIGATION     VAGINAL HYSTERECTOMY     TVH    Current Outpatient Medications  Medication Sig Dispense Refill   acetaminophen (TYLENOL) 500 MG tablet Take 500 mg by mouth every 6 (six) hours as needed for moderate pain.      b complex vitamins tablet Take 1 tablet by mouth daily.     Calcium Carbonate Antacid (TUMS CHEWY BITES PO) Take 1 tablet by mouth daily as needed (reflux).      Calcium Citrate-Vitamin D (CALCIUM + D PO) Take 1 tablet by mouth daily.     Carboxymethylcellul-Glycerin (LUBRICATING EYE DROPS OP) Place 1 drop into both eyes daily as needed (dry eyes).     cyclobenzaprine (FLEXERIL) 10 MG tablet Take 10 mg by mouth at bedtime as needed for muscle spasms.   5   denosumab (PROLIA) 60 MG/ML SOSY injection Inject 60 mg into the skin every 6 (six) months.     EUTHYROX 75 MCG tablet Take 1 tablet by mouth once daily 90 tablet 0   furosemide (LASIX) 20 MG tablet Take 1 tablet (20 mg total) by mouth daily. 90 tablet 3   meclizine (ANTIVERT) 25 MG tablet TAKE 1 TABLET BY MOUTH THREE TIMES DAILY AS NEEDED FOR DIZZINESS 30 tablet 0   metoprolol tartrate (LOPRESSOR) 50 MG tablet Take 1 tablet (50 mg total) by mouth 2 (two) times daily. 180 tablet 3   Nutritional Supplements (,FEEDING SUPPLEMENT, PROSOURCE PLUS) liquid Take 30 mLs by mouth 2 (two) times daily between meals. 887 mL 0   nystatin (MYCOSTATIN/NYSTOP) powder Apply topically 2 (two) times daily as needed (skin irritation (under breasts)). 15 g 0   ondansetron (ZOFRAN) 4 MG tablet Take 1 tablet (4 mg total) by mouth every 8 (eight) hours as needed. 30 tablet 0   oxyCODONE (ROXICODONE) 5 MG/5ML solution Take 4 mLs (4 mg total) by mouth every 4 (four) hours as  needed for moderate pain.  0   polyethylene glycol (MIRALAX / GLYCOLAX) packet Take 17 g by mouth daily as needed for mild constipation.     rivaroxaban (XARELTO) 20 MG TABS tablet Take 1 tablet (20 mg total) by mouth daily with supper. 30 tablet 6   sodium chloride (OCEAN) 0.65 % SOLN nasal spray Place 1 spray into both nostrils as needed for congestion (nose bleeds).     triamcinolone ointment (KENALOG) 0.1 % Apply 1 application topically 2 (two) times daily. 90 g 1   valACYclovir (VALTREX) 500 MG tablet Take 500 mg by mouth daily as needed (breakouts).      clonazePAM (KLONOPIN) 0.5 MG tablet Take 0.5 mg by mouth as needed.     No current facility-administered medications for this visit.  Allergies  Allergen Reactions   Buprenorphine Nausea Only and Other (See Comments)    sedation and adhesive reaction Other reaction(s): nausea, sedation and adhesive reaction   Cymbalta [Duloxetine Hcl]     Severe diarrhea and upset stomach   Erythromycin Nausea And Vomiting    Dizziness    Hydrocodone Other (See Comments)    Sedation,dizziness, and nausea Other reaction(s): sedation and nausea Other reaction(s): sedation and nausea   Hydromorphone Other (See Comments)    Sedation,dizziness, and nausea Other reaction(s): sedation and nausea Other reaction(s): sedation and nausea   Iodinated Diagnostic Agents Hives and Nausea Only   Iodine     unknown   Levofloxacin Nausea And Vomiting   Metrizamide Hives   Nsaids     CANNOT TAKE PER CARDIOLOGIST DUE TO AFIB    Nucynta [Tapentadol Hcl]     nausea and sedation   Nucynta [Tapentadol] Nausea Only   Other Other (See Comments)    CAN NOT TAKE , aLPRAZOLAM, OR ELAVIL DUE TO AFIB FOR ANXIETY  IV CONTRAST /"DYE". Other reaction(s): hives and sores   Oxycodone Other (See Comments)    Delusions (intolerance)  PILLS ONLY sedation, dizziness, nausea,  and itching Other reaction(s): sedation and nausea Other reaction(s): sedation and nausea    Pentazocine Other (See Comments)    Unknown Other reaction(s): Unknown Other reaction(s): Unknown   Septra [Bactrim]     Hives    Sulfasalazine Hives   Adhesive [Tape] Rash and Other (See Comments)    Heart monitor stickers must be rotated in order to prevent rash   Latex Palpitations and Other (See Comments)     hives and sores Other reaction(s): hives and sores   Morphine Anxiety and Other (See Comments)    sedation and nausea Other reaction(s): sedation and nausea Other reaction(s): sedation and nausea   Sulfa Antibiotics Hives, Nausea Only and Rash    rash    Social History   Socioeconomic History   Marital status: Divorced    Spouse name: none/divorced   Number of children: Not on file   Years of education: Not on file   Highest education level: 12th grade  Occupational History   Occupation: retired  Tobacco Use   Smoking status: Former    Packs/day: 0.10    Years: 14.00    Pack years: 1.40    Types: Cigarettes    Quit date: 1980    Years since quitting: 42.7   Smokeless tobacco: Never   Tobacco comments:    03/31/2018 "quit ~ 1980; someday smoker when I did smoke; never addicted"  Vaping Use   Vaping Use: Never used  Substance and Sexual Activity   Alcohol use: Never   Drug use: Yes    Types: Oxycodone, Benzodiazepines    Comment: 03/31/2018 "for chronic neck and back pain", takes Klonopin at times.    Sexual activity: Not Currently  Other Topics Concern   Not on file  Social History Narrative   Lives by herself.    Social Determinants of Health   Financial Resource Strain: Low Risk    Difficulty of Paying Living Expenses: Not hard at all  Food Insecurity: No Food Insecurity   Worried About Charity fundraiser in the Last Year: Never true   [place] in the Last Year: Never true  Transportation Needs: No Transportation Needs   Lack of Transportation (Medical): No   Lack of Transportation (Non-Medical): No  Physical Activity: Insufficiently  Active   Days  of Exercise per Week: 3 days   Minutes of Exercise per Session: 20 min  Stress: No Stress Concern Present   Feeling of Stress : Only a little  Social Connections: Moderately Isolated   Frequency of Communication with Friends and Family: More than three times a week   Frequency of Social Gatherings with Friends and Family: Once a week   Attends Religious Services: 1 to 4 times per year   Active Member of [employer] or Organizations: No   Attends Archivist Meetings: Never   Marital Status: Divorced  Human resources officer Violence: Not At Risk   Fear of Current or Ex-Partner: No   Emotionally Abused: No   Physically Abused: No   Sexually Abused: No     Review of Systems: All other systems reviewed and are otherwise negative except as noted above.  Physical Exam: Vitals:   03/25/21 1214  BP: 124/68  Pulse: 65  SpO2: 95%  Weight: 113 lb 12.8 oz (51.6 kg)  Height: '5\' 6"'$  (1.676 m)    GEN- The patient is well appearing, alert and oriented x 3 today.   HEENT: normocephalic, atraumatic; sclera clear, conjunctiva pink; hearing intact; oropharynx clear; neck supple, no JVP Lymph- no cervical lymphadenopathy Lungs- Clear to ausculation bilaterally, normal work of breathing.  No wheezes, rales, rhonchi Heart- Regular rate and rhythm, no murmurs, rubs or gallops, PMI not laterally displaced GI- soft, non-tender, non-distended, bowel sounds present, no hepatosplenomegaly Extremities- no clubbing, cyanosis, or edema; DP/PT/radial pulses 2+ bilaterally MS- no significant deformity or atrophy Skin- warm and dry, no rash or lesion Psych- euthymic mood, full affect Neuro- strength and sensation are intact  EKG is ordered. Personal review of EKG from today shows appears to show NSR but with very noisy baseline  Additional studies reviewed include: Previous EP office notes.   Assessment and Plan:  1.  Paroxysmal atrial fibrillation:  Continue Xarelto  CHA2DS2-VASc of  3. EKG appears to show NSR though baseline very difficult.     2.  HTN Stable on current regimen    3.  Severe mitral regurgitation: Status post mitral valve repair.   Stable by most recent echo.    4.  Shortness of breath:  Echo 10/2020 with LVEF 50-55% and stable valve.   5. Hernia surgery I think she would likely be at least moderate risk given her co-morbidities, but with recent stable Echo, Do not foresee this being prohibitive. Surgery not officially schedule nor has cardiac clearance been yet requested.   6. ? Retained suture Have encouraged follow up with [facility].  Shirley Friar, PA-C  03/25/21 12:20 PM

## 2021-03-25 NOTE — Patient Instructions (Signed)
Medication Instructions:  Your physician recommends that you continue on your current medications as directed. Please refer to the Current Medication list given to you today.  *If you need a refill on your cardiac medications before your next appointment, please call your pharmacy*   Lab Work: None If you have labs (blood work) drawn today and your tests are completely normal, you will receive your results only by: [place] (if you have MyChart) OR A paper copy in the mail If you have any lab test that is abnormal or we need to change your treatment, we will call you to review the results.   Follow-Up: At [facility], you and your health needs are our priority.  As part of our continuing mission to provide you with exceptional heart care, we have created designated Provider Care Teams.  These Care Teams include your primary Cardiologist (physician) and Advanced Practice Providers (APPs -  Physician Assistants and Nurse Practitioners) who all work together to provide you with the care you need, when you need it.  Your next appointment:   9 month(s)  The format for your next appointment:   In Person  Provider:   You may see Will Meredith Leeds, MD or one of the following Advanced Practice Providers on your designated Care Team:   [name], [place] Legrand Como "[facility]" [place], [place]

## 2021-03-26 ENCOUNTER — Telehealth: Payer: Self-pay | Admitting: *Deleted

## 2021-03-26 DIAGNOSIS — K4091 Unilateral inguinal hernia, without obstruction or gangrene, recurrent: Secondary | ICD-10-CM | POA: Diagnosis not present

## 2021-03-26 NOTE — Telephone Encounter (Signed)
Patient contacted the office c/o suture poking through superior aspect of sternal incision. Patient s/p sternotomy 01/2020 by Dr. Roxy Manns. Patient was seen in clinic April this year for trimming of suture. Patient states site has not gotten any better since that appt. States area around suture is still red and yellow. Patient can not tell whether pus is present around suture but states it is not draining. Appt made for f/u with PA on Thursday. Patient verbalizes understanding.

## 2021-03-28 ENCOUNTER — Other Ambulatory Visit: Payer: Self-pay

## 2021-03-28 ENCOUNTER — Ambulatory Visit (INDEPENDENT_AMBULATORY_CARE_PROVIDER_SITE_OTHER): Payer: Medicare Other | Admitting: Physician Assistant

## 2021-03-28 VITALS — BP 115/67 | HR 77 | Resp 20 | Ht 66.0 in | Wt 113.0 lb

## 2021-03-28 DIAGNOSIS — T82598A Other mechanical complication of other cardiac and vascular devices and implants, initial encounter: Secondary | ICD-10-CM | POA: Diagnosis not present

## 2021-03-28 DIAGNOSIS — Z9889 Other specified postprocedural states: Secondary | ICD-10-CM

## 2021-03-28 NOTE — Progress Notes (Signed)
[address]       [place] [address]             [phone]        [name] is a 75 y.o. female patient s/p mitral valve repair on 01/17/2020. She has been back to the office a few times for a stitch at the top of her sternotomy incision that has been bothering her.   Today, she presents for a wound check.   1. 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   "right"   Mitral regurgitation    Osteopenia 02/18/2012   Osteoporosis    PAF (paroxysmal atrial fibrillation) ([place]) 2012   Peripheral neuropathy 11/06/2011   PMDD (premenstrual dysphoric disorder)    PONV (postoperative nausea and vomiting)    nausea, vomiting, hives and dizziness    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/2020   Complete bilateral atrial lesion set using cryothermy and bipolar radiofrequency ablation with clipping of LA appendage via right mini-thoracotomy approach   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 Scheduled Meds: Current Outpatient Medications on File Prior to Visit  Medication Sig Dispense Refill   acetaminophen (TYLENOL) 500 MG tablet Take 500 mg by mouth every 6 (six) hours as needed  for moderate pain.      b complex vitamins tablet Take 1 tablet by mouth daily.     busPIRone (BUSPAR) 5 MG tablet buspirone 5 mg tablet     Calcium Carbonate Antacid (TUMS CHEWY BITES PO) Take 1 tablet by mouth daily as needed (reflux).      Calcium Citrate-Vitamin D (CALCIUM + D PO) Take 1 tablet by mouth daily.     Carboxymethylcellul-Glycerin (LUBRICATING EYE DROPS OP) Place 1 drop into both eyes daily as needed (dry eyes).     clonazePAM (KLONOPIN) 0.5 MG tablet Take 0.5 mg by mouth as needed.     cyclobenzaprine (FLEXERIL) 10 MG tablet Take 10 mg by mouth at bedtime as needed for muscle spasms.   5   denosumab (PROLIA) 60 MG/ML SOSY injection Inject 60 mg into the skin every 6 (six) months.     EUTHYROX 75 MCG tablet Take 1 tablet by mouth once daily 90 tablet 0   furosemide (LASIX) 20 MG tablet Take 1 tablet (20 mg total) by mouth daily. 90 tablet 3   meclizine (ANTIVERT) 25 MG tablet TAKE 1 TABLET BY MOUTH THREE TIMES DAILY AS NEEDED FOR DIZZINESS 30 tablet 0   metoprolol tartrate (LOPRESSOR) 50 MG tablet Take 1 tablet (50 mg total) by mouth 2 (  two) times daily. 180 tablet 3   Nutritional Supplements (,FEEDING SUPPLEMENT, PROSOURCE PLUS) liquid Take 30 mLs by mouth 2 (two) times daily between meals. 887 mL 0   nystatin (MYCOSTATIN/NYSTOP) powder Apply topically 2 (two) times daily as needed (skin irritation (under breasts)). 15 g 0   ondansetron (ZOFRAN) 4 MG tablet Take 1 tablet (4 mg total) by mouth every 8 (eight) hours as needed. 30 tablet 0   oxyCODONE (ROXICODONE) 5 MG/5ML solution Take 4 mLs (4 mg total) by mouth every 4 (four) hours as needed for moderate pain.  0   polyethylene glycol (MIRALAX / GLYCOLAX) packet Take 17 g by mouth daily as needed for mild constipation.     rivaroxaban (XARELTO) 20 MG TABS tablet Take 1 tablet (20 mg total) by mouth daily with supper. 30 tablet 6   sodium chloride (OCEAN) 0.65 % SOLN nasal spray Place 1 spray into both nostrils as needed for  congestion (nose bleeds).     triamcinolone ointment (KENALOG) 0.1 % Apply 1 application topically 2 (two) times daily. 90 g 1   valACYclovir (VALTREX) 500 MG tablet Take 500 mg by mouth daily as needed (breakouts).      No current facility-administered medications on file prior to visit.     Allergies  Allergen Reactions   Buprenorphine Nausea Only and Other (See Comments)    sedation and adhesive reaction Other reaction(s): nausea, sedation and adhesive reaction   Cymbalta [Duloxetine Hcl]     Severe diarrhea and upset stomach   Erythromycin Nausea And Vomiting    Dizziness    Hydrocodone Other (See Comments)    Sedation,dizziness, and nausea Other reaction(s): sedation and nausea Other reaction(s): sedation and nausea   Hydromorphone Other (See Comments)    Sedation,dizziness, and nausea Other reaction(s): sedation and nausea Other reaction(s): sedation and nausea   Iodinated Diagnostic Agents Hives and Nausea Only   Iodine     unknown   Levofloxacin Nausea And Vomiting   Metrizamide Hives   Nsaids     CANNOT TAKE PER CARDIOLOGIST DUE TO AFIB    Nucynta [Tapentadol Hcl]     nausea and sedation   Nucynta [Tapentadol] Nausea Only   Other Other (See Comments)    CAN NOT TAKE , aLPRAZOLAM, OR ELAVIL DUE TO AFIB FOR ANXIETY  IV CONTRAST /"DYE". Other reaction(s): hives and sores   Oxycodone Other (See Comments)    Delusions (intolerance)  PILLS ONLY sedation, dizziness, nausea,  and itching Other reaction(s): sedation and nausea Other reaction(s): sedation and nausea   Pentazocine Other (See Comments)    Unknown Other reaction(s): Unknown Other reaction(s): Unknown   Septra [Bactrim]     Hives    Sulfasalazine Hives   Adhesive [Tape] Rash and Other (See Comments)    Heart monitor stickers must be rotated in order to prevent rash   Latex Palpitations and Other (See Comments)     hives and sores Other reaction(s): hives and sores   Morphine Anxiety and Other (See  Comments)    sedation and nausea Other reaction(s): sedation and nausea Other reaction(s): sedation and nausea   Sulfa Antibiotics Hives, Nausea Only and Rash    rash   Active Problems:   * No active hospital problems. *  Blood pressure 115/67, pulse 77, resp. rate 20, height 5\' 6"  (1.676 m), weight 113 lb (51.3 kg), SpO2 95 %.  Wound: stitch pulled out and surrounding crusted over drainage (very small amount). Cleaned well with betadine and covered with  a 2 x 2 dressing and tape.   Subjective Objective: Vital signs (most recent): Blood pressure 115/67, pulse 77, resp. rate 20, height 5\' 6"  (1.676 m), weight 113 lb (51.3 kg), SpO2 95 %. Assessment & Plan  We discussed her wound and her continued issues with a stitch. I think I got the whole stitch removed today and the surrounding material. It is a little red but does not overall look infected. No fevers or chills.   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 I think the patient was a little confused by this diagnosis so I reviewed her most recent echocardiogram and let her know it can be controlled with medication. I recommended follow-up with her cardiologist.   I recommend washing the incision with soap and water. She can follow-up PRN.   [name] 03/28/2021

## 2021-04-01 ENCOUNTER — Ambulatory Visit: Payer: Medicare Other | Admitting: Family Medicine

## 2021-04-01 DIAGNOSIS — M810 Age-related osteoporosis without current pathological fracture: Secondary | ICD-10-CM | POA: Diagnosis not present

## 2021-04-01 LAB — HM DEXA SCAN: HM Dexa Scan: NEGATIVE

## 2021-04-02 ENCOUNTER — Encounter: Payer: Self-pay | Admitting: *Deleted

## 2021-04-02 ENCOUNTER — Telehealth: Payer: Self-pay | Admitting: Cardiology

## 2021-04-02 NOTE — Telephone Encounter (Signed)
Clinical references on diastolic HF sent to the pts active mychart account to review.  Pt will see Dr. Johney Frame as scheduled in Feb 2023.

## 2021-04-02 NOTE — Telephone Encounter (Signed)
New message:      Patient calling stating that she would like to speak with Judeen Hammans the nurse concerning her heart. I ask the patient could she let me know what was going on she said she did not want to keep repeating. She would like to speak with Sherrie

## 2021-04-02 NOTE — Telephone Encounter (Signed)
Spent 34 minutes on phone with pt Pt tearful to begin conversation. She states that she recently saw [facility] who told her she had CHF. States she is has never been told this before, "never knew nothing of heart failure and never hear the word".  She would like to know what is wrong with her heart.  She is afraid if she has surgery she could die from her heart issues. Pt educated as to heart failure is not new for her, she has had this for years. She is afraid to be put to sleep thinking she will die b/c HF.  Educated to her heart failure some, but explained that she has had many procedures in the last few years and she had HF then -- this seemed to make pt rethink concerns. She was "thrown for a loop" when they said she had CHF. Aware this should be further discussed with her general cardiologist next time she sees her next year. Aware will forward to nurse to send some education material on HF for pt to read up on. Pt feels more at ease and looking forward to information.

## 2021-04-03 ENCOUNTER — Ambulatory Visit: Payer: Medicare Other | Admitting: Family Medicine

## 2021-04-03 DIAGNOSIS — K5903 Drug induced constipation: Secondary | ICD-10-CM | POA: Insufficient documentation

## 2021-04-03 DIAGNOSIS — T402X5A Adverse effect of other opioids, initial encounter: Secondary | ICD-10-CM | POA: Insufficient documentation

## 2021-04-10 ENCOUNTER — Ambulatory Visit: Payer: Medicare Other | Admitting: Family Medicine

## 2021-04-15 DIAGNOSIS — G894 Chronic pain syndrome: Secondary | ICD-10-CM | POA: Diagnosis not present

## 2021-04-15 DIAGNOSIS — Z79891 Long term (current) use of opiate analgesic: Secondary | ICD-10-CM | POA: Diagnosis not present

## 2021-04-15 DIAGNOSIS — M4693 Unspecified inflammatory spondylopathy, cervicothoracic region: Secondary | ICD-10-CM | POA: Diagnosis not present

## 2021-04-15 DIAGNOSIS — M961 Postlaminectomy syndrome, not elsewhere classified: Secondary | ICD-10-CM | POA: Diagnosis not present

## 2021-04-15 DIAGNOSIS — M5412 Radiculopathy, cervical region: Secondary | ICD-10-CM | POA: Diagnosis not present

## 2021-04-17 ENCOUNTER — Encounter: Payer: Self-pay | Admitting: Family Medicine

## 2021-04-17 ENCOUNTER — Other Ambulatory Visit: Payer: Self-pay

## 2021-04-17 ENCOUNTER — Ambulatory Visit (INDEPENDENT_AMBULATORY_CARE_PROVIDER_SITE_OTHER): Payer: Medicare Other | Admitting: Family Medicine

## 2021-04-17 VITALS — BP 128/84 | HR 60 | Temp 96.8°F | Resp 20 | Ht 66.0 in | Wt 118.6 lb

## 2021-04-17 DIAGNOSIS — F32A Depression, unspecified: Secondary | ICD-10-CM

## 2021-04-17 DIAGNOSIS — F419 Anxiety disorder, unspecified: Secondary | ICD-10-CM

## 2021-04-17 DIAGNOSIS — M810 Age-related osteoporosis without current pathological fracture: Secondary | ICD-10-CM

## 2021-04-17 DIAGNOSIS — E039 Hypothyroidism, unspecified: Secondary | ICD-10-CM

## 2021-04-17 LAB — T4, FREE: Free T4: 1.58 ng/dL (ref 0.60–1.60)

## 2021-04-17 LAB — T3, FREE: T3, Free: 2.8 pg/mL (ref 2.3–4.2)

## 2021-04-17 LAB — TSH: TSH: 0.22 u[IU]/mL — ABNORMAL LOW (ref 0.35–5.50)

## 2021-04-17 NOTE — Patient Instructions (Addendum)
Follow up in 6 months to recheck thyroid We'll notify you of your lab results and make any changes if needed I'll look at our anxiety/depression options and let you know Proceed w/ the Prolia injection tomorrow Go ahead w/ the cologuard and see how it goes Call with any questions or concerns Hang in there!!

## 2021-04-17 NOTE — Progress Notes (Signed)
   Subjective:    Patient ID: [name], female    DOB: [birth year], 75 y.o.   MRN: [id number]  HPI 'i have not been doing well'- pt report a lot of pain, 'a lot of problems going on'.  Sees pain management.    Pt has Prolia scheduled for tomorrow but is very anxious about this b/c of risk of osteonecrosis.  Has seen a dental specialist and had xrays and there is no evidence of osteonecrosis.    'i'm so stressed'  Xarelto is doing away w/ pt assistance.  Pt is taking Clonazepam 1/2 tab daily- 'you'll still let me have that, right?'  Pt reports she has tried Buspar multiple times but this causes diarrhea.  GI wants pt to have cologuard but told her that if she has a + test she would need a colonoscopy.  Pt doesn't know how to proceed.  Hypothyroid- currently on Euthyrox 65mcg daily.  + fatigue.   Review of Systems For ROS see HPI   This visit occurred during the SARS-CoV-2 public health emergency.  Safety protocols were in place, including screening questions prior to the visit, additional usage of staff PPE, and extensive cleaning of exam room while observing appropriate contact time as indicated for disinfecting solutions.      Objective:   Physical Exam Vitals reviewed.  Constitutional:      General: She is not in acute distress.    Comments: frail  HENT:     Head: Normocephalic and atraumatic.  Eyes:     Extraocular Movements: Extraocular movements intact.     Conjunctiva/sclera: Conjunctivae normal.     Pupils: Pupils are equal, round, and reactive to light.  Pulmonary:     Effort: Pulmonary effort is normal. No respiratory distress.     Breath sounds: No wheezing.  Musculoskeletal:     Cervical back: Rigidity (due to hardware in spine) present.     Right lower leg: No edema.     Left lower leg: No edema.  Lymphadenopathy:     Cervical: No cervical adenopathy.  Skin:    General: Skin is warm and dry.  Neurological:     Mental Status: She is alert and oriented to  person, place, and time.  Psychiatric:     Comments: Tearful, anxious           Assessment & Plan:

## 2021-04-18 DIAGNOSIS — M810 Age-related osteoporosis without current pathological fracture: Secondary | ICD-10-CM | POA: Diagnosis not present

## 2021-04-19 DIAGNOSIS — Z23 Encounter for immunization: Secondary | ICD-10-CM | POA: Diagnosis not present

## 2021-04-21 DIAGNOSIS — Z1212 Encounter for screening for malignant neoplasm of rectum: Secondary | ICD-10-CM | POA: Diagnosis not present

## 2021-04-21 DIAGNOSIS — Z1211 Encounter for screening for malignant neoplasm of colon: Secondary | ICD-10-CM | POA: Diagnosis not present

## 2021-04-22 ENCOUNTER — Encounter: Payer: Self-pay | Admitting: Family Medicine

## 2021-04-25 ENCOUNTER — Encounter: Payer: Self-pay | Admitting: Cardiology

## 2021-04-25 ENCOUNTER — Other Ambulatory Visit: Payer: Self-pay

## 2021-04-25 ENCOUNTER — Telehealth: Payer: Self-pay | Admitting: Cardiology

## 2021-04-25 ENCOUNTER — Ambulatory Visit (INDEPENDENT_AMBULATORY_CARE_PROVIDER_SITE_OTHER): Payer: Medicare Other | Admitting: Cardiology

## 2021-04-25 VITALS — BP 120/70 | HR 73 | Ht 66.0 in | Wt 117.0 lb

## 2021-04-25 DIAGNOSIS — I480 Paroxysmal atrial fibrillation: Secondary | ICD-10-CM

## 2021-04-25 DIAGNOSIS — Z8679 Personal history of other diseases of the circulatory system: Secondary | ICD-10-CM

## 2021-04-25 DIAGNOSIS — Z9889 Other specified postprocedural states: Secondary | ICD-10-CM

## 2021-04-25 NOTE — Patient Instructions (Signed)
Medication Instructions:  The current medical regimen is effective;  continue present plan and medications.  *If you need a refill on your cardiac medications before your next appointment, please call your pharmacy*  Follow-Up: At [facility], you and your health needs are our priority.  As part of our continuing mission to provide you with exceptional heart care, we have created designated Provider Care Teams.  These Care Teams include your primary Cardiologist (physician) and Advanced Practice Providers (APPs -  Physician Assistants and Nurse Practitioners) who all work together to provide you with the care you need, when you need it.  We recommend signing up for the patient portal called "MyChart".  Sign up information is provided on this After Visit Summary.  MyChart is used to connect with patients for Virtual Visits (Telemedicine).  Patients are able to view lab/test results, encounter notes, upcoming appointments, etc.  Non-urgent messages can be sent to your provider as well.   To learn more about what you can do with MyChart, go to [email or web address].    Your next appointment:   Follow up as previously instructed.  Thank you for choosing [facility]!!

## 2021-04-25 NOTE — Assessment & Plan Note (Signed)
Stable  No change

## 2021-04-25 NOTE — Assessment & Plan Note (Signed)
After auscultation, she sounded like she was back in normal rhythm and this was confirmed by EKG.  Reassurance.  She definitely has atrial fibrillation that is paroxysmal.  She had maze procedure in April.  Continue with metoprolol as well as Xarelto.  [name] may wish to trial antiarrhythmic if necessary in the future.  I did state that sometimes less medicine is better.

## 2021-04-25 NOTE — Progress Notes (Signed)
Cardiology Office Note:    Date:  04/25/2021   ID:  [name], [name] [birth year], MRN [id number]  PCP:  [name], MD   [facility] Providers Cardiologist:  Candee Furbish, MD Electrophysiologist:  Will Meredith Leeds, MD     Referring MD: [name], MD    History of Present Illness:    [name] is a 75 y.o. female here for evaluation of AFIB. Can feel it for several weeks.  She feels it off and on.  This does make her feel nervous.  She does state that over the last 8 years she has felt poorly, stressed.  Has had neck surgery that is had complications.  In April had her mitral valve replaced and maze procedure, Dr. Roxy Manns.  Quite nervous at times.  She does state that she takes a half of Klonopin at times if she feels the palpitations and this helps her calm down.  Past Medical History:  Diagnosis Date   Allergies    Anxiety    Arthritis    "maybe in my back" (03/31/2018)   Benign paroxysmal positional vertigo 22/29/7989   Complication of anesthesia    Fracture of multiple ribs 2015   "don't know from what; dx'd when I in hospital for 1st back OR" (03/31/2018)   GERD (gastroesophageal reflux disease)    Hair loss 04/12/2012   Herpes    History of blood transfusion    "twice; related to back OR" (03/31/2018)   History of kidney stones    Interstitial cystitis 11/06/2011   Melanoma of ankle ([place]) ~ 2003   "right"   Mitral regurgitation    Osteopenia 02/18/2012   Osteoporosis    PAF (paroxysmal atrial fibrillation) ([place]) 2012   Peripheral neuropathy 11/06/2011   PMDD (premenstrual dysphoric disorder)    PONV (postoperative nausea and vomiting)    nausea, vomiting, hives and dizziness    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/2020   Complete bilateral atrial lesion set using cryothermy and bipolar radiofrequency ablation with clipping of LA appendage via right mini-thoracotomy approach   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   Complex  valvuloplasty including artificial Gore-tex neochord placement x12 with 16mm Sorin Memo 4D ring annuloplasty   Seasonal allergies    Vaginal delivery    ONE NSVD   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    Past Surgical History:  Procedure Laterality Date   ANTERIOR CERVICAL DECOMP/DISCECTOMY FUSION  ~ 2003   BACK SURGERY     BREAST SURGERY     BREAST BIOPSY--RIGHT BENIGN   BUNIONECTOMY Bilateral    CARDIOVERSION N/A 01/26/2020   Procedure: CARDIOVERSION;  Surgeon: Geralynn Rile, MD;  Location: [place];  Service: Cardiovascular;  Laterality: N/A;   COSMETIC SURGERY  2016   "back of my neck; related to earlier fusion"   [place]  "several times"   [place] Right    "removed bone protruding out of my forehead"   HARDWARE REMOVAL  2016   "related to neck OR"   INCONTINENCE SURGERY     IR THORACENTESIS ASP PLEURAL SPACE W/IMG GUIDE  02/16/2020   MINIMALLY INVASIVE MAZE PROCEDURE N/A 01/17/2020   Procedure: MINIMALLY INVASIVE MAZE PROCEDURE;  Surgeon: Rexene Alberts, MD;  Location: [place];  Service: Open Heart Surgery;  Laterality: N/A;   MITRAL VALVE REPAIR Right 01/17/2020   Procedure: MINIMALLY INVASIVE MITRAL VALVE REPAIR (MVR) USING MEMO 4D 32MM;  Surgeon:  Rexene Alberts, MD;  Location: [place];  Service: Open Heart Surgery;  Laterality: Right;   POSTERIOR CERVICAL FUSION/FORAMINOTOMY  ~ 2008; 2015   RIGHT/LEFT HEART CATH AND CORONARY ANGIOGRAPHY N/A 12/02/2019   Procedure: RIGHT/LEFT HEART CATH AND CORONARY ANGIOGRAPHY;  Surgeon: Burnell Blanks, MD;  Location: [place] [facility];  Service: Cardiovascular;  Laterality: N/A;   SHOULDER ARTHROSCOPY W/ ROTATOR CUFF REPAIR Right 2012   SPINAL FUSION  06/2014 - 2018 X ?7   "scoliosis; my entire back"   TEE WITHOUT CARDIOVERSION N/A 11/21/2019   Procedure: TRANSESOPHAGEAL ECHOCARDIOGRAM (TEE);  Surgeon: Josue Hector, MD;  Location: [facility];  Service:  Cardiovascular;  Laterality: N/A;   TEE WITHOUT CARDIOVERSION N/A 01/17/2020   Procedure: TRANSESOPHAGEAL ECHOCARDIOGRAM (TEE);  Surgeon: Rexene Alberts, MD;  Location: [place];  Service: Open Heart Surgery;  Laterality: N/A;   TEE WITHOUT CARDIOVERSION N/A 01/26/2020   Procedure: TRANSESOPHAGEAL ECHOCARDIOGRAM (TEE);  Surgeon: Geralynn Rile, MD;  Location: [place];  Service: Cardiovascular;  Laterality: N/A;   TUBAL LIGATION     VAGINAL HYSTERECTOMY     TVH    Current Medications: Current Meds  Medication Sig   acetaminophen (TYLENOL) 500 MG tablet Take 500 mg by mouth every 6 (six) hours as needed for moderate pain.    b complex vitamins tablet Take 1 tablet by mouth daily.   Calcium Carbonate Antacid (TUMS CHEWY BITES PO) Take 1 tablet by mouth daily as needed (reflux).    Calcium Citrate-Vitamin D (CALCIUM + D PO) Take 1 tablet by mouth daily.   Carboxymethylcellul-Glycerin (LUBRICATING EYE DROPS OP) Place 1 drop into both eyes daily as needed (dry eyes).   clonazePAM (KLONOPIN) 0.5 MG tablet Take 0.5 mg by mouth as needed.   cyclobenzaprine (FLEXERIL) 10 MG tablet Take 10 mg by mouth at bedtime as needed for muscle spasms.    denosumab (PROLIA) 60 MG/ML SOSY injection Inject 60 mg into the skin every 6 (six) months.   EUTHYROX 75 MCG tablet Take 1 tablet by mouth once daily   furosemide (LASIX) 20 MG tablet Take 1 tablet (20 mg total) by mouth daily.   meclizine (ANTIVERT) 25 MG tablet TAKE 1 TABLET BY MOUTH THREE TIMES DAILY AS NEEDED FOR DIZZINESS   metoprolol tartrate (LOPRESSOR) 50 MG tablet Take 1 tablet (50 mg total) by mouth 2 (two) times daily.   Nutritional Supplements (,FEEDING SUPPLEMENT, PROSOURCE PLUS) liquid Take 30 mLs by mouth 2 (two) times daily between meals.   nystatin (MYCOSTATIN/NYSTOP) powder Apply topically 2 (two) times daily as needed (skin irritation (under breasts)).   ondansetron (ZOFRAN) 4 MG tablet Take 1 tablet (4 mg total) by mouth every 8  (eight) hours as needed.   oxyCODONE (ROXICODONE) 5 MG/5ML solution Take 4 mLs (4 mg total) by mouth every 4 (four) hours as needed for moderate pain.   polyethylene glycol (MIRALAX / GLYCOLAX) packet Take 17 g by mouth daily as needed for mild constipation.   rivaroxaban (XARELTO) 20 MG TABS tablet Take 1 tablet (20 mg total) by mouth daily with supper.   sodium chloride (OCEAN) 0.65 % SOLN nasal spray Place 1 spray into both nostrils as needed for congestion (nose bleeds).   triamcinolone ointment (KENALOG) 0.1 % Apply 1 application topically 2 (two) times daily.   valACYclovir (VALTREX) 500 MG tablet Take 500 mg by mouth daily as needed (breakouts).      Allergies:   Buprenorphine, Cymbalta [duloxetine hcl], Erythromycin, Hydrocodone, Hydromorphone, Iodinated diagnostic  agents, Iodine, Levofloxacin, Metrizamide, Nsaids, Nucynta [tapentadol hcl], Nucynta [tapentadol], Other, Oxycodone, Pentazocine, Septra [bactrim], Sulfasalazine, Adhesive [tape], Latex, Morphine, and Sulfa antibiotics   Social History   Socioeconomic History   Marital status: Divorced    Spouse name: none/divorced   Number of children: Not on file   Years of education: Not on file   Highest education level: 12th grade  Occupational History   Occupation: retired  Tobacco Use   Smoking status: Former    Packs/day: 0.10    Years: 14.00    Pack years: 1.40    Types: Cigarettes    Quit date: 1980    Years since quitting: 42.8   Smokeless tobacco: Never   Tobacco comments:    03/31/2018 "quit ~ 1980; someday smoker when I did smoke; never addicted"  Vaping Use   Vaping Use: Never used  Substance and Sexual Activity   Alcohol use: Never   Drug use: Yes    Types: Oxycodone, Benzodiazepines    Comment: 03/31/2018 "for chronic neck and back pain", takes Klonopin at times.    Sexual activity: Not Currently  Other Topics Concern   Not on file  Social History Narrative   Lives by herself.    Social Determinants of  Health   Financial Resource Strain: Low Risk    Difficulty of Paying Living Expenses: Not hard at all  Food Insecurity: No Food Insecurity   Worried About Charity fundraiser in the Last Year: Never true   [place] in the Last Year: Never true  Transportation Needs: No Transportation Needs   Lack of Transportation (Medical): No   Lack of Transportation (Non-Medical): No  Physical Activity: Insufficiently Active   Days of Exercise per Week: 3 days   Minutes of Exercise per Session: 20 min  Stress: No Stress Concern Present   Feeling of Stress : Only a little  Social Connections: Moderately Isolated   Frequency of Communication with Friends and Family: More than three times a week   Frequency of Social Gatherings with Friends and Family: Once a week   Attends Religious Services: 1 to 4 times per year   Active Member of [employer] or Organizations: No   Attends Music therapist: Never   Marital Status: Divorced     Family History: The patient's family history includes Arthritis in her mother; Diabetes in her brother, brother, and father; Heart Problems in her brother; Heart disease in her mother, sister, and sister; Hyperlipidemia in her sister and sister; Stroke in her sister; Uterine cancer in her mother.  ROS:   Please see the history of present illness.    No chest pain, no syncope.  All other systems reviewed and are negative.  EKGs/Labs/Other Studies Reviewed:    The following studies were reviewed today: 10/2020: ECHO    1. Left ventricular ejection fraction, by estimation, is 50 to 55%. The  left ventricle has low normal function. The left ventricle has no regional  wall motion abnormalities.   2. The right ventricular size is mildly enlarged.   3. The mitral valve has been repaired/replaced. Trivial mitral valve  regurgitation. There is a 32 mm sorin Memo 4D prosthetic annuloplasty ring  present in the mitral position. Mean gradient 47mmHg at HR 67bpm.  Procedure  Date: 01/17/20.   4. The aortic valve is tricuspid. There is mild calcification of the  aortic valve. There is mild thickening of the aortic valve. Aortic valve  regurgitation is mild. Mild aortic  valve sclerosis is present, with no  evidence of aortic valve stenosis.   5. The inferior vena cava is dilated in size with <50% respiratory  variability, suggesting right atrial pressure of 15 mmHg.   EKG:  EKG is  ordered today.  The ekg ordered today demonstrates AFIB 73 at beginning of visit, after auscultation, she sounds very regular and I repeated EKG and it showed sinus rhythm 65  Recent Labs: 09/21/2020: Magnesium 2.0 11/14/2020: NT-Pro BNP 790 12/10/2020: ALT 17; BUN 22; Creatinine, Ser 0.78; Hemoglobin 12.5; Platelets 191.0; Potassium 4.3; Sodium 142 04/17/2021: TSH 0.22  Recent Lipid Panel    Component Value Date/Time   CHOL 175 12/10/2020 1338   TRIG 79.0 12/10/2020 1338   HDL 80.40 12/10/2020 1338   CHOLHDL 2 12/10/2020 1338   VLDL 15.8 12/10/2020 1338   LDLCALC 79 12/10/2020 1338   LDLDIRECT 136.5 06/08/2013 1049     Risk Assessment/Calculations:          Physical Exam:    VS:  BP 120/70 (BP Location: Left Arm, Patient Position: Sitting, Cuff Size: Normal)   Pulse 73   Ht 5\' 6"  (1.676 m)   Wt 117 lb (53.1 kg)   BMI 18.88 kg/m     Wt Readings from Last 3 Encounters:  04/25/21 117 lb (53.1 kg)  04/17/21 118 lb 9.6 oz (53.8 kg)  03/28/21 113 lb (51.3 kg)     GEN: Thin in no acute distress HEENT: Normal NECK: No JVD; No carotid bruits LYMPHATICS: No lymphadenopathy CARDIAC: RRR, no murmurs, rubs, gallops RESPIRATORY:  Clear to auscultation without rales, wheezing or rhonchi  ABDOMEN: Soft, non-tender, non-distended MUSCULOSKELETAL:  No edema; No deformity  SKIN: Warm and dry NEUROLOGIC:  Alert and oriented x 3 PSYCHIATRIC:  Normal affect   ASSESSMENT:    1. Paroxysmal atrial fibrillation (HCC)   2. S/P Maze operation for atrial fibrillation    3. S/P mitral valve repair    PLAN:    In order of problems listed above:  Atrial fibrillation ([place]) After auscultation, she sounded like she was back in normal rhythm and this was confirmed by EKG.  Reassurance.  She definitely has atrial fibrillation that is paroxysmal.  She had maze procedure in April.  Continue with metoprolol as well as Xarelto.  [name] may wish to trial antiarrhythmic if necessary in the future.  I did state that sometimes less medicine is better.  S/P Maze operation for atrial fibrillation Stable. No change  S/P mitral valve repair ECHO noted. Stable.       Medication Adjustments/Labs and Tests Ordered: Current medicines are reviewed at length with the patient today.  Concerns regarding medicines are outlined above.  Orders Placed This Encounter  Procedures   EKG 12-Lead   No orders of the defined types were placed in this encounter.   Patient Instructions  Medication Instructions:  The current medical regimen is effective;  continue present plan and medications.  *If you need a refill on your cardiac medications before your next appointment, please call your pharmacy*  Follow-Up: At [facility], you and your health needs are our priority.  As part of our continuing mission to provide you with exceptional heart care, we have created designated Provider Care Teams.  These Care Teams include your primary Cardiologist (physician) and Advanced Practice Providers (APPs -  Physician Assistants and Nurse Practitioners) who all work together to provide you with the care you need, when you need it.  We recommend signing up for  the patient portal called "MyChart".  Sign up information is provided on this After Visit Summary.  MyChart is used to connect with patients for Virtual Visits (Telemedicine).  Patients are able to view lab/test results, encounter notes, upcoming appointments, etc.  Non-urgent messages can be sent to your provider as well.   To  learn more about what you can do with MyChart, go to [email or web address].    Your next appointment:   Follow up as previously instructed.  Thank you for choosing [facility]!!     Signed, Candee Furbish, MD  04/25/2021 5:07 PM    [place]

## 2021-04-25 NOTE — Telephone Encounter (Signed)
Pt is calling in to make an appt to see someone for complaints of increased sob upon waking this morning.    Pt states she has had ongoing sob for a year, but this morning when she woke up, she states it was more increased than usual.  She also states she has pain in her shoulder blades, which does not radiate through to her chest.  She states she has a lot of back issues and has had 7 back/neck surgeries in the past.   Pt also states she gets sob with exertion as well, like when she is pushing a cart at the grocery store.   Pt denies any chest pain, N/V, diaphoresis, dizziness, pre-syncopal or syncopal episodes.   Pts BP/HR this morning was 129/70 HR-70.  She is unsure if HR is regular or irregular.  Pt has a history of afib. Pt denies any lower extremity edema, and she states her weight is at her current baseline.   Pt very tearful on the phone saying "I just want to live life longer." Pt has known anxiety and several other non-cardiac related issues going on.  She reports she is depressed as well, and lives alone.  She states being she's alone, anytime something new arises with her heart, she gets extremely anxious.   Pt was able to speak to me on the phone with no difficulty breathing.  She was able to complete full sentences with no issues.   Pt has hx of mitral valve repair with last echo done in April 2022.  Her last echo showed mitral valve repair was functioning well.  Her EF was 50-55%. Pt has not followed up with [facility] in quite sometime.  Pt is followed by Dr. Johney Frame for Gen Cards and EP for afib.  Scheduled the pt to come into the office to see Dr. Marlou Porch for today at 4:30 pm.  She is aware to arrive 15 mins prior to this appt.  ED precautions provided to the pt if symptoms were to worsen in the meantime between now and her appt this afternoon.  Pt verbalized understanding and agrees with this plan.  Off note:  spent over 20 mins on the phone with the pt about complaints  mentioned.

## 2021-04-25 NOTE — Assessment & Plan Note (Signed)
ECHO noted. Stable.

## 2021-04-25 NOTE — Telephone Encounter (Signed)
Pt c/o Shortness Of Breath: STAT if SOB developed within the last 24 hours or pt is noticeably SOB on the phone  1. Are you currently SOB (can you hear that pt is SOB on the phone)? Yes, cannot hear over the phone   2. How long have you been experiencing SOB? Since she had her surgery over a year ago   3. Are you SOB when sitting or when up moving around? Both   4. Are you currently experiencing any other symptoms? Fatigue, Pain through her back,   States [place] advised her that her back pain is due to her back problems last time she was in the office and discussed it. She has had 7 back surgeries.

## 2021-04-29 ENCOUNTER — Encounter: Payer: Self-pay | Admitting: Physician Assistant

## 2021-04-29 ENCOUNTER — Telehealth: Payer: Self-pay | Admitting: Cardiology

## 2021-04-29 ENCOUNTER — Ambulatory Visit (INDEPENDENT_AMBULATORY_CARE_PROVIDER_SITE_OTHER): Payer: Medicare Other | Admitting: Physician Assistant

## 2021-04-29 ENCOUNTER — Other Ambulatory Visit: Payer: Self-pay

## 2021-04-29 DIAGNOSIS — Z5189 Encounter for other specified aftercare: Secondary | ICD-10-CM | POA: Insufficient documentation

## 2021-04-29 NOTE — Telephone Encounter (Signed)
Spent 30 min on phone with patient. She reports that she went back into AFib on Friday of last week. HRs seem to be controlled in 70-80s. Pt experiencing the same issues as usual with her afib -- pounding, skipped beat, feeling jittery, sob. Pt aware that she will most likely need a monitor to determine afib burden.  Pt very adamant she does not want to do that at this time and would prefer to wait. She is currently working with [facility] about a "stitch that still in there". She would like to have this figured out before she puts a monitor on her skin. She will call me next week and let me know how she is doing and we will go from there on care plan. Patient verbalized understanding and agreeable to plan.

## 2021-04-29 NOTE — Progress Notes (Signed)
[address]       [place] [address]             [phone]        [name] is a 75 y.o. female patient s/p mitral valve repair on 01/17/2020. She has been back to the office a few times for a stitch at the top of her sternotomy incision that has been bothering her.   Today, she presents for a wound check.  She states that she has had increased redness and a scab formation over the top portion of her sternotomy incision.  This does seem like a stitch abscess.  She also is concerned about an area further down which is a hard spot underneath the skin.  She says it causes her great discomfort.  Of note her sternum was closed with Fibertape cerclage by Dr. Roxy Manns.  1. 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   "right"   Mitral regurgitation    Osteopenia 02/18/2012   Osteoporosis    PAF (paroxysmal atrial fibrillation) ([place]) 2012   Peripheral neuropathy 11/06/2011   PMDD (premenstrual dysphoric disorder)    PONV (postoperative nausea and vomiting)    nausea, vomiting, hives and dizziness    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/2020   Complete bilateral atrial lesion set using cryothermy and bipolar radiofrequency ablation with clipping of LA appendage via right mini-thoracotomy approach   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 Scheduled Meds: Current Outpatient Medications on File Prior to Visit  Medication Sig Dispense Refill   acetaminophen (TYLENOL) 500 MG tablet Take 500 mg by mouth every 6 (six) hours as needed for moderate pain.      b complex vitamins tablet Take 1 tablet by mouth daily.     busPIRone (BUSPAR) 5 MG tablet buspirone 5 mg tablet     Calcium Carbonate Antacid (TUMS CHEWY BITES PO) Take 1 tablet by mouth daily as needed (reflux).      Calcium Citrate-Vitamin D (CALCIUM + D PO) Take 1 tablet by mouth daily.     Carboxymethylcellul-Glycerin (LUBRICATING EYE DROPS OP) Place 1 drop into both eyes daily as needed (dry eyes).     clonazePAM (KLONOPIN) 0.5 MG tablet Take 0.5 mg by mouth as needed.     cyclobenzaprine (FLEXERIL) 10 MG tablet Take 10 mg by mouth at bedtime as needed for muscle spasms.   5   denosumab (PROLIA) 60 MG/ML SOSY injection Inject 60 mg into the skin every 6 (six) months.     EUTHYROX 75 MCG tablet Take  1 tablet by mouth once daily 90 tablet 0   furosemide (LASIX) 20 MG tablet Take 1 tablet (20 mg total) by mouth daily. 90 tablet 3   meclizine (ANTIVERT) 25 MG tablet TAKE 1 TABLET BY MOUTH THREE TIMES DAILY AS NEEDED FOR DIZZINESS 30 tablet 0   metoprolol tartrate (LOPRESSOR) 50 MG tablet Take 1 tablet (50 mg total) by mouth 2 (two) times daily. 180 tablet 3   Nutritional Supplements (,FEEDING SUPPLEMENT, PROSOURCE PLUS) liquid Take 30 mLs by mouth 2 (two) times daily between meals. 887 mL 0   nystatin (MYCOSTATIN/NYSTOP) powder Apply topically 2 (two) times daily as needed (skin irritation (under breasts)). 15 g 0   ondansetron (ZOFRAN) 4 MG tablet Take 1 tablet (4 mg total) by mouth every 8 (eight) hours as needed. 30 tablet 0   oxyCODONE (ROXICODONE) 5 MG/5ML solution Take 4 mLs (4 mg total) by mouth every 4 (four) hours  as needed for moderate pain.  0   polyethylene glycol (MIRALAX / GLYCOLAX) packet Take 17 g by mouth daily as needed for mild constipation.     rivaroxaban (XARELTO) 20 MG TABS tablet Take 1 tablet (20 mg total) by mouth daily with supper. 30 tablet 6   sodium chloride (OCEAN) 0.65 % SOLN nasal spray Place 1 spray into both nostrils as needed for congestion (nose bleeds).     triamcinolone ointment (KENALOG) 0.1 % Apply 1 application topically 2 (two) times daily. 90 g 1   valACYclovir (VALTREX) 500 MG tablet Take 500 mg by mouth daily as needed (breakouts).      No current facility-administered medications on file prior to visit.     Allergies  Allergen Reactions   Buprenorphine Nausea Only and Other (See Comments)    sedation and adhesive reaction Other reaction(s): nausea, sedation and adhesive reaction   Cymbalta [Duloxetine Hcl]     Severe diarrhea and upset stomach   Erythromycin Nausea And Vomiting    Dizziness    Hydrocodone Other (See Comments)    Sedation,dizziness, and nausea Other reaction(s): sedation and nausea Other reaction(s): sedation and nausea   Hydromorphone Other (See Comments)    Sedation,dizziness, and nausea Other reaction(s): sedation and nausea Other reaction(s): sedation and nausea   Iodinated Diagnostic Agents Hives and Nausea Only   Iodine     unknown   Levofloxacin Nausea And Vomiting   Metrizamide Hives   Nsaids     CANNOT TAKE PER CARDIOLOGIST DUE TO AFIB    Nucynta [Tapentadol Hcl]     nausea and sedation   Nucynta [Tapentadol] Nausea Only   Other Other (See Comments)    CAN NOT TAKE , aLPRAZOLAM, OR ELAVIL DUE TO AFIB FOR ANXIETY  IV CONTRAST /"DYE". Other reaction(s): hives and sores   Oxycodone Other (See Comments)    Delusions (intolerance)  PILLS ONLY sedation, dizziness, nausea,  and itching Other reaction(s): sedation and nausea Other reaction(s): sedation and nausea   Pentazocine Other (See Comments)    Unknown Other  reaction(s): Unknown Other reaction(s): Unknown   Septra [Bactrim]     Hives    Sulfasalazine Hives   Adhesive [Tape] Rash and Other (See Comments)    Heart monitor stickers must be rotated in order to prevent rash   Latex Palpitations and Other (See Comments)     hives and sores Other reaction(s): hives and sores   Morphine Anxiety and Other (See Comments)    sedation and nausea Other reaction(s): sedation and nausea Other reaction(s): sedation and nausea  Sulfa Antibiotics Hives, Nausea Only and Rash    rash   Active Problems:   * No active hospital problems. * Vitals:   04/29/21 1333  BP: (!) 102/52  Pulse: 69  Resp: 20  SpO2: 92%     Wound: There is a area on the top of her sternotomy incision that does have some erythema with a scab present.  I peeled back the scab and tried to express any drainage.  There was no drainage present.  I cleaned the area well with Betadine.  She does have another area of concern further down her sternotomy incision which seems to be a hard knot poking out under the skin.  My best guess is that this is a knot from her Fibertape.   Assessment & Plan  We discussed her wound and her continued issues with a stitch.  I tried to remove as much of the scab as I could and clean the area well.  She did not appear to have any drainage this time or any think to cut sticking out.  She is worried about a an area on her sternotomy incision further down which appears to be a hard spot underneath the skin.  This might need to be explored at the bedside.  It does cause her significant amount of discomfort.  We discussed her chronic pain issues and how it is limiting her activity level.  She sees a chronic pain doctor but is going to be referred to a physician who is more of a nerve pain specialist.  She has had new onset nerve pain in her face and jaw which she does not currently take any medication for because she has an allergy to gabapentin.  When she took  gabapentin she felt "drunk".  She takes chronic narcotics for her pain currently.  I recommend washing the incision with soap and water.  I have brought her back to see Dr. Kipp Brood on Friday to discuss further options with her sternotomy incision.  I have asked her to get a chest x-ray before this appointment even though she is aware that the Fiibertape is not radiopaque.  She preferred to get a chest x-ray just to make sure everything was normal.  Elgie Collard 04/29/2021

## 2021-04-29 NOTE — Telephone Encounter (Signed)
Patient was in this past Friday saw APP. She is called stating the APP talked about going into hospital to have heart shocked he was going to talk to [name] about it.  She said she is in A-Fib today and was all weekend.  She is willing to do whatever needs to be done to have her A-Fib under control. It really has her stressed and concerned.

## 2021-05-01 ENCOUNTER — Other Ambulatory Visit: Payer: Self-pay | Admitting: Family Medicine

## 2021-05-01 ENCOUNTER — Other Ambulatory Visit: Payer: Self-pay | Admitting: Thoracic Surgery (Cardiothoracic Vascular Surgery)

## 2021-05-01 DIAGNOSIS — Z5189 Encounter for other specified aftercare: Secondary | ICD-10-CM

## 2021-05-01 DIAGNOSIS — E785 Hyperlipidemia, unspecified: Secondary | ICD-10-CM

## 2021-05-02 ENCOUNTER — Telehealth: Payer: Self-pay | Admitting: Cardiology

## 2021-05-02 DIAGNOSIS — Z20822 Contact with and (suspected) exposure to covid-19: Secondary | ICD-10-CM | POA: Diagnosis not present

## 2021-05-02 NOTE — Telephone Encounter (Signed)
Pt c/o BP issue: STAT if pt c/o blurred vision, one-sided weakness or slurred speech  1. What are your last 5 BP readings? 184/104; 76; 177/93; 71; 166/78; 65; 154/83; 79; 175/89; 65  2. Are you having any other symptoms (ex. Dizziness, headache, blurred vision, passed out)? SOB, not feeling well  3. What is your BP issue? High blood pressure; took bp medication at 7 am; normally take it around 8:30. Says that it frightened her with it being that high

## 2021-05-02 NOTE — Telephone Encounter (Signed)
Pt is calling in as she said she was instructed to by [employer], to update her on her current BP/HR readings.   Pt states she is concerned with her elevated readings over night, and is asking if her metoprolol tartrate needs to be adjusted or switched to an alternative regimen.   Pt states around 1 am last night she checked her BP/HR and noted her BP was elevated.  She states she was asymptomatic and only checked it to see what her numbers were.   She states her numbers at that time were 184/100 HR-76.  She then waited an hour or so and took it again and it was 177/93 HR-71.  Pt rechecked again around 6 am this morning it was 166/78 HR-65, which prompted her to take her first dose of metoprolol.  She states she took her metoprolol then waited an hour and rechecked again around 0700, and her numbers came down to 149/70 HR-59.    She voiced no cardiac complaints at this time, but is very anxious about her numbers.  Pt is asking that I communicate these readings and her concerns to [facility] RN and [name], for they wanted her to keep them posted and she would like further advisement on this matter.  Pt states her BP regimen may need to be adjusted or changed, based on overnight elevated readings.   Advised the pt to try and relax, avoid salty foods, continue her current regimen, and monitor her readings one hour after med administration, or if symptoms occur.  Advised the pt of signs and symptoms to look out for that would be associated with hypertensive numbers.  ED precautions also provided to the pt if symptoms were to occur.  Informed the pt that I will route this communication to [employer] and [name] to further review and advise on.  Informed the pt Sherri will touch base with her thereafter.   Will also include our PharmD team in on this, for they may have suggestions for med management for her elevated readings.   Pt verbalized understanding and agrees with this plan.

## 2021-05-03 ENCOUNTER — Ambulatory Visit
Admission: RE | Admit: 2021-05-03 | Discharge: 2021-05-03 | Disposition: A | Discharge status: Home / Self Care | Payer: Medicare Other | Source: Ambulatory Visit | Attending: Thoracic Surgery (Cardiothoracic Vascular Surgery) | Admitting: Thoracic Surgery (Cardiothoracic Vascular Surgery)

## 2021-05-03 ENCOUNTER — Other Ambulatory Visit: Payer: Self-pay

## 2021-05-03 ENCOUNTER — Ambulatory Visit (INDEPENDENT_AMBULATORY_CARE_PROVIDER_SITE_OTHER): Payer: Medicare Other | Admitting: Thoracic Surgery (Cardiothoracic Vascular Surgery)

## 2021-05-03 VITALS — BP 153/80 | HR 72 | Resp 20 | Ht 66.0 in | Wt 119.0 lb

## 2021-05-03 DIAGNOSIS — Z9889 Other specified postprocedural states: Secondary | ICD-10-CM

## 2021-05-03 DIAGNOSIS — Z5189 Encounter for other specified aftercare: Secondary | ICD-10-CM | POA: Diagnosis not present

## 2021-05-03 DIAGNOSIS — Z981 Arthrodesis status: Secondary | ICD-10-CM | POA: Diagnosis not present

## 2021-05-03 DIAGNOSIS — S2241XA Multiple fractures of ribs, right side, initial encounter for closed fracture: Secondary | ICD-10-CM | POA: Diagnosis not present

## 2021-05-03 DIAGNOSIS — J929 Pleural plaque without asbestos: Secondary | ICD-10-CM | POA: Diagnosis not present

## 2021-05-03 DIAGNOSIS — R918 Other nonspecific abnormal finding of lung field: Secondary | ICD-10-CM | POA: Diagnosis not present

## 2021-05-03 NOTE — Telephone Encounter (Signed)
Followed up with pt. Pt states that she never told nurse nor did she every take her BP at 1am, says that she did take up until she went to bed. Pt is currently upstairs getting ready for [facility] appt.   He reading are downstairs. Pt advised to keep getting ready for her appt and I will call her back later today to further discuss this matter. Patient agreeable to plan.

## 2021-05-03 NOTE — Telephone Encounter (Signed)
Followed up with pt. Discussed her BP concerns...Marland KitchenMarland Kitchen Pt reports monitoring as requested. She says these are the correct readings/times (and that she did not take any readings at 1am):  Wednesday  11pm 177/93, HR 71     Thursday  5am -- 184/100,                6am -- 193/89, HR 65 Today:   10am -- 161/82, HR 70s (after medication)   At [facility] -- 150/80, HR 71  (she also had them check her home BP cuff for accuracy)  She does report HA/blurred vision with high readings Pt aware that she should reach out to PCP for advisement on BP. Aware that most likely secondary  to anxiety, but needs addressing. She does mention that [name] is trying to find another medication to help with anxiety. Pt advised to call office over the weekend/after hours if needed if BP goes over 150/160s and/or symptomatic. Pt will call PCP Monday otherwise. Aware will have Dr. Jacolyn Reedy nurse also follow up with her next week to ensure someone is addressing hypertension. Patient verbalized understanding and agreeable to plan.

## 2021-05-03 NOTE — Progress Notes (Signed)
      [address]       [place] [address]             [phone]        Laural J Nier [place] Medical Record #[id number] Date of Birth: [birth year]  Referring: Josue Hector, MD Primary Care: [name], MD Primary Cardiologist:Mark Marlou Porch, MD  Reason for visit:   follow-up  History of Present Illness:     Is a 75 year old female who underwent a mini mitral valve repair with conversion to a sternotomy in July 2021 who comes in for wound check.  She is quite thin and has noticed the sternal closure laces poking through the skin.  They have been cut on to previous locations.  She continues to have some irritation at the uppermost site.  Physical Exam: BP (!) 153/80   Pulse 72   Resp 20   Ht 5\' 6"  (1.676 m)   Wt 119 lb (54 kg)   SpO2 92% Comment: RA  BMI 19.21 kg/m   Alert NAD Small punctate area of irritation.  She is quite thin and has no fat over her sternum.  There are multiple nodules that correspond with the knots from the sternal closure la      Assessment / Plan:   75 year old female that underwent sternal closure with fiber tape cerclage.  The uppermost cerclage has been sticking through the skin.  There is nothing evident today above the skin that requires debridement.  She would like to revisit the idea of removal of the cerclage the first of the year.  She will come back in in our PA clinic for further discussion.   Lajuana Matte 05/03/2021 6:20 PM

## 2021-05-06 ENCOUNTER — Other Ambulatory Visit: Payer: Self-pay

## 2021-05-06 ENCOUNTER — Encounter: Payer: Self-pay | Admitting: Registered Nurse

## 2021-05-06 ENCOUNTER — Ambulatory Visit (INDEPENDENT_AMBULATORY_CARE_PROVIDER_SITE_OTHER): Payer: Medicare Other | Admitting: Registered Nurse

## 2021-05-06 ENCOUNTER — Telehealth: Payer: Self-pay

## 2021-05-06 VITALS — BP 148/88 | HR 70 | Temp 98.2°F | Resp 18 | Ht 66.0 in | Wt 120.8 lb

## 2021-05-06 DIAGNOSIS — M150 Primary generalized (osteo)arthritis: Secondary | ICD-10-CM | POA: Diagnosis not present

## 2021-05-06 DIAGNOSIS — Z79891 Long term (current) use of opiate analgesic: Secondary | ICD-10-CM | POA: Diagnosis not present

## 2021-05-06 DIAGNOSIS — I10 Essential (primary) hypertension: Secondary | ICD-10-CM

## 2021-05-06 DIAGNOSIS — G894 Chronic pain syndrome: Secondary | ICD-10-CM | POA: Diagnosis not present

## 2021-05-06 DIAGNOSIS — M961 Postlaminectomy syndrome, not elsewhere classified: Secondary | ICD-10-CM | POA: Diagnosis not present

## 2021-05-06 DIAGNOSIS — M41125 Adolescent idiopathic scoliosis, thoracolumbar region: Secondary | ICD-10-CM | POA: Diagnosis not present

## 2021-05-06 MED ORDER — AMLODIPINE BESYLATE 5 MG PO TABS: 5.0000 mg | ORAL_TABLET | Freq: Every day | ORAL | 0 refills | Status: DC

## 2021-05-06 NOTE — Progress Notes (Signed)
Established Patient Office Visit  Subjective:  Patient ID: [name], female    DOB: 09/16/1945  Age: 75 y.o. MRN: [id number]  CC:  Chief Complaint  Patient presents with   Hypertension    Patient states she has been occasionally checking her blood pressure and has been getting 184/104 ,193/89 , 143/80 , 149/70.    HPI [name] presents for elevated BP  Hx of mitral valve regurgitation with surgical repair On metoprolol daily.  This weekend - had 180-190/100s. Called after hours nurse, who deferred her to visit today with ER precautions.  This morning 160s/90s.  Notes allergies affecting her vision, otherwise no visual changes, chest pain, headache, claudication, or dependent edema.  She does note that these numbers were taken after taking her metoprolol. She does note that these numbers improve with clonazepam use.    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   "right"   Mitral regurgitation    Osteopenia 02/18/2012   Osteoporosis    PAF (paroxysmal atrial fibrillation) ([place]) 2012   Peripheral neuropathy 11/06/2011   PMDD (premenstrual dysphoric disorder)    PONV (postoperative nausea and vomiting)    nausea, vomiting, hives and dizziness    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/2020   Complete bilateral atrial lesion set using cryothermy and bipolar radiofrequency ablation with clipping of LA appendage via right mini-thoracotomy approach   S/P  mitral valve repair 01/17/2020   Complex valvuloplasty including artificial Gore-tex neochord placement x12 with 68m Sorin Memo 4D ring annuloplasty   Seasonal allergies    Vaginal delivery    ONE NSVD   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    Past Surgical History:  Procedure Laterality Date   ANTERIOR CERVICAL DECOMP/DISCECTOMY FUSION  ~ 2003   BACK SURGERY     BREAST SURGERY     BREAST BIOPSY--RIGHT BENIGN   BUNIONECTOMY Bilateral    CARDIOVERSION N/A 01/26/2020   Procedure: CARDIOVERSION;  Surgeon: OGeralynn Rile MD;  [facility]  Service: Cardiovascular;  Laterality: N/A;   COSMETIC SURGERY  2016   "back of my neck; related to earlier fusion"   [place]    "removed bone protruding out of my forehead"   HARDWARE REMOVAL  2016   "related to neck OR"   INCONTINENCE SURGERY     IR THORACENTESIS ASP PLEURAL SPACE W/IMG GUIDE  02/16/2020   MINIMALLY INVASIVE MAZE PROCEDURE N/A 01/17/2020   Procedure: MINIMALLY INVASIVE MAZE PROCEDURE;  Surgeon: ORexene Alberts MD;  [facility]  Service: Open Heart Surgery;  Laterality: N/A;   MITRAL VALVE REPAIR Right 01/17/2020   Procedure: MINIMALLY INVASIVE MITRAL VALVE REPAIR (MVR) USING MEMO 4D 32MM;  Surgeon: ORexene Alberts MD;  [place] [facility]  Service: Open Heart Surgery;  Laterality: Right;   POSTERIOR CERVICAL FUSION/FORAMINOTOMY  ~ 2008; 2015   RIGHT/LEFT HEART CATH AND CORONARY ANGIOGRAPHY N/A 12/02/2019   Procedure: RIGHT/LEFT HEART CATH AND CORONARY ANGIOGRAPHY;  Surgeon: Burnell Blanks, MD;  Location: [facility];  Service: Cardiovascular;  Laterality: N/A;   SHOULDER ARTHROSCOPY W/ ROTATOR CUFF REPAIR Right 2012   SPINAL FUSION  06/2014 - 2018 X ?7   "scoliosis; my entire back"   TEE WITHOUT CARDIOVERSION N/A 11/21/2019   Procedure: TRANSESOPHAGEAL ECHOCARDIOGRAM (TEE);  Surgeon: Josue Hector,  MD;  [facility]: [facility] END[facility];  Service: Cardiovascular;  Laterality: N/A;   TEE WITHOUT CARDIOVERSION N/A 01/17/2020   Procedure: TRANSESOPHAGEAL ECHOCARDIOGRAM (TEE);  Surgeon: Rexene Alberts, MD;  Location: [place];  Service: Open Heart Surgery;  Laterality: N/A;   TEE WITHOUT CARDIOVERSION N/A 01/26/2020   Procedure: TRANSESOPHAGEAL ECHOCARDIOGRAM (TEE);  Surgeon: Geralynn Rile, MD;  Location: [place];  Service: Cardiovascular;  Laterality: N/A;   TUBAL LIGATION     VAGINAL HYSTERECTOMY     TVH    Family History  Problem Relation Age of Onset   Diabetes Father    Hyperlipidemia Sister    Heart disease Sister    Stroke Sister    Diabetes Brother    Hyperlipidemia Sister    Heart disease Sister    Arthritis Mother    Heart disease Mother    Uterine cancer Mother    Diabetes Brother    Heart Problems Brother     Social History   Socioeconomic History   Marital status: Divorced    Spouse name: none/divorced   Number of children: Not on file   Years of education: Not on file   Highest education level: 12th grade  Occupational History   Occupation: retired  Tobacco Use   Smoking status: Former    Packs/day: 0.10    Years: 14.00    Pack years: 1.40    Types: Cigarettes    Quit date: 1980    Years since quitting: 42.8   Smokeless tobacco: Never   Tobacco comments:    03/31/2018 "quit ~ 1980; someday smoker when I did smoke; never addicted"  Vaping Use   Vaping Use: Never used  Substance and Sexual Activity   Alcohol use: Never   Drug use: Yes    Types: Oxycodone, Benzodiazepines    Comment: 03/31/2018 "for chronic neck and back pain", takes Klonopin at times.    Sexual activity: Not Currently  Other Topics Concern   Not on file  Social History Narrative   Lives by herself.    Social Determinants of Health   Financial Resource Strain: Low Risk    Difficulty of Paying Living Expenses: Not hard at all  Food Insecurity: No Food Insecurity   Worried  About Charity fundraiser in the Last Year: Never true   [place] in the Last Year: Never true  Transportation Needs: No Transportation Needs   Lack of Transportation (Medical): No   Lack of Transportation (Non-Medical): No  Physical Activity: Insufficiently Active   Days of Exercise per Week: 3 days   Minutes of Exercise per Session: 20 min  Stress: No Stress Concern Present   Feeling of Stress : Only a little  Social Connections: Moderately Isolated   Frequency of Communication with Friends and Family: More than three times a week   Frequency of Social Gatherings with Friends and Family: Once a week   Attends Religious Services: 1 to 4 times  per year   Active Member of Clubs or Organizations: No   Attends Archivist Meetings: Never   Marital Status: Divorced  Human resources officer Violence: Not At Risk   Fear of Current or Ex-Partner: No   Emotionally Abused: No   Physically Abused: No   Sexually Abused: No    Outpatient Medications Prior to Visit  Medication Sig Dispense Refill   acetaminophen (TYLENOL) 500 MG tablet Take 500 mg by mouth every 6 (six) hours as needed for moderate pain.      b complex vitamins tablet Take 1 tablet by mouth daily.     Calcium Carbonate Antacid (TUMS CHEWY BITES PO) Take 1 tablet by mouth daily as needed (reflux).      Calcium Citrate-Vitamin D (CALCIUM + D PO) Take 1 tablet by mouth daily.     Carboxymethylcellul-Glycerin (LUBRICATING EYE DROPS OP) Place 1 drop into both eyes daily as needed (dry eyes).     clonazePAM (KLONOPIN) 0.5 MG tablet Take 0.5 mg by mouth as needed.     cyclobenzaprine (FLEXERIL) 10 MG tablet Take 10 mg by mouth at bedtime as needed for muscle spasms.   5   denosumab (PROLIA) 60 MG/ML SOSY injection Inject 60 mg into the skin every 6 (six) months.     furosemide (LASIX) 20 MG tablet Take 1 tablet (20 mg total) by mouth daily. 90 tablet 3   levothyroxine (SYNTHROID) 75 MCG tablet Take 1 tablet by mouth once  daily 90 tablet 0   meclizine (ANTIVERT) 25 MG tablet TAKE 1 TABLET BY MOUTH THREE TIMES DAILY AS NEEDED FOR DIZZINESS 30 tablet 0   metoprolol tartrate (LOPRESSOR) 50 MG tablet Take 1 tablet (50 mg total) by mouth 2 (two) times daily. 180 tablet 3   Nutritional Supplements (,FEEDING SUPPLEMENT, PROSOURCE PLUS) liquid Take 30 mLs by mouth 2 (two) times daily between meals. 887 mL 0   nystatin (MYCOSTATIN/NYSTOP) powder Apply topically 2 (two) times daily as needed (skin irritation (under breasts)). 15 g 0   ondansetron (ZOFRAN) 4 MG tablet Take 1 tablet (4 mg total) by mouth every 8 (eight) hours as needed. 30 tablet 0   oxyCODONE (ROXICODONE) 5 MG/5ML solution Take 4 mLs (4 mg total) by mouth every 4 (four) hours as needed for moderate pain.  0   polyethylene glycol (MIRALAX / GLYCOLAX) packet Take 17 g by mouth daily as needed for mild constipation.     rivaroxaban (XARELTO) 20 MG TABS tablet Take 1 tablet (20 mg total) by mouth daily with supper. 30 tablet 6   sodium chloride (OCEAN) 0.65 % SOLN nasal spray Place 1 spray into both nostrils as needed for congestion (nose bleeds).     triamcinolone ointment (KENALOG) 0.1 % Apply 1 application topically 2 (two) times daily. 90 g 1   valACYclovir (VALTREX) 500 MG tablet Take 500 mg by mouth daily as needed (breakouts).      No facility-administered medications prior to visit.    Allergies  Allergen Reactions   Buprenorphine Nausea Only and Other (See Comments)    sedation and adhesive reaction Other reaction(s): nausea, sedation and adhesive reaction   Cymbalta [Duloxetine Hcl]     Severe diarrhea and upset stomach   Erythromycin Nausea And Vomiting    Dizziness    Hydrocodone Other (See Comments)    Sedation,dizziness, and nausea Other reaction(s): sedation and nausea Other reaction(s): sedation and nausea   Hydromorphone Other (See Comments)    Sedation,dizziness, and nausea Other reaction(s): sedation  and nausea Other reaction(s):  sedation and nausea   Iodinated Diagnostic Agents Hives and Nausea Only   Iodine     unknown   Levofloxacin Nausea And Vomiting   Metrizamide Hives   Nsaids     CANNOT TAKE PER CARDIOLOGIST DUE TO AFIB    Nucynta [Tapentadol Hcl]     nausea and sedation   Nucynta [Tapentadol] Nausea Only   Other Other (See Comments)    CAN NOT TAKE , aLPRAZOLAM, OR ELAVIL DUE TO AFIB FOR ANXIETY  IV CONTRAST /"DYE". Other reaction(s): hives and sores   Oxycodone Other (See Comments)    Delusions (intolerance)  PILLS ONLY sedation, dizziness, nausea,  and itching Other reaction(s): sedation and nausea Other reaction(s): sedation and nausea   Pentazocine Other (See Comments)    Unknown Other reaction(s): Unknown Other reaction(s): Unknown   Septra [Bactrim]     Hives    Sulfasalazine Hives   Adhesive [Tape] Rash and Other (See Comments)    Heart monitor stickers must be rotated in order to prevent rash   Latex Palpitations and Other (See Comments)     hives and sores Other reaction(s): hives and sores   Morphine Anxiety and Other (See Comments)    sedation and nausea Other reaction(s): sedation and nausea Other reaction(s): sedation and nausea   Sulfa Antibiotics Hives, Nausea Only and Rash    rash    ROS Review of Systems  Constitutional: Negative.   HENT: Negative.    Eyes: Negative.   Respiratory: Negative.    Cardiovascular: Negative.   Gastrointestinal: Negative.   Genitourinary: Negative.   Musculoskeletal: Negative.   Skin: Negative.   Neurological: Negative.   Psychiatric/Behavioral: Negative.    All other systems reviewed and are negative.    Objective:    Physical Exam Vitals and nursing note reviewed.  Constitutional:      General: She is not in acute distress.    Appearance: Normal appearance. She is normal weight. She is not ill-appearing, toxic-appearing or diaphoretic.  Cardiovascular:     Rate and Rhythm: Normal rate and regular rhythm.     Heart sounds:  Normal heart sounds. No murmur heard.   No friction rub. No gallop.  Pulmonary:     Effort: Pulmonary effort is normal. No respiratory distress.     Breath sounds: Normal breath sounds. No stridor. No wheezing, rhonchi or rales.  Chest:     Chest wall: No tenderness.  Skin:    General: Skin is warm and dry.  Neurological:     General: No focal deficit present.     Mental Status: She is alert and oriented to person, place, and time. Mental status is at baseline.  Psychiatric:        Mood and Affect: Mood normal.        Behavior: Behavior normal.        Thought Content: Thought content normal.        Judgment: Judgment normal.    BP (!) 148/88   Pulse 70   Temp 98.2 F (36.8 C) (Temporal)   Resp 18   Ht '5\' 6"'  (1.676 m)   Wt 120 lb 12.8 oz (54.8 kg)   SpO2 99%   BMI 19.50 kg/m  Wt Readings from Last 3 Encounters:  05/06/21 120 lb 12.8 oz (54.8 kg)  05/03/21 119 lb (54 kg)  04/29/21 118 lb (53.5 kg)     Health Maintenance Due  Topic Date Due   COVID-19 Vaccine (3 - [employer]  risk series) 09/29/2019    There are no preventive care reminders to display for this patient.  Lab Results  Component Value Date   TSH 0.22 (L) 04/17/2021   Lab Results  Component Value Date   WBC 5.8 12/10/2020   HGB 12.5 12/10/2020   HCT 38.2 12/10/2020   MCV 87.3 12/10/2020   PLT 191.0 12/10/2020   Lab Results  Component Value Date   NA 142 12/10/2020   K 4.3 12/10/2020   CO2 30 12/10/2020   GLUCOSE 86 12/10/2020   BUN 22 12/10/2020   CREATININE 0.78 12/10/2020   BILITOT 0.4 12/10/2020   ALKPHOS 58 12/10/2020   AST 22 12/10/2020   ALT 17 12/10/2020   PROT 6.9 12/10/2020   ALBUMIN 4.2 12/10/2020   CALCIUM 9.1 12/10/2020   ANIONGAP 9 02/06/2020   EGFR 84 11/14/2020   GFR 73.81 12/10/2020   Lab Results  Component Value Date   CHOL 175 12/10/2020   Lab Results  Component Value Date   HDL 80.40 12/10/2020   Lab Results  Component Value Date   LDLCALC 79 12/10/2020    Lab Results  Component Value Date   TRIG 79.0 12/10/2020   Lab Results  Component Value Date   CHOLHDL 2 12/10/2020   Lab Results  Component Value Date   HGBA1C 5.6 04/16/2020      Assessment & Plan:   Problem List Items Addressed This Visit   None Visit Diagnoses     Primary hypertension    -  Primary   Relevant Medications   amLODipine (NORVASC) 5 MG tablet       Meds ordered this encounter  Medications   amLODipine (NORVASC) 5 MG tablet    Sig: Take 1 tablet (5 mg total) by mouth daily.    Dispense:  90 tablet    Refill:  0    Order Specific Question:   Supervising Provider    Answer:   Carlota Raspberry, JEFFREY R [[id number]]    Follow-up: Return in about 3 weeks (around 05/27/2021) for BP check - NURSE VISIT.   PLAN Start low dose amlodipine 51m po qd. Reviewed risks, benefits, and AE to this medication Continue checking BP at home Nurse visit BP check in 2-3 weeks Patient encouraged to call clinic with any questions, comments, or concerns.  RMaximiano Coss NP

## 2021-05-06 NOTE — Patient Instructions (Addendum)
Ms. Jarica Plass to meet you  Start amlodipine 5mg  daily. You can take this with your other morning meds (after synthroid). Keep checking your blood pressure 1-2 times daily. More often with symptoms.  See you in 2-3 weeks for a Nurse Visit blood pressure check. Bring your blood pressure cuff with you to this visit.  Thank you!  Rich   If you have lab work done today you will be contacted with your lab results within the next 2 weeks.  If you have not heard from [place] then please contact us. The fastest way to get your results is to register for My Chart.   IF you received an x-ray today, you will receive an invoice from [facility]. Please contact [facility] at [phone] with questions or concerns regarding your invoice.   IF you received labwork today, you will receive an invoice from [facility]. Please contact [facility] at [phone] with questions or concerns regarding your invoice.   Our billing staff will not be able to assist you with questions regarding bills from these companies.  You will be contacted with the lab results as soon as they are available. The fastest way to get your results is to activate your My Chart account. Instructions are located on the last page of this paperwork. If you have not heard from [place] regarding the results in 2 weeks, please contact this office.

## 2021-05-06 NOTE — Telephone Encounter (Signed)
BP historically very well controlled in office aside from elevated reading at [facility] of 153/80 on 10/28. She doesn't take anything for her BP, is on metoprolol for afib. Could start low dose amlodipine if BP readings remain elevated.

## 2021-05-07 NOTE — Telephone Encounter (Signed)
error 

## 2021-05-07 NOTE — Telephone Encounter (Signed)
Pt was seen by PCP yesterday for elevated BP readings.  Pt was started on amlodipine 5 mg po daily with a 2-3 week nurse visit scheduled back with them, at that visit.  Pts nurse visit for BP check is scheduled with PCP for 11/21.  See PCP note for further details.

## 2021-05-09 ENCOUNTER — Other Ambulatory Visit: Payer: Self-pay

## 2021-05-09 ENCOUNTER — Encounter: Payer: Self-pay | Admitting: Family Medicine

## 2021-05-09 ENCOUNTER — Ambulatory Visit (INDEPENDENT_AMBULATORY_CARE_PROVIDER_SITE_OTHER): Payer: Medicare Other | Admitting: Family Medicine

## 2021-05-09 VITALS — BP 118/66 | HR 78 | Temp 97.8°F | Resp 17 | Ht 66.0 in | Wt 118.6 lb

## 2021-05-09 DIAGNOSIS — F32A Depression, unspecified: Secondary | ICD-10-CM

## 2021-05-09 DIAGNOSIS — I10 Essential (primary) hypertension: Secondary | ICD-10-CM

## 2021-05-09 DIAGNOSIS — F419 Anxiety disorder, unspecified: Secondary | ICD-10-CM | POA: Diagnosis not present

## 2021-05-09 NOTE — Patient Instructions (Signed)
Based on your blood pressures in the office today and lower readings during the day had would not recommend increasing or adding any medication at this time.  There has been discussion previously on changing your metoprolol to the once a day medication to take at night.  That is an option but if you would like to discuss it first with your cardiologist, I think that is reasonable.  See information below on how to check your blood pressure.  If any new symptoms, be seen here, urgent care or emergency room as we discussed.  I do recommend follow-up with your primary care provider to discuss some of the concerns at home and possible stress/anxiety component.  Please let us know if we can help further and take care..   Return to the clinic or go to the nearest emergency room if any of your symptoms worsen or new symptoms occur.  How to Take Your Blood Pressure Blood pressure is a measurement of how strongly your blood is pressing against the walls of your arteries. Arteries are blood vessels that carry blood from your heart throughout your body. Your health care provider takes your blood pressure at each office visit. You can also take your own blood pressure at home with a blood pressure monitor. You may need to take your own blood pressure to: Confirm a diagnosis of high blood pressure (hypertension). Monitor your blood pressure over time. Make sure your blood pressure medicine is working. Supplies needed: Blood pressure monitor. Dining room chair to sit in. Table or desk. Small notebook and pencil or pen. How to prepare To get the most accurate reading, avoid the following for 30 minutes before you check your blood pressure: Drinking caffeine. Drinking alcohol. Eating. Smoking. Exercising. Five minutes before you check your blood pressure: Use the bathroom and urinate so that you have an empty bladder. Sit quietly in a dining room chair. Do not sit in a soft couch or an armchair. Do not  talk. How to take your blood pressure To check your blood pressure, follow the instructions in the manual that came with your blood pressure monitor. If you have a digital blood pressure monitor, the instructions may be as follows: Sit up straight in a chair. Place your feet on the floor. Do not cross your ankles or legs. Rest your left arm at the level of your heart on a table or desk or on the arm of a chair. Pull up your shirt sleeve. Wrap the blood pressure cuff around the upper part of your left arm, 1 inch (2.5 cm) above your elbow. It is best to wrap the cuff around bare skin. Fit the cuff snugly around your arm. You should be able to place only one finger between the cuff and your arm. Position the cord so that it rests in the bend of your elbow. Press the power button. Sit quietly while the cuff inflates and deflates. Read the digital reading on the monitor screen and write the numbers down (record them) in a notebook. Wait 2-3 minutes, then repeat the steps, starting at step 1. What does my blood pressure reading mean? A blood pressure reading consists of a higher number over a lower number. Ideally, your blood pressure should be below 120/80. The first ("top") number is called the systolic pressure. It is a measure of the pressure in your arteries as your heart beats. The second ("bottom") number is called the diastolic pressure. It is a measure of the pressure in your arteries  as the heart relaxes. Blood pressure is classified into five stages. The following are the stages for adults who do not have a short-term serious illness or a chronic condition. Systolic pressure and diastolic pressure are measured in a unit called mm Hg (millimeters of mercury).  Normal Systolic pressure: below 295. Diastolic pressure: below 80. Elevated Systolic pressure: 284-132. Diastolic pressure: below 80. Hypertension stage 1 Systolic pressure: 440-102. Diastolic pressure: 72-53. Hypertension stage  2 Systolic pressure: 664 or above. Diastolic pressure: 90 or above. You can have elevated blood pressure or hypertension even if only the systolic or only the diastolic number in your reading is higher than normal. Follow these instructions at home: Medicines Take over-the-counter and prescription medicines only as told by your health care provider. Tell your health care provider if you are having any side effects from blood pressure medicine. General instructions Check your blood pressure as often as recommended by your health care provider. Check your blood pressure at the same time every day. Take your monitor to the next appointment with your health care provider to make sure that: You are using it correctly. It provides accurate readings. Understand what your goal blood pressure numbers are. Keep all follow-up visits as told by your health care provider. This is important. General tips Your health care provider can suggest a reliable monitor that will meet your needs. There are several types of home blood pressure monitors. Choose a monitor that has an arm cuff. Do not choose a monitor that measures your blood pressure from your wrist or finger. Choose a cuff that wraps snugly around your upper arm. You should be able to fit only one finger between your arm and the cuff. You can buy a blood pressure monitor at most drugstores or online. Where to find more information American Heart Association: www.heart.org Contact a health care provider if: Your blood pressure is consistently high. Your blood pressure is suddenly low. Get help right away if: Your systolic blood pressure is higher than 180. Your diastolic blood pressure is higher than 120. Summary Blood pressure is a measurement of how strongly your blood is pressing against the walls of your arteries. A blood pressure reading consists of a higher number over a lower number. Ideally, your blood pressure should be below  120/80. Check your blood pressure at the same time every day. Avoid caffeine, alcohol, smoking, and exercise for 30 minutes prior to checking your blood pressure. These agents can affect the accuracy of the blood pressure reading. This information is not intended to replace advice given to you by your health care provider. Make sure you discuss any questions you have with your health care provider. Document Revised: 05/02/2020 Document Reviewed: 06/17/2019 Elsevier Patient Education  2022 [employer].

## 2021-05-09 NOTE — Progress Notes (Signed)
Subjective:  Patient ID: [name], female    DOB: March 10, 1946  Age: 75 y.o. MRN: [id number]  CC:  Chief Complaint  Patient presents with   Blood Pressure Check    Pt reports high readings for her recently. Notes concerns regarding BP would like check against her machine as well      HPI [name] presents for   Hypertension: With history of atrial fibrillation status post maze operation, mitral valve repair.  primary care provider Dr. [name].  Cardiology [name], Dr. Johney Frame.  Concern regarding some elevated home readings. Office visit October 31 noted.  Blood pressure 148/88 at that time.  Home readings higher, 180-190/100s.  Started on low-dose amlodipine 5 mg daily.  Still having some fluctuating readings. Up to 193/95, no associated symptoms. No CP/dyspnea/weakness. Chronic dyspnea, no changes. 163/83 after meds, then down to 120's/70's as day goes on. 160/79 last night. No lows, no usual lightheadedness/dizziness. Fleeting symptoms briefly today.  Brought her monitor - similar readings as in office readings.  Metoprolol BID. Amlodipine in the morning. Previous recommendation for possible change to  Frightened at night at times, hx of anxiety, lives alone. scared to make changes in meds without discussing with cardiology.              Home readings: BP Readings from Last 3 Encounters:  05/09/21 118/66  05/06/21 (!) 148/88  05/03/21 (!) 153/80   Lab Results  Component Value Date   CREATININE 0.78 12/10/2020     History Patient Active Problem List   Diagnosis Date Noted   Visit for wound check 04/29/2021   Therapeutic opioid-induced constipation (OIC) 04/03/2021   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   Atrial fibrillation ([place]) 02/09/2020   Long term (current) use of anticoagulants 02/09/2020   Acute blood loss anemia 02/09/2020   Debility 02/02/2020   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/2020   Severe mitral  regurgitation    Mitral regurgitation due to cusp prolapse    Ankle swelling, left 10/20/2019   Ankle swelling, right 10/20/2019   Secondary hypercoagulable state ([place]) 08/19/2019   Left inguinal hernia 03/11/2019   Visit for monitoring Tikosyn therapy 03/29/2018   Osteoporosis 09/23/2017   Fatigue 02/18/2017   Physical exam 04/21/2016   Positional lightheadedness 11/16/2015   B12 deficiency 10/01/2015   Hearing loss due to cerumen impaction 08/23/2015   Foot pain, right 05/23/2015   Hyperlipidemia 04/19/2015   Protein-calorie malnutrition ([place]) 10/19/2014   Fracture of multiple ribs 08/07/2014   Tachycardia    Shortness of breath 08/06/2014   Constipation 07/14/2014   Vitamin B12 deficiency anemia, unspecified 06/19/2014   Hypokalemia 06/19/2014   Other chronic pain 06/19/2014   Polyneuropathy, unspecified 06/19/2014   Zoster without complications 02/63/7858   Vitreous degeneration, bilateral 06/19/2014   Nutritional deficiency, unspecified 06/19/2014   Nocturia 85/08/7739   Other symbolic dysfunctions 28/78/6767   Unsteadiness on feet 06/19/2014   Unspecified osteoarthritis, unspecified site 06/19/2014   Muscle weakness (generalized) 06/19/2014   Major depressive disorder, single episode, unspecified 06/19/2014   Frequency of micturition 06/19/2014   Encounter for orthopedic aftercare following scoliosis surgery 06/19/2014   Cardiac arrhythmia, unspecified 06/19/2014   Age-related nuclear cataract, unspecified eye 06/19/2014   Scoliosis, unspecified 06/19/2014   Lumbar back pain 05/19/2012   Ureteric stone 02/18/2012   Leg pain, bilateral 11/17/2011   Hereditary and idiopathic peripheral neuropathy 11/06/2011   S/P cervical spinal fusion 11/06/2011   Gastro-esophageal reflux disease  without esophagitis 11/06/2011   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 11/06/2011   Personal history of other diseases of the circulatory system 11/06/2011   Allergic rhinitis,  unspecified 04/25/2010   Vulvodynia, 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   Anxiety and depression 03/04/2010   Other malaise and fatigue 02/05/2010   Vitamin D deficiency 01/28/2010   Neoplasm of uncertain behavior of skin 01/25/2010   Genital herpes 11/01/2009   Herpes simplex virus (HSV) infection 11/01/2009   Herpetic vulvovaginitis 10/23/2009   Kidney stone 08/09/2009   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   Brachial neuritis or radiculitis 07/23/2009   Past Medical History:  Diagnosis Date   Allergies    Anxiety    Arthritis    "maybe in my back" (03/31/2018)   Benign paroxysmal positional vertigo 95/28/4132   Complication of anesthesia    Fracture of multiple ribs 2015   "don't know from what; dx'd when I in hospital for 1st back OR" (03/31/2018)   GERD (gastroesophageal reflux disease)    Hair loss 04/12/2012   Herpes    History of blood transfusion    "twice; related to back OR" (03/31/2018)   History of kidney stones    Interstitial cystitis 11/06/2011   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   "right"   Mitral regurgitation    Osteopenia 02/18/2012   Osteoporosis    PAF (paroxysmal atrial fibrillation) ([place]) 2012   Peripheral neuropathy 11/06/2011   PMDD (premenstrual dysphoric disorder)    PONV (postoperative nausea and vomiting)    nausea, vomiting, hives and dizziness    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/2020   Complete bilateral atrial lesion set using cryothermy and bipolar radiofrequency ablation with clipping of LA appendage via right mini-thoracotomy approach   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   Complex valvuloplasty including artificial Gore-tex neochord placement x12 with 39mm Sorin Memo 4D ring annuloplasty   Seasonal allergies    Vaginal delivery    ONE NSVD   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   Past Surgical History:  Procedure Laterality Date   ANTERIOR CERVICAL DECOMP/DISCECTOMY FUSION  ~ 2003   BACK SURGERY     BREAST SURGERY     BREAST  BIOPSY--RIGHT BENIGN   BUNIONECTOMY Bilateral    CARDIOVERSION N/A 01/26/2020   Procedure: CARDIOVERSION;  Surgeon: Geralynn Rile, MD;  Location: [place];  Service: Cardiovascular;  Laterality: N/A;   COSMETIC SURGERY  2016   "back of my neck; related to earlier fusion"   [place]  "several times"   [place] Right    "removed bone protruding out of my forehead"   HARDWARE REMOVAL  2016   "related to neck OR"   INCONTINENCE SURGERY     IR THORACENTESIS ASP PLEURAL SPACE W/IMG GUIDE  02/16/2020   MINIMALLY INVASIVE MAZE PROCEDURE N/A 01/17/2020   Procedure: MINIMALLY INVASIVE MAZE PROCEDURE;  Surgeon: Rexene Alberts, MD;  Location: [facility];  Service: Open Heart Surgery;  Laterality: N/A;   MITRAL VALVE REPAIR Right 01/17/2020   Procedure: MINIMALLY INVASIVE MITRAL VALVE REPAIR (MVR) USING MEMO 4D 32MM;  Surgeon: Rexene Alberts, MD;  Location: [place];  Service: Open Heart Surgery;  Laterality: Right;   POSTERIOR CERVICAL FUSION/FORAMINOTOMY  ~ 2008; 2015   RIGHT/LEFT HEART CATH AND CORONARY ANGIOGRAPHY N/A 12/02/2019   Procedure: RIGHT/LEFT HEART CATH AND CORONARY ANGIOGRAPHY;  Surgeon: Burnell Blanks, MD;  Location: [facility];  Service: Cardiovascular;  Laterality: N/A;  SHOULDER ARTHROSCOPY W/ ROTATOR CUFF REPAIR Right 2012   SPINAL FUSION  06/2014 - 2018 X ?7   "scoliosis; my entire back"   TEE WITHOUT CARDIOVERSION N/A 11/21/2019   Procedure: TRANSESOPHAGEAL ECHOCARDIOGRAM (TEE);  Surgeon: Josue Hector, MD;  [facility];  Service: Cardiovascular;  Laterality: N/A;   TEE WITHOUT CARDIOVERSION N/A 01/17/2020   Procedure: TRANSESOPHAGEAL ECHOCARDIOGRAM (TEE);  Surgeon: Rexene Alberts, MD;  Location: [facility];  Service: Open Heart Surgery;  Laterality: N/A;   TEE WITHOUT CARDIOVERSION N/A 01/26/2020   Procedure: TRANSESOPHAGEAL ECHOCARDIOGRAM (TEE);  Surgeon: Geralynn Rile, MD;  Location: [place];  Service: Cardiovascular;  Laterality: N/A;   TUBAL LIGATION     VAGINAL HYSTERECTOMY     TVH   Allergies  Allergen Reactions   Buprenorphine Nausea Only and Other (See Comments)    sedation and adhesive reaction Other reaction(s): nausea, sedation and adhesive reaction   Cymbalta [Duloxetine Hcl]     Severe diarrhea and upset stomach   Erythromycin Nausea And Vomiting    Dizziness    Hydrocodone Other (See Comments)    Sedation,dizziness, and nausea Other reaction(s): sedation and nausea Other reaction(s): sedation and nausea   Hydromorphone Other (See Comments)    Sedation,dizziness, and nausea Other reaction(s): sedation and nausea Other reaction(s): sedation and nausea   Iodinated Diagnostic Agents Hives and Nausea Only   Iodine     unknown   Levofloxacin Nausea And Vomiting   Metrizamide Hives   Nsaids     CANNOT TAKE PER CARDIOLOGIST DUE TO AFIB    Nucynta [Tapentadol Hcl]     nausea and sedation   Nucynta [Tapentadol] Nausea Only   Other Other (See Comments)    CAN NOT TAKE , aLPRAZOLAM, OR ELAVIL DUE TO AFIB FOR ANXIETY  IV CONTRAST /"DYE". Other reaction(s): hives and sores   Oxycodone Other (See Comments)    Delusions (intolerance)  PILLS ONLY sedation, dizziness, nausea,  and itching Other reaction(s): sedation and nausea Other reaction(s): sedation and nausea   Pentazocine Other (See Comments)    Unknown Other reaction(s): Unknown Other reaction(s): Unknown   Septra [Bactrim]     Hives    Sulfasalazine Hives   Adhesive [Tape] Rash and Other (See Comments)    Heart monitor stickers must be rotated in order to prevent rash   Latex Palpitations and Other (See Comments)     hives and sores Other reaction(s): hives and sores   Morphine Anxiety and Other (See Comments)    sedation and nausea Other reaction(s): sedation and nausea Other reaction(s): sedation and nausea   Sulfa Antibiotics Hives, Nausea Only and Rash     rash   Prior to Admission medications   Medication Sig Start Date End Date Taking? Authorizing Provider  acetaminophen (TYLENOL) 500 MG tablet Take 500 mg by mouth every 6 (six) hours as needed for moderate pain.    Yes [provider]  amLODipine (NORVASC) 5 MG tablet Take 1 tablet (5 mg total) by mouth daily. 05/06/21  Yes Maximiano Coss, NP  b complex vitamins tablet Take 1 tablet by mouth daily.   Yes [provider]  Calcium Carbonate Antacid (TUMS CHEWY BITES PO) Take 1 tablet by mouth daily as needed (reflux).    Yes [provider]  Calcium Citrate-Vitamin D (CALCIUM + D PO) Take 1 tablet by mouth daily.   Yes [provider]  Carboxymethylcellul-Glycerin (LUBRICATING EYE DROPS OP) Place 1 drop into both eyes daily as needed (dry  eyes).   Yes [provider]  clonazePAM (KLONOPIN) 0.5 MG tablet Take 0.5 mg by mouth as needed. 03/07/21  Yes [provider]  cyclobenzaprine (FLEXERIL) 10 MG tablet Take 10 mg by mouth at bedtime as needed for muscle spasms.  12/10/17  Yes [provider]  denosumab (PROLIA) 60 MG/ML SOSY injection Inject 60 mg into the skin every 6 (six) months.   Yes [provider]  furosemide (LASIX) 20 MG tablet Take 1 tablet (20 mg total) by mouth daily. 01/23/21  Yes Freada Bergeron, MD  levothyroxine (SYNTHROID) 75 MCG tablet Take 1 tablet by mouth once daily 05/01/21  Yes [name], MD  meclizine (ANTIVERT) 25 MG tablet TAKE 1 TABLET BY MOUTH THREE TIMES DAILY AS NEEDED FOR DIZZINESS 01/28/21  Yes [name], MD  metoprolol tartrate (LOPRESSOR) 50 MG tablet Take 1 tablet (50 mg total) by mouth 2 (two) times daily. 01/23/21  Yes Freada Bergeron, MD  Nutritional Supplements (,FEEDING SUPPLEMENT, PROSOURCE PLUS) liquid Take 30 mLs by mouth 2 (two) times daily between meals. 02/08/20  Yes Love, Ivan Anchors, PA-C  nystatin (MYCOSTATIN/NYSTOP) powder Apply topically 2 (two) times daily  as needed (skin irritation (under breasts)). 02/08/20  Yes Love, Ivan Anchors, PA-C  ondansetron (ZOFRAN) 4 MG tablet Take 1 tablet (4 mg total) by mouth every 8 (eight) hours as needed. 01/31/21  Yes [name], MD  oxyCODONE (ROXICODONE) 5 MG/5ML solution Take 4 mLs (4 mg total) by mouth every 4 (four) hours as needed for moderate pain. 02/08/20  Yes Love, Ivan Anchors, PA-C  polyethylene glycol (MIRALAX / GLYCOLAX) packet Take 17 g by mouth daily as needed for mild constipation.   Yes [provider]  rivaroxaban (XARELTO) 20 MG TABS tablet Take 1 tablet (20 mg total) by mouth daily with supper. 01/23/21  Yes Freada Bergeron, MD  sodium chloride (OCEAN) 0.65 % SOLN nasal spray Place 1 spray into both nostrils as needed for congestion (nose bleeds).   Yes [provider]  triamcinolone ointment (KENALOG) 0.1 % Apply 1 application topically 2 (two) times daily. 01/15/21 01/15/22 Yes [name], MD  valACYclovir (VALTREX) 500 MG tablet Take 500 mg by mouth daily as needed (breakouts).  07/15/19  Yes [provider]   Social History   Socioeconomic History   Marital status: Divorced    Spouse name: none/divorced   Number of children: Not on file   Years of education: Not on file   Highest education level: 12th grade  Occupational History   Occupation: retired  Tobacco Use   Smoking status: Former    Packs/day: 0.10    Years: 14.00    Pack years: 1.40    Types: Cigarettes    Quit date: 1980    Years since quitting: 42.8   Smokeless tobacco: Never   Tobacco comments:    03/31/2018 "quit ~ 1980; someday smoker when I did smoke; never addicted"  Vaping Use   Vaping Use: Never used  Substance and Sexual Activity   Alcohol use: Never   Drug use: Yes    Types: Oxycodone, Benzodiazepines    Comment: 03/31/2018 "for chronic neck and back pain", takes Klonopin at times.    Sexual activity: Not Currently  Other Topics Concern   Not on file  Social History  Narrative   Lives by herself.    Social Determinants of Health   Financial Resource Strain: Not on file  Food Insecurity: No Food Insecurity  Worried About Charity fundraiser in the Last Year: Never true   [place] in the Last Year: Never true  Transportation Needs: No Transportation Needs   Lack of Transportation (Medical): No   Lack of Transportation (Non-Medical): No  Physical Activity: Not on file  Stress: No Stress Concern Present   Feeling of Stress : Only a little  Social Connections: Moderately Isolated   Frequency of Communication with Friends and Family: More than three times a week   Frequency of Social Gatherings with Friends and Family: Once a week   Attends Religious Services: 1 to 4 times per year   Active Member of [employer] or Organizations: No   Attends Archivist Meetings: Never   Marital Status: Divorced  Human resources officer Violence: Not At Risk   Fear of Current or Ex-Partner: No   Emotionally Abused: No   Physically Abused: No   Sexually Abused: No    Review of Systems Per HPI.   Objective:   Vitals:   05/09/21 1307  BP: 118/66  Pulse: 78  Resp: 17  Temp: 97.8 F (36.6 C)  TempSrc: Temporal  SpO2: 94%  Weight: 118 lb 9.6 oz (53.8 kg)  Height: 5\' 6"  (1.676 m)     Physical Exam Vitals reviewed.  Constitutional:      General: She is not in acute distress.    Appearance: Normal appearance. She is well-developed. She is not ill-appearing, toxic-appearing or diaphoretic.  HENT:     Head: Normocephalic and atraumatic.  Eyes:     Conjunctiva/sclera: Conjunctivae normal.     Pupils: Pupils are equal, round, and reactive to light.  Neck:     Vascular: No carotid bruit.  Cardiovascular:     Rate and Rhythm: Normal rate and regular rhythm.     Heart sounds: Normal heart sounds.  Pulmonary:     Effort: Pulmonary effort is normal.     Breath sounds: Normal breath sounds.  Abdominal:     Palpations: Abdomen is soft. There is no  pulsatile mass.     Tenderness: There is no abdominal tenderness.  Musculoskeletal:     Right lower leg: No edema.     Left lower leg: No edema.  Skin:    General: Skin is warm and dry.  Neurological:     Mental Status: She is alert and oriented to person, place, and time.  Psychiatric:        Mood and Affect: Mood normal.        Behavior: Behavior normal.       Assessment & Plan:  [name] is a 75 y.o. female . Primary hypertension  Anxiety and depression Home readings a improved during the middle of the day. Normal in office today. Question stress/anxiety component.  Fleeting lightheadedness briefly today, otherwise asymptomatic.  Discussed possibly changing metoprolol to once per day dosing of Toprol-XL as previously recommended by cardiology, but she would like to discuss with their office first.  No change in amlodipine for now, but if lower readings, or recurrent lightheadedness advised to decrease to 2.5 mg or one half of the current pill.  Handout given on appropriate home blood pressure monitoring with ER/urgent care/return to clinic precautions given.  Recommended follow-up with her primary care provider to discuss anxiety, stress symptoms further as that may also be contributing.  No orders of the defined types were placed in this encounter.  Patient Instructions  Based on your blood pressures in the office today  and lower readings during the day had would not recommend increasing or adding any medication at this time.  There has been discussion previously on changing your metoprolol to the once a day medication to take at night.  That is an option but if you would like to discuss it first with your cardiologist, I think that is reasonable.  See information below on how to check your blood pressure.  If any new symptoms, be seen here, urgent care or emergency room as we discussed.  I do recommend follow-up with your primary care provider to discuss some of the concerns at  home and possible stress/anxiety component.  Please let us know if we can help further and take care..   Return to the clinic or go to the nearest emergency room if any of your symptoms worsen or new symptoms occur.  How to Take Your Blood Pressure Blood pressure is a measurement of how strongly your blood is pressing against the walls of your arteries. Arteries are blood vessels that carry blood from your heart throughout your body. Your health care provider takes your blood pressure at each office visit. You can also take your own blood pressure at home with a blood pressure monitor. You may need to take your own blood pressure to: Confirm a diagnosis of high blood pressure (hypertension). Monitor your blood pressure over time. Make sure your blood pressure medicine is working. Supplies needed: Blood pressure monitor. Dining room chair to sit in. Table or desk. Small notebook and pencil or pen. How to prepare To get the most accurate reading, avoid the following for 30 minutes before you check your blood pressure: Drinking caffeine. Drinking alcohol. Eating. Smoking. Exercising. Five minutes before you check your blood pressure: Use the bathroom and urinate so that you have an empty bladder. Sit quietly in a dining room chair. Do not sit in a soft couch or an armchair. Do not talk. How to take your blood pressure To check your blood pressure, follow the instructions in the manual that came with your blood pressure monitor. If you have a digital blood pressure monitor, the instructions may be as follows: Sit up straight in a chair. Place your feet on the floor. Do not cross your ankles or legs. Rest your left arm at the level of your heart on a table or desk or on the arm of a chair. Pull up your shirt sleeve. Wrap the blood pressure cuff around the upper part of your left arm, 1 inch (2.5 cm) above your elbow. It is best to wrap the cuff around bare skin. Fit the cuff snugly around  your arm. You should be able to place only one finger between the cuff and your arm. Position the cord so that it rests in the bend of your elbow. Press the power button. Sit quietly while the cuff inflates and deflates. Read the digital reading on the monitor screen and write the numbers down (record them) in a notebook. Wait 2-3 minutes, then repeat the steps, starting at step 1. What does my blood pressure reading mean? A blood pressure reading consists of a higher number over a lower number. Ideally, your blood pressure should be below 120/80. The first ("top") number is called the systolic pressure. It is a measure of the pressure in your arteries as your heart beats. The second ("bottom") number is called the diastolic pressure. It is a measure of the pressure in your arteries as the heart relaxes. Blood pressure is classified  into five stages. The following are the stages for adults who do not have a short-term serious illness or a chronic condition. Systolic pressure and diastolic pressure are measured in a unit called mm Hg (millimeters of mercury).  Normal Systolic pressure: below 258. Diastolic pressure: below 80. Elevated Systolic pressure: 527-782. Diastolic pressure: below 80. Hypertension stage 1 Systolic pressure: 423-536. Diastolic pressure: 14-43. Hypertension stage 2 Systolic pressure: 154 or above. Diastolic pressure: 90 or above. You can have elevated blood pressure or hypertension even if only the systolic or only the diastolic number in your reading is higher than normal. Follow these instructions at home: Medicines Take over-the-counter and prescription medicines only as told by your health care provider. Tell your health care provider if you are having any side effects from blood pressure medicine. General instructions Check your blood pressure as often as recommended by your health care provider. Check your blood pressure at the same time every day. Take your  monitor to the next appointment with your health care provider to make sure that: You are using it correctly. It provides accurate readings. Understand what your goal blood pressure numbers are. Keep all follow-up visits as told by your health care provider. This is important. General tips Your health care provider can suggest a reliable monitor that will meet your needs. There are several types of home blood pressure monitors. Choose a monitor that has an arm cuff. Do not choose a monitor that measures your blood pressure from your wrist or finger. Choose a cuff that wraps snugly around your upper arm. You should be able to fit only one finger between your arm and the cuff. You can buy a blood pressure monitor at most drugstores or online. Where to find more information American Heart Association: www.heart.org Contact a health care provider if: Your blood pressure is consistently high. Your blood pressure is suddenly low. Get help right away if: Your systolic blood pressure is higher than 180. Your diastolic blood pressure is higher than 120. Summary Blood pressure is a measurement of how strongly your blood is pressing against the walls of your arteries. A blood pressure reading consists of a higher number over a lower number. Ideally, your blood pressure should be below 120/80. Check your blood pressure at the same time every day. Avoid caffeine, alcohol, smoking, and exercise for 30 minutes prior to checking your blood pressure. These agents can affect the accuracy of the blood pressure reading. This information is not intended to replace advice given to you by your health care provider. Make sure you discuss any questions you have with your health care provider. Document Revised: 05/02/2020 Document Reviewed: 06/17/2019 Elsevier Patient Education  2022 [place],   Merri Ray, MD [place] [place]  [facility] 05/09/21 1:41 PM

## 2021-05-12 ENCOUNTER — Telehealth: Payer: Self-pay | Admitting: Student

## 2021-05-12 NOTE — Telephone Encounter (Signed)
   Patient called Answering Service with question about her medications. Called and spoke with patient. Patient was wanted to know what time she should take her Xarelto given the time change. She was told to take the medicine at the same time every day so she just wanted to make sure that she was taking it correctly. She usually takes the medication between 6:30-7pm. I advised her that a one hour difference is OK and she can continue to take her Xarelto around this same time. She was voice understanding and thanked me for calling.  Darreld Mclean, PA-C 05/12/2021 4:58 PM

## 2021-05-24 NOTE — Telephone Encounter (Signed)
Pt reports that PCP prescribed Amlodipine 5 mg daily for HTN. BP still elevated after starting this, and PCP advised her discuss w/ cardiology about maybe changing to Toprol as discussed before w/ [name]. Reports SBPs going into 110s, and this made her feel drained. She goes back on Monday to PCP for a BP check as they are following.  Will forward to [name] for advisement of changing to Toprol.

## 2021-05-27 ENCOUNTER — Ambulatory Visit (INDEPENDENT_AMBULATORY_CARE_PROVIDER_SITE_OTHER): Payer: Medicare Other | Admitting: Family Medicine

## 2021-05-27 ENCOUNTER — Ambulatory Visit: Payer: Medicare Other

## 2021-05-27 VITALS — BP 110/68 | HR 66

## 2021-05-27 DIAGNOSIS — I10 Essential (primary) hypertension: Secondary | ICD-10-CM | POA: Diagnosis not present

## 2021-05-27 NOTE — Progress Notes (Signed)
[name] is a 75 y.o. female presents to the office today for Blood pressure recheck secondary to elevated BP. [Ex: elevated BP in office, med start, regimen change].  Blood pressure medication: Amlodipiine 5 mg daily (med, dose, frequency) If on medication, Last dose was at least 1-2 hours prior to recheck: Yes Blood pressure was taken in the left arm after patient rested for 3 minutes.  There were no vitals taken for this visit.  [name]

## 2021-06-05 NOTE — Progress Notes (Signed)
BP Readings from Last 3 Encounters:  05/27/21 110/68  05/09/21 118/66  05/06/21 (!) 148/88   Patient not seen. Noted BP check.

## 2021-06-12 ENCOUNTER — Other Ambulatory Visit: Payer: Self-pay | Admitting: Family Medicine

## 2021-06-12 DIAGNOSIS — E785 Hyperlipidemia, unspecified: Secondary | ICD-10-CM

## 2021-06-13 ENCOUNTER — Telehealth: Payer: Self-pay | Admitting: Cardiology

## 2021-06-13 ENCOUNTER — Encounter: Payer: Self-pay | Admitting: Cardiology

## 2021-06-13 NOTE — Telephone Encounter (Signed)
Spoke to patient today who wants to wait to make any changes at this time. Aware that we can change to Toprol if she wishes. She will wait for now.

## 2021-06-13 NOTE — Telephone Encounter (Signed)
Pt c/o Shortness Of Breath: STAT if SOB developed within the last 24 hours or pt is noticeably SOB on the phone  1. Are you currently SOB (can you hear that pt is SOB on the phone)? Yes   2. How long have you been experiencing SOB? Several weeks  3. Are you SOB when sitting or when up moving around? Both   4. Are you currently experiencing any other symptoms? Hard to breathe;

## 2021-06-13 NOTE — Telephone Encounter (Signed)
Spent over 40 minutes on the phone with patient. Pt reports she woke up at 3 am and "couldn't hardly breath". States is has been ongoing since her "OHS" in 2021, but states "much worse in past few weeks".  She could hardly get from the bedroom to the bathroom without being very short of breath according to patient. She did have some neck/back pain this morning and has taken her pain medication for that. She has not taken extra Lasix today, she did state that Dr. Johney Frame did tell her to take an extra daily if needed for swelling or dyspnea. 7 minutes into the call, with patient doing most of the talking at this time, informed patient that since being on the phone with her she is not exhibiting audible dyspnea. She said but I am and I politely told patient that I cannot hear it.  And during our 40 min phone call the only time she exhibited dyspnea is when she would get upset and/or start crying.   We then continued on about repetitive conversations about her diagnoses.  She continues to state that Dr. Roxy Manns told her she had CHF and I have informed her that her medical record does not reflect this nor does Dr. Ricard Dillon note - and we have discussed this before.  I told patient she needs to call his office to discuss this. We then discussed her chronic diastolic dysfunction diagnosis.   Patient feels it is her "CHF" that is causing all of this.  When asked about this current dyspnea issue she is calling in about she says this is not changed, but that it "has been  awhile since I have been seen". When informed that she was seen in our office in September and October for this same complaint she "doesn't remember that" and "is going to check her book that she keeps everything in".  She says I am not saying you are wrong but I don't remember that. Discussed that her SOB is chronic/ongoing issue.  She has had negative PFTs, echocardiogram from April unrevealing as well.   Aware I will forward to Dr. Johney Frame  and her nurse to address treatment plan with patient. She understands someone will follow up about if she needs sooner appt w/ Dr. Johney Frame than February of next year

## 2021-06-17 NOTE — Telephone Encounter (Signed)
Noted in epic that pt has an upcoming appt with her PCP to discuss BP management.  Appt with PCP is scheduled for 12/15.

## 2021-06-18 ENCOUNTER — Other Ambulatory Visit: Payer: Self-pay

## 2021-06-18 DIAGNOSIS — R0602 Shortness of breath: Secondary | ICD-10-CM

## 2021-06-18 DIAGNOSIS — Z9889 Other specified postprocedural states: Secondary | ICD-10-CM

## 2021-06-20 ENCOUNTER — Encounter: Payer: Self-pay | Admitting: Family Medicine

## 2021-06-20 ENCOUNTER — Ambulatory Visit (INDEPENDENT_AMBULATORY_CARE_PROVIDER_SITE_OTHER): Payer: Medicare Other | Admitting: Family Medicine

## 2021-06-20 VITALS — BP 116/62 | HR 71 | Temp 97.5°F | Resp 16 | Wt 119.6 lb

## 2021-06-20 DIAGNOSIS — F419 Anxiety disorder, unspecified: Secondary | ICD-10-CM | POA: Diagnosis not present

## 2021-06-20 DIAGNOSIS — F32A Depression, unspecified: Secondary | ICD-10-CM

## 2021-06-20 DIAGNOSIS — I10 Essential (primary) hypertension: Secondary | ICD-10-CM

## 2021-06-20 DIAGNOSIS — R0602 Shortness of breath: Secondary | ICD-10-CM | POA: Diagnosis not present

## 2021-06-20 MED ORDER — SERTRALINE HCL 25 MG PO TABS: 25.0000 mg | ORAL_TABLET | Freq: Every day | ORAL | 3 refills | Status: DC

## 2021-06-20 NOTE — Assessment & Plan Note (Addendum)
Ongoing issue for pt.  Over the years we have tried multiple medications and pt has been 'intolerant' to all of them.  She states at this point she would like to try again b/c she feels if she can control her anxiety, things might improve.  Will restart low dose Sertraline and monitor for improvement.  Pt expressed understanding and is in agreement w/ plan.  Total time spent w/ pt- 40 minutes w/ >50% spent counseling on anxiety, medication management, need for therapy.

## 2021-06-20 NOTE — Progress Notes (Signed)
° °  Subjective:    Patient ID: [name], female    DOB: [birth year], 75 y.o.   MRN: [id number]  HPI HTN- pt was started on Amlodipine in late October due to elevated BP readings.  BP has been excellently controlled since then.  Also on Lasix 20mg , Metoprolol 50mg  BID.  Pt notes that BPs run higher in the AM.  She takes her medication at 8am and BP starts to come down.  Pt notes some increased fatigue.  Pt would like to switch her Metoprolol to extended release.  When I offered to switch the medication for her, she got very anxious and said 'i want to be sure first'.  Wants to ask Cardiology their opinion.  + SOB- pt reports ongoing SOB since heart surgery.  Has had pulmonary fxn testing.  Pt states it's hard to push the vacuum, at times has to sit while shopping.  Pt has a 'stair chair' at home so she doesn't have to climb stairs.  States she does a lot of walking around the house but I'm not sure how to quantify that.  ECHO on 10/22/20 showed normal EF but mildly enlarged RV  Anxiety- pt reports being 'a basket case'.  This is an ongoing issue for her.  Pt reports finding a counselor but 'he doesn't take insurance'.  He charges $135/visit so pt has yet to see him.  Pt feels like she would like a medication but she has been intolerant to every medication we have tried over the years.   Review of Systems For ROS see HPI   This visit occurred during the SARS-CoV-2 public health emergency.  Safety protocols were in place, including screening questions prior to the visit, additional usage of staff PPE, and extensive cleaning of exam room while observing appropriate contact time as indicated for disinfecting solutions.      Objective:   Physical Exam Vitals reviewed.  Constitutional:      General: She is not in acute distress.    Appearance: She is not ill-appearing.  HENT:     Head: Normocephalic and atraumatic.  Eyes:     Extraocular Movements: Extraocular movements intact.      Conjunctiva/sclera: Conjunctivae normal.  Cardiovascular:     Rate and Rhythm: Normal rate and regular rhythm.  Pulmonary:     Effort: Pulmonary effort is normal. No respiratory distress.     Breath sounds: No wheezing, rhonchi or rales.  Musculoskeletal:     Right lower leg: No edema.     Left lower leg: No edema.  Neurological:     Mental Status: She is alert and oriented to person, place, and time. Mental status is at baseline.  Psychiatric:        Behavior: Behavior normal.     Comments: Anxious, circular thought process          Assessment & Plan:

## 2021-06-20 NOTE — Assessment & Plan Note (Signed)
Ongoing issue for pt.  She has seen Cardiology, Pulmonary, had ECHO, and PFTs.  I suspect her SOB is due- at least in part- to deconditioning.  Encouraged her to try and build her stamina and walk/move more during her day.  Will follow.

## 2021-06-20 NOTE — Patient Instructions (Addendum)
Follow up in 6 weeks to recheck mood CONTINUE the Amlodipine daily START the Sertraline once daily If you decide to change the Metoprolol to once daily- let me know I think some of your shortness of breath is deconditioning/lack of stamina Please try and establish w/ the counselor to talk through your concerns Call with any questions or concerns Stay Safe!  Stay Healthy! Happy Holidays!!

## 2021-06-20 NOTE — Assessment & Plan Note (Signed)
BP well controlled w/ addition of Amlodipine.  Pt stated she wanted to switch her Metoprolol to extended release but then when I offered, she wasn't sure.  She will discuss w/ cards.  No med changes at this time.

## 2021-06-24 ENCOUNTER — Other Ambulatory Visit: Payer: Self-pay | Admitting: Family Medicine

## 2021-06-24 DIAGNOSIS — M5412 Radiculopathy, cervical region: Secondary | ICD-10-CM | POA: Diagnosis not present

## 2021-06-24 DIAGNOSIS — M961 Postlaminectomy syndrome, not elsewhere classified: Secondary | ICD-10-CM | POA: Diagnosis not present

## 2021-06-24 DIAGNOSIS — M4693 Unspecified inflammatory spondylopathy, cervicothoracic region: Secondary | ICD-10-CM | POA: Diagnosis not present

## 2021-06-24 DIAGNOSIS — G894 Chronic pain syndrome: Secondary | ICD-10-CM | POA: Diagnosis not present

## 2021-06-24 DIAGNOSIS — Z79891 Long term (current) use of opiate analgesic: Secondary | ICD-10-CM | POA: Diagnosis not present

## 2021-06-25 NOTE — Telephone Encounter (Signed)
Pt called checking on this she is aware that Dr. [name] has already left the office for the day and this could take anywhere from 24-72 hrs.  Please advise

## 2021-06-26 ENCOUNTER — Telehealth: Payer: Self-pay | Admitting: Family Medicine

## 2021-06-26 NOTE — Chronic Care Management (AMB) (Signed)
°  Chronic Care Management   Outreach Note  06/26/2021 Name: [name] MRN: [id number] DOB: 09-18-1945  Referred by: [name], MD Reason for referral : No chief complaint on file.   A second unsuccessful telephone outreach was attempted today. The patient was referred to pharmacist for assistance with care management and care coordination.  Follow Up Plan:   Tatjana Dellinger Upstream Scheduler

## 2021-07-01 DIAGNOSIS — Z20822 Contact with and (suspected) exposure to covid-19: Secondary | ICD-10-CM | POA: Diagnosis not present

## 2021-07-02 ENCOUNTER — Telehealth: Payer: Self-pay | Admitting: Family Medicine

## 2021-07-02 NOTE — Chronic Care Management (AMB) (Signed)
°  Chronic Care Management   Note  07/02/2021 Name: [name] MRN: [id number] DOB: June 19, 1946  [name] is a 75 y.o. year old female who is a primary care patient of [name], [name], MD. I reached out to [name] by phone today in response to a referral sent by [name] PCP, [name], MD.   [name] was given information about Chronic Care Management services today including:  CCM service includes personalized support from designated clinical staff supervised by her physician, including individualized plan of care and coordination with other care providers 24/7 contact phone numbers for assistance for urgent and routine care needs. Service will only be billed when office clinical staff spend 20 minutes or more in a month to coordinate care. Only one practitioner may furnish and bill the service in a calendar month. The patient may stop CCM services at any time (effective at the end of the month) by phone call to the office staff.   Patient agreed to services and verbal consent obtained.   Follow up plan: PT AWARE DEDUCTIBLE   [place]

## 2021-07-10 ENCOUNTER — Other Ambulatory Visit: Payer: Self-pay | Admitting: Registered Nurse

## 2021-07-10 DIAGNOSIS — I10 Essential (primary) hypertension: Secondary | ICD-10-CM

## 2021-07-11 ENCOUNTER — Encounter: Payer: Self-pay | Admitting: Family Medicine

## 2021-07-15 ENCOUNTER — Encounter: Payer: Self-pay | Admitting: Family Medicine

## 2021-07-15 DIAGNOSIS — K219 Gastro-esophageal reflux disease without esophagitis: Secondary | ICD-10-CM | POA: Diagnosis not present

## 2021-07-15 DIAGNOSIS — R1319 Other dysphagia: Secondary | ICD-10-CM | POA: Diagnosis not present

## 2021-07-16 ENCOUNTER — Ambulatory Visit (INDEPENDENT_AMBULATORY_CARE_PROVIDER_SITE_OTHER): Payer: Medicare Other | Admitting: Surgical

## 2021-07-16 ENCOUNTER — Other Ambulatory Visit: Payer: Self-pay

## 2021-07-16 VITALS — BP 124/78 | HR 83 | Resp 20 | Ht 66.0 in | Wt 118.0 lb

## 2021-07-16 DIAGNOSIS — Z9889 Other specified postprocedural states: Secondary | ICD-10-CM | POA: Diagnosis not present

## 2021-07-16 DIAGNOSIS — Z5189 Encounter for other specified aftercare: Secondary | ICD-10-CM

## 2021-07-16 NOTE — Progress Notes (Signed)
Subjective:    Patient ID: [name], female    DOB: 12/26/1945, 76 y.o.   MRN: [id number]  Chief Complaint  Patient presents with   Follow-up    Sternal wound check   CARDIOTHORACIC SURGERY OPERATIVE NOTE   Date of Procedure:                            01/17/2020   Preoperative Diagnosis:         Severe Mitral Regurgitation Recurrent Persistent Atrial Fibrillation   Postoperative Diagnosis:    Same   Procedure:        Minimally-invasive Mitral Valve Repair             Complex valvuloplasty including artificial Gore-tex neochord replacement x 12             Sorin Memo 4D Ring Annuloplasty (size 26m, catalog #[id number], serial #[id number]             Median sternotomy   Minimally-invasive Maze Procedure             Complete bilateral atrial lesion set using cryothermy and bipolar radiofrequency ablation             Clipping of Left Atrial Appendage (Atricure left atrial clip, size 45 mm)   Median Sternotomy for Repair of Left Ventricular Free-wall Laceration     Surgeon:        CValentina Gu ORoxy Manns MD   HPI Patient is in today for patient is a 76year old female status post the above procedure who has had some difficulty over the past several months with the previously placed sternotomy straps done for closure.  She is seen in the office on today's date in follow-up.  She currently is not having any difficulties.  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[place] ~ 2003   "right"    Mitral regurgitation    Osteopenia 02/18/2012   Osteoporosis    PAF ([place]oxysmal atrial fibrillation) [place] 2012   Peripheral neuropathy 11/06/2011   PMDD (premenstrual dysphoric disorder)    PONV (postoperative nausea and vomiting)    nausea, vomiting, hives and dizziness    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/2020   Complete bilateral atrial lesion set using cryothermy and bipolar radiofrequency ablation with clipping of LA appendage via right mini-thoracotomy approach   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   Complex valvuloplasty including artificial Gore-tex neochord placement x12 with 346mSorin Memo 4D ring annuloplasty   Seasonal allergies    Vaginal delivery    ONE NSVD   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    Past Surgical History:  Procedure Laterality Date   ANTERIOR CERVICAL DECOMP/DISCECTOMY FUSION  ~ 2003   BACK SURGERY     BREAST SURGERY     BREAST BIOPSY--RIGHT BENIGN   BUNIONECTOMY Bilateral    CARDIOVERSION N/A 01/26/2020  Procedure: CARDIOVERSION;  Surgeon: Geralynn Rile, MD;  Location: [place];  Service: Cardiovascular;  Laterality: N/A;   COSMETIC SURGERY  2016   "back of my neck; related to earlier fusion"   [place]  "several times"   [place] Right    "removed bone protruding out of my forehead"   HARDWARE REMOVAL  2016   "related to neck OR"   INCONTINENCE SURGERY     IR THORACENTESIS ASP PLEURAL SPACE W/IMG GUIDE  02/16/2020   MINIMALLY INVASIVE MAZE PROCEDURE N/A 01/17/2020   Procedure: MINIMALLY INVASIVE MAZE PROCEDURE;  Surgeon: Rexene Alberts, MD;  Location: [place];  Service: Open Heart Surgery;  Laterality: N/A;   MITRAL VALVE REPAIR Right 01/17/2020   Procedure: MINIMALLY INVASIVE MITRAL VALVE REPAIR (MVR) USING MEMO 4D 32MM;  Surgeon: Rexene Alberts, MD;  Location: [facility];  Service: Open Heart Surgery;  Laterality: Right;   POSTERIOR CERVICAL  FUSION/FORAMINOTOMY  ~ 2008; 2015   RIGHT/LEFT HEART CATH AND CORONARY ANGIOGRAPHY N/A 12/02/2019   Procedure: RIGHT/LEFT HEART CATH AND CORONARY ANGIOGRAPHY;  Surgeon: Burnell Blanks, MD;  Location: [facility];  Service: Cardiovascular;  Laterality: N/A;   SHOULDER ARTHROSCOPY W/ ROTATOR CUFF REPAIR Right 2012   SPINAL FUSION  06/2014 - 2018 X ?7   "scoliosis; my entire back"   TEE WITHOUT CARDIOVERSION N/A 11/21/2019   Procedure: TRANSESOPHAGEAL ECHOCARDIOGRAM (TEE);  Surgeon: Josue Hector, MD;  [facility];  Service: Cardiovascular;  Laterality: N/A;   TEE WITHOUT CARDIOVERSION N/A 01/17/2020   Procedure: TRANSESOPHAGEAL ECHOCARDIOGRAM (TEE);  Surgeon: Rexene Alberts, MD;  Location: [place];  Service: Open Heart Surgery;  Laterality: N/A;   TEE WITHOUT CARDIOVERSION N/A 01/26/2020   Procedure: TRANSESOPHAGEAL ECHOCARDIOGRAM (TEE);  Surgeon: Geralynn Rile, MD;  Location: [place];  Service: Cardiovascular;  Laterality: N/A;   TUBAL LIGATION     VAGINAL HYSTERECTOMY     TVH    Family History  Problem Relation Age of Onset   Diabetes Father    Hyperlipidemia Sister    Heart disease Sister    Stroke Sister    Diabetes Brother    Hyperlipidemia Sister    Heart disease Sister    Arthritis Mother    Heart disease Mother    Uterine cancer Mother    Diabetes Brother    Heart Problems Brother     Social History   Socioeconomic History   Marital status: Divorced    Spouse name: none/divorced   Number of children: Not on file   Years of education: Not on file   Highest education level: 12th grade  Occupational History   Occupation: retired  Tobacco Use   Smoking status: Former    Packs/day: 0.10    Years: 14.00    Pack years: 1.40    Types: Cigarettes    Quit date: 1980    Years since quitting: 43.0   Smokeless tobacco: Never   Tobacco comments:    03/31/2018 "quit ~ 1980; someday smoker when I did smoke; never addicted"  Vaping Use    Vaping Use: Never used  Substance and Sexual Activity   Alcohol use: Never   Drug use: Yes    Types: Oxycodone, Benzodiazepines    Comment: 03/31/2018 "for chronic neck and back pain", takes Klonopin at times.    Sexual activity: Not Currently  Other Topics Concern   Not on file  Social  History Narrative   Lives by herself.    Social Determinants of Health   Financial Resource Strain: Not on file  Food Insecurity: No Food Insecurity   Worried About Charity fundraiser in the Last Year: Never true   Ran Out of Food in the Last Year: Never true  Transportation Needs: No Transportation Needs   Lack of Transportation (Medical): No   Lack of Transportation (Non-Medical): No  Physical Activity: Not on file  Stress: Not on file  Social Connections: Not on file  Intimate Partner Violence: Not At Risk   Fear of Current or Ex-Partner: No   Emotionally Abused: No   Physically Abused: No   Sexually Abused: No    Outpatient Medications Prior to Visit  Medication Sig Dispense Refill   acetaminophen (TYLENOL) 500 MG tablet Take 500 mg by mouth every 6 (six) hours as needed for moderate pain.      b complex vitamins tablet Take 1 tablet by mouth daily.     Calcium Carbonate Antacid (TUMS CHEWY BITES PO) Take 1 tablet by mouth daily as needed (reflux).      Calcium Citrate-Vitamin D (CALCIUM + D PO) Take 1 tablet by mouth daily.     Carboxymethylcellul-Glycerin (LUBRICATING EYE DROPS OP) Place 1 drop into both eyes daily as needed (dry eyes).     clonazePAM (KLONOPIN) 0.5 MG tablet Take 1 tablet by mouth twice daily as needed for anxiety 30 tablet 0   cyclobenzaprine (FLEXERIL) 10 MG tablet Take 10 mg by mouth at bedtime as needed for muscle spasms.   5   denosumab (PROLIA) 60 MG/ML SOSY injection Inject 60 mg into the skin every 6 (six) months.     furosemide (LASIX) 20 MG tablet Take 1 tablet (20 mg total) by mouth daily. 90 tablet 3   levothyroxine (SYNTHROID) 75 MCG tablet Take 1  tablet by mouth once daily 90 tablet 0   meclizine (ANTIVERT) 25 MG tablet TAKE 1 TABLET BY MOUTH THREE TIMES DAILY AS NEEDED FOR DIZZINESS 30 tablet 0   metoprolol tartrate (LOPRESSOR) 50 MG tablet Take 1 tablet (50 mg total) by mouth 2 (two) times daily. 180 tablet 3   Nutritional Supplements (,FEEDING SUPPLEMENT, PROSOURCE PLUS) liquid Take 30 mLs by mouth 2 (two) times daily between meals. 887 mL 0   nystatin (MYCOSTATIN/NYSTOP) powder Apply topically 2 (two) times daily as needed (skin irritation (under breasts)). 15 g 0   ondansetron (ZOFRAN) 4 MG tablet Take 1 tablet (4 mg total) by mouth every 8 (eight) hours as needed. 30 tablet 0   oxyCODONE (ROXICODONE) 5 MG/5ML solution Take 4 mLs (4 mg total) by mouth every 4 (four) hours as needed for moderate pain.  0   polyethylene glycol (MIRALAX / GLYCOLAX) packet Take 17 g by mouth daily as needed for mild constipation.     rivaroxaban (XARELTO) 20 MG TABS tablet Take 1 tablet (20 mg total) by mouth daily with supper. 30 tablet 6   sodium chloride (OCEAN) 0.65 % SOLN nasal spray Place 1 spray into both nostrils as needed for congestion (nose bleeds).     triamcinolone ointment (KENALOG) 0.1 % Apply 1 application topically 2 (two) times daily. 90 g 1   valACYclovir (VALTREX) 500 MG tablet Take 500 mg by mouth daily as needed (breakouts).      amLODipine (NORVASC) 5 MG tablet Take 1 tablet by mouth once daily (Patient not taking: Reported on 07/16/2021) 90 tablet 0   sertraline (ZOLOFT) 25  MG tablet Take 1 tablet (25 mg total) by mouth daily. (Patient not taking: Reported on 07/16/2021) 30 tablet 3   No facility-administered medications prior to visit.    Allergies  Allergen Reactions   Buprenorphine Nausea Only and Other (See Comments)    sedation and adhesive reaction Other reaction(s): nausea, sedation and adhesive reaction   Cymbalta [Duloxetine Hcl]     Severe diarrhea and upset stomach   Erythromycin Nausea And Vomiting    Dizziness     Hydrocodone Other (See Comments)    Sedation,dizziness, and nausea Other reaction(s): sedation and nausea Other reaction(s): sedation and nausea   Hydromorphone Other (See Comments)    Sedation,dizziness, and nausea Other reaction(s): sedation and nausea Other reaction(s): sedation and nausea   Iodinated Contrast Media Hives and Nausea Only   Iodine     unknown   Levofloxacin Nausea And Vomiting   Metrizamide Hives   Nsaids     CANNOT TAKE PER CARDIOLOGIST DUE TO AFIB    Nucynta [Tapentadol Hcl]     nausea and sedation   Nucynta [Tapentadol] Nausea Only   Other Other (See Comments)    CAN NOT TAKE , aLPRAZOLAM, OR ELAVIL DUE TO AFIB FOR ANXIETY  IV CONTRAST /"DYE". Other reaction(s): hives and sores   Oxycodone Other (See Comments)    Delusions (intolerance)  PILLS ONLY sedation, dizziness, nausea,  and itching Other reaction(s): sedation and nausea Other reaction(s): sedation and nausea   Pentazocine Other (See Comments)    Unknown Other reaction(s): Unknown Other reaction(s): Unknown   Septra [Bactrim]     Hives    Sulfasalazine Hives   Adhesive [Tape] Rash and Other (See Comments)    Heart monitor stickers must be rotated in order to prevent rash   Latex Palpitations and Other (See Comments)     hives and sores Other reaction(s): hives and sores   Morphine Anxiety and Other (See Comments)    sedation and nausea Other reaction(s): sedation and nausea Other reaction(s): sedation and nausea   Sulfa Antibiotics Hives, Nausea Only and Rash    rash        Objective:    Physical Exam Constitutional:      Appearance: She is not ill-appearing.  HENT:     Head: Normocephalic and atraumatic.  Cardiovascular:     Rate and Rhythm: Normal rate and regular rhythm.     Heart sounds: No murmur heard.   No gallop.  Pulmonary:     Effort: Pulmonary effort is normal.     Breath sounds: Normal breath sounds.  Abdominal:     General: Abdomen is flat.  Musculoskeletal:      Right lower leg: No edema.     Left lower leg: No edema.  Skin:    General: Skin is warm and dry.  Neurological:     Mental Status: She is alert and oriented to person, place, and time.    BP 124/78 (BP Location: Left Arm, Patient Position: Sitting)    Pulse 83    Resp 20    Ht '5\' 6"'  (1.676 m)    Wt 118 lb (53.5 kg)    SpO2 93% Comment: RA   BMI 19.05 kg/m  Wt Readings from Last 3 Encounters:  07/16/21 118 lb (53.5 kg)  06/20/21 119 lb 9.6 oz (54.3 kg)  05/09/21 118 lb 9.6 oz (53.8 kg)    Health Maintenance Due  Topic Date Due   Zoster Vaccines- Shingrix (2 of 2) 09/02/2010   COVID-19  Vaccine (3 - Pfizer risk series) 09/29/2019    There are no preventive care reminders to display for this patient.   Lab Results  Component Value Date   TSH 0.22 (L) 04/17/2021   Lab Results  Component Value Date   WBC 5.8 12/10/2020   HGB 12.5 12/10/2020   HCT 38.2 12/10/2020   MCV 87.3 12/10/2020   PLT 191.0 12/10/2020   Lab Results  Component Value Date   NA 142 12/10/2020   K 4.3 12/10/2020   CO2 30 12/10/2020   GLUCOSE 86 12/10/2020   BUN 22 12/10/2020   CREATININE 0.78 12/10/2020   BILITOT 0.4 12/10/2020   ALKPHOS 58 12/10/2020   AST 22 12/10/2020   ALT 17 12/10/2020   PROT 6.9 12/10/2020   ALBUMIN 4.2 12/10/2020   CALCIUM 9.1 12/10/2020   ANIONGAP 9 02/06/2020   EGFR 84 11/14/2020   GFR 73.81 12/10/2020   Lab Results  Component Value Date   CHOL 175 12/10/2020   Lab Results  Component Value Date   HDL 80.40 12/10/2020   Lab Results  Component Value Date   LDLCALC 79 12/10/2020   Lab Results  Component Value Date   TRIG 79.0 12/10/2020   Lab Results  Component Value Date   CHOLHDL 2 12/10/2020   Lab Results  Component Value Date   HGBA1C 5.6 04/16/2020       Assessment & Plan:   Problem List Items Addressed This Visit     S/P mitral valve repair   Visit for wound check - Primary     No orders of the defined types were placed in this  encounter.  Assessment/plan: Well-healed sternotomy  No plans for surgical intervention unless further difficulties develop.  John Giovanni, PA-C

## 2021-07-16 NOTE — Patient Instructions (Signed)
F/u prn

## 2021-07-23 DIAGNOSIS — F331 Major depressive disorder, recurrent, moderate: Secondary | ICD-10-CM | POA: Diagnosis not present

## 2021-07-30 ENCOUNTER — Other Ambulatory Visit: Payer: Self-pay | Admitting: Family Medicine

## 2021-07-30 DIAGNOSIS — E785 Hyperlipidemia, unspecified: Secondary | ICD-10-CM

## 2021-08-01 ENCOUNTER — Encounter: Payer: Self-pay | Admitting: Family Medicine

## 2021-08-01 ENCOUNTER — Other Ambulatory Visit: Payer: Self-pay | Admitting: Family Medicine

## 2021-08-01 ENCOUNTER — Ambulatory Visit (INDEPENDENT_AMBULATORY_CARE_PROVIDER_SITE_OTHER): Payer: Medicare Other | Admitting: Family Medicine

## 2021-08-01 VITALS — BP 120/72 | HR 79 | Temp 98.2°F | Resp 16 | Wt 119.0 lb

## 2021-08-01 DIAGNOSIS — F32A Depression, unspecified: Secondary | ICD-10-CM | POA: Diagnosis not present

## 2021-08-01 DIAGNOSIS — F419 Anxiety disorder, unspecified: Secondary | ICD-10-CM

## 2021-08-01 NOTE — Assessment & Plan Note (Signed)
Ongoing issue.  She again has not taken the medication prescribed.  She did find a counselor that she is excited to work with.  I applauded her outlook and willingness to start therapy.  I have been encouraging her for years to work w/ someone.  She will hold off on Sertraline at this time.  Will follow.

## 2021-08-01 NOTE — Patient Instructions (Signed)
Follow up as scheduled for April I'm so glad that you found someone to work with! I'm excited for the progress you will make! Call with any questions or concerns Stay Safe!  Stay Healthy! HAPPY BIRTHDAY!!!

## 2021-08-01 NOTE — Progress Notes (Signed)
° °  Subjective:    Patient ID: [name], female    DOB: [birth year], 76 y.o.   MRN: [id number]  HPI Anxiety/Depression- at last visit pt was started on Sertraline.  'i didn't take it'.  Pt reached out to [facility]- started seeing counselor.  'i wanted to see if I could do it this way w/o taking another medicine'.  Pt is seeing him weekly- but has only had 1 appt.  Pt feels that this will be a good fit.   Review of Systems For ROS see HPI   This visit occurred during the SARS-CoV-2 public health emergency.  Safety protocols were in place, including screening questions prior to the visit, additional usage of staff PPE, and extensive cleaning of exam room while observing appropriate contact time as indicated for disinfecting solutions.      Objective:   Physical Exam Vitals reviewed.  Constitutional:      General: She is not in acute distress.    Appearance: Normal appearance. She is not ill-appearing.  HENT:     Head: Normocephalic and atraumatic.  Eyes:     Extraocular Movements: Extraocular movements intact.     Conjunctiva/sclera: Conjunctivae normal.     Pupils: Pupils are equal, round, and reactive to light.  Skin:    General: Skin is warm and dry.  Neurological:     General: No focal deficit present.     Mental Status: She is alert and oriented to person, place, and time. Mental status is at baseline.  Psychiatric:        Mood and Affect: Mood normal.        Behavior: Behavior normal.        Thought Content: Thought content normal.          Assessment & Plan:

## 2021-08-02 ENCOUNTER — Encounter: Payer: Self-pay | Admitting: Family Medicine

## 2021-08-06 DIAGNOSIS — F331 Major depressive disorder, recurrent, moderate: Secondary | ICD-10-CM | POA: Diagnosis not present

## 2021-08-07 ENCOUNTER — Encounter: Payer: Self-pay | Admitting: Family Medicine

## 2021-08-07 DIAGNOSIS — G894 Chronic pain syndrome: Secondary | ICD-10-CM | POA: Diagnosis not present

## 2021-08-07 DIAGNOSIS — M546 Pain in thoracic spine: Secondary | ICD-10-CM | POA: Diagnosis not present

## 2021-08-07 DIAGNOSIS — M62838 Other muscle spasm: Secondary | ICD-10-CM | POA: Diagnosis not present

## 2021-08-07 DIAGNOSIS — M542 Cervicalgia: Secondary | ICD-10-CM | POA: Diagnosis not present

## 2021-08-07 DIAGNOSIS — Z981 Arthrodesis status: Secondary | ICD-10-CM | POA: Diagnosis not present

## 2021-08-07 NOTE — Progress Notes (Signed)
Cardiology Office Note:    Date:  08/19/2021   ID:  Regina, [name] [birth year], MRN [id number]  PCP:  [name], MD   [facility] Providers Cardiologist:  Freada Bergeron, MD Electrophysiologist:  Constance Haw, MD  {   Referring MD: [name], MD     History of Present Illness:    [name] is a 76 y.o. female with a hx of severe MR s/p MV repair (32 mm Sorin Memo 4D ring annuloplasty valve) complicated by LV free wall laceration requiring median sternotomy, pAfib, scoliosis requiring multiple spine surgeries and anxiety who presents to clinic for follow-up.   Per review of the record, patient has been followed for MVP/MR. TEE on 11/21/19 demonstrated presence of mitral valve prolapse with a large flail segment felt to be the A3 portion of the anterior leaflet with severe mitral regurgitation. She was seen by Dr. Ricard Dillon and underwent Minimally Invasive Mitral Valve Repair, Complete MAZE procedure, and Sternotomy with repair of Left Ventricular Free- wall laceration on 01/17/20.  She was extubated the evening of surgery. She was started on coumadin for her Mitral Valve Repair.  Her post-op course was complicated by acute blood loss anemia requiring blood transfusions and Afib with RVR for which she was placed on Amiodarone and lopressor and her tikosyn was discontinued. She follows with [name] in clinic and has since been transitioned to [place].   Seen in clinic 08/23/20 where she was having right sided chest pain/burning where her incision site was located. She was very concerned that it may be related to her heart or breast cancer. Given localization around scar tissue site, nonexertional nature, and no masses, suspected it was likely related to neuropathic pain and scar tissue. CTA, chest xray, d-dimer all reassuringly normal. Given her significant allergies to medications, we recommended she follow-up with her chronic pain physician.  Saw Dr. Roxy Manns  on 01/15/21 where she was deemed to be stable from a CV standpoint.  Was seen in clinic on 01/23/21 where she was doing to complain of intermittent shortness of breath, chronic pain and depression. No changes in meds at that time.  Last seen in 04/2021 by Dr. Marlou Porch for Afib. At that visit, she was back in NSR and she was continued on metop and xarelto.   Patient states that she overall feels okay. No significant chest pain or SOB. Edema is stable. Has been compliant with her medications.  She has several questions today. She is having difficulty affording her xarelto each month and is asking for patient assistance and has brought forms today. Also concerned about which hospital to go if she were to get sick and we discussed she should go to [facility] or [facility] but if emergent, she should go to the nearest hospital to her.  She is also concerned that she lives alone and wants to ensure her symptoms are managed appropriately. Otherwise, she continues to have a lot of stress in her life as she has suffered from several losses in her family.     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   "right"   Mitral regurgitation    Osteopenia 02/18/2012   Osteoporosis    PAF (paroxysmal atrial fibrillation) ([place]) 2012   Peripheral neuropathy 11/06/2011   PMDD (premenstrual dysphoric disorder)    PONV (postoperative nausea and vomiting)    nausea, vomiting, hives and dizziness    S/P Maze operation for atrial  fibrillation 01/17/2020   Complete bilateral atrial lesion set using cryothermy and bipolar radiofrequency ablation with clipping of LA appendage via right mini-thoracotomy approach   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   Complex valvuloplasty including artificial Gore-tex neochord placement x12 with 83mm Sorin Memo 4D ring annuloplasty   Seasonal allergies    Vaginal delivery    ONE NSVD   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    Past Surgical History:  Procedure Laterality Date   ANTERIOR CERVICAL DECOMP/DISCECTOMY FUSION  ~ 2003   BACK SURGERY     BREAST SURGERY     BREAST BIOPSY--RIGHT BENIGN   BUNIONECTOMY Bilateral    CARDIOVERSION N/A 01/26/2020   Procedure: CARDIOVERSION;  Surgeon: Geralynn Rile, MD;  Location: [place];  Service: Cardiovascular;  Laterality: N/A;   COSMETIC SURGERY  2016   "back of my neck; related to earlier fusion"   [place]  "several times"   [place] Right    "removed bone protruding out of my forehead"   HARDWARE REMOVAL  2016   "related to neck OR"   INCONTINENCE SURGERY     IR THORACENTESIS ASP PLEURAL SPACE W/IMG GUIDE  02/16/2020   MINIMALLY INVASIVE MAZE PROCEDURE N/A 01/17/2020   Procedure: MINIMALLY INVASIVE MAZE PROCEDURE;  Surgeon: Rexene Alberts, MD;  Location: [place];  Service: Open Heart Surgery;  Laterality: N/A;   MITRAL VALVE REPAIR Right 01/17/2020   Procedure: MINIMALLY INVASIVE MITRAL VALVE REPAIR (MVR) USING MEMO 4D 32MM;  Surgeon: Rexene Alberts, MD;  Location: [place];  Service: Open Heart Surgery;  Laterality: Right;   POSTERIOR CERVICAL FUSION/FORAMINOTOMY  ~ 2008; 2015   RIGHT/LEFT HEART CATH AND CORONARY ANGIOGRAPHY N/A 12/02/2019   Procedure: RIGHT/LEFT HEART CATH AND CORONARY ANGIOGRAPHY;  Surgeon: Burnell Blanks, MD;  Location: [place] CV LAB;  Service: Cardiovascular;  Laterality: N/A;   SHOULDER ARTHROSCOPY W/ ROTATOR CUFF REPAIR Right 2012    SPINAL FUSION  06/2014 - 2018 X ?7   "scoliosis; my entire back"   TEE WITHOUT CARDIOVERSION N/A 11/21/2019   Procedure: TRANSESOPHAGEAL ECHOCARDIOGRAM (TEE);  Surgeon: Josue Hector, MD;  Location[facility] END[facility]COPY;  Service: Cardiovascular;  Laterality: N/A;   TEE WITHOUT CARDIOVERSION N/A 01/17/2020   Procedure: TRANSESOPHAGEAL ECHOCARDIOGRAM (TEE);  Surgeon: Rexene Alberts, MD;  Location: [place];  Service: Open Heart Surgery;  Laterality: N/A;   TEE WITHOUT CARDIOVERSION N/A 01/26/2020   Procedure: TRANSESOPHAGEAL ECHOCARDIOGRAM (TEE);  Surgeon: Geralynn Rile, MD;  Location: [place];  Service: Cardiovascular;  Laterality: N/A;   TUBAL LIGATION     VAGINAL HYSTERECTOMY     TVH    Current Medications: Current Meds  Medication Sig   acetaminophen (TYLENOL) 500 MG tablet Take 500 mg by mouth every 6 (six) hours as needed for moderate pain.    b complex vitamins tablet Take 1 tablet by mouth daily.  Calcium Carbonate Antacid (TUMS CHEWY BITES PO) Take 1 tablet by mouth daily as needed (reflux).    Calcium Citrate-Vitamin D (CALCIUM + D PO) Take 1 tablet by mouth daily.   Carboxymethylcellul-Glycerin (LUBRICATING EYE DROPS OP) Place 1 drop into both eyes daily as needed (dry eyes).   clonazePAM (KLONOPIN) 0.5 MG tablet Take 1 tablet by mouth twice daily as needed for anxiety   cyclobenzaprine (FLEXERIL) 10 MG tablet Take 10 mg by mouth at bedtime as needed for muscle spasms.    denosumab (PROLIA) 60 MG/ML SOSY injection Inject 60 mg into the skin every 6 (six) months.   furosemide (LASIX) 20 MG tablet Take 1 tablet (20 mg total) by mouth daily.   levothyroxine (SYNTHROID) 75 MCG tablet Take 1 tablet by mouth once daily   meclizine (ANTIVERT) 25 MG tablet TAKE 1 TABLET BY MOUTH THREE TIMES DAILY AS NEEDED FOR DIZZINESS   metoprolol succinate (TOPROL-XL) 50 MG 24 hr tablet Take 1 tablet (50 mg total) by mouth at bedtime. Take with or immediately following a meal.   metoprolol  tartrate (LOPRESSOR) 25 MG tablet Take 1 tablet (25 mg total) by mouth 3 (three) times daily as needed (PALPITATINS).   Nutritional Supplements (,FEEDING SUPPLEMENT, PROSOURCE PLUS) liquid Take 30 mLs by mouth 2 (two) times daily between meals.   nystatin (MYCOSTATIN/NYSTOP) powder Apply topically 2 (two) times daily as needed (skin irritation (under breasts)).   ondansetron (ZOFRAN) 4 MG tablet Take 1 tablet (4 mg total) by mouth every 8 (eight) hours as needed.   oxyCODONE (ROXICODONE) 5 MG/5ML solution Take 4 mLs (4 mg total) by mouth every 4 (four) hours as needed for moderate pain.   polyethylene glycol (MIRALAX / GLYCOLAX) packet Take 17 g by mouth daily as needed for mild constipation.   rivaroxaban (XARELTO) 20 MG TABS tablet Take 1 tablet (20 mg total) by mouth daily with supper.   sodium chloride (OCEAN) 0.65 % SOLN nasal spray Place 1 spray into both nostrils as needed for congestion (nose bleeds).   valACYclovir (VALTREX) 500 MG tablet Take 500 mg by mouth daily as needed (breakouts).    [DISCONTINUED] metoprolol tartrate (LOPRESSOR) 50 MG tablet Take 1 tablet (50 mg total) by mouth 2 (two) times daily.     Allergies:   Buprenorphine, Cymbalta [duloxetine hcl], Erythromycin, Hydrocodone, Hydromorphone, Iodinated contrast media, Iodine, Levofloxacin, Metrizamide, Nsaids, Nucynta [tapentadol hcl], Nucynta [tapentadol], Other, Oxycodone, Pentazocine, Septra [bactrim], Sulfasalazine, Adhesive [tape], Latex, Morphine, and Sulfa antibiotics   Social History   Socioeconomic History   Marital status: Divorced    Spouse name: none/divorced   Number of children: Not on file   Years of education: Not on file   Highest education level: 12th grade  Occupational History   Occupation: retired  Tobacco Use   Smoking status: Former    Packs/day: 0.10    Years: 14.00    Pack years: 1.40    Types: Cigarettes    Quit date: 1980    Years since quitting: 43.1   Smokeless tobacco: Never    Tobacco comments:    03/31/2018 "quit ~ 1980; someday smoker when I did smoke; never addicted"  Vaping Use   Vaping Use: Never used  Substance and Sexual Activity   Alcohol use: Never   Drug use: Yes    Types: Oxycodone, Benzodiazepines    Comment: 03/31/2018 "for chronic neck and back pain", takes Klonopin at times.    Sexual activity: Not Currently  Other Topics Concern   Not  on file  Social History Narrative   Lives by herself.    Social Determinants of Health   Financial Resource Strain: Not on file  Food Insecurity: Not on file  Transportation Needs: Not on file  Physical Activity: Not on file  Stress: Not on file  Social Connections: Not on file     Family History: The patient's family history includes Arthritis in her mother; Diabetes in her brother, brother, and father; Heart Problems in her brother; Heart disease in her mother, sister, and sister; Hyperlipidemia in her sister and sister; Stroke in her sister; Uterine cancer in her mother.  ROS:   Please see the history of present illness.    Review of Systems  Constitutional:  Positive for malaise/fatigue. Negative for chills and fever.  HENT:  Negative for sore throat.   Eyes:  Negative for blurred vision.  Respiratory:  Negative for shortness of breath.   Cardiovascular:  Negative for chest pain, palpitations, orthopnea, claudication, leg swelling and PND.  Gastrointestinal:  Positive for abdominal pain. Negative for nausea and vomiting.  Genitourinary:  Negative for hematuria.  Musculoskeletal:  Positive for back pain, joint pain, myalgias and neck pain.  Neurological:  Positive for weakness. Negative for dizziness and loss of consciousness.  Psychiatric/Behavioral:  Positive for depression.     EKGs/Labs/Other Studies Reviewed:    The following studies were reviewed today: TEE February 10, 2020: IMPRESSIONS   1. Left ventricular ejection fraction, by estimation, is 55 to 60%. The  left ventricle has normal function.  The left ventricle has no regional  wall motion abnormalities.   2. Right ventricular systolic function is normal. The right ventricular  size is normal.   3. S/p surgical LAA clipping. No residual connection noted with the LA.  No LA thrombus. No left atrial/left atrial appendage thrombus was  detected.   4. 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 No residual MR. MVA by  direct 3D MPR assessment 2.6 cm2. The mitral valve has been  repaired/replaced. No evidence of mitral valve regurgitation. No evidence  of mitral stenosis. The mean mitral valve  gradient is 4.0 mmHg with average heart rate of 111 bpm. There is a 32 mm  prosthetic annuloplasty ring present in the mitral position. Procedure  Date: 01/17/2020.   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   6. The aortic valve is tricuspid. Aortic valve regurgitation is not  visualized. No aortic stenosis is present.   TTE 02/29/20: IMPRESSIONS   1. There has been no change since the last study on 02-10-20. Normal  LVEF 60-65% and normal transmitral gradients, mean 4 mmHg.   2. Left ventricular ejection fraction, by estimation, is 60 to 65%. The  left ventricle has normal function. The left ventricle has no regional  wall motion abnormalities. Left ventricular diastolic function could not  be evaluated.   3. Right ventricular systolic function is normal. The right ventricular  size is normal. There is mildly elevated pulmonary artery systolic  pressure. The estimated right ventricular systolic pressure is 47.8 mmHg.   4. The mitral valve has been repaired/replaced. Trivial mitral valve  regurgitation. No evidence of mitral stenosis. The mean mitral valve  gradient is 3.0 mmHg. There is a 32 mm Sorin Memo 4D ring annuloplasty  present in the mitral position. Procedure  Date: 01/17/20.   5. Tricuspid valve regurgitation is mild to moderate.   6. The aortic valve is normal in structure. Aortic valve regurgitation is  trivial. No aortic  stenosis is  present.   7. The inferior vena cava is normal in size with greater than 50%  respiratory variability, suggesting right atrial pressure of 3 mmHg.   RHC/LHC 12/02/19: Prox RCA lesion is 20% stenosed. Dist LAD lesion is 20% stenosed.   1. Mild non-obstructive CAD   Recommendation: Continue planning for mitral valve repair.     Most Recent Value  Fick Cardiac Output 5.84 L/min  Fick Cardiac Output Index 3.58 (L/min)/BSA  RA A Wave 2 mmHg  RA V Wave 1 mmHg  RA Mean 0 mmHg  RV Systolic Pressure 29 mmHg  RV Diastolic Pressure -2 mmHg  RV EDP 3 mmHg  PA Systolic Pressure 32 mmHg  PA Diastolic Pressure 10 mmHg  PA Mean 21 mmHg  PW A Wave 22 mmHg  PW V Wave 20 mmHg  PW Mean 13 mmHg  AO Systolic Pressure 381 mmHg  AO Diastolic Pressure 61 mmHg  AO Mean 88 mmHg  LV Systolic Pressure 829 mmHg  LV Diastolic Pressure 1 mmHg  LV EDP 9 mmHg  AOp Systolic Pressure 937 mmHg  AOp Diastolic Pressure 66 mmHg  AOp Mean Pressure 93 mmHg  LVp Systolic Pressure 169 mmHg  LVp Diastolic Pressure 0 mmHg  LVp EDP Pressure 7 mmHg        EKG:  No new tracing today  Recent Labs: 09/21/2020: Magnesium 2.0 11/14/2020: NT-Pro BNP 790 12/10/2020: ALT 17 04/17/2021: TSH 0.22 08/13/2021: BUN 10; Creatinine, Ser 0.60; Hemoglobin 13.1; Platelets 121.0; Potassium 4.3; Sodium 137  Recent Lipid Panel    Component Value Date/Time   CHOL 175 12/10/2020 1338   TRIG 79.0 12/10/2020 1338   HDL 80.40 12/10/2020 1338   CHOLHDL 2 12/10/2020 1338   VLDL 15.8 12/10/2020 1338   LDLCALC 79 12/10/2020 1338   LDLDIRECT 136.5 06/08/2013 1049     Risk Assessment/Calculations:    CHA2DS2-VASc Score =    This indicates a  % annual risk of stroke. The patient's score is based upon:          Physical Exam:    VS:  BP 132/78    Pulse 63    Ht 5\' 6"  (1.676 m)    Wt 117 lb 12.8 oz (53.4 kg)    SpO2 96%    BMI 19.01 kg/m     Wt Readings from Last 3 Encounters:  08/19/21 117 lb 12.8 oz (53.4 kg)   08/13/21 116 lb 6.4 oz (52.8 kg)  08/01/21 119 lb (54 kg)     GEN: Elderly frail female, comfortable, NAD HEENT: Normal NECK: No JVD; No carotid bruits CARDIAC: RRR, 1/6 systolic murmur. No rubs, gallops RESPIRATORY:  Diminished but clear ABDOMEN: Soft, non-tender, non-distended MUSCULOSKELETAL:  Chronic venous stasis changes, no significant edema SKIN: Warm and dry NEUROLOGIC:  Alert and oriented x 3 PSYCHIATRIC:  Depressed  ASSESSMENT:    1. Paroxysmal atrial fibrillation (HCC)   2. S/P mitral valve repair   3. S/P Maze operation for atrial fibrillation   4. Chronic diastolic heart failure ([place])   5. Physical deconditioning   6. Chronic venous stasis   7. SOB (shortness of breath)    PLAN:    In order of problems listed above:  #Severe MR s/p Mitral Valve Repair: Stable on TTE 02/2020 with LVEF 60-65%, normal RV size and function, RVSP 56mmHg, mean gradient across mitral 70mmHg, trivial MR. Continues to have some shortness of breath with exertion, but overall stable. Likely related to deconditioning with multiple surgeries for her back and recent  MVR. No significant CAD on cath.  -S/p MV repair 01/2020; trivial MR with mean gradient 73mmHg in 10/2020 which is stable -Continue lasix 20mg  PO daily -Continue IE prophylaxis for dental procedures   #Chronic Diastolic HF: #DOE: Stable. TTE 10/22/20 with LVEF 50-55%, mean MV gradient 60mmHg, trivial MR. Suspect secondary to deconditioning and some diastolic dysfunction.  -Continue lasix 20mg  daily -Low Na diet  #Paroxysmal Afib: CHADs-vasc 3. Managed by [name]. -Off amiodarone  -Change metop to 50mg  XL daily per patient request -Will give metop tartrate 25mg  TID for breakthrough palpitations -Changed from warfarin to xarelto   #Nonobstructive CAD: Pre-operative cath 11/2019 with mild disease (20%) in RCA and LAD. -Patient declined statin therapy as she has significant medication reactions and wants to avoiding trying  something new   #Scoliosis: #LE weakness #Neuropathy S/p multiple correctional back surgeries. Walks with cane or walker.  -Follow-up with primary provider as scheduled -Pain management following   #Depression: -Continue counseling -Follow-up with PCP as scheduled  #Chronic venous stasis: -Continue compression socks       Medication Adjustments/Labs and Tests Ordered: Current medicines are reviewed at length with the patient today.  Concerns regarding medicines are outlined above.  No orders of the defined types were placed in this encounter.   Meds ordered this encounter  Medications   metoprolol succinate (TOPROL-XL) 50 MG 24 hr tablet    Sig: Take 1 tablet (50 mg total) by mouth at bedtime. Take with or immediately following a meal.    Dispense:  90 tablet    Refill:  3   metoprolol tartrate (LOPRESSOR) 25 MG tablet    Sig: Take 1 tablet (25 mg total) by mouth 3 (three) times daily as needed (PALPITATINS).    Dispense:  180 tablet    Refill:  3    Patient Instructions  Medication Instructions:  Your physician has recommended you make the following change in your medication:   STOP Lopressor  START Metoprolol Succinate 50 mg taking 1 at bedtime  START Metoprolol Tartrate 25 mg taking 1 tablet three times a day only as needed for palpitations. IF YOUR TOP # OF YOUR BLOOD PRESSURE IS LESS THAN 100, DO NOT TAKE THE METOPROLOL TARTRATE    *If you need a refill on your cardiac medications before your next appointment, please call your pharmacy*   Lab Work: None ordered  If you have labs (blood work) drawn today and your tests are completely normal, you will receive your results only by: MyChart Message (if you have MyChart) OR A paper copy in the mail If you have any lab test that is abnormal or we need to change your treatment, we will call you to review the results.   Testing/Procedures: None ordered   Follow-Up: At [facility], you and your health  needs are our priority.  As part of our continuing mission to provide you with exceptional heart care, we have created designated Provider Care Teams.  These Care Teams include your primary Cardiologist (physician) and Advanced Practice Providers (APPs -  Physician Assistants and Nurse Practitioners) who all work together to provide you with the care you need, when you need it.  We recommend signing up for the patient portal called "MyChart".  Sign up information is provided on this After Visit Summary.  MyChart is used to connect with patients for Virtual Visits (Telemedicine).  Patients are able to view lab/test results, encounter notes, upcoming appointments, etc.  Non-urgent messages can be sent to your provider  as well.   To learn more about what you can do with MyChart, go to [email or web address].    Your next appointment:   6 month(s)  The format for your next appointment:   In Person  Provider:   Freada Bergeron, MD     Other Instructions     Signed, Freada Bergeron, MD  08/19/2021 12:55 PM    [place]

## 2021-08-08 ENCOUNTER — Encounter: Payer: Self-pay | Admitting: Family Medicine

## 2021-08-08 DIAGNOSIS — U071 COVID-19: Secondary | ICD-10-CM | POA: Diagnosis not present

## 2021-08-08 DIAGNOSIS — R509 Fever, unspecified: Secondary | ICD-10-CM | POA: Diagnosis not present

## 2021-08-08 DIAGNOSIS — R051 Acute cough: Secondary | ICD-10-CM | POA: Diagnosis not present

## 2021-08-08 NOTE — Telephone Encounter (Signed)
Patient wants to know is thee anything you recommend for a sore throat being that she has a heart problem.

## 2021-08-12 DIAGNOSIS — U071 COVID-19: Secondary | ICD-10-CM | POA: Diagnosis not present

## 2021-08-13 ENCOUNTER — Encounter: Payer: Self-pay | Admitting: Family Medicine

## 2021-08-13 ENCOUNTER — Encounter: Payer: Self-pay | Admitting: Registered Nurse

## 2021-08-13 ENCOUNTER — Ambulatory Visit (INDEPENDENT_AMBULATORY_CARE_PROVIDER_SITE_OTHER): Payer: Medicare Other | Admitting: Registered Nurse

## 2021-08-13 VITALS — BP 124/66 | HR 87 | Temp 97.8°F | Resp 17 | Ht 66.0 in | Wt 116.4 lb

## 2021-08-13 DIAGNOSIS — J22 Unspecified acute lower respiratory infection: Secondary | ICD-10-CM

## 2021-08-13 DIAGNOSIS — J029 Acute pharyngitis, unspecified: Secondary | ICD-10-CM | POA: Diagnosis not present

## 2021-08-13 DIAGNOSIS — U071 COVID-19: Secondary | ICD-10-CM

## 2021-08-13 LAB — BASIC METABOLIC PANEL
BUN: 10 mg/dL (ref 6–23)
CO2: 35 mEq/L — ABNORMAL HIGH (ref 19–32)
Calcium: 9 mg/dL (ref 8.4–10.5)
Chloride: 98 mEq/L (ref 96–112)
Creatinine, Ser: 0.6 mg/dL (ref 0.40–1.20)
GFR: 86.81 mL/min (ref >60.00)
Glucose, Bld: 92 mg/dL (ref 70–99)
Potassium: 4.3 mEq/L (ref 3.5–5.1)
Sodium: 137 mEq/L (ref 135–145)

## 2021-08-13 LAB — POCT URINALYSIS DIPSTICK
Bilirubin, UA: NEGATIVE
Blood, UA: NEGATIVE
Glucose, UA: NEGATIVE
Ketones, UA: 5
Leukocytes, UA: NEGATIVE
Nitrite, UA: NEGATIVE
Protein, UA: POSITIVE — AB
Spec Grav, UA: 1.015 (ref 1.010–1.025)
Urobilinogen, UA: 0.2 E.U./dL
pH, UA: 6.5 (ref 5.0–8.0)

## 2021-08-13 LAB — CBC
HCT: 40.2 % (ref 36.0–46.0)
Hemoglobin: 13.1 g/dL (ref 12.0–15.0)
MCHC: 32.5 g/dL (ref 30.0–36.0)
MCV: 89 fl (ref 78.0–100.0)
Platelets: 121 10*3/uL — ABNORMAL LOW (ref 150.0–400.0)
RBC: 4.51 Mil/uL (ref 3.87–5.11)
RDW: 13.6 % (ref 11.5–15.5)
WBC: 3.2 10*3/uL — ABNORMAL LOW (ref 4.0–10.5)

## 2021-08-13 MED ORDER — DOXYCYCLINE HYCLATE 100 MG PO TABS: 100.0000 mg | ORAL_TABLET | Freq: Two times a day (BID) | ORAL | 0 refills | Status: DC

## 2021-08-13 MED ORDER — AMOXICILLIN-POT CLAVULANATE 875-125 MG PO TABS: 1.0000 | ORAL_TABLET | Freq: Two times a day (BID) | ORAL | 0 refills | Status: DC

## 2021-08-13 NOTE — Progress Notes (Unsigned)
Chronic Care Management Pharmacy Note  08/13/2021 Name:  NICK ARMEL MRN:  [id number] DOB:  [birth year]  Summary: ***  Recommendations/Changes made from today's visit: ***  Plan: ***   Subjective: [name] is an 76 y.o. year old female who is a primary patient of Tabori, [name], MD.  The CCM team was consulted for assistance with disease management and care coordination needs.    Engaged with patient by telephone for initial visit in response to provider referral for pharmacy case management and/or care coordination services.   Consent to Services:  The patient was given the following information about Chronic Care Management services today, agreed to services, and gave verbal consent: 1. CCM service includes personalized support from designated clinical staff supervised by the primary care provider, including individualized plan of care and coordination with other care providers 2. 24/7 contact phone numbers for assistance for urgent and routine care needs. 3. Service will only be billed when office clinical staff spend 20 minutes or more in a month to coordinate care. 4. Only one practitioner may furnish and bill the service in a calendar month. 5.The patient may stop CCM services at any time (effective at the end of the month) by phone call to the office staff. 6. The patient will be responsible for cost sharing (co-pay) of up to 20% of the service fee (after annual deductible is met). Patient agreed to services and consent obtained.  Patient Care Team: [name], MD as PCP - General (Family Medicine) Constance Haw, MD as PCP - Electrophysiology (Cardiology) Jerline Pain, MD as PCP - Cardiology (Cardiology) Domingo Pulse, MD as Consulting Physician (Urology) Richmond Campbell, MD as Consulting Physician (Gastroenterology) Atilano Ina, MD as Referring Physician (Neurosurgery) Terrance Mass, MD (Inactive) as Consulting Physician  (Gynecology) Jacolyn Reedy, MD as Consulting Physician (Cardiology) Kriste Basque, MD as Referring Physician (Pain Medicine) [facility] [place] Rolm Bookbinder, MD as Consulting Physician (Dermatology) Sahni Kerbs, MD as Consulting Physician (Specialist) Rexene Alberts, MD (Inactive) as Consulting Physician (Cardiothoracic Surgery) Melvenia Beam, MD as Consulting Physician (Neurology) [name], DDS as Consulting Physician (Dentistry) Madelin Rear, [facility] as Pharmacist (Pharmacist) Melvenia Beam, MD as Consulting Physician (Neurology) [name], [facility] as Pharmacist (Pharmacist)  Recent office visits:  08/13/21 Maximiano Coss, NP - Family Medicine - doxycycline po bid x 1 week. Pt to continue tylenol, ok to use neti pot, warm tea with honey, rest, hydrate. Follow up if no improvement.    08/01/21 [name], MD - Family Medicine - Anxiety - No medication changes. Follow up as scheduled in April.    06/20/21 [name], MD - Anxiety - sertraline (ZOLOFT) 25 MG tablet Take 1 tablet (25 mg total) by mouth daily prescribed. Continue Amlodipine. Follow up in 6 weeks.    05/27/21 Merri Ray, NP - Family Medicine - BP check - Amlodipine 5 mg 1 tablet daily prescribed. Follow up as scheduled.    05/09/21 Merri Ray. NP - Family Medicine - Hypertension - No medication changes. Follow up as scheduled.    05/06/21 Maximiano Coss, NP - Hypertension - amLODipine (NORVASC) 5 MG tablet Take 1 tablet (5 mg total) by mouth daily prescribed. Nurse visit 2-3 weeks.   04/25/21 Candee Furbish, MD - Afib - Cardiology - EKG performed.  No medication changes. Follow up as scheduled.    04/17/21 [name], MD - [place] were ordered. Follow up in 6 months.  Recent consult visits:  08/07/21 [place] - S/P spinal fusion - No medication changes. Follow up in 1 year.   07/16/21 Jadene Pierini, PA-C - Cardiothorac -  Wound check - No changes noted. Follow up as scheduled.    04/15/21 Kriste Basque - Chronic pain syndrome - No notes available.    04/03/21 Richmond Campbell - Gastroenterology - Therapeutic opioid constipation - No notes available.    04/01/21 Catalina Pizza - Orthopedic Surgery - Senile Osteoporosis - Labs were ordered. Follow up as scheduled.    03/26/21 Ralene Ok - Unilateral recurrent inguinal hernia - No changes noted. Follow up as scheduled.    03/25/21 Barrington Ellison - SOB - Cardiology - EKG performed. Follow up in 9 months.    03/21/21 Pauline Good - Varicose veins - Vascular surgery -  Continue to wear compression stockings as needed. Follow up with vascular surgery as needed     Hospital visits:  None in previous 6 months   Objective:  Lab Results  Component Value Date   CREATININE 0.78 12/10/2020   BUN 22 12/10/2020   GFR 73.81 12/10/2020   EGFR 84 11/14/2020   GFRNONAA >60 02/06/2020   GFRAA >60 02/06/2020   NA 142 12/10/2020   K 4.3 12/10/2020   CALCIUM 9.1 12/10/2020   CO2 30 12/10/2020   GLUCOSE 86 12/10/2020    Lab Results  Component Value Date/Time   HGBA1C 5.6 04/16/2020 04:09 PM   HGBA1C 5.3 01/13/2020 12:00 PM   GFR 73.81 12/10/2020 01:38 PM   GFR 68.71 07/11/2020 10:03 AM    Last diabetic Eye exam: No results found for: HMDIABEYEEXA  Last diabetic Foot exam: No results found for: HMDIABFOOTEX   Lab Results  Component Value Date   CHOL 175 12/10/2020   HDL 80.40 12/10/2020   LDLCALC 79 12/10/2020   LDLDIRECT 136.5 06/08/2013   TRIG 79.0 12/10/2020   CHOLHDL 2 12/10/2020    Hepatic Function Latest Ref Rng & Units 12/10/2020 07/11/2020 04/16/2020  Total Protein 6.0 - 8.3 g/dL 6.9 7.4 6.9  Albumin 3.5 - 5.2 g/dL 4.2 4.3 -  AST 0 - 37 U/L 22 30 -  ALT 0 - 35 U/L 17 24 -  Alk Phosphatase 39 - 117 U/L 58 61 -  Total Bilirubin 0.2 - 1.2 mg/dL 0.4 0.4 -  Bilirubin, Direct 0.0 - 0.3 mg/dL 0.1 0.1 -    Lab Results  Component Value Date/Time    TSH 0.22 (L) 04/17/2021 12:27 PM   TSH 0.63 12/10/2020 01:38 PM   FREET4 1.58 04/17/2021 12:27 PM   FREET4 1.34 12/10/2020 01:38 PM    CBC Latest Ref Rng & Units 12/10/2020 07/11/2020 02/06/2020  WBC 4.0 - 10.5 K/uL 5.8 5.8 6.8  Hemoglobin 12.0 - 15.0 g/dL 12.5 11.7(L) 9.7(L)  Hematocrit 36.0 - 46.0 % 38.2 36.1 33.0(L)  Platelets 150.0 - 400.0 K/uL 191.0 192.0 355    Lab Results  Component Value Date/Time   VD25OH 27.58 (L) 09/23/2017 02:20 PM   VD25OH 46 10/26/2015 11:40 AM   VD25OH 27 (L) 07/25/2015 12:31 PM    Clinical ASCVD: {YES/NO:21197} The 10-year ASCVD risk score (Arnett DK, et al., 2019) is: 31.2%   Values used to calculate the score:     Age: 72 years     Sex: Female     Is Non-Hispanic African American: No     Diabetic: No     Tobacco smoker: No     Systolic Blood Pressure: 644 mmHg  Is BP treated: Yes     HDL Cholesterol: 80.4 mg/dL     Total Cholesterol: 175 mg/dL    Depression screen [facility] 2/9 06/20/2021 05/09/2021 05/06/2021  Decreased Interest '1 1 1  ' Down, Depressed, Hopeless 0 0 1  PHQ - 2 Score '1 1 2  ' Altered sleeping 1 0 0  Tired, decreased energy '1 1 1  ' Change in appetite 0 0 0  Feeling bad or failure about yourself  0 0 0  Trouble concentrating 1 0 0  Moving slowly or fidgety/restless 0 0 0  Suicidal thoughts 0 0 0  PHQ-9 Score '4 2 3  ' Difficult doing work/chores Not difficult at all - Somewhat difficult  Some recent data might be hidden     ***Other: (CHADS2VASc if Afib, MMRC or CAT for COPD, ACT, DEXA)  Social History   Tobacco Use  Smoking Status Former   Packs/day: 0.10   Years: 14.00   Pack years: 1.40   Types: Cigarettes   Quit date: 1980   Years since quitting: 43.1  Smokeless Tobacco Never  Tobacco Comments   03/31/2018 "quit ~ 1980; someday smoker when I did smoke; never addicted"   BP Readings from Last 3 Encounters:  08/01/21 120/72  07/16/21 124/78  06/20/21 116/62   Pulse Readings from Last 3 Encounters:  08/01/21 79   07/16/21 83  06/20/21 71   Wt Readings from Last 3 Encounters:  08/01/21 119 lb (54 kg)  07/16/21 118 lb (53.5 kg)  06/20/21 119 lb 9.6 oz (54.3 kg)   BMI Readings from Last 3 Encounters:  08/01/21 19.21 kg/m  07/16/21 19.05 kg/m  06/20/21 19.30 kg/m    Assessment/Interventions: Review of patient past medical history, allergies, medications, health status, including review of consultants reports, laboratory and other test data, was performed as part of comprehensive evaluation and provision of chronic care management services.   SDOH:  (Social Determinants of Health) assessments and interventions performed: {yes/no:20286}  SDOH Screenings   Alcohol Screen: Not on file  Depression (PHQ2-9): Low Risk    PHQ-2 Score: 4  Financial Resource Strain: Not on file  Food Insecurity: Not on file  Housing: Not on file  Physical Activity: Not on file  Social Connections: Not on file  Stress: Not on file  Tobacco Use: Medium Risk   Smoking Tobacco Use: Former   Smokeless Tobacco Use: Never   Passive Exposure: Not on file  Transportation Needs: Not on file    CCM Care Plan  Allergies  Allergen Reactions   Buprenorphine Nausea Only and Other (See Comments)    sedation and adhesive reaction Other reaction(s): nausea, sedation and adhesive reaction   Cymbalta [Duloxetine Hcl]     Severe diarrhea and upset stomach   Erythromycin Nausea And Vomiting    Dizziness    Hydrocodone Other (See Comments)    Sedation,dizziness, and nausea Other reaction(s): sedation and nausea Other reaction(s): sedation and nausea   Hydromorphone Other (See Comments)    Sedation,dizziness, and nausea Other reaction(s): sedation and nausea Other reaction(s): sedation and nausea   Iodinated Contrast Media Hives and Nausea Only   Iodine     unknown   Levofloxacin Nausea And Vomiting   Metrizamide Hives   Nsaids     CANNOT TAKE PER CARDIOLOGIST DUE TO AFIB    Nucynta [Tapentadol Hcl]     nausea  and sedation   Nucynta [Tapentadol] Nausea Only   Other Other (See Comments)    CAN NOT TAKE , aLPRAZOLAM, OR  ELAVIL DUE TO AFIB FOR ANXIETY  IV CONTRAST /"DYE". Other reaction(s): hives and sores   Oxycodone Other (See Comments)    Delusions (intolerance)  PILLS ONLY sedation, dizziness, nausea,  and itching Other reaction(s): sedation and nausea Other reaction(s): sedation and nausea   Pentazocine Other (See Comments)    Unknown Other reaction(s): Unknown Other reaction(s): Unknown   Septra [Bactrim]     Hives    Sulfasalazine Hives   Adhesive [Tape] Rash and Other (See Comments)    Heart monitor stickers must be rotated in order to prevent rash   Latex Palpitations and Other (See Comments)     hives and sores Other reaction(s): hives and sores   Morphine Anxiety and Other (See Comments)    sedation and nausea Other reaction(s): sedation and nausea Other reaction(s): sedation and nausea   Sulfa Antibiotics Hives, Nausea Only and Rash    rash    Medications Reviewed Today     Reviewed by [name], MD (Physician) on 08/01/21 at 1308  Med List Status: <None>   Medication Order Taking? Sig Documenting Provider Last Dose Status Informant  acetaminophen (TYLENOL) 500 MG tablet [id number] Yes Take 500 mg by mouth every 6 (six) hours as needed for moderate pain.  [provider] Taking Active Self  amLODipine (NORVASC) 5 MG tablet [id number] No Take 1 tablet by mouth once daily  Patient not taking: Reported on 07/16/2021   Maximiano Coss, NP Not Taking Active   b complex vitamins tablet [id number] Yes Take 1 tablet by mouth daily. [provider] Taking Active Self  Calcium Carbonate Antacid (TUMS CHEWY BITES PO) [id number] Yes Take 1 tablet by mouth daily as needed (reflux).  [provider] Taking Active Self  Calcium Citrate-Vitamin D (CALCIUM + D PO) [id number] Yes Take 1 tablet by mouth daily. [provider] Taking Active    Carboxymethylcellul-Glycerin (LUBRICATING EYE DROPS OP) [id number] Yes Place 1 drop into both eyes daily as needed (dry eyes). [provider] Taking Active Self  clonazePAM (KLONOPIN) 0.5 MG tablet [id number] No Take 1 tablet by mouth twice daily as needed for anxiety  Patient not taking: Reported on 08/01/2021   [name], MD Not Taking Active   cyclobenzaprine (FLEXERIL) 10 MG tablet [id number] Yes Take 10 mg by mouth at bedtime as needed for muscle spasms.  [provider] Taking Active Self  denosumab (PROLIA) 60 MG/ML SOSY injection [id number] Yes Inject 60 mg into the skin every 6 (six) months. [provider] Taking Active Self           Med Note Shari Heritage Jun 11, 2020  2:33 PM)    furosemide (LASIX) 20 MG tablet [id number] Yes Take 1 tablet (20 mg total) by mouth daily. Freada Bergeron, MD Taking Active   levothyroxine (SYNTHROID) 75 MCG tablet [id number] Yes Take 1 tablet by mouth once daily [name], MD Taking Active   meclizine (ANTIVERT) 25 MG tablet [id number] Yes TAKE 1 TABLET BY MOUTH THREE TIMES DAILY AS NEEDED FOR DIZZINESS [name], MD Taking Active   metoprolol tartrate (LOPRESSOR) 50 MG tablet [id number] Yes Take 1 tablet (50 mg total) by mouth 2 (two) times daily. Freada Bergeron, MD Taking Active   Nutritional Supplements (,FEEDING SUPPLEMENT, PROSOURCE PLUS) liquid [id number] Yes Take 30 mLs by mouth 2 (two) times daily between meals. Bary Leriche, PA-C Taking Active   nystatin (MYCOSTATIN/NYSTOP) powder [id number] Yes Apply topically 2 (two) times  daily as needed (skin irritation (under breasts)). Bary Leriche, PA-C Taking Active   ondansetron (ZOFRAN) 4 MG tablet [id number] Yes Take 1 tablet (4 mg total) by mouth every 8 (eight) hours as needed. [name], MD Taking Active   oxyCODONE (ROXICODONE) 5 MG/5ML solution [id number] Yes Take 4 mLs (4 mg total) by mouth every 4 (four) hours as  needed for moderate pain. Bary Leriche, PA-C Taking Active            Med Note Shari Heritage Jun 11, 2020  2:33 PM)    polyethylene glycol [facility] / GLYCOLAX) packet [id number] Yes Take 17 g by mouth daily as needed for mild constipation. [provider] Taking Active Self  rivaroxaban (XARELTO) 20 MG TABS tablet [id number] Yes Take 1 tablet (20 mg total) by mouth daily with supper. Freada Bergeron, MD Taking Active   sertraline (ZOLOFT) 25 MG tablet [id number] No Take 1 tablet (25 mg total) by mouth daily.  Patient not taking: Reported on 07/16/2021   [name], MD Not Taking Active   sodium chloride (OCEAN) 0.65 % SOLN nasal spray [id number] Yes Place 1 spray into both nostrils as needed for congestion (nose bleeds). [provider] Taking Active Self  triamcinolone ointment (KENALOG) 0.1 % [id number] Yes Apply 1 application topically 2 (two) times daily. [name], MD Taking Active   valACYclovir (VALTREX) 500 MG tablet [id number] Yes Take 500 mg by mouth daily as needed (breakouts).  [provider] Taking Active Self            Patient Active Problem List   Diagnosis Date Noted   HTN (hypertension) 06/20/2021   Visit for wound check 04/29/2021   Therapeutic opioid-induced constipation (OIC) 04/03/2021   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   Atrial fibrillation ([place]) 02/09/2020   Long term (current) use of anticoagulants 02/09/2020   Acute blood loss anemia 02/09/2020   Debility 02/02/2020   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/2020   Severe mitral regurgitation    Mitral regurgitation due to cusp prolapse    Ankle swelling, left 10/20/2019   Ankle swelling, right 10/20/2019   Secondary hypercoagulable state ([place]) 08/19/2019   Left inguinal hernia 03/11/2019   Visit for monitoring Tikosyn therapy 03/29/2018   Osteoporosis 09/23/2017   Fatigue 02/18/2017   Physical exam 04/21/2016    Positional lightheadedness 11/16/2015   B12 deficiency 10/01/2015   Hearing loss due to cerumen impaction 08/23/2015   Foot pain, right 05/23/2015   Hyperlipidemia 04/19/2015   Protein-calorie malnutrition ([place]) 10/19/2014   Fracture of multiple ribs 08/07/2014   Tachycardia    Shortness of breath 08/06/2014   Constipation 07/14/2014   Vitamin B12 deficiency anemia, unspecified 06/19/2014   Hypokalemia 06/19/2014   Other chronic pain 06/19/2014   Polyneuropathy, unspecified 06/19/2014   Zoster without complications 25/85/2778   Vitreous degeneration, bilateral 06/19/2014   Nutritional deficiency, unspecified 06/19/2014   Nocturia 24/23/5361   Other symbolic dysfunctions 44/31/5400   Unsteadiness on feet 06/19/2014   Unspecified osteoarthritis, unspecified site 06/19/2014   Muscle weakness (generalized) 06/19/2014   Major depressive disorder, single episode, unspecified 06/19/2014   Frequency of micturition 06/19/2014   Encounter for orthopedic aftercare following scoliosis surgery 06/19/2014   Cardiac arrhythmia, unspecified 06/19/2014   Age-related nuclear cataract, unspecified eye 06/19/2014   Scoliosis, unspecified 06/19/2014   Lumbar back pain 05/19/2012   Ureteric stone 02/18/2012   Leg pain, bilateral 11/17/2011  Hereditary and idiopathic peripheral neuropathy 11/06/2011   S/P cervical spinal fusion 11/06/2011   Gastro-esophageal reflux disease without esophagitis 11/06/2011   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 11/06/2011   Personal history of other diseases of the circulatory system 11/06/2011   Allergic rhinitis, unspecified 04/25/2010   Vulvodynia, 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   Anxiety and depression 03/04/2010   Other malaise and fatigue 02/05/2010   Vitamin D deficiency 01/28/2010   Neoplasm of uncertain behavior of skin 01/25/2010   Genital herpes 11/01/2009   Herpes simplex virus (HSV) infection 11/01/2009   Herpetic vulvovaginitis 10/23/2009    Kidney stone 08/09/2009   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   Brachial neuritis or radiculitis 07/23/2009    Immunization History  Administered Date(s) Administered   DTaP 07/07/2009   Influenza Inj Mdck Quad Pf 03/25/2019   Influenza Split 03/07/2012   Influenza, High Dose Seasonal PF 05/02/2016   Influenza,inj,Quad PF,6+ Mos 06/08/2013, 04/06/2015, 05/02/2017, 05/08/2018   Influenza-Unspecified 04/26/2021   PFIZER(Purple Top)SARS-COV-2 Vaccination 08/11/2019, 09/01/2019   Pneumococcal Conjugate-13 04/19/2015   Pneumococcal Polysaccharide-23 08/07/2009, 12/27/2013   Tdap 08/02/2020   Zoster Recombinat (Shingrix) 07/08/2010   Zoster, Live 07/07/2010    Conditions to be addressed/monitored:  Afib, HTN, GERD, Osteoporosis, Depression/Anxiety, HLD,   There are no care plans that you recently modified to display for this patient.    Medication Assistance: {MEDASSISTANCEINFO:25044}  Compliance/Adherence/Medication fill history: Care Gaps: ***  Star-Rating Drugs: ***  Patient's preferred pharmacy is:  [facility] [address], [place] [address] Phone: [phone] Fax: [phone]  [facility] [place], [place] [address] Phone: [phone] Fax: [phone]  Uses pill box? {Yes or If no, why not?:20788} Pt endorses ***% compliance  We discussed: {Pharmacy options:24294} Patient decided to: {US Pharmacy Plan:23885}  Care Plan and Follow Up Patient Decision:  {FOLLOWUP:24991}  Plan: {CM FOLLOW UP CLEX:51700}  ***   Current Barriers:  {pharmacybarriers:24917}  Pharmacist Clinical Goal(s):  Patient will {PHARMACYGOALCHOICES:24921} through collaboration with PharmD and provider.   Interventions: 1:1 collaboration with [name], MD regarding development and update of  comprehensive plan of care as evidenced by provider attestation and co-signature Inter-disciplinary care team collaboration (see longitudinal plan of care) Comprehensive medication review performed; medication list updated in electronic medical record  Hypertension (BP goal {CHL HP UPSTREAM Pharmacist BP [phone]}) -{US controlled/uncontrolled:25276} -Current treatment: *** -Medications previously tried: ***  -Current home readings: *** -Current dietary habits: *** -Current exercise habits: *** -{ACTIONS;DENIES/REPORTS:21021675::"Denies"} hypotensive/hypertensive symptoms -Educated on {CCM BP Counseling:25124} -Counseled to monitor BP at home ***, document, and provide log at future appointments -{CCMPHARMDINTERVENTION:25122}  Hyperlipidemia: (LDL goal < ***) -{US controlled/uncontrolled:25276} -Current treatment: *** -Medications previously tried: ***  -Current dietary patterns: *** -Current exercise habits: *** -Educated on {CCM HLD Counseling:25126} -{CCMPHARMDINTERVENTION:25122}  Atrial Fibrillation (Goal: prevent stroke and major bleeding) -{US controlled/uncontrolled:25276} -CHADSVASC: *** -Current treatment: Rate control: *** Anticoagulation: *** -Medications previously tried: *** -Home BP and HR readings: ***  -Counseled on {CCMAFIBCOUNSELING:25120} -{CCMPHARMDINTERVENTION:25122}  Depression/Anxiety (Goal: ***) -{US controlled/uncontrolled:25276} -Current treatment: *** -Medications previously tried/failed: *** -PHQ9: *** -GAD7: *** -Connected with *** for mental health support -Educated on {CCM mental health counseling:25127} -{CCMPHARMDINTERVENTION:25122}  Osteoporosis / Osteopenia (Goal ***) -{US controlled/uncontrolled:25276} -Last DEXA Scan: ***   T-Score femoral neck: ***  T-Score total hip: ***  T-Score lumbar spine: ***  T-Score forearm radius: ***  10-year probability of major osteoporotic  fracture: ***  10-year probability of hip  fracture: *** -Patient {is;is not an osteoporosis candidate:23886} -Current treatment  *** -Medications previously tried: ***  -{Osteoporosis Counseling:23892} -{CCMPHARMDINTERVENTION:25122}  GERD (Goal: ***) -{US controlled/uncontrolled:25276} -Current treatment  *** -Medications previously tried: ***  -{CCMPHARMDINTERVENTION:25122}  Patient Goals/Self-Care Activities Patient will:  - {pharmacypatientgoals:24919}  Follow Up Plan: {CM FOLLOW UP OBSJ:62836}

## 2021-08-13 NOTE — Patient Instructions (Signed)
Ms. Furnari -   Good to see you!  I have no reason to think that you will face any complications from [employer].   We will give you augmentin to take twice daily for one week to treat any bacteria that have taken advantage of the inflammation caused by COVID.  I want you to continue tylenol, warm tea with honey, resting, and hydration.   Get three meals and snacks throughout the day to give your body the tools to fight off the infection.  Call me or Dr. [name] if things get worse or fail to improve   Thank you  Rich

## 2021-08-13 NOTE — Progress Notes (Signed)
Established Patient Office Visit  Subjective:  Patient ID: [name], female    DOB: [birth year]  Age: 76 y.o. MRN: [id number]  CC:  Chief Complaint  Patient presents with   Covid Positive    Pt reports nausea, back and abdominal pain, sore throat mostly resolved, fatigue, body aches, SOB    HPI [name] presents for COVID+  Tested positive on 08/08/21 Sore throat started in late January - around 10 days ago.  She was seen at [facility] where she tested positive for covid, negative for flu. She was recommended to take OTC medications - they did not provide rx for pt. COVID risk of complications at 4.   She has ongoing aches, sore throat, fatigue, body aches, shortness of breath Aches in abdomen and back, aches in legs.  Has has some nausea.   Denies vomiting, diarrhea, urinary symptoms,   Has been taking tylenol.  She did get two vaccines.   Past Medical History:  Diagnosis Date   Allergies    Anxiety    Arthritis    "maybe in my back" (03/31/2018)   Benign paroxysmal positional vertigo 95/18/8416   Complication of anesthesia    Fracture of multiple ribs 2015   "don't know from what; dx'd when I in hospital for 1st back OR" (03/31/2018)   GERD (gastroesophageal reflux disease)    Hair loss 04/12/2012   Herpes    History of blood transfusion    "twice; related to back OR" (03/31/2018)   History of kidney stones    Interstitial cystitis 11/06/2011   Melanoma of ankle ([place]) ~ 2003   "right"   Mitral regurgitation    Osteopenia 02/18/2012   Osteoporosis    PAF (paroxysmal atrial fibrillation) ([place]) 2012   Peripheral neuropathy 11/06/2011   PMDD (premenstrual dysphoric disorder)    PONV (postoperative nausea and vomiting)    nausea, vomiting, hives and dizziness    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/2020   Complete bilateral atrial lesion set using cryothermy and bipolar radiofrequency ablation with clipping of LA appendage via right mini-thoracotomy  approach   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   Complex valvuloplasty including artificial Gore-tex neochord placement x12 with 107m Sorin Memo 4D ring annuloplasty   Seasonal allergies    Vaginal delivery    ONE NSVD   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    Past Surgical History:  Procedure Laterality Date   ANTERIOR CERVICAL DECOMP/DISCECTOMY FUSION  ~ 2003   BACK SURGERY     BREAST SURGERY     BREAST BIOPSY--RIGHT BENIGN   BUNIONECTOMY Bilateral    CARDIOVERSION N/A 01/26/2020   Procedure: CARDIOVERSION;  Surgeon: OGeralynn Rile MD;  [facility] [facility]  Service: Cardiovascular;  Laterality: N/A;   COSMETIC SURGERY  2016   "back of my neck; related to earlier fusion"   [place] times"   [place]    "removed bone protruding out of my forehead"   HARDWARE REMOVAL  2016   "related to neck OR"   INCONTINENCE SURGERY     IR THORACENTESIS ASP PLEURAL SPACE W/IMG GUIDE  02/16/2020   MINIMALLY INVASIVE MAZE PROCEDURE N/A 01/17/2020   Procedure: MINIMALLY INVASIVE MAZE PROCEDURE;  Surgeon: ORexene Alberts MD;  Location: [facility]  Service: Open Heart Surgery;  Laterality: N/A;   MITRAL VALVE REPAIR Right 01/17/2020   Procedure: MINIMALLY INVASIVE MITRAL VALVE REPAIR (MVR) USING MEMO 4D  32MM;  Surgeon: Rexene Alberts, MD;  Location: [place];  Service: Open Heart Surgery;  Laterality: Right;   POSTERIOR CERVICAL FUSION/FORAMINOTOMY  ~ 2008; 2015   RIGHT/LEFT HEART CATH AND CORONARY ANGIOGRAPHY N/A 12/02/2019   Procedure: RIGHT/LEFT HEART CATH AND CORONARY ANGIOGRAPHY;  Surgeon: Burnell Blanks, MD;  Location: [place] [facility];  Service: Cardiovascular;  Laterality: N/A;   SHOULDER ARTHROSCOPY W/ ROTATOR CUFF REPAIR Right 2012   SPINAL FUSION  06/2014 - 2018 X ?7   "scoliosis; my entire back"   TEE WITHOUT CARDIOVERSION N/A 11/21/2019   Procedure: TRANSESOPHAGEAL ECHOCARDIOGRAM (TEE);  Surgeon:  Josue Hector, MD;  Location: [facility] ENDOSCOPY;  Service: Cardiovascular;  Laterality: N/A;   TEE WITHOUT CARDIOVERSION N/A 01/17/2020   Procedure: TRANSESOPHAGEAL ECHOCARDIOGRAM (TEE);  Surgeon: Rexene Alberts, MD;  Location: [place];  Service: Open Heart Surgery;  Laterality: N/A;   TEE WITHOUT CARDIOVERSION N/A 01/26/2020   Procedure: TRANSESOPHAGEAL ECHOCARDIOGRAM (TEE);  Surgeon: Geralynn Rile, MD;  Location: [place];  Service: Cardiovascular;  Laterality: N/A;   TUBAL LIGATION     VAGINAL HYSTERECTOMY     TVH    Family History  Problem Relation Age of Onset   Diabetes Father    Hyperlipidemia Sister    Heart disease Sister    Stroke Sister    Diabetes Brother    Hyperlipidemia Sister    Heart disease Sister    Arthritis Mother    Heart disease Mother    Uterine cancer Mother    Diabetes Brother    Heart Problems Brother     Social History   Socioeconomic History   Marital status: Divorced    Spouse name: none/divorced   Number of children: Not on file   Years of education: Not on file   Highest education level: 12th grade  Occupational History   Occupation: retired  Tobacco Use   Smoking status: Former    Packs/day: 0.10    Years: 14.00    Pack years: 1.40    Types: Cigarettes    Quit date: 1980    Years since quitting: 43.1   Smokeless tobacco: Never   Tobacco comments:    03/31/2018 "quit ~ 1980; someday smoker when I did smoke; never addicted"  Vaping Use   Vaping Use: Never used  Substance and Sexual Activity   Alcohol use: Never   Drug use: Yes    Types: Oxycodone, Benzodiazepines    Comment: 03/31/2018 "for chronic neck and back pain", takes Klonopin at times.    Sexual activity: Not Currently  Other Topics Concern   Not on file  Social History Narrative   Lives by herself.    Social Determinants of Health   Financial Resource Strain: Not on file  Food Insecurity: Not on file  Transportation Needs: Not on file  Physical Activity: Not  on file  Stress: Not on file  Social Connections: Not on file  Intimate Partner Violence: Not on file    Outpatient Medications Prior to Visit  Medication Sig Dispense Refill   acetaminophen (TYLENOL) 500 MG tablet Take 500 mg by mouth every 6 (six) hours as needed for moderate pain.      b complex vitamins tablet Take 1 tablet by mouth daily.     Calcium Carbonate Antacid (TUMS CHEWY BITES PO) Take 1 tablet by mouth daily as needed (reflux).      Calcium Citrate-Vitamin D (CALCIUM + D PO) Take 1 tablet by mouth daily.  Carboxymethylcellul-Glycerin (LUBRICATING EYE DROPS OP) Place 1 drop into both eyes daily as needed (dry eyes).     clonazePAM (KLONOPIN) 0.5 MG tablet Take 1 tablet by mouth twice daily as needed for anxiety 30 tablet 3   cyclobenzaprine (FLEXERIL) 10 MG tablet Take 10 mg by mouth at bedtime as needed for muscle spasms.   5   denosumab (PROLIA) 60 MG/ML SOSY injection Inject 60 mg into the skin every 6 (six) months.     furosemide (LASIX) 20 MG tablet Take 1 tablet (20 mg total) by mouth daily. 90 tablet 3   levothyroxine (SYNTHROID) 75 MCG tablet Take 1 tablet by mouth once daily 90 tablet 0   meclizine (ANTIVERT) 25 MG tablet TAKE 1 TABLET BY MOUTH THREE TIMES DAILY AS NEEDED FOR DIZZINESS 30 tablet 0   metoprolol tartrate (LOPRESSOR) 50 MG tablet Take 1 tablet (50 mg total) by mouth 2 (two) times daily. 180 tablet 3   Nutritional Supplements (,FEEDING SUPPLEMENT, PROSOURCE PLUS) liquid Take 30 mLs by mouth 2 (two) times daily between meals. 887 mL 0   nystatin (MYCOSTATIN/NYSTOP) powder Apply topically 2 (two) times daily as needed (skin irritation (under breasts)). 15 g 0   ondansetron (ZOFRAN) 4 MG tablet Take 1 tablet (4 mg total) by mouth every 8 (eight) hours as needed. 30 tablet 0   oxyCODONE (ROXICODONE) 5 MG/5ML solution Take 4 mLs (4 mg total) by mouth every 4 (four) hours as needed for moderate pain.  0   polyethylene glycol (MIRALAX / GLYCOLAX) packet Take 17  g by mouth daily as needed for mild constipation.     rivaroxaban (XARELTO) 20 MG TABS tablet Take 1 tablet (20 mg total) by mouth daily with supper. 30 tablet 6   sodium chloride (OCEAN) 0.65 % SOLN nasal spray Place 1 spray into both nostrils as needed for congestion (nose bleeds).     triamcinolone ointment (KENALOG) 0.1 % Apply 1 application topically 2 (two) times daily. 90 g 1   valACYclovir (VALTREX) 500 MG tablet Take 500 mg by mouth daily as needed (breakouts).      amLODipine (NORVASC) 5 MG tablet Take 1 tablet by mouth once daily (Patient not taking: Reported on 07/16/2021) 90 tablet 0   sertraline (ZOLOFT) 25 MG tablet Take 1 tablet (25 mg total) by mouth daily. (Patient not taking: Reported on 07/16/2021) 30 tablet 3   No facility-administered medications prior to visit.    Allergies  Allergen Reactions   Buprenorphine Nausea Only and Other (See Comments)    sedation and adhesive reaction Other reaction(s): nausea, sedation and adhesive reaction   Cymbalta [Duloxetine Hcl]     Severe diarrhea and upset stomach   Erythromycin Nausea And Vomiting    Dizziness    Hydrocodone Other (See Comments)    Sedation,dizziness, and nausea Other reaction(s): sedation and nausea Other reaction(s): sedation and nausea   Hydromorphone Other (See Comments)    Sedation,dizziness, and nausea Other reaction(s): sedation and nausea Other reaction(s): sedation and nausea   Iodinated Contrast Media Hives and Nausea Only   Iodine     unknown   Levofloxacin Nausea And Vomiting   Metrizamide Hives   Nsaids     CANNOT TAKE PER CARDIOLOGIST DUE TO AFIB    Nucynta [Tapentadol Hcl]     nausea and sedation   Nucynta [Tapentadol] Nausea Only   Other Other (See Comments)    CAN NOT TAKE , aLPRAZOLAM, OR ELAVIL DUE TO AFIB FOR ANXIETY  IV CONTRAST /"DYE".  Other reaction(s): hives and sores   Oxycodone Other (See Comments)    Delusions (intolerance)  PILLS ONLY sedation, dizziness, nausea,  and  itching Other reaction(s): sedation and nausea Other reaction(s): sedation and nausea   Pentazocine Other (See Comments)    Unknown Other reaction(s): Unknown Other reaction(s): Unknown   Septra [Bactrim]     Hives    Sulfasalazine Hives   Adhesive [Tape] Rash and Other (See Comments)    Heart monitor stickers must be rotated in order to prevent rash   Latex Palpitations and Other (See Comments)     hives and sores Other reaction(s): hives and sores   Morphine Anxiety and Other (See Comments)    sedation and nausea Other reaction(s): sedation and nausea Other reaction(s): sedation and nausea   Sulfa Antibiotics Hives, Nausea Only and Rash    rash    ROS Review of Systems  Constitutional:  Positive for fatigue.  HENT:  Positive for sinus pressure and sore throat.   Eyes: Negative.   Respiratory: Negative.    Cardiovascular: Negative.   Gastrointestinal: Negative.   Genitourinary: Negative.   Musculoskeletal: Negative.   Skin: Negative.   Neurological: Negative.   Psychiatric/Behavioral: Negative.    All other systems reviewed and are negative.    Objective:    Physical Exam Vitals and nursing note reviewed.  Constitutional:      General: She is not in acute distress.    Appearance: Normal appearance. She is normal weight. She is not ill-appearing, toxic-appearing or diaphoretic.  Cardiovascular:     Rate and Rhythm: Normal rate and regular rhythm.     Heart sounds: Normal heart sounds. No murmur heard.   No friction rub. No gallop.  Pulmonary:     Effort: Pulmonary effort is normal. No respiratory distress.     Breath sounds: Normal breath sounds. No stridor. No wheezing, rhonchi or rales.  Chest:     Chest wall: No tenderness.  Skin:    General: Skin is warm and dry.  Neurological:     General: No focal deficit present.     Mental Status: She is alert and oriented to person, place, and time. Mental status is at baseline.  Psychiatric:        Mood and  Affect: Mood normal.        Behavior: Behavior normal.        Thought Content: Thought content normal.        Judgment: Judgment normal.    BP 124/66    Pulse 87    Temp 97.8 F (36.6 C) (Temporal)    Resp 17    Ht '5\' 6"'  (1.676 m)    Wt 116 lb 6.4 oz (52.8 kg)    SpO2 95%    BMI 18.79 kg/m  Wt Readings from Last 3 Encounters:  08/13/21 116 lb 6.4 oz (52.8 kg)  08/01/21 119 lb (54 kg)  07/16/21 118 lb (53.5 kg)     Health Maintenance Due  Topic Date Due   Zoster Vaccines- Shingrix (2 of 2) 09/02/2010   COVID-19 Vaccine (3 - Pfizer risk series) 09/29/2019    There are no preventive care reminders to display for this patient.  Lab Results  Component Value Date   TSH 0.22 (L) 04/17/2021   Lab Results  Component Value Date   WBC 5.8 12/10/2020   HGB 12.5 12/10/2020   HCT 38.2 12/10/2020   MCV 87.3 12/10/2020   PLT 191.0 12/10/2020   Lab Results  Component  Value Date   NA 142 12/10/2020   K 4.3 12/10/2020   CO2 30 12/10/2020   GLUCOSE 86 12/10/2020   BUN 22 12/10/2020   CREATININE 0.78 12/10/2020   BILITOT 0.4 12/10/2020   ALKPHOS 58 12/10/2020   AST 22 12/10/2020   ALT 17 12/10/2020   PROT 6.9 12/10/2020   ALBUMIN 4.2 12/10/2020   CALCIUM 9.1 12/10/2020   ANIONGAP 9 02/06/2020   EGFR 84 11/14/2020   GFR 73.81 12/10/2020   Lab Results  Component Value Date   CHOL 175 12/10/2020   Lab Results  Component Value Date   HDL 80.40 12/10/2020   Lab Results  Component Value Date   LDLCALC 79 12/10/2020   Lab Results  Component Value Date   TRIG 79.0 12/10/2020   Lab Results  Component Value Date   CHOLHDL 2 12/10/2020   Lab Results  Component Value Date   HGBA1C 5.6 04/16/2020      Assessment & Plan:   Problem List Items Addressed This Visit   None Visit Diagnoses     Sore throat    -  Primary   Relevant Medications   doxycycline (VIBRA-TABS) 100 MG tablet   Other Relevant Orders   CBC   POCT Urinalysis Dipstick   Basic Metabolic Panel  (BMET)   [id number]       Relevant Orders   CBC   POCT Urinalysis Dipstick   Basic Metabolic Panel (BMET)   Acute respiratory infection       Relevant Medications   doxycycline (VIBRA-TABS) 100 MG tablet   Other Relevant Orders   CBC   POCT Urinalysis Dipstick   Basic Metabolic Panel (BMET)       Meds ordered this encounter  Medications   DISCONTD: amoxicillin-clavulanate (AUGMENTIN) 875-125 MG tablet    Sig: Take 1 tablet by mouth 2 (two) times daily.    Dispense:  14 tablet    Refill:  0    Order Specific Question:   Supervising Provider    Answer:   Carlota Raspberry, JEFFREY R [[id number]]   doxycycline (VIBRA-TABS) 100 MG tablet    Sig: Take 1 tablet (100 mg total) by mouth 2 (two) times daily.    Dispense:  14 tablet    Refill:  0    Order Specific Question:   Supervising Provider    Answer:   Carlota Raspberry, JEFFREY R [[id number]]    Follow-up: Return if symptoms worsen or fail to improve.   PLAN Reassuring exam.  Concern for secondary bacterial infection. Will give doxycycline po bid x 1 week. Pt to continue tylenol, ok to use neti pot, warm tea with honey, rest, hydrate. Advised on isolation guidelines - will be outside 10 day window by time of cardiology appt next week.  Return if worsening or failing to improve Labs per pt request Patient encouraged to call clinic with any questions, comments, or concerns.  Maximiano Coss, NP

## 2021-08-14 ENCOUNTER — Encounter: Payer: Self-pay | Admitting: Family Medicine

## 2021-08-19 ENCOUNTER — Telehealth: Payer: Self-pay | Admitting: Pharmacist

## 2021-08-19 ENCOUNTER — Ambulatory Visit (INDEPENDENT_AMBULATORY_CARE_PROVIDER_SITE_OTHER): Payer: Medicare Other | Admitting: Cardiology

## 2021-08-19 ENCOUNTER — Other Ambulatory Visit: Payer: Self-pay

## 2021-08-19 ENCOUNTER — Encounter: Payer: Self-pay | Admitting: Cardiology

## 2021-08-19 ENCOUNTER — Telehealth: Payer: Self-pay

## 2021-08-19 VITALS — BP 132/78 | HR 63 | Ht 66.0 in | Wt 117.8 lb

## 2021-08-19 DIAGNOSIS — I878 Other specified disorders of veins: Secondary | ICD-10-CM | POA: Diagnosis not present

## 2021-08-19 DIAGNOSIS — Z9889 Other specified postprocedural states: Secondary | ICD-10-CM

## 2021-08-19 DIAGNOSIS — I5032 Chronic diastolic (congestive) heart failure: Secondary | ICD-10-CM

## 2021-08-19 DIAGNOSIS — I480 Paroxysmal atrial fibrillation: Secondary | ICD-10-CM

## 2021-08-19 DIAGNOSIS — R0602 Shortness of breath: Secondary | ICD-10-CM | POA: Diagnosis not present

## 2021-08-19 DIAGNOSIS — Z8679 Personal history of other diseases of the circulatory system: Secondary | ICD-10-CM

## 2021-08-19 DIAGNOSIS — R5381 Other malaise: Secondary | ICD-10-CM

## 2021-08-19 MED ORDER — METOPROLOL TARTRATE 25 MG PO TABS: 25.0000 mg | ORAL_TABLET | Freq: Three times a day (TID) | ORAL | 3 refills | Status: DC | PRN

## 2021-08-19 MED ORDER — METOPROLOL SUCCINATE ER 50 MG PO TB24: 50.0000 mg | ORAL_TABLET | Freq: Every day | ORAL | 3 refills | Status: DC

## 2021-08-19 NOTE — Telephone Encounter (Signed)
Caller name:Sabrina Luciana Axe   On DPR? :Yes  Call back [phone]  Provider they see: [name]   Reason for call:Pt come in to see Richard last week with Covid and pt is not improving pt is now nausea she is unable to keep anything down this started over the week end. Congestion still, chills

## 2021-08-19 NOTE — Telephone Encounter (Signed)
No concerns on labs. I want her to monitor her symptoms and proceed to ED if worsening  Thanks,  [name]

## 2021-08-19 NOTE — Progress Notes (Signed)
Chronic Care Management Pharmacy Assistant   Name: [name]  MRN: [id number] DOB: 10-07-1945  [name] is an 76 y.o. year old female who presents for his initial CCM visit with the clinical pharmacist.  Reason for Encounter: Chart Prep for Initial visit with CPP on 08/21/21 @ 11:00am    Recent office visits:  08/13/21 Maximiano Coss, NP - Family Medicine - doxycycline po bid x 1 week. Pt to continue tylenol, ok to use neti pot, warm tea with honey, rest, hydrate. Follow up if no improvement.   08/01/21 [name], MD - Family Medicine - Anxiety - No medication changes. Follow up as scheduled in April.   06/20/21 [name], MD - Anxiety - sertraline (ZOLOFT) 25 MG tablet Take 1 tablet (25 mg total) by mouth daily prescribed. Continue Amlodipine. Follow up in 6 weeks.   05/27/21 Merri Ray, NP - Family Medicine - BP check - Amlodipine 5 mg 1 tablet daily prescribed. Follow up as scheduled.   05/09/21 Merri Ray. NP - Family Medicine - Hypertension - No medication changes. Follow up as scheduled.   05/06/21 Maximiano Coss, NP - Hypertension - amLODipine (NORVASC) 5 MG tablet Take 1 tablet (5 mg total) by mouth daily prescribed. Nurse visit 2-3 weeks.  04/25/21 Candee Furbish, MD - Afib - Cardiology - EKG performed.  No medication changes. Follow up as scheduled.   04/17/21 [name], MD - [place] were ordered. Follow up in 6 months.    Recent consult visits:  08/07/21 [place] - S/P spinal fusion - No medication changes. Follow up in 1 year.  07/16/21 Jadene Pierini, PA-C - Cardiothorac - Wound check - No changes noted. Follow up as scheduled.   04/15/21 Kriste Basque - Chronic pain syndrome - No notes available.   04/03/21 Richmond Campbell - Gastroenterology - Therapeutic opioid constipation - No notes available.   04/01/21 Catalina Pizza - Orthopedic Surgery - Senile Osteoporosis - Labs were ordered. Follow up as scheduled.    03/26/21 Ralene Ok - Unilateral recurrent inguinal hernia - No changes noted. Follow up as scheduled.   03/25/21 Barrington Ellison - SOB - Cardiology - EKG performed. Follow up in 9 months.   03/21/21 Pauline Good - Varicose veins - Vascular surgery -  Continue to wear compression stockings as needed. Follow up with vascular surgery as needed   Hospital visits:  None in previous 6 months  Medications: Outpatient Encounter Medications as of 08/19/2021  Medication Sig   acetaminophen (TYLENOL) 500 MG tablet Take 500 mg by mouth every 6 (six) hours as needed for moderate pain.    amLODipine (NORVASC) 5 MG tablet Take 1 tablet by mouth once daily (Patient not taking: Reported on 07/16/2021)   b complex vitamins tablet Take 1 tablet by mouth daily.   Calcium Carbonate Antacid (TUMS CHEWY BITES PO) Take 1 tablet by mouth daily as needed (reflux).    Calcium Citrate-Vitamin D (CALCIUM + D PO) Take 1 tablet by mouth daily.   Carboxymethylcellul-Glycerin (LUBRICATING EYE DROPS OP) Place 1 drop into both eyes daily as needed (dry eyes).   clonazePAM (KLONOPIN) 0.5 MG tablet Take 1 tablet by mouth twice daily as needed for anxiety   cyclobenzaprine (FLEXERIL) 10 MG tablet Take 10 mg by mouth at bedtime as needed for muscle spasms.    denosumab (PROLIA) 60 MG/ML SOSY injection Inject 60 mg into the skin every 6 (six) months.   doxycycline (VIBRA-TABS) 100 MG tablet  Take 1 tablet (100 mg total) by mouth 2 (two) times daily.   furosemide (LASIX) 20 MG tablet Take 1 tablet (20 mg total) by mouth daily.   levothyroxine (SYNTHROID) 75 MCG tablet Take 1 tablet by mouth once daily   meclizine (ANTIVERT) 25 MG tablet TAKE 1 TABLET BY MOUTH THREE TIMES DAILY AS NEEDED FOR DIZZINESS   metoprolol tartrate (LOPRESSOR) 50 MG tablet Take 1 tablet (50 mg total) by mouth 2 (two) times daily.   Nutritional Supplements (,FEEDING SUPPLEMENT, PROSOURCE PLUS) liquid Take 30 mLs by mouth 2 (two) times daily between  meals.   nystatin (MYCOSTATIN/NYSTOP) powder Apply topically 2 (two) times daily as needed (skin irritation (under breasts)).   ondansetron (ZOFRAN) 4 MG tablet Take 1 tablet (4 mg total) by mouth every 8 (eight) hours as needed.   oxyCODONE (ROXICODONE) 5 MG/5ML solution Take 4 mLs (4 mg total) by mouth every 4 (four) hours as needed for moderate pain.   polyethylene glycol (MIRALAX / GLYCOLAX) packet Take 17 g by mouth daily as needed for mild constipation.   rivaroxaban (XARELTO) 20 MG TABS tablet Take 1 tablet (20 mg total) by mouth daily with supper.   sertraline (ZOLOFT) 25 MG tablet Take 1 tablet (25 mg total) by mouth daily. (Patient not taking: Reported on 07/16/2021)   sodium chloride (OCEAN) 0.65 % SOLN nasal spray Place 1 spray into both nostrils as needed for congestion (nose bleeds).   triamcinolone ointment (KENALOG) 0.1 % Apply 1 application topically 2 (two) times daily.   valACYclovir (VALTREX) 500 MG tablet Take 500 mg by mouth daily as needed (breakouts).    No facility-administered encounter medications on file as of 08/19/2021.     Have you seen any other providers since your last visit? no  Any changes in your medications or health? no  Any side effects from any medications? no  Do you have an symptoms or problems not managed by your medications? no  Any concerns about your health right now? no  Has your provider asked that you check blood pressure, blood sugar, or follow special diet at home? Patient reported she checks her blood pressure periodically.   Do you get any type of exercise on a regular basis? She walks some   Can you think of a goal you would like to reach for your health? Patient stated she could not think of anything currently and was in the middle of getting ready for another appointment and couldn't really think at the moment.   Do you have any problems getting your medications? no  Is there anything that you would like to discuss during the  appointment? Patient could not think of anything at the time of the call.   Please bring medications and supplements to appointment  Patient confirmed appointment date and time.   Care Gaps  AWV: done 09/23/17 Colonoscopy: done 02/05/09 DM Eye Exam: N/A DM Foot Exam: N/A Microalbumin: N/A HbgAIC: done 04/16/20 (5.6) DEXA: done 07/03/20 Mammogram: done 12/19/20   Star Rating Drugs: No Star Rating Drugs Noted.  Future Appointments  Date Time Provider [place]  08/19/2021 11:40 AM Freada Bergeron, MD [facility]  08/21/2021 11:00 AM [facility] CCM PHARMACIST [facility]  10/09/2021  2:00 PM [name], MD [facility] [place], [facility] Clinical Pharmacist Assistant  [phone]

## 2021-08-19 NOTE — Patient Instructions (Signed)
Medication Instructions:  Your physician has recommended you make the following change in your medication:   STOP Lopressor  START Metoprolol Succinate 50 mg taking 1 at bedtime  START Metoprolol Tartrate 25 mg taking 1 tablet three times a day only as needed for palpitations. IF YOUR TOP # OF YOUR BLOOD PRESSURE IS LESS THAN 100, DO NOT TAKE THE METOPROLOL TARTRATE    *If you need a refill on your cardiac medications before your next appointment, please call your pharmacy*   Lab Work: None ordered  If you have labs (blood work) drawn today and your tests are completely normal, you will receive your results only by: MyChart Message (if you have MyChart) OR A paper copy in the mail If you have any lab test that is abnormal or we need to change your treatment, we will call you to review the results.   Testing/Procedures: None ordered   Follow-Up: At [facility], you and your health needs are our priority.  As part of our continuing mission to provide you with exceptional heart care, we have created designated Provider Care Teams.  These Care Teams include your primary Cardiologist (physician) and Advanced Practice Providers (APPs -  Physician Assistants and Nurse Practitioners) who all work together to provide you with the care you need, when you need it.  We recommend signing up for the patient portal called "MyChart".  Sign up information is provided on this After Visit Summary.  MyChart is used to connect with patients for Virtual Visits (Telemedicine).  Patients are able to view lab/test results, encounter notes, upcoming appointments, etc.  Non-urgent messages can be sent to your provider as well.   To learn more about what you can do with MyChart, go to [email or web address].    Your next appointment:   6 month(s)  The format for your next appointment:   In Person  Provider:   Freada Bergeron, MD     Other Instructions

## 2021-08-20 ENCOUNTER — Encounter: Payer: Self-pay | Admitting: Registered Nurse

## 2021-08-20 ENCOUNTER — Ambulatory Visit (INDEPENDENT_AMBULATORY_CARE_PROVIDER_SITE_OTHER): Payer: Medicare Other | Admitting: Registered Nurse

## 2021-08-20 ENCOUNTER — Other Ambulatory Visit: Payer: Self-pay

## 2021-08-20 VITALS — BP 141/60 | HR 82 | Temp 98.2°F | Resp 18 | Ht 66.0 in | Wt 116.0 lb

## 2021-08-20 DIAGNOSIS — R110 Nausea: Secondary | ICD-10-CM

## 2021-08-20 DIAGNOSIS — G4452 New daily persistent headache (NDPH): Secondary | ICD-10-CM | POA: Diagnosis not present

## 2021-08-20 MED ORDER — PROMETHAZINE HCL 25 MG PO TABS: 25.0000 mg | ORAL_TABLET | Freq: Two times a day (BID) | ORAL | 0 refills | Status: DC | PRN

## 2021-08-20 NOTE — Telephone Encounter (Signed)
Pt saw cardiology yesterday (same day as phone call) and none of this was mentioned at that visit.  It is not unusual for congestion to persist and last for a few weeks.  Is she having just nausea or is she vomiting as well?  B/c if this started over the weekend and she is vomiting to the point she can't keep anything down- this definitely would have been something to discuss at her in-office appt yesterday.  I would recommend a visit to review and assess for symptoms so we can make sure we have a clear picture as to what's going on

## 2021-08-20 NOTE — Patient Instructions (Addendum)
Ms. Adajah Cocking to see you! I'm sorry you're not feeling well  We have seen a virus going around that is causing some upset stomach - it seems to be lasting a few days. I can send a medication called promethazine (Phenergan) to your pharmacy to use for relief from nausea.  Given your headaches, I will order a CT scan of your head to [facility] to make sure there's nothing funky going on in your brain. I will let you know how the results look  Continue to stay hydrated! Try to get around 2200 calories of food each day.  Thank you!  Rich     If you have lab work done today you will be contacted with your lab results within the next 2 weeks.  If you have not heard from [place] then please contact us. The fastest way to get your results is to register for My Chart.   IF you received an x-ray today, you will receive an invoice from [facility]. Please contact [facility] at [phone] with questions or concerns regarding your invoice.   IF you received labwork today, you will receive an invoice from [place] [facility]. Please contact [facility] at [phone] with questions or concerns regarding your invoice.   Our billing staff will not be able to assist you with questions regarding bills from these companies.  You will be contacted with the lab results as soon as they are available. The fastest way to get your results is to activate your My Chart account. Instructions are located on the last page of this paperwork. If you have not heard from [place] regarding the results in 2 weeks, please contact this office.

## 2021-08-20 NOTE — Telephone Encounter (Signed)
Patient was seen in the office today to address these concerns.

## 2021-08-20 NOTE — Progress Notes (Signed)
Established Patient Office Visit  Subjective:  Patient ID: [name], female    DOB: 07-Aug-1945  Age: 76 y.o. MRN: [id number]  CC:  Chief Complaint  Patient presents with   Follow-up    Patient states she is feeling worse. Patient states she still having the sore throat , having speech problems, blurred vision. Patient states she is scared because she does not know what to do.    HPI [name] presents for malaise  Has not been feeling well since COVID dx.  Seen on 08/13/21 by me for this Has been seen by cardiology since, stable from that perspective  Notes ongoing sore throat (improving), speech problems and blurred vision.  She has had a lot of nausea ongoing. Forcing herself to eat because her appetite is poor.  Has noted more frequent BM, looser than usual. No vomiting.   Has had headache daily since I last saw her - very concerned about etiology.  Notes baseline visual changes. No other neuro changes. Feels like she has been stumbling over words.  She is very anxious - baseline high anxiety, more so with these symptoms, and wants to know what to do.   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   "right"   Mitral regurgitation    Osteopenia 02/18/2012   Osteoporosis    PAF (paroxysmal atrial fibrillation) ([place]) 2012   Peripheral neuropathy 11/06/2011   PMDD (premenstrual dysphoric disorder)    PONV (postoperative nausea and vomiting)    nausea, vomiting, hives  and dizziness    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/2020   Complete bilateral atrial lesion set using cryothermy and bipolar radiofrequency ablation with clipping of LA appendage via right mini-thoracotomy approach   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   Complex valvuloplasty including artificial Gore-tex neochord placement x12 with 15m Sorin Memo 4D ring annuloplasty   Seasonal allergies    Vaginal delivery    ONE NSVD   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    Past Surgical History:  Procedure Laterality Date   ANTERIOR CERVICAL DECOMP/DISCECTOMY FUSION  ~ 2003   BACK SURGERY     BREAST SURGERY     BREAST BIOPSY--RIGHT BENIGN   BUNIONECTOMY Bilateral    CARDIOVERSION N/A 01/26/2020   Procedure: CARDIOVERSION;  Surgeon: OGeralynn Rile MD;  Location: [facility]  Service: Cardiovascular;  Laterality: N/A;   COSMETIC SURGERY  2016   "back of my neck; related to earlier fusion"   [facility] "several times"   [place]    "removed bone protruding out of my forehead"   HARDWARE REMOVAL  2016   "related to neck OR"   INCONTINENCE SURGERY     IR THORACENTESIS ASP PLEURAL SPACE W/IMG GUIDE  02/16/2020   MINIMALLY INVASIVE MAZE PROCEDURE N/A 01/17/2020   Procedure: MINIMALLY  INVASIVE MAZE PROCEDURE;  Surgeon: Rexene Alberts, MD;  Location: [facility];  Service: Open Heart Surgery;  Laterality: N/A;   MITRAL VALVE REPAIR Right 01/17/2020   Procedure: MINIMALLY INVASIVE MITRAL VALVE REPAIR (MVR) USING MEMO 4D 32MM;  Surgeon: Rexene Alberts, MD;  Location: [facility];  Service: Open Heart Surgery;  Laterality: Right;   POSTERIOR CERVICAL FUSION/FORAMINOTOMY  ~ 2008; 2015   RIGHT/LEFT HEART CATH AND CORONARY ANGIOGRAPHY N/A 12/02/2019   Procedure: RIGHT/LEFT HEART CATH AND CORONARY ANGIOGRAPHY;  Surgeon: Burnell Blanks, MD;  Location: [facility];  Service: Cardiovascular;  Laterality: N/A;    SHOULDER ARTHROSCOPY W/ ROTATOR CUFF REPAIR Right 2012   SPINAL FUSION  06/2014 - 2018 X ?7   "scoliosis; my entire back"   TEE WITHOUT CARDIOVERSION N/A 11/21/2019   Procedure: TRANSESOPHAGEAL ECHOCARDIOGRAM (TEE);  Surgeon: Josue Hector, MD;  Location: [facility];  Service: Cardiovascular;  Laterality: N/A;   TEE WITHOUT CARDIOVERSION N/A 01/17/2020   Procedure: TRANSESOPHAGEAL ECHOCARDIOGRAM (TEE);  Surgeon: Rexene Alberts, MD;  Location: [facility];  Service: Open Heart Surgery;  Laterality: N/A;   TEE WITHOUT CARDIOVERSION N/A 01/26/2020   Procedure: TRANSESOPHAGEAL ECHOCARDIOGRAM (TEE);  Surgeon: Geralynn Rile, MD;  Location: [facility];  Service: Cardiovascular;  Laterality: N/A;   TUBAL LIGATION     VAGINAL HYSTERECTOMY     TVH    Family History  Problem Relation Age of Onset   Diabetes Father    Hyperlipidemia Sister    Heart disease Sister    Stroke Sister    Diabetes Brother    Hyperlipidemia Sister    Heart disease Sister    Arthritis Mother    Heart disease Mother    Uterine cancer Mother    Diabetes Brother    Heart Problems Brother     Social History   Socioeconomic History   Marital status: Divorced    Spouse name: none/divorced   Number of children: Not on file   Years of education: Not on file   Highest education level: 12th grade  Occupational History   Occupation: retired  Tobacco Use   Smoking status: Former    Packs/day: 0.10    Years: 14.00    Pack years: 1.40    Types: Cigarettes    Quit date: 1980    Years since quitting: 43.1   Smokeless tobacco: Never   Tobacco comments:    03/31/2018 "quit ~ 1980; someday smoker when I did smoke; never addicted"  Vaping Use   Vaping Use: Never used  Substance and Sexual Activity   Alcohol use: Never   Drug use: Yes    Types: Oxycodone, Benzodiazepines    Comment: 03/31/2018 "for chronic neck and back pain", takes Klonopin at times.    Sexual activity: Not Currently  Other Topics Concern    Not on file  Social History Narrative   Lives by herself.    Social Determinants of Health   Financial Resource Strain: Not on file  Food Insecurity: Not on file  Transportation Needs: Not on file  Physical Activity: Not on file  Stress: Not on file  Social Connections: Not on file  Intimate Partner Violence: Not on file    Outpatient Medications Prior to Visit  Medication Sig Dispense Refill   acetaminophen (TYLENOL) 500 MG tablet Take 500 mg by mouth every 6 (six) hours as needed for moderate pain.      b complex vitamins tablet Take 1 tablet by mouth daily.  Calcium Carbonate Antacid (TUMS CHEWY BITES PO) Take 1 tablet by mouth daily as needed (reflux).      Calcium Citrate-Vitamin D (CALCIUM + D PO) Take 1 tablet by mouth daily.     Carboxymethylcellul-Glycerin (LUBRICATING EYE DROPS OP) Place 1 drop into both eyes daily as needed (dry eyes).     clonazePAM (KLONOPIN) 0.5 MG tablet Take 1 tablet by mouth twice daily as needed for anxiety 30 tablet 3   cyclobenzaprine (FLEXERIL) 10 MG tablet Take 10 mg by mouth at bedtime as needed for muscle spasms.   5   denosumab (PROLIA) 60 MG/ML SOSY injection Inject 60 mg into the skin every 6 (six) months.     doxycycline (VIBRA-TABS) 100 MG tablet Take 1 tablet (100 mg total) by mouth 2 (two) times daily. 14 tablet 0   furosemide (LASIX) 20 MG tablet Take 1 tablet (20 mg total) by mouth daily. 90 tablet 3   levothyroxine (SYNTHROID) 75 MCG tablet Take 1 tablet by mouth once daily 90 tablet 0   meclizine (ANTIVERT) 25 MG tablet TAKE 1 TABLET BY MOUTH THREE TIMES DAILY AS NEEDED FOR DIZZINESS 30 tablet 0   metoprolol succinate (TOPROL-XL) 50 MG 24 hr tablet Take 1 tablet (50 mg total) by mouth at bedtime. Take with or immediately following a meal. 90 tablet 3   metoprolol tartrate (LOPRESSOR) 25 MG tablet Take 1 tablet (25 mg total) by mouth 3 (three) times daily as needed (PALPITATINS). 180 tablet 3   Nutritional Supplements (,FEEDING  SUPPLEMENT, PROSOURCE PLUS) liquid Take 30 mLs by mouth 2 (two) times daily between meals. 887 mL 0   nystatin (MYCOSTATIN/NYSTOP) powder Apply topically 2 (two) times daily as needed (skin irritation (under breasts)). 15 g 0   ondansetron (ZOFRAN) 4 MG tablet Take 1 tablet (4 mg total) by mouth every 8 (eight) hours as needed. 30 tablet 0   oxyCODONE (ROXICODONE) 5 MG/5ML solution Take 4 mLs (4 mg total) by mouth every 4 (four) hours as needed for moderate pain.  0   polyethylene glycol (MIRALAX / GLYCOLAX) packet Take 17 g by mouth daily as needed for mild constipation.     rivaroxaban (XARELTO) 20 MG TABS tablet Take 1 tablet (20 mg total) by mouth daily with supper. 30 tablet 6   sertraline (ZOLOFT) 25 MG tablet Take 1 tablet (25 mg total) by mouth daily. 30 tablet 3   sodium chloride (OCEAN) 0.65 % SOLN nasal spray Place 1 spray into both nostrils as needed for congestion (nose bleeds).     triamcinolone ointment (KENALOG) 0.1 % Apply 1 application topically 2 (two) times daily. 90 g 1   valACYclovir (VALTREX) 500 MG tablet Take 500 mg by mouth daily as needed (breakouts).      amLODipine (NORVASC) 5 MG tablet Take 1 tablet by mouth once daily (Patient not taking: Reported on 08/19/2021) 90 tablet 0   No facility-administered medications prior to visit.    Allergies  Allergen Reactions   Buprenorphine Nausea Only and Other (See Comments)    sedation and adhesive reaction Other reaction(s): nausea, sedation and adhesive reaction   Cymbalta [Duloxetine Hcl]     Severe diarrhea and upset stomach   Erythromycin Nausea And Vomiting    Dizziness    Hydrocodone Other (See Comments)    Sedation,dizziness, and nausea Other reaction(s): sedation and nausea Other reaction(s): sedation and nausea   Hydromorphone Other (See Comments)    Sedation,dizziness, and nausea Other reaction(s): sedation and nausea Other reaction(s): sedation  and nausea   Iodinated Contrast Media Hives and Nausea Only    Iodine     unknown   Levofloxacin Nausea And Vomiting   Metrizamide Hives   Nsaids     CANNOT TAKE PER CARDIOLOGIST DUE TO AFIB    Nucynta [Tapentadol Hcl]     nausea and sedation   Nucynta [Tapentadol] Nausea Only   Other Other (See Comments)    CAN NOT TAKE , aLPRAZOLAM, OR ELAVIL DUE TO AFIB FOR ANXIETY  IV CONTRAST /"DYE". Other reaction(s): hives and sores   Oxycodone Other (See Comments)    Delusions (intolerance)  PILLS ONLY sedation, dizziness, nausea,  and itching Other reaction(s): sedation and nausea Other reaction(s): sedation and nausea   Pentazocine Other (See Comments)    Unknown Other reaction(s): Unknown Other reaction(s): Unknown   Septra [Bactrim]     Hives    Sulfasalazine Hives   Adhesive [Tape] Rash and Other (See Comments)    Heart monitor stickers must be rotated in order to prevent rash   Latex Palpitations and Other (See Comments)     hives and sores Other reaction(s): hives and sores   Morphine Anxiety and Other (See Comments)    sedation and nausea Other reaction(s): sedation and nausea Other reaction(s): sedation and nausea   Sulfa Antibiotics Hives, Nausea Only and Rash    rash    ROS Review of Systems Per hpi     Objective:    Physical Exam Vitals and nursing note reviewed.  Constitutional:      General: She is not in acute distress.    Appearance: Normal appearance. She is normal weight. She is not ill-appearing, toxic-appearing or diaphoretic.  Cardiovascular:     Rate and Rhythm: Normal rate and regular rhythm.     Heart sounds: Normal heart sounds. No murmur heard.   No friction rub. No gallop.  Pulmonary:     Effort: Pulmonary effort is normal. No respiratory distress.     Breath sounds: Normal breath sounds. No stridor. No wheezing, rhonchi or rales.  Chest:     Chest wall: No tenderness.  Skin:    General: Skin is warm and dry.  Neurological:     General: No focal deficit present.     Mental Status: She is alert  and oriented to person, place, and time. Mental status is at baseline.     Cranial Nerves: No cranial nerve deficit.     Sensory: No sensory deficit.     Motor: No weakness.  Psychiatric:        Mood and Affect: Mood normal.        Behavior: Behavior normal.        Thought Content: Thought content normal.        Judgment: Judgment normal.    BP (!) 141/60    Pulse 82    Temp 98.2 F (36.8 C) (Temporal)    Resp 18    Ht _0  (1.676 m)    Wt 116 lb (52.6 kg)    SpO2 97%    BMI 18.72 kg/m  Wt Readings from Last 3 Encounters:  08/20/21 116 lb (52.6 kg)  08/19/21 117 lb 12.8 oz (53.4 kg)  08/13/21 116 lb 6.4 oz (52.8 kg)     Health Maintenance Due  Topic Date Due   Zoster Vaccines- Shingrix (2 of 2) 09/02/2010   COVID-19 Vaccine (3 - Pfizer risk series) 09/29/2019    There are no preventive care reminders to display for this  patient.  Lab Results  Component Value Date   TSH 0.22 (L) 04/17/2021   Lab Results  Component Value Date   WBC 3.2 (L) 08/13/2021   HGB 13.1 08/13/2021   HCT 40.2 08/13/2021   MCV 89.0 08/13/2021   PLT 121.0 (L) 08/13/2021   Lab Results  Component Value Date   NA 137 08/13/2021   K 4.3 08/13/2021   CO2 35 (H) 08/13/2021   GLUCOSE 92 08/13/2021   BUN 10 08/13/2021   CREATININE 0.60 08/13/2021   BILITOT 0.4 12/10/2020   ALKPHOS 58 12/10/2020   AST 22 12/10/2020   ALT 17 12/10/2020   PROT 6.9 12/10/2020   ALBUMIN 4.2 12/10/2020   CALCIUM 9.0 08/13/2021   ANIONGAP 9 02/06/2020   EGFR 84 11/14/2020   GFR 86.81 08/13/2021   Lab Results  Component Value Date   CHOL 175 12/10/2020   Lab Results  Component Value Date   HDL 80.40 12/10/2020   Lab Results  Component Value Date   LDLCALC 79 12/10/2020   Lab Results  Component Value Date   TRIG 79.0 12/10/2020   Lab Results  Component Value Date   CHOLHDL 2 12/10/2020   Lab Results  Component Value Date   HGBA1C 5.6 04/16/2020      Assessment & Plan:   Problem List Items  Addressed This Visit   None Visit Diagnoses     Nausea without vomiting    -  Primary   Relevant Medications   promethazine (PHENERGAN) 25 MG tablet   New daily persistent headache       Relevant Orders   CT HEAD WO CONTRAST (5MM)       Meds ordered this encounter  Medications   promethazine (PHENERGAN) 25 MG tablet    Sig: Take 1 tablet (25 mg total) by mouth 2 (two) times daily as needed for nausea or vomiting.    Dispense:  20 tablet    Refill:  0    Order Specific Question:   Supervising Provider    Answer:   Carlota Raspberry, JEFFREY R [[id number]]    Follow-up: Return if symptoms worsen or fail to improve.   PLAN No concerning findings on exam CT scan head for new headache in older adult. Phenergan for nausea without vomiting. Encouraged rest, hydration, regular meals Patient encouraged to call clinic with any questions, comments, or concerns.  Maximiano Coss, NP

## 2021-08-20 NOTE — Telephone Encounter (Signed)
Patient has been scheduled for 130 today with Richard

## 2021-08-21 ENCOUNTER — Telehealth: Payer: Medicare Other

## 2021-08-21 ENCOUNTER — Encounter: Payer: Self-pay | Admitting: Registered Nurse

## 2021-08-21 NOTE — Telephone Encounter (Signed)
Patient states she remember you two discussing something about her blood sugar yesterday in her appointment , but couldn't remember that full conversation. Please advise

## 2021-08-22 ENCOUNTER — Other Ambulatory Visit: Payer: Self-pay

## 2021-08-22 ENCOUNTER — Ambulatory Visit (INDEPENDENT_AMBULATORY_CARE_PROVIDER_SITE_OTHER): Payer: Medicare Other

## 2021-08-22 ENCOUNTER — Telehealth: Payer: Self-pay | Admitting: Cardiology

## 2021-08-22 DIAGNOSIS — R519 Headache, unspecified: Secondary | ICD-10-CM | POA: Diagnosis not present

## 2021-08-22 DIAGNOSIS — G4452 New daily persistent headache (NDPH): Secondary | ICD-10-CM | POA: Diagnosis not present

## 2021-08-22 MED ORDER — METOPROLOL TARTRATE 50 MG PO TABS: 50.0000 mg | ORAL_TABLET | Freq: Two times a day (BID) | ORAL | 3 refills | Status: DC

## 2021-08-22 NOTE — Telephone Encounter (Signed)
Spoke with Dr. Johney Frame about pts concerns.  Per Dr. Johney Frame, verbal order given to discontinue the pts Toprol XL and her PRN lopressor, and send a script to her pharmacy for her old regimen of metoprolol tartrate 50 mg po BID.   Advised her to stop Toprol XL and PRN lopressor, and start back taking metoprolol tartrate 50 mg po bid.  Confirmed the pharmacy of choice with the pt.  Pt verbalized understanding and agrees with this plan.

## 2021-08-22 NOTE — Telephone Encounter (Signed)
° °  Pt c/o medication issue:  1. Name of Medication: metoprolol  2. How are you currently taking this medication (dosage and times per day)?   3. Are you having a reaction (difficulty breathing--STAT)?   4. What is your medication issue? Pt said, Dr. Johney Frame changed her metoprolol extended release to 1 tablet a day, she said its not working for her she is having too much palpitations and cant have only 1 tablet a day, she wanted to get back on her old dose, she wanted to get done this week before the weekend if possible

## 2021-08-22 NOTE — Telephone Encounter (Signed)
Pt calling to inform Dr. Johney [name] that she is having palpitations with changed regimen of metoprolol.  Pt was switched to Toprol XL from tartrate at this weeks office visit with Dr. Johney [name].  She states with using long-acting metoprolol, it is causing her to have more palpitations, and would like for Dr. Johney [name] to okay with stopping this medication and switching her back to her old regimen of metoprolol tartrate 50 mg po bid.  Will route this communication to Dr. Johney [name] to further review and advise.  Will follow-up with the pt accordingly thereafter. Pt agreed to this plan.

## 2021-08-26 ENCOUNTER — Telehealth: Payer: Self-pay | Admitting: Pharmacist

## 2021-08-26 ENCOUNTER — Other Ambulatory Visit: Payer: Self-pay

## 2021-08-26 ENCOUNTER — Telehealth: Payer: Self-pay | Admitting: Cardiology

## 2021-08-26 MED ORDER — RIVAROXABAN 20 MG PO TABS: 20.0000 mg | ORAL_TABLET | Freq: Every day | ORAL | 6 refills | Status: DC

## 2021-08-26 NOTE — Telephone Encounter (Signed)
Will route this message to our Prior Auth Nurse and PharmD team, to further assist this pt with this request.

## 2021-08-26 NOTE — Telephone Encounter (Signed)
We don't have her prescription insurance scanned in but assuming it's a Medicare Part D plan, she is essentially stuck with the Xarelto copay (should be ~$45/month on most plans unless she has a beginning of the year deductible to meet). [employer] no longer offers any pt assistance for their Medicare patients. Eliquis has their Medicare program but they only cover copays for patients who have spent 3-4% of their annual income on medications which she would not have done in February. If cost is prohibitive, would likely need to change to warfarin.

## 2021-08-26 NOTE — Progress Notes (Signed)
Chronic Care Management Pharmacy Assistant   Name: LYNLEY KILLILEA  MRN: [id number] DOB: Aug 06, 1945  [name] is an 76 y.o. year old female who presents for his initial CCM visit with the clinical pharmacist.  Reason for Encounter: Chart Prep for Initial Visit with CPP on 08/28/21 @ 2:00PM    Recent office visits:  08/20/21 Maximiano Coss, NP - Family Medicine - Nausea - CT of head ordered. promethazine (PHENERGAN) 25 MG tablet prescribed. Follow up if no improvement.   08/13/21 Maximiano Coss, NP - Family Medicine - Sore throat - Labs were ordered. Doxycycline (VIBRA-TABS) 100 MG tablet prescribed.   08/01/21 [name], MD - Anxiety and Depression - Patient decided not to start Sertraline. Has began counseling. No medications prescribed. Follow up in April as prescribed.   06/20/21 [name], MD - Anxiety and Depression - Sertraline (ZOLOFT) 25 MG tablet Take 1 tablet (25 mg total) by mouth daily prescribed. Continue Amlodipine and Start Sertraline. Follow up in 6 weeks.   05/27/21 Merri Ray, MD - Family Medicine - Hypertension - No changes noted. Follow up as scheduled.   05/09/22 Merri Ray, MD - Hypertension - No medication changes. Follow up as scheduled.   05/06/21 Maximiano Coss, MD - Family Medicine - Hypertension - amLODipine (NORVASC) 5 MG tablet prescribed. Follow up in 3 weeks.   04/17/2021 [name], MD - Anxiety and Depression - Labs were ordered. Follow up in 6 months.    Recent consult visits:  08/19/21 [name], MD - Cardiology - Afib - metoprolol succinate (TOPROL-XL) 50 MG 24 hr tablet and metoprolol tartrate (LOPRESSOR) 25 MG tablet prescribed. STOP Lopressor. START Metoprolol Succinate 50 mg taking 1 at bedtime. START Metoprolol Tartrate 25 mg taking 1 tablet three times a day only as needed for palpitations. Follow up in 6 months.   08/07/21 [place] - S/P Spinal Fusion - No medication changes. Follow up in 1  year.   07/16/21 Jadene Pierini, PA-C - Cardiothoracic - Wound Check - No changes made. Follow up as needed.   06/24/21 Kriste Basque - Postlaminectomy syndrome - No notes available.   04/25/21 Candee Furbish, MD - Cardiology - Afib - EKG performed. Follow up as scheduled.   04/03/21 Richmond Campbell, MD - Gastroenterology - Opioid induced constipation - No notes available.  04/01/21 Betsy Southern - Orthopedic Surgery - hold off on Prolia until she can get in with the dentist and make sure that her tender tooth does not need extracting. Follow up as scheduled.   03/26/21 Ralene Ok - General Surgery - Unilateral recurrent inguinal hernia -  No changes noted. Follow up as needed.   03/25/21 Darius Bump - Cardiology - [facility] - EKG performed. No medication changes. Follow up in 9 months.   03/21/21 Pauline Good - Vascular Surgery - Varicose veins - Continue to wear compression stockings as needed. Follow up with vascular surgery as needed   Hospital visits:  None in previous 6 months  Medications: Outpatient Encounter Medications as of 08/26/2021  Medication Sig   acetaminophen (TYLENOL) 500 MG tablet Take 500 mg by mouth every 6 (six) hours as needed for moderate pain.    amLODipine (NORVASC) 5 MG tablet Take 1 tablet by mouth once daily (Patient not taking: Reported on 08/19/2021)   b complex vitamins tablet Take 1 tablet by mouth daily.   Calcium Carbonate Antacid (TUMS CHEWY BITES PO) Take 1 tablet by mouth daily as needed (reflux).  Calcium Citrate-Vitamin D (CALCIUM + D PO) Take 1 tablet by mouth daily.   Carboxymethylcellul-Glycerin (LUBRICATING EYE DROPS OP) Place 1 drop into both eyes daily as needed (dry eyes).   clonazePAM (KLONOPIN) 0.5 MG tablet Take 1 tablet by mouth twice daily as needed for anxiety   cyclobenzaprine (FLEXERIL) 10 MG tablet Take 10 mg by mouth at bedtime as needed for muscle spasms.    denosumab (PROLIA) 60 MG/ML SOSY injection Inject 60 mg into the skin every 6  (six) months.   doxycycline (VIBRA-TABS) 100 MG tablet Take 1 tablet (100 mg total) by mouth 2 (two) times daily.   furosemide (LASIX) 20 MG tablet Take 1 tablet (20 mg total) by mouth daily.   levothyroxine (SYNTHROID) 75 MCG tablet Take 1 tablet by mouth once daily   meclizine (ANTIVERT) 25 MG tablet TAKE 1 TABLET BY MOUTH THREE TIMES DAILY AS NEEDED FOR DIZZINESS   metoprolol tartrate (LOPRESSOR) 50 MG tablet Take 1 tablet (50 mg total) by mouth 2 (two) times daily.   Nutritional Supplements (,FEEDING SUPPLEMENT, PROSOURCE PLUS) liquid Take 30 mLs by mouth 2 (two) times daily between meals.   nystatin (MYCOSTATIN/NYSTOP) powder Apply topically 2 (two) times daily as needed (skin irritation (under breasts)).   ondansetron (ZOFRAN) 4 MG tablet Take 1 tablet (4 mg total) by mouth every 8 (eight) hours as needed.   oxyCODONE (ROXICODONE) 5 MG/5ML solution Take 4 mLs (4 mg total) by mouth every 4 (four) hours as needed for moderate pain.   polyethylene glycol (MIRALAX / GLYCOLAX) packet Take 17 g by mouth daily as needed for mild constipation.   promethazine (PHENERGAN) 25 MG tablet Take 1 tablet (25 mg total) by mouth 2 (two) times daily as needed for nausea or vomiting.   rivaroxaban (XARELTO) 20 MG TABS tablet Take 1 tablet (20 mg total) by mouth daily with supper.   sertraline (ZOLOFT) 25 MG tablet Take 1 tablet (25 mg total) by mouth daily.   sodium chloride (OCEAN) 0.65 % SOLN nasal spray Place 1 spray into both nostrils as needed for congestion (nose bleeds).   triamcinolone ointment (KENALOG) 0.1 % Apply 1 application topically 2 (two) times daily.   valACYclovir (VALTREX) 500 MG tablet Take 500 mg by mouth daily as needed (breakouts).    No facility-administered encounter medications on file as of 08/26/2021.     Have you seen any other providers since your last visit? yes  Any changes in your medications or health? no  Any side effects from any medications? no  Do you have an  symptoms or problems not managed by your medications? Patient states she was not feeling good today and made a lab appointment for tomorrow with her PCP.   Any concerns about your health right now? no  Has your provider asked that you check blood pressure, blood sugar, or follow special diet at home? yes  Do you get any type of exercise on a regular basis? no  Can you think of a goal you would like to reach for your health? Patient reported she could not think of anything right now. She has a lot of appointments this week and will have to think about it before her appointment   Do you have any problems getting your medications? no  Is there anything that you would like to discuss during the appointment? Patient stated she is having a lab appointment tomorrow as she has not been feeling well this week.   Please bring medications and supplements  to appointment. Patient reported she changed her appt to 09/04/21 @ 2pm. Patient confirmed appointment    Care Gaps  AWV: done 05/07/20 Colonoscopy: done 02/05/09 DM Eye Exam: N/A DM Foot Exam: N/A Microalbumin: N/A HbgAIC: done 04/16/20 (5.6) DEXA: done 07/03/20 Mammogram: done 12/19/20   Star Rating Drugs: No Star Rating Drugs Noted.    Future Appointments  Date Time Provider [place]  08/28/2021 11:15 AM [facility]  09/04/2021 12:00 PM [facility] CCM PHARMACIST [facility]  10/09/2021  2:00 PM [name], MD [facility] [employer], [facility] Clinical Pharmacist Assistant  [phone]

## 2021-08-26 NOTE — Telephone Encounter (Signed)
Sounds like she had a high deductible to pay through. Follow up copay of $100/month seems high though. If she's calling her insurance she should see if Eliquis is a lower Tier than Xarelto is since it would be cheaper then.   That's fine to provide her the Eliquis requirements/pt assistance application (main one that's hard to meet for Medicare patients is the 3% of annual income spent on meds for the year). I don't see any contraindications to Eliquis therapy, she'd qualify for 5mg  BID based on her age, weight, and renal function.

## 2021-08-26 NOTE — Telephone Encounter (Signed)
**Note De-Identified Onofrio Klemp Obfuscation** The pt states that she is upset over the cost of her Xarelto and the fact that she is no longer eligible for pt asst through [employer] because she has Medicare Part D.  She states that she paid $500/30 day supply in January and $100/30 day supply in February for her Xarelto and cannot afford this amount.  Since she has paid her deductible for this year, I advised her that if it is ok with Dr Camnitz/Dr Johney Frame for her to switch to Eliquis that she maybe eligible for their program.  She states that she will consider switching to Eliquis only if she runs out of option to be ale to afford Xarelto.  She states that she is calling her ins company and her pharmacy and is requesting that I get the details on Eliquis asst through [employer] in case she has to switch.  She is aware that we are leaving her 2 weeks of Xarelto 20 mg samples in the front office for her to pick up.

## 2021-08-26 NOTE — Telephone Encounter (Signed)
Pt c/o medication issue:  1. Name of Medication: rivaroxaban (XARELTO) 20 MG TABS tablet  2. How are you currently taking this medication (dosage and times per day)? Take 1 tablet (20 mg total) by mouth daily with supper.  3. Are you having a reaction (difficulty breathing--STAT)? no  4. What is your medication issue? Patient calling in need help with the forms to be fill out to help with the medication. Patient cant afford the medication. She is out of the medication. Please advise

## 2021-08-27 ENCOUNTER — Encounter: Payer: Self-pay | Admitting: Registered Nurse

## 2021-08-27 ENCOUNTER — Other Ambulatory Visit: Payer: Self-pay | Admitting: Registered Nurse

## 2021-08-27 DIAGNOSIS — E039 Hypothyroidism, unspecified: Secondary | ICD-10-CM

## 2021-08-27 DIAGNOSIS — D696 Thrombocytopenia, unspecified: Secondary | ICD-10-CM

## 2021-08-27 MED ORDER — APIXABAN 5 MG PO TABS: 5.0000 mg | ORAL_TABLET | Freq: Two times a day (BID) | ORAL | 3 refills | Status: DC

## 2021-08-27 NOTE — Telephone Encounter (Deleted)
**Note De-Identified [name] Obfuscation** The pt is advised that per [place] with [employer]MS[employer] the income limit for a 1 person household is $40,770.00 and that I provided [place] her 2022 income of $23,173.20 and she advised me that 3% of this amount is $621.99. This is the amount the pt will have to pay out of pocket for her medications in 2023 to be approved for their Eliquis program.  The pt states that she does want to switch from Xarelto to Eliquis and apply for assistance through [employer].  She is aware that we are leaving her 2 weeks of Xarelto samples and a [employer] application for Eliquis in the front office for her to pick up.  She states that she will come to the office with her 2022 proof of income, 2023 OOP RX expense report, and all of her ins cards and plans to complete and leave her BMSPAF application at the office to be given to me and will pick up her Xarelto samples while here.  I have removed Xarelto from her med list and added Eliquis 5 mg BID.  The pt expressed much gratitude for our assistance.

## 2021-08-27 NOTE — Telephone Encounter (Signed)
**Note De-Identified [name] Obfuscation** The pt is advised that per [place] with [employer] the income limit for a 1 person household is $40,770.00 and that I provided [place] her 2022 income of $23,173.20 and she advised me that 3% of this amount is $621.99. This is the amount the pt will have to pay out of pocket for her medications in 2023 to be approved for their Eliquis program.   The pt states that she does want to switch from Xarelto to Eliquis and apply for assistance through [employer].   She is aware that we are leaving her 2 weeks of Xarelto samples and a [employer]PA[employer] application for Eliquis in the front office for her to pick up.   She is also aware that her dose of Eliquis will be 5 mg twice daily per Fuller Canada, RPH.   She states that she will come to the office with her 2022 proof of income, 2023 OOP RX expense report, and all of her ins cards and plans to complete and leave her BMSPAF application at the office to be given to me and will pick up her Xarelto samples while here.   I have removed Xarelto from her med list and added Eliquis 5 mg BID.   The pt expressed much gratitude for our assistance.

## 2021-08-27 NOTE — Telephone Encounter (Deleted)
**Note De-Identified Corvette Orser Obfuscation** The pt is advised that per [place] with [employer] the income limit for a 1 person household is $40,770.00 and that I provided [place] her 2022 income of $23,173.20 and she advised me that 3% of this amount is $621.99. This is the amount the pt will have to pay out of pocket for her medications in 2023 to be approved for their Eliquis program.   The pt states that she does want to switch from Xarelto to Eliquis and apply for assistance through [employer].   She is aware that we are leaving her 2 weeks of Xarelto samples and a [employer] application for Eliquis in the front office for her to pick up.  She is also aware that her dose of Eliquis will be 5 mg twice daily per Fuller [place], RPH.   She states that she will come to the office with her 2022 proof of income, 2023 OOP RX expense report, and all of her ins cards and plans to complete and leave her BMSPAF application at the office to be given to me and will pick up her Xarelto samples while here.   I have removed Xarelto from her med list and added Eliquis 5 mg BID.   The pt expressed much gratitude for our assistance.         Tag Copy   Supple, Harlon Flor, RPH-CPP  Pharmacist Pharmacy Telephone Encounter Signed Creation Time:  08/26/2021  2:18 PM   Signed      Sounds like she had a high deductible to pay through. Follow up copay of $100/month seems high though. If she's calling her insurance she should see if Eliquis is a lower Tier than Xarelto is since it would be cheaper then.    That's fine to provide her the Eliquis requirements/pt assistance application (main one that's hard to meet for Medicare patients is the 3% of annual income spent on meds for the year). I don't see any contraindications to Eliquis therapy, she'd qualify for 5mg  BID based on her age, weight, and renal function.         Addend Delete Tag Copy   Chriselda Leppert, Deliah Boston, LPN Licensed Practical Nurse Cardiology Telephone Encounter Signed Creation Time:  08/26/2021  12:29 PM   Signed      The pt states that she is upset over the cost of her Xarelto and the fact that she is no longer eligible for pt asst through [facility] because she has Medicare Part D.   She states that she paid $500/30 day supply in January and $100/30 day supply in February for her Xarelto and cannot afford this amount.   Since she has paid her deductible for this year, I advised her that if it is ok with Dr Camnitz/Dr Johney Frame for her to switch to Eliquis that she maybe eligible for their program.   She states that she will consider switching to Eliquis only if she runs out of option to be ale to afford Xarelto.   She states that she is calling her ins company and her pharmacy and is requesting that I get the details on Eliquis asst through [employer] in case she has to switch.   She is aware that we are leaving her 2 weeks of Xarelto 20 mg samples in the front office for her to pick up.         Tag Copy   Supple, Harlon Flor, RPH-CPP  Pharmacist Pharmacy Telephone Encounter Signed Creation Time:  08/26/2021 11:20 AM   Signed **Note De-Identified Lucine Bilski Obfuscation** We don't have her prescription insurance scanned in but assuming it's a Medicare Part D plan, she is essentially stuck with the Xarelto copay (should be ~$45/month on most plans unless she has a beginning of the year deductible to meet). [employer] no longer offers any pt assistance for their Medicare patients. Eliquis has their Medicare program but they only cover copays for patients who have spent 3-4% of their annual income on medications which she would not have done in February. If cost is prohibitive, would likely need to change to warfarin.         Tag Copy   Nuala Alpha, LPN Licensed Practical Nurse Telephone Encounter Signed Creation Time:  08/26/2021  9:32 AM   Signed      Will route this message to our Prior Auth Nurse and PharmD team, to further assist this pt with this request.         Tag Copy Nicolasa Ducking A Telephone  Encounter Signed Creation Time:  08/26/2021  9:20 AM   Signed      Pt c/o medication issue:   1. Name of Medication: rivaroxaban (XARELTO) 20 MG TABS tablet   2. How are you currently taking this medication (dosage and times per day)? Take 1 tablet (20 mg total) by mouth daily with supper.   3. Are you having a reaction (difficulty breathing--STAT)? no   4. What is your medication issue? Patient calling in need help with the forms to be fill out to help with the medication. Patient cant afford the medication. She is out of the medication. Please advise

## 2021-08-27 NOTE — Telephone Encounter (Signed)
Richard and Dr.  [name],  I wanted you to know my situation with the covid and the virus has not improved that much I took the antibiotics and I took medicine for the nausea. The pharmacist at [facility] also told me a virus is going around that cause nausea.  My sore throat has improved, I still have some congestion, some headache and still very much nauseated at time.  I am eating a little bit better than I was. I know the scan came back with a a little sinusitis, if I read that right.  Is there anything I could use that wouldn't affect my heart problem for my sinuses? If so would you call it in at [facility] in [place]?  Thank you for any and I'll help.

## 2021-08-28 ENCOUNTER — Telehealth: Payer: Medicare Other

## 2021-08-28 ENCOUNTER — Other Ambulatory Visit (INDEPENDENT_AMBULATORY_CARE_PROVIDER_SITE_OTHER): Payer: Medicare Other

## 2021-08-28 DIAGNOSIS — D696 Thrombocytopenia, unspecified: Secondary | ICD-10-CM | POA: Diagnosis not present

## 2021-08-28 DIAGNOSIS — E039 Hypothyroidism, unspecified: Secondary | ICD-10-CM

## 2021-08-28 LAB — CBC WITH DIFFERENTIAL/PLATELET
Basophils Absolute: 0 10*3/uL (ref 0.0–0.1)
Basophils Relative: 0.6 % (ref 0.0–3.0)
Eosinophils Absolute: 0.1 10*3/uL (ref 0.0–0.7)
Eosinophils Relative: 3.1 % (ref 0.0–5.0)
HCT: 38 % (ref 36.0–46.0)
Hemoglobin: 12.5 g/dL (ref 12.0–15.0)
Lymphocytes Relative: 17.7 % (ref 12.0–46.0)
Lymphs Abs: 0.8 10*3/uL (ref 0.7–4.0)
MCHC: 32.8 g/dL (ref 30.0–36.0)
MCV: 89.1 fl (ref 78.0–100.0)
Monocytes Absolute: 0.5 10*3/uL (ref 0.1–1.0)
Monocytes Relative: 10.6 % (ref 3.0–12.0)
Neutro Abs: 3.2 10*3/uL (ref 1.4–7.7)
Neutrophils Relative %: 68 % (ref 43.0–77.0)
Platelets: 180 10*3/uL (ref 150.0–400.0)
RBC: 4.27 Mil/uL (ref 3.87–5.11)
RDW: 13.7 % (ref 11.5–15.5)
WBC: 4.7 10*3/uL (ref 4.0–10.5)

## 2021-08-28 LAB — TSH: TSH: 0.4 u[IU]/mL (ref 0.35–5.50)

## 2021-08-29 ENCOUNTER — Other Ambulatory Visit: Payer: Medicare Other

## 2021-08-29 LAB — PATHOLOGIST SMEAR REVIEW

## 2021-08-30 NOTE — Telephone Encounter (Signed)
**Note De-Identified Jerick Khachatryan Obfuscation** The pt left her completed [employer] application for Eliquis at the office with documents. I have completed the providers page of her application and have e-mailed it to Dr Jacolyn Reedy nurse so she can obtain her signature, date it, and to fax to [facility] at the fax number written on the cover letter included.

## 2021-08-30 NOTE — Telephone Encounter (Signed)
Pts application forms for Eliquis printed off and placed in Dr. Jacolyn Reedy folder, to be signed when she returns back to the office next week. Once signature obtained, will fax to contact information provided on cover sheet.

## 2021-09-02 ENCOUNTER — Telehealth: Payer: Self-pay | Admitting: Cardiology

## 2021-09-02 DIAGNOSIS — I10 Essential (primary) hypertension: Secondary | ICD-10-CM | POA: Diagnosis not present

## 2021-09-02 DIAGNOSIS — Z87891 Personal history of nicotine dependence: Secondary | ICD-10-CM | POA: Diagnosis not present

## 2021-09-02 DIAGNOSIS — R030 Elevated blood-pressure reading, without diagnosis of hypertension: Secondary | ICD-10-CM | POA: Diagnosis not present

## 2021-09-02 DIAGNOSIS — R457 State of emotional shock and stress, unspecified: Secondary | ICD-10-CM | POA: Diagnosis not present

## 2021-09-02 DIAGNOSIS — Z885 Allergy status to narcotic agent status: Secondary | ICD-10-CM | POA: Diagnosis not present

## 2021-09-02 DIAGNOSIS — Z91040 Latex allergy status: Secondary | ICD-10-CM | POA: Diagnosis not present

## 2021-09-02 DIAGNOSIS — Z79899 Other long term (current) drug therapy: Secondary | ICD-10-CM | POA: Diagnosis not present

## 2021-09-02 DIAGNOSIS — Z91041 Radiographic dye allergy status: Secondary | ICD-10-CM | POA: Diagnosis not present

## 2021-09-02 DIAGNOSIS — R040 Epistaxis: Secondary | ICD-10-CM | POA: Diagnosis not present

## 2021-09-02 DIAGNOSIS — Z881 Allergy status to other antibiotic agents status: Secondary | ICD-10-CM | POA: Diagnosis not present

## 2021-09-02 DIAGNOSIS — Z882 Allergy status to sulfonamides status: Secondary | ICD-10-CM | POA: Diagnosis not present

## 2021-09-02 DIAGNOSIS — Z9109 Other allergy status, other than to drugs and biological substances: Secondary | ICD-10-CM | POA: Diagnosis not present

## 2021-09-02 DIAGNOSIS — Z794 Long term (current) use of insulin: Secondary | ICD-10-CM | POA: Diagnosis not present

## 2021-09-02 NOTE — Telephone Encounter (Signed)
Pt called in to speak with our scheduling dept to make an appt with Dr. Johney Frame for March, to speak with her about medication management and stressful issues going on in her life.  Pt has no cardiac complaints.  Pt states the operator/scheduler Martinique Kamira made the pt an appt to see Dr. Johney Frame for 3/23 at [place] and she would like to keep that appt there.  Pt states she has reached out to her PCP about how she could assist with helping the pt cope with her anxiety and stress related issues, but would like Dr. Jacolyn Reedy input on this, which is why she had the scheduler make her an appt with Dr. Johney Frame.  Advised the pt to reach out to her PCP about anxiety related questions and treatment, and pt became tearful.  Advised the pt that we will keep her appt with Dr. Johney Frame. Pt verbalized understanding and agrees with this plan.

## 2021-09-02 NOTE — Telephone Encounter (Signed)
Application form signed and dated by Dr. Johney Frame.  Given to medical records to fax to contact information provided on cover sheet.  Per Medical Records, waiting on confirmation fax to come through.

## 2021-09-02 NOTE — Telephone Encounter (Signed)
Pt is wanting a telephone visit with Dr. Radford Pax.. would like to know if this is available

## 2021-09-02 NOTE — Telephone Encounter (Signed)
Confirmation received, that faxed application forms went through.  Will make Prior Auth Nurse aware of completion.

## 2021-09-04 ENCOUNTER — Telehealth: Payer: Medicare Other

## 2021-09-04 DIAGNOSIS — H6123 Impacted cerumen, bilateral: Secondary | ICD-10-CM | POA: Diagnosis not present

## 2021-09-04 DIAGNOSIS — R040 Epistaxis: Secondary | ICD-10-CM | POA: Diagnosis not present

## 2021-09-04 NOTE — Progress Notes (Unsigned)
Chronic Care Management Pharmacy Note  09/04/2021 Name:  [name] MRN:  [id number] DOB:  1945/10/28  Summary: ***  Recommendations/Changes made from today's visit: ***  Plan: ***   Subjective: [name] is an 76 y.o. year old female who is a primary patient of Tabori, [name], MD.  The CCM team was consulted for assistance with disease management and care coordination needs.    Engaged with patient by telephone for initial visit in response to provider referral for pharmacy case management and/or care coordination services.   Consent to Services:  The patient was given the following information about Chronic Care Management services today, agreed to services, and gave verbal consent: 1. CCM service includes personalized support from designated clinical staff supervised by the primary care provider, including individualized plan of care and coordination with other care providers 2. 24/7 contact phone numbers for assistance for urgent and routine care needs. 3. Service will only be billed when office clinical staff spend 20 minutes or more in a month to coordinate care. 4. Only one practitioner may furnish and bill the service in a calendar month. 5.The patient may stop CCM services at any time (effective at the end of the month) by phone call to the office staff. 6. The patient will be responsible for cost sharing (co-pay) of up to 20% of the service fee (after annual deductible is met). Patient agreed to services and consent obtained.  Patient Care Team: [name], MD as PCP - General (Family Medicine) Constance Haw, MD as PCP - Electrophysiology (Cardiology) Freada Bergeron, MD as PCP - Cardiology (Cardiology) Domingo Pulse, MD as Consulting Physician (Urology) Richmond Campbell, MD as Consulting Physician (Gastroenterology) Atilano Ina, MD as Referring Physician (Neurosurgery) Terrance Mass, MD (Inactive) as Consulting Physician  (Gynecology) Jacolyn Reedy, MD as Consulting Physician (Cardiology) Kriste Basque, MD as Referring Physician (Pain Medicine) [facility] [place] Rolm Bookbinder, MD as Consulting Physician (Dermatology) Woodle Kerbs, MD as Consulting Physician (Specialist) Rexene Alberts, MD (Inactive) as Consulting Physician (Cardiothoracic Surgery) Melvenia Beam, MD as Consulting Physician (Neurology) [name], DDS as Consulting Physician (Dentistry) Madelin Rear, [facility] as Pharmacist (Pharmacist) Melvenia Beam, MD as Consulting Physician (Neurology) [name], [facility] as Pharmacist (Pharmacist)  Recent office visits:  08/20/21 Maximiano Coss, NP - Family Medicine - Nausea - CT of head ordered. promethazine (PHENERGAN) 25 MG tablet prescribed. Follow up if no improvement.    08/13/21 Maximiano Coss, NP - Family Medicine - Sore throat - Labs were ordered. Doxycycline (VIBRA-TABS) 100 MG tablet prescribed.    08/01/21 [name], MD - Anxiety and Depression - Patient decided not to start Sertraline. Has began counseling. No medications prescribed. Follow up in April as prescribed.    06/20/21 [name], MD - Anxiety and Depression - Sertraline (ZOLOFT) 25 MG tablet Take 1 tablet (25 mg total) by mouth daily prescribed. Continue Amlodipine and Start Sertraline. Follow up in 6 weeks.    05/27/21 Merri Ray, MD - Family Medicine - Hypertension - No changes noted. Follow up as scheduled.    05/09/22 Merri Ray, MD - Hypertension - No medication changes. Follow up as scheduled.    05/06/21 Maximiano Coss, MD - Family Medicine - Hypertension - amLODipine (NORVASC) 5 MG tablet prescribed. Follow up in 3 weeks.    04/17/2021 [name], MD - Anxiety and Depression - Labs were ordered. Follow up in 6 months.      Recent consult  visits:  08/19/21 [name], MD - Cardiology - Afib - metoprolol succinate (TOPROL-XL) 50 MG 24 hr tablet and  metoprolol tartrate (LOPRESSOR) 25 MG tablet prescribed. STOP Lopressor. START Metoprolol Succinate 50 mg taking 1 at bedtime. START Metoprolol Tartrate 25 mg taking 1 tablet three times a day only as needed for palpitations. Follow up in 6 months.    08/07/21 [place] - S/P Spinal Fusion - No medication changes. Follow up in 1 year.    07/16/21 Jadene Pierini, PA-C - Cardiothoracic - Wound Check - No changes made. Follow up as needed.    06/24/21 Kriste Basque - Postlaminectomy syndrome - No notes available.    04/25/21 Candee Furbish, MD - Cardiology - Afib - EKG performed. Follow up as scheduled.    04/03/21 Richmond Campbell, MD - Gastroenterology - Opioid induced constipation - No notes available.   04/01/21 Betsy Southern - Orthopedic Surgery - hold off on Prolia until she can get in with the dentist and make sure that her tender tooth does not need extracting. Follow up as scheduled.    03/26/21 Ralene Ok - General Surgery - Unilateral recurrent inguinal hernia -  No changes noted. Follow up as needed.    03/25/21 Darius Bump - Cardiology - [facility] - EKG performed. No medication changes. Follow up in 9 months.    03/21/21 Pauline Good - Vascular Surgery - Varicose veins - Continue to wear compression stockings as needed. Follow up with vascular surgery as needed     Hospital visits:  None in previous 6 months   Objective:  Lab Results  Component Value Date   CREATININE 0.60 08/13/2021   BUN 10 08/13/2021   GFR 86.81 08/13/2021   EGFR 84 11/14/2020   GFRNONAA >60 02/06/2020   GFRAA >60 02/06/2020   NA 137 08/13/2021   K 4.3 08/13/2021   CALCIUM 9.0 08/13/2021   CO2 35 (H) 08/13/2021   GLUCOSE 92 08/13/2021    Lab Results  Component Value Date/Time   HGBA1C 5.6 04/16/2020 04:09 PM   HGBA1C 5.3 01/13/2020 12:00 PM   GFR 86.81 08/13/2021 11:28 AM   GFR 73.81 12/10/2020 01:38 PM    Last diabetic Eye exam: No results found for: HMDIABEYEEXA  Last diabetic  Foot exam: No results found for: HMDIABFOOTEX   Lab Results  Component Value Date   CHOL 175 12/10/2020   HDL 80.40 12/10/2020   LDLCALC 79 12/10/2020   LDLDIRECT 136.5 06/08/2013   TRIG 79.0 12/10/2020   CHOLHDL 2 12/10/2020    Hepatic Function Latest Ref Rng & Units 12/10/2020 07/11/2020 04/16/2020  Total Protein 6.0 - 8.3 g/dL 6.9 7.4 6.9  Albumin 3.5 - 5.2 g/dL 4.2 4.3 -  AST 0 - 37 U/L 22 30 -  ALT 0 - 35 U/L 17 24 -  Alk Phosphatase 39 - 117 U/L 58 61 -  Total Bilirubin 0.2 - 1.2 mg/dL 0.4 0.4 -  Bilirubin, Direct 0.0 - 0.3 mg/dL 0.1 0.1 -    Lab Results  Component Value Date/Time   TSH 0.40 08/28/2021 10:56 AM   TSH 0.22 (L) 04/17/2021 12:27 PM   FREET4 1.58 04/17/2021 12:27 PM   FREET4 1.34 12/10/2020 01:38 PM    CBC Latest Ref Rng & Units 08/28/2021 08/13/2021 12/10/2020  WBC 4.0 - 10.5 K/uL 4.7 3.2(L) 5.8  Hemoglobin 12.0 - 15.0 g/dL 12.5 13.1 12.5  Hematocrit 36.0 - 46.0 % 38.0 40.2 38.2  Platelets 150.0 - 400.0 K/uL 180.0 121.0(L) 191.0  Lab Results  Component Value Date/Time   VD25OH 27.58 (L) 09/23/2017 02:20 PM   VD25OH 46 10/26/2015 11:40 AM   VD25OH 27 (L) 07/25/2015 12:31 PM    Clinical ASCVD: {YES/NO:21197} The 10-year ASCVD risk score (Arnett DK, et al., 2019) is: 27.2%   Values used to calculate the score:     Age: 27 years     Sex: Female     Is Non-Hispanic African American: No     Diabetic: No     Tobacco smoker: No     Systolic Blood Pressure: 048 mmHg     Is BP treated: Yes     HDL Cholesterol: 80.4 mg/dL     Total Cholesterol: 175 mg/dL    Depression screen [facility] 2/9 08/20/2021 08/13/2021 06/20/2021  Decreased Interest 0 1 1  Down, Depressed, Hopeless 0 1 0  PHQ - 2 Score 0 2 1  Altered sleeping 0 1 1  Tired, decreased energy 0 1 1  Change in appetite 0 1 0  Feeling bad or failure about yourself  0 0 0  Trouble concentrating 0 1 1  Moving slowly or fidgety/restless 0 1 0  Suicidal thoughts 0 0 0  PHQ-9 Score 0 7 4  Difficult doing  work/chores Not difficult at all - Not difficult at all  Some recent data might be hidden     ***Other: (CHADS2VASc if Afib, MMRC or CAT for COPD, ACT, DEXA)  Social History   Tobacco Use  Smoking Status Former   Packs/day: 0.10   Years: 14.00   Pack years: 1.40   Types: Cigarettes   Quit date: 1980   Years since quitting: 43.1  Smokeless Tobacco Never  Tobacco Comments   03/31/2018 "quit ~ 1980; someday smoker when I did smoke; never addicted"   BP Readings from Last 3 Encounters:  08/20/21 (!) 141/60  08/19/21 132/78  08/13/21 124/66   Pulse Readings from Last 3 Encounters:  08/20/21 82  08/19/21 63  08/13/21 87   Wt Readings from Last 3 Encounters:  08/20/21 116 lb (52.6 kg)  08/19/21 117 lb 12.8 oz (53.4 kg)  08/13/21 116 lb 6.4 oz (52.8 kg)   BMI Readings from Last 3 Encounters:  08/20/21 18.72 kg/m  08/19/21 19.01 kg/m  08/13/21 18.79 kg/m    Assessment/Interventions: Review of patient past medical history, allergies, medications, health status, including review of consultants reports, laboratory and other test data, was performed as part of comprehensive evaluation and provision of chronic care management services.   SDOH:  (Social Determinants of Health) assessments and interventions performed: {yes/no:20286}  SDOH Screenings   Alcohol Screen: Not on file  Depression (PHQ2-9): Low Risk    PHQ-2 Score: 0  Financial Resource Strain: Not on file  Food Insecurity: Not on file  Housing: Not on file  Physical Activity: Not on file  Social Connections: Not on file  Stress: Not on file  Tobacco Use: Medium Risk   Smoking Tobacco Use: Former   Smokeless Tobacco Use: Never   Passive Exposure: Not on file  Transportation Needs: Not on file    CCM Care Plan  Allergies  Allergen Reactions   Buprenorphine Nausea Only and Other (See Comments)    sedation and adhesive reaction Other reaction(s): nausea, sedation and adhesive reaction   Cymbalta  [Duloxetine Hcl]     Severe diarrhea and upset stomach   Erythromycin Nausea And Vomiting    Dizziness    Hydrocodone Other (See Comments)    Sedation,dizziness, and  nausea Other reaction(s): sedation and nausea Other reaction(s): sedation and nausea   Hydromorphone Other (See Comments)    Sedation,dizziness, and nausea Other reaction(s): sedation and nausea Other reaction(s): sedation and nausea   Iodinated Contrast Media Hives and Nausea Only   Iodine     unknown   Levofloxacin Nausea And Vomiting   Metrizamide Hives   Nsaids     CANNOT TAKE PER CARDIOLOGIST DUE TO AFIB    Nucynta [Tapentadol Hcl]     nausea and sedation   Nucynta [Tapentadol] Nausea Only   Other Other (See Comments)    CAN NOT TAKE , aLPRAZOLAM, OR ELAVIL DUE TO AFIB FOR ANXIETY  IV CONTRAST /"DYE". Other reaction(s): hives and sores   Oxycodone Other (See Comments)    Delusions (intolerance)  PILLS ONLY sedation, dizziness, nausea,  and itching Other reaction(s): sedation and nausea Other reaction(s): sedation and nausea   Pentazocine Other (See Comments)    Unknown Other reaction(s): Unknown Other reaction(s): Unknown   Septra [Bactrim]     Hives    Sulfasalazine Hives   Adhesive [Tape] Rash and Other (See Comments)    Heart monitor stickers must be rotated in order to prevent rash   Latex Palpitations and Other (See Comments)     hives and sores Other reaction(s): hives and sores   Morphine Anxiety and Other (See Comments)    sedation and nausea Other reaction(s): sedation and nausea Other reaction(s): sedation and nausea   Sulfa Antibiotics Hives, Nausea Only and Rash    rash    Medications Reviewed Today     Reviewed by Maximiano Coss, NP (Nurse Practitioner) on 08/20/21 at 1348  Med List Status: <None>   Medication Order Taking? Sig Documenting Provider Last Dose Status Informant  acetaminophen (TYLENOL) 500 MG tablet [id number] Yes Take 500 mg by mouth every 6 (six) hours as needed  for moderate pain.  [provider] Taking Active Self  amLODipine (NORVASC) 5 MG tablet [id number] No Take 1 tablet by mouth once daily  Patient not taking: Reported on 08/19/2021   Maximiano Coss, NP Not Taking Active   b complex vitamins tablet [id number] Yes Take 1 tablet by mouth daily. [provider] Taking Active Self  Calcium Carbonate Antacid (TUMS CHEWY BITES PO) [id number] Yes Take 1 tablet by mouth daily as needed (reflux).  [provider] Taking Active Self  Calcium Citrate-Vitamin D (CALCIUM + D PO) [id number] Yes Take 1 tablet by mouth daily. [provider] Taking Active   Carboxymethylcellul-Glycerin (LUBRICATING EYE DROPS OP) [id number] Yes Place 1 drop into both eyes daily as needed (dry eyes). [provider] Taking Active Self  clonazePAM (KLONOPIN) 0.5 MG tablet [id number] Yes Take 1 tablet by mouth twice daily as needed for anxiety [name], MD Taking Active   cyclobenzaprine (FLEXERIL) 10 MG tablet [id number] Yes Take 10 mg by mouth at bedtime as needed for muscle spasms.  [provider] Taking Active Self  denosumab (PROLIA) 60 MG/ML SOSY injection [id number] Yes Inject 60 mg into the skin every 6 (six) months. [provider] Taking Active Self           Med Note Shari Heritage Jun 11, 2020  2:33 PM)    doxycycline (VIBRA-TABS) 100 MG tablet [id number] Yes Take 1 tablet (100 mg total) by mouth 2 (two) times daily. Maximiano Coss, NP Taking Active   furosemide (LASIX) 20 MG tablet [id number] Yes Take 1 tablet (20 mg total) by mouth  daily. Freada Bergeron, MD Taking Active   levothyroxine (SYNTHROID) 75 MCG tablet [id number] Yes Take 1 tablet by mouth once daily [name], MD Taking Active   meclizine (ANTIVERT) 25 MG tablet [id number] Yes TAKE 1 TABLET BY MOUTH THREE TIMES DAILY AS NEEDED FOR DIZZINESS [name], MD Taking Active   metoprolol succinate (TOPROL-XL) 50 MG 24 hr  tablet [id number] Yes Take 1 tablet (50 mg total) by mouth at bedtime. Take with or immediately following a meal. Pemberton, Greer Ee, MD Taking Active   metoprolol tartrate (LOPRESSOR) 25 MG tablet [id number] Yes Take 1 tablet (25 mg total) by mouth 3 (three) times daily as needed (PALPITATINS). Freada Bergeron, MD Taking Active   Nutritional Supplements (,FEEDING SUPPLEMENT, PROSOURCE PLUS) liquid [id number] Yes Take 30 mLs by mouth 2 (two) times daily between meals. Bary Leriche, PA-C Taking Active   nystatin (MYCOSTATIN/NYSTOP) powder [id number] Yes Apply topically 2 (two) times daily as needed (skin irritation (under breasts)). Bary Leriche, PA-C Taking Active   ondansetron (ZOFRAN) 4 MG tablet [id number] Yes Take 1 tablet (4 mg total) by mouth every 8 (eight) hours as needed. [name], MD Taking Active   oxyCODONE (ROXICODONE) 5 MG/5ML solution [id number] Yes Take 4 mLs (4 mg total) by mouth every 4 (four) hours as needed for moderate pain. Bary Leriche, PA-C Taking Active            Med Note Shari Heritage Jun 11, 2020  2:33 PM)    polyethylene glycol [facility] / GLYCOLAX) packet [id number] Yes Take 17 g by mouth daily as needed for mild constipation. [provider] Taking Active Self  promethazine (PHENERGAN) 25 MG tablet [id number] Yes Take 1 tablet (25 mg total) by mouth 2 (two) times daily as needed for nausea or vomiting. Maximiano Coss, NP  Active   rivaroxaban (XARELTO) 20 MG TABS tablet [id number] Yes Take 1 tablet (20 mg total) by mouth daily with supper. Freada Bergeron, MD Taking Active   sertraline (ZOLOFT) 25 MG tablet [id number] Yes Take 1 tablet (25 mg total) by mouth daily. [name], MD Taking Active   sodium chloride (OCEAN) 0.65 % SOLN nasal spray [id number] Yes Place 1 spray into both nostrils as needed for congestion (nose bleeds). [provider] Taking Active Self  triamcinolone ointment (KENALOG) 0.1 % [id number] Yes  Apply 1 application topically 2 (two) times daily. [name], MD Taking Active   valACYclovir (VALTREX) 500 MG tablet [id number] Yes Take 500 mg by mouth daily as needed (breakouts).  [provider] Taking Active Self            Patient Active Problem List   Diagnosis Date Noted   HTN (hypertension) 06/20/2021   Visit for wound check 04/29/2021   Therapeutic opioid-induced constipation (OIC) 04/03/2021   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   Atrial fibrillation ([place]) 02/09/2020   Long term (current) use of anticoagulants 02/09/2020   Acute blood loss anemia 02/09/2020   Debility 02/02/2020   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/2020   Severe mitral regurgitation    Mitral regurgitation due to cusp prolapse    Ankle swelling, left 10/20/2019   Ankle swelling, right 10/20/2019   Secondary hypercoagulable state ([place]) 08/19/2019   Left inguinal hernia 03/11/2019   Visit for monitoring Tikosyn therapy 03/29/2018   Osteoporosis 09/23/2017   Fatigue 02/18/2017   Physical exam 04/21/2016   Positional lightheadedness  11/16/2015   B12 deficiency 10/01/2015   Hearing loss due to cerumen impaction 08/23/2015   Foot pain, right 05/23/2015   Hyperlipidemia 04/19/2015   Protein-calorie malnutrition ([place]) 10/19/2014   Fracture of multiple ribs 08/07/2014   Tachycardia    Shortness of breath 08/06/2014   Constipation 07/14/2014   Vitamin B12 deficiency anemia, unspecified 06/19/2014   Hypokalemia 06/19/2014   Other chronic pain 06/19/2014   Polyneuropathy, unspecified 06/19/2014   Zoster without complications 37/04/6268   Vitreous degeneration, bilateral 06/19/2014   Nutritional deficiency, unspecified 06/19/2014   Nocturia 48/54/6270   Other symbolic dysfunctions 35/00/9381   Unsteadiness on feet 06/19/2014   Unspecified osteoarthritis, unspecified site 06/19/2014   Muscle weakness (generalized) 06/19/2014   Major depressive  disorder, single episode, unspecified 06/19/2014   Frequency of micturition 06/19/2014   Encounter for orthopedic aftercare following scoliosis surgery 06/19/2014   Cardiac arrhythmia, unspecified 06/19/2014   Age-related nuclear cataract, unspecified eye 06/19/2014   Scoliosis, unspecified 06/19/2014   Lumbar back pain 05/19/2012   Ureteric stone 02/18/2012   Leg pain, bilateral 11/17/2011   Hereditary and idiopathic peripheral neuropathy 11/06/2011   S/P cervical spinal fusion 11/06/2011   Gastro-esophageal reflux disease without esophagitis 11/06/2011   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 11/06/2011   Personal history of other diseases of the circulatory system 11/06/2011   Allergic rhinitis, unspecified 04/25/2010   Vulvodynia, 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   Anxiety and depression 03/04/2010   Other malaise and fatigue 02/05/2010   Vitamin D deficiency 01/28/2010   Neoplasm of uncertain behavior of skin 01/25/2010   Genital herpes 11/01/2009   Herpes simplex virus (HSV) infection 11/01/2009   Herpetic vulvovaginitis 10/23/2009   Kidney stone 08/09/2009   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   Brachial neuritis or radiculitis 07/23/2009    Immunization History  Administered Date(s) Administered   DTaP 07/07/2009   Influenza Inj Mdck Quad Pf 03/25/2019   Influenza Split 03/07/2012   Influenza, High Dose Seasonal PF 05/02/2016   Influenza,inj,Quad PF,6+ Mos 06/08/2013, 04/06/2015, 05/02/2017, 05/08/2018   Influenza-Unspecified 04/26/2021   PFIZER(Purple Top)SARS-COV-2 Vaccination 08/11/2019, 09/01/2019   Pneumococcal Conjugate-13 04/19/2015   Pneumococcal Polysaccharide-23 08/07/2009, 12/27/2013   Tdap 08/02/2020   Zoster Recombinat (Shingrix) 07/08/2010   Zoster, Live 07/07/2010    Conditions to be addressed/monitored:  {USCCMDZASSESSMENTOPTIONS:23563}  There are no care plans that you recently modified to display for this patient.     Medication Assistance: {MEDASSISTANCEINFO:25044}  Compliance/Adherence/Medication fill history: Care Gaps: ***  Star-Rating Drugs: ***  Patient's preferred pharmacy is:  [facility], [place] [address] Phone: [phone] Fax: [phone]  [facility], [place] [address] Phone: [phone] Fax: [phone]  Uses pill box? {Yes or If no, why not?:20788} Pt endorses ***% compliance  We discussed: {Pharmacy options:24294} Patient decided to: {[facility]  Care Plan and Follow Up Patient Decision:  {FOLLOWUP:24991}  Plan: {CM FOLLOW UP PPJK:93267}  ***     Chronic Care Management Pharmacy Note  09/04/2021 Name:  SHAILENE DEMONBREUN MRN:  [id number] DOB:  Mar 07, 1946  Summary: ***  Recommendations/Changes made from today's visit: ***  Plan: ***   Subjective: LEANA SPRINGSTON is an 76 y.o. year old female who is a primary patient of Tabori, [name], MD.  The CCM team was consulted for assistance with disease management and care coordination needs.    {CCMTELEPHONEFACETOFACE:21091510}  for {CCMINITIALFOLLOWUPCHOICE:21091511} in response to provider referral for pharmacy case management and/or care coordination services.   Consent to Services:  {CCMCONSENTOPTIONS:25074}  Patient Care Team: [name], MD as PCP - General (Family Medicine) Constance Haw, MD as PCP - Electrophysiology (Cardiology) Freada Bergeron, MD as PCP - Cardiology (Cardiology) Domingo Pulse, MD as Consulting Physician (Urology) Richmond Campbell, MD as Consulting Physician (Gastroenterology) Atilano Ina, MD as Referring Physician (Neurosurgery) Terrance Mass, MD (Inactive) as Consulting Physician (Gynecology) Jacolyn Reedy, MD as Consulting Physician (Cardiology) Kriste Basque, MD as Referring Physician (Pain Medicine) [facility] [place] Rolm Bookbinder, MD as Consulting Physician (Dermatology) Hinger Kerbs, MD as Consulting Physician (Specialist) Rexene Alberts, MD (Inactive) as Consulting Physician (Cardiothoracic Surgery) Melvenia Beam, MD as Consulting Physician (Neurology) [name], DDS as Consulting Physician (Dentistry) Madelin Rear, [facility] as Pharmacist (Pharmacist) Melvenia Beam, MD as Consulting Physician (Neurology) [name], [facility] as Pharmacist (Pharmacist)  Recent office visits: ***  Recent consult visits: [place] [facility] visits: {Hospital DC Yes/No:25215}   Objective:  Lab Results  Component Value Date   CREATININE 0.60 08/13/2021   BUN 10 08/13/2021   GFR 86.81 08/13/2021   EGFR 84 11/14/2020   GFRNONAA >60 02/06/2020   GFRAA >60 02/06/2020   NA 137 08/13/2021   K 4.3 08/13/2021   CALCIUM 9.0 08/13/2021   CO2 35 (H) 08/13/2021   GLUCOSE 92 08/13/2021    Lab Results  Component Value Date/Time   HGBA1C 5.6 04/16/2020 04:09 PM   HGBA1C 5.3 01/13/2020 12:00 PM   GFR 86.81 08/13/2021 11:28 AM   GFR 73.81 12/10/2020 01:38 PM    Last diabetic Eye exam: No results found for: HMDIABEYEEXA  Last diabetic Foot exam: No results found for: HMDIABFOOTEX   Lab Results  Component Value Date   CHOL 175 12/10/2020   HDL 80.40 12/10/2020   LDLCALC 79 12/10/2020   LDLDIRECT 136.5 06/08/2013   TRIG 79.0 12/10/2020   CHOLHDL 2 12/10/2020    Hepatic Function Latest Ref Rng & Units 12/10/2020 07/11/2020 04/16/2020  Total Protein 6.0 - 8.3 g/dL 6.9 7.4 6.9  Albumin 3.5 - 5.2 g/dL 4.2 4.3 -  AST 0 - 37 U/L 22 30 -  ALT 0 - 35 U/L 17 24 -  Alk Phosphatase 39 - 117 U/L 58 61 -  Total Bilirubin 0.2 - 1.2 mg/dL 0.4 0.4 -  Bilirubin, Direct 0.0 - 0.3 mg/dL 0.1 0.1 -    Lab Results  Component Value Date/Time   TSH 0.40 08/28/2021 10:56 AM   TSH 0.22 (L) 04/17/2021 12:27 PM   FREET4 1.58  04/17/2021 12:27 PM   FREET4 1.34 12/10/2020 01:38 PM    CBC Latest Ref Rng & Units 08/28/2021 08/13/2021 12/10/2020  WBC 4.0 - 10.5 K/uL 4.7 3.2(L) 5.8  Hemoglobin 12.0 - 15.0 g/dL 12.5 13.1 12.5  Hematocrit 36.0 - 46.0 % 38.0 40.2 38.2  Platelets 150.0 - 400.0 K/uL 180.0 121.0(L) 191.0    Lab Results  Component Value Date/Time   VD25OH 27.58 (L) 09/23/2017 02:20 PM   VD25OH 46 10/26/2015 11:40 AM   VD25OH 27 (L) 07/25/2015 12:31 PM    Clinical ASCVD: {YES/NO:21197} The 10-year ASCVD risk score (Arnett DK, et al., 2019) is: 27.2%   Values used to calculate the score:     Age: 40 years     Sex: Female     Is Non-Hispanic African American: No     Diabetic: No  Tobacco smoker: No     Systolic Blood Pressure: 785 mmHg     Is BP treated: Yes     HDL Cholesterol: 80.4 mg/dL     Total Cholesterol: 175 mg/dL    Depression screen [facility] 2/9 08/20/2021 08/13/2021 06/20/2021  Decreased Interest 0 1 1  Down, Depressed, Hopeless 0 1 0  PHQ - 2 Score 0 2 1  Altered sleeping 0 1 1  Tired, decreased energy 0 1 1  Change in appetite 0 1 0  Feeling bad or failure about yourself  0 0 0  Trouble concentrating 0 1 1  Moving slowly or fidgety/restless 0 1 0  Suicidal thoughts 0 0 0  PHQ-9 Score 0 7 4  Difficult doing work/chores Not difficult at all - Not difficult at all  Some recent data might be hidden     ***Other: (CHADS2VASc if Afib, MMRC or CAT for COPD, ACT, DEXA)  Social History   Tobacco Use  Smoking Status Former   Packs/day: 0.10   Years: 14.00   Pack years: 1.40   Types: Cigarettes   Quit date: 1980   Years since quitting: 43.1  Smokeless Tobacco Never  Tobacco Comments   03/31/2018 "quit ~ 1980; someday smoker when I did smoke; never addicted"   BP Readings from Last 3 Encounters:  08/20/21 (!) 141/60  08/19/21 132/78  08/13/21 124/66   Pulse Readings from Last 3 Encounters:  08/20/21 82  08/19/21 63  08/13/21 87   Wt Readings from Last 3 Encounters:   08/20/21 116 lb (52.6 kg)  08/19/21 117 lb 12.8 oz (53.4 kg)  08/13/21 116 lb 6.4 oz (52.8 kg)   BMI Readings from Last 3 Encounters:  08/20/21 18.72 kg/m  08/19/21 19.01 kg/m  08/13/21 18.79 kg/m    Assessment/Interventions: Review of patient past medical history, allergies, medications, health status, including review of consultants reports, laboratory and other test data, was performed as part of comprehensive evaluation and provision of chronic care management services.   SDOH:  (Social Determinants of Health) assessments and interventions performed: {yes/no:20286}  SDOH Screenings   Alcohol Screen: Not on file  Depression (PHQ2-9): Low Risk    PHQ-2 Score: 0  Financial Resource Strain: Not on file  Food Insecurity: Not on file  Housing: Not on file  Physical Activity: Not on file  Social Connections: Not on file  Stress: Not on file  Tobacco Use: Medium Risk   Smoking Tobacco Use: Former   Smokeless Tobacco Use: Never   Passive Exposure: Not on file  Transportation Needs: Not on file    CCM Care Plan  Allergies  Allergen Reactions   Buprenorphine Nausea Only and Other (See Comments)    sedation and adhesive reaction Other reaction(s): nausea, sedation and adhesive reaction   Cymbalta [Duloxetine Hcl]     Severe diarrhea and upset stomach   Erythromycin Nausea And Vomiting    Dizziness    Hydrocodone Other (See Comments)    Sedation,dizziness, and nausea Other reaction(s): sedation and nausea Other reaction(s): sedation and nausea   Hydromorphone Other (See Comments)    Sedation,dizziness, and nausea Other reaction(s): sedation and nausea Other reaction(s): sedation and nausea   Iodinated Contrast Media Hives and Nausea Only   Iodine     unknown   Levofloxacin Nausea And Vomiting   Metrizamide Hives   Nsaids     CANNOT TAKE PER CARDIOLOGIST DUE TO AFIB    Nucynta [Tapentadol Hcl]     nausea and sedation  Nucynta [Tapentadol] Nausea Only   Other  Other (See Comments)    CAN NOT TAKE , aLPRAZOLAM, OR ELAVIL DUE TO AFIB FOR ANXIETY  IV CONTRAST /"DYE". Other reaction(s): hives and sores   Oxycodone Other (See Comments)    Delusions (intolerance)  PILLS ONLY sedation, dizziness, nausea,  and itching Other reaction(s): sedation and nausea Other reaction(s): sedation and nausea   Pentazocine Other (See Comments)    Unknown Other reaction(s): Unknown Other reaction(s): Unknown   Septra [Bactrim]     Hives    Sulfasalazine Hives   Adhesive [Tape] Rash and Other (See Comments)    Heart monitor stickers must be rotated in order to prevent rash   Latex Palpitations and Other (See Comments)     hives and sores Other reaction(s): hives and sores   Morphine Anxiety and Other (See Comments)    sedation and nausea Other reaction(s): sedation and nausea Other reaction(s): sedation and nausea   Sulfa Antibiotics Hives, Nausea Only and Rash    rash    Medications Reviewed Today     Reviewed by Maximiano Coss, NP (Nurse Practitioner) on 08/20/21 at 1348  Med List Status: <None>   Medication Order Taking? Sig Documenting Provider Last Dose Status Informant  acetaminophen (TYLENOL) 500 MG tablet [id number] Yes Take 500 mg by mouth every 6 (six) hours as needed for moderate pain.  [provider] Taking Active Self  amLODipine (NORVASC) 5 MG tablet [id number] No Take 1 tablet by mouth once daily  Patient not taking: Reported on 08/19/2021   Maximiano Coss, NP Not Taking Active   b complex vitamins tablet [id number] Yes Take 1 tablet by mouth daily. [provider] Taking Active Self  Calcium Carbonate Antacid (TUMS CHEWY BITES PO) [id number] Yes Take 1 tablet by mouth daily as needed (reflux).  [provider] Taking Active Self  Calcium Citrate-Vitamin D (CALCIUM + D PO) [id number] Yes Take 1 tablet by mouth daily. [provider] Taking Active   Carboxymethylcellul-Glycerin (LUBRICATING EYE DROPS OP)  [id number] Yes Place 1 drop into both eyes daily as needed (dry eyes). [provider] Taking Active Self  clonazePAM (KLONOPIN) 0.5 MG tablet [id number] Yes Take 1 tablet by mouth twice daily as needed for anxiety [name], MD Taking Active   cyclobenzaprine (FLEXERIL) 10 MG tablet [id number] Yes Take 10 mg by mouth at bedtime as needed for muscle spasms.  [provider] Taking Active Self  denosumab (PROLIA) 60 MG/ML SOSY injection [id number] Yes Inject 60 mg into the skin every 6 (six) months. [provider] Taking Active Self           Med Note Shari Heritage Jun 11, 2020  2:33 PM)    doxycycline (VIBRA-TABS) 100 MG tablet [id number] Yes Take 1 tablet (100 mg total) by mouth 2 (two) times daily. Maximiano Coss, NP Taking Active   furosemide (LASIX) 20 MG tablet [id number] Yes Take 1 tablet (20 mg total) by mouth daily. Freada Bergeron, MD Taking Active   levothyroxine (SYNTHROID) 75 MCG tablet [id number] Yes Take 1 tablet by mouth once daily [name], MD Taking Active   meclizine (ANTIVERT) 25 MG tablet [id number] Yes TAKE 1 TABLET BY MOUTH THREE TIMES DAILY AS NEEDED FOR DIZZINESS [name], MD Taking Active   metoprolol succinate (TOPROL-XL) 50 MG 24 hr tablet [id number] Yes Take 1 tablet (50 mg total) by mouth at bedtime. Take with or immediately following a meal. Johney Frame, Greer Ee,  MD Taking Active   metoprolol tartrate (LOPRESSOR) 25 MG tablet [id number] Yes Take 1 tablet (25 mg total) by mouth 3 (three) times daily as needed (PALPITATINS). Freada Bergeron, MD Taking Active   Nutritional Supplements (,FEEDING SUPPLEMENT, PROSOURCE PLUS) liquid [id number] Yes Take 30 mLs by mouth 2 (two) times daily between meals. Bary Leriche, PA-C Taking Active   nystatin (MYCOSTATIN/NYSTOP) powder [id number] Yes Apply topically 2 (two) times daily as needed (skin irritation (under breasts)). Bary Leriche, PA-C Taking Active    ondansetron (ZOFRAN) 4 MG tablet [id number] Yes Take 1 tablet (4 mg total) by mouth every 8 (eight) hours as needed. [name], MD Taking Active   oxyCODONE (ROXICODONE) 5 MG/5ML solution [id number] Yes Take 4 mLs (4 mg total) by mouth every 4 (four) hours as needed for moderate pain. Bary Leriche, PA-C Taking Active            Med Note Shari Heritage Jun 11, 2020  2:33 PM)    polyethylene glycol [facility] / GLYCOLAX) packet [id number] Yes Take 17 g by mouth daily as needed for mild constipation. [provider] Taking Active Self  promethazine (PHENERGAN) 25 MG tablet [id number] Yes Take 1 tablet (25 mg total) by mouth 2 (two) times daily as needed for nausea or vomiting. Maximiano Coss, NP  Active   rivaroxaban (XARELTO) 20 MG TABS tablet [id number] Yes Take 1 tablet (20 mg total) by mouth daily with supper. Freada Bergeron, MD Taking Active   sertraline (ZOLOFT) 25 MG tablet [id number] Yes Take 1 tablet (25 mg total) by mouth daily. [name], MD Taking Active   sodium chloride (OCEAN) 0.65 % SOLN nasal spray [id number] Yes Place 1 spray into both nostrils as needed for congestion (nose bleeds). [provider] Taking Active Self  triamcinolone ointment (KENALOG) 0.1 % [id number] Yes Apply 1 application topically 2 (two) times daily. [name], MD Taking Active   valACYclovir (VALTREX) 500 MG tablet [id number] Yes Take 500 mg by mouth daily as needed (breakouts).  [provider] Taking Active Self            Patient Active Problem List   Diagnosis Date Noted   HTN (hypertension) 06/20/2021   Visit for wound check 04/29/2021   Therapeutic opioid-induced constipation (OIC) 04/03/2021   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   Atrial fibrillation ([place]) 02/09/2020   Long term (current) use of anticoagulants 02/09/2020   Acute blood loss anemia 02/09/2020   Debility 02/02/2020   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   S/P Maze operation for  atrial fibrillation 01/17/2020   Severe mitral regurgitation    Mitral regurgitation due to cusp prolapse    Ankle swelling, left 10/20/2019   Ankle swelling, right 10/20/2019   Secondary hypercoagulable state ([place]) 08/19/2019   Left inguinal hernia 03/11/2019   Visit for monitoring Tikosyn therapy 03/29/2018   Osteoporosis 09/23/2017   Fatigue 02/18/2017   Physical exam 04/21/2016   Positional lightheadedness 11/16/2015   B12 deficiency 10/01/2015   Hearing loss due to cerumen impaction 08/23/2015   Foot pain, right 05/23/2015   Hyperlipidemia 04/19/2015   Protein-calorie malnutrition ([place]) 10/19/2014   Fracture of multiple ribs 08/07/2014   Tachycardia    Shortness of breath 08/06/2014   Constipation 07/14/2014   Vitamin B12 deficiency anemia, unspecified 06/19/2014   Hypokalemia 06/19/2014   Other chronic pain 06/19/2014   Polyneuropathy, unspecified 06/19/2014   Zoster without complications 23/76/2831   Vitreous degeneration, bilateral 06/19/2014  Nutritional deficiency, unspecified 06/19/2014   Nocturia 67/89/3810   Other symbolic dysfunctions 17/51/0258   Unsteadiness on feet 06/19/2014   Unspecified osteoarthritis, unspecified site 06/19/2014   Muscle weakness (generalized) 06/19/2014   Major depressive disorder, single episode, unspecified 06/19/2014   Frequency of micturition 06/19/2014   Encounter for orthopedic aftercare following scoliosis surgery 06/19/2014   Cardiac arrhythmia, unspecified 06/19/2014   Age-related nuclear cataract, unspecified eye 06/19/2014   Scoliosis, unspecified 06/19/2014   Lumbar back pain 05/19/2012   Ureteric stone 02/18/2012   Leg pain, bilateral 11/17/2011   Hereditary and idiopathic peripheral neuropathy 11/06/2011   S/P cervical spinal fusion 11/06/2011   Gastro-esophageal reflux disease without esophagitis 11/06/2011   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 11/06/2011   Personal history of other diseases of the  circulatory system 11/06/2011   Allergic rhinitis, unspecified 04/25/2010   Vulvodynia, 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   Anxiety and depression 03/04/2010   Other malaise and fatigue 02/05/2010   Vitamin D deficiency 01/28/2010   Neoplasm of uncertain behavior of skin 01/25/2010   Genital herpes 11/01/2009   Herpes simplex virus (HSV) infection 11/01/2009   Herpetic vulvovaginitis 10/23/2009   Kidney stone 08/09/2009   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   Brachial neuritis or radiculitis 07/23/2009    Immunization History  Administered Date(s) Administered   DTaP 07/07/2009   Influenza Inj Mdck Quad Pf 03/25/2019   Influenza Split 03/07/2012   Influenza, High Dose Seasonal PF 05/02/2016   Influenza,inj,Quad PF,6+ Mos 06/08/2013, 04/06/2015, 05/02/2017, 05/08/2018   Influenza-Unspecified 04/26/2021   PFIZER(Purple Top)SARS-COV-2 Vaccination 08/11/2019, 09/01/2019   Pneumococcal Conjugate-13 04/19/2015   Pneumococcal Polysaccharide-23 08/07/2009, 12/27/2013   Tdap 08/02/2020   Zoster Recombinat (Shingrix) 07/08/2010   Zoster, Live 07/07/2010    Conditions to be addressed/monitored:  Afib, HTN, Allergic Rhinitis, Hypothyroid, Osteoporosis, HLD, Anxiety/Depression  There are no care plans that you recently modified to display for this patient.    Medication Assistance: {MEDASSISTANCEINFO:25044}  Compliance/Adherence/Medication fill history: Care Gaps: ***  Star-Rating Drugs: ***  Patient's preferred pharmacy is:  [facility], [address] [place] [address] Phone: [phone] Fax: [phone]  [facility] [place], [address] [place] [address] Phone: [phone] Fax: [phone]  Uses pill box? {Yes or If no, why not?:20788} Pt endorses ***% compliance  We discussed: {Pharmacy options:24294} Patient  decided to: {US Pharmacy Plan:23885}  Care Plan and Follow Up Patient Decision:  {FOLLOWUP:24991}  Plan: {CM FOLLOW UP LZJQ:73419}  ***   Current Barriers:  {pharmacybarriers:24917}  Pharmacist Clinical Goal(s):  Patient will {PHARMACYGOALCHOICES:24921} through collaboration with PharmD and provider.   Interventions: 1:1 collaboration with [name], MD regarding development and update of comprehensive plan of care as evidenced by provider attestation and co-signature Inter-disciplinary care team collaboration (see longitudinal plan of care) Comprehensive medication review performed; medication list updated in electronic medical record  Hypertension (BP goal {CHL HP UPSTREAM Pharmacist BP ranges:[phone]}) -{US controlled/uncontrolled:25276} -Current treatment: *** -Medications previously tried: ***  -Current home readings: *** -Current dietary habits: *** -Current exercise habits: *** -{ACTIONS;DENIES/REPORTS:21021675::"Denies"} hypotensive/hypertensive symptoms -Educated on {CCM BP Counseling:25124} -Counseled to monitor BP at home ***, document, and provide log at future appointments -{CCMPHARMDINTERVENTION:25122}  Hyperlipidemia: (LDL goal < ***) -{US controlled/uncontrolled:25276} -Current treatment: *** -Medications previously tried: ***  -Current dietary patterns: *** -Current exercise habits: *** -Educated on {CCM HLD Counseling:25126} -{CCMPHARMDINTERVENTION:25122}  Atrial Fibrillation (Goal: prevent stroke and major  bleeding) -{US controlled/uncontrolled:25276} -CHADSVASC: *** -Current treatment: Rate control: *** Anticoagulation: *** -Medications previously tried: *** -Home BP and HR readings: ***  -Counseled on {CCMAFIBCOUNSELING:25120} -{CCMPHARMDINTERVENTION:25122}  Depression/Anxiety (Goal: ***) -{US controlled/uncontrolled:25276} -Current treatment: *** -Medications previously tried/failed: *** -PHQ9: *** -GAD7: *** -Connected  with *** for mental health support -Educated on {CCM mental health counseling:25127} -{CCMPHARMDINTERVENTION:25122}  Osteoporosis / Osteopenia (Goal ***) -{US controlled/uncontrolled:25276} -Last DEXA Scan: ***   T-Score femoral neck: ***  T-Score total hip: ***  T-Score lumbar spine: ***  T-Score forearm radius: ***  10-year probability of major osteoporotic fracture: ***  10-year probability of hip fracture: *** -Patient {is;is not an osteoporosis candidate:23886} -Current treatment  *** -Medications previously tried: ***  -{Osteoporosis Counseling:23892} -{CCMPHARMDINTERVENTION:25122}  Hypothyroidism (Goal: ***) -{US controlled/uncontrolled:25276} -Current treatment  *** -Medications previously tried: ***  -{CCMPHARMDINTERVENTION:25122}  Allergic Rhinitis (Goal: ***) -{US controlled/uncontrolled:25276} -Current treatment  *** -Medications previously tried: ***  -{CCMPHARMDINTERVENTION:25122}  Patient Goals/Self-Care Activities Patient will:  - {pharmacypatientgoals:24919}  Follow Up Plan: {CM FOLLOW UP QHUT:65465}

## 2021-09-10 NOTE — Telephone Encounter (Signed)
**Note De-Identified [name] Obfuscation** The pt called to let me know that she received a letter from [facility] stating that she has been approved for Eliquis asst until 07/06/2022. ?She expressed much appreciation for all of our assistance in helping her with this. ?

## 2021-09-11 ENCOUNTER — Telehealth: Payer: Self-pay | Admitting: Cardiology

## 2021-09-11 DIAGNOSIS — Z79891 Long term (current) use of opiate analgesic: Secondary | ICD-10-CM | POA: Diagnosis not present

## 2021-09-11 DIAGNOSIS — G894 Chronic pain syndrome: Secondary | ICD-10-CM | POA: Diagnosis not present

## 2021-09-11 DIAGNOSIS — M961 Postlaminectomy syndrome, not elsewhere classified: Secondary | ICD-10-CM | POA: Diagnosis not present

## 2021-09-11 DIAGNOSIS — M5412 Radiculopathy, cervical region: Secondary | ICD-10-CM | POA: Diagnosis not present

## 2021-09-11 DIAGNOSIS — M4693 Unspecified inflammatory spondylopathy, cervicothoracic region: Secondary | ICD-10-CM | POA: Diagnosis not present

## 2021-09-11 NOTE — Telephone Encounter (Signed)
Pt was calling to see if she could get a later appt time with Dr. Johney Frame on 3/23, for she states coming at [place] is hard on her body. ?Was able to provide the pt with a later time that day to see Dr. Johney Frame.  She will now see Dr. Johney Frame on 3/23 at 1:40 pm.  She is aware to arrive 15 mins prior to this appt.  ?Pt verbalized understanding and agrees with this plan. ?

## 2021-09-11 NOTE — Telephone Encounter (Signed)
Patient would like to know if the appt she has scheduled for 3/23 with Dr. Johney Frame can be switched to a telephone visit. If it can't be she will keep the appt.  ?

## 2021-09-12 ENCOUNTER — Telehealth: Payer: Self-pay

## 2021-09-12 NOTE — Telephone Encounter (Signed)
If she has already taken her Xarelto for today, she can start the Eliquis tomorrow.  If she hasn't taken her Xarelto today and would like to start the Eliquis on a day when she can take both doses, she can take Xarelto tonight and then start the Eliquis in the morning. ?

## 2021-09-12 NOTE — Telephone Encounter (Signed)
Caller name:[name]  ? ?On DPR? :Yes ? ?Call back [phone] ? ?Provider they see: [name]  ? ?Reason for call:Pt received in the mail the apixaban (ELIQUIS) 5 MG TABS tablet this is the new medication that she needs to start she wants to talk to Delfino Lovett or [name] if it is ok to take the medication because she is taking two pills now she is supposed to start taking medication tonight or does she need to wait and take her old medication tonight xarelto  ? ?

## 2021-09-13 ENCOUNTER — Telehealth: Payer: Self-pay | Admitting: Cardiology

## 2021-09-13 NOTE — Telephone Encounter (Signed)
No same side effects apply.  However Eliquis is a twice a day medication. She should take her first dose of Eliquis when her dose of Xarelto would have been due. ?

## 2021-09-13 NOTE — Telephone Encounter (Signed)
Pt called and said she missed a call from either Richard or [name] and would like to be called back.  ? ?Please advice ? ?Thank You ?

## 2021-09-13 NOTE — Telephone Encounter (Signed)
Called and relayed comments from [place], Rph to patient. She states she took her Xarelto last night and her next dose will be due tonight. She condones that she will begin the Eliquis tonight and continue with next dose tomorrow morning and so on. Pt states she did have a recent nosebleed that she had to go [place] [facility] for, so she was concerned with the switch. ?

## 2021-09-13 NOTE — Telephone Encounter (Signed)
Pt c/o medication issue: ? ?1. Name of Medication:  ?apixaban (ELIQUIS) 5 MG TABS tablet ? ?2. How are you currently taking this medication (dosage and times per day)? Has not started ? ?3. Are you having a reaction (difficulty breathing--STAT)? no ? ?4. What is your medication issue? Patient states she is switching from xarelto to eliquis. She would like to know if there is any different side effects or anything changing when she switches to the new medication. ?

## 2021-09-13 NOTE — Telephone Encounter (Signed)
Lvm for pt to return call.

## 2021-09-23 NOTE — Progress Notes (Deleted)
?Cardiology Office Note:   ? ?Date:  09/23/2021  ? ?ID:  [name], DOB 01-18-1946, MRN [id number] ? ?PCP:  [name], MD ?  ?[facility] Providers ?Cardiologist:  Freada Bergeron, MD ?Electrophysiologist:  Will Meredith Leeds, MD  ?{ ? ? ?Referring MD: [name], MD  ? ? ? ?History of Present Illness:   ? ?[name] is a 76 y.o. female with a hx of severe MR s/p MV repair (32 mm Sorin Memo 4D ring annuloplasty valve) complicated by LV free wall laceration requiring median sternotomy, pAfib, scoliosis requiring multiple spine surgeries and anxiety who presents to clinic for follow-up. ?  ?Per review of the record, patient has been followed for MVP/MR. TEE on 11/21/19 demonstrated presence of mitral valve prolapse with a large flail segment felt to be the A3 portion of the anterior leaflet with severe mitral regurgitation. She was seen by Dr. Ricard Dillon and underwent Minimally Invasive Mitral Valve Repair, Complete MAZE procedure, and Sternotomy with repair of Left Ventricular Free- wall laceration on 01/17/20.  She was extubated the evening of surgery. She was started on coumadin for her Mitral Valve Repair.  Her post-op course was complicated by acute blood loss anemia requiring blood transfusions and Afib with RVR for which she was placed on Amiodarone and lopressor and her tikosyn was discontinued. She follows with [name] in clinic and has since been transitioned to [place]. ?  ?Seen in clinic 08/23/20 where she was having right sided chest pain/burning where her incision site was located. She was very concerned that it may be related to her heart or breast cancer. Given localization around scar tissue site, nonexertional nature, and no masses, suspected it was likely related to neuropathic pain and scar tissue. CTA, chest xray, d-dimer all reassuringly normal. Given her significant allergies to medications, we recommended she follow-up with her chronic pain physician. ? ?Saw Dr. Roxy Manns  on 01/15/21 where she was deemed to be stable from a CV standpoint. ? ?Was seen in clinic on 01/23/21 where she was doing to complain of intermittent shortness of breath, chronic pain and depression. No changes in meds at that time. ? ?Last seen in 04/2021 by Dr. Marlou Porch for Afib. At that visit, she was back in NSR and she was continued on metop and xarelto.  ? ?Last seen in clinic 08/2021 where she was stable from a CV standpoint. Continuing to suffer from a lot of stress and anxiety. Called clinic and asked for another visit to discuss her medications.  ? ?Today, *** ?  ? ?Past Medical History:  ?Diagnosis Date  ? Allergies   ? Anxiety   ? Arthritis   ? "maybe in my back" (03/31/2018)  ? Benign paroxysmal positional vertigo 06/08/2013  ? Complication of anesthesia   ? Fracture of multiple ribs 2015  ? "don't know from what; dx'd when I in hospital for 1st back OR" (03/31/2018)  ? GERD (gastroesophageal reflux disease)   ? Hair loss 04/12/2012  ? Herpes   ? History of blood transfusion   ? "twice; related to back OR" (03/31/2018)  ? History of kidney stones   ? Interstitial cystitis 11/06/2011  ? 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 "right"  ? Mitral regurgitation   ? Osteopenia 02/18/2012  ? Osteoporosis   ? PAF (paroxysmal atrial fibrillation) ([place]) 2012  ? Peripheral neuropathy 11/06/2011  ? PMDD (premenstrual dysphoric disorder)   ? PONV (postoperative nausea and vomiting)   ? nausea,  vomiting, hives and dizziness   ? 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/2020  ? Complete bilateral atrial lesion set using cryothermy and bipolar radiofrequency ablation with clipping of LA appendage via right mini-thoracotomy approach  ? 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 Complex valvuloplasty including artificial Gore-tex neochord placement x12 with 6m Sorin Memo 4D ring annuloplasty  ? Seasonal allergies   ? Vaginal delivery   ? ONE NSVD  ? 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 ? ?Past Surgical History:  ?Procedure Laterality Date   ? ANTERIOR CERVICAL DECOMP/DISCECTOMY FUSION  ~ 2003  ? BACK SURGERY    ? BREAST SURGERY    ? BREAST BIOPSY--RIGHT BENIGN  ? BUNIONECTOMY Bilateral   ? CARDIOVERSION N/A 01/26/2020  ? Procedure: CARDIOVERSION;  Surgeon: OGeralynn Rile MD;  [facility] [facility]  Service: Cardiovascular;  Laterality: N/A;  ? COSMETIC SURGERY  2016  ? "back of my neck; related to earlier fusion"  ? CYSTOSCOPY W/ STONE MANIPULATION  "several times"  ? DILATION AND CURETTAGE OF UTERUS    ? FOREHEAD RECONSTRUCTION Right   ? "removed bone protruding out of my forehead"  ? HARDWARE REMOVAL  2016  ? "related to neck OR"  ? INCONTINENCE SURGERY    ? IR THORACENTESIS ASP PLEURAL SPACE W/IMG GUIDE  02/16/2020  ? MINIMALLY INVASIVE MAZE PROCEDURE N/A 01/17/2020  ? Procedure: MINIMALLY INVASIVE MAZE PROCEDURE;  Surgeon: ORexene Alberts MD;  [place] [facility]  Service: Open Heart Surgery;  Laterality: N/A;  ? MITRAL VALVE REPAIR Right 01/17/2020  ? Procedure: MINIMALLY INVASIVE MITRAL VALVE REPAIR (MVR) USING MEMO 4D 32MM;  Surgeon: ORexene Alberts MD;  [facility]  Service: [facility] Surgery;  Laterality: Right;  ? POSTERIOR CERVICAL FUSION/FORAMINOTOMY  ~ 2008; 2015  ? RIGHT/LEFT HEART CATH AND CORONARY ANGIOGRAPHY N/A 12/02/2019  ? Procedure: RIGHT/LEFT HEART CATH AND CORONARY ANGIOGRAPHY;  Surgeon: MBurnell Blanks MD;  [facility];  Service: Cardiovascular;  Laterality: N/A;  ? SHOULDER ARTHROSCOPY W/ ROTATOR CUFF REPAIR Right 2012  ? SPINAL FUSION  06/2014 - 2018 X ?7  ? "scoliosis; my entire back"  ? TEE WITHOUT CARDIOVERSION N/A 11/21/2019  ? Procedure: TRANSESOPHAGEAL ECHOCARDIOGRAM (TEE);  Surgeon: NJosue Hector MD;  [facility];  Service: Cardiovascular;  Laterality: N/A;  ? TEE WITHOUT CARDIOVERSION N/A 01/17/2020  ? Procedure: TRANSESOPHAGEAL ECHOCARDIOGRAM (TEE);  Surgeon: ORexene Alberts MD;  [place] [facility]  Service: Open Heart Surgery;  Laterality: N/A;  ? TEE WITHOUT  CARDIOVERSION N/A 01/26/2020  ? Procedure: TRANSESOPHAGEAL ECHOCARDIOGRAM (TEE);  Surgeon: OGeralynn Rile MD;  [place] [facility]  Service: Cardiovascular;  Laterality: N/A;  ? TUBAL LIGATION    ? VAGINAL HYSTERECTOMY    ? TVH  ? ? ?Current Medications: ?No outpatient medications have been marked as taking for the 09/26/21 encounter (Appointment) with PFreada Bergeron MD.  ?  ? ?Allergies:   Buprenorphine, Cymbalta [duloxetine hcl], Erythromycin, Hydrocodone, Hydromorphone, Iodinated contrast media, Iodine, Levofloxacin, Metrizamide, Nsaids, Nucynta [tapentadol hcl], Nucynta [tapentadol], Other, Oxycodone, Pentazocine, Septra [bactrim], Sulfasalazine, Adhesive [tape], Latex, Morphine, and Sulfa antibiotics  ? ?Social History  ? ?Socioeconomic History  ? Marital status: Divorced  ?  Spouse name: none/divorced  ? Number of children: Not on file  ? Years of education: Not on file  ? Highest education level: 12th grade  ?Occupational History  ? Occupation: retired  ?Tobacco Use  ? Smoking status: Former  ?  Packs/day: 0.10  ?  Years: 14.00  ?  Pack years: 1.40  ?  Types: Cigarettes  ?  Quit date: 70  ?  Years since quitting: 43.2  ? Smokeless tobacco: Never  ? Tobacco comments:  ?  03/31/2018 "quit ~ 1980; someday smoker when I did smoke; never addicted"  ?Vaping Use  ? Vaping Use: Never used  ?Substance and Sexual Activity  ? Alcohol use: Never  ? Drug use: Yes  ?  Types: Oxycodone, Benzodiazepines  ?  Comment: 03/31/2018 "for chronic neck and back pain", takes Klonopin at times.   ? Sexual activity: Not Currently  ?Other Topics Concern  ? Not on file  ?Social History Narrative  ? Lives by herself.   ? ?Social Determinants of Health  ? ?Financial Resource Strain: Not on file  ?Food Insecurity: Not on file  ?Transportation Needs: Not on file  ?Physical Activity: Not on file  ?Stress: Not on file  ?Social Connections: Not on file  ?  ? ?Family History: ?The patient's family history includes Arthritis in  her mother; Diabetes in her brother, brother, and father; Heart Problems in her brother; Heart disease in her mother, sister, and sister; Hyperlipidemia in her sister and sister; Stroke in her sister; Uterine

## 2021-09-24 ENCOUNTER — Encounter: Payer: Self-pay | Admitting: Family Medicine

## 2021-09-24 DIAGNOSIS — M546 Pain in thoracic spine: Secondary | ICD-10-CM

## 2021-09-25 ENCOUNTER — Encounter: Payer: Self-pay | Admitting: Family Medicine

## 2021-09-25 NOTE — Progress Notes (Addendum)
?Cardiology Office Note:   ? ?Date:  10/17/2021  ? ?ID:  [name], DOB January 05, 1946, MRN [id number] ? ?PCP:  [name], MD ?  ?[facility] Providers ?Cardiologist:  Freada Bergeron, MD ?Electrophysiologist:  Will Meredith Leeds, MD  ?{ ?Referring MD: [name], MD  ? ?History of Present Illness:   ? ?[name] is a 76 y.o. female with a hx of severe MR s/p MV repair (32 mm Sorin Memo 4D ring annuloplasty valve) complicated by LV free wall laceration requiring median sternotomy, pAfib, scoliosis requiring multiple spine surgeries and anxiety who presents to clinic for follow-up. ?  ?Per review of the record, patient has been followed for MVP/MR. TEE on 11/21/19 demonstrated presence of mitral valve prolapse with a large flail segment felt to be the A3 portion of the anterior leaflet with severe mitral regurgitation. She was seen by Dr. Ricard Dillon and underwent Minimally Invasive Mitral Valve Repair, Complete MAZE procedure, and Sternotomy with repair of Left Ventricular Free- wall laceration on 01/17/20.  She was extubated the evening of surgery. She was started on coumadin for her Mitral Valve Repair.  Her post-op course was complicated by acute blood loss anemia requiring blood transfusions and Afib with RVR for which she was placed on Amiodarone and lopressor and her tikosyn was discontinued. She follows with [name] in clinic and has since been transitioned to [place]. ?  ?Seen in clinic 08/23/20 where she was having right sided chest pain/burning where her incision site was located. She was very concerned that it may be related to her heart or breast cancer. Given localization around scar tissue site, nonexertional nature, and no masses, suspected it was likely related to neuropathic pain and scar tissue. CTA, chest xray, d-dimer all reassuringly normal. Given her significant allergies to medications, we recommended she follow-up with her chronic pain physician. ? ?Saw Dr. Roxy Manns on  01/15/21 where she was deemed to be stable from a CV standpoint. ? ?Was seen in clinic on 01/23/21 where she was doing to complain of intermittent shortness of breath, chronic pain and depression. No changes in meds at that time. ? ?Last seen in 04/2021 by Dr. Marlou Porch for Afib. At that visit, she was back in NSR and she was continued on metop and xarelto.  ? ?Last seen in clinic 08/2021 where she was stable from a CV standpoint. Continuing to suffer from a lot of stress and anxiety. Called clinic and asked for another visit to discuss her medications.  ? ?Today, she is not doing well. She has several issues she would like to address.  ? ?Mild exertion, such as walking across the room, will cause her to because short of breath. Typically, she will walk across large stores for exercise. She will need to pause to catch her breath. The shortness of breath started after her valve repair surgery.  ? ?Yesterday morning at 4 AM, she had an episode of full-body twitching and palpitations. She took pain medication and an extra tablet of metoprolol which resolved the episodes. She reports similar episodes occurring occasionally.  ? ?She endorses knife-like cutting pain in her upper back between her scapulae. Additionally, her head also hurts and palpation worsens the head pain. She has a surgical history of hardware insertion. She will see the orthopedic surgeon next month and continues to see pain management. She is interested in Botox injections for the pain ? ?She takes Lasix once a day for occasional bilateral LE swelling.  ? ?Past  Medical History:  ?Diagnosis Date  ? Allergies   ? Anxiety   ? Arthritis   ? "maybe in my back" (03/31/2018)  ? Benign paroxysmal positional vertigo 06/08/2013  ? Complication of anesthesia   ? Fracture of multiple ribs 2015  ? "don't know from what; dx'd when I in hospital for 1st back OR" (03/31/2018)  ? GERD (gastroesophageal reflux disease)   ? Hair loss 04/12/2012  ? Herpes   ? History of blood  transfusion   ? "twice; related to back OR" (03/31/2018)  ? History of kidney stones   ? Interstitial cystitis 11/06/2011  ? 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 "right"  ? Mitral regurgitation   ? Osteopenia 02/18/2012  ? Osteoporosis   ? PAF (paroxysmal atrial fibrillation) ([place]) 2012  ? Peripheral neuropathy 11/06/2011  ? PMDD (premenstrual dysphoric disorder)   ? PONV (postoperative nausea and vomiting)   ? nausea, vomiting, hives and dizziness   ? 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/2020  ? Complete bilateral atrial lesion set using cryothermy and bipolar radiofrequency ablation with clipping of LA appendage via right mini-thoracotomy approach  ? 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 Complex valvuloplasty including artificial Gore-tex neochord placement x12 with 5m Sorin Memo 4D ring annuloplasty  ? Seasonal allergies   ? Vaginal delivery   ? ONE NSVD  ? 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 ? ?Past Surgical History:  ?Procedure Laterality Date  ? ANTERIOR CERVICAL DECOMP/DISCECTOMY FUSION  ~ 2003  ? BACK SURGERY    ? BREAST SURGERY    ? BREAST BIOPSY--RIGHT BENIGN  ? BUNIONECTOMY Bilateral   ? CARDIOVERSION N/A 01/26/2020  ? Procedure: CARDIOVERSION;  Surgeon: OGeralynn Rile MD;  [place] [facility]  Service: Cardiovascular;  Laterality: N/A;  ? COSMETIC SURGERY  2016  ? "back of my neck; related to earlier fusion"  ? CYSTOSCOPY W/ STONE MANIPULATION  "several times"  ? DILATION AND CURETTAGE OF UTERUS    ? FOREHEAD RECONSTRUCTION Right   ? "removed bone protruding out of my forehead"  ? HARDWARE REMOVAL  2016  ? "related to neck OR"  ? INCONTINENCE SURGERY    ? IR THORACENTESIS ASP PLEURAL SPACE W/IMG GUIDE  02/16/2020  ? MINIMALLY INVASIVE MAZE PROCEDURE N/A 01/17/2020  ? Procedure: MINIMALLY INVASIVE MAZE PROCEDURE;  Surgeon: ORexene Alberts MD;  [place] [facility]  Service: Open Heart Surgery;  Laterality: N/A;  ? MITRAL VALVE REPAIR Right 01/17/2020  ? Procedure: MINIMALLY INVASIVE MITRAL VALVE  REPAIR (MVR) USING MEMO 4D 32MM;  Surgeon: ORexene Alberts MD;  [facility] [facility]  Service: Open Heart Surgery;  Laterality: Right;  ? POSTERIOR CERVICAL FUSION/FORAMINOTOMY  ~ 2008; 2015  ? RIGHT/LEFT HEART CATH AND CORONARY ANGIOGRAPHY N/A 12/02/2019  ? Procedure: RIGHT/LEFT HEART CATH AND CORONARY ANGIOGRAPHY;  Surgeon: MBurnell Blanks MD;  [facility];  Service: Cardiovascular;  Laterality: N/A;  ? SHOULDER ARTHROSCOPY W/ ROTATOR CUFF REPAIR Right 2012  ? SPINAL FUSION  06/2014 - 2018 X ?7  ? "scoliosis; my entire back"  ? TEE WITHOUT CARDIOVERSION N/A 11/21/2019  ? Procedure: TRANSESOPHAGEAL ECHOCARDIOGRAM (TEE);  Surgeon: NJosue Hector MD;  [facility];  Service: Cardiovascular;  Laterality: N/A;  ? TEE WITHOUT CARDIOVERSION N/A 01/17/2020  ? Procedure: TRANSESOPHAGEAL ECHOCARDIOGRAM (TEE);  Surgeon: ORexene Alberts MD;  [place] [facility]  Service: Open Heart Surgery;  Laterality: N/A;  ? TEE WITHOUT CARDIOVERSION N/A 01/26/2020  ? Procedure: TRANSESOPHAGEAL ECHOCARDIOGRAM (TEE);  Surgeon:  Geralynn Rile, MD;  Location: [place];  Service: Cardiovascular;  Laterality: N/A;  ? TUBAL LIGATION    ? VAGINAL HYSTERECTOMY    ? TVH  ? ? ?Current Medications: ?Current Meds  ?Medication Sig  ? acetaminophen (TYLENOL) 500 MG tablet Take 500 mg by mouth every 6 (six) hours as needed for moderate pain.   ? amLODipine (NORVASC) 5 MG tablet Take 1 tablet by mouth once daily  ? apixaban (ELIQUIS) 5 MG TABS tablet Take 1 tablet (5 mg total) by mouth 2 (two) times daily.  ? b complex vitamins tablet Take 1 tablet by mouth daily.  ? Calcium Carbonate Antacid (TUMS CHEWY BITES PO) Take 1 tablet by mouth daily as needed (reflux).   ? Calcium Citrate-Vitamin D (CALCIUM + D PO) Take 1 tablet by mouth daily.  ? Carboxymethylcellul-Glycerin (LUBRICATING EYE DROPS OP) Place 1 drop into both eyes daily as needed (dry eyes).  ? clonazePAM (KLONOPIN) 0.5 MG tablet Take 1 tablet by mouth  twice daily as needed for anxiety  ? cyclobenzaprine (FLEXERIL) 10 MG tablet Take 10 mg by mouth at bedtime as needed for muscle spasms.   ? denosumab (PROLIA) 60 MG/ML SOSY injection Inject 60 mg into the ski

## 2021-09-26 ENCOUNTER — Ambulatory Visit (INDEPENDENT_AMBULATORY_CARE_PROVIDER_SITE_OTHER): Payer: Medicare Other | Admitting: Cardiology

## 2021-09-26 ENCOUNTER — Encounter: Payer: Self-pay | Admitting: Cardiology

## 2021-09-26 ENCOUNTER — Other Ambulatory Visit: Payer: Self-pay

## 2021-09-26 VITALS — BP 144/80 | HR 73 | Ht 66.0 in | Wt 118.0 lb

## 2021-09-26 DIAGNOSIS — R0600 Dyspnea, unspecified: Secondary | ICD-10-CM | POA: Diagnosis not present

## 2021-09-26 DIAGNOSIS — I340 Nonrheumatic mitral (valve) insufficiency: Secondary | ICD-10-CM

## 2021-09-26 DIAGNOSIS — I5032 Chronic diastolic (congestive) heart failure: Secondary | ICD-10-CM

## 2021-09-26 DIAGNOSIS — Z9889 Other specified postprocedural states: Secondary | ICD-10-CM

## 2021-09-26 DIAGNOSIS — I878 Other specified disorders of veins: Secondary | ICD-10-CM

## 2021-09-26 DIAGNOSIS — I480 Paroxysmal atrial fibrillation: Secondary | ICD-10-CM | POA: Diagnosis not present

## 2021-09-26 DIAGNOSIS — Z8679 Personal history of other diseases of the circulatory system: Secondary | ICD-10-CM

## 2021-09-26 NOTE — Patient Instructions (Signed)
Medication Instructions:  ? ?Your physician recommends that you continue on your current medications as directed. Please refer to the Current Medication list given to you today. ? ?*If you need a refill on your cardiac medications before your next appointment, please call your pharmacy* ? ? ?Lab Work: ? ?TODAY--PRO-BNP ? ?If you have labs (blood work) drawn today and your tests are completely normal, you will receive your results only by: ?MyChart Message (if you have MyChart) OR ?A paper copy in the mail ?If you have any lab test that is abnormal or we need to change your treatment, we will call you to review the results. ? ? ? ?Follow-Up: ?At [facility], you and your health needs are our priority.  As part of our continuing mission to provide you with exceptional heart care, we have created designated Provider Care Teams.  These Care Teams include your primary Cardiologist (physician) and Advanced Practice Providers (APPs -  Physician Assistants and Nurse Practitioners) who all work together to provide you with the care you need, when you need it. ? ?We recommend signing up for the patient portal called "MyChart".  Sign up information is provided on this After Visit Summary.  MyChart is used to connect with patients for Virtual Visits (Telemedicine).  Patients are able to view lab/test results, encounter notes, upcoming appointments, etc.  Non-urgent messages can be sent to your provider as well.   ?To learn more about what you can do with MyChart, go to [email or web address].   ? ?Your next appointment:   ?6 month(s) ? ?The format for your next appointment:   ?In Person ? ?Provider:   ?Freada Bergeron, MD { ? ? ? ?

## 2021-09-27 ENCOUNTER — Telehealth: Payer: Self-pay | Admitting: *Deleted

## 2021-09-27 LAB — PRO B NATRIURETIC PEPTIDE: NT-Pro BNP: 1050 pg/mL — ABNORMAL HIGH (ref 0–738)

## 2021-09-27 MED ORDER — FUROSEMIDE 20 MG PO TABS: 20.0000 mg | ORAL_TABLET | Freq: Every day | ORAL | 1 refills | Status: DC

## 2021-09-27 NOTE — Telephone Encounter (Signed)
The patient has been notified of the result and verbalized understanding.  All questions (if any) were answered. ? ?Pt aware to increase her lasix to 20 mg po bid x 3 days then decrease to 20 mg po daily thereafter.  Pt aware to dry weigh herself everyday and take an extra lasix if weight parameters endorsed to her are met. ?Pt states she weighs herself every morning and her weight stays at 117 lbs.   ?Pt states she does not want [place] to send in lasix to her pharmacy for she has plenty on hand.  Reflected this in her med list.  ?Pt verbalized understanding and agrees with this plan. ? ?

## 2021-09-27 NOTE — Telephone Encounter (Signed)
-----   Message from Freada Bergeron, MD sent at 09/27/2021  6:47 AM EDT ----- ?Her BNP is slightly elevated. Would recommend taking lasix '20mg'$  BID for 3 days and then back to '20mg'$  daily thereafter. She should watch her weights closely and if she begins to gain weight (>2lbs in 1-2 days or 3-5lbs in 3-5 days), she should take an extra dose of lasix at that time until her weight is back to baseline. ?

## 2021-10-01 ENCOUNTER — Encounter: Payer: Self-pay | Admitting: Cardiology

## 2021-10-01 ENCOUNTER — Ambulatory Visit
Admission: RE | Admit: 2021-10-01 | Discharge: 2021-10-01 | Disposition: A | Discharge status: Home / Self Care | Payer: Medicare Other | Source: Ambulatory Visit | Attending: Cardiology | Admitting: Cardiology

## 2021-10-01 DIAGNOSIS — R0602 Shortness of breath: Secondary | ICD-10-CM

## 2021-10-01 NOTE — Telephone Encounter (Signed)
Chest x-ray ordered and pt is aware to go to [facility] to have this done today or tomorrow.  Mychart message sent with this information and address of [facility].  ?

## 2021-10-03 ENCOUNTER — Telehealth: Payer: Self-pay | Admitting: *Deleted

## 2021-10-03 DIAGNOSIS — Z79899 Other long term (current) drug therapy: Secondary | ICD-10-CM

## 2021-10-03 DIAGNOSIS — R0602 Shortness of breath: Secondary | ICD-10-CM

## 2021-10-03 NOTE — Telephone Encounter (Signed)
The patient has been notified of the result and verbalized understanding.  All questions (if any) were answered. ?Darrell Jewel, RN 10/03/2021 1:53 PM  ?  ?BMP ordered and scheduled. ?

## 2021-10-03 NOTE — Telephone Encounter (Signed)
-----   Message from Freada Bergeron, MD sent at 10/03/2021  1:43 PM EDT ----- ?Her chest xray looks good with no fluid in her lungs. If she is continuing to feel short of breath, she can do 3 extra days of the twice daily lasix with plan for repeat BMET in 1 week.  ?

## 2021-10-08 ENCOUNTER — Other Ambulatory Visit: Payer: Self-pay | Admitting: Family Medicine

## 2021-10-08 DIAGNOSIS — E785 Hyperlipidemia, unspecified: Secondary | ICD-10-CM

## 2021-10-09 ENCOUNTER — Other Ambulatory Visit: Payer: Medicare Other | Admitting: *Deleted

## 2021-10-09 ENCOUNTER — Ambulatory Visit: Payer: Medicare Other | Admitting: Family Medicine

## 2021-10-09 DIAGNOSIS — L821 Other seborrheic keratosis: Secondary | ICD-10-CM | POA: Diagnosis not present

## 2021-10-09 DIAGNOSIS — Z79899 Other long term (current) drug therapy: Secondary | ICD-10-CM

## 2021-10-09 DIAGNOSIS — Z85820 Personal history of malignant melanoma of skin: Secondary | ICD-10-CM | POA: Diagnosis not present

## 2021-10-09 DIAGNOSIS — L820 Inflamed seborrheic keratosis: Secondary | ICD-10-CM | POA: Diagnosis not present

## 2021-10-09 DIAGNOSIS — D045 Carcinoma in situ of skin of trunk: Secondary | ICD-10-CM | POA: Diagnosis not present

## 2021-10-09 DIAGNOSIS — L814 Other melanin hyperpigmentation: Secondary | ICD-10-CM | POA: Diagnosis not present

## 2021-10-09 DIAGNOSIS — D485 Neoplasm of uncertain behavior of skin: Secondary | ICD-10-CM | POA: Diagnosis not present

## 2021-10-09 DIAGNOSIS — R0602 Shortness of breath: Secondary | ICD-10-CM

## 2021-10-10 ENCOUNTER — Ambulatory Visit (INDEPENDENT_AMBULATORY_CARE_PROVIDER_SITE_OTHER): Payer: Medicare Other

## 2021-10-10 DIAGNOSIS — Z0000 Encounter for general adult medical examination without abnormal findings: Secondary | ICD-10-CM

## 2021-10-10 LAB — BASIC METABOLIC PANEL
BUN/Creatinine Ratio: 27 (ref 12–28)
BUN: 18 mg/dL (ref 8–27)
CO2: 30 mmol/L — ABNORMAL HIGH (ref 20–29)
Calcium: 9.5 mg/dL (ref 8.7–10.3)
Chloride: 98 mmol/L (ref 96–106)
Creatinine, Ser: 0.67 mg/dL (ref 0.57–1.00)
Glucose: 114 mg/dL — ABNORMAL HIGH (ref 70–99)
Potassium: 4.3 mmol/L (ref 3.5–5.2)
Sodium: 139 mmol/L (ref 134–144)
eGFR: 90 mL/min/{1.73_m2} (ref >59)

## 2021-10-10 NOTE — Patient Instructions (Signed)
[name] , ?Thank you for taking time to come for your Medicare Wellness Visit. I appreciate your ongoing commitment to your health goals. Please review the following plan we discussed and let me know if I can assist you in the future.  ? ?Screening recommendations/referrals: ?Colonoscopy: no longer required  ?Mammogram: no longer required  ?Bone Density: 07/03/2020 ?Recommended yearly ophthalmology/optometry visit for glaucoma screening and checkup ?Recommended yearly dental visit for hygiene and checkup ? ?Vaccinations: ?Influenza vaccine: completed  ?Pneumococcal vaccine: completed  ?Tdap vaccine: 08/02/2020 ?Shingles vaccine: completed    ? ?Advanced directives: yes  ? ?Conditions/risks identified: none  ? ?Next appointment: none  ? ? ?Preventive Care 34 Years and Older, Female ?Preventive care refers to lifestyle choices and visits with your health care provider that can promote health and wellness. ?What does preventive care include? ?A yearly physical exam. This is also called an annual well check. ?Dental exams once or twice a year. ?Routine eye exams. Ask your health care provider how often you should have your eyes checked. ?Personal lifestyle choices, including: ?Daily care of your teeth and gums. ?Regular physical activity. ?Eating a healthy diet. ?Avoiding tobacco and drug use. ?Limiting alcohol use. ?Practicing safe sex. ?Taking low-dose aspirin every day. ?Taking vitamin and mineral supplements as recommended by your health care provider. ?What happens during an annual well check? ?The services and screenings done by your health care provider during your annual well check will depend on your age, overall health, lifestyle risk factors, and family history of disease. ?Counseling  ?Your health care provider may ask you questions about your: ?Alcohol use. ?Tobacco use. ?Drug use. ?Emotional well-being. ?Home and relationship well-being. ?Sexual activity. ?Eating habits. ?History of falls. ?Memory and  ability to understand (cognition). ?Work and work Statistician. ?Reproductive health. ?Screening  ?You may have the following tests or measurements: ?Height, weight, and BMI. ?Blood pressure. ?Lipid and cholesterol levels. These may be checked every 5 years, or more frequently if you are over 71 years old. ?Skin check. ?Lung cancer screening. You may have this screening every year starting at age 43 if you have a 30-pack-year history of smoking and currently smoke or have quit within the past 15 years. ?Fecal occult blood test (FOBT) of the stool. You may have this test every year starting at age 38. ?Flexible sigmoidoscopy or colonoscopy. You may have a sigmoidoscopy every 5 years or a colonoscopy every 10 years starting at age 79. ?Hepatitis C blood test. ?Hepatitis B blood test. ?Sexually transmitted disease (STD) testing. ?Diabetes screening. This is done by checking your blood sugar (glucose) after you have not eaten for a while (fasting). You may have this done every 1-3 years. ?Bone density scan. This is done to screen for osteoporosis. You may have this done starting at age 36. ?Mammogram. This may be done every 1-2 years. Talk to your health care provider about how often you should have regular mammograms. ?Talk with your health care provider about your test results, treatment options, and if necessary, the need for more tests. ?Vaccines  ?Your health care provider may recommend certain vaccines, such as: ?Influenza vaccine. This is recommended every year. ?Tetanus, diphtheria, and acellular pertussis (Tdap, Td) vaccine. You may need a Td booster every 10 years. ?Zoster vaccine. You may need this after age 59. ?Pneumococcal 13-valent conjugate (PCV13) vaccine. One dose is recommended after age 61. ?Pneumococcal polysaccharide (PPSV23) vaccine. One dose is recommended after age 56. ?Talk to your health care provider about which screenings and vaccines  you need and how often you need them. ?This information is  not intended to replace advice given to you by your health care provider. Make sure you discuss any questions you have with your health care provider. ?Document Released: 07/20/2015 Document Revised: 03/12/2016 Document Reviewed: 04/24/2015 ?Elsevier Interactive Patient Education ? 2017 [place]. ? ?Fall Prevention in the Home ?Falls can cause injuries. They can happen to people of all ages. There are many things you can do to make your home safe and to help prevent falls. ?What can I do on the outside of my home? ?Regularly fix the edges of walkways and driveways and fix any cracks. ?Remove anything that might make you trip as you walk through a door, such as a raised step or threshold. ?Trim any bushes or trees on the path to your home. ?Use bright outdoor lighting. ?Clear any walking paths of anything that might make someone trip, such as rocks or tools. ?Regularly check to see if handrails are loose or broken. Make sure that both sides of any steps have handrails. ?Any raised decks and porches should have guardrails on the edges. ?Have any leaves, snow, or ice cleared regularly. ?Use sand or salt on walking paths during winter. ?Clean up any spills in your garage right away. This includes oil or grease spills. ?What can I do in the bathroom? ?Use night lights. ?Install grab bars by the toilet and in the tub and shower. Do not use towel bars as grab bars. ?Use non-skid mats or decals in the tub or shower. ?If you need to sit down in the shower, use a plastic, non-slip stool. ?Keep the floor dry. Clean up any water that spills on the floor as soon as it happens. ?Remove soap buildup in the tub or shower regularly. ?Attach bath mats securely with double-sided non-slip rug tape. ?Do not have throw rugs and other things on the floor that can make you trip. ?What can I do in the bedroom? ?Use night lights. ?Make sure that you have a light by your bed that is easy to reach. ?Do not use any sheets or blankets that  are too big for your bed. They should not hang down onto the floor. ?Have a firm chair that has side arms. You can use this for support while you get dressed. ?Do not have throw rugs and other things on the floor that can make you trip. ?What can I do in the kitchen? ?Clean up any spills right away. ?Avoid walking on wet floors. ?Keep items that you use a lot in easy-to-reach places. ?If you need to reach something above you, use a strong step stool that has a grab bar. ?Keep electrical cords out of the way. ?Do not use floor polish or wax that makes floors slippery. If you must use wax, use non-skid floor wax. ?Do not have throw rugs and other things on the floor that can make you trip. ?What can I do with my stairs? ?Do not leave any items on the stairs. ?Make sure that there are handrails on both sides of the stairs and use them. Fix handrails that are broken or loose. Make sure that handrails are as long as the stairways. ?Check any carpeting to make sure that it is firmly attached to the stairs. Fix any carpet that is loose or worn. ?Avoid having throw rugs at the top or bottom of the stairs. If you do have throw rugs, attach them to the floor with carpet tape. ?Make sure  that you have a light switch at the top of the stairs and the bottom of the stairs. If you do not have them, ask someone to add them for you. ?What else can I do to help prevent falls? ?Wear shoes that: ?Do not have high heels. ?Have rubber bottoms. ?Are comfortable and fit you well. ?Are closed at the toe. Do not wear sandals. ?If you use a stepladder: ?Make sure that it is fully opened. Do not climb a closed stepladder. ?Make sure that both sides of the stepladder are locked into place. ?Ask someone to hold it for you, if possible. ?Clearly mark and make sure that you can see: ?Any grab bars or handrails. ?First and last steps. ?Where the edge of each step is. ?Use tools that help you move around (mobility aids) if they are needed. These  include: ?Canes. ?Walkers. ?Scooters. ?Crutches. ?Turn on the lights when you go into a dark area. Replace any light bulbs as soon as they burn out. ?Set up your furniture so you have a clear path. Avoid mo

## 2021-10-10 NOTE — Progress Notes (Signed)
? ?Subjective:  ? [name] is a 76 y.o. female who presents for Medicare Annual (Subsequent) preventive examination. ? ? ?I connected with Venetia Night today by telephone and verified that I am speaking with the correct person using two identifiers. ?Location patient: home ?Location provider: work ?Persons participating in the virtual visit: patient, provider. ?  ?I discussed the limitations, risks, security and privacy concerns of performing an evaluation and management service by telephone and the availability of in person appointments. I also discussed with the patient that there may be a patient responsible charge related to this service. The patient expressed understanding and verbally consented to this telephonic visit.  ?  ?Interactive audio and video telecommunications were attempted between this provider and patient, however failed, due to patient having technical difficulties OR patient did not have access to video capability.  We continued and completed visit with audio only. ? ?  ?Review of Systems    ? ?Cardiac Risk Factors include: advanced age (>28mn, >>46women);hypertension ? ?   ?Objective:  ?  ?Today's Vitals  ? ?There is no height or weight on file to calculate BMI. ? ? ?  10/10/2021  ?  1:43 PM 05/07/2020  ?  3:51 PM 02/20/2020  ?  4:00 PM 02/03/2020  ?  9:06 AM 02/02/2020  ?  4:00 PM 01/21/2020  ?  9:00 AM 01/13/2020  ? 11:23 AM  ?Advanced Directives  ?Does Patient Have a Medical Advance Directive? No No No No  No No  ?Would patient like information on creating a medical advance directive? No - Patient declined No - Patient declined No - Patient declined  No - Patient declined No - Patient declined Yes (MAU/Ambulatory/Procedural Areas - Information given)  ? ? ?Current Medications (verified) ?Outpatient Encounter Medications as of 10/10/2021  ?Medication Sig  ? acetaminophen (TYLENOL) 500 MG tablet Take 500 mg by mouth every 6 (six) hours as needed for moderate pain.   ? amLODipine (NORVASC) 5 MG tablet  Take 1 tablet by mouth once daily  ? apixaban (ELIQUIS) 5 MG TABS tablet Take 1 tablet (5 mg total) by mouth 2 (two) times daily.  ? b complex vitamins tablet Take 1 tablet by mouth daily.  ? Calcium Carbonate Antacid (TUMS CHEWY BITES PO) Take 1 tablet by mouth daily as needed (reflux).   ? Calcium Citrate-Vitamin D (CALCIUM + D PO) Take 1 tablet by mouth daily.  ? Carboxymethylcellul-Glycerin (LUBRICATING EYE DROPS OP) Place 1 drop into both eyes daily as needed (dry eyes).  ? clonazePAM (KLONOPIN) 0.5 MG tablet Take 1 tablet by mouth twice daily as needed for anxiety  ? cyclobenzaprine (FLEXERIL) 10 MG tablet Take 10 mg by mouth at bedtime as needed for muscle spasms.   ? denosumab (PROLIA) 60 MG/ML SOSY injection Inject 60 mg into the skin every 6 (six) months.  ? furosemide (LASIX) 20 MG tablet Take 1 tablet (20 mg total) by mouth daily. If you gain weight (>2lbs in 1-2 days or 3-5lbs in 3-5 days), you may take an extra dose of lasix at that time until your weight is back to baseline.  ? levothyroxine (SYNTHROID) 75 MCG tablet Take 1 tablet by mouth once daily  ? meclizine (ANTIVERT) 25 MG tablet TAKE 1 TABLET BY MOUTH THREE TIMES DAILY AS NEEDED FOR DIZZINESS  ? metoprolol tartrate (LOPRESSOR) 50 MG tablet Take 1 tablet (50 mg total) by mouth 2 (two) times daily.  ? Nutritional Supplements (,FEEDING SUPPLEMENT, PROSOURCE PLUS) liquid Take 30  mLs by mouth 2 (two) times daily between meals.  ? nystatin (MYCOSTATIN/NYSTOP) powder Apply topically 2 (two) times daily as needed (skin irritation (under breasts)).  ? ondansetron (ZOFRAN) 4 MG tablet Take 1 tablet (4 mg total) by mouth every 8 (eight) hours as needed.  ? oxyCODONE (ROXICODONE) 5 MG/5ML solution Take 4 mLs (4 mg total) by mouth every 4 (four) hours as needed for moderate pain.  ? polyethylene glycol (MIRALAX / GLYCOLAX) packet Take 17 g by mouth daily as needed for mild constipation.  ? promethazine (PHENERGAN) 25 MG tablet Take 1 tablet (25 mg total)  by mouth 2 (two) times daily as needed for nausea or vomiting.  ? sodium chloride (OCEAN) 0.65 % SOLN nasal spray Place 1 spray into both nostrils as needed for congestion (nose bleeds).  ? triamcinolone ointment (KENALOG) 0.1 % Apply 1 application topically 2 (two) times daily.  ? valACYclovir (VALTREX) 500 MG tablet Take 500 mg by mouth daily as needed (breakouts).   ? doxycycline (VIBRA-TABS) 100 MG tablet Take 1 tablet (100 mg total) by mouth 2 (two) times daily.  ? sertraline (ZOLOFT) 25 MG tablet Take 1 tablet (25 mg total) by mouth daily.  ? ?No facility-administered encounter medications on file as of 10/10/2021.  ? ? ?Allergies (verified) ?Buprenorphine, Cymbalta [duloxetine hcl], Erythromycin, Hydrocodone, Hydromorphone, Iodinated contrast media, Iodine, Levofloxacin, Metrizamide, Nsaids, Nucynta [tapentadol hcl], Nucynta [tapentadol], Other, Oxycodone, Pentazocine, Septra [bactrim], Sulfasalazine, Adhesive [tape], Latex, Morphine, and Sulfa antibiotics  ? ?History: ?Past Medical History:  ?Diagnosis Date  ? Allergies   ? Anxiety   ? Arthritis   ? "maybe in my back" (03/31/2018)  ? Benign paroxysmal positional vertigo 06/08/2013  ? Complication of anesthesia   ? Fracture of multiple ribs 2015  ? "don't know from what; dx'd when I in hospital for 1st back OR" (03/31/2018)  ? GERD (gastroesophageal reflux disease)   ? Hair loss 04/12/2012  ? Herpes   ? History of blood transfusion   ? "twice; related to back OR" (03/31/2018)  ? History of kidney stones   ? Interstitial cystitis 11/06/2011  ? 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 "right"  ? Mitral regurgitation   ? Osteopenia 02/18/2012  ? Osteoporosis   ? PAF (paroxysmal atrial fibrillation) ([place]) 2012  ? Peripheral neuropathy 11/06/2011  ? PMDD (premenstrual dysphoric disorder)   ? PONV (postoperative nausea and vomiting)   ? nausea, vomiting, hives and dizziness   ? 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/2020  ? Complete bilateral atrial lesion set using  cryothermy and bipolar radiofrequency ablation with clipping of LA appendage via right mini-thoracotomy approach  ? 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 Complex valvuloplasty including artificial Gore-tex neochord placement x12 with 65m Sorin Memo 4D ring annuloplasty  ? Seasonal allergies   ? Vaginal delivery   ? ONE NSVD  ? 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 ?Past Surgical History:  ?Procedure Laterality Date  ? ANTERIOR CERVICAL DECOMP/DISCECTOMY FUSION  ~ 2003  ? BACK SURGERY    ? BREAST SURGERY    ? BREAST BIOPSY--RIGHT BENIGN  ? BUNIONECTOMY Bilateral   ? CARDIOVERSION N/A 01/26/2020  ? Procedure: CARDIOVERSION;  Surgeon: OGeralynn Rile MD;  Location: [facility]  Service: Cardiovascular;  Laterality: N/A;  ? COSMETIC SURGERY  2016  ? "back of my neck; related to earlier fusion"  ? CYSTOSCOPY W/ STONE MANIPULATION  "several times"  ? DILATION AND CURETTAGE OF UTERUS    ? FOREHEAD RECONSTRUCTION Right   ? "  removed bone protruding out of my forehead"  ? HARDWARE REMOVAL  2016  ? "related to neck OR"  ? INCONTINENCE SURGERY    ? IR THORACENTESIS ASP PLEURAL SPACE W/IMG GUIDE  02/16/2020  ? MINIMALLY INVASIVE MAZE PROCEDURE N/A 01/17/2020  ? Procedure: MINIMALLY INVASIVE MAZE PROCEDURE;  Surgeon: Rexene Alberts, MD;  Location: [facility];  Service: Open Heart Surgery;  Laterality: N/A;  ? MITRAL VALVE REPAIR Right 01/17/2020  ? Procedure: MINIMALLY INVASIVE MITRAL VALVE REPAIR (M[facility]) USING MEMO 4D 32MM;  Surgeon: Rexene Alberts, MD;  Location: [facility];  Service: Open Heart Surgery;  Laterality: Right;  ? POSTERIOR CERVICAL FUSION/FORAMINOTOMY  ~ 2008; 2015  ? RIGHT/LEFT HEART CATH AND CORONARY ANGIOGRAPHY N/A 12/02/2019  ? Procedure: RIGHT/LEFT HEART CATH AND CORONARY ANGIOGRAPHY;  Surgeon: Burnell Blanks, MD;  Location: [facility];  Service: Cardiovascular;  Laterality: N/A;  ? SHOULDER ARTHROSCOPY W/ ROTATOR CUFF REPAIR Right 2012  ? SPINAL FUSION  06/2014 - 2018 X ?7  ? "scoliosis; my entire back"  ?  TEE WITHOUT CARDIOVERSION N/A 11/21/2019  ? Procedure: TRANSESOPHAGEAL ECHOCARDIOGRAM (TEE);  Surgeon: Josue Hector, MD;  Location: [facility];  Service: Cardiovascular;  Laterality: N/A;  ? TEE [place]

## 2021-10-11 ENCOUNTER — Telehealth: Payer: Self-pay | Admitting: Cardiology

## 2021-10-11 NOTE — Telephone Encounter (Signed)
Patient is reaching out wanting a phone call with lab results... please advise  ?

## 2021-10-11 NOTE — Telephone Encounter (Signed)
Freada Bergeron, MD  ?10/10/2021  7:04 PM EDT   ?  ?Kidney function and electrolytes look great  ? ? ?The patient has been notified of the result and verbalized understanding.  All questions (if any) were answered. Nuala Alpha, LPN 0/09/1279 18:86 AM  ? ?

## 2021-10-14 ENCOUNTER — Encounter: Payer: Self-pay | Admitting: Cardiology

## 2021-10-16 ENCOUNTER — Ambulatory Visit: Payer: Medicare Other | Admitting: Family Medicine

## 2021-10-17 DIAGNOSIS — R000 Tachycardia, unspecified: Secondary | ICD-10-CM | POA: Insufficient documentation

## 2021-10-17 DIAGNOSIS — M25511 Pain in right shoulder: Secondary | ICD-10-CM | POA: Diagnosis not present

## 2021-10-24 DIAGNOSIS — L820 Inflamed seborrheic keratosis: Secondary | ICD-10-CM | POA: Diagnosis not present

## 2021-10-24 DIAGNOSIS — Z85820 Personal history of malignant melanoma of skin: Secondary | ICD-10-CM | POA: Diagnosis not present

## 2021-10-24 DIAGNOSIS — Z85828 Personal history of other malignant neoplasm of skin: Secondary | ICD-10-CM | POA: Diagnosis not present

## 2021-10-31 ENCOUNTER — Ambulatory Visit (INDEPENDENT_AMBULATORY_CARE_PROVIDER_SITE_OTHER): Payer: Medicare Other | Admitting: Family Medicine

## 2021-10-31 ENCOUNTER — Encounter: Payer: Self-pay | Admitting: Family Medicine

## 2021-10-31 ENCOUNTER — Encounter: Payer: Self-pay | Admitting: *Deleted

## 2021-10-31 VITALS — BP 112/74 | HR 74 | Temp 97.0°F | Resp 18 | Ht 66.0 in | Wt 114.4 lb

## 2021-10-31 DIAGNOSIS — E039 Hypothyroidism, unspecified: Secondary | ICD-10-CM

## 2021-10-31 DIAGNOSIS — F419 Anxiety disorder, unspecified: Secondary | ICD-10-CM

## 2021-10-31 DIAGNOSIS — F32A Depression, unspecified: Secondary | ICD-10-CM | POA: Diagnosis not present

## 2021-10-31 DIAGNOSIS — E43 Unspecified severe protein-calorie malnutrition: Secondary | ICD-10-CM | POA: Diagnosis not present

## 2021-10-31 LAB — BASIC METABOLIC PANEL
BUN: 22 mg/dL (ref 6–23)
CO2: 36 mEq/L — ABNORMAL HIGH (ref 19–32)
Calcium: 10.1 mg/dL (ref 8.4–10.5)
Chloride: 100 mEq/L (ref 96–112)
Creatinine, Ser: 0.76 mg/dL (ref 0.40–1.20)
GFR: 75.67 mL/min (ref >60.00)
Glucose, Bld: 80 mg/dL (ref 70–99)
Potassium: 3.9 mEq/L (ref 3.5–5.1)
Sodium: 144 mEq/L (ref 135–145)

## 2021-10-31 LAB — CBC WITH DIFFERENTIAL/PLATELET
Basophils Absolute: 0 10*3/uL (ref 0.0–0.1)
Basophils Relative: 0.4 % (ref 0.0–3.0)
Eosinophils Absolute: 0.1 10*3/uL (ref 0.0–0.7)
Eosinophils Relative: 2.2 % (ref 0.0–5.0)
HCT: 40.6 % (ref 36.0–46.0)
Hemoglobin: 13.4 g/dL (ref 12.0–15.0)
Lymphocytes Relative: 22.3 % (ref 12.0–46.0)
Lymphs Abs: 1.5 10*3/uL (ref 0.7–4.0)
MCHC: 33 g/dL (ref 30.0–36.0)
MCV: 89.9 fl (ref 78.0–100.0)
Monocytes Absolute: 0.5 10*3/uL (ref 0.1–1.0)
Monocytes Relative: 7.5 % (ref 3.0–12.0)
Neutro Abs: 4.5 10*3/uL (ref 1.4–7.7)
Neutrophils Relative %: 67.6 % (ref 43.0–77.0)
Platelets: 187 10*3/uL (ref 150.0–400.0)
RBC: 4.52 Mil/uL (ref 3.87–5.11)
RDW: 14.3 % (ref 11.5–15.5)
WBC: 6.6 10*3/uL (ref 4.0–10.5)

## 2021-10-31 LAB — HEPATIC FUNCTION PANEL
ALT: 22 U/L (ref 0–35)
AST: 25 U/L (ref 0–37)
Albumin: 4.5 g/dL (ref 3.5–5.2)
Alkaline Phosphatase: 70 U/L (ref 39–117)
Bilirubin, Direct: 0.1 mg/dL (ref 0.0–0.3)
Total Bilirubin: 0.5 mg/dL (ref 0.2–1.2)
Total Protein: 7.3 g/dL (ref 6.0–8.3)

## 2021-10-31 LAB — TSH: TSH: 0.36 u[IU]/mL (ref 0.35–5.50)

## 2021-10-31 MED ORDER — MIRTAZAPINE 7.5 MG PO TABS: 7.5000 mg | ORAL_TABLET | Freq: Every day | ORAL | 3 refills | Status: DC

## 2021-10-31 NOTE — Progress Notes (Signed)
? ?  Subjective:  ? ? Patient ID: [name], female    DOB: Feb 05, 1946, 76 y.o.   MRN: [id number] ? ?HPI ?Hypothyroid- pt is currently on Levothyroxine 8mg daily. ? ?Anxiety/depression- pt is seeing counselor (Shanon Brow but sessions have been on hold due to both pt and provider COVID infxn.  Pt takes Clonopin as needed.  Feels she needs 'something else to get through'.  Pt continues to have multiple medical issues which is very stressful.  'i can't do it no more.  I've got to get some help'.  Pt is worried about 'what's going to happen to you' b/c she lives alone.  She reports son is 'very very ill'. ? ?Protein Calorie Malnutrition- pt has lost 5 lb in 3 months.  Pt has not been drinking Ensure but she has a large supply. ? ? ?Review of Systems ?For ROS see HPI  ?   ?Objective:  ? Physical Exam ?Vitals reviewed.  ?Constitutional:   ?   General: She is not in acute distress. ?   Appearance: She is ill-appearing.  ?   Comments: Cachectic, frail  ?HENT:  ?   Head: Normocephalic and atraumatic.  ?Musculoskeletal:  ?   Right lower leg: No edema.  ?   Left lower leg: No edema.  ?Skin: ?   General: Skin is warm and dry.  ?Neurological:  ?   General: No focal deficit present.  ?   Mental Status: She is alert and oriented to person, place, and time.  ?Psychiatric:  ?   Comments: Anxious, tearful  ? ? ? ? ? ?   ?Assessment & Plan:  ? ? ?

## 2021-10-31 NOTE — Patient Instructions (Addendum)
Follow up in 4-6 weeks to recheck mood ?We'll notify you of your lab results and make any changes if needed ?START the Mirtazapine nightly for anxiety.  This is a very low dose and we can adjust it as needed ?Make sure you are drinking 1-2 Ensure daily ?Try and eat regularly- at least 3x/day ?Call with any questions or concerns ?Hang in there!!! ?

## 2021-11-01 ENCOUNTER — Encounter: Payer: Self-pay | Admitting: Family Medicine

## 2021-11-01 NOTE — Telephone Encounter (Signed)
Not sure why this got sent to me! ? ?Thanks, ? ?Rich

## 2021-11-03 NOTE — Assessment & Plan Note (Signed)
Deteriorated.  Pt has been seeing a counselor but this was put on hold due to [place].  Take Clonopin as needed.  At this point feels she needs additional medication despite trying just about everything in the past.  She has a history of taking 1-2 doses and then reporting side effects and stopping medication before she has a chance to adjust or it has time to work.  Since she has continued to lose weight and needs assistance w/ depression, will start Remeron and monitor for improvement. ?

## 2021-11-03 NOTE — Assessment & Plan Note (Signed)
Check labs as pt continues to lose weight and adjust meds prn. ?

## 2021-11-03 NOTE — Assessment & Plan Note (Signed)
Deteriorated.  Pt is down another 5 lbs.  She has not been drinking Ensure despite having a large supply at home.  Stressed need for her to eat regularly and include 1-2 Ensure daily ?

## 2021-11-05 ENCOUNTER — Emergency Department (INDEPENDENT_AMBULATORY_CARE_PROVIDER_SITE_OTHER)
Admission: RE | Admit: 2021-11-05 | Discharge: 2021-11-05 | Disposition: A | Discharge status: Home / Self Care | Payer: Medicare Other | Source: Ambulatory Visit

## 2021-11-05 ENCOUNTER — Emergency Department (INDEPENDENT_AMBULATORY_CARE_PROVIDER_SITE_OTHER): Payer: Medicare Other

## 2021-11-05 ENCOUNTER — Encounter: Payer: Self-pay | Admitting: Family Medicine

## 2021-11-05 ENCOUNTER — Telehealth: Payer: Self-pay | Admitting: Family Medicine

## 2021-11-05 VITALS — BP 111/70 | HR 84 | Temp 97.7°F | Resp 18

## 2021-11-05 DIAGNOSIS — M25562 Pain in left knee: Secondary | ICD-10-CM | POA: Diagnosis not present

## 2021-11-05 DIAGNOSIS — W19XXXA Unspecified fall, initial encounter: Secondary | ICD-10-CM

## 2021-11-05 NOTE — Discharge Instructions (Addendum)
Advised patient of left knee x-ray results this morning with hard copy provided.  Advised/encouraged patient may RICE left knee for 25 minutes 3 times daily for the next 3 days.  Advised may use previously prescribed oxycodone solution as needed.  Advised patient if symptoms worsen and/or unresolved please follow-up with [facility] orthopedic provider for further evaluation.  Contact information for this provider has been included with this AVS today. ?

## 2021-11-05 NOTE — Telephone Encounter (Signed)
Went to urgent care today  ?

## 2021-11-05 NOTE — Telephone Encounter (Signed)
Chief Complaint Knee Injury ?Reason for Call Symptomatic / Request for Health Information ?Initial Comment Caller turned around 2 quickly, lost her balance ?and hit her knee on the concrete, has a large knot ?on the left knee, patient is on blood thinner. ?Translation No ?Nurse Assessment ?Nurse: Geraldine Contras RN, [place] Date/Time (Eastern Time): 11/04/2021 8:26:50 PM ?Confirm and document reason for call. If ?symptomatic, describe symptoms. ?---Caller states that she turned around to quickly, ?fell on her knee on the concrete floor, bilateral legs, ?left knee hurts the most. bruise noted on the left and ?swelling. able to walk around, but still painful. denies ?hitting her head, neck or back. patient is on blood ?thinner. ?Does the patient have any new or worsening ?symptoms? ---Yes ?Will a triage be completed? ---Yes ?Related visit to physician within the last 2 weeks? ---No ?Does the PT have any chronic conditions? (i.e. ?diabetes, asthma, this includes High risk factors for ?pregnancy, etc.) ?---Yes ?List chronic conditions. ---neck surgery, back surgery, scoliosis, afib, MVP, ?Is this a behavioral health or substance abuse call? ---No ?Guidelines ?Guideline Title Affirmed Question Affirmed Notes Nurse Date/Time (Eastern ?Time) ?Knee Injury [1] High-risk adult ?(e.g., age > 99 years, ?osteoporosis, chronic ?Marmol, RN, ?[place] ?11/04/2021 8:31:11 PM ?PLEASE NOTE: All timestamps contained within this report are represented as [place] Standard Time. ?CONFIDENTIALTY NOTICE: This fax transmission is intended only for the addressee. It contains information that is legally privileged, confidential or ?otherwise protected from use or disclosure. If you are not the intended recipient, you are strictly prohibited from reviewing, disclosing, copying using ?or disseminating any of this information or taking any action in reliance on or regarding this information. If you have received this fax in error, please ?notify us  immediately by telephone so that we can arrange for its return to [place]. Phone: [phone], Toll-Free: [phone], Fax: [phone] ?Page: 2 of 2 ?Call Id: [id number] ?Guidelines ?Guideline Title Affirmed Question Affirmed Notes Nurse Date/Time (Eastern ?Time) ?steroid use) AND [2] ?limping ?Disp. Time (Eastern ?Time) Disposition Final User ?11/04/2021 8:42:24 PM See PCP within 24 Hours Yes Goehner, RN, [place] ?Caller Disagree/Comply Comply ?Caller Understands Yes ?PreDisposition Did not know what to do ?Care Advice Given Per Guideline ?SEE PCP WITHIN 24 HOURS: * IF OFFICE WILL BE OPEN: You need to be examined within the next 24 hours. Call your doctor ?(or NP/PA) when the office opens and make an appointment. USE A COLD PACK FOR PAIN, SWELLING, OR BRUISING: * Put ?a cold pack or an ice bag (wrapped in a moist towel) on the area for 20 minutes. * Repeat in 1 hour, then every 4 hours while awake. ?* This will help decrease pain, swelling, and bruising. * Caution: Avoid frostbite. USE HEAT ON AREA AFTER 48 HOURS: * If ?pain, swelling, or bruising last more than 48 hours (2 days), then use heat on the area. PAIN MEDICINES: * For pain relief, you can ?take either acetaminophen, ibuprofen, or naproxen. * ACETAMINOPHEN - EXTRA STRENGTH TYLENOL: Take 1,000 mg (two ?500 mg pills) every 6 to 8 hours as needed. Each Extra Strength Tylenol pill has 500 mg of acetaminophen. The most you should ?take is 6 pills a day (3,000 mg total). Note: In [place], the maximum is 8 pills a day (4,000 mg total). PAIN MEDICINES - EXTRA ?NOTES AND WARNINGS: * Follow these dosing instructions unless your doctor (or NP/PA) has told you to take a different dose. ?* Acetaminophen is thought to be safer than ibuprofen or naproxen in people over 51 years old. Acetaminophen  is in many OTC and ?prescription medicines. It might be in more than one medicine that you are taking. You need to be careful and not take an overdose. ?An acetaminophen overdose can  hurt the liver. CALL BACK IF: * Pain becomes severe * You become worse CARE ADVICE given ?per Knee Injury (Adult) guideline. ?Referrals ?REFERRED TO PCP OFFICE ?

## 2021-11-05 NOTE — ED Triage Notes (Signed)
Pt c/o LT knee pain since falling last night while down in her basement. Denies pain anywhere else. Did not hit head or lose consciousness. Pain 10/10 Oxycodone prn for other issues. Ice as well.  ?

## 2021-11-05 NOTE — ED Provider Notes (Signed)
?[place] ? ? ? ?CSN: [id number] ?Arrival date & time: 11/05/21  7858 ? ? ?  ? ?History   ?Chief Complaint ?Chief Complaint  ?Patient presents with  ? Knee Pain  ?  LT  ? ? ?HPI ?[name] is a 76 y.o. female.  ? ?HPI 76 year old female presents with left knee pain since falling last night while down in her basement.  Reports left knee pain as 10 of 10.  Patient reports taking previously prescribed oxycodone as needed as well as iced left knee.  PMH significant for paroxysmal atrial fibrillation, s/p mitral valve repair, and peripheral neuropathy. ? ?Past Medical History:  ?Diagnosis Date  ? Allergies   ? Anxiety   ? Arthritis   ? "maybe in my back" (03/31/2018)  ? Benign paroxysmal positional vertigo 06/08/2013  ? Complication of anesthesia   ? Fracture of multiple ribs 2015  ? "don't know from what; dx'd when I in hospital for 1st back OR" (03/31/2018)  ? GERD (gastroesophageal reflux disease)   ? Hair loss 04/12/2012  ? Herpes   ? History of blood transfusion   ? "twice; related to back OR" (03/31/2018)  ? History of kidney stones   ? Interstitial cystitis 11/06/2011  ? 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 "right"  ? Mitral regurgitation   ? Osteopenia 02/18/2012  ? Osteoporosis   ? PAF (paroxysmal atrial fibrillation) ([place]) 2012  ? Peripheral neuropathy 11/06/2011  ? PMDD (premenstrual dysphoric disorder)   ? PONV (postoperative nausea and vomiting)   ? nausea, vomiting, hives and dizziness   ? 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/2020  ? Complete bilateral atrial lesion set using cryothermy and bipolar radiofrequency ablation with clipping of LA appendage via right mini-thoracotomy approach  ? 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 Complex valvuloplasty including artificial Gore-tex neochord placement x12 with 44m Sorin Memo 4D ring annuloplasty  ? Seasonal allergies   ? Vaginal delivery   ? ONE NSVD  ? 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 ? ?Patient Active Problem List  ? Diagnosis Date Noted  ? HTN  (hypertension) 06/20/2021  ? Visit for wound check 04/29/2021  ? Therapeutic opioid-induced constipation (OIC) 04/03/2021  ? 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  ? Atrial fibrillation ([place] 02/09/2020  ? Long term (current) use of anticoagulants 02/09/2020  ? Acute blood loss anemia 02/09/2020  ? Debility 02/02/2020  ? 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/2020  ? Severe mitral regurgitation   ? Mitral regurgitation due to cusp prolapse   ? Ankle swelling, left 10/20/2019  ? Ankle swelling, right 10/20/2019  ? Secondary hypercoagulable state ([facility] 08/19/2019  ? Left inguinal hernia 03/11/2019  ? Visit for monitoring Tikosyn therapy 03/29/2018  ? Osteoporosis 09/23/2017  ? Fatigue 02/18/2017  ? Physical exam 04/21/2016  ? Positional lightheadedness 11/16/2015  ? B12 deficiency 10/01/2015  ? Hearing loss due to cerumen impaction 08/23/2015  ? Foot pain, right 05/23/2015  ? Hyperlipidemia 04/19/2015  ? Protein-calorie malnutrition ([place] 10/19/2014  ? Fracture of multiple ribs 08/07/2014  ? Tachycardia   ? Shortness of breath 08/06/2014  ? Constipation 07/14/2014  ? Vitamin B12 deficiency anemia, unspecified 06/19/2014  ? Hypokalemia 06/19/2014  ? Other chronic pain 06/19/2014  ? Polyneuropathy, unspecified 06/19/2014  ? Zoster without complications 185/08/7739 ? Vitreous degeneration, bilateral 06/19/2014  ? Nutritional deficiency, unspecified 06/19/2014  ? Nocturia 06/19/2014  ? Other symbolic dysfunctions 128/78/6767 ? Unsteadiness on feet 06/19/2014  ?  Unspecified osteoarthritis, unspecified site 06/19/2014  ? Muscle weakness (generalized) 06/19/2014  ? Major depressive disorder, single episode, unspecified 06/19/2014  ? Frequency of micturition 06/19/2014  ? Encounter for orthopedic aftercare following scoliosis surgery 06/19/2014  ? Cardiac arrhythmia, unspecified 06/19/2014  ? Age-related nuclear cataract, unspecified eye 06/19/2014  ? Scoliosis, unspecified 06/19/2014  ?  Lumbar back pain 05/19/2012  ? Ureteric stone 02/18/2012  ? Leg pain, bilateral 11/17/2011  ? Hereditary and idiopathic peripheral neuropathy 11/06/2011  ? S/P cervical spinal fusion 11/06/2011  ? Gastro-esophageal reflux disease without esophagitis 11/06/2011  ? 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 11/06/2011  ? Personal history of other diseases of the circulatory system 11/06/2011  ? Allergic rhinitis, unspecified 04/25/2010  ? 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 Anxiety and depression 03/04/2010  ? Other malaise and fatigue 02/05/2010  ? Vitamin D deficiency 01/28/2010  ? Neoplasm of uncertain behavior of skin 01/25/2010  ? Genital herpes 11/01/2009  ? Herpes simplex virus (HSV) infection 11/01/2009  ? Herpetic vulvovaginitis 10/23/2009  ? Kidney stone 08/09/2009  ? 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 Brachial neuritis or radiculitis 07/23/2009  ? ? ?Past Surgical History:  ?Procedure Laterality Date  ? ANTERIOR CERVICAL DECOMP/DISCECTOMY FUSION  ~ 2003  ? BACK SURGERY    ? BREAST SURGERY    ? BREAST BIOPSY--RIGHT BENIGN  ? BUNIONECTOMY Bilateral   ? CARDIOVERSION N/A 01/26/2020  ? Procedure: CARDIOVERSION;  Surgeon: Geralynn Rile, MD;  Location: [place];  Service: Cardiovascular;  Laterality: N/A;  ? COSMETIC SURGERY  2016  ? "back of my neck; related to earlier fusion"  ? CYSTOSCOPY W/ STONE MANIPULATION  "several times"  ? DILATION AND CURETTAGE OF UTERUS    ? FOREHEAD RECONSTRUCTION Right   ? "removed bone protruding out of my forehead"  ? HARDWARE REMOVAL  2016  ? "related to neck OR"  ? INCONTINENCE SURGERY    ? IR THORACENTESIS ASP PLEURAL SPACE W/IMG GUIDE  02/16/2020  ? MINIMALLY INVASIVE MAZE PROCEDURE N/A 01/17/2020  ? Procedure: MINIMALLY INVASIVE MAZE PROCEDURE;  Surgeon: Rexene Alberts, MD;  Location: [place];  Service: Open Heart Surgery;  Laterality: N/A;  ? MITRAL VALVE REPAIR Right 01/17/2020  ? Procedure: MINIMALLY INVASIVE MITRAL VALVE REPAIR (MVR)  USING MEMO 4D 32MM;  Surgeon: Rexene Alberts, MD;  Location: [place];  Service: Open Heart Surgery;  Laterality: Right;  ? POSTERIOR CERVICAL FUSION/FORAMINOTOMY  ~ 2008; 2015  ? RIGHT/LEFT HEART CATH AND CORONARY ANGIOGRAPHY N/A 12/02/2019  ? Procedure: RIGHT/LEFT HEART CATH AND CORONARY ANGIOGRAPHY;  Surgeon: Burnell Blanks, MD;  Location: [place] [facility];  Service: Cardiovascular;  Laterality: N/A;  ? SHOULDER ARTHROSCOPY W/ ROTATOR CUFF REPAIR Right 2012  ? SPINAL FUSION  06/2014 - 2018 X ?7  ? "scoliosis; my entire back"  ? TEE WITHOUT CARDIOVERSION N/A 11/21/2019  ? Procedure: TRANSESOPHAGEAL ECHOCARDIOGRAM (TEE);  Surgeon: Josue Hector, MD;  Location: [facility] ENDOSCOPY;  Service: Cardiovascular;  Laterality: N/A;  ? TEE WITHOUT CARDIOVERSION N/A 01/17/2020  ? Procedure: TRANSESOPHAGEAL ECHOCARDIOGRAM (TEE);  Surgeon: Rexene Alberts, MD;  Location: [place];  Service: Open Heart Surgery;  Laterality: N/A;  ? TEE WITHOUT CARDIOVERSION N/A 01/26/2020  ? Procedure: TRANSESOPHAGEAL ECHOCARDIOGRAM (TEE);  Surgeon: Geralynn Rile, MD;  Location: [place];  Service: Cardiovascular;  Laterality: N/A;  ? TUBAL LIGATION    ? VAGINAL HYSTERECTOMY    ? TVH  ? ? ?OB History   ? ? Gravida  ?1  ? Para  ?  1  ? Term  ?1  ? Preterm  ?   ? AB  ?   ? Living  ?1  ?  ? ? SAB  ?   ? IAB  ?   ? Ectopic  ?   ? Multiple  ?   ? Live Births  ?1  ?   ?  ?  ? ? ? ?Home Medications   ? ?Prior to Admission medications   ?Medication Sig Start Date End Date Taking? Authorizing Provider  ?acetaminophen (TYLENOL) 500 MG tablet Take 500 mg by mouth every 6 (six) hours as needed for moderate pain.     [provider]  ?amLODipine (NORVASC) 5 MG tablet Take 1 tablet by mouth once daily 07/10/21   Maximiano Coss, NP  ?apixaban (ELIQUIS) 5 MG TABS tablet Take 1 tablet (5 mg total) by mouth 2 (two) times daily. 08/27/21   Freada Bergeron, MD  ?b complex vitamins tablet Take 1 tablet by mouth daily.    [provider]  ?Calcium Carbonate Antacid (TUMS CHEWY BITES PO) Take 1 tablet by mouth daily as needed (reflux).     [provider]  ?Calcium Citrate-Vitamin D (CALCIUM + D PO) Take 1 tablet by mouth daily.    P

## 2021-11-05 NOTE — Telephone Encounter (Signed)
Pt currently at [facility] for xrays and evaluation ?

## 2021-11-07 ENCOUNTER — Encounter: Payer: Self-pay | Admitting: Family Medicine

## 2021-11-07 ENCOUNTER — Ambulatory Visit (INDEPENDENT_AMBULATORY_CARE_PROVIDER_SITE_OTHER): Payer: Medicare Other | Admitting: Family Medicine

## 2021-11-07 ENCOUNTER — Ambulatory Visit: Payer: Self-pay

## 2021-11-07 VITALS — BP 108/66 | Ht 66.0 in | Wt 114.0 lb

## 2021-11-07 DIAGNOSIS — S8002XA Contusion of left knee, initial encounter: Secondary | ICD-10-CM

## 2021-11-07 DIAGNOSIS — M25562 Pain in left knee: Secondary | ICD-10-CM

## 2021-11-07 DIAGNOSIS — S82122A Displaced fracture of lateral condyle of left tibia, initial encounter for closed fracture: Secondary | ICD-10-CM | POA: Insufficient documentation

## 2021-11-07 NOTE — Progress Notes (Signed)
?[name] - 76 y.o. female MRN [id number]  Date of birth: [birth year] ? ?SUBJECTIVE:  Including CC & ROS.  ?No chief complaint on file. ? ? ?[name] is a 76 y.o. female that is presenting with acute left knee pain.  She had a fall a few days ago.  She is having pain at the proximal tibia.  She is having swelling and ecchymosis in the area as well.  She is on Prolia for osteoporosis. ? ?Review of the urgent care note from 5/2 shows she was counseled on supportive care. ?Independent review of the left knee x-ray from 5/2 shows no acute changes. ? ? ?Review of Systems ?See HPI  ? ?HISTORY: Past Medical, Surgical, Social, and Family History Reviewed & Updated per EMR.   ?Pertinent Historical Findings include: ? ?Past Medical History:  ?Diagnosis Date  ? Allergies   ? Anxiety   ? Arthritis   ? "maybe in my back" (03/31/2018)  ? Benign paroxysmal positional vertigo 06/08/2013  ? Complication of anesthesia   ? Fracture of multiple ribs 2015  ? "don't know from what; dx'd when I in hospital for 1st back OR" (03/31/2018)  ? GERD (gastroesophageal reflux disease)   ? Hair loss 04/12/2012  ? Herpes   ? History of blood transfusion   ? "twice; related to back OR" (03/31/2018)  ? History of kidney stones   ? Interstitial cystitis 11/06/2011  ? 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 "right"  ? Mitral regurgitation   ? Osteopenia 02/18/2012  ? Osteoporosis   ? PAF (paroxysmal atrial fibrillation) ([place]) 2012  ? Peripheral neuropathy 11/06/2011  ? PMDD (premenstrual dysphoric disorder)   ? PONV (postoperative nausea and vomiting)   ? nausea, vomiting, hives and dizziness   ? 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/2020  ? Complete bilateral atrial lesion set using cryothermy and bipolar radiofrequency ablation with clipping of LA appendage via right mini-thoracotomy approach  ? 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 Complex valvuloplasty including artificial Gore-tex neochord placement x12 with 55m Sorin Memo 4D ring  annuloplasty  ? Seasonal allergies   ? Vaginal delivery   ? ONE NSVD  ? 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 ? ?Past Surgical History:  ?Procedure Laterality Date  ? ANTERIOR CERVICAL DECOMP/DISCECTOMY FUSION  ~ 2003  ? BACK SURGERY    ? BREAST SURGERY    ? BREAST BIOPSY--RIGHT BENIGN  ? BUNIONECTOMY Bilateral   ? CARDIOVERSION N/A 01/26/2020  ? Procedure: CARDIOVERSION;  Surgeon: OGeralynn Rile MD;  [place]  Service: Cardiovascular;  Laterality: N/A;  ? COSMETIC SURGERY  2016  ? "back of my neck; related to earlier fusion"  ? CYSTOSCOPY W/ STONE MANIPULATION  "several times"  ? DILATION AND CURETTAGE OF UTERUS    ? FOREHEAD RECONSTRUCTION Right   ? "removed bone protruding out of my forehead"  ? HARDWARE REMOVAL  2016  ? "related to neck OR"  ? INCONTINENCE SURGERY    ? IR THORACENTESIS ASP PLEURAL SPACE W/IMG GUIDE  02/16/2020  ? MINIMALLY INVASIVE MAZE PROCEDURE N/A 01/17/2020  ? Procedure: MINIMALLY INVASIVE MAZE PROCEDURE;  Surgeon: ORexene Alberts MD;  [place]  Service: Open Heart Surgery;  Laterality: N/A;  ? MITRAL VALVE REPAIR Right 01/17/2020  ? Procedure: MINIMALLY INVASIVE MITRAL VALVE REPAIR (MVR) USING MEMO 4D 32MM;  Surgeon: ORexene Alberts MD;  [place]  Service: Open Heart Surgery;  Laterality: Right;  ? POSTERIOR CERVICAL FUSION/FORAMINOTOMY  ~  2008; 2015  ? RIGHT/LEFT HEART CATH AND CORONARY ANGIOGRAPHY N/A 12/02/2019  ? Procedure: RIGHT/LEFT HEART CATH AND CORONARY ANGIOGRAPHY;  Surgeon: Burnell Blanks, MD;  Location: [place] CV [facility];  Service: Cardiovascular;  Laterality: N/A;  ? SHOULDER ARTHROSCOPY W/ ROTATOR CUFF REPAIR Right 2012  ? SPINAL FUSION  06/2014 - 2018 X ?7  ? "scoliosis; my entire back"  ? TEE WITHOUT CARDIOVERSION N/A 11/21/2019  ? Procedure: TRANSESOPHAGEAL ECHOCARDIOGRAM (TEE);  Surgeon: Josue Hector, MD;  [facility] ENDOSCOPY;  Service: Cardiovascular;  Laterality: N/A;  ? TEE WITHOUT CARDIOVERSION N/A 01/17/2020  ? Procedure:  TRANSESOPHAGEAL ECHOCARDIOGRAM (TEE);  Surgeon: Rexene Alberts, MD;  Location: [place];  Service: Open Heart Surgery;  Laterality: N/A;  ? TEE WITHOUT CARDIOVERSION N/A 01/26/2020  ? Procedure: TRANSESOPHAGEAL ECHOCARDIOGRAM (TEE);  Surgeon: Geralynn Rile, MD;  Location: [place];  Service: Cardiovascular;  Laterality: N/A;  ? TUBAL LIGATION    ? VAGINAL HYSTERECTOMY    ? TVH  ? ? ? ?PHYSICAL EXAM:  ?VS: BP 108/66 (BP Location: Left Arm, Patient Position: Sitting)   Ht '5\' 6"'$  (1.676 m)   Wt 114 lb (51.7 kg)   BMI 18.40 kg/m?  ?Physical Exam ?Gen: NAD, alert, cooperative with exam, well-appearing ?MSK:  ?Neurovascularly intact   ? ?Limited ultrasound: Left knee: ? ?Trace effusion in the suprapatellar pouch. ?Increased thickness over the prepatellar bursa. ?Increased hyperemia over the proximal tibial plateau. ?No changes over the medial or lateral joint space ? ?Summary: Findings most consistent with contusion or nondisplaced tibial plateau fracture. ? ?Ultrasound and interpretation by [name], MD ? ? ? ?ASSESSMENT & PLAN:  ? ?Contusion of left knee ?Acutely occurring.  Initial injury on 5/2.[name] Symptoms seem more consistent with a contusion at this time right now.  Does have hyperemia at the proximal tibial plateau.  She will monitor for occult fracture. ?-Counseled on home exercise therapy and supportive care. ?-Prescription for rollator. ?-Could consider further imaging or physical therapy. ? ? ? ? ?

## 2021-11-07 NOTE — Patient Instructions (Signed)
Nice to meet you ?Please alternate heat and ice  ?Please try the rolling walker   ?Please send me a message in MyChart with any questions or updates.  ?Please see me back in 2 weeks.  ? ?--Dr. Raeford Razor ? ?

## 2021-11-07 NOTE — Assessment & Plan Note (Signed)
Acutely occurring.  Initial injury on 5/2.[name] Symptoms seem more consistent with a contusion at this time right now.  Does have hyperemia at the proximal tibial plateau.  She will monitor for occult fracture. ?-Counseled on home exercise therapy and supportive care. ?-Prescription for rollator. ?-Could consider further imaging or physical therapy. ?

## 2021-11-08 ENCOUNTER — Encounter: Payer: Self-pay | Admitting: Family Medicine

## 2021-11-11 ENCOUNTER — Encounter: Payer: Self-pay | Admitting: Family Medicine

## 2021-11-13 ENCOUNTER — Encounter: Payer: Self-pay | Admitting: Family Medicine

## 2021-11-14 ENCOUNTER — Telehealth: Payer: Self-pay | Admitting: Pharmacist

## 2021-11-14 DIAGNOSIS — Z5181 Encounter for therapeutic drug level monitoring: Secondary | ICD-10-CM | POA: Diagnosis not present

## 2021-11-14 DIAGNOSIS — M810 Age-related osteoporosis without current pathological fracture: Secondary | ICD-10-CM | POA: Diagnosis not present

## 2021-11-14 NOTE — Progress Notes (Signed)
Chronic Care Management Pharmacy Assistant   Name: [name]  MRN: [id number] DOB: 07-Nov-1945   Reason for Encounter: Disease State - General Adherence Call   Recent office visits:  10/31/20 Regina Asa, MD - Family Medicine - Severed protein calorie malnutrition - Labs were ordered. mirtazapine (REMERON) 7.5 MG tablet prescribed. Follow up in 4-6 weeks.   10/10/21 Annual Medicare Wellness completed.   Recent consult visits:  11/07/21 [name] MD - Sports Medicine - Contusion of left knee - [place] limited joint space structures left ordered. Symptomatic care instructions given. Follow up in 2 weeks.  10/17/21 [name] - Orthopedic Surgery - Right shoulder injection administered without complication. Follow up as scheduled.   09/26/21 Regina Kaufman MD - Cardiology - Labs were ordered. Continue lasix 20 mg daily. Follow up as scheduled.   Hospital visits: 11/05/21 Medication Reconciliation was completed by comparing discharge summary, patient's EMR and Pharmacy list, and upon discussion with patient.  Admitted to the hospital on 11/05/21 due to Acute pain of knee. Discharge date was 11/05/21. Discharged from [place]?Medications Started at [facility] Discharge:?? None noted.   Medication Changes at Hospital Discharge: None noted  Medications Discontinued at Hospital Discharge: None noted  Medications that remain the same after Hospital Discharge:??  All other medications will remain the same.     Hospital visits: 09/02/21 Medication Reconciliation was completed by comparing discharge summary, patient's EMR and Pharmacy list, and upon discussion with patient.  Admitted to the hospital on 09/02/21 due to Epistaxis. Discharge date was 09/02/21. Discharged from [place]?Medications Started at [facility] Discharge:?? None noted  Medication Changes at Hospital Discharge: None noted  Medications Discontinued at Hospital Discharge: None  noted  Medications that remain the same after Hospital Discharge:??  All other medications will remain the same.    Medications: Outpatient Encounter Medications as of 11/14/2021  Medication Sig Note   acetaminophen (TYLENOL) 500 MG tablet Take 500 mg by mouth every 6 (six) hours as needed for moderate pain.     amLODipine (NORVASC) 5 MG tablet Take 1 tablet by mouth once daily    apixaban (ELIQUIS) 5 MG TABS tablet Take 1 tablet (5 mg total) by mouth 2 (two) times daily.    b complex vitamins tablet Take 1 tablet by mouth daily.    Calcium Carbonate Antacid (TUMS CHEWY BITES PO) Take 1 tablet by mouth daily as needed (reflux).     Calcium Citrate-Vitamin D (CALCIUM + D PO) Take 1 tablet by mouth daily.    Carboxymethylcellul-Glycerin (LUBRICATING EYE DROPS OP) Place 1 drop into both eyes daily as needed (dry eyes).    clonazePAM (KLONOPIN) 0.5 MG tablet Take 1 tablet by mouth twice daily as needed for anxiety    cyclobenzaprine (FLEXERIL) 10 MG tablet Take 10 mg by mouth at bedtime as needed for muscle spasms.     denosumab (PROLIA) 60 MG/ML SOSY injection Inject 60 mg into the skin every 6 (six) months.    furosemide (LASIX) 20 MG tablet Take 1 tablet (20 mg total) by mouth daily. If you gain weight (>2lbs in 1-2 days or 3-5lbs in 3-5 days), you may take an extra dose of lasix at that time until your weight is back to baseline. 09/27/2021: LASIX INCREASED TO 20 MG PO BID X 3 DAYS THEN DECREASE BACK TO DAILY THEREAFTER PER DR. Johney Frame    levothyroxine (SYNTHROID) 75 MCG tablet Take 1 tablet by mouth once  daily    meclizine (ANTIVERT) 25 MG tablet TAKE 1 TABLET BY MOUTH THREE TIMES DAILY AS NEEDED FOR DIZZINESS    metoprolol tartrate (LOPRESSOR) 50 MG tablet Take 1 tablet (50 mg total) by mouth 2 (two) times daily.    mirtazapine (REMERON) 7.5 MG tablet Take 1 tablet (7.5 mg total) by mouth at bedtime.    Nutritional Supplements (,FEEDING SUPPLEMENT, PROSOURCE PLUS) liquid Take 30 mLs by mouth  2 (two) times daily between meals.    nystatin (MYCOSTATIN/NYSTOP) powder Apply topically 2 (two) times daily as needed (skin irritation (under breasts)).    ondansetron (ZOFRAN) 4 MG tablet Take 1 tablet (4 mg total) by mouth every 8 (eight) hours as needed.    oxyCODONE (ROXICODONE) 5 MG/5ML solution Take 4 mLs (4 mg total) by mouth every 4 (four) hours as needed for moderate pain.    polyethylene glycol (MIRALAX / GLYCOLAX) packet Take 17 g by mouth daily as needed for mild constipation.    promethazine (PHENERGAN) 25 MG tablet Take 1 tablet (25 mg total) by mouth 2 (two) times daily as needed for nausea or vomiting.    sodium chloride (OCEAN) 0.65 % SOLN nasal spray Place 1 spray into both nostrils as needed for congestion (nose bleeds).    triamcinolone ointment (KENALOG) 0.1 % Apply 1 application topically 2 (two) times daily.    valACYclovir (VALTREX) 500 MG tablet Take 500 mg by mouth daily as needed (breakouts).     No facility-administered encounter medications on file as of 11/14/2021.    Contacted [name] for General Review Call   Chart Review:  Have there been any documented new, changed, or discontinued medications since last visit? No (If yes, include name, dose, frequency, date) Has there been any documented recent hospitalizations or ED visits since last visit with Clinical Pharmacist? Yes Brief Summary (including medication and/or Diagnosis changes): 11/05/21 acute knee pain and 09/02/21 Epistaxis   Adherence Review:  Does the Clinical Pharmacist Assistant have access to adherence rates? Yes Adherence rates for STAR metric medications (List medication(s)/day supply/ last 2 fill dates). Adherence rates for medications indicated for disease state being reviewed (List medication(s)/day supply/ last 2 fill dates). Does the patient have >5 day gap between last estimated fill dates for any of the above medications or other medication gaps? No Reason for medication  gaps.   Disease State Questions:  Able to connect with Patient? No Did patient have any problems with their health recently?  Note problems and Concerns: Have you had any admissions or emergency room visits or worsening of your condition(s) since last visit?  Details of ED visit, hospital visit and/or worsening condition(s): Have you had any visits with new specialists or providers since your last visit?  Explain: Have you had any new health care problem(s) since your last visit?  New problem(s) reported: Have you run out of any of your medications since you last spoke with clinical pharmacist?  What caused you to run out of your medications? Are there any medications you are not taking as prescribed?  What kept you from taking your medications as prescribed? Are you having any issues or side effects with your medications?  Note of issues or side effects: Do you have any other health concerns or questions you want to discuss with your Clinical Pharmacist before your next visit?  Note additional concerns and questions from Patient. Are there any health concerns that you feel we can do a better job addressing?  Note Patient's  response. Are you having any problems with any of the following since the last visit: (select all that apply)  None  Details: 12. Any falls since last visit?   Details: 13. Any increased or uncontrolled pain since last visit?   Details: 14. Next visit Type: telephone       Visit with: [name] MD        Date: 11/21/21        Time: 11:10am  15. Additional Details?     Care Gaps   AWV: done 10/10/21 Colonoscopy: done 02/05/09 DM Eye Exam: N/A DM Foot Exam: N/A Microalbumin: N/A HbgAIC: done 04/16/20 (5.6) DEXA: done 07/03/20 Mammogram: done 12/19/20     Star Rating Drugs: No Star Rating Drugs Noted.     Future Appointments  Date Time Provider [place]  11/21/2021 11:10 AM Rosemarie Ax, MD [facility]  11/28/2021 12:40 PM [name], MD [facility]  03/06/2022 11:20 AM Freada Bergeron, MD [facility]  05/02/2022  1:00 PM [name], MD [facility]  10/16/2022  8:45 AM [facility] COACH [facility]   Multiple attempts were made to contact patient. Attempts were unsuccessful. / ls,CMA   Jobe Gibbon, [place] Pharmacist Assistant  [phone]

## 2021-11-15 ENCOUNTER — Telehealth: Payer: Self-pay | Admitting: *Deleted

## 2021-11-15 NOTE — Telephone Encounter (Signed)
[name] called the office stating she went to the Dermatologist recently and was diagnosed with squamous cell carcinoma. Patient has been seen in the past for superficial clipping of fiber wire tape that had been poking through the skin. Patient states the cancer diagnosis is in the same location as the sternal suture. Apologized to patient for recent diagnosis. Patient asked who she should call if issues with this area begin to arise. Advised patient to contact Dermatologist first. If suture begins to protrude again, advised patient to contact our office. Patient verbalized understanding.  ?

## 2021-11-18 ENCOUNTER — Ambulatory Visit (INDEPENDENT_AMBULATORY_CARE_PROVIDER_SITE_OTHER): Payer: Medicare Other | Admitting: Family Medicine

## 2021-11-18 ENCOUNTER — Encounter: Payer: Self-pay | Admitting: Family Medicine

## 2021-11-18 VITALS — BP 124/68 | Ht 66.0 in | Wt 116.0 lb

## 2021-11-18 DIAGNOSIS — S82122G Displaced fracture of lateral condyle of left tibia, subsequent encounter for closed fracture with delayed healing: Secondary | ICD-10-CM | POA: Diagnosis not present

## 2021-11-18 NOTE — Assessment & Plan Note (Signed)
Acutely worsening.  She had a fall onto her knee on 5/2.  X-ray showed no fracture but having significant pain and limitations with her function today.  Concern for occult fracture. ?-Counseled on home exercise therapy and supportive care. ?-MRI of the left knee to evaluate for internal derangement and lateral tibial plateau fracture and for presurgical planning ?

## 2021-11-18 NOTE — Patient Instructions (Signed)
Good to see you ?Please continue to stay off of your feet if having pain ?We will get the MRI at [facility] ?Please send me a message in MyChart with any questions or updates.  ?We will follow-up with a virtual visit once the MRI is resulted..  ? ?--Dr. Raeford Razor ? ?

## 2021-11-18 NOTE — Progress Notes (Signed)
?[name] - 76 y.o. female MRN [id number]  Date of birth: 02/23/1946 ? ?SUBJECTIVE:  Including CC & ROS.  ?No chief complaint on file. ? ? ?[name] is a 76 y.o. female that is presenting with worsening of her left knee and tibial pain.  The pain is severe with weightbearing.  She has limited function of the knee.  This is all stemming from her initial fall on 5/2.  X-ray at that time showed no fracture. ? ? ?Review of Systems ?See HPI  ? ?HISTORY: Past Medical, Surgical, Social, and Family History Reviewed & Updated per EMR.   ?Pertinent Historical Findings include: ? ?Past Medical History:  ?Diagnosis Date  ? Allergies   ? Anxiety   ? Arthritis   ? "maybe in my back" (03/31/2018)  ? Benign paroxysmal positional vertigo 06/08/2013  ? Complication of anesthesia   ? Fracture of multiple ribs 2015  ? "don't know from what; dx'd when I in hospital for 1st back OR" (03/31/2018)  ? GERD (gastroesophageal reflux disease)   ? Hair loss 04/12/2012  ? Herpes   ? History of blood transfusion   ? "twice; related to back OR" (03/31/2018)  ? History of kidney stones   ? Interstitial cystitis 11/06/2011  ? 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 "right"  ? Mitral regurgitation   ? Osteopenia 02/18/2012  ? Osteoporosis   ? PAF (paroxysmal atrial fibrillation) ([place]) 2012  ? Peripheral neuropathy 11/06/2011  ? PMDD (premenstrual dysphoric disorder)   ? PONV (postoperative nausea and vomiting)   ? nausea, vomiting, hives and dizziness   ? 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/2020  ? Complete bilateral atrial lesion set using cryothermy and bipolar radiofrequency ablation with clipping of LA appendage via right mini-thoracotomy approach  ? 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 Complex valvuloplasty including artificial Gore-tex neochord placement x12 with 73m Sorin Memo 4D ring annuloplasty  ? Seasonal allergies   ? Vaginal delivery   ? ONE NSVD  ? 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 ? ?Past Surgical History:  ?Procedure Laterality Date   ? ANTERIOR CERVICAL DECOMP/DISCECTOMY FUSION  ~ 2003  ? BACK SURGERY    ? BREAST SURGERY    ? BREAST BIOPSY--RIGHT BENIGN  ? BUNIONECTOMY Bilateral   ? CARDIOVERSION N/A 01/26/2020  ? Procedure: CARDIOVERSION;  Surgeon: OGeralynn Rile MD;  [place]  Service: Cardiovascular;  Laterality: N/A;  ? COSMETIC SURGERY  2016  ? "back of my neck; related to earlier fusion"  ? CYSTOSCOPY W/ STONE MANIPULATION  "several times"  ? DILATION AND CURETTAGE OF UTERUS    ? FOREHEAD RECONSTRUCTION Right   ? "removed bone protruding out of my forehead"  ? HARDWARE REMOVAL  2016  ? "related to neck OR"  ? INCONTINENCE SURGERY    ? IR THORACENTESIS ASP PLEURAL SPACE W/IMG GUIDE  02/16/2020  ? MINIMALLY INVASIVE MAZE PROCEDURE N/A 01/17/2020  ? Procedure: MINIMALLY INVASIVE MAZE PROCEDURE;  Surgeon: ORexene Alberts MD;  [place] [facility]  Service: Open Heart Surgery;  Laterality: N/A;  ? MITRAL VALVE REPAIR Right 01/17/2020  ? Procedure: MINIMALLY INVASIVE MITRAL VALVE REPAIR (MVR) USING MEMO 4D 32MM;  Surgeon: ORexene Alberts MD;  [place] [facility]  Service: Open Heart Surgery;  Laterality: Right;  ? POSTERIOR CERVICAL FUSION/FORAMINOTOMY  ~ 2008; 2015  ? RIGHT/LEFT HEART CATH AND CORONARY ANGIOGRAPHY N/A 12/02/2019  ? Procedure: RIGHT/LEFT HEART CATH AND CORONARY ANGIOGRAPHY;  Surgeon: MBurnell Blanks MD;  Location: [place] [facility];  Service: Cardiovascular;  Laterality: N/A;  ? SHOULDER ARTHROSCOPY W/ ROTATOR CUFF REPAIR Right 2012  ? SPINAL FUSION  06/2014 - 2018 X ?7  ? "scoliosis; my entire back"  ? TEE WITHOUT CARDIOVERSION N/A 11/21/2019  ? Procedure: TRANSESOPHAGEAL ECHOCARDIOGRAM (TEE);  Surgeon: Josue Hector, MD;  Location: [facility] ENDOSCOPY;  Service: Cardiovascular;  Laterality: N/A;  ? TEE WITHOUT CARDIOVERSION N/A 01/17/2020  ? Procedure: TRANSESOPHAGEAL ECHOCARDIOGRAM (TEE);  Surgeon: Rexene Alberts, MD;  Location: [place];  Service: Open Heart Surgery;  Laterality: N/A;  ? TEE WITHOUT  CARDIOVERSION N/A 01/26/2020  ? Procedure: TRANSESOPHAGEAL ECHOCARDIOGRAM (TEE);  Surgeon: Geralynn Rile, MD;  Location: [place];  Service: Cardiovascular;  Laterality: N/A;  ? TUBAL LIGATION    ? VAGINAL HYSTERECTOMY    ? TVH  ? ? ? ?PHYSICAL EXAM:  ?VS: BP 124/68   Ht '5\' 6"'$  (1.676 m)   Wt 116 lb (52.6 kg)   BMI 18.72 kg/m?  ?Physical Exam ?Gen: NAD, alert, cooperative with exam, well-appearing ?MSK:  ?Left knee: ?Tenderness to palpation over the lateral tibial plateau. ?Limited range of motion. ?Pain with weightbearing ?Neurovascularly intact   ? ? ? ? ?ASSESSMENT & PLAN:  ? ?Closed fracture of lateral portion of left tibial plateau ?Acutely worsening.  She had a fall onto her knee on 5/2.  X-ray showed no fracture but having significant pain and limitations with her function today.  Concern for occult fracture. ?-Counseled on home exercise therapy and supportive care. ?-MRI of the left knee to evaluate for internal derangement and lateral tibial plateau fracture and for presurgical planning ? ? ? ? ?

## 2021-11-19 ENCOUNTER — Ambulatory Visit (INDEPENDENT_AMBULATORY_CARE_PROVIDER_SITE_OTHER): Payer: Medicare Other

## 2021-11-19 DIAGNOSIS — S82092A Other fracture of left patella, initial encounter for closed fracture: Secondary | ICD-10-CM | POA: Diagnosis not present

## 2021-11-19 DIAGNOSIS — S82122G Displaced fracture of lateral condyle of left tibia, subsequent encounter for closed fracture with delayed healing: Secondary | ICD-10-CM

## 2021-11-21 ENCOUNTER — Telehealth (INDEPENDENT_AMBULATORY_CARE_PROVIDER_SITE_OTHER): Payer: Medicare Other | Admitting: Family Medicine

## 2021-11-21 ENCOUNTER — Ambulatory Visit: Payer: Medicare Other | Admitting: Family Medicine

## 2021-11-21 DIAGNOSIS — S82092D Other fracture of left patella, subsequent encounter for closed fracture with routine healing: Secondary | ICD-10-CM

## 2021-11-21 DIAGNOSIS — S82122D Displaced fracture of lateral condyle of left tibia, subsequent encounter for closed fracture with routine healing: Secondary | ICD-10-CM

## 2021-11-21 DIAGNOSIS — S82002D Unspecified fracture of left patella, subsequent encounter for closed fracture with routine healing: Secondary | ICD-10-CM | POA: Insufficient documentation

## 2021-11-21 NOTE — Progress Notes (Signed)
Virtual Visit via Video Note  I connected with [name] on 11/21/21 at  9:50 AM EDT by a video enabled telemedicine application and verified that I am speaking with the correct person using two identifiers.  Location: Patient: home Provider: office   I discussed the limitations of evaluation and management by telemedicine and the availability of in person appointments. The patient expressed understanding and agreed to proceed.  History of Present Illness:  Ms. Kochel is a 76 year old female that is following up after MRI of her left knee.  The MRI was demonstrating acute nondisplaced fracture of the inferior pole of the patella as well as bone marrow edema in the anterior aspect of the central tibial plateau.  Observations/Objective:   Assessment and Plan:  Fracture of the left patella at the inferior pole: MRI was demonstrating a nondisplaced fracture at the inferior pole.  She has gotten improvement over the past few days and her pain. -Counseled on home exercise therapy and supportive care. -Could consider physical therapy.  Fracture of the tibial plateau of the left knee: MRI is showing a fracture of the central portion of the tibial plateau.  She has noticed her pain improving. -Counseled on home exercise therapy and supportive care. -Advised to limit her weightbearing as the pain continues to improve. -Could consider physical therapy.  Follow Up Instructions:    I discussed the assessment and treatment plan with the patient. The patient was provided an opportunity to ask questions and all were answered. The patient agreed with the plan and demonstrated an understanding of the instructions.   The patient was advised to call back or seek an in-person evaluation if the symptoms worsen or if the condition fails to improve as anticipated.    [name], MD

## 2021-11-21 NOTE — Assessment & Plan Note (Signed)
MRI was demonstrating a nondisplaced fracture at the inferior pole.  She has gotten improvement over the past few days and her pain. -Counseled on home exercise therapy and supportive care. -Could consider physical therapy.

## 2021-11-21 NOTE — Assessment & Plan Note (Signed)
MRI is showing a fracture of the central portion of the tibial plateau.  She has noticed her pain improving. -Counseled on home exercise therapy and supportive care. -Advised to limit her weightbearing as the pain continues to improve. -Could consider physical therapy.

## 2021-11-25 DIAGNOSIS — M4693 Unspecified inflammatory spondylopathy, cervicothoracic region: Secondary | ICD-10-CM | POA: Diagnosis not present

## 2021-11-25 DIAGNOSIS — M961 Postlaminectomy syndrome, not elsewhere classified: Secondary | ICD-10-CM | POA: Diagnosis not present

## 2021-11-25 DIAGNOSIS — G894 Chronic pain syndrome: Secondary | ICD-10-CM | POA: Diagnosis not present

## 2021-11-25 DIAGNOSIS — Z79891 Long term (current) use of opiate analgesic: Secondary | ICD-10-CM | POA: Diagnosis not present

## 2021-11-25 DIAGNOSIS — M5412 Radiculopathy, cervical region: Secondary | ICD-10-CM | POA: Diagnosis not present

## 2021-11-28 ENCOUNTER — Ambulatory Visit (INDEPENDENT_AMBULATORY_CARE_PROVIDER_SITE_OTHER): Payer: Medicare Other | Admitting: Family Medicine

## 2021-11-28 ENCOUNTER — Encounter: Payer: Self-pay | Admitting: Family Medicine

## 2021-11-28 VITALS — BP 120/64 | HR 70 | Temp 98.8°F | Resp 16 | Ht 66.0 in | Wt 117.6 lb

## 2021-11-28 DIAGNOSIS — F419 Anxiety disorder, unspecified: Secondary | ICD-10-CM

## 2021-11-28 DIAGNOSIS — E43 Unspecified severe protein-calorie malnutrition: Secondary | ICD-10-CM | POA: Diagnosis not present

## 2021-11-28 DIAGNOSIS — F32A Depression, unspecified: Secondary | ICD-10-CM | POA: Diagnosis not present

## 2021-11-28 NOTE — Assessment & Plan Note (Signed)
Ongoing issue.  She has gained 3 lbs since last visit.  Applauded her efforts at regular eating and addition of protein rich foods.  Will follow.

## 2021-11-28 NOTE — Patient Instructions (Addendum)
Follow up in 3 months to recheck mood Continue the Mirtazapine 1/2 tab nightly x2 more weeks Then increase to 1 tab nightly You definitely seem less anxious and more at ease- this is great!! Call with any questions or concerns Have a great summer!!

## 2021-11-28 NOTE — Assessment & Plan Note (Signed)
Improving since starting Mirtazapine.  This is first visit in years that pt hasn't become overwhelmed and tearful when discussing her situation.  Applauded her decision to stick with the medication.  Will continue a 1/2 tab x2 more weeks and then increase to 1 tab nightly.  Pt expressed understanding and is in agreement w/ plan.

## 2021-11-28 NOTE — Progress Notes (Signed)
   Subjective:    Patient ID: [name], female    DOB: 12-16-1945, 76 y.o.   MRN: [id number]  HPI Anxiety/depression- pt started Remeron at last visit.  She found that the 7.'5mg'$  dose was too sedating.  She is taking 1/2 tab and isn't nearly as sleepy.  She does report 'weird dreams'.  Severe protein calorie malnutrition- pt has gained 3 lbs since last visit.  'i'm eating like a little pig'.   Review of Systems For ROS see HPI     Objective:   Physical Exam Vitals reviewed.  Constitutional:      General: She is not in acute distress.    Appearance: She is not ill-appearing.     Comments: Very thin  HENT:     Head: Normocephalic and atraumatic.  Skin:    General: Skin is warm and dry.  Neurological:     General: No focal deficit present.     Mental Status: She is alert and oriented to person, place, and time.  Psychiatric:        Mood and Affect: Mood normal.        Behavior: Behavior normal.        Thought Content: Thought content normal.          Assessment & Plan:

## 2021-12-30 DIAGNOSIS — R922 Inconclusive mammogram: Secondary | ICD-10-CM | POA: Diagnosis not present

## 2021-12-30 LAB — HM MAMMOGRAPHY

## 2021-12-31 ENCOUNTER — Encounter: Payer: Self-pay | Admitting: Family Medicine

## 2021-12-31 ENCOUNTER — Encounter: Payer: Self-pay | Admitting: Obstetrics & Gynecology

## 2022-01-01 DIAGNOSIS — Z85820 Personal history of malignant melanoma of skin: Secondary | ICD-10-CM | POA: Diagnosis not present

## 2022-01-01 DIAGNOSIS — L304 Erythema intertrigo: Secondary | ICD-10-CM | POA: Diagnosis not present

## 2022-01-01 DIAGNOSIS — L231 Allergic contact dermatitis due to adhesives: Secondary | ICD-10-CM | POA: Diagnosis not present

## 2022-01-16 DIAGNOSIS — Z85820 Personal history of malignant melanoma of skin: Secondary | ICD-10-CM | POA: Diagnosis not present

## 2022-01-16 DIAGNOSIS — B078 Other viral warts: Secondary | ICD-10-CM | POA: Diagnosis not present

## 2022-01-16 DIAGNOSIS — L570 Actinic keratosis: Secondary | ICD-10-CM | POA: Diagnosis not present

## 2022-01-16 DIAGNOSIS — Z85828 Personal history of other malignant neoplasm of skin: Secondary | ICD-10-CM | POA: Diagnosis not present

## 2022-01-20 ENCOUNTER — Encounter: Payer: Self-pay | Admitting: Family Medicine

## 2022-01-20 ENCOUNTER — Other Ambulatory Visit: Payer: Self-pay

## 2022-01-20 DIAGNOSIS — M818 Other osteoporosis without current pathological fracture: Secondary | ICD-10-CM

## 2022-01-23 DIAGNOSIS — H43813 Vitreous degeneration, bilateral: Secondary | ICD-10-CM | POA: Diagnosis not present

## 2022-01-23 DIAGNOSIS — H35372 Puckering of macula, left eye: Secondary | ICD-10-CM | POA: Diagnosis not present

## 2022-01-23 DIAGNOSIS — H25813 Combined forms of age-related cataract, bilateral: Secondary | ICD-10-CM | POA: Diagnosis not present

## 2022-01-25 ENCOUNTER — Telehealth: Payer: Self-pay | Admitting: Physician Assistant

## 2022-01-25 DIAGNOSIS — K219 Gastro-esophageal reflux disease without esophagitis: Secondary | ICD-10-CM | POA: Diagnosis not present

## 2022-01-25 DIAGNOSIS — Z91041 Radiographic dye allergy status: Secondary | ICD-10-CM | POA: Diagnosis not present

## 2022-01-25 DIAGNOSIS — Z8679 Personal history of other diseases of the circulatory system: Secondary | ICD-10-CM | POA: Diagnosis not present

## 2022-01-25 DIAGNOSIS — Z882 Allergy status to sulfonamides status: Secondary | ICD-10-CM | POA: Diagnosis not present

## 2022-01-25 DIAGNOSIS — R0789 Other chest pain: Secondary | ICD-10-CM | POA: Diagnosis not present

## 2022-01-25 DIAGNOSIS — Z87891 Personal history of nicotine dependence: Secondary | ICD-10-CM | POA: Diagnosis not present

## 2022-01-25 DIAGNOSIS — Z79899 Other long term (current) drug therapy: Secondary | ICD-10-CM | POA: Diagnosis not present

## 2022-01-25 DIAGNOSIS — R0609 Other forms of dyspnea: Secondary | ICD-10-CM | POA: Diagnosis not present

## 2022-01-25 DIAGNOSIS — Z794 Long term (current) use of insulin: Secondary | ICD-10-CM | POA: Diagnosis not present

## 2022-01-25 DIAGNOSIS — J984 Other disorders of lung: Secondary | ICD-10-CM | POA: Diagnosis not present

## 2022-01-25 DIAGNOSIS — R079 Chest pain, unspecified: Secondary | ICD-10-CM | POA: Diagnosis not present

## 2022-01-25 DIAGNOSIS — Z91048 Other nonmedicinal substance allergy status: Secondary | ICD-10-CM | POA: Diagnosis not present

## 2022-01-25 DIAGNOSIS — Z888 Allergy status to other drugs, medicaments and biological substances status: Secondary | ICD-10-CM | POA: Diagnosis not present

## 2022-01-25 DIAGNOSIS — Z91040 Latex allergy status: Secondary | ICD-10-CM | POA: Diagnosis not present

## 2022-01-25 DIAGNOSIS — R0602 Shortness of breath: Secondary | ICD-10-CM | POA: Diagnosis not present

## 2022-01-25 NOTE — Telephone Encounter (Signed)
76 year old female with past medical history of MR status post MV repair complicated by LV free wall laceration requiring median sternotomy, postop atrial fibrillation, scoliosis.  Patient was last seen by Dr. [name] on 09/26/2021.  In the past week, she had complained of increased substernal chest discomfort with worsening dyspnea and fatigue.  She tried to double up on her Lasix for the past 4 days however symptom shows no improvement.  She continues to have lower extremity edema as well.  I advised her to seek more urgent medical attention at the local emergency room.  She does not have any family to take her to the local ED, she will call 911 to have ambulance to take her.

## 2022-01-26 ENCOUNTER — Other Ambulatory Visit: Payer: Self-pay | Admitting: Family Medicine

## 2022-01-26 DIAGNOSIS — E785 Hyperlipidemia, unspecified: Secondary | ICD-10-CM

## 2022-01-27 ENCOUNTER — Telehealth: Payer: Self-pay | Admitting: Cardiology

## 2022-01-27 NOTE — Progress Notes (Unsigned)
Cardiology Office Note:    Date:  01/29/2022   ID:  Regina, [name] 1945-07-08, MRN [id number]  PCP:  [name], MD   [facility] Providers Cardiologist:  Freada Bergeron, MD Electrophysiologist:  Constance Haw, MD  { Referring MD: [name], MD   History of Present Illness:    [name] is a 76 y.o. female with a hx of severe MR s/p MV repair (32 mm Sorin Memo 4D ring annuloplasty valve) complicated by LV free wall laceration requiring median sternotomy, pAfib, scoliosis requiring multiple spine surgeries and anxiety who presents to clinic for follow-up.   Per review of the record, patient has been followed for MVP/MR. TEE on 11/21/19 demonstrated presence of mitral valve prolapse with a large flail segment felt to be the A3 portion of the anterior leaflet with severe mitral regurgitation. She was seen by Dr. Ricard Dillon and underwent Minimally Invasive Mitral Valve Repair, Complete MAZE procedure, and Sternotomy with repair of Left Ventricular Free- wall laceration on 01/17/20.  She was extubated the evening of surgery. She was started on coumadin for her Mitral Valve Repair.  Her post-op course was complicated by acute blood loss anemia requiring blood transfusions and Afib with RVR for which she was placed on Amiodarone and lopressor and her tikosyn was discontinued. She follows with [name] in clinic and has since been transitioned to [place].   Seen in clinic 08/23/20 where she was having right sided chest pain/burning where her incision site was located. She was very concerned that it may be related to her heart or breast cancer. Given localization around scar tissue site, nonexertional nature, and no masses, suspected it was likely related to neuropathic pain and scar tissue. CTA, chest xray, d-dimer all reassuringly normal. Given her significant allergies to medications, we recommended she follow-up with her chronic pain physician.  Saw Dr. Roxy Manns on  01/15/21 where she was deemed to be stable from a CV standpoint.  Was seen in clinic on 01/23/21 where she was doing to complain of intermittent shortness of breath, chronic pain and depression. No changes in meds at that time.  Seen in clinic 04/2021 by Dr. Marlou Porch for Afib. At that visit, she was back in NSR and she was continued on metop and xarelto.   Was last seen in clinic on 09/2021 where he was complaining of mild exertional SOB and knife like cutting pain in her upper back. We obtained a BNP which was elevated to 1050. We increased her lasix to '20mg'$  BIDx 3days and then daily thereafter.  She called the after hours line on 01/25/22 for chest pain and SOB. She was instructed to go to the ER. Her trop was negative. NT-BNP 1182.   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[place]) ~ 2003   "right"   Mitral regurgitation    Osteopenia 02/18/2012  Osteoporosis    PAF (paroxysmal atrial fibrillation) ([place]) 2012   Peripheral neuropathy 11/06/2011   PMDD (premenstrual dysphoric disorder)    PONV (postoperative nausea and vomiting)    nausea, vomiting, hives and dizziness    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/2020   Complete bilateral atrial lesion set using cryothermy and bipolar radiofrequency ablation with clipping of LA appendage via right mini-thoracotomy approach   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   Complex valvuloplasty including artificial Gore-tex neochord  placement x12 with 63m Sorin Memo 4D ring annuloplasty   Seasonal allergies    Vaginal delivery    ONE NSVD   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    Past Surgical History:  Procedure Laterality Date   ANTERIOR CERVICAL DECOMP/DISCECTOMY FUSION  ~ 2003   BACK SURGERY     BREAST SURGERY     BREAST BIOPSY--RIGHT BENIGN   BUNIONECTOMY Bilateral    CARDIOVERSION N/A 01/26/2020   Procedure: CARDIOVERSION;  Surgeon: OGeralynn Rile MD;  [facility]  Service: Cardiovascular;  Laterality: N/A;   COSMETIC SURGERY  2016   "back of my neck; related to earlier fusion"   [place]    "removed bone protruding out of my forehead"   HARDWARE REMOVAL  2016   "related to neck OR"   INCONTINENCE SURGERY     IR THORACENTESIS ASP PLEURAL SPACE W/IMG GUIDE  02/16/2020   MINIMALLY INVASIVE MAZE PROCEDURE N/A 01/17/2020   Procedure: MINIMALLY INVASIVE MAZE PROCEDURE;  Surgeon: ORexene Alberts MD;  [place] [facility]  Service: Open Heart Surgery;  Laterality: N/A;   MITRAL VALVE REPAIR Right 01/17/2020   Procedure: MINIMALLY INVASIVE MITRAL VALVE REPAIR (MVR) USING MEMO 4D 32MM;  Surgeon: ORexene Alberts MD;  [place]  Service: Open Heart Surgery;  Laterality: Right;   POSTERIOR CERVICAL FUSION/FORAMINOTOMY  ~ 2008; 2015   RIGHT/LEFT HEART CATH AND CORONARY ANGIOGRAPHY N/A 12/02/2019   Procedure: RIGHT/LEFT HEART CATH AND CORONARY ANGIOGRAPHY;  Surgeon: MBurnell Blanks MD;  [facility];  Service: Cardiovascular;  Laterality: N/A;   SHOULDER ARTHROSCOPY W/ ROTATOR CUFF REPAIR Right 2012   SPINAL FUSION  06/2014 - 2018 X ?7   "scoliosis; my entire back"   TEE WITHOUT CARDIOVERSION N/A 11/21/2019   Procedure: TRANSESOPHAGEAL ECHOCARDIOGRAM (TEE);  Surgeon: NJosue Hector MD;  [facility]  Service: Cardiovascular;  Laterality: N/A;   TEE WITHOUT  CARDIOVERSION N/A 01/17/2020   Procedure: TRANSESOPHAGEAL ECHOCARDIOGRAM (TEE);  Surgeon: ORexene Alberts MD;  [facility]  Service: Open Heart Surgery;  Laterality: N/A;   TEE WITHOUT CARDIOVERSION N/A 01/26/2020   Procedure: TRANSESOPHAGEAL ECHOCARDIOGRAM (TEE);  Surgeon: OGeralynn Rile MD;  [place] [facility]  Service: Cardiovascular;  Laterality: N/A;   TUBAL LIGATION     VAGINAL HYSTERECTOMY     TVH    Current Medications: Current Meds  Medication Sig   acetaminophen (TYLENOL) 500 MG tablet Take 500 mg by mouth every 6 (six) hours as needed for moderate pain.    amLODipine (NORVASC) 5 MG tablet Take 1 tablet by mouth once daily   apixaban (ELIQUIS) 5 MG TABS tablet Take 1 tablet (5 mg total) by mouth 2 (two) times daily.   b complex vitamins tablet Take 1 tablet by mouth daily.   Calcium Carbonate Antacid (TUMS CHEWY BITES PO) Take 1 tablet by mouth daily as needed (reflux).  Calcium Citrate-Vitamin D (CALCIUM + D PO) Take 1 tablet by mouth daily.   Carboxymethylcellul-Glycerin (LUBRICATING EYE DROPS OP) Place 1 drop into both eyes daily as needed (dry eyes).   clonazePAM (KLONOPIN) 0.5 MG tablet Take 1 tablet by mouth twice daily as needed for anxiety   cyclobenzaprine (FLEXERIL) 10 MG tablet Take 10 mg by mouth at bedtime as needed for muscle spasms.    denosumab (PROLIA) 60 MG/ML SOSY injection Inject 60 mg into the skin every 6 (six) months.   furosemide (LASIX) 20 MG tablet Take 1 tablet (20 mg total) by mouth daily. If you gain weight (>2lbs in 1-2 days or 3-5lbs in 3-5 days), you may take an extra dose of lasix at that time until your weight is back to baseline.   levothyroxine (SYNTHROID) 75 MCG tablet Take 1 tablet by mouth once daily   meclizine (ANTIVERT) 25 MG tablet TAKE 1 TABLET BY MOUTH THREE TIMES DAILY AS NEEDED FOR DIZZINESS   metoprolol tartrate (LOPRESSOR) 50 MG tablet Take 1 tablet (50 mg total) by mouth 2 (two) times daily.   Nutritional  Supplements (,FEEDING SUPPLEMENT, PROSOURCE PLUS) liquid Take 30 mLs by mouth 2 (two) times daily between meals.   nystatin (MYCOSTATIN/NYSTOP) powder Apply topically 2 (two) times daily as needed (skin irritation (under breasts)).   ondansetron (ZOFRAN) 4 MG tablet Take 1 tablet (4 mg total) by mouth every 8 (eight) hours as needed.   oxyCODONE (ROXICODONE) 5 MG/5ML solution Take 4 mLs (4 mg total) by mouth every 4 (four) hours as needed for moderate pain.   polyethylene glycol (MIRALAX / GLYCOLAX) packet Take 17 g by mouth daily as needed for mild constipation.   promethazine (PHENERGAN) 25 MG tablet Take 1 tablet (25 mg total) by mouth 2 (two) times daily as needed for nausea or vomiting.   sodium chloride (OCEAN) 0.65 % SOLN nasal spray Place 1 spray into both nostrils as needed for congestion (nose bleeds).   valACYclovir (VALTREX) 500 MG tablet Take 500 mg by mouth daily as needed (breakouts).      Allergies:   Buprenorphine, Erythromycin, Iodinated contrast media, Latex, Other, Tape, Ciprofloxacin, Duloxetine hcl, Erythromycin base, Hydrocodone, Hydromorphone, Iodine, Levofloxacin, Metrizamide, Nsaids, Nucynta [tapentadol hcl], Oxycodone, Pentazocine, Septra [bactrim], Sulfasalazine, Morphine, Sulfa antibiotics, Sulfamethoxazole-trimethoprim, and Tapentadol   Social History   Socioeconomic History   Marital status: Divorced    Spouse name: none/divorced   Number of children: Not on file   Years of education: Not on file   Highest education level: 12th grade  Occupational History   Occupation: retired  Tobacco Use   Smoking status: Former    Packs/day: 0.10    Years: 14.00    Total pack years: 1.40    Types: Cigarettes    Quit date: 1980    Years since quitting: 43.5   Smokeless tobacco: Never   Tobacco comments:    03/31/2018 "quit ~ 1980; someday smoker when I did smoke; never addicted"  Vaping Use   Vaping Use: Never used  Substance and Sexual Activity   Alcohol use: Never    Drug use: Yes    Types: Oxycodone, Benzodiazepines    Comment: 03/31/2018 "for chronic neck and back pain", takes Klonopin at times.    Sexual activity: Not Currently  Other Topics Concern   Not on file  Social History Narrative   Lives by herself.    Social Determinants of Health   Financial Resource Strain: Low Risk  (10/10/2021)   Overall Financial  Resource Strain (CARDIA)    Difficulty of Paying Living Expenses: Not hard at all  Food Insecurity: No Food Insecurity (10/10/2021)   Hunger Vital Sign    Worried About Running Out of Food in the Last Year: Never true    Ran Out of Food in the Last Year: Never true  Transportation Needs: No Transportation Needs (10/10/2021)   PRAPARE - Hydrologist (Medical): No    Lack of Transportation (Non-Medical): No  Physical Activity: Inactive (10/10/2021)   Exercise Vital Sign    Days of Exercise per Week: 0 days    Minutes of Exercise per Session: 0 min  Stress: No Stress Concern Present (10/10/2021)   [place]    Feeling of Stress : Not at all  Social Connections: Socially Isolated (10/10/2021)   Social Connection and Isolation Panel [NHANES]    Frequency of Communication with Friends and Family: Twice a week    Frequency of Social Gatherings with Friends and Family: Twice a week    Attends Religious Services: Never    Marine scientist or Organizations: No    Attends Music therapist: Never    Marital Status: Divorced     Family History: The patient's family history includes Arthritis in her mother; Diabetes in her brother, brother, and father; Heart Problems in her brother; Heart disease in her mother, sister, and sister; Hyperlipidemia in her sister and sister; Stroke in her sister; Uterine cancer in her mother.  ROS:   Please see the history of present illness.    Review of Systems  Constitutional:  Negative for chills, fever  and malaise/fatigue.  HENT:  Negative for sore throat.   Eyes:  Negative for blurred vision.  Respiratory:  Positive for shortness of breath (exertional).   Cardiovascular:  Positive for chest pain (central chest), palpitations and leg swelling (bilateral). Negative for orthopnea, claudication and PND.  Gastrointestinal:  Negative for abdominal pain, nausea and vomiting.  Genitourinary:  Negative for hematuria.  Musculoskeletal:  Positive for back pain, myalgias and neck pain.  Neurological:  Positive for tremors. Negative for dizziness, loss of consciousness and weakness.  Psychiatric/Behavioral:  Negative for depression.      EKGs/Labs/Other Studies Reviewed:    The following studies were reviewed today: TEE 2020-02-22: IMPRESSIONS   1. Left ventricular ejection fraction, by estimation, is 55 to 60%. The  left ventricle has normal function. The left ventricle has no regional  wall motion abnormalities.   2. Right ventricular systolic function is normal. The right ventricular  size is normal.   3. S/p surgical LAA clipping. No residual connection noted with the LA.  No LA thrombus. No left atrial/left atrial appendage thrombus was  detected.   4. 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 No residual MR. MVA by  direct 3D MPR assessment 2.6 cm2. The mitral valve has been  repaired/replaced. No evidence of mitral valve regurgitation. No evidence  of mitral stenosis. The mean mitral valve  gradient is 4.0 mmHg with average heart rate of 111 bpm. There is a 32 mm  prosthetic annuloplasty ring present in the mitral position. Procedure  Date: 01/17/2020.   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   6. The aortic valve is tricuspid. Aortic valve regurgitation is not  visualized. No aortic stenosis is present.   TTE 02/29/20: IMPRESSIONS   1. There has been no change since the last study on 02/22/20. Normal  LVEF 60-65% and normal transmitral gradients, mean 4 mmHg.   2. Left  ventricular ejection fraction, by estimation, is 60 to 65%. The  left ventricle has normal function. The left ventricle has no regional  wall motion abnormalities. Left ventricular diastolic function could not  be evaluated.   3. Right ventricular systolic function is normal. The right ventricular  size is normal. There is mildly elevated pulmonary artery systolic  pressure. The estimated right ventricular systolic pressure is 45.8 mmHg.   4. The mitral valve has been repaired/replaced. Trivial mitral valve  regurgitation. No evidence of mitral stenosis. The mean mitral valve  gradient is 3.0 mmHg. There is a 32 mm Sorin Memo 4D ring annuloplasty  present in the mitral position. Procedure  Date: 01/17/20.   5. Tricuspid valve regurgitation is mild to moderate.   6. The aortic valve is normal in structure. Aortic valve regurgitation is  trivial. No aortic stenosis is present.   7. The inferior vena cava is normal in size with greater than 50%  respiratory variability, suggesting right atrial pressure of 3 mmHg.   RHC/LHC 12/02/19: Prox RCA lesion is 20% stenosed. Dist LAD lesion is 20% stenosed.   1. Mild non-obstructive CAD   Recommendation: Continue planning for mitral valve repair.     Most Recent Value  Fick Cardiac Output 5.84 L/min  Fick Cardiac Output Index 3.58 (L/min)/BSA  RA A Wave 2 mmHg  RA V Wave 1 mmHg  RA Mean 0 mmHg  RV Systolic Pressure 29 mmHg  RV Diastolic Pressure -2 mmHg  RV EDP 3 mmHg  PA Systolic Pressure 32 mmHg  PA Diastolic Pressure 10 mmHg  PA Mean 21 mmHg  PW A Wave 22 mmHg  PW V Wave 20 mmHg  PW Mean 13 mmHg  AO Systolic Pressure 099 mmHg  AO Diastolic Pressure 61 mmHg  AO Mean 88 mmHg  LV Systolic Pressure 833 mmHg  LV Diastolic Pressure 1 mmHg  LV EDP 9 mmHg  AOp Systolic Pressure 825 mmHg  AOp Diastolic Pressure 66 mmHg  AOp Mean Pressure 93 mmHg  LVp Systolic Pressure 053 mmHg  LVp Diastolic Pressure 0 mmHg  LVp EDP Pressure 7 mmHg         EKG:  EKG was not ordered today  Recent Labs: 09/26/2021: NT-Pro BNP 1,050 10/31/2021: ALT 22; BUN 22; Creatinine, Ser 0.76; Hemoglobin 13.4; Platelets 187.0; Potassium 3.9; Sodium 144; TSH 0.36  Recent Lipid Panel    Component Value Date/Time   CHOL 175 12/10/2020 1338   TRIG 79.0 12/10/2020 1338   HDL 80.40 12/10/2020 1338   CHOLHDL 2 12/10/2020 1338   VLDL 15.8 12/10/2020 1338   LDLCALC 79 12/10/2020 1338   LDLDIRECT 136.5 06/08/2013 1049     Risk Assessment/Calculations:    CHA2DS2-VASc Score = 6  This indicates a 9.7% annual risk of stroke. The patient's score is based upon: CHF History: 1 HTN History: 1 Diabetes History: 0 Stroke History: 0 Vascular Disease History: 1 Age Score: 2 Gender Score: 1         Physical Exam:    VS:  BP 110/60   Pulse 63   Ht '5\' 6"'$  (1.676 m)   Wt 117 lb 12.8 oz (53.4 kg)   SpO2 97%   BMI 19.01 kg/m     Wt Readings from Last 3 Encounters:  01/29/22 117 lb 12.8 oz (53.4 kg)  11/28/21 117 lb 9.6 oz (53.3 kg)  11/18/21 116 lb (52.6 kg)  GEN: Elderly frail female, comfortable, NAD HEENT: Normal NECK: No JVD; No carotid bruits CARDIAC: RRR, 2/6 systolic murmur. No rubs, gallops RESPIRATORY:  CTAB ABDOMEN: Soft, non-tender, non-distended MUSCULOSKELETAL:  Chronic venous stasis changes, no significant edema SKIN: Warm and dry NEUROLOGIC:  Alert and oriented x 3 PSYCHIATRIC:  Depressed  ASSESSMENT:    No diagnosis found.   PLAN:    In order of problems listed above:  #Severe MR s/p Mitral Valve Repair: Stable on TTE 02/2020 with LVEF 60-65%, normal RV size and function, RVSP 6mHg, mean gradient across mitral 341mg, trivial MR. Continues to have some shortness of breath with exertion, but overall stable. Likely related to deconditioning with multiple surgeries for her back and recent MVR. No significant CAD on cath.  -S/p MV repair 01/2020; trivial MR with mean gradient 23m59m in 10/2020 which is  stable -Continue lasix '20mg'$  PO daily -Continue IE prophylaxis for dental procedures   #Chronic Diastolic HF: #DOE: Stable. TTE 10/22/20 with LVEF 50-55%, mean MV gradient 23mm23m trivial MR.  -Continue lasix '20mg'$  daily -Low Na diet  #Paroxysmal Afib: CHADs-vasc 3. Managed by Dr. CamnCurt Bearsff amiodarone  -Did not tolerate metop succinate; continue metop tartrate '50mg'$  BID -Continue apixaban '5mg'$  BID   #Nonobstructive CAD: Pre-operative cath 11/2019 with mild disease (20%) in RCA and LAD. -Patient declined statin therapy as she has significant medication reactions and wants to avoiding trying something new   #Scoliosis: #LE weakness #Neuropathy S/p multiple correctional back surgeries. -Follow-up with primary provider as scheduled -Pain management following   #Depression: -Continue counseling -Follow-up with PCP as scheduled  #Chronic venous stasis: -Continue compression socks  Suspect a lot of her symptoms are related to chronic pain and anxiety. Discussed that she needs to continue to follow-up with her primary provider, pain management and orthopedics.    Medication Adjustments/Labs and Tests Ordered: Current medicines are reviewed at length with the patient today.  Concerns regarding medicines are outlined above.  No orders of the defined types were placed in this encounter.   No orders of the defined types were placed in this encounter.   There are no Patient Instructions on file for this visit.   I,Mykaella Javier,acting as a scribe for HeatFreada Bergeron.,have documented all relevant documentation on the behalf of HeatFreada Bergeron,as directed by  HeatFreada Bergeron while in the presence of HeatFreada Bergeron.  I, HeatFreada Bergeron, have reviewed all documentation for this visit. The documentation on 01/29/22 for the exam, diagnosis, procedures, and orders are all accurate and complete.   Signed, HeatFreada Bergeron  01/29/2022  8:24 AM    [facility]

## 2022-01-27 NOTE — Telephone Encounter (Signed)
I spoke with the pt and she reports that over the past few weeks she has been having increasing chest discomfort, sharp in nature, that is worse with exertion... she has some SOB and dizziness associated with the discomfort but she says this is not new for her.   This past weekend she called the APP on call Friday night with her symptoms.. she says she felt like she was having panic: because our office was closed and she could not talk to anyone and Almyra Deforest PA had suggested that she goes to the ED for assessment.   She then went ot the ED at [facility] in [place] Saturday.They had advised her that she was not having nay acute issues and to call her Cardiologist on Monday to make an appt.... the PCC;s gave her an appt foir this Wed with Dr Johney Frame since she declined an APP.   She says she has felt better since the ED visit... she has been resting... but since she has had bilateral lower extremity edema she has consistently been taking 2 of her Lasix (40 mg total)... she will try to elevate her legs when sitting and decrease her NA intake which she says she has been doing.   Pt is asking to have her appt moved to a later time... she has to bring herself and worries about driving during a busy time to be here at 8 am.... Dr Stanford Breed is in [place] on Wed but he is full... I will forward to Dr Jacolyn Reedy nurse to see if she can move her to a later spot if any cancellations.   If the pt develops any worsening symptoms she will consider the  ED again if needed.

## 2022-01-27 NOTE — Telephone Encounter (Signed)
Pt calling for an update on on her symptoms of chest discomfort with worsening dyspnea and fatigue. Pt did not want to wait till 03/06/22 to see Dr. Johney Frame, she made an appt 01/29/22 at 8:00am

## 2022-01-28 NOTE — Telephone Encounter (Signed)
Pt will see Dr. Johney Frame tomorrow 7/26 for these complaints.

## 2022-01-29 ENCOUNTER — Encounter: Payer: Self-pay | Admitting: Cardiology

## 2022-01-29 ENCOUNTER — Ambulatory Visit (INDEPENDENT_AMBULATORY_CARE_PROVIDER_SITE_OTHER): Payer: Medicare Other | Admitting: Cardiology

## 2022-01-29 ENCOUNTER — Telehealth: Payer: Self-pay | Admitting: *Deleted

## 2022-01-29 ENCOUNTER — Ambulatory Visit: Payer: Medicare Other | Admitting: Cardiology

## 2022-01-29 VITALS — BP 110/60 | HR 63 | Ht 66.0 in | Wt 117.8 lb

## 2022-01-29 DIAGNOSIS — I341 Nonrheumatic mitral (valve) prolapse: Secondary | ICD-10-CM

## 2022-01-29 DIAGNOSIS — I10 Essential (primary) hypertension: Secondary | ICD-10-CM

## 2022-01-29 DIAGNOSIS — I5032 Chronic diastolic (congestive) heart failure: Secondary | ICD-10-CM | POA: Diagnosis not present

## 2022-01-29 DIAGNOSIS — I340 Nonrheumatic mitral (valve) insufficiency: Secondary | ICD-10-CM | POA: Diagnosis not present

## 2022-01-29 DIAGNOSIS — I878 Other specified disorders of veins: Secondary | ICD-10-CM

## 2022-01-29 DIAGNOSIS — Z8679 Personal history of other diseases of the circulatory system: Secondary | ICD-10-CM

## 2022-01-29 DIAGNOSIS — R0602 Shortness of breath: Secondary | ICD-10-CM | POA: Diagnosis not present

## 2022-01-29 DIAGNOSIS — Z9889 Other specified postprocedural states: Secondary | ICD-10-CM

## 2022-01-29 DIAGNOSIS — I480 Paroxysmal atrial fibrillation: Secondary | ICD-10-CM | POA: Diagnosis not present

## 2022-01-29 DIAGNOSIS — Z79899 Other long term (current) drug therapy: Secondary | ICD-10-CM | POA: Diagnosis not present

## 2022-01-29 LAB — BASIC METABOLIC PANEL
BUN/Creatinine Ratio: 30 — ABNORMAL HIGH (ref 12–28)
BUN: 22 mg/dL (ref 8–27)
CO2: 29 mmol/L (ref 20–29)
Calcium: 9.5 mg/dL (ref 8.7–10.3)
Chloride: 102 mmol/L (ref 96–106)
Creatinine, Ser: 0.73 mg/dL (ref 0.57–1.00)
Glucose: 77 mg/dL (ref 70–99)
Potassium: 4.3 mmol/L (ref 3.5–5.2)
Sodium: 144 mmol/L (ref 134–144)
eGFR: 85 mL/min/{1.73_m2} (ref >59)

## 2022-01-29 MED ORDER — FUROSEMIDE 40 MG PO TABS: 40.0000 mg | ORAL_TABLET | Freq: Every day | ORAL | 1 refills | Status: DC

## 2022-01-29 NOTE — Telephone Encounter (Signed)
-----   Message from Freada Bergeron, MD sent at 01/29/2022 12:20 PM EDT ----- Thank you so much!  ----- Message ----- From: Lelon Perla, MD Sent: 01/29/2022   8:52 AM EDT To: Nuala Alpha, LPN; Cristopher Estimable, RN; #  I would be happy to see her; will schedule her for six months Kirk Ruths  ----- Message ----- From: Freada Bergeron, MD Sent: 01/29/2022   8:35 AM EDT To: Nuala Alpha, LPN; Cristopher Estimable, RN; #  Signe Colt,  Do you go out to [facility] and are you willing to take a new patient on? This patient has trouble driving to [place] with her neck/back issues and is hoping to see someone closer to home.   She is a complex cookie with history of MVR, afib, diastolic heart failure, depression/anxiety. Has a lot of chronic pain, which causes her a lot of trouble. She likes to be seen pretty regularly.   If you have no space, that is totally fine! She can still make it out here.   Thank you!  -[name]

## 2022-01-29 NOTE — Telephone Encounter (Signed)
Pt saw Dr. Pemberton in clinic today for complaints.  See OV note from today for further details.  

## 2022-01-29 NOTE — Progress Notes (Signed)
Cardiology Office Note:    Date:  01/29/2022   ID:  [name], [name] [birth year], MRN [id number]  PCP:  Regina Minium, MD   [facility] Providers Cardiologist:  Regina Bergeron, MD Electrophysiologist:  Regina Haw, MD  { Referring MD: Regina Minium, MD   History of Present Illness:    [name] is a 76 y.o. female with a hx of severe MR s/p MV repair (32 mm Sorin Memo 4D ring annuloplasty valve) complicated by LV free wall laceration requiring median sternotomy, pAfib, scoliosis requiring multiple spine surgeries and anxiety who presents to clinic for follow-up.   Per review of the record, patient has been followed for MVP/MR. TEE on 11/21/19 demonstrated presence of mitral valve prolapse with a large flail segment felt to be the A3 portion of the anterior leaflet with severe mitral regurgitation. She was seen by [name] and underwent Minimally Invasive Mitral Valve Repair, Complete MAZE procedure, and Sternotomy with repair of Left Ventricular Free- wall laceration on 01/17/20.  She was extubated the evening of surgery. She was started on coumadin for her Mitral Valve Repair.  Her post-op course was complicated by acute blood loss anemia requiring blood transfusions and Afib with RVR for which she was placed on Amiodarone and lopressor and her tikosyn was discontinued. She follows with [name] in clinic and has since been transitioned to [name].   Seen in clinic 08/23/20 where she was having right sided chest pain/burning where her incision site was located. She was very concerned that it may be related to her heart or breast cancer. Given localization around scar tissue site, nonexertional nature, and no masses, suspected it was likely related to neuropathic pain and scar tissue. CTA, chest xray, d-dimer all reassuringly normal. Given her significant allergies to medications, we recommended she follow-up with her chronic pain physician.  Saw Dr. Roxy [name] on  01/15/21 where she was deemed to be stable from a CV standpoint.  Was seen in clinic on 01/23/21 where she was doing to complain of intermittent shortness of breath, chronic pain and depression. No changes in meds at that time.  Seen in clinic 04/2021 by [name] for Afib. At that visit, she was back in NSR and she was continued on metop and xarelto.   Was last seen in clinic on 09/2021 where he was complaining of mild exertional SOB and knife like cutting pain in her upper back. We obtained a BNP which was elevated to 1050. We increased her lasix to '20mg'$  BIDx 3days and then daily thereafter.  She called the after hours line on 01/25/22 for chest pain and SOB. She was instructed to go to the ER. Her trop was negative. NT pro BNP 1182.   Today, the patient states that her chest pain and shortness of breath have been ongoing for the past 3 weeks. She "has no breath whatsoever" when walking to the restroom. She is not able to walk for prolonged times, such as going to a store. She self increased her lasix to '40mg'$  daily and this has helped.  She continues to have sharp chest pain radiating to her back. This has been ongoing for years and we discussed that her work-up has been reassuring from a cardiac perspective and her pain is likely related to neuropathic pain from her back.  She is caring for her son who is very sick. She is concerned about staying healthy for him, and wants to address congestive heart failure. We  discussed at length her history, symptoms, and definitions of heart failure.  At home she does not routinely check her blood pressure. When she does she usually notices lower readings. Taking metoprolol also helps to lower her blood pressure.  Additionally she complains of significant hoarseness of her voice, sometimes making it difficult to talk. She is scheduled to see an ENT specialist for her hoarseness, allergies, and rhinorrhea.   She denies any palpitations, lightheadedness,  headaches, syncope, orthopnea, or PND.  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   "right"   Mitral regurgitation    Osteopenia 02/18/2012   Osteoporosis    PAF (paroxysmal atrial fibrillation) ([place]) 2012   Peripheral neuropathy 11/06/2011   PMDD (premenstrual dysphoric disorder)    PONV (postoperative nausea and vomiting)    nausea, vomiting, hives and dizziness    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/2020   Complete bilateral atrial lesion set using cryothermy and bipolar radiofrequency ablation with clipping of LA appendage via right mini-thoracotomy approach   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   Complex valvuloplasty including artificial Gore-tex neochord placement x12 with 74m Sorin Memo 4D ring annuloplasty   Seasonal allergies    Vaginal delivery    ONE NSVD   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    Past Surgical History:  Procedure Laterality Date   ANTERIOR CERVICAL DECOMP/DISCECTOMY FUSION  ~ 2003   BACK SURGERY     BREAST SURGERY     BREAST BIOPSY--RIGHT BENIGN   BUNIONECTOMY Bilateral    CARDIOVERSION N/A 01/26/2020   Procedure: CARDIOVERSION;  Surgeon: OGeralynn Rile MD;  [place]  Service: Cardiovascular;  Laterality: N/A;   COSMETIC SURGERY  2016   "back of my neck; related to earlier fusion"   [place] "[place] times"   [place]    "removed bone  protruding out of my forehead"   HARDWARE REMOVAL  2016   "related to neck OR"   INCONTINENCE SURGERY     IR THORACENTESIS ASP PLEURAL SPACE W/IMG GUIDE  02/16/2020   MINIMALLY INVASIVE MAZE PROCEDURE N/A 01/17/2020   Procedure: MINIMALLY INVASIVE MAZE PROCEDURE;  Surgeon: ORexene Alberts MD;  [place]  Service: Open Heart Surgery;  Laterality: N/A;   MITRAL VALVE REPAIR Right 01/17/2020   Procedure: MINIMALLY INVASIVE MITRAL VALVE REPAIR (MVR) USING MEMO 4D 32MM;  Surgeon: ORexene Alberts MD;  [place]  Service: Open Heart Surgery;  Laterality: Right;   POSTERIOR CERVICAL FUSION/FORAMINOTOMY  ~ 2008; 2015   RIGHT/LEFT HEART CATH AND CORONARY ANGIOGRAPHY N/A 12/02/2019   Procedure: RIGHT/LEFT HEART CATH AND CORONARY ANGIOGRAPHY;  Surgeon: MBurnell Blanks MD;  [place];  Service: Cardiovascular;  Laterality: N/A;   SHOULDER ARTHROSCOPY W/ ROTATOR CUFF REPAIR Right 2012   SPINAL FUSION  06/2014 - 2018  X ?7   "scoliosis; my entire back"   TEE WITHOUT CARDIOVERSION N/A 11/21/2019   Procedure: TRANSESOPHAGEAL ECHOCARDIOGRAM (TEE);  Surgeon: Josue Hector, MD;  [facility] END[facility];  Service: Cardiovascular;  Laterality: N/A;   TEE WITHOUT CARDIOVERSION N/A 01/17/2020   Procedure: TRANSESOPHAGEAL ECHOCARDIOGRAM (TEE);  Surgeon: Rexene Alberts, MD;  Location: [place];  Service: Open Heart Surgery;  Laterality: N/A;   TEE WITHOUT CARDIOVERSION N/A 01/26/2020   Procedure: TRANSESOPHAGEAL ECHOCARDIOGRAM (TEE);  Surgeon: Geralynn Rile, MD;  Location: [place];  Service: Cardiovascular;  Laterality: N/A;   TUBAL LIGATION     VAGINAL HYSTERECTOMY     TVH    Current Medications: Current Meds  Medication Sig   acetaminophen (TYLENOL) 500 MG tablet Take 500 mg by mouth every 6 (six) hours as needed for moderate pain.    amLODipine (NORVASC) 5 MG tablet Take 1 tablet by mouth once daily   apixaban (ELIQUIS) 5 MG TABS tablet Take 1 tablet (5 mg  total) by mouth 2 (two) times daily.   b complex vitamins tablet Take 1 tablet by mouth daily.   Calcium Carbonate Antacid (TUMS CHEWY BITES PO) Take 1 tablet by mouth daily as needed (reflux).    Calcium Citrate-Vitamin D (CALCIUM + D PO) Take 1 tablet by mouth daily.   Carboxymethylcellul-Glycerin (LUBRICATING EYE DROPS OP) Place 1 drop into both eyes daily as needed (dry eyes).   clonazePAM (KLONOPIN) 0.5 MG tablet Take 1 tablet by mouth twice daily as needed for anxiety   cyclobenzaprine (FLEXERIL) 10 MG tablet Take 10 mg by mouth at bedtime as needed for muscle spasms.    denosumab (PROLIA) 60 MG/ML SOSY injection Inject 60 mg into the skin every 6 (six) months.   furosemide (LASIX) 40 MG tablet Take 1 tablet (40 mg total) by mouth daily.   levothyroxine (SYNTHROID) 75 MCG tablet Take 1 tablet by mouth once daily   meclizine (ANTIVERT) 25 MG tablet TAKE 1 TABLET BY MOUTH THREE TIMES DAILY AS NEEDED FOR DIZZINESS   metoprolol tartrate (LOPRESSOR) 50 MG tablet Take 1 tablet (50 mg total) by mouth 2 (two) times daily.   Nutritional Supplements (,FEEDING SUPPLEMENT, PROSOURCE PLUS) liquid Take 30 mLs by mouth 2 (two) times daily between meals.   nystatin (MYCOSTATIN/NYSTOP) powder Apply topically 2 (two) times daily as needed (skin irritation (under breasts)).   ondansetron (ZOFRAN) 4 MG tablet Take 1 tablet (4 mg total) by mouth every 8 (eight) hours as needed.   oxyCODONE (ROXICODONE) 5 MG/5ML solution Take 4 mLs (4 mg total) by mouth every 4 (four) hours as needed for moderate pain.   polyethylene glycol (MIRALAX / GLYCOLAX) packet Take 17 g by mouth daily as needed for mild constipation.   promethazine (PHENERGAN) 25 MG tablet Take 1 tablet (25 mg total) by mouth 2 (two) times daily as needed for nausea or vomiting.   sodium chloride (OCEAN) 0.65 % SOLN nasal spray Place 1 spray into both nostrils as needed for congestion (nose bleeds).   valACYclovir (VALTREX) 500 MG tablet Take 500 mg by  mouth daily as needed (breakouts).    [DISCONTINUED] furosemide (LASIX) 20 MG tablet Take 1 tablet (20 mg total) by mouth daily. If you gain weight (>2lbs in 1-2 days or 3-5lbs in 3-5 days), you may take an extra dose of lasix at that time until your weight is back to baseline.     Allergies:   Buprenorphine, Erythromycin, Iodinated contrast media, Latex, Other, Tape, Ciprofloxacin, Duloxetine hcl,  Erythromycin base, Hydrocodone, Hydromorphone, Iodine, Levofloxacin, Metrizamide, Nsaids, Nucynta [tapentadol hcl], Oxycodone, Pentazocine, Septra [bactrim], Sulfasalazine, Morphine, Sulfa antibiotics, Sulfamethoxazole-trimethoprim, and Tapentadol   Social History   Socioeconomic History   Marital status: Divorced    Spouse name: none/divorced   Number of children: Not on file   Years of education: Not on file   Highest education level: 12th grade  Occupational History   Occupation: retired  Tobacco Use   Smoking status: Former    Packs/day: 0.10    Years: 14.00    Total pack years: 1.40    Types: Cigarettes    Quit date: 1980    Years since quitting: 43.5   Smokeless tobacco: Never   Tobacco comments:    03/31/2018 "quit ~ 1980; someday smoker when I did smoke; never addicted"  Vaping Use   Vaping Use: Never used  Substance and Sexual Activity   Alcohol use: Never   Drug use: Yes    Types: Oxycodone, Benzodiazepines    Comment: 03/31/2018 "for chronic neck and back pain", takes Klonopin at times.    Sexual activity: Not Currently  Other Topics Concern   Not on file  Social History Narrative   Lives by herself.    Social Determinants of Health   Financial Resource Strain: Low Risk  (10/10/2021)   Overall Financial Resource Strain (CARDIA)    Difficulty of Paying Living Expenses: Not hard at all  Food Insecurity: No Food Insecurity (10/10/2021)   Hunger Vital Sign    Worried About Running Out of Food in the Last Year: Never true    Ran Out of Food in the Last Year: Never true   Transportation Needs: No Transportation Needs (10/10/2021)   PRAPARE - Hydrologist (Medical): No    Lack of Transportation (Non-Medical): No  Physical Activity: Inactive (10/10/2021)   Exercise Vital Sign    Days of Exercise per Week: 0 days    Minutes of Exercise per Session: 0 min  Stress: No Stress Concern Present (10/10/2021)   [place]    Feeling of Stress : Not at all  Social Connections: Socially Isolated (10/10/2021)   Social Connection and Isolation Panel [NHANES]    Frequency of Communication with Friends and Family: Twice a week    Frequency of Social Gatherings with Friends and Family: Twice a week    Attends Religious Services: Never    Marine scientist or Organizations: No    Attends Music therapist: Never    Marital Status: Divorced     Family History: The patient's family history includes Arthritis in her mother; Diabetes in her brother, brother, and father; Heart Problems in her brother; Heart disease in her mother, sister, and sister; Hyperlipidemia in her sister and sister; Stroke in her sister; Uterine cancer in her mother.  ROS:   Please see the history of present illness.    Review of Systems  Constitutional:  Negative for chills, fever and malaise/fatigue.  HENT:  Negative for tinnitus.   Eyes:  Negative for blurred vision.  Respiratory:  Positive for shortness of breath.   Cardiovascular:  Positive for chest pain (central chest) and leg swelling (bilateral ankles). Negative for palpitations, orthopnea, claudication and PND.  Gastrointestinal:  Negative for abdominal pain, nausea and vomiting.  Genitourinary:  Negative for hematuria.  Musculoskeletal:  Positive for back pain, myalgias and neck pain.  Neurological:  Positive for tremors. Negative for dizziness, loss  of consciousness and weakness.  Psychiatric/Behavioral:  Negative for depression.       EKGs/Labs/Other Studies Reviewed:    The following studies were reviewed today:  TTE Limited 10/22/2020:  1. Left ventricular ejection fraction, by estimation, is 50 to 55%. The  left ventricle has low normal function. The left ventricle has no regional  wall motion abnormalities.   2. The right ventricular size is mildly enlarged.   3. The mitral valve has been repaired/replaced. Trivial mitral valve  regurgitation. There is a 32 mm sorin Memo 4D prosthetic annuloplasty ring  present in the mitral position. Mean gradient 97mHg at HR 67bpm. Procedure  Date: 01/17/20.   4. The aortic valve is tricuspid. There is mild calcification of the  aortic valve. There is mild thickening of the aortic valve. Aortic valve  regurgitation is mild. Mild aortic valve sclerosis is present, with no  evidence of aortic valve stenosis.   5. The inferior vena cava is dilated in size with <50% respiratory  variability, suggesting right atrial pressure of 15 mmHg.   Comparison(s): Compared to prior echo on 02/2020, the LVEF is slightly  lower at 50-55% (still within normal range). Mean gradient across the  mitral valve remains stable at 432mg (previoulsy 33m61m). RAP is now higher  at 33m533mas well.   TEE 01/26/20: IMPRESSIONS   1. Left ventricular ejection fraction, by estimation, is 55 to 60%. The  left ventricle has normal function. The left ventricle has no regional  wall motion abnormalities.   2. Right ventricular systolic function is normal. The right ventricular  size is normal.   3. S/p surgical LAA clipping. No residual connection noted with the LA.  No LA thrombus. No left atrial/left atrial appendage thrombus was  detected.   4. 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 No residual MR. MVA by  direct 3D MPR assessment 2.6 cm2. The mitral valve has been  repaired/replaced. No evidence of mitral valve regurgitation. No evidence  of mitral stenosis. The mean mitral valve  gradient is 4.0  mmHg with average heart rate of 111 bpm. There is a 32 mm  prosthetic annuloplasty ring present in the mitral position. Procedure  Date: 01/17/2020.   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   6. The aortic valve is tricuspid. Aortic valve regurgitation is not  visualized. No aortic stenosis is present.   TTE 02/29/20: IMPRESSIONS   1. There has been no change since the last study on 01/26/2020. Normal  LVEF 60-65% and normal transmitral gradients, mean 4 mmHg.   2. Left ventricular ejection fraction, by estimation, is 60 to 65%. The  left ventricle has normal function. The left ventricle has no regional  wall motion abnormalities. Left ventricular diastolic function could not  be evaluated.   3. Right ventricular systolic function is normal. The right ventricular  size is normal. There is mildly elevated pulmonary artery systolic  pressure. The estimated right ventricular systolic pressure is 41.201.0g.   4. The mitral valve has been repaired/replaced. Trivial mitral valve  regurgitation. No evidence of mitral stenosis. The mean mitral valve  gradient is 3.0 mmHg. There is a 32 mm Sorin Memo 4D ring annuloplasty  present in the mitral position. Procedure  Date: 01/17/20.   5. Tricuspid valve regurgitation is mild to moderate.   6. The aortic valve is normal in structure. Aortic valve regurgitation is  trivial. No aortic stenosis is present.   7. The inferior vena cava is normal in size  with greater than 50%  respiratory variability, suggesting right atrial pressure of 3 mmHg.   RHC/LHC 12/02/19: Prox RCA lesion is 20% stenosed. Dist LAD lesion is 20% stenosed.   1. Mild non-obstructive CAD   Recommendation: Continue planning for mitral valve repair.     Most Recent Value  Fick Cardiac Output 5.84 L/min  Fick Cardiac Output Index 3.58 (L/min)/BSA  RA A Wave 2 mmHg  RA V Wave 1 mmHg  RA Mean 0 mmHg  RV Systolic Pressure 29 mmHg  RV Diastolic Pressure -2 mmHg  RV EDP 3 mmHg  PA  Systolic Pressure 32 mmHg  PA Diastolic Pressure 10 mmHg  PA Mean 21 mmHg  PW A Wave 22 mmHg  PW V Wave 20 mmHg  PW Mean 13 mmHg  AO Systolic Pressure 250 mmHg  AO Diastolic Pressure 61 mmHg  AO Mean 88 mmHg  LV Systolic Pressure 539 mmHg  LV Diastolic Pressure 1 mmHg  LV EDP 9 mmHg  AOp Systolic Pressure 767 mmHg  AOp Diastolic Pressure 66 mmHg  AOp Mean Pressure 93 mmHg  LVp Systolic Pressure 341 mmHg  LVp Diastolic Pressure 0 mmHg  LVp EDP Pressure 7 mmHg       EKG:  EKG is personally reviewed. 01/29/2022:  EKG was not ordered. 04/25/2021 ([name]): AFIB 85 at beginning of visit, after auscultation, she sounds very regular and I repeated EKG and it showed sinus rhythm 65.  Recent Labs: 09/26/2021: NT-Pro BNP 1,050 10/31/2021: ALT 22; BUN 22; Creatinine, Ser 0.76; Hemoglobin 13.4; Platelets 187.0; Potassium 3.9; Sodium 144; TSH 0.36   Recent Lipid Panel    Component Value Date/Time   CHOL 175 12/10/2020 1338   TRIG 79.0 12/10/2020 1338   HDL 80.40 12/10/2020 1338   CHOLHDL 2 12/10/2020 1338   VLDL 15.8 12/10/2020 1338   LDLCALC 79 12/10/2020 1338   LDLDIRECT 136.5 06/08/2013 1049     Risk Assessment/Calculations:    CHA2DS2-VASc Score = 6  This indicates a 9.7% annual risk of stroke. The patient's score is based upon: CHF History: 1 HTN History: 1 Diabetes History: 0 Stroke History: 0 Vascular Disease History: 1 Age Score: 2 Gender Score: 1         Physical Exam:    VS:  BP 110/60   Pulse 63   Ht '5\' 6"'$  (1.676 m)   Wt 117 lb 12.8 oz (53.4 kg)   SpO2 97%   BMI 19.01 kg/m     Wt Readings from Last 3 Encounters:  01/29/22 117 lb 12.8 oz (53.4 kg)  11/28/21 117 lb 9.6 oz (53.3 kg)  11/18/21 116 lb (52.6 kg)     GEN: Elderly frail female, comfortable, NAD HEENT: Normal NECK: No JVD; No carotid bruits CARDIAC: RRR, 2/6 systolic murmur (unchanged) RESPIRATORY:  CTAB, no wheezes ABDOMEN: Soft, non-tender, non-distended MUSCULOSKELETAL:  Chronic  venous stasis changes, no significant edema SKIN: Warm and dry NEUROLOGIC:  Alert and oriented x 3 PSYCHIATRIC:  Depressed  ASSESSMENT:    1. Chronic diastolic heart failure ([place])   2. S/P mitral valve repair   3. S/P Maze operation for atrial fibrillation   4. Medication management   5. Hypertension, unspecified type   6. Mitral regurgitation due to cusp prolapse   7. Chronic venous stasis   8. Severe mitral regurgitation   9. Paroxysmal atrial fibrillation (HCC)   10. SOB (shortness of breath)      PLAN:    In order of problems listed above:  #Severe  MR s/p Mitral Valve Repair: Stable on TTE 02/2020 with LVEF 60-65%, normal RV size and function, RVSP 15mHg, mean gradient across mitral 322mg, trivial MR. Continues to have some shortness of breath with exertion, but overall stable. Likely related to deconditioning with multiple surgeries for her back and recent MVR. No significant CAD on cath.  -S/p MV repair 01/2020; trivial MR with mean gradient 1m87m in 10/2020 which is stable -Repeat TTE for monitoring -Will increase lasix to '40mg'$  daily and check BMET today -Continue IE prophylaxis for dental procedures   #Chronic Diastolic HF: #DOE: TTE 10/15/23/42th LVEF 50-55%, mean MV gradient 1mm20m trivial MR. Has had chronic dyspnea that has been ongoing for years despite MVR. Acutely worsened in the setting of mild volume overload which has since improved with higher dose of lasix. Discussed adding spiro/SGLT2i but given history of extensive medication intolerances/allergies, she has declined starting these at this time. -Continue lasix '40mg'$  daily -BMET today -Declined spiro/sglt2i due to significant medication intolerances -Low Na diet  #Paroxysmal Afib: CHADs-vasc 6. Managed by Dr. CamnCurt Bearsff amiodarone  -Did not tolerate metop succinate; continue metop tartrate '50mg'$  BID -Continue apixaban '5mg'$  BID   #Nonobstructive CAD: Pre-operative cath 11/2019 with mild disease (20%)  in RCA and LAD. -Patient declined statin therapy as she has significant medication reactions and wants to avoiding trying something new   #Scoliosis: #LE weakness #Neuropathy S/p multiple correctional back surgeries. -Follow-up with primary provider as scheduled -Pain management following   #Depression: -Continue counseling -Follow-up with PCP as scheduled  #Chronic venous stasis: -Continue compression socks   Follow-up:  [facility]  Medication Adjustments/Labs and Tests Ordered: Current medicines are reviewed at length with the patient today.  Concerns regarding medicines are outlined above.   Orders Placed This Encounter  Procedures   Basic metabolic panel   ECHOCARDIOGRAM COMPLETE   Meds ordered this encounter  Medications   furosemide (LASIX) 40 MG tablet    Sig: Take 1 tablet (40 mg total) by mouth daily.    Dispense:  90 tablet    Refill:  1    Dose increase   Patient Instructions  Medication Instructions:   INCREASE YOUR FUROSEMIDE (LASIX) TO 40 MG BY MOUTH DAILY  *If you need a refill on your cardiac medications before your next appointment, please call your pharmacy*   Lab Work:  TODAY--BMET  If you have labs (blood work) drawn today and your tests are completely normal, you will receive your results only by: [place] you have MyChart) OR A paper copy in the mail If you have any lab test that is abnormal or we need to change your treatment, we will call you to review the results.   Testing/Procedures:  Your physician has requested that you have an echocardiogram. Echocardiography is a painless test that uses sound waves to create images of your heart. It provides your doctor with information about the size and shape of your heart and how well your heart's chambers and valves are working. This procedure takes approximately one hour. There are no restrictions for this  procedure.    Follow-Up:  DR. PEMBJohney FrameL BE REACHING OUT TO DR. CRENSHAW WHO GOES OUT TO OUR [place] LOCATION, TO SEE IF HE WOULD BE ABLE TO FOLLOW YOU OUT THAT WAY FOR COMMUTE REASONS--DR. Huriel Matt WILL ALSO CHECK INTO OUR [place] LOCATION AS WELL.        I,Mathew Stumpf,acting as a scriEducation administrator  Regina Bergeron, MD.,have documented all relevant documentation on the behalf of Regina Bergeron, MD,as directed by  Regina Bergeron, MD while in the presence of Regina Bergeron, MD.  I, Regina Bergeron, MD, have reviewed all documentation for this visit. The documentation on 01/29/22 for the exam, diagnosis, procedures, and orders are all accurate and complete.   Signed, Regina Bergeron, MD  01/29/2022 9:31 AM    [place]

## 2022-01-29 NOTE — Telephone Encounter (Signed)
Pt saw Dr. Johney Frame in clinic today for complaints.  See OV note from today, for further details.

## 2022-01-29 NOTE — Patient Instructions (Addendum)
Medication Instructions:   INCREASE YOUR FUROSEMIDE (LASIX) TO 40 MG BY MOUTH DAILY  *If you need a refill on your cardiac medications before your next appointment, please call your pharmacy*   Lab Work:  TODAY--BMET  If you have labs (blood work) drawn today and your tests are completely normal, you will receive your results only by: [place] (if you have MyChart) OR A paper copy in the mail If you have any lab test that is abnormal or we need to change your treatment, we will call you to review the results.   Testing/Procedures:  Your physician has requested that you have an echocardiogram. Echocardiography is a painless test that uses sound waves to create images of your heart. It provides your doctor with information about the size and shape of your heart and how well your heart's chambers and valves are working. This procedure takes approximately one hour. There are no restrictions for this procedure.    Follow-Up:  DR. Johney Frame WILL BE REACHING OUT TO DR. CRENSHAW WHO GOES OUT TO OUR [place] LOCATION, TO SEE IF HE WOULD BE ABLE TO FOLLOW YOU OUT THAT WAY FOR COMMUTE REASONS--DR. PEMBERTON WILL ALSO CHECK INTO OUR [place] LOCATION AS WELL.

## 2022-01-30 ENCOUNTER — Telehealth: Payer: Self-pay | Admitting: Cardiology

## 2022-01-30 NOTE — Telephone Encounter (Signed)
Pt would like a callback regarding lab results. Please advise

## 2022-01-30 NOTE — Telephone Encounter (Signed)
Returned call to patient and discussed lab results.  Per Dr. Johney Frame: Her electrolytes and kidney function look great! We will continue the '40mg'$  of lasix. I spoke to Dr. Stanford Breed who works out in [employer] and he is happy to see her. They will get her scheduled out there in the next 6 months.  Patient verbalized understanding. Patient states she will sometimes not take her Lasix if she has appointments in the mornings/afternoons to prevent frequent urination during medical appts, and she asked if she should be taking her Lasix later in the day in this situation. Advised patient it would be better to take her Lasix later in the day rather than not at all to prevent her having increased SOB, but to be aware that the later she takes her Lasix the later she will be up having to urinate. Patient verbalized understanding.

## 2022-02-03 ENCOUNTER — Telehealth: Payer: Self-pay | Admitting: Cardiology

## 2022-02-03 ENCOUNTER — Encounter: Payer: Self-pay | Admitting: Family Medicine

## 2022-02-03 NOTE — Telephone Encounter (Signed)
I made the pt an apt for Thursday 02/06/2022 @ 1pm

## 2022-02-03 NOTE — Telephone Encounter (Signed)
Pt is calling back for the 2nd time today, not with active complaints, but to ask if she can go ahead and schedule her next follow-up appt with her new Gen Cards, Dr. Stanford Breed.  She is calling to ask if she could go ahead and schedule her 6 month follow-up appt with her new Cardiologist she will be establishing with, Dr. Stanford Breed.  Pt will be seeing him in 6 months at our [facility] location, for this will be an easier commute for her.  Both Providers agreed to switch.   Pt states she gets anxious not knowing which office to call for her complaints, and not having a designated scheduled appt with her Cardiologist.   She states "I just wanted to get all my appts squared away."  Informed the pt that I am unsure if Dr. Jacalyn Lefevre 6 month schedule for [facility] is available to schedule on at this time, for a lot of Providers are having these schedules finalized at this time, and are awaiting review.  Advised the pt that I can message Dr. Jacalyn Lefevre RN to inquire if his 6 month schedule for [facility] is available at this time and have her arrange if open.  Did place a recall in the system at last weeks OV with the pt.  She was made aware at that time that she would be notified to schedule this appt, when open.  Advised the pt that she should continue her current regimen and keep her echo as scheduled for 8/8.  Advised her to make sure she is lowering her salt intake, wearing her compressions during the day, and take her increased lasix 40 mg po daily, as advised by Dr. Johney Frame at last weeks OV.  Advised the pt to continue following up with her PCP about Chest CT order for lung nodule, as advised at her last ER visit with [facility].    Reassured her that all appts are scheduled appropriately and accordingly.    Pt verbalized understanding and agrees with this plan.  Pt states she is feeling much better now just talking again on the phone and reassuring her of all upcoming appts.

## 2022-02-03 NOTE — Telephone Encounter (Signed)
Pt c/o Shortness Of Breath: STAT if SOB developed within the last 24 hours or pt is noticeably SOB on the phone  1. Are you currently SOB (can you hear that pt is SOB on the phone)?  No  2. How long have you been experiencing SOB?  Within the last month or so  3. Are you SOB when sitting or when up moving around?   Moving around and sitting  4. Are you currently experiencing any other symptoms?     Patient stated she is concerned that they have doubled her fluid medication and her SOB is still bad.

## 2022-02-03 NOTE — Telephone Encounter (Signed)
Spoke with Dr. Johney Frame about pts concerns and questions this morning.  Per Dr. Johney Frame, she advised that the [name] should follow-up with her PCP to inquire if getting a CHEST CT is indicated or not at this time.   Appears when the [name] went to the ER at [facility], the ER Provider noted in his discharge that small nodular density lateral right midlung, and non-emergent chest CT could be helpful in further evaluations.   Dr. Johney Frame saw [name] last week for post-ER visit and she ordered for her to get an echo done on 8/8.   Per Dr. Johney Frame, the [name] should follow-up with her PCP as well for post-ER follow-up, and inquire at that visit if a chest CT needs to be ordered or not.   [name] aware of these recommendations.  She states she has an appt with Dr. [name] PCP for the end of Aug, but will call her today and ask if this appt can be moved up to a sooner date in August, so that PCP can look more into ordering a Chest CT for lung nodule noted.  [name] will keep her 8/8 echo appt as scheduled.  She will be establishing care soon with another Cardiologist in our Practice, Dr. Stanford Breed, for easier commute.  [name] verbalized understanding and agrees with this plan.

## 2022-02-03 NOTE — Telephone Encounter (Signed)
Thank you so much. Agree with your plan!

## 2022-02-03 NOTE — Telephone Encounter (Signed)
Patient is calling stating at her last hospital visit the Cardiologist that evaluated her found a lung nodule and advised her she is needing a chest CT. Patient is wanting to know how she needs to go about scheduling this and who she is to follow up with regarding it. Please advise.

## 2022-02-03 NOTE — Telephone Encounter (Signed)
Spoke with pt, aware schedule is not open at this time. She is aware to call if she has issues prior to appointment.

## 2022-02-04 ENCOUNTER — Encounter: Payer: Self-pay | Admitting: Cardiology

## 2022-02-04 ENCOUNTER — Telehealth: Payer: Self-pay | Admitting: Family Medicine

## 2022-02-04 NOTE — Telephone Encounter (Signed)
Caller name: [name] (pt)  On DPR? :yes/no: Yes  Call back number: [phone]  Provider they see: Dr. [name]  Reason for call: Pt calling to f/u on mychart message she sent 02/03/22 re: getting CT scan of chest as recommend by physician at [facility]. Pt has apptmt 02/06/22 w/ Dr. [name].  She just want to know what to do re: Ct scan.

## 2022-02-04 NOTE — Telephone Encounter (Signed)
Pt is coming in to see [name] on Thursday to discuss concerns

## 2022-02-06 ENCOUNTER — Encounter: Payer: Self-pay | Admitting: Family Medicine

## 2022-02-06 ENCOUNTER — Telehealth: Payer: Self-pay | Admitting: Pharmacist

## 2022-02-06 ENCOUNTER — Telehealth: Payer: Self-pay | Admitting: Cardiology

## 2022-02-06 ENCOUNTER — Ambulatory Visit (INDEPENDENT_AMBULATORY_CARE_PROVIDER_SITE_OTHER): Payer: Medicare Other | Admitting: Family Medicine

## 2022-02-06 VITALS — BP 128/76 | HR 67 | Temp 97.4°F | Resp 16 | Ht 66.0 in | Wt 115.2 lb

## 2022-02-06 DIAGNOSIS — R911 Solitary pulmonary nodule: Secondary | ICD-10-CM | POA: Diagnosis not present

## 2022-02-06 NOTE — Telephone Encounter (Signed)
Pt was calling to ask does she continue her lasix 40 mg po daily for the next 6 months, until she establishes with Dr. Stanford Breed then.  She had a bmet done on 7/26 and this was normal and we advised her to continue her current regimen.   Advised the pt that she continues all her cardiac meds until she is seen again, or until otherwise advised by a Provider, if unknown circumstances occur.   She stated Dr. Jacalyn Lefevre office called her yesterday to inform her that they will be calling her soon to schedule her 6 month appt with them, once schedules finalized.   She states she is doing well and lasix is working well.  She reports her weights are down and swelling is gone.   Pt verbalized understanding and agrees with this plan.

## 2022-02-06 NOTE — Assessment & Plan Note (Addendum)
New.  Noted on CXR done at [facility] on 01/25/22.  Previous imaging noted prior nodules but those were felt to be more consistent w/ atypical infxn.  Will get CT to better assess.  Pt is understandably anxious and has quite a bit of underlying anxiety to begin with.  Spent 27 minutes w/ pt, >50% spent counseling on next steps.  Pt expressed understanding and is in agreement w/ plan.

## 2022-02-06 NOTE — Telephone Encounter (Signed)
Pt c/o medication issue:  1. Name of Medication: furosemide (LASIX) 40 MG tablet  2. How are you currently taking this medication (dosage and times per day)? Take 1 tablet (40 mg total) by mouth daily  3. Are you having a reaction (difficulty breathing--STAT)? no  4. What is your medication issue?  Calling in to see who going to monitor her lasix. Concern about taking it everyday.

## 2022-02-06 NOTE — Progress Notes (Signed)
   Subjective:    Patient ID: [name], female    DOB: 1946/06/25, 76 y.o.   MRN: [id number]  HPI R lung nodule- pt had CXR done at [place] on 01/25/22 that showed 'small nodular density later R midlung'.  They could not exclude a lung nodule.  Recommended chest CT.  Pt reports she has 'always wondered if there was something wrong w/ my lungs'.     Review of Systems For ROS see HPI     Objective:   Physical Exam Vitals reviewed.  Constitutional:      General: She is not in acute distress.    Appearance: Normal appearance. She is not ill-appearing.  HENT:     Head: Normocephalic and atraumatic.  Musculoskeletal:     Right lower leg: No edema.     Left lower leg: No edema.  Skin:    General: Skin is warm and dry.  Neurological:     Mental Status: She is alert and oriented to person, place, and time. Mental status is at baseline.  Psychiatric:     Comments: Tearful, anxious           Assessment & Plan:

## 2022-02-06 NOTE — Progress Notes (Signed)
Chronic Care Management Pharmacy Assistant   Name: [name]  MRN: [id number] DOB: 1945-09-18   Reason for Encounter: Disease State - Hypertension Call    Recent office visits:  11/28/21 [name], MD - Family Medicine - Anxiety and Depression - Continue the Mirtazapine 1/2 tab nightly x2 more weeks. Then increase to 1 tab nightly. Follow up in 3 months.   Recent consult visits:  01/29/22 [name] MD - Cardiology - Chronic diastolic heart failure - furosemide (LASIX) 40 MG tablet prescribed (increase). Follow up as scheduled.   11/21/21 [name], MD - Sports Medicine - Closed fracture of left patella - MRI is showing a fracture of the central portion of the tibial plateau.  She has noticed her pain improving. Counseled on home exercise therapy and supportive care.Advised to limit her weightbearing as the pain continues to improve. Could consider physical therapy.  11/14/21 [name], PA-C - Prolia injection administered. Follow up in 6 months   Hospital visits: 01/25/22 Medication Reconciliation was completed by comparing discharge summary, patient's EMR and Pharmacy list, and upon discussion with patient.  Admitted to the hospital on 01/25/22 due to Chest pain. Discharge date was 01/25/22. Discharged from [place]?Medications Started at [facility] Discharge:?? None noted.   Medication Changes at Hospital Discharge: None noted.  Medications Discontinued at Hospital Discharge: None noted.  Medications that remain the same after Hospital Discharge:??  All other medications will remain the same.    Medications: Outpatient Encounter Medications as of 02/06/2022  Medication Sig   acetaminophen (TYLENOL) 500 MG tablet Take 500 mg by mouth every 6 (six) hours as needed for moderate pain.    amLODipine (NORVASC) 5 MG tablet Take 1 tablet by mouth once daily   apixaban (ELIQUIS) 5 MG TABS tablet Take 1 tablet (5 mg total) by mouth 2 (two) times  daily.   b complex vitamins tablet Take 1 tablet by mouth daily.   Calcium Carbonate Antacid (TUMS CHEWY BITES PO) Take 1 tablet by mouth daily as needed (reflux).    Calcium Citrate-Vitamin D (CALCIUM + D PO) Take 1 tablet by mouth daily.   Carboxymethylcellul-Glycerin (LUBRICATING EYE DROPS OP) Place 1 drop into both eyes daily as needed (dry eyes).   clonazePAM (KLONOPIN) 0.5 MG tablet Take 1 tablet by mouth twice daily as needed for anxiety   cyclobenzaprine (FLEXERIL) 10 MG tablet Take 10 mg by mouth at bedtime as needed for muscle spasms.    denosumab (PROLIA) 60 MG/ML SOSY injection Inject 60 mg into the skin every 6 (six) months.   furosemide (LASIX) 40 MG tablet Take 1 tablet (40 mg total) by mouth daily.   levothyroxine (SYNTHROID) 75 MCG tablet Take 1 tablet by mouth once daily   meclizine (ANTIVERT) 25 MG tablet TAKE 1 TABLET BY MOUTH THREE TIMES DAILY AS NEEDED FOR DIZZINESS   metoprolol tartrate (LOPRESSOR) 50 MG tablet Take 1 tablet (50 mg total) by mouth 2 (two) times daily.   mirtazapine (REMERON) 7.5 MG tablet Take 1 tablet (7.5 mg total) by mouth at bedtime. (Patient not taking: Reported on 01/29/2022)   Nutritional Supplements (,FEEDING SUPPLEMENT, PROSOURCE PLUS) liquid Take 30 mLs by mouth 2 (two) times daily between meals.   nystatin (MYCOSTATIN/NYSTOP) powder Apply topically 2 (two) times daily as needed (skin irritation (under breasts)).   ondansetron (ZOFRAN) 4 MG tablet Take 1 tablet (4 mg total) by mouth every 8 (eight) hours as needed.   oxyCODONE (ROXICODONE) 5 MG/5ML  solution Take 4 mLs (4 mg total) by mouth every 4 (four) hours as needed for moderate pain.   polyethylene glycol (MIRALAX / GLYCOLAX) packet Take 17 g by mouth daily as needed for mild constipation.   promethazine (PHENERGAN) 25 MG tablet Take 1 tablet (25 mg total) by mouth 2 (two) times daily as needed for nausea or vomiting.   sodium chloride (OCEAN) 0.65 % SOLN nasal spray Place 1 spray into both  nostrils as needed for congestion (nose bleeds).   valACYclovir (VALTREX) 500 MG tablet Take 500 mg by mouth daily as needed (breakouts).    No facility-administered encounter medications on file as of 02/06/2022.    Current antihypertensive regimen:  amLODipine (NORVASC) 5 MG tablet metoprolol tartrate (LOPRESSOR) 50 MG tablet furosemide (LASIX) 40 MG tablet  How often are you checking your Blood Pressure?  Patient reported checking blood pressures several days a week. Not everyday  Current home BP readings: 110/60 earlier this week     What recent interventions/DTPs have been made by any provider to improve Blood Pressure control since last CPP Visit:  Patient reported increased Furosemide to 40 mg daily on 01/29/22 by Cardiology.   Any recent hospitalizations or ED visits since last visit with CPP? Patient had an ED visit on 01/25/22 for chest pain.   What diet changes have been made to improve Blood Pressure Control?  Patient reported increasing Furosemide and limits her salt intake.    What exercise is being done to improve your Blood Pressure Control?   Patient reported she tries to remain active when she is able is getting over a fracture in her knee.    Adherence Review: Is the patient currently on ACE/ARB medication? No Does the patient have >5 day gap between last estimated fill dates? No    Care Gaps   AWV: done 10/10/21 Colonoscopy: done 02/05/09 DM Eye Exam: N/A DM Foot Exam: N/A Microalbumin: N/A HbgAIC: done 04/16/20 (5.6) DEXA: done 04/01/21 Mammogram: done 12/30/21     Star Rating Drugs: No Star Rating Drugs Noted.    Future Appointments  Date Time Provider [place]  02/06/2022  1:00 PM [name], MD [facility]  02/11/2022  1:00 PM [facility]-ECHO/VAS [facility]  02/27/2022  1:00 PM [name], MD [facility]  05/02/2022  1:00 PM [name], MD [facility]  07/15/2022 11:40 AM [facility] DEXA RM 1 [facility] None  10/16/2022   8:45 AM [facility] HEALTH COACH [facility] [place], [facility] Clinical Pharmacist Assistant  [phone]

## 2022-02-06 NOTE — Patient Instructions (Signed)
Follow up as needed or as scheduled They'll call you to schedule your CT scan Try and take things 1 issue at a time.  Otherwise you will get overwhelmed and shut down Call with any questions or concerns Hang in there!!

## 2022-02-10 DIAGNOSIS — M5412 Radiculopathy, cervical region: Secondary | ICD-10-CM | POA: Diagnosis not present

## 2022-02-10 DIAGNOSIS — Z79891 Long term (current) use of opiate analgesic: Secondary | ICD-10-CM | POA: Diagnosis not present

## 2022-02-10 DIAGNOSIS — G894 Chronic pain syndrome: Secondary | ICD-10-CM | POA: Diagnosis not present

## 2022-02-10 DIAGNOSIS — M961 Postlaminectomy syndrome, not elsewhere classified: Secondary | ICD-10-CM | POA: Diagnosis not present

## 2022-02-10 DIAGNOSIS — M4693 Unspecified inflammatory spondylopathy, cervicothoracic region: Secondary | ICD-10-CM | POA: Diagnosis not present

## 2022-02-11 ENCOUNTER — Telehealth: Payer: Self-pay | Admitting: Family Medicine

## 2022-02-11 ENCOUNTER — Ambulatory Visit (INDEPENDENT_AMBULATORY_CARE_PROVIDER_SITE_OTHER): Payer: Medicare Other

## 2022-02-11 DIAGNOSIS — I10 Essential (primary) hypertension: Secondary | ICD-10-CM | POA: Diagnosis not present

## 2022-02-11 DIAGNOSIS — Z954 Presence of other heart-valve replacement: Secondary | ICD-10-CM

## 2022-02-11 DIAGNOSIS — Z8679 Personal history of other diseases of the circulatory system: Secondary | ICD-10-CM

## 2022-02-11 DIAGNOSIS — I341 Nonrheumatic mitral (valve) prolapse: Secondary | ICD-10-CM

## 2022-02-11 DIAGNOSIS — Z9889 Other specified postprocedural states: Secondary | ICD-10-CM

## 2022-02-11 DIAGNOSIS — I340 Nonrheumatic mitral (valve) insufficiency: Secondary | ICD-10-CM

## 2022-02-11 DIAGNOSIS — Z79899 Other long term (current) drug therapy: Secondary | ICD-10-CM | POA: Diagnosis not present

## 2022-02-11 NOTE — Telephone Encounter (Signed)
Mariea Clonts from imaging in [place] was calling from imaging to let you know he modified the order to CT Chest w/o Contrast for patient.

## 2022-02-12 LAB — ECHOCARDIOGRAM COMPLETE
AV Vena cont: 0.37 cm
Area-P 1/2: 3.37 cm2
P 1/2 time: 624 msec
S' Lateral: 2.12 cm

## 2022-02-13 ENCOUNTER — Ambulatory Visit (INDEPENDENT_AMBULATORY_CARE_PROVIDER_SITE_OTHER): Payer: Medicare Other

## 2022-02-13 DIAGNOSIS — R911 Solitary pulmonary nodule: Secondary | ICD-10-CM

## 2022-02-14 ENCOUNTER — Encounter: Payer: Self-pay | Admitting: Family Medicine

## 2022-02-14 ENCOUNTER — Telehealth: Payer: Self-pay | Admitting: Family Medicine

## 2022-02-14 ENCOUNTER — Telehealth: Payer: Self-pay

## 2022-02-14 DIAGNOSIS — Z79899 Other long term (current) drug therapy: Secondary | ICD-10-CM

## 2022-02-14 MED ORDER — POTASSIUM CHLORIDE ER 10 MEQ PO TBCR: EXTENDED_RELEASE_TABLET | ORAL | 0 refills | Status: DC

## 2022-02-14 NOTE — Telephone Encounter (Signed)
-----   Message from Regina Bergeron, MD sent at 02/13/2022  4:48 PM EDT ----- Her echo shows that her heart pumping function is normal at 50-55% (consistent with prior). 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 Her right ventricle has moderately reduced pumping function. This appears to be more significant than her prior echo. She also has evidence of elevated fluid levels on her current study which may be contributing to her shortness of breath. How is she feeling? If she is still short of breath, I would recommend increasing her lasix to '40mg'$  BID for 3 days and then daily thereafter. Can we give her potassium 22mq daily to take for the three days she is taking BID dosing of lasix? Will need repeat BMET and BNP in 1 week to ensure everything looks better.

## 2022-02-14 NOTE — Telephone Encounter (Signed)
Spoke with the patient and advised her to take potassium 2 tablets (20 meq) for three days. Patient verbalized understanding.

## 2022-02-14 NOTE — Telephone Encounter (Signed)
Patient is following up, requesting to speak with Judson Roch, RN if possible. She is at the pharmacy and would like to clarify instructions for Potassium ASAP.

## 2022-02-14 NOTE — Telephone Encounter (Signed)
Spoke with patient who states she's still short of breath and wants to try the increased lasix for 3 days. States she will use what she has on hand and understands to take it twice daily for 3 days only. Potassium sent to pharmacy on file-she understands how to use, was concerned it may cause diarrhea. Assured her that this is not a high dose and it's only administered for 3 days, so shouldn't be an issue with that. Repeat labs placed and scheduled for 02/21/22.

## 2022-02-14 NOTE — Telephone Encounter (Signed)
Error; pt wanted to write MyChart message herself instead.

## 2022-02-14 NOTE — Progress Notes (Signed)
Pt seen results Via my chart  

## 2022-02-17 ENCOUNTER — Other Ambulatory Visit (HOSPITAL_COMMUNITY): Payer: Medicare Other

## 2022-02-18 ENCOUNTER — Telehealth: Payer: Self-pay | Admitting: Family Medicine

## 2022-02-18 ENCOUNTER — Telehealth: Payer: Self-pay

## 2022-02-18 DIAGNOSIS — R918 Other nonspecific abnormal finding of lung field: Secondary | ICD-10-CM

## 2022-02-18 NOTE — Telephone Encounter (Signed)
The referral was entered as 'Urgent' today so hopefully that will speed this along.

## 2022-02-18 NOTE — Telephone Encounter (Signed)
Caller name: Alie   On DPR? :yes/no: Yes  Call back number: [phone]  Provider they see: [name]  Reason for call: Pt called, states that pain in chest and anxiety about up pulmonary appoint, pt is very stressed about waiting to hear from them about scheduling an appointment. Wants to know if there is anyway to expedite getting scheduled.

## 2022-02-18 NOTE — Telephone Encounter (Signed)
I spoke w/ pt and advised pt that she can call the [facility] [facility] office to schedule her apt

## 2022-02-19 DIAGNOSIS — Z79891 Long term (current) use of opiate analgesic: Secondary | ICD-10-CM | POA: Diagnosis not present

## 2022-02-19 DIAGNOSIS — M5412 Radiculopathy, cervical region: Secondary | ICD-10-CM | POA: Diagnosis not present

## 2022-02-19 DIAGNOSIS — R200 Anesthesia of skin: Secondary | ICD-10-CM | POA: Diagnosis not present

## 2022-02-19 DIAGNOSIS — G894 Chronic pain syndrome: Secondary | ICD-10-CM | POA: Diagnosis not present

## 2022-02-19 DIAGNOSIS — R2689 Other abnormalities of gait and mobility: Secondary | ICD-10-CM | POA: Diagnosis not present

## 2022-02-19 DIAGNOSIS — M5459 Other low back pain: Secondary | ICD-10-CM | POA: Diagnosis not present

## 2022-02-19 DIAGNOSIS — M546 Pain in thoracic spine: Secondary | ICD-10-CM | POA: Diagnosis not present

## 2022-02-19 DIAGNOSIS — Z981 Arthrodesis status: Secondary | ICD-10-CM | POA: Diagnosis not present

## 2022-02-21 ENCOUNTER — Other Ambulatory Visit: Payer: Medicare Other

## 2022-02-21 DIAGNOSIS — Z79899 Other long term (current) drug therapy: Secondary | ICD-10-CM | POA: Diagnosis not present

## 2022-02-22 LAB — BASIC METABOLIC PANEL
BUN/Creatinine Ratio: 29 — ABNORMAL HIGH (ref 12–28)
BUN: 27 mg/dL (ref 8–27)
CO2: 28 mmol/L (ref 20–29)
Calcium: 9.6 mg/dL (ref 8.7–10.3)
Chloride: 98 mmol/L (ref 96–106)
Creatinine, Ser: 0.93 mg/dL (ref 0.57–1.00)
Glucose: 98 mg/dL (ref 70–99)
Potassium: 4.1 mmol/L (ref 3.5–5.2)
Sodium: 142 mmol/L (ref 134–144)
eGFR: 63 mL/min/{1.73_m2} (ref >59)

## 2022-02-22 LAB — PRO B NATRIURETIC PEPTIDE: NT-Pro BNP: 1100 pg/mL — ABNORMAL HIGH (ref 0–738)

## 2022-02-24 ENCOUNTER — Other Ambulatory Visit: Payer: Medicare Other

## 2022-02-24 ENCOUNTER — Telehealth: Payer: Self-pay | Admitting: *Deleted

## 2022-02-24 DIAGNOSIS — R7989 Other specified abnormal findings of blood chemistry: Secondary | ICD-10-CM

## 2022-02-24 DIAGNOSIS — I5032 Chronic diastolic (congestive) heart failure: Secondary | ICD-10-CM

## 2022-02-24 DIAGNOSIS — Z79899 Other long term (current) drug therapy: Secondary | ICD-10-CM

## 2022-02-24 MED ORDER — FUROSEMIDE 40 MG PO TABS: ORAL_TABLET | ORAL | 0 refills | Status: DC

## 2022-02-24 MED ORDER — POTASSIUM CHLORIDE ER 10 MEQ PO TBCR: EXTENDED_RELEASE_TABLET | ORAL | 0 refills | Status: DC

## 2022-02-24 NOTE — Telephone Encounter (Signed)
-----   Message from Werner Lean, MD sent at 02/22/2022  3:13 PM EDT ----- Covering for Dr. Mamie Nick. Sounds like this woman may have needed more than just three days of her doubled up lasix. She may need the increase potassium and lasix 40 mg PO BID.   BMP and BNP in a week or two on the increased dose and may need to be seen if no improvement in her SOB  Thanks, MAC  ----- Message ----- From: Interface, [facility] Lab Results In Sent: 02/22/2022   3:41 AM EDT To: Freada Bergeron, MD

## 2022-02-24 NOTE — Telephone Encounter (Signed)
The patient has been notified of the result and verbalized understanding.  All questions (if any) were answered.  Pt aware that we will continue to increase her lasix to 40 mg po BID x 1 week as well as continue with increased KDUR of 20 mEq po daily x 1 week.  Pt prefers the 10 mEq tablets of KDUR be called in with instructions for her to take KDUR 10 mEq-take 2 tablets (20 mEq total) by mouth daily for one week.   Confirmed the pharmacy of choice with the pt.  Scheduled her for repeat BMET and PRO-BNP in one week on 03/03/22. Pt verbalized understanding and agrees with this plan.

## 2022-02-27 ENCOUNTER — Ambulatory Visit (INDEPENDENT_AMBULATORY_CARE_PROVIDER_SITE_OTHER): Payer: Medicare Other | Admitting: Family Medicine

## 2022-02-27 ENCOUNTER — Encounter: Payer: Self-pay | Admitting: Family Medicine

## 2022-02-27 VITALS — BP 98/60 | HR 72 | Temp 98.0°F | Resp 18 | Ht 66.0 in | Wt 113.1 lb

## 2022-02-27 DIAGNOSIS — F32A Depression, unspecified: Secondary | ICD-10-CM | POA: Diagnosis not present

## 2022-02-27 DIAGNOSIS — E43 Unspecified severe protein-calorie malnutrition: Secondary | ICD-10-CM

## 2022-02-27 DIAGNOSIS — F419 Anxiety disorder, unspecified: Secondary | ICD-10-CM

## 2022-02-27 NOTE — Patient Instructions (Signed)
We will call you with your psychiatry appt to work on depression and anxiety You MUST eat.  It doesn't matter if you are hungry at the time, you have to meet your calorie needs I want you eating at least 3 meals/day.  If not eating 3 meals, you need to be eating at least 5 large snacks throughout the day You need to be drinking at least 2 protein shakes daily Call with any questions or concerns Hang in there!!

## 2022-02-27 NOTE — Progress Notes (Signed)
   Subjective:    Patient ID: [name], female    DOB: 14-Oct-1945, 76 y.o.   MRN: [id number]  HPI 'i got a lot of problems'- pt has ongoing cardiac issues, currently adjusting diuretics based on BNP.  Is waiting on pulmonary appt for an abnormal CT scan.  Her appt is scheduled for next Friday- 2.5 weeks after referral made- and she is tearful that this is not soon enough.  Pt reports she is not eating- 'i don't have an appetite'.  'i'm at the point I just want to give up.  Why even try any more?  I've been doing this for 8 yrs'.  Pt's counselor ended up leaving the practice for personal reasons- has not been in counseling for at least 2 months.  Stopped Mirtazapine due to excessive sedation.  Pt is not interested in palliative care at this time.   Review of Systems For ROS see HPI     Objective:   Physical Exam Vitals reviewed.  Constitutional:      General: She is not in acute distress.    Appearance: She is ill-appearing.     Comments: Underweight, very frail  HENT:     Head: Normocephalic and atraumatic.  Skin:    General: Skin is warm and dry.  Neurological:     Mental Status: She is alert and oriented to person, place, and time.  Psychiatric:     Comments: Tearful, anxious           Assessment & Plan:

## 2022-02-28 NOTE — Assessment & Plan Note (Signed)
Deteriorated.  Pt is losing weight.  States that for an entire day she may eat half a sandwich or an egg.  Told her that this is not nearly enough and that she won't be able to sustain this.  Discussed that right now it doesn't matter if she wants to eat, she has to eat.  It's a matter of survival.  Discussed either 3 meals/day or 5 large snacks throughout the day.  Encouraged at least 2 protein shakes daily.  Pt states she has them at home, she just doesn't drink them.  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  Will follow closely.

## 2022-02-28 NOTE — Assessment & Plan Note (Signed)
Deteriorated.  Pt continues to have high anxiety regarding her multiple health issues.  She again stopped/refused the medication prescribed.  This time, Mirtazapine- which was to help w/ both mood and appetite.  She states she stopped due to excessive sedation.  She has been 'intolerant' to every SSRI, SNRI, tricyclic, tetracyclic, etc.  With most of these medications she never took more than 1-2 doses before deciding she could not take the medication.  That was despite the instructions that she needed to give each at least a week to see what side effects would be transient.  She is no longer in counseling.  She states provider left.  At this time, it is a vicious cycle of physical and psychological issues that keep feeding off each other.  She states she is ready to 'give up' but denies being suicidal.  Not interested in palliative care.  Will refer to psychiatry for complete evaluation and assistance in this matter- although pt is very reluctant.

## 2022-03-03 ENCOUNTER — Ambulatory Visit: Payer: Medicare Other | Attending: Cardiology

## 2022-03-03 ENCOUNTER — Telehealth (HOSPITAL_BASED_OUTPATIENT_CLINIC_OR_DEPARTMENT_OTHER): Payer: Self-pay | Admitting: Cardiology

## 2022-03-03 DIAGNOSIS — R7989 Other specified abnormal findings of blood chemistry: Secondary | ICD-10-CM | POA: Diagnosis not present

## 2022-03-03 DIAGNOSIS — I5032 Chronic diastolic (congestive) heart failure: Secondary | ICD-10-CM | POA: Diagnosis not present

## 2022-03-03 DIAGNOSIS — Z79899 Other long term (current) drug therapy: Secondary | ICD-10-CM | POA: Diagnosis not present

## 2022-03-03 NOTE — Telephone Encounter (Signed)
Pt c/o medication issue:  1. Name of Medication:  furosemide (LASIX) 40 MG tablet  2. How are you currently taking this medication (dosage and times per day)?  As prescribed  3. Are you having a reaction (difficulty breathing--STAT)?   4. What is your medication issue?   Patient called stating she started the medication on Aug 22 am.  She received the medication on Aug 21 (to be taken for 7 days).  Patient stated she has an appointment to have blood drawn today but will be 2 pills short of the 7 days.  Patient wants to know if she should finish the medication and then have the blood draw or come in for the blood draw less the 2 tablets.

## 2022-03-03 NOTE — Telephone Encounter (Signed)
Pt aware that she can come either today for her lab work or tomorrow, whichever she prefer.  Pt states she will just keep her lab appt as scheduled for today, with our office.  Pt verbalized understanding and agrees with this plan.  Pt was more than gracious for all the assistance provided.

## 2022-03-03 NOTE — Telephone Encounter (Signed)
[place] pt.

## 2022-03-04 ENCOUNTER — Telehealth: Payer: Self-pay | Admitting: Internal Medicine

## 2022-03-04 ENCOUNTER — Telehealth: Payer: Self-pay

## 2022-03-04 DIAGNOSIS — I5032 Chronic diastolic (congestive) heart failure: Secondary | ICD-10-CM

## 2022-03-04 DIAGNOSIS — Z79899 Other long term (current) drug therapy: Secondary | ICD-10-CM

## 2022-03-04 LAB — PRO B NATRIURETIC PEPTIDE: NT-Pro BNP: 868 pg/mL — ABNORMAL HIGH (ref 0–738)

## 2022-03-04 LAB — BASIC METABOLIC PANEL
BUN/Creatinine Ratio: 23 (ref 12–28)
BUN: 26 mg/dL (ref 8–27)
CO2: 33 mmol/L — ABNORMAL HIGH (ref 20–29)
Calcium: 9.7 mg/dL (ref 8.7–10.3)
Chloride: 93 mmol/L — ABNORMAL LOW (ref 96–106)
Creatinine, Ser: 1.11 mg/dL — ABNORMAL HIGH (ref 0.57–1.00)
Glucose: 87 mg/dL (ref 70–99)
Potassium: 5.2 mmol/L (ref 3.5–5.2)
Sodium: 147 mmol/L — ABNORMAL HIGH (ref 134–144)
eGFR: 51 mL/min/{1.73_m2} — ABNORMAL LOW (ref >59)

## 2022-03-04 MED ORDER — FUROSEMIDE 20 MG PO TABS: ORAL_TABLET | ORAL | 3 refills | Status: DC

## 2022-03-04 MED ORDER — FUROSEMIDE 40 MG PO TABS: ORAL_TABLET | ORAL | 3 refills | Status: DC

## 2022-03-04 NOTE — Telephone Encounter (Signed)
-----   Message from Freada Bergeron, MD sent at 03/04/2022 12:43 PM EDT ----- Regarding: RE: BMET / Pro B Nat Peptide results Fabio Bering,  Thank you so much for letting me know.  It looks like we dried her out. How is she taking her lasix currently? Is she back down to '40mg'$  daily.  If so, let's back down to lasix '40mg'$  M, W, F, Sun and '20mg'$  T, Th, Sat. I just don't want to dry her out too much. Will repeat BMET in 14 days for monitoring.  She is a complex cookie. Let me know if you have any issues. Sherri Price also knows her well if you need help since Karlene Einstein is out today.  Thank you! -Nira Conn ----- Message ----- From: Varney Daily, RN Sent: 03/04/2022  11:13 AM EDT To: Freada Bergeron, MD; Precious Gilding, RN Subject: BMET / Pro B Nat Peptide results               Pt lab results; Pro B Nat Pep improving, BMET creatinine is slightly elevated... just wanted to [facility] you, and do you have any new orders.    Merrilee Seashore RN

## 2022-03-04 NOTE — Telephone Encounter (Signed)
That's fine ok to switch.

## 2022-03-04 NOTE — Telephone Encounter (Signed)
Called pt advised of MD recommendation.  Pt wrote down instructions.  Orders placed and sent to pharmacy.  Pt will have repeat labs on 03/18/22.

## 2022-03-04 NOTE — Telephone Encounter (Signed)
Patient was scheduled as a consult on Dr. August Albino schedule tomorrow at [place] but she has seen Dr. Shearon Stalls one time 12/2020. I called patient to move to Dr. Shearon Stalls, but patient is requesting to switch to Dr. Erin Fulling and leave the appointment tomorrow at 4pm.   Please advise if switch is okay.

## 2022-03-04 NOTE — Telephone Encounter (Signed)
-----   Message from Freada Bergeron, MD sent at 03/04/2022 12:46 PM EDT ----- Sent message to [place] about labs. Looks like we dried her out a bit. If she is taking lasix '40mg'$  daily; will back down to '40mg'$  every M, W, F, Sun and '20mg'$  T, Thu, Sat. Repeat BMET in 14 days.

## 2022-03-05 ENCOUNTER — Ambulatory Visit (INDEPENDENT_AMBULATORY_CARE_PROVIDER_SITE_OTHER): Payer: Medicare Other | Admitting: Pulmonary Disease

## 2022-03-05 ENCOUNTER — Encounter: Payer: Self-pay | Admitting: Pulmonary Disease

## 2022-03-05 ENCOUNTER — Encounter: Payer: Self-pay | Admitting: Family Medicine

## 2022-03-05 ENCOUNTER — Telehealth (INDEPENDENT_AMBULATORY_CARE_PROVIDER_SITE_OTHER): Payer: Medicare Other | Admitting: Family Medicine

## 2022-03-05 VITALS — BP 112/68 | HR 79 | Ht 66.0 in | Wt 116.2 lb

## 2022-03-05 DIAGNOSIS — R079 Chest pain, unspecified: Secondary | ICD-10-CM | POA: Diagnosis not present

## 2022-03-05 DIAGNOSIS — I50812 Chronic right heart failure: Secondary | ICD-10-CM

## 2022-03-05 DIAGNOSIS — E43 Unspecified severe protein-calorie malnutrition: Secondary | ICD-10-CM

## 2022-03-05 DIAGNOSIS — R0602 Shortness of breath: Secondary | ICD-10-CM

## 2022-03-05 DIAGNOSIS — K219 Gastro-esophageal reflux disease without esophagitis: Secondary | ICD-10-CM

## 2022-03-05 DIAGNOSIS — R1310 Dysphagia, unspecified: Secondary | ICD-10-CM

## 2022-03-05 MED ORDER — STIOLTO RESPIMAT 2.5-2.5 MCG/ACT IN AERS: 2.0000 | INHALATION_SPRAY | Freq: Every day | RESPIRATORY_TRACT | 0 refills | Status: DC

## 2022-03-05 NOTE — Progress Notes (Signed)
Virtual Visit via Video   I connected with patient on 03/05/22 at 10:20 AM EDT by a video enabled telemedicine application and verified that I am speaking with the correct person using two identifiers.  Location patient: Home Location provider: [facility], Office Persons participating in the virtual visit: Patient, Provider, [place] Marcille Blanco C)  I discussed the limitations of evaluation and management by telemedicine and the availability of in person appointments. The patient expressed understanding and agreed to proceed.  Subjective:   HPI:   'i've got a list of things to talk about'- 'my health has just been spiraling the last few years'.  I had mentioned palliative care at last visit (8/24) and pt is interested to know what that would entail.    ROS:   See pertinent positives and negatives per HPI.  Patient Active Problem List   Diagnosis Date Noted   Nodule of right lung 02/06/2022   Closed fracture of left patella with routine healing 11/21/2021   Closed fracture of lateral portion of left tibial plateau 11/07/2021   Tachycardia 10/17/2021   HTN (hypertension) 06/20/2021   Visit for wound check 04/29/2021   Therapeutic opioid-induced constipation (OIC) 04/03/2021   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   Atrial fibrillation ([place]) 02/09/2020   Long term (current) use of anticoagulants 02/09/2020   Acute blood loss anemia 02/09/2020   Debility 02/02/2020   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/2020   Severe mitral regurgitation    Mitral regurgitation due to cusp prolapse    Ankle swelling, left 10/20/2019   Ankle swelling, right 10/20/2019   Secondary hypercoagulable state ([place]) 08/19/2019   Left inguinal hernia 03/11/2019   Visit for monitoring Tikosyn therapy 03/29/2018   Osteoporosis 09/23/2017   Fatigue 02/18/2017   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   Senile osteoporosis 08/01/2016   Physical exam 04/21/2016    Positional lightheadedness 11/16/2015   B12 deficiency 10/01/2015   Hearing loss due to cerumen impaction 08/23/2015   Foot pain, right 05/23/2015   Severe protein-calorie malnutrition ([place]) 10/19/2014   Fracture of multiple ribs 08/07/2014   Shortness of breath 08/06/2014   Constipation 07/14/2014   Peripheral neuropathy 07/11/2014   Vitamin B12 deficiency anemia, unspecified 06/19/2014   Hypokalemia 06/19/2014   Other chronic pain 06/19/2014   Polyneuropathy, unspecified 06/19/2014   Zoster without complications 98/92/1194   Vitreous degeneration, bilateral 06/19/2014   Nutritional deficiency, unspecified 06/19/2014   Nocturia 17/40/8144   Other symbolic dysfunctions 81/85/6314   Unsteadiness on feet 06/19/2014   Unspecified osteoarthritis, unspecified site 06/19/2014   Muscle weakness (generalized) 06/19/2014   Major depressive disorder, single episode, unspecified 06/19/2014   Frequency of micturition 06/19/2014   Encounter for orthopedic aftercare following scoliosis surgery 06/19/2014   Cardiac arrhythmia, unspecified 06/19/2014   Age-related nuclear cataract, unspecified eye 06/19/2014   Hyperlipidemia 06/08/2013   Lumbar back pain 05/19/2012   Ureteric stone 02/18/2012   Leg pain, bilateral 11/17/2011   Hereditary and idiopathic peripheral neuropathy 11/06/2011   S/P cervical spinal fusion 11/06/2011   Gastro-esophageal reflux disease without esophagitis 11/06/2011   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 11/06/2011   Personal history of other diseases of the circulatory system 11/06/2011   Melanoma of skin ([place]) 11/06/2011   History of cardiovascular disorder 11/06/2011   Chronic interstitial cystitis 04/09/2011   Allergic rhinitis 04/25/2010   Vulvodynia, 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   Anxiety and depression 03/04/2010   Malaise and fatigue 02/05/2010   Vitamin D deficiency 01/28/2010  Neoplasm of uncertain behavior of skin 01/25/2010   Disorder of bone and  cartilage 01/25/2010   Genital herpes simplex 11/01/2009   Herpes simplex virus (HSV) infection 11/01/2009   Herpetic vulvovaginitis 10/23/2009   Kidney stone 08/09/2009   Urinary tract infection, site not specified 08/09/2009   Brachial neuritis 07/23/2009   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 , Disp: , Rfl:    apixaban (ELIQUIS) 5 MG TABS tablet, Take 1 tablet (5 mg total) by mouth 2 (two) times daily., Disp: 180 tablet, Rfl: 3   b complex vitamins tablet, Take 1 tablet by mouth daily., Disp: , Rfl:    Calcium Carbonate Antacid (TUMS CHEWY BITES PO), Take 1 tablet by mouth daily as needed (reflux). , Disp: , Rfl:    Calcium Citrate-Vitamin D (CALCIUM + D PO), Take 1 tablet by mouth daily., Disp: , Rfl:    Carboxymethylcellul-Glycerin (LUBRICATING EYE DROPS OP), Place 1 drop into both eyes daily as needed (dry eyes)., Disp: , Rfl:    clonazePAM (KLONOPIN) 0.5 MG tablet, Take 1 tablet by mouth twice daily as needed for anxiety, Disp: 30 tablet, Rfl: 3   cyclobenzaprine (FLEXERIL) 10 MG tablet, Take 10 mg by mouth at bedtime as needed for muscle spasms. , Disp: , Rfl: 5   denosumab (PROLIA) 60 MG/ML SOSY injection, Inject 60 mg into the skin every 6 (six) months., Disp: , Rfl:    furosemide (LASIX) 20 MG tablet, Take on Tue-Thur-Sat, Disp: 36 tablet, Rfl: 3   furosemide (LASIX) 40 MG tablet, Take on M-W-F- Sun, Disp: 48 tablet, Rfl: 3   levothyroxine (SYNTHROID) 75 MCG  tablet, Take 1 tablet by mouth once daily, Disp: 90 tablet, Rfl: 0   meclizine (ANTIVERT) 25 MG tablet, TAKE 1 TABLET BY MOUTH THREE TIMES DAILY AS NEEDED FOR DIZZINESS, Disp: 30 tablet, Rfl: 0   metoprolol tartrate (LOPRESSOR) 50 MG tablet, Take 1 tablet (50 mg total) by mouth 2 (two) times daily., Disp: 180 tablet, Rfl: 3   Nutritional Supplements (,FEEDING SUPPLEMENT, PROSOURCE PLUS) liquid, Take 30 mLs by mouth 2 (two) times daily between meals., Disp: 887 mL, Rfl: 0   nystatin (MYCOSTATIN/NYSTOP) powder, Apply topically 2 (two) times daily as needed (skin irritation (under breasts))., Disp: 15 g, Rfl: 0   ondansetron (ZOFRAN) 4 MG tablet, Take 1 tablet (4 mg total) by mouth every 8 (eight) hours as needed., Disp: 30 tablet, Rfl: 0   oxyCODONE (ROXICODONE) 5 MG/5ML solution, Take 4 mLs (4 mg total) by mouth every 4 (four) hours as needed for moderate pain., Disp: , Rfl: 0   polyethylene glycol (MIRALAX / GLYCOLAX) packet, Take 17 g by mouth daily as needed for mild constipation., Disp: , Rfl:    potassium chloride (KLOR-CON) 10 MEQ tablet, Take 2 tablets by mouth daily for one week only., Disp: 14 tablet, Rfl: 0   promethazine (  PHENERGAN) 25 MG tablet, Take 1 tablet (25 mg total) by mouth 2 (two) times daily as needed for nausea or vomiting., Disp: 20 tablet, Rfl: 0   sodium chloride (OCEAN) 0.65 % SOLN nasal spray, Place 1 spray into both nostrils as needed for congestion (nose bleeds)., Disp: , Rfl:    valACYclovir (VALTREX) 500 MG tablet, Take 500 mg by mouth daily as needed (breakouts). , Disp: , Rfl:   Allergies  Allergen Reactions   Buprenorphine Nausea Only and Other (See Comments)    sedation and adhesive reaction Other reaction(s): nausea, sedation and adhesive reaction Other reaction(s): nausea, sedation and adhesive reaction, Other (See Comments), Other (See Comments) sedation and adhesive reaction Other reaction(s): nausea, sedation and adhesive reaction sedation and adhesive  reaction Other reaction(s): nausea, sedation and adhesive reaction sedation and adhesive reaction Other reaction(s): nausea, sedation and adhesive reaction sedation and adhesive reaction Other reaction(s): nausea, sedation and adhesive reaction sedation and adhesive reaction Other reaction(s): nausea, sedation and adhesive reaction sedation and adhesive reaction Other reaction(s): nausea, sedation and adhesive reaction   Erythromycin Nausea And Vomiting and Other (See Comments)    Dizziness  Other reaction(s): Other (See Comments) Dizziness  unknown unknown Dizziness  BURNING ALL OVER  BURNING ALL OVER  unknown Dizziness  BURNING ALL OVER    Iodinated Contrast Media Hives, Nausea Only and Rash    unknown unknown unknown   Latex Palpitations, Other (See Comments) and Rash     hives and sores Other reaction(s): hives and sores Other reaction(s):  hives and sores, Other (See Comments), Other (See Comments)  hives and sores Other reaction(s): hives and sores hives hives  hives and sores Other reaction(s): hives and sores hives Sores  hives and sores Other reaction(s): hives and sores hives Sores  hives and sores Other reaction(s): hives and sores hives  hives and sores Other reaction(s): hives and sores hives Sores  hives and sores Other reaction(s): hives and sores   Other Other (See Comments)    CAN NOT TAKE , aLPRAZOLAM, OR ELAVIL DUE TO AFIB FOR ANXIETY  IV CONTRAST /"DYE". Other reaction(s): hives and sores Other reaction(s): Other (See Comments), Other (See Comments) CAN NOT TAKE , aLPRAZOLAM, OR ELAVIL DUE TO AFIB FOR ANXIETY  IV CONTRAST /"DYE". Other reaction(s): hives and sores IV CONTRAST /"DYE". CAN NOT TAKE , aLPRAZOLAM, OR ELAVIL DUE TO AFIB FOR ANXIETY  IV CONTRAST /"DYE". Other reaction(s): hives and sores Patient states that every time she has anesthesia, she wakes up vomiting, has chills, blood pressure drops CAN NOT TAKE , aLPRAZOLAM,  OR ELAVIL DUE TO AFIB FOR ANXIETY  IV CONTRAST /"DYE". Other reaction(s): hives and sores IV CONTRAST /"DYE". Patient states that every time she has anesthesia, she wakes up vomiting, has chills, blood pressure drops CAN NOT TAKE , aLPRAZOLAM, OR ELAVIL DUE TO AFIB FOR ANXIETY  IV CONTRAST /"DYE". Other reaction(s): hives and sores IV CONTRAST /"DYE". CAN NOT TAKE , aLPRAZOLAM, OR ELAVIL DUE TO AFIB FOR ANXIETY  IV CONTRAST /"DYE". Other reaction(s): hives and sores Patient states that every time she has anesthesia, she wakes up vomiting, has chills, blood pressure drops CAN NOT TAKE , aLPRAZOLAM, OR ELAVIL DUE TO AFIB FOR ANXIETY  IV CONTRAST /"DYE". Other reaction(s): hives and sores   Tape Rash, Other (See Comments) and Itching    Heart monitor stickers must be rotated in order to prevent rash Other reaction(s): hives and sores Adhesive on EKG tabs=BURNS SKIN**PAPER TAPE OK** sores Adhesive on EKG tabs.  Per Pt, Ok w/Paper Tape Adhesive on EKG tabs=BURNS SKIN**PAPER TAPE OK** sores Other reaction(s): Other (See Comments) Heart monitor stickers must be rotated in order to prevent rash Adhesive on EKG tabs=BURNS SKIN**PAPER TAPE OK** sores   Ciprofloxacin Other (See Comments)    Other reaction(s): Other (See Comments) Burning all over Burning all over Burning all over   Duloxetine Hcl     Severe diarrhea and upset stomach Other reaction(s): Other (See Comments), Unknown Severe diarrhea and upset stomach Severe diarrhea and upset stomach Severe diarrhea and upset stomach   Erythromycin Base     Other reaction(s): Other (See Comments), Unknown   Hydrocodone Other (See Comments)    Sedation,dizziness, and nausea Other reaction(s): sedation and nausea Other reaction(s): sedation and nausea Other reaction(s): Other (See Comments), Other (See Comments), sedation and nausea Sedation,dizziness, and nausea Other reaction(s): sedation and nausea Other reaction(s): sedation  and nausea Sedation,dizziness, and nausea Other reaction(s): sedation and nausea Other reaction(s): sedation and nausea Sedation,dizziness, and nausea Other reaction(s): sedation and nausea Other reaction(s): sedation and nausea Sedation,dizziness, and nausea Other reaction(s): sedation and nausea Other reaction(s): sedation and nausea Sedation,dizziness, and nausea Other reaction(s): sedation and nausea Other reaction(s): sedation and nausea Sedation,dizziness, and nausea Other reaction(s): sedation and nausea Other reaction(s): sedation and nausea   Hydromorphone Other (See Comments)    Sedation,dizziness, and nausea Other reaction(s): sedation and nausea Other reaction(s): sedation and nausea Other reaction(s): Other (See Comments), Other (See Comments), sedation and nausea Sedation,dizziness, and nausea Other reaction(s): sedation and nausea Other reaction(s): sedation and nausea Sedation,dizziness, and nausea Other reaction(s): sedation and nausea Other reaction(s): sedation and nausea Sedation,dizziness, and nausea Other reaction(s): sedation and nausea Other reaction(s): sedation and nausea Sedation,dizziness, and nausea Other reaction(s): sedation and nausea Other reaction(s): sedation and nausea Sedation,dizziness, and nausea Other reaction(s): sedation and nausea Other reaction(s): sedation and nausea Sedation,dizziness, and nausea Other reaction(s): sedation and nausea Other reaction(s): sedation and nausea   Iodine     unknown Other reaction(s): Other (See Comments) unknown unknown unknown unknown unknown unknown   Levofloxacin Nausea And Vomiting and Nausea Only   Metrizamide Hives   Nsaids     CANNOT TAKE PER CARDIOLOGIST DUE TO AFIB    Nucynta [Tapentadol Hcl]     nausea and sedation   Oxycodone Other (See Comments)    Delusions (intolerance)  PILLS ONLY sedation, dizziness, nausea,  and itching Other reaction(s): sedation and nausea Other  reaction(s): sedation and nausea Other reaction(s): Hallucinations, Other Other reaction(s): Other (See Comments), sedation and nausea Delusions (intolerance)  PILLS ONLY sedation, dizziness, nausea,  and itching Other reaction(s): sedation and nausea Other reaction(s): sedation and nausea Delusions (intolerance)  PILLS ONLY sedation, dizziness, nausea,  and itching Other reaction(s): sedation and nausea Other reaction(s): sedation and nausea Delusions (intolerance)  PILLS ONLY sedation, dizziness, nausea,  and itching Other reaction(s): sedation and nausea Other reaction(s): sedation and nausea Delusions (intolerance)  PILLS ONLY sedation, dizziness, nausea,  and itching Other reaction(s): sedation and nausea Other reaction(s): sedation and nausea Delusions (intolerance)  PILLS ONLY sedation, dizziness, nausea,  and itching Other reaction(s): sedation and nausea Other reaction(s): sedation and nausea Delusions (intolerance)  PILLS ONLY sedation, dizziness, nausea,  and itching Other reaction(s): sedation and nausea Other reaction(s): sedation and nausea   Pentazocine Other (See Comments)    Unknown Other reaction(s): Unknown Other reaction(s): Unknown Other reaction(s): Other (See Comments), Other (See Comments), Unknown Unknown Other reaction(s): Unknown Other reaction(s): Unknown Unknown Other reaction(s): Unknown Other reaction(s): Unknown Unknown Other  reaction(s): Unknown Other reaction(s): Unknown Unknown Other reaction(s): Unknown Other reaction(s): Unknown Unknown Other reaction(s): Unknown Other reaction(s): Unknown Unknown Other reaction(s): Unknown Other reaction(s): Unknown   Septra [Bactrim]     Hives    Sulfasalazine Hives   Morphine Anxiety, Other (See Comments) and Nausea Only    sedation and nausea Other reaction(s): sedation and nausea Other reaction(s): sedation and nausea Other reaction(s): Delusions (intolerance), Other (See Comments), Other  (See Comments), sedation and nausea sedation and nausea Other reaction(s): sedation and nausea Other reaction(s): sedation and nausea sedation and nausea Other reaction(s): sedation and nausea Other reaction(s): sedation and nausea PT CANNOT WAKE UP AFTER TAKING MEDICATION AND HAS NIGHTMARES  sedation and nausea Other reaction(s): sedation and nausea Other reaction(s): sedation and nausea PT CANNOT WAKE UP AFTER TAKING MEDICATION AND HAS NIGHTMARES  sedation and nausea Other reaction(s): sedation and nausea Other reaction(s): sedation and nausea sedation and nausea Other reaction(s): sedation and nausea Other reaction(s): sedation and nausea PT CANNOT WAKE UP AFTER TAKING MEDICATION AND HAS NIGHTMARES  sedation and nausea Other reaction(s): sedation and nausea Other reaction(s): sedation and nausea   Sulfa Antibiotics Hives, Nausea Only and Rash    rash rash rash   Sulfamethoxazole-Trimethoprim Rash    Hives  Hives  Hives  Hives  Hives  Hives    Tapentadol Nausea Only    Other reaction(s): nausea and sedation, Other (See Comments) nausea and sedation nausea and sedation    Objective:   There were no vitals taken for this visit. AAOx3, NAD NCAT, EOMI No obvious CN deficits Coloring WNL Pt is able to speak clearly, coherently without shortness of breath or increased work of breathing.  Thought process is linear.  Mood is appropriate.   Assessment and Plan:   R heart failure- ongoing issue.  Discussed w/ pt that her current dx qualifies for palliative care services.  She would most benefit from the emotional and spiritual support they can provide and also symptom management.  Once she better understood the services provided, she was interested in the referral.  Referral placed.   [name], MD 03/05/2022

## 2022-03-05 NOTE — Patient Instructions (Addendum)
Try stiolto 2 puffs twice daily  We will refer you to our speech therapy team for evaluation.  I am concerned your chest pain could be coming from the esophagus or possibly reflux disease.  I will check with Dr. Johney Frame about starting pantoprazole for your reflux disease.   We will follow up a CT chest scan in the future. Your CT chest scan shows bronchiectasis. Your CT chest scan is not concerning for cancer at this time.  Follow up in 3 months

## 2022-03-05 NOTE — Telephone Encounter (Signed)
Called and spoke with patient, advised that Dr. Shearon Stalls was fine with the change in providers and for her to see Dr. Erin Fulling today at 4 pm, let her know to arrive by 3:45 pm.  She wanted [place] to know that she had been in the hospital recently at [facility] with her heart and they found a nodule.  She was told that her PCP would not order a CT scan until she was seen.  The appointment was scheduled for her with [place] and she did not remember who she saw last year for the breathing test.  She just wanted [place] to know that she did not schedule the appointment and did not want there to be and harsh feelings.  Advised that we understand and would see her this afternoon.  Nothing further needed.

## 2022-03-05 NOTE — Progress Notes (Signed)
Synopsis: Referred in August 2023 for abnormal CT Chest scan  Subjective:   PATIENT ID: [name] GENDER: female DOB: [birth year], MRN: [id number]   HPI  Chief Complaint  Patient presents with   Consult    Pt consult for nodules, pt does complain of pain in her chest that isn't provoked by activity. Does experience SOB, no coughing or wheezing to report   [name] is a 76 year old woman with history of mitral regurgitation s/p minimally invassive MV repair and MAZE procedure complicated by LV free wall rupture 10/2019 and scoliosis with 7 spinal surgeries in 2015 who returns to pulmonary clinic for chest pains and shortness of breath.   She reports the dyspnea can occur at rest or with exertion. She has wheezing at night sometimes. She complains of sharp chest pains that are located centrally and goes between her shoulder blades. She has terrible heartburn. She does report trouble swallowing her food/drink. She is using oxycodone every 4 hours for chronic pain due to her neck and back. She has cervical, thoracic and lumbar spine hardware in place.   PFTs 2022 showed normal spirometry but evidence of air trapping and significant bronchodilator response.   She has not noted much benefit from trying albuterol inhaler with her shortness of breath.   She is a former smoker, quit in 1980. She lives alone and is accompanied by her sister today. She is having more difficulty with her ADLs.   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   "right"   Mitral regurgitation    Osteopenia 02/18/2012   Osteoporosis    PAF (paroxysmal atrial fibrillation) ([place]) 2012   Peripheral neuropathy 11/06/2011   PMDD (premenstrual dysphoric disorder)    PONV (postoperative nausea and vomiting)    nausea, vomiting, hives and dizziness    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/2020   Complete bilateral atrial lesion set using cryothermy and bipolar radiofrequency ablation with clipping of LA appendage via right mini-thoracotomy approach   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   Complex valvuloplasty including artificial Gore-tex neochord placement x12 with 28m Sorin Memo 4D ring annuloplasty   Seasonal allergies    Vaginal delivery    ONE NSVD   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     Family History  Problem Relation Age of Onset   Diabetes Father    Hyperlipidemia Sister    Heart disease Sister    Stroke Sister    Diabetes Brother    Hyperlipidemia Sister    Heart disease Sister    Arthritis Mother    Heart disease Mother    Uterine cancer Mother    Diabetes Brother    Heart Problems Brother      Social History   Socioeconomic History   Marital status: Divorced    Spouse name: none/divorced   Number of children: Not on file   Years  of education: Not on file   Highest education level: 12th grade  Occupational History   Occupation: retired  Tobacco Use   Smoking status: Former    Packs/day: 0.10    Years: 14.00    Total pack years: 1.40    Types: Cigarettes    Quit date: 1980    Years since quitting: 43.7   Smokeless tobacco: Never   Tobacco comments:    03/31/2018 "quit ~ 1980; someday smoker when I did smoke; never addicted"  Vaping Use   Vaping Use: Never used  Substance and Sexual Activity   Alcohol use: Never   Drug use: Yes    Types: Oxycodone, Benzodiazepines    Comment: 03/31/2018 "for chronic neck and back pain", takes Klonopin at times.    Sexual  activity: Not Currently  Other Topics Concern   Not on file  Social History Narrative   Lives by herself.    Social Determinants of Health   Financial Resource Strain: Low Risk  (10/10/2021)   Overall Financial Resource Strain (CARDIA)    Difficulty of Paying Living Expenses: Not hard at all  Food Insecurity: No Food Insecurity (10/10/2021)   Hunger Vital Sign    Worried About Running Out of Food in the Last Year: Never true    Ran Out of Food in the Last Year: Never true  Transportation Needs: No Transportation Needs (10/10/2021)   PRAPARE - Hydrologist (Medical): No    Lack of Transportation (Non-Medical): No  Physical Activity: Inactive (10/10/2021)   Exercise Vital Sign    Days of Exercise per Week: 0 days    Minutes of Exercise per Session: 0 min  Stress: No Stress Concern Present (10/10/2021)   [place]    Feeling of Stress : Not at all  Social Connections: Socially Isolated (10/10/2021)   Social Connection and Isolation Panel [NHANES]    Frequency of Communication with Friends and Family: Twice a week    Frequency of Social Gatherings with Friends and Family: Twice a week    Attends Religious Services: Never    Marine scientist or Organizations: No    Attends Archivist Meetings: Never    Marital Status: Divorced  Human resources officer Violence: Not At Risk (10/10/2021)   Humiliation, Afraid, Rape, and Kick questionnaire    Fear of Current or Ex-Partner: No    Emotionally Abused: No    Physically Abused: No    Sexually Abused: No     Allergies  Allergen Reactions   Buprenorphine Nausea Only and Other (See Comments)    sedation and adhesive reaction Other reaction(s): nausea, sedation and adhesive reaction Other reaction(s): nausea, sedation and adhesive reaction, Other (See Comments), Other (See Comments) sedation and adhesive reaction Other reaction(s): nausea,  sedation and adhesive reaction sedation and adhesive reaction Other reaction(s): nausea, sedation and adhesive reaction sedation and adhesive reaction Other reaction(s): nausea, sedation and adhesive reaction sedation and adhesive reaction Other reaction(s): nausea, sedation and adhesive reaction sedation and adhesive reaction Other reaction(s): nausea, sedation and adhesive reaction sedation and adhesive reaction Other reaction(s): nausea, sedation and adhesive reaction   Erythromycin Nausea And Vomiting and Other (See Comments)    Dizziness  Other reaction(s): Other (See Comments) Dizziness  unknown unknown Dizziness  BURNING ALL OVER  BURNING ALL OVER  unknown Dizziness  BURNING ALL OVER    Iodinated Contrast Media Hives, Nausea Only and Rash    unknown  unknown unknown   Latex Palpitations, Other (See Comments) and Rash     hives and sores Other reaction(s): hives and sores Other reaction(s):  hives and sores, Other (See Comments), Other (See Comments)  hives and sores Other reaction(s): hives and sores hives hives  hives and sores Other reaction(s): hives and sores hives Sores  hives and sores Other reaction(s): hives and sores hives Sores  hives and sores Other reaction(s): hives and sores hives  hives and sores Other reaction(s): hives and sores hives Sores  hives and sores Other reaction(s): hives and sores   Other Other (See Comments)    CAN NOT TAKE , aLPRAZOLAM, OR ELAVIL DUE TO AFIB FOR ANXIETY  IV CONTRAST /"DYE". Other reaction(s): hives and sores Other reaction(s): Other (See Comments), Other (See Comments) CAN NOT TAKE , aLPRAZOLAM, OR ELAVIL DUE TO AFIB FOR ANXIETY  IV CONTRAST /"DYE". Other reaction(s): hives and sores IV CONTRAST /"DYE". CAN NOT TAKE , aLPRAZOLAM, OR ELAVIL DUE TO AFIB FOR ANXIETY  IV CONTRAST /"DYE". Other reaction(s): hives and sores Patient states that every time she has anesthesia, she wakes up vomiting, has  chills, blood pressure drops CAN NOT TAKE , aLPRAZOLAM, OR ELAVIL DUE TO AFIB FOR ANXIETY  IV CONTRAST /"DYE". Other reaction(s): hives and sores IV CONTRAST /"DYE". Patient states that every time she has anesthesia, she wakes up vomiting, has chills, blood pressure drops CAN NOT TAKE , aLPRAZOLAM, OR ELAVIL DUE TO AFIB FOR ANXIETY  IV CONTRAST /"DYE". Other reaction(s): hives and sores IV CONTRAST /"DYE". CAN NOT TAKE , aLPRAZOLAM, OR ELAVIL DUE TO AFIB FOR ANXIETY  IV CONTRAST /"DYE". Other reaction(s): hives and sores Patient states that every time she has anesthesia, she wakes up vomiting, has chills, blood pressure drops CAN NOT TAKE , aLPRAZOLAM, OR ELAVIL DUE TO AFIB FOR ANXIETY  IV CONTRAST /"DYE". Other reaction(s): hives and sores   Tape Rash, Other (See Comments) and Itching    Heart monitor stickers must be rotated in order to prevent rash Other reaction(s): hives and sores Adhesive on EKG tabs=BURNS SKIN**PAPER TAPE OK** sores Adhesive on EKG tabs. Per Pt, Ok w/Paper Tape Adhesive on EKG tabs=BURNS SKIN**PAPER TAPE OK** sores Other reaction(s): Other (See Comments) Heart monitor stickers must be rotated in order to prevent rash Adhesive on EKG tabs=BURNS SKIN**PAPER TAPE OK** sores   Ciprofloxacin Other (See Comments)    Other reaction(s): Other (See Comments) Burning all over Burning all over Burning all over   Duloxetine Hcl     Severe diarrhea and upset stomach Other reaction(s): Other (See Comments), Unknown Severe diarrhea and upset stomach Severe diarrhea and upset stomach Severe diarrhea and upset stomach   Erythromycin Base     Other reaction(s): Other (See Comments), Unknown   Hydrocodone Other (See Comments)    Sedation,dizziness, and nausea Other reaction(s): sedation and nausea Other reaction(s): sedation and nausea Other reaction(s): Other (See Comments), Other (See Comments), sedation and nausea Sedation,dizziness, and nausea Other  reaction(s): sedation and nausea Other reaction(s): sedation and nausea Sedation,dizziness, and nausea Other reaction(s): sedation and nausea Other reaction(s): sedation and nausea Sedation,dizziness, and nausea Other reaction(s): sedation and nausea Other reaction(s): sedation and nausea Sedation,dizziness, and nausea Other reaction(s): sedation and nausea Other reaction(s): sedation and nausea Sedation,dizziness, and nausea Other reaction(s): sedation and nausea Other reaction(s): sedation and nausea Sedation,dizziness, and nausea Other reaction(s): sedation and nausea Other reaction(s): sedation and nausea   Hydromorphone Other (See Comments)    Sedation,dizziness, and nausea Other reaction(s): sedation and nausea  Other reaction(s): sedation and nausea Other reaction(s): Other (See Comments), Other (See Comments), sedation and nausea Sedation,dizziness, and nausea Other reaction(s): sedation and nausea Other reaction(s): sedation and nausea Sedation,dizziness, and nausea Other reaction(s): sedation and nausea Other reaction(s): sedation and nausea Sedation,dizziness, and nausea Other reaction(s): sedation and nausea Other reaction(s): sedation and nausea Sedation,dizziness, and nausea Other reaction(s): sedation and nausea Other reaction(s): sedation and nausea Sedation,dizziness, and nausea Other reaction(s): sedation and nausea Other reaction(s): sedation and nausea Sedation,dizziness, and nausea Other reaction(s): sedation and nausea Other reaction(s): sedation and nausea   Iodine     unknown Other reaction(s): Other (See Comments) unknown unknown unknown unknown unknown unknown   Levofloxacin Nausea And Vomiting and Nausea Only   Metrizamide Hives   Nsaids     CANNOT TAKE PER CARDIOLOGIST DUE TO AFIB    Nucynta [Tapentadol Hcl]     nausea and sedation   Oxycodone Other (See Comments)    Delusions (intolerance)  PILLS ONLY sedation, dizziness, nausea,   and itching Other reaction(s): sedation and nausea Other reaction(s): sedation and nausea Other reaction(s): Hallucinations, Other Other reaction(s): Other (See Comments), sedation and nausea Delusions (intolerance)  PILLS ONLY sedation, dizziness, nausea,  and itching Other reaction(s): sedation and nausea Other reaction(s): sedation and nausea Delusions (intolerance)  PILLS ONLY sedation, dizziness, nausea,  and itching Other reaction(s): sedation and nausea Other reaction(s): sedation and nausea Delusions (intolerance)  PILLS ONLY sedation, dizziness, nausea,  and itching Other reaction(s): sedation and nausea Other reaction(s): sedation and nausea Delusions (intolerance)  PILLS ONLY sedation, dizziness, nausea,  and itching Other reaction(s): sedation and nausea Other reaction(s): sedation and nausea Delusions (intolerance)  PILLS ONLY sedation, dizziness, nausea,  and itching Other reaction(s): sedation and nausea Other reaction(s): sedation and nausea Delusions (intolerance)  PILLS ONLY sedation, dizziness, nausea,  and itching Other reaction(s): sedation and nausea Other reaction(s): sedation and nausea   Pentazocine Other (See Comments)    Unknown Other reaction(s): Unknown Other reaction(s): Unknown Other reaction(s): Other (See Comments), Other (See Comments), Unknown Unknown Other reaction(s): Unknown Other reaction(s): Unknown Unknown Other reaction(s): Unknown Other reaction(s): Unknown Unknown Other reaction(s): Unknown Other reaction(s): Unknown Unknown Other reaction(s): Unknown Other reaction(s): Unknown Unknown Other reaction(s): Unknown Other reaction(s): Unknown Unknown Other reaction(s): Unknown Other reaction(s): Unknown   Septra [Bactrim]     Hives    Sulfasalazine Hives   Morphine Anxiety, Other (See Comments) and Nausea Only    sedation and nausea Other reaction(s): sedation and nausea Other reaction(s): sedation and nausea Other  reaction(s): Delusions (intolerance), Other (See Comments), Other (See Comments), sedation and nausea sedation and nausea Other reaction(s): sedation and nausea Other reaction(s): sedation and nausea sedation and nausea Other reaction(s): sedation and nausea Other reaction(s): sedation and nausea PT CANNOT WAKE UP AFTER TAKING MEDICATION AND HAS NIGHTMARES  sedation and nausea Other reaction(s): sedation and nausea Other reaction(s): sedation and nausea PT CANNOT WAKE UP AFTER TAKING MEDICATION AND HAS NIGHTMARES  sedation and nausea Other reaction(s): sedation and nausea Other reaction(s): sedation and nausea sedation and nausea Other reaction(s): sedation and nausea Other reaction(s): sedation and nausea PT CANNOT WAKE UP AFTER TAKING MEDICATION AND HAS NIGHTMARES  sedation and nausea Other reaction(s): sedation and nausea Other reaction(s): sedation and nausea   Sulfa Antibiotics Hives, Nausea Only and Rash    rash rash rash   Sulfamethoxazole-Trimethoprim Rash    Hives  Hives  Hives  Hives  Hives  Hives    Tapentadol Nausea Only    Other reaction(s): nausea and  sedation, Other (See Comments) nausea and sedation nausea and sedation     Outpatient Medications Prior to Visit  Medication Sig Dispense Refill   acetaminophen (TYLENOL) 500 MG tablet Take 500 mg by mouth every 6 (six) hours as needed for moderate pain.      apixaban (ELIQUIS) 5 MG TABS tablet Take 1 tablet (5 mg total) by mouth 2 (two) times daily. 180 tablet 3   b complex vitamins tablet Take 1 tablet by mouth daily.     Calcium Carbonate Antacid (TUMS CHEWY BITES PO) Take 1 tablet by mouth daily as needed (reflux).      Calcium Citrate-Vitamin D (CALCIUM + D PO) Take 1 tablet by mouth daily.     Carboxymethylcellul-Glycerin (LUBRICATING EYE DROPS OP) Place 1 drop into both eyes daily as needed (dry eyes).     clonazePAM (KLONOPIN) 0.5 MG tablet Take 1 tablet by mouth twice daily as needed for anxiety 30  tablet 3   cyclobenzaprine (FLEXERIL) 10 MG tablet Take 10 mg by mouth at bedtime as needed for muscle spasms.   5   denosumab (PROLIA) 60 MG/ML SOSY injection Inject 60 mg into the skin every 6 (six) months.     furosemide (LASIX) 20 MG tablet Take on Tue-Thur-Sat 36 tablet 3   furosemide (LASIX) 40 MG tablet Take on M-W-F- Sun 48 tablet 3   levothyroxine (SYNTHROID) 75 MCG tablet Take 1 tablet by mouth once daily 90 tablet 0   metoprolol tartrate (LOPRESSOR) 50 MG tablet Take 1 tablet (50 mg total) by mouth 2 (two) times daily. 180 tablet 3   Nutritional Supplements (,FEEDING SUPPLEMENT, PROSOURCE PLUS) liquid Take 30 mLs by mouth 2 (two) times daily between meals. 887 mL 0   nystatin (MYCOSTATIN/NYSTOP) powder Apply topically 2 (two) times daily as needed (skin irritation (under breasts)). 15 g 0   ondansetron (ZOFRAN) 4 MG tablet Take 1 tablet (4 mg total) by mouth every 8 (eight) hours as needed. 30 tablet 0   oxyCODONE (ROXICODONE) 5 MG/5ML solution Take 4 mLs (4 mg total) by mouth every 4 (four) hours as needed for moderate pain.  0   polyethylene glycol (MIRALAX / GLYCOLAX) packet Take 17 g by mouth daily as needed for mild constipation.     potassium chloride (KLOR-CON) 10 MEQ tablet Take 2 tablets by mouth daily for one week only. 14 tablet 0   promethazine (PHENERGAN) 25 MG tablet Take 1 tablet (25 mg total) by mouth 2 (two) times daily as needed for nausea or vomiting. 20 tablet 0   sodium chloride (OCEAN) 0.65 % SOLN nasal spray Place 1 spray into both nostrils as needed for congestion (nose bleeds).     valACYclovir (VALTREX) 500 MG tablet Take 500 mg by mouth daily as needed (breakouts).      meclizine (ANTIVERT) 25 MG tablet TAKE 1 TABLET BY MOUTH THREE TIMES DAILY AS NEEDED FOR DIZZINESS 30 tablet 0   No facility-administered medications prior to visit.   Review of Systems  Constitutional:  Negative for chills, fever, malaise/fatigue and weight loss.  HENT:  Negative for  congestion, sinus pain and sore throat.   Eyes: Negative.   Respiratory:  Positive for sputum production. Negative for cough, hemoptysis, shortness of breath and wheezing.   Cardiovascular:  Positive for chest pain. Negative for palpitations, orthopnea, claudication and leg swelling.  Gastrointestinal:  Positive for heartburn. Negative for abdominal pain, nausea and vomiting.  Genitourinary: Negative.   Musculoskeletal:  Negative for joint pain and  myalgias.  Skin:  Negative for rash.  Neurological:  Negative for weakness.  Endo/Heme/Allergies: Negative.   Psychiatric/Behavioral:  The patient is nervous/anxious.    Objective:   Vitals:   03/05/22 1607  BP: 112/68  Pulse: 79  SpO2: 95%  Weight: 116 lb 3.2 oz (52.7 kg)  Height: '5\' 6"'$  (1.676 m)   Physical Exam Constitutional:      General: She is not in acute distress.    Appearance: She is not ill-appearing.  HENT:     Head: Normocephalic and atraumatic.  Eyes:     General: No scleral icterus.    Conjunctiva/sclera: Conjunctivae normal.     Pupils: Pupils are equal, round, and reactive to light.  Cardiovascular:     Rate and Rhythm: Normal rate and regular rhythm.     Pulses: Normal pulses.     Heart sounds: Normal heart sounds. No murmur heard. Pulmonary:     Effort: Pulmonary effort is normal.     Breath sounds: Normal breath sounds. No wheezing, rhonchi or rales.  Abdominal:     General: Bowel sounds are normal.     Palpations: Abdomen is soft.  Musculoskeletal:     Right lower leg: No edema.     Left lower leg: No edema.  Lymphadenopathy:     Cervical: No cervical adenopathy.  Skin:    General: Skin is warm and dry.  Neurological:     General: No focal deficit present.     Mental Status: She is alert.  Psychiatric:        Mood and Affect: Mood normal.        Behavior: Behavior normal.        Thought Content: Thought content normal.        Judgment: Judgment normal.    CBC    Component Value Date/Time    WBC 6.6 10/31/2021 1436   RBC 4.52 10/31/2021 1436   HGB 13.4 10/31/2021 1436   HGB 12.4 11/24/2019 1129   HCT 40.6 10/31/2021 1436   HCT 36.5 11/24/2019 1129   PLT 187.0 10/31/2021 1436   PLT 175 11/24/2019 1129   MCV 89.9 10/31/2021 1436   MCV 90 11/24/2019 1129   MCH 25.9 (L) 02/06/2020 0554   MCHC 33.0 10/31/2021 1436   RDW 14.3 10/31/2021 1436   RDW 12.4 11/24/2019 1129   LYMPHSABS 1.5 10/31/2021 1436   MONOABS 0.5 10/31/2021 1436   EOSABS 0.1 10/31/2021 1436   BASOSABS 0.0 10/31/2021 1436      Latest Ref Rng & Units 03/03/2022    2:03 PM 02/21/2022    1:43 PM 01/29/2022    8:49 AM  BMP  Glucose 70 - 99 mg/dL 87  98  77   BUN 8 - 27 mg/dL '26  27  22   '$ Creatinine 0.57 - 1.00 mg/dL 1.11  0.93  0.73   BUN/Creat Ratio 12 - '28 23  29  30   '$ Sodium 134 - 144 mmol/L 147  142  144   Potassium 3.5 - 5.2 mmol/L 5.2  4.1  4.3   Chloride 96 - 106 mmol/L 93  98  102   CO2 20 - 29 mmol/L 33  28  29   Calcium 8.7 - 10.3 mg/dL 9.7  9.6  9.5    Chest imaging: CT Chest 02/13/22 1. Right middle lobe and left upper lobe tree-in-bud nodularity consistent with atypical infection. 2. Interval development of a 6x4 mm subpleural nodule along the left major fissure. No follow-up  needed if patient is low-risk.This recommendation follows the consensus statement: Guidelines for Management of Incidental Pulmonary Nodules Detected on CT Images: From the Fleischner Society 2017; Radiology 2017; 284:228-243. 3. Cardiomegaly. 4. Mitral valve replacement.  PFT:    Latest Ref Rng & Units 01/01/2021   11:49 AM 12/01/2019    9:39 AM  PFT Results  FVC-Pre L 1.55  2.13   FVC-Predicted Pre % 51  69   FVC-Post L 1.67    FVC-Predicted Post % 55    Pre FEV1/FVC % % 74  74   Post FEV1/FCV % % 81    FEV1-Pre L 1.15  1.57   FEV1-Predicted Pre % 50  68   FEV1-Post L 1.36    DLCO uncorrected ml/min/mmHg 21.48  14.09   DLCO UNC% % 105  68   DLCO corrected ml/min/mmHg 22.12  14.56   DLCO COR %Predicted %  108  71   DLVA Predicted % 110  105   TLC L 4.50  5.36   TLC % Predicted % 84  100   RV % Predicted % 131  140     Labs:  Path:  Echo 02/11/22: LV EF 50-55%. RV systolic function is moderately reduced. RV size is moderately enlarged. Mildly elevated pulmonary artery systolic pressure 52.8UXLK. RA mildly dilated.  Heart Catheterization:  Assessment & Plan:   Dysphagia, unspecified type - Plan: SLP modified barium swallow  Gastroesophageal reflux disease without esophagitis - Plan: pantoprazole (PROTONIX) 40 MG tablet  Chest pain, unspecified type  Shortness of breath  Discussion: [name] is a 76 year old woman with history of mitral regurgitation s/p minimally invassive MV repair and MAZE procedure complicated by LV free wall rupture 10/2019 and scoliosis with 7 spinal surgeries in 2015 who returns to pulmonary clinic for chest pains and shortness of breath.   Her chest pains are likely secondary to her spinal and heart surgeries or possible GERD. She is to start pantoprazole '40mg'$  daily.   She is to try stiolto 2 puffs daily given her signs of air trapping and significant bronchodilator response on PFTs last year. We will send in prescription if she notices benefit from this inhaler.   We will schedule her for MBS for evaluation of dysphagia.   Follow up in 3 months.  Freda Jackson, MD [facility] Office: [phone]    Current Outpatient Medications:    acetaminophen (TYLENOL) 500 MG tablet, Take 500 mg by mouth every 6 (six) hours as needed for moderate pain. , Disp: , Rfl:    apixaban (ELIQUIS) 5 MG TABS tablet, Take 1 tablet (5 mg total) by mouth 2 (two) times daily., Disp: 180 tablet, Rfl: 3   b complex vitamins tablet, Take 1 tablet by mouth daily., Disp: , Rfl:    Calcium Carbonate Antacid (TUMS CHEWY BITES PO), Take 1 tablet by mouth daily as needed (reflux). , Disp: , Rfl:    Calcium Citrate-Vitamin D (CALCIUM + D PO), Take 1 tablet by  mouth daily., Disp: , Rfl:    Carboxymethylcellul-Glycerin (LUBRICATING EYE DROPS OP), Place 1 drop into both eyes daily as needed (dry eyes)., Disp: , Rfl:    clonazePAM (KLONOPIN) 0.5 MG tablet, Take 1 tablet by mouth twice daily as needed for anxiety, Disp: 30 tablet, Rfl: 3   cyclobenzaprine (FLEXERIL) 10 MG tablet, Take 10 mg by mouth at bedtime as needed for muscle spasms. , Disp: , Rfl: 5   denosumab (PROLIA) 60 MG/ML SOSY injection, Inject  60 mg into the skin every 6 (six) months., Disp: , Rfl:    furosemide (LASIX) 20 MG tablet, Take on Tue-Thur-Sat, Disp: 36 tablet, Rfl: 3   furosemide (LASIX) 40 MG tablet, Take on M-W-F- Sun, Disp: 48 tablet, Rfl: 3   levothyroxine (SYNTHROID) 75 MCG tablet, Take 1 tablet by mouth once daily, Disp: 90 tablet, Rfl: 0   metoprolol tartrate (LOPRESSOR) 50 MG tablet, Take 1 tablet (50 mg total) by mouth 2 (two) times daily., Disp: 180 tablet, Rfl: 3   Nutritional Supplements (,FEEDING SUPPLEMENT, PROSOURCE PLUS) liquid, Take 30 mLs by mouth 2 (two) times daily between meals., Disp: 887 mL, Rfl: 0   nystatin (MYCOSTATIN/NYSTOP) powder, Apply topically 2 (two) times daily as needed (skin irritation (under breasts))., Disp: 15 g, Rfl: 0   ondansetron (ZOFRAN) 4 MG tablet, Take 1 tablet (4 mg total) by mouth every 8 (eight) hours as needed., Disp: 30 tablet, Rfl: 0   oxyCODONE (ROXICODONE) 5 MG/5ML solution, Take 4 mLs (4 mg total) by mouth every 4 (four) hours as needed for moderate pain., Disp: , Rfl: 0   pantoprazole (PROTONIX) 40 MG tablet, Take 1 tablet (40 mg total) by mouth daily., Disp: 30 tablet, Rfl: 3   polyethylene glycol (MIRALAX / GLYCOLAX) packet, Take 17 g by mouth daily as needed for mild constipation., Disp: , Rfl:    potassium chloride (KLOR-CON) 10 MEQ tablet, Take 2 tablets by mouth daily for one week only., Disp: 14 tablet, Rfl: 0   promethazine (PHENERGAN) 25 MG tablet, Take 1 tablet (25 mg total) by mouth 2 (two) times daily as needed for  nausea or vomiting., Disp: 20 tablet, Rfl: 0   sodium chloride (OCEAN) 0.65 % SOLN nasal spray, Place 1 spray into both nostrils as needed for congestion (nose bleeds)., Disp: , Rfl:    Tiotropium Bromide-Olodaterol (STIOLTO RESPIMAT) 2.5-2.5 MCG/ACT AERS, Inhale 2 puffs into the lungs daily., Disp: 2 each, Rfl: 0   valACYclovir (VALTREX) 500 MG tablet, Take 500 mg by mouth daily as needed (breakouts). , Disp: , Rfl:    meclizine (ANTIVERT) 25 MG tablet, TAKE 1 TABLET BY MOUTH THREE TIMES DAILY AS NEEDED FOR DIZZINESS, Disp: 30 tablet, Rfl: 0  -

## 2022-03-06 ENCOUNTER — Other Ambulatory Visit (HOSPITAL_COMMUNITY): Payer: Self-pay

## 2022-03-06 ENCOUNTER — Encounter: Payer: Self-pay | Admitting: Cardiology

## 2022-03-06 ENCOUNTER — Encounter: Payer: Self-pay | Admitting: Family Medicine

## 2022-03-06 ENCOUNTER — Ambulatory Visit: Payer: Medicare Other | Admitting: Cardiology

## 2022-03-06 ENCOUNTER — Encounter: Payer: Self-pay | Admitting: Pulmonary Disease

## 2022-03-06 DIAGNOSIS — R1310 Dysphagia, unspecified: Secondary | ICD-10-CM

## 2022-03-06 DIAGNOSIS — R059 Cough, unspecified: Secondary | ICD-10-CM

## 2022-03-07 ENCOUNTER — Other Ambulatory Visit: Payer: Self-pay | Admitting: Family Medicine

## 2022-03-07 ENCOUNTER — Telehealth: Payer: Self-pay | Admitting: Pulmonary Disease

## 2022-03-07 ENCOUNTER — Encounter: Payer: Self-pay | Admitting: Family Medicine

## 2022-03-07 NOTE — Telephone Encounter (Signed)
Called and spoke with pt who states when using the Stiolto inhaler. States she got woken up this morning feeling like she was choking having mucus in her throat and congestion in her chest that she was having a hard time trying to get out. Pt said she drank some water and things started getting better.   Pt wanted to know if it would be okay for her to use her Stiolto inhaler and I stated to her that it would be fine as that inhaler is to help with her symptoms and she verbalized understanding. Nothing further needed.

## 2022-03-07 NOTE — Telephone Encounter (Signed)
Spoke with the pt  She states that she does not like using the stiolto, it makes her have sensation of mucus in her throat  She wants to try something else  Please advise, thanks

## 2022-03-08 ENCOUNTER — Telehealth: Payer: Self-pay | Admitting: Home Health

## 2022-03-08 DIAGNOSIS — Z79890 Hormone replacement therapy (postmenopausal): Secondary | ICD-10-CM | POA: Diagnosis not present

## 2022-03-08 DIAGNOSIS — Z882 Allergy status to sulfonamides status: Secondary | ICD-10-CM | POA: Diagnosis not present

## 2022-03-08 DIAGNOSIS — Z91041 Radiographic dye allergy status: Secondary | ICD-10-CM | POA: Diagnosis not present

## 2022-03-08 DIAGNOSIS — Z981 Arthrodesis status: Secondary | ICD-10-CM | POA: Diagnosis not present

## 2022-03-08 DIAGNOSIS — Z888 Allergy status to other drugs, medicaments and biological substances status: Secondary | ICD-10-CM | POA: Diagnosis not present

## 2022-03-08 DIAGNOSIS — Z79899 Other long term (current) drug therapy: Secondary | ICD-10-CM | POA: Diagnosis not present

## 2022-03-08 DIAGNOSIS — K219 Gastro-esophageal reflux disease without esophagitis: Secondary | ICD-10-CM | POA: Diagnosis not present

## 2022-03-08 DIAGNOSIS — Z91048 Other nonmedicinal substance allergy status: Secondary | ICD-10-CM | POA: Diagnosis not present

## 2022-03-08 DIAGNOSIS — R7989 Other specified abnormal findings of blood chemistry: Secondary | ICD-10-CM | POA: Diagnosis not present

## 2022-03-08 DIAGNOSIS — R42 Dizziness and giddiness: Secondary | ICD-10-CM | POA: Diagnosis not present

## 2022-03-08 DIAGNOSIS — Z91040 Latex allergy status: Secondary | ICD-10-CM | POA: Diagnosis not present

## 2022-03-08 DIAGNOSIS — G319 Degenerative disease of nervous system, unspecified: Secondary | ICD-10-CM | POA: Diagnosis not present

## 2022-03-08 DIAGNOSIS — Z881 Allergy status to other antibiotic agents status: Secondary | ICD-10-CM | POA: Diagnosis not present

## 2022-03-08 DIAGNOSIS — Z885 Allergy status to narcotic agent status: Secondary | ICD-10-CM | POA: Diagnosis not present

## 2022-03-08 DIAGNOSIS — Z87891 Personal history of nicotine dependence: Secondary | ICD-10-CM | POA: Diagnosis not present

## 2022-03-08 DIAGNOSIS — I4891 Unspecified atrial fibrillation: Secondary | ICD-10-CM | POA: Diagnosis not present

## 2022-03-08 DIAGNOSIS — R531 Weakness: Secondary | ICD-10-CM | POA: Diagnosis not present

## 2022-03-08 NOTE — Telephone Encounter (Signed)
Patient called reporting severe dizziness since yesterday, she states symptoms are persistent, she can barely walk and felt the need to hold on things. She is worried that her lasix dosing increase was causing the symptoms. She denied any chest pain or SOB or syncope. Advised patient to check her BP, she reports back first time BP 153/61, AP 68, second time 163/83, AP 65, third time BP 152/82, AP 65. She states her BP is usually lower and she is upset and agitated. Advised the patient go to the nearest ER for evaluation, does not appear lasix is causing hypotension associated dizziness at  this time, in person evaluation is needed given her significant persistent symptoms.

## 2022-03-11 ENCOUNTER — Ambulatory Visit (HOSPITAL_COMMUNITY)
Admission: RE | Admit: 2022-03-11 | Discharge: 2022-03-11 | Disposition: A | Discharge status: Home / Self Care | Payer: Medicare Other | Source: Ambulatory Visit | Attending: Family Medicine | Admitting: Family Medicine

## 2022-03-11 ENCOUNTER — Telehealth: Payer: Self-pay | Admitting: *Deleted

## 2022-03-11 DIAGNOSIS — R059 Cough, unspecified: Secondary | ICD-10-CM

## 2022-03-11 DIAGNOSIS — R1310 Dysphagia, unspecified: Secondary | ICD-10-CM

## 2022-03-11 DIAGNOSIS — R490 Dysphonia: Secondary | ICD-10-CM | POA: Diagnosis not present

## 2022-03-11 NOTE — Telephone Encounter (Signed)
Patient states the reflux medication was not called in- states that we were going to check with her PCP and cardiologist, but she never heard back. Also, patient called 9/1 regarding side effects from [place] (see previous encounter) states that she talked to a second nurse on Friday, 9/1 that told her if she was having that hard of a time to stop taking it. Patient stopped taking Stiolto on Friday and needs another inhaler to help her breathing. Uses [facility] on [address] in [place].  Wanting to let JD know she took swallowing test about an hour ago.  Please advise.

## 2022-03-11 NOTE — Telephone Encounter (Signed)
Dr Erin Fulling,  In your last office note on 03/06/2022 you stated you were going to talk with another doctor about prontix for this patients acid reflux.   I called and informed the patient of this and she stated that you were supposed to send it in. But I wanted to double check with you first.  Please advise sir

## 2022-03-13 ENCOUNTER — Telehealth: Payer: Self-pay | Admitting: Pulmonary Disease

## 2022-03-13 ENCOUNTER — Encounter: Payer: Self-pay | Admitting: Family Medicine

## 2022-03-13 MED ORDER — PANTOPRAZOLE SODIUM 40 MG PO TBEC: 40.0000 mg | DELAYED_RELEASE_TABLET | Freq: Every day | ORAL | 3 refills | Status: DC

## 2022-03-13 NOTE — Telephone Encounter (Signed)
Patient can stop stiolto if she is experiencing side effects. I am awaiting reply from Dr. Johney Frame regarding starting treatment for possible reflux.   Thanks, JD

## 2022-03-13 NOTE — Telephone Encounter (Signed)
Patient called back very concerned about what she should be taking. Stiolto gives her side effects, she doesn't have her reflux medication. Please contact patient at [phone].

## 2022-03-13 NOTE — Telephone Encounter (Signed)
Called and spoke with patient.  Dr. August Albino recommendations given. Understanding stated. Patient stated she would wait for updates for possible reflux.

## 2022-03-13 NOTE — Telephone Encounter (Signed)
Patient called back very concerned (another encounter from 03/11/2022) about what she should be taking. Stiolto gives her side effects, she doesn't have her reflux medication. Patient states she just needs to know what she needs to be doing.  Please contact patient at [phone]

## 2022-03-13 NOTE — Telephone Encounter (Signed)
Called and spoke to patient about medication that was sent in by JD> Patient verbalized understanding. Nothing further needed

## 2022-03-13 NOTE — Telephone Encounter (Signed)
I sent in prescription for patient to start pantoprazole for her reflux disease. There were no issues from Dr. Jacolyn Reedy standpoint.  Thanks, JD

## 2022-03-16 ENCOUNTER — Encounter: Payer: Self-pay | Admitting: Pulmonary Disease

## 2022-03-18 ENCOUNTER — Ambulatory Visit: Payer: Medicare Other | Attending: Cardiology

## 2022-03-18 DIAGNOSIS — I5032 Chronic diastolic (congestive) heart failure: Secondary | ICD-10-CM

## 2022-03-18 DIAGNOSIS — Z79899 Other long term (current) drug therapy: Secondary | ICD-10-CM

## 2022-03-18 NOTE — Progress Notes (Signed)
Chronic Care Management Pharmacy Note  03/28/2022 Name:  [name] MRN:  [id number] DOB:  Jan 09, 1946  Summary: PharmD Follow up visit.  Main concern is lung nodule and bronchiectasis.  Encouraged her to contact pulm to see if she needs an earlier follow up to ease her mind.  Recommendations/Changes made from today's visit: No changes to meds   Plan: FU 6 months   Subjective: [name] is an 76 y.o. year old female who is a primary patient of Tabori, [name], MD.  The CCM team was consulted for assistance with disease management and care coordination needs.    Engaged with patient by telephone for follow up visit in response to provider referral for pharmacy case management and/or care coordination services.   Consent to Services:  The patient was given the following information about Chronic Care Management services today, agreed to services, and gave verbal consent: 1. CCM service includes personalized support from designated clinical staff supervised by the primary care provider, including individualized plan of care and coordination with other care providers 2. 24/7 contact phone numbers for assistance for urgent and routine care needs. 3. Service will only be billed when office clinical staff spend 20 minutes or more in a month to coordinate care. 4. Only one practitioner may furnish and bill the service in a calendar month. 5.The patient may stop CCM services at any time (effective at the end of the month) by phone call to the office staff. 6. The patient will be responsible for cost sharing (co-pay) of up to 20% of the service fee (after annual deductible is met). Patient agreed to services and consent obtained.  Patient Care Team: [name], MD as PCP - General (Family Medicine) Constance Haw, MD as PCP - Electrophysiology (Cardiology) Freada Bergeron, MD as PCP - Cardiology (Cardiology) Domingo Pulse, MD as Consulting Physician (Urology) Richmond Campbell, MD as Consulting Physician (Gastroenterology) Atilano Ina, MD as Referring Physician (Neurosurgery) Terrance Mass, MD (Inactive) as Consulting Physician (Gynecology) Jacolyn Reedy, MD as Consulting Physician (Cardiology) Kriste Basque, MD as Referring Physician (Pain Medicine) [facility] [place] Rolm Bookbinder, MD as Consulting Physician (Dermatology) Brager Kerbs, MD as Consulting Physician (Specialist) Rexene Alberts, MD (Inactive) as Consulting Physician (Cardiothoracic Surgery) Melvenia Beam, MD as Consulting Physician (Neurology) [name], DDS as Consulting Physician (Dentistry) Melvenia Beam, MD as Consulting Physician (Neurology) [name], [facility] as Pharmacist (Pharmacist) [name], [facility] (Pharmacist)  Recent office visits:  11/28/21 [name], MD - Family Medicine - Anxiety and Depression - Continue the Mirtazapine 1/2 tab nightly x2 more weeks. Then increase to 1 tab nightly. Follow up in 3 months.    Recent consult visits:  01/29/22 [name] MD - Cardiology - Chronic diastolic heart failure - furosemide (LASIX) 40 MG tablet prescribed (increase). Follow up as scheduled.    11/21/21 [name], MD - Sports Medicine - Closed fracture of left patella - MRI is showing a fracture of the central portion of the tibial plateau.  She has noticed her pain improving. Counseled on home exercise therapy and supportive care.Advised to limit her weightbearing as the pain continues to improve. Could consider physical therapy.   11/14/21 [name], PA-C - Prolia injection administered. Follow up in 6 months    Hospital visits: 01/25/22 Medication Reconciliation was completed by comparing discharge summary, patient's EMR and Pharmacy list, and upon discussion with patient.   Admitted to the hospital on  01/25/22 due to Chest pain. Discharge date was 01/25/22. Discharged from [facility]?Medications Started at [facility] Discharge:?? None noted.    Medication Changes at Hospital Discharge: None noted.   Medications Discontinued at Hospital Discharge: None noted.   Medications that remain the same after Hospital Discharge:??  All other medications will remain the same.     Objective:  Lab Results  Component Value Date   CREATININE 0.78 03/18/2022   BUN 23 03/18/2022   GFR 75.67 10/31/2021   EGFR 78 03/18/2022   GFRNONAA >60 02/06/2020   GFRAA >60 02/06/2020   NA 145 (H) 03/18/2022   K 4.5 03/18/2022   CALCIUM 8.9 03/18/2022   CO2 26 03/18/2022   GLUCOSE 93 03/18/2022    Lab Results  Component Value Date/Time   HGBA1C 5.6 04/16/2020 04:09 PM   HGBA1C 5.3 01/13/2020 12:00 PM   GFR 75.67 10/31/2021 02:36 PM   GFR 86.81 08/13/2021 11:28 AM    Last diabetic Eye exam: No results found for: "HMDIABEYEEXA"  Last diabetic Foot exam: No results found for: "HMDIABFOOTEX"   Lab Results  Component Value Date   CHOL 175 12/10/2020   HDL 80.40 12/10/2020   LDLCALC 79 12/10/2020   LDLDIRECT 136.5 06/08/2013   TRIG 79.0 12/10/2020   CHOLHDL 2 12/10/2020       Latest Ref Rng & Units 10/31/2021    2:36 PM 12/10/2020    1:38 PM 07/11/2020   10:03 AM  Hepatic Function  Total Protein 6.0 - 8.3 g/dL 7.3  6.9  7.4   Albumin 3.5 - 5.2 g/dL 4.5  4.2  4.3   AST 0 - 37 U/L '25  22  30   ' ALT 0 - 35 U/L '22  17  24   ' Alk Phosphatase 39 - 117 U/L 70  58  61   Total Bilirubin 0.2 - 1.2 mg/dL 0.5  0.4  0.4   Bilirubin, Direct 0.0 - 0.3 mg/dL 0.1  0.1  0.1     Lab Results  Component Value Date/Time   TSH 0.36 10/31/2021 02:36 PM   TSH 0.40 08/28/2021 10:56 AM   FREET4 1.58 04/17/2021 12:27 PM   FREET4 1.34 12/10/2020 01:38 PM       Latest Ref Rng & Units 10/31/2021    2:36 PM 08/28/2021   10:56 AM 08/13/2021   11:28 AM  CBC  WBC 4.0 - 10.5 K/uL 6.6  4.7  3.2   Hemoglobin 12.0 - 15.0 g/dL 13.4  12.5  13.1   Hematocrit 36.0 - 46.0 % 40.6  38.0  40.2   Platelets  150.0 - 400.0 K/uL 187.0  180.0  121.0     Lab Results  Component Value Date/Time   VD25OH 27.58 (L) 09/23/2017 02:20 PM   VD25OH 46 10/26/2015 11:40 AM   VD25OH 27 (L) 07/25/2015 12:31 PM    Clinical ASCVD: Yes  The 10-year ASCVD risk score (Arnett DK, et al., 2019) is: 27.2%   Values used to calculate the score:     Age: 52 years     Sex: Female     Is Non-Hispanic African American: No     Diabetic: No     Tobacco smoker: No     Systolic Blood Pressure: 876 mmHg     Is BP treated: Yes     HDL Cholesterol: 80.4 mg/dL     Total Cholesterol: 175 mg/dL       02/06/2022    1:08 PM  10/31/2021    1:59 PM 10/10/2021    1:44 PM  Depression screen PHQ 2/9  Decreased Interest 1 1 0  Down, Depressed, Hopeless 1 1 0  PHQ - 2 Score 2 2 0  Altered sleeping 0 0   Tired, decreased energy 1 1   Change in appetite 0 1   Feeling bad or failure about yourself  0 0   Trouble concentrating 0 0   Moving slowly or fidgety/restless 0 0   Suicidal thoughts  0   PHQ-9 Score 3 4   Difficult doing work/chores Not difficult at all Very difficult      Social History   Tobacco Use  Smoking Status Former   Packs/day: 0.10   Years: 14.00   Total pack years: 1.40   Types: Cigarettes   Quit date: 1980   Years since quitting: 43.7  Smokeless Tobacco Never  Tobacco Comments   03/31/2018 "quit ~ 1980; someday smoker when I did smoke; never addicted"   BP Readings from Last 3 Encounters:  03/05/22 112/68  02/27/22 98/60  02/06/22 128/76   Pulse Readings from Last 3 Encounters:  03/05/22 79  02/27/22 72  02/06/22 67   Wt Readings from Last 3 Encounters:  03/05/22 116 lb 3.2 oz (52.7 kg)  02/27/22 113 lb 2 oz (51.3 kg)  02/06/22 115 lb 4 oz (52.3 kg)   BMI Readings from Last 3 Encounters:  03/05/22 18.76 kg/m  02/27/22 18.26 kg/m  02/06/22 18.60 kg/m    Assessment/Interventions: Review of patient past medical history, allergies, medications, health status, including review of  consultants reports, laboratory and other test data, was performed as part of comprehensive evaluation and provision of chronic care management services.   SDOH:  (Social Determinants of Health) assessments and interventions performed: No Financial Resource Strain: Low Risk  (10/10/2021)   Overall Financial Resource Strain (CARDIA)    Difficulty of Paying Living Expenses: Not hard at all    [place] Office Visit from 10/31/2021 in [place] [facility] from 10/10/2021 in [place] Office Visit from 05/06/2021 in [place] Office Visit from 01/15/2021 in [place] Patient Outreach Telephone from 08/09/2020 in [place] Patient Outreach Telephone from 07/02/2020 in [place] Interventions        Food Insecurity Interventions -- Intervention Not Indicated -- -- Intervention Not Indicated Intervention Not Indicated  Housing Interventions -- Intervention Not Indicated -- -- Intervention Not Indicated Intervention Not Indicated  Transportation Interventions -- Intervention Not Indicated -- -- Intervention Not Indicated Intervention Not Indicated  Depression Interventions/Treatment  PHQ2-9 Score <4 Follow-up Not Indicated -- PHQ2-9 Score <4 Follow-up Not Indicated Medication -- --  Financial Strain Interventions -- Intervention Not Indicated -- -- -- --  Physical Activity Interventions -- Intervention Not Indicated -- -- -- --  Stress Interventions -- Intervention Not Indicated -- -- -- Intervention Not Indicated  Social Connections Interventions -- Intervention Not Indicated -- -- -- --      SDOH Screenings   Food Insecurity: No Food Insecurity (10/10/2021)  Housing: Low Risk  (10/10/2021)  Transportation Needs: No  Transportation Needs (10/10/2021)  Alcohol Screen: Low Risk  (10/10/2021)  Depression (PHQ2-9): Low Risk  (02/06/2022)  Financial Resource Strain: Low Risk  (10/10/2021)  Physical Activity: Inactive (10/10/2021)  Social Connections: Socially Isolated (10/10/2021)  Stress: No Stress Concern Present (  10/10/2021)  Tobacco Use: Medium Risk (03/16/2022)    CCM Care Plan  Allergies  Allergen Reactions   Buprenorphine Nausea Only and Other (See Comments)    sedation and adhesive reaction Other reaction(s): nausea, sedation and adhesive reaction Other reaction(s): nausea, sedation and adhesive reaction, Other (See Comments), Other (See Comments) sedation and adhesive reaction Other reaction(s): nausea, sedation and adhesive reaction sedation and adhesive reaction Other reaction(s): nausea, sedation and adhesive reaction sedation and adhesive reaction Other reaction(s): nausea, sedation and adhesive reaction sedation and adhesive reaction Other reaction(s): nausea, sedation and adhesive reaction sedation and adhesive reaction Other reaction(s): nausea, sedation and adhesive reaction sedation and adhesive reaction Other reaction(s): nausea, sedation and adhesive reaction   Erythromycin Nausea And Vomiting and Other (See Comments)    Dizziness  Other reaction(s): Other (See Comments) Dizziness  unknown unknown Dizziness  BURNING ALL OVER  BURNING ALL OVER  unknown Dizziness  BURNING ALL OVER    Iodinated Contrast Media Hives, Nausea Only and Rash    unknown unknown unknown   Latex Palpitations, Other (See Comments) and Rash     hives and sores Other reaction(s): hives and sores Other reaction(s):  hives and sores, Other (See Comments), Other (See Comments)  hives and sores Other reaction(s): hives and sores hives hives  hives and sores Other reaction(s): hives and sores hives Sores  hives and sores Other reaction(s): hives and sores hives Sores  hives and sores Other reaction(s):  hives and sores hives  hives and sores Other reaction(s): hives and sores hives Sores  hives and sores Other reaction(s): hives and sores   Other Other (See Comments)    CAN NOT TAKE , aLPRAZOLAM, OR ELAVIL DUE TO AFIB FOR ANXIETY  IV CONTRAST /"DYE". Other reaction(s): hives and sores Other reaction(s): Other (See Comments), Other (See Comments) CAN NOT TAKE , aLPRAZOLAM, OR ELAVIL DUE TO AFIB FOR ANXIETY  IV CONTRAST /"DYE". Other reaction(s): hives and sores IV CONTRAST /"DYE". CAN NOT TAKE , aLPRAZOLAM, OR ELAVIL DUE TO AFIB FOR ANXIETY  IV CONTRAST /"DYE". Other reaction(s): hives and sores Patient states that every time she has anesthesia, she wakes up vomiting, has chills, blood pressure drops CAN NOT TAKE , aLPRAZOLAM, OR ELAVIL DUE TO AFIB FOR ANXIETY  IV CONTRAST /"DYE". Other reaction(s): hives and sores IV CONTRAST /"DYE". Patient states that every time she has anesthesia, she wakes up vomiting, has chills, blood pressure drops CAN NOT TAKE , aLPRAZOLAM, OR ELAVIL DUE TO AFIB FOR ANXIETY  IV CONTRAST /"DYE". Other reaction(s): hives and sores IV CONTRAST /"DYE". CAN NOT TAKE , aLPRAZOLAM, OR ELAVIL DUE TO AFIB FOR ANXIETY  IV CONTRAST /"DYE". Other reaction(s): hives and sores Patient states that every time she has anesthesia, she wakes up vomiting, has chills, blood pressure drops CAN NOT TAKE , aLPRAZOLAM, OR ELAVIL DUE TO AFIB FOR ANXIETY  IV CONTRAST /"DYE". Other reaction(s): hives and sores   Tape Rash, Other (See Comments) and Itching    Heart monitor stickers must be rotated in order to prevent rash Other reaction(s): hives and sores Adhesive on EKG tabs=BURNS SKIN**PAPER TAPE OK** sores Adhesive on EKG tabs. Per Pt, Ok w/Paper Tape Adhesive on EKG tabs=BURNS SKIN**PAPER TAPE OK** sores Other reaction(s): Other (See Comments) Heart monitor stickers must be rotated in order to prevent rash Adhesive on EKG tabs=BURNS SKIN**PAPER TAPE  OK** sores   Ciprofloxacin Other (See Comments)    Other reaction(s): Other (See Comments) Burning all over Burning all over Burning all over  Duloxetine Hcl     Severe diarrhea and upset stomach Other reaction(s): Other (See Comments), Unknown Severe diarrhea and upset stomach Severe diarrhea and upset stomach Severe diarrhea and upset stomach   Erythromycin Base     Other reaction(s): Other (See Comments), Unknown   Hydrocodone Other (See Comments)    Sedation,dizziness, and nausea Other reaction(s): sedation and nausea Other reaction(s): sedation and nausea Other reaction(s): Other (See Comments), Other (See Comments), sedation and nausea Sedation,dizziness, and nausea Other reaction(s): sedation and nausea Other reaction(s): sedation and nausea Sedation,dizziness, and nausea Other reaction(s): sedation and nausea Other reaction(s): sedation and nausea Sedation,dizziness, and nausea Other reaction(s): sedation and nausea Other reaction(s): sedation and nausea Sedation,dizziness, and nausea Other reaction(s): sedation and nausea Other reaction(s): sedation and nausea Sedation,dizziness, and nausea Other reaction(s): sedation and nausea Other reaction(s): sedation and nausea Sedation,dizziness, and nausea Other reaction(s): sedation and nausea Other reaction(s): sedation and nausea   Hydromorphone Other (See Comments)    Sedation,dizziness, and nausea Other reaction(s): sedation and nausea Other reaction(s): sedation and nausea Other reaction(s): Other (See Comments), Other (See Comments), sedation and nausea Sedation,dizziness, and nausea Other reaction(s): sedation and nausea Other reaction(s): sedation and nausea Sedation,dizziness, and nausea Other reaction(s): sedation and nausea Other reaction(s): sedation and nausea Sedation,dizziness, and nausea Other reaction(s): sedation and nausea Other reaction(s): sedation and nausea Sedation,dizziness, and  nausea Other reaction(s): sedation and nausea Other reaction(s): sedation and nausea Sedation,dizziness, and nausea Other reaction(s): sedation and nausea Other reaction(s): sedation and nausea Sedation,dizziness, and nausea Other reaction(s): sedation and nausea Other reaction(s): sedation and nausea   Iodine     unknown Other reaction(s): Other (See Comments) unknown unknown unknown unknown unknown unknown   Levofloxacin Nausea And Vomiting and Nausea Only   Metrizamide Hives   Nsaids     CANNOT TAKE PER CARDIOLOGIST DUE TO AFIB    Nucynta [Tapentadol Hcl]     nausea and sedation   Oxycodone Other (See Comments)    Delusions (intolerance)  PILLS ONLY sedation, dizziness, nausea,  and itching Other reaction(s): sedation and nausea Other reaction(s): sedation and nausea Other reaction(s): Hallucinations, Other Other reaction(s): Other (See Comments), sedation and nausea Delusions (intolerance)  PILLS ONLY sedation, dizziness, nausea,  and itching Other reaction(s): sedation and nausea Other reaction(s): sedation and nausea Delusions (intolerance)  PILLS ONLY sedation, dizziness, nausea,  and itching Other reaction(s): sedation and nausea Other reaction(s): sedation and nausea Delusions (intolerance)  PILLS ONLY sedation, dizziness, nausea,  and itching Other reaction(s): sedation and nausea Other reaction(s): sedation and nausea Delusions (intolerance)  PILLS ONLY sedation, dizziness, nausea,  and itching Other reaction(s): sedation and nausea Other reaction(s): sedation and nausea Delusions (intolerance)  PILLS ONLY sedation, dizziness, nausea,  and itching Other reaction(s): sedation and nausea Other reaction(s): sedation and nausea Delusions (intolerance)  PILLS ONLY sedation, dizziness, nausea,  and itching Other reaction(s): sedation and nausea Other reaction(s): sedation and nausea   Pentazocine Other (See Comments)    Unknown Other reaction(s): Unknown Other  reaction(s): Unknown Other reaction(s): Other (See Comments), Other (See Comments), Unknown Unknown Other reaction(s): Unknown Other reaction(s): Unknown Unknown Other reaction(s): Unknown Other reaction(s): Unknown Unknown Other reaction(s): Unknown Other reaction(s): Unknown Unknown Other reaction(s): Unknown Other reaction(s): Unknown Unknown Other reaction(s): Unknown Other reaction(s): Unknown Unknown Other reaction(s): Unknown Other reaction(s): Unknown   Septra [Bactrim]     Hives    Sulfasalazine Hives   Morphine Anxiety, Other (See Comments) and Nausea Only    sedation and nausea Other reaction(s): sedation and nausea Other  reaction(s): sedation and nausea Other reaction(s): Delusions (intolerance), Other (See Comments), Other (See Comments), sedation and nausea sedation and nausea Other reaction(s): sedation and nausea Other reaction(s): sedation and nausea sedation and nausea Other reaction(s): sedation and nausea Other reaction(s): sedation and nausea PT CANNOT WAKE UP AFTER TAKING MEDICATION AND HAS NIGHTMARES  sedation and nausea Other reaction(s): sedation and nausea Other reaction(s): sedation and nausea PT CANNOT WAKE UP AFTER TAKING MEDICATION AND HAS NIGHTMARES  sedation and nausea Other reaction(s): sedation and nausea Other reaction(s): sedation and nausea sedation and nausea Other reaction(s): sedation and nausea Other reaction(s): sedation and nausea PT CANNOT WAKE UP AFTER TAKING MEDICATION AND HAS NIGHTMARES  sedation and nausea Other reaction(s): sedation and nausea Other reaction(s): sedation and nausea   Sulfa Antibiotics Hives, Nausea Only and Rash    rash rash rash   Sulfamethoxazole-Trimethoprim Rash    Hives  Hives  Hives  Hives  Hives  Hives    Tapentadol Nausea Only    Other reaction(s): nausea and sedation, Other (See Comments) nausea and sedation nausea and sedation    Medications Reviewed Today     Reviewed by  [name], [facility] (Pharmacist) on 03/28/22 at 1336  Med List Status: <None>   Medication Order Taking? Sig Documenting Provider Last Dose Status Informant  acetaminophen (TYLENOL) 500 MG tablet [id number] Yes Take 500 mg by mouth every 6 (six) hours as needed for moderate pain.  [provider] Taking Active Self  apixaban (ELIQUIS) 5 MG TABS tablet [id number] Yes Take 1 tablet (5 mg total) by mouth 2 (two) times daily. Freada Bergeron, MD Taking Active   b complex vitamins tablet [id number] Yes Take 1 tablet by mouth daily. [provider] Taking Active Self  Calcium Carbonate Antacid (TUMS CHEWY BITES PO) [id number] Yes Take 1 tablet by mouth daily as needed (reflux).  [provider] Taking Active Self  Calcium Citrate-Vitamin D (CALCIUM + D PO) [id number] Yes Take 1 tablet by mouth daily. [provider] Taking Active   Carboxymethylcellul-Glycerin (LUBRICATING EYE DROPS OP) [id number] Yes Place 1 drop into both eyes daily as needed (dry eyes). [provider] Taking Active Self  cephALEXin (KEFLEX) 500 MG capsule [id number] Yes Take 2 grams (4 tablets) by mouth 1 hour prior to dental cleaning/procedure. Freada Bergeron, MD Taking Active   clonazePAM Bobbye [place]) 0.5 MG tablet [id number] Yes Take 1 tablet by mouth twice daily as needed for anxiety [name], MD Taking Active   cyclobenzaprine (FLEXERIL) 10 MG tablet [id number] Yes Take 10 mg by mouth at bedtime as needed for muscle spasms.  [provider] Taking Active Self  denosumab (PROLIA) 60 MG/ML SOSY injection [id number] Yes Inject 60 mg into the skin every 6 (six) months. [provider] Taking Active Self           Med Note Shari Heritage Jun 11, 2020  2:33 PM)    furosemide (LASIX) 20 MG tablet [id number] Yes Take on Tue-Thur-Sat Freada Bergeron, MD Taking Active   furosemide (LASIX) 40 MG tablet [id number] Yes Take on M-W-F- Gabriel Rung, MD Taking Active   levothyroxine (SYNTHROID) 75 MCG tablet [id number] Yes Take 1 tablet by mouth once daily [name], MD Taking Active   meclizine (ANTIVERT) 25 MG tablet [id number] Yes TAKE 1 TABLET BY MOUTH THREE TIMES DAILY AS NEEDED FOR DIZZINESS [name], MD Taking Active   metoprolol tartrate (LOPRESSOR) 50 MG tablet [id number] Yes  Take 1 tablet (50 mg total) by mouth 2 (two) times daily. Freada Bergeron, MD Taking Active   Nutritional Supplements (,FEEDING SUPPLEMENT, PROSOURCE PLUS) liquid [id number] Yes Take 30 mLs by mouth 2 (two) times daily between meals. Bary Leriche, PA-C Taking Active   nystatin (MYCOSTATIN/NYSTOP) powder [id number] Yes Apply topically 2 (two) times daily as needed (skin irritation (under breasts)). Bary Leriche, PA-C Taking Active   ondansetron (ZOFRAN) 4 MG tablet [id number] Yes Take 1 tablet (4 mg total) by mouth every 8 (eight) hours as needed. [name], MD Taking Active   oxyCODONE (ROXICODONE) 5 MG/5ML solution [id number] Yes Take 4 mLs (4 mg total) by mouth every 4 (four) hours as needed for moderate pain. Bary Leriche, PA-C Taking Active            Med Note Shari Heritage Jun 11, 2020  2:33 PM)    pantoprazole (PROTONIX) 40 MG tablet [id number] Yes Take 1 tablet (40 mg total) by mouth daily. Freddi Starr, MD Taking Active   polyethylene glycol [facility] / GLYCOLAX) packet [id number] Yes Take 17 g by mouth daily as needed for mild constipation. [provider] Taking Active Self  potassium chloride (KLOR-CON) 10 MEQ tablet [id number] Yes Take 2 tablets by mouth daily for one week only. Freada Bergeron, MD Taking Active   promethazine (PHENERGAN) 25 MG tablet [id number] Yes Take 1 tablet (25 mg total) by mouth 2 (two) times daily as needed for nausea or vomiting. Maximiano Coss, NP Taking Active   sodium chloride (OCEAN) 0.65 % SOLN nasal spray [id number] Yes Place 1 spray into both nostrils as needed  for congestion (nose bleeds). [provider] Taking Active Self  Tiotropium Bromide-Olodaterol (STIOLTO RESPIMAT) 2.5-2.5 MCG/ACT AERS [id number] Yes Inhale 2 puffs into the lungs daily. Freddi Starr, MD Taking Active   valACYclovir (VALTREX) 500 MG tablet [id number] Yes Take 500 mg by mouth daily as needed (breakouts).  [provider] Taking Active Self            Patient Active Problem List   Diagnosis Date Noted   Nodule of right lung 02/06/2022   Closed fracture of left patella with routine healing 11/21/2021   Closed fracture of lateral portion of left tibial plateau 11/07/2021   Tachycardia 10/17/2021   HTN (hypertension) 06/20/2021   Visit for wound check 04/29/2021   Therapeutic opioid-induced constipation (OIC) 04/03/2021   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   Atrial fibrillation ([place]) 02/09/2020   Long term (current) use of anticoagulants 02/09/2020   Acute blood loss anemia 02/09/2020   Debility 02/02/2020   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/2020   Severe mitral regurgitation    Mitral regurgitation due to cusp prolapse    Ankle swelling, left 10/20/2019   Ankle swelling, right 10/20/2019   Secondary hypercoagulable state ([place]) 08/19/2019   Left inguinal hernia 03/11/2019   Visit for monitoring Tikosyn therapy 03/29/2018   Osteoporosis 09/23/2017   Fatigue 02/18/2017   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   Senile osteoporosis 08/01/2016   Physical exam 04/21/2016   Positional lightheadedness 11/16/2015   B12 deficiency 10/01/2015   Hearing loss due to cerumen impaction 08/23/2015   Foot pain, right 05/23/2015   Severe protein-calorie malnutrition ([place]) 10/19/2014   Fracture of multiple ribs 08/07/2014   Shortness of breath 08/06/2014   Constipation 07/14/2014   Peripheral neuropathy 07/11/2014   Vitamin B12 deficiency anemia, unspecified 06/19/2014  Hypokalemia 06/19/2014   Other chronic pain  06/19/2014   Polyneuropathy, unspecified 06/19/2014   Zoster without complications 60/73/7106   Vitreous degeneration, bilateral 06/19/2014   Nutritional deficiency, unspecified 06/19/2014   Nocturia 26/94/8546   Other symbolic dysfunctions 27/09/5007   Unsteadiness on feet 06/19/2014   Unspecified osteoarthritis, unspecified site 06/19/2014   Muscle weakness (generalized) 06/19/2014   Major depressive disorder, single episode, unspecified 06/19/2014   Frequency of micturition 06/19/2014   Encounter for orthopedic aftercare following scoliosis surgery 06/19/2014   Cardiac arrhythmia, unspecified 06/19/2014   Age-related nuclear cataract, unspecified eye 06/19/2014   Hyperlipidemia 06/08/2013   Lumbar back pain 05/19/2012   Ureteric stone 02/18/2012   Leg pain, bilateral 11/17/2011   Hereditary and idiopathic peripheral neuropathy 11/06/2011   S/P cervical spinal fusion 11/06/2011   Gastro-esophageal reflux disease without esophagitis 11/06/2011   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 11/06/2011   Personal history of other diseases of the circulatory system 11/06/2011   Melanoma of skin ([place]) 11/06/2011   History of cardiovascular disorder 11/06/2011   Chronic interstitial cystitis 04/09/2011   Allergic rhinitis 04/25/2010   Vulvodynia, 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   Anxiety and depression 03/04/2010   Malaise and fatigue 02/05/2010   Vitamin D deficiency 01/28/2010   Neoplasm of uncertain behavior of skin 01/25/2010   Disorder of bone and cartilage 01/25/2010   Genital herpes simplex 11/01/2009   Herpes simplex virus (HSV) infection 11/01/2009   Herpetic vulvovaginitis 10/23/2009   Kidney stone 08/09/2009   Urinary tract infection, site not specified 08/09/2009   Brachial neuritis 07/23/2009   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    Immunization History  Administered Date(s) Administered   DTaP 07/07/2009   Influenza Inj Mdck Quad Pf 03/25/2019    Influenza Split 03/07/2012   Influenza, High Dose Seasonal PF 05/02/2016   Influenza, Seasonal, Injecte, Preservative Fre 05/08/2018, 03/25/2019   Influenza,inj,Quad PF,6+ Mos 06/08/2013, 04/06/2015, 05/02/2017, 05/08/2018   Influenza-Unspecified 03/07/2012, 06/08/2013, 04/06/2015, 05/02/2016, 05/02/2017, 05/08/2018, 04/26/2021   PFIZER(Purple Top)SARS-COV-2 Vaccination 08/11/2019, 09/01/2019   Pneumococcal Conjugate-13 04/19/2015   Pneumococcal Polysaccharide-23 08/07/2009, 12/27/2013   Pneumococcal-Unspecified 04/19/2015   Tdap 08/02/2020   Zoster Recombinat (Shingrix) 07/08/2010   Zoster, Live 07/07/2010    Conditions to be addressed/monitored:  Afib, HTN, Hypothyroid  Care Plan : General Pharmacy (Adult)  Updates made by [name], RPH since 03/28/2022 12:00 AM     Problem: HTN, Afib, SOB, Lung nodule   Priority: High  Onset Date: 03/28/2022     Long-Range Goal: Patient-Specific Goal   Start Date: 03/28/2022  Expected End Date: 09/26/2022  This Visit's Progress: On track  Priority: High  Note:   Current Barriers:  Lung nodule and SOB  Pharmacist Clinical Goal(s):  Patient will achieve improvement in breathing symptoms as evidenced by patient reporting through collaboration with PharmD and provider.   Interventions: 1:1 collaboration with [name], MD regarding development and update of comprehensive plan of care as evidenced by provider attestation and co-signature Inter-disciplinary care team collaboration (see longitudinal plan of care) Comprehensive medication review performed; medication list updated in electronic medical record  Hypertension (BP goal <130/80) -Controlled -Current treatment: Metoprolol tartrate 10m twice daily Appropriate, Effective, Safe, Accessible -Medications previously tried: none noted  -Current home readings: no logs discussed, normal for the most part -Current dietary habits: same see previous -Current exercise  habits: same see previous -Denies hypotensive/hypertensive symptoms -Educated on BP goals and benefits of medications for prevention of heart attack, stroke and kidney damage; -Counseled to monitor BP  at home a few times per week, document, and provide log at future appointments -Recommended to continue current medication  Atrial Fibrillation (Goal: prevent stroke and major bleeding) -Controlled -CHADSVASC: 2+ -Current treatment: Rate control: Metoprolol tartrate 54m bid Appropriate, Effective, Safe, Accessible Anticoagulation: Eliquis 529mbid Appropriate, Effective, Safe, Accessible -Medications previously tried: none noted -Home BP and HR readings: controlled in office, borderline low.  No home logs discussed  -Counseled on increased risk of stroke due to Afib and benefits of anticoagulation for stroke prevention; Afib action plan reviewed -Recommended to continue current medication Eliquis dose still appropriate for age, weight, kidney function.  Lung Nodule/SOB (Goal: Improve symptoms) -Query controlled -Current treatment  Stiolto Respimat 2 puffs daily Appropriate, Effective, Safe, Accessible -Medications previously tried: none noted -Recently diagnosed with bronchiectasis through pulmonology.  At this point her next follow up with them is in November.  Encouraged her to reach out to them if she feels she needs to follow up sooner.   -Recommended to continue current medication At this time continue inhalers as prescribed.  No changes at this time.  Please reach out if you need anything!    Patient Goals/Self-Care Activities Patient will:  - take medications as prescribed as evidenced by patient report and record review  Follow Up Plan: The care management team will reach out to the patient again over the next 180 days.       Medication Assistance:  Stiolto obtained through PAP medication assistance program.  Enrollment ends 2023  Compliance/Adherence/Medication fill  history: Care Gaps: None noted  Star-Rating Drugs: None  [facility]:  [facility] [place]hone: [phone]: [phone] [phone]67hone: [phone]: [phone] pill box? Yes Pt endorses 100% compliance  We discussed: Benefits of medication synchronization, packaging and delivery as well as enhanced pharmacist oversight with [employer]. Patient decided to: Continue current medication management strategy  Care Plan and Follow Up Patient Decision:  Patient agrees to Care Plan and Follow-up.  Plan: The care management team will reach out to the patient again over the next 180 days.  ChBeverly MilchPharmD Clinical Pharmacist  [phone]

## 2022-03-19 ENCOUNTER — Encounter: Payer: Self-pay | Admitting: Cardiology

## 2022-03-19 LAB — BASIC METABOLIC PANEL
BUN/Creatinine Ratio: 29 — ABNORMAL HIGH (ref 12–28)
BUN: 23 mg/dL (ref 8–27)
CO2: 26 mmol/L (ref 20–29)
Calcium: 8.9 mg/dL (ref 8.7–10.3)
Chloride: 101 mmol/L (ref 96–106)
Creatinine, Ser: 0.78 mg/dL (ref 0.57–1.00)
Glucose: 93 mg/dL (ref 70–99)
Potassium: 4.5 mmol/L (ref 3.5–5.2)
Sodium: 145 mmol/L — ABNORMAL HIGH (ref 134–144)
eGFR: 78 mL/min/{1.73_m2} (ref >59)

## 2022-03-26 ENCOUNTER — Telehealth: Payer: Medicare Other

## 2022-03-26 ENCOUNTER — Encounter: Payer: Self-pay | Admitting: Family Medicine

## 2022-03-26 ENCOUNTER — Ambulatory Visit (INDEPENDENT_AMBULATORY_CARE_PROVIDER_SITE_OTHER): Payer: Medicare Other | Admitting: Pharmacist

## 2022-03-26 DIAGNOSIS — I10 Essential (primary) hypertension: Secondary | ICD-10-CM

## 2022-03-26 DIAGNOSIS — I480 Paroxysmal atrial fibrillation: Secondary | ICD-10-CM

## 2022-03-27 ENCOUNTER — Encounter: Payer: Self-pay | Admitting: Cardiology

## 2022-03-27 ENCOUNTER — Telehealth: Payer: Self-pay

## 2022-03-27 ENCOUNTER — Telehealth: Payer: Self-pay | Admitting: Cardiology

## 2022-03-27 ENCOUNTER — Encounter: Payer: Self-pay | Admitting: Family Medicine

## 2022-03-27 MED ORDER — CEPHALEXIN 500 MG PO CAPS: ORAL_CAPSULE | ORAL | 2 refills | Status: DC

## 2022-03-27 NOTE — Telephone Encounter (Signed)
Pt called into the office about SBE prophylaxis for upcoming dental cleaning tomorrow. Pt states the DDS sent in clindamycin for her to take prior to cleaning, and she is scared to take this, for she has a long history of antibiotic allergies (clindamycin not one) and we have always in the past called in Keflex, as well as the Dentist has too.  Pt states she voiced this concern to the Dentist and he advised that Clindamycin is stronger than keflex.  When reviewing her past medication history and SBE that we've called in for the pt, we sent in Keflex 2 grams po 1 hour prior to dental work.    Endorsed to the pt that I will place a call to Dr. Johney Frame about this and call her thereafter with further SBE instructions.  Pt verbalized understanding and agrees with this plan.    Called Dr. Johney Frame on the phone about this and per Dr. Johney Frame, ok to call in SBE Prophylaxis for the pt to take Keflex 2 grams po 1 hour prior to dental cleaning/procedure.  Made the pt aware of this.  Confirmed the pharmacy of choice with the pt.  Did inform her that she should make her Dentist aware of this, so that they have this for their records.  Pt verbalized understanding and agrees with this plan.   Discussed the use of prophylactic antibiotics before dental work and other surgeries. Prescription given for Cephalexin or Cephadroxil 2 grams orally one hour before procedure   Advised to write for Keflex 2 grams PO 1 hour prior to dental procedure.   Prescription sent to pharmacy    Pt notified.

## 2022-03-27 NOTE — Telephone Encounter (Signed)
Received care plan on this patient from [facility] they are asking that you review plan of care and sign off  This is in your sign folder

## 2022-03-27 NOTE — Telephone Encounter (Signed)
Pt states she is usually prescribed an antibiotic by Dr. Johney Frame before dental work. She states they are trying to give her clindamycin but she doesn't remember ever taking this one before. Please advise if this is what was prescribed to her in the past or not

## 2022-03-28 ENCOUNTER — Encounter: Payer: Self-pay | Admitting: Pulmonary Disease

## 2022-03-28 DIAGNOSIS — K219 Gastro-esophageal reflux disease without esophagitis: Secondary | ICD-10-CM | POA: Diagnosis not present

## 2022-03-28 DIAGNOSIS — I499 Cardiac arrhythmia, unspecified: Secondary | ICD-10-CM | POA: Diagnosis not present

## 2022-03-28 DIAGNOSIS — G629 Polyneuropathy, unspecified: Secondary | ICD-10-CM | POA: Diagnosis not present

## 2022-03-28 DIAGNOSIS — I10 Essential (primary) hypertension: Secondary | ICD-10-CM | POA: Diagnosis not present

## 2022-03-28 DIAGNOSIS — E039 Hypothyroidism, unspecified: Secondary | ICD-10-CM | POA: Diagnosis not present

## 2022-03-28 DIAGNOSIS — N301 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: Secondary | ICD-10-CM | POA: Diagnosis not present

## 2022-03-28 DIAGNOSIS — R911 Solitary pulmonary nodule: Secondary | ICD-10-CM | POA: Diagnosis not present

## 2022-03-28 DIAGNOSIS — E43 Unspecified severe protein-calorie malnutrition: Secondary | ICD-10-CM | POA: Diagnosis not present

## 2022-03-28 DIAGNOSIS — M810 Age-related osteoporosis without current pathological fracture: Secondary | ICD-10-CM | POA: Diagnosis not present

## 2022-03-28 NOTE — Patient Instructions (Addendum)
Visit Information   Goals Addressed             This Visit's Progress    Track and Manage My Blood Pressure-Hypertension   On track    Timeframe:  Long-Range Goal Priority:  High Start Date: 03/28/22                            Expected End Date:  09/25/21                     Follow Up Date 06/27/22    - check blood pressure 3 times per week    Why is this important?   You won't feel high blood pressure, but it can still hurt your blood vessels.  High blood pressure can cause heart or kidney problems. It can also cause a stroke.  Making lifestyle changes like losing a little weight or eating less salt will help.  Checking your blood pressure at home and at different times of the day can help to control blood pressure.  If the doctor prescribes medicine remember to take it the way the doctor ordered.  Call the office if you cannot afford the medicine or if there are questions about it.     Notes:        Patient Care Plan: General Plan of Care (Adult)     Problem Identified: Therapeutic Alliance (General Plan of Care) Resolved 08/09/2020  Priority: Medium  Onset Date: 07/02/2020  Note:   08/09/20 pt transferred to [facility] embedded program Sylvie Farrier, [facility] RN CM     Goal: Therapeutic Alliance Established Completed 08/09/2020  Start Date: 07/02/2020  Expected End Date: 12/04/2020  This Visit's Progress: On track  Recent Progress: On track  Priority: Medium  Note:   Evidence-based guidance:  Avoid value judgments; convey acceptance.  Encourage collaboration with the treatment team.  Establish rapport; develop trust relationship.  Therapist, art.  Provide emotional support; encourage patient to share feelings of anger, fear and anxiety.  Promote self-reliance and autonomy based on age and ability; discourage overprotection.  Use empathy and nonjudgmental, participatory manner.   Notes:     Task: Develop Relationship to Effect Behavior Change Completed 08/09/2020  Due  Date: 12/04/2020  Outcome: Positive  Responsible User: Barbaraann Faster, RN  Note:   Care Management Activities:    - acceptance conveyed - care explained - choices provided - collaboration with team encouraged - emotional support provided - empathic listening provided - questions answered - questions encouraged - rapport fostered - reassurance provided - self-reliance encouraged - verbalization of feelings encouraged    Notes:     Problem Identified: Health Promotion or Disease Self-Management (General Plan of Care) Resolved 08/09/2020  Priority: High  Onset Date: 07/02/2020  Note:   08/09/20 pt transferred to [facility] embedded program Sylvie Farrier, [facility] RN CM     Goal: Self-Management Plan Developed Completed 08/09/2020  Start Date: 07/02/2020  Expected End Date: 08/09/2020  This Visit's Progress: On track  Recent Progress: On track  Priority: High  Note:   Evidence-based guidance:  Review biopsychosocial determinants of health screens.  Determine level of modifiable health risk.  Assess level of patient activation, level of readiness, importance and confidence to make changes.  Evoke change talk using open-ended questions, pros and cons, as well as looking forward.  Identify areas where behavior change may lead to improved health.  Partner with patient to develop a robust  self-management plan that includes lifestyle factors, such as weight loss, exercise and healthy nutrition, as well as goals specific to disease risks.  Support patient and family/caregiver active participation in decision-making and self-management plan.  Implement additional goals and interventions based on identified risk factors to reduce health risk.  Facilitate advance care planning.  Review need for preventive screening based on age, sex, family history and health history.   Notes:     Task: Mutually Develop and Foster Achievement of Patient Goals Completed 08/09/2020  Due Date: 12/04/2020  Outcome: Positive   Responsible User: Barbaraann Faster, RN  Note:   Care Management Activities:    - choices provided - collaboration with team encouraged - decision-making supported - health risks reviewed - problem-solving facilitated - questions answered - readiness for change evaluated - reassurance provided - self-reliance encouraged - verbalization of feelings encouraged    Notes:     Problem Identified: Coping Skills (General Plan of Care) Resolved 08/09/2020  Priority: Medium  Onset Date: 07/02/2020     Long-Range Goal: Coping Skills Enhanced Completed 08/09/2020  Start Date: 07/02/2020  Expected End Date: 08/09/2020  This Visit's Progress: On track  Recent Progress: On track  Priority: Medium  Note:   Evidence-based guidance:  Acknowledge, normalize and validate difficulty of making life-long lifestyle changes.  Identify current effective and ineffective coping strategies.  Encourage patient and caregiver participation in care to increase self-esteem, confidence and feelings of control.  Consider alternative and complementary therapy approaches such as meditation, mindfulness or yoga.  Encourage participation in cognitive behavioral therapy to foster a positive identity, increase self-awareness, as well as bolster self-esteem, confidence and self-efficacy.  Discuss spirituality; be present as concerns are identified; encourage journaling, prayer, worship services, meditation or pastoral counseling.  Encourage participation in pleasurable group activities such as hobbies, singing, sports or volunteering).  Encourage the use of mindfulness; refer for training or intensive intervention.  Consider the use of meditative movement therapy such as tai chi, yoga or qigong.  Promote a regular daily exercise program based on tolerance, ability and patient choice to support positive thinking about disease or aging.   Notes:     Task: Support Psychosocial Response to Risk or Actual Health Condition  Completed 08/09/2020  Due Date: 08/09/2020  Outcome: Positive  Responsible User: Barbaraann Faster, RN  Note:   Care Management Activities:   08/09/20 pt transferred to [facility] embedded program Sylvie Farrier, [facility] RN CM  - active listening utilized - counseling provided - current coping strategies identified - decision-making supported - healthy lifestyle promoted - verbalization of feelings encouraged    Notes:     Patient Care Plan: Atrial Fibrillation (Adult)     Problem Identified: Dysrhythmia (Atrial Fibrillation) Resolved 08/09/2020  Priority: High  Onset Date: 07/02/2020     Long-Range Goal: Heart Rate and Rhythm Monitored and Managed Completed 08/09/2020  Start Date: 07/02/2020  Expected End Date: 08/09/2020  This Visit's Progress: On track  Recent Progress: On track  Priority: High  Note:   Evidence-based guidance:  Assess heart rate, rhythm and presence of symptoms at each encounter using pulse palpation, blood pressure monitor and review of symptom diary.  Consider pulse check plus an electrocardiogram (ECG) in women over the age of 36 years.  Prepare patient for diagnostic studies, such as 24-hour ambulatory monitoring, echocardiogram or implantable loop recorder, based on presentation and risk factors, such as cryptogenic stroke.  Prepare patient for use of pharmacologic therapy, such as beta-blocker, calcium channel blocker,  digoxin, antiarrhythmic, based on presentation, risk factors and patient preferences.  Monitor side effects and expect periodic adjustments.  Provide an opportunity for shared decision-making when uncontrolled rate and rhythm persist and invasive therapy is considered, such as left atrial ablation, pacemaker, cardioversion or cardiac resynchronization therapy.  Provide reassurance, as initial symptoms and potential recurrence are frightening to patient and family/caregiver and may impact quality of life.  Provide prompt follow-up after hospitalization to support  transition of care; consider referral to home care or community support program, especially in presence of comorbidity.  Encourage exercise 2 to 3 times per week, based on ability and tolerance, to improve physical and cardiac function and quality of life.   Notes:     Task: Alleviate Barriers to Dysrhythmia Management Completed 08/09/2020  Due Date: 12/04/2020  Outcome: Positive  Responsible User: Barbaraann Faster, RN  Note:   Care Management Activities:   08/09/20 pt transferred to [facility] embedded program Sylvie Farrier, [facility] RN CM  - activity or exercise based on tolerance encouraged - medication-adherence assessment completed - pulse rate and rhythm assessed - reassurance provided - response to pharmacologic therapy monitored - screen for functional limitations reviewed - rescue (action) plan use encouraged    Notes:     Patient Care Plan: General Pharmacy (Adult)     Problem Identified: HTN, Afib, SOB, Lung nodule   Priority: High  Onset Date: 03/28/2022     Long-Range Goal: Patient-Specific Goal   Start Date: 03/28/2022  Expected End Date: 09/26/2022  This Visit's Progress: On track  Priority: High  Note:   Current Barriers:  Lung nodule and SOB  Pharmacist Clinical Goal(s):  Patient will achieve improvement in breathing symptoms as evidenced by patient reporting through collaboration with PharmD and provider.   Interventions: 1:1 collaboration with [name], MD regarding development and update of comprehensive plan of care as evidenced by provider attestation and co-signature Inter-disciplinary care team collaboration (see longitudinal plan of care) Comprehensive medication review performed; medication list updated in electronic medical record  Hypertension (BP goal <130/80) -Controlled -Current treatment: Metoprolol tartrate 29m twice daily Appropriate, Effective, Safe, Accessible -Medications previously tried: none noted  -Current home readings: no logs  discussed, normal for the most part -Current dietary habits: same see previous -Current exercise habits: same see previous -Denies hypotensive/hypertensive symptoms -Educated on BP goals and benefits of medications for prevention of heart attack, stroke and kidney damage; -Counseled to monitor BP at home a few times per week, document, and provide log at future appointments -Recommended to continue current medication  Atrial Fibrillation (Goal: prevent stroke and major bleeding) -Controlled -CHADSVASC: 2+ -Current treatment: Rate control: Metoprolol tartrate 533mbid Appropriate, Effective, Safe, Accessible Anticoagulation: Eliquis 54m37mid Appropriate, Effective, Safe, Accessible -Medications previously tried: none noted -Home BP and HR readings: controlled in office, borderline low.  No home logs discussed  -Counseled on increased risk of stroke due to Afib and benefits of anticoagulation for stroke prevention; Afib action plan reviewed -Recommended to continue current medication Eliquis dose still appropriate for age, weight, kidney function.  Lung Nodule/SOB (Goal: Improve symptoms) -Query controlled -Current treatment  Stiolto Respimat 2 puffs daily Appropriate, Effective, Safe, Accessible -Medications previously tried: none noted -Recently diagnosed with bronchiectasis through pulmonology.  At this point her next follow up with them is in November.  Encouraged her to reach out to them if she feels she needs to follow up sooner.   -Recommended to continue current medication At this time continue inhalers as prescribed.  No changes at this time.  Please reach out if you need anything!    Patient Goals/Self-Care Activities Patient will:  - take medications as prescribed as evidenced by patient report and record review  Follow Up Plan: The care management team will reach out to the patient again over the next 180 days.       The patient verbalized understanding of instructions,  educational materials, and care plan provided today and DECLINED offer to receive copy of patient instructions, educational materials, and care plan.  Telephone follow up appointment with pharmacy team member scheduled for: 6 months  [name], [place], PharmD Clinical Pharmacist  [facility] [phone]

## 2022-03-28 NOTE — Telephone Encounter (Signed)
Forms have been faxed to [place] . Copy sent to [facility] and copies placed in scan file .

## 2022-03-28 NOTE — Telephone Encounter (Signed)
This was signed and returned to [facility] desk

## 2022-03-31 NOTE — Assessment & Plan Note (Signed)
Deteriorated.  Pt is anxious and overwhelmed about almost everything at this point.  This has always been one of her biggest challenges and has never been able to 'tolerate' medication for it w/ the exception of Clonazepam.  She states she is willing to try a medication as long as it doesn't interact w/ her current meds or has been problematic in the past.  Will need to do a deep dive in her chart to see what that leaves [place] with.  Again encouraged her to consider counseling.  Will continue to follow.

## 2022-03-31 NOTE — Assessment & Plan Note (Signed)
Ongoing issue for pt.  Encouraged her to proceed w/ Prolia as she has no indication of osteonecrosis.

## 2022-03-31 NOTE — Assessment & Plan Note (Signed)
Chronic problem.  Currently on Euthyrox 33mg daily.  She is complaining of fatigue so will check labs to determine if medication adjustments need to be made.  Pt expressed understanding and is in agreement w/ plan.

## 2022-04-02 ENCOUNTER — Ambulatory Visit (INDEPENDENT_AMBULATORY_CARE_PROVIDER_SITE_OTHER): Payer: Medicare Other | Admitting: Pulmonary Disease

## 2022-04-02 ENCOUNTER — Encounter: Payer: Self-pay | Admitting: Pulmonary Disease

## 2022-04-02 VITALS — BP 116/70 | HR 62 | Ht 66.0 in | Wt 120.0 lb

## 2022-04-02 DIAGNOSIS — R0602 Shortness of breath: Secondary | ICD-10-CM

## 2022-04-02 DIAGNOSIS — J479 Bronchiectasis, uncomplicated: Secondary | ICD-10-CM | POA: Diagnosis not present

## 2022-04-02 DIAGNOSIS — K219 Gastro-esophageal reflux disease without esophagitis: Secondary | ICD-10-CM | POA: Diagnosis not present

## 2022-04-02 NOTE — Patient Instructions (Addendum)
Continue pantoprazole '40mg'$  daily for reflux Continue as needed TUMS  You do not have cancer in your lungs  There is mild bronchiectasis that is stable compared to CT chest scans in 2021.   Your swallow evaluation is normal  Recommend physical therapy and looking into medical massages for musculoskeletal pain control  Follow up in 1 year

## 2022-04-02 NOTE — Progress Notes (Signed)
Synopsis: Referred in August 2023 for abnormal CT Chest scan  Subjective:   PATIENT ID: [name] GENDER: female DOB: [birth year], MRN: [id number]   HPI  Chief Complaint  Patient presents with   Follow-up    1 mo f/u for SOB and "lung pain".    [name] is a 76 year old woman with history of mitral regurgitation s/p minimally invassive MV repair and MAZE procedure complicated by LV free wall rupture 10/2019 and scoliosis with 7 spinal surgeries in 2015 who returns to pulmonary clinic for chest pains and shortness of breath.   She did not tolerate stiolto after last visit as she reports choking issues after using it.  She had modified barium swallow 03/11/22 with no overt issues of dysphagia or aspiration risk. She reported taking TUMS to the speech therapist for chest pains with relief. She did start pantoprazole after last visit which she reports some improvement in her reflux.  She continues to have significant chest, neck, shoulder and back pains.   She is meeting with her surgical team at [place] and is considering botox injections in her neck.  OV 03/05/22 She reports the dyspnea can occur at rest or with exertion. She has wheezing at night sometimes. She complains of sharp chest pains that are located centrally and goes between her shoulder blades. She has terrible heartburn. She does report trouble swallowing her food/drink. She is using oxycodone every 4 hours for chronic pain due to her neck and back. She has cervical, thoracic and lumbar spine hardware in place.   PFTs 2022 showed normal spirometry but evidence of air trapping and significant bronchodilator response.   She has not noted much benefit from trying albuterol inhaler with her shortness of breath.   She is a former smoker, quit in 1980. She lives alone and is accompanied by her sister today. She is having more difficulty with her ADLs.   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[place]) ~ 2003   "right"   Mitral regurgitation    Osteopenia 02/18/2012   Osteoporosis    PAF (paroxysmal atrial fibrillation) ([place]) 2012   Peripheral neuropathy 11/06/2011   PMDD (premenstrual dysphoric disorder)    PONV (postoperative nausea and vomiting)    nausea, vomiting, hives and dizziness    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/2020   Complete bilateral atrial lesion set using cryothermy and bipolar radiofrequency ablation with clipping of LA appendage via right mini-thoracotomy approach   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   Complex valvuloplasty including artificial Gore-tex neochord placement x12 with 63m Sorin Memo 4D ring annuloplasty   Seasonal allergies    Vaginal delivery    ONE NSVD   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     Family History  Problem Relation Age of Onset   Diabetes Father    Hyperlipidemia Sister    Heart disease Sister    Stroke  Sister    Diabetes Brother    Hyperlipidemia Sister    Heart disease Sister    Arthritis Mother    Heart disease Mother    Uterine cancer Mother    Diabetes Brother    Heart Problems Brother      Social History   Socioeconomic History   Marital status: Divorced    Spouse name: none/divorced   Number of children: Not on file   Years of education: Not on file   Highest education level: 12th grade  Occupational History   Occupation: retired  Tobacco Use   Smoking status: Former    Packs/day: 0.10    Years: 14.00    Total pack years: 1.40     Types: Cigarettes    Quit date: 1980    Years since quitting: 43.7   Smokeless tobacco: Never   Tobacco comments:    03/31/2018 "quit ~ 1980; someday smoker when I did smoke; never addicted"  Vaping Use   Vaping Use: Never used  Substance and Sexual Activity   Alcohol use: Never   Drug use: Yes    Types: Oxycodone, Benzodiazepines    Comment: 03/31/2018 "for chronic neck and back pain", takes Klonopin at times.    Sexual activity: Not Currently  Other Topics Concern   Not on file  Social History Narrative   Lives by herself.    Social Determinants of Health   Financial Resource Strain: Low Risk  (10/10/2021)   Overall Financial Resource Strain (CARDIA)    Difficulty of Paying Living Expenses: Not hard at all  Food Insecurity: No Food Insecurity (10/10/2021)   Hunger Vital Sign    Worried About Running Out of Food in the Last Year: Never true    Ran Out of Food in the Last Year: Never true  Transportation Needs: No Transportation Needs (10/10/2021)   PRAPARE - Hydrologist (Medical): No    Lack of Transportation (Non-Medical): No  Physical Activity: Inactive (10/10/2021)   Exercise Vital Sign    Days of Exercise per Week: 0 days    Minutes of Exercise per Session: 0 min  Stress: No Stress Concern Present (10/10/2021)   [place]    Feeling of Stress : Not at all  Social Connections: Socially Isolated (10/10/2021)   Social Connection and Isolation Panel [NHANES]    Frequency of Communication with Friends and Family: Twice a week    Frequency of Social Gatherings with Friends and Family: Twice a week    Attends Religious Services: Never    Marine scientist or Organizations: No    Attends Archivist Meetings: Never    Marital Status: Divorced  Human resources officer Violence: Not At Risk (10/10/2021)   Humiliation, Afraid, Rape, and Kick questionnaire    Fear of Current or Ex-Partner:  No    Emotionally Abused: No    Physically Abused: No    Sexually Abused: No     Allergies  Allergen Reactions   Buprenorphine Nausea Only and Other (See Comments)    sedation and adhesive reaction Other reaction(s): nausea, sedation and adhesive reaction Other reaction(s): nausea, sedation and adhesive reaction, Other (See Comments), Other (See Comments) sedation and adhesive reaction Other reaction(s): nausea, sedation and adhesive reaction sedation and adhesive reaction Other reaction(s): nausea, sedation and adhesive reaction sedation and adhesive reaction Other reaction(s): nausea, sedation and adhesive reaction sedation and adhesive reaction Other reaction(s): nausea, sedation and adhesive  reaction sedation and adhesive reaction Other reaction(s): nausea, sedation and adhesive reaction sedation and adhesive reaction Other reaction(s): nausea, sedation and adhesive reaction   Erythromycin Nausea And Vomiting and Other (See Comments)    Dizziness  Other reaction(s): Other (See Comments) Dizziness  unknown unknown Dizziness  BURNING ALL OVER  BURNING ALL OVER  unknown Dizziness  BURNING ALL OVER    Iodinated Contrast Media Hives, Nausea Only and Rash    unknown unknown unknown   Latex Palpitations, Other (See Comments) and Rash     hives and sores Other reaction(s): hives and sores Other reaction(s):  hives and sores, Other (See Comments), Other (See Comments)  hives and sores Other reaction(s): hives and sores hives hives  hives and sores Other reaction(s): hives and sores hives Sores  hives and sores Other reaction(s): hives and sores hives Sores  hives and sores Other reaction(s): hives and sores hives  hives and sores Other reaction(s): hives and sores hives Sores  hives and sores Other reaction(s): hives and sores   Other Other (See Comments)    CAN NOT TAKE , aLPRAZOLAM, OR ELAVIL DUE TO AFIB FOR ANXIETY  IV CONTRAST /"DYE". Other  reaction(s): hives and sores Other reaction(s): Other (See Comments), Other (See Comments) CAN NOT TAKE , aLPRAZOLAM, OR ELAVIL DUE TO AFIB FOR ANXIETY  IV CONTRAST /"DYE". Other reaction(s): hives and sores IV CONTRAST /"DYE". CAN NOT TAKE , aLPRAZOLAM, OR ELAVIL DUE TO AFIB FOR ANXIETY  IV CONTRAST /"DYE". Other reaction(s): hives and sores Patient states that every time she has anesthesia, she wakes up vomiting, has chills, blood pressure drops CAN NOT TAKE , aLPRAZOLAM, OR ELAVIL DUE TO AFIB FOR ANXIETY  IV CONTRAST /"DYE". Other reaction(s): hives and sores IV CONTRAST /"DYE". Patient states that every time she has anesthesia, she wakes up vomiting, has chills, blood pressure drops CAN NOT TAKE , aLPRAZOLAM, OR ELAVIL DUE TO AFIB FOR ANXIETY  IV CONTRAST /"DYE". Other reaction(s): hives and sores IV CONTRAST /"DYE". CAN NOT TAKE , aLPRAZOLAM, OR ELAVIL DUE TO AFIB FOR ANXIETY  IV CONTRAST /"DYE". Other reaction(s): hives and sores Patient states that every time she has anesthesia, she wakes up vomiting, has chills, blood pressure drops CAN NOT TAKE , aLPRAZOLAM, OR ELAVIL DUE TO AFIB FOR ANXIETY  IV CONTRAST /"DYE". Other reaction(s): hives and sores   Tape Rash, Other (See Comments) and Itching    Heart monitor stickers must be rotated in order to prevent rash Other reaction(s): hives and sores Adhesive on EKG tabs=BURNS SKIN**PAPER TAPE OK** sores Adhesive on EKG tabs. Per Pt, Ok w/Paper Tape Adhesive on EKG tabs=BURNS SKIN**PAPER TAPE OK** sores Other reaction(s): Other (See Comments) Heart monitor stickers must be rotated in order to prevent rash Adhesive on EKG tabs=BURNS SKIN**PAPER TAPE OK** sores   Ciprofloxacin Other (See Comments)    Other reaction(s): Other (See Comments) Burning all over Burning all over Burning all over   Duloxetine Hcl     Severe diarrhea and upset stomach Other reaction(s): Other (See Comments), Unknown Severe diarrhea and  upset stomach Severe diarrhea and upset stomach Severe diarrhea and upset stomach   Erythromycin Base     Other reaction(s): Other (See Comments), Unknown   Hydrocodone Other (See Comments)    Sedation,dizziness, and nausea Other reaction(s): sedation and nausea Other reaction(s): sedation and nausea Other reaction(s): Other (See Comments), Other (See Comments), sedation and nausea Sedation,dizziness, and nausea Other reaction(s): sedation and nausea Other reaction(s): sedation and nausea Sedation,dizziness, and nausea Other  reaction(s): sedation and nausea Other reaction(s): sedation and nausea Sedation,dizziness, and nausea Other reaction(s): sedation and nausea Other reaction(s): sedation and nausea Sedation,dizziness, and nausea Other reaction(s): sedation and nausea Other reaction(s): sedation and nausea Sedation,dizziness, and nausea Other reaction(s): sedation and nausea Other reaction(s): sedation and nausea Sedation,dizziness, and nausea Other reaction(s): sedation and nausea Other reaction(s): sedation and nausea   Hydromorphone Other (See Comments)    Sedation,dizziness, and nausea Other reaction(s): sedation and nausea Other reaction(s): sedation and nausea Other reaction(s): Other (See Comments), Other (See Comments), sedation and nausea Sedation,dizziness, and nausea Other reaction(s): sedation and nausea Other reaction(s): sedation and nausea Sedation,dizziness, and nausea Other reaction(s): sedation and nausea Other reaction(s): sedation and nausea Sedation,dizziness, and nausea Other reaction(s): sedation and nausea Other reaction(s): sedation and nausea Sedation,dizziness, and nausea Other reaction(s): sedation and nausea Other reaction(s): sedation and nausea Sedation,dizziness, and nausea Other reaction(s): sedation and nausea Other reaction(s): sedation and nausea Sedation,dizziness, and nausea Other reaction(s): sedation and nausea Other  reaction(s): sedation and nausea   Iodine     unknown Other reaction(s): Other (See Comments) unknown unknown unknown unknown unknown unknown   Levofloxacin Nausea And Vomiting and Nausea Only   Metrizamide Hives   Nsaids     CANNOT TAKE PER CARDIOLOGIST DUE TO AFIB    Nucynta [Tapentadol Hcl]     nausea and sedation   Oxycodone Other (See Comments)    Delusions (intolerance)  PILLS ONLY sedation, dizziness, nausea,  and itching Other reaction(s): sedation and nausea Other reaction(s): sedation and nausea Other reaction(s): Hallucinations, Other Other reaction(s): Other (See Comments), sedation and nausea Delusions (intolerance)  PILLS ONLY sedation, dizziness, nausea,  and itching Other reaction(s): sedation and nausea Other reaction(s): sedation and nausea Delusions (intolerance)  PILLS ONLY sedation, dizziness, nausea,  and itching Other reaction(s): sedation and nausea Other reaction(s): sedation and nausea Delusions (intolerance)  PILLS ONLY sedation, dizziness, nausea,  and itching Other reaction(s): sedation and nausea Other reaction(s): sedation and nausea Delusions (intolerance)  PILLS ONLY sedation, dizziness, nausea,  and itching Other reaction(s): sedation and nausea Other reaction(s): sedation and nausea Delusions (intolerance)  PILLS ONLY sedation, dizziness, nausea,  and itching Other reaction(s): sedation and nausea Other reaction(s): sedation and nausea Delusions (intolerance)  PILLS ONLY sedation, dizziness, nausea,  and itching Other reaction(s): sedation and nausea Other reaction(s): sedation and nausea   Pentazocine Other (See Comments)    Unknown Other reaction(s): Unknown Other reaction(s): Unknown Other reaction(s): Other (See Comments), Other (See Comments), Unknown Unknown Other reaction(s): Unknown Other reaction(s): Unknown Unknown Other reaction(s): Unknown Other reaction(s): Unknown Unknown Other reaction(s): Unknown Other  reaction(s): Unknown Unknown Other reaction(s): Unknown Other reaction(s): Unknown Unknown Other reaction(s): Unknown Other reaction(s): Unknown Unknown Other reaction(s): Unknown Other reaction(s): Unknown   Septra [Bactrim]     Hives    Sulfasalazine Hives   Morphine Anxiety, Other (See Comments) and Nausea Only    sedation and nausea Other reaction(s): sedation and nausea Other reaction(s): sedation and nausea Other reaction(s): Delusions (intolerance), Other (See Comments), Other (See Comments), sedation and nausea sedation and nausea Other reaction(s): sedation and nausea Other reaction(s): sedation and nausea sedation and nausea Other reaction(s): sedation and nausea Other reaction(s): sedation and nausea PT CANNOT WAKE UP AFTER TAKING MEDICATION AND HAS NIGHTMARES  sedation and nausea Other reaction(s): sedation and nausea Other reaction(s): sedation and nausea PT CANNOT WAKE UP AFTER TAKING MEDICATION AND HAS NIGHTMARES  sedation and nausea Other reaction(s): sedation and nausea Other reaction(s): sedation and nausea sedation and nausea Other reaction(s):  sedation and nausea Other reaction(s): sedation and nausea PT CANNOT WAKE UP AFTER TAKING MEDICATION AND HAS NIGHTMARES  sedation and nausea Other reaction(s): sedation and nausea Other reaction(s): sedation and nausea   Sulfa Antibiotics Hives, Nausea Only and Rash    rash rash rash   Sulfamethoxazole-Trimethoprim Rash    Hives  Hives  Hives  Hives  Hives  Hives    Tapentadol Nausea Only    Other reaction(s): nausea and sedation, Other (See Comments) nausea and sedation nausea and sedation     Outpatient Medications Prior to Visit  Medication Sig Dispense Refill   acetaminophen (TYLENOL) 500 MG tablet Take 500 mg by mouth every 6 (six) hours as needed for moderate pain.      apixaban (ELIQUIS) 5 MG TABS tablet Take 1 tablet (5 mg total) by mouth 2 (two) times daily. 180 tablet 3   b complex  vitamins tablet Take 1 tablet by mouth daily.     Calcium Carbonate Antacid (TUMS CHEWY BITES PO) Take 1 tablet by mouth daily as needed (reflux).      Calcium Citrate-Vitamin D (CALCIUM + D PO) Take 1 tablet by mouth daily.     Carboxymethylcellul-Glycerin (LUBRICATING EYE DROPS OP) Place 1 drop into both eyes daily as needed (dry eyes).     cephALEXin (KEFLEX) 500 MG capsule Take 2 grams (4 tablets) by mouth 1 hour prior to dental cleaning/procedure. 4 capsule 2   clonazePAM (KLONOPIN) 0.5 MG tablet Take 1 tablet by mouth twice daily as needed for anxiety 30 tablet 3   cyclobenzaprine (FLEXERIL) 10 MG tablet Take 10 mg by mouth at bedtime as needed for muscle spasms.   5   denosumab (PROLIA) 60 MG/ML SOSY injection Inject 60 mg into the skin every 6 (six) months.     furosemide (LASIX) 20 MG tablet Take on Tue-Thur-Sat 36 tablet 3   furosemide (LASIX) 40 MG tablet Take on M-W-F- Sun 48 tablet 3   levothyroxine (SYNTHROID) 75 MCG tablet Take 1 tablet by mouth once daily 90 tablet 0   meclizine (ANTIVERT) 25 MG tablet TAKE 1 TABLET BY MOUTH THREE TIMES DAILY AS NEEDED FOR DIZZINESS 30 tablet 0   metoprolol tartrate (LOPRESSOR) 50 MG tablet Take 1 tablet (50 mg total) by mouth 2 (two) times daily. 180 tablet 3   Nutritional Supplements (,FEEDING SUPPLEMENT, PROSOURCE PLUS) liquid Take 30 mLs by mouth 2 (two) times daily between meals. 887 mL 0   nystatin (MYCOSTATIN/NYSTOP) powder Apply topically 2 (two) times daily as needed (skin irritation (under breasts)). 15 g 0   ondansetron (ZOFRAN) 4 MG tablet Take 1 tablet (4 mg total) by mouth every 8 (eight) hours as needed. 30 tablet 0   oxyCODONE (ROXICODONE) 5 MG/5ML solution Take 4 mLs (4 mg total) by mouth every 4 (four) hours as needed for moderate pain.  0   pantoprazole (PROTONIX) 40 MG tablet Take 1 tablet (40 mg total) by mouth daily. 30 tablet 3   polyethylene glycol (MIRALAX / GLYCOLAX) packet Take 17 g by mouth daily as needed for mild  constipation.     potassium chloride (KLOR-CON) 10 MEQ tablet Take 2 tablets by mouth daily for one week only. 14 tablet 0   promethazine (PHENERGAN) 25 MG tablet Take 1 tablet (25 mg total) by mouth 2 (two) times daily as needed for nausea or vomiting. 20 tablet 0   sodium chloride (OCEAN) 0.65 % SOLN nasal spray Place 1 spray into both nostrils as needed for congestion (  nose bleeds).     valACYclovir (VALTREX) 500 MG tablet Take 500 mg by mouth daily as needed (breakouts).      Tiotropium Bromide-Olodaterol (STIOLTO RESPIMAT) 2.5-2.5 MCG/ACT AERS Inhale 2 puffs into the lungs daily. 2 each 0   No facility-administered medications prior to visit.   Review of Systems  Constitutional:  Negative for chills, fever, malaise/fatigue and weight loss.  HENT:  Negative for congestion, sinus pain and sore throat.   Eyes: Negative.   Respiratory:  Positive for shortness of breath. Negative for cough, hemoptysis, sputum production and wheezing.   Cardiovascular:  Positive for chest pain. Negative for palpitations, orthopnea, claudication and leg swelling.  Gastrointestinal:  Positive for heartburn. Negative for abdominal pain, nausea and vomiting.  Genitourinary: Negative.   Musculoskeletal:  Negative for joint pain and myalgias.  Skin:  Negative for rash.  Neurological:  Negative for weakness.  Endo/Heme/Allergies: Negative.   Psychiatric/Behavioral:  The patient is nervous/anxious.    Objective:   Vitals:   04/02/22 1106  BP: 116/70  Pulse: 62  SpO2: 98%  Weight: 120 lb (54.4 kg)  Height: '5\' 6"'$  (1.676 m)   Physical Exam Constitutional:      General: She is not in acute distress.    Appearance: She is not ill-appearing.  HENT:     Head: Normocephalic and atraumatic.  Eyes:     General: No scleral icterus. Cardiovascular:     Rate and Rhythm: Normal rate and regular rhythm.     Pulses: Normal pulses.     Heart sounds: Normal heart sounds. No murmur heard. Pulmonary:     Effort:  Pulmonary effort is normal.     Breath sounds: Normal breath sounds. No wheezing, rhonchi or rales.  Musculoskeletal:     Right lower leg: No edema.     Left lower leg: No edema.  Skin:    General: Skin is warm and dry.  Neurological:     General: No focal deficit present.     Mental Status: She is alert.  Psychiatric:        Mood and Affect: Mood normal.        Behavior: Behavior normal.        Thought Content: Thought content normal.        Judgment: Judgment normal.    CBC    Component Value Date/Time   WBC 6.6 10/31/2021 1436   RBC 4.52 10/31/2021 1436   HGB 13.4 10/31/2021 1436   HGB 12.4 11/24/2019 1129   HCT 40.6 10/31/2021 1436   HCT 36.5 11/24/2019 1129   PLT 187.0 10/31/2021 1436   PLT 175 11/24/2019 1129   MCV 89.9 10/31/2021 1436   MCV 90 11/24/2019 1129   MCH 25.9 (L) 02/06/2020 0554   MCHC 33.0 10/31/2021 1436   RDW 14.3 10/31/2021 1436   RDW 12.4 11/24/2019 1129   LYMPHSABS 1.5 10/31/2021 1436   MONOABS 0.5 10/31/2021 1436   EOSABS 0.1 10/31/2021 1436   BASOSABS 0.0 10/31/2021 1436      Latest Ref Rng & Units 03/18/2022    1:14 PM 03/03/2022    2:03 PM 02/21/2022    1:43 PM  BMP  Glucose 70 - 99 mg/dL 93  87  98   BUN 8 - 27 mg/dL '23  26  27   '$ Creatinine 0.57 - 1.00 mg/dL 0.78  1.11  0.93   BUN/Creat Ratio 12 - '28 29  23  29   '$ Sodium 134 - 144 mmol/L 145  147  142   Potassium 3.5 - 5.2 mmol/L 4.5  5.2  4.1   Chloride 96 - 106 mmol/L 101  93  98   CO2 20 - 29 mmol/L 26  33  28   Calcium 8.7 - 10.3 mg/dL 8.9  9.7  9.6    Chest imaging: CT Chest 02/13/22 1. Right middle lobe and left upper lobe tree-in-bud nodularity consistent with atypical infection. 2. Interval development of a 6x4 mm subpleural nodule along the left major fissure. No follow-up needed if patient is low-risk.This recommendation follows the consensus statement: Guidelines for Management of Incidental Pulmonary Nodules Detected on CT Images: From the Fleischner Society 2017;  Radiology 2017; 284:228-243. 3. Cardiomegaly. 4. Mitral valve replacement.  PFT:    Latest Ref Rng & Units 01/01/2021   11:49 AM 12/01/2019    9:39 AM  PFT Results  FVC-Pre L 1.55  2.13   FVC-Predicted Pre % 51  69   FVC-Post L 1.67    FVC-Predicted Post % 55    Pre FEV1/FVC % % 74  74   Post FEV1/FCV % % 81    FEV1-Pre L 1.15  1.57   FEV1-Predicted Pre % 50  68   FEV1-Post L 1.36    DLCO uncorrected ml/min/mmHg 21.48  14.09   DLCO UNC% % 105  68   DLCO corrected ml/min/mmHg 22.12  14.56   DLCO COR %Predicted % 108  71   DLVA Predicted % 110  105   TLC L 4.50  5.36   TLC % Predicted % 84  100   RV % Predicted % 131  140     Labs:  Path:  Echo 02/11/22: LV EF 50-55%. RV systolic function is moderately reduced. RV size is moderately enlarged. Mildly elevated pulmonary artery systolic pressure 10.2VOZD. RA mildly dilated.  Heart Catheterization:  Assessment & Plan:   Bronchiectasis without complication (HCC)  Gastroesophageal reflux disease without esophagitis  Shortness of breath  Discussion: Basha Krygier is a 76 year old woman with history of mitral regurgitation s/p minimally invassive MV repair and MAZE procedure complicated by LV free wall rupture 10/2019 and scoliosis with 7 spinal surgeries in 2015 who returns to pulmonary clinic for chest pains and shortness of breath.   Her chest/beack/neck/shoulder pains are likely secondary to musculoskeletal issues secondary to her complete spinal fusion. I have recommended she start PT as recommended by her surgical team. I also recommended considering massage therapies.  She is to continue pantoprazole for 3 months daily for her GERD. She can try to stop the medication and monitor her symptoms after.   Her bronchiectasis is very mild based on CT imaging. This is not the etiology of her chest pain and less likely her dyspnea. She has no dysphagia noted on MBS.  Her dyspnea is likely secondary to deconditioning. Her PFTs were  within normal limits last year.  Follow up in 1 year.  Regina Jackson, MD [facility] Office: [phone]    Current Outpatient Medications:    acetaminophen (TYLENOL) 500 MG tablet, Take 500 mg by mouth every 6 (six) hours as needed for moderate pain. , Disp: , Rfl:    apixaban (ELIQUIS) 5 MG TABS tablet, Take 1 tablet (5 mg total) by mouth 2 (two) times daily., Disp: 180 tablet, Rfl: 3   b complex vitamins tablet, Take 1 tablet by mouth daily., Disp: , Rfl:    Calcium Carbonate Antacid (TUMS CHEWY BITES PO), Take 1 tablet by mouth daily  as needed (reflux). , Disp: , Rfl:    Calcium Citrate-Vitamin D (CALCIUM + D PO), Take 1 tablet by mouth daily., Disp: , Rfl:    Carboxymethylcellul-Glycerin (LUBRICATING EYE DROPS OP), Place 1 drop into both eyes daily as needed (dry eyes)., Disp: , Rfl:    cephALEXin (KEFLEX) 500 MG capsule, Take 2 grams (4 tablets) by mouth 1 hour prior to dental cleaning/procedure., Disp: 4 capsule, Rfl: 2   clonazePAM (KLONOPIN) 0.5 MG tablet, Take 1 tablet by mouth twice daily as needed for anxiety, Disp: 30 tablet, Rfl: 3   cyclobenzaprine (FLEXERIL) 10 MG tablet, Take 10 mg by mouth at bedtime as needed for muscle spasms. , Disp: , Rfl: 5   denosumab (PROLIA) 60 MG/ML SOSY injection, Inject 60 mg into the skin every 6 (six) months., Disp: , Rfl:    furosemide (LASIX) 20 MG tablet, Take on Tue-Thur-Sat, Disp: 36 tablet, Rfl: 3   furosemide (LASIX) 40 MG tablet, Take on M-W-F- Sun, Disp: 48 tablet, Rfl: 3   levothyroxine (SYNTHROID) 75 MCG tablet, Take 1 tablet by mouth once daily, Disp: 90 tablet, Rfl: 0   meclizine (ANTIVERT) 25 MG tablet, TAKE 1 TABLET BY MOUTH THREE TIMES DAILY AS NEEDED FOR DIZZINESS, Disp: 30 tablet, Rfl: 0   metoprolol tartrate (LOPRESSOR) 50 MG tablet, Take 1 tablet (50 mg total) by mouth 2 (two) times daily., Disp: 180 tablet, Rfl: 3   Nutritional Supplements (,FEEDING SUPPLEMENT, PROSOURCE PLUS) liquid, Take 30 mLs  by mouth 2 (two) times daily between meals., Disp: 887 mL, Rfl: 0   nystatin (MYCOSTATIN/NYSTOP) powder, Apply topically 2 (two) times daily as needed (skin irritation (under breasts))., Disp: 15 g, Rfl: 0   ondansetron (ZOFRAN) 4 MG tablet, Take 1 tablet (4 mg total) by mouth every 8 (eight) hours as needed., Disp: 30 tablet, Rfl: 0   oxyCODONE (ROXICODONE) 5 MG/5ML solution, Take 4 mLs (4 mg total) by mouth every 4 (four) hours as needed for moderate pain., Disp: , Rfl: 0   pantoprazole (PROTONIX) 40 MG tablet, Take 1 tablet (40 mg total) by mouth daily., Disp: 30 tablet, Rfl: 3   polyethylene glycol (MIRALAX / GLYCOLAX) packet, Take 17 g by mouth daily as needed for mild constipation., Disp: , Rfl:    potassium chloride (KLOR-CON) 10 MEQ tablet, Take 2 tablets by mouth daily for one week only., Disp: 14 tablet, Rfl: 0   promethazine (PHENERGAN) 25 MG tablet, Take 1 tablet (25 mg total) by mouth 2 (two) times daily as needed for nausea or vomiting., Disp: 20 tablet, Rfl: 0   sodium chloride (OCEAN) 0.65 % SOLN nasal spray, Place 1 spray into both nostrils as needed for congestion (nose bleeds)., Disp: , Rfl:    valACYclovir (VALTREX) 500 MG tablet, Take 500 mg by mouth daily as needed (breakouts). , Disp: , Rfl:   -

## 2022-04-05 DIAGNOSIS — I4891 Unspecified atrial fibrillation: Secondary | ICD-10-CM | POA: Diagnosis not present

## 2022-04-05 DIAGNOSIS — I10 Essential (primary) hypertension: Secondary | ICD-10-CM | POA: Diagnosis not present

## 2022-04-07 ENCOUNTER — Encounter: Payer: Self-pay | Admitting: Cardiology

## 2022-04-07 DIAGNOSIS — M542 Cervicalgia: Secondary | ICD-10-CM | POA: Diagnosis not present

## 2022-04-07 DIAGNOSIS — R200 Anesthesia of skin: Secondary | ICD-10-CM | POA: Diagnosis not present

## 2022-04-07 DIAGNOSIS — G629 Polyneuropathy, unspecified: Secondary | ICD-10-CM | POA: Diagnosis not present

## 2022-04-07 DIAGNOSIS — M436 Torticollis: Secondary | ICD-10-CM | POA: Diagnosis not present

## 2022-04-10 ENCOUNTER — Other Ambulatory Visit: Payer: Self-pay | Admitting: Family Medicine

## 2022-04-10 ENCOUNTER — Encounter: Payer: Self-pay | Admitting: Cardiology

## 2022-04-10 ENCOUNTER — Telehealth: Payer: Self-pay | Admitting: Cardiology

## 2022-04-10 NOTE — Telephone Encounter (Signed)
Will endorse this information to the pt.    Called the pt and made her aware of this.   Pt aware that Dr. Juliet Rude office will call her with further instruction and scheduling of botox procedure.  Pt verbalized understanding and agrees with this plan.

## 2022-04-10 NOTE — Telephone Encounter (Signed)
[facility] is calling back stating they received a call asking if patient would need clearance for Botox, and they stated that they do not need clearance from them.

## 2022-04-11 DIAGNOSIS — H838X3 Other specified diseases of inner ear, bilateral: Secondary | ICD-10-CM | POA: Diagnosis not present

## 2022-04-11 DIAGNOSIS — H6123 Impacted cerumen, bilateral: Secondary | ICD-10-CM | POA: Diagnosis not present

## 2022-04-11 DIAGNOSIS — H903 Sensorineural hearing loss, bilateral: Secondary | ICD-10-CM | POA: Diagnosis not present

## 2022-04-11 DIAGNOSIS — J300 Vasomotor rhinitis: Secondary | ICD-10-CM | POA: Diagnosis not present

## 2022-04-11 DIAGNOSIS — Z515 Encounter for palliative care: Secondary | ICD-10-CM | POA: Diagnosis not present

## 2022-04-11 DIAGNOSIS — M2669 Other specified disorders of temporomandibular joint: Secondary | ICD-10-CM | POA: Diagnosis not present

## 2022-04-11 DIAGNOSIS — H9313 Tinnitus, bilateral: Secondary | ICD-10-CM | POA: Diagnosis not present

## 2022-04-23 ENCOUNTER — Other Ambulatory Visit: Payer: Self-pay | Admitting: Family Medicine

## 2022-04-23 DIAGNOSIS — E785 Hyperlipidemia, unspecified: Secondary | ICD-10-CM

## 2022-04-25 DIAGNOSIS — Z23 Encounter for immunization: Secondary | ICD-10-CM | POA: Diagnosis not present

## 2022-04-30 DIAGNOSIS — R040 Epistaxis: Secondary | ICD-10-CM | POA: Diagnosis not present

## 2022-04-30 DIAGNOSIS — J300 Vasomotor rhinitis: Secondary | ICD-10-CM | POA: Diagnosis not present

## 2022-05-02 ENCOUNTER — Ambulatory Visit (INDEPENDENT_AMBULATORY_CARE_PROVIDER_SITE_OTHER): Payer: Medicare Other | Admitting: Family Medicine

## 2022-05-02 ENCOUNTER — Encounter: Payer: Self-pay | Admitting: Family Medicine

## 2022-05-02 VITALS — BP 106/60 | HR 62 | Temp 98.9°F | Resp 18 | Ht 66.0 in | Wt 118.4 lb

## 2022-05-02 DIAGNOSIS — E039 Hypothyroidism, unspecified: Secondary | ICD-10-CM

## 2022-05-02 DIAGNOSIS — F419 Anxiety disorder, unspecified: Secondary | ICD-10-CM

## 2022-05-02 DIAGNOSIS — F32A Depression, unspecified: Secondary | ICD-10-CM

## 2022-05-02 LAB — TSH: TSH: 0.46 u[IU]/mL (ref 0.35–5.50)

## 2022-05-02 LAB — T4, FREE: Free T4: 1.36 ng/dL (ref 0.60–1.60)

## 2022-05-02 LAB — T3, FREE: T3, Free: 3.7 pg/mL (ref 2.3–4.2)

## 2022-05-02 NOTE — Assessment & Plan Note (Signed)
Ongoing issue.  She states she is still searching for a counselor that is a good fit.  She is a palliative care patient and I encouraged her to ask them for resources.  Anxiety- particularly about her health- remains an issue.  Will continue to follow.

## 2022-05-02 NOTE — Assessment & Plan Note (Signed)
Chronic problem.  Currently on Levothyroxine 63mg daily w/o difficulty.  Check labs.  Adjust meds prn

## 2022-05-02 NOTE — Patient Instructions (Signed)
Follow up in 6 months to recheck thyroid We'll notify you of your lab results and make any changes if needed Continue to search for someone to talk to- this will be very beneficial! Call with any questions or concerns Stay Safe!  Stay Healthy! Happy Fall!!

## 2022-05-02 NOTE — Progress Notes (Signed)
   Subjective:    Patient ID: [name], female    DOB: [birth year], 76 y.o.   MRN: [id number]  HPI Hypothyroid- chronic problem, on Levothyroxine 38mg daily.  Denies changes to skin/hair/nails.  No change in energy level recently.    Anxiety- ongoing issue for pt.  She was told by pulmonary that the nodule in her lung was benign- this helped w/ some of her anxiety.  The surgeons encouraged her to get Botox in her neck for pain relief.  This is very scary for her given previous interventions.  Review of Systems For ROS see HPI     Objective:   Physical Exam Vitals reviewed.  Constitutional:      General: She is not in acute distress.    Appearance: She is not ill-appearing.  HENT:     Head: Normocephalic and atraumatic.  Skin:    General: Skin is warm and dry.  Neurological:     Mental Status: She is alert and oriented to person, place, and time. Mental status is at baseline.  Psychiatric:        Behavior: Behavior normal.     Comments: Anxious, circular thought process           Assessment & Plan:

## 2022-05-05 ENCOUNTER — Telehealth: Payer: Self-pay

## 2022-05-05 NOTE — Telephone Encounter (Signed)
-----   Message from [name], MD sent at 05/05/2022  7:36 AM EDT ----- Labs look great!  No changes at this time

## 2022-05-05 NOTE — Telephone Encounter (Signed)
Left pt a vm stating labs are good and no changes are needed

## 2022-05-12 ENCOUNTER — Encounter: Payer: Self-pay | Admitting: Cardiology

## 2022-05-12 DIAGNOSIS — Z79899 Other long term (current) drug therapy: Secondary | ICD-10-CM

## 2022-05-12 DIAGNOSIS — I5032 Chronic diastolic (congestive) heart failure: Secondary | ICD-10-CM

## 2022-05-12 DIAGNOSIS — R0602 Shortness of breath: Secondary | ICD-10-CM

## 2022-05-13 ENCOUNTER — Telehealth: Payer: Self-pay | Admitting: Cardiology

## 2022-05-13 DIAGNOSIS — R0600 Dyspnea, unspecified: Secondary | ICD-10-CM

## 2022-05-13 DIAGNOSIS — Z79899 Other long term (current) drug therapy: Secondary | ICD-10-CM

## 2022-05-13 DIAGNOSIS — I5032 Chronic diastolic (congestive) heart failure: Secondary | ICD-10-CM

## 2022-05-13 NOTE — Telephone Encounter (Signed)
Patient wants to know if it's okay for her to come by tomorrow at 2:30p for labs. She said she replied back via MyChart.

## 2022-05-13 NOTE — Telephone Encounter (Signed)
Called patient back to let her know we will have her on the lab schedule tomorrow. Ordered lab work bmet and bnp per Dr. Johney Frame.

## 2022-05-14 ENCOUNTER — Ambulatory Visit: Payer: Medicare Other

## 2022-05-14 ENCOUNTER — Telehealth: Payer: Self-pay

## 2022-05-14 DIAGNOSIS — R0600 Dyspnea, unspecified: Secondary | ICD-10-CM | POA: Diagnosis not present

## 2022-05-14 DIAGNOSIS — E1136 Type 2 diabetes mellitus with diabetic cataract: Secondary | ICD-10-CM | POA: Diagnosis not present

## 2022-05-14 DIAGNOSIS — I5032 Chronic diastolic (congestive) heart failure: Secondary | ICD-10-CM | POA: Diagnosis not present

## 2022-05-14 DIAGNOSIS — Z79899 Other long term (current) drug therapy: Secondary | ICD-10-CM | POA: Diagnosis not present

## 2022-05-14 DIAGNOSIS — H25813 Combined forms of age-related cataract, bilateral: Secondary | ICD-10-CM | POA: Diagnosis not present

## 2022-05-14 DIAGNOSIS — H35372 Puckering of macula, left eye: Secondary | ICD-10-CM | POA: Diagnosis not present

## 2022-05-14 NOTE — Telephone Encounter (Signed)
Forms from [facility] have been placed in [name] to be signed . Once signed I will fax back

## 2022-05-14 NOTE — Telephone Encounter (Signed)
Lab appt made for the pt for today 11/8 to check a BMET and PRO-BNP.  Pt made aware of appt date and time via mychart message.

## 2022-05-15 ENCOUNTER — Telehealth: Payer: Self-pay | Admitting: Cardiology

## 2022-05-15 ENCOUNTER — Telehealth: Payer: Self-pay | Admitting: *Deleted

## 2022-05-15 ENCOUNTER — Encounter: Payer: Self-pay | Admitting: Cardiology

## 2022-05-15 DIAGNOSIS — Z9889 Other specified postprocedural states: Secondary | ICD-10-CM

## 2022-05-15 DIAGNOSIS — R7989 Other specified abnormal findings of blood chemistry: Secondary | ICD-10-CM

## 2022-05-15 DIAGNOSIS — R0602 Shortness of breath: Secondary | ICD-10-CM

## 2022-05-15 DIAGNOSIS — I5032 Chronic diastolic (congestive) heart failure: Secondary | ICD-10-CM

## 2022-05-15 DIAGNOSIS — Z79899 Other long term (current) drug therapy: Secondary | ICD-10-CM

## 2022-05-15 DIAGNOSIS — Z8679 Personal history of other diseases of the circulatory system: Secondary | ICD-10-CM

## 2022-05-15 LAB — BASIC METABOLIC PANEL
BUN/Creatinine Ratio: 21 (ref 12–28)
BUN: 20 mg/dL (ref 8–27)
CO2: 32 mmol/L — ABNORMAL HIGH (ref 20–29)
Calcium: 9.7 mg/dL (ref 8.7–10.3)
Chloride: 100 mmol/L (ref 96–106)
Creatinine, Ser: 0.95 mg/dL (ref 0.57–1.00)
Glucose: 91 mg/dL (ref 70–99)
Potassium: 4.2 mmol/L (ref 3.5–5.2)
Sodium: 143 mmol/L (ref 134–144)
eGFR: 62 mL/min/{1.73_m2} (ref >59)

## 2022-05-15 LAB — PRO B NATRIURETIC PEPTIDE: NT-Pro BNP: 1296 pg/mL — ABNORMAL HIGH (ref 0–738)

## 2022-05-15 MED ORDER — POTASSIUM CHLORIDE CRYS ER 20 MEQ PO TBCR: 20.0000 meq | EXTENDED_RELEASE_TABLET | Freq: Every day | ORAL | 1 refills | Status: DC

## 2022-05-15 MED ORDER — FUROSEMIDE 40 MG PO TABS: 40.0000 mg | ORAL_TABLET | Freq: Every day | ORAL | 1 refills | Status: DC

## 2022-05-15 NOTE — Telephone Encounter (Signed)
The patient has been notified of the result and verbalized understanding.  All questions (if any) were answered.  Pt aware to increase her lasix to 40 mg po daily.  Confirmed with her if she is taking KDUR or not, and if so, is she taking 20 mEq.  Pt states she is not taking KDUR at this time, for last time she took this, she was only advised to take this for one week.  Dr. Johney Frame made aware of this and she advised that the pt start back taking KDUR at 20 mEq po daily along with the increased lasix 40 mg po daily.  Confirmed the pharmacy of choice with the pt.  Pt aware she will need a repeat BMET and PRO-BNP done in 10 days, with increased meds.   Scheduled the pt for repeat labs to recheck BMET and PRO-BNP in 10 days on 05/26/22.  Pt states she will start new increased meds tomorrow and will be here for repeat labs on 11/20.  Pt verbalized understanding and agrees with this plan.

## 2022-05-15 NOTE — Telephone Encounter (Signed)
**Note De-Identified [name] Obfuscation** The pt had a lot of questions about applying for Eliquis assistance in 2024. I answered all of her questions that I could and I gave her [employer] phone number so she can call them to  ask questions that I could not answer.  She thanked me for calling her back.

## 2022-05-15 NOTE — Telephone Encounter (Signed)
The patient has been notified of the result and verbalized understanding.  All questions (if any) were answered.   Pt aware to increase her lasix to 40 mg po daily.  Confirmed with her if she is taking KDUR or not, and if so, is she taking 20 mEq.  Pt states she is not taking KDUR at this time, for last time she took this, she was only advised to take this for one week.  Dr. Johney Frame made aware of this and she advised that the pt start back taking KDUR at 20 mEq po daily along with the increased lasix 40 mg po daily.   Confirmed the pharmacy of choice with the pt.   Pt aware she will need a repeat BMET and PRO-BNP done in 10 days, with increased meds.    Scheduled the pt for repeat labs to recheck BMET and PRO-BNP in 10 days on 05/26/22.   Pt states she will start new increased meds tomorrow and will be here for repeat labs on 11/20.   Pt verbalized understanding and agrees with this plan.             05/15/22  3:44 PM You routed this conversation to Freada Bergeron, MD Me      05/15/22  3:37 PM Note ----- Message from Freada Bergeron, MD sent at 05/15/2022  8:51 AM EST ----- Her fluid levels are elevated. Her electrolytes look great. We will have to increase her lasix back to '40mg'$  daily and repeat BMET and BNP in 10 days to ensure things are getting better. How much potassium is she taking? 16mq daily?

## 2022-05-15 NOTE — Telephone Encounter (Signed)
-----   Message from Freada Bergeron, MD sent at 05/15/2022  8:51 AM EST ----- Her fluid levels are elevated. Her electrolytes look great. We will have to increase her lasix back to '40mg'$  daily and repeat BMET and BNP in 10 days to ensure things are getting better. How much potassium is she taking? 55mq daily?

## 2022-05-15 NOTE — Telephone Encounter (Signed)
New Message:L     Please call, concerning her application for Eliquis for this year.

## 2022-05-19 ENCOUNTER — Encounter: Payer: Self-pay | Admitting: Cardiology

## 2022-05-19 DIAGNOSIS — M961 Postlaminectomy syndrome, not elsewhere classified: Secondary | ICD-10-CM | POA: Diagnosis not present

## 2022-05-19 DIAGNOSIS — Z79891 Long term (current) use of opiate analgesic: Secondary | ICD-10-CM | POA: Diagnosis not present

## 2022-05-19 DIAGNOSIS — M4693 Unspecified inflammatory spondylopathy, cervicothoracic region: Secondary | ICD-10-CM | POA: Diagnosis not present

## 2022-05-19 DIAGNOSIS — G894 Chronic pain syndrome: Secondary | ICD-10-CM | POA: Diagnosis not present

## 2022-05-19 DIAGNOSIS — M5412 Radiculopathy, cervical region: Secondary | ICD-10-CM | POA: Diagnosis not present

## 2022-05-20 DIAGNOSIS — G243 Spasmodic torticollis: Secondary | ICD-10-CM | POA: Diagnosis not present

## 2022-05-20 NOTE — Telephone Encounter (Signed)
Forms faxed and placed in scan  

## 2022-05-20 NOTE — Telephone Encounter (Signed)
Forms completed and returned to [facility] 

## 2022-05-21 ENCOUNTER — Encounter: Payer: Self-pay | Admitting: Cardiology

## 2022-05-26 ENCOUNTER — Ambulatory Visit: Payer: Medicare Other

## 2022-05-26 DIAGNOSIS — Z9889 Other specified postprocedural states: Secondary | ICD-10-CM | POA: Diagnosis not present

## 2022-05-26 DIAGNOSIS — R7989 Other specified abnormal findings of blood chemistry: Secondary | ICD-10-CM | POA: Diagnosis not present

## 2022-05-26 DIAGNOSIS — Z79899 Other long term (current) drug therapy: Secondary | ICD-10-CM | POA: Diagnosis not present

## 2022-05-26 DIAGNOSIS — Z8679 Personal history of other diseases of the circulatory system: Secondary | ICD-10-CM | POA: Diagnosis not present

## 2022-05-26 DIAGNOSIS — R0602 Shortness of breath: Secondary | ICD-10-CM | POA: Diagnosis not present

## 2022-05-26 DIAGNOSIS — I5032 Chronic diastolic (congestive) heart failure: Secondary | ICD-10-CM | POA: Diagnosis not present

## 2022-05-27 ENCOUNTER — Telehealth: Payer: Self-pay

## 2022-05-27 LAB — BASIC METABOLIC PANEL
BUN/Creatinine Ratio: 22 (ref 12–28)
BUN: 20 mg/dL (ref 8–27)
CO2: 33 mmol/L — ABNORMAL HIGH (ref 20–29)
Calcium: 9.8 mg/dL (ref 8.7–10.3)
Chloride: 99 mmol/L (ref 96–106)
Creatinine, Ser: 0.91 mg/dL (ref 0.57–1.00)
Glucose: 94 mg/dL (ref 70–99)
Potassium: 4.1 mmol/L (ref 3.5–5.2)
Sodium: 145 mmol/L — ABNORMAL HIGH (ref 134–144)
eGFR: 65 mL/min/{1.73_m2} (ref >59)

## 2022-05-27 LAB — PRO B NATRIURETIC PEPTIDE: NT-Pro BNP: 1074 pg/mL — ABNORMAL HIGH (ref 0–738)

## 2022-05-27 NOTE — Telephone Encounter (Signed)
-----   Message from Freada Bergeron, MD sent at 05/27/2022  4:10 PM EST ----- Let's give her 3 days of an extra dose of lasix '20mg'$   in the afternoon to see if that does the trick (so '40mg'$  in the AM and '20mg'$  in the PM). She can keep the same dosing of her potassium. We can repeat BMET and BNP in 7 days to see if that helps.

## 2022-05-27 NOTE — Telephone Encounter (Signed)
Pt assistance forms signed and dated by Dr. Johney Frame.   Faxed all completed forms to the contact information provided on cover sheet.   Confirmation fax received.    Will make PA Nurse aware of completion.

## 2022-05-27 NOTE — Telephone Encounter (Signed)
The patient has been notified of the result and verbalized understanding.  All questions (if any) were answered. [place], RN 05/27/2022 4:20 PM   Pt wrote down instructions for furosemide.  Pt will come in for f/u labs on 06/03/22.

## 2022-05-27 NOTE — Telephone Encounter (Signed)
**Note De-Identified [name] Obfuscation** The pts completed [employer] pt asst application for Eliquis was left at the office with documents.  I have completed the providers page of her application and have e-mailed all to Dr Jacolyn Reedy nurse so she can obtain her signature, date it, and to then fax all to [facility] at the fax number written on the cover letter included.

## 2022-05-28 DIAGNOSIS — Z79899 Other long term (current) drug therapy: Secondary | ICD-10-CM | POA: Diagnosis not present

## 2022-05-28 DIAGNOSIS — Z5181 Encounter for therapeutic drug level monitoring: Secondary | ICD-10-CM | POA: Diagnosis not present

## 2022-05-28 DIAGNOSIS — M810 Age-related osteoporosis without current pathological fracture: Secondary | ICD-10-CM | POA: Diagnosis not present

## 2022-06-03 ENCOUNTER — Ambulatory Visit: Payer: Medicare Other | Attending: Cardiology

## 2022-06-03 ENCOUNTER — Telehealth: Payer: Self-pay | Admitting: Family Medicine

## 2022-06-03 DIAGNOSIS — R0602 Shortness of breath: Secondary | ICD-10-CM | POA: Diagnosis not present

## 2022-06-03 DIAGNOSIS — I5032 Chronic diastolic (congestive) heart failure: Secondary | ICD-10-CM

## 2022-06-03 DIAGNOSIS — Z79899 Other long term (current) drug therapy: Secondary | ICD-10-CM | POA: Diagnosis not present

## 2022-06-03 NOTE — Telephone Encounter (Signed)
Initial Comment Caller states she is seeing patient for Palliative Care. She has had a nose bleed. She is on Eloquis and needs to know if she should hold her dose. Patient Name [name] Patient DOB 08-23-1945 Requesting Provider Sigmund Hazel Physician Number [phone] Facility Name [facility]. Time Disposition Final User 05/31/2022 10:19:18 AM Send to [facility] Paging Queue Rica Mote 05/31/2022 10:27:37 AM Called On-Call Provider Mockler, Tiffany 05/31/2022 10:28:16 AM Page Completed Yes Mockler, Tiffany Paging DoctorName Phone DateTime Result/Outcome Message Type Notes Loralyn Freshwater- MD [phone] 05/31/2022 10:27:37 AM Called On Call Provider - Reached Doctor Paged Loralyn Freshwater- MD 05/31/2022 10:28:06 AM Spoke with On Call - General Message Result Spoke with [facility] and warm transferred to caller. Call Closed By: Marlou Sa Transaction Date/Time: 05/31/2022 10:15:52 AM (ET

## 2022-06-03 NOTE — Telephone Encounter (Signed)
Old message from [facility] health do we need to do anything with this at this time? Spoke to the on call

## 2022-06-04 ENCOUNTER — Ambulatory Visit: Payer: Medicare Other | Admitting: Pulmonary Disease

## 2022-06-04 LAB — BASIC METABOLIC PANEL
BUN/Creatinine Ratio: 24 (ref 12–28)
BUN: 21 mg/dL (ref 8–27)
CO2: 33 mmol/L — ABNORMAL HIGH (ref 20–29)
Calcium: 9.4 mg/dL (ref 8.7–10.3)
Chloride: 99 mmol/L (ref 96–106)
Creatinine, Ser: 0.89 mg/dL (ref 0.57–1.00)
Glucose: 88 mg/dL (ref 70–99)
Potassium: 4.8 mmol/L (ref 3.5–5.2)
Sodium: 144 mmol/L (ref 134–144)
eGFR: 67 mL/min/{1.73_m2} (ref >59)

## 2022-06-04 LAB — PRO B NATRIURETIC PEPTIDE: NT-Pro BNP: 1282 pg/mL — ABNORMAL HIGH (ref 0–738)

## 2022-06-04 NOTE — Telephone Encounter (Signed)
Nothing to do as this note is from 4 days ago

## 2022-06-05 ENCOUNTER — Encounter: Payer: Self-pay | Admitting: Cardiology

## 2022-06-05 NOTE — Telephone Encounter (Signed)
Called pt reviewed lab results.  Reviewed ED precautions.  Scheduled OV for 06/09/22 at 2:20 pm.

## 2022-06-05 NOTE — Telephone Encounter (Signed)
Pt called in to request the number for Dr. Stanford Breed.  Pt reports was told by Dr. Johney Frame that MD would be happy to see her d/t distance. Pt lives in [place] and Dr. Jacalyn Lefevre office is closer.  Advised pt do not see a request for provider switch.  Will send request to Dr. Johney Frame to advise on who pt should see closer to her home in [place].

## 2022-06-08 NOTE — Progress Notes (Addendum)
Office Visit    Patient Name: [name] Date of Encounter: 06/09/2022  PCP:  [name], MD   [place]  Cardiologist:  Freada Bergeron, MD  Advanced Practice Provider:  No care team member to display Electrophysiologist:  Will Meredith Leeds, MD   HPI    [name] is a 76 y.o. female with past medical history significant for severe MR status post MV repair (32 mm Sorin memo 4D ring annuloplasty valve) complicated by LV free wall laceration requiring median sternotomy, A-fib, sclerosis requiring multiple spine surgeries and anxiety presents today for follow-up appointment.  Per review of patient's record, she had been followed for MVP/MR.  TEE on 5/70/21 demonstrated presence of mitral prolapse with a large flail segment felt to be the A3 portion of the anterior leaflet with severe mitral regurgitation.  She was seen by Dr. Rockey Situ and underwent minimally send mitral valve repair, complete Maze procedure and sternotomy with repair of ventricular free wall laceration on 01/17/2020.  She was extubated the evening of surgery.  She was started on Coumadin for her mitral valve repair.  Postop course complicated by acute blood loss anemia requiring blood transfusions and atrial fibrillation with RVR for which she was placed on amiodarone and Lopressor.  Her Tikosyn was discontinued.  She was followed by [name] in the clinic and has been transitioned to [place]  She was seen in the clinic 08/23/2020 where she was having right-sided chest pain/burning her incision site was located.  She was very concerned it might related to her heart or breast cancer.  Given localization around her scar tissue site, nonexertional in nature and no masses, suspected it was likely related to neuropathic pain and scar tissue.  CTA, chest x-ray, D-dimer all reassuring.  Given her significant allergies to medications and we have recommended she follow-up with her chronic pain  physician.  She was seen by Dr. Roxy Manns 01/15/2021 where she was deemed stable from a CV standpoint.  She was seen in the clinic 01/23/2021 where she was having intermittent shortness of breath, chronic pain, depression.  No changes in medication at that time.  She was last seen 01/2022 and she was having significant hoarseness of her voice sometimes making it difficult to talk.  She was scheduled to see ENT for her hoarseness, allergies, and rhinorrhea.  She had not routinely checked her blood pressures at home but when she did she noticed lower readings.  She was taking metoprolol.  Today, she tells me she was SOB all day yesterday and was unsure if she needed to go to the hospital or not.  She decided to just come to her appointment today.  She states her shortness of breath is better today.  She tells me she also suffers from anxiety.  We discussed her most recent echocardiogram results.  She states she thinks that she is retaining fluid.  Back in August she was having more shortness of breath and her Lasix was increased for a few days which she felt helped for the short term.  We discussed other medications that can help with fluid overload including Farxiga/Jardiance.  She is very hesitant to start any new medications.  She would like to discuss these medicines further with Dr. Stanford Breed whom she has an appointment with on Wednesday.  We also talked about increasing her Lasix for the next couple of days to try and help with her shortness of breath.  She recently saw a  pulmonologist in [place] following intervention from their perspective for her SOB.  She does not seem particularly fluid overloaded on exam today.  Reports no chest pain, pressure, or tightness. No edema, orthopnea, PND. Reports no palpitations.   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   "right"   Mitral regurgitation    Osteopenia 02/18/2012   Osteoporosis    PAF (paroxysmal atrial fibrillation) ([place]) 2012   Peripheral neuropathy 11/06/2011   PMDD (premenstrual dysphoric disorder)    PONV (postoperative nausea and vomiting)    nausea, vomiting, hives and dizziness    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/2020   Complete bilateral atrial lesion set using cryothermy and bipolar radiofrequency ablation with clipping of LA appendage via right mini-thoracotomy approach   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   Complex valvuloplasty including artificial Gore-tex neochord placement x12 with 78m Sorin Memo 4D ring annuloplasty   Seasonal allergies    Vaginal delivery    ONE NSVD   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   Past Surgical History:  Procedure Laterality Date   ANTERIOR CERVICAL DECOMP/DISCECTOMY FUSION  ~ 2003   BACK SURGERY     BREAST SURGERY     BREAST BIOPSY--RIGHT BENIGN   BUNIONECTOMY Bilateral    CARDIOVERSION N/A 01/26/2020   Procedure: CARDIOVERSION;  Surgeon: OGeralynn Rile MD;  [facility]  Service: Cardiovascular;  Laterality: N/A;   COSMETIC SURGERY  2016   "back of my neck; related to earlier fusion"   CVirgil "several times"   DBisbeeRight    "removed bone protruding out of my forehead"   HARDWARE REMOVAL  2016   "related to neck OR"   INCONTINENCE SURGERY     IR THORACENTESIS ASP PLEURAL SPACE W/IMG GUIDE  02/16/2020   MINIMALLY INVASIVE MAZE PROCEDURE N/A 01/17/2020    Procedure: MINIMALLY INVASIVE MAZE PROCEDURE;  Surgeon: ORexene Alberts MD;  [facility]  Service: Open Heart Surgery;  Laterality: N/A;   MITRAL VALVE REPAIR Right 01/17/2020   Procedure: MINIMALLY INVASIVE MITRAL VALVE REPAIR (MVR) USING MEMO 4D 32MM;  Surgeon: ORexene Alberts MD;  [facility]  Service: Open Heart Surgery;  Laterality: Right;   POSTERIOR CERVICAL FUSION/FORAMINOTOMY  ~ 2008; 2015   RIGHT/LEFT HEART CATH AND CORONARY ANGIOGRAPHY N/A 12/02/2019   Procedure: RIGHT/LEFT HEART CATH AND CORONARY ANGIOGRAPHY;  Surgeon: MBurnell Blanks MD;  [facility];  Service: Cardiovascular;  Laterality: N/A;   SHOULDER ARTHROSCOPY W/ ROTATOR CUFF REPAIR Right 2012   SPINAL FUSION  06/2014 - 2018 X ?7   "scoliosis; my entire back"   TEE WITHOUT CARDIOVERSION N/A 11/21/2019   Procedure: TRANSESOPHAGEAL ECHOCARDIOGRAM (TEE);  Surgeon: NJosue Hector MD;  [facility]  Service: Cardiovascular;  Laterality: N/A;  TEE WITHOUT CARDIOVERSION N/A 01/17/2020   Procedure: TRANSESOPHAGEAL ECHOCARDIOGRAM (TEE);  Surgeon: Rexene Alberts, MD;  Location: [facility];  Service: Open Heart Surgery;  Laterality: N/A;   TEE WITHOUT CARDIOVERSION N/A 01/26/2020   Procedure: TRANSESOPHAGEAL ECHOCARDIOGRAM (TEE);  Surgeon: Geralynn Rile, MD;  Location: [facility];  Service: Cardiovascular;  Laterality: N/A;   TUBAL LIGATION     VAGINAL HYSTERECTOMY     TVH    Allergies  Allergies  Allergen Reactions   Buprenorphine Nausea Only and Other (See Comments)    sedation and adhesive reaction Other reaction(s): nausea, sedation and adhesive reaction Other reaction(s): nausea, sedation and adhesive reaction, Other (See Comments), Other (See Comments) sedation and adhesive reaction Other reaction(s): nausea, sedation and adhesive reaction sedation and adhesive reaction Other reaction(s): nausea, sedation and adhesive reaction sedation and adhesive  reaction Other reaction(s): nausea, sedation and adhesive reaction sedation and adhesive reaction Other reaction(s): nausea, sedation and adhesive reaction sedation and adhesive reaction Other reaction(s): nausea, sedation and adhesive reaction sedation and adhesive reaction Other reaction(s): nausea, sedation and adhesive reaction   Erythromycin Nausea And Vomiting and Other (See Comments)    Dizziness  Other reaction(s): Other (See Comments) Dizziness  unknown unknown Dizziness  BURNING ALL OVER  BURNING ALL OVER  unknown Dizziness  BURNING ALL OVER    Iodinated Contrast Media Hives, Nausea Only and Rash    unknown unknown unknown   Latex Palpitations, Other (See Comments) and Rash     hives and sores Other reaction(s): hives and sores Other reaction(s):  hives and sores, Other (See Comments), Other (See Comments)  hives and sores Other reaction(s): hives and sores hives hives  hives and sores Other reaction(s): hives and sores hives Sores  hives and sores Other reaction(s): hives and sores hives Sores  hives and sores Other reaction(s): hives and sores hives  hives and sores Other reaction(s): hives and sores hives Sores  hives and sores Other reaction(s): hives and sores   Other Other (See Comments)    CAN NOT TAKE , aLPRAZOLAM, OR ELAVIL DUE TO AFIB FOR ANXIETY  IV CONTRAST /"DYE". Other reaction(s): hives and sores Other reaction(s): Other (See Comments), Other (See Comments) CAN NOT TAKE , aLPRAZOLAM, OR ELAVIL DUE TO AFIB FOR ANXIETY  IV CONTRAST /"DYE". Other reaction(s): hives and sores IV CONTRAST /"DYE". CAN NOT TAKE , aLPRAZOLAM, OR ELAVIL DUE TO AFIB FOR ANXIETY  IV CONTRAST /"DYE". Other reaction(s): hives and sores Patient states that every time she has anesthesia, she wakes up vomiting, has chills, blood pressure drops CAN NOT TAKE , aLPRAZOLAM, OR ELAVIL DUE TO AFIB FOR ANXIETY  IV CONTRAST /"DYE". Other reaction(s): hives and  sores IV CONTRAST /"DYE". Patient states that every time she has anesthesia, she wakes up vomiting, has chills, blood pressure drops CAN NOT TAKE , aLPRAZOLAM, OR ELAVIL DUE TO AFIB FOR ANXIETY  IV CONTRAST /"DYE". Other reaction(s): hives and sores IV CONTRAST /"DYE". CAN NOT TAKE , aLPRAZOLAM, OR ELAVIL DUE TO AFIB FOR ANXIETY  IV CONTRAST /"DYE". Other reaction(s): hives and sores Patient states that every time she has anesthesia, she wakes up vomiting, has chills, blood pressure drops CAN NOT TAKE , aLPRAZOLAM, OR ELAVIL DUE TO AFIB FOR ANXIETY  IV CONTRAST /"DYE". Other reaction(s): hives and sores   Tape Rash, Other (See Comments) and Itching    Heart monitor stickers must be rotated in order to prevent rash Other reaction(s): hives and sores Adhesive on EKG tabs=BURNS SKIN**PAPER TAPE OK** sores Adhesive  on EKG tabs. Per Pt, Ok w/Paper Tape Adhesive on EKG tabs=BURNS SKIN**PAPER TAPE OK** sores Other reaction(s): Other (See Comments) Heart monitor stickers must be rotated in order to prevent rash Adhesive on EKG tabs=BURNS SKIN**PAPER TAPE OK** sores   Ciprofloxacin Other (See Comments)    Other reaction(s): Other (See Comments) Burning all over Burning all over Burning all over   Duloxetine Hcl     Severe diarrhea and upset stomach Other reaction(s): Other (See Comments), Unknown Severe diarrhea and upset stomach Severe diarrhea and upset stomach Severe diarrhea and upset stomach   Erythromycin Base     Other reaction(s): Other (See Comments), Unknown   Hydrocodone Other (See Comments)    Sedation,dizziness, and nausea Other reaction(s): sedation and nausea Other reaction(s): sedation and nausea Other reaction(s): Other (See Comments), Other (See Comments), sedation and nausea Sedation,dizziness, and nausea Other reaction(s): sedation and nausea Other reaction(s): sedation and nausea Sedation,dizziness, and nausea Other reaction(s): sedation and  nausea Other reaction(s): sedation and nausea Sedation,dizziness, and nausea Other reaction(s): sedation and nausea Other reaction(s): sedation and nausea Sedation,dizziness, and nausea Other reaction(s): sedation and nausea Other reaction(s): sedation and nausea Sedation,dizziness, and nausea Other reaction(s): sedation and nausea Other reaction(s): sedation and nausea Sedation,dizziness, and nausea Other reaction(s): sedation and nausea Other reaction(s): sedation and nausea   Hydromorphone Other (See Comments)    Sedation,dizziness, and nausea Other reaction(s): sedation and nausea Other reaction(s): sedation and nausea Other reaction(s): Other (See Comments), Other (See Comments), sedation and nausea Sedation,dizziness, and nausea Other reaction(s): sedation and nausea Other reaction(s): sedation and nausea Sedation,dizziness, and nausea Other reaction(s): sedation and nausea Other reaction(s): sedation and nausea Sedation,dizziness, and nausea Other reaction(s): sedation and nausea Other reaction(s): sedation and nausea Sedation,dizziness, and nausea Other reaction(s): sedation and nausea Other reaction(s): sedation and nausea Sedation,dizziness, and nausea Other reaction(s): sedation and nausea Other reaction(s): sedation and nausea Sedation,dizziness, and nausea Other reaction(s): sedation and nausea Other reaction(s): sedation and nausea   Iodine     unknown Other reaction(s): Other (See Comments) unknown unknown unknown unknown unknown unknown   Levofloxacin Nausea And Vomiting and Nausea Only   Metrizamide Hives   Nsaids     CANNOT TAKE PER CARDIOLOGIST DUE TO AFIB    Nucynta [Tapentadol Hcl]     nausea and sedation   Oxycodone Other (See Comments)    Delusions (intolerance)  PILLS ONLY sedation, dizziness, nausea,  and itching Other reaction(s): sedation and nausea Other reaction(s): sedation and nausea Other reaction(s): Hallucinations, Other Other  reaction(s): Other (See Comments), sedation and nausea Delusions (intolerance)  PILLS ONLY sedation, dizziness, nausea,  and itching Other reaction(s): sedation and nausea Other reaction(s): sedation and nausea Delusions (intolerance)  PILLS ONLY sedation, dizziness, nausea,  and itching Other reaction(s): sedation and nausea Other reaction(s): sedation and nausea Delusions (intolerance)  PILLS ONLY sedation, dizziness, nausea,  and itching Other reaction(s): sedation and nausea Other reaction(s): sedation and nausea Delusions (intolerance)  PILLS ONLY sedation, dizziness, nausea,  and itching Other reaction(s): sedation and nausea Other reaction(s): sedation and nausea Delusions (intolerance)  PILLS ONLY sedation, dizziness, nausea,  and itching Other reaction(s): sedation and nausea Other reaction(s): sedation and nausea Delusions (intolerance)  PILLS ONLY sedation, dizziness, nausea,  and itching Other reaction(s): sedation and nausea Other reaction(s): sedation and nausea   Pentazocine Other (See Comments)    Unknown Other reaction(s): Unknown Other reaction(s): Unknown Other reaction(s): Other (See Comments), Other (See Comments), Unknown Unknown Other reaction(s): Unknown Other reaction(s): Unknown Unknown Other reaction(s): Unknown Other reaction(s):  Unknown Unknown Other reaction(s): Unknown Other reaction(s): Unknown Unknown Other reaction(s): Unknown Other reaction(s): Unknown Unknown Other reaction(s): Unknown Other reaction(s): Unknown Unknown Other reaction(s): Unknown Other reaction(s): Unknown   Septra [Bactrim]     Hives    Sulfasalazine Hives   Morphine Anxiety, Other (See Comments) and Nausea Only    sedation and nausea Other reaction(s): sedation and nausea Other reaction(s): sedation and nausea Other reaction(s): Delusions (intolerance), Other (See Comments), Other (See Comments), sedation and nausea sedation and nausea Other reaction(s):  sedation and nausea Other reaction(s): sedation and nausea sedation and nausea Other reaction(s): sedation and nausea Other reaction(s): sedation and nausea PT CANNOT WAKE UP AFTER TAKING MEDICATION AND HAS NIGHTMARES  sedation and nausea Other reaction(s): sedation and nausea Other reaction(s): sedation and nausea PT CANNOT WAKE UP AFTER TAKING MEDICATION AND HAS NIGHTMARES  sedation and nausea Other reaction(s): sedation and nausea Other reaction(s): sedation and nausea sedation and nausea Other reaction(s): sedation and nausea Other reaction(s): sedation and nausea PT CANNOT WAKE UP AFTER TAKING MEDICATION AND HAS NIGHTMARES  sedation and nausea Other reaction(s): sedation and nausea Other reaction(s): sedation and nausea   Sulfa Antibiotics Hives, Nausea Only and Rash    rash rash rash   Sulfamethoxazole-Trimethoprim Rash    Hives  Hives  Hives  Hives  Hives  Hives    Tapentadol Nausea Only    Other reaction(s): nausea and sedation, Other (See Comments) nausea and sedation nausea and sedation     EKGs/Labs/Other Studies Reviewed:   The following studies were reviewed today:  02/11/2022 echocardiogram IMPRESSIONS     1. Left ventricular ejection fraction, by estimation, is 50 to 55%. The  left ventricle has low normal function. Left ventricular endocardial  border not optimally defined to evaluate regional wall motion. Left  ventricular diastolic function could not be  evaluated. There is the interventricular septum is flattened in systole,  consistent with right ventricular pressure overload.   2. Right ventricular systolic function is moderately reduced. The right  ventricular size is mildly enlarged. There is mildly elevated pulmonary  artery systolic pressure. The estimated right ventricular systolic  pressure is 40.9 mmHg.   3. Right atrial size was mildly dilated.   4. The mitral valve has been repaired/replaced. Trivial mitral valve  regurgitation. No  evidence of mitral stenosis. The mean mitral valve  gradient is 1.9 mmHg with average heart rate of 59 bpm. There is a 32 mm  Sorin Memo 4D prosthetic annuloplasty ring   present in the mitral position. Procedure Date: 01/17/20. Echo findings  are consistent with normal structure and function of the mitral valve  prosthesis.   5. The aortic valve is tricuspid. There is mild calcification of the  aortic valve. There is mild thickening of the aortic valve. Aortic valve  regurgitation is mild. Aortic valve sclerosis is present, with no evidence  of aortic valve stenosis.   6. The inferior vena cava is dilated in size with <50% respiratory  variability, suggesting right atrial pressure of 15 mmHg.   FINDINGS   Left Ventricle: Left ventricular ejection fraction, by estimation, is 50  to 55%. The left ventricle has low normal function. Left ventricular  endocardial border not optimally defined to evaluate regional wall motion.  Global longitudinal strain performed   but not reported based on interpreter judgement due to suboptimal  tracking. The left ventricular internal cavity size was normal in size.  There is no left ventricular hypertrophy. Abnormal (paradoxical) septal  motion consistent with post-operative  status and the interventricular septum is flattened in systole, consistent  with right ventricular pressure overload. Left ventricular diastolic  function could not be evaluated due to mitral valve repair. Left  ventricular diastolic function could not be  evaluated.   Right Ventricle: The right ventricular size is mildly enlarged. No  increase in right ventricular wall thickness. Right ventricular systolic  function is moderately reduced. There is mildly elevated pulmonary artery  systolic pressure. The tricuspid  regurgitant velocity is 2.72 m/s, and with an assumed right atrial  pressure of 15 mmHg, the estimated right ventricular systolic pressure is  81.1 mmHg.   Left  Atrium: Left atrial size was normal in size.   Right Atrium: Right atrial size was mildly dilated.   Pericardium: There is no evidence of pericardial effusion.   Mitral Valve: The mitral valve has been repaired/replaced. Trivial mitral  valve regurgitation. There is a 32 mm Sorin Memo 4D prosthetic  annuloplasty ring present in the mitral position. Procedure Date: 01/17/20.  Echo findings are consistent with normal  structure and function of the mitral valve prosthesis. No evidence of  mitral valve stenosis. The mean mitral valve gradient is 1.9 mmHg with  average heart rate of 59 bpm.   Tricuspid Valve: The tricuspid valve is normal in structure. Tricuspid  valve regurgitation is trivial. No evidence of tricuspid stenosis.   Aortic Valve: The aortic valve is tricuspid. There is mild calcification  of the aortic valve. There is mild thickening of the aortic valve. Aortic  valve regurgitation is mild. Aortic regurgitation PHT measures 624 msec.  Aortic valve sclerosis is present,   with no evidence of aortic valve stenosis.   Pulmonic Valve: The pulmonic valve was normal in structure. Pulmonic valve  regurgitation is mild to moderate. No evidence of pulmonic stenosis.   Aorta: The aortic root is normal in size and structure.   Venous: The inferior vena cava is dilated in size with less than 50%  respiratory variability, suggesting right atrial pressure of 15 mmHg.   IAS/Shunts: No atrial level shunt detected by color flow Doppler.   EKG:  EKG is ordered today. NSR with PACs, rate 72 bpm   Recent Labs: 10/31/2021: ALT 22; Hemoglobin 13.4; Platelets 187.0 05/02/2022: TSH 0.46 06/03/2022: BUN 21; Creatinine, Ser 0.89; NT-Pro BNP 1,282; Potassium 4.8; Sodium 144  Recent Lipid Panel    Component Value Date/Time   CHOL 175 12/10/2020 1338   TRIG 79.0 12/10/2020 1338   HDL 80.40 12/10/2020 1338   CHOLHDL 2 12/10/2020 1338   VLDL 15.8 12/10/2020 1338   LDLCALC 79 12/10/2020 1338    LDLDIRECT 136.5 06/08/2013 1049     Home Medications   Current Meds  Medication Sig   acetaminophen (TYLENOL) 500 MG tablet Take 500 mg by mouth every 6 (six) hours as needed for moderate pain.    apixaban (ELIQUIS) 5 MG TABS tablet Take 1 tablet (5 mg total) by mouth 2 (two) times daily.   b complex vitamins tablet Take 1 tablet by mouth daily.   Calcium Carbonate Antacid (TUMS CHEWY BITES PO) Take 1 tablet by mouth daily as needed (reflux).    Calcium Citrate-Vitamin D (CALCIUM + D PO) Take 1 tablet by mouth daily.   Carboxymethylcellul-Glycerin (LUBRICATING EYE DROPS OP) Place 1 drop into both eyes daily as needed (dry eyes).   cephALEXin (KEFLEX) 500 MG capsule Take 2 grams (4 tablets) by mouth 1 hour prior to dental cleaning/procedure.   clonazePAM (KLONOPIN) 0.5 MG tablet Take  1 tablet by mouth twice daily as needed for anxiety   cyclobenzaprine (FLEXERIL) 10 MG tablet Take 10 mg by mouth at bedtime as needed for muscle spasms.    denosumab (PROLIA) 60 MG/ML SOSY injection Inject 60 mg into the skin every 6 (six) months.   furosemide (LASIX) 40 MG tablet Take 1 tablet (40 mg total) by mouth daily.   gabapentin (NEURONTIN) 100 MG capsule Take 100 mg by mouth 2 (two) times daily.   levothyroxine (SYNTHROID) 75 MCG tablet Take 1 tablet by mouth once daily   meclizine (ANTIVERT) 25 MG tablet TAKE 1 TABLET BY MOUTH THREE TIMES DAILY AS NEEDED FOR DIZZINESS   metoprolol tartrate (LOPRESSOR) 50 MG tablet Take 1 tablet (50 mg total) by mouth 2 (two) times daily.   Nutritional Supplements (,FEEDING SUPPLEMENT, PROSOURCE PLUS) liquid Take 30 mLs by mouth 2 (two) times daily between meals.   nystatin (MYCOSTATIN/NYSTOP) powder Apply topically 2 (two) times daily as needed (skin irritation (under breasts)).   ondansetron (ZOFRAN) 4 MG tablet Take 1 tablet (4 mg total) by mouth every 8 (eight) hours as needed.   ondansetron (ZOFRAN-ODT) 4 MG disintegrating tablet Take 4 mg by mouth every 6  (six) hours as needed.   oxyCODONE (ROXICODONE) 5 MG/5ML solution Take 4 mLs (4 mg total) by mouth every 4 (four) hours as needed for moderate pain.   pantoprazole (PROTONIX) 40 MG tablet Take 1 tablet (40 mg total) by mouth daily.   polyethylene glycol (MIRALAX / GLYCOLAX) packet Take 17 g by mouth daily as needed for mild constipation.   potassium chloride SA (KLOR-CON M) 20 MEQ tablet Take 1 tablet (20 mEq total) by mouth daily.   promethazine (PHENERGAN) 25 MG tablet Take 1 tablet (25 mg total) by mouth 2 (two) times daily as needed for nausea or vomiting.   sodium chloride (OCEAN) 0.65 % SOLN nasal spray Place 1 spray into both nostrils as needed for congestion (nose bleeds).   valACYclovir (VALTREX) 500 MG tablet Take 500 mg by mouth daily as needed (breakouts).      Review of Systems      All other systems reviewed and are otherwise negative except as noted above.  Physical Exam    VS:  BP 110/68   Pulse 71   Ht '5\' 6"'$  (1.676 m)   Wt 114 lb 6.4 oz (51.9 kg)   SpO2 94%   BMI 18.46 kg/m  , BMI Body mass index is 18.46 kg/m.  Wt Readings from Last 3 Encounters:  06/09/22 114 lb 6.4 oz (51.9 kg)  05/02/22 118 lb 6 oz (53.7 kg)  04/02/22 120 lb (54.4 kg)     GEN: Frail, elderly women in no acute distress. HEENT: normal. Neck: Supple, no JVD, carotid bruits, or masses. Cardiac: RRR, 2/6 systolic murmur, rubs, or gallops. No clubbing, cyanosis, edema.  Radials/PT 2+ and equal bilaterally.  Respiratory:  Respirations regular and unlabored, clear to auscultation bilaterally. GI: Soft, nontender, nondistended. MS: No deformity or atrophy. Skin: Warm and dry, no rash. Neuro:  Strength and sensation are intact. Psych: Depressed affect   Assessment & Plan    Chronic diastolic heart failure -Euvolemic on exam today however is reporting extreme shortness of breath -Last echocardiogram reviewed with the patient -will plan to increase lasix to '40mg'$  BID for three days and then  return to usually dosage -pulmonary workup was negative per patient -may be a candidate for spirolactone or Farxiga/Jardiance but hesitant to start any new medications -she would  like to discuss any additional medication changes with Dr. 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 Stanford Breed  Signed, Elgie Collard, PA-C 06/09/2022, 5:15 PM [place]

## 2022-06-09 ENCOUNTER — Encounter: Payer: Self-pay | Admitting: Physician Assistant

## 2022-06-09 ENCOUNTER — Ambulatory Visit: Payer: Medicare Other | Attending: Physician Assistant | Admitting: Physician Assistant

## 2022-06-09 VITALS — BP 110/68 | HR 71 | Ht 66.0 in | Wt 114.4 lb

## 2022-06-09 DIAGNOSIS — R0602 Shortness of breath: Secondary | ICD-10-CM | POA: Insufficient documentation

## 2022-06-09 DIAGNOSIS — Z954 Presence of other heart-valve replacement: Secondary | ICD-10-CM

## 2022-06-09 DIAGNOSIS — I878 Other specified disorders of veins: Secondary | ICD-10-CM | POA: Insufficient documentation

## 2022-06-09 DIAGNOSIS — R0600 Dyspnea, unspecified: Secondary | ICD-10-CM | POA: Diagnosis not present

## 2022-06-09 DIAGNOSIS — Z9889 Other specified postprocedural states: Secondary | ICD-10-CM | POA: Diagnosis not present

## 2022-06-09 DIAGNOSIS — I340 Nonrheumatic mitral (valve) insufficiency: Secondary | ICD-10-CM | POA: Diagnosis not present

## 2022-06-09 DIAGNOSIS — G894 Chronic pain syndrome: Secondary | ICD-10-CM | POA: Diagnosis not present

## 2022-06-09 DIAGNOSIS — I10 Essential (primary) hypertension: Secondary | ICD-10-CM | POA: Insufficient documentation

## 2022-06-09 DIAGNOSIS — Z79891 Long term (current) use of opiate analgesic: Secondary | ICD-10-CM | POA: Diagnosis not present

## 2022-06-09 DIAGNOSIS — Z79899 Other long term (current) drug therapy: Secondary | ICD-10-CM | POA: Diagnosis not present

## 2022-06-09 NOTE — Patient Instructions (Signed)
Medication Instructions:  1.Increase lasix to 40 mg twice a day for the next 3 days then resume 40 mg daily dose 2.Increase potassium to 20 meq twice a day for the next 3 days then resume 20 meq daily dose *If you need a refill on your cardiac medications before your next appointment, please call your pharmacy*   Lab Work: BMP and BNP in 1 week If you have labs (blood work) drawn today and your tests are completely normal, you will receive your results only by: [place]a [place] (if you have MyChart) OR A paper copy in the mail If you have any lab test that is abnormal or we need to change your treatment, we will call you to review the results.   Follow-Up: At [facility], you and your health needs are our priority.  As part of our continuing mission to provide you with exceptional heart care, we have created designated Provider Care Teams.  These Care Teams include your primary Cardiologist (physician) and Advanced Practice Providers (APPs -  Physician Assistants and Nurse Practitioners) who all work together to provide you with the care you need, when you need it.  We recommend signing up for the patient portal called "MyChart".  Sign up information is provided on this After Visit Summary.  MyChart is used to connect with patients for Virtual Visits (Telemedicine).  Patients are able to view lab/test results, encounter notes, upcoming appointments, etc.  Non-urgent messages can be sent to your provider as well.   To learn more about what you can do with MyChart, go to [email or web address].    Your next appointment:   06/11/2022 at 2:00 PM  The format for your next appointment:   In Person  Provider:   Kirk Ruths, MD   Important Information About Sugar

## 2022-06-10 ENCOUNTER — Other Ambulatory Visit: Payer: Self-pay | Admitting: Family Medicine

## 2022-06-10 ENCOUNTER — Telehealth: Payer: Self-pay | Admitting: Pharmacist

## 2022-06-10 DIAGNOSIS — R110 Nausea: Secondary | ICD-10-CM

## 2022-06-10 NOTE — Progress Notes (Signed)
Chronic Care Management Pharmacy Assistant   Name: [name]  MRN: [id number] DOB: [birth year]   Reason for Encounter: Disease State - Hypertension Call     Recent office visits:  05/02/22 - [name], MD - Family Medicine - Hypothyroidism - Labs were ordered. No medication changes. Follow up in 6 months.   Recent consult visits:  06/09/22 Nicholes Rough, PA-C - Cardiology - Mitral Valve Repair - Labs were ordered. EKG ordered. Follow up as scheduled.   Hospital visits: 03/08/22 Medication Reconciliation was completed by comparing discharge summary, patient's EMR and Pharmacy list, and upon discussion with patient.  Admitted to the hospital on 03/08/22 due to Vertigo. Discharge date was 03/08/22. Discharged from [facility] .    New?Medications Started at [facility] Discharge:?? ondansetron (ZOFRAN-ODT) 4 mg disintegrating tablet   meclizine HCl (ANTIVERT) 25 mg tablet    Medication Changes at Hospital Discharge: None noted.   Medications Discontinued at Hospital Discharge: None noted.   Medications that remain the same after Hospital Discharge:??  All other medications will remain the same.    Medications: Outpatient Encounter Medications as of 06/10/2022  Medication Sig   acetaminophen (TYLENOL) 500 MG tablet Take 500 mg by mouth every 6 (six) hours as needed for moderate pain.    apixaban (ELIQUIS) 5 MG TABS tablet Take 1 tablet (5 mg total) by mouth 2 (two) times daily.   b complex vitamins tablet Take 1 tablet by mouth daily.   Calcium Carbonate Antacid (TUMS CHEWY BITES PO) Take 1 tablet by mouth daily as needed (reflux).    Calcium Citrate-Vitamin D (CALCIUM + D PO) Take 1 tablet by mouth daily.   Carboxymethylcellul-Glycerin (LUBRICATING EYE DROPS OP) Place 1 drop into both eyes daily as needed (dry eyes).   cephALEXin (KEFLEX) 500 MG capsule Take 2 grams (4 tablets) by mouth 1 hour prior to dental cleaning/procedure.   clonazePAM (KLONOPIN) 0.5 MG  tablet Take 1 tablet by mouth twice daily as needed for anxiety   cyclobenzaprine (FLEXERIL) 10 MG tablet Take 10 mg by mouth at bedtime as needed for muscle spasms.    denosumab (PROLIA) 60 MG/ML SOSY injection Inject 60 mg into the skin every 6 (six) months.   furosemide (LASIX) 40 MG tablet Take 1 tablet (40 mg total) by mouth daily.   gabapentin (NEURONTIN) 100 MG capsule Take 100 mg by mouth 2 (two) times daily.   levothyroxine (SYNTHROID) 75 MCG tablet Take 1 tablet by mouth once daily   meclizine (ANTIVERT) 25 MG tablet TAKE 1 TABLET BY MOUTH THREE TIMES DAILY AS NEEDED FOR DIZZINESS   metoprolol tartrate (LOPRESSOR) 50 MG tablet Take 1 tablet (50 mg total) by mouth 2 (two) times daily.   Nutritional Supplements (,FEEDING SUPPLEMENT, PROSOURCE PLUS) liquid Take 30 mLs by mouth 2 (two) times daily between meals.   nystatin (MYCOSTATIN/NYSTOP) powder Apply topically 2 (two) times daily as needed (skin irritation (under breasts)).   ondansetron (ZOFRAN) 4 MG tablet TAKE 1 TABLET BY MOUTH EVERY 8 HOURS AS NEEDED   ondansetron (ZOFRAN-ODT) 4 MG disintegrating tablet Take 4 mg by mouth every 6 (six) hours as needed.   oxyCODONE (ROXICODONE) 5 MG/5ML solution Take 4 mLs (4 mg total) by mouth every 4 (four) hours as needed for moderate pain.   pantoprazole (PROTONIX) 40 MG tablet Take 1 tablet (40 mg total) by mouth daily.   polyethylene glycol (MIRALAX / GLYCOLAX) packet Take 17 g by mouth daily as needed for mild constipation.  potassium chloride SA (KLOR-CON M) 20 MEQ tablet Take 1 tablet (20 mEq total) by mouth daily.   promethazine (PHENERGAN) 25 MG tablet Take 1 tablet (25 mg total) by mouth 2 (two) times daily as needed for nausea or vomiting.   sodium chloride (OCEAN) 0.65 % SOLN nasal spray Place 1 spray into both nostrils as needed for congestion (nose bleeds).   valACYclovir (VALTREX) 500 MG tablet Take 500 mg by mouth daily as needed (breakouts).    No facility-administered encounter  medications on file as of 06/10/2022.    Current antihypertensive regimen:  amLODipine (NORVASC) 5 MG tablet metoprolol tartrate (LOPRESSOR) 50 MG tablet furosemide (LASIX) 40 MG tablet  How often are you checking your Blood Pressure?  Patient reported checking blood pressures  Current home BP readings:     What recent interventions/DTPs have been made by any provider to improve Blood Pressure control since last CPP Visit:  Patient had no changes since last visit with CPP    Any recent hospitalizations or ED visits since last visit with CPP? Patient had a ED visit on 03/08/22 for Vertigo.    What diet changes have been made to improve Blood Pressure Control?  Patient reported    What exercise is being done to improve your Blood Pressure Control?   Patient reported   Adherence Review: Is the patient currently on ACE/ARB medication? No Does the patient have >5 day gap between last estimated fill dates? No    Care Gaps   AWV: done 10/10/21 Colonoscopy: done 02/05/09 DM Eye Exam: N/A DM Foot Exam: N/A Microalbumin: N/A HbgAIC: done 04/16/20 (5.6) DEXA: done 04/01/21 Mammogram: done 12/30/21     Star Rating Drugs: No Star Rating Drugs Noted.   Future Appointments  Date Time Provider [place]  06/11/2022  2:00 PM Lelon Perla, MD [facility] None  06/16/2022  4:00 PM [facility]  07/29/2022  2:00 PM [facility] DEXA RM 1 [facility] None  10/01/2022  1:00 PM [facility] CCM PHARMACIST [facility]  10/16/2022  8:45 AM [facility] HEALTH COACH [facility] [place] [place] Clinical Pharmacist Assistant  [phone]

## 2022-06-11 ENCOUNTER — Ambulatory Visit (INDEPENDENT_AMBULATORY_CARE_PROVIDER_SITE_OTHER): Payer: Medicare Other | Admitting: Cardiology

## 2022-06-11 ENCOUNTER — Encounter: Payer: Self-pay | Admitting: Cardiology

## 2022-06-11 ENCOUNTER — Other Ambulatory Visit: Payer: Self-pay | Admitting: Family Medicine

## 2022-06-11 VITALS — BP 113/68 | HR 67 | Ht 66.0 in | Wt 116.1 lb

## 2022-06-11 DIAGNOSIS — Z9889 Other specified postprocedural states: Secondary | ICD-10-CM

## 2022-06-11 DIAGNOSIS — I480 Paroxysmal atrial fibrillation: Secondary | ICD-10-CM

## 2022-06-11 DIAGNOSIS — R0602 Shortness of breath: Secondary | ICD-10-CM | POA: Diagnosis not present

## 2022-06-11 NOTE — Patient Instructions (Signed)
    Follow-Up: At [facility], you and your health needs are our priority.  As part of our continuing mission to provide you with exceptional heart care, we have created designated Provider Care Teams.  These Care Teams include your primary Cardiologist (physician) and Advanced Practice Providers (APPs -  Physician Assistants and Nurse Practitioners) who all work together to provide you with the care you need, when you need it.  We recommend signing up for the patient portal called "MyChart".  Sign up information is provided on this After Visit Summary.  MyChart is used to connect with patients for Virtual Visits (Telemedicine).  Patients are able to view lab/test results, encounter notes, upcoming appointments, etc.  Non-urgent messages can be sent to your provider as well.   To learn more about what you can do with MyChart, go to [email or web address].    Your next appointment:   3 month(s)  The format for your next appointment:   In Person  Provider:   Kirk Ruths, MD

## 2022-06-11 NOTE — Progress Notes (Signed)
HPI: Follow-up chronic dyspnea, previous mitral valve repair, paroxysmal atrial fibrillation.  Patient underwent minimally invasive mitral valve repair, complete maze procedure and sternotomy with repair of left ventricular free wall laceration July 2021.  Note preoperative catheterization May 2021 showed 20% right coronary artery and 20% distal LAD.  She has had difficulties with continuing dyspnea and chest pain based on reviewing records.  Most recent echocardiogram August 2023 showed normal LV function, flattened septum in systole consistent with RV pressure overload, moderate RV dysfunction, mild right ventricular enlargement, mild right atrial enlargement, status post mitral valve repair with no mitral stenosis and trace mitral regurgitation, mild aortic insufficiency.  Chest CT August 2023 showed right middle lobe and left upper lobe nodularity suggestive of atypical infection, nodule in the left major fissure.  proBNP June 03, 2022 1282 and creatinine 0.89.  Since last seen she complains of dyspnea on exertion which is chronic.  She also notes some dyspnea at rest.  She states her pedal edema has improved with higher dose Lasix.  She denies chest pain or syncope.  Current Outpatient Medications  Medication Sig Dispense Refill   acetaminophen (TYLENOL) 500 MG tablet Take 500 mg by mouth every 6 (six) hours as needed for moderate pain.      apixaban (ELIQUIS) 5 MG TABS tablet Take 1 tablet (5 mg total) by mouth 2 (two) times daily. 180 tablet 3   b complex vitamins tablet Take 1 tablet by mouth daily.     Calcium Carbonate Antacid (TUMS CHEWY BITES PO) Take 1 tablet by mouth daily as needed (reflux).      Calcium Citrate-Vitamin D (CALCIUM + D PO) Take 1 tablet by mouth daily.     Carboxymethylcellul-Glycerin (LUBRICATING EYE DROPS OP) Place 1 drop into both eyes daily as needed (dry eyes).     cephALEXin (KEFLEX) 500 MG capsule Take 2 grams (4 tablets) by mouth 1 hour prior to dental  cleaning/procedure. 4 capsule 2   clonazePAM (KLONOPIN) 0.5 MG tablet Take 1 tablet by mouth twice daily as needed for anxiety 30 tablet 0   cyclobenzaprine (FLEXERIL) 10 MG tablet Take 10 mg by mouth at bedtime as needed for muscle spasms.   5   denosumab (PROLIA) 60 MG/ML SOSY injection Inject 60 mg into the skin every 6 (six) months.     furosemide (LASIX) 40 MG tablet Take 1 tablet (40 mg total) by mouth daily. 30 tablet 1   gabapentin (NEURONTIN) 100 MG capsule Take 100 mg by mouth 2 (two) times daily.     levothyroxine (SYNTHROID) 75 MCG tablet Take 1 tablet by mouth once daily 90 tablet 0   meclizine (ANTIVERT) 25 MG tablet TAKE 1 TABLET BY MOUTH THREE TIMES DAILY AS NEEDED FOR DIZZINESS 30 tablet 0   metoprolol tartrate (LOPRESSOR) 50 MG tablet Take 1 tablet (50 mg total) by mouth 2 (two) times daily. 180 tablet 3   Nutritional Supplements (,FEEDING SUPPLEMENT, PROSOURCE PLUS) liquid Take 30 mLs by mouth 2 (two) times daily between meals. 887 mL 0   nystatin (MYCOSTATIN/NYSTOP) powder Apply topically 2 (two) times daily as needed (skin irritation (under breasts)). 15 g 0   ondansetron (ZOFRAN) 4 MG tablet TAKE 1 TABLET BY MOUTH EVERY 8 HOURS AS NEEDED 30 tablet 0   oxyCODONE (ROXICODONE) 5 MG/5ML solution Take 4 mLs (4 mg total) by mouth every 4 (four) hours as needed for moderate pain.  0   pantoprazole (PROTONIX) 40 MG tablet Take 1 tablet (40  mg total) by mouth daily. 30 tablet 3   polyethylene glycol (MIRALAX / GLYCOLAX) packet Take 17 g by mouth daily as needed for mild constipation.     potassium chloride SA (KLOR-CON M) 20 MEQ tablet Take 1 tablet (20 mEq total) by mouth daily. 30 tablet 1   promethazine (PHENERGAN) 25 MG tablet Take 1 tablet (25 mg total) by mouth 2 (two) times daily as needed for nausea or vomiting. 20 tablet 0   sodium chloride (OCEAN) 0.65 % SOLN nasal spray Place 1 spray into both nostrils as needed for congestion (nose bleeds).     valACYclovir (VALTREX) 500 MG  tablet Take 500 mg by mouth daily as needed (breakouts).      No current facility-administered medications for this visit.     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[place]) ~ 2003   "right"   Mitral regurgitation    Osteopenia 02/18/2012   Osteoporosis    PAF (paroxysmal atrial fibrillation) ([place]) 2012   Peripheral neuropathy 11/06/2011   PMDD (premenstrual dysphoric disorder)    PONV (postoperative nausea and vomiting)    nausea, vomiting, hives and dizziness    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/2020   Complete bilateral atrial lesion set using cryothermy and bipolar radiofrequency ablation with clipping of LA appendage via right mini-thoracotomy approach   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   Complex valvuloplasty including artificial Gore-tex neochord placement x12 with 75m Sorin Memo 4D ring annuloplasty   Seasonal allergies    Vaginal delivery    ONE NSVD   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    Past Surgical History:  Procedure Laterality Date   ANTERIOR CERVICAL DECOMP/DISCECTOMY FUSION  ~ 2003   BACK SURGERY     BREAST SURGERY     BREAST BIOPSY--RIGHT BENIGN   BUNIONECTOMY Bilateral    CARDIOVERSION N/A 01/26/2020   Procedure: CARDIOVERSION;  Surgeon: OGeralynn Rile MD;  [place]  Service: Cardiovascular;  Laterality: N/A;   COSMETIC SURGERY  2016   "back of my neck; related to earlier fusion"   [place] "several times"    [facility]    "removed bone protruding out of my forehead"   HARDWARE REMOVAL  2016   "related to neck OR"   INCONTINENCE SURGERY     IR THORACENTESIS ASP PLEURAL SPACE W/IMG GUIDE  02/16/2020   MINIMALLY INVASIVE MAZE PROCEDURE N/A 01/17/2020   Procedure: MINIMALLY INVASIVE MAZE PROCEDURE;  Surgeon: ORexene Alberts MD;  [place] [facility]  Service: Open Heart Surgery;  Laterality: N/A;   MITRAL VALVE REPAIR Right 01/17/2020   Procedure: MINIMALLY INVASIVE MITRAL VALVE REPAIR (MVR) USING MEMO 4D 32MM;  Surgeon: ORexene Alberts MD;  [place]  Service: Open Heart Surgery;  Laterality: Right;   POSTERIOR CERVICAL FUSION/FORAMINOTOMY  ~ 2008; 2015  RIGHT/LEFT HEART CATH AND CORONARY ANGIOGRAPHY N/A 12/02/2019   Procedure: RIGHT/LEFT HEART CATH AND CORONARY ANGIOGRAPHY;  Surgeon: Burnell Blanks, MD;  Location: [place] [facility];  Service: Cardiovascular;  Laterality: N/A;   SHOULDER ARTHROSCOPY W/ ROTATOR CUFF REPAIR Right 2012   SPINAL FUSION  06/2014 - 2018 X ?7   "scoliosis; my entire back"   TEE WITHOUT CARDIOVERSION N/A 11/21/2019   Procedure: TRANSESOPHAGEAL ECHOCARDIOGRAM (TEE);  Surgeon: Josue Hector, MD;  Location: [facility] ENDOSCOPY;  Service: Cardiovascular;  Laterality: N/A;   TEE WITHOUT CARDIOVERSION N/A 01/17/2020   Procedure: TRANSESOPHAGEAL ECHOCARDIOGRAM (TEE);  Surgeon: Rexene Alberts, MD;  Location: [place];  Service: Open Heart Surgery;  Laterality: N/A;   TEE WITHOUT CARDIOVERSION N/A 01/26/2020   Procedure: TRANSESOPHAGEAL ECHOCARDIOGRAM (TEE);  Surgeon: Geralynn Rile, MD;  Location: [place];  Service: Cardiovascular;  Laterality: N/A;   TUBAL LIGATION     VAGINAL HYSTERECTOMY     TVH    Social History   Socioeconomic History   Marital status: Divorced    Spouse name: none/divorced   Number of children: Not on file   Years of education: Not on file   Highest education level: 12th grade   Occupational History   Occupation: retired  Tobacco Use   Smoking status: Former    Packs/day: 0.10    Years: 14.00    Total pack years: 1.40    Types: Cigarettes    Quit date: 1980    Years since quitting: 43.9   Smokeless tobacco: Never   Tobacco comments:    03/31/2018 "quit ~ 1980; someday smoker when I did smoke; never addicted"  Vaping Use   Vaping Use: Never used  Substance and Sexual Activity   Alcohol use: Never   Drug use: Yes    Types: Oxycodone, Benzodiazepines    Comment: 03/31/2018 "for chronic neck and back pain", takes Klonopin at times.    Sexual activity: Not Currently  Other Topics Concern   Not on file  Social History Narrative   Lives by herself.    Social Determinants of Health   Financial Resource Strain: Low Risk  (10/10/2021)   Overall Financial Resource Strain (CARDIA)    Difficulty of Paying Living Expenses: Not hard at all  Food Insecurity: No Food Insecurity (10/10/2021)   Hunger Vital Sign    Worried About Running Out of Food in the Last Year: Never true    Ran Out of Food in the Last Year: Never true  Transportation Needs: No Transportation Needs (10/10/2021)   PRAPARE - Hydrologist (Medical): No    Lack of Transportation (Non-Medical): No  Physical Activity: Inactive (10/10/2021)   Exercise Vital Sign    Days of Exercise per Week: 0 days    Minutes of Exercise per Session: 0 min  Stress: No Stress Concern Present (10/10/2021)   [place]    Feeling of Stress : Not at all  Social Connections: Socially Isolated (10/10/2021)   Social Connection and Isolation Panel [NHANES]    Frequency of Communication with Friends and Family: Twice a week    Frequency of Social Gatherings with Friends and Family: Twice a week    Attends Religious Services: Never    Marine scientist or Organizations: No    Attends Archivist Meetings: Never    Marital  Status: Divorced  Human resources officer Violence: Not At Risk (10/10/2021)   Humiliation, Afraid,  Rape, and Kick questionnaire    Fear of Current or Ex-Partner: No    Emotionally Abused: No    Physically Abused: No    Sexually Abused: No    Family History  Problem Relation Age of Onset   Diabetes Father    Hyperlipidemia Sister    Heart disease Sister    Stroke Sister    Diabetes Brother    Hyperlipidemia Sister    Heart disease Sister    Arthritis Mother    Heart disease Mother    Uterine cancer Mother    Diabetes Brother    Heart Problems Brother     ROS: no fevers or chills, productive cough, hemoptysis, dysphasia, odynophagia, melena, hematochezia, dysuria, hematuria, rash, seizure activity, orthopnea, PND, pedal edema, claudication. Remaining systems are negative.  Physical Exam: Well-developed well-nourished in no acute distress.  Skin is warm and dry.  HEENT is normal.  Neck is supple.  Chest is clear to auscultation with normal expansion.  Cardiovascular exam is regular rate and rhythm.  Abdominal exam nontender or distended. No masses palpated. Extremities show no edema. neuro grossly intact  ECG-normal sinus rhythm at a rate of 67, no significant ST changes.  Personally reviewed  A/P  1 status post mitral valve repair for severe mitral regurgitation-most recent echocardiogram showed stable repair.  Continue SBE prophylaxis.  2 chronic dyspnea-she has had difficulties with this for quite some time based on reviewing records.  She does not appear to be volume overloaded on examination.  Will continue Lasix at present dose.  We discussed fluid restriction and low-sodium diet.  She previously declined spironolactone and SGLT2 inhibitor.  She has had multiple medication intolerances in the past as well.  Check potassium and renal function.  3 paroxysmal atrial fibrillation-continue metoprolol for rate control if atrial fibrillation recurs.  Continue apixaban.  She is in  sinus rhythm today.  4 history of nonobstructive coronary disease-she previously declined statin therapy.  5 history of depression-followed by primary care.  6 hypertension-patient's blood pressure is controlled.  Continue present medical regimen.  20 minutes spent prior to patient arrival reviewing previous history.  Kirk Ruths, MD

## 2022-06-11 NOTE — Telephone Encounter (Signed)
Clonazepam 0.5 LOV: 05/02/22 Last Refill:04/10/22 Upcoming appt: 10/01/22

## 2022-06-11 NOTE — Telephone Encounter (Signed)
Informed pt that her Refill was sent to pharmacy

## 2022-06-16 ENCOUNTER — Ambulatory Visit: Payer: Medicare Other | Attending: Cardiology

## 2022-06-16 DIAGNOSIS — R0602 Shortness of breath: Secondary | ICD-10-CM | POA: Diagnosis not present

## 2022-06-16 DIAGNOSIS — I340 Nonrheumatic mitral (valve) insufficiency: Secondary | ICD-10-CM | POA: Diagnosis not present

## 2022-06-16 DIAGNOSIS — Z79899 Other long term (current) drug therapy: Secondary | ICD-10-CM

## 2022-06-16 DIAGNOSIS — I878 Other specified disorders of veins: Secondary | ICD-10-CM | POA: Diagnosis not present

## 2022-06-16 DIAGNOSIS — I10 Essential (primary) hypertension: Secondary | ICD-10-CM

## 2022-06-16 DIAGNOSIS — Z9889 Other specified postprocedural states: Secondary | ICD-10-CM

## 2022-06-16 DIAGNOSIS — R0600 Dyspnea, unspecified: Secondary | ICD-10-CM

## 2022-06-17 LAB — BASIC METABOLIC PANEL
BUN/Creatinine Ratio: 34 — ABNORMAL HIGH (ref 12–28)
BUN: 30 mg/dL — ABNORMAL HIGH (ref 8–27)
CO2: 33 mmol/L — ABNORMAL HIGH (ref 20–29)
Calcium: 9.3 mg/dL (ref 8.7–10.3)
Chloride: 98 mmol/L (ref 96–106)
Creatinine, Ser: 0.88 mg/dL (ref 0.57–1.00)
Glucose: 96 mg/dL (ref 70–99)
Potassium: 4.5 mmol/L (ref 3.5–5.2)
Sodium: 144 mmol/L (ref 134–144)
eGFR: 68 mL/min/{1.73_m2} (ref >59)

## 2022-06-17 LAB — PRO B NATRIURETIC PEPTIDE: NT-Pro BNP: 1121 pg/mL — ABNORMAL HIGH (ref 0–738)

## 2022-06-18 ENCOUNTER — Telehealth: Payer: Self-pay | Admitting: Cardiology

## 2022-06-18 NOTE — Telephone Encounter (Signed)
Patient is requesting a call back to discuss lab results. 

## 2022-06-18 NOTE — Telephone Encounter (Signed)
Spoke with patient who reports SOB, but no change in it. She wants to know about blood work results and the condition of her heart. She stated she get nervous with worry.

## 2022-06-18 NOTE — Telephone Encounter (Signed)
Spoke with pt, Aware of dr Jacalyn Lefevre recommendations. She reports increased SOB today, advised the patient to take an extra 20 mg of lasix now, and if she is still SOB tomorrow morning with no change, she was given the okay to take an extra 20 mg of lasix in the morning.

## 2022-06-19 ENCOUNTER — Telehealth: Payer: Self-pay

## 2022-06-19 ENCOUNTER — Telehealth: Payer: Self-pay | Admitting: Pharmacist

## 2022-06-19 NOTE — Progress Notes (Signed)
Duplicate/error

## 2022-06-19 NOTE — Telephone Encounter (Signed)
[name] Iles dropped off forms to be signed . Placed in [name] to be signed folder

## 2022-06-21 ENCOUNTER — Telehealth: Payer: Self-pay | Admitting: Family Medicine

## 2022-06-21 DIAGNOSIS — Z885 Allergy status to narcotic agent status: Secondary | ICD-10-CM | POA: Diagnosis not present

## 2022-06-21 DIAGNOSIS — Z91041 Radiographic dye allergy status: Secondary | ICD-10-CM | POA: Diagnosis not present

## 2022-06-21 DIAGNOSIS — Z79899 Other long term (current) drug therapy: Secondary | ICD-10-CM | POA: Diagnosis not present

## 2022-06-21 DIAGNOSIS — R531 Weakness: Secondary | ICD-10-CM | POA: Diagnosis not present

## 2022-06-21 DIAGNOSIS — Z888 Allergy status to other drugs, medicaments and biological substances status: Secondary | ICD-10-CM | POA: Diagnosis not present

## 2022-06-21 DIAGNOSIS — Z1152 Encounter for screening for COVID-19: Secondary | ICD-10-CM | POA: Diagnosis not present

## 2022-06-21 DIAGNOSIS — I491 Atrial premature depolarization: Secondary | ICD-10-CM | POA: Diagnosis not present

## 2022-06-21 DIAGNOSIS — J984 Other disorders of lung: Secondary | ICD-10-CM | POA: Diagnosis not present

## 2022-06-21 DIAGNOSIS — R0600 Dyspnea, unspecified: Secondary | ICD-10-CM | POA: Diagnosis not present

## 2022-06-21 DIAGNOSIS — I2510 Atherosclerotic heart disease of native coronary artery without angina pectoris: Secondary | ICD-10-CM | POA: Diagnosis not present

## 2022-06-21 DIAGNOSIS — Z87891 Personal history of nicotine dependence: Secondary | ICD-10-CM | POA: Diagnosis not present

## 2022-06-21 DIAGNOSIS — Z882 Allergy status to sulfonamides status: Secondary | ICD-10-CM | POA: Diagnosis not present

## 2022-06-21 DIAGNOSIS — I517 Cardiomegaly: Secondary | ICD-10-CM | POA: Diagnosis not present

## 2022-06-21 DIAGNOSIS — Z79890 Hormone replacement therapy (postmenopausal): Secondary | ICD-10-CM | POA: Diagnosis not present

## 2022-06-21 DIAGNOSIS — Z91040 Latex allergy status: Secondary | ICD-10-CM | POA: Diagnosis not present

## 2022-06-21 NOTE — Telephone Encounter (Signed)
Received call from [employer] directing [place] to [facility]. They received a call from patient saying she was SOB, after evaluating her she did not appear in distress. She was able to walk around her home and carry on a conversation.  Pulse 68 Blood Pressure 110/60 Pox 95% on RA They did note slight crackles in Right LL She did appear anxious and she endorsed low energy and low appetite. She has been advised to rest, increase fluids, take Mucinex 400 mg bid, take deep breaths and seek care or report if symptoms worsen.

## 2022-06-22 DIAGNOSIS — J984 Other disorders of lung: Secondary | ICD-10-CM | POA: Diagnosis not present

## 2022-06-22 DIAGNOSIS — I2510 Atherosclerotic heart disease of native coronary artery without angina pectoris: Secondary | ICD-10-CM | POA: Diagnosis not present

## 2022-06-22 DIAGNOSIS — I517 Cardiomegaly: Secondary | ICD-10-CM | POA: Diagnosis not present

## 2022-06-23 ENCOUNTER — Telehealth: Payer: Self-pay | Admitting: Family Medicine

## 2022-06-23 DIAGNOSIS — G629 Polyneuropathy, unspecified: Secondary | ICD-10-CM | POA: Diagnosis not present

## 2022-06-23 DIAGNOSIS — G243 Spasmodic torticollis: Secondary | ICD-10-CM | POA: Diagnosis not present

## 2022-06-23 NOTE — Telephone Encounter (Signed)
Pt went to ER

## 2022-06-23 NOTE — Telephone Encounter (Signed)
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 Additional Comment Caller called earlier to them and stated she was having shortness of breath but she was not when evaluated. She reported patient had a little bit of possible crackling in her lower right lobe and her blood pressure was a tiny bit low. She is on Lasix. No fever. Heartrate stable and oxygen perfect on room air. No cough. Fatigued and decreased appetite. They did advise her to go to the ER if she experienced any other symptoms but they wanted to make provider aware to see if there are any other recommendations. Disp. Time Disposition Final User 06/21/2022 6:34:55 PM Send to [place] 06/21/2022 6:36:40 PM Paged On Call back to Call [place], [place] 06/21/2022 6:41:15 PM Page Completed Yes Lennie Muckle Paging DoctorName Phone DateTime Result/Outcome Message Type Notes Penni Homans - MD [phone] 06/21/2022 6:36:40 PM Paged On Call Back to Call Center Doctor Paged This is Noreene Filbert with the call center, we have a page for you. Please call [phone]. Thank you! Penni Homans - MD 06/21/2022 6:41:04 PM Spoke with On Call - General Message Result On call will return call. Call Closed By: Lennie Muckle Transaction Date/Time: 06/21/2022 6:28:42 PM (ET)

## 2022-06-24 NOTE — Telephone Encounter (Signed)
Form completed and returned to [place] 

## 2022-06-24 NOTE — Telephone Encounter (Signed)
Forms faxed and placed in scan  

## 2022-07-02 ENCOUNTER — Telehealth: Payer: Self-pay | Admitting: Cardiology

## 2022-07-02 MED ORDER — APIXABAN 5 MG PO TABS: 5.0000 mg | ORAL_TABLET | Freq: Two times a day (BID) | ORAL | 3 refills | Status: DC

## 2022-07-02 NOTE — Telephone Encounter (Signed)
Pt c/o medication issue:  1. Name of Medication:  apixaban (ELIQUIS) 5 MG TABS tablet  2. How are you currently taking this medication (dosage and times per day)?   3. Are you having a reaction (difficulty breathing--STAT)?   4. What is your medication issue?  Patient would like assistance with completing a patient assistance form for Eliquis.

## 2022-07-02 NOTE — Telephone Encounter (Signed)
Called pt and she states that she was calling to check the status of her pt assistance forms to see if we were notified from the company.  She called to let us know that she spoke with [facility] they told her that they "had everything they needed".  She will have to meet her insurance deductible. They will let her know if there is anything else they need from her.

## 2022-07-09 ENCOUNTER — Ambulatory Visit: Payer: PRIVATE HEALTH INSURANCE | Admitting: Family Medicine

## 2022-07-14 ENCOUNTER — Ambulatory Visit (INDEPENDENT_AMBULATORY_CARE_PROVIDER_SITE_OTHER): Payer: Medicare Other | Admitting: Family Medicine

## 2022-07-14 ENCOUNTER — Encounter: Payer: Self-pay | Admitting: Family Medicine

## 2022-07-14 VITALS — BP 116/70 | HR 72 | Temp 98.9°F | Resp 19 | Ht 66.0 in | Wt 117.2 lb

## 2022-07-14 DIAGNOSIS — R5381 Other malaise: Secondary | ICD-10-CM

## 2022-07-14 DIAGNOSIS — H6993 Unspecified Eustachian tube disorder, bilateral: Secondary | ICD-10-CM | POA: Diagnosis not present

## 2022-07-14 DIAGNOSIS — R1310 Dysphagia, unspecified: Secondary | ICD-10-CM | POA: Diagnosis not present

## 2022-07-14 DIAGNOSIS — R0602 Shortness of breath: Secondary | ICD-10-CM | POA: Diagnosis not present

## 2022-07-14 DIAGNOSIS — G609 Hereditary and idiopathic neuropathy, unspecified: Secondary | ICD-10-CM

## 2022-07-14 MED ORDER — FLUTICASONE PROPIONATE 50 MCG/ACT NA SUSP: 2.0000 | Freq: Every day | NASAL | 6 refills | Status: DC

## 2022-07-14 NOTE — Patient Instructions (Signed)
Follow up as needed or as scheduled Please reach out to Dr Stanford Breed and Dr Erin Fulling and let them know your concerns.  They need to know that things aren't working and deserve a chance to make things right START the Fluticasone (Flonase) daily to improve ear fullness I ordered home health so they should reach out to you to schedule to assess your swallowing Call with any questions or concerns Hang in there!!

## 2022-07-14 NOTE — Progress Notes (Unsigned)
   Subjective:    Patient ID: [name], female    DOB: Oct 11, 1945, 77 y.o.   MRN: [id number]  HPI ER f/u- pt went to [facility] ER on 12/16 for SOB.  CXR showed bilateral atelectasis.  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  Troponin was normal.  Remainder of labs unremarkable.    Pt is tearful in office today, 'it feels like I'm just going in circles'.  'i don't know what's going on with any of my doctors'.  'i'm stressed bc I don't know what my problem is'.  Pt reports she doesn't have the energy to run from doctor to doctor.    Shortness of breath- 'all the time'.  Pt reports she has difficulty at rest and w/ minimal exertion.  'it's like I can't get enough air in my lungs'.  Has not followed up w/ Dr Erin Fulling.  Pt reports botox in her neck did not work.  It made pain worse.  She saw neurosurg for this.  Bilateral ear fullness.  Pt has been using mineral oil and peroxide in her ears w/o relief.  Sees ENT.  Has been told she has eustachian tube dysfxn.  Not currently on any allergy medication.    Difficulty swallowing.  Pt reports if she didn't have water nearby she 'would've choked to death'.  She had a swallow study done in September   Review of Systems For ROS see HPI     Objective:   Physical Exam        Assessment & Plan:

## 2022-07-15 DIAGNOSIS — H6993 Unspecified Eustachian tube disorder, bilateral: Secondary | ICD-10-CM | POA: Insufficient documentation

## 2022-07-15 DIAGNOSIS — R1310 Dysphagia, unspecified: Secondary | ICD-10-CM | POA: Insufficient documentation

## 2022-07-15 NOTE — Assessment & Plan Note (Signed)
Ongoing issue for pt.  Had swallow study in September that was largely unremarkable, but states that she continues to have difficulty.  She is fearful she is going to 'choke to death' alone at home.  Will refer for home health to assess and work with her in regards to swallowing safety.

## 2022-07-15 NOTE — Assessment & Plan Note (Signed)
Ongoing issue for pt.  She has both a pulmonologist and a cardiologist but has not discussed this w/ either of them.  She states she is short of breath at all times, even at rest.  She is very frustrated.  I told her she needs to address her concerns with the specialists that are treating these issues and give them a chance to change the plan that isn't working.  Encouraged her to contact both Dr Erin Fulling and Dr Stanford Breed to review her sxs and discuss next steps.

## 2022-07-15 NOTE — Assessment & Plan Note (Signed)
Ongoing issue.  Sees Neurosurg who recommended starting B complex and methyl folate vitamins from [employer].  She wanted to make sure this was ok prior to ordering.  Told her this would be fine.

## 2022-07-15 NOTE — Assessment & Plan Note (Signed)
New to provider, ongoing for pt.  She reports she has seen ENT multiple times and told that she has eustachian tube dysfxn.  She was told to use Afrin for a few days when sxs are severe.  Rather than using PRN Afrin, will start Flonase daily to try and minimize sxs.  Pt expressed understanding and is in agreement w/ plan.

## 2022-07-15 NOTE — Assessment & Plan Note (Signed)
Ongoing issue for pt.  She lives alone.  Has difficulty getting to appts.  Very frail.  Her anxiety has always been a big problem for her and she is again tearful in office stating she doesn't want to die.  I asked her if she was able to separate her physical symptoms from her dramatic presentation and tell me specifically what is wrong so we can come up w/ a plan going forward.  She is very frustrated w/ her lack of progress and feels that 'no one knows what's wrong with me'.  I told her it is very important to list her symptoms and present them in a straight forward manner so that her care team doesn't get lost or distracted.  Today I ordered home health to hopefully assist w/ swallowing and a nurse to evaluate.  I spent 50 minutes w/ pt today.

## 2022-07-18 ENCOUNTER — Other Ambulatory Visit: Payer: Self-pay

## 2022-07-18 DIAGNOSIS — R1310 Dysphagia, unspecified: Secondary | ICD-10-CM | POA: Diagnosis not present

## 2022-07-18 DIAGNOSIS — I5032 Chronic diastolic (congestive) heart failure: Secondary | ICD-10-CM

## 2022-07-18 DIAGNOSIS — Z8679 Personal history of other diseases of the circulatory system: Secondary | ICD-10-CM

## 2022-07-18 DIAGNOSIS — R0602 Shortness of breath: Secondary | ICD-10-CM

## 2022-07-18 DIAGNOSIS — J449 Chronic obstructive pulmonary disease, unspecified: Secondary | ICD-10-CM | POA: Diagnosis not present

## 2022-07-18 DIAGNOSIS — Z79899 Other long term (current) drug therapy: Secondary | ICD-10-CM

## 2022-07-18 DIAGNOSIS — Z87442 Personal history of urinary calculi: Secondary | ICD-10-CM | POA: Diagnosis not present

## 2022-07-18 DIAGNOSIS — I878 Other specified disorders of veins: Secondary | ICD-10-CM | POA: Diagnosis not present

## 2022-07-18 DIAGNOSIS — G243 Spasmodic torticollis: Secondary | ICD-10-CM | POA: Diagnosis not present

## 2022-07-18 DIAGNOSIS — I110 Hypertensive heart disease with heart failure: Secondary | ICD-10-CM | POA: Diagnosis not present

## 2022-07-18 DIAGNOSIS — F3281 Premenstrual dysphoric disorder: Secondary | ICD-10-CM | POA: Diagnosis not present

## 2022-07-18 DIAGNOSIS — G629 Polyneuropathy, unspecified: Secondary | ICD-10-CM | POA: Diagnosis not present

## 2022-07-18 DIAGNOSIS — M8580 Other specified disorders of bone density and structure, unspecified site: Secondary | ICD-10-CM | POA: Diagnosis not present

## 2022-07-18 DIAGNOSIS — R7989 Other specified abnormal findings of blood chemistry: Secondary | ICD-10-CM

## 2022-07-18 DIAGNOSIS — Z48812 Encounter for surgical aftercare following surgery on the circulatory system: Secondary | ICD-10-CM | POA: Diagnosis not present

## 2022-07-18 DIAGNOSIS — H269 Unspecified cataract: Secondary | ICD-10-CM | POA: Diagnosis not present

## 2022-07-18 DIAGNOSIS — M549 Dorsalgia, unspecified: Secondary | ICD-10-CM | POA: Diagnosis not present

## 2022-07-18 DIAGNOSIS — F419 Anxiety disorder, unspecified: Secondary | ICD-10-CM | POA: Diagnosis not present

## 2022-07-18 DIAGNOSIS — J302 Other seasonal allergic rhinitis: Secondary | ICD-10-CM | POA: Diagnosis not present

## 2022-07-18 DIAGNOSIS — I480 Paroxysmal atrial fibrillation: Secondary | ICD-10-CM | POA: Diagnosis not present

## 2022-07-18 DIAGNOSIS — F339 Major depressive disorder, recurrent, unspecified: Secondary | ICD-10-CM | POA: Diagnosis not present

## 2022-07-18 DIAGNOSIS — Z9889 Other specified postprocedural states: Secondary | ICD-10-CM

## 2022-07-18 DIAGNOSIS — K219 Gastro-esophageal reflux disease without esophagitis: Secondary | ICD-10-CM | POA: Diagnosis not present

## 2022-07-18 DIAGNOSIS — G609 Hereditary and idiopathic neuropathy, unspecified: Secondary | ICD-10-CM | POA: Diagnosis not present

## 2022-07-18 DIAGNOSIS — H9190 Unspecified hearing loss, unspecified ear: Secondary | ICD-10-CM | POA: Diagnosis not present

## 2022-07-18 DIAGNOSIS — G8929 Other chronic pain: Secondary | ICD-10-CM | POA: Diagnosis not present

## 2022-07-18 DIAGNOSIS — M1990 Unspecified osteoarthritis, unspecified site: Secondary | ICD-10-CM | POA: Diagnosis not present

## 2022-07-18 DIAGNOSIS — I2510 Atherosclerotic heart disease of native coronary artery without angina pectoris: Secondary | ICD-10-CM | POA: Diagnosis not present

## 2022-07-18 DIAGNOSIS — Z87891 Personal history of nicotine dependence: Secondary | ICD-10-CM | POA: Diagnosis not present

## 2022-07-18 DIAGNOSIS — M810 Age-related osteoporosis without current pathological fracture: Secondary | ICD-10-CM | POA: Diagnosis not present

## 2022-07-18 MED ORDER — POTASSIUM CHLORIDE CRYS ER 20 MEQ PO TBCR: 20.0000 meq | EXTENDED_RELEASE_TABLET | Freq: Every day | ORAL | 3 refills | Status: DC

## 2022-07-21 ENCOUNTER — Telehealth: Payer: Self-pay

## 2022-07-21 ENCOUNTER — Telehealth: Payer: Self-pay | Admitting: Family Medicine

## 2022-07-21 DIAGNOSIS — F419 Anxiety disorder, unspecified: Secondary | ICD-10-CM | POA: Diagnosis not present

## 2022-07-21 DIAGNOSIS — Z48812 Encounter for surgical aftercare following surgery on the circulatory system: Secondary | ICD-10-CM | POA: Diagnosis not present

## 2022-07-21 DIAGNOSIS — M1990 Unspecified osteoarthritis, unspecified site: Secondary | ICD-10-CM | POA: Diagnosis not present

## 2022-07-21 DIAGNOSIS — K219 Gastro-esophageal reflux disease without esophagitis: Secondary | ICD-10-CM | POA: Diagnosis not present

## 2022-07-21 DIAGNOSIS — M8580 Other specified disorders of bone density and structure, unspecified site: Secondary | ICD-10-CM | POA: Diagnosis not present

## 2022-07-21 DIAGNOSIS — I480 Paroxysmal atrial fibrillation: Secondary | ICD-10-CM | POA: Diagnosis not present

## 2022-07-21 NOTE — Telephone Encounter (Signed)
[facility] sent forms to be signed . Placed in [name] to be signed folder

## 2022-07-21 NOTE — Telephone Encounter (Signed)
Signed and returned to [facility] 

## 2022-07-21 NOTE — Telephone Encounter (Signed)
Caller name: [place]?: Yes  Call back number: [phone]  Provider they see: [name], MD  Reason for call: Verbal order: speech therapy once a week for 6 weeks.

## 2022-07-21 NOTE — Telephone Encounter (Signed)
Forms faxed and placed in scan  

## 2022-07-22 ENCOUNTER — Telehealth: Payer: Self-pay | Admitting: Cardiology

## 2022-07-22 NOTE — Telephone Encounter (Signed)
Pt called with SOB , she was seen in [facility] ER 2 weeks ago   labs were stable though Pro BNP was elevated.  This week she has increased her lasix to 60 mg daily for last 3 days. Her SOB is still present  she is to see pulmonary on Thursday.   She has appt in Feb with [place].    Her wt is down 2 lbs from when she saw her PCP , she may need to be seen in the office sooner than her appt.   Will defer to Dr. Stanford Breed.  I reassured her the echo was good for the valve in August.  She was anxious and not understanding why she is SOB.

## 2022-07-22 NOTE — Telephone Encounter (Signed)
LMTCB.

## 2022-07-22 NOTE — Telephone Encounter (Signed)
Informed Sonia Baller that it is ok to give speech therapy once a week x 6 weeks

## 2022-07-22 NOTE — Telephone Encounter (Signed)
Ok for verbal orders ?

## 2022-07-22 NOTE — Telephone Encounter (Signed)
Is it ok to give verbal order for speech therapy once a week

## 2022-07-22 NOTE — Telephone Encounter (Signed)
Pt c/o swelling: STAT is pt has developed SOB within 24 hours  How much weight have you gained and in what time span? No  If swelling, where is the swelling located? Around ankles and feet   Are you currently taking a fluid pill? Yes   Are you currently SOB? Yes, but she says she has SOB typically for awhile now.   Do you have a log of your daily weights (if so, list)? No  Have you gained 3 pounds in a day or 5 pounds in a week? No  Have you traveled recently? No.

## 2022-07-23 ENCOUNTER — Telehealth: Payer: Self-pay | Admitting: Family Medicine

## 2022-07-23 NOTE — Telephone Encounter (Signed)
Caller name: [place]   On DPR?: Yes  Call back number: [phone]  Provider they see: [name], MD  Reason for call: Wenda Low states SN was missed today

## 2022-07-23 NOTE — Telephone Encounter (Signed)
Patient called back, advised of upcoming visit.   Patient scheduled on 01/22 with Laurann Montana, NP at [facility].   Patient verbalized understanding, aware of visit time/date and location.

## 2022-07-23 NOTE — Telephone Encounter (Signed)
Attempted to contact patient to get her scheduled for an APP visit.   Left call back number.

## 2022-07-24 ENCOUNTER — Telehealth: Payer: Self-pay | Admitting: Pulmonary Disease

## 2022-07-24 ENCOUNTER — Ambulatory Visit (INDEPENDENT_AMBULATORY_CARE_PROVIDER_SITE_OTHER): Payer: Medicare Other | Admitting: Pulmonary Disease

## 2022-07-24 ENCOUNTER — Encounter: Payer: Self-pay | Admitting: Pulmonary Disease

## 2022-07-24 VITALS — BP 126/74 | HR 56 | Ht 66.0 in | Wt 117.0 lb

## 2022-07-24 DIAGNOSIS — J479 Bronchiectasis, uncomplicated: Secondary | ICD-10-CM | POA: Diagnosis not present

## 2022-07-24 MED ORDER — YUPELRI 175 MCG/3ML IN SOLN: 175.0000 ug | Freq: Every day | RESPIRATORY_TRACT | 0 refills | Status: DC

## 2022-07-24 NOTE — Progress Notes (Signed)
Synopsis: Referred in August 2023 for abnormal CT Chest scan  Subjective:   PATIENT ID: [name] GENDER: female DOB: 11/28/1945, MRN: [id number]  HPI  Chief Complaint  Patient presents with   Follow-up    F/U on SOB. States she has been struggling with increased SOB over the past few months. Was advised by PCP to follow up. Still having issues with swallowing.    [name] is a 77 year old woman with history of mitral regurgitation s/p minimally invassive MV repair and MAZE procedure complicated by LV free wall rupture 10/2019 and scoliosis with 7 spinal surgeries in 2015 who returns to pulmonary clinic for shortness of breath.   She continues to have shortness of breath and she feels it is getting worse. She did not tolerate Stiolto inhaler after last visit due to feeling like she had mucous in her throat. She continues to have issues with swallowing and has been referred to speech therapy by her primary care team.  OV 04/02/22 She did not tolerate stiolto after last visit as she reports choking issues after using it.  She had modified barium swallow 03/11/22 with no overt issues of dysphagia or aspiration risk. She reported taking TUMS to the speech therapist for chest pains with relief. She did start pantoprazole after last visit which she reports some improvement in her reflux.  She continues to have significant chest, neck, shoulder and back pains.   She is meeting with her surgical team at [place] and is considering botox injections in her neck.  OV 03/05/22 She reports the dyspnea can occur at rest or with exertion. She has wheezing at night sometimes. She complains of sharp chest pains that are located centrally and goes between her shoulder blades. She has terrible heartburn. She does report trouble swallowing her food/drink. She is using oxycodone every 4 hours for chronic pain due to her neck and back. She has cervical, thoracic and lumbar spine hardware in place.   PFTs  2022 showed normal spirometry but evidence of air trapping and significant bronchodilator response.   She has not noted much benefit from trying albuterol inhaler with her shortness of breath.   She is a former smoker, quit in 1980. She lives alone and is accompanied by her sister today. She is having more difficulty with her ADLs.   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   "right"   Mitral regurgitation    Osteopenia 02/18/2012   Osteoporosis    PAF (paroxysmal atrial fibrillation) ([place]) 2012   Peripheral neuropathy 11/06/2011   PMDD (premenstrual dysphoric disorder)    PONV (postoperative nausea and vomiting)    nausea, vomiting, hives and dizziness    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/2020   Complete bilateral atrial lesion set using cryothermy and bipolar radiofrequency ablation with clipping of LA appendage via right mini-thoracotomy approach  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   Complex valvuloplasty including artificial Gore-tex neochord placement x12 with 31m Sorin Memo 4D ring annuloplasty   Seasonal allergies    Vaginal delivery    ONE NSVD   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     Family History  Problem Relation Age of Onset   Diabetes Father    Hyperlipidemia Sister    Heart disease Sister    Stroke Sister    Diabetes Brother    Hyperlipidemia Sister    Heart disease Sister    Arthritis Mother    Heart disease Mother    Uterine cancer Mother     Diabetes Brother    Heart Problems Brother      Social History   Socioeconomic History   Marital status: Divorced    Spouse name: none/divorced   Number of children: Not on file   Years of education: Not on file   Highest education level: 12th grade  Occupational History   Occupation: retired  Tobacco Use   Smoking status: Former    Packs/day: 0.10    Years: 14.00    Total pack years: 1.40    Types: Cigarettes    Quit date: 1980    Years since quitting: 44.0   Smokeless tobacco: Never   Tobacco comments:    03/31/2018 "quit ~ 1980; someday smoker when I did smoke; never addicted"  Vaping Use   Vaping Use: Never used  Substance and Sexual Activity   Alcohol use: Never   Drug use: Yes    Types: Oxycodone, Benzodiazepines    Comment: 03/31/2018 "for chronic neck and back pain", takes Klonopin at times.    Sexual activity: Not Currently  Other Topics Concern   Not on file  Social History Narrative   Lives by herself.    Social Determinants of Health   Financial Resource Strain: Low Risk  (10/10/2021)   Overall Financial Resource Strain (CARDIA)    Difficulty of Paying Living Expenses: Not hard at all  Food Insecurity: No Food Insecurity (10/10/2021)   Hunger Vital Sign    Worried About Running Out of Food in the Last Year: Never true    Ran Out of Food in the Last Year: Never true  Transportation Needs: No Transportation Needs (10/10/2021)   PRAPARE - THydrologist(Medical): No    Lack of Transportation (Non-Medical): No  Physical Activity: Inactive (10/10/2021)   Exercise Vital Sign    Days of Exercise per Week: 0 days    Minutes of Exercise per Session: 0 min  Stress: No Stress Concern Present (10/10/2021)   [place]   Feeling of Stress : Not at all  Social Connections: Socially Isolated (10/10/2021)   Social Connection and Isolation Panel [NHANES]    Frequency of Communication  with Friends and Family: Twice a week    Frequency of Social Gatherings with Friends and Family: Twice a week    Attends Religious Services: Never    AMarine scientistor Organizations: No    Attends CArchivistMeetings: Never    Marital Status: Divorced  IHuman resources officerViolence: Not At Risk (10/10/2021)   Humiliation, Afraid, Rape, and Kick questionnaire    Fear of Current or Ex-Partner: No    Emotionally Abused: No    Physically Abused: No    Sexually Abused: No     Allergies  Allergen Reactions   Buprenorphine Nausea Only and Other (See  Comments)    sedation and adhesive reaction Other reaction(s): nausea, sedation and adhesive reaction Other reaction(s): nausea, sedation and adhesive reaction, Other (See Comments), Other (See Comments) sedation and adhesive reaction Other reaction(s): nausea, sedation and adhesive reaction sedation and adhesive reaction Other reaction(s): nausea, sedation and adhesive reaction sedation and adhesive reaction Other reaction(s): nausea, sedation and adhesive reaction sedation and adhesive reaction Other reaction(s): nausea, sedation and adhesive reaction sedation and adhesive reaction Other reaction(s): nausea, sedation and adhesive reaction sedation and adhesive reaction Other reaction(s): nausea, sedation and adhesive reaction   Erythromycin Nausea And Vomiting and Other (See Comments)    Dizziness  Other reaction(s): Other (See Comments) Dizziness  unknown unknown Dizziness  BURNING ALL OVER  BURNING ALL OVER  unknown Dizziness  BURNING ALL OVER    Iodinated Contrast Media Hives, Nausea Only and Rash    unknown unknown unknown   Latex Palpitations, Other (See Comments) and Rash     hives and sores Other reaction(s): hives and sores Other reaction(s):  hives and sores, Other (See Comments), Other (See Comments)  hives and sores Other reaction(s): hives and sores hives hives  hives and sores Other reaction(s):  hives and sores hives Sores  hives and sores Other reaction(s): hives and sores hives Sores  hives and sores Other reaction(s): hives and sores hives  hives and sores Other reaction(s): hives and sores hives Sores  hives and sores Other reaction(s): hives and sores   Other Other (See Comments)    CAN NOT TAKE , aLPRAZOLAM, OR ELAVIL DUE TO AFIB FOR ANXIETY  IV CONTRAST /"DYE". Other reaction(s): hives and sores Other reaction(s): Other (See Comments), Other (See Comments) CAN NOT TAKE , aLPRAZOLAM, OR ELAVIL DUE TO AFIB FOR ANXIETY  IV CONTRAST /"DYE". Other reaction(s): hives and sores IV CONTRAST /"DYE". CAN NOT TAKE , aLPRAZOLAM, OR ELAVIL DUE TO AFIB FOR ANXIETY  IV CONTRAST /"DYE". Other reaction(s): hives and sores Patient states that every time she has anesthesia, she wakes up vomiting, has chills, blood pressure drops CAN NOT TAKE , aLPRAZOLAM, OR ELAVIL DUE TO AFIB FOR ANXIETY  IV CONTRAST /"DYE". Other reaction(s): hives and sores IV CONTRAST /"DYE". Patient states that every time she has anesthesia, she wakes up vomiting, has chills, blood pressure drops CAN NOT TAKE , aLPRAZOLAM, OR ELAVIL DUE TO AFIB FOR ANXIETY  IV CONTRAST /"DYE". Other reaction(s): hives and sores IV CONTRAST /"DYE". CAN NOT TAKE , aLPRAZOLAM, OR ELAVIL DUE TO AFIB FOR ANXIETY  IV CONTRAST /"DYE". Other reaction(s): hives and sores Patient states that every time she has anesthesia, she wakes up vomiting, has chills, blood pressure drops CAN NOT TAKE , aLPRAZOLAM, OR ELAVIL DUE TO AFIB FOR ANXIETY  IV CONTRAST /"DYE". Other reaction(s): hives and sores   Tape Rash, Other (See Comments) and Itching    Heart monitor stickers must be rotated in order to prevent rash Other reaction(s): hives and sores Adhesive on EKG tabs=BURNS SKIN**PAPER TAPE OK** sores Adhesive on EKG tabs. Per Pt, Ok w/Paper Tape Adhesive on EKG tabs=BURNS SKIN**PAPER TAPE OK** sores Other reaction(s): Other  (See Comments) Heart monitor stickers must be rotated in order to prevent rash Adhesive on EKG tabs=BURNS SKIN**PAPER TAPE OK** sores   Ciprofloxacin Other (See Comments)    Other reaction(s): Other (See Comments) Burning all over Burning all over Burning all over   Duloxetine Hcl     Severe diarrhea and upset stomach Other reaction(s): Other (See Comments), Unknown Severe diarrhea and upset stomach Severe diarrhea and  upset stomach Severe diarrhea and upset stomach   Erythromycin Base     Other reaction(s): Other (See Comments), Unknown   Hydrocodone Other (See Comments)    Sedation,dizziness, and nausea Other reaction(s): sedation and nausea Other reaction(s): sedation and nausea Other reaction(s): Other (See Comments), Other (See Comments), sedation and nausea Sedation,dizziness, and nausea Other reaction(s): sedation and nausea Other reaction(s): sedation and nausea Sedation,dizziness, and nausea Other reaction(s): sedation and nausea Other reaction(s): sedation and nausea Sedation,dizziness, and nausea Other reaction(s): sedation and nausea Other reaction(s): sedation and nausea Sedation,dizziness, and nausea Other reaction(s): sedation and nausea Other reaction(s): sedation and nausea Sedation,dizziness, and nausea Other reaction(s): sedation and nausea Other reaction(s): sedation and nausea Sedation,dizziness, and nausea Other reaction(s): sedation and nausea Other reaction(s): sedation and nausea   Hydromorphone Other (See Comments)    Sedation,dizziness, and nausea Other reaction(s): sedation and nausea Other reaction(s): sedation and nausea Other reaction(s): Other (See Comments), Other (See Comments), sedation and nausea Sedation,dizziness, and nausea Other reaction(s): sedation and nausea Other reaction(s): sedation and nausea Sedation,dizziness, and nausea Other reaction(s): sedation and nausea Other reaction(s): sedation and nausea Sedation,dizziness,  and nausea Other reaction(s): sedation and nausea Other reaction(s): sedation and nausea Sedation,dizziness, and nausea Other reaction(s): sedation and nausea Other reaction(s): sedation and nausea Sedation,dizziness, and nausea Other reaction(s): sedation and nausea Other reaction(s): sedation and nausea Sedation,dizziness, and nausea Other reaction(s): sedation and nausea Other reaction(s): sedation and nausea   Iodine     unknown Other reaction(s): Other (See Comments) unknown unknown unknown unknown unknown unknown   Levofloxacin Nausea And Vomiting and Nausea Only   Metrizamide Hives   Nsaids     CANNOT TAKE PER CARDIOLOGIST DUE TO AFIB    Nucynta [Tapentadol Hcl]     nausea and sedation   Oxycodone Other (See Comments)    Delusions (intolerance)  PILLS ONLY sedation, dizziness, nausea,  and itching Other reaction(s): sedation and nausea Other reaction(s): sedation and nausea Other reaction(s): Hallucinations, Other Other reaction(s): Other (See Comments), sedation and nausea Delusions (intolerance)  PILLS ONLY sedation, dizziness, nausea,  and itching Other reaction(s): sedation and nausea Other reaction(s): sedation and nausea Delusions (intolerance)  PILLS ONLY sedation, dizziness, nausea,  and itching Other reaction(s): sedation and nausea Other reaction(s): sedation and nausea Delusions (intolerance)  PILLS ONLY sedation, dizziness, nausea,  and itching Other reaction(s): sedation and nausea Other reaction(s): sedation and nausea Delusions (intolerance)  PILLS ONLY sedation, dizziness, nausea,  and itching Other reaction(s): sedation and nausea Other reaction(s): sedation and nausea Delusions (intolerance)  PILLS ONLY sedation, dizziness, nausea,  and itching Other reaction(s): sedation and nausea Other reaction(s): sedation and nausea Delusions (intolerance)  PILLS ONLY sedation, dizziness, nausea,  and itching Other reaction(s): sedation and nausea Other  reaction(s): sedation and nausea   Pentazocine Other (See Comments)    Unknown Other reaction(s): Unknown Other reaction(s): Unknown Other reaction(s): Other (See Comments), Other (See Comments), Unknown Unknown Other reaction(s): Unknown Other reaction(s): Unknown Unknown Other reaction(s): Unknown Other reaction(s): Unknown Unknown Other reaction(s): Unknown Other reaction(s): Unknown Unknown Other reaction(s): Unknown Other reaction(s): Unknown Unknown Other reaction(s): Unknown Other reaction(s): Unknown Unknown Other reaction(s): Unknown Other reaction(s): Unknown   Septra [Bactrim]     Hives    Sulfasalazine Hives   Morphine Anxiety, Other (See Comments) and Nausea Only    sedation and nausea Other reaction(s): sedation and nausea Other reaction(s): sedation and nausea Other reaction(s): Delusions (intolerance), Other (See Comments), Other (See Comments), sedation and nausea sedation and nausea Other reaction(s): sedation and nausea  Other reaction(s): sedation and nausea sedation and nausea Other reaction(s): sedation and nausea Other reaction(s): sedation and nausea PT CANNOT WAKE UP AFTER TAKING MEDICATION AND HAS NIGHTMARES  sedation and nausea Other reaction(s): sedation and nausea Other reaction(s): sedation and nausea PT CANNOT WAKE UP AFTER TAKING MEDICATION AND HAS NIGHTMARES  sedation and nausea Other reaction(s): sedation and nausea Other reaction(s): sedation and nausea sedation and nausea Other reaction(s): sedation and nausea Other reaction(s): sedation and nausea PT CANNOT WAKE UP AFTER TAKING MEDICATION AND HAS NIGHTMARES  sedation and nausea Other reaction(s): sedation and nausea Other reaction(s): sedation and nausea   Sulfa Antibiotics Hives, Nausea Only and Rash    rash rash rash   Sulfamethoxazole-Trimethoprim Rash    Hives  Hives  Hives  Hives  Hives  Hives    Tapentadol Nausea Only    Other reaction(s): nausea and sedation,  Other (See Comments) nausea and sedation nausea and sedation     Outpatient Medications Prior to Visit  Medication Sig Dispense Refill   acetaminophen (TYLENOL) 500 MG tablet Take 500 mg by mouth every 6 (six) hours as needed for moderate pain.      apixaban (ELIQUIS) 5 MG TABS tablet Take 1 tablet (5 mg total) by mouth 2 (two) times daily. 180 tablet 3   b complex vitamins tablet Take 1 tablet by mouth daily.     Calcium Carbonate Antacid (TUMS CHEWY BITES PO) Take 1 tablet by mouth daily as needed (reflux).      Calcium Citrate-Vitamin D (CALCIUM + D PO) Take 1 tablet by mouth daily.     Carboxymethylcellul-Glycerin (LUBRICATING EYE DROPS OP) Place 1 drop into both eyes daily as needed (dry eyes).     cephALEXin (KEFLEX) 500 MG capsule Take 2 grams (4 tablets) by mouth 1 hour prior to dental cleaning/procedure. 4 capsule 2   clonazePAM (KLONOPIN) 0.5 MG tablet Take 1 tablet by mouth twice daily as needed for anxiety 30 tablet 3   denosumab (PROLIA) 60 MG/ML SOSY injection Inject 60 mg into the skin every 6 (six) months.     fluticasone (FLONASE) 50 MCG/ACT nasal spray Place 2 sprays into both nostrils daily. 16 g 6   furosemide (LASIX) 40 MG tablet Take 1 tablet (40 mg total) by mouth daily. 30 tablet 1   gabapentin (NEURONTIN) 100 MG capsule Take 100 mg by mouth 2 (two) times daily.     levothyroxine (SYNTHROID) 75 MCG tablet Take 1 tablet by mouth once daily 90 tablet 0   meclizine (ANTIVERT) 25 MG tablet TAKE 1 TABLET BY MOUTH THREE TIMES DAILY AS NEEDED FOR DIZZINESS 30 tablet 0   metoprolol tartrate (LOPRESSOR) 50 MG tablet Take 1 tablet (50 mg total) by mouth 2 (two) times daily. 180 tablet 3   Nutritional Supplements (,FEEDING SUPPLEMENT, PROSOURCE PLUS) liquid Take 30 mLs by mouth 2 (two) times daily between meals. 887 mL 0   nystatin (MYCOSTATIN/NYSTOP) powder Apply topically 2 (two) times daily as needed (skin irritation (under breasts)). 15 g 0   ondansetron (ZOFRAN) 4 MG tablet  TAKE 1 TABLET BY MOUTH EVERY 8 HOURS AS NEEDED 30 tablet 0   oxyCODONE (ROXICODONE) 5 MG/5ML solution Take 4 mLs (4 mg total) by mouth every 4 (four) hours as needed for moderate pain.  0   polyethylene glycol (MIRALAX / GLYCOLAX) packet Take 17 g by mouth daily as needed for mild constipation.     potassium chloride SA (KLOR-CON M) 20 MEQ tablet Take 1 tablet (20 mEq  total) by mouth daily. 90 tablet 3   promethazine (PHENERGAN) 25 MG tablet Take 1 tablet (25 mg total) by mouth 2 (two) times daily as needed for nausea or vomiting. 20 tablet 0   sodium chloride (OCEAN) 0.65 % SOLN nasal spray Place 1 spray into both nostrils as needed for congestion (nose bleeds).     valACYclovir (VALTREX) 500 MG tablet Take 500 mg by mouth daily as needed (breakouts).      No facility-administered medications prior to visit.   Review of Systems  Constitutional:  Negative for chills, fever, malaise/fatigue and weight loss.  HENT:  Negative for congestion, sinus pain and sore throat.   Eyes: Negative.   Respiratory:  Positive for shortness of breath. Negative for cough, hemoptysis, sputum production and wheezing.   Cardiovascular:  Positive for chest pain. Negative for palpitations, orthopnea, claudication and leg swelling.  Gastrointestinal:  Positive for heartburn. Negative for abdominal pain, nausea and vomiting.  Genitourinary: Negative.   Musculoskeletal:  Negative for joint pain and myalgias.  Skin:  Negative for rash.  Neurological:  Negative for weakness.  Endo/Heme/Allergies: Negative.   Psychiatric/Behavioral:  The patient is nervous/anxious.    Objective:   Vitals:   07/24/22 1136  BP: 126/74  Pulse: (!) 56  SpO2: 97%  Weight: 117 lb (53.1 kg)  Height: '5\' 6"'$  (1.676 m)   Physical Exam Constitutional:      General: She is not in acute distress.    Appearance: She is not ill-appearing.  HENT:     Head: Normocephalic and atraumatic.  Eyes:     General: No scleral  icterus. Cardiovascular:     Rate and Rhythm: Normal rate and regular rhythm.     Pulses: Normal pulses.     Heart sounds: Normal heart sounds. No murmur heard. Pulmonary:     Effort: Pulmonary effort is normal.     Breath sounds: Normal breath sounds. No wheezing, rhonchi or rales.  Musculoskeletal:     Right lower leg: No edema.     Left lower leg: No edema.  Skin:    General: Skin is warm and dry.  Neurological:     General: No focal deficit present.     Mental Status: She is alert.    CBC    Component Value Date/Time   WBC 6.6 10/31/2021 1436   RBC 4.52 10/31/2021 1436   HGB 13.4 10/31/2021 1436   HGB 12.4 11/24/2019 1129   HCT 40.6 10/31/2021 1436   HCT 36.5 11/24/2019 1129   PLT 187.0 10/31/2021 1436   PLT 175 11/24/2019 1129   MCV 89.9 10/31/2021 1436   MCV 90 11/24/2019 1129   MCH 25.9 (L) 02/06/2020 0554   MCHC 33.0 10/31/2021 1436   RDW 14.3 10/31/2021 1436   RDW 12.4 11/24/2019 1129   LYMPHSABS 1.5 10/31/2021 1436   MONOABS 0.5 10/31/2021 1436   EOSABS 0.1 10/31/2021 1436   BASOSABS 0.0 10/31/2021 1436      Latest Ref Rng & Units 06/16/2022    1:50 PM 06/03/2022    1:14 PM 05/26/2022    2:22 PM  BMP  Glucose 70 - 99 mg/dL 96  88  94   BUN 8 - 27 mg/dL '30  21  20   '$ Creatinine 0.57 - 1.00 mg/dL 0.88  0.89  0.91   BUN/Creat Ratio 12 - 28 34  24  22   Sodium 134 - 144 mmol/L 144  144  145   Potassium 3.5 - 5.2 mmol/L  4.5  4.8  4.1   Chloride 96 - 106 mmol/L 98  99  99   CO2 20 - 29 mmol/L 33  33  33   Calcium 8.7 - 10.3 mg/dL 9.3  9.4  9.8    Chest imaging: CT Chest 02/13/22 1. Right middle lobe and left upper lobe tree-in-bud nodularity consistent with atypical infection. 2. Interval development of a 6x4 mm subpleural nodule along the left major fissure. No follow-up needed if patient is low-risk.This recommendation follows the consensus statement: Guidelines for Management of Incidental Pulmonary Nodules Detected on CT Images: From the  Fleischner Society 2017; Radiology 2017; 284:228-243. 3. Cardiomegaly. 4. Mitral valve replacement.  PFT:    Latest Ref Rng & Units 01/01/2021   11:49 AM 12/01/2019    9:39 AM  PFT Results  FVC-Pre L 1.55  2.13   FVC-Predicted Pre % 51  69   FVC-Post L 1.67    FVC-Predicted Post % 55    Pre FEV1/FVC % % 74  74   Post FEV1/FCV % % 81    FEV1-Pre L 1.15  1.57   FEV1-Predicted Pre % 50  68   FEV1-Post L 1.36    DLCO uncorrected ml/min/mmHg 21.48  14.09   DLCO UNC% % 105  68   DLCO corrected ml/min/mmHg 22.12  14.56   DLCO COR %Predicted % 108  71   DLVA Predicted % 110  105   TLC L 4.50  5.36   TLC % Predicted % 84  100   RV % Predicted % 131  140     Labs:  Path:  Echo 02/11/22: LV EF 50-55%. RV systolic function is moderately reduced. RV size is moderately enlarged. Mildly elevated pulmonary artery systolic pressure 51.0CHEN. RA mildly dilated.  Heart Catheterization:  Assessment & Plan:   Bronchiectasis without complication ([place]) - Plan: Pulse oximetry, overnight, Ambulatory Referral for DME, AMB referral to pulmonary rehabilitation  Discussion: Danny Zimny is a 77 year old woman with history of mitral regurgitation s/p minimally invassive MV repair and MAZE procedure complicated by LV free wall rupture 10/2019 and scoliosis with 7 spinal surgeries in 2015 who returns to pulmonary clinic for shortness of breath.   Her bronchiectasis is very mild based on CT imaging. This is not the etiology of her shortness of breath. PFTs are within normal limits with positive bronchodilator response. We will order her a nebulizer machine and start her on yupelri nebulizer treatments and monitor for any improvement in her dyspnea.   We will refer her to pulmonary rehab for likely deconditioning that is contributing to her dyspnea.   She has history of complete spinal stenosis which is likely leading to some degree of restriction in her chest wall mobility that can add to her shortness of  breath.   She had mildly elevated pulmonary artery pressures on prior echo. We will check an overnight oximetry test to rule out nocturnal hypoxemia.   Follow up in 3 months.  Freda Jackson, MD [facility] Office: [phone]    Current Outpatient Medications:    acetaminophen (TYLENOL) 500 MG tablet, Take 500 mg by mouth every 6 (six) hours as needed for moderate pain. , Disp: , Rfl:    apixaban (ELIQUIS) 5 MG TABS tablet, Take 1 tablet (5 mg total) by mouth 2 (two) times daily., Disp: 180 tablet, Rfl: 3   b complex vitamins tablet, Take 1 tablet by mouth daily., Disp: , Rfl:    Calcium Carbonate  Antacid (TUMS CHEWY BITES PO), Take 1 tablet by mouth daily as needed (reflux). , Disp: , Rfl:    Calcium Citrate-Vitamin D (CALCIUM + D PO), Take 1 tablet by mouth daily., Disp: , Rfl:    Carboxymethylcellul-Glycerin (LUBRICATING EYE DROPS OP), Place 1 drop into both eyes daily as needed (dry eyes)., Disp: , Rfl:    cephALEXin (KEFLEX) 500 MG capsule, Take 2 grams (4 tablets) by mouth 1 hour prior to dental cleaning/procedure., Disp: 4 capsule, Rfl: 2   clonazePAM (KLONOPIN) 0.5 MG tablet, Take 1 tablet by mouth twice daily as needed for anxiety, Disp: 30 tablet, Rfl: 3   denosumab (PROLIA) 60 MG/ML SOSY injection, Inject 60 mg into the skin every 6 (six) months., Disp: , Rfl:    fluticasone (FLONASE) 50 MCG/ACT nasal spray, Place 2 sprays into both nostrils daily., Disp: 16 g, Rfl: 6   furosemide (LASIX) 40 MG tablet, Take 1 tablet (40 mg total) by mouth daily., Disp: 30 tablet, Rfl: 1   gabapentin (NEURONTIN) 100 MG capsule, Take 100 mg by mouth 2 (two) times daily., Disp: , Rfl:    levothyroxine (SYNTHROID) 75 MCG tablet, Take 1 tablet by mouth once daily, Disp: 90 tablet, Rfl: 0   meclizine (ANTIVERT) 25 MG tablet, TAKE 1 TABLET BY MOUTH THREE TIMES DAILY AS NEEDED FOR DIZZINESS, Disp: 30 tablet, Rfl: 0   metoprolol tartrate (LOPRESSOR) 50 MG tablet, Take 1 tablet  (50 mg total) by mouth 2 (two) times daily., Disp: 180 tablet, Rfl: 3   Nutritional Supplements (,FEEDING SUPPLEMENT, PROSOURCE PLUS) liquid, Take 30 mLs by mouth 2 (two) times daily between meals., Disp: 887 mL, Rfl: 0   nystatin (MYCOSTATIN/NYSTOP) powder, Apply topically 2 (two) times daily as needed (skin irritation (under breasts))., Disp: 15 g, Rfl: 0   ondansetron (ZOFRAN) 4 MG tablet, TAKE 1 TABLET BY MOUTH EVERY 8 HOURS AS NEEDED, Disp: 30 tablet, Rfl: 0   oxyCODONE (ROXICODONE) 5 MG/5ML solution, Take 4 mLs (4 mg total) by mouth every 4 (four) hours as needed for moderate pain., Disp: , Rfl: 0   polyethylene glycol (MIRALAX / GLYCOLAX) packet, Take 17 g by mouth daily as needed for mild constipation., Disp: , Rfl:    potassium chloride SA (KLOR-CON M) 20 MEQ tablet, Take 1 tablet (20 mEq total) by mouth daily., Disp: 90 tablet, Rfl: 3   promethazine (PHENERGAN) 25 MG tablet, Take 1 tablet (25 mg total) by mouth 2 (two) times daily as needed for nausea or vomiting., Disp: 20 tablet, Rfl: 0   revefenacin (YUPELRI) 175 MCG/3ML nebulizer solution, Take 3 mLs (175 mcg total) by nebulization daily., Disp: 90 mL, Rfl: 0   sodium chloride (OCEAN) 0.65 % SOLN nasal spray, Place 1 spray into both nostrils as needed for congestion (nose bleeds)., Disp: , Rfl:    valACYclovir (VALTREX) 500 MG tablet, Take 500 mg by mouth daily as needed (breakouts). , Disp: , Rfl:   -

## 2022-07-24 NOTE — Patient Instructions (Addendum)
We will order you a nebulizer machine and supplies  Start yupelri nebulizer treatment daily  Monitor for improvement in your shortness of breath  We will refer you to pulmonary rehab program.   Follow up in 3 months

## 2022-07-25 NOTE — Telephone Encounter (Signed)
Spoke with pt who wanted to know if [name] would help with her SOB. RN ensured her it should. Pt stated understanding. Nothing further needed at this time.

## 2022-07-26 ENCOUNTER — Other Ambulatory Visit: Payer: Self-pay | Admitting: Family Medicine

## 2022-07-26 DIAGNOSIS — E785 Hyperlipidemia, unspecified: Secondary | ICD-10-CM

## 2022-07-28 ENCOUNTER — Encounter (HOSPITAL_COMMUNITY): Payer: Self-pay

## 2022-07-28 ENCOUNTER — Ambulatory Visit (INDEPENDENT_AMBULATORY_CARE_PROVIDER_SITE_OTHER): Payer: Medicare Other | Admitting: Family

## 2022-07-28 ENCOUNTER — Encounter (HOSPITAL_BASED_OUTPATIENT_CLINIC_OR_DEPARTMENT_OTHER): Payer: Self-pay | Admitting: Family

## 2022-07-28 VITALS — BP 94/60 | HR 66 | Ht 66.0 in | Wt 116.0 lb

## 2022-07-28 DIAGNOSIS — I480 Paroxysmal atrial fibrillation: Secondary | ICD-10-CM | POA: Diagnosis not present

## 2022-07-28 DIAGNOSIS — Z8679 Personal history of other diseases of the circulatory system: Secondary | ICD-10-CM | POA: Diagnosis not present

## 2022-07-28 DIAGNOSIS — Z9889 Other specified postprocedural states: Secondary | ICD-10-CM

## 2022-07-28 DIAGNOSIS — I5032 Chronic diastolic (congestive) heart failure: Secondary | ICD-10-CM

## 2022-07-28 DIAGNOSIS — R0602 Shortness of breath: Secondary | ICD-10-CM | POA: Diagnosis not present

## 2022-07-28 NOTE — Progress Notes (Signed)
Office Visit    Patient Name: [name] Date of Encounter: 07/28/2022  PCP:  [name], MD   [place]  Cardiologist:  Kirk Ruths, MD  Advanced Practice Provider:  No care team member to display Electrophysiologist:  Will Meredith Leeds, MD      Chief Complaint    [name] is a 77 y.o. female presents today for shortness of breath.    Past Medical History    Past Medical History:  Diagnosis Date   Allergies    Anxiety    Arthritis    "maybe in my back" (03/31/2018)   Benign paroxysmal positional vertigo 98/33/8250   Complication of anesthesia    Fracture of multiple ribs 2015   "don't know from what; dx'd when I in hospital for 1st back OR" (03/31/2018)   GERD (gastroesophageal reflux disease)    Hair loss 04/12/2012   Herpes    History of blood transfusion    "twice; related to back OR" (03/31/2018)   History of kidney stones    Interstitial cystitis 11/06/2011   Melanoma of ankle ([place]) ~ 2003   "right"   Mitral regurgitation    Osteopenia 02/18/2012   Osteoporosis    PAF (paroxysmal atrial fibrillation) ([place]) 2012   Peripheral neuropathy 11/06/2011   PMDD (premenstrual dysphoric disorder)    PONV (postoperative nausea and vomiting)    nausea, vomiting, hives and dizziness    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/2020   Complete bilateral atrial lesion set using cryothermy and bipolar radiofrequency ablation with clipping of LA appendage via right mini-thoracotomy approach   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   Complex valvuloplasty including artificial Gore-tex neochord placement x12 with 20m Sorin Memo 4D ring annuloplasty   Seasonal allergies    Vaginal delivery    ONE NSVD   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   Past Surgical History:  Procedure Laterality Date   ANTERIOR CERVICAL DECOMP/DISCECTOMY FUSION  ~ 2003   BACK SURGERY     BREAST SURGERY     BREAST BIOPSY--RIGHT BENIGN   BUNIONECTOMY Bilateral     CARDIOVERSION N/A 01/26/2020   Procedure: CARDIOVERSION;  Surgeon: OGeralynn Rile MD;  [place]  Service: Cardiovascular;  Laterality: N/A;   COSMETIC SURGERY  2016   "back of my neck; related to earlier fusion"   [place]"   [place]    "removed bone protruding out of my forehead"   HARDWARE REMOVAL  2016   "related to neck OR"   INCONTINENCE SURGERY     IR THORACENTESIS ASP PLEURAL SPACE W/IMG GUIDE  02/16/2020   MINIMALLY INVASIVE MAZE PROCEDURE N/A 01/17/2020   Procedure: MINIMALLY INVASIVE MAZE PROCEDURE;  Surgeon: ORexene Alberts MD;  [place]  Service: Open Heart Surgery;  Laterality: N/A;   MITRAL VALVE REPAIR Right 01/17/2020   Procedure: MINIMALLY INVASIVE MITRAL VALVE REPAIR (MVR) USING MEMO 4D 32MM;  Surgeon: ORexene Alberts MD;  [place]  Service: Open Heart Surgery;  Laterality: Right;   POSTERIOR CERVICAL FUSION/FORAMINOTOMY  ~ 2008; 2015   RIGHT/LEFT HEART CATH AND CORONARY ANGIOGRAPHY N/A 12/02/2019   Procedure: RIGHT/LEFT HEART CATH AND CORONARY ANGIOGRAPHY;  Surgeon: MBurnell Blanks MD;  [facility];  Service: Cardiovascular;  Laterality: N/A;   SHOULDER ARTHROSCOPY W/ ROTATOR CUFF REPAIR Right 2012   SPINAL FUSION  06/2014 -  2018 X ?7   "scoliosis; my entire back"   TEE WITHOUT CARDIOVERSION N/A 11/21/2019   Procedure: TRANSESOPHAGEAL ECHOCARDIOGRAM (TEE);  Surgeon: Josue Hector, MD;  [facility];  Service: Cardiovascular;  Laterality: N/A;   TEE WITHOUT CARDIOVERSION N/A 01/17/2020   Procedure: TRANSESOPHAGEAL ECHOCARDIOGRAM (TEE);  Surgeon: Rexene Alberts, MD;  Location: [place];  Service: Open Heart Surgery;  Laterality: N/A;   TEE WITHOUT CARDIOVERSION N/A 01/26/2020   Procedure: TRANSESOPHAGEAL ECHOCARDIOGRAM (TEE);  Surgeon: Geralynn Rile, MD;  Location: [place];  Service:  Cardiovascular;  Laterality: N/A;   TUBAL LIGATION     VAGINAL HYSTERECTOMY     TVH    Allergies  Allergies  Allergen Reactions   Buprenorphine Nausea Only and Other (See Comments)    sedation and adhesive reaction Other reaction(s): nausea, sedation and adhesive reaction Other reaction(s): nausea, sedation and adhesive reaction, Other (See Comments), Other (See Comments) sedation and adhesive reaction Other reaction(s): nausea, sedation and adhesive reaction sedation and adhesive reaction Other reaction(s): nausea, sedation and adhesive reaction sedation and adhesive reaction Other reaction(s): nausea, sedation and adhesive reaction sedation and adhesive reaction Other reaction(s): nausea, sedation and adhesive reaction sedation and adhesive reaction Other reaction(s): nausea, sedation and adhesive reaction sedation and adhesive reaction Other reaction(s): nausea, sedation and adhesive reaction   Erythromycin Nausea And Vomiting and Other (See Comments)    Dizziness  Other reaction(s): Other (See Comments) Dizziness  unknown unknown Dizziness  BURNING ALL OVER  BURNING ALL OVER  unknown Dizziness  BURNING ALL OVER    Iodinated Contrast Media Hives, Nausea Only and Rash    unknown unknown unknown   Latex Palpitations, Other (See Comments) and Rash     hives and sores Other reaction(s): hives and sores Other reaction(s):  hives and sores, Other (See Comments), Other (See Comments)  hives and sores Other reaction(s): hives and sores hives hives  hives and sores Other reaction(s): hives and sores hives Sores  hives and sores Other reaction(s): hives and sores hives Sores  hives and sores Other reaction(s): hives and sores hives  hives and sores Other reaction(s): hives and sores hives Sores  hives and sores Other reaction(s): hives and sores   Other Other (See Comments)    CAN NOT TAKE , aLPRAZOLAM, OR ELAVIL DUE TO AFIB FOR ANXIETY  IV CONTRAST  /"DYE". Other reaction(s): hives and sores Other reaction(s): Other (See Comments), Other (See Comments) CAN NOT TAKE , aLPRAZOLAM, OR ELAVIL DUE TO AFIB FOR ANXIETY  IV CONTRAST /"DYE". Other reaction(s): hives and sores IV CONTRAST /"DYE". CAN NOT TAKE , aLPRAZOLAM, OR ELAVIL DUE TO AFIB FOR ANXIETY  IV CONTRAST /"DYE". Other reaction(s): hives and sores Patient states that every time she has anesthesia, she wakes up vomiting, has chills, blood pressure drops CAN NOT TAKE , aLPRAZOLAM, OR ELAVIL DUE TO AFIB FOR ANXIETY  IV CONTRAST /"DYE". Other reaction(s): hives and sores IV CONTRAST /"DYE". Patient states that every time she has anesthesia, she wakes up vomiting, has chills, blood pressure drops CAN NOT TAKE , aLPRAZOLAM, OR ELAVIL DUE TO AFIB FOR ANXIETY  IV CONTRAST /"DYE". Other reaction(s): hives and sores IV CONTRAST /"DYE". CAN NOT TAKE , aLPRAZOLAM, OR ELAVIL DUE TO AFIB FOR ANXIETY  IV CONTRAST /"DYE". Other reaction(s): hives and sores Patient states that every time she has anesthesia, she wakes up vomiting, has chills, blood pressure drops CAN NOT TAKE , aLPRAZOLAM, OR ELAVIL DUE TO AFIB FOR ANXIETY  IV CONTRAST /"DYE". Other reaction(s):  hives and sores   Tape Rash, Other (See Comments) and Itching    Heart monitor stickers must be rotated in order to prevent rash Other reaction(s): hives and sores Adhesive on EKG tabs=BURNS SKIN**PAPER TAPE OK** sores Adhesive on EKG tabs. Per Pt, Ok w/Paper Tape Adhesive on EKG tabs=BURNS SKIN**PAPER TAPE OK** sores Other reaction(s): Other (See Comments) Heart monitor stickers must be rotated in order to prevent rash Adhesive on EKG tabs=BURNS SKIN**PAPER TAPE OK** sores   Ciprofloxacin Other (See Comments)    Other reaction(s): Other (See Comments) Burning all over Burning all over Burning all over   Duloxetine Hcl     Severe diarrhea and upset stomach Other reaction(s): Other (See Comments), Unknown Severe  diarrhea and upset stomach Severe diarrhea and upset stomach Severe diarrhea and upset stomach   Erythromycin Base     Other reaction(s): Other (See Comments), Unknown   Hydrocodone Other (See Comments)    Sedation,dizziness, and nausea Other reaction(s): sedation and nausea Other reaction(s): sedation and nausea Other reaction(s): Other (See Comments), Other (See Comments), sedation and nausea Sedation,dizziness, and nausea Other reaction(s): sedation and nausea Other reaction(s): sedation and nausea Sedation,dizziness, and nausea Other reaction(s): sedation and nausea Other reaction(s): sedation and nausea Sedation,dizziness, and nausea Other reaction(s): sedation and nausea Other reaction(s): sedation and nausea Sedation,dizziness, and nausea Other reaction(s): sedation and nausea Other reaction(s): sedation and nausea Sedation,dizziness, and nausea Other reaction(s): sedation and nausea Other reaction(s): sedation and nausea Sedation,dizziness, and nausea Other reaction(s): sedation and nausea Other reaction(s): sedation and nausea   Hydromorphone Other (See Comments)    Sedation,dizziness, and nausea Other reaction(s): sedation and nausea Other reaction(s): sedation and nausea Other reaction(s): Other (See Comments), Other (See Comments), sedation and nausea Sedation,dizziness, and nausea Other reaction(s): sedation and nausea Other reaction(s): sedation and nausea Sedation,dizziness, and nausea Other reaction(s): sedation and nausea Other reaction(s): sedation and nausea Sedation,dizziness, and nausea Other reaction(s): sedation and nausea Other reaction(s): sedation and nausea Sedation,dizziness, and nausea Other reaction(s): sedation and nausea Other reaction(s): sedation and nausea Sedation,dizziness, and nausea Other reaction(s): sedation and nausea Other reaction(s): sedation and nausea Sedation,dizziness, and nausea Other reaction(s): sedation and  nausea Other reaction(s): sedation and nausea   Iodine     unknown Other reaction(s): Other (See Comments) unknown unknown unknown unknown unknown unknown   Levofloxacin Nausea And Vomiting and Nausea Only   Metrizamide Hives   Nsaids     CANNOT TAKE PER CARDIOLOGIST DUE TO AFIB    Nucynta [Tapentadol Hcl]     nausea and sedation   Oxycodone Other (See Comments)    Delusions (intolerance)  PILLS ONLY sedation, dizziness, nausea,  and itching Other reaction(s): sedation and nausea Other reaction(s): sedation and nausea Other reaction(s): Hallucinations, Other Other reaction(s): Other (See Comments), sedation and nausea Delusions (intolerance)  PILLS ONLY sedation, dizziness, nausea,  and itching Other reaction(s): sedation and nausea Other reaction(s): sedation and nausea Delusions (intolerance)  PILLS ONLY sedation, dizziness, nausea,  and itching Other reaction(s): sedation and nausea Other reaction(s): sedation and nausea Delusions (intolerance)  PILLS ONLY sedation, dizziness, nausea,  and itching Other reaction(s): sedation and nausea Other reaction(s): sedation and nausea Delusions (intolerance)  PILLS ONLY sedation, dizziness, nausea,  and itching Other reaction(s): sedation and nausea Other reaction(s): sedation and nausea Delusions (intolerance)  PILLS ONLY sedation, dizziness, nausea,  and itching Other reaction(s): sedation and nausea Other reaction(s): sedation and nausea Delusions (intolerance)  PILLS ONLY sedation, dizziness, nausea,  and itching Other reaction(s): sedation and nausea Other reaction(s): sedation  and nausea   Pentazocine Other (See Comments)    Unknown Other reaction(s): Unknown Other reaction(s): Unknown Other reaction(s): Other (See Comments), Other (See Comments), Unknown Unknown Other reaction(s): Unknown Other reaction(s): Unknown Unknown Other reaction(s): Unknown Other reaction(s): Unknown Unknown Other reaction(s):  Unknown Other reaction(s): Unknown Unknown Other reaction(s): Unknown Other reaction(s): Unknown Unknown Other reaction(s): Unknown Other reaction(s): Unknown Unknown Other reaction(s): Unknown Other reaction(s): Unknown   Septra [Bactrim]     Hives    Sulfasalazine Hives   Morphine Anxiety, Other (See Comments) and Nausea Only    sedation and nausea Other reaction(s): sedation and nausea Other reaction(s): sedation and nausea Other reaction(s): Delusions (intolerance), Other (See Comments), Other (See Comments), sedation and nausea sedation and nausea Other reaction(s): sedation and nausea Other reaction(s): sedation and nausea sedation and nausea Other reaction(s): sedation and nausea Other reaction(s): sedation and nausea PT CANNOT WAKE UP AFTER TAKING MEDICATION AND HAS NIGHTMARES  sedation and nausea Other reaction(s): sedation and nausea Other reaction(s): sedation and nausea PT CANNOT WAKE UP AFTER TAKING MEDICATION AND HAS NIGHTMARES  sedation and nausea Other reaction(s): sedation and nausea Other reaction(s): sedation and nausea sedation and nausea Other reaction(s): sedation and nausea Other reaction(s): sedation and nausea PT CANNOT WAKE UP AFTER TAKING MEDICATION AND HAS NIGHTMARES  sedation and nausea Other reaction(s): sedation and nausea Other reaction(s): sedation and nausea   Sulfa Antibiotics Hives, Nausea Only and Rash    rash rash rash   Sulfamethoxazole-Trimethoprim Rash    Hives  Hives  Hives  Hives  Hives  Hives    Tapentadol Nausea Only    Other reaction(s): nausea and sedation, Other (See Comments) nausea and sedation nausea and sedation    History of Present Illness    [name] is a 77 y.o. female with a hx of spinal stenosis, chronic dyspnea, previous mitral valve repair and sternotomy with repair of LV free wall laceration 01/2020, PAF s/p MAZE, nonobstructive coronary disease last seen 06/11/22 by Dr. Stanford Breed..  Preoperative  cardiac catheterization May 2021 20% RCA, 20% distal LAD stenosis.   Echo 02/2022 normal LVEF, flattened septum in systole c/w RV pressure overload, moderate RV dysfunction, mild RV enlargement, mild RA enlargement, s/p MV repair with no mitral stenosis and trace MR, mild AI.   She saw Dr. Stanford Breed 06/11/22 to establish at [facility] location. Previous patient of Dr. Johney Frame. She was euvolemic on exam and Lasix continued.   She went to [facility] ED 06/21/22 for worsening dyspnea over 2 weeks and generalized weakness. CXR mild bilateral basilar densities likely secondary to subsegmental atelectasis and less likely developing infiltrates. CT with acute pulmonary infiltrate, multiple small bilatearl reticular nodular densities likely chronic in age. NT ProBNP 1014 (normal <1799)  She saw Dr. Erin Fulling of pulmonology 07/24/22. Did not tolerate Stiolto. Bronchiectasis mild on Ct imaging - PFT were normal. She was started on Yupelri nebulizer treatments. She was recommended for overnight pulse oximetry, referred to pulmonary rehab.   She presents today for follow up independently. She gives a detailed history of her dyspnea dating back to MV repair 01/2020. Notes it worsened early December which prompted her to seek ED evaluation. It has been stable since that time. No edema, orthopnea, PND. Her Lasix has been increased from '20mg'$  to '40mg'$  with no noted improvement. She was surprised Dr. Erin Fulling thought previous spinal stenosis could impact chest wall mobility and breathing and discussed how this impacts breathing.   She is most concerned about the etiology of her dyspnea. Discussed  it is multifactorial. She is focused on improving quality of life and has palliative care services at home. Understandably anxious as she lives alone.   Discussed optimization of heart failure therapy. She is worried about MRA or SGLT2i causing urinary tract infection. Notes when she was a teenager she had a lot of trouble with kidney  stones as well as UTI.   EKGs/Labs/Other Studies Reviewed:   The following studies were reviewed today:  Echo 02/11/22    1. Left ventricular ejection fraction, by estimation, is 50 to 55%. The  left ventricle has low normal function. Left ventricular endocardial  border not optimally defined to evaluate regional wall motion. Left  ventricular diastolic function could not be  evaluated. There is the interventricular septum is flattened in systole,  consistent with right ventricular pressure overload.   2. Right ventricular systolic function is moderately reduced. The right  ventricular size is mildly enlarged. There is mildly elevated pulmonary  artery systolic pressure. The estimated right ventricular systolic  pressure is 54.0 mmHg.   3. Right atrial size was mildly dilated.   4. The mitral valve has been repaired/replaced. Trivial mitral valve  regurgitation. No evidence of mitral stenosis. The mean mitral valve  gradient is 1.9 mmHg with average heart rate of 59 bpm. There is a 32 mm  Sorin Memo 4D prosthetic annuloplasty ring   present in the mitral position. Procedure Date: 01/17/20. Echo findings  are consistent with normal structure and function of the mitral valve  prosthesis.   5. The aortic valve is tricuspid. There is mild calcification of the  aortic valve. There is mild thickening of the aortic valve. Aortic valve  regurgitation is mild. Aortic valve sclerosis is present, with no evidence  of aortic valve stenosis.   6. The inferior vena cava is dilated in size with <50% respiratory  variability, suggesting right atrial pressure of 15 mmHg.  EKG:  EKG is ordered today. EKG performed today deminstrates junctional rhythm 66 bpm with no acute ST/T wave changes.   Recent Labs: 10/31/2021: ALT 22; Hemoglobin 13.4; Platelets 187.0 05/02/2022: TSH 0.46 06/16/2022: BUN 30; Creatinine, Ser 0.88; NT-Pro BNP 1,121; Potassium 4.5; Sodium 144  Recent Lipid Panel    Component  Value Date/Time   CHOL 175 12/10/2020 1338   TRIG 79.0 12/10/2020 1338   HDL 80.40 12/10/2020 1338   CHOLHDL 2 12/10/2020 1338   VLDL 15.8 12/10/2020 1338   LDLCALC 79 12/10/2020 1338   LDLDIRECT 136.5 06/08/2013 1049    Risk Assessment/Calculations:   CHA2DS2-VASc Score = 6   This indicates a 9.7% annual risk of stroke. The patient's score is based upon: CHF History: 1 HTN History: 1 Diabetes History: 0 Stroke History: 0 Vascular Disease History: 1 Age Score: 2 Gender Score: 1     Home Medications   Current Meds  Medication Sig   acetaminophen (TYLENOL) 500 MG tablet Take 500 mg by mouth every 6 (six) hours as needed for moderate pain.    apixaban (ELIQUIS) 5 MG TABS tablet Take 1 tablet (5 mg total) by mouth 2 (two) times daily.   b complex vitamins tablet Take 1 tablet by mouth daily.   Calcium Carbonate Antacid (TUMS CHEWY BITES PO) Take 1 tablet by mouth daily as needed (reflux).    Calcium Citrate-Vitamin D (CALCIUM + D PO) Take 1 tablet by mouth daily.   Carboxymethylcellul-Glycerin (LUBRICATING EYE DROPS OP) Place 1 drop into both eyes daily as needed (dry eyes).   cephALEXin (KEFLEX) 500  MG capsule Take 2 grams (4 tablets) by mouth 1 hour prior to dental cleaning/procedure.   clonazePAM (KLONOPIN) 0.5 MG tablet Take 1 tablet by mouth twice daily as needed for anxiety   denosumab (PROLIA) 60 MG/ML SOSY injection Inject 60 mg into the skin every 6 (six) months.   fluticasone (FLONASE) 50 MCG/ACT nasal spray Place 2 sprays into both nostrils daily.   furosemide (LASIX) 40 MG tablet Take 1 tablet (40 mg total) by mouth daily.   gabapentin (NEURONTIN) 100 MG capsule Take 100 mg by mouth 2 (two) times daily.   levothyroxine (SYNTHROID) 75 MCG tablet Take 1 tablet by mouth once daily   meclizine (ANTIVERT) 25 MG tablet TAKE 1 TABLET BY MOUTH THREE TIMES DAILY AS NEEDED FOR DIZZINESS   metoprolol tartrate (LOPRESSOR) 50 MG tablet Take 1 tablet (50 mg total) by mouth 2  (two) times daily.   Nutritional Supplements (,FEEDING SUPPLEMENT, PROSOURCE PLUS) liquid Take 30 mLs by mouth 2 (two) times daily between meals.   nystatin (MYCOSTATIN/NYSTOP) powder Apply topically 2 (two) times daily as needed (skin irritation (under breasts)).   ondansetron (ZOFRAN) 4 MG tablet TAKE 1 TABLET BY MOUTH EVERY 8 HOURS AS NEEDED   oxyCODONE (ROXICODONE) 5 MG/5ML solution Take 4 mLs (4 mg total) by mouth every 4 (four) hours as needed for moderate pain.   polyethylene glycol (MIRALAX / GLYCOLAX) packet Take 17 g by mouth daily as needed for mild constipation.   potassium chloride SA (KLOR-CON M) 20 MEQ tablet Take 1 tablet (20 mEq total) by mouth daily.   promethazine (PHENERGAN) 25 MG tablet Take 1 tablet (25 mg total) by mouth 2 (two) times daily as needed for nausea or vomiting.   sodium chloride (OCEAN) 0.65 % SOLN nasal spray Place 1 spray into both nostrils as needed for congestion (nose bleeds).   valACYclovir (VALTREX) 500 MG tablet Take 500 mg by mouth daily as needed (breakouts).      Review of Systems      All other systems reviewed and are otherwise negative except as noted above.  Physical Exam    VS:  BP 94/60   Pulse 66   Ht '5\' 6"'$  (1.676 m)   Wt 116 lb (52.6 kg)   BMI 18.72 kg/m  , BMI Body mass index is 18.72 kg/m.  Wt Readings from Last 3 Encounters:  07/28/22 116 lb (52.6 kg)  07/24/22 117 lb (53.1 kg)  07/14/22 117 lb 4 oz (53.2 kg)     GEN: Well nourished, well developed, in no acute distress. HEENT: normal. Neck: Supple, no JVD, carotid bruits, or masses. Cardiac: RRR, no murmurs, rubs, or gallops. No clubbing, cyanosis, edema.  Radials/PT 2+ and equal bilaterally.  Respiratory:  Respirations regular and unlabored, clear to auscultation bilaterally. GI: Soft, nontender, nondistended. MS: No deformity or atrophy. Skin: Warm and dry, no rash. Neuro:  Strength and sensation are intact. Psych: Normal affect. Anxious.   Assessment & Plan     S/p MV repair for severe MR - Echo 02/2022 stable valve function.   Diastolic heart failure - BNP 09/2020 1526 ? 09/2021 1050 ? 05/14/22 1296 ?  06/16/22 1121. Appears to be a more chronic elevation. Lowest reading in 5 years 10/30/20 768. Her weight has been stable, no edema, euvolemic on exam.  No improvement in dyspnea since Lasix increased from '20mg'$  to '40mg'$ . She is hesitant about SGT2i due to history of UTI. We discussed addition of low dose Spironolactone 12.'5mg'$  QD. She is hesitant  about multiple medication changes and wishes to trial nebulizer by pulmonology and reconsider Spironolactone at follow up with Dr. Stanford Breed.  Low sodium diet, fluid restriction <2L, and daily weights encouraged. Educated to contact our office for weight gain of 2 lbs overnight or 5 lbs in one week.   DOE - Multifactorial diastolic heart failure, deconditioning. Management as above.   Bronchiectasis - Mild by CT imaging. Follows with Dr. Erin Fulling. Pending overnight oximetry test. She was recently prescribed yupelri nebulizer treatments and is awaiting delivery. Hopeful this will improve her dyspnea.   Deconditioning - contributory to dyspnea. Dr. Erin Fulling has referred to pulmonary rehab.   PAF s/p MAZE - Continue Metoprolol tartrate '50mg'$  BID. Junctional rhythm today. CHA2DS2-VASc Score = 6 [CHF History: 1, HTN History: 1, Diabetes History: 0, Stroke History: 0, Vascular Disease History: 1, Age Score: 2, Gender Score: 1].  Therefore, the patient's annual risk of stroke is 9.7 %.    Denies bleeding complications. Continue Eliquis '5mg'$  BID. Does not meet dose reduction criteria.          Cardiac Rehabilitation Eligibility Assessment     Disposition: Follow up in 2 month(s) with Kirk Ruths, MD or APP.  Signed, Loel Dubonnet, NP 07/28/2022, 6:53 PM [facility]

## 2022-07-28 NOTE — Telephone Encounter (Signed)
See other phone note from 07/22/22. Patient has an appointment 07/28/22.

## 2022-07-28 NOTE — Patient Instructions (Addendum)
Medication Instructions:  Continue your current medications.   At your follow up we may consider adding a low dose of a fluid pill called Spironolactone at dose of 12.'5mg'$  once per day  *If you need a refill on your cardiac medications before your next appointment, please call your pharmacy*   Lab Work/Testing/Procedures: None ordered today.   Follow-Up: At [facility], you and your health needs are our priority.  As part of our continuing mission to provide you with exceptional heart care, we have created designated Provider Care Teams.  These Care Teams include your primary Cardiologist (physician) and Advanced Practice Providers (APPs -  Physician Assistants and Nurse Practitioners) who all work together to provide you with the care you need, when you need it.  We recommend signing up for the patient portal called "MyChart".  Sign up information is provided on this After Visit Summary.  MyChart is used to connect with patients for Virtual Visits (Telemedicine).  Patients are able to view lab/test results, encounter notes, upcoming appointments, etc.  Non-urgent messages can be sent to your provider as well.   To learn more about what you can do with MyChart, go to [email or web address].    Your next appointment:   In March with Dr. Stanford Breed  Other Instructions   Your echocardiogram in August showed your heart pumping function is normal at 50-55% (consistent with prior). 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 This can cause you to hold onto fluid. Your Furosemide (Lasix) helps to reduce the amount of fluid.  Your shortness of breath is likely from multiple things: extra fluid from the right side of your heart not pumping well, deconditioning, lungs being restricted due to your spine surgeries  Spironolactone Tablets What is this medication? SPIRONOLACTONE (speer on oh LAK tone) treats high blood pressure and  heart failure. It may also be used to reduce swelling related to heart, kidney, or liver disease. It helps your kidneys remove more fluid and salt from your blood through the urine without losing too much potassium. It belongs to a group of medications called diuretics. This medicine may be used for other purposes; ask your health care provider or pharmacist if you have questions. COMMON BRAND NAME(S): Aldactone  How should I use this medication? Take this medication by mouth. Take it as directed on the prescription label at the same time every day. You can take it with or without food. You should always take it the same way. Keep taking it unless your care team tells you to stop. Talk to your care team about the use of this medication in children. Special care may be needed. Overdosage: If you think you have taken too much of this medicine contact a poison control center or emergency room at once. NOTE: This medicine is only for you. Do not share this medicine with others.

## 2022-07-29 ENCOUNTER — Ambulatory Visit (INDEPENDENT_AMBULATORY_CARE_PROVIDER_SITE_OTHER): Payer: Medicare Other

## 2022-07-29 ENCOUNTER — Other Ambulatory Visit: Payer: Self-pay

## 2022-07-29 ENCOUNTER — Other Ambulatory Visit: Payer: Self-pay | Admitting: Obstetrics & Gynecology

## 2022-07-29 DIAGNOSIS — Z780 Asymptomatic menopausal state: Secondary | ICD-10-CM

## 2022-07-29 DIAGNOSIS — M810 Age-related osteoporosis without current pathological fracture: Secondary | ICD-10-CM

## 2022-07-29 DIAGNOSIS — M818 Other osteoporosis without current pathological fracture: Secondary | ICD-10-CM

## 2022-07-29 DIAGNOSIS — Z13820 Encounter for screening for osteoporosis: Secondary | ICD-10-CM

## 2022-07-29 MED ORDER — METOPROLOL TARTRATE 50 MG PO TABS: 50.0000 mg | ORAL_TABLET | Freq: Two times a day (BID) | ORAL | 3 refills | Status: DC

## 2022-07-29 NOTE — Telephone Encounter (Signed)
Rx(s) sent to pharmacy electronically.  

## 2022-07-31 ENCOUNTER — Telehealth: Payer: Self-pay | Admitting: Family Medicine

## 2022-07-31 NOTE — Telephone Encounter (Signed)
Called and LM authorizing extension

## 2022-07-31 NOTE — Telephone Encounter (Signed)
Caller name: [facility]   On DPR?: Yes  Call back number: [phone]  Provider they see: [name], MD  Reason for call: Verbal Orders  SN 1wk2. Lorriane Shire states Pt had too many Drs visits last week and want extension.

## 2022-08-01 ENCOUNTER — Telehealth: Payer: Self-pay | Admitting: Cardiology

## 2022-08-01 ENCOUNTER — Telehealth: Payer: Self-pay

## 2022-08-01 DIAGNOSIS — H269 Unspecified cataract: Secondary | ICD-10-CM

## 2022-08-01 DIAGNOSIS — J302 Other seasonal allergic rhinitis: Secondary | ICD-10-CM

## 2022-08-01 DIAGNOSIS — G243 Spasmodic torticollis: Secondary | ICD-10-CM

## 2022-08-01 DIAGNOSIS — I878 Other specified disorders of veins: Secondary | ICD-10-CM

## 2022-08-01 DIAGNOSIS — I480 Paroxysmal atrial fibrillation: Secondary | ICD-10-CM | POA: Diagnosis not present

## 2022-08-01 DIAGNOSIS — Z48812 Encounter for surgical aftercare following surgery on the circulatory system: Secondary | ICD-10-CM | POA: Diagnosis not present

## 2022-08-01 DIAGNOSIS — I5032 Chronic diastolic (congestive) heart failure: Secondary | ICD-10-CM

## 2022-08-01 DIAGNOSIS — G609 Hereditary and idiopathic neuropathy, unspecified: Secondary | ICD-10-CM

## 2022-08-01 DIAGNOSIS — M8580 Other specified disorders of bone density and structure, unspecified site: Secondary | ICD-10-CM

## 2022-08-01 DIAGNOSIS — I2510 Atherosclerotic heart disease of native coronary artery without angina pectoris: Secondary | ICD-10-CM

## 2022-08-01 DIAGNOSIS — Z87442 Personal history of urinary calculi: Secondary | ICD-10-CM

## 2022-08-01 DIAGNOSIS — R1310 Dysphagia, unspecified: Secondary | ICD-10-CM

## 2022-08-01 DIAGNOSIS — Z87891 Personal history of nicotine dependence: Secondary | ICD-10-CM

## 2022-08-01 DIAGNOSIS — H9190 Unspecified hearing loss, unspecified ear: Secondary | ICD-10-CM

## 2022-08-01 DIAGNOSIS — M1990 Unspecified osteoarthritis, unspecified site: Secondary | ICD-10-CM | POA: Diagnosis not present

## 2022-08-01 DIAGNOSIS — J449 Chronic obstructive pulmonary disease, unspecified: Secondary | ICD-10-CM

## 2022-08-01 DIAGNOSIS — G629 Polyneuropathy, unspecified: Secondary | ICD-10-CM

## 2022-08-01 DIAGNOSIS — I110 Hypertensive heart disease with heart failure: Secondary | ICD-10-CM

## 2022-08-01 DIAGNOSIS — F419 Anxiety disorder, unspecified: Secondary | ICD-10-CM | POA: Diagnosis not present

## 2022-08-01 DIAGNOSIS — K219 Gastro-esophageal reflux disease without esophagitis: Secondary | ICD-10-CM

## 2022-08-01 DIAGNOSIS — F3281 Premenstrual dysphoric disorder: Secondary | ICD-10-CM

## 2022-08-01 DIAGNOSIS — M810 Age-related osteoporosis without current pathological fracture: Secondary | ICD-10-CM

## 2022-08-01 DIAGNOSIS — M549 Dorsalgia, unspecified: Secondary | ICD-10-CM

## 2022-08-01 DIAGNOSIS — F339 Major depressive disorder, recurrent, unspecified: Secondary | ICD-10-CM

## 2022-08-01 DIAGNOSIS — G8929 Other chronic pain: Secondary | ICD-10-CM

## 2022-08-01 MED ORDER — APIXABAN 5 MG PO TABS: 5.0000 mg | ORAL_TABLET | Freq: Two times a day (BID) | ORAL | 5 refills | Status: DC

## 2022-08-01 NOTE — Telephone Encounter (Signed)
Prescription refill request for Eliquis received. Indication: Afib  Last office visit: 07/28/22 Gilford Rile)  Scr: 0.75 (06/21/22)  Age: 77 Weight: 52.6kg  Appropriate dose. Refill sent.  Called pt and made her aware. Pt verbalized understanding. No other questions at this time.

## 2022-08-01 NOTE — Telephone Encounter (Signed)
[facility] sent forms to be signed by [name]. Placed in [name] to be signed folder

## 2022-08-01 NOTE — Telephone Encounter (Signed)
Form faxed and placed in scan  

## 2022-08-01 NOTE — Telephone Encounter (Signed)
Pt c/o medication issue:  1. Name of Medication:   apixaban (ELIQUIS) 5 MG TABS tablet  2. How are you currently taking this medication (dosage and times per day)? As prescribed  3. Are you having a reaction (difficulty breathing--STAT)?   4. What is your medication issue?    Patient stated she was on a program through the manufacturer to get this medication which has now expired.  Patient stated she will need a prescription sent to [place], [place] to start back as she has to meet her deductible.  Patient would like a call back to discuss this.

## 2022-08-01 NOTE — Telephone Encounter (Signed)
Form completed and returned to [place] 

## 2022-08-01 NOTE — Telephone Encounter (Signed)
Routed to [place] pool

## 2022-08-04 DIAGNOSIS — M1990 Unspecified osteoarthritis, unspecified site: Secondary | ICD-10-CM | POA: Diagnosis not present

## 2022-08-04 DIAGNOSIS — I480 Paroxysmal atrial fibrillation: Secondary | ICD-10-CM | POA: Diagnosis not present

## 2022-08-04 DIAGNOSIS — Z48812 Encounter for surgical aftercare following surgery on the circulatory system: Secondary | ICD-10-CM | POA: Diagnosis not present

## 2022-08-04 DIAGNOSIS — F419 Anxiety disorder, unspecified: Secondary | ICD-10-CM | POA: Diagnosis not present

## 2022-08-04 DIAGNOSIS — M8580 Other specified disorders of bone density and structure, unspecified site: Secondary | ICD-10-CM | POA: Diagnosis not present

## 2022-08-04 DIAGNOSIS — K219 Gastro-esophageal reflux disease without esophagitis: Secondary | ICD-10-CM | POA: Diagnosis not present

## 2022-08-05 ENCOUNTER — Telehealth: Payer: Self-pay | Admitting: Family Medicine

## 2022-08-05 DIAGNOSIS — M8580 Other specified disorders of bone density and structure, unspecified site: Secondary | ICD-10-CM | POA: Diagnosis not present

## 2022-08-05 DIAGNOSIS — F419 Anxiety disorder, unspecified: Secondary | ICD-10-CM | POA: Diagnosis not present

## 2022-08-05 DIAGNOSIS — K219 Gastro-esophageal reflux disease without esophagitis: Secondary | ICD-10-CM | POA: Diagnosis not present

## 2022-08-05 DIAGNOSIS — M1990 Unspecified osteoarthritis, unspecified site: Secondary | ICD-10-CM | POA: Diagnosis not present

## 2022-08-05 DIAGNOSIS — Z48812 Encounter for surgical aftercare following surgery on the circulatory system: Secondary | ICD-10-CM | POA: Diagnosis not present

## 2022-08-05 DIAGNOSIS — I480 Paroxysmal atrial fibrillation: Secondary | ICD-10-CM | POA: Diagnosis not present

## 2022-08-05 NOTE — Telephone Encounter (Signed)
Caller name: [place]   On DPR?:   Call back number: [phone]  Provider they see: [name], MD  Reason for call: Pulse Rate is below their parameter which is 60 today it was 55 non symptomatic.Normal for pt.

## 2022-08-05 NOTE — Telephone Encounter (Signed)
FYI

## 2022-08-05 NOTE — Telephone Encounter (Signed)
Aware.  No concerns at this time

## 2022-08-05 NOTE — Telephone Encounter (Signed)
Update to HR <55

## 2022-08-05 NOTE — Telephone Encounter (Signed)
Ria Comment is asking if you would like to update this parameter or if you would like to be notified every time she is below 60 BPM?

## 2022-08-05 NOTE — Telephone Encounter (Signed)
Updated with lindsay

## 2022-08-06 ENCOUNTER — Telehealth (HOSPITAL_COMMUNITY): Payer: Self-pay

## 2022-08-06 NOTE — Telephone Encounter (Signed)
Pt dont feel she is capable of doing the pulmonary rehab program at this time. Closed referral.

## 2022-08-07 ENCOUNTER — Telehealth (HOSPITAL_COMMUNITY): Payer: Self-pay

## 2022-08-07 NOTE — Telephone Encounter (Signed)
Pt called back and changed her mind about the pulmonary rehab program and wants to participate. Pt will come in for orientation on 08/20/22'@10'$ :30am and will attend the 1:15pm exercise class time.

## 2022-08-07 NOTE — Telephone Encounter (Signed)
Pt insurance is active and benefits verified through [employer]b Co-pay 0, DED $240/$240 met, out of pocket 0/0 met, co-insurance 20%. no pre-authorization required. Passport, 08/07/2022'@11'$ :Linward Headland, REF# [id number]  2ndary insurance is active and benefits verified through [place]. Co-pay 0, DED 0/0 met, out of pocket 0/0 met, co-insurance 0%. No pre-authorization required.

## 2022-08-08 ENCOUNTER — Encounter: Payer: Self-pay | Admitting: Cardiology

## 2022-08-08 ENCOUNTER — Telehealth: Payer: Self-pay | Admitting: Cardiology

## 2022-08-08 DIAGNOSIS — Z79899 Other long term (current) drug therapy: Secondary | ICD-10-CM

## 2022-08-08 DIAGNOSIS — R0602 Shortness of breath: Secondary | ICD-10-CM

## 2022-08-08 DIAGNOSIS — I5032 Chronic diastolic (congestive) heart failure: Secondary | ICD-10-CM

## 2022-08-08 DIAGNOSIS — R7989 Other specified abnormal findings of blood chemistry: Secondary | ICD-10-CM

## 2022-08-08 DIAGNOSIS — Z9889 Other specified postprocedural states: Secondary | ICD-10-CM

## 2022-08-08 MED ORDER — FUROSEMIDE 40 MG PO TABS: 40.0000 mg | ORAL_TABLET | Freq: Every day | ORAL | 1 refills | Status: DC

## 2022-08-08 NOTE — Telephone Encounter (Signed)
error 

## 2022-08-08 NOTE — Telephone Encounter (Signed)
Patient stated her current dosage of lasix on the prescription bottle states take M-W-F.  Looked on the last encounter, Per Laurann Montana, NP: No improvement in dyspnea since Lasix increased from '20mg'$  to '40mg'$ . Called pharmacy on file and sent over an refill of the correct medication and dosage. Called the patient and explained the correct dosage. She does have some concerns if this medication does cause kidney damage. Will forward to MD and nurse. Patient voiced understanding.

## 2022-08-08 NOTE — Telephone Encounter (Signed)
Pt c/o medication issue:  1. Name of Medication:   furosemide (LASIX) 40 MG tablet    2. How are you currently taking this medication (dosage and times per day)?   3. Are you having a reaction (difficulty breathing--STAT)?   4. What is your medication issue? Pt states she got refill on this medication and the instructions have changed and she was unaware. Please advise on how pt should be taking

## 2022-08-11 ENCOUNTER — Other Ambulatory Visit: Payer: Self-pay

## 2022-08-11 ENCOUNTER — Telehealth: Payer: Self-pay | Admitting: Cardiology

## 2022-08-11 DIAGNOSIS — Z90710 Acquired absence of both cervix and uterus: Secondary | ICD-10-CM | POA: Diagnosis not present

## 2022-08-11 DIAGNOSIS — K219 Gastro-esophageal reflux disease without esophagitis: Secondary | ICD-10-CM | POA: Diagnosis not present

## 2022-08-11 DIAGNOSIS — Z7901 Long term (current) use of anticoagulants: Secondary | ICD-10-CM | POA: Diagnosis not present

## 2022-08-11 DIAGNOSIS — Z79899 Other long term (current) drug therapy: Secondary | ICD-10-CM | POA: Diagnosis not present

## 2022-08-11 DIAGNOSIS — Z91040 Latex allergy status: Secondary | ICD-10-CM | POA: Diagnosis not present

## 2022-08-11 DIAGNOSIS — Z87891 Personal history of nicotine dependence: Secondary | ICD-10-CM | POA: Diagnosis not present

## 2022-08-11 DIAGNOSIS — Z886 Allergy status to analgesic agent status: Secondary | ICD-10-CM | POA: Diagnosis not present

## 2022-08-11 DIAGNOSIS — Z7951 Long term (current) use of inhaled steroids: Secondary | ICD-10-CM | POA: Diagnosis not present

## 2022-08-11 DIAGNOSIS — R040 Epistaxis: Secondary | ICD-10-CM | POA: Diagnosis not present

## 2022-08-11 DIAGNOSIS — Z91041 Radiographic dye allergy status: Secondary | ICD-10-CM | POA: Diagnosis not present

## 2022-08-11 DIAGNOSIS — Z79890 Hormone replacement therapy (postmenopausal): Secondary | ICD-10-CM | POA: Diagnosis not present

## 2022-08-11 DIAGNOSIS — Z888 Allergy status to other drugs, medicaments and biological substances status: Secondary | ICD-10-CM | POA: Diagnosis not present

## 2022-08-11 DIAGNOSIS — R58 Hemorrhage, not elsewhere classified: Secondary | ICD-10-CM | POA: Diagnosis not present

## 2022-08-11 DIAGNOSIS — L231 Allergic contact dermatitis due to adhesives: Secondary | ICD-10-CM | POA: Diagnosis not present

## 2022-08-11 DIAGNOSIS — Z882 Allergy status to sulfonamides status: Secondary | ICD-10-CM | POA: Diagnosis not present

## 2022-08-11 DIAGNOSIS — I10 Essential (primary) hypertension: Secondary | ICD-10-CM | POA: Diagnosis not present

## 2022-08-11 DIAGNOSIS — J479 Bronchiectasis, uncomplicated: Secondary | ICD-10-CM | POA: Diagnosis not present

## 2022-08-11 DIAGNOSIS — I4891 Unspecified atrial fibrillation: Secondary | ICD-10-CM | POA: Diagnosis not present

## 2022-08-11 DIAGNOSIS — R030 Elevated blood-pressure reading, without diagnosis of hypertension: Secondary | ICD-10-CM | POA: Diagnosis not present

## 2022-08-11 NOTE — Telephone Encounter (Signed)
Mailbox is full; unable to laeve message.

## 2022-08-11 NOTE — Telephone Encounter (Signed)
Patient states she was in the ER today at [facility] - it was packed.  She states the [facility] ER doctor wanted her take  Cephafalexin  - patient States it is just incase of an infection .  She wanted to know Dr Stanford Breed opinion  if she takes the medication " I really do not want to take anything else if it is not necessary"    Aware will defer to Dr Stanford Breed

## 2022-08-11 NOTE — Telephone Encounter (Signed)
Returning pt call. Unable to leave message.

## 2022-08-11 NOTE — Telephone Encounter (Signed)
Spoke with pt, Aware of dr crenshaw's recommendations.  °

## 2022-08-11 NOTE — Telephone Encounter (Signed)
Pt c/o medication issue:  1. Name of Medication:  Cefalexin  2. How are you currently taking this medication (dosage and times per day)?   3. Are you having a reaction (difficulty breathing--STAT)?   4. What is your medication issue?   Patient states this medication was prescribed by a doctor in the ED and she would like to confirm whether on not Dr. Stanford Breed is agreeable.

## 2022-08-11 NOTE — Telephone Encounter (Signed)
Pt returned call and was informed to continue lasix '40mg'$  daily and come in for blood work. She then stated she had to go to the ED in [place] [facility] [place] in [place] for nosebleed. She has device in rt nostril and still leaking blood. She did not take eliquis today. While on phone BP 118/72, P 104. Recommended patient to stay hydrated. She said her feet are swollen and is glad to take lasix today. She is not able to get lab work today. Please advise on eliquis.

## 2022-08-11 NOTE — Telephone Encounter (Deleted)
LMTCB

## 2022-08-11 NOTE — Telephone Encounter (Signed)
Patient returning call.

## 2022-08-11 NOTE — Telephone Encounter (Signed)
Spoke with pt, aware okay to take antibiotic.

## 2022-08-12 ENCOUNTER — Encounter: Payer: Self-pay | Admitting: Family Medicine

## 2022-08-12 ENCOUNTER — Telehealth: Payer: Self-pay

## 2022-08-12 NOTE — Telephone Encounter (Signed)
[facility] sent forms to be signed . Charge sheet attached as well . Placed in [name] to be signed folder

## 2022-08-14 ENCOUNTER — Telehealth: Payer: Self-pay | Admitting: Cardiology

## 2022-08-14 ENCOUNTER — Ambulatory Visit: Payer: PRIVATE HEALTH INSURANCE | Admitting: Student

## 2022-08-14 ENCOUNTER — Encounter (HOSPITAL_COMMUNITY): Payer: Self-pay | Admitting: *Deleted

## 2022-08-14 ENCOUNTER — Telehealth: Payer: Self-pay | Admitting: Family Medicine

## 2022-08-14 DIAGNOSIS — R040 Epistaxis: Secondary | ICD-10-CM | POA: Diagnosis not present

## 2022-08-14 NOTE — Telephone Encounter (Signed)
Pt went to the ENT and they took out the nasal balloon. No bleeding at this time.Told to start back on Eliquis on Saturday morning.  Pt was wondering if there is a way to cut back on Eliquis- a lesser dose?  And she would love to decrease her Metoprolol, if able. She says she can always discuss this at a later visit.

## 2022-08-14 NOTE — Telephone Encounter (Signed)
Pt c/o swelling: STAT is pt has developed SOB within 24 hours  If swelling, where is the swelling located? ankles  How much weight have you gained and in what time span? Has not gained weight  Have you gained 3 pounds in a day or 5 pounds in a week? no  Do you have a log of your daily weights (if so, list)? no  Are you currently taking a fluid pill? Yes, 40 mg every day  Are you currently SOB? no  Have you traveled recently? no   Patient calling to speak with Hilda Blades about the swelling in her ankles. She says they are still puffed up. She also says she had a bad nose bleed and has to wear a "balloon thing" in her nose.

## 2022-08-14 NOTE — Telephone Encounter (Signed)
[name] wants to move SN discharge date to week. Pt wasn't feeling good.

## 2022-08-14 NOTE — Telephone Encounter (Signed)
Patient is following up, requesting to provide an update after having seen the ENT doctor today.

## 2022-08-14 NOTE — Telephone Encounter (Signed)
Called pt; reports that she has puffiness on inside of ankles every morning when she wakes up. She is still taking her Lasix '40mg'$  daily. No sob and no weight gain. She is still able to walk fine. Recommended her to elevate feet throughout the day. And to be sure and let us now if it gets worse and/or if you have sob and/or weight gain.  Pt reports that she is just very stressed out about her nosebleed. She will be going in today around 1 to [place] to get the nasal balloon taken out. She said she will be seeing Dr Jacalyn Lefevre son.  She is just very worried/concerned about what is going to happen when they take out the balloon.   She has been off of blood thinner since Sunday 08/10/22 at 8pm, because the nosebleed started 08/11/22 and was told not to take it due to the bleeding.   They did not draw blood when she went to the ER. She is wondering if she needs blood drawn and dosage adjusted of blood thinners.  She wanted to know when she needs to start taking the blood thinner again?  I told the patient to call us whenever she get back from her appointment to tell us what the provider said and give an update on any bleeding.  Told her that I would forward this info to Dr Stanford Breed as well.

## 2022-08-14 NOTE — Telephone Encounter (Signed)
Pt is returning call.  

## 2022-08-14 NOTE — Telephone Encounter (Signed)
Noted  

## 2022-08-14 NOTE — Telephone Encounter (Signed)
Called patient and told her that Dr Stanford Breed said to do what the ENT recommends concerning when to restart Eliquis. She verbalized understanding. She said she will call and give an update after appt.

## 2022-08-14 NOTE — Telephone Encounter (Signed)
Pt c/o swelling: STAT is pt has developed SOB within 24 hours   If swelling, where is the swelling located? ankles   How much weight have you gained and in what time span? Has not gained weight   Have you gained 3 pounds in a day or 5 pounds in a week? no   Do you have a log of your daily weights (if so, list)? no   Are you currently taking a fluid pill? Yes, 40 mg every day   Are you currently SOB? no   Have you traveled recently? no     Patient calling to speak with Hilda Blades about the swelling in her ankles. She says they are still puffed up. She also says she had a bad nose bleed and has to wear a "balloon thing" in her nose.      (A nasal balloon was placed during visit to ER 08/11/22 due to nosebleed and anticoagulant therapy. Given antibiotic prophylaxis- Keflex and ENT follow-up/referral)  Called pt to discuss her concerns. No answer, left message and call back number

## 2022-08-15 NOTE — Telephone Encounter (Signed)
Spoke with pt, Aware of dr crenshaw's recommendations.  °

## 2022-08-18 ENCOUNTER — Ambulatory Visit
Admission: EM | Admit: 2022-08-18 | Discharge: 2022-08-18 | Disposition: A | Discharge status: Home / Self Care | Payer: Medicare Other | Attending: Family Medicine | Admitting: Family Medicine

## 2022-08-18 ENCOUNTER — Telehealth: Payer: Self-pay

## 2022-08-18 ENCOUNTER — Encounter: Payer: Self-pay | Admitting: Emergency Medicine

## 2022-08-18 ENCOUNTER — Ambulatory Visit: Payer: Medicare Other | Admitting: Family Medicine

## 2022-08-18 DIAGNOSIS — Z87898 Personal history of other specified conditions: Secondary | ICD-10-CM | POA: Diagnosis not present

## 2022-08-18 DIAGNOSIS — R42 Dizziness and giddiness: Secondary | ICD-10-CM

## 2022-08-18 DIAGNOSIS — R110 Nausea: Secondary | ICD-10-CM

## 2022-08-18 MED ORDER — MECLIZINE HCL 25 MG PO TABS: ORAL_TABLET | ORAL | 0 refills | Status: DC

## 2022-08-18 MED ORDER — ONDANSETRON HCL 4 MG PO TABS: 4.0000 mg | ORAL_TABLET | Freq: Three times a day (TID) | ORAL | 0 refills | Status: DC | PRN

## 2022-08-18 NOTE — Telephone Encounter (Signed)
Forms from [facility] home has been placed in [name] to be signed folder

## 2022-08-18 NOTE — ED Triage Notes (Signed)
Pt has a hx of vertigo - worse recently  Unable to get an appointment w/ her PCP today  Hx of nose bleed on 08/11/22 Started back on Eliquis on Saturday (210/24)

## 2022-08-18 NOTE — Discharge Instructions (Addendum)
Please try to drink more water. I will refill your medicine for nausea.  May take ondansetron 4 mg 2 x a day I have refilled your meclizine.  You may take this 2-3 times a day. Please try to drink more water and fluids. Call your doctor if you fail to improve

## 2022-08-18 NOTE — ED Provider Notes (Signed)
[name]    CSN: [id number] Arrival date & time: 08/18/22  1842      History   Chief Complaint Chief Complaint  Patient presents with   Dizziness    HPI [name] is a 77 y.o. female.   HPI  Patient is known to me from prior encounter.  She has multiple complaints.  Multiple fears about how to manage her medical conditions and her future.  Recently placed back on Eliquis and had a nosebleed.  Has not felt well since that time.  Worries that she lost a lot of blood.  Has seen her ENT in follow-up.  States that she feels lightheaded.  Has had spells of nausea.  She has meclizine at home.  She has Zofran at home.  She usually uses these with good results.  She states that she spent most of today in bed because every time she got up she felt dizzy and nauseated.  No falls  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[place]) ~ 2003   "right"   Mitral regurgitation    Osteopenia 02/18/2012   Osteoporosis    PAF (paroxysmal atrial fibrillation) ([place]) 2012   Peripheral neuropathy 11/06/2011   PMDD (premenstrual dysphoric disorder)    PONV (postoperative nausea and vomiting)    nausea, vomiting, hives and dizziness    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/2020   Complete bilateral atrial lesion set using cryothermy and bipolar radiofrequency ablation with clipping of LA appendage via right mini-thoracotomy approach   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   Complex  valvuloplasty including artificial Gore-tex neochord placement x12 with 67m Sorin Memo 4D ring annuloplasty   Seasonal allergies    Vaginal delivery    ONE NSVD   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    Patient Active Problem List   Diagnosis Date Noted   Dysphagia 07/15/2022   Chronic Eustachian tube dysfunction, bilateral 07/15/2022   Nodule of right lung 02/06/2022   Closed fracture of left patella with routine healing 11/21/2021   Closed fracture of lateral portion of left tibial plateau 11/07/2021   Tachycardia 10/17/2021   HTN (hypertension) 06/20/2021   Visit for wound check 04/29/2021   Therapeutic opioid-induced constipation (OIC) 04/03/2021   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   Atrial fibrillation ([place] 02/09/2020   Long term (current) use of anticoagulants 02/09/2020   Acute blood loss anemia 02/09/2020   Debility 02/02/2020   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/2020   Severe mitral regurgitation    Mitral regurgitation due to cusp prolapse    Ankle swelling, left 10/20/2019   Ankle swelling, right 10/20/2019   Secondary hypercoagulable state ([place] 08/19/2019   Left inguinal hernia 03/11/2019   Visit for monitoring Tikosyn therapy 03/29/2018   Osteoporosis 09/23/2017   Fatigue 02/18/2017   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   Senile osteoporosis 08/01/2016   Physical exam  04/21/2016   Positional lightheadedness 11/16/2015   B12 deficiency 10/01/2015   Hearing loss due to cerumen impaction 08/23/2015   Foot pain, right 05/23/2015   Severe protein-calorie malnutrition ([place]) 10/19/2014   Fracture of multiple ribs 08/07/2014   Shortness of breath 08/06/2014   Constipation 07/14/2014   Vitamin B12 deficiency anemia, unspecified 06/19/2014   Hypokalemia 06/19/2014   Other chronic pain 06/19/2014   Zoster without complications [id number]   Vitreous degeneration, bilateral 06/19/2014   Nutritional deficiency, unspecified 06/19/2014    Nocturia [id number]   Other symbolic dysfunctions [id number]   Unsteadiness on feet 06/19/2014   Unspecified osteoarthritis, unspecified site 06/19/2014   Muscle weakness (generalized) 06/19/2014   Major depressive disorder, single episode, unspecified 06/19/2014   Frequency of micturition 06/19/2014   Encounter for orthopedic aftercare following scoliosis surgery 06/19/2014   Cardiac arrhythmia, unspecified 06/19/2014   Age-related nuclear cataract, unspecified eye 06/19/2014   Hyperlipidemia 06/08/2013   Lumbar back pain 05/19/2012   Ureteric stone 02/18/2012   Leg pain, bilateral 11/17/2011   Hereditary and idiopathic peripheral neuropathy 11/06/2011   S/P cervical spinal fusion 11/06/2011   Gastro-esophageal reflux disease without esophagitis 11/06/2011   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 11/06/2011   Personal history of other diseases of the circulatory system 11/06/2011   Melanoma of skin ([place]) 11/06/2011   History of cardiovascular disorder 11/06/2011   Chronic interstitial cystitis 04/09/2011   Allergic rhinitis 04/25/2010   Vulvodynia, 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   Anxiety and depression 03/04/2010   Malaise and fatigue 02/05/2010   Vitamin D deficiency 01/28/2010   Neoplasm of uncertain behavior of skin 01/25/2010   Disorder of bone and cartilage 01/25/2010   Genital herpes simplex 11/01/2009   Herpes simplex virus (HSV) infection 11/01/2009   Herpetic vulvovaginitis 10/23/2009   Kidney stone 08/09/2009   Urinary tract infection, site not specified 08/09/2009   Brachial neuritis 07/23/2009   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    Past Surgical History:  Procedure Laterality Date   ANTERIOR CERVICAL DECOMP/DISCECTOMY FUSION  ~ 2003   BACK SURGERY     BREAST SURGERY     BREAST BIOPSY--RIGHT BENIGN   BUNIONECTOMY Bilateral    CARDIOVERSION N/A 01/26/2020   Procedure: CARDIOVERSION;  Surgeon: Geralynn Rile, MD;  Location: [facility];  Service: Cardiovascular;  Laterality: N/A;   COSMETIC SURGERY  2016   "back of my neck; related to earlier fusion"   [place]  "several times"   [place] Right    "removed bone protruding out of my forehead"   HARDWARE REMOVAL  2016   "related to neck OR"   INCONTINENCE SURGERY     IR THORACENTESIS ASP PLEURAL SPACE W/IMG GUIDE  02/16/2020   MINIMALLY INVASIVE MAZE PROCEDURE N/A 01/17/2020   Procedure: MINIMALLY INVASIVE MAZE PROCEDURE;  Surgeon: Rexene Alberts, MD;  Location: [facility];  Service: Open Heart Surgery;  Laterality: N/A;   MITRAL VALVE REPAIR Right 01/17/2020   Procedure: MINIMALLY INVASIVE MITRAL VALVE REPAIR (MVR) USING MEMO 4D 32MM;  Surgeon: Rexene Alberts, MD;  Location: [facility];  Service: Open Heart Surgery;  Laterality: Right;   POSTERIOR CERVICAL FUSION/FORAMINOTOMY  ~ 2008; 2015   RIGHT/LEFT HEART CATH AND CORONARY ANGIOGRAPHY N/A 12/02/2019   Procedure: RIGHT/LEFT HEART CATH AND CORONARY ANGIOGRAPHY;  Surgeon: Burnell Blanks, MD;  Location: [facility];  Service: Cardiovascular;  Laterality: N/A;   SHOULDER ARTHROSCOPY W/ ROTATOR CUFF REPAIR Right  2012   SPINAL FUSION  06/2014 - 2018 X ?7   "scoliosis; my entire back"   TEE WITHOUT CARDIOVERSION N/A 11/21/2019   Procedure: TRANSESOPHAGEAL ECHOCARDIOGRAM (TEE);  Surgeon: Josue Hector, MD;  [facility];  Service: Cardiovascular;  Laterality: N/A;   TEE WITHOUT CARDIOVERSION N/A 01/17/2020   Procedure: TRANSESOPHAGEAL ECHOCARDIOGRAM (TEE);  Surgeon: Rexene Alberts, MD;  Location: [facility];  Service: Open Heart Surgery;  Laterality: N/A;   TEE WITHOUT CARDIOVERSION N/A 01/26/2020   Procedure: TRANSESOPHAGEAL ECHOCARDIOGRAM (TEE);  Surgeon: Geralynn Rile, MD;  Location: [facility];  Service: Cardiovascular;  Laterality: N/A;   TUBAL LIGATION     VAGINAL HYSTERECTOMY     TVH    OB History      Gravida  1   Para  1   Term  1   Preterm      AB      Living  1      SAB      IAB      Ectopic      Multiple      Live Births  1            Home Medications    Prior to Admission medications   Medication Sig Start Date End Date Taking? Authorizing Provider  acetaminophen (TYLENOL) 500 MG tablet Take 500 mg by mouth every 6 (six) hours as needed for moderate pain.     [provider]  apixaban (ELIQUIS) 5 MG TABS tablet Take 1 tablet (5 mg total) by mouth 2 (two) times daily. 08/01/22   Lelon Perla, MD  b complex vitamins tablet Take 1 tablet by mouth daily.    [provider]  Calcium Carbonate Antacid (TUMS CHEWY BITES PO) Take 1 tablet by mouth daily as needed (reflux).     [provider]  Calcium Citrate-Vitamin D (CALCIUM + D PO) Take 1 tablet by mouth daily.    [provider]  Carboxymethylcellul-Glycerin (LUBRICATING EYE DROPS OP) Place 1 drop into both eyes daily as needed (dry eyes).    [provider]  cephALEXin (KEFLEX) 500 MG capsule Take 2 grams (4 tablets) by mouth 1 hour prior to dental cleaning/procedure. 03/27/22   Freada Bergeron, MD  clonazePAM [place]) 0.5 MG tablet Take 1 tablet by mouth twice daily as needed for anxiety 06/11/22   [name], MD  denosumab (PROLIA) 60 MG/ML SOSY injection Inject 60 mg into the skin every 6 (six) months.    [provider]  fluticasone (FLONASE) 50 MCG/ACT nasal spray Place 2 sprays into both nostrils daily. 07/14/22   [name], MD  furosemide (LASIX) 40 MG tablet Take 1 tablet (40 mg total) by mouth daily. 08/08/22   Loel Dubonnet, NP  gabapentin (NEURONTIN) 100 MG capsule Take 100 mg by mouth 2 (two) times daily. 03/13/22   [provider]  levothyroxine (SYNTHROID) 75 MCG tablet Take 1 tablet by mouth once daily 07/28/22   [name], MD  meclizine (ANTIVERT) 25 MG tablet TAKE 1 TABLET BY MOUTH THREE TIMES DAILY  AS NEEDED FOR DIZZINESS 08/18/22   Raylene Everts, MD  metoprolol tartrate (LOPRESSOR) 50 MG tablet Take 1 tablet (50 mg total) by mouth 2 (two) times daily. 07/29/22   Freada Bergeron, MD  Nutritional Supplements (,FEEDING SUPPLEMENT, PROSOURCE PLUS) liquid Take 30 mLs by mouth 2 (two) times daily between meals. 02/08/20   Love, Ivan Anchors, PA-C  nystatin (MYCOSTATIN/NYSTOP)  powder Apply topically 2 (two) times daily as needed (skin irritation (under breasts)). 02/08/20   Love, Ivan Anchors, PA-C  ondansetron (ZOFRAN) 4 MG tablet Take 1 tablet (4 mg total) by mouth every 8 (eight) hours as needed. 08/18/22   Raylene Everts, MD  oxyCODONE (ROXICODONE) 5 MG/5ML solution Take 4 mLs (4 mg total) by mouth every 4 (four) hours as needed for moderate pain. 02/08/20   Love, Ivan Anchors, PA-C  polyethylene glycol (MIRALAX / GLYCOLAX) packet Take 17 g by mouth daily as needed for mild constipation.    [provider]  potassium chloride SA (KLOR-CON M) 20 MEQ tablet Take 1 tablet (20 mEq total) by mouth daily. 07/18/22   Freada Bergeron, MD  sodium chloride (OCEAN) 0.65 % SOLN nasal spray Place 1 spray into both nostrils as needed for congestion (nose bleeds).    [provider]  valACYclovir (VALTREX) 500 MG tablet Take 500 mg by mouth daily as needed (breakouts).  07/15/19   [provider]    Family History Family History  Problem Relation Age of Onset   Diabetes Father    Hyperlipidemia Sister    Heart disease Sister    Stroke Sister    Diabetes Brother    Hyperlipidemia Sister    Heart disease Sister    Arthritis Mother    Heart disease Mother    Uterine cancer Mother    Diabetes Brother    Heart Problems Brother     Social History Social History   Tobacco Use   Smoking status: Former    Packs/day: 0.10    Years: 14.00    Total pack years: 1.40    Types: Cigarettes    Quit date: 1980    Years since quitting: 44.1   Smokeless tobacco: Never   Tobacco  comments:    03/31/2018 "quit ~ 1980; someday smoker when I did smoke; never addicted"  Vaping Use   Vaping Use: Never used  Substance Use Topics   Alcohol use: Never   Drug use: Yes    Types: Oxycodone, Benzodiazepines    Comment: 03/31/2018 "for chronic neck and back pain", takes Klonopin at times.      Allergies   Buprenorphine, Erythromycin, Iodinated contrast media, Latex, Other, Tape, Ciprofloxacin, Duloxetine hcl, Erythromycin base, Hydrocodone, Hydromorphone, Iodine, Levofloxacin, Metrizamide, Nsaids, Nucynta [tapentadol hcl], Oxycodone, Pentazocine, Septra [bactrim], Sulfasalazine, Morphine, Sulfa antibiotics, Sulfamethoxazole-trimethoprim, and Tapentadol   Review of Systems Review of Systems  See HPI Physical Exam Triage Vital Signs ED Triage Vitals  Enc Vitals Group     BP 08/18/22 1856 135/80     Pulse Rate 08/18/22 1856 79     Resp 08/18/22 1856 16     Temp 08/18/22 1856 (!) 97.4 F (36.3 C)     Temp Source 08/18/22 1856 Oral     SpO2 08/18/22 1856 95 %     Weight 08/18/22 1858 116 lb (52.6 kg)     Height 08/18/22 1858 5' 6"$  (1.676 m)     Head Circumference --      Peak Flow --      Pain Score 08/18/22 1858 0     Pain Loc --      Pain Edu? --      Excl. in GC? --    Orthostatic VS for the past 24 hrs:  BP- Lying Pulse- Lying BP- Sitting Pulse- Sitting BP- Standing at 0 minutes Pulse- Standing at 0 minutes  08/18/22 1932 128/76 71 134/84 71 126/75  76    Updated Vital Signs BP 135/80 (BP Location: Left Arm)   Pulse 79   Temp (!) 97.4 F (36.3 C) (Oral)   Resp 16   Ht 5' 6"$  (1.676 m)   Wt 52.6 kg   SpO2 95%   BMI 18.72 kg/m      Physical Exam Constitutional:      General: She is not in acute distress.    Appearance: She is well-developed. She is ill-appearing.     Comments: Very thin and frail.  Concern for health.  HENT:     Head: Normocephalic and atraumatic.     Right Ear: Tympanic membrane and ear canal normal.     Left Ear: Tympanic  membrane and ear canal normal.     Nose:     Comments: Right nares crusted with dried blood    Mouth/Throat:     Mouth: Mucous membranes are dry.     Pharynx: No posterior oropharyngeal erythema.  Eyes:     Conjunctiva/sclera: Conjunctivae normal.     Pupils: Pupils are equal, round, and reactive to light.  Neck:     Vascular: No carotid bruit.  Cardiovascular:     Rate and Rhythm: Normal rate and regular rhythm.     Heart sounds: Normal heart sounds.  Pulmonary:     Effort: Pulmonary effort is normal. No respiratory distress.     Breath sounds: Normal breath sounds.  Abdominal:     General: There is no distension.     Palpations: Abdomen is soft.  Musculoskeletal:        General: Normal range of motion.     Cervical back: Normal range of motion.  Lymphadenopathy:     Cervical: No cervical adenopathy.  Skin:    General: Skin is warm and dry.  Neurological:     General: No focal deficit present.     Mental Status: She is alert.     Gait: Gait normal.     Deep Tendon Reflexes: Reflexes normal.      UC Treatments / Results  Labs (all labs ordered are listed, but only abnormal results are displayed) Labs Reviewed - No data to display  EKG   Radiology No results found.  Procedures Procedures (including critical care time)  Medications Ordered in UC Medications - No data to display  Initial Impression / Assessment and Plan / UC Course  I have reviewed the triage vital signs and the nursing notes.  Pertinent labs & imaging results that were available during my care of the patient were reviewed by me and considered in my medical decision making (see chart for details).     History of vertigo.  Currently has some lightheadedness and nausea.  Feels it may be related to her nosebleed but we did orthostatics and she is not volume depleted.  Her mouth is dry and I think she is a little dehydrated.  This is probably contributing to her symptoms, as well as her  overwhelming anxiety.  She admits that she has trouble swallowing, trouble keeping her calories up.  She has lost weight.  She does have a primary care doctor and goes regularly as well as multiple specialists Final Clinical Impressions(s) / UC Diagnoses   Final diagnoses:  History of epistaxis  Lightheadedness  History of vertigo     Discharge Instructions      Please try to drink more water. I will refill your medicine for nausea.  May take ondansetron 4 mg 2 x a  day I have refilled your meclizine.  You may take this 2-3 times a day. Please try to drink more water and fluids. Call your doctor if you fail to improve     ED Prescriptions     Medication Sig Dispense Auth. Provider   meclizine (ANTIVERT) 25 MG tablet TAKE 1 TABLET BY MOUTH THREE TIMES DAILY AS NEEDED FOR DIZZINESS 30 tablet Raylene Everts, MD   ondansetron (ZOFRAN) 4 MG tablet Take 1 tablet (4 mg total) by mouth every 8 (eight) hours as needed. 30 tablet Raylene Everts, MD      I have reviewed the PDMP during this encounter.   Raylene Everts, MD 08/18/22 1946

## 2022-08-19 ENCOUNTER — Ambulatory Visit: Payer: PRIVATE HEALTH INSURANCE | Admitting: Student

## 2022-08-19 NOTE — Telephone Encounter (Signed)
Forms faxed  and [facility] has been called

## 2022-08-19 NOTE — Telephone Encounter (Signed)
Forms signed and returned to [facility]

## 2022-08-19 NOTE — Telephone Encounter (Signed)
Forms faxed and placed in scan  

## 2022-08-20 ENCOUNTER — Ambulatory Visit (HOSPITAL_COMMUNITY): Payer: Medicare Other

## 2022-08-20 ENCOUNTER — Ambulatory Visit (INDEPENDENT_AMBULATORY_CARE_PROVIDER_SITE_OTHER): Payer: Medicare Other | Admitting: Family Medicine

## 2022-08-20 ENCOUNTER — Encounter: Payer: Self-pay | Admitting: Family Medicine

## 2022-08-20 VITALS — BP 116/80 | HR 74 | Temp 97.4°F | Resp 16 | Ht 66.0 in | Wt 112.0 lb

## 2022-08-20 DIAGNOSIS — R5383 Other fatigue: Secondary | ICD-10-CM

## 2022-08-20 DIAGNOSIS — R42 Dizziness and giddiness: Secondary | ICD-10-CM | POA: Diagnosis not present

## 2022-08-20 DIAGNOSIS — R300 Dysuria: Secondary | ICD-10-CM | POA: Diagnosis not present

## 2022-08-20 LAB — POCT URINALYSIS DIPSTICK
Blood, UA: POSITIVE
Glucose, UA: NEGATIVE
Spec Grav, UA: 1.015 (ref 1.010–1.025)
pH, UA: 5.5 (ref 5.0–8.0)

## 2022-08-20 NOTE — Patient Instructions (Signed)
Follow up as needed or as scheduled We'll notify you of your lab results and make any changes if needed DECREASE your Metoprolol to 1/2 tab twice daily Make sure you are drinking plenty of fluids and eating regularly Call with any questions or concerns Hang in there!!!

## 2022-08-20 NOTE — Progress Notes (Signed)
   Subjective:    Patient ID: [name], female    DOB: Jul 29, 1945, 77 y.o.   MRN: [id number]  HPI Dizzy- 'i'm real dizzy.  I'm fatigued all the time'.  + nausea.  Pt did have extensive nose bleed on 2/5 and had packing placed.  Has followed up w/ ENT.  Packing was very painful, 'if I had the choice between living and dying at that moment, I would've gone'.  'i can't tell you how bad I feel'.  Pt has palliative care, speech tx, and HH coming to the house.  Pt reports tingling that starts in her neck and goes 'all the way to my toes'.  No dizziness when lying down- occurs with sitting and standing.    + dysuria   Review of Systems For ROS see HPI     Objective:   Physical Exam Vitals reviewed.  Constitutional:      General: She is not in acute distress.    Appearance: She is ill-appearing (frail, elderly).  HENT:     Head: Normocephalic and atraumatic.     Right Ear: Tympanic membrane and ear canal normal.     Left Ear: Tympanic membrane and ear canal normal.  Eyes:     Extraocular Movements: Extraocular movements intact.     Conjunctiva/sclera: Conjunctivae normal.     Pupils: Pupils are equal, round, and reactive to light.  Cardiovascular:     Rate and Rhythm: Normal rate and regular rhythm.     Pulses: Normal pulses.  Pulmonary:     Effort: Pulmonary effort is normal. No respiratory distress.     Breath sounds: Normal breath sounds. No wheezing or rhonchi.  Musculoskeletal:     Cervical back: Rigidity (due to hardware) present.     Right lower leg: No edema.     Left lower leg: No edema.  Skin:    General: Skin is warm and dry.  Neurological:     Mental Status: She is alert and oriented to person, place, and time. Mental status is at baseline.  Psychiatric:     Comments: Anxious, tearful           Assessment & Plan:   Dizziness- new.  Pt is concerned bc she lost 'a lot of blood' when she had her nose bleed.  Will check CBC to r/o anemia.  I suspect the dizziness  is due to her Metoprolol and low BP.  I told her to decrease the Metoprolol to 1/2 tab BID and she is immediately skeptical and fearful to make any changes.  Says she needs cardiology's permission to adjust meds.  I told her that's fine and she can message them, but she then says she can't wait that long and something must be done now.  I told her that I'm not sure how she wants to proceed as she is repeatedly contradicting herself.  She says she will decrease the Metoprolol if she's 'brave enough to do it'.  She admits to poor water intake which is likely compounding the problem.  Will send message to Dr Stanford Breed to alert him to possible med change.  Dysuria- UA w/ some abnormalities but will hold tx until cx results available

## 2022-08-21 ENCOUNTER — Telehealth: Payer: Self-pay | Admitting: Family Medicine

## 2022-08-21 ENCOUNTER — Telehealth: Payer: Self-pay

## 2022-08-21 LAB — CBC WITH DIFFERENTIAL/PLATELET
Basophils Absolute: 0 10*3/uL (ref 0.0–0.1)
Basophils Relative: 0.5 % (ref 0.0–3.0)
Eosinophils Absolute: 0.2 10*3/uL (ref 0.0–0.7)
Eosinophils Relative: 2.7 % (ref 0.0–5.0)
HCT: 40.9 % (ref 36.0–46.0)
Hemoglobin: 13.6 g/dL (ref 12.0–15.0)
Lymphocytes Relative: 24.2 % (ref 12.0–46.0)
Lymphs Abs: 1.8 10*3/uL (ref 0.7–4.0)
MCHC: 33.2 g/dL (ref 30.0–36.0)
MCV: 89.3 fl (ref 78.0–100.0)
Monocytes Absolute: 0.7 10*3/uL (ref 0.1–1.0)
Monocytes Relative: 9.1 % (ref 3.0–12.0)
Neutro Abs: 4.6 10*3/uL (ref 1.4–7.7)
Neutrophils Relative %: 63.5 % (ref 43.0–77.0)
Platelets: 193 10*3/uL (ref 150.0–400.0)
RBC: 4.58 Mil/uL (ref 3.87–5.11)
RDW: 13.4 % (ref 11.5–15.5)
WBC: 7.2 10*3/uL (ref 4.0–10.5)

## 2022-08-21 LAB — BASIC METABOLIC PANEL
BUN: 32 mg/dL — ABNORMAL HIGH (ref 6–23)
CO2: 33 mEq/L — ABNORMAL HIGH (ref 19–32)
Calcium: 10.1 mg/dL (ref 8.4–10.5)
Chloride: 96 mEq/L (ref 96–112)
Creatinine, Ser: 0.9 mg/dL (ref 0.40–1.20)
GFR: 61.43 mL/min (ref >60.00)
Glucose, Bld: 88 mg/dL (ref 70–99)
Potassium: 4.6 mEq/L (ref 3.5–5.1)
Sodium: 142 mEq/L (ref 135–145)

## 2022-08-21 LAB — TSH: TSH: 0.91 u[IU]/mL (ref 0.35–5.50)

## 2022-08-21 LAB — URINE CULTURE
MICRO NUMBER:: 14565321
SPECIMEN QUALITY:: ADEQUATE

## 2022-08-21 NOTE — Telephone Encounter (Signed)
Will with [facility] would like to see if Dr. [name] would be will to hold pt Rx metoprolol tartrate (LOPRESSOR) 50 MG he stated this is what is making the pt have blood pressure issues and is what is making her experience dizziness and light headiness.  He would like to have a call back.

## 2022-08-21 NOTE — Telephone Encounter (Signed)
She also needs to give it more time.  We just decreased her medication yesterday and it takes a few days to wash out of her system.  But agree, if she is symptomatic with her low blood pressure, she should be be evaluated.

## 2022-08-21 NOTE — Telephone Encounter (Signed)
Called and informed pt of lab results . She states her BP has been low today and she is pushing water . Advised pt if her BP does not come up she needs to go to ER .

## 2022-08-21 NOTE — Telephone Encounter (Signed)
Spoke to the pt and advised of [name] above message . She expressed verbal understanding

## 2022-08-21 NOTE — Telephone Encounter (Signed)
Did he leave a call back number ?

## 2022-08-21 NOTE — Telephone Encounter (Signed)
-----   Message from [name], MD sent at 08/21/2022  3:52 PM EST ----- Labs look great!  No changes at this time

## 2022-08-22 ENCOUNTER — Telehealth: Payer: Self-pay | Admitting: Cardiology

## 2022-08-22 ENCOUNTER — Telehealth: Payer: Self-pay

## 2022-08-22 ENCOUNTER — Telehealth: Payer: Self-pay | Admitting: Family Medicine

## 2022-08-22 DIAGNOSIS — R918 Other nonspecific abnormal finding of lung field: Secondary | ICD-10-CM | POA: Diagnosis not present

## 2022-08-22 DIAGNOSIS — E43 Unspecified severe protein-calorie malnutrition: Secondary | ICD-10-CM | POA: Diagnosis not present

## 2022-08-22 DIAGNOSIS — I10 Essential (primary) hypertension: Secondary | ICD-10-CM | POA: Diagnosis not present

## 2022-08-22 DIAGNOSIS — G8929 Other chronic pain: Secondary | ICD-10-CM | POA: Diagnosis not present

## 2022-08-22 NOTE — Telephone Encounter (Signed)
Pt seen results via my chart  

## 2022-08-22 NOTE — Telephone Encounter (Signed)
Spoke to Will from [employer] and pt was on speaker phone he was with Mrs Teichmann . I advised him that we have advised her to give the med change time and if she continues with low BP , lightlessness and dizzy she needs to reach out to her Cardiologist or go to the ER . Pt BP today is 114/76 but she is having dizziness and light headiness . Will  states he will call DR Stanford Breed in regards to this

## 2022-08-22 NOTE — Telephone Encounter (Signed)
Late entry. Spoke to patient   Patient was concerned about her blood pressure being low/ she states she saw her primary on Wednesday .  Primary changed  metoprolol  tartrate to 25 mg  twice a day ( 1/2 tablet of 50 mg)   Patient states soon after she takes morning medications she gets  dizzy .   She wanted parameters on when should she be concerned.  Patient gave RN several blood pressure reading from just this morning 85- 122/60-64.she states she takes her pain medication  and Metoprolol at the same time.  RN informed patient not to take her blood pressure so frequently --  suggest not taking medication at the same time . Take in 1 to 2 hours apart.  Rise slowly sitting  to standing.  Keep hydrated.  Blood pressure range should be above [id number] systolic.   Patient verbalized understanding

## 2022-08-22 NOTE — Telephone Encounter (Signed)
Yes it is in the contact.  This is the number that he was calling from [phone].

## 2022-08-22 NOTE — Telephone Encounter (Signed)
-----   Message from [name], MD sent at 08/22/2022  7:29 AM EST ----- No evidence of UTI.  Great news!

## 2022-08-22 NOTE — Telephone Encounter (Signed)
Agree.  She also needs to be eating regularly to help avoid low blood sugar and dizziness

## 2022-08-22 NOTE — Telephone Encounter (Signed)
STAT if patient feels like he/she is going to faint   Are you dizzy now? Yes   Do you feel faint or have you passed out? No   Do you have any other symptoms? Nausea, SOB  Have you checked your HR and BP (record if available)? 114/76

## 2022-08-22 NOTE — Telephone Encounter (Signed)
Spoke with patient of Dr. Stanford Breed. She reports dizziness - saw PCP for this on 2/14. She decreased metoprolol dose on this day. She reports palliative care came to see her today also. She talked to [employer] about this already this morning so this is a duplicate message. She also called PCP office about this too and notes are in the chart. Advised that she should eat steady meals and monitor for low blood sugar drops, as PCP advised.   Will send to MD/RN

## 2022-08-22 NOTE — Telephone Encounter (Signed)
New Message:     Patient says she needs to talk to the nurse asaap please. She said her blood pressure was real low and she was nervous. She said she is supposed to called when it is low. I asked her what was her blood pressure was. She said she could not remember at this time, sshe knew it was under 100.    Pt c/o BP issue: STAT if pt c/o blurred vision, one-sided weakness or slurred speech  1. What are your last 5 BP readings? She did not remember  2. Are you having any other symptoms (ex. Dizziness, headache, blurred vision, passed out)?  dizzy  3. What is your BP issue? High blood pressure

## 2022-08-22 NOTE — Assessment & Plan Note (Signed)
Ongoing issue for pt.  Likely multifactorial given her multiple medical issues and ongoing anxiety/depression.  Check labs to r/o metabolic cause.

## 2022-08-22 NOTE — Telephone Encounter (Signed)
Will with [facility] is calling in to let us know with the pt having a medication change today her BP is 114/76 with lightheadedness and dizzy pt has been drinking a lot of water.

## 2022-08-25 NOTE — Telephone Encounter (Signed)
This was taken care of on Friday 08/22/22. See note

## 2022-08-26 ENCOUNTER — Ambulatory Visit (HOSPITAL_COMMUNITY): Payer: Medicare Other

## 2022-08-26 ENCOUNTER — Telehealth: Payer: Self-pay | Admitting: Pulmonary Disease

## 2022-08-26 NOTE — Telephone Encounter (Signed)
Please let patient know that her ONO showed she spent 1 hour 51 minutes with an SpO2 less than 88%. Her oxygen desaturation index was 37 events per hour. Studies have shown that the ODI is a high predictor of sleep disordered breathing such as sleep apnea. Given her medications and these results, I would recommend she be scheduled for an in lab sleep study for further evaluation. If she is not able to do an in lab sleep study, then I would recommend scheduling her for a home study.   Thanks, JD

## 2022-08-28 ENCOUNTER — Ambulatory Visit (HOSPITAL_COMMUNITY): Payer: Medicare Other

## 2022-08-29 DIAGNOSIS — G243 Spasmodic torticollis: Secondary | ICD-10-CM | POA: Diagnosis not present

## 2022-08-29 DIAGNOSIS — M4120 Other idiopathic scoliosis, site unspecified: Secondary | ICD-10-CM | POA: Diagnosis not present

## 2022-08-29 DIAGNOSIS — M5412 Radiculopathy, cervical region: Secondary | ICD-10-CM | POA: Diagnosis not present

## 2022-08-29 DIAGNOSIS — R0602 Shortness of breath: Secondary | ICD-10-CM | POA: Diagnosis not present

## 2022-08-29 DIAGNOSIS — M25511 Pain in right shoulder: Secondary | ICD-10-CM | POA: Diagnosis not present

## 2022-08-29 DIAGNOSIS — Z981 Arthrodesis status: Secondary | ICD-10-CM | POA: Diagnosis not present

## 2022-08-29 DIAGNOSIS — M542 Cervicalgia: Secondary | ICD-10-CM | POA: Diagnosis not present

## 2022-08-29 DIAGNOSIS — R2689 Other abnormalities of gait and mobility: Secondary | ICD-10-CM | POA: Diagnosis not present

## 2022-08-29 DIAGNOSIS — G249 Dystonia, unspecified: Secondary | ICD-10-CM | POA: Diagnosis not present

## 2022-08-31 ENCOUNTER — Encounter: Payer: Self-pay | Admitting: Family Medicine

## 2022-09-01 ENCOUNTER — Telehealth: Payer: Self-pay | Admitting: Cardiology

## 2022-09-01 NOTE — Telephone Encounter (Signed)
Pt states she dropped off her patient assistance but they are requesting additional information and she would like to provide that info. Please advise.

## 2022-09-01 NOTE — Telephone Encounter (Signed)
**Note De-Identified [name] Obfuscation** The pt states that [employer] advised her to contact Medicare for a form. She states that she called Medicare and was advised that [employer] has to give her the for.  I advised her that I think [employer] is saying that she has to apply for and be denied for low income subsidy before they will approve her for Eliquis assistance.  She states that when she called the phone number given to her by [facility] that she was advised by medicare that [employer] has to give her that form.  I advised her to call [employer] back to get a better understanding of what they need her to do and that someone there will assist her as they are very helpful.  She states that she is calling them back today and will call me back to update me and/or needs my assistance.  She thanked me for returning her call.

## 2022-09-02 ENCOUNTER — Ambulatory Visit (HOSPITAL_COMMUNITY): Payer: Medicare Other

## 2022-09-02 NOTE — Telephone Encounter (Signed)
**Note De-Identified [name] Obfuscation** The pt called me back this morning and stated that she is still confused about what [employer] is telling her they need from her.  I s/w our pharmacy team and one of our a social workers and was advised to advise the pt to connect with her county [employer] (that is the seniors health insurance information program) which for her is,  [facility] and their phone number is [phone].  I called the pt and advised her to call the above number and that she can apply for extra help over the phone (that is the colloquial name for low income subsidy).  She thanked me for my assistance and is aware to call me to let me know if she needs any further assistance with this.

## 2022-09-03 ENCOUNTER — Telehealth: Payer: Self-pay | Admitting: Cardiology

## 2022-09-03 DIAGNOSIS — G894 Chronic pain syndrome: Secondary | ICD-10-CM | POA: Diagnosis not present

## 2022-09-03 DIAGNOSIS — M5412 Radiculopathy, cervical region: Secondary | ICD-10-CM | POA: Diagnosis not present

## 2022-09-03 DIAGNOSIS — M961 Postlaminectomy syndrome, not elsewhere classified: Secondary | ICD-10-CM | POA: Diagnosis not present

## 2022-09-03 DIAGNOSIS — M4693 Unspecified inflammatory spondylopathy, cervicothoracic region: Secondary | ICD-10-CM | POA: Diagnosis not present

## 2022-09-03 DIAGNOSIS — Z79891 Long term (current) use of opiate analgesic: Secondary | ICD-10-CM | POA: Diagnosis not present

## 2022-09-03 NOTE — Telephone Encounter (Signed)
Pt c/o Shortness Of Breath: STAT if SOB developed within the last 24 hours or pt is noticeably SOB on the phone  1. Are you currently SOB (can you hear that pt is SOB on the phone)? No  2. How long have you been experiencing SOB? For months  3. Are you SOB when sitting or when up moving around? Moving around and sitting  4. Are you currently experiencing any other symptoms? Dizziness

## 2022-09-03 NOTE — Telephone Encounter (Signed)
Called and spoke with patient. She verbalized understanding. I explained to her the purpose of the sleep study and she would prefer the home sleep test. She wants to come in for an appt to discuss the ONO results in details. I attempted to get her scheduled for 3/7 at 845am but she stated that this appt was too early in the morning for her. She does not want to see a NP.   Dr. Erin Fulling, would you be ok with her being scheduled at 1130 on 3/7?

## 2022-09-03 NOTE — Telephone Encounter (Signed)
Pt called to say that she is short of breath, has always been short of breath. She has been seen by Pulmonology- uses a nebulizer, has a follow up appt with pulmonology soon.  She says that she has been seen by several other providers/specialties and PCP. She was just wondering if maybe she could be seen earlier by her Heart Dr to see if her sob has anything to do with heart.   She says that her dizziness has become worse over the past few weeks.  Palliative care does BP checks daily- systolic never under [id number]. She does daily weights.  She just wants to come and see Dr Stanford Breed a little earlier than 09/24/22. She does not want to see an APP, which I offered to her; she has appointments almost every day with different providers.   Scheduled an appt with Dr Stanford Breed 09/11/22, which is a little sooner. Told her to call with any worsening s/s and if she becomes dizzy/feels like she is going to pass out, then she needs to call 911. Told her to stay hydrated and to make sure that she writes down her s/s and questions to take to her appts.   She verbalized understanding.

## 2022-09-04 ENCOUNTER — Ambulatory Visit (HOSPITAL_COMMUNITY): Payer: Medicare Other

## 2022-09-04 DIAGNOSIS — I499 Cardiac arrhythmia, unspecified: Secondary | ICD-10-CM | POA: Diagnosis not present

## 2022-09-04 DIAGNOSIS — I10 Essential (primary) hypertension: Secondary | ICD-10-CM | POA: Diagnosis not present

## 2022-09-04 NOTE — Telephone Encounter (Signed)
Would she be ok with a video visit on 3/7 at 11:30am?   Thanks, JD

## 2022-09-05 DIAGNOSIS — R040 Epistaxis: Secondary | ICD-10-CM | POA: Diagnosis not present

## 2022-09-05 NOTE — Telephone Encounter (Signed)
PT called and would like meet in person w/Dr. Erin Fulling. I set an appt for 3/19. Nothing further needed.

## 2022-09-09 ENCOUNTER — Ambulatory Visit (HOSPITAL_COMMUNITY): Payer: Medicare Other

## 2022-09-10 DIAGNOSIS — G243 Spasmodic torticollis: Secondary | ICD-10-CM | POA: Diagnosis not present

## 2022-09-10 DIAGNOSIS — G609 Hereditary and idiopathic neuropathy, unspecified: Secondary | ICD-10-CM | POA: Diagnosis not present

## 2022-09-11 ENCOUNTER — Ambulatory Visit: Payer: Medicare Other | Attending: Cardiology | Admitting: Cardiology

## 2022-09-11 ENCOUNTER — Ambulatory Visit (HOSPITAL_COMMUNITY): Payer: Medicare Other

## 2022-09-11 ENCOUNTER — Encounter: Payer: Self-pay | Admitting: Cardiology

## 2022-09-11 VITALS — BP 96/58 | HR 75 | Ht 66.0 in | Wt 113.6 lb

## 2022-09-11 DIAGNOSIS — I5032 Chronic diastolic (congestive) heart failure: Secondary | ICD-10-CM | POA: Diagnosis not present

## 2022-09-11 DIAGNOSIS — I480 Paroxysmal atrial fibrillation: Secondary | ICD-10-CM | POA: Diagnosis not present

## 2022-09-11 DIAGNOSIS — Z9889 Other specified postprocedural states: Secondary | ICD-10-CM | POA: Diagnosis not present

## 2022-09-11 DIAGNOSIS — R0602 Shortness of breath: Secondary | ICD-10-CM | POA: Diagnosis not present

## 2022-09-11 DIAGNOSIS — Z954 Presence of other heart-valve replacement: Secondary | ICD-10-CM

## 2022-09-11 MED ORDER — METOPROLOL TARTRATE 25 MG PO TABS: 12.5000 mg | ORAL_TABLET | Freq: Two times a day (BID) | ORAL | 3 refills | Status: DC

## 2022-09-11 NOTE — Progress Notes (Signed)
HPI: Follow-up chronic dyspnea, previous mitral valve repair, paroxysmal atrial fibrillation.  Patient underwent minimally invasive mitral valve repair, complete maze procedure and sternotomy with repair of left ventricular free wall laceration July 2021.  Note preoperative catheterization May 2021 showed 20% right coronary artery and 20% distal LAD.  She has had difficulties with continuing dyspnea and chest pain based on reviewing records.  Most recent echocardiogram August 2023 showed normal LV function, flattened septum in systole consistent with RV pressure overload, moderate RV dysfunction, mild right ventricular enlargement, mild right atrial enlargement, status post mitral valve repair with no mitral stenosis and trace mitral regurgitation, mild aortic insufficiency.  Chest CT August 2023 showed right middle lobe and left upper lobe nodularity suggestive of atypical infection, nodule in the left major fissure.  ProBNP June 03, 2022 1282 and creatinine 0.89.  Seen recently with epistaxis.  Contacted the office with multiple complaints including dizziness and shortness of breath and had to my schedule today.  Since last seen she complains of dizziness with standing predominantly.  She continues to have dyspnea with activities.  No orthopnea or PND.  Occasional minimal pedal edema.  She denies chest pain.  She has not had syncope.  Current Outpatient Medications  Medication Sig Dispense Refill   acetaminophen (TYLENOL) 500 MG tablet Take 500 mg by mouth every 6 (six) hours as needed for moderate pain.      apixaban (ELIQUIS) 5 MG TABS tablet Take 1 tablet (5 mg total) by mouth 2 (two) times daily. 60 tablet 5   b complex vitamins tablet Take 1 tablet by mouth daily.     Calcium Carbonate Antacid (TUMS CHEWY BITES PO) Take 1 tablet by mouth daily as needed (reflux).      Calcium Citrate-Vitamin D (CALCIUM + D PO) Take 1 tablet by mouth daily.     Carboxymethylcellul-Glycerin (LUBRICATING EYE  DROPS OP) Place 1 drop into both eyes daily as needed (dry eyes).     cephALEXin (KEFLEX) 500 MG capsule Take 2 grams (4 tablets) by mouth 1 hour prior to dental cleaning/procedure. 4 capsule 2   clonazePAM (KLONOPIN) 0.5 MG tablet Take 1 tablet by mouth twice daily as needed for anxiety 30 tablet 3   denosumab (PROLIA) 60 MG/ML SOSY injection Inject 60 mg into the skin every 6 (six) months.     furosemide (LASIX) 40 MG tablet Take 1 tablet (40 mg total) by mouth daily. 30 tablet 1   levothyroxine (SYNTHROID) 75 MCG tablet Take 1 tablet by mouth once daily 90 tablet 0   meclizine (ANTIVERT) 25 MG tablet TAKE 1 TABLET BY MOUTH THREE TIMES DAILY AS NEEDED FOR DIZZINESS 30 tablet 0   metoprolol tartrate (LOPRESSOR) 50 MG tablet Take 1 tablet (50 mg total) by mouth 2 (two) times daily. 180 tablet 3   mupirocin ointment (BACTROBAN) 2 % Apply 1 Application topically daily.     Nutritional Supplements (,FEEDING SUPPLEMENT, PROSOURCE PLUS) liquid Take 30 mLs by mouth 2 (two) times daily between meals. 887 mL 0   nystatin (MYCOSTATIN/NYSTOP) powder Apply topically 2 (two) times daily as needed (skin irritation (under breasts)). 15 g 0   ondansetron (ZOFRAN) 4 MG tablet Take 1 tablet (4 mg total) by mouth every 8 (eight) hours as needed. 30 tablet 0   oxyCODONE (ROXICODONE) 5 MG/5ML solution Take 4 mLs (4 mg total) by mouth every 4 (four) hours as needed for moderate pain.  0   polyethylene glycol (MIRALAX / GLYCOLAX) packet Take  17 g by mouth daily as needed for mild constipation.     potassium chloride SA (KLOR-CON M) 20 MEQ tablet Take 1 tablet (20 mEq total) by mouth daily. 90 tablet 3   sodium chloride (OCEAN) 0.65 % SOLN nasal spray Place 1 spray into both nostrils as needed for congestion (nose bleeds).     valACYclovir (VALTREX) 500 MG tablet Take 500 mg by mouth daily as needed (breakouts).      fluticasone (FLONASE) 50 MCG/ACT nasal spray Place 2 sprays into both nostrils daily. (Patient not taking:  Reported on 09/11/2022) 16 g 6   gabapentin (NEURONTIN) 100 MG capsule Take 100 mg by mouth 2 (two) times daily. (Patient not taking: Reported on 09/11/2022)     No current facility-administered medications for this visit.     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[place]) ~ 2003   "right"   Mitral regurgitation    Osteopenia 02/18/2012   Osteoporosis    PAF (paroxysmal atrial fibrillation) ([place]) 2012   Peripheral neuropathy 11/06/2011   PMDD (premenstrual dysphoric disorder)    PONV (postoperative nausea and vomiting)    nausea, vomiting, hives and dizziness    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/2020   Complete bilateral atrial lesion set using cryothermy and bipolar radiofrequency ablation with clipping of LA appendage via right mini-thoracotomy approach   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   Complex valvuloplasty including artificial Gore-tex neochord placement x12 with 75m Sorin Memo 4D ring annuloplasty   Seasonal allergies    Vaginal delivery    ONE NSVD   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    Past Surgical History:  Procedure Laterality Date   ANTERIOR CERVICAL DECOMP/DISCECTOMY FUSION  ~ 2003   BACK SURGERY     BREAST SURGERY     BREAST BIOPSY--RIGHT BENIGN   BUNIONECTOMY Bilateral    CARDIOVERSION N/A 01/26/2020   Procedure: CARDIOVERSION;  Surgeon: OGeralynn Rile MD;  [facility]  Service: Cardiovascular;  Laterality: N/A;   COSMETIC SURGERY  2016   "back  of my neck; related to earlier fusion"   [place]    "removed bone protruding out of my forehead"   HARDWARE REMOVAL  2016   "related to neck OR"   INCONTINENCE SURGERY     IR THORACENTESIS ASP PLEURAL SPACE W/IMG GUIDE  02/16/2020   MINIMALLY INVASIVE MAZE PROCEDURE N/A 01/17/2020   Procedure: MINIMALLY INVASIVE MAZE PROCEDURE;  Surgeon: ORexene Alberts MD;  [facility]  Service: Open Heart Surgery;  Laterality: N/A;   MITRAL VALVE REPAIR Right 01/17/2020   Procedure: MINIMALLY INVASIVE MITRAL VALVE REPAIR (MVR) USING MEMO 4D 32MM;  Surgeon: ORexene Alberts MD;  [facility]  Service: Open Heart Surgery;  Laterality: Right;   POSTERIOR CERVICAL FUSION/FORAMINOTOMY  ~ 2008;  2015   RIGHT/LEFT HEART CATH AND CORONARY ANGIOGRAPHY N/A 12/02/2019   Procedure: RIGHT/LEFT HEART CATH AND CORONARY ANGIOGRAPHY;  Surgeon: Burnell Blanks, MD;  Location: [facility];  Service: Cardiovascular;  Laterality: N/A;   SHOULDER ARTHROSCOPY W/ ROTATOR CUFF REPAIR Right 2012   SPINAL FUSION  06/2014 - 2018 X ?7   "scoliosis; my entire back"   TEE WITHOUT CARDIOVERSION N/A 11/21/2019   Procedure: TRANSESOPHAGEAL ECHOCARDIOGRAM (TEE);  Surgeon: Josue Hector, MD;  Location[facility] ENDOSCOPY;  Service: Cardiovascular;  Laterality: N/A;   TEE WITHOUT CARDIOVERSION N/A 01/17/2020   Procedure: TRANSESOPHAGEAL ECHOCARDIOGRAM (TEE);  Surgeon: Rexene Alberts, MD;  Location: [facility];  Service: Open Heart Surgery;  Laterality: N/A;   TEE WITHOUT CARDIOVERSION N/A 01/26/2020   Procedure: TRANSESOPHAGEAL ECHOCARDIOGRAM (TEE);  Surgeon: Geralynn Rile, MD;  Location: [facility];  Service: Cardiovascular;  Laterality: N/A;   TUBAL LIGATION     VAGINAL HYSTERECTOMY     TVH    Social History   Socioeconomic History   Marital status: Divorced    Spouse name: none/divorced   Number of  children: Not on file   Years of education: Not on file   Highest education level: 12th grade  Occupational History   Occupation: retired  Tobacco Use   Smoking status: Former    Packs/day: 0.10    Years: 14.00    Total pack years: 1.40    Types: Cigarettes    Quit date: 1980    Years since quitting: 44.2   Smokeless tobacco: Never   Tobacco comments:    03/31/2018 "quit ~ 1980; someday smoker when I did smoke; never addicted"  Vaping Use   Vaping Use: Never used  Substance and Sexual Activity   Alcohol use: Never   Drug use: Yes    Types: Oxycodone, Benzodiazepines    Comment: 03/31/2018 "for chronic neck and back pain", takes Klonopin at times.    Sexual activity: Not Currently  Other Topics Concern   Not on file  Social History Narrative   Lives by herself.    Social Determinants of Health   Financial Resource Strain: Low Risk  (10/10/2021)   Overall Financial Resource Strain (CARDIA)    Difficulty of Paying Living Expenses: Not hard at all  Food Insecurity: No Food Insecurity (10/10/2021)   Hunger Vital Sign    Worried About Running Out of Food in the Last Year: Never true    Ran Out of Food in the Last Year: Never true  Transportation Needs: No Transportation Needs (10/10/2021)   PRAPARE - Hydrologist (Medical): No    Lack of Transportation (Non-Medical): No  Physical Activity: Inactive (10/10/2021)   Exercise Vital Sign    Days of Exercise per Week: 0 days    Minutes of Exercise per Session: 0 min  Stress: No Stress Concern Present (10/10/2021)   [place]    Feeling of Stress : Not at all  Social Connections: Socially Isolated (10/10/2021)   Social Connection and Isolation Panel [NHANES]    Frequency of Communication with Friends and Family: Twice a week    Frequency of Social Gatherings with Friends and Family: Twice a week    Attends Religious Services: Never    Building surveyor or Organizations: No    Attends Archivist Meetings: Never    Marital Status: Divorced  Human resources officer Violence: Not At Risk (10/10/2021)  Humiliation, Afraid, Rape, and Kick questionnaire    Fear of Current or Ex-Partner: No    Emotionally Abused: No    Physically Abused: No    Sexually Abused: No    Family History  Problem Relation Age of Onset   Diabetes Father    Hyperlipidemia Sister    Heart disease Sister    Stroke Sister    Diabetes Brother    Hyperlipidemia Sister    Heart disease Sister    Arthritis Mother    Heart disease Mother    Uterine cancer Mother    Diabetes Brother    Heart Problems Brother     ROS: no fevers or chills, productive cough, hemoptysis, dysphasia, odynophagia, melena, hematochezia, dysuria, hematuria, rash, seizure activity, orthopnea, PND, pedal edema, claudication. Remaining systems are negative.  Physical Exam: Well-developed well-nourished in no acute distress.  Skin is warm and dry.  HEENT is normal.  Neck is supple.  Chest is clear to auscultation with normal expansion.  Cardiovascular exam is regular rate and rhythm.  Abdominal exam nontender or distended. No masses palpated. Extremities show no edema. neuro grossly intact  ECG-normal sinus rhythm at a rate of 75, normal axis, RV conduction delay, no ST changes.  Personally reviewed  A/P  1 status post mitral valve replacement-recent echocardiogram showed stable replacement.  Continue SBE prophylaxis.  2 chronic dyspnea-patient has had longstanding dyspnea.  She is not volume overloaded on examination.  Continue Lasix at present dose.  She has declined spironolactone and SGLT2 inhibitor.  Also note she has had multiple medication intolerances in the past.  3 paroxysmal atrial fibrillation-she remains in sinus rhythm today.  Continue apixaban and metoprolol.  4 history of nonobstructive coronary artery disease-she has declined statins.  5  hypertension-blood pressure is borderline.  She is describing occasional dizziness that is longstanding but her appears worse with standing.  Symptoms sound orthostatic mediated.  I will decrease metoprolol to 12.5 mg twice daily to allow her blood pressure to run higher and hopefully improve symptoms.  6 anxiety/depression-follow-up primary care.  Kirk Ruths, MD

## 2022-09-11 NOTE — Patient Instructions (Signed)
Medication Instructions:   DECREASE METOPROLOL TO 12.5 MG TWICE DAILY= 1/2 OF THE 25 MG TABLET TWICE DAILY  *If you need a refill on your cardiac medications before your next appointment, please call your pharmacy*   Follow-Up: At [facility], you and your health needs are our priority.  As part of our continuing mission to provide you with exceptional heart care, we have created designated Provider Care Teams.  These Care Teams include your primary Cardiologist (physician) and Advanced Practice Providers (APPs -  Physician Assistants and Nurse Practitioners) who all work together to provide you with the care you need, when you need it.  We recommend signing up for the patient portal called "MyChart".  Sign up information is provided on this After Visit Summary.  MyChart is used to connect with patients for Virtual Visits (Telemedicine).  Patients are able to view lab/test results, encounter notes, upcoming appointments, etc.  Non-urgent messages can be sent to your provider as well.   To learn more about what you can do with MyChart, go to [email or web address].    Your next appointment:   6 month(s)  Provider:   Kirk Ruths, MD

## 2022-09-12 DIAGNOSIS — R040 Epistaxis: Secondary | ICD-10-CM | POA: Diagnosis not present

## 2022-09-12 NOTE — Telephone Encounter (Signed)
PATIENT WAS APPROVED FOR ELIQUIS FROM [id number] TO [id number] APPLICATION CASE # [id number]

## 2022-09-16 ENCOUNTER — Ambulatory Visit (HOSPITAL_COMMUNITY): Payer: Medicare Other

## 2022-09-17 ENCOUNTER — Telehealth: Payer: Self-pay | Admitting: Cardiology

## 2022-09-17 NOTE — Telephone Encounter (Signed)
Pt c/o medication issue:  1. Name of Medication: metoprolol tartrate (LOPRESSOR) 25 MG tablet   2. How are you currently taking this medication (dosage and times per day)? As prescribed   3. Are you having a reaction (difficulty breathing--STAT)?   4. What is your medication issue? Patient is requesting call back to get clarification as to what to be watching for or aware of while taking this medication. Please advise.

## 2022-09-17 NOTE — Telephone Encounter (Signed)
Spoke with pt, when to take blood pressure and when to report low or high blood pressures.

## 2022-09-18 ENCOUNTER — Ambulatory Visit (HOSPITAL_COMMUNITY): Payer: Medicare Other

## 2022-09-18 DIAGNOSIS — Z981 Arthrodesis status: Secondary | ICD-10-CM | POA: Diagnosis not present

## 2022-09-18 DIAGNOSIS — M4802 Spinal stenosis, cervical region: Secondary | ICD-10-CM | POA: Diagnosis not present

## 2022-09-18 DIAGNOSIS — M47812 Spondylosis without myelopathy or radiculopathy, cervical region: Secondary | ICD-10-CM | POA: Diagnosis not present

## 2022-09-19 ENCOUNTER — Telehealth (HOSPITAL_COMMUNITY): Payer: Self-pay

## 2022-09-19 ENCOUNTER — Encounter (HOSPITAL_COMMUNITY): Payer: Self-pay

## 2022-09-19 NOTE — Telephone Encounter (Signed)
Attempted to call patient in regards to Pulmonary Rehab - LM on VM   Sent letter via myChart 

## 2022-09-22 ENCOUNTER — Telehealth (HOSPITAL_COMMUNITY): Payer: Self-pay

## 2022-09-22 NOTE — Telephone Encounter (Signed)
Pt stated that she has been dizzy and she is unable to participate in the pulmonary rehab at the moment and if anything changes she will call back

## 2022-09-23 ENCOUNTER — Ambulatory Visit (HOSPITAL_COMMUNITY): Payer: Medicare Other

## 2022-09-23 ENCOUNTER — Ambulatory Visit (INDEPENDENT_AMBULATORY_CARE_PROVIDER_SITE_OTHER): Payer: Medicare Other | Admitting: Pulmonary Disease

## 2022-09-23 ENCOUNTER — Encounter: Payer: Self-pay | Admitting: Pulmonary Disease

## 2022-09-23 VITALS — BP 136/72 | HR 71 | Ht 66.0 in | Wt 114.0 lb

## 2022-09-23 DIAGNOSIS — R0602 Shortness of breath: Secondary | ICD-10-CM | POA: Diagnosis not present

## 2022-09-23 DIAGNOSIS — J479 Bronchiectasis, uncomplicated: Secondary | ICD-10-CM

## 2022-09-23 NOTE — Patient Instructions (Addendum)
Resume yupelri nebulizer treatments  We will look into your prescription of the yupelri and if it is mail order and pick up at the local pharmacy.   Continue saline spray and vaseline for your nasal passages   Use a nasal clip when you use the nebulizer treatments to prevent medicine from getting into the nose.  Follow up in 4 months.

## 2022-09-23 NOTE — Progress Notes (Signed)
Synopsis: Referred in August 2023 for abnormal CT Chest scan  Subjective:   PATIENT ID: [name] GENDER: female DOB: [birth year], MRN: [id number]  HPI  Chief Complaint  Patient presents with   Follow-up    Wants to discuss ONO results from last month.   [name] is a 77 year old woman with history of mitral regurgitation s/p minimally invassive MV repair and MAZE procedure complicated by LV free wall rupture 10/2019 and scoliosis with 7 spinal surgeries in 2015 who returns to pulmonary clinic for shortness of breath.   She was started on yupelri nebulizer treatments at last visit in attempt to help with her dyspnea given significant bronchodilator response on PFTs. She noted an improvement in her breathing with the nebulizer treatment.   ONO showed 1 hr 51 minutes with SpO2 less than 88%. Her oxygen desaturation index was 37 events per hour. Studies have shown that the ODI is a high predictor of sleep disordered breathing such as sleep apnea. Given her medications and these results, I recommended she be scheduled for an in lab sleep study for further evaluation.   She had issue of epistaxis that she went to the ER on 08/18/22 and had packing in for about 4-5 days. This occurred 2-3 weeks after starting the nebulizer treatments. She has had issues with epistaxis in the past.   OV 07/24/22 She continues to have shortness of breath and she feels it is getting worse. She did not tolerate Stiolto inhaler after last visit due to feeling like she had mucous in her throat. She continues to have issues with swallowing and has been referred to speech therapy by her primary care team.  OV 04/02/22 She did not tolerate stiolto after last visit as she reports choking issues after using it.  She had modified barium swallow 03/11/22 with no overt issues of dysphagia or aspiration risk. She reported taking TUMS to the speech therapist for chest pains with relief. She did start pantoprazole after last visit  which she reports some improvement in her reflux.  She continues to have significant chest, neck, shoulder and back pains.   She is meeting with her surgical team at [facility] and is considering botox injections in her neck.  OV 03/05/22 She reports the dyspnea can occur at rest or with exertion. She has wheezing at night sometimes. She complains of sharp chest pains that are located centrally and goes between her shoulder blades. She has terrible heartburn. She does report trouble swallowing her food/drink. She is using oxycodone every 4 hours for chronic pain due to her neck and back. She has cervical, thoracic and lumbar spine hardware in place.   PFTs 2022 showed normal spirometry but evidence of air trapping and significant bronchodilator response.   She has not noted much benefit from trying albuterol inhaler with her shortness of breath.   She is a former smoker, quit in 1980. She lives alone and is accompanied by her sister today. She is having more difficulty with her ADLs.   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   Complex valvuloplasty including artificial Gore-tex neochord placement x12 with 36mm Sorin Memo 4D ring annuloplasty   Seasonal allergies    Vaginal delivery    ONE NSVD   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     Family History  Problem Relation Age of Onset   Diabetes Father    Hyperlipidemia Sister    Heart disease Sister    Stroke Sister    Diabetes Brother    Hyperlipidemia Sister    Heart disease Sister    Arthritis Mother    Heart disease Mother    Uterine cancer Mother    Diabetes Brother    Heart Problems Brother      Social History   Socioeconomic History   Marital status: Divorced    Spouse name: none/divorced   Number of children: Not on file   Years of education: Not on file   Highest education level: 12th grade  Occupational History   Occupation: retired  Tobacco Use   Smoking status: Former    Packs/day: 0.10    Years: 14.00    Additional pack years: 0.00    Total pack years: 1.40    Types: Cigarettes    Quit date: 1980    Years since quitting: 44.2   Smokeless tobacco: Never   Tobacco comments:    03/31/2018 "quit ~ 1980; someday smoker when I did smoke; never addicted"  Vaping Use   Vaping Use: Never used  Substance and Sexual Activity   Alcohol use: Never   Drug use: Yes    Types: Oxycodone, Benzodiazepines    Comment: 03/31/2018 "for chronic neck and back pain", takes Klonopin at times.    Sexual activity: Not Currently  Other Topics Concern   Not on file  Social History Narrative   Lives by herself.    Social Determinants of Health   Financial Resource Strain: Low Risk  (10/10/2021)   Overall Financial Resource Strain (CARDIA)    Difficulty of Paying Living Expenses: Not hard at all  Food Insecurity: No Food Insecurity (10/10/2021)   Hunger Vital Sign    Worried About  Running Out of Food in the Last Year: Never true    Ran Out of Food in the Last Year: Never true  Transportation Needs: No Transportation Needs (10/10/2021)   PRAPARE - Hydrologist (Medical): No    Lack of Transportation (Non-Medical): No  Physical Activity: Inactive (10/10/2021)   Exercise Vital Sign    Days of Exercise per Week: 0 days    Minutes of Exercise per Session: 0 min  Stress: No Stress Concern Present (10/10/2021)   [place]    Feeling of Stress : Not at all  Social Connections: Socially Isolated (10/10/2021)  Social Licensed conveyancer [NHANES]    Frequency of Communication with Friends and Family: Twice a week    Frequency of Social Gatherings with Friends and Family: Twice a week    Attends Religious Services: Never    Marine scientist or Organizations: No    Attends Archivist Meetings: Never    Marital Status: Divorced  Human resources officer Violence: Not At Risk (10/10/2021)   Humiliation, Afraid, Rape, and Kick questionnaire    Fear of Current or Ex-Partner: No    Emotionally Abused: No    Physically Abused: No    Sexually Abused: No     Allergies  Allergen Reactions   Buprenorphine Nausea Only and Other (See Comments)    sedation and adhesive reaction Other reaction(s): nausea, sedation and adhesive reaction Other reaction(s): nausea, sedation and adhesive reaction, Other (See Comments), Other (See Comments) sedation and adhesive reaction Other reaction(s): nausea, sedation and adhesive reaction sedation and adhesive reaction Other reaction(s): nausea, sedation and adhesive reaction sedation and adhesive reaction Other reaction(s): nausea, sedation and adhesive reaction sedation and adhesive reaction Other reaction(s): nausea, sedation and adhesive reaction sedation and adhesive reaction Other reaction(s): nausea, sedation and adhesive  reaction sedation and adhesive reaction Other reaction(s): nausea, sedation and adhesive reaction   Erythromycin Nausea And Vomiting and Other (See Comments)    Dizziness  Other reaction(s): Other (See Comments) Dizziness  unknown unknown Dizziness  BURNING ALL OVER  BURNING ALL OVER  unknown Dizziness  BURNING ALL OVER    Iodinated Contrast Media Hives, Nausea Only and Rash    unknown unknown unknown   Latex Palpitations, Other (See Comments) and Rash     hives and sores Other reaction(s): hives and sores Other reaction(s):  hives and sores, Other (See Comments), Other (See Comments)  hives and sores Other reaction(s): hives and sores hives hives  hives and sores Other reaction(s): hives and sores hives Sores  hives and sores Other reaction(s): hives and sores hives Sores  hives and sores Other reaction(s): hives and sores hives  hives and sores Other reaction(s): hives and sores hives Sores  hives and sores Other reaction(s): hives and sores   Other Other (See Comments)    CAN NOT TAKE , aLPRAZOLAM, OR ELAVIL DUE TO AFIB FOR ANXIETY  IV CONTRAST /"DYE". Other reaction(s): hives and sores Other reaction(s): Other (See Comments), Other (See Comments) CAN NOT TAKE , aLPRAZOLAM, OR ELAVIL DUE TO AFIB FOR ANXIETY  IV CONTRAST /"DYE". Other reaction(s): hives and sores IV CONTRAST /"DYE". CAN NOT TAKE , aLPRAZOLAM, OR ELAVIL DUE TO AFIB FOR ANXIETY  IV CONTRAST /"DYE". Other reaction(s): hives and sores Patient states that every time she has anesthesia, she wakes up vomiting, has chills, blood pressure drops CAN NOT TAKE , aLPRAZOLAM, OR ELAVIL DUE TO AFIB FOR ANXIETY  IV CONTRAST /"DYE". Other reaction(s): hives and sores IV CONTRAST /"DYE". Patient states that every time she has anesthesia, she wakes up vomiting, has chills, blood pressure drops CAN NOT TAKE , aLPRAZOLAM, OR ELAVIL DUE TO AFIB FOR ANXIETY  IV CONTRAST /"DYE". Other reaction(s): hives  and sores IV CONTRAST /"DYE". CAN NOT TAKE , aLPRAZOLAM, OR ELAVIL DUE TO AFIB FOR ANXIETY  IV CONTRAST /"DYE". Other reaction(s): hives and sores Patient states that every time she has anesthesia, she wakes up vomiting, has chills, blood pressure drops CAN NOT TAKE , aLPRAZOLAM, OR ELAVIL DUE TO AFIB FOR ANXIETY  IV CONTRAST /"DYE". Other reaction(s): hives and sores   Tape  Rash, Other (See Comments) and Itching    Heart monitor stickers must be rotated in order to prevent rash Other reaction(s): hives and sores Adhesive on EKG tabs=BURNS SKIN**PAPER TAPE OK** sores Adhesive on EKG tabs. Per Pt, Ok w/Paper Tape Adhesive on EKG tabs=BURNS SKIN**PAPER TAPE OK** sores Other reaction(s): Other (See Comments) Heart monitor stickers must be rotated in order to prevent rash Adhesive on EKG tabs=BURNS SKIN**PAPER TAPE OK** sores   Ciprofloxacin Other (See Comments)    Other reaction(s): Other (See Comments) Burning all over Burning all over Burning all over   Duloxetine Hcl     Severe diarrhea and upset stomach Other reaction(s): Other (See Comments), Unknown Severe diarrhea and upset stomach Severe diarrhea and upset stomach Severe diarrhea and upset stomach   Erythromycin Base     Other reaction(s): Other (See Comments), Unknown   Hydrocodone Other (See Comments)    Sedation,dizziness, and nausea Other reaction(s): sedation and nausea Other reaction(s): sedation and nausea Other reaction(s): Other (See Comments), Other (See Comments), sedation and nausea Sedation,dizziness, and nausea Other reaction(s): sedation and nausea Other reaction(s): sedation and nausea Sedation,dizziness, and nausea Other reaction(s): sedation and nausea Other reaction(s): sedation and nausea Sedation,dizziness, and nausea Other reaction(s): sedation and nausea Other reaction(s): sedation and nausea Sedation,dizziness, and nausea Other reaction(s): sedation and nausea Other reaction(s):  sedation and nausea Sedation,dizziness, and nausea Other reaction(s): sedation and nausea Other reaction(s): sedation and nausea Sedation,dizziness, and nausea Other reaction(s): sedation and nausea Other reaction(s): sedation and nausea   Hydromorphone Other (See Comments)    Sedation,dizziness, and nausea Other reaction(s): sedation and nausea Other reaction(s): sedation and nausea Other reaction(s): Other (See Comments), Other (See Comments), sedation and nausea Sedation,dizziness, and nausea Other reaction(s): sedation and nausea Other reaction(s): sedation and nausea Sedation,dizziness, and nausea Other reaction(s): sedation and nausea Other reaction(s): sedation and nausea Sedation,dizziness, and nausea Other reaction(s): sedation and nausea Other reaction(s): sedation and nausea Sedation,dizziness, and nausea Other reaction(s): sedation and nausea Other reaction(s): sedation and nausea Sedation,dizziness, and nausea Other reaction(s): sedation and nausea Other reaction(s): sedation and nausea Sedation,dizziness, and nausea Other reaction(s): sedation and nausea Other reaction(s): sedation and nausea   Iodine     unknown Other reaction(s): Other (See Comments) unknown unknown unknown unknown unknown unknown   Levofloxacin Nausea And Vomiting and Nausea Only   Metrizamide Hives   Nsaids     CANNOT TAKE PER CARDIOLOGIST DUE TO AFIB    Nucynta [Tapentadol Hcl]     nausea and sedation   Oxycodone Other (See Comments)    Delusions (intolerance)  PILLS ONLY sedation, dizziness, nausea,  and itching Other reaction(s): sedation and nausea Other reaction(s): sedation and nausea Other reaction(s): Hallucinations, Other Other reaction(s): Other (See Comments), sedation and nausea Delusions (intolerance)  PILLS ONLY sedation, dizziness, nausea,  and itching Other reaction(s): sedation and nausea Other reaction(s): sedation and nausea Delusions (intolerance)  PILLS ONLY  sedation, dizziness, nausea,  and itching Other reaction(s): sedation and nausea Other reaction(s): sedation and nausea Delusions (intolerance)  PILLS ONLY sedation, dizziness, nausea,  and itching Other reaction(s): sedation and nausea Other reaction(s): sedation and nausea Delusions (intolerance)  PILLS ONLY sedation, dizziness, nausea,  and itching Other reaction(s): sedation and nausea Other reaction(s): sedation and nausea Delusions (intolerance)  PILLS ONLY sedation, dizziness, nausea,  and itching Other reaction(s): sedation and nausea Other reaction(s): sedation and nausea Delusions (intolerance)  PILLS ONLY sedation, dizziness, nausea,  and itching Other reaction(s): sedation and nausea Other reaction(s): sedation and nausea   Pentazocine Other (  See Comments)    Unknown Other reaction(s): Unknown Other reaction(s): Unknown Other reaction(s): Other (See Comments), Other (See Comments), Unknown Unknown Other reaction(s): Unknown Other reaction(s): Unknown Unknown Other reaction(s): Unknown Other reaction(s): Unknown Unknown Other reaction(s): Unknown Other reaction(s): Unknown Unknown Other reaction(s): Unknown Other reaction(s): Unknown Unknown Other reaction(s): Unknown Other reaction(s): Unknown Unknown Other reaction(s): Unknown Other reaction(s): Unknown   Septra [Bactrim]     Hives    Sulfasalazine Hives   Morphine Anxiety, Other (See Comments) and Nausea Only    sedation and nausea Other reaction(s): sedation and nausea Other reaction(s): sedation and nausea Other reaction(s): Delusions (intolerance), Other (See Comments), Other (See Comments), sedation and nausea sedation and nausea Other reaction(s): sedation and nausea Other reaction(s): sedation and nausea sedation and nausea Other reaction(s): sedation and nausea Other reaction(s): sedation and nausea PT CANNOT WAKE UP AFTER TAKING MEDICATION AND HAS NIGHTMARES  sedation and nausea Other  reaction(s): sedation and nausea Other reaction(s): sedation and nausea PT CANNOT WAKE UP AFTER TAKING MEDICATION AND HAS NIGHTMARES  sedation and nausea Other reaction(s): sedation and nausea Other reaction(s): sedation and nausea sedation and nausea Other reaction(s): sedation and nausea Other reaction(s): sedation and nausea PT CANNOT WAKE UP AFTER TAKING MEDICATION AND HAS NIGHTMARES  sedation and nausea Other reaction(s): sedation and nausea Other reaction(s): sedation and nausea   Sulfa Antibiotics Hives, Nausea Only and Rash    rash rash rash   Sulfamethoxazole-Trimethoprim Rash    Hives  Hives  Hives  Hives  Hives  Hives    Tapentadol Nausea Only    Other reaction(s): nausea and sedation, Other (See Comments) nausea and sedation nausea and sedation     Outpatient Medications Prior to Visit  Medication Sig Dispense Refill   acetaminophen (TYLENOL) 500 MG tablet Take 500 mg by mouth every 6 (six) hours as needed for moderate pain.      apixaban (ELIQUIS) 5 MG TABS tablet Take 1 tablet (5 mg total) by mouth 2 (two) times daily. 60 tablet 5   b complex vitamins tablet Take 1 tablet by mouth daily.     Calcium Carbonate Antacid (TUMS CHEWY BITES PO) Take 1 tablet by mouth daily as needed (reflux).      Calcium Citrate-Vitamin D (CALCIUM + D PO) Take 1 tablet by mouth daily.     Carboxymethylcellul-Glycerin (LUBRICATING EYE DROPS OP) Place 1 drop into both eyes daily as needed (dry eyes).     cephALEXin (KEFLEX) 500 MG capsule Take 2 grams (4 tablets) by mouth 1 hour prior to dental cleaning/procedure. 4 capsule 2   clonazePAM (KLONOPIN) 0.5 MG tablet Take 1 tablet by mouth twice daily as needed for anxiety 30 tablet 3   denosumab (PROLIA) 60 MG/ML SOSY injection Inject 60 mg into the skin every 6 (six) months.     furosemide (LASIX) 40 MG tablet Take 1 tablet (40 mg total) by mouth daily. 30 tablet 1   levothyroxine (SYNTHROID) 75 MCG tablet Take 1 tablet by mouth once  daily 90 tablet 0   meclizine (ANTIVERT) 25 MG tablet TAKE 1 TABLET BY MOUTH THREE TIMES DAILY AS NEEDED FOR DIZZINESS 30 tablet 0   metoprolol tartrate (LOPRESSOR) 25 MG tablet Take 0.5 tablets (12.5 mg total) by mouth 2 (two) times daily. 90 tablet 3   mupirocin ointment (BACTROBAN) 2 % Apply 1 Application topically daily.     Nutritional Supplements (,FEEDING SUPPLEMENT, PROSOURCE PLUS) liquid Take 30 mLs by mouth 2 (two) times daily between meals. 887 mL 0  nystatin (MYCOSTATIN/NYSTOP) powder Apply topically 2 (two) times daily as needed (skin irritation (under breasts)). 15 g 0   ondansetron (ZOFRAN) 4 MG tablet Take 1 tablet (4 mg total) by mouth every 8 (eight) hours as needed. 30 tablet 0   oxyCODONE (ROXICODONE) 5 MG/5ML solution Take 4 mLs (4 mg total) by mouth every 4 (four) hours as needed for moderate pain.  0   polyethylene glycol (MIRALAX / GLYCOLAX) packet Take 17 g by mouth daily as needed for mild constipation.     potassium chloride SA (KLOR-CON M) 20 MEQ tablet Take 1 tablet (20 mEq total) by mouth daily. 90 tablet 3   sodium chloride (OCEAN) 0.65 % SOLN nasal spray Place 1 spray into both nostrils as needed for congestion (nose bleeds).     valACYclovir (VALTREX) 500 MG tablet Take 500 mg by mouth daily as needed (breakouts).      fluticasone (FLONASE) 50 MCG/ACT nasal spray Place 2 sprays into both nostrils daily. (Patient not taking: Reported on 09/11/2022) 16 g 6   gabapentin (NEURONTIN) 100 MG capsule Take 100 mg by mouth 2 (two) times daily. (Patient not taking: Reported on 09/11/2022)     No facility-administered medications prior to visit.   Review of Systems  Constitutional:  Negative for chills, fever, malaise/fatigue and weight loss.  HENT:  Negative for congestion, sinus pain and sore throat.   Eyes: Negative.   Respiratory:  Positive for shortness of breath. Negative for cough, hemoptysis, sputum production and wheezing.   Cardiovascular:  Positive for chest pain.  Negative for palpitations, orthopnea, claudication and leg swelling.  Gastrointestinal:  Positive for heartburn. Negative for abdominal pain, nausea and vomiting.  Genitourinary: Negative.   Musculoskeletal:  Negative for joint pain and myalgias.  Skin:  Negative for rash.  Neurological:  Negative for weakness.  Endo/Heme/Allergies: Negative.   Psychiatric/Behavioral:  The patient is nervous/anxious.    Objective:   Vitals:   09/23/22 1109  BP: 136/72  Pulse: 71  SpO2: 96%  Weight: 114 lb (51.7 kg)  Height: 5\' 6"  (1.676 m)   Physical Exam Constitutional:      General: She is not in acute distress.    Appearance: She is not ill-appearing.  HENT:     Head: Normocephalic and atraumatic.  Eyes:     General: No scleral icterus. Cardiovascular:     Rate and Rhythm: Normal rate and regular rhythm.     Pulses: Normal pulses.     Heart sounds: Normal heart sounds. No murmur heard. Pulmonary:     Effort: Pulmonary effort is normal.     Breath sounds: Normal breath sounds. No wheezing, rhonchi or rales.  Musculoskeletal:     Right lower leg: No edema.     Left lower leg: No edema.  Skin:    General: Skin is warm and dry.  Neurological:     General: No focal deficit present.     Mental Status: She is alert.    CBC    Component Value Date/Time   WBC 7.2 08/20/2022 1508   RBC 4.58 08/20/2022 1508   HGB 13.6 08/20/2022 1508   HGB 12.4 11/24/2019 1129   HCT 40.9 08/20/2022 1508   HCT 36.5 11/24/2019 1129   PLT 193.0 08/20/2022 1508   PLT 175 11/24/2019 1129   MCV 89.3 08/20/2022 1508   MCV 90 11/24/2019 1129   MCH 25.9 (L) 02/06/2020 0554   MCHC 33.2 08/20/2022 1508   RDW 13.4 08/20/2022 1508   RDW  12.4 11/24/2019 1129   LYMPHSABS 1.8 08/20/2022 1508   MONOABS 0.7 08/20/2022 1508   EOSABS 0.2 08/20/2022 1508   BASOSABS 0.0 08/20/2022 1508      Latest Ref Rng & Units 08/20/2022    3:08 PM 06/16/2022    1:50 PM 06/03/2022    1:14 PM  BMP  Glucose 70 - 99 mg/dL 88   96  88   BUN 6 - 23 mg/dL 32  30  21   Creatinine 0.40 - 1.20 mg/dL 0.90  0.88  0.89   BUN/Creat Ratio 12 - 28  34  24   Sodium 135 - 145 mEq/L 142  144  144   Potassium 3.5 - 5.1 mEq/L 4.6  4.5  4.8   Chloride 96 - 112 mEq/L 96  98  99   CO2 19 - 32 mEq/L 33  33  33   Calcium 8.4 - 10.5 mg/dL 10.1  9.3  9.4    Chest imaging: CT Chest 02/13/22 1. Right middle lobe and left upper lobe tree-in-bud nodularity consistent with atypical infection. 2. Interval development of a 6x4 mm subpleural nodule along the left major fissure. No follow-up needed if patient is low-risk.This recommendation follows the consensus statement: Guidelines for Management of Incidental Pulmonary Nodules Detected on CT Images: From the Fleischner Society 2017; Radiology 2017; 284:228-243. 3. Cardiomegaly. 4. Mitral valve replacement.  PFT:    Latest Ref Rng & Units 01/01/2021   11:49 AM 12/01/2019    9:39 AM  PFT Results  FVC-Pre L 1.55  2.13   FVC-Predicted Pre % 51  69   FVC-Post L 1.67    FVC-Predicted Post % 55    Pre FEV1/FVC % % 74  74   Post FEV1/FCV % % 81    FEV1-Pre L 1.15  1.57   FEV1-Predicted Pre % 50  68   FEV1-Post L 1.36    DLCO uncorrected ml/min/mmHg 21.48  14.09   DLCO UNC% % 105  68   DLCO corrected ml/min/mmHg 22.12  14.56   DLCO COR %Predicted % 108  71   DLVA Predicted % 110  105   TLC L 4.50  5.36   TLC % Predicted % 84  100   RV % Predicted % 131  140     Labs:  Path:  Echo 02/11/22: LV EF 50-55%. RV systolic function is moderately reduced. RV size is moderately enlarged. Mildly elevated pulmonary artery systolic pressure [id number]. RA mildly dilated.  Heart Catheterization:  Assessment & Plan:   Bronchiectasis without complication (HCC)  Shortness of breath  Discussion: [name] is a 77 year old woman with history of mitral regurgitation s/p minimally invassive MV repair and MAZE procedure complicated by LV free wall rupture 10/2019 and scoliosis with 7 spinal  surgeries in 2015 who returns to pulmonary clinic for shortness of breath.   Her bronchiectasis is very mild based on CT imaging. This is not the etiology of her shortness of breath. PFTs are within normal limits with positive bronchodilator response. She is to continue yupelri nebulizer treatment daily as she noted improvement in her dyspnea.   She has been referred to pulmonary rehab but they have not been able to connect with the patient to enroll her in the program.   We will hold off on initiating nocturnal oxygen therapy at this time given her epistaxis issues. She is to use saline nasal spray and vaseline per ENT recommendations.  She has history of complete spinal stenosis  which is likely leading to some degree of restriction in her chest wall mobility that can add to her shortness of breath.   Follow up in 4 months.  Freda Jackson, MD [facility] Office: [phone]    Current Outpatient Medications:    acetaminophen (TYLENOL) 500 MG tablet, Take 500 mg by mouth every 6 (six) hours as needed for moderate pain. , Disp: , Rfl:    apixaban (ELIQUIS) 5 MG TABS tablet, Take 1 tablet (5 mg total) by mouth 2 (two) times daily., Disp: 60 tablet, Rfl: 5   b complex vitamins tablet, Take 1 tablet by mouth daily., Disp: , Rfl:    Calcium Carbonate Antacid (TUMS CHEWY BITES PO), Take 1 tablet by mouth daily as needed (reflux). , Disp: , Rfl:    Calcium Citrate-Vitamin D (CALCIUM + D PO), Take 1 tablet by mouth daily., Disp: , Rfl:    Carboxymethylcellul-Glycerin (LUBRICATING EYE DROPS OP), Place 1 drop into both eyes daily as needed (dry eyes)., Disp: , Rfl:    cephALEXin (KEFLEX) 500 MG capsule, Take 2 grams (4 tablets) by mouth 1 hour prior to dental cleaning/procedure., Disp: 4 capsule, Rfl: 2   clonazePAM (KLONOPIN) 0.5 MG tablet, Take 1 tablet by mouth twice daily as needed for anxiety, Disp: 30 tablet, Rfl: 3   denosumab (PROLIA) 60 MG/ML SOSY injection, Inject  60 mg into the skin every 6 (six) months., Disp: , Rfl:    furosemide (LASIX) 40 MG tablet, Take 1 tablet (40 mg total) by mouth daily., Disp: 30 tablet, Rfl: 1   levothyroxine (SYNTHROID) 75 MCG tablet, Take 1 tablet by mouth once daily, Disp: 90 tablet, Rfl: 0   meclizine (ANTIVERT) 25 MG tablet, TAKE 1 TABLET BY MOUTH THREE TIMES DAILY AS NEEDED FOR DIZZINESS, Disp: 30 tablet, Rfl: 0   metoprolol tartrate (LOPRESSOR) 25 MG tablet, Take 0.5 tablets (12.5 mg total) by mouth 2 (two) times daily., Disp: 90 tablet, Rfl: 3   mupirocin ointment (BACTROBAN) 2 %, Apply 1 Application topically daily., Disp: , Rfl:    Nutritional Supplements (,FEEDING SUPPLEMENT, PROSOURCE PLUS) liquid, Take 30 mLs by mouth 2 (two) times daily between meals., Disp: 887 mL, Rfl: 0   nystatin (MYCOSTATIN/NYSTOP) powder, Apply topically 2 (two) times daily as needed (skin irritation (under breasts))., Disp: 15 g, Rfl: 0   ondansetron (ZOFRAN) 4 MG tablet, Take 1 tablet (4 mg total) by mouth every 8 (eight) hours as needed., Disp: 30 tablet, Rfl: 0   oxyCODONE (ROXICODONE) 5 MG/5ML solution, Take 4 mLs (4 mg total) by mouth every 4 (four) hours as needed for moderate pain., Disp: , Rfl: 0   polyethylene glycol (MIRALAX / GLYCOLAX) packet, Take 17 g by mouth daily as needed for mild constipation., Disp: , Rfl:    potassium chloride SA (KLOR-CON M) 20 MEQ tablet, Take 1 tablet (20 mEq total) by mouth daily., Disp: 90 tablet, Rfl: 3   sodium chloride (OCEAN) 0.65 % SOLN nasal spray, Place 1 spray into both nostrils as needed for congestion (nose bleeds)., Disp: , Rfl:    valACYclovir (VALTREX) 500 MG tablet, Take 500 mg by mouth daily as needed (breakouts). , Disp: , Rfl:   -

## 2022-09-24 ENCOUNTER — Ambulatory Visit: Payer: PRIVATE HEALTH INSURANCE | Admitting: Cardiology

## 2022-09-24 ENCOUNTER — Ambulatory Visit: Payer: Medicare Other | Admitting: Cardiology

## 2022-09-25 ENCOUNTER — Ambulatory Visit (HOSPITAL_COMMUNITY): Payer: Medicare Other

## 2022-09-26 DIAGNOSIS — I499 Cardiac arrhythmia, unspecified: Secondary | ICD-10-CM | POA: Diagnosis not present

## 2022-09-26 DIAGNOSIS — I10 Essential (primary) hypertension: Secondary | ICD-10-CM | POA: Diagnosis not present

## 2022-09-29 ENCOUNTER — Telehealth: Payer: Self-pay | Admitting: Cardiology

## 2022-09-29 NOTE — Telephone Encounter (Signed)
Spoke to patient she stated she has been having swelling in both ankles,left is worse.No sob.Stated she has been eating salty foods.She will follow low salt diet.She is unable to wear compression hose,too hard to put on.Advised ok to double Lasix dose for the next 3 days then return to normal dose.I will make Dr.Crenshaw aware.

## 2022-09-29 NOTE — Telephone Encounter (Signed)
  Pt c/o swelling: STAT is pt has developed SOB within 24 hours  If swelling, where is the swelling located? Both ankles  How much weight have you gained and in what time span? None   Have you gained 3 pounds in a day or 5 pounds in a week? No   Do you have a log of your daily weights (if so, list)? None   Are you currently taking a fluid pill? Yes   Are you currently SOB? Per pt she's been always SOB  Have you traveled recently? No    Pt is said, her ankles is still swelling and its not going down even if she is taking her fluid pill

## 2022-09-30 ENCOUNTER — Ambulatory Visit (HOSPITAL_COMMUNITY): Payer: Medicare Other

## 2022-10-01 ENCOUNTER — Ambulatory Visit: Payer: PRIVATE HEALTH INSURANCE | Admitting: Pharmacist

## 2022-10-01 NOTE — Progress Notes (Signed)
Care Management & Coordination Services Pharmacy Note  10/03/2022 Name:  [name] MRN:  [id number] DOB:  1946-06-13  Summary: PharmD FU visit.  She is having some shortness of breath and fluid.  Reports that the lasix does help, however, SOB and fatigue still present.  We discussed GDMT for heart failure, specifically SGLT-2 because I think she would be excellent candidate.  She politely declines at this time.  Recommendations/Changes made from today's visit: Re-consider SGLT-2 for patient with HF  Follow up plan: FU 3 months   Subjective: [name] is an 77 y.o. year old female who is a primary patient of Tabori, [name], MD.  The care coordination team was consulted for assistance with disease management and care coordination needs.    Engaged with patient by telephone for follow up visit.  Recent office visits:  05/02/22 - [name], MD - Family Medicine - Hypothyroidism - Labs were ordered. No medication changes. Follow up in 6 months.    Recent consult visits:  09/23/22 (Pulmonology) - Continue Maretta Bees, use nasal clip when doing nebs.  06/09/22 Nicholes Rough, PA-C - Cardiology - Mitral Valve Repair - Labs were ordered. EKG ordered. Follow up as scheduled.    Hospital visits: 03/08/22 Medication Reconciliation was completed by comparing discharge summary, patient's EMR and Pharmacy list, and upon discussion with patient.   Admitted to the hospital on 03/08/22 due to Vertigo. Discharge date was 03/08/22. Discharged from [facility] .     New?Medications Started at [facility] Discharge:?? ondansetron (ZOFRAN-ODT) 4 mg disintegrating tablet   meclizine HCl (ANTIVERT) 25 mg tablet     Medication Changes at Hospital Discharge: None noted.    Medications Discontinued at Hospital Discharge: None noted.    Medications that remain the same after Hospital Discharge:??  All other medications will remain the same.     Objective:  Lab Results  Component  Value Date   CREATININE 0.90 08/20/2022   BUN 32 (H) 08/20/2022   GFR 61.43 08/20/2022   EGFR 68 06/16/2022   GFRNONAA >60 02/06/2020   GFRAA >60 02/06/2020   NA 142 08/20/2022   K 4.6 08/20/2022   CALCIUM 10.1 08/20/2022   CO2 33 (H) 08/20/2022   GLUCOSE 88 08/20/2022    Lab Results  Component Value Date/Time   HGBA1C 5.6 04/16/2020 04:09 PM   HGBA1C 5.3 01/13/2020 12:00 PM   GFR 61.43 08/20/2022 03:08 PM   GFR 75.67 10/31/2021 02:36 PM    Last diabetic Eye exam: No results found for: "HMDIABEYEEXA"  Last diabetic Foot exam: No results found for: "HMDIABFOOTEX"   Lab Results  Component Value Date   CHOL 175 12/10/2020   HDL 80.40 12/10/2020   LDLCALC 79 12/10/2020   LDLDIRECT 136.5 06/08/2013   TRIG 79.0 12/10/2020   CHOLHDL 2 12/10/2020       Latest Ref Rng & Units 10/31/2021    2:36 PM 12/10/2020    1:38 PM 07/11/2020   10:03 AM  Hepatic Function  Total Protein 6.0 - 8.3 g/dL 7.3  6.9  7.4   Albumin 3.5 - 5.2 g/dL 4.5  4.2  4.3   AST 0 - 37 U/L 25  22  30    ALT 0 - 35 U/L 22  17  24    Alk Phosphatase 39 - 117 U/L 70  58  61   Total Bilirubin 0.2 - 1.2 mg/dL 0.5  0.4  0.4   Bilirubin, Direct 0.0 - 0.3 mg/dL 0.1  0.1  0.1     Lab Results  Component Value Date/Time   TSH 0.91 08/20/2022 03:08 PM   TSH 0.46 05/02/2022 01:51 PM   FREET4 1.36 05/02/2022 01:51 PM   FREET4 1.58 04/17/2021 12:27 PM       Latest Ref Rng & Units 08/20/2022    3:08 PM 10/31/2021    2:36 PM 08/28/2021   10:56 AM  CBC  WBC 4.0 - 10.5 K/uL 7.2  6.6  4.7   Hemoglobin 12.0 - 15.0 g/dL 13.6  13.4  12.5   Hematocrit 36.0 - 46.0 % 40.9  40.6  38.0   Platelets 150.0 - 400.0 K/uL 193.0  187.0  180.0     Lab Results  Component Value Date/Time   VD25OH 27.58 (L) 09/23/2017 02:20 PM   VD25OH 46 10/26/2015 11:40 AM   VD25OH 27 (L) 07/25/2015 12:31 PM   [id number] 04/16/2020 04:09 PM   VITAMINB12 218 02/18/2017 02:56 PM    Clinical ASCVD: No  The 10-year ASCVD risk score (Arnett DK,  et al., 2019) is: 32.8%   Values used to calculate the score:     Age: 29 years     Sex: Female     Is Non-Hispanic African American: No     Diabetic: No     Tobacco smoker: No     Systolic Blood Pressure: [id number] mmHg     Is BP treated: Yes     HDL Cholesterol: 80.4 mg/dL     Total Cholesterol: 175 mg/dL        08/20/2022    2:18 PM 07/14/2022    2:08 PM 05/02/2022    1:10 PM  Depression screen PHQ 2/9  Decreased Interest 1 2 1   Down, Depressed, Hopeless 1 1 0  PHQ - 2 Score 2 3 1   Altered sleeping 1 1 0  Tired, decreased energy 1 2 1   Change in appetite 1 2 0  Feeling bad or failure about yourself  1 0 0  Trouble concentrating 1 1 0  Moving slowly or fidgety/restless 1 0 0  Suicidal thoughts 0 0   PHQ-9 Score 8 9 2   Difficult doing work/chores Very difficult Somewhat difficult Not difficult at all     Social History   Tobacco Use  Smoking Status Former   Packs/day: 0.10   Years: 14.00   Additional pack years: 0.00   Total pack years: 1.40   Types: Cigarettes   Quit date: 1980   Years since quitting: 44.2  Smokeless Tobacco Never  Tobacco Comments   03/31/2018 "quit ~ 1980; someday smoker when I did smoke; never addicted"   BP Readings from Last 3 Encounters:  09/23/22 136/72  09/11/22 (!) 96/58  08/20/22 116/80   Pulse Readings from Last 3 Encounters:  09/23/22 71  09/11/22 75  08/20/22 74   Wt Readings from Last 3 Encounters:  09/23/22 114 lb (51.7 kg)  09/11/22 113 lb 9.6 oz (51.5 kg)  08/20/22 112 lb (50.8 kg)   BMI Readings from Last 3 Encounters:  09/23/22 18.40 kg/m  09/11/22 18.34 kg/m  08/20/22 18.08 kg/m    Allergies  Allergen Reactions   Buprenorphine Nausea Only and Other (See Comments)    sedation and adhesive reaction Other reaction(s): nausea, sedation and adhesive reaction Other reaction(s): nausea, sedation and adhesive reaction, Other (See Comments), Other (See Comments) sedation and adhesive reaction Other reaction(s): nausea,  sedation and adhesive reaction sedation and adhesive reaction Other reaction(s): nausea, sedation and adhesive reaction sedation and  adhesive reaction Other reaction(s): nausea, sedation and adhesive reaction sedation and adhesive reaction Other reaction(s): nausea, sedation and adhesive reaction sedation and adhesive reaction Other reaction(s): nausea, sedation and adhesive reaction sedation and adhesive reaction Other reaction(s): nausea, sedation and adhesive reaction   Erythromycin Nausea And Vomiting and Other (See Comments)    Dizziness  Other reaction(s): Other (See Comments) Dizziness  unknown unknown Dizziness  BURNING ALL OVER  BURNING ALL OVER  unknown Dizziness  BURNING ALL OVER    Iodinated Contrast Media Hives, Nausea Only and Rash    unknown unknown unknown   Latex Palpitations, Other (See Comments) and Rash     hives and sores Other reaction(s): hives and sores Other reaction(s):  hives and sores, Other (See Comments), Other (See Comments)  hives and sores Other reaction(s): hives and sores hives hives  hives and sores Other reaction(s): hives and sores hives Sores  hives and sores Other reaction(s): hives and sores hives Sores  hives and sores Other reaction(s): hives and sores hives  hives and sores Other reaction(s): hives and sores hives Sores  hives and sores Other reaction(s): hives and sores   Other Other (See Comments)    CAN NOT TAKE , aLPRAZOLAM, OR ELAVIL DUE TO AFIB FOR ANXIETY  IV CONTRAST /"DYE". Other reaction(s): hives and sores Other reaction(s): Other (See Comments), Other (See Comments) CAN NOT TAKE , aLPRAZOLAM, OR ELAVIL DUE TO AFIB FOR ANXIETY  IV CONTRAST /"DYE". Other reaction(s): hives and sores IV CONTRAST /"DYE". CAN NOT TAKE , aLPRAZOLAM, OR ELAVIL DUE TO AFIB FOR ANXIETY  IV CONTRAST /"DYE". Other reaction(s): hives and sores Patient states that every time she has anesthesia, she wakes up vomiting, has  chills, blood pressure drops CAN NOT TAKE , aLPRAZOLAM, OR ELAVIL DUE TO AFIB FOR ANXIETY  IV CONTRAST /"DYE". Other reaction(s): hives and sores IV CONTRAST /"DYE". Patient states that every time she has anesthesia, she wakes up vomiting, has chills, blood pressure drops CAN NOT TAKE , aLPRAZOLAM, OR ELAVIL DUE TO AFIB FOR ANXIETY  IV CONTRAST /"DYE". Other reaction(s): hives and sores IV CONTRAST /"DYE". CAN NOT TAKE , aLPRAZOLAM, OR ELAVIL DUE TO AFIB FOR ANXIETY  IV CONTRAST /"DYE". Other reaction(s): hives and sores Patient states that every time she has anesthesia, she wakes up vomiting, has chills, blood pressure drops CAN NOT TAKE , aLPRAZOLAM, OR ELAVIL DUE TO AFIB FOR ANXIETY  IV CONTRAST /"DYE". Other reaction(s): hives and sores   Tape Rash, Other (See Comments) and Itching    Heart monitor stickers must be rotated in order to prevent rash Other reaction(s): hives and sores Adhesive on EKG tabs=BURNS SKIN**PAPER TAPE OK** sores Adhesive on EKG tabs. Per Pt, Ok w/Paper Tape Adhesive on EKG tabs=BURNS SKIN**PAPER TAPE OK** sores Other reaction(s): Other (See Comments) Heart monitor stickers must be rotated in order to prevent rash Adhesive on EKG tabs=BURNS SKIN**PAPER TAPE OK** sores   Ciprofloxacin Other (See Comments)    Other reaction(s): Other (See Comments) Burning all over Burning all over Burning all over   Duloxetine Hcl     Severe diarrhea and upset stomach Other reaction(s): Other (See Comments), Unknown Severe diarrhea and upset stomach Severe diarrhea and upset stomach Severe diarrhea and upset stomach   Erythromycin Base     Other reaction(s): Other (See Comments), Unknown   Hydrocodone Other (See Comments)    Sedation,dizziness, and nausea Other reaction(s): sedation and nausea Other reaction(s): sedation and nausea Other reaction(s): Other (See Comments), Other (See Comments), sedation and  nausea Sedation,dizziness, and nausea Other  reaction(s): sedation and nausea Other reaction(s): sedation and nausea Sedation,dizziness, and nausea Other reaction(s): sedation and nausea Other reaction(s): sedation and nausea Sedation,dizziness, and nausea Other reaction(s): sedation and nausea Other reaction(s): sedation and nausea Sedation,dizziness, and nausea Other reaction(s): sedation and nausea Other reaction(s): sedation and nausea Sedation,dizziness, and nausea Other reaction(s): sedation and nausea Other reaction(s): sedation and nausea Sedation,dizziness, and nausea Other reaction(s): sedation and nausea Other reaction(s): sedation and nausea   Hydromorphone Other (See Comments)    Sedation,dizziness, and nausea Other reaction(s): sedation and nausea Other reaction(s): sedation and nausea Other reaction(s): Other (See Comments), Other (See Comments), sedation and nausea Sedation,dizziness, and nausea Other reaction(s): sedation and nausea Other reaction(s): sedation and nausea Sedation,dizziness, and nausea Other reaction(s): sedation and nausea Other reaction(s): sedation and nausea Sedation,dizziness, and nausea Other reaction(s): sedation and nausea Other reaction(s): sedation and nausea Sedation,dizziness, and nausea Other reaction(s): sedation and nausea Other reaction(s): sedation and nausea Sedation,dizziness, and nausea Other reaction(s): sedation and nausea Other reaction(s): sedation and nausea Sedation,dizziness, and nausea Other reaction(s): sedation and nausea Other reaction(s): sedation and nausea   Iodine     unknown Other reaction(s): Other (See Comments) unknown unknown unknown unknown unknown unknown   Levofloxacin Nausea And Vomiting and Nausea Only   Metrizamide Hives   Nsaids     CANNOT TAKE PER CARDIOLOGIST DUE TO AFIB    Nucynta [Tapentadol Hcl]     nausea and sedation   Oxycodone Other (See Comments)    Delusions (intolerance)  PILLS ONLY sedation, dizziness, nausea,   and itching Other reaction(s): sedation and nausea Other reaction(s): sedation and nausea Other reaction(s): Hallucinations, Other Other reaction(s): Other (See Comments), sedation and nausea Delusions (intolerance)  PILLS ONLY sedation, dizziness, nausea,  and itching Other reaction(s): sedation and nausea Other reaction(s): sedation and nausea Delusions (intolerance)  PILLS ONLY sedation, dizziness, nausea,  and itching Other reaction(s): sedation and nausea Other reaction(s): sedation and nausea Delusions (intolerance)  PILLS ONLY sedation, dizziness, nausea,  and itching Other reaction(s): sedation and nausea Other reaction(s): sedation and nausea Delusions (intolerance)  PILLS ONLY sedation, dizziness, nausea,  and itching Other reaction(s): sedation and nausea Other reaction(s): sedation and nausea Delusions (intolerance)  PILLS ONLY sedation, dizziness, nausea,  and itching Other reaction(s): sedation and nausea Other reaction(s): sedation and nausea Delusions (intolerance)  PILLS ONLY sedation, dizziness, nausea,  and itching Other reaction(s): sedation and nausea Other reaction(s): sedation and nausea   Pentazocine Other (See Comments)    Unknown Other reaction(s): Unknown Other reaction(s): Unknown Other reaction(s): Other (See Comments), Other (See Comments), Unknown Unknown Other reaction(s): Unknown Other reaction(s): Unknown Unknown Other reaction(s): Unknown Other reaction(s): Unknown Unknown Other reaction(s): Unknown Other reaction(s): Unknown Unknown Other reaction(s): Unknown Other reaction(s): Unknown Unknown Other reaction(s): Unknown Other reaction(s): Unknown Unknown Other reaction(s): Unknown Other reaction(s): Unknown   Septra [Bactrim]     Hives    Sulfasalazine Hives   Morphine Anxiety, Other (See Comments) and Nausea Only    sedation and nausea Other reaction(s): sedation and nausea Other reaction(s): sedation and nausea Other  reaction(s): Delusions (intolerance), Other (See Comments), Other (See Comments), sedation and nausea sedation and nausea Other reaction(s): sedation and nausea Other reaction(s): sedation and nausea sedation and nausea Other reaction(s): sedation and nausea Other reaction(s): sedation and nausea PT CANNOT WAKE UP AFTER TAKING MEDICATION AND HAS NIGHTMARES  sedation and nausea Other reaction(s): sedation and nausea Other reaction(s): sedation and nausea PT CANNOT WAKE UP AFTER TAKING MEDICATION AND HAS NIGHTMARES  sedation and nausea Other reaction(s): sedation and nausea Other reaction(s): sedation and nausea sedation and nausea Other reaction(s): sedation and nausea Other reaction(s): sedation and nausea PT CANNOT WAKE UP AFTER TAKING MEDICATION AND HAS NIGHTMARES  sedation and nausea Other reaction(s): sedation and nausea Other reaction(s): sedation and nausea   Sulfa Antibiotics Hives, Nausea Only and Rash    rash rash rash   Sulfamethoxazole-Trimethoprim Rash    Hives  Hives  Hives  Hives  Hives  Hives    Tapentadol Nausea Only    Other reaction(s): nausea and sedation, Other (See Comments) nausea and sedation nausea and sedation    Medications Reviewed Today     Reviewed by [name], [facility] (Pharmacist) on 10/03/22 at 1011  Med List Status: <None>   Medication Order Taking? Sig Documenting Provider Last Dose Status Informant  acetaminophen (TYLENOL) 500 MG tablet [id number] No Take 500 mg by mouth every 6 (six) hours as needed for moderate pain.  [provider] Taking Active Self  apixaban (ELIQUIS) 5 MG TABS tablet [id number] No Take 1 tablet (5 mg total) by mouth 2 (two) times daily. Lelon Perla, MD Taking Active   b complex vitamins tablet [id number] No Take 1 tablet by mouth daily. [provider] Taking Active Self  Calcium Carbonate Antacid (TUMS CHEWY BITES PO) [id number] No Take 1 tablet by mouth daily as needed (reflux).   [provider] Taking Active Self  Calcium Citrate-Vitamin D (CALCIUM + D PO) [id number] No Take 1 tablet by mouth daily. [provider] Taking Active   Carboxymethylcellul-Glycerin (LUBRICATING EYE DROPS OP) [id number] No Place 1 drop into both eyes daily as needed (dry eyes). [provider] Taking Active Self  cephALEXin (KEFLEX) 500 MG capsule [id number] No Take 2 grams (4 tablets) by mouth 1 hour prior to dental cleaning/procedure. Freada Bergeron, MD Taking Active   clonazePAM Bobbye [place]) 0.5 MG tablet [id number] No Take 1 tablet by mouth twice daily as needed for anxiety [name], MD Taking Active   denosumab (PROLIA) 60 MG/ML SOSY injection [id number] No Inject 60 mg into the skin every 6 (six) months. [provider] Taking Active Self           Med Note Shari Heritage Jun 11, 2020  2:33 PM)    furosemide (LASIX) 40 MG tablet [id number] No Take 1 tablet (40 mg total) by mouth daily. Loel Dubonnet, NP Taking Active   levothyroxine (SYNTHROID) 75 MCG tablet [id number] No Take 1 tablet by mouth once daily [name], MD Taking Active   meclizine (ANTIVERT) 25 MG tablet [id number] No TAKE 1 TABLET BY MOUTH THREE TIMES DAILY AS NEEDED FOR DIZZINESS Raylene Everts, MD Taking Active   metoprolol tartrate (LOPRESSOR) 25 MG tablet [id number] No Take 0.5 tablets (12.5 mg total) by mouth 2 (two) times daily. Lelon Perla, MD Taking Active   mupirocin ointment (BACTROBAN) 2 % [id number] No Apply 1 Application topically daily. [provider] Taking Active   Nutritional Supplements (,FEEDING SUPPLEMENT, PROSOURCE PLUS) liquid [id number] No Take 30 mLs by mouth 2 (two) times daily between meals. Bary Leriche, PA-C Taking Active   nystatin (MYCOSTATIN/NYSTOP) powder [id number] No Apply topically 2 (two) times daily as needed (skin irritation (under breasts)). Bary Leriche, PA-C Taking Active   ondansetron (ZOFRAN) 4 MG  tablet [id number] No Take 1 tablet (4 mg total) by mouth every 8 (eight) hours as needed. Raylene Everts, MD Taking  Active   oxyCODONE (ROXICODONE) 5 MG/5ML solution [id number] No Take 4 mLs (4 mg total) by mouth every 4 (four) hours as needed for moderate pain. Bary Leriche, PA-C Taking Active            Med Note Shari Heritage Jun 11, 2020  2:33 PM)    polyethylene glycol [facility] / GLYCOLAX) packet [id number] No Take 17 g by mouth daily as needed for mild constipation. [provider] Taking Active Self  potassium chloride SA (KLOR-CON M) 20 MEQ tablet [id number] No Take 1 tablet (20 mEq total) by mouth daily. Freada Bergeron, MD Taking Active   sodium chloride (OCEAN) 0.65 % SOLN nasal spray [id number] No Place 1 spray into both nostrils as needed for congestion (nose bleeds). [provider] Taking Active Self  valACYclovir (VALTREX) 500 MG tablet [id number] No Take 500 mg by mouth daily as needed (breakouts).  [provider] Taking Active Self            SDOH:  (Social Determinants of Health) assessments and interventions performed: No Financial Resource Strain: Low Risk  (10/10/2021)   Overall Financial Resource Strain (CARDIA)    Difficulty of Paying Living Expenses: Not hard at all   Food Insecurity: No Food Insecurity (10/10/2021)   Hunger Vital Sign    Worried About Running Out of Food in the Last Year: Never true    Ran Out of Food in the Last Year: Never true    SDOH Interventions    [place] Office Visit from 10/31/2021 in [place] at [facility] from 10/10/2021 in [place] at [facility] Visit from 05/06/2021 in [place] at [facility] Visit from 01/15/2021 in [place] at [facility] Patient Outreach Telephone from 08/09/2020 in [place] Patient  Outreach Telephone from 07/02/2020 in [place] Interventions        Food Insecurity Interventions -- Intervention Not Indicated -- -- Intervention Not Indicated Intervention Not Indicated  Housing Interventions -- Intervention Not Indicated -- -- Intervention Not Indicated Intervention Not Indicated  Transportation Interventions -- Intervention Not Indicated -- -- Intervention Not Indicated Intervention Not Indicated  Depression Interventions/Treatment  PHQ2-9 Score <4 Follow-up Not Indicated -- PHQ2-9 Score <4 Follow-up Not Indicated Medication -- --  Financial Strain Interventions -- Intervention Not Indicated -- -- -- --  Physical Activity Interventions -- Intervention Not Indicated -- -- -- --  Stress Interventions -- Intervention Not Indicated -- -- -- Intervention Not Indicated  Social Connections Interventions -- Intervention Not Indicated -- -- -- --       Medication Assistance: None required.  Patient affirms current coverage meets needs.  Medication Access: Within the past 30 days, how often has patient missed a dose of medication? 0 Is a pillbox or other method used to improve adherence? Yes  Factors that may affect medication adherence? adverse effects of medications Are meds synced by current pharmacy? No  Are meds delivered by current pharmacy?  No Does patient experience delays in picking up medications due to transportation concerns? No   [facility] Reviewed: Is patient disadvantaged to use [facility]?: Yes  Current Rx insurance plan: [facility] Name and location of Current pharmacy:  [facility] [address], [place] [address] Phone: [phone] Fax: [phone]  [facility] -  [place], [place] - [id number] [place] [address] [place] [address] Phone: [phone] Fax:  [phone]  [facility] services reviewed with patient today?: Yes  Patient requests to transfer care to [facility]?: No  Reason patient declined to change pharmacies: Disadvantaged due to insurance/mail order  Compliance/Adherence/Medication fill history: Care Gaps: Due for AWV  Star-Rating Drugs: NONE   Assessment/Plan        Hypertension (BP goal <130/80) 10/01/22 -Controlled, based on office BP -Current treatment: Metoprolol tartrate 12.5mg  twice daily Appropriate, Effective, Safe, Accessible -Medications previously tried: none noted  -Current home readings: no logs discussed, normal for the most part -Current dietary habits: same see previous -Current exercise habits: same see previous -Denies hypotensive/hypertensive symptoms -Educated on BP goals and benefits of medications for prevention of heart attack, stroke and kidney damage; -Patient reports no adverse effects from meds at this time, BP seems to be better controlled.  She declines additional therapy for GDMT with HF - will continue to educate and pursue.  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%) -NYHA Class: II (slight limitation of activity) -AHA HF Stage: C (Heart disease and symptoms present) -Current treatment: Furosemide 40mg  Appropriate, Effective, Safe, Accessible Metoprolol tartrate 12.5mg  bid Appropriate, Effective, Safe, Accessible -Medications previously tried: none noted  -Current home BP/HR readings: controlled at office visits, no home logs -Current home daily weights: she is not checking weights at home -Current dietary habits: watching her salt intake to avoid excess fluids -Current exercise habits:  -Educated on Benefits of medications for managing symptoms and prolonging life Importance of weighing daily; if you gain more than 3 pounds in one day or 5 pounds in  one week, contact providers Proper diuretic administration and potassium supplementation - We discussed SGLT-2 and spironolactone.  She politely declines and due to not wanting additional medication.  Discussed GDMT and evidence behind decreased hospitalizations - still does not with to start therapy.  I believe she would benefit from SGLT-2 especially to help with fluid and SOB. Continue to check in and suggest GDMT.  Atrial Fibrillation (Goal: prevent stroke and major bleeding) -Controlled -CHADSVASC: 2+ -Current treatment: Rate control: Metoprolol tartrate 12.5mg  bid Appropriate, Effective, Safe, Accessible Anticoagulation: Eliquis 5mg  bid Appropriate, Effective, Safe, Accessible -Medications previously tried: none noted -Home BP and HR readings: controlled in office, borderline low.  No home logs discussed  -Counseled on increased risk of stroke due to Afib and benefits of anticoagulation for stroke prevention; Afib action plan reviewed -Recommended to continue current medication Eliquis dose still appropriate for age, weight, kidney function.  Lung Nodule/SOB (Goal: Improve symptoms) -Query controlled -Current treatment  Stiolto Respimat 2 puffs daily Appropriate, Effective, Safe, Accessible -Medications previously tried: none noted -Recently diagnosed with bronchiectasis through pulmonology.  At this point her next follow up with them is in November.  Encouraged her to reach out to them if she feels she needs to follow up sooner.   -Recommended to continue current medication At this time continue inhalers as prescribed.  No changes at this time.  Please reach out if you need anything!    Patient Goals/Self-Care Activities Patient will:  - take medications as prescribed as evidenced by patient report and record review  Follow Up Plan: The care management team will reach out to the patient again over the next 180 days.       Beverly Milch, PharmD  Clinical Pharmacist   [employer] [phone]

## 2022-10-02 ENCOUNTER — Ambulatory Visit (HOSPITAL_COMMUNITY): Payer: Medicare Other

## 2022-10-03 ENCOUNTER — Encounter: Payer: Self-pay | Admitting: Adult Health

## 2022-10-03 ENCOUNTER — Ambulatory Visit: Payer: Medicare Other | Attending: Adult Health | Admitting: Adult Health

## 2022-10-03 VITALS — BP 118/72 | HR 67 | Ht 66.0 in | Wt 112.2 lb

## 2022-10-03 DIAGNOSIS — I480 Paroxysmal atrial fibrillation: Secondary | ICD-10-CM

## 2022-10-03 DIAGNOSIS — E038 Other specified hypothyroidism: Secondary | ICD-10-CM | POA: Insufficient documentation

## 2022-10-03 DIAGNOSIS — I10 Essential (primary) hypertension: Secondary | ICD-10-CM | POA: Diagnosis not present

## 2022-10-03 DIAGNOSIS — I5032 Chronic diastolic (congestive) heart failure: Secondary | ICD-10-CM

## 2022-10-03 DIAGNOSIS — Z9889 Other specified postprocedural states: Secondary | ICD-10-CM | POA: Insufficient documentation

## 2022-10-03 DIAGNOSIS — Z79899 Other long term (current) drug therapy: Secondary | ICD-10-CM | POA: Insufficient documentation

## 2022-10-03 MED ORDER — FUROSEMIDE 40 MG PO TABS: ORAL_TABLET | ORAL | 3 refills | Status: DC

## 2022-10-03 MED ORDER — FUROSEMIDE 40 MG PO TABS: 60.0000 mg | ORAL_TABLET | ORAL | 3 refills | Status: DC

## 2022-10-03 NOTE — Telephone Encounter (Signed)
Patient states she is returning RN's call for 3-day follow-up.

## 2022-10-03 NOTE — Patient Instructions (Signed)
Medication Instructions:   Start Lasix (Furosemide) 40 MG every Monday, Wednesday, Friday, and Sunday  Start Lasix (Furosemide) 60 MG every Tuesday, Thursday, and Saturday  *If you need a refill on your cardiac medications before your next appointment, please call your pharmacy*   Lab Work: Your physician recommends that you lab work today:   CMET TSH CBC Hemoglobin A1C   If you have labs (blood work) drawn today and your tests are completely normal, you will receive your results only by: [place] (if you have MyChart) OR A paper copy in the mail If you have any lab test that is abnormal or we need to change your treatment, we will call you to review the results.   Testing/Procedures: None ordered   Follow-Up: At [facility], you and your health needs are our priority.  As part of our continuing mission to provide you with exceptional heart care, we have created designated Provider Care Teams.  These Care Teams include your primary Cardiologist (physician) and Advanced Practice Providers (APPs -  Physician Assistants and Nurse Practitioners) who all work together to provide you with the care you need, when you need it.  We recommend signing up for the patient portal called "MyChart".  Sign up information is provided on this After Visit Summary.  MyChart is used to connect with patients for Virtual Visits (Telemedicine).  Patients are able to view lab/test results, encounter notes, upcoming appointments, etc.  Non-urgent messages can be sent to your provider as well.   To learn more about what you can do with MyChart, go to [email or web address].    Your next appointment:   1 month(s)  Provider:   Kirk Ruths, MD  or  Any APP

## 2022-10-03 NOTE — Progress Notes (Signed)
Patient has chronic dyspnea on exertion, inability to exercise without complete exhaustion.

## 2022-10-03 NOTE — Progress Notes (Signed)
Cardiology Clinic Note   Patient Name: [name] Date of Encounter: 10/03/2022  Primary Care Provider:  [name], MD Primary Cardiologist:  Kirk Ruths, MD  Patient Profile    77 year old female with known history of chronic dyspnea, previous MV repair, PAF.  Status post minimally invasive MV repair with complete Maze procedure and sternotomy with repair of the left ventricular free wall laceration on July 2021.  History of coronary artery disease revealing 20% right coronary artery and 20% distal LAD in May 2021.    Echocardiogram August 2023 revealed normal LV function, flattened septum in systole consistent with RV pressure overload, moderate RV dysfunction and mild right ventricular enlargement with mild right atrial enlargement status post mitral valve repair with no mitral valve stenosis with trace mitral regurg and mild aortic insufficiency.  A follow-up chest CT in August 2023 revealed right middle lobe and left upper lobe nodularity suggestive of atypical infection nodule in the left major fissure.  Past Medical History    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[place]) ~ 2003   "right"   Mitral regurgitation    Osteopenia 02/18/2012   Osteoporosis    PAF (paroxysmal atrial fibrillation) ([place]) 2012   Peripheral neuropathy 11/06/2011   PMDD (premenstrual dysphoric disorder)    PONV (postoperative nausea and vomiting)    nausea, vomiting, hives and dizziness    S/P  Maze operation for atrial fibrillation 01/17/2020   Complete bilateral atrial lesion set using cryothermy and bipolar radiofrequency ablation with clipping of LA appendage via right mini-thoracotomy approach   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   Complex valvuloplasty including artificial Gore-tex neochord placement x12 with 34mm Sorin Memo 4D ring annuloplasty   Seasonal allergies    Vaginal delivery    ONE NSVD   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   Past Surgical History:  Procedure Laterality Date   ANTERIOR CERVICAL DECOMP/DISCECTOMY FUSION  ~ 2003   BACK SURGERY     BREAST SURGERY     BREAST BIOPSY--RIGHT BENIGN   BUNIONECTOMY Bilateral    CARDIOVERSION N/A 01/26/2020   Procedure: CARDIOVERSION;  Surgeon: Geralynn Rile, MD;  Location: [place];  Service: Cardiovascular;  Laterality: N/A;   COSMETIC SURGERY  2016   "back of my neck; related to earlier fusion"   [place]  "several times"   [place] Right    "removed bone protruding out of my forehead"   HARDWARE REMOVAL  2016   "related to neck OR"   INCONTINENCE SURGERY     IR THORACENTESIS ASP PLEURAL SPACE W/IMG GUIDE  02/16/2020   MINIMALLY INVASIVE MAZE PROCEDURE N/A 01/17/2020   Procedure: MINIMALLY INVASIVE MAZE PROCEDURE;  Surgeon: Rexene Alberts, MD;  Location: [place];  Service: Open Heart Surgery;  Laterality: N/A;   MITRAL VALVE REPAIR Right 01/17/2020   Procedure: MINIMALLY INVASIVE  MITRAL VALVE REPAIR (MVR) USING MEMO 4D 32MM;  Surgeon: Rexene Alberts, MD;  Location: [place];  Service: Open Heart Surgery;  Laterality: Right;   POSTERIOR CERVICAL FUSION/FORAMINOTOMY  ~ 2008; 2015   RIGHT/LEFT HEART CATH AND CORONARY ANGIOGRAPHY N/A 12/02/2019   Procedure: RIGHT/LEFT HEART CATH AND CORONARY ANGIOGRAPHY;  Surgeon: Burnell Blanks, MD;  Location: [facility];  Service: Cardiovascular;  Laterality: N/A;   SHOULDER ARTHROSCOPY W/ ROTATOR  CUFF REPAIR Right 2012   SPINAL FUSION  06/2014 - 2018 X ?7   "scoliosis; my entire back"   TEE WITHOUT CARDIOVERSION N/A 11/21/2019   Procedure: TRANSESOPHAGEAL ECHOCARDIOGRAM (TEE);  Surgeon: Josue Hector, MD;  Location: [facility];  Service: Cardiovascular;  Laterality: N/A;   TEE WITHOUT CARDIOVERSION N/A 01/17/2020   Procedure: TRANSESOPHAGEAL ECHOCARDIOGRAM (TEE);  Surgeon: Rexene Alberts, MD;  Location: [place];  Service: Open Heart Surgery;  Laterality: N/A;   TEE WITHOUT CARDIOVERSION N/A 01/26/2020   Procedure: TRANSESOPHAGEAL ECHOCARDIOGRAM (TEE);  Surgeon: Geralynn Rile, MD;  Location: [place];  Service: Cardiovascular;  Laterality: N/A;   TUBAL LIGATION     VAGINAL HYSTERECTOMY     TVH    Allergies  Allergies  Allergen Reactions   Buprenorphine Nausea Only and Other (See Comments)    sedation and adhesive reaction Other reaction(s): nausea, sedation and adhesive reaction Other reaction(s): nausea, sedation and adhesive reaction, Other (See Comments), Other (See Comments) sedation and adhesive reaction Other reaction(s): nausea, sedation and adhesive reaction sedation and adhesive reaction Other reaction(s): nausea, sedation and adhesive reaction sedation and adhesive reaction Other reaction(s): nausea, sedation and adhesive reaction sedation and adhesive reaction Other reaction(s): nausea, sedation and adhesive reaction sedation and adhesive reaction Other reaction(s): nausea, sedation and adhesive reaction sedation and adhesive reaction Other reaction(s): nausea, sedation and adhesive reaction   Erythromycin Nausea And Vomiting and Other (See Comments)    Dizziness  Other reaction(s): Other (See Comments) Dizziness  unknown unknown Dizziness  BURNING ALL OVER  BURNING ALL OVER  unknown Dizziness  BURNING ALL OVER    Iodinated Contrast Media Hives, Nausea Only and Rash    unknown unknown unknown   Latex Palpitations, Other (See Comments)  and Rash     hives and sores Other reaction(s): hives and sores Other reaction(s):  hives and sores, Other (See Comments), Other (See Comments)  hives and sores Other reaction(s): hives and sores hives hives  hives and sores Other reaction(s): hives and sores hives Sores  hives and sores Other reaction(s): hives and sores hives Sores  hives and sores Other reaction(s): hives and sores hives  hives and sores Other reaction(s): hives and sores hives Sores  hives and sores Other reaction(s): hives and sores   Other Other (See Comments)    CAN NOT TAKE , aLPRAZOLAM, OR ELAVIL DUE TO AFIB FOR ANXIETY  IV CONTRAST /"DYE". Other reaction(s): hives and sores Other reaction(s): Other (See Comments), Other (See Comments) CAN NOT TAKE , aLPRAZOLAM, OR ELAVIL DUE TO AFIB FOR ANXIETY  IV CONTRAST /"DYE". Other reaction(s): hives and sores IV CONTRAST /"DYE". CAN NOT TAKE , aLPRAZOLAM, OR ELAVIL DUE TO AFIB FOR ANXIETY  IV CONTRAST /"DYE". Other reaction(s): hives and sores Patient states that every time she has anesthesia, she wakes up vomiting, has chills, blood pressure drops CAN NOT TAKE , aLPRAZOLAM, OR ELAVIL DUE TO AFIB FOR ANXIETY  IV CONTRAST /"DYE". Other reaction(s): hives and sores IV CONTRAST /"DYE". Patient states that every time she has anesthesia,  she wakes up vomiting, has chills, blood pressure drops CAN NOT TAKE , aLPRAZOLAM, OR ELAVIL DUE TO AFIB FOR ANXIETY  IV CONTRAST /"DYE". Other reaction(s): hives and sores IV CONTRAST /"DYE". CAN NOT TAKE , aLPRAZOLAM, OR ELAVIL DUE TO AFIB FOR ANXIETY  IV CONTRAST /"DYE". Other reaction(s): hives and sores Patient states that every time she has anesthesia, she wakes up vomiting, has chills, blood pressure drops CAN NOT TAKE , aLPRAZOLAM, OR ELAVIL DUE TO AFIB FOR ANXIETY  IV CONTRAST /"DYE". Other reaction(s): hives and sores   Tape Rash, Other (See Comments) and Itching    Heart monitor stickers must be  rotated in order to prevent rash Other reaction(s): hives and sores Adhesive on EKG tabs=BURNS SKIN**PAPER TAPE OK** sores Adhesive on EKG tabs. Per Pt, Ok w/Paper Tape Adhesive on EKG tabs=BURNS SKIN**PAPER TAPE OK** sores Other reaction(s): Other (See Comments) Heart monitor stickers must be rotated in order to prevent rash Adhesive on EKG tabs=BURNS SKIN**PAPER TAPE OK** sores   Ciprofloxacin Other (See Comments)    Other reaction(s): Other (See Comments) Burning all over Burning all over Burning all over   Duloxetine Hcl     Severe diarrhea and upset stomach Other reaction(s): Other (See Comments), Unknown Severe diarrhea and upset stomach Severe diarrhea and upset stomach Severe diarrhea and upset stomach   Erythromycin Base     Other reaction(s): Other (See Comments), Unknown   Hydrocodone Other (See Comments)    Sedation,dizziness, and nausea Other reaction(s): sedation and nausea Other reaction(s): sedation and nausea Other reaction(s): Other (See Comments), Other (See Comments), sedation and nausea Sedation,dizziness, and nausea Other reaction(s): sedation and nausea Other reaction(s): sedation and nausea Sedation,dizziness, and nausea Other reaction(s): sedation and nausea Other reaction(s): sedation and nausea Sedation,dizziness, and nausea Other reaction(s): sedation and nausea Other reaction(s): sedation and nausea Sedation,dizziness, and nausea Other reaction(s): sedation and nausea Other reaction(s): sedation and nausea Sedation,dizziness, and nausea Other reaction(s): sedation and nausea Other reaction(s): sedation and nausea Sedation,dizziness, and nausea Other reaction(s): sedation and nausea Other reaction(s): sedation and nausea   Hydromorphone Other (See Comments)    Sedation,dizziness, and nausea Other reaction(s): sedation and nausea Other reaction(s): sedation and nausea Other reaction(s): Other (See Comments), Other (See Comments), sedation  and nausea Sedation,dizziness, and nausea Other reaction(s): sedation and nausea Other reaction(s): sedation and nausea Sedation,dizziness, and nausea Other reaction(s): sedation and nausea Other reaction(s): sedation and nausea Sedation,dizziness, and nausea Other reaction(s): sedation and nausea Other reaction(s): sedation and nausea Sedation,dizziness, and nausea Other reaction(s): sedation and nausea Other reaction(s): sedation and nausea Sedation,dizziness, and nausea Other reaction(s): sedation and nausea Other reaction(s): sedation and nausea Sedation,dizziness, and nausea Other reaction(s): sedation and nausea Other reaction(s): sedation and nausea   Iodine     unknown Other reaction(s): Other (See Comments) unknown unknown unknown unknown unknown unknown   Levofloxacin Nausea And Vomiting and Nausea Only   Metrizamide Hives   Nsaids     CANNOT TAKE PER CARDIOLOGIST DUE TO AFIB    Nucynta [Tapentadol Hcl]     nausea and sedation   Oxycodone Other (See Comments)    Delusions (intolerance)  PILLS ONLY sedation, dizziness, nausea,  and itching Other reaction(s): sedation and nausea Other reaction(s): sedation and nausea Other reaction(s): Hallucinations, Other Other reaction(s): Other (See Comments), sedation and nausea Delusions (intolerance)  PILLS ONLY sedation, dizziness, nausea,  and itching Other reaction(s): sedation and nausea Other reaction(s): sedation and nausea Delusions (intolerance)  PILLS ONLY sedation, dizziness, nausea,  and itching Other reaction(s): sedation  and nausea Other reaction(s): sedation and nausea Delusions (intolerance)  PILLS ONLY sedation, dizziness, nausea,  and itching Other reaction(s): sedation and nausea Other reaction(s): sedation and nausea Delusions (intolerance)  PILLS ONLY sedation, dizziness, nausea,  and itching Other reaction(s): sedation and nausea Other reaction(s): sedation and nausea Delusions (intolerance)   PILLS ONLY sedation, dizziness, nausea,  and itching Other reaction(s): sedation and nausea Other reaction(s): sedation and nausea Delusions (intolerance)  PILLS ONLY sedation, dizziness, nausea,  and itching Other reaction(s): sedation and nausea Other reaction(s): sedation and nausea   Pentazocine Other (See Comments)    Unknown Other reaction(s): Unknown Other reaction(s): Unknown Other reaction(s): Other (See Comments), Other (See Comments), Unknown Unknown Other reaction(s): Unknown Other reaction(s): Unknown Unknown Other reaction(s): Unknown Other reaction(s): Unknown Unknown Other reaction(s): Unknown Other reaction(s): Unknown Unknown Other reaction(s): Unknown Other reaction(s): Unknown Unknown Other reaction(s): Unknown Other reaction(s): Unknown Unknown Other reaction(s): Unknown Other reaction(s): Unknown   Septra [Bactrim]     Hives    Sulfasalazine Hives   Morphine Anxiety, Other (See Comments) and Nausea Only    sedation and nausea Other reaction(s): sedation and nausea Other reaction(s): sedation and nausea Other reaction(s): Delusions (intolerance), Other (See Comments), Other (See Comments), sedation and nausea sedation and nausea Other reaction(s): sedation and nausea Other reaction(s): sedation and nausea sedation and nausea Other reaction(s): sedation and nausea Other reaction(s): sedation and nausea PT CANNOT WAKE UP AFTER TAKING MEDICATION AND HAS NIGHTMARES  sedation and nausea Other reaction(s): sedation and nausea Other reaction(s): sedation and nausea PT CANNOT WAKE UP AFTER TAKING MEDICATION AND HAS NIGHTMARES  sedation and nausea Other reaction(s): sedation and nausea Other reaction(s): sedation and nausea sedation and nausea Other reaction(s): sedation and nausea Other reaction(s): sedation and nausea PT CANNOT WAKE UP AFTER TAKING MEDICATION AND HAS NIGHTMARES  sedation and nausea Other reaction(s): sedation and nausea Other  reaction(s): sedation and nausea   Sulfa Antibiotics Hives, Nausea Only and Rash    rash rash rash   Sulfamethoxazole-Trimethoprim Rash    Hives  Hives  Hives  Hives  Hives  Hives    Tapentadol Nausea Only    Other reaction(s): nausea and sedation, Other (See Comments) nausea and sedation nausea and sedation    History of Present Illness    [name] is a 77 year old female who we are following for ongoing assessment and management of CAD, PAF, status post mitral valve replacement.  She also has a history of longstanding chronic dyspnea, and has remained on Lasix.  She is added onto our schedule today for complaints of dyspnea and bilateral lower extremity edema in the ankles which show no improvement after taking diuretic.  She was increased on her furosemide dose to 80 mg daily for 3 days and then to return to 40 mg daily.  She comes today continuing to have chronic shortness of breath which has not improved with Lasix increase initially, but on requestioning she does state that the breathing is somewhat better.  Lower extremity edema has improved.  She is being followed by pulmonologist, Dr. Erin Fulling who is doing a workup.  She is not currently candidate for oxygen therapy.  She is very emotional and tearful concerning all of her ailments and diagnoses over the last several years.  She has been to multiple specialists.  She has sought counseling has not found the right fit, she is also searching for psychiatrist to help manage her depression and anxiety.  She is adamant that she wants to get better, feel better,  so that she can be there for her son.  Home Medications    Current Outpatient Medications  Medication Sig Dispense Refill   acetaminophen (TYLENOL) 500 MG tablet Take 500 mg by mouth every 6 (six) hours as needed for moderate pain.      apixaban (ELIQUIS) 5 MG TABS tablet Take 1 tablet (5 mg total) by mouth 2 (two) times daily. 60 tablet 5   b complex vitamins tablet Take 1  tablet by mouth daily.     Calcium Carbonate Antacid (TUMS CHEWY BITES PO) Take 1 tablet by mouth daily as needed (reflux).      Calcium Citrate-Vitamin D (CALCIUM + D PO) Take 1 tablet by mouth daily.     Carboxymethylcellul-Glycerin (LUBRICATING EYE DROPS OP) Place 1 drop into both eyes daily as needed (dry eyes).     cephALEXin (KEFLEX) 500 MG capsule Take 2 grams (4 tablets) by mouth 1 hour prior to dental cleaning/procedure. 4 capsule 2   clonazePAM (KLONOPIN) 0.5 MG tablet Take 1 tablet by mouth twice daily as needed for anxiety 30 tablet 3   denosumab (PROLIA) 60 MG/ML SOSY injection Inject 60 mg into the skin every 6 (six) months.     furosemide (LASIX) 40 MG tablet Take 1 tablet (40 MG) by mouth on Monday, Wednesday, Friday and Sunday. 90 tablet 3   furosemide (LASIX) 40 MG tablet Take 1.5 tablets (60 mg total) by mouth as directed. Take 1.5 tablet by mouth on Tuesday, Thursday, and Saturday. 90 tablet 3   levothyroxine (SYNTHROID) 75 MCG tablet Take 1 tablet by mouth once daily 90 tablet 0   meclizine (ANTIVERT) 25 MG tablet TAKE 1 TABLET BY MOUTH THREE TIMES DAILY AS NEEDED FOR DIZZINESS 30 tablet 0   metoprolol tartrate (LOPRESSOR) 25 MG tablet Take 0.5 tablets (12.5 mg total) by mouth 2 (two) times daily. 90 tablet 3   mupirocin ointment (BACTROBAN) 2 % Apply 1 Application topically daily.     Nutritional Supplements (,FEEDING SUPPLEMENT, PROSOURCE PLUS) liquid Take 30 mLs by mouth 2 (two) times daily between meals. 887 mL 0   nystatin (MYCOSTATIN/NYSTOP) powder Apply topically 2 (two) times daily as needed (skin irritation (under breasts)). 15 g 0   ondansetron (ZOFRAN) 4 MG tablet Take 1 tablet (4 mg total) by mouth every 8 (eight) hours as needed. 30 tablet 0   oxyCODONE (ROXICODONE) 5 MG/5ML solution Take 4 mLs (4 mg total) by mouth every 4 (four) hours as needed for moderate pain.  0   polyethylene glycol (MIRALAX / GLYCOLAX) packet Take 17 g by mouth daily as needed for mild  constipation.     potassium chloride SA (KLOR-CON M) 20 MEQ tablet Take 1 tablet (20 mEq total) by mouth daily. 90 tablet 3   sodium chloride (OCEAN) 0.65 % SOLN nasal spray Place 1 spray into both nostrils as needed for congestion (nose bleeds).     valACYclovir (VALTREX) 500 MG tablet Take 500 mg by mouth daily as needed (breakouts).      No current facility-administered medications for this visit.     Family History    Family History  Problem Relation Age of Onset   Diabetes Father    Hyperlipidemia Sister    Heart disease Sister    Stroke Sister    Diabetes Brother    Hyperlipidemia Sister    Heart disease Sister    Arthritis Mother    Heart disease Mother    Uterine cancer Mother    Diabetes Brother  Heart Problems Brother    She indicated that her mother is deceased. She indicated that her father is deceased. She indicated that all of her four sisters are alive. She indicated that three of her four brothers are alive. She indicated that her maternal grandmother is deceased. She indicated that her maternal grandfather is deceased. She indicated that her paternal grandmother is deceased. She indicated that her paternal grandfather is deceased. She indicated that her son is alive.  Social History    Social History   Socioeconomic History   Marital status: Divorced    Spouse name: none/divorced   Number of children: Not on file   Years of education: Not on file   Highest education level: 12th grade  Occupational History   Occupation: retired  Tobacco Use   Smoking status: Former    Packs/day: 0.10    Years: 14.00    Additional pack years: 0.00    Total pack years: 1.40    Types: Cigarettes    Quit date: 1980    Years since quitting: 44.2   Smokeless tobacco: Never   Tobacco comments:    03/31/2018 "quit ~ 1980; someday smoker when I did smoke; never addicted"  Vaping Use   Vaping Use: Never used  Substance and Sexual Activity   Alcohol use: Never   Drug use:  Yes    Types: Oxycodone, Benzodiazepines    Comment: 03/31/2018 "for chronic neck and back pain", takes Klonopin at times.    Sexual activity: Not Currently  Other Topics Concern   Not on file  Social History Narrative   Lives by herself.    Social Determinants of Health   Financial Resource Strain: Low Risk  (10/10/2021)   Overall Financial Resource Strain (CARDIA)    Difficulty of Paying Living Expenses: Not hard at all  Food Insecurity: No Food Insecurity (10/10/2021)   Hunger Vital Sign    Worried About Running Out of Food in the Last Year: Never true    Ran Out of Food in the Last Year: Never true  Transportation Needs: No Transportation Needs (10/10/2021)   PRAPARE - Hydrologist (Medical): No    Lack of Transportation (Non-Medical): No  Physical Activity: Inactive (10/10/2021)   Exercise Vital Sign    Days of Exercise per Week: 0 days    Minutes of Exercise per Session: 0 min  Stress: No Stress Concern Present (10/10/2021)   [place]    Feeling of Stress : Not at all  Social Connections: Socially Isolated (10/10/2021)   Social Connection and Isolation Panel [NHANES]    Frequency of Communication with Friends and Family: Twice a week    Frequency of Social Gatherings with Friends and Family: Twice a week    Attends Religious Services: Never    Marine scientist or Organizations: No    Attends Archivist Meetings: Never    Marital Status: Divorced  Human resources officer Violence: Not At Risk (10/10/2021)   Humiliation, Afraid, Rape, and Kick questionnaire    Fear of Current or Ex-Partner: No    Emotionally Abused: No    Physically Abused: No    Sexually Abused: No     Review of Systems    General:  No chills, fever, night sweats or weight changes.  Cardiovascular:  No chest pain, dyspnea on exertion, edema, orthopnea, palpitations, paroxysmal nocturnal  dyspnea. Dermatological: No rash, lesions/masses Respiratory: No cough, dyspnea Urologic: No  hematuria, dysuria Abdominal:   No nausea, vomiting, diarrhea, bright red blood per rectum, melena, or hematemesis Neurologic:  No visual changes, wkns, changes in mental status. All other systems reviewed and are otherwise negative except as noted above.    Cardiac Rehabilitation Eligibility Assessment  The patient has declined or is not appropriate for cardiac rehabilitation.    Physical Exam    VS:  BP 118/72   Pulse 67   Ht 5\' 6"  (1.676 m)   Wt 112 lb 3.2 oz (50.9 kg)   SpO2 95%   BMI 18.11 kg/m  , BMI Body mass index is 18.11 kg/m.     GEN: Well nourished, well developed, in no acute distress.  Thin. HEENT: normal. Neck: Supple, no JVD, carotid bruits, or masses. Cardiac: RRR, 2/6 systolic murmur heard at the left sternal border murmurs, rubs, or gallops. No clubbing, cyanosis, edema.  Radials/DP/PT 2+ and equal bilaterally.  Respiratory:  Respirations regular and unlabored, clear to auscultation bilaterally, mild crackles in the bases. GI: Soft, nontender, nondistended, BS + x 4. MS: no deformity or atrophy. Skin: warm and dry, no rash. Neuro:  Strength and sensation are intact. Psych: Tearful and anxious  Accessory Clinical Findings    ECG personally reviewed by me today-not completed this office visit.  Lab Results  Component Value Date   WBC 7.2 08/20/2022   HGB 13.6 08/20/2022   HCT 40.9 08/20/2022   MCV 89.3 08/20/2022   PLT 193.0 08/20/2022   Lab Results  Component Value Date   CREATININE 0.90 08/20/2022   BUN 32 (H) 08/20/2022   NA 142 08/20/2022   K 4.6 08/20/2022   CL 96 08/20/2022   CO2 33 (H) 08/20/2022   Lab Results  Component Value Date   ALT 22 10/31/2021   AST 25 10/31/2021   ALKPHOS 70 10/31/2021   BILITOT 0.5 10/31/2021   Lab Results  Component Value Date   CHOL 175 12/10/2020   HDL 80.40 12/10/2020   LDLCALC 79 12/10/2020   LDLDIRECT  136.5 06/08/2013   TRIG 79.0 12/10/2020   CHOLHDL 2 12/10/2020    Lab Results  Component Value Date   HGBA1C 5.6 04/16/2020    Review of Prior Studies:  Echocardiogram 02/11/2022 1. Left ventricular ejection fraction, by estimation, is 50 to 55%. The  left ventricle has low normal function. Left ventricular endocardial  border not optimally defined to evaluate regional wall motion. Left  ventricular diastolic function could not be  evaluated. There is the interventricular septum is flattened in systole,  consistent with right ventricular pressure overload.   2. Right ventricular systolic function is moderately reduced. The right  ventricular size is mildly enlarged. There is mildly elevated pulmonary  artery systolic pressure. The estimated right ventricular systolic  pressure is [id number] mmHg.   3. Right atrial size was mildly dilated.   4. The mitral valve has been repaired/replaced. Trivial mitral valve  regurgitation. No evidence of mitral stenosis. The mean mitral valve  gradient is 1.9 mmHg with average heart rate of 59 bpm. There is a 32 mm  Sorin Memo 4D prosthetic annuloplasty ring   present in the mitral position.   LHC/RHC 12/02/2022 Prox RCA lesion is 20% stenosed. Dist LAD lesion is 20% stenosed.   1. Mild non-obstructive CAD Assessment & Plan   1.  Chronic dyspnea: Being followed by pulmonologist Dr. Erin Fulling.  States with increased dose of Lasix she has had some improvement in her breathing status.  Review of echo could  not determine diastolic function.  Checking CBC  2.  Chronic diastolic CHF: Review echo could not quantify diastolic dysfunction however there was evidence of right ventricular overload.  I am going to continue her on her Lasix but alternated between 40 mg daily and 60 mg daily every other day.  I will be checking some labs to evaluate further to include a CMET for renal function.  3.  Hypertension: Excellent control of blood pressure on office visit.   Continue metoprolol and furosemide as discussed above.  4.  Paroxysmal atrial fibrillation: Currently on Eliquis and metoprolol for heart rate control.  Checking CBC.  5.  Thyroid disease: Followed by PCP.  She is requesting follow-up TSH and hemoglobin A1c with blood draw as she is also concerned about whether or not diabetes is playing a part in her overall symptoms.  This will be deferred to primary care if abnormal..    Cardiac Rehabilitation Eligibility Assessment  The patient has declined or is not appropriate for cardiac rehabilitation.       Signed, Phill Myron. [place] Pugh, ANP, [place]   10/03/2022 5:26 PM      Office [phone] Fax [phone]  Notice: This dictation was prepared with Dragon dictation along with smaller phrase technology. Any transcriptional errors that result from this process are unintentional and may not be corrected upon review.

## 2022-10-03 NOTE — Telephone Encounter (Signed)
Received a call from patient she stated she doubled Lasix dose for the past 3 days.This morning she took her normal dose.Stated she is sob.Stated she has been sob all week.She forgot to mention she has been sob.Stated Palliative care RN saw her this week and heard fluid in her lungs.Stated swelling in ankles has improved.Appointment scheduled with Jory Sims DNP today at 2:45 pm.

## 2022-10-04 LAB — CBC
Hematocrit: 38.6 % (ref 34.0–46.6)
Hemoglobin: 13 g/dL (ref 11.1–15.9)
MCH: 30 pg (ref 26.6–33.0)
MCHC: 33.7 g/dL (ref 31.5–35.7)
MCV: 89 fL (ref 79–97)
Platelets: 173 10*3/uL (ref 150–450)
RBC: 4.33 x10E6/uL (ref 3.77–5.28)
RDW: 12.3 % (ref 11.7–15.4)
WBC: 5.9 10*3/uL (ref 3.4–10.8)

## 2022-10-04 LAB — COMPREHENSIVE METABOLIC PANEL
ALT: 18 IU/L (ref 0–32)
AST: 28 IU/L (ref 0–40)
Albumin/Globulin Ratio: 1.7 (ref 1.2–2.2)
Albumin: 4.5 g/dL (ref 3.8–4.8)
Alkaline Phosphatase: 75 IU/L (ref 44–121)
BUN/Creatinine Ratio: 28 (ref 12–28)
BUN: 21 mg/dL (ref 8–27)
Bilirubin Total: 0.5 mg/dL (ref 0.0–1.2)
CO2: 28 mmol/L (ref 20–29)
Calcium: 9.6 mg/dL (ref 8.7–10.3)
Chloride: 96 mmol/L (ref 96–106)
Creatinine, Ser: 0.76 mg/dL (ref 0.57–1.00)
Globulin, Total: 2.7 g/dL (ref 1.5–4.5)
Glucose: 86 mg/dL (ref 70–99)
Potassium: 4.6 mmol/L (ref 3.5–5.2)
Sodium: 142 mmol/L (ref 134–144)
Total Protein: 7.2 g/dL (ref 6.0–8.5)
eGFR: 80 mL/min/{1.73_m2} (ref >59)

## 2022-10-04 LAB — TSH: TSH: 0.752 u[IU]/mL (ref 0.450–4.500)

## 2022-10-04 LAB — HEMOGLOBIN A1C
Est. average glucose Bld gHb Est-mCnc: 120 mg/dL
Hgb A1c MFr Bld: 5.8 % — ABNORMAL HIGH (ref 4.8–5.6)

## 2022-10-05 DIAGNOSIS — I499 Cardiac arrhythmia, unspecified: Secondary | ICD-10-CM | POA: Diagnosis not present

## 2022-10-05 DIAGNOSIS — I10 Essential (primary) hypertension: Secondary | ICD-10-CM | POA: Diagnosis not present

## 2022-10-06 ENCOUNTER — Telehealth: Payer: Self-pay

## 2022-10-06 NOTE — Telephone Encounter (Signed)
[facility] sent forms to be signed . Placed in [name] to be signed folder

## 2022-10-07 ENCOUNTER — Telehealth: Payer: Self-pay | Admitting: Cardiology

## 2022-10-07 ENCOUNTER — Ambulatory Visit (HOSPITAL_COMMUNITY): Payer: Medicare Other

## 2022-10-07 ENCOUNTER — Telehealth: Payer: Self-pay | Admitting: Pulmonary Disease

## 2022-10-07 ENCOUNTER — Other Ambulatory Visit: Payer: Self-pay | Admitting: Pulmonary Disease

## 2022-10-07 ENCOUNTER — Other Ambulatory Visit (HOSPITAL_COMMUNITY): Payer: Self-pay

## 2022-10-07 DIAGNOSIS — J479 Bronchiectasis, uncomplicated: Secondary | ICD-10-CM

## 2022-10-07 DIAGNOSIS — R0602 Shortness of breath: Secondary | ICD-10-CM

## 2022-10-07 MED ORDER — REVEFENACIN 175 MCG/3ML IN SOLN: 175.0000 ug | Freq: Every day | RESPIRATORY_TRACT | 11 refills | Status: DC

## 2022-10-07 MED ORDER — METOPROLOL TARTRATE 25 MG PO TABS: 25.0000 mg | ORAL_TABLET | Freq: Two times a day (BID) | ORAL | 3 refills | Status: DC

## 2022-10-07 MED ORDER — YUPELRI 175 MCG/3ML IN SOLN: 175.0000 ug | Freq: Every day | RESPIRATORY_TRACT | 11 refills | Status: DC

## 2022-10-07 NOTE — Telephone Encounter (Signed)
Spoke with patient advised per Jd's last note she is to continue using Yupelri neb medication. She asked how much this would cost and I advised I wasn't sure but it should be covered with her insurance. She states she received a nebulizer medication with machine and it was free and wanted this sent in. She wasn't sure of the name but would call the office back with that information. Leaving encounter open

## 2022-10-07 NOTE — Telephone Encounter (Signed)
  Pt is calling back, she also wanted to get results of her tests

## 2022-10-07 NOTE — Telephone Encounter (Signed)
Forms faxed and placed in scan  

## 2022-10-07 NOTE — Telephone Encounter (Signed)
Pt calling back states that she was told that Dr. Erin Fulling should have the information as to where it is she got the nebulizer for free.

## 2022-10-07 NOTE — Telephone Encounter (Signed)
Regina Perla, MD  Cristopher Estimable, RN45 minutes ago (9:18 AM)    Increase lopressor to 25 BID [name]   Spoke with pt with Dr. Jacalyn Lefevre recommendations. She will increase metoprolol tartrate to 25mg  twice daily.

## 2022-10-07 NOTE — Telephone Encounter (Signed)
ATC LVMTCB x 1  

## 2022-10-07 NOTE — Telephone Encounter (Signed)
Pt c/o medication issue:  1. Name of Medication:  Metoprolol   2. How are you currently taking this medication (dosage and times per day)?   3. Are you having a reaction (difficulty breathing--STAT)?   4. What is your medication issue?   Patient states she does not have any readings, but her BP/HR have been elevated recently. She assumes metoprolol may need to be adjusted

## 2022-10-07 NOTE — Addendum Note (Signed)
Addended by: Gavin Potters R on: 10/07/2022 04:00 PM   Modules accepted: Orders

## 2022-10-07 NOTE — Addendum Note (Signed)
Addended by: Gavin Potters R on: 10/07/2022 03:53 PM   Modules accepted: Orders

## 2022-10-07 NOTE — Telephone Encounter (Signed)
Can we run a ticket on patients insurance to see what's covered and cheaper then Yupelri?  Please route message back to triage

## 2022-10-07 NOTE — Telephone Encounter (Signed)
Spoke with pt and informed her Maretta Bees sound be covered by her Medicare part B if Rx is sent through mail pharmacy. Pt stated understanding. Yupelri order was placed with [facility]. Nothing further needed at this time.

## 2022-10-07 NOTE — Telephone Encounter (Signed)
Form signed and returned to [facility] 

## 2022-10-07 NOTE — Telephone Encounter (Signed)
Nebulizer solutions are typically covered under Medicare Part B, unable to do a benefits investigation due to this.

## 2022-10-07 NOTE — Telephone Encounter (Signed)
Spoke with pt who states that she feels like she is going in and out of a-fib since Saturday. Pt was here on Friday (3/29) to see Jory Sims, NP for issues with shortness of breath and lower extremity swelling. Curt Bears recommended that pt take lasix 40mg  every Monday, Wednesday, Friday and Sunday, alternating with lasix 60mg  every Tuesday, Thursday, and Saturday. Pt states that her swelling has improved, only small amount of swelling still remains.   Pt states that she feels anxious when she is in a-fib and would like to avoid going to the hospital. Pt states that she has noticed that her blood pressure and heart rate have been up more in the last 3 days. Pt is able to give me ranges on her blood pressure and heart rate. Her blood pressure is usually between 103-114 over 70-80, but over the weekend has been 140-150 over 80-90. Her heart rate is typically around 70-80bpm, however over the weekend it has been in the 90- low 100 range. Pt states that she feels like she is going into a-fib in the evening. She is also worrisome because she lives alone.  Pt she is eating and drinking like she has been, no changes with that. Pt has a good appetite and she tries to sip on liquids (water, juice and occasional cola) throughout the day. Pt states that we recently decreased her metoprolol tartrate to 12.5mg  twice daily (3/7 OV). Pt would like to go back to metoprolol tartrate 25mg  twice daily. Pt states that medication was decreased to help with fatigue, however she states she would rather have a bit of fatigue than to deal with her heart issues. Will send to provider to advise.

## 2022-10-08 ENCOUNTER — Telehealth: Payer: Self-pay

## 2022-10-08 NOTE — Telephone Encounter (Signed)
Called patient regarding results. Patient had understanding of results. 

## 2022-10-09 ENCOUNTER — Ambulatory Visit (HOSPITAL_COMMUNITY): Payer: Medicare Other

## 2022-10-14 ENCOUNTER — Ambulatory Visit (HOSPITAL_COMMUNITY): Payer: Medicare Other

## 2022-10-14 ENCOUNTER — Telehealth: Payer: Self-pay | Admitting: Pulmonary Disease

## 2022-10-14 NOTE — Telephone Encounter (Signed)
Mariam from [employer] would like insurance information and office notes for [employer]. Fax number is [phone]. Phone number is [phone].

## 2022-10-14 NOTE — Telephone Encounter (Signed)
Patient checking on Nebulizer medication. Patient phone number is [phone].

## 2022-10-15 NOTE — Telephone Encounter (Signed)
Spoke with the pt and notified of response per Dr Francine Graven. I repeated the results and recommendations to her twice. She did verbalized understanding and states nothing further needed. I advised will discuss more on return visit.

## 2022-10-15 NOTE — Telephone Encounter (Signed)
Spoke with the pt  I advised that we are faxing her ins info and ov note to [facility] and then they will contact her with confirmation  Notes faxed to the number provided in the msg along with ins card   Pt is also asking for her ONO results  She states that she had ONO with [employer] approx 1 month ago  I called [employer] and they are going to fax results to me in triage  Will await results and then give to Dr Francine Graven to review

## 2022-10-15 NOTE — Telephone Encounter (Signed)
Patient checking on message for Nebulizer solution. Patient phone number is [phone].

## 2022-10-15 NOTE — Telephone Encounter (Signed)
Patient and I discussed her ONO results at last visit.   From OV 09/23/22 ONO showed 1 hr 51 minutes with SpO2 less than 88%. Her oxygen desaturation index was 37 events per hour. Studies have shown that the ODI is a high predictor of sleep disordered breathing such as sleep apnea.    We will hold off on initiating nocturnal oxygen therapy at this time given her epistaxis issues. She is to use saline nasal spray and vaseline per ENT recommendations   Thanks, JD

## 2022-10-15 NOTE — Telephone Encounter (Signed)
ONO received and placed in Dr Lanora Manis lookat folder for review  Please advise once you have reviewed, thanks!

## 2022-10-16 ENCOUNTER — Telehealth: Payer: Self-pay | Admitting: Cardiology

## 2022-10-16 ENCOUNTER — Ambulatory Visit (HOSPITAL_COMMUNITY): Payer: Medicare Other

## 2022-10-16 NOTE — Telephone Encounter (Signed)
Pt c/o swelling: STAT is pt has developed SOB within 24 hours  If swelling, where is the swelling located? Ankles, left swells more than right.  How much weight have you gained and in what time span? Has not gained any weight  Have you gained 3 pounds in a day or 5 pounds in a week? No   Do you have a log of your daily weights (if so, list)? Has remained the same 112 lbs  Are you currently taking a fluid pill? Yes  Are you currently SOB? Yes, states she always has SOB   Have you traveled recently? No    Still having swelling in legs.

## 2022-10-16 NOTE — Telephone Encounter (Signed)
Patient calling regarding swelling. Per Dr. Ludwig Clarks recommendations. She will increase metoprolol tartrate to 25mg  twice daily. She is currently taking this dose and it is working "well for me". No  palpitations since increasing the medication.   For issues with shortness of breath and lower extremity swelling. She was recommended to take lasix 40mg  every Monday, Wednesday, Friday and Sunday, alternating with lasix 60mg  every Tuesday, Thursday, and Saturday. Pt states that her swelling has gotten worse the last few days or week   She states, "Left side of my body is worse than the right.  It is not down around the ankle as much is it is on the top of the foot where it turns to the inside and its puffy. The right side has a little around the foot and the ankle."  She states she has "asked multiple doctors why my foot turns in like that".  Advised I cannot answer as I have not seen her but she can discuss with the doctor at next appt. States SOB is the same as it always is.   I advised at this time to continue the Lasix 40mg /60mg  as directed with no change.  Follow up with Sherlean Foot 4/29 (verified date and time twice),.  Avoid excess sodium and elevate legs as much as possible during the day.   If further instructions please advise.

## 2022-10-17 ENCOUNTER — Telehealth: Payer: Self-pay | Admitting: Cardiology

## 2022-10-17 NOTE — Telephone Encounter (Signed)
(  Please see previous note too) I was on the phone with this pt for 32 minutes.. No chest pain, no arm/jaw/back pain, no sweating, no nausea, reports sob with activity- that is normal for her. Had an instance of chest pain last night  She had some dizziness when she came downstairs this morning. But no dizziness at this time; she feels "out of it."  Pt reports that her swelling is worse than it has ever been in her left ankle. From sitting for just an hour is got very swollen. Since laying down/elevating legs it has already gone down a little bit. Her BP is up= 153/90 hr 71  She reports that she drinks about 3 bottles of water a day- 16-20oz each- she states that she sips on it all throughout the day   She reports that she has increased swelling, but did not gain any weight from the swelling.    Advised that she make sure she is staying "hydrated" and drink maybe a glass of juice as well; elevating her legs throughout the day; Make sure she is eating well.  Gave pt ER precautions. Told that I would send this information to her provider. She has a follow up appt 11/03/22 with Edd Fabian, NP.  Her last lab work was 10/03/22.

## 2022-10-17 NOTE — Telephone Encounter (Signed)
Pt c/o of Chest Pain: STAT if CP now or developed within 24 hours  1. Are you having CP right now? Doesn't have the pain right now. She states she had pain in her chest area early this morning.   2. Are you experiencing any other symptoms (ex. SOB, nausea, vomiting, sweating)? She states she always has sob. No nausea or throwing up. States her mouth is really dry.Regina KitchenMarland KitchenShe says just being on the phone, she can't keep her mouth moist. She has to continually sip on water. Pain started started in chest and then went to her back. She states she thought she was never going to get the pain to go away in her back. She has a hx of back pain. Pain was under her left breast area. She says that to get the pain to go away, she laid a heating pad under her breast to get it to go away - she says that she also does this with her back pain.  Pt says she feels a little. She states she doesn't feel right today.   3. How long have you been experiencing CP? Hasn't had any pain in her chest prior to today.   4. Is your CP continuous or coming and going? It was continuous until it got under control.   5. Have you taken Nitroglycerin? No, she states she doesn't have any. She states she took tylenol, took some tums since she thought it was indigestion and put some cream on.  ?  Pt c/o swelling: STAT is pt has developed SOB within 24 hours  How much weight have you gained and in what time span? Pt is unsure of any weight gain, but says that her feet are 2-3x the size from yesterday.     Pt did check weight while on the phone, and she says that it  appears she has lost a couple pounds.   If swelling, where is the swelling located? Was initially on the inner side of the left foot, but now swelling is located on the outer side of her left ankle.   Are you currently taking a fluid pill? Yes, 40mg  x4 days a week and 60mg  x3 days a week  Are you currently SOB?   Do you have a log of your daily weights (if so, list)?   Have  you gained 3 pounds in a day or 5 pounds in a week?   Have you traveled recently? No   Call transferred to triage.

## 2022-10-20 ENCOUNTER — Encounter: Payer: Self-pay | Admitting: *Deleted

## 2022-10-21 ENCOUNTER — Telehealth: Payer: Self-pay | Admitting: Pulmonary Disease

## 2022-10-21 ENCOUNTER — Ambulatory Visit (HOSPITAL_COMMUNITY): Payer: Medicare Other

## 2022-10-21 NOTE — Telephone Encounter (Signed)
Demon and last office notes have been printed and faxed to [facility]. Nothing further needed

## 2022-10-21 NOTE — Telephone Encounter (Signed)
[name], out Drug Rep for [name] is calling. She states Pharmacy needs "Demographics and last office notes" in order to fill this [name] request.  [facility] is Pharm: [phone]  TY

## 2022-10-21 NOTE — Telephone Encounter (Signed)
Spoke with pt, Aware of dr crenshaw's recommendations.  °

## 2022-10-22 ENCOUNTER — Other Ambulatory Visit: Payer: Self-pay

## 2022-10-22 ENCOUNTER — Telehealth: Payer: Self-pay | Admitting: Family Medicine

## 2022-10-22 DIAGNOSIS — E039 Hypothyroidism, unspecified: Secondary | ICD-10-CM

## 2022-10-22 NOTE — Telephone Encounter (Signed)
Marny Lowenstein w/[facility] called to inform the nurse that the fax that was sent is cutting off so they have not received the full fax.  They would like it re-faxed as soon as possible.  CB# [phone], Fax# [phone]

## 2022-10-22 NOTE — Telephone Encounter (Signed)
Caller name: VERSA CRATON  On DPR?: Yes  Call back number: [phone] (home)  Provider they see: Sheliah Hatch, MD  Reason for call: Thyroid blood draw needed. Nausea jaw pain swollen foot. Pt doesn't want to come twice. Can she have all this all in one visit? --Advise

## 2022-10-22 NOTE — Telephone Encounter (Signed)
I spoke to the pt and she aware she has an apt w/ dr Beverely Low on Monday 10/27/22 and we will draw her TSH at that time . Order is in

## 2022-10-22 NOTE — Telephone Encounter (Signed)
Called to schedule no answer, LM  

## 2022-10-22 NOTE — Telephone Encounter (Signed)
Yes she can have this done In one visit

## 2022-10-23 ENCOUNTER — Telehealth: Payer: Self-pay | Admitting: Family Medicine

## 2022-10-23 ENCOUNTER — Ambulatory Visit (INDEPENDENT_AMBULATORY_CARE_PROVIDER_SITE_OTHER): Payer: Medicare Other | Admitting: *Deleted

## 2022-10-23 ENCOUNTER — Ambulatory Visit (HOSPITAL_COMMUNITY): Payer: Medicare Other

## 2022-10-23 DIAGNOSIS — Z0000 Encounter for general adult medical examination without abnormal findings: Secondary | ICD-10-CM

## 2022-10-23 NOTE — Patient Instructions (Signed)
[name] , Thank you for taking time to come for your Medicare Wellness Visit. I appreciate your ongoing commitment to your health goals. Please review the following plan we discussed and let me know if I can assist you in the future.   Screening recommendations/referrals: Colonoscopy: no longer required Mammogram: up to date Bone Density: up to date Recommended yearly ophthalmology/optometry visit for glaucoma screening and checkup Recommended yearly dental visit for hygiene and checkup  Vaccinations: Influenza vaccine: Education provided Pneumococcal vaccine: up to date Tdap vaccine: up to date Shingles vaccine:  1 of 2    Advanced directives: Education provided     Preventive Care 78 Years and Older, Female Preventive care refers to lifestyle choices and visits with your health care provider that can promote health and wellness. What does preventive care include? A yearly physical exam. This is also called an annual well check. Dental exams once or twice a year. Routine eye exams. Ask your health care provider how often you should have your eyes checked. Personal lifestyle choices, including: Daily care of your teeth and gums. Regular physical activity. Eating a healthy diet. Avoiding tobacco and drug use. Limiting alcohol use. Practicing safe sex. Taking low-dose aspirin every day. Taking vitamin and mineral supplements as recommended by your health care provider. What happens during an annual well check? The services and screenings done by your health care provider during your annual well check will depend on your age, overall health, lifestyle risk factors, and family history of disease. Counseling  Your health care provider may ask you questions about your: Alcohol use. Tobacco use. Drug use. Emotional well-being. Home and relationship well-being. Sexual activity. Eating habits. History of falls. Memory and ability to understand (cognition). Work and work  Astronomer. Reproductive health. Screening  You may have the following tests or measurements: Height, weight, and BMI. Blood pressure. Lipid and cholesterol levels. These may be checked every 5 years, or more frequently if you are over 85 years old. Skin check. Lung cancer screening. You may have this screening every year starting at age 19 if you have a 30-pack-year history of smoking and currently smoke or have quit within the past 15 years. Fecal occult blood test (FOBT) of the stool. You may have this test every year starting at age 43. Flexible sigmoidoscopy or colonoscopy. You may have a sigmoidoscopy every 5 years or a colonoscopy every 10 years starting at age 36. Hepatitis C blood test. Hepatitis B blood test. Sexually transmitted disease (STD) testing. Diabetes screening. This is done by checking your blood sugar (glucose) after you have not eaten for a while (fasting). You may have this done every 1-3 years. Bone density scan. This is done to screen for osteoporosis. You may have this done starting at age 74. Mammogram. This may be done every 1-2 years. Talk to your health care provider about how often you should have regular mammograms. Talk with your health care provider about your test results, treatment options, and if necessary, the need for more tests. Vaccines  Your health care provider may recommend certain vaccines, such as: Influenza vaccine. This is recommended every year. Tetanus, diphtheria, and acellular pertussis (Tdap, Td) vaccine. You may need a Td booster every 10 years. Zoster vaccine. You may need this after age 54. Pneumococcal 13-valent conjugate (PCV13) vaccine. One dose is recommended after age 18. Pneumococcal polysaccharide (PPSV23) vaccine. One dose is recommended after age 59. Talk to your health care provider about which screenings and vaccines you need and  how often you need them. This information is not intended to replace advice given to you by  your health care provider. Make sure you discuss any questions you have with your health care provider. Document Released: 07/20/2015 Document Revised: 03/12/2016 Document Reviewed: 04/24/2015 Elsevier Interactive Patient Education  2017 [place] Prevention in the Home Falls can cause injuries. They can happen to people of all ages. There are many things you can do to make your home safe and to help prevent falls. What can I do on the outside of my home? Regularly fix the edges of walkways and driveways and fix any cracks. Remove anything that might make you trip as you walk through a door, such as a raised step or threshold. Trim any bushes or trees on the path to your home. Use bright outdoor lighting. Clear any walking paths of anything that might make someone trip, such as rocks or tools. Regularly check to see if handrails are loose or broken. Make sure that both sides of any steps have handrails. Any raised decks and porches should have guardrails on the edges. Have any leaves, snow, or ice cleared regularly. Use sand or salt on walking paths during winter. Clean up any spills in your garage right away. This includes oil or grease spills. What can I do in the bathroom? Use night lights. Install grab bars by the toilet and in the tub and shower. Do not use towel bars as grab bars. Use non-skid mats or decals in the tub or shower. If you need to sit down in the shower, use a plastic, non-slip stool. Keep the floor dry. Clean up any water that spills on the floor as soon as it happens. Remove soap buildup in the tub or shower regularly. Attach bath mats securely with double-sided non-slip rug tape. Do not have throw rugs and other things on the floor that can make you trip. What can I do in the bedroom? Use night lights. Make sure that you have a light by your bed that is easy to reach. Do not use any sheets or blankets that are too big for your bed. They should not hang  down onto the floor. Have a firm chair that has side arms. You can use this for support while you get dressed. Do not have throw rugs and other things on the floor that can make you trip. What can I do in the kitchen? Clean up any spills right away. Avoid walking on wet floors. Keep items that you use a lot in easy-to-reach places. If you need to reach something above you, use a strong step stool that has a grab bar. Keep electrical cords out of the way. Do not use floor polish or wax that makes floors slippery. If you must use wax, use non-skid floor wax. Do not have throw rugs and other things on the floor that can make you trip. What can I do with my stairs? Do not leave any items on the stairs. Make sure that there are handrails on both sides of the stairs and use them. Fix handrails that are broken or loose. Make sure that handrails are as long as the stairways. Check any carpeting to make sure that it is firmly attached to the stairs. Fix any carpet that is loose or worn. Avoid having throw rugs at the top or bottom of the stairs. If you do have throw rugs, attach them to the floor with carpet tape. Make sure that you have  a light switch at the top of the stairs and the bottom of the stairs. If you do not have them, ask someone to add them for you. What else can I do to help prevent falls? Wear shoes that: Do not have high heels. Have rubber bottoms. Are comfortable and fit you well. Are closed at the toe. Do not wear sandals. If you use a stepladder: Make sure that it is fully opened. Do not climb a closed stepladder. Make sure that both sides of the stepladder are locked into place. Ask someone to hold it for you, if possible. Clearly mark and make sure that you can see: Any grab bars or handrails. First and last steps. Where the edge of each step is. Use tools that help you move around (mobility aids) if they are needed. These  include: Canes. Walkers. Scooters. Crutches. Turn on the lights when you go into a dark area. Replace any light bulbs as soon as they burn out. Set up your furniture so you have a clear path. Avoid moving your furniture around. If any of your floors are uneven, fix them. If there are any pets around you, be aware of where they are. Review your medicines with your doctor. Some medicines can make you feel dizzy. This can increase your chance of falling. Ask your doctor what other things that you can do to help prevent falls. This information is not intended to replace advice given to you by your health care provider. Make sure you discuss any questions you have with your health care provider. Document Released: 04/19/2009 Document Revised: 11/29/2015 Document Reviewed: 07/28/2014 Elsevier Interactive Patient Education  2017 [employer].

## 2022-10-23 NOTE — Telephone Encounter (Signed)
Contacted [name] to schedule their annual wellness visit. Appointment made for 10/23/2022.  Thank you,  [facility] Support [facility] Direct dial  [phone]

## 2022-10-23 NOTE — Telephone Encounter (Signed)
Received a call from [employer] stating that they were still needing clinical information, insurance, and demographics info on pt. Stated to them that we have faxed all info to fax number [phone] and when we try to fax to the main fax number that they tell us, we get messages that the faxes are not going through. They said we can try to fax to [phone].  Fax has been sent to new fax number provided.

## 2022-10-23 NOTE — Progress Notes (Signed)
Subjective:   [name] is a 77 y.o. female who presents for Medicare Annual (Subsequent) preventive examination.  I connected with  [name] on 10/23/22 by a telephone enabled telemedicine application and verified that I am speaking with the correct person using two identifiers.   I discussed the limitations of evaluation and management by telemedicine. The patient expressed understanding and agreed to proceed.  Patient location: home  Provider location: Telephone/home  .   Review of Systems     Cardiac Risk Factors include: advanced age (>64men, >35 women);family history of premature cardiovascular disease;sedentary lifestyle;hypertension     Objective:    Today's Vitals   10/23/22 1303  PainSc: 6    There is no height or weight on file to calculate BMI.     10/23/2022    1:30 PM 10/23/2022    1:06 PM 10/10/2021    1:43 PM 05/07/2020    3:51 PM 02/20/2020    4:00 PM 02/03/2020    9:06 AM 02/02/2020    4:00 PM  Advanced Directives  Does Patient Have a Medical Advance Directive? No No No No No No   Would patient like information on creating a medical advance directive? No - Patient declined No - Patient declined No - Patient declined No - Patient declined No - Patient declined  No - Patient declined    Current Medications (verified) Outpatient Encounter Medications as of 10/23/2022  Medication Sig   acetaminophen (TYLENOL) 500 MG tablet Take 500 mg by mouth every 6 (six) hours as needed for moderate pain.    apixaban (ELIQUIS) 5 MG TABS tablet Take 1 tablet (5 mg total) by mouth 2 (two) times daily.   b complex vitamins tablet Take 1 tablet by mouth daily.   Calcium Carbonate Antacid (TUMS CHEWY BITES PO) Take 1 tablet by mouth daily as needed (reflux).    Calcium Citrate-Vitamin D (CALCIUM + D PO) Take 1 tablet by mouth daily.   Carboxymethylcellul-Glycerin (LUBRICATING EYE DROPS OP) Place 1 drop into both eyes daily as needed (dry eyes).   cephALEXin (KEFLEX) 500 MG  capsule Take 2 grams (4 tablets) by mouth 1 hour prior to dental cleaning/procedure.   clonazePAM (KLONOPIN) 0.5 MG tablet Take 1 tablet by mouth twice daily as needed for anxiety   denosumab (PROLIA) 60 MG/ML SOSY injection Inject 60 mg into the skin every 6 (six) months.   furosemide (LASIX) 40 MG tablet Take 1 tablet (40 MG) by mouth on Monday, Wednesday, Friday and Sunday.   furosemide (LASIX) 40 MG tablet Take 1.5 tablets (60 mg total) by mouth as directed. Take 1.5 tablet by mouth on Tuesday, Thursday, and Saturday.   levothyroxine (SYNTHROID) 75 MCG tablet Take 1 tablet by mouth once daily   meclizine (ANTIVERT) 25 MG tablet TAKE 1 TABLET BY MOUTH THREE TIMES DAILY AS NEEDED FOR DIZZINESS   metoprolol tartrate (LOPRESSOR) 25 MG tablet Take 1 tablet (25 mg total) by mouth 2 (two) times daily.   mupirocin ointment (BACTROBAN) 2 % Apply 1 Application topically daily.   Nutritional Supplements (,FEEDING SUPPLEMENT, PROSOURCE PLUS) liquid Take 30 mLs by mouth 2 (two) times daily between meals.   nystatin (MYCOSTATIN/NYSTOP) powder Apply topically 2 (two) times daily as needed (skin irritation (under breasts)).   ondansetron (ZOFRAN) 4 MG tablet Take 1 tablet (4 mg total) by mouth every 8 (eight) hours as needed.   oxyCODONE (ROXICODONE) 5 MG/5ML solution Take 4 mLs (4 mg total) by mouth every 4 (four)  hours as needed for moderate pain.   polyethylene glycol (MIRALAX / GLYCOLAX) packet Take 17 g by mouth daily as needed for mild constipation.   potassium chloride SA (KLOR-CON M) 20 MEQ tablet Take 1 tablet (20 mEq total) by mouth daily.   revefenacin (YUPELRI) 175 MCG/3ML nebulizer solution Take 3 mLs (175 mcg total) by nebulization daily.   sodium chloride (OCEAN) 0.65 % SOLN nasal spray Place 1 spray into both nostrils as needed for congestion (nose bleeds).   valACYclovir (VALTREX) 500 MG tablet Take 500 mg by mouth daily as needed (breakouts).    No facility-administered encounter medications  on file as of 10/23/2022.    Allergies (verified) Buprenorphine, Erythromycin, Iodinated contrast media, Latex, Other, Tape, Ciprofloxacin, Duloxetine hcl, Erythromycin base, Hydrocodone, Hydromorphone, Iodine, Levofloxacin, Metrizamide, Nsaids, Nucynta [tapentadol hcl], Oxycodone, Pentazocine, Septra [bactrim], Sulfasalazine, Morphine, Sulfa antibiotics, Sulfamethoxazole-trimethoprim, and Tapentadol   History: Past Medical History:  Diagnosis Date   Allergies    Anxiety    Arthritis    "maybe in my back" (03/31/2018)   Benign paroxysmal positional vertigo 06/08/2013   Complication of anesthesia    Fracture of multiple ribs 2015   "don't know from what; dx'd when I in hospital for 1st back OR" (03/31/2018)   GERD (gastroesophageal reflux disease)    Hair loss 04/12/2012   Herpes    History of blood transfusion    "twice; related to back OR" (03/31/2018)   History of kidney stones    Interstitial cystitis 11/06/2011   Melanoma of ankle ~ 2003   "right"   Mitral regurgitation    Osteopenia 02/18/2012   Osteoporosis    PAF (paroxysmal atrial fibrillation) 2012   Peripheral neuropathy 11/06/2011   PMDD (premenstrual dysphoric disorder)    PONV (postoperative nausea and vomiting)    nausea, vomiting, hives and dizziness    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/2020   Complete bilateral atrial lesion set using cryothermy and bipolar radiofrequency ablation with clipping of LA appendage via right mini-thoracotomy approach   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   Complex valvuloplasty including artificial Gore-tex neochord placement x12 with 32mm Sorin Memo 4D ring annuloplasty   Seasonal allergies    Vaginal delivery    ONE NSVD   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   Past Surgical History:  Procedure Laterality Date   ANTERIOR CERVICAL DECOMP/DISCECTOMY FUSION  ~ 2003   BACK SURGERY     BREAST SURGERY     BREAST BIOPSY--RIGHT BENIGN   BUNIONECTOMY Bilateral    CARDIOVERSION N/A  01/26/2020   Procedure: CARDIOVERSION;  Surgeon: Sande Rives, MD;  Location: [facility] ENDOSCOPY;  Service: Cardiovascular;  Laterality: N/A;   COSMETIC SURGERY  2016   "back of my neck; related to earlier fusion"   CYSTOSCOPY W/ STONE MANIPULATION  "several times"   DILATION AND CURETTAGE OF UTERUS     FOREHEAD RECONSTRUCTION Right    "removed bone protruding out of my forehead"   HARDWARE REMOVAL  2016   "related to neck OR"   INCONTINENCE SURGERY     IR THORACENTESIS ASP PLEURAL SPACE W/IMG GUIDE  02/16/2020   MINIMALLY INVASIVE MAZE PROCEDURE N/A 01/17/2020   Procedure: MINIMALLY INVASIVE MAZE PROCEDURE;  Surgeon: Purcell Nails, MD;  Location: [facility] OR;  Service: Open Heart Surgery;  Laterality: N/A;   MITRAL VALVE REPAIR Right 01/17/2020   Procedure: MINIMALLY INVASIVE MITRAL VALVE REPAIR (MVR) USING MEMO 4D ;  Surgeon: Purcell Nails, MD;  Location: [facility] OR;  Service: Open Heart Surgery;  Laterality: Right;   POSTERIOR CERVICAL FUSION/FORAMINOTOMY  ~ 2008; 2015   RIGHT/LEFT HEART CATH AND CORONARY ANGIOGRAPHY N/A 12/02/2019   Procedure: RIGHT/LEFT HEART CATH AND CORONARY ANGIOGRAPHY;  Surgeon: Kathleene Hazel, MD;  Location: [facility] INVASIVE CV LAB;  Service: Cardiovascular;  Laterality: N/A;   SHOULDER ARTHROSCOPY W/ ROTATOR CUFF REPAIR Right 2012   SPINAL FUSION  06/2014 - 2018 X ?7   "scoliosis; my entire back"   TEE WITHOUT CARDIOVERSION N/A 11/21/2019   Procedure: TRANSESOPHAGEAL ECHOCARDIOGRAM (TEE);  Surgeon: Wendall Stade, MD;  Location: [facility] ENDOSCOPY;  Service: Cardiovascular;  Laterality: N/A;   TEE WITHOUT CARDIOVERSION N/A 01/17/2020   Procedure: TRANSESOPHAGEAL ECHOCARDIOGRAM (TEE);  Surgeon: Purcell Nails, MD;  Location: [facility] OR;  Service: Open Heart Surgery;  Laterality: N/A;   TEE WITHOUT CARDIOVERSION N/A 01/26/2020   Procedure: TRANSESOPHAGEAL ECHOCARDIOGRAM (TEE);  Surgeon: Sande Rives, MD;  Location: [facility] ENDOSCOPY;  Service: Cardiovascular;   Laterality: N/A;   TUBAL LIGATION     VAGINAL HYSTERECTOMY     TVH   Family History  Problem Relation Age of Onset   Diabetes Father    Hyperlipidemia Sister    Heart disease Sister    Stroke Sister    Diabetes Brother    Hyperlipidemia Sister    Heart disease Sister    Arthritis Mother    Heart disease Mother    Uterine cancer Mother    Diabetes Brother    Heart Problems Brother    Social History   Socioeconomic History   Marital status: Divorced    Spouse name: none/divorced   Number of children: Not on file   Years of education: Not on file   Highest education level: 12th grade  Occupational History   Occupation: retired  Tobacco Use   Smoking status: Former    Packs/day: 0.10    Years: 14.00    Additional pack years: 0.00    Total pack years: 1.40    Types: Cigarettes    Quit date: 1980    Years since quitting: 44.3   Smokeless tobacco: Never   Tobacco comments:    03/31/2018 "quit ~ 1980; someday smoker when I did smoke; never addicted"  Vaping Use   Vaping Use: Never used  Substance and Sexual Activity   Alcohol use: Never   Drug use: Yes    Types: Oxycodone, Benzodiazepines    Comment: 03/31/2018 "for chronic neck and back pain", takes Klonopin at times.    Sexual activity: Not Currently  Other Topics Concern   Not on file  Social History Narrative   Lives by herself.    Social Determinants of Health   Financial Resource Strain: Low Risk  (10/23/2022)   Overall Financial Resource Strain (CARDIA)    Difficulty of Paying Living Expenses: Not hard at all  Food Insecurity: No Food Insecurity (10/23/2022)   Hunger Vital Sign    Worried About Running Out of Food in the Last Year: Never true    Ran Out of Food in the Last Year: Never true  Transportation Needs: No Transportation Needs (10/23/2022)   PRAPARE - Administrator, Civil Service (Medical): No    Lack of Transportation (Non-Medical): No  Physical Activity: Inactive (10/23/2022)    Exercise Vital Sign    Days of Exercise per Week: 0 days    Minutes of Exercise per Session: 0 min  Stress: Stress Concern Present (10/23/2022)   [employer] of Occupational Health - Occupational Stress Questionnaire  Feeling of Stress : To some extent  Social Connections: Socially Isolated (10/23/2022)   Social Connection and Isolation Panel [NHANES]    Frequency of Communication with Friends and Family: More than three times a week    Frequency of Social Gatherings with Friends and Family: Once a week    Attends Religious Services: Never    Database administrator or Organizations: No    Attends Banker Meetings: Never    Marital Status: Divorced    Tobacco Counseling Counseling given: Not Answered Tobacco comments: 03/31/2018 "quit ~ 1980; someday smoker when I did smoke; never addicted"   Clinical Intake:  Pre-visit preparation completed: Yes  Pain : 0-10 Pain Score: 6  Pain Type: Chronic pain Pain Location: Back (neck) Pain Descriptors / Indicators: Constant, Burning, Aching, Dull Pain Onset: More than a month ago Pain Frequency: Constant Pain Relieving Factors: ocycodone  every 4 hours  Pain Relieving Factors: ocycodone  every 4 hours  Diabetes: No  How often do you need to have someone help you when you read instructions, pamphlets, or other written materials from your doctor or pharmacy?: 1 - Never  Diabetic?  no     Information entered by :: Remi Haggard LPN   Activities of Daily Living    10/23/2022    1:12 PM  In your present state of health, do you have any difficulty performing the following activities:  Hearing? 1  Vision? 0  Difficulty concentrating or making decisions? 0  Walking or climbing stairs? 1  Dressing or bathing? 0  Doing errands, shopping? 0  Preparing Food and eating ? N  Using the Toilet? N  In the past six months, have you accidently leaked urine? N  Do you have problems with loss of bowel control? N  Managing  your Medications? N  Managing your Finances? N  Housekeeping or managing your Housekeeping? N    Patient Care Team: Sheliah Hatch, MD as PCP - General (Family Medicine) Regan Lemming, MD as PCP - Electrophysiology (Cardiology) Jens Som Madolyn Frieze, MD as PCP - Cardiology (Cardiology) Jamison Neighbor, MD as Consulting Physician (Urology) Sharrell Ku, MD as Consulting Physician (Gastroenterology) Diamantina Providence, MD as Referring Physician (Neurosurgery) Othella Boyer, MD as Consulting Physician (Cardiology) Burtis Junes, MD as Referring Physician (Pain Medicine) [facility], [place] Venancio Poisson, MD as Consulting Physician (Dermatology) Aletha Halim, MD as Consulting Physician (Specialist) Purcell Nails, MD (Inactive) as Consulting Physician (Cardiothoracic Surgery) Anson Fret, MD as Consulting Physician (Neurology) Tonye Becket, DDS as Consulting Physician (Dentistry) Anson Fret, MD as Consulting Physician (Neurology) Erroll Luna, [facility] as Pharmacist (Pharmacist) Erroll Luna, [facility] (Pharmacist) Genia Del, MD as Consulting Physician (Obstetrics and Gynecology)  Indicate any recent Medical Services you may have received from other than [facility] providers in the past year (date may be approximate).     Assessment:   This is a routine wellness examination for [place].  Hearing/Vision screen Hearing Screening - Comments:: Some hearing loss when has wax in ears  No hearing aids Vision Screening - Comments:: [facility] Mellow Up to date  Dietary issues and exercise activities discussed: Current Exercise Habits: The patient does not participate in regular exercise at present, Exercise limited by: orthopedic condition(s)   Goals Addressed             This Visit's Progress    Patient Stated       Stay independent       Depression Screen  10/23/2022    1:16 PM 08/20/2022    2:18 PM 07/14/2022    2:08 PM  05/02/2022    1:10 PM 02/06/2022    1:08 PM 10/31/2021    1:59 PM 10/10/2021    1:44 PM  PHQ 2/9 Scores  PHQ - 2 Score 0  PHQ- 9 Score Fall Risk    10/23/2022    1:30 PM 08/20/2022    2:18 PM 07/29/2022    2:05 PM 07/14/2022    2:08 PM 05/02/2022    1:10 PM  Fall Risk   Falls in the past year? 0 0 0 0 0  Number falls in past yr: 0 0 0 0   Injury with Fall? 0 0 0 0   Risk for fall due to : Impaired balance/gait No Fall Risks Orthopedic patient No Fall Risks No Fall Risks  Follow up Falls evaluation completed;Education provided;Falls prevention discussed Falls evaluation completed Falls evaluation completed Falls evaluation completed Falls evaluation completed    FALL RISK PREVENTION PERTAINING TO THE HOME:  Any stairs in or around the home? Yes   chair lift If so, are there any without handrails? No  Home free of loose throw rugs in walkways, pet beds, electrical cords, etc? Yes  Adequate lighting in your home to reduce risk of falls? Yes   ASSISTIVE DEVICES UTILIZED TO PREVENT FALLS:  Life alert? No  Use of a cane, walker or w/c? Yes  Grab bars in the bathroom? No  Shower chair or bench in shower? Yes  Elevated toilet seat or a handicapped toilet? Yes   TIMED UP AND GO:  Was the test performed? No .    Cognitive Function:        10/23/2022    1:13 PM  6CIT Screen  What Year? 0 points  What month? 0 points  What time? 0 points  Count back from 20 0 points  Months in reverse 0 points  Repeat phrase 0 points  Total Score 0 points    Immunizations Immunization History  Administered Date(s) Administered   DTaP 07/07/2009   Influenza Inj Mdck Quad Pf 03/25/2019   Influenza Split 03/07/2012   Influenza, High Dose Seasonal PF 05/02/2016   Influenza, Seasonal, Injecte, Preservative Fre 05/08/2018, 03/25/2019   Influenza,inj,Quad PF,6+ Mos 06/08/2013, 04/06/2015, 05/02/2017, 05/08/2018   Influenza-Unspecified 03/07/2012, 06/08/2013,  04/06/2015, 05/02/2016, 05/02/2017, 05/08/2018, 04/26/2021   PFIZER(Purple Top)SARS-COV-2 Vaccination 08/11/2019, 09/01/2019   Pneumococcal Conjugate-13 04/19/2015   Pneumococcal Polysaccharide-23 08/07/2009, 12/27/2013   Pneumococcal-Unspecified 04/19/2015   Tdap 08/02/2020   Zoster Recombinat (Shingrix) 07/08/2010   Zoster, Live 07/07/2010    TDAP status: Up to date  Flu Vaccine status: Due, Education has been provided regarding the importance of this vaccine. Advised may receive this vaccine at local pharmacy or Health Dept. Aware to provide a copy of the vaccination record if obtained from local pharmacy or Health Dept. Verbalized acceptance and understanding.  Pneumococcal vaccine status: Up to date  Covid-19 vaccine status: Information provided on how to obtain vaccines.   Qualifies for Shingles Vaccine? Yes   Zostavax completed Yes   Shingrix Completed?: No.    Education has been provided regarding the importance of this vaccine. Patient has been advised to call insurance company to determine out of pocket expense if they have not yet received this vaccine. Advised may also receive vaccine at local pharmacy or Health Dept. Verbalized acceptance and understanding.  Screening Tests Health Maintenance  Topic Date Due   MAMMOGRAM  12/31/2022   INFLUENZA VACCINE  02/05/2023   Medicare Annual Wellness (AWV)  10/23/2023   DTaP/Tdap/Td (3 - Td or Tdap) 08/02/2030   Pneumonia Vaccine 14+ Years old  Completed   DEXA SCAN  Completed   Hepatitis C Screening  Completed   HPV VACCINES  Aged Out   COLONOSCOPY (Pts 45-45yrs Insurance coverage will need to be confirmed)  Discontinued   COVID-19 Vaccine  Discontinued   Zoster Vaccines- Shingrix  Discontinued    Health Maintenance  There are no preventive care reminders to display for this patient.   Colorectal cancer screening: No longer required.   Mammogram status: Completed  . Repeat every year  Bone Density status: Completed  2024. Results reflect: Bone density results: OSTEOPOROSIS. Repeat every 2 years.  Lung Cancer Screening: (Low Dose CT Chest recommended if Age 69-80 years, 30 pack-year currently smoking OR have quit w/in 15years.) does not qualify.   Lung Cancer Screening Referral:   Additional Screening:  Hepatitis C Screening: does not qualify; Completed 2014  Vision Screening: Recommended annual ophthalmology exams for early detection of glaucoma and other disorders of the eye. Is the patient up to date with their annual eye exam?  Yes  Who is the provider or what is the name of the office in which the patient attends annual eye exams? [facility] If pt is not established with a provider, would they like to be referred to a provider to establish care? No .   Dental Screening: Recommended annual dental exams for proper oral hygiene  Community Resource Referral / Chronic Care Management: CRR required this visit?  No   CCM required this visit?  No      Plan:     I have personally reviewed and noted the following in the patient's chart:   Medical and social history Use of alcohol, tobacco or illicit drugs  Current medications and supplements including opioid prescriptions. Patient is currently taking opioid prescriptions. Information provided to patient regarding non-opioid alternatives. Patient advised to discuss non-opioid treatment plan with their provider. Functional ability and status Nutritional status Physical activity Advanced directives List of other physicians Hospitalizations, surgeries, and ER visits in previous 12 months Vitals Screenings to include cognitive, depression, and falls Referrals and appointments  In addition, I have reviewed and discussed with patient certain preventive protocols, quality metrics, and best practice recommendations. A written personalized care plan for preventive services as well as general preventive health recommendations were provided to patient.      Remi Haggard, LPN   1/61/0960   Nurse Notes:

## 2022-10-26 DIAGNOSIS — I499 Cardiac arrhythmia, unspecified: Secondary | ICD-10-CM | POA: Diagnosis not present

## 2022-10-26 DIAGNOSIS — I10 Essential (primary) hypertension: Secondary | ICD-10-CM | POA: Diagnosis not present

## 2022-10-27 ENCOUNTER — Encounter: Payer: Self-pay | Admitting: Family Medicine

## 2022-10-27 ENCOUNTER — Ambulatory Visit (INDEPENDENT_AMBULATORY_CARE_PROVIDER_SITE_OTHER): Payer: Medicare Other | Admitting: Family Medicine

## 2022-10-27 VITALS — BP 138/62 | HR 94 | Temp 98.4°F | Resp 17 | Ht 66.0 in | Wt 112.5 lb

## 2022-10-27 DIAGNOSIS — F32A Depression, unspecified: Secondary | ICD-10-CM

## 2022-10-27 DIAGNOSIS — I7300 Raynaud's syndrome without gangrene: Secondary | ICD-10-CM

## 2022-10-27 DIAGNOSIS — G500 Trigeminal neuralgia: Secondary | ICD-10-CM

## 2022-10-27 DIAGNOSIS — E785 Hyperlipidemia, unspecified: Secondary | ICD-10-CM | POA: Diagnosis not present

## 2022-10-27 DIAGNOSIS — F419 Anxiety disorder, unspecified: Secondary | ICD-10-CM | POA: Diagnosis not present

## 2022-10-27 DIAGNOSIS — G4734 Idiopathic sleep related nonobstructive alveolar hypoventilation: Secondary | ICD-10-CM | POA: Insufficient documentation

## 2022-10-27 DIAGNOSIS — G609 Hereditary and idiopathic neuropathy, unspecified: Secondary | ICD-10-CM

## 2022-10-27 MED ORDER — LEVOTHYROXINE SODIUM 75 MCG PO TABS: 75.0000 ug | ORAL_TABLET | Freq: Every day | ORAL | 0 refills | Status: DC

## 2022-10-27 NOTE — Patient Instructions (Addendum)
FoLanora Manisup as needed or as scheduled We'll call you to schedule your Neurology appt Call Dr DeWald's office and ask about a formal sleep study given the results of your overnight oximetry START the Gabapentin  nightly for pain and neuropathy TAKE the Clonazepam 1/2 tab twice daily every day to help better control anxiety Call with any questions or concerns Hang in there!!!

## 2022-10-27 NOTE — Progress Notes (Unsigned)
   Subjective:    Patient ID: [name], female    DOB: [birth year], 77 y.o.   MRN: [id number]  HPI ** pt was told at start of visit that we will not be able to cover all of her concerns today b/c it would take hours to do so.  She was asked to pick the 2 or 3 things that are most concerning**  'i don't think I'm going to be in this world much longer if I don't get better'- pt is concerned about pain (chest, jaw), elevated BP, swallowing, diabetes (she doesn't have), thyroid (which was normal 3 weeks ago).  Pt reports dizziness and nausea.  Some GI upset after eating.  'the neuropathy in my legs is so bad'.  Pt states she is not able to take Gabapentin b/c she staggers.  Pt is no longer seeing a counselor.  Pt feels she has no one to turn to, no one to help her.  Taking 1/2 tab of Clonazepam once daily- this improves her pain.  R 1st and middle finger will turn white and go numb each afternoon.   Review of Systems For ROS see HPI     Objective:   Physical Exam Vitals reviewed.  Constitutional:      General: She is not in acute distress.    Comments: frail  HENT:     Head: Normocephalic and atraumatic.  Eyes:     Extraocular Movements: Extraocular movements intact.  Cardiovascular:     Rate and Rhythm: Normal rate.  Pulmonary:     Effort: Pulmonary effort is normal.  Skin:    General: Skin is warm and dry.  Neurological:     General: No focal deficit present.     Mental Status: She is alert and oriented to person, place, and time.  Psychiatric:        Mood and Affect: Mood is anxious (visit was stream of consciousness and her thought process was often difficult to follow).           Assessment & Plan:

## 2022-10-28 ENCOUNTER — Telehealth: Payer: Self-pay | Admitting: Pulmonary Disease

## 2022-10-28 ENCOUNTER — Ambulatory Visit (HOSPITAL_COMMUNITY): Payer: Medicare Other

## 2022-10-28 DIAGNOSIS — I7300 Raynaud's syndrome without gangrene: Secondary | ICD-10-CM | POA: Insufficient documentation

## 2022-10-28 NOTE — Telephone Encounter (Signed)
[name] rep came by the office stating that the fax still was not going through. Tried to fax it from fax machine in the read room but after getting to page 17, received a message stating communication error.  Was provided an email by [facility] and scanned pt's OV notes and demo/insurance info to that email address and received a status of success. Per Glennon Mac, pt's info was received. Nothing further needed.

## 2022-10-28 NOTE — Assessment & Plan Note (Signed)
Ongoing issue.  Pt has been given Gabapentin in the past but reported that it made her feel 'drunk'.  I asked about starting Lyrica and pt stated she was fearful of the medication.  Tried to explain that this was similar in action to Gabapentin but often better tolerated but she did not want to consider it at this time.  Instead, wants to restart the Gabapentin  that she has at home.  Encouraged her to start at night to see if she can tolerate it and we can titrate the dose if needed.  Referral also placed back to neurology.

## 2022-10-28 NOTE — Assessment & Plan Note (Addendum)
Ongoing issue.  Again discussed that a lot of her physical symptoms are likely triggered by her very high levels of anxiety.  We have tried nearly all types of daily anxiety/depression medications but each time, pt states she is intolerant (and this is often after only 1 or 2 doses).  We have also discussed the importance of a counselor to help her process her anxiety and fears but she has not been able to stick with one or find one that she deems appropriate.  And while she does have multiple medical problems, she is seeing numerous specialists and has had multiple workups that have been unrevealing.  This further supports that a lot of her somatic complaints may be anxiety driven.  At this time, since she is intolerant to traditional anxiety and depression medications, I feel it is best to increase the frequency of her Alprazolam to try and better control her anxiety.  This goes against my better judgement given her age and use of narcotic pain medications but at this time, it seems the only way to proceed.  Pt expressed understanding and is in agreement w/ plan. Total time spent w/ pt, 43 minutes, >50% spent counseling

## 2022-10-28 NOTE — Assessment & Plan Note (Signed)
Pt has ongoing facial pain.  Encouraged her to discuss this w/ neurology at upcoming appt as she asked to be referred back to Dr Lucia Gaskins.

## 2022-10-28 NOTE — Assessment & Plan Note (Signed)
New.  Reviewed pt's overnight oximetry results and it seems they are suggestive of sleep apnea.  Pulm decided not to start O2 overnight due to pt's hx of nose bleeds.  Encouraged her to ask pulm for a formal sleep study to determine if she has sleep apnea and how to proceed.

## 2022-10-28 NOTE — Assessment & Plan Note (Signed)
New.  Reviewed that when fingers and toes turn white and go numb this is typically due to a benign vascular spasm known as Raynaud's.  Offered to start low dose Amlodipine to help prevent sxs but she was not interested in more medication at this time.  Encouraged her to keep her hands warm, particularly if changing settings- inside to outside, etc.

## 2022-10-28 NOTE — Telephone Encounter (Signed)
Betsey came in bc the pt has not rcvd the yuperli in a while due to her demographics and his clinical notes not being completely faxed over  Pls fax last Demographics ad Clinical notes over to [phone]

## 2022-10-28 NOTE — Telephone Encounter (Signed)
Tried faxing pt's OV notes and demo info to provided fax number but keep getting communication error response and faxes are not going through. Called and spoke with [facility] who is our main rep that we use with [employer] letting her know what has been happening. I have tried routing pt's OV notes and demo/insurance information via epic. Betsey stated she would call me back if this did not go through to see what they recommend for [place] to do.

## 2022-10-30 ENCOUNTER — Telehealth: Payer: Self-pay | Admitting: Pulmonary Disease

## 2022-10-30 ENCOUNTER — Ambulatory Visit (HOSPITAL_COMMUNITY): Payer: Medicare Other

## 2022-10-30 NOTE — Telephone Encounter (Signed)
Left message for patient to call back  

## 2022-10-30 NOTE — Telephone Encounter (Signed)
Pt. Went to PCP and there asking to get a HST do to the overnight OX test she just had please advise

## 2022-10-30 NOTE — Telephone Encounter (Signed)
Patient would like to discuss Yupelri solution. Patient phone number is [phone].

## 2022-11-02 NOTE — Progress Notes (Unsigned)
Cardiology Office Note:    Date:  11/03/2022   ID:  [name], [name] 07-21-1945, MRN [id number]  PCP:  Sheliah Hatch, MD   [facility] Providers Cardiologist:  Olga Millers, MD Electrophysiologist:  Will Jorja Loa, MD {  Referring MD: Sheliah Hatch, MD   Chief Complaint  Patient presents with   Follow-up   Leg Pain    History of Present Illness:    [name] is a 77 y.o. female with a hx of chronic dyspnea, PAF, previous MV repair, coronary artery disease.  Patient is followed by Dr. Jens Som and presents today for a 1 month follow-up appointment.  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  For further evaluation, patient underwent TEE on 11/21/2019 that showed severe mitral valve regurgitation with a flail segment of the anterior leaflet.  It was recommended that she undergo mitral valve repair.  Prior to procedure, patient had a right/left heart catheterization on 12/02/2019 that showed mild, nonobstructive CAD with 20% stenosis in the proximal RCA and distal LAD.  She was seen by Dr. Barry Dienes and underwent minimally invasive mitral valve repair, complete Maze procedure, and sternotomy with repair of left ventricular free wall laceration on 01/17/2020.  Her postop course was complicated by acute blood loss anemia requiring blood transfusions and A-fib with RVR for which she was placed on amiodarone and Lopressor and Tikosyn was discontinued.  Later, amiodarone was discontinued and her A-fib has been managed with Eliquis and metoprolol.  Per review of notes, it appears that patient has had chronic dyspnea.  Her most recent echocardiogram from 02/2022 showed EF 50-55%, moderately reduced RV systolic function.  There was interventricular septal flattening in systole consistent with RV pressure overload.   Findings were consistent with normal structure and function of mitral valve prosthesis.  Follow-up chest CT in 02/2022 revealed right middle lobe and left upper lobe nodularity suggestive of atypical infection, nodule in the left major fissure. She is being followed by pulmonology Dr. Francine Graven.   Patient was last seen by cardiology on 10/03/22. Before that appointment, patient had complained of worsening dyspnea on exertion and lower extremity edema. She had been instructed to increase her lasix to 80 mg daily for 3 days before resuming her usual dose of 40 mg daily. At her appointment, she did note improvement in her symptoms after increasing her lasix.   Today, patient has a variety of concerns. She had a chest CT in 02/2022 that showed interval development of a 6x4 mm subpleural nodule along the left major fissure. She is very concerned about this finding. She does follow with pulmonology.  She is also concerned that she has been having frequent and intense nosebleeds on Eliquis.  About 6 weeks ago, she had to go to the ER because of one of these nosebleeds.  In the winter she does have nosebleeds pretty frequently and has tried to manage with humidifiers and nasal moisturizers.  Since starting Eliquis, she has also noticed that her feet have been blue.  She has pain in her calfs and ankles that is significantly worse with walking.  Feels like burning, cramping.  She denies chest pain.  Since increasing her dose of Lasix, her breathing has been stable.  Past Medical History:  Diagnosis Date   Allergies    Anxiety    Arthritis    "maybe  in my back" (03/31/2018)   Benign paroxysmal positional vertigo 06/08/2013   Complication of anesthesia    Fracture of multiple ribs 2015   "don't know from what; dx'd when I in hospital for 1st back OR" (03/31/2018)   GERD (gastroesophageal reflux disease)    Hair loss 04/12/2012   Herpes    History of blood transfusion    "twice; related to back OR" (03/31/2018)    History of kidney stones    Interstitial cystitis 11/06/2011   Melanoma of ankle (HCC) ~ 2003   "right"   Mitral regurgitation    Osteopenia 02/18/2012   Osteoporosis    PAF (paroxysmal atrial fibrillation) (HCC) 2012   Peripheral neuropathy 11/06/2011   PMDD (premenstrual dysphoric disorder)    PONV (postoperative nausea and vomiting)    nausea, vomiting, hives and dizziness    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/2020   Complete bilateral atrial lesion set using cryothermy and bipolar radiofrequency ablation with clipping of LA appendage via right mini-thoracotomy approach   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   Complex valvuloplasty including artificial Gore-tex neochord placement x12 with 32mm Sorin Memo 4D ring annuloplasty   Seasonal allergies    Vaginal delivery    ONE NSVD   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    Past Surgical History:  Procedure Laterality Date   ANTERIOR CERVICAL DECOMP/DISCECTOMY FUSION  ~ 2003   BACK SURGERY     BREAST SURGERY     BREAST BIOPSY--RIGHT BENIGN   BUNIONECTOMY Bilateral    CARDIOVERSION N/A 01/26/2020   Procedure: CARDIOVERSION;  Surgeon: Sande Rives, MD;  Location: [facility] ENDOSCOPY;  Service: Cardiovascular;  Laterality: N/A;   COSMETIC SURGERY  2016   "back of my neck; related to earlier fusion"   CYSTOSCOPY W/ STONE MANIPULATION  "several times"   DILATION AND CURETTAGE OF UTERUS     FOREHEAD RECONSTRUCTION Right    "removed bone protruding out of my forehead"   HARDWARE REMOVAL  2016   "related to neck OR"   INCONTINENCE SURGERY     IR THORACENTESIS ASP PLEURAL SPACE W/IMG GUIDE  02/16/2020   MINIMALLY INVASIVE MAZE PROCEDURE N/A 01/17/2020   Procedure: MINIMALLY INVASIVE MAZE PROCEDURE;  Surgeon: Purcell Nails, MD;  Location: [facility] OR;  Service: Open Heart Surgery;  Laterality: N/A;   MITRAL VALVE REPAIR Right 01/17/2020   Procedure: MINIMALLY INVASIVE MITRAL VALVE REPAIR (MVR) USING MEMO 4D ;  Surgeon: Purcell Nails,  MD;  Location: [facility] OR;  Service: Open Heart Surgery;  Laterality: Right;   POSTERIOR CERVICAL FUSION/FORAMINOTOMY  ~ 2008; 2015   RIGHT/LEFT HEART CATH AND CORONARY ANGIOGRAPHY N/A 12/02/2019   Procedure: RIGHT/LEFT HEART CATH AND CORONARY ANGIOGRAPHY;  Surgeon: Kathleene Hazel, MD;  Location: [facility] INVASIVE CV LAB;  Service: Cardiovascular;  Laterality: N/A;   SHOULDER ARTHROSCOPY W/ ROTATOR CUFF REPAIR Right 2012   SPINAL FUSION  06/2014 - 2018 X ?7   "scoliosis; my entire back"   TEE WITHOUT CARDIOVERSION N/A 11/21/2019   Procedure: TRANSESOPHAGEAL ECHOCARDIOGRAM (TEE);  Surgeon: Wendall Stade, MD;  Location: [facility] ENDOSCOPY;  Service: Cardiovascular;  Laterality: N/A;   TEE WITHOUT CARDIOVERSION N/A 01/17/2020   Procedure: TRANSESOPHAGEAL ECHOCARDIOGRAM (TEE);  Surgeon: Purcell Nails, MD;  Location: [facility] OR;  Service: Open Heart Surgery;  Laterality: N/A;   TEE WITHOUT CARDIOVERSION N/A 01/26/2020   Procedure: TRANSESOPHAGEAL ECHOCARDIOGRAM (TEE);  Surgeon: Sande Rives, MD;  Location: [facility] ENDOSCOPY;  Service: Cardiovascular;  Laterality: N/A;   TUBAL LIGATION  VAGINAL HYSTERECTOMY     TVH    Current Medications: Current Meds  Medication Sig   acetaminophen (TYLENOL) 500 MG tablet Take 500 mg by mouth every 6 (six) hours as needed for moderate pain.    apixaban (ELIQUIS) 5 MG TABS tablet Take 1 tablet (5 mg total) by mouth 2 (two) times daily.   b complex vitamins tablet Take 1 tablet by mouth daily.   Calcium Carbonate Antacid (TUMS CHEWY BITES PO) Take 1 tablet by mouth daily as needed (reflux).    Calcium Citrate-Vitamin D (CALCIUM + D PO) Take 1 tablet by mouth daily.   Carboxymethylcellul-Glycerin (LUBRICATING EYE DROPS OP) Place 1 drop into both eyes daily as needed (dry eyes).   cephALEXin (KEFLEX) 500 MG capsule Take 2 grams (4 tablets) by mouth 1 hour prior to dental cleaning/procedure.   clonazePAM (KLONOPIN) 0.5 MG tablet Take 1 tablet by mouth twice daily as  needed for anxiety   denosumab (PROLIA) 60 MG/ML SOSY injection Inject 60 mg into the skin every 6 (six) months.   furosemide (LASIX) 40 MG tablet Take 1 tablet (40 MG) by mouth on Monday, Wednesday, Friday and Sunday.   furosemide (LASIX) 40 MG tablet Take 1.5 tablets (60 mg total) by mouth as directed. Take 1.5 tablet by mouth on Tuesday, Thursday, and Saturday.   levothyroxine (SYNTHROID) 75 MCG tablet Take 1 tablet (75 mcg total) by mouth daily.   meclizine (ANTIVERT) 25 MG tablet TAKE 1 TABLET BY MOUTH THREE TIMES DAILY AS NEEDED FOR DIZZINESS   metoprolol tartrate (LOPRESSOR) 25 MG tablet Take 1 tablet (25 mg total) by mouth 2 (two) times daily.   mupirocin ointment (BACTROBAN) 2 % Apply 1 Application topically daily.   Nutritional Supplements (,FEEDING SUPPLEMENT, PROSOURCE PLUS) liquid Take 30 mLs by mouth 2 (two) times daily between meals.   nystatin (MYCOSTATIN/NYSTOP) powder Apply topically 2 (two) times daily as needed (skin irritation (under breasts)).   ondansetron (ZOFRAN) 4 MG tablet Take 1 tablet (4 mg total) by mouth every 8 (eight) hours as needed.   oxyCODONE (ROXICODONE) 5 MG/5ML solution Take 4 mLs (4 mg total) by mouth every 4 (four) hours as needed for moderate pain.   polyethylene glycol (MIRALAX / GLYCOLAX) packet Take 17 g by mouth daily as needed for mild constipation.   potassium chloride SA (KLOR-CON M) 20 MEQ tablet Take 1 tablet (20 mEq total) by mouth daily.   revefenacin (YUPELRI) 175 MCG/3ML nebulizer solution Take 3 mLs (175 mcg total) by nebulization daily.   sodium chloride (OCEAN) 0.65 % SOLN nasal spray Place 1 spray into both nostrils as needed for congestion (nose bleeds).   valACYclovir (VALTREX) 500 MG tablet Take 500 mg by mouth daily as needed (breakouts).      Allergies:   Buprenorphine, Erythromycin, Iodinated contrast media, Latex, Other, Tape, Ciprofloxacin, Duloxetine hcl, Erythromycin base, Hydrocodone, Hydromorphone, Iodine, Levofloxacin,  Metrizamide, Nsaids, Nucynta [tapentadol hcl], Oxycodone, Pentazocine, Septra [bactrim], Sulfasalazine, Morphine, Sulfa antibiotics, Sulfamethoxazole-trimethoprim, and Tapentadol   Social History   Socioeconomic History   Marital status: Divorced    Spouse name: none/divorced   Number of children: Not on file   Years of education: Not on file   Highest education level: 12th grade  Occupational History   Occupation: retired  Tobacco Use   Smoking status: Former    Packs/day: 0.10    Years: 14.00    Additional pack years: 0.00    Total pack years: 1.40    Types: Cigarettes  Quit date: 81    Years since quitting: 44.3   Smokeless tobacco: Never   Tobacco comments:    03/31/2018 "quit ~ 1980; someday smoker when I did smoke; never addicted"  Vaping Use   Vaping Use: Never used  Substance and Sexual Activity   Alcohol use: Never   Drug use: Yes    Types: Oxycodone, Benzodiazepines    Comment: 03/31/2018 "for chronic neck and back pain", takes Klonopin at times.    Sexual activity: Not Currently  Other Topics Concern   Not on file  Social History Narrative   Lives by herself.    Social Determinants of Health   Financial Resource Strain: Low Risk  (10/23/2022)   Overall Financial Resource Strain (CARDIA)    Difficulty of Paying Living Expenses: Not hard at all  Food Insecurity: No Food Insecurity (10/23/2022)   Hunger Vital Sign    Worried About Running Out of Food in the Last Year: Never true    Ran Out of Food in the Last Year: Never true  Transportation Needs: No Transportation Needs (10/23/2022)   PRAPARE - Administrator, Civil Service (Medical): No    Lack of Transportation (Non-Medical): No  Physical Activity: Inactive (10/23/2022)   Exercise Vital Sign    Days of Exercise per Week: 0 days    Minutes of Exercise per Session: 0 min  Stress: Stress Concern Present (10/23/2022)   [employer] of Occupational Health - Occupational Stress Questionnaire     Feeling of Stress : To some extent  Social Connections: Socially Isolated (10/23/2022)   Social Connection and Isolation Panel [NHANES]    Frequency of Communication with Friends and Family: More than three times a week    Frequency of Social Gatherings with Friends and Family: Once a week    Attends Religious Services: Never    Database administrator or Organizations: No    Attends Engineer, structural: Never    Marital Status: Divorced     Family History: The patient's family history includes Arthritis in her mother; Diabetes in her brother, brother, and father; Heart Problems in her brother; Heart disease in her mother, sister, and sister; Hyperlipidemia in her sister and sister; Stroke in her sister; Uterine cancer in her mother.  ROS:   Please see the history of present illness.     All other systems reviewed and are negative.  EKGs/Labs/Other Studies Reviewed:    The following studies were reviewed today: Cardiac Studies & Procedures   CARDIAC CATHETERIZATION  CARDIAC CATHETERIZATION 12/02/2019  Narrative  Prox RCA lesion is 20% stenosed.  Dist LAD lesion is 20% stenosed.  1. Mild non-obstructive CAD  Recommendation: Continue planning for mitral valve repair.  Findings Coronary Findings Diagnostic  Dominance: Right  Left Anterior Descending Vessel is large. Dist LAD lesion is 20% stenosed.  Left Circumflex Vessel is small.  Right Coronary Artery Vessel is large. Prox RCA lesion is 20% stenosed.  Intervention  No interventions have been documented.     ECHOCARDIOGRAM  ECHOCARDIOGRAM COMPLETE 02/12/2022  Narrative ECHOCARDIOGRAM REPORT    Patient Name:   [name] Date of Exam: 02/11/2022 Medical Rec #:  [id number]   Height:       66.0 in Accession #:    [id number]  Weight:       115.2 lb Date of Birth:  25-Nov-1945   BSA:          1.582 m Patient Age:    54 years  BP:           120/70 mmHg Patient Gender: F           HR:           63  bpm. Exam Location:  Outpatient  Procedure: 2D Echo, 3D Echo, Cardiac Doppler, Color Doppler and Strain Analysis  MODIFIED REPORT: This report was modified by Weston Brass MD on 02/12/2022 due to revision. Indications:     S/P mitral valve repair  History:         Patient has prior history of Echocardiogram examinations, most recent 10/22/2020. Mitral Valve Disease, Arrythmias:Atrial Fibrillation; Risk Factors:Hypertension and Former Smoker. Status post MVR complicated with LV free wall laceration requiring median sternotomy. Status post Gore-tex neochord x 12. 32 mm sorin Memo 4D prosthetic annuloplasty ring present in the mitral position. Status post MAZE procedure.  Mitral Valve: 32 mm Sorin Memo 4D prosthetic annuloplasty ring valve is present in the mitral position. Procedure Date: 01/17/20.  Sonographer:     Jeryl Columbia RDCS Referring Phys:  [id number] Kathlynn Grate [place] Diagnosing Phys: Weston Brass MD   Sonographer Comments: Image acquisition challenging due to patient body habitus and Image acquisition challenging due to respiratory motion. IMPRESSIONS   1. Left ventricular ejection fraction, by estimation, is 50 to 55%. The left ventricle has low normal function. Left ventricular endocardial border not optimally defined to evaluate regional wall motion. Left ventricular diastolic function could not be evaluated. There is the interventricular septum is flattened in systole, consistent with right ventricular pressure overload. 2. Right ventricular systolic function is moderately reduced. The right ventricular size is mildly enlarged. There is mildly elevated pulmonary artery systolic pressure. The estimated right ventricular systolic pressure is 44.6 mmHg. 3. Right atrial size was mildly dilated. 4. The mitral valve has been repaired/replaced. Trivial mitral valve regurgitation. No evidence of mitral stenosis. The mean mitral valve gradient is 1.9 mmHg with average  heart rate of 59 bpm. There is a 32 mm Sorin Memo 4D prosthetic annuloplasty ring present in the mitral position. Procedure Date: 01/17/20. Echo findings are consistent with normal structure and function of the mitral valve prosthesis. 5. The aortic valve is tricuspid. There is mild calcification of the aortic valve. There is mild thickening of the aortic valve. Aortic valve regurgitation is mild. Aortic valve sclerosis is present, with no evidence of aortic valve stenosis. 6. The inferior vena cava is dilated in size with <50% respiratory variability, suggesting right atrial pressure of 15 mmHg.  FINDINGS Left Ventricle: Left ventricular ejection fraction, by estimation, is 50 to 55%. The left ventricle has low normal function. Left ventricular endocardial border not optimally defined to evaluate regional wall motion. Global longitudinal strain performed but not reported based on interpreter judgement due to suboptimal tracking. The left ventricular internal cavity size was normal in size. There is no left ventricular hypertrophy. Abnormal (paradoxical) septal motion consistent with post-operative status and the interventricular septum is flattened in systole, consistent with right ventricular pressure overload. Left ventricular diastolic function could not be evaluated due to mitral valve repair. Left ventricular diastolic function could not be evaluated.  Right Ventricle: The right ventricular size is mildly enlarged. No increase in right ventricular wall thickness. Right ventricular systolic function is moderately reduced. There is mildly elevated pulmonary artery systolic pressure. The tricuspid regurgitant velocity is 2.72 m/s, and with an assumed right atrial pressure of 15 mmHg, the estimated right ventricular systolic pressure is 44.6 mmHg.  Left Atrium: Left atrial size was  normal in size.  Right Atrium: Right atrial size was mildly dilated.  Pericardium: There is no evidence of  pericardial effusion.  Mitral Valve: The mitral valve has been repaired/replaced. Trivial mitral valve regurgitation. There is a 32 mm Sorin Memo 4D prosthetic annuloplasty ring present in the mitral position. Procedure Date: 01/17/20. Echo findings are consistent with normal structure and function of the mitral valve prosthesis. No evidence of mitral valve stenosis. The mean mitral valve gradient is 1.9 mmHg with average heart rate of 59 bpm.  Tricuspid Valve: The tricuspid valve is normal in structure. Tricuspid valve regurgitation is trivial. No evidence of tricuspid stenosis.  Aortic Valve: The aortic valve is tricuspid. There is mild calcification of the aortic valve. There is mild thickening of the aortic valve. Aortic valve regurgitation is mild. Aortic regurgitation PHT measures 624 msec. Aortic valve sclerosis is present, with no evidence of aortic valve stenosis.  Pulmonic Valve: The pulmonic valve was normal in structure. Pulmonic valve regurgitation is mild to moderate. No evidence of pulmonic stenosis.  Aorta: The aortic root is normal in size and structure.  Venous: The inferior vena cava is dilated in size with less than 50% respiratory variability, suggesting right atrial pressure of 15 mmHg.  IAS/Shunts: No atrial level shunt detected by color flow Doppler.   LEFT VENTRICLE PLAX 2D LVIDd:         3.54 cm   Diastology LVIDs:         2.12 cm   LV e' medial:    3.09 cm/s LV PW:         1.00 cm   LV E/e' medial:  40.1 LV IVS:        0.96 cm   LV e' lateral:   5.63 cm/s LVOT diam:     1.90 cm   LV E/e' lateral: 22.0 LV SV:         45 LV SV Index:   28 LVOT Area:     2.84 cm   RIGHT VENTRICLE RV Basal diam:  4.61 cm RV Mid diam:    3.43 cm TAPSE (M-mode): 0.9 cm  LEFT ATRIUM             Index        RIGHT ATRIUM           Index LA diam:        3.00 cm 1.90 cm/m   RA Area:     23.20 cm LA Vol (A2C):   28.6 ml 18.08 ml/m  RA Volume:   81.00 ml  51.20 ml/m LA Vol  (A4C):   27.5 ml 17.38 ml/m LA Biplane Vol: 28.9 ml 18.27 ml/m AORTIC VALVE LVOT Vmax:         72.20 cm/s LVOT Vmean:        48.800 cm/s LVOT VTI:          0.158 m AI PHT:            624 msec AR Vena Contracta: 0.37 cm  AORTA Ao Root diam: 2.80 cm Ao Asc diam:  3.00 cm  MITRAL VALVE                TRICUSPID VALVE MV Area (PHT): 3.37 cm     TR Peak grad:   29.6 mmHg MV Mean grad:  1.9 mmHg     TR Vmax:        272.00 cm/s MV Decel Time: 225 msec MV E velocity: 124.00 cm/s  SHUNTS MV  A velocity: 37.70 cm/s   Systemic VTI:  0.16 m MV E/A ratio:  3.29         Systemic Diam: 1.90 cm  Weston Brass MD Electronically signed by Weston Brass MD Signature Date/Time: 02/12/2022/11:50:21 AM    Final (Updated)   TEE  ECHO TEE 01/26/2020  Narrative TRANSESOPHOGEAL ECHO REPORT    Patient Name:   [name] Date of Exam: 01/26/2020 Medical Rec #:  [id number]   Height:       66.0 in Accession #:    [id number]  Weight:       136.7 lb Date of Birth:  [birth year]   BSA:          1.701 m Patient Age:    74 years    BP:           118/66 mmHg Patient Gender: F           HR:           123 bpm. Exam Location:  Inpatient  Procedure: TEE-Intraopertive and 3D Echo  Indications:     atrial fibrillation  History:         Patient has prior history of Echocardiogram examinations, most recent 11/21/2019. Arrythmias:Atrial Fibrillation.  Mitral Valve: 32 mm prosthetic annuloplasty ring valve is present in the mitral position. Procedure Date: 01/17/2020.  Sonographer:     Delcie Roch Referring Phys:  [id number] Graciella Freer Diagnosing Phys: Lennie Odor MD  PROCEDURE: After discussion of the risks and benefits of a TEE, an informed consent was obtained from the patient. TEE procedure time was 20 minutes. The transesophogeal probe was passed without difficulty through the esophogus of the patient. Imaged were obtained with the patient in a left lateral decubitus position. Local  oropharyngeal anesthetic was provided with Cetacaine. Sedation performed by different physician. The patient was monitored while under deep sedation. Anesthestetic sedation was provided intravenously by Anesthesiology: 270mg  of Propofol. Image quality was excellent. The patient's vital signs; including heart rate, blood pressure, and oxygen saturation; remained stable throughout the procedure. The patient developed no complications during the procedure. A successful direct current cardioversion was performed at 200 joules with 1 attempt.  IMPRESSIONS   1. Left ventricular ejection fraction, by estimation, is 55 to 60%. The left ventricle has normal function. The left ventricle has no regional wall motion abnormalities. 2. Right ventricular systolic function is normal. The right ventricular size is normal. 3. S/p surgical LAA clipping. No residual connection noted with the LA. No LA thrombus. No left atrial/left atrial appendage thrombus was detected. 4. 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 No residual MR. MVA by direct 3D MPR assessment 2.6 cm2. The mitral valve has been repaired/replaced. No evidence of mitral valve regurgitation. No evidence of mitral stenosis. The mean mitral valve gradient is 4.0 mmHg with average heart rate of 111 bpm. There is a 32 mm prosthetic annuloplasty ring present in the mitral position. Procedure Date: 01/17/2020. 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 6. The aortic valve is tricuspid. Aortic valve regurgitation is not visualized. No aortic stenosis is present.  Conclusion(s)/Recommendation(s): No LA/LAA thrombus identified. Successful cardioversion performed with restoration of normal sinus rhythm.  FINDINGS Left Ventricle: Left ventricular ejection fraction, by estimation, is 55 to 60%. The left ventricle has normal function. The left ventricle has no regional wall motion abnormalities. The left ventricular internal cavity size was normal in size. There is no  left ventricular hypertrophy.  Right Ventricle: The right ventricular size  is normal. No increase in right ventricular wall thickness. Right ventricular systolic function is normal.  Left Atrium: S/p surgical LAA clipping. No residual connection noted with the LA. No LA thrombus. Left atrial size was normal in size. No left atrial/left atrial appendage thrombus was detected.  Right Atrium: Right atrial size was normal in size.  Pericardium: Trivial pericardial effusion is present.  Mitral Valve: S/p MV repair with 32 mm annuloplasty ring. No residual MR. MVA by direct 3D MPR assessment 2.6 cm2. The mitral valve has been repaired/replaced. No evidence of mitral valve regurgitation. There is a 32 mm prosthetic annuloplasty ring present in the mitral position. Procedure Date: 01/17/2020. No evidence of mitral valve stenosis. The mean mitral valve gradient is 4.0 mmHg with average heart rate of 111 bpm.  Tricuspid Valve: The tricuspid valve is myxomatous. Tricuspid valve regurgitation is mild . No evidence of tricuspid stenosis.  Aortic Valve: The aortic valve is tricuspid. Aortic valve regurgitation is not visualized. No aortic stenosis is present.  Pulmonic Valve: The pulmonic valve was grossly normal. Pulmonic valve regurgitation is mild. No evidence of pulmonic stenosis.  Aorta: The aortic root, ascending aorta, aortic arch and descending aorta are all structurally normal, with no evidence of dilitation or obstruction.  Venous: The left upper pulmonary vein, left lower pulmonary vein and right upper pulmonary vein are normal.  IAS/Shunts: There is redundancy of the interatrial septum. No atrial level shunt detected by color flow Doppler.  Additional Comments: There is a small pleural effusion in the left lateral region.    AORTA Ao Root diam: 3.20 cm Ao Asc diam:  2.80 cm  MITRAL VALVE           TRICUSPID VALVE MV Mean grad: 4.0 mmHg TR Peak grad:   25.0 mmHg TR Vmax:        250.00  cm/s  Lennie Odor MD Electronically signed by Lennie Odor MD Signature Date/Time: 01/26/2020/11:22:07 AM    Final   MONITORS  LONG TERM MONITOR (3-14 DAYS) 06/06/2019  Narrative Max 226 bpm 06:22pm, 11/15 Min 54 bpm 12:59am, 11/15 Avg 76 bpm Sinus rhythm with occasional runs of SVT, longest 15 seconds at 123 bmp Rare PVCs, 2.2% PACs Zero atrial fibrillation noted Patient trigger associated with sinus rhythm, rate 87  Will Camnitz, MD             Recent Labs: 06/16/2022: NT-Pro BNP 1,121 10/03/2022: ALT 18; BUN 21; Creatinine, Ser 0.76; Hemoglobin 13.0; Platelets 173; Potassium 4.6; Sodium 142; TSH 0.752  Recent Lipid Panel    Component Value Date/Time   CHOL 175 12/10/2020 1338   TRIG 79.0 12/10/2020 1338   HDL 80.40 12/10/2020 1338   CHOLHDL 2 12/10/2020 1338   VLDL 15.8 12/10/2020 1338   LDLCALC 79 12/10/2020 1338   LDLDIRECT 136.5 06/08/2013 1049     Risk Assessment/Calculations:                Physical Exam:    VS:  BP 110/64 (BP Location: Left Arm, Patient Position: Sitting, Cuff Size: Normal)   Pulse 67   Ht 5\' 6"  (1.676 m)   Wt 112 lb 6.4 oz (51 kg)   SpO2 91%   BMI 18.14 kg/m     Wt Readings from Last 3 Encounters:  11/03/22 112 lb 6.4 oz (51 kg)  10/27/22 112 lb 8 oz (51 kg)  10/03/22 112 lb 3.2 oz (50.9 kg)     GEN: Thin, elderly female. well developed. Sitting comfortably in the  chair in no acute distress HEENT: Normal NECK: No JVD; No carotid bruits CARDIAC: RRR, no murmurs, rubs, gallops. Radial pulses 2+ bilaterally. Dorsalis pedis pulses 1+ bilaterally   RESPIRATORY:  Clear to auscultation without rales, wheezing or rhonchi. Normal work of breathing on room air  ABDOMEN: Soft, non-tender, non-distended MUSCULOSKELETAL:  No edema in BLE; No deformity  SKIN: Warm and dry. Bilateral feet have a blue hue  NEUROLOGIC:  Alert and oriented x 3 PSYCHIATRIC:  Normal affect   ASSESSMENT:    1. Chronic diastolic heart failure  (HCC)   2. Claudication of both lower extremities (HCC)   3. Essential hypertension   4. S/P mitral valve repair   5. Paroxysmal atrial fibrillation (HCC)    PLAN:    In order of problems listed above:  Chronic HFpEF  Chronic Dyspnea  - Most recent echocardiogram from 02/2022 could not quantify diastolic dysfunction. EF was 50-55%, and there was RV septal falttening in systole consistent with RV pressure overload  - She has been on lasix 40 mg and 60 mg alternating every other day  - She is euvolemic on exam today. Breathing and ankle edema have been stable. Continue lasix at current dose  - Check BMP to assess renal function and electrolytes on lasix  - In the past, patient has refused spironolactone and SGLT2i   Claudication  - Patient complained of having pain in both calves that is worse when walking. Feels like cramping/burning. Also has noticed a blue hue to her feet and her feet are usually cold  - Ordered ABIs to rule out PAD   HTN  - BP well controlled on current medications (110/64 today in office)   S/p mitral valve repair  - Echocardiogram from 02/2022 was consistent with normal structure and function of mitral valve prosthesis  - Continue SBE prophylaxis   Paroxysmal Atrial Fibrillation  - She has regular heart rate and rhythm on exam today, suggestive of normal sinus rhythm  - Continue metoprolol tartrate 25 mg BID  - She is currently on eliquis 5 mg BID. She has been having frequent nosebleeds, some of which required ED visits to resolve. She is 77 years old and weights 51 kg. Will ask Dr. Jens Som (primary cardiologist) if we can reduce eliquis to 2.5 mg BID  - CBC from 10/03/22 was grossly normal     Cardiac Rehabilitation Eligibility Assessment  The patient has declined or is not appropriate for cardiac rehabilitation.      Medication Adjustments/Labs and Tests Ordered: Current medicines are reviewed at length with the patient today.  Concerns regarding  medicines are outlined above.  No orders of the defined types were placed in this encounter.  No orders of the defined types were placed in this encounter.   There are no Patient Instructions on file for this visit.   Signed, Jonita Albee, PA-C  11/03/2022 4:39 PM    [facility]

## 2022-11-03 ENCOUNTER — Ambulatory Visit: Payer: Medicare Other | Attending: General Practice | Admitting: Cardiology

## 2022-11-03 ENCOUNTER — Encounter: Payer: Self-pay | Admitting: General Practice

## 2022-11-03 VITALS — BP 110/64 | HR 67 | Ht 66.0 in | Wt 112.4 lb

## 2022-11-03 DIAGNOSIS — I739 Peripheral vascular disease, unspecified: Secondary | ICD-10-CM | POA: Diagnosis not present

## 2022-11-03 DIAGNOSIS — I10 Essential (primary) hypertension: Secondary | ICD-10-CM

## 2022-11-03 DIAGNOSIS — Z9889 Other specified postprocedural states: Secondary | ICD-10-CM | POA: Diagnosis not present

## 2022-11-03 DIAGNOSIS — I5032 Chronic diastolic (congestive) heart failure: Secondary | ICD-10-CM | POA: Diagnosis not present

## 2022-11-03 DIAGNOSIS — I480 Paroxysmal atrial fibrillation: Secondary | ICD-10-CM | POA: Diagnosis not present

## 2022-11-03 NOTE — Patient Instructions (Addendum)
Medication Instructions:  No Changes *If you need a refill on your cardiac medications before your next appointment, please call your pharmacy*  Lab Work: Your physician recommends that you return for lab work in: Within One Week: BMP  If you have labs (blood work) drawn today and your tests are completely normal, you will receive your results only by: MyChart Message (if you have MyChart) OR A paper copy in the mail If you have any lab test that is abnormal or we need to change your treatment, we will call you to review the results.  Testing/Procedures: Your physician has requested that you have an ankle brachial index (ABI). During this test an ultrasound and blood pressure cuff are used to evaluate the arteries that supply the arms and legs with blood. Allow thirty minutes for this exam. There are no restrictions or special instructions.    Follow-Up: At [facility], you and your health needs are our priority.  As part of our continuing mission to provide you with exceptional heart care, we have created designated Provider Care Teams.  These Care Teams include your primary Cardiologist (physician) and Advanced Practice Providers (APPs -  Physician Assistants and Nurse Practitioners) who all work together to provide you with the care you need, when you need it.  Your next appointment:   3 month(s)  Provider:   Robet Leu, PA-C

## 2022-11-04 ENCOUNTER — Ambulatory Visit (HOSPITAL_COMMUNITY): Payer: Medicare Other

## 2022-11-04 DIAGNOSIS — I499 Cardiac arrhythmia, unspecified: Secondary | ICD-10-CM | POA: Diagnosis not present

## 2022-11-04 DIAGNOSIS — I480 Paroxysmal atrial fibrillation: Secondary | ICD-10-CM | POA: Diagnosis not present

## 2022-11-04 DIAGNOSIS — Z9889 Other specified postprocedural states: Secondary | ICD-10-CM | POA: Diagnosis not present

## 2022-11-04 DIAGNOSIS — I739 Peripheral vascular disease, unspecified: Secondary | ICD-10-CM | POA: Diagnosis not present

## 2022-11-04 DIAGNOSIS — I10 Essential (primary) hypertension: Secondary | ICD-10-CM | POA: Diagnosis not present

## 2022-11-04 DIAGNOSIS — I5032 Chronic diastolic (congestive) heart failure: Secondary | ICD-10-CM | POA: Diagnosis not present

## 2022-11-05 LAB — BASIC METABOLIC PANEL
BUN/Creatinine Ratio: 26 (ref 12–28)
BUN: 22 mg/dL (ref 8–27)
CO2: 32 mmol/L — ABNORMAL HIGH (ref 20–29)
Calcium: 9.8 mg/dL (ref 8.7–10.3)
Chloride: 98 mmol/L (ref 96–106)
Creatinine, Ser: 0.85 mg/dL (ref 0.57–1.00)
Glucose: 92 mg/dL (ref 70–99)
Potassium: 4.7 mmol/L (ref 3.5–5.2)
Sodium: 142 mmol/L (ref 134–144)
eGFR: 70 mL/min/{1.73_m2} (ref >59)

## 2022-11-06 ENCOUNTER — Ambulatory Visit (HOSPITAL_COMMUNITY): Payer: Medicare Other

## 2022-11-06 ENCOUNTER — Telehealth: Payer: Self-pay | Admitting: Cardiology

## 2022-11-06 NOTE — Telephone Encounter (Signed)
Returned call to patient-discussed LEA test.  Patient has no further questions.

## 2022-11-06 NOTE — Telephone Encounter (Signed)
Pt would like to speak to nurse with some questions/concerns she has regarding her le arterial test next week. Please advise.

## 2022-11-10 DIAGNOSIS — Z79891 Long term (current) use of opiate analgesic: Secondary | ICD-10-CM | POA: Diagnosis not present

## 2022-11-10 DIAGNOSIS — G894 Chronic pain syndrome: Secondary | ICD-10-CM | POA: Diagnosis not present

## 2022-11-10 DIAGNOSIS — M961 Postlaminectomy syndrome, not elsewhere classified: Secondary | ICD-10-CM | POA: Diagnosis not present

## 2022-11-10 DIAGNOSIS — M5412 Radiculopathy, cervical region: Secondary | ICD-10-CM | POA: Diagnosis not present

## 2022-11-10 DIAGNOSIS — M4693 Unspecified inflammatory spondylopathy, cervicothoracic region: Secondary | ICD-10-CM | POA: Diagnosis not present

## 2022-11-11 ENCOUNTER — Ambulatory Visit (HOSPITAL_COMMUNITY): Payer: Medicare Other

## 2022-11-11 ENCOUNTER — Telehealth: Payer: Self-pay

## 2022-11-11 NOTE — Telephone Encounter (Signed)
Per Robet Leu, called to arrange Watchman consult with Dr. Excell Seltzer or Dr. Lalla Brothers. Spoke with the patient at length about the procedure. She declined to schedule at this time as she would like to think about it. Gave her my direct phone number to call should she change her mind. She was grateful for call.

## 2022-11-12 ENCOUNTER — Telehealth: Payer: Self-pay | Admitting: Cardiology

## 2022-11-12 NOTE — Telephone Encounter (Signed)
Patient is requesting to speak with PA Robet Leu. Patient stated she had questions with her upcoming test that is scheduled for 05/13.

## 2022-11-12 NOTE — Telephone Encounter (Signed)
Patient checking on message about medication. Patient phone number is [phone].

## 2022-11-12 NOTE — Telephone Encounter (Signed)
Left voicemail for patient to return call to office. 

## 2022-11-12 NOTE — Telephone Encounter (Signed)
Pt was prescribed a new medication by Dr Francine Graven- a nebulizer that is less expensive than the one she was using.  The pharmacy just got it ready and just needs verification from Pulmonologist and Cardiologist that this is ok to use/and will not cause any nose bleeds.  The patient does not even know what the name of the medication is. I told her to contact her Pulmonologist to find out the name of the drug. Also advised her to tell the pharmacy that they can contact our office if they need information as well.   Pt will call back if needed

## 2022-11-13 ENCOUNTER — Ambulatory Visit (HOSPITAL_COMMUNITY): Payer: Medicare Other

## 2022-11-13 DIAGNOSIS — Z85820 Personal history of malignant melanoma of skin: Secondary | ICD-10-CM | POA: Diagnosis not present

## 2022-11-13 DIAGNOSIS — L820 Inflamed seborrheic keratosis: Secondary | ICD-10-CM | POA: Diagnosis not present

## 2022-11-13 NOTE — Telephone Encounter (Signed)
Called patient.  Patient is concerned if [name] is safe for her to take in regards to her cardiology status and her hx of blood thinners and severe nose bleeds.   Called [facility].  [name] is now covered at 100% by patients insurance.  Patient was notified and spoke with their pharmacist and expressed her concerns.  Patient asked them to hold off on shipping the Yupelri until she could speak with Dr. Francine Graven.   [facility] notified us they can ship the [place] out immediately to the patient once we give them authorization. Dr Francine Graven:  please advise.

## 2022-11-13 NOTE — Telephone Encounter (Signed)
It is safe for patient to use the Yupelri with her blood thinners and nose bleeds. We discussed this in person at our last visit.  Thanks, JD

## 2022-11-14 DIAGNOSIS — K219 Gastro-esophageal reflux disease without esophagitis: Secondary | ICD-10-CM | POA: Diagnosis not present

## 2022-11-14 DIAGNOSIS — J300 Vasomotor rhinitis: Secondary | ICD-10-CM | POA: Diagnosis not present

## 2022-11-14 DIAGNOSIS — R040 Epistaxis: Secondary | ICD-10-CM | POA: Diagnosis not present

## 2022-11-14 DIAGNOSIS — H938X3 Other specified disorders of ear, bilateral: Secondary | ICD-10-CM | POA: Diagnosis not present

## 2022-11-14 NOTE — Telephone Encounter (Signed)
Called patient but she did not answer. Left message for patient to call back.  

## 2022-11-14 NOTE — Telephone Encounter (Signed)
Please call back she will hang onto her phone

## 2022-11-17 ENCOUNTER — Other Ambulatory Visit: Payer: Self-pay

## 2022-11-17 ENCOUNTER — Ambulatory Visit (HOSPITAL_COMMUNITY)
Admission: RE | Admit: 2022-11-17 | Discharge: 2022-11-17 | Disposition: A | Discharge status: Home / Self Care | Payer: Medicare Other | Source: Ambulatory Visit | Attending: Internal Medicine | Admitting: Internal Medicine

## 2022-11-17 DIAGNOSIS — I480 Paroxysmal atrial fibrillation: Secondary | ICD-10-CM

## 2022-11-17 DIAGNOSIS — I5032 Chronic diastolic (congestive) heart failure: Secondary | ICD-10-CM

## 2022-11-17 DIAGNOSIS — Z9889 Other specified postprocedural states: Secondary | ICD-10-CM | POA: Diagnosis not present

## 2022-11-17 DIAGNOSIS — I739 Peripheral vascular disease, unspecified: Secondary | ICD-10-CM

## 2022-11-17 DIAGNOSIS — I10 Essential (primary) hypertension: Secondary | ICD-10-CM

## 2022-11-17 DIAGNOSIS — J479 Bronchiectasis, uncomplicated: Secondary | ICD-10-CM

## 2022-11-17 MED ORDER — YUPELRI 175 MCG/3ML IN SOLN
175.0000 ug | Freq: Every day | RESPIRATORY_TRACT | 11 refills | Status: DC
Start: 2022-11-17 — End: 2023-11-18

## 2022-11-17 NOTE — Telephone Encounter (Signed)
Pt returning missed call. 

## 2022-11-17 NOTE — Telephone Encounter (Signed)
Spoke with patient. She is very confused in regards to nebulizer medication. I went over AVS and recc from Dr. Francine Graven in regards to [place]. Still wasn't understanding. Pt has requested apt with Dr. Francine Graven. I have her scheduled for my chart. NFN

## 2022-11-18 ENCOUNTER — Telehealth: Payer: Self-pay | Admitting: Cardiology

## 2022-11-18 ENCOUNTER — Telehealth: Payer: Self-pay | Admitting: Pulmonary Disease

## 2022-11-18 DIAGNOSIS — J479 Bronchiectasis, uncomplicated: Secondary | ICD-10-CM

## 2022-11-18 LAB — VAS US ABI WITH/WO TBI
Left ABI: 1.18
Right ABI: 1.11

## 2022-11-18 NOTE — Telephone Encounter (Signed)
Spoke with patient. She is wanting to talk to Cardiologist and Primary Care before taking Yupelri. She will call back once speaking with them before starting medication. Leaving encounter open for now

## 2022-11-18 NOTE — Telephone Encounter (Signed)
No, will not increase risk of nose bleeds or bleeding. Ok to take.

## 2022-11-18 NOTE — Telephone Encounter (Signed)
Pt c/o medication issue:  1. Name of Medication:   revefenacin (YUPELRI) 175 MCG/3ML nebulizer solution    2. How are you currently taking this medication (dosage and times per day)? Take 3 mLs (175 mcg total) by nebulization daily.   3. Are you having a reaction (difficulty breathing--STAT)? No  4. What is your medication issue?  Pt would like to make sure that it is okay for her to use above medication being that she is on a blood thinner. Wants to make sue that it will not cause nose bleeds. Please advise

## 2022-11-18 NOTE — Telephone Encounter (Signed)
Patient concern that she has a new nebulizer medication.  She is concerned and wants to make sure this medication is OK to use while on blood thinners.  Advised patient would see if OK to take while  on her other meds.   Gave instruction if any Nose bleeds lasting longer than 10 minutes to please contact doctor.  If nose bleed to pinch bridge of nose, do not lean back head. Also use ice to help slow Bleeding.   She states pharmacy she received medication from discussed the same issue of possible  nose -bleeds, coughing, and breathing issues.

## 2022-11-18 NOTE — Telephone Encounter (Signed)
Patient is aware that it will not increase your nose bleeds. She verbalized understanding

## 2022-11-18 NOTE — Telephone Encounter (Signed)
Patient would like the nurse to call regarding her medication.  Please call patient to discuss because she wants the doctor's approval to take this medication.  CB# [phone]

## 2022-11-20 ENCOUNTER — Telehealth: Payer: Self-pay | Admitting: Cardiology

## 2022-11-20 ENCOUNTER — Telehealth: Payer: Self-pay

## 2022-11-20 MED ORDER — PANTOPRAZOLE SODIUM 40 MG PO TBEC: 40.0000 mg | DELAYED_RELEASE_TABLET | Freq: Every day | ORAL | 5 refills | Status: DC

## 2022-11-20 NOTE — Telephone Encounter (Signed)
At this point, if she is still having foot/ankle pain, the best option would be to see a podiatrist

## 2022-11-20 NOTE — Telephone Encounter (Signed)
Pt c/o medication issue:  1. Name of Medication:   pantoprazole (PROTONIX) 40 MG tablet   2. How are you currently taking this medication (dosage and times per day)?   3. Are you having a reaction (difficulty breathing--STAT)?   4. What is your medication issue?   Patient wants a call back to confirm this medication is OK to take with her current medications. Patient stated she took this medication in the past and will be starting this medication again.

## 2022-11-20 NOTE — Telephone Encounter (Signed)
What do you recommend we do now ?

## 2022-11-20 NOTE — Telephone Encounter (Signed)
Pt notes pulmonary is asking her to start a reflux medication pantoprazole and wants you to verify that it is okay for her to take this med with her heart condition

## 2022-11-20 NOTE — Telephone Encounter (Signed)
Left message for patient with Dr Creshaw's recommendations.   

## 2022-11-20 NOTE — Telephone Encounter (Signed)
Patient is calling to follow up on her [place] ABI test results.

## 2022-11-20 NOTE — Telephone Encounter (Signed)
Pt notes referrals are falling through, notes neurologist stated she would not see the patient  noting she can't help the patient, asking for a recommendation now that she has tried that avenue.   Did see cardiology for testing on lower extremities and they also deferred to PCP stating they could not help her either   What are next steps given continued swelling and pain should patient try a different neurologist

## 2022-11-20 NOTE — Telephone Encounter (Signed)
Can we place that referral now or would you like to see her again?

## 2022-11-20 NOTE — Telephone Encounter (Signed)
Returned call to patient and discussed results of arterial US/ABIs,  Per Robet Leu, PA-C: Please tell patient that her lower extremity doppler study showed that her bilateral ABIs were within normal limits. This means that her lower extremities and feet are getting the blood flow that they need. Her ankle and leg pain are not caused by poor blood flow. If she is still having leg/ankle pain, I recommend that she see her primary care provider for further evaluation   Had lengthy discussion with patient, she was in tears explaining how she has so many issues and no one seems to be able to get to the bottom of what's going on with her.   She reports the same issues that have been discussed in previous phone notes regarding swelling in ankles, pain in legs/ankles when walking. She states her left ankle turns in towards her leg (reported this in April as well). She states she is compliant with her Lasix but swelling does not seem to be improving.  Patient repeatedly asked "what do I do?" She tearfully explained how she does not leave her house because her legs hurt her, she reports her quality of life has declined.  Advised patient to reach back out to Dr. Beverely Low (PCP) to discuss as recommended by Robet Leu, PA-C.  Patient has F/U appt with Nicholos Johns on 01/23/23.  Patient asked if Dr. Jens Som had any other recommendations for her, if there may be more tests that can be done or if he knew what her next step should be to resolve these issues.  Will forward to Dr. Jens Som to review and advise.

## 2022-11-20 NOTE — Telephone Encounter (Signed)
I think pantoprazole would be a great idea

## 2022-11-20 NOTE — Telephone Encounter (Signed)
Pt is asking for a referral to Podiatrist ? Is it ok to place referral

## 2022-11-20 NOTE — Telephone Encounter (Signed)
Pt called the office back. She said she spoke with the company of the [employer] and they told her that the medication is ready to be sent to her. Pt said she was told by Dr. Francine Graven that there should be no interaction with this medication with any of her other medications.   Pt said that the company told her that they are going to be shipping the Yupelri medication out and it will be no charge for her and if she did start having any problems with it that she could stop taking it and then return the medication to the company.  Told pt if she did start having any problems with the solution to stop taking it and she verbalized understanding. Pt said she is still waiting for a call from her PCP and cardiologist to get the final okay from them to make sure it would be okay for her to take the Yupelri solution along with her other medications. Pt said after she heard from them she would call us back to let us know if she was officially going to begin taking the solution.  Will still leave encounter open in case pt does call back.

## 2022-11-20 NOTE — Telephone Encounter (Signed)
Spoke to the pt and advised that per Dr Beverely Low the Pantoprazole is a good idea and DR Francine Graven sent that Rx in today for pt . Message sent to place referral to podiatrist

## 2022-11-21 ENCOUNTER — Other Ambulatory Visit: Payer: Self-pay

## 2022-11-21 DIAGNOSIS — M7989 Other specified soft tissue disorders: Secondary | ICD-10-CM

## 2022-11-21 NOTE — Telephone Encounter (Signed)
Referral was placed 

## 2022-11-21 NOTE — Telephone Encounter (Signed)
Ok for podiatry referral- dx foot and ankle pain

## 2022-11-24 ENCOUNTER — Other Ambulatory Visit: Payer: Self-pay | Admitting: Family Medicine

## 2022-11-24 NOTE — Telephone Encounter (Signed)
Pt aware of lab results 

## 2022-11-24 NOTE — Telephone Encounter (Signed)
Klonopin 0.5 mg LOV: 10/27/22 Last Refill:06/11/22 Upcoming appt: none

## 2022-11-25 DIAGNOSIS — I10 Essential (primary) hypertension: Secondary | ICD-10-CM | POA: Diagnosis not present

## 2022-11-25 DIAGNOSIS — I499 Cardiac arrhythmia, unspecified: Secondary | ICD-10-CM | POA: Diagnosis not present

## 2022-11-26 DIAGNOSIS — Z981 Arthrodesis status: Secondary | ICD-10-CM | POA: Diagnosis not present

## 2022-11-26 DIAGNOSIS — M810 Age-related osteoporosis without current pathological fracture: Secondary | ICD-10-CM | POA: Diagnosis not present

## 2022-11-28 ENCOUNTER — Telehealth: Payer: Self-pay | Admitting: Family Medicine

## 2022-11-28 NOTE — Telephone Encounter (Signed)
Encourage patient to contact the pharmacy for refills or they can request refills through [facility]   WHAT PHARMACY WOULD THEY LIKE THIS SENT TO:  [id number] - [place], [place] - [address] [place], [place] [address]    MEDICATION NAME & DOSE: meclizine (ANTIVERT) 25 MG tablet  NOTES/COMMENTS FROM PATIENT:      Front office please notify patient: It takes 48-72 hours to process rx refill requests Ask patient to call pharmacy to ensure rx is ready before heading there.

## 2022-11-28 NOTE — Telephone Encounter (Signed)
Ms Burnis Medin office explained that she was not in office today and pt would need to be seen . Pt refused she called our office back crying stating she don't have the money to come in a be seen by Dr Beverely Low for this medicine . I explained to Mrs Garciarodrigue that Dr Beverely Low is out of the office but before she left we did verbally discuss this issue and she stated pt would need to reach out to the other office which I related . Pt is now mad and upset with both offices . I advised her she can go to the pharmacy and pickup some OTC meclizine till she can be seen Tuesday or if the dizziness is bad she can go to urgent care . She hung up

## 2022-11-28 NOTE — Telephone Encounter (Signed)
Left pt a VM stating [name] ,MD gave pt this Rx and she will need to contact that office . I goggled the MD and she is with a family practice in [place]

## 2022-12-02 NOTE — Telephone Encounter (Signed)
Until patient is evaluated, I agree w/ OTC Meclizine

## 2022-12-03 ENCOUNTER — Telehealth: Payer: Self-pay | Admitting: Cardiology

## 2022-12-03 ENCOUNTER — Encounter: Payer: Self-pay | Admitting: Family Medicine

## 2022-12-03 NOTE — Telephone Encounter (Signed)
Pt c/o Shortness Of Breath: STAT if SOB developed within the last 24 hours or pt is noticeably SOB on the phone  1. Are you currently SOB (can you hear that pt is SOB on the phone)? Yes, cannot hear over the phone  2. How long have you been experiencing SOB? Ever since she had heart surgery   3. Are you SOB when sitting or when up moving around? Moving around and sitting   4. Are you currently experiencing any other symptoms? Fatigue, swelling in ankles (worse in left), nausea, and dizziness   Reports the only time she does not have SOB is when laying down.  States these are all ongoing symptoms. She is requesting a callback to schedule a sooner appt with Dr. Jens Som regarding this.   She has been f/u with her PCP regarding this and has been seeing the specialist she has referred her to for her symptoms.  Please advise.

## 2022-12-04 ENCOUNTER — Ambulatory Visit (INDEPENDENT_AMBULATORY_CARE_PROVIDER_SITE_OTHER): Payer: Medicare Other | Admitting: Podiatry

## 2022-12-04 ENCOUNTER — Telehealth (INDEPENDENT_AMBULATORY_CARE_PROVIDER_SITE_OTHER): Payer: Medicare Other | Admitting: Pulmonary Disease

## 2022-12-04 ENCOUNTER — Ambulatory Visit (INDEPENDENT_AMBULATORY_CARE_PROVIDER_SITE_OTHER): Payer: Medicare Other

## 2022-12-04 ENCOUNTER — Encounter: Payer: Self-pay | Admitting: Podiatry

## 2022-12-04 ENCOUNTER — Encounter: Payer: Self-pay | Admitting: Pulmonary Disease

## 2022-12-04 DIAGNOSIS — M7989 Other specified soft tissue disorders: Secondary | ICD-10-CM | POA: Diagnosis not present

## 2022-12-04 DIAGNOSIS — J479 Bronchiectasis, uncomplicated: Secondary | ICD-10-CM | POA: Diagnosis not present

## 2022-12-04 DIAGNOSIS — M216X1 Other acquired deformities of right foot: Secondary | ICD-10-CM

## 2022-12-04 DIAGNOSIS — M79672 Pain in left foot: Secondary | ICD-10-CM

## 2022-12-04 DIAGNOSIS — M79671 Pain in right foot: Secondary | ICD-10-CM

## 2022-12-04 DIAGNOSIS — M216X2 Other acquired deformities of left foot: Secondary | ICD-10-CM

## 2022-12-04 DIAGNOSIS — M5416 Radiculopathy, lumbar region: Secondary | ICD-10-CM | POA: Diagnosis not present

## 2022-12-04 DIAGNOSIS — M4135 Thoracogenic scoliosis, thoracolumbar region: Secondary | ICD-10-CM

## 2022-12-04 NOTE — Patient Instructions (Addendum)
We will order you home health for assistance with your nebulizer machine and maintenance of it  Use yupelri nebulizer treatments daily  Follow up in 6 months or sooner as needed

## 2022-12-04 NOTE — Progress Notes (Signed)
  Subjective:  Patient ID: [name], female    DOB: December 16, 1945,   MRN: [id number]  Chief Complaint  Patient presents with   Foot Pain    Bilateral numbness and swelling     77 y.o. female presents for concern of bilateral foot pain and numbness that has been going on for years. Relates recently her feet started to turn in and looked deformed. Relates they are not that bad today and comes and goes. . Relates her gait and balance have been affected. History of arthritis in her back since 2019.  [name] Denies any other pedal complaints. Denies n/v/f/c.   Past Medical History:  Diagnosis Date   Allergies    Anxiety    Arthritis    "maybe in my back" (03/31/2018)   Benign paroxysmal positional vertigo 06/08/2013   Complication of anesthesia    Fracture of multiple ribs 2015   "don't know from what; dx'd when I in hospital for 1st back OR" (03/31/2018)   GERD (gastroesophageal reflux disease)    Hair loss 04/12/2012   Herpes    History of blood transfusion    "twice; related to back OR" (03/31/2018)   History of kidney stones    Interstitial cystitis 11/06/2011   Melanoma of ankle (HCC) ~ 2003   "right"   Mitral regurgitation    Osteopenia 02/18/2012   Osteoporosis    PAF (paroxysmal atrial fibrillation) (HCC) 2012   Peripheral neuropathy 11/06/2011   PMDD (premenstrual dysphoric disorder)    PONV (postoperative nausea and vomiting)    nausea, vomiting, hives and dizziness    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/2020   Complete bilateral atrial lesion set using cryothermy and bipolar radiofrequency ablation with clipping of LA appendage via right mini-thoracotomy approach   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  CFT <3 seconds. Normal hair growth on digits. Edema and varicosities noted bilateral feet covered with makeup.   Skin. No lacerations or abrasions bilateral feet.  Musculoskeletal: MMT 5/5 bilateral lower extremities in DF, PF, Inversion and Eversion. Deceased ROM in DF of ankle joint. Mild pes cavus noted. No pain to palpation of bilateral feet today. Upon standing do notice hire arch and mild varus of calcaneus.  Neurological: Sensation intact to light touch.   Assessment:   1. Acquired bilateral pes cavus   2. Lumbar radiculopathy      Plan:  Patient was evaluated and treated and all questions answered. -Xrays reviewed. Bilateral moderate bunion deformities noted and mild pes cavus bilateral.  -Discussed treatement options; discussed pes planus deformity;conservative and  surgical  -Discussed custom orthotics and will see if covered by insurance.  -Discussed if not can try powersteps.  -Recommend good supportive shoes -Recommend daily stretching and icing -Patient to return to office as needed or sooner if condition worsens.   Louann Sjogren, DPM

## 2022-12-04 NOTE — Progress Notes (Signed)
Virtual Visit via Video Note  I connected with [name] on 12/04/22 at 11:15 AM EDT by a video enabled telemedicine application and verified that I am speaking with the correct person using two identifiers.  Location: Patient: home Provider: clinic   I discussed the limitations of evaluation and management by telemedicine and the availability of in person appointments. The patient expressed understanding and agreed to proceed.  History of Present Illness: [name] is a 77 year old woman with history of mitral regurgitation s/p minimally invassive MV repair and MAZE procedure complicated by LV free wall rupture 10/2019 and scoliosis with 7 spinal surgeries in 2015 who returns to pulmonary clinic for shortness of breath and bronchiectasis.  She has not been using yupelri nebulizer as she has concerns with all of the maintenance of the nebulizer machine/supplies. She denies cough or sputum production. She is having issues with nausea and dizziness of recent. She is also having issues with neuropathy.     Observations/Objective: Elderly woman, no acute distress No coughing during interview  Assessment and Plan: Bronchiectasis without complication  - she is to start yupelri nebulizer treatment daily - we will order home health for assistance with her nebulizer machine with instructions on use and cleaning of her machine/supplies - the etiology of her bronchiectasis is thought to be in setting of chronic aspiration. She had mildly elevated ANA in 2021. We can consider repeat inflammatory testing in the future.  Follow Up Instructions: - follow up in 6 months   I discussed the assessment and treatment plan with the patient. The patient was provided an opportunity to ask questions and all were answered. The patient agreed with the plan and demonstrated an understanding of the instructions.   The patient was advised to call back or seek an in-person evaluation if the symptoms worsen or if  the condition fails to improve as anticipated.  I provided 25 minutes of non-face-to-face time during this encounter.   Martina Sinner, MD

## 2022-12-05 DIAGNOSIS — I10 Essential (primary) hypertension: Secondary | ICD-10-CM | POA: Diagnosis not present

## 2022-12-05 DIAGNOSIS — I499 Cardiac arrhythmia, unspecified: Secondary | ICD-10-CM | POA: Diagnosis not present

## 2022-12-05 NOTE — Telephone Encounter (Signed)
Patient is following up, requesting a call back to discuss.

## 2022-12-05 NOTE — Telephone Encounter (Signed)
Spoke with pt, Aware of dr Ludwig Clarks recommendations. Discussed fluid restrictions with patient as she reports drinking a lot of water during the day.

## 2022-12-05 NOTE — Telephone Encounter (Signed)
She reports that she has a few questions.   She says that her ankles continue to be swollen. And now- they are "turning inward" She takes the fluid pills to help with swelling- She was referred to a podiatrist and she went to the podiatrist yesterday- they took X-rays of feet/ankles yesterday and recommended "inserts" to help with her feet/ankles; but they are checking with her insurance about coverage.  Basically the pt wants TO KNOW WHY HER FEET ARE TURNING INWARD, and if there is any further recommendations or specialists she can see to help with her feet.   Pt reports that her ankles/feet are much more swollen than what they were when she was last seen. Says her feet look deformed. It hurts to even walk.  Told her that the only other specialist I can think of to help would be Ortho. Also recommended wearing compression hose. Walking a little at a time/intermittently and elevating legs/feet several times a day.  Told her that I would send this information to Dr Jens Som and call back with any recommendations.

## 2022-12-08 ENCOUNTER — Telehealth: Payer: Self-pay

## 2022-12-08 NOTE — Telephone Encounter (Signed)
[facility] sent a form to be singed form mammogram placed in Dr Beverely Low to be signed folder

## 2022-12-09 NOTE — Telephone Encounter (Signed)
Form signed and returned to [facility] 

## 2022-12-10 DIAGNOSIS — L820 Inflamed seborrheic keratosis: Secondary | ICD-10-CM | POA: Diagnosis not present

## 2022-12-10 DIAGNOSIS — Z85820 Personal history of malignant melanoma of skin: Secondary | ICD-10-CM | POA: Diagnosis not present

## 2022-12-10 DIAGNOSIS — L570 Actinic keratosis: Secondary | ICD-10-CM | POA: Diagnosis not present

## 2022-12-10 DIAGNOSIS — D1801 Hemangioma of skin and subcutaneous tissue: Secondary | ICD-10-CM | POA: Diagnosis not present

## 2022-12-10 DIAGNOSIS — D2261 Melanocytic nevi of right upper limb, including shoulder: Secondary | ICD-10-CM | POA: Diagnosis not present

## 2022-12-10 DIAGNOSIS — L821 Other seborrheic keratosis: Secondary | ICD-10-CM | POA: Diagnosis not present

## 2022-12-10 DIAGNOSIS — L814 Other melanin hyperpigmentation: Secondary | ICD-10-CM | POA: Diagnosis not present

## 2022-12-15 ENCOUNTER — Telehealth: Payer: Self-pay | Admitting: Cardiology

## 2022-12-15 NOTE — Telephone Encounter (Signed)
In the beginning of phone call patient states she feels like she is going back into AFIB. She only feels palpations some nights when lying down. Her heart has been normal but was unable to give me any readings.   She denies any chest pain, shortness of breath, headache. She states her blood pressure fluctuates but her heart rate has been normal. No readings to give. States she has been stressed out because she can not get the medications she needs and her symptoms can be coming from that.    She states she just does not feel like herself and feels bad. I asked can she describe he symptoms. She states she has dizzy spells but nothing severe to where the room is spinning or that she will pas out. She was taking meclizine but has run out of medication. Advised she contact her PCP for a refill. PCP will not fill until she comes in for a visit.  She asked how do you feel about keflex for her dental cleaning. She states she has always taken keflex before her dental cleanings that her dentist would prescribe. He has retired. The person who has taken over will not prescribe keflex for because she feels its not effective. She offered patient another antibiotic but she refused. She states if she takes a different antibiotic it will make her sick. Did advise and she speak with her dentist about her concerns regarding antibiotics. She would like for you to prescribe her keflex for her dental cleaning.

## 2022-12-15 NOTE — Telephone Encounter (Signed)
Pt c/o medication issue:  1. Name of Medication: metoprolol tartrate (LOPRESSOR) 25 MG tablet   2. How are you currently taking this medication (dosage and times per day)? As written  3. Are you having a reaction (difficulty breathing--STAT)? no  4. What is your medication issue? Pt called asking to speak to nurse about this medication and what was discussed during last visit. Please advise.

## 2022-12-16 ENCOUNTER — Telehealth: Payer: Self-pay | Admitting: Podiatry

## 2022-12-16 NOTE — Telephone Encounter (Signed)
Spoke with the pt  She states that [name] showed her how to use her neb machine  She wants them to go over everything again  I have placed an order for this to be done

## 2022-12-16 NOTE — Telephone Encounter (Signed)
PT got compounded medications and her neb machine. Per Dr. Francine Graven we were to set up a home health care visit to show her how to use, clean and maintain her machine.  AVS notes read: We will order you home health for assistance with your nebulizer machine and maintenance of it.   She has had this issue in her chest for a long time and has an array of other health issues.   Please call PT to advise when this will be arranged. [phone]

## 2022-12-16 NOTE — Telephone Encounter (Signed)
Pt wanted to know if orthos have been approved through insurance. She stated the her condition is not improving; she has purchased different products to improve walking / standing but nothing has helped. Please advise

## 2022-12-17 NOTE — Telephone Encounter (Signed)
Spoke with pt, she refuses to take anything but the keflex because she has trouble with all the other medications. She reports she has the keflex for her appointment today.

## 2022-12-17 NOTE — Telephone Encounter (Signed)
We are still looking into it and will let her know when we hear back Thanks

## 2022-12-18 ENCOUNTER — Telehealth: Payer: Self-pay | Admitting: Pulmonary Disease

## 2022-12-18 NOTE — Telephone Encounter (Signed)
Pt was set up an appointment to see if she can qualify for oxygen.

## 2022-12-18 NOTE — Telephone Encounter (Signed)
The ONO results have been discussed already, multiple times   I called the pt and there was no answer- LMTCB   If she is so winded can not speak in full sentence she needs to seek emergency care asap

## 2022-12-18 NOTE — Telephone Encounter (Signed)
Called and spoke with pt. Pt states she is having trouble breathing, [employer] has came out to help her with her neb machine. Pt has not started her neb, she is waiting on her medicine. Pt has been set up for an OV on June 18th @11am  with TP

## 2022-12-18 NOTE — Telephone Encounter (Signed)
PT calling and has had an  ONO done weeks ago.  Is another appt ness so pt can move fwd with O2 RX. (Qualifying Walk?)  She states she struggles to breath even grocery shopping and going out into her yard. She just saw Dr. Francine Graven so I was puzzeled as to why this was not done before.   Sending back [employer] because even on the call she could barely get out words.   [phone]

## 2022-12-23 ENCOUNTER — Ambulatory Visit (INDEPENDENT_AMBULATORY_CARE_PROVIDER_SITE_OTHER): Payer: Medicare Other

## 2022-12-23 ENCOUNTER — Encounter: Payer: Self-pay | Admitting: Adult Health

## 2022-12-23 ENCOUNTER — Ambulatory Visit (INDEPENDENT_AMBULATORY_CARE_PROVIDER_SITE_OTHER): Payer: Medicare Other | Admitting: Adult Health

## 2022-12-23 VITALS — BP 120/80 | HR 77 | Temp 97.7°F | Ht 66.0 in | Wt 110.4 lb

## 2022-12-23 DIAGNOSIS — R0683 Snoring: Secondary | ICD-10-CM

## 2022-12-23 DIAGNOSIS — J479 Bronchiectasis, uncomplicated: Secondary | ICD-10-CM | POA: Diagnosis not present

## 2022-12-23 DIAGNOSIS — R0602 Shortness of breath: Secondary | ICD-10-CM

## 2022-12-23 DIAGNOSIS — R5383 Other fatigue: Secondary | ICD-10-CM | POA: Diagnosis not present

## 2022-12-23 DIAGNOSIS — G4734 Idiopathic sleep related nonobstructive alveolar hypoventilation: Secondary | ICD-10-CM

## 2022-12-23 DIAGNOSIS — R5381 Other malaise: Secondary | ICD-10-CM | POA: Diagnosis not present

## 2022-12-23 DIAGNOSIS — E43 Unspecified severe protein-calorie malnutrition: Secondary | ICD-10-CM

## 2022-12-23 MED ORDER — ALBUTEROL SULFATE HFA 108 (90 BASE) MCG/ACT IN AERS: 1.0000 | INHALATION_SPRAY | Freq: Four times a day (QID) | RESPIRATORY_TRACT | 2 refills | Status: DC | PRN

## 2022-12-23 NOTE — Patient Instructions (Addendum)
Chest xray today Continue on [employer] daily.  Albuterol inhaler As needed   Add Flutter valve daily.  Set up for home sleep study.  Activity as tolerated.  Order for rolling walker with bench seat and basket  High protein diet .  Follow up with Dr. Francine Graven in 6 weeks and As needed   Please contact office for sooner follow up if symptoms do not improve or worsen or seek emergency care

## 2022-12-23 NOTE — Assessment & Plan Note (Signed)
Fatigue and physical deconditioning.  Patient has significant generalized weakness.  Has difficulty with balance issues.  Recommend a rolling walker with bench seat

## 2022-12-23 NOTE — Assessment & Plan Note (Signed)
Bronchiectasis with moderate restriction on PFTs and tree-in-bud nodularity on CT scan.  Patient with minimum cough or congestion.  But chronic shortness of breath.  Recently started [place].  Will give her a few weeks on this to see if it makes any significant change in her level of dyspnea.  Check chest x-ray today.  Could consider adding an LABA on return.  Would hold off on ICS for now. Add in flutter valve for mucociliary clearance. High-protein diet  Plan  Patient Instructions  Chest xray today Continue on [employer] daily.  Albuterol inhaler As needed   Add Flutter valve daily.  Set up for home sleep study.  Activity as tolerated.  Order for rolling walker with bench seat and basket  High protein diet .  Follow up with Dr. Francine Graven in 6 weeks and As needed   Please contact office for sooner follow up if symptoms do not improve or worsen or seek emergency care

## 2022-12-23 NOTE — Assessment & Plan Note (Signed)
Continue on high protein diet   

## 2022-12-23 NOTE — Assessment & Plan Note (Signed)
Overnight oximetry test in March 2024 showed some nocturnal desaturations.  Concern for possible underlying sleep apnea.  Patient has significant cardiac history with A-fib she has associated daytime sleepiness and restless sleep.  High suspicion for underlying sleep apnea.  Set patient up for a home sleep study.  Walk test in the office shows no ambulatory desaturations.[name]

## 2022-12-23 NOTE — Progress Notes (Signed)
@Patient  ID: [name], female    DOB: 04-09-1946, 77 y.o.   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  History of scoliosis status post multiple spinal surgeries+  TEST/EVENTS :  PFT 12/2020 Moderate restriction with + BD response   12/23/2022 Follow up: Bronchiectasis  Patient returns for a follow-up visit.  Patient complains over the last year she feels her breathing is going downhill.  Over the last several weeks gets more short of breath with activities with decreased activity tolerance.  Came in today to see if her oxygen levels dropped while she is walking.  O2 saturations remained above 90% on room air with ambulation.  Averaging 91 to 93% walking.  Patient has to use a cane for walking.  Has chronic back pain.  She takes pain medications daily.  Patient had overnight oximetry test in March 2024 that showed nocturnal desaturations there was suspicion for possible sleep disorder.patient does complain of daytime sleepiness and restless sleep.  Is unclear if she snores.  We discussed setting up for home sleep study.  She has no history of stroke or systolic congestive heart failure.   Patient has underlying bronchiectasis, started on Yupelri last week. Denies any cough or congestion.  CT chest August 2023 showed tree-in-bud nodularity in the right middle lobe and left upper lobe.  6 mm left lung nodule.  She denies any hemoptysis. Patient says she is unable to do physical therapy due to severe back pain And she has to use a cane.  Has trouble walking for prolonged period of time also has balance issues.  Has very low strength in her arms and legs.   Allergies   Allergen Reactions   Buprenorphine Nausea Only and Other (See Comments)    sedation and adhesive reaction Other reaction(s): nausea, sedation and adhesive reaction Other reaction(s): nausea, sedation and adhesive reaction, Other (See Comments), Other (See Comments) sedation and adhesive reaction Other reaction(s): nausea, sedation and adhesive reaction sedation and adhesive reaction Other reaction(s): nausea, sedation and adhesive reaction sedation and adhesive reaction Other reaction(s): nausea, sedation and adhesive reaction sedation and adhesive reaction Other reaction(s): nausea, sedation and adhesive reaction sedation and adhesive reaction Other reaction(s): nausea, sedation and adhesive reaction sedation and adhesive reaction Other reaction(s): nausea, sedation and adhesive reaction   Erythromycin Nausea And Vomiting and Other (See Comments)    Dizziness  Other reaction(s): Other (See Comments) Dizziness  unknown unknown Dizziness  BURNING ALL OVER  BURNING ALL OVER  unknown Dizziness  BURNING ALL OVER    Iodinated Contrast Media Hives, Nausea Only and Rash    unknown unknown unknown   Latex Palpitations, Other (See Comments) and Rash     hives and sores Other reaction(s): hives and sores Other reaction(s):  hives and sores, Other (See Comments), Other (See Comments)  hives and sores Other reaction(s): hives and sores hives hives  hives and sores Other reaction(s): hives and sores hives Sores  hives and sores Other reaction(s): hives and sores hives Sores  hives and sores Other reaction(s): hives and sores hives  hives and sores Other reaction(s): hives and sores hives Sores  hives and sores Other reaction(s): hives and sores   Other Other (See  Comments)    CAN NOT TAKE , aLPRAZOLAM, OR ELAVIL DUE TO AFIB FOR ANXIETY  IV CONTRAST /"DYE". Other reaction(s): hives and sores Other reaction(s): Other (See Comments), Other (See Comments) CAN NOT TAKE  , aLPRAZOLAM, OR ELAVIL DUE TO AFIB FOR ANXIETY  IV CONTRAST /"DYE". Other reaction(s): hives and sores IV CONTRAST /"DYE". CAN NOT TAKE , aLPRAZOLAM, OR ELAVIL DUE TO AFIB FOR ANXIETY  IV CONTRAST /"DYE". Other reaction(s): hives and sores Patient states that every time she has anesthesia, she wakes up vomiting, has chills, blood pressure drops CAN NOT TAKE , aLPRAZOLAM, OR ELAVIL DUE TO AFIB FOR ANXIETY  IV CONTRAST /"DYE". Other reaction(s): hives and sores IV CONTRAST /"DYE". Patient states that every time she has anesthesia, she wakes up vomiting, has chills, blood pressure drops CAN NOT TAKE , aLPRAZOLAM, OR ELAVIL DUE TO AFIB FOR ANXIETY  IV CONTRAST /"DYE". Other reaction(s): hives and sores IV CONTRAST /"DYE". CAN NOT TAKE , aLPRAZOLAM, OR ELAVIL DUE TO AFIB FOR ANXIETY  IV CONTRAST /"DYE". Other reaction(s): hives and sores Patient states that every time she has anesthesia, she wakes up vomiting, has chills, blood pressure drops CAN NOT TAKE , aLPRAZOLAM, OR ELAVIL DUE TO AFIB FOR ANXIETY  IV CONTRAST /"DYE". Other reaction(s): hives and sores   Tape Rash, Other (See Comments) and Itching    Heart monitor stickers must be rotated in order to prevent rash Other reaction(s): hives and sores Adhesive on EKG tabs=BURNS SKIN**PAPER TAPE OK** sores Adhesive on EKG tabs. Per Pt, Ok w/Paper Tape Adhesive on EKG tabs=BURNS SKIN**PAPER TAPE OK** sores Other reaction(s): Other (See Comments) Heart monitor stickers must be rotated in order to prevent rash Adhesive on EKG tabs=BURNS SKIN**PAPER TAPE OK** sores   Ciprofloxacin Other (See Comments)    Other reaction(s): Other (See Comments) Burning all over Burning all over Burning all over   Duloxetine Hcl     Severe diarrhea and upset stomach Other reaction(s): Other (See Comments), Unknown Severe diarrhea and upset stomach Severe diarrhea and upset stomach Severe diarrhea and upset stomach   Erythromycin Base      Other reaction(s): Other (See Comments), Unknown   Hydrocodone Other (See Comments)    Sedation,dizziness, and nausea Other reaction(s): sedation and nausea Other reaction(s): sedation and nausea Other reaction(s): Other (See Comments), Other (See Comments), sedation and nausea Sedation,dizziness, and nausea Other reaction(s): sedation and nausea Other reaction(s): sedation and nausea Sedation,dizziness, and nausea Other reaction(s): sedation and nausea Other reaction(s): sedation and nausea Sedation,dizziness, and nausea Other reaction(s): sedation and nausea Other reaction(s): sedation and nausea Sedation,dizziness, and nausea Other reaction(s): sedation and nausea Other reaction(s): sedation and nausea Sedation,dizziness, and nausea Other reaction(s): sedation and nausea Other reaction(s): sedation and nausea Sedation,dizziness, and nausea Other reaction(s): sedation and nausea Other reaction(s): sedation and nausea   Hydromorphone Other (See Comments)    Sedation,dizziness, and nausea Other reaction(s): sedation and nausea Other reaction(s): sedation and nausea Other reaction(s): Other (See Comments), Other (See Comments), sedation and nausea Sedation,dizziness, and nausea Other reaction(s): sedation and nausea Other reaction(s): sedation and nausea Sedation,dizziness, and nausea Other reaction(s): sedation and nausea Other reaction(s): sedation and nausea Sedation,dizziness, and nausea Other reaction(s): sedation and nausea Other reaction(s): sedation and nausea Sedation,dizziness, and nausea Other reaction(s): sedation and nausea Other reaction(s): sedation and nausea Sedation,dizziness, and nausea Other reaction(s): sedation and nausea Other reaction(s): sedation and nausea Sedation,dizziness, and nausea Other reaction(s): sedation and nausea Other reaction(s): sedation and nausea   Iodine     unknown Other  reaction(s): Other (See  Comments) unknown unknown unknown unknown unknown unknown   Levofloxacin Nausea And Vomiting and Nausea Only   Metrizamide Hives   Nsaids     CANNOT TAKE PER CARDIOLOGIST DUE TO AFIB    Nucynta [Tapentadol Hcl]     nausea and sedation   Oxycodone Other (See Comments)    Delusions (intolerance)  PILLS ONLY sedation, dizziness, nausea,  and itching Other reaction(s): sedation and nausea Other reaction(s): sedation and nausea Other reaction(s): Hallucinations, Other Other reaction(s): Other (See Comments), sedation and nausea Delusions (intolerance)  PILLS ONLY sedation, dizziness, nausea,  and itching Other reaction(s): sedation and nausea Other reaction(s): sedation and nausea Delusions (intolerance)  PILLS ONLY sedation, dizziness, nausea,  and itching Other reaction(s): sedation and nausea Other reaction(s): sedation and nausea Delusions (intolerance)  PILLS ONLY sedation, dizziness, nausea,  and itching Other reaction(s): sedation and nausea Other reaction(s): sedation and nausea Delusions (intolerance)  PILLS ONLY sedation, dizziness, nausea,  and itching Other reaction(s): sedation and nausea Other reaction(s): sedation and nausea Delusions (intolerance)  PILLS ONLY sedation, dizziness, nausea,  and itching Other reaction(s): sedation and nausea Other reaction(s): sedation and nausea Delusions (intolerance)  PILLS ONLY sedation, dizziness, nausea,  and itching Other reaction(s): sedation and nausea Other reaction(s): sedation and nausea   Pentazocine Other (See Comments)    Unknown Other reaction(s): Unknown Other reaction(s): Unknown Other reaction(s): Other (See Comments), Other (See Comments), Unknown Unknown Other reaction(s): Unknown Other reaction(s): Unknown Unknown Other reaction(s): Unknown Other reaction(s): Unknown Unknown Other reaction(s): Unknown Other reaction(s): Unknown Unknown Other reaction(s): Unknown Other reaction(s):  Unknown Unknown Other reaction(s): Unknown Other reaction(s): Unknown Unknown Other reaction(s): Unknown Other reaction(s): Unknown   Septra [Bactrim]     Hives    Sulfasalazine Hives   Morphine Anxiety, Other (See Comments) and Nausea Only    sedation and nausea Other reaction(s): sedation and nausea Other reaction(s): sedation and nausea Other reaction(s): Delusions (intolerance), Other (See Comments), Other (See Comments), sedation and nausea sedation and nausea Other reaction(s): sedation and nausea Other reaction(s): sedation and nausea sedation and nausea Other reaction(s): sedation and nausea Other reaction(s): sedation and nausea PT CANNOT WAKE UP AFTER TAKING MEDICATION AND HAS NIGHTMARES  sedation and nausea Other reaction(s): sedation and nausea Other reaction(s): sedation and nausea PT CANNOT WAKE UP AFTER TAKING MEDICATION AND HAS NIGHTMARES  sedation and nausea Other reaction(s): sedation and nausea Other reaction(s): sedation and nausea sedation and nausea Other reaction(s): sedation and nausea Other reaction(s): sedation and nausea PT CANNOT WAKE UP AFTER TAKING MEDICATION AND HAS NIGHTMARES  sedation and nausea Other reaction(s): sedation and nausea Other reaction(s): sedation and nausea   Sulfa Antibiotics Hives, Nausea Only and Rash    rash rash rash   Sulfamethoxazole-Trimethoprim Rash    Hives  Hives  Hives  Hives  Hives  Hives    Tapentadol Nausea Only    Other reaction(s): nausea and sedation, Other (See Comments) nausea and sedation nausea and sedation    Immunization History  Administered Date(s) Administered   DTaP 07/07/2009   Influenza Inj Mdck Quad Pf 03/25/2019   Influenza Split 03/07/2012   Influenza, High Dose Seasonal PF 05/02/2016   Influenza, Seasonal, Injecte, Preservative Fre 05/08/2018, 03/25/2019   Influenza,inj,Quad PF,6+ Mos 06/08/2013, 04/06/2015, 05/02/2017, 05/08/2018   Influenza-Unspecified 03/07/2012,  06/08/2013, 04/06/2015, 05/02/2016, 05/02/2017, 05/08/2018, 04/26/2021   PFIZER(Purple Top)SARS-COV-2 Vaccination 08/11/2019, 09/01/2019   Pneumococcal Conjugate-13 04/19/2015   Pneumococcal Polysaccharide-23 08/07/2009, 12/27/2013   Pneumococcal-Unspecified 04/19/2015   Tdap 08/02/2020   Zoster Recombinat (  Shingrix) 07/08/2010   Zoster, Live 07/07/2010    Past Medical History:  Diagnosis Date   Allergies    Anxiety    Arthritis    "maybe in my back" (03/31/2018)   Benign paroxysmal positional vertigo 06/08/2013   Complication of anesthesia    Fracture of multiple ribs 2015   "don't know from what; dx'd when I in hospital for 1st back OR" (03/31/2018)   GERD (gastroesophageal reflux disease)    Hair loss 04/12/2012   Herpes    History of blood transfusion    "twice; related to back OR" (03/31/2018)   History of kidney stones    Interstitial cystitis 11/06/2011   Melanoma of ankle (HCC) ~ 2003   "right"   Mitral regurgitation    Osteopenia 02/18/2012   Osteoporosis    PAF (paroxysmal atrial fibrillation) (HCC) 2012   Peripheral neuropathy 11/06/2011   PMDD (premenstrual dysphoric disorder)    PONV (postoperative nausea and vomiting)    nausea, vomiting, hives and dizziness    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/2020   Complete bilateral atrial lesion set using cryothermy and bipolar radiofrequency ablation with clipping of LA appendage via right mini-thoracotomy approach   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   Complex valvuloplasty including artificial Gore-tex neochord placement x12 with 32mm Sorin Memo 4D ring annuloplasty   Seasonal allergies    Vaginal delivery    ONE NSVD   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    Tobacco History: Social History   Tobacco Use  Smoking Status Former   Packs/day: 0.10   Years: 14.00   Additional pack years: 0.00   Total pack years: 1.40   Types: Cigarettes   Quit date: 1980   Years since quitting: 44.4  Smokeless Tobacco Never   Tobacco Comments   03/31/2018 "quit ~ 1980; someday smoker when I did smoke; never addicted"   Counseling given: Not Answered Tobacco comments: 03/31/2018 "quit ~ 1980; someday smoker when I did smoke; never addicted"   Outpatient Medications Prior to Visit  Medication Sig Dispense Refill   acetaminophen (TYLENOL) 500 MG tablet Take 500 mg by mouth every 6 (six) hours as needed for moderate pain.      apixaban (ELIQUIS) 5 MG TABS tablet Take 1 tablet (5 mg total) by mouth 2 (two) times daily. 60 tablet 5   b complex vitamins tablet Take 1 tablet by mouth daily.     Calcium Carbonate Antacid (TUMS CHEWY BITES PO) Take 1 tablet by mouth daily as needed (reflux).      Calcium Citrate-Vitamin D (CALCIUM + D PO) Take 1 tablet by mouth daily.     Carboxymethylcellul-Glycerin (LUBRICATING EYE DROPS OP) Place 1 drop into both eyes daily as needed (dry eyes).     cephALEXin (KEFLEX) 500 MG capsule Take 2 grams (4 tablets) by mouth 1 hour prior to dental cleaning/procedure. 4 capsule 2   clonazePAM (KLONOPIN) 0.5 MG tablet Take 1 tablet by mouth twice daily as needed for anxiety 30 tablet 0   denosumab (PROLIA) 60 MG/ML SOSY injection Inject 60 mg into the skin every 6 (six) months.     furosemide (LASIX) 40 MG tablet Take 1 tablet (40 MG) by mouth on Monday, Wednesday, Friday and Sunday. 90 tablet 3   furosemide (LASIX) 40 MG tablet Take 1.5 tablets (60 mg total) by mouth as directed. Take 1.5 tablet by mouth on Tuesday, Thursday, and Saturday. 90 tablet 3   levothyroxine (SYNTHROID) 75 MCG tablet Take 1 tablet (75 mcg total)  by mouth daily. 90 tablet 0   meclizine (ANTIVERT) 25 MG tablet TAKE 1 TABLET BY MOUTH THREE TIMES DAILY AS NEEDED FOR DIZZINESS 30 tablet 0   metoprolol tartrate (LOPRESSOR) 25 MG tablet Take 1 tablet (25 mg total) by mouth 2 (two) times daily. 180 tablet 3   mupirocin ointment (BACTROBAN) 2 % Apply 1 Application topically daily.     Nutritional Supplements (,FEEDING SUPPLEMENT,  PROSOURCE PLUS) liquid Take 30 mLs by mouth 2 (two) times daily between meals. 887 mL 0   nystatin (MYCOSTATIN/NYSTOP) powder Apply topically 2 (two) times daily as needed (skin irritation (under breasts)). 15 g 0   ondansetron (ZOFRAN) 4 MG tablet Take 1 tablet (4 mg total) by mouth every 8 (eight) hours as needed. 30 tablet 0   oxyCODONE (ROXICODONE) 5 MG/5ML solution Take 4 mLs (4 mg total) by mouth every 4 (four) hours as needed for moderate pain.  0   pantoprazole (PROTONIX) 40 MG tablet Take 1 tablet (40 mg total) by mouth daily. 30 tablet 5   polyethylene glycol (MIRALAX / GLYCOLAX) packet Take 17 g by mouth daily as needed for mild constipation.     potassium chloride SA (KLOR-CON M) 20 MEQ tablet Take 1 tablet (20 mEq total) by mouth daily. 90 tablet 3   revefenacin (YUPELRI) 175 MCG/3ML nebulizer solution Take 3 mLs (175 mcg total) by nebulization daily. 90 mL 11   sodium chloride (OCEAN) 0.65 % SOLN nasal spray Place 1 spray into both nostrils as needed for congestion (nose bleeds).     valACYclovir (VALTREX) 500 MG tablet Take 500 mg by mouth daily as needed (breakouts).      No facility-administered medications prior to visit.     Review of Systems:   Constitutional:   No  weight loss, night sweats,  Fevers, chills, + fatigue, or  lassitude.  HEENT:   No headaches,  Difficulty swallowing,  Tooth/dental problems, or  Sore throat,                No sneezing, itching, ear ache, nasal congestion, post nasal drip,   CV:  No chest pain,  Orthopnea, PND, swelling in lower extremities, anasarca, dizziness, palpitations, syncope.   GI  No heartburn, indigestion, abdominal pain, nausea, vomiting, diarrhea, change in bowel habits, loss of appetite, bloody stools.   Resp:  No chest wall deformity  Skin: no rash or lesions.  GU: no dysuria, change in color of urine, no urgency or frequency.  No flank pain, no hematuria   MS:  +back pain     Physical Exam  BP 120/80 (BP  Location: Left Arm, Patient Position: Sitting, Cuff Size: Normal)   Pulse 77   Temp 97.7 F (36.5 C) (Oral)   Ht 5\' 6"  (1.676 m)   Wt 110 lb 6.4 oz (50.1 kg)   SpO2 91%   BMI 17.82 kg/m   GEN: A/Ox3; pleasant , NAD, frail and cachexic , cane    HEENT:  [place]/AT,  EACs-clear, TMs-wnl, NOSE-clear, THROAT-clear, no lesions, no postnasal drip or exudate noted.   NECK:  Supple w/ fair ROM; no JVD; normal carotid impulses w/o bruits; no thyromegaly or nodules palpated; no lymphadenopathy.    RESP  Clear  P & A; w/o, wheezes/ rales/ or rhonchi. no accessory muscle use, no dullness to percussion  CARD:  RRR, no m/r/g, tr  peripheral edema, pulses intact, no cyanosis or clubbing.  GI:   Soft & nt; nml bowel sounds; no organomegaly or masses  detected.   Musco: Warm bil, no deformities or joint swelling noted.   Neuro: alert, no focal deficits noted.    Skin: Warm, no lesions or rashes    Lab Results:      Imaging:       Latest Ref Rng & Units 01/01/2021   11:49 AM 12/01/2019    9:39 AM  PFT Results  FVC-Pre L 1.55  2.13   FVC-Predicted Pre % 51  69   FVC-Post L 1.67    FVC-Predicted Post % 55    Pre FEV1/FVC % % 74  74   Post FEV1/FCV % % 81    FEV1-Pre L 1.15  1.57   FEV1-Predicted Pre % 50  68   FEV1-Post L 1.36    DLCO uncorrected ml/min/mmHg 21.48  14.09   DLCO UNC% % 105  68   DLCO corrected ml/min/mmHg 22.12  14.56   DLCO COR %Predicted % 108  71   DLVA Predicted % 110  105   TLC L 4.50  5.36   TLC % Predicted % 84  100   RV % Predicted % 131  140     No results found for: "NITRICOXIDE"      Assessment & Plan:   Bronchiectasis (HCC) Bronchiectasis with moderate restriction on PFTs and tree-in-bud nodularity on CT scan.  Patient with minimum cough or congestion.  But chronic shortness of breath.  Recently started [place].  Will give her a few weeks on this to see if it makes any significant change in her level of dyspnea.  Check chest x-ray today.  Could  consider adding an LABA on return.  Would hold off on ICS for now. Add in flutter valve for mucociliary clearance. High-protein diet  Plan  Patient Instructions  Chest xray today Continue on [employer] daily.  Albuterol inhaler As needed   Add Flutter valve daily.  Set up for home sleep study.  Activity as tolerated.  Order for rolling walker with bench seat and basket  High protein diet .  Follow up with Dr. Francine Graven in 6 weeks and As needed   Please contact office for sooner follow up if symptoms do not improve or worsen or seek emergency care        Nocturnal hypoxemia Overnight oximetry test in March 2024 showed some nocturnal desaturations.  Concern for possible underlying sleep apnea.  Patient has significant cardiac history with A-fib she has associated daytime sleepiness and restless sleep.  High suspicion for underlying sleep apnea.  Set patient up for a home sleep study.  Walk test in the office shows no ambulatory desaturations..  SOB (shortness of breath) Chronic dyspnea suspect is multifactorial.  Patient has underlying moderate restriction on PFTs along with bronchiectasis and significant deconditioning from chronic back pain with multiple spinal surgeries.  Patient is very limited in her activity tolerance and says she is not able to do any type of exercise or physical therapy. For now we will continue on bronchodilators for her bronchiectasis.  Check chest x-ray today.  Check sleep study for possible underlying sleep apnea  Plan  Patient Instructions  Chest xray today Continue on Yupelri Neb daily.  Albuterol inhaler As needed   Add Flutter valve daily.  Set up for home sleep study.  Activity as tolerated.  Order for rolling walker with bench seat and basket  High protein diet .  Follow up with Dr. Francine Graven in 6 weeks and As needed   Please contact office for sooner  follow up if symptoms do not improve or worsen or seek emergency care        Severe  protein-calorie malnutrition (HCC) Continue on high-protein diet  Malaise and fatigue Fatigue and physical deconditioning.  Patient has significant generalized weakness.  Has difficulty with balance issues.  Recommend a rolling walker with bench seat     Rubye Oaks, NP 12/23/2022

## 2022-12-23 NOTE — Assessment & Plan Note (Signed)
Chronic dyspnea suspect is multifactorial.  Patient has underlying moderate restriction on PFTs along with bronchiectasis and significant deconditioning from chronic back pain with multiple spinal surgeries.  Patient is very limited in her activity tolerance and says she is not able to do any type of exercise or physical therapy. For now we will continue on bronchodilators for her bronchiectasis.  Check chest x-ray today.  Check sleep study for possible underlying sleep apnea  Plan  Patient Instructions  Chest xray today Continue on Yupelri Neb daily.  Albuterol inhaler As needed   Add Flutter valve daily.  Set up for home sleep study.  Activity as tolerated.  Order for rolling walker with bench seat and basket  High protein diet .  Follow up with Dr. Francine Graven in 6 weeks and As needed   Please contact office for sooner follow up if symptoms do not improve or worsen or seek emergency care

## 2022-12-23 NOTE — Progress Notes (Signed)
Patient seen in the office today and instructed on use of albuterol inhaler and flutter.  Patient expressed understanding and demonstrated technique.

## 2022-12-25 DIAGNOSIS — I499 Cardiac arrhythmia, unspecified: Secondary | ICD-10-CM | POA: Diagnosis not present

## 2022-12-25 DIAGNOSIS — I10 Essential (primary) hypertension: Secondary | ICD-10-CM | POA: Diagnosis not present

## 2022-12-26 ENCOUNTER — Other Ambulatory Visit: Payer: Self-pay | Admitting: Family Medicine

## 2022-12-26 ENCOUNTER — Telehealth: Payer: Self-pay | Admitting: Adult Health

## 2022-12-26 DIAGNOSIS — R040 Epistaxis: Secondary | ICD-10-CM | POA: Diagnosis not present

## 2022-12-26 DIAGNOSIS — H903 Sensorineural hearing loss, bilateral: Secondary | ICD-10-CM | POA: Diagnosis not present

## 2022-12-26 DIAGNOSIS — H838X3 Other specified diseases of inner ear, bilateral: Secondary | ICD-10-CM | POA: Diagnosis not present

## 2022-12-26 NOTE — Telephone Encounter (Signed)
Pt. Calling want further instructions on flutter valve and on her rescue inhaler please advise

## 2022-12-26 NOTE — Telephone Encounter (Signed)
Patient is requesting a refill of the following medications: Requested Prescriptions   Pending Prescriptions Disp Refills   clonazePAM (KLONOPIN) 0.5 MG tablet [Pharmacy Med Name: clonazePAM 0.5 MG Oral Tablet] 30 tablet 0    Sig: Take 1 tablet by mouth twice daily as needed for anxiety    Date of patient request: 12/26/22 Last office visit: 08/20/22 Date of last refill: 12/02/22 Last refill amount: 30

## 2022-12-26 NOTE — Telephone Encounter (Signed)
Pt. Calling back had more questions

## 2022-12-26 NOTE — Telephone Encounter (Signed)
ATC X1, sounded like someone picked up the phone I said hello several times after no response I hung up. Will try patient again later

## 2022-12-26 NOTE — Telephone Encounter (Signed)
Spoke with patient. Tried to explain how to use flutter valve. She is currently in doctors office and will call back shortly

## 2022-12-29 NOTE — Telephone Encounter (Signed)
Pt has been notified.

## 2022-12-29 NOTE — Telephone Encounter (Signed)
Pt calling back in regards to furthermore questions about her flutter valve & nebulizer

## 2022-12-31 ENCOUNTER — Encounter: Payer: Self-pay | Admitting: Pharmacist

## 2022-12-31 NOTE — Telephone Encounter (Signed)
Called and spoke with patient. Patient was unsure how and when to use her flutter valve.  Instructions on how to use flutter valve were given.  Patient repeated instructions back to me.  I discussed cleaning instructions also for flutter valve.  Patient was unsure how to use he albuterol inhaler.  Explained what albuterol was used for and when to use her inhaler.  Instructions on how to use her albuterol inhaler given.  Patient repeated albuterol instructions back to me.  Patient also given nebulizer instructions.  Patient has been using her neb machine daily as instructed with no issues.  All patient questions answered and I advised patient to call off if she has any other questions or concerns.  Understanding stated. Nothing further at this time.

## 2022-12-31 NOTE — Progress Notes (Signed)
Patient previously followed by [facility] pharmacist. Per clinical review, no pharmacist appointment needed at this time. Care guide directed to contact patient and cancel appointment and notify pharmacy team of any patient concerns.  

## 2023-01-04 DIAGNOSIS — I499 Cardiac arrhythmia, unspecified: Secondary | ICD-10-CM | POA: Diagnosis not present

## 2023-01-04 DIAGNOSIS — I10 Essential (primary) hypertension: Secondary | ICD-10-CM | POA: Diagnosis not present

## 2023-01-05 NOTE — Progress Notes (Signed)
Called and spoke with patient, advised of results/recommendations per Tammy [name] NP.  She verbalized understanding.  Nothing further needed.

## 2023-01-07 ENCOUNTER — Encounter: Payer: Medicare Other | Admitting: Pharmacist

## 2023-01-07 ENCOUNTER — Telehealth: Payer: Self-pay | Admitting: Pulmonary Disease

## 2023-01-07 ENCOUNTER — Ambulatory Visit: Payer: Medicare Other | Admitting: Adult Health

## 2023-01-07 DIAGNOSIS — G4733 Obstructive sleep apnea (adult) (pediatric): Secondary | ICD-10-CM

## 2023-01-07 DIAGNOSIS — R0683 Snoring: Secondary | ICD-10-CM

## 2023-01-09 ENCOUNTER — Telehealth: Payer: Self-pay | Admitting: Pulmonary Disease

## 2023-01-09 NOTE — Telephone Encounter (Signed)
Called patient she is going to come back in for [place] to go over the machine again

## 2023-01-09 NOTE — Telephone Encounter (Signed)
Pt states the HST machine didn't work and is asking for a call to see what to do

## 2023-01-10 DIAGNOSIS — Z87891 Personal history of nicotine dependence: Secondary | ICD-10-CM | POA: Diagnosis not present

## 2023-01-10 DIAGNOSIS — R630 Anorexia: Secondary | ICD-10-CM | POA: Diagnosis not present

## 2023-01-10 DIAGNOSIS — R079 Chest pain, unspecified: Secondary | ICD-10-CM | POA: Diagnosis not present

## 2023-01-10 DIAGNOSIS — R0602 Shortness of breath: Secondary | ICD-10-CM | POA: Diagnosis not present

## 2023-01-10 DIAGNOSIS — I4891 Unspecified atrial fibrillation: Secondary | ICD-10-CM | POA: Diagnosis not present

## 2023-01-12 ENCOUNTER — Other Ambulatory Visit: Payer: Self-pay | Admitting: Family Medicine

## 2023-01-12 DIAGNOSIS — E785 Hyperlipidemia, unspecified: Secondary | ICD-10-CM

## 2023-01-12 DIAGNOSIS — M4693 Unspecified inflammatory spondylopathy, cervicothoracic region: Secondary | ICD-10-CM | POA: Diagnosis not present

## 2023-01-12 DIAGNOSIS — M5412 Radiculopathy, cervical region: Secondary | ICD-10-CM | POA: Diagnosis not present

## 2023-01-12 DIAGNOSIS — G894 Chronic pain syndrome: Secondary | ICD-10-CM | POA: Diagnosis not present

## 2023-01-12 DIAGNOSIS — Z79891 Long term (current) use of opiate analgesic: Secondary | ICD-10-CM | POA: Diagnosis not present

## 2023-01-12 DIAGNOSIS — M961 Postlaminectomy syndrome, not elsewhere classified: Secondary | ICD-10-CM | POA: Diagnosis not present

## 2023-01-13 DIAGNOSIS — G4733 Obstructive sleep apnea (adult) (pediatric): Secondary | ICD-10-CM | POA: Diagnosis not present

## 2023-01-14 ENCOUNTER — Telehealth: Payer: Self-pay | Admitting: Adult Health

## 2023-01-14 DIAGNOSIS — N644 Mastodynia: Secondary | ICD-10-CM | POA: Diagnosis not present

## 2023-01-14 NOTE — Telephone Encounter (Signed)
Pt calling in for HST results as well as letting her doctor know she was just in the hospital

## 2023-01-14 NOTE — Telephone Encounter (Signed)
77 year old female history of A-fib, GERD, chronic bronchiectasis calling in because she is having increased dyspnea, fatigue, and states that she is afraid to fall asleep.  She states that she recently underwent sleep testing but does not know the results of these tests.  She went to the emergency department earlier in the week because she woke up groggy and felt severely fatigued.  I reviewed the results of her home sleep study with her-I explained to her what obstructive sleep apnea is.  We talked about elevating the head of the bed with placing blocks underneath the bed frame.  She does not feel that she can do this currently, but she does have a folding bed so she will attempt to fold the bed a few inches until she can follow-up with sleep medicine.  Primarily provided reassurance and explained to her the process of obtaining CPAP/BiPAP.  I recommended that she follow-up with her sleep medicine colleagues in clinic after her sleep titration study.

## 2023-01-15 ENCOUNTER — Encounter: Payer: Self-pay | Admitting: Family Medicine

## 2023-01-15 ENCOUNTER — Other Ambulatory Visit: Payer: Self-pay | Admitting: *Deleted

## 2023-01-15 DIAGNOSIS — G4733 Obstructive sleep apnea (adult) (pediatric): Secondary | ICD-10-CM

## 2023-01-15 DIAGNOSIS — R0902 Hypoxemia: Secondary | ICD-10-CM

## 2023-01-15 DIAGNOSIS — G4734 Idiopathic sleep related nonobstructive alveolar hypoventilation: Secondary | ICD-10-CM

## 2023-01-15 NOTE — Telephone Encounter (Signed)
Results are back and she needs a CPAP titration study . Please see result note from 01/13/23.

## 2023-01-15 NOTE — Telephone Encounter (Signed)
See result note. Will close encounter.

## 2023-01-15 NOTE — Progress Notes (Signed)
Called and spoke with patient, advised of results/recommendations per Rubye Oaks NP.  She agreed to the in lab CPAP titration.  She verbalized understanding.  Discussed how to use albuterol.  She verbalized understanding.  Nothing further needed.

## 2023-01-21 NOTE — Progress Notes (Signed)
Cardiology Office Note:  .   Date:  01/23/2023  ID:  [name], DOB [birth year], MRN [id number] PCP: Sheliah Hatch, MD  [facility] Providers Cardiologist:  Olga Millers, MD Electrophysiologist:  Will Jorja Loa, MD    History of Present Illness: .   [name] is a 77 y.o. female with a hx of chronic dyspnea, PAF, previous MV repair, coronary artery disease.  Patient is followed by Dr. Jens Som and presents today for a 1 month follow-up appointment.   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  For further evaluation, patient underwent TEE on 11/21/2019 that showed severe mitral valve regurgitation with a flail segment of the anterior leaflet.  It was recommended that she undergo mitral valve repair.  Prior to procedure, patient had a right/left heart catheterization on 12/02/2019 that showed mild, nonobstructive CAD with 20% stenosis in the proximal RCA and distal LAD.  She was seen by Dr. Barry Dienes and underwent minimally invasive mitral valve repair, complete Maze procedure, and sternotomy with repair of left ventricular free wall laceration on 01/17/2020.  Her postop course was complicated by acute blood loss anemia requiring blood transfusions and A-fib with RVR for which she was placed on amiodarone and Lopressor and Tikosyn was discontinued.  Later, amiodarone was discontinued and her A-fib has been managed with Eliquis and metoprolol.   Per review of notes, it appears that patient has had chronic dyspnea.  Her most recent echocardiogram from 02/2022 showed EF 50-55%, moderately reduced RV systolic function.  There was interventricular septal flattening in systole consistent with RV pressure overload.  Findings were consistent with normal structure and function of mitral valve prosthesis.  Follow-up chest CT in 02/2022  revealed right middle lobe and left upper lobe nodularity suggestive of atypical infection, nodule in the left major fissure. She is being followed by pulmonology Dr. Francine Graven.   Patient was last seen by cardiology on 11/03/22. At that time, patient reported that she had frequent nosebleeds while on eliquis. It was recommended that she consider watchman procedure, but she refused. She also complained of pain in her calves and ankles. Underwent ABIs on 11/16/21 that were normal  Today, patient reports that she has been somewhat anxious and scared lately. She is undergoing workup for OSA, and her home sleep study confirmed that she has OSA. She in scheduled for a CPAP titration/in office sleep study. She is very afraid to have this study done. She is concerned that she is claustrophobic and has nose bleeds. She said that she "lives every day in fear" and is concerned that this study will push her over the edge. Reports that her breathing has been overall stable, and she has not noticed any changes since starting to see pulmonology. Cannot tell if the nebulizer treatments and inhalers are working. She continues to have some pain in her ankles, and saw a podiatrist who is getting her some inserts to her shoes. She reportedly has had 2 episodes of chest discomfort, one of which lasted all day. Chest pain was not associated with exertion and did not go away with rest. Offered a nuclear stress test, but patient declined. She would rather continue to monitor her symptoms and will let us know if she has chest pain again. She has continued to have some nosebleeds on eliquis. Gets a bit dizzy if she stands up too  quickly    ROS: Denies exertional chest pain, orthopnea, syncope. Does have stable dyspnea on exertion, ankle edema, ankle pain   Studies Reviewed: .    Cardiac Studies & Procedures   CARDIAC CATHETERIZATION  CARDIAC CATHETERIZATION 12/02/2019  Narrative  Prox RCA lesion is 20% stenosed.  Dist LAD  lesion is 20% stenosed.  1. Mild non-obstructive CAD  Recommendation: Continue planning for mitral valve repair.  Findings Coronary Findings Diagnostic  Dominance: Right  Left Anterior Descending Vessel is large. Dist LAD lesion is 20% stenosed.  Left Circumflex Vessel is small.  Right Coronary Artery Vessel is large. Prox RCA lesion is 20% stenosed.  Intervention  No interventions have been documented.     ECHOCARDIOGRAM  ECHOCARDIOGRAM COMPLETE 02/11/2022  Narrative ECHOCARDIOGRAM REPORT    Patient Name:   [name] Date of Exam: 02/11/2022 Medical Rec #:  [id number]   Height:       66.0 in Accession #:    [id number]  Weight:       115.2 lb Date of Birth:  [birth year]   BSA:          1.582 m Patient Age:    76 years    BP:           120/70 mmHg Patient Gender: F           HR:           63 bpm. Exam Location:  Outpatient  Procedure: 2D Echo, 3D Echo, Cardiac Doppler, Color Doppler and Strain Analysis  MODIFIED REPORT: This report was modified by Weston Brass MD on 02/12/2022 due to revision. Indications:     S/P mitral valve repair  History:         Patient has prior history of Echocardiogram examinations, most recent 10/22/2020. Mitral Valve Disease, Arrythmias:Atrial Fibrillation; Risk Factors:Hypertension and Former Smoker. Status post MVR complicated with LV free wall laceration requiring median sternotomy. Status post Gore-tex neochord x 12. 32 mm sorin Memo 4D prosthetic annuloplasty ring present in the mitral position. Status post MAZE procedure.  Mitral Valve: 32 mm Sorin Memo 4D prosthetic annuloplasty ring valve is present in the mitral position. Procedure Date: 01/17/20.  Sonographer:     Jeryl Columbia RDCS Referring Phys:  [id number] Kathlynn Grate [place] Diagnosing Phys: Weston Brass MD   Sonographer Comments: Image acquisition challenging due to patient body habitus and Image acquisition challenging due to respiratory  motion. IMPRESSIONS   1. Left ventricular ejection fraction, by estimation, is 50 to 55%. The left ventricle has low normal function. Left ventricular endocardial border not optimally defined to evaluate regional wall motion. Left ventricular diastolic function could not be evaluated. There is the interventricular septum is flattened in systole, consistent with right ventricular pressure overload. 2. Right ventricular systolic function is moderately reduced. The right ventricular size is mildly enlarged. There is mildly elevated pulmonary artery systolic pressure. The estimated right ventricular systolic pressure is 44.6 mmHg. 3. Right atrial size was mildly dilated. 4. The mitral valve has been repaired/replaced. Trivial mitral valve regurgitation. No evidence of mitral stenosis. The mean mitral valve gradient is 1.9 mmHg with average heart rate of 59 bpm. There is a 32 mm Sorin Memo 4D prosthetic annuloplasty ring present in the mitral position. Procedure Date: 01/17/20. Echo findings are consistent with normal structure and function of the mitral valve prosthesis. 5. The aortic valve is tricuspid. There is mild calcification of the aortic valve. There is mild thickening of the aortic valve. Aortic valve regurgitation  is mild. Aortic valve sclerosis is present, with no evidence of aortic valve stenosis. 6. The inferior vena cava is dilated in size with <50% respiratory variability, suggesting right atrial pressure of 15 mmHg.  FINDINGS Left Ventricle: Left ventricular ejection fraction, by estimation, is 50 to 55%. The left ventricle has low normal function. Left ventricular endocardial border not optimally defined to evaluate regional wall motion. Global longitudinal strain performed but not reported based on interpreter judgement due to suboptimal tracking. The left ventricular internal cavity size was normal in size. There is no left ventricular hypertrophy. Abnormal (paradoxical) septal motion  consistent with post-operative status and the interventricular septum is flattened in systole, consistent with right ventricular pressure overload. Left ventricular diastolic function could not be evaluated due to mitral valve repair. Left ventricular diastolic function could not be evaluated.  Right Ventricle: The right ventricular size is mildly enlarged. No increase in right ventricular wall thickness. Right ventricular systolic function is moderately reduced. There is mildly elevated pulmonary artery systolic pressure. The tricuspid regurgitant velocity is 2.72 m/s, and with an assumed right atrial pressure of 15 mmHg, the estimated right ventricular systolic pressure is 44.6 mmHg.  Left Atrium: Left atrial size was normal in size.  Right Atrium: Right atrial size was mildly dilated.  Pericardium: There is no evidence of pericardial effusion.  Mitral Valve: The mitral valve has been repaired/replaced. Trivial mitral valve regurgitation. There is a 32 mm Sorin Memo 4D prosthetic annuloplasty ring present in the mitral position. Procedure Date: 01/17/20. Echo findings are consistent with normal structure and function of the mitral valve prosthesis. No evidence of mitral valve stenosis. The mean mitral valve gradient is 1.9 mmHg with average heart rate of 59 bpm.  Tricuspid Valve: The tricuspid valve is normal in structure. Tricuspid valve regurgitation is trivial. No evidence of tricuspid stenosis.  Aortic Valve: The aortic valve is tricuspid. There is mild calcification of the aortic valve. There is mild thickening of the aortic valve. Aortic valve regurgitation is mild. Aortic regurgitation PHT measures 624 msec. Aortic valve sclerosis is present, with no evidence of aortic valve stenosis.  Pulmonic Valve: The pulmonic valve was normal in structure. Pulmonic valve regurgitation is mild to moderate. No evidence of pulmonic stenosis.  Aorta: The aortic root is normal in size and  structure.  Venous: The inferior vena cava is dilated in size with less than 50% respiratory variability, suggesting right atrial pressure of 15 mmHg.  IAS/Shunts: No atrial level shunt detected by color flow Doppler.   LEFT VENTRICLE PLAX 2D LVIDd:         3.54 cm   Diastology LVIDs:         2.12 cm   LV e' medial:    3.09 cm/s LV PW:         1.00 cm   LV E/e' medial:  40.1 LV IVS:        0.96 cm   LV e' lateral:   5.63 cm/s LVOT diam:     1.90 cm   LV E/e' lateral: 22.0 LV SV:         45 LV SV Index:   28 LVOT Area:     2.84 cm   RIGHT VENTRICLE RV Basal diam:  4.61 cm RV Mid diam:    3.43 cm TAPSE (M-mode): 0.9 cm  LEFT ATRIUM             Index        RIGHT ATRIUM  Index LA diam:        3.00 cm 1.90 cm/m   RA Area:     23.20 cm LA Vol (A2C):   28.6 ml 18.08 ml/m  RA Volume:   81.00 ml  51.20 ml/m LA Vol (A4C):   27.5 ml 17.38 ml/m LA Biplane Vol: 28.9 ml 18.27 ml/m AORTIC VALVE LVOT Vmax:         72.20 cm/s LVOT Vmean:        48.800 cm/s LVOT VTI:          0.158 m AI PHT:            624 msec AR Vena Contracta: 0.37 cm  AORTA Ao Root diam: 2.80 cm Ao Asc diam:  3.00 cm  MITRAL VALVE                TRICUSPID VALVE MV Area (PHT): 3.37 cm     TR Peak grad:   29.6 mmHg MV Mean grad:  1.9 mmHg     TR Vmax:        272.00 cm/s MV Decel Time: 225 msec MV E velocity: 124.00 cm/s  SHUNTS MV A velocity: 37.70 cm/s   Systemic VTI:  0.16 m MV E/A ratio:  3.29         Systemic Diam: 1.90 cm  Weston Brass MD Electronically signed by Weston Brass MD Signature Date/Time: 02/12/2022/11:50:21 AM    Final (Updated)   TEE  ECHO TEE 01/26/2020  Narrative TRANSESOPHOGEAL ECHO REPORT    Patient Name:   [name] Date of Exam: 01/26/2020 Medical Rec #:  [id number]   Height:       66.0 in Accession #:    [id number]  Weight:       136.7 lb Date of Birth:  1946-02-18   BSA:          1.701 m Patient Age:    74 years    BP:           118/66 mmHg Patient  Gender: F           HR:           123 bpm. Exam Location:  Inpatient  Procedure: TEE-Intraopertive and 3D Echo  Indications:     atrial fibrillation  History:         Patient has prior history of Echocardiogram examinations, most recent 11/21/2019. Arrythmias:Atrial Fibrillation.  Mitral Valve: 32 mm prosthetic annuloplasty ring valve is present in the mitral position. Procedure Date: 01/17/2020.  Sonographer:     Delcie Roch Referring Phys:  [id number] Graciella Freer Diagnosing Phys: Lennie Odor MD  PROCEDURE: After discussion of the risks and benefits of a TEE, an informed consent was obtained from the patient. TEE procedure time was 20 minutes. The transesophogeal probe was passed without difficulty through the esophogus of the patient. Imaged were obtained with the patient in a left lateral decubitus position. Local oropharyngeal anesthetic was provided with Cetacaine. Sedation performed by different physician. The patient was monitored while under deep sedation. Anesthestetic sedation was provided intravenously by Anesthesiology: 270mg  of Propofol. Image quality was excellent. The patient's vital signs; including heart rate, blood pressure, and oxygen saturation; remained stable throughout the procedure. The patient developed no complications during the procedure. A successful direct current cardioversion was performed at 200 joules with 1 attempt.  IMPRESSIONS   1. Left ventricular ejection fraction, by estimation, is 55 to 60%. The left ventricle has normal function. The left ventricle has  no regional wall motion abnormalities. 2. Right ventricular systolic function is normal. The right ventricular size is normal. 3. S/p surgical LAA clipping. No residual connection noted with the LA. No LA thrombus. No left atrial/left atrial appendage thrombus was detected. 4. 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 No residual MR. MVA by direct 3D MPR assessment 2.6 cm2. The  mitral valve has been repaired/replaced. No evidence of mitral valve regurgitation. No evidence of mitral stenosis. The mean mitral valve gradient is 4.0 mmHg with average heart rate of 111 bpm. There is a 32 mm prosthetic annuloplasty ring present in the mitral position. Procedure Date: 01/17/2020. 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 6. The aortic valve is tricuspid. Aortic valve regurgitation is not visualized. No aortic stenosis is present.  Conclusion(s)/Recommendation(s): No LA/LAA thrombus identified. Successful cardioversion performed with restoration of normal sinus rhythm.  FINDINGS Left Ventricle: Left ventricular ejection fraction, by estimation, is 55 to 60%. The left ventricle has normal function. The left ventricle has no regional wall motion abnormalities. The left ventricular internal cavity size was normal in size. There is no left ventricular hypertrophy.  Right Ventricle: The right ventricular size is normal. No increase in right ventricular wall thickness. Right ventricular systolic function is normal.  Left Atrium: S/p surgical LAA clipping. No residual connection noted with the LA. No LA thrombus. Left atrial size was normal in size. No left atrial/left atrial appendage thrombus was detected.  Right Atrium: Right atrial size was normal in size.  Pericardium: Trivial pericardial effusion is present.  Mitral Valve: S/p MV repair with 32 mm annuloplasty ring. No residual MR. MVA by direct 3D MPR assessment 2.6 cm2. The mitral valve has been repaired/replaced. No evidence of mitral valve regurgitation. There is a 32 mm prosthetic annuloplasty ring present in the mitral position. Procedure Date: 01/17/2020. No evidence of mitral valve stenosis. The mean mitral valve gradient is 4.0 mmHg with average heart rate of 111 bpm.  Tricuspid Valve: The tricuspid valve is myxomatous. Tricuspid valve regurgitation is mild . No evidence of tricuspid stenosis.  Aortic Valve: The aortic  valve is tricuspid. Aortic valve regurgitation is not visualized. No aortic stenosis is present.  Pulmonic Valve: The pulmonic valve was grossly normal. Pulmonic valve regurgitation is mild. No evidence of pulmonic stenosis.  Aorta: The aortic root, ascending aorta, aortic arch and descending aorta are all structurally normal, with no evidence of dilitation or obstruction.  Venous: The left upper pulmonary vein, left lower pulmonary vein and right upper pulmonary vein are normal.  IAS/Shunts: There is redundancy of the interatrial septum. No atrial level shunt detected by color flow Doppler.  Additional Comments: There is a small pleural effusion in the left lateral region.    AORTA Ao Root diam: 3.20 cm Ao Asc diam:  2.80 cm  MITRAL VALVE           TRICUSPID VALVE MV Mean grad: 4.0 mmHg TR Peak grad:   25.0 mmHg TR Vmax:        250.00 cm/s  Lennie Odor MD Electronically signed by Lennie Odor MD Signature Date/Time: 01/26/2020/11:22:07 AM    Final   MONITORS  LONG TERM MONITOR (3-14 DAYS) 06/06/2019  Narrative Max 226 bpm 06:22pm, 11/15 Min 54 bpm 12:59am, 11/15 Avg 76 bpm Sinus rhythm with occasional runs of SVT, longest 15 seconds at 123 bmp Rare PVCs, 2.2% PACs Zero atrial fibrillation noted Patient trigger associated with sinus rhythm, rate 87  Will Camnitz, MD  Risk Assessment/Calculations:    CHA2DS2-VASc Score = 6   This indicates a 9.7% annual risk of stroke. The patient's score is based upon: CHF History: 1 HTN History: 1 Diabetes History: 0 Stroke History: 0 Vascular Disease History: 1 Age Score: 2 Gender Score: 1   Physical Exam:   VS:  BP 100/60   Pulse 69   Ht 5\' 6"  (1.676 m)   Wt 114 lb 3.2 oz (51.8 kg)   SpO2 93%   BMI 18.43 kg/m    Wt Readings from Last 3 Encounters:  01/23/23 114 lb 3.2 oz (51.8 kg)  12/23/22 110 lb 6.4 oz (50.1 kg)  11/03/22 112 lb 6.4 oz (51 kg)    GEN: Well nourished, well developed in no acute  distress. Appears emotionally anxious  NECK: No JVD; No carotid bruits CARDIAC: RRR, no murmurs, rubs, gallops. Radial pulses 2+ bilaterally  RESPIRATORY:  Clear to auscultation without rales, wheezing or rhonchi. Normal work of breathing on room air  ABDOMEN: Soft, non-tender, non-distended EXTREMITIES:  1+ edema in BLE; No deformity   ASSESSMENT AND PLAN: .    Chronic HFpEF Chronic Dyspnea  Ankle Edema  - Most recent echocardiogram from 02/2022 showed EF 50-55%, moderately reduced RV systolic function. The interventicular septum is flatted in systole consistent with RV pressure overload - She has been on lasix 40 mg and 60 mg alternating every other day  - Creatinine 0.85 and K 4.7 in 10/2022  - Patient is followed by pulmonology - has underlying bonchiectasis and was recently started on yupelri neb. Also has albuterol inhalers. Pulm suspects that her shortness of breath is multifactorial from moderate restriction on PFTs, bronchiectasis, significant deconditioning from chronic back pain  - Patient overall euvolemic on exam. Does have 1+ ankle edema on exam today. Checking BNP and BMP - Continue lasix 40 mg and 60 mg alternating every other day. If BNP elevated, can increase short term  - Instructed her to wear compression stockings. This has worked well for her ankle edema in the past. Also discussed low-sodium diets  - Repeating echo to follow mitral valve- this will also assess EF   Chest pain, atypical  - Patient reports having 2 episodes of chest pain in the past several weeks. Pain was not associated with exertion or relieved by rest, sounds atypical. Does not have chest pain when walking around/on exertion  - Most recent ischemic evaluation was  a cath in 2021 that showed mild nonobstructive CAD  - Offered nuclear stress test, but patient not interested at this time. She prefers to monitor her symptoms and plans to keep a notebook of any further episodes to discuss with Dr. Jens Som    OSA  - Patient followed by pulmonology and was found to have nocturnal hypoxemia on overnight oximetry test in 09/2022  - Patient is scheduled for CPAP titration, but she is very anxious and frightened about this study. I reassured her that the study is nothing to be anxious about, and could help improve her breathing. Patient is considering study  Leg Pain  - when seen in 10/2022, patient complained of pain/burning in both calves that is worse when walking  - ABIs were normal  - Patient recently saw a podiatrist and is going to start wearing insoles   HTN  - BP well controlled on metoprolol tartrate 25 mg BID - occasionally gets dizzy if she stands up too quickly. Instructed her to increase her water intake and to start to wear compression stockings.  Also encouraged her to make position changes slowly   S/p mitral valve repair  - Echocardiogram from 02/2022 was consistent with normal structure and function of mitral valve prosthesis  - Continue SBE prophylaxis  - Ordered echocardiogram for monitoring   Paroxysmal Atrial Fibrillation - Patient has normal heart rate and rhythm today on exam- likely maintaining NSR  - Continue metoprolol tartrate 25 mg BID, eliquis 5 mg BID  - In the past, patient reported having nose bleeds on eliquis. Offered watchman, but she refused   Dispo: Patient has an appointment with Dr. Jens Som on 9/4  Signed, Jonita Albee, PA-C

## 2023-01-22 DIAGNOSIS — R0602 Shortness of breath: Secondary | ICD-10-CM | POA: Diagnosis not present

## 2023-01-22 DIAGNOSIS — R609 Edema, unspecified: Secondary | ICD-10-CM | POA: Diagnosis not present

## 2023-01-22 DIAGNOSIS — I5031 Acute diastolic (congestive) heart failure: Secondary | ICD-10-CM | POA: Diagnosis not present

## 2023-01-23 ENCOUNTER — Encounter: Payer: Self-pay | Admitting: Cardiology

## 2023-01-23 ENCOUNTER — Ambulatory Visit: Payer: Medicare Other | Admitting: Cardiology

## 2023-01-23 ENCOUNTER — Other Ambulatory Visit: Payer: Self-pay

## 2023-01-23 VITALS — BP 100/60 | HR 69 | Ht 66.0 in | Wt 114.2 lb

## 2023-01-23 DIAGNOSIS — R0789 Other chest pain: Secondary | ICD-10-CM | POA: Diagnosis not present

## 2023-01-23 DIAGNOSIS — R0602 Shortness of breath: Secondary | ICD-10-CM | POA: Diagnosis not present

## 2023-01-23 DIAGNOSIS — Z9889 Other specified postprocedural states: Secondary | ICD-10-CM | POA: Diagnosis not present

## 2023-01-23 DIAGNOSIS — I10 Essential (primary) hypertension: Secondary | ICD-10-CM

## 2023-01-23 DIAGNOSIS — G4733 Obstructive sleep apnea (adult) (pediatric): Secondary | ICD-10-CM | POA: Insufficient documentation

## 2023-01-23 DIAGNOSIS — I739 Peripheral vascular disease, unspecified: Secondary | ICD-10-CM | POA: Insufficient documentation

## 2023-01-23 DIAGNOSIS — I5032 Chronic diastolic (congestive) heart failure: Secondary | ICD-10-CM | POA: Insufficient documentation

## 2023-01-23 DIAGNOSIS — R609 Edema, unspecified: Secondary | ICD-10-CM | POA: Diagnosis not present

## 2023-01-23 DIAGNOSIS — I4891 Unspecified atrial fibrillation: Secondary | ICD-10-CM

## 2023-01-23 NOTE — Patient Instructions (Signed)
Medication Instructions:  Your physician recommends that you continue on your current medications as directed. Please refer to the Current Medication list given to you today.  *If you need a refill on your cardiac medications before your next appointment, please call your pharmacy*   Lab Work: BMP & B MET If you have labs (blood work) drawn today and your tests are completely normal, you will receive your results only by: MyChart Message (if you have MyChart) OR A paper copy in the mail If you have any lab test that is abnormal or we need to change your treatment, we will call you to review the results.   Testing/Procedures: Your physician has requested that you have an echocardiogram. Echocardiography is a painless test that uses sound waves to create images of your heart. It provides your doctor with information about the size and shape of your heart and how well your heart's chambers and valves are working. This procedure takes approximately one hour. There are no restrictions for this procedure. Please do NOT wear cologne, perfume, aftershave, or lotions (deodorant is allowed). Please arrive 15 minutes prior to your appointment time.    Follow-Up: At [facility], you and your health needs are our priority.  As part of our continuing mission to provide you with exceptional heart care, we have created designated Provider Care Teams.  These Care Teams include your primary Cardiologist (physician) and Advanced Practice Providers (APPs -  Physician Assistants and Nurse Practitioners) who all work together to provide you with the care you need, when you need it.  We recommend signing up for the patient portal called "MyChart".  Sign up information is provided on this After Visit Summary.  MyChart is used to connect with patients for Virtual Visits (Telemedicine).  Patients are able to view lab/test results, encounter notes, upcoming appointments, etc.  Non-urgent messages can be sent to  your provider as well.   To learn more about what you can do with MyChart, go to [email or web address].    Your next appointment:   Keep follow up  Provider:   Olga Millers, MD

## 2023-01-24 DIAGNOSIS — I10 Essential (primary) hypertension: Secondary | ICD-10-CM | POA: Diagnosis not present

## 2023-01-24 DIAGNOSIS — I499 Cardiac arrhythmia, unspecified: Secondary | ICD-10-CM | POA: Diagnosis not present

## 2023-01-24 LAB — BASIC METABOLIC PANEL
BUN/Creatinine Ratio: 27 (ref 12–28)
BUN: 23 mg/dL (ref 8–27)
CO2: 33 mmol/L — ABNORMAL HIGH (ref 20–29)
Calcium: 9.1 mg/dL (ref 8.7–10.3)
Chloride: 101 mmol/L (ref 96–106)
Creatinine, Ser: 0.85 mg/dL (ref 0.57–1.00)
Glucose: 75 mg/dL (ref 70–99)
Potassium: 4.9 mmol/L (ref 3.5–5.2)
Sodium: 145 mmol/L — ABNORMAL HIGH (ref 134–144)
eGFR: 70 mL/min/{1.73_m2} (ref >59)

## 2023-01-24 LAB — BRAIN NATRIURETIC PEPTIDE: BNP: 253.3 pg/mL — ABNORMAL HIGH (ref 0.0–100.0)

## 2023-01-26 ENCOUNTER — Telehealth: Payer: Self-pay

## 2023-01-26 ENCOUNTER — Other Ambulatory Visit: Payer: Self-pay | Admitting: Family Medicine

## 2023-01-26 NOTE — Telephone Encounter (Signed)
Klonopin 0.5 mg LOV: 10/27/22 Last Refill:12/26/22 Upcoming appt:none

## 2023-01-26 NOTE — Telephone Encounter (Signed)
-----   Message from Jonita Albee sent at 01/26/2023  7:19 AM EDT ----- Please tell patient that her BNP is mildly elevated, which means she has a little extra fluid on her. To help get rid of some of this fluid, she should increase her lasix to 80 mg for the next 3 days (Monday, Tuesday, and Wednesday). Needs repeat BMP on Thursday. After 3 days, she should plan to resume her current lasix schedule  Thanks,  KJ

## 2023-01-26 NOTE — Telephone Encounter (Signed)
Left voicemail to return call to office.

## 2023-01-27 ENCOUNTER — Ambulatory Visit (HOSPITAL_COMMUNITY)
Admission: RE | Admit: 2023-01-27 | Discharge: 2023-01-27 | Disposition: A | Discharge status: Home / Self Care | Payer: Medicare Other | Source: Ambulatory Visit | Attending: Cardiology | Admitting: Cardiology

## 2023-01-27 DIAGNOSIS — R0602 Shortness of breath: Secondary | ICD-10-CM | POA: Diagnosis not present

## 2023-01-27 DIAGNOSIS — I082 Rheumatic disorders of both aortic and tricuspid valves: Secondary | ICD-10-CM | POA: Insufficient documentation

## 2023-01-27 DIAGNOSIS — Z9889 Other specified postprocedural states: Secondary | ICD-10-CM | POA: Diagnosis not present

## 2023-01-27 NOTE — Progress Notes (Signed)
Echocardiogram 2D Echocardiogram has been performed.  Augustine Radar 01/27/2023, 2:53 PM

## 2023-01-27 NOTE — Telephone Encounter (Signed)
Pt aware of refill.

## 2023-01-28 LAB — ECHOCARDIOGRAM COMPLETE
AV Vena cont: 0.3 cm
Area-P 1/2: 2.89 cm2
Calc EF: 62.7 %
MV VTI: 1.19 cm2
S' Lateral: 2.1 cm
Single Plane A2C EF: 61.3 %
Single Plane A4C EF: 66.8 %

## 2023-02-02 ENCOUNTER — Encounter: Payer: Self-pay | Admitting: Family Medicine

## 2023-02-02 ENCOUNTER — Ambulatory Visit (INDEPENDENT_AMBULATORY_CARE_PROVIDER_SITE_OTHER): Payer: Medicare Other | Admitting: Family Medicine

## 2023-02-02 VITALS — BP 118/72 | HR 75 | Temp 98.8°F | Resp 16 | Ht 66.0 in | Wt 112.0 lb

## 2023-02-02 DIAGNOSIS — F32A Depression, unspecified: Secondary | ICD-10-CM | POA: Diagnosis not present

## 2023-02-02 DIAGNOSIS — R110 Nausea: Secondary | ICD-10-CM

## 2023-02-02 DIAGNOSIS — F419 Anxiety disorder, unspecified: Secondary | ICD-10-CM

## 2023-02-02 MED ORDER — ONDANSETRON HCL 4 MG PO TABS
4.0000 mg | ORAL_TABLET | Freq: Three times a day (TID) | ORAL | 1 refills | Status: DC | PRN
Start: 2023-02-02 — End: 2023-07-24

## 2023-02-02 MED ORDER — MECLIZINE HCL 25 MG PO TABS: ORAL_TABLET | ORAL | 1 refills | Status: DC

## 2023-02-02 NOTE — Patient Instructions (Addendum)
Follow up as needed or as scheduled Use the Ondansetron (Zofran) as needed for nausea Use the Meclizine as needed for dizziness When you use your inhalers, you want the medicine to come IN (inhale) Ask home health to speak with one of their social workers or case managers regarding living options Call with any questions or concerns Stay Safe!  Stay Healthy! Hang in there!!

## 2023-02-02 NOTE — Progress Notes (Unsigned)
   Subjective:    Patient ID: [name], female    DOB: 01-22-1946, 77 y.o.   MRN: [id number]  HPI Nausea- pt reports she is out of Zofran and needs a refill.  Isn't sure if the nausea is related to her pulmonary medications- albuterol, revefenacin.  Also has dizziness which can cause nausea.  Asking for refill on Meclizine.  Depression/anxiety- pt reports she is very concerned about future living options.  She states she will not be able to afford a facility but knows she will have a hard time continuing to live alone.    Review of Systems For ROS see HPI     Objective:   Physical Exam Vitals reviewed.  Constitutional:      General: She is not in acute distress.    Comments: frail  HENT:     Head: Normocephalic and atraumatic.  Eyes:     Extraocular Movements: Extraocular movements intact.     Conjunctiva/sclera: Conjunctivae normal.  Cardiovascular:     Rate and Rhythm: Normal rate.  Pulmonary:     Effort: Pulmonary effort is normal. No respiratory distress.  Skin:    General: Skin is warm and dry.  Neurological:     Mental Status: She is alert and oriented to person, place, and time. Mental status is at baseline.  Psychiatric:     Comments: anxious           Assessment & Plan:  Nausea- new.  Pt is not sure if this is a side effect of her pulmonary medication or due to her dizziness.  Will refill Zofran to use as needed and meclizine to help w/ her dizziness.  She plans to discuss medications w/ pulmonary.

## 2023-02-03 ENCOUNTER — Telehealth: Payer: Self-pay | Admitting: Cardiology

## 2023-02-03 NOTE — Telephone Encounter (Signed)
Spoke with pt, questions regarding echo answered. She would like for dr Jens Som to look at the echo results and let her know if there is anything to worry about. Aware he is out of the office but I will call her back once reviewed.

## 2023-02-03 NOTE — Telephone Encounter (Signed)
Patient is calling in about his echo results. Please advise

## 2023-02-03 NOTE — Telephone Encounter (Signed)
Called pt and she stated she has other major health concerns, she did not want to disclose, that are preventing her from being able to complete an in lab sleep study at this time. She said she will reschedule once she's well and able.   Wanted to keep her appt with JD on 8/13

## 2023-02-03 NOTE — Telephone Encounter (Signed)
Returned pt call in regards to the Echo. Gave the results to the pt. But she wants to speak with "Stanton Kidney" as she would feel better talking to her because she has spoke with her before.

## 2023-02-04 ENCOUNTER — Encounter: Payer: Self-pay | Admitting: Family Medicine

## 2023-02-04 ENCOUNTER — Telehealth: Payer: Self-pay | Admitting: Pulmonary Disease

## 2023-02-04 ENCOUNTER — Telehealth: Payer: Self-pay | Admitting: Podiatry

## 2023-02-04 DIAGNOSIS — I499 Cardiac arrhythmia, unspecified: Secondary | ICD-10-CM | POA: Diagnosis not present

## 2023-02-04 DIAGNOSIS — I10 Essential (primary) hypertension: Secondary | ICD-10-CM | POA: Diagnosis not present

## 2023-02-04 NOTE — Telephone Encounter (Signed)
Pt called because she has not heard back about her insurance covering the orthotics.  I explained that orthotics are not covered by medicare because they do not meet criteria to be covered. Medicare reqires them to be attached to a shoe and ours do not they are removable.   I did tell pt that you also recommended the over the counter inserts called powersteps.

## 2023-02-04 NOTE — Telephone Encounter (Signed)
Pt. Returning the nurses call

## 2023-02-04 NOTE — Telephone Encounter (Signed)
ATC X1 LVM for patient to call the office back 

## 2023-02-04 NOTE — Assessment & Plan Note (Signed)
Ongoing issue for pt.  Typically centers around her health issues and not wanting to die.  Today anxiety is due to lack of future living options.  Cannot afford assisted living but knows she will not be able to live alone for much longer.  Son is of no help and sisters already have arrangements.  As pt already has home health, I encouraged her to ask them to speak to one of their social workers or case managers.  Pt expressed understanding and is in agreement w/ plan.

## 2023-02-04 NOTE — Telephone Encounter (Signed)
Spoke with pt, aware of dr Ludwig Clarks comments.

## 2023-02-05 NOTE — Telephone Encounter (Addendum)
I spoke with the pt  She states that she has cancelled her CPAP titration study due to fear of wearing the CPAP  She has upcoming appt with Dr. Francine Graven on 02/17/23  I encouraged her to keep this appt to discuss further  She verbalized understanding  Nothing further needed

## 2023-02-11 ENCOUNTER — Encounter (HOSPITAL_BASED_OUTPATIENT_CLINIC_OR_DEPARTMENT_OTHER): Payer: Medicare Other | Admitting: Pulmonary Disease

## 2023-02-17 ENCOUNTER — Encounter: Payer: Self-pay | Admitting: Pulmonary Disease

## 2023-02-17 ENCOUNTER — Ambulatory Visit: Payer: Medicare Other | Admitting: Pulmonary Disease

## 2023-02-17 VITALS — BP 122/68 | HR 78 | Temp 97.3°F | Ht 66.0 in | Wt 111.0 lb

## 2023-02-17 DIAGNOSIS — J479 Bronchiectasis, uncomplicated: Secondary | ICD-10-CM

## 2023-02-17 DIAGNOSIS — G4733 Obstructive sleep apnea (adult) (pediatric): Secondary | ICD-10-CM

## 2023-02-17 NOTE — Progress Notes (Signed)
Synopsis: Referred in August 2023 for abnormal CT Chest scan  Subjective:   PATIENT ID: [name] GENDER: female DOB: [birth year], MRN: [id number]  HPI  Chief Complaint  Patient presents with   Follow-up    SOB    [name] is a 77 year old woman with history of mitral regurgitation s/p minimally invassive MV repair and MAZE procedure complicated by LV free wall rupture 10/2019 and scoliosis with 7 spinal surgeries in 2015 who returns to pulmonary clinic for shortness of breath.   She canceled her in lab CPAP titration study due to panic/anxiety.  She expresses concern for her debilitating anxiety/depression and feels that it is worse than ever before.  She has been using Yupelri nebulizer treatments as needed for her shortness of breath.   OV 09/23/22 She was started on yupelri nebulizer treatments at last visit in attempt to help with her dyspnea given significant bronchodilator response on PFTs. She noted an improvement in her breathing with the nebulizer treatment.   ONO showed 1 hr 51 minutes with SpO2 less than 88%. Her oxygen desaturation index was 37 events per hour. Studies have shown that the ODI is a high predictor of sleep disordered breathing such as sleep apnea. Given her medications and these results, I recommended she be scheduled for an in lab sleep study for further evaluation.   She had issue of epistaxis that she went to the ER on 08/18/22 and had packing in for about 4-5 days. This occurred 2-3 weeks after starting the nebulizer treatments. She has had issues with epistaxis in the past.   OV 07/24/22 She continues to have shortness of breath and she feels it is getting worse. She did not tolerate Stiolto inhaler after last visit due to feeling like she had mucous in her throat. She continues to have issues with swallowing and has been referred to speech therapy by her primary care team.  OV 04/02/22 She did not tolerate stiolto after last visit as she reports choking  issues after using it.  She had modified barium swallow 03/11/22 with no overt issues of dysphagia or aspiration risk. She reported taking TUMS to the speech therapist for chest pains with relief. She did start pantoprazole after last visit which she reports some improvement in her reflux.  She continues to have significant chest, neck, shoulder and back pains.   She is meeting with her surgical team at [facility] and is considering botox injections in her neck.  OV 03/05/22 She reports the dyspnea can occur at rest or with exertion. She has wheezing at night sometimes. She complains of sharp chest pains that are located centrally and goes between her shoulder blades. She has terrible heartburn. She does report trouble swallowing her food/drink. She is using oxycodone every 4 hours for chronic pain due to her neck and back. She has cervical, thoracic and lumbar spine hardware in place.   PFTs 2022 showed normal spirometry but evidence of air trapping and significant bronchodilator response.   She has not noted much benefit from trying albuterol inhaler with her shortness of breath.   She is a former smoker, quit in 1980. She lives alone and is accompanied by her sister today. She is having more difficulty with her ADLs.   Past Medical History:  Diagnosis Date   Allergies    Anxiety    Arthritis    "maybe in my back" (03/31/2018)   Benign paroxysmal positional vertigo 06/08/2013   Complication of anesthesia  Fracture of multiple ribs 2015   "don't know from what; dx'd when I in hospital for 1st back OR" (03/31/2018)   GERD (gastroesophageal reflux disease)    Hair loss 04/12/2012   Herpes    History of blood transfusion    "twice; related to back OR" (03/31/2018)   History of kidney stones    Interstitial cystitis 11/06/2011   Melanoma of ankle (HCC) ~ 2003   "right"   Mitral regurgitation    Osteopenia 02/18/2012   Osteoporosis    PAF (paroxysmal atrial fibrillation) (HCC) 2012    Peripheral neuropathy 11/06/2011   PMDD (premenstrual dysphoric disorder)    PONV (postoperative nausea and vomiting)    nausea, vomiting, hives and dizziness    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/2020   Complete bilateral atrial lesion set using cryothermy and bipolar radiofrequency ablation with clipping of LA appendage via right mini-thoracotomy approach   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   Complex valvuloplasty including artificial Gore-tex neochord placement x12 with 32mm Sorin Memo 4D ring annuloplasty   Seasonal allergies    Vaginal delivery    ONE NSVD   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     Family History  Problem Relation Age of Onset   Diabetes Father    Hyperlipidemia Sister    Heart disease Sister    Stroke Sister    Diabetes Brother    Hyperlipidemia Sister    Heart disease Sister    Arthritis Mother    Heart disease Mother    Uterine cancer Mother    Diabetes Brother    Heart Problems Brother      Social History   Socioeconomic History   Marital status: Divorced    Spouse name: none/divorced   Number of children: Not on file   Years of education: Not on file   Highest education level: 12th grade  Occupational History   Occupation: retired  Tobacco Use   Smoking status: Former    Current packs/day: 0.00    Average packs/day: 0.1 packs/day for 14.0 years (1.4 ttl pk-yrs)    Types: Cigarettes    Start date: 1966    Quit date: 1980    Years since quitting: 44.6   Smokeless tobacco: Never   Tobacco comments:    03/31/2018 "quit ~ 1980; someday smoker when I did smoke; never addicted"  Vaping Use   Vaping status: Never Used  Substance and Sexual Activity   Alcohol use: Never   Drug use: Yes    Types: Oxycodone, Benzodiazepines    Comment: 03/31/2018 "for chronic neck and back pain", takes Klonopin at times.    Sexual activity: Not Currently  Other Topics Concern   Not on file  Social History Narrative   Lives by herself.    Social  Determinants of Health   Financial Resource Strain: Low Risk  (10/23/2022)   Overall Financial Resource Strain (CARDIA)    Difficulty of Paying Living Expenses: Not hard at all  Food Insecurity: No Food Insecurity (10/23/2022)   Hunger Vital Sign    Worried About Running Out of Food in the Last Year: Never true    Ran Out of Food in the Last Year: Never true  Transportation Needs: No Transportation Needs (10/23/2022)   PRAPARE - Administrator, Civil Service (Medical): No    Lack of Transportation (Non-Medical): No  Physical Activity: Inactive (10/23/2022)   Exercise Vital Sign    Days of Exercise per Week: 0 days    Minutes of  Exercise per Session: 0 min  Stress: Stress Concern Present (10/23/2022)   [employer] of Occupational Health - Occupational Stress Questionnaire    Feeling of Stress : To some extent  Social Connections: Socially Isolated (10/23/2022)   Social Connection and Isolation Panel [NHANES]    Frequency of Communication with Friends and Family: More than three times a week    Frequency of Social Gatherings with Friends and Family: Once a week    Attends Religious Services: Never    Database administrator or Organizations: No    Attends Banker Meetings: Never    Marital Status: Divorced  Catering manager Violence: Not At Risk (01/10/2023)   Received from [employer], [facility]   HITS    Over the last 12 months how often did your partner physically hurt you?: 1    Over the last 12 months how often did your partner insult you or talk down to you?: 1    Over the last 12 months how often did your partner threaten you with physical harm?: 1    Over the last 12 months how often did your partner scream or curse at you?: 1     Allergies  Allergen Reactions   Buprenorphine Nausea Only and Other (See Comments)    sedation and adhesive reaction Other reaction(s): nausea, sedation and adhesive reaction Other reaction(s): nausea, sedation and  adhesive reaction, Other (See Comments), Other (See Comments) sedation and adhesive reaction Other reaction(s): nausea, sedation and adhesive reaction sedation and adhesive reaction Other reaction(s): nausea, sedation and adhesive reaction sedation and adhesive reaction Other reaction(s): nausea, sedation and adhesive reaction sedation and adhesive reaction Other reaction(s): nausea, sedation and adhesive reaction sedation and adhesive reaction Other reaction(s): nausea, sedation and adhesive reaction sedation and adhesive reaction Other reaction(s): nausea, sedation and adhesive reaction   Erythromycin Nausea And Vomiting and Other (See Comments)    Dizziness  Other reaction(s): Other (See Comments) Dizziness  unknown unknown Dizziness  BURNING ALL OVER  BURNING ALL OVER  unknown Dizziness  BURNING ALL OVER    Iodinated Contrast Media Hives, Nausea Only and Rash    unknown unknown unknown   Latex Palpitations, Other (See Comments) and Rash     hives and sores Other reaction(s): hives and sores Other reaction(s):  hives and sores, Other (See Comments), Other (See Comments)  hives and sores Other reaction(s): hives and sores hives hives  hives and sores Other reaction(s): hives and sores hives Sores  hives and sores Other reaction(s): hives and sores hives Sores  hives and sores Other reaction(s): hives and sores hives  hives and sores Other reaction(s): hives and sores hives Sores  hives and sores Other reaction(s): hives and sores   Other Other (See Comments)    CAN NOT TAKE , aLPRAZOLAM, OR ELAVIL DUE TO AFIB FOR ANXIETY  IV CONTRAST /"DYE". Other reaction(s): hives and sores Other reaction(s): Other (See Comments), Other (See Comments) CAN NOT TAKE , aLPRAZOLAM, OR ELAVIL DUE TO AFIB FOR ANXIETY  IV CONTRAST /"DYE". Other reaction(s): hives and sores IV CONTRAST /"DYE". CAN NOT TAKE , aLPRAZOLAM, OR ELAVIL DUE TO AFIB FOR ANXIETY  IV CONTRAST  /"DYE". Other reaction(s): hives and sores Patient states that every time she has anesthesia, she wakes up vomiting, has chills, blood pressure drops CAN NOT TAKE , aLPRAZOLAM, OR ELAVIL DUE TO AFIB FOR ANXIETY  IV CONTRAST /"DYE". Other reaction(s): hives and sores IV CONTRAST /"DYE". Patient states that every time she has anesthesia,  she wakes up vomiting, has chills, blood pressure drops CAN NOT TAKE , aLPRAZOLAM, OR ELAVIL DUE TO AFIB FOR ANXIETY  IV CONTRAST /"DYE". Other reaction(s): hives and sores IV CONTRAST /"DYE". CAN NOT TAKE , aLPRAZOLAM, OR ELAVIL DUE TO AFIB FOR ANXIETY  IV CONTRAST /"DYE". Other reaction(s): hives and sores Patient states that every time she has anesthesia, she wakes up vomiting, has chills, blood pressure drops CAN NOT TAKE , aLPRAZOLAM, OR ELAVIL DUE TO AFIB FOR ANXIETY  IV CONTRAST /"DYE". Other reaction(s): hives and sores   Tape Rash, Other (See Comments) and Itching    Heart monitor stickers must be rotated in order to prevent rash Other reaction(s): hives and sores Adhesive on EKG tabs=BURNS SKIN**PAPER TAPE OK** sores Adhesive on EKG tabs. Per Pt, Ok w/Paper Tape Adhesive on EKG tabs=BURNS SKIN**PAPER TAPE OK** sores Other reaction(s): Other (See Comments) Heart monitor stickers must be rotated in order to prevent rash Adhesive on EKG tabs=BURNS SKIN**PAPER TAPE OK** sores   Ciprofloxacin Other (See Comments)    Other reaction(s): Other (See Comments) Burning all over Burning all over Burning all over   Duloxetine Hcl     Severe diarrhea and upset stomach Other reaction(s): Other (See Comments), Unknown Severe diarrhea and upset stomach Severe diarrhea and upset stomach Severe diarrhea and upset stomach   Erythromycin Base     Other reaction(s): Other (See Comments), Unknown   Hydrocodone Other (See Comments)    Sedation,dizziness, and nausea Other reaction(s): sedation and nausea Other reaction(s): sedation and  nausea Other reaction(s): Other (See Comments), Other (See Comments), sedation and nausea Sedation,dizziness, and nausea Other reaction(s): sedation and nausea Other reaction(s): sedation and nausea Sedation,dizziness, and nausea Other reaction(s): sedation and nausea Other reaction(s): sedation and nausea Sedation,dizziness, and nausea Other reaction(s): sedation and nausea Other reaction(s): sedation and nausea Sedation,dizziness, and nausea Other reaction(s): sedation and nausea Other reaction(s): sedation and nausea Sedation,dizziness, and nausea Other reaction(s): sedation and nausea Other reaction(s): sedation and nausea Sedation,dizziness, and nausea Other reaction(s): sedation and nausea Other reaction(s): sedation and nausea   Hydromorphone Other (See Comments)    Sedation,dizziness, and nausea Other reaction(s): sedation and nausea Other reaction(s): sedation and nausea Other reaction(s): Other (See Comments), Other (See Comments), sedation and nausea Sedation,dizziness, and nausea Other reaction(s): sedation and nausea Other reaction(s): sedation and nausea Sedation,dizziness, and nausea Other reaction(s): sedation and nausea Other reaction(s): sedation and nausea Sedation,dizziness, and nausea Other reaction(s): sedation and nausea Other reaction(s): sedation and nausea Sedation,dizziness, and nausea Other reaction(s): sedation and nausea Other reaction(s): sedation and nausea Sedation,dizziness, and nausea Other reaction(s): sedation and nausea Other reaction(s): sedation and nausea Sedation,dizziness, and nausea Other reaction(s): sedation and nausea Other reaction(s): sedation and nausea   Iodine     unknown Other reaction(s): Other (See Comments) unknown unknown unknown unknown unknown unknown   Levofloxacin Nausea And Vomiting and Nausea Only   Metrizamide Hives   Nsaids     CANNOT TAKE PER CARDIOLOGIST DUE TO AFIB    Nucynta [Tapentadol Hcl]      nausea and sedation   Oxycodone Other (See Comments)    Delusions (intolerance)  PILLS ONLY sedation, dizziness, nausea,  and itching Other reaction(s): sedation and nausea Other reaction(s): sedation and nausea Other reaction(s): Hallucinations, Other Other reaction(s): Other (See Comments), sedation and nausea Delusions (intolerance)  PILLS ONLY sedation, dizziness, nausea,  and itching Other reaction(s): sedation and nausea Other reaction(s): sedation and nausea Delusions (intolerance)  PILLS ONLY sedation, dizziness, nausea,  and itching Other reaction(s): sedation  and nausea Other reaction(s): sedation and nausea Delusions (intolerance)  PILLS ONLY sedation, dizziness, nausea,  and itching Other reaction(s): sedation and nausea Other reaction(s): sedation and nausea Delusions (intolerance)  PILLS ONLY sedation, dizziness, nausea,  and itching Other reaction(s): sedation and nausea Other reaction(s): sedation and nausea Delusions (intolerance)  PILLS ONLY sedation, dizziness, nausea,  and itching Other reaction(s): sedation and nausea Other reaction(s): sedation and nausea Delusions (intolerance)  PILLS ONLY sedation, dizziness, nausea,  and itching Other reaction(s): sedation and nausea Other reaction(s): sedation and nausea   Pentazocine Other (See Comments)    Unknown Other reaction(s): Unknown Other reaction(s): Unknown Other reaction(s): Other (See Comments), Other (See Comments), Unknown Unknown Other reaction(s): Unknown Other reaction(s): Unknown Unknown Other reaction(s): Unknown Other reaction(s): Unknown Unknown Other reaction(s): Unknown Other reaction(s): Unknown Unknown Other reaction(s): Unknown Other reaction(s): Unknown Unknown Other reaction(s): Unknown Other reaction(s): Unknown Unknown Other reaction(s): Unknown Other reaction(s): Unknown   Septra [Bactrim]     Hives    Sulfasalazine Hives   Morphine Anxiety, Other (See Comments) and  Nausea Only    sedation and nausea Other reaction(s): sedation and nausea Other reaction(s): sedation and nausea Other reaction(s): Delusions (intolerance), Other (See Comments), Other (See Comments), sedation and nausea sedation and nausea Other reaction(s): sedation and nausea Other reaction(s): sedation and nausea sedation and nausea Other reaction(s): sedation and nausea Other reaction(s): sedation and nausea PT CANNOT WAKE UP AFTER TAKING MEDICATION AND HAS NIGHTMARES  sedation and nausea Other reaction(s): sedation and nausea Other reaction(s): sedation and nausea PT CANNOT WAKE UP AFTER TAKING MEDICATION AND HAS NIGHTMARES  sedation and nausea Other reaction(s): sedation and nausea Other reaction(s): sedation and nausea sedation and nausea Other reaction(s): sedation and nausea Other reaction(s): sedation and nausea PT CANNOT WAKE UP AFTER TAKING MEDICATION AND HAS NIGHTMARES  sedation and nausea Other reaction(s): sedation and nausea Other reaction(s): sedation and nausea   Sulfa Antibiotics Hives, Nausea Only and Rash    rash rash rash   Sulfamethoxazole-Trimethoprim Rash    Hives  Hives  Hives  Hives  Hives  Hives    Tapentadol Nausea Only    Other reaction(s): nausea and sedation, Other (See Comments) nausea and sedation nausea and sedation     Outpatient Medications Prior to Visit  Medication Sig Dispense Refill   acetaminophen (TYLENOL) 500 MG tablet Take 500 mg by mouth every 6 (six) hours as needed for moderate pain.      albuterol (VENTOLIN HFA) 108 (90 Base) MCG/ACT inhaler Inhale 1-2 puffs into the lungs every 6 (six) hours as needed. 8 g 2   apixaban (ELIQUIS) 5 MG TABS tablet Take 1 tablet (5 mg total) by mouth 2 (two) times daily. 60 tablet 5   b complex vitamins tablet Take 1 tablet by mouth daily.     Calcium Carbonate Antacid (TUMS CHEWY BITES PO) Take 1 tablet by mouth daily as needed (reflux).      Calcium Citrate-Vitamin D (CALCIUM + D PO)  Take 1 tablet by mouth daily.     Carboxymethylcellul-Glycerin (LUBRICATING EYE DROPS OP) Place 1 drop into both eyes daily as needed (dry eyes).     cephALEXin (KEFLEX) 500 MG capsule Take 2 grams (4 tablets) by mouth 1 hour prior to dental cleaning/procedure. 4 capsule 2   clonazePAM (KLONOPIN) 0.5 MG tablet Take 1 tablet by mouth twice daily as needed for anxiety 30 tablet 3   denosumab (PROLIA) 60 MG/ML SOSY injection Inject 60 mg into the skin every 6 (six) months.  furosemide (LASIX) 40 MG tablet Take 1 tablet (40 MG) by mouth on Monday, Wednesday, Friday and Sunday. 90 tablet 3   furosemide (LASIX) 40 MG tablet Take 1.5 tablets (60 mg total) by mouth as directed. Take 1.5 tablet by mouth on Tuesday, Thursday, and Saturday. 90 tablet 3   levothyroxine (SYNTHROID) 75 MCG tablet Take 1 tablet by mouth once daily 90 tablet 0   meclizine (ANTIVERT) 25 MG tablet TAKE 1 TABLET BY MOUTH THREE TIMES DAILY AS NEEDED FOR DIZZINESS 45 tablet 1   metoprolol tartrate (LOPRESSOR) 25 MG tablet Take 1 tablet (25 mg total) by mouth 2 (two) times daily. 180 tablet 3   mupirocin ointment (BACTROBAN) 2 % Apply 1 Application topically daily.     Nutritional Supplements (,FEEDING SUPPLEMENT, PROSOURCE PLUS) liquid Take 30 mLs by mouth 2 (two) times daily between meals. 887 mL 0   nystatin (MYCOSTATIN/NYSTOP) powder Apply topically 2 (two) times daily as needed (skin irritation (under breasts)). 15 g 0   ondansetron (ZOFRAN) 4 MG tablet Take 1 tablet (4 mg total) by mouth every 8 (eight) hours as needed. 45 tablet 1   oxyCODONE (ROXICODONE) 5 MG/5ML solution Take 4 mLs (4 mg total) by mouth every 4 (four) hours as needed for moderate pain.  0   pantoprazole (PROTONIX) 40 MG tablet Take 1 tablet (40 mg total) by mouth daily. 30 tablet 5   polyethylene glycol (MIRALAX / GLYCOLAX) packet Take 17 g by mouth daily as needed for mild constipation.     potassium chloride SA (KLOR-CON M) 20 MEQ tablet Take 1 tablet (20  mEq total) by mouth daily. 90 tablet 3   revefenacin (YUPELRI) 175 MCG/3ML nebulizer solution Take 3 mLs (175 mcg total) by nebulization daily. 90 mL 11   sodium chloride (OCEAN) 0.65 % SOLN nasal spray Place 1 spray into both nostrils as needed for congestion (nose bleeds).     valACYclovir (VALTREX) 500 MG tablet Take 500 mg by mouth daily as needed (breakouts).      White Petrolatum-Mineral Oil (ARTIFICIAL TEARS) ointment Place into both eyes as needed.     No facility-administered medications prior to visit.   Review of Systems  Constitutional:  Negative for chills, fever, malaise/fatigue and weight loss.  HENT:  Negative for congestion, sinus pain and sore throat.   Eyes: Negative.   Respiratory:  Positive for shortness of breath. Negative for cough, hemoptysis, sputum production and wheezing.   Cardiovascular:  Positive for chest pain. Negative for palpitations, orthopnea, claudication and leg swelling.  Gastrointestinal:  Positive for heartburn. Negative for abdominal pain, nausea and vomiting.  Genitourinary: Negative.   Musculoskeletal:  Negative for joint pain and myalgias.  Skin:  Negative for rash.  Neurological:  Negative for weakness.  Endo/Heme/Allergies: Negative.   Psychiatric/Behavioral:  The patient is nervous/anxious.    Objective:   Vitals:   02/17/23 1308  BP: 122/68  Pulse: 78  Temp: (!) 97.3 F (36.3 C)  TempSrc: Temporal  SpO2: 94%  Weight: 111 lb (50.3 kg)  Height: 5\' 6"  (1.676 m)    Physical Exam Constitutional:      General: She is not in acute distress.    Appearance: She is not ill-appearing.  HENT:     Head: Normocephalic and atraumatic.  Eyes:     General: No scleral icterus. Cardiovascular:     Rate and Rhythm: Normal rate and regular rhythm.     Pulses: Normal pulses.     Heart sounds: Normal heart sounds.  No murmur heard. Pulmonary:     Effort: Pulmonary effort is normal.     Breath sounds: Normal breath sounds. No wheezing, rhonchi  or rales.  Musculoskeletal:     Right lower leg: No edema.     Left lower leg: No edema.  Skin:    General: Skin is warm and dry.  Neurological:     General: No focal deficit present.     Mental Status: She is alert.    CBC    Component Value Date/Time   WBC 5.9 10/03/2022 1614   WBC 7.2 08/20/2022 1508   RBC 4.33 10/03/2022 1614   RBC 4.58 08/20/2022 1508   HGB 13.0 10/03/2022 1614   HCT 38.6 10/03/2022 1614   PLT 173 10/03/2022 1614   MCV 89 10/03/2022 1614   MCH 30.0 10/03/2022 1614   MCH 25.9 (L) 02/06/2020 0554   MCHC 33.7 10/03/2022 1614   MCHC 33.2 08/20/2022 1508   RDW 12.3 10/03/2022 1614   LYMPHSABS 1.8 08/20/2022 1508   MONOABS 0.7 08/20/2022 1508   EOSABS 0.2 08/20/2022 1508   BASOSABS 0.0 08/20/2022 1508      Latest Ref Rng & Units 01/22/2023   12:00 AM 11/04/2022    3:39 PM 10/03/2022    4:14 PM  BMP  Glucose 70 - 99 mg/dL 75  92  86   BUN 8 - 27 mg/dL 23  22  21    Creatinine 0.57 - 1.00 mg/dL 0.98  1.19  1.47   BUN/Creat Ratio 12 - 28 27  26  28    Sodium 134 - 144 mmol/L 145  142  142   Potassium 3.5 - 5.2 mmol/L 4.9  4.7  4.6   Chloride 96 - 106 mmol/L 101  98  96   CO2 20 - 29 mmol/L 33  32  28   Calcium 8.7 - 10.3 mg/dL 9.1  9.8  9.6    Chest imaging: CT Chest 02/13/22 1. Right middle lobe and left upper lobe tree-in-bud nodularity consistent with atypical infection. 2. Interval development of a 6x4 mm subpleural nodule along the left major fissure. No follow-up needed if patient is low-risk.This recommendation follows the consensus statement: Guidelines for Management of Incidental Pulmonary Nodules Detected on CT Images: From the Fleischner Society 2017; Radiology 2017; 284:228-243. 3. Cardiomegaly. 4. Mitral valve replacement.  PFT:    Latest Ref Rng & Units 01/01/2021   11:49 AM 12/01/2019    9:39 AM  PFT Results  FVC-Pre L 1.55  2.13   FVC-Predicted Pre % 51  69   FVC-Post L 1.67    FVC-Predicted Post % 55    Pre FEV1/FVC % % 74   74   Post FEV1/FCV % % 81    FEV1-Pre L 1.15  1.57   FEV1-Predicted Pre % 50  68   FEV1-Post L 1.36    DLCO uncorrected ml/min/mmHg 21.48  14.09   DLCO UNC% % 105  68   DLCO corrected ml/min/mmHg 22.12  14.56   DLCO COR %Predicted % 108  71   DLVA Predicted % 110  105   TLC L 4.50  5.36   TLC % Predicted % 84  100   RV % Predicted % 131  140     Labs:  Path:  Echo 02/11/22: LV EF 50-55%. RV systolic function is moderately reduced. RV size is moderately enlarged. Mildly elevated pulmonary artery systolic pressure 44.1mmHg. RA mildly dilated.  Heart Catheterization:  Assessment & Plan:  OSA (obstructive sleep apnea)  Bronchiectasis without complication (HCC)  Discussion: [name] is a 77 year old woman with history of mitral regurgitation s/p minimally invassive MV repair and MAZE procedure complicated by LV free wall rupture 10/2019 and scoliosis with 7 spinal surgeries in 2015 who returns to pulmonary clinic for shortness of breath.   Her bronchiectasis is very mild based on CT imaging. This is not the etiology of her shortness of breath. PFTs are within normal limits with positive bronchodilator response. She is to continue yupelri nebulizer treatment daily as she noted improvement in her dyspnea.   She has moderate obstructive sleep apnea based on home sleep study 01/13/2023 and was recommended for in lab CPAP titration due to her history of nocturnal hypoxemia.  She is too overwhelmed at this time due to anxiety/depression to complete the study.  This is a very complicated patient to treat due to her debilitating anxiety/depression.  We will hold off on initiating nocturnal oxygen therapy at this time given her epistaxis issues. She is to use saline nasal spray and vaseline per ENT recommendations.  She has history of complete spinal stenosis which is likely leading to some degree of restriction in her chest wall mobility that can add to her shortness of breath.   Follow up  in 1 year call sooner if needed  Melody Comas, MD [facility] Office: [phone]    Current Outpatient Medications:    acetaminophen (TYLENOL) 500 MG tablet, Take 500 mg by mouth every 6 (six) hours as needed for moderate pain. , Disp: , Rfl:    albuterol (VENTOLIN HFA) 108 (90 Base) MCG/ACT inhaler, Inhale 1-2 puffs into the lungs every 6 (six) hours as needed., Disp: 8 g, Rfl: 2   apixaban (ELIQUIS) 5 MG TABS tablet, Take 1 tablet (5 mg total) by mouth 2 (two) times daily., Disp: 60 tablet, Rfl: 5   b complex vitamins tablet, Take 1 tablet by mouth daily., Disp: , Rfl:    Calcium Carbonate Antacid (TUMS CHEWY BITES PO), Take 1 tablet by mouth daily as needed (reflux). , Disp: , Rfl:    Calcium Citrate-Vitamin D (CALCIUM + D PO), Take 1 tablet by mouth daily., Disp: , Rfl:    Carboxymethylcellul-Glycerin (LUBRICATING EYE DROPS OP), Place 1 drop into both eyes daily as needed (dry eyes)., Disp: , Rfl:    cephALEXin (KEFLEX) 500 MG capsule, Take 2 grams (4 tablets) by mouth 1 hour prior to dental cleaning/procedure., Disp: 4 capsule, Rfl: 2   clonazePAM (KLONOPIN) 0.5 MG tablet, Take 1 tablet by mouth twice daily as needed for anxiety, Disp: 30 tablet, Rfl: 3   denosumab (PROLIA) 60 MG/ML SOSY injection, Inject 60 mg into the skin every 6 (six) months., Disp: , Rfl:    furosemide (LASIX) 40 MG tablet, Take 1 tablet (40 MG) by mouth on Monday, Wednesday, Friday and Sunday., Disp: 90 tablet, Rfl: 3   furosemide (LASIX) 40 MG tablet, Take 1.5 tablets (60 mg total) by mouth as directed. Take 1.5 tablet by mouth on Tuesday, Thursday, and Saturday., Disp: 90 tablet, Rfl: 3   levothyroxine (SYNTHROID) 75 MCG tablet, Take 1 tablet by mouth once daily, Disp: 90 tablet, Rfl: 0   meclizine (ANTIVERT) 25 MG tablet, TAKE 1 TABLET BY MOUTH THREE TIMES DAILY AS NEEDED FOR DIZZINESS, Disp: 45 tablet, Rfl: 1   metoprolol tartrate (LOPRESSOR) 25 MG tablet, Take 1 tablet (25 mg total)  by mouth 2 (two) times daily., Disp: 180 tablet,  Rfl: 3   mupirocin ointment (BACTROBAN) 2 %, Apply 1 Application topically daily., Disp: , Rfl:    Nutritional Supplements (,FEEDING SUPPLEMENT, PROSOURCE PLUS) liquid, Take 30 mLs by mouth 2 (two) times daily between meals., Disp: 887 mL, Rfl: 0   nystatin (MYCOSTATIN/NYSTOP) powder, Apply topically 2 (two) times daily as needed (skin irritation (under breasts))., Disp: 15 g, Rfl: 0   ondansetron (ZOFRAN) 4 MG tablet, Take 1 tablet (4 mg total) by mouth every 8 (eight) hours as needed., Disp: 45 tablet, Rfl: 1   oxyCODONE (ROXICODONE) 5 MG/5ML solution, Take 4 mLs (4 mg total) by mouth every 4 (four) hours as needed for moderate pain., Disp: , Rfl: 0   pantoprazole (PROTONIX) 40 MG tablet, Take 1 tablet (40 mg total) by mouth daily., Disp: 30 tablet, Rfl: 5   polyethylene glycol (MIRALAX / GLYCOLAX) packet, Take 17 g by mouth daily as needed for mild constipation., Disp: , Rfl:    potassium chloride SA (KLOR-CON M) 20 MEQ tablet, Take 1 tablet (20 mEq total) by mouth daily., Disp: 90 tablet, Rfl: 3   revefenacin (YUPELRI) 175 MCG/3ML nebulizer solution, Take 3 mLs (175 mcg total) by nebulization daily., Disp: 90 mL, Rfl: 11   sodium chloride (OCEAN) 0.65 % SOLN nasal spray, Place 1 spray into both nostrils as needed for congestion (nose bleeds)., Disp: , Rfl:    valACYclovir (VALTREX) 500 MG tablet, Take 500 mg by mouth daily as needed (breakouts). , Disp: , Rfl:    White Petrolatum-Mineral Oil (ARTIFICIAL TEARS) ointment, Place into both eyes as needed., Disp: , Rfl:   -

## 2023-02-17 NOTE — Patient Instructions (Addendum)
Continue yupelri nebulizer treatments daily  You have moderate obstructive sleep apnea, we will hold off on CPAP titration study due to concern of being able to tolerate CPAP therapy  Follow up in 1 year, call sooner if needed

## 2023-02-19 ENCOUNTER — Telehealth: Payer: Self-pay | Admitting: Cardiology

## 2023-02-19 ENCOUNTER — Encounter: Payer: Self-pay | Admitting: Podiatry

## 2023-02-19 ENCOUNTER — Ambulatory Visit (INDEPENDENT_AMBULATORY_CARE_PROVIDER_SITE_OTHER): Payer: Medicare Other | Admitting: Podiatry

## 2023-02-19 DIAGNOSIS — Z7901 Long term (current) use of anticoagulants: Secondary | ICD-10-CM | POA: Diagnosis not present

## 2023-02-19 DIAGNOSIS — L84 Corns and callosities: Secondary | ICD-10-CM | POA: Diagnosis not present

## 2023-02-19 DIAGNOSIS — M216X2 Other acquired deformities of left foot: Secondary | ICD-10-CM

## 2023-02-19 DIAGNOSIS — M5416 Radiculopathy, lumbar region: Secondary | ICD-10-CM

## 2023-02-19 NOTE — Telephone Encounter (Signed)
Patient states "this has been going on a long time".  She states called many times.  She states PCP sent her to the podiatrist. She states her feet turn in and she has been to the doctors for this.  Do remember this from previous calls.  She is advised to call her PCP regarding the "turning in of her feet"/  Regarding edema, she states not worse than her last visit.  Advised to continue Lasix as prescribed, elevate legs as much as possible, and avoid excess sodium.  She discussed her appointment for callus later today.  Also discussed upcoming appt with Dr Jens Som. She wants to discuss her pulmonary visits with him when she comes in.  No further questions or concerns at this time

## 2023-02-19 NOTE — Progress Notes (Signed)
  Subjective:  Patient ID: [name], female    DOB: [birth year],   MRN: [id number]  No chief complaint on file.   77 y.o. female presents for new concern of bilateral foot calluses. Relates she has had them removed int he past and pain has improved. Relates she is still dealing with pain and numbness in her left and right legs. She is on a blood thinner and at risk for foot care. History of arthritis in her back since 2019.  [name] Denies any other pedal complaints. Denies n/v/f/c.   Past Medical History:  Diagnosis Date   Allergies    Anxiety    Arthritis    "maybe in my back" (03/31/2018)   Benign paroxysmal positional vertigo 06/08/2013   Complication of anesthesia    Fracture of multiple ribs 2015   "don't know from what; dx'd when I in hospital for 1st back OR" (03/31/2018)   GERD (gastroesophageal reflux disease)    Hair loss 04/12/2012   Herpes    History of blood transfusion    "twice; related to back OR" (03/31/2018)   History of kidney stones    Interstitial cystitis 11/06/2011   Melanoma of ankle (HCC) ~ 2003   "right"   Mitral regurgitation    Osteopenia 02/18/2012   Osteoporosis    PAF (paroxysmal atrial fibrillation) (HCC) 2012   Peripheral neuropathy 11/06/2011   PMDD (premenstrual dysphoric disorder)    PONV (postoperative nausea and vomiting)    nausea, vomiting, hives and dizziness    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/2020   Complete bilateral atrial lesion set using cryothermy and bipolar radiofrequency ablation with clipping of LA appendage via right mini-thoracotomy approach   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 CFT <3 seconds. Normal hair growth on digits. Edema and varicosities  noted bilateral feet covered with makeup.   Skin. No lacerations or abrasions bilateral feet. Hyperkeratotic lesions noted sub fifth metatarsal bilateral and sub first metatarsal on the right.  Musculoskeletal: MMT 5/5 bilateral lower extremities in DF, PF, Inversion and Eversion. Deceased ROM in DF of ankle joint. Mild pes cavus noted. No pain to palpation of bilateral feet today. Upon standing do notice hire arch and mild varus of calcaneus.  Neurological: Sensation intact to light touch.   Assessment:   1. Callus of foot   2. Chronic anticoagulation   3. Lumbar radiculopathy   4. Acquired bilateral pes cavus       Plan:  Patient was evaluated and treated and all questions answered. -Xrays reviewed. Bilateral moderate bunion deformities noted and mild pes cavus bilateral.  -Discussed treatement options; discussed pes cavus deformity;conservative and  surgical  -Orthotics not covered.  -Discussed corns and calluses with patient and treatment options.  -Hyperkeratotic tissue was debrided with chisel without incident.  -Encouraged daily moisturizing -Discussed use of pumice stone -Advised good supportive shoes and inserts -Patient to return to office as needed or sooner if condition worsens.     Louann Sjogren, DPM

## 2023-02-19 NOTE — Telephone Encounter (Signed)
Pt c/o swelling: STAT is pt has developed SOB within 24 hours  How much weight have you gained and in what time span? none  If swelling, where is the swelling located? Swelling in ankles   Are you currently taking a fluid pill? yes  Are you currently SOB? Yes, but always have had it  Do you have a log of your daily weights (if so, list)? no  Have you gained 3 pounds in a day or 5 pounds in a week? no  Have you traveled recently? no

## 2023-02-23 DIAGNOSIS — I499 Cardiac arrhythmia, unspecified: Secondary | ICD-10-CM | POA: Diagnosis not present

## 2023-02-23 DIAGNOSIS — I10 Essential (primary) hypertension: Secondary | ICD-10-CM | POA: Diagnosis not present

## 2023-02-25 ENCOUNTER — Encounter: Payer: Self-pay | Admitting: Pulmonary Disease

## 2023-02-25 ENCOUNTER — Ambulatory Visit: Payer: Medicare Other | Admitting: Family Medicine

## 2023-02-26 ENCOUNTER — Encounter: Payer: Self-pay | Admitting: Family Medicine

## 2023-02-26 ENCOUNTER — Ambulatory Visit: Payer: Medicare Other | Admitting: Family Medicine

## 2023-02-26 VITALS — BP 114/68 | HR 58 | Temp 97.9°F | Resp 17 | Ht 66.0 in | Wt 108.5 lb

## 2023-02-26 DIAGNOSIS — M25375 Other instability, left foot: Secondary | ICD-10-CM | POA: Diagnosis not present

## 2023-02-26 DIAGNOSIS — R110 Nausea: Secondary | ICD-10-CM

## 2023-02-26 DIAGNOSIS — M542 Cervicalgia: Secondary | ICD-10-CM

## 2023-02-26 DIAGNOSIS — M549 Dorsalgia, unspecified: Secondary | ICD-10-CM

## 2023-02-26 DIAGNOSIS — G8929 Other chronic pain: Secondary | ICD-10-CM

## 2023-02-26 NOTE — Patient Instructions (Signed)
Follow up as needed or as scheduled The nausea is due to the oxycodone use It's ok to take the Ondansetron daily Continue to eat regularly and add Boost or Ensure when possible Make sure you are wearing shoes w/ good arch supports to prevent the foot from rolling in We'll call you with your referral to Neurosurgery Call with any questions or concerns Hang in there!!!

## 2023-02-26 NOTE — Progress Notes (Signed)
   Subjective:    Patient ID: [name], female    DOB: May 16, 1946, 77 y.o.   MRN: [id number]  HPI Nausea- 'off and on for a long time'.  Pt reports she is having sxs nearly daily.  Pt reports some improvement w/ Zofran daily.  Was afraid to get dependent on it.  Is on oxycodone 'round the clock'.    Foot turning in- L foot.  Reports that foot will roll inward.  Has seen podiatry and was told to get orthotics but she doesn't understand the cause.  Chronic neck pain- pt is seeing pain management.  Was told by office that did her surgery that she doesn't need to come back unless a rod breaks.  Would like a 2nd opinion   Review of Systems For ROS see HPI     Objective:   Physical Exam Vitals reviewed.  Constitutional:      General: She is not in acute distress.    Comments: Thin, frail  HENT:     Head: Normocephalic and atraumatic.  Neck:     Comments: Visible hardware Abdominal:     General: There is no distension.     Palpations: Abdomen is soft.     Tenderness: There is no abdominal tenderness. There is no guarding or rebound.  Musculoskeletal:     Comments: High, rigid arches of feet bilaterally  Neurological:     Mental Status: She is alert. Mental status is at baseline.  Psychiatric:     Comments: anxious           Assessment & Plan:   Nausea- deteriorated.  Pt reports she is now having nausea regularly.  Discussed that chronic use of pain medication causes nausea.  Reassured her that it's ok to take Zofran daily as needed to improve sxs.  Pt expressed understanding and is in agreement w/ plan.   Chronic neck pain- pt has hx of multiple surgeries w/ rods and screws in her neck and spine.  She was told she didn't need to return to her previous surgeon's office unless 'a rod broke'.  She would like a 2nd opinion given her chronic pain and excessive hardware.  Referral placed.  L foot instability- new.  Pt wears sandals or flats.  Discussed that w/ her high, rigid  arch she needs much more arch support to prevent her ankle/foot from rolling.  Encouraged her to proceed w/ orthotics.  Will follow.

## 2023-02-27 ENCOUNTER — Ambulatory Visit: Payer: Medicare Other | Admitting: Family Medicine

## 2023-03-02 NOTE — Progress Notes (Signed)
HPI: Follow-up chronic dyspnea, previous mitral valve repair, paroxysmal atrial fibrillation.  Patient underwent minimally invasive mitral valve repair, complete maze procedure and sternotomy with repair of left ventricular free wall laceration July 2021.  Note preoperative catheterization May 2021 showed 20% right coronary artery and 20% distal LAD.  She has had difficulties with continuing dyspnea and chest pain based on reviewing records. Chest CT August 2023 showed right middle lobe and left upper lobe nodularity suggestive of atypical infection, nodule in the left major fissure.  ABIs May 2024 normal.  Echocardiogram July 2024 showed normal LV function, moderate RV dysfunction, moderate right ventricular enlargement, moderate pulm hypertension, moderate right atrial enlargement, status post mitral valve repair with mean gradient 2 mmHg and trace mitral regurgitation, moderate tricuspid regurgitation, mild aortic insufficiency and moderate pulm monic insufficiency.  She has also been diagnosed with obstructive sleep apnea and is now on CPAP.  Since last seen she has multiple complaints today.  She continues to have dyspnea.  She has occasional chest pain which is chronic.  She complains of neck and back pain.  She is concerned about her previous neurosurgery for her back and feels as though it is contributing to her shortness of breath.  Current Outpatient Medications  Medication Sig Dispense Refill   acetaminophen (TYLENOL) 500 MG tablet Take 500 mg by mouth every 6 (six) hours as needed for moderate pain.      albuterol (VENTOLIN HFA) 108 (90 Base) MCG/ACT inhaler Inhale 1-2 puffs into the lungs every 6 (six) hours as needed. 8 g 2   apixaban (ELIQUIS) 5 MG TABS tablet Take 1 tablet (5 mg total) by mouth 2 (two) times daily. 60 tablet 5   b complex vitamins tablet Take 1 tablet by mouth daily.     Calcium Carbonate Antacid (TUMS CHEWY BITES PO) Take 1 tablet by mouth daily as needed (reflux).       Calcium Citrate-Vitamin D (CALCIUM + D PO) Take 1 tablet by mouth daily.     Carboxymethylcellul-Glycerin (LUBRICATING EYE DROPS OP) Place 1 drop into both eyes daily as needed (dry eyes).     cephALEXin (KEFLEX) 500 MG capsule Take 2 grams (4 tablets) by mouth 1 hour prior to dental cleaning/procedure. 4 capsule 2   clonazePAM (KLONOPIN) 0.5 MG tablet Take 1 tablet by mouth twice daily as needed for anxiety 30 tablet 3   denosumab (PROLIA) 60 MG/ML SOSY injection Inject 60 mg into the skin every 6 (six) months.     furosemide (LASIX) 40 MG tablet Take 1 tablet (40 MG) by mouth on Monday, Wednesday, Friday and Sunday. 90 tablet 3   furosemide (LASIX) 40 MG tablet Take 1.5 tablets (60 mg total) by mouth as directed. Take 1.5 tablet by mouth on Tuesday, Thursday, and Saturday. 90 tablet 3   levothyroxine (SYNTHROID) 75 MCG tablet Take 1 tablet by mouth once daily 90 tablet 0   meclizine (ANTIVERT) 25 MG tablet TAKE 1 TABLET BY MOUTH THREE TIMES DAILY AS NEEDED FOR DIZZINESS 45 tablet 1   metoprolol tartrate (LOPRESSOR) 25 MG tablet Take 1 tablet (25 mg total) by mouth 2 (two) times daily. 180 tablet 3   mupirocin ointment (BACTROBAN) 2 % Apply 1 Application topically daily.     Nutritional Supplements (,FEEDING SUPPLEMENT, PROSOURCE PLUS) liquid Take 30 mLs by mouth 2 (two) times daily between meals. 887 mL 0   nystatin (MYCOSTATIN/NYSTOP) powder Apply topically 2 (two) times daily as needed (skin irritation (under breasts)). 15  g 0   ondansetron (ZOFRAN) 4 MG tablet Take 1 tablet (4 mg total) by mouth every 8 (eight) hours as needed. 45 tablet 1   oxyCODONE (ROXICODONE) 5 MG/5ML solution Take 4 mLs (4 mg total) by mouth every 4 (four) hours as needed for moderate pain.  0   pantoprazole (PROTONIX) 40 MG tablet Take 1 tablet (40 mg total) by mouth daily. 30 tablet 5   polyethylene glycol (MIRALAX / GLYCOLAX) packet Take 17 g by mouth daily as needed for mild constipation.     potassium chloride SA  (KLOR-CON M) 20 MEQ tablet Take 1 tablet (20 mEq total) by mouth daily. 90 tablet 3   revefenacin (YUPELRI) 175 MCG/3ML nebulizer solution Take 3 mLs (175 mcg total) by nebulization daily. 90 mL 11   valACYclovir (VALTREX) 500 MG tablet Take 500 mg by mouth daily as needed (breakouts).      White Petrolatum-Mineral Oil (ARTIFICIAL TEARS) ointment Place into both eyes as needed.     sodium chloride (OCEAN) 0.65 % SOLN nasal spray Place 1 spray into both nostrils as needed for congestion (nose bleeds). (Patient not taking: Reported on 02/26/2023)     No current facility-administered medications for this visit.     Past Medical History:  Diagnosis Date   Allergies    Anxiety    Arthritis    "maybe in my back" (03/31/2018)   Benign paroxysmal positional vertigo 06/08/2013   Complication of anesthesia    Fracture of multiple ribs 2015   "don't know from what; dx'd when I in hospital for 1st back OR" (03/31/2018)   GERD (gastroesophageal reflux disease)    Hair loss 04/12/2012   Herpes    History of blood transfusion    "twice; related to back OR" (03/31/2018)   History of kidney stones    Interstitial cystitis 11/06/2011   Melanoma of ankle (HCC) ~ 2003   "right"   Mitral regurgitation    Osteopenia 02/18/2012   Osteoporosis    PAF (paroxysmal atrial fibrillation) (HCC) 2012   Peripheral neuropathy 11/06/2011   PMDD (premenstrual dysphoric disorder)    PONV (postoperative nausea and vomiting)    nausea, vomiting, hives and dizziness    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/2020   Complete bilateral atrial lesion set using cryothermy and bipolar radiofrequency ablation with clipping of LA appendage via right mini-thoracotomy approach   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   Complex valvuloplasty including artificial Gore-tex neochord placement x12 with 32mm Sorin Memo 4D ring annuloplasty   Seasonal allergies    Vaginal delivery    ONE NSVD   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     Past Surgical History:  Procedure Laterality Date   ANTERIOR CERVICAL DECOMP/DISCECTOMY FUSION  ~ 2003   BACK SURGERY     BREAST SURGERY     BREAST BIOPSY--RIGHT BENIGN   BUNIONECTOMY Bilateral    CARDIOVERSION N/A 01/26/2020   Procedure: CARDIOVERSION;  Surgeon: Sande Rives, MD;  Location: [facility] ENDOSCOPY;  Service: Cardiovascular;  Laterality: N/A;   COSMETIC SURGERY  2016   "back of my neck; related to earlier fusion"   CYSTOSCOPY W/ STONE MANIPULATION  "several times"   DILATION AND CURETTAGE OF UTERUS     FOREHEAD RECONSTRUCTION Right    "removed bone protruding out of my forehead"   HARDWARE REMOVAL  2016   "related to neck OR"   INCONTINENCE SURGERY     IR THORACENTESIS ASP PLEURAL SPACE W/IMG GUIDE  02/16/2020   MINIMALLY INVASIVE MAZE  PROCEDURE N/A 01/17/2020   Procedure: MINIMALLY INVASIVE MAZE PROCEDURE;  Surgeon: Purcell Nails, MD;  Location: [facility] OR;  Service: Open Heart Surgery;  Laterality: N/A;   MITRAL VALVE REPAIR Right 01/17/2020   Procedure: MINIMALLY INVASIVE MITRAL VALVE REPAIR (MVR) USING MEMO 4D ;  Surgeon: Purcell Nails, MD;  Location: [facility] OR;  Service: Open Heart Surgery;  Laterality: Right;   POSTERIOR CERVICAL FUSION/FORAMINOTOMY  ~ 2008; 2015   RIGHT/LEFT HEART CATH AND CORONARY ANGIOGRAPHY N/A 12/02/2019   Procedure: RIGHT/LEFT HEART CATH AND CORONARY ANGIOGRAPHY;  Surgeon: Kathleene Hazel, MD;  Location: [facility] INVASIVE CV LAB;  Service: Cardiovascular;  Laterality: N/A;   SHOULDER ARTHROSCOPY W/ ROTATOR CUFF REPAIR Right 2012   SPINAL FUSION  06/2014 - 2018 X ?7   "scoliosis; my entire back"   TEE WITHOUT CARDIOVERSION N/A 11/21/2019   Procedure: TRANSESOPHAGEAL ECHOCARDIOGRAM (TEE);  Surgeon: Wendall Stade, MD;  Location: [facility] ENDOSCOPY;  Service: Cardiovascular;  Laterality: N/A;   TEE WITHOUT CARDIOVERSION N/A 01/17/2020   Procedure: TRANSESOPHAGEAL ECHOCARDIOGRAM (TEE);  Surgeon: Purcell Nails, MD;  Location: [facility] OR;  Service:  Open Heart Surgery;  Laterality: N/A;   TEE WITHOUT CARDIOVERSION N/A 01/26/2020   Procedure: TRANSESOPHAGEAL ECHOCARDIOGRAM (TEE);  Surgeon: Sande Rives, MD;  Location: [facility] ENDOSCOPY;  Service: Cardiovascular;  Laterality: N/A;   TUBAL LIGATION     VAGINAL HYSTERECTOMY     TVH    Social History   Socioeconomic History   Marital status: Divorced    Spouse name: none/divorced   Number of children: Not on file   Years of education: Not on file   Highest education level: 12th grade  Occupational History   Occupation: retired  Tobacco Use   Smoking status: Former    Current packs/day: 0.00    Average packs/day: 0.1 packs/day for 14.0 years (1.4 ttl pk-yrs)    Types: Cigarettes    Start date: 1966    Quit date: 1980    Years since quitting: 44.7   Smokeless tobacco: Never   Tobacco comments:    03/31/2018 "quit ~ 1980; someday smoker when I did smoke; never addicted"  Vaping Use   Vaping status: Never Used  Substance and Sexual Activity   Alcohol use: Never   Drug use: Yes    Types: Oxycodone, Benzodiazepines    Comment: 03/31/2018 "for chronic neck and back pain", takes Klonopin at times.    Sexual activity: Not Currently  Other Topics Concern   Not on file  Social History Narrative   Lives by herself.    Social Determinants of Health   Financial Resource Strain: Low Risk  (10/23/2022)   Overall Financial Resource Strain (CARDIA)    Difficulty of Paying Living Expenses: Not hard at all  Food Insecurity: No Food Insecurity (10/23/2022)   Hunger Vital Sign    Worried About Running Out of Food in the Last Year: Never true    Ran Out of Food in the Last Year: Never true  Transportation Needs: No Transportation Needs (10/23/2022)   PRAPARE - Administrator, Civil Service (Medical): No    Lack of Transportation (Non-Medical): No  Physical Activity: Inactive (10/23/2022)   Exercise Vital Sign    Days of Exercise per Week: 0 days    Minutes of Exercise per  Session: 0 min  Stress: Stress Concern Present (10/23/2022)   [employer] of Occupational Health - Occupational Stress Questionnaire    Feeling of Stress : To some extent  Social Connections: Socially Isolated (10/23/2022)   Social Connection and Isolation Panel [NHANES]    Frequency of Communication with Friends and Family: More than three times a week    Frequency of Social Gatherings with Friends and Family: Once a week    Attends Religious Services: Never    Database administrator or Organizations: No    Attends Banker Meetings: Never    Marital Status: Divorced  Catering manager Violence: Not At Risk (01/10/2023)   Received from [employer], [facility]   HITS    Over the last 12 months how often did your partner physically hurt you?: 1    Over the last 12 months how often did your partner insult you or talk down to you?: 1    Over the last 12 months how often did your partner threaten you with physical harm?: 1    Over the last 12 months how often did your partner scream or curse at you?: 1    Family History  Problem Relation Age of Onset   Diabetes Father    Hyperlipidemia Sister    Heart disease Sister    Stroke Sister    Diabetes Brother    Hyperlipidemia Sister    Heart disease Sister    Arthritis Mother    Heart disease Mother    Uterine cancer Mother    Diabetes Brother    Heart Problems Brother     ROS: Neck and back pain but no fevers or chills, productive cough, hemoptysis, dysphasia, odynophagia, melena, hematochezia, dysuria, hematuria, rash, seizure activity, orthopnea, PND, pedal edema, claudication. Remaining systems are negative.  Physical Exam: Well-developed well-nourished in no acute distress.  Skin is warm and dry.  HEENT is normal.  Neck is supple.  Chest is clear to auscultation with normal expansion.  Cardiovascular exam is regular rate and rhythm.  Abdominal exam nontender or distended. No masses palpated. Extremities  show no edema. neuro grossly intact   A/P  1 status post mitral valve repair-most recent echocardiogram showed trace mitral regurgitation and no significant mitral stenosis.  Continue SBE prophylaxis.  2 paroxysmal atrial fibrillation-continue metoprolol and apixaban.  She remains in sinus rhythm on examination.  3 hypertension-patient's blood pressure is controlled.  Continue present medications.  4 chronic dyspnea-she is not volume overloaded on examination.  Will continue diuretic at present dose.  5 nonobstructive coronary disease-she previously declined statins.  6 CP-symptoms are chronic and unchanged.  7 obstructive sleep apnea-Managed by pulmonary.  8 severe anxiety-follow-up primary care.  Olga Millers, MD

## 2023-03-05 ENCOUNTER — Encounter: Payer: Self-pay | Admitting: Family Medicine

## 2023-03-07 DIAGNOSIS — I10 Essential (primary) hypertension: Secondary | ICD-10-CM | POA: Diagnosis not present

## 2023-03-07 DIAGNOSIS — I499 Cardiac arrhythmia, unspecified: Secondary | ICD-10-CM | POA: Diagnosis not present

## 2023-03-10 ENCOUNTER — Encounter: Payer: Self-pay | Admitting: Family Medicine

## 2023-03-11 ENCOUNTER — Encounter: Payer: Self-pay | Admitting: Cardiology

## 2023-03-11 ENCOUNTER — Ambulatory Visit: Payer: Medicare Other | Admitting: Cardiology

## 2023-03-11 VITALS — BP 116/57 | HR 67 | Ht 66.0 in | Wt 110.8 lb

## 2023-03-11 DIAGNOSIS — I10 Essential (primary) hypertension: Secondary | ICD-10-CM | POA: Diagnosis not present

## 2023-03-11 DIAGNOSIS — I5032 Chronic diastolic (congestive) heart failure: Secondary | ICD-10-CM | POA: Diagnosis not present

## 2023-03-11 DIAGNOSIS — Z9889 Other specified postprocedural states: Secondary | ICD-10-CM | POA: Diagnosis not present

## 2023-03-11 DIAGNOSIS — R0789 Other chest pain: Secondary | ICD-10-CM | POA: Diagnosis not present

## 2023-03-11 DIAGNOSIS — R0602 Shortness of breath: Secondary | ICD-10-CM | POA: Diagnosis not present

## 2023-03-11 DIAGNOSIS — G4733 Obstructive sleep apnea (adult) (pediatric): Secondary | ICD-10-CM | POA: Diagnosis not present

## 2023-03-11 NOTE — Patient Instructions (Signed)
Medication Instructions:   Your physician recommends that you continue on your current medications as directed. Please refer to the Current Medication list given to you today.   *If you need a refill on your cardiac medications before your next appointment, please call your pharmacy*   Lab Work:  None ordered.  If you have labs (blood work) drawn today and your tests are completely normal, you will receive your results only by: MyChart Message (if you have MyChart) OR A paper copy in the mail If you have any lab test that is abnormal or we need to change your treatment, we will call you to review the results.   Testing/Procedures:  None ordered.   Follow-Up: At [facility], you and your health needs are our priority.  As part of our continuing mission to provide you with exceptional heart care, we have created designated Provider Care Teams.  These Care Teams include your primary Cardiologist (physician) and Advanced Practice Providers (APPs -  Physician Assistants and Nurse Practitioners) who all work together to provide you with the care you need, when you need it.  We recommend signing up for the patient portal called "MyChart".  Sign up information is provided on this After Visit Summary.  MyChart is used to connect with patients for Virtual Visits (Telemedicine).  Patients are able to view lab/test results, encounter notes, upcoming appointments, etc.  Non-urgent messages can be sent to your provider as well.   To learn more about what you can do with MyChart, go to [email or web address].    Your next appointment:   6 month(s)  Provider:   Olga Millers, MD

## 2023-03-12 ENCOUNTER — Encounter: Payer: Self-pay | Admitting: Family Medicine

## 2023-03-12 NOTE — Telephone Encounter (Signed)
Please see last mychart.

## 2023-03-16 DIAGNOSIS — G894 Chronic pain syndrome: Secondary | ICD-10-CM | POA: Diagnosis not present

## 2023-03-16 DIAGNOSIS — M5412 Radiculopathy, cervical region: Secondary | ICD-10-CM | POA: Diagnosis not present

## 2023-03-16 DIAGNOSIS — Z79891 Long term (current) use of opiate analgesic: Secondary | ICD-10-CM | POA: Diagnosis not present

## 2023-03-16 DIAGNOSIS — M4693 Unspecified inflammatory spondylopathy, cervicothoracic region: Secondary | ICD-10-CM | POA: Diagnosis not present

## 2023-03-16 DIAGNOSIS — M961 Postlaminectomy syndrome, not elsewhere classified: Secondary | ICD-10-CM | POA: Diagnosis not present

## 2023-03-17 ENCOUNTER — Telehealth: Payer: Self-pay | Admitting: Family Medicine

## 2023-03-17 ENCOUNTER — Telehealth: Payer: Self-pay | Admitting: Cardiology

## 2023-03-17 NOTE — Telephone Encounter (Signed)
I have called the pt and explained to her that we need her to come down and sign a release for the medical records for DR Diamantina Providence and [facility] . She is suppose to come sign once she does and we get these records we can fax them to [place] at [facility]

## 2023-03-17 NOTE — Telephone Encounter (Signed)
Pt c/o medication issue:  1. Name of Medication: It is an injection(did not know the spelling of the injection)  2. How are you currently taking this medication (dosage and times per day)?   3. Are you having a reaction (difficulty breathing--STAT)?   4. What is your medication issue? She wants to talk to the nurse about taking this injection and wonder if it will bother her heart

## 2023-03-17 NOTE — Telephone Encounter (Signed)
I have left Rene Kocher a message stating Erica's message and explanting I do not have any records from DR Powers that the pt may need to sign a releasee

## 2023-03-17 NOTE — Telephone Encounter (Signed)
Spoke with pt, Aware of dr crenshaw's recommendations.  °

## 2023-03-17 NOTE — Telephone Encounter (Signed)
Patient states Dr Jens Som discussed an injection from neurologist she gets for shoulder.  She states kenalog.  She is asking is it OK to continue these injections. She wants to know if this will affect her heart or cause issues with AfiB

## 2023-03-17 NOTE — Telephone Encounter (Signed)
Rene Kocher from [facility] has a referral for pt. She is trying to track down records from Dr. Lorenso Courier, the neurosurgeon that performed her surgery. She stated they also need records from Dr. Oneal Grout who is her pain management provider. She stated they need these records before they will be able to assist her. Rene Kocher mentioned that she spoke with Alcario Drought on Thursday 03/12/23 and she was following up on whether we have those records or not. Because Dr Lorenso Courier works for [employer], would we have those records already?   Rene Kocher can be reached at [phone] ext [phone]. If you can't get ahold of her press 0 and have her paged.   Once we received the records can they please be faxed at attention to REGINA/ DR.POOL at [phone]

## 2023-03-24 DIAGNOSIS — Z9889 Other specified postprocedural states: Secondary | ICD-10-CM | POA: Diagnosis not present

## 2023-03-24 DIAGNOSIS — Z981 Arthrodesis status: Secondary | ICD-10-CM | POA: Diagnosis not present

## 2023-03-25 DIAGNOSIS — I10 Essential (primary) hypertension: Secondary | ICD-10-CM | POA: Diagnosis not present

## 2023-03-25 DIAGNOSIS — I499 Cardiac arrhythmia, unspecified: Secondary | ICD-10-CM | POA: Diagnosis not present

## 2023-03-26 DIAGNOSIS — F411 Generalized anxiety disorder: Secondary | ICD-10-CM | POA: Diagnosis not present

## 2023-03-30 ENCOUNTER — Telehealth: Payer: Self-pay | Admitting: Cardiology

## 2023-03-30 NOTE — Telephone Encounter (Signed)
Spoke with patient and she states her BP has been elevated and it has her concerned. She states it has been running in the SBP 160's . This morning her BP was 165/89 hr 68. She states she has been having some swelling in her feet and ankles. Denies chest pain, SOB, headache, nausea or vomiting.   States she is under lot of stress and is in constant pain and she is not getting any relief with pain medications.   Inform patient that anxiety, stress and pain contribute to elevated BP.  Did advise try elevating her feet when ever possible, compression stockings and staying hydrated. Will forward to MD.  She also wanted to know what you thought of her taking Doxepin 10 mg that was prescribed by her psychiatrist.  How you feel about her taking Klonopin for her anxiety as well with her heart concerns, an Yupelri prescribed by her pulmonologist.

## 2023-03-30 NOTE — Telephone Encounter (Signed)
Spoke with pt, aware okay to take the medications she listed.

## 2023-03-30 NOTE — Telephone Encounter (Signed)
Spoke with pt, aware her pain and anxiety will cause her blood pressure to rise.

## 2023-03-30 NOTE — Telephone Encounter (Signed)
Pt c/o BP issue: STAT if pt c/o blurred vision, one-sided weakness or slurred speech  1. What are your last 5 BP readings?  160 systolic this weekend 140's systolic today  2. Are you having any other symptoms (ex. Dizziness, headache, blurred vision, passed out)?  No   3. What is your BP issue?   Patient is following up (see previous encounter). She states she forgot to mention to [place], Charity fundraiser, that her BP has been fluctuating-high over the weekend. Patient states her systolic is normally lower <120.

## 2023-03-30 NOTE — Telephone Encounter (Signed)
Pt c/o BP issue: STAT if pt c/o blurred vision, one-sided weakness or slurred speech  1. What are your last 5 BP readings? 165/89 - This morning         2. Are you having any other symptoms (ex. Dizziness, headache, blurred vision, passed out)? Dizziness, nauseous   3. What is your BP issue? Pt states that BP has been elevated over the weekend that has her concerned

## 2023-04-01 ENCOUNTER — Encounter: Payer: Self-pay | Admitting: Family Medicine

## 2023-04-01 DIAGNOSIS — M5031 Other cervical disc degeneration,  high cervical region: Secondary | ICD-10-CM | POA: Diagnosis not present

## 2023-04-01 DIAGNOSIS — M542 Cervicalgia: Secondary | ICD-10-CM | POA: Diagnosis not present

## 2023-04-01 DIAGNOSIS — M50321 Other cervical disc degeneration at C4-C5 level: Secondary | ICD-10-CM | POA: Diagnosis not present

## 2023-04-01 DIAGNOSIS — M4802 Spinal stenosis, cervical region: Secondary | ICD-10-CM | POA: Diagnosis not present

## 2023-04-01 DIAGNOSIS — M5412 Radiculopathy, cervical region: Secondary | ICD-10-CM | POA: Diagnosis not present

## 2023-04-01 DIAGNOSIS — Z981 Arthrodesis status: Secondary | ICD-10-CM | POA: Diagnosis not present

## 2023-04-02 ENCOUNTER — Telehealth: Payer: Self-pay | Admitting: Internal Medicine

## 2023-04-02 NOTE — Telephone Encounter (Signed)
Patient contacted the after hours line to discuss that she thinks she missed her evening dose of apixaban.  She was unable to confirm if she took the medication and wanted to know what to do.  I informed her given that she may have possibly taken the medication, it's reasonable to just restart apixaban in the morning to decrease risk of too much apixaban.  Patient expressed understanding.

## 2023-04-06 DIAGNOSIS — I10 Essential (primary) hypertension: Secondary | ICD-10-CM | POA: Diagnosis not present

## 2023-04-06 DIAGNOSIS — I499 Cardiac arrhythmia, unspecified: Secondary | ICD-10-CM | POA: Diagnosis not present

## 2023-04-07 DIAGNOSIS — M542 Cervicalgia: Secondary | ICD-10-CM | POA: Diagnosis not present

## 2023-04-07 DIAGNOSIS — M546 Pain in thoracic spine: Secondary | ICD-10-CM | POA: Diagnosis not present

## 2023-04-09 DIAGNOSIS — L858 Other specified epidermal thickening: Secondary | ICD-10-CM | POA: Diagnosis not present

## 2023-04-09 DIAGNOSIS — L821 Other seborrheic keratosis: Secondary | ICD-10-CM | POA: Diagnosis not present

## 2023-04-09 DIAGNOSIS — Z85820 Personal history of malignant melanoma of skin: Secondary | ICD-10-CM | POA: Diagnosis not present

## 2023-04-16 DIAGNOSIS — E531 Pyridoxine deficiency: Secondary | ICD-10-CM | POA: Diagnosis not present

## 2023-04-16 DIAGNOSIS — R202 Paresthesia of skin: Secondary | ICD-10-CM | POA: Diagnosis not present

## 2023-04-16 DIAGNOSIS — G609 Hereditary and idiopathic neuropathy, unspecified: Secondary | ICD-10-CM | POA: Diagnosis not present

## 2023-04-16 DIAGNOSIS — R635 Abnormal weight gain: Secondary | ICD-10-CM | POA: Diagnosis not present

## 2023-04-16 DIAGNOSIS — M5417 Radiculopathy, lumbosacral region: Secondary | ICD-10-CM | POA: Diagnosis not present

## 2023-04-16 DIAGNOSIS — E538 Deficiency of other specified B group vitamins: Secondary | ICD-10-CM | POA: Diagnosis not present

## 2023-04-16 DIAGNOSIS — G603 Idiopathic progressive neuropathy: Secondary | ICD-10-CM | POA: Diagnosis not present

## 2023-04-16 DIAGNOSIS — M5412 Radiculopathy, cervical region: Secondary | ICD-10-CM | POA: Diagnosis not present

## 2023-04-18 DIAGNOSIS — Z23 Encounter for immunization: Secondary | ICD-10-CM | POA: Diagnosis not present

## 2023-04-24 DIAGNOSIS — I10 Essential (primary) hypertension: Secondary | ICD-10-CM | POA: Diagnosis not present

## 2023-04-24 DIAGNOSIS — I499 Cardiac arrhythmia, unspecified: Secondary | ICD-10-CM | POA: Diagnosis not present

## 2023-04-28 ENCOUNTER — Telehealth: Payer: Self-pay | Admitting: Pulmonary Disease

## 2023-04-28 NOTE — Telephone Encounter (Signed)
Patient called answering service. Has questions for nurse. Patient phone number is [phone].

## 2023-04-29 NOTE — Telephone Encounter (Signed)
Called and spoke with the pt  She was asking when to come in for planned f/u with Dr Francine Graven  Per LOV 02/17/23- return in a year, sooner if needed  She had multiple questions regarding her disease, and about what she can expect  She felt like a year was too far out to come back and asked for next opening with Dr Francine Graven  I have scheduled her for 06/18/23  Nothing further needed

## 2023-05-07 ENCOUNTER — Other Ambulatory Visit: Payer: Self-pay | Admitting: Family Medicine

## 2023-05-07 DIAGNOSIS — E785 Hyperlipidemia, unspecified: Secondary | ICD-10-CM

## 2023-05-07 DIAGNOSIS — I499 Cardiac arrhythmia, unspecified: Secondary | ICD-10-CM | POA: Diagnosis not present

## 2023-05-07 DIAGNOSIS — I10 Essential (primary) hypertension: Secondary | ICD-10-CM | POA: Diagnosis not present

## 2023-05-11 DIAGNOSIS — K4090 Unilateral inguinal hernia, without obstruction or gangrene, not specified as recurrent: Secondary | ICD-10-CM | POA: Diagnosis not present

## 2023-05-12 ENCOUNTER — Telehealth: Payer: Self-pay | Admitting: Cardiology

## 2023-05-12 ENCOUNTER — Other Ambulatory Visit: Payer: Self-pay

## 2023-05-12 MED ORDER — FUROSEMIDE 20 MG PO TABS: 20.0000 mg | ORAL_TABLET | ORAL | 3 refills | Status: DC

## 2023-05-12 MED ORDER — FUROSEMIDE 40 MG PO TABS: ORAL_TABLET | ORAL | 3 refills | Status: DC

## 2023-05-12 NOTE — Telephone Encounter (Signed)
Pt c/o medication issue:  1. Name of Medication:   furosemide (LASIX) 40 MG tablet    2. How are you currently taking this medication (dosage and times per day)?  Take 1 tablet (40 MG) by mouth on Monday, Wednesday, Friday and Sunday.       3. Are you having a reaction (difficulty breathing--STAT)? No  4. What is your medication issue? Pt is requesting a callback regarding the way she takes this medication. Please advise

## 2023-05-12 NOTE — Telephone Encounter (Signed)
Spoke with patient and she states its hard to cut her lasix in half and she does not think she is getting a full half dose.   She would like to know if you can send in a Rx for 20 mg as well to take with her 40 mg on Tuesday, Thursday, and Saturday.

## 2023-05-12 NOTE — Telephone Encounter (Signed)
Called patient to advise Prescription for Lasix 20 mg sent to Pharmacy.

## 2023-05-14 ENCOUNTER — Telehealth: Payer: Self-pay | Admitting: Cardiology

## 2023-05-14 DIAGNOSIS — F411 Generalized anxiety disorder: Secondary | ICD-10-CM | POA: Diagnosis not present

## 2023-05-14 NOTE — Telephone Encounter (Signed)
Patient is calling to get the process started for paperwork for [name] to get her medication. Please call back

## 2023-05-14 NOTE — Telephone Encounter (Signed)
Spoke with patient and she would like to know if we can print out patient assistance forms from [employer] for her Eliquis. She states she would like to start getting ready for next year. She knows how much she needs to spend so they can help with the cost of her Eliquis. She would like for provider to fill out his part and she can come pick up the application and fill out her portion and then send it  in after the 1st of January,

## 2023-05-15 NOTE — Telephone Encounter (Signed)
Left message for patient, application has been placed in the mail. Encouraged patient to return to me so I can fax to the company.

## 2023-05-18 ENCOUNTER — Telehealth: Payer: Self-pay | Admitting: Cardiology

## 2023-05-18 NOTE — Telephone Encounter (Signed)
Patient identification verified by 2 forms. Marilynn Rail, RN   Called and spoke to patient  Patient states:   -started checking BP yesterday   -has been feeling "Bad" for a few weeks   -not currently at home, getting something to eat   -she is not on any BP medicine, has history of low BP   -continues to take Metoprolol 25Mg  BID   -feels a little lightheaded/dizzy, not quiet like herself   -took meclizine for dizziness today   -does not know what it causing increased BP  Patient denies:   -Headaches   -Chest pain   -New/worsening SOB/difficulty breathing   -new numbness/tingling   -weakness  Pt scheduled for OV 11/13 at 2:15pm  Reviewed strict ED warning signs/precautions  Patient verbalized understanding, no questions or concerns at this time

## 2023-05-18 NOTE — Telephone Encounter (Signed)
Pt c/o BP issue: STAT if pt c/o blurred vision, one-sided weakness or slurred speech  1. What are your last 5 BP readings?   162/85  HR 81 132/78  HR 70 131/76  HR 70  2. Are you having any other symptoms (ex. Dizziness, headache, blurred vision, passed out)?  Slight dizziness, headaches off and on  3. What is your BP issue?   Patient stated her blood pressure has been reading high and wants to know next steps.

## 2023-05-19 DIAGNOSIS — G894 Chronic pain syndrome: Secondary | ICD-10-CM | POA: Diagnosis not present

## 2023-05-19 DIAGNOSIS — Z79891 Long term (current) use of opiate analgesic: Secondary | ICD-10-CM | POA: Diagnosis not present

## 2023-05-19 NOTE — Progress Notes (Unsigned)
Cardiology Office Note    Date:  05/20/2023  ID:  Levie, Swinehart [birth year], MRN [id number] PCP:  Sheliah Hatch, MD  Cardiologist:  Olga Millers, MD  Electrophysiologist:  Regan Lemming, MD   Chief Complaint: Hypertension   History of Present Illness: .    [name] is a 77 y.o. female with visit-pertinent history of chronic dyspnea, PAF, previous MV repair, coronary artery disease.  Patient had an echocardiogram in 07/2019 that showed EF 60 to 65%, no RWMA, normal RV systolic function, moderate mitral valve regurgitation with by leaflet prolapse.  She underwent TEE on 11/21/2019 that showed severe mitral valve regurgitation with a flail segment of the anterior leaflet.  Is recommended that she undergo mitral valve repair.  Prior to procedure patient had a right/left heart catheterization on 12/02/2019 that showed mild, nonobstructive CAD with 20% stenosis in the proximal RCA and distal LAD.  She was seen by Dr. Barry Dienes and underwent minimally invasive mitral valve repair, complete maze procedure and sternotomy with repair of left ventricular free wall laceration on 01/17/2020.  Postoperatively she had acute blood loss anemia requiring blood transfusions and A-fib with RVR for which she was placed on amiodarone and Lopressor and Tikosyn was discontinued.  Later amiodarone was discontinued and her A-fib has been managed with Eliquis and metoprolol.  Patient has noted history of chronic dyspnea, echocardiogram from 02/2022 showed EF 50 to 55%, moderately reduced RV systolic function.  There was interventricular septal flattening in systole consistent with RV pressure overload.  Findings were consistent with normal structure and function of mitral valve prosthesis.  Follow-up chest CT in 02/2022 revealed right middle lobe and left upper lobe nodularity suggestive of atypical infection, nodule in the left major fissure.  She is followed by Dr. Francine Graven with pulmonology.  When seen by  cardiology in 10/2022 patient reported frequent nosebleeds while on Eliquis.  Is recommended she consider water.  Seizure, she refused.  She also noted pain in her calves and ankles, underwent ABIs in 11/2022 that were normal.  Echocardiogram on 01/27/2023 indicated LVEF of 55 to 60%, LV with normal function, mild LVH, into her ventricular septum is flattened in systole, consistent with right ventricular pressure overload.  RV systolic function moderately reduced, RV size moderately enlarged, moderately elevated pulmonary artery systolic pressure.  Mitral valve repair/replaced, trivial mitral valve regurgitation, mean gradient 2.0 mmHg with an average heart rate of 60 bpm.  Today she presents with concerns for her blood pressure.  She reports that on Monday her systolic blood pressure was 165, then on recheck came down to 129/71.  She noted that she checked her blood pressure multiple times that day and had fluctuations with another reading of 145/76.  She reports that she has been very anxious and stressed lately regarding all of her medical conditions.  She wanted to ensure that her blood pressure was normal.  She notes that her other doctors visits lately her blood pressure has been normal. She notes one episodes of chest soreness that last all day earlier this week, not associated with exertion, denies any other episodes of chest pain. Notes the day before she had been lifting water out of her washing machine, pain felt to be musculoskeletal in nature.  She reports that she has been seeing a counselor to talk about her anxiety and has tried numerous medications which unfortunately have made her feel worse.  She notes that she remains unable to tolerate the idea of a CPAP  device, plans to follow-up further with her pulmonologist for possible other options.  Labwork independently reviewed: 01/22/2023: Sodium 145, potassium 4.9, creatinine 0.85 ROS: .   Today she denies chest pain, shortness of breath, lower  extremity edema, fatigue, palpitations, melena, hematuria, hemoptysis, diaphoresis, weakness, presyncope, syncope, orthopnea, and PND.  All other systems are reviewed and otherwise negative.  Studies Reviewed: [name]    EKG:  EKG is ordered today, personally reviewed, demonstrating  EKG Interpretation Date/Time:  Wednesday May 20 2023 14:33:20 EST Ventricular Rate:  69 PR Interval:    QRS Duration:  72 QT Interval:  392 QTC Calculation: 420 R Axis:   91  Text Interpretation: Sinus rhythm Low voltage QRS No significant change since last tracing Confirmed by Reather Littler [phone]) on 05/20/2023 3:38:55 PM   CV Studies:  Cardiac Studies & Procedures   CARDIAC CATHETERIZATION  CARDIAC CATHETERIZATION 12/02/2019  Narrative  Prox RCA lesion is 20% stenosed.  Dist LAD lesion is 20% stenosed.  1. Mild non-obstructive CAD  Recommendation: Continue planning for mitral valve repair.  Findings Coronary Findings Diagnostic  Dominance: Right  Left Anterior Descending Vessel is large. Dist LAD lesion is 20% stenosed.  Left Circumflex Vessel is small.  Right Coronary Artery Vessel is large. Prox RCA lesion is 20% stenosed.  Intervention  No interventions have been documented.     ECHOCARDIOGRAM  ECHOCARDIOGRAM COMPLETE 01/27/2023  Narrative ECHOCARDIOGRAM REPORT    Patient Name:   [name] Date of Exam: 01/27/2023 Medical Rec #:  [id number]   Height:       66.0 in Accession #:    [id number]  Weight:       114.2 lb Date of Birth:  1945-10-12   BSA:          1.576 m Patient Age:    60 years    BP:           100/60 mmHg Patient Gender: F           HR:           65 bpm. Exam Location:  Outpatient  Procedure: 2D Echo, Cardiac Doppler and Color Doppler  Indications:    S/P Mitral Valve repair Z98.89  History:        Patient has prior history of Echocardiogram examinations, most recent 02/12/2022. Arrythmias:Atrial Fibrillation; Signs/Symptoms:Shortness of  Breath.  Mitral Valve: 32 mm Sorin Memo 4D prosthetic annuloplasty ring valve is present in the mitral position. Procedure Date: 01/17/20.  Sonographer:    Eulah Pont RDCS Referring Phys: [id number] Jonita Albee   Sonographer Comments: Image acquisition challenging due to patient body habitus. IMPRESSIONS   1. Left ventricular ejection fraction, by estimation, is 55 to 60%. The left ventricle has normal function. Left ventricular endocardial border not optimally defined to evaluate regional wall motion. There is mild left ventricular hypertrophy. Left ventricular diastolic function could not be evaluated. There is the interventricular septum is flattened in systole, consistent with right ventricular pressure overload. 2. Right ventricular systolic function is moderately reduced. The right ventricular size is moderately enlarged. There is moderately elevated pulmonary artery systolic pressure. The estimated right ventricular systolic pressure is 48.9 mmHg. 3. Right atrial size was moderately dilated. 4. The mitral valve has been repaired/replaced. Trivial mitral valve regurgitation. The mean mitral valve gradient is 2.0 mmHg with average heart rate of 63 bpm. There is a 32 mm Sorin Memo 4D prosthetic annuloplasty ring present in the mitral position. Procedure Date: 01/17/20. Echo findings are consistent  with normal structure and function of the mitral valve prosthesis. 5. Tricuspid valve regurgitation is moderate. 6. The aortic valve is tricuspid. There is mild calcification of the aortic valve. There is mild thickening of the aortic valve. Aortic valve regurgitation is mild. Aortic valve sclerosis is present, with no evidence of aortic valve stenosis. 7. Pulmonic valve regurgitation is moderate. 8. The inferior vena cava is dilated in size with <50% respiratory variability, suggesting right atrial pressure of 15 mmHg.  FINDINGS Left Ventricle: Left ventricular ejection fraction, by  estimation, is 55 to 60%. The left ventricle has normal function. Left ventricular endocardial border not optimally defined to evaluate regional wall motion. The left ventricular internal cavity size was normal in size. There is mild left ventricular hypertrophy. The interventricular septum is flattened in systole, consistent with right ventricular pressure overload. Left ventricular diastolic function could not be evaluated.  Right Ventricle: The right ventricular size is moderately enlarged. No increase in right ventricular wall thickness. Right ventricular systolic function is moderately reduced. There is moderately elevated pulmonary artery systolic pressure. The tricuspid regurgitant velocity is 2.91 m/s, and with an assumed right atrial pressure of 15 mmHg, the estimated right ventricular systolic pressure is 48.9 mmHg.  Left Atrium: Left atrial size was normal in size.  Right Atrium: Right atrial size was moderately dilated.  Pericardium: There is no evidence of pericardial effusion.  Mitral Valve: The mitral valve has been repaired/replaced. Trivial mitral valve regurgitation. There is a 32 mm Sorin Memo 4D prosthetic annuloplasty ring present in the mitral position. Procedure Date: 01/17/20. Echo findings are consistent with normal structure and function of the mitral valve prosthesis. MV peak gradient, 8.8 mmHg. The mean mitral valve gradient is 2.0 mmHg with average heart rate of 63 bpm.  Tricuspid Valve: The tricuspid valve is normal in structure. Tricuspid valve regurgitation is moderate.  Aortic Valve: The aortic valve is tricuspid. There is mild calcification of the aortic valve. There is mild thickening of the aortic valve. Aortic valve regurgitation is mild. Aortic valve sclerosis is present, with no evidence of aortic valve stenosis.  Pulmonic Valve: The pulmonic valve was normal in structure. Pulmonic valve regurgitation is moderate.  Aorta: The aortic root is normal in size  and structure.  Venous: The inferior vena cava is dilated in size with less than 50% respiratory variability, suggesting right atrial pressure of 15 mmHg.  IAS/Shunts: The atrial septum is grossly normal.   LEFT VENTRICLE PLAX 2D LVIDd:         2.90 cm LVIDs:         2.10 cm LV PW:         1.10 cm LV IVS:        1.00 cm LVOT diam:     1.90 cm LV SV:         42 LV SV Index:   27 LVOT Area:     2.84 cm  LV Volumes (MOD) LV vol d, MOD A2C: 59.7 ml LV vol d, MOD A4C: 44.9 ml LV vol s, MOD A2C: 23.1 ml LV vol s, MOD A4C: 14.9 ml LV SV MOD A2C:     36.6 ml LV SV MOD A4C:     44.9 ml LV SV MOD BP:      33.8 ml  RIGHT VENTRICLE RV S prime:     8.99 cm/s TAPSE (M-mode): 1.5 cm  LEFT ATRIUM             Index  RIGHT ATRIUM           Index LA diam:        3.10 cm 1.97 cm/m   RA Area:     20.30 cm LA Vol (A2C):   36.9 ml 23.41 ml/m  RA Volume:   68.10 ml  43.21 ml/m LA Vol (A4C):   40.9 ml 25.95 ml/m LA Biplane Vol: 40.0 ml 25.38 ml/m AORTIC VALVE                   PULMONIC VALVE LVOT Vmax:         70.90 cm/s  PR End Diast Vel: 6.15 msec LVOT Vmean:        43.400 cm/s LVOT VTI:          0.149 m AR Vena Contracta: 0.30 cm  AORTA Ao Root diam: 3.00 cm Ao Asc diam:  3.00 cm  MITRAL VALVE              TRICUSPID VALVE MV Area (PHT): 2.89 cm   TR Peak grad:   33.9 mmHg MV Area VTI:   1.19 cm   TR Vmax:        291.00 cm/s MV Peak grad:  8.8 mmHg MV Mean grad:  2.0 mmHg   SHUNTS MV Vmax:       1.48 m/s   Systemic VTI:  0.15 m MV Vmean:      62.3 cm/s  Systemic Diam: 1.90 cm  Weston Brass MD Electronically signed by Weston Brass MD Signature Date/Time: 01/28/2023/9:53:11 PM    Final   TEE  ECHO TEE 01/26/2020  Narrative TRANSESOPHOGEAL ECHO REPORT    Patient Name:   [name] Date of Exam: 01/26/2020 Medical Rec #:  [id number]   Height:       66.0 in Accession #:    [id number]  Weight:       136.7 lb Date of Birth:  02/02/1946   BSA:           1.701 m Patient Age:    74 years    BP:           118/66 mmHg Patient Gender: F           HR:           123 bpm. Exam Location:  Inpatient  Procedure: TEE-Intraopertive and 3D Echo  Indications:     atrial fibrillation  History:         Patient has prior history of Echocardiogram examinations, most recent 11/21/2019. Arrythmias:Atrial Fibrillation.  Mitral Valve: 32 mm prosthetic annuloplasty ring valve is present in the mitral position. Procedure Date: 01/17/2020.  Sonographer:     Delcie Roch Referring Phys:  [id number] Graciella Freer Diagnosing Phys: Lennie Odor MD  PROCEDURE: After discussion of the risks and benefits of a TEE, an informed consent was obtained from the patient. TEE procedure time was 20 minutes. The transesophogeal probe was passed without difficulty through the esophogus of the patient. Imaged were obtained with the patient in a left lateral decubitus position. Local oropharyngeal anesthetic was provided with Cetacaine. Sedation performed by different physician. The patient was monitored while under deep sedation. Anesthestetic sedation was provided intravenously by Anesthesiology: 270mg  of Propofol. Image quality was excellent. The patient's vital signs; including heart rate, blood pressure, and oxygen saturation; remained stable throughout the procedure. The patient developed no complications during the procedure. A successful direct current cardioversion was performed at 200 joules with 1 attempt.  IMPRESSIONS  1. Left ventricular ejection fraction, by estimation, is 55 to 60%. The left ventricle has normal function. The left ventricle has no regional wall motion abnormalities. 2. Right ventricular systolic function is normal. The right ventricular size is normal. 3. S/p surgical LAA clipping. No residual connection noted with the LA. No LA thrombus. No left atrial/left atrial appendage thrombus was detected. 4. S/p MV repair with 32 mm annuloplasty  ring. No residual MR. MVA by direct 3D MPR assessment 2.6 cm2. The mitral valve has been repaired/replaced. No evidence of mitral valve regurgitation. No evidence of mitral stenosis. The mean mitral valve gradient is 4.0 mmHg with average heart rate of 111 bpm. There is a 32 mm prosthetic annuloplasty ring present in the mitral position. Procedure Date: 01/17/2020. 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 6. The aortic valve is tricuspid. Aortic valve regurgitation is not visualized. No aortic stenosis is present.  Conclusion(s)/Recommendation(s): No LA/LAA thrombus identified. Successful cardioversion performed with restoration of normal sinus rhythm.  FINDINGS Left Ventricle: Left ventricular ejection fraction, by estimation, is 55 to 60%. The left ventricle has normal function. The left ventricle has no regional wall motion abnormalities. The left ventricular internal cavity size was normal in size. There is no left ventricular hypertrophy.  Right Ventricle: The right ventricular size is normal. No increase in right ventricular wall thickness. Right ventricular systolic function is normal.  Left Atrium: S/p surgical LAA clipping. No residual connection noted with the LA. No LA thrombus. Left atrial size was normal in size. No left atrial/left atrial appendage thrombus was detected.  Right Atrium: Right atrial size was normal in size.  Pericardium: Trivial pericardial effusion is present.  Mitral Valve: S/p MV repair with 32 mm annuloplasty ring. No residual MR. MVA by direct 3D MPR assessment 2.6 cm2. The mitral valve has been repaired/replaced. No evidence of mitral valve regurgitation. There is a 32 mm prosthetic annuloplasty ring present in the mitral position. Procedure Date: 01/17/2020. No evidence of mitral valve stenosis. The mean mitral valve gradient is 4.0 mmHg with average heart rate of 111 bpm.  Tricuspid Valve: The tricuspid valve is myxomatous. Tricuspid valve regurgitation is mild  . No evidence of tricuspid stenosis.  Aortic Valve: The aortic valve is tricuspid. Aortic valve regurgitation is not visualized. No aortic stenosis is present.  Pulmonic Valve: The pulmonic valve was grossly normal. Pulmonic valve regurgitation is mild. No evidence of pulmonic stenosis.  Aorta: The aortic root, ascending aorta, aortic arch and descending aorta are all structurally normal, with no evidence of dilitation or obstruction.  Venous: The left upper pulmonary vein, left lower pulmonary vein and right upper pulmonary vein are normal.  IAS/Shunts: There is redundancy of the interatrial septum. No atrial level shunt detected by color flow Doppler.  Additional Comments: There is a small pleural effusion in the left lateral region.    AORTA Ao Root diam: 3.20 cm Ao Asc diam:  2.80 cm  MITRAL VALVE           TRICUSPID VALVE MV Mean grad: 4.0 mmHg TR Peak grad:   25.0 mmHg TR Vmax:        250.00 cm/s  Lennie Odor MD Electronically signed by Lennie Odor MD Signature Date/Time: 01/26/2020/11:22:07 AM    Final   MONITORS  LONG TERM MONITOR (3-14 DAYS) 06/06/2019  Narrative Max 226 bpm 06:22pm, 11/15 Min 54 bpm 12:59am, 11/15 Avg 76 bpm Sinus rhythm with occasional runs of SVT, longest 15 seconds at 123 bmp Rare PVCs, 2.2% PACs  Zero atrial fibrillation noted Patient trigger associated with sinus rhythm, rate 87  Will Camnitz, MD            Current Reported Medications:.    Current Meds  Medication Sig   acetaminophen (TYLENOL) 500 MG tablet Take 500 mg by mouth every 6 (six) hours as needed for moderate pain.    albuterol (VENTOLIN HFA) 108 (90 Base) MCG/ACT inhaler Inhale 1-2 puffs into the lungs every 6 (six) hours as needed.   apixaban (ELIQUIS) 5 MG TABS tablet Take 1 tablet (5 mg total) by mouth 2 (two) times daily.   Ascorbic Acid (VITAMIN C PO) Take by mouth daily.   b complex vitamins tablet Take 1 tablet by mouth daily.   Calcium Carbonate Antacid  (TUMS CHEWY BITES PO) Take 1 tablet by mouth daily as needed (reflux).    Carboxymethylcellul-Glycerin (LUBRICATING EYE DROPS OP) Place 1 drop into both eyes daily as needed (dry eyes).   cephALEXin (KEFLEX) 500 MG capsule Take 2 grams (4 tablets) by mouth 1 hour prior to dental cleaning/procedure.   clonazePAM (KLONOPIN) 0.5 MG tablet Take 1 tablet by mouth twice daily as needed for anxiety   denosumab (PROLIA) 60 MG/ML SOSY injection Inject 60 mg into the skin every 6 (six) months.   furosemide (LASIX) 20 MG tablet Take 1 tablet (20 mg total) by mouth every other day.   furosemide (LASIX) 40 MG tablet Take 1.5 tablets (60 mg total) by mouth as directed. Take 1.5 tablet by mouth on Tuesday, Thursday, and Saturday.   levothyroxine (SYNTHROID) 75 MCG tablet Take 1 tablet by mouth once daily   meclizine (ANTIVERT) 25 MG tablet TAKE 1 TABLET BY MOUTH THREE TIMES DAILY AS NEEDED FOR DIZZINESS   metoprolol tartrate (LOPRESSOR) 25 MG tablet Take 1 tablet (25 mg total) by mouth 2 (two) times daily.   mupirocin ointment (BACTROBAN) 2 % Apply 1 Application topically daily.   Nutritional Supplements (,FEEDING SUPPLEMENT, PROSOURCE PLUS) liquid Take 30 mLs by mouth 2 (two) times daily between meals.   nystatin (MYCOSTATIN/NYSTOP) powder Apply topically 2 (two) times daily as needed (skin irritation (under breasts)).   ondansetron (ZOFRAN) 4 MG tablet Take 1 tablet (4 mg total) by mouth every 8 (eight) hours as needed.   oxyCODONE (ROXICODONE) 5 MG/5ML solution Take 4 mLs (4 mg total) by mouth every 4 (four) hours as needed for moderate pain.   pantoprazole (PROTONIX) 40 MG tablet Take 1 tablet (40 mg total) by mouth daily.   polyethylene glycol (MIRALAX / GLYCOLAX) packet Take 17 g by mouth daily as needed for mild constipation.   potassium chloride SA (KLOR-CON M) 20 MEQ tablet Take 1 tablet (20 mEq total) by mouth daily.   revefenacin (YUPELRI) 175 MCG/3ML nebulizer solution Take 3 mLs (175 mcg total) by  nebulization daily.   sodium chloride (OCEAN) 0.65 % SOLN nasal spray Place 1 spray into both nostrils as needed for congestion (nose bleeds).   valACYclovir (VALTREX) 500 MG tablet Take 500 mg by mouth daily as needed (breakouts).    VITAMIN D, CHOLECALCIFEROL, PO Take by mouth daily.   White Petrolatum-Mineral Oil (ARTIFICIAL TEARS) ointment Place into both eyes as needed.   Physical Exam:    VS:  BP 132/72 (BP Location: Left Arm, Patient Position: Sitting, Cuff Size: Normal)   Pulse 69   Ht 5\' 6"  (1.676 m)   Wt 111 lb (50.3 kg)   SpO2 97%   BMI 17.92 kg/m    Wt Readings from  Last 3 Encounters:  05/20/23 111 lb (50.3 kg)  03/11/23 110 lb 12.8 oz (50.3 kg)  02/26/23 108 lb 8 oz (49.2 kg)    GEN: Well nourished, well developed in no acute distress NECK: No JVD; No carotid bruits CARDIAC: RRR, no murmurs, rubs, gallops RESPIRATORY:  Clear to auscultation without rales, wheezing or rhonchi  ABDOMEN: Soft, non-tender, non-distended EXTREMITIES:  No edema; No acute deformity   Asessement and Plan:[name]    Hypertension: She reports elevated blood pressure readings on Monday that fluctuated, she notes that at other doctors visits her blood pressure has been normal. Blood pressure today 132/72, she reports she has not yet taken her lasix and is stressed today.  Encouraged to monitor her blood pressure once or twice a day with consistent timings and to notify the office if consistently elevated above 130/80.  Discussed that increased stress or pain can cause increases in blood pressure.  Chronic HFpEF/chronic dyspnea/ankle edema: Most recent echocardiogram on 01/27/23 showed LVEF of 55 to 60%, LV with normal function, mild LVH, moderately elevated pulmonary artery systolic pressure. She is on Lasix 40 mg and 60 mg alternating every other day. She has been followed by pulmonology, has underlying bronchiectasis. Pulm suspects shortness of breath is multifactorial from moderate restriction on PFTs,  bronchiectasis and deconditioning from chronic back pain. Today she appears euvolemic and well compensated on exam.  She notes occasional lower extremity edema and complains that her left ankle "turns in", she is seeing podiatry for this.  Recommended trying compression stockings and low-sodium diet.  Chest pain, atypical: Patient with history of chronic atypical chest pain. Cardiac cath in 2021 showed mild nonobstructive CAD. She notes one episodes of chest soreness that lasted all day earlier this week, not associated with exertion. Notes the day before she had been lifting water out of her washing machine, pain felt to be musculoskeletal in nature. Denies chest pain with exertion or with walking.   OSA: Patient reports she has been unable to tolerate idea of CPAP, continues to defer study. She plans to follow up with pulmonology to consider other options.   S/p mitral valve repair: Echo on 01/27/23 showed trace mitral regurgitation and no significant mitral stenosis. 32 mm Sorin Memo 4D prosthetic annuloplasty ring in the mitral position. Continue SBE prophylaxis.   Paroxysmal atrial fibrillation: Patient has normal heart rate and rhythm, denies feelings of atrial fibrillation or palpitations. She denies any recent bleeding problems. Continue metoprolol tartrate 25 mg BID and Eliquis 5 mg BID.   Anxiety: Patient reports severe anxiety and stress regarding her medical conditions. She notes she is now seeing a Veterinary surgeon and has been working on medication management, unfortunately these have made her feel overall worse. Encouraged to continue with counselor.   Disposition: F/u with Robet Leu, PA in two months per patient request.   Signed, Rip Harbour, NP

## 2023-05-20 ENCOUNTER — Ambulatory Visit: Payer: Medicare Other | Attending: Cardiology | Admitting: Cardiology

## 2023-05-20 ENCOUNTER — Encounter: Payer: Self-pay | Admitting: Cardiology

## 2023-05-20 VITALS — BP 132/72 | HR 69 | Ht 66.0 in | Wt 111.0 lb

## 2023-05-20 DIAGNOSIS — I10 Essential (primary) hypertension: Secondary | ICD-10-CM | POA: Diagnosis not present

## 2023-05-20 DIAGNOSIS — F32A Depression, unspecified: Secondary | ICD-10-CM

## 2023-05-20 DIAGNOSIS — R0602 Shortness of breath: Secondary | ICD-10-CM | POA: Diagnosis not present

## 2023-05-20 DIAGNOSIS — R0789 Other chest pain: Secondary | ICD-10-CM | POA: Diagnosis not present

## 2023-05-20 DIAGNOSIS — F419 Anxiety disorder, unspecified: Secondary | ICD-10-CM | POA: Diagnosis not present

## 2023-05-20 DIAGNOSIS — Z9889 Other specified postprocedural states: Secondary | ICD-10-CM

## 2023-05-20 DIAGNOSIS — I4891 Unspecified atrial fibrillation: Secondary | ICD-10-CM | POA: Diagnosis not present

## 2023-05-20 DIAGNOSIS — I5032 Chronic diastolic (congestive) heart failure: Secondary | ICD-10-CM

## 2023-05-20 DIAGNOSIS — G4733 Obstructive sleep apnea (adult) (pediatric): Secondary | ICD-10-CM | POA: Diagnosis not present

## 2023-05-20 NOTE — Patient Instructions (Signed)
Medication Instructions:  No changes *If you need a refill on your cardiac medications before your next appointment, please call your pharmacy*  Lab Work: No labs  Testing/Procedures: No testing  Follow-Up: At [facility], you and your health needs are our priority.  As part of our continuing mission to provide you with exceptional heart care, we have created designated Provider Care Teams.  These Care Teams include your primary Cardiologist (physician) and Advanced Practice Providers (APPs -  Physician Assistants and Nurse Practitioners) who all work together to provide you with the care you need, when you need it.  We recommend signing up for the patient portal called "MyChart".  Sign up information is provided on this After Visit Summary.  MyChart is used to connect with patients for Virtual Visits (Telemedicine).  Patients are able to view lab/test results, encounter notes, upcoming appointments, etc.  Non-urgent messages can be sent to your provider as well.   To learn more about what you can do with MyChart, go to [email or web address].    Your next appointment:   2 month(s)  Provider:   Robet Leu, PA-C

## 2023-05-21 ENCOUNTER — Ambulatory Visit: Payer: Medicare Other | Admitting: Podiatry

## 2023-05-21 DIAGNOSIS — H25813 Combined forms of age-related cataract, bilateral: Secondary | ICD-10-CM | POA: Diagnosis not present

## 2023-05-21 DIAGNOSIS — H35372 Puckering of macula, left eye: Secondary | ICD-10-CM | POA: Diagnosis not present

## 2023-05-21 DIAGNOSIS — H43813 Vitreous degeneration, bilateral: Secondary | ICD-10-CM | POA: Diagnosis not present

## 2023-05-23 ENCOUNTER — Other Ambulatory Visit: Payer: Self-pay | Admitting: Pulmonary Disease

## 2023-05-24 DIAGNOSIS — M810 Age-related osteoporosis without current pathological fracture: Secondary | ICD-10-CM | POA: Diagnosis not present

## 2023-05-24 DIAGNOSIS — R911 Solitary pulmonary nodule: Secondary | ICD-10-CM | POA: Diagnosis not present

## 2023-05-24 DIAGNOSIS — E039 Hypothyroidism, unspecified: Secondary | ICD-10-CM | POA: Diagnosis not present

## 2023-05-24 DIAGNOSIS — I499 Cardiac arrhythmia, unspecified: Secondary | ICD-10-CM | POA: Diagnosis not present

## 2023-05-24 DIAGNOSIS — G629 Polyneuropathy, unspecified: Secondary | ICD-10-CM | POA: Diagnosis not present

## 2023-05-24 DIAGNOSIS — I10 Essential (primary) hypertension: Secondary | ICD-10-CM | POA: Diagnosis not present

## 2023-05-24 DIAGNOSIS — K219 Gastro-esophageal reflux disease without esophagitis: Secondary | ICD-10-CM | POA: Diagnosis not present

## 2023-05-24 DIAGNOSIS — N301 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: Secondary | ICD-10-CM | POA: Diagnosis not present

## 2023-05-24 DIAGNOSIS — E43 Unspecified severe protein-calorie malnutrition: Secondary | ICD-10-CM | POA: Diagnosis not present

## 2023-05-25 DIAGNOSIS — M961 Postlaminectomy syndrome, not elsewhere classified: Secondary | ICD-10-CM | POA: Diagnosis not present

## 2023-05-25 DIAGNOSIS — Z79891 Long term (current) use of opiate analgesic: Secondary | ICD-10-CM | POA: Diagnosis not present

## 2023-05-25 DIAGNOSIS — M5412 Radiculopathy, cervical region: Secondary | ICD-10-CM | POA: Diagnosis not present

## 2023-05-25 DIAGNOSIS — M4693 Unspecified inflammatory spondylopathy, cervicothoracic region: Secondary | ICD-10-CM | POA: Diagnosis not present

## 2023-05-25 DIAGNOSIS — G894 Chronic pain syndrome: Secondary | ICD-10-CM | POA: Diagnosis not present

## 2023-05-26 ENCOUNTER — Ambulatory Visit (INDEPENDENT_AMBULATORY_CARE_PROVIDER_SITE_OTHER): Payer: Medicare Other | Admitting: Pulmonary Disease

## 2023-05-26 ENCOUNTER — Encounter: Payer: Self-pay | Admitting: Pulmonary Disease

## 2023-05-26 VITALS — BP 132/62 | HR 68 | Temp 97.5°F | Ht 66.0 in | Wt 109.6 lb

## 2023-05-26 DIAGNOSIS — J479 Bronchiectasis, uncomplicated: Secondary | ICD-10-CM | POA: Diagnosis not present

## 2023-05-26 DIAGNOSIS — R1310 Dysphagia, unspecified: Secondary | ICD-10-CM

## 2023-05-26 NOTE — Progress Notes (Unsigned)
Synopsis: Referred in August 2023 for abnormal CT Chest scan  Subjective:   PATIENT ID: [name] GENDER: female DOB: 10-Mar-1946, MRN: [id number]  HPI  Chief Complaint  Patient presents with   Follow-up    Shortness of breath persistent.    [name] is a 77 year old woman with history of mitral regurgitation s/p minimally invassive MV repair and MAZE procedure complicated by LV free wall rupture 10/2019 and scoliosis with 7 spinal surgeries in 2015 who returns to pulmonary clinic for shortness of breath.   She canceled her in lab CPAP titration study due to panic/anxiety.  She expresses concern for her debilitating anxiety/depression and feels that it is worse than ever before.  She has been using Yupelri nebulizer treatments as needed for her shortness of breath.   OV 09/23/22 She was started on yupelri nebulizer treatments at last visit in attempt to help with her dyspnea given significant bronchodilator response on PFTs. She noted an improvement in her breathing with the nebulizer treatment.   ONO showed 1 hr 51 minutes with SpO2 less than 88%. Her oxygen desaturation index was 37 events per hour. Studies have shown that the ODI is a high predictor of sleep disordered breathing such as sleep apnea. Given her medications and these results, I recommended she be scheduled for an in lab sleep study for further evaluation.   She had issue of epistaxis that she went to the ER on 08/18/22 and had packing in for about 4-5 days. This occurred 2-3 weeks after starting the nebulizer treatments. She has had issues with epistaxis in the past.   OV 07/24/22 She continues to have shortness of breath and she feels it is getting worse. She did not tolerate Stiolto inhaler after last visit due to feeling like she had mucous in her throat. She continues to have issues with swallowing and has been referred to speech therapy by her primary care team.  OV 04/02/22 She did not tolerate stiolto after last  visit as she reports choking issues after using it.  She had modified barium swallow 03/11/22 with no overt issues of dysphagia or aspiration risk. She reported taking TUMS to the speech therapist for chest pains with relief. She did start pantoprazole after last visit which she reports some improvement in her reflux.  She continues to have significant chest, neck, shoulder and back pains.   She is meeting with her surgical team at [facility] and is considering botox injections in her neck.  OV 03/05/22 She reports the dyspnea can occur at rest or with exertion. She has wheezing at night sometimes. She complains of sharp chest pains that are located centrally and goes between her shoulder blades. She has terrible heartburn. She does report trouble swallowing her food/drink. She is using oxycodone every 4 hours for chronic pain due to her neck and back. She has cervical, thoracic and lumbar spine hardware in place.   PFTs 2022 showed normal spirometry but evidence of air trapping and significant bronchodilator response.   She has not noted much benefit from trying albuterol inhaler with her shortness of breath.   She is a former smoker, quit in 1980. She lives alone and is accompanied by her sister today. She is having more difficulty with her ADLs.   Past Medical History:  Diagnosis Date   Allergies    Anxiety    Arthritis    "maybe in my back" (03/31/2018)   Benign paroxysmal positional vertigo 06/08/2013   Complication  of anesthesia    Fracture of multiple ribs 2015   "don't know from what; dx'd when I in hospital for 1st back OR" (03/31/2018)   GERD (gastroesophageal reflux disease)    Hair loss 04/12/2012   Herpes    History of blood transfusion    "twice; related to back OR" (03/31/2018)   History of kidney stones    Interstitial cystitis 11/06/2011   Melanoma of ankle (HCC) ~ 2003   "right"   Mitral regurgitation    Osteopenia 02/18/2012   Osteoporosis    PAF (paroxysmal  atrial fibrillation) (HCC) 2012   Peripheral neuropathy 11/06/2011   PMDD (premenstrual dysphoric disorder)    PONV (postoperative nausea and vomiting)    nausea, vomiting, hives and dizziness    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/2020   Complete bilateral atrial lesion set using cryothermy and bipolar radiofrequency ablation with clipping of LA appendage via right mini-thoracotomy approach   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   Complex valvuloplasty including artificial Gore-tex neochord placement x12 with 32mm Sorin Memo 4D ring annuloplasty   Seasonal allergies    Vaginal delivery    ONE NSVD   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     Family History  Problem Relation Age of Onset   Diabetes Father    Hyperlipidemia Sister    Heart disease Sister    Stroke Sister    Diabetes Brother    Hyperlipidemia Sister    Heart disease Sister    Arthritis Mother    Heart disease Mother    Uterine cancer Mother    Diabetes Brother    Heart Problems Brother      Social History   Socioeconomic History   Marital status: Divorced    Spouse name: none/divorced   Number of children: Not on file   Years of education: Not on file   Highest education level: 12th grade  Occupational History   Occupation: retired  Tobacco Use   Smoking status: Former    Current packs/day: 0.00    Average packs/day: 0.1 packs/day for 14.0 years (1.4 ttl pk-yrs)    Types: Cigarettes    Start date: 1966    Quit date: 1980    Years since quitting: 44.9   Smokeless tobacco: Never   Tobacco comments:    03/31/2018 "quit ~ 1980; someday smoker when I did smoke; never addicted"  Vaping Use   Vaping status: Never Used  Substance and Sexual Activity   Alcohol use: Never   Drug use: Yes    Types: Oxycodone, Benzodiazepines    Comment: 03/31/2018 "for chronic neck and back pain", takes Klonopin at times.    Sexual activity: Not Currently  Other Topics Concern   Not on file  Social History Narrative   Lives  by herself.    Social Determinants of Health   Financial Resource Strain: Low Risk  (10/23/2022)   Overall Financial Resource Strain (CARDIA)    Difficulty of Paying Living Expenses: Not hard at all  Food Insecurity: No Food Insecurity (10/23/2022)   Hunger Vital Sign    Worried About Running Out of Food in the Last Year: Never true    Ran Out of Food in the Last Year: Never true  Transportation Needs: No Transportation Needs (10/23/2022)   PRAPARE - Administrator, Civil Service (Medical): No    Lack of Transportation (Non-Medical): No  Physical Activity: Inactive (10/23/2022)   Exercise Vital Sign    Days of Exercise per Week: 0 days  Minutes of Exercise per Session: 0 min  Stress: Stress Concern Present (10/23/2022)   [employer] of Occupational Health - Occupational Stress Questionnaire    Feeling of Stress : To some extent  Social Connections: Socially Isolated (10/23/2022)   Social Connection and Isolation Panel [NHANES]    Frequency of Communication with Friends and Family: More than three times a week    Frequency of Social Gatherings with Friends and Family: Once a week    Attends Religious Services: Never    Database administrator or Organizations: No    Attends Banker Meetings: Never    Marital Status: Divorced  Catering manager Violence: Not At Risk (01/10/2023)   Received from [employer], [facility]   HITS    Over the last 12 months how often did your partner physically hurt you?: Never    Over the last 12 months how often did your partner insult you or talk down to you?: Never    Over the last 12 months how often did your partner threaten you with physical harm?: Never    Over the last 12 months how often did your partner scream or curse at you?: Never     Allergies  Allergen Reactions   Buprenorphine Nausea Only and Other (See Comments)    sedation and adhesive reaction Other reaction(s): nausea, sedation and adhesive  reaction Other reaction(s): nausea, sedation and adhesive reaction, Other (See Comments), Other (See Comments) sedation and adhesive reaction Other reaction(s): nausea, sedation and adhesive reaction sedation and adhesive reaction Other reaction(s): nausea, sedation and adhesive reaction sedation and adhesive reaction Other reaction(s): nausea, sedation and adhesive reaction sedation and adhesive reaction Other reaction(s): nausea, sedation and adhesive reaction sedation and adhesive reaction Other reaction(s): nausea, sedation and adhesive reaction sedation and adhesive reaction Other reaction(s): nausea, sedation and adhesive reaction   Erythromycin Nausea And Vomiting and Other (See Comments)    Dizziness  Other reaction(s): Other (See Comments) Dizziness  unknown unknown Dizziness  BURNING ALL OVER  BURNING ALL OVER  unknown Dizziness  BURNING ALL OVER    Iodinated Contrast Media Hives, Nausea Only and Rash    unknown unknown unknown   Latex Palpitations, Other (See Comments) and Rash     hives and sores Other reaction(s): hives and sores Other reaction(s):  hives and sores, Other (See Comments), Other (See Comments)  hives and sores Other reaction(s): hives and sores hives hives  hives and sores Other reaction(s): hives and sores hives Sores  hives and sores Other reaction(s): hives and sores hives Sores  hives and sores Other reaction(s): hives and sores hives  hives and sores Other reaction(s): hives and sores hives Sores  hives and sores Other reaction(s): hives and sores   Other Other (See Comments)    CAN NOT TAKE , aLPRAZOLAM, OR ELAVIL DUE TO AFIB FOR ANXIETY  IV CONTRAST /"DYE". Other reaction(s): hives and sores Other reaction(s): Other (See Comments), Other (See Comments) CAN NOT TAKE , aLPRAZOLAM, OR ELAVIL DUE TO AFIB FOR ANXIETY  IV CONTRAST /"DYE". Other reaction(s): hives and sores IV CONTRAST /"DYE". CAN NOT TAKE , aLPRAZOLAM, OR  ELAVIL DUE TO AFIB FOR ANXIETY  IV CONTRAST /"DYE". Other reaction(s): hives and sores Patient states that every time she has anesthesia, she wakes up vomiting, has chills, blood pressure drops CAN NOT TAKE , aLPRAZOLAM, OR ELAVIL DUE TO AFIB FOR ANXIETY  IV CONTRAST /"DYE". Other reaction(s): hives and sores IV CONTRAST /"DYE". Patient states that every time she  has anesthesia, she wakes up vomiting, has chills, blood pressure drops CAN NOT TAKE , aLPRAZOLAM, OR ELAVIL DUE TO AFIB FOR ANXIETY  IV CONTRAST /"DYE". Other reaction(s): hives and sores IV CONTRAST /"DYE". CAN NOT TAKE , aLPRAZOLAM, OR ELAVIL DUE TO AFIB FOR ANXIETY  IV CONTRAST /"DYE". Other reaction(s): hives and sores Patient states that every time she has anesthesia, she wakes up vomiting, has chills, blood pressure drops CAN NOT TAKE , aLPRAZOLAM, OR ELAVIL DUE TO AFIB FOR ANXIETY  IV CONTRAST /"DYE". Other reaction(s): hives and sores   Tape Rash, Other (See Comments) and Itching    Heart monitor stickers must be rotated in order to prevent rash Other reaction(s): hives and sores Adhesive on EKG tabs=BURNS SKIN**PAPER TAPE OK** sores Adhesive on EKG tabs. Per Pt, Ok w/Paper Tape Adhesive on EKG tabs=BURNS SKIN**PAPER TAPE OK** sores Other reaction(s): Other (See Comments) Heart monitor stickers must be rotated in order to prevent rash Adhesive on EKG tabs=BURNS SKIN**PAPER TAPE OK** sores   Ciprofloxacin Other (See Comments)    Other reaction(s): Other (See Comments) Burning all over Burning all over Burning all over   Duloxetine Hcl     Severe diarrhea and upset stomach Other reaction(s): Other (See Comments), Unknown Severe diarrhea and upset stomach Severe diarrhea and upset stomach Severe diarrhea and upset stomach   Erythromycin Base     Other reaction(s): Other (See Comments), Unknown   Hydrocodone Other (See Comments)    Sedation,dizziness, and nausea Other reaction(s): sedation and  nausea Other reaction(s): sedation and nausea Other reaction(s): Other (See Comments), Other (See Comments), sedation and nausea Sedation,dizziness, and nausea Other reaction(s): sedation and nausea Other reaction(s): sedation and nausea Sedation,dizziness, and nausea Other reaction(s): sedation and nausea Other reaction(s): sedation and nausea Sedation,dizziness, and nausea Other reaction(s): sedation and nausea Other reaction(s): sedation and nausea Sedation,dizziness, and nausea Other reaction(s): sedation and nausea Other reaction(s): sedation and nausea Sedation,dizziness, and nausea Other reaction(s): sedation and nausea Other reaction(s): sedation and nausea Sedation,dizziness, and nausea Other reaction(s): sedation and nausea Other reaction(s): sedation and nausea   Hydromorphone Other (See Comments)    Sedation,dizziness, and nausea Other reaction(s): sedation and nausea Other reaction(s): sedation and nausea Other reaction(s): Other (See Comments), Other (See Comments), sedation and nausea Sedation,dizziness, and nausea Other reaction(s): sedation and nausea Other reaction(s): sedation and nausea Sedation,dizziness, and nausea Other reaction(s): sedation and nausea Other reaction(s): sedation and nausea Sedation,dizziness, and nausea Other reaction(s): sedation and nausea Other reaction(s): sedation and nausea Sedation,dizziness, and nausea Other reaction(s): sedation and nausea Other reaction(s): sedation and nausea Sedation,dizziness, and nausea Other reaction(s): sedation and nausea Other reaction(s): sedation and nausea Sedation,dizziness, and nausea Other reaction(s): sedation and nausea Other reaction(s): sedation and nausea   Iodine     unknown Other reaction(s): Other (See Comments) unknown unknown unknown unknown unknown unknown   Levofloxacin Nausea And Vomiting and Nausea Only   Metrizamide Hives   Nsaids     CANNOT TAKE PER CARDIOLOGIST  DUE TO AFIB    Nucynta [Tapentadol Hcl]     nausea and sedation   Oxycodone Other (See Comments)    Delusions (intolerance)  PILLS ONLY sedation, dizziness, nausea,  and itching Other reaction(s): sedation and nausea Other reaction(s): sedation and nausea Other reaction(s): Hallucinations, Other Other reaction(s): Other (See Comments), sedation and nausea Delusions (intolerance)  PILLS ONLY sedation, dizziness, nausea,  and itching Other reaction(s): sedation and nausea Other reaction(s): sedation and nausea Delusions (intolerance)  PILLS ONLY sedation, dizziness, nausea,  and itching Other  reaction(s): sedation and nausea Other reaction(s): sedation and nausea Delusions (intolerance)  PILLS ONLY sedation, dizziness, nausea,  and itching Other reaction(s): sedation and nausea Other reaction(s): sedation and nausea Delusions (intolerance)  PILLS ONLY sedation, dizziness, nausea,  and itching Other reaction(s): sedation and nausea Other reaction(s): sedation and nausea Delusions (intolerance)  PILLS ONLY sedation, dizziness, nausea,  and itching Other reaction(s): sedation and nausea Other reaction(s): sedation and nausea Delusions (intolerance)  PILLS ONLY sedation, dizziness, nausea,  and itching Other reaction(s): sedation and nausea Other reaction(s): sedation and nausea   Pentazocine Other (See Comments)    Unknown Other reaction(s): Unknown Other reaction(s): Unknown Other reaction(s): Other (See Comments), Other (See Comments), Unknown Unknown Other reaction(s): Unknown Other reaction(s): Unknown Unknown Other reaction(s): Unknown Other reaction(s): Unknown Unknown Other reaction(s): Unknown Other reaction(s): Unknown Unknown Other reaction(s): Unknown Other reaction(s): Unknown Unknown Other reaction(s): Unknown Other reaction(s): Unknown Unknown Other reaction(s): Unknown Other reaction(s): Unknown   Septra [Bactrim]     Hives    Sulfasalazine Hives    Morphine Anxiety, Other (See Comments) and Nausea Only    sedation and nausea Other reaction(s): sedation and nausea Other reaction(s): sedation and nausea Other reaction(s): Delusions (intolerance), Other (See Comments), Other (See Comments), sedation and nausea sedation and nausea Other reaction(s): sedation and nausea Other reaction(s): sedation and nausea sedation and nausea Other reaction(s): sedation and nausea Other reaction(s): sedation and nausea PT CANNOT WAKE UP AFTER TAKING MEDICATION AND HAS NIGHTMARES  sedation and nausea Other reaction(s): sedation and nausea Other reaction(s): sedation and nausea PT CANNOT WAKE UP AFTER TAKING MEDICATION AND HAS NIGHTMARES  sedation and nausea Other reaction(s): sedation and nausea Other reaction(s): sedation and nausea sedation and nausea Other reaction(s): sedation and nausea Other reaction(s): sedation and nausea PT CANNOT WAKE UP AFTER TAKING MEDICATION AND HAS NIGHTMARES  sedation and nausea Other reaction(s): sedation and nausea Other reaction(s): sedation and nausea   Sulfa Antibiotics Hives, Nausea Only and Rash    rash rash rash   Sulfamethoxazole-Trimethoprim Rash    Hives  Hives  Hives  Hives  Hives  Hives    Tapentadol Nausea Only    Other reaction(s): nausea and sedation, Other (See Comments) nausea and sedation nausea and sedation     Outpatient Medications Prior to Visit  Medication Sig Dispense Refill   acetaminophen (TYLENOL) 500 MG tablet Take 500 mg by mouth every 6 (six) hours as needed for moderate pain.      albuterol (VENTOLIN HFA) 108 (90 Base) MCG/ACT inhaler Inhale 1-2 puffs into the lungs every 6 (six) hours as needed. 8 g 2   apixaban (ELIQUIS) 5 MG TABS tablet Take 1 tablet (5 mg total) by mouth 2 (two) times daily. 60 tablet 5   Ascorbic Acid (VITAMIN C PO) Take by mouth daily.     b complex vitamins tablet Take 1 tablet by mouth daily.     Calcium Carbonate Antacid (TUMS CHEWY BITES PO)  Take 1 tablet by mouth daily as needed (reflux).      Calcium Citrate-Vitamin D (CALCIUM + D PO) Take 1 tablet by mouth daily.     Carboxymethylcellul-Glycerin (LUBRICATING EYE DROPS OP) Place 1 drop into both eyes daily as needed (dry eyes).     cephALEXin (KEFLEX) 500 MG capsule Take 2 grams (4 tablets) by mouth 1 hour prior to dental cleaning/procedure. 4 capsule 2   clonazePAM (KLONOPIN) 0.5 MG tablet Take 1 tablet by mouth twice daily as needed for anxiety 30 tablet 3   denosumab (  PROLIA) 60 MG/ML SOSY injection Inject 60 mg into the skin every 6 (six) months.     furosemide (LASIX) 20 MG tablet Take 1 tablet (20 mg total) by mouth every other day. 90 tablet 3   furosemide (LASIX) 40 MG tablet Take 1.5 tablets (60 mg total) by mouth as directed. Take 1.5 tablet by mouth on Tuesday, Thursday, and Saturday. 90 tablet 3   levothyroxine (SYNTHROID) 75 MCG tablet Take 1 tablet by mouth once daily 90 tablet 0   meclizine (ANTIVERT) 25 MG tablet TAKE 1 TABLET BY MOUTH THREE TIMES DAILY AS NEEDED FOR DIZZINESS 45 tablet 1   metoprolol tartrate (LOPRESSOR) 25 MG tablet Take 1 tablet (25 mg total) by mouth 2 (two) times daily. 180 tablet 3   mupirocin ointment (BACTROBAN) 2 % Apply 1 Application topically daily.     Nutritional Supplements (,FEEDING SUPPLEMENT, PROSOURCE PLUS) liquid Take 30 mLs by mouth 2 (two) times daily between meals. 887 mL 0   nystatin (MYCOSTATIN/NYSTOP) powder Apply topically 2 (two) times daily as needed (skin irritation (under breasts)). 15 g 0   ondansetron (ZOFRAN) 4 MG tablet Take 1 tablet (4 mg total) by mouth every 8 (eight) hours as needed. 45 tablet 1   oxyCODONE (ROXICODONE) 5 MG/5ML solution Take 4 mLs (4 mg total) by mouth every 4 (four) hours as needed for moderate pain.  0   pantoprazole (PROTONIX) 40 MG tablet Take 1 tablet (40 mg total) by mouth daily. 30 tablet 5   polyethylene glycol (MIRALAX / GLYCOLAX) packet Take 17 g by mouth daily as needed for mild  constipation.     potassium chloride SA (KLOR-CON M) 20 MEQ tablet Take 1 tablet (20 mEq total) by mouth daily. 90 tablet 3   revefenacin (YUPELRI) 175 MCG/3ML nebulizer solution Take 3 mLs (175 mcg total) by nebulization daily. 90 mL 11   sodium chloride (OCEAN) 0.65 % SOLN nasal spray Place 1 spray into both nostrils as needed for congestion (nose bleeds).     valACYclovir (VALTREX) 500 MG tablet Take 500 mg by mouth daily as needed (breakouts).      VITAMIN D, CHOLECALCIFEROL, PO Take by mouth daily.     White Petrolatum-Mineral Oil (ARTIFICIAL TEARS) ointment Place into both eyes as needed.     doxepin (SINEQUAN) 10 MG capsule Take 10 mg by mouth at bedtime. (Patient not taking: Reported on 05/26/2023)     DULoxetine (CYMBALTA) 60 MG capsule Take 60 mg by mouth daily. (Patient not taking: Reported on 05/26/2023)     No facility-administered medications prior to visit.   Review of Systems  Constitutional:  Negative for chills, fever, malaise/fatigue and weight loss.  HENT:  Negative for congestion, sinus pain and sore throat.   Eyes: Negative.   Respiratory:  Positive for shortness of breath. Negative for cough, hemoptysis, sputum production and wheezing.   Cardiovascular:  Positive for chest pain. Negative for palpitations, orthopnea, claudication and leg swelling.  Gastrointestinal:  Positive for heartburn. Negative for abdominal pain, nausea and vomiting.  Genitourinary: Negative.   Musculoskeletal:  Negative for joint pain and myalgias.  Skin:  Negative for rash.  Neurological:  Negative for weakness.  Endo/Heme/Allergies: Negative.   Psychiatric/Behavioral:  The patient is nervous/anxious.    Objective:   Vitals:   05/26/23 1614  BP: 132/62  Pulse: 68  Temp: (!) 97.5 F (36.4 C)  TempSrc: Oral  SpO2: 93%  Weight: 109 lb 9.6 oz (49.7 kg)  Height: 5\' 6"  (1.676 m)  Physical Exam Constitutional:      General: She is not in acute distress.    Appearance: She is not  ill-appearing.  HENT:     Head: Normocephalic and atraumatic.  Eyes:     General: No scleral icterus. Cardiovascular:     Rate and Rhythm: Normal rate and regular rhythm.     Pulses: Normal pulses.     Heart sounds: Normal heart sounds. No murmur heard. Pulmonary:     Effort: Pulmonary effort is normal.     Breath sounds: Normal breath sounds. No wheezing, rhonchi or rales.  Musculoskeletal:     Right lower leg: No edema.     Left lower leg: No edema.  Skin:    General: Skin is warm and dry.  Neurological:     General: No focal deficit present.     Mental Status: She is alert.    CBC    Component Value Date/Time   WBC 5.9 10/03/2022 1614   WBC 7.2 08/20/2022 1508   RBC 4.33 10/03/2022 1614   RBC 4.58 08/20/2022 1508   HGB 13.0 10/03/2022 1614   HCT 38.6 10/03/2022 1614   PLT 173 10/03/2022 1614   MCV 89 10/03/2022 1614   MCH 30.0 10/03/2022 1614   MCH 25.9 (L) 02/06/2020 0554   MCHC 33.7 10/03/2022 1614   MCHC 33.2 08/20/2022 1508   RDW 12.3 10/03/2022 1614   LYMPHSABS 1.8 08/20/2022 1508   MONOABS 0.7 08/20/2022 1508   EOSABS 0.2 08/20/2022 1508   BASOSABS 0.0 08/20/2022 1508      Latest Ref Rng & Units 01/22/2023   12:00 AM 11/04/2022    3:39 PM 10/03/2022    4:14 PM  BMP  Glucose 70 - 99 mg/dL 75  92  86   BUN 8 - 27 mg/dL 23  22  21    Creatinine 0.57 - 1.00 mg/dL 8.29  5.62  1.30   BUN/Creat Ratio 12 - 28 27  26  28    Sodium 134 - 144 mmol/L 145  142  142   Potassium 3.5 - 5.2 mmol/L 4.9  4.7  4.6   Chloride 96 - 106 mmol/L 101  98  96   CO2 20 - 29 mmol/L 33  32  28   Calcium 8.7 - 10.3 mg/dL 9.1  9.8  9.6    Chest imaging: CT Chest 02/13/22 1. Right middle lobe and left upper lobe tree-in-bud nodularity consistent with atypical infection. 2. Interval development of a 6x4 mm subpleural nodule along the left major fissure. No follow-up needed if patient is low-risk.This recommendation follows the consensus statement: Guidelines for Management of  Incidental Pulmonary Nodules Detected on CT Images: From the Fleischner Society 2017; Radiology 2017; 284:228-243. 3. Cardiomegaly. 4. Mitral valve replacement.  PFT:    Latest Ref Rng & Units 01/01/2021   11:49 AM 12/01/2019    9:39 AM  PFT Results  FVC-Pre L 1.55  2.13   FVC-Predicted Pre % 51  69   FVC-Post L 1.67    FVC-Predicted Post % 55    Pre FEV1/FVC % % 74  74   Post FEV1/FCV % % 81    FEV1-Pre L 1.15  1.57   FEV1-Predicted Pre % 50  68   FEV1-Post L 1.36    DLCO uncorrected ml/min/mmHg 21.48  14.09   DLCO UNC% % 105  68   DLCO corrected ml/min/mmHg 22.12  14.56   DLCO COR %Predicted % 108  71   DLVA Predicted %  110  105   TLC L 4.50  5.36   TLC % Predicted % 84  100   RV % Predicted % 131  140     Labs:  Path:  Echo 02/11/22: LV EF 50-55%. RV systolic function is moderately reduced. RV size is moderately enlarged. Mildly elevated pulmonary artery systolic pressure 44.84mmHg. RA mildly dilated.  Heart Catheterization:  Assessment & Plan:   No diagnosis found.  Discussion: [name] is a 77 year old woman with history of mitral regurgitation s/p minimally invassive MV repair and MAZE procedure complicated by LV free wall rupture 10/2019 and scoliosis with 7 spinal surgeries in 2015 who returns to pulmonary clinic for shortness of breath.   Her bronchiectasis is very mild based on CT imaging. This is not the etiology of her shortness of breath. PFTs are within normal limits with positive bronchodilator response. She is to continue yupelri nebulizer treatment daily as she noted improvement in her dyspnea.   She has moderate obstructive sleep apnea based on home sleep study 01/13/2023 and was recommended for in lab CPAP titration due to her history of nocturnal hypoxemia.  She is too overwhelmed at this time due to anxiety/depression to complete the study.  This is a very complicated patient to treat due to her debilitating anxiety/depression.  We will hold off on  initiating nocturnal oxygen therapy at this time given her epistaxis issues. She is to use saline nasal spray and vaseline per ENT recommendations.  She has history of complete spinal stenosis which is likely leading to some degree of restriction in her chest wall mobility that can add to her shortness of breath.   Follow up in 1 year call sooner if needed  Melody Comas, MD [facility] Office: [phone]    Current Outpatient Medications:    acetaminophen (TYLENOL) 500 MG tablet, Take 500 mg by mouth every 6 (six) hours as needed for moderate pain. , Disp: , Rfl:    albuterol (VENTOLIN HFA) 108 (90 Base) MCG/ACT inhaler, Inhale 1-2 puffs into the lungs every 6 (six) hours as needed., Disp: 8 g, Rfl: 2   apixaban (ELIQUIS) 5 MG TABS tablet, Take 1 tablet (5 mg total) by mouth 2 (two) times daily., Disp: 60 tablet, Rfl: 5   Ascorbic Acid (VITAMIN C PO), Take by mouth daily., Disp: , Rfl:    b complex vitamins tablet, Take 1 tablet by mouth daily., Disp: , Rfl:    Calcium Carbonate Antacid (TUMS CHEWY BITES PO), Take 1 tablet by mouth daily as needed (reflux). , Disp: , Rfl:    Calcium Citrate-Vitamin D (CALCIUM + D PO), Take 1 tablet by mouth daily., Disp: , Rfl:    Carboxymethylcellul-Glycerin (LUBRICATING EYE DROPS OP), Place 1 drop into both eyes daily as needed (dry eyes)., Disp: , Rfl:    cephALEXin (KEFLEX) 500 MG capsule, Take 2 grams (4 tablets) by mouth 1 hour prior to dental cleaning/procedure., Disp: 4 capsule, Rfl: 2   clonazePAM (KLONOPIN) 0.5 MG tablet, Take 1 tablet by mouth twice daily as needed for anxiety, Disp: 30 tablet, Rfl: 3   denosumab (PROLIA) 60 MG/ML SOSY injection, Inject 60 mg into the skin every 6 (six) months., Disp: , Rfl:    furosemide (LASIX) 20 MG tablet, Take 1 tablet (20 mg total) by mouth every other day., Disp: 90 tablet, Rfl: 3   furosemide (LASIX) 40 MG tablet, Take 1.5 tablets (60 mg total) by mouth as directed. Take 1.5  tablet  by mouth on Tuesday, Thursday, and Saturday., Disp: 90 tablet, Rfl: 3   levothyroxine (SYNTHROID) 75 MCG tablet, Take 1 tablet by mouth once daily, Disp: 90 tablet, Rfl: 0   meclizine (ANTIVERT) 25 MG tablet, TAKE 1 TABLET BY MOUTH THREE TIMES DAILY AS NEEDED FOR DIZZINESS, Disp: 45 tablet, Rfl: 1   metoprolol tartrate (LOPRESSOR) 25 MG tablet, Take 1 tablet (25 mg total) by mouth 2 (two) times daily., Disp: 180 tablet, Rfl: 3   mupirocin ointment (BACTROBAN) 2 %, Apply 1 Application topically daily., Disp: , Rfl:    Nutritional Supplements (,FEEDING SUPPLEMENT, PROSOURCE PLUS) liquid, Take 30 mLs by mouth 2 (two) times daily between meals., Disp: 887 mL, Rfl: 0   nystatin (MYCOSTATIN/NYSTOP) powder, Apply topically 2 (two) times daily as needed (skin irritation (under breasts))., Disp: 15 g, Rfl: 0   ondansetron (ZOFRAN) 4 MG tablet, Take 1 tablet (4 mg total) by mouth every 8 (eight) hours as needed., Disp: 45 tablet, Rfl: 1   oxyCODONE (ROXICODONE) 5 MG/5ML solution, Take 4 mLs (4 mg total) by mouth every 4 (four) hours as needed for moderate pain., Disp: , Rfl: 0   pantoprazole (PROTONIX) 40 MG tablet, Take 1 tablet (40 mg total) by mouth daily., Disp: 30 tablet, Rfl: 5   polyethylene glycol (MIRALAX / GLYCOLAX) packet, Take 17 g by mouth daily as needed for mild constipation., Disp: , Rfl:    potassium chloride SA (KLOR-CON M) 20 MEQ tablet, Take 1 tablet (20 mEq total) by mouth daily., Disp: 90 tablet, Rfl: 3   revefenacin (YUPELRI) 175 MCG/3ML nebulizer solution, Take 3 mLs (175 mcg total) by nebulization daily., Disp: 90 mL, Rfl: 11   sodium chloride (OCEAN) 0.65 % SOLN nasal spray, Place 1 spray into both nostrils as needed for congestion (nose bleeds)., Disp: , Rfl:    valACYclovir (VALTREX) 500 MG tablet, Take 500 mg by mouth daily as needed (breakouts). , Disp: , Rfl:    VITAMIN D, CHOLECALCIFEROL, PO, Take by mouth daily., Disp: , Rfl:    White Petrolatum-Mineral Oil (ARTIFICIAL  TEARS) ointment, Place into both eyes as needed., Disp: , Rfl:    doxepin (SINEQUAN) 10 MG capsule, Take 10 mg by mouth at bedtime. (Patient not taking: Reported on 05/26/2023), Disp: , Rfl:    DULoxetine (CYMBALTA) 60 MG capsule, Take 60 mg by mouth daily. (Patient not taking: Reported on 05/26/2023), Disp: , Rfl:   -

## 2023-05-26 NOTE — Patient Instructions (Signed)
You have mild bronchiectasis in your lungs - this is thickening of the airway walls and dilation of the airways  Continue yupelri nebulizer treatments  We will schedule you for modified barium swallow to evaluate your swallowing troubles  Follow up in 6 months

## 2023-05-27 ENCOUNTER — Encounter: Payer: Self-pay | Admitting: Pulmonary Disease

## 2023-05-27 ENCOUNTER — Telehealth: Payer: Self-pay | Admitting: Cardiology

## 2023-05-27 ENCOUNTER — Other Ambulatory Visit (HOSPITAL_COMMUNITY): Payer: Self-pay | Admitting: Pulmonary Disease

## 2023-05-27 ENCOUNTER — Telehealth: Payer: Self-pay | Admitting: Pulmonary Disease

## 2023-05-27 DIAGNOSIS — R040 Epistaxis: Secondary | ICD-10-CM | POA: Diagnosis not present

## 2023-05-27 DIAGNOSIS — R059 Cough, unspecified: Secondary | ICD-10-CM

## 2023-05-27 DIAGNOSIS — F411 Generalized anxiety disorder: Secondary | ICD-10-CM | POA: Diagnosis not present

## 2023-05-27 DIAGNOSIS — K219 Gastro-esophageal reflux disease without esophagitis: Secondary | ICD-10-CM

## 2023-05-27 DIAGNOSIS — R1310 Dysphagia, unspecified: Secondary | ICD-10-CM

## 2023-05-27 NOTE — Telephone Encounter (Signed)
Pt c/o medication issue:  1. Name of Medication:       apixaban (ELIQUIS) 5 MG TABS tablet  2. How are you currently taking this medication (dosage and times per day)? As written  3. Are you having a reaction (difficulty breathing--STAT)? no  4. What is your medication issue? Pt is at [facility] and they are telling her to hold her Eliquis and pt wants to talk to a nurse here about this

## 2023-05-27 NOTE — Telephone Encounter (Signed)
pantoprazole (PROTONIX) 40 MG tablet [[id number]]   PT was in to see Dr. yesterday and said this RX had expired and she needs a new one called in to [facility] on [address] in [place]

## 2023-05-27 NOTE — Telephone Encounter (Signed)
Spoke with pt, Aware of dr Ludwig Clarks recommendations. She uses humidifiers and nasal sprays. Aware she may need to have another procedure if they keep happening.

## 2023-05-27 NOTE — Telephone Encounter (Signed)
Patient identification verified by 2 forms. Marilynn Rail, RN    Called and spoke to patient  Patient states:   -Has a lot of nose bleeds every winter   -she had a really bad nose bleed this morning   -she was able slow down bleeding so far, by placing a disc   -she had follow up with ENT today   -missed morning dose of Eliquis today   -ENT provider recommended she hold the Eliquis for 2-3days  Informed message sent to Dr. Jens Som for input/advisement  Patient has no further questions at this time

## 2023-05-28 DIAGNOSIS — G629 Polyneuropathy, unspecified: Secondary | ICD-10-CM | POA: Diagnosis not present

## 2023-05-28 DIAGNOSIS — E43 Unspecified severe protein-calorie malnutrition: Secondary | ICD-10-CM | POA: Diagnosis not present

## 2023-05-28 DIAGNOSIS — I499 Cardiac arrhythmia, unspecified: Secondary | ICD-10-CM | POA: Diagnosis not present

## 2023-05-28 DIAGNOSIS — M810 Age-related osteoporosis without current pathological fracture: Secondary | ICD-10-CM | POA: Diagnosis not present

## 2023-05-28 DIAGNOSIS — I10 Essential (primary) hypertension: Secondary | ICD-10-CM | POA: Diagnosis not present

## 2023-05-28 DIAGNOSIS — R911 Solitary pulmonary nodule: Secondary | ICD-10-CM | POA: Diagnosis not present

## 2023-05-28 DIAGNOSIS — N301 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: Secondary | ICD-10-CM | POA: Diagnosis not present

## 2023-05-28 DIAGNOSIS — E039 Hypothyroidism, unspecified: Secondary | ICD-10-CM | POA: Diagnosis not present

## 2023-05-28 DIAGNOSIS — K219 Gastro-esophageal reflux disease without esophagitis: Secondary | ICD-10-CM | POA: Diagnosis not present

## 2023-05-28 MED ORDER — PANTOPRAZOLE SODIUM 40 MG PO TBEC: 40.0000 mg | DELAYED_RELEASE_TABLET | Freq: Every day | ORAL | 5 refills | Status: DC

## 2023-05-28 NOTE — Telephone Encounter (Signed)
Patient needs prescription renewal of pantoprazole (PROTONIX) 40 MG tablet. Patient is completely out of medicine.    Ph[facility]acy : [facility] in [place]

## 2023-05-28 NOTE — Telephone Encounter (Signed)
Spoke with patient regarding prior message.Let patient know script has been sent over to her pharmacy .   Patient's voice was understanding.Nothing else further needed.

## 2023-05-29 ENCOUNTER — Telehealth: Payer: Self-pay | Admitting: Cardiology

## 2023-05-29 DIAGNOSIS — Z7901 Long term (current) use of anticoagulants: Secondary | ICD-10-CM | POA: Diagnosis not present

## 2023-05-29 DIAGNOSIS — F419 Anxiety disorder, unspecified: Secondary | ICD-10-CM | POA: Diagnosis not present

## 2023-05-29 DIAGNOSIS — R079 Chest pain, unspecified: Secondary | ICD-10-CM | POA: Diagnosis not present

## 2023-05-29 DIAGNOSIS — I4891 Unspecified atrial fibrillation: Secondary | ICD-10-CM | POA: Diagnosis not present

## 2023-05-29 DIAGNOSIS — I482 Chronic atrial fibrillation, unspecified: Secondary | ICD-10-CM | POA: Diagnosis not present

## 2023-05-29 DIAGNOSIS — R450 Nervousness: Secondary | ICD-10-CM | POA: Diagnosis not present

## 2023-05-29 DIAGNOSIS — R9431 Abnormal electrocardiogram [ECG] [EKG]: Secondary | ICD-10-CM | POA: Diagnosis not present

## 2023-05-29 DIAGNOSIS — Z87891 Personal history of nicotine dependence: Secondary | ICD-10-CM | POA: Diagnosis not present

## 2023-05-29 NOTE — Telephone Encounter (Signed)
Lewayne Bunting, MD  to Freddi Starr, RN     05/27/23  9:44 AM Hold apixaban for 3 to 4 days then resume.  If nosebleeds become a problem can consider referral for Watchman device. Olga Millers ________________________________________________________________ Patient identification verified by 2 forms. Marilynn Rail, RN    Called and spoke to patient  Patient states:   -unsure of when to restart Eliquis Rx -has lumps on either side of ankles, left ankle has for more swelling than the right  -spoke to provider about ankles at 11/13 visit  -ankle swelling on going for 2-78months  Advised patient:  -resume eliquis Saturday morning  -can continue to monitor swelling and follow provider recommendations  Patient agrees with plan, no questions at this time

## 2023-05-29 NOTE — Telephone Encounter (Signed)
Pt states she has been stressed not taking her Eliquis and would like to speak with nurse again about that and her nosebleeds.

## 2023-05-30 DIAGNOSIS — R079 Chest pain, unspecified: Secondary | ICD-10-CM | POA: Diagnosis not present

## 2023-06-01 ENCOUNTER — Telehealth: Payer: Self-pay | Admitting: Cardiology

## 2023-06-01 NOTE — Telephone Encounter (Signed)
Patient identification verified by 2 forms. Jeb Levering, RN     Called and spoke to patient  Patient states: She went to the ED this weekend for elevated heart rate.  She was unsure if she needed to stop all heart medications, she stopped Metoprolol and Eliquis. Pt was given prescribed dose of Metoprolol in the ED. Blood pressure readings today.  9am : 142/77 HR 56 10am: 117/73 HR 57 Pt states it's getting better but not back to normal.  Patient denies: Any nose bleeds since Friday.              Interventions/Plan: Pt advised to continue monitoring her blood pressure and heart rate daily.    Reviewed ED warning signs/precautions  Patient agrees with plan, no questions at this time

## 2023-06-01 NOTE — Telephone Encounter (Signed)
Patient called to talk with Dr. Jens Som or Stanton Kidney discuss visit to ER this weekend due to afib and high heart rate. Please call back to discuss

## 2023-06-01 NOTE — Telephone Encounter (Signed)
Spoke with pt, she reports that the nose bleeds have stopped. She has restarted the metoprolol and eliquis.

## 2023-06-06 DIAGNOSIS — R911 Solitary pulmonary nodule: Secondary | ICD-10-CM | POA: Diagnosis not present

## 2023-06-06 DIAGNOSIS — E43 Unspecified severe protein-calorie malnutrition: Secondary | ICD-10-CM | POA: Diagnosis not present

## 2023-06-06 DIAGNOSIS — I10 Essential (primary) hypertension: Secondary | ICD-10-CM | POA: Diagnosis not present

## 2023-06-06 DIAGNOSIS — M810 Age-related osteoporosis without current pathological fracture: Secondary | ICD-10-CM | POA: Diagnosis not present

## 2023-06-06 DIAGNOSIS — G629 Polyneuropathy, unspecified: Secondary | ICD-10-CM | POA: Diagnosis not present

## 2023-06-06 DIAGNOSIS — E039 Hypothyroidism, unspecified: Secondary | ICD-10-CM | POA: Diagnosis not present

## 2023-06-06 DIAGNOSIS — N301 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: Secondary | ICD-10-CM | POA: Diagnosis not present

## 2023-06-06 DIAGNOSIS — K219 Gastro-esophageal reflux disease without esophagitis: Secondary | ICD-10-CM | POA: Diagnosis not present

## 2023-06-06 DIAGNOSIS — I499 Cardiac arrhythmia, unspecified: Secondary | ICD-10-CM | POA: Diagnosis not present

## 2023-06-10 DIAGNOSIS — Z5181 Encounter for therapeutic drug level monitoring: Secondary | ICD-10-CM | POA: Diagnosis not present

## 2023-06-10 DIAGNOSIS — M810 Age-related osteoporosis without current pathological fracture: Secondary | ICD-10-CM | POA: Diagnosis not present

## 2023-06-11 ENCOUNTER — Telehealth: Payer: Self-pay | Admitting: Family Medicine

## 2023-06-11 NOTE — Telephone Encounter (Signed)
Forms placed in folder at nurse station

## 2023-06-11 NOTE — Telephone Encounter (Signed)
Reviewed forms, no action needed.  Sent to scan

## 2023-06-11 NOTE — Telephone Encounter (Signed)
Type of form received:Provider Note   Additional comments:   Received UJ:[id number] - Front Desk  Form should be Faxed to: fax # [phone]  Is patient requesting call for pickup:N/A  Form placed: [employer] charge sheet.  Provider will determine charge.  Individual made aware of 3-5 business day turn around Yes?

## 2023-06-12 ENCOUNTER — Ambulatory Visit (HOSPITAL_COMMUNITY)
Admission: RE | Admit: 2023-06-12 | Discharge: 2023-06-12 | Disposition: A | Discharge status: Home / Self Care | Payer: Medicare Other | Source: Ambulatory Visit | Attending: Family Medicine | Admitting: Family Medicine

## 2023-06-12 ENCOUNTER — Other Ambulatory Visit: Payer: Self-pay | Admitting: Cardiology

## 2023-06-12 DIAGNOSIS — R1312 Dysphagia, oropharyngeal phase: Secondary | ICD-10-CM | POA: Insufficient documentation

## 2023-06-12 DIAGNOSIS — R1310 Dysphagia, unspecified: Secondary | ICD-10-CM

## 2023-06-12 DIAGNOSIS — I480 Paroxysmal atrial fibrillation: Secondary | ICD-10-CM

## 2023-06-12 DIAGNOSIS — R059 Cough, unspecified: Secondary | ICD-10-CM | POA: Diagnosis not present

## 2023-06-12 DIAGNOSIS — J479 Bronchiectasis, uncomplicated: Secondary | ICD-10-CM

## 2023-06-12 MED ORDER — APIXABAN 5 MG PO TABS: 5.0000 mg | ORAL_TABLET | Freq: Two times a day (BID) | ORAL | 1 refills | Status: DC

## 2023-06-12 NOTE — Telephone Encounter (Signed)
*  STAT* If patient is at the pharmacy, call can be transferred to refill team.   1. Which medications need to be refilled? (please list name of each medication and dose if known) apixaban (ELIQUIS) 5 MG TABS tablet  2. Which pharmacy/location (including street and city if local pharmacy) is medication to be sent to? [place], [place]  3. Do they need a 30 day or 90 day supply? 90 day

## 2023-06-12 NOTE — Telephone Encounter (Signed)
Prescription refill request for Eliquis received. Indication:afib Last office visit:11/24 Scr:0.84    11/24 Age: 77 Weight:49.7  kg  Prescription refilled

## 2023-06-12 NOTE — Telephone Encounter (Signed)
Pt is requesting a refill of Eliquis to go to [facility] in [place] [address]

## 2023-06-12 NOTE — Telephone Encounter (Signed)
 RX routed to [facility]

## 2023-06-18 ENCOUNTER — Ambulatory Visit: Payer: Medicare Other | Admitting: Pulmonary Disease

## 2023-06-18 DIAGNOSIS — G603 Idiopathic progressive neuropathy: Secondary | ICD-10-CM | POA: Diagnosis not present

## 2023-06-18 DIAGNOSIS — M5417 Radiculopathy, lumbosacral region: Secondary | ICD-10-CM | POA: Diagnosis not present

## 2023-06-18 DIAGNOSIS — M5412 Radiculopathy, cervical region: Secondary | ICD-10-CM | POA: Diagnosis not present

## 2023-06-18 DIAGNOSIS — G5603 Carpal tunnel syndrome, bilateral upper limbs: Secondary | ICD-10-CM | POA: Diagnosis not present

## 2023-06-19 DIAGNOSIS — H6990 Unspecified Eustachian tube disorder, unspecified ear: Secondary | ICD-10-CM | POA: Diagnosis not present

## 2023-06-19 DIAGNOSIS — R040 Epistaxis: Secondary | ICD-10-CM | POA: Diagnosis not present

## 2023-06-19 DIAGNOSIS — H6123 Impacted cerumen, bilateral: Secondary | ICD-10-CM | POA: Diagnosis not present

## 2023-06-23 DIAGNOSIS — N301 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: Secondary | ICD-10-CM | POA: Diagnosis not present

## 2023-06-23 DIAGNOSIS — E43 Unspecified severe protein-calorie malnutrition: Secondary | ICD-10-CM | POA: Diagnosis not present

## 2023-06-23 DIAGNOSIS — K219 Gastro-esophageal reflux disease without esophagitis: Secondary | ICD-10-CM | POA: Diagnosis not present

## 2023-06-23 DIAGNOSIS — I10 Essential (primary) hypertension: Secondary | ICD-10-CM | POA: Diagnosis not present

## 2023-06-23 DIAGNOSIS — G629 Polyneuropathy, unspecified: Secondary | ICD-10-CM | POA: Diagnosis not present

## 2023-06-23 DIAGNOSIS — M810 Age-related osteoporosis without current pathological fracture: Secondary | ICD-10-CM | POA: Diagnosis not present

## 2023-06-23 DIAGNOSIS — I499 Cardiac arrhythmia, unspecified: Secondary | ICD-10-CM | POA: Diagnosis not present

## 2023-06-23 DIAGNOSIS — E039 Hypothyroidism, unspecified: Secondary | ICD-10-CM | POA: Diagnosis not present

## 2023-06-23 DIAGNOSIS — R911 Solitary pulmonary nodule: Secondary | ICD-10-CM | POA: Diagnosis not present

## 2023-06-25 ENCOUNTER — Encounter: Payer: Self-pay | Admitting: Family Medicine

## 2023-06-25 ENCOUNTER — Ambulatory Visit: Payer: Medicare Other | Admitting: Family Medicine

## 2023-06-25 ENCOUNTER — Ambulatory Visit (INDEPENDENT_AMBULATORY_CARE_PROVIDER_SITE_OTHER): Payer: Medicare Other | Admitting: Family Medicine

## 2023-06-25 VITALS — BP 92/58 | HR 88 | Temp 97.9°F | Ht 66.0 in | Wt 108.4 lb

## 2023-06-25 DIAGNOSIS — G8929 Other chronic pain: Secondary | ICD-10-CM

## 2023-06-25 DIAGNOSIS — F32A Depression, unspecified: Secondary | ICD-10-CM | POA: Diagnosis not present

## 2023-06-25 DIAGNOSIS — E039 Hypothyroidism, unspecified: Secondary | ICD-10-CM

## 2023-06-25 DIAGNOSIS — H6993 Unspecified Eustachian tube disorder, bilateral: Secondary | ICD-10-CM | POA: Diagnosis not present

## 2023-06-25 DIAGNOSIS — F419 Anxiety disorder, unspecified: Secondary | ICD-10-CM | POA: Diagnosis not present

## 2023-06-25 NOTE — Assessment & Plan Note (Signed)
Slightly improved.  Pt is now seeing psych and they are prescribing her Clonazepam.  They have increased this to TID.  They also started Doxepin.  Encouraged her to continue her medication and follow up as directed.  This is the first time in quite awhile that her anxiety hasn't paralyzed her during her visit.  It is also the first visit in a long time without tears.

## 2023-06-25 NOTE — Assessment & Plan Note (Signed)
Ongoing issue for pt.  She sees pain management and is currently on Oxycodone.  She reports the medication makes her feel 'bad' and she is 'foggy' all the time.  Discussed the need to balance pain control w/ medication side effects.  Encouraged her to discuss this w/ her pain management group and see if there is any alternative.

## 2023-06-25 NOTE — Assessment & Plan Note (Signed)
Ongoing issue.  Now seems to be primarily R sided.  Sees ENT.  Encouraged her to discuss possible tube placement to improve sxs.

## 2023-06-25 NOTE — Assessment & Plan Note (Signed)
Chronic problem.  TSH done less than 1 month ago WNL.  No med changes at this time.

## 2023-06-25 NOTE — Patient Instructions (Signed)
Follow up as needed or as scheduled Ask your ENT doctor about the possibility of putting a tube in your R ear Take your medications as prescribed- no changes at this time Discuss your pain medicine with your pain management doctor and see if there are any alternatives that won't leave you so foggy It seems like things are either stable or improving- this is good news! Review the information given to you by the speech therapist Call with any questions or concerns [name] Christmas!

## 2023-06-25 NOTE — Progress Notes (Signed)
   Subjective:    Patient ID: [name], female    DOB: Dec 25, 1945, 77 y.o.   MRN: [id number]  HPI F/u- Pt reports that since she was last here, she 'made the rounds' to see her various specialists.  Went back to see back/neck surgeon who did CT scan.  He did not see any issues w/ her hardware.  Had repeat nerve conduction study and it was recommended she take either Cymbalta, Lyrica, Gabapentin- 'none of which I can take'.  She continues to see pain management and is on Oxycodone.  Pt reports that after seeing her various doctors, she is discouraged b/c there was nothing new or different offered.  Pt wants to know the next steps, 'is there a fix for any of it'   Review of Systems For ROS see HPI     Objective:   Physical Exam Vitals reviewed.  Constitutional:      General: She is not in acute distress.    Appearance: She is ill-appearing (frail).  HENT:     Head: Normocephalic and atraumatic.     Right Ear: Ear canal normal.     Left Ear: Tympanic membrane and ear canal normal.     Ears:     Comments: R TM retracted Eyes:     Extraocular Movements: Extraocular movements intact.     Conjunctiva/sclera: Conjunctivae normal.  Skin:    General: Skin is warm and dry.  Neurological:     Mental Status: She is alert and oriented to person, place, and time.  Psychiatric:        Behavior: Behavior normal.     Comments: anxious           Assessment & Plan:

## 2023-06-30 ENCOUNTER — Telehealth: Payer: Self-pay | Admitting: Physician Assistant

## 2023-06-30 NOTE — Telephone Encounter (Signed)
   The patient called the answering service after-hours today (holiday hours). She is inquiring about taking extra metoprolol if she goes into afib.  She reports that she went to outside ER around Thanksgiving (HR reported 87 by EKG) for breakthrough atrial fibrillation. She is on metoprolol 25mg  BID. The dose previously had to be lowered for concern for orthostasis. She is extremely worried she will go back into atrial fibrillation between now and her next visit in late January. She reports she went out of rhythm about 3-4 days ago, took an extra 1/2 tablet of metoprolol, and symptoms resolved. She is calling to find out if she could potentially do this again over the holidays. When asked how many times she has felt herself go into afib between Thanksgiving ER visit and now, she replied that is a very difficult question to answer and became anxious about this. She has history of severe anxiety that was evident during our phone conversation. She saw PCP last week and had SBP 92/58 though she reports her BP is normally 99-130s, and goes up whenever she is in atrial fib. She became anxious when I asked her to check her BP while we were on the phone so this was deferred. She is agreeable to check it if she goes out of rhythm again. After a lengthy discussion we discussed that if she goes back into atrial fib and feels bad and SBP <105 then we have likely exhausted options for outpatient rx and recommend she go to ER for consideration of antiarrhythmic management, but if SBP >105 and otherwise feeling OK it would be OK for her to take one extra 1/2 tablet of metoprolol and see how she feels.   Will route this msg to primary cardiologist Dr. Jens Som for review and further advisement when office reopens on Thursday. May need f/u appt moved up.  She acknowledges that her severe anxiety is affecting her in multiple ways and she is seeing a counselor for this. She also recalls in the past that if she took a  clonazepam this would help too. No SI/HI. Encouraged ongoing f/u with the provider that manages her anxiety given the mind-body connection. The patient verbalized understanding and gratitude.  Laurann Montana, PA-C

## 2023-07-02 ENCOUNTER — Ambulatory Visit: Payer: Medicare Other | Admitting: Pulmonary Disease

## 2023-07-03 ENCOUNTER — Telehealth: Payer: Self-pay | Admitting: Cardiology

## 2023-07-03 NOTE — Telephone Encounter (Signed)
Patient c/o Palpitations:  High priority if patient c/o lightheadedness, shortness of breath, or chest pain  How long have you had palpitations/irregular HR/ Afib? Are you having the symptoms now? Yes, patient states it started an hour ago   Are you currently experiencing lightheadedness, SOB or CP? No   Do you have a history of afib (atrial fibrillation) or irregular heart rhythm? Yes   Have you checked your BP or HR? (document readings if available): 142/72 HR 75  Are you experiencing any other symptoms? Anxious

## 2023-07-03 NOTE — Telephone Encounter (Signed)
Spoke with pt, Aware of dr Ludwig Clarks recommendations. Her most recent vital signs are bp 124/66/73. She does see someone for her anxiety.

## 2023-07-03 NOTE — Telephone Encounter (Signed)
Patient identification verified by 2 forms. Regina Rail, RN    Called and spoke to patient  Patient states:   -afib has been coming and going since 05/29/2023  -she is taking Metoprolol 25mg  twice a day   -would like to know if she can take half of metoprolol when she has afib episode  -afib episodes occur almost every night  -did not have afib last night   -when episodes occur she feels palpitations "feels like chest is jumping"   -concerned about taking a lot of metoprolol due to low BP   -Bp today 142/72 75 HR, 125/69 73HR   -sometimes takes anxiety Rx clonazepam and this helps decrease afib episodes  Informed patient message sent to Dr. Jens Som  Patient scheduled for OV with Dr. Flora Lipps 12/31 Reviewed ED warning signs/precautions  Patient verbalized understanding, no questions at this time

## 2023-07-05 NOTE — Progress Notes (Signed)
 Cardiology Office Note:  .   Date:  07/07/2023  ID:  [name], DOB April 07, 1946, MRN [id number] PCP: [name], MD  [facility] Providers Cardiologist:  Redell Shallow, MD Electrophysiologist:  Will Gladis Norton, MD { [name] is a 77 y.o. female with history of MV repair, pAF, non-obstructive CAD, OSA who presents for follow-up.   History of Present Illness   [name], a 77 year old with a history of mitral valve repair, paroxysmal atrial fibrillation, non-obstructive CAD, and pulmonary hypertension, presents for a follow-up visit. The patient reports experiencing palpitations and sensations similar to previous episodes of atrial fibrillation. These symptoms have been occurring for a couple of weeks and were particularly severe last week. The patient describes feeling her heart pulsing in her neck and another unspecified part of her body, particularly at night. The only relief she has found is through the use of Klonopin , which she has been advised not to take due to her concurrent use of oxycodone .  The patient also reports a history of multiple surgeries, including open heart surgery and several operations on her neck and back, resulting in significant metal hardware in her body. She has been managing chronic pain with oxycodone  for approximately eight years. She expresses a desire to discontinue the oxycodone  due to the negative side effects she experiences.  The patient lives alone in an isolated location and drives herself to her medical appointments. She reports high levels of stress, particularly related to her numerous surgeries and ongoing health issues. She has been in palliative care for an extended period and has been seeing a psychiatrist for counseling and medication management.          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 Paroxysmal Afib  3. Non-obstructive CAD  -20% RCA/LAD 2021 4. pHTN/RV failure  5.  OSA -moderate OSA with hypoxemia     ROS: All other ROS reviewed and negative. Pertinent positives noted in the HPI.     Studies Reviewed: [name]   EKG Interpretation Date/Time:  Tuesday July 07 2023 15:33:42 EST Ventricular Rate:  76 PR Interval:  160 QRS Duration:  72 QT Interval:  396 QTC Calculation: 445 R Axis:   91  Text Interpretation: Sinus rhythm with Premature supraventricular complexes Rightward axis Confirmed by Barbaraann Kotyk [phone]) on 07/07/2023 3:36:04 PM    TTE 01/27/2023  1. Left ventricular ejection fraction, by estimation, is 55 to 60%. The  left ventricle has normal function. Left ventricular endocardial border  not optimally defined to evaluate regional wall motion. There is mild left  ventricular hypertrophy. Left  ventricular diastolic function could not be evaluated. There is the  interventricular septum is flattened in systole, consistent with right  ventricular pressure overload.   2. Right ventricular systolic function is moderately reduced. The right  ventricular size is moderately enlarged. There is moderately elevated  pulmonary artery systolic pressure. The estimated right ventricular  systolic pressure is 48.9 mmHg.   3. Right atrial size was moderately dilated.   4. The mitral valve has been repaired/replaced. Trivial mitral valve  regurgitation. The mean mitral valve gradient is 2.0 mmHg with average  heart rate of 63 bpm. There is a 32 mm Sorin Memo 4D prosthetic  annuloplasty ring present in the mitral position.   Procedure Date: 01/17/20. Echo findings are consistent with normal  structure and function of the mitral valve prosthesis.   5. Tricuspid valve regurgitation is moderate.   6. The aortic  valve is tricuspid. There is mild calcification of the  aortic valve. There is mild thickening of the aortic valve. Aortic valve  regurgitation is mild. Aortic valve sclerosis is present, with no evidence  of aortic valve stenosis.   7. Pulmonic  valve regurgitation is moderate.   8. The inferior vena cava is dilated in size with <50% respiratory  variability, suggesting right atrial pressure of 15 mmHg.   Physical Exam:   VS:  BP 100/64 (BP Location: Left Arm, Patient Position: Sitting, Cuff Size: Small)   Pulse 76   Ht 5' 6 (1.676 m)   Wt 107 lb 12.8 oz (48.9 kg)   SpO2 95%   BMI 17.40 kg/m    Wt Readings from Last 3 Encounters:  07/07/23 107 lb 12.8 oz (48.9 kg)  06/25/23 108 lb 6 oz (49.2 kg)  05/26/23 109 lb 9.6 oz (49.7 kg)    GEN: Well nourished, well developed in no acute distress NECK: No JVD; No carotid bruits CARDIAC: 2/6 HSM, no murmurs, rubs, gallops RESPIRATORY:  Clear to auscultation without rales, wheezing or rhonchi  ABDOMEN: Soft, non-tender, non-distended EXTREMITIES:  No edema; No deformity  ASSESSMENT AND PLAN: .   Assessment and Plan    Paroxysmal Atrial Fibrillation Patient reports palpitations and sensation of heart racing, which she attributes to Afib. EKG shows sinus rhythm with PACs. Currently on Metoprolol  25mg  twice daily. Discussed the option of Amiodarone  for rhythm control, but patient wishes to continue current treatment and monitor symptoms. -Continue Metoprolol  25mg  twice daily. -Consider Amiodarone  for rhythm control if symptoms persist or worsen. Limited options for rhythm control given cor pulmonale.  -Follow-up appointment 08/06/2023.              Follow-up: Return if symptoms worsen or fail to improve.  Time Spent with Patient: I have spent a total of 35 minutes caring for this patient today face to face, ordering and reviewing labs/tests, reviewing prior records/medical history, examining the patient, establishing an assessment and plan, communicating results/findings to the patient/family, and documenting in the medical record.   Signed, Darryle DASEN. Barbaraann, MD, [facility]  [facility]  [address] [place], [place] [address] [phone]  4:02  PM

## 2023-07-07 ENCOUNTER — Ambulatory Visit: Payer: Medicare Other | Attending: Cardiovascular Disease | Admitting: Cardiovascular Disease

## 2023-07-07 ENCOUNTER — Encounter: Payer: Self-pay | Admitting: Cardiovascular Disease

## 2023-07-07 VITALS — BP 100/64 | HR 76 | Ht 66.0 in | Wt 107.8 lb

## 2023-07-07 DIAGNOSIS — I480 Paroxysmal atrial fibrillation: Secondary | ICD-10-CM | POA: Diagnosis not present

## 2023-07-07 DIAGNOSIS — G629 Polyneuropathy, unspecified: Secondary | ICD-10-CM | POA: Diagnosis not present

## 2023-07-07 DIAGNOSIS — I10 Essential (primary) hypertension: Secondary | ICD-10-CM | POA: Diagnosis not present

## 2023-07-07 DIAGNOSIS — K219 Gastro-esophageal reflux disease without esophagitis: Secondary | ICD-10-CM | POA: Diagnosis not present

## 2023-07-07 DIAGNOSIS — E039 Hypothyroidism, unspecified: Secondary | ICD-10-CM | POA: Diagnosis not present

## 2023-07-07 DIAGNOSIS — E43 Unspecified severe protein-calorie malnutrition: Secondary | ICD-10-CM | POA: Diagnosis not present

## 2023-07-07 DIAGNOSIS — M810 Age-related osteoporosis without current pathological fracture: Secondary | ICD-10-CM | POA: Diagnosis not present

## 2023-07-07 DIAGNOSIS — R911 Solitary pulmonary nodule: Secondary | ICD-10-CM | POA: Diagnosis not present

## 2023-07-07 DIAGNOSIS — I499 Cardiac arrhythmia, unspecified: Secondary | ICD-10-CM | POA: Diagnosis not present

## 2023-07-07 DIAGNOSIS — N301 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: Secondary | ICD-10-CM | POA: Diagnosis not present

## 2023-07-07 NOTE — Patient Instructions (Signed)
 Medication Instructions:  Your physician recommends that you continue on your current medications as directed. Please refer to the Current Medication list given to you today.    *If you need a refill on your cardiac medications before your next appointment, please call your pharmacy*   Lab Work: None    If you have labs (blood work) drawn today and your tests are completely normal, you will receive your results only by: MyChart Message (if you have MyChart) OR A paper copy in the mail If you have any lab test that is abnormal or we need to change your treatment, we will call you to review the results.   Testing/Procedures: None    Follow-Up: At [facility], you and your health needs are our priority.  As part of our continuing mission to provide you with exceptional heart care, we have created designated Provider Care Teams.  These Care Teams include your primary Cardiologist (physician) and Advanced Practice Providers (APPs -  Physician Assistants and Nurse Practitioners) who all work together to provide you with the care you need, when you need it.  We recommend signing up for the patient portal called MyChart.  Sign up information is provided on this After Visit Summary.  MyChart is used to connect with patients for Virtual Visits (Telemedicine).  Patients are able to view lab/test results, encounter notes, upcoming appointments, etc.  Non-urgent messages can be sent to your provider as well.   To learn more about what you can do with MyChart, go to [email or web address].    Your next appointment:   Follow up as needed   Provider:   Darryle Decent, MD    Other Instructions Amiodarone  would be Dr. Dineen next medication recommendation

## 2023-07-09 ENCOUNTER — Telehealth: Payer: Self-pay | Admitting: Cardiology

## 2023-07-09 NOTE — Telephone Encounter (Signed)
 Called patient with no answer. Left message to call back

## 2023-07-09 NOTE — Telephone Encounter (Signed)
 Pt c/o medication issue:  1. Name of Medication: Amiodarone    2. How are you currently taking this medication (dosage and times per day)? Not taking this medication yet   3. Are you having a reaction (difficulty breathing--STAT)?   4. What is your medication issue? Patient is requesting call back to discuss medication that was recommended to her at her last office visit.

## 2023-07-09 NOTE — Telephone Encounter (Signed)
 Pt is returning call to nurse.

## 2023-07-09 NOTE — Telephone Encounter (Signed)
Called patient with no answer. Left VM to return call.

## 2023-07-09 NOTE — Telephone Encounter (Signed)
 Called and spoke to patient. Verified name and DOB. Patient is calling to inform Dr MALVA' neal that when she left the office from seeing him on 12/31 and got home her BP was elevated at 150/78. She also wants to see if she can hold off on taking the Amiodarone . She has concerns about the side effect she read that the medication can have. She reports that she has thyroid  and lung condition. She does not want to start the medication and asked if it is okay if she waits until her appointment to discuss. Please advise.

## 2023-07-10 NOTE — Telephone Encounter (Signed)
 Called and spoke to patient. Verified name and DOB.  Below message relayed.  Patient verbalized understanding and agree. Patient stated she will discuss with Dr Pietro at her visit.    Would recommend she monitor her BP at home for now. No change to medications. Discuss further with Dr. Pietro who knows her best.  Darryle T. Barbaraann, MD, FACC

## 2023-07-13 ENCOUNTER — Ambulatory Visit: Payer: Medicare Other | Admitting: Family Medicine

## 2023-07-22 ENCOUNTER — Telehealth: Payer: Self-pay | Admitting: Cardiology

## 2023-07-22 DIAGNOSIS — R040 Epistaxis: Secondary | ICD-10-CM | POA: Diagnosis not present

## 2023-07-22 NOTE — Telephone Encounter (Signed)
 Pt is calling to f/u on getting a callback to be advised on what she should do next since she's supposed to take the next dose tonight at 8pm. Please advise

## 2023-07-22 NOTE — Telephone Encounter (Signed)
 Pt c/o medication issue:  1. Name of Medication: apixaban  (ELIQUIS ) 5 MG TABS tablet   2. How are you currently taking this medication (dosage and times per day)? As written  3. Are you having a reaction (difficulty breathing--STAT)? No   4. What is your medication issue? Pt had a nose bleed this morning and wants to know if she should take her Eliquis  this morning. Please advise

## 2023-07-22 NOTE — Telephone Encounter (Signed)
 Called and spoke to patient. Below message relayed Per Dr Audery Blazing. Patient states she was seen by Dr Audery Blazing at ENT and he curarized her. She deny bleeding at this time. She would like to know if she still needs to hold the Eliquis ?    Hold apixaban  for 3 days and then resume if nose bleed stops [name]

## 2023-07-22 NOTE — Telephone Encounter (Signed)
 Called and spoke to patient. Verified name and DOB. Patient report that she had a nose bleed that started around 4:30 am this morning and lasted about 30 minutes. She packed it with some packing gel she bought OTC. She has not removed the packing since inserting. Patient stated she was afraid to remove. She also report she is having some nausea and dizziness. She deny blood in her urine and stool, abdominal pain, CP or any other symptoms at this time. She is going to call ENT and see if they can see her today. Patient would like to know if she should hold her Eliquis  for now.?  BP readings:  1/15 152/88 HR 64 while on the phone with her She could not provide any other readings.

## 2023-07-23 ENCOUNTER — Other Ambulatory Visit: Payer: Self-pay | Admitting: Family Medicine

## 2023-07-23 DIAGNOSIS — E785 Hyperlipidemia, unspecified: Secondary | ICD-10-CM

## 2023-07-23 NOTE — Telephone Encounter (Signed)
Spoke with pt, Aware of dr Ludwig Clarks recommendations. [name] is reporting dizziness and nausea. [name] reports the dizziness is there most of the time and gets better when lying down or sitting still. Her blood pressure is running from 140-100. [name] is going to see her medical doctor tomorrow for this issue. The ENT clears her ears and so they have not said anything about fluid in her ears. [name] was given claritin and zyrtec to try to help with the dizziness.

## 2023-07-24 ENCOUNTER — Encounter: Payer: Self-pay | Admitting: Family Medicine

## 2023-07-24 ENCOUNTER — Ambulatory Visit: Payer: Medicare Other | Admitting: Family Medicine

## 2023-07-24 VITALS — BP 128/62 | HR 78 | Temp 97.8°F | Ht 66.0 in | Wt 107.5 lb

## 2023-07-24 DIAGNOSIS — R110 Nausea: Secondary | ICD-10-CM | POA: Diagnosis not present

## 2023-07-24 DIAGNOSIS — R42 Dizziness and giddiness: Secondary | ICD-10-CM

## 2023-07-24 DIAGNOSIS — E039 Hypothyroidism, unspecified: Secondary | ICD-10-CM

## 2023-07-24 MED ORDER — ONDANSETRON HCL 8 MG PO TABS: 8.0000 mg | ORAL_TABLET | Freq: Three times a day (TID) | ORAL | 1 refills | Status: AC | PRN

## 2023-07-24 NOTE — Progress Notes (Signed)
   Subjective:    Patient ID: [name], female    DOB: [birth year], 78 y.o.   MRN: [id number]  HPI Nausea- pt reports daily nausea.  'i can barely function'.  Also feeling 'real dizzy'.  Had similar sxs in August.  Pt reports sxs have worsened in the last month.  No longer having relief w/ Zofran or meclizine.  Reports she is forcing herself to eat- an egg daily, grilled cheese, soup, frozen meals.  States she is eating regularly throughout the day.  Pt has seen Neurology and ENT but not for dizziness evaluation.     Review of Systems For ROS see HPI     Objective:   Physical Exam Vitals reviewed.  Constitutional:      General: She is not in acute distress.    Appearance: She is ill-appearing (frail, elderly woman).  HENT:     Head: Normocephalic and atraumatic.     Right Ear: Tympanic membrane and ear canal normal.     Left Ear: Tympanic membrane and ear canal normal.     Nose: Rhinorrhea present.  Eyes:     Extraocular Movements: Extraocular movements intact.     Conjunctiva/sclera: Conjunctivae normal.     Pupils: Pupils are equal, round, and reactive to light.  Cardiovascular:     Rate and Rhythm: Normal rate and regular rhythm.  Pulmonary:     Effort: Pulmonary effort is normal. No respiratory distress.     Breath sounds: No wheezing or rhonchi.  Abdominal:     General: There is no distension.     Palpations: Abdomen is soft.     Tenderness: There is no abdominal tenderness. There is no guarding or rebound.  Musculoskeletal:     Right lower leg: No edema.     Left lower leg: No edema.  Skin:    General: Skin is warm and dry.  Neurological:     Mental Status: She is alert and oriented to person, place, and time.  Psychiatric:     Comments: Anxious, tearful           Assessment & Plan:  Nausea- deteriorated.  Pt has struggled w/ nausea in the past but reports sxs are now constant and not responding to Zofran.  Will check labs to r/o metabolic causes.  Discussed  that her medication may be the cause- particularly her pain medication.  Encouraged her to discuss alternatives w/ pain management.  Dizziness- ongoing issue for pt but again she feels this is worse.  No longer responding to Meclizine.  She has seen both ENT and neuro but she states she did not mention the dizziness.  Will refer to Neuro for a complete evaluation.  Hypothyroid- pt is concerned that whenever she doesn't feel well that her thyroid levels are 'off'.  Check labs.  Adjust meds prn

## 2023-07-24 NOTE — Patient Instructions (Signed)
Follow up as needed or as scheduled We'll notify you of your lab results and make any changes if needed Use the higher dose of Zofran to help w/ nausea Ask your pain management doctor if there is an alternative w/ less side effects or if there is a better way to treat the nausea Make sure you are eating regularly to help w/ nausea and dizziness We'll call you to schedule your neurology appt to discuss your worsening dizziness Call with any questions or concerns Hang in there!

## 2023-07-25 ENCOUNTER — Other Ambulatory Visit: Payer: Self-pay | Admitting: Adult Health

## 2023-07-25 ENCOUNTER — Telehealth: Payer: Self-pay | Admitting: Physician Assistant

## 2023-07-25 LAB — BASIC METABOLIC PANEL
BUN: 22 mg/dL (ref 7–25)
CO2: 29 mmol/L (ref 20–32)
Calcium: 9.3 mg/dL (ref 8.6–10.4)
Chloride: 104 mmol/L (ref 98–110)
Creat: 0.79 mg/dL (ref 0.60–1.00)
Glucose, Bld: 97 mg/dL (ref 65–99)
Potassium: 6.1 mmol/L — ABNORMAL HIGH (ref 3.5–5.3)
Sodium: 146 mmol/L (ref 135–146)

## 2023-07-25 LAB — CBC WITH DIFFERENTIAL/PLATELET
Absolute Lymphocytes: 1606 {cells}/uL (ref 850–3900)
Absolute Monocytes: 539 {cells}/uL (ref 200–950)
Basophils Absolute: 19 {cells}/uL (ref 0–200)
Basophils Relative: 0.3 %
Eosinophils Absolute: 248 {cells}/uL (ref 15–500)
Eosinophils Relative: 4 %
HCT: 39.5 % (ref 35.0–45.0)
Hemoglobin: 13 g/dL (ref 11.7–15.5)
MCH: 29.5 pg (ref 27.0–33.0)
MCHC: 32.9 g/dL (ref 32.0–36.0)
MCV: 89.6 fL (ref 80.0–100.0)
MPV: 10.6 fL (ref 7.5–12.5)
Monocytes Relative: 8.7 %
Neutro Abs: 3788 {cells}/uL (ref 1500–7800)
Neutrophils Relative %: 61.1 %
Platelets: 172 10*3/uL (ref 140–400)
RBC: 4.41 10*6/uL (ref 3.80–5.10)
RDW: 12.7 % (ref 11.0–15.0)
Total Lymphocyte: 25.9 %
WBC: 6.2 10*3/uL (ref 3.8–10.8)

## 2023-07-25 LAB — HEPATIC FUNCTION PANEL
AG Ratio: 1.2 (calc) (ref 1.0–2.5)
ALT: 17 U/L (ref 6–29)
AST: 27 U/L (ref 10–35)
Albumin: 4.1 g/dL (ref 3.6–5.1)
Alkaline phosphatase (APISO): 83 U/L (ref 37–153)
Bilirubin, Direct: 0.2 mg/dL (ref 0.0–0.2)
Globulin: 3.3 g/dL (ref 1.9–3.7)
Indirect Bilirubin: 0.5 mg/dL (ref 0.2–1.2)
Total Bilirubin: 0.7 mg/dL (ref 0.2–1.2)
Total Protein: 7.4 g/dL (ref 6.1–8.1)

## 2023-07-25 LAB — LIPASE: Lipase: 73 U/L — ABNORMAL HIGH (ref 7–60)

## 2023-07-25 LAB — TSH: TSH: 0.55 m[IU]/L (ref 0.40–4.50)

## 2023-07-25 NOTE — Telephone Encounter (Addendum)
Patient called because she had a nosebleed.  She went to the drugstore and got something called bleedstop.  It was still in her nose, she said she had called the company and got one of the owners who explained to her how to use it and was very helpful.  She wanted to know if she could stop the Eliquis.  She was previously on Xarelto, but then she was changed to Eliquis.  I got her to check the bleedstop and call me back.  She stated the bleeding had stopped and the medications seem to have worked.  I contacted Dr. Jens Som who said she could hold the Eliquis for a week, the patient considered that good news.  I emphasized that if she was put back on the Eliquis, she needed to take it because of the need to prevent a stroke.  She is in agreement with this plan of care.  Theodore Demark, PA-C 07/25/2023 4:56 PM

## 2023-07-27 ENCOUNTER — Encounter: Payer: Self-pay | Admitting: Family Medicine

## 2023-07-27 ENCOUNTER — Telehealth: Payer: Self-pay

## 2023-07-27 ENCOUNTER — Other Ambulatory Visit: Payer: Self-pay

## 2023-07-27 DIAGNOSIS — G894 Chronic pain syndrome: Secondary | ICD-10-CM | POA: Diagnosis not present

## 2023-07-27 DIAGNOSIS — R748 Abnormal levels of other serum enzymes: Secondary | ICD-10-CM

## 2023-07-27 DIAGNOSIS — Z79891 Long term (current) use of opiate analgesic: Secondary | ICD-10-CM | POA: Diagnosis not present

## 2023-07-27 DIAGNOSIS — M4693 Unspecified inflammatory spondylopathy, cervicothoracic region: Secondary | ICD-10-CM | POA: Diagnosis not present

## 2023-07-27 DIAGNOSIS — E875 Hyperkalemia: Secondary | ICD-10-CM

## 2023-07-27 DIAGNOSIS — M961 Postlaminectomy syndrome, not elsewhere classified: Secondary | ICD-10-CM | POA: Diagnosis not present

## 2023-07-27 DIAGNOSIS — M5412 Radiculopathy, cervical region: Secondary | ICD-10-CM | POA: Diagnosis not present

## 2023-07-27 NOTE — Telephone Encounter (Signed)
-----   Message from Neena Rhymes sent at 07/27/2023  7:45 AM EST ----- Your potassium and lipase are both elevated.  HOLD any potassium supplements that you may be taking and we need to repeat your level at a lab only visit early this week (BMP, dx hyperkalemia).  We will also repeat your Lipase at the same lab visit.  (Lipase, dx elevated lipase)  It would be best if you were fasting for these labs so we can get an accurate reading

## 2023-07-27 NOTE — Telephone Encounter (Signed)
Copied from CRM [id number]. Topic: Clinical - Request for Lab/Test Order >> Jul 27, 2023  9:46 AM Alona Bene A wrote: Reason for CRM: Patient called in requesting to speak with nurse regarding lab results. Patient was advised of results but has additional questions about fasting and other medication that she is currently taking. Please contact patient at [phone].

## 2023-07-28 ENCOUNTER — Encounter: Payer: Self-pay | Admitting: Family Medicine

## 2023-07-28 ENCOUNTER — Other Ambulatory Visit: Payer: Medicare Other

## 2023-07-28 ENCOUNTER — Other Ambulatory Visit (INDEPENDENT_AMBULATORY_CARE_PROVIDER_SITE_OTHER): Payer: Medicare Other

## 2023-07-28 DIAGNOSIS — E875 Hyperkalemia: Secondary | ICD-10-CM

## 2023-07-28 DIAGNOSIS — R748 Abnormal levels of other serum enzymes: Secondary | ICD-10-CM | POA: Diagnosis not present

## 2023-07-28 LAB — BASIC METABOLIC PANEL
BUN: 20 mg/dL (ref 6–23)
CO2: 35 meq/L — ABNORMAL HIGH (ref 19–32)
Calcium: 9.1 mg/dL (ref 8.4–10.5)
Chloride: 100 meq/L (ref 96–112)
Creatinine, Ser: 0.65 mg/dL (ref 0.40–1.20)
GFR: 84 mL/min (ref >60.00)
Glucose, Bld: 70 mg/dL (ref 70–99)
Potassium: 4.7 meq/L (ref 3.5–5.1)
Sodium: 142 meq/L (ref 135–145)

## 2023-07-28 LAB — LIPASE: Lipase: 47 U/L (ref 11.0–59.0)

## 2023-07-29 ENCOUNTER — Telehealth: Payer: Self-pay | Admitting: Family Medicine

## 2023-07-29 NOTE — Telephone Encounter (Signed)
Patient would like a call back to see when she needs to start back taking her medication she would like a call back regarding this

## 2023-08-03 DIAGNOSIS — R42 Dizziness and giddiness: Secondary | ICD-10-CM | POA: Diagnosis not present

## 2023-08-03 DIAGNOSIS — M546 Pain in thoracic spine: Secondary | ICD-10-CM | POA: Diagnosis not present

## 2023-08-03 DIAGNOSIS — M542 Cervicalgia: Secondary | ICD-10-CM | POA: Diagnosis not present

## 2023-08-05 ENCOUNTER — Other Ambulatory Visit: Payer: Self-pay | Admitting: Family Medicine

## 2023-08-05 ENCOUNTER — Telehealth: Payer: Self-pay | Admitting: Pulmonary Disease

## 2023-08-05 ENCOUNTER — Telehealth: Payer: Self-pay | Admitting: Family Medicine

## 2023-08-05 ENCOUNTER — Ambulatory Visit: Payer: Self-pay | Admitting: Family Medicine

## 2023-08-05 ENCOUNTER — Other Ambulatory Visit: Payer: Self-pay | Admitting: Pulmonary Disease

## 2023-08-05 DIAGNOSIS — E785 Hyperlipidemia, unspecified: Secondary | ICD-10-CM

## 2023-08-05 NOTE — Telephone Encounter (Signed)
Patient needs refill of albuterol. She has misplaced what she had.  Pharmacy:[facility] on [address] in [place]

## 2023-08-05 NOTE — Telephone Encounter (Signed)
Copied from CRM [id number]. Topic: Clinical - Prescription Issue >> Aug 05, 2023  4:25 PM Irine Seal wrote: Reason for CRM:  levothyroxine (SYNTHROID) 75 MCG tablet patient misplaced her medication, stated the pharmacy told her to contact her PCP because she had them refilled last week and it is too early, patient stated she is aware that it will cost her out of pocket since insurance wont cover early refills.

## 2023-08-05 NOTE — Telephone Encounter (Signed)
Copied from CRM [id number]. Topic: Clinical - Red Word Triage >> Aug 05, 2023  4:28 PM Irine Seal wrote: [facility] that prompted transfer to Nurse Triage: itching, burning and stinging pain left side of abdomen, she stated she had previously had shingles, and it feels exactly like it did last time. Patient is very uncomfortable and in pain.   Chief Complaint: Possible shingles  Symptoms: Pain and itchiness to skin on the left sided abdomen Frequency: Intermittent Pertinent Negatives: Patient denies fevers Disposition: [] ED /[] Urgent Care (no appt availability in office) / [x] Appointment(In office/virtual)/ []  [facility]/ [] Home Care/ [] Refused Recommended Disposition /[] [facility]/ []  Follow-up with PCP Additional Notes: Patient reports that 2-3 days ago she began to experience itchiness and pain to the skin on her left sided abdomen. She states that these symptoms are intermittent, describing her pain as a "burning/stinging" pain. She denies any fevers. Patient states that current symptoms are similar to previous episodes of shingles and would like to be evaluated. Appointment made for patient which she is agreeable with.     Reason for Disposition  [1] Shingles rash AND [2] onset > 72 hours ago (3 days)  Answer Assessment - Initial Assessment Questions 1. APPEARANCE of RASH: "Describe the rash."      Unable to see 2. LOCATION: "Where is the rash located?"      Left sided abdomen  3. ONSET: "When did the rash start?"      2-3 days ago 4. ITCHING: "Does the rash itch?" If Yes, ask: "How bad is the itch?"  (Scale 1-10; or mild, moderate, severe)     Mild to moderate  5. PAIN: "Does the rash hurt?" If Yes, ask: "How bad is the pain?"  (Scale 0-10; or none, mild, moderate, severe)    - NONE (0): no pain    - MILD (1-3): doesn't interfere with normal activities     - MODERATE (4-7): interferes with normal activities or awakens from sleep     - SEVERE (8-10): excruciating  pain, unable to do any normal activities     8/10 burning/stinging pain 6. OTHER SYMPTOMS: "Do you have any other symptoms?" (e.g., fever)     No 7. PREGNANCY: "Is there any chance you are pregnant?" "When was your last menstrual period?"     No  Protocols used: Shingles (Zoster)-A-AH

## 2023-08-05 NOTE — Progress Notes (Unsigned)
Cardiology Office Note:  .   Date:  08/06/2023  ID:  [name], First [birth year], MRN [id number] PCP: Sheliah Hatch, MD  [facility] Providers Cardiologist:  Olga Millers, MD Electrophysiologist:  Will Jorja Loa, MD   History of Present Illness: .   [name] is a 78 y.o. female with a past medical history of chronic dyspnea, PAF, previous MV repair, coronary artery disease, HTN. Patient is followed by Dr. Jens Som and presents today for follow up.   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 She underwent TEE on 11/21/19 that showed severe MR with a flail segment of the anterior leaflet. It was recommended that she undergo mitral valve repair. Prior to repair, patient underwent R/L heart cath on 12/02/19 that showed mild, nonobstructive CAD with 20% stenosis in the proximal RCA and distal LAD. She was seen by Dr. Barry Dienes and underwent minimally invasive mitral valve repair, complete MAZE procedure, and sternotomy with repair of left ventricular free wall laceration on 01/17/20. Postoperatively, patient developed acute blood loss anemia requiring blood transfusions. Also developed atrial fibrillation, started on amiodarone and lopressor. Later, amiodarone was discontinued and afib has seen been managed with eliquis and metoprolol.   Patient also has a history of chronic dyspnea. Echocardiogram from 02/2022 showed EF 50-55%, moderately reduced RV systolic function. There was interventricular septal flattening in systole consistent with RV pressure overload. Findings were consistent with normal structure and function of mitral valve prosthesis. Follow up chest CT in 02/2022 showed revealed right middle lobe and left upper lobe nodularity suggestive of atypical infection, nodule in the left major fissure. She is followed by Dr. Francine Graven with pulmonology.   When seen by cardiology in 10/2022, patient reported frequent  nosebleeds on eliquis. Recommended she consider watchman, but she refused. She also reported pain in her calves and ankles. Underwent ABIs in 11/2022 that were normal. Later, echocardiogram from 01/2023 showed EF 55-60%, mild LVH. Her ventricular septum was flattened in systole, consistent with RV pressure overload. RV function moderately reduced, moderately elevated PA systolic pressure. Mitral valve repair/replaced, trivial mitral valve regurgitation, mean gradient 2.0 mmHg with an average heart rate of 60 bpm.   Patient was last seen by cardiology on 07/07/23. AT that time, patient reported feelings of palpitations and elevated HR. Discussed restaring amiodarone, but patient preferred to continue her current treatment and monitor her symptoms. Remained on metoprolol tartrate 25 mg BID, eliquis 5 mg BID.  Today, patient presents for a follow up appointment. Reports having episodes of nausea and dizziness which has been ongoing for the past 4-6 weeks. The patient reports that Zofran has been increased to three tablets a day, which she takes as needed. She reports chronic shortness of breath, which she attributes to the hardware from previous surgeries. She uses a nebulizer, which helps at times. The shortness of breath has reportedly worsened over the past three years, particularly when walking or exerting herself. Also reports difficulty swallowing, for which they have undergone tests. They report a history of apnea but have declined a CPAP machine due to claustrophobia.  Glenda reports having experienced 1 episode of chest pain that lasted a full day approximately a week to a week and a half ago. The pain was present upon waking and persisted throughout the day, but has not recurred since. Denies any aggravating or alleviating factors. She denies chest pain on exertion. She remains on eliquis, and has had 2 nosebleeds recently  ROS: Per HPI   Studies Reviewed: .   Cardiac Studies & Procedures   CARDIAC  CATHETERIZATION  CARDIAC CATHETERIZATION 12/02/2019  Narrative  Prox RCA lesion is 20% stenosed.  Dist LAD lesion is 20% stenosed.  1. Mild non-obstructive CAD  Recommendation: Continue planning for mitral valve repair.  Findings Coronary Findings Diagnostic  Dominance: Right  Left Anterior Descending Vessel is large. Dist LAD lesion is 20% stenosed.  Left Circumflex Vessel is small.  Right Coronary Artery Vessel is large. Prox RCA lesion is 20% stenosed.  Intervention  No interventions have been documented.    ECHOCARDIOGRAM  ECHOCARDIOGRAM COMPLETE 01/27/2023  Narrative ECHOCARDIOGRAM REPORT    Patient Name:   [name] Date of Exam: 01/27/2023 Medical Rec #:  [id number]   Height:       66.0 in Accession #:    [id number]  Weight:       114.2 lb Date of Birth:  16-Jul-1945   BSA:          1.576 m Patient Age:    68 years    BP:           100/60 mmHg Patient Gender: F           HR:           65 bpm. Exam Location:  Outpatient  Procedure: 2D Echo, Cardiac Doppler and Color Doppler  Indications:    S/P Mitral Valve repair Z98.89  History:        Patient has prior history of Echocardiogram examinations, most recent 02/12/2022. Arrythmias:Atrial Fibrillation; Signs/Symptoms:Shortness of Breath.  Mitral Valve: 32 mm Sorin Memo 4D prosthetic annuloplasty ring valve is present in the mitral position. Procedure Date: 01/17/20.  Sonographer:    Eulah Pont RDCS Referring Phys: [id number] Jonita Albee   Sonographer Comments: Image acquisition challenging due to patient body habitus. IMPRESSIONS   1. Left ventricular ejection fraction, by estimation, is 55 to 60%. The left ventricle has normal function. Left ventricular endocardial border not optimally defined to evaluate regional wall motion. There is mild left ventricular hypertrophy. Left ventricular diastolic function could not be evaluated. There is the interventricular septum is flattened in systole,  consistent with right ventricular pressure overload. 2. Right ventricular systolic function is moderately reduced. The right ventricular size is moderately enlarged. There is moderately elevated pulmonary artery systolic pressure. The estimated right ventricular systolic pressure is 48.9 mmHg. 3. Right atrial size was moderately dilated. 4. The mitral valve has been repaired/replaced. Trivial mitral valve regurgitation. The mean mitral valve gradient is 2.0 mmHg with average heart rate of 63 bpm. There is a 32 mm Sorin Memo 4D prosthetic annuloplasty ring present in the mitral position. Procedure Date: 01/17/20. Echo findings are consistent with normal structure and function of the mitral valve prosthesis. 5. Tricuspid valve regurgitation is moderate. 6. The aortic valve is tricuspid. There is mild calcification of the aortic valve. There is mild thickening of the aortic valve. Aortic valve regurgitation is mild. Aortic valve sclerosis is present, with no evidence of aortic valve stenosis. 7. Pulmonic valve regurgitation is moderate. 8. The inferior vena cava is dilated in size with <50% respiratory variability, suggesting right atrial pressure of 15 mmHg.  FINDINGS Left Ventricle: Left ventricular ejection fraction, by estimation, is 55 to 60%. The left ventricle has normal function. Left ventricular endocardial border not optimally defined to evaluate regional wall motion. The left ventricular internal cavity size was normal in size. There is mild left ventricular hypertrophy. The  interventricular septum is flattened in systole, consistent with right ventricular pressure overload. Left ventricular diastolic function could not be evaluated.  Right Ventricle: The right ventricular size is moderately enlarged. No increase in right ventricular wall thickness. Right ventricular systolic function is moderately reduced. There is moderately elevated pulmonary artery systolic pressure. The  tricuspid regurgitant velocity is 2.91 m/s, and with an assumed right atrial pressure of 15 mmHg, the estimated right ventricular systolic pressure is 48.9 mmHg.  Left Atrium: Left atrial size was normal in size.  Right Atrium: Right atrial size was moderately dilated.  Pericardium: There is no evidence of pericardial effusion.  Mitral Valve: The mitral valve has been repaired/replaced. Trivial mitral valve regurgitation. There is a 32 mm Sorin Memo 4D prosthetic annuloplasty ring present in the mitral position. Procedure Date: 01/17/20. Echo findings are consistent with normal structure and function of the mitral valve prosthesis. MV peak gradient, 8.8 mmHg. The mean mitral valve gradient is 2.0 mmHg with average heart rate of 63 bpm.  Tricuspid Valve: The tricuspid valve is normal in structure. Tricuspid valve regurgitation is moderate.  Aortic Valve: The aortic valve is tricuspid. There is mild calcification of the aortic valve. There is mild thickening of the aortic valve. Aortic valve regurgitation is mild. Aortic valve sclerosis is present, with no evidence of aortic valve stenosis.  Pulmonic Valve: The pulmonic valve was normal in structure. Pulmonic valve regurgitation is moderate.  Aorta: The aortic root is normal in size and structure.  Venous: The inferior vena cava is dilated in size with less than 50% respiratory variability, suggesting right atrial pressure of 15 mmHg.  IAS/Shunts: The atrial septum is grossly normal.   LEFT VENTRICLE PLAX 2D LVIDd:         2.90 cm LVIDs:         2.10 cm LV PW:         1.10 cm LV IVS:        1.00 cm LVOT diam:     1.90 cm LV SV:         42 LV SV Index:   27 LVOT Area:     2.84 cm  LV Volumes (MOD) LV vol d, MOD A2C: 59.7 ml LV vol d, MOD A4C: 44.9 ml LV vol s, MOD A2C: 23.1 ml LV vol s, MOD A4C: 14.9 ml LV SV MOD A2C:     36.6 ml LV SV MOD A4C:     44.9 ml LV SV MOD BP:      33.8 ml  RIGHT VENTRICLE RV S prime:     8.99  cm/s TAPSE (M-mode): 1.5 cm  LEFT ATRIUM             Index        RIGHT ATRIUM           Index LA diam:        3.10 cm 1.97 cm/m   RA Area:     20.30 cm LA Vol (A2C):   36.9 ml 23.41 ml/m  RA Volume:   68.10 ml  43.21 ml/m LA Vol (A4C):   40.9 ml 25.95 ml/m LA Biplane Vol: 40.0 ml 25.38 ml/m AORTIC VALVE                   PULMONIC VALVE LVOT Vmax:         70.90 cm/s  PR End Diast Vel: 6.15 msec LVOT Vmean:        43.400 cm/s LVOT VTI:  0.149 m AR Vena Contracta: 0.30 cm  AORTA Ao Root diam: 3.00 cm Ao Asc diam:  3.00 cm  MITRAL VALVE              TRICUSPID VALVE MV Area (PHT): 2.89 cm   TR Peak grad:   33.9 mmHg MV Area VTI:   1.19 cm   TR Vmax:        291.00 cm/s MV Peak grad:  8.8 mmHg MV Mean grad:  2.0 mmHg   SHUNTS MV Vmax:       1.48 m/s   Systemic VTI:  0.15 m MV Vmean:      62.3 cm/s  Systemic Diam: 1.90 cm  Weston Brass MD Electronically signed by Weston Brass MD Signature Date/Time: 01/28/2023/9:53:11 PM    Final  TEE  ECHO TEE 01/26/2020  Narrative TRANSESOPHOGEAL ECHO REPORT    Patient Name:   CIMONE FAHEY Date of Exam: 01/26/2020 Medical Rec #:  [id number]   Height:       66.0 in Accession #:    [id number]  Weight:       136.7 lb Date of Birth:  11-20-1945   BSA:          1.701 m Patient Age:    74 years    BP:           118/66 mmHg Patient Gender: F           HR:           123 bpm. Exam Location:  Inpatient  Procedure: TEE-Intraopertive and 3D Echo  Indications:     atrial fibrillation  History:         Patient has prior history of Echocardiogram examinations, most recent 11/21/2019. Arrythmias:Atrial Fibrillation.  Mitral Valve: 32 mm prosthetic annuloplasty ring valve is present in the mitral position. Procedure Date: 01/17/2020.  Sonographer:     Delcie Roch Referring Phys:  [id number] Graciella Freer Diagnosing Phys: Lennie Odor MD  PROCEDURE: After discussion of the risks and benefits of a TEE, an informed  consent was obtained from the patient. TEE procedure time was 20 minutes. The transesophogeal probe was passed without difficulty through the esophogus of the patient. Imaged were obtained with the patient in a left lateral decubitus position. Local oropharyngeal anesthetic was provided with Cetacaine. Sedation performed by different physician. The patient was monitored while under deep sedation. Anesthestetic sedation was provided intravenously by Anesthesiology: 270mg  of Propofol. Image quality was excellent. The patient's vital signs; including heart rate, blood pressure, and oxygen saturation; remained stable throughout the procedure. The patient developed no complications during the procedure. A successful direct current cardioversion was performed at 200 joules with 1 attempt.  IMPRESSIONS   1. Left ventricular ejection fraction, by estimation, is 55 to 60%. The left ventricle has normal function. The left ventricle has no regional wall motion abnormalities. 2. Right ventricular systolic function is normal. The right ventricular size is normal. 3. S/p surgical LAA clipping. No residual connection noted with the LA. No LA thrombus. No left atrial/left atrial appendage thrombus was detected. 4. 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 No residual MR. MVA by direct 3D MPR assessment 2.6 cm2. The mitral valve has been repaired/replaced. No evidence of mitral valve regurgitation. No evidence of mitral stenosis. The mean mitral valve gradient is 4.0 mmHg with average heart rate of 111 bpm. There is a 32 mm prosthetic annuloplasty ring present in the mitral position. Procedure Date: 01/17/2020. 5. The tricuspid  valve is myxomatous. 6. The aortic valve is tricuspid. Aortic valve regurgitation is not visualized. No aortic stenosis is present.  Conclusion(s)/Recommendation(s): No LA/LAA thrombus identified. Successful cardioversion performed with restoration of normal sinus rhythm.  FINDINGS Left  Ventricle: Left ventricular ejection fraction, by estimation, is 55 to 60%. The left ventricle has normal function. The left ventricle has no regional wall motion abnormalities. The left ventricular internal cavity size was normal in size. There is no left ventricular hypertrophy.  Right Ventricle: The right ventricular size is normal. No increase in right ventricular wall thickness. Right ventricular systolic function is normal.  Left Atrium: S/p surgical LAA clipping. No residual connection noted with the LA. No LA thrombus. Left atrial size was normal in size. No left atrial/left atrial appendage thrombus was detected.  Right Atrium: Right atrial size was normal in size.  Pericardium: Trivial pericardial effusion is present.  Mitral Valve: S/p MV repair with 32 mm annuloplasty ring. No residual MR. MVA by direct 3D MPR assessment 2.6 cm2. The mitral valve has been repaired/replaced. No evidence of mitral valve regurgitation. There is a 32 mm prosthetic annuloplasty ring present in the mitral position. Procedure Date: 01/17/2020. No evidence of mitral valve stenosis. The mean mitral valve gradient is 4.0 mmHg with average heart rate of 111 bpm.  Tricuspid Valve: The tricuspid valve is myxomatous. Tricuspid valve regurgitation is mild . No evidence of tricuspid stenosis.  Aortic Valve: The aortic valve is tricuspid. Aortic valve regurgitation is not visualized. No aortic stenosis is present.  Pulmonic Valve: The pulmonic valve was grossly normal. Pulmonic valve regurgitation is mild. No evidence of pulmonic stenosis.  Aorta: The aortic root, ascending aorta, aortic arch and descending aorta are all structurally normal, with no evidence of dilitation or obstruction.  Venous: The left upper pulmonary vein, left lower pulmonary vein and right upper pulmonary vein are normal.  IAS/Shunts: There is redundancy of the interatrial septum. No atrial level shunt detected by color flow  Doppler.  Additional Comments: There is a small pleural effusion in the left lateral region.    AORTA Ao Root diam: 3.20 cm Ao Asc diam:  2.80 cm  MITRAL VALVE           TRICUSPID VALVE MV Mean grad: 4.0 mmHg TR Peak grad:   25.0 mmHg TR Vmax:        250.00 cm/s  Lennie Odor MD Electronically signed by Lennie Odor MD Signature Date/Time: 01/26/2020/11:22:07 AM    Final  MONITORS  LONG TERM MONITOR (3-14 DAYS) 06/06/2019  Narrative Max 226 bpm 06:22pm, 11/15 Min 54 bpm 12:59am, 11/15 Avg 76 bpm Sinus rhythm with occasional runs of SVT, longest 15 seconds at 123 bmp Rare PVCs, 2.2% PACs Zero atrial fibrillation noted Patient trigger associated with sinus rhythm, rate 87  Will Camnitz, MD           Risk Assessment/Calculations:             Physical Exam:   VS:  BP (!) 108/58 (BP Location: Left Arm, Patient Position: Sitting, Cuff Size: Normal)   Pulse 75   Ht 5\' 6"  (1.676 m)   Wt 110 lb 3.2 oz (50 kg)   SpO2 90%   BMI 17.79 kg/m    Wt Readings from Last 3 Encounters:  08/06/23 110 lb 3.2 oz (50 kg)  07/24/23 107 lb 8 oz (48.8 kg)  07/07/23 107 lb 12.8 oz (48.9 kg)    GEN: Thin, elderly female. Sitting comfortably in the chair in  no acute distress  NECK: No JVD CARDIAC: RRR, no murmurs, rubs, gallops. Radial pulses 2+ bilaterally  RESPIRATORY:  Clear to auscultation without rales, wheezing or rhonchi. Normal WOB on room air   ABDOMEN: Soft, non-tender, non-distended EXTREMITIES:  No edema in BLE; No deformity   ASSESSMENT AND PLAN: .   Paroxysmal Atrial Fibrillation  - Currently on metoprolol tartrate 25 mg BID, eliquis 5 mg BID  - Reports occasional palpitations when she is anxious. HR regular today on exam, suspect NSR  - Palpitations are triggered by anxiety and are relieved when she is able to calm down. She is considering asking her pain doctor to decrease her pain medication so she can take anxiety medications, like Klonopin. She is also trying  to get in with a therapist. I encouraged therapy as a way to better control her anxiety - With her borderline low BP, I do not think it would be beneficial to increase her BB at this time - Continue metoprolol tartrate 25 mg BID  - Continue eliquis 5 mg BID. Patient has continued to have some nose bleeds. Reassured her that her hemoglobin is stable/normal despite these bleeds. Discussed using humidifier. Patient understands her stroke risk if she were to come off eliquis, and she is willing to continue the medication   HTN  - Current regiment includes lasix, metoprolol tartrate 25 mg BID - BP well controlled. She denies symptoms of orthostatic hypotension - Continue current medications  - Creatinine 0.65 and potassium 4.7 earlier this month   Chronic HFpEF  Chronic Dyspnea  - Most recent echocardiogram from 01/2023 showed EF 55-60%, mild LVH, moderately reduced RV function with moderately elevated PA systolic pressure, moderate TR - Patient currently on lasix 40 mg and 60 mg alternating every other day. Also on K supplementation  - She is euvolemic on exam. While she continues to have shortness of breath, she is at her baseline  - Continue current lasix dose and K supplementation  - Also followed by pulmonology- has underlying bronchiectasis. Pulm suspects shortness of breath is multifactorial from moderate restriction on PFTs, bronchiectasis, and deconditioning  - K 4.7, creatinine 0.65 earlier this month on current dose of lasix   Chest pain  Nonobstructive CAD  - Patient has a history of chronic, atypical chest pain. Previous cath in 2021 showed mild nonobstructive CAD  - Patient reports having 1 episode of chest pain, about a week ago. Pain lasted all day, was not associated with exertion nor relieved by rest. Pain has not recurred since. She denies exertional chest pain  - While pain is atypical for a cardiac source, I did offer stress test for reassurance. Patient declined  - Has  previously declined statins in the past  - Not on ASA due to eliquis use   OSA  - Previously found to have nocturnal hypoxemia on overnight oximetry test in 09/2022  - Patient has refused CPAP in the past. Continues to decline   S/p Mitral Valve Repair   Most recent echo from 01/2023 showed trace mitral regurgitation and no significant mitral stenosis. 32 mm Sorin Memo 4D prosthetic annuloplasty ring in the mitral position.  - Continue SBE prophylaxis.  - Anticipate echo in 01/2024 for monitoring   Anxiety  - Followed by PCP  - As above, I encouraged her efforts to establish care with a therapist    Dispo: Follow up with Dr. Jens Som as scheduled   Signed, Jonita Albee, PA-C

## 2023-08-06 ENCOUNTER — Telehealth: Payer: Self-pay | Admitting: Family Medicine

## 2023-08-06 ENCOUNTER — Encounter: Payer: Self-pay | Admitting: Cardiology

## 2023-08-06 ENCOUNTER — Ambulatory Visit: Payer: Medicare Other | Attending: Cardiology | Admitting: Cardiology

## 2023-08-06 VITALS — BP 108/58 | HR 75 | Ht 66.0 in | Wt 110.2 lb

## 2023-08-06 DIAGNOSIS — I480 Paroxysmal atrial fibrillation: Secondary | ICD-10-CM

## 2023-08-06 DIAGNOSIS — I10 Essential (primary) hypertension: Secondary | ICD-10-CM | POA: Diagnosis not present

## 2023-08-06 DIAGNOSIS — Z9889 Other specified postprocedural states: Secondary | ICD-10-CM | POA: Diagnosis not present

## 2023-08-06 DIAGNOSIS — R0602 Shortness of breath: Secondary | ICD-10-CM

## 2023-08-06 DIAGNOSIS — G4733 Obstructive sleep apnea (adult) (pediatric): Secondary | ICD-10-CM

## 2023-08-06 DIAGNOSIS — I5032 Chronic diastolic (congestive) heart failure: Secondary | ICD-10-CM | POA: Diagnosis not present

## 2023-08-06 DIAGNOSIS — R0789 Other chest pain: Secondary | ICD-10-CM

## 2023-08-06 MED ORDER — LEVOTHYROXINE SODIUM 75 MCG PO TABS: 75.0000 ug | ORAL_TABLET | Freq: Every day | ORAL | 0 refills | Status: DC

## 2023-08-06 NOTE — Telephone Encounter (Signed)
Called patient to schedule sooner appointment. Pt thought she had shingles but stated she is feeling better today. Insisted to cancel her appointment and she wants to wait the weekend and see if she continues to improve. If not she will go to urgent care for eval and treatment or will call us back to schedule appointment at the beginning of the week. This is in reference to the nurse triage note from 08/05/23

## 2023-08-06 NOTE — Telephone Encounter (Signed)
FYI

## 2023-08-06 NOTE — Patient Instructions (Signed)
Medication Instructions:  No changes *If you need a refill on your cardiac medications before your next appointment, please call your pharmacy*  Lab Work: No labs  Testing/Procedures: No testing  Follow-Up: At [facility], you and your health needs are our priority.  As part of our continuing mission to provide you with exceptional heart care, we have created designated Provider Care Teams.  These Care Teams include your primary Cardiologist (physician) and Advanced Practice Providers (APPs -  Physician Assistants and Nurse Practitioners) who all work together to provide you with the care you need, when you need it.  We recommend signing up for the patient portal called "MyChart".  Sign up information is provided on this After Visit Summary.  MyChart is used to connect with patients for Virtual Visits (Telemedicine).  Patients are able to view lab/test results, encounter notes, upcoming appointments, etc.  Non-urgent messages can be sent to your provider as well.   To learn more about what you can do with MyChart, go to [email or web address].    Your next appointment:   Already Scheduled  Provider:   Olga Millers, MD    Other Instructions

## 2023-08-06 NOTE — Telephone Encounter (Signed)
[employer]) has canceled appt.

## 2023-08-06 NOTE — Telephone Encounter (Signed)
This was sent in yesterday

## 2023-08-06 NOTE — Telephone Encounter (Signed)
New script was sent to pharmacy.

## 2023-08-06 NOTE — Telephone Encounter (Signed)
Please advise

## 2023-08-06 NOTE — Telephone Encounter (Signed)
Per Dr.Tabori Pt needs to be seen today. Please find another location for her to be seen.

## 2023-08-06 NOTE — Addendum Note (Signed)
Addended by: Sheliah Hatch on: 08/06/2023 11:32 AM   Modules accepted: Orders

## 2023-08-06 NOTE — Telephone Encounter (Signed)
See new encounter today 08/06/23. Would not let me addend for some reason

## 2023-08-07 ENCOUNTER — Ambulatory Visit: Payer: Medicare Other | Admitting: Family Medicine

## 2023-08-13 DIAGNOSIS — F411 Generalized anxiety disorder: Secondary | ICD-10-CM | POA: Diagnosis not present

## 2023-08-14 ENCOUNTER — Other Ambulatory Visit (HOSPITAL_COMMUNITY): Payer: Self-pay

## 2023-08-18 ENCOUNTER — Telehealth: Payer: Self-pay | Admitting: Cardiology

## 2023-08-18 NOTE — Telephone Encounter (Signed)
Patient called and said that she is taking a medication called Kenalog for pain in neck and pain. Patient will take injections on Thursday and want to make sure it don't make her go back in afib

## 2023-08-19 NOTE — Telephone Encounter (Signed)
Returned call to patient.  She states she can't take steroids because they cause her to go into AFib.  States in the past would get flushed face and increased HR day after Kenalog injections - years ago before had heart surgery (mitral valve repair)  Assured her that she should be fine to try the Kenalog, but we can't promise that she will be side effect free.  Reviewed symptoms and when she should go to ED.     Patient thanked me for my time.

## 2023-08-20 DIAGNOSIS — M7912 Myalgia of auxiliary muscles, head and neck: Secondary | ICD-10-CM | POA: Diagnosis not present

## 2023-08-20 DIAGNOSIS — M7918 Myalgia, other site: Secondary | ICD-10-CM | POA: Diagnosis not present

## 2023-08-20 DIAGNOSIS — G603 Idiopathic progressive neuropathy: Secondary | ICD-10-CM | POA: Diagnosis not present

## 2023-08-20 DIAGNOSIS — G5603 Carpal tunnel syndrome, bilateral upper limbs: Secondary | ICD-10-CM | POA: Diagnosis not present

## 2023-08-23 ENCOUNTER — Other Ambulatory Visit: Payer: Self-pay | Admitting: Family Medicine

## 2023-08-24 DIAGNOSIS — R040 Epistaxis: Secondary | ICD-10-CM | POA: Diagnosis not present

## 2023-08-24 NOTE — Telephone Encounter (Signed)
Requested Prescriptions   Pending Prescriptions Disp Refills   meclizine (ANTIVERT) 25 MG tablet [Pharmacy Med Name: Meclizine HCl 25 MG Oral Tablet] 45 tablet 0    Sig: TAKE 1 TABLET BY MOUTH THREE TIMES DAILY AS NEEDED FOR DIZZINESS     Date of patient request: 08/24/2023 Last office visit: 07/24/2023 Upcoming visit: Visit date not found Date of last refill: 02/02/2023 Last refill amount: 45

## 2023-08-25 ENCOUNTER — Telehealth: Payer: Self-pay | Admitting: Cardiology

## 2023-08-25 NOTE — Telephone Encounter (Signed)
Spoke with pt, aware of recommendations. She reports she is now having panic attacks related to the nose bleeds and is aware of the follow up appointment.

## 2023-08-25 NOTE — Telephone Encounter (Signed)
Pt c/o medication issue:  1. Name of Medication:  apixaban (ELIQUIS) 5 MG TABS tablet  2. How are you currently taking this medication (dosage and times per day)?   3. Are you having a reaction (difficulty breathing--STAT)?   4. What is your medication issue?   Patient states she has a history of nose bleeds and she has had 3 recently and yesterday was the worst. She assumes she need to be taken off her blood thinner again for a while. Please advise.

## 2023-08-28 ENCOUNTER — Other Ambulatory Visit: Payer: Self-pay | Admitting: Family Medicine

## 2023-09-04 ENCOUNTER — Telehealth: Payer: Self-pay | Admitting: Cardiology

## 2023-09-04 DIAGNOSIS — R911 Solitary pulmonary nodule: Secondary | ICD-10-CM | POA: Diagnosis not present

## 2023-09-04 DIAGNOSIS — E039 Hypothyroidism, unspecified: Secondary | ICD-10-CM | POA: Diagnosis not present

## 2023-09-04 DIAGNOSIS — G629 Polyneuropathy, unspecified: Secondary | ICD-10-CM | POA: Diagnosis not present

## 2023-09-04 DIAGNOSIS — N301 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: Secondary | ICD-10-CM | POA: Diagnosis not present

## 2023-09-04 DIAGNOSIS — I499 Cardiac arrhythmia, unspecified: Secondary | ICD-10-CM | POA: Diagnosis not present

## 2023-09-04 DIAGNOSIS — R040 Epistaxis: Secondary | ICD-10-CM | POA: Diagnosis not present

## 2023-09-04 DIAGNOSIS — K219 Gastro-esophageal reflux disease without esophagitis: Secondary | ICD-10-CM | POA: Diagnosis not present

## 2023-09-04 DIAGNOSIS — I10 Essential (primary) hypertension: Secondary | ICD-10-CM | POA: Diagnosis not present

## 2023-09-04 DIAGNOSIS — M810 Age-related osteoporosis without current pathological fracture: Secondary | ICD-10-CM | POA: Diagnosis not present

## 2023-09-04 DIAGNOSIS — E43 Unspecified severe protein-calorie malnutrition: Secondary | ICD-10-CM | POA: Diagnosis not present

## 2023-09-04 NOTE — Telephone Encounter (Signed)
 Patient wanted to inform Dr. Jens Som that this is the second nose bleed she has had in 2 weeks and she is headed to her ENT specialist. She states she may need to be taken off blood thinner again.

## 2023-09-07 ENCOUNTER — Ambulatory Visit (INDEPENDENT_AMBULATORY_CARE_PROVIDER_SITE_OTHER)
Admission: EM | Admit: 2023-09-07 | Discharge: 2023-09-07 | Disposition: A | Discharge status: Home / Self Care | Source: Home / Self Care

## 2023-09-07 ENCOUNTER — Emergency Department (HOSPITAL_BASED_OUTPATIENT_CLINIC_OR_DEPARTMENT_OTHER): Admission: EM | Admit: 2023-09-07 | Discharge: 2023-09-07 | Disposition: A | Discharge status: Home / Self Care

## 2023-09-07 ENCOUNTER — Other Ambulatory Visit: Payer: Self-pay

## 2023-09-07 ENCOUNTER — Emergency Department (HOSPITAL_BASED_OUTPATIENT_CLINIC_OR_DEPARTMENT_OTHER)

## 2023-09-07 ENCOUNTER — Encounter (HOSPITAL_BASED_OUTPATIENT_CLINIC_OR_DEPARTMENT_OTHER): Payer: Self-pay | Admitting: Emergency Medicine

## 2023-09-07 DIAGNOSIS — M542 Cervicalgia: Secondary | ICD-10-CM | POA: Diagnosis not present

## 2023-09-07 DIAGNOSIS — R519 Headache, unspecified: Secondary | ICD-10-CM | POA: Diagnosis not present

## 2023-09-07 DIAGNOSIS — Z7901 Long term (current) use of anticoagulants: Secondary | ICD-10-CM | POA: Diagnosis not present

## 2023-09-07 DIAGNOSIS — I480 Paroxysmal atrial fibrillation: Secondary | ICD-10-CM | POA: Diagnosis not present

## 2023-09-07 DIAGNOSIS — Z91040 Latex allergy status: Secondary | ICD-10-CM | POA: Insufficient documentation

## 2023-09-07 DIAGNOSIS — D6869 Other thrombophilia: Secondary | ICD-10-CM | POA: Insufficient documentation

## 2023-09-07 DIAGNOSIS — I5032 Chronic diastolic (congestive) heart failure: Secondary | ICD-10-CM | POA: Insufficient documentation

## 2023-09-07 DIAGNOSIS — R040 Epistaxis: Secondary | ICD-10-CM | POA: Diagnosis present

## 2023-09-07 DIAGNOSIS — I110 Hypertensive heart disease with heart failure: Secondary | ICD-10-CM | POA: Insufficient documentation

## 2023-09-07 DIAGNOSIS — I6782 Cerebral ischemia: Secondary | ICD-10-CM | POA: Diagnosis not present

## 2023-09-07 DIAGNOSIS — N3 Acute cystitis 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: Secondary | ICD-10-CM | POA: Insufficient documentation

## 2023-09-07 LAB — CBC WITH DIFFERENTIAL/PLATELET
Abs Immature Granulocytes: 0.01 10*3/uL (ref 0.00–0.07)
Basophils Absolute: 0 10*3/uL (ref 0.0–0.1)
Basophils Relative: 0 %
Eosinophils Absolute: 0.1 10*3/uL (ref 0.0–0.5)
Eosinophils Relative: 2 %
HCT: 38.7 % (ref 36.0–46.0)
Hemoglobin: 12.3 g/dL (ref 12.0–15.0)
Immature Granulocytes: 0 %
Lymphocytes Relative: 14 %
Lymphs Abs: 0.8 10*3/uL (ref 0.7–4.0)
MCH: 29.6 pg (ref 26.0–34.0)
MCHC: 31.8 g/dL (ref 30.0–36.0)
MCV: 93.3 fL (ref 80.0–100.0)
Monocytes Absolute: 0.4 10*3/uL (ref 0.1–1.0)
Monocytes Relative: 8 %
Neutro Abs: 4.3 10*3/uL (ref 1.7–7.7)
Neutrophils Relative %: 76 %
Platelets: 153 10*3/uL (ref 150–400)
RBC: 4.15 MIL/uL (ref 3.87–5.11)
RDW: 13.3 % (ref 11.5–15.5)
WBC: 5.6 10*3/uL (ref 4.0–10.5)
nRBC: 0 % (ref 0.0–0.2)

## 2023-09-07 LAB — PROTIME-INR
INR: 1 (ref 0.8–1.2)
Prothrombin Time: 13.5 s (ref 11.4–15.2)

## 2023-09-07 LAB — BASIC METABOLIC PANEL
Anion gap: 12 (ref 5–15)
BUN: 16 mg/dL (ref 8–23)
CO2: 25 mmol/L (ref 22–32)
Calcium: 9 mg/dL (ref 8.9–10.3)
Chloride: 99 mmol/L (ref 98–111)
Creatinine, Ser: 0.68 mg/dL (ref 0.44–1.00)
GFR, Estimated: >60 mL/min (ref >60)
Glucose, Bld: 94 mg/dL (ref 70–99)
Potassium: 4.2 mmol/L (ref 3.5–5.1)
Sodium: 136 mmol/L (ref 135–145)

## 2023-09-07 LAB — POCT URINALYSIS DIP (MANUAL ENTRY)
Blood, UA: NEGATIVE
Glucose, UA: NEGATIVE mg/dL
Ketones, POC UA: NEGATIVE mg/dL
Nitrite, UA: NEGATIVE
Spec Grav, UA: 1.02 (ref 1.010–1.025)
Urobilinogen, UA: 0.2 U/dL
pH, UA: 6 (ref 5.0–8.0)

## 2023-09-07 LAB — APTT: aPTT: 32 s (ref 24–36)

## 2023-09-07 MED ORDER — CEFDINIR 300 MG PO CAPS: 300.0000 mg | ORAL_CAPSULE | Freq: Two times a day (BID) | ORAL | 0 refills | Status: AC

## 2023-09-07 NOTE — Telephone Encounter (Signed)
 Spoke with pt, she is currently at the ER on [address]. Aware she does not need to restart eliquis per dr Jens Som.

## 2023-09-07 NOTE — ED Provider Notes (Signed)
 [facility]    CSN: [id number] Arrival date & time: 09/07/23  1024      History   Chief Complaint Chief Complaint  Patient presents with   Headache    HPI [name] is a 78 y.o. female.   HPI Pleasant 78 year old female presents with headache since yesterday.  Patient reports headache returns once Tylenol or oxycodone wears off.  Patient reports history of neck surgery.  Additionally, patient reports ENT/cardiology has advised patient to hold apixaban currently due to cauterization of right nostril due to epistaxis on Friday, 09/04/2023.  PMH significant for PAF, HTN and chronic diastolic heart failure.  Past Medical History:  Diagnosis Date   Allergies    Anxiety    Arthritis    "maybe in my back" (03/31/2018)   Benign paroxysmal positional vertigo 06/08/2013   Complication of anesthesia    Fracture of multiple ribs 2015   "don't know from what; dx'd when I in hospital for 1st back OR" (03/31/2018)   GERD (gastroesophageal reflux disease)    Hair loss 04/12/2012   Herpes    History of blood transfusion    "twice; related to back OR" (03/31/2018)   History of kidney stones    Interstitial cystitis 11/06/2011   Melanoma of ankle (HCC) ~ 2003   "right"   Mitral regurgitation    Osteopenia 02/18/2012   Osteoporosis    PAF (paroxysmal atrial fibrillation) (HCC) 2012   Peripheral neuropathy 11/06/2011   PMDD (premenstrual dysphoric disorder)    PONV (postoperative nausea and vomiting)    nausea, vomiting, hives and dizziness    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/2020   Complete bilateral atrial lesion set using cryothermy and bipolar radiofrequency ablation with clipping of LA appendage via right mini-thoracotomy approach   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   Complex valvuloplasty including artificial Gore-tex neochord placement x12 with 32mm Sorin Memo 4D ring annuloplasty   Seasonal allergies    Vaginal delivery    ONE NSVD   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    Patient Active Problem List   Diagnosis Date Noted   Bronchiectasis (HCC) 12/23/2022   Raynaud's phenomenon 10/28/2022   Nocturnal hypoxemia 10/27/2022   Trigeminal neuralgia 10/27/2022   Dysphagia 07/15/2022   Chronic Eustachian tube dysfunction, bilateral 07/15/2022   Nodule of right lung 02/06/2022   Closed fracture of left patella with routine healing 11/21/2021   Closed fracture of lateral portion of left tibial plateau 11/07/2021   Tachycardia 10/17/2021   HTN (hypertension) 06/20/2021   Visit for wound check 04/29/2021   Therapeutic opioid-induced constipation (OIC) 04/03/2021   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   Atrial fibrillation (HCC) 02/09/2020   Long term (current) use of anticoagulants 02/09/2020   Debility 02/02/2020   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/2020   Severe mitral regurgitation    Mitral regurgitation due to cusp prolapse    Ankle swelling, left 10/20/2019   Ankle swelling, right 10/20/2019   Secondary hypercoagulable state (HCC) 08/19/2019   Left inguinal hernia 03/11/2019   Visit for monitoring Tikosyn therapy 03/29/2018   Osteoporosis 09/23/2017   Fatigue 02/18/2017   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   Senile osteoporosis 08/01/2016   Physical exam 04/21/2016   Positional lightheadedness 11/16/2015   B12 deficiency 10/01/2015   Hearing loss due to cerumen impaction 08/23/2015   Foot pain, right 05/23/2015   Severe protein-calorie malnutrition (HCC) 10/19/2014   Fracture of multiple ribs 08/07/2014   SOB (shortness  of breath) 08/06/2014   Constipation 07/14/2014   Vitamin B12 deficiency anemia, unspecified 06/19/2014   Hypokalemia 06/19/2014   Other chronic pain 06/19/2014   Zoster without complications 06/19/2014   Vitreous degeneration, bilateral 06/19/2014   Nutritional deficiency, unspecified 06/19/2014   Nocturia 06/19/2014   Other symbolic dysfunctions 06/19/2014    Unsteadiness on feet 06/19/2014   Unspecified osteoarthritis, unspecified site 06/19/2014   Muscle weakness (generalized) 06/19/2014   Major depressive disorder, single episode, unspecified 06/19/2014   Frequency of micturition 06/19/2014   Encounter for orthopedic aftercare following scoliosis surgery 06/19/2014   Cardiac arrhythmia, unspecified 06/19/2014   Age-related nuclear cataract, unspecified eye 06/19/2014   Hyperlipidemia 06/08/2013   Lumbar back pain 05/19/2012   Ureteric stone 02/18/2012   Leg pain, bilateral 11/17/2011   Hereditary and idiopathic peripheral neuropathy 11/06/2011   S/P cervical spinal fusion 11/06/2011   Gastro-esophageal reflux disease without esophagitis 11/06/2011   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 11/06/2011   Personal history of other diseases of the circulatory system 11/06/2011   Melanoma of skin (HCC) 11/06/2011   History of cardiovascular disorder 11/06/2011   Chronic interstitial cystitis 04/09/2011   Allergic rhinitis 04/25/2010   Vulvodynia, 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   Anxiety and depression 03/04/2010   Malaise and fatigue 02/05/2010   Vitamin D deficiency 01/28/2010   Neoplasm of uncertain behavior of skin 01/25/2010   Disorder of bone and cartilage 01/25/2010   Genital herpes simplex 11/01/2009   Herpes simplex virus (HSV) infection 11/01/2009   Herpetic vulvovaginitis 10/23/2009   Kidney stone 08/09/2009   Brachial neuritis 07/23/2009   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    Past Surgical History:  Procedure Laterality Date   ANTERIOR CERVICAL DECOMP/DISCECTOMY FUSION  ~ 2003   BACK SURGERY     BREAST SURGERY     BREAST BIOPSY--RIGHT BENIGN   BUNIONECTOMY Bilateral    CARDIOVERSION N/A 01/26/2020   Procedure: CARDIOVERSION;  Surgeon: Sande Rives, MD;  Location: [facility] ENDOSCOPY;  Service: Cardiovascular;  Laterality: N/A;   COSMETIC SURGERY  2016   "back of my neck; related to earlier fusion"    CYSTOSCOPY W/ STONE MANIPULATION  "several times"   DILATION AND CURETTAGE OF UTERUS     FOREHEAD RECONSTRUCTION Right    "removed bone protruding out of my forehead"   HARDWARE REMOVAL  2016   "related to neck OR"   INCONTINENCE SURGERY     IR THORACENTESIS ASP PLEURAL SPACE W/IMG GUIDE  02/16/2020   MINIMALLY INVASIVE MAZE PROCEDURE N/A 01/17/2020   Procedure: MINIMALLY INVASIVE MAZE PROCEDURE;  Surgeon: Purcell Nails, MD;  Location: [facility] OR;  Service: Open Heart Surgery;  Laterality: N/A;   MITRAL VALVE REPAIR Right 01/17/2020   Procedure: MINIMALLY INVASIVE MITRAL VALVE REPAIR (MVR) USING MEMO 4D ;  Surgeon: Purcell Nails, MD;  Location: [facility] OR;  Service: Open Heart Surgery;  Laterality: Right;   POSTERIOR CERVICAL FUSION/FORAMINOTOMY  ~ 2008; 2015   RIGHT/LEFT HEART CATH AND CORONARY ANGIOGRAPHY N/A 12/02/2019   Procedure: RIGHT/LEFT HEART CATH AND CORONARY ANGIOGRAPHY;  Surgeon: Kathleene Hazel, MD;  Location: [facility] INVASIVE CV LAB;  Service: Cardiovascular;  Laterality: N/A;   SHOULDER ARTHROSCOPY W/ ROTATOR CUFF REPAIR Right 2012   SPINAL FUSION  06/2014 - 2018 X ?7   "scoliosis; my entire back"   TEE WITHOUT CARDIOVERSION N/A 11/21/2019   Procedure: TRANSESOPHAGEAL ECHOCARDIOGRAM (TEE);  Surgeon: Wendall Stade, MD;  Location: [facility] ENDOSCOPY;  Service: Cardiovascular;  Laterality: N/A;   TEE WITHOUT CARDIOVERSION N/A  01/17/2020   Procedure: TRANSESOPHAGEAL ECHOCARDIOGRAM (TEE);  Surgeon: Purcell Nails, MD;  Location: [facility] OR;  Service: Open Heart Surgery;  Laterality: N/A;   TEE WITHOUT CARDIOVERSION N/A 01/26/2020   Procedure: TRANSESOPHAGEAL ECHOCARDIOGRAM (TEE);  Surgeon: Sande Rives, MD;  Location: [facility] ENDOSCOPY;  Service: Cardiovascular;  Laterality: N/A;   TUBAL LIGATION     VAGINAL HYSTERECTOMY     TVH    OB History     Gravida  1   Para  1   Term  1   Preterm      AB      Living  1      SAB      IAB      Ectopic      Multiple       Live Births  1            Home Medications    Prior to Admission medications   Medication Sig Start Date End Date Taking? Authorizing Provider  cefdinir (OMNICEF) 300 MG capsule Take 1 capsule (300 mg total) by mouth 2 (two) times daily for 7 days. 09/07/23 09/14/23 Yes Trevor Iha, FNP  acetaminophen (TYLENOL) 500 MG tablet Take 500 mg by mouth every 6 (six) hours as needed for moderate pain.     [provider]  albuterol (VENTOLIN HFA) 108 (90 Base) MCG/ACT inhaler INHALE 1 TO 2 PUFFS BY MOUTH EVERY 6 HOURS AS NEEDED 08/05/23   Martina Sinner, MD  apixaban (ELIQUIS) 5 MG TABS tablet Take 1 tablet (5 mg total) by mouth 2 (two) times daily. 06/12/23   Lewayne Bunting, MD  Ascorbic Acid (VITAMIN C PO) Take by mouth daily.    [provider]  b complex vitamins tablet Take 1 tablet by mouth daily.    [provider]  Calcium Carbonate Antacid (TUMS CHEWY BITES PO) Take 1 tablet by mouth daily as needed (reflux).     [provider]  Calcium Citrate-Vitamin D (CALCIUM + D PO) Take 1 tablet by mouth daily.    [provider]  Carboxymethylcellul-Glycerin (LUBRICATING EYE DROPS OP) Place 1 drop into both eyes daily as needed (dry eyes).    [provider]  cephALEXin (KEFLEX) 500 MG capsule Take 2 grams (4 tablets) by mouth 1 hour prior to dental cleaning/procedure. 03/27/22   Meriam Sprague, MD  clonazePAM Scarlette Calico) 0.5 MG tablet Take 1 tablet by mouth twice daily as needed for anxiety 01/26/23   Sheliah Hatch, MD  cyanocobalamin (VITAMIN B12) 1000 MCG/ML injection Inject 1,000 mcg into the muscle every 30 (thirty) days. 07/09/23   [provider]  denosumab (PROLIA) 60 MG/ML SOSY injection Inject 60 mg into the skin every 6 (six) months.    [provider]  doxepin (SINEQUAN) 10 MG capsule Take 10 mg by mouth at bedtime. Patient not taking: Reported on 07/07/2023 04/26/23   [provider]   DULoxetine (CYMBALTA) 60 MG capsule Take 60 mg by mouth daily. Patient not taking: Reported on 07/07/2023 04/17/23   [provider]  furosemide (LASIX) 20 MG tablet Take 1 tablet (20 mg total) by mouth every other day. 05/12/23 08/10/23  Jodelle Gross, NP  furosemide (LASIX) 40 MG tablet Take 1.5 tablets (60 mg total) by mouth as directed. Take 1.5 tablet by mouth on Tuesday, Thursday, and Saturday. 10/03/22   Jodelle Gross, NP  levothyroxine (SYNTHROID) 75 MCG tablet Take 1 tablet (75 mcg total) by mouth daily. 08/06/23  Sheliah Hatch, MD  meclizine (ANTIVERT) 25 MG tablet TAKE 1 TABLET BY MOUTH THREE TIMES DAILY AS NEEDED FOR DIZZINESS 08/24/23   Sheliah Hatch, MD  metoprolol tartrate (LOPRESSOR) 25 MG tablet Take 1 tablet (25 mg total) by mouth 2 (two) times daily. 10/07/22   Lewayne Bunting, MD  mupirocin ointment (BACTROBAN) 2 % Apply 1 Application topically daily. 08/14/22   [provider]  Nutritional Supplements (,FEEDING SUPPLEMENT, PROSOURCE PLUS) liquid Take 30 mLs by mouth 2 (two) times daily between meals. 02/08/20   Love, Evlyn Kanner, PA-C  nystatin (MYCOSTATIN/NYSTOP) powder Apply topically 2 (two) times daily as needed (skin irritation (under breasts)). 02/08/20   Love, Evlyn Kanner, PA-C  ondansetron (ZOFRAN) 8 MG tablet Take 1 tablet (8 mg total) by mouth every 8 (eight) hours as needed for nausea or vomiting. Patient taking differently: Take 8 mg by mouth every 8 (eight) hours as needed for nausea or vomiting. 3 tablets as needed 07/24/23   Sheliah Hatch, MD  oxyCODONE (ROXICODONE) 5 MG/5ML solution Take 4 mLs (4 mg total) by mouth every 4 (four) hours as needed for moderate pain. 02/08/20   Love, Evlyn Kanner, PA-C  pantoprazole (PROTONIX) 40 MG tablet Take 1 tablet by mouth once daily 05/29/23   Martina Sinner, MD  pantoprazole (PROTONIX) 40 MG tablet Take 1 tablet (40 mg total) by mouth daily. 05/28/23   Martina Sinner, MD  polyethylene glycol  (MIRALAX / GLYCOLAX) packet Take 17 g by mouth daily as needed for mild constipation.    [provider]  potassium chloride SA (KLOR-CON M) 20 MEQ tablet Take 1 tablet (20 mEq total) by mouth daily. 07/18/22   Meriam Sprague, MD  revefenacin (YUPELRI) 175 MCG/3ML nebulizer solution Take 3 mLs (175 mcg total) by nebulization daily. 11/17/22   Martina Sinner, MD  sodium chloride (OCEAN) 0.65 % SOLN nasal spray Place 1 spray into both nostrils as needed for congestion (nose bleeds).    [provider]  valACYclovir (VALTREX) 500 MG tablet Take 500 mg by mouth daily as needed (breakouts).  07/15/19   [provider]  VITAMIN D, CHOLECALCIFEROL, PO Take by mouth daily.    [provider]  Rayburn Ma Oil (ARTIFICIAL TEARS) ointment Place into both eyes as needed.    [provider]    Family History Family History  Problem Relation Age of Onset   Diabetes Father    Hyperlipidemia Sister    Heart disease Sister    Stroke Sister    Diabetes Brother    Hyperlipidemia Sister    Heart disease Sister    Arthritis Mother    Heart disease Mother    Uterine cancer Mother    Diabetes Brother    Heart Problems Brother     Social History Social History   Tobacco Use   Smoking status: Former    Current packs/day: 0.00    Average packs/day: 0.1 packs/day for 14.0 years (1.4 ttl pk-yrs)    Types: Cigarettes    Start date: 1966    Quit date: 1980    Years since quitting: 45.2   Smokeless tobacco: Never   Tobacco comments:    03/31/2018 "quit ~ 1980; someday smoker when I did smoke; never addicted"  Vaping Use   Vaping status: Never Used  Substance Use Topics   Alcohol use: Never   Drug use: Yes    Types: Oxycodone, Benzodiazepines    Comment: 03/31/2018 "for chronic neck  and back pain", takes Klonopin at times.      Allergies   Buprenorphine, Erythromycin, Iodinated contrast media, Latex, Other, Tape, Ciprofloxacin,  Duloxetine hcl, Erythromycin base, Gabapentin, Hydrocodone, Hydromorphone, Iodine, Levofloxacin, Metrizamide, Nsaids, Nucynta [tapentadol hcl], Oxycodone, Pentazocine, Pregabalin, Septra [bactrim], Sulfasalazine, Morphine, Sulfa antibiotics, Sulfamethoxazole-trimethoprim, and Tapentadol   Review of Systems Review of Systems  Neurological:  Positive for headaches.     Physical Exam Triage Vital Signs ED Triage Vitals  Encounter Vitals Group     BP      Systolic BP Percentile      Diastolic BP Percentile      Pulse      Resp      Temp      Temp src      SpO2      Weight      Height      Head Circumference      Peak Flow      Pain Score      Pain Loc      Pain Education      Exclude from Growth Chart    No data found.  Updated Vital Signs BP 136/76 (BP Location: Left Arm)   Pulse 70   Temp (!) 97.5 F (36.4 C) (Oral)   Resp 17   SpO2 97%    Physical Exam Vitals and nursing note reviewed.  Constitutional:      Appearance: Normal appearance. She is normal weight.  HENT:     Head: Normocephalic and atraumatic.     Right Ear: Tympanic membrane, ear canal and external ear normal.     Left Ear: Tympanic membrane, ear canal and external ear normal.     Mouth/Throat:     Mouth: Mucous membranes are moist.     Pharynx: Oropharynx is clear.  Eyes:     Extraocular Movements: Extraocular movements intact.     Conjunctiva/sclera: Conjunctivae normal.     Pupils: Pupils are equal, round, and reactive to light.  Cardiovascular:     Rate and Rhythm: Normal rate and regular rhythm.     Pulses: Normal pulses.     Heart sounds: Normal heart sounds.  Pulmonary:     Effort: Pulmonary effort is normal.     Breath sounds: Normal breath sounds. No wheezing, rhonchi or rales.  Musculoskeletal:        General: Normal range of motion.     Cervical back: Normal range of motion and neck supple.  Skin:    General: Skin is warm and dry.  Neurological:     General: No focal deficit  present.     Mental Status: She is alert and oriented to person, place, and time. Mental status is at baseline.  Psychiatric:        Mood and Affect: Mood normal.        Behavior: Behavior normal.      UC Treatments / Results  Labs (all labs ordered are listed, but only abnormal results are displayed) Labs Reviewed  POCT URINALYSIS DIP (MANUAL ENTRY) - Abnormal; Notable for the following components:      Result Value   Bilirubin, UA small (*)    Protein Ur, POC trace (*)    Leukocytes, UA Trace (*)    All other components within normal limits  URINE CULTURE    EKG   Radiology No results found.  Procedures Procedures (including critical care time)  Medications Ordered in UC Medications - No data to display  Initial Impression /  Assessment and Plan / UC Course  I have reviewed the triage vital signs and the nursing notes.  Pertinent labs & imaging results that were available during my care of the patient were reviewed by me and considered in my medical decision making (see chart for details).     MDM: 1.  Headache disorder-patient is concerned that current headache over right occipital parietal area of skull is aneurysm and wishes to have further evaluation/imaging to rule out cerebral aneurysm. Advised patient to go to [facility] ED now for further evaluation of headache.  Patient agreed and verbalized understanding of these instructions and this plan of care today. 2.  Acute cystitis 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-UA revealed above, urine culture ordered, Rx'd cefdinir 300 mg capsule: Take 1 capsule twice daily x 7 days.  Advised patient to take medication as directed with food to completion.  Encouraged to increase daily water intake to 64 ounces per day while taking this medication.  Advised we will follow-up with urine culture results once received.  Advised if symptoms worsen and/or unresolved please follow-up with your PCP or here for further evaluation.   Patient discharged to ED, hemodynamically stable. Final Clinical Impressions(s) / UC Diagnoses   Final diagnoses:  Headache disorder  Acute cystitis 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     Discharge Instructions      Advised patient to go to [facility] ED now for further evaluation of headache.  Patient is concerned with cerebral aneurysm.  Advised patient to take medication as directed with food to completion.  Encouraged to increase daily water intake to 64 ounces per day while taking this medication.  Advised we will follow-up with urine culture results once received.  Advised if symptoms worsen and/or unresolved please follow-up with your PCP or here for further evaluation.     ED Prescriptions     Medication Sig Dispense Auth. Provider   cefdinir (OMNICEF) 300 MG capsule Take 1 capsule (300 mg total) by mouth 2 (two) times daily for 7 days. 14 capsule Trevor Iha, FNP      PDMP not reviewed this encounter.   Trevor Iha, FNP 09/07/23 1129

## 2023-09-07 NOTE — ED Provider Notes (Signed)
 [place] [facility] Provider Note   CSN: [id number] Arrival date & time: 09/07/23  1222     History  Chief Complaint  Patient presents with   Headache    [name] is a 78 y.o. female with medical history of paroxysmal A-fib on Eliquis, GERD, mitral regurgitation, interstitial cystitis.  The patient presents to the ED for evaluation of headache.  Reports that she often gets nosebleeds and does take a blood thinner for her A-fib.  Reports that she was advised to stop taking her blood thinner on Friday because of a nosebleed that she was experiencing.  States that on Friday she did receive a cauterization to her nose for her nosebleed.  States that beginning yesterday on Sunday she developed a headache located on the crown of her scalp.  Reports that she took Tylenol and oxycodone yesterday for this headache, headache resolved however returned this morning.  She is endorsing neck pain but states that she "always has neck pain" and this neck pain is no different from her typical neck pain.  She denies any one-sided weakness or numbness.  She denies any visual disturbances, nausea, vomiting, chest pain, shortness of breath, dizziness.  Reports has not taken blood thinner since Friday.   Headache Associated symptoms: neck pain   Associated symptoms: no dizziness, no fever, no nausea, no numbness, no photophobia, no vomiting and no weakness        Home Medications Prior to Admission medications   Medication Sig Start Date End Date Taking? Authorizing Provider  acetaminophen (TYLENOL) 500 MG tablet Take 500 mg by mouth every 6 (six) hours as needed for moderate pain.     [provider]  albuterol (VENTOLIN HFA) 108 (90 Base) MCG/ACT inhaler INHALE 1 TO 2 PUFFS BY MOUTH EVERY 6 HOURS AS NEEDED 08/05/23   Martina Sinner, MD  apixaban (ELIQUIS) 5 MG TABS tablet Take 1 tablet (5 mg total) by mouth 2 (two) times daily. 06/12/23   Lewayne Bunting,  MD  Ascorbic Acid (VITAMIN C PO) Take by mouth daily.    [provider]  b complex vitamins tablet Take 1 tablet by mouth daily.    [provider]  Calcium Carbonate Antacid (TUMS CHEWY BITES PO) Take 1 tablet by mouth daily as needed (reflux).     [provider]  Calcium Citrate-Vitamin D (CALCIUM + D PO) Take 1 tablet by mouth daily.    [provider]  Carboxymethylcellul-Glycerin (LUBRICATING EYE DROPS OP) Place 1 drop into both eyes daily as needed (dry eyes).    [provider]  cefdinir (OMNICEF) 300 MG capsule Take 1 capsule (300 mg total) by mouth 2 (two) times daily for 7 days. 09/07/23 09/14/23  Trevor Iha, FNP  cephALEXin (KEFLEX) 500 MG capsule Take 2 grams (4 tablets) by mouth 1 hour prior to dental cleaning/procedure. 03/27/22   Meriam Sprague, MD  clonazePAM Scarlette Calico) 0.5 MG tablet Take 1 tablet by mouth twice daily as needed for anxiety 01/26/23   Sheliah Hatch, MD  cyanocobalamin (VITAMIN B12) 1000 MCG/ML injection Inject 1,000 mcg into the muscle every 30 (thirty) days. 07/09/23   [provider]  denosumab (PROLIA) 60 MG/ML SOSY injection Inject 60 mg into the skin every 6 (six) months.    [provider]  doxepin (SINEQUAN) 10 MG capsule Take 10 mg by mouth at bedtime. Patient not taking: Reported on 07/07/2023 04/26/23   [provider]  DULoxetine (  CYMBALTA) 60 MG capsule Take 60 mg by mouth daily. Patient not taking: Reported on 07/07/2023 04/17/23   [provider]  furosemide (LASIX) 20 MG tablet Take 1 tablet (20 mg total) by mouth every other day. 05/12/23 08/10/23  Jodelle Gross, NP  furosemide (LASIX) 40 MG tablet Take 1.5 tablets (60 mg total) by mouth as directed. Take 1.5 tablet by mouth on Tuesday, Thursday, and Saturday. 10/03/22   Jodelle Gross, NP  levothyroxine (SYNTHROID) 75 MCG tablet Take 1 tablet (75 mcg total) by mouth daily. 08/06/23   Sheliah Hatch,  MD  meclizine (ANTIVERT) 25 MG tablet TAKE 1 TABLET BY MOUTH THREE TIMES DAILY AS NEEDED FOR DIZZINESS 08/24/23   Sheliah Hatch, MD  metoprolol tartrate (LOPRESSOR) 25 MG tablet Take 1 tablet (25 mg total) by mouth 2 (two) times daily. 10/07/22   Lewayne Bunting, MD  mupirocin ointment (BACTROBAN) 2 % Apply 1 Application topically daily. 08/14/22   [provider]  Nutritional Supplements (,FEEDING SUPPLEMENT, PROSOURCE PLUS) liquid Take 30 mLs by mouth 2 (two) times daily between meals. 02/08/20   Love, Evlyn Kanner, PA-C  nystatin (MYCOSTATIN/NYSTOP) powder Apply topically 2 (two) times daily as needed (skin irritation (under breasts)). 02/08/20   Love, Evlyn Kanner, PA-C  ondansetron (ZOFRAN) 8 MG tablet Take 1 tablet (8 mg total) by mouth every 8 (eight) hours as needed for nausea or vomiting. Patient taking differently: Take 8 mg by mouth every 8 (eight) hours as needed for nausea or vomiting. 3 tablets as needed 07/24/23   Sheliah Hatch, MD  oxyCODONE (ROXICODONE) 5 MG/5ML solution Take 4 mLs (4 mg total) by mouth every 4 (four) hours as needed for moderate pain. 02/08/20   Love, Evlyn Kanner, PA-C  pantoprazole (PROTONIX) 40 MG tablet Take 1 tablet by mouth once daily 05/29/23   Martina Sinner, MD  pantoprazole (PROTONIX) 40 MG tablet Take 1 tablet (40 mg total) by mouth daily. 05/28/23   Martina Sinner, MD  polyethylene glycol (MIRALAX / GLYCOLAX) packet Take 17 g by mouth daily as needed for mild constipation.    [provider]  potassium chloride SA (KLOR-CON M) 20 MEQ tablet Take 1 tablet (20 mEq total) by mouth daily. 07/18/22   Meriam Sprague, MD  revefenacin (YUPELRI) 175 MCG/3ML nebulizer solution Take 3 mLs (175 mcg total) by nebulization daily. 11/17/22   Martina Sinner, MD  sodium chloride (OCEAN) 0.65 % SOLN nasal spray Place 1 spray into both nostrils as needed for congestion (nose bleeds).    [provider]  valACYclovir (VALTREX) 500 MG tablet  Take 500 mg by mouth daily as needed (breakouts).  07/15/19   [provider]  VITAMIN D, CHOLECALCIFEROL, PO Take by mouth daily.    [provider]  Rayburn Ma Oil (ARTIFICIAL TEARS) ointment Place into both eyes as needed.    [provider]      Allergies    Buprenorphine, Erythromycin, Iodinated contrast media, Latex, Other, Tape, Ciprofloxacin, Duloxetine hcl, Erythromycin base, Gabapentin, Hydrocodone, Hydromorphone, Iodine, Levofloxacin, Metrizamide, Nsaids, Nucynta [tapentadol hcl], Oxycodone, Pentazocine, Pregabalin, Septra [bactrim], Sulfasalazine, Morphine, Sulfa antibiotics, Sulfamethoxazole-trimethoprim, and Tapentadol    Review of Systems   Review of Systems  Constitutional:  Negative for fever.  Eyes:  Negative for photophobia and visual disturbance.  Respiratory:  Negative for shortness of breath.   Cardiovascular:  Negative for chest pain.  Gastrointestinal:  Negative for nausea and vomiting.  Musculoskeletal:  Positive for neck  pain.  Neurological:  Positive for headaches. Negative for dizziness, weakness and numbness.  All other systems reviewed and are negative.   Physical Exam Updated Vital Signs BP (!) 161/77   Pulse 80   Temp 97.9 F (36.6 C)   Resp 17   Ht 5\' 6"  (1.676 m)   Wt 50 kg   SpO2 100%   BMI 17.79 kg/m  Physical Exam Vitals and nursing note reviewed.  Constitutional:      General: She is not in acute distress.    Appearance: She is well-developed.  HENT:     Head: Normocephalic and atraumatic.  Eyes:     Conjunctiva/sclera: Conjunctivae normal.  Cardiovascular:     Rate and Rhythm: Normal rate and regular rhythm.     Heart sounds: No murmur heard. Pulmonary:     Effort: Pulmonary effort is normal. No respiratory distress.     Breath sounds: Normal breath sounds.  Abdominal:     Palpations: Abdomen is soft.     Tenderness: There is no abdominal tenderness.  Musculoskeletal:        General: No  swelling.     Cervical back: Neck supple.  Skin:    General: Skin is warm and dry.     Capillary Refill: Capillary refill takes less than 2 seconds.  Neurological:     Mental Status: She is alert.     Comments: CN III through XII intact.  Intact finger-nose, heel-to-shin.  No pronator drift.  No slurred speech.  No facial droop.  5 out of 5 strength bilateral upper extremities.  5 out of 5 strength bilateral lower extremities.  Psychiatric:        Mood and Affect: Mood normal.     ED Results / Procedures / Treatments   Labs (all labs ordered are listed, but only abnormal results are displayed) Labs Reviewed  CBC WITH DIFFERENTIAL/PLATELET  BASIC METABOLIC PANEL  PROTIME-INR  APTT    EKG None  Radiology CT Head Wo Contrast Result Date: 09/07/2023 CLINICAL DATA:  Headache in top of head starting yesterday. EXAM: CT HEAD WITHOUT CONTRAST TECHNIQUE: Contiguous axial images were obtained from the base of the skull through the vertex without intravenous contrast. RADIATION DOSE REDUCTION: This exam was performed according to the departmental dose-optimization program which includes automated exposure control, adjustment of the mA and/or kV according to patient size and/or use of iterative reconstruction technique. COMPARISON:  CT head 08/22/2021 FINDINGS: Brain: No acute intracranial hemorrhage. No CT evidence of acute infarct. Similar appearance of remote infarct involving the right caudate and right corona radiata. Nonspecific hypoattenuation in the periventricular and subcortical white matter favored to reflect chronic microvascular ischemic changes. No edema, mass effect, or midline shift. The basilar cisterns are patent. Ventricles: The ventricles are normal. Vascular: No hyperdense vessel or unexpected calcification. Skull: No acute or aggressive finding. Orbits: Orbits are symmetric. Sinuses: Focal mucosal thickening and secretions in the right posterior ethmoid air cells. Other: Mastoid  air cells are clear. IMPRESSION: 1. No acute intracranial abnormality. 2. 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 3. Chronic microvascular ischemic changes. Paranasal sinus disease involving the right posterior ethmoid air cells, similar to prior. Electronically Signed   By: Emily Filbert M.D.   On: 09/07/2023 16:12    Procedures Procedures   Medications Ordered in ED Medications - No data to display  ED Course/ Medical Decision Making/ A&P  Medical Decision Making Amount and/or Complexity of Data Reviewed Labs: ordered. Radiology:  ordered.   78 year old female presents for evaluation briefly see HPI for further details.  On exam patient is afebrile and nontachycardic.  Her lung sounds are clear bilaterally, she is not hypoxic.  Her abdomen is soft and compressible throughout.  Her neurological examinations at baseline.  There are no focal neurodeficits.  She has no weakness of her bilateral lower extremities.  Intact finger-to-nose, heel-to-shin.  No pronator drift, no slurred speech.  Patient CBC without leukocytosis or anemia.  Patient BMP without electrolyte derangement.  APTT, PT/INR within normal limits.  CT scan of patient head shows no acute intracranial abnormality.  There is a remote infarct however this is described as being old, not acute.  Patient took Tylenol that she had in her purse while she was in the department.  She reports that her headache did decrease to 3 out of 10.  At this time, the patient will be discharged home.  She will follow-up with her PCP.  She was given return precautions and she voiced understanding.  She is stable to discharge at this time.   Final Clinical Impression(s) / ED Diagnoses Final diagnoses:  Acute nonintractable headache, unspecified headache type    Rx / DC Orders ED Discharge Orders     None         Al Decant, PA-C 09/07/23 1642    Coral Spikes, DO 09/08/23 0830

## 2023-09-07 NOTE — Discharge Instructions (Signed)
 It was a pleasure taking part in your care.  As we discussed, the CT scan of your head did not show any kind of acute abnormality.  There was noted to be a very old, remote, infarct.  This is not an acute stroke.  Please follow-up with your PCP for further management and care.  Please continue taking Tylenol at home for headaches.  Return to the ED with any new or worsening symptoms.

## 2023-09-07 NOTE — Telephone Encounter (Signed)
 New Message:     Patient says she has a headache. She said it started yesterday, and last night. She says she now has it again this morning.

## 2023-09-07 NOTE — Telephone Encounter (Signed)
 Spoke to patient she stated she had a nose bleed this past Friday 2/28.Stated she saw ENT had nose cauterized.Stated since cauterized she has a strange pain on top of head.Stated she has took Tylenol and oxycodone with some relief.Stated she continues to have pain in top of head.No slurred speech,no weakness. She has been holding Eliquis since Fri 2/28.She is going to Urgent Care to be evaluated.She wants to know when to restart Eliquis.Advised I will send message to Dr.Crenshaw.

## 2023-09-07 NOTE — ED Triage Notes (Addendum)
 States has ha  top of head  since yesterday was told to stop taking blood thinners on Friday after having her  blood vessel in her nose cauterized that day   pain is on the top of head on rt side  took OXYcondone 15 mins prior to arrival in ER

## 2023-09-07 NOTE — Discharge Instructions (Addendum)
 Advised patient to go to [facility] ED now for further evaluation of headache.  Patient is concerned with cerebral aneurysm.  Advised patient to take medication as directed with food to completion.  Encouraged to increase daily water intake to 64 ounces per day while taking this medication.  Advised we will follow-up with urine culture results once received.  Advised if symptoms worsen and/or unresolved please follow-up with your PCP or here for further evaluation.

## 2023-09-07 NOTE — ED Notes (Signed)
 Fall risk armband Fall risk sign on door Patient wearing shoes

## 2023-09-07 NOTE — ED Notes (Signed)
 Patient is being discharged from the Urgent Care and sent to the Emergency Department via POV. Per Trevor Iha FNP, patient is in need of higher level of care due to need for advanced imaging (pt wants CT of head). Patient is aware and verbalizes understanding of plan of care.  Vitals:   09/07/23 1033  BP: 136/76  Pulse: 70  Resp: 17  Temp: (!) 97.5 F (36.4 C)  SpO2: 97%

## 2023-09-07 NOTE — ED Triage Notes (Signed)
 Pt c/o intermittent HA that occurred yesterday. Took 2 tylenol around noon yesterday which helped. HA has continued to return every time the tylenol or oxycodone wears off. Denies any other sxs. Hx of neck surgeries. Has nose bleed Friday. Saw ENT, had RT side cauterized. Was told to d/c eliquis for now so hasn't taken it since Fri am.

## 2023-09-08 ENCOUNTER — Telehealth: Payer: Self-pay

## 2023-09-08 ENCOUNTER — Telehealth: Payer: Self-pay | Admitting: Cardiology

## 2023-09-08 LAB — URINE CULTURE: Culture: NO GROWTH

## 2023-09-08 NOTE — Telephone Encounter (Signed)
 Called patient to relay Dr. Rennis Golden message stating it was fine to continue medications and they are fine to take together as prescribed. Patient verbalized understanding and no other questions or concerns.

## 2023-09-08 NOTE — Telephone Encounter (Signed)
 Patient went to urgent care yesterday and was diagnosed with UTI. Patient is asking if she can take furosemide (LASIX) 40 MG tablet and 20mg  tablet and Cefdinir 300 mg twice a day for 1 week? Please advise.

## 2023-09-08 NOTE — Telephone Encounter (Signed)
 Pt c/o medication issue:  1. Name of Medication:   furosemide (LASIX) 40 MG tablet    2. How are you currently taking this medication (dosage and times per day)?   3. Are you having a reaction (difficulty breathing--STAT)?   4. What is your medication issue?   Patient states yesterday she had a bad headache and went to urgent care. She found out she has a UTI and they prescribed an antibiotic, Cefdinir. She would like to know if she can take it with Furosemide. Please advise.

## 2023-09-08 NOTE — Telephone Encounter (Signed)
 Spoke to patient she stated she went to Urgent care yesterday.She has a possible UTI.She was prescribed Cefdinir 300 mg twice a day for 7 days.She wanted to ask if ok to take with Lasix.I will send message to our Pharmacy team for advice.

## 2023-09-08 NOTE — Telephone Encounter (Signed)
 Pt called wanting to know if it was ok to take the abx rx'd yesterday for her UTI with her lasix. Per Casimiro Needle, safe to take together. Pt notified and verbalized understanding.

## 2023-09-08 NOTE — Telephone Encounter (Signed)
 Spoke to patient PharmD advised ok to take Cefdinir with Lasix.

## 2023-09-08 NOTE — Telephone Encounter (Signed)
 Pt notified ucx is normal, no growth. Per Trevor Iha, he would still like her to take 5 days of abx since in house urine test showed evidence of UTI. Pt verbalized understanding.

## 2023-09-08 NOTE — Transitions of Care (Post Inpatient/ED Visit) (Signed)
 09/08/2023  Name: [name] MRN: [id number] DOB: 11-17-1945  Today's TOC FU Call Status: Today's TOC FU Call Status:: Successful TOC FU Call Completed TOC FU Call Complete Date: 09/08/23 Patient's Name and Date of Birth confirmed.  Transition Care Management Follow-up Telephone Call Date of Discharge: 09/07/23 Discharge Facility: [facility] Type of Discharge: Emergency Department Reason for ED Visit: Other: (headache) How have you been since you were released from the hospital?: Better Any questions or concerns?: No  Items Reviewed: Did you receive and understand the discharge instructions provided?: Yes Medications obtained,verified, and reconciled?: Yes (Medications Reviewed) Any new allergies since your discharge?: No Dietary orders reviewed?: Yes Do you have support at home?: No  Medications Reviewed Today: Medications Reviewed Today     Reviewed by Karena Addison, LPN (Licensed Practical Nurse) on 09/08/23 at 1438  Med List Status: <None>   Medication Order Taking? Sig Documenting Provider Last Dose Status Informant  acetaminophen (TYLENOL) 500 MG tablet [id number] No Take 500 mg by mouth every 6 (six) hours as needed for moderate pain.  [provider] Taking Active Self  albuterol (VENTOLIN HFA) 108 (90 Base) MCG/ACT inhaler [id number] No INHALE 1 TO 2 PUFFS BY MOUTH EVERY 6 HOURS AS NEEDED Martina Sinner, MD Taking Active   apixaban (ELIQUIS) 5 MG TABS tablet [id number] No Take 1 tablet (5 mg total) by mouth 2 (two) times daily. Lewayne Bunting, MD Taking Active   Ascorbic Acid (VITAMIN C PO) [id number] No Take by mouth daily. [provider] Taking Active   b complex vitamins tablet [id number] No Take 1 tablet by mouth daily. [provider] Taking Active Self  Calcium Carbonate Antacid (TUMS CHEWY BITES PO) [id number] No Take 1 tablet by mouth daily as needed (reflux).  [provider] Taking Active Self  Calcium  Citrate-Vitamin D (CALCIUM + D PO) [id number] No Take 1 tablet by mouth daily. [provider] Taking Active   Carboxymethylcellul-Glycerin (LUBRICATING EYE DROPS OP) [id number] No Place 1 drop into both eyes daily as needed (dry eyes). [provider] Taking Active Self  cefdinir (OMNICEF) 300 MG capsule [id number]  Take 1 capsule (300 mg total) by mouth 2 (two) times daily for 7 days. Trevor Iha, FNP  Active   cephALEXin (KEFLEX) 500 MG capsule [id number] No Take 2 grams (4 tablets) by mouth 1 hour prior to dental cleaning/procedure. Meriam Sprague, MD Taking Active   clonazePAM Scarlette Calico) 0.5 MG tablet [id number] No Take 1 tablet by mouth twice daily as needed for anxiety Sheliah Hatch, MD Taking Active   cyanocobalamin (VITAMIN B12) 1000 MCG/ML injection [id number] No Inject 1,000 mcg into the muscle every 30 (thirty) days. [provider] Taking Active   denosumab (PROLIA) 60 MG/ML SOSY injection [id number] No Inject 60 mg into the skin every 6 (six) months. [provider] Taking Active Self           Med Note Vickey Sages Jun 11, 2020  2:33 PM)    doxepin (SINEQUAN) 10 MG capsule [id number] No Take 10 mg by mouth at bedtime.  Patient not taking: Reported on 07/07/2023   [provider] Not Taking Active   DULoxetine (CYMBALTA) 60 MG capsule [id number] No Take 60 mg by mouth daily.  Patient not taking: Reported on 07/07/2023   [provider] Not Taking Active   furosemide (LASIX) 20 MG tablet [id number] No Take 1 tablet (20 mg total) by mouth every other day. Lyman Bishop,  Bettey Mare, NP Taking Expired 08/10/23 2359   furosemide (LASIX) 40 MG tablet [id number] No Take 1.5 tablets (60 mg total) by mouth as directed. Take 1.5 tablet by mouth on Tuesday, Thursday, and Saturday. Jodelle Gross, NP Taking Active   levothyroxine (SYNTHROID) 75 MCG tablet [id number] No Take 1 tablet (75 mcg total) by mouth daily. Sheliah Hatch, MD Taking Active   meclizine (ANTIVERT) 25 MG tablet [id number]  TAKE 1 TABLET BY MOUTH THREE TIMES DAILY AS NEEDED FOR DIZZINESS Sheliah Hatch, MD  Active   metoprolol tartrate (LOPRESSOR) 25 MG tablet [id number] No Take 1 tablet (25 mg total) by mouth 2 (two) times daily. Lewayne Bunting, MD Taking Active   mupirocin ointment (BACTROBAN) 2 % [id number] No Apply 1 Application topically daily. [provider] Taking Active   Nutritional Supplements (,FEEDING SUPPLEMENT, PROSOURCE PLUS) liquid [id number] No Take 30 mLs by mouth 2 (two) times daily between meals. Jacquelynn Cree, PA-C Taking Active   nystatin (MYCOSTATIN/NYSTOP) powder [id number] No Apply topically 2 (two) times daily as needed (skin irritation (under breasts)). Jacquelynn Cree, PA-C Taking Active   ondansetron (ZOFRAN) 8 MG tablet [id number] No Take 1 tablet (8 mg total) by mouth every 8 (eight) hours as needed for nausea or vomiting.  Patient taking differently: Take 8 mg by mouth every 8 (eight) hours as needed for nausea or vomiting. 3 tablets as needed   Sheliah Hatch, MD Taking Active   oxyCODONE (ROXICODONE) 5 MG/5ML solution [id number] No Take 4 mLs (4 mg total) by mouth every 4 (four) hours as needed for moderate pain. Jacquelynn Cree, PA-C Taking Active            Med Note Vickey Sages Jun 11, 2020  2:33 PM)    pantoprazole (PROTONIX) 40 MG tablet [id number] No Take 1 tablet by mouth once daily Martina Sinner, MD Taking Active   pantoprazole (PROTONIX) 40 MG tablet [id number] No Take 1 tablet (40 mg total) by mouth daily. Martina Sinner, MD Taking Active   polyethylene glycol [facility] / GLYCOLAX) packet [id number] No Take 17 g by mouth daily as needed for mild constipation. [provider] Taking Active Self  potassium chloride SA (KLOR-CON M) 20 MEQ tablet [id number] No Take 1 tablet (20 mEq total) by mouth daily. Meriam Sprague, MD Taking Active   revefenacin [facility]) 175  MCG/3ML nebulizer solution [id number] No Take 3 mLs (175 mcg total) by nebulization daily. Martina Sinner, MD Taking Active   sodium chloride (OCEAN) 0.65 % SOLN nasal spray [id number] No Place 1 spray into both nostrils as needed for congestion (nose bleeds). [provider] Taking Active Self  valACYclovir (VALTREX) 500 MG tablet [id number] No Take 500 mg by mouth daily as needed (breakouts).  [provider] Taking Active Self  VITAMIN D, CHOLECALCIFEROL, PO [id number] No Take by mouth daily. [provider] Taking Active   White Petrolatum-Mineral Oil (ARTIFICIAL TEARS) ointment [id number] No Place into both eyes as needed. [provider] Taking Active             Home Care and Equipment/Supplies: Were Home Health Services Ordered?: NA Any new equipment or medical supplies ordered?: NA  Functional Questionnaire: Do you need assistance with bathing/showering or dressing?: No Do you need assistance with meal preparation?: No Do you need assistance with eating?: No Do you have difficulty maintaining continence: No Do you need assistance with getting out of bed/getting out  of a chair/moving?: No Do you have difficulty managing or taking your medications?: No  Follow up appointments reviewed: PCP Follow-up appointment confirmed?: Yes Date of PCP follow-up appointment?: 09/14/23 Follow-up Provider: [facility] Follow-up appointment confirmed?: NA Do you need transportation to your follow-up appointment?: No Do you understand care options if your condition(s) worsen?: Yes-patient verbalized understanding    SIGNATURE Karena Addison, LPN [facility] Nurse Health Advisor Direct Dial [phone]

## 2023-09-08 NOTE — Telephone Encounter (Signed)
 It is fine to take those medications together as directed

## 2023-09-09 DIAGNOSIS — I480 Paroxysmal atrial fibrillation: Secondary | ICD-10-CM | POA: Diagnosis not present

## 2023-09-09 DIAGNOSIS — Z7901 Long term (current) use of anticoagulants: Secondary | ICD-10-CM | POA: Diagnosis not present

## 2023-09-09 DIAGNOSIS — I4891 Unspecified atrial fibrillation: Secondary | ICD-10-CM | POA: Diagnosis not present

## 2023-09-09 DIAGNOSIS — R519 Headache, unspecified: Secondary | ICD-10-CM | POA: Diagnosis not present

## 2023-09-10 ENCOUNTER — Telehealth: Payer: Self-pay

## 2023-09-10 NOTE — Transitions of Care (Post Inpatient/ED Visit) (Signed)
 09/10/2023  Name: [name] MRN: [id number] DOB: 28-Aug-1945  Today's TOC FU Call Status: Today's TOC FU Call Status:: Successful TOC FU Call Completed TOC FU Call Complete Date: 09/10/23 Patient's Name and Date of Birth confirmed.  Transition Care Management Follow-up Telephone Call Date of Discharge: 09/09/23 Discharge Facility: Other ([facility]) Name of Other ([facility]) Discharge Facility: [facility] Type of Discharge: Emergency Department Reason for ED Visit: Other: (headache) How have you been since you were released from the hospital?: Same  Items Reviewed: Did you receive and understand the discharge instructions provided?: Yes Medications obtained,verified, and reconciled?: Yes (Medications Reviewed) Any new allergies since your discharge?: No Dietary orders reviewed?: Yes Do you have support at home?: No  Medications Reviewed Today: Medications Reviewed Today     Reviewed by Karena Addison, LPN (Licensed Practical Nurse) on 09/10/23 at 1445  Med List Status: <None>   Medication Order Taking? Sig Documenting Provider Last Dose Status Informant  acetaminophen (TYLENOL) 500 MG tablet [id number] No Take 500 mg by mouth every 6 (six) hours as needed for moderate pain.  [provider] Taking Active Self  albuterol (VENTOLIN HFA) 108 (90 Base) MCG/ACT inhaler [id number] No INHALE 1 TO 2 PUFFS BY MOUTH EVERY 6 HOURS AS NEEDED Martina Sinner, MD Taking Active   apixaban (ELIQUIS) 5 MG TABS tablet [id number] No Take 1 tablet (5 mg total) by mouth 2 (two) times daily. Lewayne Bunting, MD Taking Active   Ascorbic Acid (VITAMIN C PO) [id number] No Take by mouth daily. [provider] Taking Active   b complex vitamins tablet [id number] No Take 1 tablet by mouth daily. [provider] Taking Active Self  Calcium Carbonate Antacid (TUMS CHEWY BITES PO) [id number] No Take 1 tablet by mouth daily as needed (reflux).  [provider] Taking Active  Self  Calcium Citrate-Vitamin D (CALCIUM + D PO) [id number] No Take 1 tablet by mouth daily. [provider] Taking Active   Carboxymethylcellul-Glycerin (LUBRICATING EYE DROPS OP) [id number] No Place 1 drop into both eyes daily as needed (dry eyes). [provider] Taking Active Self  cefdinir (OMNICEF) 300 MG capsule [id number]  Take 1 capsule (300 mg total) by mouth 2 (two) times daily for 7 days. Trevor Iha, FNP  Active   cephALEXin (KEFLEX) 500 MG capsule [id number] No Take 2 grams (4 tablets) by mouth 1 hour prior to dental cleaning/procedure. Meriam Sprague, MD Taking Active   clonazePAM Scarlette Calico) 0.5 MG tablet [id number] No Take 1 tablet by mouth twice daily as needed for anxiety Sheliah Hatch, MD Taking Active   cyanocobalamin (VITAMIN B12) 1000 MCG/ML injection [id number] No Inject 1,000 mcg into the muscle every 30 (thirty) days. [provider] Taking Active   denosumab (PROLIA) 60 MG/ML SOSY injection [id number] No Inject 60 mg into the skin every 6 (six) months. [provider] Taking Active Self           Med Note Vickey Sages Jun 11, 2020  2:33 PM)    doxepin (SINEQUAN) 10 MG capsule [id number] No Take 10 mg by mouth at bedtime.  Patient not taking: Reported on 07/07/2023   [provider] Not Taking Active   DULoxetine (CYMBALTA) 60 MG capsule [id number] No Take 60 mg by mouth daily.  Patient not taking: Reported on 07/07/2023   [provider] Not Taking Active   furosemide (LASIX) 20 MG tablet [id number] No Take 1 tablet (20 mg total) by mouth every other  day. Jodelle Gross, NP Taking Expired 08/10/23 2359   furosemide (LASIX) 40 MG tablet [id number] No Take 1.5 tablets (60 mg total) by mouth as directed. Take 1.5 tablet by mouth on Tuesday, Thursday, and Saturday. Jodelle Gross, NP Taking Active   levothyroxine (SYNTHROID) 75 MCG tablet [id number] No Take 1 tablet (75 mcg total) by mouth daily.  Sheliah Hatch, MD Taking Active   meclizine (ANTIVERT) 25 MG tablet [id number]  TAKE 1 TABLET BY MOUTH THREE TIMES DAILY AS NEEDED FOR DIZZINESS Sheliah Hatch, MD  Active   metoprolol tartrate (LOPRESSOR) 25 MG tablet [id number] No Take 1 tablet (25 mg total) by mouth 2 (two) times daily. Lewayne Bunting, MD Taking Active   mupirocin ointment (BACTROBAN) 2 % [id number] No Apply 1 Application topically daily. [provider] Taking Active   Nutritional Supplements (,FEEDING SUPPLEMENT, PROSOURCE PLUS) liquid [id number] No Take 30 mLs by mouth 2 (two) times daily between meals. Jacquelynn Cree, PA-C Taking Active   nystatin (MYCOSTATIN/NYSTOP) powder [id number] No Apply topically 2 (two) times daily as needed (skin irritation (under breasts)). Jacquelynn Cree, PA-C Taking Active   ondansetron (ZOFRAN) 8 MG tablet [id number] No Take 1 tablet (8 mg total) by mouth every 8 (eight) hours as needed for nausea or vomiting.  Patient taking differently: Take 8 mg by mouth every 8 (eight) hours as needed for nausea or vomiting. 3 tablets as needed   Sheliah Hatch, MD Taking Active   oxyCODONE (ROXICODONE) 5 MG/5ML solution [id number] No Take 4 mLs (4 mg total) by mouth every 4 (four) hours as needed for moderate pain. Jacquelynn Cree, PA-C Taking Active            Med Note Vickey Sages Jun 11, 2020  2:33 PM)    pantoprazole (PROTONIX) 40 MG tablet [id number] No Take 1 tablet by mouth once daily Martina Sinner, MD Taking Active   pantoprazole (PROTONIX) 40 MG tablet [id number] No Take 1 tablet (40 mg total) by mouth daily. Martina Sinner, MD Taking Active   polyethylene glycol [facility] / GLYCOLAX) packet [id number] No Take 17 g by mouth daily as needed for mild constipation. [provider] Taking Active Self  potassium chloride SA (KLOR-CON M) 20 MEQ tablet [id number] No Take 1 tablet (20 mEq total) by mouth daily. Meriam Sprague, MD Taking Active   revefenacin  [facility]) 175 MCG/3ML nebulizer solution [id number] No Take 3 mLs (175 mcg total) by nebulization daily. Martina Sinner, MD Taking Active   sodium chloride (OCEAN) 0.65 % SOLN nasal spray [id number] No Place 1 spray into both nostrils as needed for congestion (nose bleeds). [provider] Taking Active Self  valACYclovir (VALTREX) 500 MG tablet [id number] No Take 500 mg by mouth daily as needed (breakouts).  [provider] Taking Active Self  VITAMIN D, CHOLECALCIFEROL, PO [id number] No Take by mouth daily. [provider] Taking Active   White Petrolatum-Mineral Oil (ARTIFICIAL TEARS) ointment [id number] No Place into both eyes as needed. [provider] Taking Active             Home Care and Equipment/Supplies: Were Home Health Services Ordered?: NA Any new equipment or medical supplies ordered?: NA  Functional Questionnaire: Do you need assistance with bathing/showering or dressing?: No Do you need assistance with meal preparation?: No Do you need assistance with eating?: No Do you have difficulty maintaining continence: No Do you need assistance with getting out of  bed/getting out of a chair/moving?: No Do you have difficulty managing or taking your medications?: No  Follow up appointments reviewed: PCP Follow-up appointment confirmed?: Yes Date of PCP follow-up appointment?: 09/14/23 Follow-up Provider: [facility] Follow-up appointment confirmed?: NA Do you need transportation to your follow-up appointment?: No Do you understand care options if your condition(s) worsen?: Yes-patient verbalized understanding    SIGNATURE Karena Addison, LPN [facility] Nurse Health Advisor Direct Dial [phone]

## 2023-09-11 ENCOUNTER — Ambulatory Visit: Payer: Medicare Other | Admitting: Podiatry

## 2023-09-11 NOTE — Progress Notes (Signed)
 HPI: Follow-up chronic dyspnea, previous mitral valve repair, paroxysmal atrial fibrillation.  Patient underwent minimally invasive mitral valve repair, complete maze procedure, clipping of left atrial appendage and sternotomy with repair of left ventricular free wall laceration July 2021.  Note preoperative catheterization May 2021 showed 20% right coronary artery and 20% distal LAD.  She has had difficulties with continuing dyspnea and chest pain. Chest CT August 2023 showed right middle lobe and left upper lobe nodularity suggestive of atypical infection, nodule in the left major fissure.  ABIs May 2024 normal.  Echocardiogram July 2024 showed normal LV function, moderate RV dysfunction, moderate right ventricular enlargement, moderate pulm hypertension, moderate right atrial enlargement, status post mitral valve repair with mean gradient 2 mmHg and trace mitral regurgitation, moderate tricuspid regurgitation, mild aortic insufficiency and moderate pulmonic insufficiency.  She has also been diagnosed with obstructive sleep apnea and is now on CPAP.  Recently seen at [facility] and by report was in recurrent atrial fibrillation.  Also with recurrent nosebleeds.  Since last seen patient continues to complain of dyspnea on exertion which is unchanged.  No orthopnea, PND, pedal edema, chest pain or syncope.  She does have palpitations.  Current Outpatient Medications  Medication Sig Dispense Refill   acetaminophen (TYLENOL) 500 MG tablet Take 500 mg by mouth every 6 (six) hours as needed for moderate pain.      albuterol (VENTOLIN HFA) 108 (90 Base) MCG/ACT inhaler Inhale 1-2 puffs into the lungs every 6 (six) hours as needed for wheezing or shortness of breath. 9 g 0   apixaban (ELIQUIS) 5 MG TABS tablet Take 1 tablet (5 mg total) by mouth 2 (two) times daily. 180 tablet 1   Ascorbic Acid (VITAMIN C PO) Take by mouth daily.     b complex vitamins tablet Take 1 tablet by mouth daily.     Calcium  Carbonate Antacid (TUMS CHEWY BITES PO) Take 1 tablet by mouth daily as needed (reflux).      Calcium Citrate-Vitamin D (CALCIUM + D PO) Take 1 tablet by mouth daily.     Carboxymethylcellul-Glycerin (LUBRICATING EYE DROPS OP) Place 1 drop into both eyes daily as needed (dry eyes).     cephALEXin (KEFLEX) 500 MG capsule Take 2 grams (4 tablets) by mouth 1 hour prior to dental cleaning/procedure. 4 capsule 2   clonazePAM (KLONOPIN) 0.5 MG tablet Take 1 tablet by mouth twice daily as needed for anxiety 30 tablet 3   cyanocobalamin (VITAMIN B12) 1000 MCG/ML injection Inject 1,000 mcg into the muscle every 30 (thirty) days.     denosumab (PROLIA) 60 MG/ML SOSY injection Inject 60 mg into the skin every 6 (six) months.     doxepin (SINEQUAN) 10 MG capsule Take 10 mg by mouth at bedtime.     DULoxetine (CYMBALTA) 60 MG capsule Take 60 mg by mouth daily.     furosemide (LASIX) 40 MG tablet Take 1.5 tablets (60 mg total) by mouth as directed. Take 1.5 tablet by mouth on Tuesday, Thursday, and Saturday. 90 tablet 3   levothyroxine (SYNTHROID) 75 MCG tablet Take 1 tablet (75 mcg total) by mouth daily. 90 tablet 0   meclizine (ANTIVERT) 25 MG tablet TAKE 1 TABLET BY MOUTH THREE TIMES DAILY AS NEEDED FOR DIZZINESS 45 tablet 0   metoprolol tartrate (LOPRESSOR) 25 MG tablet Take 1 tablet (25 mg total) by mouth 2 (two) times daily. 180 tablet 3   mupirocin ointment (BACTROBAN) 2 % Apply 1 Application topically daily.  Nutritional Supplements (,FEEDING SUPPLEMENT, PROSOURCE PLUS) liquid Take 30 mLs by mouth 2 (two) times daily between meals. 887 mL 0   nystatin (MYCOSTATIN/NYSTOP) powder Apply topically 2 (two) times daily as needed (skin irritation (under breasts)). 15 g 0   ondansetron (ZOFRAN) 8 MG tablet Take 1 tablet (8 mg total) by mouth every 8 (eight) hours as needed for nausea or vomiting. (Patient taking differently: Take 8 mg by mouth every 8 (eight) hours as needed for nausea or vomiting. 3 tablets as  needed) 45 tablet 1   oxyCODONE (ROXICODONE) 5 MG/5ML solution Take 4 mLs (4 mg total) by mouth every 4 (four) hours as needed for moderate pain.  0   pantoprazole (PROTONIX) 40 MG tablet Take 1 tablet by mouth once daily 30 tablet 3   pantoprazole (PROTONIX) 40 MG tablet Take 1 tablet (40 mg total) by mouth daily. 30 tablet 5   polyethylene glycol (MIRALAX / GLYCOLAX) packet Take 17 g by mouth daily as needed for mild constipation.     potassium chloride SA (KLOR-CON M) 20 MEQ tablet Take 1 tablet (20 mEq total) by mouth daily. 90 tablet 3   revefenacin (YUPELRI) 175 MCG/3ML nebulizer solution Take 3 mLs (175 mcg total) by nebulization daily. 90 mL 11   sodium chloride (OCEAN) 0.65 % SOLN nasal spray Place 1 spray into both nostrils as needed for congestion (nose bleeds).     valACYclovir (VALTREX) 500 MG tablet Take 500 mg by mouth daily as needed (breakouts).      VITAMIN D, CHOLECALCIFEROL, PO Take by mouth daily.     White Petrolatum-Mineral Oil (ARTIFICIAL TEARS) ointment Place into both eyes as needed.     furosemide (LASIX) 20 MG tablet Take 1 tablet (20 mg total) by mouth every other day. 90 tablet 3   No current facility-administered medications for this visit.     Past Medical History:  Diagnosis Date   Allergies    Anxiety    Arthritis    "maybe in my back" (03/31/2018)   Benign paroxysmal positional vertigo 06/08/2013   Complication of anesthesia    Fracture of multiple ribs 2015   "don't know from what; dx'd when I in hospital for 1st back OR" (03/31/2018)   GERD (gastroesophageal reflux disease)    Hair loss 04/12/2012   Herpes    History of blood transfusion    "twice; related to back OR" (03/31/2018)   History of kidney stones    Interstitial cystitis 11/06/2011   Melanoma of ankle (HCC) ~ 2003   "right"   Mitral regurgitation    Osteopenia 02/18/2012   Osteoporosis    PAF (paroxysmal atrial fibrillation) (HCC) 2012   Peripheral neuropathy 11/06/2011   PMDD  (premenstrual dysphoric disorder)    PONV (postoperative nausea and vomiting)    nausea, vomiting, hives and dizziness    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/2020   Complete bilateral atrial lesion set using cryothermy and bipolar radiofrequency ablation with clipping of LA appendage via right mini-thoracotomy approach   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   Complex valvuloplasty including artificial Gore-tex neochord placement x12 with 32mm Sorin Memo 4D ring annuloplasty   Seasonal allergies    Vaginal delivery    ONE NSVD   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    Past Surgical History:  Procedure Laterality Date   ANTERIOR CERVICAL DECOMP/DISCECTOMY FUSION  ~ 2003   BACK SURGERY     BREAST SURGERY     BREAST BIOPSY--RIGHT BENIGN   BUNIONECTOMY Bilateral  CARDIOVERSION N/A 01/26/2020   Procedure: CARDIOVERSION;  Surgeon: Sande Rives, MD;  Location: [facility] ENDOSCOPY;  Service: Cardiovascular;  Laterality: N/A;   COSMETIC SURGERY  2016   "back of my neck; related to earlier fusion"   CYSTOSCOPY W/ STONE MANIPULATION  "several times"   DILATION AND CURETTAGE OF UTERUS     FOREHEAD RECONSTRUCTION Right    "removed bone protruding out of my forehead"   HARDWARE REMOVAL  2016   "related to neck OR"   INCONTINENCE SURGERY     IR THORACENTESIS ASP PLEURAL SPACE W/IMG GUIDE  02/16/2020   MINIMALLY INVASIVE MAZE PROCEDURE N/A 01/17/2020   Procedure: MINIMALLY INVASIVE MAZE PROCEDURE;  Surgeon: Purcell Nails, MD;  Location: [facility] OR;  Service: Open Heart Surgery;  Laterality: N/A;   MITRAL VALVE REPAIR Right 01/17/2020   Procedure: MINIMALLY INVASIVE MITRAL VALVE REPAIR (MVR) USING MEMO 4D ;  Surgeon: Purcell Nails, MD;  Location: [facility] OR;  Service: Open Heart Surgery;  Laterality: Right;   POSTERIOR CERVICAL FUSION/FORAMINOTOMY  ~ 2008; 2015   RIGHT/LEFT HEART CATH AND CORONARY ANGIOGRAPHY N/A 12/02/2019   Procedure: RIGHT/LEFT HEART CATH AND CORONARY ANGIOGRAPHY;  Surgeon:  Kathleene Hazel, MD;  Location: [facility] INVASIVE CV LAB;  Service: Cardiovascular;  Laterality: N/A;   SHOULDER ARTHROSCOPY W/ ROTATOR CUFF REPAIR Right 2012   SPINAL FUSION  06/2014 - 2018 X ?7   "scoliosis; my entire back"   TEE WITHOUT CARDIOVERSION N/A 11/21/2019   Procedure: TRANSESOPHAGEAL ECHOCARDIOGRAM (TEE);  Surgeon: Wendall Stade, MD;  Location: [facility] ENDOSCOPY;  Service: Cardiovascular;  Laterality: N/A;   TEE WITHOUT CARDIOVERSION N/A 01/17/2020   Procedure: TRANSESOPHAGEAL ECHOCARDIOGRAM (TEE);  Surgeon: Purcell Nails, MD;  Location: [facility] OR;  Service: Open Heart Surgery;  Laterality: N/A;   TEE WITHOUT CARDIOVERSION N/A 01/26/2020   Procedure: TRANSESOPHAGEAL ECHOCARDIOGRAM (TEE);  Surgeon: Sande Rives, MD;  Location: [facility] ENDOSCOPY;  Service: Cardiovascular;  Laterality: N/A;   TUBAL LIGATION     VAGINAL HYSTERECTOMY     TVH    Social History   Socioeconomic History   Marital status: Divorced    Spouse name: none/divorced   Number of children: Not on file   Years of education: Not on file   Highest education level: 12th grade  Occupational History   Occupation: retired  Tobacco Use   Smoking status: Former    Current packs/day: 0.00    Average packs/day: 0.1 packs/day for 14.0 years (1.4 ttl pk-yrs)    Types: Cigarettes    Start date: 1966    Quit date: 1980    Years since quitting: 45.2    Passive exposure: Past   Smokeless tobacco: Never   Tobacco comments:    03/31/2018 "quit ~ 1980; someday smoker when I did smoke; never addicted"  Vaping Use   Vaping status: Never Used  Substance and Sexual Activity   Alcohol use: Never   Drug use: Yes    Types: Oxycodone, Benzodiazepines    Comment: 03/31/2018 "for chronic neck and back pain", takes Klonopin at times.    Sexual activity: Not Currently  Other Topics Concern   Not on file  Social History Narrative   Lives by herself.    Social Drivers of Health   Financial Resource Strain: Low Risk   (10/23/2022)   Overall Financial Resource Strain (CARDIA)    Difficulty of Paying Living Expenses: Not hard at all  Food Insecurity: No Food Insecurity (10/23/2022)   Hunger Vital Sign  Worried About Programme researcher, broadcasting/film/video in the Last Year: Never true    Ran Out of Food in the Last Year: Never true  Transportation Needs: No Transportation Needs (10/23/2022)   PRAPARE - Administrator, Civil Service (Medical): No    Lack of Transportation (Non-Medical): No  Physical Activity: Inactive (10/23/2022)   Exercise Vital Sign    Days of Exercise per Week: 0 days    Minutes of Exercise per Session: 0 min  Stress: Stress Concern Present (10/23/2022)   [employer] of Occupational Health - Occupational Stress Questionnaire    Feeling of Stress : To some extent  Social Connections: Socially Isolated (10/23/2022)   Social Connection and Isolation Panel [NHANES]    Frequency of Communication with Friends and Family: More than three times a week    Frequency of Social Gatherings with Friends and Family: Once a week    Attends Religious Services: Never    Database administrator or Organizations: No    Attends Banker Meetings: Never    Marital Status: Divorced  Catering manager Violence: Not At Risk (09/16/2023)   Received from [facility]   HITS    Over the last 12 months how often did your partner physically hurt you?: Never    Over the last 12 months how often did your partner insult you or talk down to you?: Never    Over the last 12 months how often did your partner threaten you with physical harm?: Never    Over the last 12 months how often did your partner scream or curse at you?: Never    Family History  Problem Relation Age of Onset   Diabetes Father    Hyperlipidemia Sister    Heart disease Sister    Stroke Sister    Diabetes Brother    Hyperlipidemia Sister    Heart disease Sister    Arthritis Mother    Heart disease Mother    Uterine cancer Mother     Diabetes Brother    Heart Problems Brother     ROS: no fevers or chills, productive cough, hemoptysis, dysphasia, odynophagia, melena, hematochezia, dysuria, hematuria, rash, seizure activity, orthopnea, PND, pedal edema, claudication. Remaining systems are negative.  Physical Exam: Well-developed frail in no acute distress.  Skin is warm and dry.  HEENT is normal.  Neck is supple.  Chest is clear to auscultation with normal expansion.  Cardiovascular exam is irregular Abdominal exam nontender or distended. No masses palpated. Extremities show no edema. neuro grossly intact  EKG Interpretation Date/Time:  Wednesday September 23 2023 14:06:16 EDT Ventricular Rate:  106 PR Interval:    QRS Duration:  144 QT Interval:  360 QTC Calculation: 478 R Axis:   95  Text Interpretation: Atrial fibrillation with rapid ventricular response Confirmed by Olga Millers ([id number]) on 09/23/2023 2:08:09 PM    A/P  1 paroxysmal atrial fibrillation-patient is back in atrial fibrillation today.  Heart rate is mildly elevated and blood pressure has been borderline as an outpatient.  Continue metoprolol.  I would like to try and maintain sinus rhythm.  I will begin amiodarone at 200 mg twice daily for 2 weeks then 200 mg daily thereafter.  This should assist with rate control and we will plan to proceed with cardioversion in approximately 4 weeks if atrial fibrillation persist.  She has had difficulties with recurrent nosebleeds but none in the past 3 weeks.  Increase apixaban to 5 mg twice daily in anticipation  of cardioversion.  Will likely discontinue 4 weeks after cardioversion.  It should also be noted that at time of her mitral valve repair she had Maze procedure with left atrial appendage clipping.   2 status post mitral valve repair-continue SBE prophylaxis.  Most recent echocardiogram showed no significant mitral stenosis and trace mitral regurgitation.  3 history of chest pain-symptoms are chronic and  unchanged.  4 hypertension-blood pressure is controlled.  Continue present medical regimen.  5 history of nonobstructive coronary disease-will consider adding aspirin when apixaban is discontinued in the future.  Previously declined statins.  6 chronic dyspnea-symptoms persist.  She is not volume overloaded on examination.  Continue diuretic at present dose.  7 obstructive sleep apnea-follow-up pulmonary.  8 severe anxiety-severely anxious in the office today.  Follow-up primary care.  Olga Millers, MD

## 2023-09-14 ENCOUNTER — Ambulatory Visit (INDEPENDENT_AMBULATORY_CARE_PROVIDER_SITE_OTHER): Admitting: Family Medicine

## 2023-09-14 ENCOUNTER — Encounter: Payer: Self-pay | Admitting: Family Medicine

## 2023-09-14 VITALS — BP 98/52 | HR 75 | Temp 97.8°F | Ht 66.0 in | Wt 106.0 lb

## 2023-09-14 DIAGNOSIS — R519 Headache, unspecified: Secondary | ICD-10-CM

## 2023-09-14 DIAGNOSIS — R040 Epistaxis: Secondary | ICD-10-CM | POA: Diagnosis not present

## 2023-09-14 DIAGNOSIS — R42 Dizziness and giddiness: Secondary | ICD-10-CM

## 2023-09-14 NOTE — Patient Instructions (Signed)
 Follow up as needed or as scheduled DECREASE your Metoprolol to 1/2 tab twice daily (12.5mg  twice daily) DECREASE the Eliquis to 1/2 tab twice daily (2.5mg  twice daily) Make sure you are drinking water and eating regularly to help with your headaches CONTINUE the Claritin daily to help w/ allergy congestion and headaches Call with any questions or concerns Hang in there!!

## 2023-09-14 NOTE — Progress Notes (Signed)
   Subjective:    Patient ID: [name], female    DOB: 1945-12-21, 79 y.o.   MRN: [id number]  HPI ER f/u- pt went to ER on 3/5 for HA.  She woke w/ a HA at the top of her head that radiated down the R side of her face.  Pain was 10/10.  Prior to that, on 3/2 she reports she woke w/ 'the worst HA of her life' but that improved w/ Tylenol and Oxycodone.  She was seen in the ER on 3/3 for that HA and had a normal head CT.  On 3/5- CBC and CMP were normal, brain MRI w/o acute findings.  She was told to take her routine pain medications and was d/c'd home.  Pt reports she continues to have daily HA but it is not as severe.  Pt just recently started taking Claritin but has not been consistent.  Continues to have ongoing dizziness and nosebleeds- both are very concerning to her.  Neuro was concerned about her low BP causing her dizziness.   Review of Systems For ROS see HPI     Objective:   Physical Exam Vitals reviewed.  Constitutional:      General: She is not in acute distress.    Appearance: She is ill-appearing (frail).  HENT:     Head: Normocephalic and atraumatic.  Eyes:     Extraocular Movements: Extraocular movements intact.     Conjunctiva/sclera: Conjunctivae normal.  Cardiovascular:     Rate and Rhythm: Normal rate and regular rhythm.  Pulmonary:     Effort: Pulmonary effort is normal. No respiratory distress.  Skin:    General: Skin is warm and dry.  Neurological:     Mental Status: She is alert and oriented to person, place, and time. Mental status is at baseline.  Psychiatric:     Comments: Very anxious           Assessment & Plan:  Dizziness- ongoing issue.  Will decrease Metoprolol to 1/2 tab BID (12.5mg  BID) due to ongoing dizziness.  Neuro was concerned that hypotension was playing a role.  Given her known heart issues, will not stop beta blocker entirely, but encouraged her to discuss her sxs w/ Cards at upcoming appt.  Pt expressed understanding and is in  agreement w/ plan.   Frequent Nosebleeds- deteriorated.  Pt has had multiple ER visits for this.  Given that she is nearly 85 and she is having recurrent nosebleeds, will decrease Eliquis to 1/2 tab BID (2.5mg  BID.  Pt expressed understanding and is in agreement w/ plan.   HA- ongoing issue for pt.  She has had both CT and MRI that were unrevealing.  Given the time of year, will start daily antihistamine in case there is an allergy component to HA's.  Pt expressed understanding and is in agreement w/ plan.

## 2023-09-15 ENCOUNTER — Telehealth: Payer: Self-pay | Admitting: Pulmonary Disease

## 2023-09-15 ENCOUNTER — Other Ambulatory Visit: Payer: Self-pay

## 2023-09-15 MED ORDER — ALBUTEROL SULFATE HFA 108 (90 BASE) MCG/ACT IN AERS: 1.0000 | INHALATION_SPRAY | Freq: Four times a day (QID) | RESPIRATORY_TRACT | 0 refills | Status: DC | PRN

## 2023-09-15 NOTE — Telephone Encounter (Signed)
 This has been resent with the dx code to the pharmacy.

## 2023-09-15 NOTE — Telephone Encounter (Signed)
 Patient is on medicare part D and is on an albuterol inhaler. [employer] needs to know why the patient is on the drug such as her diagnoses before they will cover it. [employer] is waiting to hear back from the provider [phone]

## 2023-09-16 ENCOUNTER — Telehealth: Payer: Self-pay | Admitting: Cardiology

## 2023-09-16 ENCOUNTER — Ambulatory Visit: Payer: Self-pay | Admitting: Family Medicine

## 2023-09-16 DIAGNOSIS — J984 Other disorders of lung: Secondary | ICD-10-CM | POA: Diagnosis not present

## 2023-09-16 DIAGNOSIS — Z7901 Long term (current) use of anticoagulants: Secondary | ICD-10-CM | POA: Diagnosis not present

## 2023-09-16 DIAGNOSIS — I517 Cardiomegaly: Secondary | ICD-10-CM | POA: Diagnosis not present

## 2023-09-16 DIAGNOSIS — R9431 Abnormal electrocardiogram [ECG] [EKG]: Secondary | ICD-10-CM | POA: Diagnosis not present

## 2023-09-16 DIAGNOSIS — Z79891 Long term (current) use of opiate analgesic: Secondary | ICD-10-CM | POA: Diagnosis not present

## 2023-09-16 DIAGNOSIS — I482 Chronic atrial fibrillation, unspecified: Secondary | ICD-10-CM | POA: Diagnosis not present

## 2023-09-16 DIAGNOSIS — Z87891 Personal history of nicotine dependence: Secondary | ICD-10-CM | POA: Diagnosis not present

## 2023-09-16 DIAGNOSIS — R079 Chest pain, unspecified: Secondary | ICD-10-CM | POA: Diagnosis not present

## 2023-09-16 DIAGNOSIS — R002 Palpitations: Secondary | ICD-10-CM | POA: Diagnosis not present

## 2023-09-16 NOTE — Telephone Encounter (Signed)
 FYI

## 2023-09-16 NOTE — Telephone Encounter (Signed)
 Spoke to pt over the phone. Pt verified last name and DOB. Patient stated she went to PCP Dr. Beverely Low on Monday 09/14/2023 and her medications were changed. Her metoprolol tartrate was decreased from 25 mg BID to 12.5 mg BID and Eliquis was decreased from 5 mg BID to 2.5 mg BID. Patient stated she feels like she is in Afib due to her heart feeling like it is "quivering". I asked about symptoms, patient denied chest pain, dizziness, headache, and shortness of breath. Patient stated her blood pressure was 125/80 this morning but have been trending: 134/91 HR 129 127/89 HR 121 133/90 HR 115 Since Monday.  Patient stated she took a whole tablet of metoprolol this morning (25 mg) but has been taking Eliquis 2.5 mg BID since Monday night. I recommended patient to continue to monitor BP and HR and to go to ER for evaluation if symptoms arise. Patient wants advise about continuing medication as prescribed by PCP or go back to original dose prescribed by cardiologist. Informed patient I would notify Dr. Jens Som to advise. Patient has scheduled follow up appt with Dr. Jens Som on 09/23/2023 at 2:20 pm. Patient verbalized understanding.  Josie LPN

## 2023-09-16 NOTE — Telephone Encounter (Signed)
 Pt c/o medication issue:  1. Name of Medication:   metoprolol tartrate (LOPRESSOR) 25 MG tablet  apixaban (ELIQUIS) 5 MG TABS tablet   2. How are you currently taking this medication (dosage and times per day)?   As prescribed  3. Are you having a reaction (difficulty breathing--STAT)?     4. What is your medication issue?   BP  134/91  HR 129 BP  127/89  HR 121 BP  133/90  HR 115  Patient stated she is not having any other symptoms - No dizziness or chest pains.  Patient stated she just feels nervous at this time.Patient stated she had these medications changed by her PCP to half of the medication 2 days ago.  Patient stated she had increased BP and HR and took additional medication which has been bringing her BP and HR down.  Patient stated she has not had any sleep for the last 2 nights.  Patient stated if she does not answer the first time please call back a second time.

## 2023-09-16 NOTE — Telephone Encounter (Signed)
 Chief Complaint: tachycardia Symptoms: tachycardia, high bp, shakiness Frequency: this morning Pertinent Negatives: Patient denies chest pain, sob Disposition: [] ED /[x] Urgent Care (no appt availability in office) / [] Appointment(In office/virtual)/ []  [facility]/ [] Home Care/ [] Refused Recommended Disposition /[] [facility]/ []  Follow-up with PCP Additional Notes: Patient called in reporting that her medication was changed recently and now she is noticing an increase in her resting HR. Patient reports HR is currently 133 and BP 127/97. Patient reports she did take her Metoprolol this morning, but she is concerned as she is feeling "very shaky". Patient reports she does have a history of afib and is concerned that is what is happening. Patient is very tearful. As the call continued patient reports her HR was going down and was down to 120 by the end of the call. Patient reports she believes her Metoprolol is finally kicking in and her shakiness has decreased. Patient states her Metoprolol dosage was recently changed by her PCP and believes it needs to be readjusted. This RN attempted to schedule in patient appt today 3/12 but no appt available. Advised patient to go to UC for further eval. Patient advised she would call her cardiologist first to see if she could be seen there, before going to UC. Advised patient to call back with worsening symptoms. Advised patient that if symptoms return and HR is above 140, this RN would recommend ED. Patient verbalized understanding.      Copied from CRM [id number]. Topic: Clinical - Red Word Triage >> Sep 16, 2023  8:28 AM [name] wrote: Red Word that prompted transfer to Nurse Triage: Heart rate way up due to medication Reason for Disposition  [1] Heart beating very rapidly (e.g., > 140 / minute) AND [2] not present now  (Exception: During exercise.)  Answer Assessment - Initial Assessment Questions 1. DESCRIPTION: "Please describe  your heart rate or heartbeat that you are having" (e.g., fast/slow, regular/irregular, skipped or extra beats, "palpitations")     fast 2. ONSET: "When did it start?" (Minutes, hours or days)      This morning 3. DURATION: "How long does it last" (e.g., seconds, minutes, hours)     A few hours 4. PATTERN "Does it come and go, or has it been constant since it started?"  "Does it get worse with exertion?"   "Are you feeling it now?"     Constant this morning 5. TAP: "Using your hand, can you tap out what you are feeling on a chair or table in front of you, so that I can hear?" (Note: not all patients can do this)       N/a 6. HEART RATE: "Can you tell me your heart rate?" "How many beats in 15 seconds?"  (Note: not all patients can do this)       133 7. RECURRENT SYMPTOM: "Have you ever had this before?" If Yes, ask: "When was the last time?" and "What happened that time?"      When I was in afib 8. CAUSE: "What do you think is causing the palpitations?"     I'm not sure, maybe medication changes 9. CARDIAC HISTORY: "Do you have any history of heart disease?" (e.g., heart attack, angina, bypass surgery, angioplasty, arrhythmia)      afib 10. OTHER SYMPTOMS: "Do you have any other symptoms?" (e.g., dizziness, chest pain, sweating, difficulty breathing)       Shakiness, anxious  Answer Assessment - Initial Assessment Questions 1. BLOOD PRESSURE: "What is the blood  pressure?" "Did you take at least two measurements 5 minutes apart?"     127/97 2. ONSET: "When did you take your blood pressure?"     This morning 3. HOW: "How did you take your blood pressure?" (e.g., automatic home BP monitor, visiting nurse)     automatic 4. HISTORY: "Do you have a history of high blood pressure?"     yes 5. MEDICINES: "Are you taking any medicines for blood pressure?" "Have you missed any doses recently?"     metoprolol 6. OTHER SYMPTOMS: "Do you have any symptoms?" (e.g., blurred vision, chest pain,  difficulty breathing, headache, weakness)     Shakiness, anxious  Protocols used: Heart Rate and Heartbeat Questions-A-AH, Blood Pressure - High-A-AH

## 2023-09-17 ENCOUNTER — Telehealth: Payer: Self-pay

## 2023-09-17 ENCOUNTER — Ambulatory Visit: Payer: Medicare Other | Admitting: Podiatry

## 2023-09-17 NOTE — Transitions of Care (Post Inpatient/ED Visit) (Signed)
 09/17/2023  Name: [name] MRN: [id number] DOB: 1945/08/16  Today's TOC FU Call Status: Today's TOC FU Call Status:: Successful TOC FU Call Completed TOC FU Call Complete Date: 09/17/23 Patient's Name and Date of Birth confirmed.  Transition Care Management Follow-up Telephone Call Date of Discharge: 09/16/23 Discharge Facility: Other ([facility]) Name of Other ([facility]) Discharge Facility: [facility] Type of Discharge: Emergency Department Reason for ED Visit:  (palpitation) How have you been since you were released from the hospital?: Same Any questions or concerns?: Yes  Items Reviewed: Did you receive and understand the discharge instructions provided?: Yes Medications obtained,verified, and reconciled?: Yes (Medications Reviewed) Any new allergies since your discharge?: No Dietary orders reviewed?: Yes Do you have support at home?: No  Medications Reviewed Today: Medications Reviewed Today     Reviewed by Karena Addison, LPN (Licensed Practical Nurse) on 09/17/23 at 1457  Med List Status: <None>   Medication Order Taking? Sig Documenting Provider Last Dose Status Informant  acetaminophen (TYLENOL) 500 MG tablet [id number] No Take 500 mg by mouth every 6 (six) hours as needed for moderate pain.  [provider] Taking Active Self  albuterol (VENTOLIN HFA) 108 (90 Base) MCG/ACT inhaler [id number]  Inhale 1-2 puffs into the lungs every 6 (six) hours as needed for wheezing or shortness of breath. Martina Sinner, MD  Active   apixaban (ELIQUIS) 5 MG TABS tablet [id number] No Take 1 tablet (5 mg total) by mouth 2 (two) times daily. Lewayne Bunting, MD Taking Active   Ascorbic Acid (VITAMIN C PO) [id number] No Take by mouth daily. [provider] Taking Active   b complex vitamins tablet [id number] No Take 1 tablet by mouth daily. [provider] Taking Active Self  Calcium Carbonate Antacid (TUMS CHEWY BITES PO) [id number] No Take 1 tablet by  mouth daily as needed (reflux).  [provider] Taking Active Self  Calcium Citrate-Vitamin D (CALCIUM + D PO) [id number] No Take 1 tablet by mouth daily. [provider] Taking Active   Carboxymethylcellul-Glycerin (LUBRICATING EYE DROPS OP) [id number] No Place 1 drop into both eyes daily as needed (dry eyes). [provider] Taking Active Self  cephALEXin (KEFLEX) 500 MG capsule [id number] No Take 2 grams (4 tablets) by mouth 1 hour prior to dental cleaning/procedure. Meriam Sprague, MD Taking Active   clonazePAM Scarlette Calico) 0.5 MG tablet [id number] No Take 1 tablet by mouth twice daily as needed for anxiety Sheliah Hatch, MD Taking Active   cyanocobalamin (VITAMIN B12) 1000 MCG/ML injection [id number] No Inject 1,000 mcg into the muscle every 30 (thirty) days. [provider] Taking Active   denosumab (PROLIA) 60 MG/ML SOSY injection [id number] No Inject 60 mg into the skin every 6 (six) months. [provider] Taking Active Self           Med Note Vickey Sages Jun 11, 2020  2:33 PM)    doxepin (SINEQUAN) 10 MG capsule [id number] No Take 10 mg by mouth at bedtime. [provider] Taking Active   DULoxetine (CYMBALTA) 60 MG capsule [id number] No Take 60 mg by mouth daily. [provider] Taking Active   furosemide (LASIX) 20 MG tablet [id number] No Take 1 tablet (20 mg total) by mouth every other day. Jodelle Gross, NP Taking Expired 08/10/23 2359   furosemide (LASIX) 40 MG tablet [id number] No Take 1.5 tablets (60 mg total) by mouth as directed. Take 1.5 tablet by mouth on Tuesday, Thursday, and Saturday.  Jodelle Gross, NP Taking Active   levothyroxine (SYNTHROID) 75 MCG tablet [id number] No Take 1 tablet (75 mcg total) by mouth daily. Sheliah Hatch, MD Taking Active   meclizine (ANTIVERT) 25 MG tablet [id number] No TAKE 1 TABLET BY MOUTH THREE TIMES DAILY AS NEEDED FOR DIZZINESS Sheliah Hatch, MD  Taking Active   metoprolol tartrate (LOPRESSOR) 25 MG tablet [id number] No Take 1 tablet (25 mg total) by mouth 2 (two) times daily. Lewayne Bunting, MD Taking Active   mupirocin ointment (BACTROBAN) 2 % [id number] No Apply 1 Application topically daily. [provider] Taking Active   Nutritional Supplements (,FEEDING SUPPLEMENT, PROSOURCE PLUS) liquid [id number] No Take 30 mLs by mouth 2 (two) times daily between meals. Jacquelynn Cree, PA-C Taking Active   nystatin (MYCOSTATIN/NYSTOP) powder [id number] No Apply topically 2 (two) times daily as needed (skin irritation (under breasts)). Jacquelynn Cree, PA-C Taking Active   ondansetron (ZOFRAN) 8 MG tablet [id number] No Take 1 tablet (8 mg total) by mouth every 8 (eight) hours as needed for nausea or vomiting.  Patient taking differently: Take 8 mg by mouth every 8 (eight) hours as needed for nausea or vomiting. 3 tablets as needed   Sheliah Hatch, MD Taking Active   oxyCODONE (ROXICODONE) 5 MG/5ML solution [id number] No Take 4 mLs (4 mg total) by mouth every 4 (four) hours as needed for moderate pain. Jacquelynn Cree, PA-C Taking Active            Med Note Vickey Sages Jun 11, 2020  2:33 PM)    pantoprazole (PROTONIX) 40 MG tablet [id number] No Take 1 tablet by mouth once daily Martina Sinner, MD Taking Active   pantoprazole (PROTONIX) 40 MG tablet [id number] No Take 1 tablet (40 mg total) by mouth daily. Martina Sinner, MD Taking Active   polyethylene glycol [facility] / GLYCOLAX) packet [id number] No Take 17 g by mouth daily as needed for mild constipation. [provider] Taking Active Self  potassium chloride SA (KLOR-CON M) 20 MEQ tablet [id number] No Take 1 tablet (20 mEq total) by mouth daily. Meriam Sprague, MD Taking Active   revefenacin [facility]) 175 MCG/3ML nebulizer solution [id number] No Take 3 mLs (175 mcg total) by nebulization daily. Martina Sinner, MD Taking Active   sodium chloride (OCEAN)  0.65 % SOLN nasal spray [id number] No Place 1 spray into both nostrils as needed for congestion (nose bleeds). [provider] Taking Active Self  valACYclovir (VALTREX) 500 MG tablet [id number] No Take 500 mg by mouth daily as needed (breakouts).  [provider] Taking Active Self  VITAMIN D, CHOLECALCIFEROL, PO [id number] No Take by mouth daily. [provider] Taking Active   White Petrolatum-Mineral Oil (ARTIFICIAL TEARS) ointment [id number] No Place into both eyes as needed. [provider] Taking Active             Home Care and Equipment/Supplies: Were Home Health Services Ordered?: NA Any new equipment or medical supplies ordered?: NA  Functional Questionnaire: Do you need assistance with bathing/showering or dressing?: No Do you need assistance with meal preparation?: No Do you need assistance with eating?: No Do you have difficulty maintaining continence: No Do you need assistance with getting out of bed/getting out of a chair/moving?: No Do you have difficulty managing or taking your medications?: No  Follow up appointments reviewed: PCP Follow-up appointment confirmed?: No (declined appt) Specialist Hospital Follow-up appointment confirmed?: Yes Date of Specialist follow-up  appointment?: 09/24/23 Follow-Up Specialty Provider:: cardio Do you need transportation to your follow-up appointment?: No Do you understand care options if your condition(s) worsen?: Yes-patient verbalized understanding    SIGNATURE Karena Addison, LPN [facility] Nurse Health Advisor Direct Dial [phone]

## 2023-09-17 NOTE — Telephone Encounter (Signed)
 Spoke with pt, Aware of dr Ludwig Clarks recommendations.  She has a follow up next week.

## 2023-09-20 ENCOUNTER — Telehealth: Payer: Self-pay | Admitting: Pulmonary Disease

## 2023-09-20 NOTE — Telephone Encounter (Signed)
 Pt called answering service & requests an appt with dr dewald or APP , next available please

## 2023-09-21 DIAGNOSIS — M810 Age-related osteoporosis without current pathological fracture: Secondary | ICD-10-CM | POA: Diagnosis not present

## 2023-09-21 DIAGNOSIS — G629 Polyneuropathy, unspecified: Secondary | ICD-10-CM | POA: Diagnosis not present

## 2023-09-21 DIAGNOSIS — K219 Gastro-esophageal reflux disease without esophagitis: Secondary | ICD-10-CM | POA: Diagnosis not present

## 2023-09-21 DIAGNOSIS — I10 Essential (primary) hypertension: Secondary | ICD-10-CM | POA: Diagnosis not present

## 2023-09-21 DIAGNOSIS — I499 Cardiac arrhythmia, unspecified: Secondary | ICD-10-CM | POA: Diagnosis not present

## 2023-09-21 DIAGNOSIS — E039 Hypothyroidism, unspecified: Secondary | ICD-10-CM | POA: Diagnosis not present

## 2023-09-21 DIAGNOSIS — N301 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: Secondary | ICD-10-CM | POA: Diagnosis not present

## 2023-09-21 DIAGNOSIS — R911 Solitary pulmonary nodule: Secondary | ICD-10-CM | POA: Diagnosis not present

## 2023-09-21 DIAGNOSIS — E43 Unspecified severe protein-calorie malnutrition: Secondary | ICD-10-CM | POA: Diagnosis not present

## 2023-09-23 ENCOUNTER — Ambulatory Visit (INDEPENDENT_AMBULATORY_CARE_PROVIDER_SITE_OTHER): Payer: Medicare Other | Admitting: Cardiology

## 2023-09-23 ENCOUNTER — Encounter: Payer: Self-pay | Admitting: Cardiology

## 2023-09-23 ENCOUNTER — Telehealth: Payer: Self-pay | Admitting: Cardiology

## 2023-09-23 VITALS — BP 120/62 | HR 109 | Ht 66.0 in | Wt 103.0 lb

## 2023-09-23 DIAGNOSIS — F32A Depression, unspecified: Secondary | ICD-10-CM

## 2023-09-23 DIAGNOSIS — I5032 Chronic diastolic (congestive) heart failure: Secondary | ICD-10-CM | POA: Diagnosis not present

## 2023-09-23 DIAGNOSIS — F419 Anxiety disorder, unspecified: Secondary | ICD-10-CM

## 2023-09-23 DIAGNOSIS — I480 Paroxysmal atrial fibrillation: Secondary | ICD-10-CM

## 2023-09-23 DIAGNOSIS — Z9889 Other specified postprocedural states: Secondary | ICD-10-CM | POA: Diagnosis not present

## 2023-09-23 DIAGNOSIS — I10 Essential (primary) hypertension: Secondary | ICD-10-CM | POA: Diagnosis not present

## 2023-09-23 MED ORDER — AMIODARONE HCL 200 MG PO TABS: ORAL_TABLET | ORAL | 3 refills | Status: DC

## 2023-09-23 MED ORDER — FUROSEMIDE 40 MG PO TABS: 40.0000 mg | ORAL_TABLET | Freq: Every day | ORAL | Status: DC

## 2023-09-23 NOTE — Telephone Encounter (Signed)
 Pt c/o medication issue:  1. Name of Medication:  Amiodarone HCl 200 MG   2. How are you currently taking this medication (dosage and times per day)? Take 1 tablet by mouth twice daily x 2 weeks then take 1 tablet once daily   3. Are you having a reaction (difficulty breathing--STAT)? No  4. What is your medication issue? Pt is requesting a callback regarding the medication changes made at her office visit today. She stated she'd like to speak with Stanton Kidney. Please advise

## 2023-09-23 NOTE — Patient Instructions (Signed)
 Medication Instructions:   START AMIODARONE 200 MG ONE TABLET TWICE DAILY X 14 DAYS AND THEN TAKE 1 TABLET ONCE DAILY  TAKE FUROSEMIDE 40 MG ONCE DAILY  TAKE ELIQUIS 5 MG ONE TABLET TWICE DAILY  *If you need a refill on your cardiac medications before your next appointment, please call your pharmacy*   Follow-Up: At [facility], you and your health needs are our priority.  As part of our continuing mission to provide you with exceptional heart care, we have created designated Provider Care Teams.  These Care Teams include your primary Cardiologist (physician) and Advanced Practice Providers (APPs -  Physician Assistants and Nurse Practitioners) who all work together to provide you with the care you need, when you need it.      Your next appointment:   4 week(s)  Provider:   ANY APP    Then, Regina Millers, MD will plan to see you again in 3 month(s).

## 2023-09-23 NOTE — Telephone Encounter (Signed)
 Spoke with pt, patient given the okay to use heating pad on her chest. She was also given the okay to 1/2 of klonopin as needed.

## 2023-09-24 ENCOUNTER — Telehealth: Payer: Self-pay | Admitting: Home Health

## 2023-09-24 ENCOUNTER — Telehealth: Payer: Self-pay | Admitting: Cardiology

## 2023-09-24 NOTE — Telephone Encounter (Signed)
Follow Up:     Patientis returning a call from this morning.

## 2023-09-24 NOTE — Telephone Encounter (Signed)
 RN called and spoke to patient . Informed her it will be okay to use inhaler , and take Amiodarone. She also ask if she should she continue taking Metoprolol along with Amiodarone. RN informed patient yes continue all her medication .  She voiced understanding

## 2023-09-24 NOTE — Telephone Encounter (Signed)
   Yes, ok to take  Cheree Ditto, RPH (11:04 AM)

## 2023-09-24 NOTE — Telephone Encounter (Signed)
 Pt c/o medication issue:  1. Name of Medication: Amiodarone HCl 200 MG   2. How are you currently taking this medication (dosage and times per day)? As written   3. Are you having a reaction (difficulty breathing--STAT)? no  4. What is your medication issue? Pt would like a c/b in regards to the medication listed above and would like to know if its okay to take this new medication while still taking her nebulizer. Please advise

## 2023-09-24 NOTE — Telephone Encounter (Signed)
 Patient called after hour line, states she wants to verify her amiodarone dosing. Advised take it 200mg  twice daily for 2 weeks. She asked questions about if she should take oxycodone and klonopin for her pain and anxiety, that some doctor had told her she should not take both. Explained the risk of respiratory depression. Advised the patient to discuss this further with her PCP as well as PSY. She agreed.

## 2023-09-25 ENCOUNTER — Ambulatory Visit: Admitting: Podiatry

## 2023-09-25 ENCOUNTER — Ambulatory Visit: Payer: Self-pay | Admitting: Pulmonary Disease

## 2023-09-25 ENCOUNTER — Telehealth: Payer: Self-pay | Admitting: Pulmonary Disease

## 2023-09-25 NOTE — Telephone Encounter (Signed)
 Patient can continue yupelri neb daily. She stop albuterol until further discussed at next visit.  JD

## 2023-09-25 NOTE — Telephone Encounter (Signed)
 Spoke with patient regarding prior message . Per Dr.Dewald recommendations   Patient can continue yupelri neb daily. She stop albuterol until further discussed at next visit.   Patient's voice was understanding.Nothing else further needed.

## 2023-09-25 NOTE — Telephone Encounter (Signed)
  Chief Complaint: medication interaction question  Additional Notes: Pt calling with concern for medication interactions. Pt was recently started on amiodarone by cardiologist for A-fib. Pt reports med rep instructed her to call pulmonologist to advise on possible medication interaction with respiratory tx (albuterol & Mikael Spray). Pt states that she has not taken the respiratory tx since Wednesday, for fear of interaction. Pt also reports medication rep recommending Atrovent instead of albuterol. Of note, pt is extremely anxious/nervous during call of new dx of A-fib and reports that she is SOB at baseline. Triager reinforced pt to call back with any worsening sx-- pt verbalized understanding. Triager will forward encounter for Dr. Francine Graven to review and advise. Patient verbalized understanding and is expecting call back from office for next steps.    Reason for Disposition  [1] Caller has URGENT medicine question about med that PCP or specialist prescribed AND [2] triager unable to answer question  Answer Assessment - Initial Assessment Questions E2C2 Pulmonary Triage - Initial Assessment Questions "Chief Complaint (e.g., cough, sob, wheezing, fever, chills, sweat or additional symptoms) *Go to specific symptom protocol after initial questions. Questions regarding INH medication reaction with amiodarone (PACERONE)   revefenacin (YUPELRI) 175 MCG/3ML nebulizer albuterol (VENTOLIN HFA) 108 (90 Base) MCG/ACT inhaler  Reports medication representative instructed pt to call pulmonologist on further instruction re: medication interactions with amioderone. Pt reports that rep provided alternative atrovent (instead of albuterol).  Reports last took neb and albuterol a couple days ago (thinks Wednesday).   1. NAME of MEDICINE: "What medicine(s) are you calling about?"     Amioderone, albuterol, yupelri 2. QUESTION: "What is your question?" (e.g., double dose of medicine, side effect)     Medication  interaction with amioderone Also endorses taking Eliquis and metoprolol as prescribed in chart. 3. PRESCRIBER: "Who prescribed the medicine?" Reason: if prescribed by specialist, call should be referred to that group.     INH prescribed by Dr. Irena Cords 4. SYMPTOMS: "Do you have any symptoms?" If Yes, ask: "What symptoms are you having?"  "How bad are the symptoms (e.g., mild, moderate, severe)     No, reports SOB about the same. Just concerns with interactions. Reports nervousness/stressed re: recent A-fib dx Pt reports about the same than when she saw the cardiologist.  Protocols used: Medication Question Call-A-AH

## 2023-09-26 ENCOUNTER — Telehealth: Payer: Self-pay | Admitting: Student in an Organized Health Care Education/Training Program

## 2023-09-26 NOTE — Telephone Encounter (Signed)
 Received a page to the overnight cardiology on-call pager. She states that she did not see on her clinic after visit summary documentation if she should continue metoprolol succinate after she started amiodarone. I reviewed Dr. Ludwig Clarks plan with her and instructed her that it was safe to continue both.    Rosita Kea, MD MS  Cardiology [facility]

## 2023-09-29 ENCOUNTER — Encounter: Payer: Self-pay | Admitting: Family Medicine

## 2023-09-29 ENCOUNTER — Telehealth: Payer: Self-pay | Admitting: Cardiology

## 2023-09-29 NOTE — Telephone Encounter (Signed)
 Pt c/o medication issue:  1. Name of Medication: meclizine (ANTIVERT) 25 MG tablet   2. How are you currently taking this medication (dosage and times per day)? As written   3. Are you having a reaction (difficulty breathing--STAT)? none  4. What is your medication issue? Wants to know if she can keep taking this medication for dizziness. Pt also wants to know if there is anything else she can take for headache besides Tylenol

## 2023-09-29 NOTE — Telephone Encounter (Signed)
 Patient providing a update since 3/10 discussion about continued headaches patient has some thoughts as well as some fyi about health after seeing cardiology last week was placed on Amiodarone and will be checked again in 2 weeks  She is asking for OTC medications for headache considering the new cardiac development

## 2023-09-29 NOTE — Telephone Encounter (Signed)
 Secondary message from Cynthia: I failed to mention I did have a scan done and an MRI which turned up nothing. this it's always at one particular place in my head, top right hand side of my head. Is there any other specialist to see since it happens every day and becoming more frequent again. Thank you

## 2023-09-30 ENCOUNTER — Telehealth: Payer: Self-pay | Admitting: Pharmacy Technician

## 2023-09-30 ENCOUNTER — Telehealth: Payer: Self-pay | Admitting: Family Medicine

## 2023-09-30 ENCOUNTER — Telehealth: Payer: Self-pay | Admitting: Cardiology

## 2023-09-30 NOTE — Telephone Encounter (Signed)
 Copied from CRM [id number]. Topic: Clinical - Medication Question >> Sep 30, 2023  9:46 AM Tiffany S wrote: Reason for CRM: meclizine (ANTIVERT) 25 MG tablet [[id number]] ondansetron (ZOFRAN) 8 MG tablet [[id number]]  Patient wants to know while she is taking amiodarone (PACERONE) 200 MG tablet [[id number]] Can she continue to take the antivert and zofran while on Pacerone

## 2023-09-30 NOTE — Telephone Encounter (Signed)
 Patient is questioning medications and which are safe to take together. Please advise.

## 2023-09-30 NOTE — Telephone Encounter (Signed)
 PAP: Application for Eliquis has been submitted to [employer] ([employer]), via fax

## 2023-09-30 NOTE — Telephone Encounter (Signed)
 Spoke to patient she stated she just started taking Amiodarone.She wanted to know if ok to take Tylenol,Zofran,and Meclizine as needed.Advised all are ok to take as needed.Advised to keep appointment with Antionette Char NP 4/16 at 3:10 pm.

## 2023-09-30 NOTE — Telephone Encounter (Signed)
 There is no increased risk of using antivert while also using amiodarone.  I recommend not using zofran in her case while using amiodarone, because of the risk of prolonged QT. Her last EKG has a slightly long QT interval at . Would avoid zofran if possible.

## 2023-09-30 NOTE — Telephone Encounter (Signed)
 Called patient and relayed Dr.Vincent's note. Patient verbalized understanding.

## 2023-09-30 NOTE — Telephone Encounter (Signed)
Follow Up:      Patient is calling back to see what was decided. 

## 2023-10-01 ENCOUNTER — Ambulatory Visit (INDEPENDENT_AMBULATORY_CARE_PROVIDER_SITE_OTHER): Admitting: Podiatry

## 2023-10-01 DIAGNOSIS — Z91199 Patient's noncompliance with other medical treatment and regimen due to unspecified reason: Secondary | ICD-10-CM

## 2023-10-01 NOTE — Telephone Encounter (Signed)
 NFN

## 2023-10-01 NOTE — Progress Notes (Signed)
 No show

## 2023-10-02 ENCOUNTER — Telehealth: Payer: Self-pay | Admitting: Podiatry

## 2023-10-02 NOTE — Telephone Encounter (Signed)
 Pt called back still asking to speak with supervisor or someone higher. I apologized along with telling her that the supervisor is out of the office today. She says she is going to keep calling until she speak with her. I asked her with apologizing again that she do not call back today and please wait until Monday that there was no one else at the time I could refer her to.

## 2023-10-02 NOTE — Telephone Encounter (Signed)
 Pt called this morning to confirm when her next appointment. She was informed that she had an appointment yesterday 3/27. She stated that she have a heart condition and did not realize she had scheduled the appointment and that she did not get a phone call. She also stated that she called last Friday, 3/21 to confirm the appointment to say she was on the way and was told that the appointment needed to be canceled/rescheduled because the provider was not available. But did not know it was rescheduled for 3/27. She repeated several times she has a heart condition and she shouldn't be charged no show fee. She has canceled 3x's and rescheduled: 3/7, 3/13, 3/21, than no show 3/27. Confirmed her phone number and use of MyChart. She stated phone number was correct and that she use MyChart  especially for her heart appointments too. Told her our appointment is also listed on MyChart. She requested to speak with a supervisor. Supervisor was not available at the time. Informed Pt that I will send supervisor a message to have her call. She asked several times when that will be. She said she will call back in an hour if supervisor had not called her.

## 2023-10-05 ENCOUNTER — Telehealth: Payer: Self-pay | Admitting: Cardiology

## 2023-10-05 DIAGNOSIS — E43 Unspecified severe protein-calorie malnutrition: Secondary | ICD-10-CM | POA: Diagnosis not present

## 2023-10-05 DIAGNOSIS — K219 Gastro-esophageal reflux disease without esophagitis: Secondary | ICD-10-CM | POA: Diagnosis not present

## 2023-10-05 DIAGNOSIS — M810 Age-related osteoporosis without current pathological fracture: Secondary | ICD-10-CM | POA: Diagnosis not present

## 2023-10-05 DIAGNOSIS — I10 Essential (primary) hypertension: Secondary | ICD-10-CM | POA: Diagnosis not present

## 2023-10-05 DIAGNOSIS — R911 Solitary pulmonary nodule: Secondary | ICD-10-CM | POA: Diagnosis not present

## 2023-10-05 DIAGNOSIS — G629 Polyneuropathy, unspecified: Secondary | ICD-10-CM | POA: Diagnosis not present

## 2023-10-05 DIAGNOSIS — I499 Cardiac arrhythmia, unspecified: Secondary | ICD-10-CM | POA: Diagnosis not present

## 2023-10-05 DIAGNOSIS — E039 Hypothyroidism, unspecified: Secondary | ICD-10-CM | POA: Diagnosis not present

## 2023-10-05 DIAGNOSIS — N301 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: Secondary | ICD-10-CM | POA: Diagnosis not present

## 2023-10-05 NOTE — Telephone Encounter (Signed)
 Spoke to patient she stated she was recently diagnosed with afib.Stated over the weekend she had sob.Stated she feels weak and shaky today.Appointment scheduled with Reather Littler NP 4/4 at 1:55 pm.

## 2023-10-05 NOTE — Progress Notes (Signed)
 Cardiology Office Note    Date:  10/11/2023  ID:  Regina, [name] [birth year], MRN [id number] PCP:  Sheliah Hatch, MD  Cardiologist:  Olga Millers, MD  Electrophysiologist:  Regan Lemming, MD   Chief Complaint: Follow up for shortness of breath   History of Present Illness: .    [name] is a 78 y.o. female with visit-pertinent history of chronic dyspnea, PAF, previous MV repair, coronary artery disease.  Patient had an echocardiogram in 07/2019 that showed EF 60 to 65%, no RWMA, normal RV systolic function, moderate mitral valve regurgitation with by leaflet prolapse.  She underwent TEE on 11/21/2019 that showed severe mitral valve regurgitation with a flail segment of the anterior leaflet.  Is recommended that she undergo mitral valve repair.  Prior to procedure patient had a right/left heart catheterization on 12/02/2019 that showed mild, nonobstructive CAD with 20% stenosis in the proximal RCA and distal LAD.  She was seen by Dr. Barry Dienes and underwent minimally invasive mitral valve repair, complete maze procedure and sternotomy with repair of left ventricular free wall laceration on 01/17/2020.  Postoperatively she had acute blood loss anemia requiring blood transfusions and A-fib with RVR for which she was placed on amiodarone and Lopressor and Tikosyn was discontinued.  Later amiodarone was discontinued and her A-fib has been managed with Eliquis and metoprolol.  Patient has noted history of chronic dyspnea, echocardiogram from 02/2022 showed EF 50 to 55%, moderately reduced RV systolic function.  There was interventricular septal flattening in systole consistent with RV pressure overload.  Findings were consistent with normal structure and function of mitral valve prosthesis.  Follow-up chest CT in 02/2022 revealed right middle lobe and left upper lobe nodularity suggestive of atypical infection, nodule in the left major fissure.  She is followed by Dr. Francine Graven with  pulmonology.  When seen by cardiology in 10/2022 patient reported frequent nosebleeds while on Eliquis.  She also noted pain in her calves and ankles, underwent ABIs in 11/2022 that were normal.  Echocardiogram on 01/27/2023 indicated LVEF of 55 to 60%, LV with normal function, mild LVH, into her ventricular septum is flattened in systole, consistent with right ventricular pressure overload.  RV systolic function moderately reduced, RV size moderately enlarged, moderately elevated pulmonary artery systolic pressure.  Mitral valve repair/replaced, trivial mitral valve regurgitation, mean gradient 2.0 mmHg with an average heart rate of 60 bpm.  Patient was seen in clinic on 07/07/2023.  At that time patient reported feelings of palpitations and elevated heart rate.  Restarting amiodarone was discussed with patient preferred to continue current treatment and monitor symptoms.  She remained on the Toprol tartrate 25 mg twice daily and Eliquis 5 mg twice daily.  Patient was last seen in clinic on 09/23/2023, she had [facility] by report was in recurrent atrial fibrillation.  Patient was seen by Dr. Jens Som, noted to continue having dyspnea on exertion which was unchanged.  EKG indicated that she was back in atrial fibrillation, heart rate was mildly elevated and blood pressure was borderline.  She was started on amiodarone 200 mg twice a day for 2 weeks then 200 mg daily thereafter with recommendation to proceed with cardioversion in approximately 4 weeks if A-fib persisted.  Her Eliquis was increased to 5 mg twice daily in anticipation of cardioversion.  Today patient presents regarding increased shortness of breath.  Patient notes that this is a chronic and ongoing issue, denies any significant changes recently.  She  does note however that she has not been using her inhalers, notes that a pharmacist told her not to use one of them although she is unsure of which one.  She notes that she has  been extremely anxious since being told she is back in atrial fibrillation.  She notes that she has been seeing a psychiatrist recently however any medications for anxiety that she tries actually causes her anxiety to worsen.  ROS: .   Today she denies chest pain, lower extremity edema,  melena, hematuria, hemoptysis, diaphoresis, weakness, presyncope, syncope, orthopnea, and PND.  All other systems are reviewed and otherwise negative. Studies Reviewed: [name]    EKG:  EKG is ordered today, personally reviewed, demonstrating  EKG Interpretation Date/Time:  Friday October 09 2023 14:03:49 EDT Ventricular Rate:  84 PR Interval:    QRS Duration:  76 QT Interval:  386 QTC Calculation: 456 R Axis:   108  Text Interpretation: Atrial fibrillation When compared with ECG of 23-Sep-2023 14:06, QRS duration has decreased Confirmed by Reather Littler [id number]) on 10/11/2023 12:10:48 PM   CV Studies: Cardiac studies reviewed are outlined and summarized above. Otherwise please see EMR for full report. Cardiac Studies & Procedures   ______________________________________________________________________________________________ CARDIAC CATHETERIZATION  CARDIAC CATHETERIZATION 12/02/2019  Narrative  Prox RCA lesion is 20% stenosed.  Dist LAD lesion is 20% stenosed.  1. Mild non-obstructive CAD  Recommendation: Continue planning for mitral valve repair.  Findings Coronary Findings Diagnostic  Dominance: Right  Left Anterior Descending Vessel is large. Dist LAD lesion is 20% stenosed.  Left Circumflex Vessel is small.  Right Coronary Artery Vessel is large. Prox RCA lesion is 20% stenosed.  Intervention  No interventions have been documented.     ECHOCARDIOGRAM  ECHOCARDIOGRAM COMPLETE 01/27/2023  Narrative ECHOCARDIOGRAM REPORT    Patient Name:   [name] Date of Exam: 01/27/2023 Medical Rec #:  [id number]   Height:       66.0 in Accession #:    [id number]  Weight:       114.2  lb Date of Birth:  Jun 07, 1946   BSA:          1.576 m Patient Age:    10 years    BP:           100/60 mmHg Patient Gender: F           HR:           65 bpm. Exam Location:  Outpatient  Procedure: 2D Echo, Cardiac Doppler and Color Doppler  Indications:    S/P Mitral Valve repair Z98.89  History:        Patient has prior history of Echocardiogram examinations, most recent 02/12/2022. Arrythmias:Atrial Fibrillation; Signs/Symptoms:Shortness of Breath.  Mitral Valve: 32 mm Sorin Memo 4D prosthetic annuloplasty ring valve is present in the mitral position. Procedure Date: 01/17/20.  Sonographer:    Eulah Pont RDCS Referring Phys: [id number] Jonita Albee   Sonographer Comments: Image acquisition challenging due to patient body habitus. IMPRESSIONS   1. Left ventricular ejection fraction, by estimation, is 55 to 60%. The left ventricle has normal function. Left ventricular endocardial border not optimally defined to evaluate regional wall motion. There is mild left ventricular hypertrophy. Left ventricular diastolic function could not be evaluated. There is the interventricular septum is flattened in systole, consistent with right ventricular pressure overload. 2. Right ventricular systolic function is moderately reduced. The right ventricular size is moderately enlarged. There is moderately elevated pulmonary artery systolic pressure. The estimated  right ventricular systolic pressure is 48.9 mmHg. 3. Right atrial size was moderately dilated. 4. The mitral valve has been repaired/replaced. Trivial mitral valve regurgitation. The mean mitral valve gradient is 2.0 mmHg with average heart rate of 63 bpm. There is a 32 mm Sorin Memo 4D prosthetic annuloplasty ring present in the mitral position. Procedure Date: 01/17/20. Echo findings are consistent with normal structure and function of the mitral valve prosthesis. 5. Tricuspid valve regurgitation is moderate. 6. The aortic valve is  tricuspid. There is mild calcification of the aortic valve. There is mild thickening of the aortic valve. Aortic valve regurgitation is mild. Aortic valve sclerosis is present, with no evidence of aortic valve stenosis. 7. Pulmonic valve regurgitation is moderate. 8. The inferior vena cava is dilated in size with <50% respiratory variability, suggesting right atrial pressure of 15 mmHg.  FINDINGS Left Ventricle: Left ventricular ejection fraction, by estimation, is 55 to 60%. The left ventricle has normal function. Left ventricular endocardial border not optimally defined to evaluate regional wall motion. The left ventricular internal cavity size was normal in size. There is mild left ventricular hypertrophy. The interventricular septum is flattened in systole, consistent with right ventricular pressure overload. Left ventricular diastolic function could not be evaluated.  Right Ventricle: The right ventricular size is moderately enlarged. No increase in right ventricular wall thickness. Right ventricular systolic function is moderately reduced. There is moderately elevated pulmonary artery systolic pressure. The tricuspid regurgitant velocity is 2.91 m/s, and with an assumed right atrial pressure of 15 mmHg, the estimated right ventricular systolic pressure is 48.9 mmHg.  Left [facility]: Left atrial size was normal in size.  Right [facility]: Right atrial size was moderately dilated.  Pericardium: There is no evidence of pericardial effusion.  Mitral Valve: The mitral valve has been repaired/replaced. Trivial mitral valve regurgitation. There is a 32 mm Sorin Memo 4D prosthetic annuloplasty ring present in the mitral position. Procedure Date: 01/17/20. Echo findings are consistent with normal structure and function of the mitral valve prosthesis. MV peak gradient, 8.8 mmHg. The mean mitral valve gradient is 2.0 mmHg with average heart rate of 63 bpm.  Tricuspid Valve: The tricuspid valve is normal in  structure. Tricuspid valve regurgitation is moderate.  Aortic Valve: The aortic valve is tricuspid. There is mild calcification of the aortic valve. There is mild thickening of the aortic valve. Aortic valve regurgitation is mild. Aortic valve sclerosis is present, with no evidence of aortic valve stenosis.  Pulmonic Valve: The pulmonic valve was normal in structure. Pulmonic valve regurgitation is moderate.  Aorta: The aortic root is normal in size and structure.  Venous: The inferior vena cava is dilated in size with less than 50% respiratory variability, suggesting right atrial pressure of 15 mmHg.  IAS/Shunts: The atrial septum is grossly normal.   LEFT VENTRICLE PLAX 2D LVIDd:         2.90 cm LVIDs:         2.10 cm LV PW:         1.10 cm LV IVS:        1.00 cm LVOT diam:     1.90 cm LV SV:         42 LV SV Index:   27 LVOT Area:     2.84 cm  LV Volumes (MOD) LV vol d, MOD A2C: 59.7 ml LV vol d, MOD A4C: 44.9 ml LV vol s, MOD A2C: 23.1 ml LV vol s, MOD A4C: 14.9 ml LV SV MOD A2C:  36.6 ml LV SV MOD A4C:     44.9 ml LV SV MOD BP:      33.8 ml  RIGHT VENTRICLE RV S prime:     8.99 cm/s TAPSE (M-mode): 1.5 cm  LEFT ATRIUM             Index        RIGHT ATRIUM           Index LA diam:        3.10 cm 1.97 cm/m   RA Area:     20.30 cm LA Vol (A2C):   36.9 ml 23.41 ml/m  RA Volume:   68.10 ml  43.21 ml/m LA Vol (A4C):   40.9 ml 25.95 ml/m LA Biplane Vol: 40.0 ml 25.38 ml/m AORTIC VALVE                   PULMONIC VALVE LVOT Vmax:         70.90 cm/s  PR End Diast Vel: 6.15 msec LVOT Vmean:        43.400 cm/s LVOT VTI:          0.149 m AR Vena Contracta: 0.30 cm  AORTA Ao Root diam: 3.00 cm Ao Asc diam:  3.00 cm  MITRAL VALVE              TRICUSPID VALVE MV Area (PHT): 2.89 cm   TR Peak grad:   33.9 mmHg MV Area VTI:   1.19 cm   TR Vmax:        291.00 cm/s MV Peak grad:  8.8 mmHg MV Mean grad:  2.0 mmHg   SHUNTS MV Vmax:       1.48 m/s   Systemic VTI:   0.15 m MV Vmean:      62.3 cm/s  Systemic Diam: 1.90 cm  Weston Brass MD Electronically signed by Weston Brass MD Signature Date/Time: 01/28/2023/9:53:11 PM    Final   TEE  ECHO TEE 01/26/2020  Narrative TRANSESOPHOGEAL ECHO REPORT    Patient Name:   MARGHERITA COLLYER Date of Exam: 01/26/2020 Medical Rec #:  [id number]   Height:       66.0 in Accession #:    [id number]  Weight:       136.7 lb Date of Birth:  02-01-1946   BSA:          1.701 m Patient Age:    74 years    BP:           118/66 mmHg Patient Gender: F           HR:           123 bpm. Exam Location:  Inpatient  Procedure: TEE-Intraopertive and 3D Echo  Indications:     atrial fibrillation  History:         Patient has prior history of Echocardiogram examinations, most recent 11/21/2019. Arrythmias:Atrial Fibrillation.  Mitral Valve: 32 mm prosthetic annuloplasty ring valve is present in the mitral position. Procedure Date: 01/17/2020.  Sonographer:     Delcie Roch Referring Phys:  [id number] Graciella Freer Diagnosing Phys: Lennie Odor MD  PROCEDURE: After discussion of the risks and benefits of a TEE, an informed consent was obtained from the patient. TEE procedure time was 20 minutes. The transesophogeal probe was passed without difficulty through the esophogus of the patient. Imaged were obtained with the patient in a left lateral decubitus position. Local oropharyngeal anesthetic was provided with Cetacaine. Sedation performed by different physician.  The patient was monitored while under deep sedation. Anesthestetic sedation was provided intravenously by Anesthesiology: 270mg  of Propofol. Image quality was excellent. The patient's vital signs; including heart rate, blood pressure, and oxygen saturation; remained stable throughout the procedure. The patient developed no complications during the procedure. A successful direct current cardioversion was performed at 200 joules with 1  attempt.  IMPRESSIONS   1. Left ventricular ejection fraction, by estimation, is 55 to 60%. The left ventricle has normal function. The left ventricle has no regional wall motion abnormalities. 2. Right ventricular systolic function is normal. The right ventricular size is normal. 3. S/p surgical LAA clipping. No residual connection noted with the LA. No LA thrombus. No left atrial/left atrial appendage thrombus was detected. 4. 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 No residual MR. MVA by direct 3D MPR assessment 2.6 cm2. The mitral valve has been repaired/replaced. No evidence of mitral valve regurgitation. No evidence of mitral stenosis. The mean mitral valve gradient is 4.0 mmHg with average heart rate of 111 bpm. There is a 32 mm prosthetic annuloplasty ring present in the mitral position. Procedure Date: 01/17/2020. 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 6. The aortic valve is tricuspid. Aortic valve regurgitation is not visualized. No aortic stenosis is present.  Conclusion(s)/Recommendation(s): No LA/LAA thrombus identified. Successful cardioversion performed with restoration of normal sinus rhythm.  FINDINGS Left Ventricle: Left ventricular ejection fraction, by estimation, is 55 to 60%. The left ventricle has normal function. The left ventricle has no regional wall motion abnormalities. The left ventricular internal cavity size was normal in size. There is no left ventricular hypertrophy.  Right Ventricle: The right ventricular size is normal. No increase in right ventricular wall thickness. Right ventricular systolic function is normal.  Left [facility]: S/p surgical LAA clipping. No residual connection noted with the LA. No LA thrombus. Left atrial size was normal in size. No left atrial/left atrial appendage thrombus was detected.  Right [facility]: Right atrial size was normal in size.  Pericardium: Trivial pericardial effusion is present.  Mitral Valve: S/p MV repair with 32 mm  annuloplasty ring. No residual MR. MVA by direct 3D MPR assessment 2.6 cm2. The mitral valve has been repaired/replaced. No evidence of mitral valve regurgitation. There is a 32 mm prosthetic annuloplasty ring present in the mitral position. Procedure Date: 01/17/2020. No evidence of mitral valve stenosis. The mean mitral valve gradient is 4.0 mmHg with average heart rate of 111 bpm.  Tricuspid Valve: The tricuspid valve is myxomatous. Tricuspid valve regurgitation is mild . No evidence of tricuspid stenosis.  Aortic Valve: The aortic valve is tricuspid. Aortic valve regurgitation is not visualized. No aortic stenosis is present.  Pulmonic Valve: The pulmonic valve was grossly normal. Pulmonic valve regurgitation is mild. No evidence of pulmonic stenosis.  Aorta: The aortic root, ascending aorta, aortic arch and descending aorta are all structurally normal, with no evidence of dilitation or obstruction.  Venous: The left upper pulmonary vein, left lower pulmonary vein and right upper pulmonary vein are normal.  IAS/Shunts: There is redundancy of the interatrial septum. No atrial level shunt detected by color flow Doppler.  Additional Comments: There is a small pleural effusion in the left lateral region.    AORTA Ao Root diam: 3.20 cm Ao Asc diam:  2.80 cm  MITRAL VALVE           TRICUSPID VALVE MV Mean grad: 4.0 mmHg TR Peak grad:   25.0 mmHg TR Vmax:  250.00 cm/s  Lennie Odor MD Electronically signed by Lennie Odor MD Signature Date/Time: 01/26/2020/11:22:07 AM    Final  MONITORS  LONG TERM MONITOR (3-14 DAYS) 06/06/2019  Narrative Max 226 bpm 06:22pm, 11/15 Min 54 bpm 12:59am, 11/15 Avg 76 bpm Sinus rhythm with occasional runs of SVT, longest 15 seconds at 123 bmp Rare PVCs, 2.2% PACs Zero atrial fibrillation noted Patient trigger associated with sinus rhythm, rate 87  Will Camnitz, MD        ______________________________________________________________________________________________       Current Reported Medications:.    Current Meds  Medication Sig   acetaminophen (TYLENOL) 500 MG tablet Take 500 mg by mouth every 6 (six) hours as needed for moderate pain.    amiodarone (PACERONE) 200 MG tablet Take 1 tablet by mouth twice daily x 2 weeks then take 1 tablet once daily   apixaban (ELIQUIS) 5 MG TABS tablet Take 1 tablet (5 mg total) by mouth 2 (two) times daily.   b complex vitamins tablet Take 1 tablet by mouth daily.   Calcium Carbonate Antacid (TUMS CHEWY BITES PO) Take 1 tablet by mouth daily as needed (reflux).    Calcium Citrate-Vitamin D (CALCIUM + D PO) Take 1 tablet by mouth daily.   Carboxymethylcellul-Glycerin (LUBRICATING EYE DROPS OP) Place 1 drop into both eyes daily as needed (dry eyes).   cyanocobalamin (VITAMIN B12) 1000 MCG/ML injection Inject 1,000 mcg into the muscle every 30 (thirty) days.   denosumab (PROLIA) 60 MG/ML SOSY injection Inject 60 mg into the skin every 6 (six) months.   furosemide (LASIX) 40 MG tablet Take 1 tablet (40 mg total) by mouth daily.   levothyroxine (SYNTHROID) 75 MCG tablet Take 1 tablet (75 mcg total) by mouth daily.   meclizine (ANTIVERT) 25 MG tablet TAKE 1 TABLET BY MOUTH THREE TIMES DAILY AS NEEDED FOR DIZZINESS   metoprolol tartrate (LOPRESSOR) 25 MG tablet Take 1 tablet by mouth twice daily   Nutritional Supplements (,FEEDING SUPPLEMENT, PROSOURCE PLUS) liquid Take 30 mLs by mouth 2 (two) times daily between meals.   nystatin (MYCOSTATIN/NYSTOP) powder Apply topically 2 (two) times daily as needed (skin irritation (under breasts)).   ondansetron (ZOFRAN) 8 MG tablet Take 1 tablet (8 mg total) by mouth every 8 (eight) hours as needed for nausea or vomiting. (Patient taking differently: Take 8 mg by mouth every 8 (eight) hours as needed for nausea or vomiting. 3 tablets as needed)   oxyCODONE (ROXICODONE) 5 MG/5ML  solution Take 4 mLs (4 mg total) by mouth every 4 (four) hours as needed for moderate pain.   pantoprazole (PROTONIX) 40 MG tablet Take 1 tablet by mouth once daily   pantoprazole (PROTONIX) 40 MG tablet Take 1 tablet (40 mg total) by mouth daily.   polyethylene glycol (MIRALAX / GLYCOLAX) packet Take 17 g by mouth daily as needed for mild constipation.   potassium chloride SA (KLOR-CON M) 20 MEQ tablet Take 1 tablet (20 mEq total) by mouth daily.   revefenacin (YUPELRI) 175 MCG/3ML nebulizer solution Take 3 mLs (175 mcg total) by nebulization daily.   sodium chloride (OCEAN) 0.65 % SOLN nasal spray Place 1 spray into both nostrils as needed for congestion (nose bleeds).   valACYclovir (VALTREX) 500 MG tablet Take 500 mg by mouth daily as needed (breakouts).    VITAMIN D, CHOLECALCIFEROL, PO Take by mouth daily.   White Petrolatum-Mineral Oil (ARTIFICIAL TEARS) ointment Place into both eyes as needed.    Physical Exam:    VS:  BP 116/72   Pulse 84   Ht 5\' 6"  (1.676 m)   Wt 105 lb (47.6 kg)   SpO2 94%   BMI 16.95 kg/m    Wt Readings from Last 3 Encounters:  10/09/23 105 lb (47.6 kg)  09/23/23 103 lb (46.7 kg)  09/14/23 106 lb (48.1 kg)    GEN: Well nourished, well developed in no acute distress NECK: No JVD; No carotid bruits CARDIAC: Irregular RR, no murmurs, rubs, gallops RESPIRATORY:  Clear to auscultation without rales, wheezing or rhonchi  ABDOMEN: Soft, non-tender, non-distended EXTREMITIES:  No edema; No acute deformity     Asessement and Plan:.    Paroxysmal atrial fibrillation: Patient with history of PAF, 1 last seen by Dr. Jens Som on 3/19 and was back in recurrent atrial fibrillation.  She was started on amiodarone 200 mg twice daily for 2 weeks then 200 mg daily thereafter with recommendation to proceed with cardioversion in 4 weeks if A-fib persisted.  Eliquis was increased to 5 mg twice daily in anticipation of cardioversion. EKG today indicates atrial fibrillation  with improved rate control.  Patient notes that she decreased to amiodarone 200 mg daily.  She notes she has been tolerating Eliquis 5 mg twice daily well.  Will have patient follow-up in 2 weeks as as originally scheduled to discuss cardioversion.  Reminded patient that she must remain on Eliquis uninterrupted prior to cardioversion.  Reviewed ED precautions.  Hypertension: Blood pressure today 116/72.  Continue metoprolol tartrate 25 mg twice daily.  Chronic HFpEF/chronic dyspnea: Echocardiogram from 01/2023 showed EF 55 to 60%, mild LVH, moderately reduced RV function with moderately elevated PA systolic pressure, moderate TR. She appears euvolemic and well compensated on exam. Patient also follows with pulmonology, has underlying bronchiectasis, pulmonology notes shortness of breath is multifactorial from moderate restriction on PFTs, bronchiectasis and deconditioning.  Recommended patient follow-up with her pulmonologist regarding her inhalers as she notes she has not been taking 1 but is unsure of which medication.  Chest pain/nonobstructive CAD: Patient has history of chronic, atypical chest pain.  Previous cath in 2021 showed mild nonobstructive CAD. Stable with no anginal symptoms. No indication for ischemic evaluation.  She has previously declined statins.Not on ASA due to Eliquis use.  OSA: Patient previously found to have nocturnal hypoxemia on overnight oximetry chest in 09/2022.  Patient has declined CPAP therapy.  S/p mitral valve repair: Most recent echo from 01/2023 showed trace mitral regurgitation and no significant mitral stenosis.  32 mm Sorin memo 4D prosthetic annuloplasty ring in the mitral position.  Continue SBE prophylaxis  Anxiety: Patient reports that she is now being followed by a psychiatrist.  Notes problems with multiple anxiety medications.  Encouraged to continue following.    Disposition: F/u with Reather Littler, NP in two weeks as scheduled.   Signed, Rip Harbour,  NP

## 2023-10-05 NOTE — Telephone Encounter (Signed)
 Pt c/o Shortness Of Breath: STAT if SOB developed within the last 24 hours or pt is noticeably SOB on the phone  1. Are you currently SOB (can you hear that pt is SOB on the phone)? no  2. How long have you been experiencing SOB? Since Friday  3. Are you SOB when sitting or when up moving around? both  4. Are you currently experiencing any other symptoms? Back pain, headache

## 2023-10-06 ENCOUNTER — Encounter: Payer: Self-pay | Admitting: Pharmacy Technician

## 2023-10-06 NOTE — Telephone Encounter (Signed)
 PAP: Patient has been denied for patient assistance by [employer] ([employer]) due to below

## 2023-10-07 ENCOUNTER — Telehealth: Payer: Self-pay | Admitting: Cardiology

## 2023-10-07 ENCOUNTER — Telehealth: Payer: Self-pay | Admitting: Pharmacy Technician

## 2023-10-07 ENCOUNTER — Other Ambulatory Visit: Payer: Self-pay | Admitting: Cardiology

## 2023-10-07 ENCOUNTER — Other Ambulatory Visit (HOSPITAL_COMMUNITY): Payer: Self-pay

## 2023-10-07 DIAGNOSIS — R0602 Shortness of breath: Secondary | ICD-10-CM

## 2023-10-07 NOTE — Telephone Encounter (Signed)
 Pt requesting cb from Debra Banker) to discuss issue with Eliquis

## 2023-10-07 NOTE — Telephone Encounter (Signed)
 I spoke to the patient and called BMS, she was denied due to not spending 3 percent out of pocket prescription expenses yet of her income. I called BMS and they said they can not longer tell us how much the patient hast to meet before it would be approved. They said based on what she has sent, it looks like she wouldn't be approved until sept. I explained the medicare payment plan option but she said she can not afford any other high payments. I asked if she would like for [place] to ask if she can change the med to something cheaper and she said she doesn't want to change the med because she already has so many issues with meds and she knows she can take eliquis even with the occasionally nose bleeds. She is really worried about this and stating she can not afford the copay of 114.00 that will be due at her next fill of eliquis. She states she has enough for about 2 more weeks and then she will be out. I told her to keep getting the out of pocket expense report from her pharmacy and keep sending in and I will keep submitting to BMS to try to get her approved but as far as our side, there isnt any other options. I'm sending to you guys to see if maybe social work has an option to help her because she always mentioned how expense housing and everything she has based on her very limited income. If there is anything else I could do that I am missing please let me know. I have asked our contacts and there is nothing else we can think of on our side. Thank you so much.

## 2023-10-07 NOTE — Telephone Encounter (Signed)
 Patient called back and said she would like to know how much she hast to meet before approved. I called BMS and they said they no longer tell the amount and they said it looks like she would be eligible in sept. I ran a test claim and it would be 114 for 30 days and 324 for 90 days. The patient does not want to do medicare payment plan (she said she couldn't afford a payment plan), she said doesn't want to change the med to something else (she said she has problems with medications and is scared to change bc of the side effects of other drugs), she said she will get another OOP expense report and I told her that I would keep submitting to BMS to see if she gets approved.

## 2023-10-07 NOTE — Telephone Encounter (Signed)
 Spoke to patient she stated she needs to speak to Dr.Crenhaw's RN about insurance form for Eliquis.Advised Stanton Kidney is out of office today.She only wants to speak to Dailey.Advised I will send message to her.

## 2023-10-08 ENCOUNTER — Other Ambulatory Visit (HOSPITAL_COMMUNITY): Payer: Self-pay

## 2023-10-08 NOTE — Telephone Encounter (Signed)
 Patient called back this morning and she was able to work out payments with [facility] and they will start mailing her eliquis. Can someone send a prescription for eliquis to [facility] mail order? [facility] is not on her pharmacy list but I confirmed that is where she needs this to go please and thank you!

## 2023-10-08 NOTE — Telephone Encounter (Signed)
 Patient is now going to get med from [facility] where they are going to cut her price in half

## 2023-10-09 ENCOUNTER — Encounter: Payer: Self-pay | Admitting: Cardiology

## 2023-10-09 ENCOUNTER — Other Ambulatory Visit (HOSPITAL_COMMUNITY): Payer: Self-pay

## 2023-10-09 ENCOUNTER — Other Ambulatory Visit: Payer: Self-pay

## 2023-10-09 ENCOUNTER — Ambulatory Visit: Attending: Cardiology | Admitting: Cardiology

## 2023-10-09 VITALS — BP 116/72 | HR 84 | Ht 66.0 in | Wt 105.0 lb

## 2023-10-09 DIAGNOSIS — I5032 Chronic diastolic (congestive) heart failure: Secondary | ICD-10-CM | POA: Insufficient documentation

## 2023-10-09 DIAGNOSIS — F419 Anxiety disorder, unspecified: Secondary | ICD-10-CM | POA: Diagnosis not present

## 2023-10-09 DIAGNOSIS — I480 Paroxysmal atrial fibrillation: Secondary | ICD-10-CM | POA: Diagnosis not present

## 2023-10-09 DIAGNOSIS — R0602 Shortness of breath: Secondary | ICD-10-CM | POA: Insufficient documentation

## 2023-10-09 DIAGNOSIS — Z9889 Other specified postprocedural states: Secondary | ICD-10-CM | POA: Insufficient documentation

## 2023-10-09 DIAGNOSIS — F32A Depression, unspecified: Secondary | ICD-10-CM | POA: Insufficient documentation

## 2023-10-09 DIAGNOSIS — I10 Essential (primary) hypertension: Secondary | ICD-10-CM | POA: Insufficient documentation

## 2023-10-09 MED ORDER — APIXABAN 5 MG PO TABS: 5.0000 mg | ORAL_TABLET | Freq: Two times a day (BID) | ORAL | 1 refills | Status: DC

## 2023-10-09 NOTE — Telephone Encounter (Signed)
 Prescription refill request for Eliquis received. Indication: Afib  Last office visit:09/23/23 (Crenshaw)  Scr: 0.76 (09/16/23)  Age: 78 Weight: 46.7kg  Appropriate dose. Refill sent.

## 2023-10-09 NOTE — Patient Instructions (Signed)
   Follow-Up: At [facility], you and your health needs are our priority.  As part of our continuing mission to provide you with exceptional heart care, our providers are all part of one team.  This team includes your primary Cardiologist (physician) and Advanced Practice Providers or APPs (Physician Assistants and Nurse Practitioners) who all work together to provide you with the care you need, when you need it.  Your next appointment:   As scheduled        1st Floor: - Lobby - Registration  - Pharmacy  - Lab - Cafe  2nd Floor: - PV Lab - Diagnostic Testing (echo, CT, nuclear med)  3rd Floor: - Vacant  4th Floor: - TCTS (cardiothoracic surgery) - AFib Clinic - Structural Heart Clinic - Vascular Surgery  - Vascular Ultrasound  5th Floor: - HeartCare Cardiology (general and EP) - Clinical Pharmacy for coumadin, hypertension, lipid, weight-loss medications, and med management appointments    Valet parking services will be available as well.

## 2023-10-11 ENCOUNTER — Encounter: Payer: Self-pay | Admitting: Cardiology

## 2023-10-12 ENCOUNTER — Telehealth: Payer: Self-pay | Admitting: Licensed Clinical Social Worker

## 2023-10-12 ENCOUNTER — Telehealth: Payer: Self-pay | Admitting: Cardiology

## 2023-10-12 DIAGNOSIS — M961 Postlaminectomy syndrome, not elsewhere classified: Secondary | ICD-10-CM | POA: Diagnosis not present

## 2023-10-12 DIAGNOSIS — M5412 Radiculopathy, cervical region: Secondary | ICD-10-CM | POA: Diagnosis not present

## 2023-10-12 DIAGNOSIS — Z79891 Long term (current) use of opiate analgesic: Secondary | ICD-10-CM | POA: Diagnosis not present

## 2023-10-12 DIAGNOSIS — M4693 Unspecified inflammatory spondylopathy, cervicothoracic region: Secondary | ICD-10-CM | POA: Diagnosis not present

## 2023-10-12 DIAGNOSIS — G894 Chronic pain syndrome: Secondary | ICD-10-CM | POA: Diagnosis not present

## 2023-10-12 NOTE — Telephone Encounter (Signed)
 Spoke with pt, she reports that the price of the eliquis has not changed and according to [facility], they were going to help her with the price. Encouraged patient to call [employer] and ask, she has to spend a certain amount out-of-pocket before she will be able to get it from the company.

## 2023-10-12 NOTE — Telephone Encounter (Signed)
 Pt c/o medication issue:  1. Name of Medication: apixaban (ELIQUIS) 5 MG TABS tablet   2. How are you currently taking this medication (dosage and times per day)?  Take 1 tablet (5 mg total) by mouth 2 (two) times daily.       3. Are you having a reaction (difficulty breathing--STAT)? No  4. What is your medication issue? Pt is requesting a callback regarding her wanting to speak with nurse about concerns and her trying to get this medication. Pt only wanted to speak with nurse about it. Please advise

## 2023-10-12 NOTE — Progress Notes (Signed)
 Heart and Vascular Care Navigation  10/12/2023  [name] May 27, 1946 [id number]  Reason for Referral: cost of living/ financial challenges Patient is participating in a Managed Medicaid Plan: No, Medicare A&B and Supplement  Engaged with patient by telephone for initial visit for Heart and Vascular Care Coordination.                                                                                                   Assessment:                                     LCSW was able to reach pt today at [phone]. Introduced self, role, reason for call. Confirmed home address and PCP, resides alone- emergency contacts remain her sisters. She has one adult son that lives in [place]. She shares that she sold a house in [place] and had moved to [place] with son. He is now married and she feels isolated in [place] as no family nearby. She relies on social security income for her bills. She does not receive any additional assistance such as SNAP. She does have access to transportation, housing, utilities, and food at this time without any notices of disconnection or eviction and denies any instability with housing at this time. She would like to move closer to family if able to in the future. She shares that she is mostly concerned by the costs of her insurance and her Eliquis (denies issues with any other medications at this time). We discussed that a good resource for her to utilize would likely be [facility] as they are able to speak with her regarding any Medicare assistance programs that may be available including the Medicare payment plan should she have logistical questions. Also recommended that if she feels she would like to consider alternate Medicare coverage next year that we cannot recommend a particular plan but the Medicare counselors at [facility] could review her options with her.   I will send her these community resources and additional cost savings information such as SNAP enrollment,  food pantries, and housing support. Pt agreeable to these being mailed to her at this time.  No additional questions, encouraged her to call me as needed should any arise.   HRT/VAS Care Coordination     Patients Home Cardiology Office [facility]   Outpatient Care Team Social Worker   Social Worker Name: Octavio Graves, [name], [phone]   Living arrangements for the past 2 months Single Family Home   Lives with: Self   Patient Current Insurance Coverage Traditional Medicare  and supplement   Patient Has Concern With Paying Medical Bills Yes   Patient Concerns With Medical Bills cost of supplement she finds to be expensive   Medical Bill Referrals: [facility]   Does Patient Have Prescription Coverage? Yes   Home Assistive Devices/Equipment Cane (specify quad or straight); Eyeglasses; Blood pressure cuff; Walker (specify type)   DME Agency NA   HH Agency [facility]   Current home services DME  Social History:                                                                             SDOH Screenings   Food Insecurity: Food Insecurity Present (10/12/2023)  Housing: Low Risk  (10/12/2023)  Transportation Needs: No Transportation Needs (10/12/2023)  Utilities: Not At Risk (10/12/2023)  Alcohol Screen: Low Risk  (10/23/2022)  Depression (PHQ2-9): High Risk (07/24/2023)  Financial Resource Strain: Medium Risk (10/12/2023)  Physical Activity: Inactive (10/23/2022)  Social Connections: Socially Isolated (10/23/2022)  Stress: Stress Concern Present (10/23/2022)  Tobacco Use: Medium Risk (10/11/2023)  Health Literacy: Adequate Health Literacy (10/12/2023)    SDOH Interventions: Financial Resources:  Surveyor, quantity Strain Interventions: Programmer, applications Provided ([facility], [employer] and [employer])  [employer] Insecurity:  Food Insecurity Interventions: Programmer, applications Provided  Housing Insecurity:  Housing Interventions: Programmer, applications Provided  Manufacturing engineer)  Transportation:   Transportation Interventions: Programmer, applications Provided     Other Care Navigation Interventions:     Provided Pharmacy assistance resources  Pt working with medication assistance team with pharmacy for eliquis  Patient expressed Mental Health concerns No.   Follow-up plan:   LCSW will send pt the following: my contact information, Runner, broadcasting/film/video of [employer], [employer] contacts for [facility] in [place]. I also have sent her food resources Secretary/administrator and [employer] card) in case that could be of assistance with costs of living. Defer ongoing medication assistance options to Pharmacy Assistance team. Will f/u and ensure resources have been received.

## 2023-10-12 NOTE — Telephone Encounter (Signed)
 Hi, they sent a prescription to [facility] so that is taken care of. She is set up with medicare on a payment plan but she mentioned the cost of living, insurance and her prescriptions and it being a struggle to manage it all. I am sending to you to see if there is any assistance maybe she could  get to help with one of those items. She is on a very fixed income and is having to really stretch to make all ends meet. I can give more detail if you want to reach out to me. I told her on one of our previous calls I was going to see if there was anything we could help her with. Thank you in advance!

## 2023-10-13 ENCOUNTER — Telehealth: Payer: Self-pay | Admitting: Licensed Clinical Social Worker

## 2023-10-13 ENCOUNTER — Ambulatory Visit: Payer: Self-pay | Admitting: Pulmonary Disease

## 2023-10-13 DIAGNOSIS — M7989 Other specified soft tissue disorders: Secondary | ICD-10-CM | POA: Diagnosis not present

## 2023-10-13 DIAGNOSIS — Z7901 Long term (current) use of anticoagulants: Secondary | ICD-10-CM | POA: Diagnosis not present

## 2023-10-13 DIAGNOSIS — R011 Cardiac murmur, unspecified: Secondary | ICD-10-CM | POA: Diagnosis not present

## 2023-10-13 DIAGNOSIS — I4891 Unspecified atrial fibrillation: Secondary | ICD-10-CM | POA: Diagnosis not present

## 2023-10-13 DIAGNOSIS — R9431 Abnormal electrocardiogram [ECG] [EKG]: Secondary | ICD-10-CM | POA: Diagnosis not present

## 2023-10-13 DIAGNOSIS — R079 Chest pain, unspecified: Secondary | ICD-10-CM | POA: Diagnosis not present

## 2023-10-13 DIAGNOSIS — R000 Tachycardia, unspecified: Secondary | ICD-10-CM | POA: Diagnosis not present

## 2023-10-13 DIAGNOSIS — R600 Localized edema: Secondary | ICD-10-CM | POA: Diagnosis not present

## 2023-10-13 DIAGNOSIS — R0600 Dyspnea, unspecified: Secondary | ICD-10-CM | POA: Diagnosis not present

## 2023-10-13 DIAGNOSIS — R7989 Other specified abnormal findings of blood chemistry: Secondary | ICD-10-CM | POA: Diagnosis not present

## 2023-10-13 DIAGNOSIS — M542 Cervicalgia: Secondary | ICD-10-CM | POA: Diagnosis not present

## 2023-10-13 DIAGNOSIS — I4719 Other supraventricular tachycardia: Secondary | ICD-10-CM | POA: Diagnosis not present

## 2023-10-13 DIAGNOSIS — R519 Headache, unspecified: Secondary | ICD-10-CM | POA: Diagnosis not present

## 2023-10-13 DIAGNOSIS — G8929 Other chronic pain: Secondary | ICD-10-CM | POA: Diagnosis not present

## 2023-10-13 DIAGNOSIS — Z87891 Personal history of nicotine dependence: Secondary | ICD-10-CM | POA: Diagnosis not present

## 2023-10-13 DIAGNOSIS — R110 Nausea: Secondary | ICD-10-CM | POA: Diagnosis not present

## 2023-10-13 NOTE — Telephone Encounter (Signed)
 Noted.

## 2023-10-13 NOTE — Telephone Encounter (Signed)
 Copied from CRM [id number]. Topic: Clinical - Red Word Triage >> Oct 13, 2023 10:48 AM Orinda Kenner C wrote: Red Word that prompted transfer to Nurse Triage: Patient wants to speak Dr. Lanora Manis nurse about Albuterol HFA 90 mcg inhaler. Patient's heart is out of rhythm and the cardiologist will check next week Wednesday, if the heart is not in rhythm they will do a shock treatment in the hospital. Patient does not feel normal, but is under her cardiologist care.  The pharmacist advised Albuterol HFA 90 mcg inhaler is not good with the heart while patient's heart is out of rhythm, use Albuterol HFA 90 mcg inhaler use it sparely and especially combine with Revefenacin (YUPELRI) 175 MCG/3ML nebulizer solution; the pharmacist from [facility] [phone] recommends Levalbuterol. Also, patient states her insurance will not cover Albuterol HFA 90 mcg inhaler anymore. Please advise and call back at [phone].    [facility] [address] [place] [address] [place] [address] Phone: [phone] Fax: [phone]  TRIAGE SUMMARY NOTE: Agent reporting pt getting winded over the phone, not feeling good. Speaking to pt, pt confirms that her heart has been out of rhythm, under care of cardiologist, scheduled for cardioversion next week if heart not back in rhythm by then, looking to use her albuterol inhaler but pharmacist advised against using it while heart out of rhythm. 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 Pt confirms she lives alone, alone at present. Advised pt call 911 to ensure okay. Pt becomes very anxious, reporting that she only called to relay what the pharmacist said, pt reporting she is "really  frightened now," nurse apologized for alarming her but advised that pt get to hospital asap to ensure she is doing okay as this worsening indicates need for urgent assessment with her hx. Pt confirms no chest pain or wheezing. Pt declines offer for nurse to call 911, will have family drive her to ED, advised call 911 if worsening, advised tell pt and/or family to tell ED/911 that she is scheduled for cardioversion soon, irregular heartbeat but having worsening SOB. Pt stated she will try. Offered to call back in 30 min to ensure got ride to hospital, pt declines. Please advise if further recommendations at this time.  E2C2 Pulmonary Triage - Initial Assessment Questions "Chief Complaint (e.g., cough, sob, wheezing, fever, chills, sweat or additional symptoms) *Go to specific symptom protocol after initial questions. Don't feel normal Can't tell you how I feel Don't feel real good Just reporting what pharmacist said about albuterol inhaler, requesting other inhaler Heart out of rhythm More SOB than normal, worsening Told cardiologist, SOB had a long time, could've been out of rhythm a long time, confirms worsening SOB at present Refusing 911, please don't do that, called to report about the drugs Really frightened now, wasn't frightened before Was in the ER with my heart not too long ago before cardio appt, went for headache, heart on 3/12 Saw Dr. Jens Som on 3/19 found heart out of rhythm No chest pain, wheezing Just a tiredness Thinks shock treatment pretty common Have neuropathy real bad in legs and feet and arms On oxycodone for back and neck surgeries, had pain appt yesterday, told them about shock therapy Only  thing a little different is the breathing, worst of anything with irregular heartbeat Gotta get showered and try to find transportation, don't want to call 911, takes them a long time to get there, will get family to take her, advised get to hospital asap Declines call back   "How  long have symptoms been present?" Not sure  "Have you used your inhalers/maintenance medication?" Yes If yes, "What medications?" Maintenance med, pharmacist recommended avoiding albuterol with arrhythmia  Reason for Disposition  Sounds like a life-threatening emergency to the triager  Answer Assessment - Initial Assessment Questions 6. CARDIAC HISTORY: "Do you have any history of heart disease?" (e.g., heart attack, angina, bypass surgery, angioplasty)      significant 7. LUNG HISTORY: "Do you have any history of lung disease?"  (e.g., pulmonary embolus, asthma, emphysema)     Chronic SOB  Protocols used: Breathing Difficulty-A-AH

## 2023-10-13 NOTE — Telephone Encounter (Signed)
 Dr.Dewald FYI

## 2023-10-13 NOTE — Telephone Encounter (Signed)
 H&V Care Navigation CSW Progress Note  Clinical Social Worker contacted patient by phone to f/u again for clarity. Pt housing costs shared with me are different than shared with pharmacy access team. Was able to reach her again today at [phone]. Re-introduced self, role, reason for call. Pt confirms rent is $625 but still is interested in other options. We also discussed again ways to save money such as food pantries/benefits and community [facility] resources to ensure she is in best Part D plan for her needs/costs. Pt agreeable to resource being sent to home address- will do so today.   Pt does share that she was in contact with [employer] who told her that their credit check shows she has more income than she does. She then contacted [employer] in regards to this soft credit check and reported it to BMS as incorrect. Again encouraged her to see what her deductible is and if needed to use Medicare Payment Plan as a way to control costs. Defer additional recommendations to clinical team.  Patient is participating in a Managed Medicaid Plan:  No, Medicare and supplement.   SDOH Screenings   Food Insecurity: Food Insecurity Present (10/12/2023)  Housing: Low Risk  (10/12/2023)  Transportation Needs: No Transportation Needs (10/12/2023)  Utilities: Not At Risk (10/12/2023)  Alcohol Screen: Low Risk  (10/23/2022)  Depression (PHQ2-9): High Risk (07/24/2023)  Financial Resource Strain: Medium Risk (10/12/2023)  Physical Activity: Inactive (10/23/2022)  Social Connections: Socially Isolated (10/23/2022)  Stress: Stress Concern Present (10/23/2022)  Tobacco Use: Medium Risk (10/11/2023)  Health Literacy: Adequate Health Literacy (10/12/2023)    Octavio Graves, MSW, LCSW Clinical Social Worker [facility] Heart/Vascular Care Navigation  [phone]- work cell phone (preferred) [phone]- desk phone

## 2023-10-14 ENCOUNTER — Telehealth: Payer: Self-pay | Admitting: Cardiology

## 2023-10-14 ENCOUNTER — Ambulatory Visit: Payer: Self-pay | Admitting: Pulmonary Disease

## 2023-10-14 DIAGNOSIS — J479 Bronchiectasis, uncomplicated: Secondary | ICD-10-CM

## 2023-10-14 NOTE — Telephone Encounter (Signed)
 Patient identification verified by 2 forms.   Called and spoke to patient  Patient states:  [name] to ED yesterday because pulmonary provider wanted her to  -Pulmonary wants to know which inhaler can the pt use that will not spike her heart rate like albuterol.  -"I'm just real tired after being there all day yesterday"   -I use a nebulizer - Yupelri   Patient denies:  -Current symptoms   Interventions/Plan: -Needs new inhaler but unsure what can be used with "a irregular heart rate'"   Reviewed ED warning signs/precautions  Patient agrees with plan, no questions at this time

## 2023-10-14 NOTE — Telephone Encounter (Signed)
 Reason for Disposition  ??? Caller has already spoken with another triager or PCP AND has further questions AND triager able to answer questions.    Protocols used: No Contact or Duplicate Contact Call-A-AH

## 2023-10-14 NOTE — Telephone Encounter (Addendum)
 E2C2 Nurse Corrie Dandy transferred pt to nurse to touch base again per pt request. Pt reporting she spent the day in the ED yesterday, went right away after speaking to nurse, "they did every test possible and it's on my chart, said everything checked out with my heart," concerned something else wrong with my lungs based on conversation with nurse yesterday, pt asking for clarity with why she was sent to ED, "frightened me yesterday." Pt is very anxious. Nurse reiterated on conversation yesterday, stating that worsening SOB can be related to a lung issue or cardiac issue and since she has an irregular heart rhythm, it was imperative that she be urgently assessed to ensure that the SOB was not a sign of something worse, especially since pt was not able to use rescue inhaler. Pt verbalized understanding. Pt reporting also that her rescue inhaler was not available per insurance so "not using an inhaler at all because not getting it from the drug company." Pt reporting that she is "trying to use the nebulizer all I can but not getting a lot of relief" for SOB. Pt confirms she is not feeling worse than yesterday. Advised pt be examined today with pulm to ensure next steps for acute SOB, also advised pt call cardiologist to see what rescue inhalers he would deem appropriate for her cardiac history since cardio may have different idea than pharmacist and so pt does not have to deal with potential discrepancies between pulm and cardio. Nurse attempted multiple calls to pulm office, no answer by pulm office. Phone call disconnected. Attempted call pt back x2, "call could not be completed as dialed," speaking to pt on 3rd try, pt stating that she is on the line with doc office, asking for me to call her back, pt unsure how to place nurse on hold, pt hung up on nurse. Agent [employer] nurse stating that pt is on the line with her, pt asking to speak with me, pt refusing for agent to transfer pt to nurse, pt requesting for  nurse to call pt back, stating "it took much to start all over," pt hung up on agent. Nurse called pt back again, pt confirms she is "doing okay" and denies struggling to breathe. Pt opting for HP message to be sent to pulm office since waiting on calls from several other doctors as well. Pt requesting call back from pulm office to schedule appt with pulm and to receive alternate inhaler that is safe for her irregular heart rhythm, per conversation with agent yesterday, pt confirms that levalbuterol was recommended by pharmacist and should be covered for her. Please advise.

## 2023-10-14 NOTE — Telephone Encounter (Signed)
 Copied from CRM [id number]. Topic: Clinical - Medical Advice >> Oct 14, 2023  9:54 AM Alcus Dad wrote: Reason for CRM: Patient stated she is still having headaches and needs nurse to give her a call back [phone]

## 2023-10-14 NOTE — Telephone Encounter (Signed)
 Pt has appt 10/15/2023

## 2023-10-14 NOTE — Telephone Encounter (Signed)
 Copied From CRM [id number]. Reason for Triage: Patient requesting to speak with a nurse - states its not an emergency, no symptoms - its in regard to her going to the hospital yesterday.      Patient had called yesterday to see about a medication that her insurance didn't carry. Patient said that yesterday she was advised to go to the Emergency Room Patient said she does not understand why she went to the hospital yesterday. It was explained to the patient that when she expressed feeling worse yesterday than she normally does, it was the safest recommendation at that time. Patient states that she did go to the Emergency Room and she had a lot of tests ran on her. The patient wants to speak to the Triage Nurse that she spoke to yesterday. Call transferred to previous Nurse for patient satisfaction.

## 2023-10-14 NOTE — Telephone Encounter (Signed)
 Patient c/o Palpitations:  STAT if patient reporting lightheadedness, shortness of breath, or chest pain  How long have you had palpitations/irregular HR/ Afib? Are you having the symptoms now? Afib,yes  Are you currently experiencing lightheadedness, SOB or CP? no  Do you have a history of afib (atrial fibrillation) or irregular heart rhythm? yes  Have you checked your BP or HR? (document readings if available): no  Are you experiencing any other symptoms? Yes, was in the ER yesterday

## 2023-10-14 NOTE — Telephone Encounter (Signed)
 Received another denial from BMS

## 2023-10-15 ENCOUNTER — Inpatient Hospital Stay: Admitting: Family Medicine

## 2023-10-15 NOTE — Telephone Encounter (Signed)
 Spoke with pt, according to the notes in her chart, the pulmonary department has recommended levalbuterol. She does not have a follow up with pulmonary until 11/02/23. She goes to her medical doctor on 10/28/23 and she will discuss with her. She is using the nebulizer but does not feel that it helps her as much as the inhaler. Reassurance given to the patient.

## 2023-10-19 MED ORDER — LEVALBUTEROL TARTRATE 45 MCG/ACT IN AERO: 2.0000 | INHALATION_SPRAY | RESPIRATORY_TRACT | Status: DC | PRN

## 2023-10-19 NOTE — Telephone Encounter (Signed)
 I have sent in prescription for levalbuterol inhaler.  JD

## 2023-10-19 NOTE — Telephone Encounter (Signed)
 Copied from CRM [id number]. Topic: Appointments - Scheduling Inquiry for Clinic >> Oct 16, 2023  2:30 PM Crist Dominion wrote: Reason for CRM: Patient is inquiring about her appointment with Dr. Diania Fortes on 4/28 - she needs to be seen sooner as she just got out of the hospital for trouble breathing. Patient is also requesting an inhaler. >> Oct 19, 2023 10:15 AM Manon Seidel J wrote: Patient has been added to the wait list but is requesting an inhaler.   Spoke with patient regarding prior message. Patient stated she has gone to the ED 10/13/2023 patient stated she would like to try a new inhaler Levalbuterol if it will not affect her heart. Patient has been having headache's started on 09/07/2023 and it located on top of her head. Patient stated she had a scan and nothing .   Patient does have a office visit on 11/02/2023 but she doesn't think she can wait until that office visit and would like something sooner since she was seen in the ED on 10/13/2023.   Dr.Dewald can you please advise .  Thank you

## 2023-10-19 NOTE — Addendum Note (Signed)
 Addended by: Ieisha Gao on: 10/19/2023 04:20 PM   Modules accepted: Orders

## 2023-10-20 ENCOUNTER — Telehealth: Payer: Self-pay | Admitting: Cardiology

## 2023-10-20 DIAGNOSIS — R000 Tachycardia, unspecified: Secondary | ICD-10-CM | POA: Diagnosis not present

## 2023-10-20 DIAGNOSIS — I4589 Other specified conduction disorders: Secondary | ICD-10-CM | POA: Diagnosis not present

## 2023-10-20 DIAGNOSIS — R519 Headache, unspecified: Secondary | ICD-10-CM | POA: Diagnosis not present

## 2023-10-20 DIAGNOSIS — G4489 Other headache syndrome: Secondary | ICD-10-CM | POA: Diagnosis not present

## 2023-10-20 DIAGNOSIS — I4719 Other supraventricular tachycardia: Secondary | ICD-10-CM | POA: Diagnosis not present

## 2023-10-20 DIAGNOSIS — R531 Weakness: Secondary | ICD-10-CM | POA: Diagnosis not present

## 2023-10-20 NOTE — Progress Notes (Unsigned)
 Cardiology Office Note    Date:  10/21/2023  ID:  [name], [name] 08/27/1945, MRN [id number] PCP:  Sheliah Hatch, MD  Cardiologist:  Olga Millers, MD  Electrophysiologist:  Regan Lemming, MD   Chief Complaint: Follow up for atrial fibrillation   History of Present Illness: .    [name] is a 78 y.o. female with visit-pertinent history of chronic dyspnea, PAF, previous MV repair, coronary artery disease.  Patient had an echocardiogram in 07/2019 that showed EF 60 to 65%, no RWMA, normal RV systolic function, moderate mitral valve regurgitation with by leaflet prolapse.  She underwent TEE on 11/21/2019 that showed severe mitral valve regurgitation with a flail segment of the anterior leaflet.  Is recommended that she undergo mitral valve repair.  Prior to procedure patient had a right/left heart catheterization on 12/02/2019 that showed mild, nonobstructive CAD with 20% stenosis in the proximal RCA and distal LAD.  She was seen by Dr. Barry Dienes and underwent minimally invasive mitral valve repair, complete maze procedure and sternotomy with repair of left ventricular free wall laceration on 01/17/2020.  Postoperatively she had acute blood loss anemia requiring blood transfusions and A-fib with RVR for which she was placed on amiodarone and Lopressor and Tikosyn was discontinued.  Later amiodarone was discontinued and her A-fib has been managed with Eliquis and metoprolol.  Patient has noted history of chronic dyspnea, echocardiogram from 02/2022 showed EF 50 to 55%, moderately reduced RV systolic function.  There was interventricular septal flattening in systole consistent with RV pressure overload.  Findings were consistent with normal structure and function of mitral valve prosthesis.  Follow-up chest CT in 02/2022 revealed right middle lobe and left upper lobe nodularity suggestive of atypical infection, nodule in the left major fissure.  She is followed by Dr. Francine Graven with  pulmonology.  When seen by cardiology in 10/2022 patient reported frequent nosebleeds while on Eliquis.  She also noted pain in her calves and ankles, underwent ABIs in 11/2022 that were normal.  Echocardiogram on 01/27/2023 indicated LVEF of 55 to 60%, LV with normal function, mild LVH, into her ventricular septum is flattened in systole, consistent with right ventricular pressure overload.  RV systolic function moderately reduced, RV size moderately enlarged, moderately elevated pulmonary artery systolic pressure.  Mitral valve repair/replaced, trivial mitral valve regurgitation, mean gradient 2.0 mmHg with an average heart rate of 60 bpm.  Patient was seen in clinic on 07/07/2023.  At that time patient reported feelings of palpitations and elevated heart rate.  Restarting amiodarone was discussed with patient preferred to continue current treatment and monitor symptoms.  She remained on the Toprol tartrate 25 mg twice daily and Eliquis 5 mg twice daily.  Patient was last seen in clinic on 09/23/2023, she had [facility] by report was in recurrent atrial fibrillation.  Patient was seen by Dr. Jens Som, noted to continue having dyspnea on exertion which was unchanged.  EKG indicated that she was back in atrial fibrillation, heart rate was mildly elevated and blood pressure was borderline.  She was started on amiodarone 200 mg twice a day for 2 weeks then 200 mg daily thereafter with recommendation to proceed with cardioversion in approximately 4 weeks if A-fib persisted.  Her Eliquis was increased to 5 mg twice daily in anticipation of cardioversion.  On 10/09/2023 patient presented reporting shortness of breath, at office visit patient noted that this was a chronic and ongoing issue denied any significant changes.  She  reported that she had not been using her inhalers as a pharmacist instructed her not to use them, is recommended that she discuss with her pulmonologist.  On chart review  patient has presented to [facility] on 4/8 after her pulmonologist office recommended she present to the emergency department given increased shortness of breath, patient reported to the ED that she has chronic shortness of breath.  Patient workup was reassuring including troponins flat and within normal limits x 3.  Patient was discharged in stable condition.  On/15/25 she presented back to [facility] with complaints of a headache that had been ongoing for multiple months, she felt that it had worsened in the last 2 days, patient had undergone MRI on 09/09/2023 with no acute abnormalities.  Patient lab work was overall reassuring and patient noted improvement prior to discharge.  Today she presents for follow-up.  She reports that she has been stable.  She denies any chest pain, notes chronic dyspnea, is followed closely with her pulmonologist and is to receive a new inhaler treatment tomorrow.  She denies any significant changes to her breathing recently.  She denies any palpitations, presyncope or syncope.  She is planning to follow-up with her neurologist tomorrow given recurrent headaches.  She reports adherence to Eliquis and amiodarone, denies any missed doses.  She is eager to proceed with cardioversion.  Labwork independently reviewed: 10/13/23: TSH 1.62, hemoglobin 12.8, hematocrit 40.4, potassium 3.9, sodium 140, creatinine 0.85, AST 29, ALT 19 ROS: .   Today she denies chest pain, lower extremity edema, fatigue, palpitations, melena, hematuria, hemoptysis, diaphoresis, weakness, presyncope, syncope, orthopnea, and PND.  All other systems are reviewed and otherwise negative. Studies Reviewed: Aaron Aas   EKG:  EKG is ordered today, personally reviewed, demonstrating  Atrial tachycardia vs atrial flutter, reviewed with Dr. [place].  CV Studies: Cardiac studies reviewed are outlined and summarized above. Otherwise please see EMR for full report. Cardiac Studies & Procedures    ______________________________________________________________________________________________ CARDIAC CATHETERIZATION  CARDIAC CATHETERIZATION 12/02/2019  Conclusion  Prox RCA lesion is 20% stenosed.  Dist LAD lesion is 20% stenosed.  1. Mild non-obstructive CAD  Recommendation: Continue planning for mitral valve repair.  Findings Coronary Findings Diagnostic  Dominance: Right  Left Anterior Descending Vessel is large. Dist LAD lesion is 20% stenosed.  Left Circumflex Vessel is small.  Right Coronary Artery Vessel is large. Prox RCA lesion is 20% stenosed.  Intervention  No interventions have been documented.     ECHOCARDIOGRAM  ECHOCARDIOGRAM COMPLETE 01/27/2023  Narrative ECHOCARDIOGRAM REPORT    Patient Name:   [name] Date of Exam: 01/27/2023 Medical Rec #:  [id number]   Height:       66.0 in Accession #:    [id number]  Weight:       114.2 lb Date of Birth:  March 30, 1946   BSA:          1.576 m Patient Age:    70 years    BP:           100/60 mmHg Patient Gender: F           HR:           65 bpm. Exam Location:  Outpatient  Procedure: 2D Echo, Cardiac Doppler and Color Doppler  Indications:    S/P Mitral Valve repair Z98.89  History:        Patient has prior history of Echocardiogram examinations, most recent 02/12/2022. Arrythmias:Atrial Fibrillation; Signs/Symptoms:Shortness of Breath.  Mitral Valve: 32 mm Sorin Memo  4D prosthetic annuloplasty ring valve is present in the mitral position. Procedure Date: 01/17/20.  Sonographer:    Ruta Cousins RDCS Referring Phys: [id number] Debria Fang   Sonographer Comments: Image acquisition challenging due to patient body habitus. IMPRESSIONS   1. Left ventricular ejection fraction, by estimation, is 55 to 60%. The left ventricle has normal function. Left ventricular endocardial border not optimally defined to evaluate regional wall motion. There is mild left ventricular hypertrophy.  Left ventricular diastolic function could not be evaluated. There is the interventricular septum is flattened in systole, consistent with right ventricular pressure overload. 2. Right ventricular systolic function is moderately reduced. The right ventricular size is moderately enlarged. There is moderately elevated pulmonary artery systolic pressure. The estimated right ventricular systolic pressure is 48.9 mmHg. 3. Right atrial size was moderately dilated. 4. The mitral valve has been repaired/replaced. Trivial mitral valve regurgitation. The mean mitral valve gradient is 2.0 mmHg with average heart rate of 63 bpm. There is a 32 mm Sorin Memo 4D prosthetic annuloplasty ring present in the mitral position. Procedure Date: 01/17/20. Echo findings are consistent with normal structure and function of the mitral valve prosthesis. 5. Tricuspid valve regurgitation is moderate. 6. The aortic valve is tricuspid. There is mild calcification of the aortic valve. There is mild thickening of the aortic valve. Aortic valve regurgitation is mild. Aortic valve sclerosis is present, with no evidence of aortic valve stenosis. 7. Pulmonic valve regurgitation is moderate. 8. The inferior vena cava is dilated in size with <50% respiratory variability, suggesting right atrial pressure of 15 mmHg.  FINDINGS Left Ventricle: Left ventricular ejection fraction, by estimation, is 55 to 60%. The left ventricle has normal function. Left ventricular endocardial border not optimally defined to evaluate regional wall motion. The left ventricular internal cavity size was normal in size. There is mild left ventricular hypertrophy. The interventricular septum is flattened in systole, consistent with right ventricular pressure overload. Left ventricular diastolic function could not be evaluated.  Right Ventricle: The right ventricular size is moderately enlarged. No increase in right ventricular wall thickness. Right ventricular  systolic function is moderately reduced. There is moderately elevated pulmonary artery systolic pressure. The tricuspid regurgitant velocity is 2.91 m/s, and with an assumed right atrial pressure of 15 mmHg, the estimated right ventricular systolic pressure is 48.9 mmHg.  Left [facility]: Left atrial size was normal in size.  Right [facility]: Right atrial size was moderately dilated.  Pericardium: There is no evidence of pericardial effusion.  Mitral Valve: The mitral valve has been repaired/replaced. Trivial mitral valve regurgitation. There is a 32 mm Sorin Memo 4D prosthetic annuloplasty ring present in the mitral position. Procedure Date: 01/17/20. Echo findings are consistent with normal structure and function of the mitral valve prosthesis. MV peak gradient, 8.8 mmHg. The mean mitral valve gradient is 2.0 mmHg with average heart rate of 63 bpm.  Tricuspid Valve: The tricuspid valve is normal in structure. Tricuspid valve regurgitation is moderate.  Aortic Valve: The aortic valve is tricuspid. There is mild calcification of the aortic valve. There is mild thickening of the aortic valve. Aortic valve regurgitation is mild. Aortic valve sclerosis is present, with no evidence of aortic valve stenosis.  Pulmonic Valve: The pulmonic valve was normal in structure. Pulmonic valve regurgitation is moderate.  Aorta: The aortic root is normal in size and structure.  Venous: The inferior vena cava is dilated in size with less than 50% respiratory variability, suggesting right atrial pressure of 15 mmHg.  IAS/Shunts:  The atrial septum is grossly normal.   LEFT VENTRICLE PLAX 2D LVIDd:         2.90 cm LVIDs:         2.10 cm LV PW:         1.10 cm LV IVS:        1.00 cm LVOT diam:     1.90 cm LV SV:         42 LV SV Index:   27 LVOT Area:     2.84 cm  LV Volumes (MOD) LV vol d, MOD A2C: 59.7 ml LV vol d, MOD A4C: 44.9 ml LV vol s, MOD A2C: 23.1 ml LV vol s, MOD A4C: 14.9 ml LV SV MOD A2C:      36.6 ml LV SV MOD A4C:     44.9 ml LV SV MOD BP:      33.8 ml  RIGHT VENTRICLE RV S prime:     8.99 cm/s TAPSE (M-mode): 1.5 cm  LEFT ATRIUM             Index        RIGHT ATRIUM           Index LA diam:        3.10 cm 1.97 cm/m   RA Area:     20.30 cm LA Vol (A2C):   36.9 ml 23.41 ml/m  RA Volume:   68.10 ml  43.21 ml/m LA Vol (A4C):   40.9 ml 25.95 ml/m LA Biplane Vol: 40.0 ml 25.38 ml/m AORTIC VALVE                   PULMONIC VALVE LVOT Vmax:         70.90 cm/s  PR End Diast Vel: 6.15 msec LVOT Vmean:        43.400 cm/s LVOT VTI:          0.149 m AR Vena Contracta: 0.30 cm  AORTA Ao Root diam: 3.00 cm Ao Asc diam:  3.00 cm  MITRAL VALVE              TRICUSPID VALVE MV Area (PHT): 2.89 cm   TR Peak grad:   33.9 mmHg MV Area VTI:   1.19 cm   TR Vmax:        291.00 cm/s MV Peak grad:  8.8 mmHg MV Mean grad:  2.0 mmHg   SHUNTS MV Vmax:       1.48 m/s   Systemic VTI:  0.15 m MV Vmean:      62.3 cm/s  Systemic Diam: 1.90 cm  Weston Brass MD Electronically signed by Weston Brass MD Signature Date/Time: 01/28/2023/9:53:11 PM    Final   TEE  ECHO TEE 01/26/2020  Narrative TRANSESOPHOGEAL ECHO REPORT    Patient Name:   [name] Date of Exam: 01/26/2020 Medical Rec #:  [id number]   Height:       66.0 in Accession #:    [id number]  Weight:       136.7 lb Date of Birth:  [birth year]   BSA:          1.701 m Patient Age:    74 years    BP:           118/66 mmHg Patient Gender: F           HR:           123 bpm. Exam Location:  Inpatient  Procedure: TEE-Intraopertive and 3D Echo  Indications:     atrial fibrillation  History:         Patient has prior history of Echocardiogram examinations, most recent 11/21/2019. Arrythmias:Atrial Fibrillation.  Mitral Valve: 32 mm prosthetic annuloplasty ring valve is present in the mitral position. Procedure Date: 01/17/2020.  Sonographer:     Delcie Roch Referring Phys:  [id number] Graciella Freer Diagnosing Phys: Lennie Odor MD  PROCEDURE: After discussion of the risks and benefits of a TEE, an informed consent was obtained from the patient. TEE procedure time was 20 minutes. The transesophogeal probe was passed without difficulty through the esophogus of the patient. Imaged were obtained with the patient in a left lateral decubitus position. Local oropharyngeal anesthetic was provided with Cetacaine. Sedation performed by different physician. The patient was monitored while under deep sedation. Anesthestetic sedation was provided intravenously by Anesthesiology: 270mg  of Propofol. Image quality was excellent. The patient's vital signs; including heart rate, blood pressure, and oxygen saturation; remained stable throughout the procedure. The patient developed no complications during the procedure. A successful direct current cardioversion was performed at 200 joules with 1 attempt.  IMPRESSIONS   1. Left ventricular ejection fraction, by estimation, is 55 to 60%. The left ventricle has normal function. The left ventricle has no regional wall motion abnormalities. 2. Right ventricular systolic function is normal. The right ventricular size is normal. 3. S/p surgical LAA clipping. No residual connection noted with the LA. No LA thrombus. No left atrial/left atrial appendage thrombus was detected. 4. 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 No residual MR. MVA by direct 3D MPR assessment 2.6 cm2. The mitral valve has been repaired/replaced. No evidence of mitral valve regurgitation. No evidence of mitral stenosis. The mean mitral valve gradient is 4.0 mmHg with average heart rate of 111 bpm. There is a 32 mm prosthetic annuloplasty ring present in the mitral position. Procedure Date: 01/17/2020. 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 6. The aortic valve is tricuspid. Aortic valve regurgitation is not visualized. No aortic stenosis is present.  Conclusion(s)/Recommendation(s): No  LA/LAA thrombus identified. Successful cardioversion performed with restoration of normal sinus rhythm.  FINDINGS Left Ventricle: Left ventricular ejection fraction, by estimation, is 55 to 60%. The left ventricle has normal function. The left ventricle has no regional wall motion abnormalities. The left ventricular internal cavity size was normal in size. There is no left ventricular hypertrophy.  Right Ventricle: The right ventricular size is normal. No increase in right ventricular wall thickness. Right ventricular systolic function is normal.  Left [facility]: S/p surgical LAA clipping. No residual connection noted with the LA. No LA thrombus. Left atrial size was normal in size. No left atrial/left atrial appendage thrombus was detected.  Right [facility]: Right atrial size was normal in size.  Pericardium: Trivial pericardial effusion is present.  Mitral Valve: S/p MV repair with 32 mm annuloplasty ring. No residual MR. MVA by direct 3D MPR assessment 2.6 cm2. The mitral valve has been repaired/replaced. No evidence of mitral valve regurgitation. There is a 32 mm prosthetic annuloplasty ring present in the mitral position. Procedure Date: 01/17/2020. No evidence of mitral valve stenosis. The mean mitral valve gradient is 4.0 mmHg with average heart rate of 111 bpm.  Tricuspid Valve: The tricuspid valve is myxomatous. Tricuspid valve regurgitation is mild . No evidence of tricuspid stenosis.  Aortic Valve: The aortic valve is tricuspid. Aortic valve regurgitation is not visualized. No aortic stenosis is present.  Pulmonic Valve: The pulmonic valve was grossly normal. Pulmonic valve  regurgitation is mild. No evidence of pulmonic stenosis.  Aorta: The aortic root, ascending aorta, aortic arch and descending aorta are all structurally normal, with no evidence of dilitation or obstruction.  Venous: The left upper pulmonary vein, left lower pulmonary vein and right upper pulmonary vein are  normal.  IAS/Shunts: There is redundancy of the interatrial septum. No atrial level shunt detected by color flow Doppler.  Additional Comments: There is a small pleural effusion in the left lateral region.    AORTA Ao Root diam: 3.20 cm Ao Asc diam:  2.80 cm  MITRAL VALVE           TRICUSPID VALVE MV Mean grad: 4.0 mmHg TR Peak grad:   25.0 mmHg TR Vmax:        250.00 cm/s  Lennie Odor MD Electronically signed by Lennie Odor MD Signature Date/Time: 01/26/2020/11:22:07 AM    Final  MONITORS  LONG TERM MONITOR (3-14 DAYS) 06/06/2019  Narrative Max 226 bpm 06:22pm, 11/15 Min 54 bpm 12:59am, 11/15 Avg 76 bpm Sinus rhythm with occasional runs of SVT, longest 15 seconds at 123 bmp Rare PVCs, 2.2% PACs Zero atrial fibrillation noted Patient trigger associated with sinus rhythm, rate 87  Will Camnitz, MD       ______________________________________________________________________________________________       Current Reported Medications:.    Current Meds  Medication Sig   amiodarone (PACERONE) 200 MG tablet Take 1 tablet by mouth twice daily x 2 weeks then take 1 tablet once daily   apixaban (ELIQUIS) 5 MG TABS tablet Take 1 tablet (5 mg total) by mouth 2 (two) times daily.   Ascorbic Acid (VITAMIN C PO) Take by mouth daily.   b complex vitamins tablet Take 1 tablet by mouth daily.   butalbital-acetaminophen-caffeine (FIORICET) 50-325-40 MG tablet Take 1 tablet by mouth every 6 (six) hours as needed.   Calcium Carbonate Antacid (TUMS CHEWY BITES PO) Take 1 tablet by mouth daily as needed (reflux).    Calcium Citrate-Vitamin D (CALCIUM + D PO) Take 1 tablet by mouth daily.   cephALEXin (KEFLEX) 500 MG capsule Take 2 grams (4 tablets) by mouth 1 hour prior to dental cleaning/procedure.   clonazePAM (KLONOPIN) 0.5 MG tablet Take 1 tablet by mouth twice daily as needed for anxiety   cyanocobalamin (VITAMIN B12) 1000 MCG/ML injection Inject 1,000 mcg into the muscle  every 30 (thirty) days.   denosumab (PROLIA) 60 MG/ML SOSY injection Inject 60 mg into the skin every 6 (six) months.   doxepin (SINEQUAN) 10 MG capsule Take 10 mg by mouth at bedtime.   DULoxetine (CYMBALTA) 60 MG capsule Take 60 mg by mouth daily.   furosemide (LASIX) 40 MG tablet Take 1 tablet (40 mg total) by mouth daily.   levothyroxine (SYNTHROID) 75 MCG tablet Take 1 tablet (75 mcg total) by mouth daily.   metoprolol tartrate (LOPRESSOR) 25 MG tablet Take 1 tablet by mouth twice daily   mupirocin ointment (BACTROBAN) 2 % Apply 1 Application topically daily.   Nutritional Supplements (,FEEDING SUPPLEMENT, PROSOURCE PLUS) liquid Take 30 mLs by mouth 2 (two) times daily between meals.   oxyCODONE (ROXICODONE) 5 MG/5ML solution Take 4 mLs (4 mg total) by mouth every 4 (four) hours as needed for moderate pain.   pantoprazole (PROTONIX) 40 MG tablet Take 1 tablet by mouth once daily   potassium chloride SA (KLOR-CON M) 20 MEQ tablet Take 1 tablet (20 mEq total) by mouth daily.   revefenacin (YUPELRI) 175 MCG/3ML nebulizer solution Take 3  mLs (175 mcg total) by nebulization daily.   VITAMIN D, CHOLECALCIFEROL, PO Take by mouth daily.   Physical Exam:    VS:  BP 110/66 (BP Location: Left Arm, Patient Position: Sitting)   Pulse 87   Ht 5\' 6"  (1.676 m)   Wt 108 lb 3.2 oz (49.1 kg)   SpO2 97%   BMI 17.46 kg/m    Wt Readings from Last 3 Encounters:  10/21/23 108 lb 3.2 oz (49.1 kg)  10/09/23 105 lb (47.6 kg)  09/23/23 103 lb (46.7 kg)    GEN: Well nourished, well developed in no acute distress NECK: No JVD; No carotid bruits CARDIAC: IRR, no murmurs, rubs, gallops RESPIRATORY:  Clear to auscultation without rales, wheezing or rhonchi  ABDOMEN: Soft, non-tender, non-distended EXTREMITIES:  No edema; No acute deformity     Asessement and Plan:.    Persistent atrial fibrillation: Patient with history of PAF, last seen by Dr. Jens Som on 3/19 and was back in recurrent atrial  fibrillation.  She was started amiodarone 2 mg twice daily for 2 weeks then 2 mg daily thereafter with recommendation to proceed with cardioversion in 4 weeks if A-fib persisted.  Eliquis was increased to 5 mg twice daily in anticipation of cardioversion. Reviewed EKG today with Dr. [place], DOD at [facility] office, patient remains in either atrial tachycardia or atrial flutter, recommended proceeding with cardioversion. Discussed with patient, she is agreeable to proceeding, please see consent below.  Patient reports adherence with Eliquis, denies any missed doses.  Check CBC and BMET today.  Patient scheduled for direct-current cardioversion with Dr. Mayford Knife on 10/27/2023 at 1030.  Continue Eliquis 5 mg twice daily, amiodarone 200 mg daily, metoprolol tartrate 25 mg twice daily. Informed Consent   Shared Decision Making/Informed Consent The risks (stroke, cardiac arrhythmias rarely resulting in the need for a temporary or permanent pacemaker, skin irritation or burns and complications associated with conscious sedation including aspiration, arrhythmia, respiratory failure and death), benefits (restoration of normal sinus rhythm) and alternatives of a direct current cardioversion were explained in detail to Ms. Panek and she agrees to proceed.       Hypertension: Blood pressure today 110/66. Continue current antihypertensive regimen.  Chronic HFpEF/chronic dyspnea: Echocardiogram from 01/2023 showed EF 55 to 60%, mild LVH, moderately reduced RV function with moderately elevated PA systolic pressure, moderate TR. She appears euvolemic and well compensated on exam. Patient also follows with pulmonology, has underlying bronchiectasis, pulmonology notes shortness of breath is multifactorial from moderate restriction on PFTs, bronchiectasis and deconditioning.  Patient notes that she is to start on a new inhaler treatment tomorrow.  Chest pain/nonobstructive CAD: Patient has history of atypical chronic chest pain.   Cardiac catheterization 2021 showed mild nonobstructive CAD. Stable with no anginal symptoms. No indication for ischemic evaluation. Not on ASA due to Eliquis use.  Patient has declined statin therapy.  OSA: Patient previously found to have nocturnal hypoxemia on overnight oximetry test in 09/2022.  Patient has declined CPAP therapy.  S/p mitral valve repair: Echo from 01/2023 showed trace mitral regurgitation and no significant mitral stenosis.  32 mm Sorin memo 4D prosthetic annuloplasty ring in the mitral position.  Continue SBE prophylaxis.  Anxiety: At last visit patient noted she is now being followed by a psychiatrist, she has had difficulty with multiple anxiety medications.  Encouraged to continue following up with psychiatrist.    Disposition: F/u with Reather Littler, NP two weeks post cardioversion   Signed, Rip Harbour, NP

## 2023-10-20 NOTE — Telephone Encounter (Signed)
 Pt said she has not slept in two day. She has a headache on top of her head that will not go away. Please advise

## 2023-10-20 NOTE — Telephone Encounter (Signed)
 Patient identification verified by 2 forms. Hilton Lucky, RN    Called and spoke to patient  Patient states:   -she was concerned about her heart   -currently en route to ED, in an ambulance  Advised patient to keep 4/16 OV if discharged today or reschedule if admitted  Patient agrees with plan, no questions at this time

## 2023-10-20 NOTE — H&P (View-Only) (Signed)
 Cardiology Office Note    Date:  10/21/2023  ID:  Regina, [name] April 06, 1946, MRN [id number] PCP:  Jess Morita, MD  Cardiologist:  Alexandria Angel, MD  Electrophysiologist:  Lei Pump, MD   Chief Complaint: Follow up for atrial fibrillation   History of Present Illness: .    [name] is a 78 y.o. female with visit-pertinent history of chronic dyspnea, PAF, previous MV repair, coronary artery disease.  Patient had an echocardiogram in 07/2019 that showed EF 60 to 65%, no RWMA, normal RV systolic function, moderate mitral valve regurgitation with by leaflet prolapse.  She underwent TEE on 11/21/2019 that showed severe mitral valve regurgitation with a flail segment of the anterior leaflet.  Is recommended that she undergo mitral valve repair.  Prior to procedure patient had a right/left heart catheterization on 12/02/2019 that showed mild, nonobstructive CAD with 20% stenosis in the proximal RCA and distal LAD.  She was seen by Dr. Lander Pines and underwent minimally invasive mitral valve repair, complete maze procedure and sternotomy with repair of left ventricular free wall laceration on 01/17/2020.  Postoperatively she had acute blood loss anemia requiring blood transfusions and A-fib with RVR for which she was placed on amiodarone  and Lopressor  and Tikosyn  was discontinued.  Later amiodarone  was discontinued and her A-fib has been managed with Eliquis  and metoprolol .  Patient has noted history of chronic dyspnea, echocardiogram from 02/2022 showed EF 50 to 55%, moderately reduced RV systolic function.  There was interventricular septal flattening in systole consistent with RV pressure overload.  Findings were consistent with normal structure and function of mitral valve prosthesis.  Follow-up chest CT in 02/2022 revealed right middle lobe and left upper lobe nodularity suggestive of atypical infection, nodule in the left major fissure.  She is followed by Dr. Diania Fortes with  pulmonology.  When seen by cardiology in 10/2022 patient reported frequent nosebleeds while on Eliquis .  She also noted pain in her calves and ankles, underwent ABIs in 11/2022 that were normal.  Echocardiogram on 01/27/2023 indicated LVEF of 55 to 60%, LV with normal function, mild LVH, into her ventricular septum is flattened in systole, consistent with right ventricular pressure overload.  RV systolic function moderately reduced, RV size moderately enlarged, moderately elevated pulmonary artery systolic pressure.  Mitral valve repair/replaced, trivial mitral valve regurgitation, mean gradient 2.0 mmHg with an average heart rate of 60 bpm.  Patient was seen in clinic on 07/07/2023.  At that time patient reported feelings of palpitations and elevated heart rate.  Restarting amiodarone  was discussed with patient preferred to continue current treatment and monitor symptoms.  She remained on the Toprol  tartrate 25 mg twice daily and Eliquis  5 mg twice daily.  Patient was last seen in clinic on 09/23/2023, she had [facility] by report was in recurrent atrial fibrillation.  Patient was seen by Dr. Audery Blazing, noted to continue having dyspnea on exertion which was unchanged.  EKG indicated that she was back in atrial fibrillation, heart rate was mildly elevated and blood pressure was borderline.  She was started on amiodarone  200 mg twice a day for 2 weeks then 200 mg daily thereafter with recommendation to proceed with cardioversion in approximately 4 weeks if A-fib persisted.  Her Eliquis  was increased to 5 mg twice daily in anticipation of cardioversion.  On 10/09/2023 patient presented reporting shortness of breath, at office visit patient noted that this was a chronic and ongoing issue denied any significant changes.  She  reported that she had not been using her inhalers as a pharmacist instructed her not to use them, is recommended that she discuss with her pulmonologist.  On chart review  patient has presented to [facility] on 4/8 after her pulmonologist office recommended she present to the emergency department given increased shortness of breath, patient reported to the ED that she has chronic shortness of breath.  Patient workup was reassuring including troponins flat and within normal limits x 3.  Patient was discharged in stable condition.  On/15/25 she presented back to [facility] with complaints of a headache that had been ongoing for multiple months, she felt that it had worsened in the last 2 days, patient had undergone MRI on 09/09/2023 with no acute abnormalities.  Patient lab work was overall reassuring and patient noted improvement prior to discharge.  Today she presents for follow-up.  She reports that she has been stable.  She denies any chest pain, notes chronic dyspnea, is followed closely with her pulmonologist and is to receive a new inhaler treatment tomorrow.  She denies any significant changes to her breathing recently.  She denies any palpitations, presyncope or syncope.  She is planning to follow-up with her neurologist tomorrow given recurrent headaches.  She reports adherence to Eliquis  and amiodarone , denies any missed doses.  She is eager to proceed with cardioversion.  Labwork independently reviewed: 10/13/23: TSH 1.62, hemoglobin 12.8, hematocrit 40.4, potassium 3.9, sodium 140, creatinine 0.85, AST 29, ALT 19 ROS: .   Today she denies chest pain, lower extremity edema, fatigue, palpitations, melena, hematuria, hemoptysis, diaphoresis, weakness, presyncope, syncope, orthopnea, and PND.  All other systems are reviewed and otherwise negative. Studies Reviewed: Aaron Aas   EKG:  EKG is ordered today, personally reviewed, demonstrating  Atrial tachycardia vs atrial flutter, reviewed with Dr. [place].  CV Studies: Cardiac studies reviewed are outlined and summarized above. Otherwise please see EMR for full report. Cardiac Studies & Procedures    ______________________________________________________________________________________________ CARDIAC CATHETERIZATION  CARDIAC CATHETERIZATION 12/02/2019  Conclusion  Prox RCA lesion is 20% stenosed.  Dist LAD lesion is 20% stenosed.  1. Mild non-obstructive CAD  Recommendation: Continue planning for mitral valve repair.  Findings Coronary Findings Diagnostic  Dominance: Right  Left Anterior Descending Vessel is large. Dist LAD lesion is 20% stenosed.  Left Circumflex Vessel is small.  Right Coronary Artery Vessel is large. Prox RCA lesion is 20% stenosed.  Intervention  No interventions have been documented.     ECHOCARDIOGRAM  ECHOCARDIOGRAM COMPLETE 01/27/2023  Narrative ECHOCARDIOGRAM REPORT    Patient Name:   EMARI HREHA Date of Exam: 01/27/2023 Medical Rec #:  [id number]   Height:       66.0 in Accession #:    [id number]  Weight:       114.2 lb Date of Birth:  August 18, 1945   BSA:          1.576 m Patient Age:    54 years    BP:           100/60 mmHg Patient Gender: F           HR:           65 bpm. Exam Location:  Outpatient  Procedure: 2D Echo, Cardiac Doppler and Color Doppler  Indications:    S/P Mitral Valve repair Z98.89  History:        Patient has prior history of Echocardiogram examinations, most recent 02/12/2022. Arrythmias:Atrial Fibrillation; Signs/Symptoms:Shortness of Breath.  Mitral Valve: 32 mm Sorin Memo  4D prosthetic annuloplasty ring valve is present in the mitral position. Procedure Date: 01/17/20.  Sonographer:    Ruta Cousins RDCS Referring Phys: [id number] Debria Fang   Sonographer Comments: Image acquisition challenging due to patient body habitus. IMPRESSIONS   1. Left ventricular ejection fraction, by estimation, is 55 to 60%. The left ventricle has normal function. Left ventricular endocardial border not optimally defined to evaluate regional wall motion. There is mild left ventricular hypertrophy.  Left ventricular diastolic function could not be evaluated. There is the interventricular septum is flattened in systole, consistent with right ventricular pressure overload. 2. Right ventricular systolic function is moderately reduced. The right ventricular size is moderately enlarged. There is moderately elevated pulmonary artery systolic pressure. The estimated right ventricular systolic pressure is 48.9 mmHg. 3. Right atrial size was moderately dilated. 4. The mitral valve has been repaired/replaced. Trivial mitral valve regurgitation. The mean mitral valve gradient is 2.0 mmHg with average heart rate of 63 bpm. There is a 32 mm Sorin Memo 4D prosthetic annuloplasty ring present in the mitral position. Procedure Date: 01/17/20. Echo findings are consistent with normal structure and function of the mitral valve prosthesis. 5. Tricuspid valve regurgitation is moderate. 6. The aortic valve is tricuspid. There is mild calcification of the aortic valve. There is mild thickening of the aortic valve. Aortic valve regurgitation is mild. Aortic valve sclerosis is present, with no evidence of aortic valve stenosis. 7. Pulmonic valve regurgitation is moderate. 8. The inferior vena cava is dilated in size with <50% respiratory variability, suggesting right atrial pressure of 15 mmHg.  FINDINGS Left Ventricle: Left ventricular ejection fraction, by estimation, is 55 to 60%. The left ventricle has normal function. Left ventricular endocardial border not optimally defined to evaluate regional wall motion. The left ventricular internal cavity size was normal in size. There is mild left ventricular hypertrophy. The interventricular septum is flattened in systole, consistent with right ventricular pressure overload. Left ventricular diastolic function could not be evaluated.  Right Ventricle: The right ventricular size is moderately enlarged. No increase in right ventricular wall thickness. Right ventricular  systolic function is moderately reduced. There is moderately elevated pulmonary artery systolic pressure. The tricuspid regurgitant velocity is 2.91 m/s, and with an assumed right atrial pressure of 15 mmHg, the estimated right ventricular systolic pressure is 48.9 mmHg.  Left [facility]: Left atrial size was normal in size.  Right [facility]: Right atrial size was moderately dilated.  Pericardium: There is no evidence of pericardial effusion.  Mitral Valve: The mitral valve has been repaired/replaced. Trivial mitral valve regurgitation. There is a 32 mm Sorin Memo 4D prosthetic annuloplasty ring present in the mitral position. Procedure Date: 01/17/20. Echo findings are consistent with normal structure and function of the mitral valve prosthesis. MV peak gradient, 8.8 mmHg. The mean mitral valve gradient is 2.0 mmHg with average heart rate of 63 bpm.  Tricuspid Valve: The tricuspid valve is normal in structure. Tricuspid valve regurgitation is moderate.  Aortic Valve: The aortic valve is tricuspid. There is mild calcification of the aortic valve. There is mild thickening of the aortic valve. Aortic valve regurgitation is mild. Aortic valve sclerosis is present, with no evidence of aortic valve stenosis.  Pulmonic Valve: The pulmonic valve was normal in structure. Pulmonic valve regurgitation is moderate.  Aorta: The aortic root is normal in size and structure.  Venous: The inferior vena cava is dilated in size with less than 50% respiratory variability, suggesting right atrial pressure of 15 mmHg.  IAS/Shunts:  The atrial septum is grossly normal.   LEFT VENTRICLE PLAX 2D LVIDd:         2.90 cm LVIDs:         2.10 cm LV PW:         1.10 cm LV IVS:        1.00 cm LVOT diam:     1.90 cm LV SV:         42 LV SV Index:   27 LVOT Area:     2.84 cm  LV Volumes (MOD) LV vol d, MOD A2C: 59.7 ml LV vol d, MOD A4C: 44.9 ml LV vol s, MOD A2C: 23.1 ml LV vol s, MOD A4C: 14.9 ml LV SV MOD A2C:      36.6 ml LV SV MOD A4C:     44.9 ml LV SV MOD BP:      33.8 ml  RIGHT VENTRICLE RV S prime:     8.99 cm/s TAPSE (M-mode): 1.5 cm  LEFT ATRIUM             Index        RIGHT ATRIUM           Index LA diam:        3.10 cm 1.97 cm/m   RA Area:     20.30 cm LA Vol (A2C):   36.9 ml 23.41 ml/m  RA Volume:   68.10 ml  43.21 ml/m LA Vol (A4C):   40.9 ml 25.95 ml/m LA Biplane Vol: 40.0 ml 25.38 ml/m AORTIC VALVE                   PULMONIC VALVE LVOT Vmax:         70.90 cm/s  PR End Diast Vel: 6.15 msec LVOT Vmean:        43.400 cm/s LVOT VTI:          0.149 m AR Vena Contracta: 0.30 cm  AORTA Ao Root diam: 3.00 cm Ao Asc diam:  3.00 cm  MITRAL VALVE              TRICUSPID VALVE MV Area (PHT): 2.89 cm   TR Peak grad:   33.9 mmHg MV Area VTI:   1.19 cm   TR Vmax:        291.00 cm/s MV Peak grad:  8.8 mmHg MV Mean grad:  2.0 mmHg   SHUNTS MV Vmax:       1.48 m/s   Systemic VTI:  0.15 m MV Vmean:      62.3 cm/s  Systemic Diam: 1.90 cm  Grady Lawman MD Electronically signed by Grady Lawman MD Signature Date/Time: 01/28/2023/9:53:11 PM    Final   TEE  ECHO TEE 01/26/2020  Narrative TRANSESOPHOGEAL ECHO REPORT    Patient Name:   DEVANSHI CALIFF Date of Exam: 01/26/2020 Medical Rec #:  [id number]   Height:       66.0 in Accession #:    [id number]  Weight:       136.7 lb Date of Birth:  1946-01-23   BSA:          1.701 m Patient Age:    74 years    BP:           118/66 mmHg Patient Gender: F           HR:           123 bpm. Exam Location:  Inpatient  Procedure: TEE-Intraopertive and 3D Echo  Indications:     atrial fibrillation  History:         Patient has prior history of Echocardiogram examinations, most recent 11/21/2019. Arrythmias:Atrial Fibrillation.  Mitral Valve: 32 mm prosthetic annuloplasty ring valve is present in the mitral position. Procedure Date: 01/17/2020.  Sonographer:     Dione Franks Referring Phys:  [id number] Tylene Galla Diagnosing Phys: Jackquelyn Mass MD  PROCEDURE: After discussion of the risks and benefits of a TEE, an informed consent was obtained from the patient. TEE procedure time was 20 minutes. The transesophogeal probe was passed without difficulty through the esophogus of the patient. Imaged were obtained with the patient in a left lateral decubitus position. Local oropharyngeal anesthetic was provided with Cetacaine . Sedation performed by different physician. The patient was monitored while under deep sedation. Anesthestetic sedation was provided intravenously by Anesthesiology: 270mg  of Propofol . Image quality was excellent. The patient's vital signs; including heart rate, blood pressure, and oxygen saturation; remained stable throughout the procedure. The patient developed no complications during the procedure. A successful direct current cardioversion was performed at 200 joules with 1 attempt.  IMPRESSIONS   1. Left ventricular ejection fraction, by estimation, is 55 to 60%. The left ventricle has normal function. The left ventricle has no regional wall motion abnormalities. 2. Right ventricular systolic function is normal. The right ventricular size is normal. 3. S/p surgical LAA clipping. No residual connection noted with the LA. No LA thrombus. No left atrial/left atrial appendage thrombus was detected. 4. 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 No residual MR. MVA by direct 3D MPR assessment 2.6 cm2. The mitral valve has been repaired/replaced. No evidence of mitral valve regurgitation. No evidence of mitral stenosis. The mean mitral valve gradient is 4.0 mmHg with average heart rate of 111 bpm. There is a 32 mm prosthetic annuloplasty ring present in the mitral position. Procedure Date: 01/17/2020. 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 6. The aortic valve is tricuspid. Aortic valve regurgitation is not visualized. No aortic stenosis is present.  Conclusion(s)/Recommendation(s): No  LA/LAA thrombus identified. Successful cardioversion performed with restoration of normal sinus rhythm.  FINDINGS Left Ventricle: Left ventricular ejection fraction, by estimation, is 55 to 60%. The left ventricle has normal function. The left ventricle has no regional wall motion abnormalities. The left ventricular internal cavity size was normal in size. There is no left ventricular hypertrophy.  Right Ventricle: The right ventricular size is normal. No increase in right ventricular wall thickness. Right ventricular systolic function is normal.  Left [facility]: S/p surgical LAA clipping. No residual connection noted with the LA. No LA thrombus. Left atrial size was normal in size. No left atrial/left atrial appendage thrombus was detected.  Right [facility]: Right atrial size was normal in size.  Pericardium: Trivial pericardial effusion is present.  Mitral Valve: S/p MV repair with 32 mm annuloplasty ring. No residual MR. MVA by direct 3D MPR assessment 2.6 cm2. The mitral valve has been repaired/replaced. No evidence of mitral valve regurgitation. There is a 32 mm prosthetic annuloplasty ring present in the mitral position. Procedure Date: 01/17/2020. No evidence of mitral valve stenosis. The mean mitral valve gradient is 4.0 mmHg with average heart rate of 111 bpm.  Tricuspid Valve: The tricuspid valve is myxomatous. Tricuspid valve regurgitation is mild . No evidence of tricuspid stenosis.  Aortic Valve: The aortic valve is tricuspid. Aortic valve regurgitation is not visualized. No aortic stenosis is present.  Pulmonic Valve: The pulmonic valve was grossly normal. Pulmonic valve  regurgitation is mild. No evidence of pulmonic stenosis.  Aorta: The aortic root, ascending aorta, aortic arch and descending aorta are all structurally normal, with no evidence of dilitation or obstruction.  Venous: The left upper pulmonary vein, left lower pulmonary vein and right upper pulmonary vein are  normal.  IAS/Shunts: There is redundancy of the interatrial septum. No atrial level shunt detected by color flow Doppler.  Additional Comments: There is a small pleural effusion in the left lateral region.    AORTA Ao Root diam: 3.20 cm Ao Asc diam:  2.80 cm  MITRAL VALVE           TRICUSPID VALVE MV Mean grad: 4.0 mmHg TR Peak grad:   25.0 mmHg TR Vmax:        250.00 cm/s  Jackquelyn Mass MD Electronically signed by Jackquelyn Mass MD Signature Date/Time: 01/26/2020/11:22:07 AM    Final  MONITORS  LONG TERM MONITOR (3-14 DAYS) 06/06/2019  Narrative Max 226 bpm 06:22pm, 11/15 Min 54 bpm 12:59am, 11/15 Avg 76 bpm Sinus rhythm with occasional runs of SVT, longest 15 seconds at 123 bmp Rare PVCs, 2.2% PACs Zero atrial fibrillation noted Patient trigger associated with sinus rhythm, rate 87  Will Camnitz, MD       ______________________________________________________________________________________________       Current Reported Medications:.    Current Meds  Medication Sig   amiodarone  (PACERONE ) 200 MG tablet Take 1 tablet by mouth twice daily x 2 weeks then take 1 tablet once daily   apixaban  (ELIQUIS ) 5 MG TABS tablet Take 1 tablet (5 mg total) by mouth 2 (two) times daily.   Ascorbic Acid (VITAMIN C PO) Take by mouth daily.   b complex vitamins tablet Take 1 tablet by mouth daily.   butalbital-acetaminophen -caffeine (FIORICET) 50-325-40 MG tablet Take 1 tablet by mouth every 6 (six) hours as needed.   Calcium  Carbonate Antacid (TUMS CHEWY BITES PO) Take 1 tablet by mouth daily as needed (reflux).    Calcium  Citrate-Vitamin D  (CALCIUM  + D PO) Take 1 tablet by mouth daily.   cephALEXin  (KEFLEX ) 500 MG capsule Take 2 grams (4 tablets) by mouth 1 hour prior to dental cleaning/procedure.   clonazePAM  (KLONOPIN ) 0.5 MG tablet Take 1 tablet by mouth twice daily as needed for anxiety   cyanocobalamin  (VITAMIN B12) 1000 MCG/ML injection Inject 1,000 mcg into the muscle  every 30 (thirty) days.   denosumab  (PROLIA ) 60 MG/ML SOSY injection Inject 60 mg into the skin every 6 (six) months.   doxepin (SINEQUAN) 10 MG capsule Take 10 mg by mouth at bedtime.   DULoxetine  (CYMBALTA ) 60 MG capsule Take 60 mg by mouth daily.   furosemide  (LASIX ) 40 MG tablet Take 1 tablet (40 mg total) by mouth daily.   levothyroxine  (SYNTHROID ) 75 MCG tablet Take 1 tablet (75 mcg total) by mouth daily.   metoprolol  tartrate (LOPRESSOR ) 25 MG tablet Take 1 tablet by mouth twice daily   mupirocin ointment (BACTROBAN) 2 % Apply 1 Application topically daily.   Nutritional Supplements (,FEEDING SUPPLEMENT, PROSOURCE PLUS) liquid Take 30 mLs by mouth 2 (two) times daily between meals.   oxyCODONE  (ROXICODONE ) 5 MG/5ML solution Take 4 mLs (4 mg total) by mouth every 4 (four) hours as needed for moderate pain.   pantoprazole  (PROTONIX ) 40 MG tablet Take 1 tablet by mouth once daily   potassium chloride  SA (KLOR-CON  M) 20 MEQ tablet Take 1 tablet (20 mEq total) by mouth daily.   revefenacin  (YUPELRI ) 175 MCG/3ML nebulizer solution Take 3  mLs (175 mcg total) by nebulization daily.   VITAMIN D , CHOLECALCIFEROL, PO Take by mouth daily.   Physical Exam:    VS:  BP 110/66 (BP Location: Left Arm, Patient Position: Sitting)   Pulse 87   Ht 5\' 6"  (1.676 m)   Wt 108 lb 3.2 oz (49.1 kg)   SpO2 97%   BMI 17.46 kg/m    Wt Readings from Last 3 Encounters:  10/21/23 108 lb 3.2 oz (49.1 kg)  10/09/23 105 lb (47.6 kg)  09/23/23 103 lb (46.7 kg)    GEN: Well nourished, well developed in no acute distress NECK: No JVD; No carotid bruits CARDIAC: IRR, no murmurs, rubs, gallops RESPIRATORY:  Clear to auscultation without rales, wheezing or rhonchi  ABDOMEN: Soft, non-tender, non-distended EXTREMITIES:  No edema; No acute deformity     Asessement and Plan:.    Persistent atrial fibrillation: Patient with history of PAF, last seen by Dr. Audery Blazing on 3/19 and was back in recurrent atrial  fibrillation.  She was started amiodarone  2 mg twice daily for 2 weeks then 2 mg daily thereafter with recommendation to proceed with cardioversion in 4 weeks if A-fib persisted.  Eliquis  was increased to 5 mg twice daily in anticipation of cardioversion. Reviewed EKG today with Dr. [place], DOD at [facility] office, patient remains in either atrial tachycardia or atrial flutter, recommended proceeding with cardioversion. Discussed with patient, she is agreeable to proceeding, please see consent below.  Patient reports adherence with Eliquis , denies any missed doses.  Check CBC and BMET today.  Patient scheduled for direct-current cardioversion with Dr. Micael Adas on 10/27/2023 at 1030.  Continue Eliquis  5 mg twice daily, amiodarone  200 mg daily, metoprolol  tartrate 25 mg twice daily. Informed Consent   Shared Decision Making/Informed Consent The risks (stroke, cardiac arrhythmias rarely resulting in the need for a temporary or permanent pacemaker, skin irritation or burns and complications associated with conscious sedation including aspiration, arrhythmia, respiratory failure and death), benefits (restoration of normal sinus rhythm) and alternatives of a direct current cardioversion were explained in detail to Ms. Salberg and she agrees to proceed.       Hypertension: Blood pressure today 110/66. Continue current antihypertensive regimen.  Chronic HFpEF/chronic dyspnea: Echocardiogram from 01/2023 showed EF 55 to 60%, mild LVH, moderately reduced RV function with moderately elevated PA systolic pressure, moderate TR. She appears euvolemic and well compensated on exam. Patient also follows with pulmonology, has underlying bronchiectasis, pulmonology notes shortness of breath is multifactorial from moderate restriction on PFTs, bronchiectasis and deconditioning.  Patient notes that she is to start on a new inhaler treatment tomorrow.  Chest pain/nonobstructive CAD: Patient has history of atypical chronic chest pain.   Cardiac catheterization 2021 showed mild nonobstructive CAD. Stable with no anginal symptoms. No indication for ischemic evaluation. Not on ASA due to Eliquis  use.  Patient has declined statin therapy.  OSA: Patient previously found to have nocturnal hypoxemia on overnight oximetry test in 09/2022.  Patient has declined CPAP therapy.  S/p mitral valve repair: Echo from 01/2023 showed trace mitral regurgitation and no significant mitral stenosis.  32 mm Sorin memo 4D prosthetic annuloplasty ring in the mitral position.  Continue SBE prophylaxis.  Anxiety: At last visit patient noted she is now being followed by a psychiatrist, she has had difficulty with multiple anxiety medications.  Encouraged to continue following up with psychiatrist.    Disposition: F/u with Tanelle Lanzo, NP two weeks post cardioversion   Signed, Edith Lord D Waleska Buttery, NP

## 2023-10-20 NOTE — Telephone Encounter (Signed)
 I called and spoke to pt. Pt informed of Dr Reine Caraway note. Pt verbalized understanding. NFN

## 2023-10-21 ENCOUNTER — Encounter: Payer: Self-pay | Admitting: Cardiology

## 2023-10-21 ENCOUNTER — Ambulatory Visit: Admitting: Cardiology

## 2023-10-21 VITALS — BP 110/66 | HR 87 | Ht 66.0 in | Wt 108.2 lb

## 2023-10-21 DIAGNOSIS — E039 Hypothyroidism, unspecified: Secondary | ICD-10-CM | POA: Diagnosis not present

## 2023-10-21 DIAGNOSIS — G4733 Obstructive sleep apnea (adult) (pediatric): Secondary | ICD-10-CM | POA: Diagnosis not present

## 2023-10-21 DIAGNOSIS — F419 Anxiety disorder, unspecified: Secondary | ICD-10-CM | POA: Diagnosis not present

## 2023-10-21 DIAGNOSIS — M810 Age-related osteoporosis without current pathological fracture: Secondary | ICD-10-CM | POA: Diagnosis not present

## 2023-10-21 DIAGNOSIS — K219 Gastro-esophageal reflux disease without esophagitis: Secondary | ICD-10-CM | POA: Diagnosis not present

## 2023-10-21 DIAGNOSIS — I10 Essential (primary) hypertension: Secondary | ICD-10-CM | POA: Insufficient documentation

## 2023-10-21 DIAGNOSIS — I499 Cardiac arrhythmia, unspecified: Secondary | ICD-10-CM | POA: Diagnosis not present

## 2023-10-21 DIAGNOSIS — R0602 Shortness of breath: Secondary | ICD-10-CM | POA: Insufficient documentation

## 2023-10-21 DIAGNOSIS — R0789 Other chest pain: Secondary | ICD-10-CM | POA: Diagnosis not present

## 2023-10-21 DIAGNOSIS — F32A Depression, unspecified: Secondary | ICD-10-CM | POA: Insufficient documentation

## 2023-10-21 DIAGNOSIS — I4819 Other persistent atrial fibrillation: Secondary | ICD-10-CM | POA: Insufficient documentation

## 2023-10-21 DIAGNOSIS — I5032 Chronic diastolic (congestive) heart failure: Secondary | ICD-10-CM | POA: Insufficient documentation

## 2023-10-21 DIAGNOSIS — I4892 Unspecified atrial flutter: Secondary | ICD-10-CM | POA: Diagnosis not present

## 2023-10-21 DIAGNOSIS — N301 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: Secondary | ICD-10-CM | POA: Diagnosis not present

## 2023-10-21 DIAGNOSIS — Z9889 Other specified postprocedural states: Secondary | ICD-10-CM | POA: Insufficient documentation

## 2023-10-21 DIAGNOSIS — E43 Unspecified severe protein-calorie malnutrition: Secondary | ICD-10-CM | POA: Diagnosis not present

## 2023-10-21 DIAGNOSIS — R911 Solitary pulmonary nodule: Secondary | ICD-10-CM | POA: Diagnosis not present

## 2023-10-21 DIAGNOSIS — G629 Polyneuropathy, unspecified: Secondary | ICD-10-CM | POA: Diagnosis not present

## 2023-10-21 NOTE — Patient Instructions (Signed)
 Medication Instructions:  No chages *If you need a refill on your cardiac medications before your next appointment, please call your pharmacy*  Lab Work: Today we are going to draw a CBC and Bmet If you have labs (blood work) drawn today and your tests are completely normal, you will receive your results only by: MyChart Message (if you have MyChart) OR A paper copy in the mail If you have any lab test that is abnormal or we need to change your treatment, we will call you to review the results.  Testing/Procedures:     Dear [name]  You are scheduled for a Cardioversion on Tuesday, April 22 with Dr. Mayford Knife.  Please arrive at the [facility] (Main Entrance A) at [facility]: [address] [place], [place] [address] at 9:30 AM (This time is 1 hour(s) before your procedure to ensure your preparation).   Free valet parking service is available. You will check in at ADMITTING.   *Please Note: You will receive a call the day before your procedure to confirm the appointment time. That time may have changed from the original time based on the schedule for that day.*    DIET:  Nothing to eat or drink after midnight except a sip of water with medications (see medication instructions below)  MEDICATION INSTRUCTIONS: !!IF ANY NEW MEDICATIONS ARE STARTED AFTER TODAY, PLEASE NOTIFY YOUR PROVIDER AS SOON AS POSSIBLE!!  FYI: Medications such as Semaglutide (Ozempic, [place]), Tirzepatide (Mounjaro, Zepbound), Dulaglutide (Trulicity), etc ("GLP1 agonists") AND Canagliflozin (Invokana), Dapagliflozin (Farxiga), Empagliflozin (Jardiance), Ertugliflozin (Steglatro), Bexagliflozin [employer]) or any combination with one of these drugs such as Invokamet (Canagliflozin/Metformin), Synjardy (Empagliflozin/Metformin), etc ("SGLT2 inhibitors") must be held around the time of a procedure. This is not a comprehensive list of all of these drugs. Please review all of your medications and talk to your  provider if you take any one of these. If you are not sure, ask your provider.   Continue taking your anticoagulant (blood thinner): Apixaban (Eliquis).  You will need to continue this after your procedure until you are told by your provider that it is safe to stop.    LABS: Drawn at appointment  FYI:  For your safety, and to allow [place] to monitor your vital signs accurately during the surgery/procedure we request: If you have artificial nails, gel coating, SNS etc, please have those removed prior to your surgery/procedure. Not having the nail coverings /polish removed may result in cancellation or delay of your surgery/procedure.  Your support person will be asked to wait in the waiting room during your procedure.  It is OK to have someone drop you off and come back when you are ready to be discharged.  You cannot drive after the procedure and will need someone to drive you home.  Bring your insurance cards.  *Special Note: Every effort is made to have your procedure done on time. Occasionally there are emergencies that occur at the hospital that may cause delays. Please be patient if a delay does occur.   Follow-Up: At [facility], you and your health needs are our priority.  As part of our continuing mission to provide you with exceptional heart care, our providers are all part of one team.  This team includes your primary Cardiologist (physician) and Advanced Practice Providers or APPs (Physician Assistants and Nurse Practitioners) who all work together to provide you with the care you need, when you need it.  Your next appointment:   Page 3  We recommend signing up for the patient portal called "MyChart".  Sign up information is provided on this After Visit Summary.  MyChart is used to connect with patients for Virtual Visits (Telemedicine).  Patients are able to view lab/test results, encounter notes, upcoming appointments, etc.  Non-urgent messages can be sent to your provider  as well.   To learn more about what you can do with MyChart, go to [email or web address].   Other Instructions:   1st Floor: - Lobby - Registration  - Pharmacy  - Lab - Cafe  2nd Floor: - PV Lab - Diagnostic Testing (echo, CT, nuclear med)  3rd Floor: - Vacant  4th Floor: - TCTS (cardiothoracic surgery) - AFib Clinic - Structural Heart Clinic - Vascular Surgery  - Vascular Ultrasound  5th Floor: - HeartCare Cardiology (general and EP) - Clinical Pharmacy for coumadin, hypertension, lipid, weight-loss medications, and med management appointments    Valet parking services will be available as well.

## 2023-10-22 ENCOUNTER — Telehealth: Payer: Self-pay | Admitting: Cardiology

## 2023-10-22 DIAGNOSIS — G603 Idiopathic progressive neuropathy: Secondary | ICD-10-CM | POA: Diagnosis not present

## 2023-10-22 DIAGNOSIS — M7912 Myalgia of auxiliary muscles, head and neck: Secondary | ICD-10-CM | POA: Diagnosis not present

## 2023-10-22 DIAGNOSIS — Z760 Encounter for issue of repeat prescription: Secondary | ICD-10-CM | POA: Diagnosis not present

## 2023-10-22 DIAGNOSIS — G5603 Carpal tunnel syndrome, bilateral upper limbs: Secondary | ICD-10-CM | POA: Diagnosis not present

## 2023-10-22 DIAGNOSIS — M7918 Myalgia, other site: Secondary | ICD-10-CM | POA: Diagnosis not present

## 2023-10-22 DIAGNOSIS — R519 Headache, unspecified: Secondary | ICD-10-CM | POA: Diagnosis not present

## 2023-10-22 DIAGNOSIS — Z79899 Other long term (current) drug therapy: Secondary | ICD-10-CM | POA: Diagnosis not present

## 2023-10-22 LAB — CBC
Hematocrit: 39.7 % (ref 34.0–46.6)
Hemoglobin: 13.1 g/dL (ref 11.1–15.9)
MCH: 29.9 pg (ref 26.6–33.0)
MCHC: 33 g/dL (ref 31.5–35.7)
MCV: 91 fL (ref 79–97)
Platelets: 197 10*3/uL (ref 150–450)
RBC: 4.38 x10E6/uL (ref 3.77–5.28)
RDW: 13.1 % (ref 11.7–15.4)
WBC: 11.1 10*3/uL — ABNORMAL HIGH (ref 3.4–10.8)

## 2023-10-22 LAB — BASIC METABOLIC PANEL WITH GFR
BUN/Creatinine Ratio: 24 (ref 12–28)
BUN: 22 mg/dL (ref 8–27)
CO2: 28 mmol/L (ref 20–29)
Calcium: 9.2 mg/dL (ref 8.7–10.3)
Chloride: 100 mmol/L (ref 96–106)
Creatinine, Ser: 0.92 mg/dL (ref 0.57–1.00)
Glucose: 80 mg/dL (ref 70–99)
Potassium: 4.7 mmol/L (ref 3.5–5.2)
Sodium: 145 mmol/L — ABNORMAL HIGH (ref 134–144)
eGFR: 63 mL/min/{1.73_m2} (ref >59)

## 2023-10-22 NOTE — Telephone Encounter (Signed)
Message sent to Pharm D for advice.

## 2023-10-22 NOTE — Telephone Encounter (Signed)
 Pt c/o medication issue:  1. Name of Medication:   Ubrelvy  2. How are you currently taking this medication (dosage and times per day)?     3. Are you having a reaction (difficulty breathing--STAT)?   4. What is your medication issue?   Patient stated she has been having really bad headaches and has been prescribed this medication.  Patient wants to know if this is OK for her to take.

## 2023-10-23 ENCOUNTER — Other Ambulatory Visit: Payer: Self-pay | Admitting: Pulmonary Disease

## 2023-10-23 ENCOUNTER — Other Ambulatory Visit: Payer: Self-pay | Admitting: Family Medicine

## 2023-10-23 DIAGNOSIS — E785 Hyperlipidemia, unspecified: Secondary | ICD-10-CM

## 2023-10-23 NOTE — Telephone Encounter (Signed)
 She needs to reach out to whomever prescribed this.  It's for migraine use, obviously not working well for her

## 2023-10-23 NOTE — Telephone Encounter (Signed)
 Pt calling back to let K.Chad know she started taking this medication. Per pt she was told to report any medication changes before her Echo. Please advise

## 2023-10-23 NOTE — Telephone Encounter (Signed)
 Called and unable to reach patient. Left message  for patient to call office

## 2023-10-26 ENCOUNTER — Other Ambulatory Visit: Payer: Self-pay

## 2023-10-26 ENCOUNTER — Telehealth: Payer: Self-pay | Admitting: Cardiology

## 2023-10-26 ENCOUNTER — Telehealth: Payer: Self-pay

## 2023-10-26 DIAGNOSIS — J479 Bronchiectasis, uncomplicated: Secondary | ICD-10-CM

## 2023-10-26 MED ORDER — LEVALBUTEROL TARTRATE 45 MCG/ACT IN AERO: 2.0000 | INHALATION_SPRAY | RESPIRATORY_TRACT | 6 refills | Status: AC | PRN

## 2023-10-26 NOTE — Telephone Encounter (Signed)
 Copied from CRM [id number]. Topic: Clinical - Prescription Issue >> Oct 26, 2023  3:24 PM Roseanne Cones wrote: Reason for CRM: Patient is calling to advise that the pharmacy let her know that the levalbuterol  (XOPENEX  HFA) 45 MCG/ACT inhaler requires Prior Authorization. The script was received by the pharmacy and she wanted to let the nurses know that's the hold up with the medication currently. Please call the patient when the prior authorization has been completed.

## 2023-10-26 NOTE — Progress Notes (Signed)
 Spoke to patient and instructed them to come at 0915  and to be NPO after 0000.  Medications reviewed.    Confirmed that patient will have a ride home and someone to stay with them for 24 hours after the procedure.

## 2023-10-26 NOTE — Telephone Encounter (Signed)
 Copied from CRM [id number]. Topic: Clinical - Prescription Issue >> Oct 26, 2023  1:05 PM [name] C wrote: Reason for CRM: Patient [phone] states albuterol  is not covered under insurance, and interact with her other medications. Patient states spoke with someone in the office and was told the rx was sent. Patient states [facility] pharmacist does not have medications. Patient is asking for this to be done today, has been calling since last week on this matter and really begging to have it done. Please advise and call back.  [facility] [address] [place] [address] [place] [address] Phone: [phone] Fax: [phone]  Spoke with patient regarding prior message.Advised patient I have seen where Dr.Dewald had sent over xopenex  to patient 's pharmacy on 10/19/2022. Patient stated the pharmacy never received the medication.Advised patient I have resent over another script . Patient is having a procedure tomorrow for Cardioversion and patient is very anxious.Advised patient to call our office back if they do not have the script . Patient's voice was understanding .Nothing else further needed

## 2023-10-26 NOTE — Telephone Encounter (Signed)
 Pt requesting a cb to discuss upcoming procedure-cardioversion, scheduled for tomorrow. No one has reached out to her yet

## 2023-10-26 NOTE — Telephone Encounter (Signed)
 Patient identification verified by 2 forms. Sims Duck, RN     Tried calling patient regarding cardioversion questions. No answer. LVMTCB

## 2023-10-26 NOTE — Telephone Encounter (Signed)
 Copied from CRM [id number]. Topic: Clinical - Prescription Issue >> Oct 23, 2023 11:59 AM [name] wrote: Reason for CRM: Patient call and stated she really needs her inhaler. She called pharmacy and it's not there. Wanted to check with office because she really needs it.  I called and spoke to pt. Been using her albuterol  inhaler but she was told not to use it with her Yuperli. Pt states she called about a inhaler and left several messages, but has not received anything. I informed pt that Xopenex  was called in on 10-19-23 and the pharmacy left a note that it was not picked up yet. Pt stated she would check with her pharmacy and let us  know if there is anymore issues. NFN

## 2023-10-27 ENCOUNTER — Other Ambulatory Visit (HOSPITAL_COMMUNITY): Payer: Self-pay

## 2023-10-27 ENCOUNTER — Encounter (HOSPITAL_COMMUNITY)
Admission: RE | Disposition: A | Discharge status: Home / Self Care | Payer: Self-pay | Source: Home / Self Care | Attending: Cardiology

## 2023-10-27 ENCOUNTER — Ambulatory Visit (HOSPITAL_BASED_OUTPATIENT_CLINIC_OR_DEPARTMENT_OTHER)

## 2023-10-27 ENCOUNTER — Ambulatory Visit

## 2023-10-27 ENCOUNTER — Encounter (HOSPITAL_COMMUNITY): Payer: Self-pay | Admitting: Cardiology

## 2023-10-27 ENCOUNTER — Other Ambulatory Visit: Payer: Self-pay

## 2023-10-27 ENCOUNTER — Ambulatory Visit (HOSPITAL_COMMUNITY)

## 2023-10-27 ENCOUNTER — Ambulatory Visit (HOSPITAL_COMMUNITY)
Admission: RE | Admit: 2023-10-27 | Discharge: 2023-10-27 | Disposition: A | Discharge status: Home / Self Care | Attending: Cardiology | Admitting: Cardiology

## 2023-10-27 ENCOUNTER — Telehealth: Payer: Self-pay

## 2023-10-27 DIAGNOSIS — Z87891 Personal history of nicotine dependence: Secondary | ICD-10-CM | POA: Diagnosis not present

## 2023-10-27 DIAGNOSIS — E039 Hypothyroidism, unspecified: Secondary | ICD-10-CM | POA: Diagnosis not present

## 2023-10-27 DIAGNOSIS — Z79899 Other long term (current) drug therapy: Secondary | ICD-10-CM | POA: Diagnosis not present

## 2023-10-27 DIAGNOSIS — I4819 Other persistent atrial fibrillation: Secondary | ICD-10-CM | POA: Diagnosis not present

## 2023-10-27 DIAGNOSIS — I10 Essential (primary) hypertension: Secondary | ICD-10-CM | POA: Diagnosis not present

## 2023-10-27 DIAGNOSIS — Z7901 Long term (current) use of anticoagulants: Secondary | ICD-10-CM | POA: Insufficient documentation

## 2023-10-27 DIAGNOSIS — G4733 Obstructive sleep apnea (adult) (pediatric): Secondary | ICD-10-CM | POA: Diagnosis not present

## 2023-10-27 DIAGNOSIS — I2510 Atherosclerotic heart disease of native coronary artery without angina pectoris: Secondary | ICD-10-CM | POA: Diagnosis not present

## 2023-10-27 DIAGNOSIS — F419 Anxiety disorder, unspecified: Secondary | ICD-10-CM | POA: Insufficient documentation

## 2023-10-27 DIAGNOSIS — I4891 Unspecified atrial fibrillation: Secondary | ICD-10-CM | POA: Diagnosis not present

## 2023-10-27 DIAGNOSIS — I4892 Unspecified atrial flutter: Secondary | ICD-10-CM

## 2023-10-27 DIAGNOSIS — I484 Atypical atrial flutter: Secondary | ICD-10-CM

## 2023-10-27 DIAGNOSIS — I5032 Chronic diastolic (congestive) heart failure: Secondary | ICD-10-CM | POA: Insufficient documentation

## 2023-10-27 DIAGNOSIS — F418 Other specified anxiety disorders: Secondary | ICD-10-CM | POA: Diagnosis not present

## 2023-10-27 DIAGNOSIS — I110 Hypertensive heart disease with heart failure: Secondary | ICD-10-CM | POA: Insufficient documentation

## 2023-10-27 HISTORY — PX: CARDIOVERSION: EP1203

## 2023-10-27 SURGERY — CARDIOVERSION (CATH LAB)
Anesthesia: General

## 2023-10-27 MED ORDER — PROPOFOL 10 MG/ML IV BOLUS
INTRAVENOUS | Status: DC | PRN
Start: 2023-10-27 — End: 2023-10-27
  Administered 2023-10-27: 30 mg via INTRAVENOUS
  Administered 2023-10-27: 40 mg via INTRAVENOUS

## 2023-10-27 MED ORDER — LIDOCAINE HCL (CARDIAC) PF 100 MG/5ML IV SOSY
PREFILLED_SYRINGE | INTRAVENOUS | Status: DC | PRN
  Administered 2023-10-27: 40 mg via INTRAVENOUS

## 2023-10-27 MED ORDER — SODIUM CHLORIDE 0.9% FLUSH: 3.0000 mL | INTRAVENOUS | Status: DC | PRN

## 2023-10-27 MED ORDER — SODIUM CHLORIDE 0.9% FLUSH: 3.0000 mL | Freq: Two times a day (BID) | INTRAVENOUS | Status: DC

## 2023-10-27 SURGICAL SUPPLY — 1 items: PAD DEFIB RADIO PHYSIO CONN (PAD) ×2 IMPLANT

## 2023-10-27 NOTE — Telephone Encounter (Signed)
 Pharmacy Patient Advocate Encounter   Received notification from RX Request Messages that prior authorization for Levalbuterol  Tartrate 45MCG/ACT aerosol  is required/requested.   Insurance verification completed.   The patient is insured through [employer] .   Per test claim: PA required; PA submitted to above mentioned insurance via CoverMyMeds Key/confirmation #/EOC [id number] Status is pending

## 2023-10-27 NOTE — Telephone Encounter (Signed)
 Amiodarone  can increase the concentration of Ubrelvy. As long as tey start with lowest dose (50mg ) it is fine

## 2023-10-27 NOTE — Transfer of Care (Signed)
 Immediate Anesthesia Transfer of Care Note  Patient: [name]  Procedure(s) Performed: CARDIOVERSION  Patient Location: PACU and Cath Lab  Anesthesia Type:General  Level of Consciousness: awake  Airway & Oxygen Therapy: Patient Spontanous Breathing and Patient connected to nasal cannula oxygen  Post-op Assessment: Report given to RN and Post -op Vital signs reviewed and stable  Post vital signs: stable  Last Vitals:  Vitals Value Taken Time  BP    Temp    Pulse    Resp    SpO2      Last Pain:  Vitals:   10/27/23 0905  TempSrc:   PainSc: 0-No pain         Complications: No notable events documented.

## 2023-10-27 NOTE — Telephone Encounter (Signed)
 Spoke to patient she wanted to know if ok to take Ubrelvy with her heart medications.She will only take if needed.I will send message to our Pharm D for advice.

## 2023-10-27 NOTE — Anesthesia Postprocedure Evaluation (Signed)
 Anesthesia Post Note  Patient: [name]  Procedure(s) Performed: CARDIOVERSION     Patient location during evaluation: PACU Anesthesia Type: General Level of consciousness: awake and alert Pain management: pain level controlled Vital Signs Assessment: post-procedure vital signs reviewed and stable Respiratory status: spontaneous breathing, nonlabored ventilation, respiratory function stable and patient connected to nasal cannula oxygen Cardiovascular status: blood pressure returned to baseline and stable Postop Assessment: no apparent nausea or vomiting Anesthetic complications: no   No notable events documented.  Last Vitals:  Vitals:   10/27/23 0850  BP: 130/85  Pulse: 98  Resp: 14  Temp: (!) 36.3 C  SpO2: 97%    Last Pain:  Vitals:   10/27/23 0905  TempSrc:   PainSc: 0-No pain                 [name]

## 2023-10-27 NOTE — Telephone Encounter (Signed)
 Per chart review patient presented to and completed cardioversion procedure  No further outreach needed at this time

## 2023-10-27 NOTE — Interval H&P Note (Signed)
 History and Physical Interval Note:  10/27/2023 8:49 AM  [name]  has presented today for surgery, with the diagnosis of AFIB.  The various methods of treatment have been discussed with the patient and family. After consideration of risks, benefits and other options for treatment, the patient has consented to  Procedure(s): CARDIOVERSION (N/A) as a surgical intervention.  The patient's history has been reviewed, patient examined, no change in status, stable for surgery.  I have reviewed the patient's chart and labs.  Questions were answered to the patient's satisfaction.     Gaylyn Keas

## 2023-10-27 NOTE — Telephone Encounter (Signed)
 PA Case: [id number], Status: Approved, Coverage Starts on: 07/08/2023 12:00:00 AM, Coverage Ends on: 07/06/2024 12:00:00 AM. Questions? Contact [phone].

## 2023-10-27 NOTE — Telephone Encounter (Signed)
Spoke to patient PharmD's advice given. °

## 2023-10-27 NOTE — Anesthesia Preprocedure Evaluation (Addendum)
 Anesthesia Evaluation  Patient identified by MRN, date of birth, ID band Patient awake    Reviewed: Allergy  & Precautions, NPO status , Patient's Chart, lab work & pertinent test results, reviewed documented beta blocker date and time   History of Anesthesia Complications (+) PONV and history of anesthetic complications  Airway Mallampati: III  TM Distance: <3 FB Neck ROM: Limited    Dental   Pulmonary neg recent URI, former smoker   Pulmonary exam normal        Cardiovascular hypertension, Pt. on home beta blockers and Pt. 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 Left ventricular ejection fraction, by estimation, is 55 to 60%. The  left ventricle has normal function. Left ventricular endocardial border  not optimally defined to evaluate regional wall motion. There is mild left  ventricular hypertrophy. Left  ventricular diastolic function could not be evaluated. There is the  interventricular septum is flattened in systole, consistent with right  ventricular pressure overload.   2. Right ventricular systolic function is moderately reduced. The right  ventricular size is moderately enlarged. There is moderately elevated  pulmonary artery systolic pressure. The estimated right ventricular  systolic pressure is 48.9 mmHg.   3. Right atrial size was moderately dilated.   4. The mitral valve has been repaired/replaced. Trivial mitral valve  regurgitation. The mean mitral valve gradient is 2.0 mmHg with average  heart rate of 63 bpm. There is a 32 mm Sorin Memo 4D prosthetic  annuloplasty ring present in the mitral position.   Procedure Date: 01/17/20. Echo findings are consistent with normal  structure and function of the mitral valve prosthesis.   5. Tricuspid valve regurgitation is moderate.   6. The  aortic valve is tricuspid. There is mild calcification of the  aortic valve. There is mild thickening of the aortic valve. Aortic valve  regurgitation is mild. Aortic valve sclerosis is present, with no evidence  of aortic valve stenosis.   7. Pulmonic valve regurgitation is moderate.   8. The inferior vena cava is dilated in size with <50% respiratory  variability, suggesting right atrial pressure of 15 mmHg.      Neuro/Psych neg Seizures PSYCHIATRIC DISORDERS Anxiety Depression     Neuromuscular disease    GI/Hepatic Neg liver ROS,GERD  Medicated and Controlled,,  Endo/Other  Hypothyroidism    Renal/GU negative Renal ROS     Musculoskeletal  (+) Arthritis ,    Abdominal   Peds  Hematology  (+) Blood dyscrasia, anemia   Anesthesia Other Findings   Reproductive/Obstetrics                              Anesthesia Physical Anesthesia Plan  ASA: 3  Anesthesia Plan: General   Post-op Pain Management:    Induction: Intravenous  PONV Risk Score and Plan: 4 or greater and Treatment may vary due to age or medical condition  Airway Management Planned: Natural Airway, Nasal Cannula and Simple Face Mask  Additional Equipment: None  Intra-op Plan:   Post-operative Plan:   Informed Consent: I have reviewed the patients History and Physical, chart, labs and discussed the procedure including the risks, benefits and alternatives for the proposed anesthesia with the patient or authorized representative who has indicated his/her understanding and acceptance.       Plan Discussed with:   Anesthesia Plan  Comments:          Anesthesia Quick Evaluation

## 2023-10-27 NOTE — CV Procedure (Addendum)
    Electrical Cardioversion Procedure Note [name] [id number] 06/07/1946  Procedure: Electrical Cardioversion Indications: Atypical Atrial Flutter  Time Out: Verified patient identification, verified procedure,medications/allergies/relevent history reviewed, required imaging and test results available.  Performed  Procedure Details  During this procedure the patient is administered a total of Propofol  70 mg and Lidocaine  40 mg to achieve and maintain moderate conscious sedation.  The patient's heart rate, blood pressure, and oxygen saturation are monitored continuously during the procedure. The period of conscious sedation is 8 minutes, of which I was present face-to-face 100% of this time. Regina Mt, CRNA is an independent, trained observer who assisted in the monitoring of the patient's level of consciousness.     Cardioversion was done with synchronized biphasic defibrillation with AP pads with 200watts.  The patient converted to normal sinus rhythm. The patient tolerated the procedure well   IMPRESSION:  Successful cardioversion of atrial flutter    [name] 10/27/2023, 8:51 AM

## 2023-10-28 ENCOUNTER — Inpatient Hospital Stay: Admitting: Family Medicine

## 2023-10-30 DIAGNOSIS — M2042 Other hammer toe(s) (acquired), left foot: Secondary | ICD-10-CM | POA: Diagnosis not present

## 2023-10-30 DIAGNOSIS — M21621 Bunionette of right foot: Secondary | ICD-10-CM | POA: Diagnosis not present

## 2023-10-30 DIAGNOSIS — I872 Venous insufficiency (chronic) (peripheral): Secondary | ICD-10-CM | POA: Diagnosis not present

## 2023-10-30 DIAGNOSIS — L84 Corns and callosities: Secondary | ICD-10-CM | POA: Diagnosis not present

## 2023-10-30 DIAGNOSIS — M2011 Hallux valgus (acquired), right foot: Secondary | ICD-10-CM | POA: Diagnosis not present

## 2023-10-30 DIAGNOSIS — M7742 Metatarsalgia, left foot: Secondary | ICD-10-CM | POA: Diagnosis not present

## 2023-10-30 DIAGNOSIS — M2041 Other hammer toe(s) (acquired), right foot: Secondary | ICD-10-CM | POA: Diagnosis not present

## 2023-10-30 DIAGNOSIS — M2012 Hallux valgus (acquired), left foot: Secondary | ICD-10-CM | POA: Diagnosis not present

## 2023-10-30 DIAGNOSIS — M7741 Metatarsalgia, right foot: Secondary | ICD-10-CM | POA: Diagnosis not present

## 2023-10-30 DIAGNOSIS — I739 Peripheral vascular disease, unspecified: Secondary | ICD-10-CM | POA: Diagnosis not present

## 2023-10-30 DIAGNOSIS — M21622 Bunionette of left foot: Secondary | ICD-10-CM | POA: Diagnosis not present

## 2023-11-02 ENCOUNTER — Ambulatory Visit: Admitting: Pulmonary Disease

## 2023-11-02 ENCOUNTER — Encounter: Payer: Self-pay | Admitting: Pulmonary Disease

## 2023-11-02 VITALS — BP 124/51 | HR 59 | Ht 66.0 in | Wt 107.8 lb

## 2023-11-02 DIAGNOSIS — J479 Bronchiectasis, uncomplicated: Secondary | ICD-10-CM | POA: Diagnosis not present

## 2023-11-02 NOTE — Patient Instructions (Addendum)
 Continue yupelri  nebulizer treatments daily  Use Xopenex  inhaler 1-2 puffs every 4-6 hours as needed  Follow up in 1 year, call sooner if needed

## 2023-11-02 NOTE — Progress Notes (Signed)
 Synopsis: Referred in August 2023 for abnormal CT Chest scan  Subjective:   PATIENT ID: [name] GENDER: female DOB: Nov 30, 1945, MRN: [id number]  HPI  Chief Complaint  Patient presents with   Follow-up    Pt states she been having headaches   [name] is a 78 year old woman with history of mitral regurgitation s/p minimally invassive MV repair and MAZE procedure complicated by LV free wall rupture 10/2019 and scoliosis with 7 spinal surgeries in 2015 who returns to pulmonary clinic for mild bronchiectasis and dyspnea.   She recently had cardioversion for atrial fibrillation. She has not been using her yupelri  nebulizer treatment as she has been dealing with a headache, located on the right posterior side of her head. She has been seeing a neurologist for this.   She recently picked up a xopenex  inhaler. No cough or mucous production. No issues with wheezing. Continues to have exertional dyspnea.  OV 05/26/23 Patient scheduled visit to review her pulmonary issues again. We discussed her bronchiectasis which is mild and reviewed her CT chest scans. We reviewed that she is on yupelri  nebulizer treatments for the bronchiectasis and also she had a significant bronchodilator response on PFTs in the past.   She does complain of troubling swallowing food and drink, more so of recent.  She has recently started seeing a counselor for her anxiety/depression.  OV 02/17/23 She canceled her in lab CPAP titration study due to panic/anxiety.  She expresses concern for her debilitating anxiety/depression and feels that it is worse than ever before.  She has been using Yupelri  nebulizer treatments as needed for her shortness of breath.   OV 09/23/22 She was started on yupelri  nebulizer treatments at last visit in attempt to help with her dyspnea given significant bronchodilator response on PFTs. She noted an improvement in her breathing with the nebulizer treatment.   ONO showed 1 hr 51 minutes  with SpO2 less than 88%. Her oxygen desaturation index was 37 events per hour. Studies have shown that the ODI is a high predictor of sleep disordered breathing such as sleep apnea. Given her medications and these results, I recommended she be scheduled for an in lab sleep study for further evaluation.   She had issue of epistaxis that she went to the ER on 08/18/22 and had packing in for about 4-5 days. This occurred 2-3 weeks after starting the nebulizer treatments. She has had issues with epistaxis in the past.   OV 07/24/22 She continues to have shortness of breath and she feels it is getting worse. She did not tolerate Stiolto inhaler after last visit due to feeling like she had mucous in her throat. She continues to have issues with swallowing and has been referred to speech therapy by her primary care team.  OV 04/02/22 She did not tolerate stiolto after last visit as she reports choking issues after using it.  She had modified barium swallow 03/11/22 with no overt issues of dysphagia or aspiration risk. She reported taking TUMS to the speech therapist for chest pains with relief. She did start pantoprazole  after last visit which she reports some improvement in her reflux.  She continues to have significant chest, neck, shoulder and back pains.   She is meeting with her surgical team at [facility] and is considering botox injections in her neck.  OV 03/05/22 She reports the dyspnea can occur at rest or with exertion. She has wheezing at night sometimes. She complains of sharp chest pains  that are located centrally and goes between her shoulder blades. She has terrible heartburn. She does report trouble swallowing her food/drink. She is using oxycodone  every 4 hours for chronic pain due to her neck and back. She has cervical, thoracic and lumbar spine hardware in place.   PFTs 2022 showed normal spirometry but evidence of air trapping and significant bronchodilator response.   She has not noted  much benefit from trying albuterol  inhaler with her shortness of breath.   She is a former smoker, quit in 1980. She lives alone and is accompanied by her sister today. She is having more difficulty with her ADLs.   Past Medical History:  Diagnosis Date   Allergies    Anxiety    Arthritis    "maybe in my back" (03/31/2018)   Benign paroxysmal positional vertigo 06/08/2013   Complication of anesthesia    Fracture of multiple ribs 2015   "don't know from what; dx'd when I in hospital for 1st back OR" (03/31/2018)   GERD (gastroesophageal reflux disease)    Hair loss 04/12/2012   Herpes    History of blood transfusion    "twice; related to back OR" (03/31/2018)   History of kidney stones    Interstitial cystitis 11/06/2011   Melanoma of ankle (HCC) ~ 2003   "right"   Mitral regurgitation    Osteopenia 02/18/2012   Osteoporosis    PAF (paroxysmal atrial fibrillation) (HCC) 2012   Peripheral neuropathy 11/06/2011   PMDD (premenstrual dysphoric disorder)    PONV (postoperative nausea and vomiting)    nausea, vomiting, hives and dizziness    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/2020   Complete bilateral atrial lesion set using cryothermy and bipolar radiofrequency ablation with clipping of LA appendage via right mini-thoracotomy approach   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   Complex valvuloplasty including artificial Gore-tex neochord placement x12 with 32mm Sorin Memo 4D ring annuloplasty   Seasonal allergies    Vaginal delivery    ONE NSVD   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     Family History  Problem Relation Age of Onset   Diabetes Father    Hyperlipidemia Sister    Heart disease Sister    Stroke Sister    Diabetes Brother    Hyperlipidemia Sister    Heart disease Sister    Arthritis Mother    Heart disease Mother    Uterine cancer Mother    Diabetes Brother    Heart Problems Brother      Social History   Socioeconomic History   Marital status: Divorced     Spouse name: none/divorced   Number of children: Not on file   Years of education: Not on file   Highest education level: 12th grade  Occupational History   Occupation: retired  Tobacco Use   Smoking status: Former    Current packs/day: 0.00    Average packs/day: 0.1 packs/day for 14.0 years (1.4 ttl pk-yrs)    Types: Cigarettes    Start date: 1966    Quit date: 1980    Years since quitting: 45.3    Passive exposure: Past   Smokeless tobacco: Never   Tobacco comments:    03/31/2018 "quit ~ 1980; someday smoker when I did smoke; never addicted"  Vaping Use   Vaping status: Never Used  Substance and Sexual Activity   Alcohol  use: Never   Drug use: Yes    Types: Oxycodone , Benzodiazepines    Comment: 03/31/2018 "for chronic neck and back pain", takes  Klonopin  at times.    Sexual activity: Not Currently  Other Topics Concern   Not on file  Social History Narrative   Lives by herself.    Social Drivers of Health   Financial Resource Strain: Medium Risk (10/12/2023)   Overall Financial Resource Strain (CARDIA)    Difficulty of Paying Living Expenses: Somewhat hard  Food Insecurity: Food Insecurity Present (10/12/2023)   Hunger Vital Sign    Worried About Running Out of Food in the Last Year: Sometimes true    Ran Out of Food in the Last Year: Never true  Transportation Needs: No Transportation Needs (10/12/2023)   PRAPARE - Administrator, Civil Service (Medical): No    Lack of Transportation (Non-Medical): No  Physical Activity: Inactive (10/23/2022)   Exercise Vital Sign    Days of Exercise per Week: 0 days    Minutes of Exercise per Session: 0 min  Stress: Stress Concern Present (10/23/2022)   [employer] of Occupational Health - Occupational Stress Questionnaire    Feeling of Stress : To some extent  Social Connections: Socially Isolated (10/23/2022)   Social Connection and Isolation Panel [NHANES]    Frequency of Communication with Friends and Family: More  than three times a week    Frequency of Social Gatherings with Friends and Family: Once a week    Attends Religious Services: Never    Database administrator or Organizations: No    Attends Banker Meetings: Never    Marital Status: Divorced  Catering manager Violence: Not At Risk (10/13/2023)   Received from [facility]   HITS    Over the last 12 months how often did your partner physically hurt you?: Never    Over the last 12 months how often did your partner insult you or talk down to you?: Never    Over the last 12 months how often did your partner threaten you with physical harm?: Never    Over the last 12 months how often did your partner scream or curse at you?: Never     Allergies  Allergen Reactions   Buprenorphine Nausea Only and Other (See Comments)    sedation and adhesive reaction--patches    Erythromycin Nausea And Vomiting and Other (See Comments)    Dizziness     Iodinated Contrast Media Hives, Nausea Only and Rash   Latex Palpitations, Rash and Other (See Comments)    Hives/sores   Other Other (See Comments)    CAN NOT TAKE , aLPRAZOLAM, OR ELAVIL DUE TO AFIB FOR ANXIETY   Steroids-"jittery/hyper" "wants to crawl out of skin"   Tape Hives, Itching, Rash and Other (See Comments)    Heart monitor stickers must be rotated in order to prevent rash Adhesive on EKG tabs=BURNS SKIN**PAPER TAPE OK** sores     Ciprofloxacin  Other (See Comments)    Burning all over   Duloxetine  Hcl Other (See Comments)    Severe diarrhea and upset stomach   Gabapentin  Other (See Comments)    Unknown--unsteady gait/felt intoxicated   Hydrocodone Other (See Comments)    Sedation,dizziness, and nausea   Hydromorphone  Nausea Only and Other (See Comments)    Sedation,dizziness, and nausea   Iodine Hives   Levofloxacin Nausea And Vomiting and Nausea Only   Metrizamide Hives   Nsaids     CANNOT TAKE PER CARDIOLOGIST DUE TO AFIB    Nucynta [Tapentadol Hcl] Nausea And  Vomiting and Other (See Comments)    nausea and sedation  Oxycodone  Itching, Nausea Only and Other (See Comments)    Delusions (intolerance)  PILLS ONLY sedation, dizziness, nausea,  and itching   Pentazocine Other (See Comments)    Unknown    Pregabalin Nausea Only    Unknown    Sulfasalazine Hives   Morphine Nausea Only, Anxiety and Other (See Comments)    sedation and nausea delusions (intolerance) PT CANNOT WAKE UP AFTER TAKING MEDICATION AND HAS NIGHTMARES     Sulfa Antibiotics Hives, Nausea Only and Rash   Sulfamethoxazole-Trimethoprim Hives and Rash     Outpatient Medications Prior to Visit  Medication Sig Dispense Refill   acetaminophen  (TYLENOL ) 500 MG tablet Take 500 mg by mouth every 6 (six) hours as needed (pain.).     amiodarone  (PACERONE ) 200 MG tablet Take 1 tablet by mouth twice daily x 2 weeks then take 1 tablet once daily (Patient taking differently: Take 200 mg by mouth in the morning.) 104 tablet 3   apixaban  (ELIQUIS ) 5 MG TABS tablet Take 1 tablet (5 mg total) by mouth 2 (two) times daily. 180 tablet 1   calcium  carbonate (OS-CAL) 1250 (500 Ca) MG chewable tablet Chew 1 tablet by mouth every evening.     Calcium  Carbonate Antacid (TUMS CHEWY BITES PO) Take 1 tablet by mouth daily as needed (reflux).      Carboxymethylcellul-Glycerin  (LUBRICATING EYE DROPS OP) Place 1 drop into both eyes 3 (three) times daily as needed (dry/irritated eys.).     cephALEXin  (KEFLEX ) 500 MG capsule Take 2 grams (4 tablets) by mouth 1 hour prior to dental cleaning/procedure. 4 capsule 2   Cholecalciferol (VITAMIN D -3 PO) Take 1 tablet by mouth every evening.     clonazePAM  (KLONOPIN ) 0.5 MG tablet Take 1 tablet by mouth twice daily as needed for anxiety (Patient taking differently: Take 0.125-0.25 mg by mouth 2 (two) times daily as needed for anxiety.) 30 tablet 3   cyanocobalamin  (VITAMIN B12) 1000 MCG/ML injection Inject 1,000 mcg into the muscle every 30 (thirty) days.     denosumab   (PROLIA ) 60 MG/ML SOSY injection Inject 60 mg into the skin every 6 (six) months.     furosemide  (LASIX ) 20 MG tablet Take 20 mg by mouth every Tuesday, Thursday, and Saturday at 6 PM.     furosemide  (LASIX ) 40 MG tablet Take 1 tablet (40 mg total) by mouth daily.     levalbuterol  (XOPENEX  HFA) 45 MCG/ACT inhaler Inhale 2 puffs into the lungs every 4 (four) hours as needed for wheezing. 1 each 6   levothyroxine  (SYNTHROID ) 75 MCG tablet Take 1 tablet by mouth once daily 90 tablet 0   loratadine (CLARITIN) 10 MG tablet Take 10 mg by mouth in the morning.     meclizine  (ANTIVERT ) 25 MG tablet TAKE 1 TABLET BY MOUTH THREE TIMES DAILY AS NEEDED FOR DIZZINESS 45 tablet 0   metoprolol  tartrate (LOPRESSOR ) 25 MG tablet Take 1 tablet by mouth twice daily 180 tablet 3   mupirocin ointment (BACTROBAN) 2 % Apply 1 Application topically daily.     nystatin  (MYCOSTATIN /NYSTOP ) powder Apply topically 2 (two) times daily as needed (skin irritation (under breasts)). 15 g 0   ondansetron  (ZOFRAN ) 8 MG tablet Take 1 tablet (8 mg total) by mouth every 8 (eight) hours as needed for nausea or vomiting. 45 tablet 1   oxyCODONE  (ROXICODONE ) 5 MG/5ML solution Take 4 mLs (4 mg total) by mouth every 4 (four) hours as needed for moderate pain. (Patient taking differently: Take 5 mg by mouth  every 4 (four) hours as needed (pain.).)  0   pantoprazole  (PROTONIX ) 40 MG tablet Take 1 tablet by mouth once daily 30 tablet 3   polyethylene glycol (MIRALAX  / GLYCOLAX ) packet Take 17 g by mouth daily as needed for mild constipation.     potassium chloride  SA (KLOR-CON  M) 20 MEQ tablet Take 1 tablet (20 mEq total) by mouth daily. (Patient taking differently: Take 20 mEq by mouth every evening.) 90 tablet 3   revefenacin  (YUPELRI ) 175 MCG/3ML nebulizer solution Take 3 mLs (175 mcg total) by nebulization daily. 90 mL 11   sodium chloride  (OCEAN) 0.65 % SOLN nasal spray Place 1 spray into both nostrils as needed for congestion (nose  bleeds).     Ubrogepant (UBRELVY) 100 MG TABS Take 25 mg by mouth daily as needed (headaches/migraines).     valACYclovir  (VALTREX ) 500 MG tablet Take 500 mg by mouth daily as needed (breakouts).     No facility-administered medications prior to visit.   Review of Systems  Constitutional:  Negative for chills, fever, malaise/fatigue and weight loss.  HENT:  Negative for congestion, sinus pain and sore throat.   Eyes: Negative.   Respiratory:  Positive for shortness of breath. Negative for cough, hemoptysis, sputum production and wheezing.   Cardiovascular:  Negative for chest pain, palpitations, orthopnea, claudication and leg swelling.  Gastrointestinal:  Negative for abdominal pain, heartburn, nausea and vomiting.  Genitourinary: Negative.   Musculoskeletal:  Negative for joint pain and myalgias.  Skin:  Negative for rash.  Neurological:  Negative for weakness.  Endo/Heme/Allergies: Negative.   Psychiatric/Behavioral:  The patient is nervous/anxious.    Objective:   Vitals:   11/02/23 1311  BP: (!) 124/51  Pulse: (!) 59  SpO2: 92%  Weight: 107 lb 12.8 oz (48.9 kg)  Height: 5\' 6"  (1.676 m)     Physical Exam Constitutional:      General: She is not in acute distress.    Appearance: She is not ill-appearing.  HENT:     Head: Normocephalic and atraumatic.  Eyes:     General: No scleral icterus. Cardiovascular:     Rate and Rhythm: Normal rate and regular rhythm.     Pulses: Normal pulses.     Heart sounds: Normal heart sounds. No murmur heard. Pulmonary:     Effort: Pulmonary effort is normal.     Breath sounds: Normal breath sounds. No wheezing, rhonchi or rales.  Musculoskeletal:     Right lower leg: No edema.     Left lower leg: No edema.  Skin:    General: Skin is warm and dry.  Neurological:     General: No focal deficit present.     Mental Status: She is alert.    CBC    Component Value Date/Time   WBC 11.1 (H) 10/21/2023 1617   WBC 5.6 09/07/2023 1253    RBC 4.38 10/21/2023 1617   RBC 4.15 09/07/2023 1253   HGB 13.1 10/21/2023 1617   HCT 39.7 10/21/2023 1617   PLT 197 10/21/2023 1617   MCV 91 10/21/2023 1617   MCH 29.9 10/21/2023 1617   MCH 29.6 09/07/2023 1253   MCHC 33.0 10/21/2023 1617   MCHC 31.8 09/07/2023 1253   RDW 13.1 10/21/2023 1617   LYMPHSABS 0.8 09/07/2023 1253   MONOABS 0.4 09/07/2023 1253   EOSABS 0.1 09/07/2023 1253   BASOSABS 0.0 09/07/2023 1253      Latest Ref Rng & Units 10/21/2023    4:17 PM 09/07/2023  12:53 PM 07/28/2023    9:55 AM  BMP  Glucose 70 - 99 mg/dL 80  94  70   BUN 8 - 27 mg/dL 22  16  20    Creatinine 0.57 - 1.00 mg/dL 8.11  9.14  7.82   BUN/Creat Ratio 12 - 28 24     Sodium 134 - 144 mmol/L 145  136  142   Potassium 3.5 - 5.2 mmol/L 4.7  4.2  4.7   Chloride 96 - 106 mmol/L 100  99  100   CO2 20 - 29 mmol/L 28  25  35   Calcium  8.7 - 10.3 mg/dL 9.2  9.0  9.1    Chest imaging: CXR 12/23/22 1. Evidence of prior mitral valve replacement and left atrial appendage clipping. 2. Moderate severity chronic appearing increased interstitial lung markings without acute or active cardiopulmonary disease.  CT Chest 02/13/22 1. Right middle lobe and left upper lobe tree-in-bud nodularity consistent with atypical infection. 2. Interval development of a 6x4 mm subpleural nodule along the left major fissure. No follow-up needed if patient is low-risk.This recommendation follows the consensus statement: Guidelines for Management of Incidental Pulmonary Nodules Detected on CT Images: From the Fleischner Society 2017; Radiology 2017; 284:228-243. 3. Cardiomegaly. 4. Mitral valve replacement.  PFT:    Latest Ref Rng & Units 01/01/2021   11:49 AM 12/01/2019    9:39 AM  PFT Results  FVC-Pre L 1.55  2.13   FVC-Predicted Pre % 51  69   FVC-Post L 1.67    FVC-Predicted Post % 55    Pre FEV1/FVC % % 74  74   Post FEV1/FCV % % 81    FEV1-Pre L 1.15  1.57   FEV1-Predicted Pre % 50  68   FEV1-Post L 1.36     DLCO uncorrected ml/min/mmHg 21.48  14.09   DLCO UNC% % 105  68   DLCO corrected ml/min/mmHg 22.12  14.56   DLCO COR %Predicted % 108  71   DLVA Predicted % 110  105   TLC L 4.50  5.36   TLC % Predicted % 84  100   RV % Predicted % 131  140     Labs:  Path:  Echo 02/11/22: LV EF 50-55%. RV systolic function is moderately reduced. RV size is moderately enlarged. Mildly elevated pulmonary artery systolic pressure 44.69mmHg. RA mildly dilated.  Heart Catheterization:  Modified barium Swallow 06/12/23 Clinical Impression: Clinical Impression: Patient presents with worsening swallow function compared to prior MBS approx 15 months ago - mild dysphagia.  She demonstrates decreased laryngeal elevation/closure and worsening tongue base retraction allowing laryngeal penetration and retention. Severe retention noted with cracker in pharynx - with pt sensing due to dry swallow helping to decrease to mild amount.  Suspect this is largely a source of pt's "choking" with solids requiring her to expectorate. Did not test tablet due to pt stating it was too large to swallow.  As pt has xerostomia, recommend she start all intake with liquids,  swallow twice with solids, follow solid with liquids,intermittently cough/throat clear and re-swallow.  In addition, recommended she keep her expectoration and cough ability strong.   Given worsening swallow function compared to last year, SLP recommends follow up SLP for strengthening to improve pharynbeal clearance/laryngeal elevation/closure via lingual press exercise.  Could not test chin tuck due to amount of hardware pt has in her cervical spine.  Thanks for this consult.  Assessment & Plan:   Bronchiectasis without complication (HCC)  Discussion: Sabena Gismondi is a 78 year old woman with history of mitral regurgitation s/p minimally invassive MV repair and MAZE procedure complicated by LV free wall rupture 10/2019 and scoliosis with 7 spinal surgeries in 2015 who  returns to pulmonary clinic for shortness of breath and bronchiectasis.  Bronchiectasis - continue yupelri  nebs daily - use xopenex  1-2 puffs every 4-6 hours as needed.  Moderate Obstructive Sleep apnea - continues with nocturnal oxygen  Dypsphagia - last MBS 06/2023  Follow up in 1 year.  Duaine German, MD [facility] Office: [phone]    Current Outpatient Medications:    acetaminophen  (TYLENOL ) 500 MG tablet, Take 500 mg by mouth every 6 (six) hours as needed (pain.)., Disp: , Rfl:    amiodarone  (PACERONE ) 200 MG tablet, Take 1 tablet by mouth twice daily x 2 weeks then take 1 tablet once daily (Patient taking differently: Take 200 mg by mouth in the morning.), Disp: 104 tablet, Rfl: 3   apixaban  (ELIQUIS ) 5 MG TABS tablet, Take 1 tablet (5 mg total) by mouth 2 (two) times daily., Disp: 180 tablet, Rfl: 1   calcium  carbonate (OS-CAL) 1250 (500 Ca) MG chewable tablet, Chew 1 tablet by mouth every evening., Disp: , Rfl:    Calcium  Carbonate Antacid (TUMS CHEWY BITES PO), Take 1 tablet by mouth daily as needed (reflux). , Disp: , Rfl:    Carboxymethylcellul-Glycerin  (LUBRICATING EYE DROPS OP), Place 1 drop into both eyes 3 (three) times daily as needed (dry/irritated eys.)., Disp: , Rfl:    cephALEXin  (KEFLEX ) 500 MG capsule, Take 2 grams (4 tablets) by mouth 1 hour prior to dental cleaning/procedure., Disp: 4 capsule, Rfl: 2   Cholecalciferol (VITAMIN D -3 PO), Take 1 tablet by mouth every evening., Disp: , Rfl:    clonazePAM  (KLONOPIN ) 0.5 MG tablet, Take 1 tablet by mouth twice daily as needed for anxiety (Patient taking differently: Take 0.125-0.25 mg by mouth 2 (two) times daily as needed for anxiety.), Disp: 30 tablet, Rfl: 3   cyanocobalamin  (VITAMIN B12) 1000 MCG/ML injection, Inject 1,000 mcg into the muscle every 30 (thirty) days., Disp: , Rfl:    denosumab  (PROLIA ) 60 MG/ML SOSY injection, Inject 60 mg into the skin every 6 (six) months., Disp: ,  Rfl:    furosemide  (LASIX ) 20 MG tablet, Take 20 mg by mouth every Tuesday, Thursday, and Saturday at 6 PM., Disp: , Rfl:    furosemide  (LASIX ) 40 MG tablet, Take 1 tablet (40 mg total) by mouth daily., Disp: , Rfl:    levalbuterol  (XOPENEX  HFA) 45 MCG/ACT inhaler, Inhale 2 puffs into the lungs every 4 (four) hours as needed for wheezing., Disp: 1 each, Rfl: 6   levothyroxine  (SYNTHROID ) 75 MCG tablet, Take 1 tablet by mouth once daily, Disp: 90 tablet, Rfl: 0   loratadine (CLARITIN) 10 MG tablet, Take 10 mg by mouth in the morning., Disp: , Rfl:    meclizine  (ANTIVERT ) 25 MG tablet, TAKE 1 TABLET BY MOUTH THREE TIMES DAILY AS NEEDED FOR DIZZINESS, Disp: 45 tablet, Rfl: 0   metoprolol  tartrate (LOPRESSOR ) 25 MG tablet, Take 1 tablet by mouth twice daily, Disp: 180 tablet, Rfl: 3   mupirocin ointment (BACTROBAN) 2 %, Apply 1 Application topically daily., Disp: , Rfl:    nystatin  (MYCOSTATIN /NYSTOP ) powder, Apply topically 2 (two) times daily as needed (skin irritation (under breasts))., Disp: 15 g, Rfl: 0   ondansetron  (ZOFRAN ) 8 MG tablet, Take 1 tablet (8 mg total) by mouth every 8 (eight) hours as  needed for nausea or vomiting., Disp: 45 tablet, Rfl: 1   oxyCODONE  (ROXICODONE ) 5 MG/5ML solution, Take 4 mLs (4 mg total) by mouth every 4 (four) hours as needed for moderate pain. (Patient taking differently: Take 5 mg by mouth every 4 (four) hours as needed (pain.).), Disp: , Rfl: 0   pantoprazole  (PROTONIX ) 40 MG tablet, Take 1 tablet by mouth once daily, Disp: 30 tablet, Rfl: 3   polyethylene glycol (MIRALAX  / GLYCOLAX ) packet, Take 17 g by mouth daily as needed for mild constipation., Disp: , Rfl:    potassium chloride  SA (KLOR-CON  M) 20 MEQ tablet, Take 1 tablet (20 mEq total) by mouth daily. (Patient taking differently: Take 20 mEq by mouth every evening.), Disp: 90 tablet, Rfl: 3   revefenacin  (YUPELRI ) 175 MCG/3ML nebulizer solution, Take 3 mLs (175 mcg total) by nebulization daily., Disp: 90  mL, Rfl: 11   sodium chloride  (OCEAN) 0.65 % SOLN nasal spray, Place 1 spray into both nostrils as needed for congestion (nose bleeds)., Disp: , Rfl:    Ubrogepant (UBRELVY) 100 MG TABS, Take 25 mg by mouth daily as needed (headaches/migraines)., Disp: , Rfl:    valACYclovir  (VALTREX ) 500 MG tablet, Take 500 mg by mouth daily as needed (breakouts)., Disp: , Rfl:   -

## 2023-11-03 ENCOUNTER — Telehealth: Payer: Self-pay | Admitting: Pharmacy Technician

## 2023-11-03 NOTE — Telephone Encounter (Signed)
 More information was sent in and it was denied again saying not met the 3% out of pocket

## 2023-11-04 DIAGNOSIS — E43 Unspecified severe protein-calorie malnutrition: Secondary | ICD-10-CM | POA: Diagnosis not present

## 2023-11-04 DIAGNOSIS — H52203 Unspecified astigmatism, bilateral: Secondary | ICD-10-CM | POA: Diagnosis not present

## 2023-11-04 DIAGNOSIS — E039 Hypothyroidism, unspecified: Secondary | ICD-10-CM | POA: Diagnosis not present

## 2023-11-04 DIAGNOSIS — M810 Age-related osteoporosis without current pathological fracture: Secondary | ICD-10-CM | POA: Diagnosis not present

## 2023-11-04 DIAGNOSIS — K219 Gastro-esophageal reflux disease without esophagitis: Secondary | ICD-10-CM | POA: Diagnosis not present

## 2023-11-04 DIAGNOSIS — R911 Solitary pulmonary nodule: Secondary | ICD-10-CM | POA: Diagnosis not present

## 2023-11-04 DIAGNOSIS — H35372 Puckering of macula, left eye: Secondary | ICD-10-CM | POA: Diagnosis not present

## 2023-11-04 DIAGNOSIS — I499 Cardiac arrhythmia, unspecified: Secondary | ICD-10-CM | POA: Diagnosis not present

## 2023-11-04 DIAGNOSIS — G629 Polyneuropathy, unspecified: Secondary | ICD-10-CM | POA: Diagnosis not present

## 2023-11-04 DIAGNOSIS — I10 Essential (primary) hypertension: Secondary | ICD-10-CM | POA: Diagnosis not present

## 2023-11-04 DIAGNOSIS — N301 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: Secondary | ICD-10-CM | POA: Diagnosis not present

## 2023-11-04 DIAGNOSIS — H40023 Open angle with borderline findings, high risk, bilateral: Secondary | ICD-10-CM | POA: Diagnosis not present

## 2023-11-04 DIAGNOSIS — H2513 Age-related nuclear cataract, bilateral: Secondary | ICD-10-CM | POA: Diagnosis not present

## 2023-11-05 DIAGNOSIS — M7912 Myalgia of auxiliary muscles, head and neck: Secondary | ICD-10-CM | POA: Diagnosis not present

## 2023-11-05 DIAGNOSIS — Z760 Encounter for issue of repeat prescription: Secondary | ICD-10-CM | POA: Diagnosis not present

## 2023-11-05 DIAGNOSIS — Z79899 Other long term (current) drug therapy: Secondary | ICD-10-CM | POA: Diagnosis not present

## 2023-11-05 DIAGNOSIS — M7918 Myalgia, other site: Secondary | ICD-10-CM | POA: Diagnosis not present

## 2023-11-05 DIAGNOSIS — G5603 Carpal tunnel syndrome, bilateral upper limbs: Secondary | ICD-10-CM | POA: Diagnosis not present

## 2023-11-05 DIAGNOSIS — R519 Headache, unspecified: Secondary | ICD-10-CM | POA: Diagnosis not present

## 2023-11-05 DIAGNOSIS — G603 Idiopathic progressive neuropathy: Secondary | ICD-10-CM | POA: Diagnosis not present

## 2023-11-08 NOTE — Progress Notes (Unsigned)
 Cardiology Office Note    Date:  11/11/2023  ID:  Regina, [name] [birth year], MRN [id number] PCP:  Jess Morita, MD  Cardiologist:  Alexandria Angel, MD  Electrophysiologist:  Lei Pump, MD   Chief Complaint: Follow up for atrial fibrillation   History of Present Illness: .    [name] is a 78 y.o. female with visit-pertinent history of chronic dyspnea, PAF, previous MV repair, coronary artery disease.  Patient had an echocardiogram in 07/2019 that showed EF 60 to 65%, no RWMA, normal RV systolic function, moderate mitral valve regurgitation with by leaflet prolapse.  She underwent TEE on 11/21/2019 that showed severe mitral valve regurgitation with a flail segment of the anterior leaflet.  Is recommended that she undergo mitral valve repair.  Prior to procedure patient had a right/left heart catheterization on 12/02/2019 that showed mild, nonobstructive CAD with 20% stenosis in the proximal RCA and distal LAD.  She was seen by Dr. Lander Pines and underwent minimally invasive mitral valve repair, complete maze procedure and sternotomy with repair of left ventricular free wall laceration on 01/17/2020.  Postoperatively she had acute blood loss anemia requiring blood transfusions and A-fib with RVR for which she was placed on amiodarone  and Lopressor  and Tikosyn  was discontinued.  Later amiodarone  was discontinued and her A-fib has been managed with Eliquis  and metoprolol .  Patient has noted history of chronic dyspnea, echocardiogram from 02/2022 showed EF 50 to 55%, moderately reduced RV systolic function.  There was interventricular septal flattening in systole consistent with RV pressure overload.  Findings were consistent with normal structure and function of mitral valve prosthesis.  Follow-up chest CT in 02/2022 revealed right middle lobe and left upper lobe nodularity suggestive of atypical infection, nodule in the left major fissure.  She is followed by Dr. Diania Fortes with  pulmonology.  When seen by cardiology in 10/2022 patient reported frequent nosebleeds while on Eliquis .  She also noted pain in her calves and ankles, underwent ABIs in 11/2022 that were normal.  Echocardiogram on 01/27/2023 indicated LVEF of 55 to 60%, LV with normal function, mild LVH, into her ventricular septum is flattened in systole, consistent with right ventricular pressure overload.  RV systolic function moderately reduced, RV size moderately enlarged, moderately elevated pulmonary artery systolic pressure.  Mitral valve repair/replaced, trivial mitral valve regurgitation, mean gradient 2.0 mmHg with an average heart rate of 60 bpm.  Patient was seen in clinic on 07/07/2023.  At that time patient reported feelings of palpitations and elevated heart rate.  Restarting amiodarone  was discussed with patient preferred to continue current treatment and monitor symptoms.  She remained on the Toprol  tartrate 25 mg twice daily and Eliquis  5 mg twice daily.  Patient was last seen in clinic on 09/23/2023, she had [facility] by report was in recurrent atrial fibrillation.  Patient was seen by Dr. Audery Blazing, noted to continue having dyspnea on exertion which was unchanged.  EKG indicated that she was back in atrial fibrillation, heart rate was mildly elevated and blood pressure was borderline.  She was started on amiodarone  200 mg twice a day for 2 weeks then 200 mg daily thereafter with recommendation to proceed with cardioversion in approximately 4 weeks if A-fib persisted.  Her Eliquis  was increased to 5 mg twice daily in anticipation of cardioversion.  On 10/09/2023 patient presented reporting shortness of breath, at office visit patient noted that this was a chronic and ongoing issue denied any significant changes.  She  reported that she had not been using her inhalers as a pharmacist instructed her not to use them, is recommended that she discuss with her pulmonologist.  On chart review  patient has presented to [facility] on 4/8 after her pulmonologist office recommended she present to the emergency department given increased shortness of breath, patient reported to the ED that she has chronic shortness of breath.  Patient workup was reassuring including troponins flat and within normal limits x 3.  Patient was discharged in stable condition.  On/15/25 she presented back to [facility] with complaints of a headache that had been ongoing for multiple months, she felt that it had worsened in the last 2 days, patient had undergone MRI on 09/09/2023 with no acute abnormalities.  Patient lab work was overall reassuring and patient noted improvement prior to discharge.   Patient was last in clinic on/16/2025.  She remained in atrial fibrillation versus atrial tachycardia, was scheduled for cardioversion.  Patient underwent cardioversion on/22/25 that was successful.  Today she presents for follow-up.  She reports that she has been doing well from a cardiac standpoint. She denies any palpitations, she feels her breathing has improved, she has increased lower extremity edema.  She denies any chest pain, orthopnea or PND.  She notes that she feels significantly better following her cardioversion.  She reports that she is still tolerating her medications well, denies any increased bleeding on Eliquis .  She continues to note intermittent headaches, is planning to follow-up with her PCP regarding this.  Patient endorses increased lower extremity edema at her feet specifically.  Notes that she has been taking her Lasix  as prescribed however did not take it this morning as she was coming for her appointment.  On further discussion patient notes that she has recently been eating out regularly, mainly consisting of [place] foods as well as eating microwavable meals at home.  Labwork independently reviewed: 10/21/23: Sodium 145, potassium 4.7, creatinine 0.92, hemoglobin 13.1, hematocrit 39.7 ROS: .    Today she denies chest pain, shortness of breath, lower extremity edema, fatigue, palpitations, melena, hematuria, hemoptysis, diaphoresis, weakness, presyncope, syncope, orthopnea, and PND.  All other systems are reviewed and otherwise negative. Studies Reviewed: Aaron Aas    EKG:  EKG is ordered today, personally reviewed, demonstrating  EKG Interpretation Date/Time:  Tuesday Nov 10 2023 13:59:52 EDT Ventricular Rate:  55 PR Interval:  184 QRS Duration:  78 QT Interval:  436 QTC Calculation: 417 R Axis:   113  Text Interpretation: Sinus bradycardia Low voltage QRS Nonspecific T wave abnormality Confirmed by Carrisa Keller [phone]) on 11/10/2023 2:05:31 PM   CV Studies: Cardiac studies reviewed are outlined and summarized above. Otherwise please see EMR for full report. Cardiac Studies & Procedures   ______________________________________________________________________________________________ CARDIAC CATHETERIZATION  CARDIAC CATHETERIZATION 12/02/2019  Conclusion  Prox RCA lesion is 20% stenosed.  Dist LAD lesion is 20% stenosed.  1. Mild non-obstructive CAD  Recommendation: Continue planning for mitral valve repair.  Findings Coronary Findings Diagnostic  Dominance: Right  Left Anterior Descending Vessel is large. Dist LAD lesion is 20% stenosed.  Left Circumflex Vessel is small.  Right Coronary Artery Vessel is large. Prox RCA lesion is 20% stenosed.  Intervention  No interventions have been documented.     ECHOCARDIOGRAM  ECHOCARDIOGRAM COMPLETE 01/27/2023  Narrative ECHOCARDIOGRAM REPORT    Patient Name:   VASHTI REDINGTON Date of Exam: 01/27/2023 Medical Rec #:  [id number]   Height:       66.0 in  Accession #:    [id number]  Weight:       114.2 lb Date of Birth:  10-11-1945   BSA:          1.576 m Patient Age:    69 years    BP:           100/60 mmHg Patient Gender: F           HR:           65 bpm. Exam Location:  Outpatient  Procedure: 2D Echo, Cardiac Doppler  and Color Doppler  Indications:    S/P Mitral Valve repair Z98.89  History:        Patient has prior history of Echocardiogram examinations, most recent 02/12/2022. Arrythmias:Atrial Fibrillation; Signs/Symptoms:Shortness of Breath.  Mitral Valve: 32 mm Sorin Memo 4D prosthetic annuloplasty ring valve is present in the mitral position. Procedure Date: 01/17/20.  Sonographer:    Ruta Cousins RDCS Referring Phys: [id number] Debria Fang   Sonographer Comments: Image acquisition challenging due to patient body habitus. IMPRESSIONS   1. Left ventricular ejection fraction, by estimation, is 55 to 60%. The left ventricle has normal function. Left ventricular endocardial border not optimally defined to evaluate regional wall motion. There is mild left ventricular hypertrophy. Left ventricular diastolic function could not be evaluated. There is the interventricular septum is flattened in systole, consistent with right ventricular pressure overload. 2. Right ventricular systolic function is moderately reduced. The right ventricular size is moderately enlarged. There is moderately elevated pulmonary artery systolic pressure. The estimated right ventricular systolic pressure is 48.9 mmHg. 3. Right atrial size was moderately dilated. 4. The mitral valve has been repaired/replaced. Trivial mitral valve regurgitation. The mean mitral valve gradient is 2.0 mmHg with average heart rate of 63 bpm. There is a 32 mm Sorin Memo 4D prosthetic annuloplasty ring present in the mitral position. Procedure Date: 01/17/20. Echo findings are consistent with normal structure and function of the mitral valve prosthesis. 5. Tricuspid valve regurgitation is moderate. 6. The aortic valve is tricuspid. There is mild calcification of the aortic valve. There is mild thickening of the aortic valve. Aortic valve regurgitation is mild. Aortic valve sclerosis is present, with no evidence of aortic valve stenosis. 7. Pulmonic  valve regurgitation is moderate. 8. The inferior vena cava is dilated in size with <50% respiratory variability, suggesting right atrial pressure of 15 mmHg.  FINDINGS Left Ventricle: Left ventricular ejection fraction, by estimation, is 55 to 60%. The left ventricle has normal function. Left ventricular endocardial border not optimally defined to evaluate regional wall motion. The left ventricular internal cavity size was normal in size. There is mild left ventricular hypertrophy. The interventricular septum is flattened in systole, consistent with right ventricular pressure overload. Left ventricular diastolic function could not be evaluated.  Right Ventricle: The right ventricular size is moderately enlarged. No increase in right ventricular wall thickness. Right ventricular systolic function is moderately reduced. There is moderately elevated pulmonary artery systolic pressure. The tricuspid regurgitant velocity is 2.91 m/s, and with an assumed right atrial pressure of 15 mmHg, the estimated right ventricular systolic pressure is 48.9 mmHg.  Left [facility]: Left atrial size was normal in size.  Right [facility]: Right atrial size was moderately dilated.  Pericardium: There is no evidence of pericardial effusion.  Mitral Valve: The mitral valve has been repaired/replaced. Trivial mitral valve regurgitation. There is a 32 mm Sorin Memo 4D prosthetic annuloplasty ring present in the mitral position. Procedure Date: 01/17/20. Echo findings are  consistent with normal structure and function of the mitral valve prosthesis. MV peak gradient, 8.8 mmHg. The mean mitral valve gradient is 2.0 mmHg with average heart rate of 63 bpm.  Tricuspid Valve: The tricuspid valve is normal in structure. Tricuspid valve regurgitation is moderate.  Aortic Valve: The aortic valve is tricuspid. There is mild calcification of the aortic valve. There is mild thickening of the aortic valve. Aortic valve regurgitation is mild.  Aortic valve sclerosis is present, with no evidence of aortic valve stenosis.  Pulmonic Valve: The pulmonic valve was normal in structure. Pulmonic valve regurgitation is moderate.  Aorta: The aortic root is normal in size and structure.  Venous: The inferior vena cava is dilated in size with less than 50% respiratory variability, suggesting right atrial pressure of 15 mmHg.  IAS/Shunts: The atrial septum is grossly normal.   LEFT VENTRICLE PLAX 2D LVIDd:         2.90 cm LVIDs:         2.10 cm LV PW:         1.10 cm LV IVS:        1.00 cm LVOT diam:     1.90 cm LV SV:         42 LV SV Index:   27 LVOT Area:     2.84 cm  LV Volumes (MOD) LV vol d, MOD A2C: 59.7 ml LV vol d, MOD A4C: 44.9 ml LV vol s, MOD A2C: 23.1 ml LV vol s, MOD A4C: 14.9 ml LV SV MOD A2C:     36.6 ml LV SV MOD A4C:     44.9 ml LV SV MOD BP:      33.8 ml  RIGHT VENTRICLE RV S prime:     8.99 cm/s TAPSE (M-mode): 1.5 cm  LEFT ATRIUM             Index        RIGHT ATRIUM           Index LA diam:        3.10 cm 1.97 cm/m   RA Area:     20.30 cm LA Vol (A2C):   36.9 ml 23.41 ml/m  RA Volume:   68.10 ml  43.21 ml/m LA Vol (A4C):   40.9 ml 25.95 ml/m LA Biplane Vol: 40.0 ml 25.38 ml/m AORTIC VALVE                   PULMONIC VALVE LVOT Vmax:         70.90 cm/s  PR End Diast Vel: 6.15 msec LVOT Vmean:        43.400 cm/s LVOT VTI:          0.149 m AR Vena Contracta: 0.30 cm  AORTA Ao Root diam: 3.00 cm Ao Asc diam:  3.00 cm  MITRAL VALVE              TRICUSPID VALVE MV Area (PHT): 2.89 cm   TR Peak grad:   33.9 mmHg MV Area VTI:   1.19 cm   TR Vmax:        291.00 cm/s MV Peak grad:  8.8 mmHg MV Mean grad:  2.0 mmHg   SHUNTS MV Vmax:       1.48 m/s   Systemic VTI:  0.15 m MV Vmean:      62.3 cm/s  Systemic Diam: 1.90 cm  Grady Lawman MD Electronically signed by Grady Lawman MD Signature Date/Time: 01/28/2023/9:53:11 PM  Final   TEE  ECHO TEE  01/26/2020  Narrative TRANSESOPHOGEAL ECHO REPORT    Patient Name:   SHANITTA PICO Date of Exam: 01/26/2020 Medical Rec #:  [id number]   Height:       66.0 in Accession #:    [id number]  Weight:       136.7 lb Date of Birth:  Apr 27, 1946   BSA:          1.701 m Patient Age:    74 years    BP:           118/66 mmHg Patient Gender: F           HR:           123 bpm. Exam Location:  Inpatient  Procedure: TEE-Intraopertive and 3D Echo  Indications:     atrial fibrillation  History:         Patient has prior history of Echocardiogram examinations, most recent 11/21/2019. Arrythmias:Atrial Fibrillation.  Mitral Valve: 32 mm prosthetic annuloplasty ring valve is present in the mitral position. Procedure Date: 01/17/2020.  Sonographer:     Dione Franks Referring Phys:  [id number] Tylene Galla Diagnosing Phys: Jackquelyn Mass MD  PROCEDURE: After discussion of the risks and benefits of a TEE, an informed consent was obtained from the patient. TEE procedure time was 20 minutes. The transesophogeal probe was passed without difficulty through the esophogus of the patient. Imaged were obtained with the patient in a left lateral decubitus position. Local oropharyngeal anesthetic was provided with Cetacaine . Sedation performed by different physician. The patient was monitored while under deep sedation. Anesthestetic sedation was provided intravenously by Anesthesiology: 270mg  of Propofol . Image quality was excellent. The patient's vital signs; including heart rate, blood pressure, and oxygen saturation; remained stable throughout the procedure. The patient developed no complications during the procedure. A successful direct current cardioversion was performed at 200 joules with 1 attempt.  IMPRESSIONS   1. Left ventricular ejection fraction, by estimation, is 55 to 60%. The left ventricle has normal function. The left ventricle has no regional wall motion abnormalities. 2. Right ventricular  systolic function is normal. The right ventricular size is normal. 3. S/p surgical LAA clipping. No residual connection noted with the LA. No LA thrombus. No left atrial/left atrial appendage thrombus was detected. 4. 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 No residual MR. MVA by direct 3D MPR assessment 2.6 cm2. The mitral valve has been repaired/replaced. No evidence of mitral valve regurgitation. No evidence of mitral stenosis. The mean mitral valve gradient is 4.0 mmHg with average heart rate of 111 bpm. There is a 32 mm prosthetic annuloplasty ring present in the mitral position. Procedure Date: 01/17/2020. 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 6. The aortic valve is tricuspid. Aortic valve regurgitation is not visualized. No aortic stenosis is present.  Conclusion(s)/Recommendation(s): No LA/LAA thrombus identified. Successful cardioversion performed with restoration of normal sinus rhythm.  FINDINGS Left Ventricle: Left ventricular ejection fraction, by estimation, is 55 to 60%. The left ventricle has normal function. The left ventricle has no regional wall motion abnormalities. The left ventricular internal cavity size was normal in size. There is no left ventricular hypertrophy.  Right Ventricle: The right ventricular size is normal. No increase in right ventricular wall thickness. Right ventricular systolic function is normal.  Left [facility]: S/p surgical LAA clipping. No residual connection noted with the LA. No LA thrombus. Left atrial size was normal in size. No left atrial/left atrial appendage thrombus was  detected.  Right [facility]: Right atrial size was normal in size.  Pericardium: Trivial pericardial effusion is present.  Mitral Valve: S/p MV repair with 32 mm annuloplasty ring. No residual MR. MVA by direct 3D MPR assessment 2.6 cm2. The mitral valve has been repaired/replaced. No evidence of mitral valve regurgitation. There is a 32 mm prosthetic annuloplasty ring present in  the mitral position. Procedure Date: 01/17/2020. No evidence of mitral valve stenosis. The mean mitral valve gradient is 4.0 mmHg with average heart rate of 111 bpm.  Tricuspid Valve: The tricuspid valve is myxomatous. Tricuspid valve regurgitation is mild . No evidence of tricuspid stenosis.  Aortic Valve: The aortic valve is tricuspid. Aortic valve regurgitation is not visualized. No aortic stenosis is present.  Pulmonic Valve: The pulmonic valve was grossly normal. Pulmonic valve regurgitation is mild. No evidence of pulmonic stenosis.  Aorta: The aortic root, ascending aorta, aortic arch and descending aorta are all structurally normal, with no evidence of dilitation or obstruction.  Venous: The left upper pulmonary vein, left lower pulmonary vein and right upper pulmonary vein are normal.  IAS/Shunts: There is redundancy of the interatrial septum. No atrial level shunt detected by color flow Doppler.  Additional Comments: There is a small pleural effusion in the left lateral region.    AORTA Ao Root diam: 3.20 cm Ao Asc diam:  2.80 cm  MITRAL VALVE           TRICUSPID VALVE MV Mean grad: 4.0 mmHg TR Peak grad:   25.0 mmHg TR Vmax:        250.00 cm/s  Jackquelyn Mass MD Electronically signed by Jackquelyn Mass MD Signature Date/Time: 01/26/2020/11:22:07 AM    Final        ______________________________________________________________________________________________       Current Reported Medications:.    Current Meds  Medication Sig   acetaminophen  (TYLENOL ) 500 MG tablet Take 500 mg by mouth every 6 (six) hours as needed (pain.).   amiodarone  (PACERONE ) 200 MG tablet Take 200 mg by mouth daily.   apixaban  (ELIQUIS ) 5 MG TABS tablet Take 1 tablet (5 mg total) by mouth 2 (two) times daily.   calcium  carbonate (OS-CAL) 1250 (500 Ca) MG chewable tablet Chew 1 tablet by mouth every evening.   Calcium  Carbonate Antacid (TUMS CHEWY BITES PO) Take 1 tablet by mouth daily as  needed (reflux).    Carboxymethylcellul-Glycerin  (LUBRICATING EYE DROPS OP) Place 1 drop into both eyes 3 (three) times daily as needed (dry/irritated eys.).   cephALEXin  (KEFLEX ) 500 MG capsule Take 2 grams (4 tablets) by mouth 1 hour prior to dental cleaning/procedure.   Cholecalciferol (VITAMIN D -3 PO) Take 1 tablet by mouth every evening.   clonazePAM  (KLONOPIN ) 0.5 MG tablet Take 1 tablet by mouth twice daily as needed for anxiety (Patient taking differently: Take 0.125-0.25 mg by mouth 2 (two) times daily as needed for anxiety.)   cyanocobalamin  (VITAMIN B12) 1000 MCG/ML injection Inject 1,000 mcg into the muscle every 30 (thirty) days.   denosumab  (PROLIA ) 60 MG/ML SOSY injection Inject 60 mg into the skin every 6 (six) months.   furosemide  (LASIX ) 20 MG tablet Take 20 mg by mouth every Tuesday, Thursday, and Saturday at 6 PM. Per patient taking 60 mg on these days   furosemide  (LASIX ) 40 MG tablet Take 1 tablet (40 mg total) by mouth daily.   levalbuterol  (XOPENEX  HFA) 45 MCG/ACT inhaler Inhale 2 puffs into the lungs every 4 (four) hours as needed for wheezing.   levothyroxine  (SYNTHROID )  75 MCG tablet Take 1 tablet by mouth once daily   loratadine (CLARITIN) 10 MG tablet Take 10 mg by mouth in the morning.   meclizine  (ANTIVERT ) 25 MG tablet TAKE 1 TABLET BY MOUTH THREE TIMES DAILY AS NEEDED FOR DIZZINESS   metoprolol  tartrate (LOPRESSOR ) 25 MG tablet Take 1 tablet by mouth twice daily   mupirocin ointment (BACTROBAN) 2 % Apply 1 Application topically daily.   nystatin  (MYCOSTATIN /NYSTOP ) powder Apply topically 2 (two) times daily as needed (skin irritation (under breasts)).   ondansetron  (ZOFRAN ) 8 MG tablet Take 1 tablet (8 mg total) by mouth every 8 (eight) hours as needed for nausea or vomiting.   oxyCODONE  (ROXICODONE ) 5 MG/5ML solution Take 4 mLs (4 mg total) by mouth every 4 (four) hours as needed for moderate pain. (Patient taking differently: Take 5 mg by mouth every 4 (four) hours  as needed (pain.).)   pantoprazole  (PROTONIX ) 40 MG tablet Take 1 tablet by mouth once daily   polyethylene glycol (MIRALAX  / GLYCOLAX ) packet Take 17 g by mouth daily as needed for mild constipation.   potassium chloride  SA (KLOR-CON  M) 20 MEQ tablet Take 1 tablet (20 mEq total) by mouth daily. (Patient taking differently: Take 20 mEq by mouth every evening.)   revefenacin  (YUPELRI ) 175 MCG/3ML nebulizer solution Take 3 mLs (175 mcg total) by nebulization daily.   sodium chloride  (OCEAN) 0.65 % SOLN nasal spray Place 1 spray into both nostrils as needed for congestion (nose bleeds).   Ubrogepant (UBRELVY) 100 MG TABS Take 25 mg by mouth daily as needed (headaches/migraines).   valACYclovir  (VALTREX ) 500 MG tablet Take 500 mg by mouth daily as needed (breakouts).    Physical Exam:    VS:  BP 120/70   Pulse (!) 55   Ht 5\' 6"  (1.676 m)   Wt 113 lb 6.4 oz (51.4 kg)   SpO2 95%   BMI 18.30 kg/m    Wt Readings from Last 3 Encounters:  11/10/23 113 lb 6.4 oz (51.4 kg)  11/02/23 107 lb 12.8 oz (48.9 kg)  10/27/23 106 lb (48.1 kg)    GEN: Frail, well developed in no acute distress NECK: No JVD; No carotid bruits CARDIAC: RRR, no murmurs, rubs, gallops RESPIRATORY:  Clear to auscultation without rales, wheezing or rhonchi  ABDOMEN: Soft, non-tender, non-distended EXTREMITIES:  +2 pedal edema; No acute deformity     Asessement and Plan:.    Persistent atrial fibrillation: Patient with history of PAF, recently with recurrent atrial fibrillation.  She was started on amiodarone  200 mg twice daily for 2 weeks then 200 mg daily thereafter with recommendation to proceed with cardioversion.  Patient underwent successful cardioversion on 10/27/2023.  EKG today indicates sinus bradycardia.  Patient notes that she is feeling significantly better since returning back to sinus rhythm.  She denies any palpitations, chest pain or increased shortness of breath.  She reports that she is tolerating amiodarone   and Eliquis  well, denies any significant bleeding.  Continue Eliquis  5 mg twice daily.  Continue amiodarone  200 mg daily.  Hypertension: Blood pressure today 120/70. Continue current antihypertensive regimen.  Chronic HFpEF/Chronic dyspnea: Echocardiogram from 01/2023 showed EF 55 to 60%, mild LVH, moderately reduced RV function with moderately elevated PA systolic pressure, moderate TR.  Patient also follows with pulmonology, has underlying bronchiectasis, pulmonology notes shortness of breath multifactorial from moderate restriction of PFTs, bronchiectasis and deconditioning.  Today patient notes increased lower extremity edema in the last week.  On exam she has +2 bilateral pitting  edema, she otherwise appears euvolemic on exam.  She notes that she has recently been regularly eating [place] food as well as eating microwavable meals.  She reports adherence with current regimen of Lasix .  Notes she has not been strictly following a fluid restriction.  Encourage patient to decrease sodium intake.  Will increase her Lasix  to 80 mg daily for the next 3 days, then resume her Lasix  as currently prescribed.  Check BMET next week.   Chest pain/Nonobstructive CAD: Patient has history of atypical chronic chest pain.  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 Stable with no anginal symptoms. No indication for ischemic evaluation.  Patient has declined statin therapy  OSA: Patient's previously noted to have nocturnal hypoxemia on overnight oxygen test in 09/2022.  Patient has declined CPAP therapy.  S/p mitral valve repair: Echo from 01/2023 showed trace mitral regurgitation and no significant mitral stenosis.  32 mm Sorin MMO 40 prosthetic anoplasty ring in the mitral position.  Continue SBE prophylaxis    Disposition: F/u with Dr. Audery Blazing in 12/2023 as planned.   Signed, Keante Urizar D Broderick Fonseca, NP

## 2023-11-09 ENCOUNTER — Encounter (HOSPITAL_BASED_OUTPATIENT_CLINIC_OR_DEPARTMENT_OTHER): Payer: Self-pay

## 2023-11-10 ENCOUNTER — Ambulatory Visit: Attending: Cardiology | Admitting: Cardiology

## 2023-11-10 ENCOUNTER — Encounter: Payer: Self-pay | Admitting: Cardiology

## 2023-11-10 VITALS — BP 120/70 | HR 55 | Ht 66.0 in | Wt 113.4 lb

## 2023-11-10 DIAGNOSIS — R0602 Shortness of breath: Secondary | ICD-10-CM | POA: Insufficient documentation

## 2023-11-10 DIAGNOSIS — Z9889 Other specified postprocedural states: Secondary | ICD-10-CM | POA: Insufficient documentation

## 2023-11-10 DIAGNOSIS — I10 Essential (primary) hypertension: Secondary | ICD-10-CM | POA: Insufficient documentation

## 2023-11-10 DIAGNOSIS — I4819 Other persistent atrial fibrillation: Secondary | ICD-10-CM | POA: Insufficient documentation

## 2023-11-10 DIAGNOSIS — F32A Depression, unspecified: Secondary | ICD-10-CM | POA: Insufficient documentation

## 2023-11-10 DIAGNOSIS — I5032 Chronic diastolic (congestive) heart failure: Secondary | ICD-10-CM | POA: Insufficient documentation

## 2023-11-10 DIAGNOSIS — F419 Anxiety disorder, unspecified: Secondary | ICD-10-CM | POA: Diagnosis not present

## 2023-11-10 DIAGNOSIS — I4892 Unspecified atrial flutter: Secondary | ICD-10-CM | POA: Insufficient documentation

## 2023-11-10 NOTE — Patient Instructions (Signed)
 Medication Instructions:  Increase lasix  to 80 mg once a day for the next 3 days then go back to original dosing *If you need a refill on your cardiac medications before your next appointment, please call your pharmacy*  Lab Work: Next week we are going to draw a Bmet If you have labs (blood work) drawn today and your tests are completely normal, you will receive your results only by: MyChart Message (if you have MyChart) OR A paper copy in the mail If you have any lab test that is abnormal or we need to change your treatment, we will call you to review the results.  Testing/Procedures: No testing  Follow-Up: At [facility], you and your health needs are our priority.  As part of our continuing mission to provide you with exceptional heart care, our providers are all part of one team.  This team includes your primary Cardiologist (physician) and Advanced Practice Providers or APPs (Physician Assistants and Nurse Practitioners) who all work together to provide you with the care you need, when you need it.  Your next appointment:   Keep follow up  We recommend signing up for the patient portal called "MyChart".  Sign up information is provided on this After Visit Summary.  MyChart is used to connect with patients for Virtual Visits (Telemedicine).  Patients are able to view lab/test results, encounter notes, upcoming appointments, etc.  Non-urgent messages can be sent to your provider as well.   To learn more about what you can do with MyChart, go to [email or web address].

## 2023-11-11 ENCOUNTER — Encounter: Payer: Self-pay | Admitting: Cardiology

## 2023-11-11 NOTE — Progress Notes (Signed)
 Order(s) created erroneously. Erroneous order ID: [id number]  Order moved by: CHART CORRECTION ANALYST NINE, IDENTITY  Order move date/time: 11/11/2023 8:50 AM  Source Patient: [id number]  Source Contact: 11/10/2023  Destination Patient: [id number]  Destination Contact: 12/17/2022  Erroneous order ID: [id number]  Order moved by: CHART CORRECTION ANALYST NINE, IDENTITY  Order move date/time: 11/11/2023 8:50 AM  Source Patient: [id number]  Source Contact: 11/10/2023  Destination Patient: [id number]  Destination Contact: 12/17/2022

## 2023-11-12 DIAGNOSIS — I10 Essential (primary) hypertension: Secondary | ICD-10-CM | POA: Diagnosis not present

## 2023-11-12 DIAGNOSIS — I4819 Other persistent atrial fibrillation: Secondary | ICD-10-CM | POA: Diagnosis not present

## 2023-11-12 DIAGNOSIS — F32A Depression, unspecified: Secondary | ICD-10-CM | POA: Diagnosis not present

## 2023-11-12 DIAGNOSIS — R0602 Shortness of breath: Secondary | ICD-10-CM | POA: Diagnosis not present

## 2023-11-12 DIAGNOSIS — F419 Anxiety disorder, unspecified: Secondary | ICD-10-CM | POA: Diagnosis not present

## 2023-11-12 DIAGNOSIS — Z9889 Other specified postprocedural states: Secondary | ICD-10-CM | POA: Diagnosis not present

## 2023-11-12 DIAGNOSIS — I4892 Unspecified atrial flutter: Secondary | ICD-10-CM | POA: Diagnosis not present

## 2023-11-12 DIAGNOSIS — I5032 Chronic diastolic (congestive) heart failure: Secondary | ICD-10-CM | POA: Diagnosis not present

## 2023-11-13 ENCOUNTER — Encounter: Payer: Self-pay | Admitting: Family Medicine

## 2023-11-13 ENCOUNTER — Ambulatory Visit (INDEPENDENT_AMBULATORY_CARE_PROVIDER_SITE_OTHER): Admitting: Family Medicine

## 2023-11-13 ENCOUNTER — Telehealth: Payer: Self-pay | Admitting: Cardiology

## 2023-11-13 VITALS — BP 122/62 | HR 72 | Temp 97.9°F | Ht 66.0 in | Wt 107.0 lb

## 2023-11-13 DIAGNOSIS — G8929 Other chronic pain: Secondary | ICD-10-CM

## 2023-11-13 DIAGNOSIS — R519 Headache, unspecified: Secondary | ICD-10-CM

## 2023-11-13 LAB — BASIC METABOLIC PANEL WITH GFR
BUN/Creatinine Ratio: 25 (ref 12–28)
BUN: 26 mg/dL (ref 8–27)
CO2: 30 mmol/L — ABNORMAL HIGH (ref 20–29)
Calcium: 9.2 mg/dL (ref 8.7–10.3)
Chloride: 97 mmol/L (ref 96–106)
Creatinine, Ser: 1.03 mg/dL — ABNORMAL HIGH (ref 0.57–1.00)
Glucose: 88 mg/dL (ref 70–99)
Potassium: 4.2 mmol/L (ref 3.5–5.2)
Sodium: 145 mmol/L — ABNORMAL HIGH (ref 134–144)
eGFR: 55 mL/min/{1.73_m2} — ABNORMAL LOW (ref >59)

## 2023-11-13 NOTE — Telephone Encounter (Signed)
  Pt c/o medication issue:  1. Name of Medication: kenalog  injection  2. How are you currently taking this medication (dosage and times per day)? Can be injected to her head, back and neck   3. Are you having a reaction (difficulty breathing--STAT)? No   4. What is your medication issue?  Pt would like to ask Katlyn if she is ok getting kenalog  injection for pain

## 2023-11-13 NOTE — Patient Instructions (Signed)
 Follow up as needed or as scheduled Call and ask for an appt w/ Dr Maxie Spaniel to discuss headaches and trigeminal neuralgia Try and limit your salt Elevate your legs when you are sitting Try and get telephone visits with your counselor so you can continue to work through these issues Call with any questions or concerns Hang in there! Happy Mother's Day!!

## 2023-11-13 NOTE — Progress Notes (Unsigned)
   Subjective:    Patient ID: [name], female    DOB: 12/17/1945, 78 y.o.   MRN: [id number]  HPI HA- pt reports HA has worsened.  'i've been to the ER 3 times for it'.  Had MRI on 3/5 that was WNL w/ exception of chronic small vessel disease.  Saw Neurology.  Was given Ubrelvy samples- 'they worked against me real bad'.  Apparently she tried to go back and discuss her headaches and possible medications and the PA 'ran the nurse out of the room and yelled at me that I should have never come back to the office'.  Pt reports ongoing R sided neck pain that radiates down into her shoulder blade.  ER had mentioned trigeminal neuralgia   Review of Systems For ROS see HPI     Objective:   Physical Exam Vitals reviewed.  Constitutional:      General: She is not in acute distress. HENT:     Head: Normocephalic and atraumatic.  Eyes:     Extraocular Movements: Extraocular movements intact.  Cardiovascular:     Rate and Rhythm: Normal rate and regular rhythm.  Pulmonary:     Effort: Pulmonary effort is normal. No respiratory distress.     Breath sounds: No wheezing or rhonchi.  Musculoskeletal:     Cervical back: Rigidity (due to presence of hardware) present.  Skin:    General: Skin is warm and dry.  Neurological:     Mental Status: She is alert.  Psychiatric:     Comments: Very anxious, tearful        Assessment & Plan:  Daily HA- deteriorated.  Pt reports she is having daily HA's that are nearly constant.  She has been to the ER 3x w/o relief or answers. MRI was unremarkable.  She reports the ER mentioned trigeminal neuralgia.  She saw Neurology but reports she was unable to take the Ubrelvy samples provided due to medication side effects- although not able to specify what that was.  States neuro PA yelled at her for not following directions.  This was very upsetting to her.  Encouraged her to communicate w/ MD and get an appt b/c if she does have trigeminal neuralgia, she will need  them to initiate tx.  Encouraged her to reach out to her former counselor and see if she can get virtual appts while trying to work through her multiple issues.  Total time spent w/ pt, >35 minutes and >50% spent counseling

## 2023-11-13 NOTE — Telephone Encounter (Signed)
 West, Katlyn D, NP  Antonetta Kitchen, RN Caller: Unspecified (Today,  3:05 PM) Please let Ms. Southward know that given recent cardioversion she cannot interrupt her Eliquis  therapy for four weeks post cardioversion. She should have the office performing the procedure send a preoperative exam request to the preop team.  Thank you! Katlyn West, NP         Left message for patient to call back.

## 2023-11-16 ENCOUNTER — Telehealth: Payer: Self-pay

## 2023-11-16 DIAGNOSIS — I480 Paroxysmal atrial fibrillation: Secondary | ICD-10-CM

## 2023-11-16 MED ORDER — APIXABAN 5 MG PO TABS: 5.0000 mg | ORAL_TABLET | Freq: Two times a day (BID) | ORAL | 3 refills | Status: DC

## 2023-11-16 NOTE — Telephone Encounter (Signed)
 Refilled Eliquis  for patient.

## 2023-11-16 NOTE — Telephone Encounter (Signed)
 Called patient advised of below they verbalized understanding.

## 2023-11-16 NOTE — Telephone Encounter (Signed)
-----   Message from Katlyn D West sent at 11/13/2023  8:05 AM EDT ----- Please let [name] know that her kidney function is stable and her electrolytes are overall normal. Her sodium level is mildly elevated, please encourage decreasing salt intake and staying hydrated with water. Continue current medications and follow up as planned.

## 2023-11-16 NOTE — Addendum Note (Signed)
 Addended by: Dominga Friedman on: 11/16/2023 02:11 PM   Modules accepted: Orders

## 2023-11-16 NOTE — Telephone Encounter (Signed)
 Left message to call back

## 2023-11-17 DIAGNOSIS — F411 Generalized anxiety disorder: Secondary | ICD-10-CM | POA: Diagnosis not present

## 2023-11-18 ENCOUNTER — Other Ambulatory Visit: Payer: Self-pay | Admitting: Pulmonary Disease

## 2023-11-18 DIAGNOSIS — J479 Bronchiectasis, uncomplicated: Secondary | ICD-10-CM

## 2023-11-20 DIAGNOSIS — I499 Cardiac arrhythmia, unspecified: Secondary | ICD-10-CM | POA: Diagnosis not present

## 2023-11-20 DIAGNOSIS — I10 Essential (primary) hypertension: Secondary | ICD-10-CM | POA: Diagnosis not present

## 2023-11-20 DIAGNOSIS — E43 Unspecified severe protein-calorie malnutrition: Secondary | ICD-10-CM | POA: Diagnosis not present

## 2023-11-20 DIAGNOSIS — N301 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: Secondary | ICD-10-CM | POA: Diagnosis not present

## 2023-11-20 DIAGNOSIS — R911 Solitary pulmonary nodule: Secondary | ICD-10-CM | POA: Diagnosis not present

## 2023-11-20 DIAGNOSIS — M810 Age-related osteoporosis without current pathological fracture: Secondary | ICD-10-CM | POA: Diagnosis not present

## 2023-11-20 DIAGNOSIS — G629 Polyneuropathy, unspecified: Secondary | ICD-10-CM | POA: Diagnosis not present

## 2023-11-20 DIAGNOSIS — E039 Hypothyroidism, unspecified: Secondary | ICD-10-CM | POA: Diagnosis not present

## 2023-11-21 ENCOUNTER — Telehealth: Payer: Self-pay | Admitting: Internal Medicine

## 2023-11-21 NOTE — Telephone Encounter (Signed)
 [name] contacted the cardiology after hour line to report that she took an extra amiodarone  200mg  tablet by mistake.  She was concerned and wanted to know what she needs to do given the extra dose.  I informed her that she should be fine and no need to come to the emergency department.  She expressed understanding and was appreciative of the call.

## 2023-11-23 NOTE — Telephone Encounter (Signed)
 Pt requesting a c/b from the nurse. Please advise

## 2023-11-23 NOTE — Telephone Encounter (Signed)
 Spoke with pt over the phone. She stated she was calling about being short of breath. She stated that after her DCCV (4/22), she was able to breath the best she had in a long time and now she is experiencing that shortness of breath again but denied any palpitations or other symptoms. Pt stated the SOB had restarted about 1.5-2 weeks ago. She saw her pulmonologist 5/9. Explained that Katlyn West, NP is out of the office today but that I would forward this to Dr. Audery Blazing and his nurse to review and see what his recommendations are. Pt verbalized understanding of plan.   BP and HR while on the phone:  155/100 - 91  152/99 - 92

## 2023-11-24 NOTE — Telephone Encounter (Signed)
 Call sent straight to triage. Patient complaining of having SOB and a pain in her chest since her cardioversion. There are no available spots for her with any APPs at this time. Will send message to [facility] to see if they can help patient and get an appointment to see them.

## 2023-11-24 NOTE — Telephone Encounter (Signed)
 Patient offered multiple appointments this week. Pt declined. Appt made for June 2nd per patient request.

## 2023-11-25 ENCOUNTER — Ambulatory Visit (INDEPENDENT_AMBULATORY_CARE_PROVIDER_SITE_OTHER)

## 2023-11-25 ENCOUNTER — Ambulatory Visit: Admitting: Nurse Practitioner

## 2023-11-25 ENCOUNTER — Encounter: Payer: Self-pay | Admitting: Nurse Practitioner

## 2023-11-25 ENCOUNTER — Other Ambulatory Visit: Payer: Self-pay

## 2023-11-25 VITALS — BP 130/84 | HR 78 | Ht 66.0 in | Wt 103.4 lb

## 2023-11-25 DIAGNOSIS — E43 Unspecified severe protein-calorie malnutrition: Secondary | ICD-10-CM

## 2023-11-25 DIAGNOSIS — R0602 Shortness of breath: Secondary | ICD-10-CM

## 2023-11-25 DIAGNOSIS — R0600 Dyspnea, unspecified: Secondary | ICD-10-CM | POA: Diagnosis not present

## 2023-11-25 DIAGNOSIS — I340 Nonrheumatic mitral (valve) insufficiency: Secondary | ICD-10-CM | POA: Diagnosis not present

## 2023-11-25 DIAGNOSIS — J479 Bronchiectasis, uncomplicated: Secondary | ICD-10-CM | POA: Diagnosis not present

## 2023-11-25 DIAGNOSIS — I480 Paroxysmal atrial fibrillation: Secondary | ICD-10-CM

## 2023-11-25 DIAGNOSIS — M4120 Other idiopathic scoliosis, site unspecified: Secondary | ICD-10-CM | POA: Diagnosis not present

## 2023-11-25 DIAGNOSIS — Z79899 Other long term (current) drug therapy: Secondary | ICD-10-CM

## 2023-11-25 DIAGNOSIS — R0609 Other forms of dyspnea: Secondary | ICD-10-CM

## 2023-11-25 DIAGNOSIS — I5032 Chronic diastolic (congestive) heart failure: Secondary | ICD-10-CM

## 2023-11-25 DIAGNOSIS — F32A Depression, unspecified: Secondary | ICD-10-CM

## 2023-11-25 DIAGNOSIS — Z8679 Personal history of other diseases of the circulatory system: Secondary | ICD-10-CM

## 2023-11-25 DIAGNOSIS — R7989 Other specified abnormal findings of blood chemistry: Secondary | ICD-10-CM

## 2023-11-25 DIAGNOSIS — Z9889 Other specified postprocedural states: Secondary | ICD-10-CM

## 2023-11-25 DIAGNOSIS — E039 Hypothyroidism, unspecified: Secondary | ICD-10-CM

## 2023-11-25 MED ORDER — POTASSIUM CHLORIDE CRYS ER 20 MEQ PO TBCR: 20.0000 meq | EXTENDED_RELEASE_TABLET | Freq: Every day | ORAL | 3 refills | Status: DC

## 2023-11-25 NOTE — Assessment & Plan Note (Signed)
 Worsening DOE over the last 2 weeks. Unclear etiology. She is in regular rhythm today. Lung exam clear. CMA had walked her and reported desaturations; however, when I rewalked her on room air, she did not have any desaturations and maintained average of 97% with SpO2 low 94%. She briefly desaturated to 89% upon arrival to exam room but she was breath holding and quickly corrected to 94% without supplemental oxygen. Reminded on breathing techniques. CXR ordered for further evaluation. Labs with BNP/BMET, CBC, and TSH to evaluate for cardiac etiology, anemia, thyroid  disease.   Patient Instructions  -Continue Albuterol  inhaler 2 puffs every 6 hours as needed for shortness of breath or wheezing. Notify if symptoms persist despite rescue inhaler/neb use.  -Restart Yupelri  3 mL neb daily. Make sure you are using this daily  Just remember to take deep breaths and not hold your breath if you're nervous/anxious. Otherwise, your oxygen levels looked good when I walked you!  Chest x ray and labs today   Keep appointment with your heart team next week  Follow up in 6 weeks with Dr. Diania Fortes or Gina Lagos. If symptoms do not improve or worsen, please contact office for sooner follow up or seek emergency care.

## 2023-11-25 NOTE — Assessment & Plan Note (Signed)
 She appears to be in regular sinus rhythm today. Post DCCV. Follow up with a fib clinic as scheduled

## 2023-11-25 NOTE — Assessment & Plan Note (Signed)
 Encouraged to focus on small, frequent, high protein meals. Monitor weight. Manage stress/anxiety.

## 2023-11-25 NOTE — Assessment & Plan Note (Signed)
 S/p multiple spinal surgeries. Unclear if this is driving her DOE. Very low probability of PE associated to pain and DOE given chronic AC use. Advised that if workup is unremarkable, recommend she follow up with her orthopedist/spine doctor for further evaluation.

## 2023-11-25 NOTE — Assessment & Plan Note (Signed)
 Hx of severe MR s/p MV repair. Assess BNP today. If DOE fails to improve and no underlying etiology identified, will need to repeat echocardiogram. Follow up with cardiology as scheduled.

## 2023-11-25 NOTE — Assessment & Plan Note (Signed)
 Suspect this is a large contributor to her symptoms. Encouraged her to continue working with a counselor and seek care from alternative therapist, if she feels she is not being properly treated at this time. No SI/HI.

## 2023-11-25 NOTE — Patient Instructions (Addendum)
-  Continue Albuterol  inhaler 2 puffs every 6 hours as needed for shortness of breath or wheezing. Notify if symptoms persist despite rescue inhaler/neb use.  -Restart Yupelri  3 mL neb daily. Make sure you are using this daily  Just remember to take deep breaths and not hold your breath if you're nervous/anxious. Otherwise, your oxygen levels looked good when I walked you!  Chest x ray and labs today   Keep appointment with your heart team next week  Follow up in 6 weeks with Dr. Diania Fortes or Gina Lagos. If symptoms do not improve or worsen, please contact office for sooner follow up or seek emergency care.

## 2023-11-25 NOTE — Progress Notes (Signed)
 @Patient  ID: [name], female    DOB: Dec 04, 1945, 78 y.o.   MRN: [id number]  Chief Complaint  Patient presents with   Follow-up    Patient states her breathing has gotten worse.    Referring provider: Jess Morita, MD  HPI: 78 year old female, former smoker followed for bronchiectasis. She is a patient of Dr. Reine Caraway and last seen in office 11/02/2023. Past medical history significant for MR s/p minimally invasive MV repair and MAZE procedure complicated by LV free wall rupture 10/2019, PAF on Eliquis , HTN, GERD, hypothyroid, chronic b/l eustachian tube dysfunction, hx of trigeminal neuralgia, scoliosis and kyphoscoliosis with hx of 7 spinal surgeries, chronic interstitial cystitis, anxiety and depression, HLD, Raynaud's, protein-calorie malnutrition, OSA.   TEST/EVENTS:  01/01/2021 PFT: FVC 51, FEV1 50, ratio 81, TLC 84, DLCO 108. Reduced lung function without formal obstruction or restriction. Normal diffusion capacity 02/13/2022 CT chest: cardiomegaly. Atherosclerosis. Prior mitral valve replacement. Biapical pleural/pulm scarring. Interval worsening of tree in bud nodularity within the RML. Left upper lobe tree in bud nodularity. Similar appearing chronic LUL btx with mucous plugging. Stable LUL subpleural micronodules. Interval development of a 6x4 mm nodule. No f/u necessary if pt low risk.  11/02/2023: OV with Dr. Diania Fortes. Recently had cardioversion for a fib. Has not been using Yupelri . Dealing with a persistent headache on the right posterior side of her head. Following with neurology for this. Recently picked up xopenex . No cough or wheezing. Continues to have exertional dyspnea. Advised to resume Yupeleri.   11/25/2023: Today - acute Discussed the use of AI scribe software for clinical note transcription with the patient, who gave verbal consent to proceed.  History of Present Illness   [name] is a 78 year old female presents with worsening shortness of breath and back  pain.  She has been experiencing worsening shortness of breath and back pain between her shoulder blades for the past 2 weeks. The back pain was so severe that she was unable to get out of bed last weekend but has since improved. She does have a significant history of spine disease resulting in multiple surgeries. No cough, fevers, chills, hemoptysis, night sweats. No known injury/trauma. She has not missed any doses of her Eliquis . No bleeding or excessive bruising.   Her breathing difficulties are exacerbated by physical activity, such as walking from her car to a store, and she often needs to sit down to catch her breath. At home, she can perform light activities like washing dishes but struggles with more strenuous tasks like vacuuming.  She has a history of atrial fibrillation and underwent cardioversion in April, after which she initially felt better. No palpitations, CP, PND, orthopnea. She also has a history of hypothyroidism and takes her thyroid  medication daily. She has lost three pounds recently, bringing her weight down to 103 pounds. She attributes her weight loss to stress and lack of appetite.  She has been using Yupelri  nebulizer intermittently but is unsure about the cleaning process, which has led to her discontinuing its use. She has not been using her albuterol . She does not monitor her oxygen levels at home.  She has a lot of stress and anxiety. She worries about her health a lot as well. She lives alone. Doesn't get out of the house much. She has been trying to speak with a counselor but is dissatisfied with the current level of support. She is in the process of finding another one.  Allergies  Allergen Reactions   Buprenorphine Nausea Only and Other (See Comments)    sedation and adhesive reaction--patches    Erythromycin Nausea And Vomiting and Other (See Comments)    Dizziness     Iodinated Contrast Media Hives, Nausea Only and Rash   Latex Palpitations, Rash and  Other (See Comments)    Hives/sores   Other Other (See Comments)    CAN NOT TAKE , aLPRAZOLAM, OR ELAVIL DUE TO AFIB FOR ANXIETY   Steroids-"jittery/hyper" "wants to crawl out of skin"   Tape Hives, Itching, Rash and Other (See Comments)    Heart monitor stickers must be rotated in order to prevent rash Adhesive on EKG tabs=BURNS SKIN**PAPER TAPE OK** sores     Ciprofloxacin  Other (See Comments)    Burning all over   Duloxetine  Hcl Other (See Comments)    Severe diarrhea and upset stomach   Gabapentin  Other (See Comments)    Unknown--unsteady gait/felt intoxicated   Hydrocodone Other (See Comments)    Sedation,dizziness, and nausea   Hydromorphone  Nausea Only and Other (See Comments)    Sedation,dizziness, and nausea   Iodine Hives   Levofloxacin Nausea And Vomiting and Nausea Only   Metrizamide Hives   Nsaids     CANNOT TAKE PER CARDIOLOGIST DUE TO AFIB    Nucynta [Tapentadol Hcl] Nausea And Vomiting and Other (See Comments)    nausea and sedation   Oxycodone  Itching, Nausea Only and Other (See Comments)    Delusions (intolerance)  PILLS ONLY sedation, dizziness, nausea,  and itching   Pentazocine Other (See Comments)    Unknown    Pregabalin Nausea Only    Unknown    Sulfasalazine Hives   Morphine Nausea Only, Anxiety and Other (See Comments)    sedation and nausea delusions (intolerance) PT CANNOT WAKE UP AFTER TAKING MEDICATION AND HAS NIGHTMARES     Sulfa Antibiotics Hives, Nausea Only and Rash   Sulfamethoxazole-Trimethoprim Hives and Rash    Immunization History  Administered Date(s) Administered   DTaP 07/07/2009   Influenza Inj Mdck Quad Pf 03/25/2019, 04/19/2021, 04/25/2022   Influenza Split 03/07/2012, 05/08/2018   Influenza, High Dose Seasonal PF 05/02/2016   Influenza, Seasonal, Injecte, Preservative Fre 05/08/2018, 03/25/2019, 04/18/2023   Influenza,inj,Quad PF,6+ Mos 06/08/2013, 04/06/2015, 05/02/2017, 05/08/2018   Influenza-Unspecified 03/07/2012,  06/08/2013, 04/06/2015, 05/02/2016, 05/02/2017, 05/08/2018, 04/26/2021   PFIZER(Purple Top)SARS-COV-2 Vaccination 08/11/2019, 09/01/2019   Pneumococcal Conjugate-13 04/19/2015   Pneumococcal Polysaccharide-23 08/07/2009, 12/27/2013   Pneumococcal-Unspecified 04/19/2015   Tdap 08/02/2020   Zoster Recombinant(Shingrix) 07/08/2010   Zoster, Live 07/07/2010    Past Medical History:  Diagnosis Date   Allergies    Anxiety    Arthritis    "maybe in my back" (03/31/2018)   Benign paroxysmal positional vertigo 06/08/2013   Complication of anesthesia    Fracture of multiple ribs 2015   "don't know from what; dx'd when I in hospital for 1st back OR" (03/31/2018)   GERD (gastroesophageal reflux disease)    Hair loss 04/12/2012   Herpes    History of blood transfusion    "twice; related to back OR" (03/31/2018)   History of kidney stones    Interstitial cystitis 11/06/2011   Melanoma of ankle (HCC) ~ 2003   "right"   Mitral regurgitation    Osteopenia 02/18/2012   Osteoporosis    PAF (paroxysmal atrial fibrillation) (HCC) 2012   Peripheral neuropathy 11/06/2011   PMDD (premenstrual dysphoric disorder)    PONV (postoperative nausea and vomiting)    nausea,  vomiting, hives and dizziness    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/2020   Complete bilateral atrial lesion set using cryothermy and bipolar radiofrequency ablation with clipping of LA appendage via right mini-thoracotomy approach   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   Complex valvuloplasty including artificial Gore-tex neochord placement x12 with 32mm Sorin Memo 4D ring annuloplasty   Seasonal allergies    Vaginal delivery    ONE NSVD   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    Tobacco History: Social History   Tobacco Use  Smoking Status Former   Current packs/day: 0.00   Average packs/day: 0.1 packs/day for 14.0 years (1.4 ttl pk-yrs)   Types: Cigarettes   Start date: 1966   Quit date: 71   Years since quitting: 45.4    Passive exposure: Past  Smokeless Tobacco Never  Tobacco Comments   03/31/2018 "quit ~ 1980; someday smoker when I did smoke; never addicted"   Counseling given: Not Answered Tobacco comments: 03/31/2018 "quit ~ 1980; someday smoker when I did smoke; never addicted"   Outpatient Medications Prior to Visit  Medication Sig Dispense Refill   acetaminophen  (TYLENOL ) 500 MG tablet Take 500 mg by mouth every 6 (six) hours as needed (pain.).     amiodarone  (PACERONE ) 200 MG tablet Take 200 mg by mouth daily.     apixaban  (ELIQUIS ) 5 MG TABS tablet Take 1 tablet (5 mg total) by mouth 2 (two) times daily. 180 tablet 3   calcium  carbonate (OS-CAL) 1250 (500 Ca) MG chewable tablet Chew 1 tablet by mouth every evening.     Calcium  Carbonate Antacid (TUMS CHEWY BITES PO) Take 1 tablet by mouth daily as needed (reflux).      Carboxymethylcellul-Glycerin  (LUBRICATING EYE DROPS OP) Place 1 drop into both eyes 3 (three) times daily as needed (dry/irritated eys.).     cephALEXin  (KEFLEX ) 500 MG capsule Take 2 grams (4 tablets) by mouth 1 hour prior to dental cleaning/procedure. 4 capsule 2   Cholecalciferol (VITAMIN D -3 PO) Take 1 tablet by mouth every evening.     clonazePAM  (KLONOPIN ) 0.5 MG tablet Take 1 tablet by mouth twice daily as needed for anxiety (Patient taking differently: Take 0.125-0.25 mg by mouth 2 (two) times daily as needed for anxiety.) 30 tablet 3   cyanocobalamin  (VITAMIN B12) 1000 MCG/ML injection Inject 1,000 mcg into the muscle every 30 (thirty) days.     denosumab  (PROLIA ) 60 MG/ML SOSY injection Inject 60 mg into the skin every 6 (six) months.     furosemide  (LASIX ) 20 MG tablet Take 20 mg by mouth every Tuesday, Thursday, and Saturday at 6 PM. Per patient taking 60 mg on these days     furosemide  (LASIX ) 40 MG tablet Take 1 tablet (40 mg total) by mouth daily.     levalbuterol  (XOPENEX  HFA) 45 MCG/ACT inhaler Inhale 2 puffs into the lungs every 4 (four) hours as needed for wheezing. 1  each 6   levothyroxine  (SYNTHROID ) 75 MCG tablet Take 1 tablet by mouth once daily 90 tablet 0   loratadine (CLARITIN) 10 MG tablet Take 10 mg by mouth in the morning.     meclizine  (ANTIVERT ) 25 MG tablet TAKE 1 TABLET BY MOUTH THREE TIMES DAILY AS NEEDED FOR DIZZINESS 45 tablet 0   metoprolol  tartrate (LOPRESSOR ) 25 MG tablet Take 1 tablet by mouth twice daily 180 tablet 3   mupirocin ointment (BACTROBAN) 2 % Apply 1 Application topically daily.     nystatin  (MYCOSTATIN /NYSTOP ) powder Apply topically 2 (two) times daily  as needed (skin irritation (under breasts)). 15 g 0   ondansetron  (ZOFRAN ) 8 MG tablet Take 1 tablet (8 mg total) by mouth every 8 (eight) hours as needed for nausea or vomiting. 45 tablet 1   oxyCODONE  (ROXICODONE ) 5 MG/5ML solution Take 4 mLs (4 mg total) by mouth every 4 (four) hours as needed for moderate pain. (Patient taking differently: Take 5 mg by mouth every 4 (four) hours as needed (pain.).)  0   pantoprazole  (PROTONIX ) 40 MG tablet Take 1 tablet by mouth once daily 30 tablet 3   polyethylene glycol (MIRALAX  / GLYCOLAX ) packet Take 17 g by mouth daily as needed for mild constipation.     potassium chloride  SA (KLOR-CON  M) 20 MEQ tablet Take 1 tablet (20 mEq total) by mouth daily. 90 tablet 3   revefenacin  (YUPELRI ) 175 MCG/3ML nebulizer solution Inhale one vial in nebulizer once daily. Do not mix with other nebulized medications. 3 mL 11   sodium chloride  (OCEAN) 0.65 % SOLN nasal spray Place 1 spray into both nostrils as needed for congestion (nose bleeds).     valACYclovir  (VALTREX ) 500 MG tablet Take 500 mg by mouth daily as needed (breakouts).     Ubrogepant (UBRELVY) 100 MG TABS Take 25 mg by mouth daily as needed (headaches/migraines). (Patient not taking: Reported on 11/25/2023)     No facility-administered medications prior to visit.     Review of Systems:   Constitutional: No night sweats, fevers, chills + fatigue, lassitude, weight loss  HEENT: No  headaches, difficulty swallowing, tooth/dental problems, or sore throat. No sneezing, itching, ear ache, nasal congestion, or post nasal drip CV:  No chest pain, orthopnea, PND, swelling in lower extremities, anasarca, dizziness, palpitations, syncope Resp: +shortness of breath with exertion. No excess mucus or change in color of mucus. No productive or non-productive. No hemoptysis. No wheezing.  No chest wall deformity GI:  No heartburn, indigestion, abdominal pain, nausea, vomiting, diarrhea, change in bowel habits, loss of appetite, bloody stools.  GU: No dysuria, change in color of urine, urgency or frequency.  Skin: No rash, lesions, ulcerations MSK:  +chronic joint pains; chronic, increased back pain  Neuro: No dizziness or lightheadedness.  Psych: No depression or anxiety. Mood stable.     Physical Exam:  BP 130/84 (BP Location: Left Arm, Patient Position: Sitting)   Pulse 78   Ht 5\' 6"  (1.676 m)   Wt 103 lb 6.4 oz (46.9 kg)   SpO2 94% Comment: room air  BMI 16.69 kg/m   GEN: Pleasant, interactive, well-kempt; in no acute distress HEENT:  Normocephalic and atraumatic. PERRLA. Sclera white. Nasal turbinates pink, moist and patent bilaterally. No rhinorrhea present. Oropharynx pink and moist, without exudate or edema. No lesions, ulcerations, or postnasal drip.  NECK:  Supple w/ fair ROM. No JVD present. Normal carotid impulses w/o bruits. Thyroid  symmetrical with no goiter or nodules palpated. No lymphadenopathy.   CV: RRR, no m/r/g, no peripheral edema. Pulses intact, +2 bilaterally. No cyanosis, pallor or clubbing. PULMONARY:  Unlabored, regular breathing. Clear bilaterally A&P w/o wheezes/rales/rhonchi. No accessory muscle use.  GI: BS present and normoactive. Soft, non-tender to palpation. MSK: No erythema, warmth or tenderness upon palpation. Cap refil <2 sec all extrem. Muscle wasting Neuro: A/Ox3. No focal deficits noted.   Skin: Warm, no lesions or rashe Psych: Normal  affect and behavior. Judgement and thought content appropriate.     Lab Results:  CBC    Component Value Date/Time   WBC 11.1 (H) 10/21/2023  1617   WBC 5.6 09/07/2023 1253   RBC 4.38 10/21/2023 1617   RBC 4.15 09/07/2023 1253   HGB 13.1 10/21/2023 1617   HCT 39.7 10/21/2023 1617   PLT 197 10/21/2023 1617   MCV 91 10/21/2023 1617   MCH 29.9 10/21/2023 1617   MCH 29.6 09/07/2023 1253   MCHC 33.0 10/21/2023 1617   MCHC 31.8 09/07/2023 1253   RDW 13.1 10/21/2023 1617   LYMPHSABS 0.8 09/07/2023 1253   MONOABS 0.4 09/07/2023 1253   EOSABS 0.1 09/07/2023 1253   BASOSABS 0.0 09/07/2023 1253    BMET    Component Value Date/Time   NA 145 (H) 11/12/2023 1542   K 4.2 11/12/2023 1542   CL 97 11/12/2023 1542   CO2 30 (H) 11/12/2023 1542   GLUCOSE 88 11/12/2023 1542   GLUCOSE 94 09/07/2023 1253   BUN 26 11/12/2023 1542   CREATININE 1.03 (H) 11/12/2023 1542   CREATININE 0.79 07/24/2023 1515   CALCIUM  9.2 11/12/2023 1542   GFRNONAA >60 09/07/2023 1253   GFRNONAA 89 09/08/2019 1550   GFRAA >60 02/06/2020 0554   GFRAA 103 09/08/2019 1550    BNP    Component Value Date/Time   BNP 253.3 (H) 01/22/2023 0000   BNP 72.6 08/07/2014 0830     Imaging:  DG Chest 2 View Result Date: 11/25/2023 CLINICAL DATA:  Dyspnea EXAM: CHEST - 2 VIEW COMPARISON:  Chest x-ray 12/23/2022 FINDINGS: Some replacement and atrial appendage clipping again noted. The cardiomediastinal silhouette is within normal limits. The lungs are hyperinflated. There is no focal lung infiltrate, pleural effusion or pneumothorax. Extensive thoracolumbar fusion hardware again noted. IMPRESSION: No active cardiopulmonary disease. Hyperinflated lungs. Electronically Signed   By: Tyron Gallon M.D.   On: 11/25/2023 17:42   EP STUDY Result Date: 10/27/2023 See surgical note for result.   Administration History     None          Latest Ref Rng & Units 01/01/2021   11:49 AM 12/01/2019    9:39 AM  PFT Results   FVC-Pre L 1.55  2.13   FVC-Predicted Pre % 51  69   FVC-Post L 1.67    FVC-Predicted Post % 55    Pre FEV1/FVC % % 74  74   Post FEV1/FCV % % 81    FEV1-Pre L 1.15  1.57   FEV1-Predicted Pre % 50  68   FEV1-Post L 1.36    DLCO uncorrected ml/min/mmHg 21.48  14.09   DLCO UNC% % 105  68   DLCO corrected ml/min/mmHg 22.12  14.56   DLCO COR %Predicted % 108  71   DLVA Predicted % 110  105   TLC L 4.50  5.36   TLC % Predicted % 84  100   RV % Predicted % 131  140     No results found for: "NITRICOXIDE"      Assessment & Plan:   SOB (shortness of breath) Worsening DOE over the last 2 weeks. Unclear etiology. She is in regular rhythm today. Lung exam clear. CMA had walked her and reported desaturations; however, when I rewalked her on room air, she did not have any desaturations and maintained average of 97% with SpO2 low 94%. She briefly desaturated to 89% upon arrival to exam room but she was breath holding and quickly corrected to 94% without supplemental oxygen. Reminded on breathing techniques. CXR ordered for further evaluation. Labs with BNP/BMET, CBC, and TSH to evaluate for cardiac etiology, anemia, thyroid  disease.  Patient Instructions  -Continue Albuterol  inhaler 2 puffs every 6 hours as needed for shortness of breath or wheezing. Notify if symptoms persist despite rescue inhaler/neb use.  -Restart Yupelri  3 mL neb daily. Make sure you are using this daily  Just remember to take deep breaths and not hold your breath if you're nervous/anxious. Otherwise, your oxygen levels looked good when I walked you!  Chest x ray and labs today   Keep appointment with your heart team next week  Follow up in 6 weeks with Dr. Diania Fortes or Gina Lagos. If symptoms do not improve or worsen, please contact office for sooner follow up or seek emergency care.    Bronchiectasis (HCC) Reduced FEV1 on prior testing without formal obstruction. No infectious symptoms or cough. Advised to  resume Yupelri  and reassess response. Action plan in place. See above plan.   Atrial fibrillation (HCC) She appears to be in regular sinus rhythm today. Post DCCV. Follow up with a fib clinic as scheduled  Severe mitral regurgitation Hx of severe MR s/p MV repair. Assess BNP today. If DOE fails to improve and no underlying etiology identified, will need to repeat echocardiogram. Follow up with cardiology as scheduled.   Anxiety and depression Suspect this is a large contributor to her symptoms. Encouraged her to continue working with a counselor and seek care from alternative therapist, if she feels she is not being properly treated at this time. No SI/HI.   Idiopathic scoliosis and kyphoscoliosis S/p multiple spinal surgeries. Unclear if this is driving her DOE. Very low probability of PE associated to pain and DOE given chronic AC use. Advised that if workup is unremarkable, recommend she follow up with her orthopedist/spine doctor for further evaluation.   Severe protein-calorie malnutrition (HCC) Encouraged to focus on small, frequent, high protein meals. Monitor weight. Manage stress/anxiety.    Advised if symptoms do not improve or worsen, to please contact office for sooner follow up or seek emergency care.   I spent 45 minutes of dedicated to the care of this patient on the date of this encounter to include pre-visit review of records, face-to-face time with the patient discussing conditions above, post visit ordering of testing, clinical documentation with the electronic health record, making appropriate referrals as documented, and communicating necessary findings to members of the patients care team.  Roetta Clarke, NP 11/25/2023  Pt aware and understands NP's role.

## 2023-11-25 NOTE — Assessment & Plan Note (Signed)
 Reduced FEV1 on prior testing without formal obstruction. No infectious symptoms or cough. Advised to resume Yupelri  and reassess response. Action plan in place. See above plan.

## 2023-11-26 ENCOUNTER — Ambulatory Visit: Payer: Self-pay | Admitting: Nurse Practitioner

## 2023-11-26 DIAGNOSIS — R0609 Other forms of dyspnea: Secondary | ICD-10-CM

## 2023-11-26 LAB — BRAIN NATRIURETIC PEPTIDE: Pro B Natriuretic peptide (BNP): 296 pg/mL — ABNORMAL HIGH (ref 0.0–100.0)

## 2023-11-26 LAB — CBC WITH DIFFERENTIAL/PLATELET
Basophils Absolute: 0.1 10*3/uL (ref 0.0–0.1)
Basophils Relative: 0.7 % (ref 0.0–3.0)
Eosinophils Absolute: 0.2 10*3/uL (ref 0.0–0.7)
Eosinophils Relative: 2.5 % (ref 0.0–5.0)
HCT: 41.7 % (ref 36.0–46.0)
Hemoglobin: 13.5 g/dL (ref 12.0–15.0)
Lymphocytes Relative: 20.7 % (ref 12.0–46.0)
Lymphs Abs: 1.4 10*3/uL (ref 0.7–4.0)
MCHC: 32.3 g/dL (ref 30.0–36.0)
MCV: 92.2 fl (ref 78.0–100.0)
Monocytes Absolute: 0.6 10*3/uL (ref 0.1–1.0)
Monocytes Relative: 8.6 % (ref 3.0–12.0)
Neutro Abs: 4.5 10*3/uL (ref 1.4–7.7)
Neutrophils Relative %: 67.5 % (ref 43.0–77.0)
Platelets: 198 10*3/uL (ref 150.0–400.0)
RBC: 4.52 Mil/uL (ref 3.87–5.11)
RDW: 14.7 % (ref 11.5–15.5)
WBC: 6.7 10*3/uL (ref 4.0–10.5)

## 2023-11-26 LAB — BASIC METABOLIC PANEL WITH GFR
BUN: 32 mg/dL — ABNORMAL HIGH (ref 6–23)
CO2: 34 meq/L — ABNORMAL HIGH (ref 19–32)
Calcium: 9.6 mg/dL (ref 8.4–10.5)
Chloride: 99 meq/L (ref 96–112)
Creatinine, Ser: 1.23 mg/dL — ABNORMAL HIGH (ref 0.40–1.20)
GFR: 41.85 mL/min — ABNORMAL LOW (ref >60.00)
Glucose, Bld: 101 mg/dL — ABNORMAL HIGH (ref 70–99)
Potassium: 5.3 meq/L — ABNORMAL HIGH (ref 3.5–5.1)
Sodium: 142 meq/L (ref 135–145)

## 2023-11-26 LAB — TSH: TSH: 1.38 u[IU]/mL (ref 0.35–5.50)

## 2023-11-26 NOTE — Progress Notes (Signed)
 BNP elevated with decline in kidney function. Please instruct patient to contact her heart doctor to see if they can get her in for sooner appointment given her worsening dyspnea and labs indicating strain on the heart.  Her potassium level was also slightly elevated. Hold potassium for next 3 days then recheck BMET Monday - does not need appt for this.  CBC was normal. No identifiable acute pulmonary cause of her symptoms.

## 2023-11-26 NOTE — Progress Notes (Signed)
 Lungs are a little larger appearing on her x ray, which is normal for her lung disease. There is no evidence of pneumonia or fluid overload.

## 2023-11-26 NOTE — Telephone Encounter (Signed)
 Called patient regarding C-xray results. Her mailbox was full, couldn't leave a message.

## 2023-11-26 NOTE — Telephone Encounter (Signed)
-----   Message from Roetta Clarke sent at 11/26/2023 11:39 AM EDT ----- Lungs are a little larger appearing on her x ray, which is normal for her lung disease. There is no evidence of pneumonia or fluid overload.

## 2023-11-27 ENCOUNTER — Encounter: Payer: Self-pay | Admitting: Family Medicine

## 2023-11-27 ENCOUNTER — Telehealth: Payer: Self-pay | Admitting: Physician Assistant

## 2023-11-27 NOTE — Telephone Encounter (Signed)
 Patient paged after-hours answering service due to concern regarding the recent blood work from 11/25/2023 done by her pulmonologist.  I reviewed her blood work, creatinine has been slowly trending up over the past month.  proBNP was elevated at 290, however looking back, her proBNP was over 1200 back in 2023.  Based on the current blood work, I am unable to tell if she is volume overloaded versus dehydrated, I informed the patient that we need to do physical exam of her in order to figure out if she is dry or wet.  She has already been informed by her pulmonologist based on the blood work to hold potassium supplement for 3 days because of potassium of 5.3.  She has a follow-up with Katlyn West on Tuesday at which time she can be examined in the office.

## 2023-11-27 NOTE — Telephone Encounter (Signed)
 Copied from CRM [id number]. Topic: Clinical - Lab/Test Results >> Nov 27, 2023  9:56 AM Hilton Lucky wrote: Reason for CRM: Patient is calling back for test results. Patient declined to have agent read results verbatim. Patient states she would like a nurse to provide her a call back today, as she does not want to wait all weekend.  Patient states she will be calling back throughout the day until she gets a nurse to call her.  Spoke with Korene Pert. Advised her of lab & x-ray results. Pt verbalized understanding & had no concerns after I explained. BMET lab order has been placed and pt is coming into office Tuesday to have drawn.  Nothing further needed.

## 2023-11-30 NOTE — Progress Notes (Deleted)
 Cardiology Office Note    Date:  11/30/2023  ID:  [name], [name] 05-15-1946, MRN [id number] PCP:  Jess Morita, MD  Cardiologist:  Alexandria Angel, MD  Electrophysiologist:  Lei Pump, MD   Chief Complaint: ***  History of Present Illness: .    [name] is a 78 y.o. female with visit-pertinent history of chronic dyspnea, PAF, previous MV repair, coronary artery disease.  Patient had an echocardiogram in 07/2019 that showed EF 60 to 65%, no RWMA, normal RV systolic function, moderate mitral valve regurgitation with by leaflet prolapse.  She underwent TEE on 11/21/2019 that showed severe mitral valve regurgitation with a flail segment of the anterior leaflet.  Is recommended that she undergo mitral valve repair.  Prior to procedure patient had a right/left heart catheterization on 12/02/2019 that showed mild, nonobstructive CAD with 20% stenosis in the proximal RCA and distal LAD.  She was seen by Dr. Lander Pines and underwent minimally invasive mitral valve repair, complete maze procedure and sternotomy with repair of left ventricular free wall laceration on 01/17/2020.  Postoperatively she had acute blood loss anemia requiring blood transfusions and A-fib with RVR for which she was placed on amiodarone  and Lopressor  and Tikosyn  was discontinued.  Later amiodarone  was discontinued and her A-fib has been managed with Eliquis  and metoprolol .  Patient has noted history of chronic dyspnea, echocardiogram from 02/2022 showed EF 50 to 55%, moderately reduced RV systolic function.  There was interventricular septal flattening in systole consistent with RV pressure overload.  Findings were consistent with normal structure and function of mitral valve prosthesis.  Follow-up chest CT in 02/2022 revealed right middle lobe and left upper lobe nodularity suggestive of atypical infection, nodule in the left major fissure.  She is followed by Dr. Diania Fortes with pulmonology.  When seen by cardiology in  10/2022 patient reported frequent nosebleeds while on Eliquis .  She also noted pain in her calves and ankles, underwent ABIs in 11/2022 that were normal.  Echocardiogram on 01/27/2023 indicated LVEF of 55 to 60%, LV with normal function, mild LVH, into her ventricular septum is flattened in systole, consistent with right ventricular pressure overload.  RV systolic function moderately reduced, RV size moderately enlarged, moderately elevated pulmonary artery systolic pressure.  Mitral valve repair/replaced, trivial mitral valve regurgitation, mean gradient 2.0 mmHg with an average heart rate of 60 bpm.  Patient was seen in clinic on 07/07/2023.  At that time patient reported feelings of palpitations and elevated heart rate.  Restarting amiodarone  was discussed with patient preferred to continue current treatment and monitor symptoms.  She remained on the Toprol  tartrate 25 mg twice daily and Eliquis  5 mg twice daily.  Patient was last seen in clinic on 09/23/2023, she had [facility] by report was in recurrent atrial fibrillation.  Patient was seen by Dr. Audery Blazing, noted to continue having dyspnea on exertion which was unchanged.  EKG indicated that she was back in atrial fibrillation, heart rate was mildly elevated and blood pressure was borderline.  She was started on amiodarone  200 mg twice a day for 2 weeks then 200 mg daily thereafter with recommendation to proceed with cardioversion in approximately 4 weeks if A-fib persisted.  Her Eliquis  was increased to 5 mg twice daily in anticipation of cardioversion.  On 10/09/2023 patient presented reporting shortness of breath, at office visit patient noted that this was a chronic and ongoing issue denied any significant changes.  She reported that she had not  been using her inhalers as a pharmacist instructed her not to use them, is recommended that she discuss with her pulmonologist.  On chart review patient has presented to [facility] on 4/8 after  her pulmonologist office recommended she present to the emergency department given increased shortness of breath, patient reported to the ED that she has chronic shortness of breath.  Patient workup was reassuring including troponins flat and within normal limits x 3.  Patient was discharged in stable condition.  On/15/25 she presented back to [facility] with complaints of a headache that had been ongoing for multiple months, she felt that it had worsened in the last 2 days, patient had undergone MRI on 09/09/2023 with no acute abnormalities.  Patient lab work was overall reassuring and patient noted improvement prior to discharge.   Patient was last in clinic on/16/2025.  She remained in atrial fibrillation versus atrial tachycardia, was scheduled for cardioversion.  Patient underwent cardioversion on 10/27/23 that was successful.    Labwork independently reviewed:   ROS: .   *** denies chest pain, shortness of breath, lower extremity edema, fatigue, palpitations, melena, hematuria, hemoptysis, diaphoresis, weakness, presyncope, syncope, orthopnea, and PND.  All other systems are reviewed and otherwise negative.  Studies Reviewed: Aaron Aas    EKG:  EKG is ordered today, personally reviewed, demonstrating ***     CV Studies: Cardiac studies reviewed are outlined and summarized above. Otherwise please see EMR for full report. Cardiac Studies & Procedures   ______________________________________________________________________________________________ CARDIAC CATHETERIZATION  CARDIAC CATHETERIZATION 12/02/2019  Conclusion  Prox RCA lesion is 20% stenosed.  Dist LAD lesion is 20% stenosed.  1. Mild non-obstructive CAD  Recommendation: Continue planning for mitral valve repair.  Findings Coronary Findings Diagnostic  Dominance: Right  Left Anterior Descending Vessel is large. Dist LAD lesion is 20% stenosed.  Left Circumflex Vessel is small.  Right Coronary Artery Vessel is large. Prox  RCA lesion is 20% stenosed.  Intervention  No interventions have been documented.     ECHOCARDIOGRAM  ECHOCARDIOGRAM COMPLETE 01/27/2023  Narrative ECHOCARDIOGRAM REPORT    Patient Name:   KELSYE LOOMER Date of Exam: 01/27/2023 Medical Rec #:  [id number]   Height:       66.0 in Accession #:    [id number]  Weight:       114.2 lb Date of Birth:  03/21/1946   BSA:          1.576 m Patient Age:    61 years    BP:           100/60 mmHg Patient Gender: F           HR:           65 bpm. Exam Location:  Outpatient  Procedure: 2D Echo, Cardiac Doppler and Color Doppler  Indications:    S/P Mitral Valve repair Z98.89  History:        Patient has prior history of Echocardiogram examinations, most recent 02/12/2022. Arrythmias:Atrial Fibrillation; Signs/Symptoms:Shortness of Breath.  Mitral Valve: 32 mm Sorin Memo 4D prosthetic annuloplasty ring valve is present in the mitral position. Procedure Date: 01/17/20.  Sonographer:    Ruta Cousins RDCS Referring Phys: [id number] Debria Fang   Sonographer Comments: Image acquisition challenging due to patient body habitus. IMPRESSIONS   1. Left ventricular ejection fraction, by estimation, is 55 to 60%. The left ventricle has normal function. Left ventricular endocardial border not optimally defined to evaluate regional wall motion. There is mild left ventricular hypertrophy. Left  ventricular diastolic function could not be evaluated. There is the interventricular septum is flattened in systole, consistent with right ventricular pressure overload. 2. Right ventricular systolic function is moderately reduced. The right ventricular size is moderately enlarged. There is moderately elevated pulmonary artery systolic pressure. The estimated right ventricular systolic pressure is 48.9 mmHg. 3. Right atrial size was moderately dilated. 4. The mitral valve has been repaired/replaced. Trivial mitral valve regurgitation. The mean mitral valve  gradient is 2.0 mmHg with average heart rate of 63 bpm. There is a 32 mm Sorin Memo 4D prosthetic annuloplasty ring present in the mitral position. Procedure Date: 01/17/20. Echo findings are consistent with normal structure and function of the mitral valve prosthesis. 5. Tricuspid valve regurgitation is moderate. 6. The aortic valve is tricuspid. There is mild calcification of the aortic valve. There is mild thickening of the aortic valve. Aortic valve regurgitation is mild. Aortic valve sclerosis is present, with no evidence of aortic valve stenosis. 7. Pulmonic valve regurgitation is moderate. 8. The inferior vena cava is dilated in size with <50% respiratory variability, suggesting right atrial pressure of 15 mmHg.  FINDINGS Left Ventricle: Left ventricular ejection fraction, by estimation, is 55 to 60%. The left ventricle has normal function. Left ventricular endocardial border not optimally defined to evaluate regional wall motion. The left ventricular internal cavity size was normal in size. There is mild left ventricular hypertrophy. The interventricular septum is flattened in systole, consistent with right ventricular pressure overload. Left ventricular diastolic function could not be evaluated.  Right Ventricle: The right ventricular size is moderately enlarged. No increase in right ventricular wall thickness. Right ventricular systolic function is moderately reduced. There is moderately elevated pulmonary artery systolic pressure. The tricuspid regurgitant velocity is 2.91 m/s, and with an assumed right atrial pressure of 15 mmHg, the estimated right ventricular systolic pressure is 48.9 mmHg.  Left [facility]: Left atrial size was normal in size.  Right [facility]: Right atrial size was moderately dilated.  Pericardium: There is no evidence of pericardial effusion.  Mitral Valve: The mitral valve has been repaired/replaced. Trivial mitral valve regurgitation. There is a 32 mm Sorin Memo 4D  prosthetic annuloplasty ring present in the mitral position. Procedure Date: 01/17/20. Echo findings are consistent with normal structure and function of the mitral valve prosthesis. MV peak gradient, 8.8 mmHg. The mean mitral valve gradient is 2.0 mmHg with average heart rate of 63 bpm.  Tricuspid Valve: The tricuspid valve is normal in structure. Tricuspid valve regurgitation is moderate.  Aortic Valve: The aortic valve is tricuspid. There is mild calcification of the aortic valve. There is mild thickening of the aortic valve. Aortic valve regurgitation is mild. Aortic valve sclerosis is present, with no evidence of aortic valve stenosis.  Pulmonic Valve: The pulmonic valve was normal in structure. Pulmonic valve regurgitation is moderate.  Aorta: The aortic root is normal in size and structure.  Venous: The inferior vena cava is dilated in size with less than 50% respiratory variability, suggesting right atrial pressure of 15 mmHg.  IAS/Shunts: The atrial septum is grossly normal.   LEFT VENTRICLE PLAX 2D LVIDd:         2.90 cm LVIDs:         2.10 cm LV PW:         1.10 cm LV IVS:        1.00 cm LVOT diam:     1.90 cm LV SV:         42 LV SV Index:  27 LVOT Area:     2.84 cm  LV Volumes (MOD) LV vol d, MOD A2C: 59.7 ml LV vol d, MOD A4C: 44.9 ml LV vol s, MOD A2C: 23.1 ml LV vol s, MOD A4C: 14.9 ml LV SV MOD A2C:     36.6 ml LV SV MOD A4C:     44.9 ml LV SV MOD BP:      33.8 ml  RIGHT VENTRICLE RV S prime:     8.99 cm/s TAPSE (M-mode): 1.5 cm  LEFT ATRIUM             Index        RIGHT ATRIUM           Index LA diam:        3.10 cm 1.97 cm/m   RA Area:     20.30 cm LA Vol (A2C):   36.9 ml 23.41 ml/m  RA Volume:   68.10 ml  43.21 ml/m LA Vol (A4C):   40.9 ml 25.95 ml/m LA Biplane Vol: 40.0 ml 25.38 ml/m AORTIC VALVE                   PULMONIC VALVE LVOT Vmax:         70.90 cm/s  PR End Diast Vel: 6.15 msec LVOT Vmean:        43.400 cm/s LVOT VTI:           0.149 m AR Vena Contracta: 0.30 cm  AORTA Ao Root diam: 3.00 cm Ao Asc diam:  3.00 cm  MITRAL VALVE              TRICUSPID VALVE MV Area (PHT): 2.89 cm   TR Peak grad:   33.9 mmHg MV Area VTI:   1.19 cm   TR Vmax:        291.00 cm/s MV Peak grad:  8.8 mmHg MV Mean grad:  2.0 mmHg   SHUNTS MV Vmax:       1.48 m/s   Systemic VTI:  0.15 m MV Vmean:      62.3 cm/s  Systemic Diam: 1.90 cm  Grady Lawman MD Electronically signed by Grady Lawman MD Signature Date/Time: 01/28/2023/9:53:11 PM    Final   TEE  ECHO TEE 01/26/2020  Narrative TRANSESOPHOGEAL ECHO REPORT    Patient Name:   NAZIA RHINES Date of Exam: 01/26/2020 Medical Rec #:  [id number]   Height:       66.0 in Accession #:    [id number]  Weight:       136.7 lb Date of Birth:  05/07/1946   BSA:          1.701 m Patient Age:    74 years    BP:           118/66 mmHg Patient Gender: F           HR:           123 bpm. Exam Location:  Inpatient  Procedure: TEE-Intraopertive and 3D Echo  Indications:     atrial fibrillation  History:         Patient has prior history of Echocardiogram examinations, most recent 11/21/2019. Arrythmias:Atrial Fibrillation.  Mitral Valve: 32 mm prosthetic annuloplasty ring valve is present in the mitral position. Procedure Date: 01/17/2020.  Sonographer:     Dione Franks Referring Phys:  [id number] Tylene Galla Diagnosing Phys: Jackquelyn Mass MD  PROCEDURE: After discussion of the risks and benefits of a TEE, an informed  consent was obtained from the patient. TEE procedure time was 20 minutes. The transesophogeal probe was passed without difficulty through the esophogus of the patient. Imaged were obtained with the patient in a left lateral decubitus position. Local oropharyngeal anesthetic was provided with Cetacaine . Sedation performed by different physician. The patient was monitored while under deep sedation. Anesthestetic sedation was provided intravenously by  Anesthesiology: 270mg  of Propofol . Image quality was excellent. The patient's vital signs; including heart rate, blood pressure, and oxygen saturation; remained stable throughout the procedure. The patient developed no complications during the procedure. A successful direct current cardioversion was performed at 200 joules with 1 attempt.  IMPRESSIONS   1. Left ventricular ejection fraction, by estimation, is 55 to 60%. The left ventricle has normal function. The left ventricle has no regional wall motion abnormalities. 2. Right ventricular systolic function is normal. The right ventricular size is normal. 3. S/p surgical LAA clipping. No residual connection noted with the LA. No LA thrombus. No left atrial/left atrial appendage thrombus was detected. 4. 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 No residual MR. MVA by direct 3D MPR assessment 2.6 cm2. The mitral valve has been repaired/replaced. No evidence of mitral valve regurgitation. No evidence of mitral stenosis. The mean mitral valve gradient is 4.0 mmHg with average heart rate of 111 bpm. There is a 32 mm prosthetic annuloplasty ring present in the mitral position. Procedure Date: 01/17/2020. 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 6. The aortic valve is tricuspid. Aortic valve regurgitation is not visualized. No aortic stenosis is present.  Conclusion(s)/Recommendation(s): No LA/LAA thrombus identified. Successful cardioversion performed with restoration of normal sinus rhythm.  FINDINGS Left Ventricle: Left ventricular ejection fraction, by estimation, is 55 to 60%. The left ventricle has normal function. The left ventricle has no regional wall motion abnormalities. The left ventricular internal cavity size was normal in size. There is no left ventricular hypertrophy.  Right Ventricle: The right ventricular size is normal. No increase in right ventricular wall thickness. Right ventricular systolic function is normal.  Left [facility]: S/p  surgical LAA clipping. No residual connection noted with the LA. No LA thrombus. Left atrial size was normal in size. No left atrial/left atrial appendage thrombus was detected.  Right [facility]: Right atrial size was normal in size.  Pericardium: Trivial pericardial effusion is present.  Mitral Valve: S/p MV repair with 32 mm annuloplasty ring. No residual MR. MVA by direct 3D MPR assessment 2.6 cm2. The mitral valve has been repaired/replaced. No evidence of mitral valve regurgitation. There is a 32 mm prosthetic annuloplasty ring present in the mitral position. Procedure Date: 01/17/2020. No evidence of mitral valve stenosis. The mean mitral valve gradient is 4.0 mmHg with average heart rate of 111 bpm.  Tricuspid Valve: The tricuspid valve is myxomatous. Tricuspid valve regurgitation is mild . No evidence of tricuspid stenosis.  Aortic Valve: The aortic valve is tricuspid. Aortic valve regurgitation is not visualized. No aortic stenosis is present.  Pulmonic Valve: The pulmonic valve was grossly normal. Pulmonic valve regurgitation is mild. No evidence of pulmonic stenosis.  Aorta: The aortic root, ascending aorta, aortic arch and descending aorta are all structurally normal, with no evidence of dilitation or obstruction.  Venous: The left upper pulmonary vein, left lower pulmonary vein and right upper pulmonary vein are normal.  IAS/Shunts: There is redundancy of the interatrial septum. No atrial level shunt detected by color flow Doppler.  Additional Comments: There is a small pleural effusion in the left lateral region.  AORTA Ao Root diam: 3.20 cm Ao Asc diam:  2.80 cm  MITRAL VALVE           TRICUSPID VALVE MV Mean grad: 4.0 mmHg TR Peak grad:   25.0 mmHg TR Vmax:        250.00 cm/s  Jackquelyn Mass MD Electronically signed by Jackquelyn Mass MD Signature Date/Time: 01/26/2020/11:22:07 AM    Final         ______________________________________________________________________________________________       Current Reported Medications:.    No outpatient medications have been marked as taking for the 12/02/23 encounter (Appointment) with Anthon Harpole D, NP.    Physical Exam:    VS:  There were no vitals taken for this visit.   Wt Readings from Last 3 Encounters:  11/25/23 103 lb 6.4 oz (46.9 kg)  11/13/23 107 lb (48.5 kg)  11/10/23 113 lb 6.4 oz (51.4 kg)    GEN: Well nourished, well developed in no acute distress NECK: No JVD; No carotid bruits CARDIAC: ***RRR, no murmurs, rubs, gallops RESPIRATORY:  Clear to auscultation without rales, wheezing or rhonchi  ABDOMEN: Soft, non-tender, non-distended EXTREMITIES:  No edema; No acute deformity     Asessement and Plan:.     ***     Disposition: F/u with ***  Signed, Wilfred Dayrit D Hadiyah Maricle, NP

## 2023-12-01 ENCOUNTER — Encounter: Payer: Self-pay | Admitting: General Practice

## 2023-12-01 ENCOUNTER — Other Ambulatory Visit (INDEPENDENT_AMBULATORY_CARE_PROVIDER_SITE_OTHER)

## 2023-12-01 ENCOUNTER — Ambulatory Visit: Attending: General Practice | Admitting: General Practice

## 2023-12-01 ENCOUNTER — Telehealth: Payer: Self-pay | Admitting: Student

## 2023-12-01 ENCOUNTER — Other Ambulatory Visit: Payer: Self-pay | Admitting: Emergency Medicine

## 2023-12-01 VITALS — BP 118/64 | HR 55 | Ht 66.0 in | Wt 106.4 lb

## 2023-12-01 DIAGNOSIS — Z9889 Other specified postprocedural states: Secondary | ICD-10-CM | POA: Diagnosis not present

## 2023-12-01 DIAGNOSIS — I10 Essential (primary) hypertension: Secondary | ICD-10-CM | POA: Insufficient documentation

## 2023-12-01 DIAGNOSIS — R0602 Shortness of breath: Secondary | ICD-10-CM | POA: Diagnosis not present

## 2023-12-01 DIAGNOSIS — R0609 Other forms of dyspnea: Secondary | ICD-10-CM | POA: Diagnosis not present

## 2023-12-01 DIAGNOSIS — I4819 Other persistent atrial fibrillation: Secondary | ICD-10-CM | POA: Insufficient documentation

## 2023-12-01 DIAGNOSIS — I5032 Chronic diastolic (congestive) heart failure: Secondary | ICD-10-CM | POA: Insufficient documentation

## 2023-12-01 LAB — BASIC METABOLIC PANEL WITH GFR
BUN: 30 mg/dL — ABNORMAL HIGH (ref 6–23)
CO2: 39 meq/L — ABNORMAL HIGH (ref 19–32)
Calcium: 9.9 mg/dL (ref 8.4–10.5)
Chloride: 98 meq/L (ref 96–112)
Creatinine, Ser: 1.02 mg/dL (ref 0.40–1.20)
GFR: 52.39 mL/min — ABNORMAL LOW (ref >60.00)
Glucose, Bld: 81 mg/dL (ref 70–99)
Potassium: 4.3 meq/L (ref 3.5–5.1)
Sodium: 143 meq/L (ref 135–145)

## 2023-12-01 NOTE — Telephone Encounter (Signed)
 I called and spoke with the pt and advised her that yes, she does need to come in and complete labs today to recheck her K. Pt verbalized understanding. Nothing further needed.

## 2023-12-01 NOTE — Telephone Encounter (Signed)
 PT is calling because [name] ordered blood work for her and she is coming into [place] today and wonders if [name] wanted her to come in TODAY for the bloodwork since she will be in town. Please call ASAP. She is very nervous about this and wants a call before she leaves the house.

## 2023-12-01 NOTE — Patient Instructions (Signed)
 Medication Instructions:  The current medical regimen is effective;  continue present plan and medications as directed. Please refer to the Current Medication list given to you today.  *If you need a refill on your cardiac medications before your next appointment, please call your pharmacy*  Lab Work:   Testing/Procedures: NONE    NONE  Follow-Up: At [facility], you and your health needs are our priority.  As part of our continuing mission to provide you with exceptional heart care, our providers are all part of one team.  This team includes your primary Cardiologist (physician) and Advanced Practice Providers or APPs (Physician Assistants and Nurse Practitioners) who all work together to provide you with the care you need, when you need it.  Your next appointment:   2-3 month(s)  Provider:   Alexandria Angel, MD or Lawana Pray, NP or Katlyn West, NP

## 2023-12-01 NOTE — Progress Notes (Signed)
 Cardiology Clinic Note   Patient Name: [name] Date of Encounter: 12/01/2023  Primary Care Provider:  Jess Morita, MD Primary Cardiologist:  Alexandria Angel, MD  Patient Profile    [name] 78 year old female presents to the clinic today for follow-up evaluation of her chronic diastolic CHF and shortness of breath.  Past Medical History    Past Medical History:  Diagnosis Date   Allergies    Anxiety    Arthritis    "maybe in my back" (03/31/2018)   Benign paroxysmal positional vertigo 06/08/2013   Complication of anesthesia    Fracture of multiple ribs 2015   "don't know from what; dx'd when I in hospital for 1st back OR" (03/31/2018)   GERD (gastroesophageal reflux disease)    Hair loss 04/12/2012   Herpes    History of blood transfusion    "twice; related to back OR" (03/31/2018)   History of kidney stones    Interstitial cystitis 11/06/2011   Melanoma of ankle (HCC) ~ 2003   "right"   Mitral regurgitation    Osteopenia 02/18/2012   Osteoporosis    PAF (paroxysmal atrial fibrillation) (HCC) 2012   Peripheral neuropathy 11/06/2011   PMDD (premenstrual dysphoric disorder)    PONV (postoperative nausea and vomiting)    nausea, vomiting, hives and dizziness    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/2020   Complete bilateral atrial lesion set using cryothermy and bipolar radiofrequency ablation with clipping of LA appendage via right mini-thoracotomy approach   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   Complex valvuloplasty including artificial Gore-tex neochord placement x12 with 32mm Sorin Memo 4D ring annuloplasty   Seasonal allergies    Vaginal delivery    ONE NSVD   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   Past Surgical History:  Procedure Laterality Date   ANTERIOR CERVICAL DECOMP/DISCECTOMY FUSION  ~ 2003   BACK SURGERY     BREAST SURGERY     BREAST BIOPSY--RIGHT BENIGN   BUNIONECTOMY Bilateral    CARDIOVERSION N/A 01/26/2020   Procedure:  CARDIOVERSION;  Surgeon: Harrold Lincoln, MD;  Location: [facility] ENDOSCOPY;  Service: Cardiovascular;  Laterality: N/A;   CARDIOVERSION N/A 10/27/2023   Procedure: CARDIOVERSION;  Surgeon: Jacqueline Matsu, MD;  Location: [facility] INVASIVE CV LAB;  Service: Cardiovascular;  Laterality: N/A;   COSMETIC SURGERY  2016   "back of my neck; related to earlier fusion"   CYSTOSCOPY W/ STONE MANIPULATION  "several times"   DILATION AND CURETTAGE OF UTERUS     FOREHEAD RECONSTRUCTION Right    "removed bone protruding out of my forehead"   HARDWARE REMOVAL  2016   "related to neck OR"   INCONTINENCE SURGERY     IR THORACENTESIS ASP PLEURAL SPACE W/IMG GUIDE  02/16/2020   MINIMALLY INVASIVE MAZE PROCEDURE N/A 01/17/2020   Procedure: MINIMALLY INVASIVE MAZE PROCEDURE;  Surgeon: Gardenia Jump, MD;  Location: [facility] OR;  Service: Open Heart Surgery;  Laterality: N/A;   MITRAL VALVE REPAIR Right 01/17/2020   Procedure: MINIMALLY INVASIVE MITRAL VALVE REPAIR (MVR) USING MEMO 4D ;  Surgeon: Gardenia Jump, MD;  Location: [facility] OR;  Service: Open Heart Surgery;  Laterality: Right;   POSTERIOR CERVICAL FUSION/FORAMINOTOMY  ~ 2008; 2015   RIGHT/LEFT HEART CATH AND CORONARY ANGIOGRAPHY N/A 12/02/2019   Procedure: RIGHT/LEFT HEART CATH AND CORONARY ANGIOGRAPHY;  Surgeon: Odie Benne, MD;  Location: [facility] INVASIVE CV LAB;  Service: Cardiovascular;  Laterality: N/A;   SHOULDER ARTHROSCOPY W/ ROTATOR CUFF REPAIR Right  2012   SPINAL FUSION  06/2014 - 2018 X ?7   "scoliosis; my entire back"   TEE WITHOUT CARDIOVERSION N/A 11/21/2019   Procedure: TRANSESOPHAGEAL ECHOCARDIOGRAM (TEE);  Surgeon: Loyde Rule, MD;  Location: [facility] ENDOSCOPY;  Service: Cardiovascular;  Laterality: N/A;   TEE WITHOUT CARDIOVERSION N/A 01/17/2020   Procedure: TRANSESOPHAGEAL ECHOCARDIOGRAM (TEE);  Surgeon: Gardenia Jump, MD;  Location: [facility] OR;  Service: Open Heart Surgery;  Laterality: N/A;   TEE WITHOUT CARDIOVERSION N/A 01/26/2020    Procedure: TRANSESOPHAGEAL ECHOCARDIOGRAM (TEE);  Surgeon: Harrold Lincoln, MD;  Location: [facility] ENDOSCOPY;  Service: Cardiovascular;  Laterality: N/A;   TUBAL LIGATION     VAGINAL HYSTERECTOMY     TVH    Allergies  Allergies  Allergen Reactions   Buprenorphine Nausea Only and Other (See Comments)    sedation and adhesive reaction--patches    Erythromycin Nausea And Vomiting and Other (See Comments)    Dizziness     Iodinated Contrast Media Hives, Nausea Only and Rash   Latex Palpitations, Rash and Other (See Comments)    Hives/sores   Other Other (See Comments)    CAN NOT TAKE , aLPRAZOLAM, OR ELAVIL DUE TO AFIB FOR ANXIETY   Steroids-"jittery/hyper" "wants to crawl out of skin"   Tape Hives, Itching, Rash and Other (See Comments)    Heart monitor stickers must be rotated in order to prevent rash Adhesive on EKG tabs=BURNS SKIN**PAPER TAPE OK** sores     Ciprofloxacin  Other (See Comments)    Burning all over   Duloxetine  Hcl Other (See Comments)    Severe diarrhea and upset stomach   Gabapentin  Other (See Comments)    Unknown--unsteady gait/felt intoxicated   Hydrocodone Other (See Comments)    Sedation,dizziness, and nausea   Hydromorphone  Nausea Only and Other (See Comments)    Sedation,dizziness, and nausea   Iodine Hives   Levofloxacin Nausea And Vomiting and Nausea Only   Metrizamide Hives   Nsaids     CANNOT TAKE PER CARDIOLOGIST DUE TO AFIB    Nucynta [Tapentadol Hcl] Nausea And Vomiting and Other (See Comments)    nausea and sedation   Oxycodone  Itching, Nausea Only and Other (See Comments)    Delusions (intolerance)  PILLS ONLY sedation, dizziness, nausea,  and itching   Pentazocine Other (See Comments)    Unknown    Pregabalin Nausea Only    Unknown    Sulfasalazine Hives   Morphine Nausea Only, Anxiety and Other (See Comments)    sedation and nausea delusions (intolerance) PT CANNOT WAKE UP AFTER TAKING MEDICATION AND HAS NIGHTMARES     Sulfa  Antibiotics Hives, Nausea Only and Rash   Sulfamethoxazole-Trimethoprim Hives and Rash    History of Present Illness    [name] has a PMH of chronic dyspnea, paroxysmal atrial fibrillation, mitral valve repair, and coronary artery disease.  Echocardiogram 1/21 showed an EF of 60 to 65%, moderate mitral valve regurgitation with bileaflet prolapse.  She underwent TEE 5/21 which showed severe mitral valve regurgitation and flail segment.  She underwent right and left heart cath 12/02/2019 which showed mild nonobstructive CAD 20% stenosis in the proximal RCA and distal LAD.  She was seen by Dr. Lander Pines who performed minimally invasive mitral valve repair and maze procedure.  Postoperatively she had acute blood loss anemia which required transfusion and atrial fibrillation with RVR.  She was placed on amiodarone .  Her Tikosyn  and Lopressor  were discontinued.  Later her amiodarone  was discontinued and she was  started on apixaban  and metoprolol .  She continue to follow-up with cardiology regularly.  [facility] 10/13/2023 after pulmonologist office recommended that she present to the emergency department due to her increased shortness of breath.  In the emergency department she reported that she has chronic shortness of breath.  Her workup was reassuring.  Her cardiac troponins were low and flat.  She was discharged in stable condition.  On 07/22/2023 she presented back to the emergency department with complaints of headache that had been ongoing for multiple months.  She noted that her headaches had gotten worse over the last 2 days.  MRI was ordered 09/09/2023.  No acute abnormalities were noted.  Her lab work was reassuring.  She was seen in clinic 07/23/2023.  She remained in atrial fibrillation for sinus tachycardia and was scheduled for cardioversion.  She underwent cardioversion 07/29/2023 which was successful.  She followed up with  [facility] NP on 11/10/2023.  During that time she  was stable from a cardiac standpoint.  She denied palpitations.  She noted increased lower extremity edema.  She felt significantly better following cardioversion.  She was tolerating her medications well.  She denied bleeding issues.  She continued to note intermittent headaches.  She noted swelling in her feet.  She had been taking her Lasix  as prescribed.  She did note some dietary indiscretion with eating out regularly.  She was also eating microwavable meals.  She paged the after-hours provider line on 11/27/2023.  She had concern about her recent blood work from 11/25/2023.  This had been completed by her pulmonologist.  Her blood work was reviewed and showed slowly uptrending creatinine.  Her proBNP was elevated at 290.  Her previous proBNP was noted to be over 1200 back in 2023.  It was unsure if she was fluid volume up or not.  Physical exam was recommended.  She had been instructed by her pulmonologist to hold potassium supplement for 3 days due to potassium of 5.3.  She presents to the clinic today for follow-up evaluation and states she was worried all weekend.  She reports that she had a call from pulmonology about her recent lab work.  We reviewed this.  She expressed understanding.  She was noted to have a slight increase in her creatinine and BNP was slightly elevated but less than previous.  On exam today she does have some mild ankle edema.  She is able to converse in full sentences without shortness of breath.  Her weight is stable.  We reviewed her medications.  She expressed understanding.  Her main complaint today is with her neck and intermittent neck pain.  I will continue her current medication regimen and plan follow-up in 2 to 3 months.  Today she denies chest pain, shortness of breath,  fatigue, palpitations, melena, hematuria, hemoptysis, diaphoresis, weakness, presyncope, syncope, orthopnea, and PND.   Home Medications    Prior to Admission medications   Medication Sig Start  Date End Date Taking? Authorizing Provider  acetaminophen  (TYLENOL ) 500 MG tablet Take 500 mg by mouth every 6 (six) hours as needed (pain.).    [provider]  amiodarone  (PACERONE ) 200 MG tablet Take 200 mg by mouth daily.    [provider]  apixaban  (ELIQUIS ) 5 MG TABS tablet Take 1 tablet (5 mg total) by mouth 2 (two) times daily. 11/16/23   West, Katlyn D, NP  calcium  carbonate (OS-CAL) 1250 (500 Ca) MG chewable tablet Chew 1 tablet by mouth  every evening.    [provider]  Calcium  Carbonate Antacid (TUMS CHEWY BITES PO) Take 1 tablet by mouth daily as needed (reflux).     [provider]  Carboxymethylcellul-Glycerin  (LUBRICATING EYE DROPS OP) Place 1 drop into both eyes 3 (three) times daily as needed (dry/irritated eys.).    [provider]  cephALEXin  (KEFLEX ) 500 MG capsule Take 2 grams (4 tablets) by mouth 1 hour prior to dental cleaning/procedure. 03/27/22   Sonny Dust, MD  Cholecalciferol (VITAMIN D -3 PO) Take 1 tablet by mouth every evening.    [provider]  clonazePAM  (KLONOPIN ) 0.5 MG tablet Take 1 tablet by mouth twice daily as needed for anxiety Patient taking differently: Take 0.125-0.25 mg by mouth 2 (two) times daily as needed for anxiety. 01/26/23   Tabori, Katherine E, MD  cyanocobalamin  (VITAMIN B12) 1000 MCG/ML injection Inject 1,000 mcg into the muscle every 30 (thirty) days. 07/09/23   [provider]  denosumab  (PROLIA ) 60 MG/ML SOSY injection Inject 60 mg into the skin every 6 (six) months.    [provider]  furosemide  (LASIX ) 20 MG tablet Take 20 mg by mouth every Tuesday, Thursday, and Saturday at 6 PM. Per patient taking 60 mg on these days    [provider]  furosemide  (LASIX ) 40 MG tablet Take 1 tablet (40 mg total) by mouth daily. 09/23/23 12/22/23  Lenise Quince, MD  levalbuterol  (XOPENEX  HFA) 45 MCG/ACT inhaler Inhale 2 puffs into the lungs every 4 (four) hours as  needed for wheezing. 10/26/23 10/25/24  Wilfredo Hanly, MD  levothyroxine  (SYNTHROID ) 75 MCG tablet Take 1 tablet by mouth once daily 10/26/23   Tabori, Katherine E, MD  loratadine (CLARITIN) 10 MG tablet Take 10 mg by mouth in the morning.    [provider]  meclizine  (ANTIVERT ) 25 MG tablet TAKE 1 TABLET BY MOUTH THREE TIMES DAILY AS NEEDED FOR DIZZINESS 08/24/23   Tabori, Katherine E, MD  metoprolol  tartrate (LOPRESSOR ) 25 MG tablet Take 1 tablet by mouth twice daily 10/07/23   Lenise Quince, MD  mupirocin ointment (BACTROBAN) 2 % Apply 1 Application topically daily. 08/14/22   [provider]  nystatin  (MYCOSTATIN /NYSTOP ) powder Apply topically 2 (two) times daily as needed (skin irritation (under breasts)). 02/08/20   Love, Renay Carota, PA-C  ondansetron  (ZOFRAN ) 8 MG tablet Take 1 tablet (8 mg total) by mouth every 8 (eight) hours as needed for nausea or vomiting. 07/24/23   Tabori, Katherine E, MD  oxyCODONE  (ROXICODONE ) 5 MG/5ML solution Take 4 mLs (4 mg total) by mouth every 4 (four) hours as needed for moderate pain. Patient taking differently: Take 5 mg by mouth every 4 (four) hours as needed (pain.). 02/08/20   Love, Renay Carota, PA-C  pantoprazole  (PROTONIX ) 40 MG tablet Take 1 tablet by mouth once daily 05/29/23   Dewald, Jonathan B, MD  polyethylene glycol (MIRALAX  / GLYCOLAX ) packet Take 17 g by mouth daily as needed for mild constipation.    [provider]  potassium chloride  SA (KLOR-CON  M) 20 MEQ tablet Take 1 tablet (20 mEq total) by mouth daily. 11/25/23   West, Katlyn D, NP  revefenacin  (YUPELRI ) 175 MCG/3ML nebulizer solution Inhale one vial in nebulizer once daily. Do not mix with other nebulized medications. 11/18/23   Wilfredo Hanly, MD  sodium chloride  (OCEAN) 0.65 % SOLN nasal spray Place 1 spray into both nostrils as needed for congestion (nose bleeds).    [provider]  Ubrogepant (  UBRELVY) 100 MG TABS Take 25 mg by mouth daily as needed  (headaches/migraines). Patient not taking: Reported on 11/25/2023    [provider]  valACYclovir  (VALTREX ) 500 MG tablet Take 500 mg by mouth daily as needed (breakouts). 07/15/19   [provider]    Family History    Family History  Problem Relation Age of Onset   Diabetes Father    Hyperlipidemia Sister    Heart disease Sister    Stroke Sister    Diabetes Brother    Hyperlipidemia Sister    Heart disease Sister    Arthritis Mother    Heart disease Mother    Uterine cancer Mother    Diabetes Brother    Heart Problems Brother    She indicated that her mother is deceased. She indicated that her father is deceased. She indicated that all of her four sisters are alive. She indicated that three of her four brothers are alive. She indicated that her maternal grandmother is deceased. She indicated that her maternal grandfather is deceased. She indicated that her paternal grandmother is deceased. She indicated that her paternal grandfather is deceased. She indicated that her son is alive.  Social History    Social History   Socioeconomic History   Marital status: Divorced    Spouse name: none/divorced   Number of children: Not on file   Years of education: Not on file   Highest education level: 12th grade  Occupational History   Occupation: retired  Tobacco Use   Smoking status: Former    Current packs/day: 0.00    Average packs/day: 0.1 packs/day for 14.0 years (1.4 ttl pk-yrs)    Types: Cigarettes    Start date: 1966    Quit date: 1980    Years since quitting: 45.4    Passive exposure: Past   Smokeless tobacco: Never   Tobacco comments:    03/31/2018 "quit ~ 1980; someday smoker when I did smoke; never addicted"  Vaping Use   Vaping status: Never Used  Substance and Sexual Activity   Alcohol  use: Never   Drug use: Yes    Types: Oxycodone , Benzodiazepines    Comment: 03/31/2018 "for chronic neck and back pain", takes Klonopin  at times.    Sexual  activity: Not Currently  Other Topics Concern   Not on file  Social History Narrative   Lives by herself.    Social Drivers of Health   Financial Resource Strain: Medium Risk (10/12/2023)   Overall Financial Resource Strain (CARDIA)    Difficulty of Paying Living Expenses: Somewhat hard  Food Insecurity: Food Insecurity Present (10/12/2023)   Hunger Vital Sign    Worried About Running Out of Food in the Last Year: Sometimes true    Ran Out of Food in the Last Year: Never true  Transportation Needs: No Transportation Needs (10/12/2023)   PRAPARE - Administrator, Civil Service (Medical): No    Lack of Transportation (Non-Medical): No  Physical Activity: Inactive (10/23/2022)   Exercise Vital Sign    Days of Exercise per Week: 0 days    Minutes of Exercise per Session: 0 min  Stress: Stress Concern Present (10/23/2022)   [employer] of Occupational Health - Occupational Stress Questionnaire    Feeling of Stress : To some extent  Social Connections: Socially Isolated (10/23/2022)   Social Connection and Isolation Panel [NHANES]    Frequency of Communication with Friends and Family: More than three times a week    Frequency of  Social Gatherings with Friends and Family: Once a week    Attends Religious Services: Never    Database administrator or Organizations: No    Attends Banker Meetings: Never    Marital Status: Divorced  Catering manager Violence: Not At Risk (10/13/2023)   Received from [facility]   HITS    Over the last 12 months how often did your partner physically hurt you?: Never    Over the last 12 months how often did your partner insult you or talk down to you?: Never    Over the last 12 months how often did your partner threaten you with physical harm?: Never    Over the last 12 months how often did your partner scream or curse at you?: Never     Review of Systems    General:  No chills, fever, night sweats or weight changes.   Cardiovascular:  No chest pain, dyspnea on exertion, edema, orthopnea, palpitations, paroxysmal nocturnal dyspnea. Dermatological: No rash, lesions/masses Respiratory: No cough, dyspnea Urologic: No hematuria, dysuria Abdominal:   No nausea, vomiting, diarrhea, bright red blood per rectum, melena, or hematemesis Neurologic:  No visual changes, wkns, changes in mental status. All other systems reviewed and are otherwise negative except as noted above.  Physical Exam    VS:  BP 118/64   Pulse (!) 55   Ht 5\' 6"  (1.676 m)   Wt 106 lb 6.4 oz (48.3 kg)   SpO2 95%   BMI 17.17 kg/m  , BMI Body mass index is 17.17 kg/m. GEN: Well nourished, well developed, in no acute distress. HEENT: normal. Neck: Supple, no JVD, carotid bruits, or masses. Cardiac: RRR, no murmurs, rubs, or gallops. No clubbing, cyanosis, pedal/ankle edema.  Radials/DP/PT 2+ and equal bilaterally.  Respiratory:  Respirations regular and unlabored, clear to auscultation bilaterally. GI: Soft, nontender, nondistended, BS + x 4. MS: no deformity or atrophy. Skin: warm and dry, no rash. Neuro:  Strength and sensation are intact. Psych: Normal affect.  Accessory Clinical Findings    Recent Labs: 01/22/2023: BNP 253.3 07/24/2023: ALT 17 11/25/2023: BUN 32; Creatinine, Ser 1.23; Hemoglobin 13.5; Platelets 198.0; Potassium 5.3; Pro B Natriuretic peptide (BNP) 296.0; Sodium 142; TSH 1.38   Recent Lipid Panel    Component Value Date/Time   CHOL 175 12/10/2020 1338   TRIG 79.0 12/10/2020 1338   HDL 80.40 12/10/2020 1338   CHOLHDL 2 12/10/2020 1338   VLDL 15.8 12/10/2020 1338   LDLCALC 79 12/10/2020 1338   LDLDIRECT 136.5 06/08/2013 1049         ECG personally reviewed by me today-none today.    CARDIAC CATHETERIZATION 12/02/2019   Conclusion  Prox RCA lesion is 20% stenosed.  Dist LAD lesion is 20% stenosed.   1. Mild non-obstructive CAD   Recommendation: Continue planning for mitral valve repair.    Findings Coronary Findings Diagnostic  Dominance: Right   Left Anterior Descending Vessel is large. Dist LAD lesion is 20% stenosed.   Left Circumflex Vessel is small.   Right Coronary Artery Vessel is large. Prox RCA lesion is 20% stenosed.   Intervention   No interventions have been documented.       ECHOCARDIOGRAM   ECHOCARDIOGRAM COMPLETE 01/27/2023   Narrative ECHOCARDIOGRAM REPORT       Patient Name:   [name] Date of Exam: 01/27/2023 Medical Rec #:  [id number]   Height:       66.0 in Accession #:    [id number]  Weight:       114.2 lb Date of Birth:  October 31, 1945   BSA:          1.576 m Patient Age:    27 years    BP:           100/60 mmHg Patient Gender: F           HR:           65 bpm. Exam Location:  Outpatient   Procedure: 2D Echo, Cardiac Doppler and Color Doppler   Indications:    S/P Mitral Valve repair Z98.89   History:        Patient has prior history of Echocardiogram examinations, most recent 02/12/2022. Arrythmias:Atrial Fibrillation; Signs/Symptoms:Shortness of Breath.   Mitral Valve: 32 mm Sorin Memo 4D prosthetic annuloplasty ring valve is present in the mitral position. Procedure Date: 01/17/20.   Sonographer:    Ruta Cousins RDCS Referring Phys: [id number] Debria Fang     Sonographer Comments: Image acquisition challenging due to patient body habitus. IMPRESSIONS     1. Left ventricular ejection fraction, by estimation, is 55 to 60%. The left ventricle has normal function. Left ventricular endocardial border not optimally defined to evaluate regional wall motion. There is mild left ventricular hypertrophy. Left ventricular diastolic function could not be evaluated. There is the interventricular septum is flattened in systole, consistent with right ventricular pressure overload. 2. Right ventricular systolic function is moderately reduced. The right ventricular size is moderately enlarged. There is moderately elevated pulmonary  artery systolic pressure. The estimated right ventricular systolic pressure is 48.9 mmHg. 3. Right atrial size was moderately dilated. 4. The mitral valve has been repaired/replaced. Trivial mitral valve regurgitation. The mean mitral valve gradient is 2.0 mmHg with average heart rate of 63 bpm. There is a 32 mm Sorin Memo 4D prosthetic annuloplasty ring present in the mitral position. Procedure Date: 01/17/20. Echo findings are consistent with normal structure and function of the mitral valve prosthesis. 5. Tricuspid valve regurgitation is moderate. 6. The aortic valve is tricuspid. There is mild calcification of the aortic valve. There is mild thickening of the aortic valve. Aortic valve regurgitation is mild. Aortic valve sclerosis is present, with no evidence of aortic valve stenosis. 7. Pulmonic valve regurgitation is moderate. 8. The inferior vena cava is dilated in size with <50% respiratory variability, suggesting right atrial pressure of 15 mmHg.   FINDINGS Left Ventricle: Left ventricular ejection fraction, by estimation, is 55 to 60%. The left ventricle has normal function. Left ventricular endocardial border not optimally defined to evaluate regional wall motion. The left ventricular internal cavity size was normal in size. There is mild left ventricular hypertrophy. The interventricular septum is flattened in systole, consistent with right ventricular pressure overload. Left ventricular diastolic function could not be evaluated.   Right Ventricle: The right ventricular size is moderately enlarged. No increase in right ventricular wall thickness. Right ventricular systolic function is moderately reduced. There is moderately elevated pulmonary artery systolic pressure. The tricuspid regurgitant velocity is 2.91 m/s, and with an assumed right atrial pressure of 15 mmHg, the estimated right ventricular systolic pressure is 48.9 mmHg.   Left [facility]: Left atrial size was normal in size.    Right [facility]: Right atrial size was moderately dilated.   Pericardium: There is no evidence of pericardial effusion.   Mitral Valve: The mitral valve has been repaired/replaced. Trivial mitral valve regurgitation. There is a 32 mm Sorin Memo 4D prosthetic annuloplasty ring present in the  mitral position. Procedure Date: 01/17/20. Echo findings are consistent with normal structure and function of the mitral valve prosthesis. MV peak gradient, 8.8 mmHg. The mean mitral valve gradient is 2.0 mmHg with average heart rate of 63 bpm.   Tricuspid Valve: The tricuspid valve is normal in structure. Tricuspid valve regurgitation is moderate.   Aortic Valve: The aortic valve is tricuspid. There is mild calcification of the aortic valve. There is mild thickening of the aortic valve. Aortic valve regurgitation is mild. Aortic valve sclerosis is present, with no evidence of aortic valve stenosis.   Pulmonic Valve: The pulmonic valve was normal in structure. Pulmonic valve regurgitation is moderate.   Aorta: The aortic root is normal in size and structure.   Venous: The inferior vena cava is dilated in size with less than 50% respiratory variability, suggesting right atrial pressure of 15 mmHg.   IAS/Shunts: The atrial septum is grossly normal.      Assessment & Plan   1.  Chronic diastolic CHF-weight today 106.4.  Mild bilateral lower extremity nonpitting pedal/ankle edema.  BMP 11/12/2023 showed creatinine 1.03 and sodium of 145, potassium 4.2.  proBNP 296 on 11/25/2023.  Recommendations from pulmonology were to hold potassium for 3 days.  She notes that she is trying to reduce the sodium in her diet. Heart healthy low-sodium diet Elevate lower extremities when not active Daily weights, weight log Continue furosemide , metoprolol , potassium Has repeat labs today with pulmonology  Atrial flutter-heart rate today 55.  Reports compliance with apixaban .  Denies bleeding issues. Avoid triggers caffeine,  chocolate, EtOH, dehydration etc. Continue apixaban , metoprolol   Status post mitral valve repair-breathing stable.  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 Lander Pines.  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  Stable. Continue current medical therapy Follows with PCP  Disposition: Follow-up with Dr. Audery Blazing  or APP 2-3.   Chet Cota. Kamrie Fanton NP-C     12/01/2023, 12:09 PM [facility] [address] Office [phone] Fax [phone]    I spent 14 minutes examining this patient, reviewing medications, and using patient centered shared decision making involving their cardiac care.   I spent  20 minutes reviewing past medical history,  medications, and prior cardiac tests.

## 2023-12-02 ENCOUNTER — Ambulatory Visit: Admitting: Cardiology

## 2023-12-02 ENCOUNTER — Telehealth: Payer: Self-pay

## 2023-12-02 ENCOUNTER — Telehealth: Payer: Self-pay | Admitting: General Practice

## 2023-12-02 DIAGNOSIS — I4892 Unspecified atrial flutter: Secondary | ICD-10-CM

## 2023-12-02 DIAGNOSIS — Z79899 Other long term (current) drug therapy: Secondary | ICD-10-CM

## 2023-12-02 DIAGNOSIS — R7989 Other specified abnormal findings of blood chemistry: Secondary | ICD-10-CM

## 2023-12-02 DIAGNOSIS — R0602 Shortness of breath: Secondary | ICD-10-CM

## 2023-12-02 DIAGNOSIS — I5032 Chronic diastolic (congestive) heart failure: Secondary | ICD-10-CM

## 2023-12-02 DIAGNOSIS — I4819 Other persistent atrial fibrillation: Secondary | ICD-10-CM

## 2023-12-02 DIAGNOSIS — Z9889 Other specified postprocedural states: Secondary | ICD-10-CM

## 2023-12-02 DIAGNOSIS — I10 Essential (primary) hypertension: Secondary | ICD-10-CM

## 2023-12-02 DIAGNOSIS — Z8679 Personal history of other diseases of the circulatory system: Secondary | ICD-10-CM

## 2023-12-02 NOTE — Telephone Encounter (Signed)
 Spoke to patient she stated she was told by PA at Pulmonary office to stop potassium.She was calling to find out when she is suppose to restart.Advised she will need to call Regina Lam NP at Pulmonary for bmet results.

## 2023-12-02 NOTE — Telephone Encounter (Signed)
 Pt c/o medication issue:  1. Name of Medication:   potassium chloride  SA (KLOR-CON  M) 20 MEQ tablet    2. How are you currently taking this medication (dosage and times per day)? As written   3. Are you having a reaction (difficulty breathing--STAT)? No   4. What is your medication issue? Pt called in stating she was told to stop this med and asked when is she supposed to restart. Please advise.

## 2023-12-02 NOTE — Telephone Encounter (Signed)
 Copied from CRM [id number]. Topic: General - Other >> Dec 02, 2023  8:43 AM Margarette Shawl wrote: Reason for CRM:  Pt is contacting clinic concerning her potassium. She was advised her potassium was slightly elevated and to hold her potassium supplement for 3 days and had repeat labwork yesterday. She would like to know when she needs to restart the potassium.  Request call back  CB#  [phone]   Please advise Katie

## 2023-12-03 NOTE — Telephone Encounter (Signed)
 Pt calling again. Waiting for answer to question. She is really stressed out about an answer because it has to do with her heart and she is concerned the stress will get to her. Thanks.

## 2023-12-03 NOTE — Telephone Encounter (Signed)
 Potassium has normalized. She has appt with cardiology 6/2 to discuss heart problems. Thanks.

## 2023-12-03 NOTE — Telephone Encounter (Signed)
 [name] can you please advise on when pt can resume potassium and labs pt recently had re-drawn.

## 2023-12-04 MED ORDER — POTASSIUM CHLORIDE CRYS ER 20 MEQ PO TBCR: 20.0000 meq | EXTENDED_RELEASE_TABLET | ORAL | Status: DC

## 2023-12-04 NOTE — Telephone Encounter (Signed)
 Patient reports her pulmonologist told her to hold her potassium for 3 days after labs on 5/21 showed potassium level of 5.3, this was rechecked on 5/27 with a potassium of 4.3.  Patient was advised by triage nurse 2 days ago to follow-up with pulmonologist about restarting potassium. Patient states their office advised her to call us  as Katlyn West, NP is the prescriber.  Patient reports she did restart potassium 20 meq daily on 5/27 and took 5/28 and 5/29.  She will hold until she hears back from our office about continuing potassium.  Will forward to Katlyn West, NP to review and advise.

## 2023-12-04 NOTE — Telephone Encounter (Signed)
 Patient calling back with questions concerning medication. Please advise

## 2023-12-04 NOTE — Telephone Encounter (Signed)
 Called and spoke with patient, shared response from Katlyn West, NP:  Please let Ms. Nitta know to take her potassium on days she takes 60 mg of lasix , (reported as every Tuesday, Thursday and Saturday). Have her recheck a BMET in two weeks for monitoring. Thanks!  Katlyn West, NP   Patient verbalized understanding.  BMP ordered and released to [facility]. Medication list updated.

## 2023-12-04 NOTE — Addendum Note (Signed)
 Addended by: Luwana Salvo on: 12/04/2023 12:40 PM   Modules accepted: Orders

## 2023-12-04 NOTE — Telephone Encounter (Signed)
 Spoke with Korene Pert. Advised pt of Katies note and secure message "I did not order the potassium so I would recommend she reach out to Katlyn West, NP who did to see what they want her to do. they can review her labs and decide from there"  Pt verbalized understanding & had no further concerns. Nfn

## 2023-12-05 DIAGNOSIS — N301 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: Secondary | ICD-10-CM | POA: Diagnosis not present

## 2023-12-05 DIAGNOSIS — E43 Unspecified severe protein-calorie malnutrition: Secondary | ICD-10-CM | POA: Diagnosis not present

## 2023-12-05 DIAGNOSIS — G629 Polyneuropathy, unspecified: Secondary | ICD-10-CM | POA: Diagnosis not present

## 2023-12-05 DIAGNOSIS — E039 Hypothyroidism, unspecified: Secondary | ICD-10-CM | POA: Diagnosis not present

## 2023-12-05 DIAGNOSIS — M810 Age-related osteoporosis without current pathological fracture: Secondary | ICD-10-CM | POA: Diagnosis not present

## 2023-12-05 DIAGNOSIS — I499 Cardiac arrhythmia, unspecified: Secondary | ICD-10-CM | POA: Diagnosis not present

## 2023-12-05 DIAGNOSIS — I10 Essential (primary) hypertension: Secondary | ICD-10-CM | POA: Diagnosis not present

## 2023-12-05 DIAGNOSIS — R911 Solitary pulmonary nodule: Secondary | ICD-10-CM | POA: Diagnosis not present

## 2023-12-06 ENCOUNTER — Other Ambulatory Visit: Payer: Self-pay

## 2023-12-06 ENCOUNTER — Emergency Department (HOSPITAL_BASED_OUTPATIENT_CLINIC_OR_DEPARTMENT_OTHER)

## 2023-12-06 ENCOUNTER — Encounter (HOSPITAL_BASED_OUTPATIENT_CLINIC_OR_DEPARTMENT_OTHER): Payer: Self-pay | Admitting: Emergency Medicine

## 2023-12-06 ENCOUNTER — Emergency Department (HOSPITAL_BASED_OUTPATIENT_CLINIC_OR_DEPARTMENT_OTHER)
Admission: EM | Admit: 2023-12-06 | Discharge: 2023-12-06 | Disposition: A | Discharge status: Home / Self Care | Attending: Emergency Medicine | Admitting: Emergency Medicine

## 2023-12-06 DIAGNOSIS — R0602 Shortness of breath: Secondary | ICD-10-CM | POA: Diagnosis not present

## 2023-12-06 DIAGNOSIS — Z91040 Latex allergy status: Secondary | ICD-10-CM | POA: Diagnosis not present

## 2023-12-06 DIAGNOSIS — R918 Other nonspecific abnormal finding of lung field: Secondary | ICD-10-CM | POA: Diagnosis not present

## 2023-12-06 DIAGNOSIS — Z7901 Long term (current) use of anticoagulants: Secondary | ICD-10-CM | POA: Diagnosis not present

## 2023-12-06 DIAGNOSIS — R7989 Other specified abnormal findings of blood chemistry: Secondary | ICD-10-CM | POA: Diagnosis not present

## 2023-12-06 LAB — CBC WITH DIFFERENTIAL/PLATELET
Abs Immature Granulocytes: 0.02 10*3/uL (ref 0.00–0.07)
Basophils Absolute: 0 10*3/uL (ref 0.0–0.1)
Basophils Relative: 0 %
Eosinophils Absolute: 0.2 10*3/uL (ref 0.0–0.5)
Eosinophils Relative: 3 %
HCT: 36 % (ref 36.0–46.0)
Hemoglobin: 11.4 g/dL — ABNORMAL LOW (ref 12.0–15.0)
Immature Granulocytes: 0 %
Lymphocytes Relative: 11 %
Lymphs Abs: 0.6 10*3/uL — ABNORMAL LOW (ref 0.7–4.0)
MCH: 30.3 pg (ref 26.0–34.0)
MCHC: 31.7 g/dL (ref 30.0–36.0)
MCV: 95.7 fL (ref 80.0–100.0)
Monocytes Absolute: 0.5 10*3/uL (ref 0.1–1.0)
Monocytes Relative: 8 %
Neutro Abs: 4.3 10*3/uL (ref 1.7–7.7)
Neutrophils Relative %: 78 %
Platelets: 140 10*3/uL — ABNORMAL LOW (ref 150–400)
RBC: 3.76 MIL/uL — ABNORMAL LOW (ref 3.87–5.11)
RDW: 14.2 % (ref 11.5–15.5)
WBC: 5.6 10*3/uL (ref 4.0–10.5)
nRBC: 0 % (ref 0.0–0.2)

## 2023-12-06 LAB — COMPREHENSIVE METABOLIC PANEL WITH GFR
ALT: 21 U/L (ref 0–44)
AST: 31 U/L (ref 15–41)
Albumin: 3.9 g/dL (ref 3.5–5.0)
Alkaline Phosphatase: 82 U/L (ref 38–126)
Anion gap: 9 (ref 5–15)
BUN: 28 mg/dL — ABNORMAL HIGH (ref 8–23)
CO2: 33 mmol/L — ABNORMAL HIGH (ref 22–32)
Calcium: 9.5 mg/dL (ref 8.9–10.3)
Chloride: 103 mmol/L (ref 98–111)
Creatinine, Ser: 0.99 mg/dL (ref 0.44–1.00)
GFR, Estimated: 58 mL/min — ABNORMAL LOW (ref >60)
Glucose, Bld: 96 mg/dL (ref 70–99)
Potassium: 4.2 mmol/L (ref 3.5–5.1)
Sodium: 145 mmol/L (ref 135–145)
Total Bilirubin: 0.6 mg/dL (ref 0.0–1.2)
Total Protein: 6.5 g/dL (ref 6.5–8.1)

## 2023-12-06 LAB — PRO BRAIN NATRIURETIC PEPTIDE: Pro Brain Natriuretic Peptide: 1098 pg/mL — ABNORMAL HIGH (ref <300.0)

## 2023-12-06 LAB — TROPONIN T, HIGH SENSITIVITY: Troponin T High Sensitivity: 17 ng/L (ref <19)

## 2023-12-06 MED ORDER — FUROSEMIDE 10 MG/ML IJ SOLN
40.0000 mg | Freq: Once | INTRAMUSCULAR | Status: AC
  Administered 2023-12-06: 40 mg via INTRAVENOUS
  Filled 2023-12-06: qty 4

## 2023-12-06 NOTE — Discharge Instructions (Signed)
 Follow-up with your cardiologist as scheduled tomorrow.  Please call your family doctor tomorrow and let them know about your visit here today.  Please return for worsening difficulty breathing confusion fever.

## 2023-12-06 NOTE — ED Triage Notes (Signed)
 Pt reports increased SHOB over the last week; hx of CHF; NAD in triage, RT in to assess

## 2023-12-06 NOTE — ED Notes (Signed)
 ED Provider at bedside.

## 2023-12-06 NOTE — ED Provider Notes (Signed)
 [facility] Provider Note   CSN: [id number] Arrival date & time: 12/06/23  1137     History  Chief Complaint  Patient presents with   Shortness of Breath    [name] is a 78 y.o. female.  78 yo F with a chief complaint of difficulty breathing.  This been going on for about a week.  No cough no congestion does feel a tightness in her chest at times.  She has recently seen her cardiologist and pulmonologist and has a follow-up appointment with her cardiologist tomorrow.  She was concerned about her symptoms and decided to come into the ED for evaluation.   Shortness of Breath      Home Medications Prior to Admission medications   Medication Sig Start Date End Date Taking? Authorizing Provider  acetaminophen  (TYLENOL ) 500 MG tablet Take 500 mg by mouth every 6 (six) hours as needed (pain.).    [provider]  amiodarone  (PACERONE ) 200 MG tablet Take 200 mg by mouth daily.    [provider]  apixaban  (ELIQUIS ) 5 MG TABS tablet Take 1 tablet (5 mg total) by mouth 2 (two) times daily. 11/16/23   West, Katlyn D, NP  calcium  carbonate (OS-CAL) 1250 (500 Ca) MG chewable tablet Chew 1 tablet by mouth every evening.    [provider]  Calcium  Carbonate Antacid (TUMS CHEWY BITES PO) Take 1 tablet by mouth daily as needed (reflux).     [provider]  Carboxymethylcellul-Glycerin  (LUBRICATING EYE DROPS OP) Place 1 drop into both eyes 3 (three) times daily as needed (dry/irritated eys.).    [provider]  cephALEXin  (KEFLEX ) 500 MG capsule Take 2 grams (4 tablets) by mouth 1 hour prior to dental cleaning/procedure. 03/27/22   Sonny Dust, MD  Cholecalciferol (VITAMIN D -3 PO) Take 1 tablet by mouth every evening.    [provider]  clonazePAM  (KLONOPIN ) 0.5 MG tablet Take 1 tablet by mouth twice daily as needed for anxiety Patient taking differently: Take 0.125-0.25 mg by mouth 2  (two) times daily as needed for anxiety. 01/26/23   Tabori, Katherine E, MD  cyanocobalamin  (VITAMIN B12) 1000 MCG/ML injection Inject 1,000 mcg into the muscle every 30 (thirty) days. 07/09/23   [provider]  denosumab  (PROLIA ) 60 MG/ML SOSY injection Inject 60 mg into the skin every 6 (six) months.    [provider]  furosemide  (LASIX ) 20 MG tablet Take 20 mg by mouth every Tuesday, Thursday, and Saturday at 6 PM. Per patient taking 60 mg on these days    [provider]  furosemide  (LASIX ) 40 MG tablet Take 1 tablet (40 mg total) by mouth daily. 09/23/23 12/22/23  Lenise Quince, MD  levalbuterol  (XOPENEX  HFA) 45 MCG/ACT inhaler Inhale 2 puffs into the lungs every 4 (four) hours as needed for wheezing. 10/26/23 10/25/24  Wilfredo Hanly, MD  levothyroxine  (SYNTHROID ) 75 MCG tablet Take 1 tablet by mouth once daily 10/26/23   Tabori, Katherine E, MD  loratadine (CLARITIN) 10 MG tablet Take 10 mg by mouth in the morning.    [provider]  meclizine  (ANTIVERT ) 25 MG tablet TAKE 1 TABLET BY MOUTH THREE TIMES DAILY AS NEEDED FOR DIZZINESS 08/24/23   Tabori, Katherine E, MD  metoprolol  tartrate (LOPRESSOR ) 25 MG tablet Take 1 tablet by mouth twice daily 10/07/23   Lenise Quince, MD  mupirocin ointment (BACTROBAN) 2 % Apply 1 Application topically daily. 08/14/22   [provider]  nystatin  (MYCOSTATIN /NYSTOP ) powder Apply topically 2 (two) times daily as needed (skin irritation (under breasts)). 02/08/20   Love, Renay Carota, PA-C  ondansetron  (ZOFRAN ) 8 MG tablet Take 1 tablet (8 mg total) by mouth every 8 (eight) hours as needed for nausea or vomiting. 07/24/23   Tabori, Katherine E, MD  oxyCODONE  (ROXICODONE ) 5 MG/5ML solution Take 4 mLs (4 mg total) by mouth every 4 (four) hours as needed for moderate pain. Patient taking differently: Take 5 mg by mouth every 4 (four) hours as needed (pain.). 02/08/20   Love, Renay Carota, PA-C  pantoprazole  (PROTONIX ) 40 MG tablet  Take 1 tablet by mouth once daily 05/29/23   Dewald, Jonathan B, MD  polyethylene glycol (MIRALAX  / GLYCOLAX ) packet Take 17 g by mouth daily as needed for mild constipation.    [provider]  potassium chloride  SA (KLOR-CON  M) 20 MEQ tablet Take 1 tablet (20 mEq total) by mouth 3 (three) times a week. Take on Tuesdays, Thursdays and Saturdays when you take Lasix  60 mg. 12/04/23   West, Katlyn D, NP  revefenacin  (YUPELRI ) 175 MCG/3ML nebulizer solution Inhale one vial in nebulizer once daily. Do not mix with other nebulized medications. 11/18/23   Wilfredo Hanly, MD  sodium chloride  (OCEAN) 0.65 % SOLN nasal spray Place 1 spray into both nostrils as needed for congestion (nose bleeds).    [provider]  Ubrogepant (UBRELVY) 100 MG TABS Take 25 mg by mouth daily as needed (headaches/migraines).    [provider]  valACYclovir  (VALTREX ) 500 MG tablet Take 500 mg by mouth daily as needed (breakouts). 07/15/19   [provider]      Allergies    Buprenorphine, Erythromycin, Iodinated contrast media, Latex, Other, Tape, Ciprofloxacin , Duloxetine  hcl, Gabapentin , Hydrocodone, Hydromorphone , Iodine, Levofloxacin, Metrizamide, Nsaids, Nucynta [tapentadol hcl], Oxycodone , Pentazocine, Pregabalin, Sulfasalazine, Morphine, Sulfa antibiotics, and Sulfamethoxazole-trimethoprim    Review of Systems   Review of Systems  Respiratory:  Positive for shortness of breath.     Physical Exam Updated Vital Signs BP 106/68   Pulse (!) 57   Temp 98.1 F (36.7 C) (Oral)   Resp (!) 24   Ht 5\' 6"  (1.676 m)   Wt 48.3 kg   SpO2 95%   BMI 17.17 kg/m  Physical Exam Vitals and nursing note reviewed.  Constitutional:      General: She is not in acute distress.    Appearance: She is not diaphoretic.     Comments: Cachectic  HENT:     Head: Normocephalic and atraumatic.  Eyes:     Pupils: Pupils are equal, round, and reactive to light.  Cardiovascular:     Rate and  Rhythm: Normal rate and regular rhythm.     Heart sounds: No murmur heard.    No friction rub. No gallop.  Pulmonary:     Effort: Pulmonary effort is normal.     Breath sounds: No wheezing or rales.  Abdominal:     General: There is no distension.     Palpations: Abdomen is soft.     Tenderness: There is no abdominal tenderness.  Musculoskeletal:        General: No tenderness.     Cervical back: Normal range of motion and neck supple.  Skin:    General: Skin is warm and dry.  Neurological:     Mental Status: She is alert and oriented to person, place, and time.  Psychiatric:        Behavior: Behavior  normal.     ED Results / Procedures / Treatments   Labs (all labs ordered are listed, but only abnormal results are displayed) Labs Reviewed  CBC WITH DIFFERENTIAL/PLATELET - Abnormal; Notable for the following components:      Result Value   RBC 3.76 (*)    Hemoglobin 11.4 (*)    Platelets 140 (*)    Lymphs Abs 0.6 (*)    All other components within normal limits  COMPREHENSIVE METABOLIC PANEL WITH GFR - Abnormal; Notable for the following components:   CO2 33 (*)    BUN 28 (*)    GFR, Estimated 58 (*)    All other components within normal limits  PRO BRAIN NATRIURETIC PEPTIDE - Abnormal; Notable for the following components:   Pro Brain Natriuretic Peptide 1,098.0 (*)    All other components within normal limits  TROPONIN T, HIGH SENSITIVITY    EKG None  Radiology DG Chest Port 1 View Result Date: 12/06/2023 CLINICAL DATA:  sob EXAM: PORTABLE CHEST 1 VIEW COMPARISON:  Chest x-ray 11/25/2023, CT chest 02/13/2022 FINDINGS: The heart and mediastinal contours are unchanged. Mitral valve replacement. Atrial appendage clip. Hyperinflation of the lungs. No focal consolidation. Chronic coarsened interstitial markings. Blunting of bilateral costophrenic angles with possible trace pleural effusions. No pneumothorax. No acute osseous abnormality.  Old right rib fractures.  IMPRESSION: 1. Blunting of bilateral costophrenic angles with possible trace pleural effusions. 2. Chronic coarsened interstitial markings. Electronically Signed   By: Morgane  Naveau M.D.   On: 12/06/2023 12:39    Procedures Procedures    Medications Ordered in ED Medications  furosemide  (LASIX ) injection 40 mg (has no administration in time range)    ED Course/ Medical Decision Making/ A&P                                 Medical Decision Making Amount and/or Complexity of Data Reviewed Labs: ordered. Radiology: ordered.  Risk Prescription drug management.   78 yo F with a chief complaint of difficulty breathing.  She tells me this been going on for a week.  Has some chest discomfort with it as well.  Will obtain a troponin.  Chest x-ray.  Blood work.  My exam with clear lungs.  No obvious tachypnea.  Chest x-ray on my independent interpretation without obvious focal infiltrate or pneumothorax.  Troponin negative.  No significant change to potassium or creatinine.  Patient has a chronic metabolic alkalosis.  Her hemoglobin is slightly lower than it had been previous I am not sure this will be enough to make her have symptoms.  Her BNP is elevated though seems similar to what she has had previously.  She does have some lower extremity edema at the ankles it does not sound like that is typical for her.  Will give a bolus dose of IV Lasix  here.  She has scheduled follow-up with her cardiologist tomorrow.  1:04 PM:  I have discussed the diagnosis/risks/treatment options with the patient.  Evaluation and diagnostic testing in the emergency department does not suggest an emergent condition requiring admission or immediate intervention beyond what has been performed at this time.  They will follow up with PCP, Cards. We also discussed returning to the ED immediately if new or worsening sx occur. We discussed the sx which are most concerning (e.g., sudden worsening pain, fever,  inability to tolerate by mouth) that necessitate immediate return. Medications administered to the patient during  their visit and any new prescriptions provided to the patient are listed below.  Medications given during this visit Medications  furosemide  (LASIX ) injection 40 mg (has no administration in time range)     The patient appears reasonably screen and/or stabilized for discharge and I doubt any other medical condition or other [facility] requiring further screening, evaluation, or treatment in the ED at this time prior to discharge.          Final Clinical Impression(s) / ED Diagnoses Final diagnoses:  None    Rx / DC Orders ED Discharge Orders     None         Albertus Hughs, DO 12/06/23 1304

## 2023-12-07 ENCOUNTER — Encounter (HOSPITAL_COMMUNITY): Payer: Self-pay | Admitting: Internal Medicine

## 2023-12-07 ENCOUNTER — Ambulatory Visit (HOSPITAL_COMMUNITY)
Admission: RE | Admit: 2023-12-07 | Discharge: 2023-12-07 | Disposition: A | Discharge status: Home / Self Care | Source: Ambulatory Visit | Attending: Internal Medicine | Admitting: Internal Medicine

## 2023-12-07 VITALS — BP 90/50 | HR 59 | Ht 66.0 in | Wt 105.0 lb

## 2023-12-07 DIAGNOSIS — I4819 Other persistent atrial fibrillation: Secondary | ICD-10-CM | POA: Diagnosis not present

## 2023-12-07 DIAGNOSIS — D6869 Other thrombophilia: Secondary | ICD-10-CM | POA: Diagnosis not present

## 2023-12-07 DIAGNOSIS — Z79899 Other long term (current) drug therapy: Secondary | ICD-10-CM | POA: Diagnosis not present

## 2023-12-07 DIAGNOSIS — Z5181 Encounter for therapeutic drug level monitoring: Secondary | ICD-10-CM | POA: Insufficient documentation

## 2023-12-07 NOTE — Progress Notes (Signed)
 Primary Care Physician: Jess Morita, MD Primary Cardiologist: Alexandria Angel, MD Electrophysiologist: Will Cortland Ding, MD     Referring Physician: Dr. Donne Gage [name] is a 78 y.o. female with a history of chronic diastolic CHF, s/p MVR, HLD, HTN, chronic dyspnea, and atrial fibrillation who presents for consultation in the [facility]. Patient contacted clinic on 5/19 noting SOB and pain in chest since DCCV on 10/27/23. Review of notes show chronic history of dyspnea noted by Dr. Audery Blazing in OV 09/23/23 on diuretic. She is taking amiodarone  200 mg daily. Patient is on Eliquis  5 mg BID for a CHADS2VASC score of 5.  On evaluation today, she is currently in NSR. She went to ED yesterday for SOB and received IV lasix . She is taking amiodarone  200 mg daily. Weight 106.4 lbs at cardiology visit on 5/27. She notes ongoing chronic history of dyspnea.   Today, she denies symptoms of palpitations, chest pain, shortness of breath, orthopnea, PND, lower extremity edema, dizziness, presyncope, syncope, snoring, daytime somnolence, bleeding, or neurologic sequela. The patient is tolerating medications without difficulties and is otherwise without complaint today.   she has a BMI of Body mass index is 16.95 kg/m.Aaron Aas Filed Weights   12/07/23 1420  Weight: 47.6 kg    Current Outpatient Medications  Medication Sig Dispense Refill   acetaminophen  (TYLENOL ) 500 MG tablet Take 500 mg by mouth every 6 (six) hours as needed (pain.).     amiodarone  (PACERONE ) 200 MG tablet Take 200 mg by mouth daily.     apixaban  (ELIQUIS ) 5 MG TABS tablet Take 1 tablet (5 mg total) by mouth 2 (two) times daily. 180 tablet 3   calcium  carbonate (OS-CAL) 1250 (500 Ca) MG chewable tablet Chew 1 tablet by mouth every evening.     Calcium  Carbonate Antacid (TUMS CHEWY BITES PO) Take 1 tablet by mouth daily as needed (reflux).      Carboxymethylcellul-Glycerin  (LUBRICATING EYE DROPS  OP) Place 1 drop into both eyes 3 (three) times daily as needed (dry/irritated eys.).     cephALEXin  (KEFLEX ) 500 MG capsule Take 2 grams (4 tablets) by mouth 1 hour prior to dental cleaning/procedure. 4 capsule 2   Cholecalciferol (VITAMIN D -3 PO) Take 1 tablet by mouth every evening.     clonazePAM  (KLONOPIN ) 0.5 MG tablet Take 1 tablet by mouth twice daily as needed for anxiety (Patient taking differently: Take 0.125-0.25 mg by mouth 2 (two) times daily as needed for anxiety.) 30 tablet 3   cyanocobalamin  (VITAMIN B12) 1000 MCG/ML injection Inject 1,000 mcg into the muscle every 30 (thirty) days.     denosumab  (PROLIA ) 60 MG/ML SOSY injection Inject 60 mg into the skin every 6 (six) months.     furosemide  (LASIX ) 20 MG tablet Take 20 mg by mouth every Tuesday, Thursday, and Saturday at 6 PM. Per patient taking 60 mg on these days     furosemide  (LASIX ) 40 MG tablet Take 1 tablet (40 mg total) by mouth daily.     levalbuterol  (XOPENEX  HFA) 45 MCG/ACT inhaler Inhale 2 puffs into the lungs every 4 (four) hours as needed for wheezing. 1 each 6   levothyroxine  (SYNTHROID ) 75 MCG tablet Take 1 tablet by mouth once daily 90 tablet 0   loratadine (CLARITIN) 10 MG tablet Take 10 mg by mouth in the morning.     meclizine  (ANTIVERT ) 25 MG tablet TAKE 1 TABLET BY MOUTH THREE TIMES DAILY AS NEEDED FOR DIZZINESS  45 tablet 0   metoprolol  tartrate (LOPRESSOR ) 25 MG tablet Take 1 tablet by mouth twice daily 180 tablet 3   mupirocin ointment (BACTROBAN) 2 % Apply 1 Application topically daily.     nystatin  (MYCOSTATIN /NYSTOP ) powder Apply topically 2 (two) times daily as needed (skin irritation (under breasts)). 15 g 0   ondansetron  (ZOFRAN ) 8 MG tablet Take 1 tablet (8 mg total) by mouth every 8 (eight) hours as needed for nausea or vomiting. 45 tablet 1   oxyCODONE  (ROXICODONE ) 5 MG/5ML solution Take 4 mLs (4 mg total) by mouth every 4 (four) hours as needed for moderate pain. (Patient taking differently: Take 5  mg by mouth every 4 (four) hours as needed (pain.).)  0   pantoprazole  (PROTONIX ) 40 MG tablet Take 1 tablet by mouth once daily 30 tablet 3   polyethylene glycol (MIRALAX  / GLYCOLAX ) packet Take 17 g by mouth daily as needed for mild constipation.     potassium chloride  SA (KLOR-CON  M) 20 MEQ tablet Take 1 tablet (20 mEq total) by mouth 3 (three) times a week. Take on Tuesdays, Thursdays and Saturdays when you take Lasix  60 mg.     revefenacin  (YUPELRI ) 175 MCG/3ML nebulizer solution Inhale one vial in nebulizer once daily. Do not mix with other nebulized medications. 3 mL 11   sodium chloride  (OCEAN) 0.65 % SOLN nasal spray Place 1 spray into both nostrils as needed for congestion (nose bleeds).     Ubrogepant (UBRELVY) 100 MG TABS Take 25 mg by mouth daily as needed (headaches/migraines).     valACYclovir  (VALTREX ) 500 MG tablet Take 500 mg by mouth daily as needed (breakouts).     No current facility-administered medications for this encounter.    Atrial Fibrillation Management history:  Previous antiarrhythmic drugs: amiodarone  Previous cardioversions: 10/27/23 Previous ablations: none Anticoagulation history: Eliquis    ROS- All systems are reviewed and negative except as per the HPI above.  Physical Exam: BP (!) 90/50   Pulse (!) 59   Ht 5\' 6"  (1.676 m)   Wt 47.6 kg   BMI 16.95 kg/m   GEN: Well nourished, well developed in no acute distress NECK: No JVD; No carotid bruits CARDIAC: Regular rate and rhythm, no murmurs, rubs, gallops RESPIRATORY:  Clear to auscultation without rales, wheezing or rhonchi  ABDOMEN: Soft, non-tender, non-distended EXTREMITIES:  No edema; No deformity   EKG today demonstrates  Vent. rate 59 BPM PR interval 190 ms QRS duration 76 ms QT/QTcB 334/330 ms P-R-T axes * 98 34 Sinus bradycardia with Premature supraventricular complexes Rightward axis ST & T wave abnormality, consider inferolateral ischemia Abnormal ECG When compared with ECG of  06-Dec-2023 11:49, PREVIOUS ECG IS PRESENT  Echo 01/27/23 demonstrated   1. Left ventricular ejection fraction, by estimation, is 55 to 60%. The  left ventricle has normal function. Left ventricular endocardial border  not optimally defined to evaluate regional wall motion. There is mild left  ventricular hypertrophy. Left  ventricular diastolic function could not be evaluated. There is the  interventricular septum is flattened in systole, consistent with right  ventricular pressure overload.   2. Right ventricular systolic function is moderately reduced. The right  ventricular size is moderately enlarged. There is moderately elevated  pulmonary artery systolic pressure. The estimated right ventricular  systolic pressure is 48.9 mmHg.   3. Right atrial size was moderately dilated.   4. The mitral valve has been repaired/replaced. Trivial mitral valve  regurgitation. The mean mitral valve gradient is 2.0 mmHg with  average  heart rate of 63 bpm. There is a 32 mm Sorin Memo 4D prosthetic  annuloplasty ring present in the mitral position.   Procedure Date: 01/17/20. Echo findings are consistent with normal  structure and function of the mitral valve prosthesis.   5. Tricuspid valve regurgitation is moderate.   6. The aortic valve is tricuspid. There is mild calcification of the  aortic valve. There is mild thickening of the aortic valve. Aortic valve  regurgitation is mild. Aortic valve sclerosis is present, with no evidence  of aortic valve stenosis.   7. Pulmonic valve regurgitation is moderate.   8. The inferior vena cava is dilated in size with <50% respiratory  variability, suggesting right atrial pressure of 15 mmHg.   ASSESSMENT & PLAN CHA2DS2-VASc Score = 5  The patient's score is based upon: CHF History: 1 HTN History: 1 Diabetes History: 0 Stroke History: 0 Vascular Disease History: 0 Age Score: 2 Gender Score: 1       ASSESSMENT AND PLAN: Paroxysmal Atrial  Fibrillation (ICD10:  I48.0) The patient's CHA2DS2-VASc score is 5, indicating a 7.2% annual risk of stroke.    She is currently in NSR. Patient notes she is very anxious about long history of dyspnea. She tells me she would like to be retested to determine if meets requirement for oxygen. I recommended she follow up with her pulmonology office to help with this.  High risk medication monitoring (ICD10: J342684) Patient requires ongoing monitoring for anti-arrhythmic medication which has the potential to cause life threatening arrhythmias or AV block. Qtc stable. Continue amiodarone  200 mg daily. Cmet on 6/1 and Tsh on 5/21 both stable.  Secondary Hypercoagulable State (ICD10:  D68.69) The patient is at significant risk for stroke/thromboembolism based upon her CHA2DS2-VASc Score of 5.  Continue Apixaban  (Eliquis ).  No missed doses.      Follow up 6 months for amiodarone  surveillance.    Minnie Amber, PA-C  [facility] [address] [place], [place] [address] [phone]

## 2023-12-10 DIAGNOSIS — M810 Age-related osteoporosis without current pathological fracture: Secondary | ICD-10-CM | POA: Diagnosis not present

## 2023-12-14 ENCOUNTER — Telehealth: Payer: Self-pay | Admitting: Cardiology

## 2023-12-14 NOTE — Telephone Encounter (Signed)
 Pt c/o medication issue:  1. Name of Medication: Potassium  2. How are you currently taking this medication (dosage and times per day)?   3. Are you having a reaction (difficulty breathing--STAT)?   4. What is your medication issue? She said she messed up taking her Potassium

## 2023-12-14 NOTE — Telephone Encounter (Signed)
 Patient states she mistakenly took an extra dose of potassium yesterday and would like to know what to do.  Advised patient to resume regular dosing scheduling, taking potassium on days she takes higher dose of Lasix  (Tuesday/Thursday/Saturday) and have repeat BMET drawn on 6/12 or 6/13 this week as previously discussed 2 weeks ago.  Patient verbalized understanding.

## 2023-12-17 DIAGNOSIS — G5603 Carpal tunnel syndrome, bilateral upper limbs: Secondary | ICD-10-CM | POA: Diagnosis not present

## 2023-12-17 DIAGNOSIS — M7912 Myalgia of auxiliary muscles, head and neck: Secondary | ICD-10-CM | POA: Diagnosis not present

## 2023-12-17 DIAGNOSIS — Z79899 Other long term (current) drug therapy: Secondary | ICD-10-CM | POA: Diagnosis not present

## 2023-12-17 DIAGNOSIS — G894 Chronic pain syndrome: Secondary | ICD-10-CM | POA: Diagnosis not present

## 2023-12-17 DIAGNOSIS — G43711 Chronic migraine without aura, intractable, with status migrainosus: Secondary | ICD-10-CM | POA: Diagnosis not present

## 2023-12-17 DIAGNOSIS — M7918 Myalgia, other site: Secondary | ICD-10-CM | POA: Diagnosis not present

## 2023-12-17 DIAGNOSIS — G603 Idiopathic progressive neuropathy: Secondary | ICD-10-CM | POA: Diagnosis not present

## 2023-12-18 ENCOUNTER — Ambulatory Visit: Payer: Self-pay | Admitting: Cardiology

## 2023-12-18 DIAGNOSIS — R040 Epistaxis: Secondary | ICD-10-CM | POA: Diagnosis not present

## 2023-12-18 DIAGNOSIS — H6123 Impacted cerumen, bilateral: Secondary | ICD-10-CM | POA: Diagnosis not present

## 2023-12-20 ENCOUNTER — Other Ambulatory Visit: Payer: Self-pay | Admitting: Pulmonary Disease

## 2023-12-20 DIAGNOSIS — M810 Age-related osteoporosis without current pathological fracture: Secondary | ICD-10-CM | POA: Diagnosis not present

## 2023-12-20 DIAGNOSIS — R911 Solitary pulmonary nodule: Secondary | ICD-10-CM | POA: Diagnosis not present

## 2023-12-20 DIAGNOSIS — I499 Cardiac arrhythmia, unspecified: Secondary | ICD-10-CM | POA: Diagnosis not present

## 2023-12-20 DIAGNOSIS — K219 Gastro-esophageal reflux disease without esophagitis: Secondary | ICD-10-CM | POA: Diagnosis not present

## 2023-12-20 DIAGNOSIS — E43 Unspecified severe protein-calorie malnutrition: Secondary | ICD-10-CM | POA: Diagnosis not present

## 2023-12-20 DIAGNOSIS — N301 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: Secondary | ICD-10-CM | POA: Diagnosis not present

## 2023-12-20 DIAGNOSIS — I10 Essential (primary) hypertension: Secondary | ICD-10-CM | POA: Diagnosis not present

## 2023-12-20 DIAGNOSIS — E039 Hypothyroidism, unspecified: Secondary | ICD-10-CM | POA: Diagnosis not present

## 2023-12-20 DIAGNOSIS — G629 Polyneuropathy, unspecified: Secondary | ICD-10-CM | POA: Diagnosis not present

## 2023-12-22 NOTE — Telephone Encounter (Signed)
-----   Message from Katlyn D West sent at 12/18/2023  8:24 AM EDT ----- Please let Ms. Garry know that her kidney function is normal and her potassium is normal. Good results, continue current medications and follow up as planned.  ----- Message ----- From: Garner Jury Lab Results In Sent: 12/18/2023   1:35 AM EDT To: Katlyn D West, NP

## 2023-12-22 NOTE — Telephone Encounter (Signed)
 Called patient advised of below they verbalized understanding Advised of upcoming appointment

## 2023-12-24 NOTE — Progress Notes (Unsigned)
 HPI: Follow-up chronic dyspnea, previous mitral valve repair, paroxysmal atrial fibrillation.  Patient underwent minimally invasive mitral valve repair, complete maze procedure, clipping of left atrial appendage and sternotomy with repair of left ventricular free wall laceration July 2021.  Note preoperative catheterization May 2021 showed 20% right coronary artery and 20% distal LAD.  She has had difficulties with continuing dyspnea and chest pain. Chest CT August 2023 showed right middle lobe and left upper lobe nodularity suggestive of atypical infection, nodule in the left major fissure.  ABIs May 2024 normal.  Echocardiogram July 2024 showed normal LV function, moderate RV dysfunction, moderate right ventricular enlargement, moderate pulm hypertension, moderate right atrial enlargement, status post mitral valve repair with mean gradient 2 mmHg and trace mitral regurgitation, moderate tricuspid regurgitation, mild aortic insufficiency and moderate pulmonic insufficiency.  She has also been diagnosed with obstructive sleep apnea and is now on CPAP.  Also with recurrent nosebleeds.  Had cardioversion 10/27/23.  Since last seen she continues to have episodes of dizziness that can last all day.  Improves with lying down but occurs with sitting and standing.  No syncope.  She continues to have dyspnea.  She has occasional pain in her scapula bilaterally radiating to her chest.  Current Outpatient Medications  Medication Sig Dispense Refill   acetaminophen  (TYLENOL ) 500 MG tablet Take 500 mg by mouth every 6 (six) hours as needed (pain.).     amiodarone  (PACERONE ) 200 MG tablet Take 200 mg by mouth daily.     apixaban  (ELIQUIS ) 5 MG TABS tablet Take 1 tablet (5 mg total) by mouth 2 (two) times daily. 180 tablet 3   calcium  carbonate (OS-CAL) 1250 (500 Ca) MG chewable tablet Chew 1 tablet by mouth every evening.     Calcium  Carbonate Antacid (TUMS CHEWY BITES PO) Take 1 tablet by mouth daily as needed  (reflux).      Carboxymethylcellul-Glycerin  (LUBRICATING EYE DROPS OP) Place 1 drop into both eyes 3 (three) times daily as needed (dry/irritated eys.).     cephALEXin  (KEFLEX ) 500 MG capsule Take 2 grams (4 tablets) by mouth 1 hour prior to dental cleaning/procedure. 4 capsule 2   Cholecalciferol (VITAMIN D -3 PO) Take 1 tablet by mouth every evening.     clonazePAM  (KLONOPIN ) 0.5 MG tablet Take 1 tablet by mouth twice daily as needed for anxiety (Patient taking differently: Take 0.125-0.25 mg by mouth 2 (two) times daily as needed for anxiety.) 30 tablet 3   cyanocobalamin  (VITAMIN B12) 1000 MCG/ML injection Inject 1,000 mcg into the muscle every 30 (thirty) days.     denosumab  (PROLIA ) 60 MG/ML SOSY injection Inject 60 mg into the skin every 6 (six) months.     furosemide  (LASIX ) 20 MG tablet Take 20 mg by mouth every Tuesday, Thursday, and Saturday at 6 PM. Per patient taking 60 mg on these days     furosemide  (LASIX ) 40 MG tablet Take 1 tablet (40 mg total) by mouth daily.     levalbuterol  (XOPENEX  HFA) 45 MCG/ACT inhaler Inhale 2 puffs into the lungs every 4 (four) hours as needed for wheezing. 1 each 6   levothyroxine  (SYNTHROID ) 75 MCG tablet Take 1 tablet by mouth once daily 90 tablet 0   loratadine (CLARITIN) 10 MG tablet Take 10 mg by mouth in the morning.     meclizine  (ANTIVERT ) 25 MG tablet TAKE 1 TABLET BY MOUTH THREE TIMES DAILY AS NEEDED FOR DIZZINESS 45 tablet 0   metoprolol  tartrate (LOPRESSOR ) 25 MG tablet  Take 1 tablet by mouth twice daily 180 tablet 3   mupirocin ointment (BACTROBAN) 2 % Apply 1 Application topically daily.     nystatin  (MYCOSTATIN /NYSTOP ) powder Apply topically 2 (two) times daily as needed (skin irritation (under breasts)). 15 g 0   ondansetron  (ZOFRAN ) 8 MG tablet Take 1 tablet (8 mg total) by mouth every 8 (eight) hours as needed for nausea or vomiting. 45 tablet 1   oxyCODONE  (ROXICODONE ) 5 MG/5ML solution Take 4 mLs (4 mg total) by mouth every 4 (four)  hours as needed for moderate pain. (Patient taking differently: Take 5 mg by mouth every 4 (four) hours as needed (pain.).)  0   pantoprazole  (PROTONIX ) 40 MG tablet Take 1 tablet by mouth once daily 30 tablet 3   polyethylene glycol (MIRALAX  / GLYCOLAX ) packet Take 17 g by mouth daily as needed for mild constipation.     potassium chloride  SA (KLOR-CON  M) 20 MEQ tablet Take 1 tablet (20 mEq total) by mouth 3 (three) times a week. Take on Tuesdays, Thursdays and Saturdays when you take Lasix  60 mg.     revefenacin  (YUPELRI ) 175 MCG/3ML nebulizer solution Inhale one vial in nebulizer once daily. Do not mix with other nebulized medications. 3 mL 11   sodium chloride  (OCEAN) 0.65 % SOLN nasal spray Place 1 spray into both nostrils as needed for congestion (nose bleeds).     Ubrogepant (UBRELVY) 100 MG TABS Take 25 mg by mouth daily as needed (headaches/migraines).     valACYclovir  (VALTREX ) 500 MG tablet Take 500 mg by mouth daily as needed (breakouts).     No current facility-administered medications for this visit.     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   Mitral regurgitation    Osteopenia 02/18/2012   Osteoporosis    PAF (paroxysmal atrial fibrillation) (HCC) 2012   Peripheral neuropathy 11/06/2011   PMDD (premenstrual dysphoric disorder)    PONV (postoperative nausea and vomiting)    nausea, vomiting, hives and dizziness    S/P Maze operation for atrial fibrillation  01/17/2020   Complete bilateral atrial lesion set using cryothermy and bipolar radiofrequency ablation with clipping of LA appendage via right mini-thoracotomy approach   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   Complex valvuloplasty including artificial Gore-tex neochord placement x12 with 32mm Sorin Memo 4D ring annuloplasty   Seasonal allergies    Vaginal delivery    ONE NSVD   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    Past Surgical History:  Procedure Laterality Date   ANTERIOR CERVICAL DECOMP/DISCECTOMY FUSION  ~ 2003   BACK SURGERY     BREAST SURGERY     BREAST BIOPSY--RIGHT BENIGN   BUNIONECTOMY Bilateral    CARDIOVERSION N/A 01/26/2020   Procedure: CARDIOVERSION;  Surgeon: Barbaraann Darryle Ned, MD;  Location: [facility] ENDOSCOPY;  Service: Cardiovascular;  Laterality: N/A;   CARDIOVERSION N/A 10/27/2023   Procedure: CARDIOVERSION;  Surgeon: Shlomo Wilbert SAUNDERS, MD;  Location: [facility] INVASIVE CV LAB;  Service: Cardiovascular;  Laterality: N/A;   COSMETIC SURGERY  2016   back of my neck; related to earlier fusion   CYSTOSCOPY W/ STONE MANIPULATION  several times   DILATION AND CURETTAGE OF UTERUS     FOREHEAD RECONSTRUCTION Right    removed bone protruding out of my forehead   HARDWARE REMOVAL  2016   related to neck OR   INCONTINENCE SURGERY     IR THORACENTESIS ASP PLEURAL SPACE W/IMG GUIDE  02/16/2020   MINIMALLY INVASIVE MAZE PROCEDURE N/A 01/17/2020   Procedure: MINIMALLY INVASIVE MAZE PROCEDURE;  Surgeon: Dusty Sudie DEL, MD;  Location: [facility] OR;  Service: Open Heart Surgery;  Laterality: N/A;   MITRAL VALVE REPAIR Right 01/17/2020   Procedure: MINIMALLY INVASIVE MITRAL VALVE REPAIR (MVR) USING MEMO 4D ;  Surgeon: Dusty Sudie DEL, MD;  Location: [facility] OR;  Service: Open Heart Surgery;  Laterality: Right;   POSTERIOR CERVICAL FUSION/FORAMINOTOMY  ~ 2008; 2015   RIGHT/LEFT HEART CATH AND CORONARY ANGIOGRAPHY N/A 12/02/2019   Procedure: RIGHT/LEFT HEART CATH AND CORONARY ANGIOGRAPHY;  Surgeon: Verlin Lonni BIRCH, MD;  Location: [facility] INVASIVE CV LAB;  Service: Cardiovascular;  Laterality: N/A;   SHOULDER ARTHROSCOPY W/ ROTATOR CUFF REPAIR Right 2012   SPINAL FUSION  06/2014 - 2018 X ?7   scoliosis; my entire back   TEE WITHOUT CARDIOVERSION N/A 11/21/2019   Procedure: TRANSESOPHAGEAL ECHOCARDIOGRAM (TEE);  Surgeon: Delford Maude BROCKS, MD;  Location: [facility] ENDOSCOPY;  Service: Cardiovascular;  Laterality: N/A;   TEE WITHOUT CARDIOVERSION N/A 01/17/2020   Procedure: TRANSESOPHAGEAL ECHOCARDIOGRAM (TEE);  Surgeon: Dusty Sudie DEL, MD;  Location: [facility] OR;  Service: Open Heart Surgery;  Laterality: N/A;   TEE WITHOUT CARDIOVERSION N/A 01/26/2020   Procedure: TRANSESOPHAGEAL ECHOCARDIOGRAM (TEE);  Surgeon: Barbaraann Darryle Ned, MD;  Location: [facility] ENDOSCOPY;  Service: Cardiovascular;  Laterality: N/A;   TUBAL LIGATION     VAGINAL HYSTERECTOMY     TVH    Social History   Socioeconomic History   Marital status: Divorced    Spouse name: none/divorced   Number of children: Not on file   Years of education: Not on file   Highest education level: 12th grade  Occupational History   Occupation: retired  Tobacco Use   Smoking status: Former    Current packs/day: 0.00    Average packs/day: 0.1 packs/day for 14.0 years (1.4 ttl pk-yrs)    Types: Cigarettes    Start date: 1966    Quit date: 1980    Years since quitting: 45.5    Passive exposure: Past   Smokeless tobacco: Never   Tobacco comments:    Former smoker 12/07/23  Vaping Use   Vaping status: Never Used  Substance and Sexual Activity   Alcohol  use: Never   Drug use: Yes    Types: Oxycodone , Benzodiazepines    Comment: 03/31/2018 for chronic neck and back pain, takes Klonopin  at times.    Sexual activity: Not Currently  Other Topics Concern   Not on file  Social History Narrative   Lives by herself.    Social Drivers of Health   Financial Resource Strain: Medium Risk (10/12/2023)   Overall Financial Resource Strain (CARDIA)     Difficulty of Paying Living Expenses: Somewhat hard  Food Insecurity: Food Insecurity Present (10/12/2023)   Hunger Vital Sign    Worried About Running Out of Food in the Last Year: Sometimes true    Ran Out  of Food in the Last Year: Never true  Transportation Needs: No Transportation Needs (10/12/2023)   PRAPARE - Administrator, Civil Service (Medical): No    Lack of Transportation (Non-Medical): No  Physical Activity: Inactive (10/23/2022)   Exercise Vital Sign    Days of Exercise per Week: 0 days    Minutes of Exercise per Session: 0 min  Stress: Stress Concern Present (10/23/2022)   [employer] of Occupational Health - Occupational Stress Questionnaire    Feeling of Stress : To some extent  Social Connections: Socially Isolated (10/23/2022)   Social Connection and Isolation Panel    Frequency of Communication with Friends and Family: More than three times a week    Frequency of Social Gatherings with Friends and Family: Once a week    Attends Religious Services: Never    Database administrator or Organizations: No    Attends Banker Meetings: Never    Marital Status: Divorced  Catering manager Violence: Not At Risk (10/13/2023)   Received from [facility]   HITS    Over the last 12 months how often did your partner physically hurt you?: Never    Over the last 12 months how often did your partner insult you or talk down to you?: Never    Over the last 12 months how often did your partner threaten you with physical harm?: Never    Over the last 12 months how often did your partner scream or curse at you?: Never    Family History  Problem Relation Age of Onset   Diabetes Father    Hyperlipidemia Sister    Heart disease Sister    Stroke Sister    Diabetes Brother    Hyperlipidemia Sister    Heart disease Sister    Arthritis Mother    Heart disease Mother    Uterine cancer Mother    Diabetes Brother    Heart Problems Brother     ROS: no fevers  or chills, productive cough, hemoptysis, dysphasia, odynophagia, melena, hematochezia, dysuria, hematuria, rash, seizure activity, orthopnea, PND, pedal edema, claudication. Remaining systems are negative.  Physical Exam: Well-developed well-nourished in no acute distress.  Skin is warm and dry.  HEENT is normal.  Neck is supple.  Chest is clear to auscultation with normal expansion.  Cardiovascular exam is regular rate and rhythm.  Abdominal exam nontender or distended. No masses palpated. Extremities show trace edema. neuro grossly intact  ECG-December 06, 2023-sinus rhythm with nonspecific ST changes personally reviewed  EKG Interpretation Date/Time:  Wednesday December 30 2023 13:52:20 EDT Ventricular Rate:  62 PR Interval:  188 QRS Duration:  76 QT Interval:  446 QTC Calculation: 452 R Axis:   101  Text Interpretation: Normal sinus rhythm Confirmed by Pietro Rogue ([id number]) on 12/30/2023 1:54:31 PM    A/P  1 paroxysmal atrial fibrillation-patient is in sinus rhythm today status post recent cardioversion.  Continue metoprolol  and amiodarone .  She has had difficulties with recurrent nosebleeds intermittently requiring discontinuing anticoagulation.  She had left atrial appendage clipping at the time of her previous maze procedure.  Will discontinue anticoagulation.  Add aspirin  81 mg daily.  2 status post mitral valve repair-continue SBE prophylaxis.  No significant mitral stenosis noted on most recent echocardiogram.  3 hypertension-blood pressure controlled.  Continue present medical regimen.  4 nonobstructive coronary disease-she has declined statins.  5 chest pain-patient has had difficulties with chronic chest pain.  Electrocardiogram today shows no new ST changes.  Will follow.  6 chronic dyspnea-most recent echocardiogram showed normally functioning valve and she is not volume overloaded on examination.  Will not change present regimen.  7 obstructive sleep apnea-managed by  pulmonary.  8 severe anxiety-managed by primary care.  Redell Shallow, MD

## 2023-12-25 ENCOUNTER — Telehealth: Payer: Self-pay | Admitting: Cardiology

## 2023-12-25 NOTE — Telephone Encounter (Signed)
 Left message for patient to call back

## 2023-12-25 NOTE — Telephone Encounter (Signed)
 Pt returning call to nurse

## 2023-12-25 NOTE — Telephone Encounter (Signed)
 Pt c/o medication issue:  1. Name of Medication:   apixaban  (ELIQUIS ) 5 MG TABS tablet    2. How are you currently taking this medication (dosage and times per day)?   3. Are you having a reaction (difficulty breathing--STAT)?   4. What is your medication issue?   Patient says she has an important update on her pt assistance for Eliquis  and she would like to speak with Abran Abrahams if at all possible.

## 2023-12-28 DIAGNOSIS — M961 Postlaminectomy syndrome, not elsewhere classified: Secondary | ICD-10-CM | POA: Diagnosis not present

## 2023-12-28 DIAGNOSIS — M4693 Unspecified inflammatory spondylopathy, cervicothoracic region: Secondary | ICD-10-CM | POA: Diagnosis not present

## 2023-12-28 DIAGNOSIS — M5412 Radiculopathy, cervical region: Secondary | ICD-10-CM | POA: Diagnosis not present

## 2023-12-28 DIAGNOSIS — G894 Chronic pain syndrome: Secondary | ICD-10-CM | POA: Diagnosis not present

## 2023-12-28 DIAGNOSIS — Z79891 Long term (current) use of opiate analgesic: Secondary | ICD-10-CM | POA: Diagnosis not present

## 2023-12-29 NOTE — Telephone Encounter (Signed)
 Spoke with pt, she is wondering when she is going to be able to get patient assistance. Spoke with [employer], they report she will be eligible in August. They no longer give out dollar amount needed. Aware she has put her most recent pharmacy cost in the mail to them. She has a follow up appointment tomorrow with dr pietro.

## 2023-12-30 ENCOUNTER — Ambulatory Visit (INDEPENDENT_AMBULATORY_CARE_PROVIDER_SITE_OTHER): Admitting: Cardiology

## 2023-12-30 ENCOUNTER — Encounter: Payer: Self-pay | Admitting: Cardiology

## 2023-12-30 VITALS — BP 118/60 | HR 69 | Ht 66.0 in | Wt 108.0 lb

## 2023-12-30 DIAGNOSIS — I4819 Other persistent atrial fibrillation: Secondary | ICD-10-CM | POA: Diagnosis not present

## 2023-12-30 DIAGNOSIS — R0602 Shortness of breath: Secondary | ICD-10-CM

## 2023-12-30 DIAGNOSIS — R072 Precordial pain: Secondary | ICD-10-CM | POA: Diagnosis not present

## 2023-12-30 DIAGNOSIS — Z9889 Other specified postprocedural states: Secondary | ICD-10-CM

## 2023-12-30 MED ORDER — ASPIRIN 81 MG PO TBEC: 81.0000 mg | DELAYED_RELEASE_TABLET | Freq: Every day | ORAL | Status: DC

## 2023-12-30 NOTE — Patient Instructions (Addendum)
 Medication Instructions:   STOP ELIQUIS   START ASPIRIN  81 MG ONCE DAILY  *If you need a refill on your cardiac medications before your next appointment, please call your pharmacy*  Follow-Up: At [facility], you and your health needs are our priority.  As part of our continuing mission to provide you with exceptional heart care, our providers are all part of one team.  This team includes your primary Cardiologist (physician) and Advanced Practice Providers or APPs (Physician Assistants and Nurse Practitioners) who all work together to provide you with the care you need, when you need it.  Your next appointment:   6 month(s)  Provider:   Redell Shallow, MD

## 2023-12-31 ENCOUNTER — Other Ambulatory Visit: Payer: Self-pay | Admitting: Family Medicine

## 2023-12-31 NOTE — Telephone Encounter (Signed)
 Requested Prescriptions   Pending Prescriptions Disp Refills   meclizine  (ANTIVERT ) 25 MG tablet [Pharmacy Med Name: Meclizine  HCl 25 MG Oral Tablet] 45 tablet 0    Sig: TAKE 1 TABLET BY MOUTH THREE TIMES DAILY AS NEEDED FOR DIZZINESS     Date of patient request: 12/31/2023 Last office visit: 11/13/2023 Upcoming visit: Visit date not found Date of last refill: 08/24/2023 Last refill amount: 45

## 2024-01-04 DIAGNOSIS — R911 Solitary pulmonary nodule: Secondary | ICD-10-CM | POA: Diagnosis not present

## 2024-01-04 DIAGNOSIS — K219 Gastro-esophageal reflux disease without esophagitis: Secondary | ICD-10-CM | POA: Diagnosis not present

## 2024-01-04 DIAGNOSIS — M419 Scoliosis, unspecified: Secondary | ICD-10-CM | POA: Diagnosis not present

## 2024-01-04 DIAGNOSIS — G894 Chronic pain syndrome: Secondary | ICD-10-CM | POA: Diagnosis not present

## 2024-01-04 DIAGNOSIS — E43 Unspecified severe protein-calorie malnutrition: Secondary | ICD-10-CM | POA: Diagnosis not present

## 2024-01-04 DIAGNOSIS — I10 Essential (primary) hypertension: Secondary | ICD-10-CM | POA: Diagnosis not present

## 2024-01-04 DIAGNOSIS — E039 Hypothyroidism, unspecified: Secondary | ICD-10-CM | POA: Diagnosis not present

## 2024-01-04 DIAGNOSIS — G603 Idiopathic progressive neuropathy: Secondary | ICD-10-CM | POA: Diagnosis not present

## 2024-01-04 DIAGNOSIS — G629 Polyneuropathy, unspecified: Secondary | ICD-10-CM | POA: Diagnosis not present

## 2024-01-04 DIAGNOSIS — I499 Cardiac arrhythmia, unspecified: Secondary | ICD-10-CM | POA: Diagnosis not present

## 2024-01-04 DIAGNOSIS — N301 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: Secondary | ICD-10-CM | POA: Diagnosis not present

## 2024-01-04 DIAGNOSIS — M810 Age-related osteoporosis without current pathological fracture: Secondary | ICD-10-CM | POA: Diagnosis not present

## 2024-01-07 ENCOUNTER — Telehealth: Payer: Self-pay | Admitting: Cardiology

## 2024-01-07 NOTE — Telephone Encounter (Signed)
 Call to patient to discuss symptoms. Patient reports weight gain with chronic ankle swelling, denies SOB, chest pain, palps.   Patient saw J. Cleaver, NP on 12/01/23 at was 106# at that time. It was noted she had mild ankle edema but was not SOB.  Patient saw Dr. Pietro on 12/30/23 and chronic dyspnea-most recent echocardiogram showed normally functioning valve and she is not volume overloaded on examination. Will not change present regimen. Her weight at that time was 108 lb.   Today she reports her weight is 109 lb and she feels her ankle swelling is worse. She states she is unable to elevate legs or wear compression hose due to history of back and orthopedic surgery. She endorses taking her lasix  as ordered, alternating 40 and 60 mg doses.  She does not track her BP or HR at home. Discussed with Regina Beauvais, NP, Made appt with Dr. Pietro on DOD schedule 01/12/24. Advised patient that Dr. Pietro may recommend a different plan and that I would call her if that occurred.

## 2024-01-07 NOTE — Telephone Encounter (Signed)
 Pt c/o swelling/edema: STAT if pt has developed SOB within 24 hours  If swelling, where is the swelling located?   Ankles  How much weight have you gained and in what time span? Not sure  Have you gained 2 pounds in a day or 5 pounds in a week?   Patient gained close to 4 pounds   Do you have a log of your daily weights (if so, list)?   No  Are you currently taking a fluid pill?  Yes  Are you currently SOB?  Off and on  Have you traveled recently in a car or plane for an extended period of time?  No  Patient wants a call back regarding weight gain and fluid.

## 2024-01-08 NOTE — Progress Notes (Unsigned)
 HPI: Follow-up chronic dyspnea, previous mitral valve repair, atrial fibrillation.  Patient underwent minimally invasive mitral valve repair, complete maze procedure, clipping of left atrial appendage and sternotomy with repair of left ventricular free wall laceration July 2021.  Note preoperative catheterization May 2021 showed 20% right coronary artery and 20% distal LAD.  She has had difficulties with continuing dyspnea and chest pain. Chest CT August 2023 showed right middle lobe and left upper lobe nodularity suggestive of atypical infection, nodule in the left major fissure.  ABIs May 2024 normal.  Echocardiogram July 2024 showed normal LV function, moderate RV dysfunction, moderate right ventricular enlargement, moderate pulm hypertension, moderate right atrial enlargement, status post mitral valve repair with mean gradient 2 mmHg and trace mitral regurgitation, moderate tricuspid regurgitation, mild aortic insufficiency and moderate pulmonic insufficiency.  She has also been diagnosed with obstructive sleep apnea and is now on CPAP.  Also with recurrent nosebleeds.  Had cardioversion 10/27/23.  Contacted the office recently with increased lower extremity edema and weight gain and added to my schedule today.  Since last seen patient states that she gained 3 pounds in 1 day.  She denies increased dyspnea.  Occasional mild chest tightness which is unchanged.  No pedal edema.  She complains of dizziness which has been intermittent for 2 years.  She had episodes of disorientation in bed by her report.  Current Outpatient Medications  Medication Sig Dispense Refill   acetaminophen  (TYLENOL ) 500 MG tablet Take 500 mg by mouth every 6 (six) hours as needed (pain.).     amiodarone  (PACERONE ) 200 MG tablet Take 200 mg by mouth daily.     aspirin  EC 81 MG tablet Take 1 tablet (81 mg total) by mouth daily. Swallow whole.     calcium  carbonate (OS-CAL) 1250 (500 Ca) MG chewable tablet Chew 1 tablet by mouth  every evening.     Calcium  Carbonate Antacid (TUMS CHEWY BITES PO) Take 1 tablet by mouth daily as needed (reflux).      Carboxymethylcellul-Glycerin  (LUBRICATING EYE DROPS OP) Place 1 drop into both eyes 3 (three) times daily as needed (dry/irritated eys.).     cephALEXin  (KEFLEX ) 500 MG capsule Take 2 grams (4 tablets) by mouth 1 hour prior to dental cleaning/procedure. 4 capsule 2   Cholecalciferol (VITAMIN D -3 PO) Take 1 tablet by mouth every evening.     clonazePAM  (KLONOPIN ) 0.5 MG tablet Take 1 tablet by mouth twice daily as needed for anxiety (Patient taking differently: Take 0.125-0.25 mg by mouth 2 (two) times daily as needed for anxiety.) 30 tablet 3   cyanocobalamin  (VITAMIN B12) 1000 MCG/ML injection Inject 1,000 mcg into the muscle every 30 (thirty) days.     denosumab  (PROLIA ) 60 MG/ML SOSY injection Inject 60 mg into the skin every 6 (six) months.     furosemide  (LASIX ) 20 MG tablet Take 20 mg by mouth every Tuesday, Thursday, and Saturday at 6 PM. Per patient taking 60 mg on these days     furosemide  (LASIX ) 40 MG tablet Take 1 tablet (40 mg total) by mouth daily.     levalbuterol  (XOPENEX  HFA) 45 MCG/ACT inhaler Inhale 2 puffs into the lungs every 4 (four) hours as needed for wheezing. 1 each 6   levothyroxine  (SYNTHROID ) 75 MCG tablet Take 1 tablet by mouth once daily 90 tablet 0   loratadine (CLARITIN) 10 MG tablet Take 10 mg by mouth in the morning.     meclizine  (ANTIVERT ) 25 MG tablet TAKE 1 TABLET  BY MOUTH THREE TIMES DAILY AS NEEDED FOR DIZZINESS 45 tablet 0   metoprolol  tartrate (LOPRESSOR ) 25 MG tablet Take 1 tablet by mouth twice daily 180 tablet 3   mupirocin ointment (BACTROBAN) 2 % Apply 1 Application topically daily.     nystatin  (MYCOSTATIN /NYSTOP ) powder Apply topically 2 (two) times daily as needed (skin irritation (under breasts)). 15 g 0   ondansetron  (ZOFRAN ) 8 MG tablet Take 1 tablet (8 mg total) by mouth every 8 (eight) hours as needed for nausea or vomiting.  45 tablet 1   oxyCODONE  (ROXICODONE ) 5 MG/5ML solution Take 4 mLs (4 mg total) by mouth every 4 (four) hours as needed for moderate pain. (Patient taking differently: Take 5 mg by mouth every 4 (four) hours as needed (pain.).)  0   pantoprazole  (PROTONIX ) 40 MG tablet Take 1 tablet by mouth once daily 30 tablet 3   polyethylene glycol (MIRALAX  / GLYCOLAX ) packet Take 17 g by mouth daily as needed for mild constipation.     potassium chloride  SA (KLOR-CON  M) 20 MEQ tablet Take 1 tablet (20 mEq total) by mouth 3 (three) times a week. Take on Tuesdays, Thursdays and Saturdays when you take Lasix  60 mg.     revefenacin  (YUPELRI ) 175 MCG/3ML nebulizer solution Inhale one vial in nebulizer once daily. Do not mix with other nebulized medications. 3 mL 11   sodium chloride  (OCEAN) 0.65 % SOLN nasal spray Place 1 spray into both nostrils as needed for congestion (nose bleeds).     Ubrogepant (UBRELVY) 100 MG TABS Take 25 mg by mouth daily as needed (headaches/migraines).     valACYclovir  (VALTREX ) 500 MG tablet Take 500 mg by mouth daily as needed (breakouts).     No current facility-administered medications for this visit.     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   Mitral regurgitation    Osteopenia 02/18/2012   Osteoporosis    PAF (paroxysmal atrial fibrillation) (HCC) 2012   Peripheral neuropathy 11/06/2011   PMDD (premenstrual dysphoric disorder)    PONV (postoperative nausea  and vomiting)    nausea, vomiting, hives and dizziness    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/2020   Complete bilateral atrial lesion set using cryothermy and bipolar radiofrequency ablation with clipping of LA appendage via right mini-thoracotomy approach   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   Complex valvuloplasty including artificial Gore-tex neochord placement x12 with 32mm Sorin Memo 4D ring annuloplasty   Seasonal allergies    Vaginal delivery    ONE NSVD   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    Past Surgical History:  Procedure Laterality Date   ANTERIOR CERVICAL DECOMP/DISCECTOMY FUSION  ~ 2003   BACK SURGERY     BREAST SURGERY     BREAST BIOPSY--RIGHT BENIGN   BUNIONECTOMY Bilateral    CARDIOVERSION N/A 01/26/2020   Procedure: CARDIOVERSION;  Surgeon: Barbaraann Darryle Ned,  MD;  Location: [facility] ENDOSCOPY;  Service: Cardiovascular;  Laterality: N/A;   CARDIOVERSION N/A 10/27/2023   Procedure: CARDIOVERSION;  Surgeon: Shlomo Wilbert SAUNDERS, MD;  Location: [facility] INVASIVE CV LAB;  Service: Cardiovascular;  Laterality: N/A;   COSMETIC SURGERY  2016   back of my neck; related to earlier fusion   CYSTOSCOPY W/ STONE MANIPULATION  several times   DILATION AND CURETTAGE OF UTERUS     FOREHEAD RECONSTRUCTION Right    removed bone protruding out of my forehead   HARDWARE REMOVAL  2016   related to neck OR   INCONTINENCE SURGERY     IR THORACENTESIS ASP PLEURAL SPACE W/IMG GUIDE  02/16/2020   MINIMALLY INVASIVE MAZE PROCEDURE N/A 01/17/2020   Procedure: MINIMALLY INVASIVE MAZE PROCEDURE;  Surgeon: Dusty Sudie DEL, MD;  Location: [facility] OR;  Service: Open Heart Surgery;  Laterality: N/A;   MITRAL VALVE REPAIR Right 01/17/2020   Procedure: MINIMALLY INVASIVE MITRAL VALVE REPAIR (MVR) USING MEMO 4D ;  Surgeon: Dusty Sudie DEL, MD;  Location: [facility] OR;  Service: Open Heart Surgery;  Laterality: Right;   POSTERIOR CERVICAL FUSION/FORAMINOTOMY  ~ 2008; 2015   RIGHT/LEFT HEART CATH AND CORONARY  ANGIOGRAPHY N/A 12/02/2019   Procedure: RIGHT/LEFT HEART CATH AND CORONARY ANGIOGRAPHY;  Surgeon: Verlin Lonni BIRCH, MD;  Location: [facility] INVASIVE CV LAB;  Service: Cardiovascular;  Laterality: N/A;   SHOULDER ARTHROSCOPY W/ ROTATOR CUFF REPAIR Right 2012   SPINAL FUSION  06/2014 - 2018 X ?7   scoliosis; my entire back   TEE WITHOUT CARDIOVERSION N/A 11/21/2019   Procedure: TRANSESOPHAGEAL ECHOCARDIOGRAM (TEE);  Surgeon: Delford Maude BROCKS, MD;  Location: [facility] ENDOSCOPY;  Service: Cardiovascular;  Laterality: N/A;   TEE WITHOUT CARDIOVERSION N/A 01/17/2020   Procedure: TRANSESOPHAGEAL ECHOCARDIOGRAM (TEE);  Surgeon: Dusty Sudie DEL, MD;  Location: [facility] OR;  Service: Open Heart Surgery;  Laterality: N/A;   TEE WITHOUT CARDIOVERSION N/A 01/26/2020   Procedure: TRANSESOPHAGEAL ECHOCARDIOGRAM (TEE);  Surgeon: Barbaraann Darryle Ned, MD;  Location: [facility] ENDOSCOPY;  Service: Cardiovascular;  Laterality: N/A;   TUBAL LIGATION     VAGINAL HYSTERECTOMY     TVH    Social History   Socioeconomic History   Marital status: Divorced    Spouse name: none/divorced   Number of children: Not on file   Years of education: Not on file   Highest education level: 12th grade  Occupational History   Occupation: retired  Tobacco Use   Smoking status: Former    Current packs/day: 0.00    Average packs/day: 0.1 packs/day for 14.0 years (1.4 ttl pk-yrs)    Types: Cigarettes    Start date: 1966    Quit date: 1980    Years since quitting: 45.5    Passive exposure: Past   Smokeless tobacco: Never   Tobacco comments:    Former smoker 12/07/23  Vaping Use   Vaping status: Never Used  Substance and Sexual Activity   Alcohol  use: Never   Drug use: Yes    Types: Oxycodone , Benzodiazepines    Comment: 03/31/2018 for chronic neck and back pain, takes Klonopin  at times.    Sexual activity: Not Currently  Other Topics Concern   Not on file  Social History Narrative   Lives by herself.    Social Drivers of Health    Financial Resource Strain: Medium Risk (10/12/2023)   Overall Financial Resource Strain (CARDIA)    Difficulty of Paying Living Expenses: Somewhat hard  Food Insecurity: Food Insecurity Present (10/12/2023)   Hunger Vital  Sign    Worried About Programme researcher, broadcasting/film/video in the Last Year: Sometimes true    Ran Out of Food in the Last Year: Never true  Transportation Needs: No Transportation Needs (10/12/2023)   PRAPARE - Administrator, Civil Service (Medical): No    Lack of Transportation (Non-Medical): No  Physical Activity: Inactive (10/23/2022)   Exercise Vital Sign    Days of Exercise per Week: 0 days    Minutes of Exercise per Session: 0 min  Stress: Stress Concern Present (10/23/2022)   [employer] of Occupational Health - Occupational Stress Questionnaire    Feeling of Stress : To some extent  Social Connections: Socially Isolated (10/23/2022)   Social Connection and Isolation Panel    Frequency of Communication with Friends and Family: More than three times a week    Frequency of Social Gatherings with Friends and Family: Once a week    Attends Religious Services: Never    Database administrator or Organizations: No    Attends Banker Meetings: Never    Marital Status: Divorced  Catering manager Violence: Not At Risk (10/13/2023)   Received from [facility]   HITS    Over the last 12 months how often did your partner physically hurt you?: Never    Over the last 12 months how often did your partner insult you or talk down to you?: Never    Over the last 12 months how often did your partner threaten you with physical harm?: Never    Over the last 12 months how often did your partner scream or curse at you?: Never    Family History  Problem Relation Age of Onset   Diabetes Father    Hyperlipidemia Sister    Heart disease Sister    Stroke Sister    Diabetes Brother    Hyperlipidemia Sister    Heart disease Sister    Arthritis Mother    Heart disease  Mother    Uterine cancer Mother    Diabetes Brother    Heart Problems Brother     ROS: no fevers or chills, productive cough, hemoptysis, dysphasia, odynophagia, melena, hematochezia, dysuria, hematuria, rash, seizure activity, orthopnea, PND, pedal edema, claudication. Remaining systems are negative.  Physical Exam: Well-developed well-nourished in no acute distress.  Skin is warm and dry.  HEENT is normal.  Neck is supple.  Chest is clear to auscultation with normal expansion.  Cardiovascular exam is regular rate and rhythm.  Abdominal exam nontender or distended. No masses palpated. Extremities show no edema. neuro grossly intact  EKG Interpretation Date/Time:  Tuesday January 12 2024 90:71:75 EDT Ventricular Rate:  55 PR Interval:    QRS Duration:  78 QT Interval:  458 QTC Calculation: 438 R Axis:   95  Text Interpretation: Sinus bradycardia Rightward axis Low voltage QRS Non-specific ST-t changes Confirmed by Pietro Rogue ([id number]) on 01/12/2024 9:31:17 AM    A/P  1 paroxysmal atrial fibrillation-patient remains in sinus rhythm.  Continue amiodarone  and metoprolol .  Anticoagulation discontinued previously as she was having recurrent nosebleeds and it was noted she had previous left atrial appendage clipping at the time of her previous maze procedure.  Continue aspirin  81 mg daily.  2 status post mitral valve repair-continue SBE prophylaxis.  3 hypertension-patient's blood pressure is controlled.  Continue present medications.  4 chronic dyspnea-most recent echocardiogram showed normally functioning valve as well as normal LV function.  She is not significantly volume overloaded on examination.  Continue diuretic at present dose.  Note I did tell her to take an additional 20 mg of Lasix  daily for weight gain of 2 to 3 pounds.  5 nonobstructive coronary artery disease-previously declined statins.  6 history of chest pain-no new symptoms compared to last office visit.  7  obstructive sleep apnea-Per pulmonary.  8 severe anxiety-Per primary care.  Redell Shallow, MD

## 2024-01-12 ENCOUNTER — Ambulatory Visit: Attending: Cardiology | Admitting: Cardiology

## 2024-01-12 ENCOUNTER — Encounter: Payer: Self-pay | Admitting: Cardiology

## 2024-01-12 VITALS — BP 120/60 | HR 55 | Ht 66.0 in | Wt 108.0 lb

## 2024-01-12 DIAGNOSIS — Z9889 Other specified postprocedural states: Secondary | ICD-10-CM | POA: Diagnosis not present

## 2024-01-12 DIAGNOSIS — R0602 Shortness of breath: Secondary | ICD-10-CM | POA: Diagnosis not present

## 2024-01-12 DIAGNOSIS — I10 Essential (primary) hypertension: Secondary | ICD-10-CM | POA: Diagnosis not present

## 2024-01-12 DIAGNOSIS — I4819 Other persistent atrial fibrillation: Secondary | ICD-10-CM | POA: Diagnosis not present

## 2024-01-12 DIAGNOSIS — R072 Precordial pain: Secondary | ICD-10-CM | POA: Insufficient documentation

## 2024-01-12 NOTE — Patient Instructions (Signed)
 Medication Instructions:   MAY TAKE EXTRA 20 MG OF FUROSEMIDE  FOR WEIGHT GAIN OF 3 LBS  *If you need a refill on your cardiac medications before your next appointment, please call your pharmacy*   Follow-Up: At [facility], you and your health needs are our priority.  As part of our continuing mission to provide you with exceptional heart care, our providers are all part of one team.  This team includes your primary Cardiologist (physician) and Advanced Practice Providers or APPs (Physician Assistants and Nurse Practitioners) who all work together to provide you with the care you need, when you need it.  Your next appointment:   AS SCHEDULED

## 2024-01-13 ENCOUNTER — Telehealth: Payer: Self-pay | Admitting: Cardiology

## 2024-01-13 NOTE — Telephone Encounter (Signed)
 Pt would like to know if she's able to take motion sickness medication. Please advise

## 2024-01-14 ENCOUNTER — Ambulatory Visit (INDEPENDENT_AMBULATORY_CARE_PROVIDER_SITE_OTHER): Admitting: Student in an Organized Health Care Education/Training Program

## 2024-01-14 ENCOUNTER — Ambulatory Visit: Payer: Self-pay

## 2024-01-14 ENCOUNTER — Encounter: Payer: Self-pay | Admitting: Student in an Organized Health Care Education/Training Program

## 2024-01-14 VITALS — BP 150/69 | HR 58 | Wt 108.0 lb

## 2024-01-14 DIAGNOSIS — H8190 Unspecified disorder of vestibular function, unspecified ear: Secondary | ICD-10-CM | POA: Insufficient documentation

## 2024-01-14 DIAGNOSIS — R42 Dizziness and giddiness: Secondary | ICD-10-CM | POA: Insufficient documentation

## 2024-01-14 NOTE — Telephone Encounter (Signed)
Spoke to patient PharmD's advice given. °

## 2024-01-14 NOTE — Assessment & Plan Note (Addendum)
 Severe dizziness with differential including stroke, medication side effects, or neurological issues.  Patient has a very complicated medical history, extremely high frailty of advanced age.  She has had intermittent issues with dizziness, likely multifactorial for many years, but reports that this episode is more severe and different than the past.  Recent Eliquis  discontinuation raises stroke concern due to atrial fibrillation and mitral valve repair. Medication changes and new start yupelri  use may contribute, about a 1% risk.  Polypharmacy certainly a big issue in this case.  I recommended continuing to withhold clonazepam , I do not think meclizine  would help.  Oxycodone  may be contributing here. - Order MRI of the brain to rule out posterior circulation infarction. - Reviewed medication list for potential side effects causing dizziness. - Advise discontinuation of clonazepam  and Yupelri . - Recommend family support at home for assistance.

## 2024-01-14 NOTE — Patient Instructions (Signed)
  VISIT SUMMARY: Today, we discussed your severe dizziness, which has worsened over the past few weeks and is affecting your daily activities. We reviewed your medical history, including your scoliosis surgeries, mitral valve repair, and recent medication changes. We also talked about your chronic headaches, atrial fibrillation, and the use of your nebulizer.  YOUR PLAN: -DIZZINESS: Your severe dizziness could be due to several factors, including a possible stroke, medication side effects, or neurological issues. We will order an MRI of your brain to check for a stroke and review your medications to identify any that might be causing dizziness. You should stop taking clonazepam , and it's important to have family support at home to help you manage daily activities.  -ATRIAL FIBRILLATION: Atrial fibrillation is an irregular heart rhythm that increases your risk of stroke. Since you stopped taking Eliquis  due to severe nosebleeds, you are at a higher risk for stroke. We need to monitor this closely.  -CHRONIC HEADACHE: Your chronic headaches have worsened with the onset of dizziness. You are currently managing the pain with Tylenol  and oxycodone , but the headaches may also be related to stress.  -PULMONARY HYPERTENSION: Pulmonary hypertension is high blood pressure in the lungs. You mentioned that your nebulizer might be contributing to your dizziness, but we are not certain of the contents of your nebulizer medication.  -NEUROPATHY: Neuropathy is nerve damage that causes pain and weakness. You are currently seeing a neurologist for this, but you are considering changing providers due to dissatisfaction with your current care.  -SCOLIOSIS WITH SURGICAL HISTORY: Your history of scoliosis and multiple surgeries has led to persistent right-sided weakness and significant functional limitations. This is likely contributing to your current symptoms.  -GENERAL HEALTH MAINTENANCE: Given your significant  functional limitations due to dizziness, it is important to have family support to assist with daily activities.  INSTRUCTIONS: Please schedule an MRI of your brain in [place] to evaluate for a possible stroke. Follow up with us  to review the MRI results and discuss further management of your dizziness. Ensure you have family support at home to help with daily activities. Stop taking clonazepam  as advised.

## 2024-01-14 NOTE — Telephone Encounter (Signed)
 FYI Only or Action Required?: FYI only for provider.  Patient was last seen in primary care on 11/13/2023 by [name], MD.  Called Nurse Triage reporting Dizziness.  Symptoms began several weeks ago.  Interventions attempted: Prescription medications: meclizine  .  Symptoms are: gradually worsening.  Triage Disposition: See Physician Within 24 Hours  Patient/caregiver understands and will follow disposition?: Yes  Pt states dizziness has gotten worse in past several days. Has been taking Meclizine  but not really effective. Scheduled appt today 420 with Dr. Jerrell. Pt will call back if unable to find ride d/t she doesn't feel safe driving.   Copied from CRM [id number]. Topic: Clinical - Red Word Triage >> Jan 14, 2024  8:32 AM [name] wrote: Red Word that prompted transfer to Nurse Triage: Patient having severe dizziness, requested a appt Reason for Disposition  [1] MODERATE dizziness (e.g., vertigo; feels very unsteady, interferes with normal activities) AND [2] has NOT been evaluated by doctor (or NP/PA) for this  Answer Assessment - Initial Assessment Questions 2. VERTIGO: Do you feel like either you or the room is spinning or tilting?      yes 3. LIGHTHEADED: Do you feel lightheaded? (e.g., somewhat faint, woozy, weak upon standing)     Unsure  4. SEVERITY: How bad is it?  Can you walk?     Feeling weakness, having to hold on to something  5. ONSET:  When did the dizziness begin?     Several days have gotten worse 6. AGGRAVATING FACTORS: Does anything make it worse? (e.g., standing, change in head position)     Any movement, laying down isn't as bad  7. CAUSE: What do you think is causing the dizziness?     Vertigo  8. RECURRENT SYMPTOM: Have you had dizziness before? If Yes, ask: When was the last time? What happened that time?     Taking Meclizine   9. OTHER SYMPTOMS: Do you have any other symptoms? (e.g., earache, headache, numbness, tinnitus,  vomiting, weakness)     HA on and off 2-3 months ago, nausea  Protocols used: Dizziness - Vertigo-A-AH

## 2024-01-14 NOTE — Progress Notes (Addendum)
 Acute Office Visit  Subjective:     Patient ID: [name], female    DOB: June 25, 1946, 78 y.o.   MRN: [id number]  Chief Complaint  Patient presents with   Dizziness    Is taking meclizine  but does not seem to be helping. Patient states the dizziness is getting worse.    HPI  Discussed the use of AI scribe software for clinical note transcription with the patient, who gave verbal consent to proceed.  History of Present Illness [name] is a 78 year old female with a history of scoliosis surgery and mitral valve repair who presents with extreme dizziness.  She experiences extreme dizziness that has progressively worsened over the past few weeks, severe enough to prevent walking or driving, with relief only when lying down. She has a history of intermittent mild dizziness, previously managed with meclizine , but this episode is significantly worse. Over-the-counter Dramamine provided no relief.  She has a history of scoliosis with multiple surgeries, including seven operations on her back and neck, with the last surgery in 2019. She reports having a rod in her head and back as a result of her surgeries. She also underwent open-heart surgery for mitral valve repair and was recently taken off Eliquis  due to severe epistaxis.  She uses a nebulizer for a lung condition, which is not cancer. She started using a compound medication through the nebulizer almost nightly, noticing an increase in dizziness after starting this treatment.  She has a history of atrial fibrillation and underwent a procedure to correct her heart rhythm. She was previously on clonazepam  for anxiety, prescribed by a psychiatrist, but recently stopped taking it abruptly. She has been on oxycodone  for several years for neck and back pain, with a recent change in the manufacturer of her liquid oxycodone , which she suspects might be contributing to her symptoms.  She reports a history of severe headaches that began in  March, for which she underwent an MRI. The headaches have persisted and worsened with the onset of dizziness. She takes Tylenol  with oxycodone  for pain relief, but the headaches have not completely resolved.  She lives alone and is currently experiencing difficulty managing daily activities due to her symptoms. Her family, including her sisters, provides some support, but she is primarily independent.        Objective:    BP (!) 150/69   Pulse (!) 58   Wt 108 lb (49 kg)   BMI 17.43 kg/m    Physical Exam  Gen: Frail-appearing older woman Neuro: Alert, conversational, slow get up and go, hunched over gait, difficult to get up onto the table, full strength in the upper and lower extremities, no focal neurodeficits, normal sensation throughout, unable to do HINTS exam due to multiple cervical spine surgeries, very limited range of motion of her neck.  No nystagmus on exam. Heart: Regular, 2 out of 6 holosystolic murmur at the left lower sternal border Lungs: Unlabored, clear throughout      Assessment & Plan:    Problem List Items Addressed This Visit       Unprioritized   Acute vestibular syndrome - Primary   Severe dizziness with differential including stroke, medication side effects, or neurological issues.  Patient has a very complicated medical history, extremely high frailty of advanced age.  She has had intermittent issues with dizziness, likely multifactorial for many years, but reports that this episode is more severe and different than the past.  Recent Eliquis  discontinuation raises stroke  concern due to atrial fibrillation and mitral valve repair. Medication changes and new start yupelri  use may contribute, about a 1% risk.  Polypharmacy certainly a big issue in this case.  I recommended continuing to withhold clonazepam , I do not think meclizine  would help.  Oxycodone  may be contributing here. - Order MRI of the brain to rule out posterior circulation infarction. -  Reviewed medication list for potential side effects causing dizziness. - Advise discontinuation of clonazepam  and Yupelri . - Recommend family support at home for assistance.      Relevant Orders   MR Brain Wo Contrast   I spent 35 minutes with the patient conducting history taking, physical examination, and medical decision making. This time was medically necessary given the complexity of the patient's concerns and conditions.    Return in about 2 weeks (around 01/28/2024).  Cleatus Debby Specking, MD

## 2024-01-15 ENCOUNTER — Ambulatory Visit: Payer: Self-pay

## 2024-01-15 ENCOUNTER — Other Ambulatory Visit: Payer: Self-pay

## 2024-01-15 ENCOUNTER — Encounter (HOSPITAL_COMMUNITY): Payer: Self-pay

## 2024-01-15 ENCOUNTER — Emergency Department (HOSPITAL_COMMUNITY)
Admission: EM | Admit: 2024-01-15 | Discharge: 2024-01-15 | Disposition: A | Discharge status: Home / Self Care | Attending: Emergency Medicine | Admitting: Emergency Medicine

## 2024-01-15 ENCOUNTER — Emergency Department (HOSPITAL_COMMUNITY)

## 2024-01-15 DIAGNOSIS — R519 Headache, unspecified: Secondary | ICD-10-CM | POA: Diagnosis not present

## 2024-01-15 DIAGNOSIS — I6782 Cerebral ischemia: Secondary | ICD-10-CM | POA: Diagnosis not present

## 2024-01-15 DIAGNOSIS — Z91040 Latex allergy status: Secondary | ICD-10-CM | POA: Diagnosis not present

## 2024-01-15 DIAGNOSIS — Z7982 Long term (current) use of aspirin: Secondary | ICD-10-CM | POA: Insufficient documentation

## 2024-01-15 DIAGNOSIS — R42 Dizziness and giddiness: Secondary | ICD-10-CM | POA: Diagnosis not present

## 2024-01-15 DIAGNOSIS — G43809 Other migraine, not intractable, without status migrainosus: Secondary | ICD-10-CM | POA: Insufficient documentation

## 2024-01-15 DIAGNOSIS — R29818 Other symptoms and signs involving the nervous system: Secondary | ICD-10-CM | POA: Diagnosis not present

## 2024-01-15 DIAGNOSIS — R9082 White matter disease, unspecified: Secondary | ICD-10-CM | POA: Diagnosis not present

## 2024-01-15 DIAGNOSIS — I639 Cerebral infarction, unspecified: Secondary | ICD-10-CM | POA: Diagnosis not present

## 2024-01-15 LAB — COMPREHENSIVE METABOLIC PANEL WITH GFR
ALT: 29 U/L (ref 0–44)
AST: 42 U/L — ABNORMAL HIGH (ref 15–41)
Albumin: 3.7 g/dL (ref 3.5–5.0)
Alkaline Phosphatase: 77 U/L (ref 38–126)
Anion gap: 10 (ref 5–15)
BUN: 23 mg/dL (ref 8–23)
CO2: 30 mmol/L (ref 22–32)
Calcium: 8.8 mg/dL — ABNORMAL LOW (ref 8.9–10.3)
Chloride: 100 mmol/L (ref 98–111)
Creatinine, Ser: 0.97 mg/dL (ref 0.44–1.00)
GFR, Estimated: 59 mL/min — ABNORMAL LOW (ref >60)
Glucose, Bld: 101 mg/dL — ABNORMAL HIGH (ref 70–99)
Potassium: 3.9 mmol/L (ref 3.5–5.1)
Sodium: 140 mmol/L (ref 135–145)
Total Bilirubin: 1.1 mg/dL (ref 0.0–1.2)
Total Protein: 7 g/dL (ref 6.5–8.1)

## 2024-01-15 LAB — URINALYSIS, ROUTINE W REFLEX MICROSCOPIC
Bilirubin Urine: NEGATIVE
Glucose, UA: NEGATIVE mg/dL
Hgb urine dipstick: NEGATIVE
Ketones, ur: NEGATIVE mg/dL
Leukocytes,Ua: NEGATIVE
Nitrite: NEGATIVE
Protein, ur: NEGATIVE mg/dL
Specific Gravity, Urine: 1.006 (ref 1.005–1.030)
pH: 7 (ref 5.0–8.0)

## 2024-01-15 LAB — CBC
HCT: 39.5 % (ref 36.0–46.0)
Hemoglobin: 12.5 g/dL (ref 12.0–15.0)
MCH: 30.7 pg (ref 26.0–34.0)
MCHC: 31.6 g/dL (ref 30.0–36.0)
MCV: 97.1 fL (ref 80.0–100.0)
Platelets: 155 K/uL (ref 150–400)
RBC: 4.07 MIL/uL (ref 3.87–5.11)
RDW: 13.5 % (ref 11.5–15.5)
WBC: 6.7 K/uL (ref 4.0–10.5)
nRBC: 0 % (ref 0.0–0.2)

## 2024-01-15 MED ORDER — ONDANSETRON 4 MG PO TBDP: 4.0000 mg | ORAL_TABLET | Freq: Three times a day (TID) | ORAL | 0 refills | Status: AC | PRN

## 2024-01-15 MED ORDER — DIAZEPAM 2 MG PO TABS
2.0000 mg | ORAL_TABLET | Freq: Once | ORAL | Status: AC
  Administered 2024-01-15: 2 mg via ORAL
  Filled 2024-01-15: qty 1

## 2024-01-15 NOTE — ED Provider Notes (Signed)
 [place] EMERGENCY DEPARTMENT AT [facility] Provider Note   CSN: [id number] Arrival date & time: 01/15/24  1307     Patient presents with: Dizziness, Weakness, and Headache   [name] is a 78 y.o. female.   Patient here for MRI, sent by her primary care doctor for expedited MRI.  She has been dealing with dizziness on and off now the last 2 weeks.  Generalized weakness.  She has had somewhat chronic headache the last for 5 months.  She had an MRI several months ago for the same which was unremarkable.  She denies any weakness numbness tingling.  No speech changes or vision changes.  She started meclizine .  She supposed to follow-up with ENT.  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  The history is provided by the patient.       Prior to Admission medications   Medication Sig Start Date End Date Taking? Authorizing Provider  ondansetron  (ZOFRAN -ODT) 4 MG disintegrating tablet Take 1 tablet (4 mg total) by mouth every 8 (eight) hours as needed for nausea or vomiting. 01/15/24  Yes Ignacio Lowder, DO  acetaminophen  (TYLENOL ) 500 MG tablet Take 500 mg by mouth every 6 (six) hours as needed (pain.).    [provider]  amiodarone  (PACERONE ) 200 MG tablet Take 200 mg by mouth daily.    [provider]  aspirin  EC 81 MG tablet Take 1 tablet (81 mg total) by mouth daily. Swallow whole. 12/30/23   Pietro Redell RAMAN, MD  calcium  carbonate (OS-CAL) 1250 (500 Ca) MG chewable tablet Chew 1 tablet by mouth every evening.    [provider]  Calcium  Carbonate Antacid (TUMS CHEWY BITES PO) Take 1 tablet by mouth daily as needed (reflux).     [provider]  Carboxymethylcellul-Glycerin  (LUBRICATING EYE DROPS OP) Place 1 drop into both eyes 3 (three) times daily as needed (dry/irritated eys.).    [provider]  cephALEXin  (KEFLEX ) 500 MG capsule Take 2 grams (4  tablets) by mouth 1 hour prior to dental cleaning/procedure. 03/27/22   Hobart Powell BRAVO, MD  Cholecalciferol (VITAMIN D -3 PO) Take 1 tablet by mouth every evening.    [provider]  clonazePAM  (KLONOPIN ) 0.5 MG tablet Take 1 tablet by mouth twice daily as needed for anxiety Patient taking differently: Take 0.125-0.25 mg by mouth 2 (two) times daily as needed for anxiety. 01/26/23   Tabori, Katherine E, MD  cyanocobalamin  (VITAMIN B12) 1000 MCG/ML injection Inject 1,000 mcg into the muscle every 30 (thirty) days. 07/09/23   [provider]  denosumab  (PROLIA ) 60 MG/ML SOSY injection Inject 60 mg into the skin every 6 (six) months.    [provider]  furosemide  (LASIX ) 20 MG tablet Take 20 mg by mouth every Tuesday, Thursday, and Saturday at 6 PM. Per patient taking 60 mg on these days    [provider]  furosemide  (LASIX ) 40 MG tablet Take 1 tablet (40 mg total) by mouth daily. 09/23/23 01/12/24  Pietro Redell RAMAN, MD  levalbuterol  (XOPENEX  HFA) 45 MCG/ACT inhaler Inhale 2 puffs into the lungs every 4 (four) hours as needed for wheezing. 10/26/23 10/25/24  Kara Dorn NOVAK, MD  levothyroxine  (SYNTHROID ) 75 MCG tablet Take 1 tablet by mouth once daily 10/26/23   Tabori, Katherine E, MD  loratadine (CLARITIN) 10 MG tablet Take 10 mg by mouth in the morning.    [provider]  meclizine  (ANTIVERT )  25 MG tablet TAKE 1 TABLET BY MOUTH THREE TIMES DAILY AS NEEDED FOR DIZZINESS 12/31/23   Tabori, Katherine E, MD  metoprolol  tartrate (LOPRESSOR ) 25 MG tablet Take 1 tablet by mouth twice daily 10/07/23   Pietro Redell RAMAN, MD  mupirocin ointment (BACTROBAN) 2 % Apply 1 Application topically daily. 08/14/22   [provider]  nystatin  (MYCOSTATIN /NYSTOP ) powder Apply topically 2 (two) times daily as needed (skin irritation (under breasts)). 02/08/20   Love, Sharlet RAMAN, PA-C  ondansetron  (ZOFRAN ) 8 MG tablet Take 1 tablet (8 mg total) by mouth every 8 (eight) hours  as needed for nausea or vomiting. 07/24/23   Tabori, Katherine E, MD  oxyCODONE  (ROXICODONE ) 5 MG/5ML solution Take 4 mLs (4 mg total) by mouth every 4 (four) hours as needed for moderate pain. 02/08/20   Love, Sharlet RAMAN, PA-C  pantoprazole  (PROTONIX ) 40 MG tablet Take 1 tablet by mouth once daily 05/29/23   Dewald, Jonathan B, MD  polyethylene glycol (MIRALAX  / GLYCOLAX ) packet Take 17 g by mouth daily as needed for mild constipation.    [provider]  potassium chloride  SA (KLOR-CON  M) 20 MEQ tablet Take 1 tablet (20 mEq total) by mouth 3 (three) times a week. Take on Tuesdays, Thursdays and Saturdays when you take Lasix  60 mg. 12/04/23   West, Katlyn D, NP  revefenacin  (YUPELRI ) 175 MCG/3ML nebulizer solution Inhale one vial in nebulizer once daily. Do not mix with other nebulized medications. 11/18/23   Kara Dorn NOVAK, MD  sodium chloride  (OCEAN) 0.65 % SOLN nasal spray Place 1 spray into both nostrils as needed for congestion (nose bleeds).    [provider]  Ubrogepant (UBRELVY) 100 MG TABS Take 25 mg by mouth daily as needed (headaches/migraines).    [provider]  valACYclovir  (VALTREX ) 500 MG tablet Take 500 mg by mouth daily as needed (breakouts). 07/15/19   [provider]    Allergies: Buprenorphine, Erythromycin, Iodinated contrast media, Latex, Other, Tape, Ciprofloxacin , Duloxetine  hcl, Gabapentin , Hydrocodone, Hydromorphone , Iodine, Levofloxacin, Metrizamide, Nsaids, Nucynta [tapentadol hcl], Oxycodone , Pentazocine, Pregabalin, Sulfasalazine, Morphine, Sulfa antibiotics, and Sulfamethoxazole-trimethoprim    Review of Systems  Updated Vital Signs BP (!) 154/64   Pulse 60   Temp 98 F (36.7 C)   Resp (!) 22   Ht 5' 6 (1.676 m)   Wt 49.4 kg   SpO2 100%   BMI 17.59 kg/m   Physical Exam Vitals and nursing note reviewed.  Constitutional:      General: She is not in acute distress.    Appearance: She is well-developed. She is not  ill-appearing.  HENT:     Head: Normocephalic and atraumatic.     Mouth/Throat:     Mouth: Mucous membranes are moist.  Eyes:     General: No visual field deficit.    Extraocular Movements: Extraocular movements intact.     Right eye: Normal extraocular motion and no nystagmus.     Left eye: Normal extraocular motion and no nystagmus.     Conjunctiva/sclera: Conjunctivae normal.     Pupils: Pupils are equal, round, and reactive to light.     Right eye: Pupil is reactive.     Left eye: Pupil is reactive.  Cardiovascular:     Rate and Rhythm: Normal rate and regular rhythm.     Heart sounds: No murmur heard. Pulmonary:     Effort: Pulmonary effort is normal. No respiratory distress.     Breath sounds: Normal breath sounds.  Abdominal:  General: Bowel sounds are normal.     Palpations: Abdomen is soft.     Tenderness: There is no abdominal tenderness.  Musculoskeletal:        General: No swelling. Normal range of motion.     Cervical back: Normal range of motion and neck supple. No rigidity.  Lymphadenopathy:     Cervical: No cervical adenopathy.  Skin:    General: Skin is warm and dry.     Capillary Refill: Capillary refill takes less than 2 seconds.  Neurological:     Mental Status: She is alert and oriented to person, place, and time.     Cranial Nerves: No cranial nerve deficit, dysarthria or facial asymmetry.     Sensory: No sensory deficit.     Motor: No weakness.     Coordination: Romberg sign negative. Coordination normal.     Comments: 5+ out of 5 strength throughout, normal coordination, fairly steady gait, normal speech normal visual fields no nystagmus  Psychiatric:        Mood and Affect: Mood normal.     (all labs ordered are listed, but only abnormal results are displayed) Labs Reviewed  COMPREHENSIVE METABOLIC PANEL WITH GFR - Abnormal; Notable for the following components:      Result Value   Glucose, Bld 101 (*)    Calcium  8.8 (*)    AST 42 (*)     GFR, Estimated 59 (*)    All other components within normal limits  URINALYSIS, ROUTINE W REFLEX MICROSCOPIC - Abnormal; Notable for the following components:   Color, Urine STRAW (*)    All other components within normal limits  CBC    EKG: None  Radiology: MR BRAIN WO CONTRAST Result Date: 01/15/2024 CLINICAL DATA:  Neuro deficit, acute, stroke suspected EXAM: MRI HEAD WITHOUT CONTRAST TECHNIQUE: Multiplanar, multiecho pulse sequences of the brain and surrounding structures were obtained without intravenous contrast. COMPARISON:  CT of the head dated January 15, 2024. MRI of the brain dated September 09, 2023. FINDINGS: Brain: No restricted diffusion to indicate acute or recent infarction. There is mild to moderate periventricular and deep cerebral white matter disease. Chronic lacunar infarcts within the right caudate and right corona radiata. No evidence of hemorrhage, mass or hydrocephalus. Vascular: Normal vascular flow voids. Skull and upper cervical spine: Normal marrow signal. Sinuses/Orbits: No acute process. Other: None. IMPRESSION: 1. 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 No apparent acute process. Electronically Signed   By: Evalene Coho M.D.   On: 01/15/2024 18:26   CT Head Wo Contrast Result Date: 01/15/2024 CLINICAL DATA:  Headache, increasing frequency or severity. EXAM: CT HEAD WITHOUT CONTRAST TECHNIQUE: Contiguous axial images were obtained from the base of the skull through the vertex without intravenous contrast. RADIATION DOSE REDUCTION: This exam was performed according to the departmental dose-optimization program which includes automated exposure control, adjustment of the mA and/or kV according to patient size and/or use of iterative reconstruction technique. COMPARISON:  CT head 09/07/2023. FINDINGS: Brain: No acute intracranial hemorrhage. No CT evidence of acute infarct. Nonspecific hypoattenuation in the periventricular and subcortical white matter  favored to reflect chronic microvascular ischemic changes. Remote infarcts in the right caudate and right corona radiata. No edema, mass effect, or midline shift. The basilar cisterns are patent. Ventricles: The ventricles are normal. Vascular: No hyperdense vessel or unexpected calcification. Skull: No acute or aggressive finding. Orbits: Orbits are symmetric. Sinuses: Mucosal thickening in the right ethmoid sinus. Other: Trace fluid in the left mastoid  tip. IMPRESSION: No CT evidence of acute intracranial abnormality. Electronically Signed   By: Donnice Mania M.D.   On: 01/15/2024 15:52     Procedures   Medications Ordered in the ED  diazepam  (VALIUM ) tablet 2 mg (2 mg Oral Given 01/15/24 1527)                                    Medical Decision Making Amount and/or Complexity of Data Reviewed Labs: ordered. Radiology: ordered.  Risk Prescription drug management.   ANABELLA CAPSHAW is here for evaluation of dizziness.  She has basically had on and off dizziness for the last 2 weeks she has been on meclizine .  History of BPH.  Supposed to follow-up with ENT.  She is also been dealing with headaches on and off for the last 4 or 5 months.  Check showed an MRI several months ago for the same which was unremarkable.  I was able to review this report from the [facility].  She overall has a normal exam.  She is a little bit unsteady when she ambulates but she is able to do this.  She has no nystagmus.  No coordination issues.  Normal speech.  Overall well-appearing.  She does not quite have a headache today.  Her main concern is her dizziness today.  She has a history of mitral valve repair, anxiety, reflux otherwise.  She is not endorsing any chest pain shortness of breath.  She was supposed to have an outpatient MRI later this week but was sent today with symptoms somewhat worse than normal.  Ultimately we will give her some Valium  to see if that gives her any supportive care.  She is fairly  minimally symptomatic on my exam.  I will check basic labs.  EKG shows sinus rhythm.  No ischemic changes.  Ultimately will get CT head MRI basic labs.  Differential diagnosis likely migraine/benign positional vertigo.  I have no concern at this time for thrombosis or aneurysm or dissection.  She is not endorsing any thunderclap symptoms.  She is not describing the worst headache of her life.  Overall her symptoms are somewhat subacute/chronic but will evaluate to see if there is any evidence of acute stroke or recent stroke.  Lab work per my review interpretation is unremarkable.  No significant leukocytosis anemia or electrolyte abnormality.  MRI of the brain is unremarkable.  CT of the head is unremarkable.  Overall she is feeling better.  She is ambulatory.  This seems like somewhat of a chronic issue that she has been dealing with.  She has follow-up with ear nose and throat doctor arranged.  I will have her follow-up with neurology.  Will have her follow-up with her primary care doctor as well.  Discharged.  Will prescribe Zofran  as needed for nausea.  Can continue meclizine .  This chart was dictated using voice recognition software.  Despite best efforts to proofread,  errors can occur which can change the documentation meaning.      Final diagnoses:  Dizziness  Other migraine without status migrainosus, not intractable    ED Discharge Orders          Ordered    Ambulatory referral to Neurology       Comments: An appointment is requested in approximately: 1 week   01/15/24 1928    ondansetron  (ZOFRAN -ODT) 4 MG disintegrating tablet  Every 8 hours PRN  01/15/24 1928               Ruthe Cornet, DO 01/15/24 1929

## 2024-01-15 NOTE — Discharge Instructions (Signed)
 Follow-up with your ear nose and throat doctor.  Follow-up with neurology.  Follow-up with your primary care doctor.  Return if symptoms worsen.

## 2024-01-15 NOTE — ED Triage Notes (Addendum)
 Pt to ED via POV c/o generalized weakness, dizziness, HA x 1 week progressively getting worse, evaluated at doctors office yesterday for the same.

## 2024-01-15 NOTE — Telephone Encounter (Signed)
 Patient complains of worse sxs then yesterday and wants to know what to do

## 2024-01-15 NOTE — Telephone Encounter (Signed)
 If symptoms are worsening, and patient is struggling at home, I recommend she go to the emergency department for an expedited evaluation of acute vestibular syndrome.

## 2024-01-15 NOTE — Telephone Encounter (Signed)
 Pt is going to go to [facility] ER pt has been informed and will go over there for eval

## 2024-01-15 NOTE — Telephone Encounter (Signed)
 FYI Only or Action Required?: Action required by provider: clinical question for provider and update on patient condition.  Patient was last seen in primary care on 01/14/2024 by [name], MD.  Called Nurse Triage reporting Headache and Nausea.  Symptoms began Ongoing.  Interventions attempted: Rest, hydration, or home remedies.  Symptoms are: gradually worsening.  Triage Disposition: Call PCP Now  Patient/caregiver understands and will follow disposition?: No, wishes to speak with PCP  Copied from CRM [id number]. Topic: Clinical - Red Word Triage >> Jan 15, 2024  9:07 AM Franky GRADE wrote: Red Word that prompted transfer to Nurse Triage: patient was seen yesterday and an MRI was ordered for a possible stroke, her symptoms of nausea and headaches have gotten worse this morning. Reason for Disposition  [1] Follow-up call from patient regarding patient's clinical status AND [2] information urgent    Symptoms worsening  Answer Assessment - Initial Assessment Questions 1  Answer Assessment - Initial Assessment Questions 1. REASON FOR CALL or QUESTION: What is your reason for calling today? or How can I best Calling to provide update on her symptoms after OV yesterday. She is also inquiring if MRI can be done today or when she will be called to schedule.   Update:       Oxy and tylenol  with minimal effect, however leg pain has improved. Nausea is worse than yesterday, but not vomiting. Dizziness unchanged. Headache not resolved. Feels worse today than yesterday. Nausea is the most bothersome to her. She did not take any doses of klonopin  or Yupelri  as advised.  She would like to know when MRI will be done, this Clinical research associate spoke with [name] via CAL who informed this writer MRI can take up to one week and patient will receive a call to schedule.   Requesting call back today from provider in office for recommendations. She is worried she will be left to wait throughout the  weekend.  Protocols used: Headache-A-AH, PCP Call - No Triage-A-AH

## 2024-01-17 ENCOUNTER — Other Ambulatory Visit

## 2024-01-18 ENCOUNTER — Telehealth: Payer: Self-pay | Admitting: Family Medicine

## 2024-01-18 NOTE — Telephone Encounter (Signed)
 Caller name: WANDY BOSSLER  On DPR?: Yes  Call back number: [phone] (mobile)  Provider they see: [name], MD  Reason for call: Patient called triage over the weekend. She has been stressed out by the health issues and her hair is starting to fall out. She wants to know if she could have a half of her medication Klonopin  because it helps even her out during this time. She was seen in ED on 7/11 for dizziness and HA. She was referred to ENT & Neuro for these symptoms.

## 2024-01-18 NOTE — Telephone Encounter (Signed)
 Pt has been scheduled to discuss anxiety (office visit)

## 2024-01-19 ENCOUNTER — Other Ambulatory Visit (INDEPENDENT_AMBULATORY_CARE_PROVIDER_SITE_OTHER)

## 2024-01-19 ENCOUNTER — Other Ambulatory Visit: Payer: Self-pay

## 2024-01-19 ENCOUNTER — Ambulatory Visit (INDEPENDENT_AMBULATORY_CARE_PROVIDER_SITE_OTHER): Admitting: Student in an Organized Health Care Education/Training Program

## 2024-01-19 VITALS — BP 123/42 | HR 54 | Ht 66.0 in | Wt 110.4 lb

## 2024-01-19 DIAGNOSIS — E039 Hypothyroidism, unspecified: Secondary | ICD-10-CM

## 2024-01-19 DIAGNOSIS — F419 Anxiety disorder, unspecified: Secondary | ICD-10-CM

## 2024-01-19 DIAGNOSIS — F32A Depression, unspecified: Secondary | ICD-10-CM

## 2024-01-19 DIAGNOSIS — R42 Dizziness and giddiness: Secondary | ICD-10-CM | POA: Diagnosis not present

## 2024-01-19 LAB — TSH: TSH: 1.59 u[IU]/mL (ref 0.35–5.50)

## 2024-01-19 NOTE — Progress Notes (Signed)
 Established Patient Office Visit  Subjective   Patient ID: [name], female    DOB: 08-Feb-1946  Age: 78 y.o. MRN: [id number]  Chief Complaint  Patient presents with   Anxiety    Was in the emergency room Friday for dizziness and HA. Pt reports she has been anxious since Friday and sx have gotten worse with her legs    HPI  Discussed the use of AI scribe software for clinical note transcription with the patient, who gave verbal consent to proceed.  History of Present Illness [name] is a 78 year old female who presents with worsening dizziness, blackouts, and weakness. She is accompanied by her sister, Winton.  She experiences worsening dizziness, blackouts, and weakness, which have significantly deteriorated since her last visit. She recently visited the emergency department where an MRI of her head, blood work, and an EKG were performed; she was told that it was not a stroke. Despite this, she continues to experience severe dizziness, which affects her vision and causes a 'dizzy feeling.'  She experiences blackouts, described as 'everything shuts down' and she loses memory. These episodes have occurred three times, with the first incident resulting in her being partially on the floor. She has not fully fallen to the ground during the last two episodes but describes a sensation akin to 'dying and coming back.'  She reports significant weakness, particularly in her legs, making it difficult to walk even with a walker. Her legs feel heavy and ache, and she describes her arms and hands as weak, with tremors that prevent her from holding objects steadily. She has a history of multiple surgeries, including neck and back surgeries, which have left her with neck stiffness and immobility.  She experiences severe headaches and has been taking Tylenol  and oxycodone  for relief. She takes oxycodone  four to five times a day and has been on it for eight to nine years. She also takes clonazepam ,  which was prescribed by a counselor, but is concerned about the interaction with oxycodone . She has attempted to reduce her oxycodone  dosage but is unsure if this has contributed to her symptoms.  She lives alone and has difficulty managing daily activities due to her symptoms. She has a limited support system, with her son currently hospitalized and her sister living in a different city. She is concerned about her ability to live independently and has considered moving but finds it financially unfeasible. She is anxious about her situation and has a history of anxiety, for which she has taken clonazepam .      Objective:     BP (!) 123/42 (Cuff Size: Normal)   Pulse (!) 54   Ht 5' 6 (1.676 m)   Wt 110 lb 6.4 oz (50.1 kg)   BMI 17.82 kg/m   Physical Exam  Gen: Very frail appearing older woman Psych: Anxious and tearful at times, organized speech Neuro: Alert, conversational, delayed get up and go, needs assistance getting up onto the table, full strength upper and lower extremities, very limited range of motion due to multiple spinal surgeries Heart: Regular, no murmur Lungs: Unlabored, clear throughout    Assessment & Plan:   Problem List Items Addressed This Visit       Unprioritized   Anxiety and depression   Significant anxiety with previous clonazepam  use. High fall risk due to frailty and living alone.  I declined to refill clonazepam , I think the risks of that medication outweigh the benefits given her current situation at  home, high fall risk, no support.      Dizziness - Primary   Worsening dizziness and blackouts with memory loss and disorientation. Stroke excluded by recent MRI. Multifactorial etiology likely related to chronic conditions and polypharmacy.  She has follow-up planned with neurology and ENT.  I think the etiology is likely multifactorial from her chronic comorbidity as well as her severe frailty from advanced age.  Ultimately I think that she is  struggling at home by herself.  I think there is some safety risk with her being home by herself.  I urged her to get a life alert and to reach out for more help from her family.  I am referring her to [facility] to consider personal care services.  I urged her to consider moving to a more monitored setting like a rehabilitation center.  She declined further physical therapy.  I warned her that I do not think there is a medication to solve these problems, but I think this is a decline in functional status that is going to require a lot more support at home, which she currently does not have.      Relevant Orders   AMB Referral [facility] Care Management   Hypothyroid   Relevant Orders   TSH    I spent 35 minutes with the patient conducting history taking, physical examination, and medical decision making. This time was medically necessary given the complexity of the patient's concerns and conditions.     Return in about 2 weeks (around 02/02/2024).    Cleatus Debby Specking, MD

## 2024-01-19 NOTE — Assessment & Plan Note (Signed)
 Significant anxiety with previous clonazepam  use. High fall risk due to frailty and living alone.  I declined to refill clonazepam , I think the risks of that medication outweigh the benefits given her current situation at home, high fall risk, no support.

## 2024-01-19 NOTE — Addendum Note (Signed)
 Addended by: SHARA BASCOM RAMAN on: 01/19/2024 03:20 PM   Modules accepted: Orders

## 2024-01-19 NOTE — Patient Instructions (Signed)
  VISIT SUMMARY: Today, you were seen for worsening dizziness, blackouts, and weakness. We discussed your recent emergency department visit, where tests ruled out a stroke. You continue to experience severe dizziness, blackouts, and significant weakness, particularly in your legs. We also reviewed your chronic pain management and anxiety, as well as your concerns about living independently.  YOUR PLAN: -DIZZINESS AND BLACKOUTS: Your dizziness and blackouts are likely due to multiple factors, including your chronic conditions and medications. We will refer you to a neurologist and an ENT specialist for further evaluation, and we will also check your thyroid  function with a blood test.  -CHRONIC PAIN AND OXYCODONE  USE: You have been using oxycodone  for a long time to manage your chronic pain. We will keep your current dosage for now but will consult a pain management specialist to help create a plan to reduce your use safely.  -ANXIETY: You have significant anxiety and have been using clonazepam . We discussed the risks associated with this medication, especially given your high risk of falls.  -FRAILTY AND FALL RISK: You are at a high risk of falling due to your frailty and leg weakness. We will refer you to case management to explore home support options and discuss getting a life alert system for your safety.  -VISION CHANGES: Your vision changes may be related to your headaches and medications. Cataract surgery is not recommended at this time.  -GENERAL HEALTH MAINTENANCE: We discussed your concerns about affording care on a fixed income and advised you to consider Medicaid. We will refer you to a social worker to assist with this process.  INSTRUCTIONS: Please follow up in 1-2 weeks for close monitoring of your condition. We will also arrange for you to see a neurologist, an ENT specialist, and a pain management specialist. Additionally, we will conduct thyroid  function tests and refer you to case  management and a social worker for further support.

## 2024-01-19 NOTE — Assessment & Plan Note (Signed)
 Worsening dizziness and blackouts with memory loss and disorientation. Stroke excluded by recent MRI. Multifactorial etiology likely related to chronic conditions and polypharmacy.  She has follow-up planned with neurology and ENT.  I think the etiology is likely multifactorial from her chronic comorbidity as well as her severe frailty from advanced age.  Ultimately I think that she is struggling at home by herself.  I think there is some safety risk with her being home by herself.  I urged her to get a life alert and to reach out for more help from her family.  I am referring her to [facility] to consider personal care services.  I urged her to consider moving to a more monitored setting like a rehabilitation center.  She declined further physical therapy.  I warned her that I do not think there is a medication to solve these problems, but I think this is a decline in functional status that is going to require a lot more support at home, which she currently does not have.

## 2024-01-20 ENCOUNTER — Ambulatory Visit: Payer: Self-pay | Admitting: Student in an Organized Health Care Education/Training Program

## 2024-01-20 ENCOUNTER — Telehealth: Payer: Self-pay

## 2024-01-20 NOTE — Progress Notes (Unsigned)
 Complex Care Management Note Care Guide Note  01/20/2024 Name: [name] MRN: [id number] DOB: 01/24/1946   Complex Care Management Outreach Attempts: An unsuccessful telephone outreach was attempted today to offer the patient information about available complex care management services.  Follow Up Plan:  Additional outreach attempts will be made to offer the patient complex care management information and services.   Encounter Outcome:  Patient Request to Call Back  [facility]  [facility], [facility] Health Care Management Assistant Direct Dial: [phone]  Fax: [phone]

## 2024-01-22 NOTE — Telephone Encounter (Signed)
 Copied from CRM [id number]. Topic: General - Call Back - No Documentation >> Jan 22, 2024 12:54 PM [name] wrote:   Reason for CRM: Patient states she is following up on a missed call requesting call back from Social worker to [phone].

## 2024-01-22 NOTE — Progress Notes (Signed)
 Complex Care Management Note  Care Guide Note 01/22/2024 Name: ZIOMARA BIRENBAUM MRN: [id number] DOB: Jan 08, 1946  GLADIES SOFRANKO is a 78 y.o. year old female who sees Tabori, Comer BRAVO, MD for primary care. I reached out to Odetta JINNY Brought by phone today to offer complex care management services.  Ms. Pancoast was given information about Complex Care Management services today including:   The Complex Care Management services include support from the care team which includes your Nurse Care Manager, Clinical Social Worker, or Pharmacist.  The Complex Care Management team is here to help remove barriers to the health concerns and goals most important to you. Complex Care Management services are voluntary, and the patient may decline or stop services at any time by request to their care team member.   Complex Care Management Consent Status: Patient agreed to services and verbal consent obtained.   Follow up plan:  Telephone appointment with complex care management team member scheduled for:  02/02/24 at 2:30 p.m.   Encounter Outcome:  Patient Scheduled  [facility]  [facility], [facility] Health Care Management Assistant Direct Dial: [phone]  Fax: [phone]

## 2024-01-22 NOTE — Telephone Encounter (Signed)
 Patient is asking if you recommend anything else for her as she cannot function with her current sx you guys discussed at her visit.

## 2024-01-25 NOTE — Telephone Encounter (Signed)
 Patient verbalized understanding and will see you at her appointment on Thursday.

## 2024-01-25 NOTE — Telephone Encounter (Signed)
 Referrals are in, no easy solutions for her.  I think case management help through [facility] will be the most valuable.  I will talk with the patient more about this at her next visit.

## 2024-01-27 ENCOUNTER — Telehealth: Payer: Self-pay | Admitting: Family Medicine

## 2024-01-27 NOTE — Telephone Encounter (Signed)
 Obtained forms from front desk and placed in Dr. Charis folder for review and sign.

## 2024-01-27 NOTE — Telephone Encounter (Signed)
 Type of form received: Palliative Orders  Additional comments:   Received by: Fax  Form should be Faxed/mailed to: (address/ fax #) [phone]  Is patient requesting call for pickup: N/A  Form placed:  Labeled & placed in provider bin  Attach charge sheet.  Provider will determine charge.  Individual made aware of 3-5 business day turn around? N/A

## 2024-01-28 ENCOUNTER — Ambulatory Visit: Admitting: Student in an Organized Health Care Education/Training Program

## 2024-01-28 ENCOUNTER — Encounter: Payer: Self-pay | Admitting: Student in an Organized Health Care Education/Training Program

## 2024-01-28 VITALS — BP 134/66 | HR 87 | Wt 106.0 lb

## 2024-01-28 DIAGNOSIS — F419 Anxiety disorder, unspecified: Secondary | ICD-10-CM

## 2024-01-28 DIAGNOSIS — E785 Hyperlipidemia, unspecified: Secondary | ICD-10-CM | POA: Diagnosis not present

## 2024-01-28 DIAGNOSIS — F32A Depression, unspecified: Secondary | ICD-10-CM | POA: Diagnosis not present

## 2024-01-28 MED ORDER — LEVOTHYROXINE SODIUM 75 MCG PO TABS: 75.0000 ug | ORAL_TABLET | Freq: Every day | ORAL | 0 refills | Status: DC

## 2024-01-28 MED ORDER — CLONAZEPAM 0.5 MG PO TABS: 0.2500 mg | ORAL_TABLET | Freq: Two times a day (BID) | ORAL | 0 refills | Status: DC | PRN

## 2024-01-28 NOTE — Assessment & Plan Note (Signed)
 Chronic problem for many years.  Previously managed with a psychiatrist, was using Klonopin  up until late April.  She experiences chronic anxiety with palpitations and chest pressure. After discussing the risks of Klonopin , we decided to restart it due to her son's presence.  Patient clearly is a high fall risk, she understands this.  Has been using palliative care services in the past but graduated from that program a few months ago.  I wonder if losing the palliative care services may have contributed to her recent functional decline.  Prescribe Klonopin  0.5 mg, take half a tablet as needed, up to twice daily. Schedule a follow-up in four weeks to assess anxiety management.  She is working with case management through [facility], I hope is that she could apply for Medicaid and ultimately personal care services.  I think she needs more support at home to improve her quality of life and safety.

## 2024-01-28 NOTE — Progress Notes (Signed)
 Established Patient Office Visit  Subjective   Patient ID: [name], female    DOB: 15-Mar-1946  Age: 78 y.o. MRN: [id number]  Chief Complaint  Patient presents with   Medical Management of Chronic Issues    2 week follow up     HPI  Discussed the use of AI scribe software for clinical note transcription with the patient, who gave verbal consent to proceed.  History of Present Illness [name] is a 78 year old female who presents for follow-up regarding her ongoing symptoms of dizziness and anxiety.  She experiences persistent dizziness and headaches, which have been ongoing for an extended period. She has an upcoming appointment with a neurologist. Although she has not experienced any recent falls, she remains cautious and avoids going outside alone due to fear of falling. She has not driven her car in over two weeks due to these symptoms.  She reports significant back pain extending from her neck to her pelvic area, which affects her ability to relax and maintain posture while walking. This pain is accompanied by anxiety, exacerbating her discomfort. She uses a cane for mobility assistance.  Her anxiety is severe, causing sensations of breathlessness and heaviness on her chest. She previously used Klonopin  for anxiety, prescribed as needed for episodes of palpitations or severe anxiety, but has concerns about dependency. She also uses oxycodone  for pain management, though she feels it may not be as effective as before.  She has been in palliative care for approximately two years, which was recently discontinued. During this time, she received home visits and monitoring, but these services have ceased. She wants to remain in her home and is exploring options for in-home assistance.  Her son, who was recently hospitalized, is currently staying with her, providing some company and support, although he is still recovering and not fully able to assist her.  She has been taking her  prescribed medications, including fluid pills to manage edema, and is due for a refill of her current prescriptions.      Objective:     BP 134/66   Pulse 87   Wt 106 lb (48.1 kg)   SpO2 94%   BMI 17.11 kg/m    Physical Exam  Gen: Frail-appearing older woman Heart: Regular, no murmur Lungs: Unlabored clear Ext: Modestly cool to the touch, no edema Psych: Appropriate mood and affect, less anxious appearing today, normal speech, not depressed appearing Neuro: Alert, conversational, diffusely sarcopenia,, very frail appearing, full strength in the upper and lower extremities but very delayed get up and go, uses a walking cane for assistance    Assessment & Plan:    Problem List Items Addressed This Visit       Unprioritized   Anxiety and depression - Primary   Chronic problem for many years.  Previously managed with a psychiatrist, was using Klonopin  up until late April.  She experiences chronic anxiety with palpitations and chest pressure. After discussing the risks of Klonopin , we decided to restart it due to her son's presence.  Patient clearly is a high fall risk, she understands this.  Has been using palliative care services in the past but graduated from that program a few months ago.  I wonder if losing the palliative care services may have contributed to her recent functional decline.  Prescribe Klonopin  0.5 mg, take half a tablet as needed, up to twice daily. Schedule a follow-up in four weeks to assess anxiety management.  She is working with  case management through [facility], I hope is that she could apply for Medicaid and ultimately personal care services.  I think she needs more support at home to improve her quality of life and safety.      Relevant Medications   clonazePAM  (KLONOPIN ) 0.5 MG tablet   Hyperlipidemia   Relevant Medications   levothyroxine  (SYNTHROID ) 75 MCG tablet   I spent 35 minutes with the patient conducting history taking, physical examination, and  medical decision making. This time was medically necessary given the complexity of the patient's concerns and conditions.    Return in about 4 weeks (around 02/25/2024).    Cleatus Debby Specking, MD

## 2024-01-28 NOTE — Telephone Encounter (Signed)
 Patient came in today for a visit. FYI

## 2024-01-28 NOTE — Patient Instructions (Addendum)
  VISIT SUMMARY: Today, you were seen for follow-up regarding your ongoing symptoms of dizziness, anxiety, and chronic pain. We discussed your current condition, medications, and plans for further management.  YOUR PLAN: -DIZZINESS: You have been experiencing chronic dizziness and headaches, which are affecting your daily activities. You have an upcoming appointment with a neurologist to further investigate these symptoms.  -ANXIETY: You have chronic anxiety that causes palpitations and chest pressure. We discussed the risks of Klonopin , and you agreed to restart it due to your son's presence. You should take Klonopin  0.5 mg, half a tablet as needed, up to twice daily. We will follow up in four weeks to see how you are managing.  -CHRONIC PAIN: You have chronic back pain from your neck to your pelvis, likely related to previous spinal surgeries. This pain affects your mobility. You should continue with your current pain management regimen.  -GOALS OF CARE: You expressed concerns about your quality of life and desire for independence. We discussed applying for Medicaid to get personal care services at home. Our case managers will assist you with this process.  INSTRUCTIONS: A follow-up call with case managers is scheduled for Tuesday to discuss your Medicaid application and personal care services. Please schedule a follow-up appointment in four weeks with either the current provider or Dr. Mahlon.

## 2024-01-28 NOTE — Telephone Encounter (Signed)
 This form was outdated. Patient was discharged from palliative services months ago.

## 2024-01-28 NOTE — Telephone Encounter (Addendum)
 Form reviewed regarding blood pressure and heart rate averages, also notation of issues with nosebleeds.  Platelets were normal on recent CBC.  Recommend she try saline nasal spray, if that is not helping, please follow-up in office for exam.  Thanks

## 2024-01-31 NOTE — Progress Notes (Unsigned)
 Cardiology Office Note    Date:  02/01/2024  ID:  Bowen, Goyal Dec 03, 1945, MRN [id number] PCP:  [name], MD  Cardiologist:  Redell Shallow, MD  Electrophysiologist:  Soyla Gladis Norton, MD   Chief Complaint: Follow up for atrial fibrillation   History of Present Illness: .    BOSTON CATARINO is a 78 y.o. female with visit-pertinent history of chronic dyspnea, PAF, previous MV repair, coronary artery disease.  Patient had an echocardiogram in 07/2019 that showed EF 60 to 65%, no RWMA, normal RV systolic function, moderate mitral valve regurgitation with by leaflet prolapse.  She underwent TEE on 11/21/2019 that showed severe mitral valve regurgitation with a flail segment of the anterior leaflet.  Is recommended that she undergo mitral valve repair.  Prior to procedure patient had a right/left heart catheterization on 12/02/2019 that showed mild, nonobstructive CAD with 20% stenosis in the proximal RCA and distal LAD.  She was seen by Dr. Quin and underwent minimally invasive mitral valve repair, complete maze procedure and sternotomy with repair of left ventricular free wall laceration on 01/17/2020.  Postoperatively she had acute blood loss anemia requiring blood transfusions and A-fib with RVR for which she was placed on amiodarone  and Lopressor  and Tikosyn  was discontinued.  Later amiodarone  was discontinued and her A-fib has been managed with Eliquis  and metoprolol .  Patient has noted history of chronic dyspnea, echocardiogram from 02/2022 showed EF 50 to 55%, moderately reduced RV systolic function.  There was interventricular septal flattening in systole consistent with RV pressure overload.  Findings were consistent with normal structure and function of mitral valve prosthesis.  Follow-up chest CT in 02/2022 revealed right middle lobe and left upper lobe nodularity suggestive of atypical infection, nodule in the left major fissure.  She is followed by Dr. Kara with  pulmonology.  When seen by cardiology in 10/2022 patient reported frequent nosebleeds while on Eliquis .  She also noted pain in her calves and ankles, underwent ABIs in 11/2022 that were normal.  Echocardiogram on 01/27/2023 indicated LVEF of 55 to 60%, LV with normal function, mild LVH, into her ventricular septum is flattened in systole, consistent with right ventricular pressure overload.  RV systolic function moderately reduced, RV size moderately enlarged, moderately elevated pulmonary artery systolic pressure.  Mitral valve repair/replaced, trivial mitral valve regurgitation, mean gradient 2.0 mmHg with an average heart rate of 60 bpm.  Patient was seen in clinic on 07/07/2023.  At that time patient reported feelings of palpitations and elevated heart rate.  Restarting amiodarone  was discussed with patient preferred to continue current treatment and monitor symptoms.  She remained on the Toprol  tartrate 25 mg twice daily and Eliquis  5 mg twice daily.  Patient was last seen in clinic on 09/23/2023, she had [facility] by report was in recurrent atrial fibrillation.  Patient was seen by Dr. Shallow, noted to continue having dyspnea on exertion which was unchanged.  EKG indicated that she was back in atrial fibrillation, heart rate was mildly elevated and blood pressure was borderline.  She was started on amiodarone  200 mg twice a day for 2 weeks then 200 mg daily thereafter with recommendation to proceed with cardioversion in approximately 4 weeks if A-fib persisted.  Her Eliquis  was increased to 5 mg twice daily in anticipation of cardioversion.  On 10/09/2023 patient presented reporting shortness of breath, at office visit patient noted that this was a chronic and ongoing issue denied any significant changes.  She  reported that she had not been using her inhalers as a pharmacist instructed her not to use them, is recommended that she discuss with her pulmonologist.  On chart review  patient has presented to [facility] on 4/8 after her pulmonologist office recommended she present to the emergency department given increased shortness of breath, patient reported to the ED that she has chronic shortness of breath.  Patient workup was reassuring including troponins flat and within normal limits x 3.  Patient was discharged in stable condition.  On 10/20/23 she presented back to [facility] with complaints of a headache that had been ongoing for multiple months, she felt that it had worsened in the last 2 days, patient had undergone MRI on 09/09/2023 with no acute abnormalities.  Patient lab work was overall reassuring and patient noted improvement prior to discharge.   Patient was in clinic on 10/21/2023.  She remained in atrial fibrillation versus atrial tachycardia, was scheduled for cardioversion.  Patient underwent cardioversion on 10/27/23 that was successful.  Patient was seen in the atrial fibrillation clinic on 12/07/2023, had remained in normal sinus rhythm.  She was continued on amiodarone  200 mg daily.  She was to follow-up in 6 months for ongoing monitoring of amiodarone  therapy.  Patient was seen in clinic by Dr. Pietro on 12/30/2023 patient endorsed episodes of dizziness that could last all day, improved with lying down but occurred with sitting and standing.  She denied any syncope.  Noted to continue to have dyspnea.  Patient's Eliquis  was discontinued at that time and she was started on aspirin  81 mg daily.  Noted to have left atrial appendage clipping at time of her previous maze procedure.  On 01/07/2024 patient notified the office of weight gain of 4 pounds and swelling in her ankles.  Was scheduled for a follow-up visit with Dr. Pietro on 01/12/2024, patient was noted to not be significantly volume overloaded on exam recommended to continue diuretic at present dose and to take an additional 20 mg Lasix  daily as needed for weight gain of 2 to 3 pounds.  Today she presents for  follow-up.  She reports that she has been doing well.  She reports that her breathing is at baseline, continues to note atypical chest pain that is overall unchanged.  She reports she took klonopin  this morning with improvement in anxiety. Her son is living with her currently however is recuperating from a recent hospital stay as well. Notes this has resulted in increased stress. Continues to note frequent headaches and dizziness that has been present for months, will follow up with neurology on Thursday. Reports four days ago she had some improvement in dizziness and headache. Feels some symptoms may be inner ear, has follow up planned with ENT. Working on gaining weight. She notes she has not been using her nebulizer treatments as she was concerned it was causing her dizziness however has not noted change in symptoms with stopping of her nebulizer, plans to resume.   ROS: .   Today she denies lower extremity edema, fatigue, palpitations, melena, hematuria, hemoptysis, diaphoresis, weakness, presyncope, syncope, orthopnea, and PND.  All other systems are reviewed and otherwise negative. Studies Reviewed: SABRA   EKG:  EKG is not ordered today.  CV Studies: Cardiac studies reviewed are outlined and summarized above. Otherwise please see EMR for full report. Cardiac Studies & Procedures   ______________________________________________________________________________________________     ECHOCARDIOGRAM  ECHOCARDIOGRAM COMPLETE 01/27/2023  Narrative ECHOCARDIOGRAM REPORT    Patient Name:  Tranae J Friday Date of Exam: 01/27/2023 Medical Rec #:  [id number]   Height:       66.0 in Accession #:    [id number]  Weight:       114.2 lb Date of Birth:  1946-04-26   BSA:          1.576 m Patient Age:    77 years    BP:           100/60 mmHg Patient Gender: F           HR:           65 bpm. Exam Location:  Outpatient  Procedure: 2D Echo, Cardiac Doppler and Color Doppler  Indications:    S/P Mitral Valve  repair Z98.89  History:        Patient has prior history of Echocardiogram examinations, most recent 02/12/2022. Arrythmias:Atrial Fibrillation; Signs/Symptoms:Shortness of Breath.  Mitral Valve: 32 mm Sorin Memo 4D prosthetic annuloplasty ring valve is present in the mitral position. Procedure Date: 01/17/20.  Sonographer:    Lauraine Pilot RDCS Referring Phys: [id number] ROLLO JONELLE LOUDER   Sonographer Comments: Image acquisition challenging due to patient body habitus. IMPRESSIONS   1. Left ventricular ejection fraction, by estimation, is 55 to 60%. The left ventricle has normal function. Left ventricular endocardial border not optimally defined to evaluate regional wall motion. There is mild left ventricular hypertrophy. Left ventricular diastolic function could not be evaluated. There is the interventricular septum is flattened in systole, consistent with right ventricular pressure overload. 2. Right ventricular systolic function is moderately reduced. The right ventricular size is moderately enlarged. There is moderately elevated pulmonary artery systolic pressure. The estimated right ventricular systolic pressure is 48.9 mmHg. 3. Right atrial size was moderately dilated. 4. The mitral valve has been repaired/replaced. Trivial mitral valve regurgitation. The mean mitral valve gradient is 2.0 mmHg with average heart rate of 63 bpm. There is a 32 mm Sorin Memo 4D prosthetic annuloplasty ring present in the mitral position. Procedure Date: 01/17/20. Echo findings are consistent with normal structure and function of the mitral valve prosthesis. 5. Tricuspid valve regurgitation is moderate. 6. The aortic valve is tricuspid. There is mild calcification of the aortic valve. There is mild thickening of the aortic valve. Aortic valve regurgitation is mild. Aortic valve sclerosis is present, with no evidence of aortic valve stenosis. 7. Pulmonic valve regurgitation is moderate. 8. The inferior vena  cava is dilated in size with <50% respiratory variability, suggesting right atrial pressure of 15 mmHg.  FINDINGS Left Ventricle: Left ventricular ejection fraction, by estimation, is 55 to 60%. The left ventricle has normal function. Left ventricular endocardial border not optimally defined to evaluate regional wall motion. The left ventricular internal cavity size was normal in size. There is mild left ventricular hypertrophy. The interventricular septum is flattened in systole, consistent with right ventricular pressure overload. Left ventricular diastolic function could not be evaluated.  Right Ventricle: The right ventricular size is moderately enlarged. No increase in right ventricular wall thickness. Right ventricular systolic function is moderately reduced. There is moderately elevated pulmonary artery systolic pressure. The tricuspid regurgitant velocity is 2.91 m/s, and with an assumed right atrial pressure of 15 mmHg, the estimated right ventricular systolic pressure is 48.9 mmHg.  Left [facility]: Left atrial size was normal in size.  Right [facility]: Right atrial size was moderately dilated.  Pericardium: There is no evidence of pericardial effusion.  Mitral Valve: The mitral valve has been repaired/replaced. Trivial mitral valve  regurgitation. There is a 32 mm Sorin Memo 4D prosthetic annuloplasty ring present in the mitral position. Procedure Date: 01/17/20. Echo findings are consistent with normal structure and function of the mitral valve prosthesis. MV peak gradient, 8.8 mmHg. The mean mitral valve gradient is 2.0 mmHg with average heart rate of 63 bpm.  Tricuspid Valve: The tricuspid valve is normal in structure. Tricuspid valve regurgitation is moderate.  Aortic Valve: The aortic valve is tricuspid. There is mild calcification of the aortic valve. There is mild thickening of the aortic valve. Aortic valve regurgitation is mild. Aortic valve sclerosis is present, with no evidence of  aortic valve stenosis.  Pulmonic Valve: The pulmonic valve was normal in structure. Pulmonic valve regurgitation is moderate.  Aorta: The aortic root is normal in size and structure.  Venous: The inferior vena cava is dilated in size with less than 50% respiratory variability, suggesting right atrial pressure of 15 mmHg.  IAS/Shunts: The atrial septum is grossly normal.   LEFT VENTRICLE PLAX 2D LVIDd:         2.90 cm LVIDs:         2.10 cm LV PW:         1.10 cm LV IVS:        1.00 cm LVOT diam:     1.90 cm LV SV:         42 LV SV Index:   27 LVOT Area:     2.84 cm  LV Volumes (MOD) LV vol d, MOD A2C: 59.7 ml LV vol d, MOD A4C: 44.9 ml LV vol s, MOD A2C: 23.1 ml LV vol s, MOD A4C: 14.9 ml LV SV MOD A2C:     36.6 ml LV SV MOD A4C:     44.9 ml LV SV MOD BP:      33.8 ml  RIGHT VENTRICLE RV S prime:     8.99 cm/s TAPSE (M-mode): 1.5 cm  LEFT ATRIUM             Index        RIGHT ATRIUM           Index LA diam:        3.10 cm 1.97 cm/m   RA Area:     20.30 cm LA Vol (A2C):   36.9 ml 23.41 ml/m  RA Volume:   68.10 ml  43.21 ml/m LA Vol (A4C):   40.9 ml 25.95 ml/m LA Biplane Vol: 40.0 ml 25.38 ml/m AORTIC VALVE                   PULMONIC VALVE LVOT Vmax:         70.90 cm/s  PR End Diast Vel: 6.15 msec LVOT Vmean:        43.400 cm/s LVOT VTI:          0.149 m AR Vena Contracta: 0.30 cm  AORTA Ao Root diam: 3.00 cm Ao Asc diam:  3.00 cm  MITRAL VALVE              TRICUSPID VALVE MV Area (PHT): 2.89 cm   TR Peak grad:   33.9 mmHg MV Area VTI:   1.19 cm   TR Vmax:        291.00 cm/s MV Peak grad:  8.8 mmHg MV Mean grad:  2.0 mmHg   SHUNTS MV Vmax:       1.48 m/s   Systemic VTI:  0.15 m MV Vmean:      62.3  cm/s  Systemic Diam: 1.90 cm  Soyla Merck MD Electronically signed by Soyla Merck MD Signature Date/Time: 01/28/2023/9:53:11 PM    Final   TEE  ECHO TEE 01/26/2020  Narrative TRANSESOPHOGEAL ECHO REPORT    Patient Name:   SHONYA SUMIDA  Date of Exam: 01/26/2020 Medical Rec #:  [id number]   Height:       66.0 in Accession #:    [id number]  Weight:       136.7 lb Date of Birth:  01-14-1946   BSA:          1.701 m Patient Age:    74 years    BP:           118/66 mmHg Patient Gender: F           HR:           123 bpm. Exam Location:  Inpatient  Procedure: TEE-Intraopertive and 3D Echo  Indications:     atrial fibrillation  History:         Patient has prior history of Echocardiogram examinations, most recent 11/21/2019. Arrythmias:Atrial Fibrillation.  Mitral Valve: 32 mm prosthetic annuloplasty ring valve is present in the mitral position. Procedure Date: 01/17/2020.  Sonographer:     Tinnie Barefoot Referring Phys:  [id number] OZELL PRENTICE PASSEY Diagnosing Phys: Darryle Decent MD  PROCEDURE: After discussion of the risks and benefits of a TEE, an informed consent was obtained from the patient. TEE procedure time was 20 minutes. The transesophogeal probe was passed without difficulty through the esophogus of the patient. Imaged were obtained with the patient in a left lateral decubitus position. Local oropharyngeal anesthetic was provided with Cetacaine . Sedation performed by different physician. The patient was monitored while under deep sedation. Anesthestetic sedation was provided intravenously by Anesthesiology: 270mg  of Propofol . Image quality was excellent. The patient's vital signs; including heart rate, blood pressure, and oxygen saturation; remained stable throughout the procedure. The patient developed no complications during the procedure. A successful direct current cardioversion was performed at 200 joules with 1 attempt.  IMPRESSIONS   1. Left ventricular ejection fraction, by estimation, is 55 to 60%. The left ventricle has normal function. The left ventricle has no regional wall motion abnormalities. 2. Right ventricular systolic function is normal. The right ventricular size is normal. 3. S/p surgical LAA  clipping. No residual connection noted with the LA. No LA thrombus. No left atrial/left atrial appendage thrombus was detected. 4. 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 No residual MR. MVA by direct 3D MPR assessment 2.6 cm2. The mitral valve has been repaired/replaced. No evidence of mitral valve regurgitation. No evidence of mitral stenosis. The mean mitral valve gradient is 4.0 mmHg with average heart rate of 111 bpm. There is a 32 mm prosthetic annuloplasty ring present in the mitral position. Procedure Date: 01/17/2020. 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 6. The aortic valve is tricuspid. Aortic valve regurgitation is not visualized. No aortic stenosis is present.  Conclusion(s)/Recommendation(s): No LA/LAA thrombus identified. Successful cardioversion performed with restoration of normal sinus rhythm.  FINDINGS Left Ventricle: Left ventricular ejection fraction, by estimation, is 55 to 60%. The left ventricle has normal function. The left ventricle has no regional wall motion abnormalities. The left ventricular internal cavity size was normal in size. There is no left ventricular hypertrophy.  Right Ventricle: The right ventricular size is normal. No increase in right ventricular wall thickness. Right ventricular systolic function is normal.  Left [facility]: S/p surgical LAA clipping. No  residual connection noted with the LA. No LA thrombus. Left atrial size was normal in size. No left atrial/left atrial appendage thrombus was detected.  Right [facility]: Right atrial size was normal in size.  Pericardium: Trivial pericardial effusion is present.  Mitral Valve: S/p MV repair with 32 mm annuloplasty ring. No residual MR. MVA by direct 3D MPR assessment 2.6 cm2. The mitral valve has been repaired/replaced. No evidence of mitral valve regurgitation. There is a 32 mm prosthetic annuloplasty ring present in the mitral position. Procedure Date: 01/17/2020. No evidence of mitral valve stenosis.  The mean mitral valve gradient is 4.0 mmHg with average heart rate of 111 bpm.  Tricuspid Valve: The tricuspid valve is myxomatous. Tricuspid valve regurgitation is mild . No evidence of tricuspid stenosis.  Aortic Valve: The aortic valve is tricuspid. Aortic valve regurgitation is not visualized. No aortic stenosis is present.  Pulmonic Valve: The pulmonic valve was grossly normal. Pulmonic valve regurgitation is mild. No evidence of pulmonic stenosis.  Aorta: The aortic root, ascending aorta, aortic arch and descending aorta are all structurally normal, with no evidence of dilitation or obstruction.  Venous: The left upper pulmonary vein, left lower pulmonary vein and right upper pulmonary vein are normal.  IAS/Shunts: There is redundancy of the interatrial septum. No atrial level shunt detected by color flow Doppler.  Additional Comments: There is a small pleural effusion in the left lateral region.    AORTA Ao Root diam: 3.20 cm Ao Asc diam:  2.80 cm  MITRAL VALVE           TRICUSPID VALVE MV Mean grad: 4.0 mmHg TR Peak grad:   25.0 mmHg TR Vmax:        250.00 cm/s  Darryle Decent MD Electronically signed by Darryle Decent MD Signature Date/Time: 01/26/2020/11:22:07 AM    Final        ______________________________________________________________________________________________       Current Reported Medications:.    Current Meds  Medication Sig   acetaminophen  (TYLENOL ) 500 MG tablet Take 500 mg by mouth every 6 (six) hours as needed (pain.).   amiodarone  (PACERONE ) 200 MG tablet Take 200 mg by mouth daily.   aspirin  EC 81 MG tablet Take 1 tablet (81 mg total) by mouth daily. Swallow whole.   calcium  carbonate (OS-CAL) 1250 (500 Ca) MG chewable tablet Chew 1 tablet by mouth every evening.   Carboxymethylcellul-Glycerin  (LUBRICATING EYE DROPS OP) Place 1 drop into both eyes 3 (three) times daily as needed (dry/irritated eys.).   Cholecalciferol (VITAMIN D -3 PO) Take  1 tablet by mouth every evening.   clonazePAM  (KLONOPIN ) 0.5 MG tablet Take 0.5 tablets (0.25 mg total) by mouth 2 (two) times daily as needed for anxiety.   cyanocobalamin  (VITAMIN B12) 1000 MCG/ML injection Inject 1,000 mcg into the muscle every 30 (thirty) days.   denosumab  (PROLIA ) 60 MG/ML SOSY injection Inject 60 mg into the skin every 6 (six) months.   furosemide  (LASIX ) 20 MG tablet Take 20 mg by mouth every Tuesday, Thursday, and Saturday at 6 PM. Per patient taking 60 mg on these days   furosemide  (LASIX ) 40 MG tablet Take 1 tablet (40 mg total) by mouth daily.   levalbuterol  (XOPENEX  HFA) 45 MCG/ACT inhaler Inhale 2 puffs into the lungs every 4 (four) hours as needed for wheezing.   levothyroxine  (SYNTHROID ) 75 MCG tablet Take 1 tablet (75 mcg total) by mouth daily.   loratadine (CLARITIN) 10 MG tablet Take 10 mg by mouth in the morning.  metoprolol  tartrate (LOPRESSOR ) 25 MG tablet Take 1 tablet by mouth twice daily   mupirocin ointment (BACTROBAN) 2 % Apply 1 Application topically daily.   nystatin  (MYCOSTATIN /NYSTOP ) powder Apply topically 2 (two) times daily as needed (skin irritation (under breasts)).   ondansetron  (ZOFRAN ) 8 MG tablet Take 1 tablet (8 mg total) by mouth every 8 (eight) hours as needed for nausea or vomiting.   ondansetron  (ZOFRAN -ODT) 4 MG disintegrating tablet Take 1 tablet (4 mg total) by mouth every 8 (eight) hours as needed for nausea or vomiting.   oxyCODONE  (ROXICODONE ) 5 MG/5ML solution Take 4 mLs (4 mg total) by mouth every 4 (four) hours as needed for moderate pain.   pantoprazole  (PROTONIX ) 40 MG tablet Take 1 tablet by mouth once daily   polyethylene glycol (MIRALAX  / GLYCOLAX ) packet Take 17 g by mouth daily as needed for mild constipation.   potassium chloride  SA (KLOR-CON  M) 20 MEQ tablet Take 1 tablet (20 mEq total) by mouth 3 (three) times a week. Take on Tuesdays, Thursdays and Saturdays when you take Lasix  60 mg.   revefenacin  (YUPELRI ) 175  MCG/3ML nebulizer solution Inhale one vial in nebulizer once daily. Do not mix with other nebulized medications.   sodium chloride  (OCEAN) 0.65 % SOLN nasal spray Place 1 spray into both nostrils as needed for congestion (nose bleeds).   valACYclovir  (VALTREX ) 500 MG tablet Take 500 mg by mouth daily as needed (breakouts).    Physical Exam:    VS:  BP (!) 108/56   Pulse 62   Ht 5' 6 (1.676 m)   Wt 109 lb (49.4 kg)   SpO2 95%   BMI 17.59 kg/m    Wt Readings from Last 3 Encounters:  02/01/24 109 lb (49.4 kg)  01/28/24 106 lb (48.1 kg)  01/19/24 110 lb 6.4 oz (50.1 kg)    GEN: Well nourished, well developed in no acute distress NECK: No JVD; No carotid bruits CARDIAC: RRR, no murmurs, rubs, gallops RESPIRATORY:  Clear to auscultation without rales, wheezing or rhonchi  ABDOMEN: Soft, non-tender, non-distended EXTREMITIES:  No edema; No acute deformity     Asessement and Plan:.    Paroxysmal atrial fibrillation:  Patient with history of PAF, recently with recurrent atrial fibrillation.  She was started on amiodarone  200 mg twice daily for 2 weeks then 200 mg daily thereafter with recommendation to proceed with cardioversion.  Patient underwent successful cardioversion on 10/27/2023.  Patient denies any palpitations or feeling of increased heart rates, appears to be in normal sinus rhythm based on auscultation. Anticoagulation previously discontinued given recurrent nosebleeds, also noted she previously had left atrial appendage clipping at time of her previous maze procedure.  Continue aspirin  81 mg daily and amiodarone  200 mg daily.   S/p MVR: Echo from 01/2023 showed trace mitral regurgitation and no significant mitral stenosis. 32 mm Sorin MMO 40 prosthetic anoplasty ring in the mitral position. Continue SBE prophylaxis.   HTN: Blood pressure today 104/58. Continue current antihypertensive regimen.   Nonobstructive CAD: Patient has history of atypical chronic chest pain. Cardiac  catheterization in 2021 showed mild nonobstructive CAD.  Patient continues to note similar atypical chest pain that she reports is overall unchanged.  She has declined statin therapies.  Chronic HFpEF/Chronic dyspnea: Echocardiogram from 01/2023 showed EF 55 to 60%, mild LVH, moderately reduced RV function with moderately elevated PA systolic pressure, moderate TR.  Patient also follows with pulmonology, has underlying bronchiectasis, pulmonology notes shortness of breath multifactorial from moderate restriction of PFTs,  bronchiectasis and deconditioning.  Patient reports that she has not been using her nebulizer as she was concerned it was contributing to her headaches, reports no significant improvement with stopping of nebulizer, plans to resume.  OSA: Patient's previously noted to have nocturnal hypoxemia on overnight oxygen test in 09/2022. Patient has declined CPAP therapy.   Severe anxiety: Managed by primary care.   Disposition: F/u with Markevion Lattin, NP in four months   Signed, Jaskirat Schwieger D Marjoria Mancillas, NP

## 2024-02-01 ENCOUNTER — Encounter: Payer: Self-pay | Admitting: Cardiology

## 2024-02-01 ENCOUNTER — Ambulatory Visit: Attending: Cardiology | Admitting: Cardiology

## 2024-02-01 VITALS — BP 108/56 | HR 62 | Ht 66.0 in | Wt 109.0 lb

## 2024-02-01 DIAGNOSIS — Z8679 Personal history of other diseases of the circulatory system: Secondary | ICD-10-CM | POA: Diagnosis not present

## 2024-02-01 DIAGNOSIS — I5032 Chronic diastolic (congestive) heart failure: Secondary | ICD-10-CM | POA: Diagnosis not present

## 2024-02-01 DIAGNOSIS — Z9889 Other specified postprocedural states: Secondary | ICD-10-CM | POA: Insufficient documentation

## 2024-02-01 DIAGNOSIS — I4819 Other persistent atrial fibrillation: Secondary | ICD-10-CM | POA: Insufficient documentation

## 2024-02-01 DIAGNOSIS — I10 Essential (primary) hypertension: Secondary | ICD-10-CM | POA: Diagnosis not present

## 2024-02-01 DIAGNOSIS — R0789 Other chest pain: Secondary | ICD-10-CM | POA: Diagnosis not present

## 2024-02-01 NOTE — Patient Instructions (Signed)
 Medication Instructions:  No changes *If you need a refill on your cardiac medications before your next appointment, please call your pharmacy*  Lab Work: No labs  Testing/Procedures: No testing  Follow-Up: At [facility], you and your health needs are our priority.  As part of our continuing mission to provide you with exceptional heart care, our providers are all part of one team.  This team includes your primary Cardiologist (physician) and Advanced Practice Providers or APPs (Physician Assistants and Nurse Practitioners) who all work together to provide you with the care you need, when you need it.  Your next appointment:   4 month(s)  Provider:   Katlyn West, NP  We recommend signing up for the patient portal called MyChart.  Sign up information is provided on this After Visit Summary.  MyChart is used to connect with patients for Virtual Visits (Telemedicine).  Patients are able to view lab/test results, encounter notes, upcoming appointments, etc.  Non-urgent messages can be sent to your provider as well.   To learn more about what you can do with MyChart, go to [email or web address].

## 2024-02-02 ENCOUNTER — Other Ambulatory Visit: Payer: Self-pay

## 2024-02-03 NOTE — Patient Instructions (Signed)
 Visit Information  Thank you for taking time to visit with me today. Please don't hesitate to contact me if I can be of assistance to you before our next scheduled telephone appointment.  Our next appointment is by telephone on 02/10/2024 at 3pm.  Following is a copy of your care plan:   Goals Addressed             This Visit's Progress    VBCI RN Care Plan       Problems:  Chronic Disease Management support and education needs related to High Risk for Falls  Goal: Over the next 4 months the Patient will demonstrate ongoing self health care management ability and take all Fall Precautions necessary to ensure no further falls as evidenced by    no further falls  Interventions:   Falls Interventions: Provided written and verbal education re: potential causes of falls and Fall prevention strategies Reviewed medications and discussed potential side effects of medications such as dizziness and frequent urination Advised patient of importance of notifying provider of falls Assessed for signs and symptoms of orthostatic hypotension Assessed patients knowledge of fall risk prevention secondary to previously provided education Provided patient information for fall alert systems  Patient Self-Care Activities:  Attend all scheduled provider appointments Call provider office for new concerns or questions  Perform all self care activities independently  Take medications as prescribed   Work with the social worker to address care coordination needs and will continue to work with the clinical team to address health care and disease management related needs  Plan:  The patient has been provided with contact information for the care management team and has been advised to call with any health related questions or concerns.  Next RN Care Management appointment with Channing is on 02/10/2024 at 3pm.             Patient verbalizes understanding of instructions and care plan provided today and  agrees to view in MyChart. Active MyChart status and patient understanding of how to access instructions and care plan via MyChart confirmed with patient.     The patient has been provided with contact information for the care management team and has been advised to call with any health related questions or concerns.   Please call the care guide team at [phone] if you need to cancel or reschedule your appointment.   Please call 1-800-273-TALK (toll free, 24 hour hotline) if you are experiencing a Mental Health or Behavioral Health Crisis or need someone to talk to.  Moet Mikulski A. Gordy RN, BA, [facility], CRRN [place]  [facility] Population Health RN Care Manager Direct Dial: [phone]  Fax: [phone]

## 2024-02-04 ENCOUNTER — Encounter: Payer: Self-pay | Admitting: Student in an Organized Health Care Education/Training Program

## 2024-02-04 DIAGNOSIS — G894 Chronic pain syndrome: Secondary | ICD-10-CM | POA: Diagnosis not present

## 2024-02-04 DIAGNOSIS — M7912 Myalgia of auxiliary muscles, head and neck: Secondary | ICD-10-CM | POA: Diagnosis not present

## 2024-02-04 DIAGNOSIS — G603 Idiopathic progressive neuropathy: Secondary | ICD-10-CM | POA: Diagnosis not present

## 2024-02-04 DIAGNOSIS — G5603 Carpal tunnel syndrome, bilateral upper limbs: Secondary | ICD-10-CM | POA: Diagnosis not present

## 2024-02-04 DIAGNOSIS — G43711 Chronic migraine without aura, intractable, with status migrainosus: Secondary | ICD-10-CM | POA: Diagnosis not present

## 2024-02-04 DIAGNOSIS — M7918 Myalgia, other site: Secondary | ICD-10-CM | POA: Diagnosis not present

## 2024-02-04 NOTE — Telephone Encounter (Signed)
 Patient would like advise about appointment. Please see details below

## 2024-02-04 NOTE — Patient Outreach (Signed)
 Complex Care Management   Visit Note  02/02/2024  Name:  [name] MRN: [id number] DOB: Jan 18, 1946  Situation: Referral received for Complex Care Management related to Atrial Fibrillation, Generalized Anxiety Disorder, and High Risk for Falls. I obtained verbal consent from Patient.  Visit completed with patient  on the phone  Background:   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   Mitral regurgitation    Osteopenia 02/18/2012   Osteoporosis    PAF (paroxysmal atrial fibrillation) (HCC) 2012   Peripheral neuropathy 11/06/2011   PMDD (premenstrual dysphoric disorder)    PONV (postoperative nausea and vomiting)    nausea, vomiting, hives and dizziness    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/2020   Complete bilateral atrial lesion set using cryothermy and bipolar radiofrequency ablation with clipping of LA appendage via right mini-thoracotomy approach   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   Complex valvuloplasty including artificial Gore-tex neochord placement x12 with 32mm Sorin Memo 4D ring annuloplasty   Seasonal allergies    Vaginal delivery    ONE NSVD   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    Assessment: Patient Reported Symptoms:  Cognitive Cognitive Status: Alert and oriented to person, place, and time, Normal speech and language skills, Insightful and able to interpret abstract concepts Cognitive/Intellectual Conditions Management [RPT]: None reported or  documented in medical history or problem list   Health Maintenance Behaviors: Annual physical exam, Healthy diet Healing Pattern: Unsure Health Facilitated by: Healthy diet  Neurological Neurological Review of Symptoms: No symptoms reported    HEENT HEENT Symptoms Reported: Change or loss of hearing, Other: (Chronic bilateral eustachain tube dysfunction) HEENT Management Strategies: Coping strategies    Cardiovascular Cardiovascular Symptoms Reported: No symptoms reported Does patient have uncontrolled Hypertension?: No Cardiovascular Management Strategies: Medication therapy, Coping strategies, Adequate rest, Weight management, Diet modification, Routine screening Do You Have a Working Readable Scale?: Yes Cardiovascular Self-Management Outcome: 3 (uncertain) Cardiovascular Comment: PMH - Persistent AFib, Chronic diastolic heart failure, s/p mitral valve replacement, s/p Maze procedure  for AFib, CAD, HTN, Chronic dyspnea  Respiratory Respiratory Symptoms Reported: Shortness of breath Other Respiratory Symptoms: Patient states she suffers from cheonic dyspnea and gets short of breath just talking. Additional Respiratory Details: has been diagnosed with sleep apnea but cannot tolerate CPAP d/t patient's c/o claustrophobia Respiratory Management Strategies: Adequate rest, Breathing techniques, Routine screening, Medication therapy, Coping strategies Respiratory Self-Management Outcome: 3 (uncertain)  Endocrine Endocrine Symptoms Reported: No symptoms reported Is patient diabetic?: No Endocrine Comment: PMH - Hypothyroid  Gastrointestinal Gastrointestinal Symptoms Reported: Constipation, Reflux/heartburn Additional Gastrointestinal Details: Therapeutic opioid-induced constipation - on oxycodone  for chronic pain management Gastrointestinal Management Strategies: Medication therapy, Bowel program, Adequate rest, Coping strategies Gastrointestinal Self-Management Outcome: 4 (good)     Genitourinary Genitourinary Symptoms Reported: No symptoms reported    Integumentary Integumentary Symptoms Reported: No symptoms reported    Musculoskeletal Musculoskelatal Symptoms Reviewed:  Weakness, Limited mobility Musculoskeletal Management Strategies: Medical device, Coping strategies, Adequate rest, Routine screening Musculoskeletal Self-Management Outcome: 3 (uncertain) Musculoskeletal Comment: PMH Osteoporosis - takes Prolia  2x/yr Falls in the past year?: Yes Number of falls in past year: 1 or less Was there an injury with Fall?: No Fall Risk Category Calculator: 1 Patient Fall Risk Level: Low Fall Risk Patient at Risk for Falls Due to: History of fall(s), Impaired mobility, Medication side effect Fall risk Follow up: Falls evaluation completed, Education provided, Falls prevention discussed  Psychosocial Psychosocial Symptoms Reported: Anxiety - if selected complete GAD Additional Psychological Details: States 3 main stressors in order of priority - #1 my health, #2  my son's health - he has a lot of problems and was recently discharged from hospital for issues relating to his back , # finances Behavioral Management Strategies: Coping strategies, Medication therapy Behavioral Health Self-Management Outcome: 3 (uncertain) Behavioral Health Comment: Referral made to LCSW for counseling; takes Klonopin  sparingly prn to help manage anxiety, reviewed the importance of not taking Klonopin  at the same time she takes her oxycodone  d/t high fsll risk status Major Change/Loss/Stressor/Fears (CP): Medical condition, family, Medical condition, self, Resources Behaviors When Feeling Stressed/Fearful: Increased feelings of anxiety and depression Techniques to Cope with Loss/Stress/Change: Medication, Diversional activities Quality of Family Relationships: helpful, involved, supportive Do you feel physically threatened by others?: No      01/19/2024    2:14 PM  Depression screen PHQ 2/9   Decreased Interest 3  Down, Depressed, Hopeless 2  PHQ - 2 Score 5  Altered sleeping 2  Tired, decreased energy 3  Feeling bad or failure about yourself  0  Moving slowly or fidgety/restless 0  Suicidal thoughts 0  PHQ-9 Score 10  Difficult doing work/chores Very difficult    There were no vitals filed for this visit.  Medications Reviewed Today     Reviewed by Gordy Channing LABOR, RN (Registered Nurse) on 02/02/24 at 1525  Med List Status: <None>   Medication Order Taking? Sig Documenting Provider Last Dose Status Informant  acetaminophen  (TYLENOL ) 500 MG tablet [id number] Yes Take 500 mg by mouth every 6 (six) hours as needed (pain.). [provider]  Active Self  amiodarone  (PACERONE ) 200 MG tablet [id number] Yes Take 200 mg by mouth daily. [provider]  Active   aspirin  EC 81 MG tablet [id number] Yes Take 1 tablet (81 mg total) by mouth daily. Swallow whole. Pietro Redell RAMAN, MD  Active   calcium  carbonate (OS-CAL) 1250 (500 Ca) MG chewable tablet [id number] Yes Chew 1 tablet by mouth every evening. [provider]  Active Self  Calcium  Carbonate Antacid (TUMS CHEWY BITES PO) [id number]  Take 1 tablet by mouth daily as needed (reflux).   Patient not taking: Reported on 02/02/2024   [provider]  Active Self  Carboxymethylcellul-Glycerin  (LUBRICATING EYE DROPS OP) [id number] Yes Place 1 drop into both eyes 3 (three) times daily as needed (dry/irritated eys.). [provider]  Active Self  Cholecalciferol (VITAMIN D -3 PO) [id number]  Take 1 tablet by mouth every evening. [provider]  Active Self  clonazePAM  (KLONOPIN ) 0.5 MG tablet [id number] Yes Take 0.5 tablets (0.25 mg total) by mouth 2 (two) times daily as needed for anxiety. [name], MD  Active   cyanocobalamin  (VITAMIN B12) 1000 MCG/ML injection [id number] Yes Inject 1,000 mcg into the muscle every 30 (thirty) days. [provider]  Active Self   denosumab  (PROLIA ) 60 MG/ML SOSY injection [id number] Yes Inject 60  mg into the skin every 6 (six) months. [provider]  Active Self           Med Note JACKOLYN APOLINAR JONELLE Pablo Jun 11, 2020  2:33 PM)    furosemide  (LASIX ) 20 MG tablet [id number] Yes Take 20 mg by mouth every Tuesday, Thursday, and Saturday at 6 PM. Per patient taking 60 mg on these days [provider]  Active Self  furosemide  (LASIX ) 40 MG tablet [id number] Yes Take 1 tablet (40 mg total) by mouth daily. Pietro Redell RAMAN, MD  Active Self  levalbuterol  (XOPENEX  HFA) 45 MCG/ACT inhaler [id number] Yes Inhale 2 puffs into the lungs every 4 (four) hours as needed for wheezing. Kara Dorn NOVAK, MD  Active   levothyroxine  (SYNTHROID ) 75 MCG tablet [id number] Yes Take 1 tablet (75 mcg total) by mouth daily. [name], MD  Active   loratadine (CLARITIN) 10 MG tablet [id number] Yes Take 10 mg by mouth in the morning. [provider]  Active Self  metoprolol  tartrate (LOPRESSOR ) 25 MG tablet [id number] Yes Take 1 tablet by mouth twice daily Pietro Redell RAMAN, MD  Active Self  mupirocin ointment (BACTROBAN) 2 % [id number] Yes Apply 1 Application topically daily. [provider]  Active Self  nystatin  (MYCOSTATIN /NYSTOP ) powder [id number] Yes Apply topically 2 (two) times daily as needed (skin irritation (under breasts)). Maurice Sharlet RAMAN, PA-C  Active Self  ondansetron  (ZOFRAN ) 8 MG tablet [id number] Yes Take 1 tablet (8 mg total) by mouth every 8 (eight) hours as needed for nausea or vomiting. Tabori, Katherine E, MD  Active Self  ondansetron  (ZOFRAN -ODT) 4 MG disintegrating tablet [id number] Yes Take 1 tablet (4 mg total) by mouth every 8 (eight) hours as needed for nausea or vomiting. Ruthe Cornet, DO  Active   oxyCODONE  (ROXICODONE ) 5 MG/5ML solution [id number] Yes Take 4 mLs (4 mg total) by mouth every 4 (four) hours as needed for moderate pain. Maurice Sharlet RAMAN, PA-C  Active Self           Med  Note JACKOLYN APOLINAR JONELLE Pablo Jun 11, 2020  2:33 PM)    pantoprazole  (PROTONIX ) 40 MG tablet [id number] Yes Take 1 tablet by mouth once daily Dewald, Jonathan B, MD  Active Self  polyethylene glycol (MIRALAX  / GLYCOLAX ) packet [id number] Yes Take 17 g by mouth daily as needed for mild constipation. [provider]  Active Self  potassium chloride  SA (KLOR-CON  M) 20 MEQ tablet [id number] Yes Take 1 tablet (20 mEq total) by mouth 3 (three) times a week. Take on Tuesdays, Thursdays and Saturdays when you take Lasix  60 mg. West, Katlyn D, NP  Active   revefenacin  (YUPELRI ) 175 MCG/3ML nebulizer solution [id number] Yes Inhale one vial in nebulizer once daily. Do not mix with other nebulized medications. Kara Dorn NOVAK, MD  Active   sodium chloride  (OCEAN) 0.65 % SOLN nasal spray [id number] Yes Place 1 spray into both nostrils as needed for congestion (nose bleeds). [provider]  Active Self  Ubrogepant (UBRELVY) 100 MG TABS [id number] Yes Take 25 mg by mouth daily as needed (headaches/migraines). [provider]  Active Self  valACYclovir  (VALTREX ) 500 MG tablet [id number] Yes Take 500 mg by mouth daily as needed (breakouts). [provider]  Active Self            Recommendation:   Has scheduled 02/05/2024 PCP Follow-up Referral to: LCSW for counseling  Follow Up Plan:   Telephone follow up appointment date/time:  02/10/2024  at 3pm with [facility]  Bliss Behnke A. Gordy RN, BA, [facility], CRRN   [facility] Population Health RN Care Manager Direct Dial: [phone]  Fax: [phone]

## 2024-02-05 ENCOUNTER — Ambulatory Visit: Admitting: Student in an Organized Health Care Education/Training Program

## 2024-02-08 ENCOUNTER — Other Ambulatory Visit: Payer: Self-pay

## 2024-02-08 MED ORDER — FUROSEMIDE 40 MG PO TABS: 40.0000 mg | ORAL_TABLET | Freq: Every day | ORAL | 3 refills | Status: DC

## 2024-02-10 ENCOUNTER — Other Ambulatory Visit: Payer: Self-pay

## 2024-02-10 VITALS — Wt 109.0 lb

## 2024-02-10 DIAGNOSIS — R0609 Other forms of dyspnea: Secondary | ICD-10-CM

## 2024-02-10 DIAGNOSIS — G8929 Other chronic pain: Secondary | ICD-10-CM

## 2024-02-10 DIAGNOSIS — F32A Depression, unspecified: Secondary | ICD-10-CM

## 2024-02-10 DIAGNOSIS — F411 Generalized anxiety disorder: Secondary | ICD-10-CM

## 2024-02-10 DIAGNOSIS — I4811 Longstanding persistent atrial fibrillation: Secondary | ICD-10-CM

## 2024-02-10 DIAGNOSIS — I5032 Chronic diastolic (congestive) heart failure: Secondary | ICD-10-CM

## 2024-02-10 NOTE — Patient Outreach (Signed)
 Complex Care Management   Visit Note  02/10/2024  Name:  ARSHIA SPELLMAN MRN: [id number] DOB: January 13, 1946  Situation: Referral received for Complex Care Management related to Chronic back pain, History of falls & High Risk for falls, Atrial Fibrillation w/ long term use of anticoagulation, Anxiety & Depression. I obtained verbal consent from Patient.  Visit completed with patient  on the phone  Background:   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   Mitral regurgitation    Osteopenia 02/18/2012   Osteoporosis    PAF (paroxysmal atrial fibrillation) (HCC) 2012   Peripheral neuropathy 11/06/2011   PMDD (premenstrual dysphoric disorder)    PONV (postoperative nausea and vomiting)    nausea, vomiting, hives and dizziness    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/2020   Complete bilateral atrial lesion set using cryothermy and bipolar radiofrequency ablation with clipping of LA appendage via right mini-thoracotomy approach   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   Complex valvuloplasty including artificial Gore-tex neochord placement x12 with 32mm Sorin Memo 4D ring annuloplasty   Seasonal allergies    Vaginal delivery    ONE NSVD   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    Assessment: Patient Reported Symptoms:  Cognitive Cognitive Status: Normal speech and language skills, Alert and oriented to person, place, and time, Insightful and able to interpret abstract  concepts Cognitive/Intellectual Conditions Management [RPT]: None reported or documented in medical history or problem list   Health Maintenance Behaviors: Annual physical exam, Healthy diet Healing Pattern: Unsure Health Facilitated by: Healthy diet, Pain control  Neurological Neurological Review of Symptoms: Dizziness (has follow up appt w/ PCP on 02/22/24 to address ongoing c/o dizziness) Neurological Management Strategies: Medication therapy, Coping strategies, Routine screening, Adequate rest Neurological Self-Management Outcome: 3 (uncertain)  HEENT HEENT Symptoms Reported: Change or loss of hearing HEENT Management Strategies: Coping strategies, Routine screening HEENT Self-Management Outcome: 3 (uncertain)    Cardiovascular Cardiovascular Symptoms Reported: No symptoms reported Does patient have uncontrolled Hypertension?: No Cardiovascular Management Strategies: Medication therapy, Coping strategies, Adequate rest, Weight management, Diet modification, Routine screening Do You Have a Working Readable Scale?: Yes Weight: 109 lb (49.4 kg) Cardiovascular Self-Management Outcome: 3 (uncertain)  Respiratory Respiratory Symptoms Reported: Shortness of breath Other Respiratory Symptoms: Patient states she suffers from chronic dyspnea and gets short of breath just talking. Respiratory Management Strategies: Adequate rest, Breathing techniques, Routine screening, Medication therapy, Coping strategies Respiratory Self-Management Outcome: 3 (uncertain)  Endocrine Endocrine Symptoms Reported: No symptoms reported    Gastrointestinal Gastrointestinal Symptoms Reported: Constipation, Reflux/heartburn Gastrointestinal Management Strategies: Medication therapy, Bowel program, Adequate rest, Coping strategies Gastrointestinal Self-Management Outcome: 4 (good)    Genitourinary Genitourinary Symptoms Reported: No symptoms reported    Integumentary Integumentary Symptoms Reported: No symptoms  reported    Musculoskeletal Musculoskelatal Symptoms Reviewed: Weakness, Limited mobility Musculoskeletal Management Strategies: Medical device, Coping strategies, Adequate rest, Routine screening Musculoskeletal Self-Management  Outcome: 3 (uncertain) Falls in the past year?: Yes    Psychosocial Psychosocial Symptoms Reported: Anxiety - if selected complete GAD, Depression - if selected complete PHQ 2-9 Behavioral Management Strategies: Coping strategies, Medication therapy Behavioral Health Self-Management Outcome: 3 (uncertain) Major Change/Loss/Stressor/Fears (CP): Medical condition, self, Medical condition, family, Resources Behaviors When Feeling Stressed/Fearful: Increased feelings of anxiety and depression especially when she is experiencing more chronic pain - has very few good days she says Techniques to [employer] with Loss/Stress/Change: Medication, Diversional activities Quality of Family Relationships: helpful, supportive Do you feel physically threatened by others?: No      01/19/2024    2:14 PM  Depression screen PHQ 2/9  Decreased Interest 3  Down, Depressed, Hopeless 2  PHQ - 2 Score 5  Altered sleeping 2  Tired, decreased energy 3  Feeling bad or failure about yourself  0  Moving slowly or fidgety/restless 0  Suicidal thoughts 0  PHQ-9 Score 10  Difficult doing work/chores Very difficult    There were no vitals filed for this visit.  Medications Reviewed Today     Reviewed by Gordy Channing LABOR, RN (Registered Nurse) on 02/10/24 at 1531  Med List Status: <None>   Medication Order Taking? Sig Documenting Provider Last Dose Status Informant  acetaminophen  (TYLENOL ) 500 MG tablet [id number] Yes Take 500 mg by mouth every 6 (six) hours as needed (pain.). [provider]  Active Self  amiodarone  (PACERONE ) 200 MG tablet [id number] Yes Take 200 mg by mouth daily. [provider]  Active   aspirin  EC 81 MG tablet [id number] Yes Take 1 tablet (81 mg total)  by mouth daily. Swallow whole. Pietro Redell RAMAN, MD  Active   calcium  carbonate (OS-CAL) 1250 (500 Ca) MG chewable tablet [id number] Yes Chew 1 tablet by mouth every evening. [provider]  Active Self  Calcium  Carbonate Antacid (TUMS CHEWY BITES PO) [id number] Yes Take 1 tablet by mouth daily as needed (reflux).  [provider]  Active Self  Carboxymethylcellul-Glycerin  (LUBRICATING EYE DROPS OP) [id number] Yes Place 1 drop into both eyes 3 (three) times daily as needed (dry/irritated eys.). [provider]  Active Self  Cholecalciferol (VITAMIN D -3 PO) [id number] Yes Take 1 tablet by mouth every evening. [provider]  Active Self  clonazePAM  (KLONOPIN ) 0.5 MG tablet [id number] Yes Take 0.5 tablets (0.25 mg total) by mouth 2 (two) times daily as needed for anxiety. [name], MD  Active   cyanocobalamin  (VITAMIN B12) 1000 MCG/ML injection [id number] Yes Inject 1,000 mcg into the muscle every 30 (thirty) days. [provider]  Active Self  denosumab  (PROLIA ) 60 MG/ML SOSY injection [id number] Yes Inject 60 mg into the skin every 6 (six) months. [provider]  Active Self           Med Note JACKOLYN APOLINAR JONELLE Pablo Jun 11, 2020  2:33 PM)    furosemide  (LASIX ) 20 MG tablet [id number] Yes Take 20 mg by mouth every Tuesday, Thursday, and Saturday at 6 PM. Per patient taking 60 mg on these days [provider]  Active Self  furosemide  (LASIX ) 40 MG tablet [id number] Yes Take 1 tablet (40 mg total) by mouth daily. Pietro Redell RAMAN, MD  Active   levalbuterol  (XOPENEX  HFA) 45 MCG/ACT inhaler [id number] Yes Inhale 2 puffs into the lungs every 4 (four) hours as needed for wheezing. Kara Dorn NOVAK, MD  Active   levothyroxine  (SYNTHROID ) 75 MCG tablet [id number] Yes Take 1 tablet (75 mcg total) by  mouth daily. [name], MD  Active   loratadine (CLARITIN) 10 MG tablet [id number] Yes Take 10 mg by mouth in the morning.  [provider]  Active Self  metoprolol  tartrate (LOPRESSOR ) 25 MG tablet [id number] Yes Take 1 tablet by mouth twice daily Pietro Redell RAMAN, MD  Active Self  mupirocin ointment (BACTROBAN) 2 % [id number] Yes Apply 1 Application topically daily. [provider]  Active Self  nystatin  (MYCOSTATIN /NYSTOP ) powder [id number] Yes Apply topically 2 (two) times daily as needed (skin irritation (under breasts)). Maurice Sharlet RAMAN, PA-C  Active Self  ondansetron  (ZOFRAN ) 8 MG tablet [id number] Yes Take 1 tablet (8 mg total) by mouth every 8 (eight) hours as needed for nausea or vomiting. Tabori, Katherine E, MD  Active Self  ondansetron  (ZOFRAN -ODT) 4 MG disintegrating tablet [id number] Yes Take 1 tablet (4 mg total) by mouth every 8 (eight) hours as needed for nausea or vomiting. Ruthe Cornet, DO  Active   oxyCODONE  (ROXICODONE ) 5 MG/5ML solution [id number] Yes Take 4 mLs (4 mg total) by mouth every 4 (four) hours as needed for moderate pain. Maurice Sharlet RAMAN, PA-C  Active Self           Med Note JACKOLYN APOLINAR JONELLE Pablo Jun 11, 2020  2:33 PM)    pantoprazole  (PROTONIX ) 40 MG tablet [id number] Yes Take 1 tablet by mouth once daily Dewald, Jonathan B, MD  Active Self  polyethylene glycol (MIRALAX  / GLYCOLAX ) packet [id number] Yes Take 17 g by mouth daily as needed for mild constipation. [provider]  Active Self  potassium chloride  SA (KLOR-CON  M) 20 MEQ tablet [id number] Yes Take 1 tablet (20 mEq total) by mouth 3 (three) times a week. Take on Tuesdays, Thursdays and Saturdays when you take Lasix  60 mg. West, Katlyn D, NP  Active   revefenacin  (YUPELRI ) 175 MCG/3ML nebulizer solution [id number] Yes Inhale one vial in nebulizer once daily. Do not mix with other nebulized medications. Kara Dorn NOVAK, MD  Active   sodium chloride  (OCEAN) 0.65 % SOLN nasal spray [id number] Yes Place 1 spray into both nostrils as needed for congestion (nose bleeds). [provider]  Active Self   Ubrogepant (UBRELVY) 100 MG TABS [id number] Yes Take 25 mg by mouth daily as needed (headaches/migraines). [provider]  Active Self  valACYclovir  (VALTREX ) 500 MG tablet [id number] Yes Take 500 mg by mouth daily as needed (breakouts). [provider]  Active Self            Recommendation:   PCP Follow-up on 02/22/24 re ongoing complaints of dizziness  Follow Up Plan:   Telephone follow up appointment date/time:  scheduled with RN Care Manager on Monday, 02/15/24 at 4pm Referral to Licensed Clinical Social Worker emergently placed  [place] A. Gordy RN, BA, [facility], CRRN [place]  [facility] Population Health RN Care Manager Direct Dial: [phone]  Fax: [phone]

## 2024-02-10 NOTE — Patient Instructions (Signed)
 Visit Information  Thank you for taking time to visit with me today. Please don't hesitate to contact me if I can be of assistance to you before our next scheduled telephone appointment.  Our next appointment is by telephone on 02/15/24 at 4pm  Following is a copy of your care plan:   Goals Addressed             This Visit's Progress    VBCI RN Care Plan       Problems:  Chronic Disease Management support and education needs related to High Risk for Falls  Goal: Over the next 4 months the Patient will demonstrate ongoing self health care management ability and take all Fall Precautions necessary to ensure no further falls as evidenced by    no further falls  Interventions:   Falls Interventions: Provided written and verbal education re: potential causes of falls and Fall prevention strategies Reviewed medications and discussed potential side effects of medications such as dizziness and frequent urination Advised patient of importance of notifying provider of falls Assessed for signs and symptoms of orthostatic hypotension Assessed patients knowledge of fall risk prevention secondary to previously provided education Provided patient information for fall alert systems  Patient Self-Care Activities:  Attend all scheduled provider appointments Call provider office for new concerns or questions  Perform all self care activities independently  Take medications as prescribed   Work with the social worker to address care coordination needs and will continue to work with the clinical team to address health care and disease management related needs  Plan:  The patient has been provided with contact information for the care management team and has been advised to call with any health related questions or concerns.  Referral to LCSW to help patient manage daily stressors better Next RN Care Management appointment with Channing is on 02/15/2024 at 4pm.             Patient verbalizes  understanding of instructions and care plan provided today and agrees to view in MyChart. Active MyChart status and patient understanding of how to access instructions and care plan via MyChart confirmed with patient.     The patient has been provided with contact information for the care management team and has been advised to call with any health related questions or concerns.   Please call the care guide team at [phone] if you need to cancel or reschedule your appointment.   Please call 1-800-273-TALK (toll free, 24 hour hotline) if you are experiencing a Mental Health or Behavioral Health Crisis or need someone to talk to.  Jamarie [place] A. Gordy RN, BA, [facility], CRRN [place]  [facility] Population Health RN Care Manager Direct Dial: [phone]  Fax: [phone]

## 2024-02-11 ENCOUNTER — Telehealth: Payer: Self-pay | Admitting: *Deleted

## 2024-02-11 NOTE — Progress Notes (Signed)
 Complex Care Management Note  Care Guide Note 02/11/2024 Name: [name] MRN: [id number] DOB: January 09, 1946  [name] is a 78 y.o. year old female who sees Tabori, Comer BRAVO, MD for primary care. I reached out to [name] to offer complex care management services.  [name] was given information about Complex Care Management services [name] including:   The Complex Care Management services include support from the care team which includes your Nurse Care Manager, Clinical Social Worker, or Pharmacist.  The Complex Care Management team is here to help remove barriers to the health concerns and goals most important to you. Complex Care Management services are voluntary, and the patient may decline or stop services at any time by request to their care team member.   Complex Care Management Consent Status: Patient agreed to services and verbal consent obtained.   Follow up plan:  Telephone appointment with complex care management team member scheduled for:  02/12/2024  Encounter Outcome:  Patient Scheduled  Thedford Franks, CMA [place]  [facility], [facility] Guide Direct Dial: [phone]  Fax: [phone] Website: [email or web address]

## 2024-02-12 ENCOUNTER — Telehealth: Payer: Self-pay | Admitting: Licensed Clinical Social Worker

## 2024-02-15 ENCOUNTER — Other Ambulatory Visit: Payer: Self-pay

## 2024-02-15 NOTE — Patient Instructions (Signed)
 Visit Information  Thank you for taking time to visit with me today. Please don't hesitate to contact me if I can be of assistance to you before our next scheduled telephone appointment.  Our next appointment is by telephone on 02/29/24 at 10am  Following is a copy of your care plan:   Goals Addressed             This Visit's Progress    VBCI RN Care Plan       Problems:  Chronic Disease Management support and education needs related to High Risk for Falls  Goal: Over the next 4 months the Patient will demonstrate ongoing self health care management ability and take all Fall Precautions necessary to ensure no further falls as evidenced by    no further falls  Interventions:   Falls Interventions: Provided written and verbal education re: potential causes of falls and Fall prevention strategies Reviewed medications and discussed potential side effects of medications such as dizziness and frequent urination Advised patient of importance of notifying provider of falls Assessed for signs and symptoms of orthostatic hypotension Assessed patients knowledge of fall risk prevention secondary to previously provided education Provided patient information for fall alert systems  Patient Self-Care Activities:  Attend all scheduled provider appointments Call provider office for new concerns or questions  Perform all self care activities independently  Take medications as prescribed   Work with the social worker to address care coordination needs and will continue to work with the clinical team to address health care and disease management related needs  Plan:  The patient has been provided with contact information for the care management team and has been advised to call with any health related questions or concerns.  Referral to LCSW to help patient manage daily stressors better Next RN Care Management appointment with Channing is on 02/29/2024 at 10 am.             Patient  verbalizes understanding of instructions and care plan provided today and agrees to view in MyChart. Active MyChart status and patient understanding of how to access instructions and care plan via MyChart confirmed with patient.     The patient has been provided with contact information for the care management team and has been advised to call with any health related questions or concerns.   Please call the care guide team at [phone] if you need to cancel or reschedule your appointment.   Please call 1-800-273-TALK (toll free, 24 hour hotline) if you are experiencing a Mental Health or Behavioral Health Crisis or need someone to talk to.   Livana Yerian A. Gordy RN, BA, [facility], CRRN [place]  [facility] Population Health RN Care Manager Direct Dial: [phone]  Fax: [phone]

## 2024-02-16 NOTE — Patient Outreach (Signed)
 Complex Care Management   Visit Note  02/16/2024  Name:  [name] MRN: [id number] DOB: 05/18/1946  Situation: Referral received for Complex Care Management related to High Risk for Falls/ History of Frequent Falls and Anxiety & Depression (referral made to CCM LCSW), I obtained verbal consent from Patient.  Visit completed with patient  on the phone  Background:   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   Mitral regurgitation    Osteopenia 02/18/2012   Osteoporosis    PAF (paroxysmal atrial fibrillation) (HCC) 2012   Peripheral neuropathy 11/06/2011   PMDD (premenstrual dysphoric disorder)    PONV (postoperative nausea and vomiting)    nausea, vomiting, hives and dizziness    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/2020   Complete bilateral atrial lesion set using cryothermy and bipolar radiofrequency ablation with clipping of LA appendage via right mini-thoracotomy approach   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   Complex valvuloplasty including artificial Gore-tex neochord placement x12 with 32mm Sorin Memo 4D ring annuloplasty   Seasonal allergies    Vaginal delivery    ONE NSVD   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    Assessment: Patient Reported Symptoms:  Cognitive Cognitive Status: Alert and oriented to person, place, and time, Normal speech and language skills, Insightful and able to interpret abstract concepts Cognitive/Intellectual Conditions Management  [RPT]: None reported or documented in medical history or problem list   Health Maintenance Behaviors: Annual physical exam, Healthy diet Health Facilitated by: Healthy diet, Pain control  Neurological Neurological Review of Symptoms: No symptoms reported    HEENT HEENT Symptoms Reported: Change or loss of hearing      Cardiovascular Cardiovascular Symptoms Reported: No symptoms reported    Respiratory Respiratory Symptoms Reported: Shortness of breath Other Respiratory Symptoms: Patient states she suffers from chronic dyspnea and gets short of breath just trying to maintain a conversation with someone else Respiratory Management Strategies: Adequate rest, Breathing techniques, Routine screening, Medication therapy, Coping strategies Respiratory Self-Management Outcome: 3 (uncertain)  Endocrine Endocrine Symptoms Reported: No symptoms reported    Gastrointestinal Gastrointestinal Symptoms Reported: Constipation, Reflux/heartburn Additional Gastrointestinal Details: Therapeutic opioid-induced constipation - takes stool softeners Gastrointestinal Management Strategies: Adequate rest, Bowel program, Coping strategies Gastrointestinal Self-Management Outcome: 4 (good)    Genitourinary Genitourinary Symptoms Reported: No symptoms reported    Integumentary Integumentary Symptoms Reported: No symptoms reported    Musculoskeletal Musculoskelatal Symptoms Reviewed: Weakness, Limited mobility Musculoskeletal Management Strategies: Medical device, Coping strategies, Adequate rest, Routine screening Musculoskeletal Self-Management Outcome: 3 (uncertain) Musculoskeletal Comment: Takes Prolia  injections for Osteoporosis Falls in the past year?: Yes Number of falls in past year: 1 or less Was there an injury with Fall?: No Fall Risk Category Calculator: 1 Patient Fall Risk Level: Low Fall Risk    Psychosocial Psychosocial Symptoms Reported: Anxiety - if selected complete GAD, Depression - if  selected complete PHQ 2-9  01/19/2024    2:14 PM  Depression screen PHQ 2/9  Decreased Interest 3  Down, Depressed, Hopeless 2  PHQ - 2 Score 5  Altered sleeping 2  Tired, decreased energy 3  Feeling bad or failure about yourself  0  Moving slowly or fidgety/restless 0  Suicidal thoughts 0  PHQ-9 Score 10  Difficult doing work/chores Very difficult    There were no vitals filed for this visit.  Medications Reviewed Today     Reviewed by Gordy Channing LABOR, RN (Registered Nurse) on 02/15/24 at 1615  Med List Status: <None>   Medication Order Taking? Sig Documenting Provider Last Dose Status Informant  acetaminophen  (TYLENOL ) 500 MG tablet [id number] Yes Take 500 mg by mouth every 6 (six) hours as needed (pain.). [provider]  Active Self  amiodarone  (PACERONE ) 200 MG tablet [id number] Yes Take 200 mg by mouth daily. [provider]  Active   aspirin  EC 81 MG tablet [id number] Yes Take 1 tablet (81 mg total) by mouth daily. Swallow whole. Pietro Redell RAMAN, MD  Active   calcium  carbonate (OS-CAL) 1250 (500 Ca) MG chewable tablet [id number] Yes Chew 1 tablet by mouth every evening. [provider]  Active Self  Calcium  Carbonate Antacid (TUMS CHEWY BITES PO) [id number] Yes Take 1 tablet by mouth daily as needed (reflux).  [provider]  Active Self  Carboxymethylcellul-Glycerin  (LUBRICATING EYE DROPS OP) [id number] Yes Place 1 drop into both eyes 3 (three) times daily as needed (dry/irritated eys.). [provider]  Active Self  Cholecalciferol (VITAMIN D -3 PO) [id number] Yes Take 1 tablet by mouth every evening. [provider]  Active Self  clonazePAM  (KLONOPIN ) 0.5 MG tablet [id number] Yes Take 0.5 tablets (0.25 mg total) by mouth 2 (two) times daily as needed for anxiety. [name], MD  Active   cyanocobalamin  (VITAMIN B12) 1000 MCG/ML injection [id number] Yes Inject 1,000 mcg into the muscle every 30  (thirty) days. [provider]  Active Self  denosumab  (PROLIA ) 60 MG/ML SOSY injection [id number] Yes Inject 60 mg into the skin every 6 (six) months. [provider]  Active Self           Med Note JACKOLYN APOLINAR JONELLE Pablo Jun 11, 2020  2:33 PM)    furosemide  (LASIX ) 20 MG tablet [id number] Yes Take 20 mg by mouth every Tuesday, Thursday, and Saturday at 6 PM. Per patient taking 60 mg on these days [provider]  Active Self  furosemide  (LASIX ) 40 MG tablet [id number] Yes Take 1 tablet (40 mg total) by mouth daily. Pietro Redell RAMAN, MD  Active   levalbuterol  (XOPENEX  HFA) 45 MCG/ACT inhaler [id number] Yes Inhale 2 puffs into the lungs every 4 (four) hours as needed for wheezing. Kara Dorn NOVAK, MD  Active   levothyroxine  (SYNTHROID ) 75 MCG tablet [id number] Yes Take 1 tablet (75 mcg total) by mouth daily. [name], MD  Active   loratadine (CLARITIN) 10 MG tablet [id number] Yes Take 10 mg by mouth in the morning. [provider]  Active Self  metoprolol  tartrate (LOPRESSOR ) 25 MG tablet [id number] Yes Take 1 tablet by mouth twice daily Pietro Redell RAMAN, MD  Active Self  mupirocin ointment (BACTROBAN) 2 % [id number] Yes Apply 1 Application topically daily. [provider]  Active Self  nystatin  (MYCOSTATIN /NYSTOP ) powder [id number] Yes Apply topically 2 (two) times daily as needed (skin irritation (under breasts)). Maurice Sharlet RAMAN, PA-C  Active Self  ondansetron  (ZOFRAN ) 8 MG tablet [id number]  Yes Take 1 tablet (8 mg total) by mouth every 8 (eight) hours as needed for nausea or vomiting. Tabori, Katherine E, MD  Active Self  ondansetron  (ZOFRAN -ODT) 4 MG disintegrating tablet [id number] Yes Take 1 tablet (4 mg total) by mouth every 8 (eight) hours as needed for nausea or vomiting. Ruthe Cornet, DO  Active   oxyCODONE  (ROXICODONE ) 5 MG/5ML solution [id number] Yes Take 4 mLs (4 mg total) by mouth every 4 (four) hours as needed for moderate pain.  Maurice Sharlet RAMAN, PA-C  Active Self           Med Note JACKOLYN APOLINAR JONELLE Pablo Jun 11, 2020  2:33 PM)    pantoprazole  (PROTONIX ) 40 MG tablet [id number] Yes Take 1 tablet by mouth once daily Dewald, Jonathan B, MD  Active Self  polyethylene glycol (MIRALAX  / GLYCOLAX ) packet [id number] Yes Take 17 g by mouth daily as needed for mild constipation. [provider]  Active Self  potassium chloride  SA (KLOR-CON  M) 20 MEQ tablet [id number] Yes Take 1 tablet (20 mEq total) by mouth 3 (three) times a week. Take on Tuesdays, Thursdays and Saturdays when you take Lasix  60 mg. West, Katlyn D, NP  Active   revefenacin  (YUPELRI ) 175 MCG/3ML nebulizer solution [id number] Yes Inhale one vial in nebulizer once daily. Do not mix with other nebulized medications. Kara Dorn NOVAK, MD  Active   sodium chloride  (OCEAN) 0.65 % SOLN nasal spray [id number] Yes Place 1 spray into both nostrils as needed for congestion (nose bleeds). [provider]  Active Self  Ubrogepant (UBRELVY) 100 MG TABS [id number] Yes Take 25 mg by mouth daily as needed (headaches/migraines). [provider]  Active Self  valACYclovir  (VALTREX ) 500 MG tablet [id number] Yes Take 500 mg by mouth daily as needed (breakouts). [provider]  Active Self            Recommendation:   Continue Current Plan of Care  Follow Up Plan:   Telephone follow up appointment date/time:  02/29/24 at 10 am with [facility] and Telephone counseling appointment with LCSW Lyle Rung scheduled for 02/23/24  Channing A. Gordy RN, BA, [facility], CRRN [place]  [facility] Population Health RN Care Manager Direct Dial: [phone]  Fax: [phone]

## 2024-02-17 DIAGNOSIS — H25813 Combined forms of age-related cataract, bilateral: Secondary | ICD-10-CM | POA: Diagnosis not present

## 2024-02-17 DIAGNOSIS — H35372 Puckering of macula, left eye: Secondary | ICD-10-CM | POA: Diagnosis not present

## 2024-02-18 DIAGNOSIS — M7912 Myalgia of auxiliary muscles, head and neck: Secondary | ICD-10-CM | POA: Diagnosis not present

## 2024-02-18 DIAGNOSIS — M5412 Radiculopathy, cervical region: Secondary | ICD-10-CM | POA: Diagnosis not present

## 2024-02-18 DIAGNOSIS — G5603 Carpal tunnel syndrome, bilateral upper limbs: Secondary | ICD-10-CM | POA: Diagnosis not present

## 2024-02-18 DIAGNOSIS — R9082 White matter disease, unspecified: Secondary | ICD-10-CM | POA: Diagnosis not present

## 2024-02-18 DIAGNOSIS — Z8673 Personal history of transient ischemic attack (TIA), and cerebral infarction without residual deficits: Secondary | ICD-10-CM | POA: Diagnosis not present

## 2024-02-22 ENCOUNTER — Ambulatory Visit (INDEPENDENT_AMBULATORY_CARE_PROVIDER_SITE_OTHER): Admitting: Family Medicine

## 2024-02-22 ENCOUNTER — Encounter: Payer: Self-pay | Admitting: Family Medicine

## 2024-02-22 DIAGNOSIS — F32A Depression, unspecified: Secondary | ICD-10-CM | POA: Diagnosis not present

## 2024-02-22 DIAGNOSIS — F419 Anxiety disorder, unspecified: Secondary | ICD-10-CM | POA: Diagnosis not present

## 2024-02-22 MED ORDER — CLONAZEPAM 0.5 MG PO TABS: 0.2500 mg | ORAL_TABLET | Freq: Two times a day (BID) | ORAL | 3 refills | Status: AC | PRN

## 2024-02-22 NOTE — Progress Notes (Signed)
   Subjective:    Patient ID: [name], female    DOB: 1946-02-18, 78 y.o.   MRN: [id number]  HPI Anxiety- Pt saw Dr Jerrell x3 during July.  The first visit, she was dealing w/ dizzines.  The 2nd visit, he declined to fill her Clonazepam .  This was very upsetting for pt as she has long struggled w/ severe, debilitating anxiety and is not able to tolerate SSRIs/SNRIs/Wellbutrin/etc.  At the 3rd visit, he decided it was prudent to restart Clonazepam .  She was upset that Dr Jerrell mentioned end of life and complex medical issues and assisted living.  She is asking how much time I think she has left  Dizziness- has seen Neurology since her last appt.  Had MRI brain.  Waiting on results.   Review of Systems For ROS see HPI     Objective:   Physical Exam Vitals reviewed.  Constitutional:      General: She is not in acute distress.    Appearance: Normal appearance.     Comments: Coloring is better and she is more perky than usual  HENT:     Head: Normocephalic and atraumatic.  Eyes:     Extraocular Movements: Extraocular movements intact.     Conjunctiva/sclera: Conjunctivae normal.  Cardiovascular:     Rate and Rhythm: Normal rate and regular rhythm.  Pulmonary:     Effort: Pulmonary effort is normal. No respiratory distress.  Skin:    General: Skin is warm and dry.  Neurological:     Mental Status: She is alert and oriented to person, place, and time. Mental status is at baseline.  Psychiatric:     Comments: Very anxious           Assessment & Plan:

## 2024-02-22 NOTE — Patient Instructions (Signed)
 Follow up as needed or as scheduled I'm glad that your Neurology appt is upcoming I think you are doing [facility] better than you were before! Continue the Clonazepam  as needed for anxiety Call with any questions or concerns Hang in there!!!

## 2024-02-23 ENCOUNTER — Other Ambulatory Visit: Payer: Self-pay | Admitting: Licensed Clinical Social Worker

## 2024-02-23 NOTE — Patient Outreach (Signed)
 Complex Care Management   Visit Note  02/23/2024  Name:  [name] MRN: [id number] DOB: October 26, 1945  Situation: Patient answered phone call but stated that she forgot about today's scheduled appointment and is currently on the road traveling and unable to talk. Patient reports that she can call back later in the day once she has her calendar. Patient returned call successfully and appointment was successfully rescheduled for next week. Patient wrote this appointment in her calendar  Follow Up Plan:   Telephone follow-up in 1 week  Lyle Rung, BSW, MSW, LCSW Licensed Clinical Social Worker [employer]   [facility] [email or web address] Direct Dial: [phone]

## 2024-02-25 ENCOUNTER — Other Ambulatory Visit: Payer: Self-pay | Admitting: Family Medicine

## 2024-02-25 ENCOUNTER — Ambulatory Visit: Admitting: Pulmonary Disease

## 2024-02-25 ENCOUNTER — Encounter: Payer: Self-pay | Admitting: Pulmonary Disease

## 2024-02-25 VITALS — BP 102/70 | HR 60 | Ht 66.0 in

## 2024-02-25 DIAGNOSIS — G4734 Idiopathic sleep related nonobstructive alveolar hypoventilation: Secondary | ICD-10-CM | POA: Diagnosis not present

## 2024-02-25 DIAGNOSIS — R079 Chest pain, unspecified: Secondary | ICD-10-CM | POA: Diagnosis not present

## 2024-02-25 DIAGNOSIS — R0609 Other forms of dyspnea: Secondary | ICD-10-CM

## 2024-02-25 NOTE — Progress Notes (Unsigned)
 Synopsis: Referred in August 2023 for abnormal CT Chest scan  Subjective:   PATIENT ID: [name] Brought GENDER: female DOB: Jul 21, 1945, MRN: [id number]  HPI  Chief Complaint  Patient presents with   Medical Management of Chronic Issues    Pt states headaches / pain through chest +back, cardiology said ok nothing found    [name] is a 78 year old woman with history of mitral regurgitation s/p minimally invassive MV repair and MAZE procedure complicated by LV free wall rupture 10/2019 and scoliosis with 7 spinal surgeries in 2015 who returns to pulmonary clinic for mild bronchiectasis and dyspnea.   She continues to have dyspnea, headaches, dizziness and chest pain that radiates to her back. She is using yupelri  nebs daily. She continues to have reflux and is taking pantoprazole .   OV 05/2023 Patient scheduled visit to review her pulmonary issues again. We discussed her bronchiectasis which is mild and reviewed her CT chest scans. We reviewed that she is on yupelri  nebulizer treatments for the bronchiectasis and also she had a significant bronchodilator response on PFTs in the past.   She does complain of troubling swallowing food and drink, more so of recent.  She has recently started seeing a counselor for her anxiety/depression.  OV 02/17/23 She canceled her in lab CPAP titration study due to panic/anxiety.  She expresses concern for her debilitating anxiety/depression and feels that it is worse than ever before.  She has been using Yupelri  nebulizer treatments as needed for her shortness of breath.   OV 09/23/22 She was started on yupelri  nebulizer treatments at last visit in attempt to help with her dyspnea given significant bronchodilator response on PFTs. She noted an improvement in her breathing with the nebulizer treatment.   ONO showed 1 hr 51 minutes with SpO2 less than 88%. Her oxygen desaturation index was 37 events per hour. Studies have shown that the ODI is a high predictor  of sleep disordered breathing such as sleep apnea. Given her medications and these results, I recommended she be scheduled for an in lab sleep study for further evaluation.   She had issue of epistaxis that she went to the ER on 08/18/22 and had packing in for about 4-5 days. This occurred 2-3 weeks after starting the nebulizer treatments. She has had issues with epistaxis in the past.   OV 07/24/22 She continues to have shortness of breath and she feels it is getting worse. She did not tolerate Stiolto inhaler after last visit due to feeling like she had mucous in her throat. She continues to have issues with swallowing and has been referred to speech therapy by her primary care team.  OV 04/02/22 She did not tolerate stiolto after last visit as she reports choking issues after using it.  She had modified barium swallow 03/11/22 with no overt issues of dysphagia or aspiration risk. She reported taking TUMS to the speech therapist for chest pains with relief. She did start pantoprazole  after last visit which she reports some improvement in her reflux.  She continues to have significant chest, neck, shoulder and back pains.   She is meeting with her surgical team at [facility] and is considering botox injections in her neck.  OV 03/05/22 She reports the dyspnea can occur at rest or with exertion. She has wheezing at night sometimes. She complains of sharp chest pains that are located centrally and goes between her shoulder blades. She has terrible heartburn. She does report trouble swallowing her food/drink.  She is using oxycodone  every 4 hours for chronic pain due to her neck and back. She has cervical, thoracic and lumbar spine hardware in place.   PFTs 2022 showed normal spirometry but evidence of air trapping and significant bronchodilator response.   She has not noted much benefit from trying albuterol  inhaler with her shortness of breath.   She is a former smoker, quit in 1980. She lives alone  and is accompanied by her sister today. She is having more difficulty with her ADLs.   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   Mitral regurgitation    Osteopenia 02/18/2012   Osteoporosis    PAF (paroxysmal atrial fibrillation) (HCC) 2012   Peripheral neuropathy 11/06/2011   PMDD (premenstrual dysphoric disorder)    PONV (postoperative nausea and vomiting)    nausea, vomiting, hives and dizziness    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/2020   Complete bilateral atrial lesion set using cryothermy and bipolar radiofrequency ablation with clipping of LA appendage via right mini-thoracotomy approach   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   Complex valvuloplasty including artificial Gore-tex neochord placement x12 with 32mm Sorin Memo 4D ring annuloplasty   Seasonal allergies    Vaginal delivery    ONE NSVD   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     Family History  Problem Relation Age of Onset   Diabetes Father    Hyperlipidemia Sister    Heart disease Sister    Stroke Sister    Diabetes Brother    Hyperlipidemia Sister    Heart disease Sister    Arthritis Mother    Heart disease Mother    Uterine cancer Mother    Diabetes Brother    Heart Problems Brother      Social History   Socioeconomic History   Marital status: Divorced    Spouse name: none/divorced   Number of children: Not on file   Years of education: Not on file   Highest education level: 12th  grade  Occupational History   Occupation: retired  Tobacco Use   Smoking status: Former    Current packs/day: 0.00    Average packs/day: 0.1 packs/day for 14.0 years (1.4 ttl pk-yrs)    Types: Cigarettes    Start date: 1966    Quit date: 1980    Years since quitting: 45.6    Passive exposure: Past   Smokeless tobacco: Never   Tobacco comments:    Former smoker 12/07/23  Vaping Use   Vaping status: Never Used  Substance and Sexual Activity   Alcohol  use: Never   Drug use: Yes    Types: Oxycodone , Benzodiazepines    Comment: 03/31/2018 for chronic neck and back pain, takes Klonopin  at times.    Sexual activity: Not Currently  Other Topics Concern   Not on file  Social History Narrative   Lives by herself.  Social Drivers of Health   Financial Resource Strain: Medium Risk (10/12/2023)   Overall Financial Resource Strain (CARDIA)    Difficulty of Paying Living Expenses: Somewhat hard  Food Insecurity: No Food Insecurity (02/02/2024)   Hunger Vital Sign    Worried About Running Out of Food in the Last Year: Never true    Ran Out of Food in the Last Year: Never true  Transportation Needs: No Transportation Needs (02/03/2024)   PRAPARE - Administrator, Civil Service (Medical): No    Lack of Transportation (Non-Medical): No  Physical Activity: Inactive (10/23/2022)   Exercise Vital Sign    Days of Exercise per Week: 0 days    Minutes of Exercise per Session: 0 min  Stress: Stress Concern Present (10/23/2022)   [employer] of Occupational Health - Occupational Stress Questionnaire    Feeling of Stress : To some extent  Social Connections: Socially Isolated (10/23/2022)   Social Connection and Isolation Panel    Frequency of Communication with Friends and Family: More than three times a week    Frequency of Social Gatherings with Friends and Family: Once a week    Attends Religious Services: Never    Database administrator or Organizations: No    Attends Occupational hygienist Meetings: Never    Marital Status: Divorced  Catering manager Violence: Not At Risk (02/02/2024)   Humiliation, Afraid, Rape, and Kick questionnaire    Fear of Current or Ex-Partner: No    Emotionally Abused: No    Physically Abused: No    Sexually Abused: No     Allergies  Allergen Reactions   Buprenorphine Nausea Only and Other (See Comments)    sedation and adhesive reaction--patches    Erythromycin Nausea And Vomiting and Other (See Comments)    Dizziness     Iodinated Contrast Media Hives, Nausea Only and Rash   Latex Palpitations, Rash and Other (See Comments)    Hives/sores   Other Other (See Comments)    CAN NOT TAKE , aLPRAZOLAM, OR ELAVIL DUE TO AFIB FOR ANXIETY   Steroids-jittery/hyper wants to crawl out of skin   Tape Hives, Itching, Rash and Other (See Comments)    Heart monitor stickers must be rotated in order to prevent rash Adhesive on EKG tabs=BURNS SKIN**PAPER TAPE OK** sores     Ciprofloxacin  Other (See Comments)    Burning all over   Duloxetine  Hcl Other (See Comments)    Severe diarrhea and upset stomach   Gabapentin  Other (See Comments)    Unknown--unsteady gait/felt intoxicated   Hydrocodone Other (See Comments)    Sedation,dizziness, and nausea   Hydromorphone  Nausea Only and Other (See Comments)    Sedation,dizziness, and nausea   Iodine Hives   Levofloxacin Nausea And Vomiting and Nausea Only   Metrizamide Hives   Nsaids     CANNOT TAKE PER CARDIOLOGIST DUE TO AFIB    Nucynta [Tapentadol Hcl] Nausea And Vomiting and Other (See Comments)    nausea and sedation   Oxycodone  Itching, Nausea Only and Other (See Comments)    Delusions (intolerance)  PILLS ONLY sedation, dizziness, nausea,  and itching   Pentazocine Other (See Comments)    Unknown    Pregabalin Nausea Only    Unknown    Sulfasalazine Hives   Morphine Nausea Only, Anxiety and Other (See Comments)    sedation and nausea delusions (intolerance) PT CANNOT  WAKE UP AFTER TAKING MEDICATION AND HAS NIGHTMARES     Sulfa  Antibiotics Hives, Nausea Only and Rash   Sulfamethoxazole-Trimethoprim Hives and Rash     Outpatient Medications Prior to Visit  Medication Sig Dispense Refill   acetaminophen  (TYLENOL ) 500 MG tablet Take 500 mg by mouth every 6 (six) hours as needed (pain.).     amiodarone  (PACERONE ) 200 MG tablet Take 200 mg by mouth daily.     aspirin  EC 81 MG tablet Take 1 tablet (81 mg total) by mouth daily. Swallow whole.     calcium  carbonate (OS-CAL) 1250 (500 Ca) MG chewable tablet Chew 1 tablet by mouth every evening.     Calcium  Carbonate Antacid (TUMS CHEWY BITES PO) Take 1 tablet by mouth daily as needed (reflux).      Carboxymethylcellul-Glycerin  (LUBRICATING EYE DROPS OP) Place 1 drop into both eyes 3 (three) times daily as needed (dry/irritated eys.).     Cholecalciferol (VITAMIN D -3 PO) Take 1 tablet by mouth every evening.     clonazePAM  (KLONOPIN ) 0.5 MG tablet Take 0.5 tablets (0.25 mg total) by mouth 2 (two) times daily as needed for anxiety. 30 tablet 3   cyanocobalamin  (VITAMIN B12) 1000 MCG/ML injection Inject 1,000 mcg into the muscle every 30 (thirty) days.     denosumab  (PROLIA ) 60 MG/ML SOSY injection Inject 60 mg into the skin every 6 (six) months.     furosemide  (LASIX ) 20 MG tablet Take 20 mg by mouth every Tuesday, Thursday, and Saturday at 6 PM. Per patient taking 60 mg on these days     furosemide  (LASIX ) 40 MG tablet Take 1 tablet (40 mg total) by mouth daily. 90 tablet 3   levalbuterol  (XOPENEX  HFA) 45 MCG/ACT inhaler Inhale 2 puffs into the lungs every 4 (four) hours as needed for wheezing. 1 each 6   levothyroxine  (SYNTHROID ) 75 MCG tablet Take 1 tablet (75 mcg total) by mouth daily. 90 tablet 0   loratadine (CLARITIN) 10 MG tablet Take 10 mg by mouth in the morning.     metoprolol  tartrate (LOPRESSOR ) 25 MG tablet Take 1 tablet by mouth twice daily 180 tablet 3   mupirocin ointment (BACTROBAN) 2 % Apply 1  Application topically daily.     nystatin  (MYCOSTATIN /NYSTOP ) powder Apply topically 2 (two) times daily as needed (skin irritation (under breasts)). 15 g 0   ondansetron  (ZOFRAN ) 8 MG tablet Take 1 tablet (8 mg total) by mouth every 8 (eight) hours as needed for nausea or vomiting. 45 tablet 1   ondansetron  (ZOFRAN -ODT) 4 MG disintegrating tablet Take 1 tablet (4 mg total) by mouth every 8 (eight) hours as needed for nausea or vomiting. 20 tablet 0   oxyCODONE  (ROXICODONE ) 5 MG/5ML solution Take 4 mLs (4 mg total) by mouth every 4 (four) hours as needed for moderate pain.  0   pantoprazole  (PROTONIX ) 40 MG tablet Take 1 tablet by mouth once daily 30 tablet 3   polyethylene glycol (MIRALAX  / GLYCOLAX ) packet Take 17 g by mouth daily as needed for mild constipation.     potassium chloride  SA (KLOR-CON  M) 20 MEQ tablet Take 1 tablet (20 mEq total) by mouth 3 (three) times a week. Take on Tuesdays, Thursdays and Saturdays when you take Lasix  60 mg.     revefenacin  (YUPELRI ) 175 MCG/3ML nebulizer solution Inhale one vial in nebulizer once daily. Do not mix with other nebulized medications. 3 mL 11   sodium chloride  (OCEAN) 0.65 % SOLN nasal spray Place 1 spray into both nostrils as needed for congestion (nose bleeds).     Ubrogepant (  UBRELVY) 100 MG TABS Take 25 mg by mouth daily as needed (headaches/migraines).     valACYclovir  (VALTREX ) 500 MG tablet Take 500 mg by mouth daily as needed (breakouts).     No facility-administered medications prior to visit.   Review of Systems  Constitutional:  Negative for chills, fever, malaise/fatigue and weight loss.  HENT:  Negative for congestion, sinus pain and sore throat.   Eyes: Negative.   Respiratory:  Positive for shortness of breath. Negative for cough, hemoptysis, sputum production and wheezing.   Cardiovascular:  Negative for chest pain, palpitations, orthopnea, claudication and leg swelling.  Gastrointestinal:  Negative for abdominal pain,  heartburn, nausea and vomiting.  Genitourinary: Negative.   Musculoskeletal:  Negative for joint pain and myalgias.  Skin:  Negative for rash.  Neurological:  Positive for headaches. Negative for weakness.  Endo/Heme/Allergies: Negative.    Objective:   Vitals:   02/25/24 1457  BP: 102/70  Pulse: 60  SpO2: 91%  Height: 5' 6 (1.676 m)    Physical Exam Constitutional:      General: She is not in acute distress.    Appearance: She is not ill-appearing.  HENT:     Head: Normocephalic and atraumatic.  Eyes:     General: No scleral icterus. Cardiovascular:     Rate and Rhythm: Normal rate and regular rhythm.     Pulses: Normal pulses.     Heart sounds: Normal heart sounds. No murmur heard. Pulmonary:     Effort: Pulmonary effort is normal.     Breath sounds: Normal breath sounds. No wheezing, rhonchi or rales.  Musculoskeletal:     Right lower leg: No edema.     Left lower leg: No edema.  Skin:    General: Skin is warm and dry.  Neurological:     General: No focal deficit present.     Mental Status: She is alert.    CBC    Component Value Date/Time   WBC 6.7 01/15/2024 1317   RBC 4.07 01/15/2024 1317   HGB 12.5 01/15/2024 1317   HGB 13.1 10/21/2023 1617   HCT 39.5 01/15/2024 1317   HCT 39.7 10/21/2023 1617   PLT 155 01/15/2024 1317   PLT 197 10/21/2023 1617   MCV 97.1 01/15/2024 1317   MCV 91 10/21/2023 1617   MCH 30.7 01/15/2024 1317   MCHC 31.6 01/15/2024 1317   RDW 13.5 01/15/2024 1317   RDW 13.1 10/21/2023 1617   LYMPHSABS 0.6 (L) 12/06/2023 1200   MONOABS 0.5 12/06/2023 1200   EOSABS 0.2 12/06/2023 1200   BASOSABS 0.0 12/06/2023 1200      Latest Ref Rng & Units 01/15/2024    1:17 PM 12/17/2023   11:35 AM 12/06/2023   12:00 PM  BMP  Glucose 70 - 99 mg/dL 898  891  96   BUN 8 - 23 mg/dL 23  31  28    Creatinine 0.44 - 1.00 mg/dL 9.02  9.05  9.00   BUN/Creat Ratio 12 - 28  33    Sodium 135 - 145 mmol/L 140  144  145   Potassium 3.5 - 5.1 mmol/L 3.9   4.8  4.2   Chloride 98 - 111 mmol/L 100  103  103   CO2 22 - 32 mmol/L 30  24  33   Calcium  8.9 - 10.3 mg/dL 8.8  9.1  9.5    Chest imaging: CT Chest 02/13/22 1. Right middle lobe and left upper lobe tree-in-bud nodularity consistent with atypical infection.  2. Interval development of a 6x4 mm subpleural nodule along the left major fissure. No follow-up needed if patient is low-risk.This recommendation follows the consensus statement: Guidelines for Management of Incidental Pulmonary Nodules Detected on CT Images: From the Fleischner Society 2017; Radiology 2017; 284:228-243. 3. Cardiomegaly. 4. Mitral valve replacement.  PFT:    Latest Ref Rng & Units 01/01/2021   11:49 AM 12/01/2019    9:39 AM  PFT Results  FVC-Pre L 1.55  2.13   FVC-Predicted Pre % 51  69   FVC-Post L 1.67    FVC-Predicted Post % 55    Pre FEV1/FVC % % 74  74   Post FEV1/FCV % % 81    FEV1-Pre L 1.15  1.57   FEV1-Predicted Pre % 50  68   FEV1-Post L 1.36    DLCO uncorrected ml/min/mmHg 21.48  14.09   DLCO UNC% % 105  68   DLCO corrected ml/min/mmHg 22.12  14.56   DLCO COR %Predicted % 108  71   DLVA Predicted % 110  105   TLC L 4.50  5.36   TLC % Predicted % 84  100   RV % Predicted % 131  140     Labs:  Path:  Echo 02/11/22: LV EF 50-55%. RV systolic function is moderately reduced. RV size is moderately enlarged. Mildly elevated pulmonary artery systolic pressure 44.74mmHg. RA mildly dilated.  Heart Catheterization:  Assessment & Plan:   Nocturnal hypoxemia - Plan: Pulse oximetry, overnight  DOE (dyspnea on exertion)  Chest pain, unspecified type  Discussion: [name] is a 78 year old woman with history of mitral regurgitation s/p minimally invassive MV repair and MAZE procedure complicated by LV free wall rupture 10/2019 and scoliosis with 7 spinal surgeries in 2015 who returns to pulmonary clinic for shortness of breath and bronchiectasis.  Her bronchiectasis is very mild based on CT  imaging. This is not the etiology of her shortness of breath. PFTs are within normal limits with positive bronchodilator response. She is to continue yupelri  nebulizer treatment daily.  There appears to be a component of diastolic heart failure given elevated BNP in the past.   She has moderate obstructive sleep apnea based on home sleep study 01/13/2023 and was recommended for in lab CPAP titration due to her history of nocturnal hypoxemia. She does not wish to try CPAP therapy. Will obtain ONO on room air and prescribe nocturnal oxygen therapy if required. Concern for headaches could be related to untreated sleep apnea.  Follow up in 6 months.  Dorn Chill, MD [facility] Office: [phone]    Current Outpatient Medications:    acetaminophen  (TYLENOL ) 500 MG tablet, Take 500 mg by mouth every 6 (six) hours as needed (pain.)., Disp: , Rfl:    amiodarone  (PACERONE ) 200 MG tablet, Take 200 mg by mouth daily., Disp: , Rfl:    aspirin  EC 81 MG tablet, Take 1 tablet (81 mg total) by mouth daily. Swallow whole., Disp: , Rfl:    calcium  carbonate (OS-CAL) 1250 (500 Ca) MG chewable tablet, Chew 1 tablet by mouth every evening., Disp: , Rfl:    Calcium  Carbonate Antacid (TUMS CHEWY BITES PO), Take 1 tablet by mouth daily as needed (reflux). , Disp: , Rfl:    Carboxymethylcellul-Glycerin  (LUBRICATING EYE DROPS OP), Place 1 drop into both eyes 3 (three) times daily as needed (dry/irritated eys.)., Disp: , Rfl:    Cholecalciferol (VITAMIN D -3 PO), Take 1 tablet by mouth every evening., Disp: ,  Rfl:    clonazePAM  (KLONOPIN ) 0.5 MG tablet, Take 0.5 tablets (0.25 mg total) by mouth 2 (two) times daily as needed for anxiety., Disp: 30 tablet, Rfl: 3   cyanocobalamin  (VITAMIN B12) 1000 MCG/ML injection, Inject 1,000 mcg into the muscle every 30 (thirty) days., Disp: , Rfl:    denosumab  (PROLIA ) 60 MG/ML SOSY injection, Inject 60 mg into the skin every 6 (six) months., Disp: , Rfl:     furosemide  (LASIX ) 20 MG tablet, Take 20 mg by mouth every Tuesday, Thursday, and Saturday at 6 PM. Per patient taking 60 mg on these days, Disp: , Rfl:    furosemide  (LASIX ) 40 MG tablet, Take 1 tablet (40 mg total) by mouth daily., Disp: 90 tablet, Rfl: 3   levalbuterol  (XOPENEX  HFA) 45 MCG/ACT inhaler, Inhale 2 puffs into the lungs every 4 (four) hours as needed for wheezing., Disp: 1 each, Rfl: 6   levothyroxine  (SYNTHROID ) 75 MCG tablet, Take 1 tablet (75 mcg total) by mouth daily., Disp: 90 tablet, Rfl: 0   loratadine (CLARITIN) 10 MG tablet, Take 10 mg by mouth in the morning., Disp: , Rfl:    metoprolol  tartrate (LOPRESSOR ) 25 MG tablet, Take 1 tablet by mouth twice daily, Disp: 180 tablet, Rfl: 3   mupirocin ointment (BACTROBAN) 2 %, Apply 1 Application topically daily., Disp: , Rfl:    nystatin  (MYCOSTATIN /NYSTOP ) powder, Apply topically 2 (two) times daily as needed (skin irritation (under breasts))., Disp: 15 g, Rfl: 0   ondansetron  (ZOFRAN ) 8 MG tablet, Take 1 tablet (8 mg total) by mouth every 8 (eight) hours as needed for nausea or vomiting., Disp: 45 tablet, Rfl: 1   ondansetron  (ZOFRAN -ODT) 4 MG disintegrating tablet, Take 1 tablet (4 mg total) by mouth every 8 (eight) hours as needed for nausea or vomiting., Disp: 20 tablet, Rfl: 0   oxyCODONE  (ROXICODONE ) 5 MG/5ML solution, Take 4 mLs (4 mg total) by mouth every 4 (four) hours as needed for moderate pain., Disp: , Rfl: 0   pantoprazole  (PROTONIX ) 40 MG tablet, Take 1 tablet by mouth once daily, Disp: 30 tablet, Rfl: 3   polyethylene glycol (MIRALAX  / GLYCOLAX ) packet, Take 17 g by mouth daily as needed for mild constipation., Disp: , Rfl:    potassium chloride  SA (KLOR-CON  M) 20 MEQ tablet, Take 1 tablet (20 mEq total) by mouth 3 (three) times a week. Take on Tuesdays, Thursdays and Saturdays when you take Lasix  60 mg., Disp: , Rfl:    revefenacin  (YUPELRI ) 175 MCG/3ML nebulizer solution, Inhale one vial in nebulizer once daily.  Do not mix with other nebulized medications., Disp: 3 mL, Rfl: 11   sodium chloride  (OCEAN) 0.65 % SOLN nasal spray, Place 1 spray into both nostrils as needed for congestion (nose bleeds)., Disp: , Rfl:    Ubrogepant (UBRELVY) 100 MG TABS, Take 25 mg by mouth daily as needed (headaches/migraines)., Disp: , Rfl:    valACYclovir  (VALTREX ) 500 MG tablet, Take 500 mg by mouth daily as needed (breakouts)., Disp: , Rfl:   -

## 2024-02-25 NOTE — Patient Instructions (Addendum)
 Continue yupelri  nebulizer daily  We will check your oxygen levels at night as this could be leading to headaches  Follow up in 6 months, call sooner if needed

## 2024-02-25 NOTE — Telephone Encounter (Signed)
 Declining medication. Office visit note 01/19/24 states that Meclizine  is not effective.

## 2024-02-26 ENCOUNTER — Encounter: Payer: Self-pay | Admitting: Pulmonary Disease

## 2024-02-26 DIAGNOSIS — R42 Dizziness and giddiness: Secondary | ICD-10-CM | POA: Diagnosis not present

## 2024-02-26 DIAGNOSIS — R040 Epistaxis: Secondary | ICD-10-CM | POA: Diagnosis not present

## 2024-02-26 DIAGNOSIS — G43009 Migraine without aura, not intractable, without status migrainosus: Secondary | ICD-10-CM | POA: Diagnosis not present

## 2024-02-26 DIAGNOSIS — H903 Sensorineural hearing loss, bilateral: Secondary | ICD-10-CM | POA: Diagnosis not present

## 2024-02-29 ENCOUNTER — Other Ambulatory Visit: Payer: Self-pay

## 2024-02-29 NOTE — Patient Instructions (Signed)
 Visit Information  Thank you for taking time to visit with me today. Please don't hesitate to contact me if I can be of assistance to you before our next scheduled telephone appointment.  Our next appointment is by telephone on Friday 03/04/24 at 12:30 pm after your initial engagement on 03/03/24 at 1:30 pm with LCSW Brooke Joyce.  Following is a copy of your care plan:   Goals Addressed             This Visit's Progress    VBCI RN Care Plan       Problems:  Chronic Disease Management support and education needs related to High Risk for Falls  Goal: Over the next 3 months the Patient will demonstrate ongoing self health care management ability and take all Fall Precautions necessary to ensure no further falls as evidenced by    no further falls  Interventions:   Falls Interventions: Provided written and verbal education re: potential causes of falls and Fall prevention strategies Reviewed medications and discussed potential side effects of medications such as dizziness and frequent urination Advised patient of importance of notifying provider of falls Assessed for signs and symptoms of orthostatic hypotension Assessed patients knowledge of fall risk prevention secondary to previously provided education Provided patient information for fall alert systems  Patient Self-Care Activities:  Attend all scheduled provider appointments Call provider office for new concerns or questions  Perform all self care activities independently  Take medications as prescribed   Work with the social worker to address care coordination needs and will continue to work with the clinical team to address health care and disease management related needs 02/29/24 RNCM engagement - patient reports no falls since last RNCM call on 02/15/24. Continues to complain of chronic pain and takes oxycodone  as needed as well as 1/2 tab (= dose of 0.25mg ) of Klonopin  up to 2Xs/day.  Plan:  The patient has been provided  with contact information for the care management team and has been advised to call with any health related questions or concerns.  Referral to LCSW to help patient manage daily stressors better Next RN Care Management appointment with Channing is on 03/04/2024 at 12:30 pm.             Patient verbalizes understanding of instructions and care plan provided today and agrees to view in MyChart. Active MyChart status and patient understanding of how to access instructions and care plan via MyChart confirmed with patient.     The patient has been provided with contact information for the care management team and has been advised to call with any health related questions or concerns.   Please call the care guide team at [phone] if you need to cancel or reschedule your appointment.   Please call 1-800-273-TALK (toll free, 24 hour hotline) if you are experiencing a Mental Health or Behavioral Health Crisis or need someone to talk to.  Zahi Plaskett A. Gordy RN, BA, [facility], CRRN [place]  [facility] Population Health RN Care Manager Direct Dial: [phone]  Fax: [phone]

## 2024-02-29 NOTE — Patient Outreach (Signed)
 Complex Care Management   Visit Note  02/29/2024  Name:  [name] MRN: [id number] DOB: [birth year]  Situation: Referral received for Complex Care Management related to Atrial Fibrillation and Chronic Dyspnea, CHF, and declining functional status. I obtained verbal consent from Patient.  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   Mitral regurgitation    Osteopenia 02/18/2012   Osteoporosis    PAF (paroxysmal atrial fibrillation) (HCC) 2012   Peripheral neuropathy 11/06/2011   PMDD (premenstrual dysphoric disorder)    PONV (postoperative nausea and vomiting)    nausea, vomiting, hives and dizziness    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/2020   Complete bilateral atrial lesion set using cryothermy and bipolar radiofrequency ablation with clipping of LA appendage via right mini-thoracotomy approach   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   Complex valvuloplasty including artificial Gore-tex neochord placement x12 with 32mm Sorin Memo 4D ring annuloplasty   Seasonal allergies    Vaginal delivery    ONE NSVD   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    Assessment: Patient Reported Symptoms:  Cognitive Cognitive Status: Alert and oriented to person, place, and time, Normal speech and language skills, Insightful and able to interpret abstract concepts Cognitive/Intellectual Conditions Management [RPT]: None reported or  documented in medical history or problem list   Health Maintenance Behaviors: Annual physical exam Healing Pattern: Unsure Health Facilitated by: Healthy diet, Pain control, Prayer/meditation, Rest  Neurological Neurological Review of Symptoms: No symptoms reported, Other: Neurological Management Strategies: Medication therapy, Coping strategies, Routine screening Neurological Self-Management Outcome: 3 (uncertain)  HEENT HEENT Symptoms Reported: Change or loss of hearing HEENT Management Strategies: Coping strategies, Routine screening HEENT Self-Management Outcome: 3 (uncertain)    Cardiovascular Cardiovascular Symptoms Reported: No symptoms reported Cardiovascular Management Strategies: Medication therapy, Coping strategies, Adequate rest, Weight management, Diet modification, Routine screening Do You Have a Working Readable Scale?: Yes Cardiovascular Self-Management Outcome: 3 (uncertain) Cardiovascular Comment: PMH - Persistent Atrial Fibrillation, Chronic Diastolic Heart Failure, s/p Mitral Valve Replacement, s/p Maze procedure for Atrial Fibrillation, CAD, HTN, and Chronic Dyspnea  Respiratory Respiratory Symptoms Reported: Shortness of breath Other Respiratory Symptoms: Patient reported that she suffers from chronic dyspnea and gets short of breath just trying to maintain a conversation with someone Additional Respiratory Details: Has been diagnosed w/ sleep apnea but can't tolerate CPAP due to patient's c/o claustrophobia Respiratory Management Strategies: Adequate rest, Breathing techniques, Medication therapy, Coping strategies, Routine screening Respiratory Self-Management Outcome: 3 (uncertain)  Endocrine Endocrine Symptoms Reported: No symptoms reported Is patient diabetic?: No Endocrine Self-Management Outcome: 4 (good) Endocrine Comment: PMH - Hypothyroidism  Gastrointestinal Gastrointestinal Symptoms Reported: Constipation, Reflux/heartburn Additional Gastrointestinal  Details: Takes stool softeners d/t need for chronic opiod use for chronic pain Gastrointestinal Management Strategies: Adequate rest, Bowel program, Coping strategies Gastrointestinal Self-Management Outcome: 4 (good)  Genitourinary Genitourinary Symptoms Reported: No symptoms reported    Integumentary Integumentary Symptoms Reported: No symptoms reported    Musculoskeletal Musculoskelatal Symptoms Reviewed: Weakness, Limited mobility Musculoskeletal Management Strategies: Medical device, Coping strategies, Adequate rest, Routine screening Musculoskeletal Self-Management Outcome: 3 (uncertain) Musculoskeletal Comment: Taking Prolia  injections for osteoporosis Falls in the past year?: No Number of falls in past year: 1 or less Was there an injury with Fall?: No Fall Risk Category Calculator: 0 Patient Fall Risk Level: Low Fall Risk Patient at Risk for Falls Due to: History of fall(s), Medication side effect, Impaired mobility Fall risk Follow up: Falls evaluation completed, Education provided, Falls prevention discussed  Psychosocial Psychosocial Symptoms Reported: Anxiety - if selected complete GAD Behavioral Management Strategies: Coping strategies, Medication therapy Behavioral Health Self-Management Outcome: 3 (uncertain) Behavioral Health Comment: Has scheduled appt with LCSW Lyle Rung on 03/03/24 at 1:30 pm for initial assessment for counseling and SDOH concers Major Change/Loss/Stressor/Fears (CP): Medical condition, family, Medical condition, self, Resources Behaviors When Feeling Stressed/Fearful: States she does not have much good happening in her life right now but is hopeful that her issues with her declining health and chronic pain will improve Techniques to Cope with Loss/Stress/Change: Medication, Diversional activities Quality of Family Relationships: helpful, supportive Do you feel physically threatened by others?: No    02/29/2024    PHQ2-9 Depression Screening    Little interest or pleasure in doing things    Feeling down, depressed, or hopeless    PHQ-2 - Total Score    Trouble falling or staying asleep, or sleeping too much    Feeling tired or having little energy    Poor appetite or overeating     Feeling bad about yourself - or that you are a failure or have let yourself or your family down    Trouble concentrating on things, such as reading the newspaper or watching television    Moving or speaking so slowly that other people could have noticed.  Or the opposite - being so fidgety or restless that you have been moving around a lot more than usual    Thoughts that you would be better off dead, or hurting yourself in some way    PHQ2-9 Total Score    If you checked off any problems, how difficult have these problems made it for you to do your work, take care of things at home, or get along with other people    Depression Interventions/Treatment      There were no vitals filed for this visit.  Medications Reviewed Today     Reviewed by Gordy Channing LABOR, RN (Registered Nurse) on 02/29/24 at 1031  Med List Status: <None>   Medication Order Taking? Sig Documenting Provider Last Dose Status Informant  acetaminophen  (TYLENOL ) 500 MG tablet [id number] Yes Take 500 mg by mouth every 6 (six) hours as needed (pain.). [provider]  Active Self  amiodarone  (PACERONE ) 200 MG tablet [id number] Yes Take 200 mg by mouth daily. [provider]  Active   aspirin  EC 81 MG tablet [id number] Yes Take 1 tablet (81 mg total) by mouth daily. Swallow whole. Pietro Redell RAMAN, MD  Active   calcium  carbonate (OS-CAL) 1250 (500 Ca) MG chewable tablet [id number] Yes Chew 1 tablet by mouth every evening. [provider]  Active Self  Calcium  Carbonate Antacid (TUMS CHEWY BITES PO) [id number] Yes Take 1 tablet by mouth daily as needed (reflux).  [provider]  Active Self  Carboxymethylcellul-Glycerin  (LUBRICATING EYE DROPS OP)  [id number] Yes Place 1 drop into both eyes 3 (three) times daily as needed (dry/irritated eys.). [provider]  Active Self  Cholecalciferol (VITAMIN D -3 PO) [id number] Yes Take 1 tablet by mouth every evening. [provider]  Active Self  clonazePAM  (KLONOPIN ) 0.5 MG tablet [id number] Yes Take 0.5 tablets (0.25 mg total) by mouth 2 (two) times daily as needed for anxiety. Tabori, Katherine E, MD  Active   cyanocobalamin  (VITAMIN B12) 1000 MCG/ML injection [id number] Yes Inject 1,000 mcg into the muscle every 30 (thirty) days. [provider]  Active Self  denosumab  (PROLIA ) 60 MG/ML SOSY injection [id number] Yes Inject 60 mg into the skin every 6 (six) months. [provider]  Active Self           Med Note JACKOLYN APOLINAR JONELLE Pablo Jun 11, 2020  2:33 PM)    furosemide  (LASIX ) 20 MG tablet [id number] Yes Take 20 mg by mouth every Tuesday, Thursday, and Saturday at 6 PM. Per patient taking 60 mg on these days [provider]  Active Self  furosemide  (LASIX ) 40 MG tablet [id number] Yes Take 1 tablet (40 mg total) by mouth daily. Pietro Redell RAMAN, MD  Active   levalbuterol  (XOPENEX  HFA) 45 MCG/ACT inhaler [id number] Yes Inhale 2 puffs into the lungs every 4 (four) hours as needed for wheezing. Kara Dorn NOVAK, MD  Active   levothyroxine  (SYNTHROID ) 75 MCG tablet [id number] Yes Take 1 tablet (75 mcg total) by mouth daily. [name], MD  Active   loratadine (CLARITIN) 10 MG tablet [id number] Yes Take 10 mg by mouth in the morning. [provider]  Active Self  metoprolol  tartrate (LOPRESSOR ) 25 MG tablet [id number] Yes Take 1 tablet by mouth twice daily Pietro Redell RAMAN, MD  Active Self  mupirocin ointment (BACTROBAN) 2 % [id number] Yes Apply 1 Application topically daily. [provider]  Active Self  nystatin  (MYCOSTATIN /NYSTOP ) powder [id number] Yes Apply topically 2 (two) times daily as needed (skin irritation (under breasts)). Maurice Sharlet RAMAN, PA-C  Active Self  ondansetron  (ZOFRAN ) 8 MG tablet [id number] Yes Take 1 tablet (8 mg total) by mouth every 8 (eight) hours as needed for nausea or vomiting. Tabori, Katherine E, MD  Active Self  ondansetron  (ZOFRAN -ODT) 4 MG disintegrating tablet [id number] Yes Take 1 tablet (4 mg total) by mouth every 8 (eight) hours as needed for nausea or vomiting. Ruthe Cornet, DO  Active   oxyCODONE  (ROXICODONE ) 5 MG/5ML solution [id number] Yes Take 4 mLs (4 mg total) by mouth every 4 (four) hours as needed for moderate pain. Maurice Sharlet RAMAN, PA-C  Active Self           Med Note JACKOLYN APOLINAR JONELLE Pablo Jun 11, 2020  2:33 PM)    pantoprazole  (PROTONIX ) 40 MG tablet [id number] Yes Take 1 tablet by mouth once daily Dewald, Jonathan B, MD  Active Self  polyethylene glycol (MIRALAX  / GLYCOLAX ) packet [id number] Yes Take 17 g by mouth daily as needed for mild constipation. [provider]  Active Self  potassium chloride  SA (KLOR-CON  M) 20 MEQ tablet [id number] Yes Take 1 tablet (20 mEq total) by mouth 3 (three) times a week. Take on Tuesdays, Thursdays and Saturdays when you take Lasix  60 mg. West, Katlyn D, NP  Active   revefenacin  (YUPELRI ) 175 MCG/3ML nebulizer solution [id number] Yes Inhale one vial in nebulizer once daily. Do not mix with other nebulized medications. Kara Dorn NOVAK, MD  Active   sodium chloride  (  OCEAN) 0.65 % SOLN nasal spray [id number] Yes Place 1 spray into both nostrils as needed for congestion (nose bleeds). [provider]  Active Self  Ubrogepant (UBRELVY) 100 MG TABS [id number] Yes Take 25 mg by mouth daily as needed (headaches/migraines). [provider]  Active Self  valACYclovir  (VALTREX ) 500 MG tablet [id number] Yes Take 500 mg by mouth daily as needed (breakouts). [provider]  Active Self            Recommendation:   Continue Current Plan of Care  Follow Up Plan:   Telephone follow up appointment date/time:  03/04/24 at 12:30 with  RN Care Manager Braleigh Massoud L.  Rye Decoste A. Gordy RN, BA, [facility], CRRN [place]  [facility] Population Health RN Care Manager Direct Dial: [phone]  Fax: [phone]

## 2024-03-02 ENCOUNTER — Telehealth: Payer: Self-pay | Admitting: Cardiology

## 2024-03-02 NOTE — Telephone Encounter (Signed)
 Per cart review, removed from med list 01/2024  Take 2 grams (4 tablets) by mouth 1 hour prior to dental cleaning/procedure., Normal  Dispense: 4 capsule  Refills: 2 ordered  Pharmacy: [id number] - [address], [place] - [address] (Ph: [phone])  Order Details Ordered on: 03/27/22  Discontinued on: 01/19/24  Authorizing provider: Hobart Powell BRAVO, MD   Will defer to MD to reauthorize prescription as not on list.

## 2024-03-02 NOTE — Telephone Encounter (Signed)
*  STAT* If patient is at the pharmacy, call can be transferred to refill team.   1. Which medications need to be refilled? (please list name of each medication and dose if known) cephALEXin  (KEFLEX ) 500 MG capsule   2. Which pharmacy/location (including street and city if local pharmacy) is medication to be sent to?  [id number] - [place], [place] - [address]      3. Do they need a 30 day or 90 day supply? 4 tablets  Pt is out of medication and her dentist appt is Friday 03/04/2024.

## 2024-03-02 NOTE — Telephone Encounter (Signed)
 Pt is requesting a refill on non cardiac medication cephalexin . This medication was D/C off of pt's medication list. Would Dr. Pietro like to prescribe this medication for pt's dental procedures? Please address

## 2024-03-03 ENCOUNTER — Other Ambulatory Visit: Payer: Self-pay | Admitting: Licensed Clinical Social Worker

## 2024-03-03 DIAGNOSIS — F32A Depression, unspecified: Secondary | ICD-10-CM

## 2024-03-03 DIAGNOSIS — F411 Generalized anxiety disorder: Secondary | ICD-10-CM

## 2024-03-03 MED ORDER — CEPHALEXIN 500 MG PO CAPS: ORAL_CAPSULE | ORAL | 6 refills | Status: DC

## 2024-03-03 NOTE — Telephone Encounter (Signed)
Refill sent to the pharmacy electronically.  

## 2024-03-04 ENCOUNTER — Other Ambulatory Visit: Payer: Self-pay

## 2024-03-04 NOTE — Patient Instructions (Signed)
 Visit Information  Thank you for taking time to visit with me today. Please don't hesitate to contact me if I can be of assistance to you before our next scheduled telephone appointment.  Our next appointment is by telephone on 03/11/2024 at 10am  Following is a copy of your care plan:   Goals Addressed             This Visit's Progress    VBCI RN Care Plan       Problems:  Chronic Disease Management support and education needs related to High Risk for Falls  Goal: Over the next 3 months the Patient will demonstrate ongoing self health care management ability and take all Fall Precautions necessary to ensure no further falls as evidenced by    no further falls  Interventions:   Falls Interventions: Provided written and verbal education re: potential causes of falls and Fall prevention strategies Reviewed medications and discussed potential side effects of medications such as dizziness and frequent urination Advised patient of importance of notifying provider of falls Assessed for signs and symptoms of orthostatic hypotension Assessed patients knowledge of fall risk prevention secondary to previously provided education Provided patient information for fall alert systems  Patient Self-Care Activities:  Attend all scheduled provider appointments Call provider office for new concerns or questions  Perform all self care activities independently  Take medications as prescribed   Work with the social worker to address care coordination needs and will continue to work with the clinical team to address health care and disease management related needs 02/29/24 RNCM engagement - patient reports no falls since last RNCM call on 02/15/24. Continues to complain of chronic pain and takes oxycodone  as needed as well as 1/2 tab (= dose of 0.25mg ) of Klonopin  up to 2Xs/day. 03/04/24 RNCM engagement - patient reports no falls and that she did connnect with the LCSW, Lyle Rung yesterday to discuss  ways to manage stress. We also talked about the difference between the LCSW role and the BSW role of our Social Workers and that patient's appt with Orlean Fey on 03/09/24 will focus on patient's SDOH barriers and financial challenges. Patient is eager to talk to the BSW about possible resources available to her and her disabled son who lives with her.  Plan:  The patient has been provided with contact information for the care management team and has been advised to call with any health related questions or concerns.  Referral to LCSW to help patient manage daily stressors better Next RN Care Management appointment with Channing is on 03/11/2024 at 10:00 am.             Patient verbalizes understanding of instructions and care plan provided today and agrees to view in MyChart. Active MyChart status and patient understanding of how to access instructions and care plan via MyChart confirmed with patient.     The patient has been provided with contact information for the care management team and has been advised to call with any health related questions or concerns.   Please call the care guide team at [phone] if you need to cancel or reschedule your appointment.   Please call 1-800-273-TALK (toll free, 24 hour hotline) if you are experiencing a Mental Health or Behavioral Health Crisis or need someone to talk to.  Sloan Galentine A. Gordy RN, BA, [facility], CRRN [place]  [facility] Population Health RN Care Manager Direct Dial: [phone]  Fax: [phone]

## 2024-03-04 NOTE — Patient Instructions (Signed)
 Visit Information  Thank you for taking time to visit with me today. Please don't hesitate to contact me if I can be of assistance to you before our next scheduled appointment.  Our next appointment is by telephone on 9/12/251 at 11am Please call the care guide team at [phone] if you need to cancel or reschedule your appointment.   Following is a copy of your care plan:   Goals Addressed             This Visit's Progress    LCSW VBCI Social Work Care Plan       Problems:  Chronic Disease Management support and education needs related to High Risk for Falls  Goal: Over the next 3 months the Patient will demonstrate ongoing self health (physical and mental) care management ability and gain necessary behavioral health support and resource connection as evidenced by successfully engaging with [facility] services.   Interventions:   Depressive/Anxiety Interventions:  Current barriers:   Chronic Mental Health needs related to symptoms of depression, stress and anxiety. Patient requires Support, Education, Resources, Referrals, Advocacy, and Care Coordination, in order to meet Unmet Mental Health Needs and to find a therapist and psychiatrist. Mental Health Concerns and Social Isolation Patient lacks knowledge of local and available community resources and counseling agencies.   Clinical Goal(s): verbalize understanding of plan for management of Anxiety, Depression, and Stress symptoms and demonstrate a reduction in symptoms. Patient will connect with a provider for ongoing mental health treatment, increase coping skills, healthy habits, self-management skills, and stress reduction      Patient will implement clinical interventions discussed today to decrease symptoms of depression and increase knowledge and/or ability of: coping skills.    Clinical Interventions:  Assessed patient's previous and current treatment, coping skills, support system and barriers to care. Patient provided hx   Verbalization of feelings encouraged, motivational interviewing employed Emotional support provided, positive coping strategies explored. Establishing healthy boundaries emphasized and healthy self-care education provided Patient was educated on available mental health resources within their area that accept her insurance and offer counseling and psychiatry. Patient was advised to contact the back of her insurance card for assistance with benefits as well. Patient is agreeable to referral to [facility] for counseling and psychiatry. [facility] LCSW made referral on 03/03/24. Emotional support provided. CBT intervention implemented regarding being mentally fit by combating negative thinking and replacing it with uplifting support, hope and positivity. Assessed social determinant of health barriers Discussed use of relaxation techniques and/or diversional activities to assist with pain reduction (distraction, imagery, relaxation, massage, acupressure, TENS, heat, and cold application; Reviewed with patient prescribed pharmacological and nonpharmacological pain relief strategies; Patient will work on implementing appropriate self-care habits into their daily routine such as: staying positive, writing a gratitude list, drinking water, staying active around the house, taking their medications as prescribed, combating negative thoughts or emotions and staying connected with their family and friends. Positive reinforcement provided for this decision to work on this. Motivational Interviewing employed Depression screen reviewed  PHQ2/ PHQ9 completed or reviewed  Mindfulness or Relaxation training provided Active listening / Reflection utilized  Emotional Support Provided Problem Solving /Task Center strategies reviewed Provided psychoeducation for mental health needs  Provided brief CBT  Reviewed mental health medications and discussed importance of compliance:  Quality of sleep assessed & Sleep Hygiene  techniques promoted  Participation in counseling encouraged  Verbalization of feelings encouraged  Suicidal Ideation/Homicidal Ideation assessed: Patient denies SI/HI  Review resources, discussed options and  provided patient information about  Mental Health Resources Inter-disciplinary care team collaboration (see longitudinal plan of care) Referral to VBCI BSW for SDOH needs  Patient Self-Care Activities:  Attend all scheduled provider appointments Call provider office for new concerns or questions  Perform all self care activities independently  Take medications as prescribed   Work with the social worker to address care coordination needs and will continue to work with the clinical team to address health care, SDOH barriers and disease management related needs  Plan:  The patient has been provided with contact information for the care management team and has been advised to call with any health related questions or concerns.  Referral to BSW to help patient manage daily financial stressors better Next RN Care Management appointment with Channing is on 03/04/2024 at 12:30 pm. Next LCSW Care Management appointment with Lyle is on 03/18/24 at 11 am.              Please call the Suicide and Crisis Lifeline: 988 call the [place]  National Suicide Prevention Lifeline: 7877624543 or TTY: (671) 511-7519 TTY 403-344-1498) to talk to a trained counselor call 1-800-273-TALK (toll free, 24 hour hotline) go to [facility] [address], [place] [phone]) call 911 if you are experiencing a Mental Health or Behavioral Health Crisis or need someone to talk to.  Patient verbalizes understanding of instructions and care plan provided today and agrees to view in MyChart. Active MyChart status and patient understanding of how to access instructions and care plan via MyChart confirmed with patient.     Lyle Rung, BSW, MSW, LCSW Licensed Clinical  Social Worker [facility]   [facility] ([email or web address] Direct Dial: [phone]

## 2024-03-04 NOTE — Patient Outreach (Signed)
 Complex Care Management   Visit Note  03/03/2024  Name:  [name] MRN: [id number] DOB: 06/13/1946  Situation: Referral received for Complex Care Management related to Mental/Behavioral Health diagnosis anxiety. I obtained verbal consent from Patient.  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   Mitral regurgitation    Osteopenia 02/18/2012   Osteoporosis    PAF (paroxysmal atrial fibrillation) (HCC) 2012   Peripheral neuropathy 11/06/2011   PMDD (premenstrual dysphoric disorder)    PONV (postoperative nausea and vomiting)    nausea, vomiting, hives and dizziness    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/2020   Complete bilateral atrial lesion set using cryothermy and bipolar radiofrequency ablation with clipping of LA appendage via right mini-thoracotomy approach   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   Complex valvuloplasty including artificial Gore-tex neochord placement x12 with 32mm Sorin Memo 4D ring annuloplasty   Seasonal allergies    Vaginal delivery    ONE NSVD   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    Assessment: Patient Reported Symptoms:  Cognitive Cognitive Status: Normal speech and language skills, Alert and oriented to person, place, and time, Able to follow simple commands Cognitive/Intellectual Conditions Management [RPT]: None reported or documented in medical history or problem list   Health  Maintenance Behaviors: Annual physical exam Healing Pattern: Unsure Health Facilitated by: Pain control, Prayer/meditation, Rest  Neurological Neurological Review of Symptoms: No symptoms reported    HEENT HEENT Symptoms Reported: Change or loss of hearing HEENT Management Strategies: Coping strategies, Routine screening HEENT Self-Management Outcome: 3 (uncertain)      03/04/2024    PHQ2-9 Depression Screening   Little interest or pleasure in doing things More than half the days  Feeling down, depressed, or hopeless More than half the days  PHQ-2 - Total Score 4  Trouble falling or staying asleep, or sleeping too much More than half the days  Feeling tired or having little energy Nearly every day  Poor appetite or overeating  Not at all  Feeling bad about yourself - or that you are a failure or have let yourself or your family down Not at all  Trouble concentrating on things, such as reading the newspaper or watching television Not at all  Moving or speaking so slowly that other people could have noticed.  Or the opposite - being so fidgety or restless that you have been moving around a lot more than usual Not at all  Thoughts that you would be better off dead, or hurting yourself in some way Not at all  PHQ2-9 Total Score 9  If you checked off any problems, how difficult have these problems made it for you to do your work, take care of things at home, or get along with other people Somewhat difficult  Depression Interventions/Treatment Referral to Psychiatry  There were no vitals filed for this visit.  Medications Reviewed Today     Reviewed by Merlynn Lyle CROME, LCSW (Social Worker) on 03/03/24 at 1433  Med List Status: <None>   Medication Order Taking? Sig Documenting Provider Last Dose Status Informant  acetaminophen  (TYLENOL ) 500 MG tablet [id number]  Take 500 mg by mouth every 6 (six) hours as needed (pain.). [provider]  Active Self  amiodarone  (PACERONE ) 200 MG  tablet [id number]  Take 200 mg by mouth daily. [provider]  Active   aspirin  EC 81 MG tablet [id number]  Take 1 tablet (81 mg total) by mouth daily. Swallow whole. Pietro Redell RAMAN, MD  Active   calcium  carbonate (OS-CAL) 1250 (500 Ca) MG chewable tablet [id number]  Chew 1 tablet by mouth every evening. [provider]  Active Self  Calcium  Carbonate Antacid (TUMS CHEWY BITES PO) [id number]  Take 1 tablet by mouth daily as needed (reflux).  [provider]  Active Self  Carboxymethylcellul-Glycerin  (LUBRICATING EYE DROPS OP) [id number]  Place 1 drop into both eyes 3 (three) times daily as needed (dry/irritated eys.). [provider]  Active Self  cephALEXin  (KEFLEX ) 500 MG capsule [id number]  Rake all 4 tablets 1 hour prior to dental procedure Pietro Redell RAMAN, MD  Active   Cholecalciferol (VITAMIN D -3 PO) [id number]  Take 1 tablet by mouth every evening. [provider]  Active Self  clonazePAM  (KLONOPIN ) 0.5 MG tablet [id number]  Take 0.5 tablets (0.25 mg total) by mouth 2 (two) times daily as needed for anxiety. Tabori, Katherine E, MD  Active   cyanocobalamin  (VITAMIN B12) 1000 MCG/ML injection [id number]  Inject 1,000 mcg into the muscle every 30 (thirty) days. [provider]  Active Self  denosumab  (PROLIA ) 60 MG/ML SOSY injection [id number]  Inject 60 mg into the skin every 6 (six) months. [provider]  Active Self           Med Note JACKOLYN APOLINAR JONELLE Pablo Jun 11, 2020  2:33 PM)    furosemide  (LASIX ) 20 MG tablet [id number]  Take 20 mg by mouth every Tuesday, Thursday, and Saturday at 6 PM. Per patient taking 60 mg on these days [provider]  Active Self  furosemide  (LASIX ) 40 MG tablet [id number]  Take 1 tablet (40 mg total) by mouth daily. Pietro Redell RAMAN, MD  Active   levalbuterol  (XOPENEX  HFA) 45 MCG/ACT inhaler [id number]  Inhale 2 puffs into the lungs every 4 (four) hours as needed for wheezing. Kara Dorn NOVAK,  MD  Active   levothyroxine  (SYNTHROID ) 75 MCG tablet [id number]  Take 1 tablet (75 mcg total) by mouth daily. [name], MD  Active   loratadine (CLARITIN) 10 MG tablet [id number]  Take 10 mg by mouth in the morning. [provider]  Active Self  metoprolol  tartrate (LOPRESSOR ) 25 MG tablet [id number]  Take 1 tablet by mouth twice daily Pietro Redell RAMAN, MD  Active Self  mupirocin ointment (BACTROBAN) 2 % [id number]  Apply 1 Application topically daily. [provider]  Active Self  nystatin  (MYCOSTATIN /NYSTOP ) powder [id number]  Apply topically 2 (two) times daily as needed (skin irritation (under breasts)). Maurice Sharlet RAMAN, PA-C  Active Self  ondansetron  (ZOFRAN ) 8 MG tablet [id number]  Take 1 tablet (8 mg total) by mouth every 8 (eight) hours as needed for nausea or vomiting. Tabori, Katherine E, MD  Active Self  ondansetron  (ZOFRAN -ODT) 4 MG disintegrating tablet [id number]  Take 1 tablet (4 mg total)  by mouth every 8 (eight) hours as needed for nausea or vomiting. Ruthe Cornet, DO  Active   oxyCODONE  (ROXICODONE ) 5 MG/5ML solution [id number]  Take 4 mLs (4 mg total) by mouth every 4 (four) hours as needed for moderate pain. Maurice Sharlet RAMAN, PA-C  Active Self           Med Note JACKOLYN APOLINAR JONELLE Pablo Jun 11, 2020  2:33 PM)    pantoprazole  (PROTONIX ) 40 MG tablet [id number]  Take 1 tablet by mouth once daily Dewald, Jonathan B, MD  Active Self  polyethylene glycol (MIRALAX  / GLYCOLAX ) packet [id number]  Take 17 g by mouth daily as needed for mild constipation. [provider]  Active Self  potassium chloride  SA (KLOR-CON  M) 20 MEQ tablet [id number]  Take 1 tablet (20 mEq total) by mouth 3 (three) times a week. Take on Tuesdays, Thursdays and Saturdays when you take Lasix  60 mg. West, Katlyn D, NP  Active   revefenacin  (YUPELRI ) 175 MCG/3ML nebulizer solution [id number]  Inhale one vial in nebulizer once daily. Do not mix with other nebulized medications. Kara Dorn NOVAK, MD  Active   sodium chloride  (OCEAN) 0.65 % SOLN nasal spray [id number]  Place 1 spray into both nostrils as needed for congestion (nose bleeds). [provider]  Active Self  Ubrogepant (UBRELVY) 100 MG TABS [id number]  Take 25 mg by mouth daily as needed (headaches/migraines). [provider]  Active Self  valACYclovir  (VALTREX ) 500 MG tablet [id number]  Take 500 mg by mouth daily as needed (breakouts). [provider]  Active Self            Recommendation:   PCP Follow-up Continue Current Plan of Care  Follow Up Plan:   Telephone follow-up in 1 month  Lyle Rung, BSW, MSW, LCSW Licensed Clinical Social Worker [facility]   [email or web address] Direct Dial: [phone]

## 2024-03-06 NOTE — Assessment & Plan Note (Signed)
 Deteriorated.  Pt is very anxious today since Dr Jerrell mentioned end of life care and her multiple complex medical issues.  She wants to know how much time I think she has left.  I again told her I was not able to guess that sort of thing and we are all technically living on borrowed time which is why it is important to make the most of what we have.  She was in agreement w/ this and reports that she very much wants to keep living.  She actually looks better today than I have seen her in awhile.  Her coloring is better, she has more energy- and I told her so.  She was very pleased w/ this.  I personally spent a total of 42 minutes in the care of the patient today including preparing to see the patient, getting/reviewing separately obtained history, and counseling and educating.

## 2024-03-09 ENCOUNTER — Other Ambulatory Visit: Payer: Self-pay

## 2024-03-09 ENCOUNTER — Telehealth: Payer: Self-pay | Admitting: Cardiology

## 2024-03-09 NOTE — Patient Instructions (Signed)
 Visit Information  Thank you for taking time to visit with me today. Please don't hesitate to contact me if I can be of assistance to you before our next scheduled appointment.  Our next appointment is by telephone on 03/22/2024 at 2:30pm Please call the care guide team at [phone] if you need to cancel or reschedule your appointment.     Please call the Suicide and Crisis Lifeline: 988 call the [place]  National Suicide Prevention Lifeline: (504)461-2019 or TTY: 580-724-9364 TTY 450-648-8501) to talk to a trained counselor call 1-800-273-TALK (toll free, 24 hour hotline) call 911 if you are experiencing a Mental Health or Behavioral Health Crisis or need someone to talk to.  Patient verbalizes understanding of instructions and care plan provided today and agrees to view in MyChart. Active MyChart status and patient understanding of how to access instructions and care plan via MyChart confirmed with patient.     Orlean Fey, BSW [place]  [facility] Social Worker, [facility] [phone]

## 2024-03-09 NOTE — Patient Outreach (Addendum)
 Complex Care Management   Visit Note  03/09/2024  Name:  [name] MRN: [id number] DOB: 1946-04-11  Situation: Referral received for Complex Care Management related to SDOH Barriers:  Financial Resource Strain I obtained verbal consent from Patient.  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   Mitral regurgitation    Osteopenia 02/18/2012   Osteoporosis    PAF (paroxysmal atrial fibrillation) (HCC) 2012   Peripheral neuropathy 11/06/2011   PMDD (premenstrual dysphoric disorder)    PONV (postoperative nausea and vomiting)    nausea, vomiting, hives and dizziness    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/2020   Complete bilateral atrial lesion set using cryothermy and bipolar radiofrequency ablation with clipping of LA appendage via right mini-thoracotomy approach   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 During the conversation, it was determined that financial resource strain continues to be a significant challenge for this patient. The patient shared that she has been living in a two-story home in  [place], [place]  for the past four years. She expressed that she would like to downsize to a more affordable property, but wishes to remain in [place] because she enjoys the convenience of her current location and feels connected to the community. She stated that she is not interested in moving to an assisted living facility because of the high costs and because she is still able to move around on her own and does not feel she needs that level of care at this time.The patient reported that she budgets carefully but still experiences financial strain. She has been seeing the same dentist for the past fifty years, but explained that her dental care is becoming increasingly expensive and that her insurance does not always cover the cost of services. Because of this, BSW will send the patient information on additional low-cost dental care resources so that she has alternative options available if needed.The patient also shared that she previously participated in Meals on wheels but had to stop because the food did not work well with her diet or health needs. She expressed interest in receiving information on other resources that may help reduce her expenses, such as assistance with her [employer] and options for a life alert system so that she can feel safer living independently. Additionally, the patient requested information about Medicaid because someone had previously told her that she may be eligible to combine her Medicare part A and B with Medicaid for additional coverage. To address these  needs, BSW will send the patient several resources. This will include contact information for the [place] [employer] regional [employer] housing authority. BSW will also send the patient a list of local affordable rental properties in [place] so she can explore downsizing options in her preferred area. BSW will also provide information on sliding-scale dental clinics in [place] , as well as  local affordable dental providers such as [facility] and [facility]. BSW will also send the patient information about financial assistance programs to help with utility bills, including [place] [employer]'s Share the Warmth program, the [place]  Low Income Energy Assistance Program (LIEAP), and [employer]'s emergency utility assistance. And for her request about life alert services, BSW will send her information about Life Alert, [employer]' Personal Emergency Response System, and [place]'s Med Alert program so she can review the available options and select the one that best fits her needs and budget.BSW will provide educational information about Medicaid eligibility in [place] , including how Medicaid can work alongside [employer] Part A and B to cover costs like deductibles, copays, and some additional health services. This will also include information about dual-eligible Special Needs Plans, which may offer additional benefits such as transportation and vision coverage.    After sending these resources, BSW has a follow up call with patient on 03/22/2024 at 2:30 pm to follow up and confirm that she has received the resources.    Recommendation:   No recommendations at this time.  Follow Up Plan:   Telephone follow-up on 03/22/2024 at 2:30 pm  Orlean Fey, BSW [place]  [facility] Social Worker, [facility] [phone]

## 2024-03-09 NOTE — Telephone Encounter (Signed)
 Patient states she has been experiencing chest pain and SOB for the past 2-3 months. She reports pain goes through her chest to her back between shoulder blades. She takes Tylenol  and oxycodone  with some relief.  She reports ongoing SOB. She states she had these symptoms at her last office visit in July, but they are progressively getting worse.  Advised patient on ED precautions, recommending ED for active chest pain. Patient verbalized understanding and states she would like to be seen for evaluation. Offered appt with APP for 9/5, patient accepted. Reviewed ED precautions with patient and she again verbalized understanding.

## 2024-03-09 NOTE — Telephone Encounter (Signed)
  Pt c/o of Chest Pain: STAT if active CP, including tightness, pressure, jaw pain, radiating pain to shoulder/upper arm/back, CP unrelieved by Nitro. Symptoms reported of SOB, nausea, vomiting, sweating.  1. Are you having CP right now?   Yes - ongoing for several days  2. Are you experiencing any other symptoms (ex. SOB, nausea, vomiting, sweating)?   SOB - is ongoing  3. Is your CP continuous or coming and going?   Continuous  4. Have you taken Nitroglycerin ?   No  5. How long have you been experiencing CP?   A long time  6. If NO CP at time of call then end call with telling Pt to call back or call 911 if Chest pain returns prior to return call from triage team.   Patient stated the pain is in her chest which goes through to her back.  Patient noted she has had back surgery.

## 2024-03-10 NOTE — Patient Outreach (Signed)
 Friday, 03/04/2024  Spoke to patient briefly at scheduled time of RNCM engagement on 03/04/2024 but patient requested I call back next week instead of completing a full assessment today. States she is safe and doing okay and would prefer I follow up with her after she speaks to the BSW I had referred her to (has appt with BSW Orlean Fey on 9/3 @ 2:30 pm) regarding the SDOH barriers including financial challenges identified during my initial engagement & enrollment of the patient into the [facility] CCM program.  Channing A. Gordy RN, BA, [facility], CRRN [place]  [facility] Population Health RN Care Manager Direct Dial: [phone]  Fax: [phone]

## 2024-03-11 ENCOUNTER — Other Ambulatory Visit: Payer: Self-pay

## 2024-03-11 ENCOUNTER — Ambulatory Visit: Admitting: Nurse Practitioner

## 2024-03-11 NOTE — Patient Instructions (Signed)
 Visit Information  Thank you for taking time to visit with me today. Please don't hesitate to contact me if I can be of assistance to you before our next scheduled telephone appointment.  Our next appointment is by telephone on 03/21/2024 at 9 am.  Following is a copy of your care plan:   Goals Addressed             This Visit's Progress    VBCI RN Care Plan       Problems:  Chronic Disease Management support and education needs related to High Risk for Falls  Goal: Over the next 3 months the Patient will demonstrate ongoing self health care management ability and take all Fall Precautions necessary to ensure no further falls as evidenced by    no further falls  Interventions:   Falls Interventions: Provided written and verbal education re: potential causes of falls and Fall prevention strategies Reviewed medications and discussed potential side effects of medications such as dizziness and frequent urination Advised patient of importance of notifying provider of falls Assessed for signs and symptoms of orthostatic hypotension Assessed patients knowledge of fall risk prevention secondary to previously provided education Provided patient information for fall alert systems  Patient Self-Care Activities:  Attend all scheduled provider appointments Call provider office for new concerns or questions  Perform all self care activities independently  Take medications as prescribed   Work with the social worker to address care coordination needs and will continue to work with the clinical team to address health care and disease management related needs 02/29/24 RNCM engagement - patient reports no falls since last RNCM call on 02/15/24. Continues to complain of chronic pain and takes oxycodone  as needed as well as 1/2 tab (= dose of 0.25mg ) of Klonopin  up to 2Xs/day. 03/04/24 RNCM engagement - patient reports no falls and that she did connnect with the LCSW, Lyle Rung yesterday to  discuss ways to manage stress. We also talked about the difference between the LCSW role and the BSW role of our Social Workers and that patient's appt with Orlean Fey on 03/09/24 will focus on patient's SDOH barriers and financial challenges. Patient is eager to talk to the BSW about possible resources available to her and her disabled son who lives with her. 03/11/2024 RNCM f/u - patient states her health has been stable. Patient also expressed appreciation for BSW referral - felt Orlean Fey was very helpful and states she is awaiting some materials sent to her by the BSW regarding SDOH resources patient may be able to utilize/apply for  Plan:  The patient has been provided with contact information for the care management team and has been advised to call with any health related questions or concerns.  Referral to LCSW to help patient manage daily stressors better Next RN Care Management appointment with Channing is on 03/21/2024 at 9:00 am.             Patient verbalizes understanding of instructions and care plan provided today and agrees to view in MyChart. Active MyChart status and patient understanding of how to access instructions and care plan via MyChart confirmed with patient.     The patient has been provided with contact information for the care management team and has been advised to call with any health related questions or concerns.   Please call the care guide team at [phone] if you need to cancel or reschedule your appointment.   Please call 1-800-273-TALK (toll free, 24 hour hotline) if  you are experiencing a Mental Health or Behavioral Health Crisis or need someone to talk to.  Georgeann Brinkman A. Gordy RN, BA, [facility], CRRN [place]  [facility] Population Health RN Care Manager Direct Dial: [phone]  Fax: [phone]

## 2024-03-11 NOTE — Progress Notes (Deleted)
 Office Visit    Patient Name: [name] Date of Encounter: 03/11/2024  Primary Care Provider:  [name], MD Primary Cardiologist:  Redell Shallow, MD  Chief Complaint    78 year old female with a history of nonobstructive coronary artery disease, chronic dyspnea, paroxysmal atrial fibrillation, mitral valve regurgitation s/p minimally invasive mitral valve repair with MAZE procedure in 01/2020, hypertension, OSA, arthritis, BPPV, and severe anxiety who presents for follow-up related to atrial fibrillation.  Past Medical History    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   Mitral regurgitation    Osteopenia 02/18/2012   Osteoporosis    PAF (paroxysmal atrial fibrillation) (HCC) 2012   Peripheral neuropathy 11/06/2011   PMDD (premenstrual dysphoric disorder)    PONV (postoperative nausea and vomiting)    nausea, vomiting, hives and dizziness    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/2020   Complete bilateral atrial lesion set using cryothermy and bipolar radiofrequency ablation with clipping of LA appendage via right mini-thoracotomy approach   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   Complex valvuloplasty including artificial Gore-tex neochord placement x12 with 32mm Sorin Memo 4D ring annuloplasty   Seasonal allergies    Vaginal delivery    ONE NSVD   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   Past Surgical History:  Procedure Laterality Date   ANTERIOR  CERVICAL DECOMP/DISCECTOMY FUSION  ~ 2003   BACK SURGERY     BREAST SURGERY     BREAST BIOPSY--RIGHT BENIGN   BUNIONECTOMY Bilateral    CARDIOVERSION N/A 01/26/2020   Procedure: CARDIOVERSION;  Surgeon: Barbaraann Darryle Ned, MD;  Location: [facility] ENDOSCOPY;  Service: Cardiovascular;  Laterality: N/A;   CARDIOVERSION N/A 10/27/2023   Procedure: CARDIOVERSION;  Surgeon: Shlomo Wilbert SAUNDERS, MD;  Location: [facility] INVASIVE CV LAB;  Service: Cardiovascular;  Laterality: N/A;   COSMETIC SURGERY  2016   back of my neck; related to earlier fusion   CYSTOSCOPY W/ STONE MANIPULATION  several times   DILATION AND CURETTAGE OF UTERUS     FOREHEAD RECONSTRUCTION Right    removed bone protruding out of my forehead   HARDWARE REMOVAL  2016   related to neck OR   INCONTINENCE SURGERY     IR THORACENTESIS ASP PLEURAL SPACE W/IMG GUIDE  02/16/2020   MINIMALLY INVASIVE MAZE PROCEDURE N/A 01/17/2020   Procedure: MINIMALLY INVASIVE MAZE PROCEDURE;  Surgeon: Dusty Sudie DEL, MD;  Location: [facility] OR;  Service: Open Heart Surgery;  Laterality: N/A;   MITRAL VALVE REPAIR Right 01/17/2020   Procedure: MINIMALLY INVASIVE MITRAL VALVE REPAIR (MVR) USING MEMO 4D ;  Surgeon: Dusty Sudie DEL, MD;  Location: [facility] OR;  Service: Open Heart Surgery;  Laterality: Right;   POSTERIOR CERVICAL FUSION/FORAMINOTOMY  ~ 2008; 2015   RIGHT/LEFT HEART CATH AND CORONARY ANGIOGRAPHY N/A 12/02/2019   Procedure: RIGHT/LEFT HEART CATH AND CORONARY ANGIOGRAPHY;  Surgeon: Verlin Lonni BIRCH, MD;  Location: [facility] INVASIVE CV LAB;  Service: Cardiovascular;  Laterality: N/A;   SHOULDER ARTHROSCOPY W/ ROTATOR CUFF REPAIR Right 2012   SPINAL FUSION  06/2014 - 2018 X ?7   scoliosis; my entire back   TEE WITHOUT CARDIOVERSION N/A 11/21/2019   Procedure: TRANSESOPHAGEAL ECHOCARDIOGRAM (TEE);  Surgeon: Delford Maude BROCKS, MD;  Location: [facility] ENDOSCOPY;  Service: Cardiovascular;  Laterality: N/A;   TEE WITHOUT CARDIOVERSION N/A 01/17/2020   Procedure:  TRANSESOPHAGEAL ECHOCARDIOGRAM (TEE);  Surgeon: Dusty Sudie DEL, MD;  Location: [facility] OR;  Service: Open Heart Surgery;  Laterality: N/A;   TEE WITHOUT CARDIOVERSION N/A 01/26/2020   Procedure: TRANSESOPHAGEAL ECHOCARDIOGRAM (TEE);  Surgeon: Barbaraann Darryle Ned, MD;  Location: [facility] ENDOSCOPY;  Service: Cardiovascular;  Laterality: N/A;   TUBAL LIGATION     VAGINAL HYSTERECTOMY     TVH    Allergies  Allergies  Allergen Reactions   Buprenorphine Nausea Only and Other (See Comments)    sedation and adhesive reaction--patches    Erythromycin Nausea And Vomiting and Other (See Comments)    Dizziness     Iodinated Contrast Media Hives, Nausea Only and Rash   Latex Palpitations, Rash and Other (See Comments)    Hives/sores   Other Other (See Comments)    CAN NOT TAKE , aLPRAZOLAM, OR ELAVIL DUE TO AFIB FOR ANXIETY   Steroids-jittery/hyper wants to crawl out of skin   Tape Hives, Itching, Rash and Other (See Comments)    Heart monitor stickers must be rotated in order to prevent rash Adhesive on EKG tabs=BURNS SKIN**PAPER TAPE OK** sores     Ciprofloxacin  Other (See Comments)    Burning all over   Duloxetine  Hcl Other (See Comments)    Severe diarrhea and upset stomach   Gabapentin  Other (See Comments)    Unknown--unsteady gait/felt intoxicated   Hydrocodone Other (See Comments)    Sedation,dizziness, and nausea   Hydromorphone  Nausea Only and Other (See Comments)    Sedation,dizziness, and nausea   Iodine Hives   Levofloxacin Nausea And Vomiting and Nausea Only   Metrizamide Hives   Nsaids     CANNOT TAKE PER CARDIOLOGIST DUE TO AFIB    Nucynta [Tapentadol Hcl] Nausea And Vomiting and Other (See Comments)    nausea and sedation   Oxycodone  Itching, Nausea Only and Other (See Comments)    Delusions (intolerance)  PILLS ONLY sedation, dizziness, nausea,  and itching   Pentazocine Other (See Comments)    Unknown    Pregabalin Nausea Only    Unknown    Sulfasalazine  Hives   Morphine Nausea Only, Anxiety and Other (See Comments)    sedation and nausea delusions (intolerance) PT CANNOT WAKE UP AFTER TAKING MEDICATION AND HAS NIGHTMARES     Sulfa Antibiotics Hives, Nausea Only and Rash   Sulfamethoxazole-Trimethoprim Hives and Rash     Labs/Other Studies Reviewed    The following studies were reviewed today:  Cardiac Studies & Procedures   ______________________________________________________________________________________________     ECHOCARDIOGRAM  ECHOCARDIOGRAM COMPLETE 01/27/2023  Narrative ECHOCARDIOGRAM REPORT    Patient Name:   ANNABETH TORTORA Date of Exam: 01/27/2023 Medical Rec #:  [id number]   Height:       66.0 in Accession #:    [id number]  Weight:       114.2 lb Date of Birth:  1945/08/15   BSA:          1.576 m Patient Age:    77 years    BP:  100/60 mmHg Patient Gender: F           HR:           65 bpm. Exam Location:  Outpatient  Procedure: 2D Echo, Cardiac Doppler and Color Doppler  Indications:    S/P Mitral Valve repair Z98.89  History:        Patient has prior history of Echocardiogram examinations, most recent 02/12/2022. Arrythmias:Atrial Fibrillation; Signs/Symptoms:Shortness of Breath.  Mitral Valve: 32 mm Sorin Memo 4D prosthetic annuloplasty ring valve is present in the mitral position. Procedure Date: 01/17/20.  Sonographer:    Lauraine Pilot RDCS Referring Phys: [id number] ROLLO JONELLE LOUDER   Sonographer Comments: Image acquisition challenging due to patient body habitus. IMPRESSIONS   1. Left ventricular ejection fraction, by estimation, is 55 to 60%. The left ventricle has normal function. Left ventricular endocardial border not optimally defined to evaluate regional wall motion. There is mild left ventricular hypertrophy. Left ventricular diastolic function could not be evaluated. There is the interventricular septum is flattened in systole, consistent with right ventricular pressure  overload. 2. Right ventricular systolic function is moderately reduced. The right ventricular size is moderately enlarged. There is moderately elevated pulmonary artery systolic pressure. The estimated right ventricular systolic pressure is 48.9 mmHg. 3. Right atrial size was moderately dilated. 4. The mitral valve has been repaired/replaced. Trivial mitral valve regurgitation. The mean mitral valve gradient is 2.0 mmHg with average heart rate of 63 bpm. There is a 32 mm Sorin Memo 4D prosthetic annuloplasty ring present in the mitral position. Procedure Date: 01/17/20. Echo findings are consistent with normal structure and function of the mitral valve prosthesis. 5. Tricuspid valve regurgitation is moderate. 6. The aortic valve is tricuspid. There is mild calcification of the aortic valve. There is mild thickening of the aortic valve. Aortic valve regurgitation is mild. Aortic valve sclerosis is present, with no evidence of aortic valve stenosis. 7. Pulmonic valve regurgitation is moderate. 8. The inferior vena cava is dilated in size with <50% respiratory variability, suggesting right atrial pressure of 15 mmHg.  FINDINGS Left Ventricle: Left ventricular ejection fraction, by estimation, is 55 to 60%. The left ventricle has normal function. Left ventricular endocardial border not optimally defined to evaluate regional wall motion. The left ventricular internal cavity size was normal in size. There is mild left ventricular hypertrophy. The interventricular septum is flattened in systole, consistent with right ventricular pressure overload. Left ventricular diastolic function could not be evaluated.  Right Ventricle: The right ventricular size is moderately enlarged. No increase in right ventricular wall thickness. Right ventricular systolic function is moderately reduced. There is moderately elevated pulmonary artery systolic pressure. The tricuspid regurgitant velocity is 2.91 m/s, and with an  assumed right atrial pressure of 15 mmHg, the estimated right ventricular systolic pressure is 48.9 mmHg.  Left Atrium: Left atrial size was normal in size.  Right Atrium: Right atrial size was moderately dilated.  Pericardium: There is no evidence of pericardial effusion.  Mitral Valve: The mitral valve has been repaired/replaced. Trivial mitral valve regurgitation. There is a 32 mm Sorin Memo 4D prosthetic annuloplasty ring present in the mitral position. Procedure Date: 01/17/20. Echo findings are consistent with normal structure and function of the mitral valve prosthesis. MV peak gradient, 8.8 mmHg. The mean mitral valve gradient is 2.0 mmHg with average heart rate of 63 bpm.  Tricuspid Valve: The tricuspid valve is normal in structure. Tricuspid valve regurgitation is moderate.  Aortic Valve: The aortic valve is tricuspid. There is  mild calcification of the aortic valve. There is mild thickening of the aortic valve. Aortic valve regurgitation is mild. Aortic valve sclerosis is present, with no evidence of aortic valve stenosis.  Pulmonic Valve: The pulmonic valve was normal in structure. Pulmonic valve regurgitation is moderate.  Aorta: The aortic root is normal in size and structure.  Venous: The inferior vena cava is dilated in size with less than 50% respiratory variability, suggesting right atrial pressure of 15 mmHg.  IAS/Shunts: The atrial septum is grossly normal.   LEFT VENTRICLE PLAX 2D LVIDd:         2.90 cm LVIDs:         2.10 cm LV PW:         1.10 cm LV IVS:        1.00 cm LVOT diam:     1.90 cm LV SV:         42 LV SV Index:   27 LVOT Area:     2.84 cm  LV Volumes (MOD) LV vol d, MOD A2C: 59.7 ml LV vol d, MOD A4C: 44.9 ml LV vol s, MOD A2C: 23.1 ml LV vol s, MOD A4C: 14.9 ml LV SV MOD A2C:     36.6 ml LV SV MOD A4C:     44.9 ml LV SV MOD BP:      33.8 ml  RIGHT VENTRICLE RV S prime:     8.99 cm/s TAPSE (M-mode): 1.5 cm  LEFT ATRIUM              Index        RIGHT ATRIUM           Index LA diam:        3.10 cm 1.97 cm/m   RA Area:     20.30 cm LA Vol (A2C):   36.9 ml 23.41 ml/m  RA Volume:   68.10 ml  43.21 ml/m LA Vol (A4C):   40.9 ml 25.95 ml/m LA Biplane Vol: 40.0 ml 25.38 ml/m AORTIC VALVE                   PULMONIC VALVE LVOT Vmax:         70.90 cm/s  PR End Diast Vel: 6.15 msec LVOT Vmean:        43.400 cm/s LVOT VTI:          0.149 m AR Vena Contracta: 0.30 cm  AORTA Ao Root diam: 3.00 cm Ao Asc diam:  3.00 cm  MITRAL VALVE              TRICUSPID VALVE MV Area (PHT): 2.89 cm   TR Peak grad:   33.9 mmHg MV Area VTI:   1.19 cm   TR Vmax:        291.00 cm/s MV Peak grad:  8.8 mmHg MV Mean grad:  2.0 mmHg   SHUNTS MV Vmax:       1.48 m/s   Systemic VTI:  0.15 m MV Vmean:      62.3 cm/s  Systemic Diam: 1.90 cm  Soyla Merck MD Electronically signed by Soyla Merck MD Signature Date/Time: 01/28/2023/9:53:11 PM    Final   TEE  ECHO TEE 01/26/2020  Narrative TRANSESOPHOGEAL ECHO REPORT    Patient Name:   ATIANA LEVIER Date of Exam: 01/26/2020 Medical Rec #:  [id number]   Height:       66.0 in Accession #:    [id number]  Weight:  136.7 lb Date of Birth:  06-20-1946   BSA:          1.701 m Patient Age:    74 years    BP:           118/66 mmHg Patient Gender: F           HR:           123 bpm. Exam Location:  Inpatient  Procedure: TEE-Intraopertive and 3D Echo  Indications:     atrial fibrillation  History:         Patient has prior history of Echocardiogram examinations, most recent 11/21/2019. Arrythmias:Atrial Fibrillation.  Mitral Valve: 32 mm prosthetic annuloplasty ring valve is present in the mitral position. Procedure Date: 01/17/2020.  Sonographer:     Tinnie Barefoot Referring Phys:  [id number] OZELL PRENTICE PASSEY Diagnosing Phys: Darryle Decent MD  PROCEDURE: After discussion of the risks and benefits of a TEE, an informed consent was obtained from the patient. TEE procedure  time was 20 minutes. The transesophogeal probe was passed without difficulty through the esophogus of the patient. Imaged were obtained with the patient in a left lateral decubitus position. Local oropharyngeal anesthetic was provided with Cetacaine . Sedation performed by different physician. The patient was monitored while under deep sedation. Anesthestetic sedation was provided intravenously by Anesthesiology: 270mg  of Propofol . Image quality was excellent. The patient's vital signs; including heart rate, blood pressure, and oxygen saturation; remained stable throughout the procedure. The patient developed no complications during the procedure. A successful direct current cardioversion was performed at 200 joules with 1 attempt.  IMPRESSIONS   1. Left ventricular ejection fraction, by estimation, is 55 to 60%. The left ventricle has normal function. The left ventricle has no regional wall motion abnormalities. 2. Right ventricular systolic function is normal. The right ventricular size is normal. 3. S/p surgical LAA clipping. No residual connection noted with the LA. No LA thrombus. No left atrial/left atrial appendage thrombus was detected. 4. 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 No residual MR. MVA by direct 3D MPR assessment 2.6 cm2. The mitral valve has been repaired/replaced. No evidence of mitral valve regurgitation. No evidence of mitral stenosis. The mean mitral valve gradient is 4.0 mmHg with average heart rate of 111 bpm. There is a 32 mm prosthetic annuloplasty ring present in the mitral position. Procedure Date: 01/17/2020. 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 6. The aortic valve is tricuspid. Aortic valve regurgitation is not visualized. No aortic stenosis is present.  Conclusion(s)/Recommendation(s): No LA/LAA thrombus identified. Successful cardioversion performed with restoration of normal sinus rhythm.  FINDINGS Left Ventricle: Left ventricular ejection fraction, by  estimation, is 55 to 60%. The left ventricle has normal function. The left ventricle has no regional wall motion abnormalities. The left ventricular internal cavity size was normal in size. There is no left ventricular hypertrophy.  Right Ventricle: The right ventricular size is normal. No increase in right ventricular wall thickness. Right ventricular systolic function is normal.  Left Atrium: S/p surgical LAA clipping. No residual connection noted with the LA. No LA thrombus. Left atrial size was normal in size. No left atrial/left atrial appendage thrombus was detected.  Right Atrium: Right atrial size was normal in size.  Pericardium: Trivial pericardial effusion is present.  Mitral Valve: S/p MV repair with 32 mm annuloplasty ring. No residual MR. MVA by direct 3D MPR assessment 2.6 cm2. The mitral valve has been repaired/replaced. No evidence of mitral valve regurgitation. There is a 32 mm  prosthetic annuloplasty ring present in the mitral position. Procedure Date: 01/17/2020. No evidence of mitral valve stenosis. The mean mitral valve gradient is 4.0 mmHg with average heart rate of 111 bpm.  Tricuspid Valve: The tricuspid valve is myxomatous. Tricuspid valve regurgitation is mild . No evidence of tricuspid stenosis.  Aortic Valve: The aortic valve is tricuspid. Aortic valve regurgitation is not visualized. No aortic stenosis is present.  Pulmonic Valve: The pulmonic valve was grossly normal. Pulmonic valve regurgitation is mild. No evidence of pulmonic stenosis.  Aorta: The aortic root, ascending aorta, aortic arch and descending aorta are all structurally normal, with no evidence of dilitation or obstruction.  Venous: The left upper pulmonary vein, left lower pulmonary vein and right upper pulmonary vein are normal.  IAS/Shunts: There is redundancy of the interatrial septum. No atrial level shunt detected by color flow Doppler.  Additional Comments: There is a small pleural effusion  in the left lateral region.    AORTA Ao Root diam: 3.20 cm Ao Asc diam:  2.80 cm  MITRAL VALVE           TRICUSPID VALVE MV Mean grad: 4.0 mmHg TR Peak grad:   25.0 mmHg TR Vmax:        250.00 cm/s  Darryle Decent MD Electronically signed by Darryle Decent MD Signature Date/Time: 01/26/2020/11:22:07 AM    Final        ______________________________________________________________________________________________     Recent Labs: 12/06/2023: Pro Brain Natriuretic Peptide 1,098.0 01/15/2024: ALT 29; BUN 23; Creatinine, Ser 0.97; Hemoglobin 12.5; Platelets 155; Potassium 3.9; Sodium 140 01/19/2024: TSH 1.59  Recent Lipid Panel    Component Value Date/Time   CHOL 175 12/10/2020 1338   TRIG 79.0 12/10/2020 1338   HDL 80.40 12/10/2020 1338   CHOLHDL 2 12/10/2020 1338   VLDL 15.8 12/10/2020 1338   LDLCALC 79 12/10/2020 1338   LDLDIRECT 136.5 06/08/2013 1049    History of Present Illness    78 year old female with the above past medical history including nonobstructive coronary artery disease, chronic dyspnea, paroxysmal atrial fibrillation, mitral valve regurgitation s/p minimally invasive mitral valve repair with MAZE procedure in 01/2020, hypertension, OSA, arthritis, BPPV, and severe anxiety.  She has a history of severe mitral valve regurgitation.  She underwent minimally invasive mitral valve repair, complete maze procedure, clipping of left atrial appendage, and sternotomy with repair of left ventricular wall laceration in July 2021.  Cardiac catheterization prior to surgery revealed 20% RCA stenosis, 20% LAD stenosis, mild nonobstructive CAD.  She has had chronic chest pain as well as chronic dyspnea since this time.  CT chest in August 2023 showedright middle lobe and left upper lobe nodularity suggestive of atypical infection, nodule in the left major fissure.she follows with pulmonology.  ABIs in 11/2022 in the setting of leg pain were normal.  She has a history of OSA on CPAP.   Most recent echocardiogram in 01/2023 showed EF 55 to 60%, normal LV function, no RWMA, mild LVH, moderately reduced RV systolic function, moderately enlarged right ventricle, moderately elevated PASP, 48.9 mmHg, moderately dilated right atrium, stable mitral valve repair, mild aortic valve regurgitation, aortic valve sclerosis without evidence of stenosis, moderate pulmonic valve regurgitation.  She has a history of paroxysmal atrial fibrillation.  She underwent DCCV in 10/2023, she was loaded with amiodarone  at the time.  She is no longer on anticoagulation in the setting of recurrent epistaxis.  She was last seen in the office on 02/01/2024 and reported ongoing dyspnea, persistent dizziness and  headaches.  She had planned follow-up with ENT.  She contacted our office on 03/09/2024 with complaints for ongoing shortness of breath, chest pain.     She presents today for follow-up.  Since her last visit  Chronic dyspnea/chest pain/Nonobstructive CAD: Paroxysmal atrial fibrillation: Mitral valve regurgitation: Hypertension: OSA: Anxiety: Disposition:  Home Medications    Current Outpatient Medications  Medication Sig Dispense Refill   acetaminophen  (TYLENOL ) 500 MG tablet Take 500 mg by mouth every 6 (six) hours as needed (pain.).     amiodarone  (PACERONE ) 200 MG tablet Take 200 mg by mouth daily.     aspirin  EC 81 MG tablet Take 1 tablet (81 mg total) by mouth daily. Swallow whole.     calcium  carbonate (OS-CAL) 1250 (500 Ca) MG chewable tablet Chew 1 tablet by mouth every evening.     Calcium  Carbonate Antacid (TUMS CHEWY BITES PO) Take 1 tablet by mouth daily as needed (reflux).      Carboxymethylcellul-Glycerin  (LUBRICATING EYE DROPS OP) Place 1 drop into both eyes 3 (three) times daily as needed (dry/irritated eys.).     cephALEXin  (KEFLEX ) 500 MG capsule Rake all 4 tablets 1 hour prior to dental procedure 4 capsule 6   Cholecalciferol (VITAMIN D -3 PO) Take 1 tablet by mouth every evening.      clonazePAM  (KLONOPIN ) 0.5 MG tablet Take 0.5 tablets (0.25 mg total) by mouth 2 (two) times daily as needed for anxiety. 30 tablet 3   cyanocobalamin  (VITAMIN B12) 1000 MCG/ML injection Inject 1,000 mcg into the muscle every 30 (thirty) days.     denosumab  (PROLIA ) 60 MG/ML SOSY injection Inject 60 mg into the skin every 6 (six) months.     furosemide  (LASIX ) 20 MG tablet Take 20 mg by mouth every Tuesday, Thursday, and Saturday at 6 PM. Per patient taking 60 mg on these days     furosemide  (LASIX ) 40 MG tablet Take 1 tablet (40 mg total) by mouth daily. 90 tablet 3   levalbuterol  (XOPENEX  HFA) 45 MCG/ACT inhaler Inhale 2 puffs into the lungs every 4 (four) hours as needed for wheezing. 1 each 6   levothyroxine  (SYNTHROID ) 75 MCG tablet Take 1 tablet (75 mcg total) by mouth daily. 90 tablet 0   loratadine (CLARITIN) 10 MG tablet Take 10 mg by mouth in the morning.     metoprolol  tartrate (LOPRESSOR ) 25 MG tablet Take 1 tablet by mouth twice daily 180 tablet 3   mupirocin ointment (BACTROBAN) 2 % Apply 1 Application topically daily.     nystatin  (MYCOSTATIN /NYSTOP ) powder Apply topically 2 (two) times daily as needed (skin irritation (under breasts)). 15 g 0   ondansetron  (ZOFRAN ) 8 MG tablet Take 1 tablet (8 mg total) by mouth every 8 (eight) hours as needed for nausea or vomiting. 45 tablet 1   ondansetron  (ZOFRAN -ODT) 4 MG disintegrating tablet Take 1 tablet (4 mg total) by mouth every 8 (eight) hours as needed for nausea or vomiting. 20 tablet 0   oxyCODONE  (ROXICODONE ) 5 MG/5ML solution Take 4 mLs (4 mg total) by mouth every 4 (four) hours as needed for moderate pain.  0   pantoprazole  (PROTONIX ) 40 MG tablet Take 1 tablet by mouth once daily 30 tablet 3   polyethylene glycol (MIRALAX  / GLYCOLAX ) packet Take 17 g by mouth daily as needed for mild constipation.     potassium chloride  SA (KLOR-CON  M) 20 MEQ tablet Take 1 tablet (20 mEq total) by mouth 3 (three) times a week. Take on Tuesdays,  Thursdays  and Saturdays when you take Lasix  60 mg.     revefenacin  (YUPELRI ) 175 MCG/3ML nebulizer solution Inhale one vial in nebulizer once daily. Do not mix with other nebulized medications. 3 mL 11   sodium chloride  (OCEAN) 0.65 % SOLN nasal spray Place 1 spray into both nostrils as needed for congestion (nose bleeds).     Ubrogepant (UBRELVY) 100 MG TABS Take 25 mg by mouth daily as needed (headaches/migraines).     valACYclovir  (VALTREX ) 500 MG tablet Take 500 mg by mouth daily as needed (breakouts).     No current facility-administered medications for this visit.     Review of Systems    ***.  All other systems reviewed and are otherwise negative except as noted above.    Physical Exam    VS:  There were no vitals taken for this visit. , BMI There is no height or weight on file to calculate BMI.     GEN: Well nourished, well developed, in no acute distress. HEENT: normal. Neck: Supple, no JVD, carotid bruits, or masses. Cardiac: RRR, no murmurs, rubs, or gallops. No clubbing, cyanosis, edema.  Radials/DP/PT 2+ and equal bilaterally.  Respiratory:  Respirations regular and unlabored, clear to auscultation bilaterally. GI: Soft, nontender, nondistended, BS + x 4. MS: no deformity or atrophy. Skin: warm and dry, no rash. Neuro:  Strength and sensation are intact. Psych: Normal affect.  Accessory Clinical Findings    ECG personally reviewed by me today -    - no acute changes.   Lab Results  Component Value Date   WBC 6.7 01/15/2024   HGB 12.5 01/15/2024   HCT 39.5 01/15/2024   MCV 97.1 01/15/2024   PLT 155 01/15/2024   Lab Results  Component Value Date   CREATININE 0.97 01/15/2024   BUN 23 01/15/2024   NA 140 01/15/2024   K 3.9 01/15/2024   CL 100 01/15/2024   CO2 30 01/15/2024   Lab Results  Component Value Date   ALT 29 01/15/2024   AST 42 (H) 01/15/2024   ALKPHOS 77 01/15/2024   BILITOT 1.1 01/15/2024   Lab Results  Component Value Date   CHOL 175  12/10/2020   HDL 80.40 12/10/2020   LDLCALC 79 12/10/2020   LDLDIRECT 136.5 06/08/2013   TRIG 79.0 12/10/2020   CHOLHDL 2 12/10/2020    Lab Results  Component Value Date   HGBA1C 5.8 (H) 10/03/2022    Assessment & Plan    1.  ***  No BP recorded.  {Refresh Note OR Click here to enter BP  :1}***   Damien JAYSON Braver, NP 03/11/2024, 9:34 AM

## 2024-03-14 ENCOUNTER — Other Ambulatory Visit: Payer: Self-pay | Admitting: Family Medicine

## 2024-03-14 DIAGNOSIS — M4693 Unspecified inflammatory spondylopathy, cervicothoracic region: Secondary | ICD-10-CM | POA: Diagnosis not present

## 2024-03-14 DIAGNOSIS — G894 Chronic pain syndrome: Secondary | ICD-10-CM | POA: Diagnosis not present

## 2024-03-14 DIAGNOSIS — Z79891 Long term (current) use of opiate analgesic: Secondary | ICD-10-CM | POA: Diagnosis not present

## 2024-03-14 DIAGNOSIS — M961 Postlaminectomy syndrome, not elsewhere classified: Secondary | ICD-10-CM | POA: Diagnosis not present

## 2024-03-14 DIAGNOSIS — M5412 Radiculopathy, cervical region: Secondary | ICD-10-CM | POA: Diagnosis not present

## 2024-03-15 NOTE — Patient Outreach (Signed)
 Complex Care Management   Visit Note  03/11/2024  Name:  [name] MRN: [id number] DOB: [birth year]  Situation: Referral received for Complex Care Management related to Anxiety, Chronic Pain Management, and High Risk for Falls. I obtained verbal consent from Patient.  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   Mitral regurgitation    Osteopenia 02/18/2012   Osteoporosis    PAF (paroxysmal atrial fibrillation) (HCC) 2012   Peripheral neuropathy 11/06/2011   PMDD (premenstrual dysphoric disorder)    PONV (postoperative nausea and vomiting)    nausea, vomiting, hives and dizziness    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/2020   Complete bilateral atrial lesion set using cryothermy and bipolar radiofrequency ablation with clipping of LA appendage via right mini-thoracotomy approach   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   Complex valvuloplasty including artificial Gore-tex neochord placement x12 with 32mm Sorin Memo 4D ring annuloplasty   Seasonal allergies    Vaginal delivery    ONE NSVD   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    Assessment: Patient Reported Symptoms:  Cognitive Cognitive Status: No symptoms reported, Normal speech and language skills, Insightful and able to interpret abstract concepts, Alert and oriented to person, place, and time Cognitive/Intellectual Conditions Management [RPT]: None reported or  documented in medical history or problem list      Neurological Neurological Review of Symptoms: No symptoms reported    HEENT HEENT Symptoms Reported: Change or loss of hearing HEENT Management Strategies: Coping strategies, Routine screening HEENT Self-Management Outcome: 3 (uncertain)    Cardiovascular Cardiovascular Symptoms Reported: No symptoms reported Does patient have uncontrolled Hypertension?: No    Respiratory Respiratory Symptoms Reported: Shortness of breath Other Respiratory Symptoms: chronic dyspnea Respiratory Management Strategies: Adequate rest, Breathing techniques, Medication therapy, Coping strategies, Routine screening Respiratory Self-Management Outcome: 3 (uncertain)  Endocrine Endocrine Symptoms Reported: No symptoms reported Is patient diabetic?: No Endocrine Self-Management Outcome: 4 (good)  Gastrointestinal Gastrointestinal Symptoms Reported: Constipation, Reflux/heartburn Additional Gastrointestinal Details: Regularly takes stool softeners d/t chronic use of opiods for chronic pain control Gastrointestinal Management Strategies: Adequate rest, Bowel program, Coping strategies    Genitourinary Genitourinary Symptoms Reported: No symptoms reported    Integumentary Integumentary Symptoms Reported: No symptoms reported    Musculoskeletal Musculoskelatal Symptoms Reviewed: Weakness, Limited mobility Musculoskeletal Management Strategies: Medical device, Coping strategies, Adequate rest, Routine screening Musculoskeletal Self-Management Outcome: 3 (uncertain) Musculoskeletal Comment: Taking Prolia  injections for osteoporosis Falls in the past year?: No    Psychosocial Psychosocial Symptoms Reported: Anxiety - if selected complete GAD Behavioral Management Strategies: Coping strategies, Medication therapy Behavioral Health Self-Management Outcome: 3 (uncertain) Major Change/Loss/Stressor/Fears (CP): Resources Techniques to Cope with Loss/Stress/Change:  Diversional activities, Medication Quality of Family Relationships: supportive Do you feel physically threatened by others?: No    03/15/2024    PHQ2-9 Depression Screening  Little interest or pleasure in doing things    Feeling down, depressed, or hopeless    PHQ-2 - Total Score    Trouble falling or staying asleep, or sleeping too much    Feeling tired or having little energy    Poor appetite or overeating     Feeling bad about yourself - or that you are a failure or have let yourself or your family down    Trouble concentrating on things, such as reading the newspaper or watching television    Moving or speaking so slowly that other people could have noticed.  Or the opposite - being so fidgety or restless that you have been moving around a lot more than usual    Thoughts that you would be better off dead, or hurting yourself in some way    PHQ2-9 Total Score    If you checked off any problems, how difficult have these problems made it for you to do your work, take care of things at home, or get along with other people    Depression Interventions/Treatment      There were no vitals filed for this visit.  Medications Reviewed Today     Reviewed by Gordy Channing LABOR, RN (Registered Nurse) on 03/11/24 at 1049  Med List Status: <None>   Medication Order Taking? Sig Documenting Provider Last Dose Status Informant  acetaminophen  (TYLENOL ) 500 MG tablet [id number] Yes Take 500 mg by mouth every 6 (six) hours as needed (pain.). [provider]  Active Self  amiodarone  (PACERONE ) 200 MG tablet [id number] Yes Take 200 mg by mouth daily. [provider]  Active   aspirin  EC 81 MG tablet [id number] Yes Take 1 tablet (81 mg total) by mouth daily. Swallow whole. Pietro Redell RAMAN, MD  Active   calcium  carbonate (OS-CAL) 1250 (500 Ca) MG chewable tablet [id number] Yes Chew 1 tablet by mouth every evening. [provider]  Active Self  Calcium  Carbonate Antacid (TUMS CHEWY  BITES PO) [id number] Yes Take 1 tablet by mouth daily as needed (reflux).  [provider]  Active Self  Carboxymethylcellul-Glycerin  (LUBRICATING EYE DROPS OP) [id number] Yes Place 1 drop into both eyes 3 (three) times daily as needed (dry/irritated eys.). [provider]  Active Self  cephALEXin  (KEFLEX ) 500 MG capsule [id number] Yes Rake all 4 tablets 1 hour prior to dental procedure Pietro Redell RAMAN, MD  Active   Cholecalciferol (VITAMIN D -3 PO) [id number] Yes Take 1 tablet by mouth every evening. [provider]  Active Self  clonazePAM  (KLONOPIN ) 0.5 MG tablet [id number] Yes Take 0.5 tablets (0.25 mg total) by mouth 2 (two) times daily as needed for anxiety. Tabori, Katherine E, MD  Active   cyanocobalamin  (VITAMIN B12) 1000 MCG/ML injection [id number] Yes Inject 1,000 mcg into the muscle every 30 (thirty) days. [provider]  Active Self  denosumab  (PROLIA ) 60 MG/ML SOSY injection [id number] Yes Inject 60 mg into the skin every 6 (six) months. [provider]  Active Self           Med Note JACKOLYN APOLINAR JONELLE Pablo Jun 11, 2020  2:33 PM)    furosemide  (LASIX ) 20 MG tablet [id number] Yes Take 20 mg by mouth every Tuesday, Thursday, and Saturday at 6 PM. Per patient taking 60 mg on these days [provider]  Active Self  furosemide  (LASIX ) 40 MG tablet [id number] Yes Take 1 tablet (40 mg total) by mouth daily. Pietro Redell RAMAN, MD  Active   levalbuterol  (  XOPENEX  HFA) 45 MCG/ACT inhaler [id number] Yes Inhale 2 puffs into the lungs every 4 (four) hours as needed for wheezing. Kara Dorn NOVAK, MD  Active   levothyroxine  (SYNTHROID ) 75 MCG tablet [id number] Yes Take 1 tablet (75 mcg total) by mouth daily. [name], MD  Active   loratadine (CLARITIN) 10 MG tablet [id number] Yes Take 10 mg by mouth in the morning. [provider]  Active Self  metoprolol  tartrate (LOPRESSOR ) 25 MG tablet [id number] Yes Take 1 tablet by mouth twice  daily Pietro Redell RAMAN, MD  Active Self  mupirocin ointment (BACTROBAN) 2 % [id number] Yes Apply 1 Application topically daily. [provider]  Active Self  nystatin  (MYCOSTATIN /NYSTOP ) powder [id number] Yes Apply topically 2 (two) times daily as needed (skin irritation (under breasts)). Maurice Sharlet RAMAN, PA-C  Active Self  ondansetron  (ZOFRAN ) 8 MG tablet [id number] Yes Take 1 tablet (8 mg total) by mouth every 8 (eight) hours as needed for nausea or vomiting. Tabori, Katherine E, MD  Active Self  ondansetron  (ZOFRAN -ODT) 4 MG disintegrating tablet [id number] Yes Take 1 tablet (4 mg total) by mouth every 8 (eight) hours as needed for nausea or vomiting. Ruthe Cornet, DO  Active   oxyCODONE  (ROXICODONE ) 5 MG/5ML solution [id number] Yes Take 4 mLs (4 mg total) by mouth every 4 (four) hours as needed for moderate pain. Maurice Sharlet RAMAN, PA-C  Active Self           Med Note JACKOLYN APOLINAR JONELLE Pablo Jun 11, 2020  2:33 PM)    pantoprazole  (PROTONIX ) 40 MG tablet [id number] Yes Take 1 tablet by mouth once daily Dewald, Jonathan B, MD  Active Self  polyethylene glycol (MIRALAX  / GLYCOLAX ) packet [id number] Yes Take 17 g by mouth daily as needed for mild constipation. [provider]  Active Self  potassium chloride  SA (KLOR-CON  M) 20 MEQ tablet [id number] Yes Take 1 tablet (20 mEq total) by mouth 3 (three) times a week. Take on Tuesdays, Thursdays and Saturdays when you take Lasix  60 mg. West, Katlyn D, NP  Active   revefenacin  (YUPELRI ) 175 MCG/3ML nebulizer solution [id number] Yes Inhale one vial in nebulizer once daily. Do not mix with other nebulized medications. Kara Dorn NOVAK, MD  Active   sodium chloride  (OCEAN) 0.65 % SOLN nasal spray [id number] Yes Place 1 spray into both nostrils as needed for congestion (nose bleeds). [provider]  Active Self  Ubrogepant (UBRELVY) 100 MG TABS [id number] Yes Take 25 mg by mouth daily as needed (headaches/migraines). [provider]   Active Self  valACYclovir  (VALTREX ) 500 MG tablet [id number] Yes Take 500 mg by mouth daily as needed (breakouts). [provider]  Active Self            Recommendation:   Continue Current Plan of Care  Follow Up Plan:   Telephone follow up appointment date/time:  03/21/2024 at 9 am with RN Care Management Channing LITTIE Channing A. Gordy RN, BA, [facility], CRRN   [facility] Population Health RN Care Manager Direct Dial: [phone]  Fax: [phone]

## 2024-03-15 NOTE — Telephone Encounter (Signed)
 Requested Prescriptions   Pending Prescriptions Disp Refills   meclizine  (ANTIVERT ) 25 MG tablet [Pharmacy Med Name: Meclizine  HCl 25 MG Oral Tablet] 45 tablet 0    Sig: TAKE 1 TABLET BY MOUTH THREE TIMES DAILY AS NEEDED FOR DIZZINESS     Date of patient request: 03/15/24  Last office visit: 02/22/2024 Upcoming visit: Visit date not found Date of last refill: 12/31/2023 Last refill amount: 45

## 2024-03-17 ENCOUNTER — Telehealth: Payer: Self-pay | Admitting: Cardiology

## 2024-03-17 DIAGNOSIS — M7918 Myalgia, other site: Secondary | ICD-10-CM | POA: Diagnosis not present

## 2024-03-17 DIAGNOSIS — M7912 Myalgia of auxiliary muscles, head and neck: Secondary | ICD-10-CM | POA: Diagnosis not present

## 2024-03-17 DIAGNOSIS — G894 Chronic pain syndrome: Secondary | ICD-10-CM | POA: Diagnosis not present

## 2024-03-17 DIAGNOSIS — G5603 Carpal tunnel syndrome, bilateral upper limbs: Secondary | ICD-10-CM | POA: Diagnosis not present

## 2024-03-17 DIAGNOSIS — M5414 Radiculopathy, thoracic region: Secondary | ICD-10-CM | POA: Diagnosis not present

## 2024-03-17 DIAGNOSIS — I6389 Other cerebral infarction: Secondary | ICD-10-CM | POA: Diagnosis not present

## 2024-03-17 DIAGNOSIS — G43711 Chronic migraine without aura, intractable, with status migrainosus: Secondary | ICD-10-CM | POA: Diagnosis not present

## 2024-03-17 DIAGNOSIS — G603 Idiopathic progressive neuropathy: Secondary | ICD-10-CM | POA: Diagnosis not present

## 2024-03-17 NOTE — Telephone Encounter (Signed)
 Pt is calling because her neurologist told her she has been having strokes. Neuro prescribed her Eliquis  but she is concerned about starting it. Pt is very upset and concerned. Please advise.   Pt c/o swelling/edema: STAT if pt has developed SOB within 24 hours  If swelling, where is the swelling located? Ankles   How much weight have you gained and in what time span? ~4 pounds    Have you gained 2 pounds in a day or 5 pounds in a week? No  Do you have a log of your daily weights (if so, list)? No  Are you currently taking a fluid pill? Yes  Are you currently SOB? No  Have you traveled recently in a car or plane for an extended period of time? No

## 2024-03-17 NOTE — Telephone Encounter (Signed)
 Spoke with pt who reports she has seen her neurologist who recommends pt restart Eliquis  2.5mg  due to recent mini-strokes.  Pt states she was advised to stop Eliquis  by Dr Pietro due to nosebleeds so she is not sure whose advice she should follow.  If Dr Pietro recommends pt restart Eliquis  she will need to apply for patient assistance again.    Pt also complains of recurrent ankle edema since her last visit with Dr Pietro.  She reports she takes Furosemide  60mg  3 days per week and Furosemide  40mg  4 days per week.  She states she tries to follow a low Na+ diet.  She does not wear her compression stockings due to difficulty getting them on.  Edema does resolve at night but returns quickly after being on her feet.  Pt notes she has gained 4 pounds over the last few weeks but she has been eating more bread trying to gain.  She is scheduled to see Aline Door, PA-C on 04/08/24.

## 2024-03-18 ENCOUNTER — Other Ambulatory Visit: Payer: Self-pay | Admitting: Licensed Clinical Social Worker

## 2024-03-18 MED ORDER — APIXABAN 2.5 MG PO TABS: 2.5000 mg | ORAL_TABLET | Freq: Two times a day (BID) | ORAL | 11 refills | Status: DC

## 2024-03-18 NOTE — Telephone Encounter (Signed)
Spoke with pt, Aware of dr crenshaw's recommendations. New script sent to the pharmacy  

## 2024-03-18 NOTE — Patient Outreach (Signed)
 Complex Care Management   Visit Note  03/18/2024  Name:  [name] MRN: [id number] DOB: [birth year]  Situation: Referral received for Complex Care Management related to Mental/Behavioral Health diagnosis anxiety. I obtained verbal consent from Patient.  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   Mitral regurgitation    Osteopenia 02/18/2012   Osteoporosis    PAF (paroxysmal atrial fibrillation) (HCC) 2012   Peripheral neuropathy 11/06/2011   PMDD (premenstrual dysphoric disorder)    PONV (postoperative nausea and vomiting)    nausea, vomiting, hives and dizziness    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/2020   Complete bilateral atrial lesion set using cryothermy and bipolar radiofrequency ablation with clipping of LA appendage via right mini-thoracotomy approach   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   Complex valvuloplasty including artificial Gore-tex neochord placement x12 with 32mm Sorin Memo 4D ring annuloplasty   Seasonal allergies    Vaginal delivery    ONE NSVD   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    Assessment: Patient Reported Symptoms:  Cognitive Cognitive Status: Struggling with memory recall, Able to follow simple commands Cognitive/Intellectual Conditions Management [RPT]: Not Assessed   Health Maintenance Behaviors: Annual physical exam Healing Pattern: Unsure Health Facilitated by: Rest,  Stress management  Neurological Neurological Review of Symptoms: No symptoms reported Neurological Management Strategies: Adequate rest, Medication therapy, Coping strategies, Routine screening Neurological Self-Management Outcome: 3 (uncertain)  Psychosocial Psychosocial Symptoms Reported: Anxiety - if selected complete GAD Additional Psychological Details: States 3 main stressors in order of priority - #1 my health, #2  my son's health - he has a lot of problems and was recently discharged from hospital for issues relating to his back , # finances. Pateint's referral to [facility] was denied as they felt like she did not meet their program criteria. Other BH options were discussed with patient and find help referrals placed Behavioral Management Strategies: Coping strategies, Medication therapy Behavioral Health Self-Management Outcome: 4 (good) Behavioral Health Comment: Patient reports that her mood has been better the last few days Major Change/Loss/Stressor/Fears (CP): Resources Quality of Family Relationships: supportive Do you feel physically threatened by others?: No    03/18/2024    PHQ2-9 Depression Screening   Little interest or pleasure in doing things Several days  Feeling down, depressed, or hopeless Several days  PHQ-2 - Total Score 2  Trouble falling or staying asleep, or sleeping too much Several days  Feeling tired or having little energy More than half the days  Poor appetite or overeating  Not at all  Feeling bad about yourself - or that you are a failure or have let yourself or your family down Not at all  Trouble concentrating on things, such as reading the newspaper or watching television Not at all  Moving or speaking so slowly that  other people could have noticed.  Or the opposite - being so fidgety or restless that you have been moving around a lot more than usual Not at all  Thoughts that you would be better off dead, or hurting yourself in some way Not at all   PHQ2-9 Total Score 5  If you checked off any problems, how difficult have these problems made it for you to do your work, take care of things at home, or get along with other people Somewhat difficult  Depression Interventions/Treatment Referral to Psychiatry      01/19/2024    2:15 PM 11/13/2023    9:48 AM 07/24/2023    3:16 PM 02/26/2023   10:27 AM  GAD 7 : Generalized Anxiety Score  Nervous, Anxious, on Edge 3 1 3  0  Control/stop worrying 2 1 1  0  Worry too much - different things 1 1 1  0  Trouble relaxing 2 1 1  0  Restless 1 0 0 0  Easily annoyed or irritable 1 0 0 0  Afraid - awful might happen 1 0 1 0  Total GAD 7 Score 11 4 7  0  Anxiety Difficulty Very difficult Not difficult at all  Not difficult at all    SDOH Screenings   Food Insecurity: No Food Insecurity (03/09/2024)  Housing: Unknown (03/09/2024)  Transportation Needs: No Transportation Needs (03/09/2024)  Utilities: Not At Risk (03/09/2024)  Alcohol  Screen: Low Risk  (10/23/2022)  Depression (PHQ2-9): Medium Risk (03/18/2024)  Financial Resource Strain: High Risk (03/09/2024)  Physical Activity: Inactive (10/23/2022)  Social Connections: Socially Isolated (10/23/2022)  Stress: Stress Concern Present (03/18/2024)  Tobacco Use: Medium Risk (02/26/2024)  Health Literacy: Adequate Health Literacy (10/12/2023)   There were no vitals filed for this visit.  Medications Reviewed Today     Reviewed by Merlynn Lyle CROME, LCSW (Social Worker) on 03/18/24 at 1106  Med List Status: <None>   Medication Order Taking? Sig Documenting Provider Last Dose Status Informant  acetaminophen  (TYLENOL ) 500 MG tablet [id number]  Take 500 mg by mouth every 6 (six) hours as needed (pain.). [provider]  Active Self  amiodarone  (PACERONE ) 200 MG tablet [id number]  Take 200 mg by mouth daily. [provider]  Active   apixaban  (ELIQUIS ) 2.5 MG TABS tablet [id number]  Take 1 tablet (2.5 mg total) by mouth 2 (two) times daily. Pietro Redell RAMAN, MD  Active   aspirin  EC 81 MG tablet [id number]  Take 1 tablet (81 mg total) by mouth daily. Swallow whole. Pietro Redell RAMAN, MD  Active   calcium  carbonate (OS-CAL) 1250 (500 Ca) MG chewable tablet [id number]  Chew 1 tablet by mouth every evening. [provider]  Active Self  Calcium  Carbonate Antacid (TUMS CHEWY BITES PO) [id number]  Take 1 tablet by mouth daily as needed (reflux).  [provider]  Active Self  Carboxymethylcellul-Glycerin  (LUBRICATING EYE DROPS OP) [id number]  Place 1 drop into both eyes 3 (three) times daily as needed (dry/irritated eys.). [provider]  Active Self  cephALEXin  (KEFLEX ) 500 MG capsule [id number]  Rake all 4 tablets 1 hour prior to dental procedure Pietro Redell RAMAN, MD  Active   Cholecalciferol (VITAMIN D -3 PO) [id number]  Take 1 tablet by mouth every evening. [provider]  Active Self  clonazePAM  (KLONOPIN ) 0.5 MG tablet [id number]  Take 0.5 tablets (0.25 mg total) by mouth 2 (two) times daily as needed for anxiety. Tabori, Katherine E, MD  Active   cyanocobalamin  (VITAMIN B12) 1000 MCG/ML injection  [id number]  Inject 1,000 mcg into the muscle every 30 (thirty) days. [provider]  Active Self  denosumab  (PROLIA ) 60 MG/ML SOSY injection [id number]  Inject 60 mg into the skin every 6 (six) months. [provider]  Active Self           Med Note JACKOLYN APOLINAR JONELLE Pablo Jun 11, 2020  2:33 PM)    furosemide  (LASIX ) 20 MG tablet [id number]  Take 20 mg by mouth every Tuesday, Thursday, and Saturday at 6 PM. Per patient taking 60 mg on these days [provider]  Active Self  furosemide  (LASIX ) 40 MG tablet [id number]  Take 1 tablet (40 mg total) by mouth daily. Pietro Redell RAMAN, MD  Active   levalbuterol  (XOPENEX  HFA) 45 MCG/ACT inhaler [id number]  Inhale 2 puffs into the lungs every 4 (four) hours as needed for wheezing. Kara Dorn NOVAK, MD  Active   levothyroxine  (SYNTHROID ) 75 MCG tablet [id number]   Take 1 tablet (75 mcg total) by mouth daily. [name], MD  Active   loratadine (CLARITIN) 10 MG tablet [id number]  Take 10 mg by mouth in the morning. [provider]  Active Self  meclizine  (ANTIVERT ) 25 MG tablet [id number]  TAKE 1 TABLET BY MOUTH THREE TIMES DAILY AS NEEDED FOR DIZZINESS Tabori, Katherine E, MD  Active   metoprolol  tartrate (LOPRESSOR ) 25 MG tablet [id number]  Take 1 tablet by mouth twice daily Pietro Redell RAMAN, MD  Active Self  mupirocin ointment (BACTROBAN) 2 % [id number]  Apply 1 Application topically daily. [provider]  Active Self  nystatin  (MYCOSTATIN /NYSTOP ) powder [id number]  Apply topically 2 (two) times daily as needed (skin irritation (under breasts)). Maurice Sharlet RAMAN, PA-C  Active Self  ondansetron  (ZOFRAN ) 8 MG tablet [id number]  Take 1 tablet (8 mg total) by mouth every 8 (eight) hours as needed for nausea or vomiting. Tabori, Katherine E, MD  Active Self  ondansetron  (ZOFRAN -ODT) 4 MG disintegrating tablet [id number]  Take 1 tablet (4 mg total) by mouth every 8 (eight) hours as needed for nausea or vomiting. Ruthe Cornet, DO  Active   oxyCODONE  (ROXICODONE ) 5 MG/5ML solution [id number]  Take 4 mLs (4 mg total) by mouth every 4 (four) hours as needed for moderate pain. Maurice Sharlet RAMAN, PA-C  Active Self           Med Note JACKOLYN APOLINAR JONELLE Pablo Jun 11, 2020  2:33 PM)    pantoprazole  (PROTONIX ) 40 MG tablet [id number]  Take 1 tablet by mouth once daily Dewald, Jonathan B, MD  Active Self  polyethylene glycol (MIRALAX  / GLYCOLAX ) packet [id number]  Take 17 g by mouth daily as needed for mild constipation. [provider]  Active Self  potassium chloride  SA (KLOR-CON  M) 20 MEQ tablet [id number]  Take 1 tablet (20 mEq total) by mouth 3 (three) times a week. Take on Tuesdays, Thursdays and Saturdays when you take Lasix  60 mg. West, Katlyn D, NP  Active   revefenacin  (YUPELRI ) 175 MCG/3ML nebulizer solution [id number]  Inhale one vial in  nebulizer once daily. Do not mix with other nebulized medications. Kara Dorn NOVAK, MD  Active   sodium chloride  (OCEAN) 0.65 % SOLN nasal spray [id number]  Place 1 spray into both nostrils as needed for congestion (nose bleeds). [provider]  Active Self  Ubrogepant (UBRELVY) 100 MG TABS [id number]  Take 25 mg by mouth daily as needed (headaches/migraines). [provider]  Active Self  valACYclovir  (VALTREX )  500 MG tablet [id number]  Take 500 mg by mouth daily as needed (breakouts). [provider]  Active Self            Recommendation:   PCP Follow-up Specialty provider follow-up Patient was sent several BH options through find help to consider for [facility] support  Follow Up Plan:   Telephone follow-up in 1 month  Lyle Rung, BSW, MSW, LCSW Licensed Clinical Social Worker [employer]   [email or web address] Direct Dial: [phone]

## 2024-03-18 NOTE — Patient Instructions (Signed)
 Visit Information  Thank you for taking time to visit with me today. Please don't hesitate to contact me if I can be of assistance to you before our next scheduled appointment.  Our next appointment is by telephone on 04/06/24 at 3 Please call the care guide team at [phone] if you need to cancel or reschedule your appointment.   Following is a copy of your care plan:   Goals Addressed             This Visit's Progress    LCSW VBCI Social Work Care Plan       Problems:  Chronic Disease Management support and education needs related to High Risk for Falls  Goal: Over the next 3 months the Patient will demonstrate ongoing self health (physical and mental) care management ability and gain necessary behavioral health support and resource connection as evidenced by successfully engaging with [facility] services.   Interventions:   Depressive/Anxiety Interventions:  Current barriers:   Chronic Mental Health needs related to symptoms of depression, stress and anxiety. Patient requires Support, Education, Resources, Referrals, Advocacy, and Care Coordination, in order to meet Unmet Mental Health Needs and to find a therapist and psychiatrist. Mental Health Concerns and Social Isolation Patient lacks knowledge of local and available community resources and counseling agencies.   Clinical Goal(s): verbalize understanding of plan for management of Anxiety, Depression, and Stress symptoms and demonstrate a reduction in symptoms. Patient will connect with a provider for ongoing mental health treatment, increase coping skills, healthy habits, self-management skills, and stress reduction      Patient will implement clinical interventions discussed today to decrease symptoms of depression and increase knowledge and/or ability of: coping skills.    Clinical Interventions:  Assessed patient's previous and current treatment, coping skills, support system and barriers to care. Patient provided hx   Verbalization of feelings encouraged, motivational interviewing employed Emotional support provided, positive coping strategies explored. Establishing healthy boundaries emphasized and healthy self-care education provided Patient was educated on available mental health resources within their area that accept her insurance and offer counseling and psychiatry. Patient was advised to contact the back of her insurance card for assistance with benefits as well. Patient is agreeable to referral to [facility] for counseling and psychiatry. [facility] LCSW made referral on 03/03/24 but this referral was denied.  Emotional support provided. CBT intervention implemented regarding being mentally fit by combating negative thinking and replacing it with uplifting support, hope and positivity. Assessed social determinant of health barriers Discussed use of relaxation techniques and/or diversional activities to assist with pain reduction (distraction, imagery, relaxation, massage, acupressure, TENS, heat, and cold application; Reviewed with patient prescribed pharmacological and nonpharmacological pain relief strategies; Patient will work on implementing appropriate self-care habits into their daily routine such as: staying positive, writing a gratitude list, drinking water, staying active around the house, taking their medications as prescribed, combating negative thoughts or emotions and staying connected with their family and friends. Positive reinforcement provided for this decision to work on this. Motivational Interviewing employed Depression screen reviewed  PHQ2/ PHQ9 completed or reviewed  Mindfulness or Relaxation training provided Active listening / Reflection utilized  Emotional Support Provided Problem Solving /Task Center strategies reviewed Provided psychoeducation for mental health needs  Provided brief CBT  Reviewed mental health medications and discussed importance of compliance:  Quality of sleep  assessed & Sleep Hygiene techniques promoted  Participation in counseling encouraged  Verbalization of feelings encouraged  Suicidal Ideation/Homicidal Ideation assessed: Patient denies SI/HI  Review resources, discussed options and provided patient information about  Mental Health Resources Inter-disciplinary care team collaboration (see longitudinal plan of care) Referral to VBCI BSW for SDOH needs Referral to [facility] was denied as unfortunately, we believe patient needs a higher level of care. It has been recommended to see a Geriatric Psych. VBCI LCSW will sent update to PCP and inquire. Find help logged referrals placed on 03/18/24.   Patient Self-Care Activities:  Attend all scheduled provider appointments Call provider office for new concerns or questions  Perform all self care activities independently  Take medications as prescribed   Work with the social worker to address care coordination needs and will continue to work with the clinical team to address health care, SDOH barriers and disease management related needs  Plan:  The patient has been provided with contact information for the care management team and has been advised to call with any health related questions or concerns.  Referral to BSW to help patient manage daily financial stressors better Next RN Care Management appointment with Channing is on 03/21/2024 at 9 am. Next BSW Care Management appointment with Orlean is on 03/22/2024 at 2:30 pm.              Please call the Suicide and Crisis Lifeline: 988 call the [place]  National Suicide Prevention Lifeline: 218-425-6103 or TTY: 408-692-9375 TTY 878-344-2847) to talk to a trained counselor call 1-800-273-TALK (toll free, 24 hour hotline) go to [facility] [address] [place] [phone]) call 911 if you are experiencing a Mental Health or Behavioral Health Crisis or need someone to talk to.  Patient verbalizes  understanding of instructions and care plan provided today and agrees to view in MyChart. Active MyChart status and patient understanding of how to access instructions and care plan via MyChart confirmed with patient.     Lyle Rung, BSW, MSW, LCSW Licensed Clinical Social Worker [employer]   [facility] [email or web address] Direct Dial: [phone]

## 2024-03-21 ENCOUNTER — Telehealth: Payer: Self-pay | Admitting: Cardiology

## 2024-03-21 ENCOUNTER — Other Ambulatory Visit: Payer: Self-pay

## 2024-03-21 NOTE — Telephone Encounter (Signed)
Left detailed message for patient of dr crenshaw's recommendations. 

## 2024-03-21 NOTE — Telephone Encounter (Signed)
 Pt c/o medication issue:  1. Name of Medication:   apixaban  (ELIQUIS ) 2.5 MG TABS tablet    2. How are you currently taking this medication (dosage and times per day)? As written   3. Are you having a reaction (difficulty breathing--STAT)? No   4. What is your medication issue? Pt called in asking since she has restarted Eliquis  should she continue taking aspirin  as well. Please advise.

## 2024-03-21 NOTE — Telephone Encounter (Signed)
 Patient returned call and will be available up until 2:15 pm and anytime after 3:30 pm   CB#[phone]

## 2024-03-21 NOTE — Telephone Encounter (Signed)
 I spoke with patient and gave her message from Dr Pietro to stop aspirin . Patient reports she is scheduled for filling at dentist office today.  I told her to make dentist aware of recent stroke diagnosis and medication changes. Patient reports she is planning to have cataract surgery in October.  Has not been scheduled yet and has appointment to discuss in October.  I told her to make cataract surgeon aware of recent stroke diagnosis and medication changes. Patient reports she takes one potassium on days she takes furosemide  60 mg.  She reports Dr Pietro told her she could take additional 20 mg tablet of lasix  as needed on days she takes furosemide  40 mg.  She is asking if she should take potassium on days she takes extra prn lasix .  I told her to take one potassium when taking extra lasix 

## 2024-03-21 NOTE — Patient Instructions (Signed)
 Visit Information  Thank you for taking time to visit with me today. Please don't hesitate to contact me if I can be of assistance to you before our next scheduled telephone appointment.  Our next appointment is by telephone on 04/04/24 at 10:00 am  Following is a copy of your care plan:   Goals Addressed             This Visit's Progress    VBCI RN Care Plan       Problems:  Chronic Disease Management support and education needs related to High Risk for Falls  Goal: Over the next 3 months the Patient will demonstrate ongoing self health care management ability and take all Fall Precautions necessary to ensure no further falls as evidenced by    no further falls  Interventions:   Falls Interventions: Provided written and verbal education re: potential causes of falls and Fall prevention strategies Reviewed medications and discussed potential side effects of medications such as dizziness and frequent urination Advised patient of importance of notifying provider of falls Assessed for signs and symptoms of orthostatic hypotension Assessed patients knowledge of fall risk prevention secondary to previously provided education Provided patient information for fall alert systems  Patient Self-Care Activities:  Attend all scheduled provider appointments Call provider office for new concerns or questions  Perform all self care activities independently  Take medications as prescribed   Work with the social worker to address care coordination needs and will continue to work with the clinical team to address health care and disease management related needs 02/29/24 RNCM engagement - patient reports no falls since last RNCM call on 02/15/24. Continues to complain of chronic pain and takes oxycodone  as needed as well as 1/2 tab (= dose of 0.25mg ) of Klonopin  up to 2Xs/day. 03/04/24 RNCM engagement - patient reports no falls and that she did connnect with the LCSW, Lyle Rung yesterday to  discuss ways to manage stress. We also talked about the difference between the LCSW role and the BSW role of our Social Workers and that patient's appt with Orlean Fey on 03/09/24 will focus on patient's SDOH barriers and financial challenges. Patient is eager to talk to the BSW about possible resources available to her and her disabled son who lives with her. 03/11/2024 RNCM f/u - patient states her health has been stable. Patient also expressed appreciation for BSW referral - felt Orlean Fey was very helpful and states she is awaiting some materials sent to her by the BSW regarding SDOH resources patient may be able to utilize/apply for  Plan:  The patient has been provided with contact information for the care management team and has been advised to call with any health related questions or concerns.  Referral to LCSW to help patient manage daily stressors better Next RN Care Management appointment with Channing is on 04/04/2024 at 10:00 am.             Patient verbalizes understanding of instructions and care plan provided today and agrees to view in MyChart. Active MyChart status and patient understanding of how to access instructions and care plan via MyChart confirmed with patient.     The patient has been provided with contact information for the care management team and has been advised to call with any health related questions or concerns.   Please call the care guide team at [phone] if you need to cancel or reschedule your appointment.   Please call 1-800-273-TALK (toll free, 24 hour hotline) if  you are experiencing a Mental Health or Behavioral Health Crisis or need someone to talk to.  .cals

## 2024-03-21 NOTE — Patient Outreach (Signed)
 Complex Care Management   Visit Note  03/21/2024  Name:  [name] MRN: [id number] DOB: 14-Dec-1945  Situation: Referral received for Complex Care Management related to Atrial Fibrillation and Anxiety/Depression, Chronic Dyspnea, Chronic Pain, and High Risk for Falls. I obtained verbal consent from Patient.  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   Mitral regurgitation    Osteopenia 02/18/2012   Osteoporosis    PAF (paroxysmal atrial fibrillation) (HCC) 2012   Peripheral neuropathy 11/06/2011   PMDD (premenstrual dysphoric disorder)    PONV (postoperative nausea and vomiting)    nausea, vomiting, hives and dizziness    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/2020   Complete bilateral atrial lesion set using cryothermy and bipolar radiofrequency ablation with clipping of LA appendage via right mini-thoracotomy approach   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 due to feeling less stressed and  anxious) Cognitive/Intellectual Conditions Management [RPT]: None reported or documented in medical history or problem list   Health Maintenance Behaviors: Annual physical exam, Stress management Healing Pattern: Unsure Health Facilitated by: Stress management, Rest, Prayer/meditation  Neurological Neurological Review of Symptoms: No symptoms reported    HEENT HEENT Symptoms Reported: Not assessed      Cardiovascular Cardiovascular Symptoms Reported: No symptoms reported Does patient have uncontrolled Hypertension?: No Cardiovascular Management Strategies: Medication therapy, Coping strategies, Adequate rest, Weight management, Diet modification Do You Have a Working Readable Scale?: Yes Cardiovascular Self-Management Outcome: 3 (uncertain)  Respiratory Respiratory Symptoms Reported: Shortness of breath Other Respiratory Symptoms: chronic dyspnea Respiratory Management Strategies: Adequate rest, Breathing techniques, Medication therapy, Coping strategies, Routine screening Respiratory Self-Management Outcome: 3 (uncertain)  Endocrine Endocrine Symptoms Reported: No symptoms reported Is patient diabetic?: No Endocrine Self-Management Outcome: 4 (good)  Gastrointestinal Gastrointestinal Symptoms Reported: Constipation, Reflux/heartburn Gastrointestinal Self-Management Outcome: 4 (good)    Genitourinary Genitourinary Symptoms Reported: No symptoms reported    Integumentary Integumentary Symptoms Reported: No symptoms reported    Musculoskeletal Musculoskelatal Symptoms Reviewed: Weakness, Limited mobility Musculoskeletal Management Strategies: Medical device, Coping strategies, Adequate rest, Routine screening Musculoskeletal Self-Management Outcome: 3 (uncertain) Falls in the past year?: No Number of falls in past year: 1 or less Was there an injury with Fall?: No Fall Risk Category Calculator: 0 Patient Fall Risk Level: Low Fall Risk    Psychosocial Psychosocial Symptoms  Reported: No symptoms reported     Do you feel physically threatened by others?: No    03/21/2024  PHQ2-9 Depression Screening   Little interest or pleasure in doing things    Feeling down, depressed, or hopeless    PHQ-2 - Total Score    Trouble falling or staying asleep, or sleeping too much    Feeling tired or having little energy    Poor appetite or overeating     Feeling bad about yourself - or that you are a failure or have let yourself or your family down    Trouble concentrating on things, such as reading the newspaper or watching television    Moving or speaking so slowly that other people could have noticed.  Or the opposite - being so fidgety or restless that you have been moving around a lot more than usual    Thoughts that you would be better off dead, or hurting yourself in some way    PHQ2-9 Total Score    If you checked off any problems, how difficult have these problems made it for you to do your work, take care of things at home, or get along with other people    Depression Interventions/Treatment      There were no vitals filed for this visit.  Medications Reviewed Today     Reviewed by Gordy Channing LABOR, RN (Registered Nurse) on 03/21/24 at [phone]  Med List Status: <None>   Medication Order Taking? Sig Documenting Provider Last Dose Status Informant  acetaminophen  (TYLENOL ) 500 MG tablet [id number] Yes Take 500 mg by mouth every 6 (six) hours as needed (pain.). [provider]  Active Self  amiodarone  (PACERONE ) 200 MG tablet [id number] Yes Take 200 mg by mouth daily. [provider]  Active   apixaban  (ELIQUIS ) 2.5 MG TABS tablet [id number] Yes Take 1 tablet (2.5 mg total) by mouth 2 (two) times daily. Pietro Redell RAMAN, MD  Active   aspirin  EC 81 MG tablet [id number] Yes Take 1 tablet (81 mg total) by mouth daily. Swallow whole. Pietro Redell RAMAN, MD  Active   calcium  carbonate (OS-CAL) 1250 (500 Ca) MG chewable tablet [id number] Yes Chew 1 tablet  by mouth every evening. [provider]  Active Self  Calcium  Carbonate Antacid (TUMS CHEWY BITES PO) [id number] Yes Take 1 tablet by mouth daily as needed (reflux).  [provider]  Active Self  Carboxymethylcellul-Glycerin  (LUBRICATING EYE DROPS OP) [id number] Yes Place 1 drop into both eyes 3 (three) times daily as needed (dry/irritated eys.). [provider]  Active Self  cephALEXin  (KEFLEX ) 500 MG capsule [id number] Yes Rake all 4 tablets 1 hour prior to dental procedure Pietro Redell RAMAN, MD  Active   Cholecalciferol (VITAMIN D -3 PO) [id number] Yes Take 1 tablet by mouth every evening. [provider]  Active Self  clonazePAM  (KLONOPIN ) 0.5 MG tablet [id number] Yes Take 0.5 tablets (0.25 mg total) by mouth 2 (two) times daily as needed for anxiety. Tabori, Katherine E, MD  Active   cyanocobalamin  (VITAMIN B12) 1000 MCG/ML injection [id number] Yes Inject 1,000 mcg into the muscle every 30 (thirty) days. [provider]  Active Self  denosumab  (PROLIA ) 60 MG/ML SOSY injection [id number] Yes Inject 60 mg into the skin every 6 (six) months. [provider]  Active Self           Med Note JACKOLYN APOLINAR JONELLE Pablo Jun 11, 2020  2:33 PM)    furosemide  (LASIX ) 20 MG tablet [id number] Yes Take 20 mg by mouth every Tuesday, Thursday, and Saturday at 6 PM. Per patient taking 60 mg on these  days [provider]  Active Self  furosemide  (LASIX ) 40 MG tablet [id number] Yes Take 1 tablet (40 mg total) by mouth daily. Pietro Redell RAMAN, MD  Active   levalbuterol  (XOPENEX  HFA) 45 MCG/ACT inhaler [id number] Yes Inhale 2 puffs into the lungs every 4 (four) hours as needed for wheezing. Kara Dorn NOVAK, MD  Active   levothyroxine  (SYNTHROID ) 75 MCG tablet [id number] Yes Take 1 tablet (75 mcg total) by mouth daily. [name], MD  Active   loratadine (CLARITIN) 10 MG tablet [id number] Yes Take 10 mg by mouth in the morning. [provider]   Active Self  meclizine  (ANTIVERT ) 25 MG tablet [id number] Yes TAKE 1 TABLET BY MOUTH THREE TIMES DAILY AS NEEDED FOR DIZZINESS Tabori, Katherine E, MD  Active   metoprolol  tartrate (LOPRESSOR ) 25 MG tablet [id number] Yes Take 1 tablet by mouth twice daily Pietro Redell RAMAN, MD  Active Self  mupirocin ointment (BACTROBAN) 2 % [id number] Yes Apply 1 Application topically daily. [provider]  Active Self  nystatin  (MYCOSTATIN /NYSTOP ) powder [id number] Yes Apply topically 2 (two) times daily as needed (skin irritation (under breasts)). Maurice Sharlet RAMAN, PA-C  Active Self  ondansetron  (ZOFRAN ) 8 MG tablet [id number] Yes Take 1 tablet (8 mg total) by mouth every 8 (eight) hours as needed for nausea or vomiting. Tabori, Katherine E, MD  Active Self  ondansetron  (ZOFRAN -ODT) 4 MG disintegrating tablet [id number] Yes Take 1 tablet (4 mg total) by mouth every 8 (eight) hours as needed for nausea or vomiting. Ruthe Cornet, DO  Active   oxyCODONE  (ROXICODONE ) 5 MG/5ML solution [id number] Yes Take 4 mLs (4 mg total) by mouth every 4 (four) hours as needed for moderate pain. Maurice Sharlet RAMAN, PA-C  Active Self           Med Note JACKOLYN APOLINAR JONELLE Pablo Jun 11, 2020  2:33 PM)    pantoprazole  (PROTONIX ) 40 MG tablet [id number] Yes Take 1 tablet by mouth once daily Dewald, Jonathan B, MD  Active Self  polyethylene glycol (MIRALAX  / GLYCOLAX ) packet [id number] Yes Take 17 g by mouth daily as needed for mild constipation. [provider]  Active Self  potassium chloride  SA (KLOR-CON  M) 20 MEQ tablet [id number] Yes Take 1 tablet (20 mEq total) by mouth 3 (three) times a week. Take on Tuesdays, Thursdays and Saturdays when you take Lasix  60 mg. West, Katlyn D, NP  Active   revefenacin  (YUPELRI ) 175 MCG/3ML nebulizer solution [id number] Yes Inhale one vial in nebulizer once daily. Do not mix with other nebulized medications. Kara Dorn NOVAK, MD  Active   sodium chloride  (OCEAN) 0.65 % SOLN nasal spray [id number]  Yes Place 1 spray into both nostrils as needed for congestion (nose bleeds). [provider]  Active Self  Ubrogepant (UBRELVY) 100 MG TABS [id number] Yes Take 25 mg by mouth daily as needed (headaches/migraines). [provider]  Active Self  valACYclovir  (VALTREX ) 500 MG tablet [id number] Yes Take 500 mg by mouth daily as needed (breakouts). [provider]  Active Self            Recommendation:   Continue Current Plan of Care  Follow Up Plan:   Telephone follow up appointment date/time:  04/04/24 at 10:00 am  Jamelyn Bovard A. Gordy RN, BA, [facility], CRRN [place]  [facility] RN Care Manager Direct Dial: [phone]  Fax: [phone]

## 2024-03-22 ENCOUNTER — Other Ambulatory Visit: Payer: Self-pay

## 2024-03-22 NOTE — Telephone Encounter (Signed)
 Patient states she coming back for update. Please advise

## 2024-03-22 NOTE — Telephone Encounter (Signed)
 Patient states she was returning call from our office. No documentation of anyone from our office calling patient today noted in chart.  Reviewed message from triage nurse yesterday. Patient verbalized understanding.

## 2024-03-22 NOTE — Patient Instructions (Signed)
 Visit Information  Thank you for taking time to visit with me today. Please don't hesitate to contact me if I can be of assistance to you before our next scheduled appointment.  Your next care management appointment is by telephone on 9/22 at 2pm  Telephone follow-up 9/22 at 2pm  Please call the care guide team at [phone] if you need to cancel, schedule, or reschedule an appointment.   Please call the Suicide and Crisis Lifeline: 988 call the [place]  National Suicide Prevention Lifeline: 508-772-7704 or TTY: 972-798-0005 TTY 463-812-3431) to talk to a trained counselor call 1-800-273-TALK (toll free, 24 hour hotline) call 911 if you are experiencing a Mental Health or Behavioral Health Crisis or need someone to talk to.  Orlean Fey, BSW [place]  [facility] Social Worker, [facility] [phone]

## 2024-03-22 NOTE — Patient Outreach (Signed)
 Complex Care Management   Visit Note  03/22/2024  Name:  [name] MRN: [id number] DOB: 07/06/1946  Situation: Referral received for Complex Care Management related to SDOH Barriers:  Transportation I obtained verbal consent from Patient.  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   Mitral regurgitation    Osteopenia 02/18/2012   Osteoporosis    PAF (paroxysmal atrial fibrillation) (HCC) 2012   Peripheral neuropathy 11/06/2011   PMDD (premenstrual dysphoric disorder)    PONV (postoperative nausea and vomiting)    nausea, vomiting, hives and dizziness    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/2020   Complete bilateral atrial lesion set using cryothermy and bipolar radiofrequency ablation with clipping of LA appendage via right mini-thoracotomy approach   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 Patient confirmed that she had received the resources and had been reviewing them. She also stated that she needed transportation for her upcoming cataract surgery appointment scheduled in  November. During the call, BSW and patient contacted the [facility] in [place] to inquire about transportation. [facility] staff explained that they would provide transportation to her appointment but would not accompany her into the doctor's office. Patient was not pleased with this limitation, as she stated she requires the use of a wheelchair due to neuropathy in her legs. The [facility] of [place] provided a phone number for [facility] as an alternative option for transportation. Patient stated that she would contact [facility] [phone]) on her own time since she had another appointment to attend.  BSW will follow up with patient on September 22 at 2:00 PM to confirm whether she contacted [facility] to arrange transportation.     Recommendation:   No recommendations at this time  Follow Up Plan:   Telephone follow-up 03/28/24 at 2pm  Orlean Fey, BSW [place]  [facility] Social Worker, [facility] [phone]

## 2024-03-23 ENCOUNTER — Telehealth: Payer: Self-pay | Admitting: Pharmacy Technician

## 2024-03-23 ENCOUNTER — Other Ambulatory Visit (HOSPITAL_COMMUNITY): Payer: Self-pay

## 2024-03-23 NOTE — Telephone Encounter (Signed)
 Patient Advocate Encounter   The patient was approved for a [employer] grant that will help cover the cost of eliquis  Total amount awarded, 7500.  Effective: 02/22/24 - 02/20/25   APW:[id number] ERW:[id number] Group:[id number] PI:[id number] [employer] ID: [id number]   Pharmacy provided with approval and processing information. Patient informed via telephone

## 2024-03-25 ENCOUNTER — Ambulatory Visit (INDEPENDENT_AMBULATORY_CARE_PROVIDER_SITE_OTHER): Admitting: Family Medicine

## 2024-03-25 ENCOUNTER — Encounter: Payer: Self-pay | Admitting: Family Medicine

## 2024-03-25 VITALS — BP 124/62 | HR 80 | Temp 98.0°F | Resp 16 | Wt 112.8 lb

## 2024-03-25 DIAGNOSIS — E785 Hyperlipidemia, unspecified: Secondary | ICD-10-CM | POA: Diagnosis not present

## 2024-03-25 DIAGNOSIS — Z8673 Personal history of transient ischemic attack (TIA), and cerebral infarction without residual deficits: Secondary | ICD-10-CM

## 2024-03-25 DIAGNOSIS — J029 Acute pharyngitis, unspecified: Secondary | ICD-10-CM | POA: Diagnosis not present

## 2024-03-25 LAB — HEPATIC FUNCTION PANEL
ALT: 18 U/L (ref 0–35)
AST: 27 U/L (ref 0–37)
Albumin: 4 g/dL (ref 3.5–5.2)
Alkaline Phosphatase: 86 U/L (ref 39–117)
Bilirubin, Direct: 0.2 mg/dL (ref 0.0–0.3)
Total Bilirubin: 0.7 mg/dL (ref 0.2–1.2)
Total Protein: 7.2 g/dL (ref 6.0–8.3)

## 2024-03-25 LAB — LIPID PANEL
Cholesterol: 169 mg/dL (ref 0–200)
HDL: 73.7 mg/dL (ref >39.00)
LDL Cholesterol: 73 mg/dL (ref 0–99)
NonHDL: 95.7
Total CHOL/HDL Ratio: 2
Triglycerides: 113 mg/dL (ref 0.0–149.0)
VLDL: 22.6 mg/dL (ref 0.0–40.0)

## 2024-03-25 LAB — CBC WITH DIFFERENTIAL/PLATELET
Basophils Absolute: 0 K/uL (ref 0.0–0.1)
Basophils Relative: 0.5 % (ref 0.0–3.0)
Eosinophils Absolute: 0.3 K/uL (ref 0.0–0.7)
Eosinophils Relative: 4.7 % (ref 0.0–5.0)
HCT: 38 % (ref 36.0–46.0)
Hemoglobin: 12.2 g/dL (ref 12.0–15.0)
Lymphocytes Relative: 16.1 % (ref 12.0–46.0)
Lymphs Abs: 0.9 K/uL (ref 0.7–4.0)
MCHC: 32.1 g/dL (ref 30.0–36.0)
MCV: 92.3 fl (ref 78.0–100.0)
Monocytes Absolute: 0.6 K/uL (ref 0.1–1.0)
Monocytes Relative: 9.7 % (ref 3.0–12.0)
Neutro Abs: 3.9 K/uL (ref 1.4–7.7)
Neutrophils Relative %: 69 % (ref 43.0–77.0)
Platelets: 183 K/uL (ref 150.0–400.0)
RBC: 4.12 Mil/uL (ref 3.87–5.11)
RDW: 14.9 % (ref 11.5–15.5)
WBC: 5.7 K/uL (ref 4.0–10.5)

## 2024-03-25 LAB — POCT COVID BINAXNOW CARD: SARS Coronavirus 2 Ag: NEGATIVE

## 2024-03-25 LAB — BASIC METABOLIC PANEL WITH GFR
BUN: 25 mg/dL — ABNORMAL HIGH (ref 6–23)
CO2: 37 meq/L — ABNORMAL HIGH (ref 19–32)
Calcium: 9.5 mg/dL (ref 8.4–10.5)
Chloride: 99 meq/L (ref 96–112)
Creatinine, Ser: 0.99 mg/dL (ref 0.40–1.20)
GFR: 54.18 mL/min — ABNORMAL LOW (ref >60.00)
Glucose, Bld: 84 mg/dL (ref 70–99)
Potassium: 4.6 meq/L (ref 3.5–5.1)
Sodium: 141 meq/L (ref 135–145)

## 2024-03-25 LAB — TSH: TSH: 3.06 u[IU]/mL (ref 0.35–5.50)

## 2024-03-25 NOTE — Assessment & Plan Note (Signed)
 Pt has been resistant to starting meds in the past, but now that MRI shows hx of remote CVA, she may be interested in starting medication.  Check labs and start statin prn.

## 2024-03-25 NOTE — Assessment & Plan Note (Signed)
 New.  Reviewed MRI done in ER in July that shows remote CVA.  This is upsetting to her as she doesn't know when these occurred.  She is very anxious about this and wants to do what she can to prevent future events.  Neuro already restarted her on Eliquis .  Will check lipids and start statin if appropriate.  Pt expressed understanding and is in agreement w/ plan.

## 2024-03-25 NOTE — Patient Instructions (Signed)
 Follow up as needed or as scheduled We'll notify you of your lab results and make any changes if needed Thankfully you DO NOT have COVID Your sore throat is likely due to allergies/postnasal drip You can add daily Claritin or Zyrtec if needed for allergies/drainage Call with any questions or concerns Hang in there!

## 2024-03-25 NOTE — Progress Notes (Signed)
   Subjective:    Patient ID: [name], female    DOB: 02-Apr-1946, 78 y.o.   MRN: [id number]  HPI Sore throat- sxs started 1 week ago w/ L sided jaw pain 'from my neck up to my ear'.  Has hx of TMJ.  Had dental appt Monday and had fillings done.  After dental appt developed sore throat.  Is taking Tylenol  and gargling w/ salt water.  Yesterday sore throat seemed to be improving.  No fevers.    MRI- pt has MRIs scheduled for neck and back tomorrow.  Pt has had multiple MRI's of her head and the most recent one showed remote CVAs.  This was very upsetting to pt b/c she states she was unaware.  Had Neuro appt on 9/11 and she was restarted on Eliquis  2.5mg  BID.  She wants to know what else she can do to prevent future strokes.   Review of Systems For ROS see HPI     Objective:   Physical Exam Vitals reviewed.  Constitutional:      General: She is not in acute distress.    Appearance: Normal appearance. She is not ill-appearing.  HENT:     Head: Normocephalic and atraumatic.     Right Ear: Tympanic membrane and ear canal normal.     Left Ear: Tympanic membrane and ear canal normal.     Nose: Congestion present. No rhinorrhea.     Mouth/Throat:     Mouth: Mucous membranes are moist.     Pharynx: Posterior oropharyngeal erythema (copious PND) present. No oropharyngeal exudate.  Eyes:     Extraocular Movements: Extraocular movements intact.     Conjunctiva/sclera: Conjunctivae normal.  Pulmonary:     Effort: Pulmonary effort is normal. No respiratory distress.  Skin:    General: Skin is warm and dry.  Neurological:     Mental Status: She is alert and oriented to person, place, and time. Mental status is at baseline.  Psychiatric:     Comments: anxious           Assessment & Plan:  Sore throat- new.  COVID negative.  Currently her sore throat is her only sxs and reports this started improving yesterday.  No evidence of bacterial infxn on PE so no need for abx.  Copious drainage  present- which may be the cause of her discomfort.  Encouraged her to add daily allergy  medication.  Pt expressed understanding and is in agreement w/ plan.

## 2024-03-26 DIAGNOSIS — M542 Cervicalgia: Secondary | ICD-10-CM | POA: Diagnosis not present

## 2024-03-26 DIAGNOSIS — M549 Dorsalgia, unspecified: Secondary | ICD-10-CM | POA: Diagnosis not present

## 2024-03-27 NOTE — Progress Notes (Deleted)
 Cardiology Office Note:    Date:  03/27/2024   ID:  Regina, [name] January 16, 1946, MRN [id number]  PCP:  [name], MD  Cardiologist:  Redell Shallow, MD Electrophysiologist:  Soyla Gladis Norton, MD { Click to update primary MD,subspecialty MD or APP then REFRESH:1}    Referring MD: [name], MD   Chief Complaint: chest pain and shortness of breath   History of Present Illness:    [name] is a 78 y.o. 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 Shallow and presents today for evaluation of chest pain and shortness of breath.   Patient was previously followed by Dr. Blanca and now follows with Dr. Shallow for paroxysmal atrial fibrillation and mitral valve disease. She was diagnosed with atrial fibrillation in 2017. Monitor in 2020 showed occasional runs of SVT (longest episode 15 seconds) and rare PACs/ PVCs but no atrial fibrillation. She had progression of mitral valve regurgitation to the severe range in 2021 and ultimately underwent a minimally invasive mitral valve repair with MAZE procedure and clipping of left atrial appendage in 01/2020. Procedure was complicated by LV free wall laceration which required sternotomy with direct repair. Post-op course was complicated by recurrent atrial fibrillation requiring DCCV and Amiodarone  load. Cardiac catheterization prior to surgery showed mild non-obstructive CAD with only 20% stenosis of proximal  RCA and distal LAD. ABIs in 11/2022 for further evaluation of leg pain was normal. Last Echo in 01/2023 showed LVEF of 55-60% with mild LVH, moderately enlarged RV with moderately reduced function and an flattened interventricular septum in systole consistent with RV pressure overload, normal structure and function of mitral valve prosthesis, moderate TR, moderate PR, and moderately elevated PASP of 48.9 mmHg. She was noted to go back into atrial fibrillation in early 2025. She was loaded with Amiodarone  and then underwent DCCV in 10/2023.   From a non-cardiac standpoint,  she has problems with persistent dizziness and headaches lately and has been evaluated by ENT and Neurology. She has had multiple head CTs and brain MRIs. Recent brain MRI in 01/2024 showed chronic lacunar infarcts within the right caudate and right corona radiata and evidence of mild to moderate chronic ischemic small vessel disease but no acute ischemic changes. Repeat brain MRI at [facility] in 02/2024 showed interval development of tiny chronic infarcts in the right caudate and centrum semiovale. Neurology restarted Eliquis  following this which had previously been stopped due to recurrent nose bleeds. She also has severe anxiety (especially in regards to her health) and this has previously been felt to be a large contributor to some of her symptoms.   She has been seen in our office multiple times over the last several months. She was last seen by Katlyn West, NP, in 01/2024 at which time she continue to report shortness of breath which was stable at her baseline, chest pain that was felt to be atypical, and frequent headaches/ dizziness.   She saw Pulmonology in 02/2024. Per their note, she has a history of mild bronchiectasis but this is not felt to be the  etiology of her shortness of breath. PFTs normal. Dyspnea felt to be due to diastolic CHF. She also has moderate obstructive sleep apnea but has declined. Overnight pulse oximetry test was  ordered to see if she required nocturnal O2.   Patient called our office in early 03/2024 and reported ongoing chest pain and shortness of breath. Therefore, this visit was arranged for further evaluation.   Chest Pain Non-Obstructive CAD Patient has a history of atypical chest pain. LHC in 11/2019 showed mild non-obstructive CAD with only 20% stenosis of proximal RCA and distal LAD.  - *** - Not on Aspirin  given need for chronic anticoagulation with DOAC.  - She has previously declined statins.   Chronic HFpEF Patient has a history of chronic dyspnea and follows with Pulmonology. Per last Pulmonology note in 02/2024, she has a history of mild bronchiectasis but this is not felt to be the etiology of her shortness of breath. PFTs normal. Felt to be more due to CHF (deconditioning also likely playing a role). Last Echo in 01/2023 showed LVEF of 55-60% with mild LVH, moderately enlarged RV with moderately reduced function and an flattened interventricular septum in systole consistent with RV pressure overload, normal structure and function of mitral valve prosthesis, moderate TR, moderate PR, and moderately elevated PASP of 48.9 mmHg. - *** - Continue Lasix  60mg  three times weekly. *** - She has been hesitant to try new medications like a SGLT2 inhibitors in the past. *** - Repeat Echo ***  Paroxysmal Atrial Fibrillation S/p MAZE procedure and left atrial appendage clipping in 01/2020 at time of valve repair.  - Maintaining sinus rhythm. *** - Continue Lopressor  25mg  twice daily.  - Continue Amiodarone  200mg  daily.  - Continue chronic anticoagulation with Eliquis  2.5mg  twice daily. She does not technically meet criteria for reduced dosing (she will in 07/2024 when she turns 79) but has had issues with recurrent nose bleeds. She was actually taken off anticoagulation completely for a while due to this reason, but Eliquis  was restarted in in 03/2024 due to chronic infarcts noted on brain MRIs.   Severe  Mitral Regurgitation s/p MVR S/p minimally invasive valve repair in 01/2020. Procedure was complicated by LV free wall laceration which required sternotomy with direct repair. Most recent Echo in 01/2023 showed LVEF of 55-60% with normal structure and function of the mitral valve prothesis.  - Continue SBE prophylaxis prior to dental procedures.   Hypertension BP well controlled. *** - Continue Lopressor  25mg  twice daily.   Hyperlipidemia Lipid panel in 03/2024: Total Cholesterol 169, Triglycerides 113, HDL 73, LDL 73. LDL goal <70 given CAD and CVA.  - She has previously declined statins. ***  Obstructive Sleep Apnea - She has declined CPAP in the past.  - Followed by Pulmonology.   EKGs/Labs/Other Studies Reviewed:    The following studies were reviewed:  Monitor 05/2019: Max 226 bpm 06:22pm, 11/15 Min 54 bpm 12:59am, 11/15 Avg 76 bpm Sinus rhythm with occasional runs of SVT, longest 15 seconds at 123 bmp Rare PVCs, 2.2% PACs Zero atrial fibrillation noted Patient trigger associated with sinus rhythm, rate 87 _______________  Right/ Left Cardiac Catheterization 12/02/2019: Prox RCA lesion is 20% stenosed. Dist LAD lesion is 20% stenosed.   1. Mild non-obstructive CAD   Recommendation: Continue planning for mitral valve repair.  _______________  ABIs/ TBIs 11/17/2022: Summary:  Right: Resting right ankle-brachial index is within normal range. The  right toe-brachial index is normal.   Left: Resting left ankle-brachial index is within normal  range. The left  toe-brachial index is normal.  _______________  Echocardiogram 01/27/2023: Impressions: 1. Left ventricular ejection fraction, by estimation, is 55 to 60%. The  left ventricle has normal function. Left ventricular endocardial border  not optimally defined to evaluate regional wall motion. There is mild left  ventricular hypertrophy. Left  ventricular diastolic function could not be evaluated. There is the   interventricular septum is flattened in systole, consistent with right  ventricular pressure overload.   2. Right ventricular systolic function is moderately reduced. The right  ventricular size is moderately enlarged. There is moderately elevated  pulmonary artery systolic pressure. The estimated right ventricular  systolic pressure is 48.9 mmHg.   3. Right atrial size was moderately dilated.   4. The mitral valve has been repaired/replaced. Trivial mitral valve  regurgitation. The mean mitral valve gradient is 2.0 mmHg with average  heart rate of 63 bpm. There is a 32 mm Sorin Memo 4D prosthetic  annuloplasty ring present in the mitral position.   Procedure Date: 01/17/20. Echo findings are consistent with normal  structure and function of the mitral valve prosthesis.   5. Tricuspid valve regurgitation is moderate.   6. The aortic valve is tricuspid. There is mild calcification of the  aortic valve. There is mild thickening of the aortic valve. Aortic valve  regurgitation is mild. Aortic valve sclerosis is present, with no evidence  of aortic valve stenosis.   7. Pulmonic valve regurgitation is moderate.   8. The inferior vena cava is dilated in size with <50% respiratory  variability, suggesting right atrial pressure of 15 mmHg.    EKG:  EKG ordered today. EKG personally reviewed and demonstrates ***.  Recent Labs: 12/06/2023: Pro Brain Natriuretic Peptide 1,098.0 03/25/2024: ALT 18; BUN 25; Creatinine, Ser 0.99; Hemoglobin 12.2; Platelets 183.0; Potassium 4.6; Sodium 141; TSH 3.06  Recent Lipid Panel    Component Value Date/Time   CHOL 169 03/25/2024 1136   TRIG 113.0 03/25/2024 1136   HDL 73.70 03/25/2024 1136   CHOLHDL 2 03/25/2024 1136   VLDL 22.6 03/25/2024 1136   LDLCALC 73 03/25/2024 1136   LDLDIRECT 136.5 06/08/2013 1049    Physical Exam:    Vital Signs: There were no vitals taken for this visit.    Wt Readings from Last 3 Encounters:  03/25/24 112 lb 12.8 oz  (51.2 kg)  02/22/24 109 lb 4 oz (49.6 kg)  02/10/24 109 lb (49.4 kg)     General: 78 y.o. female in no acute distress. HEENT: Normocephalic and atraumatic. Sclera clear.  Neck: Supple. No carotid bruits. No JVD. Heart: *** RRR. Distinct S1 and S2. No murmurs, gallops, or rubs.  Lungs: No increased work of breathing. Clear to ausculation bilaterally. No wheezes, rhonchi, or rales.  Abdomen: Soft, non-distended, and non-tender to palpation.  Extremities: No lower extremity edema.  Radial and distal pedal pulses 2+ and equal bilaterally. Skin: Warm and dry. Neuro: No focal deficits. Psych: Normal affect. Responds appropriately.   Assessment:    No diagnosis found.  Plan:     Disposition: Follow up in ***   Signed, Aline FORBES Door, PA-C  03/27/2024 4:13 PM    [facility]

## 2024-03-28 ENCOUNTER — Other Ambulatory Visit: Payer: Self-pay

## 2024-03-28 ENCOUNTER — Ambulatory Visit: Payer: Self-pay | Admitting: Family Medicine

## 2024-03-28 NOTE — Progress Notes (Signed)
 Patient reviewed message and labs

## 2024-03-28 NOTE — Patient Outreach (Signed)
 Complex Care Management   Visit Note  03/28/2024  Name:  [name] MRN: [id number] DOB: 1945-09-26  Situation: Referral received for Complex Care Management related to SDOH Barriers:  Transportation I obtained verbal consent from Patient.  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   Mitral regurgitation    Osteopenia 02/18/2012   Osteoporosis    PAF (paroxysmal atrial fibrillation) (HCC) 2012   Peripheral neuropathy 11/06/2011   PMDD (premenstrual dysphoric disorder)    PONV (postoperative nausea and vomiting)    nausea, vomiting, hives and dizziness    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/2020   Complete bilateral atrial lesion set using cryothermy and bipolar radiofrequency ablation with clipping of LA appendage via right mini-thoracotomy approach   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 Patient stated that she was able to contact [facility] and spoke with a representative regarding transportation specifically for her cataract surgery. Patient reported that she successfully scheduled an  appointment for transportation and plans to continue using [facility] [employer] for future transportation needs. BSW informed patient that she may contact her at any time if additional assistance is needed. BSW will close the case at this time.   Recommendation:   No recommendations at this time.  Follow Up Plan:   Patient has met all care management goals. Care Management case will be closed. Patient has been provided contact information should new needs arise.   Orlean Fey, BSW [place]  [facility] Social Worker, [facility] [phone]

## 2024-03-28 NOTE — Patient Instructions (Signed)
 Visit Information  Thank you for taking time to visit with me today. Please don't hesitate to contact me if I can be of assistance to you before our next scheduled appointment.  Your next care management appointment is no further scheduled appointments.   Patient has met all care management goals. Care Management case will be closed. Patient has been provided contact information should new needs arise.   Please call the care guide team at [phone] if you need to cancel, schedule, or reschedule an appointment.   Please call the Suicide and Crisis Lifeline: 988 call the [place]  National Suicide Prevention Lifeline: 506-590-3544 or TTY: (513)189-3137 TTY 864-347-0310) to talk to a trained counselor call 1-800-273-TALK (toll free, 24 hour hotline) call 911 if you are experiencing a Mental Health or Behavioral Health Crisis or need someone to talk to.  Orlean Fey, BSW [place]  [facility] Social Worker, [facility] [phone]

## 2024-04-02 NOTE — Progress Notes (Addendum)
 Cardiology Office Note:    Date:  04/19/2024   ID:  [name], DOB 06-04-1946, MRN [id number]  PCP:  Regina Comer BRAVO, MD  Cardiologist:  Regina Shallow, MD Electrophysiologist:  Regina Gladis Norton, MD     Referring MD: Regina Comer BRAVO, MD   Chief Complaint: chest pain and shortness of breath   History of Present Illness:    [name] is a 78 y.o. female with a history of non-obstructive CAD on cardiac catheterization in 11/2019, chronic HFpEF with chronic dyspnea, paroxysmal atrial fibrillation s/p MAZE procedure and left atrial appendage clipping in 01/2020 (no longer on anticoagulation due to recurrent nose bleeds), severe mitral regurgitation s/p minimally invasive mitral valve repair in 01/2020, bronchiectasis, CVA noted incidentally on brain MRI in 01/2024, hypertension, hyperlipidemia, obstructive sleep apnea, vertigo, chronic back pain, severe anxiety, and multiple medication intolerances who is followed by [name] and presents today for evaluation of chest pain and shortness of breath.   Patient was previously followed by Dr. Blanca and now follows with [name] for paroxysmal atrial fibrillation and mitral valve disease. She was diagnosed with atrial fibrillation in 2017. Monitor in 2020 showed occasional runs of SVT (longest episode 15 seconds) and rare PACs/ PVCs but no atrial fibrillation. She had progression of mitral valve regurgitation to the severe range in 2021 and ultimately underwent a minimally invasive mitral valve repair with MAZE procedure and clipping of left atrial appendage in 01/2020. Procedure was complicated by LV free wall laceration which required sternotomy with direct repair. Post-op course was complicated by recurrent atrial fibrillation requiring DCCV and Amiodarone  load. Cardiac catheterization prior to surgery showed mild non-obstructive CAD with only 20% stenosis of proximal RCA and distal LAD. ABIs in 11/2022 for further evaluation of leg  pain was normal. Last Echo in 01/2023 showed LVEF of 55-60% with mild LVH, moderately enlarged RV with moderately reduced function and an flattened interventricular septum in systole consistent with RV pressure overload, normal structure and function of mitral valve prosthesis, moderate TR, moderate PR, and moderately elevated PASP of 48.9 mmHg. She was noted to go back into atrial fibrillation in early 2025. She was loaded with Amiodarone  and then underwent DCCV in 10/2023.   From a non-cardiac standpoint,  she has problems with persistent dizziness and headaches lately and has been evaluated by ENT and Neurology. She has had multiple head CTs and brain MRIs. Recent brain MRI in 01/2024 showed chronic lacunar infarcts within the right caudate and right corona radiata and evidence of mild to moderate chronic ischemic small vessel disease but no acute ischemic changes. Repeat brain [facility] at [facility] in 02/2024 showed interval development of tiny chronic infarcts in the right caudate and centrum semiovale. Neurology restarted Eliquis  following this which had previously been stopped due to recurrent nose bleeds. She also has severe anxiety (especially in regards to her health) and this has previously been felt to be a large contributor to some of her symptoms.   She has been seen in our office multiple times over the last several months. She was last seen by Regina West, NP, in 01/2024 at which time she continue to report shortness of breath which was stable at her baseline, chest pain that was felt to be atypical, and frequent headaches/ dizziness.   She saw Pulmonology in 02/2024. Per their note, she has a history of mild bronchiectasis but this is not felt to be the etiology of her shortness of breath. PFTs normal. Dyspnea felt  to be due to diastolic CHF. She also has moderate obstructive sleep apnea but has declined. Overnight pulse oximetry test was ordered to see if she required nocturnal O2.   Patient called our  office in early 03/2024 and reported ongoing chest pain and shortness of breath. Therefore, this visit was arranged for further evaluation.  She reports intermittent vague chest discomfort that she describes as an uncomfortable feeling in her chest, typically last a few seconds up to 1 to 2 minutes and improves if she applies heat.  She also reports occasional sharp chest pain that lasts 1 to 2 seconds and then resolves.  However, nothing that sounds like cardiac chest pain.  She continues to have significant shortness of breath with minimal exertion.  She states she has to stop frequently we will go through the greater curvature of breath and even get short of breath when walking in her house.  It sounds like she may have had some slight gradual worsening of her shortness of breath over the last few months.  No orthopnea or PND.  She follows with Pulmonology and states they are trying to arrange a sleep study.  She does describe recent lower extremity edema.  She currently alternates between Lasix  40 mg and 60 mg every other day and will sometimes take an extra 20 mg on the 40 mg days but states that her edema does not ever completely go away.  She denies any significant palpitations.  She is still has dizziness and nausea but does feel like the dizziness has improved some.  No syncope.   We spent a large time of today's visit talking about recent prior strokes noted on head imaging and need for anticoagulation.  She states she is still under quite a bit of stress lately but she is working on getting established with a therapist.  She states she just feels a very weary in regards to dealing with all her health issues.  She denies any thoughts of harming herself.  EKGs/Labs/Other Studies Reviewed:    The following studies were reviewed:  Monitor 05/2019: Max 226 bpm 06:22pm, 11/15 Min 54 bpm 12:59am, 11/15 Avg 76 bpm Sinus rhythm with occasional runs of SVT, longest 15 seconds at 123 bmp Rare PVCs,  2.2% PACs Zero atrial fibrillation noted Patient trigger associated with sinus rhythm, rate 87 _______________  Right/ Left Cardiac Catheterization 12/02/2019: Prox RCA lesion is 20% stenosed. Dist LAD lesion is 20% stenosed.   1. Mild non-obstructive CAD   Recommendation: Continue planning for mitral valve repair.  _______________  ABIs/ TBIs 11/17/2022: Summary:  Right: Resting right ankle-brachial index is within normal range. The  right toe-brachial index is normal.   Left: Resting left ankle-brachial index is within normal range. The left  toe-brachial index is normal.  _______________  Echocardiogram 01/27/2023: Impressions: 1. Left ventricular ejection fraction, by estimation, is 55 to 60%. The  left ventricle has normal function. Left ventricular endocardial border  not optimally defined to evaluate regional wall motion. There is mild left  ventricular hypertrophy. Left  ventricular diastolic function could not be evaluated. There is the  interventricular septum is flattened in systole, consistent with right  ventricular pressure overload.   2. Right ventricular systolic function is moderately reduced. The right  ventricular size is moderately enlarged. There is moderately elevated  pulmonary artery systolic pressure. The estimated right ventricular  systolic pressure is 48.9 mmHg.   3. Right atrial size was moderately dilated.   4. The mitral valve has been  repaired/replaced. Trivial mitral valve  regurgitation. The mean mitral valve gradient is 2.0 mmHg with average  heart rate of 63 bpm. There is a 32 mm Sorin Memo 4D prosthetic  annuloplasty ring present in the mitral position.   Procedure Date: 01/17/20. Echo findings are consistent with normal  structure and function of the mitral valve prosthesis.   5. Tricuspid valve regurgitation is moderate.   6. The aortic valve is tricuspid. There is mild calcification of the  aortic valve. There is mild thickening of the  aortic valve. Aortic valve  regurgitation is mild. Aortic valve sclerosis is present, with no evidence  of aortic valve stenosis.   7. Pulmonic valve regurgitation is moderate.   8. The inferior vena cava is dilated in size with <50% respiratory  variability, suggesting right atrial pressure of 15 mmHg.    EKG:  EKG ordered today.   EKG Interpretation Date/Time:  Friday April 08 2024 15:24:29 EDT Ventricular Rate:  50 PR Interval:  198 QRS Duration:  74 QT Interval:  514 QTC Calculation: 468 R Axis:   97  Text Interpretation: Sinus bradycardia Slight ST depression in lead V6 Abnormal T waves in leads I and aVL Confirmed by [name] Patient [phone]) on 04/08/2024 9:53:04 PM    Recent Labs: 03/25/2024: ALT 18; BUN 25; Creatinine, Ser 0.99; Hemoglobin 12.2; Platelets 183.0; Potassium 4.6; Sodium 141; TSH 3.06 04/11/2024: NT-Pro BNP 2,945  Recent Lipid Panel    Component Value Date/Time   CHOL 169 03/25/2024 1136   TRIG 113.0 03/25/2024 1136   HDL 73.70 03/25/2024 1136   CHOLHDL 2 03/25/2024 1136   VLDL 22.6 03/25/2024 1136   LDLCALC 73 03/25/2024 1136   LDLDIRECT 136.5 06/08/2013 1049    Physical Exam:    Vital Signs: BP (!) 110/56 (BP Location: Left Arm, Patient Position: Sitting, Cuff Size: Normal)   Pulse (!) 57   Ht 5' 6 (1.676 m)   Wt 109 lb (49.4 kg)   SpO2 94%   BMI 17.59 kg/m     Wt Readings from Last 3 Encounters:  04/08/24 109 lb (49.4 kg)  03/25/24 112 lb 12.8 oz (51.2 kg)  02/22/24 109 lb 4 oz (49.6 kg)     General: 78 y.o. thin Caucasian female in no acute distress. HEENT: Normocephalic and atraumatic. Sclera clear.  Neck: Supple. No JVD. Heart: RRR. I-II/VI systolic murmur at the apex.  Lungs: No increased work of breathing. Clear to ausculation bilaterally. No wheezes, rhonchi, or rales.  Abdomen: Soft, non-distended, and non-tender to palpation.  Extremities: Mild mostly non-pitting lower extremity edema.  Skin: Warm and dry. Neuro: No focal  deficits. Psych: Normal affect. Responds appropriately.   Assessment:    1. Atypical chest pain   2. Coronary artery disease involving native coronary artery of native heart without angina pectoris   3. Chronic dyspnea   4. Chronic diastolic heart failure (HCC)   5. Paroxysmal atrial fibrillation (HCC)   6. Severe mitral regurgitation   7. S/P MVR (mitral valve repair)   8. Primary hypertension   9. Hyperlipidemia, unspecified hyperlipidemia type   10. Obstructive sleep apnea   11. Chronic heart failure with preserved ejection fraction (HFpEF) (HCC)     Plan:    Atypical Chest Pain Non-Obstructive CAD Patient has a history of atypical chest pain. LHC in 11/2019 showed mild non-obstructive CAD with only 20% stenosis of proximal RCA and distal LAD.  - She describes very atypical vague chest pain that does not sound cardiac. -  Not on Aspirin  given need for chronic anticoagulation with DOAC.  - She has previously declined statins.  - No additional ischemic work-up necessary at this time.   Chronic Dyspnea  Chronic HFpEF Patient has a history of chronic dyspnea and follows with Pulmonology. Per last Pulmonology note in 02/2024, she has a history of mild bronchiectasis but this is not felt to be the etiology of her shortness of breath. PFTs normal. Felt to be more due to CHF (deconditioning also likely playing a role). Last Echo in 01/2023 showed LVEF of 55-60% with mild LVH, moderately enlarged RV with moderately reduced function and an flattened interventricular septum in systole consistent with RV pressure overload, normal structure and function of mitral valve prosthesis, moderate TR, moderate PR, and moderately elevated PASP of 48.9 mmHg. - She has chronic dyspnea but feels like this has gradually progressed recently. She also reports worsening lower extremity edema recently. She does have mild edema on exam but otherwise does not appear significantly volume overloaded.  - Continue  alternating Lasix  60mg  and 40mg  every other day.  Can continue to take an extra 20mg  on the 40mg  days as needed for worsening swelling.  - She has been hesitant to try new medications like a SGLT2 inhibitors in the past.  - Recommend compression stockings to help with lower extremity edema.  - Will check Pro-BNP today and repeat Echo. Depending on what Pro-BNP shows, may consider switching to Torsemide or adding Spironolactone.  - Question whether she would benefit from a RHC but will wait on Echo results.   Paroxysmal Atrial Fibrillation S/p MAZE procedure and left atrial appendage clipping in 01/2020 at time of valve repair.  - Maintaining sinus rhythm.  - Continue Lopressor  25mg  twice daily.  - Continue Amiodarone  200mg  daily. TSH and LFTs were normal in 03/2024.  - Continue chronic anticoagulation with Eliquis  2.5mg  twice daily. She does not technically meet criteria for reduced dosing quite yet (she will in soon in 07/2024 when she turns 79) but has had issues with recurrent nose bleeds. She was actually taken off anticoagulation completely for a while due to this reason, but Eliquis  was restarted in in 03/2024 due to chronic infarcts noted on brain MRIs. Continue current dose.  - Offered heart monitor to assess for paroxysmal atrial fibrillation given chronic infarcts noted on recent MRI but patient declined.  Of note, she states she typically gets bad nose bleeds in the summer. Recommend using nasal saline as the weather gets colder so nose does not get try. If she has recurrent severe nose bleeds, would recommend following up with ENT for consideration of cauterization.   Severe Mitral Regurgitation s/p MVR S/p minimally invasive valve repair in 01/2020. Procedure was complicated by LV free wall laceration which required sternotomy with direct repair. Most recent Echo in 01/2023 showed LVEF of 55-60% with normal structure and function of the mitral valve prothesis.  - Continue SBE prophylaxis  prior to dental procedures.  - Can reassess on repeat Echo.   Hypertension BP well controlled.  - Continue Lopressor  25mg  twice daily.   Hyperlipidemia Lipid panel in 03/2024: Total Cholesterol 169, Triglycerides 113, HDL 73, LDL 73. LDL goal <70 given CAD and CVA.  - She has previously declined statins.   Dizziness She has persisteint dizziness which has been intermittent for a couple of years. She has been worked up by Neurology and ENT.  - She continues to have dizziness but thinks it has improved some.  - Not felt to  be cardiac. No additional cardiac work-up necessary at this time.  -  Obstructive Sleep Apnea - She has declined CPAP in the past.  - Followed by Pulmonology.   Disposition: Patient already has follow-up scheduled in 05/2024 with Regina Weest, NP.    Signed, Regina FORBES Door, PA-C  04/19/2024 7:00 PM    [facility]  ADDENDUM 04/13/2024: Pro-BNP elevated at 2,945 (previously 1,121 in 06/2022). She currently takes 60mg  of Lasix  on Tuesdays, Thursdays, and Saturdays and then 40mg  every other day (correction to above). We discussed different treatment options including increasing Lasix , starting Spironolactone or SGLT2 inhibitor, and even potentially switching to Torsemide. She has a lot of medication intolerance so would prefer to just trying increasing Lasix  first. Therefore, will increase Lasix  to 60mg  every day. Will increase KCl to 20 mEq every day as well (previously only taking when taking 60mg  of Lasix ). Will then recheck BMET and pro BNP in 1 week.   Regina Satz E Demeisha Geraghty, PA-C 04/19/2024 7:00 PM  ADDENDUM 04/19/2024: Echo showed LVEF of 55-60% and moderately enlarged RV with moderately reduced RV function with a flattened interventricular septum in systole and diastole consistent with RV pressure and volume overload and a severely elevated RVSP of 75.5 mmHg. It also showed severe tricuspid regurgitation and severe pulmonic regurgitation. There was normal  structure and function of the mitral valve prothesis with no evidence of mitral stenosis and only trivial mitral regurgitation. Discussed with Dr. Pietro who recommended referral to the [facility] team. She is scheduled to see Dr. Cherrie next month. I called and reviewed case with Dr. Cherrie - will go ahead and set up right/ left cardiac catheterization with him next week. Called and discussed this with patient who is agreeable. Will need to work out exact timing of procedure with the cath lab staff. Patient is on Eliquis  - can hold this for 1 day per Dr. Bensimhon. She has a contrast allergy  so will need to be pre-medicated.  Shared Decision Making/Informed Consent{ The risks [stroke (1 in 1000), death (1 in 1000), kidney failure [usually temporary] (1 in 500), bleeding (1 in 200), allergic reaction [possibly serious] (1 in 200)], benefits (diagnostic support and management of coronary artery disease) and alternatives of a cardiac catheterization were discussed in detail with Ms. Rabadi and she is willing to proceed.    Jaylinn Hellenbrand E Tandy Lewin, PA-C 04/19/2024 7:00 PM

## 2024-04-02 NOTE — H&P (View-Only) (Signed)
 Cardiology Office Note:    Date:  04/19/2024   ID:  [name], DOB 06-04-1946, MRN [id number]  PCP:  [name], MD  Cardiologist:  Redell Shallow, MD Electrophysiologist:  Soyla Gladis Norton, MD     Referring MD: [name], MD   Chief Complaint: chest pain and shortness of breath   History of Present Illness:    [name] is a 78 y.o. 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 Shallow and presents today for evaluation of chest pain and shortness of breath.   Patient was previously followed by Dr. Blanca and now follows with Dr. Shallow for paroxysmal atrial fibrillation and mitral valve disease. She was diagnosed with atrial fibrillation in 2017. Monitor in 2020 showed occasional runs of SVT (longest episode 15 seconds) and rare PACs/ PVCs but no atrial fibrillation. She had progression of mitral valve regurgitation to the severe range in 2021 and ultimately underwent a minimally invasive mitral valve repair with MAZE procedure and clipping of left atrial appendage in 01/2020. Procedure was complicated by LV free wall laceration which required sternotomy with direct repair. Post-op course was complicated by recurrent atrial fibrillation requiring DCCV and Amiodarone  load. Cardiac catheterization prior to surgery showed mild non-obstructive CAD with only 20% stenosis of proximal RCA and distal LAD. ABIs in 11/2022 for further evaluation of leg  pain was normal. Last Echo in 01/2023 showed LVEF of 55-60% with mild LVH, moderately enlarged RV with moderately reduced function and an flattened interventricular septum in systole consistent with RV pressure overload, normal structure and function of mitral valve prosthesis, moderate TR, moderate PR, and moderately elevated PASP of 48.9 mmHg. She was noted to go back into atrial fibrillation in early 2025. She was loaded with Amiodarone  and then underwent DCCV in 10/2023.   From a non-cardiac standpoint,  she has problems with persistent dizziness and headaches lately and has been evaluated by ENT and Neurology. She has had multiple head CTs and brain MRIs. Recent brain MRI in 01/2024 showed chronic lacunar infarcts within the right caudate and right corona radiata and evidence of mild to moderate chronic ischemic small vessel disease but no acute ischemic changes. Repeat brain MRI at [facility] in 02/2024 showed interval development of tiny chronic infarcts in the right caudate and centrum semiovale. Neurology restarted Eliquis  following this which had previously been stopped due to recurrent nose bleeds. She also has severe anxiety (especially in regards to her health) and this has previously been felt to be a large contributor to some of her symptoms.   She has been seen in our office multiple times over the last several months. She was last seen by Katlyn West, NP, in 01/2024 at which time she continue to report shortness of breath which was stable at her baseline, chest pain that was felt to be atypical, and frequent headaches/ dizziness.   She saw Pulmonology in 02/2024. Per their note, she has a history of mild bronchiectasis but this is not felt to be the etiology of her shortness of breath. PFTs normal. Dyspnea felt  to be due to diastolic CHF. She also has moderate obstructive sleep apnea but has declined. Overnight pulse oximetry test was ordered to see if she required nocturnal O2.   Patient called our  office in early 03/2024 and reported ongoing chest pain and shortness of breath. Therefore, this visit was arranged for further evaluation.  She reports intermittent vague chest discomfort that she describes as an uncomfortable feeling in her chest, typically last a few seconds up to 1 to 2 minutes and improves if she applies heat.  She also reports occasional sharp chest pain that lasts 1 to 2 seconds and then resolves.  However, nothing that sounds like cardiac chest pain.  She continues to have significant shortness of breath with minimal exertion.  She states she has to stop frequently we will go through the greater curvature of breath and even get short of breath when walking in her house.  It sounds like she may have had some slight gradual worsening of her shortness of breath over the last few months.  No orthopnea or PND.  She follows with Pulmonology and states they are trying to arrange a sleep study.  She does describe recent lower extremity edema.  She currently alternates between Lasix  40 mg and 60 mg every other day and will sometimes take an extra 20 mg on the 40 mg days but states that her edema does not ever completely go away.  She denies any significant palpitations.  She is still has dizziness and nausea but does feel like the dizziness has improved some.  No syncope.   We spent a large time of today's visit talking about recent prior strokes noted on head imaging and need for anticoagulation.  She states she is still under quite a bit of stress lately but she is working on getting established with a therapist.  She states she just feels a very weary in regards to dealing with all her health issues.  She denies any thoughts of harming herself.  EKGs/Labs/Other Studies Reviewed:    The following studies were reviewed:  Monitor 05/2019: Max 226 bpm 06:22pm, 11/15 Min 54 bpm 12:59am, 11/15 Avg 76 bpm Sinus rhythm with occasional runs of SVT, longest 15 seconds at 123 bmp Rare PVCs,  2.2% PACs Zero atrial fibrillation noted Patient trigger associated with sinus rhythm, rate 87 _______________  Right/ Left Cardiac Catheterization 12/02/2019: Prox RCA lesion is 20% stenosed. Dist LAD lesion is 20% stenosed.   1. Mild non-obstructive CAD   Recommendation: Continue planning for mitral valve repair.  _______________  ABIs/ TBIs 11/17/2022: Summary:  Right: Resting right ankle-brachial index is within normal range. The  right toe-brachial index is normal.   Left: Resting left ankle-brachial index is within normal range. The left  toe-brachial index is normal.  _______________  Echocardiogram 01/27/2023: Impressions: 1. Left ventricular ejection fraction, by estimation, is 55 to 60%. The  left ventricle has normal function. Left ventricular endocardial border  not optimally defined to evaluate regional wall motion. There is mild left  ventricular hypertrophy. Left  ventricular diastolic function could not be evaluated. There is the  interventricular septum is flattened in systole, consistent with right  ventricular pressure overload.   2. Right ventricular systolic function is moderately reduced. The right  ventricular size is moderately enlarged. There is moderately elevated  pulmonary artery systolic pressure. The estimated right ventricular  systolic pressure is 48.9 mmHg.   3. Right atrial size was moderately dilated.   4. The mitral valve has been  repaired/replaced. Trivial mitral valve  regurgitation. The mean mitral valve gradient is 2.0 mmHg with average  heart rate of 63 bpm. There is a 32 mm Sorin Memo 4D prosthetic  annuloplasty ring present in the mitral position.   Procedure Date: 01/17/20. Echo findings are consistent with normal  structure and function of the mitral valve prosthesis.   5. Tricuspid valve regurgitation is moderate.   6. The aortic valve is tricuspid. There is mild calcification of the  aortic valve. There is mild thickening of the  aortic valve. Aortic valve  regurgitation is mild. Aortic valve sclerosis is present, with no evidence  of aortic valve stenosis.   7. Pulmonic valve regurgitation is moderate.   8. The inferior vena cava is dilated in size with <50% respiratory  variability, suggesting right atrial pressure of 15 mmHg.    EKG:  EKG ordered today.   EKG Interpretation Date/Time:  Friday April 08 2024 15:24:29 EDT Ventricular Rate:  50 PR Interval:  198 QRS Duration:  74 QT Interval:  514 QTC Calculation: 468 R Axis:   97  Text Interpretation: Sinus bradycardia Slight ST depression in lead V6 Abnormal T waves in leads I and aVL Confirmed by [name] Patient [phone]) on 04/08/2024 9:53:04 PM    Recent Labs: 03/25/2024: ALT 18; BUN 25; Creatinine, Ser 0.99; Hemoglobin 12.2; Platelets 183.0; Potassium 4.6; Sodium 141; TSH 3.06 04/11/2024: NT-Pro BNP 2,945  Recent Lipid Panel    Component Value Date/Time   CHOL 169 03/25/2024 1136   TRIG 113.0 03/25/2024 1136   HDL 73.70 03/25/2024 1136   CHOLHDL 2 03/25/2024 1136   VLDL 22.6 03/25/2024 1136   LDLCALC 73 03/25/2024 1136   LDLDIRECT 136.5 06/08/2013 1049    Physical Exam:    Vital Signs: BP (!) 110/56 (BP Location: Left Arm, Patient Position: Sitting, Cuff Size: Normal)   Pulse (!) 57   Ht 5' 6 (1.676 m)   Wt 109 lb (49.4 kg)   SpO2 94%   BMI 17.59 kg/m     Wt Readings from Last 3 Encounters:  04/08/24 109 lb (49.4 kg)  03/25/24 112 lb 12.8 oz (51.2 kg)  02/22/24 109 lb 4 oz (49.6 kg)     General: 78 y.o. thin Caucasian female in no acute distress. HEENT: Normocephalic and atraumatic. Sclera clear.  Neck: Supple. No JVD. Heart: RRR. I-II/VI systolic murmur at the apex.  Lungs: No increased work of breathing. Clear to ausculation bilaterally. No wheezes, rhonchi, or rales.  Abdomen: Soft, non-distended, and non-tender to palpation.  Extremities: Mild mostly non-pitting lower extremity edema.  Skin: Warm and dry. Neuro: No focal  deficits. Psych: Normal affect. Responds appropriately.   Assessment:    1. Atypical chest pain   2. Coronary artery disease involving native coronary artery of native heart without angina pectoris   3. Chronic dyspnea   4. Chronic diastolic heart failure (HCC)   5. Paroxysmal atrial fibrillation (HCC)   6. Severe mitral regurgitation   7. S/P MVR (mitral valve repair)   8. Primary hypertension   9. Hyperlipidemia, unspecified hyperlipidemia type   10. Obstructive sleep apnea   11. Chronic heart failure with preserved ejection fraction (HFpEF) (HCC)     Plan:    Atypical Chest Pain Non-Obstructive CAD Patient has a history of atypical chest pain. LHC in 11/2019 showed mild non-obstructive CAD with only 20% stenosis of proximal RCA and distal LAD.  - She describes very atypical vague chest pain that does not sound cardiac. -  Not on Aspirin  given need for chronic anticoagulation with DOAC.  - She has previously declined statins.  - No additional ischemic work-up necessary at this time.   Chronic Dyspnea  Chronic HFpEF Patient has a history of chronic dyspnea and follows with Pulmonology. Per last Pulmonology note in 02/2024, she has a history of mild bronchiectasis but this is not felt to be the etiology of her shortness of breath. PFTs normal. Felt to be more due to CHF (deconditioning also likely playing a role). Last Echo in 01/2023 showed LVEF of 55-60% with mild LVH, moderately enlarged RV with moderately reduced function and an flattened interventricular septum in systole consistent with RV pressure overload, normal structure and function of mitral valve prosthesis, moderate TR, moderate PR, and moderately elevated PASP of 48.9 mmHg. - She has chronic dyspnea but feels like this has gradually progressed recently. She also reports worsening lower extremity edema recently. She does have mild edema on exam but otherwise does not appear significantly volume overloaded.  - Continue  alternating Lasix  60mg  and 40mg  every other day.  Can continue to take an extra 20mg  on the 40mg  days as needed for worsening swelling.  - She has been hesitant to try new medications like a SGLT2 inhibitors in the past.  - Recommend compression stockings to help with lower extremity edema.  - Will check Pro-BNP today and repeat Echo. Depending on what Pro-BNP shows, may consider switching to Torsemide or adding Spironolactone.  - Question whether she would benefit from a RHC but will wait on Echo results.   Paroxysmal Atrial Fibrillation S/p MAZE procedure and left atrial appendage clipping in 01/2020 at time of valve repair.  - Maintaining sinus rhythm.  - Continue Lopressor  25mg  twice daily.  - Continue Amiodarone  200mg  daily. TSH and LFTs were normal in 03/2024.  - Continue chronic anticoagulation with Eliquis  2.5mg  twice daily. She does not technically meet criteria for reduced dosing quite yet (she will in soon in 07/2024 when she turns 79) but has had issues with recurrent nose bleeds. She was actually taken off anticoagulation completely for a while due to this reason, but Eliquis  was restarted in in 03/2024 due to chronic infarcts noted on brain MRIs. Continue current dose.  - Offered heart monitor to assess for paroxysmal atrial fibrillation given chronic infarcts noted on recent MRI but patient declined.  Of note, she states she typically gets bad nose bleeds in the summer. Recommend using nasal saline as the weather gets colder so nose does not get try. If she has recurrent severe nose bleeds, would recommend following up with ENT for consideration of cauterization.   Severe Mitral Regurgitation s/p MVR S/p minimally invasive valve repair in 01/2020. Procedure was complicated by LV free wall laceration which required sternotomy with direct repair. Most recent Echo in 01/2023 showed LVEF of 55-60% with normal structure and function of the mitral valve prothesis.  - Continue SBE prophylaxis  prior to dental procedures.  - Can reassess on repeat Echo.   Hypertension BP well controlled.  - Continue Lopressor  25mg  twice daily.   Hyperlipidemia Lipid panel in 03/2024: Total Cholesterol 169, Triglycerides 113, HDL 73, LDL 73. LDL goal <70 given CAD and CVA.  - She has previously declined statins.   Dizziness She has persisteint dizziness which has been intermittent for a couple of years. She has been worked up by Neurology and ENT.  - She continues to have dizziness but thinks it has improved some.  - Not felt to  be cardiac. No additional cardiac work-up necessary at this time.  -  Obstructive Sleep Apnea - She has declined CPAP in the past.  - Followed by Pulmonology.   Disposition: Patient already has follow-up scheduled in 05/2024 with Katlyn Weest, NP.    Signed, Aline FORBES Door, PA-C  04/19/2024 7:00 PM    [facility]  ADDENDUM 04/13/2024: Pro-BNP elevated at 2,945 (previously 1,121 in 06/2022). She currently takes 60mg  of Lasix  on Tuesdays, Thursdays, and Saturdays and then 40mg  every other day (correction to above). We discussed different treatment options including increasing Lasix , starting Spironolactone or SGLT2 inhibitor, and even potentially switching to Torsemide. She has a lot of medication intolerance so would prefer to just trying increasing Lasix  first. Therefore, will increase Lasix  to 60mg  every day. Will increase KCl to 20 mEq every day as well (previously only taking when taking 60mg  of Lasix ). Will then recheck BMET and pro BNP in 1 week.   Makoa Satz E Demeisha Geraghty, PA-C 04/19/2024 7:00 PM  ADDENDUM 04/19/2024: Echo showed LVEF of 55-60% and moderately enlarged RV with moderately reduced RV function with a flattened interventricular septum in systole and diastole consistent with RV pressure and volume overload and a severely elevated RVSP of 75.5 mmHg. It also showed severe tricuspid regurgitation and severe pulmonic regurgitation. There was normal  structure and function of the mitral valve prothesis with no evidence of mitral stenosis and only trivial mitral regurgitation. Discussed with Dr. Pietro who recommended referral to the [facility] team. She is scheduled to see Dr. Cherrie next month. I called and reviewed case with Dr. Cherrie - will go ahead and set up right/ left cardiac catheterization with him next week. Called and discussed this with patient who is agreeable. Will need to work out exact timing of procedure with the cath lab staff. Patient is on Eliquis  - can hold this for 1 day per Dr. Bensimhon. She has a contrast allergy  so will need to be pre-medicated.  Shared Decision Making/Informed Consent{ The risks [stroke (1 in 1000), death (1 in 1000), kidney failure [usually temporary] (1 in 500), bleeding (1 in 200), allergic reaction [possibly serious] (1 in 200)], benefits (diagnostic support and management of coronary artery disease) and alternatives of a cardiac catheterization were discussed in detail with [name] and she is willing to proceed.    Jaylinn Hellenbrand E Tandy Lewin, PA-C 04/19/2024 7:00 PM

## 2024-04-04 ENCOUNTER — Other Ambulatory Visit: Payer: Self-pay

## 2024-04-04 DIAGNOSIS — G4736 Sleep related hypoventilation in conditions classified elsewhere: Secondary | ICD-10-CM | POA: Diagnosis not present

## 2024-04-05 ENCOUNTER — Encounter: Payer: Self-pay | Admitting: Pulmonary Disease

## 2024-04-05 DIAGNOSIS — Z85820 Personal history of malignant melanoma of skin: Secondary | ICD-10-CM | POA: Diagnosis not present

## 2024-04-05 DIAGNOSIS — D1801 Hemangioma of skin and subcutaneous tissue: Secondary | ICD-10-CM | POA: Diagnosis not present

## 2024-04-05 DIAGNOSIS — B079 Viral wart, unspecified: Secondary | ICD-10-CM | POA: Diagnosis not present

## 2024-04-05 DIAGNOSIS — D485 Neoplasm of uncertain behavior of skin: Secondary | ICD-10-CM | POA: Diagnosis not present

## 2024-04-05 DIAGNOSIS — L570 Actinic keratosis: Secondary | ICD-10-CM | POA: Diagnosis not present

## 2024-04-05 DIAGNOSIS — D225 Melanocytic nevi of trunk: Secondary | ICD-10-CM | POA: Diagnosis not present

## 2024-04-05 DIAGNOSIS — L821 Other seborrheic keratosis: Secondary | ICD-10-CM | POA: Diagnosis not present

## 2024-04-05 DIAGNOSIS — B078 Other viral warts: Secondary | ICD-10-CM | POA: Diagnosis not present

## 2024-04-06 ENCOUNTER — Other Ambulatory Visit: Payer: Self-pay | Admitting: Licensed Clinical Social Worker

## 2024-04-06 ENCOUNTER — Telehealth: Admitting: Licensed Clinical Social Worker

## 2024-04-06 DIAGNOSIS — F32A Depression, unspecified: Secondary | ICD-10-CM

## 2024-04-06 DIAGNOSIS — F411 Generalized anxiety disorder: Secondary | ICD-10-CM

## 2024-04-06 NOTE — Patient Outreach (Signed)
 Complex Care Management   Visit Note  04/07/2024  Name:  [name] MRN: [id number] DOB: 06-02-1946  Situation: Referral received for Complex Care Management related to Mental/Behavioral Health diagnosis depression/anxiety. I obtained verbal consent from Patient.  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   Mitral regurgitation    Osteopenia 02/18/2012   Osteoporosis    PAF (paroxysmal atrial fibrillation) (HCC) 2012   Peripheral neuropathy 11/06/2011   PMDD (premenstrual dysphoric disorder)    PONV (postoperative nausea and vomiting)    nausea, vomiting, hives and dizziness    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/2020   Complete bilateral atrial lesion set using cryothermy and bipolar radiofrequency ablation with clipping of LA appendage via right mini-thoracotomy approach   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   Complex valvuloplasty including artificial Gore-tex neochord placement x12 with 32mm Sorin Memo 4D ring annuloplasty   Seasonal allergies    Vaginal delivery    ONE NSVD   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    Assessment: Patient Reported Symptoms:  Cognitive Cognitive Status: Able to follow simple commands, Alert and oriented to person, place, and time Cognitive/Intellectual Conditions Management [RPT]: None reported or documented in medical history or problem list   Health Maintenance Behaviors:  Annual physical exam, Stress management Healing Pattern: Slow Health Facilitated by: Rest, Prayer/meditation, Stress management  Neurological Neurological Review of Symptoms: No symptoms reported Neurological Management Strategies: Medication therapy, Routine screening, Adequate rest, Coping strategies Neurological Self-Management Outcome: 3 (uncertain)  Psychosocial Additional Psychological Details: New psychiatry referral placed on 04/06/24. Patient reports her main stressors are her health, her son's health and her finances. Behavioral Management Strategies: Coping strategies, Medication therapy Behavioral Health Self-Management Outcome: 4 (good) Major Change/Loss/Stressor/Fears (CP): Resources Techniques to [employer] with Loss/Stress/Change: Diversional activities, Medication Quality of Family Relationships: supportive Do you feel physically threatened by others?: No    04/07/2024    PHQ2-9 Depression Screening   Little interest or pleasure in doing things Several days  Feeling down, depressed, or hopeless Several days  PHQ-2 - Total Score 2  Trouble falling or staying asleep, or sleeping too much Several days  Feeling tired or having little energy More than half the days  Poor appetite or overeating  Not at all  Feeling bad about yourself - or that you are a failure or have let yourself or your family down Not at all  Trouble concentrating on things, such as reading the newspaper or watching television Not at all  Moving or speaking so slowly that other people could have noticed.  Or the opposite - being so fidgety or restless that you have been moving around a lot more than usual Several days  Thoughts that you would be better off dead, or hurting yourself in some way Not at all  PHQ2-9 Total Score  6  If you checked off any problems, how difficult have these problems made it for you to do your work, take care of things at home, or get along with other people Somewhat difficult  Depression  Interventions/Treatment Referral to Psychiatry, Medication, Counseling    There were no vitals filed for this visit.  Medications Reviewed Today     Reviewed by Merlynn Lyle CROME, LCSW (Social Worker) on 04/07/24 at [phone]  Med List Status: <None>   Medication Order Taking? Sig Documenting Provider Last Dose Status Informant  acetaminophen  (TYLENOL ) 500 MG tablet [id number]  Take 500 mg by mouth every 6 (six) hours as needed (pain.). [provider]  Active Self  amiodarone  (PACERONE ) 200 MG tablet [id number]  Take 200 mg by mouth daily. [provider]  Active   apixaban  (ELIQUIS ) 2.5 MG TABS tablet [id number]  Take 1 tablet (2.5 mg total) by mouth 2 (two) times daily. Pietro Redell RAMAN, MD  Active   calcium  carbonate (OS-CAL) 1250 (500 Ca) MG chewable tablet [id number]  Chew 1 tablet by mouth every evening. [provider]  Active Self  Calcium  Carbonate Antacid (TUMS CHEWY BITES PO) [id number]  Take 1 tablet by mouth daily as needed (reflux).  [provider]  Active Self  Carboxymethylcellul-Glycerin  (LUBRICATING EYE DROPS OP) [id number]  Place 1 drop into both eyes 3 (three) times daily as needed (dry/irritated eys.). [provider]  Active Self  cephALEXin  (KEFLEX ) 500 MG capsule [id number]  Rake all 4 tablets 1 hour prior to dental procedure Pietro Redell RAMAN, MD  Active   Cholecalciferol (VITAMIN D -3 PO) [id number]  Take 1 tablet by mouth every evening. [provider]  Active Self  clonazePAM  (KLONOPIN ) 0.5 MG tablet [id number]  Take 0.5 tablets (0.25 mg total) by mouth 2 (two) times daily as needed for anxiety. Tabori, Katherine E, MD  Active   cyanocobalamin  (VITAMIN B12) 1000 MCG/ML injection [id number]  Inject 1,000 mcg into the muscle every 30 (thirty) days. [provider]  Active Self  denosumab  (PROLIA ) 60 MG/ML SOSY injection [id number]  Inject 60 mg into the skin every 6 (six) months. [provider]  Active Self            Med Note JACKOLYN APOLINAR JONELLE Pablo Jun 11, 2020  2:33 PM)    furosemide  (LASIX ) 20 MG tablet [id number]  Take 20 mg by mouth every Tuesday, Thursday, and Saturday at 6 PM. Per patient taking 60 mg on these days [provider]  Active Self  furosemide  (LASIX ) 40 MG tablet [id number]  Take 1 tablet (40 mg total) by mouth daily. Pietro Redell RAMAN, MD  Active   levalbuterol  (XOPENEX  HFA) 45 MCG/ACT inhaler [id number]  Inhale 2 puffs into the lungs every 4 (four) hours as needed for wheezing. Kara Dorn NOVAK, MD  Active   levothyroxine  (SYNTHROID ) 75 MCG tablet [id number]  Take 1 tablet (75 mcg total) by mouth daily. [name], MD  Active   loratadine (CLARITIN) 10 MG tablet [id number]  Take 10 mg by mouth in the morning. [provider]  Active Self  meclizine  (ANTIVERT ) 25 MG tablet [id number]  TAKE 1 TABLET BY MOUTH THREE TIMES DAILY AS NEEDED FOR DIZZINESS Tabori, Katherine E, MD  Active   metoprolol  tartrate (LOPRESSOR ) 25 MG tablet [id number]  Take 1 tablet by mouth twice daily Pietro Redell RAMAN, MD  Active Self  mupirocin ointment (BACTROBAN) 2 % [id number]  Apply 1 Application topically daily. [provider]  Active Self  nystatin  (MYCOSTATIN /NYSTOP ) powder [id number]  Apply topically 2 (two) times daily as needed (skin irritation (under breasts)). Maurice Sharlet RAMAN, PA-C  Active Self  ondansetron  (ZOFRAN ) 8 MG tablet [id number]  Take 1 tablet (8 mg total) by mouth every 8 (eight) hours as needed for nausea or vomiting. Tabori, Katherine E, MD  Active Self  ondansetron  (ZOFRAN -ODT) 4 MG disintegrating tablet [id number]  Take 1 tablet (4 mg total) by mouth every 8 (eight) hours as needed for nausea or vomiting. Ruthe Cornet, DO  Active   oxyCODONE  (ROXICODONE ) 5 MG/5ML solution [id number]  Take 4 mLs (4 mg total) by mouth every 4 (four) hours as needed for moderate pain. Maurice Sharlet RAMAN, PA-C  Active Self           Med Note JACKOLYN APOLINAR JONELLE Pablo Jun 11, 2020  2:33  PM)    pantoprazole  (PROTONIX ) 40 MG tablet [id number]  Take 1 tablet by mouth once daily Dewald, Jonathan B, MD  Active Self  polyethylene glycol (MIRALAX  / GLYCOLAX ) packet [id number]  Take 17 g by mouth daily as needed for mild constipation. [provider]  Active Self  potassium chloride  SA (KLOR-CON  M) 20 MEQ tablet [id number]  Take 1 tablet (20 mEq total) by mouth 3 (three) times a week. Take on Tuesdays, Thursdays and Saturdays when you take Lasix  60 mg. West, Katlyn D, NP  Active   revefenacin  (YUPELRI ) 175 MCG/3ML nebulizer solution [id number]  Inhale one vial in nebulizer once daily. Do not mix with other nebulized medications. Kara Dorn NOVAK, MD  Active   sodium chloride  (OCEAN) 0.65 % SOLN nasal spray [id number]  Place 1 spray into both nostrils as needed for congestion (nose bleeds). [provider]  Active Self  Ubrogepant (UBRELVY) 100 MG TABS [id number]  Take 25 mg by mouth daily as needed (headaches/migraines). [provider]  Active Self  valACYclovir  (VALTREX ) 500 MG tablet [id number]  Take 500 mg by mouth daily as needed (breakouts). [provider]  Active Self            Recommendation:   PCP Follow-up Continue Current Plan of Care  Follow Up Plan:   Telephone follow-up in 1 month  Lyle Rung, BSW, MSW, LCSW Licensed Clinical Social Worker [employer]   [email or web address] Direct Dial: [phone]

## 2024-04-07 ENCOUNTER — Other Ambulatory Visit: Payer: Self-pay | Admitting: Family Medicine

## 2024-04-07 DIAGNOSIS — F32A Depression, unspecified: Secondary | ICD-10-CM

## 2024-04-07 NOTE — Patient Instructions (Signed)
 Visit Information  Thank you for taking time to visit with me today. Please don't hesitate to contact me if I can be of assistance to you before our next scheduled appointment.  Our next appointment is by telephone on 05/05/24 at 3;15 pm Please call the care guide team at [phone] if you need to cancel or reschedule your appointment.   Following is a copy of your care plan:   Goals Addressed             This Visit's Progress    LCSW [facility] Social Work Care Plan       Problems:  Chronic Disease Management support and education needs related to High Risk for Falls  Goal: Over the next 3 months the Patient will demonstrate ongoing self health (physical and mental) care management ability and gain necessary behavioral health support and resource connection as evidenced by successfully engaging with [facility] services.   Interventions:   Depressive/Anxiety Interventions:  Current barriers:   Chronic Mental Health needs related to symptoms of depression, stress and anxiety. Patient requires Support, Education, Resources, Referrals, Advocacy, and Care Coordination, in order to meet Unmet Mental Health Needs and to find a therapist and psychiatrist. Mental Health Concerns and Social Isolation Patient lacks knowledge of local and available community resources and counseling agencies.   Clinical Goal(s): verbalize understanding of plan for management of Anxiety, Depression, and Stress symptoms and demonstrate a reduction in symptoms. Patient will connect with a provider for ongoing mental health treatment, increase coping skills, healthy habits, self-management skills, and stress reduction      Patient will implement clinical interventions discussed today to decrease symptoms of depression and increase knowledge and/or ability of: coping skills.    Clinical Interventions:  Assessed patient's previous and current treatment, coping skills, support system and barriers to care. Patient provided  hx  Verbalization of feelings encouraged, motivational interviewing employed Emotional support provided, positive coping strategies explored. Establishing healthy boundaries emphasized and healthy self-care education provided Patient was educated on available mental health resources within their area that accept her insurance and offer counseling and psychiatry. Patient was advised to contact the back of her insurance card for assistance with benefits as well. Patient is agreeable to referral to [facility] for counseling and psychiatry. [facility] LCSW made referral on 03/03/24 but this referral was denied.  Emotional support provided. CBT intervention implemented regarding being mentally fit by combating negative thinking and replacing it with uplifting support, hope and positivity. Assessed social determinant of health barriers Discussed use of relaxation techniques and/or diversional activities to assist with pain reduction (distraction, imagery, relaxation, massage, acupressure, TENS, heat, and cold application; Reviewed with patient prescribed pharmacological and nonpharmacological pain relief strategies; Patient will work on implementing appropriate self-care habits into their daily routine such as: staying positive, writing a gratitude list, drinking water, staying active around the house, taking their medications as prescribed, combating negative thoughts or emotions and staying connected with their family and friends. Positive reinforcement provided for this decision to work on this. Motivational Interviewing employed Depression screen reviewed  PHQ2/ PHQ9 completed or reviewed  Mindfulness or Relaxation training provided Active listening / Reflection utilized  Emotional Support Provided Problem Solving /Task Center strategies reviewed Provided psychoeducation for mental health needs  Provided brief CBT  Reviewed mental health medications and discussed importance of compliance:  Quality of sleep  assessed & Sleep Hygiene techniques promoted  Participation in counseling encouraged  Verbalization of feelings encouraged  Suicidal Ideation/Homicidal Ideation assessed: Patient denies  SI/HI  Review resources, discussed options and provided patient information about  Mental Health Resources Inter-disciplinary care team collaboration (see longitudinal plan of care) Referral to [facility]BCI BSW for SDOH needs Referral to [facility] was denied as unfortunately, we believe patient needs a higher level of care. It has been recommended to see a Geriatric Psych. [facility]I LCSW will sent update to PCP and inquire. Find help logged referrals placed on 03/18/24. New psychiatry referral placed on 04/06/24.  Patient Self-Care Activities:  Attend all scheduled provider appointments Call provider office for new concerns or questions  Perform all self care activities independently  Take medications as prescribed   Work with the social worker to address care coordination needs and will continue to work with the clinical team to address health care, SDOH barriers and disease management related needs  Plan:  The patient has been provided with contact information for the care management team and has been advised to call with any health related questions or concerns.  Referral to BSW to help patient manage daily financial stressors better Next RN Care Management appointment with Channing is on 03/21/2024 at 9 am. Next BSW Care Management appointment with Orlean is on 03/22/2024 at 2:30 pm.              Please call the Suicide and Crisis Lifeline: 988 call the [place]  National Suicide Prevention Lifeline: 548-630-0411 or TTY: 2628026487 TTY 7268889299) to talk to a trained counselor call 1-800-273-TALK (toll free, 24 hour hotline) go to [facility] [address], [place] [phone]) call 911 if you are experiencing a Mental Health or Behavioral Health Crisis or need someone  to talk to.  Patient verbalizes understanding of instructions and care plan provided today and agrees to view in MyChart. Active MyChart status and patient understanding of how to access instructions and care plan via MyChart confirmed with patient.     Lyle Rung, BSW, MSW, LCSW Licensed Clinical Social Worker [employer]   [facility] [email or web address] Direct Dial: [phone]

## 2024-04-07 NOTE — Progress Notes (Signed)
 Ordered was placed by LCSW yesterday for psychiatry referral.  Per the note from [facility], they are recommending Geri Psych for pt.  New referral placed

## 2024-04-08 ENCOUNTER — Encounter: Payer: Self-pay | Admitting: Student

## 2024-04-08 ENCOUNTER — Ambulatory Visit: Admitting: Student

## 2024-04-08 ENCOUNTER — Ambulatory Visit: Attending: Student | Admitting: Student

## 2024-04-08 VITALS — BP 110/56 | HR 57 | Ht 66.0 in | Wt 109.0 lb

## 2024-04-08 DIAGNOSIS — G4733 Obstructive sleep apnea (adult) (pediatric): Secondary | ICD-10-CM | POA: Diagnosis not present

## 2024-04-08 DIAGNOSIS — R0609 Other forms of dyspnea: Secondary | ICD-10-CM | POA: Insufficient documentation

## 2024-04-08 DIAGNOSIS — I2510 Atherosclerotic heart disease of native coronary artery without angina pectoris: Secondary | ICD-10-CM | POA: Insufficient documentation

## 2024-04-08 DIAGNOSIS — I480 Paroxysmal atrial fibrillation: Secondary | ICD-10-CM | POA: Insufficient documentation

## 2024-04-08 DIAGNOSIS — I340 Nonrheumatic mitral (valve) insufficiency: Secondary | ICD-10-CM | POA: Insufficient documentation

## 2024-04-08 DIAGNOSIS — I10 Essential (primary) hypertension: Secondary | ICD-10-CM | POA: Insufficient documentation

## 2024-04-08 DIAGNOSIS — E785 Hyperlipidemia, unspecified: Secondary | ICD-10-CM | POA: Diagnosis not present

## 2024-04-08 DIAGNOSIS — R0789 Other chest pain: Secondary | ICD-10-CM | POA: Insufficient documentation

## 2024-04-08 DIAGNOSIS — Z9889 Other specified postprocedural states: Secondary | ICD-10-CM | POA: Insufficient documentation

## 2024-04-08 DIAGNOSIS — I5032 Chronic diastolic (congestive) heart failure: Secondary | ICD-10-CM | POA: Diagnosis not present

## 2024-04-08 NOTE — Patient Instructions (Addendum)
 Thank you for choosing [facility]!     Medication Instructions:  No medication changes were made during today's visit.   PURCHASE COMPRESSION STOCKING FROM [facility].   *If you need a refill on your cardiac medications before your next appointment, please call your pharmacy*   Lab Work: PRO-BNP If you have labs (blood work) drawn today and your tests are completely normal, you will receive your results only by: MyChart Message (if you have MyChart) OR A paper copy in the mail If you have any lab test that is abnormal or we need to change your treatment, we will call you to review the results.   Testing/Procedures: Your physician has requested that you have an echocardiogram. Echocardiography is a painless test that uses sound waves to create images of your heart. It provides your doctor with information about the size and shape of your heart and how well your heart's chambers and valves are working. This procedure takes approximately one hour. There are no restrictions for this procedure. Please do NOT wear cologne, perfume, aftershave, or lotions (deodorant is allowed). Please arrive 15 minutes prior to your appointment time.  Please note: We ask at that you not bring children with you during ultrasound (echo/ vascular) testing. Due to room size and safety concerns, children are not allowed in the ultrasound rooms during exams. Our front office staff cannot provide observation of children in our lobby area while testing is being conducted. An adult accompanying a patient to their appointment will only be allowed in the ultrasound room at the discretion of the ultrasound technician under special circumstances. We apologize for any inconvenience.     Follow-Up: At [facility], you and your health needs are our priority.  As part of our continuing mission to provide you with exceptional heart care, we have created designated Provider Care Teams.  These Care Teams include  your primary Cardiologist (physician) and Advanced Practice Providers (APPs -  Physician Assistants and Nurse Practitioners) who all work together to provide you with the care you need, when you need it. We recommend signing up for the patient portal called MyChart.  Sign up information is provided on this After Visit Summary.  MyChart is used to connect with patients for Virtual Visits (Telemedicine).  Patients are able to view lab/test results, encounter notes, upcoming appointments, etc.  Non-urgent messages can be sent to your provider as well.   To learn more about what you can do with MyChart, go to [email or web address].

## 2024-04-11 ENCOUNTER — Telehealth: Payer: Self-pay | Admitting: Cardiology

## 2024-04-11 ENCOUNTER — Other Ambulatory Visit: Payer: Self-pay

## 2024-04-11 ENCOUNTER — Ambulatory Visit: Payer: Self-pay

## 2024-04-11 ENCOUNTER — Ambulatory Visit: Admitting: Family Medicine

## 2024-04-11 DIAGNOSIS — R0789 Other chest pain: Secondary | ICD-10-CM | POA: Diagnosis not present

## 2024-04-11 DIAGNOSIS — I5032 Chronic diastolic (congestive) heart failure: Secondary | ICD-10-CM | POA: Diagnosis not present

## 2024-04-11 DIAGNOSIS — G4734 Idiopathic sleep related nonobstructive alveolar hypoventilation: Secondary | ICD-10-CM

## 2024-04-11 NOTE — Telephone Encounter (Signed)
 Spoke with pt, aware we are waiting for the results of the lab work to decide if changes need to be made in medications. She is concerned about the swelling in her legs. She was able to get her echo moved up to this week and aware the echo will show us  the function of the heart and the valves. She plans on having the la work today.

## 2024-04-11 NOTE — Telephone Encounter (Signed)
  Pt stated she called for results from her overnight O2 sat  testing Dr Luann office. Will forward to office to see if back   Copied from CRM #[id number]. Topic: Clinical - Red Word Triage >> Apr 11, 2024 12:16 PM [name] wrote: Red Word that prompted transfer to Nurse Triage: shortness of breath and swelling in her legs and ankles. Answer Assessment - Initial Assessment Questions 1. REASON FOR CALL: What is the main reason for your call? or How can I best help you?     Wanting O2 sat testing information 2. SYMPTOMS : Do you have any symptoms?      SOB  Protocols used: Information Only Call - No Triage-A-AH

## 2024-04-11 NOTE — Telephone Encounter (Signed)
 Tried to reach out to patient VM/ LM okay per DPR -  provider has not had a chance to result ono some one will reach out when the results are reviewed    - NFN

## 2024-04-11 NOTE — Telephone Encounter (Signed)
 Pt c/o swelling/edema: STAT if pt has developed SOB within 24 hours  If swelling, where is the swelling located?   Ankles  How much weight have you gained and in what time span?   No  Have you gained 2 pounds in a day or 5 pounds in a week?   No  Do you have a log of your daily weights (if so, list)?   No  Are you currently taking a fluid pill?   Yes  Are you currently SOB?   Yes - ongoing  Have you traveled recently in a car or plane for an extended period of time?   No  Patient wants a call back to discuss next steps regarding swelling and patient wants orders for blood work sent to [facility] in [place].

## 2024-04-11 NOTE — Telephone Encounter (Signed)
 Patient spent 1hr 5 minutes with an SpO2 less than 88%. Recommend she use 2L of oxygen with humidification if able. I know she has history of severe epistaxis so this may be a hindrance to therapy.  Thanks, JD

## 2024-04-12 ENCOUNTER — Ambulatory Visit: Payer: Self-pay | Admitting: Student

## 2024-04-12 ENCOUNTER — Telehealth: Payer: Self-pay

## 2024-04-12 DIAGNOSIS — I50810 Right heart failure, unspecified: Secondary | ICD-10-CM

## 2024-04-12 DIAGNOSIS — I272 Pulmonary hypertension, unspecified: Secondary | ICD-10-CM

## 2024-04-12 DIAGNOSIS — I5032 Chronic diastolic (congestive) heart failure: Secondary | ICD-10-CM

## 2024-04-12 LAB — PRO B NATRIURETIC PEPTIDE: NT-Pro BNP: 2945 pg/mL — ABNORMAL HIGH (ref 0–738)

## 2024-04-12 MED ORDER — FUROSEMIDE 40 MG PO TABS: 60.0000 mg | ORAL_TABLET | Freq: Every day | ORAL | 2 refills | Status: DC

## 2024-04-12 MED ORDER — POTASSIUM CHLORIDE CRYS ER 20 MEQ PO TBCR: 20.0000 meq | EXTENDED_RELEASE_TABLET | Freq: Every day | ORAL | 2 refills | Status: AC

## 2024-04-12 NOTE — Telephone Encounter (Signed)
 Copied from CRM [id number]. Topic: Clinical - Lab/Test Results >> Apr 11, 2024 12:40 PM Rilla B wrote: Reason for CRM: Patient calling to see the results of pulse ox.  Patient state she is short of breath, legs and ankles are swelling and only wants to talk to Dr Kara or his team. Patient was red work today.  Please call patient at [phone]. Patient is currently getting blood drawn.  Patient asks that if she does not answer, please call her back.     Already addressed

## 2024-04-12 NOTE — Patient Outreach (Signed)
 04/11/2024   Spoke to patient at our scheduled appt today at 10 am. Patient reported that she has been following up with her cardiologist regarding increased cardiac symptoms including increased swelling to her ankles. Has repeat Echo scheduled for Wednesday, 04/13/24 and is scheduled for a Pro-BNP. Patient and RNCM agreed to reschedule a follow up call & assessment after Echo and labs are completed. F/U w/ RNCM scheduled for Friday 04/15/24 at 10 am. Per Cardiologist, depending on what Pro-BNP shows, patient may be switched from Lasix  to Torsemide or may have Spironolactone added along to Lasix .  Recommended compression stockings to help with lower extremity edema, however patient states she cannot get them on her legs herself even with the help of a device that is intended for compression stockings application. Encouraged patient to elevate her legs up as much as possible in lieu of not being able to apply stockings.   Menucha Dicesare A. Gordy RN, BA, [facility], CRRN [place]  [facility] Population Health RN Care Manager Direct Dial: [phone]  Fax: [phone]

## 2024-04-12 NOTE — Addendum Note (Signed)
 Addended by: ARMAND SOR R on: 04/12/2024 09:15 AM   Modules accepted: Orders

## 2024-04-12 NOTE — Patient Instructions (Signed)
 Visit Information  Thank you for taking time to visit with me today. Please don't hesitate to contact me if I can be of assistance to you before our next scheduled telephone appointment.  Our next appointment is by telephone on 04/15/24 at 10 am.  Following is a copy of your care plan:   Goals Addressed             This Visit's Progress    VBCI RN Care Plan       Problems:  Chronic Disease Management support and education needs related to High Risk for Falls  Goal: Over the next 3 months the Patient will demonstrate ongoing self health care management ability and take all Fall Precautions necessary to ensure no further falls as evidenced by    no further falls  Interventions:   Falls Interventions: Provided written and verbal education re: potential causes of falls and Fall prevention strategies Reviewed medications and discussed potential side effects of medications such as dizziness and frequent urination Advised patient of importance of notifying provider of falls Assessed for signs and symptoms of orthostatic hypotension Assessed patients knowledge of fall risk prevention secondary to previously provided education Provided patient information for fall alert systems  Patient Self-Care Activities:  Attend all scheduled provider appointments Call provider office for new concerns or questions  Perform all self care activities independently  Take medications as prescribed   Work with the social worker to address care coordination needs and will continue to work with the clinical team to address health care and disease management related needs 02/29/24 RNCM engagement - patient reports no falls since last RNCM call on 02/15/24. Continues to complain of chronic pain and takes oxycodone  as needed as well as 1/2 tab (= dose of 0.25mg ) of Klonopin  up to 2Xs/day. 03/04/24 RNCM engagement - patient reports no falls and that she did connnect with the LCSW, Lyle Rung yesterday to  discuss ways to manage stress. We also talked about the difference between the LCSW role and the BSW role of our Social Workers and that patient's appt with Orlean Fey on 03/09/24 will focus on patient's SDOH barriers and financial challenges. Patient is eager to talk to the BSW about possible resources available to her and her disabled son who lives with her. 03/11/2024 RNCM f/u - patient states her health has been stable. Patient also expressed appreciation for BSW referral - felt Orlean Fey was very helpful and states she is awaiting some materials sent to her by the BSW regarding SDOH resources patient may be able to utilize/apply for  Plan:  The patient has been provided with contact information for the care management team and has been advised to call with any health related questions or concerns.  Referral to LCSW to help patient manage daily stressors better Next RN Care Management appointment with Channing is on 04/15/2024 at 10:00 am.             Patient verbalizes understanding of instructions and care plan provided today and agrees to view in MyChart. Active MyChart status and patient understanding of how to access instructions and care plan via MyChart confirmed with patient.     The patient has been provided with contact information for the care management team and has been advised to call with any health related questions or concerns.   Please call the care guide team at [phone] if you need to cancel or reschedule your appointment.   Please call 1-800-273-TALK (toll free, 24 hour hotline) if  you are experiencing a Mental Health or Behavioral Health Crisis or need someone to talk to.  Quiera Diffee A. Gordy RN, BA, [facility], CRRN [place]  [facility] Population Health RN Care Manager Direct Dial: [phone]  Fax: [phone]

## 2024-04-12 NOTE — Telephone Encounter (Addendum)
 Spoke with patient  VBU.  order has been placed

## 2024-04-13 ENCOUNTER — Ambulatory Visit (HOSPITAL_COMMUNITY)
Admission: RE | Admit: 2024-04-13 | Discharge: 2024-04-13 | Disposition: A | Discharge status: Home / Self Care | Source: Ambulatory Visit | Attending: Student | Admitting: Student

## 2024-04-13 ENCOUNTER — Telehealth: Payer: Self-pay

## 2024-04-13 DIAGNOSIS — Z87891 Personal history of nicotine dependence: Secondary | ICD-10-CM | POA: Diagnosis not present

## 2024-04-13 DIAGNOSIS — I371 Nonrheumatic pulmonary valve insufficiency: Secondary | ICD-10-CM | POA: Insufficient documentation

## 2024-04-13 DIAGNOSIS — Z9889 Other specified postprocedural states: Secondary | ICD-10-CM | POA: Diagnosis not present

## 2024-04-13 DIAGNOSIS — I082 Rheumatic disorders of both aortic and tricuspid valves: Secondary | ICD-10-CM | POA: Diagnosis not present

## 2024-04-13 DIAGNOSIS — I4891 Unspecified atrial fibrillation: Secondary | ICD-10-CM | POA: Diagnosis not present

## 2024-04-13 DIAGNOSIS — R0789 Other chest pain: Secondary | ICD-10-CM | POA: Diagnosis not present

## 2024-04-13 DIAGNOSIS — R0602 Shortness of breath: Secondary | ICD-10-CM | POA: Diagnosis not present

## 2024-04-13 DIAGNOSIS — I119 Hypertensive heart disease without heart failure: Secondary | ICD-10-CM | POA: Diagnosis not present

## 2024-04-13 LAB — ECHOCARDIOGRAM COMPLETE
MV VTI: 1.15 cm2
S' Lateral: 2.8 cm

## 2024-04-13 NOTE — Telephone Encounter (Signed)
 Copied from CRM #[id number]. Topic: Clinical - Request for Lab/Test Order >> Apr 13, 2024  2:01 PM [name] wrote: Reason for CRM: Patient states the test that Dr. Kara discussed with her will be covered by her insurance and she would like to discuss next steps as her breathing is getting worse everyday.  Patient is very kindly requesting Dr. Ples nurse to call her today after 4:30 as she is at the hospital having an echocardiogram right now. Patient sounded very weak and short of breath on the phone.    Spoke w/ patient VBU reminded her we discuss this yesterday   Order was placed someone from the DME will reach out to her.  Will ask Dewald about INH the new one notas good as the old one.   The letter was from the DME stating insurance will cover the O2

## 2024-04-13 NOTE — Progress Notes (Signed)
  Echocardiogram 2D Echocardiogram has been performed.  Koleen KANDICE Popper, RDCS 04/13/2024, 3:54 PM

## 2024-04-14 ENCOUNTER — Other Ambulatory Visit

## 2024-04-14 ENCOUNTER — Telehealth: Payer: Self-pay

## 2024-04-14 DIAGNOSIS — G2581 Restless legs syndrome: Secondary | ICD-10-CM | POA: Diagnosis not present

## 2024-04-14 DIAGNOSIS — G603 Idiopathic progressive neuropathy: Secondary | ICD-10-CM | POA: Diagnosis not present

## 2024-04-14 DIAGNOSIS — M5412 Radiculopathy, cervical region: Secondary | ICD-10-CM | POA: Diagnosis not present

## 2024-04-14 DIAGNOSIS — M5417 Radiculopathy, lumbosacral region: Secondary | ICD-10-CM | POA: Diagnosis not present

## 2024-04-14 DIAGNOSIS — G43711 Chronic migraine without aura, intractable, with status migrainosus: Secondary | ICD-10-CM | POA: Diagnosis not present

## 2024-04-14 DIAGNOSIS — G894 Chronic pain syndrome: Secondary | ICD-10-CM | POA: Diagnosis not present

## 2024-04-14 DIAGNOSIS — G5603 Carpal tunnel syndrome, bilateral upper limbs: Secondary | ICD-10-CM | POA: Diagnosis not present

## 2024-04-14 NOTE — Telephone Encounter (Signed)
 Copied from CRM [id number]. Topic: General - Other >> Apr 11, 2024  3:35 PM Essie A wrote: Reason for CRM: Patient was returning a call to the office.  She will wait for a return call when her results are in.  Thanks.  Burnard has spoken to pt yesterday. NFN

## 2024-04-15 ENCOUNTER — Other Ambulatory Visit: Payer: Self-pay

## 2024-04-15 ENCOUNTER — Telehealth: Payer: Self-pay

## 2024-04-15 NOTE — Telephone Encounter (Signed)
 Copied from CRM [id number]. Topic: Clinical - Lab/Test Results >> Apr 13, 2024  9:50 AM Devaughn RAMAN wrote: Reason for CRM: Patient called and stated she did a sleep apnea test on last week at home and then she received a phone call that she was qualified by Medicare for oxygen measurements for sleep apnea. Patient would like to inquire if Dr.Dewald received this information.

## 2024-04-15 NOTE — Patient Outreach (Signed)
 BSW called patient back after she had reached out the previous evening. During the call, patient informed BSW that her cataract surgery appointment is scheduled for November 4 at 1:00 PM and stated that she would like to schedule transportation for the appointment. BSW reminded patient that she had previously declined transportation through the [employer] and had chosen to use [facility] for future transportation needs. BSW informed patient that she should contact [facility] directly to schedule her ride for November 4 at 1:00 PM. Patient verbalized understanding and stated that she will contact [facility] to arrange transportation.  Orlean Fey, BSW [place]  [facility] Social Worker, [employer] [facility] [phone]

## 2024-04-15 NOTE — Telephone Encounter (Signed)
 Already been address.    -NFN

## 2024-04-17 NOTE — Patient Outreach (Signed)
 Complex Care Management   Visit Note  04/17/2024  Name:  [name] MRN: [id number] DOB: 05-21-1946  Situation: Referral received for Complex Care Management related to Persistent Atrial Fibrillation, and Chronic Dyspnea I obtained verbal consent from Patient.  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   Mitral regurgitation    Osteopenia 02/18/2012   Osteoporosis    PAF (paroxysmal atrial fibrillation) (HCC) 2012   Peripheral neuropathy 11/06/2011   PMDD (premenstrual dysphoric disorder)    PONV (postoperative nausea and vomiting)    nausea, vomiting, hives and dizziness    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/2020   Complete bilateral atrial lesion set using cryothermy and bipolar radiofrequency ablation with clipping of LA appendage via right mini-thoracotomy approach   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   Complex valvuloplasty including artificial Gore-tex neochord placement x12 with 32mm Sorin Memo 4D ring annuloplasty   Seasonal allergies    Vaginal delivery    ONE NSVD   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    Assessment: Patient Reported Symptoms:  Cognitive Cognitive Status: Able to follow simple commands, Alert and oriented to person, place, and time, Insightful and able to interpret abstract concepts, Normal speech and language skills Cognitive/Intellectual Conditions Management [RPT]: None  reported or documented in medical history or problem list   Health Maintenance Behaviors: Annual physical exam, Stress management Healing Pattern: Slow Health Facilitated by: Rest, Prayer/meditation, Stress management  Neurological Neurological Review of Symptoms: No symptoms reported Neurological Management Strategies: Medication therapy, Routine screening, Adequate rest, Coping strategies Neurological Self-Management Outcome: 4 (good)  HEENT HEENT Symptoms Reported: Change or loss of hearing HEENT Management Strategies: Coping strategies, Routine screening HEENT Self-Management Outcome: 3 (uncertain)    Cardiovascular   Cardiovascular Management Strategies: Coping strategies, Adequate rest, Medication therapy, Diet modification, Weight management, Medical device Do You Have a Working Readable Scale?: Yes Cardiovascular Self-Management Outcome: 3 (uncertain) Cardiovascular Comment: On 10/8 had repeat 2D Echo, Color Doppler and Cardiac Doppler due increased symptoms of SOB and increased swelling to bilateral ankles - has f/u with Cardiologist scheduled to discuss results.  Respiratory Respiratory Symptoms Reported: Shortness of breath Other Respiratory Symptoms: On 10/8 had repeat 2D Ech, Color Doppler and Cardiac Doppler due increased symptoms of SOB and increased swelling to bilateral ankles - has f/u with Cardiologist scheduled to discuss results. Additional Respiratory Details: Has new order for humidifed O2 a 2L prn - patient plans to routinely use at night to help with chronic dyspnea and OSA Respiratory Management Strategies: Adequate rest, Breathing techniques, Fluid modification, Coping strategies, Oxygen therapy, Routine screening, Weight management Respiratory Self-Management Outcome: 3 (uncertain)  Endocrine Endocrine Symptoms Reported: No symptoms reported    Gastrointestinal        Genitourinary      Integumentary Integumentary Symptoms Reported: No symptoms reported  Musculoskeletal Musculoskelatal Symptoms Reviewed: Weakness, Limited mobility        Psychosocial Psychosocial Symptoms Reported: No symptoms reported, Anxiety - if selected complete GAD Behavioral Management Strategies: Coping strategies, Medication therapy, Counseling Behavioral Health Self-Management Outcome: 4 (good) Behavioral Health Comment: Being followed up LCSW Lyle Rung Major Change/Loss/Stressor/Fears (CP): Resources Techniques to [employer] with Loss/Stress/Change: Diversional activities, Medication Quality of Family Relationships: supportive Do you feel physically threatened by others?: No    04/17/2024    PHQ2-9 Depression Screening   Little interest or pleasure in doing things Several days  Feeling down, depressed, or hopeless Several days  PHQ-2 - Total Score 2  Trouble falling or staying asleep, or sleeping too much    Feeling tired or having little energy    Poor appetite or overeating     Feeling bad about yourself - or that you are a failure or have let yourself or your family down    Trouble concentrating on things, such as reading the newspaper or watching television    Moving or speaking so slowly that other people could have noticed.  Or the opposite - being so fidgety or restless that you have been moving around a lot more than usual    Thoughts that you would be better off dead, or hurting yourself in some way    PHQ2-9 Total Score    If you checked off any problems, how difficult have these problems made it for you to do your work, take care of things at home, or get along with other people    Depression Interventions/Treatment      There were no vitals filed for this visit.  Medications Reviewed Today     Reviewed by Gordy Channing LABOR, RN (Registered Nurse) on 04/15/24 at 1047  Med List Status: <None>   Medication Order Taking? Sig Documenting Provider Last Dose Status Informant  acetaminophen  (TYLENOL ) 500 MG tablet [id number] Yes Take 500 mg by mouth  every 6 (six) hours as needed (pain.). [provider]  Active Self  amiodarone  (PACERONE ) 200 MG tablet [id number] Yes Take 200 mg by mouth daily. [provider]  Active   apixaban  (ELIQUIS ) 2.5 MG TABS tablet [id number] Yes Take 1 tablet (2.5 mg total) by mouth 2 (two) times daily. Pietro Redell RAMAN, MD  Active   calcium  carbonate (OS-CAL) 1250 (500 Ca) MG chewable tablet [id number] Yes Chew 1 tablet by mouth every evening. [provider]  Active Self  Calcium  Carbonate Antacid (TUMS CHEWY BITES PO) [id number] Yes Take 1 tablet by mouth daily as needed (reflux).  [provider]  Active Self  Carboxymethylcellul-Glycerin  (LUBRICATING EYE DROPS OP) [id number] Yes Place 1 drop into both eyes 3 (three) times daily as needed (dry/irritated eys.). [provider]  Active Self  cephALEXin  (KEFLEX ) 500 MG capsule [id number] Yes Rake all 4 tablets 1 hour prior to dental procedure Pietro Redell RAMAN, MD  Active   Cholecalciferol (VITAMIN D -3 PO) [id number] Yes Take 1 tablet by mouth every evening. [provider]  Active Self  clonazePAM  (KLONOPIN ) 0.5 MG tablet [id number] Yes Take 0.5 tablets (0.25 mg total) by mouth 2 (two) times daily as needed for anxiety. Tabori, Katherine E, MD  Active   cyanocobalamin  (VITAMIN B12) 1000 MCG/ML injection [id number] Yes Inject 1,000 mcg into the muscle every 30 (thirty) days. [provider]  Active Self  denosumab  (PROLIA ) 60 MG/ML SOSY injection [id number] Yes Inject 60 mg into the skin every 6 (six) months. [provider]  Active  Self           Med Note JACKOLYN APOLINAR JONELLE Pablo Jun 11, 2020  2:33 PM)    furosemide  (LASIX ) 40 MG tablet [id number] Yes Take 1.5 tablets (60 mg total) by mouth daily. Goodrich, Callie E, PA-C  Active   levalbuterol  (XOPENEX  HFA) 45 MCG/ACT inhaler [id number] Yes Inhale 2 puffs into the lungs every 4 (four) hours as needed for wheezing. Kara Dorn NOVAK, MD  Active    levothyroxine  (SYNTHROID ) 75 MCG tablet [id number] Yes Take 1 tablet (75 mcg total) by mouth daily. [name], MD  Active   loratadine (CLARITIN) 10 MG tablet [id number] Yes Take 10 mg by mouth in the morning. [provider]  Active Self  meclizine  (ANTIVERT ) 25 MG tablet [id number] Yes TAKE 1 TABLET BY MOUTH THREE TIMES DAILY AS NEEDED FOR DIZZINESS Tabori, Katherine E, MD  Active   metoprolol  tartrate (LOPRESSOR ) 25 MG tablet [id number] Yes Take 1 tablet by mouth twice daily Pietro Redell RAMAN, MD  Active Self  mupirocin ointment (BACTROBAN) 2 % [id number] Yes Apply 1 Application topically daily. [provider]  Active Self  nystatin  (MYCOSTATIN /NYSTOP ) powder [id number] Yes Apply topically 2 (two) times daily as needed (skin irritation (under breasts)). Maurice Sharlet RAMAN, PA-C  Active Self  ondansetron  (ZOFRAN ) 8 MG tablet [id number] Yes Take 1 tablet (8 mg total) by mouth every 8 (eight) hours as needed for nausea or vomiting. Tabori, Katherine E, MD  Active Self  ondansetron  (ZOFRAN -ODT) 4 MG disintegrating tablet [id number] Yes Take 1 tablet (4 mg total) by mouth every 8 (eight) hours as needed for nausea or vomiting. Ruthe Cornet, DO  Active   oxyCODONE  (ROXICODONE ) 5 MG/5ML solution [id number] Yes Take 4 mLs (4 mg total) by mouth every 4 (four) hours as needed for moderate pain. Maurice Sharlet RAMAN, PA-C  Active Self           Med Note JACKOLYN APOLINAR JONELLE Pablo Jun 11, 2020  2:33 PM)    pantoprazole  (PROTONIX ) 40 MG tablet [id number] Yes Take 1 tablet by mouth once daily Dewald, Jonathan B, MD  Active Self  polyethylene glycol (MIRALAX  / GLYCOLAX ) packet [id number] Yes Take 17 g by mouth daily as needed for mild constipation. [provider]  Active Self  potassium chloride  SA (KLOR-CON  M) 20 MEQ tablet [id number] Yes Take 1 tablet (20 mEq total) by mouth daily. Goodrich, Callie E, PA-C  Active   revefenacin  (YUPELRI ) 175 MCG/3ML nebulizer solution [id number] Yes Inhale  one vial in nebulizer once daily. Do not mix with other nebulized medications. Kara Dorn NOVAK, MD  Active   sodium chloride  (OCEAN) 0.65 % SOLN nasal spray [id number] Yes Place 1 spray into both nostrils as needed for congestion (nose bleeds). [provider]  Active Self  Ubrogepant (UBRELVY) 100 MG TABS [id number] Yes Take 25 mg by mouth daily as needed (headaches/migraines). [provider]  Active Self  valACYclovir  (VALTREX ) 500 MG tablet [id number] Yes Take 500 mg by mouth daily as needed (breakouts). [provider]  Active Self            Recommendation:   Specialty provider follow-up with Cardiologist to discuss Echo results  Follow Up Plan:   Telephone follow up appointment date/time:  04/28/24 at 10 am.   Channing A. Gordy RN, BA, [facility], CRRN [place]  [facility] Population Health RN Care Manager Direct Dial: [phone]  Fax: [phone]

## 2024-04-18 ENCOUNTER — Ambulatory Visit: Payer: Self-pay | Admitting: Pulmonary Disease

## 2024-04-18 DIAGNOSIS — M961 Postlaminectomy syndrome, not elsewhere classified: Secondary | ICD-10-CM | POA: Diagnosis not present

## 2024-04-18 DIAGNOSIS — M4693 Unspecified inflammatory spondylopathy, cervicothoracic region: Secondary | ICD-10-CM | POA: Diagnosis not present

## 2024-04-18 DIAGNOSIS — M5412 Radiculopathy, cervical region: Secondary | ICD-10-CM | POA: Diagnosis not present

## 2024-04-18 DIAGNOSIS — Z79891 Long term (current) use of opiate analgesic: Secondary | ICD-10-CM | POA: Diagnosis not present

## 2024-04-18 DIAGNOSIS — G894 Chronic pain syndrome: Secondary | ICD-10-CM | POA: Diagnosis not present

## 2024-04-18 NOTE — Telephone Encounter (Signed)
 FYI Only or Action Required?: Action required by provider: clinical question for provider and update on patient condition.  Patient is followed in Pulmonology for nodules/bronchiectasis, last seen on 02/25/2024 by Kara Dorn NOVAK, MD.  Called Nurse Triage reporting Breathing Problem and Care Coordination.  Symptoms began several years ago.  Interventions attempted: Rescue inhaler and Nebulizer treatments.  Symptoms are: unchanged.  Triage Disposition: See PCP Within 2 Weeks  Patient/caregiver understands and will follow disposition?: Yes          Copied from CRM [id number]. Topic: Clinical - Red Word Triage >> Apr 18, 2024  2:06 PM Isabell A wrote: Red Word that prompted transfer to Nurse Triage:  Patient experincing trouble breathing (states it happens all the time) - having other symptoms with her heart/lungs Reason for Disposition  [1] MILD longstanding difficulty breathing (e.g., minimal/no SOB at rest, SOB with walking, pulse < 100) AND [2] SAME as normal    Pt reports recent Echo that showed new bleeding on the R side. Pt reports that she has had ongoing SOB for years and has concerns that new Echo results are contributing to SOB.  Pt denies any new/acute sx. Triager scheduled with LBPU NP for coordination of care/follow up.  Of note, pt reporting that Yupelri  is not providing sx relief and occasionally makes sx worse, so pt has not been using as prescribed. Triager will forward encounter for Dr Kara 's office to review and advise. Patient verbalized understanding and is expecting call back from office for next steps, if any.  Answer Assessment - Initial Assessment Questions E2C2 Pulmonary Triage - Initial Assessment Questions Chief Complaint (e.g., cough, sob, wheezing, fever, chills, sweat or additional symptoms) *Go to specific symptom protocol after initial questions. Can't take a deep breath in  How long have symptoms been present? years  Have you  tested for COVID or Flu? Note: If not, ask patient if a home test can be taken. If so, instruct patient to call back for positive results. No  MEDICINES:   Have you used any OTC meds to help with symptoms? No If yes, ask What medications? N/a  Have you used your inhalers/maintenance medication?  If yes, What medications?  levalbuterol  (XOPENEX  HFA) revefenacin  (YUPELRI ) - reports is not using as prescribed d/t making sx worse  If inhaler, ask How many puffs and how often? Note: Review instructions on medication in the chart. As prescribed  OXYGEN: Do you wear supplemental oxygen? No If yes, How many liters are you supposed to use? N/a  Do you monitor your oxygen levels? No If yes, What is your reading (oxygen level) today? Recently saw pain doctor, but does not recall value  What is your usual oxygen saturation reading?  (Note: Pulmonary O2 sats should be 90% or greater) unknown             1. RESPIRATORY STATUS: Describe your breathing? (e.g., wheezing, shortness of breath, unable to speak, severe coughing)      See above 2. ONSET: When did this breathing problem begin?      See above 3. PATTERN Does the difficult breathing come and go, or has it been constant since it started?      constant 4. SEVERITY: How bad is your breathing? (e.g., mild, moderate, severe)      Mild-moderate Triager does appreciate mild audible SOB during call. Pt is speaking in partial-full sentences.  5. RECURRENT SYMPTOM: Have you had difficulty breathing before? If Yes, ask: When was the last time?  and What happened that time?      N/a 6. CARDIAC HISTORY: Do you have any history of heart disease? (e.g., heart attack, angina, bypass surgery, angioplasty)      Yes, CHF. reports recent Echo done with R sided heart valve concerns/new bleeding 7. LUNG HISTORY: Do you have any history of lung disease?  (e.g., pulmonary embolus, asthma, emphysema)      Nodules per chart. 8. CAUSE: What do you think is causing the breathing problem?      Concern for SOB sx is r.t new cardiac changes 9. OTHER SYMPTOMS: Do you have any other symptoms? (e.g., chest pain, cough, dizziness, fever, runny nose)     denies 10. O2 SATURATION MONITOR:  Do you use an oxygen saturation monitor (pulse oximeter) at home? If Yes, ask: What is your reading (oxygen level) today? What is your usual oxygen saturation reading? (e.g., 95%)       N/a 11. PREGNANCY: Is there any chance you are pregnant? When was your last menstrual period?       N/a 12. TRAVEL: Have you traveled out of the country in the last month? (e.g., travel history, exposures)       N/a  Protocols used: Breathing Difficulty-A-AH

## 2024-04-18 NOTE — Telephone Encounter (Signed)
 Patient scheduled with Madelin Stank NP on 05/03/24.  Nothing further needed.

## 2024-04-19 ENCOUNTER — Telehealth: Payer: Self-pay

## 2024-04-19 DIAGNOSIS — I5032 Chronic diastolic (congestive) heart failure: Secondary | ICD-10-CM | POA: Diagnosis not present

## 2024-04-19 NOTE — Telephone Encounter (Signed)
 Copied from CRM [id number]. Topic: Clinical - Order For Equipment >> Apr 15, 2024  2:30 PM Russell PARAS wrote: Reason for CRM:   Pt is contacting clinic regarding her recent pulse oximetry testing. She is inquiring what DME company the order for oxygen was sent to. Reviewed chart but was unable to find name of company.  Requested call back for status update on order and the name of the DME company  CB#  [phone]    VM/ LM okay per DPR -- DME:  [employer]    -NFN

## 2024-04-19 NOTE — Telephone Encounter (Signed)
 Patient wants a call back directly from C. [name], [place] regarding her upcoming CHF visit.

## 2024-04-20 LAB — BASIC METABOLIC PANEL WITH GFR
BUN/Creatinine Ratio: 22 (ref 12–28)
BUN: 27 mg/dL (ref 8–27)
CO2: 33 mmol/L — ABNORMAL HIGH (ref 20–29)
Calcium: 9.5 mg/dL (ref 8.7–10.3)
Chloride: 94 mmol/L — ABNORMAL LOW (ref 96–106)
Creatinine, Ser: 1.22 mg/dL — ABNORMAL HIGH (ref 0.57–1.00)
Glucose: 96 mg/dL (ref 70–99)
Potassium: 4.8 mmol/L (ref 3.5–5.2)
Sodium: 143 mmol/L (ref 134–144)
eGFR: 45 mL/min/1.73 — ABNORMAL LOW (ref >59)

## 2024-04-20 LAB — PRO B NATRIURETIC PEPTIDE: NT-Pro BNP: 1825 pg/mL — ABNORMAL HIGH (ref 0–738)

## 2024-04-21 ENCOUNTER — Telehealth (HOSPITAL_COMMUNITY): Payer: Self-pay | Admitting: Cardiology

## 2024-04-21 MED ORDER — DIPHENHYDRAMINE HCL 50 MG PO TABS: 50.0000 mg | ORAL_TABLET | ORAL | 0 refills | Status: AC

## 2024-04-21 MED ORDER — PREDNISONE 50 MG PO TABS: 50.0000 mg | ORAL_TABLET | ORAL | 0 refills | Status: DC

## 2024-04-21 NOTE — Telephone Encounter (Signed)
 Instructions sent via mychart Will call patient to review as well

## 2024-04-21 NOTE — Telephone Encounter (Signed)
-  patient called in inquire about upcoming procedure. Reports she was not given any instructions or details    [facility] [address] [place] [address] Dept: [phone] Loc: [phone]  [name]  04/21/2024  You are scheduled for a Cardiac Catheterization on Thursday, October 23 with Dr. Toribio Fuel.  1. Please arrive at the [facility] (Main Entrance A) at [facility]: [address] [place], [place] [address] at 12:00 PM (This time is 1.5 hour(s) before your procedure to ensure your preparation).   Free valet parking service is available. You will check in at ADMITTING. The support person will be asked to wait in the waiting room.  It is OK to have someone drop you off and come back when you are ready to be discharged.    Special note: Every effort is made to have your procedure done on time. Please understand that emergencies sometimes delay scheduled procedures.  2. Diet: Light meals may be eaten up to 6 hours before scheduled procedures from 12N and after; please stop eating at 6:00 AM   Light meal consist of plain toast, fruit, light soups, crackers.   3. Hydration: You need to be well hydrated before your procedure. On October 23, you may drink approved liquids (see below) until 2 hours before the procedure, with 16 oz of water as your last intake.   List of approved liquids water, clear juice, clear tea, black coffee, fruit juices, non-citric and without pulp, carbonated beverages, Gatorade, Kool -Aid, plain Jello-O and plain ice popsicles.  4. Labs: recent labs done 04/19/2024  5. Medication instructions in preparation for your procedure:   Contrast Allergy : Yes, Please take Prednisone  50mg  by mouth at: Thirteen hours prior to cath 12:30am on Thursday Seven hours prior to cath 6:30am on Thursday And prior to leaving home please take last dose of Prednisone  50mg  and  Benadryl  50mg  by mouth. ~1130am    Stop taking Eliquis  (Apixiban) on Tuesday, October 21. Last dose Tuesday evening and none on Wednesday and Thursday  Stop taking, Lasix  (Furosemide )  Thursday, October 23,      On the morning of your procedure, take your  and any morning medicines NOT listed above.  You may use sips of water.  6. Plan to go home the same day, you will only stay overnight if medically necessary. 7. Bring a current list of your medications and current insurance cards. 8. You MUST have a responsible person to drive you home. 9. Someone MUST be with you the first 24 hours after you arrive home or your discharge will be delayed. 10. Please wear clothes that are easy to get on and off and wear slip-on shoes.  Thank you for allowing us  to care for you!   -- [facility]

## 2024-04-22 NOTE — Telephone Encounter (Signed)
 Pt aware.

## 2024-04-23 ENCOUNTER — Ambulatory Visit: Payer: Self-pay | Admitting: Pulmonary Disease

## 2024-04-23 DIAGNOSIS — G4734 Idiopathic sleep related nonobstructive alveolar hypoventilation: Secondary | ICD-10-CM

## 2024-04-23 NOTE — Progress Notes (Signed)
 Patient spent 1hr 5 minutes with an SpO2 less than 88%. She continues to qualify for oxygen at night when sleeping. Recommend she use 2L of oxygen with humidification. Please place home O2 order if patient ok with O2 therapy.

## 2024-04-25 ENCOUNTER — Telehealth: Payer: Self-pay | Admitting: Cardiology

## 2024-04-25 NOTE — Telephone Encounter (Signed)
 Pt is unclear on what medications she should take before her procedure scheduled 10/23. Please advise.

## 2024-04-25 NOTE — Telephone Encounter (Signed)
 Spoke with pt and she stated that she had been confused about how to take her contrast premedication. Explained the premedication instructions to pt. Pt stated she was told my the pharmacy that they only had been sent Prednisone . Explained to pt that both Prednisone  and Benadryl  had been sent to pharmacy on 10/16. Told pt that I would call the pharmacy to make sure they had both rx. Pt pharmacy confirmed they do have both the Prednisone  and Benadryl  rx.

## 2024-04-25 NOTE — Telephone Encounter (Signed)
 Called and spoke with the patient and relayed ONO results. Pt is aware and would like to start O2 therapy at night.  I have placed the order.  Pt was asking questions regarding humidification and how to work it I advised pt to ask DME company as they will explain it to her.  Pt states she is going to be in the hospital Thursday so she will not be able to wear oxygen if it gets delivered by then. Nothing further needed at this time.

## 2024-04-27 ENCOUNTER — Telehealth: Payer: Self-pay | Admitting: Cardiology

## 2024-04-27 ENCOUNTER — Ambulatory Visit: Admitting: Physician Assistant

## 2024-04-27 ENCOUNTER — Telehealth: Payer: Self-pay | Admitting: *Deleted

## 2024-04-27 NOTE — Telephone Encounter (Signed)
 Spoke with patient about heart cath instructions. Reviewed meds to be held, timeliness of pre-meds, hydration and PO intake. Patient very appreciative of call and review of her questions.    Total time: 14 mins

## 2024-04-27 NOTE — Telephone Encounter (Addendum)
 Cardiac Catheterization scheduled at [facility] for: Thursday April 28, 2024 1:30 PM Arrival time [facility] Main Entrance A at: 11:30 AM  Diet: -May have light meal until 7:30 AM. (6 hours before procedure time) Approved light meal consists of plain toast, fruit, light soups, crackers.  Hydration: -May drink clear liquids until 2 hours before the procedure.  Approved liquids: Water, clear tea, black coffee, fruit juices-non-citric and without pulp,Gatorade, plain Jello/popsicles.   -Please drink 8 oz of water 2 hours before procedure.  CONTRAST ALLERGY : 13 hour Prednisone  and Benadryl  Prep: 04/28/24 Prednisone  50 mg 12:30 AM 04/28/24 Prednisone  50 mg 6:30 AM 04/28/24 Prednisone  50 mg and Benadryl  50 mg just prior to leaving home. Pt advised not to drive after taking Benadryl .  Medication instructions: -Hold:  Eliquis -none 04/26/24 until post procedure  Lasix /KCl-AM of procedure -Other usual morning medications can be taken including aspirin  81 mg.  Plan to go home the same day, you will only stay overnight if medically necessary.  You must have responsible adult to drive you home.  Someone must be with you the first 24 hours after you arrive home.  Reviewed procedure instructions with patient.  No pre-procedure hydration per Dr Cherrie.

## 2024-04-27 NOTE — Telephone Encounter (Signed)
 Left message to call back.

## 2024-04-27 NOTE — Telephone Encounter (Signed)
 Patient calling with questions, concerning her procedure on tomorrow. Please advsie

## 2024-04-28 ENCOUNTER — Ambulatory Visit (HOSPITAL_COMMUNITY)
Admission: RE | Admit: 2024-04-28 | Discharge: 2024-04-28 | Disposition: A | Discharge status: Home / Self Care | Attending: Internal Medicine | Admitting: Internal Medicine

## 2024-04-28 ENCOUNTER — Encounter (HOSPITAL_COMMUNITY)
Admission: RE | Disposition: A | Discharge status: Home / Self Care | Payer: Self-pay | Source: Home / Self Care | Attending: Internal Medicine

## 2024-04-28 ENCOUNTER — Other Ambulatory Visit: Payer: Self-pay

## 2024-04-28 DIAGNOSIS — R0609 Other forms of dyspnea: Secondary | ICD-10-CM | POA: Diagnosis not present

## 2024-04-28 DIAGNOSIS — I110 Hypertensive heart disease with heart failure: Secondary | ICD-10-CM | POA: Diagnosis not present

## 2024-04-28 DIAGNOSIS — G4733 Obstructive sleep apnea (adult) (pediatric): Secondary | ICD-10-CM | POA: Insufficient documentation

## 2024-04-28 DIAGNOSIS — I270 Primary pulmonary hypertension: Secondary | ICD-10-CM | POA: Diagnosis not present

## 2024-04-28 DIAGNOSIS — I471 Supraventricular tachycardia, unspecified: Secondary | ICD-10-CM | POA: Diagnosis not present

## 2024-04-28 DIAGNOSIS — I493 Ventricular premature depolarization: Secondary | ICD-10-CM | POA: Diagnosis not present

## 2024-04-28 DIAGNOSIS — Z79899 Other long term (current) drug therapy: Secondary | ICD-10-CM | POA: Diagnosis not present

## 2024-04-28 DIAGNOSIS — E785 Hyperlipidemia, unspecified: Secondary | ICD-10-CM | POA: Insufficient documentation

## 2024-04-28 DIAGNOSIS — I480 Paroxysmal atrial fibrillation: Secondary | ICD-10-CM | POA: Diagnosis not present

## 2024-04-28 DIAGNOSIS — I5032 Chronic diastolic (congestive) heart failure: Secondary | ICD-10-CM | POA: Diagnosis not present

## 2024-04-28 DIAGNOSIS — I340 Nonrheumatic mitral (valve) insufficiency: Secondary | ICD-10-CM | POA: Insufficient documentation

## 2024-04-28 DIAGNOSIS — I2510 Atherosclerotic heart disease of native coronary artery without angina pectoris: Secondary | ICD-10-CM | POA: Insufficient documentation

## 2024-04-28 DIAGNOSIS — I272 Pulmonary hypertension, unspecified: Secondary | ICD-10-CM

## 2024-04-28 DIAGNOSIS — Z7901 Long term (current) use of anticoagulants: Secondary | ICD-10-CM | POA: Insufficient documentation

## 2024-04-28 DIAGNOSIS — Z952 Presence of prosthetic heart valve: Secondary | ICD-10-CM | POA: Diagnosis not present

## 2024-04-28 HISTORY — PX: RIGHT/LEFT HEART CATH AND CORONARY ANGIOGRAPHY: CATH118266

## 2024-04-28 LAB — POCT I-STAT 7, (LYTES, BLD GAS, ICA,H+H)
Acid-Base Excess: 5 mmol/L — ABNORMAL HIGH (ref 0.0–2.0)
Bicarbonate: 30.2 mmol/L — ABNORMAL HIGH (ref 20.0–28.0)
Calcium, Ion: 1.04 mmol/L — ABNORMAL LOW (ref 1.15–1.40)
HCT: 30 % — ABNORMAL LOW (ref 36.0–46.0)
Hemoglobin: 10.2 g/dL — ABNORMAL LOW (ref 12.0–15.0)
O2 Saturation: 91 %
Potassium: 3.8 mmol/L (ref 3.5–5.1)
Sodium: 143 mmol/L (ref 135–145)
TCO2: 32 mmol/L (ref 22–32)
pCO2 arterial: 49.1 mmHg — ABNORMAL HIGH (ref 32–48)
pH, Arterial: 7.398 (ref 7.35–7.45)
pO2, Arterial: 62 mmHg — ABNORMAL LOW (ref 83–108)

## 2024-04-28 LAB — CBC
HCT: 39 % (ref 36.0–46.0)
Hemoglobin: 12.2 g/dL (ref 12.0–15.0)
MCH: 29.5 pg (ref 26.0–34.0)
MCHC: 31.3 g/dL (ref 30.0–36.0)
MCV: 94.2 fL (ref 80.0–100.0)
Platelets: 182 K/uL (ref 150–400)
RBC: 4.14 MIL/uL (ref 3.87–5.11)
RDW: 14.1 % (ref 11.5–15.5)
WBC: 5.9 K/uL (ref 4.0–10.5)
nRBC: 0 % (ref 0.0–0.2)

## 2024-04-28 LAB — POCT I-STAT EG7
Acid-Base Excess: 8 mmol/L — ABNORMAL HIGH (ref 0.0–2.0)
Acid-Base Excess: 7 mmol/L — ABNORMAL HIGH (ref 0.0–2.0)
Bicarbonate: 34.2 mmol/L — ABNORMAL HIGH (ref 20.0–28.0)
Bicarbonate: 33.9 mmol/L — ABNORMAL HIGH (ref 20.0–28.0)
Calcium, Ion: 1.2 mmol/L (ref 1.15–1.40)
Calcium, Ion: 1.18 mmol/L (ref 1.15–1.40)
HCT: 32 % — ABNORMAL LOW (ref 36.0–46.0)
HCT: 32 % — ABNORMAL LOW (ref 36.0–46.0)
Hemoglobin: 10.9 g/dL — ABNORMAL LOW (ref 12.0–15.0)
Hemoglobin: 10.9 g/dL — ABNORMAL LOW (ref 12.0–15.0)
O2 Saturation: 64 %
O2 Saturation: 61 %
Potassium: 4.2 mmol/L (ref 3.5–5.1)
Potassium: 4.1 mmol/L (ref 3.5–5.1)
Sodium: 140 mmol/L (ref 135–145)
Sodium: 140 mmol/L (ref 135–145)
TCO2: 36 mmol/L — ABNORMAL HIGH (ref 22–32)
TCO2: 36 mmol/L — ABNORMAL HIGH (ref 22–32)
pCO2, Ven: 57.4 mmHg (ref 44–60)
pCO2, Ven: 57.6 mmHg (ref 44–60)
pH, Ven: 7.383 (ref 7.25–7.43)
pH, Ven: 7.378 (ref 7.25–7.43)
pO2, Ven: 35 mmHg (ref 32–45)
pO2, Ven: 33 mmHg (ref 32–45)

## 2024-04-28 LAB — BASIC METABOLIC PANEL WITH GFR
Anion gap: 10 (ref 5–15)
BUN: 27 mg/dL — ABNORMAL HIGH (ref 8–23)
CO2: 30 mmol/L (ref 22–32)
Calcium: 8.8 mg/dL — ABNORMAL LOW (ref 8.9–10.3)
Chloride: 96 mmol/L — ABNORMAL LOW (ref 98–111)
Creatinine, Ser: 1.01 mg/dL — ABNORMAL HIGH (ref 0.44–1.00)
GFR, Estimated: 57 mL/min — ABNORMAL LOW (ref >60)
Glucose, Bld: 127 mg/dL — ABNORMAL HIGH (ref 70–99)
Potassium: 4.4 mmol/L (ref 3.5–5.1)
Sodium: 136 mmol/L (ref 135–145)

## 2024-04-28 SURGERY — RIGHT/LEFT HEART CATH AND CORONARY ANGIOGRAPHY
Anesthesia: LOCAL

## 2024-04-28 MED ORDER — SODIUM CHLORIDE 0.9 % IV SOLN: 250.0000 mL | INTRAVENOUS | Status: DC | PRN

## 2024-04-28 MED ORDER — FREE WATER: 250.0000 mL | Freq: Once | Status: DC

## 2024-04-28 MED ORDER — ACETAMINOPHEN 325 MG PO TABS: 650.0000 mg | ORAL_TABLET | ORAL | Status: DC | PRN

## 2024-04-28 MED ORDER — HEPARIN (PORCINE) IN NACL 1000-0.9 UT/500ML-% IV SOLN
INTRAVENOUS | Status: DC | PRN
  Administered 2024-04-28 (×2): 500 mL

## 2024-04-28 MED ORDER — MIDAZOLAM HCL 2 MG/2ML IJ SOLN
INTRAMUSCULAR | Status: AC
  Filled 2024-04-28: qty 2

## 2024-04-28 MED ORDER — FENTANYL CITRATE (PF) 100 MCG/2ML IJ SOLN
INTRAMUSCULAR | Status: DC | PRN
  Administered 2024-04-28: 25 ug via INTRAVENOUS

## 2024-04-28 MED ORDER — ONDANSETRON HCL 4 MG/2ML IJ SOLN: 4.0000 mg | Freq: Four times a day (QID) | INTRAMUSCULAR | Status: DC | PRN

## 2024-04-28 MED ORDER — HEPARIN SODIUM (PORCINE) 1000 UNIT/ML IJ SOLN
INTRAMUSCULAR | Status: AC
  Filled 2024-04-28: qty 10

## 2024-04-28 MED ORDER — MIDAZOLAM HCL (PF) 2 MG/2ML IJ SOLN
INTRAMUSCULAR | Status: DC | PRN
  Administered 2024-04-28 (×2): 1 mg via INTRAVENOUS

## 2024-04-28 MED ORDER — VERAPAMIL HCL 2.5 MG/ML IV SOLN
INTRAVENOUS | Status: AC
  Filled 2024-04-28: qty 2

## 2024-04-28 MED ORDER — LIDOCAINE HCL (PF) 1 % IJ SOLN
INTRAMUSCULAR | Status: DC | PRN
  Administered 2024-04-28 (×2): 2 mL

## 2024-04-28 MED ORDER — IOHEXOL 350 MG/ML SOLN
INTRAVENOUS | Status: DC | PRN
  Administered 2024-04-28: 30 mL

## 2024-04-28 MED ORDER — LABETALOL HCL 5 MG/ML IV SOLN: 10.0000 mg | INTRAVENOUS | Status: DC | PRN

## 2024-04-28 MED ORDER — HEPARIN SODIUM (PORCINE) 1000 UNIT/ML IJ SOLN
INTRAMUSCULAR | Status: DC | PRN
  Administered 2024-04-28: 2500 [IU] via INTRAVENOUS

## 2024-04-28 MED ORDER — PREDNISONE 20 MG PO TABS: 50.0000 mg | ORAL_TABLET | Freq: Once | ORAL | Status: DC

## 2024-04-28 MED ORDER — HYDRALAZINE HCL 20 MG/ML IJ SOLN: 10.0000 mg | INTRAMUSCULAR | Status: DC | PRN

## 2024-04-28 MED ORDER — VERAPAMIL HCL 2.5 MG/ML IV SOLN
INTRAVENOUS | Status: DC | PRN
  Administered 2024-04-28: 10 mL via INTRA_ARTERIAL

## 2024-04-28 MED ORDER — SODIUM CHLORIDE 0.9% FLUSH: 3.0000 mL | INTRAVENOUS | Status: DC | PRN

## 2024-04-28 MED ORDER — FENTANYL CITRATE (PF) 100 MCG/2ML IJ SOLN
INTRAMUSCULAR | Status: AC
  Filled 2024-04-28: qty 2

## 2024-04-28 MED ORDER — SODIUM CHLORIDE 0.9% FLUSH: 3.0000 mL | Freq: Two times a day (BID) | INTRAVENOUS | Status: DC

## 2024-04-28 MED ORDER — LIDOCAINE HCL (PF) 1 % IJ SOLN
INTRAMUSCULAR | Status: AC
  Filled 2024-04-28: qty 30

## 2024-04-28 SURGICAL SUPPLY — 10 items
CATH 5FR JL3.5 JR4 ANG PIG MP (CATHETERS) IMPLANT
CATH BALLN WEDGE 5F 110CM (CATHETERS) IMPLANT
CATH SWAN GANZ 7F STRAIGHT (CATHETERS) IMPLANT
DEVICE RAD TR BAND REGULAR (VASCULAR PRODUCTS) IMPLANT
GLIDESHEATH SLEND SS 6F .021 (SHEATH) IMPLANT
GLIDESHEATH SLENDER 7FR .021G (SHEATH) IMPLANT
GUIDEWIRE .025 260CM (WIRE) IMPLANT
GUIDEWIRE INQWIRE 1.5J.035X260 (WIRE) IMPLANT
PACK CARDIAC CATHETERIZATION (CUSTOM PROCEDURE TRAY) ×1 IMPLANT
SHEATH GLIDE SLENDER 4/5FR (SHEATH) IMPLANT

## 2024-04-28 NOTE — Interval H&P Note (Signed)
 History and Physical Interval Note:  04/28/2024 11:28 AM  [name]  has presented today for surgery, with the diagnosis of heart failure.  The various methods of treatment have been discussed with the patient and family. After consideration of risks, benefits and other options for treatment, the patient has consented to  Procedure(s): RIGHT/LEFT HEART CATH AND CORONARY ANGIOGRAPHY (N/A) as a surgical intervention.  The patient's history has been reviewed, patient examined, no change in status, stable for surgery.  I have reviewed the patient's chart and labs.  Questions were answered to the patient's satisfaction.     Vi Whitesel

## 2024-04-29 ENCOUNTER — Encounter (HOSPITAL_COMMUNITY): Payer: Self-pay | Admitting: Internal Medicine

## 2024-05-02 ENCOUNTER — Telehealth: Payer: Self-pay | Admitting: Cardiology

## 2024-05-02 ENCOUNTER — Other Ambulatory Visit: Payer: Self-pay | Admitting: Pulmonary Disease

## 2024-05-02 NOTE — Telephone Encounter (Signed)
 She had right and left heart cath on 04/28/24- S/w the patient and she states that she feels like she has flu- like- symptoms= nausea, fatigue, headache. She states it is getting better, though.  She reports that she has a contrast allergy  and she had to take pre-meds. She thinks that she is still reacting to the contrast and all of the pre-meds she had to take before the heart cath. She states that her body is very sensitive to medications. She just wanted to let us  know. I reported that we still have contrast as an allergy  on her list.   She denies any difficulty breathing, redness, or rash. Advised her to push fluids/water to help to get the contrast and medication out of her body quicker. Also informed her that it can take up to a week for all symptoms to resolve. She has an appointment with her Pulmonologist tomorrow.  Instructed her to call us  with any further questions/concerns.  Given ER precautions. She verbalized understanding.

## 2024-05-02 NOTE — Telephone Encounter (Signed)
 Patient just had an ablation but she states that she is feeling like she has the flu. Calling to see what she needs to do. Please advise

## 2024-05-03 ENCOUNTER — Encounter: Payer: Self-pay | Admitting: Adult Health

## 2024-05-03 ENCOUNTER — Ambulatory Visit (INDEPENDENT_AMBULATORY_CARE_PROVIDER_SITE_OTHER): Admitting: Adult Health

## 2024-05-03 VITALS — BP 118/68 | HR 78 | Temp 97.6°F | Ht 66.0 in | Wt 113.4 lb

## 2024-05-03 DIAGNOSIS — J479 Bronchiectasis, uncomplicated: Secondary | ICD-10-CM | POA: Diagnosis not present

## 2024-05-03 DIAGNOSIS — G4734 Idiopathic sleep related nonobstructive alveolar hypoventilation: Secondary | ICD-10-CM

## 2024-05-03 DIAGNOSIS — R0609 Other forms of dyspnea: Secondary | ICD-10-CM

## 2024-05-03 DIAGNOSIS — E43 Unspecified severe protein-calorie malnutrition: Secondary | ICD-10-CM | POA: Diagnosis not present

## 2024-05-03 DIAGNOSIS — R5381 Other malaise: Secondary | ICD-10-CM | POA: Diagnosis not present

## 2024-05-03 NOTE — Patient Instructions (Addendum)
 Hold Yupelri  for now.  Begin Flutter valve daily  Use Xopenex  inhaler As needed   Begin Oxygen 2l/m  At bedtime  Follow up with 3 months with Dr. Kara with PFT and As needed

## 2024-05-03 NOTE — Progress Notes (Signed)
 @Patient  ID: [name], female    DOB: 06-10-1946, 78 y.o.   MRN: [id number]  Chief Complaint  Patient presents with   Follow-up    sob    Referring provider: [name], MD  HPI: 78 year old female former smoker followed for Bronchiectasis and OSA -(declines CPAP ) , nocturnal hypoxemia Medical history significant for mitral valve regurg status post mitral valve repair and maze procedure complicated by left ventricle free wall rupture April 2021.  Medical history significant for scoliosis status post multiple spinal surgeries. Atrial Fib    TEST/EVENTS : Reviewed 05/03/2024  CT chest August 2023 right middle lobe and left upper lobe tree-in-bud nodularity, 6 mm left major fissure subpleural nodule, 6 chronic left upper lobe bronchiectasis with mucous plugging PFT 12/2020 Moderate restriction with + BD response  Chest x-ray Nov 25, 2023 hyperinflated lungs without acute process   Discussed the use of AI scribe software for clinical note transcription with the patient, who gave verbal consent to proceed.  History of Present Illness [name] is a 78 year old female with bronchiectasis who presents for 39-month follow-up for bronchiectasis.   Overall says her breathing is doing about the same.  She experiences minimal cough or congestion.  She has Yupelri  nebulizer but does not use it on a consistent basis says it it does cause her nausea and stomach upset. She typically uses it late in the day and is uncertain of its benefits.  Says that she uses Xopenex  which she finds more beneficial.  Says she uses it a couple times a week. She experiences shortness of breath, particularly when walking or shopping, requiring her to use a shopping cart for support.  Previous pulmonary function testing has showed moderate restriction with bronchodilator response.  She has undergone multiple back surgeries, including scoliosis correction.   She has a history of sleep apnea but says she  cannot wear CPAP.  Recent overnight oximetry test showed nocturnal hypoxemia and was recommended for 2 L of oxygen.  She says that the DME company will be delivering this next week as she postponed this due to some issues at home.     She has atrial fibrillation and is on amiodarone  and Eliquis .  2D echo April 13, 2024 showed EF of 55 to 60%, moderately reduced right ventricular systolic function, severely elevated pulmonary artery systolic pressure at 75.5 mmHg.  And severely dilated right atrium.  She recently underwent a right and left cardiac catheterization due to concerns about elevated right heart pressures, which required premedication due to her contrast allergy .  This showed EF at 60 to 65%, minimal nonobstructive coronary artery disease and mild pulmonary artery hypertension with normal left sided filling pressures.  Prominent V-waves in the PCWP suggestive of significant MR.        Allergies  Allergen Reactions   Buprenorphine Nausea Only and Other (See Comments)    sedation and adhesive reaction--patches    Erythromycin Nausea And Vomiting and Other (See Comments)    Dizziness     Iodinated Contrast Media Hives, Nausea Only and Rash   Latex Palpitations, Rash and Other (See Comments)    Hives/sores Gloves   Other Other (See Comments)    CAN NOT TAKE , aLPRAZOLAM, OR ELAVIL DUE TO AFIB FOR ANXIETY   Steroids-jittery/hyper wants to crawl out of skin   Tape Hives, Itching, Rash and Other (See Comments)    Heart monitor stickers must be rotated in order to prevent rash Adhesive on  EKG tabs=BURNS SKIN**PAPER TAPE OK** sores     Ciprofloxacin  Other (See Comments)    Burning all over   Duloxetine  Hcl Other (See Comments)    Severe diarrhea and upset stomach   Gabapentin  Other (See Comments)    Unknown--unsteady gait/felt intoxicated   Hydrocodone Other (See Comments)    Sedation,dizziness, and nausea   Hydromorphone  Nausea Only and Other (See Comments)     Sedation,dizziness, and nausea   Iodine Hives   Levofloxacin Nausea And Vomiting and Nausea Only   Metrizamide Hives   Nsaids     CANNOT TAKE PER CARDIOLOGIST DUE TO AFIB    Nucynta [Tapentadol Hcl] Nausea And Vomiting and Other (See Comments)    nausea and sedation   Oxycodone  Itching, Nausea Only and Other (See Comments)    Delusions (intolerance)  PILLS ONLY sedation, dizziness, nausea,  and itching   Pentazocine Other (See Comments)    Unknown    Pregabalin Nausea Only    Unknown    Sulfasalazine Hives   Morphine Nausea Only, Anxiety and Other (See Comments)    sedation and nausea delusions (intolerance) PT CANNOT WAKE UP AFTER TAKING MEDICATION AND HAS NIGHTMARES     Sulfa Antibiotics Hives, Nausea Only and Rash   Sulfamethoxazole-Trimethoprim Hives and Rash    Immunization History  Administered Date(s) Administered   DTaP 07/07/2009   INFLUENZA, HIGH DOSE SEASONAL PF 05/02/2016   Influenza Inj Mdck Quad Pf 03/25/2019, 04/19/2021, 04/25/2022   Influenza Split 03/07/2012, 05/08/2018   Influenza, Seasonal, Injecte, Preservative Fre 05/08/2018, 03/25/2019, 04/18/2023   Influenza,inj,Quad PF,6+ Mos 06/08/2013, 04/06/2015, 05/02/2017, 05/08/2018   Influenza-Unspecified 03/07/2012, 06/08/2013, 04/06/2015, 05/02/2016, 05/02/2017, 05/08/2018, 04/26/2021   PFIZER(Purple Top)SARS-COV-2 Vaccination 08/11/2019, 09/01/2019   Pneumococcal Conjugate-13 04/19/2015   Pneumococcal Polysaccharide-23 08/07/2009, 12/27/2013   Pneumococcal-Unspecified 04/19/2015   Tdap 08/02/2020   Zoster Recombinant(Shingrix) 07/08/2010   Zoster, Live 07/07/2010    Past Medical History:  Diagnosis Date   Allergies    Anxiety    Arthritis    maybe in my back (03/31/2018)   Benign paroxysmal positional vertigo 06/08/2013   Complication of anesthesia    Fracture of multiple ribs 2015   don't know from what; dx'd when I in hospital for 1st back OR (03/31/2018)   GERD (gastroesophageal reflux  disease)    Hair loss 04/12/2012   Herpes    History of blood transfusion    twice; related to back OR (03/31/2018)   History of kidney stones    Interstitial cystitis 11/06/2011   Melanoma of ankle (HCC) ~ 2003   right   Mitral regurgitation    Osteopenia 02/18/2012   Osteoporosis    PAF (paroxysmal atrial fibrillation) (HCC) 2012   Peripheral neuropathy 11/06/2011   PMDD (premenstrual dysphoric disorder)    PONV (postoperative nausea and vomiting)    nausea, vomiting, hives and dizziness    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/2020   Complete bilateral atrial lesion set using cryothermy and bipolar radiofrequency ablation with clipping of LA appendage via right mini-thoracotomy approach   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   Complex valvuloplasty including artificial Gore-tex neochord placement x12 with 32mm Sorin Memo 4D ring annuloplasty   Seasonal allergies    Vaginal delivery    ONE NSVD   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     amiodarone  (PACERONE ) 200 MG tablet Take 200 mg by mouth daily.     apixaban  (ELIQUIS ) 2.5 MG TABS tablet Take 1 tablet (2.5 mg total) by mouth 2 (two) times daily. 60 tablet 11   Calcium  Carbonate Antacid (TUMS CHEWY BITES PO) Take 1 tablet by mouth daily as needed (reflux).      Calcium -Cholecalciferol-Zinc (VIACTIV CALCIUM  IMMUNE) 650-20-5.5 MG-MCG-MG  CHEW Chew 1 tablet by mouth daily.     Carboxymethylcellul-Glycerin  (LUBRICATING EYE DROPS OP) Place 1 drop into both eyes 3 (three) times daily as needed (dry/irritated eys.).     cephALEXin  (KEFLEX ) 500 MG capsule Rake all 4 tablets 1 hour prior to dental procedure 4 capsule 6   Cholecalciferol (VITAMIN D3) 50 MCG (2000 UT) capsule Take 2,000 Units by mouth daily.     clonazePAM  (KLONOPIN ) 0.5 MG tablet Take 0.5 tablets (0.25 mg total) by mouth 2 (two) times daily as needed for anxiety. 30 tablet 3   cyanocobalamin  (VITAMIN B12) 1000 MCG/ML injection Inject 1,000 mcg into the muscle every 30 (thirty) days.     denosumab  (PROLIA ) 60 MG/ML SOSY injection Inject 60 mg into the skin every 6 (six) months.     diphenhydrAMINE  (BENADRYL ) 50 MG tablet Take 1 tablet (50 mg total) by mouth as directed. 1 tablet 0   furosemide  (LASIX ) 40 MG tablet Take 1.5 tablets (60 mg total) by mouth daily. 45 tablet 2   levalbuterol  (XOPENEX  HFA) 45 MCG/ACT inhaler Inhale 2 puffs into the lungs every 4 (four) hours as needed for wheezing. 1 each 6   levothyroxine  (SYNTHROID ) 75 MCG tablet Take 1 tablet (75 mcg total) by mouth daily. 90 tablet 0   loratadine (CLARITIN) 10 MG tablet Take 10 mg by mouth in the morning.     meclizine  (ANTIVERT ) 25 MG tablet TAKE 1 TABLET BY MOUTH THREE TIMES DAILY AS NEEDED FOR DIZZINESS 45 tablet 0   metoprolol  tartrate (LOPRESSOR ) 25 MG tablet Take 1 tablet by mouth twice daily 180 tablet 3   nystatin  (MYCOSTATIN /NYSTOP ) powder Apply topically 2 (two) times daily as needed (skin irritation (under breasts)). 15 g 0   ondansetron  (ZOFRAN ) 8 MG tablet Take 1 tablet (8 mg total) by mouth every 8 (eight) hours as needed for nausea or vomiting. 45 tablet 1   ondansetron  (ZOFRAN -ODT) 4 MG disintegrating tablet Take 1 tablet (4 mg total) by mouth every 8 (eight) hours as needed for nausea or vomiting. 20 tablet 0   oxyCODONE  (ROXICODONE ) 5 MG/5ML solution Take 4 mLs (4 mg total) by mouth every 4  (four) hours as needed for moderate pain.  0   pantoprazole  (PROTONIX ) 40 MG tablet Take 1 tablet by mouth once daily 30 tablet 0   polyethylene glycol (MIRALAX  / GLYCOLAX ) packet Take 17 g by mouth daily as needed for mild constipation.     potassium chloride  SA (KLOR-CON  M) 20 MEQ tablet Take 1 tablet (20 mEq total) by mouth daily. 30 tablet 2   revefenacin  (YUPELRI ) 175 MCG/3ML nebulizer solution Inhale one vial in nebulizer once daily. Do not  mix with other nebulized medications. 3 mL 11   sodium chloride  (OCEAN) 0.65 % SOLN nasal spray Place 1 spray into both nostrils as needed for congestion (nose bleeds).     valACYclovir  (VALTREX ) 500 MG tablet Take 500 mg by mouth daily as needed (breakouts).     Naloxone HCl 0.4 MG/ML SOCT once as needed. (Patient not taking: Reported on 05/03/2024)     predniSONE  (DELTASONE ) 50 MG tablet Take 1 tablet (50 mg total) by mouth as directed. (Patient not taking: Reported on 05/03/2024) 3 tablet 0   No facility-administered medications prior to visit.     Review of Systems:   Constitutional:   No  weight loss, night sweats,  Fevers, chills,+ fatigue, or  lassitude.  HEENT:   No headaches,  Difficulty swallowing,  Tooth/dental problems, or  Sore throat,                No sneezing, itching, ear ache, nasal congestion, post nasal drip,   CV:  No chest pain,  Orthopnea, PND, swelling in lower extremities, anasarca, dizziness, palpitations, syncope.   GI  No heartburn, indigestion, abdominal pain, nausea, vomiting, diarrhea, change in bowel habits, loss of appetite, bloody stools.   Resp:   No chest wall deformity  Skin: no rash or lesions.  GU: no dysuria, change in color of urine, no urgency or frequency.  No flank pain, no hematuria   MS:  No joint pain or swelling.  No decreased range of motion.  No back pain.    Physical Exam  BP 118/68   Pulse 78   Temp 97.6 F (36.4 C)   Ht 5' 6 (1.676 m) Comment: Per pt  Wt 113 lb 6.4 oz (51.4 kg)    SpO2 94% Comment: RA  BMI 18.30 kg/m   GEN: A/Ox3; pleasant , NAD, thin, elderly, frail   HEENT:  [place]/AT,  EACs-clear, TMs-wnl, NOSE-clear, THROAT-clear, no lesions, no postnasal drip or exudate noted.   NECK:  Supple w/ fair ROM; no JVD; normal carotid impulses w/o bruits; no thyromegaly or nodules palpated; no lymphadenopathy.    RESP  Clear  P & A; w/o, wheezes/ rales/ or rhonchi. no accessory muscle use, no dullness to percussion  CARD:  RRR, Gr 1 SM , no peripheral edema, pulses intact, no cyanosis or clubbing.  GI:   Soft & nt; nml bowel sounds; no organomegaly or masses detected.   Musco: Warm bil, no deformities or joint swelling noted.   Neuro: alert, no focal deficits noted.    Skin: Warm, no lesions or rashes    Lab Results:Reviewed 05/03/2024   CBC    Component Value Date/Time   WBC 5.9 04/28/2024 1019   RBC 4.14 04/28/2024 1019   HGB 10.9 (L) 04/28/2024 1151   HGB 13.1 10/21/2023 1617   HCT 32.0 (L) 04/28/2024 1151   HCT 39.7 10/21/2023 1617   PLT 182 04/28/2024 1019   PLT 197 10/21/2023 1617   MCV 94.2 04/28/2024 1019   MCV 91 10/21/2023 1617   MCH 29.5 04/28/2024 1019   MCHC 31.3 04/28/2024 1019   RDW 14.1 04/28/2024 1019   RDW 13.1 10/21/2023 1617   LYMPHSABS 0.9 03/25/2024 1136   MONOABS 0.6 03/25/2024 1136   EOSABS 0.3 03/25/2024 1136   BASOSABS 0.0 03/25/2024 1136    BMET    Component Value Date/Time   NA 140 04/28/2024 1151   NA 143 04/19/2024 1529   K 4.2 04/28/2024 1151   CL 96 (L) 04/28/2024  1019   CO2 30 04/28/2024 1019   GLUCOSE 127 (H) 04/28/2024 1019   BUN 27 (H) 04/28/2024 1019   BUN 27 04/19/2024 1529   CREATININE 1.01 (H) 04/28/2024 1019   CREATININE 0.79 07/24/2023 1515   CALCIUM  8.8 (L) 04/28/2024 1019   GFRNONAA 57 (L) 04/28/2024 1019   GFRNONAA 89 09/08/2019 1550   GFRAA >60 02/06/2020 0554   GFRAA 103 09/08/2019 1550    BNP    Component Value Date/Time   BNP 253.3 (H) 01/22/2023 0000   BNP 72.6 08/07/2014  0830    ProBNP    Component Value Date/Time   PROBNP 1,825 (H) 04/19/2024 1529   PROBNP 296.0 (H) 11/25/2023 1628    Imaging: CARDIAC CATHETERIZATION Result Date: 04/28/2024   Prox RCA lesion is 20% stenosed. Findings: Ao = 114/49 (75) LV = 114/10 RA = 9 RV = 43/13 PA = 42/10 (24) PCW = 16 (v wave 32) Fick cardiac output/index = 4.4/2.9 PVR = 1.35 WU Ao sat = 91% PA sat = 62%, 65% PAPi = 3.3 MV gradient: Mean 11 MVA 1.35cm2 Assessment: 1. LVEF normal 60-65% 2. Minimal non-obstructive CAD 3. Mild PAH with normal left-sided filling pressures 4. Prominent v-waves in PCWP tracing suggestive of significant MR versus diastolic dysfunction. 5. Simultaneous LV-wedge tracings suggest moderate MS Plan/Discussion: Suspect dyspnea is multifactorial. Consider TEE to further evaluate MVR Toribio Fuel, MD 12:28 PM  ECHOCARDIOGRAM COMPLETE Result Date: 04/13/2024    ECHOCARDIOGRAM REPORT   Patient Name:   DELAYNA SPARLIN Date of Exam: 04/13/2024 Medical Rec #:  [id number]   Height:       66.0 in Accession #:    [id number]  Weight:       109.0 lb Date of Birth:  1945-10-08   BSA:          1.545 m Patient Age:    78 years    BP:           135/63 mmHg Patient Gender: F           HR:           54 bpm. Exam Location:  Outpatient Procedure: 2D Echo, Color Doppler and Cardiac Doppler (Both Spectral and Color            Flow Doppler were utilized during procedure). Indications:    SOB  History:        Patient has prior history of Echocardiogram examinations, most                 recent 01/27/2023. S/p MV repair, procedure date: 02/04/20., s/p                 MAZE procedure, Mitral Valve Disease, Arrythmias:Atrial                 Fibrillation, Signs/Symptoms:Shortness of Breath and Chest Pain;                 Risk Factors:Former Smoker and Hypertension.  Sonographer:    Koleen Popper RDCS Referring Phys: [id number] CALLIE E GOODRICH IMPRESSIONS  1. Left ventricular ejection fraction, by estimation, is 55 to 60%. The left ventricle  has normal function. The left ventricle has no regional wall motion abnormalities. Left ventricular diastolic parameters are indeterminate. There is the interventricular septum is flattened in systole and diastole, consistent with right ventricular pressure and volume overload.  2. Right ventricular systolic function is moderately reduced. The right ventricular size is moderately enlarged. There is severely elevated pulmonary  artery systolic pressure. The estimated right ventricular systolic pressure is 75.5 mmHg.  3. Right atrial size was severely dilated.  4. The mitral valve has been repaired/replaced. Trivial mitral valve regurgitation. No evidence of mitral stenosis. The mean mitral valve gradient is 2.0 mmHg. Echo findings are consistent with normal structure and function of the mitral valve prosthesis.  5. The tricuspid valve is abnormal. Tricuspid valve regurgitation is severe.  6. The aortic valve is normal in structure. There is mild calcification of the aortic valve. Aortic valve regurgitation is mild. No aortic stenosis is present.  7. 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  8. The inferior vena cava is dilated in size with <50% respiratory variability, suggesting right atrial pressure of 15 mmHg. FINDINGS  Left Ventricle: Left ventricular ejection fraction, by estimation, is 55 to 60%. The left ventricle has normal function. The left ventricle has no regional wall motion abnormalities. The left ventricular internal cavity size was normal in size. There is  no left ventricular hypertrophy. The interventricular septum is flattened in systole and diastole, consistent with right ventricular pressure and volume overload. Left ventricular diastolic parameters are indeterminate. Right Ventricle: The right ventricular size is moderately enlarged. No increase in right ventricular wall thickness. Right ventricular systolic function is moderately reduced. There is severely elevated pulmonary artery systolic  pressure. The tricuspid regurgitant velocity is 3.89 m/s, and with an assumed right atrial pressure of 15 mmHg, the estimated right ventricular systolic pressure is 75.5 mmHg. Left Atrium: Left atrial size was normal in size. Right Atrium: Right atrial size was severely dilated. Pericardium: There is no evidence of pericardial effusion. Mitral Valve: The mitral valve has been repaired/replaced. Trivial mitral valve regurgitation. There is a prosthetic annuloplasty ring present in the mitral position. Echo findings are consistent with normal structure and function of the mitral valve prosthesis. No evidence of mitral valve stenosis. MV peak gradient, 9.4 mmHg. The mean mitral valve gradient is 2.0 mmHg. Tricuspid Valve: The tricuspid valve is abnormal. Tricuspid valve regurgitation is severe. No evidence of tricuspid stenosis. Aortic Valve: The aortic valve is normal in structure. There is mild calcification of the aortic valve. Aortic valve regurgitation is mild. No aortic stenosis is present. Pulmonic Valve: The pulmonic valve was normal in structure. 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 No evidence of pulmonic stenosis. Aorta: The aortic root is normal in size and structure. Venous: The inferior vena cava is dilated in size with less than 50% respiratory variability, suggesting right atrial pressure of 15 mmHg. IAS/Shunts: No atrial level shunt detected by color flow Doppler.  LEFT VENTRICLE PLAX 2D LVIDd:         3.60 cm LVIDs:         2.80 cm LV PW:         0.80 cm LV IVS:        1.00 cm LVOT diam:     1.70 cm LV SV:         38 LV SV Index:   24 LVOT Area:     2.27 cm  RIGHT VENTRICLE             IVC RV S prime:     10.40 cm/s  IVC diam: 2.90 cm TAPSE (M-mode): 1.9 cm LEFT ATRIUM           Index        RIGHT ATRIUM           Index LA diam:      3.30 cm 2.14 cm/m  RA Area:     19.20 cm LA Vol (A4C): 22.3 ml 14.43 ml/m  RA Volume:   56.50 ml  36.57 ml/m  AORTIC VALVE LVOT Vmax:   81.20 cm/s LVOT Vmean:   47.200 cm/s LVOT VTI:    0.166 m  AORTA Ao Root diam: 3.20 cm MITRAL VALVE             TRICUSPID VALVE MV Area VTI:  1.15 cm   TR Peak grad:   60.5 mmHg MV Peak grad: 9.4 mmHg   TR Vmax:        389.00 cm/s MV Mean grad: 2.0 mmHg MV Vmax:      1.53 m/s   SHUNTS MV Vmean:     54.1 cm/s  Systemic VTI:  0.17 m                          Systemic Diam: 1.70 cm Toribio Fuel MD Electronically signed by Toribio Fuel MD Signature Date/Time: 04/13/2024/10:47:09 PM    Final     Administration History     None          Latest Ref Rng & Units 01/01/2021   11:49 AM 12/01/2019    9:39 AM  PFT Results  FVC-Pre L 1.55  2.13   FVC-Predicted Pre % 51  69   FVC-Post L 1.67    FVC-Predicted Post % 55    Pre FEV1/FVC % % 74  74   Post FEV1/FCV % % 81    FEV1-Pre L 1.15  1.57   FEV1-Predicted Pre % 50  68   FEV1-Post L 1.36    DLCO uncorrected ml/min/mmHg 21.48  14.09   DLCO UNC% % 105  68   DLCO corrected ml/min/mmHg 22.12  14.56   DLCO COR %Predicted % 108  71   DLVA Predicted % 110  105   TLC L 4.50  5.36   TLC % Predicted % 84  100   RV % Predicted % 131  140     No results found for: NITRICOXIDE      No data to display              Assessment & Plan:   Assessment and Plan Assessment & Plan Bronchiectasis with restrictive lung disease   Mild bronchiectasis with no significant cough or congestion. Scoliosis and multiple back surgeries may contribute to restrictive lung disease. She reports shortness of breath, especially when walking. A deconditioned state and muscle weakness from back issues and heart problems may worsen symptoms. Will hold Yupelri  for now due to adverse effects and lack of  perceived benefit.  Provide a flutter valve and instruct on its use once daily. Use Xopenex  inhaler as needed for dyspnea. Check PFT on return , Consider HRCT chest on return.    Obstructive sleep apnea  -untreated. Declines CPAP  She has moderate to severe obstructive sleep apnea with  nocturnal hypoxemia and declined CPAP therapy.   Nocturnal Hypoxemia- significant nocturnal hypoxemia  Patient is to begin oxygen therapy  Begin oxygen therapy at 2 liters per minute at bedtime.  Atrial fibrillation   Atrial fibrillation is managed with amiodarone  and Eliquis .   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  Begin Flutter valve daily  Use Xopenex  inhaler As needed   Begin Oxygen 2l/m  At bedtime  Follow up with 3 months with Dr. Kara with PFT and As needed            Madelin Stank, NP 05/03/2024  I spent  36  minutes dedicated to the care of this patient on the date of this encounter to include pre-visit review of records, face-to-face time with the patient discussing conditions above, post visit ordering of testing, clinical documentation with the electronic health record, making appropriate referrals as documented, and communicating necessary findings to members of the patients care team.

## 2024-05-05 ENCOUNTER — Encounter: Payer: Self-pay | Admitting: Licensed Clinical Social Worker

## 2024-05-05 ENCOUNTER — Telehealth: Payer: Self-pay | Admitting: Licensed Clinical Social Worker

## 2024-05-05 NOTE — Patient Instructions (Signed)
 [name] Brought - I am sorry I was unable to reach you today for our scheduled appointment. I work with [name], MD and am calling to support your healthcare needs. Please contact me at [phone] at your earliest convenience. I look forward to speaking with you soon.   Thank you,  Lyle Rung, BSW, MSW, LCSW Licensed Clinical Social Worker [employer]   [email or web address] Direct Dial: [phone]

## 2024-05-06 ENCOUNTER — Other Ambulatory Visit: Payer: Self-pay

## 2024-05-09 ENCOUNTER — Ambulatory Visit: Payer: Self-pay | Admitting: Student

## 2024-05-10 ENCOUNTER — Telehealth: Payer: Self-pay | Admitting: Cardiology

## 2024-05-10 NOTE — Telephone Encounter (Signed)
 I am going to talk with Dr. Pietro when I see him in clinic later this week on whether he feels like a TEE is necessary because she has multiple reasons to be short of breath. Filling pressures on cath looked okay so I would not increase her diuretics. She is also scheduled to see Dr. Cherrie next week. So I don't think there is anything else we need to do right now and I will see what Dr. Pietro says about the TEE.   Thanks! Kolbe Delmonaco

## 2024-05-10 NOTE — Telephone Encounter (Signed)
 Pt c/o Shortness Of Breath: STAT if SOB developed within the last 24 hours or pt is noticeably SOB on the phone  1. Are you currently SOB (can you hear that pt is SOB on the phone)?   No  2. How long have you been experiencing SOB?   Ongoing for several months  3. Are you SOB when sitting or when up moving around?   When up and moving around.  4. Are you currently experiencing any other symptoms?   Patient stated her breathing has been getting worse.  Patient has been taking oxygen which patient states helps when she is lying down.

## 2024-05-13 DIAGNOSIS — R040 Epistaxis: Secondary | ICD-10-CM | POA: Diagnosis not present

## 2024-05-13 DIAGNOSIS — H6123 Impacted cerumen, bilateral: Secondary | ICD-10-CM | POA: Diagnosis not present

## 2024-05-13 DIAGNOSIS — H903 Sensorineural hearing loss, bilateral: Secondary | ICD-10-CM | POA: Diagnosis not present

## 2024-05-13 DIAGNOSIS — R2689 Other abnormalities of gait and mobility: Secondary | ICD-10-CM | POA: Diagnosis not present

## 2024-05-13 NOTE — Telephone Encounter (Signed)
 Per callie goodrich, The plan is to hold off on TEE for now. And let her see Dr. Cherrie next week. She also has a follow-up with Katlyn West on 05/27/2024 so we can rediscuss things then depending on what Dr. Cherrie says.   Spoke with [name], aware of above.

## 2024-05-15 ENCOUNTER — Other Ambulatory Visit: Payer: Self-pay | Admitting: Adult Health

## 2024-05-16 ENCOUNTER — Telehealth: Payer: Self-pay

## 2024-05-16 NOTE — Telephone Encounter (Signed)
 Spoke w/ [facility] that the [facility] is on hold as of 05/03/24

## 2024-05-17 ENCOUNTER — Telehealth: Payer: Self-pay

## 2024-05-17 NOTE — Progress Notes (Signed)
 Complex Care Management Care Guide Note  05/17/2024 Name: BLUMA BURESH MRN: [id number] DOB: [birth year]  CHRISELDA LEPPERT is a 78 y.o. year old female who is a primary care patient of Mahlon, Katherine E, MD and is actively engaged with the care management team. I reached out to Odetta JINNY Brought by phone today to assist with re-scheduling  with the Licensed Clinical Child Psychotherapist.  Follow up plan: Telephone appointment with complex care management team member scheduled for:  05/23/24 at 12:30pm  .Debbe Fuse [facility], [facility] Guide  Direct Dial: [phone]  Fax [phone]

## 2024-05-19 ENCOUNTER — Telehealth (HOSPITAL_COMMUNITY): Payer: Self-pay | Admitting: Internal Medicine

## 2024-05-19 NOTE — Telephone Encounter (Signed)
 Called to confirm/remind patient of their appointment at the [facility] on 05/19/24.   Appointment:   [x] Confirmed  [] Left mess   [] No answer/No voice mail  [] VM Full/unable to leave message  [] Phone not in service  Patient reminded to bring all medications and/or complete list.  Confirmed patient has transportation. Gave directions, instructed to utilize valet parking.

## 2024-05-20 ENCOUNTER — Encounter (HOSPITAL_COMMUNITY): Payer: Self-pay | Admitting: Internal Medicine

## 2024-05-20 ENCOUNTER — Ambulatory Visit (HOSPITAL_COMMUNITY)
Admission: RE | Admit: 2024-05-20 | Discharge: 2024-05-20 | Disposition: A | Discharge status: Home / Self Care | Source: Ambulatory Visit | Attending: Internal Medicine | Admitting: Internal Medicine

## 2024-05-20 VITALS — BP 110/64 | HR 63 | Ht 66.0 in | Wt 109.6 lb

## 2024-05-20 DIAGNOSIS — I272 Pulmonary hypertension, unspecified: Secondary | ICD-10-CM

## 2024-05-20 DIAGNOSIS — R0609 Other forms of dyspnea: Secondary | ICD-10-CM

## 2024-05-20 DIAGNOSIS — Z79899 Other long term (current) drug therapy: Secondary | ICD-10-CM | POA: Insufficient documentation

## 2024-05-20 DIAGNOSIS — I2510 Atherosclerotic heart disease of native coronary artery without angina pectoris: Secondary | ICD-10-CM | POA: Diagnosis not present

## 2024-05-20 DIAGNOSIS — I480 Paroxysmal atrial fibrillation: Secondary | ICD-10-CM | POA: Diagnosis not present

## 2024-05-20 DIAGNOSIS — F419 Anxiety disorder, unspecified: Secondary | ICD-10-CM | POA: Diagnosis not present

## 2024-05-20 DIAGNOSIS — I5032 Chronic diastolic (congestive) heart failure: Secondary | ICD-10-CM | POA: Diagnosis not present

## 2024-05-20 DIAGNOSIS — I110 Hypertensive heart disease with heart failure: Secondary | ICD-10-CM | POA: Diagnosis not present

## 2024-05-20 DIAGNOSIS — E785 Hyperlipidemia, unspecified: Secondary | ICD-10-CM | POA: Diagnosis not present

## 2024-05-20 DIAGNOSIS — M549 Dorsalgia, unspecified: Secondary | ICD-10-CM | POA: Diagnosis not present

## 2024-05-20 DIAGNOSIS — I50810 Right heart failure, unspecified: Secondary | ICD-10-CM | POA: Diagnosis not present

## 2024-05-20 DIAGNOSIS — Z888 Allergy status to other drugs, medicaments and biological substances status: Secondary | ICD-10-CM | POA: Diagnosis not present

## 2024-05-20 DIAGNOSIS — J479 Bronchiectasis, uncomplicated: Secondary | ICD-10-CM | POA: Insufficient documentation

## 2024-05-20 DIAGNOSIS — Z9889 Other specified postprocedural states: Secondary | ICD-10-CM

## 2024-05-20 DIAGNOSIS — G8929 Other chronic pain: Secondary | ICD-10-CM | POA: Diagnosis not present

## 2024-05-20 DIAGNOSIS — G4733 Obstructive sleep apnea (adult) (pediatric): Secondary | ICD-10-CM | POA: Insufficient documentation

## 2024-05-20 DIAGNOSIS — R0602 Shortness of breath: Secondary | ICD-10-CM | POA: Diagnosis present

## 2024-05-20 DIAGNOSIS — Z8673 Personal history of transient ischemic attack (TIA), and cerebral infarction without residual deficits: Secondary | ICD-10-CM | POA: Insufficient documentation

## 2024-05-20 DIAGNOSIS — I340 Nonrheumatic mitral (valve) insufficiency: Secondary | ICD-10-CM | POA: Diagnosis not present

## 2024-05-20 NOTE — Patient Instructions (Signed)
 Referrals:  YOU HAVE BEEN REFERRED TO PULMONARY REHAB THEY WILL REACH OUT TO YOU OR CALL TO ARRANGE THIS. PLEASE CALL US  WITH ANY CONCERNS   Follow-Up in: AS NEEDED   At the [facility], you and your health needs are our priority. We have a designated team specialized in the treatment of Heart Failure. This Care Team includes your primary Heart Failure Specialized Cardiologist (physician), Advanced Practice Providers (APPs- Physician Assistants and Nurse Practitioners), and Pharmacist who all work together to provide you with the care you need, when you need it.   You may see any of the following providers on your designated Care Team at your next follow up:  Dr. Toribio Fuel Dr. Ezra Shuck Dr. Odis Brownie Greig Mosses, NP Caffie Shed, [place] [facility] [place], [place] Beckey Coe, NP Jordan Lee, NP Tinnie Redman, PharmD   Please be sure to bring in all your medications bottles to every appointment.   Need to Contact Us :  If you have any questions or concerns before your next appointment please send us  a message through [place] or call our office at [phone].    TO LEAVE A MESSAGE FOR THE NURSE SELECT OPTION 2, PLEASE LEAVE A MESSAGE INCLUDING: YOUR NAME DATE OF BIRTH CALL BACK NUMBER REASON FOR CALL**this is important as we prioritize the call backs  YOU WILL RECEIVE A CALL BACK THE SAME DAY AS LONG AS YOU CALL BEFORE 4:00 PM

## 2024-05-20 NOTE — Progress Notes (Signed)
 [facility] CONSULT NOTE  Referring Physician: Dr. Pietro Primary Care: [name], MD Primary Cardiologist: Redell Pietro, MD  Chief Complaint: SOB  HPI:   [name] is a 78 y.o. female with a history of non-obstructive CAD, chronic HFpEF with chronic dyspnea, paroxysmal atrial fibrillation s/p MAZE procedure and left atrial appendage clipping in 01/2020 (no longer on anticoagulation due to recurrent nose bleeds), severe mitral regurgitation s/p minimally invasive mitral valve repair in 01/2020, bronchiectasis, CVA noted incidentally on brain MRI in 01/2024, hypertension, hyperlipidemia, obstructive sleep apnea, vertigo, chronic back pain, severe anxiety, and multiple medication intolerances who is followed by Dr. Pietro who is referred for further evaluation of her chronic dyspnea.    Patient was previously followed by Dr. Blanca and now follows with Dr. Pietro for paroxysmal atrial fibrillation and mitral valve disease. She was diagnosed with atrial fibrillation in 2017. She had progression of mitral valve regurgitation  in 2021 and ultimately underwent a minimally invasive mitral valve repair with MAZE procedure and clipping of left atrial appendage in 01/2020. Procedure was complicated by LV free wall laceration which required sternotomy with direct repair. Post-op course was complicated by recurrent atrial fibrillation requiring DCCV and Amiodarone  load. Cardiac catheterization prior to surgery showed mild non-obstructive CAD with only 20% stenosis of proximal RCA and distal LAD. ABIs in 11/2022 for further evaluation of leg pain was normal. Echo in 01/2023 showed LVEF of 55-60% with mild LVH, moderately enlarged RV with moderately reduced function and an flattened interventricular septum in systole consistent with RV pressure overload, normal structure and function of mitral valve prosthesis, moderate TR, moderate PR, and moderately elevated PASP of 48.9 mmHg. She was noted  to go back into atrial fibrillation in early 2025. She was loaded with Amiodarone  and then underwent DCCV in 10/2023.    She saw Pulmonology in 02/2024. Per their note, she has a history of mild bronchiectasis and restrictive lung disease. PFTSs were markedly abnormal.  Dyspnea felt to be multifactorial. Considered hi-res CT  She has been having severe DOE and has been seen multiple times in Cardiology office.   PFTs 6/22 FEV1 1.15 (50%) FVC 1.55 (51%) DLCO 108%   Echo 10/25: EF 55-60% RV markedly dilated mod HK Severe TR RVSP 75 Triv MR MV gradient 2.53mmHG  She underwent R/L cath (04/28/24)) Ao = 114/49 (75) LV = 114/10 RA = 9 RV = 43/13 PA = 42/10 (24) PCW = 16 (v wave 32) Fick cardiac output/index = 4.4/2.9 PVR = 1.35 WU Ao sat = 91% PA sat = 62%, 65% PAPi = 3.3 MV gradient: Mean 11 MVA 1.35cm2   Assessment: 1. LVEF normal 60-65% 2. Minimal non-obstructive CAD (RCA 20%) 3. Mild PAH with normal left-sided filling pressures 4. Prominent v-waves in PCWP tracing suggestive of significant MR versus diastolic dysfunction.  5. Simultaneous LV-wedge tracings suggest moderate MS     Here for post-cath f/u. Remains very SOB with any activity. No orthopnea or PND. Edema well controlled on diuretics.Very anxious about her condition. Doesn't want to have any more procedures. Also c/o hoarseness and swallowing difficulties. Has severe neuropathy.    Past Medical History:  Diagnosis Date   Allergies    Anxiety    Arthritis    maybe in my back (03/31/2018)   Benign paroxysmal positional vertigo 06/08/2013   Complication of anesthesia    Fracture of multiple ribs 2015   don't know from what; dx'd when I in hospital for 1st back OR (  03/31/2018)   GERD (gastroesophageal reflux disease)    Hair loss 04/12/2012   Herpes    History of blood transfusion    twice; related to back OR (03/31/2018)   History of kidney stones    Interstitial cystitis 11/06/2011   Melanoma of ankle  (HCC) ~ 2003   right   Mitral regurgitation    Osteopenia 02/18/2012   Osteoporosis    PAF (paroxysmal atrial fibrillation) (HCC) 2012   Peripheral neuropathy 11/06/2011   PMDD (premenstrual dysphoric disorder)    PONV (postoperative nausea and vomiting)    nausea, vomiting, hives and dizziness    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/2020   Complete bilateral atrial lesion set using cryothermy and bipolar radiofrequency ablation with clipping of LA appendage via right mini-thoracotomy approach   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   Complex valvuloplasty including artificial Gore-tex neochord placement x12 with 32mm Sorin Memo 4D ring annuloplasty   Seasonal allergies    Vaginal delivery    ONE NSVD   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     amiodarone  (PACERONE ) 200 MG tablet Take 200 mg by mouth daily.     apixaban  (ELIQUIS ) 2.5 MG TABS tablet Take 1 tablet (2.5 mg total) by mouth 2 (two) times daily. 60 tablet 11   Calcium  Carbonate Antacid (TUMS CHEWY BITES PO) Take 1 tablet by mouth daily as needed (reflux).      Calcium -Cholecalciferol-Zinc (VIACTIV CALCIUM  IMMUNE) 650-20-5.5 MG-MCG-MG CHEW Chew 1 tablet by mouth daily.     Carboxymethylcellul-Glycerin  (LUBRICATING EYE DROPS OP) Place 1 drop into both eyes 3 (three) times daily as needed (dry/irritated eys.).     cephALEXin  (KEFLEX ) 500 MG capsule Rake all 4 tablets 1 hour prior to dental procedure 4 capsule 6   clonazePAM  (KLONOPIN ) 0.5 MG tablet Take 0.5 tablets (0.25 mg total) by mouth 2 (two) times daily as needed for anxiety. 30 tablet 3   cyanocobalamin  (VITAMIN B12) 1000 MCG/ML injection Inject 1,000 mcg into the muscle every 30 (thirty) days.     denosumab  (PROLIA ) 60 MG/ML SOSY injection Inject 60 mg into the skin every 6 (six) months.     diphenhydrAMINE   (BENADRYL ) 50 MG tablet Take 1 tablet (50 mg total) by mouth as directed. 1 tablet 0   furosemide  (LASIX ) 40 MG tablet Take 1.5 tablets (60 mg total) by mouth daily. 45 tablet 2   levalbuterol  (XOPENEX  HFA) 45 MCG/ACT inhaler Inhale 2 puffs into the lungs every 4 (four) hours as needed for wheezing. 1 each 6   levothyroxine  (SYNTHROID ) 75 MCG tablet Take 1 tablet (75 mcg total) by mouth daily. 90 tablet 0   loratadine (CLARITIN) 10 MG tablet Take 10 mg by mouth in the morning.     meclizine  (ANTIVERT ) 25 MG tablet TAKE 1 TABLET BY MOUTH THREE TIMES DAILY AS NEEDED FOR DIZZINESS 45 tablet 0   metoprolol  tartrate (LOPRESSOR ) 25 MG tablet Take 1 tablet by mouth twice daily 180 tablet 3   nystatin  (MYCOSTATIN /NYSTOP ) powder Apply topically 2 (two) times daily as needed (skin irritation (under breasts)). 15 g 0   ondansetron  (ZOFRAN ) 8 MG tablet Take 1 tablet (8 mg total) by mouth every 8 (eight) hours as needed for nausea or vomiting. 45 tablet 1   ondansetron  (ZOFRAN -ODT) 4 MG disintegrating tablet Take 1 tablet (4 mg total) by mouth  every 8 (eight) hours as needed for nausea or vomiting. 20 tablet 0   oxyCODONE  (ROXICODONE ) 5 MG/5ML solution Take 4 mLs (4 mg total) by mouth every 4 (four) hours as needed for moderate pain.  0   pantoprazole  (PROTONIX ) 40 MG tablet Take 1 tablet by mouth once daily 30 tablet 0   polyethylene glycol (MIRALAX  / GLYCOLAX ) packet Take 17 g by mouth daily as needed for mild constipation.     potassium chloride  SA (KLOR-CON  M) 20 MEQ tablet Take 1 tablet (20 mEq total) by mouth daily. 30 tablet 2   sodium chloride  (OCEAN) 0.65 % SOLN nasal spray Place 1 spray into both nostrils as needed for congestion (nose bleeds).     valACYclovir  (VALTREX ) 500 MG tablet Take 500 mg by mouth daily as needed (breakouts).     Cholecalciferol (VITAMIN D3) 50 MCG (2000 UT) capsule Take 2,000 Units by mouth daily.     Naloxone HCl 0.4 MG/ML SOCT once as needed. (Patient not taking: Reported on  05/20/2024)     predniSONE  (DELTASONE ) 50 MG tablet Take 1 tablet (50 mg total) by mouth as directed. (Patient not taking: Reported on 05/20/2024) 3 tablet 0   revefenacin  (YUPELRI ) 175 MCG/3ML nebulizer solution Inhale one vial in nebulizer once daily. Do not mix with other nebulized medications. (Patient not taking: Reported on 05/20/2024) 3 mL 11   No current facility-administered medications for this encounter.    Allergies  Allergen Reactions   Buprenorphine Nausea Only and Other (See Comments)    sedation and adhesive reaction--patches    Erythromycin Nausea And Vomiting and Other (See Comments)    Dizziness     Iodinated Contrast Media Hives, Nausea Only and Rash   Latex Palpitations, Rash and Other (See Comments)    Hives/sores Gloves   Other Other (See Comments)    CAN NOT TAKE , aLPRAZOLAM, OR ELAVIL DUE TO AFIB FOR ANXIETY   Steroids-jittery/hyper wants to crawl out of skin   Tape Hives, Itching, Rash and Other (See Comments)    Heart monitor stickers must be rotated in order to prevent rash Adhesive on EKG tabs=BURNS SKIN**PAPER TAPE OK** sores     Ciprofloxacin  Other (See Comments)    Burning all over   Duloxetine  Hcl Other (See Comments)    Severe diarrhea and upset stomach   Gabapentin  Other (See Comments)    Unknown--unsteady gait/felt intoxicated   Hydrocodone Other (See Comments)    Sedation,dizziness, and nausea   Hydromorphone  Nausea Only and Other (See Comments)    Sedation,dizziness, and nausea   Iodine Hives   Levofloxacin Nausea And Vomiting and Nausea Only   Metrizamide Hives   Nsaids     CANNOT TAKE PER CARDIOLOGIST DUE TO AFIB    Nucynta [Tapentadol Hcl] Nausea And Vomiting and Other (See Comments)    nausea and sedation   Oxycodone  Itching, Nausea Only and Other (See Comments)    Delusions (intolerance)  PILLS ONLY sedation, dizziness, nausea,  and itching   Pentazocine Other (See Comments)    Unknown    Pregabalin Nausea Only     Unknown    Sulfasalazine Hives   Morphine Nausea Only, Anxiety and Other (See Comments)    sedation and nausea delusions (intolerance) PT CANNOT WAKE UP AFTER TAKING MEDICATION AND HAS NIGHTMARES     Sulfa Antibiotics Hives, Nausea Only and Rash   Sulfamethoxazole-Trimethoprim Hives and Rash      Social History   Socioeconomic History   Marital status: Divorced  Spouse name: none/divorced   Number of children: Not on file   Years of education: Not on file   Highest education level: 12th grade  Occupational History   Occupation: retired  Tobacco Use   Smoking status: Former    Current packs/day: 0.00    Average packs/day: 0.1 packs/day for 14.0 years (1.4 ttl pk-yrs)    Types: Cigarettes    Start date: 1966    Quit date: 96    Years since quitting: 45.9    Passive exposure: Past   Smokeless tobacco: Never   Tobacco comments:    Former smoker 12/07/23  Vaping Use   Vaping status: Never Used  Substance and Sexual Activity   Alcohol  use: Never   Drug use: Yes    Types: Oxycodone , Benzodiazepines    Comment: 03/31/2018 for chronic neck and back pain, takes Klonopin  at times.    Sexual activity: Not Currently  Other Topics Concern   Not on file  Social History Narrative   Lives by herself.    Social Drivers of Corporate Investment Banker Strain: High Risk (03/09/2024)   Overall Financial Resource Strain (CARDIA)    Difficulty of Paying Living Expenses: Very hard  Food Insecurity: No Food Insecurity (03/09/2024)   Hunger Vital Sign    Worried About Running Out of Food in the Last Year: Never true    Ran Out of Food in the Last Year: Never true  Transportation Needs: No Transportation Needs (03/09/2024)   PRAPARE - Administrator, Civil Service (Medical): No    Lack of Transportation (Non-Medical): No  Physical Activity: Inactive (10/23/2022)   Exercise Vital Sign    Days of Exercise per Week: 0 days    Minutes of Exercise per Session: 0 min  Stress:  Stress Concern Present (03/18/2024)   [employer] of Occupational Health - Occupational Stress Questionnaire    Feeling of Stress: To some extent  Social Connections: Socially Isolated (10/23/2022)   Social Connection and Isolation Panel    Frequency of Communication with Friends and Family: More than three times a week    Frequency of Social Gatherings with Friends and Family: Once a week    Attends Religious Services: Never    Database Administrator or Organizations: No    Attends Banker Meetings: Never    Marital Status: Divorced  Catering Manager Violence: Not At Risk (03/03/2024)   Humiliation, Afraid, Rape, and Kick questionnaire    Fear of Current or Ex-Partner: No    Emotionally Abused: No    Physically Abused: No    Sexually Abused: No      Family History  Problem Relation Age of Onset   Diabetes Father    Hyperlipidemia Sister    Heart disease Sister    Stroke Sister    Diabetes Brother    Hyperlipidemia Sister    Heart disease Sister    Arthritis Mother    Heart disease Mother    Uterine cancer Mother    Diabetes Brother    Heart Problems Brother     Vitals:   05/20/24 1420  BP: 110/64  Pulse: 63  SpO2: 98%  Weight: 49.7 kg (109 lb 9.6 oz)  Height: 5' 6 (1.676 m)    PHYSICAL EXAM: General:  Elderly cachetic HEENT: normal Neck: supple. no JVD. Carotids 2+ bilat; no bruits. No lymphadenopathy or thryomegaly appreciated. Cor: PMI nondisplaced. Regular rate & rhythm.  2/6 TR. Lungs: decreased throughout Abdomen: soft, nontender,  nondistended. No hepatosplenomegaly. No bruits or masses. Good bowel sounds. Extremities: no cyanosis, clubbing, rash, edema Neuro: alert & oriented x 3, cranial nerves grossly intact. moves all 4 extremities w/o difficulty. Affect pleasant.   ASSESSMENT & PLAN:  Chronic Dyspnea  - this is clear multifactorial due to a combination of diastolic HF, RV failure, significant underlying lung disease and  deconditioning - I reviewed numerous relevant studies with her including PFTs/chest CT from 2022, recent echo, R/L cath - Hemodynamics on recent cath were better than I expected and do not explain her degree of dyspnea  There is no room for further diuresis. - She had significant lung disease on PFTs and chest CT in 2022 and these studies have not been repeated. I do think it may be reasonable to reassess given her progressive symptoms. I suggested she discuss this with Pulmonary - She does have very severe TR but is not interested in triClip and would likely not be good candidate - I was very frank with her and told her the best we can do from a cardiac perspective is to keep her fluid in check and I have referred her to Pulmonary Rehab - Given amio therapy - would be worth checking ESR and repeating high-res CT  Severe Mitral Regurgitation s/p MVR - S/p minimally invasive valve repair in 01/2020. Procedure was complicated by LV free wall laceration which required sternotomy with direct repair.  - Echo 10/25: EF 55-60% RV markedly dilated mod HK Severe TR RVSP 75 Triv MR MV gradient 2.33mmHG - MV gradient was elevated on cath but both Dr. Pietro and I have reviewed TTEE and feel MV is ok and she doesn't require TEE at this point   Non-Obstructive CAD - minimal CAD on recent cath   Paroxysmal Atrial Fibrillation - S/p MAZE procedure and left atrial appendage clipping in 01/2020 at time of valve repair.  - Maintaining sinus rhythm.  - Continue Lopressor  25mg  twice daily.  - Continue amio and eliquis   I spent a total of 55 minutes today: 1) reviewing the patient's medical records including previous charts, labs and recent notes from other providers; 2) examining the patient and counseling them on their medical issues/explaining the plan of care; 3) adjusting meds as needed and 4) ordering lab work or other needed tests.      Toribio Fuel, MD  2:48 PM

## 2024-05-23 ENCOUNTER — Other Ambulatory Visit (HOSPITAL_COMMUNITY)

## 2024-05-23 ENCOUNTER — Other Ambulatory Visit: Payer: Self-pay | Admitting: Licensed Clinical Social Worker

## 2024-05-23 DIAGNOSIS — Z23 Encounter for immunization: Secondary | ICD-10-CM | POA: Diagnosis not present

## 2024-05-23 NOTE — Patient Instructions (Signed)
 Visit Information  Thank you for taking time to visit with me today. Please don't hesitate to contact me if I can be of assistance to you in the future!  Please call the care guide team at [phone] if you need to cancel, schedule, or reschedule an appointment.   Please call the Suicide and Crisis Lifeline: 988 call the [place]  National Suicide Prevention Lifeline: 248 002 6737 or TTY: 234-109-0397 TTY 4190691010) to talk to a trained counselor call 1-800-273-TALK (toll free, 24 hour hotline) go to [facility] [address], [place] [phone]) call 911 if you are experiencing a Mental Health or Behavioral Health Crisis or need someone to talk to.  Lyle Rung, BSW, MSW, LCSW Licensed Clinical Social Worker [employer]   [facility] [email or web address] Direct Dial: [phone]

## 2024-05-23 NOTE — Patient Outreach (Signed)
 Complex Care Management   Visit Note  05/23/2024  Name:  NEERA TENG MRN: [id number] DOB: 1946-05-06  Situation: Referral received for Complex Care Management related to Mental/Behavioral Health diagnosis anxiety. I obtained verbal consent from Patient.  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   Mitral regurgitation    Osteopenia 02/18/2012   Osteoporosis    PAF (paroxysmal atrial fibrillation) (HCC) 2012   Peripheral neuropathy 11/06/2011   PMDD (premenstrual dysphoric disorder)    PONV (postoperative nausea and vomiting)    nausea, vomiting, hives and dizziness    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/2020   Complete bilateral atrial lesion set using cryothermy and bipolar radiofrequency ablation with clipping of LA appendage via right mini-thoracotomy approach   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   Complex valvuloplasty including artificial Gore-tex neochord placement x12 with 32mm Sorin Memo 4D ring annuloplasty   Seasonal allergies    Vaginal delivery    ONE NSVD   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    Assessment: Patient Reported Symptoms:  Cognitive Cognitive Status: Able to follow simple commands, Struggling with memory recall, Alert and oriented to person, place, and time Cognitive/Intellectual Conditions Management [RPT]: None reported or documented in medical history or problem list   Health  Maintenance Behaviors: Annual physical exam, Stress management Healing Pattern: Slow Health Facilitated by: Stress management, Rest, Prayer/meditation  Neurological Neurological Review of Symptoms: Weakness Neurological Management Strategies: Medication therapy, Routine screening, Adequate rest, Coping strategies Neurological Self-Management Outcome: 3 (uncertain)  HEENT HEENT Symptoms Reported: Change or loss of hearing HEENT Management Strategies: Routine screening, Coping strategies HEENT Self-Management Outcome: 3 (uncertain)    Cardiovascular Cardiovascular Symptoms Reported: Swelling in legs or feet Does patient have uncontrolled Hypertension?: No Cardiovascular Management Strategies: Coping strategies, Routine screening Do You Have a Working Readable Scale?: Yes Cardiovascular Self-Management Outcome: 3 (uncertain)  Respiratory Respiratory Symptoms Reported: Shortness of breath Respiratory Management Strategies: Adequate rest, Fluid modification Respiratory Self-Management Outcome: 3 (uncertain)  Endocrine Endocrine Symptoms Reported: No symptoms reported Is patient diabetic?: No Endocrine Self-Management Outcome: 4 (good)  Gastrointestinal Gastrointestinal Symptoms Reported: Constipation, Reflux/heartburn Gastrointestinal Management Strategies: Adequate rest, Bowel program, Coping strategies    Genitourinary Genitourinary Symptoms Reported: No symptoms reported    Integumentary Integumentary Symptoms Reported: No symptoms reported    Musculoskeletal Musculoskelatal Symptoms Reviewed: Unsteady gait, Difficulty walking, Limited mobility Musculoskeletal Management Strategies: Medical device, Routine screening, Coping strategies Musculoskeletal Self-Management Outcome: 3 (uncertain)      Psychosocial Psychosocial Symptoms Reported: Depression - if selected complete PHQ 2-9 Additional Psychological Details: Patient agreeable to contact [facility] today  to schedule intake Behavioral Management Strategies: Adequate rest, Coping strategies, Medication therapy Behavioral Health Self-Management Outcome: 3 (uncertain) Major Change/Loss/Stressor/Fears (CP): Resources Techniques to [employer] with Loss/Stress/Change: Diversional activities, Medication Quality of Family Relationships: supportive  Do you feel physically threatened by others?: No    05/23/2024    PHQ2-9 Depression Screening   Little interest or pleasure in doing things Several days  Feeling down, depressed, or hopeless Several days  PHQ-2 - Total Score 2  Trouble falling or staying asleep, or sleeping too much Several days  Feeling tired or having little energy Nearly every day  Poor appetite or overeating  Not at all  Feeling bad about yourself - or that you are a failure or have let yourself or your family down Not at all  Trouble concentrating on things, such as reading the newspaper or watching television Not at all  Moving or speaking so slowly that other people could have noticed.  Or the opposite - being so fidgety or restless that you have been moving around a lot more than usual Several days  Thoughts that you would be better off dead, or hurting yourself in some way Not at all  PHQ2-9 Total Score 7  If you checked off any problems, how difficult have these problems made it for you to do your work, take care of things at home, or get along with other people Somewhat difficult  Depression Interventions/Treatment Medication, Counseling, Community Resources Provided ([facility] number provided to patient as she needs to call them back for scheduling, PCP placed referral last month)      05/23/2024    1:26 PM 04/17/2024    8:21 PM 01/19/2024    2:15 PM 11/13/2023    9:48 AM  GAD 7 : Generalized Anxiety Score  Nervous, Anxious, on Edge 3 3 3 1   Control/stop worrying 2 2 2 1   Worry too much - different things 1 1 1 1   Trouble relaxing 1 2 2 1   Restless  1 1 1  0  Easily annoyed or irritable 0 1 1 0  Afraid - awful might happen 1 1 1  0  Total GAD 7 Score 9 11 11 4   Anxiety Difficulty Somewhat difficult Somewhat difficult Very difficult Not difficult at all    SDOH Interventions    Flowsheet Row Patient Outreach Telephone from 05/23/2024 in [place] [address] POPULATION HEALTH DEPARTMENT Patient Outreach Telephone from 04/06/2024 in [place] POPULATION HEALTH DEPARTMENT Patient Outreach Telephone from 03/18/2024 in [place] POPULATION HEALTH DEPARTMENT Patient Outreach Telephone from 03/09/2024 in [place] POPULATION HEALTH DEPARTMENT Patient Outreach Telephone from 03/03/2024 in [place] POPULATION HEALTH DEPARTMENT Patient Outreach Telephone from 02/02/2024 in [place] POPULATION HEALTH DEPARTMENT  SDOH Interventions        Food Insecurity Interventions Intervention Not Indicated -- -- -- -- Intervention Not Indicated  Housing Interventions Intervention Not Indicated -- -- -- Intervention Not Indicated Intervention Not Indicated  Transportation Interventions [employer] Provided, Patient Resources (Friends/Family), Payor Benefit  [employer] community resource information provided to patient for transportation assistance.] -- -- [employer] Provided -- --  Utilities Interventions -- -- -- -- -- Intervention Not Indicated  Depression Interventions/Treatment  Medication, Counseling, Community Resources Provided  [[facility] number provided to patient as she needs to call them back for scheduling, PCP placed referral last month] Referral to Psychiatry, Medication, Counseling Referral to Psychiatry -- Referral to Psychiatry --  Financial Strain Interventions [employer] Resources Provided -- -- [employer] Provided Other (Comment)  [vbci bsw referral] --  Stress Interventions [employer] Provided, Provide Counseling, Other (Comment)  [Patient was provided [facility] number as she forgot  to contact them before to get scheduled for both therapy and psychiatry] -- [employer] Provided, Provide Counseling -- -- --    There were no vitals filed for this visit.    Medications Reviewed Today     Reviewed by Merlynn Lyle CROME, LCSW (Social Worker) on 05/23/24 at 1322  Med List Status: <None>   Medication Order Taking? Sig Documenting Provider Last Dose Status Informant  acetaminophen  (TYLENOL ) 500 MG tablet [id number]  Take 500 mg by mouth every 6 (six) hours as needed (pain.). [provider]  Active Self  amiodarone  (PACERONE ) 200 MG tablet [id number]  Take 200 mg by mouth daily. [provider]  Active Self  apixaban  (ELIQUIS ) 2.5 MG TABS tablet [id number]  Take 1 tablet (2.5 mg total) by mouth 2 (two) times daily. Pietro Redell RAMAN, MD  Active Self  Calcium  Carbonate Antacid (TUMS CHEWY BITES PO) [id number]  Take 1 tablet by mouth daily as needed (reflux).  [provider]  Active Self  Calcium -Cholecalciferol-Zinc (VIACTIV CALCIUM  IMMUNE) 650-20-5.5 MG-MCG-MG CHEW [id number]  Chew 1 tablet by mouth daily. [provider]  Active Self  Carboxymethylcellul-Glycerin  (LUBRICATING EYE DROPS OP) [id number]  Place 1 drop into both eyes 3 (three) times daily as needed (dry/irritated eys.). [provider]  Active Self  cephALEXin  (KEFLEX ) 500 MG capsule [id number]  Rake all 4 tablets 1 hour prior to dental procedure Pietro Redell RAMAN, MD  Active Self           Med Note CHRISTIE ALEXANDER   Tue Apr 26, 2024  4:40 PM) Prior to Dental Procedure  Cholecalciferol (VITAMIN D3) 50 MCG (2000 UT) capsule [id number]  Take 2,000 Units by mouth daily. [provider]  Active Self  clonazePAM  (KLONOPIN ) 0.5 MG tablet [id number]  Take 0.5 tablets (0.25 mg total) by mouth 2 (two) times daily as needed for anxiety. Tabori, Katherine E, MD  Active Self  cyanocobalamin  (VITAMIN B12) 1000 MCG/ML injection [id number]   Inject 1,000 mcg into the muscle every 30 (thirty) days. [provider]  Active Self  denosumab  (PROLIA ) 60 MG/ML SOSY injection [id number]  Inject 60 mg into the skin every 6 (six) months. [provider]  Active Self           Med Note JACKOLYN APOLINAR JONELLE Pablo Jun 11, 2020  2:33 PM)    diphenhydrAMINE  (BENADRYL ) 50 MG tablet [id number]  Take 1 tablet (50 mg total) by mouth as directed. Bensimhon, Toribio JONELLE, MD  Active Self           Med Note CHRISTIE ALEXANDER   Tue Apr 26, 2024  4:46 PM) Prior to procedure  furosemide  (LASIX ) 40 MG tablet [id number]  Take 1.5 tablets (60 mg total) by mouth daily. Goodrich, Callie E, PA-C  Active Self  levalbuterol  (XOPENEX  HFA) 45 MCG/ACT inhaler [id number]  Inhale 2 puffs into the lungs every 4 (four) hours as needed for wheezing. Kara Dorn NOVAK, MD  Active Self  levothyroxine  (SYNTHROID ) 75 MCG tablet [id number]  Take 1 tablet (75 mcg total) by mouth daily. [name], MD  Active Self  loratadine (CLARITIN) 10 MG tablet [id number]  Take 10 mg by mouth in the morning. [provider]  Active Self  meclizine  (ANTIVERT ) 25 MG tablet [id number]  TAKE 1 TABLET BY MOUTH THREE TIMES DAILY AS NEEDED FOR DIZZINESS Tabori, Katherine E, MD  Active Self           Med Note (HEATER, MEGAN S   Tue  May 03, 2024  1:26 PM) prn  metoprolol  tartrate (LOPRESSOR ) 25 MG tablet [id number]  Take 1 tablet by mouth twice daily Pietro Redell RAMAN, MD  Active Self  Naloxone HCl 0.4 MG/ML SOCT [id number]  once as needed.  Patient not taking: Reported on 05/20/2024   [provider]  Active   nystatin  (MYCOSTATIN /NYSTOP ) powder [id number]  Apply topically 2 (two) times daily as needed (skin irritation (under breasts)). Maurice Sharlet RAMAN, PA-C  Active Self  ondansetron  (ZOFRAN ) 8 MG tablet [id number]  Take 1 tablet (8 mg total) by mouth every 8 (eight) hours as needed for nausea or vomiting. Tabori, Katherine E, MD  Active Self  ondansetron  (ZOFRAN -ODT)  4 MG disintegrating tablet [id number]  Take 1 tablet (4 mg total) by mouth every 8 (eight) hours as needed for nausea or vomiting. Ruthe Cornet, DO  Active Self  oxyCODONE  (ROXICODONE ) 5 MG/5ML solution [id number]  Take 4 mLs (4 mg total) by mouth every 4 (four) hours as needed for moderate pain. Maurice Sharlet RAMAN, PA-C  Active Self           Med Note JACKOLYN APOLINAR JONELLE Pablo Jun 11, 2020  2:33 PM)    pantoprazole  (PROTONIX ) 40 MG tablet [id number]  Take 1 tablet by mouth once daily Dewald, Jonathan B, MD  Active   polyethylene glycol (MIRALAX  / GLYCOLAX ) packet [id number]  Take 17 g by mouth daily as needed for mild constipation. [provider]  Active Self  potassium chloride  SA (KLOR-CON  M) 20 MEQ tablet [id number]  Take 1 tablet (20 mEq total) by mouth daily. Goodrich, Callie E, PA-C  Active Self  predniSONE  (DELTASONE ) 50 MG tablet [id number]  Take 1 tablet (50 mg total) by mouth as directed.  Patient not taking: Reported on 05/20/2024   Bensimhon, Toribio JONELLE, MD  Active Self           Med Note PHEBE DELON RAMAN [place] Apr 28, 2024  9:47 AM) 10/22- 1930, 10/23 0130 and 0730   revefenacin  (YUPELRI ) 175 MCG/3ML nebulizer solution [id number]  Inhale one vial in nebulizer once daily. Do not mix with other nebulized medications.  Patient not taking: Reported on 05/20/2024   Kara Dorn NOVAK, MD  Active Self  sodium chloride  (OCEAN) 0.65 % SOLN nasal spray [id number]  Place 1 spray into both nostrils as needed for congestion (nose bleeds). [provider]  Active Self  valACYclovir  (VALTREX ) 500 MG tablet [id number]  Take 500 mg by mouth daily as needed (breakouts). [provider]  Active Self            Recommendation:   PCP Follow-up Specialty provider follow-up [facility] Continue Current Plan of Care  Follow Up Plan:   Closing From:  VBCI LCSW, RNCM remains involved for hypertension  Lyle Rung, BSW, MSW, LCSW Licensed Clinical  Social Worker [employer]   [email or web address] Direct Dial: [phone]

## 2024-05-24 ENCOUNTER — Telehealth (HOSPITAL_COMMUNITY): Payer: Self-pay

## 2024-05-24 DIAGNOSIS — Z79891 Long term (current) use of opiate analgesic: Secondary | ICD-10-CM | POA: Diagnosis not present

## 2024-05-24 DIAGNOSIS — G894 Chronic pain syndrome: Secondary | ICD-10-CM | POA: Diagnosis not present

## 2024-05-24 NOTE — Telephone Encounter (Signed)
 Pt insurance is active and benefits verified through [employer]. Co-pay $0, DED $257/unknown met, out of pocket $0/$0 met, co-insurance 20%. No pre-authorization required.   2ndary insurance is active and benefits verified through [place]. Co-pay $0, DED $0/$0 met, out of pocket $0/$0 met, co-insurance 0%. No pre-authorization required.

## 2024-05-25 ENCOUNTER — Telehealth (HOSPITAL_COMMUNITY): Payer: Self-pay

## 2024-05-25 NOTE — Telephone Encounter (Signed)
 Unable to LVM for pt. Attempted to contact to schedule for Pulm Rehab.

## 2024-05-26 NOTE — Progress Notes (Signed)
 Cardiology Office Note    Date:  05/27/2024  ID:  Regina, [name] 1946-03-20, MRN [id number] PCP:  [name], MD  Cardiologist:  Redell Shallow, MD  Electrophysiologist:  Soyla Gladis Norton, MD   Chief Complaint: Follow up for shortness of breath   History of Present Illness: .   [name] is a 78 y.o. female with visit-pertinent history of non-obstructive CAD on cardiac catheterization in 11/2019, chronic HFpEF with chronic dyspnea, paroxysmal atrial fibrillation s/p MAZE procedure and left atrial appendage clipping in 01/2020 (no longer on anticoagulation due to recurrent nose bleeds), severe mitral regurgitation s/p minimally invasive mitral valve repair in 01/2020, bronchiectasis, CVA noted incidentally on brain MRI in 01/2024, hypertension, hyperlipidemia, obstructive sleep apnea, vertigo, chronic back pain, severe anxiety, and multiple medication intolerances who is followed by Dr. Shallow and presents today for evaluation of chest pain and shortness of breath.    Patient was previously followed by Dr. Blanca and now follows with Dr. Shallow for paroxysmal atrial fibrillation and mitral valve disease. She was diagnosed with atrial fibrillation in 2017. Monitor in 2020 showed occasional runs of SVT (longest episode 15 seconds) and rare PACs/ PVCs but no atrial fibrillation. She had progression of mitral valve regurgitation to the severe range in 2021 and ultimately underwent a minimally invasive mitral valve repair with MAZE procedure and clipping of left atrial appendage in 01/2020. Procedure was complicated by LV free wall laceration which required sternotomy with direct repair. Post-op course was complicated by recurrent atrial fibrillation requiring DCCV and Amiodarone  load. Cardiac catheterization prior to surgery showed mild non-obstructive CAD with only 20% stenosis of proximal RCA and distal LAD. ABIs in 11/2022 for further evaluation of leg pain was normal. Last Echo in 01/2023  showed LVEF of 55-60% with mild LVH, moderately enlarged RV with moderately reduced function and an flattened interventricular septum in systole consistent with RV pressure overload, normal structure and function of mitral valve prosthesis, moderate TR, moderate PR, and moderately elevated PASP of 48.9 mmHg. She was noted to go back into atrial fibrillation in early 2025. She was loaded with Amiodarone  and then underwent DCCV in 10/2023.   Patient has had problems this year with persistent dizziness and headaches, evaluated by ENT and neurology.  She has had multiple head CTs and brain MRIs, MRI in 01/2024 showed chronic lacunar infarcts within the right caudate and right corona radiata and evidence of mild to moderate chronic ischemic small vessel disease but no acute ischemic changes.  Repeat brain MRI at [facility] in 02/2024 showed interval development of tiny chronic infarcts in the right caudate and centrum semiovale.  Neurology restarted Eliquis  following this which she had previously been stopped due to recurrent nosebleeds.  Patient saw pulmonology in 02/2024.  Per their note she has history of mild bronchiectasis but not felt to be etiology of her shortness of breath.  PFTs were normal.  Dyspnea felt to be due to diastolic CHF.  Patient with history of moderate sleep apnea but has disinclined CPAP devices.  In 03/2024 patient notified the office of ongoing chest pain and shortness of breath.  She reported intermittent vague chest discomfort described as an uncomfortable feeling in his chest, lasting a few seconds up to 1 to 2 minutes and improves if she applied heat.  She also reported occasional sharp chest pains lasting 1 to 2 seconds and resolved.  She continued to have shortness of breath with minimal exertion.  Patient underwent right and left  cardiac catheterization with Dr. Bensimhon.  LVEF normal at 60 to 65% patient with minimal nonobstructive CAD, mild PAH with normal left-sided filling pressures,  patient with prominent V waves and PCWP tracings suggestive of significant MR versus diastolic dysfunction, simultaneous LV wedge tracing suggested moderate MS.  She is last in clinic on 05/20/2024 by Dr. Cherrie, remained short of breath with any activity.  It was noted that her edema was well-controlled on diuretics.  It was noted that her chronic dyspnea was clearly multifactorial due to combination of diastolic heart failure, RV failure, significant underlying lung disease and deconditioning.  It was noted that her hemodynamics on recent cath were better than expected and did not explain her degree of dyspnea, no room for further diuresis.  Patient was referred to pulmonary rehab.  Patient was seen by pulmonology on 05/03/2024, patient to have PFTs in January also to have HRCT.  Today she presents for follow-up.  She reports that she does not feel that she has been doing well.  Patient reports that she overall just does not feel well.  She continues to note dyspnea on exertion, denies any significant changes.  She reports that her lower extremity edema has overall been well-controlled.  She has started wearing oxygen at night per pulmonology, feels that she has been breathing slightly better at night.  She notes some overall atypical chest pain that is again fleeting.  She also notes concerns with nerve pain in her feet, she reports that she has been evaluated by neurology and told that she has neuropathy however has been unable to take gabapentin  she is unable to tolerate the medication.   Labwork independently reviewed: 04/28/2024: Hemoglobin 12.2, hematocrit 39, sodium 136, potassium 4.4, creatinine 1.01 ROS: .   Today she denies chest pain, palpitations, melena, hematuria, hemoptysis, diaphoresis, weakness, presyncope, syncope, orthopnea, and PND.  All other systems are reviewed and otherwise negative. Studies Reviewed: SABRA   EKG:  EKG is not ordered today.  CV Studies: Cardiac studies reviewed  are outlined and summarized above. Otherwise please see EMR for full report. Cardiac Studies & Procedures   ______________________________________________________________________________________________ CARDIAC CATHETERIZATION  CARDIAC CATHETERIZATION 04/28/2024  Conclusion   Prox RCA lesion is 20% stenosed.  Findings:  Ao = 114/49 (75) LV = 114/10 RA = 9 RV = 43/13 PA = 42/10 (24) PCW = 16 (v wave 32) Fick cardiac output/index = 4.4/2.9 PVR = 1.35 WU Ao sat = 91% PA sat = 62%, 65% PAPi = 3.3 MV gradient: Mean 11 MVA 1.35cm2  Assessment: 1. LVEF normal 60-65% 2. Minimal non-obstructive CAD 3. Mild PAH with normal left-sided filling pressures 4. Prominent v-waves in PCWP tracing suggestive of significant MR versus diastolic dysfunction. 5. Simultaneous LV-wedge tracings suggest moderate MS  Plan/Discussion:  Suspect dyspnea is multifactorial. Consider TEE to further evaluate MVR  Toribio Cherrie, MD 12:28 PM  Findings Coronary Findings Diagnostic  Dominance: Right  Left Anterior Descending Vessel is large.  Left Circumflex Vessel is small.  Right Coronary Artery Vessel is large. Prox RCA lesion is 20% stenosed.  Intervention  No interventions have been documented.     ECHOCARDIOGRAM  ECHOCARDIOGRAM COMPLETE 04/13/2024  Narrative ECHOCARDIOGRAM REPORT    Patient Name:   BRANNON DECAIRE Date of Exam: 04/13/2024 Medical Rec #:  [id number]   Height:       66.0 in Accession #:    [id number]  Weight:       109.0 lb Date of Birth:  1946/06/04   BSA:  1.545 m Patient Age:    78 years    BP:           135/63 mmHg Patient Gender: F           HR:           54 bpm. Exam Location:  Outpatient  Procedure: 2D Echo, Color Doppler and Cardiac Doppler (Both Spectral and Color Flow Doppler were utilized during procedure).  Indications:    SOB  History:        Patient has prior history of Echocardiogram examinations, most recent 01/27/2023. S/p MV repair,  procedure date: 02/04/20., s/p MAZE procedure, Mitral Valve Disease, Arrythmias:Atrial Fibrillation, Signs/Symptoms:Shortness of Breath and Chest Pain; Risk Factors:Former Smoker and Hypertension.  Sonographer:    Koleen Popper RDCS Referring Phys: [id number] CALLIE E GOODRICH  IMPRESSIONS   1. Left ventricular ejection fraction, by estimation, is 55 to 60%. The left ventricle has normal function. The left ventricle has no regional wall motion abnormalities. Left ventricular diastolic parameters are indeterminate. There is the interventricular septum is flattened in systole and diastole, consistent with right ventricular pressure and volume overload. 2. Right ventricular systolic function is moderately reduced. The right ventricular size is moderately enlarged. There is severely elevated pulmonary artery systolic pressure. The estimated right ventricular systolic pressure is 75.5 mmHg. 3. Right atrial size was severely dilated. 4. The mitral valve has been repaired/replaced. Trivial mitral valve regurgitation. No evidence of mitral stenosis. The mean mitral valve gradient is 2.0 mmHg. Echo findings are consistent with normal structure and function of the mitral valve prosthesis. 5. The tricuspid valve is abnormal. Tricuspid valve regurgitation is severe. 6. The aortic valve is normal in structure. There is mild calcification of the aortic valve. Aortic valve regurgitation is mild. No aortic stenosis is present. 7. 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 8. The inferior vena cava is dilated in size with <50% respiratory variability, suggesting right atrial pressure of 15 mmHg.  FINDINGS Left Ventricle: Left ventricular ejection fraction, by estimation, is 55 to 60%. The left ventricle has normal function. The left ventricle has no regional wall motion abnormalities. The left ventricular internal cavity size was normal in size. There is no left ventricular hypertrophy. The interventricular septum  is flattened in systole and diastole, consistent with right ventricular pressure and volume overload. Left ventricular diastolic parameters are indeterminate.  Right Ventricle: The right ventricular size is moderately enlarged. No increase in right ventricular wall thickness. Right ventricular systolic function is moderately reduced. There is severely elevated pulmonary artery systolic pressure. The tricuspid regurgitant velocity is 3.89 m/s, and with an assumed right atrial pressure of 15 mmHg, the estimated right ventricular systolic pressure is 75.5 mmHg.  Left Atrium: Left atrial size was normal in size.  Right Atrium: Right atrial size was severely dilated.  Pericardium: There is no evidence of pericardial effusion.  Mitral Valve: The mitral valve has been repaired/replaced. Trivial mitral valve regurgitation. There is a prosthetic annuloplasty ring present in the mitral position. Echo findings are consistent with normal structure and function of the mitral valve prosthesis. No evidence of mitral valve stenosis. MV peak gradient, 9.4 mmHg. The mean mitral valve gradient is 2.0 mmHg.  Tricuspid Valve: The tricuspid valve is abnormal. Tricuspid valve regurgitation is severe. No evidence of tricuspid stenosis.  Aortic Valve: The aortic valve is normal in structure. There is mild calcification of the aortic valve. Aortic valve regurgitation is mild. No aortic stenosis is present.  Pulmonic Valve: The pulmonic valve was normal  in structure. 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 No evidence of pulmonic stenosis.  Aorta: The aortic root is normal in size and structure.  Venous: The inferior vena cava is dilated in size with less than 50% respiratory variability, suggesting right atrial pressure of 15 mmHg.  IAS/Shunts: No atrial level shunt detected by color flow Doppler.   LEFT VENTRICLE PLAX 2D LVIDd:         3.60 cm LVIDs:         2.80 cm LV PW:         0.80 cm LV IVS:        1.00  cm LVOT diam:     1.70 cm LV SV:         38 LV SV Index:   24 LVOT Area:     2.27 cm   RIGHT VENTRICLE             IVC RV S prime:     10.40 cm/s  IVC diam: 2.90 cm TAPSE (M-mode): 1.9 cm  LEFT ATRIUM           Index        RIGHT ATRIUM           Index LA diam:      3.30 cm 2.14 cm/m   RA Area:     19.20 cm LA Vol (A4C): 22.3 ml 14.43 ml/m  RA Volume:   56.50 ml  36.57 ml/m AORTIC VALVE LVOT Vmax:   81.20 cm/s LVOT Vmean:  47.200 cm/s LVOT VTI:    0.166 m  AORTA Ao Root diam: 3.20 cm  MITRAL VALVE             TRICUSPID VALVE MV Area VTI:  1.15 cm   TR Peak grad:   60.5 mmHg MV Peak grad: 9.4 mmHg   TR Vmax:        389.00 cm/s MV Mean grad: 2.0 mmHg MV Vmax:      1.53 m/s   SHUNTS MV Vmean:     54.1 cm/s  Systemic VTI:  0.17 m Systemic Diam: 1.70 cm  Toribio Fuel MD Electronically signed by Toribio Fuel MD Signature Date/Time: 04/13/2024/10:47:09 PM    Final          ______________________________________________________________________________________________       Current Reported Medications:.    Current Meds  Medication Sig   acetaminophen  (TYLENOL ) 500 MG tablet Take 500 mg by mouth every 6 (six) hours as needed (pain.).   amiodarone  (PACERONE ) 200 MG tablet Take 200 mg by mouth daily.   apixaban  (ELIQUIS ) 2.5 MG TABS tablet Take 1 tablet (2.5 mg total) by mouth 2 (two) times daily.   Calcium  Carbonate Antacid (TUMS CHEWY BITES PO) Take 1 tablet by mouth daily as needed (reflux).    Calcium -Cholecalciferol-Zinc (VIACTIV CALCIUM  IMMUNE) 650-20-5.5 MG-MCG-MG CHEW Chew 1 tablet by mouth daily.   Carboxymethylcellul-Glycerin  (LUBRICATING EYE DROPS OP) Place 1 drop into both eyes 3 (three) times daily as needed (dry/irritated eys.).   cephALEXin  (KEFLEX ) 500 MG capsule Rake all 4 tablets 1 hour prior to dental procedure   Cholecalciferol (VITAMIN D3) 50 MCG (2000 UT) capsule Take 2,000 Units by mouth daily.   clonazePAM  (KLONOPIN ) 0.5 MG tablet  Take 0.5 tablets (0.25 mg total) by mouth 2 (two) times daily as needed for anxiety.   cyanocobalamin  (VITAMIN B12) 1000 MCG/ML injection Inject 1,000 mcg into the muscle every 30 (thirty) days.   denosumab  (PROLIA ) 60 MG/ML SOSY injection Inject 60 mg into  the skin every 6 (six) months.   diphenhydrAMINE  (BENADRYL ) 50 MG tablet Take 1 tablet (50 mg total) by mouth as directed.   furosemide  (LASIX ) 40 MG tablet Take 1.5 tablets (60 mg total) by mouth daily.   levalbuterol  (XOPENEX  HFA) 45 MCG/ACT inhaler Inhale 2 puffs into the lungs every 4 (four) hours as needed for wheezing.   levothyroxine  (SYNTHROID ) 75 MCG tablet Take 1 tablet (75 mcg total) by mouth daily.   loratadine (CLARITIN) 10 MG tablet Take 10 mg by mouth in the morning.   meclizine  (ANTIVERT ) 25 MG tablet TAKE 1 TABLET BY MOUTH THREE TIMES DAILY AS NEEDED FOR DIZZINESS   metoprolol  tartrate (LOPRESSOR ) 25 MG tablet Take 1 tablet by mouth twice daily   mupirocin ointment (BACTROBAN) 2 % Place 1 Application into the nose.   Naloxone HCl 0.4 MG/ML SOCT once as needed.   nystatin  (MYCOSTATIN /NYSTOP ) powder Apply topically 2 (two) times daily as needed (skin irritation (under breasts)).   ondansetron  (ZOFRAN ) 8 MG tablet Take 1 tablet (8 mg total) by mouth every 8 (eight) hours as needed for nausea or vomiting.   ondansetron  (ZOFRAN -ODT) 4 MG disintegrating tablet Take 1 tablet (4 mg total) by mouth every 8 (eight) hours as needed for nausea or vomiting.   oxyCODONE  (ROXICODONE ) 5 MG/5ML solution Take 4 mLs (4 mg total) by mouth every 4 (four) hours as needed for moderate pain.   pantoprazole  (PROTONIX ) 40 MG tablet Take 1 tablet by mouth once daily   polyethylene glycol (MIRALAX  / GLYCOLAX ) packet Take 17 g by mouth daily as needed for mild constipation.   potassium chloride  SA (KLOR-CON  M) 20 MEQ tablet Take 1 tablet (20 mEq total) by mouth daily.   predniSONE  (DELTASONE ) 50 MG tablet Take 1 tablet (50 mg total) by mouth as directed.    revefenacin  (YUPELRI ) 175 MCG/3ML nebulizer solution Inhale one vial in nebulizer once daily. Do not mix with other nebulized medications.   sodium chloride  (OCEAN) 0.65 % SOLN nasal spray Place 1 spray into both nostrils as needed for congestion (nose bleeds).   valACYclovir  (VALTREX ) 500 MG tablet Take 500 mg by mouth daily as needed (breakouts).   Physical Exam:   VS:  BP 122/80   Pulse 62   Ht 5' 6 (1.676 m)   Wt 115 lb (52.2 kg)   SpO2 96%   BMI 18.56 kg/m    Wt Readings from Last 3 Encounters:  05/27/24 115 lb (52.2 kg)  05/20/24 109 lb 9.6 oz (49.7 kg)  05/03/24 113 lb 6.4 oz (51.4 kg)   GEN: Cachetic, in no acute distress NECK: No JVD; No carotid bruits CARDIAC: RRR, 2/6 systolic murmur, no rubs or gallops RESPIRATORY:  Decreased lung sounds without rales, wheezing or rhonchi  ABDOMEN: Soft, non-tender, non-distended EXTREMITIES:  No edema; No acute deformity     Asessement and Plan:.    Atypical chest pain/Nonobstructive CAD: Patient with history of atypical chest pain that is fleeting and typically improved with a warm pack.  She recently underwent cardiac catheterization indicating mild nonobstructive CAD. Reviewed ED precautions. Continue Eliquis  2.5 mg twice daily.  Chronic dyspnea/Chronic HFpEF: Patient seen by Dr. Cherrie with advanced heart failure team, felt that dyspnea is multifactorial due to combination of diastolic heart failure, RV failure, significant underlying lung disease and deconditioning.  Hemodynamics on cath did not explain her degree of dyspnea and there is no further room for diuresis.  Today she reports that her breathing has been stable, denies any significant  worsening however does not feel this has improved.  Again reviewed with patient that her dyspnea is multifactorial.  She has started wearing oxygen at night as she has been unable to tolerate CPAP in setting of OSA.  Patient to undergo PFTs and HRCT in January with pulmonology.  She has been  referred to pulmonary rehab, unclear if she would be able to participate at this time given bilateral lower extremity neuropathy.  She appears euvolemic and well compensated on exam today.  Continue Lasix  60 mg daily.  Paroxysmal atrial fibrillation: S/p Maze procedure and left atrial appendage clipping in 01/2020 at time of valve repair.  She is maintaining normal sinus rhythm.  Heart rates well-controlled, she plans to follow-up with A-fib clinic in 2 weeks.  Continue amiodarone  200 mg daily and metoprolol  tartrate 25 mg twice daily. Continue chronic anticoagulation with Eliquis  2.5 mg twice daily, technically patient does not meet criteria for reduced dosing yet.  She will in 07/2024 when she turns 79 however she has had issues with recurrent nosebleeds.  Patient was previously taken off anticoagulation due to this reason but Eliquis  was restarted in 03/2024 due to chronic infarcts noted on brain MRIs.  Continue current dose.  Severe mitral regurgitation s/p MVR: S/p minimally invasive valve repair in 01/2020.  Procedure was complicated by LV free wall laceration which required sternotomy with direct repair.  Echo in 04/2024 indicated EF 55 to 60%, RV markedly dilated, moderate HK, severe TR RVSP 75, trivial MR/MV gradient 2.0 mmHg.  MV gradient was elevated on cath however Dr. Pietro and Dr. Cherrie have reviewed TTE and feel MV was okay and did not require TEE.  Hypertension: Blood pressure today 122/80.  Continue current antihypertensive regimen.  Hyperlipidemia: Lipid panel in 03/2024 indicated total cholesterol 169, triglycerides 113, HDL 73, LDL 73.  LDL goal less than 70 given CAD and CVA.  She has declined statin medications.  Dizziness: Patient with history of persistent dizziness which has been intermittent.  She has been worked up by neurology and ENT. Not felt to be cardiac.  No additional cardiac workup necessary at this time.  OSA: Patient has declined CPAP in the past.  Followed by  pulmonology, patient with recent overnight pulse oximetry, she has been started on oxygen therapy overnight.  Disposition: F/u with Dr. Pietro in three months or sooner if needed.   Signed, Veva Grimley D Lorella Gomez, NP

## 2024-05-27 ENCOUNTER — Encounter: Payer: Self-pay | Admitting: Cardiology

## 2024-05-27 ENCOUNTER — Ambulatory Visit: Attending: Cardiology | Admitting: Cardiology

## 2024-05-27 VITALS — BP 122/80 | HR 62 | Ht 66.0 in | Wt 115.0 lb

## 2024-05-27 DIAGNOSIS — I480 Paroxysmal atrial fibrillation: Secondary | ICD-10-CM | POA: Insufficient documentation

## 2024-05-27 DIAGNOSIS — Z9889 Other specified postprocedural states: Secondary | ICD-10-CM | POA: Diagnosis not present

## 2024-05-27 DIAGNOSIS — I2510 Atherosclerotic heart disease of native coronary artery without angina pectoris: Secondary | ICD-10-CM | POA: Diagnosis not present

## 2024-05-27 DIAGNOSIS — I10 Essential (primary) hypertension: Secondary | ICD-10-CM | POA: Diagnosis not present

## 2024-05-27 DIAGNOSIS — G4733 Obstructive sleep apnea (adult) (pediatric): Secondary | ICD-10-CM | POA: Insufficient documentation

## 2024-05-27 DIAGNOSIS — R0609 Other forms of dyspnea: Secondary | ICD-10-CM | POA: Diagnosis not present

## 2024-05-27 DIAGNOSIS — E782 Mixed hyperlipidemia: Secondary | ICD-10-CM | POA: Insufficient documentation

## 2024-05-27 DIAGNOSIS — I5032 Chronic diastolic (congestive) heart failure: Secondary | ICD-10-CM | POA: Insufficient documentation

## 2024-05-27 DIAGNOSIS — R0789 Other chest pain: Secondary | ICD-10-CM | POA: Insufficient documentation

## 2024-05-27 NOTE — Patient Instructions (Signed)
 Thank you for choosing [facility]!     Medication Instructions:  No medication changes were made during today's visit.  *If you need a refill on your cardiac medications before your next appointment, please call your pharmacy*   Lab Work: No labs were ordered during today's visit.  If you have labs (blood work) drawn today and your tests are completely normal, you will receive your results only by: MyChart Message (if you have MyChart) OR A paper copy in the mail If you have any lab test that is abnormal or we need to change your treatment, we will call you to review the results.   Testing/Procedures: No procedures were ordered during today's visit.   Your next appointment:   3 month(s)   Provider:   Redell Shallow, MD     Follow-Up: At [facility], you and your health needs are our priority.  As part of our continuing mission to provide you with exceptional heart care, we have created designated Provider Care Teams.  These Care Teams include your primary Cardiologist (physician) and Advanced Practice Providers (APPs -  Physician Assistants and Nurse Practitioners) who all work together to provide you with the care you need, when you need it. We recommend signing up for the patient portal called MyChart.  Sign up information is provided on this After Visit Summary.  MyChart is used to connect with patients for Virtual Visits (Telemedicine).  Patients are able to view lab/test results, encounter notes, upcoming appointments, etc.  Non-urgent messages can be sent to your provider as well.   To learn more about what you can do with MyChart, go to [email or web address].

## 2024-05-29 ENCOUNTER — Other Ambulatory Visit: Payer: Self-pay | Admitting: Family Medicine

## 2024-05-30 ENCOUNTER — Telehealth (HOSPITAL_COMMUNITY): Payer: Self-pay

## 2024-05-30 ENCOUNTER — Ambulatory Visit: Admitting: Neurology

## 2024-05-30 DIAGNOSIS — I872 Venous insufficiency (chronic) (peripheral): Secondary | ICD-10-CM | POA: Diagnosis not present

## 2024-05-30 DIAGNOSIS — M21621 Bunionette of right foot: Secondary | ICD-10-CM | POA: Diagnosis not present

## 2024-05-30 DIAGNOSIS — M7742 Metatarsalgia, left foot: Secondary | ICD-10-CM | POA: Diagnosis not present

## 2024-05-30 DIAGNOSIS — M2011 Hallux valgus (acquired), right foot: Secondary | ICD-10-CM | POA: Diagnosis not present

## 2024-05-30 DIAGNOSIS — M2012 Hallux valgus (acquired), left foot: Secondary | ICD-10-CM | POA: Diagnosis not present

## 2024-05-30 DIAGNOSIS — M21622 Bunionette of left foot: Secondary | ICD-10-CM | POA: Diagnosis not present

## 2024-05-30 DIAGNOSIS — M7741 Metatarsalgia, right foot: Secondary | ICD-10-CM | POA: Diagnosis not present

## 2024-05-30 DIAGNOSIS — L84 Corns and callosities: Secondary | ICD-10-CM | POA: Diagnosis not present

## 2024-05-30 DIAGNOSIS — M2041 Other hammer toe(s) (acquired), right foot: Secondary | ICD-10-CM | POA: Diagnosis not present

## 2024-05-30 DIAGNOSIS — M2042 Other hammer toe(s) (acquired), left foot: Secondary | ICD-10-CM | POA: Diagnosis not present

## 2024-05-30 DIAGNOSIS — I739 Peripheral vascular disease, unspecified: Secondary | ICD-10-CM | POA: Diagnosis not present

## 2024-05-30 NOTE — Telephone Encounter (Signed)
 Attempted to contact pt about PR program.No answer. Left VM.

## 2024-05-30 NOTE — Telephone Encounter (Signed)
 Pulmonary rehab?

## 2024-05-31 ENCOUNTER — Telehealth: Payer: Self-pay | Admitting: *Deleted

## 2024-05-31 NOTE — Telephone Encounter (Signed)
 How would you like to proceed? Pt scheduled for f/u 08/02/24 with JD. 05/03/24  ov with TP She reports shortness of breath, especially when walking. A deconditioned state and muscle weakness from back issues and heart problems may worsen symptoms. Will hold Yupelri  for now due to adverse effects and lack of  perceived benefit.  Provide a flutter valve and instruct on its use once daily. Use Xopenex  inhaler as needed for dyspnea.   Check PFT on return , Consider HRCT chest on return.    Copied from CRM #[id number]. Topic: Clinical - Request for Lab/Test Order >> May 31, 2024 10:11 AM Devaughn RAMAN wrote: Reason for CRM: Pt is calling regarding an xray. Pt stated her cardiologist recommended her Pulmonologist get an Xray of her lungs. Pt stated her Cardiologist advised her heart is fine but recommended an xray of her lungs.

## 2024-05-31 NOTE — Telephone Encounter (Signed)
 Returned call to patient she says Dr. Bettyjane recommended a cxr of heart.   She reports shortness of breath, especially when walking. A deconditioned state and muscle weakness from back issues and heart problems may worsen symptoms. Will hold Yupelri  for now due to adverse effects and lack of  perceived benefit.  Provide a flutter valve and instruct on its use once daily. Use Xopenex  inhaler as needed for dyspnea. Check PFT on return , Consider HRCT chest on return.

## 2024-06-01 NOTE — Telephone Encounter (Signed)
 We can consider a chest x-ray at your follow up visit.  Dr. Kara

## 2024-06-04 ENCOUNTER — Other Ambulatory Visit: Payer: Self-pay | Admitting: Pulmonary Disease

## 2024-06-06 NOTE — Telephone Encounter (Signed)
 Spoke with pt [facility] and scheduled for PFT

## 2024-06-07 ENCOUNTER — Telehealth: Payer: Self-pay

## 2024-06-07 ENCOUNTER — Ambulatory Visit (HOSPITAL_COMMUNITY)
Admission: RE | Admit: 2024-06-07 | Discharge: 2024-06-07 | Disposition: A | Discharge status: Home / Self Care | Source: Ambulatory Visit | Attending: Internal Medicine | Admitting: Internal Medicine

## 2024-06-07 ENCOUNTER — Encounter (HOSPITAL_COMMUNITY): Payer: Self-pay | Admitting: Internal Medicine

## 2024-06-07 VITALS — BP 114/70 | HR 58 | Ht 66.0 in | Wt 113.2 lb

## 2024-06-07 DIAGNOSIS — D6869 Other thrombophilia: Secondary | ICD-10-CM | POA: Insufficient documentation

## 2024-06-07 DIAGNOSIS — Z5181 Encounter for therapeutic drug level monitoring: Secondary | ICD-10-CM | POA: Diagnosis not present

## 2024-06-07 DIAGNOSIS — Z79899 Other long term (current) drug therapy: Secondary | ICD-10-CM | POA: Insufficient documentation

## 2024-06-07 DIAGNOSIS — I480 Paroxysmal atrial fibrillation: Secondary | ICD-10-CM | POA: Insufficient documentation

## 2024-06-07 NOTE — Telephone Encounter (Signed)
Paperwork faxed to [facility]

## 2024-06-07 NOTE — Progress Notes (Signed)
 Primary Care Physician: Regina Comer BRAVO, MD Primary Cardiologist: Regina Shallow, MD Electrophysiologist: Regina Gladis Norton, MD     Referring Physician: Dr. Shallow Distance [name] is a 78 y.o. female with a history of chronic diastolic CHF, s/p MVR, HLD, HTN, chronic dyspnea, and atrial fibrillation who presents for consultation in the [facility]. Patient contacted clinic on 5/19 noting SOB and pain in chest since DCCV on 10/27/23. Review of notes show chronic history of dyspnea noted by Dr. Shallow in OV 09/23/23 on diuretic. She is taking amiodarone  200 mg daily. Patient is on Eliquis  5 mg BID for a CHADS2VASC score of 5.  On follow-up 06/07/2024, patient is here for amiodarone  surveillance.  She is currently in NSR.  She reports overall very low A-fib burden since last office visit.  She is taking amiodarone  200 mg daily.  No bleeding issues on Eliquis .  Today, she denies symptoms of palpitations, chest pain, shortness of breath, orthopnea, PND, lower extremity edema, dizziness, presyncope, syncope, snoring, daytime somnolence, bleeding, or neurologic sequela. The patient is tolerating medications without difficulties and is otherwise without complaint today.   she has a BMI of Body mass index is 18.27 kg/m.[name] Filed Weights   06/07/24 1419  Weight: 51.3 kg     Current Outpatient Medications  Medication Sig Dispense Refill   acetaminophen  (TYLENOL ) 500 MG tablet Take 500 mg by mouth every 6 (six) hours as needed (pain.).     amiodarone  (PACERONE ) 200 MG tablet Take 200 mg by mouth daily.     apixaban  (ELIQUIS ) 2.5 MG TABS tablet Take 1 tablet (2.5 mg total) by mouth 2 (two) times daily. 60 tablet 11   Calcium  Carbonate Antacid (TUMS CHEWY BITES PO) Take 1 tablet by mouth daily as needed (reflux).      Calcium -Cholecalciferol-Zinc (VIACTIV CALCIUM  IMMUNE) 650-20-5.5 MG-MCG-MG CHEW Chew 1 tablet by mouth daily.     Carboxymethylcellul-Glycerin   (LUBRICATING EYE DROPS OP) Place 1 drop into both eyes 3 (three) times daily as needed (dry/irritated eys.).     cephALEXin  (KEFLEX ) 500 MG capsule Rake all 4 tablets 1 hour prior to dental procedure 4 capsule 6   Cholecalciferol (VITAMIN D3) 50 MCG (2000 UT) capsule Take 2,000 Units by mouth daily.     clonazePAM  (KLONOPIN ) 0.5 MG tablet Take 0.5 tablets (0.25 mg total) by mouth 2 (two) times daily as needed for anxiety. 30 tablet 3   cyanocobalamin  (VITAMIN B12) 1000 MCG/ML injection Inject 1,000 mcg into the muscle every 30 (thirty) days.     denosumab  (PROLIA ) 60 MG/ML SOSY injection Inject 60 mg into the skin every 6 (six) months.     diphenhydrAMINE  (BENADRYL ) 50 MG tablet Take 1 tablet (50 mg total) by mouth as directed. 1 tablet 0   furosemide  (LASIX ) 40 MG tablet Take 1.5 tablets (60 mg total) by mouth daily. 45 tablet 2   levalbuterol  (XOPENEX  HFA) 45 MCG/ACT inhaler Inhale 2 puffs into the lungs every 4 (four) hours as needed for wheezing. 1 each 6   levothyroxine  (SYNTHROID ) 75 MCG tablet Take 1 tablet (75 mcg total) by mouth daily. 90 tablet 0   loratadine (CLARITIN) 10 MG tablet Take 10 mg by mouth in the morning.     meclizine  (ANTIVERT ) 25 MG tablet TAKE 1 TABLET BY MOUTH THREE TIMES DAILY AS NEEDED FOR DIZZINESS 45 tablet 0   metoprolol  tartrate (LOPRESSOR ) 25 MG tablet Take 1 tablet by mouth twice daily 180 tablet 3  mupirocin ointment (BACTROBAN) 2 % Place 1 Application into the nose.     Naloxone HCl 0.4 MG/ML SOCT once as needed.     nystatin  (MYCOSTATIN /NYSTOP ) powder Apply topically 2 (two) times daily as needed (skin irritation (under breasts)). 15 g 0   ondansetron  (ZOFRAN ) 8 MG tablet Take 1 tablet (8 mg total) by mouth every 8 (eight) hours as needed for nausea or vomiting. 45 tablet 1   ondansetron  (ZOFRAN -ODT) 4 MG disintegrating tablet Take 1 tablet (4 mg total) by mouth every 8 (eight) hours as needed for nausea or vomiting. 20 tablet 0   oxyCODONE  (ROXICODONE ) 5  MG/5ML solution Take 4 mLs (4 mg total) by mouth every 4 (four) hours as needed for moderate pain.  0   pantoprazole  (PROTONIX ) 40 MG tablet Take 1 tablet by mouth once daily 30 tablet 0   polyethylene glycol (MIRALAX  / GLYCOLAX ) packet Take 17 g by mouth daily as needed for mild constipation.     potassium chloride  SA (KLOR-CON  M) 20 MEQ tablet Take 1 tablet (20 mEq total) by mouth daily. 30 tablet 2   predniSONE  (DELTASONE ) 50 MG tablet Take 1 tablet (50 mg total) by mouth as directed. 3 tablet 0   revefenacin  (YUPELRI ) 175 MCG/3ML nebulizer solution Inhale one vial in nebulizer once daily. Do not mix with other nebulized medications. 3 mL 11   sodium chloride  (OCEAN) 0.65 % SOLN nasal spray Place 1 spray into both nostrils as needed for congestion (nose bleeds).     valACYclovir  (VALTREX ) 500 MG tablet Take 500 mg by mouth daily as needed (breakouts).     No current facility-administered medications for this encounter.    Atrial Fibrillation Management history:  Previous antiarrhythmic drugs: amiodarone  Previous cardioversions: 10/27/23 Previous ablations: none Anticoagulation history: Eliquis    ROS- All systems are reviewed and negative except as per the HPI above.  Physical Exam: BP 114/70   Pulse (!) 58   Ht 5' 6 (1.676 m)   Wt 51.3 kg   BMI 18.27 kg/m   GEN- The patient is well appearing, alert and oriented x 3 today.   Neck - no JVD or carotid bruit noted Lungs- Clear to ausculation bilaterally, normal work of breathing Heart- Regular rate and rhythm, no murmurs, rubs or gallops, PMI not laterally displaced Extremities- no clubbing, cyanosis, or edema Skin - no rash or ecchymosis noted   EKG today demonstrates  EKG Interpretation Date/Time:  Tuesday June 07 2024 14:23:06 EST Ventricular Rate:  58 PR Interval:  182 QRS Duration:  74 QT Interval:  474 QTC Calculation: 466 R Axis:   113  Text Interpretation: Sinus bradycardia When compared with ECG of  08-Apr-2024 15:24, PREVIOUS ECG IS PRESENT Reconfirmed by Terra Pac [phone]) on 06/07/2024 2:47:13 PM    Echo 04/13/2024: 1. Left ventricular ejection fraction, by estimation, is 55 to 60%. The  left ventricle has normal function. The left ventricle has no regional  wall motion abnormalities. Left ventricular diastolic parameters are  indeterminate. There is the  interventricular septum is flattened in systole and diastole, consistent  with right ventricular pressure and volume overload.   2. Right ventricular systolic function is moderately reduced. The right  ventricular size is moderately enlarged. There is severely elevated  pulmonary artery systolic pressure. The estimated right ventricular  systolic pressure is 75.5 mmHg.   3. Right atrial size was severely dilated.   4. The mitral valve has been repaired/replaced. Trivial mitral valve  regurgitation. No evidence of mitral stenosis.  The mean mitral valve  gradient is 2.0 mmHg. Echo findings are consistent with normal structure  and function of the mitral valve  prosthesis.   5. The tricuspid valve is abnormal. Tricuspid valve regurgitation is  severe.   6. The aortic valve is normal in structure. There is mild calcification  of the aortic valve. Aortic valve regurgitation is mild. No aortic  stenosis is present.   7. 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   8. The inferior vena cava is dilated in size with <50% respiratory  variability, suggesting right atrial pressure of 15 mmHg.    ASSESSMENT & PLAN CHA2DS2-VASc Score = 5  The patient's score is based upon: CHF History: 1 HTN History: 1 Diabetes History: 0 Stroke History: 0 Vascular Disease History: 0 Age Score: 2 Gender Score: 1       ASSESSMENT AND PLAN: Paroxysmal Atrial Fibrillation (ICD10:  I48.0) The patient's CHA2DS2-VASc score is 5, indicating a 7.2% annual risk of stroke.    Patient is currently in NSR.  Continue Lopressor  25 mg twice daily.  High  risk medication monitoring (ICD10: [id number]) Patient requires ongoing monitoring for anti-arrhythmic medication which has the potential to cause life threatening arrhythmias or AV block. Qtc stable. Continue amiodarone  200 mg daily. Cmet drawn today.  TSH from September is normal.  Secondary Hypercoagulable State (ICD10:  D68.69) The patient is at significant risk for stroke/thromboembolism based upon her CHA2DS2-VASc Score of 5.  Continue Apixaban  (Eliquis ).  No missed doses.     Follow up 6 months for amiodarone  surveillance.    Terra Pac, PA-C  [facility] [address] [place], [place] [address] [phone]

## 2024-06-08 ENCOUNTER — Telehealth (HOSPITAL_COMMUNITY): Payer: Self-pay

## 2024-06-08 ENCOUNTER — Ambulatory Visit (HOSPITAL_COMMUNITY): Payer: Self-pay | Admitting: Internal Medicine

## 2024-06-08 DIAGNOSIS — Z01818 Encounter for other preprocedural examination: Secondary | ICD-10-CM | POA: Diagnosis not present

## 2024-06-08 DIAGNOSIS — Z7901 Long term (current) use of anticoagulants: Secondary | ICD-10-CM | POA: Diagnosis not present

## 2024-06-08 DIAGNOSIS — H25813 Combined forms of age-related cataract, bilateral: Secondary | ICD-10-CM | POA: Diagnosis not present

## 2024-06-08 DIAGNOSIS — Z9229 Personal history of other drug therapy: Secondary | ICD-10-CM | POA: Diagnosis not present

## 2024-06-08 LAB — COMPREHENSIVE METABOLIC PANEL WITH GFR
ALT: 24 IU/L (ref 0–32)
AST: 28 IU/L (ref 0–40)
Albumin: 3.7 g/dL — ABNORMAL LOW (ref 3.8–4.8)
Alkaline Phosphatase: 92 IU/L (ref 49–135)
BUN/Creatinine Ratio: 23 (ref 12–28)
BUN: 23 mg/dL (ref 8–27)
Bilirubin Total: 0.6 mg/dL (ref 0.0–1.2)
CO2: 32 mmol/L — ABNORMAL HIGH (ref 20–29)
Calcium: 9.4 mg/dL (ref 8.7–10.3)
Chloride: 98 mmol/L (ref 96–106)
Creatinine, Ser: 1.01 mg/dL — ABNORMAL HIGH (ref 0.57–1.00)
Globulin, Total: 2.7 g/dL (ref 1.5–4.5)
Glucose: 96 mg/dL (ref 70–99)
Potassium: 4.1 mmol/L (ref 3.5–5.2)
Sodium: 146 mmol/L — ABNORMAL HIGH (ref 134–144)
Total Protein: 6.4 g/dL (ref 6.0–8.5)
eGFR: 57 mL/min/1.73 — ABNORMAL LOW (ref >59)

## 2024-06-08 NOTE — Telephone Encounter (Signed)
 Attempted f/u call to schedule pulmonary rehab- no answer, left message.  Closing referral.

## 2024-06-09 NOTE — Telephone Encounter (Signed)
 Kara Dorn NOVAK, MD    06/01/24  4:52 PM Note We can consider a chest x-ray at your follow up visit.   Dr. Kara

## 2024-06-15 ENCOUNTER — Ambulatory Visit: Payer: Self-pay | Admitting: Internal Medicine

## 2024-06-15 NOTE — Telephone Encounter (Signed)
 FYI Only or Action Required?: Action required by provider: request for appointment.  Patient is followed in Pulmonology for chronic dyspnea, last seen on 05/03/2024 by [name], [name], NP.  Called Nurse Triage reporting Breathing Problem.  Symptoms began several years ago.  Interventions attempted: Other: o2, albuterol .  Symptoms are: unchanged.  Triage Disposition: See PCP Within 2 Weeks  Patient/caregiver understands and will follow disposition?: Yes  FYI Only or Action Required?: Action required by provider: request for appointment.  Patient was last seen in primary care on 03/25/2024 by Regina Comer BRAVO, MD.  Called Nurse Triage reporting Breathing Problem.  Symptoms began several years ago.  Interventions attempted: Prescription medications: oxycodone  and tylenol .  Symptoms are: unchanged.  Triage Disposition: See PCP Within 2 Weeks  Patient/caregiver understands and will follow disposition?: Yes  Copied from CRM #[id number]. Topic: Clinical - Red Word Triage >> Jun 15, 2024  4:35 PM [name] wrote: Red Word that prompted transfer to Nurse Triage: Difficulty breathing Reason for Disposition  [1] MILD longstanding difficulty breathing (e.g., minimal/no SOB at rest, SOB with walking, pulse < 100) AND [2] SAME as normal  Answer Assessment - Initial Assessment Questions Uses albuterol . SOB and Chest pain radiates to back. History of back pain. States pain is the same as always. States she has mentioned this to doctors many times. Takes tylenol  and oxycodone  . Has had 7 back surgeries since 2014. Unable to schedule sooner appointment but patient requesting appointment from Dr Kara. 1. RESPIRATORY STATUS: Describe your breathing? (e.g., wheezing, shortness of breath, unable to speak, severe coughing)      Breathing difficulties vary day to day  2. ONSET: When did this breathing problem begin?      Continuous problem. 3. PATTERN Does the difficult breathing come and  go, or has it been constant since it started?      SOB with exertion  4. SEVERITY: How bad is your breathing? (e.g., mild, moderate, severe)      Patient unable to answer though ask several times.  5. RECURRENT SYMPTOM: Have you had difficulty breathing before? If Yes, ask: When was the last time? and What happened that time?      States she has been having this issue.  6. CARDIAC HISTORY: Do you have any history of heart disease? (e.g., heart attack, angina, bypass surgery, angioplasty)      Valve replacement, HTN 7. LUNG HISTORY: Do you have any history of lung disease?  (e.g., pulmonary embolus, asthma, emphysema)     Chronic dyspenia 8. CAUSE: What do you think is causing the breathing problem?      Constant issue. 9. OTHER SYMPTOMS: Do you have any other symptoms? (e.g., chest pain, cough, dizziness, fever, runny nose)     Chest pain 10. O2 SATURATION MONITOR:  Do you use an oxygen  saturation monitor (pulse oximeter) at home? If Yes, ask: What is your reading (oxygen  level) today? What is your usual oxygen  saturation reading? (e.g., 95%)       2L, not monitoring O2 but encouraged patient to find her pulse O2 to check and advised it should be over 90%  Protocols used: Breathing Difficulty-A-AH

## 2024-06-17 NOTE — Telephone Encounter (Signed)
 I called and spoke to pt. Pt states she will leave her appt as is. NFN

## 2024-06-20 ENCOUNTER — Ambulatory Visit: Admitting: Family Medicine

## 2024-06-25 ENCOUNTER — Other Ambulatory Visit: Payer: Self-pay | Admitting: Adult Health

## 2024-06-27 MED ORDER — FUROSEMIDE 40 MG PO TABS: 60.0000 mg | ORAL_TABLET | Freq: Every day | ORAL | 3 refills | Status: AC

## 2024-06-29 ENCOUNTER — Encounter: Payer: Self-pay | Admitting: Family Medicine

## 2024-06-29 ENCOUNTER — Ambulatory Visit: Admitting: Family Medicine

## 2024-06-29 VITALS — BP 98/50 | HR 60 | Temp 98.1°F | Resp 14 | Ht 66.0 in | Wt 113.0 lb

## 2024-06-29 DIAGNOSIS — R531 Weakness: Secondary | ICD-10-CM | POA: Diagnosis not present

## 2024-06-29 DIAGNOSIS — F419 Anxiety disorder, unspecified: Secondary | ICD-10-CM | POA: Diagnosis not present

## 2024-06-29 DIAGNOSIS — R2681 Unsteadiness on feet: Secondary | ICD-10-CM | POA: Diagnosis not present

## 2024-06-29 DIAGNOSIS — M5417 Radiculopathy, lumbosacral region: Secondary | ICD-10-CM | POA: Insufficient documentation

## 2024-06-29 DIAGNOSIS — F32A Depression, unspecified: Secondary | ICD-10-CM | POA: Diagnosis not present

## 2024-06-29 DIAGNOSIS — G2581 Restless legs syndrome: Secondary | ICD-10-CM | POA: Insufficient documentation

## 2024-06-29 DIAGNOSIS — I959 Hypotension, unspecified: Secondary | ICD-10-CM | POA: Diagnosis not present

## 2024-06-29 LAB — TSH: TSH: 3.92 u[IU]/mL (ref 0.35–5.50)

## 2024-06-29 LAB — CBC WITH DIFFERENTIAL/PLATELET
Basophils Absolute: 0 K/uL (ref 0.0–0.1)
Basophils Relative: 0.3 % (ref 0.0–3.0)
Eosinophils Absolute: 0.1 K/uL (ref 0.0–0.7)
Eosinophils Relative: 0.7 % (ref 0.0–5.0)
HCT: 38.3 % (ref 36.0–46.0)
Hemoglobin: 12.4 g/dL (ref 12.0–15.0)
Lymphocytes Relative: 10.1 % — ABNORMAL LOW (ref 12.0–46.0)
Lymphs Abs: 0.7 K/uL (ref 0.7–4.0)
MCHC: 32.4 g/dL (ref 30.0–36.0)
MCV: 91.7 fl (ref 78.0–100.0)
Monocytes Absolute: 0.7 K/uL (ref 0.1–1.0)
Monocytes Relative: 9.1 % (ref 3.0–12.0)
Neutro Abs: 5.8 K/uL (ref 1.4–7.7)
Neutrophils Relative %: 79.8 % — ABNORMAL HIGH (ref 43.0–77.0)
Platelets: 186 K/uL (ref 150.0–400.0)
RBC: 4.18 Mil/uL (ref 3.87–5.11)
RDW: 15.5 % (ref 11.5–15.5)
WBC: 7.3 K/uL (ref 4.0–10.5)

## 2024-06-29 LAB — HEPATIC FUNCTION PANEL
ALT: 17 U/L (ref 3–35)
AST: 23 U/L (ref 5–37)
Albumin: 3.8 g/dL (ref 3.5–5.2)
Alkaline Phosphatase: 93 U/L (ref 39–117)
Bilirubin, Direct: 0.2 mg/dL (ref 0.1–0.3)
Total Bilirubin: 0.8 mg/dL (ref 0.2–1.2)
Total Protein: 6.7 g/dL (ref 6.0–8.3)

## 2024-06-29 LAB — BASIC METABOLIC PANEL WITH GFR
BUN: 31 mg/dL — ABNORMAL HIGH (ref 6–23)
CO2: 40 meq/L — ABNORMAL HIGH (ref 19–32)
Calcium: 9.5 mg/dL (ref 8.4–10.5)
Chloride: 95 meq/L — ABNORMAL LOW (ref 96–112)
Creatinine, Ser: 1.12 mg/dL (ref 0.40–1.20)
GFR: 46.64 mL/min — ABNORMAL LOW
Glucose, Bld: 88 mg/dL (ref 70–99)
Potassium: 4.2 meq/L (ref 3.5–5.1)
Sodium: 143 meq/L (ref 135–145)

## 2024-06-29 NOTE — Patient Instructions (Signed)
 Follow up in 3 weeks to recheck blood pressure and heart rate We'll notify you of your lab results and make any changes if needed Home Health will call you to schedule a time to come out and work with you DECREASE the Metoprolol  to 1/2 tab twice daily Make sure you are eating and drinking regularly to provide your body fuel to get up and go Call with any questions or concerns Stay Safe! Hang in there! Happy Holidays!

## 2024-06-29 NOTE — Progress Notes (Signed)
" ° °  Subjective:    Patient ID: [name], female    DOB: 10-03-1945, 78 y.o.   MRN: [id number]  HPI Weakness- 'i just feel so bad, I can't do anything'.  States she is too weak to walk through the store, has to have someone with her to drive or go out.  States legs are weak and heavy.  Ambulates w/ a cane.  Pulmonary put her on O2 at night.  Has felt like she is on the brink of Afib but denies irregular HR.  Say 'arms and legs are jumping'- 'i got nerves'.  Pt reports she is sleeping more than usual and 'i'm afraid to go to bed at night b/c I'm afraid I won't wake up'.   Review of Systems For ROS see HPI     Objective:   Physical Exam Vitals reviewed.  Constitutional:      General: She is not in acute distress.    Comments: Frail elderly woman who appears at baseline  HENT:     Head: Normocephalic and atraumatic.  Eyes:     Extraocular Movements: Extraocular movements intact.     Conjunctiva/sclera: Conjunctivae normal.  Cardiovascular:     Rate and Rhythm: Normal rate and regular rhythm.     Pulses: Normal pulses.  Pulmonary:     Effort: Pulmonary effort is normal. No respiratory distress.     Breath sounds: No wheezing or rhonchi.  Lymphadenopathy:     Cervical: No cervical adenopathy.  Neurological:     Mental Status: She is alert. Mental status is at baseline.     Cranial Nerves: No cranial nerve deficit.  Psychiatric:     Comments: Very anxious, tearful           Assessment & Plan:  Weakness w/ unsteadiness on feet- ongoing issue for pt.  She doesn't seem to be any different from her usual state of health but she feels that things are much worse.  States she is not able to do anything with her day due to her fatigue and feeling that her legs are heavy.  She is very anxious and tearful when talking today.  Reviewed recent specialist notes that indicated her medical situation was unchanged.  Again, I feel her anxiety (and deconditioning) have a lot to do with her  physical sxs.  Will order HH to evaluate and hopefully work with her on strength and endurance.  Check labs to r/o underlying causes.  In the meantime, will decrease Metoprolol  to 1/2 tab BID due to hypotension.  Pt expressed understanding and is in agreement w/ plan.   Hypotension- new.  May be contributing to her excess feeling of fatigue.  Will decrease Metoprolol  to 1/2 tab BID and make cardiology aware.  "

## 2024-07-01 ENCOUNTER — Ambulatory Visit: Payer: Self-pay | Admitting: Family Medicine

## 2024-07-01 ENCOUNTER — Telehealth: Payer: Self-pay | Admitting: Cardiology

## 2024-07-01 DIAGNOSIS — R7981 Abnormal blood-gas level: Secondary | ICD-10-CM

## 2024-07-01 NOTE — Telephone Encounter (Signed)
 Called and spoke to pt. She saw her PCP on 06/29/24 for c/o nausea, dizziness, sick on my stomach. Her PCP advised cutting the Metoprolol  Tartrate in half (to equal 12.5 mg BID). BP at that OV was 98/50 and HR 60.   All cardiac meds up to date in Epic. The lasix  was changed to 60 mg qday not too long ago. She can not tell a difference in the dizziness when taking Meclizine . She wears oxygen  2.5 Liters during the night (not all night long, amount of time that she wears it varies from night to night).   Advised pt to follow PCP's advice for now and if Dr. Pietro or Devora, NP had any different rec's, we would f/u with her. Offered to see Dr. Pietro sooner at the [facility], but pt declined. Will see him on 09/26/24 at the [place] location. Did not offer any appt with West, NP as pt seemed overwhelmed with current amt of appointments and tests that she is scheduled for. She will call after hours triage if needed over the weekend.

## 2024-07-01 NOTE — Telephone Encounter (Signed)
 Pt c/o medication issue:  1. Name of Medication: metoprolol  tartrate (LOPRESSOR ) 25 MG tablet   2. How are you currently taking this medication (dosage and times per day)? As written   3. Are you having a reaction (difficulty breathing--STAT)?   4. What is your medication issue? Pt is refusing to give me any info, she only wants to speak with medical staff. States the prescription has changed but would not elaborate.

## 2024-07-01 NOTE — Progress Notes (Signed)
 Called patient and she wants to get her blood drawn at [facility]. I told her that she will need to come here to get her labs drawn. She said that its too far for her and making the drive here makes her nervous. I told her I am sorry but its policy that if we order labs, she will need to have them drawn here. She verbalized understanding.

## 2024-07-01 NOTE — Telephone Encounter (Unsigned)
 Copied from CRM #[id number]. Topic: General - Other >> Jul 01, 2024  9:50 AM Thersia BROCKS wrote: Reason for CRM: Patient called in regarding a question for [facility] , would like a callback

## 2024-07-01 NOTE — Progress Notes (Signed)
 Future labs ordered  Need to schedule lab visit only in 1-2 weeks

## 2024-07-01 NOTE — Progress Notes (Signed)
 Called patient and scheduled lab visit  Patient understood lab results

## 2024-07-03 NOTE — Assessment & Plan Note (Signed)
 Ongoing issue for pt.  Sxs are severe and interfere with her daily activities.  During multiple visits over the last year she has perseverated on end of life, feeling that she is nearing the end, but not wanting to give up.  This is a contradiction to multiple specialist notes which indicate her chronic medical issues are stable.  She remains on Clonazepam  as she has been intolerant (real or imagined) to nearly all anxiety medications.  Again encouraged her to resume counseling.  She says she hopes to once her energy level improves.  Will follow.

## 2024-07-06 ENCOUNTER — Ambulatory Visit: Admitting: Family Medicine

## 2024-07-07 ENCOUNTER — Other Ambulatory Visit: Payer: Self-pay | Admitting: Adult Health

## 2024-07-07 ENCOUNTER — Other Ambulatory Visit: Payer: Self-pay | Admitting: Cardiology

## 2024-07-07 DIAGNOSIS — R0602 Shortness of breath: Secondary | ICD-10-CM

## 2024-07-08 ENCOUNTER — Other Ambulatory Visit (INDEPENDENT_AMBULATORY_CARE_PROVIDER_SITE_OTHER)

## 2024-07-08 ENCOUNTER — Other Ambulatory Visit: Payer: Self-pay | Admitting: Pulmonary Disease

## 2024-07-08 DIAGNOSIS — R7981 Abnormal blood-gas level: Secondary | ICD-10-CM | POA: Diagnosis not present

## 2024-07-08 LAB — BASIC METABOLIC PANEL WITH GFR
BUN: 30 mg/dL — ABNORMAL HIGH (ref 6–23)
CO2: 40 meq/L — ABNORMAL HIGH (ref 19–32)
Calcium: 9.6 mg/dL (ref 8.4–10.5)
Chloride: 95 meq/L — ABNORMAL LOW (ref 96–112)
Creatinine, Ser: 1.02 mg/dL (ref 0.40–1.20)
GFR: 52.17 mL/min — ABNORMAL LOW
Glucose, Bld: 88 mg/dL (ref 70–99)
Potassium: 4.6 meq/L (ref 3.5–5.1)
Sodium: 144 meq/L (ref 135–145)

## 2024-07-11 ENCOUNTER — Ambulatory Visit: Payer: Self-pay | Admitting: Family Medicine

## 2024-07-12 ENCOUNTER — Other Ambulatory Visit: Payer: Self-pay | Admitting: Adult Health

## 2024-07-14 ENCOUNTER — Telehealth: Payer: Self-pay | Admitting: Family Medicine

## 2024-07-14 NOTE — Telephone Encounter (Signed)
 Type of form received: [facility]  Additional comments:   Received by: Fax  Form should be Faxed/mailed to: (address/ fax #) [phone] or [phone]  Is patient requesting call for pickup:  Form placed:  Provider bin  Attach charge sheet.  Provider will determine charge.  Individual made aware of 3-5 business day turn around Yes?

## 2024-07-15 NOTE — Telephone Encounter (Signed)
 Forms signed and returned to [place]

## 2024-07-15 NOTE — Telephone Encounter (Signed)
 Faxed paperwork and scanned to chart

## 2024-07-15 NOTE — Telephone Encounter (Signed)
 Obtained and placed in providers folder for review at nurse station

## 2024-07-21 ENCOUNTER — Telehealth: Payer: Self-pay

## 2024-07-21 NOTE — Telephone Encounter (Signed)
 Copied from CRM [id number]. Topic: Clinical - Home Health Verbal Orders >> Jul 21, 2024  4:09 PM Viola FALCON wrote: Caller/Agency: Nidia from [facility] Number: [phone] Service Requested: Skilled Nursing Frequency: Start date is 07/22/24  Any new concerns about the patient? No, but wants to make sure Dr. Mahlon will be following

## 2024-07-21 NOTE — Telephone Encounter (Signed)
 If palliative, yes I will be following.  If Hospice, I prefer the [facility] physician manages

## 2024-07-22 ENCOUNTER — Ambulatory Visit: Admitting: Family Medicine

## 2024-07-22 ENCOUNTER — Telehealth: Payer: Self-pay | Admitting: *Deleted

## 2024-07-22 ENCOUNTER — Telehealth: Payer: Self-pay

## 2024-07-22 NOTE — Telephone Encounter (Unsigned)
 Copied from CRM [id number]. Topic: General - Other >> Jul 22, 2024 10:00 AM China J wrote: Reason for CRM: Jon calling from [facility] wanting to let Dr. Mahlon know that the patient was supposed to have her admission today but she stated that she pulled her back muscles at the hospital and declined seeing Home Health today. Patient will be seen on Monday instead.  For any questions please call [phone].

## 2024-07-22 NOTE — Telephone Encounter (Signed)
 Called Lauren and provided verbal orders after confirming this is not for Hospice care Lauren will send any paperwork needing stitches

## 2024-07-22 NOTE — Telephone Encounter (Signed)
 Copied from CRM #[id number]. Topic: Appointments - Scheduling Inquiry for Clinic >> Jul 22, 2024 12:01 PM Leila BROCKS wrote: Reason for CRM: Patient [phone] has an appointment for 08/02/24 PFT at 10 am and Dr. Kara at 1:15 pm. Patient states was at [facility] 07/16/24-07/21/24, and wants to see Dr. Kara sooner than 08/02/24 and does she need to have PFT, since there was so many tests done in the hospital. Patient is on oxygen  now and asking if she needs to be on it and that's why patient wants to see Dr. Kara sooner. Please advise and call back.   I called and spoke to pt. Pt stated that the ER put her on o2 24/7 and she was told by our office to only be on it at HS. I informed pt that as of right now, she should use to o2 as instructed by the ER, and when she comes in for her appt, Dr dewald can advise what he wants to do about the o2. I also advised pt to keep her PFT appt and her appt with Dr Kara. Pt did ask for a sooner appt but I informed her that Dr Kara will not be in office until the week of her appt. Pt verbalized understanding. NFN

## 2024-07-22 NOTE — Telephone Encounter (Signed)
 Copied from CRM [id number]. Topic: Clinical - Home Health Verbal Orders >> Jul 21, 2024  4:09 PM Viola FALCON wrote: Caller/Agency: Nidia from [facility] Number: [phone] Service Requested: Skilled Nursing Frequency: Start date is 07/22/24  Any new concerns about the patient? No, but wants to make sure Dr. Mahlon will be following >> Jul 22, 2024 11:06 AM Robinson DEL wrote: Tinnie with [facility] following up on message sent yesterday for verbal order for skilled nursing and wants to make sure Dr. Mahlon is going to oversee patient while in home health care with them. States mainly for wound care  Lauren [phone]

## 2024-07-22 NOTE — Transitions of Care (Post Inpatient/ED Visit) (Signed)
" ° °  07/22/2024  Name: [name] MRN: [id number] DOB: April 20, 1946  Today's TOC FU Call Status: Today's TOC FU Call Status:: Unsuccessful Call (1st Attempt) (Patient answered phone and reports she has people here now taking care of my oxygen - and getting everything straightened out for me- I can't talk right now requests call back on Monday) Unsuccessful Call (1st Attempt) Date: 07/22/24  Attempted to reach the patient regarding the most recent Inpatient visit  Follow Up Plan: Additional outreach attempts will be made to reach the patient to complete the Transitions of Care (Post Inpatient/ED visit) call.   Pls call/ message for questions,  Asami Lambright Mckinney Hardie Veltre, RN, BSN, CCRN Alumnus RN Care Manager  Transitions of Care  [facility] [phone]: direct office  "

## 2024-07-23 ENCOUNTER — Other Ambulatory Visit: Payer: Self-pay | Admitting: Adult Health

## 2024-07-23 ENCOUNTER — Other Ambulatory Visit: Payer: Self-pay | Admitting: Student in an Organized Health Care Education/Training Program

## 2024-07-23 DIAGNOSIS — E785 Hyperlipidemia, unspecified: Secondary | ICD-10-CM

## 2024-07-25 ENCOUNTER — Telehealth: Payer: Self-pay | Admitting: *Deleted

## 2024-07-25 ENCOUNTER — Telehealth: Payer: Self-pay | Admitting: Cardiology

## 2024-07-25 NOTE — Telephone Encounter (Signed)
 Pt called in stating she would like to speak to someone to give her guidance on her most recent hospital visit. She stated she did not wish to go into detail until nurse calls. Please advise.

## 2024-07-25 NOTE — Transitions of Care (Post Inpatient/ED Visit) (Signed)
 "  07/25/2024  Name: [name] MRN: [id number] DOB: August 24, 1945  Today's TOC FU Call Status: Today's TOC FU Call Status:: Successful TOC FU Call Completed TOC FU Call Complete Date: 07/25/24  Patient's Name and Date of Birth confirmed. Name, DOB  Transition Care Management Follow-up Telephone Call Date of Discharge: 07/21/24 Discharge Facility: Other [facility]) Name of Other (Non[facility]) Discharge Facility: [facility] Type of Discharge: Inpatient Admission Primary Inpatient Discharge Diagnosis:: Acute respiratory failure with hypoxia How have you been since you were released from the hospital?: Same (I am just trying to get everything straight; I have a nnurse coming in to help me; I am just really not up for any phone calls right now; I will call Dr. Mahlon if I need anything) Any questions or concerns?:  (unable to determine/ assess- patient declined full TOC call)  Items Reviewed: Did you receive and understand the discharge instructions provided?:  (unable to determine/ assess- patient declined full TOC call) Medications obtained,verified, and reconciled?: No (Patient declined all aspects of medication reconciliation/ review) Medications Not Reviewed Reasons:: Other: (Patient declined) Any new allergies since your discharge?:  (unable to determine/ assess- patient declined full TOC call) Dietary orders reviewed?: No (unable to determine/ assess- patient declined full TOC call) Do you have support at home?: Yes People in Home [RPT]: alone Name of Support/Comfort Primary Source: Reports independent in self-care activities at baseline; reports sister is temporarily residing with her- assists as/ if needed/ indicated  Medications Reviewed Today: Medications Reviewed Today     Reviewed by Glendon Fiser M, RN (Registered Nurse) on 07/25/24 at 1406  Med List Status: <None>   Medication Order Taking? Sig Documenting Provider Last Dose Status Informant  acetaminophen  (TYLENOL ) 500  MG tablet [id number]  Take 500 mg by mouth every 6 (six) hours as needed (pain.). [provider]  Active Self  amiodarone  (PACERONE ) 200 MG tablet [id number]  Take 200 mg by mouth daily. [provider]  Active Self  apixaban  (ELIQUIS ) 2.5 MG TABS tablet [id number]  Take 1 tablet (2.5 mg total) by mouth 2 (two) times daily. Pietro Redell RAMAN, MD  Active Self  Calcium  Carbonate Antacid (TUMS CHEWY BITES PO) [id number]  Take 1 tablet by mouth daily as needed (reflux).  [provider]  Active Self  Calcium -Cholecalciferol-Zinc (VIACTIV CALCIUM  IMMUNE) 650-20-5.5 MG-MCG-MG CHEW [id number]  Chew 1 tablet by mouth daily. [provider]  Active Self  Carboxymethylcellul-Glycerin  (LUBRICATING EYE DROPS OP) [id number]  Place 1 drop into both eyes 3 (three) times daily as needed (dry/irritated eys.). [provider]  Active Self  cephALEXin  (KEFLEX ) 500 MG capsule [id number]  Rake all 4 tablets 1 hour prior to dental procedure Pietro Redell RAMAN, MD  Active Self           Med Note CHRISTIE ALEXANDER   Tue Apr 26, 2024  4:40 PM) Prior to Dental Procedure  Cholecalciferol (VITAMIN D3) 50 MCG (2000 UT) capsule [id number]  Take 2,000 Units by mouth daily. [provider]  Active Self  clonazePAM  (KLONOPIN ) 0.5 MG tablet [id number]  Take 0.5 tablets (0.25 mg total) by mouth 2 (two) times daily as needed for anxiety. Tabori, Katherine E, MD  Active Self  cyanocobalamin  (VITAMIN B12) 1000 MCG/ML injection [id number]  Inject 1,000 mcg into the muscle every 30 (thirty) days. [provider]  Active Self  denosumab  (PROLIA ) 60 MG/ML SOSY injection [id number]  Inject 60 mg into the skin every 6 (six) months. [provider]  Active Self  Med Note JACKOLYN APOLINAR JONELLE Pablo Jun 11, 2020  2:33 PM)    diphenhydrAMINE  (BENADRYL ) 50 MG tablet [id number]  Take 1 tablet (50 mg total) by mouth as directed. Bensimhon, Toribio JONELLE, MD  Active Self           Med Note  CHRISTIE ALEXANDER   Tue Apr 26, 2024  4:46 PM) Prior to procedure  furosemide  (LASIX ) 40 MG tablet [id number]  Take 1.5 tablets (60 mg total) by mouth daily. Pietro Redell RAMAN, MD  Active   levalbuterol  (XOPENEX  HFA) 45 MCG/ACT inhaler [id number]  Inhale 2 puffs into the lungs every 4 (four) hours as needed for wheezing. Kara Dorn NOVAK, MD  Active Self  levothyroxine  (SYNTHROID ) 75 MCG tablet [id number]  Take 1 tablet by mouth once daily [name], MD  Active   loratadine (CLARITIN) 10 MG tablet [id number]  Take 10 mg by mouth in the morning. [provider]  Active Self  meclizine  (ANTIVERT ) 25 MG tablet [id number]  TAKE 1 TABLET BY MOUTH THREE TIMES DAILY AS NEEDED FOR DIZZINESS Tabori, Katherine E, MD  Active   metoprolol  tartrate (LOPRESSOR ) 25 MG tablet [id number]  Take 1 tablet by mouth twice daily Pietro Redell RAMAN, MD  Active   mupirocin ointment (BACTROBAN) 2 % [id number]  Place 1 Application into the nose. [provider]  Active   Naloxone HCl 0.4 MG/ML SOCT [id number]  once as needed. [provider]  Active   nystatin  (MYCOSTATIN /NYSTOP ) powder [id number]  Apply topically 2 (two) times daily as needed (skin irritation (under breasts)). Maurice Sharlet RAMAN, PA-C  Active Self  ondansetron  (ZOFRAN ) 8 MG tablet [id number]  Take 1 tablet (8 mg total) by mouth every 8 (eight) hours as needed for nausea or vomiting. Tabori, Katherine E, MD  Active Self  ondansetron  (ZOFRAN -ODT) 4 MG disintegrating tablet [id number]  Take 1 tablet (4 mg total) by mouth every 8 (eight) hours as needed for nausea or vomiting. Ruthe Cornet, DO  Active Self  oxyCODONE  (ROXICODONE ) 5 MG/5ML solution [id number]  Take 4 mLs (4 mg total) by mouth every 4 (four) hours as needed for moderate pain. Maurice Sharlet RAMAN, PA-C  Active Self           Med Note JACKOLYN APOLINAR JONELLE Pablo Jun 11, 2020  2:33 PM)    pantoprazole  (PROTONIX ) 40 MG tablet [id number]  Take 1 tablet by mouth once daily Dewald,  Jonathan B, MD  Active   polyethylene glycol (MIRALAX  / GLYCOLAX ) packet [id number]  Take 17 g by mouth daily as needed for mild constipation. [provider]  Active Self  potassium chloride  SA (KLOR-CON  M) 20 MEQ tablet [id number]  Take 1 tablet (20 mEq total) by mouth daily. Goodrich, Callie E, PA-C  Active Self  revefenacin  (YUPELRI ) 175 MCG/3ML nebulizer solution [id number]  Inhale one vial in nebulizer once daily. Do not mix with other nebulized medications. Kara Dorn NOVAK, MD  Active Self  sodium chloride  (OCEAN) 0.65 % SOLN nasal spray [id number]  Place 1 spray into both nostrils as needed for congestion (nose bleeds). [provider]  Active Self  valACYclovir  (VALTREX ) 500 MG tablet [id number]  Take 500 mg by mouth daily as needed (breakouts). [provider]  Active Self           Home Care and Equipment/Supplies: Were Home Health Services Ordered?:  (unable to determine/ assess- patient declined full TOC call: from review of EHR: no home health services were ordered at  time of hospital discharge) Any new equipment or medical supplies ordered?:  (unable to determine/ assess- patient declined full TOC call)  Functional Questionnaire: Do you need assistance with bathing/showering or dressing?:  (unable to determine/ assess- patient declined full TOC call) Do you need assistance with meal preparation?:  (unable to determine/ assess- patient declined full TOC call) Do you need assistance with eating?:  (unable to determine/ assess- patient declined full TOC call) Do you have difficulty maintaining continence:  (unable to determine/ assess- patient declined full TOC call) Do you need assistance with getting out of bed/getting out of a chair/moving?:  (unable to determine/ assess- patient declined full TOC call) Do you have difficulty managing or taking your medications?:  (unable to determine/ assess- patient declined full TOC call)  Follow up appointments  reviewed: PCP Follow-up appointment confirmed?: No (declined offer of assistance in scheduling hospital follow up PCP office visit: provided education around importance of scheduling with PCP post-hospital discharge) MD Provider Line Number:[phone] Given: No (verified well-established with current PCP) Specialist Hospital Follow-up appointment confirmed?: Yes Date of Specialist follow-up appointment?: 08/02/24 Follow-Up Specialty Provider:: Pulmonary provider Do you need transportation to your follow-up appointment?:  (unable to determine/ assess- patient declined full TOC call) Do you understand care options if your condition(s) worsen?: Yes-patient verbalized understanding  SDOH Interventions Today    Flowsheet Row Most Recent Value  SDOH Interventions   Food Insecurity Interventions Intervention Not Indicated  Housing Interventions Intervention Not Indicated  Transportation Interventions Patient Declined  [unable to determine/ assess- patient declined full TOC call]  Utilities Interventions Patient Declined  [unable to determine/ assess- patient declined full TOC call]   See TOC assessment tabs for additional assessment/ TOC intervention information- patient declined full TOC call: unable to complete full TOC assessments accordingly; unable to offer TOC 30-day program enrollment  Pls call/ message for questions,  Beatris Blinda Lawrence, RN, BSN, Media Planner  Transitions of Care  [facility]  [place] [phone]: direct office  "

## 2024-07-25 NOTE — Telephone Encounter (Signed)
 Pt calling to let let Dr Pietro know that she went to the hospital and was discharged on o2.Wants to make sure he was aware. Advised I will let him know.

## 2024-07-27 ENCOUNTER — Ambulatory Visit: Admitting: Family Medicine

## 2024-07-28 ENCOUNTER — Ambulatory Visit (INDEPENDENT_AMBULATORY_CARE_PROVIDER_SITE_OTHER): Admitting: *Deleted

## 2024-07-28 VITALS — Ht 66.0 in | Wt 113.0 lb

## 2024-07-28 DIAGNOSIS — Z0000 Encounter for general adult medical examination without abnormal findings: Secondary | ICD-10-CM | POA: Diagnosis not present

## 2024-07-28 NOTE — Patient Instructions (Signed)
 Ms. [name],  Thank you for taking the time for your Medicare Wellness Visit. I appreciate your continued commitment to your health goals. Please review the care plan we discussed, and feel free to reach out if I can assist you further.  Please note that Annual Wellness Visits do not include a physical exam. Some assessments may be limited, especially if the visit was conducted virtually. If needed, we may recommend an in-person follow-up with your provider.  Ongoing Care Seeing your primary care provider every 3 to 6 months helps us  monitor your health and provide consistent, personalized care.   Referrals If a referral was made during today's visit and you haven't received any updates within two weeks, please contact the referred provider directly to check on the status.  Recommended Screenings:  Health Maintenance  Topic Date Due   Zoster (Shingles) Vaccine (2 of 2) 09/02/2010   Breast Cancer Screening  12/31/2022   Medicare Annual Wellness Visit  07/28/2025   DTaP/Tdap/Td vaccine (3 - Td or Tdap) 08/02/2030   Pneumococcal Vaccine for age over 53  Completed   Flu Shot  Completed   Osteoporosis screening with Bone Density Scan  Completed   Hepatitis C Screening  Completed   Meningitis B Vaccine  Aged Out   Colon Cancer Screening  Discontinued   COVID-19 Vaccine  Discontinued       07/28/2024    1:43 PM  Advanced Directives  Does Patient Have a Medical Advance Directive? Yes  Type of Advance Directive Healthcare Power of Attorney  Copy of Healthcare Power of Attorney in Chart? No - copy requested    Vision: Annual vision screenings are recommended for early detection of glaucoma, cataracts, and diabetic retinopathy. These exams can also reveal signs of chronic conditions such as diabetes and high blood pressure.  Dental: Annual dental screenings help detect early signs of oral cancer, gum disease, and other conditions linked to overall health, including heart disease and  diabetes.  Please see the attached documents for additional preventive care recommendations.    Ms. [name] , Thank you for taking time to come for your Medicare Wellness Visit. I appreciate your ongoing commitment to your health goals. Please review the following plan we discussed and let me know if I can assist you in the future.   Screening recommendations/referrals: Colonoscopy:  Mammogram:  Bone Density:  Recommended yearly ophthalmology/optometry visit for glaucoma screening and checkup Recommended yearly dental visit for hygiene and checkup  Vaccinations: Influenza vaccine:  Pneumococcal vaccine:  Tdap vaccine:  Shingles vaccine:       Preventive Care 65 Years and Older, Female Preventive care refers to lifestyle choices and visits with your health care provider that can promote health and wellness. What does preventive care include? A yearly physical exam. This is also called an annual well check. Dental exams once or twice a year. Routine eye exams. Ask your health care provider how often you should have your eyes checked. Personal lifestyle choices, including: Daily care of your teeth and gums. Regular physical activity. Eating a healthy diet. Avoiding tobacco and drug use. Limiting alcohol  use. Practicing safe sex. Taking low-dose aspirin  every day. Taking vitamin and mineral supplements as recommended by your health care provider. What happens during an annual well check? The services and screenings done by your health care provider during your annual well check will depend on your age, overall health, lifestyle risk factors, and family history of disease. Counseling  Your health care provider may ask you questions  about your: Alcohol  use. Tobacco use. Drug use. Emotional well-being. Home and relationship well-being. Sexual activity. Eating habits. History of falls. Memory and ability to understand (cognition). Work and work astronomer. Reproductive  health. Screening  You may have the following tests or measurements: Height, weight, and BMI. Blood pressure. Lipid and cholesterol levels. These may be checked every 5 years, or more frequently if you are over 66 years old. Skin check. Lung cancer screening. You may have this screening every year starting at age 34 if you have a 30-pack-year history of smoking and currently smoke or have quit within the past 15 years. Fecal occult blood test (FOBT) of the stool. You may have this test every year starting at age 40. Flexible sigmoidoscopy or colonoscopy. You may have a sigmoidoscopy every 5 years or a colonoscopy every 10 years starting at age 13. Hepatitis C blood test. Hepatitis B blood test. Sexually transmitted disease (STD) testing. Diabetes screening. This is done by checking your blood sugar (glucose) after you have not eaten for a while (fasting). You may have this done every 1-3 years. Bone density scan. This is done to screen for osteoporosis. You may have this done starting at age 20. Mammogram. This may be done every 1-2 years. Talk to your health care provider about how often you should have regular mammograms. Talk with your health care provider about your test results, treatment options, and if necessary, the need for more tests. Vaccines  Your health care provider may recommend certain vaccines, such as: Influenza vaccine. This is recommended every year. Tetanus, diphtheria, and acellular pertussis (Tdap, Td) vaccine. You may need a Td booster every 10 years. Zoster vaccine. You may need this after age 20. Pneumococcal 13-valent conjugate (PCV13) vaccine. One dose is recommended after age 16. Pneumococcal polysaccharide (PPSV23) vaccine. One dose is recommended after age 66. Talk to your health care provider about which screenings and vaccines you need and how often you need them. This information is not intended to replace advice given to you by your health care provider.  Make sure you discuss any questions you have with your health care provider. Document Released: 07/20/2015 Document Revised: 03/12/2016 Document Reviewed: 04/24/2015 Elsevier Interactive Patient Education  2017 [employer].  Fall Prevention in the Home Falls can cause injuries. They can happen to people of all ages. There are many things you can do to make your home safe and to help prevent falls. What can I do on the outside of my home? Regularly fix the edges of walkways and driveways and fix any cracks. Remove anything that might make you trip as you walk through a door, such as a raised step or threshold. Trim any bushes or trees on the path to your home. Use bright outdoor lighting. Clear any walking paths of anything that might make someone trip, such as rocks or tools. Regularly check to see if handrails are loose or broken. Make sure that both sides of any steps have handrails. Any raised decks and porches should have guardrails on the edges. Have any leaves, snow, or ice cleared regularly. Use sand or salt on walking paths during winter. Clean up any spills in your garage right away. This includes oil or grease spills. What can I do in the bathroom? Use night lights. Install grab bars by the toilet and in the tub and shower. Do not use towel bars as grab bars. Use non-skid mats or decals in the tub or shower. If you need to sit down  in the shower, use a plastic, non-slip stool. Keep the floor dry. Clean up any water  that spills on the floor as soon as it happens. Remove soap buildup in the tub or shower regularly. Attach bath mats securely with double-sided non-slip rug tape. Do not have throw rugs and other things on the floor that can make you trip. What can I do in the bedroom? Use night lights. Make sure that you have a light by your bed that is easy to reach. Do not use any sheets or blankets that are too big for your bed. They should not hang down onto the floor. Have a  firm chair that has side arms. You can use this for support while you get dressed. Do not have throw rugs and other things on the floor that can make you trip. What can I do in the kitchen? Clean up any spills right away. Avoid walking on wet floors. Keep items that you use a lot in easy-to-reach places. If you need to reach something above you, use a strong step stool that has a grab bar. Keep electrical cords out of the way. Do not use floor polish or wax that makes floors slippery. If you must use wax, use non-skid floor wax. Do not have throw rugs and other things on the floor that can make you trip. What can I do with my stairs? Do not leave any items on the stairs. Make sure that there are handrails on both sides of the stairs and use them. Fix handrails that are broken or loose. Make sure that handrails are as long as the stairways. Check any carpeting to make sure that it is firmly attached to the stairs. Fix any carpet that is loose or worn. Avoid having throw rugs at the top or bottom of the stairs. If you do have throw rugs, attach them to the floor with carpet tape. Make sure that you have a light switch at the top of the stairs and the bottom of the stairs. If you do not have them, ask someone to add them for you. What else can I do to help prevent falls? Wear shoes that: Do not have high heels. Have rubber bottoms. Are comfortable and fit you well. Are closed at the toe. Do not wear sandals. If you use a stepladder: Make sure that it is fully opened. Do not climb a closed stepladder. Make sure that both sides of the stepladder are locked into place. Ask someone to hold it for you, if possible. Clearly mark and make sure that you can see: Any grab bars or handrails. First and last steps. Where the edge of each step is. Use tools that help you move around (mobility aids) if they are needed. These include: Canes. Walkers. Scooters. Crutches. Turn on the lights when you  go into a dark area. Replace any light bulbs as soon as they burn out. Set up your furniture so you have a clear path. Avoid moving your furniture around. If any of your floors are uneven, fix them. If there are any pets around you, be aware of where they are. Review your medicines with your doctor. Some medicines can make you feel dizzy. This can increase your chance of falling. Ask your doctor what other things that you can do to help prevent falls. This information is not intended to replace advice given to you by your health care provider. Make sure you discuss any questions you have with your health care provider. Document  Released: 04/19/2009 Document Revised: 11/29/2015 Document Reviewed: 07/28/2014 Elsevier Interactive Patient Education  2017 [employer].

## 2024-07-28 NOTE — Progress Notes (Signed)
 "  Chief Complaint  Patient presents with   Medicare Wellness     Subjective:   [name] is a 79 y.o. female who presents for a Medicare Annual Wellness Visit.  No voiced or noted concerns at this time    Did not want to schedule hospital follow , will call Dr. Mahlon with any issues.     Visit info / Clinical Intake: Medicare Wellness Visit Type:: Subsequent Annual Wellness Visit Persons participating in visit and providing information:: patient Medicare Wellness Visit Mode:: Telephone If telephone:: video declined Since this visit was completed virtually, some vitals may be partially provided or unavailable. Missing vitals are due to the limitations of the virtual format.: Unable to obtain vitals - no equipment If Telephone or Video please confirm:: I connected with patient using audio/video enable telemedicine. I verified patient identity with two identifiers, discussed telehealth limitations, and patient agreed to proceed. Patient Location:: home Provider Location:: home Interpreter Needed?: No Pre-visit prep was completed: no AWV questionnaire completed by patient prior to visit?: no Living arrangements:: (!) lives alone Patient's Overall Health Status Rating: (!) fair Typical amount of pain: none Does pain affect daily life?: no Are you currently prescribed opioids?: no  Dietary Habits and Nutritional Risks How many meals a day?: 3 Eats fruit and vegetables daily?: yes Most meals are obtained by: preparing own meals In the last 2 weeks, have you had any of the following?: none Diabetic:: no  Functional Status Activities of Daily Living (to include ambulation/medication): Independent Ambulation: Independent with device- listed below Home Assistive Devices/Equipment: Cane Medication Administration: Independent Home Management (perform basic housework or laundry): Independent Manage your own finances?: yes Primary transportation is: driving Concerns about vision?: no  *vision screening is required for WTM* Concerns about hearing?: no  Fall Screening Falls in the past year?: 0 Number of falls in past year: 0 Was there an injury with Fall?: 0 Fall Risk Category Calculator: 0 Patient Fall Risk Level: Low Fall Risk  Fall Risk Patient at Risk for Falls Due to: Impaired balance/gait; Impaired mobility Fall risk Follow up: Falls evaluation completed; Education provided; Falls prevention discussed  Home and Transportation Safety: All rugs have non-skid backing?: N/A, no rugs All stairs or steps have railings?: yes Grab bars in the bathtub or shower?: yes Have non-skid surface in bathtub or shower?: (!) no Good home lighting?: yes Regular seat belt use?: yes Hospital stays in the last year:: (!) yes How many hospital stays:: 1 Reason: resiptory failure with hypoxia  Cognitive Assessment Difficulty concentrating, remembering, or making decisions? : yes Will 6CIT or Mini Cog be Completed: yes What year is it?: 0 points What month is it?: 0 points Give patient an address phrase to remember (5 components): Its very sunny outside today in January About what time is it?: 0 points Count backwards from 20 to 1: 2 points Say the months of the year in reverse: 2 points Repeat the address phrase from earlier: 2 points 6 CIT Score: 6 points  Advance Directives (For Healthcare) Does Patient Have a Medical Advance Directive?: Yes Type of Advance Directive: Healthcare Power of Attorney Copy of Healthcare Power of Attorney in Chart?: No - copy requested Would patient like information on creating a medical advance directive?: No - Patient declined  Reviewed/Updated  Reviewed/Updated: Reviewed All (Medical, Surgical, Family, Medications, Allergies, Care Teams, Patient Goals); Surgical History; Family History; Medications; Allergies; Care Teams; Patient Goals; Medical History    Allergies (verified) Buprenorphine, Erythromycin, Iodinated contrast media,  Latex, Other, Tape, Ciprofloxacin , Duloxetine  hcl, Gabapentin , Hydrocodone, Hydromorphone , Iodine, Levofloxacin, Metrizamide, Nsaids, Nucynta [tapentadol hcl], Oxycodone , Pentazocine, Pregabalin, Sulfasalazine, Morphine, Sulfa antibiotics, and Sulfamethoxazole-trimethoprim   Current Medications (verified) Outpatient Encounter Medications as of 07/28/2024  Medication Sig   acetaminophen  (TYLENOL ) 500 MG tablet Take 500 mg by mouth every 6 (six) hours as needed (pain.).   amiodarone  (PACERONE ) 200 MG tablet Take 200 mg by mouth daily.   apixaban  (ELIQUIS ) 2.5 MG TABS tablet Take 1 tablet (2.5 mg total) by mouth 2 (two) times daily.   Calcium  Carbonate Antacid (TUMS CHEWY BITES PO) Take 1 tablet by mouth daily as needed (reflux).    Calcium -Cholecalciferol-Zinc (VIACTIV CALCIUM  IMMUNE) 650-20-5.5 MG-MCG-MG CHEW Chew 1 tablet by mouth daily.   Carboxymethylcellul-Glycerin  (LUBRICATING EYE DROPS OP) Place 1 drop into both eyes 3 (three) times daily as needed (dry/irritated eys.).   cephALEXin  (KEFLEX ) 500 MG capsule Rake all 4 tablets 1 hour prior to dental procedure   Cholecalciferol (VITAMIN D3) 50 MCG (2000 UT) capsule Take 2,000 Units by mouth daily.   clonazePAM  (KLONOPIN ) 0.5 MG tablet Take 0.5 tablets (0.25 mg total) by mouth 2 (two) times daily as needed for anxiety.   cyanocobalamin  (VITAMIN B12) 1000 MCG/ML injection Inject 1,000 mcg into the muscle every 30 (thirty) days.   denosumab  (PROLIA ) 60 MG/ML SOSY injection Inject 60 mg into the skin every 6 (six) months.   diphenhydrAMINE  (BENADRYL ) 50 MG tablet Take 1 tablet (50 mg total) by mouth as directed.   furosemide  (LASIX ) 40 MG tablet Take 1.5 tablets (60 mg total) by mouth daily.   levalbuterol  (XOPENEX  HFA) 45 MCG/ACT inhaler Inhale 2 puffs into the lungs every 4 (four) hours as needed for wheezing.   levothyroxine  (SYNTHROID ) 75 MCG tablet Take 1 tablet by mouth once daily   loratadine (CLARITIN) 10 MG tablet Take 10 mg by mouth in the  morning.   meclizine  (ANTIVERT ) 25 MG tablet TAKE 1 TABLET BY MOUTH THREE TIMES DAILY AS NEEDED FOR DIZZINESS   metoprolol  tartrate (LOPRESSOR ) 25 MG tablet Take 1 tablet by mouth twice daily   mupirocin ointment (BACTROBAN) 2 % Place 1 Application into the nose.   Naloxone HCl 0.4 MG/ML SOCT once as needed.   nystatin  (MYCOSTATIN /NYSTOP ) powder Apply topically 2 (two) times daily as needed (skin irritation (under breasts)).   ondansetron  (ZOFRAN ) 8 MG tablet Take 1 tablet (8 mg total) by mouth every 8 (eight) hours as needed for nausea or vomiting.   ondansetron  (ZOFRAN -ODT) 4 MG disintegrating tablet Take 1 tablet (4 mg total) by mouth every 8 (eight) hours as needed for nausea or vomiting.   oxyCODONE  (ROXICODONE ) 5 MG/5ML solution Take 4 mLs (4 mg total) by mouth every 4 (four) hours as needed for moderate pain.   pantoprazole  (PROTONIX ) 40 MG tablet Take 1 tablet by mouth once daily   polyethylene glycol (MIRALAX  / GLYCOLAX ) packet Take 17 g by mouth daily as needed for mild constipation.   potassium chloride  SA (KLOR-CON  M) 20 MEQ tablet Take 1 tablet (20 mEq total) by mouth daily.   revefenacin  (YUPELRI ) 175 MCG/3ML nebulizer solution Inhale one vial in nebulizer once daily. Do not mix with other nebulized medications.   sodium chloride  (OCEAN) 0.65 % SOLN nasal spray Place 1 spray into both nostrils as needed for congestion (nose bleeds).   valACYclovir  (VALTREX ) 500 MG tablet Take 500 mg by mouth daily as needed (breakouts).   No facility-administered encounter medications on file as of 07/28/2024.    History: Past  Medical History:  Diagnosis Date   Allergies    Anxiety    Arthritis    maybe in my back (03/31/2018)   Benign paroxysmal positional vertigo 06/08/2013   Complication of anesthesia    Fracture of multiple ribs 2015   don't know from what; dx'd when I in hospital for 1st back OR (03/31/2018)   GERD (gastroesophageal reflux disease)    Hair loss 04/12/2012   Herpes     History of blood transfusion    twice; related to back OR (03/31/2018)   History of kidney stones    Interstitial cystitis 11/06/2011   Melanoma of ankle (HCC) ~ 2003   right   Mitral regurgitation    Osteopenia 02/18/2012   Osteoporosis    PAF (paroxysmal atrial fibrillation) (HCC) 2012   Peripheral neuropathy 11/06/2011   PMDD (premenstrual dysphoric disorder)    PONV (postoperative nausea and vomiting)    nausea, vomiting, hives and dizziness    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/2020   Complete bilateral atrial lesion set using cryothermy and bipolar radiofrequency ablation with clipping of LA appendage via right mini-thoracotomy approach   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   Complex valvuloplasty including artificial Gore-tex neochord placement x12 with 32mm Sorin Memo 4D ring annuloplasty   Seasonal allergies    Vaginal delivery    ONE NSVD   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   Past Surgical History:  Procedure Laterality Date   ANTERIOR CERVICAL DECOMP/DISCECTOMY FUSION  ~ 2003   BACK SURGERY     BREAST SURGERY     BREAST BIOPSY--RIGHT BENIGN   BUNIONECTOMY Bilateral    CARDIOVERSION N/A 01/26/2020   Procedure: CARDIOVERSION;  Surgeon: Barbaraann Darryle Ned, MD;  Location: [facility] ENDOSCOPY;  Service: Cardiovascular;  Laterality: N/A;   CARDIOVERSION N/A 10/27/2023   Procedure: CARDIOVERSION;  Surgeon: Shlomo Wilbert SAUNDERS, MD;  Location: [facility] INVASIVE CV LAB;  Service: Cardiovascular;  Laterality: N/A;   COSMETIC SURGERY  2016   back of my neck; related to earlier fusion   CYSTOSCOPY W/ STONE MANIPULATION  several times   DILATION AND CURETTAGE OF UTERUS     FOREHEAD RECONSTRUCTION Right    removed bone protruding out of my forehead   HARDWARE REMOVAL  2016   related to neck OR   INCONTINENCE SURGERY     IR THORACENTESIS RIGHT ASP PLEURAL SPACE W/IMG GUIDE  02/16/2020   MINIMALLY INVASIVE MAZE PROCEDURE N/A 01/17/2020   Procedure: MINIMALLY INVASIVE MAZE  PROCEDURE;  Surgeon: Dusty Sudie DEL, MD;  Location: [facility] OR;  Service: Open Heart Surgery;  Laterality: N/A;   MITRAL VALVE REPAIR Right 01/17/2020   Procedure: MINIMALLY INVASIVE MITRAL VALVE REPAIR (MVR) USING MEMO 4D ;  Surgeon: Dusty Sudie DEL, MD;  Location: [facility] OR;  Service: Open Heart Surgery;  Laterality: Right;   POSTERIOR CERVICAL FUSION/FORAMINOTOMY  ~ 2008; 2015   RIGHT/LEFT HEART CATH AND CORONARY ANGIOGRAPHY N/A 12/02/2019   Procedure: RIGHT/LEFT HEART CATH AND CORONARY ANGIOGRAPHY;  Surgeon: Verlin Lonni BIRCH, MD;  Location: [facility] INVASIVE CV LAB;  Service: Cardiovascular;  Laterality: N/A;   RIGHT/LEFT HEART CATH AND CORONARY ANGIOGRAPHY N/A 04/28/2024   Procedure: RIGHT/LEFT HEART CATH AND CORONARY ANGIOGRAPHY;  Surgeon: Cherrie Toribio SAUNDERS, MD;  Location: [facility] INVASIVE CV LAB;  Service: Cardiovascular;  Laterality: N/A;   SHOULDER ARTHROSCOPY W/ ROTATOR CUFF REPAIR Right 2012   SPINAL FUSION  06/2014 - 2018 X ?7   scoliosis; my entire back   TEE WITHOUT CARDIOVERSION N/A 11/21/2019   Procedure: TRANSESOPHAGEAL  ECHOCARDIOGRAM (TEE);  Surgeon: Delford Maude BROCKS, MD;  Location: [facility] ENDOSCOPY;  Service: Cardiovascular;  Laterality: N/A;   TEE WITHOUT CARDIOVERSION N/A 01/17/2020   Procedure: TRANSESOPHAGEAL ECHOCARDIOGRAM (TEE);  Surgeon: Dusty Sudie DEL, MD;  Location: [facility] OR;  Service: Open Heart Surgery;  Laterality: N/A;   TEE WITHOUT CARDIOVERSION N/A 01/26/2020   Procedure: TRANSESOPHAGEAL ECHOCARDIOGRAM (TEE);  Surgeon: Barbaraann Darryle Ned, MD;  Location: [facility] ENDOSCOPY;  Service: Cardiovascular;  Laterality: N/A;   TUBAL LIGATION     VAGINAL HYSTERECTOMY     TVH   Family History  Problem Relation Age of Onset   Diabetes Father    Hyperlipidemia Sister    Heart disease Sister    Stroke Sister    Diabetes Brother    Hyperlipidemia Sister    Heart disease Sister    Arthritis Mother    Heart disease Mother    Uterine cancer Mother    Diabetes Brother    Heart  Problems Brother    Social History   Occupational History   Occupation: retired  Tobacco Use   Smoking status: Former    Current packs/day: 0.00    Average packs/day: 0.1 packs/day for 14.0 years (1.4 ttl pk-yrs)    Types: Cigarettes    Start date: 1966    Quit date: 1980    Years since quitting: 46.0    Passive exposure: Past   Smokeless tobacco: Never   Tobacco comments:    Former smoker 12/07/23  Vaping Use   Vaping status: Never Used  Substance and Sexual Activity   Alcohol  use: Never   Drug use: Yes    Types: Oxycodone , Benzodiazepines    Comment: 03/31/2018 for chronic neck and back pain, takes Klonopin  at times.    Sexual activity: Not Currently   Tobacco Counseling Counseling given: Not Answered Tobacco comments: Former smoker 12/07/23  SDOH Screenings   Food Insecurity: No Food Insecurity (07/28/2024)  Housing: Unknown (07/28/2024)  Transportation Needs: No Transportation Needs (07/28/2024)  Recent Concern: Transportation Needs - Unmet Transportation Needs (05/23/2024)  Utilities: Not At Risk (07/28/2024)  Alcohol  Screen: Low Risk (10/23/2022)  Depression (PHQ2-9): Low Risk (07/28/2024)  Recent Concern: Depression (PHQ2-9) - Medium Risk (05/23/2024)  Financial Resource Strain: High Risk (05/23/2024)  Physical Activity: Inactive (07/28/2024)  Social Connections: Socially Isolated (07/28/2024)  Stress: Stress Concern Present (07/28/2024)  Tobacco Use: Medium Risk (07/28/2024)  Health Literacy: Adequate Health Literacy (07/28/2024)   See flowsheets for full screening details  Depression Screen Depression Screening Exception Documentation Depression Screening Exception:: Patient refusal (unable to determine/ assess- patient declined full TOC call)  PHQ 2 & 9 Depression Scale- Over the past 2 weeks, how often have you been bothered by any of the following problems? Little interest or pleasure in doing things: 0 Feeling down, depressed, or hopeless (PHQ Adolescent  also includes...irritable): 0 PHQ-2 Total Score: 0 Trouble falling or staying asleep, or sleeping too much: 0 Feeling tired or having little energy: 0 Poor appetite or overeating (PHQ Adolescent also includes...weight loss): 0 Feeling bad about yourself - or that you are a failure or have let yourself or your family down: 0 Trouble concentrating on things, such as reading the newspaper or watching television (PHQ Adolescent also includes...like school work): 0 Moving or speaking so slowly that other people could have noticed. Or the opposite - being so fidgety or restless that you have been moving around a lot more than usual: 0 Thoughts that you would be better off dead, or of hurting yourself  in some way: 0 PHQ-9 Total Score: 0 If you checked off any problems, how difficult have these problems made it for you to do your work, take care of things at home, or get along with other people?: Not difficult at all  Depression Treatment Depression Interventions/Treatment : Medication; Counseling; Community Resources Provided ([facility] number provided to patient as she needs to call them back for scheduling, PCP placed referral last month)     Goals Addressed             This Visit's Progress    Patient Stated       Feel better             Objective:    Today's Vitals   07/28/24 1349  Weight: 113 lb (51.3 kg)  Height: 5' 6 (1.676 m)   Body mass index is 18.24 kg/m.  Hearing/Vision screen Hearing Screening - Comments:: No trouble hearing Vision Screening - Comments:: [place] Immunizations and Health Maintenance Health Maintenance  Topic Date Due   Zoster Vaccines- Shingrix (2 of 2) 09/02/2010   Mammogram  12/31/2022   Medicare Annual Wellness (AWV)  07/28/2025   DTaP/Tdap/Td (3 - Td or Tdap) 08/02/2030   Pneumococcal Vaccine: 50+ Years  Completed   Influenza Vaccine  Completed   Bone Density Scan  Completed   Hepatitis C Screening   Completed   Meningococcal B Vaccine  Aged Out   Colonoscopy  Discontinued   COVID-19 Vaccine  Discontinued        Assessment/Plan:  This is a routine wellness examination for [place].  Patient Care Team: [name], MD as PCP - General (Family Medicine) Inocencio Soyla Lunger, MD as PCP - Electrophysiology (Cardiology) Pietro Redell RAMAN, MD as PCP - Cardiology (Cardiology) Janit Lamar PARAS, MD as Consulting Physician (Urology) Luis Purchase, MD as Consulting Physician (Gastroenterology) Powers, Marsa POUR, MD as Referring Physician (Neurosurgery) Blanca Elsie RAMAN, MD as Consulting Physician (Cardiology) Berline Mile, MD as Referring Physician (Pain Medicine) [facility], [place] Cary Doffing, MD as Consulting Physician (Dermatology) Euna Read, MD as Consulting Physician (Specialist) Dusty Sudie DEL, MD (Inactive) as Consulting Physician (Cardiothoracic Surgery) Ines Onetha NOVAK, MD as Consulting Physician (Neurology) Alen Sieving, DDS as Consulting Physician (Dentistry) Ines Onetha NOVAK, MD as Consulting Physician (Neurology) Nicholaus Sherlean CROME, [facility] (Inactive) as Pharmacist (Pharmacist) Nicholaus Sherlean CROME, [facility] (Inactive) (Pharmacist) Gordy Channing LABOR, RN as [facility] Kara Dorn NOVAK, MD as Consulting Physician (Pulmonary Disease)  I have personally reviewed and noted the following in the patients chart:   Medical and social history Use of alcohol , tobacco or illicit drugs  Current medications and supplements including opioid prescriptions. Functional ability and status Nutritional status Physical activity Advanced directives List of other physicians Hospitalizations, surgeries, and ER visits in previous 12 months Vitals Screenings to include cognitive, depression, and falls Referrals and appointments  No orders of the defined types were placed in this encounter.  In addition, I have reviewed and discussed with  patient certain preventive protocols, quality metrics, and best practice recommendations. A written personalized care plan for preventive services as well as general preventive health recommendations were provided to patient.   Mliss Graff, LPN   [id number]/[id number]   Return in 1 year (on 07/28/2025).  After Visit Summary: (MyChart) Due to this being a telephonic visit, the after visit summary with patients personalized plan was offered to patient via MyChart   Nurse Notes:  "

## 2024-07-29 ENCOUNTER — Telehealth: Payer: Self-pay

## 2024-07-29 ENCOUNTER — Telehealth: Payer: Self-pay | Admitting: Pulmonary Disease
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
## 2024-07-29 NOTE — Telephone Encounter (Signed)
 I have spoken to Jada with [employer]. She stated that a tech is scheduled to home to the patients house between 3:15-7:15P today.  Spoke to patient and advised her of above. She voiced her understanding.  NFN
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
## 2024-07-29 NOTE — Telephone Encounter (Signed)
 Copied from CRM [id number]. Topic: Clinical - Medication Question >> Jul 28, 2024  9:49 AM Rozanna MATSU wrote: Reason for CRM: pt is requesting to speak with Dr Luann nurse she has some questions and would a call back today. >> Jul 29, 2024  9:46 AM Rozanna G wrote: Can someone please call the pt, she has called twice on yesterday and calling this morning requesting to speak with Dr. Luann nurse >> Jul 28, 2024  2:58 PM Rozanna MATSU wrote: Pt is calling back about speaking with nurse, offered to help her but she insisted on speaking with  nurse. Advised her that if she calls before 3pm someone will call her back the sam day, she called this morning around 950

## 2024-07-30 ENCOUNTER — Telehealth: Payer: Self-pay | Admitting: Physician Assistant

## 2024-07-30 ENCOUNTER — Telehealth: Payer: Self-pay | Admitting: Pulmonary Disease
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
## 2024-07-30 NOTE — Telephone Encounter (Signed)
" ° °  Patient with a history of chronic epistaxis called with concern regarding continuation of anticoagulation after a recent nosebleed. She reports recent hospital discharge with home oxygen . She experienced an issue with her humidifier, which was removed, and used dry oxygen  overnight. She subsequently awoke with a nosebleed that resolved with a nasal plug.  The patient did not take her Eliquis  this morning per pulmonarys recommendation, pending discussion with cardiology. Chart review indicates pulmonary recommended using oxygen  via the mouth at this time to reduce the risk of recurrent epistaxis. I agree with this recommendation and encouraged the patient to continue using oxygen  over the mouth.  At this time, the benefit of continuing Eliquis  outweighs the risk of recurrent epistaxis. The patient was advised to resume Eliquis  and to contact the office if nosebleeds recur for further reassessment. Will forward to Dr. Pietro to update.   Signed, Regina CINDERELLA Kapur, PA-C 07/30/2024, 9:43 AM Pager: [phone]  "

## 2024-08-01 ENCOUNTER — Ambulatory Visit: Admitting: Pulmonary Disease

## 2024-08-02 ENCOUNTER — Encounter: Payer: Self-pay | Admitting: Pulmonary Disease

## 2024-08-02 ENCOUNTER — Ambulatory Visit

## 2024-08-02 ENCOUNTER — Ambulatory Visit: Admitting: Pulmonary Disease

## 2024-08-02 VITALS — BP 120/70 | HR 63 | Ht 66.0 in | Wt 119.4 lb

## 2024-08-02 DIAGNOSIS — M419 Scoliosis, unspecified: Secondary | ICD-10-CM | POA: Diagnosis not present

## 2024-08-02 DIAGNOSIS — J9611 Chronic respiratory failure with hypoxia: Secondary | ICD-10-CM | POA: Diagnosis not present

## 2024-08-02 DIAGNOSIS — J479 Bronchiectasis, uncomplicated: Secondary | ICD-10-CM

## 2024-08-02 DIAGNOSIS — Z87891 Personal history of nicotine dependence: Secondary | ICD-10-CM | POA: Diagnosis not present

## 2024-08-02 DIAGNOSIS — J984 Other disorders of lung: Secondary | ICD-10-CM | POA: Diagnosis not present

## 2024-08-02 DIAGNOSIS — I272 Pulmonary hypertension, unspecified: Secondary | ICD-10-CM | POA: Diagnosis not present

## 2024-08-02 DIAGNOSIS — R0609 Other forms of dyspnea: Secondary | ICD-10-CM

## 2024-08-02 LAB — PULMONARY FUNCTION TEST
DL/VA % pred: 92 %
DL/VA: 3.71 ml/min/mmHg/L
DLCO cor % pred: 42 %
DLCO cor: 8.44 ml/min/mmHg
DLCO unc % pred: 38 %
DLCO unc: 7.68 ml/min/mmHg
FEF 25-75 Post: 1.11 L/s
FEF 25-75 Pre: 0.84 L/s
FEF2575-%Change-Post: 31 %
FEF2575-%Pred-Post: 72 %
FEF2575-%Pred-Pre: 54 %
FEV1-%Change-Post: 8 %
FEV1-%Pred-Post: 48 %
FEV1-%Pred-Pre: 44 %
FEV1-Post: 1.04 L
FEV1-Pre: 0.96 L
FEV1FVC-%Change-Post: 10 %
FEV1FVC-%Pred-Pre: 107 %
FEV6-%Change-Post: -1 %
FEV6-%Pred-Post: 44 %
FEV6-%Pred-Pre: 44 %
FEV6-Post: 1.2 L
FEV6-Pre: 1.21 L
FEV6FVC-%Pred-Post: 105 %
FEV6FVC-%Pred-Pre: 105 %
FVC-%Change-Post: -1 %
FVC-%Pred-Post: 41 %
FVC-%Pred-Pre: 42 %
FVC-Post: 1.2 L
FVC-Pre: 1.21 L
Post FEV1/FVC ratio: 87 %
Post FEV6/FVC ratio: 100 %
Pre FEV1/FVC ratio: 79 %
Pre FEV6/FVC Ratio: 100 %
RV % pred: 139 %
RV: 3.48 L
TLC % pred: 86 %
TLC: 4.63 L

## 2024-08-02 NOTE — Patient Instructions (Signed)
 Based on your oxygen  measurements today, recommend using oxygen  2-3L at all times, day and night along with when walking around.  We will send in orders for a portable oxygen  concentrator.  Buy an oximeter to check your oxygen  levels intermittently at home, goal is to have an oxygen  level about 88%.   Follow up in 3 months

## 2024-08-02 NOTE — Progress Notes (Signed)
 "  New Patient Pulmonology Office Visit   Subjective:  Patient ID: [name], female    DOB: 06-Jun-1946  MRN: [id number]  Referred by: [name], MD  CC:  Chief Complaint  Patient presents with   Medical Management of Chronic Issues    Pt states post PFT    Discussed the use of AI scribe software for clinical note transcription with the patient, who gave verbal consent to proceed.  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 She is accompanied by her sister, Winton.  She was recently hospitalized at [facility] with pneumonia and bronchiectasis and was changed from nocturnal oxygen  to continuous oxygen . She has difficulty safely managing the oxygen  equipment at home because of long cords and large tanks.  Her oxygen  saturation was 86% on room air at home when EMS was called. She is unsure of all in-hospital tests but recalls a heart evaluation and chest x-ray. More recently, her oxygen  saturation was 98% on room air at rest, but she is worried it drops with activity.  She has scoliosis with prior spine surgeries and hardware that has made her chest wall stiff.  She uses a walker at home, lives in a two-story house, and gets winded walking in grocery stores, using a cane for support. Her lungs feel improved compared with hospitalization, but she has nasal congestion and throat clearing. She stopped her nebulizer because she did not feel any benefit.  She has chronic back and neck pain and takes daily oxycodone , managed by a pain specialist every five to six weeks.  For several weeks she has felt unwell with chills and feeling cold, but no fevers. She is concerned about maintaining independence and mobility given her breathing and pain issues.        ROS  Allergies: Buprenorphine,  Erythromycin, Iodinated contrast media, Latex, Other, Tape, Ciprofloxacin , Duloxetine  hcl, Gabapentin , Hydrocodone, Hydromorphone , Iodine, Levofloxacin, Metrizamide, Nsaids, Nucynta [tapentadol hcl], Oxycodone , Pentazocine, Pregabalin, Sulfasalazine, Morphine, Sulfa antibiotics, and Sulfamethoxazole-trimethoprim Current Medications[1] 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   Mitral regurgitation    Osteopenia 02/18/2012   Osteoporosis    PAF (paroxysmal atrial fibrillation) (HCC) 2012   Peripheral neuropathy 11/06/2011   PMDD (premenstrual dysphoric disorder)    PONV (postoperative nausea and vomiting)    nausea, vomiting, hives and dizziness    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/2020   Complete bilateral atrial lesion set using cryothermy and bipolar radiofrequency ablation with clipping of LA appendage via right mini-thoracotomy approach   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   Complex valvuloplasty including artificial Gore-tex neochord placement x12 with 32mm Sorin Memo 4D ring annuloplasty   Seasonal allergies    Vaginal delivery  ONE NSVD   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   Past Surgical History:  Procedure Laterality Date   ANTERIOR CERVICAL DECOMP/DISCECTOMY FUSION  ~ 2003   BACK SURGERY     BREAST SURGERY     BREAST BIOPSY--RIGHT BENIGN   BUNIONECTOMY Bilateral    CARDIOVERSION N/A 01/26/2020   Procedure: CARDIOVERSION;  Surgeon: Barbaraann Darryle Ned,  MD;  Location: [facility] ENDOSCOPY;  Service: Cardiovascular;  Laterality: N/A;   CARDIOVERSION N/A 10/27/2023   Procedure: CARDIOVERSION;  Surgeon: Shlomo Wilbert SAUNDERS, MD;  Location: [facility] INVASIVE CV LAB;  Service: Cardiovascular;  Laterality: N/A;   COSMETIC SURGERY  2016   back of my neck; related to earlier fusion   CYSTOSCOPY W/ STONE MANIPULATION  several times   DILATION AND CURETTAGE OF UTERUS     FOREHEAD RECONSTRUCTION Right    removed bone protruding out of my forehead   HARDWARE REMOVAL  2016   related to neck OR   INCONTINENCE SURGERY     IR THORACENTESIS RIGHT ASP PLEURAL SPACE W/IMG GUIDE  02/16/2020   MINIMALLY INVASIVE MAZE PROCEDURE N/A 01/17/2020   Procedure: MINIMALLY INVASIVE MAZE PROCEDURE;  Surgeon: Dusty Sudie DEL, MD;  Location: [facility] OR;  Service: Open Heart Surgery;  Laterality: N/A;   MITRAL VALVE REPAIR Right 01/17/2020   Procedure: MINIMALLY INVASIVE MITRAL VALVE REPAIR (MVR) USING MEMO 4D ;  Surgeon: Dusty Sudie DEL, MD;  Location: [facility] OR;  Service: Open Heart Surgery;  Laterality: Right;   POSTERIOR CERVICAL FUSION/FORAMINOTOMY  ~ 2008; 2015   RIGHT/LEFT HEART CATH AND CORONARY ANGIOGRAPHY N/A 12/02/2019   Procedure: RIGHT/LEFT HEART CATH AND CORONARY ANGIOGRAPHY;  Surgeon: Verlin Lonni BIRCH, MD;  Location: [facility] INVASIVE CV LAB;  Service: Cardiovascular;  Laterality: N/A;   RIGHT/LEFT HEART CATH AND CORONARY ANGIOGRAPHY N/A 04/28/2024   Procedure: RIGHT/LEFT HEART CATH AND CORONARY ANGIOGRAPHY;  Surgeon: Cherrie Toribio SAUNDERS, MD;  Location: [facility] INVASIVE CV LAB;  Service: Cardiovascular;  Laterality: N/A;   SHOULDER ARTHROSCOPY W/ ROTATOR CUFF REPAIR Right 2012   SPINAL FUSION  06/2014 - 2018 X ?7   scoliosis; my entire back   TEE WITHOUT CARDIOVERSION N/A 11/21/2019   Procedure: TRANSESOPHAGEAL ECHOCARDIOGRAM (TEE);  Surgeon: Delford Maude BROCKS, MD;  Location: [facility] ENDOSCOPY;  Service: Cardiovascular;  Laterality: N/A;   TEE WITHOUT CARDIOVERSION N/A 01/17/2020    Procedure: TRANSESOPHAGEAL ECHOCARDIOGRAM (TEE);  Surgeon: Dusty Sudie DEL, MD;  Location: [facility] OR;  Service: Open Heart Surgery;  Laterality: N/A;   TEE WITHOUT CARDIOVERSION N/A 01/26/2020   Procedure: TRANSESOPHAGEAL ECHOCARDIOGRAM (TEE);  Surgeon: Barbaraann Darryle Ned, MD;  Location: [facility] ENDOSCOPY;  Service: Cardiovascular;  Laterality: N/A;   TUBAL LIGATION     VAGINAL HYSTERECTOMY     TVH   Family History  Problem Relation Age of Onset   Diabetes Father    Hyperlipidemia Sister    Heart disease Sister    Stroke Sister    Diabetes Brother    Hyperlipidemia Sister    Heart disease Sister    Arthritis Mother    Heart disease Mother    Uterine cancer Mother    Diabetes Brother    Heart Problems Brother    Social History   Socioeconomic History   Marital status: Divorced    Spouse name: none/divorced   Number of children: Not on file   Years of education: Not on file   Highest education level: 12th grade  Occupational History   Occupation: retired  Tobacco Use   Smoking status: Former    Current packs/day:  0.00    Average packs/day: 0.1 packs/day for 14.0 years (1.4 ttl pk-yrs)    Types: Cigarettes    Start date: 17    Quit date: 43    Years since quitting: 46.1    Passive exposure: Past   Smokeless tobacco: Never   Tobacco comments:    Former smoker 12/07/23  Vaping Use   Vaping status: Never Used  Substance and Sexual Activity   Alcohol  use: Never   Drug use: Yes    Types: Oxycodone , Benzodiazepines    Comment: 03/31/2018 for chronic neck and back pain, takes Klonopin  at times.    Sexual activity: Not Currently  Other Topics Concern   Not on file  Social History Narrative   Lives by herself.    Social Drivers of Health   Tobacco Use: Medium Risk (08/02/2024)   Patient History    Smoking Tobacco Use: Former    Smokeless Tobacco Use: Never    Passive Exposure: Past  Physicist, Medical Strain: High Risk (05/23/2024)   Overall Financial Resource  Strain (CARDIA)    Difficulty of Paying Living Expenses: Very hard  Food Insecurity: No Food Insecurity (07/28/2024)   Epic    Worried About Programme Researcher, Broadcasting/film/video in the Last Year: Never true    Ran Out of Food in the Last Year: Never true  Transportation Needs: No Transportation Needs (07/28/2024)   Epic    Lack of Transportation (Medical): No    Lack of Transportation (Non-Medical): No  Recent Concern: Transportation Needs - Unmet Transportation Needs (05/23/2024)   Epic    Lack of Transportation (Medical): Yes    Lack of Transportation (Non-Medical): Yes  Physical Activity: Inactive (07/28/2024)   Exercise Vital Sign    Days of Exercise per Week: 0 days    Minutes of Exercise per Session: 0 min  Stress: Stress Concern Present (07/28/2024)   [employer] of Occupational Health - Occupational Stress Questionnaire    Feeling of Stress: To some extent  Social Connections: Socially Isolated (07/28/2024)   Social Connection and Isolation Panel    Frequency of Communication with Friends and Family: More than three times a week    Frequency of Social Gatherings with Friends and Family: Once a week    Attends Religious Services: Never    Database Administrator or Organizations: No    Attends Banker Meetings: Never    Marital Status: Divorced  Catering Manager Violence: Not At Risk (07/28/2024)   Epic    Fear of Current or Ex-Partner: No    Emotionally Abused: No    Physically Abused: No    Sexually Abused: No  Depression (PHQ2-9): Low Risk (07/28/2024)   Depression (PHQ2-9)    PHQ-2 Score: 0  Recent Concern: Depression (PHQ2-9) - Medium Risk (05/23/2024)   Depression (PHQ2-9)    PHQ-2 Score: 7  Alcohol  Screen: Low Risk (10/23/2022)   Alcohol  Screen    Last Alcohol  Screening Score (AUDIT): 0  Housing: Unknown (07/28/2024)   Epic    Unable to Pay for Housing in the Last Year: No    Number of Times Moved in the Last Year: Not on file    Homeless in the Last Year: No   Utilities: Not At Risk (07/28/2024)   Epic    Threatened with loss of utilities: No  Health Literacy: Adequate Health Literacy (07/28/2024)   B1300 Health Literacy    Frequency of need for help with medical instructions: Never  Objective:  BP 120/70   Pulse 63   Ht 5' 6 (1.676 m) Comment: per RRT notes  Wt 119 lb 6.4 oz (54.2 kg) Comment: per RRT notes  SpO2 96%   BMI 19.27 kg/m    Physical Exam Constitutional:      General: She is not in acute distress.    Appearance: Normal appearance. She is underweight.  Eyes:     General: No scleral icterus.    Conjunctiva/sclera: Conjunctivae normal.  Cardiovascular:     Rate and Rhythm: Normal rate and regular rhythm.  Pulmonary:     Breath sounds: No wheezing, rhonchi or rales.  Musculoskeletal:     Right lower leg: No edema.     Left lower leg: No edema.  Skin:    General: Skin is warm and dry.  Neurological:     General: No focal deficit present.    Tue Aug 02, 2024 11:58 AM   SATURATION QUALIFICATIONS: (This note is used to comply with regulatory documentation for home oxygen )   Patient Saturations on Room Air at Rest = 82%   Patient Saturations on Room Air while Ambulating = 82%   Patient Saturations on 2/3 Liters of oxygen  while Ambulating = 96%   Please briefly explain why patient needs home oxygen : Patient sats @ 82% room air - placed on 2/3 L O2 with one full lap ( could not do 3 due to back pain ) via - POC Sats @ 96 %   Diagnostic Review:  Last CBC Lab Results  Component Value Date   WBC 7.3 06/29/2024   HGB 12.4 06/29/2024   HCT 38.3 06/29/2024   MCV 91.7 06/29/2024   MCH 29.5 04/28/2024   RDW 15.5 06/29/2024   PLT 186.0 06/29/2024   Last metabolic panel Lab Results  Component Value Date   GLUCOSE 88 07/08/2024   NA 144 07/08/2024   K 4.6 07/08/2024   CL 95 (L) 07/08/2024   CO2 40 (H) 07/08/2024   BUN 30 (H) 07/08/2024   CREATININE 1.02 07/08/2024   GFR 52.17 (L) 07/08/2024   CALCIUM   9.6 07/08/2024   PROT 6.7 06/29/2024   ALBUMIN  3.8 06/29/2024   LABGLOB 2.7 06/07/2024   AGRATIO 1.7 10/03/2022   BILITOT 0.8 06/29/2024   ALKPHOS 93 06/29/2024   AST 23 06/29/2024   ALT 17 06/29/2024   ANIONGAP 10 04/28/2024   CT Chest 07/16/24 FINDINGS:   CHEST:  -Lungs: Bronchiectasis with scarring within the lingula. Biapical pleural-parenchymal scarring. Diffuse bilateral centrilobular groundglass nodules and opacities. Focal opacities noted within the posterior lateral right upper lobe.  -Pleura:  No pleural effusion or pneumothorax.  -Lymph nodes:  None in axillary, hilar, or mediastinal regions.  -Other: Unremarkable thyroid  gland.   UPPER ABDOMEN:  -Unremarkable visualized portions of the upper abdomen.       VASCULAR:        -Cardiomegaly without pericardial effusion. Mitral valve replacement and coronary artery calcifications. Left atrial appendage clip.   OSSEOUS STRUCTURES:  -Extensive posterior fusion hardware. Anterior cervical fusion also present. Degenerative changes without fracture.   IMPRESSION:  1. Bronchiectasis with diffuse bilateral centrilobular groundglass nodularity and opacities compatible with pneumonia/pneumonitis, potentially secondary to a typical organisms such as MAC. Of note, this has worsened since the CT from 06/22/2022.   2. Additional findings as detailed above.     Assessment & Plan:   Assessment & Plan Chronic hypoxemic respiratory failure (HCC)     Pulmonary hypertension (HCC)  Bronchiectasis without complication (HCC)     DOE (dyspnea on exertion)  Orders:   Ambulatory Referral for DME   Assessment and Plan Assessment & Plan Chronic respiratory failure with hypoxemia Performed walk test and POC titration today, as above under exam - Ordered portable oxygen  concentrator, 2-3L pulsed   - Instructed continuous 2-3Loxygen use at night and during activity. - Advised monitoring oxygen  saturation at rest and during  activity; consider reducing oxygen  if consistently above 88% at rest.  Pulmonary hypertension Pulmonary hypertension confirmed by heart catheterization, contributing to respiratory symptoms and oxygen  therapy need. Reduced diffusion capacity on breathing tests indicates impaired oxygen  transfer. - Continue monitoring oxygen  saturation during activity to assess supplemental oxygen  need.  Bronchiectasis Mild bronchiectasis contributing to obstructive lung changes. Recent exacerbation possibly related to pneumonia, increasing oxygen  requirement. - Continue monitoring respiratory symptoms and oxygen  saturation.  Restrictive lung disease due to scoliosis and spinal hardware Restrictive lung disease secondary to scoliosis and spinal hardware, contributing to mixed lung changes. Chest wall stiffening affects inhalation and exhalation. - Continue monitoring respiratory function and oxygen  saturation.      Return in about 3 months (around 10/31/2024) for f/u visit Dr. Kara.   Dorn KATHEE Kara, MD     [1]  Current Outpatient Medications:    acetaminophen  (TYLENOL ) 500 MG tablet, Take 500 mg by mouth every 6 (six) hours as needed (pain.)., Disp: , Rfl:    amiodarone  (PACERONE ) 200 MG tablet, Take 200 mg by mouth daily., Disp: , Rfl:    apixaban  (ELIQUIS ) 2.5 MG TABS tablet, Take 1 tablet (2.5 mg total) by mouth 2 (two) times daily., Disp: 60 tablet, Rfl: 11   Calcium  Carbonate Antacid (TUMS CHEWY BITES PO), Take 1 tablet by mouth daily as needed (reflux). , Disp: , Rfl:    Calcium -Cholecalciferol-Zinc (VIACTIV CALCIUM  IMMUNE) 650-20-5.5 MG-MCG-MG CHEW, Chew 1 tablet by mouth daily., Disp: , Rfl:    Carboxymethylcellul-Glycerin  (LUBRICATING EYE DROPS OP), Place 1 drop into both eyes 3 (three) times daily as needed (dry/irritated eys.)., Disp: , Rfl:    Cholecalciferol (VITAMIN D3) 50 MCG (2000 UT) capsule, Take 2,000 Units by mouth daily., Disp: , Rfl:    clonazePAM  (KLONOPIN ) 0.5 MG tablet,  Take 0.5 tablets (0.25 mg total) by mouth 2 (two) times daily as needed for anxiety., Disp: 30 tablet, Rfl: 3   cyanocobalamin  (VITAMIN B12) 1000 MCG/ML injection, Inject 1,000 mcg into the muscle every 30 (thirty) days., Disp: , Rfl:    denosumab  (PROLIA ) 60 MG/ML SOSY injection, Inject 60 mg into the skin every 6 (six) months., Disp: , Rfl:    diphenhydrAMINE  (BENADRYL ) 50 MG tablet, Take 1 tablet (50 mg total) by mouth as directed., Disp: 1 tablet, Rfl: 0   furosemide  (LASIX ) 40 MG tablet, Take 1.5 tablets (60 mg total) by mouth daily., Disp: 135 tablet, Rfl: 3   levalbuterol  (XOPENEX  HFA) 45 MCG/ACT inhaler, Inhale 2 puffs into the lungs every 4 (four) hours as needed for wheezing., Disp: 1 each, Rfl: 6   levothyroxine  (SYNTHROID ) 75 MCG tablet, Take 1 tablet by mouth once daily, Disp: 90 tablet, Rfl: 0   loratadine (CLARITIN) 10 MG tablet, Take 10 mg by mouth in the morning., Disp: , Rfl:    meclizine  (ANTIVERT ) 25 MG tablet, TAKE 1 TABLET BY MOUTH THREE TIMES DAILY AS NEEDED FOR DIZZINESS, Disp: 45 tablet, Rfl: 0   metoprolol  tartrate (LOPRESSOR ) 25 MG tablet, Take 1 tablet by mouth twice daily, Disp: 180 tablet, Rfl: 0   mupirocin  ointment (BACTROBAN) 2 %, Place 1 Application into the nose., Disp: , Rfl:    Naloxone HCl 0.4 MG/ML SOCT, once as needed., Disp: , Rfl:    nystatin  (MYCOSTATIN /NYSTOP ) powder, Apply topically 2 (two) times daily as needed (skin irritation (under breasts))., Disp: 15 g, Rfl: 0   ondansetron  (ZOFRAN ) 8 MG tablet, Take 1 tablet (8 mg total) by mouth every 8 (eight) hours as needed for nausea or vomiting., Disp: 45 tablet, Rfl: 1   ondansetron  (ZOFRAN -ODT) 4 MG disintegrating tablet, Take 1 tablet (4 mg total) by mouth every 8 (eight) hours as needed for nausea or vomiting., Disp: 20 tablet, Rfl: 0   oxyCODONE  (ROXICODONE ) 5 MG/5ML solution, Take 4 mLs (4 mg total) by mouth every 4 (four) hours as needed for moderate pain., Disp: , Rfl: 0   pantoprazole  (PROTONIX ) 40 MG  tablet, Take 1 tablet by mouth once daily, Disp: 30 tablet, Rfl: 0   polyethylene glycol (MIRALAX  / GLYCOLAX ) packet, Take 17 g by mouth daily as needed for mild constipation., Disp: , Rfl:    potassium chloride  SA (KLOR-CON  M) 20 MEQ tablet, Take 1 tablet (20 mEq total) by mouth daily., Disp: 30 tablet, Rfl: 2   sodium chloride  (OCEAN) 0.65 % SOLN nasal spray, Place 1 spray into both nostrils as needed for congestion (nose bleeds)., Disp: , Rfl:    valACYclovir  (VALTREX ) 500 MG tablet, Take 500 mg by mouth daily as needed (breakouts)., Disp: , Rfl:   "

## 2024-08-02 NOTE — Assessment & Plan Note (Signed)
 SABRA

## 2024-08-02 NOTE — Progress Notes (Signed)
 Full pft performed today

## 2024-08-02 NOTE — Telephone Encounter (Signed)
 Pt seen in clinic 08/02/24

## 2024-08-02 NOTE — Patient Instructions (Signed)
 Full pft performed today

## 2024-08-03 NOTE — Addendum Note (Signed)
 Addended by: Rafel Garde W on: 08/03/2024 03:32 PM   Modules accepted: Orders

## 2024-08-03 NOTE — Telephone Encounter (Signed)
 Spoke with pt, Aware of dr vertie recommendations.

## 2024-08-04 ENCOUNTER — Telehealth: Payer: Self-pay

## 2024-08-04 NOTE — Telephone Encounter (Signed)
 Called patient to see if she is okay and what could I help her with. Lvmtrc. If patient calls back, please get more information.     Copied from CRM #[id number]. Topic: Clinical - Medical Advice >> Aug 04, 2024 10:18 AM Robinson DEL wrote: Reason for CRM:  Calling to speak with provider regarding palliative care and home care, states she has questions regarding her health issues. Issues about care she should she be getting. Only information she would give agent, agent asked if any immediate health concerns and patient stated no. As patient was hanging up she stated Lord help me and starting crying and disconnected call.   Laneta [phone] >> Aug 04, 2024 10:46 AM Rea BROCKS wrote: Patient received a link. She thinks it came from the office and she can't get into Chart. She said she received a link to message her but patient cannot access link. Patient cannot access MyChart to access any links. She would need a phone call.

## 2024-08-04 NOTE — Telephone Encounter (Signed)
 We did not send her any messages via MyChart.  She recently (2 days ago) had an appt w/ Pulmonary so maybe something came from them (AVS, link to bill, appt confirmation, etc).  I am happy to answer any questions provided she is able to give more information.  But if the questions regard the care provided by other physicians, it would be best to ask them directly as I am not able to speak for them and can only give my best guess or interpretation

## 2024-08-05 NOTE — Telephone Encounter (Signed)
 Called and spoke with to informed that message was regarding her up coming appt that she has on 08/11/2023. This was allow her to check in via my chart , pt stated that she wasn't able to access her acct. , she had call to speak with some regarding this had no luck on yesterday. So she doesn't want to use it. I told that I understood. That will keep her updated regarding her appt has 08/10/24 depending upon the weather.

## 2024-08-09 ENCOUNTER — Telehealth: Payer: Self-pay

## 2024-08-09 NOTE — Telephone Encounter (Signed)
Pt called back and I relayed msg

## 2024-08-09 NOTE — Telephone Encounter (Signed)
 Copied from CRM #[id number]. Topic: Clinical - Order For Equipment >> Aug 05, 2024  1:07 PM [name] wrote: Reason for CRM: pt checking supplies she asked for from Dr Kara. Stated they are packs to put on your shoulder. Provided her with DME name and number  [employer] received the order. Pt will need to contact DME company for more info NFN

## 2024-08-10 ENCOUNTER — Ambulatory Visit: Admitting: Family Medicine

## 2024-08-12 ENCOUNTER — Other Ambulatory Visit: Payer: Self-pay | Admitting: Pulmonary Disease

## 2024-08-12 ENCOUNTER — Ambulatory Visit: Payer: Self-pay | Admitting: Pulmonary Disease

## 2024-08-12 NOTE — Telephone Encounter (Signed)
 FYI Only or Action Required?: Action required by provider: update on patient condition.  Patient is followed in Pulmonology for chronic hypoxemic respiratory failure, last seen on 08/02/2024 by Kara Dorn NOVAK, MD.  Called Nurse Triage reporting Shortness of Breath.  Symptoms began several days ago.  Interventions attempted: Home oxygen  use.  Symptoms are: unchanged.  Triage Disposition: Call PCP Now  Patient/caregiver understands and will follow disposition?: Yes   Message from [facility] sent at 08/12/2024 12:53 PM EST  Reason for Triage:  Pt recently had her oxygen  increased to 24-7 at last OV w/[facility] Not having improvement with symptoms Still SOB and not feeling well   Pt of [place], [place]     Reason for Disposition  Oxygen  level (e.g., pulse oximetry) 91 to 94%  Answer Assessment - Initial Assessment Questions Pt called to report continued SOB since appt 08/02/24. Pt states she is wearing O2 at 2L but is having difficulty d/t increased stress with pulling Oxygen  tank around her home and tubing getting caught on everything. Pt states DME for portable tank has been delayed; new expected delivery is today at 4pm. Pt states yesterday O2 was 90 and 95% but she still feels SOB. Pt states she can get air in but has a difficult time getting air back out. Pt reports she has not checked O2 today; recommended she check while on the phone. Pt had to walk through her home so initial readings 85-86% post activity with HR 60 bpm. Pt states she feels the most SOB after walking through her home to get to laundry room. Discussed this is normal and re-iterated Dr. Luann instructions for O2 titration. Discussed at rest pt can wear 2L [place] if asymptomatic, if she is up and moving okay to titrate up to 2.5-3L PRN but to return to 2L at rest. Discussed that DME staff removed pt's water  cup off O2 and no longer has humidified O2 d/t staff stating that she was getting too much O2. Pt worried about  nose bleeds. Discussed parameters and used teach back method as pt was all over the place during the call with instructions and confusion. Pt repeated back, 2L at rest, okay to increase to 2.5-3L when walking if needed then return to 2L when SOB eases. Reassured pt this was correct. Encouraged her to not continuously check O2 levels as this can cause additional stress. Discussed checking in am, at lunch and HS unless she is symptomatic. Discussed goal of 88% at rest and allowing monitor to regulate if she is checking O2 levels immediately after being up and moving around. Pt voiced understanding. Pt stated she is not using nebulizers as she has not had any benefit. Questioned about spirometer and encouraged her to use a couple times a day when sitting for lung health. She voiced appreciation. Encouraged pt to keep clinic updated on DME and any further concerns.       1. MAIN CONCERN OR SYMPTOM: What's your main concern? (e.g., low oxygen  level, breathing difficulty) What question do you have?     Pt called with questions regarding O2 levels and questions about parameters   2. ONSET: When did the symptoms start?      SOB continued from appt 08/02/24  3. OXYGEN  THERAPY:      2-3L O2 via nasal cannula   4.  OXYGEN  EQUIPMENT:  Are you having any trouble with your oxygen  equipment?  (e.g., cannula, mask, tubing, tank, concentrator)     No; pt just worried about settings  and monitoring O2 levels   5. OXYGEN  SATURATION MONITOR:       Goal 88%; pt reports walking through her house then level was 85-86%  6. OXYGEN  LEVEL: What is your reading (oxygen  level) today? What is your usual oxygen  saturation reading? (e.g., 95%)     Yesterday 90% and 95%; pt has not checked O2 today. Checked after walking through the house, pt reports walk is a long distance and O2 85-86%   7. VSS MONITORING: Do you monitor/measure your oxygen  level or vital signs? (e.g., yes, no, measurements are automatically  sent to provider/call center). Document CURRENT and NORMAL BASELINE values if available.       Yes; pt have portal finger probe for monitoring   8. BREATHING DIFFICULTY: Are you having any difficulty breathing? If Yes, ask: How bad is it?  (e.g., none, mild, moderate, severe)      Mild SOB; pt states she can get air in but has difficulty getting air back out   9. OTHER SYMPTOMS: Do you have any other symptoms? (e.g., fever, change in sputum)     No  Protocols used: Oxygen  Monitoring and Hypoxia-A-AH

## 2024-08-18 ENCOUNTER — Ambulatory Visit: Admitting: Neurology

## 2024-08-25 ENCOUNTER — Ambulatory Visit: Admitting: Family Medicine

## 2024-09-26 ENCOUNTER — Ambulatory Visit: Admitting: Cardiology

## 2024-11-07 ENCOUNTER — Ambulatory Visit: Admitting: Pulmonary Disease

## 2024-12-06 ENCOUNTER — Ambulatory Visit (HOSPITAL_COMMUNITY): Admitting: Internal Medicine
